# Patient Record
Sex: Female | Born: 1965
Health system: Southern US, Community
[De-identification: ages and names within clinical notes are randomized; demographics above are authoritative.]

## PROBLEM LIST (undated history)

## (undated) DIAGNOSIS — F419 Anxiety disorder, unspecified: Secondary | ICD-10-CM

## (undated) DIAGNOSIS — R51 Headache: Secondary | ICD-10-CM

## (undated) DIAGNOSIS — Z8719 Personal history of other diseases of the digestive system: Secondary | ICD-10-CM

## (undated) DIAGNOSIS — M797 Fibromyalgia: Secondary | ICD-10-CM

## (undated) DIAGNOSIS — F1190 Opioid use, unspecified, uncomplicated: Secondary | ICD-10-CM

## (undated) DIAGNOSIS — L02214 Cutaneous abscess of groin: Secondary | ICD-10-CM

## (undated) DIAGNOSIS — M1990 Unspecified osteoarthritis, unspecified site: Secondary | ICD-10-CM

## (undated) DIAGNOSIS — K429 Umbilical hernia without obstruction or gangrene: Secondary | ICD-10-CM

## (undated) DIAGNOSIS — F17200 Nicotine dependence, unspecified, uncomplicated: Secondary | ICD-10-CM

## (undated) DIAGNOSIS — E739 Lactose intolerance, unspecified: Secondary | ICD-10-CM

## (undated) DIAGNOSIS — E7800 Pure hypercholesterolemia, unspecified: Secondary | ICD-10-CM

## (undated) DIAGNOSIS — M549 Dorsalgia, unspecified: Secondary | ICD-10-CM

## (undated) DIAGNOSIS — G8929 Other chronic pain: Secondary | ICD-10-CM

## (undated) DIAGNOSIS — I10 Essential (primary) hypertension: Secondary | ICD-10-CM

## (undated) DIAGNOSIS — T4145XA Adverse effect of unspecified anesthetic, initial encounter: Secondary | ICD-10-CM

## (undated) DIAGNOSIS — K589 Irritable bowel syndrome without diarrhea: Secondary | ICD-10-CM

## (undated) DIAGNOSIS — N6310 Unspecified lump in the right breast, unspecified quadrant: Secondary | ICD-10-CM

## (undated) DIAGNOSIS — E669 Obesity, unspecified: Secondary | ICD-10-CM

## (undated) HISTORY — PX: ESOPHAGOGASTRODUODENOSCOPY: SHX1529

## (undated) HISTORY — PX: BACK SURGERY: SHX140

## (undated) HISTORY — DX: Headache: R51

## (undated) HISTORY — DX: Unspecified osteoarthritis, unspecified site: M19.90

## (undated) HISTORY — DX: Nicotine dependence, unspecified, uncomplicated: F17.200

## (undated) HISTORY — PX: LUMBAR DISC SURGERY: SHX700

## (undated) HISTORY — PX: OTHER SURGICAL HISTORY: SHX169

## (undated) HISTORY — DX: Unspecified lump in the right breast, unspecified quadrant: N63.10

## (undated) HISTORY — DX: Essential (primary) hypertension: I10

## (undated) HISTORY — PX: TUBAL LIGATION: SHX77

## (undated) HISTORY — PX: BREAST EXCISIONAL BIOPSY: SUR124

## (undated) HISTORY — PX: FINGER SURGERY: SHX640

## (undated) HISTORY — DX: Irritable bowel syndrome, unspecified: K58.9

## (undated) HISTORY — PX: ABDOMINAL HYSTERECTOMY: SHX81

## (undated) HISTORY — PX: FOOT SURGERY: SHX648

## (undated) HISTORY — DX: Pure hypercholesterolemia, unspecified: E78.00

## (undated) HISTORY — DX: Obesity, unspecified: E66.9

## (undated) HISTORY — DX: Cutaneous abscess of groin: L02.214

## (undated) HISTORY — DX: Dorsalgia, unspecified: M54.9

## (undated) HISTORY — PX: BREAST LUMPECTOMY: SHX2

## (undated) HISTORY — DX: Anxiety disorder, unspecified: F41.9

---

## 1998-06-23 ENCOUNTER — Emergency Department (HOSPITAL_COMMUNITY): Admission: EM | Admit: 1998-06-23 | Discharge: 1998-06-23 | Payer: Self-pay | Admitting: Emergency Medicine

## 1998-10-20 ENCOUNTER — Other Ambulatory Visit: Admission: RE | Admit: 1998-10-20 | Discharge: 1998-10-20 | Payer: Self-pay | Admitting: Obstetrics & Gynecology

## 2001-01-06 ENCOUNTER — Other Ambulatory Visit: Admission: RE | Admit: 2001-01-06 | Discharge: 2001-01-06 | Payer: Self-pay | Admitting: Obstetrics and Gynecology

## 2001-11-11 ENCOUNTER — Ambulatory Visit (HOSPITAL_COMMUNITY): Admission: RE | Admit: 2001-11-11 | Discharge: 2001-11-11 | Payer: Self-pay | Admitting: Pulmonary Disease

## 2001-11-11 ENCOUNTER — Encounter: Payer: Self-pay | Admitting: Pulmonary Disease

## 2002-02-08 ENCOUNTER — Observation Stay (HOSPITAL_COMMUNITY): Admission: EM | Admit: 2002-02-08 | Discharge: 2002-02-09 | Payer: Self-pay | Admitting: Emergency Medicine

## 2002-02-08 ENCOUNTER — Encounter (INDEPENDENT_AMBULATORY_CARE_PROVIDER_SITE_OTHER): Payer: Self-pay

## 2003-05-04 ENCOUNTER — Encounter: Payer: Self-pay | Admitting: Obstetrics and Gynecology

## 2003-05-04 ENCOUNTER — Encounter: Admission: RE | Admit: 2003-05-04 | Discharge: 2003-05-04 | Payer: Self-pay | Admitting: Obstetrics and Gynecology

## 2004-12-30 ENCOUNTER — Emergency Department (HOSPITAL_COMMUNITY): Admission: EM | Admit: 2004-12-30 | Discharge: 2004-12-30 | Payer: Self-pay | Admitting: Emergency Medicine

## 2005-01-04 ENCOUNTER — Encounter (INDEPENDENT_AMBULATORY_CARE_PROVIDER_SITE_OTHER): Payer: Self-pay | Admitting: *Deleted

## 2005-01-04 ENCOUNTER — Ambulatory Visit (HOSPITAL_BASED_OUTPATIENT_CLINIC_OR_DEPARTMENT_OTHER): Admission: RE | Admit: 2005-01-04 | Discharge: 2005-01-04 | Payer: Self-pay | Admitting: *Deleted

## 2005-01-04 ENCOUNTER — Ambulatory Visit (HOSPITAL_COMMUNITY): Admission: RE | Admit: 2005-01-04 | Discharge: 2005-01-04 | Payer: Self-pay | Admitting: *Deleted

## 2005-02-01 ENCOUNTER — Ambulatory Visit: Payer: Self-pay | Admitting: Infectious Diseases

## 2005-02-01 ENCOUNTER — Ambulatory Visit (HOSPITAL_COMMUNITY): Admission: RE | Admit: 2005-02-01 | Discharge: 2005-02-01 | Payer: Self-pay | Admitting: Infectious Diseases

## 2005-02-20 ENCOUNTER — Ambulatory Visit: Payer: Self-pay | Admitting: Infectious Diseases

## 2005-03-09 ENCOUNTER — Emergency Department (HOSPITAL_COMMUNITY): Admission: EM | Admit: 2005-03-09 | Discharge: 2005-03-09 | Payer: Self-pay | Admitting: Emergency Medicine

## 2005-07-03 ENCOUNTER — Ambulatory Visit: Payer: Self-pay | Admitting: Pulmonary Disease

## 2005-10-18 ENCOUNTER — Ambulatory Visit: Payer: Self-pay | Admitting: Pulmonary Disease

## 2006-07-21 ENCOUNTER — Other Ambulatory Visit: Admission: RE | Admit: 2006-07-21 | Discharge: 2006-07-21 | Payer: Self-pay | Admitting: *Deleted

## 2007-05-04 ENCOUNTER — Ambulatory Visit: Payer: Self-pay | Admitting: Pulmonary Disease

## 2007-05-04 LAB — CONVERTED CEMR LAB
ALT: 18 units/L (ref 0–35)
AST: 22 units/L (ref 0–37)
Albumin: 3.8 g/dL (ref 3.5–5.2)
Basophils Absolute: 0 10*3/uL (ref 0.0–0.1)
Basophils Relative: 0.6 % (ref 0.0–1.0)
Creatinine, Ser: 0.8 mg/dL (ref 0.4–1.2)
Crystals: NEGATIVE
Eosinophils Absolute: 0.2 10*3/uL (ref 0.0–0.6)
Eosinophils Relative: 2.1 % (ref 0.0–5.0)
GFR calc Af Amer: 102 mL/min
HDL: 65.7 mg/dL (ref >39.0)
Hemoglobin, Urine: NEGATIVE
Hemoglobin: 13.3 g/dL (ref 12.0–15.0)
Ketones, ur: NEGATIVE mg/dL
Lymphocytes Relative: 24 % (ref 12.0–46.0)
Neutro Abs: 5.1 10*3/uL (ref 1.4–7.7)
Potassium: 4.2 meq/L (ref 3.5–5.1)
RBC / HPF: NONE SEEN
Sodium: 141 meq/L (ref 135–145)
Total CHOL/HDL Ratio: 3.7
Triglycerides: 56 mg/dL (ref 0–149)
Urine Glucose: NEGATIVE mg/dL
Urobilinogen, UA: 0.2 (ref 0.0–1.0)
pH: 7 (ref 5.0–8.0)

## 2007-07-12 ENCOUNTER — Emergency Department (HOSPITAL_COMMUNITY): Admission: EM | Admit: 2007-07-12 | Discharge: 2007-07-12 | Payer: Self-pay | Admitting: Emergency Medicine

## 2007-08-03 DIAGNOSIS — E669 Obesity, unspecified: Secondary | ICD-10-CM | POA: Insufficient documentation

## 2007-08-03 DIAGNOSIS — F411 Generalized anxiety disorder: Secondary | ICD-10-CM | POA: Insufficient documentation

## 2007-12-03 ENCOUNTER — Encounter: Payer: Self-pay | Admitting: Pulmonary Disease

## 2007-12-03 ENCOUNTER — Emergency Department (HOSPITAL_COMMUNITY): Admission: EM | Admit: 2007-12-03 | Discharge: 2007-12-03 | Payer: Self-pay | Admitting: Emergency Medicine

## 2007-12-07 ENCOUNTER — Ambulatory Visit: Payer: Self-pay | Admitting: Pulmonary Disease

## 2007-12-07 DIAGNOSIS — E7800 Pure hypercholesterolemia, unspecified: Secondary | ICD-10-CM | POA: Insufficient documentation

## 2007-12-07 DIAGNOSIS — R51 Headache: Secondary | ICD-10-CM | POA: Insufficient documentation

## 2007-12-07 DIAGNOSIS — R519 Headache, unspecified: Secondary | ICD-10-CM | POA: Insufficient documentation

## 2007-12-07 DIAGNOSIS — M1990 Unspecified osteoarthritis, unspecified site: Secondary | ICD-10-CM | POA: Insufficient documentation

## 2007-12-07 DIAGNOSIS — M5440 Lumbago with sciatica, unspecified side: Secondary | ICD-10-CM | POA: Insufficient documentation

## 2007-12-07 DIAGNOSIS — F17200 Nicotine dependence, unspecified, uncomplicated: Secondary | ICD-10-CM

## 2007-12-09 ENCOUNTER — Encounter: Admission: RE | Admit: 2007-12-09 | Discharge: 2007-12-09 | Payer: Self-pay | Admitting: Orthopedic Surgery

## 2007-12-10 ENCOUNTER — Encounter: Payer: Self-pay | Admitting: Pulmonary Disease

## 2007-12-17 ENCOUNTER — Ambulatory Visit (HOSPITAL_COMMUNITY): Admission: RE | Admit: 2007-12-17 | Discharge: 2007-12-18 | Payer: Self-pay | Admitting: Specialist

## 2008-01-21 ENCOUNTER — Ambulatory Visit (HOSPITAL_BASED_OUTPATIENT_CLINIC_OR_DEPARTMENT_OTHER): Admission: RE | Admit: 2008-01-21 | Discharge: 2008-01-21 | Payer: Self-pay | Admitting: Specialist

## 2008-01-28 ENCOUNTER — Encounter: Admission: RE | Admit: 2008-01-28 | Discharge: 2008-04-27 | Payer: Self-pay | Admitting: Specialist

## 2008-02-16 ENCOUNTER — Other Ambulatory Visit: Admission: RE | Admit: 2008-02-16 | Discharge: 2008-02-16 | Payer: Self-pay | Admitting: Gynecology

## 2008-11-07 ENCOUNTER — Telehealth (INDEPENDENT_AMBULATORY_CARE_PROVIDER_SITE_OTHER): Payer: Self-pay | Admitting: *Deleted

## 2008-12-05 ENCOUNTER — Ambulatory Visit: Payer: Self-pay | Admitting: Pulmonary Disease

## 2008-12-09 ENCOUNTER — Encounter: Admission: RE | Admit: 2008-12-09 | Discharge: 2008-12-09 | Payer: Self-pay | Admitting: Pulmonary Disease

## 2008-12-13 ENCOUNTER — Encounter: Payer: Self-pay | Admitting: Pulmonary Disease

## 2008-12-16 ENCOUNTER — Encounter: Admission: RE | Admit: 2008-12-16 | Discharge: 2008-12-16 | Payer: Self-pay | Admitting: Pulmonary Disease

## 2008-12-19 ENCOUNTER — Encounter: Payer: Self-pay | Admitting: Pulmonary Disease

## 2008-12-19 ENCOUNTER — Encounter: Admission: RE | Admit: 2008-12-19 | Discharge: 2008-12-19 | Payer: Self-pay | Admitting: Pulmonary Disease

## 2008-12-19 LAB — CONVERTED CEMR LAB
AST: 29 units/L (ref 0–37)
Albumin: 3.9 g/dL (ref 3.5–5.2)
Alkaline Phosphatase: 67 units/L (ref 39–117)
Basophils Absolute: 0 10*3/uL (ref 0.0–0.1)
Bilirubin, Direct: 0.1 mg/dL (ref 0.0–0.3)
CO2: 31 meq/L (ref 19–32)
Calcium: 9.1 mg/dL (ref 8.4–10.5)
Chloride: 103 meq/L (ref 96–112)
Cholesterol: 247 mg/dL — ABNORMAL HIGH (ref 0–200)
Creatinine, Ser: 0.8 mg/dL (ref 0.4–1.2)
Direct LDL: 165.9 mg/dL
Eosinophils Absolute: 0.2 10*3/uL (ref 0.0–0.7)
Eosinophils Relative: 2.6 % (ref 0.0–5.0)
HCT: 40.7 % (ref 36.0–46.0)
Monocytes Relative: 7.7 % (ref 3.0–12.0)
Neutrophils Relative %: 58.5 % (ref 43.0–77.0)
Platelets: 264 10*3/uL (ref 150.0–400.0)
Potassium: 4.2 meq/L (ref 3.5–5.1)
RBC: 4.71 M/uL (ref 3.87–5.11)
Sodium: 142 meq/L (ref 135–145)
Total Bilirubin: 0.7 mg/dL (ref 0.3–1.2)
Total Protein: 6.9 g/dL (ref 6.0–8.3)
Triglycerides: 52 mg/dL (ref 0.0–149.0)
WBC: 7.5 10*3/uL (ref 4.5–10.5)

## 2009-12-22 ENCOUNTER — Encounter: Admission: RE | Admit: 2009-12-22 | Discharge: 2009-12-22 | Payer: Self-pay | Admitting: Pulmonary Disease

## 2009-12-29 ENCOUNTER — Encounter: Admission: RE | Admit: 2009-12-29 | Discharge: 2009-12-29 | Payer: Self-pay | Admitting: Pulmonary Disease

## 2009-12-29 ENCOUNTER — Encounter: Payer: Self-pay | Admitting: Pulmonary Disease

## 2010-01-12 ENCOUNTER — Ambulatory Visit: Payer: Self-pay | Admitting: Pulmonary Disease

## 2010-01-12 DIAGNOSIS — N63 Unspecified lump in unspecified breast: Secondary | ICD-10-CM

## 2010-01-13 LAB — CONVERTED CEMR LAB
ALT: 20 units/L (ref 0–35)
Albumin: 4 g/dL (ref 3.5–5.2)
BUN: 7 mg/dL (ref 6–23)
Basophils Relative: 0.7 % (ref 0.0–3.0)
Bilirubin Urine: NEGATIVE
CO2: 29 meq/L (ref 19–32)
Calcium: 8.6 mg/dL (ref 8.4–10.5)
Creatinine, Ser: 0.7 mg/dL (ref 0.4–1.2)
Eosinophils Relative: 3.2 % (ref 0.0–5.0)
HCT: 39.3 % (ref 36.0–46.0)
HDL: 75.7 mg/dL (ref >39.00)
Ketones, ur: NEGATIVE mg/dL
MCHC: 33.5 g/dL (ref 30.0–36.0)
Monocytes Absolute: 0.6 10*3/uL (ref 0.1–1.0)
Monocytes Relative: 7.8 % (ref 3.0–12.0)
Neutro Abs: 4.4 10*3/uL (ref 1.4–7.7)
Sodium: 145 meq/L (ref 135–145)
Total Protein, Urine: NEGATIVE mg/dL
Triglycerides: 50 mg/dL (ref 0.0–149.0)
Urine Glucose: NEGATIVE mg/dL

## 2010-01-15 ENCOUNTER — Encounter: Payer: Self-pay | Admitting: Pulmonary Disease

## 2010-01-22 ENCOUNTER — Telehealth: Payer: Self-pay | Admitting: Pulmonary Disease

## 2010-02-13 ENCOUNTER — Encounter: Admission: RE | Admit: 2010-02-13 | Discharge: 2010-02-13 | Payer: Self-pay | Admitting: General Surgery

## 2010-02-16 ENCOUNTER — Ambulatory Visit (HOSPITAL_BASED_OUTPATIENT_CLINIC_OR_DEPARTMENT_OTHER): Admission: RE | Admit: 2010-02-16 | Discharge: 2010-02-16 | Payer: Self-pay | Admitting: General Surgery

## 2010-02-16 ENCOUNTER — Encounter: Admission: RE | Admit: 2010-02-16 | Discharge: 2010-02-16 | Payer: Self-pay | Admitting: General Surgery

## 2010-02-16 ENCOUNTER — Encounter: Payer: Self-pay | Admitting: Pulmonary Disease

## 2010-03-05 ENCOUNTER — Encounter: Payer: Self-pay | Admitting: Pulmonary Disease

## 2010-09-13 ENCOUNTER — Telehealth: Payer: Self-pay | Admitting: Pulmonary Disease

## 2010-10-18 NOTE — Assessment & Plan Note (Signed)
Summary: cpx/ mbw   CC:  1 year ROV & CPX....  History of Present Illness: 45 y/o BF here for a follow up visit...   ~  ER visit 12/03/07 for acute left flank pain- seen by DrAllen... L Flank Pain x 1day, mod-severe, exam- neg;  labs- norm CBC, norm Chems, Upreg=neg, UA + wbc, bact & +nitrite;  CTAbd & Pelvis was negative... Dana [name].. therefore Dx w/ pyeloneph and Rx w/ dilaudid & zofran in ER... disch on CIPRO...  ~  seen here 12/07/07 w/ pain is no better and, in fact, is more in the left side of her back and is assoc w/ pain and numbness in her left leg... no know injury, no hx of back problems... referred to Ortho- seen by DrDean w/ ?radiculopathy... MRI done w/ L3 disc herniation- s/p lumbar L2-3 microdiscectomy 12/17/07 by Susann Givens and improved...   ~  Mar10:  yearly check up and CPX- doing well, no new complaints or concerns... she never took the BP meds perscribed in 2009 (Lisinopril/Hct), preferring to treat this w/ diet alone...   ~  January 12, 2010:  Yearly ROV & CPX> recent stress w/ f/u mammogram showing enlarging lesion right breast... 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 f/u mammogram 4/11 w/ 1.6 x 1cm lesion w/ lobulation... she is referred to [facility] & awaiting consult for excision 5/2 DrRosenbower... she has lost 20# this yr on diet & walking... BP still borderline controlled on diet alone (she refused meds) w/ BP 130/90 today... she was started on Prav40 for her Chol last yr but she stopped it on her own 2 mo ago... still smoking several cigs per day & not motivated to quit.    Current Problems:  CIGARETTE SMOKER (ICD-305.1) - still smokes several cigarettes per day... we discussed smoking cessation strategies and offered counselling, patches, Chantix, etc... she declines smoking cessation help & does not appear motivated to quit.  HYPERTENSION, MILD (ICD-401.1) - borderline HBP- ?related to her weight... we perscribed LISINOPRIL/Hct  20/12.5 Aug08 but she didn't follow up and stopped this on her own... she prefers to treat this without meds & we discussed diet + exercise needed...  BP=130/90 today, denies HA, fatigue, visual changes, CP, palipit, dizziness, syncope, dyspnea, edema, etc... pt advised to get a BP cuff and start monitoring BP at home, get on diet- 2 gm sodium, 1200 cal/d weight reducing diet, and incease exercise program...  ~  4/11:  BP still  ~130/90 despite 20# wt reduction over the last yr... she declines medRx.  HYPERCHOLESTEROLEMIA (ICD-272.0) - she refused to fill Simva Rx in past but started PRAVACHOL 40mg  ($4 list) 3/10.  ~  FLP 8/08 showed TChol 244, TG 56, HDL 66, LDL 168- Simvastatin recommended, but pt refused.  ~  FLP 3/10 showed TChol 247, TG 52, HDL 71, LDL 166... rec> try Pravastatin40 + diet & exercise.  ~  FLP 4/11 off Prav40 x67mo showed TChol 240, TG 50, HDL 76, LDL158... rec> get bck on Prav40!  OBESITY (ICD-278.00) - diet/ exercise discussed w/ pt (again)... she refuses to consider bariatric approach... in her 5\' 4"  frame her BMI has been around 40...  ~  weight 2/07 = 228#  ~  weight 8/08 = 237#  ~  weight 3/09 = 243#  ~  weight 3/10 = 240#  ~  weight 4/11 = 220#... keep up the good work!  BREAST MASS, RIGHT (ICD-611.72) *** see above ***  DEGENERATIVE JOINT DISEASE (ICD-715.90) BACK PAIN WITH RADICULOPATHY (ICD-729.2) - presented 3/09 w/ left sided back pain into left leg... eval by DrDean & DrNitka w/  L2-3 microdiscectomy 4/09 by Susann Givens...  Hx of HEADACHE (ICD-784.0)  ANXIETY (ICD-300.00) - on ALPRAZOLAM 0.5mg  Tid Prn...   Allergies: 1)  ! Penicillin  Comments:  Nurse/Medical Assistant: The patient's medications and allergies were reviewed with the patient and were updated in the Medication and Allergy Lists.  Past History:  Past Medical History: CIGARETTE SMOKER (ICD-305.1) HYPERTENSION, MILD (ICD-401.1) HYPERCHOLESTEROLEMIA (ICD-272.0) OBESITY (ICD-278.00) BREAST  MASS, RIGHT (ICD-611.72) DEGENERATIVE JOINT DISEASE (ICD-715.90) BACK PAIN WITH RADICULOPATHY (ICD-729.2) Hx of HEADACHE (ICD-784.0) ANXIETY (ICD-300.00)  Past Surgical History: S/P BTL S/P drainage of right groin abscess 5/03 by DrWeatherly S/P excision of mass from right index finger 4/06 by DrMyerdirks S/P L2-3 microdiscectomy 4/09 by Susann Givens  Family History: mother alive and well father alive and hx of DM 4 siblings all alive and well  Social History: current smoker  <1PPD exercise at least 2 times per week no caffeine use married 4 children Employ: Psychologist, educational  Review of Systems       The patient complains of anxiety.  The patient denies fever, chills, sweats, anorexia, fatigue, weakness, malaise, weight loss, sleep disorder, blurring, diplopia, eye irritation, eye discharge, vision loss, eye pain, photophobia, earache, ear discharge, tinnitus, decreased hearing, nasal congestion, nosebleeds, sore throat, hoarseness, chest pain, palpitations, syncope, dyspnea on exertion, orthopnea, PND, peripheral edema, cough, dyspnea at rest, excessive sputum, hemoptysis, wheezing, pleurisy, nausea, vomiting, diarrhea, constipation, change in bowel habits, abdominal pain, melena, hematochezia, jaundice, gas/bloating, indigestion/heartburn, dysphagia, odynophagia, dysuria, hematuria, urinary frequency, urinary hesitancy, nocturia, incontinence, back pain, joint pain, joint swelling, muscle cramps, muscle weakness, stiffness, arthritis, sciatica, restless legs, leg pain at night, leg pain with exertion, rash, itching, dryness, suspicious lesions, paralysis, paresthesias, seizures, tremors, vertigo, transient blindness, frequent falls, frequent headaches, difficulty walking, depression, memory loss, confusion, cold intolerance, heat intolerance, polydipsia, polyphagia, polyuria, unusual weight change, abnormal bruising, bleeding, enlarged lymph nodes, urticaria, allergic rash, hay fever, and  recurrent infections.    Vital Signs:  Patient profile:   45 year old female Height:      62 inches Weight:      220.13 pounds BMI:     40.41 O2 Sat:      99 % on Room air Temp:     99.7 degrees F oral Pulse rate:   70 / minute BP sitting:   130 / 90  (left arm) Cuff size:   regular  Vitals Entered By: Randell Loop CMA (January 12, 2010 11:08 AM)  O2 Sat at Rest %:  99 O2 Flow:  Room air CC: 1 year ROV & CPX... Is Patient Diabetic? No Pain Assessment Patient in pain? yes      Onset of pain  at itmes her bones ache Comments no changes in meds   Physical Exam  Additional Exam:  WD, Obese, 45 y/o BF in NAD... GENERAL:  Alert & oriented; pleasant & cooperative... HEENT:  [place]/AT, EOM-wnl, PERRLA, EACs-clear, TMs-wnl, NOSE-clear, THROAT-clear & wnl. NECK:  Supple w/ fairROM; no JVD; normal carotid impulses w/o bruits; no thyromegaly or nodules palpated; no lymphadenopathy. CHEST:  Clear to P & A; without wheezes/ rales/ or rhonchi. HEART:  Regular Rhythm; without murmurs/ rubs/ or gallops. ABDOMEN:  Obese, soft & nontender; normal bowel sounds; no organomegaly or masses detected. EXT: without deformities, mild arthritic changes; no varicose veins/ +venous insuffic/ no edema. NEURO:  CN's intact;  no focal neuro deficits... DERM:  No lesions noted; no rash etc...    CXR  Procedure date:  01/12/2010  Findings:      CHEST - 2 VIEW Comparison: Chest x-ray of 12/05/2008   Findings: The lungs are clear.  Mediastinal contours are stable. The heart is within normal limits in size.  No acute bony abnormality is seen with only mild degenerative change in the thoracic spine.   IMPRESSION: Stable chest x-ray.  No active lung disease.   Read By:  Juline Patch,  M.D.   EKG  Procedure date:  01/12/2010  Findings:      Normal sinus rhythm with rate of:  64/min... Tracing IVCD, NSSTTWA, NAD...  SN   MISC. Report  Procedure date:  01/12/2010  Findings:      BMP  (METABOL)   Sodium                    145 mEq/L                   135-145   Potassium                 4.3 mEq/L                   3.5-5.1   Chloride                  110 mEq/L                   96-112   Carbon Dioxide            29 mEq/L                    19-32   Glucose                   77 mg/dL                    11-91   BUN                       7 mg/dL                     4-78   Creatinine                0.7 mg/dL                   2.9-5.6   Calcium                   8.6 mg/dL                   2.1-30.8   GFR                       116.72 mL/min               >60  Hepatic/Liver Function Panel (HEPATIC)   Total Bilirubin           0.4 mg/dL                   6.5-7.8   Direct Bilirubin          0.1 mg/dL                   4.6-9.6   Alkaline Phosphatase  62 U/L                      39-117   AST                       22 U/L                      0-37   ALT                       20 U/L                      0-35   Total Protein             6.8 g/dL                    1.6-1.0   Albumin                   4.0 g/dL                    9.6-0.4  CBC Platelet w/Diff (CBCD)   White Cell Count          7.3 K/uL                    4.5-10.5   Red Cell Count            4.44 Mil/uL                 3.87-5.11   Hemoglobin                13.1 g/dL                   54.0-98.1   Hematocrit                39.3 %                      36.0-46.0   MCV                       88.4 fl                     78.0-100.0   Platelet Count            251.0 K/uL                  150.0-400.0   Neutrophil %              60.8 %                      43.0-77.0   Lymphocyte %              27.5 %                      12.0-46.0   Monocyte %                7.8 %                       3.0-12.0   Eosinophils%              3.2 %  0.0-5.0   Basophils %               0.7 %                       0.0-3.0  Comments:      Lipid Panel (LIPID)   Cholesterol          [H]  240 mg/dL                   8-119    Triglycerides             50.0 mg/dL                  1.4-782.9   HDL                       56.21 mg/dL                 >30.86 Cholesterol LDL - Direct                             158.2 mg/dL  TSH (TSH)   FastTSH                   0.89 uIU/mL                 0.35-5.50  UDip w/Micro (URINE)   Color                     LT. YELLOW   Clarity                   CLEAR                       Clear   Specific Gravity          >=1.030                     1.000 - 1.030   Urine Ph                  5.5                         5.0-8.0   Protein                   NEGATIVE                    Negative   Urine Glucose             NEGATIVE                    Negative   Ketones                   NEGATIVE                    Negative   Urine Bilirubin           NEGATIVE                    Negative   Blood                     NEGATIVE  Negative   Urobilinogen              0.2                         0.0 - 1.0   Leukocyte Esterace        NEGATIVE                    Negative   Nitrite                   NEGATIVE                    Negative   Urine WBC                 0-2/hpf                     0-2/hpf   Urine RBC                 0-2/hpf                     0-2/hpf   Urine Mucus               Presence of                 None   Urine Epith               Few(5-10/hpf)               Rare(0-4/hpf)  Impression & Recommendations:  Problem # 1:  BREAST MASS, RIGHT (ICD-611.72) This is the most concerning issue at present>  consult from [facility]- DrRosenbower 01/15/10. Her CXR is clear- NAD...  Problem # 2:  CIGARETTE SMOKER (ICD-305.1) We discussed smoking cessation strategies including cessation programs, counselling, nicotine replacement, and Chantix receptor blockade... the pt is not interested at this time but we left the door open should she like to reconsider at any time.   Problem # 3:  HYPERTENSION, MILD (ICD-401.1) She has borderline HBP>  she has restricted sodium, & lost some weight... she is  not inclined to take Rx for BP at this time... advised continue diet, exercise, get weight down...  Problem # 4:  HYPERCHOLESTEROLEMIA (ICD-272.0) She stopped her Prav40... this FLP is another baseline value & she is rec to restart the Prav40 & stay on this w/ f/u FLP on the medication. Her updated medication list for this problem includes:    Pravastatin Sodium 40 Mg Tabs (Pravastatin sodium) .Dana [name]... Take 1 tab by mouth at bedtime...  Problem # 5:  OBESITY (ICD-278.00) She has lost 20#>  keep up the good work...  Problem # 6:  ANXIETY (ICD-300.00) We will refill her Alpraz Rx... Her updated medication list for this problem includes:    Alprazolam 0.5 Mg Tabs (Alprazolam) .Dana [name]... 1 tab by mouth three times a day as needed for nerves...  Problem # 7:  OTHER MEDICAL PROBLEMS AS NOTED>>>  Complete Medication List: 1)  Pravastatin Sodium 40 Mg Tabs (Pravastatin sodium) .... Take 1 tab by mouth at bedtime.Dana KitchenMarland [name] 2)  Alprazolam 0.5 Mg Tabs (Alprazolam) .Dana [name].. 1 tab by mouth three times a day as needed for nerves...  Other Orders: T-2 View CXR (71020TC) 12 Lead EKG (12 Lead EKG) TLB-BMP (Basic Metabolic Panel-BMET) (80048-METABOL) TLB-Hepatic/Liver Function Pnl (80076-HEPATIC) TLB-CBC Platelet - w/Differential (85025-CBCD) TLB-Lipid Panel (80061-LIPID) TLB-TSH (Thyroid Stimulating Hormone) (84443-TSH) TLB-Udip  w/ Micro (81001-URINE)  Patient Instructions: 1)  Today we updated your med list- see below.... 2)  We refilled your meds for 2011... 3)  Today we did your follow up CXR, EKG, Fasting blood work... please call the "phone tree" in a few days for your lab results.Dana KitchenMarland [name] 4)  Good luck on the up-coming surgical eval... 5)  Keep up the good work w/ weight reduction, and try to quit the last of those nasty cigarettes.Dana KitchenMarland [name] 6)  Call for any questions.Dana KitchenMarland [name] 7)  Please schedule a follow-up appointment in 1 year, sooner as needed. Prescriptions: ALPRAZOLAM 0.5 MG  TABS (ALPRAZOLAM) 1 tab by mouth three  times a day as needed for nerves...  #90 x 6   Entered and Authorized by:   Michele Mcalpine MD   Signed by:   Michele Mcalpine MD on 01/12/2010   Method used:   Print then Give to Patient   RxID:   [id number] PRAVASTATIN SODIUM 40 MG TABS (PRAVASTATIN SODIUM) take 1 tab by mouth at bedtime...  #90 x prn   Entered and Authorized by:   Michele Mcalpine MD   Signed by:   Michele Mcalpine MD on 01/12/2010   Method used:   Print then Give to Patient   RxID:   [id number]     CardioPerfect ECG  ID: [id number] [name], Dana [name] DOB: [birth year] Age: 45 Years Old Sex: Female Race: Black Physician: Oronde Hallenbeck Technician: Randell Loop CMA Height: 62 Weight: 220.13 Status: Unconfirmed Past Medical History:  CIGARETTE SMOKER (ICD-305.1) HYPERTENSION, MILD (ICD-401.1) HYPERCHOLESTEROLEMIA (ICD-272.0) OBESITY (ICD-278.00) DEGENERATIVE JOINT DISEASE (ICD-715.90) BACK PAIN WITH RADICULOPATHY (ICD-729.2) Hx of HEADACHE (ICD-784.0) ANXIETY (ICD-300.00)   Recorded: 01/12/2010 11:23 AM P/PR: 102 ms / 170 ms - Heart rate (maximum exercise) QRS: 82 QT/QTc/QTd: 420 ms / 425 ms / 32 ms - Heart rate (maximum exercise)  P/QRS/T axis: 37 deg / 77 deg / 30 deg - Heart rate (maximum exercise)  Heartrate: 63 bpm  Interpretation:  Normal sinus rhythm with rate of:  64/min... Tracing IVCD, NSSTTWA, NAD...  SN

## 2010-10-18 NOTE — Letter (Signed)
Summary: [facility]   Imported By: Lester  04/18/2010 07:32:15  _____________________________________________________________________  External Attachment:    Type:   Image     Comment:   External Document

## 2010-10-18 NOTE — Letter (Signed)
Summary: [facility]   Imported By: Sherian Rein 02/15/2010 14:50:52  _____________________________________________________________________  External Attachment:    Type:   Image     Comment:   External Document

## 2010-10-18 NOTE — Progress Notes (Signed)
Summary: Sick   Phone Note Call from Patient Call back at [phone]   Caller: Patient Call For: Kriste Basque Summary of Call: Pt c/o Sore throat, ear ache, watery eyes with "mucus like" discharge in the mornings stuffy nose, nausea, bodyaches, sweats, productive cough with thin green mucus starting Sat while at the beach. Pt denies fever, v/d/s. Pt requesting medication called in.  [facility] [address] Initial call taken by: Zackery Barefoot CMA,  September 13, 2010 12:02 PM  Follow-up for Phone Call        per SN---can have zpak #1  take as directed  and use mucinex 600mg   2 by mouth two times a day with plenty of fluids and cough drops and nasal saline spray as needed  called and spoke with pt  and she is aware of recs per SN .Randell Loop CMA  September 13, 2010 1:45 PM     New/Updated Medications: ZITHROMAX Z-PAK 250 MG TABS (AZITHROMYCIN) take as directed Prescriptions: ZITHROMAX Z-PAK 250 MG TABS (AZITHROMYCIN) take as directed  #1 x 0   Entered by:   Randell Loop CMA   Authorized by:   Michele Mcalpine MD   Signed by:   Randell Loop CMA on 09/13/2010   Method used:   [facility] to        [facility] [phone]* (retail)       [address]       [place], [place]  [address]       Ph: [phone] or [phone]       Fax: [phone]   RxID:   [id number]

## 2010-10-18 NOTE — Progress Notes (Signed)
Summary: results  Phone Note Call from Patient Call back at [phone]   Caller: Patient Call For: Celestine Bougie Summary of Call: calling to get lab results Initial call taken by: Rickard Patience,  Jan 22, 2010 10:12 AM  Follow-up for Phone Call        [facility]. results recorded on phone tree. Carron Curie CMA  Jan 22, 2010 10:44 AM  LMOMTCBx2.  Aundra Millet Reynolds LPN  Jan 23, 2010 9:00 AM   LMTCBx3.Carron Curie CMA  Jan 24, 2010 9:47 AM  per protocol will sign off an await pt to return call. Carron Curie CMA  Jan 25, 2010 11:14 AM

## 2010-11-16 ENCOUNTER — Telehealth (INDEPENDENT_AMBULATORY_CARE_PROVIDER_SITE_OTHER): Payer: Self-pay | Admitting: *Deleted

## 2010-11-16 ENCOUNTER — Other Ambulatory Visit: Payer: Self-pay | Admitting: Pulmonary Disease

## 2010-11-25 ENCOUNTER — Emergency Department (HOSPITAL_COMMUNITY)
Admission: EM | Admit: 2010-11-25 | Discharge: 2010-11-25 | Disposition: A | Discharge status: Home / Self Care | Payer: 59 | Attending: Emergency Medicine | Admitting: Emergency Medicine

## 2010-11-25 ENCOUNTER — Emergency Department (HOSPITAL_COMMUNITY): Payer: 59

## 2010-11-25 DIAGNOSIS — R109 Unspecified abdominal pain: Secondary | ICD-10-CM | POA: Insufficient documentation

## 2010-11-25 DIAGNOSIS — I10 Essential (primary) hypertension: Secondary | ICD-10-CM | POA: Insufficient documentation

## 2010-11-25 LAB — POCT I-STAT, CHEM 8
BUN: 9 mg/dL (ref 6–23)
Glucose, Bld: 81 mg/dL (ref 70–99)
HCT: 43 % (ref 36.0–46.0)
Potassium: 4.3 mEq/L (ref 3.5–5.1)
TCO2: 24 mmol/L (ref 0–100)

## 2010-11-25 LAB — URINE MICROSCOPIC-ADD ON

## 2010-11-25 LAB — WET PREP, GENITAL
Trich, Wet Prep: NONE SEEN
Yeast Wet Prep HPF POC: NONE SEEN

## 2010-11-25 LAB — URINALYSIS, ROUTINE W REFLEX MICROSCOPIC: Nitrite: NEGATIVE

## 2010-11-25 LAB — POCT PREGNANCY, URINE: Preg Test, Ur: NEGATIVE

## 2010-11-27 LAB — GC/CHLAMYDIA PROBE AMP, GENITAL
Chlamydia, DNA Probe: NEGATIVE
GC Probe Amp, Genital: NEGATIVE

## 2010-11-27 NOTE — Progress Notes (Signed)
Summary: breast surgery / referral > OV scheduled  Phone Note Call from Patient   Caller: Patient Call For: nadel Summary of Call: pt says she was told to ask dr Kriste Basque for a referral to the breast center. pt says she has tissue  that needs to be removed in one of her breasts. pt # [id number] Initial call taken by: Tivis Ringer, CNA,  November 16, 2010 4:05 PM  Follow-up for Phone Call        [facility] Vernie Murders  November 16, 2010 4:45 PM  Pt c/o right breast tenderness since having tumor removed February 16, 2010. Pt states she called the [facility] to have routine mammogram done and was told by them that she will have to call our office first. Pt states skin is intact but is unable to tolerated anything touching it, not even water. Please advise. Thanks. Zackery Barefoot CMA  November 19, 2010 4:30 PM     Additional Follow-up for Phone Call Additional follow up Details #1::        Per SN, pt will need OV.    Called, spoke with pt.  Requesting OV on a Friday.  OV scheduled with TP for 11/30/10 at 12pm -- pt aware. Gweneth Dimitri RN  November 19, 2010 5:40 PM

## 2010-11-30 ENCOUNTER — Other Ambulatory Visit: Payer: 59

## 2010-11-30 ENCOUNTER — Other Ambulatory Visit: Payer: Self-pay | Admitting: Adult Health

## 2010-11-30 ENCOUNTER — Encounter: Payer: Self-pay | Admitting: Adult Health

## 2010-11-30 ENCOUNTER — Ambulatory Visit (INDEPENDENT_AMBULATORY_CARE_PROVIDER_SITE_OTHER): Payer: 59 | Admitting: Adult Health

## 2010-11-30 DIAGNOSIS — N644 Mastodynia: Secondary | ICD-10-CM

## 2010-11-30 DIAGNOSIS — R3129 Other microscopic hematuria: Secondary | ICD-10-CM | POA: Insufficient documentation

## 2010-11-30 DIAGNOSIS — N3000 Acute cystitis without hematuria: Secondary | ICD-10-CM

## 2010-11-30 LAB — URINALYSIS, ROUTINE W REFLEX MICROSCOPIC
Bilirubin Urine: NEGATIVE
Hgb urine dipstick: NEGATIVE
Total Protein, Urine: NEGATIVE
Urine Glucose: NEGATIVE
Urobilinogen, UA: 0.2 (ref 0.0–1.0)

## 2010-12-03 ENCOUNTER — Other Ambulatory Visit: Payer: Self-pay | Admitting: Pulmonary Disease

## 2010-12-03 DIAGNOSIS — Z1231 Encounter for screening mammogram for malignant neoplasm of breast: Secondary | ICD-10-CM

## 2010-12-03 LAB — CBC
HCT: 36.8 % (ref 36.0–46.0)
Hemoglobin: 12.4 g/dL (ref 12.0–15.0)
MCV: 88.1 fL (ref 78.0–100.0)
RBC: 4.18 MIL/uL (ref 3.87–5.11)
WBC: 9 10*3/uL (ref 4.0–10.5)

## 2010-12-03 LAB — COMPREHENSIVE METABOLIC PANEL
Albumin: 3.6 g/dL (ref 3.5–5.2)
Alkaline Phosphatase: 61 U/L (ref 39–117)
BUN: 13 mg/dL (ref 6–23)
CO2: 27 mEq/L (ref 19–32)
Chloride: 103 mEq/L (ref 96–112)
GFR calc non Af Amer: >60 mL/min (ref >60)
Glucose, Bld: 89 mg/dL (ref 70–99)
Potassium: 4.1 mEq/L (ref 3.5–5.1)
Total Bilirubin: 0.5 mg/dL (ref 0.3–1.2)

## 2010-12-03 LAB — PROTIME-INR: Prothrombin Time: 12.9 seconds (ref 11.6–15.2)

## 2010-12-03 LAB — DIFFERENTIAL
Basophils Absolute: 0.1 10*3/uL (ref 0.0–0.1)
Basophils Relative: 1 % (ref 0–1)
Monocytes Absolute: 0.6 10*3/uL (ref 0.1–1.0)
Neutro Abs: 6 10*3/uL (ref 1.7–7.7)
Neutrophils Relative %: 66 % (ref 43–77)

## 2010-12-04 NOTE — Assessment & Plan Note (Signed)
Summary: breast tenderness / cj   CC:  right breast tenderness/occ sharp pain around areola since tumer removal 02/2010: tender to touch, unable to bear water touching.  denies redness, and or warmth to touch.  History of Present Illness: 45 y/o BF with known hx of Hyperlipidemia   November 30, 2010 --Presents for work in visit. Complains of  right breast tenderness/occ sharp pain around areola since tumor removal 02/2010: tender to touch, unable to bear water touching.   Had benign tumor removed. Along scar it is very sensitive. No redness or draingae. NO masses noted. Due for mammogram now. Denies chest pain, dyspnea, orthopnea, hemoptysis, fever, n/v/d, edema, headache.  Medications Prior to Update: 1)  Pravastatin Sodium 40 Mg Tabs (Pravastatin Sodium) .... Take 1 Tab By Mouth At Bedtime.Marland KitchenMarland Kitchen 2)  Alprazolam 0.5 Mg  Tabs (Alprazolam) .[name].. 1 Tab By Mouth Three Times A Day As Needed For Nerves... 3)  Zithromax Z-Pak 250 Mg Tabs (Azithromycin) .... Take As Directed  Allergies (verified): 1)  ! Penicillin  Past History:  Past Medical History: Last updated: 01/12/2010 CIGARETTE SMOKER (ICD-305.1) HYPERTENSION, MILD (ICD-401.1) HYPERCHOLESTEROLEMIA (ICD-272.0) OBESITY (ICD-278.00) BREAST MASS, RIGHT (ICD-611.72) DEGENERATIVE JOINT DISEASE (ICD-715.90) BACK PAIN WITH RADICULOPATHY (ICD-729.2) Hx of HEADACHE (ICD-784.0) ANXIETY (ICD-300.00)  Past Surgical History: Last updated: 01/12/2010 S/P BTL S/P drainage of right groin abscess 5/03 by DrWeatherly S/P excision of mass from right index finger 4/06 by DrMyerdirks S/P L2-3 microdiscectomy 4/09 by Susann Givens  Family History: Last updated: 01/12/2010 mother alive and well father alive and hx of DM 4 siblings all alive and well  Social History: Last updated: 11/30/2010 current smoker  < 0.25 PPD exercise at least 2 times per week no caffeine use married 4 children Employ: [employer] declines flu shot 3.16.12  Risk  Factors: Smoking Status: current (08/03/2007)  Social History: current smoker  < 0.25 PPD exercise at least 2 times per week no caffeine use married 4 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  denies redness, or warmth to touch Is Patient Diabetic? No Comments Medications reviewed with patient Daytime contact number verified with patient. Boone Master CNA/MA  November 30, 2010 12:37 PM    Physical Exam  Additional Exam:  WD, Obese, 44 y/o BF in NAD... GENERAL:  Alert & oriented; pleasant & cooperative... HEENT:  [place]/AT, EOM-wnl, PERRLA, EACs-clear, TMs-wnl, NOSE-clear, THROAT-clear & wnl. NECK:  Supple w/ fairROM; no JVD; normal carotid impulses w/o bruits; no thyromegaly or nodules palpated; no lymphadenopathy. CHEST:  Clear to P & A; without wheezes/ rales/ or rhonchi. HEART:  Regular Rhythm; without murmurs/ rubs/ or gallops. BREAST : large pendulous breast, left with small healed surgical scar along bottom of breast, with no rednesss, massess/lesions noted. tender to touch, right normal  no  adenopathy noted.  ABDOMEN:  Obese, soft & nontender; normal bowel sounds; no organomegaly or masses detected. EXT: without deformities, mild arthritic changes; no varicose veins/ +venous insuffic/ no edema. N    Impression & Recommendations:  Problem # 1:  BREAST PAIN, RIGHT (ICD-611.71)  s/p benign mass removal in 2011, with residual pain along surgical  scar.  suspect area is hypersensitive , exam unremarkable.  plan  Warm heat to area as needed  Decrease caffeine.  Tylenol /advil as needed  OTC capsacin cream to area as needed  Please contact office for sooner follow up if symptoms do not improve or worsen  follow up for mammogram as planned.  follow up Dr. Kriste Basque in 3 months and as needed  follow up for mamogramm.   Orders: Est. Patient Level IV (30865)  Problem # 2:  MICROSCOPIC HEMATURIA (ICD-599.72) udip in er was positive for microscopic hematuria  repeat ua today.   Other Orders: T-Urine Culture (Spectrum Order) [id number]) TLB-Udip w/ Micro (81001-URINE)  Patient Instructions: 1)  I will call with lab results 2)  Warm heat to area as needed  3)  Decrease caffeine.  4)  Tylenol /advil as needed  5)  OTC capsacin cream to area as needed  6)  Please contact office for sooner follow up if symptoms do not improve or worsen  7)  follow up for mammogram as planned.  8)  follow up Dr. Kriste Basque in 3 months and as needed     Appended Document: breast tenderness / cj

## 2010-12-24 ENCOUNTER — Ambulatory Visit
Admission: RE | Admit: 2010-12-24 | Discharge: 2010-12-24 | Disposition: A | Discharge status: Home / Self Care | Payer: 59 | Source: Ambulatory Visit | Attending: Pulmonary Disease | Admitting: Pulmonary Disease

## 2010-12-24 DIAGNOSIS — Z1231 Encounter for screening mammogram for malignant neoplasm of breast: Secondary | ICD-10-CM

## 2011-01-09 ENCOUNTER — Encounter: Payer: Self-pay | Admitting: Pulmonary Disease

## 2011-01-11 ENCOUNTER — Encounter: Payer: Self-pay | Admitting: Pulmonary Disease

## 2011-01-11 ENCOUNTER — Other Ambulatory Visit (INDEPENDENT_AMBULATORY_CARE_PROVIDER_SITE_OTHER): Payer: 59 | Admitting: Pulmonary Disease

## 2011-01-11 ENCOUNTER — Ambulatory Visit (INDEPENDENT_AMBULATORY_CARE_PROVIDER_SITE_OTHER)
Admission: RE | Admit: 2011-01-11 | Discharge: 2011-01-11 | Disposition: A | Discharge status: Home / Self Care | Payer: 59 | Source: Ambulatory Visit | Attending: Pulmonary Disease | Admitting: Pulmonary Disease

## 2011-01-11 ENCOUNTER — Ambulatory Visit (INDEPENDENT_AMBULATORY_CARE_PROVIDER_SITE_OTHER): Payer: 59 | Admitting: Pulmonary Disease

## 2011-01-11 ENCOUNTER — Other Ambulatory Visit (INDEPENDENT_AMBULATORY_CARE_PROVIDER_SITE_OTHER): Payer: 59

## 2011-01-11 VITALS — BP 128/82 | HR 84 | Temp 98.2°F | Ht 62.0 in | Wt 232.4 lb

## 2011-01-11 DIAGNOSIS — E7800 Pure hypercholesterolemia, unspecified: Secondary | ICD-10-CM

## 2011-01-11 DIAGNOSIS — F17200 Nicotine dependence, unspecified, uncomplicated: Secondary | ICD-10-CM

## 2011-01-11 DIAGNOSIS — E669 Obesity, unspecified: Secondary | ICD-10-CM

## 2011-01-11 DIAGNOSIS — N63 Unspecified lump in unspecified breast: Secondary | ICD-10-CM

## 2011-01-11 DIAGNOSIS — F411 Generalized anxiety disorder: Secondary | ICD-10-CM

## 2011-01-11 DIAGNOSIS — Z0000 Encounter for general adult medical examination without abnormal findings: Secondary | ICD-10-CM

## 2011-01-11 DIAGNOSIS — M25569 Pain in unspecified knee: Secondary | ICD-10-CM

## 2011-01-11 DIAGNOSIS — M1990 Unspecified osteoarthritis, unspecified site: Secondary | ICD-10-CM

## 2011-01-11 DIAGNOSIS — I10 Essential (primary) hypertension: Secondary | ICD-10-CM

## 2011-01-11 LAB — CBC WITH DIFFERENTIAL/PLATELET
Basophils Absolute: 0 10*3/uL (ref 0.0–0.1)
Eosinophils Absolute: 0.2 10*3/uL (ref 0.0–0.7)
Lymphocytes Relative: 27.4 % (ref 12.0–46.0)
MCHC: 33.3 g/dL (ref 30.0–36.0)
Neutrophils Relative %: 60.1 % (ref 43.0–77.0)
RDW: 14 % (ref 11.5–14.6)

## 2011-01-11 LAB — BASIC METABOLIC PANEL
CO2: 26 mEq/L (ref 19–32)
Chloride: 108 mEq/L (ref 96–112)
Creatinine, Ser: 0.8 mg/dL (ref 0.4–1.2)

## 2011-01-11 LAB — URINALYSIS, ROUTINE W REFLEX MICROSCOPIC
Hgb urine dipstick: NEGATIVE
Nitrite: NEGATIVE
Urobilinogen, UA: 0.2 (ref 0.0–1.0)

## 2011-01-11 LAB — HEPATIC FUNCTION PANEL
ALT: 16 U/L (ref 0–35)
Alkaline Phosphatase: 64 U/L (ref 39–117)
Bilirubin, Direct: 0.1 mg/dL (ref 0.0–0.3)
Total Protein: 6.9 g/dL (ref 6.0–8.3)

## 2011-01-11 LAB — LIPID PANEL
Cholesterol: 233 mg/dL — ABNORMAL HIGH (ref 0–200)
Total CHOL/HDL Ratio: 3

## 2011-01-11 MED ORDER — ALPRAZOLAM 0.5 MG PO TABS: ORAL_TABLET | ORAL | Status: DC

## 2011-01-11 MED ORDER — PRAVASTATIN SODIUM 40 MG PO TABS: 40.0000 mg | ORAL_TABLET | Freq: Every day | ORAL | Status: DC

## 2011-01-11 NOTE — Progress Notes (Signed)
Subjective:    Patient ID: Dana [name], female    DOB: [birth year], 45 y.o.   MRN: [id number]  HPI 45 y/o BF here for a follow up visit...  ~  January 12, 2010:  Yearly ROV & CPX> recent stress w/ f/u mammogram showing enlarging lesion right breast... had sm nodule (~89mm) at 6 o'clock position right breast 4/10 w/ core biopsy = fibroadenoma... f/u mammogram 4/11 w/ 1.6 x 1cm lesion w/ lobulation... she is referred to [facility] & awaiting consult for excision 5/2 DrRosenbower... she has lost 20# this yr on diet & walking... BP still borderline controlled on diet alone (she refused meds) w/ BP 130/90 today... she was started on Prav40 for her Chol last yr but she stopped it on her own 2 mo ago... still smoking several cigs per day & not motivated to quit.  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         Problem List:  CIGARETTE SMOKER (ICD-305.1) - still smokes several cigarettes per day (~1/4ppd she says)... we discussed smoking cessation strategies and offered counselling, patches, Chantix, etc... she declines smoking cessation help & does not appear motivated to quit... She denies cough, sputum, hemoptysis, SOB, etc... ~  CXR 4/12 showed clear lungs, WNL...  HYPERTENSION, MILD (ICD-401.1) - borderline HBP- ?related to her weight... we prev prescribed LisinoprilL/Hct  Aug08 but she didn't follow up and stopped this on her  own... she prefers to treat this without meds & we discussed diet + exercise needed...   ~  4/11:  BP=130/90> denies HA, fatigue, visual changes, CP, palipit, dizziness, syncope, dyspnea, edema, etc; pt advised to get a BP cuff and start monitoring BP at home, get on diet- 2 gm sodium, 1200 cal/d weight reducing diet, and incease exercise program... ~  4/12:  BP still ~128/82 despite 12# wt gain over the last yr... she declines medRx.  HYPERCHOLESTEROLEMIA (ICD-272.0) - she refused to fill Simva Rx in past & was rec to take Prav40 but she has refused all meds for Chol. ~  FLP 8/08 showed TChol 244, TG 56, HDL 66, LDL 168- Simvastatin recommended, but pt refused. ~  FLP 3/10 showed TChol 247, TG 52, HDL 71, LDL 166... rec> try Pravastatin40 + diet & exercise. ~  FLP 4/11 off Prav40 x12mo showed TChol 240, TG 50, HDL 76, LDL158... rec> get bck on Prav40 (she never did) ~  FLP 4/12 on diet alone showed TChol 233, TG 30, HDL 72, LDL 154... She is rec to take the statin, but she prefers diet alone...  OBESITY (ICD-278.00) - diet/ exercise discussed w/ pt (again)... she refuses to consider bariatric approach... in her 5\' 4"  frame her BMI has been around 40... ~  weight 2/07 = 228# ~  weight 8/08 = 237# ~  weight 3/09 = 243# ~  weight 3/10 = 240# ~  weight 4/11 = 220# ~  weight 4/12 = 232#  BREAST MASS, RIGHT (ICD-611.72) - benign fibroadenoma removed by DrRosenbower 6/11... ~  4/12: she had a f/u mammogram at [facility]= neg...  DEGENERATIVE JOINT DISEASE (ICD-715.90) BACK PAIN WITH RADICULOPATHY (ICD-729.2) - presented 3/09 w/ left sided back pain into left leg... eval by DrDean & DrNitka w/  L2-3 microdiscectomy 4/09 by Susann Givens...  Hx of HEADACHE (ICD-784.0)  ANXIETY (ICD-300.00) - on ALPRAZOLAM 0.5mg  Tid Prn...   Past Surgical History  Procedure Date  . Tubal ligation   . Lumbar disc surgery     Outpatient Encounter Prescriptions as of 01/11/2011  Medication Sig Dispense Refill  .  pravastatin (PRAVACHOL) 40 MG tablet Take 40 mg by mouth daily.        [name] ALPRAZolam (XANAX) 0.5 MG tablet 1/2 to 1 tablet three times daily as needed for nerves         Allergies  Allergen Reactions  . Penicillins     REACTION: itching and swelling    Review of Systems        The patient complains of anxiety.  The patient denies fever, chills, sweats, anorexia, fatigue, weakness, malaise, weight loss, sleep disorder, blurring, diplopia, eye irritation, eye discharge, vision loss, eye pain, photophobia, earache, ear discharge, tinnitus, decreased hearing, nasal congestion, nosebleeds, sore throat, hoarseness, chest pain, palpitations, syncope, dyspnea on exertion, orthopnea, PND, peripheral edema, cough, dyspnea at rest, excessive sputum, hemoptysis, wheezing, pleurisy, nausea, vomiting, diarrhea, constipation, change in bowel habits, abdominal pain, melena, hematochezia, jaundice, gas/bloating, indigestion/heartburn, dysphagia, odynophagia, dysuria, hematuria, urinary frequency, urinary hesitancy, nocturia, incontinence, back pain, joint pain, joint swelling, muscle cramps, muscle weakness, stiffness, arthritis, sciatica, restless legs, leg pain at night, leg pain with exertion, rash, itching, dryness, suspicious lesions, paralysis, paresthesias, seizures, tremors, vertigo, transient blindness, frequent falls, frequent headaches, difficulty walking, depression, memory loss, confusion, cold intolerance, heat intolerance, polydipsia, polyphagia, polyuria, unusual weight change, abnormal bruising, bleeding, enlarged lymph nodes, urticaria, allergic rash, hay fever, and recurrent infections.     Objective:   Physical Exam     WD, Obese, 44 y/o BF in NAD... GENERAL:  Alert & oriented; pleasant & cooperative... HEENT:  [place]/AT, EOM-wnl, PERRLA, EACs-clear, TMs-wnl, NOSE-clear, THROAT-clear & wnl. NECK:  Supple w/ fairROM; no JVD; normal carotid impulses w/o bruits; no thyromegaly or nodules palpated; no  lymphadenopathy. CHEST:  Clear to P & A; without wheezes/ rales/ or rhonchi. HEART:  Regular Rhythm; without murmurs/ rubs/ or gallops. ABDOMEN:  Obese, soft & nontender; normal bowel sounds; no organomegaly or masses detected. EXT: without deformities, mild arthritic changes; no varicose veins/ +venous insuffic/ no edema. NEURO:  CN's intact;  no focal neuro deficits... DERM:  No lesions noted; no rash etc...   Assessment & Plan:   CPX>  CXR & labs reviewed> main finding is abn FLP w/ incr TChol & LDL as above;  rec to take Statin Rx & her best option is Prav40 on the $4 list;  She declines all meds for Chol & prefers diet alone, so we reviewed diet & exercise needed to lose wt & improve the lipid profile, she understands the risks of CAD & stroke... Must quit smoking as well!  HBP>  Borderline, again she doesn't want meds, so improvement will be depend on diet wt reduction...  CHOL>  As above, refuses meds, we reviewed diet...  Obesity>  Ditto, everything is dependent on her losing weight...  DJD>  She will f/u w/  DrDean & Susann Givens, her Orthopedists...   Anxiety>  On Alprazolam prn.Marland KitchenMarland Kitchen

## 2011-01-11 NOTE — Patient Instructions (Signed)
Today we updated your meds in our new EPIC system...    We refilled your Alprazolam & Pravastatin prescriptions...  Today we did your follow upCXR & fasting blood work...    Please call the [facility] in a few days for your results...    Dial [id number] & when prompted enter your patient number followed by the # symbol...    Your patient number is:  [id number]#  We will arrange for an appt w/ DrNitka to check your left knee for a poss Baker's cyst...  Please quit the smoking> Carla Drape is watching everything you do & will want to emulate his grandma, so quit for his sake!!! Let's get on track w/ our diet & exercise program, the goal is to lose 15-20 lbs!!!  Call for any problems... Let's plan a recheck in 1 year.Marland KitchenMarland Kitchen

## 2011-01-20 ENCOUNTER — Encounter: Payer: Self-pay | Admitting: Pulmonary Disease

## 2011-01-29 NOTE — Op Note (Signed)
NAMEJASLYN, BANSAL               ACCOUNT NO.:  [id number]   MEDICAL RECORD NO.:  [id number]          PATIENT TYPE:  OIB   LOCATION:  2550                         FACILITY:  [facility]   PHYSICIAN:  Kerrin Champagne, M.D.   DATE OF BIRTH:  September 19, 1965   DATE OF PROCEDURE:  12/17/2007  DATE OF DISCHARGE:                               OPERATIVE REPORT   PREOPERATIVE DIAGNOSIS:  Herniated nucleus pulposus, L2-3, with free  fragment extending over the posterior superior aspect of L3 vertebral,  effecting primarily the left L3 nerve root.   POSTOPERATIVE DIAGNOSIS:  Herniated nucleus pulposus, L2-3, with free  fragment extending over the posterior superior aspect of L3 vertebral,  effecting primarily the left L3 nerve root.   PROCEDURE:  Left L2-3 microdiskectomy using minimally invasive set by  [employer], the AccuVision set.   SURGEON:  Kerrin Champagne, M.D.   ASSISTANT:  None.   ANESTHESIA:  General via orotracheal intubation, supplemented with local  infiltration with Marcaine 0.5% with 1:200,000 epinephrine 10 cc.   FINDINGS:  HNP, left L2-3, with free fragment extending over the  superior posterior aspect L3 vertebral body.   SPECIMENS:  None.   ESTIMATED BLOOD LOSS:  25 cc.   COMPLICATIONS:  None.   BRIEF CLINICAL HISTORY:  A 45 year old female with a three week history  of severe back pain, radiation into the left lower extremity, worsening.  Underwent attempt at epidural steroid, having recalcitrant pain and  radiation of the left L3 distribution.  MRI scan shows HNP at the L2-3  level, degenerative disk changes, free fragment extending over the  posterior aspect of the superior posterior body of L3 on the left side,  affecting primarily the L3 nerve root.  Intraoperative findings as  above.   DESCRIPTION OF PROCEDURE:  After adequate general anesthesia, the  patient was placed in a prone position.  A Wilson frame was used,  Hydrographic surveyor.  All pressure points were well  padded.  PAS stockings.  Standard preoperative antibiotics with vancomycin with PENICILLIN  allergy.  Standard prep with DuraPrep solutions.  Areas of fat over the  lateral aspect of the hips and thighs were retracted distally and  carefully.  A standard prep in the midline.  DuraPrep solution.  Draped  in the usual manner.  An iodine Viadrape was used.  Two initial spinal  needles were placed, expected L2 and L3 level.  Intraoperative C-arm  fluoro brought in the field and observed the needles to be at the right  of the midline.  The needles were then subsequently placed to the left  side of the midline, and at the expected L2 and L3 levels, observed in  both the AP plane and lateral planes to be in correct position and  alignment.  The lowest needle at the upper aspect of the L3 pedicle,  just below the L2-3 disk.  Incision was then made based on a lower  needle, approximately 1-1/2 inch in length, through the skin and  subcutaneous layers.  Venous bleeding present, prominent.  Cauterization  used to control bleeders.  Brought dissection down  to the lumbodorsal  fascia and then incision then made on the left side of the inner spinous  process ligaments and the spinous process of L2-3.  The smaller of the  dilators was introduced through this defect in the lumbodorsal fascia  down to the interlaminar region at the L2-3 level, used to perform  subperiosteal dissection about the medial aspect of the facet at L2-3.  Dilation then carried out.  C-arm fluoro used to ascertain the correct  position alignment for the dilators overlying the medial aspect of the  L2-3 facet.  This was below the expected L2-3 disk space by about 3-4  mm.  The AccuVision equipment retractors then carefully placed over the  dilators down to the level of the posterior elements on the left side,  after first measuring for correct depth.  A 70 mm depth was chosen.  These were then placed down and attached to the upright  frame and the  arms adjusted and stabilized in the frame for the retractors at the L2-3  level.  The retractor blades were then carefully canted outwards and  then spread in order to visualize the posterior aspect of the interspace  at the L2-3 level on the left side.   Kerrisons were then used to debride soft tissue attachments over the  left L2-3 level, debriding down to the interlaminar space and exposing  interlaminar space.  Electrocautery used to control bleeders using  extension.  The high speed bur was then used to remove and thin the  inferior aspect of the lamina on the left side of L2, the medial aspect  of the facet at L2-3 by about 15-20% over the inferior articular  process.   A 2 mm Kerrison used to enter over the superior aspect of the lamina of  L3 and a Penfield 4 was used to carefully tease the fibers of the  ligamentum flavum in order to allow for entry into the spinal canal and  excision of the ligamentum flavum on the left side of the superior  aspect of the L3 lamina and off the medial aspect of the facet at the L2-  3 level continued upwards.  When this was completed, then a Penfield 4  was then placed into the spinal canal along the lateral aspect of the  thecal sac on the left side and directed towards the disk space at L2-3.  Intraoperative C-arm fluoro demonstrated the Penfield 4 directed at the  posterior aspect of the disk at the L2-3 level, identifying this as the  correct level.   With this, using the loupe magnification head lamp, then superior  portion of the L3 lamina was resected in such a way as to preserve pars  over 8 mm lateral of width, and 3 mm Kerrisons were used to perform  this.  The decompression was then carried out with foraminotomy over the  left L3 nerve root and the left side of the spinal canal over the  superior aspect of the vertebral body of L3.  The medial border of the  L3 pedicle was identified.   An operating room microscope  was then draped and brought into the field  following removal with C-arm fluoro.  Under the operating room  microscope, then careful hemostasis was maintained.  A Penfield 4 and  D'Errico was then used to carefully manipulate the thecal sac to  identify disk herniation material over the lateral aspect of the thecal  sac just at the superior margin of the  pedicle at L3.  A nerve hook was  then used to carefully tease disk material out from underneath the L3  nerve root in the lateral aspect of the thecal sac, and micropituitary  was then used to coax disk material laterally and to excise the disk  material laterally.  Following this, then the L3 nerve root and thecal  sac was markedly freed up and able to be retracted easier.  A Penfield  was then able to be used to retract the L3 nerve root, lateral aspect of  the thecal sac.  A curved D'Errico was then able to be introduced.  Further herniated loose fragments of disk material were then able to be  excised from the spinal canal.  A large fragment that was excised  initially measured about 2 cm x 1 cm x approximately 4-5 cm.  This  represented the bulk of the large herniated free fragment.  Searching  the canal beneath the L3 nerve root, at its neural foramen, using blunt  tip on probe, was unsuccessful in finding any further disk material  extending down the posterior aspect of the vertebral body of L3,  continuing upwards several fragments of cartilage material found over  the posterior of the upper body of L3.  These were excised using  micropituitaries and regular pituitaries.  The disk space at the L2-3  level was extremely narrow posteriorly, as such that entry could not be  made, even with a Penfield 4.  Disk was noted to be protruded  posteriorly but primarily represented hard disk material and spur within  the posterior aspect of the disk space.  An incision was made into the  posterior longitudinal ligament.  Additional attempts  were made to  remove any debris here, but none were found to be present.  Irrigation  was performed.  Thrombin-soaked Gelfoam placed and then removed,  obtaining total hemostasis.  Bone wax was applied to the bleeding  cancellous bone surfaces.  Additional inspection of the spinal canal  demonstrated the L3 nerve exiting up the neural foramen without further  compression.  The lateral recess and lateral canal completely well  decompressed as was the L2 neural foramen.  Irrigation was then carried  out, and the minimally invasive [employer] AccuVision set was then removed.  Retractors were released and removed.  With this, the lumbodorsal fascia  was reapproximated with interrupted 0 Vicryl sutures and an UR6 needle.  Deep subcu layers were reapproximated with interrupted UR6 0 Vicryl  sutures.  The subcu layers reapproximated with interrupted 0, then  interrupted 2-0 Vicryl.  The skin was closed with a running subcu stitch  of 4-0 Vicryl.  The patient then had Dermabond applied and a Coverlet  dressing.  Please note that the subcu layers were infiltrated with  Marcaine 0.5% with 1:200,000 epinephrine at the end of closure.  Patient  was then reactivated, extubated, and returned to the recovery room in  satisfactory condition.  All instrument and sponge counts were correct.      Kerrin Champagne, M.D.  Electronically Signed     JEN/MEDQ  D:  12/17/2007  T:  12/17/2007  Job:  [id number]

## 2011-01-29 NOTE — Op Note (Signed)
[name], [name]               ACCOUNT NO.:  [id number]   MEDICAL RECORD NO.:  [id number]          PATIENT TYPE:  AMB   LOCATION:  DSC                          FACILITY:  [facility]   PHYSICIAN:  Kerrin Champagne, M.D.   DATE OF BIRTH:  02/04/1966   DATE OF PROCEDURE:  DATE OF DISCHARGE:                               OPERATIVE REPORT   PREOPERATIVE DIAGNOSIS:  Suture abscesses x2 of lumbar surgical incision  site microdiscectomy L2-3.   POSTOPERATIVE DIAGNOSIS:  Suture abscesses x2 of lumbar surgical  incision site microdiscectomy L2-3.   PROCEDURE:  Exploration of superficial incision wound L2-3 with removal  of retained Vicryl suture material.   SURGEON:  Kerrin Champagne, M.D.   ASSISTANT:  None.   ANESTHESIA:  General via orotracheal intubation, Dr. Autumn Patty.   FINDINGS:  Two small 3-4 mm in length wounds x 4-5 mm depth with  retained subcuticular suture material, Vicryl.   ESTIMATED BLOOD LOSS:  Less than 5 mL.   COMPLICATIONS:  None.   The patient returned to the ACU in good condition.   HISTORY OF PRESENT ILLNESS:  The patient is a 45 year old female who was  treated for a lumbar disk herniation in the L2-3 level with severe lower  extremity pain and discomfort.  She underwent intervention  microdiscectomy at L2-3 performed nearly 5 weeks ago on December 17, 2007.  Surgery was uneventful.  The patient was discharged home the following  day.  She has done well postoperatively, was seen back in the office  this week, almost 4 weeks following her surgery with some small area of  drainage over the superior aspect of her incision site.  This was  evaluated, found to represent a suture abscess.  She was quite tender  and painful and unable to remove the retained suture at that time.  She  returned the following Wednesday, 2 days later yesterday, complaining of  severe increasing discomfort and pain associated with the incision site,  unable to return to work the  previous day due to muscle spasm.  The  evaluation of the incision site demonstrated again the area of suture  abscess in the superior extent of her incision site and also a smaller  area opening over the inferior one-third of the incision site, also a 2-  3 mm open wound.  A small amount of retained suture was able to be  identified, but unable to be removed as the patient despite severe pain  behavior and was quite uncomfortable with the attempts at removing this  retained suture.  She indicated that she did not feel as though she  could tolerate this type of discomfort with removal of small retained  suture material so that she was scheduled to have outpatient exploration  of the superficial wound with removal of any retained suture under a  general anesthetic.   NATURE OF OPERATIVE FINDINGS:  As above.   DESCRIPTION OF PROCEDURE:  After adequate general anesthesia and  intubation, the patient was then placed in aprone position.  Chest rolls  were used.  All pressure points well padded.  The arms at the side well  padded.  She had standard prep with DuraPrep solution over the lumbar  area extending from the mid-dorsal regions to the sacral level.  She was  draped with regular operating room sterile drapes of towels.  The  openings over the superior extent of the incision site then carefully  explored using Adson pickups, and suture material was identified.  This  was then carefully retracted from the incision and a small iris-type  sharp scissors was then used to carefully remove the suture retained  here.  The suture appeared to represent a subcutaneous stitch material  of 4-0 Vicryl.  The small opening over the inferior one-third of the  incision site was then similarly explored using the Adson's forceps and  a small retained suture extending from this site caudally was identified  and this was placed under retraction and removed also allowing for  removal of any retained suture  here.  Additional exploration of both of  these small open wound sites demonstrated they were quite superficial.  There was no extension to deeper layers and no significant purulence  able to be expressed from the wound or deep areas.  Suggesting that  these represented suture abscesses were quite superficial.  Each of  these areas were then packed with normal saline-soaked portions of 4 x 4  that had been cut in strips of about 3 mm width at each site packing  with about 3 or 4 mm of length of this material.  The 4 x 4 was then  applied to the skin and then ABD pad fixed to the skin with Hypafix  tape.  The patient then returned to supine position, reacted, and then  extubated, and returned to the recovery room in satisfactory condition.  All instruments and sponge counts were correct.   POST OPERATIVE CARE:  The patient will keep her present dressing.  She  can ambulate and weight bear as tolerated.  Hydrocodone 1 tablet every 4-  6 hours p.r.n. discomfort.  To be seen back in the office in the morning  for removal of the dressing and removal of the packing.  We expect she  will go on to heal these 2 small areas by secondary intention.  I will  keep her out of work until these have healed and also she may continue  with physical therapy even with the small wounds.      Kerrin Champagne, M.D.  Electronically Signed     JEN/MEDQ  D:  01/21/2008  T:  01/22/2008  Job:  [id number]

## 2011-02-01 NOTE — Op Note (Signed)
[facility]  Patient:    Dana [name], Dana [name] Visit Number: [id number] MRN: [id number]          Service Type: SUR Location: 4W [id number] [id number] Attending Physician:  Henrene Dodge Dictated by:   Anselm Pancoast. Zachery Dakins, M.D. Proc. Date: 02/08/02 Admit Date:  02/08/2002 Discharge Date: 02/09/2002                             Operative Report  PREOPERATIVE DIAGNOSIS:  Abscess right femoral area.  OPERATION:  Drainage of abscess, folliculitis right groin femoral area.  ANESTHESIA:  General.  SURGEON:  Anselm Pancoast. Zachery Dakins, M.D.  HISTORY:  Dana [name] is a 45 year old moderately overweight female who presented to the emergency room with approximately a week of progressive tenderness in the right femoral groin area. The patient appears to shave her pubic area and has an area of cellulitis and marked tenderness with induration of deep underlying thickening and then has a few little superficial abscesses slightly more lateral. She has not been on antibiotics and on examination, I could not be sure there was obviously an abscess but there was certainly marked inflammation. White count is about 16,000 and I recommended that we explore this and she said because of the pain, she could not do it unless we do general anesthesia. I am in agreement with this since the area is not well localized but there is an obvious fullness down. This about two fingers below the groin crease. On examination, I do not find any abscesses around the vagina or perineal body area and on straining, this does not appear to enlarge as if it could possibly be a femoral hernia. The patient was given a gram of Kefzol and taken to the operative suite.  DESCRIPTION OF PROCEDURE:  After induction of anesthesia via endotracheal tube, I first examined the patient again with the anesthesia and cold not feel any additional thickenings. She was then draped after prepping with Betadine solution  and then an incision about an inch and a half in length, right over this area of folliculitis, was made about an inch down into the adipose tissue. There was a frank abscess. There was folliculitis and inflammation in the more superficial subcutaneous tissue. I elected to kind of ellipse out a little area of skin that contained those three little more superficial abscesses. On exploration of the wound with her asleep, I did not feel any thickening. I did not actually go down to the femoral lymph node areas as there was no thickening or further deep inflammation. There were two little abscesses more laterally that I basically ellipsed out the area and all these areas were kind of loosely closed with 3-0 nylon. I did pack the larger cavity with Betadine-soaked gauze 4 x 4 that was brought out through the incision. The patient tolerated the procedure and was taken to the recovery room in stable postoperative condition. We will keep her overnight for IV antibiotics and then release her in the morning with more antibiotics. She will not be able to return to work this week and this hopefully will heal. I think the patient is nicking herself while shaving as the source is abscesses.  The specimen was sent for pathology exam and a culture was sent also. Dictated by:   Anselm Pancoast. Zachery Dakins, M.D. Attending Physician:  Henrene Dodge DD:  02/08/02 TD:  02/09/02 Job: [id number]

## 2011-02-01 NOTE — Op Note (Signed)
[name], Dana [name]               ACCOUNT NO.:  [id number]   MEDICAL RECORD NO.:  [id number]          PATIENT TYPE:  AMB   LOCATION:  DSC                          FACILITY:  [facility]   PHYSICIAN:  Tennis Must Meyerdierks, M.D.DATE OF BIRTH:  1966/06/05   DATE OF PROCEDURE:  01/04/2005  DATE OF DISCHARGE:                                 OPERATIVE REPORT   PREOPERATIVE DIAGNOSIS:  Mass, right index finger, possible foreign  body  reaction.   POSTOPERATIVE DIAGNOSIS:  Mass, right index finger, possible foreign body  reaction.   PROCEDURE:  Excision of mass right index finger.   SURGEON:  Lowell Bouton, M.D.   ANESTHESIA:  0.5% Marcaine local with sedation.   OPERATIVE FINDINGS:  The patient had purulent material beneath the skin with  a granular type of mass over the volar aspect of the PIP joint.  A specific  foreign body could not be identified, however.   PROCEDURE:  Under 0.5% Marcaine local anesthesia with a tourniquet on the  right arm, the right hand was prepped and draped in the usual fashion.  After exsanguinating the limb, the tourniquet was inflated to 250 mmHg.  A V-  shaped incision was made across the PIP joint volarly.  Blunt dissection was  carried through the subcutaneous tissues and gross purulent material was  obtained.  It was cultured and a small black area volarly in the skin was  unroofed with the knife.  Purulent material came out of there but there was  no specific foreign body identified.  Blunt dissection was carried down to  the flexor sheath and the entire granulomatous mass was excised and sent to  the lab.  Care was taken to protect the digital nerves and the tendons.  The  wound was copiously irrigated with saline and the skin was closed with 4-0  nylon sutures.  Sterile dressings were applied.  The tourniquet was released  with good circulation of the hand.  The patient went to the recovery room  awake, stable, in good  condition.      EMM/MEDQ  D:  01/04/2005  T:  01/04/2005  Job:  [id number]

## 2011-02-25 ENCOUNTER — Encounter: Payer: Self-pay | Admitting: Pulmonary Disease

## 2011-03-01 ENCOUNTER — Ambulatory Visit: Payer: 59 | Admitting: Pulmonary Disease

## 2011-04-05 ENCOUNTER — Other Ambulatory Visit (HOSPITAL_COMMUNITY)
Admission: RE | Admit: 2011-04-05 | Discharge: 2011-04-05 | Disposition: A | Discharge status: Home / Self Care | Payer: 59 | Source: Ambulatory Visit | Attending: Gynecology | Admitting: Gynecology

## 2011-04-05 ENCOUNTER — Other Ambulatory Visit: Payer: Self-pay | Admitting: Gynecology

## 2011-04-05 ENCOUNTER — Ambulatory Visit (INDEPENDENT_AMBULATORY_CARE_PROVIDER_SITE_OTHER): Payer: 59 | Admitting: Gynecology

## 2011-04-05 DIAGNOSIS — B373 Candidiasis of vulva and vagina: Secondary | ICD-10-CM

## 2011-04-05 DIAGNOSIS — Z113 Encounter for screening for infections with a predominantly sexual mode of transmission: Secondary | ICD-10-CM

## 2011-04-05 DIAGNOSIS — Z124 Encounter for screening for malignant neoplasm of cervix: Secondary | ICD-10-CM | POA: Insufficient documentation

## 2011-04-05 DIAGNOSIS — IMO0002 Reserved for concepts with insufficient information to code with codable children: Secondary | ICD-10-CM

## 2011-04-05 DIAGNOSIS — N920 Excessive and frequent menstruation with regular cycle: Secondary | ICD-10-CM

## 2011-04-05 DIAGNOSIS — N946 Dysmenorrhea, unspecified: Secondary | ICD-10-CM

## 2011-04-09 ENCOUNTER — Ambulatory Visit: Payer: 59 | Admitting: Pulmonary Disease

## 2011-05-01 ENCOUNTER — Telehealth: Payer: Self-pay | Admitting: Pulmonary Disease

## 2011-05-01 MED ORDER — MOXIFLOXACIN HCL 400 MG PO TABS: 400.0000 mg | ORAL_TABLET | Freq: Every day | ORAL | Status: AC

## 2011-05-01 NOTE — Telephone Encounter (Signed)
Pt c/o sore throat, itchy runny eyes, productive cough with yellow to brown mucus, body aches, chills, sweats, chest and nasal congestion, traces of blood when blowing nose. Pt denies fever, wheezing, SOB, diarrhea, vomiting. Has tried OTC Alka-seltzer Cold Plus, and Mucinex Chest congestion w/o relief. Please advise. Thank you.  Allergies  Allergen Reactions  . Penicillins     REACTION: itching and swelling

## 2011-05-01 NOTE — Telephone Encounter (Signed)
[id number] 

## 2011-05-01 NOTE — Telephone Encounter (Signed)
Called and spoke with pt and she is aware per SN to take the avelox 400mg   #7  1 daily until gone.  This has been sent to the pharmacy and pt is aware

## 2011-05-15 ENCOUNTER — Telehealth: Payer: Self-pay | Admitting: *Deleted

## 2011-05-15 NOTE — Telephone Encounter (Signed)
I CALLED PT AND GAVE HER URODYNAMIC INSURANCE BENFITS AND ASKED IF SHE WANTED TO PROCEED WITH TESTING AND SHE SAID YES. SHE SAID SHE WOULD CALL BACK TO SCHEDULE BC SHE WAS GETTING READY TO GO OUT OF TOWN.  I GAVE HER MY NAME AND NUMBER.

## 2011-06-10 LAB — BASIC METABOLIC PANEL
Chloride: 107
Creatinine, Ser: 0.85
GFR calc Af Amer: >60
Potassium: 4
Sodium: 138

## 2011-06-10 LAB — URINALYSIS, ROUTINE W REFLEX MICROSCOPIC
Glucose, UA: NEGATIVE
Protein, ur: NEGATIVE
Specific Gravity, Urine: 1.031 — ABNORMAL HIGH

## 2011-06-10 LAB — CBC
MCV: 84.9
RBC: 4.57
WBC: 7.8

## 2011-06-10 LAB — DIFFERENTIAL
Eosinophils Absolute: 0.2
Lymphs Abs: 1.7
Monocytes Relative: 8
Neutrophils Relative %: 68

## 2011-06-10 LAB — URINE MICROSCOPIC-ADD ON

## 2011-06-11 LAB — CBC
HCT: 41.7
Hemoglobin: 13.8
MCHC: 33
MCV: 86.1
RDW: 13.5

## 2011-06-11 LAB — BASIC METABOLIC PANEL
CO2: 28
Calcium: 9.2
Glucose, Bld: 85
Potassium: 4.6
Sodium: 137

## 2011-06-26 LAB — URINALYSIS, ROUTINE W REFLEX MICROSCOPIC
Bilirubin Urine: NEGATIVE
Glucose, UA: NEGATIVE
Hgb urine dipstick: NEGATIVE
Ketones, ur: NEGATIVE
Protein, ur: NEGATIVE

## 2011-07-22 ENCOUNTER — Other Ambulatory Visit: Payer: Self-pay | Admitting: *Deleted

## 2011-07-22 MED ORDER — ALPRAZOLAM 0.5 MG PO TABS: ORAL_TABLET | ORAL | Status: DC

## 2011-09-02 ENCOUNTER — Telehealth: Payer: Self-pay | Admitting: Pulmonary Disease

## 2011-09-02 NOTE — Telephone Encounter (Signed)
lmomtcb--pt needs OV for refills

## 2011-09-03 ENCOUNTER — Other Ambulatory Visit: Payer: Self-pay | Admitting: *Deleted

## 2011-09-03 MED ORDER — ALPRAZOLAM 0.5 MG PO TABS: ORAL_TABLET | ORAL | Status: DC

## 2011-09-03 NOTE — Telephone Encounter (Signed)
Made appointment w/ TP for 12/24 as per Lori's guidance.  Nothing further needed per pt.  Dana [name]

## 2011-09-03 NOTE — Telephone Encounter (Signed)
LMOMTCB x 1 

## 2011-09-09 ENCOUNTER — Ambulatory Visit: Payer: 59 | Admitting: Adult Health

## 2011-09-18 ENCOUNTER — Encounter: Payer: Self-pay | Admitting: Adult Health

## 2011-09-18 ENCOUNTER — Ambulatory Visit (INDEPENDENT_AMBULATORY_CARE_PROVIDER_SITE_OTHER): Payer: 59 | Admitting: Adult Health

## 2011-09-18 DIAGNOSIS — E7800 Pure hypercholesterolemia, unspecified: Secondary | ICD-10-CM

## 2011-09-18 DIAGNOSIS — M1990 Unspecified osteoarthritis, unspecified site: Secondary | ICD-10-CM

## 2011-09-18 MED ORDER — ALPRAZOLAM 0.5 MG PO TABS: ORAL_TABLET | ORAL | Status: DC

## 2011-09-18 MED ORDER — HYDROCODONE-ACETAMINOPHEN 5-500 MG PO TABS: 1.0000 | ORAL_TABLET | Freq: Four times a day (QID) | ORAL | Status: DC | PRN

## 2011-09-18 NOTE — Patient Instructions (Signed)
Advil 200mg  4 tabs with food Three times a day  For 5 days  Alternate ice and heat to low back  Vicodin for severe pain 1 tab every 6 hr as needed.  If not improving will need to see Ortho , Dr. Otelia Sergeant  follow up Dr. Kriste Basque  In 6 months for physical.  Work on weight loss, low cholestrol diet

## 2011-09-19 NOTE — Assessment & Plan Note (Signed)
Encouraged on diet and weight loss Cont on current regimen

## 2011-09-19 NOTE — Assessment & Plan Note (Signed)
Low back strain  Plan:  Advil 200mg  4 tabs with food Three times a day  For 5 days  Alternate ice and heat to low back  Vicodin for severe pain 1 tab every 6 hr as needed.  If not improving will need to see Ortho , Dr. Otelia Sergeant  follow up Dr. Kriste Basque  In 6 months for physical.

## 2011-09-19 NOTE — Progress Notes (Signed)
Subjective:    Patient ID: Dana [name], female    DOB: 28-Mar-1966, 46 y.o.   MRN: [id number]  HPI  46 y/o BF with known hx of Hyperlipidemia   ~  January 12, 2010:  Yearly ROV & CPX> recent stress w/ f/u mammogram showing enlarging lesion right breast... had sm nodule (~37mm) at 6 o'clock position right breast 4/10 w/ core biopsy = fibroadenoma... f/u mammogram 4/11 w/ 1.6 x 1cm lesion w/ lobulation... she is referred to [facility] & awaiting consult for excision 5/2 DrRosenbower... she has lost 20# this yr on diet & walking... BP still borderline controlled on diet alone (she refused meds) w/ BP 130/90 today... she was started on Prav40 for her Chol last yr but she stopped it on her own 2 mo ago... still smoking several cigs per day & not motivated to quit.  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  09/18/11 Follow up  Pt returns for month follow up. Does complain of left lower back pain x 2 weeks.  hx of back surgery in past by Dr. Otelia Sergeant-  L2-3 microdiscectomy 4/09 . Has been using Tylenol without much help.  Aching along left lower back radiates into upper buttock. No rash, known injury. Pain with bending, prolonged sitting gets stiff, pain with trying to get up from sitting or lying position. No urinary symptoms.   Overall except for back doing well. Working on weight loss without much success -wt trending up Discussed weight  loss. No chest pain or edema.            Problem List:  CIGARETTE SMOKER (ICD-305.1) - still smokes several cigarettes per day (~1/4ppd she says)... we discussed smoking cessation strategies and offered counselling, patches, Chantix, etc... she declines smoking cessation help & does not appear motivated to quit... She denies cough, sputum, hemoptysis, SOB, etc... ~  CXR 4/12 showed clear lungs, WNL...  HYPERTENSION, MILD (ICD-401.1) - borderline HBP- ?related to her weight... we prev prescribed LisinoprilL/Hct  Aug08 but she didn't follow up and stopped this on her own... she prefers to treat this without meds & we discussed diet + exercise needed...   ~  4/11:  BP=130/90> denies HA, fatigue, visual changes, CP, palipit, dizziness, syncope, dyspnea, edema, etc; pt advised to get a BP cuff and start monitoring BP at home, get on diet- 2 gm sodium, 1200 cal/d weight reducing diet, and incease exercise program... ~  4/12:  BP still ~128/82 despite 12# wt gain over the last yr... she declines medRx.  HYPERCHOLESTEROLEMIA (ICD-272.0) - she refused to fill Simva Rx in past & was rec to take Prav40 but she has refused all meds for Chol. ~  FLP 8/08 showed TChol 244, TG 56, HDL 66, LDL 168- Simvastatin recommended, but pt refused. ~  FLP 3/10 showed TChol 247, TG 52,  HDL 71, LDL 166... rec> try Pravastatin40 + diet & exercise. ~  FLP 4/11 off Prav40 x67mo showed TChol 240, TG 50, HDL 76, LDL158... rec> get bck on Prav40 (she never did) ~  FLP 4/12 on diet alone showed TChol 233, TG 30, HDL 72, LDL 154... She is rec to take the statin, but she prefers diet alone...  OBESITY (ICD-278.00) - diet/ exercise discussed w/ pt (again)... she refuses to consider bariatric approach... in her 5\' 4"  frame her BMI has been around 40... ~  weight 2/07 = 228# ~  weight 8/08 = 237# ~  weight 3/09 = 243# ~  weight 3/10 = 240# ~  weight 4/11 = 220# ~  weight 4/12 = 232#  BREAST MASS, RIGHT (ICD-611.72) - benign  fibroadenoma removed by DrRosenbower 6/11... ~  4/12: she had a f/u mammogram at [facility]= neg...  DEGENERATIVE JOINT DISEASE (ICD-715.90) BACK PAIN WITH RADICULOPATHY (ICD-729.2) - presented 3/09 w/ left sided back pain into left leg... eval by DrDean & DrNitka w/  L2-3 microdiscectomy 4/09 by Susann Givens...  Hx of HEADACHE (ICD-784.0)  ANXIETY (ICD-300.00) - on ALPRAZOLAM 0.5mg  Tid Prn...   Past Surgical History  Procedure Date  . Tubal ligation   . Lumbar disc surgery     Outpatient Encounter Prescriptions as of 09/18/2011  Medication Sig Dispense Refill  . ALPRAZolam (XANAX) 0.5 MG tablet 1/2 to 1 tablet three times daily as needed for nerves  90 tablet  5  . pravastatin (PRAVACHOL) 40 MG tablet Take 1 tablet (40 mg total) by mouth daily.  30 tablet  11  . DISCONTD: ALPRAZolam (XANAX) 0.5 MG tablet 1/2 to 1 tablet three times daily as needed for nerves  90 tablet  0  . HYDROcodone-acetaminophen (VICODIN) 5-500 MG per tablet Take 1 tablet by mouth every 6 (six) hours as needed for pain.  20 tablet  0    Allergies  Allergen Reactions  . Penicillins     REACTION: itching and swelling    Review of Systems Constitutional:   No  weight loss, night sweats,  Fevers, chills,  +fatigue, or  lassitude.  HEENT:   No headaches,  Difficulty swallowing,  Tooth/dental problems, or  Sore throat,                No sneezing, itching, ear ache, nasal congestion, post nasal drip,   CV:  No chest pain,  Orthopnea, PND, swelling in lower extremities, anasarca, dizziness, palpitations, syncope.   GI  No heartburn, indigestion, abdominal pain, nausea, vomiting, diarrhea, change in bowel habits, loss of appetite, bloody stools.   Resp: No shortness of breath with exertion or at rest.  No excess mucus, no productive cough,  No non-productive cough,  No coughing up of blood.  No change in color of mucus.  No wheezing.  No chest wall deformity  Skin: no rash or lesions.  GU: no dysuria, change in color of  urine, no urgency or frequency.  No flank pain, no hematuria   MS:  No joint swelling.  + decreased range of motion.  + back pain.  Psych:  No change in mood or affect. No depression or anxiety.  No memory loss.              Objective:   Physical Exam      WD, Obese, 45 y/o BF in NAD... GENERAL:  Alert & oriented; pleasant & cooperative... HEENT:  [place]/AT, EOM-wnl, PERRLA, EACs-clear, TMs-wnl, NOSE-clear, THROAT-clear & wnl. NECK:  Supple  w/ fair ROM; no JVD; normal carotid impulses w/o bruits; no thyromegaly or nodules palpated; no lymphadenopathy. CHEST:  Clear to P & A; without wheezes/ rales/ or rhonchi. HEART:  Regular Rhythm; without murmurs/ rubs/ or gallops. ABDOMEN:  Obese, soft & nontender; normal bowel sounds; no organomegaly or masses detected. EXT: without deformities, mild arthritic changes; no varicose veins/ +venous insuffic/ no edema. BACK ; tender along left lower back. No rash or redness/no deformity noted.  Neg SLR . nml gait. Equal strength bilaterally.  NEURO:  CN's intact;  no focal neuro deficits,  DERM:  No lesions noted; no rash etc...   Assessment & Plan:

## 2011-10-03 ENCOUNTER — Telehealth: Payer: Self-pay | Admitting: Pulmonary Disease

## 2011-10-03 NOTE — Telephone Encounter (Signed)
Offer Zpak 

## 2011-10-03 NOTE — Telephone Encounter (Signed)
Spoke with pt She is c/o runny nose and sore throat since this am, also voice has become hoarse She denies any cough, wheeze, sob, fever Would like recs from SN Will forward to doc of the day since SN has left for the day Please advise, thanks! Allergies  Allergen Reactions  . Penicillins     REACTION: itching and swelling

## 2011-10-04 NOTE — Telephone Encounter (Signed)
lmomtcb  

## 2011-10-07 MED ORDER — AZITHROMYCIN 250 MG PO TABS: ORAL_TABLET | ORAL | Status: AC

## 2011-10-07 NOTE — Telephone Encounter (Signed)
rx sent. Pt aware.Jennifer Castillo, CMA  

## 2011-10-22 ENCOUNTER — Telehealth: Payer: Self-pay | Admitting: Pulmonary Disease

## 2011-10-22 MED ORDER — FIRST-DUKES MOUTHWASH MT SUSP: OROMUCOSAL | Status: DC

## 2011-10-22 NOTE — Telephone Encounter (Signed)
Per [facility] to give MMW #4oz s&s qid with 5 refills. Pt aware that Rx sent to pharmacy.

## 2011-12-16 ENCOUNTER — Other Ambulatory Visit: Payer: Self-pay | Admitting: Pulmonary Disease

## 2011-12-16 DIAGNOSIS — Z1231 Encounter for screening mammogram for malignant neoplasm of breast: Secondary | ICD-10-CM

## 2011-12-27 ENCOUNTER — Ambulatory Visit: Payer: 59

## 2012-01-03 ENCOUNTER — Ambulatory Visit
Admission: RE | Admit: 2012-01-03 | Discharge: 2012-01-03 | Disposition: A | Discharge status: Home / Self Care | Payer: 59 | Source: Ambulatory Visit | Attending: Pulmonary Disease | Admitting: Pulmonary Disease

## 2012-01-03 DIAGNOSIS — Z1231 Encounter for screening mammogram for malignant neoplasm of breast: Secondary | ICD-10-CM

## 2012-03-17 ENCOUNTER — Ambulatory Visit (INDEPENDENT_AMBULATORY_CARE_PROVIDER_SITE_OTHER): Payer: 59 | Admitting: Pulmonary Disease

## 2012-03-17 ENCOUNTER — Other Ambulatory Visit (INDEPENDENT_AMBULATORY_CARE_PROVIDER_SITE_OTHER): Payer: 59

## 2012-03-17 ENCOUNTER — Ambulatory Visit (INDEPENDENT_AMBULATORY_CARE_PROVIDER_SITE_OTHER)
Admission: RE | Admit: 2012-03-17 | Discharge: 2012-03-17 | Disposition: A | Discharge status: Home / Self Care | Payer: 59 | Source: Ambulatory Visit | Attending: Pulmonary Disease | Admitting: Pulmonary Disease

## 2012-03-17 ENCOUNTER — Encounter: Payer: Self-pay | Admitting: Pulmonary Disease

## 2012-03-17 VITALS — BP 136/74 | HR 74 | Temp 96.9°F | Ht 62.0 in | Wt 242.4 lb

## 2012-03-17 DIAGNOSIS — F411 Generalized anxiety disorder: Secondary | ICD-10-CM

## 2012-03-17 DIAGNOSIS — I10 Essential (primary) hypertension: Secondary | ICD-10-CM

## 2012-03-17 DIAGNOSIS — Z0000 Encounter for general adult medical examination without abnormal findings: Secondary | ICD-10-CM

## 2012-03-17 DIAGNOSIS — M1990 Unspecified osteoarthritis, unspecified site: Secondary | ICD-10-CM

## 2012-03-17 DIAGNOSIS — E7800 Pure hypercholesterolemia, unspecified: Secondary | ICD-10-CM

## 2012-03-17 DIAGNOSIS — F17200 Nicotine dependence, unspecified, uncomplicated: Secondary | ICD-10-CM

## 2012-03-17 DIAGNOSIS — E669 Obesity, unspecified: Secondary | ICD-10-CM

## 2012-03-17 DIAGNOSIS — K589 Irritable bowel syndrome without diarrhea: Secondary | ICD-10-CM

## 2012-03-17 LAB — CBC WITH DIFFERENTIAL/PLATELET
Basophils Relative: 0.5 % (ref 0.0–3.0)
Eosinophils Absolute: 0.2 10*3/uL (ref 0.0–0.7)
Eosinophils Relative: 1.9 % (ref 0.0–5.0)
Hemoglobin: 13.1 g/dL (ref 12.0–15.0)
Lymphocytes Relative: 19.4 % (ref 12.0–46.0)
MCHC: 32.3 g/dL (ref 30.0–36.0)
MCV: 89.2 fl (ref 78.0–100.0)
Neutro Abs: 6 10*3/uL (ref 1.4–7.7)
RBC: 4.55 Mil/uL (ref 3.87–5.11)
WBC: 8.7 10*3/uL (ref 4.5–10.5)

## 2012-03-17 LAB — LDL CHOLESTEROL, DIRECT: Direct LDL: 154.4 mg/dL

## 2012-03-17 LAB — BASIC METABOLIC PANEL
BUN: 12 mg/dL (ref 6–23)
Calcium: 8.8 mg/dL (ref 8.4–10.5)
Creatinine, Ser: 0.8 mg/dL (ref 0.4–1.2)

## 2012-03-17 LAB — HEPATIC FUNCTION PANEL
Bilirubin, Direct: 0.1 mg/dL (ref 0.0–0.3)
Total Bilirubin: 0.7 mg/dL (ref 0.3–1.2)
Total Protein: 7.8 g/dL (ref 6.0–8.3)

## 2012-03-17 LAB — LIPID PANEL
Cholesterol: 252 mg/dL — ABNORMAL HIGH (ref 0–200)
Total CHOL/HDL Ratio: 3
Triglycerides: 80 mg/dL (ref 0.0–149.0)
VLDL: 16 mg/dL (ref 0.0–40.0)

## 2012-03-17 MED ORDER — ROSUVASTATIN CALCIUM 5 MG PO TABS: 5.0000 mg | ORAL_TABLET | Freq: Every day | ORAL | Status: DC

## 2012-03-17 MED ORDER — DICYCLOMINE HCL 20 MG PO TABS: 20.0000 mg | ORAL_TABLET | Freq: Three times a day (TID) | ORAL | Status: DC | PRN

## 2012-03-17 MED ORDER — ALPRAZOLAM 0.5 MG PO TABS: ORAL_TABLET | ORAL | Status: DC

## 2012-03-17 NOTE — Progress Notes (Signed)
Subjective:    Patient ID: Dana [name], female    DOB: 1966-06-08, 46 y.o.   MRN: [id number]  HPI 46 y/o BF here for a follow up visit...  ~  January 12, 2010:  Yearly ROV & CPX> recent stress w/ f/u mammogram showing enlarging lesion right breast... had sm nodule (~4mm) at 6 o'clock position right breast 4/10 w/ core biopsy = fibroadenoma... f/u mammogram 4/11 w/ 1.6 x 1cm lesion w/ lobulation... she is referred to [facility] & awaiting consult for excision 5/2 DrRosenbower... she has lost 20# this yr on diet & walking... BP still borderline controlled on diet alone (she refused meds) w/ BP 130/90 today... she was started on Prav40 for her Chol last yr but she stopped it on her own 2 mo ago... still smoking several cigs per day & not motivated to quit.  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  ~  March 17, 2012:  83mo ROV & CPX> Aqua stopped her Prav40 last yr due to upset stomach (sounded more like IBS) & didn't call to notify us or deal w/ the side effect, she has been off it for many months & her FLP is reranged at baseline w/ LDL=154; we discussed options & she agreed to try LOW DOSE Crestor5mg /d...  She wants refills of her Xanax & Vicodin please...    We reviewed prob list, meds, xrays and labs> see below for updates>> CXR 7/13 showed heart at upper lim norm, lungs clear, DJD in spine, NAD.Marland KitchenMarland Kitchen EKG 7/13 showed NSR, rate81,  WNL, NAD... LABS 7/13:  FLP- not at goal & Cres5 started;  Chems- wnl;  CBC- wnl;  TSH=1.30;           Problem List:  CIGARETTE SMOKER (ICD-305.1) - still smokes several cigarettes per day (~1/4ppd she says)... we discussed smoking cessation strategies and offered counselling, patches, Chantix, etc... she declines smoking cessation help & does not appear motivated to quit... She denies cough, sputum, hemoptysis, SOB, etc... ~  CXR 4/12 showed clear lungs, WNL.[name]. ~  7/13:  We reviewed smoking cessation strategies...  HYPERTENSION, MILD (ICD-401.1) - borderline HBP- ?related to her weight... we prev prescribed LisinoprilL/Hct  Aug08 but she didn't follow up and stopped this on her own... she prefers to treat this without meds & we discussed diet + exercise needed...   ~  4/11:  BP=130/90> denies HA, fatigue, visual changes, CP, palipit, dizziness, syncope, dyspnea, edema, etc; pt advised to get a BP cuff and start monitoring BP at home, get on diet- 2 gm sodium, 1200 cal/d weight reducing diet, and incease exercise program... ~  4/12:  BP still ~128/82 despite 12# wt gain over the last yr... she declines medRx. ~  7/13:  BP= 136/74 & she is reminded to get wt down, avoid salt, etc...  HYPERCHOLESTEROLEMIA (ICD-272.0) -  she refused to fill Simva Rx in past & was rec to take Prav40 but she has refused all meds for Chol. ~  FLP 8/08 showed TChol 244, TG 56, HDL 66, LDL 168- Simvastatin recommended, but pt refused. ~  FLP 3/10 showed TChol 247, TG 52, HDL 71, LDL 166... rec> try Pravastatin40 + diet & exercise. ~  FLP 4/11 off Prav40 x94mo showed TChol 240, TG 50, HDL 76, LDL158... rec> get bck on Prav40 (she never did) ~  FLP 4/12 on diet alone showed TChol 233, TG 30, HDL 72, LDL 154... She is rec to take the statin, but she prefers diet alone... ~  FLP 7/13 on "diet" showed TChol 252, TG 80, HDL 85, LDL 154.[name] She is rec to start CRESTOR 5mg /d...  OBESITY (ICD-278.00) - diet/ exercise discussed w/  pt (again)... she refuses to consider bariatric approach... in her 5\' 4"  frame her BMI has been around 40... ~  weight 2/07 = 228# ~  weight 8/08 = 237# ~  weight 3/09 = 243# ~  weight 3/10 = 240# ~  weight 4/11 = 220# ~  weight 4/12 = 232# ~  Weight 7/13 = 242#  BREAST MASS, RIGHT (ICD-611.72) - benign fibroadenoma removed by DrRosenbower 6/11... ~  4/12: she had a f/u mammogram at [facility]= neg... ~  4/13:  F/u mammogram was neg...  DEGENERATIVE JOINT DISEASE (ICD-715.90) BACK PAIN WITH RADICULOPATHY (ICD-729.2) - presented 3/09 w/ left sided back pain into left leg... eval by DrDean & DrNitka w/  L2-3 microdiscectomy 4/09 by Susann Givens... ~  Recurrent LBP treated w/ rest, ice/heat, Advil, VICODIN Tid prn... Hx of HEADACHE (ICD-784.0)  ANXIETY (ICD-300.00) - on ALPRAZOLAM 0.5mg  Tid Prn...   Past Surgical History  Procedure Date  . Tubal ligation   . Lumbar disc surgery     Outpatient Encounter Prescriptions as of 03/17/2012  Medication Sig Dispense Refill  . ALPRAZolam (XANAX) 0.5 MG tablet 1/2 to 1 tablet three times daily as needed for nerves  90 tablet  5  . Diphenhyd-Hydrocort-Nystatin (FIRST-DUKES MOUTHWASH) SUSP Swish and swallow qid  120 mL  5  . HYDROcodone-acetaminophen (VICODIN) 5-500 MG per tablet Take 1 tablet by mouth every 6 (six) hours as needed for pain.  20 tablet  0  . pravastatin (PRAVACHOL) 40 MG tablet Take 1 tablet (40 mg total) by mouth daily.  30 tablet  11    Allergies  Allergen Reactions  . Penicillins     REACTION: itching and swelling    Review of Systems        The patient complains of anxiety.  The patient denies fever, chills, sweats, anorexia, fatigue, weakness, malaise, weight loss, sleep disorder, blurring, diplopia, eye irritation, eye discharge, vision loss, eye pain, photophobia, earache, ear discharge, tinnitus, decreased hearing, nasal congestion, nosebleeds, sore throat, hoarseness, chest pain, palpitations, syncope, dyspnea on exertion,  orthopnea, PND, peripheral edema, cough, dyspnea at rest, excessive sputum, hemoptysis, wheezing, pleurisy, nausea, vomiting, diarrhea, constipation, change in bowel habits, abdominal pain, melena, hematochezia, jaundice, gas/bloating, indigestion/heartburn, dysphagia, odynophagia, dysuria, hematuria, urinary frequency, urinary hesitancy, nocturia, incontinence, back pain, joint pain, joint swelling, muscle cramps, muscle weakness, stiffness, arthritis, sciatica, restless legs, leg pain at night, leg pain with exertion, rash, itching, dryness, suspicious lesions, paralysis, paresthesias, seizures, tremors, vertigo, transient blindness, frequent falls, frequent headaches, difficulty walking, depression, memory loss, confusion, cold intolerance, heat intolerance, polydipsia, polyphagia, polyuria, unusual weight change, abnormal bruising, bleeding, enlarged lymph nodes, urticaria, allergic rash, hay fever, and recurrent infections.  Objective:   Physical Exam     WD, Obese, 45 y/o BF in NAD... GENERAL:  Alert & oriented; pleasant & cooperative... HEENT:  [place]/AT, EOM-wnl, PERRLA, EACs-clear, TMs-wnl, NOSE-clear, THROAT-clear & wnl. NECK:  Supple w/ fairROM; no JVD; normal carotid impulses w/o bruits; no thyromegaly or nodules palpated; no lymphadenopathy. CHEST:  Clear to P & A; without wheezes/ rales/ or rhonchi. HEART:  Regular Rhythm; without murmurs/ rubs/ or gallops. ABDOMEN:  Obese, soft & nontender; normal bowel sounds; no organomegaly or masses detected. EXT: without deformities, mild arthritic changes; no varicose veins/ +venous insuffic/ no edema. NEURO:  CN's intact;  no focal neuro deficits... DERM:  No lesions noted; no rash etc...  RADIOLOGY DATA:  Reviewed in the EPIC EMR & discussed w/ the patient...  LABORATORY DATA:  Reviewed in the EPIC EMR & discussed w/ the patient...   Assessment & Plan:   CPX>  CXR & labs reviewed> main finding is abn FLP w/ incr TChol & LDL as above;  We  tried PRAV40 last yr & she stopped it on her own for some minor IBS symptoms; Rec to try CRESTOR 5mg /d now...  HBP>  Borderline, again she doesn't want meds, so improvement will be depend on diet wt reduction...  CHOL>  As above, prev refused meds, we reviewed diet & will try low dose statin w/ Cres5...  Obesity>  Ditto, everything is dependent on her losing weight...  DJD>  She will f/u w/ DrDean & Susann Givens, her Orthopedists... She wants Vicodin refilled...  Anxiety>  On Alprazolam prn...   Patient's Medications  New Prescriptions   DICYCLOMINE (BENTYL) 20 MG TABLET    Take 1 tablet (20 mg total) by mouth 3 (three) times daily as needed.   ROSUVASTATIN (CRESTOR) 5 MG TABLET    Take 1 tablet (5 mg total) by mouth daily.  Previous Medications   HYDROCODONE-ACETAMINOPHEN (VICODIN) 5-500 MG PER TABLET    Take 1 tablet by mouth every 6 (six) hours as needed for pain.   PRAVASTATIN (PRAVACHOL) 40 MG TABLET    Take 1 tablet (40 mg total) by mouth daily.  Modified Medications   Modified Medication Previous Medication   ALPRAZOLAM (XANAX) 0.5 MG TABLET ALPRAZolam (XANAX) 0.5 MG tablet      1/2 to 1 tablet three times daily as needed for nerves    1/2 to 1 tablet three times daily as needed for nerves  Discontinued Medications   DIPHENHYD-HYDROCORT-NYSTATIN (FIRST-DUKES MOUTHWASH) SUSP    Swish and swallow qid

## 2012-03-17 NOTE — Patient Instructions (Addendum)
Today we updated your med list in our EPIC system...    Continue your current medications the same...    We refilled your Xanax per request...    You should get your pain pill filled by your Orthopedist who is treating that condition...  We decided to try a low dose of CRESTOR- 5mg  once daily for your Cholesterol...  We will arrange for a GI evaluation due to your abd discomfort, nausea, & change in bowel habits...    In the meanwhile, try the BENTYL 20mg - one tab up to 3 times daily as needed for the discomfort...  Today we did your CXR, EKG & FASTING blood work...    We will call you w/ the results when avail...  Call for any questions...  Let's plan a f/u Lipid profile in about 3months (on the Crestor).Marland KitchenMarland Kitchen

## 2012-03-22 ENCOUNTER — Encounter: Payer: Self-pay | Admitting: Pulmonary Disease

## 2012-04-09 ENCOUNTER — Encounter: Payer: Self-pay | Admitting: *Deleted

## 2012-04-15 ENCOUNTER — Encounter: Payer: Self-pay | Admitting: Internal Medicine

## 2012-04-15 ENCOUNTER — Ambulatory Visit (INDEPENDENT_AMBULATORY_CARE_PROVIDER_SITE_OTHER): Payer: 59 | Admitting: Internal Medicine

## 2012-04-15 VITALS — BP 134/96 | HR 76 | Ht 65.0 in | Wt 243.4 lb

## 2012-04-15 DIAGNOSIS — K529 Noninfective gastroenteritis and colitis, unspecified: Secondary | ICD-10-CM

## 2012-04-15 DIAGNOSIS — R1030 Lower abdominal pain, unspecified: Secondary | ICD-10-CM

## 2012-04-15 DIAGNOSIS — R197 Diarrhea, unspecified: Secondary | ICD-10-CM

## 2012-04-15 DIAGNOSIS — R109 Unspecified abdominal pain: Secondary | ICD-10-CM

## 2012-04-15 DIAGNOSIS — R112 Nausea with vomiting, unspecified: Secondary | ICD-10-CM

## 2012-04-15 MED ORDER — GLYCOPYRROLATE 2 MG PO TABS: 2.0000 mg | ORAL_TABLET | Freq: Two times a day (BID) | ORAL | Status: DC

## 2012-04-15 MED ORDER — ONDANSETRON HCL 4 MG PO TABS: 4.0000 mg | ORAL_TABLET | Freq: Three times a day (TID) | ORAL | Status: AC | PRN

## 2012-04-15 NOTE — Progress Notes (Signed)
Subjective:    Patient ID: Dana [name], female    DOB: 01-Jun-1966, 46 y.o.   MRN: [id number] Referred by: Michele Mcalpine, MD  HPI The patient is a very pleasant married 46 year old African American woman has had a several month history or more of nausea and vomiting, regurgitation, diarrhea and abdominal pain. Most of this is a postprandial phenomenon associated with nausea an urgent defecation that is loose, and sometimes associated with regurgitation or forceful vomiting. She has lost a few pounds in the last several months. She has not had problems like this before. There is crampy lower abdominal pain associated with it. She has not noted any medications associated with this, food associated and there's been no travel or sick contacts. She denies new significant stressors. She has not tried any over-the-counter medications for this. She was prescribed dicyclomine 20 mg every 6 hours as needed but says that has not helped. She does not notice any bleeding with her stools or vomiting. She does say her appetite is off, or that she is mainly afraid to eat because of the symptoms.  All other GI review of systems is negative.  Medications, allergies, past medical history, past surgical history, family history and social history are reviewed and updated in the EMR.  Review of Systems Positive for subjective fever, rhinorrhea, chronic left knee pain, she says she has bone on. She denies any urinary problems. The remainder of an entire review of systems is negative except as reflected in the history of present illness.    Objective:   Physical Exam General:  Well-developed, well-nourished and in no acute distress- obese Eyes:  anicteric. ENT:   Mouth and posterior pharynx free of lesions.  Neck:   supple w/o thyromegaly or mass.  Lungs: Clear to auscultation bilaterally. Heart:  S1S2, no rubs, murmurs, gallops. Abdomen:  soft, non-tender, no hepatosplenomegaly, hernia, or mass and BS+.    Rectal: deferred Lymph:  no cervical or supraclavicular adenopathy. Extremities:   no edema Skin   no rash. Neuro:  A&O x 3.  Psych:  appropriate mood and affect though transiently anxious and tearful   Data Reviewed: Lab Results  Component Value Date   WBC 8.7 03/17/2012   HGB 13.1 03/17/2012   HCT 40.6 03/17/2012   MCV 89.2 03/17/2012   PLT 267.0 03/17/2012     Chemistry      Component Value Date/Time   NA 139 03/17/2012 1016   K 4.2 03/17/2012 1016   CL 102 03/17/2012 1016   CO2 28 03/17/2012 1016   BUN 12 03/17/2012 1016   CREATININE 0.8 03/17/2012 1016      Component Value Date/Time   CALCIUM 8.8 03/17/2012 1016   ALKPHOS 64 03/17/2012 1016   AST 26 03/17/2012 1016   ALT 25 03/17/2012 1016   BILITOT 0.7 03/17/2012 1016     Lab Results  Component Value Date   TSH 1.30 03/17/2012          Assessment & Plan:   1. Nausea with vomiting   2. Lower abdominal pain   3. Chronic diarrhea    This constellation of symptoms could represent inflammatory bowel disease, severe IBS, long-term infection though doubt that. It needs further workup.  1. Stool for ova and parasite screen and fecal lactoferrin 2. Colonoscopy to investigate these symptoms less something turns up on the studies which will be collected in the next day or 2.The risks and benefits as well as alternatives of endoscopic procedure(s) have been  discussed and reviewed. All questions answered. The patient agrees to proceed. 3. Glycopyrrolate 2 mg twice a day will be tried since dicyclomine was ineffective.  I appreciate the opportunity to care for this patient.   CC: Michele Mcalpine, MD

## 2012-04-15 NOTE — Patient Instructions (Addendum)
You have been given a separate informational sheet regarding your tobacco use, the importance of quitting and local resources to help you quit. Your physician has requested that you go to the basement for the following lab work before leaving today: Stool studies  We have sent the following medications to your pharmacy for you to pick up at your convenience: Zofran, Robinul   Stop your Bentyl.  You have been scheduled for an endoscopy and colonoscopy with propofol. Please follow the written instructions given to you at your visit today. Please pick up your prep at the pharmacy within the next 1-3 days. If you use inhalers (even only as needed), please bring them with you on the day of your procedure.  Thank you for choosing me and [facility].  Iva Boop, M.D., [facility]

## 2012-04-16 ENCOUNTER — Other Ambulatory Visit: Payer: 59

## 2012-04-16 DIAGNOSIS — R1030 Lower abdominal pain, unspecified: Secondary | ICD-10-CM

## 2012-04-16 DIAGNOSIS — R112 Nausea with vomiting, unspecified: Secondary | ICD-10-CM

## 2012-04-16 DIAGNOSIS — K529 Noninfective gastroenteritis and colitis, unspecified: Secondary | ICD-10-CM

## 2012-04-17 LAB — FECAL LACTOFERRIN, QUANT: Lactoferrin: NEGATIVE

## 2012-04-20 LAB — OVA AND PARASITE SCREEN: OP: NONE SEEN

## 2012-04-21 ENCOUNTER — Telehealth: Payer: Self-pay | Admitting: Internal Medicine

## 2012-04-21 MED ORDER — MOVIPREP 100 G PO SOLR: ORAL | Status: DC

## 2012-04-21 NOTE — Telephone Encounter (Signed)
Pt informed that Moviprep has been sent to [facility] as requested.

## 2012-04-24 ENCOUNTER — Encounter: Payer: Self-pay | Admitting: Internal Medicine

## 2012-04-24 ENCOUNTER — Ambulatory Visit (AMBULATORY_SURGERY_CENTER): Payer: 59 | Admitting: Internal Medicine

## 2012-04-24 VITALS — BP 136/66 | HR 67 | Temp 98.1°F | Resp 18 | Ht 65.0 in | Wt 243.0 lb

## 2012-04-24 DIAGNOSIS — R1030 Lower abdominal pain, unspecified: Secondary | ICD-10-CM

## 2012-04-24 DIAGNOSIS — D126 Benign neoplasm of colon, unspecified: Secondary | ICD-10-CM

## 2012-04-24 DIAGNOSIS — R109 Unspecified abdominal pain: Secondary | ICD-10-CM

## 2012-04-24 DIAGNOSIS — R197 Diarrhea, unspecified: Secondary | ICD-10-CM

## 2012-04-24 DIAGNOSIS — K589 Irritable bowel syndrome without diarrhea: Secondary | ICD-10-CM

## 2012-04-24 DIAGNOSIS — R112 Nausea with vomiting, unspecified: Secondary | ICD-10-CM

## 2012-04-24 HISTORY — PX: COLONOSCOPY W/ BIOPSIES: SHX1374

## 2012-04-24 MED ORDER — SODIUM CHLORIDE 0.9 % IV SOLN: 500.0000 mL | INTRAVENOUS | Status: DC

## 2012-04-24 NOTE — Progress Notes (Signed)
PATIENT STATING SHE HAD A "SIP" OF H20 AT 1315 ON THE WAY TO THIS CLINIC. INFORMED DR. Leone Payor AND DEBRA MERRICK CRNA. PATIENT SEEN BY DEBRA MERRICK CRNA AND CLEARED FOR PROCEDURE AT SCHEDULED TIME.

## 2012-04-24 NOTE — Progress Notes (Signed)
Patient did not experience any of the following events: a burn prior to discharge; a fall within the facility; wrong site/side/patient/procedure/implant event; or a hospital transfer or hospital admission upon discharge from the facility. (G8907) Patient did not have preoperative order for IV antibiotic SSI prophylaxis. (G8918)  

## 2012-04-24 NOTE — Op Note (Signed)
[facility] [address] [place], [place]  [address]  COLONOSCOPY PROCEDURE REPORT  PATIENT:  Dana [name], Dana [name]  MR#:  [id number] BIRTHDATE:  12-10-1965, 45 yrs. old  GENDER:  female ENDOSCOPIST:  Iva Boop, MD, [facility] REF. BY:  Alroy Dust, M.D. PROCEDURE DATE:  04/24/2012 PROCEDURE:  Colonoscopy with biopsy ASA CLASS:  Class II INDICATIONS:  unexplained diarrhea, Abdominal pain MEDICATIONS:   There was residual sedation effect present from prior procedure., These medications were titrated to patient response per physician's verbal order, MAC sedation, administered by CRNA, propofol (Diprivan) 250 mg IV  DESCRIPTION OF PROCEDURE:   After the risks benefits and alternatives of the procedure were thoroughly explained, informed consent was obtained.  Digital rectal exam was performed and revealed no abnormalities.   The LB [id number] endoscope was introduced through the anus and advanced to the terminal ileum which was intubated for a short distance, without limitations. The quality of the prep was excellent, using MoviPrep.  The instrument was then slowly withdrawn as the colon was fully examined. <<PROCEDUREIMAGES>>  FINDINGS:  The terminal ileum appeared normal.  A normal appearing cecum, ileocecal valve, and appendiceal orifice were identified. The ascending, hepatic flexure, transverse, splenic flexure, descending, sigmoid colon, and rectum appeared unremarkable. Random biopsies were obtained and sent to pathology.   Retroflexed views in the rectum revealed no abnormalities.    The time to cecum = 1:16 minutes. The scope was then withdrawn in 5:05 minutes from the cecum and the procedure completed. COMPLICATIONS:  None ENDOSCOPIC IMPRESSION: 1) Normal terminal ileum 2) Normal colon RECOMMENDATIONS: 1) Await biopsy results 2) Continue glycopyrrolate REPEAT EXAM:  In for Colonoscopy, pending biopsy results.  Iva Boop, MD, [facility]  CC:  Michele Mcalpine, MD and The Patient  n. eSIGNED:   Iva Boop at 04/24/2012 03:15 PM  Geoffery Spruce, [phone]

## 2012-04-24 NOTE — Op Note (Signed)
[facility] [address] [place], [place]  [address]  ENDOSCOPY PROCEDURE REPORT  PATIENT:  Dana, [name]  MR#:  [id number] BIRTHDATE:  08-12-1966, 45 yrs. old  GENDER:  female  ENDOSCOPIST:  Iva Boop, MD, [facility] Referred by:  Alroy Dust, M.D.  PROCEDURE DATE:  04/24/2012 PROCEDURE:  EGD, diagnostic [address] ASA CLASS:  Class II INDICATIONS:  nausea and vomiting, abdominal pain  MEDICATIONS:   These medications were titrated to patient response per physician's verbal order, MAC sedation, administered by CRNA, propofol (Diprivan) 250 mg IV TOPICAL ANESTHETIC:  Cetacaine Spray  DESCRIPTION OF PROCEDURE:   After the risks benefits and alternatives of the procedure were thoroughly explained, informed consent was obtained.  The LB [id number] endoscope was introduced through the mouth and advanced to the second portion of the duodenum, without limitations.  The instrument was slowly withdrawn as the mucosa was fully examined. <<PROCEDUREIMAGES>>  The upper, middle, and distal third of the esophagus were carefully inspected and no abnormalities were noted. The z-line was well seen at the GEJ. The endoscope was pushed into the fundus which was normal including a retroflexed view. The antrum,gastric body, first and second part of the duodenum were unremarkable. Retroflexed views revealed no abnormalities.    The scope was then withdrawn from the patient and the procedure completed.  COMPLICATIONS:  None  ENDOSCOPIC IMPRESSION: 1) Normal EGD RECOMMENDATIONS: Colonoscopy next  Iva Boop, MD, [facility]  CC:  Michele Mcalpine, MD and The Patient  n. eSIGNED:   Iva Boop at 04/24/2012 03:12 PM  Geoffery Spruce, [phone]

## 2012-04-24 NOTE — Patient Instructions (Addendum)
The esophagus, stomach and duodenum were normal.v the colon, rectum and end of the small intestine were normal. The stool tests I took before were also normal.  I suspect your problem is Irritable Bowel Syndrome - spasms of the gastrointestinal muscles and sensitivity of the nerves in the walls of the gut.  Please use the glycopyrrolate to help with your symptoms.  We will call with biopsy results and plans.  Thank you for choosing me and [facility].  Iva Boop, MD, FACG  YOU HAD AN ENDOSCOPIC PROCEDURE TODAY AT THE [facility]: Refer to the procedure report that was given to you for any specific questions about what was found during the examination.  If the procedure report does not answer your questions, please call your gastroenterologist to clarify.  If you requested that your care partner not be given the details of your procedure findings, then the procedure report has been included in a sealed envelope for you to review at your convenience later.  YOU SHOULD EXPECT: Some feelings of bloating in the abdomen. Passage of more gas than usual.  Walking can help get rid of the air that was put into your GI tract during the procedure and reduce the bloating. If you had a lower endoscopy (such as a colonoscopy or flexible sigmoidoscopy) you may notice spotting of blood in your stool or on the toilet paper. If you underwent a bowel prep for your procedure, then you may not have a normal bowel movement for a few days.  DIET: Your first meal following the procedure should be a light meal and then it is ok to progress to your normal diet.  A half-sandwich or bowl of soup is an example of a good first meal.  Heavy or fried foods are harder to digest and may make you feel nauseous or bloated.  Likewise meals heavy in dairy and vegetables can cause extra gas to form and this can also increase the bloating.  Drink plenty of fluids but you should avoid alcoholic beverages for  24 hours.  ACTIVITY: Your care partner should take you home directly after the procedure.  You should plan to take it easy, moving slowly for the rest of the day.  You can resume normal activity the day after the procedure however you should NOT DRIVE or use heavy machinery for 24 hours (because of the sedation medicines used during the test).    SYMPTOMS TO REPORT IMMEDIATELY: A gastroenterologist can be reached at any hour.  During normal business hours, 8:30 AM to 5:00 PM Monday through Friday, call [phone].  After hours and on weekends, please call the GI answering service at [phone] who will take a message and have the physician on call contact you.   Following lower endoscopy (colonoscopy or flexible sigmoidoscopy):  Excessive amounts of blood in the stool  Significant tenderness or worsening of abdominal pains  Swelling of the abdomen that is new, acute  Fever of 100F or higher  Following upper endoscopy (EGD)  Vomiting of blood or coffee ground material  New chest pain or pain under the shoulder blades  Painful or persistently difficult swallowing  New shortness of breath  Fever of 100F or higher  Black, tarry-looking stools  FOLLOW UP: If any biopsies were taken you will be contacted by phone or by letter within the next 1-3 weeks.  Call your gastroenterologist if you have not heard about the biopsies in 3 weeks.  Our staff will call the  home number listed on your records the next business day following your procedure to check on you and address any questions or concerns that you may have at that time regarding the information given to you following your procedure. This is a courtesy call and so if there is no answer at the home number and we have not heard from you through the emergency physician on call, we will assume that you have returned to your regular daily activities without incident.  SIGNATURES/CONFIDENTIALITY: You and/or your care partner have signed  paperwork which will be entered into your electronic medical record.  These signatures attest to the fact that that the information above on your After Visit Summary has been reviewed and is understood.  Full responsibility of the confidentiality of this discharge information lies with you and/or your care-partner.

## 2012-04-27 ENCOUNTER — Telehealth: Payer: Self-pay | Admitting: *Deleted

## 2012-04-27 NOTE — Telephone Encounter (Signed)
  Follow up Call-  Call back number 04/24/2012  Post procedure Call Back phone  # [phone]  Permission to leave phone message Yes     Patient questions:  Message left to call us if necessary.

## 2012-04-29 NOTE — Progress Notes (Signed)
Quick Note:  OFFICE  Biopsies are normal - diagnosis is IBS at this point  1) continue glycopyrrolate and if satisfied we can do long-term refill 2) schedule REV 6-8 weeks to regroup and review to make sure she is doing ok 3) routine colonoscopy in 10 years (2023)  [facility]  1) no letter 2) colon recall 04/2022  ______

## 2012-05-27 ENCOUNTER — Other Ambulatory Visit: Payer: Self-pay | Admitting: Pulmonary Disease

## 2012-05-27 MED ORDER — ALPRAZOLAM 0.5 MG PO TABS: ORAL_TABLET | ORAL | Status: DC

## 2012-06-11 ENCOUNTER — Encounter: Payer: Self-pay | Admitting: Gynecology

## 2012-06-11 ENCOUNTER — Ambulatory Visit (INDEPENDENT_AMBULATORY_CARE_PROVIDER_SITE_OTHER): Payer: 59 | Admitting: Gynecology

## 2012-06-11 VITALS — BP 134/92 | Ht 63.5 in | Wt 245.0 lb

## 2012-06-11 DIAGNOSIS — R635 Abnormal weight gain: Secondary | ICD-10-CM

## 2012-06-11 DIAGNOSIS — N83209 Unspecified ovarian cyst, unspecified side: Secondary | ICD-10-CM

## 2012-06-11 DIAGNOSIS — Z01419 Encounter for gynecological examination (general) (routine) without abnormal findings: Secondary | ICD-10-CM

## 2012-06-11 DIAGNOSIS — N946 Dysmenorrhea, unspecified: Secondary | ICD-10-CM

## 2012-06-11 DIAGNOSIS — N920 Excessive and frequent menstruation with regular cycle: Secondary | ICD-10-CM

## 2012-06-11 DIAGNOSIS — N83202 Unspecified ovarian cyst, left side: Secondary | ICD-10-CM | POA: Insufficient documentation

## 2012-06-11 DIAGNOSIS — I10 Essential (primary) hypertension: Secondary | ICD-10-CM

## 2012-06-11 DIAGNOSIS — F17200 Nicotine dependence, unspecified, uncomplicated: Secondary | ICD-10-CM

## 2012-06-11 DIAGNOSIS — N393 Stress incontinence (female) (male): Secondary | ICD-10-CM

## 2012-06-11 MED ORDER — HYDROCHLOROTHIAZIDE 12.5 MG PO CAPS: 12.5000 mg | ORAL_CAPSULE | Freq: Every day | ORAL | Status: DC

## 2012-06-11 NOTE — Progress Notes (Signed)
Dana [name] 08/13/1966 [id number]   History:    46 y.o.  for annual gyn exam who presented in the office today as well complaining of low, no pain on and off for the past 5 months and stating that her periods are getting heavier. Review of her record indicated in July 2012 patient had an ultrasound which demonstrated she had a left ovarian cyst which measured 3.8 x 3.6 x 3.0 cm which is avascular and she was instructed to return back for followup ultrasound in 3 months and she did not. She states her cycles are regular there heavy lasting 5-7 days with cramps and has been having left lower quadrant discomfort. Review of her record indicated in April of this year she had a normal mammogram is doing her monthly self breast examination. Her primary physician is Dr.Nadel week she is scheduled to see in a few months. She was found to have elevated blood pressure readings of 134/92 for which patient was asymptomatic. She also had complaint leaking the past and stress urinary incontinence when she coughs or sneeze but did not return back for the urodynamic evaluation it was recommended. She does have nocturia whereby she gets up twice at night and her today has urinary frequency. Review of her record indicated she was weighing 238 instructed to 245 with a BMI of 42.72. Patient stated one month ago she had a colonoscopy in an EGD and was diagnosed with irritable bowel syndrome and a colonoscopy demonstrated 2 benign polyps were removed by Dr. Leone Payor.   Past medical history,surgical history, family history and social history were all reviewed and documented in the EPIC chart.  Gynecologic History Patient's last menstrual period was 05/17/2012. Contraception: tubal ligation Last Pap: 2012. Results were: normal Last mammogram: April 2013. Results were: normal  Obstetric History OB History    Grav Para Term Preterm Abortions TAB SAB Ect Mult Living   3 3 3      1 4      # Outc Date GA Lbr Len/2nd Wgt Sex  Del Anes PTL Lv   1 TRM     M SVD  No Yes   2 TRM     F SVD  No Yes   3A TRM     M SVD  No Yes   3B      M SVD  No Yes       ROS: A ROS was performed and pertinent positives and negatives are included in the history.  GENERAL: No fevers or chills. HEENT: No change in vision, no earache, sore throat or sinus congestion. NECK: No pain or stiffness. CARDIOVASCULAR: No chest pain or pressure. No palpitations. PULMONARY: No shortness of breath, cough or wheeze. GASTROINTESTINAL: No abdominal pain, nausea, vomiting or diarrhea, melena or bright red blood per rectum. GENITOURINARY: No urinary frequency, urgency, hesitancy or dysuria. MUSCULOSKELETAL: No joint or muscle pain, no back pain, no recent trauma. DERMATOLOGIC: No rash, no itching, no lesions. ENDOCRINE: No polyuria, polydipsia, no heat or cold intolerance. No recent change in weight. HEMATOLOGICAL: No anemia or easy bruising or bleeding. NEUROLOGIC: No headache, seizures, numbness, tingling or weakness. PSYCHIATRIC: No depression, no loss of interest in normal activity or change in sleep pattern.     Exam: chaperone present  BP 134/92  Ht 5' 3.5" (1.613 m)  Wt 245 lb (111.131 kg)  BMI 42.72 kg/m2  LMP 05/17/2012  Body mass index is 42.72 kg/(m^2).  General appearance : Well developed well nourished female. No acute  distress HEENT: Neck supple, trachea midline, no carotid bruits, no thyroidmegaly Lungs: Clear to auscultation, no rhonchi or wheezes, or rib retractions  Heart: Regular rate and rhythm, no murmurs or gallops Breast:Examined in sitting and supine position were symmetrical in appearance, no palpable masses or tenderness,  no skin retraction, no nipple inversion, no nipple discharge, no skin discoloration, no axillary or supraclavicular lymphadenopathy Abdomen: no palpable masses or tenderness, no rebound or guarding Extremities: no edema or skin discoloration or tenderness  Pelvic:  Bartholin, Urethra, Skene Glands:  Within normal limits             Vagina: No gross lesions or discharge  Cervix: No gross lesions or discharge  Uterus  slightly anteverted, normal size, shape and consistency, non-tender and mobile  Adnexa fullness left adnexa with tenderness Anus and perineum  normal   Rectovaginal  normal sphincter tone without palpated masses or tenderness             Hemoccult not done     Assessment/Plan:  46 y.o. female for annual exam patient was found to be hypertensive when she came to the office. Her blood pressure was repeated again and her systolic was near 140 and diastolic mid 90s. We are going to start her on HCTZ 12.5 mg daily. She scheduled to return back next week for an ultrasound to followup on the ovarian cyst will be rechecking her blood pressure again. It was recommended that she followup also with her internist Dr. Kriste Basque. He has been doing her lab work and she was recently put on Crestor for hyperlipidemia. We discussed the new Pap smear screening guidelines and she will not need 1 this year. She will schedule for ultrasound for next week. She will schedule a urodynamic test 2-3 weeks from now as well. Patient was counseled on the detrimental effects of smoking. Literature information was provided on subject as well as on ovarian cyst, diet, and exercise.   Ok Edwards MD, 5:45 PM 06/11/2012

## 2012-06-11 NOTE — Patient Instructions (Addendum)
Diphtheria, Tetanus, and Pertussis (DTaP) Vaccine What You Need to Know WHY GET VACCINATED? Diphtheria, tetanus, and pertussis are serious diseases caused by bacteria. Diphtheria and pertussis are spread from person to person. Tetanus enters the body through cuts or wounds. Diphtheria causes a thick covering in the back of the throat.  It can lead to breathing problems, paralysis, heart failure, and even death.  Tetanus (Lockjaw) causes painful tightening of the muscles, usually all over the body.  It can lead to "locking" of the jaw so the victim cannot open his or her mouth or swallow. Tetanus leads to death in about 2 out of 10 cases.  Pertussis (Whooping Cough) causes coughing spells so bad that it is hard for infants to eat, drink, or breathe. These spells can last for weeks.  It can lead to pneumonia, seizures (jerking and staring spells), brain damage, and death.  Diphtheria, tetanus, and pertussis vaccine (DTaP) can help prevent these diseases. Most children who are vaccinated with DTaP will be protected throughout childhood. Many more children would get these diseases if we stopped vaccinating. DTaP is a safer version of an older vaccine called DTP. DTP is no longer used in the [place]. WHO SHOULD GET DTAP VACCINE AND WHEN? Children should get 5 doses of DTaP vaccine, 1 dose at each of the following ages:  2 months.   4 months.   6 months.   15 to 18 months.   4 to 6 years.  DTaP may be given at the same time as other vaccines. SOME CHILDREN SHOULD NOT GET DTAP VACCINE OR SHOULD WAIT  Children with minor illnesses, such as a cold, may be vaccinated. But children who are moderately or severely ill should usually wait until they recover before getting DTaP vaccine.   Any child who had a life-threatening allergic reaction after a dose of DTaP should not get another dose.   Any child who suffered a brain or nervous system disease within 7 days after a dose of DTaP should  not get another dose.   Talk with your caregiver if your child:   Had a seizure or collapsed after a dose of DTaP.   Cried non-stop for 3 hours or more after a dose of DTaP.   Had a fever over 105 F (40.6 C) after a dose of DTaP.   Ask your caregiver for more information. Some of these children should not get another dose of pertussis vaccine, but may get a vaccine without pertussis, called DT.  OLDER CHILDREN AND ADULTS  DTaP is not licensed for adolescents, adults, or children 1 years of age and older.   But older people still need protection. A vaccine called Tdap is similar to DTaP. A single dose of Tdap is recommended for people 11 through 47 years of age. Another vaccine, called Td, protects against tetanus and diphtheria, but not pertussis. It is recommended every 10 years.  WHAT ARE THE RISKS FROM DTAP VACCINE?  Getting diphtheria, tetanus, or pertussis disease is much riskier than getting DTaP vaccine.   However, a vaccine, like any medicine, is capable of causing serious problems, such as severe allergic reactions. The risk of DTaP vaccine causing serious harm, or death, is extremely small.  Mild Problems (Common)  Fever (up to about 1 child in 4).   Redness or swelling where the shot was given (up to about 1 child in 4).   Soreness or tenderness where the shot was given (up to about 1 child in 4).  These problems occur more often after the 4th and 5th doses of the DTaP series than after earlier doses. Sometimes the 4th or 5th dose of DTaP vaccine is followed by swelling of the entire arm or leg in which the shot was given, lasting 1 to 7 days (up to about 1 child in 30). Other mild problems include:  Fussiness (up to about 1 child in 3).   Tiredness or poor appetite (up to about 1 child in 10).   Vomiting (up to about 1 child in 50).  These problems generally occur 1 to 3 days after the shot. Moderate Problems (Uncommon)  Seizure (jerking or staring) (about 1 child  out of 14,000).   Non-stop crying, for 3 hours or more (up to about 1 child out of 1,000).   High fever, over 105 F (40.6 C) (about 1 child out of 16,000).  Severe Problems (Very Rare)  Serious allergic reaction (less than 1 out of a million doses).   Several other severe problems have been reported after DTaP vaccine. These include:   Long-term seizures, coma, or lowered consciousness.   Permanent brain damage.  These are so rare it is hard to tell if they are caused by the vaccine. Controlling fever is especially important for children who have had seizures, for any reason. It is also important if another family member has had seizures. You can reduce fever and pain by giving your child an aspirin-free pain reliever when the shot is given, and for the next 24 hours, following the package instructions. WHAT IF THERE IS A MODERATE OR SEVERE REACTION? What should I look for? Any unusual conditions, such as a serious allergic reaction, high fever, or unusual behavior. Serious allergic reactions are extremely rare with any vaccine. If one were to occur, it would most likely be within a few minutes to a few hours after the shot. Signs can include difficulty breathing, hoarseness or wheezing, hives, paleness, weakness, a fast heartbeat, or dizziness. If a high fever or seizure were to occur, it would usually be within a week after the shot. What should I do?  Call your caregiver or get the person to a caregiver right away.   Tell the caregiver what happened, the date and time it happened, and when the vaccination was given.   Ask the caregiver, nurse, or health department to file a Vaccine Adverse Event Reporting System (VAERS) form. Or, you can file this report through the VAERS website at www.vaers.LAgents.no or by calling 1-434-519-3013.  VAERS does not provide medical advice. THE NATIONAL VACCINE INJURY COMPENSATION PROGRAM  In the rare event that you or your child has a serious reaction  to a vaccine, a federal program has been created to help you pay for the care of those who have been harmed.   For details about the National Vaccine Injury Compensation Program, call 312 412 9355 or visit the program's website at SpiritualWord.at  HOW CAN I LEARN MORE?  Ask your caregiver. They can give you the vaccine package insert or suggest other sources of information.   Call your local or state health department's immunization program.   Contact the Centers for Disease Control and Prevention (CDC):   Call (863)718-8933 (1-800-CDC-INFO).   Visit the [employer] at PicCapture.uy  CDC Diphtheria, Tetanus, and Pertussis (DTaP) Vaccine VIS (01/30/06) Document Released: 06/30/2006 Document Revised: 08/22/2011 Document Reviewed: 06/30/2006 [facility] Patient Information 2012 [place], [place].  Ovarian Cyst The ovaries are small organs that are on each side of  the uterus. The ovaries are the organs that produce the female hormones, estrogen and progesterone. An ovarian cyst is a sac filled with fluid that can vary in its size. It is normal for a small cyst to form in women who are in the childbearing age and who have menstrual periods. This type of cyst is called a follicle cyst that becomes an ovulation cyst (corpus luteum cyst) after it produces the women's egg. It later goes away on its own if the woman does not become pregnant. There are other kinds of ovarian cysts that may cause problems and may need to be treated. The most serious problem is a cyst with cancer. It should be noted that menopausal women who have an ovarian cyst are at a higher risk of it being a cancer cyst. They should be evaluated very quickly, thoroughly and followed closely. This is especially true in menopausal women because of the high rate of ovarian cancer in women in menopause. CAUSES AND TYPES OF OVARIAN CYSTS:  FUNCTIONAL CYST: The follicle/corpus luteum cyst is a  functional cyst that occurs every month during ovulation with the menstrual cycle. They go away with the next menstrual cycle if the woman does not get pregnant. Usually, there are no symptoms with a functional cyst.   ENDOMETRIOMA CYST: This cyst develops from the lining of the uterus tissue. This cyst gets in or on the ovary. It grows every month from the bleeding during the menstrual period. It is also called a "chocolate cyst" because it becomes filled with blood that turns brown. This cyst can cause pain in the lower abdomen during intercourse and with your menstrual period.   CYSTADENOMA CYST: This cyst develops from the cells on the outside of the ovary. They usually are not cancerous. They can get very big and cause lower abdomen pain and pain with intercourse. This type of cyst can twist on itself, cut off its blood supply and cause severe pain. It also can easily rupture and cause a lot of pain.   DERMOID CYST: This type of cyst is sometimes found in both ovaries. They are found to have different kinds of body tissue in the cyst. The tissue includes skin, teeth, hair, and/or cartilage. They usually do not have symptoms unless they get very big. Dermoid cysts are rarely cancerous.   POLYCYSTIC OVARY: This is a rare condition with hormone problems that produces many small cysts on both ovaries. The cysts are follicle-like cysts that never produce an egg and become a corpus luteum. It can cause an increase in body weight, infertility, acne, increase in body and facial hair and lack of menstrual periods or rare menstrual periods. Many women with this problem develop type 2 diabetes. The exact cause of this problem is unknown. A polycystic ovary is rarely cancerous.   THECA LUTEIN CYST: Occurs when too much hormone (human chorionic gonadotropin) is produced and over-stimulates the ovaries to produce an egg. They are frequently seen when doctors stimulate the ovaries for invitro-fertilization (test  tube babies).   LUTEOMA CYST: This cyst is seen during pregnancy. Rarely it can cause an obstruction to the birth canal during labor and delivery. They usually go away after delivery.  SYMPTOMS   Pelvic pain or pressure.   Pain during sexual intercourse.   Increasing girth (swelling) of the abdomen.   Abnormal menstrual periods.   Increasing pain with menstrual periods.   You stop having menstrual periods and you are not pregnant.  DIAGNOSIS  The diagnosis  can be made during:  Routine or annual pelvic examination (common).   Ultrasound.   X-ray of the pelvis.   CT Scan.   MRI.   Blood tests.  TREATMENT   Treatment may only be to follow the cyst monthly for 2 to 3 months with your caregiver. Many go away on their own, especially functional cysts.   May be aspirated (drained) with a long needle with ultrasound, or by laparoscopy (inserting a tube into the pelvis through a small incision).   The whole cyst can be removed by laparoscopy.   Sometimes the cyst may need to be removed through an incision in the lower abdomen.   Hormone treatment is sometimes used to help dissolve certain cysts.   Birth control pills are sometimes used to help dissolve certain cysts.  HOME CARE INSTRUCTIONS  Follow your caregiver's advice regarding:  Medicine.   Follow up visits to evaluate and treat the cyst.   You may need to come back or make an appointment with another caregiver, to find the exact cause of your cyst, if your caregiver is not a gynecologist.   Get your yearly and recommended pelvic examinations and Pap tests.   Let your caregiver know if you have had an ovarian cyst in the past.  SEEK MEDICAL CARE IF:   Your periods are late, irregular, they stop, or are painful.   Your stomach (abdomen) or pelvic pain does not go away.   Your stomach becomes larger or swollen.   You have pressure on your bladder or trouble emptying your bladder completely.   You have  painful sexual intercourse.   You have feelings of fullness, pressure, or discomfort in your stomach.   You lose weight for no apparent reason.   You feel generally ill.   You become constipated.   You lose your appetite.   You develop acne.   You have an increase in body and facial hair.   You are gaining weight, without changing your exercise and eating habits.   You think you are pregnant.  SEEK IMMEDIATE MEDICAL CARE IF:   You have increasing abdominal pain.   You feel sick to your stomach (nausea) and/or vomit.   You develop a fever that comes on suddenly.   You develop abdominal pain during a bowel movement.   Your menstrual periods become heavier than usual.  Document Released: 09/02/2005 Document Revised: 08/22/2011 Document Reviewed: 07/06/2009 [facility] Patient Information 2012 [place], [place].  Smoking Cessation This document explains the best ways for you to quit smoking and new treatments to help. It lists new medicines that can double or triple your chances of quitting and quitting for good. It also considers ways to avoid relapses and concerns you may have about quitting, including weight gain. NICOTINE: A POWERFUL ADDICTION If you have tried to quit smoking, you know how hard it can be. It is hard because nicotine is a very addictive drug. For some people, it can be as addictive as heroin or cocaine. Usually, people make 2 or 3 tries, or more, before finally being able to quit. Each time you try to quit, you can learn about what helps and what hurts. Quitting takes hard work and a lot of effort, but you can quit smoking. QUITTING SMOKING IS ONE OF THE MOST IMPORTANT THINGS YOU WILL EVER DO.  You will live longer, feel better, and live better.   The impact on your body of quitting smoking is felt almost immediately:   Within 20  minutes, blood pressure decreases. Pulse returns to its normal level.   After 8 hours, carbon monoxide levels in the blood return to  normal. Oxygen level increases.   After 24 hours, chance of heart attack starts to decrease. Breath, hair, and body stop smelling like smoke.   After 48 hours, damaged nerve endings begin to recover. Sense of taste and smell improve.   After 72 hours, the body is virtually free of nicotine. Bronchial tubes relax and breathing becomes easier.   After 2 to 12 weeks, lungs can hold more air. Exercise becomes easier and circulation improves.   Quitting will reduce your risk of having a heart attack, stroke, cancer, or lung disease:   After 1 year, the risk of coronary heart disease is cut in half.   After 5 years, the risk of stroke falls to the same as a nonsmoker.   After 10 years, the risk of lung cancer is cut in half and the risk of other cancers decreases significantly.   After 15 years, the risk of coronary heart disease drops, usually to the level of a nonsmoker.   If you are pregnant, quitting smoking will improve your chances of having a healthy baby.   The people you live with, especially your children, will be healthier.   You will have extra money to spend on things other than cigarettes.  FIVE KEYS TO QUITTING Studies have shown that these 5 steps will help you quit smoking and quit for good. You have the best chances of quitting if you use them together: 1. Get ready.  2. Get support and encouragement.  3. Learn new skills and behaviors.  4. Get medicine to reduce your nicotine addiction and use it correctly.  5. Be prepared for relapse or difficult situations. Be determined to continue trying to quit, even if you do not succeed at first.  1. GET READY  Set a quit date.   Change your environment.   Get rid of ALL cigarettes, ashtrays, matches, and lighters in your home, car, and place of work.   Do not let people smoke in your home.   Review your past attempts to quit. Think about what worked and what did not.   Once you quit, do not smoke. NOT EVEN A PUFF!  2.  GET SUPPORT AND ENCOURAGEMENT Studies have shown that you have a better chance of being successful if you have help. You can get support in many ways.  Tell your family, friends, and coworkers that you are going to quit and need their support. Ask them not to smoke around you.   Talk to your caregivers (doctor, dentist, nurse, pharmacist, psychologist, and/or smoking counselor).   Get individual, group, or telephone counseling and support. The more counseling you have, the better your chances are of quitting. Programs are available at [employer] and health centers. Call your local health department for information about programs in your area.   Spiritual beliefs and practices may help some smokers quit.   Quit meters are Photographer that keep track of quit statistics, such as amount of "quit-time," cigarettes not smoked, and money saved.   Many smokers find one or more of the many self-help books available useful in helping them quit and stay off tobacco.  3. LEARN NEW SKILLS AND BEHAVIORS  Try to distract yourself from urges to smoke. Talk to someone, go for a walk, or occupy your time with a task.   When you first  try to quit, change your routine. Take a different route to work. Drink tea instead of coffee. Eat breakfast in a different place.   Do something to reduce your stress. Take a hot bath, exercise, or read a book.   Plan something enjoyable to do every day. Reward yourself for not smoking.   Explore interactive web-based programs that specialize in helping you quit.  4. GET MEDICINE AND USE IT CORRECTLY Medicines can help you stop smoking and decrease the urge to smoke. Combining medicine with the above behavioral methods and support can quadruple your chances of successfully quitting smoking. The [place] Food and Drug Administration (FDA) has approved 7 medicines to help you quit smoking. These medicines fall into 3 categories.  Nicotine  replacement therapy (delivers nicotine to your body without the negative effects and risks of smoking):   Nicotine gum: Available over-the-counter.   Nicotine lozenges: Available over-the-counter.   Nicotine inhaler: Available by prescription.   Nicotine nasal spray: Available by prescription.   Nicotine skin patches (transdermal): Available by prescription and over-the-counter.   Antidepressant medicine (helps people abstain from smoking, but how this works is unknown):   Bupropion sustained-release (SR) tablets: Available by prescription.   Nicotinic receptor partial agonist (simulates the effect of nicotine in your brain):   Varenicline tartrate tablets: Available by prescription.   Ask your caregiver for advice about which medicines to use and how to use them. Carefully read the information on the package.   Everyone who is trying to quit may benefit from using a medicine. If you are pregnant or trying to become pregnant, nursing an infant, you are under age 10, or you smoke fewer than 10 cigarettes per day, talk to your caregiver before taking any nicotine replacement medicines.   You should stop using a nicotine replacement product and call your caregiver if you experience nausea, dizziness, weakness, vomiting, fast or irregular heartbeat, mouth problems with the lozenge or gum, or redness or swelling of the skin around the patch that does not go away.   Do not use any other product containing nicotine while using a nicotine replacement product.   Talk to your caregiver before using these products if you have diabetes, heart disease, asthma, stomach ulcers, you had a recent heart attack, you have high blood pressure that is not controlled with medicine, a history of irregular heartbeat, or you have been prescribed medicine to help you quit smoking.  5. BE PREPARED FOR RELAPSE OR DIFFICULT SITUATIONS  Most relapses occur within the first 3 months after quitting. Do not be  discouraged if you start smoking again. Remember, most people try several times before they finally quit.   You may have symptoms of withdrawal because your body is used to nicotine. You may crave cigarettes, be irritable, feel very hungry, cough often, get headaches, or have difficulty concentrating.   The withdrawal symptoms are only temporary. They are strongest when you first quit, but they will go away within 10 to 14 days.  Here are some difficult situations to watch for:  Alcohol. Avoid drinking alcohol. Drinking lowers your chances of successfully quitting.   Caffeine. Try to reduce the amount of caffeine you consume. It also lowers your chances of successfully quitting.   Other smokers. Being around smoking can make you want to smoke. Avoid smokers.   Weight gain. Many smokers will gain weight when they quit, usually less than 10 pounds. Eat a healthy diet and stay active. Do not let weight gain distract you  from your main goal, quitting smoking. Some medicines that help you quit smoking may also help delay weight gain. You can always lose the weight gained after you quit.   Bad mood or depression. There are a lot of ways to improve your mood other than smoking.  If you are having problems with any of these situations, talk to your caregiver. SPECIAL SITUATIONS AND CONDITIONS Studies suggest that everyone can quit smoking. Your situation or condition can give you a special reason to quit.  Pregnant women/new mothers: By quitting, you protect your baby's health and your own.   Hospitalized patients: By quitting, you reduce health problems and help healing.   Heart attack patients: By quitting, you reduce your risk of a second heart attack.   Lung, head, and neck cancer patients: By quitting, you reduce your chance of a second cancer.   Parents of children and adolescents: By quitting, you protect your children from illnesses caused by secondhand smoke.  QUESTIONS TO THINK  ABOUT Think about the following questions before you try to stop smoking. You may want to talk about your answers with your caregiver.  Why do you want to quit?   If you tried to quit in the past, what helped and what did not?   What will be the most difficult situations for you after you quit? How will you plan to handle them?   Who can help you through the tough times? Your family? Friends? Caregiver?   What pleasures do you get from smoking? What ways can you still get pleasure if you quit?  Here are some questions to ask your caregiver:  How can you help me to be successful at quitting?   What medicine do you think would be best for me and how should I take it?   What should I do if I need more help?   What is smoking withdrawal like? How can I get information on withdrawal?  Quitting takes hard work and a lot of effort, but you can quit smoking. FOR MORE INFORMATION  Smokefree.gov (http://www.davis-sullivan.com/) provides free, accurate, evidence-based information and professional assistance to help support the immediate and long-term needs of people trying to quit smoking. Document Released: 08/27/2001 Document Revised: 08/22/2011 Document Reviewed: 06/19/2009 [facility] Patient Information 2012 [place], [place].                                                   Cholesterol Control Diet  Cholesterol levels in your body are determined significantly by your diet. Cholesterol levels may also be related to heart disease. The following material helps to explain this relationship and discusses what you can do to help keep your heart healthy. Not all cholesterol is bad. Low-density lipoprotein (LDL) cholesterol is the "bad" cholesterol. It may cause fatty deposits to build up inside your arteries. High-density lipoprotein (HDL) cholesterol is "good." It helps to remove the "bad" LDL cholesterol from your blood. Cholesterol is a very important risk factor for heart disease. Other risk factors are  high blood pressure, smoking, stress, heredity, and weight. The heart muscle gets its supply of blood through the coronary arteries. If your LDL cholesterol is high and your HDL cholesterol is low, you are at risk for having fatty deposits build up in your coronary arteries. This leaves less room through which blood can flow. Without sufficient blood  and oxygen, the heart muscle cannot function properly and you may feel chest pains (angina pectoris). When a coronary artery closes up entirely, a part of the heart muscle may die, causing a heart attack (myocardial infarction). CHECKING CHOLESTEROL When your caregiver sends your blood to a lab to be analyzed for cholesterol, a complete lipid (fat) profile may be done. With this test, the total amount of cholesterol and levels of LDL and HDL are determined. Triglycerides are a type of fat that circulates in the blood and can also be used to determine heart disease risk. The list below describes what the numbers should be: Test: Total Cholesterol.  Less than 200 mg/dl.  Test: LDL "bad cholesterol."  Less than 100 mg/dl.   Less than 70 mg/dl if you are at very high risk of a heart attack or sudden cardiac death.  Test: HDL "good cholesterol."  Greater than 50 mg/dl for women.   Greater than 40 mg/dl for men.  Test: Triglycerides.  Less than 150 mg/dl.  CONTROLLING CHOLESTEROL WITH DIET Although exercise and lifestyle factors are important, your diet is key. That is because certain foods are known to raise cholesterol and others to lower it. The goal is to balance foods for their effect on cholesterol and more importantly, to replace saturated and trans fat with other types of fat, such as monounsaturated fat, polyunsaturated fat, and omega-3 fatty acids. On average, a person should consume no more than 15 to 17 g of saturated fat daily. Saturated and trans fats are considered "bad" fats, and they will raise LDL cholesterol. Saturated fats are  primarily found in animal products such as meats, butter, and cream. However, that does not mean you need to sacrifice all your favorite foods. Today, there are good tasting, low-fat, low-cholesterol substitutes for most of the things you like to eat. Choose low-fat or nonfat alternatives. Choose round or loin cuts of red meat, since these types of cuts are lowest in fat and cholesterol. Chicken (without the skin), fish, veal, and ground [place] breast are excellent choices. Eliminate fatty meats, such as hot dogs and salami. Even shellfish have little or no saturated fat. Have a 3 oz (85 g) portion when you eat lean meat, poultry, or fish. Trans fats are also called "partially hydrogenated oils." They are oils that have been scientifically manipulated so that they are solid at room temperature resulting in a longer shelf life and improved taste and texture of foods in which they are added. Trans fats are found in stick margarine, some tub margarines, cookies, crackers, and baked goods.  When baking and cooking, oils are an excellent substitute for butter. The monounsaturated oils are especially beneficial since it is believed they lower LDL and raise HDL. The oils you should avoid entirely are saturated tropical oils, such as coconut and palm.  Remember to eat liberally from food groups that are naturally free of saturated and trans fat, including fish, fruit, vegetables, beans, grains (barley, rice, couscous, bulgur wheat), and pasta (without cream sauces).  IDENTIFYING FOODS THAT LOWER CHOLESTEROL  Soluble fiber may lower your cholesterol. This type of fiber is found in fruits such as apples, vegetables such as broccoli, potatoes, and carrots, legumes such as beans, peas, and lentils, and grains such as barley. Foods fortified with plant sterols (phytosterol) may also lower cholesterol. You should eat at least 2 g per day of these foods for a cholesterol lowering effect.  Read package labels to identify  low-saturated fats, trans fats free,  and low-fat foods at the supermarket. Select cheeses that have only 2 to 3 g saturated fat per ounce. Use a heart-healthy tub margarine that is free of trans fats or partially hydrogenated oil. When buying baked goods (cookies, crackers), avoid partially hydrogenated oils. Breads and muffins should be made from whole grains (whole-wheat or whole oat flour, instead of "flour" or "enriched flour"). Buy non-creamy canned soups with reduced salt and no added fats.  FOOD PREPARATION TECHNIQUES  Never deep-fry. If you must fry, either stir-fry, which uses very little fat, or use non-stick cooking sprays. When possible, broil, bake, or roast meats, and steam vegetables. Instead of dressing vegetables with butter or margarine, use lemon and herbs, applesauce and cinnamon (for squash and sweet potatoes), nonfat yogurt, salsa, and low-fat dressings for salads.  LOW-SATURATED FAT / LOW-FAT FOOD SUBSTITUTES Meats / Saturated Fat (g)  Avoid: Steak, marbled (3 oz/85 g) / 11 g   Choose: Steak, lean (3 oz/85 g) / 4 g   Avoid: Hamburger (3 oz/85 g) / 7 g   Choose: Hamburger, lean (3 oz/85 g) / 5 g   Avoid: Ham (3 oz/85 g) / 6 g   Choose: Ham, lean cut (3 oz/85 g) / 2.4 g   Avoid: Chicken, with skin, dark meat (3 oz/85 g) / 4 g   Choose: Chicken, skin removed, dark meat (3 oz/85 g) / 2 g   Avoid: Chicken, with skin, light meat (3 oz/85 g) / 2.5 g   Choose: Chicken, skin removed, light meat (3 oz/85 g) / 1 g  Dairy / Saturated Fat (g)  Avoid: Whole milk (1 cup) / 5 g   Choose: Low-fat milk, 2% (1 cup) / 3 g   Choose: Low-fat milk, 1% (1 cup) / 1.5 g   Choose: Skim milk (1 cup) / 0.3 g   Avoid: Hard cheese (1 oz/28 g) / 6 g   Choose: Skim milk cheese (1 oz/28 g) / 2 to 3 g   Avoid: Cottage cheese, 4% fat (1 cup) / 6.5 g   Choose: Low-fat cottage cheese, 1% fat (1 cup) / 1.5 g   Avoid: Ice cream (1 cup) / 9 g   Choose: Sherbet (1 cup) / 2.5 g   Choose:  Nonfat frozen yogurt (1 cup) / 0.3 g   Choose: Frozen fruit bar / trace   Avoid: Whipped cream (1 tbs) / 3.5 g   Choose: Nondairy whipped topping (1 tbs) / 1 g  Condiments / Saturated Fat (g)  Avoid: Mayonnaise (1 tbs) / 2 g   Choose: Low-fat mayonnaise (1 tbs) / 1 g   Avoid: Butter (1 tbs) / 7 g   Choose: Extra light margarine (1 tbs) / 1 g   Avoid: Coconut oil (1 tbs) / 11.8 g   Choose: Olive oil (1 tbs) / 1.8 g   Choose: Corn oil (1 tbs) / 1.7 g   Choose: Safflower oil (1 tbs) / 1.2 g   Choose: Sunflower oil (1 tbs) / 1.4 g   Choose: Soybean oil (1 tbs) / 2.4 g   Choose: Canola oil (1 tbs) / 1 g  Document Released: 09/02/2005 Document Revised: 05/15/2011 Document Reviewed: 02/21/2011 [facility] Patient Information 2012 [place], [place].  Hypertension As your heart beats, it forces blood through your arteries. This force is your blood pressure. If the pressure is too high, it is called hypertension (HTN) or high blood pressure. HTN is dangerous because you may have it and not know it.  High blood pressure may mean that your heart has to work harder to pump blood. Your arteries may be narrow or stiff. The extra work puts you at risk for heart disease, stroke, and other problems.  Blood pressure consists of two numbers, a higher number over a lower, 110/72, for example. It is stated as "110 over 72." The ideal is below 120 for the top number (systolic) and under 80 for the bottom (diastolic). Write down your blood pressure today. You should pay close attention to your blood pressure if you have certain conditions such as:  Heart failure.   Prior heart attack.   Diabetes   Chronic kidney disease.   Prior stroke.   Multiple risk factors for heart disease.  To see if you have HTN, your blood pressure should be measured while you are seated with your arm held at the level of the heart. It should be measured at least twice. A one-time elevated blood pressure reading  (especially in the Emergency Department) does not mean that you need treatment. There may be conditions in which the blood pressure is different between your right and left arms. It is important to see your caregiver soon for a recheck. Most people have essential hypertension which means that there is not a specific cause. This type of high blood pressure may be lowered by changing lifestyle factors such as:  Stress.   Smoking.   Lack of exercise.   Excessive weight.   Drug/tobacco/alcohol use.   Eating less salt.  Most people do not have symptoms from high blood pressure until it has caused damage to the body. Effective treatment can often prevent, delay or reduce that damage. TREATMENT  When a cause has been identified, treatment for high blood pressure is directed at the cause. There are a large number of medications to treat HTN. These fall into several categories, and your caregiver will help you select the medicines that are best for you. Medications may have side effects. You should review side effects with your caregiver. If your blood pressure stays high after you have made lifestyle changes or started on medicines,   Your medication(s) may need to be changed.   Other problems may need to be addressed.   Be certain you understand your prescriptions, and know how and when to take your medicine.   Be sure to follow up with your caregiver within the time frame advised (usually within two weeks) to have your blood pressure rechecked and to review your medications.   If you are taking more than one medicine to lower your blood pressure, make sure you know how and at what times they should be taken. Taking two medicines at the same time can result in blood pressure that is too low.  SEEK IMMEDIATE MEDICAL CARE IF:  You develop a severe headache, blurred or changing vision, or confusion.   You have unusual weakness or numbness, or a faint feeling.   You have severe chest or  abdominal pain, vomiting, or breathing problems.  MAKE SURE YOU:   Understand these instructions.   Will watch your condition.   Will get help right away if you are not doing well or get worse.  Document Released: 09/02/2005 Document Revised: 08/22/2011 Document Reviewed: 04/22/2008 [facility] Patient Information 2012 [place], [place].

## 2012-06-12 ENCOUNTER — Encounter: Payer: Self-pay | Admitting: Gynecology

## 2012-06-15 ENCOUNTER — Telehealth: Payer: Self-pay | Admitting: Pulmonary Disease

## 2012-06-15 NOTE — Telephone Encounter (Signed)
Called and spoke with patient. Patient stating she recently had OB/GYN OV and MD there requesting advise/reccomendation from Dr. Kriste Basque on patient receiving tdap vaccine from his (OB/GYN) office.  Dr. Kriste Basque please advise. Thank You

## 2012-06-16 NOTE — Telephone Encounter (Signed)
Called and lmomtcb for the pt----per SN---ok to get the tdap.

## 2012-06-16 NOTE — Telephone Encounter (Signed)
Was sent directly to SN.  Will resend to [facility].

## 2012-06-17 ENCOUNTER — Telehealth: Payer: Self-pay | Admitting: *Deleted

## 2012-06-17 ENCOUNTER — Ambulatory Visit: Payer: 59 | Admitting: Pulmonary Disease

## 2012-06-17 NOTE — Telephone Encounter (Signed)
Pt was called to inform her of the urodynamic procedure benefits and schedule the appt. Her insurance covers it with a $30 copay. I will await a call  Back from the patient. KW

## 2012-06-18 NOTE — Telephone Encounter (Signed)
lmtcb

## 2012-06-19 ENCOUNTER — Ambulatory Visit (INDEPENDENT_AMBULATORY_CARE_PROVIDER_SITE_OTHER): Payer: 59 | Admitting: Gynecology

## 2012-06-19 ENCOUNTER — Encounter: Payer: Self-pay | Admitting: Gynecology

## 2012-06-19 ENCOUNTER — Ambulatory Visit (INDEPENDENT_AMBULATORY_CARE_PROVIDER_SITE_OTHER): Payer: 59

## 2012-06-19 VITALS — BP 128/82

## 2012-06-19 DIAGNOSIS — D259 Leiomyoma of uterus, unspecified: Secondary | ICD-10-CM

## 2012-06-19 DIAGNOSIS — R102 Pelvic and perineal pain unspecified side: Secondary | ICD-10-CM | POA: Insufficient documentation

## 2012-06-19 DIAGNOSIS — N83202 Unspecified ovarian cyst, left side: Secondary | ICD-10-CM

## 2012-06-19 DIAGNOSIS — N83209 Unspecified ovarian cyst, unspecified side: Secondary | ICD-10-CM

## 2012-06-19 DIAGNOSIS — N946 Dysmenorrhea, unspecified: Secondary | ICD-10-CM

## 2012-06-19 DIAGNOSIS — N949 Unspecified condition associated with female genital organs and menstrual cycle: Secondary | ICD-10-CM

## 2012-06-19 DIAGNOSIS — N920 Excessive and frequent menstruation with regular cycle: Secondary | ICD-10-CM

## 2012-06-19 MED ORDER — TRANEXAMIC ACID 650 MG PO TABS: ORAL_TABLET | ORAL | Status: DC

## 2012-06-19 NOTE — Progress Notes (Signed)
46 y.o who was seen on September 26. for annual gyn exam who presented as well complaining of low pelvic pain and left lower quadrant discomfort on and off for the past 5 months and stating that her periods are getting heavier. Review of her record indicated in July 2012 patient had an ultrasound which demonstrated she had a left ovarian cyst which measured 3.8 x 3.6 x 3.0 cm which is avascular and she was instructed to return back for followup ultrasound in 3 months and she did not. She states her cycles are regular there heavy lasting 5-7 days with cramps and has been having left lower quadrant discomfort. Patient also states that she suffers from dyspareunia and feels bloated right before her menses. She presented today for followup ultrasound.  Ultrasound report: Uterus measures 9.3 x 5.7 x 4.8 cm with endometrial stripe of 8.7 mm. A posterior fibroid measuring 12 x 8 mm was noted. A possible fibroid was seen in the endometrial cavity measured 9 x 6 x 8 mm ovaries otherwise appeared to be normal. No free fluid in the cul-de-sac.  Assessment/plan: Patient with history of dysmenorrhea, menorrhagia, and dyspareunia, bloating before her menses who wants to proceed with definitive surgery such as a hysterectomy. We had offered her for consideration of a Mirena IUD or hormonal manipulation and she rejected both. She would like to wait for some time in January to schedule her hysterectomy. I've given her literature information on total laparoscopic hysterectomy as well as on fibroids. She will be prescribed for Lysteda 650 mg 2 tablets 3 times a day for 5 days during her menses. Patient does have a smoking history and has had a previous tubal sterilization procedure. She did not follow up with her internist. Last office visit we had started her on HCTZ 12.5 mg daily for hypertension. Her blood pressure today was 128/82. She will need medical clearance by her internist before her surgery.

## 2012-06-19 NOTE — Patient Instructions (Addendum)
Total Laparoscopic Hysterectomy A total laparoscopic hysterectomy is a minimally invasive surgery to remove your uterus and cervix. This surgery is performed by making several small cuts (incisions) in your abdomen. It can also be done with a thin, lighted tube (laparoscope) inserted into 2 small incisions in the lower abdomen. Your fallopian tubes and ovaries can be removed (bilateral salpingo-oopherectomy) during this surgery as well.If a total laparoscopic hysterectomy is started and it is not safe to continue, the laparoscopic surgery will be converted to an open abdominal surgery. You will not have menstrual periods or be able to get pregnant after having this surgery. If a bilateral salpingo-oopherectomy was performed before menopause, you will go through a sudden (abrupt) menopause. This can be helped with hormone medicines. Benefits of minimally invasive surgery include:  Less pain.  Less risk of blood loss.  Less risk of infection.  Quicker return to normal activities.  Usually a 1 night stay in the hospital.  Overall patient satisfaction. LET YOUR CAREGIVER KNOW ABOUT:  Any history of abnormal Pap tests.  Allergies to food or medicine.  Medicines taken, including vitamins, herbs, eyedrops, over-the-counter medicines, and creams.  Use of steroids (by mouth or creams).  Previous problems with anesthetics or numbing medicines.  History of bleeding problems or blood clots.  Previous surgery.  Other health problems, including diabetes and kidney problems.  Desire for future fertility.  Any infections or colds you may have developed.  Symptoms of irregular or heavy periods, weight loss, or urinary or bowel changes. RISKS AND COMPLICATIONS   Bleeding.  Blood clots in the legs or lung.  Infection.  Injury to surrounding organs.  Problems with anesthesia.  Early menopause symptoms (hot flashes, night sweats, insomnia).  Risk of conversion to an open abdominal  incision. BEFORE THE PROCEDURE  Ask your caregiver about changing or stopping your regular medicines.  Do not take aspirin or blood thinners (anticoagulants) for 1 week before the surgery, or as told by your caregiver.  Do not eat or drink anything for 8 hours before the surgery, or as told by your caregiver.  Quit smoking if you smoke.  Arrange for a ride home after surgery and for someone to help you at home during recovery. PROCEDURE   You will be given antibiotic medicine.  An intravenous (IV) line will be placed in your arm. You will be given medicine to make you sleep (general anesthetic).  A gas (carbon dioxide) will be used to inflate your abdomen. This will allow your surgeon to look inside your abdomen, perform your surgery, and treat any other problems found if necessary.  Three or four small incisions (often less than  inch) will be made in your abdomen. One of these incisions will be made in the area of your belly button (navel). The laparoscope will be inserted into the incision. Your surgeon will look through the laparoscope while doing your procedure.  Other surgical instruments will be inserted through the other incisions.  The uterus may be removed through the vagina or cut into small pieces and removed through the small incisions.  Your incisions will be closed. AFTER THE PROCEDURE  The gas will be released from inside your abdomen.  You will be taken to the recovery area where a nurse will watch and check your progress. Once you are awake, stable, and taking fluids well, without other problems, you will return to your room or be allowed to go home.  There is usually minimal discomfort following the surgery because  the incisions are so small.  You will be given pain medicine while you are in the hospital and for when you go home.  Try to have someone with you the first 3 to 5 days after you go home.  Follow up with your surgeon in 2 to 4 weeks after surgery  to evaluate your progress. Document Released: 06/30/2007 Document Revised: 11/25/2011 Document Reviewed: 04/19/2011 [facility] Patient Information 2013 [place], [place].  Endometriosis Endometriosis is a disease that occurs when the endometrium (lining of the uterus) is misplaced outside of its normal location. It may occur in many locations close to the uterus (womb), but commonly on the ovaries, fallopian tubes, vagina (birth canal) and bowel located close to the uterus. Because the uterus sloughs (expels) its lining every month (menses), there is bleeding whereever the endometrial tissue is located. SYMPTOMS  Often there are no symptoms. However, because blood is irritating to tissues not normally exposed to it, when symptoms occur they vary with the location of the misplaced endometrium. Symptoms often include back and abdominal pain. Periods may be heavier and intercourse may be painful. Infertility may be present. You may have all of these symptoms at one time or another or you may have months with no symptoms at all. Although the symptoms occur mainly during menses, they can occur mid-cycle as well, and usually terminate with menopause. DIAGNOSIS  Your caregiver may recommend a blood test and urine test (urinalysis) to help rule out other conditions. Another common test is ultrasound, a painless procedure that uses sound waves to make a sonogram "picture" of abnormal tissue that could be endometriosis. If your bowel movements are painful around your periods, your caregiver may advise a barium enema (an X-ray of the lower bowel), to try to find the source of your pain. This is sometimes confirmed by laparoscopy. Laparoscopy is a procedure where your caregiver looks into your abdomen with a laparoscope (a small pencil sized telescope). Your caregiver may take a tiny piece of tissue (biopsy) from any abnormal tissue to confirm or document your problem. These tissues are sent to the lab and a pathologist  looks at them under the microscope to give a microscopic diagnosis. TREATMENT  Once the diagnosis is made, it can be treated by destruction of the misplaced endometrial tissue using heat (diathermy), laser, cutting (excision), or chemical means. It may also be treated with hormonal therapy. When using hormonal therapy menses are eliminated, therefore eliminating the monthly exposure to blood by the misplaced endometrial tissue. Only in severe cases is it necessary to perform a hysterectomy with removal of the tubes, uterus and ovaries. HOME CARE INSTRUCTIONS   Only take over-the-counter or prescription medicines for pain, discomfort, or fever as directed by your caregiver.  Avoid activities that produce pain, including a physical sexual relationship.  Do not take aspirin as this may increase bleeding when not on hormonal therapy.  See your caregiver for pain or problems not controlled with treatment. SEEK IMMEDIATE MEDICAL CARE IF:   Your pain is severe and is not responding to pain medicine.  You develop severe nausea and vomiting, or you cannot keep foods down.  Your pain localizes to the right lower part of your abdomen (possible appendicitis).  You have swelling or increasing pain in the abdomen.  You have a fever.  You see blood in your stool. MAKE SURE YOU:   Understand these instructions.  Will watch your condition.  Will get help right away if you are not doing well or  get worse. Document Released: 08/30/2000 Document Revised: 11/25/2011 Document Reviewed: 04/20/2008 [facility] Patient Information 2013 [place], [place].  Uterine Fibroid A uterine fibroid is a growth (tumor) that occurs in a woman's uterus. This type of tumor is not cancerous and does not spread out of the uterus. A woman can have one or many fibroids, and the fiboid(s) can become quite large. A fibroid can vary in size, weight, and where it grows in the uterus. Most fibroids do not require medical treatment,  but some can cause pain or heavy bleeding during and between periods. CAUSES  A fibroid is the result of a single uterine cell that keeps growing (unregulated), which is different than most cells in the human body. Most cells have a control mechanism that keeps them from reproducing without control.  SYMPTOMS   Bleeding.  Pelvic pain and pressure.  Bladder problems due to the size of the fibroid.  Infertility and miscarriages depending on the size and location of the fibroid. DIAGNOSIS  A diagnosis is made by physical exam. Your caregiver may feel the lumpy tumors during a pelvic exam. Important information regarding size, location, and number of tumors can be gained by having an ultrasound. It is rare that other tests, such as a CT scan or MRI, are needed. TREATMENT   Your caregiver may recommend watchful waiting. This involves getting the fibroid checked by your caregiver to see if the fibroids grow or shrink.   Hormonal treatment or an intrauterine device (IUD) may be prescribed.   Surgery may be needed to remove the fibroids (myomectomy) or the uterus (hysterectomy). This depends on your situation. When fibroids interfere with fertility and a woman wants to become pregnant, a caregiver may recommend having the fibroids removed.  HOME CARE INSTRUCTIONS  Home care depends on how you were treated. In general:   Keep all follow-up appointments with your caregiver.   Only take medicine as told by your caregiver. Do not take aspirin. It can cause bleeding.   If you have excessive periods and soak tampons or pads in a half hour or less, contact your caregiver immediately. If your periods are troublesome but not so heavy, lie down with your feet raised slightly above your heart. Place cold packs on your lower abdomen.   If your periods are heavy, write down the number of pads or tampons you use per month. Bring this information to your caregiver.   Talk to your caregiver about  taking iron pills.   Include green vegetables in your diet.   If you were prescribed a hormonal treatment, take the hormonal medicines as directed.   If you need surgery, ask your caregiver for information on your specific surgery.  SEEK IMMEDIATE MEDICAL CARE IF:  You have pelvic pain or cramps not controlled with medicines.   You have a sudden increase in pelvic pain.   You have an increase of bleeding between and during periods.   You feel lightheaded or have fainting episodes.  MAKE SURE YOU:  Understand these instructions.  Will watch your condition.  Will get help right away if you are not doing well or get worse. Document Released: 08/30/2000 Document Revised: 11/25/2011 Document Reviewed: 09/23/2011 [facility] Patient Information 2013 [place], [place].

## 2012-06-22 ENCOUNTER — Telehealth: Payer: Self-pay | Admitting: Gynecology

## 2012-06-22 NOTE — Telephone Encounter (Signed)
Patient called and left voice mail saying she wanted to make appointment to talk with me about scheduling surgery.  I called her back and told her I reviewed her last office note and it mentioned she would like to schedule hysterectomy in January.  I told her if that was still the case that I did nto have the January book at this time but should get it in the next several weeks and that I will call her as soon as I do.  I told her if she had decided she wanted it sooner than Jan just to call back and let me know.  Otherwise, I will call her as soon as I get January schedule.

## 2012-06-22 NOTE — Telephone Encounter (Signed)
lmomtcb x3 on cell and home # is the same #

## 2012-06-23 NOTE — Telephone Encounter (Signed)
Pt advised. Terrin Imparato, CMA  

## 2012-07-01 ENCOUNTER — Telehealth: Payer: Self-pay | Admitting: Gynecology

## 2012-07-01 NOTE — Telephone Encounter (Signed)
Okay if this will suffice thank you

## 2012-07-01 NOTE — Telephone Encounter (Signed)
I called patient to inform her that Dr. Glenetta Hew wanted her to see primary care MD for medical clearance for surgery.  Patient said Dr. Glenetta Hew had mentioned this to her at office visit and she has appt on Jul 16, 2012 with primary care MD.  She will have him write note or put a note in her chart in the [facility] system so we can know he has cleared her.

## 2012-07-15 ENCOUNTER — Encounter: Payer: Self-pay | Admitting: *Deleted

## 2012-07-16 ENCOUNTER — Other Ambulatory Visit (INDEPENDENT_AMBULATORY_CARE_PROVIDER_SITE_OTHER): Payer: 59

## 2012-07-16 ENCOUNTER — Encounter: Payer: Self-pay | Admitting: Pulmonary Disease

## 2012-07-16 ENCOUNTER — Other Ambulatory Visit: Payer: Self-pay | Admitting: Gynecology

## 2012-07-16 ENCOUNTER — Ambulatory Visit (INDEPENDENT_AMBULATORY_CARE_PROVIDER_SITE_OTHER): Payer: 59 | Admitting: Pulmonary Disease

## 2012-07-16 VITALS — BP 126/82 | HR 80 | Temp 98.3°F | Ht 62.0 in | Wt 244.2 lb

## 2012-07-16 DIAGNOSIS — E669 Obesity, unspecified: Secondary | ICD-10-CM

## 2012-07-16 DIAGNOSIS — E7800 Pure hypercholesterolemia, unspecified: Secondary | ICD-10-CM

## 2012-07-16 DIAGNOSIS — R300 Dysuria: Secondary | ICD-10-CM

## 2012-07-16 DIAGNOSIS — N949 Unspecified condition associated with female genital organs and menstrual cycle: Secondary | ICD-10-CM

## 2012-07-16 DIAGNOSIS — Z23 Encounter for immunization: Secondary | ICD-10-CM

## 2012-07-16 DIAGNOSIS — F411 Generalized anxiety disorder: Secondary | ICD-10-CM

## 2012-07-16 DIAGNOSIS — D259 Leiomyoma of uterus, unspecified: Secondary | ICD-10-CM

## 2012-07-16 DIAGNOSIS — I10 Essential (primary) hypertension: Secondary | ICD-10-CM

## 2012-07-16 DIAGNOSIS — N920 Excessive and frequent menstruation with regular cycle: Secondary | ICD-10-CM

## 2012-07-16 DIAGNOSIS — M1990 Unspecified osteoarthritis, unspecified site: Secondary | ICD-10-CM

## 2012-07-16 LAB — LIPID PANEL
Cholesterol: 221 mg/dL — ABNORMAL HIGH (ref 0–200)
VLDL: 12.8 mg/dL (ref 0.0–40.0)

## 2012-07-16 LAB — URINALYSIS, ROUTINE W REFLEX MICROSCOPIC
Ketones, ur: 15
Specific Gravity, Urine: 1.02 (ref 1.000–1.030)
Total Protein, Urine: NEGATIVE
Urine Glucose: NEGATIVE
pH: 6 (ref 5.0–8.0)

## 2012-07-16 LAB — LDL CHOLESTEROL, DIRECT: Direct LDL: 162.1 mg/dL

## 2012-07-16 MED ORDER — CIPROFLOXACIN HCL 250 MG PO TABS: 250.0000 mg | ORAL_TABLET | Freq: Two times a day (BID) | ORAL | Status: DC

## 2012-07-16 NOTE — Patient Instructions (Addendum)
Today we updated your med list in our EPIC system...    Continue your current medications the same...  Today we rechecked your fasting lipid profile & we will call you w/ the results...  We also checked a urinalysis & culture- and decided to treat you w/ CIPRO 250mg  twice daily til gone...  Call for any questions...  Good luck w/ the up-coming surgery in Dec..[name]

## 2012-07-16 NOTE — Progress Notes (Addendum)
Subjective:    Patient ID: Dana [name], female    DOB: August 01, 1966, 46 y.o.   MRN: [id number]  HPI 46 y/o BF here for a follow up visit...  ~  January 12, 2010:  Yearly ROV & CPX> recent stress w/ f/u mammogram showing enlarging lesion right breast... had sm nodule (~51mm) at 6 o'clock position right breast 4/10 w/ core biopsy = fibroadenoma... f/u mammogram 4/11 w/ 1.6 x 1cm lesion w/ lobulation... she is referred to [facility] & awaiting consult for excision 5/2 DrRosenbower... she has lost 20# this yr on diet & walking... BP still borderline controlled on diet alone (she refused meds) w/ BP 130/90 today... she was started on Prav40 for her Chol last yr but she stopped it on her own 2 mo ago... still smoking several cigs per day & not motivated to quit.  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  ~  March 17, 2012:  72mo ROV & CPX> Miray stopped her Prav40 last yr due to upset stomach (sounded more like IBS) & didn't call to notify us or deal w/ the side effect, she has been off it for many months & her FLP is reranged at baseline w/ LDL=154; we discussed options & she agreed to try LOW DOSE Crestor5mg /d...  She wants refills of her Xanax & Vicodin please...    We reviewed prob list, meds, xrays and labs> see below for updates>> CXR 7/13 showed heart at upper lim norm, lungs clear, DJD in spine, NAD.Marland KitchenMarland Kitchen EKG 7/13 showed NSR, rate81,  WNL, NAD... LABS 7/13:  FLP- not at goal & Cres5 started;  Chems- wnl;  CBC- wnl;  TSH=1.30  ~  July 16, 2012:  28mo ROV & Dana [name] has a hysterectomy sched for 09/03/12 by DrFernandez> Epic records reveals dysmenorrhea & menorrhagia, fibroids & left ovarian mass seen on prev ultrasound;  Her BP was noted to be in the 140/90 range at DrFernandez office & he started her on HCTZ 12.5mg  Sep2013;  Today she is c/o dysuria- pain, pressure x2-3d & we checked UA, C&S, started empiric Rx w/ Cipro250Bid...    Smoker> she has decreased smoking to 0-1cig/d "when I'm nervous" but doesn't buy them anymore; last CXR 7/13 was clear; she is encouraged to quit completely...    HBP> on HCT 12.5mg /d now + low sodium diet, weight control, etc; BP= 126/82 today & we reviewed the assoc betw her weight & BP- encouraged taking med everyday, + diet & exercise...    Chol> on Cres5 started 7/13; f/u FLP showed TChol 221, TG 64, HDL 55, LDL 162; rec to incr Cres10...    Obesity> weight=244# (up 2#) & BMI=44; we reviewed diet, exercise, & the option for Bariatic surg...    GI eval 8/13 by DrGessner w/ EGD was normal, Colonoscopy was also WNL- no lesion ident &  random Bxs= benign colonic mucosa;  She had been c/o n/v, abd pain & diarrhea- frequently post prandial; tried Bentyl20 prn w/o help, changed to Zofran & Glycopyrrolate 2mg Bid...    GU> r/o UTI w/ her acute symtoms today; UA showed few wbc & bact- We decided to treat w/ Cipro250Bid pending culture data...    GYN> per DrFernandez & notes reviewed...    DJD/ LBP> on Mobic7.5 & Vicodin prn; she has seen DrDean & DrNitka in the past w/ L2-3 microdiskectomy 4/09...    Anxiety> on Alprazolam 0.5mg  prn We reviewed prob list, meds, xrays and labs> see below for updates >>   Addendum>> Urine cult ret neg, no growth...         Problem List:  CIGARETTE SMOKER (ICD-305.1) - still smokes several cigarettes per day (~1/4ppd she says)... we discussed smoking cessation strategies and  offered counselling, patches, Chantix, etc... she declines smoking cessation help & does not appear motivated to quit... She denies cough, sputum, hemoptysis, SOB, etc... ~  CXR 4/12 showed clear lungs, WNL.[name]. ~  7/13:  We reviewed smoking cessation strategies; CXR showed heart at upper lim norm, clear lungs, DJD spine... ~  10/13:  P[t indicates that she has decr to 0-1 cig/d & doesn't buy them anymore...  HYPERTENSION, MILD (ICD-401.1) - borderline HBP- ?related to her weight... we prev prescribed LisinoprilL/Hct Aug08 but she didn't follow up and stopped this on her own... she prefers to treat this without meds & we discussed diet + exercise needed...   ~  4/11:  BP=130/90> denies HA, fatigue, visual changes, CP, palipit, dizziness, syncope, dyspnea, edema, etc; pt advised to get a BP cuff and start monitoring BP at home, get on diet- 2 gm sodium, 1200 cal/d weight reducing diet, and incease exercise program... ~  4/12:  BP still ~128/82 despite 12# wt gain over the last yr... she declines medRx. ~  7/13:  BP= 136/74 & she is reminded to get wt down, avoid salt, etc... ~  9/13:  BP was ~140/90 at DrFernandez office & he started HCT 12.5mg /d... ~  10/13:  BP= 126/82 & she remains asymptomatic w/o HA, visual sx, CP, palpit, SOB, edema...  HYPERCHOLESTEROLEMIA (ICD-272.0) - she refused to fill Simva Rx in past & was rec to take Prav40 but she has refused all meds for Chol until 7/13=> started CRESTOR5. ~  FLP 8/08 showed TChol 244, TG 56, HDL 66, LDL 168- Simvastatin recommended, but pt refused. ~  FLP 3/10 showed TChol 247, TG 52, HDL 71, LDL 166... rec> try Pravastatin40 + diet & exercise. ~  FLP 4/11 off Prav40 x62mo showed TChol 240, TG 50, HDL 76, LDL158... rec> get bck on Prav40 (she never did) ~  FLP 4/12 on diet alone showed TChol 233, TG 30, HDL 72, LDL 154... She is rec to take the statin, but she prefers diet alone... ~  FLP 7/13 on "diet" showed TChol 252, TG 80, HDL 85, LDL 154.[name] She is  rec to start CRESTOR 5mg /d... ~  FLP 7/13 on Cres5 showed TChol 221, TG 64, HDL 55, LDL 162... Rec to incr CRESTOR 10mg /d & take it daily.  OBESITY (ICD-278.00) - diet/ exercise discussed w/ pt (again)... she refuses to consider bariatric approach... in her 5\' 4"  frame her BMI has been >40.[name]. ~  weight 2/07 = 228# ~  weight 8/08 = 237# ~  weight 3/09 = 243# ~  weight 3/10 = 240# ~  weight 4/11 = 220# ~  weight 4/12 =  232# ~  Weight 7/13 = 242# ~  Weight 10/13 = 244#  BREAST MASS, RIGHT (ICD-611.72) - benign fibroadenoma removed by DrRosenbower 6/11... ~  4/12: she had a f/u mammogram at [facility]= neg... ~  4/13:  F/u mammogram was neg...  DEGENERATIVE JOINT DISEASE (ICD-715.90) BACK PAIN WITH RADICULOPATHY (ICD-729.2) - presented 3/09 w/ left sided back pain into left leg... eval by DrDean & DrNitka w/  L2-3 microdiscectomy 4/09 by Dana [name]... ~  Recurrent LBP treated w/ rest, ice/heat, Advil, VICODIN Tid prn...  Hx of HEADACHE (ICD-784.0)  ANXIETY (ICD-300.00) - on ALPRAZOLAM 0.5mg  Tid Prn...   Past Surgical History  Procedure Date  . Tubal ligation   . Lumbar disc surgery   . Finger surgery     Right index-excision of mass   . Breast lumpectomy   . Foot surgery     Outpatient Encounter Prescriptions as of 07/16/2012  Medication Sig Dispense Refill  . ALPRAZolam (XANAX) 0.5 MG tablet 1/2 to 1 tablet three times daily as needed for nerves  90 tablet  5  . glycopyrrolate (ROBINUL) 2 MG tablet Take 1 tablet (2 mg total) by mouth 2 (two) times daily.  60 tablet  0  . hydrochlorothiazide (MICROZIDE) 12.5 MG capsule Take 1 capsule (12.5 mg total) by mouth daily.  30 capsule  11  . HYDROcodone-acetaminophen (VICODIN) 5-500 MG per tablet Take 1 tablet by mouth every 6 (six) hours as needed for pain.  20 tablet  0  . meloxicam (MOBIC) 7.5 MG tablet       . ondansetron (ZOFRAN) 4 MG tablet       . rosuvastatin (CRESTOR) 5 MG tablet Take 1 tablet (5 mg total) by mouth daily.  30 tablet   11  . tranexamic acid (LYSTEDA) 650 MG TABS Take two tablets three times a day with the start of menses for 5 days  30 tablet  12  . ciprofloxacin (CIPRO) 250 MG tablet Take 1 tablet (250 mg total) by mouth 2 (two) times daily.  14 tablet  0    Allergies  Allergen Reactions  . Penicillins     REACTION: itching and swelling    Review of Systems        The patient complains of anxiety.  The patient denies fever, chills, sweats, anorexia, fatigue, weakness, malaise, weight loss, sleep disorder, blurring, diplopia, eye irritation, eye discharge, vision loss, eye pain, photophobia, earache, ear discharge, tinnitus, decreased hearing, nasal congestion, nosebleeds, sore throat, hoarseness, chest pain, palpitations, syncope, dyspnea on exertion, orthopnea, PND, peripheral edema, cough, dyspnea at rest, excessive sputum, hemoptysis, wheezing, pleurisy, nausea, vomiting, diarrhea, constipation, change in bowel habits, abdominal pain, melena, hematochezia, jaundice, gas/bloating, indigestion/heartburn, dysphagia, odynophagia, dysuria, hematuria, urinary frequency, urinary hesitancy, nocturia, incontinence, back pain, joint pain, joint swelling, muscle cramps, muscle weakness, stiffness, arthritis, sciatica, restless legs, leg pain at night, leg pain with exertion, rash, itching, dryness, suspicious lesions, paralysis, paresthesias, seizures, tremors, vertigo, transient blindness, frequent falls, frequent headaches, difficulty walking, depression, memory loss, confusion, cold intolerance, heat intolerance, polydipsia, polyphagia, polyuria, unusual weight change, abnormal bruising, bleeding, enlarged lymph nodes, urticaria, allergic rash, hay fever, and recurrent infections.     Objective:   Physical Exam     WD, Obese, 45 y/o BF in NAD... GENERAL:  Alert & oriented; pleasant & cooperative... HEENT:  Box/AT, EOM-wnl, PERRLA, EACs-clear, TMs-wnl, NOSE-clear, THROAT-clear & wnl. NECK:  Supple w/ fairROM; no  JVD; normal carotid impulses w/o bruits; no thyromegaly or nodules palpated; no lymphadenopathy. CHEST:  Clear to P & A; without wheezes/ rales/ or rhonchi. HEART:  Regular Rhythm; without murmurs/ rubs/ or gallops. ABDOMEN:  Obese, soft & nontender x min tender over bladder; normal bowel sounds; no organomegaly or masses detected. EXT: without deformities, mild arthritic changes; no varicose veins/ +venous insuffic/ no edema. NEURO:  CN's intact;  no focal neuro deficits... DERM:  No lesions noted; no rash etc...  RADIOLOGY DATA:  Reviewed in the EPIC EMR & discussed w/ the patient...  LABORATORY DATA:  Reviewed in the EPIC EMR & discussed w/ the patient...   Assessment & Plan:    UTI>  She presents w/ dysuria & pressure over bladder; UA and C&S obtained; we decided to treat w/ Cipro250Bid pending culture==> cult ret neg, no growth.  HBP>  Her BP was elev at DrFernandez office 9/13 ~140/90 & he started HCT 12.5mg /d;  BP improved now & tol Rx well- continue same; we again reviewed the critically important wt loss/ low sodium/ etc...  CHOL>  Now on Cres5 daily & tol well she says; FLP showed parameters not at goals- decided to incr Cres10 & take it every day!  Obesity>  Ditto, everything is dependent on her losing weight but she has been ineffective w/ diet, e3xercise, etc; she has so far refused to consider bariatric surg...  DJD>  She will f/u w/ DrDean & Dana [name], her Orthopedists... She wants Vicodin refilled...  Anxiety>  On Alprazolam prn...   Patient's Medications  New Prescriptions   CIPROFLOXACIN (CIPRO) 250 MG TABLET    Take 1 tablet (250 mg total) by mouth 2 (two) times daily.  Previous Medications   ALPRAZOLAM (XANAX) 0.5 MG TABLET    1/2 to 1 tablet three times daily as needed for nerves   GLYCOPYRROLATE (ROBINUL) 2 MG TABLET    Take 1 tablet (2 mg total) by mouth 2 (two) times daily.   HYDROCHLOROTHIAZIDE (MICROZIDE) 12.5 MG CAPSULE    Take 1 capsule (12.5 mg total) by  mouth daily.   HYDROCODONE-ACETAMINOPHEN (VICODIN) 5-500 MG PER TABLET    Take 1 tablet by mouth every 6 (six) hours as needed for pain.   MELOXICAM (MOBIC) 7.5 MG TABLET       ONDANSETRON (ZOFRAN) 4 MG TABLET       ROSUVASTATIN (CRESTOR) 5 MG TABLET    Take 1 tablet (5 mg total) by mouth daily.   TRANEXAMIC ACID (LYSTEDA) 650 MG TABS    Take two tablets three times a day with the start of menses for 5 days  Modified Medications   No medications on file  Discontinued Medications   No medications on file

## 2012-07-17 ENCOUNTER — Ambulatory Visit (INDEPENDENT_AMBULATORY_CARE_PROVIDER_SITE_OTHER): Payer: 59 | Admitting: Gynecology

## 2012-07-17 ENCOUNTER — Other Ambulatory Visit: Payer: Self-pay | Admitting: Pulmonary Disease

## 2012-07-17 ENCOUNTER — Ambulatory Visit (INDEPENDENT_AMBULATORY_CARE_PROVIDER_SITE_OTHER): Payer: 59

## 2012-07-17 DIAGNOSIS — IMO0002 Reserved for concepts with insufficient information to code with codable children: Secondary | ICD-10-CM | POA: Insufficient documentation

## 2012-07-17 DIAGNOSIS — R102 Pelvic and perineal pain: Secondary | ICD-10-CM

## 2012-07-17 DIAGNOSIS — N949 Unspecified condition associated with female genital organs and menstrual cycle: Secondary | ICD-10-CM

## 2012-07-17 DIAGNOSIS — N920 Excessive and frequent menstruation with regular cycle: Secondary | ICD-10-CM

## 2012-07-17 DIAGNOSIS — N946 Dysmenorrhea, unspecified: Secondary | ICD-10-CM

## 2012-07-17 DIAGNOSIS — N840 Polyp of corpus uteri: Secondary | ICD-10-CM

## 2012-07-17 MED ORDER — ROSUVASTATIN CALCIUM 10 MG PO TABS: 10.0000 mg | ORAL_TABLET | Freq: Every day | ORAL | Status: DC

## 2012-07-17 NOTE — Progress Notes (Signed)
Patient is a 46 year old was seen in the office on October 4 and prior to that September 26 see previous notes.. Patient with history of severe dysmenorrhea menorrhagia along with dyspareunia and bloating. She had an ultrasound October 4 which demonstrated the following:  Ultrasound report: Uterus measures 9.3 x 5.7 x 4.8 cm with endometrial stripe of 8.7 mm. A posterior fibroid measuring 12 x 8 mm was noted. A possible fibroid was seen in the endometrial cavity measured 9 x 6 x 8 mm ovaries otherwise appeared to be normal. No free fluid in the cul-de-sac.   She presented to the office today for sonohysterogram and endometrial biopsy. Sonohysterogram demonstrated an anterior polyp measuring 12 x 9 x 12 mm. Endometrial biopsy was performed and sterile fashion after the cervix was cleansed with Betadine solution. The uterus sounded to 7-1/2 cm. Moderate amount of tissue was obtained and submitted for histological evaluation.  Assessment/plan: Patient with worsening dysmenorrhea menorrhagia and dyspareunia with findings of sonohysterogram of endometrial polyp. Patient would like to proceed with definitive surgery and is scheduled for total laparoscopic hysterectomy in December and we will see her the week before for preoperative consultation. We're waiting for her primary physician Dr. Bobbe Medico to give her medical clearance.

## 2012-07-17 NOTE — Telephone Encounter (Signed)
Pt informed and apt to be done on Nov 26 at [facility]

## 2012-07-17 NOTE — Telephone Encounter (Signed)
Yes patient needs her urodynamic testing since she told me she was having stress urinary incontinence and I will like to have this information prior to her surgery.  I would like for her to have her urodynamic testing in the month of November. Please schedule. Thank you.

## 2012-07-17 NOTE — Telephone Encounter (Signed)
Pt finally called me back today about my message on the urodynamics. Does she still need this? She is set up for surgery for a HYST in Dec. She had her [facility] today. Pls advise. KW

## 2012-07-18 LAB — URINE CULTURE
Colony Count: NO GROWTH
Organism ID, Bacteria: NO GROWTH

## 2012-07-22 ENCOUNTER — Telehealth: Payer: Self-pay | Admitting: Gynecology

## 2012-07-22 MED ORDER — MEGESTROL ACETATE 40 MG PO TABS: 40.0000 mg | ORAL_TABLET | Freq: Two times a day (BID) | ORAL | Status: DC

## 2012-07-22 NOTE — Telephone Encounter (Signed)
Patient is scheduled for [facility] on Dec 19th. Last Friday Jul 17, 2012 she had [facility]. Patient informed pathology benign.  Patient said that when she came in Friday she was having some light spotting.  Friday night she went to bathroom and there were "clots everywhere" and she had begun to bleed. SHe said she has been bleeding heavy ever since and bad cramps.  Wearing her down. She c/o "bleeding like crazy" and I asked her how often she is soaking a pad.  She said she had changed 3 x today.  What to rec?

## 2012-07-22 NOTE — Telephone Encounter (Signed)
Patient advised and instructed. Rx e-scribed to her.

## 2012-07-22 NOTE — Telephone Encounter (Signed)
Please call in a prescription for Megace 40 mg to take 1 by mouth twice a day #30. After a couple a days if her bleeding is not slow down she can increase it to 3 times a day.

## 2012-08-11 ENCOUNTER — Ambulatory Visit: Payer: 59

## 2012-08-20 ENCOUNTER — Ambulatory Visit (INDEPENDENT_AMBULATORY_CARE_PROVIDER_SITE_OTHER): Payer: 59 | Admitting: Gynecology

## 2012-08-20 ENCOUNTER — Encounter: Payer: Self-pay | Admitting: Gynecology

## 2012-08-20 VITALS — BP 154/100

## 2012-08-20 DIAGNOSIS — N920 Excessive and frequent menstruation with regular cycle: Secondary | ICD-10-CM

## 2012-08-20 DIAGNOSIS — R140 Abdominal distension (gaseous): Secondary | ICD-10-CM

## 2012-08-20 DIAGNOSIS — R142 Eructation: Secondary | ICD-10-CM

## 2012-08-20 DIAGNOSIS — Z01818 Encounter for other preprocedural examination: Secondary | ICD-10-CM

## 2012-08-20 DIAGNOSIS — N946 Dysmenorrhea, unspecified: Secondary | ICD-10-CM

## 2012-08-20 DIAGNOSIS — R102 Pelvic and perineal pain: Secondary | ICD-10-CM

## 2012-08-20 NOTE — Patient Instructions (Addendum)
Hypertension As your heart beats, it forces blood through your arteries. This force is your blood pressure. If the pressure is too high, it is called hypertension (HTN) or high blood pressure. HTN is dangerous because you may have it and not know it. High blood pressure may mean that your heart has to work harder to pump blood. Your arteries may be narrow or stiff. The extra work puts you at risk for heart disease, stroke, and other problems.  Blood pressure consists of two numbers, a higher number over a lower, 110/72, for example. It is stated as "110 over 72." The ideal is below 120 for the top number (systolic) and under 80 for the bottom (diastolic). Write down your blood pressure today. You should pay close attention to your blood pressure if you have certain conditions such as:  Heart failure.  Prior heart attack.  Diabetes  Chronic kidney disease.  Prior stroke.  Multiple risk factors for heart disease. To see if you have HTN, your blood pressure should be measured while you are seated with your arm held at the level of the heart. It should be measured at least twice. A one-time elevated blood pressure reading (especially in the Emergency Department) does not mean that you need treatment. There may be conditions in which the blood pressure is different between your right and left arms. It is important to see your caregiver soon for a recheck. Most people have essential hypertension which means that there is not a specific cause. This type of high blood pressure may be lowered by changing lifestyle factors such as:  Stress.  Smoking.  Lack of exercise.  Excessive weight.  Drug/tobacco/alcohol use.  Eating less salt. Most people do not have symptoms from high blood pressure until it has caused damage to the body. Effective treatment can often prevent, delay or reduce that damage. TREATMENT  When a cause has been identified, treatment for high blood pressure is directed at the  cause. There are a large number of medications to treat HTN. These fall into several categories, and your caregiver will help you select the medicines that are best for you. Medications may have side effects. You should review side effects with your caregiver. If your blood pressure stays high after you have made lifestyle changes or started on medicines,   Your medication(s) may need to be changed.  Other problems may need to be addressed.  Be certain you understand your prescriptions, and know how and when to take your medicine.  Be sure to follow up with your caregiver within the time frame advised (usually within two weeks) to have your blood pressure rechecked and to review your medications.  If you are taking more than one medicine to lower your blood pressure, make sure you know how and at what times they should be taken. Taking two medicines at the same time can result in blood pressure that is too low. SEEK IMMEDIATE MEDICAL CARE IF:  You develop a severe headache, blurred or changing vision, or confusion.  You have unusual weakness or numbness, or a faint feeling.  You have severe chest or abdominal pain, vomiting, or breathing problems. MAKE SURE YOU:   Understand these instructions.  Will watch your condition.  Will get help right away if you are not doing well or get worse. Document Released: 09/02/2005 Document Revised: 11/25/2011 Document Reviewed: 04/22/2008 [facility] Patient Information 2013 [place], [place].  Total Laparoscopic Hysterectomy A total laparoscopic hysterectomy is a minimally invasive surgery to remove your  uterus and cervix. This surgery is performed by making several small cuts (incisions) in your abdomen. It can also be done with a thin, lighted tube (laparoscope) inserted into 2 small incisions in the lower abdomen. Your fallopian tubes and ovaries can be removed (bilateral salpingo-oopherectomy) during this surgery as well.If a total laparoscopic  hysterectomy is started and it is not safe to continue, the laparoscopic surgery will be converted to an open abdominal surgery. You will not have menstrual periods or be able to get pregnant after having this surgery. If a bilateral salpingo-oopherectomy was performed before menopause, you will go through a sudden (abrupt) menopause. This can be helped with hormone medicines. Benefits of minimally invasive surgery include:  Less pain.  Less risk of blood loss.  Less risk of infection.  Quicker return to normal activities.  Usually a 1 night stay in the hospital.  Overall patient satisfaction. LET YOUR CAREGIVER KNOW ABOUT:  Any history of abnormal Pap tests.  Allergies to food or medicine.  Medicines taken, including vitamins, herbs, eyedrops, over-the-counter medicines, and creams.  Use of steroids (by mouth or creams).  Previous problems with anesthetics or numbing medicines.  History of bleeding problems or blood clots.  Previous surgery.  Other health problems, including diabetes and kidney problems.  Desire for future fertility.  Any infections or colds you may have developed.  Symptoms of irregular or heavy periods, weight loss, or urinary or bowel changes. RISKS AND COMPLICATIONS   Bleeding.  Blood clots in the legs or lung.  Infection.  Injury to surrounding organs.  Problems with anesthesia.  Early menopause symptoms (hot flashes, night sweats, insomnia).  Risk of conversion to an open abdominal incision. BEFORE THE PROCEDURE  Ask your caregiver about changing or stopping your regular medicines.  Do not take aspirin or blood thinners (anticoagulants) for 1 week before the surgery, or as told by your caregiver.  Do not eat or drink anything for 8 hours before the surgery, or as told by your caregiver.  Quit smoking if you smoke.  Arrange for a ride home after surgery and for someone to help you at home during recovery. PROCEDURE   You will be  given antibiotic medicine.  An intravenous (IV) line will be placed in your arm. You will be given medicine to make you sleep (general anesthetic).  A gas (carbon dioxide) will be used to inflate your abdomen. This will allow your surgeon to look inside your abdomen, perform your surgery, and treat any other problems found if necessary.  Three or four small incisions (often less than  inch) will be made in your abdomen. One of these incisions will be made in the area of your belly button (navel). The laparoscope will be inserted into the incision. Your surgeon will look through the laparoscope while doing your procedure.  Other surgical instruments will be inserted through the other incisions.  The uterus may be removed through the vagina or cut into small pieces and removed through the small incisions.  Your incisions will be closed. AFTER THE PROCEDURE  The gas will be released from inside your abdomen.  You will be taken to the recovery area where a nurse will watch and check your progress. Once you are awake, stable, and taking fluids well, without other problems, you will return to your room or be allowed to go home.  There is usually minimal discomfort following the surgery because the incisions are so small.  You will be given pain medicine while you  are in the hospital and for when you go home.  Try to have someone with you the first 3 to 5 days after you go home.  Follow up with your surgeon in 2 to 4 weeks after surgery to evaluate your progress. Document Released: 06/30/2007 Document Revised: 11/25/2011 Document Reviewed: 04/19/2011 [facility] Patient Information 2013 [place], [place].

## 2012-08-20 NOTE — Progress Notes (Signed)
Dana [name] Patient is a 46 year old was seen in the office on October 4 and prior to that September 26 see previous notes.. Patient with history of severe dysmenorrhea menorrhagia along with dyspareunia and bloating. She had an ultrasound October 4 which demonstrated the following:   Ultrasound report: Uterus measures 9.3 x 5.7 x 4.8 cm with endometrial stripe of 8.7 mm. A posterior fibroid measuring 12 x 8 mm was noted. A possible fibroid was seen in the endometrial cavity measured 9 x 6 x 8 mm ovaries otherwise appeared to be normal. No free fluid in the cul-de-sac.  On 07/17/2012 she had an endometrial biopsy and sonohysterogram.Sonohysterogram demonstrated an anterior polyp measuring 12 x 9 x 12 mm. endometrial biopsy results:Endometrium, biopsy, uterus - BENIGN POLYPOID SECRETORY PHASE ENDOMETRIUM. - BENIGN ENDOCERVICAL MUCOSA.   Patient presented to the office today for preoperative examination. She is being followed by Dr. Bobbe Medico her primary physician. Patient does have history of hypertension for which she's on HCTZ 12.5 mg daily and hypercholesterolemia for which she is on Crestor 10 mg daily. When she arrived to the office today her blood pressure was 154/100 and on repeat 156/102 and she stated she did not taken her blood pressure medication today and was upset from work. She denied headaches or blurry vision.   Pertinent Gynecological History: Menses: 7-10 days Bleeding: 7-10 days Contraception: tubal ligation DES exposure: denies Blood transfusions: none Sexually transmitted diseases: no past history Previous GYN Procedures: Tubal ligation  Last mammogram: normal Date: 2013 Last pap: normal Date: 2012 OB History: G 3, P 4   Menstrual History: Menarche age: 57 Patient's last menstrual period was 08/17/2012.    Past Medical History  Diagnosis Date  . Tobacco use disorder   . Mild hypertension   . Hypercholesterolemia   . Obesity   . Breast mass, right   . DJD  (degenerative joint disease)   . Back pain with radiation   . Headache   . Anxiety   . Groin abscess     Past Surgical History  Procedure Date  . Tubal ligation   . Lumbar disc surgery   . Finger surgery     Right index-excision of mass   . Breast lumpectomy   . Foot surgery     Family History  Problem Relation Age of Onset  . Diabetes Father   . Diabetes Mother   . Prostate cancer Paternal Grandfather   . Cancer Paternal Grandfather     prostate  . Breast cancer Maternal Aunt 66    Social History:  reports that she has been smoking Cigarettes.  She has been smoking about .25 packs per day. She has never used smokeless tobacco. She reports that she drinks about 7 ounces of alcohol per week. She reports that she does not use illicit drugs.  Allergies:  Allergies  Allergen Reactions  . Penicillins     REACTION: itching and swelling     (Not in a hospital admission)  REVIEW OF SYSTEMS: A ROS was performed and pertinent positives and negatives are included in the history.  GENERAL: No fevers or chills. HEENT: No change in vision, no earache, sore throat or sinus congestion. NECK: No pain or stiffness. CARDIOVASCULAR: No chest pain or pressure. No palpitations. PULMONARY: No shortness of breath, cough or wheeze. GASTROINTESTINAL: No abdominal pain, nausea, vomiting or diarrhea, melena or bright red blood per rectum. GENITOURINARY: No urinary frequency, urgency, hesitancy or dysuria. MUSCULOSKELETAL: No joint or muscle pain, no back  pain, no recent trauma. DERMATOLOGIC: No rash, no itching, no lesions. ENDOCRINE: No polyuria, polydipsia, no heat or cold intolerance. No recent change in weight. HEMATOLOGICAL: No anemia or easy bruising or bleeding. NEUROLOGIC: No headache, seizures, numbness, tingling or weakness. PSYCHIATRIC: No depression, no loss of interest in normal activity or change in sleep pattern.     Blood pressure 154/100, last menstrual period 08/17/2012.  Physical  Exam:  HEENT:unremarkable Neck:Supple, midline, no thyroid megaly, no carotid bruits Lungs:  Clear to auscultation no rhonchi's or wheezes Heart:Regular rate and rhythm, no murmurs or gallops Breast Exam: Not examined today Abdomen: Soft nontender no rebound or guarding Pelvic:BUS within normal limits Vagina: No lesions or discharge Cervix: No lesions or discharge Uterus: Slightly anteverted normal size shape and consistency nontender and mobile Adnexa: No palpable masses or tenderness Extremities: No cords, no edema Rectal: Unremarkable   Assessment/Plan: Patient with worsening dysmenorrhea and menorrhagia along with dyspareunia scheduled to undergo total laparoscopic hysterectomy with ovarian conservation. When she presented to the office today her blood pressures found to be elevated but she is not taking her diuretic today. We're going to make arrangements for her to see her primary care physician within 24 hours for further evaluation and assessment and perhaps additional antihypertensive medication before her surgery which is scheduled for December 19. The following risk for the surgery were discussed with the patient:                        Patient was counseled as to the risk of surgery to include the following:  1. Infection (prohylactic antibiotics will be administered)  2. DVT/Pulmonary Embolism (prophylactic pneumo compression stockings will be used)  3.Trauma to internal organs requiring additional surgical procedure to repair any injury to     Internal organs requiring perhaps additional hospitalization days.  4.Hemmorhage requiring transfusion and blood products which carry risks such as  anaphylactic reaction, hepatitis and AIDS  Patient had received literature information on the procedure scheduled and all her questions were answered and accepts all risk.  [facility] HMD5:56 PMTD@   Corby Villasenor H 08/20/2012, 5:45 PM

## 2012-08-24 ENCOUNTER — Ambulatory Visit (INDEPENDENT_AMBULATORY_CARE_PROVIDER_SITE_OTHER): Payer: 59 | Admitting: Adult Health

## 2012-08-24 ENCOUNTER — Encounter: Payer: Self-pay | Admitting: Adult Health

## 2012-08-24 VITALS — BP 160/90 | HR 91 | Temp 97.5°F | Ht 64.0 in | Wt 246.2 lb

## 2012-08-24 DIAGNOSIS — I10 Essential (primary) hypertension: Secondary | ICD-10-CM

## 2012-08-24 MED ORDER — LISINOPRIL 20 MG PO TABS: 20.0000 mg | ORAL_TABLET | Freq: Every day | ORAL | Status: DC

## 2012-08-24 NOTE — Assessment & Plan Note (Signed)
B/p not at goal  Advised on healthy lifestyle modifications .  Pre op Labs are pending.   Plan Begin Lisinopril 20mg  daily  Low salt diet  Weight loss and exercise  Avoid NSAIDS-advil, motrin, aleve, etc and sudafed/decongestants.  follow up Dr. Kriste Basque  In 6 weeks and As needed   Please contact office for sooner follow up if symptoms do not improve or worsen or seek emergency care

## 2012-08-24 NOTE — Progress Notes (Signed)
Subjective:    Patient ID: Dana [name], female    DOB: 07-Jul-1966, 46 y.o.   MRN: [id number]  HPI 46 y/o BF  ~  January 12, 2010:  Yearly ROV & CPX> recent stress w/ f/u mammogram showing enlarging lesion right breast... had sm nodule (~22mm) at 6 o'clock position right breast 4/10 w/ core biopsy = fibroadenoma... f/u mammogram 4/11 w/ 1.6 x 1cm lesion w/ lobulation... she is referred to [facility] & awaiting consult for excision 5/2 DrRosenbower... she has lost 20# this yr on diet & walking... BP still borderline controlled on diet alone (she refused meds) w/ BP 130/90 today... she was started on Prav40 for her Chol last yr but she stopped it on her own 2 mo ago... still smoking several cigs per day & not motivated to quit.  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  ~  March 17, 2012:  311mo ROV & CPX> Vincentina stopped her Prav40 last yr due to upset stomach (sounded more like IBS) & didn't call to notify us or deal w/ the side effect, she has been off it for many months & her FLP is reranged at baseline w/ LDL=154; we discussed options & she agreed to try LOW DOSE Crestor5mg /d...  She wants refills of her Xanax & Vicodin please...    We reviewed prob list, meds, xrays and labs> see below for updates>> CXR 7/13 showed heart at upper lim norm, lungs clear, DJD in spine, NAD.Marland KitchenMarland Kitchen EKG 7/13 showed NSR, rate81, WNL, NAD... LABS 7/13:  FLP-  not at goal & Cres5 started;  Chems- wnl;  CBC- wnl;  TSH=1.30  ~  July 16, 2012:  11mo ROV & [name] has a hysterectomy sched for 09/03/12 by DrFernandez> Epic records reveals dysmenorrhea & menorrhagia, fibroids & left ovarian mass seen on prev ultrasound;  Her BP was noted to be in the 140/90 range at DrFernandez office & he started her on HCTZ 12.5mg  Sep2013;  Today she is c/o dysuria- pain, pressure x2-3d & we checked UA, C&S, started empiric Rx w/ Cipro250Bid...    Smoker> she has decreased smoking to 0-1cig/d "when I'm nervous" but doesn't buy them anymore; last CXR 7/13 was clear; she is encouraged to quit completely...    HBP> on HCT 12.5mg /d now + low sodium diet, weight control, etc; BP= 126/82 today & we reviewed the assoc betw her weight & BP- encouraged taking med everyday, + diet & exercise...    Chol> on Cres5 started 7/13; f/u FLP showed TChol 221, TG 64, HDL 55, LDL 162; rec to incr Cres10...    Obesity> weight=244# (up 2#) & BMI=44; we reviewed diet, exercise, & the option for Bariatic surg...    GI eval 8/13 by DrGessner w/ EGD was normal, Colonoscopy was also WNL- no lesion ident & random Bxs= benign colonic mucosa;  She had been c/o n/v, abd pain & diarrhea- frequently post prandial; tried Bentyl20 prn w/o help, changed to Zofran & Glycopyrrolate 2mg Bid...    GU> r/o UTI w/ her acute symtoms today; UA showed few wbc & bact- We decided to treat w/ Cipro250Bid pending culture data...    GYN> per DrFernandez & notes reviewed...    DJD/ LBP> on Mobic7.5 & Vicodin prn; she has seen DrDean & DrNitka in the past w/ L2-3 microdiskectomy 4/09...    Anxiety> on Alprazolam 0.5mg  prn We reviewed prob list, meds, xrays and labs> see below for updates >>   Addendum>> Urine cult ret neg, no growth...  08/24/2012 Acute OV  Pt had pre-op clearance w/ GYN on 12.5.13 for Hysterectomy next week.  She was started on HCTZ for elevated b/p . B/p was elevated at GYN office last week ~ 160  Remains  elevated today at 160/90 .  No headache, chest pain , edema.  No new meds. Denies decongestants or NSAID use .  Has FH of HTN and previously on ACE but did not take (took herself off this in past)  We discussed dangers of uncontrolled HTN.           Problem List:  CIGARETTE SMOKER (ICD-305.1) - still smokes several cigarettes per day (~1/4ppd she says)... we discussed smoking cessation strategies and offered counselling, patches, Chantix, etc... she declines smoking cessation help & does not appear motivated to quit... She denies cough, sputum, hemoptysis, SOB, etc... ~  CXR 4/12 showed clear lungs, WNL.[name]. ~  7/13:  We reviewed smoking cessation strategies; CXR showed heart at upper lim norm, clear lungs, DJD spine... ~  10/13:  P[t indicates that she has decr to 0-1 cig/d & doesn't buy them anymore...  HYPERTENSION, MILD (ICD-401.1) - borderline HBP- ?related to her weight... we prev prescribed LisinoprilL/Hct Aug08 but she didn't follow up and stopped this on her own... she prefers to treat this without meds & we discussed diet + exercise needed...   ~  4/11:  BP=130/90> denies HA, fatigue, visual changes, CP, palipit, dizziness, syncope, dyspnea, edema, etc; pt advised to get a BP cuff and start monitoring BP at home, get on diet- 2 gm sodium, 1200 cal/d weight reducing diet, and incease exercise program... ~  4/12:  BP still ~128/82 despite 12# wt gain over the last yr... she declines medRx. ~  7/13:  BP= 136/74 & she is reminded to get wt down, avoid salt, etc... ~  9/13:  BP was ~140/90 at DrFernandez office & he started HCT 12.5mg /d... ~  10/13:  BP= 126/82 & she remains asymptomatic w/o HA, visual sx, CP, palpit, SOB, edema...  HYPERCHOLESTEROLEMIA (ICD-272.0) - she refused to fill Simva Rx in past & was rec to take Prav40 but she has refused all meds for Chol until 7/13=> started CRESTOR5. ~  FLP 8/08 showed TChol 244, TG 56, HDL 66, LDL 168- Simvastatin recommended, but pt  refused. ~  FLP 3/10 showed TChol 247, TG 52, HDL 71, LDL 166... rec> try Pravastatin40 + diet & exercise. ~  FLP 4/11 off Prav40 x58mo showed TChol 240, TG 50, HDL 76, LDL158... rec> get bck on Prav40 (she never did) ~  FLP 4/12 on diet alone showed TChol 233, TG 30, HDL 72, LDL 154... She is rec to take the statin, but she prefers diet alone... ~  FLP 7/13 on "diet" showed TChol 252, TG 80, HDL 85, LDL 154.[name] She is rec to start CRESTOR 5mg /d... ~  FLP 7/13 on Cres5 showed TChol 221, TG 64, HDL 55, LDL 162... Rec to incr CRESTOR 10mg /d & take it daily.  OBESITY (ICD-278.00) - diet/ exercise discussed w/ pt (again)... she refuses to consider bariatric approach... in her 5\' 4"  frame her BMI has been >40.[name]. ~  weight 2/07 = 228# ~  weight 8/08 = 237# ~  weight 3/09 = 243# ~  weight 3/10 = 240# ~  weight 4/11 = 220# ~  weight 4/12 = 232# ~  Weight 7/13 = 242# ~  Weight 10/13 = 244#  BREAST MASS, RIGHT (ICD-611.72) - benign fibroadenoma removed by DrRosenbower 6/11... ~  4/12: she had a f/u mammogram at [facility]= neg... ~  4/13:  F/u mammogram was neg...  DEGENERATIVE JOINT DISEASE (ICD-715.90) BACK PAIN WITH RADICULOPATHY (ICD-729.2) - presented 3/09 w/ left sided back pain into left leg... eval by DrDean & DrNitka w/  L2-3 microdiscectomy 4/09 by Susann Givens... ~  Recurrent LBP treated w/ rest, ice/heat, Advil, VICODIN Tid prn...  Hx of HEADACHE (ICD-784.0)  ANXIETY (ICD-300.00) - on ALPRAZOLAM 0.5mg  Tid Prn...   Past Surgical History  Procedure Date  . Tubal ligation   . Lumbar disc surgery   . Finger surgery     Right index-excision of mass   . Breast lumpectomy   . Foot surgery     Outpatient Encounter Prescriptions as of 08/24/2012  Medication Sig Dispense Refill  . ALPRAZolam (XANAX) 0.5 MG tablet 1/2 to 1 tablet three times daily as needed for nerves  90 tablet  5  . hydrochlorothiazide (MICROZIDE) 12.5 MG capsule Take 1 capsule (12.5 mg total) by mouth daily.  30 capsule  11  .  HYDROcodone-acetaminophen (NORCO/VICODIN) 5-325 MG per tablet Take 1 tablet by mouth every 6 (six) hours as needed.      . rosuvastatin (CRESTOR) 10 MG tablet Take 1 tablet (10 mg total) by mouth daily.  90 tablet  3  . glycopyrrolate (ROBINUL) 2 MG tablet Take 1 tablet (2 mg total) by mouth 2 (two) times daily.  60 tablet  0  . megestrol (MEGACE) 40 MG tablet Take 1 tablet (40 mg total) by mouth 2 (two) times daily.  30 tablet  0  . [DISCONTINUED] ciprofloxacin (CIPRO) 250 MG tablet Take 1 tablet (250 mg total) by mouth 2 (two) times daily.  14 tablet  0  . [DISCONTINUED] HYDROcodone-acetaminophen (VICODIN) 5-500 MG per tablet Take 1 tablet by mouth every 6 (six) hours as needed for pain.  20 tablet  0  . [DISCONTINUED] meloxicam (MOBIC) 7.5 MG tablet       . [DISCONTINUED] ondansetron (ZOFRAN) 4 MG tablet       . [DISCONTINUED] tranexamic acid (LYSTEDA) 650 MG TABS Take two tablets three times a day with the start of menses for 5 days  30 tablet  12    Allergies  Allergen Reactions  . Penicillins     REACTION: itching and swelling    Review of Systems Constitutional:   No  weight loss, night sweats,  Fevers, chills, fatigue, or  lassitude.  HEENT:   No headaches,  Difficulty swallowing,  Tooth/dental problems, or  Sore throat,                No sneezing, itching, ear ache, nasal congestion, post nasal drip,   CV:  No chest pain,  Orthopnea, PND, swelling in lower extremities, anasarca, dizziness, palpitations, syncope.   GI  No heartburn, indigestion, abdominal pain, nausea, vomiting, diarrhea, change in bowel  habits, loss of appetite, bloody stools.   Resp: No shortness of breath with exertion or at rest.  No excess mucus, no productive cough,  No non-productive cough,  No coughing up of blood.  No change in color of mucus.  No wheezing.  No chest wall deformity  Skin: no rash or lesions.  GU: no dysuria, change in color of urine, no urgency or frequency.  No flank pain, no  hematuria   MS:  No joint pain or swelling.  No decreased range of motion.  No back pain.  Psych:  No change in mood or affect. No depression or anxiety.  No memory loss.             Objective:   Physical Exam     WD, Obese, 45 y/o BF in NAD... GENERAL:  Alert & oriented; pleasant & cooperative... HEENT:  [place]/AT, EOM-wnl, PERRLA, EACs-clear, TMs-wnl, NOSE-clear, THROAT-clear & wnl. NECK:  Supple w/ fairROM; no JVD; normal carotid impulses w/o bruits; no thyromegaly or nodules palpated; no lymphadenopathy. CHEST:  Clear to P & A; without wheezes/ rales/ or rhonchi. HEART:  Regular Rhythm; without murmurs/ rubs/ or gallops. ABDOMEN:  Obese, soft & nontender normal bowel sounds; no organomegaly or masses detected. EXT: without deformities, mild arthritic changes; no varicose veins/ +venous insuffic/ no edema. NEURO:  CN's intact;  no focal neuro deficits... DERM:  No lesions noted; no rash etc...    Assessment & Plan:

## 2012-08-24 NOTE — Patient Instructions (Addendum)
Begin Lisinopril 20mg  daily  Low salt diet  Weight loss and exercise  Avoid NSAIDS-advil, motrin, aleve, etc and sudafed/decongestants.  follow up Dr. Kriste Basque  In 6 weeks and As needed   Please contact office for sooner follow up if symptoms do not improve or worsen or seek emergency care

## 2012-08-28 ENCOUNTER — Encounter (HOSPITAL_COMMUNITY): Payer: Self-pay | Admitting: Pharmacy Technician

## 2012-08-28 ENCOUNTER — Telehealth: Payer: Self-pay | Admitting: Adult Health

## 2012-08-28 ENCOUNTER — Encounter (HOSPITAL_COMMUNITY)
Admission: RE | Admit: 2012-08-28 | Discharge: 2012-08-28 | Disposition: A | Discharge status: Home / Self Care | Payer: 59 | Source: Ambulatory Visit | Attending: Gynecology | Admitting: Gynecology

## 2012-08-28 ENCOUNTER — Encounter (HOSPITAL_COMMUNITY): Payer: Self-pay

## 2012-08-28 LAB — COMPREHENSIVE METABOLIC PANEL
AST: 19 U/L (ref 0–37)
Albumin: 3.9 g/dL (ref 3.5–5.2)
Alkaline Phosphatase: 81 U/L (ref 39–117)
BUN: 17 mg/dL (ref 6–23)
Chloride: 97 mEq/L (ref 96–112)
Potassium: 3.9 mEq/L (ref 3.5–5.1)
Sodium: 135 mEq/L (ref 135–145)
Total Protein: 7.4 g/dL (ref 6.0–8.3)

## 2012-08-28 LAB — CBC
HCT: 41.6 % (ref 36.0–46.0)
MCHC: 31.7 g/dL (ref 30.0–36.0)
Platelets: 313 10*3/uL (ref 150–400)
RDW: 13.5 % (ref 11.5–15.5)
WBC: 8 10*3/uL (ref 4.0–10.5)

## 2012-08-28 LAB — URINALYSIS, ROUTINE W REFLEX MICROSCOPIC
Glucose, UA: NEGATIVE mg/dL
Hgb urine dipstick: NEGATIVE
Ketones, ur: NEGATIVE mg/dL
Leukocytes, UA: NEGATIVE
pH: 5.5 (ref 5.0–8.0)

## 2012-08-28 LAB — SURGICAL PCR SCREEN: MRSA, PCR: NEGATIVE

## 2012-08-28 NOTE — Patient Instructions (Addendum)
20 JEANNELLE WIENS  08/28/2012   Your procedure is scheduled on:  09/03/12  Enter through the Main Entrance of [facility] at 6 AM.  Pick up the phone at the desk and dial [phone].   Call this number if you have problems the morning of surgery: [phone]   Remember:   Do not eat food:After Midnight.  Do not drink clear liquids: After Midnight.  Take these medicines the morning of surgery with A SIP OF WATER: take blood pressure medications and may take Xanax if needed   Do not wear jewelry, make-up or nail polish.  Do not wear lotions, powders, or perfumes. You may wear deodorant.  Do not shave 48 hours prior to surgery.  Do not bring valuables to the hospital.  Contacts, dentures or bridgework may not be worn into surgery.  Leave suitcase in the car. After surgery it may be brought to your room.  For patients admitted to the hospital, checkout time is 11:00 AM the day of discharge.   Patients discharged the day of surgery will not be allowed to drive home.  Name and phone number of your driver: NA  Special Instructions: Shower using CHG 2 nights before surgery and the night before surgery.  If you shower the day of surgery use CHG.  Use special wash - you have one bottle of CHG for all showers.  You should use approximately 1/3 of the bottle for each shower.   Please read over the following fact sheets that you were given: MRSA Information

## 2012-08-28 NOTE — Telephone Encounter (Signed)
Dr. Fontaine No office is calling and stating that pt is scheduled for surgery next Thursday and the pt had her pre-op labs and her GFR is low and Dr. Lily Peer wanted to make sure the pt is still ok to have the surgery for next week.  SN please advise. Thanks  Allergies  Allergen Reactions  . Penicillins Itching and Swelling

## 2012-08-28 NOTE — Telephone Encounter (Signed)
Per SN---kidney function/creat is normal at 0.92.  Pt is ok for surgery.  Her GFR number is ok.  i called and spoke with cathy and she is aware of SN recs and will forward this message to Dr. Lily Peer.  Nothing further is needed.

## 2012-09-02 MED ORDER — CIPROFLOXACIN IN D5W 400 MG/200ML IV SOLN
400.0000 mg | INTRAVENOUS | Status: AC
  Administered 2012-09-03: 400 mg via INTRAVENOUS
  Filled 2012-09-02: qty 200

## 2012-09-02 MED ORDER — CLINDAMYCIN PHOSPHATE 900 MG/50ML IV SOLN
900.0000 mg | INTRAVENOUS | Status: AC
  Administered 2012-09-03: 900 mg via INTRAVENOUS
  Filled 2012-09-02: qty 50

## 2012-09-03 ENCOUNTER — Ambulatory Visit (HOSPITAL_COMMUNITY): Payer: 59 | Admitting: Anesthesiology

## 2012-09-03 ENCOUNTER — Observation Stay (HOSPITAL_COMMUNITY)
Admission: RE | Admit: 2012-09-03 | Discharge: 2012-09-04 | Disposition: A | Discharge status: Home / Self Care | Payer: 59 | Source: Ambulatory Visit | Attending: Gynecology | Admitting: Gynecology

## 2012-09-03 ENCOUNTER — Encounter (HOSPITAL_COMMUNITY)
Admission: RE | Disposition: A | Discharge status: Home / Self Care | Payer: Self-pay | Source: Ambulatory Visit | Attending: Gynecology

## 2012-09-03 ENCOUNTER — Encounter (HOSPITAL_COMMUNITY): Payer: Self-pay | Admitting: Anesthesiology

## 2012-09-03 DIAGNOSIS — N946 Dysmenorrhea, unspecified: Secondary | ICD-10-CM | POA: Insufficient documentation

## 2012-09-03 DIAGNOSIS — D259 Leiomyoma of uterus, unspecified: Secondary | ICD-10-CM | POA: Insufficient documentation

## 2012-09-03 DIAGNOSIS — N8000 Endometriosis of the uterus, unspecified: Secondary | ICD-10-CM | POA: Insufficient documentation

## 2012-09-03 DIAGNOSIS — N949 Unspecified condition associated with female genital organs and menstrual cycle: Secondary | ICD-10-CM

## 2012-09-03 DIAGNOSIS — N920 Excessive and frequent menstruation with regular cycle: Secondary | ICD-10-CM

## 2012-09-03 DIAGNOSIS — Z23 Encounter for immunization: Secondary | ICD-10-CM | POA: Insufficient documentation

## 2012-09-03 DIAGNOSIS — Z9889 Other specified postprocedural states: Secondary | ICD-10-CM

## 2012-09-03 DIAGNOSIS — N841 Polyp of cervix uteri: Secondary | ICD-10-CM | POA: Insufficient documentation

## 2012-09-03 DIAGNOSIS — IMO0002 Reserved for concepts with insufficient information to code with codable children: Secondary | ICD-10-CM | POA: Insufficient documentation

## 2012-09-03 HISTORY — PX: BILATERAL SALPINGECTOMY: SHX5743

## 2012-09-03 HISTORY — PX: LAPAROSCOPIC HYSTERECTOMY: SHX1926

## 2012-09-03 LAB — PREGNANCY, URINE: Preg Test, Ur: NEGATIVE

## 2012-09-03 SURGERY — HYSTERECTOMY, TOTAL, LAPAROSCOPIC
Anesthesia: General | Site: Abdomen | Wound class: Clean Contaminated

## 2012-09-03 MED ORDER — KETOROLAC TROMETHAMINE 30 MG/ML IJ SOLN
INTRAMUSCULAR | Status: AC
  Filled 2012-09-03: qty 1

## 2012-09-03 MED ORDER — DEXAMETHASONE SODIUM PHOSPHATE 10 MG/ML IJ SOLN
INTRAMUSCULAR | Status: AC
  Filled 2012-09-03: qty 1

## 2012-09-03 MED ORDER — HYDROMORPHONE 0.3 MG/ML IV SOLN
INTRAVENOUS | Status: DC
  Administered 2012-09-03: 0.599 mg via INTRAVENOUS
  Administered 2012-09-03: 1.19 mg via INTRAVENOUS
  Administered 2012-09-03: 1.59 mg via INTRAVENOUS
  Administered 2012-09-03: 11:00:00 via INTRAVENOUS
  Administered 2012-09-04: 1.79 mg via INTRAVENOUS
  Administered 2012-09-04: 0.799 mg via INTRAVENOUS
  Filled 2012-09-03: qty 25

## 2012-09-03 MED ORDER — ROCURONIUM BROMIDE 100 MG/10ML IV SOLN
INTRAVENOUS | Status: DC | PRN
  Administered 2012-09-03: 10 mg via INTRAVENOUS
  Administered 2012-09-03: 45 mg via INTRAVENOUS
  Administered 2012-09-03: 5 mg via INTRAVENOUS
  Administered 2012-09-03: 10 mg via INTRAVENOUS

## 2012-09-03 MED ORDER — FENTANYL CITRATE 0.05 MG/ML IJ SOLN
INTRAMUSCULAR | Status: AC
  Filled 2012-09-03: qty 2

## 2012-09-03 MED ORDER — LIDOCAINE HCL (CARDIAC) 20 MG/ML IV SOLN
INTRAVENOUS | Status: DC | PRN
  Administered 2012-09-03: 80 mg via INTRAVENOUS

## 2012-09-03 MED ORDER — LACTATED RINGERS IR SOLN
Status: DC | PRN
  Administered 2012-09-03: 3000 mL

## 2012-09-03 MED ORDER — NALOXONE HCL 0.4 MG/ML IJ SOLN: 0.4000 mg | INTRAMUSCULAR | Status: DC | PRN

## 2012-09-03 MED ORDER — HYDROMORPHONE HCL PF 1 MG/ML IJ SOLN
INTRAMUSCULAR | Status: AC
  Filled 2012-09-03: qty 1

## 2012-09-03 MED ORDER — LIDOCAINE HCL (CARDIAC) 20 MG/ML IV SOLN
INTRAVENOUS | Status: AC
  Filled 2012-09-03: qty 5

## 2012-09-03 MED ORDER — PNEUMOCOCCAL VAC POLYVALENT 25 MCG/0.5ML IJ INJ
0.5000 mL | INJECTION | INTRAMUSCULAR | Status: AC
  Administered 2012-09-04: 0.5 mL via INTRAMUSCULAR
  Filled 2012-09-03: qty 0.5

## 2012-09-03 MED ORDER — ONDANSETRON HCL 4 MG/2ML IJ SOLN
INTRAMUSCULAR | Status: AC
  Filled 2012-09-03: qty 2

## 2012-09-03 MED ORDER — BUPIVACAINE HCL (PF) 0.25 % IJ SOLN
INTRAMUSCULAR | Status: AC
  Filled 2012-09-03: qty 30

## 2012-09-03 MED ORDER — ONDANSETRON HCL 4 MG/2ML IJ SOLN
INTRAMUSCULAR | Status: DC | PRN
  Administered 2012-09-03: 4 mg via INTRAVENOUS

## 2012-09-03 MED ORDER — ALPRAZOLAM 0.5 MG PO TABS
0.5000 mg | ORAL_TABLET | Freq: Three times a day (TID) | ORAL | Status: DC | PRN
  Administered 2012-09-03: 0.5 mg via ORAL
  Filled 2012-09-03: qty 1

## 2012-09-03 MED ORDER — NEOSTIGMINE METHYLSULFATE 1 MG/ML IJ SOLN
INTRAMUSCULAR | Status: AC
  Filled 2012-09-03: qty 1

## 2012-09-03 MED ORDER — FENTANYL CITRATE 0.05 MG/ML IJ SOLN
INTRAMUSCULAR | Status: AC
  Filled 2012-09-03: qty 5

## 2012-09-03 MED ORDER — MIDAZOLAM HCL 2 MG/2ML IJ SOLN
INTRAMUSCULAR | Status: AC
  Filled 2012-09-03: qty 2

## 2012-09-03 MED ORDER — HYDROMORPHONE HCL PF 1 MG/ML IJ SOLN
INTRAMUSCULAR | Status: AC
  Administered 2012-09-03: 0.5 mg via INTRAVENOUS
  Filled 2012-09-03: qty 1

## 2012-09-03 MED ORDER — GLYCOPYRROLATE 0.2 MG/ML IJ SOLN
INTRAMUSCULAR | Status: DC | PRN
  Administered 2012-09-03: 0.6 mg via INTRAVENOUS

## 2012-09-03 MED ORDER — NEOSTIGMINE METHYLSULFATE 1 MG/ML IJ SOLN
INTRAMUSCULAR | Status: DC | PRN
  Administered 2012-09-03: 4 mg via INTRAVENOUS

## 2012-09-03 MED ORDER — LACTATED RINGERS IV SOLN
INTRAVENOUS | Status: DC
  Administered 2012-09-03 (×2): via INTRAVENOUS

## 2012-09-03 MED ORDER — PROPOFOL 10 MG/ML IV EMUL
INTRAVENOUS | Status: DC | PRN
  Administered 2012-09-03: 200 mg via INTRAVENOUS

## 2012-09-03 MED ORDER — LISINOPRIL 20 MG PO TABS
20.0000 mg | ORAL_TABLET | Freq: Every day | ORAL | Status: DC
  Filled 2012-09-03: qty 1

## 2012-09-03 MED ORDER — LACTATED RINGERS IV SOLN
INTRAVENOUS | Status: DC
  Administered 2012-09-03 (×3): via INTRAVENOUS

## 2012-09-03 MED ORDER — ONDANSETRON HCL 4 MG/2ML IJ SOLN: 4.0000 mg | Freq: Four times a day (QID) | INTRAMUSCULAR | Status: DC | PRN

## 2012-09-03 MED ORDER — GLYCOPYRROLATE 0.2 MG/ML IJ SOLN
INTRAMUSCULAR | Status: AC
  Filled 2012-09-03: qty 3

## 2012-09-03 MED ORDER — DEXAMETHASONE SODIUM PHOSPHATE 4 MG/ML IJ SOLN
INTRAMUSCULAR | Status: DC | PRN
  Administered 2012-09-03: 10 mg via INTRAVENOUS

## 2012-09-03 MED ORDER — BUPIVACAINE HCL (PF) 0.25 % IJ SOLN
INTRAMUSCULAR | Status: DC | PRN
  Administered 2012-09-03: 10 mL

## 2012-09-03 MED ORDER — PHENYLEPHRINE HCL 10 MG/ML IJ SOLN
INTRAMUSCULAR | Status: DC | PRN
  Administered 2012-09-03: 80 ug via INTRAVENOUS

## 2012-09-03 MED ORDER — DIPHENHYDRAMINE HCL 50 MG/ML IJ SOLN: 12.5000 mg | Freq: Four times a day (QID) | INTRAMUSCULAR | Status: DC | PRN

## 2012-09-03 MED ORDER — FENTANYL CITRATE 0.05 MG/ML IJ SOLN
INTRAMUSCULAR | Status: DC | PRN
  Administered 2012-09-03 (×4): 50 ug via INTRAVENOUS
  Administered 2012-09-03: 100 ug via INTRAVENOUS
  Administered 2012-09-03: 50 ug via INTRAVENOUS

## 2012-09-03 MED ORDER — DIPHENHYDRAMINE HCL 12.5 MG/5ML PO ELIX: 12.5000 mg | ORAL_SOLUTION | Freq: Four times a day (QID) | ORAL | Status: DC | PRN

## 2012-09-03 MED ORDER — HYDROMORPHONE HCL PF 1 MG/ML IJ SOLN
INTRAMUSCULAR | Status: DC | PRN
  Administered 2012-09-03: 1 mg via INTRAVENOUS

## 2012-09-03 MED ORDER — KETOROLAC TROMETHAMINE 30 MG/ML IJ SOLN
INTRAMUSCULAR | Status: DC | PRN
  Administered 2012-09-03: 60 mg via INTRAVENOUS

## 2012-09-03 MED ORDER — MIDAZOLAM HCL 5 MG/5ML IJ SOLN
INTRAMUSCULAR | Status: DC | PRN
  Administered 2012-09-03: 2 mg via INTRAVENOUS

## 2012-09-03 MED ORDER — ROCURONIUM BROMIDE 50 MG/5ML IV SOLN
INTRAVENOUS | Status: AC
  Filled 2012-09-03: qty 1

## 2012-09-03 MED ORDER — SODIUM CHLORIDE 0.9 % IJ SOLN: 9.0000 mL | INTRAMUSCULAR | Status: DC | PRN

## 2012-09-03 MED ORDER — HYDROMORPHONE HCL PF 1 MG/ML IJ SOLN
0.2500 mg | INTRAMUSCULAR | Status: DC | PRN
  Administered 2012-09-03 (×4): 0.5 mg via INTRAVENOUS

## 2012-09-03 MED ORDER — PROPOFOL 10 MG/ML IV EMUL
INTRAVENOUS | Status: AC
  Filled 2012-09-03: qty 20

## 2012-09-03 SURGICAL SUPPLY — 34 items
ADH SKN CLS APL DERMABOND .7 (GAUZE/BANDAGES/DRESSINGS) ×2
BLADE SURG 15 STRL LF C SS BP (BLADE) ×2 IMPLANT
BLADE SURG 15 STRL SS (BLADE) ×3
CLOTH BEACON ORANGE TIMEOUT ST (SAFETY) ×3 IMPLANT
CONT PATH 16OZ SNAP LID 3702 (MISCELLANEOUS) ×1 IMPLANT
COVER MAYO STAND STRL (DRAPES) ×3 IMPLANT
COVER TABLE BACK 60X90 (DRAPES) ×3 IMPLANT
DERMABOND ADVANCED (GAUZE/BANDAGES/DRESSINGS) ×1
DERMABOND ADVANCED .7 DNX12 (GAUZE/BANDAGES/DRESSINGS) ×2 IMPLANT
DEVICE SUTURE ENDOST 10MM (ENDOMECHANICALS) ×1 IMPLANT
ENDOSTITCH 0 SINGLE 48 (SUTURE) ×5 IMPLANT
EVACUATOR SMOKE 8.L (FILTER) ×3 IMPLANT
GLOVE BIO SURGEON STRL SZ7.5 (GLOVE) ×3 IMPLANT
GLOVE BIOGEL PI IND STRL 8 (GLOVE) ×2 IMPLANT
GLOVE BIOGEL PI INDICATOR 8 (GLOVE) ×1
GLOVE ECLIPSE 7.5 STRL STRAW (GLOVE) ×6 IMPLANT
GOWN PREVENTION PLUS LG XLONG (DISPOSABLE) ×6 IMPLANT
NS IRRIG 1000ML POUR BTL (IV SOLUTION) ×3 IMPLANT
OCCLUDER COLPOPNEUMO (BALLOONS) ×1 IMPLANT
PACK LAVH (CUSTOM PROCEDURE TRAY) ×3 IMPLANT
SCALPEL HARMONIC ACE (MISCELLANEOUS) ×3 IMPLANT
SCISSORS LAP 5X35 DISP (ENDOMECHANICALS) ×1 IMPLANT
SET IRRIG TUBING LAPAROSCOPIC (IRRIGATION / IRRIGATOR) ×3 IMPLANT
SUT VICRYL 0 UR6 27IN ABS (SUTURE) ×3 IMPLANT
SUT VICRYL RAPIDE 3 0 (SUTURE) ×6 IMPLANT
SYR 50ML LL SCALE MARK (SYRINGE) ×3 IMPLANT
SYR BULB IRRIGATION 50ML (SYRINGE) ×3 IMPLANT
TIP UTERINE 6.7X8CM BLUE DISP (MISCELLANEOUS) ×1 IMPLANT
TOWEL OR 17X24 6PK STRL BLUE (TOWEL DISPOSABLE) ×6 IMPLANT
TRAY FOLEY CATH 14FR (SET/KITS/TRAYS/PACK) ×3 IMPLANT
TROCAR XCEL NON-BLD 11X100MML (ENDOMECHANICALS) ×6 IMPLANT
TROCAR XCEL NON-BLD 5MMX100MML (ENDOMECHANICALS) ×4 IMPLANT
WARMER LAPAROSCOPE (MISCELLANEOUS) ×3 IMPLANT
WATER STERILE IRR 1000ML POUR (IV SOLUTION) ×3 IMPLANT

## 2012-09-03 NOTE — Op Note (Signed)
09/03/2012  9:22 AM  PATIENT:  Dana [name]  46 y.o. female  PRE-OPERATIVE DIAGNOSIS:  Dysfunctional Uterine Bleeding, Fibroids  POST-OPERATIVE DIAGNOSIS:  Dysfunctional Uterine Bleeding, Fibroids  PROCEDURE:  Procedure(s): HYSTERECTOMY TOTAL LAPAROSCOPIC BILATERAL SALPINGECTOMY  SURGEON:  Surgeon(s): Ok Edwards, MD Dara Lords, MD  ANESTHESIA:   general  FINDINGS: Leiomyomatous uteri with evidence of previous tubal transection (sterilization). Normal-appearing ovaries bilateral.  DESCRIPTION OF OPERATION: The patient was taken to the operating room where she underwent successful general endotracheal anesthesia. A time out procedure was performed prior to start time and agreed upon by all OR Staff. Confirmation of the correct patient, procedure and site were established. Patient had pneumatic compression stockings for DVT prophylaxis. Patient also had received Cleocin and Cipro IV for prophylaxis due to her penicillin allergy. The patient was then positioned in the laparoscopic lithotomy position, with aseptic prepping and draping.  After dilating the cervix to Hegar 8, the uterus was sounded to measure its length, and then a RUMI disposable tip less than or equal to the length that was sounded was used (8). The KOH Cup was selected based on the size of the cervix and ascertaining that it covered it (3.5). After connecting the disposable tip to the RUMI uterine manipulator,the pneumo-occluder was placed over the tip and shaft of the RUMI, and then fit the KOH cup over the tip. Using lateral and upper and lower vaginal retractors,  a tenaculum was placed on the anterior cervix lip and pulled it down. Next, surgical gel was applied to the RUMI tip and inserted it in the cervical os. The posterior retractor was removed, as well as the anterior one. With sidewall retractors in place, the tip was inserted into the uterus and the cup placed in the vagina. . At this point, while  releasing the first tenaculum, I introduced a second tenaculum through the fenestration of the cup to catch the cervix. I then pushed the RUMI tip further into the uterus to engage the cup around the cervix and against the fornix by pushing the RUMI cephalad, with countertraction provided by the second tenaculum.I then inflated the uterine balloon with 5 cc of water, and then inserted a three-way Foley catheter for bladder drainage. Later during the case the pneumo-occluder was instilled with 60 cc of saline .   The abdomen was previously prepped and draped in the usual sterile fashion. A small subumbilical incision was made and a 10/11 mm Optiview trocar was introduced into the abdominal cavity. A pneumoperitoneum was established with 2 to 3 L of CO2. The patient was then placed in Trendelenburg position. Two additional ports were introduced under laparoscopic guidance with 5 mm trochars at the patient's right and left lower abdomen respectively.  To ensure that I had placed the colpotomy system correctly, I anteflexed and retroflexed the RUMI tip and palpated the rim of the cup with a grasper, visually identifying the bulge.With the use of the harmonic scalpel I first coaptated and transected the tubo-ovarian pedicle. I then coaptated and transected round ligament, and opened the parametrium bilaterally. To highlight the anterior fornix, I pushed the cup up firmly. At the level of the fornix, I incised the uterovesical peritoneum and dissected the bladder down to expose 1 cm to 2 cm of the anterior vagina. I then incised the anterior fornix, using the backside blade of the Harmonic Scalpel. Entry into the vagina was confirmed by visualizing the cup. Next, I extended the incision laterally, stopping short of the  uterine vessels. Bipolar dissection was used to control cuff bleeders. The RUMI uterine manipulator was pushed cephalad stretching the vagina and thus distancing the incision from the ureter.                Next, after rotating the handle of the RUMI clockwise, I anteflexed the uterus acutely. I made the posterior colpotomy incision at the rim of the cup I then moved the RUMI handle to expose the right vaginal fornix and uterine vessels, all the while maintaining good pressure on the RUMI manipulator. With the bipolar Kleppinger forceps the right uterine vessels above the cup rim were cauterized. I then applied the Harmonic Scalpel and coaptated and transecedt the uterine vein and artery. I then rotated the uterus to the right pelvis and proceeded to cauterize with the Kleppinger forcep the left uterine vessels. Following this the Harmonic Scalpel was utilized to coaptate and transect the uterine artery and vein.  To create   distance from the ureters, I pushed the cup up against the fornix, thereby lengthening the vagina and pushing the vessels upward.  I also divided  the lateral vaginal fornix on the left and right sides to complete the colpotomy incision with the backside of the Harmonic Scalpel blade. During this critical stage of the operation, pneumoperitoneum is maintained by the pneumo-occluder. once the complete colpotomy had been completed  the pneumo-occluder is deflated and a gentle pull on the RUMI handle was undertaken to deliver the uterus from the vagina and submitted for histological evaluation. Of note both fallopian tubes were excised with the harmonic scalpel and submitted also for pathological evaluation. Both ovaries were left behind in its place. The vaginal cuff was then closed using the endoscopic stitching device with 0 Vicryl suture in a figure-of-eight interrupted fashion. The pelvic cavity was then copiously irrigated with normal saline solution. Systematic inspection demonstrated good hemostasis. The appendix was visualized and appeared normal as was a smooth liver surface and a small portion of the gallbladder was seen. The pneumoperitoneum was released and the trochars were  removed.   The subumbilical fascia was closed with an interrupted suture 0 Vicryl suture. The subcutaneous tissue was reapproximated with 3-0 Vicryl suture and the skin edges were reapproximated with Dermabond glue. The right port that had to be enlarged to allow a a 10 mm endoscopic suturing device to be introduced was closed in the following fashion. The fascia was closed with a single interrupted suture 0 Vicryl suture and the skin edges were approximated with interrupted sutures of 3-0 Vicryl. The contralateral 5 mm trocar port skin edges were reapproximated in similar fashion.. A quarter percent Marcaine was infiltrated in all 3 port sites for postoperative analgesia for a total of 10 cc. The patient was reversed from Trendelenburg position was extubated and transferred to recovery room stable vital signs. Patient was to receive Toradol 30 mg IV in route to the recovery room.        ESTIMATED BLOOD LOSS: 225 cc  Intake/Output Summary (Last 24 hours) at 09/03/12 1610 Last data filed at 09/03/12 0845  Gross per 24 hour  Intake      0 ml  Output    225 ml  Net   -225 ml     BLOOD ADMINISTERED:none   LOCAL MEDICATIONS USED:  MARCAINE   0.25% subcutaneous incision ports total 10 cc  SPECIMEN:  Source of Specimen:  Uterus cervix and bilateral fallopian tubes  DISPOSITION OF SPECIMEN:  PATHOLOGY  COUNTS:  YES  PLAN OF CARE: Transfer to PACU  [facility] EAV4:09 AMTD@

## 2012-09-03 NOTE — Anesthesia Postprocedure Evaluation (Signed)
Anesthesia Post Note  [name]  Procedure(s) Performed: Procedure(s) (LRB): HYSTERECTOMY TOTAL LAPAROSCOPIC (N/A) BILATERAL SALPINGECTOMY (Bilateral)  Anesthesia type: GA  Patient location: PACU  Post pain: Pain level controlled  Post assessment: Post-op Vital signs reviewed  Last Vitals:  Filed Vitals:   09/03/12 1015  BP: 109/50  Pulse: 79  Temp:   Resp: 16    Post vital signs: Reviewed  Level of consciousness: sedated  Complications: No apparent anesthesia complications

## 2012-09-03 NOTE — Interval H&P Note (Signed)
History and Physical Interval Note:  09/03/2012 6:56 AM  Dana [name]  has presented today for surgery, with the diagnosis of dysfunctional uterine bleeding, fibroids  The various methods of treatment have been discussed with the patient and family. After consideration of risks, benefits and other options for treatment, the patient has consented to  Procedure(s) (LRB) with comments: HYSTERECTOMY TOTAL LAPAROSCOPIC (N/A) - Dr. Audie Box to assist. as a surgical intervention .  The patient's history has been reviewed, patient examined, no change in status, stable for surgery.  I have reviewed the patient's chart and labs.  Questions were answered to the patient's satisfaction.     Ok Edwards

## 2012-09-03 NOTE — Anesthesia Preprocedure Evaluation (Addendum)
Anesthesia Evaluation  Patient identified by MRN, date of birth, ID band Patient awake    Reviewed: Allergy & Precautions, H&P , Patient's Chart, lab work & pertinent test results, reviewed documented beta blocker date and time   Airway Mallampati: II TM Distance: >3 FB Neck ROM: full   Comment: Protuberant front teeth- slightly Dental No notable dental hx.    Pulmonary  breath sounds clear to auscultation  Pulmonary exam normal       Cardiovascular hypertension (Nl EKG; No CAD Sx), On Medications Rhythm:regular Rate:Normal     Neuro/Psych    GI/Hepatic   Endo/Other  Morbid obesity  Renal/GU      Musculoskeletal   Abdominal   Peds  Hematology   Anesthesia Other Findings   Reproductive/Obstetrics                          Anesthesia Physical Anesthesia Plan  ASA: III  Anesthesia Plan: General   Post-op Pain Management:    Induction: Intravenous  Airway Management Planned: Oral ETT  Additional Equipment:   Intra-op Plan:   Post-operative Plan:   Informed Consent: I have reviewed the patients History and Physical, chart, labs and discussed the procedure including the risks, benefits and alternatives for the proposed anesthesia with the patient or authorized representative who has indicated his/her understanding and acceptance.   Dental Advisory Given and Dental advisory given  Plan Discussed with: CRNA and Surgeon  Anesthesia Plan Comments: (  Discussed  general anesthesia, including possible nausea, instrumentation of airway, sore throat,pulmonary aspiration, etc. I asked if the were any outstanding questions, or  concerns before we proceeded. )        Anesthesia Quick Evaluation

## 2012-09-03 NOTE — Preoperative (Signed)
Beta Blockers   Reason not to administer Beta Blockers:Not Applicable 

## 2012-09-03 NOTE — Transfer of Care (Signed)
Immediate Anesthesia Transfer of Care Note  [name]  Procedure(s) Performed: Procedure(s) (LRB) with comments: HYSTERECTOMY TOTAL LAPAROSCOPIC (N/A)  Patient Location: PACU  Anesthesia Type:General  Level of Consciousness: awake, oriented and patient cooperative  Airway & Oxygen Therapy: Patient Spontanous Breathing and Patient connected to nasal cannula oxygen  Post-op Assessment: Report given to PACU RN and Post -op Vital signs reviewed and stable  Post vital signs: Reviewed and stable  Complications: No apparent anesthesia complications

## 2012-09-03 NOTE — H&P (View-Only) (Signed)
Dana [name] Patient is a 45-year-old was seen in the office on October 4 and prior to that September 26 see previous notes.. Patient with history of severe dysmenorrhea menorrhagia along with dyspareunia and bloating. She had an ultrasound October 4 which demonstrated the following:   Ultrasound report: Uterus measures 9.3 x 5.7 x 4.8 cm with endometrial stripe of 8.7 mm. A posterior fibroid measuring 12 x 8 mm was noted. A possible fibroid was seen in the endometrial cavity measured 9 x 6 x 8 mm ovaries otherwise appeared to be normal. No free fluid in the cul-de-sac.  On 07/17/2012 she had an endometrial biopsy and sonohysterogram.Sonohysterogram demonstrated an anterior polyp measuring 12 x 9 x 12 mm. endometrial biopsy results:Endometrium, biopsy, uterus - BENIGN POLYPOID SECRETORY PHASE ENDOMETRIUM. - BENIGN ENDOCERVICAL MUCOSA.   Patient presented to the office today for preoperative examination. She is being followed by Dr. Scott Nadell her primary physician. Patient does have history of hypertension for which she's on HCTZ 12.5 mg daily and hypercholesterolemia for which she is on Crestor 10 mg daily. When she arrived to the office today her blood pressure was 154/100 and on repeat 156/102 and she stated she did not taken her blood pressure medication today and was upset from work. She denied headaches or blurry vision.   Pertinent Gynecological History: Menses: 7-10 days Bleeding: 7-10 days Contraception: tubal ligation DES exposure: denies Blood transfusions: none Sexually transmitted diseases: no past history Previous GYN Procedures: Tubal ligation  Last mammogram: normal Date: 2013 Last pap: normal Date: 2012 OB History: G 3, P 4   Menstrual History: Menarche age: 11 Patient's last menstrual period was 08/17/2012.    Past Medical History  Diagnosis Date  . Tobacco use disorder   . Mild hypertension   . Hypercholesterolemia   . Obesity   . Breast mass, right   . DJD  (degenerative joint disease)   . Back pain with radiation   . Headache   . Anxiety   . Groin abscess     Past Surgical History  Procedure Date  . Tubal ligation   . Lumbar disc surgery   . Finger surgery     Right index-excision of mass   . Breast lumpectomy   . Foot surgery     Family History  Problem Relation Age of Onset  . Diabetes Father   . Diabetes Mother   . Prostate cancer Paternal Grandfather   . Cancer Paternal Grandfather     prostate  . Breast cancer Maternal Aunt 46    Social History:  reports that she has been smoking Cigarettes.  She has been smoking about .25 packs per day. She has never used smokeless tobacco. She reports that she drinks about 7 ounces of alcohol per week. She reports that she does not use illicit drugs.  Allergies:  Allergies  Allergen Reactions  . Penicillins     REACTION: itching and swelling     (Not in a hospital admission)  REVIEW OF SYSTEMS: A ROS was performed and pertinent positives and negatives are included in the history.  GENERAL: No fevers or chills. HEENT: No change in vision, no earache, sore throat or sinus congestion. NECK: No pain or stiffness. CARDIOVASCULAR: No chest pain or pressure. No palpitations. PULMONARY: No shortness of breath, cough or wheeze. GASTROINTESTINAL: No abdominal pain, nausea, vomiting or diarrhea, melena or bright red blood per rectum. GENITOURINARY: No urinary frequency, urgency, hesitancy or dysuria. MUSCULOSKELETAL: No joint or muscle pain, no back   pain, no recent trauma. DERMATOLOGIC: No rash, no itching, no lesions. ENDOCRINE: No polyuria, polydipsia, no heat or cold intolerance. No recent change in weight. HEMATOLOGICAL: No anemia or easy bruising or bleeding. NEUROLOGIC: No headache, seizures, numbness, tingling or weakness. PSYCHIATRIC: No depression, no loss of interest in normal activity or change in sleep pattern.     Blood pressure 154/100, last menstrual period 08/17/2012.  Physical  Exam:  HEENT:unremarkable Neck:Supple, midline, no thyroid megaly, no carotid bruits Lungs:  Clear to auscultation no rhonchi's or wheezes Heart:Regular rate and rhythm, no murmurs or gallops Breast Exam: Not examined today Abdomen: Soft nontender no rebound or guarding Pelvic:BUS within normal limits Vagina: No lesions or discharge Cervix: No lesions or discharge Uterus: Slightly anteverted normal size shape and consistency nontender and mobile Adnexa: No palpable masses or tenderness Extremities: No cords, no edema Rectal: Unremarkable   Assessment/Plan: Patient with worsening dysmenorrhea and menorrhagia along with dyspareunia scheduled to undergo total laparoscopic hysterectomy with ovarian conservation. When she presented to the office today her blood pressures found to be elevated but she is not taking her diuretic today. We're going to make arrangements for her to see her primary care physician within 24 hours for further evaluation and assessment and perhaps additional antihypertensive medication before her surgery which is scheduled for December 19. The following risk for the surgery were discussed with the patient:                        Patient was counseled as to the risk of surgery to include the following:  1. Infection (prohylactic antibiotics will be administered)  2. DVT/Pulmonary Embolism (prophylactic pneumo compression stockings will be used)  3.Trauma to internal organs requiring additional surgical procedure to repair any injury to     Internal organs requiring perhaps additional hospitalization days.  4.Hemmorhage requiring transfusion and blood products which carry risks such as  anaphylactic reaction, hepatitis and AIDS  Patient had received literature information on the procedure scheduled and all her questions were answered and accepts all risk.  FERNANDEZ,JUAN HMD5:56 PMTD@   FERNANDEZ,JUAN H 08/20/2012, 5:45 PM   

## 2012-09-04 ENCOUNTER — Encounter (HOSPITAL_COMMUNITY): Payer: Self-pay | Admitting: *Deleted

## 2012-09-04 LAB — CBC
Platelets: 259 10*3/uL (ref 150–400)
RDW: 13.3 % (ref 11.5–15.5)
WBC: 16.6 10*3/uL — ABNORMAL HIGH (ref 4.0–10.5)

## 2012-09-04 MED ORDER — METOCLOPRAMIDE HCL 10 MG PO TABS: 10.0000 mg | ORAL_TABLET | Freq: Three times a day (TID) | ORAL | Status: DC

## 2012-09-04 MED ORDER — OXYCODONE-ACETAMINOPHEN 5-325 MG PO TABS
1.0000 | ORAL_TABLET | ORAL | Status: DC | PRN
  Administered 2012-09-04: 2 via ORAL
  Filled 2012-09-04: qty 2

## 2012-09-04 MED ORDER — OXYCODONE-ACETAMINOPHEN 5-325 MG PO TABS: 1.0000 | ORAL_TABLET | ORAL | Status: DC | PRN

## 2012-09-04 MED ORDER — PNEUMOCOCCAL VAC POLYVALENT 25 MCG/0.5ML IJ INJ: 0.5000 mL | INJECTION | INTRAMUSCULAR | Status: DC

## 2012-09-04 NOTE — Discharge Summary (Signed)
Physician Discharge Summary  Patient ID: Dana [name] MRN: [id number] DOB/AGE: [birth year] 47 y.o.  Admit date: 09/03/2012 Discharge date: 09/04/2012  Admission Diagnoses: Symptomatic leiomyomatous uteri, dysmenorrhea, menorrhagia, pelvic pain  Discharge Diagnoses: Same Active Problems:  * No active hospital problems. *    Discharged Condition: good  Hospital Course: Patient was admitted to the hospital on December 19 where she underwent a total laparoscopic hysterectomy with bilateral salpingectomy. Patient did well intraoperatively with 225 cc blood loss. She had a PCA pump for the first 12 hours and it was then discontinued as was her Foley catheter. She had remained afebrile and normotensive during her hospitalization. She was tolerating regular diet this morning ambulating and had voided spontaneously and was ready to be discharged home today. Her postop hemoglobin and hematocrit: 11.4 and 35.3 respectively.   Consults: None  Significant Diagnostic Studies: None  Treatments: Total laparoscopic hysterectomy with bilateral salpingectomies  Discharge Exam: Blood pressure 109/70, pulse 71, temperature 97.9 F (36.6 C), temperature source Oral, resp. rate 18, height 5\' 4"  (1.626 m), weight 235 lb (106.595 kg), SpO2 98.00%. General appearance: alert Resp: clear to auscultation bilaterally Cardio: regular rate and rhythm, S1, S2 normal, no murmur, click, rub or gallop GI: soft, non-tender; bowel sounds normal; no masses,  no organomegaly Incision/Wound: port sites intact.  Disposition: 01-Home or Self Care  Discharge Orders    Future Appointments: Provider: Department: Dept Phone: Center:   10/07/2012 9:00 AM Michele Mcalpine, MD [facility] [phone] None   06/14/2013 2:00 PM Ok Edwards, MD [facility] [phone] [facility]     Future Orders Please Complete By Expires   Resume previous diet      Driving Restrictions      Comments:   No  driving for 1 weeks   Lifting restrictions      Comments:   No lifting for 6 weeks   Call MD for:  temperature >100.5      Call MD for:  redness, tenderness, or signs of infection (pain, swelling, bleeding, redness, odor or green/yellow discharge around incision site)      Call MD for:  severe or increased pain, loss or decreased feeling  in affected limb(s)      Discharge instructions      Comments:   General Postsurgical Instructions, Adult  You may expect to feel dizzy, weak, and drowsy for as long as 24 hours after receiving the medicine that made you sleep (anesthetic). The following information pertains to your recovery period for the first 24 hours following surgery. Do not drive a car, ride a bicycle, participate in physical activities, or take public transportation until you are done taking narcotic pain medicines or as directed by your caregiver.  Do not drink alcohol or take tranquilizers.  Do not take medicine that has not been prescribed by your caregiver.  Do not sign important papers or make important decisions while on narcotic pain medicines.  Have a responsible person with you.  CARE OF INCISION Change bandages (dressings) as directed.  Take showers instead of baths until your caregiver gives you permission to take baths. Check with your caregiver if you have tubes coming from the wound site.  Avoid heavy lifting (more than 10 pounds/4.5 kilograms), pushing, or pulling.  Avoid activities that may risk injury to your surgical site.  Only take over-the-counter or prescription medicines for pain, discomfort, or fever as directed by your caregiver. Do not take aspirin. It can make you bleed. Take medicines (  antibiotics) that kill germs as directed.  SEEK MEDICAL CARE IF: You feel sick to your stomach (nauseous).  You start to throw up (vomit).  You have trouble eating or drinking.  You have an oral temperature above 100.  You have constipation that is not helped by adjusting  diet or increasing fluid intake. Pain medicines are a common cause of constipation.  SEEK IMMEDIATE MEDICAL CARE IF: You have persistent dizziness.  You have difficulty breathing or a congested sounding (croupy) cough.  You have an oral temperature above 100, not controlled by medicine.  There is increasing pain or tenderness near or in the surgical site.  Document Released: 12/15/2000 Document Re-Released: 11/27/2009 [facility] Patient Information 2011 [place], [place].   [facility] HMD8:01 AMTD@ Postop appointment with Dr. Lily Peer 09/21/2012 at 11:40 AM.       Medication List     As of 09/04/2012  8:03 AM    TAKE these medications         ALPRAZolam 0.5 MG tablet   Commonly known as: XANAX   1/2 to 1 tablet three times daily as needed for nerves      glycopyrrolate 2 MG tablet   Commonly known as: ROBINUL   Take 1 tablet (2 mg total) by mouth 2 (two) times daily.      hydrochlorothiazide 12.5 MG capsule   Commonly known as: MICROZIDE   Take 1 capsule (12.5 mg total) by mouth daily.      lisinopril 20 MG tablet   Commonly known as: PRINIVIL,ZESTRIL   Take 1 tablet (20 mg total) by mouth daily.      metoCLOPramide 10 MG tablet   Commonly known as: REGLAN   Take 1 tablet (10 mg total) by mouth 3 (three) times daily with meals.      oxyCODONE-acetaminophen 5-325 MG per tablet   Commonly known as: PERCOCET/ROXICET   Take 1-2 tablets by mouth every 4 (four) hours as needed.      pneumococcal 23 valent vaccine 25 MCG/0.5ML injection   Commonly known as: PNU-IMMUNE   Inject 0.5 mLs into the muscle tomorrow at 10 am.      rosuvastatin 10 MG tablet   Commonly known as: CRESTOR   Take 1 tablet (10 mg total) by mouth daily.         SignedOk Edwards 09/04/2012, 8:03 AM

## 2012-09-04 NOTE — Progress Notes (Signed)
Pt s teaching complete    Out in wheelchair

## 2012-09-17 ENCOUNTER — Other Ambulatory Visit: Payer: Self-pay | Admitting: Gynecology

## 2012-09-17 ENCOUNTER — Telehealth: Payer: Self-pay | Admitting: *Deleted

## 2012-09-17 ENCOUNTER — Ambulatory Visit (INDEPENDENT_AMBULATORY_CARE_PROVIDER_SITE_OTHER): Payer: 59 | Admitting: Gynecology

## 2012-09-17 ENCOUNTER — Encounter: Payer: Self-pay | Admitting: Gynecology

## 2012-09-17 VITALS — BP 146/92

## 2012-09-17 DIAGNOSIS — Z9889 Other specified postprocedural states: Secondary | ICD-10-CM

## 2012-09-17 DIAGNOSIS — R3915 Urgency of urination: Secondary | ICD-10-CM

## 2012-09-17 LAB — URINALYSIS W MICROSCOPIC + REFLEX CULTURE
Nitrite: NEGATIVE
Specific Gravity, Urine: 1.02 (ref 1.005–1.030)
Urobilinogen, UA: 0.2 mg/dL (ref 0.0–1.0)

## 2012-09-17 MED ORDER — HYDROCODONE-ACETAMINOPHEN 5-500 MG PO TABS: 1.0000 | ORAL_TABLET | Freq: Four times a day (QID) | ORAL | Status: DC | PRN

## 2012-09-17 NOTE — Telephone Encounter (Signed)
Rx called in to pharmacy and patient informed.  I did ask her how she was doing and she said she was okay except having pain when she urinates.  I recommended she come for office visit today and let us check on that.  Appt scheduled for 3:00pm with Dr. Glenetta Hew.

## 2012-09-17 NOTE — Telephone Encounter (Signed)
Pt had  TLH on 09/03/12 she ran out of her pain medication this am and requesting another Rx for medication. Please advise

## 2012-09-17 NOTE — Telephone Encounter (Signed)
Please call in prescription for Lortab 5/500 to take one by mouth every 4-6 hours. #[phone]

## 2012-09-17 NOTE — Progress Notes (Signed)
Patient presented to the office today for her two-week postop visit. Patient status post total laparoscopic hysterectomy with bilateral salpingectomy as a result of dysfunction uterine bleeding and leiomyomatous uteri. She is doing well with the exception she feels pressure at times when voiding is having normal bowel movements. Her urinalysis today was negative and we'll run a urine culture today as well. Pathology report was as follows:  Diagnosis Uterus and bilateral fallopian tubes UTERINE CERVIX: - BENIGN TRANSFORMATION ZONE MUCOSA WITH CHRONIC CERVICITIS AND BENIGN ENDOCERVICAL POLYP FORMATION. - NO ATYPIA OR MALIGNANCY IDENTIFIED. UTERINE CORPUS: - BENIGN PROLIFERATIVE PHASE ENDOMETRIUM. - SUPERFICIAL LEIOMYOMA PRESENT. - ADENOMYOSIS IDENTIFIED. - NO ATYPIA, HYPERPLASIA OR MALIGNANCY PRESENT. RIGHT FALLOPIAN TUBE: - BENIGN FALLOPIAN TUBE TISSUE. - NO TUMOR SEEN. LEFT FALLOPIAN TUBE: - BENIGN FALLOPIAN TUBE TISSUE (PROXIMAL STUMP). - NO TUMOR SEEN.  Exam: Abdominal port sites intact sutures removed. Abdomen soft nontender no rebound or guarding Pelvic: Bartholin urethra Skene was within normal limits Vagina: No lesions or discharge Bimanual exam no palpable masses or tenderness Rectal exam not done  Assessment/plan: Patient 2 weeks status post total laparoscopic hysterectomy bilateral salpingectomy secondary to dysmenorrhea menorrhagia and leiomyomatous uteri doing well. We will check her urine culture. I've given her  a sample of Uribell to take 1 by mouth 4 times a day for the next 2 days. She was reassured will continue to monitor symptoms otherwise we will see her back in 4 weeks for final postop visit. Her blood pressure was slightly elevated. She's going to continue to monitor that she continues elevated she will followup with her internist. She did state that she took her lisinopril and hydrochlorothiazide today.

## 2012-09-19 LAB — URINE CULTURE: Colony Count: >=100000

## 2012-09-21 ENCOUNTER — Ambulatory Visit: Payer: 59 | Admitting: Gynecology

## 2012-09-23 ENCOUNTER — Telehealth: Payer: Self-pay | Admitting: Gynecology

## 2012-09-23 ENCOUNTER — Other Ambulatory Visit: Payer: Self-pay | Admitting: Gynecology

## 2012-09-23 MED ORDER — PHENAZOPYRIDINE HCL 200 MG PO TABS: 200.0000 mg | ORAL_TABLET | Freq: Three times a day (TID) | ORAL | Status: DC | PRN

## 2012-09-23 MED ORDER — SULFAMETHOXAZOLE-TMP DS 800-160 MG PO TABS: 1.0000 | ORAL_TABLET | Freq: Two times a day (BID) | ORAL | Status: DC

## 2012-09-23 NOTE — Telephone Encounter (Signed)
Patient called the office today that she was having some suprapubic discomfort like in itching sensation. She was seen the office 3 days ago for a two-week postop visit after total laparoscopic hysterectomy and had a benign exam. I've given her sample Uribell to take 1 tablet 4 times a day for 2 days as we wait for the urine culture. Patient denied any fever chills nausea or vomiting some urinary frequency no unusual vaginal bleeding. Her urine culture demonstrated the following:  >=100,000 COLONIES/ML Organism ID, Bacteria Multiple bacterial morphotypes present, none Organism ID, Bacteria predominant. Suggest appropriate recollection if Organism ID, Bacteria clinically indicated.  I'm going to call in a prescription for Bactrim DS to take 1 by mouth twice a day for 7 days and a prescription for Pyridium 200 mg one by mouth 3 times a day for the next 2 days. We'll have patient return to the office in one week for followup urinalysis. If her symptoms change she was instructed to report to the office.

## 2012-10-07 ENCOUNTER — Ambulatory Visit: Payer: 59 | Admitting: Pulmonary Disease

## 2012-10-15 ENCOUNTER — Ambulatory Visit (INDEPENDENT_AMBULATORY_CARE_PROVIDER_SITE_OTHER): Payer: 59

## 2012-10-15 ENCOUNTER — Telehealth: Payer: Self-pay | Admitting: *Deleted

## 2012-10-15 ENCOUNTER — Encounter: Payer: Self-pay | Admitting: Gynecology

## 2012-10-15 ENCOUNTER — Ambulatory Visit (INDEPENDENT_AMBULATORY_CARE_PROVIDER_SITE_OTHER): Payer: 59 | Admitting: Gynecology

## 2012-10-15 VITALS — BP 136/88

## 2012-10-15 DIAGNOSIS — G8918 Other acute postprocedural pain: Secondary | ICD-10-CM

## 2012-10-15 DIAGNOSIS — N949 Unspecified condition associated with female genital organs and menstrual cycle: Secondary | ICD-10-CM

## 2012-10-15 DIAGNOSIS — N390 Urinary tract infection, site not specified: Secondary | ICD-10-CM

## 2012-10-15 DIAGNOSIS — R102 Pelvic and perineal pain: Secondary | ICD-10-CM

## 2012-10-15 LAB — URINALYSIS W MICROSCOPIC + REFLEX CULTURE
Bilirubin Urine: NEGATIVE
Glucose, UA: NEGATIVE mg/dL
Hgb urine dipstick: NEGATIVE
Protein, ur: NEGATIVE mg/dL
RBC / HPF: NONE SEEN RBC/hpf (ref <3)

## 2012-10-15 MED ORDER — NITROFURANTOIN MONOHYD MACRO 100 MG PO CAPS: 100.0000 mg | ORAL_CAPSULE | Freq: Two times a day (BID) | ORAL | Status: DC

## 2012-10-15 MED ORDER — HYDROCODONE-ACETAMINOPHEN 5-500 MG PO TABS: 1.0000 | ORAL_TABLET | Freq: Four times a day (QID) | ORAL | Status: DC | PRN

## 2012-10-15 NOTE — Progress Notes (Signed)
Patient presented to the office today for 6 weeks postop visit.Patient status post total laparoscopic hysterectomy with bilateral salpingectomy as a result of dysfunction uterine bleeding and leiomyomatous uteri. Pathology report was benign. Patient has been complaining of lower, we'll discomfort. Also some urinary frequency but no douche or rib per se. Patient denies fever chills nausea or vomiting. She's having normal bowel movements.  Exam: Abdomen slightly tender but no rebound or guarding Pelvic: Bartholin urethra Skene was within normal limits Vagina: No lesion discharge Vaginal cuff intact A manual examination no palpable masses although some tenderness described suprapubically. Port sites intact completely healed.  An ultrasound was ordered today which demonstrated the following: Right left ovary were normal. No fluid in the cul-de-sac. No masses were noted. Otherwise normal ultrasound.  Urinalysis demonstrated 7-10 WBC a few bacteria urine culture submitted.  Assessment/plan: Patient requesting a couple extra weeks to recover from her surgery as well as pain medication. She will be prescribed Lortab 5-501 by mouth every 4-6 hours when necessary #30 with no refills. She will be treated for an early suspected urinary tract infection with Macrobid one by mouth twice a day for 7 days. She was provided with a note for work to extend her rest for 2 more weeks before she returns to full duty.

## 2012-10-15 NOTE — Patient Instructions (Addendum)
Urinary Tract Infection Urinary tract infections (UTIs) can develop anywhere along your urinary tract. Your urinary tract is your body's drainage system for removing wastes and extra water. Your urinary tract includes two kidneys, two ureters, a bladder, and a urethra. Your kidneys are a pair of bean-shaped organs. Each kidney is about the size of your fist. They are located below your ribs, one on each side of your spine. CAUSES Infections are caused by microbes, which are microscopic organisms, including fungi, viruses, and bacteria. These organisms are so small that they can only be seen through a microscope. Bacteria are the microbes that most commonly cause UTIs. SYMPTOMS  Symptoms of UTIs may vary by age and gender of the patient and by the location of the infection. Symptoms in young women typically include a frequent and intense urge to urinate and a painful, burning feeling in the bladder or urethra during urination. Older women and men are more likely to be tired, shaky, and weak and have muscle aches and abdominal pain. A fever may mean the infection is in your kidneys. Other symptoms of a kidney infection include pain in your back or sides below the ribs, nausea, and vomiting. DIAGNOSIS To diagnose a UTI, your caregiver will ask you about your symptoms. Your caregiver also will ask to provide a urine sample. The urine sample will be tested for bacteria and white blood cells. White blood cells are made by your body to help fight infection. TREATMENT  Typically, UTIs can be treated with medication. Because most UTIs are caused by a bacterial infection, they usually can be treated with the use of antibiotics. The choice of antibiotic and length of treatment depend on your symptoms and the type of bacteria causing your infection. HOME CARE INSTRUCTIONS  If you were prescribed antibiotics, take them exactly as your caregiver instructs you. Finish the medication even if you feel better after you  have only taken some of the medication.  Drink enough water and fluids to keep your urine clear or pale yellow.  Avoid caffeine, tea, and carbonated beverages. They tend to irritate your bladder.  Empty your bladder often. Avoid holding urine for long periods of time.  Empty your bladder before and after sexual intercourse.  After a bowel movement, women should cleanse from front to back. Use each tissue only once. SEEK MEDICAL CARE IF:   You have back pain.  You develop a fever.  Your symptoms do not begin to resolve within 3 days. SEEK IMMEDIATE MEDICAL CARE IF:   You have severe back pain or lower abdominal pain.  You develop chills.  You have nausea or vomiting.  You have continued burning or discomfort with urination. MAKE SURE YOU:   Understand these instructions.  Will watch your condition.  Will get help right away if you are not doing well or get worse. Document Released: 06/12/2005 Document Revised: 03/03/2012 Document Reviewed: 10/11/2011 [employer] Patient Information 2013 [employer].  

## 2012-10-15 NOTE — Telephone Encounter (Signed)
Rx called in with same direction of 5/500. Pt informed.

## 2012-10-15 NOTE — Telephone Encounter (Signed)
OK to switch 

## 2012-10-15 NOTE — Telephone Encounter (Signed)
Pt was given Lortab 5/500 Rx today, but this medication has been D/C'd they have 5/325 okay to switch? Same directions? Please advise

## 2012-10-16 LAB — URINE CULTURE: Colony Count: 30000

## 2012-10-19 ENCOUNTER — Telehealth: Payer: Self-pay

## 2012-10-19 NOTE — Telephone Encounter (Signed)
Patient was in Thursday, 10/15/12 for post op visit and you gave her a note to take two more weeks out of work for recovery. Patient states she does a lot of lifting at her job.   Patient is requesting to return on 11/02/12 which would be 2 weeks 4 days from office visit and would put her returning on a Monday.  I will write a letter to employer it okayed by you.

## 2012-10-19 NOTE — Telephone Encounter (Signed)
Sure. Thank you!

## 2012-10-19 NOTE — Telephone Encounter (Signed)
Patient informed. Letter written in Epic system and to be faxed to employer when she calls me with fax number tomorrow.

## 2012-10-20 ENCOUNTER — Telehealth: Payer: Self-pay | Admitting: Pulmonary Disease

## 2012-10-20 MED ORDER — LEVOFLOXACIN 500 MG PO TABS: 500.0000 mg | ORAL_TABLET | Freq: Every day | ORAL | Status: DC

## 2012-10-20 NOTE — Telephone Encounter (Signed)
Per SN---ok to send in levaquin 500 mg  #7  1 daily.  Take the align once daily while on the abx.   Called and spoke with pt and she is aware of SN recs.  Nothing further is needed.

## 2012-10-20 NOTE — Telephone Encounter (Signed)
Spoke to pt, she states she has had congestion in her chest, sore throat, hoarseness and cough with production of light gray mucus for 3 days.   She is requesting to have an antibiotic called in.  Please advise.

## 2012-11-02 ENCOUNTER — Telehealth: Payer: Self-pay

## 2012-11-02 MED ORDER — HYDROCODONE-ACETAMINOPHEN 5-325 MG PO TABS: 1.0000 | ORAL_TABLET | Freq: Four times a day (QID) | ORAL | Status: DC | PRN

## 2012-11-02 NOTE — Telephone Encounter (Signed)
I spoke with Dr. Glenetta Hew in the hall as he was leaving and relayed all of the info below to him.  He said he would also like patient to have a pelvic u/s tomorrow at office visit.  Ultrasound is scheduled for 11:00am and she will see Dr. Glenetta Hew after at 12:20pm.  Patient was informed that we are fitting her in and she may wait and she is fine with that.  Per Dr. Manuela Schwartz okay Lortab 5/325 was called in to her [facility] for #10 to be taken one q6h.

## 2012-11-02 NOTE — Telephone Encounter (Signed)
Patient called.  She went back to work today and said it "was the worst day of her life".  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  She said she thinks it is just not time for her to go back to work.  I recommended that I cannot extend her disability and if she is hurting to where she thinks she cannot work she really needs to come in in the morning and let Dr. Glenetta Hew check her and let him decide if extending disability is appropriate. Appt scheduled for morning.

## 2012-11-02 NOTE — Telephone Encounter (Signed)
See note below. I called patient back to make sure she felt like she could wait for morning. She said since off her feet and home the pain and pressure has settled down. She said she took her last pain pill when she got home. I asked her if she might need something for pain tonight and she would like that.  I explained Percocet written only and she is fine with something I could call in. Please advise.

## 2012-11-03 ENCOUNTER — Ambulatory Visit (INDEPENDENT_AMBULATORY_CARE_PROVIDER_SITE_OTHER): Payer: 59 | Admitting: Gynecology

## 2012-11-03 ENCOUNTER — Encounter: Payer: Self-pay | Admitting: Gynecology

## 2012-11-03 ENCOUNTER — Other Ambulatory Visit: Payer: Self-pay | Admitting: Gynecology

## 2012-11-03 ENCOUNTER — Ambulatory Visit: Payer: 59 | Admitting: Gynecology

## 2012-11-03 ENCOUNTER — Ambulatory Visit: Payer: 59

## 2012-11-03 VITALS — BP 130/88

## 2012-11-03 DIAGNOSIS — R102 Pelvic and perineal pain: Secondary | ICD-10-CM

## 2012-11-03 DIAGNOSIS — N949 Unspecified condition associated with female genital organs and menstrual cycle: Secondary | ICD-10-CM

## 2012-11-03 LAB — CBC WITH DIFFERENTIAL/PLATELET
Eosinophils Absolute: 0.2 10*3/uL (ref 0.0–0.7)
Eosinophils Relative: 2 % (ref 0–5)
HCT: 37.4 % (ref 36.0–46.0)
Lymphs Abs: 2.3 10*3/uL (ref 0.7–4.0)
MCH: 28.6 pg (ref 26.0–34.0)
MCV: 87 fL (ref 78.0–100.0)
Monocytes Absolute: 0.9 10*3/uL (ref 0.1–1.0)
Monocytes Relative: 10 % (ref 3–12)
Platelets: 324 10*3/uL (ref 150–400)
RBC: 4.3 MIL/uL (ref 3.87–5.11)

## 2012-11-03 MED ORDER — CLINDAMYCIN PHOSPHATE 2 % VA CREA: 1.0000 | TOPICAL_CREAM | Freq: Every day | VAGINAL | Status: DC

## 2012-11-03 NOTE — Progress Notes (Signed)
Patient presented to the office today stating that she went back to work yesterday and after 4 hours of working after lunch break she began to have low abdominal discomfort and stated that she could not do her work. She went to work this morning for a few hours and had discomfort as well. Patient is now 6 weeks status post total laparoscopic hysterectomy with bilateral salpingectomy as a result of dysfunctional uterine bleeding and leiomyomatous uteri. Her pathology report was benign. She was seen in the office on January 30 and was given 2 weeks extension she stated she was not ready to go back to work. She was also given an additional prescription of Lortab 5-501 by mouth every 4-6 hours when necessary. For suspected early urinary tract infection she had been given a prescription of Macrobid one by mouth twice a day for 7 days. A note was provided to extend her out of work for 2 more weeks.  She denied any fever chills nausea vomiting no GU or GI complaints. She was fine at home until she returned to work. She stated that she did have intercourse for one day and there was no major issues.  Exam: Back: No CVA tenderness Abdomen: Soft nontender no rebound or guarding port sites completely healed, positive bowel sounds all 4 quadrants Pelvic: Bartholin urethra Skene was within normal limits Vagina: Vaginal cuff intact Bimanual exam no palpable masses or tenderness Rectal exam: Not done  Urinalysis: Negative  Assessment/plan:patient status post total laparoscopic hysterectomy on December 19 with no complications. Patient was seen at 6 weeks post op doing well but did not feel she was ready to go back to work and was given an extension for 2 weeks. 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 Her examination was totally negative. We will check her CBC today. I have given her a note to modify her work whereby she would  only work half a day for the next 2 weeks and no lifting or straining at work during this time.patient otherwise scheduled to return to the office for her annual exam next year or when necessary.

## 2012-11-04 LAB — URINALYSIS W MICROSCOPIC + REFLEX CULTURE
Bacteria, UA: NONE SEEN
Bilirubin Urine: NEGATIVE
Casts: NONE SEEN
Crystals: NONE SEEN
Glucose, UA: NEGATIVE mg/dL
Hgb urine dipstick: NEGATIVE
Ketones, ur: NEGATIVE mg/dL
pH: 6 (ref 5.0–8.0)

## 2012-11-17 ENCOUNTER — Ambulatory Visit: Payer: 59 | Admitting: Gynecology

## 2012-11-26 ENCOUNTER — Telehealth: Payer: Self-pay | Admitting: Gynecology

## 2012-11-26 ENCOUNTER — Ambulatory Visit: Payer: 59 | Admitting: Gynecology

## 2012-11-26 ENCOUNTER — Encounter: Payer: Self-pay | Admitting: Gynecology

## 2012-11-26 VITALS — BP 148/90

## 2012-11-26 DIAGNOSIS — N949 Unspecified condition associated with female genital organs and menstrual cycle: Secondary | ICD-10-CM

## 2012-11-26 LAB — URINALYSIS W MICROSCOPIC + REFLEX CULTURE
Hgb urine dipstick: NEGATIVE
Ketones, ur: NEGATIVE mg/dL
Leukocytes, UA: NEGATIVE
Nitrite: NEGATIVE
Protein, ur: NEGATIVE mg/dL

## 2012-11-26 MED ORDER — IBUPROFEN 800 MG PO TABS: 800.0000 mg | ORAL_TABLET | Freq: Three times a day (TID) | ORAL | Status: DC | PRN

## 2012-11-26 NOTE — Telephone Encounter (Signed)
On Call note:  Requesting pain medication.  Saw Dr Lily Peer today.  Had hysterectomy mid December and still with some pain and forgot to ask Dr Lily Peer for pain medication prescription today .  Had been taking narcotics.  Recommended Ibuprofen 800 mg Q 8 hours #30 with one refill.  Follow up in office in not helping.

## 2012-11-27 NOTE — Progress Notes (Addendum)
Patient presented to the office today complaining  Once again of low abdominal discomfort especially after working 9 hours a day and standing and lifting.Patient is  2 and a half months  status post total laparoscopic hysterectomy with bilateral salpingectomy as a result of dysfunctional uterine bleeding and leiomyomatous uteri. Her pathology report was benign. She was seen in the office on January 30 and was given 2 weeks extension she stated she was not ready to go back to work. She was also given an additional prescription of Lortab 5-500  by mouth every 4-6 hours when necessary. For suspected early urinary tract infection she had been given a prescription of Macrobid one by mouth twice a day for 7 days. A note was provided to extend her out of work for 2 more weeks. She was seen in the office on Feb. 18th with similar complaint and had a benign exam. Her CBC was normal and an ultrasound done January 31 was normal with the exception of absent uterus as result of hysterectomy. She was given a note for work so that she may work half day and no lifting. She has noticed her symptoms when she is doing strenuous work and on her feet. Patient does suffer from IBS and had seen Dr. Leone Payor in the past. No GU complaints only occasional constipation.  Exam: Abdomen: soft non tender no rebound or guarding. Laparoscopic ports completely healed. Back: No CVA tenderness Pelvic:BUS WNL Vagina: No lesions or discharge. Bimanual exam: No masses or tenderness Rectal: No masses or tenderness Leg raises did not elicit discomfort Neg Rovsing and Obturator signs  A/P: Non specific low abdominal pains on this patient who is s/p Laparoscopic  Hysterectomy almost three months ago. We discussed stretching exercises in the event of adhesions. I have recommend she follow up with Dr. Leone Payor her GI due to her history of IBS. She was given a not for her work for light duty for the next month.She may need to changer her job di  scription to another area where she would not have to be doing lifting. If after another month symptoms continue we will order a CT of the abdomen and pelvis.U/A was negative today.

## 2012-12-16 ENCOUNTER — Other Ambulatory Visit: Payer: Self-pay

## 2012-12-16 DIAGNOSIS — Z1231 Encounter for screening mammogram for malignant neoplasm of breast: Secondary | ICD-10-CM

## 2013-01-08 ENCOUNTER — Ambulatory Visit
Admission: RE | Admit: 2013-01-08 | Discharge: 2013-01-08 | Disposition: A | Discharge status: Home / Self Care | Payer: 59 | Source: Ambulatory Visit

## 2013-01-08 DIAGNOSIS — Z1231 Encounter for screening mammogram for malignant neoplasm of breast: Secondary | ICD-10-CM

## 2013-02-03 ENCOUNTER — Telehealth: Payer: Self-pay | Admitting: Pulmonary Disease

## 2013-02-03 MED ORDER — LEVOFLOXACIN 500 MG PO TABS: 500.0000 mg | ORAL_TABLET | Freq: Every day | ORAL | Status: DC

## 2013-02-03 NOTE — Telephone Encounter (Signed)
Pt aware of recs. rx has been sent 

## 2013-02-03 NOTE — Telephone Encounter (Signed)
Per SN---  levaquin 500 mg  #7  1 daily mucinex otc 600 mg  2 po bid Increase fluids

## 2013-02-03 NOTE — Telephone Encounter (Signed)
I spoke with pt. She c/o cough w/ [place] phlem (today it had streak of blood in the phlem), HA, ear ache, congestion, sore throat, feels hot but not sure if she is runing fever x Monday. No wheezing, no chest tx. She did have some nausea and vomiting yesterday. She is taking OTC delsym and alka seltzer plus. Pt went to work today but they sent her back home. Please advise SN thanks Last OV 09/03/12 No pending appt Allergies  Allergen Reactions  . Penicillins Itching and Swelling

## 2013-02-03 NOTE — Telephone Encounter (Signed)
Pt called back again re: same. Wants a call back asap "feels lousy". Dana [name]

## 2013-06-14 ENCOUNTER — Encounter: Payer: Self-pay | Admitting: Gynecology

## 2013-06-25 ENCOUNTER — Other Ambulatory Visit: Payer: Self-pay | Admitting: Gynecology

## 2013-09-13 ENCOUNTER — Encounter: Payer: Self-pay | Admitting: Pulmonary Disease

## 2013-09-13 ENCOUNTER — Other Ambulatory Visit (INDEPENDENT_AMBULATORY_CARE_PROVIDER_SITE_OTHER): Payer: 59

## 2013-09-13 ENCOUNTER — Ambulatory Visit (INDEPENDENT_AMBULATORY_CARE_PROVIDER_SITE_OTHER): Payer: 59 | Admitting: Pulmonary Disease

## 2013-09-13 VITALS — BP 130/98 | HR 71 | Temp 97.8°F | Ht 62.0 in | Wt 248.8 lb

## 2013-09-13 DIAGNOSIS — K589 Irritable bowel syndrome without diarrhea: Secondary | ICD-10-CM

## 2013-09-13 DIAGNOSIS — I10 Essential (primary) hypertension: Secondary | ICD-10-CM

## 2013-09-13 DIAGNOSIS — Z0000 Encounter for general adult medical examination without abnormal findings: Secondary | ICD-10-CM

## 2013-09-13 DIAGNOSIS — E7800 Pure hypercholesterolemia, unspecified: Secondary | ICD-10-CM

## 2013-09-13 DIAGNOSIS — E669 Obesity, unspecified: Secondary | ICD-10-CM

## 2013-09-13 DIAGNOSIS — F411 Generalized anxiety disorder: Secondary | ICD-10-CM

## 2013-09-13 LAB — LIPID PANEL
Cholesterol: 261 mg/dL — ABNORMAL HIGH (ref 0–200)
HDL: 67.2 mg/dL (ref >39.00)
Total CHOL/HDL Ratio: 4
VLDL: 12 mg/dL (ref 0.0–40.0)

## 2013-09-13 LAB — CBC WITH DIFFERENTIAL/PLATELET
Basophils Relative: 0.6 % (ref 0.0–3.0)
Eosinophils Absolute: 0.1 10*3/uL (ref 0.0–0.7)
Eosinophils Relative: 1.1 % (ref 0.0–5.0)
HCT: 39.9 % (ref 36.0–46.0)
Hemoglobin: 13.2 g/dL (ref 12.0–15.0)
Lymphs Abs: 2.1 10*3/uL (ref 0.7–4.0)
Monocytes Relative: 8.2 % (ref 3.0–12.0)
Neutro Abs: 4.9 10*3/uL (ref 1.4–7.7)
RBC: 4.59 Mil/uL (ref 3.87–5.11)
WBC: 7.7 10*3/uL (ref 4.5–10.5)

## 2013-09-13 LAB — HEPATIC FUNCTION PANEL
AST: 21 U/L (ref 0–37)
Alkaline Phosphatase: 69 U/L (ref 39–117)
Bilirubin, Direct: 0.1 mg/dL (ref 0.0–0.3)
Total Bilirubin: 0.6 mg/dL (ref 0.3–1.2)
Total Protein: 7.1 g/dL (ref 6.0–8.3)

## 2013-09-13 LAB — BASIC METABOLIC PANEL
BUN: 13 mg/dL (ref 6–23)
Chloride: 106 mEq/L (ref 96–112)
Creatinine, Ser: 0.8 mg/dL (ref 0.4–1.2)
GFR: 104.45 mL/min (ref >60.00)
Potassium: 4 mEq/L (ref 3.5–5.1)

## 2013-09-13 LAB — TSH: TSH: 1.57 u[IU]/mL (ref 0.35–5.50)

## 2013-09-13 LAB — LDL CHOLESTEROL, DIRECT: Direct LDL: 197.7 mg/dL

## 2013-09-13 MED ORDER — LISINOPRIL 20 MG PO TABS: 20.0000 mg | ORAL_TABLET | Freq: Every day | ORAL | Status: DC

## 2013-09-13 MED ORDER — ROSUVASTATIN CALCIUM 10 MG PO TABS: 10.0000 mg | ORAL_TABLET | Freq: Every day | ORAL | Status: DC

## 2013-09-13 MED ORDER — PANTOPRAZOLE SODIUM 40 MG PO TBEC: 40.0000 mg | DELAYED_RELEASE_TABLET | Freq: Every day | ORAL | Status: DC

## 2013-09-13 MED ORDER — ALPRAZOLAM 0.5 MG PO TABS: ORAL_TABLET | ORAL | Status: DC

## 2013-09-13 MED ORDER — HYDROCHLOROTHIAZIDE 12.5 MG PO CAPS: ORAL_CAPSULE | ORAL | Status: DC

## 2013-09-13 NOTE — Progress Notes (Signed)
Subjective:    Patient ID: Dana [name], female    DOB: Apr 20, 1966, 47 y.o.   MRN: [id number]  HPI 47 y/o BF here for a follow up visit...  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  ~  March 17, 2012:  41mo ROV & CPX> Adisyn stopped her Prav40 last yr due to upset stomach (sounded more like IBS) & didn't call to notify us or deal w/ the side effect, she has been off it for many months & her FLP is reranged at baseline w/ LDL=154; we discussed options & she agreed to try LOW DOSE Crestor5mg /d...  She wants refills of her Xanax & Vicodin please...    We reviewed prob list, meds, xrays and labs> see below for updates>> CXR 7/13 showed heart at upper lim norm, lungs clear, DJD in spine, NAD.Marland KitchenMarland Kitchen EKG 7/13 showed NSR, rate81, WNL, NAD... LABS 7/13:  FLP- not at goal & Cres5 started;  Chems- wnl;  CBC- wnl;  TSH=1.30  ~  July 16, 2012:  74mo ROV & Dana [name] has a hysterectomy sched for 09/03/12 by DrFernandez> Epic records reveals dysmenorrhea & menorrhagia, fibroids & left ovarian mass seen on prev ultrasound;  Her BP was noted to be in the 140/90 range at DrFernandez office & he started her on HCTZ 12.5mg  Sep2013;  Today she is c/o dysuria- pain, pressure x2-3d & we checked UA, C&S, started empiric Rx w/ Cipro250Bid...    Smoker> she has decreased smoking to 0-1cig/d "when I'm nervous" but doesn't buy them anymore; last CXR 7/13 was  clear; she is encouraged to quit completely...    HBP> on HCT 12.5mg /d now + low sodium diet, weight control, etc; BP= 126/82 today & we reviewed the assoc betw her weight & BP- encouraged taking med everyday, + diet & exercise...    Chol> on Cres5 started 7/13; f/u FLP showed TChol 221, TG 64, HDL 55, LDL 162; rec to incr Cres10...    Obesity> weight=244# (up 2#) & BMI=44; we reviewed diet, exercise, & the option for Bariatic surg...    GI eval 8/13 by DrGessner w/ EGD was normal, Colonoscopy was also WNL- no lesion ident & random Bxs= benign colonic mucosa;  She had been c/o n/v, abd pain & diarrhea- frequently post prandial; tried Bentyl20 prn w/o help, changed to Zofran & Glycopyrrolate 2mg Bid...    GU> r/o UTI w/ her acute symtoms today; UA showed few wbc & bact- We decided to treat w/ Cipro250Bid pending culture data...    GYN> per DrFernandez & notes reviewed...    DJD/ LBP> on Mobic7.5 & Vicodin prn; she has seen DrDean & DrNitka in the past w/ L2-3 microdiskectomy 4/09...    Anxiety> on Alprazolam 0.5mg  prn We reviewed prob list, meds, xrays and labs> see below for updates >>  Addendum>> Urine cult ret  neg, no growth...   ~  September 13, 2013:  24mo ROV & CPX> Dana [name] returns for CPX but has run out of all her meds;  States she is still having GI issues & rec to f/u w/ drGessner at her convenience;  We reviewed the following medical problems during today's office visit >>     Smoker> still smoking a few cigs daily; advised to quit completely; last CXR 7/13 showed heart at upper lim of norm, clear lungs, NAD...     HBP> on Lisin20 & HCT 12.5mg /d + low sodium diet, weight control, etc; ran out of all her meds- BP= 130/98 today & we reviewed the assoc betw her weight/ BP/ & med compliance- encouraged taking med everyday, + diet & exercise...    Chol> on Cres10 but ran out; FLP 12/14 on diet alone showed TChol 261, TG 60, HDL 67, LDL 198; Rec to switch to [id number] & take it every day!    Obesity>  weight=249# (up 5#) & BMI=44; we reviewed diet, exercise, & the option for Bariatic surg...    GI eval 8/13 by DrGessner, IBS> EGD was normal, Colonoscopy was also WNL- no lesion ident & random Bxs= benign colonic mucosa;  She had been c/o n/v, abd pain & diarrhea- frequently post prandial; tried Bentyl20 prn w/o help, changed to Zofran & Glycopyrrolate 2mg Bid; she has persist symptoms and requests f/u GI appt... rec to add Protonix40 for reflux symptoms.    GU> hx UTIs- no recent symptoms...    GYN> per DrFernandez & notes reviewed; s/p Hyst 12/13 (for DUB & fibroids) & didn't ret to work until 3/14...    DJD/ LBP> on Vicodin prn; she has seen DrDean & DrNitka in the past w/ L2-3 microdiskectomy 4/09; states she hurt her back at work after her ret 3/14- [employer] & worker's comp- "it was musc spasm, I'm getting therapy"...    Anxiety> on Alprazolam 0.5mg  prn We reviewed prob list, meds, xrays and labs> see below for updates >>  LABS 12/14:  FLP- not at goals on diet alone, LDL=198;  Chems- wnl;  CBC- wnl...          Problem List:  CIGARETTE SMOKER (ICD-305.1) - still smokes several cigarettes per day (~1/4ppd she says)... we discussed smoking cessation strategies and offered counselling, patches, Chantix, etc... she declines smoking cessation help & does not appear motivated to quit... She denies cough, sputum, hemoptysis, SOB, etc... ~  CXR 4/12 showed clear lungs, WNL.[name]. ~  7/13:  We reviewed smoking cessation strategies; CXR 7/13 showed heart at upper lim norm, clear lungs, DJD spine... ~  10/13:  P[t indicates that she has decr to 0-1 cig/d & doesn't buy them anymore... ~  12/14: still smoking a few cigs daily; advised to quit completely; last CXR 7/13 showed heart at upper lim of norm, clear lungs, NAD  HYPERTENSION, MILD (ICD-401.1) - borderline HBP- ?related to her weight... we prev prescribed LisinoprilL/Hct Aug08 but she didn't follow up and stopped this on her own... she prefers to  treat this without meds & we discussed diet + exercise needed...   ~  4/11:  BP=130/90> denies HA, fatigue, visual changes, CP, palipit, dizziness, syncope, dyspnea, edema, etc; pt advised to get a BP cuff and start monitoring BP at home, get on diet- 2 gm sodium, 1200 cal/d weight reducing diet, and incease exercise program... ~  4/12:  BP still ~128/82 despite 12# wt gain over the last yr... she declines medRx. ~  7/13:  BP= 136/74 & she is reminded to get wt down, avoid salt, etc... EKG showed NSR, rate81, wnl, NAD... ~  9/13:  BP was ~140/90 at DrFernandez office & he started HCT 12.5mg /d... ~  10/13:  BP= 126/82 & she remains asymptomatic w/o HA, visual sx, CP, palpit, SOB, edema... ~  12/14: on Lisin20 & HCT 12.5mg /d + low sodium diet, weight control, etc; ran out of all her meds- BP= 130/98 today & we reviewed the assoc betw her weight/ BP/ & med compliance- encouraged taking med everyday, + diet & exercise.   HYPERCHOLESTEROLEMIA (ICD-272.0) - she refused to fill Simva Rx in past & was rec to take Prav40 but she has refused all meds for Chol until 7/13=> started CRESTOR5. ~  FLP 8/08 showed TChol 244, TG 56, HDL 66, LDL 168- Simvastatin recommended, but pt refused. ~  FLP 3/10 showed TChol 247, TG 52, HDL 71, LDL 166... rec> try Pravastatin40 + diet & exercise. ~  FLP 4/11 off Prav40 x79mo showed TChol 240, TG 50, HDL 76, LDL158... rec> get bck on Prav40 (she never did) ~  FLP 4/12 on diet alone showed TChol 233, TG 30, HDL 72, LDL 154... She is rec to take the statin, but she prefers diet alone... ~  FLP 7/13 on "diet" showed TChol 252, TG 80, HDL 85, LDL 154.[name] She is rec to start CRESTOR 5mg /d... ~  FLP 7/13 on Cres5 showed TChol 221, TG 64, HDL 55, LDL 162... Rec to incr CRESTOR 10mg /d & take it daily. ~  FLP 12/14 off all meds showed TChol 261, TG 60, HDL 67, LDL 198; Rec to switch to [id number] & take it every day!  OBESITY (ICD-278.00) - diet/ exercise discussed w/ pt (again)... she  refuses to consider bariatric approach... in her 5\' 4"  frame her BMI has been >40.[name]. ~  weight 2/07 = 228# ~  weight 8/08 = 237# ~  weight 3/09 = 243# ~  weight 3/10 = 240# ~  weight 4/11 = 220# ~  weight 4/12 = 232# ~  Weight 7/13 = 242# ~  Weight 10/13 = 244# ~  Weight 12/14 = 249#  GYN >> DrFernandez did a Hysterectomy 12/13 (for DUB & fibroids) & didn't ret to work until 3/14...  BREAST MASS, RIGHT (ICD-611.72) - benign fibroadenoma removed by DrRosenbower 6/11... ~  4/12: she had a f/u mammogram at [facility]= neg... ~  4/13:  F/u mammogram was neg...  DEGENERATIVE JOINT DISEASE (ICD-715.90) BACK PAIN WITH RADICULOPATHY (ICD-729.2) - presented 3/09 w/ left sided back pain into left leg... eval by DrDean & DrNitka w/  L2-3 microdiscectomy 4/09 by Susann Givens... ~  Recurrent LBP treated w/ rest, ice/heat, Advil, VICODIN Tid prn... ~  12/14: on Vicodin prn; she has seen DrDean & DrNitka in the past w/ L2-3 microdiskectomy 4/09; states she hurt her back at work after her ret 3/14- [employer] rx & worker's comp- "it was musc spasm, I'm getting therapy"  Hx of HEADACHE (ICD-784.0)  ANXIETY (ICD-300.00) - on ALPRAZOLAM 0.5mg  Tid Prn...   Past Surgical History  Procedure Laterality Date  . Tubal ligation    . Lumbar disc surgery    . Finger surgery      Right index-excision of mass   . Breast lumpectomy    . Foot surgery    . Laparoscopic hysterectomy  09/03/2012    Procedure: HYSTERECTOMY TOTAL LAPAROSCOPIC;  Surgeon: Ok Edwards, MD;  Location: [facility];  Service: Gynecology;  Laterality: N/A;  .  Bilateral salpingectomy  09/03/2012    Procedure: BILATERAL SALPINGECTOMY;  Surgeon: Ok Edwards, MD;  Location: [facility];  Service: Gynecology;  Laterality: Bilateral;    Outpatient Encounter Prescriptions as of 09/13/2013  Medication Sig  . ALPRAZolam (XANAX) 0.5 MG tablet 1/2 to 1 tablet three times daily as needed for nerves  . hydrochlorothiazide (MICROZIDE) 12.5 MG capsule TAKE  ONE CAPSULE BY MOUTH EVERY DAY  . HYDROcodone-acetaminophen (NORCO/VICODIN) 5-325 MG per tablet Take 1 tablet by mouth every 6 (six) hours as needed for pain.  [name] lisinopril (PRINIVIL,ZESTRIL) 20 MG tablet Take 1 tablet (20 mg total) by mouth daily.  . rosuvastatin (CRESTOR) 10 MG tablet Take 1 tablet (10 mg total) by mouth daily.  . metoCLOPramide (REGLAN) 10 MG tablet Take 1 tablet (10 mg total) by mouth 3 (three) times daily with meals.  . [DISCONTINUED] clindamycin (CLEOCIN) 2 % vaginal cream Place 1 Applicatorful vaginally at bedtime.  . [DISCONTINUED] glycopyrrolate (ROBINUL) 2 MG tablet Take 1 tablet (2 mg total) by mouth 2 (two) times daily.  . [DISCONTINUED] ibuprofen (ADVIL,MOTRIN) 800 MG tablet Take 1 tablet (800 mg total) by mouth every 8 (eight) hours as needed for pain.  . [DISCONTINUED] levofloxacin (LEVAQUIN) 500 MG tablet Take 1 tablet (500 mg total) by mouth daily.  . [DISCONTINUED] nitrofurantoin, macrocrystal-monohydrate, (MACROBID) 100 MG capsule Take 1 capsule (100 mg total) by mouth 2 (two) times daily.  . [DISCONTINUED] oxyCODONE-acetaminophen (PERCOCET/ROXICET) 5-325 MG per tablet Take 1-2 tablets by mouth every 4 (four) hours as needed.  . [DISCONTINUED] phenazopyridine (PYRIDIUM) 200 MG tablet Take 1 tablet (200 mg total) by mouth 3 (three) times daily as needed for pain.  . [DISCONTINUED] pneumococcal 23 valent vaccine (PNU-IMMUNE) 25 MCG/0.5ML injection Inject 0.5 mLs into the muscle tomorrow at 10 am.  . [DISCONTINUED] sulfamethoxazole-trimethoprim (BACTRIM DS) 800-160 MG per tablet Take 1 tablet by mouth 2 (two) times daily.    Allergies  Allergen Reactions  . Penicillins Itching and Swelling    Review of Systems        The patient complains of anxiety.  The patient denies fever, chills, sweats, anorexia, fatigue, weakness, malaise, weight loss, sleep disorder, blurring, diplopia, eye irritation, eye discharge, vision loss, eye pain, photophobia, earache, ear  discharge, tinnitus, decreased hearing, nasal congestion, nosebleeds, sore throat, hoarseness, chest pain, palpitations, syncope, dyspnea on exertion, orthopnea, PND, peripheral edema, cough, dyspnea at rest, excessive sputum, hemoptysis, wheezing, pleurisy, nausea, vomiting, diarrhea, constipation, change in bowel habits, abdominal pain, melena, hematochezia, jaundice, gas/bloating, indigestion/heartburn, dysphagia, odynophagia, dysuria, hematuria, urinary frequency, urinary hesitancy, nocturia, incontinence, back pain, joint pain, joint swelling, muscle cramps, muscle weakness, stiffness, arthritis, sciatica, restless legs, leg pain at night, leg pain with exertion, rash, itching, dryness, suspicious lesions, paralysis, paresthesias, seizures, tremors, vertigo, transient blindness, frequent falls, frequent headaches, difficulty walking, depression, memory loss, confusion, cold intolerance, heat intolerance, polydipsia, polyphagia, polyuria, unusual weight change, abnormal bruising, bleeding, enlarged lymph nodes, urticaria, allergic rash, hay fever, and recurrent infections.     Objective:   Physical Exam     WD, Obese, 47 y/o BF in NAD... GENERAL:  Alert & oriented; pleasant & cooperative... HEENT:  [place]/AT, EOM-wnl, PERRLA, EACs-clear, TMs-wnl, NOSE-clear, THROAT-clear & wnl. NECK:  Supple w/ fairROM; no JVD; normal carotid impulses w/o bruits; no thyromegaly or nodules palpated; no lymphadenopathy. CHEST:  Clear to P & A; without wheezes/ rales/ or rhonchi. HEART:  Regular Rhythm; without murmurs/ rubs/ or gallops. ABDOMEN:  Obese, soft & nontender; normal bowel sounds; no organomegaly  or masses detected. EXT: without deformities, mild arthritic changes; no varicose veins/ +venous insuffic/ no edema. NEURO:  CN's intact;  no focal neuro deficits... DERM:  No lesions noted; no rash etc...  RADIOLOGY DATA:  Reviewed in the EPIC EMR & discussed w/ the patient...  LABORATORY DATA:  Reviewed in the  EPIC EMR & discussed w/ the patient...   Assessment & Plan:    HBP>  She ran out vs stopped all of her meds; BP is sl elev & we refilled Lisinopril & HCT, plus discussed the need for wt reduction...  CHOL>  She ran out of her Crestor; FLP on diet alone is way off 7 rec to take Simva40 every day, f/u FLP on this med to assess...  Obesity>  Everything is dependent on her losing weight but she has been ineffective w/ diet, exercise, etc; she has so far refused to consider bariatric surg...  DJD>  She will f/u w/ DrDean & Susann Givens, her Orthopedists... She wants Vicodin refilled...  Anxiety>  On Alprazolam prn...   Patient's Medications  New Prescriptions   PANTOPRAZOLE (PROTONIX) 40 MG TABLET    Take 1 tablet (40 mg total) by mouth daily. 30 minutes before the first meal of the day   SIMVASTATIN (ZOCOR) 40 MG TABLET    Take 1 tablet (40 mg total) by mouth at bedtime.  Previous Medications   HYDROCODONE-ACETAMINOPHEN (NORCO/VICODIN) 5-325 MG PER TABLET    Take 1 tablet by mouth every 6 (six) hours as needed for pain.  Modified Medications   Modified Medication Previous Medication   ALPRAZOLAM (XANAX) 0.5 MG TABLET ALPRAZolam (XANAX) 0.5 MG tablet      1/2 to 1 tablet three times daily as needed for nerves    1/2 to 1 tablet three times daily as needed for nerves   HYDROCHLOROTHIAZIDE (MICROZIDE) 12.5 MG CAPSULE hydrochlorothiazide (MICROZIDE) 12.5 MG capsule      TAKE ONE CAPSULE BY MOUTH EVERY DAY    TAKE ONE CAPSULE BY MOUTH EVERY DAY   LISINOPRIL (PRINIVIL,ZESTRIL) 20 MG TABLET lisinopril (PRINIVIL,ZESTRIL) 20 MG tablet      Take 1 tablet (20 mg total) by mouth daily.    Take 1 tablet (20 mg total) by mouth daily.   ROSUVASTATIN (CRESTOR) 10 MG TABLET rosuvastatin (CRESTOR) 10 MG tablet      Take 1 tablet (10 mg total) by mouth daily.    Take 1 tablet (10 mg total) by mouth daily.  Discontinued Medications   CLINDAMYCIN (CLEOCIN) 2 % VAGINAL CREAM    Place 1 Applicatorful vaginally at  bedtime.   GLYCOPYRROLATE (ROBINUL) 2 MG TABLET    Take 1 tablet (2 mg total) by mouth 2 (two) times daily.   IBUPROFEN (ADVIL,MOTRIN) 800 MG TABLET    Take 1 tablet (800 mg total) by mouth every 8 (eight) hours as needed for pain.   LEVOFLOXACIN (LEVAQUIN) 500 MG TABLET    Take 1 tablet (500 mg total) by mouth daily.   METOCLOPRAMIDE (REGLAN) 10 MG TABLET    Take 1 tablet (10 mg total) by mouth 3 (three) times daily with meals.   NITROFURANTOIN, MACROCRYSTAL-MONOHYDRATE, (MACROBID) 100 MG CAPSULE    Take 1 capsule (100 mg total) by mouth 2 (two) times daily.   OXYCODONE-ACETAMINOPHEN (PERCOCET/ROXICET) 5-325 MG PER TABLET    Take 1-2 tablets by mouth every 4 (four) hours as needed.   PHENAZOPYRIDINE (PYRIDIUM) 200 MG TABLET    Take 1 tablet (200 mg total) by mouth 3 (three) times daily  as needed for pain.   PNEUMOCOCCAL 23 VALENT VACCINE (PNU-IMMUNE) 25 MCG/0.5ML INJECTION    Inject 0.5 mLs into the muscle tomorrow at 10 am.   SULFAMETHOXAZOLE-TRIMETHOPRIM (BACTRIM DS) 800-160 MG PER TABLET    Take 1 tablet by mouth 2 (two) times daily.

## 2013-09-13 NOTE — Patient Instructions (Addendum)
Today we updated your med list in our EPIC system...    Continue your current medications the same...    We refilled your meds and added PROTONIX 40mg  - one tab each AM about 30 min before the first meal of the day...  Today we did your follow up FASTING blood work...    We will contact you w/ the results when available...   Let's get on track w/ our diet & exercise program...    The goal is to lose that 1st 20 lbs...  We will arrange for a follow up appt w/ GI- DrGessner...  Call for any questions.Marland KitchenMarland Kitchen

## 2013-09-15 ENCOUNTER — Other Ambulatory Visit: Payer: Self-pay | Admitting: Pulmonary Disease

## 2013-09-15 MED ORDER — SIMVASTATIN 40 MG PO TABS: 40.0000 mg | ORAL_TABLET | Freq: Every day | ORAL | Status: DC

## 2013-09-28 ENCOUNTER — Telehealth: Payer: Self-pay | Admitting: Pulmonary Disease

## 2013-09-28 NOTE — Telephone Encounter (Signed)
[id number].Wally Shevchenko, CMA  

## 2013-09-29 ENCOUNTER — Telehealth: Payer: Self-pay | Admitting: Pulmonary Disease

## 2013-09-29 MED ORDER — OSELTAMIVIR PHOSPHATE 75 MG PO CAPS: 75.0000 mg | ORAL_CAPSULE | Freq: Two times a day (BID) | ORAL | Status: DC

## 2013-09-29 NOTE — Telephone Encounter (Signed)
Last OV 09-13-13 with SN, HTN, obesity, IBS. Pt is c/o having sore throat, productive cough with green phlegm, sinus congestion with green mucus, headache, body aches, temp 102 x 3 days. Pt is not aware of any flu exposure. She has not been to work since Monday due to illness. Please advise. [place] Bing, CMA Allergies  Allergen Reactions  . Penicillins Itching and Swelling

## 2013-09-29 NOTE — Telephone Encounter (Signed)
Spoke with pt. She can't afford the tamiflu. Per TD this is the only thing that we can call in since TP thinks it is the flu so we just have to treat the symptoms.  Take tylenol, mucinex, increase fluids, rest, can do nasal saline spray.  I called pt and made her aware. She will do so. Nothing further needed

## 2013-09-29 NOTE — Telephone Encounter (Signed)
Sounds like the flu  Tamiflu 75mg  Twice daily  #10  tx sx w/ mucinex , fluids, tylenol , etc Please contact office for sooner follow up if symptoms do not improve or worsen or seek emergency care

## 2013-09-29 NOTE — Telephone Encounter (Signed)
Pt aware of recs. RX has been sent. Nothing further needed

## 2013-10-12 ENCOUNTER — Encounter: Payer: Self-pay | Admitting: Internal Medicine

## 2013-10-12 ENCOUNTER — Ambulatory Visit (INDEPENDENT_AMBULATORY_CARE_PROVIDER_SITE_OTHER): Payer: BC Managed Care – PPO | Admitting: Internal Medicine

## 2013-10-12 VITALS — BP 134/86 | HR 100 | Ht 62.0 in | Wt 247.0 lb

## 2013-10-12 DIAGNOSIS — K589 Irritable bowel syndrome without diarrhea: Secondary | ICD-10-CM

## 2013-10-12 MED ORDER — ALOSETRON HCL 0.5 MG PO TABS: 0.5000 mg | ORAL_TABLET | Freq: Two times a day (BID) | ORAL | Status: DC

## 2013-10-12 NOTE — Assessment & Plan Note (Addendum)
I have recommended she take Lotronex 0.5 mg bid - full side effect discussion undertaken, she has read the informed consent she signed. I've explained to her that if she develops constipation or abdominal pain she should stop the medication and call me. Note that this was not on her formulary but we have a reduced payment card which may make this affordable for her and she will try that. If that is not possible then may have to try something like colestipol. Plan to Rx and F/U 6 weeks

## 2013-10-12 NOTE — Progress Notes (Signed)
         Subjective:    Patient ID: Dana [name], female    DOB: 1965/12/01, 48 y.o.   MRN: [id number]  HPI The patient is here for followup, she has a diagnosis of diarrhea predominant irritable bowel syndrome. I last saw her in 2013. At that time point she had a colonoscopy was negative random biopsies and terminal ileum inspection. It was my impression that she was improved on glycopyrrolate. Followup was recommended several weeks after her colonoscopy but this is the first time I have seen her since then. She tells me now that the glycopyrrolate helps just a little bit. She continues to have urgent postprandial defecation that is loose and diarrheal and creates significant quality of life issues. It is associated with significant abdominal cramping. In the past she had used dicyclomine but it did not help.  Medications, allergies, past medical history, past surgical history, family history and social history are reviewed and updated in the EMR.  Review of Systems As above. Wt Readings from Last 3 Encounters:  10/12/13 247 lb (112.038 kg)  09/13/13 248 lb 12.8 oz (112.855 kg)  09/03/12 235 lb (106.595 kg)      Objective:   Physical Exam Obese pleasant black woman in no acute distress Back is non-tender Abdomen soft, bowel sounds present, nontender and no hepatosplenomegaly or mass    Assessment & Plan:   1. IBS (irritable bowel syndrome) - diarrhea predominant    15 minutes time spent with patient over at which is in counseling and coordination of care

## 2013-10-12 NOTE — Patient Instructions (Addendum)
Today you have been given a written rx for Lotronex along with a savings program card to use.   If you get constipated or increased abdominal pain stop the Lotronex and call us.   Follow up with [place] in 6 weeks.    I appreciate the opportunity to care for you.   Signed consent sent down to be scanned in.

## 2013-11-25 ENCOUNTER — Ambulatory Visit: Payer: BC Managed Care – PPO | Admitting: Internal Medicine

## 2013-12-16 ENCOUNTER — Encounter: Payer: 59 | Admitting: Gynecology

## 2013-12-27 ENCOUNTER — Other Ambulatory Visit: Payer: Self-pay

## 2013-12-27 DIAGNOSIS — Z1231 Encounter for screening mammogram for malignant neoplasm of breast: Secondary | ICD-10-CM

## 2014-01-14 ENCOUNTER — Telehealth: Payer: Self-pay | Admitting: Pulmonary Disease

## 2014-01-14 ENCOUNTER — Other Ambulatory Visit: Payer: Self-pay | Admitting: Gynecology

## 2014-01-14 ENCOUNTER — Encounter (INDEPENDENT_AMBULATORY_CARE_PROVIDER_SITE_OTHER): Payer: Self-pay

## 2014-01-14 ENCOUNTER — Ambulatory Visit
Admission: RE | Admit: 2014-01-14 | Discharge: 2014-01-14 | Disposition: A | Discharge status: Home / Self Care | Payer: BC Managed Care – PPO | Source: Ambulatory Visit

## 2014-01-14 DIAGNOSIS — Z1231 Encounter for screening mammogram for malignant neoplasm of breast: Secondary | ICD-10-CM

## 2014-01-14 DIAGNOSIS — N6452 Nipple discharge: Secondary | ICD-10-CM

## 2014-01-14 NOTE — Telephone Encounter (Signed)
Yes I will see her thanks  

## 2014-01-14 NOTE — Telephone Encounter (Signed)
Pt states husband (wil Musick) had appt yesterday, and her mother in law. Pt would like to know if you will accept her? Husband states you said ok. pls advise

## 2014-01-17 NOTE — Telephone Encounter (Signed)
appt sche °

## 2014-01-28 ENCOUNTER — Ambulatory Visit
Admission: RE | Admit: 2014-01-28 | Discharge: 2014-01-28 | Disposition: A | Discharge status: Home / Self Care | Payer: BC Managed Care – PPO | Source: Ambulatory Visit | Attending: Gynecology | Admitting: Gynecology

## 2014-01-28 ENCOUNTER — Other Ambulatory Visit: Payer: Self-pay | Admitting: Gynecology

## 2014-01-28 DIAGNOSIS — N6452 Nipple discharge: Secondary | ICD-10-CM

## 2014-02-04 ENCOUNTER — Ambulatory Visit
Admission: RE | Admit: 2014-02-04 | Discharge: 2014-02-04 | Disposition: A | Discharge status: Home / Self Care | Payer: BC Managed Care – PPO | Source: Ambulatory Visit | Attending: Gynecology | Admitting: Gynecology

## 2014-02-04 ENCOUNTER — Other Ambulatory Visit: Payer: Self-pay | Admitting: Gynecology

## 2014-02-04 DIAGNOSIS — N6452 Nipple discharge: Secondary | ICD-10-CM

## 2014-03-02 ENCOUNTER — Ambulatory Visit (INDEPENDENT_AMBULATORY_CARE_PROVIDER_SITE_OTHER): Payer: BC Managed Care – PPO | Admitting: Family Medicine

## 2014-03-02 VITALS — BP 155/104 | HR 96 | Temp 98.6°F | Wt 244.0 lb

## 2014-03-02 DIAGNOSIS — I10 Essential (primary) hypertension: Secondary | ICD-10-CM

## 2014-03-02 DIAGNOSIS — M1990 Unspecified osteoarthritis, unspecified site: Secondary | ICD-10-CM

## 2014-03-03 ENCOUNTER — Encounter: Payer: Self-pay | Admitting: Family Medicine

## 2014-03-03 NOTE — Progress Notes (Signed)
   Subjective:    Patient ID: Dana [name], female    DOB: 1966-05-27, 48 y.o.   MRN: [id number]  HPI 48 yr old female here to establish with [place], but we were not able to complete this visit. The power was out in our clinic today and with no availability of computers, we decided to reschedule this visit. She is out of some medications, however.    Review of Systems  Constitutional: Negative.        Objective:   Physical Exam  Constitutional: She appears well-developed and well-nourished.          Assessment & Plan:  I refilled her Lisinopril and her Norco. She will reschedule the visit.

## 2014-03-04 ENCOUNTER — Telehealth: Payer: Self-pay | Admitting: Family Medicine

## 2014-03-04 NOTE — Telephone Encounter (Signed)
Relevant patient education mailed to patient.  

## 2014-03-07 ENCOUNTER — Ambulatory Visit (INDEPENDENT_AMBULATORY_CARE_PROVIDER_SITE_OTHER): Payer: BC Managed Care – PPO | Admitting: General Surgery

## 2014-03-07 ENCOUNTER — Encounter (INDEPENDENT_AMBULATORY_CARE_PROVIDER_SITE_OTHER): Payer: Self-pay | Admitting: General Surgery

## 2014-03-07 VITALS — BP 134/82 | HR 80 | Temp 98.0°F | Resp 18 | Ht 63.0 in | Wt 243.0 lb

## 2014-03-07 DIAGNOSIS — D241 Benign neoplasm of right breast: Secondary | ICD-10-CM

## 2014-03-07 DIAGNOSIS — D249 Benign neoplasm of unspecified breast: Secondary | ICD-10-CM

## 2014-03-07 NOTE — Patient Instructions (Signed)
We will call you to schedule the surgery.   [place] Office Phone Number [phone]  BREAST BIOPSY/ PARTIAL MASTECTOMY: POST OP INSTRUCTIONS  Always review your discharge instruction sheet given to you by the facility where your surgery was performed.  IF YOU HAVE DISABILITY OR FAMILY LEAVE FORMS, YOU MUST BRING THEM TO THE OFFICE FOR PROCESSING.  DO NOT GIVE THEM TO YOUR DOCTOR.  1. A prescription for pain medication may be given to you upon discharge.  Take your pain medication as prescribed, if needed.  If narcotic pain medicine is not needed, then you may take acetaminophen (Tylenol) or ibuprofen (Advil) as needed. 2. Take your usually prescribed medications unless otherwise directed 3. If you need a refill on your pain medication, please contact your pharmacy.  They will contact our office to request authorization.  Prescriptions will not be filled after 5pm or on week-ends. 4. You should eat very light the first 24 hours after surgery, such as soup, crackers, pudding, etc.  Resume your normal diet the day after surgery. 5. Most patients will experience some swelling and bruising in the breast.  Ice packs and a good support bra will help.  Swelling and bruising can take several days to resolve.  6. It is common to experience some constipation if taking pain medication after surgery.  Increasing fluid intake and taking a stool softener will usually help or prevent this problem from occurring.  A mild laxative (Milk of Magnesia or Miralax) should be taken according to package directions if there are no bowel movements after 48 hours. 7. Unless discharge instructions indicate otherwise, you may remove your bandages 24-48 hours after surgery, and you may shower at that time.  You may have steri-strips (small skin tapes) in place directly over the incision.  These strips should be left on the skin for 7-10 days.  If your surgeon used skin glue on the incision, you may shower in  24 hours.  The glue will flake off over the next 2-3 weeks.  Any sutures or staples will be removed at the office during your follow-up visit. 8. ACTIVITIES:  You may resume regular daily activities (gradually increasing) beginning the next day.  Wearing a good support bra or sports bra minimizes pain and swelling.  You may have sexual intercourse when it is comfortable. a. You may drive when you no longer are taking prescription pain medication, you can comfortably wear a seatbelt, and you can safely maneuver your car and apply brakes. b. RETURN TO WORK:  ______________________________________________________________________________________ 9. You should see your doctor in the office for a follow-up appointment approximately two weeks after your surgery.  Your doctor's nurse will typically make your follow-up appointment when she calls you with your pathology report.  Expect your pathology report 2-3 business days after your surgery.  You may call to check if you do not hear from [place] after three days. 10. OTHER INSTRUCTIONS: _______________________________________________________________________________________________ _____________________________________________________________________________________________________________________________________ _____________________________________________________________________________________________________________________________________ _____________________________________________________________________________________________________________________________________  WHEN TO CALL YOUR DOCTOR: 1. Fever over 101.0 2. Nausea and/or vomiting. 3. Extreme swelling or bruising. 4. Continued bleeding from incision. 5. Increased pain, redness, or drainage from the incision.  The clinic staff is available to answer your questions during regular business hours.  Please don't hesitate to call and ask to speak to one of the nurses for clinical concerns.  If you have  a medical emergency, go to the nearest emergency room or call 911.  A surgeon from [facility] is always on call at  the hospital.  For further questions, please visit [email or web address]

## 2014-03-07 NOTE — Progress Notes (Signed)
Patient ID: Dana [name], female   DOB: 1965-11-19, 48 y.o.   MRN: [id number]  Chief Complaint  Patient presents with  . Establish Care    rt breast     HPI Dana [name] is a 48 y.o. female.   HPI She is referred by Dr. Shelly Bombard to evaluate for removal of a right breast fibroadenoma.  She had bilateral clear nipple discharge. She was noted to have a 5 mm mass in the subareolar 6:00 position of the right breast. Image guided biopsy was performed. Pathology was consistent with a fibroadenoma. It was incompletely removed. She was not comfortable leaving the mass in her breast and requested referral to a surgeon to discuss removal. There is a history of breast cancer in a maternal aunt.  She has had a previous right breast biopsy that was benign.   Past Medical History  Diagnosis Date  . Tobacco use disorder   . Mild hypertension   . Hypercholesterolemia   . Obesity   . Breast mass, right   . DJD (degenerative joint disease)   . Back pain with radiation   . Headache(784.0)   . Anxiety   . Groin abscess   . IBS (irritable bowel syndrome)     Past Surgical History  Procedure Laterality Date  . Tubal ligation    . Lumbar disc surgery    . Finger surgery      Right index-excision of mass   . Breast lumpectomy    . Foot surgery    . Laparoscopic hysterectomy  09/03/2012    Procedure: HYSTERECTOMY TOTAL LAPAROSCOPIC;  Surgeon: [name], MD;  Location: [facility];  Service: Gynecology;  Laterality: N/A;  . Bilateral salpingectomy  09/03/2012    Procedure: BILATERAL SALPINGECTOMY;  Surgeon: [name], MD;  Location: [facility];  Service: Gynecology;  Laterality: Bilateral;  . Esophagogastroduodenoscopy    . Colonoscopy w/ biopsies      Family History  Problem Relation Age of Onset  . Diabetes Father   . Diabetes Mother   . Prostate cancer Paternal Grandfather   . Prostate cancer Paternal Grandfather   . Breast cancer Maternal Aunt 90    Social History History   Substance Use Topics  . Smoking status: Current Every Day Smoker    Types: Cigarettes  . Smokeless tobacco: Never Used     Comment: smokes 1 cig daily  . Alcohol Use: 7.0 oz/week    14 drink(s) per week     Comment: social use    Allergies  Allergen Reactions  . Penicillins Itching and Swelling    Current Outpatient Prescriptions  Medication Sig Dispense Refill  . alosetron (LOTRONEX) 0.5 MG tablet Take 1 tablet (0.5 mg total) by mouth 2 (two) times daily.  60 tablet  1  . ALPRAZolam (XANAX) 0.5 MG tablet 1/2 to 1 tablet three times daily as needed for nerves  90 tablet  5  . hydrochlorothiazide (MICROZIDE) 12.5 MG capsule TAKE ONE CAPSULE BY MOUTH EVERY DAY  30 capsule  11  . HYDROcodone-acetaminophen (NORCO/VICODIN) 5-325 MG per tablet Take 1 tablet by mouth every 6 (six) hours as needed for pain.  10 tablet  0  . lisinopril (PRINIVIL,ZESTRIL) 20 MG tablet Take 1 tablet (20 mg total) by mouth daily.  30 tablet  11  . pantoprazole (PROTONIX) 40 MG tablet Take 1 tablet (40 mg total) by mouth daily. 30 minutes before the first meal of the day  30 tablet  11  .  rosuvastatin (CRESTOR) 10 MG tablet Take 1 tablet (10 mg total) by mouth daily.  30 tablet  11  . simvastatin (ZOCOR) 40 MG tablet Take 1 tablet (40 mg total) by mouth at bedtime.  90 tablet  3   No current facility-administered medications for this visit.    Review of Systems Review of Systems  All other systems reviewed and are negative.   Blood pressure 134/82, pulse 80, temperature 98 F (36.7 C), resp. rate 18, height 5\' 3"  (1.6 m), weight 243 lb (110.224 kg), last menstrual period 08/17/2012.  Physical Exam Physical Exam  Constitutional: No distress.  Overweight  HENT:  Head: Normocephalic and atraumatic.  Eyes: No scleral icterus.  Neck: Neck supple.  Pulmonary/Chest:  Right breast-inferior scar present. No obvious palpable mass in the subareolar position 6:00 area.  Left breast-no palpable mass or  suspicious skin change.    Musculoskeletal:  No palpable axillary or supraclavicular adenopathy.  Lymphadenopathy:    She has no cervical adenopathy.    Data Reviewed Mammogram and mammogram report. Ultrasound report. Pathology results.  Assessment    Right breast fibroadenoma-she is not comfortable leaving this in her breast and would like to have it removed. It is not currently palpable on today's examination.     Plan    Removal of right breast fibroadenoma after wire localization. The procedure and risks were discussed with her. Risks include but are not limited to bleeding, infection, wound healing problems, anesthesia. She seems to understand all this and would like to proceed.        ROSENBOWER,TODD J 03/07/2014, 4:50 PM

## 2014-03-14 ENCOUNTER — Ambulatory Visit: Payer: BC Managed Care – PPO | Admitting: Family Medicine

## 2014-03-16 ENCOUNTER — Other Ambulatory Visit (INDEPENDENT_AMBULATORY_CARE_PROVIDER_SITE_OTHER): Payer: Self-pay | Admitting: General Surgery

## 2014-03-16 DIAGNOSIS — D241 Benign neoplasm of right breast: Secondary | ICD-10-CM

## 2014-03-30 ENCOUNTER — Encounter (HOSPITAL_COMMUNITY)
Admission: RE | Admit: 2014-03-30 | Discharge: 2014-03-30 | Disposition: A | Discharge status: Home / Self Care | Payer: BC Managed Care – PPO | Source: Ambulatory Visit | Attending: Anesthesiology | Admitting: Anesthesiology

## 2014-03-30 ENCOUNTER — Other Ambulatory Visit: Payer: Self-pay | Admitting: Family Medicine

## 2014-03-30 ENCOUNTER — Encounter (HOSPITAL_COMMUNITY): Payer: Self-pay

## 2014-03-30 ENCOUNTER — Encounter (HOSPITAL_COMMUNITY)
Admission: RE | Admit: 2014-03-30 | Discharge: 2014-03-30 | Disposition: A | Discharge status: Home / Self Care | Payer: BC Managed Care – PPO | Source: Ambulatory Visit | Attending: General Surgery | Admitting: General Surgery

## 2014-03-30 ENCOUNTER — Telehealth: Payer: Self-pay | Admitting: Family Medicine

## 2014-03-30 DIAGNOSIS — Z01812 Encounter for preprocedural laboratory examination: Secondary | ICD-10-CM | POA: Insufficient documentation

## 2014-03-30 DIAGNOSIS — Z01810 Encounter for preprocedural cardiovascular examination: Secondary | ICD-10-CM | POA: Insufficient documentation

## 2014-03-30 DIAGNOSIS — Z01818 Encounter for other preprocedural examination: Secondary | ICD-10-CM | POA: Insufficient documentation

## 2014-03-30 HISTORY — DX: Personal history of other diseases of the digestive system: Z87.19

## 2014-03-30 HISTORY — DX: Lactose intolerance, unspecified: E73.9

## 2014-03-30 LAB — COMPREHENSIVE METABOLIC PANEL
ALT: 27 U/L (ref 0–35)
AST: 25 U/L (ref 0–37)
Albumin: 3.8 g/dL (ref 3.5–5.2)
Alkaline Phosphatase: 81 U/L (ref 39–117)
Anion gap: 12 (ref 5–15)
BILIRUBIN TOTAL: 0.3 mg/dL (ref 0.3–1.2)
BUN: 11 mg/dL (ref 6–23)
CALCIUM: 8.9 mg/dL (ref 8.4–10.5)
CO2: 29 meq/L (ref 19–32)
CREATININE: 0.75 mg/dL (ref 0.50–1.10)
Chloride: 101 mEq/L (ref 96–112)
GFR calc Af Amer: >90 mL/min (ref >90)
Glucose, Bld: 92 mg/dL (ref 70–99)
Potassium: 3.9 mEq/L (ref 3.7–5.3)
Sodium: 142 mEq/L (ref 137–147)
Total Protein: 7.2 g/dL (ref 6.0–8.3)

## 2014-03-30 LAB — CBC WITH DIFFERENTIAL/PLATELET
Basophils Absolute: 0 10*3/uL (ref 0.0–0.1)
Basophils Relative: 0 % (ref 0–1)
EOS PCT: 1 % (ref 0–5)
Eosinophils Absolute: 0.1 10*3/uL (ref 0.0–0.7)
HCT: 40.2 % (ref 36.0–46.0)
HEMOGLOBIN: 13 g/dL (ref 12.0–15.0)
LYMPHS ABS: 2.1 10*3/uL (ref 0.7–4.0)
Lymphocytes Relative: 21 % (ref 12–46)
MCH: 29.4 pg (ref 26.0–34.0)
MCHC: 32.3 g/dL (ref 30.0–36.0)
MCV: 91 fL (ref 78.0–100.0)
Monocytes Absolute: 0.9 10*3/uL (ref 0.1–1.0)
Monocytes Relative: 9 % (ref 3–12)
NEUTROS ABS: 6.8 10*3/uL (ref 1.7–7.7)
Neutrophils Relative %: 69 % (ref 43–77)
Platelets: 258 10*3/uL (ref 150–400)
RBC: 4.42 MIL/uL (ref 3.87–5.11)
RDW: 14.2 % (ref 11.5–15.5)
WBC: 9.9 10*3/uL (ref 4.0–10.5)

## 2014-03-30 LAB — PROTIME-INR
INR: 0.96 (ref 0.00–1.49)
PROTHROMBIN TIME: 12.8 s (ref 11.6–15.2)

## 2014-03-30 NOTE — Pre-Procedure Instructions (Signed)
Dana [name]  03/30/2014   Your procedure is scheduled on: Thursday, April 07, 2014  Sat. 04/02/14)Report to [facility] Admitting at 7:30 AM.  Call this number if you have problems the morning of surgery: [phone]   Remember:   Do not eat food or drink liquids after midnight Wednesday, April 06, 2014   Take these medicines the morning of surgery with A SIP OF WATER: alosetron (LOTRONEX), pantoprazole (PROTONIX), if needed:ALPRAZolam [name]) for nerves, HYDROcodone (NORCO/VICODIN) for pain Stop taking Aspirin, vitamins,  and herbal medications. Do not take any NSAIDs ie: Ibuprofen, Advil, Naproxen or any medication containing Aspirin; stop 5 days prior to procedure ( Sat. 04/02/14)   Do not wear jewelry, make-up or nail polish.  Do not wear lotions, powders, or perfumes. You may NOT wear deodorant.  Do not shave 48 hours prior to surgery.   Do not bring valuables to the hospital.  [facility] is not responsible for any belongings or valuables.               Contacts, dentures or bridgework may not be worn into surgery.  Leave suitcase in the car. After surgery it may be brought to your room.  For patients admitted to the hospital, discharge time is determined by your treatment team.               Patients discharged the day of surgery will not be allowed to drive home.  Name and phone number of your driver:     Special Instructions: Shower using CHG the night before surgery and the the morning of surgery.   Please read over the following fact sheets that you were given: Pain Booklet, Coughing and Deep Breathing and Surgical Site Infection Prevention

## 2014-03-30 NOTE — Telephone Encounter (Signed)
Call in #[phone] with 5 rf 

## 2014-03-30 NOTE — Telephone Encounter (Signed)
Pt is needing new rx for ALPRAZolam (XANAX) 0.5 MG tablet, send to [facility]

## 2014-04-01 NOTE — Telephone Encounter (Signed)
I spoke with pharmacy and script is ready for pick up with refills.

## 2014-04-06 MED ORDER — SODIUM CHLORIDE 0.9 % IV SOLN
1500.0000 mg | INTRAVENOUS | Status: AC
  Administered 2014-04-07: 1500 mg via INTRAVENOUS
  Filled 2014-04-06: qty 1500

## 2014-04-07 ENCOUNTER — Ambulatory Visit
Admission: RE | Admit: 2014-04-07 | Discharge: 2014-04-07 | Disposition: A | Discharge status: Home / Self Care | Payer: BC Managed Care – PPO | Source: Ambulatory Visit | Attending: General Surgery | Admitting: General Surgery

## 2014-04-07 ENCOUNTER — Encounter (HOSPITAL_BASED_OUTPATIENT_CLINIC_OR_DEPARTMENT_OTHER): Admission: RE | Payer: Self-pay | Source: Ambulatory Visit

## 2014-04-07 ENCOUNTER — Encounter (HOSPITAL_COMMUNITY)
Admission: RE | Disposition: A | Discharge status: Home / Self Care | Payer: Self-pay | Source: Ambulatory Visit | Attending: General Surgery

## 2014-04-07 ENCOUNTER — Observation Stay (HOSPITAL_COMMUNITY)
Admission: EM | Admit: 2014-04-07 | Discharge: 2014-04-09 | Disposition: A | Discharge status: Home / Self Care | Payer: BC Managed Care – PPO | Attending: Internal Medicine | Admitting: Internal Medicine

## 2014-04-07 ENCOUNTER — Ambulatory Visit (HOSPITAL_COMMUNITY)
Admission: RE | Admit: 2014-04-07 | Discharge: 2014-04-07 | Disposition: A | Discharge status: Home / Self Care | Payer: BC Managed Care – PPO | Source: Ambulatory Visit | Attending: General Surgery | Admitting: General Surgery

## 2014-04-07 ENCOUNTER — Encounter (HOSPITAL_COMMUNITY): Payer: Self-pay | Admitting: Emergency Medicine

## 2014-04-07 ENCOUNTER — Ambulatory Visit (HOSPITAL_BASED_OUTPATIENT_CLINIC_OR_DEPARTMENT_OTHER): Admission: RE | Admit: 2014-04-07 | Payer: BC Managed Care – PPO | Source: Ambulatory Visit | Admitting: General Surgery

## 2014-04-07 ENCOUNTER — Encounter (HOSPITAL_COMMUNITY): Payer: Self-pay | Admitting: *Deleted

## 2014-04-07 ENCOUNTER — Telehealth (INDEPENDENT_AMBULATORY_CARE_PROVIDER_SITE_OTHER): Payer: Self-pay

## 2014-04-07 ENCOUNTER — Encounter (HOSPITAL_COMMUNITY): Payer: BC Managed Care – PPO | Admitting: Anesthesiology

## 2014-04-07 ENCOUNTER — Ambulatory Visit (HOSPITAL_COMMUNITY): Payer: BC Managed Care – PPO | Admitting: Anesthesiology

## 2014-04-07 ENCOUNTER — Ambulatory Visit
Admit: 2014-04-07 | Discharge: 2014-04-07 | Disposition: A | Discharge status: Home / Self Care | Payer: BC Managed Care – PPO | Attending: General Surgery | Admitting: General Surgery

## 2014-04-07 DIAGNOSIS — E669 Obesity, unspecified: Secondary | ICD-10-CM | POA: Diagnosis not present

## 2014-04-07 DIAGNOSIS — Z880 Allergy status to penicillin: Secondary | ICD-10-CM | POA: Insufficient documentation

## 2014-04-07 DIAGNOSIS — F411 Generalized anxiety disorder: Secondary | ICD-10-CM | POA: Insufficient documentation

## 2014-04-07 DIAGNOSIS — D241 Benign neoplasm of right breast: Secondary | ICD-10-CM

## 2014-04-07 DIAGNOSIS — T783XXA Angioneurotic edema, initial encounter: Secondary | ICD-10-CM | POA: Diagnosis present

## 2014-04-07 DIAGNOSIS — Z9010 Acquired absence of unspecified breast and nipple: Secondary | ICD-10-CM | POA: Insufficient documentation

## 2014-04-07 DIAGNOSIS — F17200 Nicotine dependence, unspecified, uncomplicated: Secondary | ICD-10-CM

## 2014-04-07 DIAGNOSIS — E7800 Pure hypercholesterolemia, unspecified: Secondary | ICD-10-CM | POA: Insufficient documentation

## 2014-04-07 DIAGNOSIS — D249 Benign neoplasm of unspecified breast: Secondary | ICD-10-CM | POA: Diagnosis present

## 2014-04-07 DIAGNOSIS — Z79899 Other long term (current) drug therapy: Secondary | ICD-10-CM | POA: Insufficient documentation

## 2014-04-07 DIAGNOSIS — K589 Irritable bowel syndrome without diarrhea: Secondary | ICD-10-CM | POA: Insufficient documentation

## 2014-04-07 DIAGNOSIS — M1990 Unspecified osteoarthritis, unspecified site: Secondary | ICD-10-CM | POA: Insufficient documentation

## 2014-04-07 DIAGNOSIS — I10 Essential (primary) hypertension: Secondary | ICD-10-CM | POA: Diagnosis present

## 2014-04-07 DIAGNOSIS — T8859XA Other complications of anesthesia, initial encounter: Secondary | ICD-10-CM

## 2014-04-07 DIAGNOSIS — T46905A Adverse effect of unspecified agents primarily affecting the cardiovascular system, initial encounter: Secondary | ICD-10-CM | POA: Insufficient documentation

## 2014-04-07 HISTORY — DX: Other complications of anesthesia, initial encounter: T88.59XA

## 2014-04-07 HISTORY — PX: BREAST BIOPSY: SHX20

## 2014-04-07 SURGERY — BREAST LUMPECTOMY WITH NEEDLE LOCALIZATION
Anesthesia: General | Site: Breast | Laterality: Right

## 2014-04-07 SURGERY — BREAST BIOPSY WITH NEEDLE LOCALIZATION
Anesthesia: General | Site: Breast | Laterality: Right

## 2014-04-07 MED ORDER — OXYCODONE HCL 5 MG PO TABS: 5.0000 mg | ORAL_TABLET | ORAL | Status: DC | PRN

## 2014-04-07 MED ORDER — SODIUM CHLORIDE 0.9 % IJ SOLN
INTRAMUSCULAR | Status: AC
  Filled 2014-04-07: qty 10

## 2014-04-07 MED ORDER — FENTANYL CITRATE 0.05 MG/ML IJ SOLN
50.0000 ug | Freq: Once | INTRAMUSCULAR | Status: AC
  Administered 2014-04-07: 50 ug via INTRAVENOUS
  Filled 2014-04-07: qty 2

## 2014-04-07 MED ORDER — ONDANSETRON HCL 4 MG/2ML IJ SOLN: 4.0000 mg | Freq: Once | INTRAMUSCULAR | Status: DC | PRN

## 2014-04-07 MED ORDER — FAMOTIDINE IN NACL 20-0.9 MG/50ML-% IV SOLN
20.0000 mg | Freq: Two times a day (BID) | INTRAVENOUS | Status: DC
  Administered 2014-04-08 (×2): 20 mg via INTRAVENOUS
  Filled 2014-04-07 (×3): qty 50

## 2014-04-07 MED ORDER — HYDROCHLOROTHIAZIDE 12.5 MG PO CAPS
12.5000 mg | ORAL_CAPSULE | Freq: Every day | ORAL | Status: DC
  Administered 2014-04-08 – 2014-04-09 (×3): 12.5 mg via ORAL
  Filled 2014-04-07 (×3): qty 1

## 2014-04-07 MED ORDER — OXYCODONE HCL 5 MG/5ML PO SOLN: 5.0000 mg | Freq: Once | ORAL | Status: AC | PRN

## 2014-04-07 MED ORDER — EPHEDRINE SULFATE 50 MG/ML IJ SOLN
INTRAMUSCULAR | Status: AC
  Filled 2014-04-07: qty 1

## 2014-04-07 MED ORDER — LIDOCAINE HCL (CARDIAC) 20 MG/ML IV SOLN
INTRAVENOUS | Status: DC | PRN
  Administered 2014-04-07: 40 mg via INTRAVENOUS

## 2014-04-07 MED ORDER — ROCURONIUM BROMIDE 100 MG/10ML IV SOLN: INTRAVENOUS | Status: DC | PRN

## 2014-04-07 MED ORDER — EPHEDRINE SULFATE 50 MG/ML IJ SOLN
INTRAMUSCULAR | Status: DC | PRN
  Administered 2014-04-07: 20 mg via INTRAVENOUS
  Administered 2014-04-07 (×3): 10 mg via INTRAVENOUS

## 2014-04-07 MED ORDER — HYDROMORPHONE HCL PF 1 MG/ML IJ SOLN
INTRAMUSCULAR | Status: AC
  Filled 2014-04-07: qty 1

## 2014-04-07 MED ORDER — HYDROCODONE-ACETAMINOPHEN 5-325 MG PO TABS
1.0000 | ORAL_TABLET | ORAL | Status: DC | PRN
  Administered 2014-04-08 – 2014-04-09 (×3): 2 via ORAL
  Filled 2014-04-07 (×2): qty 2
  Filled 2014-04-07: qty 1
  Filled 2014-04-07: qty 2

## 2014-04-07 MED ORDER — OXYCODONE HCL 5 MG PO TABS
5.0000 mg | ORAL_TABLET | Freq: Once | ORAL | Status: AC | PRN
  Administered 2014-04-07: 5 mg via ORAL

## 2014-04-07 MED ORDER — ONDANSETRON HCL 4 MG/2ML IJ SOLN
INTRAMUSCULAR | Status: DC | PRN
  Administered 2014-04-07: 4 mg via INTRAVENOUS

## 2014-04-07 MED ORDER — LACTATED RINGERS IV SOLN
INTRAVENOUS | Status: DC | PRN
  Administered 2014-04-07: 09:00:00 via INTRAVENOUS

## 2014-04-07 MED ORDER — PROPOFOL 10 MG/ML IV BOLUS
INTRAVENOUS | Status: DC | PRN
  Administered 2014-04-07: 160 mg via INTRAVENOUS

## 2014-04-07 MED ORDER — MORPHINE SULFATE 2 MG/ML IJ SOLN
2.0000 mg | INTRAMUSCULAR | Status: DC | PRN
  Administered 2014-04-07 – 2014-04-08 (×2): 2 mg via INTRAVENOUS
  Filled 2014-04-07 (×2): qty 1

## 2014-04-07 MED ORDER — OXYCODONE HCL 5 MG PO TABS
ORAL_TABLET | ORAL | Status: AC
  Filled 2014-04-07: qty 1

## 2014-04-07 MED ORDER — HYDROMORPHONE HCL PF 1 MG/ML IJ SOLN
1.0000 mg | INTRAMUSCULAR | Status: DC | PRN
  Administered 2014-04-08 (×2): 1 mg via INTRAVENOUS
  Filled 2014-04-07 (×2): qty 1

## 2014-04-07 MED ORDER — DEXAMETHASONE SODIUM PHOSPHATE 4 MG/ML IJ SOLN
INTRAMUSCULAR | Status: DC | PRN
  Administered 2014-04-07: 4 mg via INTRAVENOUS

## 2014-04-07 MED ORDER — DEXAMETHASONE SODIUM PHOSPHATE 4 MG/ML IJ SOLN
INTRAMUSCULAR | Status: AC
  Filled 2014-04-07: qty 1

## 2014-04-07 MED ORDER — FENTANYL CITRATE 0.05 MG/ML IJ SOLN
INTRAMUSCULAR | Status: AC
  Filled 2014-04-07: qty 5

## 2014-04-07 MED ORDER — SODIUM CHLORIDE 0.9 % IV SOLN
INTRAVENOUS | Status: DC | PRN
  Administered 2014-04-07: 10:00:00 via INTRAVENOUS

## 2014-04-07 MED ORDER — FENTANYL CITRATE 0.05 MG/ML IJ SOLN
INTRAMUSCULAR | Status: DC | PRN
  Administered 2014-04-07 (×5): 50 ug via INTRAVENOUS

## 2014-04-07 MED ORDER — HYDROMORPHONE HCL PF 1 MG/ML IJ SOLN
0.2500 mg | INTRAMUSCULAR | Status: DC | PRN
  Administered 2014-04-07 (×4): 0.5 mg via INTRAVENOUS

## 2014-04-07 MED ORDER — METHYLPREDNISOLONE SODIUM SUCC 125 MG IJ SOLR
125.0000 mg | Freq: Once | INTRAMUSCULAR | Status: AC
  Administered 2014-04-07: 125 mg via INTRAVENOUS
  Filled 2014-04-07: qty 2

## 2014-04-07 MED ORDER — BUPIVACAINE HCL (PF) 0.25 % IJ SOLN
INTRAMUSCULAR | Status: DC | PRN
  Administered 2014-04-07: 30 mL

## 2014-04-07 MED ORDER — ARTIFICIAL TEARS OP OINT
TOPICAL_OINTMENT | OPHTHALMIC | Status: AC
  Filled 2014-04-07: qty 3.5

## 2014-04-07 MED ORDER — ARTIFICIAL TEARS OP OINT
TOPICAL_OINTMENT | OPHTHALMIC | Status: DC | PRN
  Administered 2014-04-07: 1 via OPHTHALMIC

## 2014-04-07 MED ORDER — LABETALOL HCL 5 MG/ML IV SOLN
5.0000 mg | INTRAVENOUS | Status: DC | PRN
  Filled 2014-04-07: qty 4

## 2014-04-07 MED ORDER — ONDANSETRON HCL 4 MG/2ML IJ SOLN
INTRAMUSCULAR | Status: AC
  Filled 2014-04-07: qty 2

## 2014-04-07 MED ORDER — MIDAZOLAM HCL 5 MG/5ML IJ SOLN
INTRAMUSCULAR | Status: DC | PRN
Start: 2014-04-07 — End: 2014-04-07
  Administered 2014-04-07: 2 mg via INTRAVENOUS

## 2014-04-07 MED ORDER — HYDROMORPHONE HCL PF 1 MG/ML IJ SOLN
1.0000 mg | Freq: Once | INTRAMUSCULAR | Status: AC
Start: 2014-04-07 — End: 2014-04-07
  Administered 2014-04-07: 1 mg via INTRAVENOUS
  Filled 2014-04-07: qty 1

## 2014-04-07 MED ORDER — MIDAZOLAM HCL 2 MG/2ML IJ SOLN
INTRAMUSCULAR | Status: AC
  Filled 2014-04-07: qty 2

## 2014-04-07 MED ORDER — LIDOCAINE HCL (CARDIAC) 20 MG/ML IV SOLN
INTRAVENOUS | Status: AC
  Filled 2014-04-07: qty 5

## 2014-04-07 MED ORDER — HYDROCODONE-ACETAMINOPHEN 5-325 MG PO TABS: 1.0000 | ORAL_TABLET | ORAL | Status: DC | PRN

## 2014-04-07 MED ORDER — 0.9 % SODIUM CHLORIDE (POUR BTL) OPTIME
TOPICAL | Status: DC | PRN
  Administered 2014-04-07: 1000 mL

## 2014-04-07 MED ORDER — PROPOFOL 10 MG/ML IV BOLUS
INTRAVENOUS | Status: AC
  Filled 2014-04-07: qty 20

## 2014-04-07 MED ORDER — DIPHENHYDRAMINE HCL 50 MG/ML IJ SOLN
25.0000 mg | Freq: Four times a day (QID) | INTRAMUSCULAR | Status: DC
  Administered 2014-04-07 – 2014-04-09 (×6): 25 mg via INTRAVENOUS
  Filled 2014-04-07 (×2): qty 0.5
  Filled 2014-04-07: qty 1
  Filled 2014-04-07 (×2): qty 0.5
  Filled 2014-04-07: qty 1
  Filled 2014-04-07 (×2): qty 0.5
  Filled 2014-04-07: qty 1
  Filled 2014-04-07: qty 0.5
  Filled 2014-04-07 (×2): qty 1
  Filled 2014-04-07: qty 0.5

## 2014-04-07 MED ORDER — ALPRAZOLAM 0.25 MG PO TABS: 0.2500 mg | ORAL_TABLET | Freq: Every evening | ORAL | Status: DC | PRN

## 2014-04-07 MED ORDER — LACTATED RINGERS IV SOLN
INTRAVENOUS | Status: DC
  Administered 2014-04-07: 09:00:00 via INTRAVENOUS

## 2014-04-07 SURGICAL SUPPLY — 51 items
APL SKNCLS STERI-STRIP NONHPOA (GAUZE/BANDAGES/DRESSINGS) ×1
BENZOIN TINCTURE PRP APPL 2/3 (GAUZE/BANDAGES/DRESSINGS) ×3 IMPLANT
BINDER BREAST LRG (GAUZE/BANDAGES/DRESSINGS) IMPLANT
BINDER BREAST XLRG (GAUZE/BANDAGES/DRESSINGS) ×2 IMPLANT
BINDER BREAST XXLRG (GAUZE/BANDAGES/DRESSINGS) ×2 IMPLANT
BLADE SURG 10 STRL SS (BLADE) ×2 IMPLANT
BLADE SURG 15 STRL LF DISP TIS (BLADE) IMPLANT
BLADE SURG 15 STRL SS (BLADE) ×3
CANISTER SUCTION 2500CC (MISCELLANEOUS) ×3 IMPLANT
CHLORAPREP W/TINT 26ML (MISCELLANEOUS) ×3 IMPLANT
CLOSURE WOUND 1/2 X4 (GAUZE/BANDAGES/DRESSINGS) ×1
CONT SPEC 4OZ CLIKSEAL STRL BL (MISCELLANEOUS) ×4 IMPLANT
COVER SURGICAL LIGHT HANDLE (MISCELLANEOUS) ×3 IMPLANT
DECANTER SPIKE VIAL GLASS SM (MISCELLANEOUS) ×3 IMPLANT
DEVICE DUBIN SPECIMEN MAMMOGRA (MISCELLANEOUS) ×3 IMPLANT
DRAPE CHEST BREAST 15X10 FENES (DRAPES) ×3 IMPLANT
DRAPE UTILITY 15X26 W/TAPE STR (DRAPE) ×6 IMPLANT
ELECT CAUTERY BLADE 6.4 (BLADE) ×3 IMPLANT
ELECT REM PT RETURN 9FT ADLT (ELECTROSURGICAL) ×3
ELECTRODE REM PT RTRN 9FT ADLT (ELECTROSURGICAL) ×1 IMPLANT
GLOVE BIOGEL PI IND STRL 7.0 (GLOVE) IMPLANT
GLOVE BIOGEL PI IND STRL 8 (GLOVE) ×1 IMPLANT
GLOVE BIOGEL PI INDICATOR 7.0 (GLOVE) ×4
GLOVE BIOGEL PI INDICATOR 8 (GLOVE) ×2
GLOVE ECLIPSE 7.0 STRL STRAW (GLOVE) ×2 IMPLANT
GLOVE ECLIPSE 8.0 STRL XLNG CF (GLOVE) ×3 IMPLANT
GLOVE SS BIOGEL STRL SZ 6.5 (GLOVE) IMPLANT
GLOVE SUPERSENSE BIOGEL SZ 6.5 (GLOVE) ×2
GOWN STRL REUS W/ TWL LRG LVL3 (GOWN DISPOSABLE) ×2 IMPLANT
GOWN STRL REUS W/TWL LRG LVL3 (GOWN DISPOSABLE) ×6
KIT BASIN OR (CUSTOM PROCEDURE TRAY) ×3 IMPLANT
KIT ROOM TURNOVER OR (KITS) ×3 IMPLANT
NDL HYPO 25GX1X1/2 BEV (NEEDLE) ×1 IMPLANT
NEEDLE HYPO 25GX1X1/2 BEV (NEEDLE) ×3 IMPLANT
NS IRRIG 1000ML POUR BTL (IV SOLUTION) ×3 IMPLANT
PACK SURGICAL SETUP 50X90 (CUSTOM PROCEDURE TRAY) ×3 IMPLANT
PAD ARMBOARD 7.5X6 YLW CONV (MISCELLANEOUS) ×3 IMPLANT
PENCIL BUTTON HOLSTER BLD 10FT (ELECTRODE) ×3 IMPLANT
SPONGE GAUZE 4X4 12PLY (GAUZE/BANDAGES/DRESSINGS) ×3 IMPLANT
SPONGE LAP 4X18 X RAY DECT (DISPOSABLE) ×5 IMPLANT
STRIP CLOSURE SKIN 1/2X4 (GAUZE/BANDAGES/DRESSINGS) ×2 IMPLANT
SUT MNCRL AB 4-0 PS2 18 (SUTURE) ×3 IMPLANT
SUT VIC AB 3-0 SH 18 (SUTURE) ×3 IMPLANT
SYR BULB IRRIGATION 50ML (SYRINGE) ×2 IMPLANT
SYR CONTROL 10ML LL (SYRINGE) ×3 IMPLANT
TAPE CLOTH SURG 4X10 WHT LF (GAUZE/BANDAGES/DRESSINGS) ×2 IMPLANT
TOWEL OR 17X24 6PK STRL BLUE (TOWEL DISPOSABLE) ×3 IMPLANT
TOWEL OR 17X26 10 PK STRL BLUE (TOWEL DISPOSABLE) ×3 IMPLANT
TUBE CONNECTING 12'X1/4 (SUCTIONS) ×1
TUBE CONNECTING 12X1/4 (SUCTIONS) ×2 IMPLANT
YANKAUER SUCT BULB TIP NO VENT (SUCTIONS) ×3 IMPLANT

## 2014-04-07 NOTE — Consults (Signed)
  Anesthesia Pain Consult Note  [name], 48 y.o., female  Consult Requested by: No att. providers found  Reason for Consult: Swelling of upper lip s/p surgery and anesthesia earlier today.  Level of Consciousness: alert  Pain: mild   Pt comes to ER due to increasing swelling of the upper lip s/p right breast surgery earlier today.  The swelling was present at discharge from surgical post-op, but has increased to the point that she is quite worried.  There is no evidence of trauma to the upper lip.  However, it is full and the skin is tight.  The swelling seems isolated to the upper lip and maxillary regions of bilateral cheeks.  She is able to open her mouth and the tongue and oropharynx appears normal.  She states that she can breath easily and appears unlabored.    A/P: Possible angioedema of the upper lip.  No airway compromise at this time.  I recommend keeping the patient in the ER for monitoring while treating with an angioedema protocol.       Allergies  Allergen Reactions  . Penicillins Itching and Swelling    Physical exam: PULM clear to auscultation  CARDIO Heart sounds are normal.  Regular rate and rhythm without murmur, gallop or rub.  OTHER    I have reviewed the patient's medications listed below.       Past Medical History  Diagnosis Date  . Tobacco use disorder   . Mild hypertension   . Hypercholesterolemia   . Obesity   . Breast mass, right   . DJD (degenerative joint disease)   . Back pain with radiation   . Headache(784.0)   . Anxiety   . Groin abscess   . IBS (irritable bowel syndrome)   . History of IBS   . Lactose intolerance    Past Surgical History  Procedure Laterality Date  . Tubal ligation    . Lumbar disc surgery    . Finger surgery      Right index-excision of mass   . Breast lumpectomy    . Foot surgery    . Laparoscopic hysterectomy  09/03/2012    Procedure: HYSTERECTOMY TOTAL LAPAROSCOPIC;  Surgeon: [name], MD;  Location: [facility];  Service: Gynecology;  Laterality: N/A;  . Bilateral salpingectomy  09/03/2012    Procedure: BILATERAL SALPINGECTOMY;  Surgeon: [name], MD;  Location: [facility];  Service: Gynecology;  Laterality: Bilateral;  . Esophagogastroduodenoscopy    . Colonoscopy w/ biopsies    . Back surgery    . Abdominal hysterectomy      reports that she has been smoking Cigarettes.  She has been smoking about 0.00 packs per day. She has never used smokeless tobacco. She reports that she does not use illicit drugs. Her alcohol history is not on file.    Dana [name] S 04/07/2014

## 2014-04-07 NOTE — H&P (Signed)
H & P  She had bilateral clear nipple discharge. She was noted to have a 5 mm mass in the subareolar 6:00 position of the right breast. Image guided biopsy was performed. Pathology was consistent with a fibroadenoma. It was incompletely removed. She was not comfortable leaving the mass in her breast and requested referral to a surgeon to discuss removal. There is a history of breast cancer in a maternal aunt. She has had a previous right breast biopsy that was benign.  Past Medical History   Diagnosis  Date   .  Tobacco use disorder    .  Mild hypertension    .  Hypercholesterolemia    .  Obesity    .  Breast mass, right    .  DJD (degenerative joint disease)    .  Back pain with radiation    .  Headache(784.0)    .  Anxiety    .  Groin abscess    .  IBS (irritable bowel syndrome)     Past Surgical History   Procedure  Laterality  Date   .  Tubal ligation     .  Lumbar disc surgery     .  Finger surgery       Right index-excision of mass   .  Breast lumpectomy     .  Foot surgery     .  Laparoscopic hysterectomy   09/03/2012     Procedure: HYSTERECTOMY TOTAL LAPAROSCOPIC; Surgeon: [name], MD; Location: [facility]; Service: Gynecology; Laterality: N/A;   .  Bilateral salpingectomy   09/03/2012     Procedure: BILATERAL SALPINGECTOMY; Surgeon: [name], MD; Location: [name]; Service: Gynecology; Laterality: Bilateral;   .  Esophagogastroduodenoscopy     .  Colonoscopy w/ biopsies      Family History   Problem  Relation  Age of Onset   .  Diabetes  Father    .  Diabetes  Mother    .  Prostate cancer  Paternal Grandfather    .  Prostate cancer  Paternal Grandfather    .  Breast cancer  Maternal Aunt  62   Social History  History   Substance Use Topics   .  Smoking status:  Current Every Day Smoker     Types:  Cigarettes   .  Smokeless tobacco:  Never Used      Comment: smokes 1 cig daily   .  Alcohol Use:  7.0 oz/week     14 drink(s) per week      Comment:  social use    Allergies   Allergen  Reactions   .  Penicillins  Itching and Swelling    Current Outpatient Prescriptions   Medication  Sig  Dispense  Refill   .  alosetron (LOTRONEX) 0.5 MG tablet  Take 1 tablet (0.5 mg total) by mouth 2 (two) times daily.  60 tablet  1   .  ALPRAZolam (XANAX) 0.5 MG tablet  1/2 to 1 tablet three times daily as needed for nerves  90 tablet  5   .  hydrochlorothiazide (MICROZIDE) 12.5 MG capsule  TAKE ONE CAPSULE BY MOUTH EVERY DAY  30 capsule  11   .  HYDROcodone-acetaminophen (NORCO/VICODIN) 5-325 MG per tablet  Take 1 tablet by mouth every 6 (six) hours as needed for pain.  10 tablet  0   .  lisinopril (PRINIVIL,ZESTRIL) 20 MG tablet  Take 1 tablet (20 mg total) by mouth daily.  30 tablet  11   .  pantoprazole (PROTONIX) 40 MG tablet  Take 1 tablet (40 mg total) by mouth daily. 30 minutes before the first meal of the day  30 tablet  11   .  rosuvastatin (CRESTOR) 10 MG tablet  Take 1 tablet (10 mg total) by mouth daily.  30 tablet  11   .  simvastatin (ZOCOR) 40 MG tablet  Take 1 tablet (40 mg total) by mouth at bedtime.  90 tablet  3    No current facility-administered medications for this visit.   Review of Systems  Review of Systems  All other systems reviewed and are negative.  Blood pressure 134/82, pulse 80, temperature 98 F (36.7 C), resp. rate 18, height 5\' 3"  (1.6 m), weight 243 lb (110.224 kg), last menstrual period 08/17/2012.  Physical Exam  Physical Exam  Constitutional: No distress.  Overweight  HENT:  Head: Normocephalic and atraumatic.  Eyes: No scleral icterus.  Neck: Neck supple.  Pulmonary/Chest:  Right breast-inferior scar present. No obvious palpable mass in the subareolar position 6:00 area.  Left breast-no palpable mass or suspicious skin change.   Musculoskeletal:  No palpable axillary or supraclavicular adenopathy.  Lymphadenopathy:  She has no cervical adenopathy.  Data Reviewed  Mammogram and mammogram report.  Ultrasound report. Pathology results.  Assessment  Right breast fibroadenoma-she is not comfortable leaving this in her breast and would like to have it removed.   Plan  Removal of right breast fibroadenoma after wire localization. The procedure and risks were discussed with her. Risks include but are not limited to bleeding, infection, wound healing problems, anesthesia. She seems to understand all this and would like to proceed.  Elisha Mcgruder Lenna Sciara

## 2014-04-07 NOTE — Telephone Encounter (Signed)
Patient's daughter called in explaining that her mother's lip has swollen 2x more than it already was and it is now traveling to her nose.  It is compromising her breathing and her speech.  Informed her that they need to go to the [facility] ER.  I paged Dr. Zella Richer and he agreed with this POc and he explained that he would consult over the anesthesiologist.

## 2014-04-07 NOTE — ED Notes (Signed)
Pt tolerating oral fluids well 

## 2014-04-07 NOTE — ED Notes (Signed)
Attempted report X1

## 2014-04-07 NOTE — Progress Notes (Signed)
Pt arrived to floor via stretcher. Pt was oriented to room & call bell system. VS have been taken. Pt in no apparent distress at this time. Admitting provider has been notified, & Safety video has been viewed

## 2014-04-07 NOTE — Progress Notes (Addendum)
Events noted.  I have seen and examined Mrs. Dana [name] and spoken with her and Mr. Peets.  Appreciate Internal Medicine's assistance with the treatment of her angioedema of the lip.  Will add Benadryl and Pepcid.

## 2014-04-07 NOTE — Anesthesia Postprocedure Evaluation (Signed)
  Anesthesia Post-op Note  [name]  Procedure(s) Performed: Procedure(s): REMOVAL RIGHT BREAST MASS WITH WIRE LOCALIZATION (Right)  Patient Location: PACU  Anesthesia Type:General  Level of Consciousness: awake, alert  and oriented  Airway and Oxygen Therapy: Patient Spontanous Breathing and Patient connected to nasal cannula oxygen  Post-op Pain: mild  Post-op Assessment: Post-op Vital signs reviewed, Patient's Cardiovascular Status Stable, Respiratory Function Stable, Patent Airway and Pain level controlled  Post-op Vital Signs: stable  Last Vitals:  Filed Vitals:   04/07/14 1245  BP: 166/87  Pulse: 76  Temp:   Resp: 18    Complications: No apparent anesthesia complications

## 2014-04-07 NOTE — ED Notes (Addendum)
Pt to ED for evaluation of upper lip swelling.  Pt had a right partial mastectomy earlier today and was intubated for procedure and discharged at 130pm.  Pt noted to have significant swelling to upper lip and hoarse voice- pt speaking in full sentences and no respiratory distress noted at present.  Anaesthesiologist present upon pt arrival to exam room.  Dr. Regenia Skeeter at bedside upon arrival to exam room.

## 2014-04-07 NOTE — Telephone Encounter (Signed)
Pt s/p right breast lumpectomy. Pt states that during her sx she was told that they cut her lip while they were intubating her. Pts husband states that her throat is not swollen it is just her lip. Advised pt to hold pressure and continue to ice the area. Husband states they have been doing this since they arrived home with no relief, he feels that the area is getting larger. Informed pt that I would send Dr Zella Richer a message for further recommendations and we would contact them as soon as we receive a response. Pt verbalized understanding

## 2014-04-07 NOTE — Transfer of Care (Signed)
Immediate Anesthesia Transfer of Care Note  [name]  Procedure(s) Performed: Procedure(s): REMOVAL RIGHT BREAST MASS WITH WIRE LOCALIZATION (Right)  Patient Location: PACU  Anesthesia Type:General  Level of Consciousness: awake, alert , oriented and sedated  Airway & Oxygen Therapy: Patient Spontanous Breathing and Patient connected to nasal cannula oxygen  Post-op Assessment: Report given to PACU RN, Post -op Vital signs reviewed and stable and Patient moving all extremities  Post vital signs: Reviewed and stable  Complications: No apparent anesthesia complications

## 2014-04-07 NOTE — Interval H&P Note (Signed)
History and Physical Interval Note:  04/07/2014 9:14 AM  Dana [name]  has presented today for surgery, with the diagnosis of Right breast fibroadenoma  The various methods of treatment have been discussed with the patient and family. After consideration of risks, benefits and other options for treatment, the patient has consented to  Procedure(s): REMOVAL RIGHT BREAST MASS WITH WIRE LOCALIZATION (Right) as a surgical intervention .  The patient's history has been reviewed, patient examined, no change in status, stable for surgery.  I have reviewed the patient's chart and labs.  Questions were answered to the patient's satisfaction.     Euclide Granito Lenna Sciara

## 2014-04-07 NOTE — Op Note (Signed)
Operative Note  Dana [name] female 48 y.o. 04/07/2014  PREOPERATIVE DX:  Right breast fibroadenoma  POSTOPERATIVE DX:  Same  PROCEDURE:  Right breast lumpectomy after wire localization         Surgeon: [name]   Assistants: none  Anesthesia: General LMA anesthesia  Indications:  This is a 48 year old female who is noted to have a 5 mm mass in the subareolar 6:00 position the right breast during imaging. Image guided biopsy demonstrated a fibroadenoma. It was incompletely removed. She was not comfortable leaving the mass in her breast and now presents for the above procedure.    Procedure Detail:  She had successful wire localization at the breast center. She was seen in the holding area in the right breast mark my initials. She is brought to the operating room placed upon the operating table and a general anesthetic was given. The bandage on the right breast was removed and the wire was cut closer to the skin. The wire and right breast were sterilely prepped and draped.  In the circumareolar incision from the 8:30 o'clock and 9 clock position Marcaine was infiltrated into the subcutaneous tissues. A circumareolar incision from 3:00 to 9:00 was made through the skin and subcutaneous tissue. The mass was approximately 6 cm deep to the skin. I divided the subcutaneous tissue. I then removed a lump of breast tissue around the tip of the wire after the wire had been brought into the wound. Once this was removed the specimen mammogram was performed. The clip area appeared to be in the specimen but were close to one of the margins. I felt that this was either the medial or lateral margin. I shaved off more tissue from the lateral and medial margin using electrocautery. This was sent separately. Specimen mammogram was also read by the radiologist who felt the area of concern was present in the specimen.  Wound was inspected and bleeding was controlled with electrocautery. Marcaine  solution was infiltrated into the tissues of the wound for local anesthetic effect. Once hemostasis was adequate, the subcutaneous tissues were closed in layers with interrupted 3-0 Vicryl suture. The skin was closed with a running 4-0 Monocryl subcuticular stitch. Steri-Strips and sterile dressings were applied.  She tolerated the procedure well without apparent complications and was taken to the recovery room in satisfactory condition.  Estimated Blood Loss:  less than 100 mL         Drains: none  Blood Given: none          Specimens: right breast tissue        Complications:  * No complications entered in OR log *         Disposition: PACU - hemodynamically stable.         Condition: stable

## 2014-04-07 NOTE — Anesthesia Procedure Notes (Signed)
Procedure Name: LMA Insertion Date/Time: 04/07/2014 9:42 AM Performed by: Scheryl Darter Pre-anesthesia Checklist: Patient identified, Emergency Drugs available, Suction available, Timeout performed and Patient being monitored Patient Re-evaluated:Patient Re-evaluated prior to inductionOxygen Delivery Method: Circle system utilized Preoxygenation: Pre-oxygenation with 100% oxygen Intubation Type: IV induction LMA: LMA inserted LMA Size: 4.0 Number of attempts: 1 Placement Confirmation: positive ETCO2 and breath sounds checked- equal and bilateral Tube secured with: Tape

## 2014-04-07 NOTE — ED Provider Notes (Signed)
CSN: [id number]     Arrival date & time 04/07/14  1748 History   First MD Initiated Contact with Patient 04/07/14 1758     Chief Complaint  Patient presents with  . Oral Swelling     (Consider location/radiation/quality/duration/timing/severity/associated sxs/prior Treatment) HPI 48 year old female presents with gradually worsening upper lip swelling over past few hours. Started after she woke up from anesthesia after having right breast mass biopsied and partial lumpectomy. Has a raspy voice but had an LMA during surgery. No throat closing symptoms, dyspnea or rash. Is on lisinopril as one of her BP meds. Received at least fentanyl, versed, propofol and unk antibiotic during procedure today.   Past Medical History  Diagnosis Date  . Tobacco use disorder   . Mild hypertension   . Hypercholesterolemia   . Obesity   . Breast mass, right   . DJD (degenerative joint disease)   . Back pain with radiation   . Headache(784.0)   . Anxiety   . Groin abscess   . IBS (irritable bowel syndrome)   . History of IBS   . Lactose intolerance    Past Surgical History  Procedure Laterality Date  . Tubal ligation    . Lumbar disc surgery    . Finger surgery      Right index-excision of mass   . Breast lumpectomy    . Foot surgery    . Laparoscopic hysterectomy  09/03/2012    Procedure: HYSTERECTOMY TOTAL LAPAROSCOPIC;  Surgeon: [name], MD;  Location: [facility];  Service: Gynecology;  Laterality: N/A;  . Bilateral salpingectomy  09/03/2012    Procedure: BILATERAL SALPINGECTOMY;  Surgeon: [name], MD;  Location: [facility];  Service: Gynecology;  Laterality: Bilateral;  . Esophagogastroduodenoscopy    . Colonoscopy w/ biopsies    . Back surgery    . Abdominal hysterectomy     Family History  Problem Relation Age of Onset  . Diabetes Father   . Diabetes Mother   . Prostate cancer Paternal Grandfather   . Prostate cancer Paternal Grandfather   . Breast cancer Maternal Aunt 46    History  Substance Use Topics  . Smoking status: Current Every Day Smoker    Types: Cigarettes  . Smokeless tobacco: Never Used     Comment: smokes 1 cig daily  . Alcohol Use: Not on file     Comment: social use   OB History   Grav Para Term Preterm Abortions TAB SAB Ect Mult Living   _0 Review of Systems  HENT: Positive for facial swelling and voice change. Negative for trouble swallowing.   Respiratory: Negative for shortness of breath and wheezing.   All other systems reviewed and are negative.     Allergies  Penicillins  Home Medications   Prior to Admission medications   Medication Sig Start Date End Date Taking? Authorizing Provider  ALPRAZolam [name]) 0.5 MG tablet TAKE A HALF TO ONE TABLET BY MOUTH 3 TIMES A DAY AS NEEDED FOR NERVES 03/30/14   [name], MD  hydrochlorothiazide (MICROZIDE) 12.5 MG capsule TAKE ONE CAPSULE BY MOUTH EVERY DAY 09/13/13   Noralee Space, MD  HYDROcodone-acetaminophen (NORCO/VICODIN) 5-325 MG per tablet Take 1 tablet by mouth every 6 (six) hours as needed for pain. 11/02/12   [name], MD  HYDROcodone-acetaminophen (NORCO/VICODIN) 5-325 MG per tablet Take 1-2 tablets by mouth every 4 (four) hours  as needed for moderate pain or severe pain. 04/07/14   [name], MD  lisinopril (PRINIVIL,ZESTRIL) 20 MG tablet Take 1 tablet (20 mg total) by mouth daily. 09/13/13   Noralee Space, MD   BP 153/66  Pulse 68  Temp(Src) 97.7 F (36.5 C) (Oral)  Resp 18  SpO2 100%  LMP 08/17/2012 Physical Exam  Nursing note and vitals reviewed. Constitutional: She is oriented to person, place, and time. She appears well-developed and well-nourished.  HENT:  Head: Normocephalic and atraumatic.  Right Ear: External ear normal.  Left Ear: External ear normal.  Nose: Nose normal.  Mouth/Throat: Uvula is midline. No uvula swelling.  Diffuse upper lip swelling. No tongue swelling  Eyes: Right eye exhibits no discharge. Left  eye exhibits no discharge.  Cardiovascular: Normal rate, regular rhythm and normal heart sounds.   Pulmonary/Chest: Effort normal and breath sounds normal. No stridor. She has no wheezes.  Abdominal: Soft. There is no tenderness.  Neurological: She is alert and oriented to person, place, and time.  Skin: Skin is warm and dry. No rash noted.    ED Course  Procedures (including critical care time) Labs Review Labs Reviewed - No data to display  Imaging Review Mm Breast Surgical Specimen  04/07/2014   CLINICAL DATA:  Needle localization performed for excisional biopsy of a fibroadenoma of the right breast.  EXAM: SPECIMEN RADIOGRAPH OF THE RIGHT BREAST  COMPARISON:  Previous exam(s)  FINDINGS: Status post excision of the right breast. The wire tip and biopsy marker clip and a small nodular density are present and are marked for pathology.  IMPRESSION: Specimen radiograph of the right breast.   Electronically Signed   By: Curlene Dolphin M.D.   On: 04/07/2014 13:44   Mm Rt Plc Breast Loc Dev   1st Lesion  Inc Mammo Guide  04/07/2014   CLINICAL DATA:  Needle localization of the right breast for surgical excision of a biopsy-proven fibroadenoma in the right breast. Patient requested excision of the mass.  EXAM: NEEDLE LOCALIZATION OF THE RIGHT BREAST WITH MAMMO GUIDANCE  COMPARISON:  Previous exams.  FINDINGS: Patient presents for needle localization prior to excisional biopsy. I met with the patient and we discussed the procedure of needle localization including benefits and alternatives. We discussed the high likelihood of a successful procedure. We discussed the risks of the procedure, including infection, bleeding, tissue injury, and further surgery. Informed, written consent was given. The usual time-out protocol was performed immediately prior to the procedure.  Using mammographic guidance, sterile technique, 2% lidocaine and a 7 cm modified Kopans needle, a coil shaped biopsy clip in the  retroareolar right breast was localized using lateral to medial approach. The films were marked for Dr. Zella Richer.  IMPRESSION: Needle localization right breast. No apparent complications.   Electronically Signed   By: Curlene Dolphin M.D.   On: 04/07/2014 09:14     EKG Interpretation None      MDM   Final diagnoses:  Angioedema, initial encounter    Patient with upper lip angioedema s/p surgery. Uncertain cause, but is on lisinopril. Took 50 mg benadryl prior to arrival. Given solumedrol here. No obvious airway involvement, but she feels her upper lip and face are still continuing to swell. Will admit for observation to hospitalist.     Ephraim Hamburger, MD 04/08/14 [phone]

## 2014-04-07 NOTE — Anesthesia Preprocedure Evaluation (Signed)
Anesthesia Evaluation  Patient identified by MRN, date of birth, ID band Patient awake    Reviewed: Allergy & Precautions, H&P , NPO status , Patient's Chart, lab work & pertinent test results  Airway Mallampati: II TM Distance: >3 FB Neck ROM: Full    Dental   Pulmonary Current Smoker,  breath sounds clear to auscultation        Cardiovascular hypertension, Rhythm:Regular Rate:Normal     Neuro/Psych    GI/Hepatic   Endo/Other    Renal/GU      Musculoskeletal   Abdominal   Peds  Hematology   Anesthesia Other Findings   Reproductive/Obstetrics                           Anesthesia Physical Anesthesia Plan  ASA: II  Anesthesia Plan: General   Post-op Pain Management:    Induction: Intravenous  Airway Management Planned: LMA  Additional Equipment:   Intra-op Plan:   Post-operative Plan:   Informed Consent: I have reviewed the patients History and Physical, chart, labs and discussed the procedure including the risks, benefits and alternatives for the proposed anesthesia with the patient or authorized representative who has indicated his/her understanding and acceptance.   Dental advisory given  Plan Discussed with: CRNA and Anesthesiologist  Anesthesia Plan Comments:         Anesthesia Quick Evaluation

## 2014-04-07 NOTE — H&P (Signed)
[facility] History and Physical  Dana [name]:[id number] DOB: May 06, 1966 DOA: 04/07/2014  Referring physician: EDP PCP: [name], MD   Chief Complaint: Lip swelling   HPI: Dana [name] is a 48 y.o. female who underwent lumpectomy for a fibroadenoma earlier today here at the hospital.  After the surgery she had acute onset of lip swelling when she woke up.  Per her and family they were told that her lip must have been "nicked" during intubation, they put ice on it and patient was discharged home.  After getting home, lip swelling continued to worsen and so patient was advised to come to ED.  Review of Systems: Lisinopril last taken last evening, Systems reviewed.  As above, otherwise negative  Past Medical History  Diagnosis Date  . Tobacco use disorder   . Mild hypertension   . Hypercholesterolemia   . Obesity   . Breast mass, right   . DJD (degenerative joint disease)   . Back pain with radiation   . Headache(784.0)   . Anxiety   . Groin abscess   . IBS (irritable bowel syndrome)   . History of IBS   . Lactose intolerance    Past Surgical History  Procedure Laterality Date  . Tubal ligation    . Lumbar disc surgery    . Finger surgery      Right index-excision of mass   . Breast lumpectomy    . Foot surgery    . Laparoscopic hysterectomy  09/03/2012    Procedure: HYSTERECTOMY TOTAL LAPAROSCOPIC;  Surgeon: [name], MD;  Location: [facility];  Service: Gynecology;  Laterality: N/A;  . Bilateral salpingectomy  09/03/2012    Procedure: BILATERAL SALPINGECTOMY;  Surgeon: [name], MD;  Location: [facility];  Service: Gynecology;  Laterality: Bilateral;  . Esophagogastroduodenoscopy    . Colonoscopy w/ biopsies    . Back surgery    . Abdominal hysterectomy     Social History:  reports that she has been smoking Cigarettes.  She has been smoking about 0.00 packs per day. She has never used smokeless tobacco. She reports that she does not use  illicit drugs. Her alcohol history is not on file.  Allergies  Allergen Reactions  . Penicillins Itching and Swelling    Family History  Problem Relation Age of Onset  . Diabetes Father   . Diabetes Mother   . Prostate cancer Paternal Grandfather   . Prostate cancer Paternal Grandfather   . Breast cancer Maternal Aunt 46     Prior to Admission medications   Medication Sig Start Date End Date Taking? Authorizing Provider  ALPRAZolam [name]) 0.5 MG tablet Take 0.25-0.5 mg by mouth at bedtime as needed for anxiety.   Yes Historical Provider, MD  hydrochlorothiazide (MICROZIDE) 12.5 MG capsule Take 12.5 mg by mouth daily.   Yes Historical Provider, MD  HYDROcodone-acetaminophen (NORCO/VICODIN) 5-325 MG per tablet Take 1-2 tablets by mouth every 4 (four) hours as needed for moderate pain or severe pain. 04/07/14  Yes [name], MD  lisinopril (PRINIVIL,ZESTRIL) 20 MG tablet Take 1 tablet (20 mg total) by mouth daily. 09/13/13  Yes Noralee Space, MD   Physical Exam: Filed Vitals:   04/07/14 2030  BP: 165/76  Pulse: 66  Temp:   Resp: 15    BP 165/76  Pulse 66  Temp(Src) 98.3 F (36.8 C) (Axillary)  Resp 15  SpO2 100%  LMP 08/17/2012  General Appearance:    Alert, oriented, no distress,  appears stated age  Head:    Normocephalic, atraumatic  Eyes:    PERRL, EOMI, sclera non-icteric        Nose:   Nares without drainage or epistaxis. Mucosa, turbinates normal  Throat:   Moist mucous membranes. Uniform upper lip swelling, there are no visible lesions, lacerations or otherwise on the lip or inside of mouth.  Neck:   Supple. No carotid bruits.  No thyromegaly.  No lymphadenopathy.   Back:     No CVA tenderness, no spinal tenderness  Lungs:     Clear to auscultation bilaterally, without wheezes, rhonchi or rales  Chest wall:    No tenderness to palpitation  Heart:    Regular rate and rhythm without murmurs, gallops, rubs  Abdomen:     Soft, non-tender, nondistended,  normal bowel sounds, no organomegaly  Genitalia:    deferred  Rectal:    deferred  Extremities:   No clubbing, cyanosis or edema.  Pulses:   2+ and symmetric all extremities  Skin:   Skin color, texture, turgor normal, no rashes or lesions  Lymph nodes:   Cervical, supraclavicular, and axillary nodes normal  Neurologic:   CNII-XII intact. Normal strength, sensation and reflexes      throughout    Labs on Admission:  Basic Metabolic Panel: No results found for this basename: NA, K, CL, CO2, GLUCOSE, BUN, CREATININE, CALCIUM, MG, PHOS,  in the last 168 hours Liver Function Tests: No results found for this basename: AST, ALT, ALKPHOS, BILITOT, PROT, ALBUMIN,  in the last 168 hours No results found for this basename: LIPASE, AMYLASE,  in the last 168 hours No results found for this basename: AMMONIA,  in the last 168 hours CBC: No results found for this basename: WBC, NEUTROABS, HGB, HCT, MCV, PLT,  in the last 168 hours Cardiac Enzymes: No results found for this basename: CKTOTAL, CKMB, CKMBINDEX, TROPONINI,  in the last 168 hours  BNP (last 3 results) No results found for this basename: PROBNP,  in the last 8760 hours CBG: No results found for this basename: GLUCAP,  in the last 168 hours  Radiological Exams on Admission: Mm Breast Surgical Specimen  04/07/2014   CLINICAL DATA:  Needle localization performed for excisional biopsy of a fibroadenoma of the right breast.  EXAM: SPECIMEN RADIOGRAPH OF THE RIGHT BREAST  COMPARISON:  Previous exam(s)  FINDINGS: Status post excision of the right breast. The wire tip and biopsy marker clip and a small nodular density are present and are marked for pathology.  IMPRESSION: Specimen radiograph of the right breast.   Electronically Signed   By: Curlene Dolphin M.D.   On: 04/07/2014 13:44   Mm Rt Plc Breast Loc Dev   1st Lesion  Inc Mammo Guide  04/07/2014   CLINICAL DATA:  Needle localization of the right breast for surgical excision of a  biopsy-proven fibroadenoma in the right breast. Patient requested excision of the mass.  EXAM: NEEDLE LOCALIZATION OF THE RIGHT BREAST WITH MAMMO GUIDANCE  COMPARISON:  Previous exams.  FINDINGS: Patient presents for needle localization prior to excisional biopsy. I met with the patient and we discussed the procedure of needle localization including benefits and alternatives. We discussed the high likelihood of a successful procedure. We discussed the risks of the procedure, including infection, bleeding, tissue injury, and further surgery. Informed, written consent was given. The usual time-out protocol was performed immediately prior to the procedure.  Using mammographic guidance, sterile technique, 2% lidocaine and a 7 cm  modified Kopans needle, a coil shaped biopsy clip in the retroareolar right breast was localized using lateral to medial approach. The films were marked for Dr. Zella Richer.  IMPRESSION: Needle localization right breast. No apparent complications.   Electronically Signed   By: Curlene Dolphin M.D.   On: 04/07/2014 09:14    EKG: Independently reviewed.  Assessment/Plan Principal Problem:   Angioedema of lips Active Problems:   HTN (hypertension)   1. Angioedema of upper lip - no evidence of airway compromise at this time, admitting patient for obs on continuous pulse ox.  Most likely secondary to one of the medications she was given during the surgical procedure which include vancomycin, fentanyl, bupivacaine, versed, propofol, rocuronium, lidocaine, and zofran (ephedrine and decadron also given but are highly unlikely candidates to cause this).  Less likely but also a possibility is her ACEi lisinopril, although the timing of onset immediately after surgery today seems to make this less likely (has been on lisinopril for years).  Never the less will hold lisinopril and use PRN labetalol for HTN control while in hospital.    Code Status: Full Code  Family Communication: Family at  bedside, they are upset with the fact that she was discharged with lip swelling earlier today and have asked to speak to risk management about this!  Have informed charge RN in ED who is going to call the AD on call. Disposition Plan: Admit to obs   Time spent: 70 min  Jerman Tinnon M. [facility] Pager [phone]  If 7AM-7PM, please contact the day team taking care of the patient [email or web address] Password [facility] 04/07/2014, 9:08 PM

## 2014-04-07 NOTE — ED Notes (Signed)
Dr. Goldston at bedside at this time.  

## 2014-04-07 NOTE — ED Notes (Signed)
Hospitalist at bedside 

## 2014-04-07 NOTE — Discharge Instructions (Addendum)
[place] Office Phone Number [phone]  BREAST BIOPSY/ PARTIAL MASTECTOMY: POST OP INSTRUCTIONS  Always review your discharge instruction sheet given to you by the facility where your surgery was performed.  IF YOU HAVE DISABILITY OR FAMILY LEAVE FORMS, YOU MUST BRING THEM TO THE OFFICE FOR PROCESSING.  DO NOT GIVE THEM TO YOUR DOCTOR.  1. A prescription for pain medication may be given to you upon discharge.  Take your pain medication as prescribed, if needed.  If narcotic pain medicine is not needed, then you may take acetaminophen (Tylenol) or ibuprofen (Advil) as needed. 2. Take your usually prescribed medications unless otherwise directed 3. If you need a refill on your pain medication, please contact your pharmacy.  They will contact our office to request authorization.  Prescriptions will not be filled after 5pm or on week-ends. 4. You should eat very light the first 24 hours after surgery, such as soup, crackers, pudding, etc.  Resume your normal diet the day after surgery. 5. Most patients will experience some swelling and bruising in the breast.  Ice packs and a good support bra will help.  Swelling and bruising can take several days to resolve.  6. It is common to experience some constipation if taking pain medication after surgery.  Increasing fluid intake and taking a stool softener will usually help or prevent this problem from occurring.  A mild laxative (Milk of Magnesia or Miralax) should be taken according to package directions if there are no bowel movements after 48 hours. 7. Unless discharge instructions indicate otherwise, you may remove your bandages 48 hours after surgery, and you may shower at that time.  You may have steri-strips (small skin tapes) in place directly over the incision.  These strips should be left on the skin.  If your surgeon used skin glue on the incision, you may shower in 24 hours.  The glue will flake off over the next 2-3 weeks.   Any sutures or staples will be removed at the office during your follow-up visit. 8. ACTIVITIES:  You may resume regular daily activities (gradually increasing) beginning in two to three days when you are comfortable..  Wearing a good support bra or sports bra minimizes pain and swelling.  You may have sexual intercourse when it is comfortable. a. You may drive when you no longer are taking prescription pain medication, you can comfortably wear a seatbelt, and you can safely maneuver your car and apply brakes. b. RETURN TO WORK:  In 3-7 days, light work at first (20 pound weight limit) for 5-7 days.______________________________________________________________________________________ 9. You should see your doctor in the office for a follow-up appointment approximately two weeks after your surgery.  Your doctors nurse will typically make your follow-up appointment when she calls you with your pathology report.  Expect your pathology report 2-3 business days after your surgery.  You may call to check if you do not hear from [place] after three days. 10. OTHER INSTRUCTIONS: _______________________________________________________________________________________________ _____________________________________________________________________________________________________________________________________ _____________________________________________________________________________________________________________________________________ _____________________________________________________________________________________________________________________________________  WHEN TO CALL YOUR DOCTOR: 1. Fever over 101.0 2. Nausea and/or vomiting. 3. Extreme swelling or bruising. 4. Continued bleeding from incision. 5. Increased pain, redness, or drainage from the incision.  The clinic staff is available to answer your questions during regular business hours.  Please dont hesitate to call and ask to speak to one of the  nurses for clinical concerns.  If you have a medical emergency, go to the nearest emergency room or call 911.  A Psychologist, sport and exercise from [facility] is always on call at the hospital.  For further questions, please visit [email or web address]   General Anesthesia, Adult, Care After  Refer to this sheet in the next few weeks. These instructions provide you with information on caring for yourself after your procedure. Your health care provider may also give you more specific instructions. Your treatment has been planned according to current medical practices, but problems sometimes occur. Call your health care provider if you have any problems or questions after your procedure.  WHAT TO EXPECT AFTER THE PROCEDURE  After the procedure, it is typical to experience:  Sleepiness.  Nausea and vomiting. HOME CARE INSTRUCTIONS  For the first 24 hours after general anesthesia:  Have a responsible person with you.  Do not drive a car. If you are alone, do not take public transportation.  Do not drink alcohol.  Do not take medicine that has not been prescribed by your health care provider.  Do not sign important papers or make important decisions.  You may resume a normal diet and activities as directed by your health care provider.  Change bandages (dressings) as directed.  If you have questions or problems that seem related to general anesthesia, call the hospital and ask for the anesthetist or anesthesiologist on call. SEEK MEDICAL CARE IF:  You have nausea and vomiting that continue the day after anesthesia.  You develop a rash. SEEK IMMEDIATE MEDICAL CARE IF:  You have difficulty breathing.  You have chest pain.  You have any allergic problems. Document Released: 12/09/2000 Document Revised: 05/05/2013 Document Reviewed: 03/18/2013  [facility] Patient Information 2014 [place], [place].

## 2014-04-07 NOTE — Telephone Encounter (Signed)
Spoke with Anesthesiologist at [facility] and requested they evaluate her in ED.

## 2014-04-08 ENCOUNTER — Telehealth: Payer: Self-pay | Admitting: Family Medicine

## 2014-04-08 ENCOUNTER — Encounter (HOSPITAL_COMMUNITY): Payer: Self-pay | Admitting: General Surgery

## 2014-04-08 DIAGNOSIS — F17200 Nicotine dependence, unspecified, uncomplicated: Secondary | ICD-10-CM

## 2014-04-08 MED ORDER — AMLODIPINE BESYLATE 5 MG PO TABS: 5.0000 mg | ORAL_TABLET | Freq: Every day | ORAL | Status: DC

## 2014-04-08 MED ORDER — METHYLPREDNISOLONE SODIUM SUCC 125 MG IJ SOLR
80.0000 mg | Freq: Two times a day (BID) | INTRAMUSCULAR | Status: DC
  Administered 2014-04-08 – 2014-04-09 (×2): 80 mg via INTRAVENOUS
  Filled 2014-04-08 (×4): qty 1.28

## 2014-04-08 MED ORDER — FAMOTIDINE 20 MG PO TABS
20.0000 mg | ORAL_TABLET | Freq: Two times a day (BID) | ORAL | Status: DC
  Administered 2014-04-08 – 2014-04-09 (×2): 20 mg via ORAL
  Filled 2014-04-08 (×3): qty 1

## 2014-04-08 NOTE — Telephone Encounter (Signed)
This is a duplicate note, see previous one with same date. 

## 2014-04-08 NOTE — Telephone Encounter (Signed)
I discontinued the Lisinopril in chart, sent new script e-scribe, spoke with pt's spouse, checked with pharmacy and script is ready for pick up.

## 2014-04-08 NOTE — Telephone Encounter (Signed)
Stop the Lisinopril and start her on Amlodipine 5 mg daily. Call in 90 day supply and have her see [place] in 3 weeks

## 2014-04-08 NOTE — Telephone Encounter (Signed)
Pt had a reaction to Lisinopril, she will be coming home from hospital today. Can you change medication?

## 2014-04-08 NOTE — Progress Notes (Signed)
Anesthesiology Follow-up:  Events noted, patient readmitted last night with upper lip and facial swelling  After R. Breast lumpectomy done under general anesthesia with LMA. Pt received 125 mg IV solumedrol in ER on benadryl and pepcid.  Exam: anxious AA female with upper lip and mid facial edema. No tongue swelling appreciated.  VS: T- 36.8 BP- 154/91 RR- 18 HR- 69 O2 Sat 98%on RA  Lungs- clear no wheezing Heart RRR no murmurs  Impression: Post-op upper lip and facial swelling in 49 year old female on lisinopril for hypertension. Suspect angioedema to ACE inhibitor.   Plan  1. 125 mg IV solumedrol now 2. Discuss changing anti-hypertensive med with Dr. Sarajane Jews (primary MD)

## 2014-04-08 NOTE — Progress Notes (Signed)
Dr Alcario Drought paged regarding Pt's blood pressure (168/104 mm of hg). Pt is Just given a dose of 12.5 mg HCTZ. No new order given at this time.will cont to monitor.

## 2014-04-08 NOTE — Addendum Note (Signed)
Addendum created 04/08/14 0817 by Roberts Gaudy, MD   Modules edited: Clinical Notes, Orders   Clinical Notes:  File: [id number]; Pend: [id number]; Pend: [id number]

## 2014-04-08 NOTE — Progress Notes (Addendum)
PATIENT DETAILS Name: Dana [name] Age: 48 y.o. Sex: female Date of Birth: Nov 16, 1965 Admit Date: 04/07/2014 Admitting Physician Etta Quill, DO PCP:FRY,Kerstyn Coryell A, MD  Subjective: Patient still with significant swelling of face and lips. No airway compromise; no difficulty in breathing; able to swallow.   Assessment/Plan:  Angioedema: - significant swelling of face and lips s/p lumpectomy on 7/23 - believe to be secondary to lisinopril; lisinopril was discontinued  - Patient able to swallow without difficulty.  No complaints of difficulty breathing. - angioedema tx with Solu-Med, benadryl, and pepcid  Hypertension: - started on labetalol and HCTZ for HTN in hospital after d/c of lisinopril  - BP stable.  Principal Problem:   Angioedema of lips Active Problems:   HTN (hypertension)   Disposition: Remain inpatient  DVT Prophylaxis: Prophylactic Lovenox or Heparin or SCD's  Code Status: Full code  Family Communication None  Procedures:  None  CONSULTS:  None  Time spent 40 minutes-which includes 50% of the time with face-to-face with patient/ family and coordinating care related to the above assessment and plan.    MEDICATIONS: Scheduled Meds: . diphenhydrAMINE  25 mg Intravenous Q6H  . famotidine  20 mg Oral BID  . hydrochlorothiazide  12.5 mg Oral Daily  . methylPREDNISolone (SOLU-MEDROL) injection  80 mg Intravenous Q12H   Continuous Infusions:  PRN Meds:.ALPRAZolam, HYDROcodone-acetaminophen, HYDROmorphone (DILAUDID) injection, labetalol, morphine injection  Antibiotics: Anti-infectives   None       PHYSICAL EXAM: Vital signs in last 24 hours: Filed Vitals:   04/07/14 2207 04/08/14 0032 04/08/14 0137 04/08/14 0433  BP: 174/99 168/104 153/91 154/91  Pulse: 81 65 66 69  Temp: 97.6 F (36.4 C)   98.2 F (36.8 C)  TempSrc:    Oral  Resp: 18   18  Height: '5\' 5"'  (1.651 m)     Weight: 112.03 kg (246 lb 15.7 oz)     SpO2:  100%   99%    Weight change:  Filed Weights   04/07/14 2207  Weight: 112.03 kg (246 lb 15.7 oz)   Body mass index is 41.1 kg/(m^2).   Gen Exam: Awake and alert with clear speech.   HEENT:  Lips still with significant swelling, periorbital puffiness.  No evident airway compromise. Neck: Supple, No JVD.   Chest: B/L Clear.   CVS: S1 S2 Regular, no murmurs.  Abdomen: soft, BS +, non tender, non distended.  Extremities: no edema, lower extremities warm to touch. Neurologic: Non Focal.         LAB RESULTS: Am labs pending for 7/25  RADIOLOGY STUDIES/RESULTS: Dg Chest 2 View  03/30/2014   CLINICAL DATA:  Preoperative films.  EXAM: CHEST  2 VIEW  COMPARISON:  PA and lateral chest 03/17/2012 and 01/11/2011.  FINDINGS: Heart size and mediastinal contours are within normal limits. Both lungs are clear. Visualized skeletal structures are unremarkable.  IMPRESSION: Negative exam.   Electronically Signed   By: Inge Rise M.D.   On: 03/30/2014 15:14   Mm Breast Surgical Specimen  04/07/2014   CLINICAL DATA:  Needle localization performed for excisional biopsy of a fibroadenoma of the right breast.  EXAM: SPECIMEN RADIOGRAPH OF THE RIGHT BREAST  COMPARISON:  Previous exam(s)  FINDINGS: Status post excision of the right breast. The wire tip and biopsy marker clip and a small nodular density are present and are marked for pathology.  IMPRESSION: Specimen radiograph of the right breast.   Electronically Signed  By: Curlene Dolphin M.D.   On: 04/07/2014 13:44   Mm Rt Plc Breast Loc Dev   1st Lesion  Inc Mammo Guide  04/07/2014   CLINICAL DATA:  Needle localization of the right breast for surgical excision of a biopsy-proven fibroadenoma in the right breast. Patient requested excision of the mass.  EXAM: NEEDLE LOCALIZATION OF THE RIGHT BREAST WITH MAMMO GUIDANCE  COMPARISON:  Previous exams.  FINDINGS: Patient presents for needle localization prior to excisional biopsy. I met with the patient and  we discussed the procedure of needle localization including benefits and alternatives. We discussed the high likelihood of a successful procedure. We discussed the risks of the procedure, including infection, bleeding, tissue injury, and further surgery. Informed, written consent was given. The usual time-out protocol was performed immediately prior to the procedure.  Using mammographic guidance, sterile technique, 2% lidocaine and a 7 cm modified Kopans needle, a coil shaped biopsy clip in the retroareolar right breast was localized using lateral to medial approach. The films were marked for Dr. Zella Richer.  IMPRESSION: Needle localization right breast. No apparent complications.   Electronically Signed   By: Curlene Dolphin M.D.   On: 04/07/2014 09:14    Cecille Aver, PA-C [facility] Pager:[phone]  If 7PM-7AM, please contact night-coverage [email or web address] Password [email or web address] 04/08/2014, 2:52 PM   LOS: 1 day   **Disclaimer: This note may have been dictated with voice recognition software. Similar sounding words can inadvertently be transcribed and this note may contain transcription errors which may not have been corrected upon publication of note.**  Pt seen and examined. Agree with assessment and plan as outlined above. Pt presents with angioedema, likely secondary to ACEI. ACEI now stopped and pt cont on benadryl and steroids. Stable thus far. Marylu Lund, MD P [phone]

## 2014-04-09 DIAGNOSIS — I10 Essential (primary) hypertension: Secondary | ICD-10-CM

## 2014-04-09 DIAGNOSIS — D249 Benign neoplasm of unspecified breast: Secondary | ICD-10-CM

## 2014-04-09 LAB — COMPREHENSIVE METABOLIC PANEL
ALBUMIN: 3.6 g/dL (ref 3.5–5.2)
ALT: 13 U/L (ref 0–35)
ANION GAP: 13 (ref 5–15)
AST: 10 U/L (ref 0–37)
Alkaline Phosphatase: 79 U/L (ref 39–117)
BUN: 14 mg/dL (ref 6–23)
CHLORIDE: 101 meq/L (ref 96–112)
CO2: 25 mEq/L (ref 19–32)
CREATININE: 0.76 mg/dL (ref 0.50–1.10)
Calcium: 8.8 mg/dL (ref 8.4–10.5)
GFR calc Af Amer: >90 mL/min (ref >90)
GFR calc non Af Amer: >90 mL/min (ref >90)
Glucose, Bld: 160 mg/dL — ABNORMAL HIGH (ref 70–99)
Potassium: 4.5 mEq/L (ref 3.7–5.3)
Sodium: 139 mEq/L (ref 137–147)
TOTAL PROTEIN: 7.1 g/dL (ref 6.0–8.3)
Total Bilirubin: <0.2 mg/dL — ABNORMAL LOW (ref 0.3–1.2)

## 2014-04-09 LAB — CBC
HEMATOCRIT: 40.6 % (ref 36.0–46.0)
HEMOGLOBIN: 12.8 g/dL (ref 12.0–15.0)
MCH: 28.4 pg (ref 26.0–34.0)
MCHC: 31.5 g/dL (ref 30.0–36.0)
MCV: 90 fL (ref 78.0–100.0)
Platelets: 309 10*3/uL (ref 150–400)
RBC: 4.51 MIL/uL (ref 3.87–5.11)
RDW: 14.7 % (ref 11.5–15.5)
WBC: 15.6 10*3/uL — AB (ref 4.0–10.5)

## 2014-04-09 MED ORDER — DIPHENHYDRAMINE HCL 25 MG PO CAPS
25.0000 mg | ORAL_CAPSULE | Freq: Four times a day (QID) | ORAL | Status: DC
  Administered 2014-04-09: 25 mg via ORAL
  Filled 2014-04-09: qty 1

## 2014-04-09 MED ORDER — PREDNISONE 5 MG PO TABS: 5.0000 mg | ORAL_TABLET | Freq: Every day | ORAL | Status: DC

## 2014-04-09 MED ORDER — FAMOTIDINE 20 MG PO TABS: 20.0000 mg | ORAL_TABLET | Freq: Two times a day (BID) | ORAL | Status: DC

## 2014-04-09 MED ORDER — AMLODIPINE BESYLATE 10 MG PO TABS: 10.0000 mg | ORAL_TABLET | Freq: Every day | ORAL | Status: DC

## 2014-04-09 NOTE — Discharge Instructions (Signed)
Angioedema °Angioedema is a sudden swelling of tissues, often of the skin. It can occur on the face or genitals or in the abdomen or other body parts. The swelling usually develops over a short period and gets better in 24 to 48 hours. It often begins during the night and is found when the person wakes up. The person may also get red, itchy patches of skin (hives). Angioedema can be dangerous if it involves swelling of the air passages.  °Depending on the cause, episodes of angioedema may only happen once, come back in unpredictable patterns, or repeat for several years and then gradually fade away.  °CAUSES  °Angioedema can be caused by an allergic reaction to various triggers. It can also result from nonallergic causes, including reactions to drugs, immune system disorders, viral infections, or an abnormal gene that is passed to you from your parents (hereditary). For some people with angioedema, the cause is unknown.  °Some things that can trigger angioedema include:  °· Foods.   °· Medicines, such as ACE inhibitors, ARBs, nonsteroidal anti-inflammatory agents, or estrogen.   °· Latex.   °· Animal saliva.   °· Insect stings.   °· Dyes used in X-rays.   °· Mild injury.   °· Dental work. °· Surgery. °· Stress.   °· Sudden changes in temperature.   °· Exercise. °SIGNS AND SYMPTOMS  °· Swelling of the skin. °· Hives. If these are present, there is also intense itching. °· Redness in the affected area.   °· Pain in the affected area. °· Swollen lips or tongue. °· Breathing problems. This may happen if the air passages swell. °· Wheezing. °If internal organs are involved, there may be:  °· Nausea.   °· Abdominal pain.   °· Vomiting.   °· Difficulty swallowing.   °· Difficulty passing urine. °DIAGNOSIS  °· Your health care provider will examine the affected area and take a medical and family history. °· Various tests may be done to help determine the cause. Tests may include: °¨ Allergy skin tests to see if the problem  is an allergic reaction.   °¨ Blood tests to check for hereditary angioedema.   °¨ Tests to check for underlying diseases that could cause the condition.   °· A review of your medicines, including over-the-counter medicines, may be done. °TREATMENT  °Treatment will depend on the cause of the angioedema. Possible treatments include:  °· Removal of anything that triggered the condition (such as stopping certain medicines).   °· Medicines to treat symptoms or prevent attacks. Medicines given may include:   °¨ Antihistamines.   °¨ Epinephrine injection.   °¨ Steroids.   °· Hospitalization may be required for severe attacks. If the air passages are affected, it can be an emergency. Tubes may need to be placed to keep the airway open. °HOME CARE INSTRUCTIONS  °· Take all medicines as directed by your health care provider. °· If you were given medicines for emergency allergy treatment, always carry them with you. °· Wear a medical bracelet as directed by your health care provider.   °· Avoid known triggers. °SEEK MEDICAL CARE IF:  °· You have repeat attacks of angioedema.   °· Your attacks are more frequent or more severe despite preventive measures.   °· You have hereditary angioedema and are considering having children. It is important to discuss with your health care provider the risks of passing the condition on to your children. °SEEK IMMEDIATE MEDICAL CARE IF:  °· You have severe swelling of the mouth, tongue, or lips. °· You have difficulty breathing.   °· You have difficulty swallowing.   °· You faint. °MAKE   SURE YOU: °· Understand these instructions. °· Will watch your condition. °· Will get help right away if you are not doing well or get worse. °Document Released: 11/11/2001 Document Revised: 01/17/2014 Document Reviewed: 04/26/2013 °[employer]® Patient Information ©2015 [employer]. This information is not intended to replace advice given to you by your health care provider. Make sure you discuss any questions  you have with your health care provider. ° °

## 2014-04-09 NOTE — Discharge Summary (Addendum)
Physician Discharge Summary  Dana [name] RFX:[id number] DOB: 21-Nov-1965 DOA: 04/07/2014  PCP: [name], MD  Admit date: 04/07/2014 Discharge date: 04/09/2014  Time spent: 35 minutes  Recommendations for Outpatient Follow-up:  1. Follow up with PCP in 1-2 weeks 2. ACEI now listed as an allergy causing angioedema 3. Would recommend referral to allergist as an outpatient  Discharge Diagnoses:  Principal Problem:   Angioedema of lips Active Problems:   HTN (hypertension)   Discharge Condition: improved  Diet recommendation: regular  Filed Weights   04/07/14 2207  Weight: 112.03 kg (246 lb 15.7 oz)    History of present illness:  Please see admit h and p from 7/23 for details. Briefly, pt presents with angioedema thought related to home meds.  Hospital Course:  Angioedema:  - significant swelling of face and lips s/p lumpectomy on 7/23  - believe to be secondary to lisinopril; lisinopril was discontinued  - Patient able to swallow without difficulty. No complaints of difficulty breathing.  - angioedema improved with Solu-Med, benadryl, and pepcid  - pt will complete a course of prednisone taper on discharge and follow up closely with PCP Hypertension:  - started on labetalol and HCTZ for HTN in hospital after d/c of lisinopril  - BP became elevated - Amlodipine prescribed to replace lisinopril per PCP  Consultations:  none  Discharge Exam: Filed Vitals:   04/08/14 0433 04/08/14 1459 04/08/14 2100 04/09/14 0527  BP: 154/91 135/78 160/82 185/97  Pulse: 69 85 77 66  Temp: 98.2 F (36.8 C) 98.2 F (36.8 C) 98.3 F (36.8 C) 98.7 F (37.1 C)  TempSrc: Oral Oral Oral Oral  Resp: _0 Height:      Weight:      SpO2: 99% 99% 96% 95%    General: awake, in nad Cardiovascular: regular, s1, s2 Respiratory: normal resp effort, no wheezing  Discharge Instructions    Medication List         ALPRAZolam 0.5 MG tablet  Commonly known as:  XANAX   Take 0.25-0.5 mg by mouth at bedtime as needed for anxiety.     amLODipine 5 MG tablet  Commonly known as:  NORVASC  Take 1 tablet (5 mg total) by mouth daily.     famotidine 20 MG tablet  Commonly known as:  PEPCID  Take 1 tablet (20 mg total) by mouth 2 (two) times daily.     hydrochlorothiazide 12.5 MG capsule  Commonly known as:  MICROZIDE  Take 12.5 mg by mouth daily.     HYDROcodone-acetaminophen 5-325 MG per tablet  Commonly known as:  NORCO/VICODIN  Take 1-2 tablets by mouth every 4 (four) hours as needed for moderate pain or severe pain.     predniSONE 5 MG tablet  Commonly known as:  DELTASONE  Take 1 tablet (5 mg total) by mouth daily with breakfast.       Allergies  Allergen Reactions  . Lisinopril   . Penicillins Itching and Swelling   Follow-up Information   Follow up with FRY,STEPHEN A, MD. Schedule an appointment as soon as possible for a visit in 1 week.   Specialty:  Family Medicine   Contact information:   [place] [address] [phone]        The results of significant diagnostics from this hospitalization (including imaging, microbiology, ancillary and laboratory) are listed below for reference.    Significant Diagnostic Studies: Dg Chest 2 View  03/30/2014   CLINICAL DATA:  Preoperative films.  EXAM: CHEST  2 VIEW  COMPARISON:  PA and lateral chest 03/17/2012 and 01/11/2011.  FINDINGS: Heart size and mediastinal contours are within normal limits. Both lungs are clear. Visualized skeletal structures are unremarkable.  IMPRESSION: Negative exam.   Electronically Signed   By: Inge Rise M.D.   On: 03/30/2014 15:14   Mm Breast Surgical Specimen  04/07/2014   CLINICAL DATA:  Needle localization performed for excisional biopsy of a fibroadenoma of the right breast.  EXAM: SPECIMEN RADIOGRAPH OF THE RIGHT BREAST  COMPARISON:  Previous exam(s)  FINDINGS: Status post excision of the right breast. The wire tip and biopsy  marker clip and a small nodular density are present and are marked for pathology.  IMPRESSION: Specimen radiograph of the right breast.   Electronically Signed   By: Curlene Dolphin M.D.   On: 04/07/2014 13:44   Mm Rt Plc Breast Loc Dev   1st Lesion  Inc Mammo Guide  04/07/2014   CLINICAL DATA:  Needle localization of the right breast for surgical excision of a biopsy-proven fibroadenoma in the right breast. Patient requested excision of the mass.  EXAM: NEEDLE LOCALIZATION OF THE RIGHT BREAST WITH MAMMO GUIDANCE  COMPARISON:  Previous exams.  FINDINGS: Patient presents for needle localization prior to excisional biopsy. I met with the patient and we discussed the procedure of needle localization including benefits and alternatives. We discussed the high likelihood of a successful procedure. We discussed the risks of the procedure, including infection, bleeding, tissue injury, and further surgery. Informed, written consent was given. The usual time-out protocol was performed immediately prior to the procedure.  Using mammographic guidance, sterile technique, 2% lidocaine and a 7 cm modified Kopans needle, a coil shaped biopsy clip in the retroareolar right breast was localized using lateral to medial approach. The films were marked for Dr. Zella Richer.  IMPRESSION: Needle localization right breast. No apparent complications.   Electronically Signed   By: Curlene Dolphin M.D.   On: 04/07/2014 09:14    Microbiology: No results found for this or any previous visit (from the past 240 hour(s)).   Labs: Basic Metabolic Panel:  Recent Labs Lab 04/09/14 0433  NA 139  K 4.5  CL 101  CO2 25  GLUCOSE 160*  BUN 14  CREATININE 0.76  CALCIUM 8.8   Liver Function Tests:  Recent Labs Lab 04/09/14 0433  AST 10  ALT 13  ALKPHOS 79  BILITOT <0.2*  PROT 7.1  ALBUMIN 3.6   No results found for this basename: LIPASE, AMYLASE,  in the last 168 hours No results found for this basename: AMMONIA,  in the last  168 hours CBC:  Recent Labs Lab 04/09/14 0433  WBC 15.6*  HGB 12.8  HCT 40.6  MCV 90.0  PLT 309   Cardiac Enzymes: No results found for this basename: CKTOTAL, CKMB, CKMBINDEX, TROPONINI,  in the last 168 hours BNP: BNP (last 3 results) No results found for this basename: PROBNP,  in the last 8760 hours CBG: No results found for this basename: GLUCAP,  in the last 168 hours   Signed:  CHIU, STEPHEN K  [facility] 04/09/2014, 10:56 AM

## 2014-04-09 NOTE — Progress Notes (Signed)
Nsg Discharge Note  Admit Date:  04/07/2014 Discharge date: 04/09/2014   Dana [name] to be D/C'd Home per MD order.  AVS completed.  Copy for chart, and copy for patient signed, and dated. Patient/caregiver able to verbalize understanding.  Discharge Medication:   Medication List         ALPRAZolam 0.5 MG tablet  Commonly known as:  XANAX  Take 0.25-0.5 mg by mouth at bedtime as needed for anxiety.     amLODipine 5 MG tablet  Commonly known as:  NORVASC  Take 1 tablet (5 mg total) by mouth daily.     famotidine 20 MG tablet  Commonly known as:  PEPCID  Take 1 tablet (20 mg total) by mouth 2 (two) times daily.     hydrochlorothiazide 12.5 MG capsule  Commonly known as:  MICROZIDE  Take 12.5 mg by mouth daily.     HYDROcodone-acetaminophen 5-325 MG per tablet  Commonly known as:  NORCO/VICODIN  Take 1-2 tablets by mouth every 4 (four) hours as needed for moderate pain or severe pain.     predniSONE 5 MG tablet  Commonly known as:  DELTASONE  Take 1 tablet (5 mg total) by mouth daily with breakfast.        Discharge Assessment: Filed Vitals:   04/09/14 0527  BP: 185/97  Pulse: 66  Temp: 98.7 F (37.1 C)  Resp: 16   Skin clean, dry and intact without evidence of skin break down, no evidence of skin tears noted. Steri strips to Right breast intact, patient has been educated about the care of them. Also, pt. Educated about angioedema in lips. IV catheter discontinued intact. Site without signs and symptoms of complications - no redness or edema noted at insertion site, patient denies c/o pain - only slight tenderness at site.  Dressing with slight pressure applied.  D/c Instructions-Education: Discharge instructions given to patient/family with verbalized understanding. D/c education completed with patient/family including follow up instructions, medication list, d/c activities limitations if indicated, with other d/c instructions as indicated by MD - patient able to  verbalize understanding, all questions fully answered. Patient instructed to return to ED, call 911, or call MD for any changes in condition.  Patient escorted via [place], and D/C home via private auto.  Dayle Points, RN 04/09/2014 11:30 AM

## 2014-04-11 ENCOUNTER — Telehealth (INDEPENDENT_AMBULATORY_CARE_PROVIDER_SITE_OTHER): Payer: Self-pay | Admitting: *Deleted

## 2014-04-11 NOTE — Telephone Encounter (Signed)
Pt called in wanting to know her restrictions regarding her working.  I advised the pt, that normally when a pt has a lumpectomy, we normally take them out of work for 2 weeks to give them time to heal up.  I advised her that she is scheduled to f/u with Dr. Zella Richer 04-18-14, for her to wait until she sees him at that time and he will elaborate further what his decision is regarding when she is to return to work and her restrictions.  Pt verbalized understanding.  Anderson Malta

## 2014-04-15 ENCOUNTER — Telehealth (INDEPENDENT_AMBULATORY_CARE_PROVIDER_SITE_OTHER): Payer: Self-pay | Admitting: General Surgery

## 2014-04-15 DIAGNOSIS — G8918 Other acute postprocedural pain: Secondary | ICD-10-CM

## 2014-04-15 MED ORDER — HYDROCODONE-ACETAMINOPHEN 5-325 MG PO TABS: 1.0000 | ORAL_TABLET | ORAL | Status: DC | PRN

## 2014-04-15 NOTE — Addendum Note (Signed)
Addended by: Ivor Costa on: 04/15/2014 05:14 PM   Modules accepted: Orders

## 2014-04-15 NOTE — Telephone Encounter (Signed)
Patient calling in s/p breast lumpectomy w/ Dr. Zella Richer on 7/23.  Pain constant and level 8.5 on 0-10 scale.  Pain located at incision line. Denies fever, chills, N/V.  continuously wearing sports bra to have compression on breast.  Alternating between ice packs and heating pads and using ibuprofen. Would like a refill on [place]. Last filled at d/c from surgery. Informed her we would have our urgent office physician look at refill request since Dr. Zella Richer is out of the office.  Let her know we would give her a call back once we get an answer on the request.

## 2014-04-15 NOTE — Telephone Encounter (Signed)
Called and spoke to patient to make aware Norco refill has been approved and at the front desk for pick up.  Patient to send spouse to pick up.

## 2014-04-18 ENCOUNTER — Ambulatory Visit (INDEPENDENT_AMBULATORY_CARE_PROVIDER_SITE_OTHER): Payer: BC Managed Care – PPO | Admitting: General Surgery

## 2014-04-18 VITALS — BP 160/110 | HR 84 | Temp 97.9°F | Resp 24

## 2014-04-18 DIAGNOSIS — Z4889 Encounter for other specified surgical aftercare: Secondary | ICD-10-CM

## 2014-04-18 NOTE — Patient Instructions (Signed)
Activities as tolerated.  Light duty work for one week.  Wear a good supportive bra.

## 2014-04-18 NOTE — Progress Notes (Signed)
Procedure:  Right breast lumpectomy after wire localization  Date:  04/07/2014  Pathology:  Benign fibroadenoma and fibrocystic changes. No evidence of malignancy.  History:  She is here for her first postoperative visit. She had some postoperative angioedema of affecting her upper lip and was hospitalized briefly for this. The swelling has gone down. She is still sore at the lumpectomy site. She is back at work doing light duty.  Exam: General- Is in NAD. HEENT-no lip swelling.  Right breast-incision is clean and intact with no significant swelling, ecchymosis, or erythema.  Assessment:  Pathology is benign and right breast wound is healing well. Angioedema is resolved. I told her that if she had to have anesthesia for anything else, she should mention this to the anesthesiologist.  Plan:  Where a supportive bra. Light-duty work for one week then resume full duty. Return visit as needed.

## 2014-05-17 ENCOUNTER — Telehealth: Payer: Self-pay | Admitting: Family Medicine

## 2014-05-17 NOTE — Telephone Encounter (Signed)
Pt request refill of the following:  hydrochlorothiazide (HYDRODIURIL) 25 MG tablet    Phamacy:   Pick up

## 2014-05-18 NOTE — Telephone Encounter (Signed)
Pt following on rx request. Pt states she is out of meds [facility] [address]

## 2014-05-19 ENCOUNTER — Telehealth: Payer: Self-pay | Admitting: Family Medicine

## 2014-05-19 DIAGNOSIS — G8918 Other acute postprocedural pain: Secondary | ICD-10-CM

## 2014-05-19 MED ORDER — HYDROCHLOROTHIAZIDE 12.5 MG PO CAPS: 12.5000 mg | ORAL_CAPSULE | Freq: Every day | ORAL | Status: DC

## 2014-05-19 NOTE — Telephone Encounter (Signed)
I sent script e-scribe, tried to reach pt and no answer.

## 2014-05-19 NOTE — Telephone Encounter (Signed)
Pt needs new rx hydrocodone. Pt is aware md out of office today

## 2014-05-20 MED ORDER — HYDROCODONE-ACETAMINOPHEN 5-325 MG PO TABS: 1.0000 | ORAL_TABLET | Freq: Four times a day (QID) | ORAL | Status: DC | PRN

## 2014-05-20 NOTE — Telephone Encounter (Signed)
Script is ready for pick up and I spoke with pt.  

## 2014-05-20 NOTE — Telephone Encounter (Signed)
done

## 2014-06-09 ENCOUNTER — Telehealth: Payer: Self-pay | Admitting: Family Medicine

## 2014-06-09 NOTE — Telephone Encounter (Signed)
Pt states she has sore throat,stopped up head, low grad fever and cough. Would like abx called in to  [facility] [address]

## 2014-06-10 MED ORDER — AZITHROMYCIN 250 MG PO TABS: ORAL_TABLET | ORAL | Status: DC

## 2014-06-10 NOTE — Telephone Encounter (Signed)
Rx sent to pharmacy.  Patient is aware.

## 2014-06-10 NOTE — Telephone Encounter (Signed)
Call in a Zpack  ?

## 2014-07-01 ENCOUNTER — Encounter: Payer: 59 | Admitting: Gynecology

## 2014-07-05 ENCOUNTER — Ambulatory Visit (INDEPENDENT_AMBULATORY_CARE_PROVIDER_SITE_OTHER): Payer: BC Managed Care – PPO | Admitting: Family Medicine

## 2014-07-05 ENCOUNTER — Encounter: Payer: Self-pay | Admitting: Family Medicine

## 2014-07-05 VITALS — BP 163/108 | HR 101 | Temp 99.1°F | Ht 65.0 in | Wt 248.0 lb

## 2014-07-05 DIAGNOSIS — G8918 Other acute postprocedural pain: Secondary | ICD-10-CM

## 2014-07-05 DIAGNOSIS — J209 Acute bronchitis, unspecified: Secondary | ICD-10-CM

## 2014-07-05 MED ORDER — HYDROCODONE-HOMATROPINE 5-1.5 MG/5ML PO SYRP: 5.0000 mL | ORAL_SOLUTION | ORAL | Status: DC | PRN

## 2014-07-05 MED ORDER — HYDROCODONE-ACETAMINOPHEN 5-325 MG PO TABS: 1.0000 | ORAL_TABLET | Freq: Four times a day (QID) | ORAL | Status: DC | PRN

## 2014-07-05 MED ORDER — LEVOFLOXACIN 500 MG PO TABS: 500.0000 mg | ORAL_TABLET | Freq: Every day | ORAL | Status: AC

## 2014-07-05 NOTE — Progress Notes (Signed)
Pre visit review using our clinic review tool, if applicable. No additional management support is needed unless otherwise documented below in the visit note. 

## 2014-07-05 NOTE — Progress Notes (Signed)
   Subjective:    Patient ID: Dana [name], female    DOB: 09-04-1966, 48 y.o.   MRN: [id number]  HPI Here for 3 days of fevers, ST, chest tightness and coughing up green sputum.    Review of Systems  Constitutional: Positive for fever.  HENT: Positive for congestion and postnasal drip. Negative for sinus pressure.   Eyes: Negative.   Respiratory: Positive for cough and chest tightness.        Objective:   Physical Exam  Constitutional: She appears well-developed and well-nourished.  HENT:  Right Ear: External ear normal.  Left Ear: External ear normal.  Nose: Nose normal.  Mouth/Throat: Oropharynx is clear and moist.  Eyes: Conjunctivae are normal.  Pulmonary/Chest: Effort normal. No respiratory distress. She has no rales.  Scattered wheezes and rhonchi   Lymphadenopathy:    She has no cervical adenopathy.          Assessment & Plan:  She can take a total of 10 mg of prednisone a day for 3-4 days. Drink fluids and take Mucinex. Written out of work today

## 2014-07-06 ENCOUNTER — Telehealth: Payer: Self-pay | Admitting: Family Medicine

## 2014-07-06 NOTE — Telephone Encounter (Signed)
Note is ready and pt is aware.

## 2014-07-06 NOTE — Telephone Encounter (Signed)
Pt seen tues 10/20 and states she does not feel like going to work again today and needs a note for work to stay home. pls call [phone] when ready and she will have someone PU. Pt had thought she might feel like working, but does not.

## 2014-07-06 NOTE — Telephone Encounter (Signed)
Per Dr. Sarajane Jews, okay to give another note.

## 2014-07-06 NOTE — Telephone Encounter (Signed)
Pt has new phone # [phone]

## 2014-07-18 ENCOUNTER — Encounter: Payer: Self-pay | Admitting: Family Medicine

## 2014-07-25 ENCOUNTER — Other Ambulatory Visit: Payer: Self-pay | Admitting: Family Medicine

## 2014-09-08 ENCOUNTER — Ambulatory Visit: Payer: BC Managed Care – PPO | Admitting: Family Medicine

## 2014-09-12 ENCOUNTER — Telehealth: Payer: Self-pay | Admitting: Family Medicine

## 2014-09-12 DIAGNOSIS — G8918 Other acute postprocedural pain: Secondary | ICD-10-CM

## 2014-09-12 NOTE — Telephone Encounter (Signed)
Patient is requesting a re-fill on HYDROcodone-acetaminophen (NORCO/VICODIN) 5-325 MG per tablet. She is aware Dr. Sarajane Jews is out of the office until 09/13/14.

## 2014-09-13 MED ORDER — HYDROCODONE-ACETAMINOPHEN 5-325 MG PO TABS: 1.0000 | ORAL_TABLET | Freq: Four times a day (QID) | ORAL | Status: DC | PRN

## 2014-09-13 NOTE — Telephone Encounter (Signed)
done

## 2014-09-13 NOTE — Telephone Encounter (Signed)
Script is ready for pick up and I spoke with pt.  

## 2014-11-11 ENCOUNTER — Other Ambulatory Visit: Payer: Self-pay | Admitting: Family Medicine

## 2014-11-11 DIAGNOSIS — G8918 Other acute postprocedural pain: Secondary | ICD-10-CM

## 2014-11-11 NOTE — Telephone Encounter (Signed)
Pt needs new rx hydrocodone °

## 2014-11-14 MED ORDER — HYDROCODONE-ACETAMINOPHEN 5-325 MG PO TABS: 1.0000 | ORAL_TABLET | Freq: Four times a day (QID) | ORAL | Status: DC | PRN

## 2014-11-14 NOTE — Telephone Encounter (Signed)
Left message to advice pt Rx ready for pick up

## 2014-11-14 NOTE — Telephone Encounter (Signed)
done

## 2014-11-28 ENCOUNTER — Telehealth: Payer: Self-pay | Admitting: Internal Medicine

## 2014-11-28 NOTE — Telephone Encounter (Signed)
Left message for patient to call back  

## 2014-11-29 NOTE — Telephone Encounter (Signed)
Patient c/o 2 weeks of worsening abdominal pain and now has vomiting occasionally. She will come in and see Arta Bruce, PA on 12/02/14

## 2014-11-29 NOTE — Telephone Encounter (Signed)
Left message for patient to call back  

## 2014-11-30 ENCOUNTER — Ambulatory Visit (INDEPENDENT_AMBULATORY_CARE_PROVIDER_SITE_OTHER): Payer: BLUE CROSS/BLUE SHIELD | Admitting: Gynecology

## 2014-11-30 ENCOUNTER — Encounter: Payer: Self-pay | Admitting: Gynecology

## 2014-11-30 ENCOUNTER — Telehealth: Payer: Self-pay | Admitting: *Deleted

## 2014-11-30 VITALS — BP 170/80 | Ht 65.0 in | Wt 248.0 lb

## 2014-11-30 DIAGNOSIS — Z9889 Other specified postprocedural states: Secondary | ICD-10-CM

## 2014-11-30 DIAGNOSIS — I10 Essential (primary) hypertension: Secondary | ICD-10-CM | POA: Diagnosis not present

## 2014-11-30 DIAGNOSIS — Z86018 Personal history of other benign neoplasm: Secondary | ICD-10-CM | POA: Insufficient documentation

## 2014-11-30 DIAGNOSIS — N644 Mastodynia: Secondary | ICD-10-CM | POA: Diagnosis not present

## 2014-11-30 MED ORDER — IBUPROFEN 800 MG PO TABS: 800.0000 mg | ORAL_TABLET | Freq: Three times a day (TID) | ORAL | Status: DC | PRN

## 2014-11-30 MED ORDER — DANAZOL 100 MG PO CAPS: 100.0000 mg | ORAL_CAPSULE | Freq: Two times a day (BID) | ORAL | Status: DC

## 2014-11-30 NOTE — Telephone Encounter (Signed)
-----   Message from [name], MD sent at 11/30/2014 12:44 PM EDT -----  Please schedule diagnostic mammogram of left breast for this patient with severe mastodynia retroareolar region. No palpable masses on exam. No supraclavicular axillary lymphadenopathy. Patient last year with history of right lumpectomy for fibroadenoma.

## 2014-11-30 NOTE — Patient Instructions (Addendum)
Breast Tenderness Breast tenderness is a common problem for women of all ages. Breast tenderness may cause mild discomfort to severe pain. It has a variety of causes. Your health care provider will find out the likely cause of your breast tenderness by examining your breasts, asking you about symptoms, and ordering some tests. Breast tenderness usually does not mean you have breast cancer. HOME CARE INSTRUCTIONS  Breast tenderness often can be handled at home. You can try:  Getting fitted for a new bra that provides more support, especially during exercise.  Wearing a more supportive bra or sports bra while sleeping when your breasts are very tender.  If you have a breast injury, apply ice to the area:  Put ice in a plastic bag.  Place a towel between your skin and the bag.  Leave the ice on for 20 minutes, 2-3 times a day.  If your breasts are too full of milk as a result of breastfeeding, try:  Expressing milk either by hand or with a breast pump.  Applying a warm compress to the breasts for relief.  Taking over-the-counter pain relievers, if approved by your health care provider.  Taking other medicines that your health care provider prescribes. These may include antibiotic medicines or birth control pills. Over the long term, your breast tenderness might be eased if you:  Cut down on caffeine.  Reduce the amount of fat in your diet. Keep a log of the days and times when your breasts are most tender. This will help you and your health care provider find the cause of the tenderness and how to relieve it. Also, learn how to do breast exams at home. This will help you notice if you have an unusual growth or lump that could cause tenderness. SEEK MEDICAL CARE IF:   Any part of your breast is hard, red, and hot to the touch. This could be a sign of infection.  Fluid is coming out of your nipples (and you are not breastfeeding). Especially watch for blood or pus.  You have a fever  as well as breast tenderness.  You have a new or painful lump in your breast that remains after your menstrual period ends.  You have tried to take care of the pain at home, but it has not gone away.  Your breast pain is getting worse, or the pain is making it hard to do the things you usually do during your day. Document Released: 08/15/2008 Document Revised: 05/05/2013 Document Reviewed: 04/01/2013 [facility] Patient Information 2015 [place], [place]. This information is not intended to replace advice given to you by your health care provider. Make sure you discuss any questions you have with your health care provider. Danazol capsules What is this medicine? DANAZOL (DA na zole) is used in women to treat endometriosis and the symptoms of fibrocystic breast disease. This medicine may also be used in men and women to prevent serious allergic reactions known as angioedema. This medicine may be used for other purposes; ask your health care provider or pharmacist if you have questions. COMMON BRAND NAME(S): Danocrine What should I tell my health care provider before I take this medicine? They need to know if you have any of these conditions: -breast cancer -heart disease -kidney disease -liver disease -porphyria -unusual vaginal bleeding -an unusual or allergic reaction to danazol, other medicines, foods, dyes, or preservatives -pregnant or trying to get pregnant -breast-feeding How should I use this medicine? Take this medicine by mouth with a glass of  water. Follow the directions on the prescription label. Take this medicine with food to decrease stomach upset. Take your doses at regular intervals. Do not take your medicine more often than directed. Talk to your pediatrician regarding the use of this medicine in children. Special care may be needed. Overdosage: If you think you have taken too much of this medicine contact a poison control center or emergency room at once. NOTE: This medicine  is only for you. Do not share this medicine with others. What if I miss a dose? If you miss a dose, take it as soon as you can. If it is almost time for your next dose, take only that dose. Do not take double or extra doses. What may interact with this medicine? Do not take this medicine with any of the following medications: -cisapride -pimozide -ranolazine This medicine may also interact with the following medications: -carbamazepine -medicines that treat or prevent blood clots like warfarin This list may not describe all possible interactions. Give your health care provider a list of all the medicines, herbs, non-prescription drugs, or dietary supplements you use. Also tell them if you smoke, drink alcohol, or use illegal drugs. Some items may interact with your medicine. What should I watch for while using this medicine? Check with your doctor or health care professional if you are a female patient and notice any changes in your voice, decrease in breast size, or if hair starts growing on your face. This medicine should not be used in pregnancy. You should use a non-hormonal form of birth control while on this medicine. If you become pregnant or think you may be pregnant while you are taking this medicine, you should stop taking this medicine and contact your doctor or health care professional. This medicine may cause risk to a female fetus. This medicine can affect your menstrual cycle and you may stop having menstrual periods. These will return to normal within 2 to 3 months after you stop taking this medicine. What side effects may I notice from receiving this medicine? Side effects that you should report to your doctor or health care professional as soon as possible: -allergic reactions like skin rash, itching or hives, swelling of the face, lips, or tongue -changes in vision -dark urine -decrease in breast size -hair loss or unusual hair growth -headache -irregular vaginal bleeding,  spotting -nausea, vomiting, stomach pain -redness, blistering, peeling or loosening of the skin, including inside the mouth -unusual bleeding or bruising -unusual swelling of feet or ankles -unusually weak or tired -voice changes -weight gain -yellowing of the skin or eyes Side effects that usually do not require medical attention (report to your doctor or health care professional if they continue or are bothersome): -acne, oily skin -hot flashes, sweating -mood changes -vaginal dryness or irritation This list may not describe all possible side effects. Call your doctor for medical advice about side effects. You may report side effects to FDA at 1-800-FDA-1088. Where should I keep my medicine? Keep out of the reach of children. Store at room temperature between 15 and 30 degrees C (59 and 86 degrees F). Throw away any unused medicine after the expiration date. NOTE: This sheet is a summary. It may not cover all possible information. If you have questions about this medicine, talk to your doctor, pharmacist, or health care provider.  2015, [employer]vier/[employer] Standard. (2007-12-24 16:38:33)

## 2014-11-30 NOTE — Telephone Encounter (Signed)
Order placed at breast center, they will contact pt to schedule.

## 2014-11-30 NOTE — Progress Notes (Signed)
   Patient's a 49 year old who presented to the office today stating that she will cup with severe pain in her left breast very sensitive and tender to touch. Patient denied any recent trauma or injury. She denied any nipple discharge. Review of her record indicated that back in May 2015 she had bilateral nipple discharge and was found to have a retro-areolar mass and subsequently underwent a lumpectomy and it was a fibroadenoma as the pathological description. Patient with past history of abdominal hysterectomy as a result of leiomyomatous uteri. She denies any increasing caffeine intake. She has one aunt with breast cancer. Patient stated at the time of discharge from the hospital when she returned back she had been diagnosed with angioedema as a result of the ACE inhibitor that she had been started on her hypertension. She is currently on Norvasc 5 mg daily. She is also taking Microzide 12.5 mg daily. She denies any shortness of breasts or any palpitations or any lightheadedness.  Exam: Blood pressure 170/80  Patient underwent a breast examination and both the supine and sitting position. Both breasts were pendulous and symmetrical in appearance. There was no skin discoloration or nipple inversion. The right breast had no palpable mass or tenderness no supra clavicular or axillary lymphadenopathy. Left breast exquisitely tender on touch in the areolar and retroareolar region but no discernible mass was noted. There was no supraclavicular axillary lymphadenopathy on that breast.  Assessment/plan: #1 patient with past history of right lumpectomy in 2015 fibroadenoma #2 exquisite left mastodynia left retroareolar region no discernible mass will be sent for diagnostic mammogram. She will be started on danazol 100 mg twice a day for the next 2-3 months. The risks benefits and pros and cons of medication were discussed include hirsutism. We discussed that the side effects to see more often at high doses and  with patient's had taken for long periods of time and she is on liquids to be placed on it for 3 months. Patient also was instructed to take vitamin E 600 units daily. We'll also instructed the patient discontinue all caffeine-containing products. She was also prescribed Motrin 800 mg to take 1 by mouth 3 times a day when necessary for discomfort and to apply warm moist compresses on that breast. #3 hypertension patient was reminded to take her medications. It appears it may be slightly elevated because she was very emotional when she came in because of her breast tenderness.

## 2014-12-02 ENCOUNTER — Ambulatory Visit: Payer: Self-pay | Admitting: Physician Assistant

## 2014-12-05 ENCOUNTER — Ambulatory Visit
Admission: RE | Admit: 2014-12-05 | Discharge: 2014-12-05 | Disposition: A | Discharge status: Home / Self Care | Payer: BLUE CROSS/BLUE SHIELD | Source: Ambulatory Visit | Attending: Gynecology | Admitting: Gynecology

## 2014-12-05 ENCOUNTER — Encounter (INDEPENDENT_AMBULATORY_CARE_PROVIDER_SITE_OTHER): Payer: Self-pay

## 2014-12-05 DIAGNOSIS — N644 Mastodynia: Secondary | ICD-10-CM

## 2014-12-05 NOTE — Telephone Encounter (Signed)
appointment 12/05/14 @ 2:00pm

## 2014-12-14 ENCOUNTER — Other Ambulatory Visit (INDEPENDENT_AMBULATORY_CARE_PROVIDER_SITE_OTHER): Payer: BLUE CROSS/BLUE SHIELD

## 2014-12-14 DIAGNOSIS — Z0000 Encounter for general adult medical examination without abnormal findings: Secondary | ICD-10-CM

## 2014-12-14 LAB — COMPREHENSIVE METABOLIC PANEL
ALK PHOS: 63 U/L (ref 39–117)
ALT: 16 U/L (ref 0–35)
AST: 16 U/L (ref 0–37)
Albumin: 3.9 g/dL (ref 3.5–5.2)
BUN: 14 mg/dL (ref 6–23)
CO2: 29 mEq/L (ref 19–32)
Calcium: 9.1 mg/dL (ref 8.4–10.5)
Chloride: 104 mEq/L (ref 96–112)
Creatinine, Ser: 0.79 mg/dL (ref 0.40–1.20)
GFR: 99.37 mL/min (ref >60.00)
Glucose, Bld: 103 mg/dL — ABNORMAL HIGH (ref 70–99)
Potassium: 4.2 mEq/L (ref 3.5–5.1)
Sodium: 137 mEq/L (ref 135–145)
Total Bilirubin: 0.3 mg/dL (ref 0.2–1.2)
Total Protein: 6.8 g/dL (ref 6.0–8.3)

## 2014-12-14 LAB — CBC WITH DIFFERENTIAL/PLATELET
Basophils Absolute: 0 10*3/uL (ref 0.0–0.1)
Basophils Relative: 0.5 % (ref 0.0–3.0)
EOS ABS: 0.2 10*3/uL (ref 0.0–0.7)
EOS PCT: 2.2 % (ref 0.0–5.0)
HCT: 40.4 % (ref 36.0–46.0)
HEMOGLOBIN: 13.1 g/dL (ref 12.0–15.0)
Lymphocytes Relative: 25 % (ref 12.0–46.0)
Lymphs Abs: 1.9 10*3/uL (ref 0.7–4.0)
MCHC: 32.6 g/dL (ref 30.0–36.0)
MCV: 88.3 fl (ref 78.0–100.0)
MONO ABS: 0.6 10*3/uL (ref 0.1–1.0)
Monocytes Relative: 8.4 % (ref 3.0–12.0)
NEUTROS ABS: 4.7 10*3/uL (ref 1.4–7.7)
Neutrophils Relative %: 63.9 % (ref 43.0–77.0)
Platelets: 281 10*3/uL (ref 150.0–400.0)
RBC: 4.57 Mil/uL (ref 3.87–5.11)
RDW: 14 % (ref 11.5–15.5)
WBC: 7.4 10*3/uL (ref 4.0–10.5)

## 2014-12-14 LAB — LIPID PANEL
CHOL/HDL RATIO: 3
Cholesterol: 230 mg/dL — ABNORMAL HIGH (ref 0–200)
HDL: 70.4 mg/dL (ref >39.00)
LDL Cholesterol: 148 mg/dL — ABNORMAL HIGH (ref 0–99)
NONHDL: 159.6
Triglycerides: 57 mg/dL (ref 0.0–149.0)
VLDL: 11.4 mg/dL (ref 0.0–40.0)

## 2014-12-14 LAB — POCT URINALYSIS DIPSTICK
BILIRUBIN UA: NEGATIVE
Blood, UA: NEGATIVE
GLUCOSE UA: NEGATIVE
KETONES UA: NEGATIVE
LEUKOCYTES UA: NEGATIVE
NITRITE UA: NEGATIVE
PROTEIN UA: NEGATIVE
Spec Grav, UA: 1.015
Urobilinogen, UA: 0.2
pH, UA: 5.5

## 2014-12-14 LAB — TSH: TSH: 1.53 u[IU]/mL (ref 0.35–4.50)

## 2014-12-21 ENCOUNTER — Ambulatory Visit (INDEPENDENT_AMBULATORY_CARE_PROVIDER_SITE_OTHER): Payer: BLUE CROSS/BLUE SHIELD | Admitting: Family Medicine

## 2014-12-21 ENCOUNTER — Encounter: Payer: Self-pay | Admitting: Family Medicine

## 2014-12-21 VITALS — BP 158/93 | HR 91 | Temp 98.2°F | Ht 65.0 in | Wt 249.0 lb

## 2014-12-21 DIAGNOSIS — L03115 Cellulitis of right lower limb: Secondary | ICD-10-CM

## 2014-12-21 DIAGNOSIS — R739 Hyperglycemia, unspecified: Secondary | ICD-10-CM | POA: Diagnosis not present

## 2014-12-21 DIAGNOSIS — G8918 Other acute postprocedural pain: Secondary | ICD-10-CM | POA: Diagnosis not present

## 2014-12-21 DIAGNOSIS — E7800 Pure hypercholesterolemia, unspecified: Secondary | ICD-10-CM

## 2014-12-21 DIAGNOSIS — Z0000 Encounter for general adult medical examination without abnormal findings: Secondary | ICD-10-CM | POA: Diagnosis not present

## 2014-12-21 DIAGNOSIS — E669 Obesity, unspecified: Secondary | ICD-10-CM

## 2014-12-21 MED ORDER — HYDROCODONE-ACETAMINOPHEN 5-325 MG PO TABS: 1.0000 | ORAL_TABLET | Freq: Four times a day (QID) | ORAL | Status: DC | PRN

## 2014-12-21 MED ORDER — AMLODIPINE BESYLATE 5 MG PO TABS: 5.0000 mg | ORAL_TABLET | Freq: Every day | ORAL | Status: DC

## 2014-12-21 MED ORDER — DOXYCYCLINE HYCLATE 100 MG PO CAPS: 100.0000 mg | ORAL_CAPSULE | Freq: Two times a day (BID) | ORAL | Status: AC

## 2014-12-21 NOTE — Progress Notes (Signed)
   Subjective:    Patient ID: Dana [name], female    DOB: [birth year], 49 y.o.   MRN: [id number]  HPI 49 yr old female for a cpx and also to check her right foot. 4 days ago she noticed 2 red marks on the foot one morning with no idea of where they came from . No fever and she feels okay. However the foot has become painful and mildly swollen.    Review of Systems  Constitutional: Negative.   HENT: Negative.   Eyes: Negative.   Respiratory: Negative.   Cardiovascular: Negative.   Gastrointestinal: Negative.   Genitourinary: Negative for dysuria, urgency, frequency, hematuria, flank pain, decreased urine volume, enuresis, difficulty urinating, pelvic pain and dyspareunia.  Musculoskeletal: Negative.   Skin: Negative.   Neurological: Negative.   Psychiatric/Behavioral: Negative.        Objective:   Physical Exam  Constitutional: She is oriented to person, place, and time. She appears well-developed and well-nourished. No distress.  HENT:  Head: Normocephalic and atraumatic.  Right Ear: External ear normal.  Left Ear: External ear normal.  Nose: Nose normal.  Mouth/Throat: Oropharynx is clear and moist. No oropharyngeal exudate.  Eyes: Conjunctivae and EOM are normal. Pupils are equal, round, and reactive to light. No scleral icterus.  Neck: Normal range of motion. Neck supple. No JVD present. No thyromegaly present.  Cardiovascular: Normal rate, regular rhythm, normal heart sounds and intact distal pulses.  Exam reveals no gallop and no friction rub.   No murmur heard. Pulmonary/Chest: Effort normal and breath sounds normal. No respiratory distress. She has no wheezes. She has no rales. She exhibits no tenderness.  Abdominal: Soft. Bowel sounds are normal. She exhibits no distension and no mass. There is no tenderness. There is no rebound and no guarding.  Musculoskeletal: Normal range of motion. She exhibits no edema or tenderness.  Lymphadenopathy:    She has no cervical  adenopathy.  Neurological: She is alert and oriented to person, place, and time. She has normal reflexes. No cranial nerve deficit. She exhibits normal muscle tone. Coordination normal.  Skin: Skin is warm and dry. No rash noted. No erythema.  The dorsal right foot has 2 small puncture wounds which are tender. No swelling or erythema seen   Psychiatric: She has a normal mood and affect. Her behavior is normal. Judgment and thought content normal.          Assessment & Plan:  Well exam. She has an elevated glucose and lipids and there is a strong family hx of diabetes. We will refer her to Nutrition to help with diet. She needs to exercise and lose some weight. She probably has some spider bites on the foot that have become infected. Treat with Doxycycline.

## 2014-12-21 NOTE — Progress Notes (Signed)
Pre visit review using our clinic review tool, if applicable. No additional management support is needed unless otherwise documented below in the visit note. 

## 2014-12-23 ENCOUNTER — Encounter: Payer: BLUE CROSS/BLUE SHIELD | Admitting: Gynecology

## 2014-12-26 ENCOUNTER — Telehealth: Payer: Self-pay | Admitting: Family Medicine

## 2014-12-26 NOTE — Telephone Encounter (Signed)
Refill request for Alprazolam 0.5 mg take 1/2-1 tablet tid prn and [facility]

## 2014-12-26 NOTE — Telephone Encounter (Signed)
This is a duplicate, see previous note.  

## 2014-12-26 NOTE — Telephone Encounter (Signed)
Change Xanax to 1 mg to take tid prn anxiety, #90 with 5 rf

## 2014-12-27 MED ORDER — ALPRAZOLAM 1 MG PO TABS: 1.0000 mg | ORAL_TABLET | Freq: Three times a day (TID) | ORAL | Status: DC | PRN

## 2014-12-27 NOTE — Telephone Encounter (Signed)
[facility] 

## 2014-12-29 ENCOUNTER — Other Ambulatory Visit: Payer: Self-pay

## 2014-12-29 DIAGNOSIS — Z1231 Encounter for screening mammogram for malignant neoplasm of breast: Secondary | ICD-10-CM

## 2015-01-13 ENCOUNTER — Ambulatory Visit: Payer: BLUE CROSS/BLUE SHIELD | Admitting: Dietician

## 2015-01-23 ENCOUNTER — Encounter: Payer: Self-pay | Admitting: Family Medicine

## 2015-01-23 ENCOUNTER — Ambulatory Visit (INDEPENDENT_AMBULATORY_CARE_PROVIDER_SITE_OTHER): Payer: BLUE CROSS/BLUE SHIELD | Admitting: Family Medicine

## 2015-01-23 VITALS — BP 169/98 | HR 90 | Temp 99.0°F | Ht 65.0 in | Wt 247.0 lb

## 2015-01-23 DIAGNOSIS — L509 Urticaria, unspecified: Secondary | ICD-10-CM

## 2015-01-23 MED ORDER — PREDNISONE 10 MG PO TABS: ORAL_TABLET | ORAL | Status: DC

## 2015-01-23 NOTE — Progress Notes (Signed)
   Subjective:    Patient ID: Dana [name], female    DOB: 07/16/1966, 49 y.o.   MRN: [id number]  HPI Here for widespread red itchy tender spots on the skin. She was here on 12-21-14 for some red spots on the foot presumed to be spider bites and she was given Doxycycline. These did not respond much so she went to Urgent Care on 01-11-15 and was given Keflex. The original spots on the foot cleared up, but then she developed these itchy spots all over the body. She is not on any new medications otherwise. Taking Benadryl.    Review of Systems  Constitutional: Negative.   Respiratory: Negative.   Cardiovascular: Negative.   Skin: Positive for rash.       Objective:   Physical Exam  Constitutional: She appears well-developed and well-nourished. No distress.  Skin:  Widespread red macules or papules over the arms, legs, trunk, and even the scalp          Assessment & Plan:  Hives. Treat with a prednisone taper, also Zyrtec 10 mg bid and Zantac 75 mg bid.

## 2015-01-23 NOTE — Progress Notes (Signed)
Pre visit review using our clinic review tool, if applicable. No additional management support is needed unless otherwise documented below in the visit note. 

## 2015-01-30 ENCOUNTER — Ambulatory Visit
Admission: RE | Admit: 2015-01-30 | Discharge: 2015-01-30 | Disposition: A | Discharge status: Home / Self Care | Payer: BLUE CROSS/BLUE SHIELD | Source: Ambulatory Visit

## 2015-01-30 DIAGNOSIS — Z1231 Encounter for screening mammogram for malignant neoplasm of breast: Secondary | ICD-10-CM

## 2015-01-31 ENCOUNTER — Ambulatory Visit: Payer: Self-pay | Admitting: Internal Medicine

## 2015-02-06 ENCOUNTER — Telehealth: Payer: Self-pay | Admitting: Family Medicine

## 2015-02-06 DIAGNOSIS — G8918 Other acute postprocedural pain: Secondary | ICD-10-CM

## 2015-02-06 MED ORDER — HYDROCODONE-ACETAMINOPHEN 5-325 MG PO TABS: 1.0000 | ORAL_TABLET | Freq: Four times a day (QID) | ORAL | Status: DC | PRN

## 2015-02-06 NOTE — Telephone Encounter (Signed)
done

## 2015-02-06 NOTE — Telephone Encounter (Signed)
Patient would like a re-fill on HYDROcodone-acetaminophen (NORCO/VICODIN) 5-325 MG per tablet. °

## 2015-02-07 NOTE — Telephone Encounter (Signed)
Script is ready for pick up and I left a voice message.  

## 2015-02-16 ENCOUNTER — Other Ambulatory Visit: Payer: Self-pay | Admitting: Family Medicine

## 2015-03-16 ENCOUNTER — Telehealth: Payer: Self-pay | Admitting: Family Medicine

## 2015-03-16 MED ORDER — TRAMADOL HCL 50 MG PO TABS: ORAL_TABLET | ORAL | Status: DC

## 2015-03-16 NOTE — Telephone Encounter (Signed)
Pt went to dermatologist and had test done and she is allergic to hydrocodone. Pt would like to discuss with md. Can I use sda slot tomorrow?

## 2015-03-16 NOTE — Telephone Encounter (Signed)
Call in Tramadol 50 mg, 1 or 2 every 6 hours prn pain, #60 with 2 rf

## 2015-03-16 NOTE — Telephone Encounter (Signed)
Rx called in to pharmacy. Called and spoke with pt and pt is aware.

## 2015-03-16 NOTE — Telephone Encounter (Signed)
I spoke with pt and she had allergic reaction to Hydrocodone, caused hives. She stopped the medication however she would like something else for back pain. I did update chart and added this medication to her drug allergy list.

## 2015-04-06 ENCOUNTER — Telehealth: Payer: Self-pay | Admitting: Family Medicine

## 2015-04-06 NOTE — Telephone Encounter (Signed)
Pt is calling to let md know that tramadol is not helping the pain. Please advise

## 2015-04-07 NOTE — Telephone Encounter (Signed)
lmom for pt to sch appt °

## 2015-04-07 NOTE — Telephone Encounter (Signed)
Have her make an OV to decide what we need to do

## 2015-04-07 NOTE — Telephone Encounter (Signed)
Pt has been sch ok to use sda slot ok per sylvia

## 2015-04-14 ENCOUNTER — Encounter: Payer: Self-pay | Admitting: Family Medicine

## 2015-04-14 ENCOUNTER — Ambulatory Visit (INDEPENDENT_AMBULATORY_CARE_PROVIDER_SITE_OTHER): Payer: BLUE CROSS/BLUE SHIELD | Admitting: Family Medicine

## 2015-04-14 VITALS — HR 88 | Temp 98.4°F | Ht 65.0 in | Wt 243.0 lb

## 2015-04-14 DIAGNOSIS — I10 Essential (primary) hypertension: Secondary | ICD-10-CM

## 2015-04-14 DIAGNOSIS — M5440 Lumbago with sciatica, unspecified side: Secondary | ICD-10-CM

## 2015-04-14 MED ORDER — HYDROMORPHONE HCL 4 MG PO TABS: 4.0000 mg | ORAL_TABLET | Freq: Four times a day (QID) | ORAL | Status: DC | PRN

## 2015-04-14 MED ORDER — AMLODIPINE BESYLATE 5 MG PO TABS: 5.0000 mg | ORAL_TABLET | Freq: Every day | ORAL | Status: DC

## 2015-04-14 NOTE — Progress Notes (Signed)
   Subjective:    Patient ID: Dana [name], female    DOB: 11/14/1965, 49 y.o.   MRN: [id number]  HPI Here for refills of BP meds and also to discuss pain management. She has daily low back pain and had been taking Tramadol 100 mg at a time. However this has not been effective for her. Tylenol and Ibuprofen do not help. Hydrocodone causes severe hives so we plan to avoid the codeine family of drugs completely. She has tried PT and rehab of her back with no relief.    Review of Systems  Constitutional: Negative.   Respiratory: Negative.   Cardiovascular: Negative.   Musculoskeletal: Positive for back pain.       Objective:   Physical Exam  Constitutional: She is oriented to person, place, and time. She appears well-developed and well-nourished.  Cardiovascular: Normal rate, regular rhythm, normal heart sounds and intact distal pulses.   Pulmonary/Chest: Effort normal and breath sounds normal.  Musculoskeletal: She exhibits no edema.  Neurological: She is alert and oriented to person, place, and time.          Assessment & Plan:  Her HTNM is stable. For the back pain we will try Hydromorphone. Recheck prn

## 2015-04-14 NOTE — Progress Notes (Signed)
Pre visit review using our clinic review tool, if applicable. No additional management support is needed unless otherwise documented below in the visit note. 

## 2015-06-01 ENCOUNTER — Ambulatory Visit: Payer: Worker's Compensation

## 2015-06-01 ENCOUNTER — Ambulatory Visit (INDEPENDENT_AMBULATORY_CARE_PROVIDER_SITE_OTHER): Payer: Worker's Compensation | Admitting: Family Medicine

## 2015-06-01 VITALS — BP 120/90 | HR 94 | Temp 97.7°F | Resp 16 | Ht 63.0 in | Wt 247.0 lb

## 2015-06-01 DIAGNOSIS — M542 Cervicalgia: Secondary | ICD-10-CM

## 2015-06-01 DIAGNOSIS — M25551 Pain in right hip: Secondary | ICD-10-CM

## 2015-06-01 DIAGNOSIS — T148 Other injury of unspecified body region: Secondary | ICD-10-CM | POA: Diagnosis not present

## 2015-06-01 DIAGNOSIS — R0781 Pleurodynia: Secondary | ICD-10-CM

## 2015-06-01 DIAGNOSIS — M79641 Pain in right hand: Secondary | ICD-10-CM

## 2015-06-01 DIAGNOSIS — T148XXA Other injury of unspecified body region, initial encounter: Secondary | ICD-10-CM

## 2015-06-01 DIAGNOSIS — M25511 Pain in right shoulder: Secondary | ICD-10-CM

## 2015-06-01 MED ORDER — MELOXICAM 15 MG PO TABS: 15.0000 mg | ORAL_TABLET | Freq: Every day | ORAL | Status: DC

## 2015-06-01 MED ORDER — TRAMADOL HCL 50 MG PO TABS: 50.0000 mg | ORAL_TABLET | Freq: Three times a day (TID) | ORAL | Status: DC | PRN

## 2015-06-01 MED ORDER — METHOCARBAMOL 500 MG PO TABS: 500.0000 mg | ORAL_TABLET | Freq: Three times a day (TID) | ORAL | Status: DC | PRN

## 2015-06-01 NOTE — Patient Instructions (Signed)
Meloxicam tablets What is this medicine? MELOXICAM (mel OX i cam) is a non-steroidal anti-inflammatory drug (NSAID). It is used to reduce swelling and to treat pain. It may be used for osteoarthritis, rheumatoid arthritis, or juvenile rheumatoid arthritis. This medicine may be used for other purposes; ask your health care provider or pharmacist if you have questions. COMMON BRAND NAME(S): Mobic What should I tell my health care provider before I take this medicine? They need to know if you have any of these conditions: -asthma -cigarette smoker -coronary artery bypass graft (CABG) surgery within the past 2 weeks -drink more than 3 alcohol-containing drinks a day -heart disease or circulation problems such as heart failure or leg edema (fluid retention) -hemophilia or bleeding problems -high blood pressure -kidney disease -liver disease -stomach bleeding or ulcers -an unusual or allergic reaction to meloxicam, aspirin, other NSAIDs, other medicines, foods, dyes, or preservatives -pregnant or trying to get pregnant -breast-feeding How should I use this medicine? Take this medicine by mouth with a full glass of water. Follow the directions on the prescription label. Take this medicine in an upright or sitting position. If possible take bedtime doses at least 10 minutes before lying down. You can take it with or without food. If it upsets your stomach, take it with food. Take your medicine at regular intervals. Do not take it more often than directed. A special MedGuide will be given to you by the pharmacist with each prescription and refill. Be sure to read this information carefully each time. Talk to your pediatrician regarding the use of this medicine in children. Special care may be needed. Elderly patients over 65 years old may have a stronger reaction to this medicine and need smaller doses. Overdosage: If you think you have taken too much of this medicine contact a poison control center  or emergency room at once. NOTE: This medicine is only for you. Do not share this medicine with others. What if I miss a dose? If you miss a dose, take it as soon as you can. If it is almost time for your next dose, take only that dose. Do not take double or extra doses. What may interact with this medicine? -alcohol -aspirin -cidofovir -diuretics -lithium -medicines for high blood pressure -methotrexate -other drugs for inflammation like ketorolac, ibuprofen, and prednisone -pemetrexed -warfarin This list may not describe all possible interactions. Give your health care provider a list of all the medicines, herbs, non-prescription drugs, or dietary supplements you use. Also tell them if you smoke, drink alcohol, or use illegal drugs. Some items may interact with your medicine. What should I watch for while using this medicine? Tell your doctor or healthcare professional if your pain does not get better. Talk to your doctor before taking another medicine for pain. Do not treat yourself. This medicine does not prevent heart attack or stroke. If you take aspirin to prevent heart attack or stroke, talk with your doctor or health care professional. Do not take medicines such as ibuprofen and naproxen with this medicine. Side effects such as stomach upset, nausea, or ulcers may be more likely to occur. Many medicines available without a prescription should not be taken with this medicine. What side effects may I notice from receiving this medicine? Side effects that you should report to your doctor or health care professional as soon as possible: -black or bloody stools, blood in the urine or vomit -blurred vision -chest pain -difficulty breathing or wheezing -nausea or vomiting -skin rash, skin redness,   blistering or peeling skin, hives, or itching -slurred speech or weakness on one side of the body -swelling of eyelids, throat, lips -unexplained weight gain or swelling -unusually weak or  tired -yellowing of eyes or skin Side effects that usually do not require medical attention (report to your doctor or health care professional if they continue or are bothersome): -constipation or diarrhea -dizziness -gas or heartburn -stomach pain This list may not describe all possible side effects. Call your doctor for medical advice about side effects. You may report side effects to FDA at 1-800-FDA-1088. Where should I keep my medicine? Keep out of the reach of children. Store at room temperature between 15 and 30 degrees C (59 and 86 degrees F). Protect from moisture. Keep container tightly closed. Throw away any unused medicine after the expiration date. NOTE: This sheet is a summary. It may not cover all possible information. If you have questions about this medicine, talk to your doctor, pharmacist, or health care provider.  2015, Elsevier/Gold Standard. (2009-12-25 21:15:42) Methocarbamol tablets What is this medicine? METHOCARBAMOL (meth oh KAR ba mole) helps to relieve pain and stiffness in muscles caused by strains, sprains, or other injury to your muscles. This medicine may be used for other purposes; ask your health care provider or pharmacist if you have questions. COMMON BRAND NAME(S): Robaxin What should I tell my health care provider before I take this medicine? They need to know if you have any of these conditions: -kidney disease -seizures -an unusual or allergic reaction to methocarbamol, other medicines, foods, dyes, or preservatives -pregnant or trying to get pregnant -breast-feeding How should I use this medicine? Take this medicine by mouth with a full glass of water. Follow the directions on the prescription label. Take your medicine at regular intervals. Do not take your medicine more often than directed. Talk to your pediatrician regarding the use of this medicine in children. Special care may be needed. Overdosage: If you think you have taken too much of  this medicine contact a poison control center or emergency room at once. NOTE: This medicine is only for you. Do not share this medicine with others. What if I miss a dose? If you miss a dose, take it as soon as you can. If it is almost time for your next dose, take only the next dose. Do not take double or extra doses. What may interact with this medicine? -alcohol or medicines that contain alcohol -cholinesterase inhibitors like neostigmine, ambenonium, and pyridostigmine bromide -other medicines that cause drowsiness This list may not describe all possible interactions. Give your health care provider a list of all the medicines, herbs, non-prescription drugs, or dietary supplements you use. Also tell them if you smoke, drink alcohol, or use illegal drugs. Some items may interact with your medicine. What should I watch for while using this medicine? You may get drowsy or dizzy. Do not drive, use machinery, or do anything that needs mental alertness until you know how this medicine affects you. Do not stand or sit up quickly, especially if you are an older patient. This reduces the risk of dizzy or fainting spells. Alcohol may interfere with the effect of this medicine. Avoid alcoholic drinks. What side effects may I notice from receiving this medicine? Side effects that you should report to your doctor or health care professional as soon as possible: -allergic reactions like skin rash, itching or hives, swelling of the face, lips, or tongue -blurred vision or changes in vision -confusion -fainting spells -fever -  nausea or vomiting -seizures Side effects that usually do not require medical attention (report to your doctor or health care professional if they continue or are bothersome): -dizziness -drowsiness -headache -metallic taste This list may not describe all possible side effects. Call your doctor for medical advice about side effects. You may report side effects to FDA at  1-800-FDA-1088. Where should I keep my medicine? Keep out of the reach of children. Store at room temperature between 20 and 25 degrees C (68 and 77 degrees F). Keep container tightly closed. Throw away any unused medicine after the expiration date. NOTE: This sheet is a summary. It may not cover all possible information. If you have questions about this medicine, talk to your doctor, pharmacist, or health care provider.  2015, Elsevier/Gold Standard. (2008-03-21 16:02:03) Tramadol tablets What is this medicine? TRAMADOL (TRA ma dole) is a pain reliever. It is used to treat moderate to severe pain in adults. This medicine may be used for other purposes; ask your health care provider or pharmacist if you have questions. COMMON BRAND NAME(S): Ultram What should I tell my health care provider before I take this medicine? They need to know if you have any of these conditions: -brain tumor -depression -drug abuse or addiction -head injury -if you frequently drink alcohol containing drinks -kidney disease or trouble passing urine -liver disease -lung disease, asthma, or breathing problems -seizures or epilepsy -suicidal thoughts, plans, or attempt; a previous suicide attempt by you or a family member -an unusual or allergic reaction to tramadol, codeine, other medicines, foods, dyes, or preservatives -pregnant or trying to get pregnant -breast-feeding How should I use this medicine? Take this medicine by mouth with a full glass of water. Follow the directions on the prescription label. If the medicine upsets your stomach, take it with food or milk. Do not take more medicine than you are told to take. Talk to your pediatrician regarding the use of this medicine in children. Special care may be needed. Overdosage: If you think you have taken too much of this medicine contact a poison control center or emergency room at once. NOTE: This medicine is only for you. Do not share this medicine with  others. What if I miss a dose? If you miss a dose, take it as soon as you can. If it is almost time for your next dose, take only that dose. Do not take double or extra doses. What may interact with this medicine? Do not take this medicine with any of the following medications: -MAOIs like Carbex, Eldepryl, Marplan, Nardil, and Parnate This medicine may also interact with the following medications: -alcohol or medicines that contain alcohol -antihistamines -benzodiazepines -bupropion -carbamazepine or oxcarbazepine -clozapine -cyclobenzaprine -digoxin -furazolidone -linezolid -medicines for depression, anxiety, or psychotic disturbances -medicines for migraine headache like almotriptan, eletriptan, frovatriptan, naratriptan, rizatriptan, sumatriptan, zolmitriptan -medicines for pain like pentazocine, buprenorphine, butorphanol, meperidine, nalbuphine, and propoxyphene -medicines for sleep -muscle relaxants -naltrexone -phenobarbital -phenothiazines like perphenazine, thioridazine, chlorpromazine, mesoridazine, fluphenazine, prochlorperazine, promazine, and trifluoperazine -procarbazine -warfarin This list may not describe all possible interactions. Give your health care provider a list of all the medicines, herbs, non-prescription drugs, or dietary supplements you use. Also tell them if you smoke, drink alcohol, or use illegal drugs. Some items may interact with your medicine. What should I watch for while using this medicine? Tell your doctor or health care professional if your pain does not go away, if it gets worse, or if you have new or a different type of  pain. You may develop tolerance to the medicine. Tolerance means that you will need a higher dose of the medicine for pain relief. Tolerance is normal and is expected if you take this medicine for a long time. Do not suddenly stop taking your medicine because you may develop a severe reaction. Your body becomes used to the  medicine. This does NOT mean you are addicted. Addiction is a behavior related to getting and using a drug for a non-medical reason. If you have pain, you have a medical reason to take pain medicine. Your doctor will tell you how much medicine to take. If your doctor wants you to stop the medicine, the dose will be slowly lowered over time to avoid any side effects. You may get drowsy or dizzy. Do not drive, use machinery, or do anything that needs mental alertness until you know how this medicine affects you. Do not stand or sit up quickly, especially if you are an older patient. This reduces the risk of dizzy or fainting spells. Alcohol can increase or decrease the effects of this medicine. Avoid alcoholic drinks. You may have constipation. Try to have a bowel movement at least every 2 to 3 days. If you do not have a bowel movement for 3 days, call your doctor or health care professional. Your mouth may get dry. Chewing sugarless gum or sucking hard candy, and drinking plenty of water may help. Contact your doctor if the problem does not go away or is severe. What side effects may I notice from receiving this medicine? Side effects that you should report to your doctor or health care professional as soon as possible: -allergic reactions like skin rash, itching or hives, swelling of the face, lips, or tongue -breathing difficulties, wheezing -confusion -itching -light headedness or fainting spells -redness, blistering, peeling or loosening of the skin, including inside the mouth -seizures Side effects that usually do not require medical attention (report to your doctor or health care professional if they continue or are bothersome): -constipation -dizziness -drowsiness -headache -nausea, vomiting This list may not describe all possible side effects. Call your doctor for medical advice about side effects. You may report side effects to FDA at 1-800-FDA-1088. Where should I keep my medicine? Keep  out of the reach of children. Store at room temperature between 15 and 30 degrees C (59 and 86 degrees F). Keep container tightly closed. Throw away any unused medicine after the expiration date. NOTE: This sheet is a summary. It may not cover all possible information. If you have questions about this medicine, talk to your doctor, pharmacist, or health care provider.  2015, Elsevier/Gold Standard. (2010-05-16 11:55:44)

## 2015-06-01 NOTE — Progress Notes (Signed)
Chief Complaint:  Chief Complaint  Patient presents with  . workers comp. 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 Flank Pain    right neck extending down her body    HPI: Dana [name] is a 49 y.o. 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 She denies any numbness , weakness or tingling. No incontinence. No hematuria. No prior injuries to her right side of body, she had back surgery , discetomy by Dr Dana [name] in 2006. She currently has no back pain. She had no LOC, no vision changes or headaches, nausea, vomiting. Sharp pains, constant 7/10 pain   ROS: The patient denies fevers, chills, night sweats, unintentional weight loss, chest pain, palpitations, wheezing, dyspnea on exertion, nausea, vomiting, abdominal pain, dysuria, hematuria, melena, numbness, weakness, or tingling.   All other systems have been reviewed and were otherwise negative with the exception of those mentioned in the HPI and as above.    PHYSICAL EXAM: Filed Vitals:   06/01/15 1545  BP: 120/90  Pulse: 94  Temp: 97.7 F (36.5 C)  Resp: 16   Body mass index is 43.76 kg/(m^2).   General: Alert, mild acute distress , obese AA female, uncomfortable sitting HEENT:  Normocephalic, atraumatic, oropharynx patent. EOMI, PERRLA Cardiovascular:  Regular rate and rhythm, no rubs murmurs or gallops.  No Carotid bruits, radial pulse intact. No pedal edema.  Respiratory: Clear to auscultation bilaterally.  No wheezes, rales, or rhonchi.  No cyanosis, no use of accessory musculature Abdominal: No organomegaly, abdomen is soft and non-tender, positive bowel sounds. No masses. Skin: No rashes. Neurologic: Facial musculature symmetric. Psychiatric: Patient acts appropriately throughout our interaction. Musculoskeletal: Gait antalgic.  No edema, tenderness Neck exam -neg spurling, no defromities, tender bilaterally with ROM in the Para msk , ore right than left Shoulder No deformity, no hypertrophy/atrophy, no erythema, no fluid, no wounds ROM and exam limited due to pain Nontender at [facility] jt 5/5 strength, 2/2 triceps and biceps DTRs Good grip strength Right hip -tender, ER and IR normal Right knee- No deformity, Neg ballotment Diffuse tenderness Decrease AROM, 5/5 strength, 2/2 DTRs ankle ,  Neg Lachman, +/-medial jt line tenderness, neg McMurray L spine nontender,  Straight leg negative    EKG/XRAY:   Primary read interpreted by Dr. [name] at [name]. c spine-+ djd Shoulder-? OA vs acute fracture of lateral humeral head Hand-negative Chest and rib-normal Hip-negative for fracture or dislocation  ASSESSMENT/PLAN: Encounter Diagnoses  Name Primary?  . Neck pain Yes  . Right shoulder pain   . Right hip pain   . Rib pain on right side   . Right hand pain    The [place] controlled substance DB checked, no illegal activities She has hydrocodone and oxycodone allergies. I do not feel we should start off with Dilaudid , she states that has worked for her in the past and herPCP DR Sarajane Jews has given that to her She has taken tramadol but only 1 tab every 6 hours, we will try 2 tabs q 8 hrs She will also be given robaxin and mobic Precautions given Fu on Sunday for recheck, refer to PT at that time prn   Gross sideeffects, risk and benefits, and alternatives of medications d/w patient. Patient is aware that all medications  have potential sideeffects and we are unable to predict every sideeffect or drug-drug interaction that may occur.  Hinda Lindor DO  06/01/2015 6:05 PM

## 2015-06-04 ENCOUNTER — Ambulatory Visit (INDEPENDENT_AMBULATORY_CARE_PROVIDER_SITE_OTHER): Payer: Worker's Compensation | Admitting: Family Medicine

## 2015-06-04 VITALS — BP 118/84 | HR 81 | Temp 98.3°F | Resp 18 | Ht 63.0 in | Wt 245.4 lb

## 2015-06-04 DIAGNOSIS — S46911S Strain of unspecified muscle, fascia and tendon at shoulder and upper arm level, right arm, sequela: Secondary | ICD-10-CM | POA: Diagnosis not present

## 2015-06-04 DIAGNOSIS — S39012S Strain of muscle, fascia and tendon of lower back, sequela: Secondary | ICD-10-CM

## 2015-06-04 DIAGNOSIS — S161XXS Strain of muscle, fascia and tendon at neck level, sequela: Secondary | ICD-10-CM

## 2015-06-04 NOTE — Patient Instructions (Signed)
Since you're showing improvement at this time, we will give you a couple more days off. I would like you to return on Wednesday, September 21 for follow-up visit and possible return to work on Thursday. You may continue the tramadol. I am ordering physical therapy referral today.

## 2015-06-04 NOTE — Progress Notes (Signed)
@[facility]FCLOGO @  This chart was scribed for Robyn Haber, MD by Thea Alken, ED Scribe. This Dana was seen in room 3 and the Dana's care was started at 2:02 PM.  Dana [name] MRN: [id number], DOB: 05/26/1966, 49 y.o. Date of Encounter: 06/04/2015, 2:01 PM  Primary Physician: [name], MD  Chief Complaint:  Chief Complaint  Dana presents with  . Follow-up    for neck and shoulder pain.    HPI: 49 y.o. year old female with history below presents for a follow up regarding fall that occurred at work. Pt was seen here 3 days ago by Dr. 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 Since last visit she still has irritation and discomfort to right neck that radiates to right shoulder. She has pain with certain movement of the right shoulder and is unable to lift shoulder entirely. She has been taking tramadol with relief to pain. She she no longer has hip pain. She is supposed to return to work tomorrow, a 12 hour shift consisting a lot of standing.    Review of Systems: Constitutional: negative for chills, fever, night sweats, weight changes, or fatigue  HEENT: negative for vision changes, hearing loss, congestion, rhinorrhea, ST, epistaxis, or sinus pressure Cardiovascular: negative for chest pain or palpitations Respiratory: negative for hemoptysis, wheezing, shortness of breath, or cough Abdominal: negative for abdominal pain, nausea, vomiting, diarrhea, or constipation Dermatological: negative for rash Neurologic: negative for headache, dizziness, or syncope All other systems reviewed and are otherwise negative with the exception to those above and in the HPI.   Physical Exam: Blood pressure 118/84, pulse 81, temperature 98.3 F (36.8 C), temperature source Oral, resp. rate 18, height 5\' 3"  (1.6 m), weight 245 lb 6.4 oz (111.313 kg), last menstrual period 08/17/2012, SpO2 99 %., Body mass index is  43.48 kg/(m^2). General: Well developed, well nourished, in no acute distress. Head: Normocephalic, atraumatic, eyes without discharge, sclera non-icteric, nares are without discharge. Bilateral auditory canals clear, TM's are without perforation, pearly grey and translucent with reflective cone of light bilaterally. Oral cavity moist, posterior pharynx without exudate, erythema, peritonsillar abscess, or post nasal drip.  Neck: Supple. No thyromegaly. Full ROM. No lymphadenopathy. Lungs: Clear bilaterally to auscultation without wheezes, rales, or rhonchi. Breathing is unlabored. Heart: RRR with S1 S2. No murmurs, rubs, or gallops appreciated. Abdomen: Soft, non-tender, non-distended with normoactive bowel sounds. No hepatomegaly. No rebound/guarding. No obvious abdominal masses. Msk:  Strength and tone normal for age.  She has diminished abduction of right shoulder. Good ROM of the neck and torso. With good strength of upper extremities . No rashes swelling or bony abnormality. Extremities/Skin: Warm and dry. No clubbing or cyanosis. No edema. No rashes or suspicious lesions. Neuro: Alert and oriented X 3. Moves all extremities spontaneously. Gait is normal. CNII-XII grossly in tact. Dana has normal reflexes in her biceps and triceps areas. Psych:  Responds to questions appropriately with a normal affect.     ASSESSMENT AND PLAN:  49 y.o. year old female with some mild-to-moderate muscle strain. Because she's taking pain medicine, she needs to stay out of work for the next couple days. She does seem to be improving so hopefully by Wednesday we can get her back to work. I have written a work note clearing her for the next 2 days, and I told her I want her to come back on Wednesday for final recheck.  She'll  continue the Ultram and I will order physical therapy evaluation and treatment that may be necessary if she is not better completely by Wednesday.  By signing my name below, I, Raven Small,  attest that this documentation has been prepared under the direction and in the presence of Robyn Haber, MD.  Electronically Signed: Thea Alken, ED Scribe. 06/04/2015. 2:16 PM.  Signed, Robyn Haber, MD 06/04/2015 2:01 PM

## 2015-06-06 ENCOUNTER — Telehealth: Payer: Self-pay | Admitting: Family Medicine

## 2015-06-06 NOTE — Telephone Encounter (Signed)
Pt needs new rx hydromorphone °

## 2015-06-07 ENCOUNTER — Ambulatory Visit (INDEPENDENT_AMBULATORY_CARE_PROVIDER_SITE_OTHER): Payer: Worker's Compensation | Admitting: Emergency Medicine

## 2015-06-07 VITALS — BP 132/80 | HR 82 | Temp 98.3°F | Resp 17 | Ht 63.0 in | Wt 246.0 lb

## 2015-06-07 DIAGNOSIS — S161XXD Strain of muscle, fascia and tendon at neck level, subsequent encounter: Secondary | ICD-10-CM | POA: Diagnosis not present

## 2015-06-07 DIAGNOSIS — S46911D Strain of unspecified muscle, fascia and tendon at shoulder and upper arm level, right arm, subsequent encounter: Secondary | ICD-10-CM | POA: Diagnosis not present

## 2015-06-07 MED ORDER — HYDROMORPHONE HCL 4 MG PO TABS: 4.0000 mg | ORAL_TABLET | Freq: Four times a day (QID) | ORAL | Status: DC | PRN

## 2015-06-07 NOTE — Telephone Encounter (Signed)
Pt aware to pick up rx on Hydromorphine

## 2015-06-07 NOTE — Progress Notes (Addendum)
Subjective:  This chart was scribed for Dana Jordan MD,  by [name], at [facility].  This patient was seen in room 8  and the patient's care was started at 3:43 PM.   Chief Complaint  Patient presents with  . Follow-up    neck pain, shoulder pain WC     Patient ID: Dana [name], female    DOB: June 29, 1966, 49 y.o.   MRN: [id number]  HPI  HPI Comments: Dana [name] is a 49 y.o. female who presents to the [facility] for a follow up regarding her neck pain that started after a fall that occurred at work 6 days ago.  Patient was seen by Dr. [name] and had her x rays done here on the 15th by Dr. [name].  There were no concerns at that time but was told that it was "strained".  She has been taking her muscle relaxors Robaxin and Mobic as well as Tramadol. She will be starting her therapy on Monday.  She has been at her current job for a month.  She has no other complaints or concerns today.  She has been under pain management.    --- Patients x-rays of her shoulder were normal c-spine- saw degenerative disc disease, multiple levels  Rib films and hand films were normal.    Patient Active Problem List   Diagnosis Date Noted  . Hyperglycemia 12/21/2014  . Mastodynia, female 11/30/2014  . S/P excision of fibroadenoma of breast 11/30/2014  . Angioedema of lips 04/07/2014  . Fibroadenoma of right breast 03/07/2014  . Pelvic pain in female 06/19/2012  . Menorrhagia 06/11/2012  . SUI (stress urinary incontinence, female) 06/11/2012  . HTN (hypertension) 06/11/2012  . IBS (irritable bowel syndrome) - diarrhea predominant 03/17/2012  . MICROSCOPIC HEMATURIA 11/30/2010  . BREAST PAIN, RIGHT 11/30/2010  . BREAST MASS, RIGHT 01/12/2010  . HYPERCHOLESTEROLEMIA 12/07/2007  . CIGARETTE SMOKER 12/07/2007  . DEGENERATIVE JOINT DISEASE 12/07/2007  . Low back pain with sciatica 12/07/2007  . HEADACHE 12/07/2007  . Obesity 08/03/2007  .  ANXIETY 08/03/2007   Past Medical History  Diagnosis Date  . Tobacco use disorder   . Mild hypertension   . Hypercholesterolemia   . Obesity   . Breast mass, right   . DJD (degenerative joint disease)   . Back pain with radiation   . Headache(784.0)   . Anxiety   . Groin abscess   . IBS (irritable bowel syndrome)   . History of IBS   . Lactose intolerance    Past Surgical History  Procedure Laterality Date  . Tubal ligation    . Lumbar disc surgery    . Finger surgery      Right index-excision of mass   . Breast lumpectomy    . Foot surgery    . Laparoscopic hysterectomy  09/03/2012    Procedure: HYSTERECTOMY TOTAL LAPAROSCOPIC;  Surgeon: [name], MD;  Location: [facility];  Service: Gynecology;  Laterality: N/A;  . Bilateral salpingectomy  09/03/2012    Procedure: BILATERAL SALPINGECTOMY;  Surgeon: [name], MD;  Location: [facility];  Service: Gynecology;  Laterality: Bilateral;  . Esophagogastroduodenoscopy    . Colonoscopy w/ biopsies    . Back surgery    . Abdominal hysterectomy    . Breast biopsy Right 04/07/2014    Procedure: REMOVAL RIGHT BREAST MASS WITH WIRE LOCALIZATION;  Surgeon: [name], MD;  Location: [facility];  Service: General;  Laterality: Right;   Allergies  Allergen Reactions  . Hydrocodone     hives  . Lisinopril   . Penicillins Itching and Swelling   Prior to Admission medications   Medication Sig Start Date End Date Taking? Authorizing Provider  ALPRAZolam [name]) 1 MG tablet Take 1 tablet (1 mg total) by mouth 3 (three) times daily as needed for anxiety. 12/27/14  Yes [name], MD  amLODipine (NORVASC) 5 MG tablet Take 1 tablet (5 mg total) by mouth daily. 04/14/15  Yes [name], MD  meloxicam (MOBIC) 15 MG tablet Take 1 tablet (15 mg total) by mouth daily. Take with food, no other NSAIDs 06/01/15  Yes [name], DO  methocarbamol (ROBAXIN) 500 MG tablet Take 1 tablet (500 mg total) by mouth every 8 (eight) hours as needed  for muscle spasms. 06/01/15  Yes [name], DO  traMADol (ULTRAM) 50 MG tablet Take 1 tablet (50 mg total) by mouth every 8 (eight) hours as needed. 06/01/15  Yes [name], DO  triamcinolone cream (KENALOG) 0.1 % Apply 1 application topically 2 (two) times daily. 03/10/15  Yes Historical Provider, MD  HYDROmorphone (DILAUDID) 4 MG tablet Take 1 tablet (4 mg total) by mouth every 6 (six) hours as needed for severe pain. Patient not taking: Reported on 06/07/2015 06/07/15   [name], MD   Social History   Social History  . Marital Status: Married    Spouse Name: Lurleen Soltero  . Number of Children: 4  . Years of Education: N/A   Occupational History  . [name]    Social History Main Topics  . Smoking status: Former Smoker    Types: Cigarettes  . Smokeless tobacco: Never Used     Comment: smokes 1 cig daily  . Alcohol Use: 0.0 oz/week    0 Standard drinks or equivalent per week     Comment: occ  . Drug Use: No  . Sexual Activity: Yes    Birth Control/ Protection: Surgical     Comment: tubal ligation   Other Topics Concern  . Not on file   Social History Narrative   Married - lives with husband 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-law   Works in [place], [place], [place] (twins)   Grandchildren - 4   Updated 10/12/2013              Review of Systems  Constitutional: Negative for fever and chills.  Eyes: Negative for pain, discharge, redness and itching.  Respiratory: Negative for cough and choking.   Cardiovascular: Negative for chest pain.  Gastrointestinal: Negative for nausea and vomiting.  Musculoskeletal: Positive for myalgias and neck pain.       Objective:   Physical Exam Filed Vitals:   06/07/15 1518  BP: 132/80  Pulse: 82  Temp: 98.3 F (36.8 C)  TempSrc: Oral  Resp: 17  Height: 5\' 3"  (1.6 m)  Weight: 246 lb (111.585 kg)  SpO2: 95%     CONSTITUTIONAL: Well developed/well nourished HEAD: Normocephalic/atraumatic EYES:  EOMI/PERRL ENMT: Mucous membranes moist NECK: supple no meningeal signs SPINE/BACK:she has exquisite tenderness to very light touch over the mid cervical spine and bilateral trapezius muscles, reflexes are trace to 1+ and she has no focal weakness.  LUNGS: Lungs are clear to auscultation bilaterally, no apparent distress EXTREMITIES: pulses normal/equal, full ROM     Assessment & Plan:  I have made a referral to an orthopedist. I feel the greatest  chance of success to improve her neck and shoulder discomfort will be through physical therapy. She is all rescheduled for physical therapy to start Monday. We'll also make orthopedic referral so we can transition physical therapy through their office.I personally performed the services described in this documentation, which was scribed in my presence. The recorded information has been reviewed and is accurate.

## 2015-06-07 NOTE — Telephone Encounter (Signed)
done

## 2015-06-08 NOTE — Telephone Encounter (Signed)
Sunday Spillers please close this encouter

## 2015-06-14 ENCOUNTER — Ambulatory Visit (INDEPENDENT_AMBULATORY_CARE_PROVIDER_SITE_OTHER): Payer: Worker's Compensation | Admitting: Family Medicine

## 2015-06-14 VITALS — BP 160/98 | HR 85 | Temp 98.4°F | Resp 16 | Ht 63.0 in | Wt 249.0 lb

## 2015-06-14 DIAGNOSIS — S46911D Strain of unspecified muscle, fascia and tendon at shoulder and upper arm level, right arm, subsequent encounter: Secondary | ICD-10-CM

## 2015-06-14 DIAGNOSIS — S161XXD Strain of muscle, fascia and tendon at neck level, subsequent encounter: Secondary | ICD-10-CM | POA: Diagnosis not present

## 2015-06-14 NOTE — Progress Notes (Addendum)
Subjective:  This chart was scribed for Delman Cheadle, MD by [facility], medical scribe at [facility].The patient was seen in exam room 01 and the patient's care was started at 5:06 PM.    Patient ID: Dana [name], female    DOB: 1966-08-14, 49 y.o.   MRN: [id number] Chief Complaint  Patient presents with  . Follow-up    neck and rt. shoulder pain, Workers Comp    HPI  HPI Comments: Dana [name] is a 49 y.o. female who presents to [facility] for a workers comp follow up. At work, she slipped on pills that were on the floor injuring her neck, right shoulder, and hip. Hx of back surgery by Dr. Louanne Skye in 2006 X-ray of right shoulder, hand, ribs, hip and c-spine at her initial visit. C-spine showed multi-level DDD. Thought possible for body in right chest wall. 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 Robaxin, mobic and tramadol for relief. Recheck in 3 days and consider PT referral.  Kept out of work due to use of pain medications. PCP has her on dilaudid for chronic pain management. Pain continued, so she was referred to PT at [facility] PT started two days ago. She was referred to ortho. Has not been called for orthopedist appointment. Today, she is doing ok, unable to stay out of work. Neck is still bothering her. Working on increasing her ROM of the right shoulder.  Past Medical History  Diagnosis Date  . Tobacco use disorder   . Mild hypertension   . Hypercholesterolemia   . Obesity   . Breast mass, right   . DJD (degenerative joint disease)   . Back pain with radiation   . Headache(784.0)   . Anxiety   . Groin abscess   . IBS (irritable bowel syndrome)   . History of IBS   . Lactose intolerance    Current Outpatient Prescriptions on File Prior to Visit  Medication Sig Dispense Refill  . ALPRAZolam (XANAX) 1 MG tablet Take 1 tablet (1 mg total) by mouth 3 (three) times daily as needed for anxiety. 90 tablet 5  . amLODipine (NORVASC) 5  MG tablet Take 1 tablet (5 mg total) by mouth daily. 90 tablet 3  . meloxicam (MOBIC) 15 MG tablet Take 1 tablet (15 mg total) by mouth daily. Take with food, no other NSAIDs 30 tablet 0  . methocarbamol (ROBAXIN) 500 MG tablet Take 1 tablet (500 mg total) by mouth every 8 (eight) hours as needed for muscle spasms. 30 tablet 0  . traMADol (ULTRAM) 50 MG tablet Take 1 tablet (50 mg total) by mouth every 8 (eight) hours as needed. 30 tablet 0  . triamcinolone cream (KENALOG) 0.1 % Apply 1 application topically 2 (two) times daily.  3   No current facility-administered medications on file prior to visit.   Allergies  Allergen Reactions  . Hydrocodone     hives  . Lisinopril   . Penicillins Itching and Swelling   Review of Systems  Constitutional: Positive for activity change. Negative for fever, chills, appetite change and unexpected weight change.  Gastrointestinal: Negative for abdominal pain.  Genitourinary: Negative for difficulty urinating.  Musculoskeletal: Positive for myalgias, back pain, arthralgias and neck stiffness. Negative for joint swelling and gait problem.  Skin: Negative for color change and rash.  Neurological: Positive for weakness. Negative for numbness.      Objective:  BP 160/98 mmHg  Pulse 85  Temp(Src) 98.4  F (36.9 C) (Oral)  Resp 16  Ht 5\' 3"  (1.6 m)  Wt 249 lb (112.946 kg)  BMI 44.12 kg/m2  SpO2 97%  LMP 08/17/2012 Physical Exam  Constitutional: She is oriented to person, place, and time. She appears well-developed and well-nourished. No distress.  HENT:  Head: Normocephalic and atraumatic.  Eyes: Pupils are equal, round, and reactive to light.  Neck: Normal range of motion.  Mildly restricted Cervical ROM.  Cardiovascular: Normal rate and regular rhythm.   Pulmonary/Chest: Effort normal. No respiratory distress.  Musculoskeletal: Normal range of motion.  ROM in right shoulder mildly restricted due to pain at extremities.Strength 5/5 bilateral but  restricted due to pain. Grasp in opposition is 5/5   Neurological: She is alert and oriented to person, place, and time.  Reflex Scores:      Tricep reflexes are 2+ on the right side and 2+ on the left side.      Bicep reflexes are 2+ on the right side and 2+ on the left side.      Brachioradialis reflexes are 2+ on the right side and 2+ on the left side. Skin: Skin is warm and dry.  Psychiatric: She has a normal mood and affect. Her behavior is normal.  Nursing note and vitals reviewed.     Assessment & Plan:  Pt has PT tomorrow and will need Friday to apply RICE after PT. So will be ok to return to work at the next scheduled shift. Pt has not heard from orthopedics re: referral appt, this may have been closed in error when PT referral was made.   1. Cervical strain, acute, subsequent encounter   2. Right shoulder strain, subsequent encounter      I personally performed the services described in this documentation, which was scribed in my presence. The recorded information has been reviewed and considered, and addended by me as needed.  Delman Cheadle, MD MPH    By signing my name below, I, Nadim Abuhashem, attest that this documentation has been prepared under the direction and in the presence of Delman Cheadle, MD.  Electronically Signed: Lora Havens, medical scribe. 06/14/2015, 5:14 PM.

## 2015-06-21 ENCOUNTER — Ambulatory Visit (INDEPENDENT_AMBULATORY_CARE_PROVIDER_SITE_OTHER): Payer: Worker's Compensation | Admitting: Physician Assistant

## 2015-06-21 VITALS — BP 118/82 | HR 78 | Temp 98.4°F | Resp 18 | Ht 63.0 in | Wt 249.6 lb

## 2015-06-21 DIAGNOSIS — S46911D Strain of unspecified muscle, fascia and tendon at shoulder and upper arm level, right arm, subsequent encounter: Secondary | ICD-10-CM | POA: Diagnosis not present

## 2015-06-21 DIAGNOSIS — S161XXD Strain of muscle, fascia and tendon at neck level, subsequent encounter: Secondary | ICD-10-CM | POA: Diagnosis not present

## 2015-06-21 NOTE — Progress Notes (Signed)
Dana [name] [birth year] 49 y.o.   Chief Complaint  Patient presents with  . Follow-up    for neck and shoulder injury, workers comp.      Date of Injury: 06/01/15  History of Present Illness: Patient here for follow up of a workers comp injury that occurred on 9/15.  She has been going to physical therapy and reports she is 20-30 percent better and would like to complete those sessions.  She feels that she is ready to go back to work and also feels that she no longer needs follow up at [facility] as long as she can complete her therapy. Denies bowel and bladder incontinence.  Denies UE paresthesia.  No weakness.     Review of Systems  Constitutional: Negative for fever and chills.  Respiratory: Negative for cough.   Cardiovascular: Negative for chest pain.  Gastrointestinal: Negative for nausea.  Musculoskeletal: Positive for neck pain.  Skin: Negative for rash.  Neurological: Negative for dizziness and headaches.      Allergies  Allergen Reactions  . Hydrocodone     hives  . Lisinopril   . Penicillins Itching and Swelling     Current medications reviewed and updated. Past medical history, family history, social history have been reviewed and updated.  Filed Vitals:   06/21/15 1702  BP: 118/82  Pulse: 78  Temp: 98.4 F (36.9 C)  Resp: 18     Physical Exam  Constitutional: She is oriented to person, place, and time.  Neck: Normal range of motion. No spinous process tenderness and no muscular tenderness present. No rigidity. No edema, no erythema and normal range of motion present.  Cardiovascular: Normal rate.   Pulmonary/Chest: Effort normal.  Musculoskeletal: Normal range of motion.       Right shoulder: Normal. She exhibits normal range of motion, no tenderness and no bony tenderness.       Cervical back: She exhibits normal range of motion, no tenderness, no bony tenderness and no pain.  Neurological: She is alert and oriented to person, place, and time. She  displays no weakness and normal speech. No cranial nerve deficit. Gait normal. Gait normal.  Reflex Scores:      Tricep reflexes are 2+ on the right side and 2+ on the left side.      Bicep reflexes are 2+ on the right side and 2+ on the left side.      Brachioradialis reflexes are 2+ on the right side and 2+ on the left side. Skin: Skin is warm and dry.  Psychiatric: Mood, memory, affect and judgment normal.     Manna was seen today for follow-up.  Diagnoses and all orders for this visit:  Cervical strain, acute, subsequent encounter: Patient improving significantly with PT.  Letter composed clearing her of follow up at [facility].  She is to complete 12 sessions of PT and her physical restrictions remain in place until her therapy is over.  (See letter)  Right shoulder strain, subsequent encounter

## 2015-07-02 ENCOUNTER — Other Ambulatory Visit: Payer: Self-pay | Admitting: Family Medicine

## 2015-07-18 ENCOUNTER — Telehealth: Payer: Self-pay | Admitting: Family Medicine

## 2015-07-18 MED ORDER — HYDROMORPHONE HCL 4 MG PO TABS: 4.0000 mg | ORAL_TABLET | Freq: Four times a day (QID) | ORAL | Status: DC | PRN

## 2015-07-18 NOTE — Telephone Encounter (Signed)
done

## 2015-07-18 NOTE — Telephone Encounter (Signed)
Pt needs new rx hydromorphone °

## 2015-07-18 NOTE — Telephone Encounter (Signed)
Script is ready for pick up, tried to reach pt and now answer.

## 2015-09-04 ENCOUNTER — Telehealth: Payer: Self-pay | Admitting: Family Medicine

## 2015-09-04 MED ORDER — HYDROMORPHONE HCL 4 MG PO TABS: 4.0000 mg | ORAL_TABLET | Freq: Four times a day (QID) | ORAL | Status: DC | PRN

## 2015-09-04 NOTE — Telephone Encounter (Signed)
Pt called saying she needs a refill of Hydromorphone. Please give her a call if you have questions.  Pt's ph# [phone] Thank you.

## 2015-09-04 NOTE — Telephone Encounter (Signed)
Rx is ready for pick up. Voicemail was left

## 2015-09-04 NOTE — Telephone Encounter (Signed)
done

## 2015-09-19 ENCOUNTER — Telehealth: Payer: Self-pay | Admitting: Family Medicine

## 2015-09-19 NOTE — Telephone Encounter (Signed)
Patient Name: Dana [name]  DOB: September 05, 1966    Initial Comment Caller states she is vomiting, sore throat, body aches.    Nurse Assessment  Nurse: Raphael Gibney, RN, Vanita Ingles Date/Time (Eastern Time): 09/19/2015 10:07:27 AM  Confirm and document reason for call. If symptomatic, describe symptoms. ---Caller states she has been vomiting. she was nauseated on Sunday and vomited x 3 on Sunday. No vomiting since Sunday. She is nauseated. Has sore throat since Thursday. She is coughing also. Started coughing on Thursday. Has sinus congestion and headache. Had fever of 101 on Saturday. has had fever off and on and she has to take Advil to bring fever. She is drinking fluids.  Has the patient traveled out of the country within the last 30 days? ---No  Does the patient have any new or worsening symptoms? ---Yes  Will a triage be completed? ---Yes  Related visit to physician within the last 2 weeks? ---No  Does the PT have any chronic conditions? (i.e. diabetes, asthma, etc.) ---Yes  List chronic conditions. ---HTN  Did the patient indicate they were pregnant? ---No  Is this a behavioral health or substance abuse call? ---No     Guidelines    Guideline Title Affirmed Question Affirmed Notes  Sore Throat Fever present > 3 days (72 hours)    Final Disposition User   See Physician within 24 Hours Stringer, RN, Vanita Ingles    Comments  Pt does not want to make appt but agreed to the telehealth option. Sent information to her email address  Advised caller that the email has been sent with the telehealth options and I recommend that she be seen within the next 24 hrs, if no telehealth option is available that works within the next 24 hrs, I still recommend that she be seen within the next 24 hrs. Verbalized understanding.  pt states she sees Dr. [name]   Referrals  REFERRED TO PCP OFFICE   Disagree/Comply: Disagree  Disagree/Comply Reason: Disagree with instructions

## 2015-09-19 NOTE — Telephone Encounter (Signed)
FYI

## 2015-10-27 ENCOUNTER — Telehealth: Payer: Self-pay | Admitting: Family Medicine

## 2015-10-27 MED ORDER — HYDROMORPHONE HCL 4 MG PO TABS: 4.0000 mg | ORAL_TABLET | Freq: Four times a day (QID) | ORAL | Status: DC | PRN

## 2015-10-27 NOTE — Telephone Encounter (Signed)
Pt request refill of the following: HYDROmorphone (DILAUDID) 4 MG tablet   Phamacy: [facility]

## 2015-10-27 NOTE — Telephone Encounter (Signed)
done

## 2015-10-30 NOTE — Telephone Encounter (Signed)
Script is ready for pick up, tried to reach pt and no answer.  

## 2015-11-23 ENCOUNTER — Other Ambulatory Visit: Payer: Self-pay | Admitting: Orthopedic Surgery

## 2015-12-12 ENCOUNTER — Ambulatory Visit (HOSPITAL_COMMUNITY)
Admission: RE | Admit: 2015-12-12 | Discharge: 2015-12-12 | Disposition: A | Discharge status: Home / Self Care | Payer: 59 | Source: Ambulatory Visit | Attending: Orthopedic Surgery | Admitting: Orthopedic Surgery

## 2015-12-12 ENCOUNTER — Encounter (HOSPITAL_COMMUNITY): Payer: Self-pay

## 2015-12-12 ENCOUNTER — Encounter (HOSPITAL_COMMUNITY)
Admission: RE | Admit: 2015-12-12 | Discharge: 2015-12-12 | Disposition: A | Discharge status: Home / Self Care | Payer: 59 | Source: Ambulatory Visit | Attending: Orthopedic Surgery | Admitting: Orthopedic Surgery

## 2015-12-12 DIAGNOSIS — Z01812 Encounter for preprocedural laboratory examination: Secondary | ICD-10-CM | POA: Insufficient documentation

## 2015-12-12 DIAGNOSIS — Z01810 Encounter for preprocedural cardiovascular examination: Secondary | ICD-10-CM | POA: Insufficient documentation

## 2015-12-12 DIAGNOSIS — Z01818 Encounter for other preprocedural examination: Secondary | ICD-10-CM

## 2015-12-12 HISTORY — DX: Adverse effect of unspecified anesthetic, initial encounter: T41.45XA

## 2015-12-12 LAB — CBC WITH DIFFERENTIAL/PLATELET
BASOS ABS: 0 10*3/uL (ref 0.0–0.1)
BASOS PCT: 0 %
EOS ABS: 0.1 10*3/uL (ref 0.0–0.7)
Eosinophils Relative: 1 %
HEMATOCRIT: 41.2 % (ref 36.0–46.0)
HEMOGLOBIN: 13.6 g/dL (ref 12.0–15.0)
Lymphocytes Relative: 23 %
Lymphs Abs: 2.2 10*3/uL (ref 0.7–4.0)
MCH: 30 pg (ref 26.0–34.0)
MCHC: 33 g/dL (ref 30.0–36.0)
MCV: 90.7 fL (ref 78.0–100.0)
Monocytes Absolute: 0.9 10*3/uL (ref 0.1–1.0)
Monocytes Relative: 9 %
NEUTROS ABS: 6.4 10*3/uL (ref 1.7–7.7)
NEUTROS PCT: 67 %
Platelets: 276 10*3/uL (ref 150–400)
RBC: 4.54 MIL/uL (ref 3.87–5.11)
RDW: 14.4 % (ref 11.5–15.5)
WBC: 9.6 10*3/uL (ref 4.0–10.5)

## 2015-12-12 LAB — URINE MICROSCOPIC-ADD ON: RBC / HPF: NONE SEEN RBC/hpf (ref 0–5)

## 2015-12-12 LAB — URINALYSIS, ROUTINE W REFLEX MICROSCOPIC
BILIRUBIN URINE: NEGATIVE
Glucose, UA: NEGATIVE mg/dL
HGB URINE DIPSTICK: NEGATIVE
Ketones, ur: 15 mg/dL — AB
NITRITE: POSITIVE — AB
PH: 7 (ref 5.0–8.0)
Protein, ur: NEGATIVE mg/dL
SPECIFIC GRAVITY, URINE: 1.021 (ref 1.005–1.030)

## 2015-12-12 LAB — COMPREHENSIVE METABOLIC PANEL
ALBUMIN: 3.8 g/dL (ref 3.5–5.0)
ALK PHOS: 80 U/L (ref 38–126)
ALT: 25 U/L (ref 14–54)
AST: 26 U/L (ref 15–41)
Anion gap: 10 (ref 5–15)
BILIRUBIN TOTAL: 0.9 mg/dL (ref 0.3–1.2)
BUN: 9 mg/dL (ref 6–20)
CALCIUM: 9.2 mg/dL (ref 8.9–10.3)
CO2: 25 mmol/L (ref 22–32)
Chloride: 104 mmol/L (ref 101–111)
Creatinine, Ser: 0.94 mg/dL (ref 0.44–1.00)
GFR calc Af Amer: >60 mL/min (ref >60)
GFR calc non Af Amer: >60 mL/min (ref >60)
GLUCOSE: 99 mg/dL (ref 65–99)
Potassium: 4.6 mmol/L (ref 3.5–5.1)
SODIUM: 139 mmol/L (ref 135–145)
TOTAL PROTEIN: 7.3 g/dL (ref 6.5–8.1)

## 2015-12-12 LAB — SURGICAL PCR SCREEN
MRSA, PCR: NEGATIVE
Staphylococcus aureus: NEGATIVE

## 2015-12-12 LAB — PROTIME-INR
INR: 0.96 (ref 0.00–1.49)
Prothrombin Time: 13 seconds (ref 11.6–15.2)

## 2015-12-12 LAB — APTT: APTT: 24 s (ref 24–37)

## 2015-12-12 NOTE — Pre-Procedure Instructions (Signed)
    Dana [name]  12/12/2015    Your procedure is scheduled on Wednesday, April 5.  Report to [facility] Admitting at 6:30 A.M.                 Your surgery or procedure is scheduled for 8:30 AM   Call this number if you have problems the morning of [phone]                For any other questions, please call [phone], Monday - Friday 8 AM - 4 PM.  Remember:  Do not eat food or drink liquids after midnight Tuesday,April 4.   Take these medicines the morning of surgery with A SIP OF WATER :amLODipine (NORVASC).               May take HYDROmorphone (DILAUDID),  ALPRAZolam [name]) if  Needed and you tolerate on an empty stomach.   Do not wear jewelry, make-up or nail polish.  Do not wear lotions, powders, or perfumes.  Do not shave 48 hours prior to surgery.    Do not bring valuables to the hospital.  [facility] is not responsible for any belongings or valuables.  Contacts, dentures or bridgework may not be worn into surgery.  Leave your suitcase in the car.  After surgery it may be brought to your room  Special instructions:  Review  [place] - Preparing For Surgery.  Please read over the following fact sheets that you were given: Pain Booklet, Coughing and Deep Breathing, MRSA Information and Surgical Site Infection Prevention

## 2015-12-19 MED ORDER — VANCOMYCIN HCL 10 G IV SOLR
1500.0000 mg | INTRAVENOUS | Status: AC
  Administered 2015-12-20: 1500 mg via INTRAVENOUS
  Filled 2015-12-19: qty 1500

## 2015-12-20 ENCOUNTER — Encounter (HOSPITAL_COMMUNITY)
Admission: RE | Disposition: A | Discharge status: Home / Self Care | Payer: Self-pay | Source: Ambulatory Visit | Attending: Orthopedic Surgery

## 2015-12-20 ENCOUNTER — Ambulatory Visit (HOSPITAL_COMMUNITY): Payer: Worker's Compensation

## 2015-12-20 ENCOUNTER — Observation Stay (HOSPITAL_COMMUNITY)
Admission: RE | Admit: 2015-12-20 | Discharge: 2015-12-21 | Disposition: A | Discharge status: Home / Self Care | Payer: Worker's Compensation | Source: Ambulatory Visit | Attending: Orthopedic Surgery | Admitting: Orthopedic Surgery

## 2015-12-20 ENCOUNTER — Encounter (HOSPITAL_COMMUNITY): Payer: Self-pay | Admitting: *Deleted

## 2015-12-20 ENCOUNTER — Ambulatory Visit (HOSPITAL_COMMUNITY): Payer: Worker's Compensation | Admitting: Anesthesiology

## 2015-12-20 DIAGNOSIS — I10 Essential (primary) hypertension: Secondary | ICD-10-CM | POA: Diagnosis not present

## 2015-12-20 DIAGNOSIS — M4802 Spinal stenosis, cervical region: Secondary | ICD-10-CM | POA: Diagnosis not present

## 2015-12-20 DIAGNOSIS — F419 Anxiety disorder, unspecified: Secondary | ICD-10-CM | POA: Insufficient documentation

## 2015-12-20 DIAGNOSIS — E7800 Pure hypercholesterolemia, unspecified: Secondary | ICD-10-CM | POA: Diagnosis not present

## 2015-12-20 DIAGNOSIS — Z87891 Personal history of nicotine dependence: Secondary | ICD-10-CM | POA: Diagnosis not present

## 2015-12-20 DIAGNOSIS — M5410 Radiculopathy, site unspecified: Secondary | ICD-10-CM | POA: Diagnosis present

## 2015-12-20 DIAGNOSIS — Z880 Allergy status to penicillin: Secondary | ICD-10-CM | POA: Diagnosis not present

## 2015-12-20 DIAGNOSIS — Z419 Encounter for procedure for purposes other than remedying health state, unspecified: Secondary | ICD-10-CM

## 2015-12-20 DIAGNOSIS — M50123 Cervical disc disorder at C6-C7 level with radiculopathy: Principal | ICD-10-CM | POA: Insufficient documentation

## 2015-12-20 HISTORY — PX: ANTERIOR CERVICAL DECOMP/DISCECTOMY FUSION: SHX1161

## 2015-12-20 SURGERY — ANTERIOR CERVICAL DECOMPRESSION/DISCECTOMY FUSION 3 LEVELS
Anesthesia: General

## 2015-12-20 MED ORDER — SODIUM CHLORIDE 0.9 % IV SOLN
10.0000 mg | INTRAVENOUS | Status: DC | PRN
  Administered 2015-12-20: 10 ug/min via INTRAVENOUS

## 2015-12-20 MED ORDER — DEXAMETHASONE SODIUM PHOSPHATE 10 MG/ML IJ SOLN
INTRAMUSCULAR | Status: DC | PRN
  Administered 2015-12-20: 10 mg via INTRAVENOUS

## 2015-12-20 MED ORDER — SODIUM CHLORIDE 0.9 % IV SOLN: 250.0000 mL | INTRAVENOUS | Status: DC

## 2015-12-20 MED ORDER — HYDROMORPHONE HCL 1 MG/ML IJ SOLN
INTRAMUSCULAR | Status: AC
Start: 2015-12-20 — End: 2015-12-20
  Administered 2015-12-20: 0.5 mg via INTRAVENOUS
  Filled 2015-12-20: qty 1

## 2015-12-20 MED ORDER — DOCUSATE SODIUM 100 MG PO CAPS
100.0000 mg | ORAL_CAPSULE | Freq: Two times a day (BID) | ORAL | Status: DC
  Administered 2015-12-20 (×2): 100 mg via ORAL
  Filled 2015-12-20 (×2): qty 1

## 2015-12-20 MED ORDER — VANCOMYCIN HCL IN DEXTROSE 1-5 GM/200ML-% IV SOLN
1000.0000 mg | Freq: Once | INTRAVENOUS | Status: AC
  Administered 2015-12-20: 1000 mg via INTRAVENOUS
  Filled 2015-12-20: qty 200

## 2015-12-20 MED ORDER — HYDROMORPHONE HCL 1 MG/ML IJ SOLN
0.2500 mg | INTRAMUSCULAR | Status: DC | PRN
  Administered 2015-12-20 (×2): 0.5 mg via INTRAVENOUS

## 2015-12-20 MED ORDER — ACETAMINOPHEN 325 MG PO TABS
650.0000 mg | ORAL_TABLET | ORAL | Status: DC | PRN
  Administered 2015-12-20: 650 mg via ORAL
  Filled 2015-12-20: qty 2

## 2015-12-20 MED ORDER — ARTIFICIAL TEARS OP OINT
TOPICAL_OINTMENT | OPHTHALMIC | Status: DC | PRN
  Administered 2015-12-20: 1 via OPHTHALMIC

## 2015-12-20 MED ORDER — THROMBIN 20000 UNITS EX SOLR
CUTANEOUS | Status: AC
  Filled 2015-12-20: qty 20000

## 2015-12-20 MED ORDER — MIDAZOLAM HCL 2 MG/2ML IJ SOLN
INTRAMUSCULAR | Status: AC
  Filled 2015-12-20: qty 2

## 2015-12-20 MED ORDER — ACETAMINOPHEN 650 MG RE SUPP: 650.0000 mg | RECTAL | Status: DC | PRN

## 2015-12-20 MED ORDER — ARTIFICIAL TEARS OP OINT
TOPICAL_OINTMENT | OPHTHALMIC | Status: AC
  Filled 2015-12-20: qty 7

## 2015-12-20 MED ORDER — ALBUTEROL SULFATE HFA 108 (90 BASE) MCG/ACT IN AERS
INHALATION_SPRAY | RESPIRATORY_TRACT | Status: AC
  Filled 2015-12-20: qty 6.7

## 2015-12-20 MED ORDER — BUPIVACAINE-EPINEPHRINE (PF) 0.25% -1:200000 IJ SOLN
INTRAMUSCULAR | Status: AC
  Filled 2015-12-20: qty 30

## 2015-12-20 MED ORDER — ESMOLOL HCL 100 MG/10ML IV SOLN
INTRAVENOUS | Status: AC
  Filled 2015-12-20: qty 10

## 2015-12-20 MED ORDER — ONDANSETRON HCL 4 MG/2ML IJ SOLN
INTRAMUSCULAR | Status: AC
  Filled 2015-12-20: qty 2

## 2015-12-20 MED ORDER — THROMBIN 20000 UNITS EX KIT
PACK | CUTANEOUS | Status: DC | PRN
  Administered 2015-12-20: 20000 [IU] via TOPICAL

## 2015-12-20 MED ORDER — ONDANSETRON HCL 4 MG/2ML IJ SOLN: 4.0000 mg | INTRAMUSCULAR | Status: DC | PRN

## 2015-12-20 MED ORDER — DIAZEPAM 5 MG PO TABS
ORAL_TABLET | ORAL | Status: AC
  Administered 2015-12-20: 5 mg via ORAL
  Filled 2015-12-20: qty 1

## 2015-12-20 MED ORDER — PROPOFOL 10 MG/ML IV BOLUS
INTRAVENOUS | Status: AC
  Filled 2015-12-20: qty 20

## 2015-12-20 MED ORDER — ESMOLOL HCL 100 MG/10ML IV SOLN
INTRAVENOUS | Status: DC | PRN
  Administered 2015-12-20: 50 mg via INTRAVENOUS

## 2015-12-20 MED ORDER — MENTHOL 3 MG MT LOZG
1.0000 | LOZENGE | OROMUCOSAL | Status: DC | PRN
  Filled 2015-12-20 (×2): qty 9

## 2015-12-20 MED ORDER — AMLODIPINE BESYLATE 5 MG PO TABS
5.0000 mg | ORAL_TABLET | Freq: Every day | ORAL | Status: DC
  Administered 2015-12-20: 5 mg via ORAL
  Filled 2015-12-20 (×2): qty 1

## 2015-12-20 MED ORDER — ZOLPIDEM TARTRATE 5 MG PO TABS: 5.0000 mg | ORAL_TABLET | Freq: Every evening | ORAL | Status: DC | PRN

## 2015-12-20 MED ORDER — LIDOCAINE HCL (CARDIAC) 20 MG/ML IV SOLN
INTRAVENOUS | Status: DC | PRN
  Administered 2015-12-20: 100 mg via INTRAVENOUS
  Administered 2015-12-20: 50 mg via INTRAVENOUS

## 2015-12-20 MED ORDER — ALPRAZOLAM 1 MG PO TABS: 1.0000 mg | ORAL_TABLET | Freq: Three times a day (TID) | ORAL | Status: DC | PRN

## 2015-12-20 MED ORDER — ONDANSETRON HCL 4 MG/2ML IJ SOLN
INTRAMUSCULAR | Status: DC | PRN
  Administered 2015-12-20: 4 mg via INTRAVENOUS

## 2015-12-20 MED ORDER — LACTATED RINGERS IV SOLN
INTRAVENOUS | Status: DC | PRN
  Administered 2015-12-20 (×3): via INTRAVENOUS

## 2015-12-20 MED ORDER — DEXAMETHASONE SODIUM PHOSPHATE 10 MG/ML IJ SOLN
INTRAMUSCULAR | Status: AC
  Filled 2015-12-20: qty 2

## 2015-12-20 MED ORDER — DIAZEPAM 5 MG PO TABS
5.0000 mg | ORAL_TABLET | Freq: Four times a day (QID) | ORAL | Status: DC | PRN
  Administered 2015-12-20 – 2015-12-21 (×3): 5 mg via ORAL
  Filled 2015-12-20 (×2): qty 1

## 2015-12-20 MED ORDER — PHENOL 1.4 % MT LIQD
1.0000 | OROMUCOSAL | Status: DC | PRN
  Administered 2015-12-20: 1 via OROMUCOSAL
  Filled 2015-12-20: qty 177

## 2015-12-20 MED ORDER — VECURONIUM BROMIDE 10 MG IV SOLR
INTRAVENOUS | Status: DC | PRN
  Administered 2015-12-20: 1 mg via INTRAVENOUS
  Administered 2015-12-20: 3 mg via INTRAVENOUS
  Administered 2015-12-20: 4 mg via INTRAVENOUS

## 2015-12-20 MED ORDER — BISACODYL 5 MG PO TBEC: 5.0000 mg | DELAYED_RELEASE_TABLET | Freq: Every day | ORAL | Status: DC | PRN

## 2015-12-20 MED ORDER — FENTANYL CITRATE (PF) 250 MCG/5ML IJ SOLN
INTRAMUSCULAR | Status: AC
  Filled 2015-12-20: qty 5

## 2015-12-20 MED ORDER — SENNOSIDES-DOCUSATE SODIUM 8.6-50 MG PO TABS: 1.0000 | ORAL_TABLET | Freq: Every evening | ORAL | Status: DC | PRN

## 2015-12-20 MED ORDER — SODIUM CHLORIDE 0.9% FLUSH
3.0000 mL | Freq: Two times a day (BID) | INTRAVENOUS | Status: DC
  Administered 2015-12-20 (×2): 3 mL via INTRAVENOUS

## 2015-12-20 MED ORDER — ROCURONIUM BROMIDE 100 MG/10ML IV SOLN
INTRAVENOUS | Status: DC | PRN
  Administered 2015-12-20: 50 mg via INTRAVENOUS

## 2015-12-20 MED ORDER — LABETALOL HCL 5 MG/ML IV SOLN
INTRAVENOUS | Status: AC
  Filled 2015-12-20: qty 4

## 2015-12-20 MED ORDER — BUPIVACAINE-EPINEPHRINE 0.25% -1:200000 IJ SOLN
INTRAMUSCULAR | Status: DC | PRN
  Administered 2015-12-20: 3 mL

## 2015-12-20 MED ORDER — SODIUM CHLORIDE 0.9% FLUSH: 3.0000 mL | INTRAVENOUS | Status: DC | PRN

## 2015-12-20 MED ORDER — MIDAZOLAM HCL 5 MG/5ML IJ SOLN
INTRAMUSCULAR | Status: DC | PRN
  Administered 2015-12-20: 2 mg via INTRAVENOUS

## 2015-12-20 MED ORDER — LIDOCAINE HCL (CARDIAC) 20 MG/ML IV SOLN
INTRAVENOUS | Status: AC
  Filled 2015-12-20: qty 5

## 2015-12-20 MED ORDER — STERILE WATER FOR INJECTION IJ SOLN
INTRAMUSCULAR | Status: AC
  Filled 2015-12-20: qty 10

## 2015-12-20 MED ORDER — HYDROMORPHONE HCL 1 MG/ML IJ SOLN
0.5000 mg | INTRAMUSCULAR | Status: DC | PRN
  Administered 2015-12-20: 1 mg via INTRAVENOUS
  Filled 2015-12-20: qty 1

## 2015-12-20 MED ORDER — ALUM & MAG HYDROXIDE-SIMETH 200-200-20 MG/5ML PO SUSP: 30.0000 mL | Freq: Four times a day (QID) | ORAL | Status: DC | PRN

## 2015-12-20 MED ORDER — SUGAMMADEX SODIUM 200 MG/2ML IV SOLN
INTRAVENOUS | Status: AC
  Filled 2015-12-20: qty 2

## 2015-12-20 MED ORDER — LIDOCAINE HCL 4 % MT SOLN
OROMUCOSAL | Status: DC | PRN
  Administered 2015-12-20: 4 mL via TOPICAL

## 2015-12-20 MED ORDER — SODIUM CHLORIDE 0.9 % IV SOLN
INTRAVENOUS | Status: DC | PRN
  Administered 2015-12-20: 08:00:00 via INTRAVENOUS

## 2015-12-20 MED ORDER — FENTANYL CITRATE (PF) 250 MCG/5ML IJ SOLN
INTRAMUSCULAR | Status: AC
  Filled 2015-12-20: qty 10

## 2015-12-20 MED ORDER — HYDROMORPHONE HCL 2 MG PO TABS
2.0000 mg | ORAL_TABLET | ORAL | Status: DC | PRN
  Administered 2015-12-20 – 2015-12-21 (×5): 4 mg via ORAL
  Filled 2015-12-20 (×4): qty 2

## 2015-12-20 MED ORDER — EPHEDRINE SULFATE 50 MG/ML IJ SOLN
INTRAMUSCULAR | Status: DC | PRN
  Administered 2015-12-20: 10 mg via INTRAVENOUS

## 2015-12-20 MED ORDER — FENTANYL CITRATE (PF) 100 MCG/2ML IJ SOLN
INTRAMUSCULAR | Status: DC | PRN
  Administered 2015-12-20 (×3): 50 ug via INTRAVENOUS
  Administered 2015-12-20: 150 ug via INTRAVENOUS
  Administered 2015-12-20: 100 ug via INTRAVENOUS
  Administered 2015-12-20 (×3): 50 ug via INTRAVENOUS
  Administered 2015-12-20 (×2): 100 ug via INTRAVENOUS

## 2015-12-20 MED ORDER — POVIDONE-IODINE 7.5 % EX SOLN: Freq: Once | CUTANEOUS | Status: DC

## 2015-12-20 MED ORDER — VECURONIUM BROMIDE 10 MG IV SOLR
INTRAVENOUS | Status: AC
  Filled 2015-12-20: qty 10

## 2015-12-20 MED ORDER — FLEET ENEMA 7-19 GM/118ML RE ENEM: 1.0000 | ENEMA | Freq: Once | RECTAL | Status: DC | PRN

## 2015-12-20 MED ORDER — SUGAMMADEX SODIUM 200 MG/2ML IV SOLN
INTRAVENOUS | Status: DC | PRN
  Administered 2015-12-20: 200 mg via INTRAVENOUS

## 2015-12-20 MED ORDER — HYDROMORPHONE HCL 2 MG PO TABS
ORAL_TABLET | ORAL | Status: AC
Start: 2015-12-20 — End: 2015-12-20
  Administered 2015-12-20: 4 mg via ORAL
  Filled 2015-12-20: qty 2

## 2015-12-20 MED ORDER — PROPOFOL 10 MG/ML IV BOLUS
INTRAVENOUS | Status: DC | PRN
  Administered 2015-12-20: 120 mg via INTRAVENOUS
  Administered 2015-12-20: 30 mg via INTRAVENOUS
  Administered 2015-12-20: 10 mg via INTRAVENOUS

## 2015-12-20 SURGICAL SUPPLY — 80 items
APL SKNCLS STERI-STRIP NONHPOA (GAUZE/BANDAGES/DRESSINGS) ×1
BENZOIN TINCTURE PRP APPL 2/3 (GAUZE/BANDAGES/DRESSINGS) ×3 IMPLANT
BIT DRILL NEURO 2X3.1 SFT TUCH (MISCELLANEOUS) ×1 IMPLANT
BIT DRILL SRG 14X2.2XFLT CHK (BIT) IMPLANT
BIT DRL SRG 14X2.2XFLT CHK (BIT) ×1
BLADE SURG 15 STRL LF DISP TIS (BLADE) ×1 IMPLANT
BLADE SURG 15 STRL SS (BLADE) ×3
BLADE SURG ROTATE 9660 (MISCELLANEOUS) ×3 IMPLANT
CARTRIDGE OIL MAESTRO DRILL (MISCELLANEOUS) ×1 IMPLANT
CLOSURE WOUND 1/2 X4 (GAUZE/BANDAGES/DRESSINGS) ×1
CORDS BIPOLAR (ELECTRODE) ×3 IMPLANT
COVER SURGICAL LIGHT HANDLE (MISCELLANEOUS) ×3 IMPLANT
CRADLE DONUT ADULT HEAD (MISCELLANEOUS) ×3 IMPLANT
DIFFUSER DRILL AIR PNEUMATIC (MISCELLANEOUS) ×3 IMPLANT
DRAIN JACKSON RD 7FR 3/32 (WOUND CARE) IMPLANT
DRAPE C-ARM 42X72 X-RAY (DRAPES) ×3 IMPLANT
DRAPE POUCH INSTRU U-SHP 10X18 (DRAPES) ×3 IMPLANT
DRAPE SURG 17X23 STRL (DRAPES) ×9 IMPLANT
DRILL BIT SKYLINE 14MM (BIT) ×3
DRILL NEURO 2X3.1 SOFT TOUCH (MISCELLANEOUS) ×3
DURAPREP 26ML APPLICATOR (WOUND CARE) ×3 IMPLANT
ELECT COATED BLADE 2.86 ST (ELECTRODE) ×3 IMPLANT
ELECT REM PT RETURN 9FT ADLT (ELECTROSURGICAL) ×3
ELECTRODE REM PT RTRN 9FT ADLT (ELECTROSURGICAL) ×1 IMPLANT
EVACUATOR SILICONE 100CC (DRAIN) IMPLANT
GAUZE SPONGE 4X4 12PLY STRL (GAUZE/BANDAGES/DRESSINGS) ×3 IMPLANT
GAUZE SPONGE 4X4 16PLY XRAY LF (GAUZE/BANDAGES/DRESSINGS) ×3 IMPLANT
GLOVE BIO SURGEON STRL SZ7 (GLOVE) ×5 IMPLANT
GLOVE BIO SURGEON STRL SZ7.5 (GLOVE) ×2 IMPLANT
GLOVE BIO SURGEON STRL SZ8 (GLOVE) ×3 IMPLANT
GLOVE BIOGEL PI IND STRL 6.5 (GLOVE) IMPLANT
GLOVE BIOGEL PI IND STRL 7.0 (GLOVE) ×2 IMPLANT
GLOVE BIOGEL PI IND STRL 8 (GLOVE) ×1 IMPLANT
GLOVE BIOGEL PI INDICATOR 6.5 (GLOVE) ×2
GLOVE BIOGEL PI INDICATOR 7.0 (GLOVE) ×6
GLOVE BIOGEL PI INDICATOR 8 (GLOVE) ×2
GOWN EXTRA PROTECTION XL (GOWNS) ×2 IMPLANT
GOWN STRL REUS W/ TWL LRG LVL3 (GOWN DISPOSABLE) ×1 IMPLANT
GOWN STRL REUS W/ TWL XL LVL3 (GOWN DISPOSABLE) ×1 IMPLANT
GOWN STRL REUS W/TWL LRG LVL3 (GOWN DISPOSABLE) ×6
GOWN STRL REUS W/TWL XL LVL3 (GOWN DISPOSABLE) ×3
INTERLOCK LRDTC CRVCL VBR 6MM (Bone Implant) IMPLANT
INTERLOCK LRDTC CRVCL VBR SM (Bone Implant) IMPLANT
IV CATH 14GX2 1/4 (CATHETERS) ×3 IMPLANT
KIT BASIN OR (CUSTOM PROCEDURE TRAY) ×3 IMPLANT
KIT ROOM TURNOVER OR (KITS) ×3 IMPLANT
LORDOTIC CERVICAL VBR 6MM SM (Bone Implant) ×6 IMPLANT
LORDOTIC CERVICAL VBR SM 5MM (Bone Implant) ×3 IMPLANT
MANIFOLD NEPTUNE II (INSTRUMENTS) ×3 IMPLANT
NDL SPNL 20GX3.5 QUINCKE YW (NEEDLE) ×1 IMPLANT
NEEDLE 27GAX1X1/2 (NEEDLE) ×3 IMPLANT
NEEDLE SPNL 20GX3.5 QUINCKE YW (NEEDLE) ×3 IMPLANT
NS IRRIG 1000ML POUR BTL (IV SOLUTION) ×3 IMPLANT
OIL CARTRIDGE MAESTRO DRILL (MISCELLANEOUS) ×3
PACK ORTHO CERVICAL (CUSTOM PROCEDURE TRAY) ×3 IMPLANT
PAD ARMBOARD 7.5X6 YLW CONV (MISCELLANEOUS) ×6 IMPLANT
PATTIES SURGICAL .5 X.5 (GAUZE/BANDAGES/DRESSINGS) IMPLANT
PATTIES SURGICAL .5 X1 (DISPOSABLE) IMPLANT
PIN DISTRACTION 14 (PIN) ×4 IMPLANT
PLATE SKYLINE 3LVL 45MM CERV (Plate) ×2 IMPLANT
PUTTY BONE DBX 5CC MIX (Putty) ×2 IMPLANT
SCREW SKYLINE VAR OS 14MM (Screw) ×16 IMPLANT
SPONGE INTESTINAL PEANUT (DISPOSABLE) ×3 IMPLANT
SPONGE SURGIFOAM ABS GEL 100 (HEMOSTASIS) IMPLANT
STRIP CLOSURE SKIN 1/2X4 (GAUZE/BANDAGES/DRESSINGS) ×2 IMPLANT
SURGIFLO W/THROMBIN 8M KIT (HEMOSTASIS) IMPLANT
SUT MNCRL AB 4-0 PS2 18 (SUTURE) IMPLANT
SUT SILK 4 0 (SUTURE)
SUT SILK 4-0 18XBRD TIE 12 (SUTURE) IMPLANT
SUT VIC AB 2-0 CT2 18 VCP726D (SUTURE) ×3 IMPLANT
SYR BULB IRRIGATION 50ML (SYRINGE) ×3 IMPLANT
SYR CONTROL 10ML LL (SYRINGE) ×3 IMPLANT
TAPE CLOTH 4X10 WHT NS (GAUZE/BANDAGES/DRESSINGS) ×3 IMPLANT
TAPE CLOTH SURG 4X10 WHT LF (GAUZE/BANDAGES/DRESSINGS) ×2 IMPLANT
TAPE UMBILICAL COTTON 1/8X30 (MISCELLANEOUS) ×6 IMPLANT
TOWEL OR 17X24 6PK STRL BLUE (TOWEL DISPOSABLE) ×3 IMPLANT
TOWEL OR 17X26 10 PK STRL BLUE (TOWEL DISPOSABLE) ×3 IMPLANT
TRAY FOLEY CATH 16FRSI W/METER (SET/KITS/TRAYS/PACK) ×3 IMPLANT
WATER STERILE IRR 1000ML POUR (IV SOLUTION) ×3 IMPLANT
YANKAUER SUCT BULB TIP NO VENT (SUCTIONS) ×3 IMPLANT

## 2015-12-20 NOTE — Anesthesia Procedure Notes (Addendum)
Procedure Name: Intubation Date/Time: 12/20/2015 8:40 AM Performed by: Jacquiline Doe A Pre-anesthesia Checklist: Patient identified, Emergency Drugs available, Suction available, Patient being monitored and Timeout performed Patient Re-evaluated:Patient Re-evaluated prior to inductionOxygen Delivery Method: Circle system utilized Preoxygenation: Pre-oxygenation with 100% oxygen Intubation Type: IV induction and Cricoid Pressure applied Ventilation: Mask ventilation without difficulty Laryngoscope Size: Mac and 4 Grade View: Grade I Tube type: Oral Tube size: 7.0 mm Number of attempts: 1 Airway Equipment and Method: Rigid stylet and LTA kit utilized Placement Confirmation: ETT inserted through vocal cords under direct vision,  positive ETCO2 and breath sounds checked- equal and bilateral Secured at: 22 cm Tube secured with: Tape Dental Injury: Teeth and Oropharynx as per pre-operative assessment

## 2015-12-20 NOTE — Progress Notes (Signed)
Orthopedic Tech Progress Note Patient Details:  Dana [name] 07-12-1966 [id number]  Patient ID: [name], female   DOB: [birth year], 50 y.o.   MRN: [id number] Viewed order from doctor's order list  Hildred Priest 12/20/2015, 2:59 PM

## 2015-12-20 NOTE — Transfer of Care (Signed)
Immediate Anesthesia Transfer of Care Note  [name]  Procedure(s) Performed: Procedure(s) with comments: ANTERIOR CERVICAL DECOMPRESSION FUSION CERVICAL 4-5, CERVICAL 5-6, CERVICAL 6-7 WITH INSTRUMENTATION AND ALLOGRAFT (N/A) - Anterior cervical decompression fusion, cervical 4-5, cervical 5-6, cervical 6-7 with instrumentation and allograft  Patient Location: PACU  Anesthesia Type:General  Level of Consciousness: awake, oriented, sedated, patient cooperative and responds to stimulation  Airway & Oxygen Therapy: Patient Spontanous Breathing and Patient connected to nasal cannula oxygen  Post-op Assessment: Report given to RN, Post -op Vital signs reviewed and stable, Patient moving all extremities and Patient moving all extremities X 4  Post vital signs: Reviewed and stable  Last Vitals:  Filed Vitals:   12/20/15 0704  BP: 148/88  Pulse: 88  Temp: 36.4 C  Resp: 20    Complications: No apparent anesthesia complications

## 2015-12-20 NOTE — Progress Notes (Signed)
Orthopedic Tech Progress Note Patient Details:  Dana [name] Mar 27, 1966 [id number]  Ortho Devices Type of Ortho Device: [place] cervical collar Ortho Device/Splint Location: neck Ortho Device/Splint Interventions: Loanne Drilling, Mikaeel Petrow 12/20/2015, 2:59 PM

## 2015-12-20 NOTE — Op Note (Signed)
Dana [name], Dana [name]               ACCOUNT NO.:  [id number]  MEDICAL RECORD NO.:  [id number]  LOCATION:  [facility]                         FACILITY:  [facility]  PHYSICIAN:  Phylliss Bob, MD      DATE OF BIRTH:  12/10/1965  DATE OF PROCEDURE:  12/20/2015                              OPERATIVE REPORT   PREOPERATIVE DIAGNOSES: 1. Cervical radiculopathy. 2. Cervical degenerative disk disease C4-5, C5-6, C6-7. 3. Varying degrees of neural foraminal stenosis C4-5, C5-6, C6-7.  POSTOPERATIVE DIAGNOSES: 1. Cervical radiculopathy. 2. Cervical degenerative disk disease C4-5, C5-6, C6-7. 3. Varying degrees of neural foraminal stenosis C4-5, C5-6, C6-7.  PROCEDURE: 1. Anterior cervical decompression and fusion C4-5, C5-6, C6-7. 2. Placement of anterior instrumentation, C4-C7. 3. Insertion of interbody device x3 (Titan intervertebral spacers). 4. Use of morselized allograft-DBX mix. 5. Intraoperative use of fluoroscopy.  SURGEON:  Phylliss Bob, MD  ASSISTANT:  Pricilla Holm, PA-C  ANESTHESIA:  General endotracheal anesthesia.  COMPLICATIONS:  None.  DISPOSITION:  Stable.  ESTIMATED BLOOD LOSS:  Minimal.  INDICATIONS FOR SURGERY:  Briefly, Ms. Zittel is a very pleasant 50- year-old female, who did initially present to me on October 02, 2015, with neck pain as well as right arm numbness and weakness.  The patient's pain began on June 01, 2015, when she slipped on a floor and fell onto her back.  She then had pain in her neck as well as tingling and numbness and pain down her arm into her right hand.  We did obtain an MRI, which was notable for varying degrees of neural foraminal stenosis spanning C4-C7.  I did proceed with conservative treatment, but the patient did continue to have ongoing pain.  We therefore did discuss proceeding with the procedure noted above.  The patient did elect to proceed after full understanding of the risks and limitations of surgery.  OPERATIVE  DETAILS:  On December 20, 2015, patient was brought to surgery and general endotracheal anesthesia was administered.  The patient was placed supine on the hospital bed.  The patient's neck was gently extended.  The patient's arms were secured to her sides.  All bony prominences were padded.  The patient's shoulders were taped to the inferior aspect of the bed.  The neck was then prepped and draped in the usual fashion and a left-sided transverse incision was made.  The platysma was incised.  A Smith-Robinson approach was used and the anterior spine was noted.  I then subperiosteally exposed the vertebral bodies from C4-C7.  A self-retaining retractor was placed.  Caspar pins were then placed into the C6 and C7 vertebral bodies and distraction was applied.  I then proceeded with a thorough and complete C6-7 intervertebral diskectomy, decompressing the right and left neural foramen.  I was very pleased with the decompression.  After preparing the endplates, the appropriate size interbody spacer was packed with DBX mix and tamped into position in the usual fashion.  The lower Caspar pin was removed and placed into the C5 vertebral body and distraction was applied across the C5-6 intervertebral space.  Once again, a thorough intervertebral diskectomy was performed.  Posterior osteophytes and a posterior disk protrusion was noted in the spinal  canal and was noted to be extending into the right and left neural foramen.  The neural foramen were then decompressed.  I was very pleased with the decompression. After preparing the endplates, the appropriate size interbody spacer was packed with DBX mix and tamped into position.  The lower Caspar pin was removed and placed into the C4 vertebral body.  Once again, distraction was applied and once again, a thorough central and bilateral neural foraminal decompression was performed.  The endplates were prepared and the appropriate size interbody spacer was  packed with DBX mix and tamped into position in the usual fashion.  I was very pleased with the press- fit of each of the spacers.  The Caspar pins were then removed and bone wax was placed in that place.  The appropriate size anterior cervical plate was placed over the anterior spine.  A 14 mm variable angle screws were placed, 2 in each vertebral body from C4-C7 for a total of 8 vertebral body screws.  I was very pleased with the press-fit of each of the screws.  The screws were then locked to the plate using the CAM locking mechanism.  The wound was then copiously irrigated.  There was no bleeding noted.  The platysma was then closed using 2-0 Vicryl and the skin was closed using 3-0 Monocryl.  Benzoin and Steri-Strips were applied followed by sterile dressing.  All instrument counts were correct at the termination of the procedure.  Of note, Pricilla Holm was my assistant throughout surgery, and did aid in retraction, suctioning, and closure from start to finish.     Phylliss Bob, MD     MD/MEDQ  D:  12/20/2015  T:  12/20/2015  Job:  [id number]  cc:   Ishmael Holter. Sarajane Jews, MD

## 2015-12-20 NOTE — Anesthesia Postprocedure Evaluation (Signed)
Anesthesia Post Note  [name]  Procedure(s) Performed: Procedure(s) (LRB): ANTERIOR CERVICAL DECOMPRESSION FUSION CERVICAL 4-5, CERVICAL 5-6, CERVICAL 6-7 WITH INSTRUMENTATION AND ALLOGRAFT (N/A)  Patient location during evaluation: PACU Anesthesia Type: General Level of consciousness: awake and alert Pain management: pain level controlled Vital Signs Assessment: post-procedure vital signs reviewed and stable Respiratory status: spontaneous breathing, nonlabored ventilation, respiratory function stable and patient connected to nasal cannula oxygen Cardiovascular status: blood pressure returned to baseline and stable Postop Assessment: no signs of nausea or vomiting Anesthetic complications: no    Last Vitals:  Filed Vitals:   12/20/15 1313 12/20/15 1331  BP:  159/73  Pulse:  110  Temp: 37 C 36.4 C  Resp:  16    Last Pain:  Filed Vitals:   12/20/15 1356  PainSc: 6                  Serra Younan,[name] TERRILL

## 2015-12-20 NOTE — Anesthesia Preprocedure Evaluation (Addendum)
Anesthesia Evaluation  Patient identified by MRN, date of birth, ID band Patient awake    Airway Mallampati: II  TM Distance: >3 FB Neck ROM: Full    Dental  (+) Teeth Intact   Pulmonary neg pulmonary ROS, former smoker,    breath sounds clear to auscultation       Cardiovascular hypertension,  Rhythm:Regular Rate:Normal     Neuro/Psych  Headaches,    GI/Hepatic negative GI ROS, Neg liver ROS,   Endo/Other  negative endocrine ROS  Renal/GU negative Renal ROS     Musculoskeletal negative musculoskeletal ROS (+) Arthritis ,   Abdominal   Peds  Hematology negative hematology ROS (+)   Anesthesia Other Findings   Reproductive/Obstetrics                            Anesthesia Physical Anesthesia Plan  ASA: II  Anesthesia Plan: General   Post-op Pain Management:    Induction:   Airway Management Planned: Oral ETT  Additional Equipment:   Intra-op Plan:   Post-operative Plan: Extubation in OR  Informed Consent: I have reviewed the patients History and Physical, chart, labs and discussed the procedure including the risks, benefits and alternatives for the proposed anesthesia with the patient or authorized representative who has indicated his/her understanding and acceptance.   Dental advisory given  Plan Discussed with: CRNA and Surgeon  Anesthesia Plan Comments:         Anesthesia Quick Evaluation

## 2015-12-20 NOTE — H&P (Signed)
PREOPERATIVE H&P  Chief Complaint: R arm pain and weakness  HPI: Dana [name] is a 50 y.o. female who presents with ongoing pain in the right arm and weakness x 7 months  MRI reveals stenosis C4-7  Patient has failed multiple forms of conservative care and continues to have pain (see office notes for additional details regarding the patient's full course of treatment)  Past Medical History  Diagnosis Date  . Tobacco use disorder   . Mild hypertension   . Hypercholesterolemia   . Obesity   . Breast mass, right   . DJD (degenerative joint disease)   . Back pain with radiation   . Headache(784.0)   . Anxiety   . Groin abscess   . IBS (irritable bowel syndrome)   . History of IBS   . Lactose intolerance   . Complication of anesthesia 04/07/14   Past Surgical History  Procedure Laterality Date  . Tubal ligation    . Lumbar disc surgery    . Finger surgery      Right index-excision of mass   . Breast lumpectomy      x2  . Foot surgery Right     Bone Spurs  . Laparoscopic hysterectomy  09/03/2012    Procedure: HYSTERECTOMY TOTAL LAPAROSCOPIC;  Surgeon: [name], MD;  Location: [facility];  Service: Gynecology;  Laterality: N/A;  . Bilateral salpingectomy  09/03/2012    Procedure: BILATERAL SALPINGECTOMY;  Surgeon: [name], MD;  Location: [facility];  Service: Gynecology;  Laterality: Bilateral;  . Esophagogastroduodenoscopy    . Colonoscopy w/ biopsies    . Back surgery    . Disectomy    . Breast biopsy Right 04/07/2014    Procedure: REMOVAL RIGHT BREAST MASS WITH WIRE LOCALIZATION;  Surgeon: [name], MD;  Location: [facility];  Service: General;  Laterality: Right;  . Abdominal hysterectomy     Social History   Social History  . Marital Status: Married    Spouse Name: Ahmoni Durazo  . Number of Children: 4  . Years of Education: N/A   Occupational History  . [name]    Social History Main Topics  . Smoking status: Former  Smoker -- 33 years    Types: Cigarettes  . Smokeless tobacco: Never Used     Comment: quit March 2017  . Alcohol Use: No     Comment: occ  . Drug Use: No  . Sexual Activity: Yes    Birth Control/ Protection: Surgical     Comment: tubal ligation   Other Topics Concern  . None   Social History Narrative   Married - lives with husband 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-law   Works in [place], [place] (twins)   Grandchildren - 4   Updated 10/12/2013         Family History  Problem Relation Age of Onset  . Diabetes Father   . Diabetes Mother   . Prostate cancer Paternal Grandfather   . Prostate cancer Paternal Grandfather   . Breast cancer Maternal Aunt 46   Allergies  Allergen Reactions  . Lisinopril Anaphylaxis    was hospitalized for 3 days  . Hydrocodone Hives    hives  . Penicillins Itching and Swelling    Has patient had a PCN reaction causing immediate rash, facial/tongue/throat swelling, SOB or lightheadedness with hypotension: Yes Has patient had a PCN reaction causing severe rash involving mucus membranes or skin necrosis: No  Has patient had a PCN reaction that required hospitalization No Has patient had a PCN reaction occurring within the last 10 years: No If all of the above answers are "NO", then may proceed with Cephalosporin use.   . Codeine Hives   Prior to Admission medications   Medication Sig Start Date End Date Taking? Authorizing Provider  amLODipine (NORVASC) 5 MG tablet Take 1 tablet (5 mg total) by mouth daily. 04/14/15  Yes [name], MD  cyclobenzaprine (FLEXERIL) 5 MG tablet Take 5 mg by mouth at bedtime. 11/03/15  Yes Historical Provider, MD  HYDROmorphone (DILAUDID) 4 MG tablet Take 1 tablet (4 mg total) by mouth every 6 (six) hours as needed for severe pain. 10/27/15  Yes [name], MD  meloxicam (MOBIC) 15 MG tablet TAKE 1 TABLET BY MOUTH DAILY WITH FOOD.  DO NOT TAKE WITH OTHER NSDAIDS 07/03/15  Yes [name], DO    methocarbamol (ROBAXIN) 500 MG tablet Take 1 tablet (500 mg total) by mouth every 8 (eight) hours as needed for muscle spasms. 06/01/15  Yes [name], DO  triamcinolone cream (KENALOG) 0.1 % Apply 1 application topically daily as needed (for itching).  03/10/15  Yes Historical Provider, MD  ALPRAZolam [name]) 1 MG tablet Take 1 tablet (1 mg total) by mouth 3 (three) times daily as needed for anxiety. 12/27/14   [name], MD     All other systems have been reviewed and were otherwise negative with the exception of those mentioned in the HPI and as above.  Physical Exam: Filed Vitals:   12/20/15 0704  BP: 148/88  Pulse: 88  Temp: 97.5 F (36.4 C)  Resp: 20    General: Alert, no acute distress Cardiovascular: No pedal edema Respiratory: No cyanosis, no use of accessory musculature Skin: No lesions in the area of chief complaint Neurologic: Sensation intact distally Psychiatric: Patient is competent for consent with normal mood and affect Lymphatic: No axillary or cervical lymphadenopathy  MUSCULOSKELETAL: + spurling on the R  Assessment/Plan: Right cervical radiculopathy Plan for Procedure(s): ANTERIOR CERVICAL DECOMPRESSION FUSION CERVICAL 4-5, CERVICAL 5-6, CERVICAL 6-7 WITH INSTRUMENTATION AND ALLOGRAFT   Sinclair Ship, MD 12/20/2015 8:01 AM

## 2015-12-21 ENCOUNTER — Encounter (HOSPITAL_COMMUNITY): Payer: Self-pay | Admitting: Orthopedic Surgery

## 2015-12-21 DIAGNOSIS — M50123 Cervical disc disorder at C6-C7 level with radiculopathy: Secondary | ICD-10-CM | POA: Diagnosis not present

## 2015-12-21 MED ORDER — HYDROMORPHONE HCL 4 MG PO TABS: 4.0000 mg | ORAL_TABLET | Freq: Four times a day (QID) | ORAL | Status: DC | PRN

## 2015-12-21 NOTE — Progress Notes (Signed)
Pt doing well. Pt and husband given D/C instructions with Rx's, verbal understanding was provided. Pt's incision is clean and dry with no sign of infection. Pt's IV was removed prior to D/C. Pt D/C'd home via wheelchair @ [phone] per MD order. Pt is stable @ D/C and has no other needs at this time. Holli Humbles, RN

## 2015-12-21 NOTE — Progress Notes (Signed)
    Patient doing well Patient denies R arm pain Posterior neck discomfort noted  Physical Exam: Filed Vitals:   12/21/15 0000 12/21/15 0445  BP: 161/80 133/71  Pulse: 96 92  Temp: 98 F (36.7 C) 98.2 F (36.8 C)  Resp: 20 18   Patient appears comfortable Neck soft Dressing in place NVI  POD #1 s/p C4-7 ACDF, doing well  - encourage ambulation - Dilaudid for pain, Valium for muscle spasms - likely d/c home later today

## 2015-12-22 MED FILL — Thrombin For Soln 20000 Unit: CUTANEOUS | Qty: 1 | Status: AC

## 2015-12-27 NOTE — Discharge Summary (Signed)
Patient ID: LAKRESHA DIERKING MRN: [id number] DOB/AGE: April 09, 1966 50 y.o.  Admit date: 12/20/2015 Discharge date: 12/21/2015  Admission Diagnoses:  Active Problems:   Radiculopathy   Discharge Diagnoses:  Same  Past Medical History  Diagnosis Date  . Tobacco use disorder   . Mild hypertension   . Hypercholesterolemia   . Obesity   . Breast mass, right   . DJD (degenerative joint disease)   . Back pain with radiation   . Headache(784.0)   . Anxiety   . Groin abscess   . IBS (irritable bowel syndrome)   . History of IBS   . Lactose intolerance   . Complication of anesthesia 04/07/14    Surgeries: Procedure(s): ANTERIOR CERVICAL DECOMPRESSION FUSION CERVICAL 4-5, CERVICAL 5-6, CERVICAL 6-7 WITH INSTRUMENTATION AND ALLOGRAFT on 12/20/2015   Consultants:  None  Discharged Condition: Improved  Hospital Course: Dana [name] is an 50 y.o. female who was admitted 12/20/2015 for operative treatment of radiculopathy. Patient has severe unremitting pain that affects sleep, daily activities, and work/hobbies. After pre-op clearance the patient was taken to the operating room on 12/20/2015 and underwent  Procedure(s): ANTERIOR CERVICAL DECOMPRESSION FUSION CERVICAL 4-5, CERVICAL 5-6, CERVICAL 6-7 WITH INSTRUMENTATION AND ALLOGRAFT.    Patient was given perioperative antibiotics:  Anti-infectives    Start     Dose/Rate Route Frequency Ordered Stop   12/20/15 2000  vancomycin (VANCOCIN) IVPB 1000 mg/200 mL premix     1,000 mg 200 mL/hr over 60 Minutes Intravenous  Once 12/20/15 1340 12/20/15 2045   12/20/15 0730  vancomycin (VANCOCIN) 1,500 mg in sodium chloride 0.9 % 500 mL IVPB     1,500 mg 250 mL/hr over 120 Minutes Intravenous To [facility] 12/19/15 1244 12/20/15 0915       Patient was given sequential compression devices, early ambulation to prevent DVT.  Patient benefited maximally from hospital stay and there were no complications.    Recent vital signs: BP  119/63 mmHg  Pulse 99  Temp(Src) 98.1 F (36.7 C) (Oral)  Resp 18  Wt 119.75 kg (264 lb)  SpO2 99%  LMP 08/17/2012  Discharge Medications:     Medication List    TAKE these medications        ALPRAZolam 1 MG tablet  Commonly known as:  XANAX  Take 1 tablet (1 mg total) by mouth 3 (three) times daily as needed for anxiety.     amLODipine 5 MG tablet  Commonly known as:  NORVASC  Take 1 tablet (5 mg total) by mouth daily.     HYDROmorphone 4 MG tablet  Commonly known as:  DILAUDID  Take 1 tablet (4 mg total) by mouth every 6 (six) hours as needed for severe pain.     triamcinolone cream 0.1 %  Commonly known as:  KENALOG  Apply 1 application topically daily as needed (for itching).        Diagnostic Studies: Dg Chest 2 View  12/12/2015  CLINICAL DATA:  Patient is submitted for cervical fusion. No chest complaints. History of hypertension. EXAM: CHEST  2 VIEW COMPARISON:  June 01, 2015 FINDINGS: The heart size and mediastinal contours are within normal limits. There is no focal infiltrate, pulmonary edema, or pleural effusion. Degenerative joint changes of the spine are noted. IMPRESSION: No active cardiopulmonary disease. Electronically Signed   By: Abelardo Diesel M.D.   On: 12/12/2015 12:58   Dg Cervical Spine 1 View  12/20/2015  CLINICAL DATA:  C4-7 ACDF EXAM: CERVICAL SPINE  1 VIEW FLUOROSCOPY TIME:  12 seconds COMPARISON:  None. FINDINGS: Two intraoperative lateral fluoroscopic spot images during C4-7 ACDF. IMPRESSION: Intraoperative fluoroscopic spot images during C4-7 ACDF. Electronically Signed   By: Julian Hy M.D.   On: 12/20/2015 12:14   Dg C-arm 1-60 Min  12/20/2015  CLINICAL DATA:  C4-7 ACDF EXAM: CERVICAL SPINE 1 VIEW FLUOROSCOPY TIME:  12 seconds COMPARISON:  None. FINDINGS: Two intraoperative lateral fluoroscopic spot images during C4-7 ACDF. IMPRESSION: Intraoperative fluoroscopic spot images during C4-7 ACDF. Electronically Signed   By: Julian Hy M.D.   On: 12/20/2015 12:14    Disposition: 01-Home or Self Care   POD #1 s/p C4-7 ACDF, doing well  -Written scripts for pain signed and in chart -D/C instructions sheet printed and in chart -D/C today  -F/U in office 2 weeks   Signed: Justice Britain 12/27/2015, 9:54 AM

## 2016-01-05 ENCOUNTER — Encounter: Payer: Self-pay | Admitting: Family Medicine

## 2016-01-05 ENCOUNTER — Ambulatory Visit (INDEPENDENT_AMBULATORY_CARE_PROVIDER_SITE_OTHER): Payer: 59 | Admitting: Family Medicine

## 2016-01-05 VITALS — BP 136/84 | HR 90 | Temp 98.2°F | Ht 63.0 in | Wt 283.2 lb

## 2016-01-05 DIAGNOSIS — R600 Localized edema: Secondary | ICD-10-CM | POA: Diagnosis not present

## 2016-01-05 LAB — CBC
HEMATOCRIT: 37.6 % (ref 36.0–46.0)
Hemoglobin: 12.4 g/dL (ref 12.0–15.0)
MCHC: 33 g/dL (ref 30.0–36.0)
MCV: 88.5 fl (ref 78.0–100.0)
Platelets: 277 10*3/uL (ref 150.0–400.0)
RBC: 4.25 Mil/uL (ref 3.87–5.11)
RDW: 14.6 % (ref 11.5–15.5)
WBC: 8.1 10*3/uL (ref 4.0–10.5)

## 2016-01-05 LAB — BASIC METABOLIC PANEL
BUN: 10 mg/dL (ref 6–23)
CALCIUM: 9.4 mg/dL (ref 8.4–10.5)
CHLORIDE: 102 meq/L (ref 96–112)
CO2: 27 meq/L (ref 19–32)
CREATININE: 0.82 mg/dL (ref 0.40–1.20)
GFR: 94.78 mL/min (ref >60.00)
Glucose, Bld: 104 mg/dL — ABNORMAL HIGH (ref 70–99)
Potassium: 4.5 mEq/L (ref 3.5–5.1)
Sodium: 140 mEq/L (ref 135–145)

## 2016-01-05 MED ORDER — FUROSEMIDE 20 MG PO TABS: 20.0000 mg | ORAL_TABLET | Freq: Every day | ORAL | Status: DC

## 2016-01-05 NOTE — Progress Notes (Addendum)
HPI:  Dana [name] is a 50 year old patient of Dr. Barbie Banner, here for an acute visit for bilateral lower extremity swelling. She reports she had neck surgery about 3 weeks ago and that since then she has developed bilateral lower extremity edema. The swelling is causing some pain in both of her ankles and anterior lower legs. She denies chest pain, shortness of breath, dyspnea on exertion, calf pain, wheezing or cough. She has been on pain medications and muscle relaxers for her neck and back pain. She takes Norvasc for blood pressure.   ROS: See pertinent positives and negatives per HPI.  Past Medical History  Diagnosis Date  . Tobacco use disorder   . Mild hypertension   . Hypercholesterolemia   . Obesity   . Breast mass, right   . DJD (degenerative joint disease)   . Back pain with radiation   . Headache(784.0)   . Anxiety   . Groin abscess   . IBS (irritable bowel syndrome)   . History of IBS   . Lactose intolerance   . Complication of anesthesia 04/07/14    Past Surgical History  Procedure Laterality Date  . Tubal ligation    . Lumbar disc surgery    . Finger surgery      Right index-excision of mass   . Breast lumpectomy      x2  . Foot surgery Right     Bone Spurs  . Laparoscopic hysterectomy  09/03/2012    Procedure: HYSTERECTOMY TOTAL LAPAROSCOPIC;  Surgeon: [name], MD;  Location: [facility];  Service: Gynecology;  Laterality: N/A;  . Bilateral salpingectomy  09/03/2012    Procedure: BILATERAL SALPINGECTOMY;  Surgeon: [name], MD;  Location: [facility];  Service: Gynecology;  Laterality: Bilateral;  . Esophagogastroduodenoscopy    . Colonoscopy w/ biopsies    . Back surgery    . Disectomy    . Breast biopsy Right 04/07/2014    Procedure: REMOVAL RIGHT BREAST MASS WITH WIRE LOCALIZATION;  Surgeon: [name], MD;  Location: [facility];  Service: General;  Laterality: Right;  . Abdominal hysterectomy    . Anterior cervical decomp/discectomy fusion N/A  12/20/2015    Procedure: ANTERIOR CERVICAL DECOMPRESSION FUSION CERVICAL 4-5, CERVICAL 5-6, CERVICAL 6-7 WITH INSTRUMENTATION AND ALLOGRAFT;  Surgeon: Phylliss Bob, MD;  Location: [facility];  Service: Orthopedics;  Laterality: N/A;  Anterior cervical decompression fusion, cervical 4-5, cervical 5-6, cervical 6-7 with instrumentation and allograft    Family History  Problem Relation Age of Onset  . Diabetes Father   . Diabetes Mother   . Prostate cancer Paternal Grandfather   . Prostate cancer Paternal Grandfather   . Breast cancer Maternal Aunt 24    Social History   Social History  . Marital Status: Married    Spouse Name: Aneisa Blanche  . Number of Children: 4  . Years of Education: N/A   Occupational History  . [name]    Social History Main Topics  . Smoking status: Former Smoker -- 33 years    Types: Cigarettes  . Smokeless tobacco: Never Used     Comment: quit March 2017  . Alcohol Use: No     Comment: occ  . Drug Use: No  . Sexual Activity: Yes    Birth Control/ Protection: Surgical     Comment: tubal ligation   Other Topics Concern  . None   Social History Narrative   Married - lives with husband 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-law   Works in  textiles   4 grown children - [place] (twins)   Grandchildren - 4   Updated 10/12/2013           Current outpatient prescriptions:  .  ALPRAZolam (XANAX) 1 MG tablet, Take 1 tablet (1 mg total) by mouth 3 (three) times daily as needed for anxiety., Disp: 90 tablet, Rfl: 5 .  amLODipine (NORVASC) 5 MG tablet, Take 1 tablet (5 mg total) by mouth daily., Disp: 90 tablet, Rfl: 3 .  HYDROmorphone (DILAUDID) 4 MG tablet, Take 1 tablet (4 mg total) by mouth every 6 (six) hours as needed for severe pain., Disp: 60 tablet, Rfl: 0 .  triamcinolone cream (KENALOG) 0.1 %, Apply 1 application topically daily as needed (for itching). , Disp: , Rfl: 3 .  furosemide (LASIX) 20 MG tablet, Take 1 tablet (20 mg total) by mouth daily.,  Disp: 10 tablet, Rfl: 0  EXAM:  Filed Vitals:   01/05/16 1401  BP: 136/84  Pulse: 90  Temp: 98.2 F (36.8 C)    Body mass index is 50.18 kg/(m^2).  GENERAL: vitals reviewed and listed above, alert, oriented, appears well hydrated and in no acute distress  HEENT: atraumatic, conjunttiva clear, no obvious abnormalities on inspection of external nose and ears  NECK: no obvious masses on inspection, wearing a neck collar  LUNGS: clear to auscultation bilaterally, no wheezes, rales or rhonchi, good air movement   CV: HRRR, bilateral and equal lower extremity edema to the knees, normal pedal pulses, no discoloration, warmth or pain with palpation over the deep veins  MS: moves all extremities without noticeable abnormality  PSYCH: pleasant and cooperative, no obvious depression or anxiety  ASSESSMENT AND PLAN:  Discussed the following assessment and plan:  Bilateral edema of lower extremity - Plan: Basic Metabolic Panel, CBC (no diff)  -we discussed possible serious and likely etiologies, workup and treatment, treatment risks and return precautions -Likely related to recent surgery and change in activity -Opted to check some basic lab work, hold Norvasc and use Lasix instead, at least in the short-term, elevation and compression -Close follow-up  -Patient advised to return or notify a doctor immediately if symptoms worsen or persist or new concerns arise.  Patient Instructions  Before you leave: -Labs -Schedule follow up with Dr. Sarajane Jews next week  Stop the Norvasc for now.  Start the Lasix 20 mg once daily until you see Dr. Sarajane Jews.  Elevate the legs for 30 minutes twice daily.  Compression socks daily. Remove at night.  Please eat a healthy diet and avoid processed foods and foods high in sodium.  The care sooner if symptoms are worsening or you have new concerns.       Colin Benton R.

## 2016-01-05 NOTE — Patient Instructions (Signed)
Before you leave: -Labs -Schedule follow up with Dr. Sarajane Jews next week  Stop the Norvasc for now.  Start the Lasix 20 mg once daily until you see Dr. Sarajane Jews.  Elevate the legs for 30 minutes twice daily.  Compression socks daily. Remove at night.  Please eat a healthy diet and avoid processed foods and foods high in sodium.  The care sooner if symptoms are worsening or you have new concerns.

## 2016-01-05 NOTE — Progress Notes (Signed)
Pre visit review using our clinic review tool, if applicable. No additional management support is needed unless otherwise documented below in the visit note. 

## 2016-01-08 ENCOUNTER — Encounter: Payer: Self-pay | Admitting: Family Medicine

## 2016-01-08 ENCOUNTER — Ambulatory Visit (INDEPENDENT_AMBULATORY_CARE_PROVIDER_SITE_OTHER): Payer: 59 | Admitting: Family Medicine

## 2016-01-08 VITALS — BP 160/98 | HR 94 | Temp 98.0°F | Wt 261.0 lb

## 2016-01-08 DIAGNOSIS — R600 Localized edema: Secondary | ICD-10-CM

## 2016-01-08 DIAGNOSIS — I10 Essential (primary) hypertension: Secondary | ICD-10-CM

## 2016-01-08 MED ORDER — METOPROLOL SUCCINATE ER 50 MG PO TB24: 50.0000 mg | ORAL_TABLET | Freq: Every day | ORAL | Status: DC

## 2016-01-08 MED ORDER — FUROSEMIDE 40 MG PO TABS: 40.0000 mg | ORAL_TABLET | Freq: Two times a day (BID) | ORAL | Status: DC

## 2016-01-08 MED ORDER — HYDROMORPHONE HCL 4 MG PO TABS: 4.0000 mg | ORAL_TABLET | Freq: Four times a day (QID) | ORAL | Status: DC | PRN

## 2016-01-08 NOTE — Progress Notes (Signed)
Pre visit review using our clinic review tool, if applicable. No additional management support is needed unless otherwise documented below in the visit note. 

## 2016-01-08 NOTE — Progress Notes (Signed)
   Subjective:    Patient ID: Dana [name], female    DOB: 1966/07/14, 50 y.o.   MRN: [id number]  HPI  here to follow up edema and HTN. She had cervical spine surgery 3 and 1/2 weeks ago and this went well. Ever since however she has had swelling in both lower legs and feet. No SOB. She was seen here last week and was started on Lasix 20 mg daily. Her usual Amlodipine was held. Her BP that day was normal, as were a BMET and CBC. Today she feels no better. The swelling is still present and is uncomfortable for her. Now her BP is creeping up as well.    Review of Systems  Constitutional: Negative.   Respiratory: Negative.   Cardiovascular: Positive for leg swelling. Negative for chest pain and palpitations.       Objective:   Physical Exam  Constitutional: She appears well-developed and well-nourished.  Neck: No thyromegaly present.  Cardiovascular: Normal rate, regular rhythm, normal heart sounds and intact distal pulses.   Pulmonary/Chest: Breath sounds normal. No respiratory distress. She has no wheezes. She has no rales.  Musculoskeletal:  2+ edema in both ankles and feet  Lymphadenopathy:    She has no cervical adenopathy.          Assessment & Plan:  For the edema we will increase Lasix to 40 mg bid. We will also stop the Amlodipine permanently and start her on Metoprolol succinate 50 mg daily. Recheck in one week.  [name], MD

## 2016-01-15 ENCOUNTER — Encounter: Payer: Self-pay | Admitting: Family Medicine

## 2016-01-15 ENCOUNTER — Ambulatory Visit (INDEPENDENT_AMBULATORY_CARE_PROVIDER_SITE_OTHER): Payer: 59 | Admitting: Family Medicine

## 2016-01-15 VITALS — BP 148/90 | Temp 98.3°F | Ht 63.0 in | Wt 265.0 lb

## 2016-01-15 DIAGNOSIS — R600 Localized edema: Secondary | ICD-10-CM

## 2016-01-15 DIAGNOSIS — I10 Essential (primary) hypertension: Secondary | ICD-10-CM

## 2016-01-15 DIAGNOSIS — R252 Cramp and spasm: Secondary | ICD-10-CM

## 2016-01-15 MED ORDER — POTASSIUM CHLORIDE ER 10 MEQ PO TBCR: 10.0000 meq | EXTENDED_RELEASE_TABLET | Freq: Two times a day (BID) | ORAL | Status: DC

## 2016-01-15 MED ORDER — FUROSEMIDE 40 MG PO TABS: 40.0000 mg | ORAL_TABLET | Freq: Two times a day (BID) | ORAL | Status: DC

## 2016-01-15 NOTE — Progress Notes (Signed)
   Subjective:    Patient ID: Dana [name], female    DOB: 10/21/1965, 50 y.o.   MRN: [id number]  HPI Here to recheck leg edema and HTN. She has been taking Metoprolol and Lasix 40 mg bid for a week, and she feels much better. Her BP is coming down and the fluid in her legs has been reduced. No SOB. She does feel some mild intermittent muscle cramps however.    Review of Systems  Constitutional: Negative.   Respiratory: Negative.   Cardiovascular: Positive for leg swelling. Negative for chest pain and palpitations.  Neurological: Negative.        Objective:   Physical Exam  Constitutional: She is oriented to person, place, and time. She appears well-developed and well-nourished.  Neck: No thyromegaly present.  Cardiovascular: Normal rate, regular rhythm, normal heart sounds and intact distal pulses.   Pulmonary/Chest: Effort normal and breath sounds normal. No respiratory distress. She has no wheezes. She has no rales.  Musculoskeletal:  1+ edema in both feet  Lymphadenopathy:    She has no cervical adenopathy.  Neurological: She is alert and oriented to person, place, and time.          Assessment & Plan:  The edema has improved and her HTN is controlled. She seems to be having muscle cramps from the Lasix, so we will add a potassium supplement bid.  [name], MD

## 2016-01-15 NOTE — Progress Notes (Signed)
Pre visit review using our clinic review tool, if applicable. No additional management support is needed unless otherwise documented below in the visit note. 

## 2016-01-30 ENCOUNTER — Other Ambulatory Visit: Payer: Self-pay

## 2016-01-30 DIAGNOSIS — Z1231 Encounter for screening mammogram for malignant neoplasm of breast: Secondary | ICD-10-CM

## 2016-02-08 ENCOUNTER — Other Ambulatory Visit: Payer: 59

## 2016-02-08 ENCOUNTER — Telehealth: Payer: Self-pay | Admitting: Family Medicine

## 2016-02-08 NOTE — Telephone Encounter (Signed)
Pt needs new rx hydromorpone

## 2016-02-08 NOTE — Telephone Encounter (Signed)
Dr Sarajane Jews received a workers comp drug screen test. Will call pt to come in for a urine sample

## 2016-02-09 ENCOUNTER — Ambulatory Visit: Payer: Self-pay

## 2016-02-09 MED ORDER — HYDROMORPHONE HCL 4 MG PO TABS: 4.0000 mg | ORAL_TABLET | Freq: Four times a day (QID) | ORAL | Status: DC | PRN

## 2016-02-09 NOTE — Telephone Encounter (Signed)
Script is ready for pick up and I spoke with pt.  

## 2016-02-09 NOTE — Telephone Encounter (Signed)
done

## 2016-03-06 ENCOUNTER — Telehealth: Payer: Self-pay | Admitting: Internal Medicine

## 2016-03-06 NOTE — Telephone Encounter (Signed)
Pt states her stomach is hurting and she cannot keep anything down. Pt having nausea and diarrhea. Discussed with pt that Dr. Carlean Purl is out of town for 2 weeks and we cannot get her in until the end of next week with a PA. Pt instructed to call her PCP or be seen at an urgent care. Pt verbalized understanding.

## 2016-03-08 ENCOUNTER — Telehealth: Payer: Self-pay | Admitting: Family Medicine

## 2016-03-08 ENCOUNTER — Ambulatory Visit (INDEPENDENT_AMBULATORY_CARE_PROVIDER_SITE_OTHER): Payer: 59 | Admitting: Family Medicine

## 2016-03-08 ENCOUNTER — Encounter: Payer: Self-pay | Admitting: Family Medicine

## 2016-03-08 VITALS — BP 158/105 | Temp 98.2°F | Wt 276.0 lb

## 2016-03-08 DIAGNOSIS — K529 Noninfective gastroenteritis and colitis, unspecified: Secondary | ICD-10-CM

## 2016-03-08 MED ORDER — ONDANSETRON HCL 8 MG PO TABS: 8.0000 mg | ORAL_TABLET | Freq: Three times a day (TID) | ORAL | Status: DC | PRN

## 2016-03-08 MED ORDER — HYDROMORPHONE HCL 4 MG PO TABS: 4.0000 mg | ORAL_TABLET | Freq: Four times a day (QID) | ORAL | Status: DC | PRN

## 2016-03-08 NOTE — Progress Notes (Signed)
Pre visit review using our clinic review tool, if applicable. No additional management support is needed unless otherwise documented below in the visit note. 

## 2016-03-08 NOTE — Telephone Encounter (Signed)
Per Dr. Sarajane Jews, okay to call and give information, we received a workers compensation drug testing kit her at our office today. We do not perform that type of testing here in the office. I called pt and told her this and that the kit if up front for pick up, also left a voice message with this information for attorney.

## 2016-03-08 NOTE — Progress Notes (Signed)
   Subjective:    Patient ID: Dana [name], female    DOB: 08/30/1966, 50 y.o.   MRN: [id number]  HPI Here for 4 days of upper abdominal cramps, diarrhea, and nausea without vomiting. No fever. She has had little food intake but is drinking fluids.   Review of Systems  Constitutional: Negative.   Respiratory: Negative.   Cardiovascular: Negative.   Gastrointestinal: Positive for nausea, abdominal pain and diarrhea. Negative for vomiting, constipation, blood in stool, abdominal distention, anal bleeding and rectal pain.  Genitourinary: Negative.        Objective:   Physical Exam  Constitutional: She appears well-developed and well-nourished.  Cardiovascular: Normal rate, regular rhythm, normal heart sounds and intact distal pulses.   Pulmonary/Chest: Effort normal and breath sounds normal.  Abdominal: Soft. Bowel sounds are normal. She exhibits no distension and no mass. There is no rebound and no guarding.  Mildly tender in the epigastrium          Assessment & Plan:  Enteritis, this should resolve soon. Drink fluids, eat a BRAT diet. Use Imodium AD and Zofran prn. Recheck the BP at her cpx soon.  [name], MD

## 2016-03-08 NOTE — Telephone Encounter (Signed)
Pt would like for you to call her attorney to let her know what is going on about her needing a drug test.    [place] 336 [phone].

## 2016-03-25 ENCOUNTER — Encounter: Payer: Self-pay | Admitting: Family Medicine

## 2016-03-25 ENCOUNTER — Ambulatory Visit (INDEPENDENT_AMBULATORY_CARE_PROVIDER_SITE_OTHER): Payer: 59 | Admitting: Family Medicine

## 2016-03-25 VITALS — BP 130/90 | HR 92 | Temp 98.1°F | Ht 63.0 in | Wt 270.0 lb

## 2016-03-25 DIAGNOSIS — J069 Acute upper respiratory infection, unspecified: Secondary | ICD-10-CM

## 2016-03-25 DIAGNOSIS — J029 Acute pharyngitis, unspecified: Secondary | ICD-10-CM

## 2016-03-25 LAB — POCT RAPID STREP A (OFFICE): RAPID STREP A SCREEN: NEGATIVE

## 2016-03-25 NOTE — Addendum Note (Signed)
Addended by: Jerl Santos R on: 03/25/2016 02:21 PM   Modules accepted: Orders

## 2016-03-25 NOTE — Patient Instructions (Signed)
Supportive measures of increased fluid, rest, and warm salt water gargles for sore throat discomfort. Follow directions from your surgeon regarding instructions provided to you prior to surgery.  Upper Respiratory Infection, Adult Most upper respiratory infections (URIs) are a viral infection of the air passages leading to the lungs. A URI affects the nose, throat, and upper air passages. The most common type of URI is nasopharyngitis and is typically referred to as "the common cold." URIs run their course and usually go away on their own. Most of the time, a URI does not require medical attention, but sometimes a bacterial infection in the upper airways can follow a viral infection. This is called a secondary infection. Sinus and middle ear infections are common types of secondary upper respiratory infections. Bacterial pneumonia can also complicate a URI. A URI can worsen asthma and chronic obstructive pulmonary disease (COPD). Sometimes, these complications can require emergency medical care and may be life threatening.  CAUSES Almost all URIs are caused by viruses. A virus is a type of germ and can spread from one person to another.  RISKS FACTORS You may be at risk for a URI if:   You smoke.   You have chronic heart or lung disease.  You have a weakened defense (immune) system.   You are very young or very old.   You have nasal allergies or asthma.  You work in crowded or poorly ventilated areas.  You work in health care facilities or schools. SIGNS AND SYMPTOMS  Symptoms typically develop 2-3 days after you come in contact with a cold virus. Most viral URIs last 7-10 days. However, viral URIs from the influenza virus (flu virus) can last 14-18 days and are typically more severe. Symptoms may include:   Runny or stuffy (congested) nose.   Sneezing.   Cough.   Sore throat.   Headache.   Fatigue.   Fever.   Loss of appetite.   Pain in your forehead, behind  your eyes, and over your cheekbones (sinus pain).  Muscle aches.  DIAGNOSIS  Your health care provider may diagnose a URI by:  Physical exam.  Tests to check that your symptoms are not due to another condition such as:  Strep throat.  Sinusitis.  Pneumonia.  Asthma. TREATMENT  A URI goes away on its own with time. It cannot be cured with medicines, but medicines may be prescribed or recommended to relieve symptoms. Medicines may help:  Reduce your fever.  Reduce your cough.  Relieve nasal congestion. HOME CARE INSTRUCTIONS   Take medicines only as directed by your health care provider.   Gargle warm saltwater or take cough drops to comfort your throat as directed by your health care provider.  Use a warm mist humidifier or inhale steam from a shower to increase air moisture. This may make it easier to breathe.  Drink enough fluid to keep your urine clear or pale yellow.   Eat soups and other clear broths and maintain good nutrition.   Rest as needed.   Return to work when your temperature has returned to normal or as your health care provider advises. You may need to stay home longer to avoid infecting others. You can also use a face mask and careful hand washing to prevent spread of the virus.  Increase the usage of your inhaler if you have asthma.   Do not use any tobacco products, including cigarettes, chewing tobacco, or electronic cigarettes. If you need help quitting, ask your health care  provider. PREVENTION  The best way to protect yourself from getting a cold is to practice good hygiene.   Avoid oral or hand contact with people with cold symptoms.   Wash your hands often if contact occurs.  There is no clear evidence that vitamin C, vitamin E, echinacea, or exercise reduces the chance of developing a cold. However, it is always recommended to get plenty of rest, exercise, and practice good nutrition.  SEEK MEDICAL CARE IF:   You are getting worse  rather than better.   Your symptoms are not controlled by medicine.   You have chills.  You have worsening shortness of breath.  You have brown or red mucus.  You have yellow or brown nasal discharge.  You have pain in your face, especially when you bend forward.  You have a fever.  You have swollen neck glands.  You have pain while swallowing.  You have white areas in the back of your throat. SEEK IMMEDIATE MEDICAL CARE IF:   You have severe or persistent:  Headache.  Ear pain.  Sinus pain.  Chest pain.  You have chronic lung disease and any of the following:  Wheezing.  Prolonged cough.  Coughing up blood.  A change in your usual mucus.  You have a stiff neck.  You have changes in your:  Vision.  Hearing.  Thinking.  Mood. MAKE SURE YOU:   Understand these instructions.  Will watch your condition.  Will get help right away if you are not doing well or get worse.   This information is not intended to replace advice given to you by your health care provider. Make sure you discuss any questions you have with your health care provider.   Document Released: 02/26/2001 Document Revised: 01/17/2015 Document Reviewed: 12/08/2013 Elsevier Interactive Patient Education [employer].

## 2016-03-25 NOTE — Progress Notes (Signed)
Subjective:    Patient ID: Dana [name], female    DOB: January 08, 1966, 50 y.o.   MRN: [id number]  HPI  [name] is a 50 year old female who presents today with sore throat for one day.  Associated symptoms of rhinitis, congestion, chills, and myalgias occurred with sore throat. Today, congestion, sore throat, right ear discomfort and rhinitis are present today. Sore throat symptom remains the same as yesterday. Occasional cough from post nasal drip is also reported.  Denies N/V, GERD, chest pain, palpitations, and SOB.  Treatment at home includes chloraseptic spray which has provided benefit.  Recent sick contact exposure of a 12 year old grandchild who also had a sore throat and an 66 month old grandchild who was experiencing a cough. No recent antibiotic therapy. No history of asthma/bronchitis She is scheduled for rotator cuff surgery tomorrow.   Review of Systems  Constitutional: Positive for chills. Negative for fever.  HENT: Positive for congestion, ear pain, postnasal drip, rhinorrhea and sore throat. Negative for sinus pressure.   Respiratory: Positive for cough. Negative for shortness of breath.   Cardiovascular: Negative for chest pain and palpitations.  Gastrointestinal: Negative for nausea, vomiting, abdominal pain, constipation and blood in stool.       Some loose stools and patient has a history of IBS  Skin: Negative for rash.  Neurological: Negative for dizziness, light-headedness and headaches.   Past Medical History  Diagnosis Date  . Tobacco use disorder   . Mild hypertension   . Hypercholesterolemia   . Obesity   . Breast mass, right   . DJD (degenerative joint disease)   . Back pain with radiation   . Headache(784.0)   . Anxiety   . Groin abscess   . IBS (irritable bowel syndrome)   . History of IBS   . Lactose intolerance   . Complication of anesthesia 04/07/14     Social History   Social History  . Marital Status: Married    Spouse Name: Marielena Marconi  . Number of Children: 4  . Years of Education: N/A   Occupational History  . [name]    Social History Main Topics  . Smoking status: Former Smoker -- 33 years    Types: Cigarettes  . Smokeless tobacco: Never Used     Comment: quit March 2017  . Alcohol Use: No     Comment: occ  . Drug Use: No  . Sexual Activity: Yes    Birth Control/ Protection: Surgical     Comment: tubal ligation   Other Topics Concern  . Not on file   Social History Narrative   Married - lives with husband 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-law   Works in [place], [place], [place] (twins)   Grandchildren - 4   Updated 10/12/2013          Past Surgical History  Procedure Laterality Date  . Tubal ligation    . Lumbar disc surgery    . Finger surgery      Right index-excision of mass   . Breast lumpectomy      x2  . Foot surgery Right     Bone Spurs  . Laparoscopic hysterectomy  09/03/2012    Procedure: HYSTERECTOMY TOTAL LAPAROSCOPIC;  Surgeon: [name], MD;  Location: [facility];  Service: Gynecology;  Laterality: N/A;  . Bilateral salpingectomy  09/03/2012    Procedure: BILATERAL SALPINGECTOMY;  Surgeon: [name], MD;  Location: [facility];  Service: Gynecology;  Laterality: Bilateral;  . Esophagogastroduodenoscopy    . Colonoscopy w/ biopsies    . Back surgery    . Disectomy    . Breast biopsy Right 04/07/2014    Procedure: REMOVAL RIGHT BREAST MASS WITH WIRE LOCALIZATION;  Surgeon: [name], MD;  Location: [facility];  Service: General;  Laterality: Right;  . Abdominal hysterectomy    . Anterior cervical decomp/discectomy fusion N/A 12/20/2015    Procedure: ANTERIOR CERVICAL DECOMPRESSION FUSION CERVICAL 4-5, CERVICAL 5-6, CERVICAL 6-7 WITH INSTRUMENTATION AND ALLOGRAFT;  Surgeon: Phylliss Bob, MD;  Location: [facility];  Service: Orthopedics;  Laterality: N/A;  Anterior cervical decompression fusion, cervical 4-5, cervical 5-6, cervical 6-7 with instrumentation and  allograft    Family History  Problem Relation Age of Onset  . Diabetes Father   . Diabetes Mother   . Prostate cancer Paternal Grandfather   . Prostate cancer Paternal Grandfather   . Breast cancer Maternal Aunt 46    Allergies  Allergen Reactions  . Lisinopril Anaphylaxis    was hospitalized for 3 days  . Hydrocodone Hives    hives  . Penicillins Itching and Swelling    Has patient had a PCN reaction causing immediate rash, facial/tongue/throat swelling, SOB or lightheadedness with hypotension: Yes Has patient had a PCN reaction causing severe rash involving mucus membranes or skin necrosis: No Has patient had a PCN reaction that required hospitalization No Has patient had a PCN reaction occurring within the last 10 years: No If all of the above answers are "NO", then may proceed with Cephalosporin use.   . Codeine Hives    Current Outpatient Prescriptions on File Prior to Visit  Medication Sig Dispense Refill  . ALPRAZolam (XANAX) 1 MG tablet Take 1 tablet (1 mg total) by mouth 3 (three) times daily as needed for anxiety. 90 tablet 5  . furosemide (LASIX) 40 MG tablet Take 1 tablet (40 mg total) by mouth 2 (two) times daily. 60 tablet 11  . HYDROmorphone (DILAUDID) 4 MG tablet Take 1 tablet (4 mg total) by mouth every 6 (six) hours as needed for severe pain. 120 tablet 0  . metoprolol succinate (TOPROL-XL) 50 MG 24 hr tablet Take 1 tablet (50 mg total) by mouth daily. Take with or immediately following a meal. 30 tablet 3  . ondansetron (ZOFRAN) 8 MG tablet Take 1 tablet (8 mg total) by mouth every 8 (eight) hours as needed for nausea or vomiting. 30 tablet 0  . potassium chloride (KLOR-CON 10) 10 MEQ tablet Take 1 tablet (10 mEq total) by mouth 2 (two) times daily. 60 tablet 11  . triamcinolone cream (KENALOG) 0.1 % Apply 1 application topically daily as needed (for itching).   3   No current facility-administered medications on file prior to visit.    BP 130/90 mmHg   Pulse 112  Temp(Src) 98.1 F (36.7 C) (Oral)  Ht 5\' 3"  (1.6 m)  Wt 270 lb (122.471 kg)  BMI 47.84 kg/m2  SpO2 97%  LMP 08/17/2012     Objective:   Physical Exam  Constitutional: She is oriented to person, place, and time. She appears well-developed and well-nourished.  HENT:  Left Ear: Tympanic membrane normal.  Nose: Rhinorrhea present. Right sinus exhibits no maxillary sinus tenderness and no frontal sinus tenderness. Left sinus exhibits no maxillary sinus tenderness and no frontal sinus tenderness.  Mouth/Throat: Mucous membranes are normal. Posterior oropharyngeal erythema present. No oropharyngeal exudate.  Erythematous nasal membranes noted. Watery eyes bilaterally. Right  TM dull  Eyes: Pupils are equal, round, and reactive to light. No scleral icterus.  Neck: Neck supple.  Cardiovascular: Normal rate and regular rhythm.   Pulmonary/Chest: Effort normal and breath sounds normal. She has no wheezes. She has no rales.  Lymphadenopathy:    She has no cervical adenopathy.  Neurological: She is alert and oriented to person, place, and time.  Skin: Skin is warm and dry. No rash noted.          Assessment & Plan:  1. Acute upper respiratory infection Check for strep due to upcoming surgery tomorrow. With exam and history, it is unlikely that she has a strep infection as it is likely viral in origin due to no fever, no cervical lymphadenopathy, and no associated respiratory symptoms that would be concerning. Advised patient on supportive measures:  Get rest, drink plenty of fluids, and use warm salt water gargles for sore throat discomfort.  Further advised patient to follow instructions provided to her from her surgeon and report findings of suspected viral upper respiratory infection  to surgeon's office.  In office Strep is negative  Delano Metz, FNP-C

## 2016-04-03 ENCOUNTER — Telehealth: Payer: Self-pay | Admitting: Family Medicine

## 2016-04-03 NOTE — Telephone Encounter (Signed)
Patient requesting antibiotics. Please read details and advise.

## 2016-04-03 NOTE — Telephone Encounter (Signed)
[facility] - Client [place] [facility]  Patient Name: Dana [name]  DOB: 1965-11-15    Initial Comment Pt is still having sore throat   Nurse Assessment  Nurse: Wayne Sever, RN, Tillie Rung Date/Time (Eastern Time): 04/03/2016 3:06:58 PM  Confirm and document reason for call. If symptomatic, describe symptoms. You must click the next button to save text entered. ---Caller states she was seen last Monday for a sore throat, runny nose and sore throat. She states she had shoulder surgery last Tuesday. Caller does not have thermometer to take temperature, but states she feels warm.  Has the patient traveled out of the country within the last 30 days? ---Not Applicable  Does the patient have any new or worsening symptoms? ---Yes  Will a triage be completed? ---Yes  Related visit to physician within the last 2 weeks? ---Yes  Does the PT have any chronic conditions? (i.e. diabetes, asthma, etc.) ---Yes  List chronic conditions. ---HTN,  Is the patient pregnant or possibly pregnant? (Ask all females between the ages of 31-55) ---No  Is this a behavioral health or substance abuse call? ---No     Guidelines    Guideline Title Affirmed Question Affirmed Notes  Sinus Pain or Congestion Earache    Final Disposition User   See Physician within 24 Hours [place], RN, Tillie Rung    Comments  Attempted to leave message, but mail box was full  Caller was upset with this RN and refused appointment. She states she should have gotten antibiotic last week but the doctor would not give her any. She wants the office to call her in something. She states she is bed ridden due to recent surgery and needs something called in. Told her I would fax this to office for her. She then hung up.   Referrals  GO TO FACILITY REFUSED   Disagree/Comply: Disagree  Disagree/Comply Reason: Disagree with instructions

## 2016-04-04 ENCOUNTER — Telehealth: Payer: Self-pay | Admitting: Family Medicine

## 2016-04-04 MED ORDER — AZITHROMYCIN 250 MG PO TABS: ORAL_TABLET | ORAL | Status: DC

## 2016-04-04 NOTE — Telephone Encounter (Signed)
[facility] - Client [place] Call Center Patient Name: Dana [name] DOB: August 06, 1966 Initial Comment caller states she has sore throat, runny nose and earache Nurse Assessment Nurse: Dimas Chyle, RN, Dellis Filbert Date/Time Eilene Ghazi Time): 04/04/2016 8:25:49 AM Confirm and document reason for call. If symptomatic, describe symptoms. You must click the next button to save text entered. ---Caller states she has sore throat, runny nose and earache. Running a low grade fever since yesterday. Symptoms for a week. Has the patient traveled out of the country within the last 30 days? ---No Does the patient have any new or worsening symptoms? ---Yes Will a triage be completed? ---Yes Related visit to physician within the last 2 weeks? ---Yes Does the PT have any chronic conditions? (i.e. diabetes, asthma, etc.) ---Yes List chronic conditions. ---HTN Is the patient pregnant or possibly pregnant? (Ask all females between the ages of 66-55) ---No Is this a behavioral health or substance abuse call? ---No Guidelines Guideline Title Affirmed Question Affirmed Notes Sore Throat Earache also present Final Disposition User See Physician within 24 Hours [place], RN, Dellis Filbert Comments Patient called in yesterday and was advised to be seen in the next 24 hours and refused due to shoulder surgery that she is recovering from. Patient previously seen in office on 03/25/16 and diagnosed with viral URI. Patient called back today and wants PCP to call in Rx for symptoms and again refused appointment. Referrals REFERRED TO PCP OFFICE Disagree/Comply: Comply

## 2016-04-04 NOTE — Telephone Encounter (Signed)
This sounds like a sinus infection. Call in Augmentin 875 bid for 10 days

## 2016-04-04 NOTE — Telephone Encounter (Signed)
See my reply on 04-04-16

## 2016-04-04 NOTE — Telephone Encounter (Signed)
Cancel the Augmentin. Instead call in a Zpack

## 2016-04-04 NOTE — Telephone Encounter (Signed)
I sent script e-scribe to [facility] and spoke with pt.  

## 2016-04-12 ENCOUNTER — Telehealth: Payer: Self-pay | Admitting: Family Medicine

## 2016-04-12 MED ORDER — HYDROMORPHONE HCL 4 MG PO TABS: 4.0000 mg | ORAL_TABLET | Freq: Four times a day (QID) | ORAL | 0 refills | Status: DC | PRN

## 2016-04-12 NOTE — Telephone Encounter (Signed)
Script is ready for pick up and I spoke with pt.  

## 2016-04-12 NOTE — Telephone Encounter (Signed)
Pt needs new rx hydromorphone °

## 2016-04-12 NOTE — Telephone Encounter (Signed)
done

## 2016-04-23 ENCOUNTER — Other Ambulatory Visit: Payer: Self-pay

## 2016-04-25 ENCOUNTER — Other Ambulatory Visit (INDEPENDENT_AMBULATORY_CARE_PROVIDER_SITE_OTHER): Payer: 59

## 2016-04-25 DIAGNOSIS — Z0000 Encounter for general adult medical examination without abnormal findings: Secondary | ICD-10-CM

## 2016-04-25 LAB — BASIC METABOLIC PANEL
BUN: 11 mg/dL (ref 6–23)
CO2: 25 mEq/L (ref 19–32)
Calcium: 8.9 mg/dL (ref 8.4–10.5)
Chloride: 103 mEq/L (ref 96–112)
Creatinine, Ser: 0.89 mg/dL (ref 0.40–1.20)
GFR: 86.12 mL/min (ref >60.00)
GLUCOSE: 95 mg/dL (ref 70–99)
POTASSIUM: 4.4 meq/L (ref 3.5–5.1)
Sodium: 138 mEq/L (ref 135–145)

## 2016-04-25 LAB — CBC WITH DIFFERENTIAL/PLATELET
BASOS PCT: 0.5 % (ref 0.0–3.0)
Basophils Absolute: 0 10*3/uL (ref 0.0–0.1)
EOS PCT: 4.3 % (ref 0.0–5.0)
Eosinophils Absolute: 0.3 10*3/uL (ref 0.0–0.7)
HCT: 37 % (ref 36.0–46.0)
Hemoglobin: 12.2 g/dL (ref 12.0–15.0)
LYMPHS ABS: 2.5 10*3/uL (ref 0.7–4.0)
Lymphocytes Relative: 31 % (ref 12.0–46.0)
MCHC: 33 g/dL (ref 30.0–36.0)
MCV: 87.8 fl (ref 78.0–100.0)
MONO ABS: 0.7 10*3/uL (ref 0.1–1.0)
Monocytes Relative: 9.2 % (ref 3.0–12.0)
NEUTROS PCT: 55 % (ref 43.0–77.0)
Neutro Abs: 4.4 10*3/uL (ref 1.4–7.7)
Platelets: 237 10*3/uL (ref 150.0–400.0)
RBC: 4.21 Mil/uL (ref 3.87–5.11)
RDW: 14.9 % (ref 11.5–15.5)
WBC: 7.9 10*3/uL (ref 4.0–10.5)

## 2016-04-25 LAB — HEPATIC FUNCTION PANEL
ALT: 14 U/L (ref 0–35)
AST: 17 U/L (ref 0–37)
Albumin: 3.8 g/dL (ref 3.5–5.2)
Alkaline Phosphatase: 76 U/L (ref 39–117)
BILIRUBIN TOTAL: 0.6 mg/dL (ref 0.2–1.2)
Bilirubin, Direct: 0.1 mg/dL (ref 0.0–0.3)
Total Protein: 6.7 g/dL (ref 6.0–8.3)

## 2016-04-25 LAB — POC URINALSYSI DIPSTICK (AUTOMATED)
BILIRUBIN UA: NEGATIVE
Blood, UA: NEGATIVE
Glucose, UA: NEGATIVE
KETONES UA: NEGATIVE
Nitrite, UA: NEGATIVE
PH UA: 6
Protein, UA: NEGATIVE
SPEC GRAV UA: 1.015
Urobilinogen, UA: 0.2

## 2016-04-25 LAB — LIPID PANEL
CHOLESTEROL: 255 mg/dL — AB (ref 0–200)
HDL: 58.2 mg/dL (ref >39.00)
LDL CALC: 164 mg/dL — AB (ref 0–99)
NonHDL: 196.76
TRIGLYCERIDES: 165 mg/dL — AB (ref 0.0–149.0)
Total CHOL/HDL Ratio: 4
VLDL: 33 mg/dL (ref 0.0–40.0)

## 2016-04-25 LAB — TSH: TSH: 3.2 u[IU]/mL (ref 0.35–4.50)

## 2016-04-29 ENCOUNTER — Encounter: Payer: Self-pay | Admitting: Family Medicine

## 2016-04-29 ENCOUNTER — Ambulatory Visit (INDEPENDENT_AMBULATORY_CARE_PROVIDER_SITE_OTHER): Payer: 59 | Admitting: Family Medicine

## 2016-04-29 VITALS — BP 160/90 | Temp 98.3°F | Ht 63.0 in | Wt 274.0 lb

## 2016-04-29 DIAGNOSIS — Z0000 Encounter for general adult medical examination without abnormal findings: Secondary | ICD-10-CM

## 2016-04-29 MED ORDER — ATORVASTATIN CALCIUM 20 MG PO TABS: 20.0000 mg | ORAL_TABLET | Freq: Every day | ORAL | 3 refills | Status: DC

## 2016-04-29 MED ORDER — METOPROLOL SUCCINATE ER 100 MG PO TB24: 100.0000 mg | ORAL_TABLET | Freq: Every day | ORAL | 3 refills | Status: DC

## 2016-04-29 NOTE — Progress Notes (Signed)
   Subjective:    Patient ID: Dana [name], female    DOB: 04-Oct-1965, 50 y.o.   MRN: [id number]  HPI 50 yr old female for a well exam. She feels fine except for right shoulder pain. She is recovering from a recent surgery per Dr. Tamera Punt. Her mammogram is scheduled for tomorrow.    Review of Systems  Constitutional: Negative.   HENT: Negative.   Eyes: Negative.   Respiratory: Negative.   Cardiovascular: Negative.   Gastrointestinal: Negative.   Genitourinary: Negative for decreased urine volume, difficulty urinating, dyspareunia, dysuria, enuresis, flank pain, frequency, hematuria, pelvic pain and urgency.  Musculoskeletal: Negative.   Skin: Negative.   Neurological: Negative.   Psychiatric/Behavioral: Negative.        Objective:   Physical Exam  Constitutional: She is oriented to person, place, and time. No distress.  Obese   HENT:  Head: Normocephalic and atraumatic.  Right Ear: External ear normal.  Left Ear: External ear normal.  Nose: Nose normal.  Mouth/Throat: Oropharynx is clear and moist. No oropharyngeal exudate.  Eyes: Conjunctivae and EOM are normal. Pupils are equal, round, and reactive to light. No scleral icterus.  Neck: Normal range of motion. Neck supple. No JVD present. No thyromegaly present.  Cardiovascular: Normal rate, regular rhythm, normal heart sounds and intact distal pulses.  Exam reveals no gallop and no friction rub.   No murmur heard. Pulmonary/Chest: Effort normal and breath sounds normal. No respiratory distress. She has no wheezes. She has no rales. She exhibits no tenderness.  Abdominal: Soft. Bowel sounds are normal. She exhibits no distension and no mass. There is no tenderness. There is no rebound and no guarding.  Musculoskeletal: Normal range of motion. She exhibits no edema or tenderness.  Lymphadenopathy:    She has no cervical adenopathy.  Neurological: She is alert and oriented to person, place, and time. She has normal  reflexes. No cranial nerve deficit. She exhibits normal muscle tone. Coordination normal.  Skin: Skin is warm and dry. No rash noted. No erythema.  Psychiatric: She has a normal mood and affect. Her behavior is normal. Judgment and thought content normal.          Assessment & Plan:  Well exam. We discussed diet and exercise. We will increase the Metoprolol succinate to 100 mg daily.  Start on Lipitor 20 mg daily. Recheck lipids and BP in 3 months.  [name], MD

## 2016-04-29 NOTE — Progress Notes (Signed)
Pre visit review using our clinic review tool, if applicable. No additional management support is needed unless otherwise documented below in the visit note. 

## 2016-04-30 ENCOUNTER — Ambulatory Visit
Admission: RE | Admit: 2016-04-30 | Discharge: 2016-04-30 | Disposition: A | Discharge status: Home / Self Care | Payer: 59 | Source: Ambulatory Visit

## 2016-04-30 DIAGNOSIS — Z1231 Encounter for screening mammogram for malignant neoplasm of breast: Secondary | ICD-10-CM

## 2016-05-13 ENCOUNTER — Encounter: Payer: Self-pay | Admitting: Gynecology

## 2016-05-13 DIAGNOSIS — Z0289 Encounter for other administrative examinations: Secondary | ICD-10-CM

## 2016-07-19 ENCOUNTER — Telehealth: Payer: Self-pay | Admitting: Family Medicine

## 2016-07-19 MED ORDER — HYDROMORPHONE HCL 4 MG PO TABS: 4.0000 mg | ORAL_TABLET | Freq: Four times a day (QID) | ORAL | 0 refills | Status: DC | PRN

## 2016-07-19 NOTE — Telephone Encounter (Signed)
Script is ready for pick up at front office and I spoke with pt.

## 2016-07-19 NOTE — Telephone Encounter (Signed)
done

## 2016-07-19 NOTE — Telephone Encounter (Signed)
Pt request refill  HYDROmorphone (DILAUDID) 4 MG tablet  Pt states she out and hopes she can get today.

## 2016-09-10 ENCOUNTER — Ambulatory Visit: Payer: Self-pay | Admitting: Internal Medicine

## 2016-09-24 ENCOUNTER — Ambulatory Visit (INDEPENDENT_AMBULATORY_CARE_PROVIDER_SITE_OTHER): Payer: Self-pay | Admitting: Orthopaedic Surgery

## 2016-10-10 ENCOUNTER — Telehealth: Payer: Self-pay | Admitting: Family Medicine

## 2016-10-10 NOTE — Telephone Encounter (Signed)
° ° ° °  Pt request refill of the following: ° °HYDROmorphone (DILAUDID) 4 MG tablet ° ° °Phamacy: °

## 2016-10-11 MED ORDER — HYDROMORPHONE HCL 4 MG PO TABS: 4.0000 mg | ORAL_TABLET | Freq: Four times a day (QID) | ORAL | 0 refills | Status: DC | PRN

## 2016-10-11 NOTE — Telephone Encounter (Signed)
NO, she is not due until Feb. 3

## 2016-10-11 NOTE — Telephone Encounter (Signed)
Done for one month only  

## 2016-10-11 NOTE — Telephone Encounter (Signed)
Script is ready for pick up here at front office and I spoke with pt.  

## 2016-10-11 NOTE — Addendum Note (Signed)
Addended by: [name] A on: 10/11/2016 01:09 PM   Modules accepted: Orders

## 2016-10-11 NOTE — Telephone Encounter (Signed)
Pt is aware of date, would still like to pick up, she is fine with the fill date.

## 2016-10-15 ENCOUNTER — Ambulatory Visit (INDEPENDENT_AMBULATORY_CARE_PROVIDER_SITE_OTHER): Payer: Self-pay | Admitting: Orthopaedic Surgery

## 2016-10-28 ENCOUNTER — Other Ambulatory Visit (INDEPENDENT_AMBULATORY_CARE_PROVIDER_SITE_OTHER): Payer: 59

## 2016-10-28 ENCOUNTER — Encounter (INDEPENDENT_AMBULATORY_CARE_PROVIDER_SITE_OTHER): Payer: Self-pay

## 2016-10-28 ENCOUNTER — Encounter: Payer: Self-pay | Admitting: Internal Medicine

## 2016-10-28 ENCOUNTER — Ambulatory Visit (INDEPENDENT_AMBULATORY_CARE_PROVIDER_SITE_OTHER): Payer: 59 | Admitting: Internal Medicine

## 2016-10-28 VITALS — BP 140/90 | HR 104 | Ht 63.0 in | Wt 279.0 lb

## 2016-10-28 DIAGNOSIS — G43A1 Cyclical vomiting, intractable: Secondary | ICD-10-CM | POA: Diagnosis not present

## 2016-10-28 DIAGNOSIS — R1115 Cyclical vomiting syndrome unrelated to migraine: Secondary | ICD-10-CM

## 2016-10-28 DIAGNOSIS — K580 Irritable bowel syndrome with diarrhea: Secondary | ICD-10-CM

## 2016-10-28 LAB — CBC WITH DIFFERENTIAL/PLATELET
BASOS PCT: 1.2 % (ref 0.0–3.0)
Basophils Absolute: 0.1 10*3/uL (ref 0.0–0.1)
EOS PCT: 1.5 % (ref 0.0–5.0)
Eosinophils Absolute: 0.1 10*3/uL (ref 0.0–0.7)
HCT: 39.9 % (ref 36.0–46.0)
Hemoglobin: 13 g/dL (ref 12.0–15.0)
LYMPHS ABS: 2.6 10*3/uL (ref 0.7–4.0)
Lymphocytes Relative: 27.5 % (ref 12.0–46.0)
MCHC: 32.7 g/dL (ref 30.0–36.0)
MCV: 92.3 fl (ref 78.0–100.0)
MONOS PCT: 8.5 % (ref 3.0–12.0)
Monocytes Absolute: 0.8 10*3/uL (ref 0.1–1.0)
NEUTROS ABS: 5.8 10*3/uL (ref 1.4–7.7)
NEUTROS PCT: 61.3 % (ref 43.0–77.0)
Platelets: 269 10*3/uL (ref 150.0–400.0)
RBC: 4.32 Mil/uL (ref 3.87–5.11)
RDW: 14.8 % (ref 11.5–15.5)
WBC: 9.5 10*3/uL (ref 4.0–10.5)

## 2016-10-28 LAB — BASIC METABOLIC PANEL
BUN: 10 mg/dL (ref 6–23)
CALCIUM: 8.7 mg/dL (ref 8.4–10.5)
CO2: 27 mEq/L (ref 19–32)
CREATININE: 0.9 mg/dL (ref 0.40–1.20)
Chloride: 106 mEq/L (ref 96–112)
GFR: 84.85 mL/min (ref >60.00)
GLUCOSE: 106 mg/dL — AB (ref 70–99)
Potassium: 4 mEq/L (ref 3.5–5.1)
SODIUM: 141 meq/L (ref 135–145)

## 2016-10-28 LAB — SEDIMENTATION RATE: Sed Rate: 29 mm/hr (ref 0–30)

## 2016-10-28 LAB — C-REACTIVE PROTEIN: CRP: 1.1 mg/dL (ref 0.5–20.0)

## 2016-10-28 NOTE — Progress Notes (Signed)
   Dana [name] 50 y.o. [birth year] [id number]  Assessment & Plan:   1. Irritable bowel syndrome with diarrhea   2. Intractable cyclical vomiting with nausea    The patient returns, I have seen her at least twice in the past with basically the same symptoms. EGD and colonoscopy including random biopsies are unrevealing. The working diagnosis is IBS. Her chronic nausea with sometimes vomiting is of unclear etiology. I explained that anxiety and stress are the most common cause of chronic nausea that I see. I tried to reassure as best I can. Nothing concerning so far and previous labs in the past or EGD and colonoscopy.  Evaluate with: CBC, BMET, fecal lactoferrin CRP, ESR  ? Other imaging such as a CT enterography depending on lab results  Try Zofran before meals to see if that helps.  ? Viberzi vs higher dose alosetron  ? Mirtazapine  I appreciate the opportunity to care for this patient. CC: [name], MD    Subjective:   Chief Complaint: Irritable bowel syndrome  HPI  The patient is here for follow-up, she was last seen 3 years ago. She's had chronic nausea and intermittent vomiting as well as postprandial diarrhea. She has been tried on dicyclomine, glycopyrrolate and Lotronex 0.5 mg twice a day. None of these has provided benefit according to the patient. Not had rectal bleeding, she's gained weight. The symptoms do disturb sleep she says. She cannot tell any dietary intolerance she avoids dairy but that doesn't make a difference. Does have mild periorbital pain, may have glaucoma. Workup underway. Additional GI review of systems appetite is off. Wt Readings from Last 3 Encounters:  10/28/16 279 lb (126.6 kg)  04/29/16 274 lb (124.3 kg)  03/25/16 270 lb (122.5 kg)   Medications, allergies, past medical history, past surgical history, family history and social history are reviewed and updated in the EMR.   Review of Systems As above. No significant headaches  just a periorbital pain. No chest pain or significant new dyspnea reported.  Objective:   Physical Exam BP 140/90   Pulse (!) 104   Ht '5\' 3"'$  (1.6 m)   Wt 279 lb (126.6 kg)   LMP 08/17/2012   BMI 49.42 kg/m  Pleasant obese black woman in no acute distress  Eyes anicteric Lungs are clear S1-S2 no rubs murmurs or gallops Abdomen is morbidly obese soft nontender without organomegaly or mass She is alert and oriented 3 and has an appropriate and normal mood and affect

## 2016-10-28 NOTE — Patient Instructions (Signed)
   Your physician has requested that you go to the basement for the lab work before leaving today.    Try the zofran before meals that you have at home for your nausea, vomiting and diarrhea.   I appreciate the opportunity to care for you. Silvano Rusk, MD, [facility]

## 2016-10-29 NOTE — Progress Notes (Signed)
Labs are all ok Still seems like IBS Ask her what result she had with trial of ondansetron qAC and I will decide next recommendation

## 2016-10-30 ENCOUNTER — Other Ambulatory Visit: Payer: 59

## 2016-10-30 DIAGNOSIS — K580 Irritable bowel syndrome with diarrhea: Secondary | ICD-10-CM

## 2016-10-31 LAB — FECAL LACTOFERRIN, QUANT: LACTOFERRIN: NEGATIVE

## 2016-11-01 ENCOUNTER — Other Ambulatory Visit: Payer: Self-pay

## 2016-11-01 MED ORDER — RIFAXIMIN 550 MG PO TABS: 550.0000 mg | ORAL_TABLET | Freq: Three times a day (TID) | ORAL | 0 refills | Status: DC

## 2016-11-01 NOTE — Progress Notes (Signed)
No inflammation in stools So far all info points to IBS-D Have her try Xifaxan 550 mg tid x 14 days - she has failed multiple agents you can see in my last note  Go ahead and get a f/u for April  We will continue to work through things as we go along so have her update me after Xifaxan to see if that helps

## 2016-11-04 ENCOUNTER — Telehealth: Payer: Self-pay

## 2016-11-04 NOTE — Telephone Encounter (Signed)
noted 

## 2016-11-05 ENCOUNTER — Telehealth: Payer: Self-pay | Admitting: Internal Medicine

## 2016-11-05 NOTE — Telephone Encounter (Signed)
Please advise alternative to xifaxan.  Patient can't afford

## 2016-11-05 NOTE — Telephone Encounter (Signed)
Patient provided the phone number to [facility].  She will call them to inquire about arranging shipment

## 2016-11-07 MED ORDER — METRONIDAZOLE 500 MG PO TABS: 500.0000 mg | ORAL_TABLET | Freq: Three times a day (TID) | ORAL | 0 refills | Status: DC

## 2016-11-07 NOTE — Telephone Encounter (Signed)
Metronidazole 500 mg tid x 10 d

## 2016-11-07 NOTE — Telephone Encounter (Signed)
Patient notified.  She will call with an update after she has completed the rx

## 2016-11-11 ENCOUNTER — Ambulatory Visit (INDEPENDENT_AMBULATORY_CARE_PROVIDER_SITE_OTHER): Payer: 59 | Admitting: Family Medicine

## 2016-11-11 ENCOUNTER — Encounter: Payer: Self-pay | Admitting: Family Medicine

## 2016-11-11 VITALS — BP 150/88 | Temp 98.1°F | Ht 63.0 in | Wt 283.0 lb

## 2016-11-11 DIAGNOSIS — M7711 Lateral epicondylitis, right elbow: Secondary | ICD-10-CM

## 2016-11-11 MED ORDER — DICLOFENAC SODIUM 75 MG PO TBEC: 75.0000 mg | DELAYED_RELEASE_TABLET | Freq: Two times a day (BID) | ORAL | 2 refills | Status: DC

## 2016-11-11 NOTE — Progress Notes (Signed)
   Subjective:    Patient ID: Dana [name], female    DOB: 04/18/1966, 51 y.o.   MRN: [id number]  HPI Here for one month of pain in the right elbow.  No hx of trauma. No swelling.    Review of Systems  Constitutional: Negative.   Musculoskeletal: Positive for arthralgias.       Objective:   Physical Exam  Constitutional: She appears well-developed and well-nourished.  Cardiovascular: Normal rate, regular rhythm, normal heart sounds and intact distal pulses.   Pulmonary/Chest: Effort normal and breath sounds normal.  Musculoskeletal:  Tender over the right lateral epicondyle. ROM of the right elbow is limited by pain          Assessment & Plan:  Lateral epicondylitis. Rest, ice packs, Diclofenac bid. We also referred her to Orthopedics  at her request.  [name], MD

## 2016-11-11 NOTE — Progress Notes (Signed)
Pre visit review using our clinic review tool, if applicable. No additional management support is needed unless otherwise documented below in the visit note. 

## 2016-11-18 ENCOUNTER — Encounter (INDEPENDENT_AMBULATORY_CARE_PROVIDER_SITE_OTHER): Payer: Self-pay | Admitting: Orthopaedic Surgery

## 2016-11-18 ENCOUNTER — Ambulatory Visit (INDEPENDENT_AMBULATORY_CARE_PROVIDER_SITE_OTHER): Payer: 59

## 2016-11-18 ENCOUNTER — Ambulatory Visit (INDEPENDENT_AMBULATORY_CARE_PROVIDER_SITE_OTHER): Payer: 59 | Admitting: Orthopaedic Surgery

## 2016-11-18 DIAGNOSIS — M25521 Pain in right elbow: Secondary | ICD-10-CM

## 2016-11-18 MED ORDER — BUPIVACAINE HCL 0.5 % IJ SOLN
1.0000 mL | INTRAMUSCULAR | Status: AC | PRN
  Administered 2016-11-18: 1 mL

## 2016-11-18 MED ORDER — LIDOCAINE HCL 1 % IJ SOLN
1.0000 mL | INTRAMUSCULAR | Status: AC | PRN
  Administered 2016-11-18: 1 mL

## 2016-11-18 MED ORDER — OXYCODONE-ACETAMINOPHEN 5-325 MG PO TABS: 1.0000 | ORAL_TABLET | Freq: Every day | ORAL | 0 refills | Status: DC | PRN

## 2016-11-18 MED ORDER — METHYLPREDNISOLONE ACETATE 40 MG/ML IJ SUSP
40.0000 mg | INTRAMUSCULAR | Status: AC | PRN
  Administered 2016-11-18: 40 mg

## 2016-11-18 NOTE — Progress Notes (Signed)
Office Visit Note   [name]           Date of Birth: May 31, 1966           MRN: [id number] Visit Date: 11/18/2016              Requested by: [name], MD [place], [place] [id number] PCP: [name], MD   Assessment & Plan: Visit Diagnoses:  1. Pain in right elbow     Plan: We will go ahead and put her in physical therapy. Injection was performed also. She requested Percocet which I gave her 10 tablets. I'll see her back as needed.  Follow-Up Instructions: Return if symptoms worsen or fail to improve.   Orders:  Orders Placed This Encounter  Procedures  . XR Elbow 2 Views Right   Meds ordered this encounter  Medications  . oxyCODONE-acetaminophen (PERCOCET) 5-325 MG tablet    Sig: Take 1 tablet by mouth daily as needed for severe pain.    Dispense:  10 tablet    Refill:  0      Procedures: Hand/UE Inj Date/Time: 11/18/2016 2:37 PM Performed by: Leandrew Koyanagi Authorized by: Leandrew Koyanagi   Consent Given by:  Patient Timeout: prior to procedure the correct patient, procedure, and site was verified   Indications:  Pain Condition: lateral epicondylitis   Site:  R elbow Prep: patient was prepped and draped in usual sterile fashion   Needle Size:  25 G Medications:  1 mL lidocaine 1 %; 1 mL bupivacaine 0.5 %; 40 mg methylPREDNISolone acetate 40 MG/ML     Clinical Data: No additional findings.   Subjective: Chief Complaint  Patient presents with  . Right Elbow - Pain    Patient comes in today with right elbow pain for about a month. She is right-hand-dominant. She is very tearful and seems somewhat hysterical. She has had a redness own tapers before for other conditions and she says they do not work. Her pain is 10 out of 10. She is currently not working. She has severe pain throughout the elbow she endorses tingling burning and numbness. Denies a constitutional symptoms    Review of Systems  Constitutional:  Negative.   HENT: Negative.   Eyes: Negative.   Respiratory: Negative.   Cardiovascular: Negative.   Endocrine: Negative.   Musculoskeletal: Negative.   Neurological: Negative.   Hematological: Negative.   Psychiatric/Behavioral: Negative.   All other systems reviewed and are negative.    Objective: Vital Signs: LMP 08/17/2012   Physical Exam  Constitutional: She is oriented to person, place, and time. She appears well-developed and well-nourished.  HENT:  Head: Normocephalic and atraumatic.  Eyes: EOM are normal.  Neck: Neck supple.  Pulmonary/Chest: Effort normal.  Abdominal: Soft.  Neurological: She is alert and oriented to person, place, and time.  Skin: Skin is warm. Capillary refill takes less than 2 seconds.  Psychiatric: She has a normal mood and affect. Her behavior is normal. Judgment and thought content normal.  Nursing note and vitals reviewed.   Ortho Exam Exam of the right elbow shows no apparent swelling although patient endorses subjective swelling. She has pain with essentially tenderness throughout her elbow most over the lateral aspect. She states that she cannot move her elbow or her wrist. Specialty Comments:  No specialty comments available.  Imaging: No results found.   PMFS History: Patient Active Problem List   Diagnosis Date Noted  . Lateral epicondylitis of  right elbow 11/11/2016  . Muscle cramps 01/15/2016  . Bilateral leg edema 01/08/2016  . Radiculopathy 12/20/2015  . Hyperglycemia 12/21/2014  . Mastodynia, female 11/30/2014  . S/P excision of fibroadenoma of breast 11/30/2014  . Angioedema of lips 04/07/2014  . Fibroadenoma of right breast 03/07/2014  . Pelvic pain in female 06/19/2012  . Menorrhagia 06/11/2012  . SUI (stress urinary incontinence, female) 06/11/2012  . HTN (hypertension) 06/11/2012  . IBS (irritable bowel syndrome) - diarrhea predominant 03/17/2012  . MICROSCOPIC HEMATURIA 11/30/2010  . BREAST PAIN, RIGHT  11/30/2010  . BREAST MASS, RIGHT 01/12/2010  . HYPERCHOLESTEROLEMIA 12/07/2007  . CIGARETTE SMOKER 12/07/2007  . DEGENERATIVE JOINT DISEASE 12/07/2007  . Low back pain with sciatica 12/07/2007  . HEADACHE 12/07/2007  . Obesity 08/03/2007  . ANXIETY 08/03/2007   Past Medical History:  Diagnosis Date  . Anxiety   . Back pain with radiation   . Breast mass, right   . Complication of anesthesia 04/07/14  . DJD (degenerative joint disease)   . Groin abscess   . Headache(784.0)   . History of IBS   . Hypercholesterolemia   . IBS (irritable bowel syndrome)   . Lactose intolerance   . Mild hypertension   . Obesity   . Tobacco use disorder     Family History  Problem Relation Age of Onset  . Diabetes Father   . Diabetes Mother   . Prostate cancer Paternal Grandfather   . Breast cancer Maternal Aunt 46    Past Surgical History:  Procedure Laterality Date  . ABDOMINAL HYSTERECTOMY    . ANTERIOR CERVICAL DECOMP/DISCECTOMY FUSION N/A 12/20/2015   Procedure: ANTERIOR CERVICAL DECOMPRESSION FUSION CERVICAL 4-5, CERVICAL 5-6, CERVICAL 6-7 WITH INSTRUMENTATION AND ALLOGRAFT;  Surgeon: Phylliss Bob, MD;  Location: [facility];  Service: Orthopedics;  Laterality: N/A;  Anterior cervical decompression fusion, cervical 4-5, cervical 5-6, cervical 6-7 with instrumentation and allograft  . BACK SURGERY    . BILATERAL SALPINGECTOMY  09/03/2012   Procedure: BILATERAL SALPINGECTOMY;  Surgeon: [name], MD;  Location: [facility];  Service: Gynecology;  Laterality: Bilateral;  . BREAST BIOPSY Right 04/07/2014   Procedure: REMOVAL RIGHT BREAST MASS WITH WIRE LOCALIZATION;  Surgeon: [name], MD;  Location: [facility];  Service: General;  Laterality: Right;  . BREAST LUMPECTOMY     x2  . COLONOSCOPY W/ BIOPSIES  04/24/2012   per Dr. Carlean Purl, clear, repeat in 10 yrs   . disectomy    . ESOPHAGOGASTRODUODENOSCOPY    . FINGER SURGERY     Right index-excision of mass   . FOOT SURGERY Right    Bone  Spurs  . LAPAROSCOPIC HYSTERECTOMY  09/03/2012   Procedure: HYSTERECTOMY TOTAL LAPAROSCOPIC;  Surgeon: [name], MD;  Location: [facility];  Service: Gynecology;  Laterality: N/A;  . LUMBAR [place]    . TUBAL LIGATION     Social History   Occupational History  . [name]    Social History Main Topics  . Smoking status: Former Smoker    Years: 33.00    Types: Cigarettes  . Smokeless tobacco: Never Used     Comment: quit March 2017  . Alcohol use No     Comment: occ  . Drug use: No  . Sexual activity: Yes    Birth control/ protection: Surgical     Comment: tubal ligation

## 2016-11-19 DIAGNOSIS — Z0271 Encounter for disability determination: Secondary | ICD-10-CM

## 2016-11-21 ENCOUNTER — Telehealth: Payer: Self-pay | Admitting: Family Medicine

## 2016-11-21 MED ORDER — HYDROMORPHONE HCL 4 MG PO TABS: 4.0000 mg | ORAL_TABLET | Freq: Four times a day (QID) | ORAL | 0 refills | Status: DC | PRN

## 2016-11-21 NOTE — Telephone Encounter (Signed)
done

## 2016-11-21 NOTE — Telephone Encounter (Signed)
Script is ready for pick up here at front office, tried to reach pt and no answer.  

## 2016-11-21 NOTE — Telephone Encounter (Addendum)
Pt needs new rx hydromorphone

## 2016-11-28 ENCOUNTER — Encounter (INDEPENDENT_AMBULATORY_CARE_PROVIDER_SITE_OTHER): Payer: Self-pay | Admitting: Family

## 2016-11-28 ENCOUNTER — Ambulatory Visit (INDEPENDENT_AMBULATORY_CARE_PROVIDER_SITE_OTHER): Payer: 59 | Admitting: Family

## 2016-11-28 ENCOUNTER — Telehealth (INDEPENDENT_AMBULATORY_CARE_PROVIDER_SITE_OTHER): Payer: Self-pay | Admitting: Radiology

## 2016-11-28 ENCOUNTER — Ambulatory Visit (INDEPENDENT_AMBULATORY_CARE_PROVIDER_SITE_OTHER): Payer: 59

## 2016-11-28 VITALS — Ht 63.0 in | Wt 283.0 lb

## 2016-11-28 DIAGNOSIS — M5441 Lumbago with sciatica, right side: Secondary | ICD-10-CM | POA: Diagnosis not present

## 2016-11-28 DIAGNOSIS — M545 Low back pain, unspecified: Secondary | ICD-10-CM

## 2016-11-28 MED ORDER — OXYCODONE-ACETAMINOPHEN 5-325 MG PO TABS: 1.0000 | ORAL_TABLET | Freq: Two times a day (BID) | ORAL | 0 refills | Status: DC | PRN

## 2016-11-28 NOTE — Telephone Encounter (Signed)
Pharmacist calling today wanting to inform us patient is getting 120 diluadid from Dr. [name]. Advised that Junie Panning was not aware. Advised that to void percocet prescription. Advised patient over the phone that pharmacy will be unable to fill the percocet prescription for her. She said Junie Panning was rude and not wanting to help her. Advised this was not the case. She offered her prednisone taper, and patient declined this. She offered MRI to further investigate pathology. That Erin or any provider cannot injection her with a needle blindly without MRI imaging. I apologized for her feeling that way, but prescribing her additional medication can cause her to go into respiratory distress, and is further more again the law enforced by our government. Advised her if her pain is unmanageable with dilaudid 4mg , than she should go to the ER to find out what else is going on. Patient very argumentative. I advised her MRI would need to be approved by insurance before scheduling and this often takes several days for approval. Advised her to take medication she is currently on diluadid.

## 2016-11-28 NOTE — Progress Notes (Signed)
Office Visit Note   [name]           Date of Birth: Dec 13, 1965           MRN: [id number] Visit Date: 11/28/2016              Requested by: [name], MD [place],  [id number] PCP: [name], MD  Chief Complaint  Patient presents with  . Lower Back - Pain    Pt has a history of left L2-3 microdiscectomy 2009    HPI: Patient states that she fell OOB 11/27/16 and since has had horrible back pain. She states that it is right sided pain and that it radiates down her posterior thigh and that her foot is numb. She states that she has been using Pennsaid and that this has not helped and that the pain is constant. She is very tearful while discussing symptoms. Autumn L Forrest, RMA  Is taking an oral antiinflammatory as well. Requesting "shot in my back" today.   Patient tearful complaining of right low back, buttock and lateral thigh pain. This radiates down to her foot. Some numbness in foot as well. Constant. No relieving factors. Denies weakness. No loss of bowel or bladder control.   Assessment & Plan: Visit Diagnoses:  1. Lumbar spine pain   2. Acute right-sided low back pain with right-sided sciatica     Plan: Patient declined Prednisone taper today. States these never work for her. Last MRI of L spine in 2009. Have discussed that this would need to be updated prior to [facility]. Will order this today. Can try to get her over to Dr. Ernestina Patches pending results.   Follow up in office with Dr. Louanne Skye per patient preference.   Follow-Up Instructions: Return for p mri with nitka.   Physical Exam  Constitutional: Appears well-developed.  Head: Normocephalic.  Eyes: EOM are normal.  Neck: Normal range of motion.  Cardiovascular: Normal rate.   Pulmonary/Chest: Effort normal.  Neurological: Is alert.  Skin: Skin is warm.  Psychiatric: Has a normal mood and affect. Steady gait.  Back Exam   Tenderness  The patient is experiencing  tenderness in the lumbar (diffuse).  Muscle Strength  The patient has normal back strength.  Tests  Straight leg raise right: negative Straight leg raise left: negative  Other  Gait: normal       Imaging: Xr Lumbar Spine 2-3 Views  Result Date: 11/28/2016 Radiographs of the lumbar spine are negative for acute finding. Is status post discectomy L2-L3. No spondylosis or spondylolisthesis.    Labs: Lab Results  Component Value Date   ESRSEDRATE 29 10/28/2016   CRP 1.1 10/28/2016   LABORGA Multiple bacterial morphotypes present, none 10/15/2012   LABORGA predominant. Suggest appropriate recollection if  10/15/2012   LABORGA clinically indicated. 10/15/2012    Orders:  Orders Placed This Encounter  Procedures  . XR Lumbar Spine 2-3 Views  . MR Lumbar Spine w/o contrast   Meds ordered this encounter  Medications  . oxyCODONE-acetaminophen (PERCOCET) 5-325 MG tablet    Sig: Take 1 tablet by mouth 2 (two) times daily as needed for severe pain.    Dispense:  14 tablet    Refill:  0     Procedures: No procedures performed  Clinical Data: No additional findings.  Subjective: Review of Systems  Constitutional: Negative for chills and fever.    Objective: Vital Signs: Ht 5\' 3"  (1.6 m)   Wt 283  lb (128.4 kg)   LMP 08/17/2012   BMI 50.13 kg/m   Specialty Comments:  No specialty comments available.  PMFS History: Patient Active Problem List   Diagnosis Date Noted  . Lateral epicondylitis of right elbow 11/11/2016  . Muscle cramps 01/15/2016  . Bilateral leg edema 01/08/2016  . Radiculopathy 12/20/2015  . Hyperglycemia 12/21/2014  . Mastodynia, female 11/30/2014  . S/P excision of fibroadenoma of breast 11/30/2014  . Angioedema of lips 04/07/2014  . Fibroadenoma of right breast 03/07/2014  . Pelvic pain in female 06/19/2012  . Menorrhagia 06/11/2012  . SUI (stress urinary incontinence, female) 06/11/2012  . HTN (hypertension) 06/11/2012  . IBS  (irritable bowel syndrome) - diarrhea predominant 03/17/2012  . MICROSCOPIC HEMATURIA 11/30/2010  . BREAST PAIN, RIGHT 11/30/2010  . BREAST MASS, RIGHT 01/12/2010  . HYPERCHOLESTEROLEMIA 12/07/2007  . CIGARETTE SMOKER 12/07/2007  . DEGENERATIVE JOINT DISEASE 12/07/2007  . Low back pain with sciatica 12/07/2007  . HEADACHE 12/07/2007  . Obesity 08/03/2007  . ANXIETY 08/03/2007   Past Medical History:  Diagnosis Date  . Anxiety   . Back pain with radiation   . Breast mass, right   . Complication of anesthesia 04/07/14  . DJD (degenerative joint disease)   . Groin abscess   . Headache(784.0)   . History of IBS   . Hypercholesterolemia   . IBS (irritable bowel syndrome)   . Lactose intolerance   . Mild hypertension   . Obesity   . Tobacco use disorder     Family History  Problem Relation Age of Onset  . Diabetes Father   . Diabetes Mother   . Prostate cancer Paternal Grandfather   . Breast cancer Maternal Aunt 46    Past Surgical History:  Procedure Laterality Date  . ABDOMINAL HYSTERECTOMY    . ANTERIOR CERVICAL DECOMP/DISCECTOMY FUSION N/A 12/20/2015   Procedure: ANTERIOR CERVICAL DECOMPRESSION FUSION CERVICAL 4-5, CERVICAL 5-6, CERVICAL 6-7 WITH INSTRUMENTATION AND ALLOGRAFT;  Surgeon: Phylliss Bob, MD;  Location: [facility];  Service: Orthopedics;  Laterality: N/A;  Anterior cervical decompression fusion, cervical 4-5, cervical 5-6, cervical 6-7 with instrumentation and allograft  . BACK SURGERY    . BILATERAL SALPINGECTOMY  09/03/2012   Procedure: BILATERAL SALPINGECTOMY;  Surgeon: [name], MD;  Location: [facility];  Service: Gynecology;  Laterality: Bilateral;  . BREAST BIOPSY Right 04/07/2014   Procedure: REMOVAL RIGHT BREAST MASS WITH WIRE LOCALIZATION;  Surgeon: [name], MD;  Location: [facility];  Service: General;  Laterality: Right;  . BREAST LUMPECTOMY     x2  . COLONOSCOPY W/ BIOPSIES  04/24/2012   per Dr. Carlean Purl, clear, repeat in 10 yrs   . disectomy     . ESOPHAGOGASTRODUODENOSCOPY    . FINGER SURGERY     Right index-excision of mass   . FOOT SURGERY Right    Bone Spurs  . LAPAROSCOPIC HYSTERECTOMY  09/03/2012   Procedure: HYSTERECTOMY TOTAL LAPAROSCOPIC;  Surgeon: [name], MD;  Location: [facility];  Service: Gynecology;  Laterality: N/A;  . LUMBAR [place]    . TUBAL LIGATION     Social History   Occupational History  . [name]    Social History Main Topics  . Smoking status: Former Smoker    Years: 33.00    Types: Cigarettes  . Smokeless tobacco: Never Used     Comment: quit March 2017  . Alcohol use No     Comment: occ  . Drug use: No  . Sexual activity:  Yes    Birth control/ protection: Surgical     Comment: tubal ligation

## 2016-11-29 ENCOUNTER — Telehealth (INDEPENDENT_AMBULATORY_CARE_PROVIDER_SITE_OTHER): Payer: Self-pay | Admitting: Orthopaedic Surgery

## 2016-11-29 NOTE — Telephone Encounter (Signed)
RECORDS MAILED [facility]

## 2016-12-11 ENCOUNTER — Other Ambulatory Visit (INDEPENDENT_AMBULATORY_CARE_PROVIDER_SITE_OTHER): Payer: Self-pay | Admitting: Family

## 2016-12-11 DIAGNOSIS — M5416 Radiculopathy, lumbar region: Secondary | ICD-10-CM

## 2016-12-16 ENCOUNTER — Telehealth: Payer: Self-pay | Admitting: Family Medicine

## 2016-12-16 NOTE — Telephone Encounter (Signed)
Pt need new Rx for hydromorphone   Pt is aware of 3 business days for refills and someone will call when ready for pick up.[place] paper chart abstracted.[place] paper chart abstracted.

## 2016-12-17 MED ORDER — HYDROMORPHONE HCL 4 MG PO TABS: 4.0000 mg | ORAL_TABLET | Freq: Four times a day (QID) | ORAL | 0 refills | Status: DC | PRN

## 2016-12-17 NOTE — Telephone Encounter (Signed)
done

## 2016-12-18 ENCOUNTER — Ambulatory Visit (INDEPENDENT_AMBULATORY_CARE_PROVIDER_SITE_OTHER): Payer: Self-pay | Admitting: Family

## 2016-12-18 NOTE — Telephone Encounter (Signed)
Script is ready for pick up here at front office and I left a voice message with this information.  

## 2016-12-20 ENCOUNTER — Telehealth (INDEPENDENT_AMBULATORY_CARE_PROVIDER_SITE_OTHER): Payer: Self-pay | Admitting: *Deleted

## 2016-12-20 NOTE — Telephone Encounter (Signed)
Pt is scheduled at [facility] MRI on Sat April 14 at 11am, pt is to arrive 15 mins early to register thru the ED dept and to let them know that she is outpt and is scheduled for MRI. LMTRC to pt for appt information.

## 2016-12-23 NOTE — Telephone Encounter (Signed)
Called pt again, left message to return my call, pending call back

## 2016-12-24 ENCOUNTER — Encounter (INDEPENDENT_AMBULATORY_CARE_PROVIDER_SITE_OTHER): Payer: Self-pay | Admitting: *Deleted

## 2016-12-24 NOTE — Telephone Encounter (Signed)
Tried calling pt numerous times to advise of appt, no call back, sent pt letter with appt information.

## 2016-12-28 ENCOUNTER — Ambulatory Visit (HOSPITAL_COMMUNITY): Payer: 59

## 2016-12-31 ENCOUNTER — Other Ambulatory Visit (INDEPENDENT_AMBULATORY_CARE_PROVIDER_SITE_OTHER): Payer: Self-pay | Admitting: Family

## 2016-12-31 ENCOUNTER — Ambulatory Visit: Payer: Self-pay | Admitting: Internal Medicine

## 2016-12-31 DIAGNOSIS — M5441 Lumbago with sciatica, right side: Secondary | ICD-10-CM

## 2017-01-06 DIAGNOSIS — H25813 Combined forms of age-related cataract, bilateral: Secondary | ICD-10-CM | POA: Diagnosis not present

## 2017-01-06 DIAGNOSIS — H401132 Primary open-angle glaucoma, bilateral, moderate stage: Secondary | ICD-10-CM | POA: Diagnosis not present

## 2017-01-07 ENCOUNTER — Telehealth (INDEPENDENT_AMBULATORY_CARE_PROVIDER_SITE_OTHER): Payer: Self-pay | Admitting: Radiology

## 2017-01-07 ENCOUNTER — Ambulatory Visit (HOSPITAL_COMMUNITY): Admission: RE | Admit: 2017-01-07 | Payer: 59 | Source: Ambulatory Visit

## 2017-01-07 NOTE — Telephone Encounter (Signed)
[facility] Radiology calling requesting an order be placed in [facility] for patients MRI Lumbar without contrast. Advised I would place one. There is actually an order that was placed again on 16th of April. Did not want to duplicate order in patients chart. Cancelled my order and left original MRI without contrast for Lspine.

## 2017-01-22 ENCOUNTER — Telehealth: Payer: Self-pay | Admitting: Family Medicine

## 2017-01-22 MED ORDER — HYDROMORPHONE HCL 4 MG PO TABS: 4.0000 mg | ORAL_TABLET | Freq: Four times a day (QID) | ORAL | 0 refills | Status: DC | PRN

## 2017-01-22 NOTE — Telephone Encounter (Signed)
° ° °  Pt request refill of the following: ° °HYDROmorphone (DILAUDID) 4 MG tablet ° ° °Phamacy: °

## 2017-01-22 NOTE — Telephone Encounter (Signed)
done

## 2017-01-22 NOTE — Telephone Encounter (Signed)
Script is ready for pick up here at front office, tried to reach pt and no answer.  

## 2017-01-29 ENCOUNTER — Encounter: Payer: Self-pay | Admitting: Gynecology

## 2017-02-04 DIAGNOSIS — H401132 Primary open-angle glaucoma, bilateral, moderate stage: Secondary | ICD-10-CM | POA: Diagnosis not present

## 2017-02-19 ENCOUNTER — Ambulatory Visit (INDEPENDENT_AMBULATORY_CARE_PROVIDER_SITE_OTHER): Payer: 59 | Admitting: Family Medicine

## 2017-02-19 ENCOUNTER — Encounter: Payer: Self-pay | Admitting: Family Medicine

## 2017-02-19 VITALS — BP 130/82 | Temp 98.7°F | Ht 63.0 in | Wt 278.0 lb

## 2017-02-19 DIAGNOSIS — J019 Acute sinusitis, unspecified: Secondary | ICD-10-CM | POA: Diagnosis not present

## 2017-02-19 MED ORDER — HYDROMORPHONE HCL 4 MG PO TABS: 4.0000 mg | ORAL_TABLET | Freq: Four times a day (QID) | ORAL | 0 refills | Status: DC | PRN

## 2017-02-19 MED ORDER — LEVOFLOXACIN 500 MG PO TABS: 500.0000 mg | ORAL_TABLET | Freq: Every day | ORAL | 0 refills | Status: DC

## 2017-02-19 MED ORDER — LEVOFLOXACIN 500 MG PO TABS: 500.0000 mg | ORAL_TABLET | Freq: Every day | ORAL | 0 refills | Status: AC

## 2017-02-19 NOTE — Progress Notes (Signed)
   Subjective:    Patient ID: CLARISSA LAIRD, female    DOB: 30-Sep-1965, 51 y.o.   MRN: [id number]  HPI Here for 3 days of sinus pressure, PND, ST, and a dry cough.    Review of Systems  Constitutional: Negative.   HENT: Positive for congestion, postnasal drip, sinus pain, sinus pressure and sore throat.   Eyes: Negative.   Respiratory: Positive for cough.        Objective:   Physical Exam  Constitutional: She appears well-developed and well-nourished.  HENT:  Right Ear: External ear normal.  Left Ear: External ear normal.  Nose: Nose normal.  Mouth/Throat: Oropharynx is clear and moist.  Eyes: Conjunctivae are normal.  Neck: No thyromegaly present.  Pulmonary/Chest: Effort normal and breath sounds normal. No respiratory distress. She has no wheezes. She has no rales.  Lymphadenopathy:    She has no cervical adenopathy.          Assessment & Plan:  Sinusitis, treat with Levaquin. Add Delsym prn.  [name], MD

## 2017-02-19 NOTE — Patient Instructions (Signed)
WE NOW OFFER   [facility] FAST TRACK!!!  SAME DAY Appointments for ACUTE CARE  Such as: Sprains, Injuries, cuts, abrasions, rashes, muscle pain, joint pain, back pain Colds, flu, sore throats, headache, allergies, cough, fever  Ear pain, sinus and eye infections Abdominal pain, nausea, vomiting, diarrhea, upset stomach Animal/insect bites  3 Easy Ways to Schedule: Walk-In Scheduling Call in scheduling Mychart Sign-up: [email or web address]         

## 2017-02-27 ENCOUNTER — Ambulatory Visit (INDEPENDENT_AMBULATORY_CARE_PROVIDER_SITE_OTHER): Payer: 59 | Admitting: Orthopaedic Surgery

## 2017-02-27 ENCOUNTER — Ambulatory Visit (INDEPENDENT_AMBULATORY_CARE_PROVIDER_SITE_OTHER): Payer: 59

## 2017-02-27 DIAGNOSIS — M7711 Lateral epicondylitis, right elbow: Secondary | ICD-10-CM | POA: Diagnosis not present

## 2017-02-27 DIAGNOSIS — M1711 Unilateral primary osteoarthritis, right knee: Secondary | ICD-10-CM | POA: Diagnosis not present

## 2017-02-27 MED ORDER — LIDOCAINE HCL 1 % IJ SOLN
1.0000 mL | INTRAMUSCULAR | Status: AC | PRN
  Administered 2017-02-27: 1 mL

## 2017-02-27 MED ORDER — BUPIVACAINE HCL 0.5 % IJ SOLN
1.0000 mL | INTRAMUSCULAR | Status: AC | PRN
  Administered 2017-02-27: 1 mL

## 2017-02-27 MED ORDER — METHYLPREDNISOLONE ACETATE 40 MG/ML IJ SUSP
40.0000 mg | INTRAMUSCULAR | Status: AC | PRN
  Administered 2017-02-27: 40 mg

## 2017-02-27 NOTE — Progress Notes (Signed)
Office Visit Note   [name]           Date of Birth: 04-08-1966           MRN: [id number] Visit Date: 02/27/2017              Requested by: [name], MD [place],  [id number] PCP: [name], MD   Assessment & Plan: Visit Diagnoses:  1. Primary osteoarthritis of right knee   2. Lateral epicondylitis of right elbow     Plan: Right knee and right was injected today. We discussed the importance of weight loss. Follow-up with me as needed.  Follow-Up Instructions: Return if symptoms worsen or fail to improve.   Orders:  Orders Placed This Encounter  Procedures  . XR Knee 1-2 Views Right   No orders of the defined types were placed in this encounter.     Procedures: Large Joint Inj Date/Time: 02/27/2017 9:47 AM Performed by: Leandrew Koyanagi Authorized by: Leandrew Koyanagi   Consent Given by:  Patient Timeout: prior to procedure the correct patient, procedure, and site was verified   Indications:  Pain Location:  Knee Site:  R knee Prep: patient was prepped and draped in usual sterile fashion   Needle Size:  22 G Ultrasound Guidance: No   Fluoroscopic Guidance: No   Arthrogram: No   Patient tolerance:  Patient tolerated the procedure well with no immediate complications Hand/UE Inj Date/Time: 02/27/2017 9:48 AM Performed by: Leandrew Koyanagi Authorized by: Leandrew Koyanagi   Consent Given by:  Patient Timeout: prior to procedure the correct patient, procedure, and site was verified   Indications:  Pain Condition: lateral epicondylitis   Site:  R elbow Prep: patient was prepped and draped in usual sterile fashion   Needle Size:  25 G Medications:  1 mL lidocaine 1 %; 1 mL bupivacaine 0.5 %; 40 mg methylPREDNISolone acetate 40 MG/ML     Clinical Data: No additional findings.   Subjective: No chief complaint on file.   Patient is a 51 year old female comes back with right tennis elbow and right knee pain that's  been getting worse over the last 4 weeks. She did get a good response to the previous tennis elbow injection.    Review of Systems  Constitutional: Negative.   HENT: Negative.   Eyes: Negative.   Respiratory: Negative.   Cardiovascular: Negative.   Endocrine: Negative.   Musculoskeletal: Negative.   Neurological: Negative.   Hematological: Negative.   Psychiatric/Behavioral: Negative.   All other systems reviewed and are negative.    Objective: Vital Signs: LMP 08/17/2012   Physical Exam  Constitutional: She is oriented to person, place, and time. She appears well-developed and well-nourished.  HENT:  Head: Normocephalic and atraumatic.  Eyes: EOM are normal.  Neck: Neck supple.  Pulmonary/Chest: Effort normal.  Abdominal: Soft.  Neurological: She is alert and oriented to person, place, and time.  Skin: Skin is warm. Capillary refill takes less than 2 seconds.  Psychiatric: She has a normal mood and affect. Her behavior is normal. Judgment and thought content normal.  Nursing note and vitals reviewed.   Ortho Exam Right elbow exam is consistent with tennis elbow. She has lateral epicondylar tenderness. Right knee exam shows no joint effusion. She has a good range of motion. Medial joint line tenderness.  Specialty Comments:  No specialty comments available.  Imaging: Xr Knee 1-2 Views Right  Result Date: 02/27/2017 Advanced  degenerative joint disease    PMFS History: Patient Active Problem List   Diagnosis Date Noted  . Primary osteoarthritis of right knee 02/27/2017  . Lateral epicondylitis of right elbow 11/11/2016  . Muscle cramps 01/15/2016  . Bilateral leg edema 01/08/2016  . Radiculopathy 12/20/2015  . Hyperglycemia 12/21/2014  . Mastodynia, female 11/30/2014  . S/P excision of fibroadenoma of breast 11/30/2014  . Angioedema of lips 04/07/2014  . Fibroadenoma of right breast 03/07/2014  . Pelvic pain in female 06/19/2012  . Menorrhagia 06/11/2012    . SUI (stress urinary incontinence, female) 06/11/2012  . HTN (hypertension) 06/11/2012  . IBS (irritable bowel syndrome) - diarrhea predominant 03/17/2012  . MICROSCOPIC HEMATURIA 11/30/2010  . BREAST PAIN, RIGHT 11/30/2010  . BREAST MASS, RIGHT 01/12/2010  . HYPERCHOLESTEROLEMIA 12/07/2007  . CIGARETTE SMOKER 12/07/2007  . DEGENERATIVE JOINT DISEASE 12/07/2007  . Low back pain with sciatica 12/07/2007  . HEADACHE 12/07/2007  . Obesity 08/03/2007  . ANXIETY 08/03/2007   Past Medical History:  Diagnosis Date  . Anxiety   . Back pain with radiation   . Breast mass, right   . Complication of anesthesia 04/07/14  . DJD (degenerative joint disease)   . Groin abscess   . Headache(784.0)   . History of IBS   . Hypercholesterolemia   . IBS (irritable bowel syndrome)   . Lactose intolerance   . Mild hypertension   . Obesity   . Tobacco use disorder     Family History  Problem Relation Age of Onset  . Diabetes Father   . Diabetes Mother   . Prostate cancer Paternal Grandfather   . Breast cancer Maternal Aunt 46    Past Surgical History:  Procedure Laterality Date  . ABDOMINAL HYSTERECTOMY    . ANTERIOR CERVICAL DECOMP/DISCECTOMY FUSION N/A 12/20/2015   Procedure: ANTERIOR CERVICAL DECOMPRESSION FUSION CERVICAL 4-5, CERVICAL 5-6, CERVICAL 6-7 WITH INSTRUMENTATION AND ALLOGRAFT;  Surgeon: Phylliss Bob, MD;  Location: [facility];  Service: Orthopedics;  Laterality: N/A;  Anterior cervical decompression fusion, cervical 4-5, cervical 5-6, cervical 6-7 with instrumentation and allograft  . BACK SURGERY    . BILATERAL SALPINGECTOMY  09/03/2012   Procedure: BILATERAL SALPINGECTOMY;  Surgeon: [name], MD;  Location: [facility];  Service: Gynecology;  Laterality: Bilateral;  . BREAST BIOPSY Right 04/07/2014   Procedure: REMOVAL RIGHT BREAST MASS WITH WIRE LOCALIZATION;  Surgeon: [name], MD;  Location: [facility];  Service: General;  Laterality: Right;  . BREAST LUMPECTOMY     x2   . COLONOSCOPY W/ BIOPSIES  04/24/2012   per Dr. Carlean Purl, clear, repeat in 10 yrs   . disectomy    . ESOPHAGOGASTRODUODENOSCOPY    . FINGER SURGERY     Right index-excision of mass   . FOOT SURGERY Right    Bone Spurs  . LAPAROSCOPIC HYSTERECTOMY  09/03/2012   Procedure: HYSTERECTOMY TOTAL LAPAROSCOPIC;  Surgeon: [name], MD;  Location: [facility];  Service: Gynecology;  Laterality: N/A;  . LUMBAR [place]    . TUBAL LIGATION     Social History   Occupational History  . [name]    Social History Main Topics  . Smoking status: Former Smoker    Years: 33.00    Types: Cigarettes  . Smokeless tobacco: Never Used     Comment: quit March 2017  . Alcohol use No     Comment: occ  . Drug use: No  . Sexual activity: Yes    Birth control/ protection:  Surgical     Comment: tubal ligation

## 2017-03-17 ENCOUNTER — Ambulatory Visit: Payer: 59 | Admitting: Family Medicine

## 2017-03-18 ENCOUNTER — Ambulatory Visit (INDEPENDENT_AMBULATORY_CARE_PROVIDER_SITE_OTHER): Payer: Self-pay | Admitting: Orthopaedic Surgery

## 2017-03-20 DIAGNOSIS — H401132 Primary open-angle glaucoma, bilateral, moderate stage: Secondary | ICD-10-CM | POA: Diagnosis not present

## 2017-03-21 ENCOUNTER — Other Ambulatory Visit: Payer: Self-pay | Admitting: Gynecology

## 2017-03-21 DIAGNOSIS — Z1231 Encounter for screening mammogram for malignant neoplasm of breast: Secondary | ICD-10-CM

## 2017-03-26 ENCOUNTER — Telehealth (INDEPENDENT_AMBULATORY_CARE_PROVIDER_SITE_OTHER): Payer: Self-pay | Admitting: Orthopaedic Surgery

## 2017-03-26 NOTE — Telephone Encounter (Signed)
Faxed records 3/16/218 to present to [facility]

## 2017-04-10 ENCOUNTER — Ambulatory Visit (INDEPENDENT_AMBULATORY_CARE_PROVIDER_SITE_OTHER): Payer: Self-pay | Admitting: Specialist

## 2017-05-01 ENCOUNTER — Other Ambulatory Visit: Payer: Self-pay | Admitting: Family Medicine

## 2017-05-01 ENCOUNTER — Ambulatory Visit
Admission: RE | Admit: 2017-05-01 | Discharge: 2017-05-01 | Disposition: A | Discharge status: Home / Self Care | Payer: 59 | Source: Ambulatory Visit | Attending: Gynecology | Admitting: Gynecology

## 2017-05-01 DIAGNOSIS — Z1231 Encounter for screening mammogram for malignant neoplasm of breast: Secondary | ICD-10-CM | POA: Diagnosis not present

## 2017-05-16 ENCOUNTER — Other Ambulatory Visit: Payer: Self-pay

## 2017-05-16 MED ORDER — HYDROMORPHONE HCL 4 MG PO TABS: 4.0000 mg | ORAL_TABLET | Freq: Four times a day (QID) | ORAL | 0 refills | Status: DC | PRN

## 2017-05-16 NOTE — Telephone Encounter (Signed)
done

## 2017-05-16 NOTE — Telephone Encounter (Signed)
Pt requesting refill on Hydromorphone.

## 2017-05-16 NOTE — Telephone Encounter (Signed)
Script is ready for pick up here at front office and I spoke with pt.  

## 2017-05-20 NOTE — Telephone Encounter (Signed)
PT provided ID signed log and picked up script. cb

## 2017-05-26 ENCOUNTER — Ambulatory Visit: Payer: Self-pay | Admitting: Family Medicine

## 2017-06-06 ENCOUNTER — Ambulatory Visit (INDEPENDENT_AMBULATORY_CARE_PROVIDER_SITE_OTHER): Payer: 59 | Admitting: Specialist

## 2017-06-06 ENCOUNTER — Ambulatory Visit (INDEPENDENT_AMBULATORY_CARE_PROVIDER_SITE_OTHER): Payer: 59

## 2017-06-06 ENCOUNTER — Encounter (INDEPENDENT_AMBULATORY_CARE_PROVIDER_SITE_OTHER): Payer: Self-pay | Admitting: Specialist

## 2017-06-06 VITALS — BP 176/82 | HR 75 | Ht 64.0 in | Wt 260.0 lb

## 2017-06-06 DIAGNOSIS — M48062 Spinal stenosis, lumbar region with neurogenic claudication: Secondary | ICD-10-CM

## 2017-06-06 DIAGNOSIS — M545 Low back pain: Secondary | ICD-10-CM | POA: Diagnosis not present

## 2017-06-06 MED ORDER — IBUPROFEN 800 MG PO TABS: 800.0000 mg | ORAL_TABLET | Freq: Three times a day (TID) | ORAL | 3 refills | Status: DC | PRN

## 2017-06-06 NOTE — Patient Instructions (Addendum)
Avoid bending, stooping and avoid lifting weights greater than 10 lbs. Avoid prolong standing and walking. Avoid frequent bending and stooping  No lifting greater than 10 lbs. May use ice or moist heat for pain. Weight loss is of benefit. Handicap license is approved. MRI was previously recommended and apparently approved for obtaining, please call the MRI facility and reschedule the MRI.

## 2017-06-06 NOTE — Progress Notes (Signed)
Office Visit Note   [name]           Date of Birth: 04/22/1966           MRN: [id number] Visit Date: 06/06/2017              Requested by: [name], MD [place], [place] [address] PCP: [name], MD   Assessment & Plan: Visit Diagnoses:  1. Low back pain, unspecified back pain laterality, unspecified chronicity, with sciatica presence unspecified   2. Lumbar stenosis with neurogenic claudication     Plan: Avoid bending, stooping and avoid lifting weights greater than 10 lbs. Avoid prolong standing and walking. Avoid frequent bending and stooping  No lifting greater than 10 lbs. May use ice or moist heat for pain. Weight loss is of benefit. Handicap license is approved. MRI was previously recommended and apparently approved for obtaining, please call the MRI facility and reschedule the MRI.  Follow-Up Instructions: Return in about 6 weeks (around 07/18/2017).   Orders:  Orders Placed This Encounter  Procedures  . XR Lumb Spine Flex&Ext Only   Meds ordered this encounter  Medications  . ibuprofen (ADVIL,MOTRIN) 800 MG tablet    Sig: Take 1 tablet (800 mg total) by mouth every 8 (eight) hours as needed.    Dispense:  90 tablet    Refill:  3      Procedures: No procedures performed   Clinical Data: No additional findings.   Subjective: Chief Complaint  Patient presents with  . Lower Back - Pain    51 year old female with history of low back pain that has been present for years. She reports a work related injury that occurred last year and she underwent both right shoulder surgery by Dr. Tamera Punt and also underwent cervical fusion surgey on 3 discs with a 3 level fusion. She reports that she has recovered from her cervical spine surgery and was discharged. She has a history of lumbar surgery in 12/2007 by myself a left L1-2 microdiscectomy. She does not have a handicapped sticker.    Review of Systems    Constitutional: Negative.   HENT: Negative.   Eyes: Negative.   Respiratory: Negative.   Cardiovascular: Negative.   Gastrointestinal: Negative.   Endocrine: Negative.   Genitourinary: Negative.   Musculoskeletal: Negative.   Skin: Negative.   Allergic/Immunologic: Negative.   Neurological: Negative.   Hematological: Negative.   Psychiatric/Behavioral: Negative.      Objective: Vital Signs: BP (!) 176/82 (BP Location: Left Arm, Patient Position: Sitting)   Pulse 75   Ht 5\' 4"  (1.626 m)   Wt 260 lb (117.9 kg)   LMP 08/17/2012   BMI 44.63 kg/m   Physical Exam  Constitutional: She is oriented to person, place, and time. She appears well-developed and well-nourished.  HENT:  Head: Normocephalic and atraumatic.  Eyes: Pupils are equal, round, and reactive to light. EOM are normal.  Neck: Normal range of motion. Neck supple.  Pulmonary/Chest: Effort normal and breath sounds normal.  Abdominal: Soft. Bowel sounds are normal.  Neurological: She is alert and oriented to person, place, and time.  Skin: Skin is warm and dry.  Psychiatric: She has a normal mood and affect. Her behavior is normal. Judgment and thought content normal.    Back Exam   Tenderness  The patient is experiencing tenderness in the lumbar.  Range of Motion  Extension: abnormal  Flexion: normal  Lateral Bend Right: abnormal  Lateral Bend Left: abnormal  Rotation Right: abnormal  Rotation Left: abnormal   Muscle Strength  Right Quadriceps:  5/5  Left Quadriceps:  5/5  Right Hamstrings:  5/5  Left Hamstrings:  5/5   Tests  Straight leg raise right: negative Straight leg raise left: negative  Reflexes  Patellar: 0/4 Achilles: 0/4 Babinski's sign: normal   Other  Toe Walk: normal Heel Walk: normal Sensation: normal Gait: abnormal  Erythema: no back redness Scars: present      Specialty Comments:  No specialty comments available.  Imaging: No results found.   PMFS  History: Patient Active Problem List   Diagnosis Date Noted  . Primary osteoarthritis of right knee 02/27/2017  . Lateral epicondylitis of right elbow 11/11/2016  . Muscle cramps 01/15/2016  . Bilateral leg edema 01/08/2016  . Radiculopathy 12/20/2015  . Hyperglycemia 12/21/2014  . Mastodynia, female 11/30/2014  . S/P excision of fibroadenoma of breast 11/30/2014  . Angioedema of lips 04/07/2014  . Fibroadenoma of right breast 03/07/2014  . Pelvic pain in female 06/19/2012  . Menorrhagia 06/11/2012  . SUI (stress urinary incontinence, female) 06/11/2012  . HTN (hypertension) 06/11/2012  . IBS (irritable bowel syndrome) - diarrhea predominant 03/17/2012  . MICROSCOPIC HEMATURIA 11/30/2010  . BREAST PAIN, RIGHT 11/30/2010  . BREAST MASS, RIGHT 01/12/2010  . HYPERCHOLESTEROLEMIA 12/07/2007  . CIGARETTE SMOKER 12/07/2007  . DEGENERATIVE JOINT DISEASE 12/07/2007  . Low back pain with sciatica 12/07/2007  . HEADACHE 12/07/2007  . Obesity 08/03/2007  . ANXIETY 08/03/2007   Past Medical History:  Diagnosis Date  . Anxiety   . Back pain with radiation   . Breast mass, right   . Complication of anesthesia 04/07/14  . DJD (degenerative joint disease)   . Groin abscess   . Headache(784.0)   . History of IBS   . Hypercholesterolemia   . IBS (irritable bowel syndrome)   . Lactose intolerance   . Mild hypertension   . Obesity   . Tobacco use disorder     Family History  Problem Relation Age of Onset  . Diabetes Father   . Diabetes Mother   . Prostate cancer Paternal Grandfather   . Breast cancer Maternal Aunt 46    Past Surgical History:  Procedure Laterality Date  . ABDOMINAL HYSTERECTOMY    . ANTERIOR CERVICAL DECOMP/DISCECTOMY FUSION N/A 12/20/2015   Procedure: ANTERIOR CERVICAL DECOMPRESSION FUSION CERVICAL 4-5, CERVICAL 5-6, CERVICAL 6-7 WITH INSTRUMENTATION AND ALLOGRAFT;  Surgeon: Phylliss Bob, MD;  Location: [facility];  Service: Orthopedics;  Laterality: N/A;  Anterior  cervical decompression fusion, cervical 4-5, cervical 5-6, cervical 6-7 with instrumentation and allograft  . BACK SURGERY    . BILATERAL SALPINGECTOMY  09/03/2012   Procedure: BILATERAL SALPINGECTOMY;  Surgeon: [name], MD;  Location: [facility];  Service: Gynecology;  Laterality: Bilateral;  . BREAST BIOPSY Right 04/07/2014   Procedure: REMOVAL RIGHT BREAST MASS WITH WIRE LOCALIZATION;  Surgeon: [name], MD;  Location: [facility];  Service: General;  Laterality: Right;  . BREAST LUMPECTOMY     x2  . COLONOSCOPY W/ BIOPSIES  04/24/2012   per Dr. Carlean Purl, clear, repeat in 10 yrs   . disectomy    . ESOPHAGOGASTRODUODENOSCOPY    . FINGER SURGERY     Right index-excision of mass   . FOOT SURGERY Right    Bone Spurs  . LAPAROSCOPIC HYSTERECTOMY  09/03/2012   Procedure: HYSTERECTOMY TOTAL LAPAROSCOPIC;  Surgeon: [name], MD;  Location: [facility];  Service:  Gynecology;  Laterality: N/A;  . LUMBAR [place]    . TUBAL LIGATION     Social History   Occupational History  . [name]    Social History Main Topics  . Smoking status: Former Smoker    Years: 33.00    Types: Cigarettes  . Smokeless tobacco: Never Used     Comment: quit March 2017  . Alcohol use No     Comment: occ  . Drug use: No  . Sexual activity: Yes    Birth control/ protection: Surgical     Comment: tubal ligation

## 2017-06-10 ENCOUNTER — Telehealth (INDEPENDENT_AMBULATORY_CARE_PROVIDER_SITE_OTHER): Payer: Self-pay | Admitting: Radiology

## 2017-06-10 ENCOUNTER — Other Ambulatory Visit (INDEPENDENT_AMBULATORY_CARE_PROVIDER_SITE_OTHER): Payer: Self-pay | Admitting: Family

## 2017-06-10 DIAGNOSIS — M5441 Lumbago with sciatica, right side: Secondary | ICD-10-CM

## 2017-06-10 NOTE — Telephone Encounter (Signed)
Needs to fax order for [facility] [phone] OR [phone] attn Elmo Putt. For MRI Lumbar. Previously done in April, but was rescheduled. Ok to reschedule per Dr. Louanne Skye.

## 2017-06-11 DIAGNOSIS — H25813 Combined forms of age-related cataract, bilateral: Secondary | ICD-10-CM | POA: Diagnosis not present

## 2017-06-11 DIAGNOSIS — H401132 Primary open-angle glaucoma, bilateral, moderate stage: Secondary | ICD-10-CM | POA: Diagnosis not present

## 2017-06-11 NOTE — Telephone Encounter (Signed)
Order faxed this morning.

## 2017-06-13 ENCOUNTER — Ambulatory Visit (HOSPITAL_COMMUNITY)
Admission: RE | Admit: 2017-06-13 | Discharge: 2017-06-13 | Disposition: A | Discharge status: Home / Self Care | Payer: 59 | Source: Ambulatory Visit | Attending: Family | Admitting: Family

## 2017-06-13 ENCOUNTER — Other Ambulatory Visit (INDEPENDENT_AMBULATORY_CARE_PROVIDER_SITE_OTHER): Payer: Self-pay | Admitting: Family

## 2017-06-13 DIAGNOSIS — M48061 Spinal stenosis, lumbar region without neurogenic claudication: Secondary | ICD-10-CM | POA: Insufficient documentation

## 2017-06-13 DIAGNOSIS — M5441 Lumbago with sciatica, right side: Secondary | ICD-10-CM | POA: Diagnosis present

## 2017-06-13 DIAGNOSIS — M47816 Spondylosis without myelopathy or radiculopathy, lumbar region: Secondary | ICD-10-CM | POA: Insufficient documentation

## 2017-06-13 DIAGNOSIS — E882 Lipomatosis, not elsewhere classified: Secondary | ICD-10-CM | POA: Diagnosis not present

## 2017-06-13 DIAGNOSIS — M545 Low back pain: Secondary | ICD-10-CM | POA: Diagnosis not present

## 2017-06-30 ENCOUNTER — Telehealth: Payer: Self-pay | Admitting: Family Medicine

## 2017-06-30 MED ORDER — HYDROMORPHONE HCL 4 MG PO TABS: 4.0000 mg | ORAL_TABLET | Freq: Four times a day (QID) | ORAL | 0 refills | Status: DC | PRN

## 2017-06-30 NOTE — Telephone Encounter (Signed)
Done

## 2017-06-30 NOTE — Telephone Encounter (Signed)
° ° ° °  Pt request refill of the following: ° ° °Phamacy: °

## 2017-06-30 NOTE — Telephone Encounter (Signed)
° ° ° ° °  Pt request refill of the following:  HYDROmorphone (DILAUDID) 4 MG tablet   Phamacy:

## 2017-07-01 ENCOUNTER — Ambulatory Visit (INDEPENDENT_AMBULATORY_CARE_PROVIDER_SITE_OTHER): Payer: 59 | Admitting: Orthopaedic Surgery

## 2017-07-01 ENCOUNTER — Encounter (INDEPENDENT_AMBULATORY_CARE_PROVIDER_SITE_OTHER): Payer: Self-pay | Admitting: Orthopaedic Surgery

## 2017-07-01 DIAGNOSIS — M1711 Unilateral primary osteoarthritis, right knee: Secondary | ICD-10-CM

## 2017-07-01 MED ORDER — METHYLPREDNISOLONE ACETATE 40 MG/ML IJ SUSP
40.0000 mg | INTRAMUSCULAR | Status: AC | PRN
  Administered 2017-07-01: 40 mg via INTRA_ARTICULAR

## 2017-07-01 MED ORDER — BUPIVACAINE HCL 0.5 % IJ SOLN
2.0000 mL | INTRAMUSCULAR | Status: AC | PRN
  Administered 2017-07-01: 2 mL via INTRA_ARTICULAR

## 2017-07-01 MED ORDER — LIDOCAINE HCL 1 % IJ SOLN
2.0000 mL | INTRAMUSCULAR | Status: AC | PRN
  Administered 2017-07-01: 2 mL

## 2017-07-01 NOTE — Progress Notes (Signed)
Office Visit Note   [name]           Date of Birth: April 28, 1966           MRN: [id number] Visit Date: 07/01/2017              Requested by: [name], MD [place], [place] [address] PCP: [name], MD   Assessment & Plan: Visit Diagnoses:  1. Primary osteoarthritis of right knee     Plan: Patient has end-stage degenerative joint disease right knee. We repeated a cortisone injection today. Hopefully this will give her more relief than a month. She understands that she does need a knee replacement but she has currently not able to have one. Questions encouraged and answered. Follow-up as needed.  Follow-Up Instructions: Return if symptoms worsen or fail to improve.   Orders:  No orders of the defined types were placed in this encounter.  No orders of the defined types were placed in this encounter.     Procedures: Large Joint Inj Date/Time: 07/01/2017 1:35 PM Performed by: Leandrew Koyanagi Authorized by: Leandrew Koyanagi   Consent Given by:  Patient Timeout: prior to procedure the correct patient, procedure, and site was verified   Indications:  Pain Location:  Knee Site:  R knee Prep: patient was prepped and draped in usual sterile fashion   Needle Size:  22 G Approach:  Lateral Ultrasound Guidance: No   Fluoroscopic Guidance: No   Arthrogram: No   Medications:  2 mL bupivacaine 0.5 %; 40 mg methylPREDNISolone acetate 40 MG/ML; 2 mL lidocaine 1 % Patient tolerance:  Patient tolerated the procedure well with no immediate complications     Clinical Data: No additional findings.   Subjective: Chief Complaint  Patient presents with  . Right Knee - Pain    Patient is a 51 year old female comes in for follow-up of her right knee degenerative joint disease. Previous cortisone injection gave her one month of relief and she is requesting another one. Denies any numbness or tingling or any changes in medical  history.    Review of Systems   Objective: Vital Signs: LMP 08/17/2012   Physical Exam  Ortho Exam Right knee exam is stable. Specialty Comments:  No specialty comments available.  Imaging: No results found.   PMFS History: Patient Active Problem List   Diagnosis Date Noted  . Primary osteoarthritis of right knee 02/27/2017  . Lateral epicondylitis of right elbow 11/11/2016  . Muscle cramps 01/15/2016  . Bilateral leg edema 01/08/2016  . Radiculopathy 12/20/2015  . Hyperglycemia 12/21/2014  . Mastodynia, female 11/30/2014  . S/P excision of fibroadenoma of breast 11/30/2014  . Angioedema of lips 04/07/2014  . Fibroadenoma of right breast 03/07/2014  . Pelvic pain in female 06/19/2012  . Menorrhagia 06/11/2012  . SUI (stress urinary incontinence, female) 06/11/2012  . HTN (hypertension) 06/11/2012  . IBS (irritable bowel syndrome) - diarrhea predominant 03/17/2012  . MICROSCOPIC HEMATURIA 11/30/2010  . BREAST PAIN, RIGHT 11/30/2010  . BREAST MASS, RIGHT 01/12/2010  . HYPERCHOLESTEROLEMIA 12/07/2007  . CIGARETTE SMOKER 12/07/2007  . DEGENERATIVE JOINT DISEASE 12/07/2007  . Low back pain with sciatica 12/07/2007  . HEADACHE 12/07/2007  . Obesity 08/03/2007  . ANXIETY 08/03/2007   Past Medical History:  Diagnosis Date  . Anxiety   . Back pain with radiation   . Breast mass, right   . Complication of anesthesia 04/07/14  . DJD (degenerative joint disease)   .  Groin abscess   . Headache(784.0)   . History of IBS   . Hypercholesterolemia   . IBS (irritable bowel syndrome)   . Lactose intolerance   . Mild hypertension   . Obesity   . Tobacco use disorder     Family History  Problem Relation Age of Onset  . Diabetes Father   . Diabetes Mother   . Prostate cancer Paternal Grandfather   . Breast cancer Maternal Aunt 46    Past Surgical History:  Procedure Laterality Date  . ABDOMINAL HYSTERECTOMY    . ANTERIOR CERVICAL DECOMP/DISCECTOMY FUSION N/A  12/20/2015   Procedure: ANTERIOR CERVICAL DECOMPRESSION FUSION CERVICAL 4-5, CERVICAL 5-6, CERVICAL 6-7 WITH INSTRUMENTATION AND ALLOGRAFT;  Surgeon: Phylliss Bob, MD;  Location: [facility];  Service: Orthopedics;  Laterality: N/A;  Anterior cervical decompression fusion, cervical 4-5, cervical 5-6, cervical 6-7 with instrumentation and allograft  . BACK SURGERY    . BILATERAL SALPINGECTOMY  09/03/2012   Procedure: BILATERAL SALPINGECTOMY;  Surgeon: [name], MD;  Location: [facility];  Service: Gynecology;  Laterality: Bilateral;  . BREAST BIOPSY Right 04/07/2014   Procedure: REMOVAL RIGHT BREAST MASS WITH WIRE LOCALIZATION;  Surgeon: [name], MD;  Location: [facility];  Service: General;  Laterality: Right;  . BREAST LUMPECTOMY     x2  . COLONOSCOPY W/ BIOPSIES  04/24/2012   per Dr. Carlean Purl, clear, repeat in 10 yrs   . disectomy    . ESOPHAGOGASTRODUODENOSCOPY    . FINGER SURGERY     Right index-excision of mass   . FOOT SURGERY Right    Bone Spurs  . LAPAROSCOPIC HYSTERECTOMY  09/03/2012   Procedure: HYSTERECTOMY TOTAL LAPAROSCOPIC;  Surgeon: [name], MD;  Location: [facility];  Service: Gynecology;  Laterality: N/A;  . LUMBAR [place]    . TUBAL LIGATION     Social History   Occupational History  . [name]    Social History Main Topics  . Smoking status: Former Smoker    Years: 33.00    Types: Cigarettes  . Smokeless tobacco: Never Used     Comment: quit March 2017  . Alcohol use No     Comment: occ  . Drug use: No  . Sexual activity: Yes    Birth control/ protection: Surgical     Comment: tubal ligation

## 2017-07-01 NOTE — Telephone Encounter (Signed)
Script is ready for pick up here at front office, tried to reach pt and no answer, also letter was given.  

## 2017-07-03 NOTE — Telephone Encounter (Signed)
Pt is aware that Rx is ready for pickup  

## 2017-07-07 DIAGNOSIS — H401132 Primary open-angle glaucoma, bilateral, moderate stage: Secondary | ICD-10-CM | POA: Diagnosis not present

## 2017-07-07 DIAGNOSIS — H25813 Combined forms of age-related cataract, bilateral: Secondary | ICD-10-CM | POA: Diagnosis not present

## 2017-07-16 ENCOUNTER — Ambulatory Visit: Payer: Self-pay | Admitting: Family Medicine

## 2017-07-17 ENCOUNTER — Ambulatory Visit (INDEPENDENT_AMBULATORY_CARE_PROVIDER_SITE_OTHER): Payer: 59 | Admitting: Family Medicine

## 2017-07-17 ENCOUNTER — Encounter: Payer: Self-pay | Admitting: Family Medicine

## 2017-07-17 VITALS — HR 101 | Temp 98.2°F | Ht 64.0 in | Wt 279.0 lb

## 2017-07-17 DIAGNOSIS — R252 Cramp and spasm: Secondary | ICD-10-CM

## 2017-07-17 LAB — CBC WITH DIFFERENTIAL/PLATELET
BASOS ABS: 0.1 10*3/uL (ref 0.0–0.1)
Basophils Relative: 0.6 % (ref 0.0–3.0)
Eosinophils Absolute: 0.1 10*3/uL (ref 0.0–0.7)
Eosinophils Relative: 0.6 % (ref 0.0–5.0)
HCT: 42.5 % (ref 36.0–46.0)
Hemoglobin: 13.5 g/dL (ref 12.0–15.0)
LYMPHS ABS: 1.9 10*3/uL (ref 0.7–4.0)
Lymphocytes Relative: 16.5 % (ref 12.0–46.0)
MCHC: 31.7 g/dL (ref 30.0–36.0)
MCV: 98 fl (ref 78.0–100.0)
MONOS PCT: 7 % (ref 3.0–12.0)
Monocytes Absolute: 0.8 10*3/uL (ref 0.1–1.0)
NEUTROS PCT: 75.3 % (ref 43.0–77.0)
Neutro Abs: 8.7 10*3/uL — ABNORMAL HIGH (ref 1.4–7.7)
Platelets: 319 10*3/uL (ref 150.0–400.0)
RBC: 4.33 Mil/uL (ref 3.87–5.11)
RDW: 15.6 % — ABNORMAL HIGH (ref 11.5–15.5)
WBC: 11.5 10*3/uL — ABNORMAL HIGH (ref 4.0–10.5)

## 2017-07-17 LAB — BASIC METABOLIC PANEL
BUN: 14 mg/dL (ref 6–23)
CALCIUM: 9.3 mg/dL (ref 8.4–10.5)
CO2: 29 meq/L (ref 19–32)
CREATININE: 0.74 mg/dL (ref 0.40–1.20)
Chloride: 99 mEq/L (ref 96–112)
GFR: 106.05 mL/min (ref >60.00)
Glucose, Bld: 82 mg/dL (ref 70–99)
Potassium: 4.8 mEq/L (ref 3.5–5.1)
Sodium: 138 mEq/L (ref 135–145)

## 2017-07-17 LAB — HEPATIC FUNCTION PANEL
ALT: 28 U/L (ref 0–35)
AST: 38 U/L — ABNORMAL HIGH (ref 0–37)
Albumin: 4.1 g/dL (ref 3.5–5.2)
Alkaline Phosphatase: 97 U/L (ref 39–117)
BILIRUBIN TOTAL: 0.7 mg/dL (ref 0.2–1.2)
Bilirubin, Direct: 0.2 mg/dL (ref 0.0–0.3)
Total Protein: 7.1 g/dL (ref 6.0–8.3)

## 2017-07-17 LAB — MAGNESIUM: MAGNESIUM: 1.8 mg/dL (ref 1.5–2.5)

## 2017-07-17 LAB — TSH: TSH: 2.58 u[IU]/mL (ref 0.35–4.50)

## 2017-07-17 LAB — CK: Total CK: 88 U/L (ref 7–177)

## 2017-07-17 MED ORDER — CYCLOBENZAPRINE HCL 10 MG PO TABS: 10.0000 mg | ORAL_TABLET | Freq: Three times a day (TID) | ORAL | 2 refills | Status: DC | PRN

## 2017-07-17 NOTE — Progress Notes (Signed)
   Subjective:    Patient ID: AHMIYAH COIL, female    DOB: [birth year], 51 y.o.   MRN: [id number]  HPI Here for muscle cramps. These started about 3 months ago but they are getting worse. They can occur anywhere in the body, including the neck and face. They are painful and they last from 5 to 30 minutes at a time.    Review of Systems  Constitutional: Negative.   Respiratory: Negative.   Cardiovascular: Negative.   Musculoskeletal: Positive for myalgias.  Neurological: Negative.        Objective:   Physical Exam  Constitutional: She is oriented to person, place, and time. She appears well-developed and well-nourished.  Neck: No thyromegaly present.  Cardiovascular: Normal rate, regular rhythm, normal heart sounds and intact distal pulses.   Pulmonary/Chest: Effort normal and breath sounds normal. No respiratory distress. She has no wheezes. She has no rales.  Musculoskeletal: She exhibits no tenderness.  Lymphadenopathy:    She has no cervical adenopathy.  Neurological: She is alert and oriented to person, place, and time.          Assessment & Plan:  Muscle cramps. This could be a side effect of the Lipitor, so I advised her to stop taking this for the time being. She needs to drink plenty of fluids, especially water and Gatorade. Check labs today.  [name], MD

## 2017-07-21 ENCOUNTER — Encounter (INDEPENDENT_AMBULATORY_CARE_PROVIDER_SITE_OTHER): Payer: Self-pay | Admitting: Specialist

## 2017-07-21 ENCOUNTER — Ambulatory Visit (INDEPENDENT_AMBULATORY_CARE_PROVIDER_SITE_OTHER): Payer: 59 | Admitting: Specialist

## 2017-07-21 VITALS — BP 150/86 | HR 92 | Ht 64.0 in | Wt 260.0 lb

## 2017-07-21 DIAGNOSIS — M48062 Spinal stenosis, lumbar region with neurogenic claudication: Secondary | ICD-10-CM | POA: Diagnosis not present

## 2017-07-21 DIAGNOSIS — Z981 Arthrodesis status: Secondary | ICD-10-CM | POA: Diagnosis not present

## 2017-07-21 DIAGNOSIS — M5136 Other intervertebral disc degeneration, lumbar region: Secondary | ICD-10-CM

## 2017-07-21 MED ORDER — TRAMADOL HCL 50 MG PO TABS: 50.0000 mg | ORAL_TABLET | Freq: Four times a day (QID) | ORAL | 0 refills | Status: DC | PRN

## 2017-07-21 NOTE — Patient Instructions (Signed)
Plan: Avoid overhead lifting and overhead use of the arms. Do not lift greater than 5 lbs. Adjust head rest in vehicle to prevent hyperextension if rear ended. Take extra precautions to avoid falling, including use of a cane if you feel weak. Avoid frequent bending and stooping  No lifting greater than 10 lbs. May use ice or moist heat for pain. Weight loss is of benefit. Handicap license is approved.

## 2017-07-21 NOTE — Progress Notes (Signed)
Office Visit Note   [name]           Date of Birth: [birth year]           MRN: [id number] Visit Date: 07/21/2017              Requested by: [name], MD [place], [place] [id number] PCP: [name], MD   Assessment & Plan: Visit Diagnoses:  1. Degenerative disc disease, lumbar   2. Spinal stenosis of lumbar region with neurogenic claudication   3. Status post cervical spinal fusion     Plan: Avoid overhead lifting and overhead use of the arms. Do not lift greater than 5 lbs. Adjust head rest in vehicle to prevent hyperextension if rear ended. Take extra precautions to avoid falling, including use of a cane if you feel weak. Avoid frequent bending and stooping  No lifting greater than 10 lbs. May use ice or moist heat for pain. Weight loss is of benefit. Handicap license is approved.    Follow-Up Instructions: No Follow-up on file.   Orders:  No orders of the defined types were placed in this encounter.  No orders of the defined types were placed in this encounter.     Procedures: No procedures performed   Clinical Data: No additional findings.   Subjective: Chief Complaint  Patient presents with  . Lower Back - Follow-up    MRI Review--Lspine    51 year old female post lumbar laminectomy with discectomy L2-3, currently she is applying for ssdi due to persistent LBp with some radiation into the right leg. She underwent MRI with shows a DDD at L1-2 right sided foramenal narrowing. Much of her pain is right posterior thigh and consistent with sciatica. The MRI show epidural lipomatosis at L4-5 and L5-S1. No recurrent HNP.     Review of Systems  Constitutional: Negative.   HENT: Negative.  Negative for congestion, dental problem, drooling, ear discharge, ear pain, facial swelling, hearing loss, mouth sores, nosebleeds, postnasal drip, rhinorrhea, sinus pressure, sneezing, sore throat, tinnitus, trouble swallowing  and voice change.   Eyes: Positive for visual disturbance.  Respiratory: Positive for shortness of breath. Negative for chest tightness.   Cardiovascular: Negative.  Negative for chest pain, palpitations and leg swelling.  Gastrointestinal: Negative.  Negative for abdominal distention, abdominal pain and constipation.  Endocrine: Negative.  Negative for cold intolerance, heat intolerance and polydipsia.  Genitourinary: Negative.  Negative for difficulty urinating, dyspareunia, dysuria, enuresis, flank pain, frequency, hematuria, pelvic pain and urgency.  Musculoskeletal: Positive for arthralgias, back pain and gait problem. Negative for myalgias.  Skin: Positive for wound. Negative for color change, pallor and rash.  Allergic/Immunologic: Negative.   Neurological: Negative for dizziness, tremors, seizures, facial asymmetry, speech difficulty, weakness, light-headedness, numbness and headaches.  Hematological: Negative.  Does not bruise/bleed easily.  Psychiatric/Behavioral: Negative.  Negative for agitation, behavioral problems, confusion, decreased concentration, dysphoric mood, hallucinations, self-injury, sleep disturbance and suicidal ideas. The patient is not nervous/anxious and is not hyperactive.      Objective: Vital Signs: BP (!) 150/86 (BP Location: Left Arm, Patient Position: Sitting)   Pulse 92   Ht 5\' 4"  (1.626 m)   Wt 260 lb (117.9 kg)   LMP 08/17/2012   BMI 44.63 kg/m   Physical Exam  Constitutional: She is oriented to person, place, and time. She appears well-developed and well-nourished.  HENT:  Head: Normocephalic and atraumatic.  Eyes: EOM are normal. Pupils are equal, round,  and reactive to light.  Neck: Normal range of motion. Neck supple.  Pulmonary/Chest: Effort normal and breath sounds normal.  Abdominal: Soft. Bowel sounds are normal.  Neurological: She is alert and oriented to person, place, and time.  Skin: Skin is warm and dry.  Psychiatric: She has a  normal mood and affect. Her behavior is normal. Judgment and thought content normal.    Back Exam   Tenderness  The patient is experiencing tenderness in the lumbar.  Range of Motion  Extension: abnormal  Flexion: normal  Lateral bend right: abnormal  Lateral bend left: abnormal  Rotation right: abnormal  Rotation left: abnormal   Muscle Strength  Right Quadriceps:  5/5  Left Quadriceps:  5/5  Right Hamstrings:  5/5  Left Hamstrings:  5/5   Tests  Straight leg raise right: negative Straight leg raise left: negative  Reflexes  Babinski's sign: normal   Other  Toe walk: normal Heel walk: normal Sensation: normal  Comments:  No motor deficit.      Specialty Comments:  No specialty comments available.  Imaging: No results found.   PMFS History: Patient Active Problem List   Diagnosis Date Noted  . Primary osteoarthritis of right knee 02/27/2017  . Lateral epicondylitis of right elbow 11/11/2016  . Muscle cramps 01/15/2016  . Bilateral leg edema 01/08/2016  . Radiculopathy 12/20/2015  . Hyperglycemia 12/21/2014  . Mastodynia, female 11/30/2014  . S/P excision of fibroadenoma of breast 11/30/2014  . Angioedema of lips 04/07/2014  . Fibroadenoma of right breast 03/07/2014  . Pelvic pain in female 06/19/2012  . Menorrhagia 06/11/2012  . SUI (stress urinary incontinence, female) 06/11/2012  . HTN (hypertension) 06/11/2012  . IBS (irritable bowel syndrome) - diarrhea predominant 03/17/2012  . MICROSCOPIC HEMATURIA 11/30/2010  . BREAST PAIN, RIGHT 11/30/2010  . BREAST MASS, RIGHT 01/12/2010  . HYPERCHOLESTEROLEMIA 12/07/2007  . CIGARETTE SMOKER 12/07/2007  . DEGENERATIVE JOINT DISEASE 12/07/2007  . Low back pain with sciatica 12/07/2007  . HEADACHE 12/07/2007  . Obesity 08/03/2007  . ANXIETY 08/03/2007   Past Medical History:  Diagnosis Date  . Anxiety   . Back pain with radiation   . Breast mass, right   . Complication of anesthesia 04/07/14  .  DJD (degenerative joint disease)   . Groin abscess   . Headache(784.0)   . History of IBS   . Hypercholesterolemia   . IBS (irritable bowel syndrome)   . Lactose intolerance   . Mild hypertension   . Obesity   . Tobacco use disorder     Family History  Problem Relation Age of Onset  . Diabetes Father   . Diabetes Mother   . Prostate cancer Paternal Grandfather   . Breast cancer Maternal Aunt 46    Past Surgical History:  Procedure Laterality Date  . ABDOMINAL HYSTERECTOMY    . BACK SURGERY    . BREAST LUMPECTOMY     x2  . COLONOSCOPY W/ BIOPSIES  04/24/2012   per Dr. Carlean Purl, clear, repeat in 10 yrs   . disectomy    . ESOPHAGOGASTRODUODENOSCOPY    . FINGER SURGERY     Right index-excision of mass   . FOOT SURGERY Right    Bone Spurs  . LUMBAR DISC SURGERY    . TUBAL LIGATION     Social History   Occupational History  . Occupation: Optometrist  Tobacco Use  . Smoking status: Former Smoker    Years: 33.00    Types: Cigarettes  . Smokeless  tobacco: Never Used  . Tobacco comment: quit March 2017  Substance and Sexual Activity  . Alcohol use: No    Alcohol/week: 0.0 oz    Comment: occ  . Drug use: No  . Sexual activity: Yes    Birth control/protection: Surgical    Comment: tubal ligation

## 2017-07-28 ENCOUNTER — Ambulatory Visit (INDEPENDENT_AMBULATORY_CARE_PROVIDER_SITE_OTHER): Payer: 59 | Admitting: Family Medicine

## 2017-07-28 ENCOUNTER — Encounter: Payer: Self-pay | Admitting: Family Medicine

## 2017-07-28 VITALS — Temp 98.6°F | Ht 64.0 in | Wt 284.0 lb

## 2017-07-28 DIAGNOSIS — M3320 Polymyositis, organ involvement unspecified: Secondary | ICD-10-CM | POA: Diagnosis not present

## 2017-07-28 DIAGNOSIS — R252 Cramp and spasm: Secondary | ICD-10-CM | POA: Diagnosis not present

## 2017-07-28 NOTE — Patient Instructions (Signed)
WE NOW OFFER   [facility] FAST TRACK!!!  SAME DAY Appointments for ACUTE CARE  Such as: Sprains, Injuries, cuts, abrasions, rashes, muscle pain, joint pain, back pain Colds, flu, sore throats, headache, allergies, cough, fever  Ear pain, sinus and eye infections Abdominal pain, nausea, vomiting, diarrhea, upset stomach Animal/insect bites  3 Easy Ways to Schedule: Walk-In Scheduling Call in scheduling Mychart Sign-up: [email or web address]         

## 2017-07-28 NOTE — Progress Notes (Signed)
   Subjective:    Patient ID: Dana [name], female    DOB: 13-May-1966, 51 y.o.   MRN: [id number]  HPI Here to follow up on muscle cramps. These started about 6 weeks ago and they are very uncomfortable. I suggested she temporarily hold her Lipitor and she has done so, yet they persist. We drew labs last time which were all normal, including electrolytes TSH and CK. She had labs in February with Dr. Carlean Purl including a sed rate and a CRP, which were both normal.    Review of Systems  Constitutional: Negative.   Respiratory: Negative.   Cardiovascular: Negative.   Musculoskeletal: Positive for back pain and myalgias.  Neurological: Negative.        Objective:   Physical Exam  Constitutional: She is oriented to person, place, and time. She appears well-developed and well-nourished.  Cardiovascular: Normal rate, regular rhythm, normal heart sounds and intact distal pulses.  Pulmonary/Chest: Effort normal and breath sounds normal. No respiratory distress. She has no wheezes. She has no rales.  Neurological: She is alert and oriented to person, place, and time.          Assessment & Plan:  Diffuse cramps of uncertain etiology. She will stay off Lipitor for another week or two to see if this makes a difference. She will start taking OTC magnesium 2 tabs daily. We will refer her to Rheumatology to investigate further.  [name], MD

## 2017-07-30 ENCOUNTER — Encounter (INDEPENDENT_AMBULATORY_CARE_PROVIDER_SITE_OTHER): Payer: Self-pay | Admitting: Physical Medicine and Rehabilitation

## 2017-07-30 ENCOUNTER — Ambulatory Visit (INDEPENDENT_AMBULATORY_CARE_PROVIDER_SITE_OTHER): Payer: 59 | Admitting: Physical Medicine and Rehabilitation

## 2017-07-30 ENCOUNTER — Ambulatory Visit (INDEPENDENT_AMBULATORY_CARE_PROVIDER_SITE_OTHER): Payer: 59

## 2017-07-30 VITALS — BP 149/85 | HR 90

## 2017-07-30 DIAGNOSIS — M5416 Radiculopathy, lumbar region: Secondary | ICD-10-CM

## 2017-07-30 MED ORDER — BETAMETHASONE SOD PHOS & ACET 6 (3-3) MG/ML IJ SUSP
12.0000 mg | Freq: Once | INTRAMUSCULAR | Status: AC
  Administered 2017-07-30: 12 mg

## 2017-07-30 MED ORDER — LIDOCAINE HCL (PF) 1 % IJ SOLN
2.0000 mL | Freq: Once | INTRAMUSCULAR | Status: AC
  Administered 2017-07-30: 2 mL

## 2017-07-30 NOTE — Progress Notes (Deleted)
Patient comes in today for injection. Dr Louanne Skye made referral. Pain level (6/10) -constant pain. Has Tingling, numbness, burning. When sitting pain decreases. Worse when moving and WB. Takes Tylenol ES for pain. Doesn't really help. Takes Flexeril for muscle spasms.

## 2017-07-30 NOTE — Patient Instructions (Signed)

## 2017-07-30 NOTE — Procedures (Signed)
Mrs. Dana [name] is a 51 year old female prior laminectomy discectomy at L2-3.  Dr. Louanne Skye who follows her spine request diagnostic and hopefully therapeutic L2 transforaminal injection on the right.  Patient's last MRI does show facet arthropathy proptosis at L4-5 with significant stenosis.  Lumbosacral Transforaminal Epidural Steroid Injection - Sub-Pedicular Approach with Fluoroscopic Guidance  [name]      Date of Birth: March 20, 1966 MRN: [id number] PCP: [name], MD      Visit Date: 07/30/2017   Universal Protocol:    Date/Time: 07/30/2017  Consent Given By: the patient  Position: PRONE  Additional Comments: Vital signs were monitored before and after the procedure. Patient was prepped and draped in the usual sterile fashion. The correct patient, procedure, and site was verified.   Injection Procedure Details:  Procedure Site One Meds Administered:  Meds ordered this encounter  Medications  . lidocaine (PF) (XYLOCAINE) 1 % injection 2 mL  . betamethasone acetate-betamethasone sodium phosphate (CELESTONE) injection 12 mg    Laterality: Right  Location/Site:  L2-L3  Needle size: 22 G  Needle type: Spinal  Needle Placement: Transforaminal  Findings:  -Contrast Used: 0.5 mL iohexol 180 mg iodine/mL   -Comments: Excellent flow of contrast along the nerve and into the epidural space.  Procedure Details: After squaring off the end-plates to get a true AP view, the C-arm was positioned so that an oblique view of the foramen as noted above was visualized. The target area is just inferior to the "nose of the scotty dog" or sub pedicular. The soft tissues overlying this structure were infiltrated with 2-3 ml. of 1% Lidocaine without Epinephrine.  The spinal needle was inserted toward the target using a "trajectory" view along the fluoroscope beam.  Under AP and lateral visualization, the needle was advanced so it did not puncture dura and was located close  the 6 O'Clock position of the pedical in AP tracterory. Biplanar projections were used to confirm position. Aspiration was confirmed to be negative for CSF and/or blood. A 1-2 ml. volume of Isovue-250 was injected and flow of contrast was noted at each level. Radiographs were obtained for documentation purposes.   After attaining the desired flow of contrast documented above, a 0.5 to 1.0 ml test dose of 0.25% Marcaine was injected into each respective transforaminal space.  The patient was observed for 90 seconds post injection.  After no sensory deficits were reported, and normal lower extremity motor function was noted,   the above injectate was administered so that equal amounts of the injectate were placed at each foramen (level) into the transforaminal epidural space.   Additional Comments:  The patient tolerated the procedure well Dressing: Band-Aid    Post-procedure details: Patient was observed during the procedure. Post-procedure instructions were reviewed.  Patient left the clinic in stable condition.

## 2017-08-05 ENCOUNTER — Telehealth: Payer: Self-pay | Admitting: Family Medicine

## 2017-08-05 DIAGNOSIS — M791 Myalgia, unspecified site: Secondary | ICD-10-CM

## 2017-08-05 NOTE — Telephone Encounter (Signed)
Copied from [place] [phone]. Topic: Referral - Request >> Aug 05, 2017  9:38 AM Scherrie Gerlach wrote: Reason for CRM: pt states [facility] declined to see her due to "muscle cramps"  .  Pt states they were rued. pt would like referral to someone else

## 2017-08-05 NOTE — Telephone Encounter (Signed)
The second referral was done

## 2017-08-05 NOTE — Telephone Encounter (Signed)
Pt requesting new referral to another Rheum provider.

## 2017-08-05 NOTE — Telephone Encounter (Signed)
Copied from [place] [phone]. Topic: Referral - Request >> Aug 05, 2017  9:38 AM Scherrie Gerlach wrote: Reason for CRM: pt states [facility] declined to see her due to "muscle cramps"  .  Pt states they were rued. pt would like referral to someone else

## 2017-08-05 NOTE — Telephone Encounter (Signed)
Pt advised and voiced understanding.   

## 2017-08-05 NOTE — Telephone Encounter (Signed)
Sent to PCP as an FYI pt will need another referral placed.

## 2017-08-11 ENCOUNTER — Telehealth: Payer: Self-pay | Admitting: Family Medicine

## 2017-08-11 MED ORDER — HYDROMORPHONE HCL 4 MG PO TABS: 4.0000 mg | ORAL_TABLET | Freq: Four times a day (QID) | ORAL | 0 refills | Status: DC | PRN

## 2017-08-11 NOTE — Telephone Encounter (Signed)
Pt advised and voiced understanding. Rx placed up front for pick up. Pt stated that she has NOT heard back about her 2nd referral that was placed. Spoke to Hilda Blades the lady in charge of our referrals. She stated that her insurance will not cover the referral due to the reason for being seen. Spoke to Dr. Sarajane Jews he suggested for the pt to reach out to her insurance and ask why they will not cover the referral because of muscle cramps. Find out the reason and contact us or even ask her insurance to fax over the reason for Dr.Fry to review. Spoke to pt she was advised and plans to reach out to her pharmacy.

## 2017-08-11 NOTE — Telephone Encounter (Signed)
Done

## 2017-08-11 NOTE — Addendum Note (Signed)
Addended by: [name] A on: 08/11/2017 05:33 PM   Modules accepted: Orders

## 2017-08-11 NOTE — Telephone Encounter (Signed)
Copied from [place]. Topic: Quick Communication - See Telephone Encounter >> Aug 11, 2017 11:20 AM Carolyn Stare wrote: CRM for notification. See Telephone encounter for:  refill    HYDROmorphone (DILAUDID) 4 MG tablet  08/11/17.

## 2017-08-11 NOTE — Telephone Encounter (Signed)
This was taken care of

## 2017-08-11 NOTE — Telephone Encounter (Signed)
Sent to PCP for approval. Rx was last refilled 06/30/2017 disp 120, with no refills. Last OV 07/28/2017.

## 2017-08-21 ENCOUNTER — Encounter (INDEPENDENT_AMBULATORY_CARE_PROVIDER_SITE_OTHER): Payer: Self-pay | Admitting: Surgery

## 2017-08-21 ENCOUNTER — Ambulatory Visit (INDEPENDENT_AMBULATORY_CARE_PROVIDER_SITE_OTHER): Payer: 59 | Admitting: Surgery

## 2017-08-21 VITALS — BP 169/97 | HR 104 | Ht 64.0 in | Wt 260.0 lb

## 2017-08-21 DIAGNOSIS — M533 Sacrococcygeal disorders, not elsewhere classified: Secondary | ICD-10-CM | POA: Diagnosis not present

## 2017-08-21 DIAGNOSIS — M7061 Trochanteric bursitis, right hip: Secondary | ICD-10-CM

## 2017-08-21 DIAGNOSIS — G8929 Other chronic pain: Secondary | ICD-10-CM | POA: Diagnosis not present

## 2017-08-21 DIAGNOSIS — M5416 Radiculopathy, lumbar region: Secondary | ICD-10-CM

## 2017-08-21 NOTE — Progress Notes (Signed)
Office Visit Note   [name]           Date of Birth: 1966-06-19           MRN: [id number] Visit Date: 08/21/2017              Requested by: [name], MD [place], [place] [id number] PCP: [name], MD   Assessment & Plan: Visit Diagnoses:  1. Radiculopathy, lumbar region     Plan: Since patient did not have good response with previous right L2-3 injection I will schedule bilateral L4-5 facet injections. Advised patient to pay close attention to how she feels immediately after the injection. Patient also complaining of bilateral SI joint pain and also right hip greater trochanteric bursitis. We may consider trying injections there depending upon her response with the L4-5 facets.  Follow-Up Instructions: Return in about 2 weeks (around 09/04/2017) for recheck after facet injections.   Orders:  Orders Placed This Encounter  Procedures  . Ambulatory referral to Physical Medicine Rehab   No orders of the defined types were placed in this encounter.     Procedures: No procedures performed   Clinical Data: No additional findings.   Subjective: Chief Complaint  Patient presents with  . Lower Back - Follow-up    HPI Patient returns after having ESI. Patient states that she had minimal improvement with the injection. Continues to have Central low back pain and right buttock pain. Previous lumbar spine MRI showed L2-3 small broad-based disc bulge asymmetric to the right, mild facet arthropathy with ligamentum flavum redundancy. L4-5 small broad-based central disc protrusion. Moderate facet arthropathy and ligamentum flavum redundancy with trace facet effusions which are likely reactive. No canal stenosis though, coupled dural lipomatosis neurosurgical thecal sac to 4 mm in AP dimension. Review of Systems No current cardiac pulmonary GI GU issues  Objective: Vital Signs: BP (!) 169/97 (BP Location: Left Arm, Patient Position:  Sitting)   Pulse (!) 104   Ht 5\' 4"  (1.626 m)   Wt 260 lb (117.9 kg)   LMP 08/17/2012   BMI 44.63 kg/m   Physical Exam  Constitutional: She is oriented to person, place, and time. She appears well-developed.  HENT:  Head: Normocephalic and atraumatic.  Eyes: Pupils are equal, round, and reactive to light.  Neck: Normal range of motion.  Pulmonary/Chest: No respiratory distress.  Musculoskeletal:  Lumbar paraspinal tenderness. Positive sciatic notch tenderness. Negative logroll. Negative straight leg raise. Pain with lumbar flexion and extension. Marked tenderness over the right greater trochanter bursa.  Neurological: She is alert and oriented to person, place, and time.  Skin: Skin is warm.  Psychiatric: She has a normal mood and affect.    Ortho Exam  Specialty Comments:  No specialty comments available.  Imaging: No results found.   PMFS History: Patient Active Problem List   Diagnosis Date Noted  . Primary osteoarthritis of right knee 02/27/2017  . Lateral epicondylitis of right elbow 11/11/2016  . Muscle cramps 01/15/2016  . Bilateral leg edema 01/08/2016  . Radiculopathy 12/20/2015  . Hyperglycemia 12/21/2014  . Mastodynia, female 11/30/2014  . S/P excision of fibroadenoma of breast 11/30/2014  . Angioedema of lips 04/07/2014  . Fibroadenoma of right breast 03/07/2014  . Pelvic pain in female 06/19/2012  . Menorrhagia 06/11/2012  . SUI (stress urinary incontinence, female) 06/11/2012  . HTN (hypertension) 06/11/2012  . IBS (irritable bowel syndrome) - diarrhea predominant 03/17/2012  . MICROSCOPIC HEMATURIA 11/30/2010  .  BREAST PAIN, RIGHT 11/30/2010  . BREAST MASS, RIGHT 01/12/2010  . HYPERCHOLESTEROLEMIA 12/07/2007  . CIGARETTE SMOKER 12/07/2007  . DEGENERATIVE JOINT DISEASE 12/07/2007  . Low back pain with sciatica 12/07/2007  . HEADACHE 12/07/2007  . Obesity 08/03/2007  . ANXIETY 08/03/2007   Past Medical History:  Diagnosis Date  . Anxiety   .  Back pain with radiation   . Breast mass, right   . Complication of anesthesia 04/07/14  . DJD (degenerative joint disease)   . Groin abscess   . Headache(784.0)   . History of IBS   . Hypercholesterolemia   . IBS (irritable bowel syndrome)   . Lactose intolerance   . Mild hypertension   . Obesity   . Tobacco use disorder     Family History  Problem Relation Age of Onset  . Diabetes Father   . Diabetes Mother   . Prostate cancer Paternal Grandfather   . Breast cancer Maternal Aunt 46    Past Surgical History:  Procedure Laterality Date  . ABDOMINAL HYSTERECTOMY    . ANTERIOR CERVICAL DECOMP/DISCECTOMY FUSION N/A 12/20/2015   Procedure: ANTERIOR CERVICAL DECOMPRESSION FUSION CERVICAL 4-5, CERVICAL 5-6, CERVICAL 6-7 WITH INSTRUMENTATION AND ALLOGRAFT;  Surgeon: Phylliss Bob, MD;  Location: [facility];  Service: Orthopedics;  Laterality: N/A;  Anterior cervical decompression fusion, cervical 4-5, cervical 5-6, cervical 6-7 with instrumentation and allograft  . BACK SURGERY    . BILATERAL SALPINGECTOMY  09/03/2012   Procedure: BILATERAL SALPINGECTOMY;  Surgeon: [name], MD;  Location: [facility];  Service: Gynecology;  Laterality: Bilateral;  . BREAST BIOPSY Right 04/07/2014   Procedure: REMOVAL RIGHT BREAST MASS WITH WIRE LOCALIZATION;  Surgeon: [name], MD;  Location: [facility];  Service: General;  Laterality: Right;  . BREAST LUMPECTOMY     x2  . COLONOSCOPY W/ BIOPSIES  04/24/2012   per Dr. Carlean Purl, clear, repeat in 10 yrs   . disectomy    . ESOPHAGOGASTRODUODENOSCOPY    . FINGER SURGERY     Right index-excision of mass   . FOOT SURGERY Right    Bone Spurs  . LAPAROSCOPIC HYSTERECTOMY  09/03/2012   Procedure: HYSTERECTOMY TOTAL LAPAROSCOPIC;  Surgeon: [name], MD;  Location: [facility];  Service: Gynecology;  Laterality: N/A;  . LUMBAR [place]    . TUBAL LIGATION     Social History   Occupational History  . Occupation: Optometrist  Tobacco Use  .  Smoking status: Former Smoker    Years: 33.00    Types: Cigarettes  . Smokeless tobacco: Never Used  . Tobacco comment: quit March 2017  Substance and Sexual Activity  . Alcohol use: No    Alcohol/week: 0.0 oz    Comment: occ  . Drug use: No  . Sexual activity: Yes    Birth control/protection: Surgical    Comment: tubal ligation

## 2017-08-28 ENCOUNTER — Encounter (INDEPENDENT_AMBULATORY_CARE_PROVIDER_SITE_OTHER): Payer: Self-pay | Admitting: Surgery

## 2017-08-28 ENCOUNTER — Telehealth: Payer: Self-pay | Admitting: Family Medicine

## 2017-08-28 NOTE — Telephone Encounter (Signed)
Copied from [place]. Topic: Quick Communication - Rx Refill/Question >> Aug 28, 2017  8:36 AM Scherrie Gerlach wrote: Pt states she has itchy sore throat, head stopped up, feels bad, right ear hurts, and wants to know if Dr Sarajane Jews will call her in an abx.  Pt declined to make an appt, stating she doesn't feel lik getting out of bed. Pt states she feels like she has the "flu".  [id number] [address], [place] [phone] (Phone) [phone] (Fax)

## 2017-08-28 NOTE — Telephone Encounter (Signed)
Pt declined to make appt. Pt wants to know if abx could be called in, stating she feels like she has the flu.

## 2017-08-29 ENCOUNTER — Ambulatory Visit (INDEPENDENT_AMBULATORY_CARE_PROVIDER_SITE_OTHER): Payer: 59 | Admitting: Family Medicine

## 2017-08-29 ENCOUNTER — Encounter: Payer: Self-pay | Admitting: Family Medicine

## 2017-08-29 VITALS — BP 138/100 | HR 114 | Temp 98.6°F | Wt 287.4 lb

## 2017-08-29 DIAGNOSIS — M791 Myalgia, unspecified site: Secondary | ICD-10-CM | POA: Diagnosis not present

## 2017-08-29 DIAGNOSIS — J209 Acute bronchitis, unspecified: Secondary | ICD-10-CM | POA: Diagnosis not present

## 2017-08-29 MED ORDER — LEVOFLOXACIN 500 MG PO TABS: 500.0000 mg | ORAL_TABLET | Freq: Every day | ORAL | 0 refills | Status: AC

## 2017-08-29 MED ORDER — BENZONATATE 200 MG PO CAPS: 200.0000 mg | ORAL_CAPSULE | Freq: Two times a day (BID) | ORAL | 0 refills | Status: DC | PRN

## 2017-08-29 NOTE — Telephone Encounter (Signed)
Ok to close patient is coming in today for an OV.

## 2017-08-29 NOTE — Progress Notes (Signed)
   Subjective:    Patient ID: Dana [name], female    DOB: 06/19/1966, 51 y.o.   MRN: [id number]  HPI Here for 4 days of stuffy head, PND, chest congestion and a dry cough.    Review of Systems  Constitutional: Negative.   HENT: Positive for congestion, sinus pressure, sinus pain and sore throat. Negative for postnasal drip.   Eyes: Negative.   Respiratory: Positive for cough and chest tightness.        Objective:   Physical Exam  Constitutional: She appears well-developed and well-nourished.  HENT:  Right Ear: External ear normal.  Left Ear: External ear normal.  Nose: Nose normal.  Mouth/Throat: Oropharynx is clear and moist.  Eyes: Conjunctivae are normal.  Neck: No thyromegaly present.  Pulmonary/Chest: Effort normal. She has no wheezes. She has no rales.  Scattered rhonchi   Lymphadenopathy:    She has no cervical adenopathy.          Assessment & Plan:  Bronchitis, treat with Levaquin.  [name], MD

## 2017-09-01 DIAGNOSIS — H401132 Primary open-angle glaucoma, bilateral, moderate stage: Secondary | ICD-10-CM | POA: Diagnosis not present

## 2017-09-04 ENCOUNTER — Encounter (INDEPENDENT_AMBULATORY_CARE_PROVIDER_SITE_OTHER): Payer: Self-pay | Admitting: Physical Medicine and Rehabilitation

## 2017-09-04 ENCOUNTER — Ambulatory Visit (INDEPENDENT_AMBULATORY_CARE_PROVIDER_SITE_OTHER): Payer: 59 | Admitting: Physical Medicine and Rehabilitation

## 2017-09-04 ENCOUNTER — Ambulatory Visit (INDEPENDENT_AMBULATORY_CARE_PROVIDER_SITE_OTHER): Payer: 59

## 2017-09-04 VITALS — BP 156/105 | HR 99 | Temp 98.0°F

## 2017-09-04 DIAGNOSIS — M47816 Spondylosis without myelopathy or radiculopathy, lumbar region: Secondary | ICD-10-CM

## 2017-09-04 MED ORDER — METHYLPREDNISOLONE ACETATE 80 MG/ML IJ SUSP
80.0000 mg | Freq: Once | INTRAMUSCULAR | Status: AC
  Administered 2017-09-04: 80 mg

## 2017-09-04 NOTE — Progress Notes (Deleted)
Pt states pain, numbness, and weakness in lower back. Pain increases when pt is walking and standing. Pt states pain is a burning sensation. +Driver (son), -Dye Allergy.

## 2017-09-04 NOTE — Procedures (Signed)
Dana [name] is a 51 year old female followed by Benjiman Core and Dr. Louanne Skye.  She has had prior laminectomy 2-3 region.  Prior L2 injections have not been beneficial.  It does show L4-5 facet arthropathy.  There is epidural lipomatosis with some crowding of the 4 mm diameter AP dimension would likely indicate there is stenosis based on epidural lipomatosis.  The patient is having mostly low back pain however some referral in the buttock.  No radicular pain.  Lumbar Facet Joint Intra-Articular Injection(s) with Fluoroscopic Guidance  [name]      Date of Birth: 1966/01/15 MRN: [id number] PCP: [name], MD      Visit Date: 09/04/2017   Universal Protocol:    Date/Time: 09/04/2017  Consent Given By: the patient  Position: PRONE   Additional Comments: Vital signs were monitored before and after the procedure. Patient was prepped and draped in the usual sterile fashion. The correct patient, procedure, and site was verified.   Injection Procedure Details:  Procedure Site One Meds Administered:  Meds ordered this encounter  Medications  . methylPREDNISolone acetate (DEPO-MEDROL) injection 80 mg     Laterality: Bilateral  Location/Site:  L4-L5  Needle size: 22 guage  Needle type: Spinal  Needle Placement: Articular  Findings:  -Comments: Excellent flow of contrast producing a partial arthrogram.  Procedure Details: The fluoroscope beam is vertically oriented in AP, and the inferior recess is visualized beneath the lower pole of the inferior apophyseal process, which represents the target point for needle insertion. When direct visualization is difficult the target point is located at the medial projection of the vertebral pedicle. The region overlying each aforementioned target is locally anesthetized with a 1 to 2 ml. volume of 1% Lidocaine without Epinephrine.   The spinal needle was inserted into each of the above mentioned facet joints using biplanar  fluoroscopic guidance. A 0.25 to 0.5 ml. volume of Isovue-250 was injected and a partial facet joint arthrogram was obtained. A single spot film was obtained of the resulting arthrogram.    One to 1.25 ml of the steroid/anesthetic solution was then injected into each of the facet joints noted above.   Additional Comments:  The patient tolerated the procedure well Dressing: Band-Aid    Post-procedure details: Patient was observed during the procedure. Post-procedure instructions were reviewed.  Patient left the clinic in stable condition.  Pertinent Imaging: MRI LUMBAR SPINE WITHOUT CONTRAST  TECHNIQUE: Multiplanar, multisequence MR imaging of the lumbar spine was performed. No intravenous contrast was administered.  COMPARISON:  Lumbar spine radiographs April 05, 2017 MRI of lumbar spine December 09, 2007  FINDINGS: Large body habitus decreases overall signal to noise ratio.  SEGMENTATION: For the purposes of this report, the last well-formed intervertebral disc will be reported as L5-S1.  ALIGNMENT: Maintained lumbar lordosis. No malalignment.  VERTEBRAE:Vertebral bodies are intact. Severe L1 scratches severe L2-3 disc height loss progressed from prior examination with mild subacute on chronic discogenic endplate changes. Remaining discs demonstrate normal morphology with mild desiccation. No suspicious or acute bone marrow signal in the low lumbar spine. Mild subacute on chronic discogenic endplate changes included thoracic spine. Congenital canal narrowing on the basis of foreshortened pedicles. Epidural lipomatosis.  CONUS MEDULLARIS: Conus medullaris terminates at L1-2 and demonstrates normal morphology and signal characteristics. T2 artifact lower thoracic spinal cord. Cauda equina is normal.  PARASPINAL AND SOFT TISSUES: Included prevertebral and paraspinal soft tissues are nonacute. Mild symmetric paraspinal muscle atrophy.  DISC LEVELS:  T12-L1, L1-2:  No disc bulge, canal stenosis nor neural foraminal narrowing.  L2-3: Small broad-based disc bulge asymmetric to the RIGHT, mild facet arthropathy and ligamentum flavum redundancy. No canal stenosis. Mild RIGHT neural foraminal narrowing.  L3-4: No disc bulge. Moderate facet arthropathy and ligamentum flavum redundancy. No canal stenosis. Epidural lipomatosis. No neural foraminal narrowing.  L4-5: Small broad-based central disc protrusion. Moderate facet arthropathy and ligamentum flavum redundancy with trace facet effusions which are likely reactive. No canal stenosis though, coupled dural lipomatosis narrows the thecal sac to 4 mm in AP dimension. No neural foraminal narrowing.  L5-S1: Small broad-based central disc protrusion. Moderate facet arthropathy. No canal stenosis though, epidural lipomatosis mildly narrows the thecal sac. No neural foraminal narrowing.  IMPRESSION: 1. Degenerative change of the lumbar spine superimposed on congenital canal narrowing and epidural lipomatosis. 2. No osseous canal stenosis though, epidural fat narrows the thecal sac at L4-5 and L5-S1. 3. Mild RIGHT L2-3 neural foraminal narrowing.   Electronically Signed   By: Elon Alas M.D.   On: 06/13/2017 19:06

## 2017-09-04 NOTE — Patient Instructions (Signed)

## 2017-09-11 ENCOUNTER — Ambulatory Visit: Payer: 59 | Admitting: Sports Medicine

## 2017-09-12 ENCOUNTER — Encounter: Payer: Self-pay | Admitting: Sports Medicine

## 2017-09-12 ENCOUNTER — Ambulatory Visit (INDEPENDENT_AMBULATORY_CARE_PROVIDER_SITE_OTHER): Payer: 59 | Admitting: Sports Medicine

## 2017-09-12 VITALS — BP 140/92 | HR 94 | Ht 63.0 in | Wt 280.4 lb

## 2017-09-12 DIAGNOSIS — R600 Localized edema: Secondary | ICD-10-CM | POA: Diagnosis not present

## 2017-09-12 DIAGNOSIS — M5416 Radiculopathy, lumbar region: Secondary | ICD-10-CM | POA: Diagnosis not present

## 2017-09-12 DIAGNOSIS — M5440 Lumbago with sciatica, unspecified side: Secondary | ICD-10-CM | POA: Diagnosis not present

## 2017-09-12 DIAGNOSIS — Q681 Congenital deformity of finger(s) and hand: Secondary | ICD-10-CM | POA: Diagnosis not present

## 2017-09-12 DIAGNOSIS — G8929 Other chronic pain: Secondary | ICD-10-CM | POA: Diagnosis not present

## 2017-09-12 DIAGNOSIS — R252 Cramp and spasm: Secondary | ICD-10-CM | POA: Diagnosis not present

## 2017-09-12 DIAGNOSIS — F17200 Nicotine dependence, unspecified, uncomplicated: Secondary | ICD-10-CM | POA: Diagnosis not present

## 2017-09-12 MED ORDER — GABAPENTIN 300 MG PO CAPS: ORAL_CAPSULE | ORAL | 1 refills | Status: DC

## 2017-09-12 NOTE — Patient Instructions (Signed)
We are checking more blood work today.    I would like for you to begin on the gabapentin just at night for the next week and increase it to twice per day over the next week, if you need to increase further to 3 times per day that is fine

## 2017-09-12 NOTE — Progress Notes (Signed)
Dana [name]. Dana [name], Dana [name] at [facility]  Dana [name] - 51 y.o. female MRN [id number]  Date of birth: 07-May-1966  Visit Date: 09/12/2017  PCP: [name], MD   Referred by: [name], MD   Scribe for today's visit: Thalia Bloodgood PT, LAT, ATC  SUBJECTIVE:  Dana [name] is here for New Patient (Initial Visit) (myalgia) .  Referred by: [name] Her muscle pain and spasms symptoms INITIALLY: Began about a year ago w/ no MOI Described as severe sharp pain. Worsened with nothing in particular Improved with rest Additional associated symptoms include: no N/T in B extremities    At this time symptoms are worsening compared to onset due to increased frequency of spasms. She has been taking potassium.  Neck surgery in April 2017 (fusion); R RC repair July 2017 Currently not working due to her surgeries and muscle pain and spasms - trying to get disability   ROS Denies night time disturbances. Reports fevers, chills, or night sweats.  Yes to night sweats. Denies unexplained weight loss. Denies personal history of cancer. Denies changes in bowel or bladder habits. Denies recent unreported falls. Reports new or worsening dyspnea or wheezing.  Yes to SOB if she walks too far or for too long. Denies headaches or dizziness.  Denies numbness, tingling or weakness  In the extremities.  Denies dizziness or presyncopal episodes Reports lower extremity edema.  B feet.   HISTORY & PERTINENT PRIOR DATA:  Prior History reviewed and updated per electronic medical record.  Significant history, findings, studies and interim changes include:  reports that she has quit smoking. Her smoking use included cigarettes. She quit after 33.00 years of use. she has never used smokeless tobacco. No results for input(s): HGBA1C, LABURIC, CREATINE in the last 8760 hours. No specialty comments available. Problem  Congenital  Deformity of Finger   Fifth fingers ulnar deviated proximally 35-40 degrees at the DIP consistent with Kirner's deformity   Muscle Cramps  Bilateral Leg Edema  Low Back Pain With Sciatica   MRI lumbar spine 06/05/2017: Multilevel significant degenerative changes in the lumbar spine. Epidural fat narrows the thecal sac at L4-5 and L5-S1.  There is a small amount of right neuroforaminal narrowing at L2-3.  Status post epidural steroid injections on 07/30/2017 and 09/04/2017 with only minimal improvements reported     OBJECTIVE:  VS:  HT:5\' 3"  (160 cm)   WT:280 lb 6.4 oz (127.2 kg)  BMI:49.68    BP:(!) 140/92  HR:94bpm  TEMP: ( )  RESP:98 %   HISTORY & PERTINENT PRIOR DATA:  Prior History reviewed and updated per electronic medical record.  Significant history, findings, studies and interim changes include:  reports that she has quit smoking. Her smoking use included cigarettes. She quit after 33.00 years of use. she has never used smokeless tobacco. No results for input(s): HGBA1C, LABURIC, CREATINE in the last 8760 hours. No specialty comments available. Problem  Congenital Deformity of Finger   Fifth fingers ulnar deviated proximally 35-40 degrees at the DIP consistent with Kirner's deformity   Muscle Cramps  Bilateral Leg Edema  Low Back Pain With Sciatica   MRI lumbar spine 06/05/2017: Multilevel significant degenerative changes in the lumbar spine. Epidural fat narrows the thecal sac at L4-5 and L5-S1.  There is a small amount of right neuroforaminal narrowing at L2-3.  Status post epidural steroid injections on 07/30/2017 and 09/04/2017 with only minimal improvements reported  OBJECTIVE:  VS:  HT:5\' 3"  (160 cm)   WT:280 lb 6.4 oz (127.2 kg)  BMI:49.68    BP:(!) 140/92  HR:94bpm  TEMP: ( )  RESP:98 %  PHYSICAL EXAM: Constitutional: WDWN, Non-toxic appearing. Psychiatric: Alert & appropriately interactive. Not depressed or anxious appearing. Respiratory: No  increased work of breathing. Trachea Midline Eyes: Pupils are equal. EOM intact without nystagmus. No scleral icterus Cardiovascular:  Peripheral Pulses: peripheral pulses symmetrical No clubbing or cyanosis appreciated Capillary Refill is normal, less than 2 seconds No signficant generalized edema/anasarca Sensory Exam: intact to light touch  Fifth fingers with slight volar and radial deviation of the distal phalanx.  Equivocal findings with jaw clinching as well as with blood pressure cuff compression of the arm. (Normal Chvostek sign and Trousseau sign) No additional findings.   ASSESSMENT & PLAN:   1. CIGARETTE SMOKER   2. Lumbar radiculopathy   3. Muscle cramps   4. Congenital deformity of finger   5. Bilateral leg edema   6. Chronic low back pain with sciatica, sciatica laterality unspecified, unspecified back pain laterality    PLAN:    Congenital deformity of finger Could consider further genetic evaluation if persistent cramping and unknown etiology  Bilateral leg edema Only trace edema today.  Cautious with gabapentin  Muscle cramps Unclear etiology, she does describe some symptoms consistent with trismus and overall physical exam had equivocal findings for this.  We will go ahead and check further lab work including ionized calcium, RBC magnesium and vitamin D.  If overall reassuring hopefully gabapentin will be beneficial.     ++++++++++++++++++++++++++++++++++++++++++++ Orders & Meds: Orders Placed This Encounter  Procedures  . Magnesium, RBC  . Calcium, ionized  . VITAMIN D 25 Hydroxy (Vit-D Deficiency, Fractures)  . EXTRA LAV TOP TUBE    Meds ordered this encounter  Medications  . gabapentin (NEURONTIN) 300 MG capsule    Sig: Start with 1 tab po qhs X 1 week, then increase to 1 tab po bid X 1 week then 1 tab po tid prn    Dispense:  90 capsule    Refill:  1  . Cholecalciferol 50000 units TABS    Sig: Take 1 tablet by mouth 2 (two) times a week.     Dispense:  16 tablet    Refill:  0    Do not substitute ergocalciferol. Dispense on 09/17/17    ++++++++++++++++++++++++++++++++++++++++++++ Follow-up: Return if symptoms worsen or fail to improve.   Pertinent documentation may be included in additional procedure notes, imaging studies, problem based documentation and patient instructions. Please see these sections of the encounter for additional information regarding this visit. CMA/ATC served as Education administrator during this visit. History, Physical, and Plan performed by medical provider. Documentation and orders reviewed and attested to.      Gerda Diss, [facility] Physician

## 2017-09-12 NOTE — Assessment & Plan Note (Signed)
Could consider further genetic evaluation if persistent cramping and unknown etiology

## 2017-09-12 NOTE — Assessment & Plan Note (Signed)
Unclear etiology, she does describe some symptoms consistent with trismus and overall physical exam had equivocal findings for this.  We will go ahead and check further lab work including ionized calcium, RBC magnesium and vitamin D.  If overall reassuring hopefully gabapentin will be beneficial.

## 2017-09-12 NOTE — Assessment & Plan Note (Signed)
Only trace edema today.  Cautious with gabapentin

## 2017-09-14 LAB — EXTRA LAV TOP TUBE

## 2017-09-14 LAB — MAGNESIUM, RBC: Magnesium RBC: 5.6 mg/dL (ref 4.0–6.4)

## 2017-09-14 LAB — VITAMIN D 25 HYDROXY (VIT D DEFICIENCY, FRACTURES): VIT D 25 HYDROXY: 7 ng/mL — AB (ref 30–100)

## 2017-09-14 LAB — CALCIUM, IONIZED: Calcium, Ion: 4.8 mg/dL (ref 4.8–5.6)

## 2017-09-15 MED ORDER — CHOLECALCIFEROL 1.25 MG (50000 UT) PO TABS: 1.0000 | ORAL_TABLET | ORAL | 0 refills | Status: DC

## 2017-09-15 NOTE — Progress Notes (Signed)
Markedly low vitamin D is very well could be contributing to some muscle weakness and spasms given that her ionized calcium is at the very bottom of normal limits.  Supplementation should be beneficial and I have sent a prescription into her pharmacy to fill on 09/17/17.    Needs recheck Vit D in 8 weeks

## 2017-09-17 ENCOUNTER — Telehealth: Payer: Self-pay | Admitting: Family Medicine

## 2017-09-17 NOTE — Telephone Encounter (Signed)
Copied from [place]. Topic: Quick Communication - See Telephone Encounter >> Sep 17, 2017 10:26 AM Ahmed Prima L wrote: CRM for notification. See Telephone encounter for:   09/17/17.  Pt is calling for her results on her labs [phone]

## 2017-09-17 NOTE — Telephone Encounter (Signed)
This has been dealt with in the result notes.

## 2017-09-17 NOTE — Telephone Encounter (Signed)
See note

## 2017-09-17 NOTE — Progress Notes (Signed)
Called pt and discussed her Vit D lab results.  Told her that an rx has been sent to her pharmacy for a Vit D supplement and will be available beginning today.  Also scheduled her for a f/u visit on 11/10/17.

## 2017-09-22 NOTE — Telephone Encounter (Signed)
Last OV 09/12/2017. Toprol-XL was last refilled :04/29/2016 for 90 days with 3 refills   Hydromorphone last refilled: 08/11/2017 disp 120 with no refills  Furosemide last refilled:01/15/2016 disp 60 with 11 refills  Sent to PCP for approval for Hydromorphone.

## 2017-09-22 NOTE — Telephone Encounter (Signed)
Copied from [place] [id number]. Topic: Quick Communication - Rx Refill/Question >> Sep 22, 2017 11:45 AM Carolyn Stare wrote: Has the patient contacted their pharmacy      said no more refills  RX metoprolol succinate (TOPROL-XL) 100 MG 24 hr tablet,        hydromorphone  ,      furosemide  Pharmacy  [facility]: Please be advised that RX refills may take up to 3 business days. We ask that you follow-up with your pharmacy.

## 2017-09-23 MED ORDER — HYDROMORPHONE HCL 4 MG PO TABS: 4.0000 mg | ORAL_TABLET | Freq: Four times a day (QID) | ORAL | 0 refills | Status: DC | PRN

## 2017-09-23 NOTE — Telephone Encounter (Signed)
This was sent.

## 2017-09-23 NOTE — Telephone Encounter (Signed)
[facility] [address] [address] [facility]  Cb# [phone]  Pt also would like to know if pain medication is ready for pickup, please call

## 2017-09-23 NOTE — Telephone Encounter (Signed)
[facility] [address] [facility] [address]. Sent to PCP

## 2017-09-23 NOTE — Telephone Encounter (Signed)
Done for one month only. She will need a PMV for any more

## 2017-09-24 ENCOUNTER — Telehealth: Payer: Self-pay | Admitting: Family Medicine

## 2017-09-24 NOTE — Telephone Encounter (Signed)
Please advise on the note below 

## 2017-09-24 NOTE — Telephone Encounter (Signed)
Copied from [place] [phone]. Topic: Quick Communication - Rx Refill/Question >> Sep 24, 2017  3:48 PM Corie Chiquito, [place] wrote: Medication: Hydromorphone 4mg    Has the patient contacted their pharmacy? No, Gets this medication from the office. Patient stated that she has already received her other medications     Preferred Pharmacy (with phone number or street name): [facility] [phone]  Agent: Please be advised that RX refills may take up to 3 business days. We ask that you follow-up with your pharmacy.

## 2017-09-24 NOTE — Telephone Encounter (Signed)
Requesting refill of Hydromorphone  LOV 09/12/17 with Dr. Paulla Fore

## 2017-09-24 NOTE — Telephone Encounter (Signed)
Please advise on the note below regarding medication refill.

## 2017-09-24 NOTE — Telephone Encounter (Signed)
Called pt and left a VM that this issue was addressed Rx was resent to correct pharmacy.

## 2017-09-24 NOTE — Telephone Encounter (Signed)
This medication is not refilled my Dr. Paulla Fore, looks like it is managed by Dr Sarajane Jews. Please forward back to appropriate pool/staff. Thanks!

## 2017-09-25 NOTE — Telephone Encounter (Signed)
Called and spoke with pt. Pt advised that this Rx has been sent into pharmacy already.

## 2017-10-02 ENCOUNTER — Ambulatory Visit (INDEPENDENT_AMBULATORY_CARE_PROVIDER_SITE_OTHER): Payer: 59 | Admitting: Surgery

## 2017-10-02 ENCOUNTER — Encounter (INDEPENDENT_AMBULATORY_CARE_PROVIDER_SITE_OTHER): Payer: Self-pay | Admitting: Surgery

## 2017-10-02 VITALS — BP 137/87 | HR 104 | Ht 64.0 in | Wt 260.0 lb

## 2017-10-02 DIAGNOSIS — M5136 Other intervertebral disc degeneration, lumbar region: Secondary | ICD-10-CM | POA: Diagnosis not present

## 2017-10-02 NOTE — Progress Notes (Signed)
Office Visit Note   [name]           Date of Birth: 05/21/1966           MRN: [id number] Visit Date: 10/02/2017              Requested by: [name], MD [place], [place] [address] PCP: [name], MD   Assessment & Plan: Visit Diagnoses:  1. Degenerative disc disease, lumbar     Plan: Since patient had such a good response with L4-5 bilateral facet injections I will schedule her for bilateral L4-5 medial branch blocks with Dr. Ernestina Patches. We'll also schedule her for formal PT for 2 visits and they will instruct good home exercise program. She has done PT in the past along with home exercises and she states that this aggravated her back pain. Follow with me 1 week after medial branch blocks. We did discuss doing bilateral L4-5 radiofrequency ablation. All questions answered.  Follow-Up Instructions: Return in about 3 weeks (around 10/23/2017) for [facility] recheck after medial branch blocks.   Orders:  Orders Placed This Encounter  Procedures  . Ambulatory referral to Physical Medicine Rehab   No orders of the defined types were placed in this encounter.     Procedures: No procedures performed   Clinical Data: No additional findings.   Subjective: Chief Complaint  Patient presents with  . Lower Back - Follow-up    Patient had Bilateral L4-5 Facet joint injection with Dr. Ernestina Patches on 09/04/2017 and she did get relief after 4 days, lasting until 3 days ago.    HPI Patient returns after having bilateral L4-5 facet injections with Dr. Ernestina Patches. States that for a couple of weeks she was "80% better". All of her low back pain and burning around her sacrum was relieved. Pain has returned. She is complaining of right lateral hip pain around the greater trochanter but no specific lumbar radiculopathy.  Previous lumbar spine MRI September 2018 showed L4-5 moderate facet arthropathy and ligamentum flavum redundancy with trace facet effusions, no  canal stenosis.   Review of Systems no current cardiopulmonary GI GU issues  Objective: Vital Signs: BP 137/87 (BP Location: Left Arm, Patient Position: Sitting)   Pulse (!) 104   Ht 5\' 4"  (1.626 m)   Wt 260 lb (117.9 kg)   LMP 08/17/2012   BMI 44.63 kg/m   Physical Exam  Constitutional: She is oriented to person, place, and time. No distress.  HENT:  Head: Normocephalic and atraumatic.  Eyes: EOM are normal. Pupils are equal, round, and reactive to light.  Musculoskeletal:  Patient tender over the central lower lumbar spine. Also tender around the bilateral SI joints. Low back pain with lumbar extension and flexion. Negative logroll bilateral hips. Negative straight leg raise. She is somewhat tender over the right hip greater trochanter bursa.  Neurological: She is alert and oriented to person, place, and time.  Skin: Skin is warm and dry.  Psychiatric: She has a normal mood and affect.    Ortho Exam  Specialty Comments:  No specialty comments available.  Imaging: No results found.   PMFS History: Patient Active Problem List   Diagnosis Date Noted  . 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 Primary osteoarthritis of right knee 02/27/2017  . Lateral epicondylitis of right elbow 11/11/2016  . Muscle cramps 01/15/2016  . Bilateral leg edema 01/08/2016  . Radiculopathy 12/20/2015  . Hyperglycemia 12/21/2014  . Mastodynia, female 11/30/2014  .  S/P excision of fibroadenoma of breast 11/30/2014  . Angioedema of lips 04/07/2014  . Fibroadenoma of right breast 03/07/2014  . Pelvic pain in female 06/19/2012  . Menorrhagia 06/11/2012  . SUI (stress urinary incontinence, female) 06/11/2012  . HTN (hypertension) 06/11/2012  . IBS (irritable bowel syndrome) - diarrhea predominant 03/17/2012  . MICROSCOPIC HEMATURIA 11/30/2010  . BREAST PAIN, RIGHT 11/30/2010  . BREAST MASS, RIGHT 01/12/2010  . HYPERCHOLESTEROLEMIA 12/07/2007  . CIGARETTE SMOKER 12/07/2007  .  DEGENERATIVE JOINT DISEASE 12/07/2007  . Low back pain with sciatica 12/07/2007  . HEADACHE 12/07/2007  . Obesity 08/03/2007  . ANXIETY 08/03/2007   Past Medical History:  Diagnosis Date  . Anxiety   . Back pain with radiation   . Breast mass, right   . Complication of anesthesia 04/07/14  . DJD (degenerative joint disease)   . Groin abscess   . Headache(784.0)   . History of IBS   . Hypercholesterolemia   . IBS (irritable bowel syndrome)   . Lactose intolerance   . Mild hypertension   . Obesity   . Tobacco use disorder     Family History  Problem Relation Age of Onset  . Diabetes Father   . Diabetes Mother   . Prostate cancer Paternal Grandfather   . Breast cancer Maternal Aunt 46    Past Surgical History:  Procedure Laterality Date  . ABDOMINAL HYSTERECTOMY    . ANTERIOR CERVICAL DECOMP/DISCECTOMY FUSION N/A 12/20/2015   Procedure: ANTERIOR CERVICAL DECOMPRESSION FUSION CERVICAL 4-5, CERVICAL 5-6, CERVICAL 6-7 WITH INSTRUMENTATION AND ALLOGRAFT;  Surgeon: Phylliss Bob, MD;  Location: [facility];  Service: Orthopedics;  Laterality: N/A;  Anterior cervical decompression fusion, cervical 4-5, cervical 5-6, cervical 6-7 with instrumentation and allograft  . BACK SURGERY    . BILATERAL SALPINGECTOMY  09/03/2012   Procedure: BILATERAL SALPINGECTOMY;  Surgeon: [name], MD;  Location: [facility];  Service: Gynecology;  Laterality: Bilateral;  . BREAST BIOPSY Right 04/07/2014   Procedure: REMOVAL RIGHT BREAST MASS WITH WIRE LOCALIZATION;  Surgeon: [name], MD;  Location: [facility];  Service: General;  Laterality: Right;  . BREAST LUMPECTOMY     x2  . COLONOSCOPY W/ BIOPSIES  04/24/2012   per Dr. Carlean Purl, clear, repeat in 10 yrs   . disectomy    . ESOPHAGOGASTRODUODENOSCOPY    . FINGER SURGERY     Right index-excision of mass   . FOOT SURGERY Right    Bone Spurs  . LAPAROSCOPIC HYSTERECTOMY  09/03/2012   Procedure: HYSTERECTOMY TOTAL LAPAROSCOPIC;  Surgeon: [name], MD;  Location: [facility];  Service: Gynecology;  Laterality: N/A;  . LUMBAR [place] Torres    . TUBAL LIGATION     Social History   Occupational History  . Occupation: Optometrist  Tobacco Use  . Smoking status: Former Smoker    Years: 33.00    Types: Cigarettes  . Smokeless tobacco: Never Used  . Tobacco comment: quit March 2017  Substance and Sexual Activity  . Alcohol use: No    Alcohol/week: 0.0 oz    Comment: occ  . Drug use: No  . Sexual activity: Yes    Birth control/protection: Surgical    Comment: tubal ligation

## 2017-10-07 ENCOUNTER — Telehealth (INDEPENDENT_AMBULATORY_CARE_PROVIDER_SITE_OTHER): Payer: Self-pay

## 2017-10-07 NOTE — Telephone Encounter (Signed)
Patient called stated she saw Jeneen Rinks last week. She was very tearful stating she cannot continue to be in pain like she is. Jeneen Rinks had given her rx for PT but she is not able to do this because she is in so much pain. Stated that all she could do was lay in bed. She was supposed to have [facility] but has not heard from them about getting appt scheduled with Dr Ernestina Patches. Please call patient back to further advise what she could do for the pain. Contact for patient is [phone]

## 2017-10-07 NOTE — Telephone Encounter (Signed)
Patient called stated she saw Jeneen Rinks last week. She was very tearful stating she cannot continue to be in pain like she is. Jeneen Rinks had given her rx for PT but she is not able to do this because she is in so much pain. Stated that all she could do was lay in bed. She was supposed to have [facility] but has not heard from them about getting appt scheduled with Dr Ernestina Patches. Please call patient back to further advise what she could do for the pain. Contact for patient is [phone]

## 2017-10-08 ENCOUNTER — Telehealth (INDEPENDENT_AMBULATORY_CARE_PROVIDER_SITE_OTHER): Payer: Self-pay | Admitting: Specialist

## 2017-10-08 ENCOUNTER — Telehealth (INDEPENDENT_AMBULATORY_CARE_PROVIDER_SITE_OTHER): Payer: Self-pay | Admitting: Radiology

## 2017-10-08 NOTE — Telephone Encounter (Signed)
Patient called stating she is in bad pain and Dr Romona Curls office has not called her for the injection. Advised patient that Dr Ernestina Patches office does their own scheduling and they would call he. Then she stated that she was suppose to do therapy but hurting too bad. Trans patient to Triage

## 2017-10-08 NOTE — Telephone Encounter (Signed)
Injection has been scheduled for 10/20/2017 with Dr. Ernestina Patches--- I will put her on the cancellation list for Dr. Louanne Skye

## 2017-10-08 NOTE — Telephone Encounter (Signed)
Patient is calling triage this morning. Very difficult to understand, she is very tearful and anxious, states she is in severe pain. She cannot pursue physical therapy due to pain. She called yesterday as well wanting to know what else she can do for pain. Advised Dr. Louanne Skye has not had time to reply he is seeing patients this morning. She is pending ESI appointment with Dr. Ernestina Patches. She is taking tylenol, she also takes diluadid which she gets from Dr. Sarajane Jews. She states she was up all day yesterday due to her pain, waiting for [place] to call her back. Apologized for this. Can we check on status of ESI appointment for patient today and give her a call back today?

## 2017-10-08 NOTE — Telephone Encounter (Signed)
Called pt and is scheduled 10/20/2017 at 8:15

## 2017-10-20 ENCOUNTER — Ambulatory Visit (INDEPENDENT_AMBULATORY_CARE_PROVIDER_SITE_OTHER): Payer: 59

## 2017-10-20 ENCOUNTER — Encounter (INDEPENDENT_AMBULATORY_CARE_PROVIDER_SITE_OTHER): Payer: Self-pay | Admitting: Physical Medicine and Rehabilitation

## 2017-10-20 ENCOUNTER — Ambulatory Visit (INDEPENDENT_AMBULATORY_CARE_PROVIDER_SITE_OTHER): Payer: 59 | Admitting: Physical Medicine and Rehabilitation

## 2017-10-20 VITALS — BP 165/94 | HR 94 | Temp 97.8°F

## 2017-10-20 DIAGNOSIS — M47816 Spondylosis without myelopathy or radiculopathy, lumbar region: Secondary | ICD-10-CM | POA: Diagnosis not present

## 2017-10-20 MED ORDER — BUPIVACAINE HCL 0.5 % IJ SOLN
3.0000 mL | Freq: Once | INTRAMUSCULAR | Status: AC
  Administered 2017-10-20: 3 mL

## 2017-10-20 MED ORDER — DEXAMETHASONE SODIUM PHOSPHATE 10 MG/ML IJ SOLN
15.0000 mg | Freq: Once | INTRAMUSCULAR | Status: AC
  Administered 2017-10-20: 15 mg

## 2017-10-20 NOTE — Progress Notes (Deleted)
Pt states pain in the lower back that radiates in right leg. Pt states pain has been worse since the second week in January 2019. Pt states last injection help, but did not last long. Pt states normal activities makes pain worse, nothing makes pain better. +Driver, -BT, -Dye Allergies.

## 2017-10-20 NOTE — Patient Instructions (Signed)

## 2017-10-21 NOTE — Procedures (Signed)
Dana [name] is a 52 year old female who is followed by Dr. Louanne Skye and Benjiman Core.  She has imaging that is recent showing stenosis at L4-5 with bilateral facet arthropathy.  In December we completed facet joint blocks at L4-5 with good relief of her back pain but it was temporary.  We are going to complete a second set and a double block paradigm of facet joint/medial branch blocks with a pain diary.  If she does well with this and has failed conservative care including physical therapy would consider radiofrequency ablation.  If she does not get much relief Dr. Louanne Skye and Jeneen Rinks probably look at the stenosis at the L4-5 level.  Lumbar Diagnostic Facet Joint Nerve Block with Fluoroscopic Guidance   [name]      Date of Birth: 07-18-1966 MRN: [id number] PCP: [name], MD      Visit Date: 10/20/2017   Universal Protocol:    Date/Time: 02/05/195:32 AM  Consent Given By: the patient  Position: PRONE  Additional Comments: Vital signs were monitored before and after the procedure. Patient was prepped and draped in the usual sterile fashion. The correct patient, procedure, and site was verified.   Injection Procedure Details:  Procedure Site One Meds Administered:  Meds ordered this encounter  Medications  . dexamethasone (DECADRON) injection 15 mg  . bupivacaine (MARCAINE) 0.5 % (with pres) injection 3 mL     Laterality: Bilateral  Location/Site:  L4-L5  Needle size: 22 ga.  Needle type:spinal  Needle Placement: Oblique pedical  Findings:   -Comments: There was excellent flow of contrast along the articular pillars without intravascular flow.  Procedure Details: The fluoroscope beam is vertically oriented in AP and then obliqued 15 to 20 degrees to the ipsilateral side of the desired nerve to achieve the "Scotty dog" appearance.  The skin over the target area of the junction of the superior articulating process and the transverse process (sacral ala if  blocking the L5 dorsal rami) was locally anesthetized with a 1 ml volume of 1% Lidocaine without Epinephrine.  The spinal needle was inserted and advanced in a trajectory view down to the target.   After contact with periosteum and negative aspirate for blood and CSF, correct placement without intravascular or epidural spread was confirmed by injecting 0.5 ml. of Isovue-250.  A spot radiograph was obtained of this image.    Next, a 0.5 ml. volume of the injectate described above was injected. The needle was then redirected to the other facet joint nerves mentioned above if needed.  Prior to the procedure, the patient was given a Pain Diary which was completed for baseline measurements.  After the procedure, the patient rated their pain every 30 minutes and will continue rating at this frequency for a total of 5 hours.  The patient has been asked to complete the Diary and return to [place] by mail, fax or hand delivered as soon as possible.   Additional Comments:  The patient tolerated the procedure well Dressing: Band-Aid    Post-procedure details: Patient was observed during the procedure. Post-procedure instructions were reviewed.  Patient left the clinic in stable condition.  Pertinent Imaging: MRI LUMBAR SPINE WITHOUT CONTRAST  TECHNIQUE: Multiplanar, multisequence MR imaging of the lumbar spine was performed. No intravenous contrast was administered.  COMPARISON:  Lumbar spine radiographs April 05, 2017 MRI of lumbar spine December 09, 2007  FINDINGS: Large body habitus decreases overall signal to noise ratio.  SEGMENTATION: For the purposes of this report, the  last well-formed intervertebral disc will be reported as L5-S1.  ALIGNMENT: Maintained lumbar lordosis. No malalignment.  VERTEBRAE:Vertebral bodies are intact. Severe L1 scratches severe L2-3 disc height loss progressed from prior examination with mild subacute on chronic discogenic endplate changes. Remaining  discs demonstrate normal morphology with mild desiccation. No suspicious or acute bone marrow signal in the low lumbar spine. Mild subacute on chronic discogenic endplate changes included thoracic spine. Congenital canal narrowing on the basis of foreshortened pedicles. Epidural lipomatosis.  CONUS MEDULLARIS: Conus medullaris terminates at L1-2 and demonstrates normal morphology and signal characteristics. T2 artifact lower thoracic spinal cord. Cauda equina is normal.  PARASPINAL AND SOFT TISSUES: Included prevertebral and paraspinal soft tissues are nonacute. Mild symmetric paraspinal muscle atrophy.  DISC LEVELS:  T12-L1, L1-2: No disc bulge, canal stenosis nor neural foraminal narrowing.  L2-3: Small broad-based disc bulge asymmetric to the RIGHT, mild facet arthropathy and ligamentum flavum redundancy. No canal stenosis. Mild RIGHT neural foraminal narrowing.  L3-4: No disc bulge. Moderate facet arthropathy and ligamentum flavum redundancy. No canal stenosis. Epidural lipomatosis. No neural foraminal narrowing.  L4-5: Small broad-based central disc protrusion. Moderate facet arthropathy and ligamentum flavum redundancy with trace facet effusions which are likely reactive. No canal stenosis though, coupled dural lipomatosis narrows the thecal sac to 4 mm in AP dimension. No neural foraminal narrowing.  L5-S1: Small broad-based central disc protrusion. Moderate facet arthropathy. No canal stenosis though, epidural lipomatosis mildly narrows the thecal sac. No neural foraminal narrowing.  IMPRESSION: 1. Degenerative change of the lumbar spine superimposed on congenital canal narrowing and epidural lipomatosis. 2. No osseous canal stenosis though, epidural fat narrows the thecal sac at L4-5 and L5-S1. 3. Mild RIGHT L2-3 neural foraminal narrowing.   Electronically Signed   By: Elon Alas M.D.   On: 06/13/2017 19:06

## 2017-10-29 ENCOUNTER — Telehealth: Payer: Self-pay | Admitting: Family Medicine

## 2017-10-29 NOTE — Telephone Encounter (Signed)
Pt  Requesting a  Refill  Of  Dilaudid  LOV 08-29-2017    Last Filled 09-23-2017   Pharmacy     Register

## 2017-10-29 NOTE — Telephone Encounter (Signed)
Copied from [place]. Topic: Quick Communication - Rx Refill/Question >> Oct 29, 2017  4:45 PM Bea Graff, NT wrote: Medication: HYDROmorphone (DILAUDID)   Has the patient contacted their pharmacy? Yes.     (Agent: If no, request that the patient contact the pharmacy for the refill.)   Preferred Pharmacy (with phone number or street name): [facility] [address]   Agent: Please be advised that RX refills may take up to 3 business days. We ask that you follow-up with your pharmacy.

## 2017-10-30 ENCOUNTER — Encounter (INDEPENDENT_AMBULATORY_CARE_PROVIDER_SITE_OTHER): Payer: Self-pay | Admitting: Surgery

## 2017-10-30 ENCOUNTER — Ambulatory Visit (INDEPENDENT_AMBULATORY_CARE_PROVIDER_SITE_OTHER): Payer: 59 | Admitting: Surgery

## 2017-10-30 DIAGNOSIS — M5136 Other intervertebral disc degeneration, lumbar region: Secondary | ICD-10-CM | POA: Diagnosis not present

## 2017-10-30 DIAGNOSIS — M5441 Lumbago with sciatica, right side: Secondary | ICD-10-CM | POA: Diagnosis not present

## 2017-10-30 NOTE — Progress Notes (Signed)
Office Visit Note   [name]           Date of Birth: Mar 03, 1966           MRN: [id number] Visit Date: 10/30/2017              Requested by: [name], MD [place], [place] [address] PCP: [name], MD   Assessment & Plan: Visit Diagnoses:  1. Degenerative disc disease, lumbar   2. Acute right-sided low back pain with right-sided sciatica     Plan: Since patient did not have any relief with most recent bilateral L4-5 facet injections will schedule her an appointment with Dr. Louanne Skye to discuss possible surgical intervention.  Patient requested a permanent handicap license plate and I advised her that without having any surgical procedure bias that this is not routinely done.  She can discuss that with Dr. Louanne Skye when she sees him.  Patient also states that she is currently seeking disability.  Follow-Up Instructions: Return in about 2 weeks (around 11/13/2017) for with Dr Louanne Skye to discuss surgery.   Orders:  No orders of the defined types were placed in this encounter.  No orders of the defined types were placed in this encounter.     Procedures: No procedures performed   Clinical Data: No additional findings.   Subjective: Chief Complaint  Patient presents with  . Lower Back - Follow-up    HPI Patient comes in for recheck of her back pain and lower extremity radiculopathy.  States that she did not have any relief with most recent bilateral L4-5 facet injections.  States that she is willing to proceed with surgery.   Objective: Vital Signs: LMP 08/17/2012   Physical Exam  Constitutional: She is oriented to person, place, and time. No distress.  HENT:  Head: Normocephalic and atraumatic.  Pulmonary/Chest: No respiratory distress.  Musculoskeletal:  Patient has lumbar paraspinal tenderness.  Negative logroll.  Negative straight leg raise.  Tender over the bilateral hip greater trochanter bursa.    Neurological: She is  alert and oriented to person, place, and time.  Skin: Skin is warm and dry.    Ortho Exam  Specialty Comments:  No specialty comments available.  Imaging: No results found.   PMFS History: Patient Active Problem List   Diagnosis Date Noted  . 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 Primary osteoarthritis of right knee 02/27/2017  . Lateral epicondylitis of right elbow 11/11/2016  . Muscle cramps 01/15/2016  . Bilateral leg edema 01/08/2016  . Radiculopathy 12/20/2015  . Hyperglycemia 12/21/2014  . Mastodynia, female 11/30/2014  . S/P excision of fibroadenoma of breast 11/30/2014  . Angioedema of lips 04/07/2014  . Fibroadenoma of right breast 03/07/2014  . Pelvic pain in female 06/19/2012  . Menorrhagia 06/11/2012  . SUI (stress urinary incontinence, female) 06/11/2012  . HTN (hypertension) 06/11/2012  . IBS (irritable bowel syndrome) - diarrhea predominant 03/17/2012  . MICROSCOPIC HEMATURIA 11/30/2010  . BREAST PAIN, RIGHT 11/30/2010  . BREAST MASS, RIGHT 01/12/2010  . HYPERCHOLESTEROLEMIA 12/07/2007  . CIGARETTE SMOKER 12/07/2007  . DEGENERATIVE JOINT DISEASE 12/07/2007  . Low back pain with sciatica 12/07/2007  . HEADACHE 12/07/2007  . Obesity 08/03/2007  . ANXIETY 08/03/2007   Past Medical History:  Diagnosis Date  . Anxiety   . Back pain with radiation   . Breast mass, right   . Complication of anesthesia 04/07/14  . DJD (degenerative joint disease)   . Groin abscess   .  Headache(784.0)   . History of IBS   . Hypercholesterolemia   . IBS (irritable bowel syndrome)   . Lactose intolerance   . Mild hypertension   . Obesity   . Tobacco use disorder     Family History  Problem Relation Age of Onset  . Diabetes Father   . Diabetes Mother   . Prostate cancer Paternal Grandfather   . Breast cancer Maternal Aunt 46    Past Surgical History:  Procedure Laterality Date  . ABDOMINAL HYSTERECTOMY    . ANTERIOR CERVICAL DECOMP/DISCECTOMY FUSION N/A  12/20/2015   Procedure: ANTERIOR CERVICAL DECOMPRESSION FUSION CERVICAL 4-5, CERVICAL 5-6, CERVICAL 6-7 WITH INSTRUMENTATION AND ALLOGRAFT;  Surgeon: Phylliss Bob, MD;  Location: [facility];  Service: Orthopedics;  Laterality: N/A;  Anterior cervical decompression fusion, cervical 4-5, cervical 5-6, cervical 6-7 with instrumentation and allograft  . BACK SURGERY    . BILATERAL SALPINGECTOMY  09/03/2012   Procedure: BILATERAL SALPINGECTOMY;  Surgeon: [name], MD;  Location: [facility];  Service: Gynecology;  Laterality: Bilateral;  . BREAST BIOPSY Right 04/07/2014   Procedure: REMOVAL RIGHT BREAST MASS WITH WIRE LOCALIZATION;  Surgeon: [name], MD;  Location: [facility];  Service: General;  Laterality: Right;  . BREAST LUMPECTOMY     x2  . COLONOSCOPY W/ BIOPSIES  04/24/2012   per Dr. Carlean Purl, clear, repeat in 10 yrs   . disectomy    . ESOPHAGOGASTRODUODENOSCOPY    . FINGER SURGERY     Right index-excision of mass   . FOOT SURGERY Right    Bone Spurs  . LAPAROSCOPIC HYSTERECTOMY  09/03/2012   Procedure: HYSTERECTOMY TOTAL LAPAROSCOPIC;  Surgeon: [name], MD;  Location: [facility];  Service: Gynecology;  Laterality: N/A;  . LUMBAR [place]    . TUBAL LIGATION     Social History   Occupational History  . Occupation: Optometrist  Tobacco Use  . Smoking status: Former Smoker    Years: 33.00    Types: Cigarettes  . Smokeless tobacco: Never Used  . Tobacco comment: quit March 2017  Substance and Sexual Activity  . Alcohol use: No    Alcohol/week: 0.0 oz    Comment: occ  . Drug use: No  . Sexual activity: Yes    Birth control/protection: Surgical    Comment: tubal ligation

## 2017-10-31 MED ORDER — HYDROMORPHONE HCL 4 MG PO TABS: 4.0000 mg | ORAL_TABLET | Freq: Four times a day (QID) | ORAL | 0 refills | Status: DC | PRN

## 2017-10-31 NOTE — Telephone Encounter (Signed)
OK to refill

## 2017-10-31 NOTE — Telephone Encounter (Signed)
Left a detailed voice message on pt's voicemail.

## 2017-10-31 NOTE — Telephone Encounter (Signed)
I sent in a one time refill only. She needs to schedule a PMV for any more

## 2017-11-10 ENCOUNTER — Ambulatory Visit: Payer: 59 | Admitting: Sports Medicine

## 2017-11-13 ENCOUNTER — Ambulatory Visit (INDEPENDENT_AMBULATORY_CARE_PROVIDER_SITE_OTHER): Payer: 59 | Admitting: Specialist

## 2017-11-13 ENCOUNTER — Encounter (INDEPENDENT_AMBULATORY_CARE_PROVIDER_SITE_OTHER): Payer: Self-pay | Admitting: Specialist

## 2017-11-13 VITALS — BP 179/101 | HR 95 | Ht 63.0 in | Wt 260.0 lb

## 2017-11-13 DIAGNOSIS — M47816 Spondylosis without myelopathy or radiculopathy, lumbar region: Secondary | ICD-10-CM | POA: Diagnosis not present

## 2017-11-13 DIAGNOSIS — D1779 Benign lipomatous neoplasm of other sites: Secondary | ICD-10-CM | POA: Diagnosis not present

## 2017-11-13 DIAGNOSIS — M5136 Other intervertebral disc degeneration, lumbar region: Secondary | ICD-10-CM

## 2017-11-13 MED ORDER — NABUMETONE 500 MG PO TABS: 500.0000 mg | ORAL_TABLET | Freq: Two times a day (BID) | ORAL | 6 refills | Status: DC

## 2017-11-13 NOTE — Patient Instructions (Addendum)
Avoid bending, stooping and avoid lifting weights greater than 10 lbs. Avoid prolong standing and walking. Avoid frequent bending and stooping  No lifting greater than 10 lbs. May use ice or moist heat for pain. Weight loss is of benefit. Handicap license is approved. Dr. Romona Curls secretary/Assistant will call to arrange for bilateral L4-5 facet steroid injections, may consider for radiofrequency ablation if injections seem to help.   Relafen 500 mg tablet take twice a day with a meal or snack.

## 2017-11-13 NOTE — Progress Notes (Signed)
Office Visit Note   [name]           Date of Birth: [birth year]           MRN: [id number] Visit Date: 11/13/2017              Requested by: [name], MD [place], [place] [address] PCP: [name], MD   Assessment & Plan: Visit Diagnoses:  1. Degenerative disc disease, lumbar   2. Epidural lipomatosis   3. Spondylosis without myelopathy or radiculopathy, lumbar region     Plan: Avoid bending, stooping and avoid lifting weights greater than 10 lbs. Avoid prolong standing and walking. Avoid frequent bending and stooping  No lifting greater than 10 lbs. May use ice or moist heat for pain. Weight loss is of benefit. Handicap license is approved. Dr. Romona Curls secretary/Assistant will call to arrange for bilateral L4-5 facet steroid injections, may consider for radiofrequency ablation if injections seem to help Relafen 500 mg tablet take twice a day with a meal or snack.   Follow-Up Instructions: Return in about 4 weeks (around 12/11/2017).   Orders:  No orders of the defined types were placed in this encounter.  No orders of the defined types were placed in this encounter.     Procedures: No procedures performed   Clinical Data: No additional findings.   Subjective: Chief Complaint  Patient presents with  . Lower Back - Follow-up, Pain    52 year old female with history of 3 level cervical fusion for foramenal stenosis. She is experiencing pain with  Prolong standing and walking. Pain is in her back relieved with facet injections In Dec but not with ESI at L2-3 in 07/2017. Then facet blocks bilaterally at L3-4 and L4-5 bilateral did not help.     Review of Systems  Constitutional: Negative.   HENT: Negative.   Eyes: Negative.   Respiratory: Negative.   Cardiovascular: Negative.   Gastrointestinal: Negative.   Endocrine: Negative.   Genitourinary: Negative.   Musculoskeletal: Negative.   Skin: Negative.     Allergic/Immunologic: Negative.   Neurological: Negative.   Hematological: Negative.   Psychiatric/Behavioral: Negative.      Objective: Vital Signs: BP (!) 179/101   Pulse 95   Ht 5\' 3"  (1.6 m)   Wt 260 lb (117.9 kg)   LMP 08/17/2012   BMI 46.06 kg/m   Physical Exam  Back Exam   Tenderness  The patient is experiencing tenderness in the lumbar.  Range of Motion  Extension: abnormal  Flexion: abnormal  Lateral bend right: abnormal  Lateral bend left: abnormal  Rotation right: abnormal  Rotation left: abnormal   Muscle Strength  Right Quadriceps:  5/5  Left Quadriceps:  5/5  Right Hamstrings:  5/5  Left Hamstrings:  5/5   Tests  Straight leg raise right: negative Straight leg raise left: negative  Reflexes  Patellar: normal Achilles: normal Babinski's sign: normal   Other  Toe walk: normal Heel walk: normal Sensation: normal Gait: abnormal  Erythema: no back redness      Specialty Comments:  No specialty comments available.  Imaging: No results found.   PMFS History: Patient Active Problem List   Diagnosis Date Noted  . 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 Primary osteoarthritis of right knee 02/27/2017  . Lateral epicondylitis of right elbow 11/11/2016  . Muscle cramps 01/15/2016  . Bilateral leg edema 01/08/2016  . Radiculopathy 12/20/2015  . Hyperglycemia 12/21/2014  .  Mastodynia, female 11/30/2014  . S/P excision of fibroadenoma of breast 11/30/2014  . Angioedema of lips 04/07/2014  . Fibroadenoma of right breast 03/07/2014  . Pelvic pain in female 06/19/2012  . Menorrhagia 06/11/2012  . SUI (stress urinary incontinence, female) 06/11/2012  . HTN (hypertension) 06/11/2012  . IBS (irritable bowel syndrome) - diarrhea predominant 03/17/2012  . MICROSCOPIC HEMATURIA 11/30/2010  . BREAST PAIN, RIGHT 11/30/2010  . BREAST MASS, RIGHT 01/12/2010  . HYPERCHOLESTEROLEMIA 12/07/2007  . CIGARETTE SMOKER 12/07/2007  .  DEGENERATIVE JOINT DISEASE 12/07/2007  . Low back pain with sciatica 12/07/2007  . HEADACHE 12/07/2007  . Obesity 08/03/2007  . ANXIETY 08/03/2007   Past Medical History:  Diagnosis Date  . Anxiety   . Back pain with radiation   . Breast mass, right   . Complication of anesthesia 04/07/14  . DJD (degenerative joint disease)   . Groin abscess   . Headache(784.0)   . History of IBS   . Hypercholesterolemia   . IBS (irritable bowel syndrome)   . Lactose intolerance   . Mild hypertension   . Obesity   . Tobacco use disorder     Family History  Problem Relation Age of Onset  . Diabetes Father   . Diabetes Mother   . Prostate cancer Paternal Grandfather   . Breast cancer Maternal Aunt 46    Past Surgical History:  Procedure Laterality Date  . ABDOMINAL HYSTERECTOMY    . ANTERIOR CERVICAL DECOMP/DISCECTOMY FUSION N/A 12/20/2015   Procedure: ANTERIOR CERVICAL DECOMPRESSION FUSION CERVICAL 4-5, CERVICAL 5-6, CERVICAL 6-7 WITH INSTRUMENTATION AND ALLOGRAFT;  Surgeon: Phylliss Bob, MD;  Location: [facility];  Service: Orthopedics;  Laterality: N/A;  Anterior cervical decompression fusion, cervical 4-5, cervical 5-6, cervical 6-7 with instrumentation and allograft  . BACK SURGERY    . BILATERAL SALPINGECTOMY  09/03/2012   Procedure: BILATERAL SALPINGECTOMY;  Surgeon: [name], MD;  Location: [facility];  Service: Gynecology;  Laterality: Bilateral;  . BREAST BIOPSY Right 04/07/2014   Procedure: REMOVAL RIGHT BREAST MASS WITH WIRE LOCALIZATION;  Surgeon: [name], MD;  Location: [facility];  Service: General;  Laterality: Right;  . BREAST LUMPECTOMY     x2  . COLONOSCOPY W/ BIOPSIES  04/24/2012   per Dr. Carlean Purl, clear, repeat in 10 yrs   . disectomy    . ESOPHAGOGASTRODUODENOSCOPY    . FINGER SURGERY     Right index-excision of mass   . FOOT SURGERY Right    Bone Spurs  . LAPAROSCOPIC HYSTERECTOMY  09/03/2012   Procedure: HYSTERECTOMY TOTAL LAPAROSCOPIC;  Surgeon: [name], MD;  Location: [facility];  Service: Gynecology;  Laterality: N/A;  . LUMBAR [place]    . TUBAL LIGATION     Social History   Occupational History  . Occupation: Optometrist  Tobacco Use  . Smoking status: Former Smoker    Years: 33.00    Types: Cigarettes  . Smokeless tobacco: Never Used  . Tobacco comment: quit March 2017  Substance and Sexual Activity  . Alcohol use: No    Alcohol/week: 0.0 oz    Comment: occ  . Drug use: No  . Sexual activity: Yes    Birth control/protection: Surgical    Comment: tubal ligation

## 2017-11-19 ENCOUNTER — Ambulatory Visit: Payer: 59 | Admitting: Sports Medicine

## 2017-11-25 ENCOUNTER — Ambulatory Visit: Payer: 59 | Admitting: Sports Medicine

## 2017-11-25 DIAGNOSIS — Z0289 Encounter for other administrative examinations: Secondary | ICD-10-CM

## 2017-11-27 ENCOUNTER — Encounter: Payer: Self-pay | Admitting: Sports Medicine

## 2017-12-02 ENCOUNTER — Telehealth (INDEPENDENT_AMBULATORY_CARE_PROVIDER_SITE_OTHER): Payer: Self-pay | Admitting: Specialist

## 2017-12-02 NOTE — Telephone Encounter (Signed)
Patient called wanting to speak with you, she states her attorney is mailing a letter to Dr. Louanne Skye for him to fill out for her to receive disability. If you could give her a call back at [phone]

## 2017-12-03 NOTE — Telephone Encounter (Signed)
I called and spoke with patient.  She wants to if she is totally disabled.  If so she needs a letter for her attorney that states she is totally disabled.  Her attorney is going to be contacting Dr. Louanne Skye about this also.

## 2017-12-08 ENCOUNTER — Ambulatory Visit (INDEPENDENT_AMBULATORY_CARE_PROVIDER_SITE_OTHER): Payer: 59 | Admitting: Physical Medicine and Rehabilitation

## 2017-12-08 ENCOUNTER — Encounter (INDEPENDENT_AMBULATORY_CARE_PROVIDER_SITE_OTHER): Payer: Self-pay | Admitting: Physical Medicine and Rehabilitation

## 2017-12-08 ENCOUNTER — Ambulatory Visit (INDEPENDENT_AMBULATORY_CARE_PROVIDER_SITE_OTHER): Payer: Self-pay

## 2017-12-08 VITALS — BP 197/98 | HR 100 | Temp 98.0°F

## 2017-12-08 DIAGNOSIS — M47816 Spondylosis without myelopathy or radiculopathy, lumbar region: Secondary | ICD-10-CM | POA: Diagnosis not present

## 2017-12-08 MED ORDER — METHYLPREDNISOLONE ACETATE 80 MG/ML IJ SUSP
80.0000 mg | Freq: Once | INTRAMUSCULAR | Status: AC
  Administered 2017-12-08: 80 mg

## 2017-12-08 NOTE — Patient Instructions (Signed)

## 2017-12-08 NOTE — Progress Notes (Signed)
Dana [name] - 52 y.o. female MRN [id number]  Date of birth: 31-Dec-1965  Office Visit Note: Visit Date: 12/08/2017 PCP: [name], MD Referred by: [name], MD  Subjective: Chief Complaint  Patient presents with  . Lower Back - Pain  . Right Leg - Pain, Numbness   HPI: Dana [name] is a 52 year old female with chronic history of back pain and knee pain.  She takes Dilaudid without much relief.  She is distraught today about her back and right buttock pain.  She reports really not knowing what to do at this point.  She reports that Dr. Louanne Skye says surgery is not done to help her.  She has had prior laminectomy decompression at L2-3.  She is morbidly obese.  She takes 300 mg of gabapentin at night.  She is been followed by Dr. Teresa Coombs as well for orthopedic complaints.  Last injection performed was medial branch blocks of L4-5 which were really ineffectual.  Prior injection of that was intra-articular facet joint block at L4-5 which she says gave her great relief for over a month and it was the best relief she has had.  At Dr. Otho Ket request and Lou Miner assistance request we are going to repeat this injection.  She reports recent incident at the [facility] where she felt a pop and more pain in the right buttock.  Last MRI did not show any disc herniations but she does have facet arthropathy and epidural lipomatosis at L4-5 with a central canal narrowing down to 4 mm.  Depending on the relief with the facet joint block we could intermittently repeat those if they lasted to a point where he only doing 3 or 4 a year.  Unfortunately she has not seen that, relief.  She has an appointment coming up with Dr. Louanne Skye and I would suggest if surgery is not an answer injections really not helping she may be better served in a comprehensive pain management center.  I have also asked her to slowly ramp up the use of the gabapentin.  This was originally prescribed by Dr. Paulla Fore and she was supposed  to taper this up over the course of the few weeks.  She reports that she felt like it started to give her a headache so she quit taking it.  She continues to take the Dilaudid however.  I have asked her again to try to take the gabapentin as I do not think that really gave her the headache it was probably some other issue but if she continues to have a headache that she can discontinue the gabapentin.   ROS Otherwise per HPI.  Assessment & Plan: Visit Diagnoses:  1. Spondylosis without myelopathy or radiculopathy, lumbar region     Plan: No additional findings.   Meds & Orders:  Meds ordered this encounter  Medications  . methylPREDNISolone acetate (DEPO-MEDROL) injection 80 mg    Orders Placed This Encounter  Procedures  . Facet Injection  . XR C-ARM NO REPORT    Follow-up: Return Dr. Louanne Skye.   Procedures: No procedures performed  No notes on file   Clinical History: MRI LUMBAR SPINE WITHOUT CONTRAST  TECHNIQUE: Multiplanar, multisequence MR imaging of the lumbar spine was performed. No intravenous contrast was administered.  COMPARISON:  Lumbar spine radiographs April 05, 2017 MRI of lumbar spine December 09, 2007  FINDINGS: Large body habitus decreases overall signal to noise ratio.  SEGMENTATION: For the purposes of this report, the last well-formed intervertebral disc  will be reported as L5-S1.  ALIGNMENT: Maintained lumbar lordosis. No malalignment.  VERTEBRAE:Vertebral bodies are intact. Severe L1 scratches severe L2-3 disc height loss progressed from prior examination with mild subacute on chronic discogenic endplate changes. Remaining discs demonstrate normal morphology with mild desiccation. No suspicious or acute bone marrow signal in the low lumbar spine. Mild subacute on chronic discogenic endplate changes included thoracic spine. Congenital canal narrowing on the basis of foreshortened pedicles. Epidural lipomatosis.  CONUS MEDULLARIS: Conus  medullaris terminates at L1-2 and demonstrates normal morphology and signal characteristics. T2 artifact lower thoracic spinal cord. Cauda equina is normal.  PARASPINAL AND SOFT TISSUES: Included prevertebral and paraspinal soft tissues are nonacute. Mild symmetric paraspinal muscle atrophy.  DISC LEVELS:  T12-L1, L1-2: No disc bulge, canal stenosis nor neural foraminal narrowing.  L2-3: Small broad-based disc bulge asymmetric to the RIGHT, mild facet arthropathy and ligamentum flavum redundancy. No canal stenosis. Mild RIGHT neural foraminal narrowing.  L3-4: No disc bulge. Moderate facet arthropathy and ligamentum flavum redundancy. No canal stenosis. Epidural lipomatosis. No neural foraminal narrowing.  L4-5: Small broad-based central disc protrusion. Moderate facet arthropathy and ligamentum flavum redundancy with trace facet effusions which are likely reactive. No canal stenosis though, coupled dural lipomatosis narrows the thecal sac to 4 mm in AP dimension. No neural foraminal narrowing.  L5-S1: Small broad-based central disc protrusion. Moderate facet arthropathy. No canal stenosis though, epidural lipomatosis mildly narrows the thecal sac. No neural foraminal narrowing.  IMPRESSION: 1. Degenerative change of the lumbar spine superimposed on congenital canal narrowing and epidural lipomatosis. 2. No osseous canal stenosis though, epidural fat narrows the thecal sac at L4-5 and L5-S1. 3. Mild RIGHT L2-3 neural foraminal narrowing.   Electronically Signed   By: Elon Alas M.D.   On: 06/13/2017 19:06   She reports that she has quit smoking. Her smoking use included cigarettes. She quit after 33.00 years of use. She has never used smokeless tobacco. No results for input(s): HGBA1C, LABURIC in the last 8760 hours.  Objective:  VS:  HT:    WT:   BMI:     BP:(!) 197/98  HR:100bpm  TEMP:98 F (36.7 C)(Oral)  RESP:96 % Physical Exam   Constitutional: She is oriented to person, place, and time.  Musculoskeletal:  Morbidly obese female who ambulates without aid but with antalgic gait more to the right.  She has no pain with hip rotation.  She is really painful with any kind of motion however or even touching around the spine.  Somewhat exaggerated.  She does have pain with extension of the lumbar spine.  Neurological: She is alert and oriented to person, place, and time. She exhibits normal muscle tone.  Psychiatric:  Patient is very anxious and distraught about her pain level and her level of functioning.    Ortho Exam Imaging: Xr C-arm No Report  Result Date: 12/08/2017 Please see Notes or Procedures tab for imaging impression.   Past Medical/Family/Surgical/Social History: Medications & Allergies reviewed per EMR, new medications updated. Patient Active Problem List   Diagnosis Date Noted  . 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 Primary osteoarthritis of right knee 02/27/2017  . Lateral epicondylitis of right elbow 11/11/2016  . Muscle cramps 01/15/2016  . Bilateral leg edema 01/08/2016  . Radiculopathy 12/20/2015  . Hyperglycemia 12/21/2014  . Mastodynia, female 11/30/2014  . S/P excision of fibroadenoma of breast 11/30/2014  . Angioedema of lips 04/07/2014  . Fibroadenoma of right breast 03/07/2014  . Pelvic pain in female  06/19/2012  . Menorrhagia 06/11/2012  . SUI (stress urinary incontinence, female) 06/11/2012  . HTN (hypertension) 06/11/2012  . IBS (irritable bowel syndrome) - diarrhea predominant 03/17/2012  . MICROSCOPIC HEMATURIA 11/30/2010  . BREAST PAIN, RIGHT 11/30/2010  . BREAST MASS, RIGHT 01/12/2010  . HYPERCHOLESTEROLEMIA 12/07/2007  . CIGARETTE SMOKER 12/07/2007  . DEGENERATIVE JOINT DISEASE 12/07/2007  . Low back pain with sciatica 12/07/2007  . HEADACHE 12/07/2007  . Obesity 08/03/2007  . ANXIETY 08/03/2007   Past Medical History:  Diagnosis Date  . Anxiety   . Back  pain with radiation   . Breast mass, right   . Complication of anesthesia 04/07/14  . DJD (degenerative joint disease)   . Groin abscess   . Headache(784.0)   . History of IBS   . Hypercholesterolemia   . IBS (irritable bowel syndrome)   . Lactose intolerance   . Mild hypertension   . Obesity   . Tobacco use disorder    Family History  Problem Relation Age of Onset  . Diabetes Father   . Diabetes Mother   . Prostate cancer Paternal Grandfather   . Breast cancer Maternal Aunt 46   Past Surgical History:  Procedure Laterality Date  . ABDOMINAL HYSTERECTOMY    . ANTERIOR CERVICAL DECOMP/DISCECTOMY FUSION N/A 12/20/2015   Procedure: ANTERIOR CERVICAL DECOMPRESSION FUSION CERVICAL 4-5, CERVICAL 5-6, CERVICAL 6-7 WITH INSTRUMENTATION AND ALLOGRAFT;  Surgeon: Phylliss Bob, MD;  Location: [place];  Service: Orthopedics;  Laterality: N/A;  Anterior cervical decompression fusion, cervical 4-5, cervical 5-6, cervical 6-7 with instrumentation and allograft  . BACK SURGERY    . BILATERAL SALPINGECTOMY  09/03/2012   Procedure: BILATERAL SALPINGECTOMY;  Surgeon: [name], MD;  Location: [facility];  Service: Gynecology;  Laterality: Bilateral;  . BREAST BIOPSY Right 04/07/2014   Procedure: REMOVAL RIGHT BREAST MASS WITH WIRE LOCALIZATION;  Surgeon: [name], MD;  Location: [place];  Service: General;  Laterality: Right;  . BREAST LUMPECTOMY     x2  . COLONOSCOPY W/ BIOPSIES  04/24/2012   per Dr. Carlean Purl, clear, repeat in 10 yrs   . disectomy    . ESOPHAGOGASTRODUODENOSCOPY    . FINGER SURGERY     Right index-excision of mass   . FOOT SURGERY Right    Bone Spurs  . LAPAROSCOPIC HYSTERECTOMY  09/03/2012   Procedure: HYSTERECTOMY TOTAL LAPAROSCOPIC;  Surgeon: [name], MD;  Location: [facility];  Service: Gynecology;  Laterality: N/A;  . LUMBAR [place]    . TUBAL LIGATION     Social History   Occupational History  . Occupation: Optometrist  Tobacco Use  . Smoking  status: Former Smoker    Years: 33.00    Types: Cigarettes  . Smokeless tobacco: Never Used  . Tobacco comment: quit March 2017  Substance and Sexual Activity  . Alcohol use: No    Alcohol/week: 0.0 oz    Comment: occ  . Drug use: No    Types: Amphetamines  . Sexual activity: Yes    Birth control/protection: Surgical    Comment: tubal ligation

## 2017-12-09 NOTE — Procedures (Signed)
Lumbar Facet Joint Intra-Articular Injection(s) with Fluoroscopic Guidance  [name]      Date of Birth: [birth year] MRN: [id number] PCP: [name], MD      Visit Date: 12/08/2017   Universal Protocol:    Date/Time: 12/08/2017  Consent Given By: the patient  Position: PRONE   Additional Comments: Vital signs were monitored before and after the procedure. Patient was prepped and draped in the usual sterile fashion. The correct patient, procedure, and site was verified.   Injection Procedure Details:  Procedure Site One Meds Administered:  Meds ordered this encounter  Medications  . methylPREDNISolone acetate (DEPO-MEDROL) injection 80 mg     Laterality: Bilateral  Location/Site:  L4-L5  Needle size: 22 guage  Needle type: Spinal  Needle Placement: Articular  Findings:  -Comments: Excellent flow of contrast producing a partial arthrogram.  Procedure Details: The fluoroscope beam is vertically oriented in AP, and the inferior recess is visualized beneath the lower pole of the inferior apophyseal process, which represents the target point for needle insertion. When direct visualization is difficult the target point is located at the medial projection of the vertebral pedicle. The region overlying each aforementioned target is locally anesthetized with a 1 to 2 ml. volume of 1% Lidocaine without Epinephrine.   The spinal needle was inserted into each of the above mentioned facet joints using biplanar fluoroscopic guidance. A 0.25 to 0.5 ml. volume of Isovue-250 was injected and a partial facet joint arthrogram was obtained. A single spot film was obtained of the resulting arthrogram.    One to 1.25 ml of the steroid/anesthetic solution was then injected into each of the facet joints noted above.   Additional Comments:  The patient tolerated the procedure well Dressing: Band-Aid    Post-procedure details: Patient was observed during the  procedure. Post-procedure instructions were reviewed.  Patient left the clinic in stable condition.

## 2017-12-09 NOTE — Progress Notes (Signed)
.  Numeric Pain Rating Scale and Functional Assessment Average Pain 10   In the last MONTH (on 0-10 scale) has pain interfered with the following?  1. General activity like being  able to carry out your everyday physical activities such as walking, climbing stairs, carrying groceries, or moving a chair?  Rating(9)   +Driver, -BT, -Dye Allergies.  

## 2017-12-11 ENCOUNTER — Telehealth (INDEPENDENT_AMBULATORY_CARE_PROVIDER_SITE_OTHER): Payer: Self-pay | Admitting: Radiology

## 2017-12-11 ENCOUNTER — Telehealth: Payer: Self-pay | Admitting: Family Medicine

## 2017-12-11 ENCOUNTER — Other Ambulatory Visit: Payer: Self-pay | Admitting: Family Medicine

## 2017-12-11 NOTE — Telephone Encounter (Signed)
Last OV 11/13/2017   Last refilled 10/31/2017 disp 120 with no refills   Sent to PCP for approval

## 2017-12-11 NOTE — Telephone Encounter (Signed)
Copied from [place] [id number]. Topic: Inquiry >> Dec 11, 2017 10:04 AM Pricilla Handler wrote: Reason for CRM: Patient called requesting a refill of HYDROmorphone (DILAUDID) 4 MG tablet. Patient called her pharmacy, and they suggested that the patient call our office. Patient's preferred pharmacy is  [id number] [address], [place] [phone] (Phone)   [phone] (Fax).       Thank You!!!

## 2017-12-11 NOTE — Telephone Encounter (Signed)
-----   Message from Sherre Scarlet, [place] sent at 12/08/2017  3:28 PM EDT ----- Regarding: Patient requests call Patient had a facet injection today, and she wanted to speak with you. She asked if you could give her a call at [phone]- she said you know what it is about.

## 2017-12-11 NOTE — Telephone Encounter (Signed)
Copied from [place] [id number]. Topic: Inquiry >> Dec 11, 2017 10:04 AM Pricilla Handler wrote: Reason for CRM: Patient called requesting a refill of HYDROmorphone (DILAUDID) 4 MG tablet. Patient called her pharmacy, and they suggested that the patient call our office. Patient's preferred pharmacy is  [id number] [address], [place] [phone] (Phone)   [phone] (Fax).       Thank You!!!  This is duplicate note, see previous one.

## 2017-12-11 NOTE — Telephone Encounter (Signed)
Hydromorphone (Dilaudid) refill Last OV: 04/14/15 Last Refill:10/31/17 120 tablets No RF Pharmacy:[facility] PCP: Dr. [name]

## 2017-12-11 NOTE — Telephone Encounter (Signed)
I called and advised patient that he has the message in his box

## 2017-12-12 NOTE — Telephone Encounter (Signed)
Called pt and left a VM that she will need to schedule for a PMV in order for Dr. Sarajane Jews to refill Rx.

## 2017-12-12 NOTE — Telephone Encounter (Signed)
She needs a PMV for this  

## 2017-12-12 NOTE — Telephone Encounter (Signed)
She will need a PMV for this

## 2017-12-15 ENCOUNTER — Encounter: Payer: Self-pay | Admitting: Family Medicine

## 2017-12-15 ENCOUNTER — Ambulatory Visit (INDEPENDENT_AMBULATORY_CARE_PROVIDER_SITE_OTHER): Payer: 59 | Admitting: Family Medicine

## 2017-12-15 ENCOUNTER — Other Ambulatory Visit: Payer: Self-pay | Admitting: Family Medicine

## 2017-12-15 VITALS — BP 182/110 | HR 88 | Temp 98.6°F | Ht 63.0 in | Wt 290.2 lb

## 2017-12-15 DIAGNOSIS — I10 Essential (primary) hypertension: Secondary | ICD-10-CM | POA: Diagnosis not present

## 2017-12-15 DIAGNOSIS — R600 Localized edema: Secondary | ICD-10-CM

## 2017-12-15 DIAGNOSIS — G8929 Other chronic pain: Secondary | ICD-10-CM

## 2017-12-15 DIAGNOSIS — M5440 Lumbago with sciatica, unspecified side: Secondary | ICD-10-CM | POA: Diagnosis not present

## 2017-12-15 DIAGNOSIS — F1190 Opioid use, unspecified, uncomplicated: Secondary | ICD-10-CM | POA: Diagnosis not present

## 2017-12-15 MED ORDER — HYDROMORPHONE HCL 4 MG PO TABS: 4.0000 mg | ORAL_TABLET | Freq: Four times a day (QID) | ORAL | 0 refills | Status: DC | PRN

## 2017-12-15 MED ORDER — HYDROMORPHONE HCL 4 MG PO TABS: 4.0000 mg | ORAL_TABLET | Freq: Four times a day (QID) | ORAL | 0 refills | Status: AC | PRN

## 2017-12-15 MED ORDER — HYDRALAZINE HCL 25 MG PO TABS: 25.0000 mg | ORAL_TABLET | Freq: Three times a day (TID) | ORAL | 5 refills | Status: DC

## 2017-12-15 MED ORDER — FUROSEMIDE 40 MG PO TABS: ORAL_TABLET | ORAL | 5 refills | Status: DC

## 2017-12-15 NOTE — Progress Notes (Signed)
   Subjective:    Patient ID: Dana [name], female    DOB: 23-Nov-1965, 52 y.o.   MRN: [id number]  HPI Here for pain management and also to check her BP. Her back pain has still been a problem. She sees Dr. Louanne Skye for orthopedic are and she recently had 3 steroid injections to the spine with Dr. Lucia Gaskins, his partner. Her BP remains quite high, and she has daily swelling in the feet and legs. No SOB.  Indication for chronic opioid: low back pain  Medication and dose: Dilaudid 4 mg # pills per month: 120 Last UDS date: 12-15-17 Opioid Treatment Agreement signed (Y/N): 12-15-17 Opioid Treatment Agreement last reviewed with patient:  12-15-17 [place] reviewed this encounter (include red flags):  12-15-17   Review of Systems  Constitutional: Negative.   Respiratory: Negative.   Cardiovascular: Positive for leg swelling. Negative for chest pain and palpitations.  Musculoskeletal: Positive for back pain.  Neurological: Negative.        Objective:   Physical Exam  Constitutional: She is oriented to person, place, and time. She appears well-developed and well-nourished.  Cardiovascular: Normal rate, regular rhythm, normal heart sounds and intact distal pulses.  Pulmonary/Chest: Effort normal and breath sounds normal. No respiratory distress. She has no wheezes. She has no rales.  Musculoskeletal:  2+ edema in both lower legs.   Neurological: She is alert and oriented to person, place, and time.          Assessment & Plan:  For pain management, her meds are refilled. Get a UDS today. For the swelling and HTN, we will increase the Lasix to 2 tabs in the am and 1 tab in the pm. Also add Hydralazine 25 mg tid.  [name], MD

## 2017-12-16 NOTE — Telephone Encounter (Signed)
Last OV 12/15/2017   Last refilled 07/17/2017 disp 90 with 2 refills   Sent to PCP for approval

## 2017-12-18 LAB — PAIN MGMT, PROFILE 8 W/CONF, U
6 Acetylmorphine: NEGATIVE ng/mL (ref <10)
ALCOHOL METABOLITES: POSITIVE ng/mL — AB (ref <500)
ALPHAHYDROXYALPRAZOLAM: 68 ng/mL — AB (ref <25)
Alphahydroxymidazolam: NEGATIVE ng/mL (ref <50)
Alphahydroxytriazolam: NEGATIVE ng/mL (ref <50)
Aminoclonazepam: NEGATIVE ng/mL (ref <25)
Amphetamines: NEGATIVE ng/mL (ref <500)
BENZODIAZEPINES: POSITIVE ng/mL — AB (ref <100)
Buprenorphine, Urine: NEGATIVE ng/mL (ref <5)
COCAINE METABOLITE: NEGATIVE ng/mL (ref <150)
CODEINE: NEGATIVE ng/mL (ref <50)
CREATININE: 126.9 mg/dL
ETHYL GLUCURONIDE (ETG): 112943 ng/mL — AB (ref <500)
Ethyl Sulfate (ETS): 22433 ng/mL — ABNORMAL HIGH (ref <100)
HYDROMORPHONE: 1437 ng/mL — AB (ref <50)
Hydrocodone: 159 ng/mL — ABNORMAL HIGH (ref <50)
Hydroxyethylflurazepam: NEGATIVE ng/mL (ref <50)
LORAZEPAM: NEGATIVE ng/mL (ref <50)
MDMA: NEGATIVE ng/mL (ref <500)
MORPHINE: NEGATIVE ng/mL (ref <50)
Marijuana Metabolite: NEGATIVE ng/mL (ref <20)
NORHYDROCODONE: 84 ng/mL — AB (ref <50)
Nordiazepam: NEGATIVE ng/mL (ref <50)
OXYCODONE: NEGATIVE ng/mL (ref <100)
Opiates: POSITIVE ng/mL — AB (ref <100)
Oxazepam: NEGATIVE ng/mL (ref <50)
Oxidant: NEGATIVE ug/mL (ref <200)
TEMAZEPAM: NEGATIVE ng/mL (ref <50)
pH: 6.94 (ref 4.5–9.0)

## 2017-12-24 ENCOUNTER — Ambulatory Visit (INDEPENDENT_AMBULATORY_CARE_PROVIDER_SITE_OTHER): Payer: 59 | Admitting: Specialist

## 2017-12-24 ENCOUNTER — Encounter (INDEPENDENT_AMBULATORY_CARE_PROVIDER_SITE_OTHER): Payer: Self-pay | Admitting: Specialist

## 2017-12-24 VITALS — BP 160/77 | HR 99 | Ht 63.0 in | Wt 290.0 lb

## 2017-12-24 DIAGNOSIS — G8929 Other chronic pain: Secondary | ICD-10-CM

## 2017-12-24 DIAGNOSIS — Z981 Arthrodesis status: Secondary | ICD-10-CM

## 2017-12-24 DIAGNOSIS — M5136 Other intervertebral disc degeneration, lumbar region: Secondary | ICD-10-CM

## 2017-12-24 NOTE — Patient Instructions (Signed)
Avoid frequent bending and stooping  No lifting greater than 5-10 lbs. May use ice or moist heat for pain. Weight loss is of benefit. Handicap license is approved.Avoid overhead lifting and overhead use of the arms. Do not lift greater than 5-10 lbs. Adjust head rest in vehicle to prevent hyperextension if rear ended. Take extra precautions to avoid falling, including use of a cane if you feel weak.

## 2017-12-24 NOTE — Progress Notes (Signed)
Office Visit Note   [name]           Date of Birth: 23-Apr-1966           MRN: [id number] Visit Date: 12/24/2017              Requested by: [name], MD [place], [place] [address] PCP: [name], MD   Assessment & Plan: Visit Diagnoses:  1. Other intervertebral disc degeneration, lumbar region   2. Status post cervical spinal fusion   3. Other chronic pain     Plan: Avoid frequent bending and stooping  No lifting greater than 5-10 lbs. May use ice or moist heat for pain. Weight loss is of benefit. Handicap license is approved.Avoid overhead lifting and overhead use of the arms. Do not lift greater than 5-10 lbs. Adjust head rest in vehicle to prevent hyperextension if rear ended. Take extra precautions to avoid falling, including use of a cane if you feel weak.  Follow-Up Instructions: No follow-ups on file.   Orders:  No orders of the defined types were placed in this encounter.  No orders of the defined types were placed in this encounter.     Procedures: No procedures performed   Clinical Data: No additional findings.   Subjective: Chief Complaint  Patient presents with  . Lower Back - Follow-up    She had Bilateral L4-5 Facet injection with Dr. Ernestina Patches on 12/08/17.  She states that she has got about 50% Relief with it.    52 year old female with severe episode of pain into the right buttock with radiation into the right leg laterally into the right foot. She went to church any way, but the next day she was having trouble even walking to the rest room. By Thursday the pain was getting better. She had an appointment to be seen. No bowel or bladder difficulty. She had difficulty with bending and stooping and sitting. Husband was frustrated back or leg pain and at the time the pain was in the right leg. Stiffness now. She hasn't been able to work in 3 years. She has difficulty with her chores at home, washing dishes  and laundry. She was able to cook yesterday but not when her back was out. She had injection by Dr. Ernestina Patches and this one is helping. The injection was bilateral TF ESI L4-5 and she is receiving medications for pain control with Dr. [name], dilaudid 4mg  120 per month. She is undergoing disability appeal with a lawyer  Assisting.      Review of Systems  Constitutional: Negative.   HENT: Negative.   Eyes: Negative.   Respiratory: Negative.   Cardiovascular: Negative.   Gastrointestinal: Negative.   Endocrine: Negative.   Genitourinary: Negative.   Musculoskeletal: Negative.   Skin: Negative.   Allergic/Immunologic: Negative.   Neurological: Negative.   Hematological: Negative.   Psychiatric/Behavioral: Negative.      Objective: Vital Signs: BP (!) 160/77 (BP Location: Left Arm, Patient Position: Sitting)   Pulse 99   Ht 5\' 3"  (1.6 m)   Wt 290 lb (131.5 kg)   LMP 08/17/2012   BMI 51.37 kg/m   Physical Exam  Constitutional: She is oriented to person, place, and time. She appears well-developed and well-nourished.  HENT:  Head: Normocephalic and atraumatic.  Eyes: Pupils are equal, round, and reactive to light. EOM are normal.  Neck: Normal range of motion. Neck supple.  Pulmonary/Chest: Effort normal and breath  sounds normal.  Abdominal: Soft. Bowel sounds are normal.  Neurological: She is alert and oriented to person, place, and time.  Skin: Skin is warm and dry.  Psychiatric: She has a normal mood and affect. Her behavior is normal. Judgment and thought content normal.    Back Exam   Tenderness  The patient is experiencing tenderness in the lumbar and cervical.  Range of Motion  Extension: abnormal  Flexion: abnormal  Lateral bend right: abnormal  Lateral bend left: abnormal  Rotation right: abnormal  Rotation left: abnormal   Muscle Strength  The patient has normal back strength. Right Quadriceps:  5/5  Left Quadriceps:  5/5  Right Hamstrings:  5/5    Left Hamstrings:  5/5   Tests  Straight leg raise right: negative Straight leg raise left: negative  Reflexes  Patellar: normal Achilles: normal Biceps: normal Babinski's sign: normal   Other  Toe walk: normal Heel walk: normal Sensation: normal Gait: normal  Erythema: no back redness Scars: absent      Specialty Comments:  No specialty comments available.  Imaging: No results found.   PMFS History: Patient Active Problem List   Diagnosis Date Noted  . 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 Primary osteoarthritis of right knee 02/27/2017  . Lateral epicondylitis of right elbow 11/11/2016  . Muscle cramps 01/15/2016  . Bilateral leg edema 01/08/2016  . Radiculopathy 12/20/2015  . Hyperglycemia 12/21/2014  . Mastodynia, female 11/30/2014  . S/P excision of fibroadenoma of breast 11/30/2014  . Angioedema of lips 04/07/2014  . Fibroadenoma of right breast 03/07/2014  . Pelvic pain in female 06/19/2012  . Menorrhagia 06/11/2012  . SUI (stress urinary incontinence, female) 06/11/2012  . HTN (hypertension) 06/11/2012  . IBS (irritable bowel syndrome) - diarrhea predominant 03/17/2012  . MICROSCOPIC HEMATURIA 11/30/2010  . BREAST PAIN, RIGHT 11/30/2010  . BREAST MASS, RIGHT 01/12/2010  . HYPERCHOLESTEROLEMIA 12/07/2007  . CIGARETTE SMOKER 12/07/2007  . DEGENERATIVE JOINT DISEASE 12/07/2007  . Low back pain with sciatica 12/07/2007  . HEADACHE 12/07/2007  . Obesity 08/03/2007  . ANXIETY 08/03/2007   Past Medical History:  Diagnosis Date  . Anxiety   . Back pain with radiation   . Breast mass, right   . Complication of anesthesia 04/07/14  . DJD (degenerative joint disease)   . Groin abscess   . Headache(784.0)   . History of IBS   . Hypercholesterolemia   . IBS (irritable bowel syndrome)   . Lactose intolerance   . Mild hypertension   . Obesity   . Tobacco use disorder     Family History  Problem Relation Age of Onset  . Diabetes Father    . Diabetes Mother   . Prostate cancer Paternal Grandfather   . Breast cancer Maternal Aunt 46    Past Surgical History:  Procedure Laterality Date  . ABDOMINAL HYSTERECTOMY    . ANTERIOR CERVICAL DECOMP/DISCECTOMY FUSION N/A 12/20/2015   Procedure: ANTERIOR CERVICAL DECOMPRESSION FUSION CERVICAL 4-5, CERVICAL 5-6, CERVICAL 6-7 WITH INSTRUMENTATION AND ALLOGRAFT;  Surgeon: Phylliss Bob, MD;  Location: [facility];  Service: Orthopedics;  Laterality: N/A;  Anterior cervical decompression fusion, cervical 4-5, cervical 5-6, cervical 6-7 with instrumentation and allograft  . BACK SURGERY    . BILATERAL SALPINGECTOMY  09/03/2012   Procedure: BILATERAL SALPINGECTOMY;  Surgeon: [name], MD;  Location: [facility];  Service: Gynecology;  Laterality: Bilateral;  . BREAST BIOPSY Right 04/07/2014   Procedure: REMOVAL RIGHT BREAST MASS WITH WIRE LOCALIZATION;  Surgeon: Sherren Mocha  Adalberto Cole, MD;  Location: [facility];  Service: General;  Laterality: Right;  . BREAST LUMPECTOMY     x2  . COLONOSCOPY W/ BIOPSIES  04/24/2012   per Dr. Carlean Purl, clear, repeat in 10 yrs   . disectomy    . ESOPHAGOGASTRODUODENOSCOPY    . FINGER SURGERY     Right index-excision of mass   . FOOT SURGERY Right    Bone Spurs  . LAPAROSCOPIC HYSTERECTOMY  09/03/2012   Procedure: HYSTERECTOMY TOTAL LAPAROSCOPIC;  Surgeon: [name], MD;  Location: [facility];  Service: Gynecology;  Laterality: N/A;  . LUMBAR [place]    . TUBAL LIGATION     Social History   Occupational History  . Occupation: Optometrist  Tobacco Use  . Smoking status: Former Smoker    Years: 33.00    Types: Cigarettes  . Smokeless tobacco: Never Used  . Tobacco comment: quit March 2017  Substance and Sexual Activity  . Alcohol use: No    Alcohol/week: 0.0 oz    Comment: occ  . Drug use: No    Types: Amphetamines  . Sexual activity: Yes    Birth control/protection: Surgical    Comment: tubal ligation

## 2018-01-01 DIAGNOSIS — H401132 Primary open-angle glaucoma, bilateral, moderate stage: Secondary | ICD-10-CM | POA: Diagnosis not present

## 2018-01-29 ENCOUNTER — Telehealth (INDEPENDENT_AMBULATORY_CARE_PROVIDER_SITE_OTHER): Payer: Self-pay

## 2018-01-29 NOTE — Telephone Encounter (Signed)
The number for this patient, per [employer] has been changed or disconnected.  I can not reasch patient at this time.  I will continue to try to reach patient

## 2018-01-29 NOTE — Telephone Encounter (Signed)
Pt calling states that she needs an appt "I can hardly walk" the first thing dr. Louanne Skye has is not until 6/13 she states she can not wait that long. I see that Jeneen Rinks saw her in February and he said to follow up with Dr. Louanne Skye to discuss surgery. Did not know if I should try and set up appt with him. Advised pt I would send message to you so that you can advise.

## 2018-02-03 ENCOUNTER — Ambulatory Visit (INDEPENDENT_AMBULATORY_CARE_PROVIDER_SITE_OTHER): Payer: 59 | Admitting: Family Medicine

## 2018-02-03 ENCOUNTER — Ambulatory Visit: Payer: Self-pay | Admitting: *Deleted

## 2018-02-03 ENCOUNTER — Encounter: Payer: Self-pay | Admitting: Family Medicine

## 2018-02-03 VITALS — BP 128/90 | HR 91 | Temp 98.2°F | Ht 63.0 in | Wt 283.8 lb

## 2018-02-03 DIAGNOSIS — J029 Acute pharyngitis, unspecified: Secondary | ICD-10-CM

## 2018-02-03 DIAGNOSIS — K429 Umbilical hernia without obstruction or gangrene: Secondary | ICD-10-CM

## 2018-02-03 DIAGNOSIS — J019 Acute sinusitis, unspecified: Secondary | ICD-10-CM

## 2018-02-03 LAB — POCT RAPID STREP A (OFFICE): Rapid Strep A Screen: NEGATIVE

## 2018-02-03 MED ORDER — LEVOFLOXACIN 500 MG PO TABS: 500.0000 mg | ORAL_TABLET | Freq: Every day | ORAL | 0 refills | Status: AC

## 2018-02-03 MED ORDER — ONDANSETRON HCL 8 MG PO TABS: 8.0000 mg | ORAL_TABLET | Freq: Three times a day (TID) | ORAL | 0 refills | Status: DC | PRN

## 2018-02-03 NOTE — Telephone Encounter (Signed)
Patient  Calls with severe sore throat since Friday night. She believes she is febrile but has not measured temperature. Reports having a runny nose, thick brownish phlegm (she has been on mucinex cold and sinus medication) H/A when she bends over. Reported she isn't drinking or eating much at this time. She has a son who is on hemodialysis and is concerned she is exposing him to something. She has a 2:00 appointment. Reason for Disposition . SEVERE (e.g., excruciating) throat pain  Answer Assessment - Initial Assessment Questions 1. ONSET: "When did the throat start hurting?" (Hours or days ago)      Today is day 4  2. SEVERITY: "How bad is the sore throat?" (Scale 1-10; mild, moderate or severe)   - MILD (1-3):  doesn't interfere with eating or normal activities   - MODERATE (4-7): interferes with eating some solids and normal activities   - SEVERE (8-10):  excruciating pain, interferes with most normal activities   - SEVERE DYSPHAGIA: can't swallow liquids, drooling     9, unable to swallow. 3. STREP EXPOSURE: "Has there been any exposure to strep within the past week?" If so, ask: "What type of contact occurred?"      Been around her grandson, unsure if he had strep 4.  VIRAL SYMPTOMS: "Are there any symptoms of a cold, such as a runny nose, cough, hoarse voice or red eyes?"      Runny nose, hoarse voice. Phlegm is thick dark brownish color. Rt eye with matter and closed up this morning.  5. FEVER: "Do you have a fever?" If so, ask: "What is your temperature, how was it measured, and when did it start?"     Thinks she has fever today. She is warm to touch she stated.  6. PUS ON THE TONSILS: "Is there pus on the tonsils in the back of your throat?"     Unable to open mouth wide enough to see tonsils.  7. OTHER SYMPTOMS: "Do you have any other symptoms?" (e.g., difficulty breathing, headache, rash)     Headache with bending down for the last 3 days.  8. PREGNANCY: "Is there any chance you  are pregnant?" "When was your last menstrual period?"     no  Protocols used: SORE THROAT-A-AH

## 2018-02-03 NOTE — Progress Notes (Signed)
   Subjective:    Patient ID: Dana [name], female    DOB: 17-Feb-1966, 52 y.o.   MRN: [id number]  HPI Here for 2 issues. First 2 weeks ago she noticed a painful lump above the belly button. BMs are normal. Also for 5 days she has had sinus pressure, headache, PND, ST, nausea and a dry cough. Drinking fluids.   Review of Systems  Constitutional: Negative.   HENT: Positive for congestion, postnasal drip, sinus pressure and sore throat. Negative for sinus pain.   Eyes: Negative.   Respiratory: Positive for cough.   Gastrointestinal: Positive for abdominal pain and nausea. Negative for abdominal distention, anal bleeding, blood in stool, constipation, diarrhea, rectal pain and vomiting.       Objective:   Physical Exam  Constitutional: She appears well-developed and well-nourished.  HENT:  Right Ear: External ear normal.  Left Ear: External ear normal.  Nose: Nose normal.  Mouth/Throat: Oropharynx is clear and moist.  Eyes: Conjunctivae are normal.  Neck: No thyromegaly present.  Pulmonary/Chest: Effort normal and breath sounds normal. No stridor. No respiratory distress. She has no wheezes. She has no rales.  Abdominal: Soft. Bowel sounds are normal. She exhibits no distension. There is no rebound and no guarding.  Small firm tender but reducible hernia at the superior edge of the navel   Lymphadenopathy:    She has no cervical adenopathy.          Assessment & Plan:  She has a sinusitis which we will treat with Levaquin. Use Zofran for nausea. She has an early umbilical hernia so we wil refer her to Surgery.  [name], MD

## 2018-02-04 ENCOUNTER — Telehealth: Payer: Self-pay | Admitting: Family Medicine

## 2018-02-04 NOTE — Telephone Encounter (Signed)
I spoke with pt, referral was faxed over today to [facility].

## 2018-02-04 NOTE — Telephone Encounter (Signed)
Copied from [place] [id number]. Topic: Referral - Status >> Feb 04, 2018  4:23 PM Cleaster Corin, NT wrote: Reason for CRM: pt. Calling to check on the status of referral request to [facility] pt. Can be reached at [phone]

## 2018-02-10 NOTE — Telephone Encounter (Signed)
I worked her in on 02/12/18

## 2018-02-12 ENCOUNTER — Encounter (INDEPENDENT_AMBULATORY_CARE_PROVIDER_SITE_OTHER): Payer: Self-pay

## 2018-02-12 ENCOUNTER — Ambulatory Visit (INDEPENDENT_AMBULATORY_CARE_PROVIDER_SITE_OTHER): Payer: 59 | Admitting: Surgery

## 2018-02-12 ENCOUNTER — Ambulatory Visit (INDEPENDENT_AMBULATORY_CARE_PROVIDER_SITE_OTHER): Payer: 59 | Admitting: Specialist

## 2018-02-12 ENCOUNTER — Encounter (INDEPENDENT_AMBULATORY_CARE_PROVIDER_SITE_OTHER): Payer: Self-pay | Admitting: Specialist

## 2018-02-12 ENCOUNTER — Telehealth (INDEPENDENT_AMBULATORY_CARE_PROVIDER_SITE_OTHER): Payer: Self-pay

## 2018-02-12 VITALS — BP 128/90 | HR 81 | Ht 63.0 in | Wt 284.0 lb

## 2018-02-12 DIAGNOSIS — Z5321 Procedure and treatment not carried out due to patient leaving prior to being seen by health care provider: Secondary | ICD-10-CM

## 2018-02-12 NOTE — Telephone Encounter (Signed)
Talked with patient and advised her that she would need to have another MRI done for her Lumbar Spine per Benjiman Core being that she had received 3 injections in her back.  Patient stated that she did not want another MRI done and decided to wait and see Dr. Louanne Skye.   Would like to be put on the cancellation list.  No appt.scheduled?  Cb# is [phone].  Thank You.

## 2018-02-12 NOTE — Telephone Encounter (Signed)
I put pt on cancellation list

## 2018-02-17 DIAGNOSIS — K439 Ventral hernia without obstruction or gangrene: Secondary | ICD-10-CM | POA: Diagnosis not present

## 2018-02-23 ENCOUNTER — Other Ambulatory Visit: Payer: Self-pay | Admitting: General Surgery

## 2018-02-23 DIAGNOSIS — K439 Ventral hernia without obstruction or gangrene: Secondary | ICD-10-CM

## 2018-02-26 ENCOUNTER — Ambulatory Visit
Admission: RE | Admit: 2018-02-26 | Discharge: 2018-02-26 | Disposition: A | Discharge status: Home / Self Care | Payer: 59 | Source: Ambulatory Visit | Attending: General Surgery | Admitting: General Surgery

## 2018-02-26 DIAGNOSIS — K439 Ventral hernia without obstruction or gangrene: Secondary | ICD-10-CM | POA: Diagnosis not present

## 2018-02-26 MED ORDER — IOPAMIDOL (ISOVUE-300) INJECTION 61%
125.0000 mL | Freq: Once | INTRAVENOUS | Status: AC | PRN
  Administered 2018-02-26: 125 mL via INTRAVENOUS

## 2018-03-09 ENCOUNTER — Ambulatory Visit: Payer: Self-pay | Admitting: General Surgery

## 2018-03-09 DIAGNOSIS — K429 Umbilical hernia without obstruction or gangrene: Secondary | ICD-10-CM | POA: Diagnosis not present

## 2018-03-10 ENCOUNTER — Encounter (INDEPENDENT_AMBULATORY_CARE_PROVIDER_SITE_OTHER): Payer: Self-pay | Admitting: Specialist

## 2018-03-10 ENCOUNTER — Ambulatory Visit (INDEPENDENT_AMBULATORY_CARE_PROVIDER_SITE_OTHER): Payer: Self-pay

## 2018-03-10 ENCOUNTER — Ambulatory Visit (INDEPENDENT_AMBULATORY_CARE_PROVIDER_SITE_OTHER): Payer: 59 | Admitting: Specialist

## 2018-03-10 VITALS — BP 163/97 | HR 99 | Ht 63.0 in | Wt 290.0 lb

## 2018-03-10 DIAGNOSIS — M5136 Other intervertebral disc degeneration, lumbar region: Secondary | ICD-10-CM

## 2018-03-10 DIAGNOSIS — M1711 Unilateral primary osteoarthritis, right knee: Secondary | ICD-10-CM | POA: Diagnosis not present

## 2018-03-10 DIAGNOSIS — M7061 Trochanteric bursitis, right hip: Secondary | ICD-10-CM | POA: Diagnosis not present

## 2018-03-10 DIAGNOSIS — M51369 Other intervertebral disc degeneration, lumbar region without mention of lumbar back pain or lower extremity pain: Secondary | ICD-10-CM

## 2018-03-10 MED ORDER — METHYLPREDNISOLONE ACETATE 40 MG/ML IJ SUSP
80.0000 mg | INTRAMUSCULAR | Status: AC | PRN
  Administered 2018-03-10: 80 mg

## 2018-03-10 MED ORDER — LIDOCAINE HCL 1 % IJ SOLN
3.0000 mL | INTRAMUSCULAR | Status: AC | PRN
  Administered 2018-03-10: 3 mL

## 2018-03-10 MED ORDER — BUPIVACAINE HCL 0.25 % IJ SOLN
6.0000 mL | INTRAMUSCULAR | Status: AC | PRN
  Administered 2018-03-10: 6 mL via INTRA_ARTICULAR

## 2018-03-10 NOTE — Progress Notes (Signed)
Office Visit Note   [name]           Date of Birth: [birth year]           MRN: [id number] Visit Date: 03/10/2018              Requested by: [name], MD [place], [place] [address] PCP: [name], MD   Assessment & Plan: Visit Diagnoses:  1. Degenerative disc disease, lumbar   2. Arthritis of right knee   3. Greater trochanteric bursitis, right     Plan: Since patient's main complaint today is with right knee pain I offered injection there.  At patient sent right knee was prepped with Betadine and intra-articular Marcaine/Depo-Medrol injection was performed.  After sitting for a few minutes patient did report very good relief with Marcaine in place.  Patient will follow me in 4 weeks for recheck.  If she continues to have ongoing right lateral hip pain from bursitis she will come in sooner and I will plan to do a greater trochanter bursa injection.  I do not think further imaging studies of her back are indicated at this point but if she still continues to have increased pain there we may need to consider repeating lumbar MRI and compared to study that was done September 2018.  Follow-Up Instructions: Return in about 1 month (around 04/07/2018) for with james.   Orders:  Orders Placed This Encounter  Procedures  . XR Lumbar Spine 2-3 Views   No orders of the defined types were placed in this encounter.     Procedures: Large Joint Inj: R knee on 03/10/2018 2:12 PM Indications: pain and joint swelling Details: 25 G 1.5 in needle, anteromedial approach Medications: 3 mL lidocaine 1 %; 6 mL bupivacaine 0.25 %; 80 mg methylPREDNISolone acetate 40 MG/ML Consent was given by the patient. Patient was prepped and draped in the usual sterile fashion.       Clinical Data: No additional findings.   Subjective: Chief Complaint  Patient presents with  . Lower Back - Pain    HPI Patient comes in today with complaints of back pain,  right lateral hip and thigh pain and right knee pain.  Patient has known history of lumbar spondylosis.  She has had lumbar facet and transforaminal injections by Dr. Ernestina Patches.  She comes in today complaining of back pain but mostly right posterior knee pain and right lateral hip pain.  She has known history of end-stage DJD right knee and is followed by Dr. Erlinda Hong for this.  Has had injections in the past.  Has not had Visco injections.  Pain in the knee and lateral hip with ambulation.  Most recent lumbar MRI September 2018. Review of Systems No current cardiac pulmonary GI GU issues  Objective: Vital Signs: BP (!) 163/97 (BP Location: Left Arm, Patient Position: Sitting)   Pulse 99   Ht 5\' 3"  (1.6 m)   Wt 290 lb (131.5 kg)   LMP 08/17/2012   BMI 51.37 kg/m   Physical Exam  Constitutional: She is oriented to person, place, and time. No distress.  HENT:  Head: Normocephalic and atraumatic.  Eyes: Pupils are equal, round, and reactive to light. EOM are normal.  Pulmonary/Chest: No respiratory distress.  Musculoskeletal:  Gait is somewhat antalgic.  Lumbar paraspinal tenderness.  Negative logroll bilateral hips.  Negative straight leg raise.  She has marked tenderness over the right hip greater trochanter bursa.  Less tender  on the left side.  Right knee positive crepitus.  Marked joint line tenderness.  Posterior knee swelling and tenderness with Baker's cyst.  Calf nontender.  Neurovascular intact.  Neurological: She is alert and oriented to person, place, and time.  Skin: Skin is warm and dry.    Ortho Exam  Specialty Comments:  No specialty comments available.  Imaging: No results found.   PMFS History: Patient Active Problem List   Diagnosis Date Noted  . 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 Primary osteoarthritis of right knee 02/27/2017  . Lateral epicondylitis of right elbow 11/11/2016  . Muscle cramps 01/15/2016  . Bilateral leg edema 01/08/2016  . Radiculopathy  12/20/2015  . Hyperglycemia 12/21/2014  . Mastodynia, female 11/30/2014  . S/P excision of fibroadenoma of breast 11/30/2014  . Angioedema of lips 04/07/2014  . Fibroadenoma of right breast 03/07/2014  . Pelvic pain in female 06/19/2012  . Menorrhagia 06/11/2012  . SUI (stress urinary incontinence, female) 06/11/2012  . HTN (hypertension) 06/11/2012  . IBS (irritable bowel syndrome) - diarrhea predominant 03/17/2012  . MICROSCOPIC HEMATURIA 11/30/2010  . BREAST PAIN, RIGHT 11/30/2010  . BREAST MASS, RIGHT 01/12/2010  . HYPERCHOLESTEROLEMIA 12/07/2007  . CIGARETTE SMOKER 12/07/2007  . DEGENERATIVE JOINT DISEASE 12/07/2007  . Low back pain with sciatica 12/07/2007  . HEADACHE 12/07/2007  . Obesity 08/03/2007  . ANXIETY 08/03/2007   Past Medical History:  Diagnosis Date  . Anxiety   . Back pain with radiation   . Breast mass, right   . Complication of anesthesia 04/07/14  . DJD (degenerative joint disease)   . Groin abscess   . Headache(784.0)   . History of IBS   . Hypercholesterolemia   . IBS (irritable bowel syndrome)   . Lactose intolerance   . Mild hypertension   . Obesity   . Tobacco use disorder     Family History  Problem Relation Age of Onset  . Diabetes Father   . Diabetes Mother   . Prostate cancer Paternal Grandfather   . Breast cancer Maternal Aunt 46    Past Surgical History:  Procedure Laterality Date  . ABDOMINAL HYSTERECTOMY    . ANTERIOR CERVICAL DECOMP/DISCECTOMY FUSION N/A 12/20/2015   Procedure: ANTERIOR CERVICAL DECOMPRESSION FUSION CERVICAL 4-5, CERVICAL 5-6, CERVICAL 6-7 WITH INSTRUMENTATION AND ALLOGRAFT;  Surgeon: Phylliss Bob, MD;  Location: [facility];  Service: Orthopedics;  Laterality: N/A;  Anterior cervical decompression fusion, cervical 4-5, cervical 5-6, cervical 6-7 with instrumentation and allograft  . BACK SURGERY    . BILATERAL SALPINGECTOMY  09/03/2012   Procedure: BILATERAL SALPINGECTOMY;  Surgeon: [name], MD;  Location: [facility];  Service: Gynecology;  Laterality: Bilateral;  . BREAST BIOPSY Right 04/07/2014   Procedure: REMOVAL RIGHT BREAST MASS WITH WIRE LOCALIZATION;  Surgeon: [name], MD;  Location: [facility];  Service: General;  Laterality: Right;  . BREAST LUMPECTOMY     x2  . COLONOSCOPY W/ BIOPSIES  04/24/2012   per Dr. Carlean Purl, clear, repeat in 10 yrs   . disectomy    . ESOPHAGOGASTRODUODENOSCOPY    . FINGER SURGERY     Right index-excision of mass   . FOOT SURGERY Right    Bone Spurs  . LAPAROSCOPIC HYSTERECTOMY  09/03/2012   Procedure: HYSTERECTOMY TOTAL LAPAROSCOPIC;  Surgeon: [name], MD;  Location: [facility];  Service: Gynecology;  Laterality: N/A;  . LUMBAR [place]    . TUBAL LIGATION     Social History   Occupational History  .  Occupation: Optometrist  Tobacco Use  . Smoking status: Former Smoker    Years: 33.00    Types: Cigarettes  . Smokeless tobacco: Never Used  . Tobacco comment: quit March 2017  Substance and Sexual Activity  . Alcohol use: No    Alcohol/week: 0.0 oz    Comment: occ  . Drug use: No    Types: Amphetamines  . Sexual activity: Yes    Birth control/protection: Surgical    Comment: tubal ligation

## 2018-03-18 ENCOUNTER — Ambulatory Visit (INDEPENDENT_AMBULATORY_CARE_PROVIDER_SITE_OTHER): Payer: 59 | Admitting: Surgery

## 2018-03-20 NOTE — Progress Notes (Addendum)
Visit was cancelled Rescheduled for 03/10/2018.

## 2018-03-24 ENCOUNTER — Telehealth: Payer: Self-pay | Admitting: Family Medicine

## 2018-03-24 ENCOUNTER — Ambulatory Visit: Payer: 59 | Admitting: Family Medicine

## 2018-03-24 MED ORDER — HYDROMORPHONE HCL 4 MG PO TABS: 4.0000 mg | ORAL_TABLET | Freq: Four times a day (QID) | ORAL | 0 refills | Status: AC | PRN

## 2018-03-24 NOTE — Telephone Encounter (Signed)
I did send in a 30 day supply but she will need a PMV for any more

## 2018-03-24 NOTE — Telephone Encounter (Signed)
Sent to PCP to advise 

## 2018-03-24 NOTE — Telephone Encounter (Signed)
Requesting refill of Dilaudid, not on current med profile. States had to cancel today's appt as husband is having emergency surgery.  LRF 02/14/18  #120  0 refills  LOV 02/03/18 Dr. Sarajane Jews  [facility] [address]

## 2018-03-24 NOTE — Telephone Encounter (Signed)
Copied from [place] [id number]. Topic: Quick Communication - Rx Refill/Question >> Mar 24, 2018  9:34 AM Synthia Innocent wrote: Medication: HYDROmorphone (DILAUDID) 4 MG tablet, had to cancel appt today, husband is having emergency surgery.  Has the patient contacted their pharmacy? Yes.   (Agent: If no, request that the patient contact the pharmacy for the refill.) (Agent: If yes, when and what did the pharmacy advise?)  Preferred Pharmacy (with phone number [facility] street name[facility] [address]  Agent: Please be advised that RX refills may take up to 3 business days. We ask that you follow-up with your pharmacy.

## 2018-03-25 ENCOUNTER — Encounter (INDEPENDENT_AMBULATORY_CARE_PROVIDER_SITE_OTHER): Payer: Self-pay | Admitting: Specialist

## 2018-03-25 NOTE — Telephone Encounter (Signed)
Called pt and left a detailed VM advised pt to call back to get set up for their pain management office visit for more refills.

## 2018-03-25 NOTE — Patient Instructions (Signed)
Visit was cancelled Rescheduled for 03/10/2018.

## 2018-04-01 ENCOUNTER — Ambulatory Visit (INDEPENDENT_AMBULATORY_CARE_PROVIDER_SITE_OTHER): Payer: 59 | Admitting: Surgery

## 2018-04-01 ENCOUNTER — Encounter (INDEPENDENT_AMBULATORY_CARE_PROVIDER_SITE_OTHER): Payer: Self-pay | Admitting: Surgery

## 2018-04-01 VITALS — BP 160/98 | HR 97 | Ht 63.0 in | Wt 290.0 lb

## 2018-04-01 DIAGNOSIS — M5416 Radiculopathy, lumbar region: Secondary | ICD-10-CM

## 2018-04-01 NOTE — Progress Notes (Signed)
Office Visit Note   [name]           Date of Birth: 1966-02-10           MRN: [id number] Visit Date: 04/01/2018              Requested by: [name], MD [place], [place] [address] PCP: [name], MD   Assessment & Plan: Visit Diagnoses:  1. Radiculopathy, lumbar region     Plan: With patient's ongoing and worsening complaint I will schedule lumbar MRI and have this compared to previous study that was done September 2018.  She will follow-up with Dr. Louanne Skye after completion to discuss results and further treatment options.  Follow-Up Instructions: Return in about 3 weeks (around 04/22/2018) for With Dr. Louanne Skye to review lumbar MRI.   Orders:  Orders Placed This Encounter  Procedures  . MR Lumbar Spine w/o contrast   No orders of the defined types were placed in this encounter.     Procedures: No procedures performed   Clinical Data: No additional findings.   Subjective: Chief Complaint  Patient presents with  . Lower Back - Follow-up    HPI 52 year old female with history of lumbar spondylosis and chronic low back pain returns.  States that her symptoms are getting worse.  Says that she can hardly walk and worsening since last office visit.  She was seen by me March 10, 2018 and I discussed repeating lumbar MRI if symptoms were progressing.  No complaints of bowel or bladder incontinence.  Most of her pain radiates into the right leg with numbness and tingling. Review of Systems No current cardiac pulmonary GI GU issues  Objective: Vital Signs: BP (!) 160/98   Pulse 97   Ht 5\' 3"  (1.6 m)   Wt 290 lb (131.5 kg)   LMP 08/17/2012   BMI 51.37 kg/m   Physical Exam  Constitutional: She is oriented to person, place, and time. No distress.  HENT:  Head: Normocephalic and atraumatic.  Eyes: Pupils are equal, round, and reactive to light. EOM are normal.  Pulmonary/Chest: No respiratory distress.  Musculoskeletal:    Gait antalgic.  Lumbar paraspinal tenderness.  Positive right straight leg raise.  Negative logroll bilateral hips.  Bilateral calves nontender.  Neurological: She is alert and oriented to person, place, and time.  Skin: Skin is warm.    Ortho Exam  Specialty Comments:  No specialty comments available.  Imaging: No results found.   PMFS History: Patient Active Problem List   Diagnosis Date Noted  . 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 Primary osteoarthritis of right knee 02/27/2017  . Lateral epicondylitis of right elbow 11/11/2016  . Muscle cramps 01/15/2016  . Bilateral leg edema 01/08/2016  . Radiculopathy 12/20/2015  . Hyperglycemia 12/21/2014  . Mastodynia, female 11/30/2014  . S/P excision of fibroadenoma of breast 11/30/2014  . Angioedema of lips 04/07/2014  . Fibroadenoma of right breast 03/07/2014  . Pelvic pain in female 06/19/2012  . Menorrhagia 06/11/2012  . SUI (stress urinary incontinence, female) 06/11/2012  . HTN (hypertension) 06/11/2012  . IBS (irritable bowel syndrome) - diarrhea predominant 03/17/2012  . MICROSCOPIC HEMATURIA 11/30/2010  . BREAST PAIN, RIGHT 11/30/2010  . BREAST MASS, RIGHT 01/12/2010  . HYPERCHOLESTEROLEMIA 12/07/2007  . CIGARETTE SMOKER 12/07/2007  . DEGENERATIVE JOINT DISEASE 12/07/2007  . Low back pain with sciatica 12/07/2007  . HEADACHE 12/07/2007  . Obesity 08/03/2007  . ANXIETY 08/03/2007   Past  Medical History:  Diagnosis Date  . Anxiety   . Back pain with radiation   . Breast mass, right   . Complication of anesthesia 04/07/14  . DJD (degenerative joint disease)   . Groin abscess   . Headache(784.0)   . History of IBS   . Hypercholesterolemia   . IBS (irritable bowel syndrome)   . Lactose intolerance   . Mild hypertension   . Obesity   . Tobacco use disorder     Family History  Problem Relation Age of Onset  . Diabetes Father   . Diabetes Mother   . Prostate cancer Paternal Grandfather   . Breast  cancer Maternal Aunt 46    Past Surgical History:  Procedure Laterality Date  . ABDOMINAL HYSTERECTOMY    . ANTERIOR CERVICAL DECOMP/DISCECTOMY FUSION N/A 12/20/2015   Procedure: ANTERIOR CERVICAL DECOMPRESSION FUSION CERVICAL 4-5, CERVICAL 5-6, CERVICAL 6-7 WITH INSTRUMENTATION AND ALLOGRAFT;  Surgeon: Phylliss Bob, MD;  Location: [facility];  Service: Orthopedics;  Laterality: N/A;  Anterior cervical decompression fusion, cervical 4-5, cervical 5-6, cervical 6-7 with instrumentation and allograft  . BACK SURGERY    . BILATERAL SALPINGECTOMY  09/03/2012   Procedure: BILATERAL SALPINGECTOMY;  Surgeon: [name], MD;  Location: [facility];  Service: Gynecology;  Laterality: Bilateral;  . BREAST BIOPSY Right 04/07/2014   Procedure: REMOVAL RIGHT BREAST MASS WITH WIRE LOCALIZATION;  Surgeon: [name], MD;  Location: [facility];  Service: General;  Laterality: Right;  . BREAST LUMPECTOMY     x2  . COLONOSCOPY W/ BIOPSIES  04/24/2012   per Dr. Carlean Purl, clear, repeat in 10 yrs   . disectomy    . ESOPHAGOGASTRODUODENOSCOPY    . FINGER SURGERY     Right index-excision of mass   . FOOT SURGERY Right    Bone Spurs  . LAPAROSCOPIC HYSTERECTOMY  09/03/2012   Procedure: HYSTERECTOMY TOTAL LAPAROSCOPIC;  Surgeon: [name], MD;  Location: [facility];  Service: Gynecology;  Laterality: N/A;  . LUMBAR [place]    . TUBAL LIGATION     Social History   Occupational History  . Occupation: Optometrist  Tobacco Use  . Smoking status: Former Smoker    Years: 33.00    Types: Cigarettes  . Smokeless tobacco: Never Used  . Tobacco comment: quit March 2017  Substance and Sexual Activity  . Alcohol use: No    Alcohol/week: 0.0 oz    Comment: occ  . Drug use: No    Types: Amphetamines  . Sexual activity: Yes    Birth control/protection: Surgical    Comment: tubal ligation

## 2018-04-06 ENCOUNTER — Other Ambulatory Visit: Payer: Self-pay | Admitting: Family Medicine

## 2018-04-06 DIAGNOSIS — Z1231 Encounter for screening mammogram for malignant neoplasm of breast: Secondary | ICD-10-CM

## 2018-04-07 ENCOUNTER — Telehealth: Payer: Self-pay | Admitting: Family Medicine

## 2018-04-07 NOTE — Telephone Encounter (Signed)
Called and spoke with pt. Pt stated that she is fine with waiting until Dr. Sarajane Jews is back in the office to place the referral for the [place]. Pt stated that she has already scheduled for her MM because that was due but they stated that a orders will be needed to do the [place].   Will keep until PCP returns to the office on Thursday.

## 2018-04-07 NOTE — Telephone Encounter (Signed)
Copied from [place] [id number]. Topic: Referral - Request >> Apr 06, 2018  4:39 PM Ivar Drape wrote: Reason for CRM:   Patient stated she needs a referral for a diagnostic mammo because she is having pain in her left breast.  She would like this done as soon as possible.

## 2018-04-09 ENCOUNTER — Telehealth (INDEPENDENT_AMBULATORY_CARE_PROVIDER_SITE_OTHER): Payer: Self-pay | Admitting: Specialist

## 2018-04-09 ENCOUNTER — Ambulatory Visit (INDEPENDENT_AMBULATORY_CARE_PROVIDER_SITE_OTHER): Payer: 59 | Admitting: Surgery

## 2018-04-09 NOTE — Telephone Encounter (Signed)
Called patient left message for a call back

## 2018-04-09 NOTE — Telephone Encounter (Signed)
She needs an OV for a breast exam. I cannot order this sight unseen

## 2018-04-10 ENCOUNTER — Telehealth (INDEPENDENT_AMBULATORY_CARE_PROVIDER_SITE_OTHER): Payer: Self-pay | Admitting: Specialist

## 2018-04-10 NOTE — Telephone Encounter (Signed)
I called her and schedule her for appt to review MRI on 05/07/18

## 2018-04-10 NOTE — Telephone Encounter (Signed)
Called pt and left a detailed VM to call our office back to schedule her office visit.

## 2018-04-10 NOTE — Telephone Encounter (Signed)
Called patient left message to return call concerning schedule appointment after MRI scheduled 04/16/18

## 2018-04-10 NOTE — Telephone Encounter (Signed)
MRI Review concern  Please call pt to discuss pt care

## 2018-04-16 ENCOUNTER — Ambulatory Visit
Admission: RE | Admit: 2018-04-16 | Discharge: 2018-04-16 | Disposition: A | Discharge status: Home / Self Care | Payer: 59 | Source: Ambulatory Visit | Attending: Surgery | Admitting: Surgery

## 2018-04-16 DIAGNOSIS — M48061 Spinal stenosis, lumbar region without neurogenic claudication: Secondary | ICD-10-CM | POA: Diagnosis not present

## 2018-04-16 DIAGNOSIS — M5416 Radiculopathy, lumbar region: Secondary | ICD-10-CM

## 2018-04-20 ENCOUNTER — Encounter: Payer: Self-pay | Admitting: Family Medicine

## 2018-04-20 ENCOUNTER — Ambulatory Visit (INDEPENDENT_AMBULATORY_CARE_PROVIDER_SITE_OTHER): Payer: 59 | Admitting: Family Medicine

## 2018-04-20 VITALS — BP 140/94 | HR 129 | Temp 98.0°F | Ht 63.0 in | Wt 288.4 lb

## 2018-04-20 DIAGNOSIS — M791 Myalgia, unspecified site: Secondary | ICD-10-CM | POA: Diagnosis not present

## 2018-04-20 DIAGNOSIS — M940 Chondrocostal junction syndrome [Tietze]: Secondary | ICD-10-CM

## 2018-04-20 NOTE — Progress Notes (Signed)
   Subjective:    Patient ID: Dana [name], female    DOB: [birth year], 52 y.o.   MRN: [id number]  HPI Here for 4 weeks of intermittent sharp pains in the center of the chest and behind the left breast.  No breast lumps are felt. No nipple DC. No SOB. She is in the process of scheduling for a 3D mammogram. She is to have surgery to repair an umbilical hernia on 9-76-73.    Review of Systems  Constitutional: Negative.   Respiratory: Negative.   Cardiovascular: Positive for chest pain. Negative for palpitations and leg swelling.  Gastrointestinal: Negative.        Objective:   Physical Exam  Constitutional: She appears well-developed and well-nourished.  Neck: No thyromegaly present.  Cardiovascular: Normal rate, regular rhythm, normal heart sounds and intact distal pulses.  Pulmonary/Chest: Effort normal and breath sounds normal. No stridor. No respiratory distress. She has no wheezes. She has no rales.  She is very tender along both sternal margins, left worse than right.   Genitourinary:  Genitourinary Comments: Both breasts reveal no lumps and no tenderness   Lymphadenopathy:    She has no cervical adenopathy.          Assessment & Plan:  Costochondritis, we will get back on Gabapentin 300mg  but will increase it to TID. I explained this does not involve the breast so no special imaging other than her standard mammogram is needed.  [name], MD

## 2018-04-21 ENCOUNTER — Telehealth: Payer: Self-pay | Admitting: Family Medicine

## 2018-04-21 NOTE — Telephone Encounter (Signed)
Left pt. A message that she needs to schedule a pain management visit for refills of pain medication.

## 2018-04-21 NOTE — Telephone Encounter (Signed)
Copied from [place] [id number]. Topic: Quick Communication - See Telephone Encounter >> Apr 21, 2018 11:02 AM Marja Kays F wrote: Pt is needing a refill on hydromorphone   [facility]  [address]  Bet number [phone]

## 2018-04-22 NOTE — Pre-Procedure Instructions (Signed)
Dana [name]  04/22/2018      [id number] [address], [place] - [place] [address] Phone: [phone] Fax: [phone]    Your procedure is scheduled on Fri., Aug. 16, 2019 from 9:00AM-10:00AM  Report to [facility] Admitting Entrance "A" at 7:00AM  Call this number if you have problems the morning of surgery:  [phone]   Remember:  Do not eat or drink after midnight on Aug. 15th    Take these medicines the morning of surgery with A SIP OF WATER: HydrALAZINE (APRESOLINE), Dorzolamide-timolol (COSOPT), and Ketorolac (ACULAR)   If needed HYDROmorphone (DILAUDID) and Ondansetron (ZOFRAN)   7 days before surgery (8/9), stop taking all Other Aspirin Products, Vitamins, Fish oils, and Herbal medications. Also stop all NSAIDS i.e. Advil, Ibuprofen, Motrin, Aleve, Anaprox, Naproxen, BC, Goody Powders, and all Supplements.   Do not wear jewelry, make-up or nail polish.  Do not wear lotions, powders, or perfumes, or deodorant.  Do not shave 48 hours prior to surgery.    Do not bring valuables to the hospital.  [facility] is not responsible for any belongings or valuables.  Contacts, dentures or bridgework may not be worn into surgery.  Leave your suitcase in the car.  After surgery it may be brought to your room.  For patients admitted to the hospital, discharge time will be determined by your treatment team.  Patients discharged the day of surgery will not be allowed to drive home.   Special instructions:   [facility]- Preparing For Surgery  Before surgery, you can play an important role. Because skin is not sterile, your skin needs to be as free of germs as possible. You can reduce the number of germs on your skin by washing with CHG (chlorahexidine gluconate) Soap before surgery.  CHG is an antiseptic cleaner which kills germs and bonds with the skin to continue killing germs even after washing.    Oral  Hygiene is also important to reduce your risk of infection.  Remember - BRUSH YOUR TEETH THE MORNING OF SURGERY WITH YOUR REGULAR TOOTHPASTE  Please do not use if you have an allergy to CHG or antibacterial soaps. If your skin becomes reddened/irritated stop using the CHG.  Do not shave (including legs and underarms) for at least 48 hours prior to first CHG shower. It is OK to shave your face.  Please follow these instructions carefully.   1. Shower the NIGHT BEFORE SURGERY and the MORNING OF SURGERY with CHG.   2. If you chose to wash your hair, wash your hair first as usual with your normal shampoo.  3. After you shampoo, rinse your hair and body thoroughly to remove the shampoo.  4. Use CHG as you would any other liquid soap. You can apply CHG directly to the skin and wash gently with a scrungie or a clean washcloth.   5. Apply the CHG Soap to your body ONLY FROM THE NECK DOWN.  Do not use on open wounds or open sores. Avoid contact with your eyes, ears, mouth and genitals (private parts). Wash Face and genitals (private parts)  with your normal soap.  6. Wash thoroughly, paying special attention to the area where your surgery will be performed.  7. Thoroughly rinse your body with warm water from the neck down.  8. DO NOT shower/wash with your normal soap after using and rinsing off the CHG Soap.  9. Pat yourself dry with  a CLEAN TOWEL.  10. Wear CLEAN PAJAMAS to bed the night before surgery, wear comfortable clothes the morning of surgery  11. Place CLEAN SHEETS on your bed the night of your first shower and DO NOT SLEEP WITH PETS.  Day of Surgery:  Do not apply any deodorants/lotions.  Please wear clean clothes to the hospital/surgery center.   Remember to brush your teeth WITH YOUR REGULAR TOOTHPASTE.  Please read over the following fact sheets that you were given. Pain Booklet, Coughing and Deep Breathing and Surgical Site Infection Prevention

## 2018-04-23 ENCOUNTER — Other Ambulatory Visit: Payer: Self-pay

## 2018-04-23 ENCOUNTER — Encounter (HOSPITAL_COMMUNITY): Payer: Self-pay

## 2018-04-23 ENCOUNTER — Ambulatory Visit (HOSPITAL_COMMUNITY)
Admission: RE | Admit: 2018-04-23 | Discharge: 2018-04-23 | Disposition: A | Discharge status: Home / Self Care | Payer: 59 | Source: Ambulatory Visit | Attending: General Surgery | Admitting: General Surgery

## 2018-04-23 ENCOUNTER — Encounter (HOSPITAL_COMMUNITY)
Admission: RE | Admit: 2018-04-23 | Discharge: 2018-04-23 | Disposition: A | Discharge status: Home / Self Care | Payer: 59 | Source: Ambulatory Visit | Attending: General Surgery | Admitting: General Surgery

## 2018-04-23 DIAGNOSIS — Z01818 Encounter for other preprocedural examination: Secondary | ICD-10-CM

## 2018-04-23 HISTORY — DX: Fibromyalgia: M79.7

## 2018-04-23 HISTORY — DX: Umbilical hernia without obstruction or gangrene: K42.9

## 2018-04-23 LAB — BASIC METABOLIC PANEL
Anion gap: 12 (ref 5–15)
BUN: 7 mg/dL (ref 6–20)
CALCIUM: 9.2 mg/dL (ref 8.9–10.3)
CO2: 25 mmol/L (ref 22–32)
CREATININE: 1 mg/dL (ref 0.44–1.00)
Chloride: 103 mmol/L (ref 98–111)
GFR calc non Af Amer: >60 mL/min (ref >60)
Glucose, Bld: 108 mg/dL — ABNORMAL HIGH (ref 70–99)
Potassium: 3.7 mmol/L (ref 3.5–5.1)
SODIUM: 140 mmol/L (ref 135–145)

## 2018-04-23 LAB — CBC WITH DIFFERENTIAL/PLATELET
Abs Immature Granulocytes: 0 10*3/uL (ref 0.0–0.1)
BASOS PCT: 1 %
Basophils Absolute: 0.1 10*3/uL (ref 0.0–0.1)
EOS ABS: 0.1 10*3/uL (ref 0.0–0.7)
EOS PCT: 1 %
HEMATOCRIT: 46 % (ref 36.0–46.0)
Hemoglobin: 14 g/dL (ref 12.0–15.0)
IMMATURE GRANULOCYTES: 0 %
Lymphocytes Relative: 20 %
Lymphs Abs: 2.4 10*3/uL (ref 0.7–4.0)
MCH: 29.4 pg (ref 26.0–34.0)
MCHC: 30.4 g/dL (ref 30.0–36.0)
MCV: 96.4 fL (ref 78.0–100.0)
MONOS PCT: 7 %
Monocytes Absolute: 0.8 10*3/uL (ref 0.1–1.0)
NEUTROS PCT: 71 %
Neutro Abs: 8.3 10*3/uL — ABNORMAL HIGH (ref 1.7–7.7)
Platelets: 301 10*3/uL (ref 150–400)
RBC: 4.77 MIL/uL (ref 3.87–5.11)
RDW: 14.5 % (ref 11.5–15.5)
WBC: 11.6 10*3/uL — ABNORMAL HIGH (ref 4.0–10.5)

## 2018-04-23 NOTE — Progress Notes (Signed)
PCP - Dr. [name]  Cardiologist - Denies  Chest x-ray - 04/23/18  EKG - 04/23/18  Stress Test - Denies  ECHO - Denies  Cardiac Cath - Denies  Sleep Study - Denies CPAP - None  LABS- 04/23/18: CBC w/D, BMP  ASA- Denies  Anesthesia- No  Pt denies having chest pain, sob, or fever at this time. All instructions explained to the pt, with a verbal understanding of the material. Pt agrees to go over the instructions while at home for a better understanding. The opportunity to ask questions was provided.

## 2018-04-27 ENCOUNTER — Ambulatory Visit (INDEPENDENT_AMBULATORY_CARE_PROVIDER_SITE_OTHER): Payer: 59 | Admitting: Family Medicine

## 2018-04-27 ENCOUNTER — Telehealth: Payer: Self-pay

## 2018-04-27 ENCOUNTER — Encounter: Payer: Self-pay | Admitting: Family Medicine

## 2018-04-27 VITALS — BP 160/100 | HR 104 | Temp 98.5°F | Wt 288.8 lb

## 2018-04-27 DIAGNOSIS — M5440 Lumbago with sciatica, unspecified side: Secondary | ICD-10-CM

## 2018-04-27 DIAGNOSIS — F1190 Opioid use, unspecified, uncomplicated: Secondary | ICD-10-CM

## 2018-04-27 DIAGNOSIS — G8929 Other chronic pain: Secondary | ICD-10-CM | POA: Diagnosis not present

## 2018-04-27 MED ORDER — HYDROMORPHONE HCL 4 MG PO TABS: 4.0000 mg | ORAL_TABLET | Freq: Four times a day (QID) | ORAL | 0 refills | Status: DC | PRN

## 2018-04-27 MED ORDER — CYCLOBENZAPRINE HCL 10 MG PO TABS: ORAL_TABLET | ORAL | 3 refills | Status: DC

## 2018-04-27 NOTE — Telephone Encounter (Signed)
Pt is very upset that her current rx of Dilaudid only has #30. She would like to know why this has changed. She would like a call back.

## 2018-04-27 NOTE — Progress Notes (Signed)
   Subjective:    Patient ID: Dana [name], female    DOB: 01/21/1966, 52 y.o.   MRN: [id number]  HPI Here for pain management. She has been doing well. We saw her recently for chest wall pain and we increased the dose of her Gabapentin.  Indication for chronic opioid: low back pain  Medication and dose: Dilaudid 4 mg  # pills per month: 120 Last UDS date: 12-15-17 Opioid Treatment Agreement signed (Y/N): 12-15-17 Opioid Treatment Agreement last reviewed with patient:  04-27-18 [facility] reviewed this encounter (include red flags):  04-27-18    Review of Systems  Constitutional: Negative.   Respiratory: Negative.   Cardiovascular: Negative.   Musculoskeletal: Positive for back pain.  Neurological: Negative.        Objective:   Physical Exam  Constitutional: She appears well-developed and well-nourished.  Cardiovascular: Normal rate, regular rhythm, normal heart sounds and intact distal pulses.  Pulmonary/Chest: Effort normal and breath sounds normal.          Assessment & Plan:  Pain management. The Dilaudid was refilled. She will increase the Gabapentin to 600 mg tid.  [name], MD

## 2018-04-28 NOTE — Telephone Encounter (Signed)
Pt called in again asking why she was only given #30 doses of Dilaudid. She states she normally gets #120. She is requesting a call back to discuss.

## 2018-04-28 NOTE — Telephone Encounter (Signed)
Called pt's pharmacy. The last prescription was sent in for a disp. amount of 30 was this an error Dr. Sarajane Jews or did you mean to send in for 120?

## 2018-04-29 MED ORDER — HYDROMORPHONE HCL 4 MG PO TABS: 4.0000 mg | ORAL_TABLET | Freq: Four times a day (QID) | ORAL | 0 refills | Status: DC | PRN

## 2018-04-29 MED ORDER — HYDROMORPHONE HCL 4 MG PO TABS: 4.0000 mg | ORAL_TABLET | ORAL | 0 refills | Status: DC | PRN

## 2018-04-29 NOTE — Addendum Note (Signed)
Addended by: Dorrene German on: 04/29/2018 01:09 PM   Modules accepted: Orders

## 2018-04-29 NOTE — Telephone Encounter (Signed)
These were sent in. Please cancel the ones we sent that were for #30

## 2018-04-29 NOTE — Telephone Encounter (Signed)
One rx printed with incorrect sig. All 3 rxs shredded and reprinted and filed at front desk.

## 2018-04-29 NOTE — Telephone Encounter (Signed)
These were sent in  

## 2018-04-29 NOTE — Telephone Encounter (Signed)
Dr. Sarajane Jews, confirmed with the pharmacy the rx for 120 was not received.  Please resend.

## 2018-04-29 NOTE — Telephone Encounter (Signed)
Dr. Sarajane Jews, I have cancelled the 30 day prescriptions.  90 day prescriptions failed to get to the pharmacy. Please resend electronically.

## 2018-04-29 NOTE — Telephone Encounter (Signed)
Hard copy of rxs printed per Dr. Sarajane Jews and filed at front desk for pick up. Patient notified and verbalized understanding.

## 2018-04-30 MED ORDER — BUPIVACAINE LIPOSOME 1.3 % IJ SUSP
20.0000 mL | INTRAMUSCULAR | Status: AC
  Administered 2018-05-01: 20 mL
  Filled 2018-04-30: qty 20

## 2018-04-30 NOTE — H&P (Signed)
[name] Documented: 02/17/2018 12:00 PM Location: [facility] Patient #: [id number] DOB: 12-10-1965 Married / Language: English / Race: Black or African American Female   History of Present Illness Dana [name]. Yarieliz Wasser MD; 02/17/2018 12:14 PM) Patient words: Felt apin and discomfort in the periumbilical area starting two weeks before Mother's Day. Told that she possibly had a hernia by her PCP. Is taking pain medications for this now.  The patient is a 52 year old female.   Past Surgical History (Tanisha A. Owens Shark, [place]; 02/17/2018 12:00 PM) Breast Biopsy  Right. Foot Surgery  Right. Hysterectomy (not due to cancer) - Partial  Shoulder Surgery  Right. Spinal Surgery - Lower Back  Spinal Surgery - Neck  Spinal Surgery Midback  Diagnostic Studies History (Tanisha A. Owens Shark, [place] ; 02/17/2018 12:00 PM) Colonoscopy 5-10 years ago Mammogram  1-3 years ago  Allergies (Tanisha A. Owens Shark, [place]; 02/17/2018 12:02 PM) Penicillins  Lisinopril *ANTIHYPERTENSIVES*  Codeine Phosphate *ANALGESICS - OPIOID* Allergies Reconciled  Medication History (Tanisha A. Owens Shark, [place]; 02/17/2018 12:06 PM) Atorvastatin Calcium (20MG  Tablet, Oral) Active. Tessalon (200MG  Capsule, Oral) Active. Cholecalciferol (50000UNIT Tablet, Oral) Active. Flexeril (10MG  Tablet, Oral) Active. Lasix (40MG  Tablet, Oral) Active. Gabapentin (300MG  Tablet, Oral) Active. HydrALAZINE HCl (10MG  Tablet, Oral) Active. HYDROmorphone HCl (4MG  Tablet, Oral) Active. Metoprolol Succinate ER (100MG  Tablet ER 24HR, Oral) Active. Relafen (500MG  Tablet, Oral) Active. Zofran (8MG  Tablet, Oral) Active. Potassium (99MG  Tablet, Oral) Active. TraMADol HCl (50MG  Tablet, Oral) Active. Kenalog (0.1% Lotion, External) Active. Medications Reconciled  Social History (Tanisha A. Owens Shark, [place]; 02/17/2018 12:00 PM) Alcohol use  Occasional alcohol use. Tobacco use Former smoker.  Family History (Tanisha A. Owens Shark, [place];  02/17/2018 12:00 PM) Diabetes Mellitus  Father. Hypertension  Mother. Kidney Disease  Son.  Pregnancy / Birth History (Tanisha A. Owens Shark, RMA; 02/17/2018 12:00 PM) Age at menarche  74 years. Age of menopause  24-50 Gravida  4 Irregular periods  Maternal age  57-20 Para  4  Other Problems (Tanisha A. Owens Shark, RMA; 02/17/2018 12:00 PM) Back Pain  Gastroesophageal Reflux Disease  Umbilical Hernia Repair     Review of Systems (Tanisha A. Brown RMA; 02/17/2018 12:00 PM) General Not Present- Appetite Loss, Chills, Fatigue, Fever, Night Sweats, Weight Gain and Weight Loss. HEENT Not Present- Earache, Hearing Loss, Hoarseness, Nose Bleed, Oral Ulcers, Ringing in the Ears, Seasonal Allergies, Sinus Pain, Sore Throat, Visual Disturbances, Wears glasses/contact lenses and Yellow Eyes. Respiratory Not Present- Bloody sputum, Chronic Cough, Difficulty Breathing, Snoring and Wheezing. Breast Not Present- Breast Mass, Breast Pain, Nipple Discharge and Skin Changes. Gastrointestinal Present- Nausea. Not Present- Abdominal Pain, Bloating, Bloody Stool, Change in Bowel Habits, Chronic diarrhea, Constipation, Difficulty Swallowing, Excessive gas, Gets full quickly at meals, Hemorrhoids, Indigestion, Rectal Pain and Vomiting. Musculoskeletal Not Present- Back Pain, Joint Pain, Joint Stiffness, Muscle Pain, Muscle Weakness and Swelling of Extremities. Psychiatric Not Present- Anxiety, Bipolar, Change in Sleep Pattern, Depression, Fearful and Frequent crying. Endocrine Not Present- Cold Intolerance, Excessive Hunger, Hair Changes, Heat Intolerance, Hot flashes and New Diabetes. Hematology Not Present- Blood Thinners, Easy Bruising, Excessive bleeding, Gland problems, HIV and Persistent Infections.  Vitals (Tanisha A. Brown RMA; 02/17/2018 12:01 PM) 02/17/2018 12:01 PM Weight: 291.8 lb Height: 63in Body Surface Area: 2.27 m Body Mass Index: 51.69 kg/m Temp.: 98.48F Pulse: 103 (Regular)  BP: 148/88  (Sitting, Left Arm, Standard) BP today 156/76, P 93   Physical Exam (Kimber Fritts O. Hulen Skains MD; 02/17/2018 12:15 PM) Abdomen Note: No actual discreet hernia palpated. Nothing reducible. Very tender  to the patient. Much more pain than sould be expected. Seem more like a diastasis recti than a ventral hernia. Able to detect supraumbilical fullness.  Confirmed by CT abdomen Lungs:  Clear to auscultation.  Assessment & Plan Jeneen Rinks O. Kendle Turbin MD; 02/17/2018 12:17 PM) VENTRAL HERNIA (K43.9) Impression: Either small ventral hernia associated with diastasis recti, or just a diastasis. Will get CT scan of the abdomen and pelvis to delineate. Will have the patient RTC two weeks. Current Plans: Open Repair with or without mesh  Dana [name]. Dahlia Bailiff, MD, [place] [phone] [facility]

## 2018-05-01 ENCOUNTER — Ambulatory Visit (HOSPITAL_COMMUNITY): Payer: 59

## 2018-05-01 ENCOUNTER — Encounter (HOSPITAL_COMMUNITY): Payer: Self-pay | Admitting: Surgery

## 2018-05-01 ENCOUNTER — Other Ambulatory Visit: Payer: Self-pay

## 2018-05-01 ENCOUNTER — Ambulatory Visit (HOSPITAL_COMMUNITY)
Admission: RE | Admit: 2018-05-01 | Discharge: 2018-05-01 | Disposition: A | Discharge status: Home / Self Care | Payer: 59 | Source: Ambulatory Visit | Attending: General Surgery | Admitting: General Surgery

## 2018-05-01 ENCOUNTER — Encounter (HOSPITAL_COMMUNITY)
Admission: RE | Disposition: A | Discharge status: Home / Self Care | Payer: Self-pay | Source: Ambulatory Visit | Attending: General Surgery

## 2018-05-01 DIAGNOSIS — M797 Fibromyalgia: Secondary | ICD-10-CM | POA: Insufficient documentation

## 2018-05-01 DIAGNOSIS — I10 Essential (primary) hypertension: Secondary | ICD-10-CM | POA: Insufficient documentation

## 2018-05-01 DIAGNOSIS — Z79899 Other long term (current) drug therapy: Secondary | ICD-10-CM | POA: Insufficient documentation

## 2018-05-01 DIAGNOSIS — K429 Umbilical hernia without obstruction or gangrene: Secondary | ICD-10-CM | POA: Diagnosis not present

## 2018-05-01 DIAGNOSIS — E7800 Pure hypercholesterolemia, unspecified: Secondary | ICD-10-CM | POA: Diagnosis not present

## 2018-05-01 DIAGNOSIS — Z6841 Body Mass Index (BMI) 40.0 and over, adult: Secondary | ICD-10-CM | POA: Diagnosis not present

## 2018-05-01 DIAGNOSIS — Z87891 Personal history of nicotine dependence: Secondary | ICD-10-CM | POA: Insufficient documentation

## 2018-05-01 HISTORY — PX: UMBILICAL HERNIA REPAIR: SHX196

## 2018-05-01 SURGERY — REPAIR, HERNIA, UMBILICAL, ADULT
Anesthesia: General | Site: Abdomen

## 2018-05-01 MED ORDER — ROCURONIUM BROMIDE 100 MG/10ML IV SOLN
INTRAVENOUS | Status: DC | PRN
Start: 2018-05-01 — End: 2018-05-01
  Administered 2018-05-01: 10 mg via INTRAVENOUS
  Administered 2018-05-01: 50 mg via INTRAVENOUS

## 2018-05-01 MED ORDER — PROPOFOL 10 MG/ML IV BOLUS
INTRAVENOUS | Status: DC | PRN
  Administered 2018-05-01: 40 mg via INTRAVENOUS
  Administered 2018-05-01: 200 mg via INTRAVENOUS
  Administered 2018-05-01: 50 mg via INTRAVENOUS

## 2018-05-01 MED ORDER — HYDROMORPHONE HCL 2 MG PO TABS
2.0000 mg | ORAL_TABLET | Freq: Once | ORAL | Status: AC
  Administered 2018-05-01: 4 mg via ORAL

## 2018-05-01 MED ORDER — LACTATED RINGERS IV SOLN
INTRAVENOUS | Status: DC
  Administered 2018-05-01 (×2): via INTRAVENOUS

## 2018-05-01 MED ORDER — CHLORHEXIDINE GLUCONATE CLOTH 2 % EX PADS: 6.0000 | MEDICATED_PAD | Freq: Once | CUTANEOUS | Status: DC

## 2018-05-01 MED ORDER — LIDOCAINE HCL (CARDIAC) PF 100 MG/5ML IV SOSY
PREFILLED_SYRINGE | INTRAVENOUS | Status: DC | PRN
  Administered 2018-05-01: 80 mg via INTRAVENOUS

## 2018-05-01 MED ORDER — HYDROMORPHONE HCL 2 MG PO TABS: 4.0000 mg | ORAL_TABLET | Freq: Once | ORAL | Status: DC

## 2018-05-01 MED ORDER — ACETAMINOPHEN 500 MG PO TABS
1000.0000 mg | ORAL_TABLET | ORAL | Status: AC
  Administered 2018-05-01: 1000 mg via ORAL
  Filled 2018-05-01: qty 2

## 2018-05-01 MED ORDER — 0.9 % SODIUM CHLORIDE (POUR BTL) OPTIME
TOPICAL | Status: DC | PRN
  Administered 2018-05-01: 1000 mL

## 2018-05-01 MED ORDER — FENTANYL CITRATE (PF) 100 MCG/2ML IJ SOLN
INTRAMUSCULAR | Status: AC
  Filled 2018-05-01: qty 2

## 2018-05-01 MED ORDER — CELECOXIB 200 MG PO CAPS
200.0000 mg | ORAL_CAPSULE | ORAL | Status: AC
  Administered 2018-05-01: 200 mg via ORAL
  Filled 2018-05-01: qty 1

## 2018-05-01 MED ORDER — ONDANSETRON HCL 4 MG/2ML IJ SOLN
INTRAMUSCULAR | Status: DC | PRN
  Administered 2018-05-01: 4 mg via INTRAVENOUS

## 2018-05-01 MED ORDER — ONDANSETRON HCL 4 MG/2ML IJ SOLN: 4.0000 mg | Freq: Once | INTRAMUSCULAR | Status: DC | PRN

## 2018-05-01 MED ORDER — DEXAMETHASONE SODIUM PHOSPHATE 4 MG/ML IJ SOLN
INTRAMUSCULAR | Status: DC | PRN
  Administered 2018-05-01: 8 mg via INTRAVENOUS

## 2018-05-01 MED ORDER — PROPOFOL 10 MG/ML IV BOLUS
INTRAVENOUS | Status: AC
  Filled 2018-05-01: qty 20

## 2018-05-01 MED ORDER — MIDAZOLAM HCL 2 MG/2ML IJ SOLN
INTRAMUSCULAR | Status: DC | PRN
  Administered 2018-05-01: 2 mg via INTRAVENOUS

## 2018-05-01 MED ORDER — FENTANYL CITRATE (PF) 250 MCG/5ML IJ SOLN
INTRAMUSCULAR | Status: AC
  Filled 2018-05-01: qty 5

## 2018-05-01 MED ORDER — POVIDONE-IODINE 10 % EX OINT
TOPICAL_OINTMENT | CUTANEOUS | Status: AC
  Filled 2018-05-01: qty 28.35

## 2018-05-01 MED ORDER — SODIUM CHLORIDE 0.9 % IV SOLN
INTRAVENOUS | Status: AC
  Filled 2018-05-01: qty 500000

## 2018-05-01 MED ORDER — HYDROMORPHONE HCL 4 MG PO TABS: 4.0000 mg | ORAL_TABLET | Freq: Four times a day (QID) | ORAL | 0 refills | Status: DC | PRN

## 2018-05-01 MED ORDER — DEXMEDETOMIDINE HCL 200 MCG/2ML IV SOLN
INTRAVENOUS | Status: DC | PRN
  Administered 2018-05-01: 8 ug via INTRAVENOUS
  Administered 2018-05-01 (×3): 4 ug via INTRAVENOUS
  Administered 2018-05-01: 8 ug via INTRAVENOUS

## 2018-05-01 MED ORDER — MIDAZOLAM HCL 2 MG/2ML IJ SOLN
INTRAMUSCULAR | Status: AC
  Filled 2018-05-01: qty 2

## 2018-05-01 MED ORDER — SUGAMMADEX SODIUM 200 MG/2ML IV SOLN
INTRAVENOUS | Status: DC | PRN
  Administered 2018-05-01: 500 mg via INTRAVENOUS

## 2018-05-01 MED ORDER — FENTANYL CITRATE (PF) 100 MCG/2ML IJ SOLN
25.0000 ug | INTRAMUSCULAR | Status: DC | PRN
  Administered 2018-05-01 (×3): 50 ug via INTRAVENOUS

## 2018-05-01 MED ORDER — CIPROFLOXACIN IN D5W 400 MG/200ML IV SOLN
400.0000 mg | INTRAVENOUS | Status: AC
  Administered 2018-05-01: 400 mg via INTRAVENOUS
  Filled 2018-05-01: qty 200

## 2018-05-01 MED ORDER — HYDROMORPHONE HCL 2 MG PO TABS
ORAL_TABLET | ORAL | Status: AC
  Administered 2018-05-01: 4 mg via ORAL
  Filled 2018-05-01: qty 2

## 2018-05-01 MED ORDER — GABAPENTIN 300 MG PO CAPS
300.0000 mg | ORAL_CAPSULE | ORAL | Status: AC
  Administered 2018-05-01: 300 mg via ORAL
  Filled 2018-05-01: qty 1

## 2018-05-01 MED ORDER — FENTANYL CITRATE (PF) 250 MCG/5ML IJ SOLN
INTRAMUSCULAR | Status: DC | PRN
  Administered 2018-05-01 (×4): 50 ug via INTRAVENOUS
  Administered 2018-05-01: 100 ug via INTRAVENOUS

## 2018-05-01 SURGICAL SUPPLY — 43 items
ADH SKN CLS APL DERMABOND .7 (GAUZE/BANDAGES/DRESSINGS) ×2
BINDER ABD UNIV 10 28-50 (GAUZE/BANDAGES/DRESSINGS) ×1 IMPLANT
BINDER ABDOM UNIV 10 (GAUZE/BANDAGES/DRESSINGS) ×4
CANISTER SUCT 3000ML PPV (MISCELLANEOUS) ×3 IMPLANT
CHLORAPREP W/TINT 26ML (MISCELLANEOUS) ×4 IMPLANT
CLOSURE WOUND 1/2 X4 (GAUZE/BANDAGES/DRESSINGS) ×1
COVER SURGICAL LIGHT HANDLE (MISCELLANEOUS) ×4 IMPLANT
DERMABOND ADVANCED (GAUZE/BANDAGES/DRESSINGS) ×2
DERMABOND ADVANCED .7 DNX12 (GAUZE/BANDAGES/DRESSINGS) ×2 IMPLANT
DRAPE LAPAROTOMY TRNSV 102X78 (DRAPE) ×4 IMPLANT
DRAPE UTILITY XL STRL (DRAPES) ×5 IMPLANT
ELECT REM PT RETURN 9FT ADLT (ELECTROSURGICAL) ×4
ELECTRODE REM PT RTRN 9FT ADLT (ELECTROSURGICAL) ×2 IMPLANT
GAUZE SPONGE 4X4 12PLY STRL LF (GAUZE/BANDAGES/DRESSINGS) ×3 IMPLANT
GLOVE BIOGEL PI IND STRL 8 (GLOVE) ×2 IMPLANT
GLOVE BIOGEL PI INDICATOR 8 (GLOVE) ×2
GLOVE ECLIPSE 7.5 STRL STRAW (GLOVE) ×4 IMPLANT
GOWN STRL REUS W/ TWL LRG LVL3 (GOWN DISPOSABLE) ×4 IMPLANT
GOWN STRL REUS W/TWL LRG LVL3 (GOWN DISPOSABLE) ×8
KIT BASIN OR (CUSTOM PROCEDURE TRAY) ×4 IMPLANT
KIT TURNOVER KIT B (KITS) ×4 IMPLANT
NDL HYPO 25GX1X1/2 BEV (NEEDLE) ×1 IMPLANT
NEEDLE HYPO 25GX1X1/2 BEV (NEEDLE) ×4 IMPLANT
NS IRRIG 1000ML POUR BTL (IV SOLUTION) ×4 IMPLANT
PACK SURGICAL SETUP 50X90 (CUSTOM PROCEDURE TRAY) ×4 IMPLANT
PAD ARMBOARD 7.5X6 YLW CONV (MISCELLANEOUS) ×4 IMPLANT
PENCIL BUTTON HOLSTER BLD 10FT (ELECTRODE) ×4 IMPLANT
SPONGE INTESTINAL PEANUT (DISPOSABLE) ×1 IMPLANT
SPONGE LAP 18X18 X RAY DECT (DISPOSABLE) ×7 IMPLANT
STRIP CLOSURE SKIN 1/2X4 (GAUZE/BANDAGES/DRESSINGS) ×3 IMPLANT
SUT MNCRL AB 4-0 PS2 18 (SUTURE) ×4 IMPLANT
SUT NOVA 1 T20/GS 25DT (SUTURE) ×6 IMPLANT
SUT VIC AB 3-0 SH 27 (SUTURE) ×4
SUT VIC AB 3-0 SH 27X BRD (SUTURE) ×2 IMPLANT
SUT VIC AB 3-0 SH 8-18 (SUTURE) ×3 IMPLANT
SUT VICRYL AB 3 0 TIES (SUTURE) ×4 IMPLANT
SYR BULB 3OZ (MISCELLANEOUS) ×4 IMPLANT
SYR CONTROL 10ML LL (SYRINGE) ×4 IMPLANT
TOWEL OR 17X24 6PK STRL BLUE (TOWEL DISPOSABLE) ×4 IMPLANT
TOWEL OR 17X26 10 PK STRL BLUE (TOWEL DISPOSABLE) ×4 IMPLANT
TUBE CONNECTING 12'X1/4 (SUCTIONS) ×1
TUBE CONNECTING 12X1/4 (SUCTIONS) ×2 IMPLANT
YANKAUER SUCT BULB TIP NO VENT (SUCTIONS) ×3 IMPLANT

## 2018-05-01 NOTE — Anesthesia Postprocedure Evaluation (Signed)
Anesthesia Post Note  [name]  Procedure(s) Performed: UMBILICAL HERNIA REPAIR (N/A Abdomen)     Patient location during evaluation: PACU Anesthesia Type: General Level of consciousness: awake and alert Pain management: satisfactory to patient Vital Signs Assessment: post-procedure vital signs reviewed and stable Respiratory status: spontaneous breathing, nonlabored ventilation, respiratory function stable and patient connected to nasal cannula oxygen Cardiovascular status: blood pressure returned to baseline and stable Postop Assessment: no apparent nausea or vomiting Anesthetic complications: no    Last Vitals:  Vitals:   05/01/18 1226 05/01/18 1300  BP: (!) 158/95 118/73  Pulse: 85 87  Resp: (!) 21 13  Temp:    SpO2: 100% 97%                  Audry Pili

## 2018-05-01 NOTE — Anesthesia Preprocedure Evaluation (Signed)
Anesthesia Evaluation  Patient identified by MRN, date of birth, ID band Patient awake    Reviewed: Allergy & Precautions, NPO status , Patient's Chart, lab work & pertinent test results  Airway Mallampati: II  TM Distance: >3 FB Neck ROM: Full    Dental  (+) Dental Advisory Given   Pulmonary former smoker,    breath sounds clear to auscultation       Cardiovascular hypertension, Pt. on medications  Rhythm:Regular Rate:Normal     Neuro/Psych  Neuromuscular disease    GI/Hepatic negative GI ROS, Neg liver ROS,   Endo/Other  Morbid obesity  Renal/GU negative Renal ROS     Musculoskeletal  (+) Arthritis , Fibromyalgia -  Abdominal   Peds  Hematology negative hematology ROS (+)   Anesthesia Other Findings   Reproductive/Obstetrics                             Lab Results  Component Value Date   WBC 11.6 (H) 04/23/2018   HGB 14.0 04/23/2018   HCT 46.0 04/23/2018   MCV 96.4 04/23/2018   PLT 301 04/23/2018   Lab Results  Component Value Date   CREATININE 1.00 04/23/2018   BUN 7 04/23/2018   NA 140 04/23/2018   K 3.7 04/23/2018   CL 103 04/23/2018   CO2 25 04/23/2018    Anesthesia Physical Anesthesia Plan  ASA: III  Anesthesia Plan: General   Post-op Pain Management:    Induction: Intravenous  PONV Risk Score and Plan: 3 and Ondansetron, Dexamethasone and Treatment may vary due to age or medical condition  Airway Management Planned: Oral ETT  Additional Equipment:   Intra-op Plan:   Post-operative Plan: Extubation in OR  Informed Consent: I have reviewed the patients History and Physical, chart, labs and discussed the procedure including the risks, benefits and alternatives for the proposed anesthesia with the patient or authorized representative who has indicated his/her understanding and acceptance.   Dental advisory given  Plan Discussed with: CRNA  Anesthesia  Plan Comments:         Anesthesia Quick Evaluation

## 2018-05-01 NOTE — Transfer of Care (Signed)
Immediate Anesthesia Transfer of Care Note  [name]  Procedure(s) Performed: UMBILICAL HERNIA REPAIR (N/A Abdomen)  Patient Location: PACU  Anesthesia Type:General  Level of Consciousness: drowsy and patient cooperative  Airway & Oxygen Therapy: Patient Spontanous Breathing and Patient connected to face mask oxygen  Post-op Assessment: Report given to RN and Post -op Vital signs reviewed and stable  Post vital signs: Reviewed and stable  Last Vitals:  Vitals Value Taken Time  BP    Temp    Pulse 80 05/01/2018 11:40 AM  Resp 26 05/01/2018 11:40 AM  SpO2 100 % 05/01/2018 11:40 AM  Vitals shown include unvalidated device data.  Last Pain:  Vitals:   05/01/18 0724  TempSrc:   PainSc: 0-No pain      Patients Stated Pain Goal: 3 (46/21/94 7125)  Complications: No apparent anesthesia complications

## 2018-05-01 NOTE — Discharge Instructions (Signed)
Open Ventral Hernia Repair, Care After This sheet gives you information about how to care for yourself after your procedure. Your health care provider may also give you more specific instructions. If you have problems or questions, contact your health care provider. What can I expect after the procedure? After the procedure, it is common to have:  Pain, discomfort, or soreness.  Follow these instructions at home: Incision care  Follow instructions from your health care provider about how to take care of your incision. Make sure you: ? Wash your hands with soap and water before you change your bandage (dressing) or before you touch your abdomen. If soap and water are not available, use hand sanitizer. ? Change your dressing as told by your health care provider. ? Leave stitches (sutures), skin glue, or adhesive strips in place. These skin closures may need to stay in place for 2 weeks or longer. If adhesive strip edges start to loosen and curl up, you may trim the loose edges. Do not remove adhesive strips completely unless your health care provider tells you to do that.  Check your incision area every day for signs of infection. Check for: ? Redness, swelling, or pain. ? Fluid or blood. ? Warmth. ? Pus or a bad smell. Bathing  Do not take baths, swim, or use a hot tub until your health care provider approves. Ask your health care provider if you can take showers. You may only be allowed to take sponge baths for bathing.  Keep your bandage (dressing) dry until your health care provider says it can be removed. Activity  Do not lift anything that is heavier than 10 lb (4.5 kg) until your health care provider approves.  Do not drive or use heavy machinery while taking prescription pain medicine. Ask your health care provider when it is safe for you to drive or use heavy machinery.  Do not drive for 24 hours if you were given a medicine to help you relax (sedative) during your  procedure.  Rest as told by your health care provider. You may return to your normal activities when your health care provider approves. General instructions  Take over-the-counter and prescription medicines only as told by your health care provider.  To prevent or treat constipation while you are taking prescription pain medicine, your health care provider may recommend that you: ? Take over-the-counter or prescription medicines. ? Eat foods that are high in fiber, such as fresh fruits and vegetables, whole grains, and beans. ? Limit foods that are high in fat and processed sugars, such as fried and sweet foods.  Drink enough fluid to keep your urine clear or pale yellow.  Hold a pillow over your abdomen when you cough or sneeze. This helps with pain.  Keep all follow-up visits as told by your health care provider. This is important. Contact a health care provider if:  You have: ? A fever or chills. ? Redness, swelling, or pain around your incision. ? Fluid or blood coming from your incision. ? Pus or a bad smell coming from your incision. ? Pain that gets worse or does not get better with medicine. ? Nausea or vomiting. ? A cough. ? Shortness of breath.  Your incision feels warm to the touch.  You have not had a bowel movement in three days.  You are not able to urinate. Get help right away if:  You have severe pain in your abdomen.  You have persistent nausea and vomiting.  You have redness,  warmth, or pain in your leg.  You have chest pain.  You have trouble breathing. Summary  After this procedure, it is common to have pain, discomfort, or soreness.  Follow instructions from your health care provider about how to take care of your incision.  Check your incision area every day for signs of infection. Report any signs of infection to your health care provider.  Keep all follow-up visits as told by your health care provider. This is important. This information  is not intended to replace advice given to you by your health care provider. Make sure you discuss any questions you have with your health care provider.  Kathryne Eriksson. Dahlia Bailiff, MD, [place] [phone] [facility]

## 2018-05-01 NOTE — Anesthesia Procedure Notes (Signed)
Procedure Name: Intubation Date/Time: 05/01/2018 10:17 AM Performed by: Raenette Rover, CRNA Pre-anesthesia Checklist: Patient identified, Emergency Drugs available, Suction available, Patient being monitored and Timeout performed Patient Re-evaluated:Patient Re-evaluated prior to induction Oxygen Delivery Method: Circle system utilized Preoxygenation: Pre-oxygenation with 100% oxygen Induction Type: IV induction Ventilation: Oral airway inserted - appropriate to patient size Laryngoscope Size: Mac and 3 Grade View: Grade I Tube type: Oral Tube size: 7.0 mm Number of attempts: 1 Airway Equipment and Method: Patient positioned with wedge pillow and Oral airway Placement Confirmation: ETT inserted through vocal cords under direct vision,  positive ETCO2,  CO2 detector and breath sounds checked- equal and bilateral Secured at: 22 cm Dental Injury: Teeth and Oropharynx as per pre-operative assessment  Comments: ETT placed by Violet Baldy.

## 2018-05-01 NOTE — Op Note (Signed)
OPERATIVE REPORT  DATE OF OPERATION: 05/01/2018  PATIENT:  Dana [name]  52 y.o. female  PRE-OPERATIVE DIAGNOSIS:  SYMPTOMATIC UMBILICAL HERNIA  POST-OPERATIVE DIAGNOSIS:  SYMPTOMATIC UMBILICAL HERNIA  INDICATION(S) FOR OPERATION:  Symptomatic periumbilical hernia  FINDINGS:  Three hernias--two umbilical and one supraumbilical  PROCEDURE:  Procedure(s): UMBILICAL HERNIA REPAIR  SURGEON:  Surgeon(s): Judeth Horn, MD  ASSISTANT: None  ANESTHESIA:   general  COMPLICATIONS: None  EBL: 20 ml  BLOOD ADMINISTERED: none  DRAINS: none   SPECIMEN:  No Specimen  COUNTS CORRECT:  YES  PROCEDURE DETAILS: The patient was taken to the operating room and placed on the table in supine position.  After an adequate general endotracheal anesthetic was administered, she was prepped and draped in usual sterile manner exposing her abdomen.  A proper timeout was performed identifying the patient and the procedure to be performed.  We started with a supraumbilical incision and took it down to the midline deep periumbilical fascia.  Was at that site that we noted the protruding ventral hernia defect which was subsequently isolated.  Upon entering the peritoneal cavity through the hernia defect taken down the preperitoneal fat we able to uncover 2 subsequent hernias one in the direct posterior umbilical area and one just below that probably at the site of the previous laparoscopic incision.  We consolidated all the hernia defect through using electrocautery to connect these areas.  Excessive preperitoneal fat was excised.  Once we had one ventral hernia defect which probably is about 7 cm in length we closed it and repaired without mesh using interrupted #1 figure-of-eight stitches of Novafil.  We irrigated with saline after the repair.  We reapproximated the subcutaneous tissue using 3-0 Vicryl nondiet and sutures.  We then closed the skin using stainless steel staples.  All needle counts, sponge  counts, and instrument counts were correct.  A sterile dressing was applied.  PATIENT DISPOSITION:  PACU - hemodynamically stable.   Judeth Horn 8/16/201911:41 AM

## 2018-05-02 ENCOUNTER — Encounter (HOSPITAL_COMMUNITY): Payer: Self-pay | Admitting: General Surgery

## 2018-05-07 ENCOUNTER — Ambulatory Visit (INDEPENDENT_AMBULATORY_CARE_PROVIDER_SITE_OTHER): Payer: 59 | Admitting: Specialist

## 2018-05-07 ENCOUNTER — Encounter (INDEPENDENT_AMBULATORY_CARE_PROVIDER_SITE_OTHER): Payer: Self-pay | Admitting: Specialist

## 2018-05-07 VITALS — BP 128/88 | HR 68 | Ht 64.0 in | Wt 288.0 lb

## 2018-05-07 DIAGNOSIS — M48062 Spinal stenosis, lumbar region with neurogenic claudication: Secondary | ICD-10-CM | POA: Diagnosis not present

## 2018-05-07 NOTE — Progress Notes (Signed)
Office Visit Note   [name]           Date of Birth: [birth year]           MRN: [id number] Visit Date: 05/07/2018              Requested by: [name], MD [place], [place] [address] PCP: [name], MD   Assessment & Plan: Visit Diagnoses:  1. Spinal stenosis of lumbar region with neurogenic claudication     Plan: Avoid bending, stooping and avoid lifting weights greater than 10 lbs. Avoid prolong standing and walking. Avoid frequent bending and stooping  No lifting greater than 10 lbs. May use ice or moist heat for pain. Weight loss is of benefit. Handicap license is approved. Avoid bending, stooping and avoid lifting weights greater than 10 lbs. Avoid prolong standing and walking. Avoid frequent bending and stooping  No lifting greater than 10 lbs. May use ice or moist heat for pain. Weight loss is of benefit. Handicap license is approved. I recommend that you consider a decompressive laminectomy centrally at L4-5 and bilateral partial hemilaminectomies at L5-S1 nd L2-3 for decompression of lumbar spinal stenosis.Avoid bending, stooping and avoid lifting weights greater than 10 lbs. Avoid prolong standing and walking.  Take hydrocodone for for pain. Risk of surgery includes risk of infection 1 in 300 patients, bleeding 1/2% chance you would need a transfusion.   Risk to the nerves is one in 10,000.  Expect improved walking and standing tolerance. Expect relief of leg pain but numbness may persist depending on the length and degree of pressure that has been present.  I recommend that you consider a decompressive laminectomy centrally at L4-5 and bilateral partial hemilaminectomies at L5-S1 nd L2-3 for decompression of lumbar spinal stenosis.Avoid bending, stooping and avoid lifting weights greater than 10 lbs. Avoid prolong standing and walking.  Take hydrocodone for for pain. Risk of surgery includes risk of infection 1 in  300 patients, bleeding 1/2% chance you would need a transfusion.   Risk to the nerves is one in 10,000.  Expect improved walking and standing tolerance. Expect relief of leg pain but numbness may persist depending on the length and degree of pressure that has been present. Return in 8 weeks to discuss considering surgery, it takes at least 6 weaks for the abdomenal wall to heal following your recent abdomenal hernia surger  Follow-Up Instructions: No follow-ups on file.   Orders:  No orders of the defined types were placed in this encounter.  No orders of the defined types were placed in this encounter.     Procedures: No procedures performed   Clinical Data: No additional findings.   Subjective: Chief Complaint  Patient presents with  . Lower Back - Pain, Follow-up    MRI review    52 year old female with history of progressive increasing leg symptoms of numbness and burning pain into the buttocks and posterior legs and feet. She has undergone previous ESIs x 3 over 6 months and has not been to PT. She is having difficulty grocery shopping over the last one year. She stopped grocery shopping and stopped walking for exercise. Pain in the leg and back stiffness worse in the AM and with prolong standing and Walking. She does lean on the grocery cart with shopping. No bowel or bladder difficulties. She does experience pain with coughing and sneezing. It just hurts, I would like to just have a little relief.  She has tried gabapentin with some relief. Her primary doctor told her she has fibromyalgia and should see a rheumatologist. She has taken ibuprofen and also hydrocodone.   Review of Systems  Constitutional: Negative.   HENT: Negative.   Eyes: Negative.   Respiratory: Negative.   Cardiovascular: Negative.   Gastrointestinal: Negative.   Endocrine: Negative.   Genitourinary: Negative.   Musculoskeletal: Negative.   Skin: Negative.   Allergic/Immunologic: Negative.     Neurological: Negative.   Hematological: Negative.   Psychiatric/Behavioral: Negative.      Objective: Vital Signs: BP 128/88 (BP Location: Right Arm, Patient Position: Sitting, Cuff Size: Large) Comment (BP Location): forearm  Pulse 68   Ht 5\' 4"  (1.626 m)   Wt 288 lb (130.6 kg)   LMP 08/17/2012   BMI 49.44 kg/m   Physical Exam  Constitutional: She is oriented to person, place, and time. She appears well-developed and well-nourished.  HENT:  Head: Normocephalic and atraumatic.  Eyes: Pupils are equal, round, and reactive to light. EOM are normal.  Neck: Normal range of motion. Neck supple.  Pulmonary/Chest: Effort normal and breath sounds normal.  Abdominal: Soft. Bowel sounds are normal.  Neurological: She is alert and oriented to person, place, and time.  Skin: Skin is warm and dry.  Psychiatric: She has a normal mood and affect. Her behavior is normal. Judgment and thought content normal.    Back Exam   Tenderness  The patient is experiencing tenderness in the lumbar.  Range of Motion  Extension: abnormal  Flexion: abnormal  Lateral bend right: normal  Lateral bend left: normal  Rotation right: normal  Rotation left: normal   Muscle Strength  Right Quadriceps:  5/5  Left Quadriceps:  5/5  Right Hamstrings:  5/5  Left Hamstrings:  5/5   Tests  Straight leg raise right: negative Straight leg raise left: negative  Reflexes  Patellar: 0/4 Achilles: 0/4 Biceps: 0/4 Babinski's sign: normal   Other  Toe walk: normal Heel walk: normal Sensation: normal Gait: normal  Erythema: no back redness Scars: absent      Specialty Comments:  No specialty comments available.  Imaging: No results found.   PMFS History: Patient Active Problem List   Diagnosis Date Noted  . 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 Primary osteoarthritis of right knee 02/27/2017  . Lateral epicondylitis of right elbow 11/11/2016  . Muscle cramps 01/15/2016  .  Bilateral leg edema 01/08/2016  . Radiculopathy 12/20/2015  . Hyperglycemia 12/21/2014  . Mastodynia, female 11/30/2014  . S/P excision of fibroadenoma of breast 11/30/2014  . Angioedema of lips 04/07/2014  . Fibroadenoma of right breast 03/07/2014  . Pelvic pain in female 06/19/2012  . Menorrhagia 06/11/2012  . SUI (stress urinary incontinence, female) 06/11/2012  . HTN (hypertension) 06/11/2012  . IBS (irritable bowel syndrome) - diarrhea predominant 03/17/2012  . MICROSCOPIC HEMATURIA 11/30/2010  . BREAST PAIN, RIGHT 11/30/2010  . BREAST MASS, RIGHT 01/12/2010  . HYPERCHOLESTEROLEMIA 12/07/2007  . CIGARETTE SMOKER 12/07/2007  . DEGENERATIVE JOINT DISEASE 12/07/2007  . Low back pain with sciatica 12/07/2007  . HEADACHE 12/07/2007  . Obesity 08/03/2007  . ANXIETY 08/03/2007   Past Medical History:  Diagnosis Date  . Anxiety   . Back pain with radiation   . Breast mass, right   . Complication of anesthesia 04/07/14   Allergic reaction to Lisinopril immediately following surgery  . DJD (degenerative joint disease)   . Fibromyalgia   . Groin abscess   . Headache(784.0)   .  History of IBS   . Hypercholesterolemia   . IBS (irritable bowel syndrome)   . Lactose intolerance   . Mild hypertension   . Obesity   . Tobacco use disorder   . Umbilical hernia    Symptomatic    Family History  Problem Relation Age of Onset  . Diabetes Father   . Diabetes Mother   . Prostate cancer Paternal Grandfather   . Breast cancer Maternal Aunt 46    Past Surgical History:  Procedure Laterality Date  . ABDOMINAL HYSTERECTOMY    . ANTERIOR CERVICAL DECOMP/DISCECTOMY FUSION N/A 12/20/2015   Procedure: ANTERIOR CERVICAL DECOMPRESSION FUSION CERVICAL 4-5, CERVICAL 5-6, CERVICAL 6-7 WITH INSTRUMENTATION AND ALLOGRAFT;  Surgeon: Phylliss Bob, MD;  Location: [facility];  Service: Orthopedics;  Laterality: N/A;  Anterior cervical decompression fusion, cervical 4-5, cervical 5-6, cervical 6-7 with  instrumentation and allograft  . BACK SURGERY    . BILATERAL SALPINGECTOMY  09/03/2012   Procedure: BILATERAL SALPINGECTOMY;  Surgeon: [name], MD;  Location: [facility];  Service: Gynecology;  Laterality: Bilateral;  . BREAST BIOPSY Right 04/07/2014   Procedure: REMOVAL RIGHT BREAST MASS WITH WIRE LOCALIZATION;  Surgeon: [name], MD;  Location: [facility];  Service: General;  Laterality: Right;  . BREAST LUMPECTOMY     x2  . COLONOSCOPY W/ BIOPSIES  04/24/2012   per Dr. Carlean Purl, clear, repeat in 10 yrs   . disectomy    . ESOPHAGOGASTRODUODENOSCOPY    . FINGER SURGERY     Right index-excision of mass   . FOOT SURGERY Right    Bone Spurs  . LAPAROSCOPIC HYSTERECTOMY  09/03/2012   Procedure: HYSTERECTOMY TOTAL LAPAROSCOPIC;  Surgeon: [name], MD;  Location: [facility];  Service: Gynecology;  Laterality: N/A;  . LUMBAR [place]    . TUBAL LIGATION    . UMBILICAL HERNIA REPAIR N/A 05/01/2018   Procedure: UMBILICAL HERNIA REPAIR;  Surgeon: Judeth Horn, MD;  Location: [facility];  Service: General;  Laterality: N/A;   Social History   Occupational History  . Occupation: Optometrist  Tobacco Use  . Smoking status: Former Smoker    Years: 33.00    Types: Cigarettes  . Smokeless tobacco: Never Used  . Tobacco comment: quit March 2017  Substance and Sexual Activity  . Alcohol use: Yes    Alcohol/week: 0.0 standard drinks    Comment: occ  . Drug use: No    Types: Amphetamines  . Sexual activity: Yes    Birth control/protection: Surgical    Comment: tubal ligation

## 2018-05-07 NOTE — Patient Instructions (Addendum)
Avoid bending, stooping and avoid lifting weights greater than 10 lbs. Avoid prolong standing and walking. Avoid frequent bending and stooping  No lifting greater than 10 lbs. May use ice or moist heat for pain. Weight loss is of benefit. Handicap license is approved. I recommend that you consider a decompressive laminectomy centrally at L4-5 and bilateral partial hemilaminectomies at L5-S1 nd L2-3 for decompression of lumbar spinal stenosis.Avoid bending, stooping and avoid lifting weights greater than 10 lbs. Avoid prolong standing and walking.  Take hydrocodone for for pain. Risk of surgery includes risk of infection 1 in 300 patients, bleeding 1/2% chance you would need a transfusion.   Risk to the nerves is one in 10,000.  Expect improved walking and standing tolerance. Expect relief of leg pain but numbness may persist depending on the length and degree of pressure that has been present. Return in 8 weeks to discuss considering surgery, it takes at least 6 weaks for the abdomenal wall to heal following your recent abdomenal hernia surgery.

## 2018-06-04 ENCOUNTER — Telehealth (INDEPENDENT_AMBULATORY_CARE_PROVIDER_SITE_OTHER): Payer: Self-pay

## 2018-06-04 ENCOUNTER — Encounter (INDEPENDENT_AMBULATORY_CARE_PROVIDER_SITE_OTHER): Payer: Self-pay | Admitting: Surgery

## 2018-06-04 ENCOUNTER — Ambulatory Visit (INDEPENDENT_AMBULATORY_CARE_PROVIDER_SITE_OTHER): Payer: 59 | Admitting: Surgery

## 2018-06-04 DIAGNOSIS — M25561 Pain in right knee: Secondary | ICD-10-CM | POA: Diagnosis not present

## 2018-06-04 DIAGNOSIS — M1711 Unilateral primary osteoarthritis, right knee: Secondary | ICD-10-CM

## 2018-06-04 DIAGNOSIS — G8929 Other chronic pain: Secondary | ICD-10-CM | POA: Diagnosis not present

## 2018-06-04 MED ORDER — METHYLPREDNISOLONE ACETATE 40 MG/ML IJ SUSP
40.0000 mg | INTRAMUSCULAR | Status: AC | PRN
  Administered 2018-06-04: 40 mg via INTRA_ARTICULAR

## 2018-06-04 MED ORDER — LIDOCAINE HCL 1 % IJ SOLN
3.0000 mL | INTRAMUSCULAR | Status: AC | PRN
  Administered 2018-06-04: 3 mL

## 2018-06-04 MED ORDER — BUPIVACAINE HCL 0.25 % IJ SOLN
6.0000 mL | INTRAMUSCULAR | Status: AC | PRN
  Administered 2018-06-04: 6 mL via INTRA_ARTICULAR

## 2018-06-04 NOTE — Telephone Encounter (Signed)
Noted  

## 2018-06-04 NOTE — Telephone Encounter (Signed)
Can you please see if you can get patient approved for gel series for her right knee? Jeneen Rinks saw her today.

## 2018-06-04 NOTE — Progress Notes (Signed)
Office Visit Note   [name]           Date of Birth: October 19, 1965           MRN: [id number] Visit Date: 06/04/2018              Requested by: [name], MD [place], [place] [address] PCP: [name], MD   Assessment & Plan: Visit Diagnoses:  1. Arthritis of right knee   2. Chronic pain of right knee     Plan: Today I did elect to repeat right knee Marcaine/Depo-Medrol injection.  She will follow-up me in 3 weeks for recheck and hopefully we can get approval for Orthovisc series.  She understands that definitive treatment would be total knee replacement but hopefully we can at least get things more tolerable since she is also needing lumbar surgery with Dr. Louanne Skye.    Follow-Up Instructions: Return in about 3 weeks (around 06/25/2018) for With Jeneen Rinks to start Orthovisc series.   Orders:  No orders of the defined types were placed in this encounter.  No orders of the defined types were placed in this encounter.     Procedures: Large Joint Inj on 06/04/2018 2:51 PM Indications: pain and joint swelling Details: 25 G 1.5 in needle, anterolateral approach  Arthrogram: No  Medications: 3 mL lidocaine 1 %; 6 mL bupivacaine 0.25 %; 40 mg methylPREDNISolone acetate 40 MG/ML Consent was given by the patient. Patient was prepped and draped in the usual sterile fashion.       Clinical Data: No additional findings.   Subjective: Chief Complaint  Patient presents with  . Right Knee - Pain    HPI 52 year old black female history of right knee DJD comes in with complaints of knee pain and swelling.  March 10, 2018 she had intra-articular right knee injection by me and states that this gave temporary relief.  She has been stressing the knee more having to walk around [place] a lot recently since her son had surgery.  Needs kidney transplant.  She is also needing lumbar surgery with Dr. Louanne Skye and states that she is planning on having  this done possibly sometime early next year. Review of Systems No current cardiac pulmonary GI Gu issues.   Objective: Vital Signs: LMP 08/17/2012   Physical Exam  Constitutional: She is oriented to person, place, and time. No distress.  HENT:  Head: Normocephalic and atraumatic.  Eyes: Pupils are equal, round, and reactive to light. EOM are normal.  Musculoskeletal:  Gait antalgic.  Right knee decreased range of motion.  Positive effusion.  Positive crepitus.  Joint line  tender.  Neurological: She is alert and oriented to person, place, and time.  Skin: Skin is dry.  Psychiatric: She has a normal mood and affect.    Ortho Exam  Specialty Comments:  No specialty comments available.  Imaging: No results found.   PMFS History: Patient Active Problem List   Diagnosis Date Noted  . 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 Primary osteoarthritis of right knee 02/27/2017  . Lateral epicondylitis of right elbow 11/11/2016  . Muscle cramps 01/15/2016  . Bilateral leg edema 01/08/2016  . Radiculopathy 12/20/2015  . Hyperglycemia 12/21/2014  . Mastodynia, female 11/30/2014  . S/P excision of fibroadenoma of breast 11/30/2014  . Angioedema of lips 04/07/2014  . Fibroadenoma of right breast 03/07/2014  . Pelvic pain in female 06/19/2012  . Menorrhagia 06/11/2012  . SUI (stress urinary  incontinence, female) 06/11/2012  . HTN (hypertension) 06/11/2012  . IBS (irritable bowel syndrome) - diarrhea predominant 03/17/2012  . MICROSCOPIC HEMATURIA 11/30/2010  . BREAST PAIN, RIGHT 11/30/2010  . BREAST MASS, RIGHT 01/12/2010  . HYPERCHOLESTEROLEMIA 12/07/2007  . CIGARETTE SMOKER 12/07/2007  . DEGENERATIVE JOINT DISEASE 12/07/2007  . Low back pain with sciatica 12/07/2007  . HEADACHE 12/07/2007  . Obesity 08/03/2007  . ANXIETY 08/03/2007   Past Medical History:  Diagnosis Date  . Anxiety   . Back pain with radiation   . Breast mass, right   . Complication of anesthesia  04/07/14   Allergic reaction to Lisinopril immediately following surgery  . DJD (degenerative joint disease)   . Fibromyalgia   . Groin abscess   . Headache(784.0)   . History of IBS   . Hypercholesterolemia   . IBS (irritable bowel syndrome)   . Lactose intolerance   . Mild hypertension   . Obesity   . Tobacco use disorder   . Umbilical hernia    Symptomatic    Family History  Problem Relation Age of Onset  . Diabetes Father   . Diabetes Mother   . Prostate cancer Paternal Grandfather   . Breast cancer Maternal Aunt 46    Past Surgical History:  Procedure Laterality Date  . ABDOMINAL HYSTERECTOMY    . ANTERIOR CERVICAL DECOMP/DISCECTOMY FUSION N/A 12/20/2015   Procedure: ANTERIOR CERVICAL DECOMPRESSION FUSION CERVICAL 4-5, CERVICAL 5-6, CERVICAL 6-7 WITH INSTRUMENTATION AND ALLOGRAFT;  Surgeon: Phylliss Bob, MD;  Location: [facility];  Service: Orthopedics;  Laterality: N/A;  Anterior cervical decompression fusion, cervical 4-5, cervical 5-6, cervical 6-7 with instrumentation and allograft  . BACK SURGERY    . BILATERAL SALPINGECTOMY  09/03/2012   Procedure: BILATERAL SALPINGECTOMY;  Surgeon: [name], MD;  Location: [facility];  Service: Gynecology;  Laterality: Bilateral;  . BREAST BIOPSY Right 04/07/2014   Procedure: REMOVAL RIGHT BREAST MASS WITH WIRE LOCALIZATION;  Surgeon: [name], MD;  Location: [facility];  Service: General;  Laterality: Right;  . BREAST LUMPECTOMY     x2  . COLONOSCOPY W/ BIOPSIES  04/24/2012   per Dr. Carlean Purl, clear, repeat in 10 yrs   . disectomy    . ESOPHAGOGASTRODUODENOSCOPY    . FINGER SURGERY     Right index-excision of mass   . FOOT SURGERY Right    Bone Spurs  . LAPAROSCOPIC HYSTERECTOMY  09/03/2012   Procedure: HYSTERECTOMY TOTAL LAPAROSCOPIC;  Surgeon: [name], MD;  Location: [facility];  Service: Gynecology;  Laterality: N/A;  . LUMBAR [place]    . TUBAL LIGATION    . UMBILICAL HERNIA REPAIR N/A 05/01/2018   Procedure:  UMBILICAL HERNIA REPAIR;  Surgeon: Judeth Horn, MD;  Location: [facility];  Service: General;  Laterality: N/A;   Social History   Occupational History  . Occupation: Optometrist  Tobacco Use  . Smoking status: Former Smoker    Years: 33.00    Types: Cigarettes  . Smokeless tobacco: Never Used  . Tobacco comment: quit March 2017  Substance and Sexual Activity  . Alcohol use: Yes    Alcohol/week: 0.0 standard drinks    Comment: occ  . Drug use: No    Types: Amphetamines  . Sexual activity: Yes    Birth control/protection: Surgical    Comment: tubal ligation

## 2018-06-05 ENCOUNTER — Telehealth (INDEPENDENT_AMBULATORY_CARE_PROVIDER_SITE_OTHER): Payer: Self-pay

## 2018-06-05 NOTE — Telephone Encounter (Signed)
Submitted VOB for Synvisc series, right knee. 

## 2018-06-09 ENCOUNTER — Telehealth: Payer: Self-pay | Admitting: Family Medicine

## 2018-06-09 ENCOUNTER — Other Ambulatory Visit: Payer: Self-pay | Admitting: Family Medicine

## 2018-06-09 DIAGNOSIS — N644 Mastodynia: Secondary | ICD-10-CM

## 2018-06-09 NOTE — Telephone Encounter (Signed)
We examined her in August for the left breast pain. I just ordered a bilateral diagnostic mammogram and a left breast US. Somehow there is also an erroneous order in the computer for a mammogram from a  Kermit Balo DO. Please cancel this order

## 2018-06-09 NOTE — Telephone Encounter (Signed)
Copied from [place] [phone]. Topic: Referral - Request >> Jun 09, 2018 10:13 AM Janace Aris A wrote: Reason for CRM: Patient called in requesting a referral to the breast center on [facility]. Patient says she needs an ultrasound  because she is still having sharp pains. [phone] is the number to the Facility.  Please Advise

## 2018-06-09 NOTE — Addendum Note (Signed)
Addended by: [name] A on: 06/09/2018 12:53 PM   Modules accepted: Orders

## 2018-06-09 NOTE — Telephone Encounter (Signed)
Dr. Sarajane Jews please advise on the referral to the breast center for [place] of breast due to recurrent pain.  Thanks

## 2018-06-10 ENCOUNTER — Other Ambulatory Visit: Payer: Self-pay | Admitting: Family Medicine

## 2018-06-10 DIAGNOSIS — N644 Mastodynia: Secondary | ICD-10-CM

## 2018-06-11 NOTE — Telephone Encounter (Signed)
Pt is scheduled for the diagnostic mammogram and [place] for 9/27

## 2018-06-12 ENCOUNTER — Other Ambulatory Visit: Payer: Self-pay

## 2018-06-12 ENCOUNTER — Inpatient Hospital Stay: Admission: RE | Admit: 2018-06-12 | Payer: Self-pay | Source: Ambulatory Visit

## 2018-06-18 ENCOUNTER — Telehealth (INDEPENDENT_AMBULATORY_CARE_PROVIDER_SITE_OTHER): Payer: Self-pay | Admitting: Surgery

## 2018-06-18 NOTE — Telephone Encounter (Signed)
Patient called checking on the status of the approval for the gel injections. Please call with update if available. Patient's call back # [phone]

## 2018-06-19 ENCOUNTER — Other Ambulatory Visit: Payer: Self-pay | Admitting: Student

## 2018-06-19 DIAGNOSIS — K429 Umbilical hernia without obstruction or gangrene: Secondary | ICD-10-CM

## 2018-06-22 ENCOUNTER — Ambulatory Visit
Admission: RE | Admit: 2018-06-22 | Discharge: 2018-06-22 | Disposition: A | Discharge status: Home / Self Care | Payer: 59 | Source: Ambulatory Visit | Attending: Student | Admitting: Student

## 2018-06-22 DIAGNOSIS — R110 Nausea: Secondary | ICD-10-CM | POA: Diagnosis not present

## 2018-06-22 DIAGNOSIS — K429 Umbilical hernia without obstruction or gangrene: Secondary | ICD-10-CM

## 2018-06-22 MED ORDER — IOPAMIDOL (ISOVUE-300) INJECTION 61%
125.0000 mL | Freq: Once | INTRAVENOUS | Status: AC | PRN
  Administered 2018-06-22: 125 mL via INTRAVENOUS

## 2018-06-23 ENCOUNTER — Ambulatory Visit: Payer: Self-pay

## 2018-06-23 ENCOUNTER — Ambulatory Visit
Admission: RE | Admit: 2018-06-23 | Discharge: 2018-06-23 | Disposition: A | Discharge status: Home / Self Care | Payer: 59 | Source: Ambulatory Visit | Attending: Family Medicine | Admitting: Family Medicine

## 2018-06-23 DIAGNOSIS — N644 Mastodynia: Secondary | ICD-10-CM

## 2018-06-23 DIAGNOSIS — R928 Other abnormal and inconclusive findings on diagnostic imaging of breast: Secondary | ICD-10-CM | POA: Diagnosis not present

## 2018-06-24 ENCOUNTER — Telehealth (INDEPENDENT_AMBULATORY_CARE_PROVIDER_SITE_OTHER): Payer: Self-pay

## 2018-06-24 NOTE — Telephone Encounter (Signed)
Called and left a VM advising patient that a PA is needed for SynviscOne injection and once approved, I will give her a call back.

## 2018-06-24 NOTE — Telephone Encounter (Signed)
Error

## 2018-06-24 NOTE — Telephone Encounter (Deleted)
Submitted VOB for Monovisc, right knee. 

## 2018-06-26 ENCOUNTER — Telehealth (INDEPENDENT_AMBULATORY_CARE_PROVIDER_SITE_OTHER): Payer: Self-pay

## 2018-06-26 NOTE — Telephone Encounter (Signed)
Patient is approved for SynviscOne injection, right knee. Buy & Bill Covered at 100% through her insurance  No Co-pay PA Required Approval# [id number] Valid 06/26/2018- 12/27/2018 per [place].  Appt.07/09/2018

## 2018-07-09 ENCOUNTER — Encounter (INDEPENDENT_AMBULATORY_CARE_PROVIDER_SITE_OTHER): Payer: Self-pay | Admitting: Specialist

## 2018-07-09 ENCOUNTER — Ambulatory Visit (INDEPENDENT_AMBULATORY_CARE_PROVIDER_SITE_OTHER): Payer: 59 | Admitting: Specialist

## 2018-07-09 VITALS — BP 151/75 | HR 96 | Ht 64.0 in | Wt 288.0 lb

## 2018-07-09 DIAGNOSIS — M48062 Spinal stenosis, lumbar region with neurogenic claudication: Secondary | ICD-10-CM

## 2018-07-09 DIAGNOSIS — M1712 Unilateral primary osteoarthritis, left knee: Secondary | ICD-10-CM

## 2018-07-09 DIAGNOSIS — M1711 Unilateral primary osteoarthritis, right knee: Secondary | ICD-10-CM | POA: Diagnosis not present

## 2018-07-09 DIAGNOSIS — M170 Bilateral primary osteoarthritis of knee: Secondary | ICD-10-CM

## 2018-07-09 MED ORDER — BUPIVACAINE HCL 0.25 % IJ SOLN
4.0000 mL | INTRAMUSCULAR | Status: AC | PRN
  Administered 2018-07-09: 4 mL via INTRA_ARTICULAR

## 2018-07-09 MED ORDER — METHYLPREDNISOLONE ACETATE 40 MG/ML IJ SUSP
40.0000 mg | INTRAMUSCULAR | Status: AC | PRN
  Administered 2018-07-09: 40 mg via INTRA_ARTICULAR

## 2018-07-09 MED ORDER — HYLAN G-F 20 48 MG/6ML IX SOSY
48.0000 mg | PREFILLED_SYRINGE | INTRA_ARTICULAR | Status: AC | PRN
  Administered 2018-07-09: 48 mg via INTRA_ARTICULAR

## 2018-07-09 NOTE — Progress Notes (Signed)
Office Visit Note   [name]           Date of Birth: 1965/10/03           MRN: [id number] Visit Date: 07/09/2018              Requested by: [name], MD [place], [place] [id number] PCP: [name], MD   Assessment & Plan: Visit Diagnoses:  1. Bilateral primary osteoarthritis of knee   2. Spinal stenosis of lumbar region with neurogenic claudication     Plan:Knee is suffering from osteoarthritis, only real proven treatments are Weight loss, NSIADs like diclofenac and exercise. Well padded shoes help. Ice the knee 2-3 times a day 15-20 mins at a time. Avoid bending, stooping and avoid lifting weights greater than 10 lbs. Avoid prolong standing and walking. Avoid frequent bending and stooping  No lifting greater than 10 lbs. May use ice or moist heat for pain. Weight loss is of benefit. Handicap license is approved.   Follow-Up Instructions: Return in about 6 weeks (around 08/20/2018).   Orders:  Orders Placed This Encounter  Procedures  . Large Joint Inj: R knee  . Large Joint Inj: L knee   No orders of the defined types were placed in this encounter.     Procedures: Large Joint Inj: R knee on 07/09/2018 3:36 PM Indications: pain Details: 25 G 1.5 in needle, anterolateral approach  Arthrogram: No  Medications: 4 mL bupivacaine 0.25 %; 48 mg Hylan 48 MG/6ML Outcome: tolerated well, no immediate complications  Bandaid applied Procedure, treatment alternatives, risks and benefits explained, specific risks discussed. Consent was given by the patient. Immediately prior to procedure a time out was called to verify the correct patient, procedure, equipment, support staff and site/side marked as required. Patient was prepped and draped in the usual sterile fashion.   Large Joint Inj: L knee on 07/09/2018 3:37 PM Indications: pain Details: 25 G 1.5 in needle, anterolateral approach  Arthrogram: No  Medications: 40 mg  methylPREDNISolone acetate 40 MG/ML; 4 mL bupivacaine 0.25 % Outcome: tolerated well, no immediate complications  bandaid applied Procedure, treatment alternatives, risks and benefits explained, specific risks discussed. Consent was given by the patient. Immediately prior to procedure a time out was called to verify the correct patient, procedure, equipment, support staff and site/side marked as required. Patient was prepped and draped in the usual sterile fashion.       Clinical Data: No additional findings.   Subjective: Chief Complaint  Patient presents with  . Right Knee - Follow-up    Here for Synvisc one injection    52 year old female with Body mass index is 49.44 kg/m. She is experiencing night pain in both knees and has had right knee intraarticular steriod without much benefit. She has done McConnell exercises, radiographs with  Severe lateral less than medial joint osteoarthritis. She is returning for a right knee synvisc injection but also desires a left knee Intraarticular steroid injection.    Review of Systems  Constitutional: Negative.   HENT: Negative.   Eyes: Negative.   Respiratory: Negative.   Cardiovascular: Negative.   Gastrointestinal: Negative.   Endocrine: Negative.   Genitourinary: Negative.   Musculoskeletal: Negative.   Skin: Negative.   Allergic/Immunologic: Negative.   Neurological: Negative.   Hematological: Negative.   Psychiatric/Behavioral: Negative.      Objective: Vital Signs: BP (!) 151/75 (BP Location: Left Arm, Patient Position: Sitting)   Pulse 96  Ht 5\' 4"  (1.626 m)   Wt 288 lb (130.6 kg)   LMP 08/17/2012   BMI 49.44 kg/m   Physical Exam  Constitutional: She is oriented to person, place, and time. She appears well-developed and well-nourished.  HENT:  Head: Normocephalic and atraumatic.  Eyes: Pupils are equal, round, and reactive to light. EOM are normal.  Neck: Normal range of motion. Neck supple.  Pulmonary/Chest:  Effort normal and breath sounds normal.  Abdominal: Soft. Bowel sounds are normal.  Musculoskeletal: Normal range of motion.  Neurological: She is alert and oriented to person, place, and time.  Skin: Skin is warm and dry.  Psychiatric: She has a normal mood and affect. Her behavior is normal. Judgment and thought content normal.    Right Knee Exam   Tenderness  The patient is experiencing tenderness in the medial retinaculum, patella and medial joint line.  Range of Motion  Extension: 0  Flexion: 130    Left Knee Exam   Tenderness  The patient is experiencing tenderness in the medial retinaculum, patella and medial joint line.  Range of Motion  Extension: -5  Flexion: 130       Specialty Comments:  No specialty comments available.  Imaging: No results found.   PMFS History: Patient Active Problem List   Diagnosis Date Noted  . 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 Primary osteoarthritis of right knee 02/27/2017  . Lateral epicondylitis of right elbow 11/11/2016  . Muscle cramps 01/15/2016  . Bilateral leg edema 01/08/2016  . Radiculopathy 12/20/2015  . Hyperglycemia 12/21/2014  . Mastodynia, female 11/30/2014  . S/P excision of fibroadenoma of breast 11/30/2014  . Angioedema of lips 04/07/2014  . Fibroadenoma of right breast 03/07/2014  . Pelvic pain in female 06/19/2012  . Menorrhagia 06/11/2012  . SUI (stress urinary incontinence, female) 06/11/2012  . HTN (hypertension) 06/11/2012  . IBS (irritable bowel syndrome) - diarrhea predominant 03/17/2012  . MICROSCOPIC HEMATURIA 11/30/2010  . BREAST PAIN, RIGHT 11/30/2010  . BREAST MASS, RIGHT 01/12/2010  . HYPERCHOLESTEROLEMIA 12/07/2007  . CIGARETTE SMOKER 12/07/2007  . DEGENERATIVE JOINT DISEASE 12/07/2007  . Low back pain with sciatica 12/07/2007  . HEADACHE 12/07/2007  . Obesity 08/03/2007  . ANXIETY 08/03/2007   Past Medical History:  Diagnosis Date  . Anxiety   . Back pain with  radiation   . Breast mass, right   . Complication of anesthesia 04/07/14   Allergic reaction to Lisinopril immediately following surgery  . DJD (degenerative joint disease)   . Fibromyalgia   . Groin abscess   . Headache(784.0)   . History of IBS   . Hypercholesterolemia   . IBS (irritable bowel syndrome)   . Lactose intolerance   . Mild hypertension   . Obesity   . Tobacco use disorder   . Umbilical hernia    Symptomatic    Family History  Problem Relation Age of Onset  . Diabetes Father   . Diabetes Mother   . Prostate cancer Paternal Grandfather   . Breast cancer Maternal Aunt 46    Past Surgical History:  Procedure Laterality Date  . ABDOMINAL HYSTERECTOMY    . ANTERIOR CERVICAL DECOMP/DISCECTOMY FUSION N/A 12/20/2015   Procedure: ANTERIOR CERVICAL DECOMPRESSION FUSION CERVICAL 4-5, CERVICAL 5-6, CERVICAL 6-7 WITH INSTRUMENTATION AND ALLOGRAFT;  Surgeon: Phylliss Bob, MD;  Location: [facility];  Service: Orthopedics;  Laterality: N/A;  Anterior cervical decompression fusion, cervical 4-5, cervical 5-6, cervical 6-7 with instrumentation and allograft  . BACK SURGERY    .  BILATERAL SALPINGECTOMY  09/03/2012   Procedure: BILATERAL SALPINGECTOMY;  Surgeon: [name], MD;  Location: [facility];  Service: Gynecology;  Laterality: Bilateral;  . BREAST BIOPSY Right 04/07/2014   Procedure: REMOVAL RIGHT BREAST MASS WITH WIRE LOCALIZATION;  Surgeon: [name], MD;  Location: [facility];  Service: General;  Laterality: Right;  . BREAST EXCISIONAL BIOPSY Left    x2  . BREAST LUMPECTOMY     x2  . COLONOSCOPY W/ BIOPSIES  04/24/2012   per Dr. Carlean Purl, clear, repeat in 10 yrs   . disectomy    . ESOPHAGOGASTRODUODENOSCOPY    . FINGER SURGERY     Right index-excision of mass   . FOOT SURGERY Right    Bone Spurs  . LAPAROSCOPIC HYSTERECTOMY  09/03/2012   Procedure: HYSTERECTOMY TOTAL LAPAROSCOPIC;  Surgeon: [name], MD;  Location: [facility];  Service: Gynecology;  Laterality:  N/A;  . LUMBAR [place]    . TUBAL LIGATION    . UMBILICAL HERNIA REPAIR N/A 05/01/2018   Procedure: UMBILICAL HERNIA REPAIR;  Surgeon: Judeth Horn, MD;  Location: [facility];  Service: General;  Laterality: N/A;   Social History   Occupational History  . Occupation: Optometrist  Tobacco Use  . Smoking status: Former Smoker    Years: 33.00    Types: Cigarettes  . Smokeless tobacco: Never Used  . Tobacco comment: quit March 2017  Substance and Sexual Activity  . Alcohol use: Yes    Alcohol/week: 0.0 standard drinks    Comment: occ  . Drug use: No    Types: Amphetamines  . Sexual activity: Yes    Birth control/protection: Surgical    Comment: tubal ligation

## 2018-07-09 NOTE — Patient Instructions (Signed)
  Knee is suffering from osteoarthritis, only real proven treatments are Weight loss, NSIADs like diclofenac and exercise. Well padded shoes help. Ice the knee 2-3 times a day 15-20 mins at a time. Avoid bending, stooping and avoid lifting weights greater than 10 lbs. Avoid prolong standing and walking. Avoid frequent bending and stooping  No lifting greater than 10 lbs. May use ice or moist heat for pain. Weight loss is of benefit. Handicap license is approved.   

## 2018-07-25 ENCOUNTER — Ambulatory Visit (INDEPENDENT_AMBULATORY_CARE_PROVIDER_SITE_OTHER): Payer: 59 | Admitting: Family Medicine

## 2018-07-25 ENCOUNTER — Encounter: Payer: Self-pay | Admitting: Family Medicine

## 2018-07-25 VITALS — BP 155/90 | HR 105 | Temp 98.2°F | Resp 16 | Ht 64.0 in | Wt 298.0 lb

## 2018-07-25 DIAGNOSIS — J029 Acute pharyngitis, unspecified: Secondary | ICD-10-CM | POA: Diagnosis not present

## 2018-07-25 DIAGNOSIS — I10 Essential (primary) hypertension: Secondary | ICD-10-CM

## 2018-07-25 DIAGNOSIS — J020 Streptococcal pharyngitis: Secondary | ICD-10-CM | POA: Diagnosis not present

## 2018-07-25 LAB — POCT RAPID STREP A (OFFICE): RAPID STREP A SCREEN: POSITIVE — AB

## 2018-07-25 MED ORDER — AZITHROMYCIN 250 MG PO TABS: ORAL_TABLET | ORAL | 0 refills | Status: DC

## 2018-07-25 NOTE — Progress Notes (Addendum)
PCP: [name], MD  Subjective:  Dana [name] is a 52 y.o. year old very pleasant female patient who presents with  symptoms including sore throat, fatigue, subjective elevated temperature. Sore throat is severe aching sensation. Not able to eat well. She also has rather significant sinus pressure/congestion in frontal sinuses with yellow discharge.   She also has sinus pressure, right ear aching. Nasal discharge has had blood mixed in.   -started: 6 days ago, symptoms are worsening -previous treatments: mucinex -sick contacts/travel/risks: denies flu exposure. Denies Known strep exposure  ROS-denies chest pain. Some mild SOB- at her baseline (obesity, sedentary activity), tooth pain. Vomited once last night- no blood or bile  Pertinent Past Medical History-  Patient Active Problem List   Diagnosis Date Noted  . 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 Primary osteoarthritis of right knee 02/27/2017  . Lateral epicondylitis of right elbow 11/11/2016  . Muscle cramps 01/15/2016  . Bilateral leg edema 01/08/2016  . Radiculopathy 12/20/2015  . Hyperglycemia 12/21/2014  . Mastodynia, female 11/30/2014  . S/P excision of fibroadenoma of breast 11/30/2014  . Angioedema of lips 04/07/2014  . Fibroadenoma of right breast 03/07/2014  . Pelvic pain in female 06/19/2012  . Menorrhagia 06/11/2012  . SUI (stress urinary incontinence, female) 06/11/2012  . HTN (hypertension) 06/11/2012  . IBS (irritable bowel syndrome) - diarrhea predominant 03/17/2012  . MICROSCOPIC HEMATURIA 11/30/2010  . BREAST PAIN, RIGHT 11/30/2010  . BREAST MASS, RIGHT 01/12/2010  . HYPERCHOLESTEROLEMIA 12/07/2007  . CIGARETTE SMOKER 12/07/2007  . DEGENERATIVE JOINT DISEASE 12/07/2007  . Low back pain with sciatica 12/07/2007  . HEADACHE 12/07/2007  . Obesity 08/03/2007  . ANXIETY 08/03/2007    Medications- reviewed  Current Outpatient Medications  Medication Sig Dispense Refill  . ALPRAZolam  (XANAX) 0.5 MG tablet alprazolam 0.5 mg tablet  TAKE A HALF TO ONE TABLET BY MOUTH 3 TIMES A DAY AS NEEDED    . atorvastatin (LIPITOR) 20 MG tablet Take 1 tablet (20 mg total) by mouth daily. 90 tablet 3  . Cholecalciferol 50000 units TABS Take 1 tablet by mouth 2 (two) times a week. 16 tablet 0  . cyclobenzaprine (FLEXERIL) 10 MG tablet Take 10 mg by mouth once daily at bedtime 90 tablet 3  . dorzolamide-timolol (COSOPT) 22.3-6.8 MG/ML ophthalmic solution Place 1 drop into both eyes 2 (two) times daily.    . furosemide (LASIX) 40 MG tablet Take 2 tablets in the morning and 1 tablet in the evening 90 tablet 5  . gabapentin (NEURONTIN) 300 MG capsule Start with 1 tab po qhs X 1 week, then increase to 1 tab po bid X 1 week then 1 tab po tid prn (Patient taking differently: Take 300 mg by mouth at bedtime. ) 90 capsule 1  . hydrALAZINE (APRESOLINE) 25 MG tablet Take 1 tablet (25 mg total) by mouth 3 (three) times daily. 90 tablet 5  . hydrochlorothiazide (MICROZIDE) 12.5 MG capsule hydrochlorothiazide 12.5 mg capsule    . HYDROmorphone (DILAUDID) 4 MG tablet Take 1 tablet (4 mg total) by mouth every 6 (six) hours as needed for moderate pain or severe pain. 10 tablet 0  . ibuprofen (ADVIL,MOTRIN) 200 MG tablet Take 400 mg by mouth daily as needed for headache or moderate pain.    [name] ketorolac (ACULAR) 0.5 % ophthalmic solution Place 1 drop into the left eye 4 (four) times daily.    [name] latanoprost (XALATAN) 0.005 % ophthalmic solution Place 1 drop into both eyes at bedtime.    [name]  nabumetone (RELAFEN) 500 MG tablet Take 1 tablet (500 mg total) by mouth 2 (two) times daily. 60 tablet 6  . ondansetron (ZOFRAN) 8 MG tablet Take 1 tablet (8 mg total) by mouth every 8 (eight) hours as needed for nausea or vomiting. 30 tablet 0  . potassium chloride (KLOR-CON 10) 10 MEQ tablet Take 1 tablet (10 mEq total) by mouth 2 (two) times daily. 60 tablet 11  . triamcinolone cream (KENALOG) 0.1 % Apply 1 application  topically daily as needed (for itching).   3   No current facility-administered medications for this visit.     Objective: BP (!) 155/90 (BP Location: Left Arm, Patient Position: Sitting, Cuff Size: Large)   Pulse (!) 105   Temp 98.2 F (36.8 C) (Oral)   Resp 16   Ht 5\' 4"  (1.626 m)   Wt 298 lb (135.2 kg)   LMP 08/17/2012   SpO2 97%   BMI 51.15 kg/m  Gen: NAD, appears fatigued HEENT: Turbinates erythematous with yellow discharge, TM normal, pharynx markedly erythematous. Tonsils not present. Noted frontal  sinus tenderness CV: RRR no murmurs rubs or gallops Lungs: CTAB no crackles, wheeze, rhonchi Ext: no edema Skin: warm, dry, no rash  Results for orders placed or performed in visit on 07/25/18 (from the past 24 hour(s))  POCT rapid strep A     Status: Abnormal   Collection Time: 07/25/18 11:32 AM  Result Value Ref Range   Rapid Strep A Screen Positive (A) Negative   Assessment/Plan:  Streptococcal pharyngitis History and exam today are suggestive of bacterial infection due to group A streptococcus.  Antibiotic: Azithromycin due to penicillin allergy Also with fair amount of sinus pressure- if this is early bacterial sinusitis hopeful will resolve with azithromycin- if not should follow up with Dr. Sarajane Jews next week. Would have preferred augmentin but has facial swelling with PCN per her report.   Symptomatic treatment with: salt water gargles, can schedule tylenol 650 mg every 6 hours (as long as no rash with it), sore throat lozenges  Advised contagious until at least 24 hours fever free and that treatment usually effective within 24-48 hours  Finally, we reviewed reasons to return to care including if symptoms worsen or persist or new concerns arise-particularly worsening pain or fever curve.   HTN (hypertension) S: controlled poorly on last 2 checks. She hasnt been monitoring at home either. Does admit to not taking medicine yet today BP Readings from Last 3 Encounters:   07/25/18 (!) 155/90  07/09/18 (!) 151/75  05/07/18 128/88  A/P: We discussed blood pressure goal of <140/90. I advised her to follow up with Dr. Sarajane Jews in next few weeks after doing some home monitoring. Asked her to take meds as soon as she gets home (looks like hydralazine and hctz) but she mentions maxzide possibly. She may have to see Dr. Sarajane Jews this week if sinuses not improved as symptoms concerning to me for sinusitis as well     Meds ordered this encounter  Medications  . azithromycin (ZITHROMAX) 250 MG tablet    Sig: Take 2 tabs on day 1, then 1 tab daily until finished    Dispense:  6 tablet    Refill:  0   Garret Reddish, MD

## 2018-07-25 NOTE — Patient Instructions (Addendum)
You have strep throat.  We strongly consider using Augmentin for this as you also have a possible sinus infection.  Unfortunately you have a penicillin allergy with facial swelling so this is not the best idea.  The next best option is azithromycin which she will take for 5 days.  This should adequately treat the strep throat.  There is 20% resistance in our community or so to the azithromycin for sinus infections so if you are not feeling better by early next week I want you to follow-up with Dr. Sarajane Jews  Symptomatic treatment with: salt water gargles, can schedule tylenol 650 mg every 6 hours (as long as no rash with it), sore throat lozenges  Finally, we reviewed reasons to return to care including if symptoms worsen or persist or new concerns arise-particularly worsening pain or fever curve.

## 2018-07-25 NOTE — Assessment & Plan Note (Signed)
S: controlled poorly on last 2 checks. She hasnt been monitoring at home either. Does admit to not taking medicine yet today BP Readings from Last 3 Encounters:  07/25/18 (!) 155/90  07/09/18 (!) 151/75  05/07/18 128/88  A/P: We discussed blood pressure goal of <140/90. I advised her to follow up with Dr. Sarajane Jews in next few weeks after doing some home monitoring. Asked her to take meds as soon as she gets home (looks like hydralazine and hctz) but she mentions maxzide possibly. She may have to see Dr. Sarajane Jews this week if sinuses not improved as symptoms concerning to me for sinusitis as well

## 2018-07-31 ENCOUNTER — Ambulatory Visit: Payer: 59 | Admitting: Family Medicine

## 2018-08-03 ENCOUNTER — Ambulatory Visit: Payer: 59 | Admitting: Family Medicine

## 2018-08-10 ENCOUNTER — Encounter: Payer: Self-pay | Admitting: Family Medicine

## 2018-08-10 ENCOUNTER — Ambulatory Visit (INDEPENDENT_AMBULATORY_CARE_PROVIDER_SITE_OTHER): Payer: 59 | Admitting: Family Medicine

## 2018-08-10 VITALS — BP 148/90 | HR 92 | Temp 98.1°F | Wt 290.0 lb

## 2018-08-10 DIAGNOSIS — G8929 Other chronic pain: Secondary | ICD-10-CM

## 2018-08-10 DIAGNOSIS — M5440 Lumbago with sciatica, unspecified side: Secondary | ICD-10-CM

## 2018-08-10 DIAGNOSIS — F1190 Opioid use, unspecified, uncomplicated: Secondary | ICD-10-CM | POA: Diagnosis not present

## 2018-08-10 MED ORDER — FUROSEMIDE 40 MG PO TABS: ORAL_TABLET | ORAL | 3 refills | Status: DC

## 2018-08-10 MED ORDER — HYDROCHLOROTHIAZIDE 12.5 MG PO CAPS: ORAL_CAPSULE | ORAL | 3 refills | Status: DC

## 2018-08-10 MED ORDER — HYDROMORPHONE HCL 4 MG PO TABS: 4.0000 mg | ORAL_TABLET | Freq: Four times a day (QID) | ORAL | 0 refills | Status: DC | PRN

## 2018-08-10 MED ORDER — POTASSIUM CHLORIDE ER 10 MEQ PO TBCR: 10.0000 meq | EXTENDED_RELEASE_TABLET | Freq: Two times a day (BID) | ORAL | 3 refills | Status: DC

## 2018-08-10 MED ORDER — HYDROMORPHONE HCL 4 MG PO TABS: 4.0000 mg | ORAL_TABLET | Freq: Four times a day (QID) | ORAL | 0 refills | Status: AC | PRN

## 2018-08-10 NOTE — Progress Notes (Signed)
   Subjective:    Patient ID: Dana [name], female    DOB: August 21, 1966, 52 y.o.   MRN: [id number]  HPI Here for pain management. She is doing well.  Indication for chronic opioid: low back pain Medication and dose: Dilaudid 4 mg  # pills per month: 120 Last UDS date: 12-15-17 Opioid Treatment Agreement signed (Y/N): 12-15-17 Opioid Treatment Agreement last reviewed with patient:  08-10-18 [place] reviewed this encounter (include red flags):  08-10-18     Review of Systems  Constitutional: Negative.   Respiratory: Negative.   Cardiovascular: Negative.   Musculoskeletal: Positive for back pain.  Neurological: Negative.        Objective:   Physical Exam  Constitutional: She is oriented to person, place, and time. She appears well-developed and well-nourished.  Cardiovascular: Normal rate, regular rhythm, normal heart sounds and intact distal pulses.  Pulmonary/Chest: Effort normal and breath sounds normal.  Neurological: She is alert and oriented to person, place, and time.          Assessment & Plan:  Pain management, meds were refilled.  [name], MD

## 2018-08-20 ENCOUNTER — Encounter (INDEPENDENT_AMBULATORY_CARE_PROVIDER_SITE_OTHER): Payer: Self-pay | Admitting: Specialist

## 2018-08-20 ENCOUNTER — Ambulatory Visit (INDEPENDENT_AMBULATORY_CARE_PROVIDER_SITE_OTHER): Payer: 59 | Admitting: Specialist

## 2018-08-20 VITALS — BP 142/81 | HR 93 | Ht 64.0 in | Wt 292.0 lb

## 2018-08-20 DIAGNOSIS — R2241 Localized swelling, mass and lump, right lower limb: Secondary | ICD-10-CM | POA: Diagnosis not present

## 2018-08-20 DIAGNOSIS — M170 Bilateral primary osteoarthritis of knee: Secondary | ICD-10-CM | POA: Diagnosis not present

## 2018-08-20 DIAGNOSIS — M48062 Spinal stenosis, lumbar region with neurogenic claudication: Secondary | ICD-10-CM | POA: Insufficient documentation

## 2018-08-20 DIAGNOSIS — M25561 Pain in right knee: Secondary | ICD-10-CM | POA: Diagnosis not present

## 2018-08-20 DIAGNOSIS — M25562 Pain in left knee: Secondary | ICD-10-CM

## 2018-08-20 DIAGNOSIS — G8929 Other chronic pain: Secondary | ICD-10-CM

## 2018-08-20 NOTE — Progress Notes (Signed)
Office Visit Note   [name]           Date of Birth: 1966/07/29           MRN: [id number] Visit Date: 08/20/2018              Requested by: [name], MD [place], [place] [address] PCP: [name], MD   Assessment & Plan: Visit Diagnoses:  1. Chronic pain of both knees   2. Bilateral primary osteoarthritis of knee   3. Spinal stenosis of lumbar region with neurogenic claudication   4. Acute pain of right knee     Plan: Avoid bending, stooping and avoid lifting weights greater than 10 lbs. Avoid prolong standing and walking. Avoid frequent bending and stooping  No lifting greater than 10 lbs. May use ice or moist heat for pain. Weight loss is of benefit. Handicap license is approved. Dr. Romona Curls secretary/Assistant will call to arrange for lumbar facet steroid injection  Follow-Up Instructions: Return in about 3 weeks (around 09/10/2018).   Orders:  No orders of the defined types were placed in this encounter.  No orders of the defined types were placed in this encounter.     Procedures: No procedures performed   Clinical Data: No additional findings.   Subjective: Chief Complaint  Patient presents with  . Left Knee - Follow-up    S/p cortisone injection 07/09/18  . Right Knee - Follow-up    S/p Hyalgan injection 07/09/18    52 year old female with history of knee pain bilateral right knee greater than left with history of bilateral knee osteoarthritis and lumbar spinal stenosis. In the AM standing and walking is an issue. She is holding onto items to allow her to walk. She is wanting to have knee surgery but her husband is needing a lumbar. She has pain and is frustrated by the whole situation   Review of Systems  Constitutional: Negative.   HENT: Negative.   Eyes: Negative.   Respiratory: Negative.   Cardiovascular: Negative.   Gastrointestinal: Negative.   Endocrine: Negative.   Genitourinary:  Negative.   Musculoskeletal: Negative.   Skin: Negative.   Allergic/Immunologic: Negative.   Neurological: Negative.   Hematological: Negative.   Psychiatric/Behavioral: Negative.      Objective: Vital Signs: BP (!) 142/81 (BP Location: Other (Comment), Patient Position: Sitting, Cuff Size: Large) Comment (BP Location): left forearm  Pulse 93   Ht 5\' 4"  (1.626 m)   Wt 292 lb (132.5 kg)   LMP 08/17/2012   BMI 50.12 kg/m   Physical Exam  Constitutional: She is oriented to person, place, and time. She appears well-developed and well-nourished.  HENT:  Head: Normocephalic and atraumatic.  Eyes: Pupils are equal, round, and reactive to light. EOM are normal.  Neck: Normal range of motion. Neck supple.  Pulmonary/Chest: Effort normal and breath sounds normal.  Abdominal: Soft. Bowel sounds are normal.  Musculoskeletal: Normal range of motion.  Neurological: She is alert and oriented to person, place, and time.  Skin: Skin is warm and dry.  Psychiatric: She has a normal mood and affect. Her behavior is normal. Judgment and thought content normal.    Back Exam   Muscle Strength  Right Quadriceps:  5/5  Left Quadriceps:  5/5  Right Hamstrings:  5/5  Left Hamstrings:  5/5   Tests  Straight leg raise right: negative Straight leg raise left: negative  Reflexes  Patellar: 1/4 Achilles: 1/4  Other  Toe walk: abnormal Heel walk: abnormal      Specialty Comments:  No specialty comments available.  Imaging: No results found.   PMFS History: Patient Active Problem List   Diagnosis Date Noted  . Spinal stenosis of lumbar region with neurogenic claudication 08/20/2018  . 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 Primary osteoarthritis of right knee 02/27/2017  . Lateral epicondylitis of right elbow 11/11/2016  . Muscle cramps 01/15/2016  . Bilateral leg edema 01/08/2016  . Radiculopathy 12/20/2015  . Hyperglycemia 12/21/2014  . Mastodynia, female 11/30/2014    . S/P excision of fibroadenoma of breast 11/30/2014  . Angioedema of lips 04/07/2014  . Fibroadenoma of right breast 03/07/2014  . Pelvic pain in female 06/19/2012  . Menorrhagia 06/11/2012  . SUI (stress urinary incontinence, female) 06/11/2012  . HTN (hypertension) 06/11/2012  . IBS (irritable bowel syndrome) - diarrhea predominant 03/17/2012  . MICROSCOPIC HEMATURIA 11/30/2010  . BREAST PAIN, RIGHT 11/30/2010  . BREAST MASS, RIGHT 01/12/2010  . HYPERCHOLESTEROLEMIA 12/07/2007  . CIGARETTE SMOKER 12/07/2007  . DEGENERATIVE JOINT DISEASE 12/07/2007  . Low back pain with sciatica 12/07/2007  . HEADACHE 12/07/2007  . Obesity 08/03/2007  . ANXIETY 08/03/2007   Past Medical History:  Diagnosis Date  . Anxiety   . Back pain with radiation   . Breast mass, right   . Complication of anesthesia 04/07/14   Allergic reaction to Lisinopril immediately following surgery  . DJD (degenerative joint disease)   . Fibromyalgia   . Groin abscess   . Headache(784.0)   . History of IBS   . Hypercholesterolemia   . IBS (irritable bowel syndrome)   . Lactose intolerance   . Mild hypertension   . Obesity   . Tobacco use disorder   . Umbilical hernia    Symptomatic    Family History  Problem Relation Age of Onset  . Diabetes Father   . Diabetes Mother   . Prostate cancer Paternal Grandfather   . Breast cancer Maternal Aunt 46    Past Surgical History:  Procedure Laterality Date  . ABDOMINAL HYSTERECTOMY    . ANTERIOR CERVICAL DECOMP/DISCECTOMY FUSION N/A 12/20/2015   Procedure: ANTERIOR CERVICAL DECOMPRESSION FUSION CERVICAL 4-5, CERVICAL 5-6, CERVICAL 6-7 WITH INSTRUMENTATION AND ALLOGRAFT;  Surgeon: Phylliss Bob, MD;  Location: [facility];  Service: Orthopedics;  Laterality: N/A;  Anterior cervical decompression fusion, cervical 4-5, cervical 5-6, cervical 6-7 with instrumentation and allograft  . BACK SURGERY    . BILATERAL SALPINGECTOMY  09/03/2012   Procedure: BILATERAL SALPINGECTOMY;   Surgeon: [name], MD;  Location: [facility];  Service: Gynecology;  Laterality: Bilateral;  . BREAST BIOPSY Right 04/07/2014   Procedure: REMOVAL RIGHT BREAST MASS WITH WIRE LOCALIZATION;  Surgeon: [name], MD;  Location: [facility];  Service: General;  Laterality: Right;  . BREAST EXCISIONAL BIOPSY Left    x2  . BREAST LUMPECTOMY     x2  . COLONOSCOPY W/ BIOPSIES  04/24/2012   per Dr. Carlean Purl, clear, repeat in 10 yrs   . disectomy    . ESOPHAGOGASTRODUODENOSCOPY    . FINGER SURGERY     Right index-excision of mass   . FOOT SURGERY Right    Bone Spurs  . LAPAROSCOPIC HYSTERECTOMY  09/03/2012   Procedure: HYSTERECTOMY TOTAL LAPAROSCOPIC;  Surgeon: [name], MD;  Location: [facility];  Service: Gynecology;  Laterality: N/A;  . LUMBAR [place]    . TUBAL LIGATION    . UMBILICAL HERNIA REPAIR N/A  05/01/2018   Procedure: UMBILICAL HERNIA REPAIR;  Surgeon: Judeth Horn, MD;  Location: [facility];  Service: General;  Laterality: N/A;   Social History   Occupational History  . Occupation: Optometrist  Tobacco Use  . Smoking status: Former Smoker    Years: 33.00    Types: Cigarettes  . Smokeless tobacco: Never Used  . Tobacco comment: quit March 2017  Substance and Sexual Activity  . Alcohol use: Yes    Alcohol/week: 0.0 standard drinks    Comment: occ  . Drug use: No    Types: Amphetamines  . Sexual activity: Yes    Birth control/protection: Surgical    Comment: tubal ligation

## 2018-08-20 NOTE — Patient Instructions (Addendum)
Avoid bending, stooping and avoid lifting weights greater than 10 lbs. Avoid prolong standing and walking. Avoid frequent bending and stooping  No lifting greater than 10 lbs. May use ice or moist heat for pain. Weight loss is of benefit. Handicap license is approved. Dr. Romona Curls secretary/Assistant will call to arrange for lumbar facet steroid injection

## 2018-09-03 ENCOUNTER — Ambulatory Visit
Admission: RE | Admit: 2018-09-03 | Discharge: 2018-09-03 | Disposition: A | Discharge status: Home / Self Care | Payer: 59 | Source: Ambulatory Visit | Attending: Specialist | Admitting: Specialist

## 2018-09-03 DIAGNOSIS — M25561 Pain in right knee: Secondary | ICD-10-CM | POA: Diagnosis not present

## 2018-09-21 ENCOUNTER — Encounter

## 2018-09-21 ENCOUNTER — Ambulatory Visit (INDEPENDENT_AMBULATORY_CARE_PROVIDER_SITE_OTHER): Payer: Self-pay

## 2018-09-21 ENCOUNTER — Ambulatory Visit (INDEPENDENT_AMBULATORY_CARE_PROVIDER_SITE_OTHER): Payer: 59 | Admitting: Physical Medicine and Rehabilitation

## 2018-09-21 ENCOUNTER — Telehealth (INDEPENDENT_AMBULATORY_CARE_PROVIDER_SITE_OTHER): Payer: Self-pay | Admitting: *Deleted

## 2018-09-21 ENCOUNTER — Encounter (INDEPENDENT_AMBULATORY_CARE_PROVIDER_SITE_OTHER): Payer: Self-pay | Admitting: Physical Medicine and Rehabilitation

## 2018-09-21 VITALS — BP 153/91 | HR 86 | Temp 98.3°F

## 2018-09-21 DIAGNOSIS — G8929 Other chronic pain: Secondary | ICD-10-CM

## 2018-09-21 DIAGNOSIS — M47816 Spondylosis without myelopathy or radiculopathy, lumbar region: Secondary | ICD-10-CM

## 2018-09-21 DIAGNOSIS — M545 Low back pain, unspecified: Secondary | ICD-10-CM

## 2018-09-21 MED ORDER — METHYLPREDNISOLONE ACETATE 80 MG/ML IJ SUSP: 80.0000 mg | Freq: Once | INTRAMUSCULAR | Status: DC

## 2018-09-21 NOTE — Telephone Encounter (Signed)
Notification/Prior Authorization not required if procedure code [id number] performed in Office.

## 2018-09-21 NOTE — Progress Notes (Signed)
.  Numeric Pain Rating Scale and Functional Assessment Average Pain 10   In the last MONTH (on 0-10 scale) has pain interfered with the following?  1. General activity like being  able to carry out your everyday physical activities such as walking, climbing stairs, carrying groceries, or moving a chair?  Rating(5)   +Driver, -BT, -Dye Allergies.  

## 2018-09-21 NOTE — Patient Instructions (Signed)

## 2018-09-22 NOTE — Progress Notes (Addendum)
Dana [name] - 53 y.o. female MRN [id number]  Date of birth: 1966/08/11  Office Visit Note: Visit Date: 09/21/2018 PCP: [name], MD Referred by: [name], MD  Subjective: Chief Complaint  Patient presents with  . Lower Back - Pain  . Right Leg - Pain   HPI:  Dana [name] is a 53 y.o. female who comes in today At the request of Dr. Basil Dess for repeat facet joint block at L4-5 bilaterally.  She has multilevel facet arthropathy with some level of epidural lipomatosis and pretty severe central stenosis at L4-5.  Prior epidural injection in the upper lumbar region was not very successful year ago.  Facet joint block is provided some relief.  Dr. Louanne Skye did want to repeat the injection and she did say it did help at that time.  This was done in March 2019.  She rates her pain is 10 out of 10.  She does have some level of underlying anxiety issue and I question if there is some underlying fibromyalgia.  Patient is morbidly obese and is considering surgical intervention for the stenosis and arthritic problems of her back.  If she does not get much relief with facet joint block would asked Dr. Louanne Skye to look at potentially L4 transforaminal injection.  This would be diagnostic as well.  She is getting some radicular pain on the right.  ROS Otherwise per HPI.  Assessment & Plan: Visit Diagnoses:  1. Spondylosis without myelopathy or radiculopathy, lumbar region   2. Chronic bilateral low back pain without sciatica     Plan: No additional findings.   Meds & Orders:  Meds ordered this encounter  Medications  . methylPREDNISolone acetate (DEPO-MEDROL) injection 80 mg    Orders Placed This Encounter  Procedures  . Facet Injection  . XR C-ARM NO REPORT    Follow-up: Return if symptoms worsen or fail to improve, for Basil Dess, MD.   Procedures: No procedures performed  Lumbar Facet Joint Intra-Articular Injection(s) with Fluoroscopic Guidance  [name]      Date of Birth: December 04, 1965 MRN: [id number] PCP: [name], MD      Visit Date: 09/21/2018   Universal Protocol:    Date/Time: 09/21/2018  Consent Given By: the patient  Position: PRONE   Additional Comments: Vital signs were monitored before and after the procedure. Patient was prepped and draped in the usual sterile fashion. The correct patient, procedure, and site was verified.   Injection Procedure Details:  Procedure Site One Meds Administered:  Meds ordered this encounter  Medications  . methylPREDNISolone acetate (DEPO-MEDROL) injection 80 mg     Laterality: Bilateral  Location/Site:  L4-L5  Needle size: 22 guage  Needle type: Spinal  Needle Placement: Articular  Findings:  -Comments: Excellent flow of contrast producing a partial arthrogram.  Procedure Details: The fluoroscope beam is vertically oriented in AP, and the inferior recess is visualized beneath the lower pole of the inferior apophyseal process, which represents the target point for needle insertion. When direct visualization is difficult the target point is located at the medial projection of the vertebral pedicle. The region overlying each aforementioned target is locally anesthetized with a 1 to 2 ml. volume of 1% Lidocaine without Epinephrine.   The spinal needle was inserted into each of the above mentioned facet joints using biplanar fluoroscopic guidance. A 0.25 to 0.5 ml. volume of Isovue-250 was injected and a partial facet joint arthrogram was obtained. A single  spot film was obtained of the resulting arthrogram.    One to 1.25 ml of the steroid/anesthetic solution was then injected into each of the facet joints noted above.   Additional Comments:  The patient tolerated the procedure well Dressing: Band-Aid    Post-procedure details: Patient was observed during the procedure. Post-procedure instructions were reviewed.  Patient left the clinic in stable condition.     Clinical History: MRI LUMBAR SPINE WITHOUT CONTRAST  TECHNIQUE: Multiplanar, multisequence MR imaging of the lumbar spine was performed. No intravenous contrast was administered.  COMPARISON:  CT Abdomen and Pelvis 02/26/2018. Lumbar MRI 06/13/2017.  FINDINGS: Segmentation: Normal as seen on the comparison CT, which is the same numbering system used on the 2018 MRI.  Alignment: Stable vertebral height and alignment with mild straightening of lumbar lordosis. No spondylolisthesis.  Vertebrae: Chronic degenerative endplate marrow signal changes at L2-L3, sclerotic on the recent CT. Background bone marrow signal is within normal limits. No marrow edema or evidence of acute osseous abnormality. Intact visible sacrum and SI joints.  Conus medullaris and cauda equina: Conus extends to the L1 level. No lower spinal cord or conus signal abnormality.  Paraspinal and other soft tissues: Large body habitus. Stable visible abdominal viscera. Negative visualized posterior paraspinal soft tissues.  Disc levels:  T10-T11: Disc space loss with circumferential disc bulge. Mild posterior element hypertrophy. Mild spinal stenosis and left T10 foraminal stenosis. Possible mild associated spinal cord mass effect, but no definite cord signal abnormality.  T11-T12: Broad-based posterior disc bulge or protrusion. Borderline to mild spinal stenosis.  T12-L1: Progressed broad-based posterior disc bulging since 2018. Mild facet hypertrophy and epidural lipomatosis. New mild spinal stenosis. No mass effect on the conus.  L1-L2: Progressed epidural lipomatosis. Chronic circumferential disc bulging which may also be mildly increased. Stable mild facet hypertrophy. Increased spinal stenosis, now mild to moderate. Mild left L1 foraminal stenosis is stable.  L2-L3: Chronic severe disc space loss. Chronic circumferential disc osteophyte complex. Interval stable epidural lipomatosis  and mild to moderate facet hypertrophy. Stable mild to moderate spinal stenosis. Stable mild right L2 foraminal stenosis.  L3-L4: Chronic circumferential but mostly far lateral disc bulging and moderate facet and ligament flavum hypertrophy. Stable epidural lipomatosis and mild to moderate spinal stenosis. Stable mild right L3 foraminal stenosis.  L4-L5: Chronic broad-based posterior disc bulge or protrusion and moderate to severe facet hypertrophy is stable. Chronic degenerative facet joint fluid. Chronic epidural lipomatosis. Stable severe spinal stenosis (series 5, image 23). No significant foraminal stenosis.  L5-S1: Chronic broad-based posterior disc bulge or protrusion appears stable along with moderate bilateral facet hypertrophy. Stable epidural lipomatosis and mild spinal stenosis.  IMPRESSION: 1. Progressed spinal stenosis since 2018 at T12-L1 and L1-L2, mild to moderate, and related to increased disc bulging and epidural lipomatosis. 2. Stable severe multifactorial spinal stenosis at L4-L5, and moderate spinal stenosis at L2-L3 and L3-L4. 3. Mild lower thoracic spinal stenosis with up to mild spinal cord mass effect at T10-T11, but no lower spinal cord or conus signal abnormality. 4.  No acute osseous abnormality.   Electronically Signed   By: Genevie Ann M.D.   On: 04/16/2018 15:43     Objective:  VS:  HT:    WT:   BMI:     BP:(!) 153/91  HR:86bpm  TEMP:98.3 F (36.8 C)(Oral)  RESP:  Physical Exam  Ortho Exam Imaging: No results found.

## 2018-09-22 NOTE — Procedures (Signed)
Lumbar Facet Joint Intra-Articular Injection(s) with Fluoroscopic Guidance  [name]      Date of Birth: [birth year] MRN: [id number] PCP: [name], MD      Visit Date: 09/21/2018   Universal Protocol:    Date/Time: 09/21/2018  Consent Given By: the patient  Position: PRONE   Additional Comments: Vital signs were monitored before and after the procedure. Patient was prepped and draped in the usual sterile fashion. The correct patient, procedure, and site was verified.   Injection Procedure Details:  Procedure Site One Meds Administered:  Meds ordered this encounter  Medications  . methylPREDNISolone acetate (DEPO-MEDROL) injection 80 mg     Laterality: Bilateral  Location/Site:  L4-L5  Needle size: 22 guage  Needle type: Spinal  Needle Placement: Articular  Findings:  -Comments: Excellent flow of contrast producing a partial arthrogram.  Procedure Details: The fluoroscope beam is vertically oriented in AP, and the inferior recess is visualized beneath the lower pole of the inferior apophyseal process, which represents the target point for needle insertion. When direct visualization is difficult the target point is located at the medial projection of the vertebral pedicle. The region overlying each aforementioned target is locally anesthetized with a 1 to 2 ml. volume of 1% Lidocaine without Epinephrine.   The spinal needle was inserted into each of the above mentioned facet joints using biplanar fluoroscopic guidance. A 0.25 to 0.5 ml. volume of Isovue-250 was injected and a partial facet joint arthrogram was obtained. A single spot film was obtained of the resulting arthrogram.    One to 1.25 ml of the steroid/anesthetic solution was then injected into each of the facet joints noted above.   Additional Comments:  The patient tolerated the procedure well Dressing: Band-Aid    Post-procedure details: Patient was observed during the  procedure. Post-procedure instructions were reviewed.  Patient left the clinic in stable condition.

## 2018-09-23 ENCOUNTER — Ambulatory Visit (INDEPENDENT_AMBULATORY_CARE_PROVIDER_SITE_OTHER): Payer: 59 | Admitting: Specialist

## 2018-09-30 IMAGING — MR MR LUMBAR SPINE W/O CM
4 of 5 series · 24 of 48 positions shown · non-contrast
Comparison: CT Abdomen and Pelvis 02/26/2018. Lumbar MRI
06/13/2017.

CLINICAL DATA: 51-year-old female with right side lumbar back pain
radiating to the right leg with numbness. Prior surgery in 0887.

EXAM:
MRI LUMBAR SPINE WITHOUT CONTRAST
TECHNIQUE: Multiplanar, multisequence MR imaging of the lumbar spine was
performed. No intravenous contrast was administered.

[Series 2: T2 · sagittal · 4.0mm · 0.55mm/px · 6 of 13 slices shown (1 of 2)]
[im 1/13]
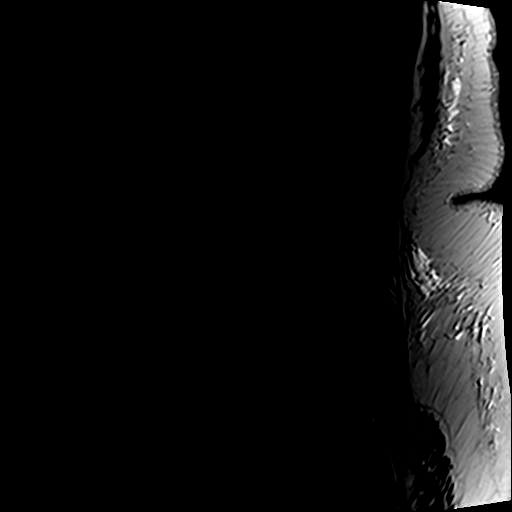
[im 3/13]
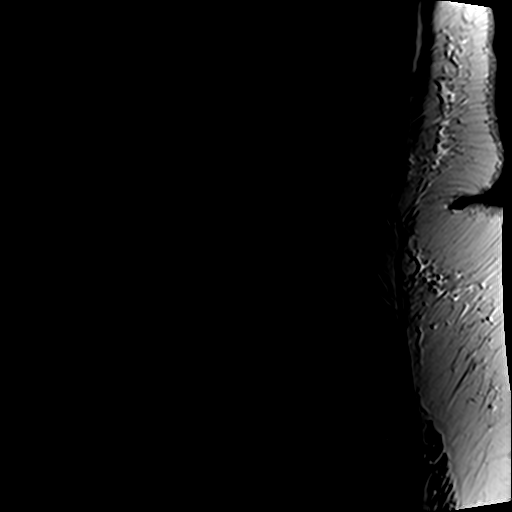
[im 5/13]
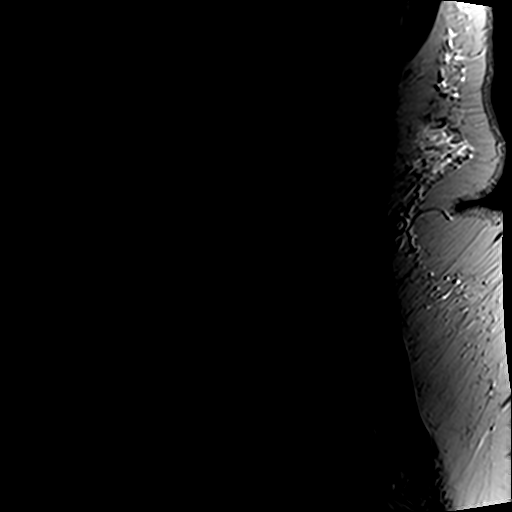
[im 8/13]
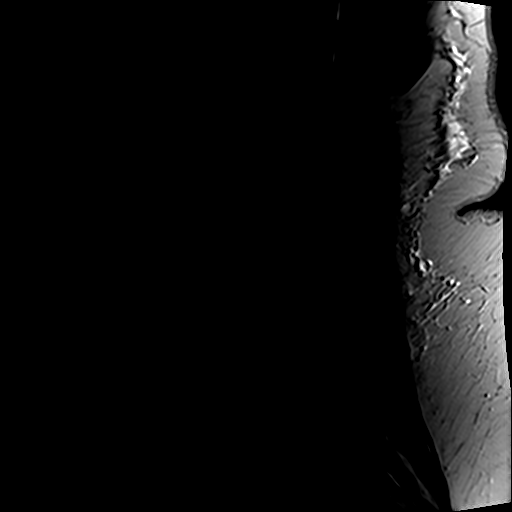
[im 10/13]
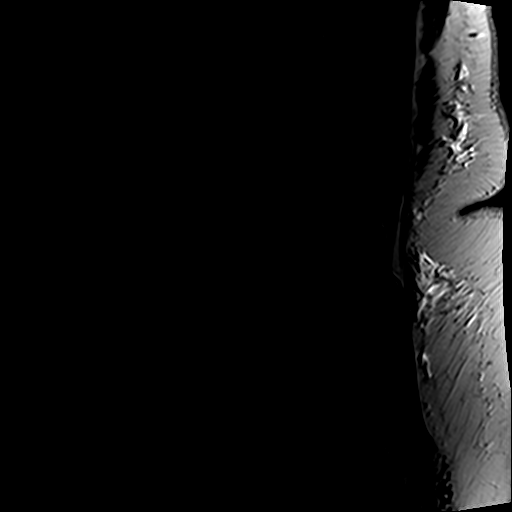
[im 13/13]
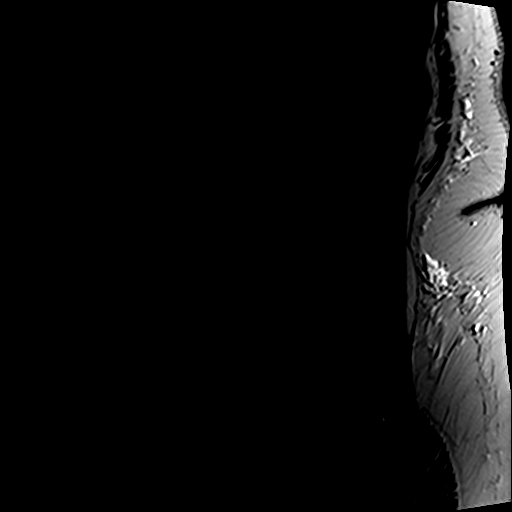

[Series 4: T1 · sagittal · 4.0mm · 0.55mm/px · 6 of 13 slices shown (1 of 2)]
[im 1/13]
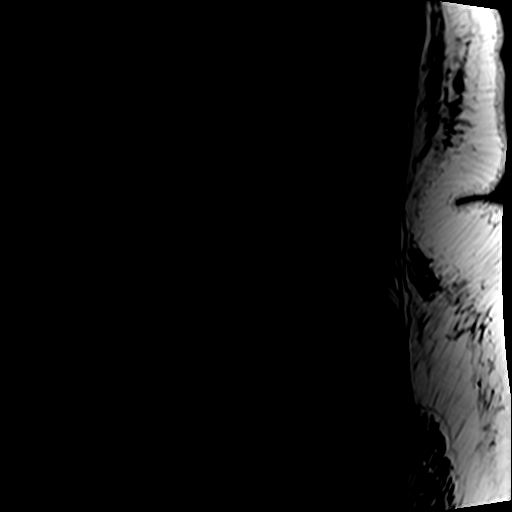
[im 3/13]
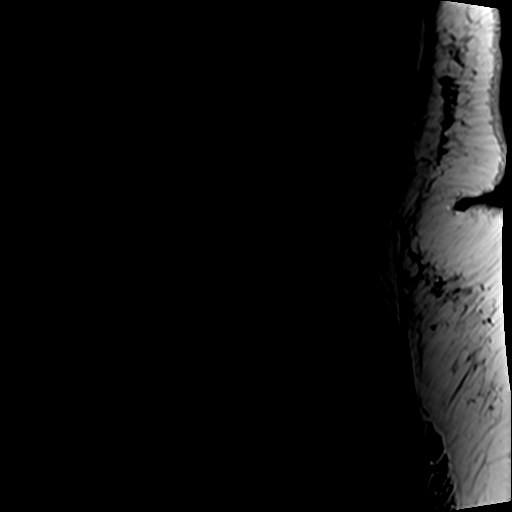
[im 5/13]
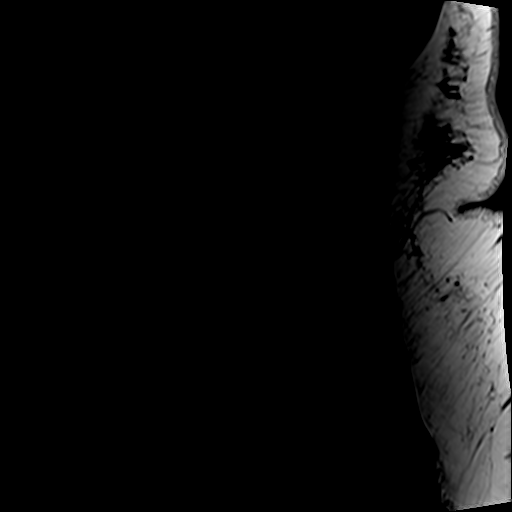
[im 8/13]
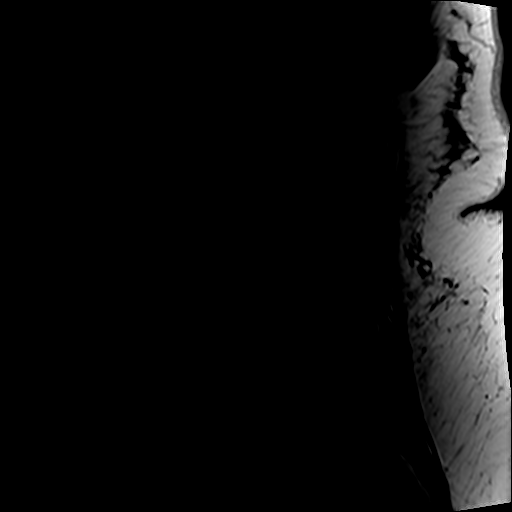
[im 10/13]
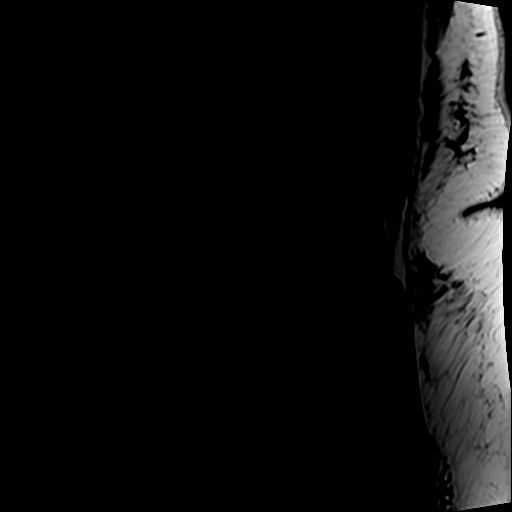
[im 13/13]
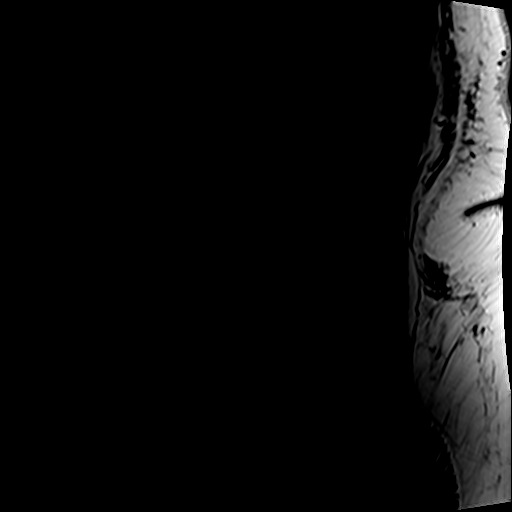

[Series 5: T2 · axial · 4.0mm · 0.70mm/px · z∈[-49,+128]mm · 9 of 32 slices shown (2 of 2)]
[im 1/32]
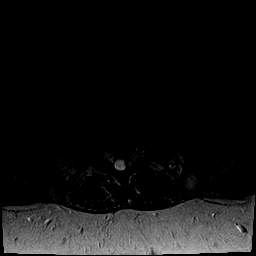
[im 5/32]
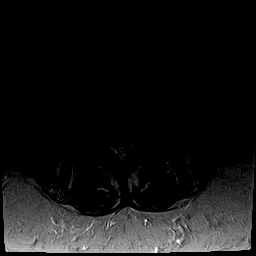
[im 9/32]
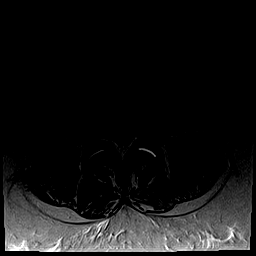
[im 14/32]
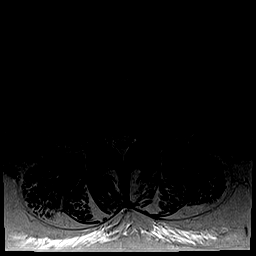
[im 16/32]
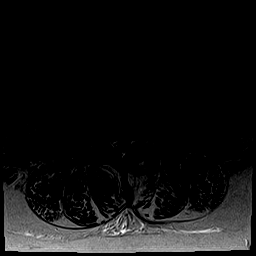
[im 18/32]
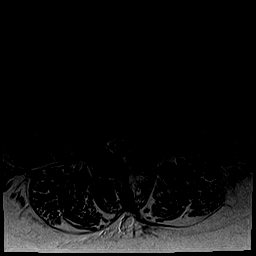
[im 23/32]
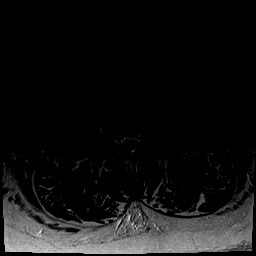
[im 27/32]
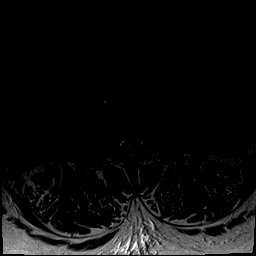
[im 32/32]
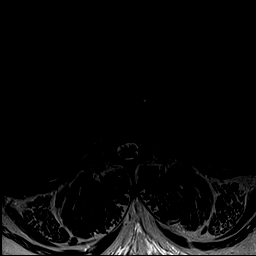

[Series 6: T1 · axial · 4.0mm · 0.35mm/px · z∈[-29,+102]mm · 3 of 32 slices shown (2 of 2)]
[im 5/32]
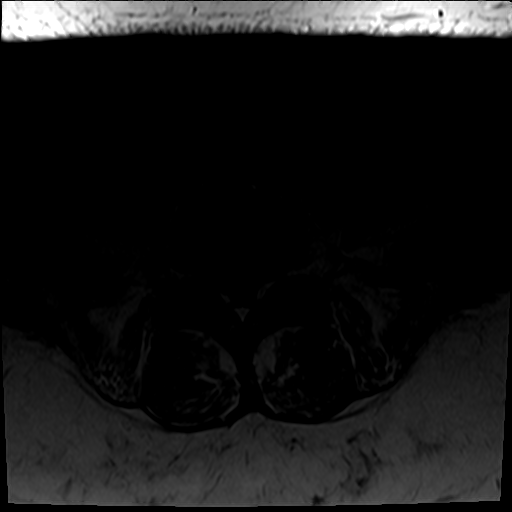
[im 16/32]
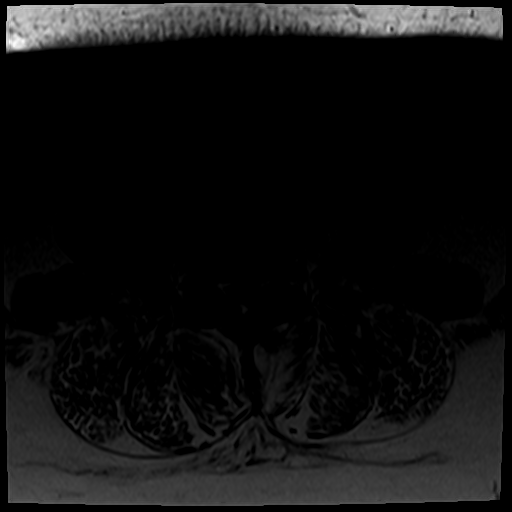
[im 27/32]
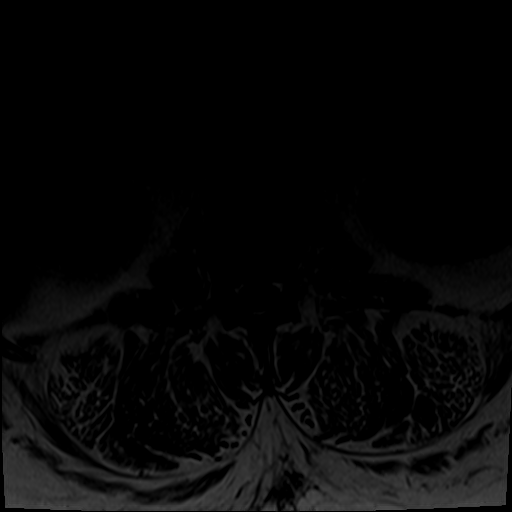

[24 of 48 positions shown; findings below may reference images not displayed]

FINDINGS: Segmentation: Normal as seen on the comparison CT, which is the same
numbering system used on the 4604 MRI.

Alignment: Stable vertebral height and alignment with mild
straightening of lumbar lordosis. No spondylolisthesis.

Vertebrae: Chronic degenerative endplate marrow signal changes at
L2-L3, sclerotic on the recent CT. Background bone marrow signal is
within normal limits. No marrow edema or evidence of acute osseous
abnormality. Intact visible sacrum and SI joints.

Conus medullaris and cauda equina: Conus extends to the L1 level. No
lower spinal cord or conus signal abnormality.

Paraspinal and other soft tissues: Large body habitus. Stable
visible abdominal viscera. Negative visualized posterior paraspinal
soft tissues.

Disc levels:

T10-T11: Disc space loss with circumferential disc bulge. Mild
posterior element hypertrophy. Mild spinal stenosis and left T10
foraminal stenosis. Possible mild associated spinal cord mass
effect, but no definite cord signal abnormality.

T11-T12: Broad-based posterior disc bulge or protrusion. Borderline
to mild spinal stenosis.

T12-L1: Progressed broad-based posterior disc bulging since 4604.
Mild facet hypertrophy and epidural lipomatosis. New mild spinal
stenosis. No mass effect on the conus.

L1-L2: Progressed epidural lipomatosis. Chronic circumferential disc
bulging which may also be mildly increased. Stable mild facet
hypertrophy. Increased spinal stenosis, now mild to moderate. Mild
left L1 foraminal stenosis is stable.

L2-L3: Chronic severe disc space loss. Chronic circumferential disc
osteophyte complex. Interval stable epidural lipomatosis and mild to
moderate facet hypertrophy. Stable mild to moderate spinal stenosis.
Stable mild right L2 foraminal stenosis.

L3-L4: Chronic circumferential but mostly far lateral disc bulging
and moderate facet and ligament flavum hypertrophy. Stable epidural
lipomatosis and mild to moderate spinal stenosis. Stable mild right
L3 foraminal stenosis.

L4-L5: Chronic broad-based posterior disc bulge or protrusion and
moderate to severe facet hypertrophy is stable. Chronic degenerative
facet joint fluid. Chronic epidural lipomatosis. Stable severe
spinal stenosis (series 5, image 23). No significant foraminal
stenosis.

L5-S1: Chronic broad-based posterior disc bulge or protrusion
appears stable along with moderate bilateral facet hypertrophy.
Stable epidural lipomatosis and mild spinal stenosis.
IMPRESSION: 1. Progressed spinal stenosis since 4604 at T12-L1 and L1-L2, mild
to moderate, and related to increased disc bulging and epidural
lipomatosis.
2. Stable severe multifactorial spinal stenosis at L4-L5, and
moderate spinal stenosis at L2-L3 and L3-L4.
3. Mild lower thoracic spinal stenosis with up to mild spinal cord
mass effect at T10-T11, but no lower spinal cord or conus signal
abnormality.
4.  No acute osseous abnormality.

## 2018-10-08 ENCOUNTER — Ambulatory Visit (INDEPENDENT_AMBULATORY_CARE_PROVIDER_SITE_OTHER): Payer: 59 | Admitting: Family Medicine

## 2018-10-08 ENCOUNTER — Encounter: Payer: Self-pay | Admitting: Family Medicine

## 2018-10-08 VITALS — BP 140/84 | HR 116 | Temp 98.1°F | Wt 287.4 lb

## 2018-10-08 DIAGNOSIS — J019 Acute sinusitis, unspecified: Secondary | ICD-10-CM | POA: Diagnosis not present

## 2018-10-08 MED ORDER — LEVOFLOXACIN 500 MG PO TABS: 500.0000 mg | ORAL_TABLET | Freq: Every day | ORAL | 0 refills | Status: AC

## 2018-10-08 NOTE — Progress Notes (Signed)
   Subjective:    Patient ID: Dana [name], female    DOB: [birth year], 53 y.o.   MRN: [id number]  HPI Here for 6 days of sinus pressure, right ear pain, PND, and a dry cough. No fever. Taking Mucinex.    Review of Systems  Constitutional: Negative.   HENT: Positive for congestion, ear pain, postnasal drip, sinus pressure and sinus pain. Negative for drooling and sore throat.   Eyes: Negative.   Respiratory: Positive for cough.        Objective:   Physical Exam Constitutional:      Appearance: Normal appearance.  HENT:     Right Ear: Tympanic membrane and ear canal normal.     Left Ear: Tympanic membrane and ear canal normal.     Nose: Nose normal.     Mouth/Throat:     Pharynx: Oropharynx is clear.  Eyes:     Conjunctiva/sclera: Conjunctivae normal.  Pulmonary:     Effort: Pulmonary effort is normal.     Breath sounds: Normal breath sounds.  Lymphadenopathy:     Cervical: No cervical adenopathy.  Neurological:     Mental Status: She is alert.           Assessment & Plan:  Sinusitis, treat with Levaquin. [name], MD

## 2018-10-15 ENCOUNTER — Ambulatory Visit (INDEPENDENT_AMBULATORY_CARE_PROVIDER_SITE_OTHER): Payer: 59 | Admitting: Specialist

## 2018-10-15 ENCOUNTER — Encounter (INDEPENDENT_AMBULATORY_CARE_PROVIDER_SITE_OTHER): Payer: Self-pay | Admitting: Specialist

## 2018-10-15 VITALS — BP 145/93 | HR 89 | Temp 97.2°F | Ht 64.0 in | Wt 288.0 lb

## 2018-10-15 DIAGNOSIS — M542 Cervicalgia: Secondary | ICD-10-CM

## 2018-10-15 DIAGNOSIS — Z6841 Body Mass Index (BMI) 40.0 and over, adult: Secondary | ICD-10-CM | POA: Diagnosis not present

## 2018-10-15 DIAGNOSIS — M1711 Unilateral primary osteoarthritis, right knee: Secondary | ICD-10-CM | POA: Diagnosis not present

## 2018-10-15 DIAGNOSIS — M25512 Pain in left shoulder: Secondary | ICD-10-CM | POA: Diagnosis not present

## 2018-10-15 MED ORDER — NAPROXEN 500 MG PO TABS: 500.0000 mg | ORAL_TABLET | Freq: Two times a day (BID) | ORAL | 4 refills | Status: DC

## 2018-10-15 NOTE — Progress Notes (Signed)
Office Visit Note   [name]           Date of Birth: [birth year]           MRN: [id number] Visit Date: 10/15/2018              Requested by: [name], MD [place], [place] [id number] PCP: [name], MD   Assessment & Plan: Visit Diagnoses:  1. Acute pain of left shoulder   2. Unilateral primary osteoarthritis, right knee    Right knee post synvisc without relief. She has done exercise on her own. The right knee 10-90  Obese.  Avoid overhead lifting and overhead use of the arms. Do not lift greater than 5 lbs. Adjust head rest in vehicle to prevent hyperextension if rear ended. Take extra precautions to avoid falling.  Knee is suffering from osteoarthritis, only real proven treatments are Weight loss, NSIADs like naprosyn and exercise. Well padded shoes help. Ice the knee 2-3 times a day 15-20 mins at a time. I recommend a knee replacement but prior to replacement BMI will need to be decreased to improve your overall chances of less complications.  Naprosyn for pain. Use of a cane in the right hand for the right knee   Follow-Up Instructions: No follow-ups on file.   Orders:  No orders of the defined types were placed in this encounter.  No orders of the defined types were placed in this encounter.     Procedures: No procedures performed   Clinical Data: No additional findings.   Subjective: Chief Complaint  Patient presents with  . Right Knee - Follow-up    MRI Review    53 year old female with history of right knee pain and also is complaining of interscapular pain along the left medial scapula. She has difficulty weight bearing on the right knee, AM has to sit for a moment then limp to the bathroom due to right pain. The right knee is getting puffy and she has intermittant swelling. The right posterior knee is painful with increased pain with bending and squatting and bending too fast increases the pain. There is  stiffness with sitting and she is unable to walk a distance due to right knee pain. Uncomfortable sitting with the right knee in one position.    Review of Systems  Constitutional: Positive for chills, diaphoresis, fatigue and fever.  HENT: Positive for congestion, sore throat and trouble swallowing.   Respiratory: Positive for cough, shortness of breath and wheezing.   Cardiovascular: Positive for leg swelling. Negative for chest pain and palpitations.  Gastrointestinal: Positive for abdominal distention and abdominal pain. Negative for anal bleeding, blood in stool, constipation, diarrhea, nausea, rectal pain and vomiting.  Genitourinary: Positive for frequency. Negative for difficulty urinating, dyspareunia, dysuria, enuresis, flank pain and hematuria.  Musculoskeletal: Negative for arthralgias, neck pain and neck stiffness.     Objective: Vital Signs: BP (!) 145/93 (BP Location: Left Arm, Patient Position: Sitting, Cuff Size: Large)   Pulse 89   Temp (!) 97.2 F (36.2 C) (Oral)   Ht 5\' 4"  (1.626 m)   Wt 288 lb (130.6 kg)   LMP 08/17/2012   BMI 49.44 kg/m   Physical Exam Constitutional:      Appearance: She is well-developed.  HENT:     Head: Normocephalic and atraumatic.  Eyes:     Pupils: Pupils are equal, round, and reactive to light.  Neck:     Musculoskeletal:  Normal range of motion and neck supple.  Pulmonary:     Effort: Pulmonary effort is normal.     Breath sounds: Normal breath sounds.  Abdominal:     General: Bowel sounds are normal.     Palpations: Abdomen is soft.  Skin:    General: Skin is warm and dry.  Neurological:     Mental Status: She is alert and oriented to person, place, and time.  Psychiatric:        Behavior: Behavior normal.        Thought Content: Thought content normal.        Judgment: Judgment normal.     Back Exam   Tenderness  The patient is experiencing tenderness in the cervical.  Range of Motion  Extension:  10 abnormal    Flexion:  50 abnormal  Lateral bend right:  50 abnormal  Lateral bend left:  50 abnormal  Rotation right:  40 abnormal  Rotation left:  50 abnormal   Muscle Strength  Right Quadriceps:  5/5  Left Quadriceps:  5/5  Right Hamstrings:  5/5  Left Hamstrings:  5/5   Tests  Straight leg raise right: negative Straight leg raise left: negative  Reflexes  Patellar: normal Achilles: normal Biceps: normal  Other  Toe walk: normal Heel walk: normal Sensation: normal Gait: normal  Erythema: no back redness Scars: absent  Comments:  Tender left medial scapula and left SSA Left shoulder with      Specialty Comments:  No specialty comments available.  Imaging: No results found.   PMFS History: Patient Active Problem List   Diagnosis Date Noted  . Spinal stenosis of lumbar region with neurogenic claudication 08/20/2018  . 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 Primary osteoarthritis of right knee 02/27/2017  . Lateral epicondylitis of right elbow 11/11/2016  . Muscle cramps 01/15/2016  . Bilateral leg edema 01/08/2016  . Radiculopathy 12/20/2015  . Hyperglycemia 12/21/2014  . Mastodynia, female 11/30/2014  . S/P excision of fibroadenoma of breast 11/30/2014  . Angioedema of lips 04/07/2014  . Fibroadenoma of right breast 03/07/2014  . Pelvic pain in female 06/19/2012  . Menorrhagia 06/11/2012  . SUI (stress urinary incontinence, female) 06/11/2012  . HTN (hypertension) 06/11/2012  . IBS (irritable bowel syndrome) - diarrhea predominant 03/17/2012  . MICROSCOPIC HEMATURIA 11/30/2010  . BREAST PAIN, RIGHT 11/30/2010  . BREAST MASS, RIGHT 01/12/2010  . HYPERCHOLESTEROLEMIA 12/07/2007  . CIGARETTE SMOKER 12/07/2007  . DEGENERATIVE JOINT DISEASE 12/07/2007  . Low back pain with sciatica 12/07/2007  . HEADACHE 12/07/2007  . Obesity 08/03/2007  . ANXIETY 08/03/2007   Past Medical History:  Diagnosis Date  . Anxiety   . Back pain with radiation   . Breast  mass, right   . Complication of anesthesia 04/07/14   Allergic reaction to Lisinopril immediately following surgery  . DJD (degenerative joint disease)   . Fibromyalgia   . Groin abscess   . Headache(784.0)   . History of IBS   . Hypercholesterolemia   . IBS (irritable bowel syndrome)   . Lactose intolerance   . Mild hypertension   . Obesity   . Tobacco use disorder   . Umbilical hernia    Symptomatic    Family History  Problem Relation Age of Onset  . Diabetes Father   . Diabetes Mother   . Prostate cancer Paternal Grandfather   . Breast cancer Maternal Aunt 46    Past Surgical History:  Procedure Laterality Date  . ABDOMINAL HYSTERECTOMY    .  ANTERIOR CERVICAL DECOMP/DISCECTOMY FUSION N/A 12/20/2015   Procedure: ANTERIOR CERVICAL DECOMPRESSION FUSION CERVICAL 4-5, CERVICAL 5-6, CERVICAL 6-7 WITH INSTRUMENTATION AND ALLOGRAFT;  Surgeon: Phylliss Bob, MD;  Location: [facility];  Service: Orthopedics;  Laterality: N/A;  Anterior cervical decompression fusion, cervical 4-5, cervical 5-6, cervical 6-7 with instrumentation and allograft  . BACK SURGERY    . BILATERAL SALPINGECTOMY  09/03/2012   Procedure: BILATERAL SALPINGECTOMY;  Surgeon: [name], MD;  Location: [facility];  Service: Gynecology;  Laterality: Bilateral;  . BREAST BIOPSY Right 04/07/2014   Procedure: REMOVAL RIGHT BREAST MASS WITH WIRE LOCALIZATION;  Surgeon: [name], MD;  Location: [facility];  Service: General;  Laterality: Right;  . BREAST EXCISIONAL BIOPSY Left    x2  . BREAST LUMPECTOMY     x2  . COLONOSCOPY W/ BIOPSIES  04/24/2012   per Dr. Carlean Purl, clear, repeat in 10 yrs   . disectomy    . ESOPHAGOGASTRODUODENOSCOPY    . FINGER SURGERY     Right index-excision of mass   . FOOT SURGERY Right    Bone Spurs  . LAPAROSCOPIC HYSTERECTOMY  09/03/2012   Procedure: HYSTERECTOMY TOTAL LAPAROSCOPIC;  Surgeon: [name], MD;  Location: [facility];  Service: Gynecology;  Laterality: N/A;  . LUMBAR [place]    . TUBAL LIGATION    . UMBILICAL HERNIA REPAIR N/A 05/01/2018   Procedure: UMBILICAL HERNIA REPAIR;  Surgeon: Judeth Horn, MD;  Location: [facility];  Service: General;  Laterality: N/A;   Social History   Occupational History  . Occupation: Optometrist  Tobacco Use  . Smoking status: Former Smoker    Years: 33.00    Types: Cigarettes  . Smokeless tobacco: Never Used  . Tobacco comment: quit March 2017  Substance and Sexual Activity  . Alcohol use: Yes    Alcohol/week: 0.0 standard drinks    Comment: occ  . Drug use: No    Types: Amphetamines  . Sexual activity: Yes    Birth control/protection: Surgical    Comment: tubal ligation

## 2018-10-15 NOTE — Patient Instructions (Signed)
Avoid overhead lifting and overhead use of the arms. Do not lift greater than 5 lbs. Adjust head rest in vehicle to prevent hyperextension if rear ended. Take extra precautions to avoid falling.  Knee is suffering from osteoarthritis, only real proven treatments are Weight loss, NSIADs like naprosyn and exercise. Well padded shoes help. Ice the knee 2-3 times a day 15-20 mins at a time. I recommend a knee replacement but prior to replacement BMI will need to be decreased to improve your overall chances of less complications.  Naprosyn for pain. Use of a cane in the right hand for the right knee

## 2018-10-28 ENCOUNTER — Ambulatory Visit (INDEPENDENT_AMBULATORY_CARE_PROVIDER_SITE_OTHER): Payer: 59 | Admitting: Physical Medicine and Rehabilitation

## 2018-10-28 ENCOUNTER — Encounter (INDEPENDENT_AMBULATORY_CARE_PROVIDER_SITE_OTHER): Payer: Self-pay | Admitting: Physical Medicine and Rehabilitation

## 2018-10-28 DIAGNOSIS — R202 Paresthesia of skin: Secondary | ICD-10-CM | POA: Diagnosis not present

## 2018-10-28 NOTE — Progress Notes (Signed)
.  Numeric Pain Rating Scale and Functional Assessment Average Pain 9   In the last MONTH (on 0-10 scale) has pain interfered with the following?  1. General activity like being  able to carry out your everyday physical activities such as walking, climbing stairs, carrying groceries, or moving a chair?  Rating(8)

## 2018-10-29 NOTE — Progress Notes (Signed)
Dana [name] - 53 y.o. female MRN [id number]  Date of birth: 10-13-1965  Office Visit Note: Visit Date: 10/28/2018 PCP: [name], MD Referred by: [name], MD  Subjective: Chief Complaint  Patient presents with  . Left Shoulder - Pain  . Neck - Pain  . Left Arm - Pain  . Left Hand - Pain   HPI: Dana [name] is a 53 y.o. female who comes in today At the request of Dr. Basil Dess for left upper extremity electrodiagnostic study.  Patient has a prior history of C4-C7 ACDF in 2017.  She is complaining of 3 months of severe ongoing posterior neck pain with referral into the left shoulder and laterally into the forearm and all of the fingers.  Pain is somewhat nondermatomal.  She reports worsening with driving and sitting up straight.  She is reported no relief with any think at this point in terms of pain medications or activity modification.  She rates her pain as a 9 out of 10.  She denies right-sided complaints.  She has not had prior electrodiagnostic study.  Her history is also complicated by history of fibromyalgia.  She has not had recent MRI of her cervical spine.  ROS Otherwise per HPI.  Assessment & Plan: Visit Diagnoses:  1. Paresthesia of skin     Plan: Impression: The above electrodiagnostic study is ABNORMAL and reveals evidence of a moderate left median nerve entrapment at the wrist (carpal tunnel syndrome) affecting sensory and motor components.   There is no significant electrodiagnostic evidence of any other focal nerve entrapment, brachial plexopathy or cervical radiculopathy.   Recommendations: 1.  Follow-up with referring physician. 2.  Continue current management of symptoms. 3.  Continue use of resting splint at night-time and as needed during the day. 4.  Suggest surgical evaluation.   Meds & Orders: No orders of the defined types were placed in this encounter.   Orders Placed This Encounter  Procedures  . NCV with EMG (electromyography)     Follow-up: Return for Basil Dess, M.D..   Procedures: No procedures performed  EMG & NCV Findings: Evaluation of the left median motor nerve showed prolonged distal onset latency (5.3 ms) and decreased conduction velocity (Elbow-Wrist, 41 m/s).  The left median (across palm) sensory nerve showed prolonged distal peak latency (Wrist, 5.0 ms), reduced amplitude (5.8 V), and prolonged distal peak latency (Palm, 3.3 ms).  All remaining nerves (as indicated in the following tables) were within normal limits.    Needle evaluation of the left first dorsal interosseous muscle showed increased insertional activity.  The left extensor digitorum communis muscle showed increased spontaneous activity.  The left triceps muscle showed increased motor unit amplitude and diminished recruitment.  All remaining muscles (as indicated in the following table) showed no evidence of electrical instability.    Impression: The above electrodiagnostic study is ABNORMAL and reveals evidence of a moderate left median nerve entrapment at the wrist (carpal tunnel syndrome) affecting sensory and motor components.   There is no significant electrodiagnostic evidence of any other focal nerve entrapment, brachial plexopathy or cervical radiculopathy.   Recommendations: 1.  Follow-up with referring physician. 2.  Continue current management of symptoms. 3.  Continue use of resting splint at night-time and as needed during the day. 4.  Suggest surgical evaluation.  ___________________________ Wonda Olds Board Certified, American Board of Physical Medicine and Rehabilitation    Nerve Conduction Studies Anti Sensory Summary Table   Stim Site  NR Peak (ms) Norm Peak (ms) P-T Amp (V) Norm P-T Amp Site1 Site2 Delta-P (ms) Dist (cm) Vel (m/s) Norm Vel (m/s)  Left Median Acr Palm Anti Sensory (2nd Digit)  32.3C  Wrist    *5.0 <3.6 *5.8 >10 Wrist Palm 1.7 0.0    Palm    *3.3 <2.0 7.2         Left Radial Anti  Sensory (Base 1st Digit)  32.8C  Wrist    2.0 <3.1 24.4  Wrist Base 1st Digit 2.0 0.0    Left Ulnar Anti Sensory (5th Digit)  33.2C  Wrist    3.4 <3.7 23.1 >15.0 Wrist 5th Digit 3.4 14.0 41 >38   Motor Summary Table   Stim Site NR Onset (ms) Norm Onset (ms) O-P Amp (mV) Norm O-P Amp Site1 Site2 Delta-0 (ms) Dist (cm) Vel (m/s) Norm Vel (m/s)  Left Median Motor (Abd Poll Brev)  32.7C  Wrist    *5.3 <4.2 7.4 >5 Elbow Wrist 5.2 21.5 *41 >50  Elbow    10.5  7.0         Left Ulnar Motor (Abd Dig Min)  32.6C  Wrist    3.4 <4.2 6.9 >3 B Elbow Wrist 3.6 21.0 58 >53  B Elbow    7.0  4.5  A Elbow B Elbow 1.4 10.0 71 >53  A Elbow    8.4  5.7          EMG   Side Muscle Nerve Root Ins Act Fibs Psw Amp Dur Poly Recrt Int Fraser Din Comment  Left 1stDorInt Ulnar C8-T1 *Incr Nml Nml Nml Nml 0 Nml Nml   Left Abd Poll Brev Median C8-T1 Nml Nml Nml Nml Nml 0 Nml Nml   Left ExtDigCom   Nml *3+ *3+ Nml Nml 0 Nml Nml   Left Triceps Radial C6-7-8 Nml Nml Nml *Incr Nml 0 *Reduced Nml   Left Deltoid Axillary C5-6 Nml Nml Nml Nml Nml 0 Nml Nml     Nerve Conduction Studies Anti Sensory Left/Right Comparison   Stim Site L Lat (ms) R Lat (ms) L-R Lat (ms) L Amp (V) R Amp (V) L-R Amp (%) Site1 Site2 L Vel (m/s) R Vel (m/s) L-R Vel (m/s)  Median Acr Palm Anti Sensory (2nd Digit)  32.3C  Wrist *5.0   *5.8   Wrist Palm     Palm *3.3   7.2         Radial Anti Sensory (Base 1st Digit)  32.8C  Wrist 2.0   24.4   Wrist Base 1st Digit     Ulnar Anti Sensory (5th Digit)  33.2C  Wrist 3.4   23.1   Wrist 5th Digit 41     Motor Left/Right Comparison   Stim Site L Lat (ms) R Lat (ms) L-R Lat (ms) L Amp (mV) R Amp (mV) L-R Amp (%) Site1 Site2 L Vel (m/s) R Vel (m/s) L-R Vel (m/s)  Median Motor (Abd Poll Brev)  32.7C  Wrist *5.3   7.4   Elbow Wrist *41    Elbow 10.5   7.0         Ulnar Motor (Abd Dig Min)  32.6C  Wrist 3.4   6.9   B Elbow Wrist 58    B Elbow 7.0   4.5   A Elbow B Elbow 71    A Elbow 8.4   5.7             Waveforms:  Clinical History: MRI LUMBAR SPINE WITHOUT CONTRAST  TECHNIQUE: Multiplanar, multisequence MR imaging of the lumbar spine was performed. No intravenous contrast was administered.  COMPARISON:  CT Abdomen and Pelvis 02/26/2018. Lumbar MRI 06/13/2017.  FINDINGS: Segmentation: Normal as seen on the comparison CT, which is the same numbering system used on the 2018 MRI.  Alignment: Stable vertebral height and alignment with mild straightening of lumbar lordosis. No spondylolisthesis.  Vertebrae: Chronic degenerative endplate marrow signal changes at L2-L3, sclerotic on the recent CT. Background bone marrow signal is within normal limits. No marrow edema or evidence of acute osseous abnormality. Intact visible sacrum and SI joints.  Conus medullaris and cauda equina: Conus extends to the L1 level. No lower spinal cord or conus signal abnormality.  Paraspinal and other soft tissues: Large body habitus. Stable visible abdominal viscera. Negative visualized posterior paraspinal soft tissues.  Disc levels:  T10-T11: Disc space loss with circumferential disc bulge. Mild posterior element hypertrophy. Mild spinal stenosis and left T10 foraminal stenosis. Possible mild associated spinal cord mass effect, but no definite cord signal abnormality.  T11-T12: Broad-based posterior disc bulge or protrusion. Borderline to mild spinal stenosis.  T12-L1: Progressed broad-based posterior disc bulging since 2018. Mild facet hypertrophy and epidural lipomatosis. New mild spinal stenosis. No mass effect on the conus.  L1-L2: Progressed epidural lipomatosis. Chronic circumferential disc bulging which may also be mildly increased. Stable mild facet hypertrophy. Increased spinal stenosis, now mild to moderate. Mild left L1 foraminal stenosis is stable.  L2-L3: Chronic severe disc space loss. Chronic circumferential disc osteophyte complex.  Interval stable epidural lipomatosis and mild to moderate facet hypertrophy. Stable mild to moderate spinal stenosis. Stable mild right L2 foraminal stenosis.  L3-L4: Chronic circumferential but mostly far lateral disc bulging and moderate facet and ligament flavum hypertrophy. Stable epidural lipomatosis and mild to moderate spinal stenosis. Stable mild right L3 foraminal stenosis.  L4-L5: Chronic broad-based posterior disc bulge or protrusion and moderate to severe facet hypertrophy is stable. Chronic degenerative facet joint fluid. Chronic epidural lipomatosis. Stable severe spinal stenosis (series 5, image 23). No significant foraminal stenosis.  L5-S1: Chronic broad-based posterior disc bulge or protrusion appears stable along with moderate bilateral facet hypertrophy. Stable epidural lipomatosis and mild spinal stenosis.  IMPRESSION: 1. Progressed spinal stenosis since 2018 at T12-L1 and L1-L2, mild to moderate, and related to increased disc bulging and epidural lipomatosis. 2. Stable severe multifactorial spinal stenosis at L4-L5, and moderate spinal stenosis at L2-L3 and L3-L4. 3. Mild lower thoracic spinal stenosis with up to mild spinal cord mass effect at T10-T11, but no lower spinal cord or conus signal abnormality. 4.  No acute osseous abnormality.   Electronically Signed   By: Genevie Ann M.D.   On: 04/16/2018 15:43   She reports that she has quit smoking. Her smoking use included cigarettes. She quit after 33.00 years of use. She has never used smokeless tobacco. No results for input(s): HGBA1C, LABURIC in the last 8760 hours.  Objective:  VS:  HT:    WT:   BMI:     BP:   HR: bpm  TEMP: ( )  RESP:  Physical Exam Musculoskeletal:        General: No swelling, tenderness or deformity.     Comments: Inspection reveals no atrophy of the bilateral APB or FDI or hand intrinsics.  There is increased pain with any sort of movement of the arm and shoulder  mechanically.  There is no swelling, color changes, allodynia or dystrophic changes.  There is 5 out of 5 strength in the bilateral wrist extension, finger abduction and long finger flexion.  There is dysesthesia to light touch in a somewhat nondermatomal fashion but could be more of a C7 distribution versus median nerve distribution..  There is a negative Phalen's test bilaterally. There is a negative Hoffmann's test bilaterally.  Skin:    General: Skin is warm and dry.     Findings: No erythema or rash.  Neurological:     General: No focal deficit present.     Mental Status: She is alert and oriented to person, place, and time.     Motor: No weakness or abnormal muscle tone.     Coordination: Coordination normal.     Gait: Gait normal.  Psychiatric:        Mood and Affect: Mood normal.        Behavior: Behavior normal.     Ortho Exam Imaging: No results found.  Past Medical/Family/Surgical/Social History: Medications & Allergies reviewed per EMR, new medications updated. Patient Active Problem List   Diagnosis Date Noted  . Spinal stenosis of lumbar region with neurogenic claudication 08/20/2018  . 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 Primary osteoarthritis of right knee 02/27/2017  . Lateral epicondylitis of right elbow 11/11/2016  . Muscle cramps 01/15/2016  . Bilateral leg edema 01/08/2016  . Radiculopathy 12/20/2015  . Hyperglycemia 12/21/2014  . Mastodynia, female 11/30/2014  . S/P excision of fibroadenoma of breast 11/30/2014  . Angioedema of lips 04/07/2014  . Fibroadenoma of right breast 03/07/2014  . Pelvic pain in female 06/19/2012  . Menorrhagia 06/11/2012  . SUI (stress urinary incontinence, female) 06/11/2012  . HTN (hypertension) 06/11/2012  . IBS (irritable bowel syndrome) - diarrhea predominant 03/17/2012  . MICROSCOPIC HEMATURIA 11/30/2010  . BREAST PAIN, RIGHT 11/30/2010  . BREAST MASS, RIGHT 01/12/2010  . HYPERCHOLESTEROLEMIA 12/07/2007  .  CIGARETTE SMOKER 12/07/2007  . DEGENERATIVE JOINT DISEASE 12/07/2007  . Low back pain with sciatica 12/07/2007  . HEADACHE 12/07/2007  . Obesity 08/03/2007  . ANXIETY 08/03/2007   Past Medical History:  Diagnosis Date  . Anxiety   . Back pain with radiation   . Breast mass, right   . Complication of anesthesia 04/07/14   Allergic reaction to Lisinopril immediately following surgery  . DJD (degenerative joint disease)   . Fibromyalgia   . Groin abscess   . Headache(784.0)   . History of IBS   . Hypercholesterolemia   . IBS (irritable bowel syndrome)   . Lactose intolerance   . Mild hypertension   . Obesity   . Tobacco use disorder   . Umbilical hernia    Symptomatic   Family History  Problem Relation Age of Onset  . Diabetes Father   . Diabetes Mother   . Prostate cancer Paternal Grandfather   . Breast cancer Maternal Aunt 46   Past Surgical History:  Procedure Laterality Date  . ABDOMINAL HYSTERECTOMY    . ANTERIOR CERVICAL DECOMP/DISCECTOMY FUSION N/A 12/20/2015   Procedure: ANTERIOR CERVICAL DECOMPRESSION FUSION CERVICAL 4-5, CERVICAL 5-6, CERVICAL 6-7 WITH INSTRUMENTATION AND ALLOGRAFT;  Surgeon: Phylliss Bob, MD;  Location: [facility];  Service: Orthopedics;  Laterality: N/A;  Anterior cervical decompression fusion, cervical 4-5, cervical 5-6, cervical 6-7 with instrumentation and allograft  . BACK SURGERY    . BILATERAL SALPINGECTOMY  09/03/2012   Procedure: BILATERAL SALPINGECTOMY;  Surgeon: [name], MD;  Location: [facility];  Service: Gynecology;  Laterality: Bilateral;  . BREAST BIOPSY Right 04/07/2014  Procedure: REMOVAL RIGHT BREAST MASS WITH WIRE LOCALIZATION;  Surgeon: [name], MD;  Location: [facility];  Service: General;  Laterality: Right;  . BREAST EXCISIONAL BIOPSY Left    x2  . BREAST LUMPECTOMY     x2  . COLONOSCOPY W/ BIOPSIES  04/24/2012   per Dr. Carlean Purl, clear, repeat in 10 yrs   . disectomy    . ESOPHAGOGASTRODUODENOSCOPY    . FINGER  SURGERY     Right index-excision of mass   . FOOT SURGERY Right    Bone Spurs  . LAPAROSCOPIC HYSTERECTOMY  09/03/2012   Procedure: HYSTERECTOMY TOTAL LAPAROSCOPIC;  Surgeon: [name], MD;  Location: [facility];  Service: Gynecology;  Laterality: N/A;  . LUMBAR [place]    . TUBAL LIGATION    . UMBILICAL HERNIA REPAIR N/A 05/01/2018   Procedure: UMBILICAL HERNIA REPAIR;  Surgeon: Judeth Horn, MD;  Location: [facility];  Service: General;  Laterality: N/A;   Social History   Occupational History  . Occupation: Optometrist  Tobacco Use  . Smoking status: Former Smoker    Years: 33.00    Types: Cigarettes  . Smokeless tobacco: Never Used  . Tobacco comment: quit March 2017  Substance and Sexual Activity  . Alcohol use: Yes    Alcohol/week: 0.0 standard drinks    Comment: occ  . Drug use: No    Types: Amphetamines  . Sexual activity: Yes    Birth control/protection: Surgical    Comment: tubal ligation

## 2018-11-09 NOTE — Procedures (Signed)
EMG & NCV Findings: Evaluation of the left median motor nerve showed prolonged distal onset latency (5.3 ms) and decreased conduction velocity (Elbow-Wrist, 41 m/s).  The left median (across palm) sensory nerve showed prolonged distal peak latency (Wrist, 5.0 ms), reduced amplitude (5.8 V), and prolonged distal peak latency (Palm, 3.3 ms).  All remaining nerves (as indicated in the following tables) were within normal limits.    Needle evaluation of the left first dorsal interosseous muscle showed increased insertional activity.  The left extensor digitorum communis muscle showed increased spontaneous activity.  The left triceps muscle showed increased motor unit amplitude and diminished recruitment.  All remaining muscles (as indicated in the following table) showed no evidence of electrical instability.    Impression: The above electrodiagnostic study is ABNORMAL and reveals evidence of a moderate left median nerve entrapment at the wrist (carpal tunnel syndrome) affecting sensory and motor components.   There is no significant electrodiagnostic evidence of any other focal nerve entrapment, brachial plexopathy or cervical radiculopathy.   Recommendations: 1.  Follow-up with referring physician. 2.  Continue current management of symptoms. 3.  Continue use of resting splint at night-time and as needed during the day. 4.  Suggest surgical evaluation.  ___________________________ Laurence Spates FAAPMR Board Certified, American Board of Physical Medicine and Rehabilitation    Nerve Conduction Studies Anti Sensory Summary Table   Stim Site NR Peak (ms) Norm Peak (ms) P-T Amp (V) Norm P-T Amp Site1 Site2 Delta-P (ms) Dist (cm) Vel (m/s) Norm Vel (m/s)  Left Median Acr Palm Anti Sensory (2nd Digit)  32.3C  Wrist    *5.0 <3.6 *5.8 >10 Wrist Palm 1.7 0.0    Palm    *3.3 <2.0 7.2         Left Radial Anti Sensory (Base 1st Digit)  32.8C  Wrist    2.0 <3.1 24.4  Wrist Base 1st Digit 2.0 0.0      Left Ulnar Anti Sensory (5th Digit)  33.2C  Wrist    3.4 <3.7 23.1 >15.0 Wrist 5th Digit 3.4 14.0 41 >38   Motor Summary Table   Stim Site NR Onset (ms) Norm Onset (ms) O-P Amp (mV) Norm O-P Amp Site1 Site2 Delta-0 (ms) Dist (cm) Vel (m/s) Norm Vel (m/s)  Left Median Motor (Abd Poll Brev)  32.7C  Wrist    *5.3 <4.2 7.4 >5 Elbow Wrist 5.2 21.5 *41 >50  Elbow    10.5  7.0         Left Ulnar Motor (Abd Dig Min)  32.6C  Wrist    3.4 <4.2 6.9 >3 B Elbow Wrist 3.6 21.0 58 >53  B Elbow    7.0  4.5  A Elbow B Elbow 1.4 10.0 71 >53  A Elbow    8.4  5.7          EMG   Side Muscle Nerve Root Ins Act Fibs Psw Amp Dur Poly Recrt Int Fraser Din Comment  Left 1stDorInt Ulnar C8-T1 *Incr Nml Nml Nml Nml 0 Nml Nml   Left Abd Poll Brev Median C8-T1 Nml Nml Nml Nml Nml 0 Nml Nml   Left ExtDigCom   Nml *3+ *3+ Nml Nml 0 Nml Nml   Left Triceps Radial C6-7-8 Nml Nml Nml *Incr Nml 0 *Reduced Nml   Left Deltoid Axillary C5-6 Nml Nml Nml Nml Nml 0 Nml Nml     Nerve Conduction Studies Anti Sensory Left/Right Comparison   Stim Site L Lat (ms) R Lat (ms) L-R Lat (ms)  L Amp (V) R Amp (V) L-R Amp (%) Site1 Site2 L Vel (m/s) R Vel (m/s) L-R Vel (m/s)  Median Acr Palm Anti Sensory (2nd Digit)  32.3C  Wrist *5.0   *5.8   Wrist Palm     Palm *3.3   7.2         Radial Anti Sensory (Base 1st Digit)  32.8C  Wrist 2.0   24.4   Wrist Base 1st Digit     Ulnar Anti Sensory (5th Digit)  33.2C  Wrist 3.4   23.1   Wrist 5th Digit 41     Motor Left/Right Comparison   Stim Site L Lat (ms) R Lat (ms) L-R Lat (ms) L Amp (mV) R Amp (mV) L-R Amp (%) Site1 Site2 L Vel (m/s) R Vel (m/s) L-R Vel (m/s)  Median Motor (Abd Poll Brev)  32.7C  Wrist *5.3   7.4   Elbow Wrist *41    Elbow 10.5   7.0         Ulnar Motor (Abd Dig Min)  32.6C  Wrist 3.4   6.9   B Elbow Wrist 58    B Elbow 7.0   4.5   A Elbow B Elbow 71    A Elbow 8.4   5.7            Waveforms:

## 2018-11-16 ENCOUNTER — Ambulatory Visit (INDEPENDENT_AMBULATORY_CARE_PROVIDER_SITE_OTHER): Payer: 59 | Admitting: Specialist

## 2018-11-16 ENCOUNTER — Encounter (INDEPENDENT_AMBULATORY_CARE_PROVIDER_SITE_OTHER): Payer: Self-pay | Admitting: Specialist

## 2018-11-16 VITALS — BP 130/74 | HR 103 | Ht 64.0 in | Wt 288.0 lb

## 2018-11-16 DIAGNOSIS — M1711 Unilateral primary osteoarthritis, right knee: Secondary | ICD-10-CM | POA: Diagnosis not present

## 2018-11-16 DIAGNOSIS — G5602 Carpal tunnel syndrome, left upper limb: Secondary | ICD-10-CM

## 2018-11-16 MED ORDER — CYCLOBENZAPRINE HCL 10 MG PO TABS: ORAL_TABLET | ORAL | 3 refills | Status: DC

## 2018-11-16 MED ORDER — BUPIVACAINE HCL 0.25 % IJ SOLN
4.0000 mL | INTRAMUSCULAR | Status: AC | PRN
  Administered 2018-11-16: 4 mL via INTRA_ARTICULAR

## 2018-11-16 MED ORDER — METHYLPREDNISOLONE ACETATE 40 MG/ML IJ SUSP
40.0000 mg | INTRAMUSCULAR | Status: AC | PRN
  Administered 2018-11-16: 40 mg via INTRA_ARTICULAR

## 2018-11-16 NOTE — Progress Notes (Signed)
Office Visit Note   [name]           Date of Birth: [birth year]           MRN: [id number] Visit Date: 11/16/2018              Requested by: [name], MD [place], [place] [id number] PCP: [name], MD   Assessment & Plan: Visit Diagnoses:  1. Unilateral primary osteoarthritis, right knee   2. Carpal tunnel syndrome, left upper limb     Plan:  Knee is suffering from osteoarthritis, only real proven treatments are Weight loss, NSIADs like diclofenac and exercise. Well padded shoes help. Ice the knee 2-3 times a day 15-20 mins at a time. Carpal Tunnel Syndrome  Carpal tunnel syndrome is a condition that causes pain in your hand and arm. The carpal tunnel is a narrow area located on the palm side of your wrist. Repeated wrist motion or certain diseases may cause swelling within the tunnel. This swelling pinches the main nerve in the wrist (median nerve). What are the causes? This condition may be caused by:  Repeated wrist motions.  Wrist injuries.  Arthritis.  A cyst or tumor in the carpal tunnel.  Fluid buildup during pregnancy. Sometimes the cause of this condition is not known. What increases the risk? This condition is more likely to develop in:  People who have jobs that cause them to repeatedly move their wrists in the same motion, such as Art gallery manager.  Women.  People with certain conditions, such as: ? Diabetes. ? Obesity. ? An underactive thyroid (hypothyroidism). ? Kidney failure. What are the signs or symptoms? Symptoms of this condition include:  A tingling feeling in your fingers, especially in your thumb, index, and middle fingers.  Tingling or numbness in your hand.  An aching feeling in your entire arm, especially when your wrist and elbow are bent for long periods of time.  Wrist pain that goes up your arm to your shoulder.  Pain that goes down into your palm or fingers.  A weak  feeling in your hands. You may have trouble grabbing and holding items. Your symptoms may feel worse during the night. How is this diagnosed? This condition is diagnosed with a medical history and physical exam. You may also have tests, including:  An electromyogram (EMG). This test measures electrical signals sent by your nerves into the muscles.  X-rays. How is this treated? Treatment for this condition includes:  Lifestyle changes. It is important to stop doing or modify the activity that caused your condition.  Physical or occupational therapy.  Medicines for pain and inflammation. This may include medicine that is injected into your wrist.  A wrist splint.  Surgery. Follow these instructions at home: If you have a splint:   Wear it as told by your health care provider. Remove it only as told by your health care provider.  Loosen the splint if your fingers become numb and tingle, or if they turn cold and blue.  Keep the splint clean and dry. General instructions   Take over-the-counter and prescription medicines only as told by your health care provider.  Rest your wrist from any activity that may be causing your pain. If your condition is work related, talk to your employer about changes that can be made, such as getting a wrist pad to use while typing.  If directed, apply ice to the painful area: ? Put ice  in a plastic bag. ? Place a towel between your skin and the bag. ? Leave the ice on for 20 minutes, 2-3 times per day.  Keep all follow-up visits as told by your health care provider. This is important.  Do any exercises as told by your health care provider, physical therapist, or occupational therapist. Contact a health care provider if:  You have new symptoms.  Your pain is not controlled with medicines.  Your symptoms get worse. This information is not intended to replace advice given to you by your health care provider. Make sure you discuss any questions  you have with your health care provider. Document Released: 08/30/2000 Document Revised: 01/11/2016 Document Reviewed: 05/14/2017 Elsevier Interactive Patient Education  2017 [place] Instructions: No follow-ups on file.   Orders:  Orders Placed This Encounter  Procedures  . Large Joint Inj   No orders of the defined types were placed in this encounter.     Procedures: Large Joint Inj: R knee on 11/16/2018 3:28 PM Indications: pain Details: 25 G 1.5 in needle, anteromedial approach  Arthrogram: No  Medications: 40 mg methylPREDNISolone acetate 40 MG/ML; 4 mL bupivacaine 0.25 % Outcome: tolerated well, no immediate complications  bandaid applied. Procedure, treatment alternatives, risks and benefits explained, specific risks discussed. Consent was given by the patient. Immediately prior to procedure a time out was called to verify the correct patient, procedure, equipment, support staff and site/side marked as required. Patient was prepped and draped in the usual sterile fashion.       Clinical Data: Findings:  Procedure Orders: 1. NCV with EMG (electromyography) ([id number]) ordered by Magnus Sinning, MD at 10/28/18 0901  Pre-procedure Diagnoses 1. Paresthesia of skin (R20.2)    EMG & NCV Findings: Evaluation of the left median motor nerve showed prolonged distal onset latency (5.3 ms) and decreased conduction velocity (Elbow-Wrist, 41 m/s).  The left median (across palm) sensory nerve showed prolonged distal peak latency (Wrist, 5.0 ms), reduced amplitude (5.8 V), and prolonged distal peak latency (Palm, 3.3 ms).  All remaining nerves (as indicated in the following tables) were within normal limits.    Needle evaluation of the left first dorsal interosseous muscle showed increased insertional activity.  The left extensor digitorum communis muscle showed increased spontaneous activity.  The left triceps muscle showed increased motor unit amplitude and  diminished recruitment.  All remaining muscles (as indicated in the following table) showed no evidence of electrical instability.    Impression: The above electrodiagnostic study is ABNORMAL and reveals evidence of a moderate left median nerve entrapment at the wrist (carpal tunnel syndrome) affecting sensory and motor components.   There is no significant electrodiagnostic evidence of any other focal nerve entrapment, brachial plexopathy or cervical radiculopathy.   Recommendations: 1.  Follow-up with referring physician. 2.  Continue current management of symptoms. 3.  Continue use of resting splint at night-time and as needed during the day. 4.  Suggest surgical evaluation.  ___________________________ Wonda Olds Board Certified, American Board of Physical Medicine and Rehabilitation   CLINICAL DATA:  Right knee pain.  No known injury.  EXAM: MRI OF THE RIGHT KNEE WITHOUT CONTRAST  TECHNIQUE: Multiplanar, multisequence MR imaging of the knee was performed. No intravenous contrast was administered.  COMPARISON:  11/07/2012  FINDINGS: MENISCI  Medial meniscus: Complex tear of body-posterior horn of the medial meniscus with a radial component and peripheral meniscal extrusion.  Lateral meniscus:  Intact.  LIGAMENTS  Cruciates:  Intact ACL and PCL.  Collaterals: Medial collateral ligament is intact. Lateral collateral ligament complex is intact.  CARTILAGE  Patellofemoral: Partial-thickness cartilage loss of the patellofemoral compartment with areas of full-thickness cartilage loss along the periphery of the medial patellar facet. Small marginal osteophytes.  Medial: High-grade partial-thickness cartilage loss with areas of full-thickness cartilage loss of the medial femorotibial compartment with subchondral reactive marrow edema at the periphery of the medial tibial plateau. Small marginal osteophytes.  Lateral: Partial-thickness  cartilage loss of the lateral femorotibial compartment with small marginal osteophytes.  Joint: Small joint effusion. Normal Hoffa's fat. No plical thickening.  Popliteal Fossa: Moderate-sized Baker's cyst. Intact popliteus tendon.  Extensor Mechanism: Intact quadriceps tendon. Intact patellar tendon. Intact medial patellar retinaculum. Intact lateral patellar retinaculum. Intact MPFL.  Bones:  No acute osseous abnormality.  No aggressive osseous lesion.  Other: No fluid collection or hematoma.  Muscles are normal.  IMPRESSION: 1. Complex tear of body-posterior horn of the medial meniscus with a radial component and peripheral meniscal extrusion. 2. Tricompartmental cartilage abnormalities as described above most severe in the medial femorotibial compartment and patellofemoral compartment consistent with tricompartmental osteoarthritis. 3. Moderate-sized Baker's cyst.   Electronically Signed   By: Kathreen Devoid     Subjective: Chief Complaint  Patient presents with  . Left Arm - Follow-up    Follow up nerve conduction study    53 year old female with right knee severe osteoarthritis of the knee post right knee Synvisc One injection 2 months ago without relief of pain and swelling she requests a cortisone injection today.  Left arm with numbness and paresthesias left hand and also so paresthesias into the left medial and inferior scapular angle. She has undergone the recent left UE EMG/NCV.   Review of Systems   Objective: Vital Signs: BP 130/74 (BP Location: Left Arm, Patient Position: Sitting)   Pulse (!) 103   Ht 5\' 4"  (1.626 m)   Wt 288 lb (130.6 kg)   LMP 08/17/2012   BMI 49.44 kg/m   Physical Exam Musculoskeletal:     Right knee: She exhibits effusion.     Right Knee Exam   Muscle Strength  The patient has normal right knee strength.  Tenderness  The patient is experiencing tenderness in the medial joint line, patella and patellar  tendon.  Range of Motion  Extension: 5  Flexion: 100   Tests  McMurray:  Medial - positive Lateral - negative Varus: positive Valgus: negative Lachman:  Anterior - negative    Posterior - negative Drawer:  Anterior - negative    Posterior - negative Pivot shift: negative Patellar apprehension: negative  Other  Erythema: absent Scars: absent Sensation: normal Swelling: mild Effusion: effusion present   Left Hand Exam   Tests  Phalen's sign: positive Tinel's sign (median nerve): positive  Other  Erythema: absent Scars: absent Sensation: decreased Pulse: present      Specialty Comments:  No specialty comments available.  Imaging: No results found.   PMFS History: Patient Active Problem List   Diagnosis Date Noted  . Spinal stenosis of lumbar region with neurogenic claudication 08/20/2018  . 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 Primary osteoarthritis of right knee 02/27/2017  . Lateral epicondylitis of right elbow 11/11/2016  . Muscle cramps 01/15/2016  . Bilateral leg edema 01/08/2016  . Radiculopathy 12/20/2015  . Hyperglycemia 12/21/2014  . Mastodynia, female 11/30/2014  . S/P excision of fibroadenoma of breast 11/30/2014  . Angioedema of lips 04/07/2014  . Fibroadenoma of right breast 03/07/2014  .  Pelvic pain in female 06/19/2012  . Menorrhagia 06/11/2012  . SUI (stress urinary incontinence, female) 06/11/2012  . HTN (hypertension) 06/11/2012  . IBS (irritable bowel syndrome) - diarrhea predominant 03/17/2012  . MICROSCOPIC HEMATURIA 11/30/2010  . BREAST PAIN, RIGHT 11/30/2010  . BREAST MASS, RIGHT 01/12/2010  . HYPERCHOLESTEROLEMIA 12/07/2007  . CIGARETTE SMOKER 12/07/2007  . DEGENERATIVE JOINT DISEASE 12/07/2007  . Low back pain with sciatica 12/07/2007  . HEADACHE 12/07/2007  . Obesity 08/03/2007  . ANXIETY 08/03/2007   Past Medical History:  Diagnosis Date  . Anxiety   . Back pain with radiation   . Breast mass, right   .  Complication of anesthesia 04/07/14   Allergic reaction to Lisinopril immediately following surgery  . DJD (degenerative joint disease)   . Fibromyalgia   . Groin abscess   . Headache(784.0)   . History of IBS   . Hypercholesterolemia   . IBS (irritable bowel syndrome)   . Lactose intolerance   . Mild hypertension   . Obesity   . Tobacco use disorder   . Umbilical hernia    Symptomatic    Family History  Problem Relation Age of Onset  . Diabetes Father   . Diabetes Mother   . Prostate cancer Paternal Grandfather   . Breast cancer Maternal Aunt 46    Past Surgical History:  Procedure Laterality Date  . ABDOMINAL HYSTERECTOMY    . ANTERIOR CERVICAL DECOMP/DISCECTOMY FUSION N/A 12/20/2015   Procedure: ANTERIOR CERVICAL DECOMPRESSION FUSION CERVICAL 4-5, CERVICAL 5-6, CERVICAL 6-7 WITH INSTRUMENTATION AND ALLOGRAFT;  Surgeon: Phylliss Bob, MD;  Location: [facility];  Service: Orthopedics;  Laterality: N/A;  Anterior cervical decompression fusion, cervical 4-5, cervical 5-6, cervical 6-7 with instrumentation and allograft  . BACK SURGERY    . BILATERAL SALPINGECTOMY  09/03/2012   Procedure: BILATERAL SALPINGECTOMY;  Surgeon: [name], MD;  Location: [facility];  Service: Gynecology;  Laterality: Bilateral;  . BREAST BIOPSY Right 04/07/2014   Procedure: REMOVAL RIGHT BREAST MASS WITH WIRE LOCALIZATION;  Surgeon: [name], MD;  Location: [facility];  Service: General;  Laterality: Right;  . BREAST EXCISIONAL BIOPSY Left    x2  . BREAST LUMPECTOMY     x2  . COLONOSCOPY W/ BIOPSIES  04/24/2012   per Dr. Carlean Purl, clear, repeat in 10 yrs   . disectomy    . ESOPHAGOGASTRODUODENOSCOPY    . FINGER SURGERY     Right index-excision of mass   . FOOT SURGERY Right    Bone Spurs  . LAPAROSCOPIC HYSTERECTOMY  09/03/2012   Procedure: HYSTERECTOMY TOTAL LAPAROSCOPIC;  Surgeon: [name], MD;  Location: [facility];  Service: Gynecology;  Laterality: N/A;  . LUMBAR [place]    . TUBAL  LIGATION    . UMBILICAL HERNIA REPAIR N/A 05/01/2018   Procedure: UMBILICAL HERNIA REPAIR;  Surgeon: Judeth Horn, MD;  Location: [facility];  Service: General;  Laterality: N/A;   Social History   Occupational History  . Occupation: Optometrist  Tobacco Use  . Smoking status: Former Smoker    Years: 33.00    Types: Cigarettes  . Smokeless tobacco: Never Used  . Tobacco comment: quit March 2017  Substance and Sexual Activity  . Alcohol use: Yes    Alcohol/week: 0.0 standard drinks    Comment: occ  . Drug use: No    Types: Amphetamines  . Sexual activity: Yes    Birth control/protection: Surgical    Comment: tubal ligation

## 2018-11-16 NOTE — Patient Instructions (Signed)
Knee is suffering from osteoarthritis, only real proven treatments are Weight loss, NSIADs like diclofenac and exercise. Well padded shoes help. Ice the knee 2-3 times a day 15-20 mins at a time. Carpal Tunnel Syndrome  Carpal tunnel syndrome is a condition that causes pain in your hand and arm. The carpal tunnel is a narrow area located on the palm side of your wrist. Repeated wrist motion or certain diseases may cause swelling within the tunnel. This swelling pinches the main nerve in the wrist (median nerve). What are the causes? This condition may be caused by:  Repeated wrist motions.  Wrist injuries.  Arthritis.  A cyst or tumor in the carpal tunnel.  Fluid buildup during pregnancy. Sometimes the cause of this condition is not known. What increases the risk? This condition is more likely to develop in:  People who have jobs that cause them to repeatedly move their wrists in the same motion, such as Art gallery manager.  Women.  People with certain conditions, such as: ? Diabetes. ? Obesity. ? An underactive thyroid (hypothyroidism). ? Kidney failure. What are the signs or symptoms? Symptoms of this condition include:  A tingling feeling in your fingers, especially in your thumb, index, and middle fingers.  Tingling or numbness in your hand.  An aching feeling in your entire arm, especially when your wrist and elbow are bent for long periods of time.  Wrist pain that goes up your arm to your shoulder.  Pain that goes down into your palm or fingers.  A weak feeling in your hands. You may have trouble grabbing and holding items. Your symptoms may feel worse during the night. How is this diagnosed? This condition is diagnosed with a medical history and physical exam. You may also have tests, including:  An electromyogram (EMG). This test measures electrical signals sent by your nerves into the muscles.  X-rays. How is this treated? Treatment for this  condition includes:  Lifestyle changes. It is important to stop doing or modify the activity that caused your condition.  Physical or occupational therapy.  Medicines for pain and inflammation. This may include medicine that is injected into your wrist.  A wrist splint.  Surgery. Follow these instructions at home: If you have a splint:   Wear it as told by your health care provider. Remove it only as told by your health care provider.  Loosen the splint if your fingers become numb and tingle, or if they turn cold and blue.  Keep the splint clean and dry. General instructions   Take over-the-counter and prescription medicines only as told by your health care provider.  Rest your wrist from any activity that may be causing your pain. If your condition is work related, talk to your employer about changes that can be made, such as getting a wrist pad to use while typing.  If directed, apply ice to the painful area: ? Put ice in a plastic bag. ? Place a towel between your skin and the bag. ? Leave the ice on for 20 minutes, 2-3 times per day.  Keep all follow-up visits as told by your health care provider. This is important.  Do any exercises as told by your health care provider, physical therapist, or occupational therapist. Contact a health care provider if:  You have new symptoms.  Your pain is not controlled with medicines.  Your symptoms get worse. This information is not intended to replace advice given to you by your health care provider. Make sure  you discuss any questions you have with your health care provider. Document Released: 08/30/2000 Document Revised: 01/11/2016 Document Reviewed: 05/14/2017 Elsevier Interactive Patient Education  2017 [employer].

## 2018-11-19 ENCOUNTER — Ambulatory Visit: Payer: 59 | Admitting: Family Medicine

## 2018-11-20 ENCOUNTER — Ambulatory Visit (INDEPENDENT_AMBULATORY_CARE_PROVIDER_SITE_OTHER): Payer: 59 | Admitting: Family Medicine

## 2018-11-20 ENCOUNTER — Encounter: Payer: Self-pay | Admitting: Family Medicine

## 2018-11-20 VITALS — BP 138/82 | HR 94 | Temp 98.1°F | Wt 287.1 lb

## 2018-11-20 DIAGNOSIS — G8929 Other chronic pain: Secondary | ICD-10-CM | POA: Diagnosis not present

## 2018-11-20 DIAGNOSIS — F1190 Opioid use, unspecified, uncomplicated: Secondary | ICD-10-CM | POA: Diagnosis not present

## 2018-11-20 DIAGNOSIS — M5440 Lumbago with sciatica, unspecified side: Secondary | ICD-10-CM

## 2018-11-20 MED ORDER — HYDROMORPHONE HCL 4 MG PO TABS: 4.0000 mg | ORAL_TABLET | Freq: Four times a day (QID) | ORAL | 0 refills | Status: DC | PRN

## 2018-11-20 MED ORDER — HYDROMORPHONE HCL 4 MG PO TABS: 4.0000 mg | ORAL_TABLET | Freq: Four times a day (QID) | ORAL | 0 refills | Status: AC | PRN

## 2018-11-20 NOTE — Progress Notes (Signed)
   Subjective:    Patient ID: Dana [name], female    DOB: [birth year], 53 y.o.   MRN: [id number]  HPI Here for pain management. Her low back pain is stable. She was recently diagnosed by Dr. Louanne Skye with left carpal tunnel syndrome. This is the source of the pains she is having in the left arm and shoulder. She is wearing a wrist splint now.  Indication for chronic opioid: low back pain Medication and dose: Dilaudid 4 mg  # pills per month: 120 Last UDS date: 12-15-17 Opioid Treatment Agreement signed (Y/N): 12-15-17 Opioid Treatment Agreement last reviewed with patient:  11-20-18 [facility] reviewed this encounter (include red flags):  11-20-18   Review of Systems  Constitutional: Negative.   Respiratory: Negative.   Cardiovascular: Negative.   Musculoskeletal: Positive for arthralgias and back pain.       Objective:   Physical Exam Constitutional:      Appearance: Normal appearance.  Cardiovascular:     Rate and Rhythm: Normal rate and regular rhythm.     Pulses: Normal pulses.     Heart sounds: Normal heart sounds.  Pulmonary:     Effort: Pulmonary effort is normal.     Breath sounds: Normal breath sounds.  Neurological:     Mental Status: She is alert.           Assessment & Plan:  Pain management, meds are refilled.  [name], MD

## 2018-12-16 ENCOUNTER — Telehealth (INDEPENDENT_AMBULATORY_CARE_PROVIDER_SITE_OTHER): Payer: Self-pay

## 2018-12-16 NOTE — Telephone Encounter (Signed)
Called and left a VM for patient to CB to confirm appointment for 12/17/2018 and answer COVID-19 screening questions before appointment.

## 2018-12-17 ENCOUNTER — Ambulatory Visit (INDEPENDENT_AMBULATORY_CARE_PROVIDER_SITE_OTHER): Payer: 59 | Admitting: Specialist

## 2018-12-17 ENCOUNTER — Other Ambulatory Visit: Payer: Self-pay

## 2018-12-17 ENCOUNTER — Encounter (INDEPENDENT_AMBULATORY_CARE_PROVIDER_SITE_OTHER): Payer: Self-pay | Admitting: Specialist

## 2018-12-17 VITALS — BP 175/91 | HR 109 | Ht 64.0 in | Wt 282.0 lb

## 2018-12-17 DIAGNOSIS — G5602 Carpal tunnel syndrome, left upper limb: Secondary | ICD-10-CM | POA: Diagnosis not present

## 2018-12-17 DIAGNOSIS — M79642 Pain in left hand: Secondary | ICD-10-CM

## 2018-12-17 MED ORDER — METHYLPREDNISOLONE ACETATE 40 MG/ML IJ SUSP
20.0000 mg | INTRAMUSCULAR | Status: AC | PRN
  Administered 2018-12-17: 20 mg

## 2018-12-17 MED ORDER — LIDOCAINE HCL 1 % IJ SOLN
3.0000 mL | INTRAMUSCULAR | Status: AC | PRN
  Administered 2018-12-17: 11:00:00 3 mL

## 2018-12-17 NOTE — Progress Notes (Signed)
Office Visit Note   Patient: SOLARA GOODCHILD           Date of Birth: 1966-01-15           MRN: [id number] Visit Date: 12/17/2018              Requested by: [name], MD [place], [name] [address] PCP: [name], MD   Assessment & Plan: Visit Diagnoses:  1. Pain of left hand   2. Carpal tunnel syndrome, left upper limb     Plan: Carpal Tunnel Syndrome  Carpal tunnel syndrome is a condition that causes pain in your hand and arm. The carpal tunnel is a narrow area located on the palm side of your wrist. Repeated wrist motion or certain diseases may cause swelling within the tunnel. This swelling pinches the main nerve in the wrist (median nerve). What are the causes? This condition may be caused by:  Repeated wrist motions.  Wrist injuries.  Arthritis.  A cyst or tumor in the carpal tunnel.  Fluid buildup during pregnancy. Sometimes the cause of this condition is not known. What increases the risk? This condition is more likely to develop in:  People who have jobs that cause them to repeatedly move their wrists in the same motion, such as Art gallery manager.  Women.  People with certain conditions, such as: ? Diabetes. ? Obesity. ? An underactive thyroid (hypothyroidism). ? Kidney failure. What are the signs or symptoms? Symptoms of this condition include:  A tingling feeling in your fingers, especially in your thumb, index, and middle fingers.  Tingling or numbness in your hand.  An aching feeling in your entire arm, especially when your wrist and elbow are bent for long periods of time.  Wrist pain that goes up your arm to your shoulder.  Pain that goes down into your palm or fingers.  A weak feeling in your hands. You may have trouble grabbing and holding items. Your symptoms may feel worse during the night. How is this diagnosed? This condition is diagnosed with a medical history and physical exam. You may also have  tests, including:  An electromyogram (EMG). This test measures electrical signals sent by your nerves into the muscles.  X-rays. How is this treated? Treatment for this condition includes:  Lifestyle changes. It is important to stop doing or modify the activity that caused your condition.  Physical or occupational therapy.  Medicines for pain and inflammation. This may include medicine that is injected into your wrist.  A wrist splint.  Surgery. Follow these instructions at home: If you have a splint:   Wear it as told by your health care provider. Remove it only as told by your health care provider.  Loosen the splint if your fingers become numb and tingle, or if they turn cold and blue.  Keep the splint clean and dry. General instructions   Take over-the-counter and prescription medicines only as told by your health care provider.  Rest your wrist from any activity that may be causing your pain. If your condition is work related, talk to your employer about changes that can be made, such as getting a wrist pad to use while typing.  If directed, apply ice to the painful area: ? Put ice in a plastic bag. ? Place a towel between your skin and the bag. ? Leave the ice on for 20 minutes, 2-3 times per day.  Keep all follow-up visits as told by your  health care provider. This is important.  Do any exercises as told by your health care provider, physical therapist, or occupational therapist. Contact a health care provider if:  You have new symptoms.  Your pain is not controlled with medicines.  Your symptoms get worse. This information is not intended to replace advice given to you by your health care provider. Make sure you discuss any questions you have with your health care provider. Document Released: 08/30/2000 Document Revised: 01/11/2016 Document Reviewed: 05/14/2017 Elsevier Interactive Patient Education  2017 [place] to the area of the injection for  10-15 minutes at a time if needed. May bath the area of the injection this evening.  Call if there is any concern. Expect numbness for 1 1/2 hours and then it will wear off. Some patients have felt some numbness for a couple of days. Vitamin B Complex tablet once a day to provide VItamin G66, B6, B1 and folic acid for nerve healing and creel oil or fish oil daily helps with regeneration of nerves and nerve healing. Follow-Up Instructions: Return in about 6 weeks (around 01/28/2019).   Orders:  Orders Placed This Encounter  Procedures  . Hand/UE Inj   No orders of the defined types were placed in this encounter.     Procedures: Hand/UE Inj for carpal tunnel syndrome on 12/17/2018 10:47 AM Details: 25 G needle, volar approach Medications: 3 mL lidocaine 1 %; 20 mg methylPREDNISolone acetate 40 MG/ML  Bandaid applied. Patient was prepped and draped in the usual sterile fashion.       Clinical Data: No additional findings.   Subjective: Chief Complaint  Patient presents with  . Left Hand - Pain    53 year old right handed female with history of left hand numbness and tingling. No weakness. Night pain and numbness and worsening with use of the left hand driving or repetitive. She has been using a left wrist splint for one month. The splint does help some but not much maybe 20%. She has trouble with driving. Underwent recent EMG/NCV by Dr. Ernestina Patches and he did diagnose a left moderate CTS with motor and sensory findings.   Review of Systems  Constitutional: Negative.  Negative for activity change, appetite change, chills, diaphoresis, fatigue, fever and unexpected weight change.  HENT: Negative for congestion, dental problem, drooling, ear discharge, ear pain, facial swelling, hearing loss, mouth sores, nosebleeds, postnasal drip, rhinorrhea, sinus pressure, sinus pain, sneezing, sore throat, tinnitus, trouble swallowing and voice change.   Eyes: Negative for photophobia, pain,  discharge, redness, itching and visual disturbance.  Respiratory: Negative for apnea, cough, choking, chest tightness, shortness of breath, wheezing and stridor.   Cardiovascular: Negative for chest pain, palpitations and leg swelling.  Gastrointestinal: Negative for abdominal distention, abdominal pain, anal bleeding, blood in stool, constipation, diarrhea, nausea and rectal pain.  Endocrine: Negative for cold intolerance, heat intolerance, polydipsia, polyphagia and polyuria.  Genitourinary: Negative for decreased urine volume, difficulty urinating, dyspareunia, dysuria, enuresis, flank pain, frequency, genital sores, hematuria, menstrual problem, pelvic pain, urgency and vaginal bleeding.  Musculoskeletal: Negative.   Skin: Negative.   Allergic/Immunologic: Negative for environmental allergies, food allergies and immunocompromised state.  Neurological: Positive for weakness and numbness. Negative for dizziness, tremors, seizures, syncope, facial asymmetry, speech difficulty, light-headedness and headaches.  Hematological: Negative for adenopathy. Does not bruise/bleed easily.  Psychiatric/Behavioral: Negative for agitation, behavioral problems, confusion, decreased concentration, dysphoric mood, hallucinations, self-injury and sleep disturbance. The patient is not nervous/anxious and is not hyperactive.  Objective: Vital Signs: BP (!) 175/91 (BP Location: Right Arm, Patient Position: Sitting)   Pulse (!) 109   Ht 5\' 4"  (1.626 m)   Wt 282 lb (127.9 kg)   LMP 08/17/2012   BMI 48.41 kg/m   Physical Exam Constitutional:      Appearance: She is well-developed.  HENT:     Head: Normocephalic and atraumatic.  Eyes:     Pupils: Pupils are equal, round, and reactive to light.  Neck:     Musculoskeletal: Normal range of motion and neck supple.  Pulmonary:     Effort: Pulmonary effort is normal.     Breath sounds: Normal breath sounds.  Abdominal:     General: Bowel sounds are  normal.     Palpations: Abdomen is soft.  Skin:    General: Skin is warm and dry.  Neurological:     Mental Status: She is alert and oriented to person, place, and time.  Psychiatric:        Behavior: Behavior normal.        Thought Content: Thought content normal.        Judgment: Judgment normal.     Right Hand Exam   Tenderness  The patient is experiencing no tenderness.   Range of Motion  Wrist  Extension: normal  Flexion: normal  Pronation: normal  Supination: normal   Muscle Strength  Wrist extension: 5/5  Wrist flexion: 5/5  Grip: 5/5   Tests  Phalen's Sign: negative Tinel's sign (median nerve): negative  Other  Erythema: absent Scars: absent Sensation: normal Pulse: present   Left Hand Exam   Tenderness  The patient is experiencing tenderness in the palmar area.   Range of Motion  Wrist  Extension: normal  Flexion: normal  Pronation: normal  Supination: normal   Muscle Strength  Wrist extension: 5/5  Wrist flexion: 5/5  Grip:  5/5   Tests  Phalen's sign: positive Tinel's sign (median nerve): positive  Other  Erythema: absent Scars: absent Sensation: normal Pulse: present      Specialty Comments:  No specialty comments available.  Imaging: No results found.   PMFS History: Patient Active Problem List   Diagnosis Date Noted  . Spinal stenosis of lumbar region with neurogenic claudication 08/20/2018  . 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 Primary osteoarthritis of right knee 02/27/2017  . Lateral epicondylitis of right elbow 11/11/2016  . Muscle cramps 01/15/2016  . Bilateral leg edema 01/08/2016  . Radiculopathy 12/20/2015  . Hyperglycemia 12/21/2014  . Mastodynia, female 11/30/2014  . S/P excision of fibroadenoma of breast 11/30/2014  . Angioedema of lips 04/07/2014  . Fibroadenoma of right breast 03/07/2014  . Pelvic pain in female 06/19/2012  . Menorrhagia 06/11/2012  . SUI (stress urinary incontinence,  female) 06/11/2012  . HTN (hypertension) 06/11/2012  . IBS (irritable bowel syndrome) - diarrhea predominant 03/17/2012  . MICROSCOPIC HEMATURIA 11/30/2010  . BREAST PAIN, RIGHT 11/30/2010  . BREAST MASS, RIGHT 01/12/2010  . HYPERCHOLESTEROLEMIA 12/07/2007  . CIGARETTE SMOKER 12/07/2007  . DEGENERATIVE JOINT DISEASE 12/07/2007  . Low back pain with sciatica 12/07/2007  . HEADACHE 12/07/2007  . Obesity 08/03/2007  . ANXIETY 08/03/2007   Past Medical History:  Diagnosis Date  . Anxiety   . Back pain with radiation   . Breast mass, right   . Complication of anesthesia 04/07/14   Allergic reaction to Lisinopril immediately following surgery  . DJD (degenerative joint disease)   . Fibromyalgia   . Groin abscess   .  Headache(784.0)   . History of IBS   . Hypercholesterolemia   . IBS (irritable bowel syndrome)   . Lactose intolerance   . Mild hypertension   . Obesity   . Tobacco use disorder   . Umbilical hernia    Symptomatic    Family History  Problem Relation Age of Onset  . Diabetes Father   . Diabetes Mother   . Prostate cancer Paternal Grandfather   . Breast cancer Maternal Aunt 46    Past Surgical History:  Procedure Laterality Date  . ABDOMINAL HYSTERECTOMY    . ANTERIOR CERVICAL DECOMP/DISCECTOMY FUSION N/A 12/20/2015   Procedure: ANTERIOR CERVICAL DECOMPRESSION FUSION CERVICAL 4-5, CERVICAL 5-6, CERVICAL 6-7 WITH INSTRUMENTATION AND ALLOGRAFT;  Surgeon: Phylliss Bob, MD;  Location: [facility];  Service: Orthopedics;  Laterality: N/A;  Anterior cervical decompression fusion, cervical 4-5, cervical 5-6, cervical 6-7 with instrumentation and allograft  . BACK SURGERY    . BILATERAL SALPINGECTOMY  09/03/2012   Procedure: BILATERAL SALPINGECTOMY;  Surgeon: [name], MD;  Location: [facility];  Service: Gynecology;  Laterality: Bilateral;  . BREAST BIOPSY Right 04/07/2014   Procedure: REMOVAL RIGHT BREAST MASS WITH WIRE LOCALIZATION;  Surgeon: [name], MD;   Location: [facility];  Service: General;  Laterality: Right;  . BREAST EXCISIONAL BIOPSY Left    x2  . BREAST LUMPECTOMY     x2  . COLONOSCOPY W/ BIOPSIES  04/24/2012   per Dr. Carlean Purl, clear, repeat in 10 yrs   . disectomy    . ESOPHAGOGASTRODUODENOSCOPY    . FINGER SURGERY     Right index-excision of mass   . FOOT SURGERY Right    Bone Spurs  . LAPAROSCOPIC HYSTERECTOMY  09/03/2012   Procedure: HYSTERECTOMY TOTAL LAPAROSCOPIC;  Surgeon: [name], MD;  Location: [facility];  Service: Gynecology;  Laterality: N/A;  . LUMBAR [place]    . TUBAL LIGATION    . UMBILICAL HERNIA REPAIR N/A 05/01/2018   Procedure: UMBILICAL HERNIA REPAIR;  Surgeon: Judeth Horn, MD;  Location: [facility];  Service: General;  Laterality: N/A;   Social History   Occupational History  . Occupation: Optometrist  Tobacco Use  . Smoking status: Former Smoker    Years: 33.00    Types: Cigarettes  . Smokeless tobacco: Never Used  . Tobacco comment: quit March 2017  Substance and Sexual Activity  . Alcohol use: Yes    Alcohol/week: 0.0 standard drinks    Comment: occ  . Drug use: No    Types: Amphetamines  . Sexual activity: Yes    Birth control/protection: Surgical    Comment: tubal ligation

## 2018-12-17 NOTE — Patient Instructions (Addendum)
Plan: Carpal Tunnel Syndrome  Carpal tunnel syndrome is a condition that causes pain in your hand and arm. The carpal tunnel is a narrow area located on the palm side of your wrist. Repeated wrist motion or certain diseases may cause swelling within the tunnel. This swelling pinches the main nerve in the wrist (median nerve). What are the causes? This condition may be caused by:  Repeated wrist motions.  Wrist injuries.  Arthritis.  A cyst or tumor in the carpal tunnel.  Fluid buildup during pregnancy. Sometimes the cause of this condition is not known. What increases the risk? This condition is more likely to develop in:  People who have jobs that cause them to repeatedly move their wrists in the same motion, such as Art gallery manager.  Women.  People with certain conditions, such as: ? Diabetes. ? Obesity. ? An underactive thyroid (hypothyroidism). ? Kidney failure. What are the signs or symptoms? Symptoms of this condition include:  A tingling feeling in your fingers, especially in your thumb, index, and middle fingers.  Tingling or numbness in your hand.  An aching feeling in your entire arm, especially when your wrist and elbow are bent for long periods of time.  Wrist pain that goes up your arm to your shoulder.  Pain that goes down into your palm or fingers.  A weak feeling in your hands. You may have trouble grabbing and holding items. Your symptoms may feel worse during the night. How is this diagnosed? This condition is diagnosed with a medical history and physical exam. You may also have tests, including:  An electromyogram (EMG). This test measures electrical signals sent by your nerves into the muscles.  X-rays. How is this treated? Treatment for this condition includes:  Lifestyle changes. It is important to stop doing or modify the activity that caused your condition.  Physical or occupational therapy.  Medicines for pain and inflammation.  This may include medicine that is injected into your wrist.  A wrist splint.  Surgery. Follow these instructions at home: If you have a splint:   Wear it as told by your health care provider. Remove it only as told by your health care provider.  Loosen the splint if your fingers become numb and tingle, or if they turn cold and blue.  Keep the splint clean and dry. General instructions   Take over-the-counter and prescription medicines only as told by your health care provider.  Rest your wrist from any activity that may be causing your pain. If your condition is work related, talk to your employer about changes that can be made, such as getting a wrist pad to use while typing.  If directed, apply ice to the painful area: ? Put ice in a plastic bag. ? Place a towel between your skin and the bag. ? Leave the ice on for 20 minutes, 2-3 times per day.  Keep all follow-up visits as told by your health care provider. This is important.  Do any exercises as told by your health care provider, physical therapist, or occupational therapist. Contact a health care provider if:  You have new symptoms.  Your pain is not controlled with medicines.  Your symptoms get worse. This information is not intended to replace advice given to you by your health care provider. Make sure you discuss any questions you have with your health care provider. Document Released: 08/30/2000 Document Revised: 01/11/2016 Document Reviewed: 05/14/2017 Elsevier Interactive Patient Education  2017 [place] to the  area of the injection for 10-15 minutes at a time if needed. May bath the area of the injection this evening.  Call if there is any concern. Expect numbness for 1 1/2 hours and then it will wear off. Some patients have felt some numbness for a couple of days. Vitamin B Complex tablet once a day to provide VItamin H74, B6, B1 and folic acid for nerve healing and creel oil or fish oil daily helps  with regeneration of nerves and nerve healing.

## 2019-01-25 ENCOUNTER — Other Ambulatory Visit: Payer: Self-pay

## 2019-01-25 ENCOUNTER — Encounter: Payer: Self-pay | Admitting: Family Medicine

## 2019-01-25 ENCOUNTER — Other Ambulatory Visit: Payer: Self-pay | Admitting: Family Medicine

## 2019-01-25 ENCOUNTER — Ambulatory Visit (INDEPENDENT_AMBULATORY_CARE_PROVIDER_SITE_OTHER): Payer: 59 | Admitting: Family Medicine

## 2019-01-25 ENCOUNTER — Telehealth: Payer: Self-pay | Admitting: *Deleted

## 2019-01-25 DIAGNOSIS — K140 Glossitis: Secondary | ICD-10-CM | POA: Diagnosis not present

## 2019-01-25 MED ORDER — MAGIC MOUTHWASH W/LIDOCAINE: 5.0000 mL | Freq: Four times a day (QID) | ORAL | 1 refills | Status: DC | PRN

## 2019-01-25 NOTE — Progress Notes (Signed)
Subjective:    Patient ID: Dana [name], female    DOB: 10/27/1965, 53 y.o.   MRN: [id number]  HPI Virtual Visit via Video Note  I connected with the patient on 01/25/19 at  8:45 AM EDT by a video enabled telemedicine application and verified that I am speaking with the correct person using two identifiers.  Location patient: home Location provider:work or home office Persons participating in the virtual visit: patient, provider  I discussed the limitations of evaluation and management by telemedicine and the availability of in person appointments. The patient expressed understanding and agreed to proceed.   HPI: Here complaining of soreness on the bottom of her tongue on the left side that started one week ago. No hx of trauma to the tongue. She feels fine in general. No ST.    ROS: See pertinent positives and negatives per HPI.  Past Medical History:  Diagnosis Date  . Anxiety   . Back pain with radiation   . Breast mass, right   . Complication of anesthesia 04/07/14   Allergic reaction to Lisinopril immediately following surgery  . DJD (degenerative joint disease)   . Fibromyalgia   . Groin abscess   . Headache(784.0)   . History of IBS   . Hypercholesterolemia   . IBS (irritable bowel syndrome)   . Lactose intolerance   . Mild hypertension   . Obesity   . Tobacco use disorder   . Umbilical hernia    Symptomatic    Past Surgical History:  Procedure Laterality Date  . ABDOMINAL HYSTERECTOMY    . ANTERIOR CERVICAL DECOMP/DISCECTOMY FUSION N/A 12/20/2015   Procedure: ANTERIOR CERVICAL DECOMPRESSION FUSION CERVICAL 4-5, CERVICAL 5-6, CERVICAL 6-7 WITH INSTRUMENTATION AND ALLOGRAFT;  Surgeon: Phylliss Bob, MD;  Location: [facility];  Service: Orthopedics;  Laterality: N/A;  Anterior cervical decompression fusion, cervical 4-5, cervical 5-6, cervical 6-7 with instrumentation and allograft  . BACK SURGERY    . BILATERAL SALPINGECTOMY  09/03/2012   Procedure: BILATERAL  SALPINGECTOMY;  Surgeon: [name], MD;  Location: [facility];  Service: Gynecology;  Laterality: Bilateral;  . BREAST BIOPSY Right 04/07/2014   Procedure: REMOVAL RIGHT BREAST MASS WITH WIRE LOCALIZATION;  Surgeon: [name], MD;  Location: [facility];  Service: General;  Laterality: Right;  . BREAST EXCISIONAL BIOPSY Left    x2  . BREAST LUMPECTOMY     x2  . COLONOSCOPY W/ BIOPSIES  04/24/2012   per Dr. Carlean Purl, clear, repeat in 10 yrs   . disectomy    . ESOPHAGOGASTRODUODENOSCOPY    . FINGER SURGERY     Right index-excision of mass   . FOOT SURGERY Right    Bone Spurs  . LAPAROSCOPIC HYSTERECTOMY  09/03/2012   Procedure: HYSTERECTOMY TOTAL LAPAROSCOPIC;  Surgeon: [name], MD;  Location: [facility];  Service: Gynecology;  Laterality: N/A;  . LUMBAR [place]    . TUBAL LIGATION    . UMBILICAL HERNIA REPAIR N/A 05/01/2018   Procedure: UMBILICAL HERNIA REPAIR;  Surgeon: Judeth Horn, MD;  Location: [facility];  Service: General;  Laterality: N/A;    Family History  Problem Relation Age of Onset  . Diabetes Father   . Diabetes Mother   . Prostate cancer Paternal Grandfather   . Breast cancer Maternal Aunt 46     Current Outpatient Medications:  .  ALPRAZolam (XANAX) 0.5 MG tablet, alprazolam 0.5 mg tablet  TAKE A HALF TO ONE TABLET BY MOUTH 3 TIMES A DAY AS NEEDED,  Disp: , Rfl:  .  atorvastatin (LIPITOR) 20 MG tablet, Take 1 tablet (20 mg total) by mouth daily., Disp: 90 tablet, Rfl: 3 .  Cholecalciferol 50000 units TABS, Take 1 tablet by mouth 2 (two) times a week., Disp: 16 tablet, Rfl: 0 .  cyclobenzaprine (FLEXERIL) 10 MG tablet, Take 10 mg by mouth once daily at bedtime, Disp: 90 tablet, Rfl: 3 .  dorzolamide-timolol (COSOPT) 22.3-6.8 MG/ML ophthalmic solution, Place 1 drop into both eyes 2 (two) times daily., Disp: , Rfl:  .  furosemide (LASIX) 40 MG tablet, Take 2 tablets in the morning and 1 tablet in the evening, Disp: 270 tablet, Rfl: 3 .  gabapentin (NEURONTIN)  300 MG capsule, Start with 1 tab po qhs X 1 week, then increase to 1 tab po bid X 1 week then 1 tab po tid prn (Patient taking differently: Take 300 mg by mouth at bedtime. ), Disp: 90 capsule, Rfl: 1 .  hydrALAZINE (APRESOLINE) 25 MG tablet, Take 1 tablet (25 mg total) by mouth 3 (three) times daily., Disp: 90 tablet, Rfl: 5 .  hydrochlorothiazide (MICROZIDE) 12.5 MG capsule, hydrochlorothiazide 12.5 mg capsule, Disp: 90 capsule, Rfl: 3 .  HYDROmorphone (DILAUDID) 4 MG tablet, Take 1 tablet (4 mg total) by mouth every 6 (six) hours as needed for up to 30 days for severe pain., Disp: 120 tablet, Rfl: 0 .  ibuprofen (ADVIL,MOTRIN) 200 MG tablet, Take 400 mg by mouth daily as needed for headache or moderate pain., Disp: , Rfl:  .  ketorolac (ACULAR) 0.5 % ophthalmic solution, Place 1 drop into the left eye 4 (four) times daily., Disp: , Rfl:  .  latanoprost (XALATAN) 0.005 % ophthalmic solution, Place 1 drop into both eyes at bedtime., Disp: , Rfl:  .  nabumetone (RELAFEN) 500 MG tablet, Take 1 tablet (500 mg total) by mouth 2 (two) times daily., Disp: 60 tablet, Rfl: 6 .  naproxen (NAPROSYN) 500 MG tablet, Take 1 tablet (500 mg total) by mouth 2 (two) times daily with a meal., Disp: 60 tablet, Rfl: 4 .  ondansetron (ZOFRAN) 8 MG tablet, Take 1 tablet (8 mg total) by mouth every 8 (eight) hours as needed for nausea or vomiting., Disp: 30 tablet, Rfl: 0 .  potassium chloride (KLOR-CON 10) 10 MEQ tablet, Take 1 tablet (10 mEq total) by mouth 2 (two) times daily., Disp: 180 tablet, Rfl: 3 .  triamcinolone cream (KENALOG) 0.1 %, Apply 1 application topically daily as needed (for itching). , Disp: , Rfl: 3  Current Facility-Administered Medications:  .  methylPREDNISolone acetate (DEPO-MEDROL) injection 80 mg, 80 mg, Other, Once, Magnus Sinning, MD  EXAM:  VITALS per patient if applicable:  GENERAL: alert, oriented, appears well and in no acute distress  HEENT: atraumatic, conjunttiva clear, no  obvious abnormalities on inspection of external nose and ears  NECK: normal movements of the head and neck  LUNGS: on inspection no signs of respiratory distress, breathing rate appears normal, no obvious gross SOB, gasping or wheezing  CV: no obvious cyanosis  MS: moves all visible extremities without noticeable abnormality  PSYCH/NEURO: pleasant and cooperative, no obvious depression or anxiety, speech and thought processing grossly intact  ASSESSMENT AND PLAN: Glossitis, try Magic Mouthwash. Recheck prn.  [name], MD  Discussed the following assessment and plan:  No diagnosis found.     I discussed the assessment and treatment plan with the patient. The patient was provided an opportunity to ask questions and all were answered. The patient agreed  with the plan and demonstrated an understanding of the instructions.   The patient was advised to call back or seek an in-person evaluation if the symptoms worsen or if the condition fails to improve as anticipated.     Review of Systems     Objective:   Physical Exam        Assessment & Plan:

## 2019-01-25 NOTE — Telephone Encounter (Signed)
Pt has been scheduled to see Dr. Sarajane Jews at 845 today.

## 2019-01-25 NOTE — Telephone Encounter (Signed)
Copied from [place] [phone]. Topic: Appointment Scheduling - Scheduling Inquiry for Clinic >> Jan 25, 2019  7:47 AM Rayann Heman wrote: Reason for CRM: pt called and stated that she would like an appointment for tongue irritation. Please advise

## 2019-01-25 NOTE — Telephone Encounter (Signed)
Copied from [place] [id number]. Topic: Quick Communication - Rx Refill/Question >> Jan 25, 2019  6:07 PM Blase Mess A wrote: Medication:mgic mouthwash w/lidocaine SOLN [[id number]] The pharmacy did not receive this can this be resent please?  Has the patient contacted their pharmacy? Yes  (Agent: If yes, when and what did the pharmacy advise?)  Preferred Pharmacy (with phone number or street name):  [id number] [place], [place] [phone] (Phone) [phone] (Fax)     Agent: Please be advised that RX refills may take up to 3 business days. We ask that you follow-up with your pharmacy.

## 2019-01-26 NOTE — Telephone Encounter (Signed)
Patient calling and states that the pharmacy is being very rude to her and that they are telling her they have not received an order for this medication that was sent/phoned in on 01/25/2019. Patient would like to know if this could be sent to the pharmacy again? Please advise.

## 2019-01-26 NOTE — Telephone Encounter (Signed)
Refill was sent to the pharmacy on 5/11

## 2019-01-26 NOTE — Telephone Encounter (Signed)
The magic mouthwash has been called to the pharmacy and left on the VM for them to fill this for the pt.  I have called the pt and she is aware.

## 2019-01-28 ENCOUNTER — Ambulatory Visit: Payer: Self-pay | Admitting: Specialist

## 2019-02-04 ENCOUNTER — Encounter: Payer: Self-pay | Admitting: Specialist

## 2019-02-04 ENCOUNTER — Ambulatory Visit: Payer: Self-pay

## 2019-02-04 ENCOUNTER — Ambulatory Visit (INDEPENDENT_AMBULATORY_CARE_PROVIDER_SITE_OTHER): Payer: 59 | Admitting: Specialist

## 2019-02-04 ENCOUNTER — Other Ambulatory Visit: Payer: Self-pay

## 2019-02-04 VITALS — BP 151/86 | HR 98 | Ht 64.0 in | Wt 282.0 lb

## 2019-02-04 DIAGNOSIS — G5602 Carpal tunnel syndrome, left upper limb: Secondary | ICD-10-CM

## 2019-02-04 DIAGNOSIS — M7711 Lateral epicondylitis, right elbow: Secondary | ICD-10-CM

## 2019-02-04 DIAGNOSIS — M25521 Pain in right elbow: Secondary | ICD-10-CM

## 2019-02-04 NOTE — Progress Notes (Signed)
Office Visit Note   [name]           Date of Birth: 1966-02-03           MRN: [id number] Visit Date: 02/04/2019              Requested by: [name], MD [place], [place] [address] PCP: [name], MD   Assessment & Plan: Visit Diagnoses:  1. Pain in right elbow   2. Right tennis elbow   3. Carpal tunnel syndrome, left upper limb     Plan: Since patient did good good temporary diagnostic relief with previous left carpal tunnel injection patient advised that best course of treatment at this point would be outpatient surgery with left carpal tunnel release.  Surgery procedure discussed in detail.  All questions answered.  With her right elbow pain we will also perform tennis elbow Marcaine/Depo-Medrol injection while she is under anesthesia.  I did give her some stretching exercises to do for the right forearm.  Preop paperwork filled out and she will let us know when she is ready to schedule.  Follow-Up Instructions: Return for 1 week postop.   Orders:  Orders Placed This Encounter  Procedures  . XR Elbow 2 Views Right   No orders of the defined types were placed in this encounter.     Procedures: No procedures performed   Clinical Data: No additional findings.   Subjective: Chief Complaint  Patient presents with  . Left Hand - Follow-up    Had carpal tunnel injection on the left wrist on 4/2/20said she got relief for little while but not long    HPI 53 year old white female with history of left carpal tunnel syndrome returns.  Dr. Louanne Skye performed left carpal tunnel Marcaine/Depo-Medrol injection December 17, 2018 and patient states that this gave good relief for a couple of weeks.  Symptoms have since returned.  States that she is ready to proceed with scheduling left carpal tunnel release.  She is also complaining of worsening right lateral elbow pain.  Pain aggravated with gripping and lifting objects.  Some soreness down  to the forearm and wrist.  Review of systems No current cardiac pulmonary GI GU issues    Objective: Vital Signs: BP (!) 151/86 (BP Location: Left Arm, Patient Position: Sitting)   Pulse 98   Ht 5\' 4"  (1.626 m)   Wt 282 lb (127.9 kg)   LMP 08/17/2012   BMI 48.41 kg/m   Physical Exam Constitutional:      Appearance: She is obese.  HENT:     Head: Normocephalic.  Eyes:     Extraocular Movements: Extraocular movements intact.     Pupils: Pupils are equal, round, and reactive to light.  Pulmonary:     Effort: No respiratory distress.  Musculoskeletal:     Comments: Exam left wrist she has positive Tinel's and Phalen's.  Right elbow good range of motion.  She is moderate to market tender over the lateral condyle.  Pain lateral elbow aggravated with resisted wrist extension and forearm supination.  Patient also does have some tenderness over the right radial tunnel.  Skin:    General: Skin is warm and dry.  Neurological:     Mental Status: She is alert and oriented to person, place, and time.  Psychiatric:        Mood and Affect: Mood normal.     Ortho Exam  Specialty Comments:  No specialty comments available.  Imaging: No results found.   PMFS History: Patient Active Problem List   Diagnosis Date Noted  . Spinal stenosis of lumbar region with neurogenic claudication 08/20/2018  . 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 Primary osteoarthritis of right knee 02/27/2017  . Lateral epicondylitis of right elbow 11/11/2016  . Muscle cramps 01/15/2016  . Bilateral leg edema 01/08/2016  . Radiculopathy 12/20/2015  . Hyperglycemia 12/21/2014  . Mastodynia, female 11/30/2014  . S/P excision of fibroadenoma of breast 11/30/2014  . Angioedema of lips 04/07/2014  . Fibroadenoma of right breast 03/07/2014  . Pelvic pain in female 06/19/2012  . Menorrhagia 06/11/2012  . SUI (stress urinary incontinence, female) 06/11/2012  . HTN (hypertension) 06/11/2012  . IBS  (irritable bowel syndrome) - diarrhea predominant 03/17/2012  . MICROSCOPIC HEMATURIA 11/30/2010  . BREAST PAIN, RIGHT 11/30/2010  . BREAST MASS, RIGHT 01/12/2010  . HYPERCHOLESTEROLEMIA 12/07/2007  . CIGARETTE SMOKER 12/07/2007  . DEGENERATIVE JOINT DISEASE 12/07/2007  . Low back pain with sciatica 12/07/2007  . HEADACHE 12/07/2007  . Obesity 08/03/2007  . ANXIETY 08/03/2007   Past Medical History:  Diagnosis Date  . Anxiety   . Back pain with radiation   . Breast mass, right   . Complication of anesthesia 04/07/14   Allergic reaction to Lisinopril immediately following surgery  . DJD (degenerative joint disease)   . Fibromyalgia   . Groin abscess   . Headache(784.0)   . History of IBS   . Hypercholesterolemia   . IBS (irritable bowel syndrome)   . Lactose intolerance   . Mild hypertension   . Obesity   . Tobacco use disorder   . Umbilical hernia    Symptomatic    Family History  Problem Relation Age of Onset  . Diabetes Father   . Diabetes Mother   . Prostate cancer Paternal Grandfather   . Breast cancer Maternal Aunt 46    Past Surgical History:  Procedure Laterality Date  . ABDOMINAL HYSTERECTOMY    . ANTERIOR CERVICAL DECOMP/DISCECTOMY FUSION N/A 12/20/2015   Procedure: ANTERIOR CERVICAL DECOMPRESSION FUSION CERVICAL 4-5, CERVICAL 5-6, CERVICAL 6-7 WITH INSTRUMENTATION AND ALLOGRAFT;  Surgeon: Phylliss Bob, MD;  Location: [facility];  Service: Orthopedics;  Laterality: N/A;  Anterior cervical decompression fusion, cervical 4-5, cervical 5-6, cervical 6-7 with instrumentation and allograft  . BACK SURGERY    . BILATERAL SALPINGECTOMY  09/03/2012   Procedure: BILATERAL SALPINGECTOMY;  Surgeon: [name], MD;  Location: [facility];  Service: Gynecology;  Laterality: Bilateral;  . BREAST BIOPSY Right 04/07/2014   Procedure: REMOVAL RIGHT BREAST MASS WITH WIRE LOCALIZATION;  Surgeon: [name], MD;  Location: [facility];  Service: General;  Laterality: Right;  . BREAST  EXCISIONAL BIOPSY Left    x2  . BREAST LUMPECTOMY     x2  . COLONOSCOPY W/ BIOPSIES  04/24/2012   per Dr. Carlean Purl, clear, repeat in 10 yrs   . disectomy    . ESOPHAGOGASTRODUODENOSCOPY    . FINGER SURGERY     Right index-excision of mass   . FOOT SURGERY Right    Bone Spurs  . LAPAROSCOPIC HYSTERECTOMY  09/03/2012   Procedure: HYSTERECTOMY TOTAL LAPAROSCOPIC;  Surgeon: [name], MD;  Location: [facility];  Service: Gynecology;  Laterality: N/A;  . LUMBAR [place]    . TUBAL LIGATION    . UMBILICAL HERNIA REPAIR N/A 05/01/2018   Procedure: UMBILICAL HERNIA REPAIR;  Surgeon: Judeth Horn, MD;  Location: [facility];  Service: General;  Laterality: N/A;  Social History   Occupational History  . Occupation: Optometrist  Tobacco Use  . Smoking status: Former Smoker    Years: 33.00    Types: Cigarettes  . Smokeless tobacco: Never Used  . Tobacco comment: quit March 2017  Substance and Sexual Activity  . Alcohol use: Yes    Alcohol/week: 0.0 standard drinks    Comment: occ  . Drug use: No    Types: Amphetamines  . Sexual activity: Yes    Birth control/protection: Surgical    Comment: tubal ligation

## 2019-02-13 ENCOUNTER — Telehealth: Payer: Self-pay

## 2019-02-13 DIAGNOSIS — Z20822 Contact with and (suspected) exposure to covid-19: Secondary | ICD-10-CM

## 2019-02-13 NOTE — Telephone Encounter (Signed)
Patient called and advised of the potential exposure to COVID-19 at her last OV on 02/04/19 at [facility], advised of the free testing if agreeable per scripting. She agrees to the test, appointment scheduled for tomorrow, 02/14/19 at 1000 at [facility], advised location and for everyone to wear a mask who will be coming with her. She asked about her family members being tested, I advised once her results come back and if positive, then her immediate family may be tested, she verbalized understanding.

## 2019-02-14 ENCOUNTER — Other Ambulatory Visit: Payer: Self-pay

## 2019-02-14 ENCOUNTER — Emergency Department (HOSPITAL_COMMUNITY)
Admission: EM | Admit: 2019-02-14 | Discharge: 2019-02-14 | Disposition: A | Discharge status: Home / Self Care | Payer: 59 | Attending: Emergency Medicine | Admitting: Emergency Medicine

## 2019-02-14 ENCOUNTER — Other Ambulatory Visit: Payer: 59

## 2019-02-14 DIAGNOSIS — I10 Essential (primary) hypertension: Secondary | ICD-10-CM | POA: Diagnosis not present

## 2019-02-14 DIAGNOSIS — N3090 Cystitis, unspecified without hematuria: Secondary | ICD-10-CM | POA: Diagnosis not present

## 2019-02-14 DIAGNOSIS — Z20822 Contact with and (suspected) exposure to covid-19: Secondary | ICD-10-CM

## 2019-02-14 DIAGNOSIS — Z79899 Other long term (current) drug therapy: Secondary | ICD-10-CM | POA: Diagnosis not present

## 2019-02-14 DIAGNOSIS — Z87891 Personal history of nicotine dependence: Secondary | ICD-10-CM | POA: Diagnosis not present

## 2019-02-14 DIAGNOSIS — R300 Dysuria: Secondary | ICD-10-CM | POA: Diagnosis present

## 2019-02-14 LAB — WET PREP, GENITAL
Clue Cells Wet Prep HPF POC: NONE SEEN
Sperm: NONE SEEN
Trich, Wet Prep: NONE SEEN
Yeast Wet Prep HPF POC: NONE SEEN

## 2019-02-14 LAB — LIPASE, BLOOD: Lipase: 32 U/L (ref 11–51)

## 2019-02-14 LAB — CBC WITH DIFFERENTIAL/PLATELET
Abs Immature Granulocytes: 0.05 10*3/uL (ref 0.00–0.07)
Basophils Absolute: 0.1 10*3/uL (ref 0.0–0.1)
Basophils Relative: 0 %
Eosinophils Absolute: 0.1 10*3/uL (ref 0.0–0.5)
Eosinophils Relative: 1 %
HCT: 41.3 % (ref 36.0–46.0)
Hemoglobin: 13.2 g/dL (ref 12.0–15.0)
Immature Granulocytes: 0 %
Lymphocytes Relative: 14 %
Lymphs Abs: 1.9 10*3/uL (ref 0.7–4.0)
MCH: 30.6 pg (ref 26.0–34.0)
MCHC: 32 g/dL (ref 30.0–36.0)
MCV: 95.6 fL (ref 80.0–100.0)
Monocytes Absolute: 0.8 10*3/uL (ref 0.1–1.0)
Monocytes Relative: 6 %
Neutro Abs: 10.2 10*3/uL — ABNORMAL HIGH (ref 1.7–7.7)
Neutrophils Relative %: 79 %
Platelets: 250 10*3/uL (ref 150–400)
RBC: 4.32 MIL/uL (ref 3.87–5.11)
RDW: 14.9 % (ref 11.5–15.5)
WBC: 13.1 10*3/uL — ABNORMAL HIGH (ref 4.0–10.5)
nRBC: 0.2 % (ref 0.0–0.2)

## 2019-02-14 LAB — URINALYSIS, ROUTINE W REFLEX MICROSCOPIC
Bilirubin Urine: NEGATIVE
Glucose, UA: NEGATIVE mg/dL
Ketones, ur: NEGATIVE mg/dL
Nitrite: NEGATIVE
Protein, ur: 30 mg/dL — AB
Specific Gravity, Urine: 1.004 — ABNORMAL LOW (ref 1.005–1.030)
WBC, UA: >50 WBC/hpf — ABNORMAL HIGH (ref 0–5)
pH: 6 (ref 5.0–8.0)

## 2019-02-14 LAB — COMPREHENSIVE METABOLIC PANEL
ALT: 45 U/L — ABNORMAL HIGH (ref 0–44)
AST: 62 U/L — ABNORMAL HIGH (ref 15–41)
Albumin: 3.8 g/dL (ref 3.5–5.0)
Alkaline Phosphatase: 110 U/L (ref 38–126)
Anion gap: 14 (ref 5–15)
BUN: 11 mg/dL (ref 6–20)
CO2: 22 mmol/L (ref 22–32)
Calcium: 8.9 mg/dL (ref 8.9–10.3)
Chloride: 101 mmol/L (ref 98–111)
Creatinine, Ser: 0.91 mg/dL (ref 0.44–1.00)
GFR calc Af Amer: >60 mL/min (ref >60)
GFR calc non Af Amer: >60 mL/min (ref >60)
Glucose, Bld: 120 mg/dL — ABNORMAL HIGH (ref 70–99)
Potassium: 4.2 mmol/L (ref 3.5–5.1)
Sodium: 137 mmol/L (ref 135–145)
Total Bilirubin: 0.8 mg/dL (ref 0.3–1.2)
Total Protein: 7.2 g/dL (ref 6.5–8.1)

## 2019-02-14 MED ORDER — SODIUM CHLORIDE 0.9 % IV BOLUS
1000.0000 mL | Freq: Once | INTRAVENOUS | Status: AC
  Administered 2019-02-14: 1000 mL via INTRAVENOUS

## 2019-02-14 MED ORDER — ONDANSETRON HCL 4 MG/2ML IJ SOLN
4.0000 mg | Freq: Once | INTRAMUSCULAR | Status: AC
  Administered 2019-02-14: 14:00:00 4 mg via INTRAVENOUS
  Filled 2019-02-14: qty 2

## 2019-02-14 MED ORDER — HYDROMORPHONE HCL 1 MG/ML IJ SOLN
1.0000 mg | Freq: Once | INTRAMUSCULAR | Status: AC
  Administered 2019-02-14: 14:00:00 1 mg via INTRAVENOUS
  Filled 2019-02-14: qty 1

## 2019-02-14 MED ORDER — ONDANSETRON HCL 4 MG/2ML IJ SOLN
4.0000 mg | Freq: Once | INTRAMUSCULAR | Status: AC
  Administered 2019-02-14: 16:00:00 4 mg via INTRAVENOUS
  Filled 2019-02-14: qty 2

## 2019-02-14 MED ORDER — HYDROMORPHONE HCL 1 MG/ML IJ SOLN
2.0000 mg | Freq: Once | INTRAMUSCULAR | Status: AC
  Administered 2019-02-14: 17:00:00 2 mg via INTRAVENOUS
  Filled 2019-02-14: qty 2

## 2019-02-14 MED ORDER — MORPHINE SULFATE (PF) 4 MG/ML IV SOLN
4.0000 mg | Freq: Once | INTRAVENOUS | Status: AC
  Administered 2019-02-14: 16:00:00 4 mg via INTRAVENOUS
  Filled 2019-02-14: qty 1

## 2019-02-14 MED ORDER — SULFAMETHOXAZOLE-TRIMETHOPRIM 800-160 MG PO TABS: 1.0000 | ORAL_TABLET | Freq: Two times a day (BID) | ORAL | 0 refills | Status: DC

## 2019-02-14 MED ORDER — FOSFOMYCIN TROMETHAMINE 3 G PO PACK
3.0000 g | PACK | Freq: Once | ORAL | Status: AC
  Administered 2019-02-14: 19:00:00 3 g via ORAL
  Filled 2019-02-14: qty 3

## 2019-02-14 MED ORDER — SODIUM CHLORIDE 0.9 % IV SOLN
1.0000 g | Freq: Once | INTRAVENOUS | Status: AC
  Administered 2019-02-14: 17:00:00 1 g via INTRAVENOUS
  Filled 2019-02-14: qty 10

## 2019-02-14 NOTE — Discharge Instructions (Addendum)
You have been seen in the Emergency Department (ED) today for pain when urinating.  Your workup today suggests that you have a urinary tract infection (UTI).  Please take your antibiotic as prescribed and over-the-counter pain medication (Tylenol or Motrin) as needed, but no more than recommended on the label instructions.  Drink PLENTY of fluids.  You were treated for your UTI in the emergency department with a one time dose of fosfomycin. If your culture shows you need a different antibiotic you will be called by the hospital to be made aware.  Call your regular doctor to schedule the next available appointment to follow up on todays ED visit, or return immediately to the ED if your pain worsens, you have decreased urine production, develop fever, persistent vomiting, or other symptoms that concern you.

## 2019-02-14 NOTE — ED Notes (Signed)
All appropriate discharge materials reviewed with patient at length. Time for questions provided. Pt denies any further questions at this time. Verbalizes understanding of all provided materials.  

## 2019-02-14 NOTE — ED Notes (Signed)
This RN acting as Art therapist and asked pt if she would like for me to contact any family/friends. Pt declined and states she is able to keep them informed.

## 2019-02-14 NOTE — ED Notes (Signed)
Pelvic complete  Specimens to lab

## 2019-02-14 NOTE — ED Triage Notes (Addendum)
3 day hx of dysuria and foul smelling urine, progressing today to N/V and lower diffuse abd pain.  Pt states feels she needs to go and cannot empty bladder. Teary eyed and able to ambulate to BR for a sample.

## 2019-02-14 NOTE — ED Provider Notes (Addendum)
[facility] EMERGENCY DEPARTMENT Provider Note   CSN: [id number] Arrival date & time: 02/14/19  1256    History   Chief Complaint Abdominal pain, dysuria  HPI Dana [name] is a 53 y.o. female with history of anxiety, fibromyalgia presents to emergency department today with chief complaint of dysuria x3 days.  She admits to foul-smelling urine as well as dark color.  This morning while attempting to eat breakfast she started to have abdominal pain located in her lower abdomen that she describes as sharp and cramping.  The pain has been constant since onset.  Also reports associated nausea and vomiting, estimating 2 episodes of nonbloody nonbilious emesis.  Patient did not take any medications for her pain prior to arrival.  She noticed this morning she had thick white vaginal discharge.  She is sexually active with one female partner, she is not concerned for STDs.  She denies fever, chills, back pain, flank pain, gross hematuria. Surgical history includes hysterectomy and umbilical hernia repair.  History provided by patient with additional history obtained from chart review.     Past Medical History:  Diagnosis Date   Anxiety    Back pain with radiation    Breast mass, right    Complication of anesthesia 04/07/14   Allergic reaction to Lisinopril immediately following surgery   DJD (degenerative joint disease)    Fibromyalgia    Groin abscess    Headache(784.0)    History of IBS    Hypercholesterolemia    IBS (irritable bowel syndrome)    Lactose intolerance    Mild hypertension    Obesity    Tobacco use disorder    Umbilical hernia    Symptomatic    Patient Active Problem List   Diagnosis Date Noted   Spinal stenosis of lumbar region with neurogenic claudication 08/20/2018   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   Primary osteoarthritis of right knee 02/27/2017   Lateral epicondylitis of right elbow 11/11/2016   Muscle cramps  01/15/2016   Bilateral leg edema 01/08/2016   Radiculopathy 12/20/2015   Hyperglycemia 12/21/2014   Mastodynia, female 11/30/2014   S/P excision of fibroadenoma of breast 11/30/2014   Angioedema of lips 04/07/2014   Fibroadenoma of right breast 03/07/2014   Pelvic pain in female 06/19/2012   Menorrhagia 06/11/2012   SUI (stress urinary incontinence, female) 06/11/2012   HTN (hypertension) 06/11/2012   IBS (irritable bowel syndrome) - diarrhea predominant 03/17/2012   MICROSCOPIC HEMATURIA 11/30/2010   BREAST PAIN, RIGHT 11/30/2010   BREAST MASS, RIGHT 01/12/2010   HYPERCHOLESTEROLEMIA 12/07/2007   CIGARETTE SMOKER 12/07/2007   DEGENERATIVE JOINT DISEASE 12/07/2007   Low back pain with sciatica 12/07/2007   HEADACHE 12/07/2007   Obesity 08/03/2007   ANXIETY 08/03/2007    Past Surgical History:  Procedure Laterality Date   ABDOMINAL HYSTERECTOMY     ANTERIOR CERVICAL DECOMP/DISCECTOMY FUSION N/A 12/20/2015   Procedure: ANTERIOR CERVICAL DECOMPRESSION FUSION CERVICAL 4-5, CERVICAL 5-6, CERVICAL 6-7 WITH INSTRUMENTATION AND ALLOGRAFT;  Surgeon: Phylliss Bob, MD;  Location: [place];  Service: Orthopedics;  Laterality: N/A;  Anterior cervical decompression fusion, cervical 4-5, cervical 5-6, cervical 6-7 with instrumentation and allograft   BACK SURGERY     BILATERAL SALPINGECTOMY  09/03/2012   Procedure: BILATERAL SALPINGECTOMY;  Surgeon: [name], MD;  Location: [facility];  Service: Gynecology;  Laterality: Bilateral;   BREAST BIOPSY Right 04/07/2014   Procedure: REMOVAL RIGHT BREAST MASS WITH WIRE LOCALIZATION;  Surgeon: [name], MD;  Location:  [facility];  Service: General;  Laterality: Right;   BREAST EXCISIONAL BIOPSY Left    x2   BREAST LUMPECTOMY     x2   COLONOSCOPY W/ BIOPSIES  04/24/2012   per Dr. Carlean Purl, clear, repeat in 10 yrs    disectomy     ESOPHAGOGASTRODUODENOSCOPY     FINGER SURGERY     Right index-excision of mass     FOOT SURGERY Right    Bone Spurs   LAPAROSCOPIC HYSTERECTOMY  09/03/2012   Procedure: HYSTERECTOMY TOTAL LAPAROSCOPIC;  Surgeon: [name], MD;  Location: [facility];  Service: Gynecology;  Laterality: N/A;   LUMBAR DISC SURGERY     TUBAL LIGATION     UMBILICAL HERNIA REPAIR N/A 05/01/2018   Procedure: UMBILICAL HERNIA REPAIR;  Surgeon: Judeth Horn, MD;  Location: [place];  Service: General;  Laterality: N/A;     OB History    Gravida  3   Para  3   Term  3   Preterm      AB      Living  4     SAB      TAB      Ectopic      Multiple  1   Live Births  4            Home Medications    Prior to Admission medications   Medication Sig Start Date End Date Taking? Authorizing Provider  cyclobenzaprine (FLEXERIL) 10 MG tablet Take 10 mg by mouth once daily at bedtime 11/16/18  Yes Jessy Oto, MD  dorzolamide-timolol (COSOPT) 22.3-6.8 MG/ML ophthalmic solution Place 1 drop into both eyes as needed.    Yes [provider]  furosemide (LASIX) 40 MG tablet Take 2 tablets in the morning and 1 tablet in the evening Patient taking differently: Take 40 mg by mouth daily.  08/10/18  Yes [name], MD  hydrALAZINE (APRESOLINE) 25 MG tablet Take 1 tablet (25 mg total) by mouth 3 (three) times daily. 12/15/17  Yes [name], MD  HYDROmorphone (DILAUDID) 4 MG tablet Take 1 tablet (4 mg total) by mouth every 6 (six) hours as needed for up to 30 days for severe pain. Patient taking differently: Take 4 mg by mouth daily. Back problems 01/20/19 02/19/19 Yes [name], MD  potassium chloride (KLOR-CON 10) 10 MEQ tablet Take 1 tablet (10 mEq total) by mouth 2 (two) times daily. Patient taking differently: Take 10 mEq by mouth daily.  08/10/18  Yes [name], MD  atorvastatin (LIPITOR) 20 MG tablet Take 1 tablet (20 mg total) by mouth daily. Patient not taking: Reported on 02/14/2019 04/29/16   [name], MD  Cholecalciferol 50000 units TABS Take 1 tablet  by mouth 2 (two) times a week. Patient not taking: Reported on 02/14/2019 09/17/17   Gerda Diss, DO  gabapentin (NEURONTIN) 300 MG capsule Start with 1 tab po qhs X 1 week, then increase to 1 tab po bid X 1 week then 1 tab po tid prn Patient not taking: Reported on 02/14/2019 09/12/17   Gerda Diss, DO  hydrochlorothiazide (MICROZIDE) 12.5 MG capsule hydrochlorothiazide 12.5 mg capsule Patient not taking: Reported on 02/14/2019 08/10/18   [name], MD  magic mouthwash w/lidocaine SOLN Take 5 mLs by mouth 4 (four) times daily as needed for mouth pain. Patient not taking: Reported on 02/14/2019 01/25/19   [name], MD  nabumetone (RELAFEN) 500 MG tablet Take 1  tablet (500 mg total) by mouth 2 (two) times daily. Patient not taking: Reported on 02/14/2019 11/13/17   Jessy Oto, MD  naproxen (NAPROSYN) 500 MG tablet Take 1 tablet (500 mg total) by mouth 2 (two) times daily with a meal. Patient not taking: Reported on 02/14/2019 10/15/18   Jessy Oto, MD  ondansetron (ZOFRAN) 8 MG tablet Take 1 tablet (8 mg total) by mouth every 8 (eight) hours as needed for nausea or vomiting. Patient not taking: Reported on 02/14/2019 02/03/18   [name], MD  sulfamethoxazole-trimethoprim (BACTRIM DS) 800-160 MG tablet Take 1 tablet by mouth 2 (two) times daily for 3 days. 02/14/19 02/17/19  Xiong Haidar, Harley Hallmark, PA-C    Family History Family History  Problem Relation Age of Onset   Diabetes Father    Diabetes Mother    Prostate cancer Paternal Grandfather    Breast cancer Maternal Aunt 46    Social History Social History   Tobacco Use   Smoking status: Former Smoker    Years: 33.00    Types: Cigarettes   Smokeless tobacco: Never Used   Tobacco comment: quit March 2017  Substance Use Topics   Alcohol use: Yes    Alcohol/week: 0.0 standard drinks    Comment: occ   Drug use: No    Types: Amphetamines     Allergies   Lisinopril; Codeine; Hydrocodone; and  Penicillins   Review of Systems Review of Systems  Constitutional: Negative for chills and fever.  HENT: Negative for congestion, ear discharge, ear pain, sinus pressure, sinus pain and sore throat.   Eyes: Negative for pain and redness.  Respiratory: Negative for cough and shortness of breath.   Cardiovascular: Negative for chest pain.  Gastrointestinal: Positive for abdominal pain, nausea and vomiting. Negative for constipation and diarrhea.  Genitourinary: Positive for dysuria and vaginal discharge. Negative for hematuria, vaginal bleeding and vaginal pain.  Musculoskeletal: Negative for back pain and neck pain.  Skin: Negative for wound.  Neurological: Negative for weakness, numbness and headaches.     Physical Exam Updated Vital Signs BP (!) 155/88 (BP Location: Left Arm)    Pulse (!) 122    Temp 98.3 F (36.8 C) (Oral)    Resp (!) 22    Ht 5\' 4"  (1.626 m)    Wt 128.8 kg    LMP 08/17/2012    SpO2 100%    BMI 48.75 kg/m   Physical Exam Vitals signs and nursing note reviewed.  Constitutional:      General: She is not in acute distress.    Appearance: She is not ill-appearing.     Comments: Patient is tearful during exam, in no acute distress  HENT:     Head: Normocephalic and atraumatic.     Right Ear: Tympanic membrane and external ear normal.     Left Ear: Tympanic membrane and external ear normal.     Nose: Nose normal.     Mouth/Throat:     Mouth: Mucous membranes are moist.     Pharynx: Oropharynx is clear.  Eyes:     General: No scleral icterus.       Right eye: No discharge.        Left eye: No discharge.     Extraocular Movements: Extraocular movements intact.     Conjunctiva/sclera: Conjunctivae normal.     Pupils: Pupils are equal, round, and reactive to light.  Neck:     Musculoskeletal: Normal range of motion.     Vascular:  No JVD.  Cardiovascular:     Rate and Rhythm: Regular rhythm. Tachycardia present.     Pulses: Normal pulses.          Radial  pulses are 2+ on the right side and 2+ on the left side.     Heart sounds: Normal heart sounds.  Pulmonary:     Comments: Lungs clear to auscultation in all fields. Symmetric chest rise. No wheezing, rales, or rhonchi. Abdominal:     Comments: Tender to palpation in right and left lower quadrants with guarding.  No rigidity. No peritoneal signs.  Genitourinary:    Comments: Normal external genitalia. No pain with speculum insertion. Closed cervical os with normal appearance - no rash or lesions. No significant discharge or bleeding noted from cervix or in vaginal vault. On bimanual examination no adnexal tenderness or cervical motion tenderness. Chaperone Chartered certified accountant present during exam.  Musculoskeletal: Normal range of motion.  Skin:    General: Skin is warm and dry.     Capillary Refill: Capillary refill takes less than 2 seconds.  Neurological:     Mental Status: She is oriented to person, place, and time.     GCS: GCS eye subscore is 4. GCS verbal subscore is 5. GCS motor subscore is 6.     Comments: Fluent speech, no facial droop.  Psychiatric:        Behavior: Behavior normal.      ED Treatments / Results  Labs (all labs ordered are listed, but only abnormal results are displayed) Labs Reviewed  WET PREP, GENITAL - Abnormal; Notable for the following components:      Result Value   WBC, Wet Prep HPF POC FEW (*)    All other components within normal limits  URINALYSIS, ROUTINE W REFLEX MICROSCOPIC - Abnormal; Notable for the following components:   APPearance CLOUDY (*)    Specific Gravity, Urine 1.004 (*)    Hgb urine dipstick MODERATE (*)    Protein, ur 30 (*)    Leukocytes,Ua LARGE (*)    WBC, UA >50 (*)    Bacteria, UA RARE (*)    All other components within normal limits  CBC WITH DIFFERENTIAL/PLATELET - Abnormal; Notable for the following components:   WBC 13.1 (*)    Neutro Abs 10.2 (*)    All other components within normal limits  COMPREHENSIVE METABOLIC  PANEL - Abnormal; Notable for the following components:   Glucose, Bld 120 (*)    AST 62 (*)    ALT 45 (*)    All other components within normal limits  URINE CULTURE  LIPASE, BLOOD  GC/CHLAMYDIA PROBE AMP ([place]) NOT AT [facility]    EKG None  Radiology No results found.  Procedures Procedures (including critical care time)  Medications Ordered in ED Medications  sodium chloride 0.9 % bolus 1,000 mL (0 mLs Intravenous Stopped 02/14/19 1554)  ondansetron (ZOFRAN) injection 4 mg (4 mg Intravenous Given 02/14/19 1406)  HYDROmorphone (DILAUDID) injection 1 mg (1 mg Intravenous Given 02/14/19 1406)  morphine 4 MG/ML injection 4 mg (4 mg Intravenous Given 02/14/19 1553)  ondansetron (ZOFRAN) injection 4 mg (4 mg Intravenous Given 02/14/19 1551)  HYDROmorphone (DILAUDID) injection 2 mg (2 mg Intravenous Given 02/14/19 1720)  cefTRIAXone (ROCEPHIN) 1 g in sodium chloride 0.9 % 100 mL IVPB (0 g Intravenous Stopped 02/14/19 1843)  fosfomycin (MONUROL) packet 3 g (3 g Oral Given 02/14/19 1929)     Initial Impression / Assessment and Plan / ED Course  I have reviewed the triage vital signs and the nursing notes.  Pertinent labs & imaging results that were available during my care of the patient were reviewed by me and considered in my medical decision making (see chart for details).  Patient is tearful on arrival, in no acute distress.  She is afebrile.  She is tachycardic to 122 in the setting of anxiety.  On exam she has tenderness bilateral lower quadrants, no CVA tenderness. DDx includes UTI, pyelonephritis, unlikely kidney stone given bilateral pain. Takes p.o. Dilaudid at home, she did not take her morning dose.  Will give IV Dilaudid for pain.  UA shows infection with large leukocytes over 50 WBC, culture sent.  Does have a leukocytosis of 13.1, CMP unremarkable, lipase within normal limits.  Patient tolerated pelvic exam without pain, no cervical motion tenderness, no discharge noted.  Will  give IV Rocephin for UTI and second dose of pain medication. Pt is not concerned for STI, will not treat at this time. She declines HIV/syphilis testing. On reassessment pt reports pain has improved and nausea has resolved after zofran. She is tolerating PO intake. Tachycardia improved. Treated with fosfomycin today for UTI. Patient is hemodynamically stable, in NAD. Evaluation does not show pathology that would require ongoing emergent intervention or inpatient treatment. I explained the diagnosis to the patient. Pain has been managed and has no complaints prior to discharge. Patient is comfortable with above plan and is stable for discharge at this time. All questions were answered prior to disposition. Strict return precautions for returning to the ED were discussed. Encouraged follow up with PCP. Findings and plan of care discussed with supervising physician Dr. Venora Maples.  This note was prepared using Dragon voice recognition software and may include unintentional dictation errors due to the inherent limitations of voice recognition software.     Final Clinical Impressions(s) / ED Diagnoses   Final diagnoses:  Cystitis    ED Discharge Orders         Ordered    sulfamethoxazole-trimethoprim (BACTRIM DS) 800-160 MG tablet  2 times daily     02/14/19 1906           Cherre Robins, PA-C 02/14/19 1947    Cherre Robins, PA-C 02/14/19 Samella Parr, MD 02/18/19 [phone]

## 2019-02-15 LAB — URINE CULTURE

## 2019-02-15 LAB — GC/CHLAMYDIA PROBE AMP (~~LOC~~) NOT AT ARMC
Chlamydia: NEGATIVE
Neisseria Gonorrhea: NEGATIVE

## 2019-02-15 LAB — NOVEL CORONAVIRUS, NAA: SARS-CoV-2, NAA: NOT DETECTED

## 2019-02-26 ENCOUNTER — Other Ambulatory Visit: Payer: Self-pay

## 2019-02-26 ENCOUNTER — Encounter: Payer: Self-pay | Admitting: Family Medicine

## 2019-02-26 ENCOUNTER — Telehealth: Payer: Self-pay | Admitting: Physical Medicine and Rehabilitation

## 2019-02-26 ENCOUNTER — Ambulatory Visit (INDEPENDENT_AMBULATORY_CARE_PROVIDER_SITE_OTHER): Payer: 59 | Admitting: Family Medicine

## 2019-02-26 DIAGNOSIS — M5440 Lumbago with sciatica, unspecified side: Secondary | ICD-10-CM | POA: Diagnosis not present

## 2019-02-26 DIAGNOSIS — F1190 Opioid use, unspecified, uncomplicated: Secondary | ICD-10-CM

## 2019-02-26 DIAGNOSIS — G8929 Other chronic pain: Secondary | ICD-10-CM

## 2019-02-26 NOTE — Telephone Encounter (Signed)
ok 

## 2019-02-26 NOTE — Telephone Encounter (Signed)
Scheduled for 6/24 with driver.

## 2019-02-26 NOTE — Progress Notes (Signed)
Subjective:    Patient ID: Dana [name], female    DOB: 1966/02/19, 53 y.o.   MRN: [id number]  HPI Here for pain management. Her back pain is bothering her a bit more lately. She is scheduled for another epidural steroid injection per Dr. Ernestina [name] on 03-11-19. Indication for chronic opioid: low back pain Medication and dose: Dilaudid 4 mg # pills per month: 120 Last UDS date: 12-15-17 Opioid Treatment Agreement signed (Y/N): 12-19-17 Opioid Treatment Agreement last reviewed with patient:  02-26-19 [place] reviewed this encounter (include red flags):  02-26-19 Virtual Visit via Video Note  I connected with the patient on 02/26/19 at  3:00 PM EDT by a video enabled telemedicine application and verified that I am speaking with the correct person using two identifiers.  Location patient: home Location provider:work or home office Persons participating in the virtual visit: patient, provider  I discussed the limitations of evaluation and management by telemedicine and the availability of in person appointments. The patient expressed understanding and agreed to proceed.   HPI:    ROS: See pertinent positives and negatives per HPI.  Past Medical History:  Diagnosis Date  . Anxiety   . Back pain with radiation   . Breast [name], right   . Complication of anesthesia 04/07/14   Allergic reaction to Lisinopril immediately following surgery  . DJD (degenerative joint disease)   . Fibromyalgia   . Groin abscess   . Headache(784.0)   . History of IBS   . Hypercholesterolemia   . IBS (irritable bowel syndrome)   . Lactose intolerance   . Mild hypertension   . Obesity   . Tobacco use disorder   . Umbilical hernia    Symptomatic    Past Surgical History:  Procedure Laterality Date  . ABDOMINAL HYSTERECTOMY    . ANTERIOR CERVICAL DECOMP/DISCECTOMY FUSION N/A 12/20/2015   Procedure: ANTERIOR CERVICAL DECOMPRESSION FUSION CERVICAL 4-5, CERVICAL 5-6, CERVICAL 6-7 WITH INSTRUMENTATION AND  ALLOGRAFT;  Surgeon: Dana Bob, MD;  Location: [facility];  Service: Orthopedics;  Laterality: N/A;  Anterior cervical decompression fusion, cervical 4-5, cervical 5-6, cervical 6-7 with instrumentation and allograft  . BACK SURGERY    . BILATERAL SALPINGECTOMY  09/03/2012   Procedure: BILATERAL SALPINGECTOMY;  Surgeon: Dana Mass, MD;  Location: [facility];  Service: Gynecology;  Laterality: Bilateral;  . BREAST BIOPSY Right 04/07/2014   Procedure: REMOVAL RIGHT BREAST [name] WITH WIRE LOCALIZATION;  Surgeon: Dana Hollingshead, MD;  Location: [facility];  Service: General;  Laterality: Right;  . BREAST EXCISIONAL BIOPSY Left    x2  . BREAST LUMPECTOMY     x2  . COLONOSCOPY W/ BIOPSIES  04/24/2012   per Dr. Carlean [name], clear, repeat in 10 yrs   . disectomy    . ESOPHAGOGASTRODUODENOSCOPY    . FINGER SURGERY     Right index-excision of [name]   . FOOT SURGERY Right    Bone Spurs  . LAPAROSCOPIC HYSTERECTOMY  09/03/2012   Procedure: HYSTERECTOMY TOTAL LAPAROSCOPIC;  Surgeon: Dana Mass, MD;  Location: [facility];  Service: Gynecology;  Laterality: N/A;  . LUMBAR [place]    . TUBAL LIGATION    . UMBILICAL HERNIA REPAIR N/A 05/01/2018   Procedure: UMBILICAL HERNIA REPAIR;  Surgeon: Judeth Horn, MD;  Location: [facility];  Service: General;  Laterality: N/A;    Family History  Problem Relation Age of Onset  . Diabetes Father   . Diabetes Mother   . Prostate cancer Paternal Grandfather   .  Breast cancer Maternal Aunt 46     Current Outpatient Medications:  .  atorvastatin (LIPITOR) 20 MG tablet, Take 1 tablet (20 mg total) by mouth daily., Disp: 90 tablet, Rfl: 3 .  Cholecalciferol 50000 units TABS, Take 1 tablet by mouth 2 (two) times a week., Disp: 16 tablet, Rfl: 0 .  cyclobenzaprine (FLEXERIL) 10 MG tablet, Take 10 mg by mouth once daily at bedtime, Disp: 90 tablet, Rfl: 3 .  dorzolamide-timolol (COSOPT) 22.3-6.8 MG/ML ophthalmic solution, Place 1 drop into both eyes as needed. , Disp: ,  Rfl:  .  furosemide (LASIX) 40 MG tablet, Take 2 tablets in the morning and 1 tablet in the evening (Patient taking differently: Take 40 mg by mouth daily. ), Disp: 270 tablet, Rfl: 3 .  gabapentin (NEURONTIN) 300 MG capsule, Start with 1 tab po qhs X 1 week, then increase to 1 tab po bid X 1 week then 1 tab po tid prn, Disp: 90 capsule, Rfl: 1 .  hydrALAZINE (APRESOLINE) 25 MG tablet, Take 1 tablet (25 mg total) by mouth 3 (three) times daily., Disp: 90 tablet, Rfl: 5 .  hydrochlorothiazide (MICROZIDE) 12.5 MG capsule, hydrochlorothiazide 12.5 mg capsule, Disp: 90 capsule, Rfl: 3 .  magic mouthwash w/lidocaine SOLN, Take 5 mLs by mouth 4 (four) times daily as needed for mouth pain., Disp: 100 mL, Rfl: 1 .  nabumetone (RELAFEN) 500 MG tablet, Take 1 tablet (500 mg total) by mouth 2 (two) times daily., Disp: 60 tablet, Rfl: 6 .  naproxen (NAPROSYN) 500 MG tablet, Take 1 tablet (500 mg total) by mouth 2 (two) times daily with a meal., Disp: 60 tablet, Rfl: 4 .  ondansetron (ZOFRAN) 8 MG tablet, Take 1 tablet (8 mg total) by mouth every 8 (eight) hours as needed for nausea or vomiting., Disp: 30 tablet, Rfl: 0 .  potassium chloride (KLOR-CON 10) 10 MEQ tablet, Take 1 tablet (10 mEq total) by mouth 2 (two) times daily. (Patient taking differently: Take 10 mEq by mouth daily. ), Disp: 180 tablet, Rfl: 3  Current Facility-Administered Medications:  .  methylPREDNISolone acetate (DEPO-MEDROL) injection 80 mg, 80 mg, Other, Once, Dana Sinning, MD  EXAM:  VITALS per patient if applicable:  GENERAL: alert, oriented, appears well and in no acute distress  HEENT: atraumatic, conjunttiva clear, no obvious abnormalities on inspection of external nose and ears  NECK: normal movements of the head and neck  LUNGS: on inspection no signs of respiratory distress, breathing rate appears normal, no obvious gross SOB, gasping or wheezing  CV: no obvious cyanosis  MS: moves all visible extremities without  noticeable abnormality  PSYCH/NEURO: pleasant and cooperative, no obvious depression or anxiety, speech and thought processing grossly intact  ASSESSMENT AND PLAN: Pain management, meds were refilled.  Dana Penna, MD  Discussed the following assessment and plan:  No diagnosis found.     I discussed the assessment and treatment plan with the patient. The patient was provided an opportunity to ask questions and all were answered. The patient agreed with the plan and demonstrated an understanding of the instructions.   The patient was advised to call back or seek an in-person evaluation if the symptoms worsen or if the condition fails to improve as anticipated.    Review of Systems     Objective:   Physical Exam        Assessment & Plan:

## 2019-03-01 ENCOUNTER — Telehealth: Payer: Self-pay | Admitting: Family Medicine

## 2019-03-01 NOTE — Telephone Encounter (Signed)
Copied from [place] [id number]. Topic: Quick Communication - Rx Refill/Question >> Mar 01, 2019 12:54 PM Dana [name] wrote: Medication: Pt needs her pain medication called into pharmacy/ please advise   hydrALAZINE (APRESOLINE) 25 MG tablet  Has the patient contacted their pharmacy? Yes- no refill   Preferred Pharmacy (with phone number or street name): [id number] [address], [place] [address] [phone] (Phone) [phone] (Fax)    Agent: Please be advised that RX refills may take up to 3 business days. We ask that you follow-up with your pharmacy.

## 2019-03-01 NOTE — Telephone Encounter (Signed)
Dr. Fry please advise on refill. Thanks  

## 2019-03-02 MED ORDER — HYDROMORPHONE HCL 4 MG PO TABS: 4.0000 mg | ORAL_TABLET | Freq: Four times a day (QID) | ORAL | 0 refills | Status: DC | PRN

## 2019-03-02 MED ORDER — HYDROMORPHONE HCL 4 MG PO TABS: 4.0000 mg | ORAL_TABLET | Freq: Four times a day (QID) | ORAL | 0 refills | Status: AC | PRN

## 2019-03-02 NOTE — Telephone Encounter (Signed)
Pt needs the dilaudid filled not the hydralzine.  Please advise. Thanks

## 2019-03-02 NOTE — Telephone Encounter (Signed)
Done

## 2019-03-02 NOTE — Telephone Encounter (Signed)
HYDROmorphone (DILAUDID) 4 MG tablet [[id number]]  Pt called and stated that she does not need the HYdralazine filled.   Pt states that she is in a lot of pain. Please advise   Cb# [phone]

## 2019-03-11 ENCOUNTER — Ambulatory Visit (INDEPENDENT_AMBULATORY_CARE_PROVIDER_SITE_OTHER): Payer: 59 | Admitting: Physical Medicine and Rehabilitation

## 2019-03-11 ENCOUNTER — Other Ambulatory Visit: Payer: Self-pay

## 2019-03-11 ENCOUNTER — Ambulatory Visit: Payer: Self-pay

## 2019-03-11 ENCOUNTER — Encounter: Payer: Self-pay | Admitting: Physical Medicine and Rehabilitation

## 2019-03-11 VITALS — BP 135/80 | HR 101

## 2019-03-11 DIAGNOSIS — M5416 Radiculopathy, lumbar region: Secondary | ICD-10-CM

## 2019-03-11 DIAGNOSIS — M47816 Spondylosis without myelopathy or radiculopathy, lumbar region: Secondary | ICD-10-CM

## 2019-03-11 MED ORDER — METHYLPREDNISOLONE ACETATE 80 MG/ML IJ SUSP: 80.0000 mg | Freq: Once | INTRAMUSCULAR | Status: DC

## 2019-03-11 NOTE — Progress Notes (Signed)
Numeric Pain Rating Scale and Functional Assessment Average Pain 9   In the last MONTH (on 0-10 scale) has pain interfered with the following?  1. General activity like being  able to carry out your everyday physical activities such as walking, climbing stairs, carrying groceries, or moving a chair?  Rating(10+)   +Driver, -BT, -Dye Allergies.  

## 2019-03-12 ENCOUNTER — Ambulatory Visit: Payer: 59 | Admitting: Family Medicine

## 2019-03-18 NOTE — Procedures (Signed)
Lumbosacral Transforaminal Epidural Steroid Injection - Sub-Pedicular Approach with Fluoroscopic Guidance  [name]      Date of Birth: 02-May-1966 MRN: [id number] PCP: [name], MD      Visit Date: 03/11/2019   Universal Protocol:    Date/Time: 03/11/2019  Consent Given By: the patient  Position: PRONE  Additional Comments: Vital signs were monitored before and after the procedure. Patient was prepped and draped in the usual sterile fashion. The correct patient, procedure, and site was verified.   Injection Procedure Details:  Procedure Site One Meds Administered:  Meds ordered this encounter  Medications  . methylPREDNISolone acetate (DEPO-MEDROL) injection 80 mg    Laterality: Right  Location/Site:  L4-L5  Needle size: 22 G  Needle type: Spinal  Needle Placement: Transforaminal  Findings:    -Comments: Excellent flow of contrast along the nerve and into the epidural space.  Procedure Details: After squaring off the end-plates to get a true AP view, the C-arm was positioned so that an oblique view of the foramen as noted above was visualized. The target area is just inferior to the "nose of the scotty dog" or sub pedicular. The soft tissues overlying this structure were infiltrated with 2-3 ml. of 1% Lidocaine without Epinephrine.  The spinal needle was inserted toward the target using a "trajectory" view along the fluoroscope beam.  Under AP and lateral visualization, the needle was advanced so it did not puncture dura and was located close the 6 O'Clock position of the pedical in AP tracterory. Biplanar projections were used to confirm position. Aspiration was confirmed to be negative for CSF and/or blood. A 1-2 ml. volume of Isovue-250 was injected and flow of contrast was noted at each level. Radiographs were obtained for documentation purposes.   After attaining the desired flow of contrast documented above, a 0.5 to 1.0 ml test dose of  0.25% Marcaine was injected into each respective transforaminal space.  The patient was observed for 90 seconds post injection.  After no sensory deficits were reported, and normal lower extremity motor function was noted,   the above injectate was administered so that equal amounts of the injectate were placed at each foramen (level) into the transforaminal epidural space.   Additional Comments:  The patient tolerated the procedure well Dressing: 2 x 2 sterile gauze and Band-Aid    Post-procedure details: Patient was observed during the procedure. Post-procedure instructions were reviewed.  Patient left the clinic in stable condition.

## 2019-03-18 NOTE — Progress Notes (Signed)
Dana [name] - 53 y.o. female MRN [id number]  Date of birth: 1965/12/18  Office Visit Note: Visit Date: 03/11/2019 PCP: [name], MD Referred by: [name], MD  Subjective: Chief Complaint  Patient presents with  . Lower Back - Pain   HPI:  Dana [name] is a 53 y.o. female who comes in today For planned right L4 transforaminal epidural steroid injection for right hip and leg pain consistent with radicular type pain from pretty severe stenosis at L4-5.  The stenosis is multifactorial with facet arthropathy and epidural lipomatosis.  Patient's case is complicated by morbid obesity.  She is followed in the office by Dr. Basil Dess.  Prior injection we performed was an L4-5 facet joint block which had helped her in the past but really did not help that much this time.  She is having more radicular complaints.  Unfortunately with severe stenosis there is not really a good avenue of treatment for her other than medication management versus surgery.  Surgery is likely problematic given her body habitus.  We will have her follow-up with Dr. Louanne Skye.  Obviously if injections were beneficial for any good length of time they could be repeated.  They tend not to work as well with epidural lipomatosis.  She is maintained on some chronic narcotic medication through her primary care physician Dr. [name].  ROS Otherwise per HPI.  Assessment & Plan: Visit Diagnoses:  1. Lumbar radiculopathy   2. Spondylosis without myelopathy or radiculopathy, lumbar region     Plan: No additional findings.   Meds & Orders:  Meds ordered this encounter  Medications  . methylPREDNISolone acetate (DEPO-MEDROL) injection 80 mg    Orders Placed This Encounter  Procedures  . XR C-ARM NO REPORT  . Epidural Steroid injection    Follow-up: Return if symptoms worsen or fail to improve.   Procedures: No procedures performed  Lumbosacral Transforaminal Epidural Steroid Injection - Sub-Pedicular  Approach with Fluoroscopic Guidance  [name]      Date of Birth: 02-Oct-1965 MRN: [id number] PCP: [name], MD      Visit Date: 03/11/2019   Universal Protocol:    Date/Time: 03/11/2019  Consent Given By: the patient  Position: PRONE  Additional Comments: Vital signs were monitored before and after the procedure. Patient was prepped and draped in the usual sterile fashion. The correct patient, procedure, and site was verified.   Injection Procedure Details:  Procedure Site One Meds Administered:  Meds ordered this encounter  Medications  . methylPREDNISolone acetate (DEPO-MEDROL) injection 80 mg    Laterality: Right  Location/Site:  L4-L5  Needle size: 22 G  Needle type: Spinal  Needle Placement: Transforaminal  Findings:    -Comments: Excellent flow of contrast along the nerve and into the epidural space.  Procedure Details: After squaring off the end-plates to get a true AP view, the C-arm was positioned so that an oblique view of the foramen as noted above was visualized. The target area is just inferior to the "nose of the scotty dog" or sub pedicular. The soft tissues overlying this structure were infiltrated with 2-3 ml. of 1% Lidocaine without Epinephrine.  The spinal needle was inserted toward the target using a "trajectory" view along the fluoroscope beam.  Under AP and lateral visualization, the needle was advanced so it did not puncture dura and was located close the 6 O'Clock position of the pedical in AP tracterory. Biplanar projections were used to confirm position.  Aspiration was confirmed to be negative for CSF and/or blood. A 1-2 ml. volume of Isovue-250 was injected and flow of contrast was noted at each level. Radiographs were obtained for documentation purposes.   After attaining the desired flow of contrast documented above, a 0.5 to 1.0 ml test dose of 0.25% Marcaine was injected into each respective transforaminal space.   The patient was observed for 90 seconds post injection.  After no sensory deficits were reported, and normal lower extremity motor function was noted,   the above injectate was administered so that equal amounts of the injectate were placed at each foramen (level) into the transforaminal epidural space.   Additional Comments:  The patient tolerated the procedure well Dressing: 2 x 2 sterile gauze and Band-Aid    Post-procedure details: Patient was observed during the procedure. Post-procedure instructions were reviewed.  Patient left the clinic in stable condition.      Clinical History: MRI LUMBAR SPINE WITHOUT CONTRAST  TECHNIQUE: Multiplanar, multisequence MR imaging of the lumbar spine was performed. No intravenous contrast was administered.  COMPARISON:  CT Abdomen and Pelvis 02/26/2018. Lumbar MRI 06/13/2017.  FINDINGS: Segmentation: Normal as seen on the comparison CT, which is the same numbering system used on the 2018 MRI.  Alignment: Stable vertebral height and alignment with mild straightening of lumbar lordosis. No spondylolisthesis.  Vertebrae: Chronic degenerative endplate marrow signal changes at L2-L3, sclerotic on the recent CT. Background bone marrow signal is within normal limits. No marrow edema or evidence of acute osseous abnormality. Intact visible sacrum and SI joints.  Conus medullaris and cauda equina: Conus extends to the L1 level. No lower spinal cord or conus signal abnormality.  Paraspinal and other soft tissues: Large body habitus. Stable visible abdominal viscera. Negative visualized posterior paraspinal soft tissues.  Disc levels:  T10-T11: Disc space loss with circumferential disc bulge. Mild posterior element hypertrophy. Mild spinal stenosis and left T10 foraminal stenosis. Possible mild associated spinal cord mass effect, but no definite cord signal abnormality.  T11-T12: Broad-based posterior disc bulge or  protrusion. Borderline to mild spinal stenosis.  T12-L1: Progressed broad-based posterior disc bulging since 2018. Mild facet hypertrophy and epidural lipomatosis. New mild spinal stenosis. No mass effect on the conus.  L1-L2: Progressed epidural lipomatosis. Chronic circumferential disc bulging which may also be mildly increased. Stable mild facet hypertrophy. Increased spinal stenosis, now mild to moderate. Mild left L1 foraminal stenosis is stable.  L2-L3: Chronic severe disc space loss. Chronic circumferential disc osteophyte complex. Interval stable epidural lipomatosis and mild to moderate facet hypertrophy. Stable mild to moderate spinal stenosis. Stable mild right L2 foraminal stenosis.  L3-L4: Chronic circumferential but mostly far lateral disc bulging and moderate facet and ligament flavum hypertrophy. Stable epidural lipomatosis and mild to moderate spinal stenosis. Stable mild right L3 foraminal stenosis.  L4-L5: Chronic broad-based posterior disc bulge or protrusion and moderate to severe facet hypertrophy is stable. Chronic degenerative facet joint fluid. Chronic epidural lipomatosis. Stable severe spinal stenosis (series 5, image 23). No significant foraminal stenosis.  L5-S1: Chronic broad-based posterior disc bulge or protrusion appears stable along with moderate bilateral facet hypertrophy. Stable epidural lipomatosis and mild spinal stenosis.  IMPRESSION: 1. Progressed spinal stenosis since 2018 at T12-L1 and L1-L2, mild to moderate, and related to increased disc bulging and epidural lipomatosis. 2. Stable severe multifactorial spinal stenosis at L4-L5, and moderate spinal stenosis at L2-L3 and L3-L4. 3. Mild lower thoracic spinal stenosis with up to mild spinal cord mass effect at T10-T11, but  no lower spinal cord or conus signal abnormality. 4.  No acute osseous abnormality.   Electronically Signed   By: Genevie Ann M.D.   On: 04/16/2018 15:43      Objective:  VS:  HT:    WT:   BMI:     BP:135/80  HR:(!) 101bpm  TEMP: ( )  RESP:97 % Physical Exam  Ortho Exam Imaging: No results found.

## 2019-03-24 ENCOUNTER — Ambulatory Visit: Payer: 59 | Admitting: Surgery

## 2019-03-25 ENCOUNTER — Ambulatory Visit: Payer: Self-pay

## 2019-03-25 ENCOUNTER — Other Ambulatory Visit: Payer: Self-pay

## 2019-03-25 ENCOUNTER — Encounter: Payer: Self-pay | Admitting: Surgery

## 2019-03-25 ENCOUNTER — Ambulatory Visit (INDEPENDENT_AMBULATORY_CARE_PROVIDER_SITE_OTHER): Payer: 59 | Admitting: Surgery

## 2019-03-25 DIAGNOSIS — M1711 Unilateral primary osteoarthritis, right knee: Secondary | ICD-10-CM | POA: Diagnosis not present

## 2019-03-25 DIAGNOSIS — M25561 Pain in right knee: Secondary | ICD-10-CM

## 2019-03-25 MED ORDER — BUPIVACAINE HCL 0.25 % IJ SOLN
6.0000 mL | INTRAMUSCULAR | Status: AC | PRN
  Administered 2019-03-25: 15:00:00 6 mL via INTRA_ARTICULAR

## 2019-03-25 MED ORDER — METHYLPREDNISOLONE ACETATE 40 MG/ML IJ SUSP
80.0000 mg | INTRAMUSCULAR | Status: AC | PRN
  Administered 2019-03-25: 80 mg via INTRA_ARTICULAR

## 2019-03-25 MED ORDER — LIDOCAINE HCL 1 % IJ SOLN
3.0000 mL | INTRAMUSCULAR | Status: AC | PRN
  Administered 2019-03-25: 3 mL

## 2019-03-25 NOTE — Progress Notes (Signed)
Office Visit Note   [name]           Date of Birth: 10-05-1965           MRN: [id number] Visit Date: 03/25/2019              Requested by: [name], MD [place],  [place] [id number] PCP: [name], MD   Assessment & Plan: Visit Diagnoses:  1. Unilateral primary osteoarthritis, right knee   2. Arthritis of right knee   3. Acute pain of right knee     Plan: In hopes of giving patient some relief of her knee pain offered injection.  After patient sent right knee was prepped with Betadine and intra-articular Marcaine/Depo-Medrol injection was performed.  After sitting for a few minutes patient did report some improvement of her medial compartment pain with Marcaine in place.  She continues have some soreness around the pes bursa.  Follow-up in 2 weeks for recheck.  If pes bursa continues to be a problem I may consider injection there.  Patient will also let us know when she is wanting to proceed with scheduling carpal tunnel release and tennis elbow injection.  Follow-Up Instructions: Return in about 2 weeks (around 04/08/2019) for with .   Orders:  Orders Placed This Encounter  Procedures  . XR KNEE 3 VIEW RIGHT   No orders of the defined types were placed in this encounter.     Procedures: Large Joint Inj: R knee on 03/25/2019 3:02 PM Indications: pain Details: 25 G 1.5 in needle, anteromedial approach Medications: 3 mL lidocaine 1 %; 6 mL bupivacaine 0.25 %; 80 mg methylPREDNISolone acetate 40 MG/ML Consent was given by the patient. Patient was prepped and draped in the usual sterile fashion.       Clinical Data: No additional findings.   Subjective: Chief Complaint  Patient presents with  . Right Knee - Pain    HPI 53 year old black female comes in with new complaint today of right knee pain.  States that the pain started about 2 weeks ago.  Denies injury or previous problems before onset.  Localizes pain to the  medial joint line.  No mechanical symptoms or feeling of instability.  She takes Dilaudid prescribed by primary care physician.  I last seen patient for left carpal tunnel syndrome and right tennis elbow and we had talked about scheduling left carpal tunnel release with Marcaine/Depo-Medrol right tennis elbow injection but she is wanting to hold off on that for now. Review of Systems No current cardiac pulmonary GI GU issues  Objective: Vital Signs: LMP 08/17/2012   Physical Exam Pulmonary:     Effort: No respiratory distress.  Musculoskeletal:     Comments: Exam right knee she has good range of motion.  Positive patellofemoral crepitus.  Moderate to markedly tender at the medial joint line.  Also tender over the pes bursa.  Calf nontender.  Neurovascular intact.  Neurological:     General: No focal deficit present.     Mental Status: She is alert.     Ortho Exam  Specialty Comments:  No specialty comments available.  Imaging: No results found.   PMFS History: Patient Active Problem List   Diagnosis Date Noted  . Spinal stenosis of lumbar region with neurogenic claudication 08/20/2018  . 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 Primary osteoarthritis of right knee 02/27/2017  . Lateral epicondylitis of right elbow 11/11/2016  . Muscle cramps 01/15/2016  .  Bilateral leg edema 01/08/2016  . Radiculopathy 12/20/2015  . Hyperglycemia 12/21/2014  . Mastodynia, female 11/30/2014  . S/P excision of fibroadenoma of breast 11/30/2014  . Angioedema of lips 04/07/2014  . Fibroadenoma of right breast 03/07/2014  . Pelvic pain in female 06/19/2012  . Menorrhagia 06/11/2012  . SUI (stress urinary incontinence, female) 06/11/2012  . HTN (hypertension) 06/11/2012  . IBS (irritable bowel syndrome) - diarrhea predominant 03/17/2012  . MICROSCOPIC HEMATURIA 11/30/2010  . BREAST PAIN, RIGHT 11/30/2010  . BREAST MASS, RIGHT 01/12/2010  . HYPERCHOLESTEROLEMIA 12/07/2007  . CIGARETTE  SMOKER 12/07/2007  . DEGENERATIVE JOINT DISEASE 12/07/2007  . Low back pain with sciatica 12/07/2007  . HEADACHE 12/07/2007  . Obesity 08/03/2007  . ANXIETY 08/03/2007   Past Medical History:  Diagnosis Date  . Anxiety   . Back pain with radiation   . Breast mass, right   . Complication of anesthesia 04/07/14   Allergic reaction to Lisinopril immediately following surgery  . DJD (degenerative joint disease)   . Fibromyalgia   . Groin abscess   . Headache(784.0)   . History of IBS   . Hypercholesterolemia   . IBS (irritable bowel syndrome)   . Lactose intolerance   . Mild hypertension   . Obesity   . Tobacco use disorder   . Umbilical hernia    Symptomatic    Family History  Problem Relation Age of Onset  . Diabetes Father   . Diabetes Mother   . Prostate cancer Paternal Grandfather   . Breast cancer Maternal Aunt 46    Past Surgical History:  Procedure Laterality Date  . ABDOMINAL HYSTERECTOMY    . ANTERIOR CERVICAL DECOMP/DISCECTOMY FUSION N/A 12/20/2015   Procedure: ANTERIOR CERVICAL DECOMPRESSION FUSION CERVICAL 4-5, CERVICAL 5-6, CERVICAL 6-7 WITH INSTRUMENTATION AND ALLOGRAFT;  Surgeon: Phylliss Bob, MD;  Location: [facility];  Service: Orthopedics;  Laterality: N/A;  Anterior cervical decompression fusion, cervical 4-5, cervical 5-6, cervical 6-7 with instrumentation and allograft  . BACK SURGERY    . BILATERAL SALPINGECTOMY  09/03/2012   Procedure: BILATERAL SALPINGECTOMY;  Surgeon: [name], MD;  Location: [facility];  Service: Gynecology;  Laterality: Bilateral;  . BREAST BIOPSY Right 04/07/2014   Procedure: REMOVAL RIGHT BREAST MASS WITH WIRE LOCALIZATION;  Surgeon: [name], MD;  Location: [facility];  Service: General;  Laterality: Right;  . BREAST EXCISIONAL BIOPSY Left    x2  . BREAST LUMPECTOMY     x2  . COLONOSCOPY W/ BIOPSIES  04/24/2012   per Dr. Carlean Purl, clear, repeat in 10 yrs   . disectomy    . ESOPHAGOGASTRODUODENOSCOPY    . FINGER SURGERY      Right index-excision of mass   . FOOT SURGERY Right    Bone Spurs  . LAPAROSCOPIC HYSTERECTOMY  09/03/2012   Procedure: HYSTERECTOMY TOTAL LAPAROSCOPIC;  Surgeon: [name], MD;  Location: [facility];  Service: Gynecology;  Laterality: N/A;  . LUMBAR [place]    . TUBAL LIGATION    . UMBILICAL HERNIA REPAIR N/A 05/01/2018   Procedure: UMBILICAL HERNIA REPAIR;  Surgeon: Judeth Horn, MD;  Location: [facility];  Service: General;  Laterality: N/A;   Social History   Occupational History  . Occupation: Optometrist  Tobacco Use  . Smoking status: Former Smoker    Years: 33.00    Types: Cigarettes  . Smokeless tobacco: Never Used  . Tobacco comment: quit March 2017  Substance and Sexual Activity  . Alcohol use: Yes  Alcohol/week: 0.0 standard drinks    Comment: occ  . Drug use: No    Types: Amphetamines  . Sexual activity: Yes    Birth control/protection: Surgical    Comment: tubal ligation

## 2019-04-29 ENCOUNTER — Encounter: Payer: Self-pay | Admitting: Surgery

## 2019-04-29 ENCOUNTER — Ambulatory Visit (INDEPENDENT_AMBULATORY_CARE_PROVIDER_SITE_OTHER): Payer: 59 | Admitting: Surgery

## 2019-04-29 VITALS — BP 177/94 | HR 97 | Ht 64.0 in | Wt 270.0 lb

## 2019-04-29 DIAGNOSIS — M7711 Lateral epicondylitis, right elbow: Secondary | ICD-10-CM

## 2019-04-29 DIAGNOSIS — G5602 Carpal tunnel syndrome, left upper limb: Secondary | ICD-10-CM

## 2019-04-29 NOTE — Progress Notes (Signed)
53 year old by female history of left carpal tunnel syndrome and right elbow lateral condyle-itis comes in for preop evaluation.  States that symptoms unchanged from previous visit.  She is wanting to proceed with left carpal tunnel release and right tennis elbow Marcaine/Depo-Medrol injection under anesthesia as scheduled.  Today history and physical performed.  All questions answered and patient wishes to proceed.

## 2019-05-03 ENCOUNTER — Encounter (HOSPITAL_BASED_OUTPATIENT_CLINIC_OR_DEPARTMENT_OTHER): Payer: Self-pay | Admitting: *Deleted

## 2019-05-03 ENCOUNTER — Other Ambulatory Visit: Payer: Self-pay

## 2019-05-04 ENCOUNTER — Other Ambulatory Visit (HOSPITAL_COMMUNITY)
Admission: RE | Admit: 2019-05-04 | Discharge: 2019-05-04 | Disposition: A | Discharge status: Home / Self Care | Payer: 59 | Source: Ambulatory Visit | Attending: Specialist | Admitting: Specialist

## 2019-05-04 ENCOUNTER — Encounter (HOSPITAL_BASED_OUTPATIENT_CLINIC_OR_DEPARTMENT_OTHER)
Admission: RE | Admit: 2019-05-04 | Discharge: 2019-05-04 | Disposition: A | Discharge status: Home / Self Care | Payer: 59 | Source: Ambulatory Visit | Attending: Specialist | Admitting: Specialist

## 2019-05-04 DIAGNOSIS — G5602 Carpal tunnel syndrome, left upper limb: Secondary | ICD-10-CM | POA: Diagnosis not present

## 2019-05-04 DIAGNOSIS — Z20828 Contact with and (suspected) exposure to other viral communicable diseases: Secondary | ICD-10-CM | POA: Insufficient documentation

## 2019-05-04 DIAGNOSIS — R9431 Abnormal electrocardiogram [ECG] [EKG]: Secondary | ICD-10-CM | POA: Insufficient documentation

## 2019-05-04 DIAGNOSIS — Z01818 Encounter for other preprocedural examination: Secondary | ICD-10-CM | POA: Insufficient documentation

## 2019-05-04 LAB — BASIC METABOLIC PANEL
Anion gap: 12 (ref 5–15)
BUN: 8 mg/dL (ref 6–20)
CO2: 23 mmol/L (ref 22–32)
Calcium: 9 mg/dL (ref 8.9–10.3)
Chloride: 105 mmol/L (ref 98–111)
Creatinine, Ser: 0.95 mg/dL (ref 0.44–1.00)
GFR calc Af Amer: >60 mL/min (ref >60)
GFR calc non Af Amer: >60 mL/min (ref >60)
Glucose, Bld: 110 mg/dL — ABNORMAL HIGH (ref 70–99)
Potassium: 4.4 mmol/L (ref 3.5–5.1)
Sodium: 140 mmol/L (ref 135–145)

## 2019-05-04 LAB — SARS CORONAVIRUS 2 (TAT 6-24 HRS): SARS Coronavirus 2: NEGATIVE

## 2019-05-04 NOTE — Progress Notes (Addendum)
EKG reviewed and Anesthesia consult per Dr. Royce Macadamia, will proceed with surgery as scheduled.

## 2019-05-06 NOTE — Progress Notes (Signed)
Message sent to PA, Roxy Manns with Louanne Skye office for clarification on pre op orders. EKG and BMP already completed per anesthesia, questioning CBC, CMP and PCR screen are needed.

## 2019-05-07 ENCOUNTER — Encounter (HOSPITAL_BASED_OUTPATIENT_CLINIC_OR_DEPARTMENT_OTHER)
Admission: RE | Disposition: A | Discharge status: Home / Self Care | Payer: Self-pay | Source: Home / Self Care | Attending: Specialist

## 2019-05-07 ENCOUNTER — Other Ambulatory Visit: Payer: Self-pay

## 2019-05-07 ENCOUNTER — Encounter (HOSPITAL_BASED_OUTPATIENT_CLINIC_OR_DEPARTMENT_OTHER): Payer: Self-pay | Admitting: Anesthesiology

## 2019-05-07 ENCOUNTER — Ambulatory Visit (HOSPITAL_BASED_OUTPATIENT_CLINIC_OR_DEPARTMENT_OTHER)
Admission: RE | Admit: 2019-05-07 | Discharge: 2019-05-07 | Disposition: A | Discharge status: Home / Self Care | Payer: 59 | Attending: Specialist | Admitting: Specialist

## 2019-05-07 DIAGNOSIS — Z87891 Personal history of nicotine dependence: Secondary | ICD-10-CM | POA: Diagnosis not present

## 2019-05-07 DIAGNOSIS — M7711 Lateral epicondylitis, right elbow: Secondary | ICD-10-CM | POA: Diagnosis not present

## 2019-05-07 DIAGNOSIS — G5602 Carpal tunnel syndrome, left upper limb: Secondary | ICD-10-CM | POA: Diagnosis not present

## 2019-05-07 DIAGNOSIS — K219 Gastro-esophageal reflux disease without esophagitis: Secondary | ICD-10-CM | POA: Insufficient documentation

## 2019-05-07 DIAGNOSIS — Z5309 Procedure and treatment not carried out because of other contraindication: Secondary | ICD-10-CM | POA: Diagnosis not present

## 2019-05-07 DIAGNOSIS — Z6841 Body Mass Index (BMI) 40.0 and over, adult: Secondary | ICD-10-CM | POA: Diagnosis not present

## 2019-05-07 DIAGNOSIS — I10 Essential (primary) hypertension: Secondary | ICD-10-CM | POA: Diagnosis not present

## 2019-05-07 HISTORY — DX: Other chronic pain: G89.29

## 2019-05-07 HISTORY — DX: Opioid use, unspecified, uncomplicated: F11.90

## 2019-05-07 SURGERY — CANCELLED PROCEDURE
Anesthesia: Monitor Anesthesia Care

## 2019-05-07 MED ORDER — SCOPOLAMINE 1 MG/3DAYS TD PT72: 1.0000 | MEDICATED_PATCH | Freq: Once | TRANSDERMAL | Status: DC

## 2019-05-07 MED ORDER — CHLORHEXIDINE GLUCONATE 4 % EX LIQD: 60.0000 mL | Freq: Once | CUTANEOUS | Status: DC

## 2019-05-07 MED ORDER — ONDANSETRON HCL 4 MG/2ML IJ SOLN
INTRAMUSCULAR | Status: AC
  Filled 2019-05-07: qty 2

## 2019-05-07 MED ORDER — LIDOCAINE 2% (20 MG/ML) 5 ML SYRINGE
INTRAMUSCULAR | Status: AC
  Filled 2019-05-07: qty 5

## 2019-05-07 MED ORDER — FENTANYL CITRATE (PF) 100 MCG/2ML IJ SOLN: 50.0000 ug | INTRAMUSCULAR | Status: DC | PRN

## 2019-05-07 MED ORDER — VANCOMYCIN HCL IN DEXTROSE 500-5 MG/100ML-% IV SOLN
INTRAVENOUS | Status: AC
  Filled 2019-05-07: qty 100

## 2019-05-07 MED ORDER — LACTATED RINGERS IV SOLN
INTRAVENOUS | Status: DC
  Administered 2019-05-07 – 2019-05-11 (×2): via INTRAVENOUS

## 2019-05-07 MED ORDER — MIDAZOLAM HCL 2 MG/2ML IJ SOLN
INTRAMUSCULAR | Status: AC
  Filled 2019-05-07: qty 2

## 2019-05-07 MED ORDER — FENTANYL CITRATE (PF) 100 MCG/2ML IJ SOLN
INTRAMUSCULAR | Status: AC
  Filled 2019-05-07: qty 2

## 2019-05-07 MED ORDER — VANCOMYCIN HCL IN DEXTROSE 1-5 GM/200ML-% IV SOLN
1000.0000 mg | INTRAVENOUS | Status: DC
  Administered 2019-05-11: 1000 mg via INTRAVENOUS

## 2019-05-07 MED ORDER — MIDAZOLAM HCL 2 MG/2ML IJ SOLN: 1.0000 mg | INTRAMUSCULAR | Status: DC | PRN

## 2019-05-07 MED ORDER — PROPOFOL 10 MG/ML IV BOLUS
INTRAVENOUS | Status: AC
  Filled 2019-05-07: qty 20

## 2019-05-07 MED ORDER — VANCOMYCIN HCL IN DEXTROSE 1-5 GM/200ML-% IV SOLN
INTRAVENOUS | Status: AC
  Filled 2019-05-07: qty 200

## 2019-05-07 NOTE — Interval H&P Note (Signed)
History and Physical Interval Note:  05/07/2019 1:51 PM  Dana [name]  has presented today for surgery, with the diagnosis of left carpal tunnel syndrome, right tennis elbow.  The various methods of treatment have been discussed with the patient and family. After consideration of risks, benefits and other options for treatment, the patient has consented to  Procedure(s): LEFT CARPAL TUNNEL RELEASE (Left) RIGHT TENNIS ELBOW INJECTION UNDER ANESTHESIA (Right) as a surgical intervention.  The patient's history has been reviewed, patient examined, no change in status, stable for surgery.  I have reviewed the patient's chart and labs.  Questions were answered to the patient's satisfaction.     Basil Dess

## 2019-05-07 NOTE — Anesthesia Preprocedure Evaluation (Addendum)
Anesthesia Evaluation  Patient identified by MRN, date of birth, ID band Patient awake    Reviewed: Allergy & Precautions, NPO status , Patient's Chart, lab work & pertinent test results  Airway Mallampati: II  TM Distance: >3 FB Neck ROM: Full    Dental  (+) Dental Advisory Given, Teeth Intact   Pulmonary former smoker,    breath sounds clear to auscultation       Cardiovascular hypertension, Pt. on medications  Rhythm:Regular Rate:Normal     Neuro/Psych  Headaches, PSYCHIATRIC DISORDERS Anxiety  Neuromuscular disease    GI/Hepatic Neg liver ROS, GERD  Medicated and Controlled,  Endo/Other  Morbid obesity  Renal/GU negative Renal ROS     Musculoskeletal  (+) Arthritis , Fibromyalgia -  Abdominal (+) + obese,   Peds  Hematology negative hematology ROS (+)   Anesthesia Other Findings   Reproductive/Obstetrics                           Lab Results  Component Value Date   WBC 13.1 (H) 02/14/2019   HGB 13.2 02/14/2019   HCT 41.3 02/14/2019   MCV 95.6 02/14/2019   PLT 250 02/14/2019   Lab Results  Component Value Date   CREATININE 0.95 05/04/2019   BUN 8 05/04/2019   NA 140 05/04/2019   K 4.4 05/04/2019   CL 105 05/04/2019   CO2 23 05/04/2019    Anesthesia Physical  Anesthesia Plan  ASA: IV  Anesthesia Plan: General   Post-op Pain Management:    Induction: Intravenous  PONV Risk Score and Plan: 3 and Ondansetron and Dexamethasone  Airway Management Planned: LMA  Additional Equipment:   Intra-op Plan:   Post-operative Plan: Extubation in OR  Informed Consent: I have reviewed the patients History and Physical, chart, labs and discussed the procedure including the risks, benefits and alternatives for the proposed anesthesia with the patient or authorized representative who has indicated his/her understanding and acceptance.     Dental advisory given and Dental  Advisory Given  Plan Discussed with: CRNA  Anesthesia Plan Comments:     Anesthesia Quick Evaluation

## 2019-05-07 NOTE — Progress Notes (Signed)
Patient ID: Dana [name], female   DOB: 02-15-1966, 53 y.o.   MRN: [id number] This patient seen in the preop holding area she is obese and no vein is accessible for consideration of regional anesthesia. A local anesthesia is a consideration but due to the size of her Arm she is likely to have considerable discomfort with a tourniquet and local wrist anesthesia with the possiblility of a  Venous tourniquet affect at lower more normal tourniquet pressures. Her size dictates that a general anesthesia carries  Increased risk of complication with post anesthesia respiratory Depression. If a local anesthesia is not successful and there is tourniquet pain then a general anesthesia would be necessary and this procedure would require an increased risk of respiratory depression.  I discussed the risks with Dana [name] and I have recommended that her case be rescheduled to be done at the main OR at [facility] as a first case next week. The plan would be for a planned or scheduled general anesthesia so that the Risks of surgery are understood and she would not risk a  General anesthesia that is unplanned. With a general anesthesia the carpal tunnel release may be done with a more reliable chance of decreased intraop pain and more predictable pain control. Surgery is cancelled and I spoke with Kandice Hams at my office to rescedule for next week.

## 2019-05-07 NOTE — H&P (View-Only) (Signed)
Patient ID: Dana [name], female   DOB: Jan 25, 1966, 53 y.o.   MRN: [id number] This patient seen in the preop holding area she is obese and no vein is accessible for consideration of regional anesthesia. A local anesthesia is a consideration but due to the size of her Arm she is likely to have considerable discomfort with a tourniquet and local wrist anesthesia with the possiblility of a  Venous tourniquet affect at lower more normal tourniquet pressures. Her size dictates that a general anesthesia carries  Increased risk of complication with post anesthesia respiratory Depression. If a local anesthesia is not successful and there is tourniquet pain then a general anesthesia would be necessary and this procedure would require an increased risk of respiratory depression.  I discussed the risks with Mrs. Elrod and I have recommended that her case be rescheduled to be done at the main OR at [facility] as a first case next week. The plan would be for a planned or scheduled general anesthesia so that the Risks of surgery are understood and she would not risk a  General anesthesia that is unplanned. With a general anesthesia the carpal tunnel release may be done with a more reliable chance of decreased intraop pain and more predictable pain control. Surgery is cancelled and I spoke with Kandice Hams at my office to rescedule for next week.

## 2019-05-07 NOTE — H&P (Signed)
Dana [name] is an 53 y.o. female.   Chief Complaint: left carpal tunnel syndrome, right tennis elbow.   HPI: 53 year old by female history of left carpal tunnel syndrome and right elbow lateral condylitis comes in for preop evaluation.  States that symptoms unchanged from previous visit.  She is wanting to proceed with left carpal tunnel release and right tennis elbow Marcaine/Depo-Medrol injection under anesthesia as scheduled.   Past Medical History:  Diagnosis Date  . Anxiety   . Back pain with radiation   . Breast mass, right   . Chronic pain   . Chronic, continuous use of opioids   . Complication of anesthesia 04/07/14   Allergic reaction to Lisinopril immediately following surgery  . DJD (degenerative joint disease)   . Fibromyalgia   . Groin abscess   . Headache(784.0)   . History of IBS   . Hypercholesterolemia   . IBS (irritable bowel syndrome)   . Lactose intolerance   . Mild hypertension   . Obesity   . Tobacco use disorder   . Umbilical hernia    Symptomatic    Past Surgical History:  Procedure Laterality Date  . ABDOMINAL HYSTERECTOMY    . ANTERIOR CERVICAL DECOMP/DISCECTOMY FUSION N/A 12/20/2015   Procedure: ANTERIOR CERVICAL DECOMPRESSION FUSION CERVICAL 4-5, CERVICAL 5-6, CERVICAL 6-7 WITH INSTRUMENTATION AND ALLOGRAFT;  Surgeon: Phylliss Bob, MD;  Location: [facility];  Service: Orthopedics;  Laterality: N/A;  Anterior cervical decompression fusion, cervical 4-5, cervical 5-6, cervical 6-7 with instrumentation and allograft  . BACK SURGERY    . BILATERAL SALPINGECTOMY  09/03/2012   Procedure: BILATERAL SALPINGECTOMY;  Surgeon: [name], MD;  Location: [facility];  Service: Gynecology;  Laterality: Bilateral;  . BREAST BIOPSY Right 04/07/2014   Procedure: REMOVAL RIGHT BREAST MASS WITH WIRE LOCALIZATION;  Surgeon: [name], MD;  Location: [facility];  Service: General;  Laterality: Right;  . BREAST EXCISIONAL BIOPSY Left    x2  . BREAST LUMPECTOMY     x2   . COLONOSCOPY W/ BIOPSIES  04/24/2012   per Dr. Carlean Purl, clear, repeat in 10 yrs   . disectomy    . ESOPHAGOGASTRODUODENOSCOPY    . FINGER SURGERY     Right index-excision of mass   . FOOT SURGERY Right    Bone Spurs  . LAPAROSCOPIC HYSTERECTOMY  09/03/2012   Procedure: HYSTERECTOMY TOTAL LAPAROSCOPIC;  Surgeon: [name], MD;  Location: [facility];  Service: Gynecology;  Laterality: N/A;  . LUMBAR [place]    . TUBAL LIGATION    . UMBILICAL HERNIA REPAIR N/A 05/01/2018   Procedure: UMBILICAL HERNIA REPAIR;  Surgeon: Judeth Horn, MD;  Location: [facility];  Service: General;  Laterality: N/A;    Family History  Problem Relation Age of Onset  . Diabetes Father   . Diabetes Mother   . Prostate cancer Paternal Grandfather   . Breast cancer Maternal Aunt 46   Social History:  reports that she has quit smoking. Her smoking use included cigarettes. She quit after 33.00 years of use. She has never used smokeless tobacco. She reports previous alcohol use. She reports current drug use.  Allergies:  Allergies  Allergen Reactions  . Lisinopril Anaphylaxis    was hospitalized for 3 days  . Codeine Hives  . Hydrocodone Hives  . Penicillins Itching and Swelling    PATIENT HAS HAD A PCN REACTION WITH IMMEDIATE RASH, FACIAL/TONGUE/THROAT SWELLING, SOB, OR LIGHTHEADEDNESS WITH HYPOTENSION:  #  #  YES  #  #  Has patient had a PCN reaction causing severe rash involving mucus membranes or skin necrosis: No Has patient had a PCN reaction that required hospitalization No Has patient had a PCN reaction occurring within the last 10 years: No If all of the above answers are "NO", then may proceed with Cephalosporin use.     No medications prior to admission.    No results found for this or any previous visit (from the past 48 hour(s)). No results found.  Review of Systems  Constitutional: Negative.   HENT: Negative.   Respiratory: Negative.   Cardiovascular: Negative.   Genitourinary:  Negative.   Neurological: Positive for tingling.  Psychiatric/Behavioral: Negative.     Height 5\' 4"  (1.626 m), weight 133.2 kg, last menstrual period 08/17/2012. Physical Exam  Constitutional: She is oriented to person, place, and time. No distress.  HENT:  Head: Normocephalic and atraumatic.  Eyes: Pupils are equal, round, and reactive to light. EOM are normal.  Neck: Normal range of motion.  Cardiovascular: Normal rate.  Respiratory: Effort normal. No respiratory distress.  GI: She exhibits no distension. There is no abdominal tenderness.  Musculoskeletal:        General: Tenderness present.  Neurological: She is alert and oriented to person, place, and time.  Skin: Skin is warm and dry.  Psychiatric: She has a normal mood and affect.     Assessment/Plan Left carpal tunnel syndrome and right tennis elbow.  Will proceed with left carpal tunnel release and right tennis elbow Marcaine/Depo-Medrol injection under anesthesia as scheduled.    Benjiman Core, PA-C 05/07/2019, 10:40 AM

## 2019-05-10 ENCOUNTER — Inpatient Hospital Stay (HOSPITAL_COMMUNITY): Admission: RE | Admit: 2019-05-10 | Payer: Medicare Other | Source: Ambulatory Visit

## 2019-05-10 ENCOUNTER — Encounter (HOSPITAL_COMMUNITY): Payer: Self-pay | Admitting: *Deleted

## 2019-05-10 DIAGNOSIS — M797 Fibromyalgia: Secondary | ICD-10-CM | POA: Diagnosis not present

## 2019-05-10 DIAGNOSIS — I10 Essential (primary) hypertension: Secondary | ICD-10-CM | POA: Diagnosis not present

## 2019-05-10 DIAGNOSIS — M7711 Lateral epicondylitis, right elbow: Secondary | ICD-10-CM | POA: Diagnosis present

## 2019-05-10 DIAGNOSIS — G5602 Carpal tunnel syndrome, left upper limb: Secondary | ICD-10-CM | POA: Diagnosis not present

## 2019-05-10 DIAGNOSIS — Z880 Allergy status to penicillin: Secondary | ICD-10-CM | POA: Diagnosis not present

## 2019-05-10 DIAGNOSIS — Z885 Allergy status to narcotic agent status: Secondary | ICD-10-CM | POA: Diagnosis not present

## 2019-05-10 DIAGNOSIS — E669 Obesity, unspecified: Secondary | ICD-10-CM | POA: Diagnosis not present

## 2019-05-10 DIAGNOSIS — Z888 Allergy status to other drugs, medicaments and biological substances status: Secondary | ICD-10-CM | POA: Diagnosis not present

## 2019-05-10 DIAGNOSIS — Z20828 Contact with and (suspected) exposure to other viral communicable diseases: Secondary | ICD-10-CM | POA: Diagnosis not present

## 2019-05-10 DIAGNOSIS — Z87891 Personal history of nicotine dependence: Secondary | ICD-10-CM | POA: Diagnosis not present

## 2019-05-10 NOTE — Progress Notes (Signed)
Denies chest pain or shob. Denies cardiology visit.

## 2019-05-11 ENCOUNTER — Ambulatory Visit (HOSPITAL_COMMUNITY): Payer: 59 | Admitting: Anesthesiology

## 2019-05-11 ENCOUNTER — Encounter (HOSPITAL_COMMUNITY): Payer: Self-pay | Admitting: Anesthesiology

## 2019-05-11 ENCOUNTER — Other Ambulatory Visit: Payer: Self-pay

## 2019-05-11 ENCOUNTER — Ambulatory Visit (HOSPITAL_COMMUNITY)
Admission: RE | Admit: 2019-05-11 | Discharge: 2019-05-11 | Disposition: A | Discharge status: Home / Self Care | Payer: 59 | Attending: Specialist | Admitting: Specialist

## 2019-05-11 ENCOUNTER — Encounter (HOSPITAL_COMMUNITY)
Admission: RE | Disposition: A | Discharge status: Home / Self Care | Payer: Self-pay | Source: Home / Self Care | Attending: Specialist

## 2019-05-11 DIAGNOSIS — G5602 Carpal tunnel syndrome, left upper limb: Secondary | ICD-10-CM | POA: Diagnosis present

## 2019-05-11 DIAGNOSIS — M7711 Lateral epicondylitis, right elbow: Secondary | ICD-10-CM

## 2019-05-11 DIAGNOSIS — Z885 Allergy status to narcotic agent status: Secondary | ICD-10-CM | POA: Insufficient documentation

## 2019-05-11 DIAGNOSIS — Z20828 Contact with and (suspected) exposure to other viral communicable diseases: Secondary | ICD-10-CM | POA: Insufficient documentation

## 2019-05-11 DIAGNOSIS — M797 Fibromyalgia: Secondary | ICD-10-CM | POA: Insufficient documentation

## 2019-05-11 DIAGNOSIS — Z87891 Personal history of nicotine dependence: Secondary | ICD-10-CM | POA: Insufficient documentation

## 2019-05-11 DIAGNOSIS — Z888 Allergy status to other drugs, medicaments and biological substances status: Secondary | ICD-10-CM | POA: Insufficient documentation

## 2019-05-11 DIAGNOSIS — I10 Essential (primary) hypertension: Secondary | ICD-10-CM | POA: Insufficient documentation

## 2019-05-11 DIAGNOSIS — Z880 Allergy status to penicillin: Secondary | ICD-10-CM | POA: Insufficient documentation

## 2019-05-11 DIAGNOSIS — E669 Obesity, unspecified: Secondary | ICD-10-CM | POA: Insufficient documentation

## 2019-05-11 HISTORY — PX: CARPAL TUNNEL RELEASE: SHX101

## 2019-05-11 LAB — SARS CORONAVIRUS 2 (TAT 6-24 HRS): SARS Coronavirus 2: NEGATIVE

## 2019-05-11 LAB — CBC
HCT: 40.4 % (ref 36.0–46.0)
Hemoglobin: 13 g/dL (ref 12.0–15.0)
MCH: 31.2 pg (ref 26.0–34.0)
MCHC: 32.2 g/dL (ref 30.0–36.0)
MCV: 96.9 fL (ref 80.0–100.0)
Platelets: 277 10*3/uL (ref 150–400)
RBC: 4.17 MIL/uL (ref 3.87–5.11)
RDW: 15.7 % — ABNORMAL HIGH (ref 11.5–15.5)
WBC: 8.5 10*3/uL (ref 4.0–10.5)
nRBC: 0.2 % (ref 0.0–0.2)

## 2019-05-11 SURGERY — CARPAL TUNNEL RELEASE
Anesthesia: Monitor Anesthesia Care | Laterality: Left

## 2019-05-11 MED ORDER — PROPOFOL 10 MG/ML IV BOLUS
INTRAVENOUS | Status: AC
  Filled 2019-05-11: qty 20

## 2019-05-11 MED ORDER — LIDOCAINE HCL (CARDIAC) PF 100 MG/5ML IV SOSY
PREFILLED_SYRINGE | INTRAVENOUS | Status: DC | PRN
  Administered 2019-05-11: 40 mg via INTRAVENOUS

## 2019-05-11 MED ORDER — FENTANYL CITRATE (PF) 100 MCG/2ML IJ SOLN
INTRAMUSCULAR | Status: AC
  Filled 2019-05-11: qty 2

## 2019-05-11 MED ORDER — ONDANSETRON HCL 4 MG/2ML IJ SOLN
INTRAMUSCULAR | Status: DC | PRN
  Administered 2019-05-11: 4 mg via INTRAVENOUS

## 2019-05-11 MED ORDER — DEXAMETHASONE SODIUM PHOSPHATE 10 MG/ML IJ SOLN
INTRAMUSCULAR | Status: DC | PRN
  Administered 2019-05-11: 10 mg via INTRAVENOUS

## 2019-05-11 MED ORDER — MIDAZOLAM HCL 5 MG/5ML IJ SOLN
INTRAMUSCULAR | Status: DC | PRN
  Administered 2019-05-11: 2 mg via INTRAVENOUS

## 2019-05-11 MED ORDER — PROPOFOL 10 MG/ML IV BOLUS
INTRAVENOUS | Status: DC | PRN
  Administered 2019-05-11: 50 mg via INTRAVENOUS
  Administered 2019-05-11: 100 mg via INTRAVENOUS
  Administered 2019-05-11: 200 mg via INTRAVENOUS
  Administered 2019-05-11 (×2): 50 mg via INTRAVENOUS

## 2019-05-11 MED ORDER — LIDOCAINE HCL 0.5 % IJ SOLN
INTRAMUSCULAR | Status: DC | PRN
  Administered 2019-05-11: 5 mL
  Administered 2019-05-11: 3 mL

## 2019-05-11 MED ORDER — FENTANYL CITRATE (PF) 100 MCG/2ML IJ SOLN
INTRAMUSCULAR | Status: DC | PRN
  Administered 2019-05-11 (×5): 50 ug via INTRAVENOUS

## 2019-05-11 MED ORDER — 0.9 % SODIUM CHLORIDE (POUR BTL) OPTIME
TOPICAL | Status: DC | PRN
  Administered 2019-05-11: 1000 mL

## 2019-05-11 MED ORDER — METHYLPREDNISOLONE ACETATE 80 MG/ML IJ SUSP
INTRAMUSCULAR | Status: AC
  Filled 2019-05-11: qty 1

## 2019-05-11 MED ORDER — FENTANYL CITRATE (PF) 100 MCG/2ML IJ SOLN
25.0000 ug | INTRAMUSCULAR | Status: DC | PRN
  Administered 2019-05-11: 10:00:00 50 ug via INTRAVENOUS

## 2019-05-11 MED ORDER — PROMETHAZINE HCL 25 MG/ML IJ SOLN: 6.2500 mg | INTRAMUSCULAR | Status: DC | PRN

## 2019-05-11 MED ORDER — MIDAZOLAM HCL 2 MG/2ML IJ SOLN
INTRAMUSCULAR | Status: AC
  Filled 2019-05-11: qty 2

## 2019-05-11 MED ORDER — FENTANYL CITRATE (PF) 250 MCG/5ML IJ SOLN
INTRAMUSCULAR | Status: AC
  Filled 2019-05-11: qty 5

## 2019-05-11 MED ORDER — METHYLPREDNISOLONE ACETATE 80 MG/ML IJ SUSP
INTRAMUSCULAR | Status: DC | PRN
  Administered 2019-05-11: 80 mg

## 2019-05-11 MED ORDER — VANCOMYCIN HCL IN DEXTROSE 1-5 GM/200ML-% IV SOLN
INTRAVENOUS | Status: AC
  Filled 2019-05-11: qty 200

## 2019-05-11 MED ORDER — BUPIVACAINE HCL (PF) 0.25 % IJ SOLN
INTRAMUSCULAR | Status: AC
  Filled 2019-05-11: qty 30

## 2019-05-11 MED ORDER — BUPIVACAINE HCL (PF) 0.25 % IJ SOLN
INTRAMUSCULAR | Status: DC | PRN
  Administered 2019-05-11: 4 mL

## 2019-05-11 MED ORDER — MEPERIDINE HCL 25 MG/ML IJ SOLN: 6.2500 mg | INTRAMUSCULAR | Status: DC | PRN

## 2019-05-11 SURGICAL SUPPLY — 53 items
BNDG CMPR 9X4 STRL LF SNTH (GAUZE/BANDAGES/DRESSINGS) ×1
BNDG ELASTIC 3X5.8 VLCR STR LF (GAUZE/BANDAGES/DRESSINGS) ×3 IMPLANT
BNDG ELASTIC 4X5.8 VLCR STR LF (GAUZE/BANDAGES/DRESSINGS) ×2 IMPLANT
BNDG ESMARK 4X9 LF (GAUZE/BANDAGES/DRESSINGS) ×3 IMPLANT
BNDG GAUZE ELAST 4 BULKY (GAUZE/BANDAGES/DRESSINGS) ×1 IMPLANT
CLOSURE WOUND 1/2 X4 (GAUZE/BANDAGES/DRESSINGS) ×1
COVER SURGICAL LIGHT HANDLE (MISCELLANEOUS) ×1 IMPLANT
COVER WAND RF STERILE (DRAPES) ×3 IMPLANT
CUFF TOURN SGL QUICK 18X4 (TOURNIQUET CUFF) ×3 IMPLANT
CUFF TOURN SGL QUICK 24 (TOURNIQUET CUFF) ×3
CUFF TRNQT CYL 24X4X16.5-23 (TOURNIQUET CUFF) IMPLANT
DRAPE U-SHAPE 47X51 STRL (DRAPES) ×3 IMPLANT
DRSG XEROFORM 1X8 (GAUZE/BANDAGES/DRESSINGS) ×2 IMPLANT
DURAPREP 26ML APPLICATOR (WOUND CARE) ×3 IMPLANT
ELECT REM PT RETURN 9FT ADLT (ELECTROSURGICAL) ×3
ELECTRODE REM PT RTRN 9FT ADLT (ELECTROSURGICAL) ×1 IMPLANT
GAUZE SPONGE 4X4 12PLY STRL (GAUZE/BANDAGES/DRESSINGS) ×3 IMPLANT
GAUZE XEROFORM 1X8 LF (GAUZE/BANDAGES/DRESSINGS) ×3 IMPLANT
GLOVE BIOGEL PI IND STRL 8 (GLOVE) ×1 IMPLANT
GLOVE BIOGEL PI INDICATOR 8 (GLOVE) ×2
GLOVE ECLIPSE 9.0 STRL (GLOVE) ×3 IMPLANT
GLOVE ORTHO TXT STRL SZ7.5 (GLOVE) ×3 IMPLANT
GLOVE SURG 8.5 LATEX PF (GLOVE) ×3 IMPLANT
GOWN STRL REUS W/ TWL LRG LVL3 (GOWN DISPOSABLE) ×1 IMPLANT
GOWN STRL REUS W/TWL 2XL LVL3 (GOWN DISPOSABLE) ×6 IMPLANT
GOWN STRL REUS W/TWL LRG LVL3 (GOWN DISPOSABLE) ×3
KIT BASIN OR (CUSTOM PROCEDURE TRAY) ×3 IMPLANT
KIT TURNOVER KIT B (KITS) ×3 IMPLANT
NDL HYPO 25GX1X1/2 BEV (NEEDLE) ×1 IMPLANT
NDL HYPO 25X1 1.5 SAFETY (NEEDLE) IMPLANT
NEEDLE HYPO 25GX1X1/2 BEV (NEEDLE) ×3 IMPLANT
NEEDLE HYPO 25X1 1.5 SAFETY (NEEDLE) ×3 IMPLANT
NS IRRIG 1000ML POUR BTL (IV SOLUTION) ×3 IMPLANT
PACK ORTHO EXTREMITY (CUSTOM PROCEDURE TRAY) ×3 IMPLANT
PAD ABD 8X10 STRL (GAUZE/BANDAGES/DRESSINGS) ×2 IMPLANT
PAD ARMBOARD 7.5X6 YLW CONV (MISCELLANEOUS) ×6 IMPLANT
PAD CAST 3X4 CTTN HI CHSV (CAST SUPPLIES) IMPLANT
PAD CAST 4YDX4 CTTN HI CHSV (CAST SUPPLIES) ×1 IMPLANT
PADDING CAST COTTON 3X4 STRL (CAST SUPPLIES) ×3
PADDING CAST COTTON 4X4 STRL (CAST SUPPLIES) ×6
SPLINT FIBERGLASS 3X12 (CAST SUPPLIES) ×2 IMPLANT
SPONGE LAP 4X18 RFD (DISPOSABLE) IMPLANT
STRIP CLOSURE SKIN 1/2X4 (GAUZE/BANDAGES/DRESSINGS) ×2 IMPLANT
SUT ETHILON 4 0 PS 2 18 (SUTURE) ×2 IMPLANT
SUT VIC AB 3-0 PS2 18 (SUTURE) ×3
SUT VIC AB 3-0 PS2 18XBRD (SUTURE) IMPLANT
SYR CONTROL 10ML LL (SYRINGE) ×3 IMPLANT
TOWEL GREEN STERILE (TOWEL DISPOSABLE) ×3 IMPLANT
TOWEL GREEN STERILE FF (TOWEL DISPOSABLE) ×3 IMPLANT
TUBE CONNECTING 12'X1/4 (SUCTIONS) ×1
TUBE CONNECTING 12X1/4 (SUCTIONS) ×2 IMPLANT
UNDERPAD 30X30 (UNDERPADS AND DIAPERS) ×3 IMPLANT
WATER STERILE IRR 1000ML POUR (IV SOLUTION) ×3 IMPLANT

## 2019-05-11 NOTE — Interval H&P Note (Signed)
History and Physical Interval Note:  05/11/2019 7:35 AM  Dana [name]  has presented today for surgery, with the diagnosis of left carpal tunnel syndrome, right tennis elbow.  The various methods of treatment have been discussed with the patient and family. After consideration of risks, benefits and other options for treatment, the patient has consented to  Procedure(s): LEFT CARPAL TUNNEL RELEASE, RIGHT TENNIS ELBOW MARCAINE/DEPO MEDROL INJECTION UNDER ANESTHESIA (Left) as a surgical intervention.  The patient's history has been reviewed, patient examined, no change in status, stable for surgery.  I have reviewed the patient's chart and labs.  Questions were answered to the patient's satisfaction.     Basil Dess

## 2019-05-11 NOTE — Op Note (Signed)
05/11/2019  8:38 AM  PATIENT:  Dana [name]  53 y.o. female  MRN: [id number]  PRE-OPERATIVE DIAGNOSIS:  left carpal tunnel syndrome, right tennis elbow  POST-OPERATIVE DIAGNOSIS:  left carpal tunnel syndrome, right tennis elbow  PROCEDURE:  Procedure(s): LEFT CARPAL TUNNEL RELEASE, RIGHT TENNIS ELBOW MARCAINE/DEPO MEDROL INJECTION UNDER ANESTHESIA   OPERATIVE REPORT     SURGEON:  Jessy Oto, MD      ASSISTANT:  Benjiman Core, PA-C  (Present throughout the entire procedure and necessary for completion of procedure in a timely manner)      ANESTHESIA:  General with ELMA, Supplemented with local Marcaine 0.5 % 34VQ     COMPLICATIONS:  None.      TOURNIQUET TIME: 11 minutes at  290 mmHg   PROCEDURE: The patient was met in the holding area, and the appropriate left wrist and right elbow identified and marked.  The patient was then transported to OR and was placed on the operative table in a supine position. The patient was then placed under general anesthesia without difficulty. The patient received appropriate preoperative antibiotic prophylaxis vancomycin for a PCN allergy.     The right  upper extremity elbow was then prepped using sterile conditions and draped using sterile technique. A 1.5 inch needle then used to inject 4cc of marcaine 0.25% and 1 cc of depomedrol 70mg per cc. Bandaid applied to the right elbow. Attention then turned to the left upper extremity.The left  upper extremity was then prepped using sterile conditions and draped using sterile technique. Time-out procedure was called and correct. The skin overlying the left volar distal wrist creases infiltrated with marcaine 0.25% total 8 cc. Using loope magnification and head lamp a 1.5 inch incision curved at the wrist crease with 15 blade scalpel.  Incision through skin and subcutaneous tissue to the volar forearm fascia and  transverse carpal ligament. Fascia then carefully lifed and incised with Stevens  scissors inline with the fourth digit. The skin and subcutaneous tissue retracted and the volar fascia divided under direct vision from distal to proximal. A freer elevator then carefully placed between the median nerve and the transverse carpal ligament protecting the  median nerve as the transverse carpal ligament was divided with a 15 blade scalpel in line with the fourth digit. Retracting the distal skin and subcutaneous tissues distally under direct visualization the remaining portions of the transverse carpal ligament were divided with tenotomy scissors again in line with the fourth digit. The palmar fascia was then divided until the traversing superficial palmar arch was identified and preserved intact.  The motor branch of the median nerve was carefully examined and identified intact. Tourniquet was then released. Bleeding controlled with bipolar electrocautery. The incision was then irrigated with copious amounts of irrigant solution, No active bleeding was present. The incision closed with a single layer skin closure of 4-0 nylon horizontal mattress sutures. Dry dressing of adaptic, 4x4s held in place with sterile webril.  A well padded volar splint applied with ace wrap.  The patient reactivated and returned to the PACU in good condition.  All instruments and sponge counts were correct.   JBenjiman Core PA-C perform the duties of assistant surgeon he performed careful retraction of soft tissues assisted in addition the patient had removal on the OR table is present beginning the case the case and performed closure of the incision from the deep fascia layer to the skin and application of dressing.       JBasil Dess  05/11/2019, 8:38 AM

## 2019-05-11 NOTE — Transfer of Care (Signed)
Immediate Anesthesia Transfer of Care Note  [name]  Procedure(s) Performed: LEFT CARPAL TUNNEL RELEASE, RIGHT TENNIS ELBOW MARCAINE/DEPO MEDROL INJECTION UNDER ANESTHESIA (Left )  Patient Location: PACU  Anesthesia Type:General  Level of Consciousness: awake, alert  and oriented  Airway & Oxygen Therapy: Patient Spontanous Breathing and Patient connected to nasal cannula oxygen  Post-op Assessment: Report given to RN, Post -op Vital signs reviewed and stable and Patient moving all extremities X 4  Post vital signs: Reviewed and stable  Last Vitals:  Vitals Value Taken Time  BP    Temp    Pulse 91 05/11/19 0900  Resp 15 05/11/19 0900  SpO2 97 % 05/11/19 0900  Vitals shown include unvalidated device data.  Last Pain:  Vitals:   05/11/19 0632  PainSc: 0-No pain      Patients Stated Pain Goal: 3 ([id number])  Complications: No apparent anesthesia complications

## 2019-05-11 NOTE — Anesthesia Procedure Notes (Signed)
Procedure Name: LMA Insertion Date/Time: 05/11/2019 7:44 AM Performed by: Neldon Newport, CRNA Pre-anesthesia Checklist: Timeout performed, Patient being monitored, Suction available, Emergency Drugs available and Patient identified Patient Re-evaluated:Patient Re-evaluated prior to induction Oxygen Delivery Method: Circle system utilized Preoxygenation: Pre-oxygenation with 100% oxygen Induction Type: IV induction Ventilation: Mask ventilation without difficulty LMA: LMA inserted LMA Size: 4.0 Tube type: Oral Number of attempts: 1 Placement Confirmation: breath sounds checked- equal and bilateral,  positive ETCO2 and ETT inserted through vocal cords under direct vision Tube secured with: Tape Dental Injury: Teeth and Oropharynx as per pre-operative assessment

## 2019-05-11 NOTE — Discharge Instructions (Addendum)
Keep dressing dry. Elevated wrist above heart. Apply ice to palm side of wrist two hours on and one half hour off for 48 hours. May Apply ice at night and go to sleep with out changing. Be sure to keep ice off fingers to prevent frost bite.  Return to office in two weeks for removal of sutures   Open Carpal Tunnel Release, Care After This sheet gives you information about how to care for yourself after your procedure. Your health care provider may also give you more specific instructions. If you have problems or questions, contact your health care provider. What can I expect after the procedure? After the procedure, it is common to have:  Wrist stiffness.  Bruising. Follow these instructions at home: Bathing  Do not take baths, swim, or use a hot tub until your health care provider approves. Ask your health care provider if you may take showers.  Keep your bandage (dressing) dry until your health care provider says it can be removed. If you have a splint or brace:  Wear the splint or brace as told by your health care provider. You may need to wear it for 2-3 weeks. Remove it only as told by your health care provider.  Loosen the splint or brace if your fingers tingle, become numb, or turn cold and blue.  Keep the splint or brace clean.  If the splint or brace is not waterproof: ? Do not let it get wet. ? Cover it with a watertight covering when you take a bath or a shower. Incision care   Follow instructions from your health care provider about how to take care of your incision. Make sure you: ? Wash your hands with soap and water before you change your dressing. If soap and water are not available, use hand sanitizer. ? Change your dressing as told by your health care provider. ? Leave stitches (sutures), skin glue, or adhesive strips in place. These skin closures may need to stay in place for 2 weeks or longer. If adhesive strip edges start to loosen and curl up, you may  trim the loose edges. Do not remove adhesive strips completely unless your health care provider tells you to do that.  Check your incision area every day for signs of infection. Check for: ? Redness, swelling, or pain. ? Fluid or blood. ? Warmth. ? Pus or a bad smell. Managing pain, stiffness, and swelling   If directed, put ice on the affected area. ? If you have a removable splint or brace, remove it as told by your health care provider. ? Put ice in a plastic bag. ? Place a towel between your skin and the bag. ? Leave the ice on for 20 minutes, 2-3 times a day.  Move your fingers often to avoid stiffness and to lessen swelling.  Raise (elevate) your wrist above the level of your heart while you are sitting or lying down. Activity  Do not drive until your health care provider approves.  Do not drive or use heavy machinery while taking prescription pain medicine.  Return to your normal activities as told by your health care provider. Avoid activities that cause pain.  If physical therapy was prescribed, do exercises as told by your therapist. Physical therapy can help you heal faster and regain movement. General instructions  Take over-the-counter and prescription medicines only as told by your health care provider.  If you are taking prescription pain medicine, take actions to prevent or treat constipation.  Your health care provider may recommend that you: ? Drink enough fluid to keep your urine pale yellow. ? Eat foods that are high in fiber, such as fresh fruits and vegetables, whole grains, and beans. ? Limit foods that are high in fat and processed sugars, such as fried or sweet foods. ? Take an over-the-counter or prescription medicine for constipation.  Do not use any products that contain nicotine or tobacco, such as cigarettes and e-cigarettes. If you need help quitting, ask your health care provider.  Keep all follow-up visits as told by your health care provider  and physical therapist. This is important. Contact a health care provider if:  You have redness or swelling around your incision.  You have fluid or blood coming from your incision.  Your incision feels warm to the touch.  You have pus or a bad smell coming from your incision.  You have a fever.  You have chills.  You have pain that does not get better with medicine.  Your carpal tunnel symptoms do not go away after 2 months.  Your carpal tunnel symptoms go away and then come back. Get help right away if:  You have pain or numbness that is getting worse.  Your fingers or fingertips become very pale or bluish in color.  You are not able to move your fingers. Summary  It is common to have wrist stiffness and bruising after a carpal tunnel release.  Icing and raising (elevating) your wrist may help to lessen swelling and pain.  Call your health care provider if you have a fever or notice any signs of infection in your incision area. This information is not intended to replace advice given to you by your health care provider. Make sure you discuss any questions you have with your health care provider. Document Released: 03/22/2005 Document Revised: 08/15/2017 Document Reviewed: 05/12/2017 Elsevier Patient Education  [place]. left wrist.

## 2019-05-11 NOTE — Brief Op Note (Signed)
05/11/2019  8:35 AM  PATIENT:  Dana [name]  53 y.o. female  PRE-OPERATIVE DIAGNOSIS:  left carpal tunnel syndrome, right tennis elbow  POST-OPERATIVE DIAGNOSIS:  left carpal tunnel syndrome, right tennis elbow  PROCEDURE:  Procedure(s): LEFT CARPAL TUNNEL RELEASE, RIGHT TENNIS ELBOW MARCAINE/DEPO MEDROL INJECTION UNDER ANESTHESIA (Left)  SURGEON:  Surgeon(s) and Role:    * Jessy Oto, MD - Primary  PHYSICIAN ASSISTANT: Esaw Grandchild  ANESTHESIA:   local and general, Dr. Glennon Mac.  EBL: 5cc  BLOOD ADMINISTERED:none  DRAINS: none   LOCAL MEDICATIONS USED:  MARCAINE 0.25% 8cc left wrist, 4 cc right elbow Amount: 12 ml  SPECIMEN:  No Specimen  DISPOSITION OF SPECIMEN:  N/A  COUNTS:  YES  TOURNIQUET:   Total Tourniquet Time Documented: Upper Arm (Left) - 12 minutes Total: Upper Arm (Left) - 12 minutes 290 mmHg  DICTATION: .Dragon Dictation  PLAN OF CARE: Discharge to home after PACU  PATIENT DISPOSITION:  PACU - hemodynamically stable.   Delay start of Pharmacological VTE agent (>24hrs) due to surgical blood loss or risk of bleeding: yes

## 2019-05-11 NOTE — Anesthesia Postprocedure Evaluation (Signed)
Anesthesia Post Note  [name]  Procedure(s) Performed: LEFT CARPAL TUNNEL RELEASE, RIGHT TENNIS ELBOW MARCAINE/DEPO MEDROL INJECTION UNDER ANESTHESIA (Left )     Patient location during evaluation: PACU Anesthesia Type: General Level of consciousness: awake and alert, patient cooperative and oriented Pain control: pain improving. Vital Signs Assessment: post-procedure vital signs reviewed and stable Respiratory status: spontaneous breathing, nonlabored ventilation, respiratory function stable and patient connected to nasal cannula oxygen Cardiovascular status: blood pressure returned to baseline and stable Postop Assessment: no apparent nausea or vomiting Anesthetic complications: no    Last Vitals:  Vitals:   05/11/19 0925 05/11/19 0933  BP:  (!) 175/87  Pulse: 85 87  Resp: 16 20  Temp:    SpO2: 100% 100%    Last Pain:  Vitals:   05/11/19 0933  PainSc: 9                  Sephora Boyar,E. Hjalmer Iovino

## 2019-05-12 ENCOUNTER — Encounter (HOSPITAL_COMMUNITY): Payer: Self-pay | Admitting: Specialist

## 2019-05-18 ENCOUNTER — Other Ambulatory Visit: Payer: Self-pay

## 2019-05-18 ENCOUNTER — Ambulatory Visit (INDEPENDENT_AMBULATORY_CARE_PROVIDER_SITE_OTHER): Payer: 59 | Admitting: Family Medicine

## 2019-05-18 ENCOUNTER — Ambulatory Visit: Payer: Self-pay | Admitting: *Deleted

## 2019-05-18 ENCOUNTER — Encounter: Payer: Self-pay | Admitting: Family Medicine

## 2019-05-18 VITALS — BP 140/80 | HR 109 | Temp 98.7°F | Wt 298.7 lb

## 2019-05-18 DIAGNOSIS — R600 Localized edema: Secondary | ICD-10-CM

## 2019-05-18 DIAGNOSIS — G4700 Insomnia, unspecified: Secondary | ICD-10-CM | POA: Diagnosis not present

## 2019-05-18 MED ORDER — FUROSEMIDE 40 MG PO TABS: ORAL_TABLET | ORAL | 5 refills | Status: DC

## 2019-05-18 MED ORDER — TEMAZEPAM 30 MG PO CAPS: 30.0000 mg | ORAL_CAPSULE | Freq: Every evening | ORAL | 2 refills | Status: DC | PRN

## 2019-05-18 NOTE — Telephone Encounter (Signed)
   Reason for Disposition . [1] MODERATE leg swelling (e.g., swelling extends up to knees) AND [2] new onset or worsening  Answer Assessment - Initial Assessment Questions 1. ONSET: "When did the swelling start?" (e.g., minutes, hours, days)     Bilateral foot and leg swelling for 2 weeks  2. LOCATION: "What part of the leg is swollen?"  "Are both legs swollen or just one leg?"     Both feet and legs up to knee  3. SEVERITY: "How bad is the swelling?" (e.g., localized; mild, moderate, severe)  - Localized - small area of swelling localized to one leg  - MILD pedal edema - swelling limited to foot and ankle, pitting edema < 1/4 inch (6 mm) deep, rest and elevation eliminate most or all swelling  - MODERATE edema - swelling of lower leg to knee, pitting edema > 1/4 inch (6 mm) deep, rest and elevation only partially reduce swelling  - SEVERE edema - swelling extends above knee, facial or hand swelling present      Moderate 4. REDNESS: "Does the swelling look red or infected?"    No redness  5. PAIN: "Is the swelling painful to touch?" If so, ask: "How painful is it?"   (Scale 1-10; mild, moderate or severe)     10/10 per patient 6. FEVER: "Do you have a fever?" If so, ask: "What is it, how was it measured, and when did it start?"      No fever  7. CAUSE: "What do you think is causing the leg swelling?"    Unsure  8. MEDICAL HISTORY: "Do you have a history of heart failure, kidney disease, liver failure, or cancer?"     No history of the above  9. RECURRENT SYMPTOM: "Have you had leg swelling before?" If so, ask: "When was the last time?" "What happened that time?"     Has had feet and leg swelling in the past, but not to this extent  10. OTHER SYMPTOMS: "Do you have any other symptoms?" (e.g., chest pain, difficulty breathing)       No chest pain or difficulty breathing  11. PREGNANCY: "Is there any chance you are pregnant?" "When was your last menstrual period?"       N/A  Protocols  used: LEG SWELLING AND EDEMA-A-AH  Patient states she has been having bilateral lower leg and foot swelling for the past 2 weeks that is worsening.  States her feet are so swollen that it is painful to walk since Sunday 05/16/2019, and it has been waking her up at night.  States she recently had left carpal tunnel surgery on 05/11/2019.  She started experiencing the swelling about 1 week prior to her surgery.  Patient takes lasix 40 mg twice daily and states she has not missed any doses.  She denies any fever, redness or red streaks on either leg or foot.  Also denies chest pain and shortness of breath.  Advised patient that she needs an appointment with her PCP today.  Transferred her call to PCP office and she is scheduled to see Dr. Sarajane Jews today at 3:45 pm.  Advised patient to proceed to ER for treatment if she develops any chest pain or shortness of breath prior to her appointment this afternoon.  Patient expressed understanding and is agreeable with plan.

## 2019-05-18 NOTE — Telephone Encounter (Signed)
Patient has an appointment with Dr. Sarajane Jews. Nothing further needed at this time

## 2019-05-18 NOTE — Progress Notes (Signed)
   Subjective:    Patient ID: Dana [name], female    DOB: March 31, 1966, 53 y.o.   MRN: [id number]  HPI Here for several issues. First she has had some swelling in the feet and legs for a long time, but over the past month or so this has gotten worse. Now it is actually painful. She denies any SOB. No recent changes in diet or medication. She tries to avoid too much salt in her diet. She is taking Lasix 40 mg bid. On 05-04-19 her creatinine was normal at 0.95. The other issue is her problems sleeping. She has trouble both falling asleep and staying asleep. OTC melatonin did not help. Of note she had a carpal tunnel release on the left wrist per Dr. Louanne Skye on 05-11-19.  Review of Systems  Constitutional: Negative.   Respiratory: Negative.   Cardiovascular: Positive for leg swelling. Negative for chest pain and palpitations.  Gastrointestinal: Negative.   Genitourinary: Negative.   Psychiatric/Behavioral: Positive for sleep disturbance. Negative for dysphoric mood. The patient is not nervous/anxious.        Objective:   Physical Exam Constitutional:      Appearance: Normal appearance.  Cardiovascular:     Rate and Rhythm: Normal rate and regular rhythm.     Pulses: Normal pulses.     Heart sounds: Normal heart sounds.  Pulmonary:     Effort: Pulmonary effort is normal. No respiratory distress.     Breath sounds: Normal breath sounds. No stridor. No wheezing, rhonchi or rales.  Musculoskeletal:     Comments: 3+ edema in both feet and lower legs   Lymphadenopathy:     Cervical: No cervical adenopathy.  Neurological:     General: No focal deficit present.     Mental Status: She is alert and oriented to person, place, and time.           Assessment & Plan:  For the leg edema, she will increase the Lasix to 2 tabs (80 mg total) in the mornings and stay on 40 mg in the evenings. Keep the legs elevated and wear compression stockings. We will check a BMET, BNP, and thyroid panel today.  Set up an ECHO soon. Also for sleep she will try Temazepam 30 mg qhs.  [name], MD

## 2019-05-19 LAB — BASIC METABOLIC PANEL
BUN: 16 mg/dL (ref 6–23)
CO2: 31 mEq/L (ref 19–32)
Calcium: 9 mg/dL (ref 8.4–10.5)
Chloride: 97 mEq/L (ref 96–112)
Creatinine, Ser: 0.91 mg/dL (ref 0.40–1.20)
GFR: 78.04 mL/min (ref >60.00)
Glucose, Bld: 90 mg/dL (ref 70–99)
Potassium: 3.9 mEq/L (ref 3.5–5.1)
Sodium: 138 mEq/L (ref 135–145)

## 2019-05-19 LAB — BRAIN NATRIURETIC PEPTIDE: Pro B Natriuretic peptide (BNP): 71 pg/mL (ref 0.0–100.0)

## 2019-05-19 LAB — T4, FREE: Free T4: 0.8 ng/dL (ref 0.60–1.60)

## 2019-05-19 LAB — TSH: TSH: 2.8 u[IU]/mL (ref 0.35–4.50)

## 2019-05-19 LAB — T3, FREE: T3, Free: 2.7 pg/mL (ref 2.3–4.2)

## 2019-05-26 ENCOUNTER — Encounter: Payer: Self-pay | Admitting: Surgery

## 2019-05-26 ENCOUNTER — Ambulatory Visit (INDEPENDENT_AMBULATORY_CARE_PROVIDER_SITE_OTHER): Payer: 59 | Admitting: Surgery

## 2019-05-26 DIAGNOSIS — Z9889 Other specified postprocedural states: Secondary | ICD-10-CM

## 2019-05-27 ENCOUNTER — Ambulatory Visit (HOSPITAL_COMMUNITY): Payer: 59 | Attending: Internal Medicine

## 2019-05-27 ENCOUNTER — Other Ambulatory Visit: Payer: Self-pay

## 2019-05-27 DIAGNOSIS — R600 Localized edema: Secondary | ICD-10-CM | POA: Diagnosis present

## 2019-05-28 NOTE — Progress Notes (Signed)
53 year old black female who is two-week status post left carpal tunnel release and right tennis elbow Marcaine/Depo-Medrol injection under anesthesia returns.  States that she is doing well.  She had some itching around her incision so she took her dressing off.  She has been taking showers with wrapping her hand.  Right elbow is pain-free.  Exam Pleasant black female alert and oriented in no acute distress.  Left hand wound looks good.  Sutures removed and Steri-Strips applied.  No drainage or signs of infection.  Plan Advised patient to do gentle finger and wrist range of motion exercises.  Avoid pushing pulling lifting with left hand.  Advised that this can take several weeks to get better.  She wanted me to speak with her husband in regards to the do's and don'ts of her left hand and she put him on face time with me today.  Follow-up in 2 weeks for recheck.  Return sooner if needed.

## 2019-06-03 ENCOUNTER — Encounter: Payer: Self-pay | Admitting: Family Medicine

## 2019-06-03 ENCOUNTER — Telehealth (INDEPENDENT_AMBULATORY_CARE_PROVIDER_SITE_OTHER): Payer: 59 | Admitting: Family Medicine

## 2019-06-03 ENCOUNTER — Other Ambulatory Visit: Payer: Self-pay

## 2019-06-03 DIAGNOSIS — F1190 Opioid use, unspecified, uncomplicated: Secondary | ICD-10-CM

## 2019-06-03 DIAGNOSIS — M48062 Spinal stenosis, lumbar region with neurogenic claudication: Secondary | ICD-10-CM | POA: Diagnosis not present

## 2019-06-03 MED ORDER — HYDROMORPHONE HCL 4 MG PO TABS: 4.0000 mg | ORAL_TABLET | Freq: Four times a day (QID) | ORAL | 0 refills | Status: DC | PRN

## 2019-06-03 MED ORDER — HYDROMORPHONE HCL 4 MG PO TABS: 4.0000 mg | ORAL_TABLET | Freq: Four times a day (QID) | ORAL | 0 refills | Status: AC | PRN

## 2019-06-03 NOTE — Progress Notes (Signed)
Virtual Visit via Video Note  I connected with the patient on 06/03/19 at  9:00 AM EDT by a video enabled telemedicine application and verified that I am speaking with the correct person using two identifiers.  Location patient: home Location provider:work or home office Persons participating in the virtual visit: patient, provider  I discussed the limitations of evaluation and management by telemedicine and the availability of in person appointments. The patient expressed understanding and agreed to proceed.   HPI: Here for pain management, she is doing well.  Indication for chronic opioid: low back pain  Medication and dose: Dilaudid 4 mg  # pills per month: 120 Last UDS date: 12-15-17 Opioid Treatment Agreement signed (Y/N): 12-19-17 Opioid Treatment Agreement last reviewed with patient:  06-03-19 [facility] reviewed this encounter (include red flags): Yes    ROS: See pertinent positives and negatives per HPI.  Past Medical History:  Diagnosis Date  . Anxiety   . Back pain with radiation   . Breast mass, right   . Chronic pain   . Chronic, continuous use of opioids   . Complication of anesthesia 04/07/14   Allergic reaction to Lisinopril immediately following surgery  . DJD (degenerative joint disease)   . Fibromyalgia   . Groin abscess   . Headache(784.0)   . History of IBS   . Hypercholesterolemia   . IBS (irritable bowel syndrome)   . Lactose intolerance   . Mild hypertension   . Obesity   . Tobacco use disorder   . Umbilical hernia    Symptomatic    Past Surgical History:  Procedure Laterality Date  . ABDOMINAL HYSTERECTOMY    . ANTERIOR CERVICAL DECOMP/DISCECTOMY FUSION N/A 12/20/2015   Procedure: ANTERIOR CERVICAL DECOMPRESSION FUSION CERVICAL 4-5, CERVICAL 5-6, CERVICAL 6-7 WITH INSTRUMENTATION AND ALLOGRAFT;  Surgeon: Phylliss Bob, MD;  Location: [place];  Service: Orthopedics;  Laterality: N/A;  Anterior cervical decompression fusion, cervical 4-5, cervical 5-6,  cervical 6-7 with instrumentation and allograft  . BACK SURGERY    . BILATERAL SALPINGECTOMY  09/03/2012   Procedure: BILATERAL SALPINGECTOMY;  Surgeon: [name], MD;  Location: [facility];  Service: Gynecology;  Laterality: Bilateral;  . BREAST BIOPSY Right 04/07/2014   Procedure: REMOVAL RIGHT BREAST MASS WITH WIRE LOCALIZATION;  Surgeon: [name], MD;  Location: [facility];  Service: General;  Laterality: Right;  . BREAST EXCISIONAL BIOPSY Left    x2  . BREAST LUMPECTOMY     x2  . CARPAL TUNNEL RELEASE Left 05/11/2019   Procedure: LEFT CARPAL TUNNEL RELEASE, RIGHT TENNIS ELBOW MARCAINE/DEPO MEDROL INJECTION UNDER ANESTHESIA;  Surgeon: Jessy Oto, MD;  Location: [place];  Service: Orthopedics;  Laterality: Left;  . COLONOSCOPY W/ BIOPSIES  04/24/2012   per Dr. Carlean Purl, clear, repeat in 10 yrs   . disectomy    . ESOPHAGOGASTRODUODENOSCOPY    . FINGER SURGERY     Right index-excision of mass   . FOOT SURGERY Right    Bone Spurs  . LAPAROSCOPIC HYSTERECTOMY  09/03/2012   Procedure: HYSTERECTOMY TOTAL LAPAROSCOPIC;  Surgeon: [name], MD;  Location: [facility];  Service: Gynecology;  Laterality: N/A;  . LUMBAR [place]    . TUBAL LIGATION    . UMBILICAL HERNIA REPAIR N/A 05/01/2018   Procedure: UMBILICAL HERNIA REPAIR;  Surgeon: Judeth Horn, MD;  Location: [place] [facility];  Service: General;  Laterality: N/A;    Family History  Problem Relation Age of Onset  . Diabetes Father   . Diabetes  Mother   . Prostate cancer Paternal Grandfather   . Breast cancer Maternal Aunt 46     Current Outpatient Medications:  .  Cholecalciferol 50000 units TABS, Take 1 tablet by mouth 2 (two) times a week., Disp: 16 tablet, Rfl: 0 .  cyclobenzaprine (FLEXERIL) 10 MG tablet, Take 10 mg by mouth once daily at bedtime, Disp: 90 tablet, Rfl: 3 .  dorzolamide-timolol (COSOPT) 22.3-6.8 MG/ML ophthalmic solution, Place 1 drop into both eyes as needed. , Disp: , Rfl:  .  furosemide (LASIX) 40 MG  tablet, Take 2 tablets in the morning and one in the evening, Disp: 90 tablet, Rfl: 5 .  hydrALAZINE (APRESOLINE) 25 MG tablet, Take 1 tablet (25 mg total) by mouth 3 (three) times daily., Disp: 90 tablet, Rfl: 5 .  [START ON 08/03/2019] HYDROmorphone (DILAUDID) 4 MG tablet, Take 1 tablet (4 mg total) by mouth every 6 (six) hours as needed for severe pain., Disp: 120 tablet, Rfl: 0 .  metoprolol tartrate (LOPRESSOR) 50 MG tablet, Take 50 mg by mouth 2 (two) times daily., Disp: , Rfl:  .  ondansetron (ZOFRAN) 8 MG tablet, Take 1 tablet (8 mg total) by mouth every 8 (eight) hours as needed for nausea or vomiting., Disp: 30 tablet, Rfl: 0 .  potassium chloride (KLOR-CON 10) 10 MEQ tablet, Take 1 tablet (10 mEq total) by mouth 2 (two) times daily., Disp: 180 tablet, Rfl: 3 .  temazepam (RESTORIL) 30 MG capsule, Take 1 capsule (30 mg total) by mouth at bedtime as needed for sleep., Disp: 30 capsule, Rfl: 2  EXAM:  VITALS per patient if applicable:  GENERAL: alert, oriented, appears well and in no acute distress  HEENT: atraumatic, conjunttiva clear, no obvious abnormalities on inspection of external nose and ears  NECK: normal movements of the head and neck  LUNGS: on inspection no signs of respiratory distress, breathing rate appears normal, no obvious gross SOB, gasping or wheezing  CV: no obvious cyanosis  MS: moves all visible extremities without noticeable abnormality  PSYCH/NEURO: pleasant and cooperative, no obvious depression or anxiety, speech and thought processing grossly intact  ASSESSMENT AND PLAN: Pain management, meds were refilled.  [name], MD  Discussed the following assessment and plan:  No diagnosis found.     I discussed the assessment and treatment plan with the patient. The patient was provided an opportunity to ask questions and all were answered. The patient agreed with the plan and demonstrated an understanding of the instructions.   The patient was  advised to call back or seek an in-person evaluation if the symptoms worsen or if the condition fails to improve as anticipated.

## 2019-06-09 ENCOUNTER — Other Ambulatory Visit: Payer: Self-pay

## 2019-06-09 ENCOUNTER — Ambulatory Visit (INDEPENDENT_AMBULATORY_CARE_PROVIDER_SITE_OTHER): Payer: 59 | Admitting: Surgery

## 2019-06-09 ENCOUNTER — Encounter: Payer: Self-pay | Admitting: Surgery

## 2019-06-09 VITALS — Ht 64.0 in | Wt 298.0 lb

## 2019-06-09 DIAGNOSIS — Z9889 Other specified postprocedural states: Secondary | ICD-10-CM

## 2019-06-09 NOTE — Progress Notes (Signed)
53 year old black female who is about 4-week status post left carpal tunnel release returns.  She continues to have some soreness and stiffness in her wrist.  She has been working more on finger range of motion exercises and not so much her wrist.  Exam Pleasant black female alert and oriented in no acute distress.  Flexing and extending her fingers well.  She does have some limitation with wrist flexion due to discomfort and stiffness.  Neurovascular intact.  Plan I encouraged patient to do range of motion exercises of her wrist.  Still no aggressive activity with gripping and lifting pushing pulling etc.  Follow-up me in 2 weeks for recheck.  If she seems like she is moving slow with her exercises may consider sending her to hand rehab at that time.  Continue splint but can gradually wean herself out of it.

## 2019-06-16 ENCOUNTER — Telehealth: Payer: Self-pay | Admitting: Physical Medicine and Rehabilitation

## 2019-06-16 NOTE — Telephone Encounter (Signed)
ok 

## 2019-06-17 NOTE — Telephone Encounter (Signed)
Scheduled for 10/22 at 1430 with driver.

## 2019-06-23 ENCOUNTER — Ambulatory Visit: Payer: Medicare Other | Admitting: Surgery

## 2019-07-07 ENCOUNTER — Encounter: Payer: Self-pay | Admitting: Surgery

## 2019-07-07 ENCOUNTER — Ambulatory Visit (INDEPENDENT_AMBULATORY_CARE_PROVIDER_SITE_OTHER): Payer: 59 | Admitting: Surgery

## 2019-07-07 ENCOUNTER — Other Ambulatory Visit: Payer: Self-pay

## 2019-07-07 DIAGNOSIS — Z9889 Other specified postprocedural states: Secondary | ICD-10-CM

## 2019-07-07 NOTE — Progress Notes (Signed)
53 year old female who is about 2 months status post left carpal tunnel release returns.  States that she is doing well and pleased with her surgery result of this point.  She does have some residual hand pain but preop symptoms are much better.  Exam Surgical incision is well-healed.  Moves fingers and wrist well.  Plan Advised patient I think that she is doing great at this point.  Follow-up in 4 weeks for recheck.  If she is doing well at that time she may cancel appointment.  All questions answered.

## 2019-07-08 ENCOUNTER — Encounter: Payer: Self-pay | Admitting: Physical Medicine and Rehabilitation

## 2019-07-08 ENCOUNTER — Ambulatory Visit: Payer: 59 | Admitting: Physical Medicine and Rehabilitation

## 2019-07-08 ENCOUNTER — Ambulatory Visit: Payer: Self-pay

## 2019-07-08 VITALS — BP 140/89 | HR 95

## 2019-07-08 DIAGNOSIS — M48062 Spinal stenosis, lumbar region with neurogenic claudication: Secondary | ICD-10-CM

## 2019-07-08 DIAGNOSIS — M5416 Radiculopathy, lumbar region: Secondary | ICD-10-CM

## 2019-07-08 MED ORDER — BETAMETHASONE SOD PHOS & ACET 6 (3-3) MG/ML IJ SUSP: 12.0000 mg | Freq: Once | INTRAMUSCULAR | Status: DC

## 2019-07-08 NOTE — Progress Notes (Signed)
 .  Numeric Pain Rating Scale and Functional Assessment Average Pain 8   In the last MONTH (on 0-10 scale) has pain interfered with the following?  1. General activity like being  able to carry out your everyday physical activities such as walking, climbing stairs, carrying groceries, or moving a chair?  Rating(7)   +Driver, -BT, -Dye Allergies.  

## 2019-08-04 ENCOUNTER — Ambulatory Visit: Payer: Medicare Other | Admitting: Surgery

## 2019-08-06 NOTE — Progress Notes (Signed)
Dana [name] - 53 y.o. female MRN [id number]  Date of birth: 07/22/1966  Office Visit Note: Visit Date: 07/08/2019 PCP: [name], MD Referred by: [name], MD  Subjective: Chief Complaint  Patient presents with  . Lower Back - Pain  . Right Leg - Pain   HPI:  Dana [name] is a 53 y.o. female who comes in today At the request of Basil Dess, MD for repeat right L4 transforaminal epidural steroid injection.  Patient has known severe stable L4-5 stenosis.  She has some stenosis in the upper lumbar and lower lumbar region as well.  She is having pain down the leg.  Last injection in June seem to help to some degree and started to come back a few weeks ago without any new injury.  She takes 120 tablets of 4 mg of hydromorphone monthly.  This is prescribed by her primary care physician Dr. Sarajane Jews.  Case complicated by morbid obesity.  Continue to follow with Dr. Basil Dess for possible spine surgery.  ROS Otherwise per HPI.  Assessment & Plan: Visit Diagnoses:  1. Lumbar radiculopathy   2. Spinal stenosis of lumbar region with neurogenic claudication     Plan: No additional findings.   Meds & Orders:  Meds ordered this encounter  Medications  . betamethasone acetate-betamethasone sodium phosphate (CELESTONE) injection 12 mg    Orders Placed This Encounter  Procedures  . XR C-ARM NO REPORT  . Epidural Steroid injection    Follow-up: No follow-ups on file.   Procedures: No procedures performed  Lumbosacral Transforaminal Epidural Steroid Injection - Sub-Pedicular Approach with Fluoroscopic Guidance  [name]      Date of Birth: 05/16/1966 MRN: [id number] PCP: [name], MD      Visit Date: 07/08/2019   Universal Protocol:    Date/Time: 07/08/2019  Consent Given By: the patient  Position: PRONE  Additional Comments: Vital signs were monitored before and after the procedure. Patient was prepped and draped in the usual sterile  fashion. The correct patient, procedure, and site was verified.   Injection Procedure Details:  Procedure Site One Meds Administered:  Meds ordered this encounter  Medications  . betamethasone acetate-betamethasone sodium phosphate (CELESTONE) injection 12 mg    Laterality: Right  Location/Site:  L4-L5  Needle size: 22 G  Needle type: Spinal  Needle Placement: Transforaminal  Findings:    -Comments: Excellent flow of contrast along the nerve and into the epidural space.  Procedure Details: After squaring off the end-plates to get a true AP view, the C-arm was positioned so that an oblique view of the foramen as noted above was visualized. The target area is just inferior to the "nose of the scotty dog" or sub pedicular. The soft tissues overlying this structure were infiltrated with 2-3 ml. of 1% Lidocaine without Epinephrine.  The spinal needle was inserted toward the target using a "trajectory" view along the fluoroscope beam.  Under AP and lateral visualization, the needle was advanced so it did not puncture dura and was located close the 6 O'Clock position of the pedical in AP tracterory. Biplanar projections were used to confirm position. Aspiration was confirmed to be negative for CSF and/or blood. A 1-2 ml. volume of Isovue-250 was injected and flow of contrast was noted at each level. Radiographs were obtained for documentation purposes.   After attaining the desired flow of contrast documented above, a 0.5 to 1.0 ml test dose of 0.25% Marcaine was injected  into each respective transforaminal space.  The patient was observed for 90 seconds post injection.  After no sensory deficits were reported, and normal lower extremity motor function was noted,   the above injectate was administered so that equal amounts of the injectate were placed at each foramen (level) into the transforaminal epidural space.   Additional Comments:  The patient tolerated the procedure well  Dressing: 2 x 2 sterile gauze and Band-Aid    Post-procedure details: Patient was observed during the procedure. Post-procedure instructions were reviewed.  Patient left the clinic in stable condition.    Clinical History: MRI LUMBAR SPINE WITHOUT CONTRAST  TECHNIQUE: Multiplanar, multisequence MR imaging of the lumbar spine was performed. No intravenous contrast was administered.  COMPARISON:  CT Abdomen and Pelvis 02/26/2018. Lumbar MRI 06/13/2017.  FINDINGS: Segmentation: Normal as seen on the comparison CT, which is the same numbering system used on the 2018 MRI.  Alignment: Stable vertebral height and alignment with mild straightening of lumbar lordosis. No spondylolisthesis.  Vertebrae: Chronic degenerative endplate marrow signal changes at L2-L3, sclerotic on the recent CT. Background bone marrow signal is within normal limits. No marrow edema or evidence of acute osseous abnormality. Intact visible sacrum and SI joints.  Conus medullaris and cauda equina: Conus extends to the L1 level. No lower spinal cord or conus signal abnormality.  Paraspinal and other soft tissues: Large body habitus. Stable visible abdominal viscera. Negative visualized posterior paraspinal soft tissues.  Disc levels:  T10-T11: Disc space loss with circumferential disc bulge. Mild posterior element hypertrophy. Mild spinal stenosis and left T10 foraminal stenosis. Possible mild associated spinal cord mass effect, but no definite cord signal abnormality.  T11-T12: Broad-based posterior disc bulge or protrusion. Borderline to mild spinal stenosis.  T12-L1: Progressed broad-based posterior disc bulging since 2018. Mild facet hypertrophy and epidural lipomatosis. New mild spinal stenosis. No mass effect on the conus.  L1-L2: Progressed epidural lipomatosis. Chronic circumferential disc bulging which may also be mildly increased. Stable mild facet hypertrophy. Increased  spinal stenosis, now mild to moderate. Mild left L1 foraminal stenosis is stable.  L2-L3: Chronic severe disc space loss. Chronic circumferential disc osteophyte complex. Interval stable epidural lipomatosis and mild to moderate facet hypertrophy. Stable mild to moderate spinal stenosis. Stable mild right L2 foraminal stenosis.  L3-L4: Chronic circumferential but mostly far lateral disc bulging and moderate facet and ligament flavum hypertrophy. Stable epidural lipomatosis and mild to moderate spinal stenosis. Stable mild right L3 foraminal stenosis.  L4-L5: Chronic broad-based posterior disc bulge or protrusion and moderate to severe facet hypertrophy is stable. Chronic degenerative facet joint fluid. Chronic epidural lipomatosis. Stable severe spinal stenosis (series 5, image 23). No significant foraminal stenosis.  L5-S1: Chronic broad-based posterior disc bulge or protrusion appears stable along with moderate bilateral facet hypertrophy. Stable epidural lipomatosis and mild spinal stenosis.  IMPRESSION: 1. Progressed spinal stenosis since 2018 at T12-L1 and L1-L2, mild to moderate, and related to increased disc bulging and epidural lipomatosis. 2. Stable severe multifactorial spinal stenosis at L4-L5, and moderate spinal stenosis at L2-L3 and L3-L4. 3. Mild lower thoracic spinal stenosis with up to mild spinal cord mass effect at T10-T11, but no lower spinal cord or conus signal abnormality. 4.  No acute osseous abnormality.   Electronically Signed   By: Genevie Ann M.D.   On: 04/16/2018 15:43     Objective:  VS:  HT:    WT:   BMI:     BP:140/89  HR:95bpm  TEMP: ( )  RESP:  Physical Exam  Ortho Exam Imaging: No results found.

## 2019-08-06 NOTE — Procedures (Signed)
Lumbosacral Transforaminal Epidural Steroid Injection - Sub-Pedicular Approach with Fluoroscopic Guidance  [name]      Date of Birth: 06/28/1966 MRN: [id number] PCP: [name], MD      Visit Date: 07/08/2019   Universal Protocol:    Date/Time: 07/08/2019  Consent Given By: the patient  Position: PRONE  Additional Comments: Vital signs were monitored before and after the procedure. Patient was prepped and draped in the usual sterile fashion. The correct patient, procedure, and site was verified.   Injection Procedure Details:  Procedure Site One Meds Administered:  Meds ordered this encounter  Medications  . betamethasone acetate-betamethasone sodium phosphate (CELESTONE) injection 12 mg    Laterality: Right  Location/Site:  L4-L5  Needle size: 22 G  Needle type: Spinal  Needle Placement: Transforaminal  Findings:    -Comments: Excellent flow of contrast along the nerve and into the epidural space.  Procedure Details: After squaring off the end-plates to get a true AP view, the C-arm was positioned so that an oblique view of the foramen as noted above was visualized. The target area is just inferior to the "nose of the scotty dog" or sub pedicular. The soft tissues overlying this structure were infiltrated with 2-3 ml. of 1% Lidocaine without Epinephrine.  The spinal needle was inserted toward the target using a "trajectory" view along the fluoroscope beam.  Under AP and lateral visualization, the needle was advanced so it did not puncture dura and was located close the 6 O'Clock position of the pedical in AP tracterory. Biplanar projections were used to confirm position. Aspiration was confirmed to be negative for CSF and/or blood. A 1-2 ml. volume of Isovue-250 was injected and flow of contrast was noted at each level. Radiographs were obtained for documentation purposes.   After attaining the desired flow of contrast documented above, a 0.5 to  1.0 ml test dose of 0.25% Marcaine was injected into each respective transforaminal space.  The patient was observed for 90 seconds post injection.  After no sensory deficits were reported, and normal lower extremity motor function was noted,   the above injectate was administered so that equal amounts of the injectate were placed at each foramen (level) into the transforaminal epidural space.   Additional Comments:  The patient tolerated the procedure well Dressing: 2 x 2 sterile gauze and Band-Aid    Post-procedure details: Patient was observed during the procedure. Post-procedure instructions were reviewed.  Patient left the clinic in stable condition.

## 2019-08-25 ENCOUNTER — Ambulatory Visit (INDEPENDENT_AMBULATORY_CARE_PROVIDER_SITE_OTHER): Payer: 59 | Admitting: Surgery

## 2019-08-25 ENCOUNTER — Other Ambulatory Visit: Payer: Self-pay

## 2019-08-25 ENCOUNTER — Telehealth: Payer: Self-pay | Admitting: Surgery

## 2019-08-25 ENCOUNTER — Encounter: Payer: Self-pay | Admitting: Surgery

## 2019-08-25 ENCOUNTER — Other Ambulatory Visit: Payer: Self-pay | Admitting: Radiology

## 2019-08-25 VITALS — Ht 64.0 in | Wt 298.0 lb

## 2019-08-25 DIAGNOSIS — G8929 Other chronic pain: Secondary | ICD-10-CM

## 2019-08-25 DIAGNOSIS — M1711 Unilateral primary osteoarthritis, right knee: Secondary | ICD-10-CM | POA: Diagnosis not present

## 2019-08-25 DIAGNOSIS — M25561 Pain in right knee: Secondary | ICD-10-CM | POA: Diagnosis not present

## 2019-08-25 MED ORDER — LIDOCAINE HCL 1 % IJ SOLN
3.0000 mL | INTRAMUSCULAR | Status: AC | PRN
  Administered 2019-08-25: 12:00:00 3 mL

## 2019-08-25 MED ORDER — BUPIVACAINE HCL 0.25 % IJ SOLN
6.0000 mL | INTRAMUSCULAR | Status: AC | PRN
  Administered 2019-08-25: 6 mL via INTRA_ARTICULAR

## 2019-08-25 MED ORDER — METHYLPREDNISOLONE ACETATE 40 MG/ML IJ SUSP
80.0000 mg | INTRAMUSCULAR | Status: AC | PRN
  Administered 2019-08-25: 12:00:00 80 mg via INTRA_ARTICULAR

## 2019-08-25 NOTE — Telephone Encounter (Signed)
Pt called in said she was here earlier to see Jeneen Rinks and was given a prescription for a walker but pt is looking to recieve a Rolator walker with a seat. [facility] said they need the prescription to specially say a walker with a seat if not she'll be denied and have to request a new prescription either way. Please fax that new prescription to [phone]  Any questions give the pt a call at  [phone]

## 2019-08-25 NOTE — Telephone Encounter (Signed)
Ok to send in new script for that walker.  If your not able will need to wait until tomorrow.

## 2019-08-25 NOTE — Telephone Encounter (Signed)
[name] please see message below and advise--Thanks

## 2019-08-25 NOTE — Progress Notes (Signed)
Office Visit Note   [name]           Date of Birth: 04/12/1966           MRN: [id number] Visit Date: 08/25/2019              Requested by: [name], MD [name],  [place] [address] PCP: [name], MD   Assessment & Plan: Visit Diagnoses:  1. Arthritis of right knee   2. Chronic pain of right knee     Plan: In hopes of giving patient improvement of her acute on chronic knee pain I did offer to repeat intra-articular Marcaine/Depo-Medrol injection.  After patient sent this was done.  I will have patient follow-up with Dr. Louanne Skye in 5 weeks for recheck and he will discuss possible scheduling of right total knee replacement at that time if he feels that this is indicated.  Patient requested a prescription for a walker and this was given.  Follow-Up Instructions: Return in about 5 weeks (around 09/29/2019) for With Dr. Louanne Skye to discuss possible scheduling of right total knee replacement.   Orders:  Orders Placed This Encounter  Procedures   Large Joint Inj   No orders of the defined types were placed in this encounter.     Procedures: Large Joint Inj: R knee on 08/25/2019 11:41 AM Indications: pain Details: 25 G 1.5 in needle, anteromedial approach Medications: 3 mL lidocaine 1 %; 6 mL bupivacaine 0.25 %; 80 mg methylPREDNISolone acetate 40 MG/ML Outcome: tolerated well, no immediate complications Consent was given by the patient. Patient was prepped and draped in the usual sterile fashion.       Clinical Data: No additional findings.   Subjective: Chief Complaint  Patient presents with   Right Knee - Pain    HPI 53 year old black female history of end-stage DJD right knee and pain returns.  Knee has been more bothersome over the last 3 weeks.  Patient was seen by me March 25, 2019 and I performed intra-articular Marcaine/Depo-Medrol injection at that time.  This did give her fairly good temporary relief.  She understands  at some point will come down her needing definitive treatment with total knee replacement.  She states that she needs to lose weight first.  Pain when she is ambulating and sleeping with her knee flexed. Review of Systems No current cardiac pulmonary GI GU issues  Objective: Vital Signs: Ht 5\' 4"  (1.626 m)    Wt 298 lb (135.2 kg)    LMP 08/17/2012    BMI 51.15 kg/m   Physical Exam HENT:     Head: Normocephalic and atraumatic.  Eyes:     Extraocular Movements: Extraocular movements intact.     Pupils: Pupils are equal, round, and reactive to light.  Pulmonary:     Effort: Pulmonary effort is normal. No respiratory distress.  Musculoskeletal:     Comments: Gait is antalgic.  Right knee positive patellofemoral crepitus.  Medial greater than lateral joint line tenderness.  Does have some swelling without large effusion.  Calf nontender.  Neurovascular intact.  Neurological:     General: No focal deficit present.     Mental Status: She is alert and oriented to person, place, and time.  Psychiatric:        Mood and Affect: Mood normal.     Ortho Exam  Specialty Comments:  No specialty comments available.  Imaging: No results found.   PMFS History: Patient Active Problem  List   Diagnosis Date Noted   Carpal tunnel syndrome, left upper limb 05/11/2019    Class: Chronic   Spinal stenosis of lumbar region with neurogenic claudication 08/20/2018   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   Primary osteoarthritis of right knee 02/27/2017   Right tennis elbow 11/11/2016   Muscle cramps 01/15/2016   Bilateral leg edema 01/08/2016   Radiculopathy 12/20/2015   Hyperglycemia 12/21/2014   Mastodynia, female 11/30/2014   S/P excision of fibroadenoma of breast 11/30/2014   Angioedema of lips 04/07/2014   Fibroadenoma of right breast 03/07/2014   Pelvic pain in female 06/19/2012   Menorrhagia 06/11/2012   SUI (stress urinary incontinence, female) 06/11/2012   HTN  (hypertension) 06/11/2012   IBS (irritable bowel syndrome) - diarrhea predominant 03/17/2012   MICROSCOPIC HEMATURIA 11/30/2010   BREAST PAIN, RIGHT 11/30/2010   BREAST MASS, RIGHT 01/12/2010   HYPERCHOLESTEROLEMIA 12/07/2007   CIGARETTE SMOKER 12/07/2007   DEGENERATIVE JOINT DISEASE 12/07/2007   Low back pain with sciatica 12/07/2007   HEADACHE 12/07/2007   Obesity 08/03/2007   ANXIETY 08/03/2007   Past Medical History:  Diagnosis Date   Anxiety    Back pain with radiation    Breast mass, right    Chronic pain    Chronic, continuous use of opioids    Complication of anesthesia 04/07/14   Allergic reaction to Lisinopril immediately following surgery   DJD (degenerative joint disease)    Fibromyalgia    Groin abscess    Headache(784.0)    History of IBS    Hypercholesterolemia    IBS (irritable bowel syndrome)    Lactose intolerance    Mild hypertension    Obesity    Tobacco use disorder    Umbilical hernia    Symptomatic    Family History  Problem Relation Age of Onset   Diabetes Father    Diabetes Mother    Prostate cancer Paternal Grandfather    Breast cancer Maternal Aunt 46    Past Surgical History:  Procedure Laterality Date   ABDOMINAL HYSTERECTOMY     ANTERIOR CERVICAL DECOMP/DISCECTOMY FUSION N/A 12/20/2015   Procedure: ANTERIOR CERVICAL DECOMPRESSION FUSION CERVICAL 4-5, CERVICAL 5-6, CERVICAL 6-7 WITH INSTRUMENTATION AND ALLOGRAFT;  Surgeon: Phylliss Bob, MD;  Location: [facility];  Service: Orthopedics;  Laterality: N/A;  Anterior cervical decompression fusion, cervical 4-5, cervical 5-6, cervical 6-7 with instrumentation and allograft   BACK SURGERY     BILATERAL SALPINGECTOMY  09/03/2012   Procedure: BILATERAL SALPINGECTOMY;  Surgeon: [name], MD;  Location: [facility];  Service: Gynecology;  Laterality: Bilateral;   BREAST BIOPSY Right 04/07/2014   Procedure: REMOVAL RIGHT BREAST MASS WITH WIRE LOCALIZATION;   Surgeon: [name], MD;  Location: [facility];  Service: General;  Laterality: Right;   BREAST EXCISIONAL BIOPSY Left    x2   BREAST LUMPECTOMY     x2   CARPAL TUNNEL RELEASE Left 05/11/2019   Procedure: LEFT CARPAL TUNNEL RELEASE, RIGHT TENNIS ELBOW MARCAINE/DEPO MEDROL INJECTION UNDER ANESTHESIA;  Surgeon: Jessy Oto, MD;  Location: [facility];  Service: Orthopedics;  Laterality: Left;   COLONOSCOPY W/ BIOPSIES  04/24/2012   per Dr. Carlean Purl, clear, repeat in 10 yrs    disectomy     ESOPHAGOGASTRODUODENOSCOPY     FINGER SURGERY     Right index-excision of mass    FOOT SURGERY Right    Bone Spurs   LAPAROSCOPIC HYSTERECTOMY  09/03/2012   Procedure: HYSTERECTOMY TOTAL LAPAROSCOPIC;  Surgeon: [name],  MD;  Location: [facility];  Service: Gynecology;  Laterality: N/A;   LUMBAR DISC SURGERY     TUBAL LIGATION     UMBILICAL HERNIA REPAIR N/A 05/01/2018   Procedure: UMBILICAL HERNIA REPAIR;  Surgeon: Judeth Horn, MD;  Location: [facility];  Service: General;  Laterality: N/A;   Social History   Occupational History   Occupation: Optometrist  Tobacco Use   Smoking status: Former Smoker    Years: 33.00    Types: Cigarettes   Smokeless tobacco: Never Used   Tobacco comment: quit March 2017  Substance and Sexual Activity   Alcohol use: Not Currently    Alcohol/week: 0.0 standard drinks    Comment: occ   Drug use: Yes    Comment: chronic use of dilaudid   Sexual activity: Yes    Birth control/protection: Surgical

## 2019-08-25 NOTE — Telephone Encounter (Signed)
So I was able to do an order in Epic but it needs your signature on it.  It printed so I placed it with you laptop, if you can sign it in the morning and give it back to me, I will fax it to [facility].  Thanks

## 2019-08-26 NOTE — Telephone Encounter (Signed)
Patient called this morning wanting to know the status of the RX for the Rolator walker.  Thank you.

## 2019-08-26 NOTE — Telephone Encounter (Signed)
I called and advised patient I had to get Jeneen Rinks to sign a new order this morning and that I had faxed it in to [facility]. She said ok.

## 2019-09-07 ENCOUNTER — Telehealth (INDEPENDENT_AMBULATORY_CARE_PROVIDER_SITE_OTHER): Payer: 59 | Admitting: Family Medicine

## 2019-09-07 ENCOUNTER — Encounter: Payer: Self-pay | Admitting: Family Medicine

## 2019-09-07 ENCOUNTER — Other Ambulatory Visit: Payer: Self-pay

## 2019-09-07 DIAGNOSIS — F1190 Opioid use, unspecified, uncomplicated: Secondary | ICD-10-CM | POA: Diagnosis not present

## 2019-09-07 DIAGNOSIS — G8929 Other chronic pain: Secondary | ICD-10-CM

## 2019-09-07 DIAGNOSIS — M5440 Lumbago with sciatica, unspecified side: Secondary | ICD-10-CM | POA: Diagnosis not present

## 2019-09-07 MED ORDER — HYDROMORPHONE HCL 4 MG PO TABS: 4.0000 mg | ORAL_TABLET | Freq: Four times a day (QID) | ORAL | 0 refills | Status: DC | PRN

## 2019-09-07 NOTE — Progress Notes (Signed)
Virtual Visit via Video Note  I connected with the patient on 09/07/19 at 11:30 AM EST by a video enabled telemedicine application and verified that I am speaking with the correct person using two identifiers.  Location patient: home Location provider:work or home office Persons participating in the virtual visit: patient, provider  I discussed the limitations of evaluation and management by telemedicine and the availability of in person appointments. The patient expressed understanding and agreed to proceed.   HPI: Here for pain management. She is doing well with her back but her knee pain is still an issue. She is scheduled for a second gel injection soon, and Dr. Louanne Skye is considering a TKR if this does not help.  Indication for chronic opioid: low back pain Medication and dose: Dilaudid 4 mg # pills per month: 120 Last UDS date: 12-15-17 Opioid Treatment Agreement signed (Y/N): 12-19-17 Opioid Treatment Agreement last reviewed with patient:  09-07-19 N[facility]SRS reviewed this encounter (include red flags): Yes    ROS: See pertinent positives and negatives per HPI.  Past Medical History:  Diagnosis Date  . Anxiety   . Back pain with radiation   . Breast mass, right   . Chronic pain   . Chronic, continuous use of opioids   . Complication of anesthesia 04/07/14   Allergic reaction to Lisinopril immediately following surgery  . DJD (degenerative joint disease)   . Fibromyalgia   . Groin abscess   . Headache(784.0)   . History of IBS   . Hypercholesterolemia   . IBS (irritable bowel syndrome)   . Lactose intolerance   . Mild hypertension   . Obesity   . Tobacco use disorder   . Umbilical hernia    Symptomatic    Past Surgical History:  Procedure Laterality Date  . ABDOMINAL HYSTERECTOMY    . ANTERIOR CERVICAL DECOMP/DISCECTOMY FUSION N/A 12/20/2015   Procedure: ANTERIOR CERVICAL DECOMPRESSION FUSION CERVICAL 4-5, CERVICAL 5-6, CERVICAL 6-7 WITH INSTRUMENTATION AND ALLOGRAFT;   Surgeon: Phylliss Bob, MD;  Location: [facility];  Service: Orthopedics;  Laterality: N/A;  Anterior cervical decompression fusion, cervical 4-5, cervical 5-6, cervical 6-7 with instrumentation and allograft  . BACK SURGERY    . BILATERAL SALPINGECTOMY  09/03/2012   Procedure: BILATERAL SALPINGECTOMY;  Surgeon: [name], MD;  Location: [facility];  Service: Gynecology;  Laterality: Bilateral;  . BREAST BIOPSY Right 04/07/2014   Procedure: REMOVAL RIGHT BREAST MASS WITH WIRE LOCALIZATION;  Surgeon: [name], MD;  Location: [facility];  Service: General;  Laterality: Right;  . BREAST EXCISIONAL BIOPSY Left    x2  . BREAST LUMPECTOMY     x2  . CARPAL TUNNEL RELEASE Left 05/11/2019   Procedure: LEFT CARPAL TUNNEL RELEASE, RIGHT TENNIS ELBOW MARCAINE/DEPO MEDROL INJECTION UNDER ANESTHESIA;  Surgeon: Jessy Oto, MD;  Location: [facility];  Service: Orthopedics;  Laterality: Left;  . COLONOSCOPY W/ BIOPSIES  04/24/2012   per Dr. Carlean Purl, clear, repeat in 10 yrs   . disectomy    . ESOPHAGOGASTRODUODENOSCOPY    . FINGER SURGERY     Right index-excision of mass   . FOOT SURGERY Right    Bone Spurs  . LAPAROSCOPIC HYSTERECTOMY  09/03/2012   Procedure: HYSTERECTOMY TOTAL LAPAROSCOPIC;  Surgeon: [name], MD;  Location: [facility];  Service: Gynecology;  Laterality: N/A;  . LUMBAR [place]    . TUBAL LIGATION    . UMBILICAL HERNIA REPAIR N/A 05/01/2018   Procedure: UMBILICAL HERNIA REPAIR;  Surgeon: Judeth Horn, MD;  Location: [facility];  Service: General;  Laterality: N/A;    Family History  Problem Relation Age of Onset  . Diabetes Father   . Diabetes Mother   . Prostate cancer Paternal Grandfather   . Breast cancer Maternal Aunt 46     Current Outpatient Medications:  .  Cholecalciferol 50000 units TABS, Take 1 tablet by mouth 2 (two) times a week., Disp: 16 tablet, Rfl: 0 .  cyclobenzaprine (FLEXERIL) 10 MG tablet, Take 10 mg by mouth once daily at bedtime, Disp: 90 tablet, Rfl:  3 .  dorzolamide-timolol (COSOPT) 22.3-6.8 MG/ML ophthalmic solution, Place 1 drop into both eyes as needed. , Disp: , Rfl:  .  furosemide (LASIX) 40 MG tablet, Take 2 tablets in the morning and one in the evening, Disp: 90 tablet, Rfl: 5 .  hydrALAZINE (APRESOLINE) 25 MG tablet, Take 1 tablet (25 mg total) by mouth 3 (three) times daily., Disp: 90 tablet, Rfl: 5 .  [START ON 11/08/2019] HYDROmorphone (DILAUDID) 4 MG tablet, Take 1 tablet (4 mg total) by mouth every 6 (six) hours as needed for severe pain., Disp: 120 tablet, Rfl: 0 .  metoprolol tartrate (LOPRESSOR) 50 MG tablet, Take 50 mg by mouth 2 (two) times daily., Disp: , Rfl:  .  ondansetron (ZOFRAN) 8 MG tablet, Take 1 tablet (8 mg total) by mouth every 8 (eight) hours as needed for nausea or vomiting., Disp: 30 tablet, Rfl: 0 .  potassium chloride (KLOR-CON 10) 10 MEQ tablet, Take 1 tablet (10 mEq total) by mouth 2 (two) times daily., Disp: 180 tablet, Rfl: 3 .  temazepam (RESTORIL) 30 MG capsule, Take 1 capsule (30 mg total) by mouth at bedtime as needed for sleep., Disp: 30 capsule, Rfl: 2  Current Facility-Administered Medications:  .  betamethasone acetate-betamethasone sodium phosphate (CELESTONE) injection 12 mg, 12 mg, Other, Once, Magnus Sinning, MD  EXAMTonette Bihari per patient if applicable:  GENERAL: alert, oriented, appears well and in no acute distress  HEENT: atraumatic, conjunttiva clear, no obvious abnormalities on inspection of external nose and ears  NECK: normal movements of the head and neck  LUNGS: on inspection no signs of respiratory distress, breathing rate appears normal, no obvious gross SOB, gasping or wheezing  CV: no obvious cyanosis  MS: moves all visible extremities without noticeable abnormality  PSYCH/NEURO: pleasant and cooperative, no obvious depression or anxiety, speech and thought processing grossly intact  ASSESSMENT AND PLAN: Pain management, meds were refilled.  [name],  MD  Discussed the following assessment and plan:  No diagnosis found.     I discussed the assessment and treatment plan with the patient. The patient was provided an opportunity to ask questions and all were answered. The patient agreed with the plan and demonstrated an understanding of the instructions.   The patient was advised to call back or seek an in-person evaluation if the symptoms worsen or if the condition fails to improve as anticipated.

## 2019-10-28 ENCOUNTER — Encounter: Payer: Self-pay | Admitting: Surgery

## 2019-10-28 ENCOUNTER — Ambulatory Visit: Payer: Self-pay

## 2019-10-28 ENCOUNTER — Ambulatory Visit (INDEPENDENT_AMBULATORY_CARE_PROVIDER_SITE_OTHER): Payer: 59 | Admitting: Surgery

## 2019-10-28 ENCOUNTER — Other Ambulatory Visit: Payer: Self-pay

## 2019-10-28 DIAGNOSIS — G8929 Other chronic pain: Secondary | ICD-10-CM

## 2019-10-28 DIAGNOSIS — M25561 Pain in right knee: Secondary | ICD-10-CM

## 2019-11-02 ENCOUNTER — Other Ambulatory Visit: Payer: Self-pay | Admitting: Family Medicine

## 2019-11-02 ENCOUNTER — Encounter: Payer: Self-pay | Admitting: Surgery

## 2019-11-02 NOTE — Telephone Encounter (Signed)
Last filled 05/18/2019 Last Ov 09/07/2019  Ok to fill?

## 2019-11-02 NOTE — Progress Notes (Signed)
54 year old black female returns with complaints of right knee pain.  She has known history of end-stage DJD right knee.  Last seen by me August 25, 2019 and I had performed an intra-articular right knee Marcaine/Depo-Medrol injection.  Patient has had multiple knee injections.  I have discussed with her previously that ultimately come down her needing definitive treatment with right total knee replacement.  She continues have ongoing pain in her knee.  Did not have much relief with most recent injection.  Exam Gait is antalgic.  Right knee positive crepitus.  Knee is diffusely tender.  Calf nontender.  Neuro vas intact.   Plan Since patient has not been getting any improvement with Marcaine/Depo-Medrol injections I will have her follow-up with Dr. Louanne Skye in 2 weeks and he can discuss whether or not she is a good operative candidate for total knee replacement.  Due to the significant diffuse knee tenderness and bone-on-bone changes that she has I do not think trying viscosupplementation at this time would be of any great benefit to her.  All questions answered.

## 2019-11-12 ENCOUNTER — Other Ambulatory Visit: Payer: Self-pay

## 2019-11-12 ENCOUNTER — Ambulatory Visit (INDEPENDENT_AMBULATORY_CARE_PROVIDER_SITE_OTHER): Payer: 59 | Admitting: Specialist

## 2019-11-12 ENCOUNTER — Encounter: Payer: Self-pay | Admitting: Specialist

## 2019-11-12 VITALS — BP 155/97 | HR 105 | Ht 64.0 in | Wt 297.2 lb

## 2019-11-12 DIAGNOSIS — M1711 Unilateral primary osteoarthritis, right knee: Secondary | ICD-10-CM | POA: Diagnosis not present

## 2019-11-12 MED ORDER — BUPIVACAINE HCL 0.25 % IJ SOLN
4.0000 mL | INTRAMUSCULAR | Status: AC | PRN
  Administered 2019-11-12: 11:00:00 4 mL via INTRA_ARTICULAR

## 2019-11-12 MED ORDER — TRAMADOL HCL 50 MG PO TABS: 100.0000 mg | ORAL_TABLET | Freq: Four times a day (QID) | ORAL | 0 refills | Status: AC | PRN

## 2019-11-12 MED ORDER — METHYLPREDNISOLONE ACETATE 40 MG/ML IJ SUSP
40.0000 mg | INTRAMUSCULAR | Status: AC | PRN
  Administered 2019-11-12: 40 mg via INTRA_ARTICULAR

## 2019-11-12 NOTE — Progress Notes (Signed)
Office Visit Note   [name]           Date of Birth: 06/12/1966           MRN: [id number] Visit Date: 11/12/2019              Requested by: [name], MD [place],  [place] [address] PCP: [name], MD   Assessment & Plan: Visit Diagnoses: No diagnosis found.  Plan:  Knee is suffering from osteoarthritis, only real proven treatments are Weight loss, NSIADs like diclofenac and exercise. Well padded shoes help. Ice the knee 2-3 times a day 15-20 mins at a time.   Follow-Up Instructions: No follow-ups on file.   Orders:  No orders of the defined types were placed in this encounter.  No orders of the defined types were placed in this encounter.     Procedures: Large Joint Inj: R knee on 11/12/2019 10:38 AM Indications: pain Details: 25 G 3.5 in needle, superolateral approach  Arthrogram: No  Medications: 40 mg methylPREDNISolone acetate 40 MG/ML; 4 mL bupivacaine 0.25 % Outcome: tolerated well, no immediate complications Procedure, treatment alternatives, risks and benefits explained, specific risks discussed. Consent was given by the patient. Immediately prior to procedure a time out was called to verify the correct patient, procedure, equipment, support staff and site/side marked as required. Patient was prepped and draped in the usual sterile fashion.       Clinical Data: No additional findings.   Subjective: Chief Complaint  Patient presents with  . Right Knee - Pain    63 yer old female with history of lumbar spinal stenosis and more recently she has worsening bilateral knee pain, the right knee is really bad with difficulty both bending and straightening the right knee. She has seen Dr. Sarajane Jews and he has discussed the situation with her about  Loosing weight but she indicates that the way she normally looses weight is with walking but her knee prevents her from walking. She has not considered weight loss with  bariatric surgery. She relates she does not want to consider weight loss because she is not that big. Her  BMI is Body mass index is 51.01 kg/m.    Review of Systems  Constitutional: Negative.   HENT: Negative.   Eyes: Negative.   Respiratory: Negative.   Cardiovascular: Negative.   Gastrointestinal: Negative.   Endocrine: Negative.   Genitourinary: Negative.   Musculoskeletal: Negative.   Skin: Negative.   Allergic/Immunologic: Negative.   Neurological: Negative.   Hematological: Negative.   Psychiatric/Behavioral: Negative.      Objective: Vital Signs: BP (!) 155/97 (BP Location: Left Arm, Patient Position: Sitting)   Pulse (!) 105   Ht 5\' 4"  (1.626 m)   Wt 297 lb 3.2 oz (134.8 kg)   LMP 08/17/2012   BMI 51.01 kg/m   Physical Exam Constitutional:      Appearance: She is well-developed.  HENT:     Head: Normocephalic and atraumatic.  Eyes:     Pupils: Pupils are equal, round, and reactive to light.  Pulmonary:     Effort: Pulmonary effort is normal.     Breath sounds: Normal breath sounds.  Abdominal:     General: Bowel sounds are normal.     Palpations: Abdomen is soft.  Musculoskeletal:     Cervical back: Normal range of motion and neck supple.     Right knee:     Instability Tests: Positive medial McMurray  test and positive lateral McMurray test.  Skin:    General: Skin is warm and dry.  Neurological:     Mental Status: She is alert and oriented to person, place, and time.  Psychiatric:        Behavior: Behavior normal.        Thought Content: Thought content normal.        Judgment: Judgment normal.     Right Knee Exam   Muscle Strength  The patient has normal right knee strength.  Tenderness  The patient is experiencing tenderness in the medial retinaculum, lateral retinaculum, medial joint line and lateral joint line.  Range of Motion  Extension:  -10 abnormal  Flexion:  80 abnormal   Tests  McMurray:  Medial - positive Lateral -  positive  Other  Erythema: absent Scars: absent Pulse: present Swelling: mild      Specialty Comments:  No specialty comments available.  Imaging: No results found.   PMFS History: Patient Active Problem List   Diagnosis Date Noted  . Carpal tunnel syndrome, left upper limb 05/11/2019    Priority: High    Class: Chronic  . Spinal stenosis of lumbar region with neurogenic claudication 08/20/2018  . 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 Primary osteoarthritis of right knee 02/27/2017  . Right tennis elbow 11/11/2016  . Muscle cramps 01/15/2016  . Bilateral leg edema 01/08/2016  . Radiculopathy 12/20/2015  . Hyperglycemia 12/21/2014  . Mastodynia, female 11/30/2014  . S/P excision of fibroadenoma of breast 11/30/2014  . Angioedema of lips 04/07/2014  . Fibroadenoma of right breast 03/07/2014  . Pelvic pain in female 06/19/2012  . Menorrhagia 06/11/2012  . SUI (stress urinary incontinence, female) 06/11/2012  . HTN (hypertension) 06/11/2012  . IBS (irritable bowel syndrome) - diarrhea predominant 03/17/2012  . MICROSCOPIC HEMATURIA 11/30/2010  . BREAST PAIN, RIGHT 11/30/2010  . BREAST MASS, RIGHT 01/12/2010  . HYPERCHOLESTEROLEMIA 12/07/2007  . CIGARETTE SMOKER 12/07/2007  . DEGENERATIVE JOINT DISEASE 12/07/2007  . Low back pain with sciatica 12/07/2007  . HEADACHE 12/07/2007  . Obesity 08/03/2007  . ANXIETY 08/03/2007   Past Medical History:  Diagnosis Date  . Anxiety   . Back pain with radiation   . Breast mass, right   . Chronic pain   . Chronic, continuous use of opioids   . Complication of anesthesia 04/07/14   Allergic reaction to Lisinopril immediately following surgery  . DJD (degenerative joint disease)   . Fibromyalgia   . Groin abscess   . Headache(784.0)   . History of IBS   . Hypercholesterolemia   . IBS (irritable bowel syndrome)   . Lactose intolerance   . Mild hypertension   . Obesity   . Tobacco use disorder   . Umbilical  hernia    Symptomatic    Family History  Problem Relation Age of Onset  . Diabetes Father   . Diabetes Mother   . Prostate cancer Paternal Grandfather   . Breast cancer Maternal Aunt 46    Past Surgical History:  Procedure Laterality Date  . ABDOMINAL HYSTERECTOMY    . ANTERIOR CERVICAL DECOMP/DISCECTOMY FUSION N/A 12/20/2015   Procedure: ANTERIOR CERVICAL DECOMPRESSION FUSION CERVICAL 4-5, CERVICAL 5-6, CERVICAL 6-7 WITH INSTRUMENTATION AND ALLOGRAFT;  Surgeon: Phylliss Bob, MD;  Location: [facility];  Service: Orthopedics;  Laterality: N/A;  Anterior cervical decompression fusion, cervical 4-5, cervical 5-6, cervical 6-7 with instrumentation and allograft  . BACK SURGERY    . BILATERAL SALPINGECTOMY  09/03/2012   Procedure:  BILATERAL SALPINGECTOMY;  Surgeon: [name], MD;  Location: [facility];  Service: Gynecology;  Laterality: Bilateral;  . BREAST BIOPSY Right 04/07/2014   Procedure: REMOVAL RIGHT BREAST MASS WITH WIRE LOCALIZATION;  Surgeon: [name], MD;  Location: [facility];  Service: General;  Laterality: Right;  . BREAST EXCISIONAL BIOPSY Left    x2  . BREAST LUMPECTOMY     x2  . CARPAL TUNNEL RELEASE Left 05/11/2019   Procedure: LEFT CARPAL TUNNEL RELEASE, RIGHT TENNIS ELBOW MARCAINE/DEPO MEDROL INJECTION UNDER ANESTHESIA;  Surgeon: Jessy Oto, MD;  Location: [facility];  Service: Orthopedics;  Laterality: Left;  . COLONOSCOPY W/ BIOPSIES  04/24/2012   per Dr. Carlean Purl, clear, repeat in 10 yrs   . disectomy    . ESOPHAGOGASTRODUODENOSCOPY    . FINGER SURGERY     Right index-excision of mass   . FOOT SURGERY Right    Bone Spurs  . LAPAROSCOPIC HYSTERECTOMY  09/03/2012   Procedure: HYSTERECTOMY TOTAL LAPAROSCOPIC;  Surgeon: [name], MD;  Location: [facility];  Service: Gynecology;  Laterality: N/A;  . LUMBAR [place]    . TUBAL LIGATION    . UMBILICAL HERNIA REPAIR N/A 05/01/2018   Procedure: UMBILICAL HERNIA REPAIR;  Surgeon: Judeth Horn, MD;  Location: [facility];   Service: General;  Laterality: N/A;   Social History   Occupational History  . Occupation: Optometrist  Tobacco Use  . Smoking status: Former Smoker    Years: 33.00    Types: Cigarettes  . Smokeless tobacco: Never Used  . Tobacco comment: quit March 2017  Substance and Sexual Activity  . Alcohol use: Not Currently    Alcohol/week: 0.0 standard drinks    Comment: occ  . Drug use: Yes    Comment: chronic use of dilaudid  . Sexual activity: Yes    Birth control/protection: Surgical

## 2019-11-12 NOTE — Addendum Note (Signed)
Addended by: Basil Dess on: 11/12/2019 02:28 PM   Modules accepted: Orders

## 2019-11-25 ENCOUNTER — Ambulatory Visit: Payer: 59 | Admitting: Surgery

## 2019-12-01 ENCOUNTER — Ambulatory Visit: Payer: 59 | Admitting: Surgery

## 2019-12-03 ENCOUNTER — Telehealth (INDEPENDENT_AMBULATORY_CARE_PROVIDER_SITE_OTHER): Payer: 59 | Admitting: Family Medicine

## 2019-12-03 ENCOUNTER — Encounter: Payer: Self-pay | Admitting: Family Medicine

## 2019-12-03 VITALS — Ht 64.0 in | Wt 265.0 lb

## 2019-12-03 DIAGNOSIS — M48062 Spinal stenosis, lumbar region with neurogenic claudication: Secondary | ICD-10-CM | POA: Diagnosis not present

## 2019-12-03 DIAGNOSIS — F1190 Opioid use, unspecified, uncomplicated: Secondary | ICD-10-CM

## 2019-12-03 MED ORDER — HYDROMORPHONE HCL 4 MG PO TABS: 4.0000 mg | ORAL_TABLET | Freq: Four times a day (QID) | ORAL | 0 refills | Status: AC | PRN

## 2019-12-03 MED ORDER — HYDROMORPHONE HCL 4 MG PO TABS: 4.0000 mg | ORAL_TABLET | Freq: Four times a day (QID) | ORAL | 0 refills | Status: DC | PRN

## 2019-12-03 NOTE — Progress Notes (Signed)
Virtual Visit via Telephone Note  I connected with the patient on 12/03/19 at  1:15 PM EDT by telephone and verified that I am speaking with the correct person using two identifiers.   I discussed the limitations, risks, security and privacy concerns of performing an evaluation and management service by telephone and the availability of in person appointments. I also discussed with the patient that there may be a patient responsible charge related to this service. The patient expressed understanding and agreed to proceed.  Location patient: home Location provider: work or home office Participants present for the call: patient, provider Patient did not have a visit in the prior 7 days to address this/these issue(s).   History of Present Illness: Here for pain management. She is still dealing with knee and back pain. Her Orthopedist said he will do a knee replacement on her if she loses 25 lbs.  Indication for chronic opioid: low back pain Medication and dose: Dilaudid 4 mg # pills per month: 120 Last UDS date: 12-15-17 Opioid Treatment Agreement signed (Y/N): 12-19-17 Opioid Treatment Agreement last reviewed with patient:  12-03-19 [place] reviewed this encounter (include red flags):  12-03-19    Observations/Objective: Patient sounds cheerful and well on the phone. I do not appreciate any SOB. Speech and thought processing are grossly intact. Patient reported vitals:  Assessment and Plan: Pain management, meds were refilled.  [name], MD   Follow Up Instructions:     [phone] 11-20 [phone] 21-30 I did not refer this patient for an OV in the next 24 hours for this/these issue(s).  I discussed the assessment and treatment plan with the patient. The patient was provided an opportunity to ask questions and all were answered. The patient agreed with the plan and demonstrated an understanding of the instructions.   The patient was advised to call back or seek an in-person  evaluation if the symptoms worsen or if the condition fails to improve as anticipated.  I provided  12 minutes of non-face-to-face time during this encounter.   [name], MD

## 2019-12-13 ENCOUNTER — Other Ambulatory Visit: Payer: Self-pay

## 2019-12-13 ENCOUNTER — Other Ambulatory Visit (INDEPENDENT_AMBULATORY_CARE_PROVIDER_SITE_OTHER): Payer: Self-pay | Admitting: Specialist

## 2019-12-13 ENCOUNTER — Ambulatory Visit (INDEPENDENT_AMBULATORY_CARE_PROVIDER_SITE_OTHER): Payer: 59 | Admitting: Family Medicine

## 2019-12-13 ENCOUNTER — Encounter: Payer: Self-pay | Admitting: Family Medicine

## 2019-12-13 ENCOUNTER — Ambulatory Visit (INDEPENDENT_AMBULATORY_CARE_PROVIDER_SITE_OTHER): Payer: 59

## 2019-12-13 VITALS — BP 140/80 | HR 125 | Temp 97.4°F | Wt 298.2 lb

## 2019-12-13 DIAGNOSIS — R0781 Pleurodynia: Secondary | ICD-10-CM

## 2019-12-13 NOTE — Progress Notes (Signed)
   Subjective:    Patient ID: Dana [name], female    DOB: 1966-02-17, 54 y.o.   MRN: [id number]  HPI Here for the sudden onset of sharp pain in the right ribs that started 5 days ago. No recent trauma or falls. Interestingly this started several hours after she received her first Covid vaccine on 12-08-19. She says it hurts to take a deep breath, but she is not SOB. No coughing or fever. She has been nauseated but has not vomited. No change in urinations or BMs. She is taking her usual Hydromorphone.   Review of Systems  Constitutional: Negative.   Respiratory: Negative.   Cardiovascular: Positive for chest pain. Negative for palpitations and leg swelling.  Gastrointestinal: Positive for nausea. Negative for abdominal distention, abdominal pain, anal bleeding, blood in stool, constipation, diarrhea, rectal pain and vomiting.       Objective:   Physical Exam Constitutional:      Comments: In pain   Cardiovascular:     Rate and Rhythm: Normal rate and regular rhythm.     Pulses: Normal pulses.     Heart sounds: Normal heart sounds.  Pulmonary:     Effort: Pulmonary effort is normal. No respiratory distress.     Breath sounds: Normal breath sounds. No stridor. No wheezing, rhonchi or rales.     Comments: She is very tender over the right lateral inferior ribs. No crepitus  Abdominal:     General: Abdomen is flat. Bowel sounds are normal. There is no distension.     Palpations: Abdomen is soft. There is no mass.     Tenderness: There is no abdominal tenderness. There is no guarding or rebound.     Hernia: No hernia is present.  Neurological:     Mental Status: She is alert.           Assessment & Plan:  Right rib pain. We will get Xrays of the right ribs today. She will add Aleve to her current regimen by taking 2 tabs BID as needed. She will apply ice packs.  [name], MD

## 2019-12-14 ENCOUNTER — Telehealth: Payer: Self-pay | Admitting: Family Medicine

## 2019-12-14 NOTE — Telephone Encounter (Signed)
Spoke with the patient. She is aware of Dr. Barbie Banner message below. Patient refused to go to the ER at this time. Dr. Sarajane Jews is aware. Nothing further needed.

## 2019-12-14 NOTE — Telephone Encounter (Signed)
ATC, unable to leave a voice mail.  

## 2019-12-14 NOTE — Telephone Encounter (Signed)
I do not know why she is vomiting. Her rib Xrays yesterday were normal, so no fractures were seen. I suggest she go to the ER for more testing (labs, etc)

## 2019-12-14 NOTE — Telephone Encounter (Signed)
Pt had a visit yesterday about pain in her side, she called to explain that she is not feeling any better the pain is moving and she is now vomiting. Pt would like a call back to go over what may me wrong with her.

## 2020-02-16 ENCOUNTER — Other Ambulatory Visit: Payer: Self-pay | Admitting: Family Medicine

## 2020-02-29 ENCOUNTER — Telehealth: Payer: Self-pay | Admitting: Physical Medicine and Rehabilitation

## 2020-02-29 NOTE — Telephone Encounter (Signed)
ok 

## 2020-02-29 NOTE — Telephone Encounter (Signed)
Pt is scheduled for 04/05/20 with driver

## 2020-02-29 NOTE — Telephone Encounter (Signed)
Patient called requesting to set an appointment for an back epidural injection in lower back. Patient stated to need a call back from Dr. Ernestina Patches office to set appointment. Patient phone number is [phone].

## 2020-02-29 NOTE — Telephone Encounter (Signed)
Right L4 TF 07/08/2019. Ok to repeat if helped, same problem/side, and no new injury?

## 2020-02-29 NOTE — Telephone Encounter (Signed)
[id number], Injection(s), anesthetic agent and/or more  Notification/Prior Authorization not required if procedure performed in Office; otherwise may be required for this service.

## 2020-03-14 ENCOUNTER — Ambulatory Visit (INDEPENDENT_AMBULATORY_CARE_PROVIDER_SITE_OTHER): Payer: 59 | Admitting: Family Medicine

## 2020-03-14 ENCOUNTER — Encounter: Payer: Self-pay | Admitting: Family Medicine

## 2020-03-14 ENCOUNTER — Other Ambulatory Visit: Payer: Self-pay

## 2020-03-14 VITALS — BP 140/80 | HR 97 | Temp 97.6°F | Wt 290.4 lb

## 2020-03-14 DIAGNOSIS — F1190 Opioid use, unspecified, uncomplicated: Secondary | ICD-10-CM

## 2020-03-14 DIAGNOSIS — L02421 Furuncle of right axilla: Secondary | ICD-10-CM

## 2020-03-14 MED ORDER — METOPROLOL TARTRATE 50 MG PO TABS: 50.0000 mg | ORAL_TABLET | Freq: Two times a day (BID) | ORAL | 3 refills | Status: DC

## 2020-03-14 MED ORDER — DOXYCYCLINE HYCLATE 100 MG PO CAPS: 100.0000 mg | ORAL_CAPSULE | Freq: Two times a day (BID) | ORAL | 0 refills | Status: AC

## 2020-03-14 NOTE — Progress Notes (Signed)
   Subjective:    Patient ID: Dana [name], female    DOB: 19-Aug-1966, 54 y.o.   MRN: [id number]  HPI Here for a painful lump in the right armpit that began bothering her 2 years ago. This will occasionally swell up and become tender. It will sometimes drain purulent fluid, then it goes back down again. Now it has been bothering her for the past 2 weeks. No fever.    Review of Systems  Constitutional: Negative.   Respiratory: Negative.   Cardiovascular: Negative.        Objective:   Physical Exam Constitutional:      Appearance: Normal appearance.  Cardiovascular:     Rate and Rhythm: Normal rate and regular rhythm.     Pulses: Normal pulses.     Heart sounds: Normal heart sounds.  Pulmonary:     Effort: Pulmonary effort is normal.     Breath sounds: Normal breath sounds.  Skin:    Comments: There is a small tender boil in the right axilla which produces fluid with gentle squeezing   Neurological:     Mental Status: She is alert.           Assessment & Plan:  Boil, treat with Doxycycline.  [name], MD

## 2020-03-30 ENCOUNTER — Encounter: Payer: Self-pay | Admitting: Specialist

## 2020-03-30 ENCOUNTER — Other Ambulatory Visit: Payer: Self-pay

## 2020-03-30 ENCOUNTER — Ambulatory Visit (INDEPENDENT_AMBULATORY_CARE_PROVIDER_SITE_OTHER): Payer: 59 | Admitting: Specialist

## 2020-03-30 VITALS — BP 145/98 | HR 107 | Ht 64.0 in | Wt 290.0 lb

## 2020-03-30 DIAGNOSIS — M1711 Unilateral primary osteoarthritis, right knee: Secondary | ICD-10-CM | POA: Diagnosis not present

## 2020-03-30 DIAGNOSIS — M1712 Unilateral primary osteoarthritis, left knee: Secondary | ICD-10-CM

## 2020-03-30 MED ORDER — BUPIVACAINE HCL 0.25 % IJ SOLN
4.0000 mL | INTRAMUSCULAR | Status: AC | PRN
  Administered 2020-03-30: 4 mL via INTRA_ARTICULAR

## 2020-03-30 MED ORDER — METHYLPREDNISOLONE ACETATE 40 MG/ML IJ SUSP
40.0000 mg | INTRAMUSCULAR | Status: AC | PRN
  Administered 2020-03-30: 40 mg via INTRA_ARTICULAR

## 2020-03-30 MED ORDER — MELOXICAM 15 MG PO TABS: 15.0000 mg | ORAL_TABLET | Freq: Every day | ORAL | 3 refills | Status: DC

## 2020-03-30 NOTE — Progress Notes (Signed)
Office Visit Note   [name]           Date of Birth: 04/02/1966           MRN: [id number] Visit Date: 03/30/2020              Requested by: [name], MD [place],  [place] [address] PCP: [name], MD   Assessment & Plan: Visit Diagnoses:  1. Unilateral primary osteoarthritis, right knee   2. Unilateral primary osteoarthritis, left knee     Plan: Plan: Knee is suffering from osteoarthritis, only real proven treatments are Weight loss, NSIADs like diclofenac and exercise. Well padded shoes help. Ice the knee that is suffering from osteoarthritis, only real proven treatments are Weight loss, NSIADs like diclofenac and exercise. Well padded shoes help. Ice the knee 2-3 times a day 15-20 mins at a time.-3 times a day 15-20 mins at a time. Hot showers in the AM.  Injection with steroid may be of benefit. Hemp CBD capsules, amazon.com 5,000-7,000 mg per bottle, 60 capsules per bottle, take one capsule twice a day. Cane in the left hand to use with left leg weight bearing or if the right knee is more painful use the cane in the right hand. Follow-Up Instructions: No follow-ups on file.  Follow-Up Instructions: Return in about 6 weeks (around 05/11/2020).   Orders:  Orders Placed This Encounter  Procedures  . Large Joint Inj   No orders of the defined types were placed in this encounter.     Procedures: Large Joint Inj: bilateral knee on 03/30/2020 2:25 PM Indications: pain Details: 25 G 1.5 in needle, anterolateral approach  Arthrogram: No  Medications (Right): 4 mL bupivacaine 0.25 %; 40 mg methylPREDNISolone acetate 40 MG/ML Medications (Left): 4 mL bupivacaine 0.25 %; 40 mg methylPREDNISolone acetate 40 MG/ML Outcome: tolerated well, no immediate complications  Bandaid applied to both knees.  Procedure, treatment alternatives, risks and benefits explained, specific risks discussed. Consent was given by the patient.  Immediately prior to procedure a time out was called to verify the correct patient, procedure, equipment, support staff and site/side marked as required. Patient was prepped and draped in the usual sterile fashion.       Clinical Data: Findings:  Narrative & Impression CLINICAL DATA:  Right knee pain.  No known injury.  EXAM: MRI OF THE RIGHT KNEE WITHOUT CONTRAST  TECHNIQUE: Multiplanar, multisequence MR imaging of the knee was performed. No intravenous contrast was administered.  COMPARISON:  11/07/2012  FINDINGS: MENISCI  Medial meniscus: Complex tear of body-posterior horn of the medial meniscus with a radial component and peripheral meniscal extrusion.  Lateral meniscus:  Intact.  LIGAMENTS  Cruciates:  Intact ACL and PCL.  Collaterals: Medial collateral ligament is intact. Lateral collateral ligament complex is intact.  CARTILAGE  Patellofemoral: Partial-thickness cartilage loss of the patellofemoral compartment with areas of full-thickness cartilage loss along the periphery of the medial patellar facet. Small marginal osteophytes.  Medial: High-grade partial-thickness cartilage loss with areas of full-thickness cartilage loss of the medial femorotibial compartment with subchondral reactive marrow edema at the periphery of the medial tibial plateau. Small marginal osteophytes.  Lateral: Partial-thickness cartilage loss of the lateral femorotibial compartment with small marginal osteophytes.  Joint: Small joint effusion. Normal Hoffa's fat. No plical thickening.  Popliteal Fossa: Moderate-sized Baker's cyst. Intact popliteus tendon.  Extensor Mechanism: Intact quadriceps tendon. Intact patellar tendon. Intact medial patellar retinaculum. Intact lateral patellar retinaculum. Intact MPFL.  Bones:  No acute osseous abnormality.  No aggressive osseous lesion.  Other: No fluid collection or hematoma.  Muscles are  normal.  IMPRESSION: 1. Complex tear of body-posterior horn of the medial meniscus with a radial component and peripheral meniscal extrusion. 2. Tricompartmental cartilage abnormalities as described above most severe in the medial femorotibial compartment and patellofemoral compartment consistent with tricompartmental osteoarthritis. 3. Moderate-sized Baker's cyst.   Electronically Signed   By: Kathreen Devoid   On: 09/04/2018 08:38      Subjective: Chief Complaint  Patient presents with  . Right Knee - Pain  . Left Knee - Pain    54 year old female with history of right knee osteoarthritis, she has elevated BMI. She has lost about 9 lbs and is in a program to lose more weight. She has joined [employer] and plans to lose about 60 lbs. Her goal is to reach 200 lbs. At last visit we discussed the situation and the need to decrease the stresses on thelegs and Back and she understands and has very serious goals that I believe she will reach. No bowel or bladder difficulties. AM stiffness, difficulty standing in the AM and with initiating  Standing and walking after sitting. She has used pennsaid to the hands and where ever she hurts.    Review of Systems  Constitutional: Negative.   HENT: Negative.   Eyes: Negative.   Respiratory: Negative.   Cardiovascular: Negative.   Gastrointestinal: Negative.   Endocrine: Negative.   Genitourinary: Negative.   Musculoskeletal: Negative.   Skin: Negative.   Allergic/Immunologic: Negative.   Neurological: Negative.   Hematological: Negative.   Psychiatric/Behavioral: Negative.      Objective: Vital Signs: BP (!) 145/98 (BP Location: Left Arm, Patient Position: Sitting)   Pulse (!) 107   Ht 5\' 4"  (1.626 m)   Wt 290 lb (131.5 kg)   LMP 08/17/2012   BMI 49.78 kg/m   Physical Exam Constitutional:      Appearance: She is well-developed.  HENT:     Head: Normocephalic and atraumatic.  Eyes:     Pupils: Pupils are equal, round,  and reactive to light.  Pulmonary:     Effort: Pulmonary effort is normal.     Breath sounds: Normal breath sounds.  Abdominal:     General: Bowel sounds are normal.     Palpations: Abdomen is soft.  Musculoskeletal:     Cervical back: Normal range of motion and neck supple.     Right knee: No effusion.     Instability Tests: Medial McMurray test positive. Lateral McMurray test negative.     Left knee: No effusion.     Instability Tests: Medial McMurray test positive. Lateral McMurray test negative.  Skin:    General: Skin is warm and dry.  Neurological:     Mental Status: She is alert and oriented to person, place, and time.  Psychiatric:        Behavior: Behavior normal.        Thought Content: Thought content normal.        Judgment: Judgment normal.     Right Knee Exam   Tenderness  The patient is experiencing tenderness in the patella and medial joint line.  Range of Motion  Extension: normal  Flexion: 120   Tests  McMurray:  Medial - positive Lateral - negative Varus: positive Valgus: negative Lachman:  Anterior - negative    Posterior - negative Drawer:  Anterior - negative    Posterior -  negative Pivot shift: negative Patellar apprehension: negative  Other  Erythema: absent Scars: absent Sensation: normal Pulse: present Swelling: mild Effusion: no effusion present   Left Knee Exam   Tenderness  The patient is experiencing tenderness in the patella and medial joint line.  Range of Motion  Extension: normal  Flexion:  120 abnormal   Tests  McMurray:  Medial - positive Lateral - negative Varus: positive  Lachman:  Anterior - negative    Posterior - negative Drawer:  Anterior - negative     Posterior - negative Pivot shift: negative Patellar apprehension: negative  Other  Erythema: absent Scars: absent Sensation: normal Pulse: present Swelling: mild Effusion: no effusion present      Specialty Comments:  No specialty comments  available.  Imaging: No results found.   PMFS History: Patient Active Problem List   Diagnosis Date Noted  . Carpal tunnel syndrome, left upper limb 05/11/2019    Priority: High    Class: Chronic  . Spinal stenosis of lumbar region with neurogenic claudication 08/20/2018  . 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 Primary osteoarthritis of right knee 02/27/2017  . Right tennis elbow 11/11/2016  . Muscle cramps 01/15/2016  . Bilateral leg edema 01/08/2016  . Radiculopathy 12/20/2015  . Hyperglycemia 12/21/2014  . Mastodynia, female 11/30/2014  . S/P excision of fibroadenoma of breast 11/30/2014  . Angioedema of lips 04/07/2014  . Fibroadenoma of right breast 03/07/2014  . Pelvic pain in female 06/19/2012  . Menorrhagia 06/11/2012  . SUI (stress urinary incontinence, female) 06/11/2012  . HTN (hypertension) 06/11/2012  . IBS (irritable bowel syndrome) - diarrhea predominant 03/17/2012  . MICROSCOPIC HEMATURIA 11/30/2010  . BREAST PAIN, RIGHT 11/30/2010  . BREAST MASS, RIGHT 01/12/2010  . HYPERCHOLESTEROLEMIA 12/07/2007  . CIGARETTE SMOKER 12/07/2007  . DEGENERATIVE JOINT DISEASE 12/07/2007  . Low back pain with sciatica 12/07/2007  . HEADACHE 12/07/2007  . Obesity 08/03/2007  . ANXIETY 08/03/2007   Past Medical History:  Diagnosis Date  . Anxiety   . Back pain with radiation   . Breast mass, right   . Chronic pain   . Chronic, continuous use of opioids   . Complication of anesthesia 04/07/14   Allergic reaction to Lisinopril immediately following surgery  . DJD (degenerative joint disease)   . Fibromyalgia   . Groin abscess   . Headache(784.0)   . History of IBS   . Hypercholesterolemia   . IBS (irritable bowel syndrome)   . Lactose intolerance   . Mild hypertension   . Obesity   . Tobacco use disorder   . Umbilical hernia    Symptomatic    Family History  Problem Relation Age of Onset  . Diabetes Father   . Diabetes Mother   . Prostate cancer  Paternal Grandfather   . Breast cancer Maternal Aunt 46    Past Surgical History:  Procedure Laterality Date  . ABDOMINAL HYSTERECTOMY    . ANTERIOR CERVICAL DECOMP/DISCECTOMY FUSION N/A 12/20/2015   Procedure: ANTERIOR CERVICAL DECOMPRESSION FUSION CERVICAL 4-5, CERVICAL 5-6, CERVICAL 6-7 WITH INSTRUMENTATION AND ALLOGRAFT;  Surgeon: Phylliss Bob, MD;  Location: [facility];  Service: Orthopedics;  Laterality: N/A;  Anterior cervical decompression fusion, cervical 4-5, cervical 5-6, cervical 6-7 with instrumentation and allograft  . BACK SURGERY    . BILATERAL SALPINGECTOMY  09/03/2012   Procedure: BILATERAL SALPINGECTOMY;  Surgeon: [name], MD;  Location: [facility];  Service: Gynecology;  Laterality: Bilateral;  . BREAST BIOPSY Right 04/07/2014   Procedure: REMOVAL  RIGHT BREAST MASS WITH WIRE LOCALIZATION;  Surgeon: [name], MD;  Location: [facility];  Service: General;  Laterality: Right;  . BREAST EXCISIONAL BIOPSY Left    x2  . BREAST LUMPECTOMY     x2  . CARPAL TUNNEL RELEASE Left 05/11/2019   Procedure: LEFT CARPAL TUNNEL RELEASE, RIGHT TENNIS ELBOW MARCAINE/DEPO MEDROL INJECTION UNDER ANESTHESIA;  Surgeon: Jessy Oto, MD;  Location: [facility];  Service: Orthopedics;  Laterality: Left;  . COLONOSCOPY W/ BIOPSIES  04/24/2012   per Dr. Carlean Purl, clear, repeat in 10 yrs   . disectomy    . ESOPHAGOGASTRODUODENOSCOPY    . FINGER SURGERY     Right index-excision of mass   . FOOT SURGERY Right    Bone Spurs  . LAPAROSCOPIC HYSTERECTOMY  09/03/2012   Procedure: HYSTERECTOMY TOTAL LAPAROSCOPIC;  Surgeon: [name], MD;  Location: [facility];  Service: Gynecology;  Laterality: N/A;  . LUMBAR [place]    . TUBAL LIGATION    . UMBILICAL HERNIA REPAIR N/A 05/01/2018   Procedure: UMBILICAL HERNIA REPAIR;  Surgeon: Judeth Horn, MD;  Location: [facility];  Service: General;  Laterality: N/A;   Social History   Occupational History  . Occupation: Optometrist  Tobacco Use  .  Smoking status: Former Smoker    Years: 33.00    Types: Cigarettes  . Smokeless tobacco: Never Used  . Tobacco comment: quit March 2017  Vaping Use  . Vaping Use: Never used  Substance and Sexual Activity  . Alcohol use: Not Currently    Alcohol/week: 0.0 standard drinks    Comment: occ  . Drug use: Yes    Comment: chronic use of dilaudid  . Sexual activity: Yes    Birth control/protection: Surgical

## 2020-03-30 NOTE — Patient Instructions (Addendum)
Plan: Knee is suffering from osteoarthritis, only real proven treatments are Weight loss, NSIADs like diclofenac and exercise. Well padded shoes help. Ice the knee that is suffering from osteoarthritis, only real proven treatments are Weight loss, NSIADs like diclofenac and exercise. Well padded shoes help. Ice the knee 2-3 times a day 15-20 mins at a time.-3 times a day 15-20 mins at a time. Hot showers in the AM.  Injection with steroid may be of benefit. Hemp CBD capsules, amazon.com 5,000-7,000 mg per bottle, 60 capsules per bottle, take one capsule twice a day. Cane in the left hand to use with left leg weight bearing or if the right knee is more painful use the cane in the right hand. Follow-Up Instructions: No follow-ups on file.

## 2020-04-05 ENCOUNTER — Ambulatory Visit: Payer: Self-pay

## 2020-04-05 ENCOUNTER — Other Ambulatory Visit: Payer: Self-pay

## 2020-04-05 ENCOUNTER — Encounter: Payer: Self-pay | Admitting: Physical Medicine and Rehabilitation

## 2020-04-05 ENCOUNTER — Ambulatory Visit (INDEPENDENT_AMBULATORY_CARE_PROVIDER_SITE_OTHER): Payer: 59 | Admitting: Physical Medicine and Rehabilitation

## 2020-04-05 VITALS — BP 141/86 | HR 96

## 2020-04-05 DIAGNOSIS — M5416 Radiculopathy, lumbar region: Secondary | ICD-10-CM

## 2020-04-05 DIAGNOSIS — M48062 Spinal stenosis, lumbar region with neurogenic claudication: Secondary | ICD-10-CM

## 2020-04-05 MED ORDER — METHYLPREDNISOLONE ACETATE 80 MG/ML IJ SUSP
80.0000 mg | Freq: Once | INTRAMUSCULAR | Status: AC
  Administered 2020-04-05: 80 mg

## 2020-04-05 NOTE — Progress Notes (Signed)
Dana [name] - 54 y.o. female MRN [id number]  Date of birth: Nov 23, 1965  Office Visit Note: Visit Date: 04/05/2020 PCP: [name], MD Referred by: [name], MD  Subjective: Chief Complaint  Patient presents with  . Lower Back - Pain  . Right Leg - Pain   HPI:  Dana [name] is a 54 y.o. female who comes in today for planned repeat Right L4-L5 Lumbar epidural steroid injection with fluoroscopic guidance.  The patient has failed conservative care including home exercise, medications, time and activity modification.  This injection will be diagnostic and hopefully therapeutic.  Please see requesting physician notes for further details and justification. Patient received more than 50% pain relief from prior injection on 07/08/2019.   Referring: Dr. Basil Dess   ROS Otherwise per HPI.  Assessment & Plan: Visit Diagnoses:  1. Lumbar radiculopathy   2. Spinal stenosis of lumbar region with neurogenic claudication     Plan: No additional findings.   Meds & Orders:  Meds ordered this encounter  Medications  . methylPREDNISolone acetate (DEPO-MEDROL) injection 80 mg    Orders Placed This Encounter  Procedures  . XR C-ARM NO REPORT  . Epidural Steroid injection    Follow-up: Return if symptoms worsen or fail to improve.   Procedures: No procedures performed  Lumbosacral Transforaminal Epidural Steroid Injection - Sub-Pedicular Approach with Fluoroscopic Guidance  [name]      Date of Birth: June 23, 1966 MRN: [id number] PCP: [name], MD      Visit Date: 04/05/2020   Universal Protocol:    Date/Time: 04/05/2020  Consent Given By: the patient  Position: PRONE  Additional Comments: Vital signs were monitored before and after the procedure. Patient was prepped and draped in the usual sterile fashion. The correct patient, procedure, and site was verified.   Injection Procedure Details:  Procedure Site One Meds Administered:  Meds  ordered this encounter  Medications  . methylPREDNISolone acetate (DEPO-MEDROL) injection 80 mg    Laterality: Right  Location/Site:  L4-L5  Needle size: 22 G  Needle type: Spinal  Needle Placement: Transforaminal  Findings:    -Comments: Excellent flow of contrast along the nerve, nerve root and into the epidural space.  Procedure Details: After squaring off the end-plates to get a true AP view, the C-arm was positioned so that an oblique view of the foramen as noted above was visualized. The target area is just inferior to the "nose of the scotty dog" or sub pedicular. The soft tissues overlying this structure were infiltrated with 2-3 ml. of 1% Lidocaine without Epinephrine.  The spinal needle was inserted toward the target using a "trajectory" view along the fluoroscope beam.  Under AP and lateral visualization, the needle was advanced so it did not puncture dura and was located close the 6 O'Clock position of the pedical in AP tracterory. Biplanar projections were used to confirm position. Aspiration was confirmed to be negative for CSF and/or blood. A 1-2 ml. volume of Isovue-250 was injected and flow of contrast was noted at each level. Radiographs were obtained for documentation purposes.   After attaining the desired flow of contrast documented above, a 0.5 to 1.0 ml test dose of 0.25% Marcaine was injected into each respective transforaminal space.  The patient was observed for 90 seconds post injection.  After no sensory deficits were reported, and normal lower extremity motor function was noted,   the above injectate was administered so that equal amounts of  the injectate were placed at each foramen (level) into the transforaminal epidural space.   Additional Comments:  The patient tolerated the procedure well Dressing: 2 x 2 sterile gauze and Band-Aid    Post-procedure details: Patient was observed during the procedure. Post-procedure instructions were  reviewed.  Patient left the clinic in stable condition.      Clinical History: MRI LUMBAR SPINE WITHOUT CONTRAST  TECHNIQUE: Multiplanar, multisequence MR imaging of the lumbar spine was performed. No intravenous contrast was administered.  COMPARISON:  CT Abdomen and Pelvis 02/26/2018. Lumbar MRI 06/13/2017.  FINDINGS: Segmentation: Normal as seen on the comparison CT, which is the same numbering system used on the 2018 MRI.  Alignment: Stable vertebral height and alignment with mild straightening of lumbar lordosis. No spondylolisthesis.  Vertebrae: Chronic degenerative endplate marrow signal changes at L2-L3, sclerotic on the recent CT. Background bone marrow signal is within normal limits. No marrow edema or evidence of acute osseous abnormality. Intact visible sacrum and SI joints.  Conus medullaris and cauda equina: Conus extends to the L1 level. No lower spinal cord or conus signal abnormality.  Paraspinal and other soft tissues: Large body habitus. Stable visible abdominal viscera. Negative visualized posterior paraspinal soft tissues.  Disc levels:  T10-T11: Disc space loss with circumferential disc bulge. Mild posterior element hypertrophy. Mild spinal stenosis and left T10 foraminal stenosis. Possible mild associated spinal cord mass effect, but no definite cord signal abnormality.  T11-T12: Broad-based posterior disc bulge or protrusion. Borderline to mild spinal stenosis.  T12-L1: Progressed broad-based posterior disc bulging since 2018. Mild facet hypertrophy and epidural lipomatosis. New mild spinal stenosis. No mass effect on the conus.  L1-L2: Progressed epidural lipomatosis. Chronic circumferential disc bulging which may also be mildly increased. Stable mild facet hypertrophy. Increased spinal stenosis, now mild to moderate. Mild left L1 foraminal stenosis is stable.  L2-L3: Chronic severe disc space loss. Chronic circumferential  disc osteophyte complex. Interval stable epidural lipomatosis and mild to moderate facet hypertrophy. Stable mild to moderate spinal stenosis. Stable mild right L2 foraminal stenosis.  L3-L4: Chronic circumferential but mostly far lateral disc bulging and moderate facet and ligament flavum hypertrophy. Stable epidural lipomatosis and mild to moderate spinal stenosis. Stable mild right L3 foraminal stenosis.  L4-L5: Chronic broad-based posterior disc bulge or protrusion and moderate to severe facet hypertrophy is stable. Chronic degenerative facet joint fluid. Chronic epidural lipomatosis. Stable severe spinal stenosis (series 5, image 23). No significant foraminal stenosis.  L5-S1: Chronic broad-based posterior disc bulge or protrusion appears stable along with moderate bilateral facet hypertrophy. Stable epidural lipomatosis and mild spinal stenosis.  IMPRESSION: 1. Progressed spinal stenosis since 2018 at T12-L1 and L1-L2, mild to moderate, and related to increased disc bulging and epidural lipomatosis. 2. Stable severe multifactorial spinal stenosis at L4-L5, and moderate spinal stenosis at L2-L3 and L3-L4. 3. Mild lower thoracic spinal stenosis with up to mild spinal cord mass effect at T10-T11, but no lower spinal cord or conus signal abnormality. 4.  No acute osseous abnormality.   Electronically Signed   By: Genevie Ann M.D.   On: 04/16/2018 15:43     Objective:  VS:  HT:    WT:   BMI:     BP:(!) 141/86  HR:96bpm  TEMP: ( )  RESP:  Physical Exam Constitutional:      General: She is not in acute distress.    Appearance: Normal appearance. She is not ill-appearing.  HENT:     Head: Normocephalic and atraumatic.  Right Ear: External ear normal.     Left Ear: External ear normal.  Eyes:     Extraocular Movements: Extraocular movements intact.  Cardiovascular:     Rate and Rhythm: Normal rate.     Pulses: Normal pulses.  Musculoskeletal:     Right  lower leg: No edema.     Left lower leg: No edema.     Comments: Patient has good distal strength with no pain over the greater trochanters.  No clonus or focal weakness.  Skin:    Findings: No erythema, lesion or rash.  Neurological:     General: No focal deficit present.     Mental Status: She is alert and oriented to person, place, and time.     Sensory: No sensory deficit.     Motor: No weakness or abnormal muscle tone.     Coordination: Coordination normal.  Psychiatric:        Mood and Affect: Mood normal.        Behavior: Behavior normal.      Imaging: No results found.

## 2020-04-05 NOTE — Procedures (Signed)
Lumbosacral Transforaminal Epidural Steroid Injection - Sub-Pedicular Approach with Fluoroscopic Guidance  [name]      Date of Birth: 27-Oct-1965 MRN: [id number] PCP: [name], MD      Visit Date: 04/05/2020   Universal Protocol:    Date/Time: 04/05/2020  Consent Given By: the patient  Position: PRONE  Additional Comments: Vital signs were monitored before and after the procedure. Patient was prepped and draped in the usual sterile fashion. The correct patient, procedure, and site was verified.   Injection Procedure Details:  Procedure Site One Meds Administered:  Meds ordered this encounter  Medications  . methylPREDNISolone acetate (DEPO-MEDROL) injection 80 mg    Laterality: Right  Location/Site:  L4-L5  Needle size: 22 G  Needle type: Spinal  Needle Placement: Transforaminal  Findings:    -Comments: Excellent flow of contrast along the nerve, nerve root and into the epidural space.  Procedure Details: After squaring off the end-plates to get a true AP view, the C-arm was positioned so that an oblique view of the foramen as noted above was visualized. The target area is just inferior to the "nose of the scotty dog" or sub pedicular. The soft tissues overlying this structure were infiltrated with 2-3 ml. of 1% Lidocaine without Epinephrine.  The spinal needle was inserted toward the target using a "trajectory" view along the fluoroscope beam.  Under AP and lateral visualization, the needle was advanced so it did not puncture dura and was located close the 6 O'Clock position of the pedical in AP tracterory. Biplanar projections were used to confirm position. Aspiration was confirmed to be negative for CSF and/or blood. A 1-2 ml. volume of Isovue-250 was injected and flow of contrast was noted at each level. Radiographs were obtained for documentation purposes.   After attaining the desired flow of contrast documented above, a 0.5 to 1.0 ml test  dose of 0.25% Marcaine was injected into each respective transforaminal space.  The patient was observed for 90 seconds post injection.  After no sensory deficits were reported, and normal lower extremity motor function was noted,   the above injectate was administered so that equal amounts of the injectate were placed at each foramen (level) into the transforaminal epidural space.   Additional Comments:  The patient tolerated the procedure well Dressing: 2 x 2 sterile gauze and Band-Aid    Post-procedure details: Patient was observed during the procedure. Post-procedure instructions were reviewed.  Patient left the clinic in stable condition.

## 2020-04-05 NOTE — Progress Notes (Signed)
Pt state right lower back pain. Pt state everything makes the pain worse. Pt state ice helps the pain. Pt hx of inj 07/08/2019  Numeric Pain Rating Scale and Functional Assessment Average Pain 10   In the last MONTH (on 0-10 scale) has pain interfered with the following?  1. General activity like being  able to carry out your everyday physical activities such as walking, climbing stairs, carrying groceries, or moving a chair?  Rating(10)   +Driver, -BT, -Dye Allergies.

## 2020-05-11 ENCOUNTER — Telehealth: Payer: Self-pay | Admitting: Family Medicine

## 2020-05-11 NOTE — Telephone Encounter (Signed)
Pt is calling to say she needs to do a urine test for pain management.   Please advise

## 2020-05-12 ENCOUNTER — Other Ambulatory Visit: Payer: Self-pay

## 2020-05-12 NOTE — Telephone Encounter (Signed)
Patient needs a pain management appointment. We can get the urine sample at her appointment.   ATC, unable to leave a voice mail.

## 2020-05-12 NOTE — Telephone Encounter (Signed)
Pt called back and an OV was set for 08/30 @ 1:15 for pt .

## 2020-05-12 NOTE — Telephone Encounter (Signed)
Noted. Nothing further needed. 

## 2020-05-15 ENCOUNTER — Encounter: Payer: Self-pay | Admitting: Family Medicine

## 2020-05-15 ENCOUNTER — Ambulatory Visit (INDEPENDENT_AMBULATORY_CARE_PROVIDER_SITE_OTHER): Payer: 59 | Admitting: Family Medicine

## 2020-05-15 ENCOUNTER — Other Ambulatory Visit: Payer: Self-pay

## 2020-05-15 VITALS — BP 162/100 | HR 115 | Temp 98.9°F | Wt 283.4 lb

## 2020-05-15 DIAGNOSIS — M5440 Lumbago with sciatica, unspecified side: Secondary | ICD-10-CM | POA: Diagnosis not present

## 2020-05-15 DIAGNOSIS — F1190 Opioid use, unspecified, uncomplicated: Secondary | ICD-10-CM

## 2020-05-15 DIAGNOSIS — L02421 Furuncle of right axilla: Secondary | ICD-10-CM

## 2020-05-15 DIAGNOSIS — G8929 Other chronic pain: Secondary | ICD-10-CM | POA: Diagnosis not present

## 2020-05-15 MED ORDER — HYDROMORPHONE HCL 4 MG PO TABS: 4.0000 mg | ORAL_TABLET | Freq: Four times a day (QID) | ORAL | 0 refills | Status: DC | PRN

## 2020-05-15 NOTE — Addendum Note (Signed)
Addended by: Marrion Coy on: 05/15/2020 02:04 PM   Modules accepted: Orders

## 2020-05-15 NOTE — Addendum Note (Signed)
Addended by: Marrion Coy on: 05/15/2020 02:05 PM   Modules accepted: Orders

## 2020-05-15 NOTE — Addendum Note (Signed)
Addended by: Marrion Coy on: 05/15/2020 02:07 PM   Modules accepted: Orders

## 2020-05-15 NOTE — Progress Notes (Signed)
   Subjective:    Patient ID: Dana [name], female    DOB: October 09, 1965, 54 y.o.   MRN: [id number]  HPI Here for pain management, she is doing well.  Indication for chronic opioid: knee and back pain  Medication and dose: Dilaudid 4 mg  # pills per month: 120 Last UDS date: 05-15-20 Opioid Treatment Agreement signed (Y/N): 12-19-17 Opioid Treatment Agreement last reviewed with patient:  05-15-20 [place] reviewed this encounter (include red flags): Yes    Review of Systems     Objective:   Physical Exam        Assessment & Plan:  Pain management , meds were refilled.  [name], MD

## 2020-05-17 ENCOUNTER — Telehealth: Payer: Self-pay | Admitting: Family Medicine

## 2020-05-17 LAB — DRUG MONITOR, PANEL 1, W/CONF, URINE
Amphetamines: NEGATIVE ng/mL (ref <500)
Barbiturates: NEGATIVE ng/mL (ref <300)
Benzodiazepines: NEGATIVE ng/mL (ref <100)
Cocaine Metabolite: NEGATIVE ng/mL (ref <150)
Codeine: NEGATIVE ng/mL (ref <50)
Creatinine: 217.1 mg/dL
Hydrocodone: NEGATIVE ng/mL (ref <50)
Hydromorphone: 3480 ng/mL — ABNORMAL HIGH (ref <50)
Marijuana Metabolite: NEGATIVE ng/mL (ref <20)
Methadone Metabolite: NEGATIVE ng/mL (ref <100)
Morphine: NEGATIVE ng/mL (ref <50)
Norhydrocodone: NEGATIVE ng/mL (ref <50)
Opiates: POSITIVE ng/mL — AB (ref <100)
Oxidant: NEGATIVE ug/mL
Oxycodone: NEGATIVE ng/mL (ref <100)
Phencyclidine: NEGATIVE ng/mL (ref <25)
pH: 8.1 (ref 4.5–9.0)

## 2020-05-17 LAB — DM TEMPLATE

## 2020-05-17 NOTE — Telephone Encounter (Signed)
Pt called to get her results of her lab she did on 8/30. Informed pt that we have not received the results yet but as soon as we do and the PCP reviews them we will reach out to her via telephone [phone]

## 2020-05-17 NOTE — Telephone Encounter (Signed)
Noted. I will contact the patient as soon as I receive her results.

## 2020-06-20 ENCOUNTER — Telehealth (INDEPENDENT_AMBULATORY_CARE_PROVIDER_SITE_OTHER): Payer: 59 | Admitting: Family Medicine

## 2020-06-20 ENCOUNTER — Encounter: Payer: Self-pay | Admitting: Family Medicine

## 2020-06-20 DIAGNOSIS — F4321 Adjustment disorder with depressed mood: Secondary | ICD-10-CM

## 2020-06-20 DIAGNOSIS — F411 Generalized anxiety disorder: Secondary | ICD-10-CM

## 2020-06-20 MED ORDER — ALPRAZOLAM 0.5 MG PO TABS: 0.5000 mg | ORAL_TABLET | Freq: Three times a day (TID) | ORAL | 2 refills | Status: DC | PRN

## 2020-06-20 NOTE — Progress Notes (Signed)
   Subjective:    Patient ID: Dana [name], female    DOB: [birth year], 54 y.o.   MRN: [id number]  HPI Virtual Visit via Telephone Note  I connected with the patient on 06/20/20 at  3:00 PM EDT by telephone and verified that I am speaking with the correct person using two identifiers.   I discussed the limitations, risks, security and privacy concerns of performing an evaluation and management service by telephone and the availability of in person appointments. I also discussed with the patient that there may be a patient responsible charge related to this service. The patient expressed understanding and agreed to proceed.  Location patient: home Location provider: work or home office Participants present for the call: patient, provider Patient did not have a visit in the prior 7 days to address this/these issue(s).   History of Present Illness: Here asking for help with anxiety. Her father-in-law has been battling cancer and he passed away this past weekend. Since then she has been very upset and anxious, she is tearful and has had a few "anxiety attacks". She has used Xanax successfully in the past.   Observations/Objective: Patient sounds cheerful and well on the phone. I do not appreciate any SOB. Speech and thought processing are grossly intact. Patient reported vitals:  Assessment and Plan: Grief reaction with anxiety. She can use Xanax as needed.  [name], MD   Follow Up Instructions:     [phone] 5-10 [phone] 11-20 [phone] 21-30 I did not refer this patient for an OV in the next 24 hours for this/these issue(s).  I discussed the assessment and treatment plan with the patient. The patient was provided an opportunity to ask questions and all were answered. The patient agreed with the plan and demonstrated an understanding of the instructions.   The patient was advised to call back or seek an in-person evaluation if the symptoms worsen or if the condition fails to  improve as anticipated.  I provided 11 minutes of non-face-to-face time during this encounter.   [name], MD    Review of Systems     Objective:   Physical Exam        Assessment & Plan:

## 2020-06-30 ENCOUNTER — Telehealth: Payer: Self-pay

## 2020-06-30 NOTE — Telephone Encounter (Signed)
Patient called she wants to schedule a appointment with Dr.Newton call back:[phone]

## 2020-06-30 NOTE — Telephone Encounter (Signed)
Patient had right L4 TF on 7/21. I called to ask if pain is the same and in the same area, and the patient stated that this was different. She states that the pain is in the middle and is burning. I asked if this was a new pain for her, and she stated that it was. I tried to schedule her for an OV since she states that problem is new. She asked if she would need a driver, and I advised that she would not since it was for an evaluation of different pain. She then stated that it is the same pain she has always had before. Please advise.

## 2020-07-03 NOTE — Telephone Encounter (Signed)
Ok to rpeat

## 2020-07-04 NOTE — Telephone Encounter (Signed)
Is auth needed for [phone]? Scheduled for 11/9 with driver.

## 2020-07-04 NOTE — Telephone Encounter (Signed)
Pt not req Auth#. 

## 2020-07-13 ENCOUNTER — Telehealth: Payer: Self-pay | Admitting: Family Medicine

## 2020-07-13 NOTE — Telephone Encounter (Signed)
Patient called crying says she is out of her Dilaudid and doesn't know what she will do tonight, because she can't take anything else. I advised I will call her back after [facility] [address] [facility] and spoke to [place] about them being out of stock. He says they have only 71 pills available and more will come in tomorrow. I asked if he could do a partial fill of her prescription until tomorrow. He says that he can refill 71 for $9.32, then tomorrow Dr. Sarajane Jews will need to send a new prescription with a quantity of 49 to make the 120 for October. He says to let the patient know that in 17 days she will be able to pick up 49, then there is the full prescription on file to fill next month. I called the patient and advised, she verbalized understanding.

## 2020-07-13 NOTE — Telephone Encounter (Signed)
HYDROmorphone (DILAUDID) 4 MG tablet    [id number] [address] [place] [address] Phone:  [phone]  Fax:  [phone]     The patient is completely out of her medication.  The [facility] and [facility] [address].

## 2020-07-14 MED ORDER — HYDROMORPHONE HCL 4 MG PO TABS: 4.0000 mg | ORAL_TABLET | Freq: Four times a day (QID) | ORAL | 0 refills | Status: AC | PRN

## 2020-07-14 NOTE — Telephone Encounter (Signed)
I sent in the remaining 49 pills

## 2020-07-14 NOTE — Addendum Note (Signed)
Addended by: [name] A on: 07/14/2020 03:30 PM   Modules accepted: Orders

## 2020-07-19 ENCOUNTER — Telehealth: Payer: Self-pay | Admitting: Specialist

## 2020-07-19 NOTE — Telephone Encounter (Signed)
Patient called advised her knee is so swollen she can not put any weight on it. Patient said the knee started swelling last Tuesday. Patient said her whole leg including her foot is swollen now.  The number to contact patient is [phone]

## 2020-07-20 ENCOUNTER — Telehealth: Payer: Self-pay | Admitting: Specialist

## 2020-07-20 NOTE — Telephone Encounter (Signed)
I called her, I tried to work her in for tomorrow morning at 9am, for her knee I advised that I would be working her in and she might have to wait a little and she states that she cant wait in the office, she said she would keep her appt for 07/27/20 w/ Jeneen Rinks, I did advise that if I got a cancellation I would call her back.

## 2020-07-20 NOTE — Telephone Encounter (Signed)
Pt called stating she can't wait until 11//11/21 to be seen for her for her swollen leg, I offered to let her speak with triage and she preferred to have a message sent in. Pt would like her appt moved up and a CB to update her on when she can come in  [phone]

## 2020-07-25 ENCOUNTER — Ambulatory Visit: Payer: 59 | Admitting: Physical Medicine and Rehabilitation

## 2020-07-27 ENCOUNTER — Ambulatory Visit: Payer: Self-pay

## 2020-07-27 ENCOUNTER — Other Ambulatory Visit: Payer: Self-pay

## 2020-07-27 ENCOUNTER — Encounter: Payer: Self-pay | Admitting: Surgery

## 2020-07-27 ENCOUNTER — Ambulatory Visit (INDEPENDENT_AMBULATORY_CARE_PROVIDER_SITE_OTHER): Payer: 59 | Admitting: Surgery

## 2020-07-27 DIAGNOSIS — M1712 Unilateral primary osteoarthritis, left knee: Secondary | ICD-10-CM

## 2020-07-27 MED ORDER — LIDOCAINE HCL 1 % IJ SOLN
3.0000 mL | INTRAMUSCULAR | Status: AC | PRN
  Administered 2020-07-27: 3 mL

## 2020-07-27 MED ORDER — BUPIVACAINE HCL 0.25 % IJ SOLN
6.0000 mL | INTRAMUSCULAR | Status: AC | PRN
  Administered 2020-07-27: 6 mL via INTRA_ARTICULAR

## 2020-07-27 NOTE — Progress Notes (Signed)
Office Visit Note   [name]           Date of Birth: 09-Apr-1966           MRN: [id number] Visit Date: 07/27/2020              Requested by: [name], MD [place],  [place] [address] PCP: [name], MD   Assessment & Plan: Visit Diagnoses:  1. Unilateral primary osteoarthritis, left knee     Plan: Labs give patient some improvement of her pain offered injection.  After patient consent left knee was prepped with Betadine and intra-articular injection was performed.  Follow-up in 6 weeks for recheck.  We may consider trying viscosupplementation.  Follow-Up Instructions: Return in about 6 weeks (around 09/07/2020).   Orders:  Orders Placed This Encounter  Procedures  . Large Joint Inj  . XR Knee 1-2 Views Left   No orders of the defined types were placed in this encounter.     Procedures: Large Joint Inj: L knee on 07/27/2020 4:26 PM Indications: pain Details: 22 G 1.5 in needle, anteromedial approach Medications: 3 mL lidocaine 1 %; 6 mL bupivacaine 0.25 % Outcome: tolerated well, no immediate complications  1 cc of betamethasone was used Consent was given by the patient. Patient was prepped and draped in the usual sterile fashion.       Clinical Data: No additional findings.   Subjective: Chief Complaint  Patient presents with  . Left Knee - Pain    HPI 54 year old black female comes in today with complaints of worsening left knee pain.  Patient has known history of left knee DJD.  Knee has been worse over the last couple weeks.  No injury.  Pain when she is weightbearing.  Has had some swelling.  She denies any history of gout.  Objective: Vital Signs: LMP 08/17/2012   Physical Exam Constitutional:      Appearance: She is obese.  Eyes:     Extraocular Movements: Extraocular movements intact.     Pupils: Pupils are equal, round, and reactive to light.  Pulmonary:     Effort: Pulmonary effort is normal.  No respiratory distress.  Musculoskeletal:     Comments: Knee range of motion about 0-100 agrees with discomfort.  Does have some swelling without large effusion.  Knee is diffusely tender more so at the medial joint line.  No signs of infection.  Calf nontender.  Neurovascular intact.  Neurological:     General: No focal deficit present.     Mental Status: She is oriented to person, place, and time.     Ortho Exam  Specialty Comments:  No specialty comments available.  Imaging: No results found.   PMFS History: Patient Active Problem List   Diagnosis Date Noted  . Carpal tunnel syndrome, left upper limb 05/11/2019    Class: Chronic  . Spinal stenosis of lumbar region with neurogenic claudication 08/20/2018  . 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 Primary osteoarthritis of right knee 02/27/2017  . Right tennis elbow 11/11/2016  . Muscle cramps 01/15/2016  . Bilateral leg edema 01/08/2016  . Radiculopathy 12/20/2015  . Hyperglycemia 12/21/2014  . Mastodynia, female 11/30/2014  . S/P excision of fibroadenoma of breast 11/30/2014  . Angioedema of lips 04/07/2014  . Fibroadenoma of right breast 03/07/2014  . Pelvic pain in female 06/19/2012  . Menorrhagia 06/11/2012  . SUI (stress urinary incontinence, female) 06/11/2012  . HTN (hypertension) 06/11/2012  .  IBS (irritable bowel syndrome) - diarrhea predominant 03/17/2012  . MICROSCOPIC HEMATURIA 11/30/2010  . BREAST PAIN, RIGHT 11/30/2010  . BREAST MASS, RIGHT 01/12/2010  . HYPERCHOLESTEROLEMIA 12/07/2007  . CIGARETTE SMOKER 12/07/2007  . DEGENERATIVE JOINT DISEASE 12/07/2007  . Low back pain with sciatica 12/07/2007  . HEADACHE 12/07/2007  . Obesity 08/03/2007  . Anxiety state 08/03/2007   Past Medical History:  Diagnosis Date  . Anxiety   . Back pain with radiation   . Breast mass, right   . Chronic pain   . Chronic, continuous use of opioids   . Complication of anesthesia 04/07/14   Allergic reaction to  Lisinopril immediately following surgery  . DJD (degenerative joint disease)   . Fibromyalgia   . Groin abscess   . Headache(784.0)   . History of IBS   . Hypercholesterolemia   . IBS (irritable bowel syndrome)   . Lactose intolerance   . Mild hypertension   . Obesity   . Tobacco use disorder   . Umbilical hernia    Symptomatic    Family History  Problem Relation Age of Onset  . Diabetes Father   . Diabetes Mother   . Prostate cancer Paternal Grandfather   . Breast cancer Maternal Aunt 46    Past Surgical History:  Procedure Laterality Date  . ABDOMINAL HYSTERECTOMY    . ANTERIOR CERVICAL DECOMP/DISCECTOMY FUSION N/A 12/20/2015   Procedure: ANTERIOR CERVICAL DECOMPRESSION FUSION CERVICAL 4-5, CERVICAL 5-6, CERVICAL 6-7 WITH INSTRUMENTATION AND ALLOGRAFT;  Surgeon: Phylliss Bob, MD;  Location: [facility];  Service: Orthopedics;  Laterality: N/A;  Anterior cervical decompression fusion, cervical 4-5, cervical 5-6, cervical 6-7 with instrumentation and allograft  . BACK SURGERY    . BILATERAL SALPINGECTOMY  09/03/2012   Procedure: BILATERAL SALPINGECTOMY;  Surgeon: [name], MD;  Location: [facility];  Service: Gynecology;  Laterality: Bilateral;  . BREAST BIOPSY Right 04/07/2014   Procedure: REMOVAL RIGHT BREAST MASS WITH WIRE LOCALIZATION;  Surgeon: [name], MD;  Location: [facility];  Service: General;  Laterality: Right;  . BREAST EXCISIONAL BIOPSY Left    x2  . BREAST LUMPECTOMY     x2  . CARPAL TUNNEL RELEASE Left 05/11/2019   Procedure: LEFT CARPAL TUNNEL RELEASE, RIGHT TENNIS ELBOW MARCAINE/DEPO MEDROL INJECTION UNDER ANESTHESIA;  Surgeon: Jessy Oto, MD;  Location: [facility];  Service: Orthopedics;  Laterality: Left;  . COLONOSCOPY W/ BIOPSIES  04/24/2012   per Dr. Carlean Purl, clear, repeat in 10 yrs   . disectomy    . ESOPHAGOGASTRODUODENOSCOPY    . FINGER SURGERY     Right index-excision of mass   . FOOT SURGERY Right    Bone Spurs  . LAPAROSCOPIC HYSTERECTOMY   09/03/2012   Procedure: HYSTERECTOMY TOTAL LAPAROSCOPIC;  Surgeon: [name], MD;  Location: [facility];  Service: Gynecology;  Laterality: N/A;  . LUMBAR [place]    . TUBAL LIGATION    . UMBILICAL HERNIA REPAIR N/A 05/01/2018   Procedure: UMBILICAL HERNIA REPAIR;  Surgeon: Judeth Horn, MD;  Location: [facility];  Service: General;  Laterality: N/A;   Social History   Occupational History  . Occupation: Optometrist  Tobacco Use  . Smoking status: Former Smoker    Years: 33.00    Types: Cigarettes  . Smokeless tobacco: Never Used  . Tobacco comment: quit March 2017  Vaping Use  . Vaping Use: Never used  Substance and Sexual Activity  . Alcohol use: Not Currently    Alcohol/week: 0.0 standard  drinks    Comment: occ  . Drug use: Yes    Comment: chronic use of dilaudid  . Sexual activity: Yes    Birth control/protection: Surgical

## 2020-08-24 ENCOUNTER — Ambulatory Visit: Admit: 2020-08-24 | Payer: 59 | Admitting: General Surgery

## 2020-08-25 ENCOUNTER — Other Ambulatory Visit: Payer: Self-pay | Admitting: General Surgery

## 2020-08-28 ENCOUNTER — Other Ambulatory Visit: Payer: Self-pay

## 2020-08-28 ENCOUNTER — Ambulatory Visit: Payer: Self-pay

## 2020-08-28 ENCOUNTER — Encounter: Payer: Self-pay | Admitting: Physical Medicine and Rehabilitation

## 2020-08-28 ENCOUNTER — Ambulatory Visit (INDEPENDENT_AMBULATORY_CARE_PROVIDER_SITE_OTHER): Payer: 59 | Admitting: Physical Medicine and Rehabilitation

## 2020-08-28 VITALS — BP 149/89 | HR 97

## 2020-08-28 DIAGNOSIS — M25552 Pain in left hip: Secondary | ICD-10-CM | POA: Diagnosis not present

## 2020-08-28 DIAGNOSIS — G894 Chronic pain syndrome: Secondary | ICD-10-CM | POA: Diagnosis not present

## 2020-08-28 DIAGNOSIS — M5416 Radiculopathy, lumbar region: Secondary | ICD-10-CM

## 2020-08-28 DIAGNOSIS — M48062 Spinal stenosis, lumbar region with neurogenic claudication: Secondary | ICD-10-CM

## 2020-08-28 MED ORDER — DEXAMETHASONE SODIUM PHOSPHATE 10 MG/ML IJ SOLN
15.0000 mg | Freq: Once | INTRAMUSCULAR | Status: AC
  Administered 2020-08-28: 15 mg

## 2020-08-28 NOTE — Progress Notes (Signed)
  Pt state lower back pain mostly the left side. Pt state that the pain traveled to her left groin area. Pt state walking and standing makes the pain worse. Pt laying down and pain meds helps ease the pain. Pt has hx of inj on 04/05/20 pt state it was okay and last for five weeks.  Numeric Pain Rating Scale and Functional Assessment Average Pain 8   In the last MONTH (on 0-10 scale) has pain interfered with the following?  1. General activity like being  able to carry out your everyday physical activities such as walking, climbing stairs, carrying groceries, or moving a chair?  Rating(10)   +Driver, -BT, -Dye Allergies.

## 2020-08-29 ENCOUNTER — Other Ambulatory Visit (HOSPITAL_COMMUNITY): Payer: 59

## 2020-08-31 SURGERY — CYST REMOVAL
Anesthesia: Monitor Anesthesia Care | Site: Axilla | Laterality: Right

## 2020-09-07 ENCOUNTER — Ambulatory Visit (INDEPENDENT_AMBULATORY_CARE_PROVIDER_SITE_OTHER): Payer: 59 | Admitting: Surgery

## 2020-09-07 ENCOUNTER — Telehealth: Payer: Self-pay

## 2020-09-07 ENCOUNTER — Encounter: Payer: Self-pay | Admitting: Surgery

## 2020-09-07 VITALS — BP 126/77 | HR 88 | Ht 64.0 in | Wt 298.0 lb

## 2020-09-07 DIAGNOSIS — M1711 Unilateral primary osteoarthritis, right knee: Secondary | ICD-10-CM | POA: Diagnosis not present

## 2020-09-07 DIAGNOSIS — M1712 Unilateral primary osteoarthritis, left knee: Secondary | ICD-10-CM | POA: Diagnosis not present

## 2020-09-07 MED ORDER — SODIUM HYALURONATE 60 MG/3ML IX PRSY
60.0000 mg | PREFILLED_SYRINGE | INTRA_ARTICULAR | Status: AC | PRN
  Administered 2020-09-07: 14:00:00 60 mg via INTRA_ARTICULAR

## 2020-09-07 MED ORDER — LIDOCAINE HCL 1 % IJ SOLN
3.0000 mL | INTRAMUSCULAR | Status: AC | PRN
  Administered 2020-09-07: 14:00:00 3 mL

## 2020-09-07 NOTE — Telephone Encounter (Signed)
Approved for Durolane-Left knee  Buy and Bill No copay 20% OOP No prior auth required

## 2020-09-07 NOTE — Progress Notes (Signed)
Office Visit Note   [name]           Date of Birth: 09-Mar-1966           MRN: [id number] Visit Date: 09/07/2020              Requested by: [name], MD [place],  [place] [address] PCP: [name], MD   Assessment & Plan: Visit Diagnoses:  1. Unilateral primary osteoarthritis, right knee   2. Unilateral primary osteoarthritis, left knee     Plan: In hopes of giving improvement of her left knee pain elected to proceed with viscosupplementation.  Patient sent left knee was prepped with Betadine and intra-articular Durolane injection was performed.  Tolerated without complication.  Patient understands again that ultimately may come down her needing definitive treatment with total knee replacement.  She will follow with me in 2 weeks for recheck and I will inject the right knee with Marcaine/Depo-Medrol.  Follow-Up Instructions: Return in about 2 weeks (around 09/21/2020) for With Jeneen Rinks for right knee Marcaine/Depo-Medrol injection.   Orders:  Orders Placed This Encounter  Procedures   Large Joint Inj   No orders of the defined types were placed in this encounter.     Procedures: Large Joint Inj: L knee on 09/07/2020 2:21 PM Indications: pain Details: 22 G 1.5 in needle, anteromedial approach Medications: 3 mL lidocaine 1 %; 60 mg Sodium Hyaluronate 60 MG/3ML Outcome: tolerated well, no immediate complications Consent was given by the patient. Patient was prepped and draped in the usual sterile fashion.       Clinical Data: No additional findings.   Subjective: Chief Complaint  Patient presents with   Left Knee - Pain   Right Knee - Pain    HPI 54 year old black female with known history of bilateral knee DJD returns for recheck of her left knee.  Last office visit left knee intra-articular Marcaine/Depo-Medrol injection was performed.  States that she did not have any improvement.  She would like to proceed with  viscosupplementation as previously discussed.  States right knee with she has known DJD is also been more symptomatic and is wanting a cortisone injection there. Review of Systems No current cardiac pulmonary GI GU issues  Objective: Vital Signs: BP 126/77    Pulse 88    Ht 5\' 4"  (1.626 m)    Wt 298 lb (135.2 kg)    LMP 08/17/2012    BMI 51.15 kg/m   Physical Exam Constitutional:      Appearance: She is obese.  HENT:     Head: Normocephalic.  Pulmonary:     Effort: No respiratory distress.  Musculoskeletal:     Comments: Gait is somewhat antalgic.  Left knee positive crepitus.  Joint line tender.  Some swelling without large effusion.  Neurovascular intact.  Neurological:     Mental Status: She is alert and oriented to person, place, and time.     Ortho Exam  Specialty Comments:  No specialty comments available.  Imaging: No results found.   PMFS History: Patient Active Problem List   Diagnosis Date Noted   Carpal tunnel syndrome, left upper limb 05/11/2019    Class: Chronic   Spinal stenosis of lumbar region with neurogenic claudication 08/20/2018   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   Primary osteoarthritis of right knee 02/27/2017   Right tennis elbow 11/11/2016   Muscle cramps 01/15/2016   Bilateral leg edema 01/08/2016   Radiculopathy 12/20/2015  Hyperglycemia 12/21/2014   Mastodynia, female 11/30/2014   S/P excision of fibroadenoma of breast 11/30/2014   Angioedema of lips 04/07/2014   Fibroadenoma of right breast 03/07/2014   Pelvic pain in female 06/19/2012   Menorrhagia 06/11/2012   SUI (stress urinary incontinence, female) 06/11/2012   HTN (hypertension) 06/11/2012   IBS (irritable bowel syndrome) - diarrhea predominant 03/17/2012   MICROSCOPIC HEMATURIA 11/30/2010   BREAST PAIN, RIGHT 11/30/2010   BREAST MASS, RIGHT 01/12/2010   HYPERCHOLESTEROLEMIA 12/07/2007   CIGARETTE SMOKER 12/07/2007   DEGENERATIVE JOINT  DISEASE 12/07/2007   Low back pain with sciatica 12/07/2007   HEADACHE 12/07/2007   Obesity 08/03/2007   Anxiety state 08/03/2007   Past Medical History:  Diagnosis Date   Anxiety    Back pain with radiation    Breast mass, right    Chronic pain    Chronic, continuous use of opioids    Complication of anesthesia 04/07/14   Allergic reaction to Lisinopril immediately following surgery   DJD (degenerative joint disease)    Fibromyalgia    Groin abscess    Headache(784.0)    History of IBS    Hypercholesterolemia    IBS (irritable bowel syndrome)    Lactose intolerance    Mild hypertension    Obesity    Tobacco use disorder    Umbilical hernia    Symptomatic    Family History  Problem Relation Age of Onset   Diabetes Father    Diabetes Mother    Prostate cancer Paternal Grandfather    Breast cancer Maternal Aunt 46    Past Surgical History:  Procedure Laterality Date   ABDOMINAL HYSTERECTOMY     ANTERIOR CERVICAL DECOMP/DISCECTOMY FUSION N/A 12/20/2015   Procedure: ANTERIOR CERVICAL DECOMPRESSION FUSION CERVICAL 4-5, CERVICAL 5-6, CERVICAL 6-7 WITH INSTRUMENTATION AND ALLOGRAFT;  Surgeon: Phylliss Bob, MD;  Location: [facility];  Service: Orthopedics;  Laterality: N/A;  Anterior cervical decompression fusion, cervical 4-5, cervical 5-6, cervical 6-7 with instrumentation and allograft   BACK SURGERY     BILATERAL SALPINGECTOMY  09/03/2012   Procedure: BILATERAL SALPINGECTOMY;  Surgeon: [name], MD;  Location: [facility];  Service: Gynecology;  Laterality: Bilateral;   BREAST BIOPSY Right 04/07/2014   Procedure: REMOVAL RIGHT BREAST MASS WITH WIRE LOCALIZATION;  Surgeon: [name], MD;  Location: [facility];  Service: General;  Laterality: Right;   BREAST EXCISIONAL BIOPSY Left    x2   BREAST LUMPECTOMY     x2   CARPAL TUNNEL RELEASE Left 05/11/2019   Procedure: LEFT CARPAL TUNNEL RELEASE, RIGHT TENNIS ELBOW MARCAINE/DEPO MEDROL INJECTION  UNDER ANESTHESIA;  Surgeon: Jessy Oto, MD;  Location: [facility];  Service: Orthopedics;  Laterality: Left;   COLONOSCOPY W/ BIOPSIES  04/24/2012   per Dr. Carlean Purl, clear, repeat in 10 yrs    disectomy     ESOPHAGOGASTRODUODENOSCOPY     FINGER SURGERY     Right index-excision of mass    FOOT SURGERY Right    Bone Spurs   LAPAROSCOPIC HYSTERECTOMY  09/03/2012   Procedure: HYSTERECTOMY TOTAL LAPAROSCOPIC;  Surgeon: [name], MD;  Location: [facility];  Service: Gynecology;  Laterality: N/A;   LUMBAR [place] SURGERY     TUBAL LIGATION     UMBILICAL HERNIA REPAIR N/A 05/01/2018   Procedure: UMBILICAL HERNIA REPAIR;  Surgeon: Judeth Horn, MD;  Location: [facility];  Service: General;  Laterality: N/A;   Social History   Occupational History   Occupation: Optometrist  Tobacco Use  Smoking status: Former Smoker    Years: 33.00    Types: Cigarettes   Smokeless tobacco: Never Used   Tobacco comment: quit March 2017  Vaping Use   Vaping Use: Never used  Substance and Sexual Activity   Alcohol use: Not Currently    Alcohol/week: 0.0 standard drinks    Comment: occ   Drug use: Yes    Comment: chronic use of dilaudid   Sexual activity: Yes    Birth control/protection: Surgical

## 2020-09-21 ENCOUNTER — Ambulatory Visit (INDEPENDENT_AMBULATORY_CARE_PROVIDER_SITE_OTHER): Payer: 59 | Admitting: Surgery

## 2020-09-21 ENCOUNTER — Encounter: Payer: Self-pay | Admitting: Surgery

## 2020-09-21 VITALS — BP 185/115 | HR 107 | Ht 64.0 in | Wt 298.0 lb

## 2020-09-21 DIAGNOSIS — M7542 Impingement syndrome of left shoulder: Secondary | ICD-10-CM | POA: Diagnosis not present

## 2020-09-21 DIAGNOSIS — M1711 Unilateral primary osteoarthritis, right knee: Secondary | ICD-10-CM | POA: Diagnosis not present

## 2020-09-21 MED ORDER — LIDOCAINE HCL 1 % IJ SOLN
3.0000 mL | INTRAMUSCULAR | Status: AC | PRN
Start: 2020-09-21 — End: 2020-09-21
  Administered 2020-09-21: 3 mL

## 2020-09-21 MED ORDER — BUPIVACAINE HCL 0.25 % IJ SOLN
6.0000 mL | INTRAMUSCULAR | Status: AC | PRN
  Administered 2020-09-21: 6 mL via INTRA_ARTICULAR

## 2020-09-21 NOTE — Progress Notes (Signed)
Office Visit Note   [name]           Date of Birth: 1966/06/17           MRN: [id number] Visit Date: 09/21/2020              Requested by: Nelwyn Salisbury, MD [address] [place],  [place] [address] PCP: Nelwyn Salisbury, MD   Assessment & Plan: Visit Diagnoses:  1. Arthritis of right knee   2. Impingement syndrome of left shoulder     Plan: Today I proceeded with doing right knee intra-articular Marcaine/betamethasone injection.  Again patient understands that ultimately may come down her needing surgical intervention with total knee replacements.  Have her follow-up with me in 3 weeks for recheck of both knees and her left shoulder.  If left shoulder continues to be symptomatic I will get x-ray at that time and possibly do subacromial Marcaine/Depo-Medrol injection.  Advised patient that since her pain to started I will give this some time to see if he gets better on its own.  Continue icing off and on as needed.  Follow-Up Instructions: Return in about 3 weeks (around 10/12/2020) for with Wayburn Shaler recheck knees and left shoulder.   Orders:  No orders of the defined types were placed in this encounter.  No orders of the defined types were placed in this encounter.     Procedures: Large Joint Inj: R knee on 09/21/2020 2:10 PM Indications: pain Details: 25 G 1.5 in needle, posterior approach Medications: 3 mL lidocaine 1 %; 6 mL bupivacaine 0.25 % Outcome: tolerated well, no immediate complications  6cc of Marcaine and 1 cc of betamethasone intra-articular injection Consent was given by the patient. Patient was prepped and draped in the usual sterile fashion.       Clinical Data: No additional findings.   Subjective: Chief Complaint  Patient presents with  . Right Knee - Pain  . Left Knee - Follow-up    HPI 55 year old black female history of right knee DJD comes in for Marcaine/steroid injection.  Left knee some improvement after previous  injection there.  Patient also states that she is been having left shoulder pain over the last few days.  Pain with overhead activity and when she lays on her left side at night.  No previous problems before onset.  Denies injury.  No cervical spine radicular component.    Objective: Vital Signs: BP (!) 185/115   Pulse (!) 107   Ht 5\' 4"  (1.626 m)   Wt 298 lb (135.2 kg)   LMP 08/17/2012   BMI 51.15 kg/m   Physical Exam Constitutional:      Appearance: She is obese.  HENT:     Head: Normocephalic and atraumatic.  Musculoskeletal:     Comments: Left shoulder flexion/abduction active to about 100 agrees.  Passively I can get her to about 100-120 degrees with discomfort.  Positive impingement test.  Negative drop arm test.  Neurological:     General: No focal deficit present.     Mental Status: She is oriented to person, place, and time.     Ortho Exam  Specialty Comments:  No specialty comments available.  Imaging: No results found.   PMFS History: Patient Active Problem List   Diagnosis Date Noted  . Carpal tunnel syndrome, left upper limb 05/11/2019    Class: Chronic  . Spinal stenosis of lumbar region with neurogenic claudication 08/20/2018  . 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  Primary osteoarthritis of right knee 02/27/2017  . Right tennis elbow 11/11/2016  . Muscle cramps 01/15/2016  . Bilateral leg edema 01/08/2016  . Radiculopathy 12/20/2015  . Hyperglycemia 12/21/2014  . Mastodynia, female 11/30/2014  . S/P excision of fibroadenoma of breast 11/30/2014  . Angioedema of lips 04/07/2014  . Fibroadenoma of right breast 03/07/2014  . Pelvic pain in female 06/19/2012  . Menorrhagia 06/11/2012  . SUI (stress urinary incontinence, female) 06/11/2012  . HTN (hypertension) 06/11/2012  . IBS (irritable bowel syndrome) - diarrhea predominant 03/17/2012  . MICROSCOPIC HEMATURIA 11/30/2010  . BREAST PAIN, RIGHT 11/30/2010  . BREAST MASS, RIGHT 01/12/2010   . HYPERCHOLESTEROLEMIA 12/07/2007  . CIGARETTE SMOKER 12/07/2007  . DEGENERATIVE JOINT DISEASE 12/07/2007  . Low back pain with sciatica 12/07/2007  . HEADACHE 12/07/2007  . Obesity 08/03/2007  . Anxiety state 08/03/2007   Past Medical History:  Diagnosis Date  . Anxiety   . Back pain with radiation   . Breast mass, right   . Chronic pain   . Chronic, continuous use of opioids   . Complication of anesthesia 04/07/14   Allergic reaction to Lisinopril immediately following surgery  . DJD (degenerative joint disease)   . Fibromyalgia   . Groin abscess   . Headache(784.0)   . History of IBS   . Hypercholesterolemia   . IBS (irritable bowel syndrome)   . Lactose intolerance   . Mild hypertension   . Obesity   . Tobacco use disorder   . Umbilical hernia    Symptomatic    Family History  Problem Relation Age of Onset  . Diabetes Father   . Diabetes Mother   . Prostate cancer Paternal Grandfather   . Breast cancer Maternal Aunt 46    Past Surgical History:  Procedure Laterality Date  . ABDOMINAL HYSTERECTOMY    . ANTERIOR CERVICAL DECOMP/DISCECTOMY FUSION N/A 12/20/2015   Procedure: ANTERIOR CERVICAL DECOMPRESSION FUSION CERVICAL 4-5, CERVICAL 5-6, CERVICAL 6-7 WITH INSTRUMENTATION AND ALLOGRAFT;  Surgeon: Phylliss Bob, MD;  Location: [facility];  Service: Orthopedics;  Laterality: N/A;  Anterior cervical decompression fusion, cervical 4-5, cervical 5-6, cervical 6-7 with instrumentation and allograft  . BACK SURGERY    . BILATERAL SALPINGECTOMY  09/03/2012   Procedure: BILATERAL SALPINGECTOMY;  Surgeon: [name], MD;  Location: [facility];  Service: Gynecology;  Laterality: Bilateral;  . BREAST BIOPSY Right 04/07/2014   Procedure: REMOVAL RIGHT BREAST MASS WITH WIRE LOCALIZATION;  Surgeon: [name], MD;  Location: [facility];  Service: General;  Laterality: Right;  . BREAST EXCISIONAL BIOPSY Left    x2  . BREAST LUMPECTOMY     x2  . CARPAL TUNNEL RELEASE Left 05/11/2019    Procedure: LEFT CARPAL TUNNEL RELEASE, RIGHT TENNIS ELBOW MARCAINE/DEPO MEDROL INJECTION UNDER ANESTHESIA;  Surgeon: Jessy Oto, MD;  Location: [facility];  Service: Orthopedics;  Laterality: Left;  . COLONOSCOPY W/ BIOPSIES  04/24/2012   per Dr. Carlean Purl, clear, repeat in 10 yrs   . disectomy    . ESOPHAGOGASTRODUODENOSCOPY    . FINGER SURGERY     Right index-excision of mass   . FOOT SURGERY Right    Bone Spurs  . LAPAROSCOPIC HYSTERECTOMY  09/03/2012   Procedure: HYSTERECTOMY TOTAL LAPAROSCOPIC;  Surgeon: [name], MD;  Location: [facility];  Service: Gynecology;  Laterality: N/A;  . LUMBAR [place]    . TUBAL LIGATION    . UMBILICAL HERNIA REPAIR N/A 05/01/2018   Procedure: UMBILICAL HERNIA REPAIR;  Surgeon:  Judeth Horn, MD;  Location: [facility];  Service: General;  Laterality: N/A;   Social History   Occupational History  . Occupation: Optometrist  Tobacco Use  . Smoking status: Former Smoker    Years: 33.00    Types: Cigarettes  . Smokeless tobacco: Never Used  . Tobacco comment: quit March 2017  Vaping Use  . Vaping Use: Never used  Substance and Sexual Activity  . Alcohol use: Not Currently    Alcohol/week: 0.0 standard drinks    Comment: occ  . Drug use: Yes    Comment: chronic use of dilaudid  . Sexual activity: Yes    Birth control/protection: Surgical

## 2020-09-22 ENCOUNTER — Other Ambulatory Visit: Payer: Self-pay | Admitting: Family Medicine

## 2020-09-22 MED ORDER — ALPRAZOLAM 0.5 MG PO TABS: 0.5000 mg | ORAL_TABLET | Freq: Three times a day (TID) | ORAL | 5 refills | Status: DC | PRN

## 2020-09-22 NOTE — Telephone Encounter (Signed)
Patient is calling and requesting a refill for ALPRAZolam (XANAX) 0.5 MG tablet sent to [facility] on [address], please advise. CB is [phone]

## 2020-10-04 ENCOUNTER — Telehealth: Payer: 59 | Admitting: Family Medicine

## 2020-10-05 ENCOUNTER — Encounter: Payer: Self-pay | Admitting: Family Medicine

## 2020-10-05 ENCOUNTER — Telehealth (INDEPENDENT_AMBULATORY_CARE_PROVIDER_SITE_OTHER): Payer: 59 | Admitting: Family Medicine

## 2020-10-05 DIAGNOSIS — M1711 Unilateral primary osteoarthritis, right knee: Secondary | ICD-10-CM

## 2020-10-05 DIAGNOSIS — M5440 Lumbago with sciatica, unspecified side: Secondary | ICD-10-CM | POA: Diagnosis not present

## 2020-10-05 DIAGNOSIS — G8929 Other chronic pain: Secondary | ICD-10-CM

## 2020-10-05 DIAGNOSIS — F1190 Opioid use, unspecified, uncomplicated: Secondary | ICD-10-CM

## 2020-10-05 MED ORDER — HYDROMORPHONE HCL 4 MG PO TABS: 4.0000 mg | ORAL_TABLET | Freq: Four times a day (QID) | ORAL | 0 refills | Status: DC | PRN

## 2020-10-05 MED ORDER — HYDROMORPHONE HCL 4 MG PO TABS: 4.0000 mg | ORAL_TABLET | Freq: Four times a day (QID) | ORAL | 0 refills | Status: AC | PRN

## 2020-10-05 NOTE — Progress Notes (Signed)
   Subjective:    Patient ID: Dana [name], female    DOB: Mar 17, 1966, 55 y.o.   MRN: [id number]  HPI Virtual Visit via Telephone Note  I connected with the patient on 10/05/20 at 10:15 AM EST by telephone and verified that I am speaking with the correct person using two identifiers.   I discussed the limitations, risks, security and privacy concerns of performing an evaluation and management service by telephone and the availability of in person appointments. I also discussed with the patient that there may be a patient responsible charge related to this service. The patient expressed understanding and agreed to proceed.  Location patient: home Location provider: work or home office Participants present for the call: patient, provider Patient did not have a visit in the prior 7 days to address this/these issue(s).   History of Present Illness: Here for pain management, she is doing well. She has received an ESI to the spine and 2 gel injections to the knees since we last spoke.  Indication for chronic opioid: knee and back pain Medication and dose: Dilaudid 4 mg  # pills per month: 120 Last UDS date: 05-15-20 Opioid Treatment Agreement signed (Y/N): 12-19-17 Opioid Treatment Agreement last reviewed with patient:  10-05-20 [facility] reviewed this encounter (include red flags): Yes    Observations/Objective: Patient sounds cheerful and well on the phone. I do not appreciate any SOB. Speech and thought processing are grossly intact. Patient reported vitals:  Assessment and Plan: Pain management, meds were refilled.  [name], MD   Follow Up Instructions:     [phone] 5-10 [phone] 11-20 [id number] 21-30 I did not refer this patient for an OV in the next 24 hours for this/these issue(s).  I discussed the assessment and treatment plan with the patient. The patient was provided an opportunity to ask questions and all were answered. The patient agreed with the plan and demonstrated an  understanding of the instructions.   The patient was advised to call back or seek an in-person evaluation if the symptoms worsen or if the condition fails to improve as anticipated.  I provided 11 minutes of non-face-to-face time during this encounter.   [name], MD    Review of Systems     Objective:   Physical Exam        Assessment & Plan:

## 2020-10-12 ENCOUNTER — Encounter: Payer: Self-pay | Admitting: Surgery

## 2020-10-12 ENCOUNTER — Ambulatory Visit (INDEPENDENT_AMBULATORY_CARE_PROVIDER_SITE_OTHER): Payer: 59 | Admitting: Surgery

## 2020-10-12 ENCOUNTER — Ambulatory Visit: Payer: Self-pay

## 2020-10-12 ENCOUNTER — Other Ambulatory Visit: Payer: Self-pay

## 2020-10-12 VITALS — BP 149/73 | HR 116 | Ht 64.0 in | Wt 298.0 lb

## 2020-10-12 DIAGNOSIS — M7542 Impingement syndrome of left shoulder: Secondary | ICD-10-CM

## 2020-10-12 MED ORDER — LIDOCAINE HCL 1 % IJ SOLN
3.0000 mL | INTRAMUSCULAR | Status: AC | PRN
  Administered 2020-10-12: 3 mL

## 2020-10-12 MED ORDER — BUPIVACAINE HCL 0.25 % IJ SOLN
4.0000 mL | INTRAMUSCULAR | Status: AC | PRN
  Administered 2020-10-12: 4 mL via INTRA_ARTICULAR

## 2020-10-12 NOTE — Progress Notes (Signed)
Office Visit Note   [name]           Date of Birth: 1965-12-07           MRN: [id number] Visit Date: 10/12/2020              Requested by: [name], MD [place],  [place] [id number] PCP: [name], MD   Assessment & Plan: Visit Diagnoses:  1. Impingement syndrome of left shoulder     Plan: In hopes to give patient some improvement of her shoulder pain offered injection.  After patient consent left shoulder was prepped with Betadine and subacromial Marcaine/betamethasone 4:1 injection performed.  After sitting for a few minutes patient did report good improvement and she had better range of motion with anesthetic in place.  Patient will follow up with me in 4 weeks for recheck.  I did ask her to call me in 3 weeks to let me know how she is feeling.  If she continues have ongoing pain in her shoulder I will plan to get MRI at that time.  In regards to bilateral knee pain she understands again at some point he would ultimately come down her needing definitive treatment with total knee replacements.  We could consider ongoing conservative management with injections.  Follow-Up Instructions: Return in about 4 weeks (around 11/09/2020) for with Jhanae Jaskowiak recheck left shoulder.   Orders:  Orders Placed This Encounter  Procedures  . XR Shoulder Left   No orders of the defined types were placed in this encounter.     Procedures: Large Joint Inj: L subacromial bursa on 10/12/2020 2:23 PM Indications: pain Details: 25 G 1.5 in needle, posterior approach Medications: 3 mL lidocaine 1 %; 4 mL bupivacaine 0.25 % Outcome: tolerated well, no immediate complications  1 cc of betamethasone was also used Consent was given by the patient. Patient was prepped and draped in the usual sterile fashion.       Clinical Data: No additional findings.   Subjective: Chief Complaint  Patient presents with  . Right Knee - Follow-up  . Left Knee -  Follow-up  . Left Shoulder - Follow-up    HPI 55 year old black female returns for recheck of her left shoulder pain and bilateral knee pain.  States that knee pain is about the same.  Again she understands that at some point will come down her needing definitive treatment with total knee replacements.  Left shoulder has been progressively getting worse over the last few weeks.  She has had chronic left shoulder pain but states that this was aggravated when she went to pick up her 72-year-old grandson and she felt increased pain in the left shoulder.  Pain with overhead activity and reaching behind her back.  Also bothers her at night when she lays on her left side. Review of Systems No current cardiac pulmonary GI GU  Objective: Vital Signs: BP (!) 149/73   Pulse (!) 116   Ht 5\' 4"  (1.626 m)   Wt 298 lb (135.2 kg)   LMP 08/17/2012   BMI 51.15 kg/m   Physical Exam Constitutional:      Appearance: She is obese.  HENT:     Head: Normocephalic.  Pulmonary:     Effort: No respiratory distress.  Musculoskeletal:     Comments: Exam left shoulder patient has marked positive impingement test.  Negative drop arm.  Active flexion/abduction to about 80 degrees with discomfort.   Neurological:  General: No focal deficit present.     Mental Status: She is alert and oriented to person, place, and time.     Ortho Exam  Specialty Comments:  No specialty comments available.  Imaging: XR Shoulder Left  Result Date: 10/12/2020 X-ray left shoulder shows subacromial spur.  Spurring of the greater tuberosity.  Moderate AC joint degenerative changes and spurring of the inferior distal clavicle.  Looks like patient has an area of calcific tendinopathy as well.    PMFS History: Patient Active Problem List   Diagnosis Date Noted  . Carpal tunnel syndrome, left upper limb 05/11/2019    Class: Chronic  . Spinal stenosis of lumbar region with neurogenic claudication 08/20/2018  . Congenital  deformity of finger 09/12/2017  . Primary osteoarthritis of right knee 02/27/2017  . Right tennis elbow 11/11/2016  . Muscle cramps 01/15/2016  . Bilateral leg edema 01/08/2016  . Radiculopathy 12/20/2015  . Hyperglycemia 12/21/2014  . Mastodynia, female 11/30/2014  . S/P excision of fibroadenoma of breast 11/30/2014  . Angioedema of lips 04/07/2014  . Fibroadenoma of right breast 03/07/2014  . Pelvic pain in female 06/19/2012  . Menorrhagia 06/11/2012  . SUI (stress urinary incontinence, female) 06/11/2012  . HTN (hypertension) 06/11/2012  . IBS (irritable bowel syndrome) - diarrhea predominant 03/17/2012  . MICROSCOPIC HEMATURIA 11/30/2010  . BREAST PAIN, RIGHT 11/30/2010  . BREAST MASS, RIGHT 01/12/2010  . HYPERCHOLESTEROLEMIA 12/07/2007  . CIGARETTE SMOKER 12/07/2007  . DEGENERATIVE JOINT DISEASE 12/07/2007  . Low back pain with sciatica 12/07/2007  . HEADACHE 12/07/2007  . Obesity 08/03/2007  . Anxiety state 08/03/2007   Past Medical History:  Diagnosis Date  . Anxiety   . Back pain with radiation   . Breast mass, right   . Chronic pain   . Chronic, continuous use of opioids   . Complication of anesthesia 04/07/14   Allergic reaction to Lisinopril immediately following surgery  . DJD (degenerative joint disease)   . Fibromyalgia   . Groin abscess   . Headache(784.0)   . History of IBS   . Hypercholesterolemia   . IBS (irritable bowel syndrome)   . Lactose intolerance   . Mild hypertension   . Obesity   . Tobacco use disorder   . Umbilical hernia    Symptomatic    Family History  Problem Relation Age of Onset  . Diabetes Father   . Diabetes Mother   . Prostate cancer Paternal Grandfather   . Breast cancer Maternal Aunt 46    Past Surgical History:  Procedure Laterality Date  . ABDOMINAL HYSTERECTOMY    . ANTERIOR CERVICAL DECOMP/DISCECTOMY FUSION N/A 12/20/2015   Procedure: ANTERIOR CERVICAL DECOMPRESSION FUSION CERVICAL 4-5, CERVICAL 5-6, CERVICAL 6-7  WITH INSTRUMENTATION AND ALLOGRAFT;  Surgeon: Phylliss Bob, MD;  Location: [facility];  Service: Orthopedics;  Laterality: N/A;  Anterior cervical decompression fusion, cervical 4-5, cervical 5-6, cervical 6-7 with instrumentation and allograft  . BACK SURGERY    . BILATERAL SALPINGECTOMY  09/03/2012   Procedure: BILATERAL SALPINGECTOMY;  Surgeon: [name], MD;  Location: [facility];  Service: Gynecology;  Laterality: Bilateral;  . BREAST BIOPSY Right 04/07/2014   Procedure: REMOVAL RIGHT BREAST MASS WITH WIRE LOCALIZATION;  Surgeon: [name], MD;  Location: [facility];  Service: General;  Laterality: Right;  . BREAST EXCISIONAL BIOPSY Left    x2  . BREAST LUMPECTOMY     x2  . CARPAL TUNNEL RELEASE Left 05/11/2019   Procedure: LEFT CARPAL TUNNEL RELEASE, RIGHT TENNIS  ELBOW MARCAINE/DEPO MEDROL INJECTION UNDER ANESTHESIA;  Surgeon: Jessy Oto, MD;  Location: [facility];  Service: Orthopedics;  Laterality: Left;  . COLONOSCOPY W/ BIOPSIES  04/24/2012   per Dr. Carlean Purl, clear, repeat in 10 yrs   . disectomy    . ESOPHAGOGASTRODUODENOSCOPY    . FINGER SURGERY     Right index-excision of mass   . FOOT SURGERY Right    Bone Spurs  . LAPAROSCOPIC HYSTERECTOMY  09/03/2012   Procedure: HYSTERECTOMY TOTAL LAPAROSCOPIC;  Surgeon: [name], MD;  Location: [facility];  Service: Gynecology;  Laterality: N/A;  . LUMBAR [place]    . TUBAL LIGATION    . UMBILICAL HERNIA REPAIR N/A 05/01/2018   Procedure: UMBILICAL HERNIA REPAIR;  Surgeon: Judeth Horn, MD;  Location: [facility];  Service: General;  Laterality: N/A;   Social History   Occupational History  . Occupation: Optometrist  Tobacco Use  . Smoking status: Former Smoker    Years: 33.00    Types: Cigarettes  . Smokeless tobacco: Never Used  . Tobacco comment: quit March 2017  Vaping Use  . Vaping Use: Never used  Substance and Sexual Activity  . Alcohol use: Not Currently    Alcohol/week: 0.0 standard drinks    Comment: occ   . Drug use: Yes    Comment: chronic use of dilaudid  . Sexual activity: Yes    Birth control/protection: Surgical

## 2020-10-26 ENCOUNTER — Telehealth: Payer: Self-pay | Admitting: Specialist

## 2020-10-26 ENCOUNTER — Encounter: Payer: Self-pay | Admitting: Physical Medicine and Rehabilitation

## 2020-10-26 DIAGNOSIS — M7542 Impingement syndrome of left shoulder: Secondary | ICD-10-CM

## 2020-10-26 NOTE — Procedures (Signed)
Lumbosacral Transforaminal Epidural Steroid Injection - Sub-Pedicular Approach with Fluoroscopic Guidance  [name]      Date of Birth: Nov 24, 1965 MRN: [id number] PCP: [name], MD      Visit Date: 08/28/2020   Universal Protocol:    Date/Time: 08/28/2020  Consent Given By: the patient  Position: PRONE  Additional Comments: Vital signs were monitored before and after the procedure. Patient was prepped and draped in the usual sterile fashion. The correct patient, procedure, and site was verified.   Injection Procedure Details:   Procedure diagnoses: Lumbar radiculopathy [M54.16]    Meds Administered:  Meds ordered this encounter  Medications  . dexamethasone (DECADRON) injection 15 mg    Laterality: Left  Location/Site:  L1-L2  Needle:5.0 in., 22 ga.  Short bevel or Quincke spinal needle  Needle Placement: Transforaminal  Findings:    -Comments: Excellent flow of contrast along the nerve, nerve root and into the epidural space.  Procedure Details: After squaring off the end-plates to get a true AP view, the C-arm was positioned so that an oblique view of the foramen as noted above was visualized. The target area is just inferior to the "nose of the scotty dog" or sub pedicular. The soft tissues overlying this structure were infiltrated with 2-3 ml. of 1% Lidocaine without Epinephrine.  The spinal needle was inserted toward the target using a "trajectory" view along the fluoroscope beam.  Under AP and lateral visualization, the needle was advanced so it did not puncture dura and was located close the 6 O'Clock position of the pedical in AP tracterory. Biplanar projections were used to confirm position. Aspiration was confirmed to be negative for CSF and/or blood. A 1-2 ml. volume of Isovue-250 was injected and flow of contrast was noted at each level. Radiographs were obtained for documentation purposes.   After attaining the desired flow of  contrast documented above, a 0.5 to 1.0 ml test dose of 0.25% Marcaine was injected into each respective transforaminal space.  The patient was observed for 90 seconds post injection.  After no sensory deficits were reported, and normal lower extremity motor function was noted,   the above injectate was administered so that equal amounts of the injectate were placed at each foramen (level) into the transforaminal epidural space.   Additional Comments:  The patient tolerated the procedure well Dressing: 2 x 2 sterile gauze and Band-Aid    Post-procedure details: Patient was observed during the procedure. Post-procedure instructions were reviewed.  Patient left the clinic in stable condition.

## 2020-10-26 NOTE — Telephone Encounter (Signed)
Please order left shoulder MRI rule out rotator cuff tear.  Schedule return office visit with Dr. Lorin Mercy to review study and discuss treatment options.  She is a [place] patient but he does not do shoulders.

## 2020-10-26 NOTE — Progress Notes (Signed)
Dana [name] - 55 y.o. female MRN [id number]  Date of birth: Mar 19, 1966  Office Visit Note: Visit Date: 08/28/2020 PCP: [name], MD Referred by: [name], MD  Subjective: Chief Complaint  Patient presents with  . Lower Back - Pain  . Left Leg - Pain   HPI:  Dana [name] is a 55 y.o. female who comes in today For evaluation management of chronic worsening left low back and left hip and groin pain.  Patient is typically followed by Dr. Basil Dess and has a history of cervical and lumbar surgery.  She is also seen Benjiman Core, PA-C recently for her knee and did not really complain about her hip and groin to him at least according to the notes.  The patient reports no new trauma or any other issues just continued chronic back pain chronic knee pain and now this low back and groin pain.  She has no history of prior hip replacement or prior hip injections.  She has had multiple joint injections of the knees and multiple injections of the spine.  Typically we completed a right L for transforaminal epidural steroid injection for severe stenosis at that level.  She has not had that level operated on.  The last time we saw her was in July.  She is had no focal weakness no bowel or bladder difficulties.  She is maintained by her primary care physician on opioid management chronically.  Her case is complicated by chronic pain syndrome, anxiety and morbid obesity.  She also has a history of swelling in the legs.  Review of Systems  Musculoskeletal: Positive for back pain and joint pain.  All other systems reviewed and are negative.  Otherwise per HPI.  Assessment & Plan: Visit Diagnoses:    ICD-10-CM   1. Lumbar radiculopathy  M54.16 XR C-ARM NO REPORT    Epidural Steroid injection    dexamethasone (DECADRON) injection 15 mg  2. Spinal stenosis of lumbar region with neurogenic claudication  M48.062 XR C-ARM NO REPORT    Epidural Steroid injection    dexamethasone (DECADRON)  injection 15 mg  3. Pain in left hip  M25.552   4. Chronic pain syndrome  G89.4     Plan: Findings:  Chronic worsening left low back pain with referral into the groin area without exam findings of pain on hip rotation although somewhat of a limited exam.  She does have MRI from 2019 showing worsening stenosis of the upper lumbar region particularly at T12-L1 and L1-2.  This would fit with L1 radicular pattern into the groin area.  The best approach is diagnostic L1 transforaminal epidural steroid injection.  Depending on relief she will follow up with Dr. Louanne Skye.  She continues to have severe stenosis at L4-5 but is not having any right-sided or bilateral symptoms consistent with that.  If she just does not get relief I would probably try 1 injection at the level of greater stenosis just to see if that helps we usually do not get that referral pattern.  Dr. Louanne Skye likely look at her hip depending on if there was no relief at all.    Meds & Orders:  Meds ordered this encounter  Medications  . dexamethasone (DECADRON) injection 15 mg    Orders Placed This Encounter  Procedures  . XR C-ARM NO REPORT  . Epidural Steroid injection    Follow-up: Return for visit to requesting physician as needed.   Procedures: No procedures performed  Lumbosacral  Transforaminal Epidural Steroid Injection - Sub-Pedicular Approach with Fluoroscopic Guidance  [name]      Date of Birth: 1966-04-06 MRN: [id number] PCP: [name], MD      Visit Date: 08/28/2020   Universal Protocol:    Date/Time: 08/28/2020  Consent Given By: the patient  Position: PRONE  Additional Comments: Vital signs were monitored before and after the procedure. Patient was prepped and draped in the usual sterile fashion. The correct patient, procedure, and site was verified.   Injection Procedure Details:   Procedure diagnoses: Lumbar radiculopathy [M54.16]    Meds Administered:  Meds ordered this  encounter  Medications  . dexamethasone (DECADRON) injection 15 mg    Laterality: Left  Location/Site:  L1-L2  Needle:5.0 in., 22 ga.  Short bevel or Quincke spinal needle  Needle Placement: Transforaminal  Findings:    -Comments: Excellent flow of contrast along the nerve, nerve root and into the epidural space.  Procedure Details: After squaring off the end-plates to get a true AP view, the C-arm was positioned so that an oblique view of the foramen as noted above was visualized. The target area is just inferior to the "nose of the scotty dog" or sub pedicular. The soft tissues overlying this structure were infiltrated with 2-3 ml. of 1% Lidocaine without Epinephrine.  The spinal needle was inserted toward the target using a "trajectory" view along the fluoroscope beam.  Under AP and lateral visualization, the needle was advanced so it did not puncture dura and was located close the 6 O'Clock position of the pedical in AP tracterory. Biplanar projections were used to confirm position. Aspiration was confirmed to be negative for CSF and/or blood. A 1-2 ml. volume of Isovue-250 was injected and flow of contrast was noted at each level. Radiographs were obtained for documentation purposes.   After attaining the desired flow of contrast documented above, a 0.5 to 1.0 ml test dose of 0.25% Marcaine was injected into each respective transforaminal space.  The patient was observed for 90 seconds post injection.  After no sensory deficits were reported, and normal lower extremity motor function was noted,   the above injectate was administered so that equal amounts of the injectate were placed at each foramen (level) into the transforaminal epidural space.   Additional Comments:  The patient tolerated the procedure well Dressing: 2 x 2 sterile gauze and Band-Aid    Post-procedure details: Patient was observed during the procedure. Post-procedure instructions were reviewed.  Patient left  the clinic in stable condition.      Clinical History: MRI LUMBAR SPINE WITHOUT CONTRAST  TECHNIQUE: Multiplanar, multisequence MR imaging of the lumbar spine was performed. No intravenous contrast was administered.  COMPARISON:  CT Abdomen and Pelvis 02/26/2018. Lumbar MRI 06/13/2017.  FINDINGS: Segmentation: Normal as seen on the comparison CT, which is the same numbering system used on the 2018 MRI.  Alignment: Stable vertebral height and alignment with mild straightening of lumbar lordosis. No spondylolisthesis.  Vertebrae: Chronic degenerative endplate marrow signal changes at L2-L3, sclerotic on the recent CT. Background bone marrow signal is within normal limits. No marrow edema or evidence of acute osseous abnormality. Intact visible sacrum and SI joints.  Conus medullaris and cauda equina: Conus extends to the L1 level. No lower spinal cord or conus signal abnormality.  Paraspinal and other soft tissues: Large body habitus. Stable visible abdominal viscera. Negative visualized posterior paraspinal soft tissues.  Disc levels:  T10-T11: Disc space loss with circumferential disc bulge.  Mild posterior element hypertrophy. Mild spinal stenosis and left T10 foraminal stenosis. Possible mild associated spinal cord mass effect, but no definite cord signal abnormality.  T11-T12: Broad-based posterior disc bulge or protrusion. Borderline to mild spinal stenosis.  T12-L1: Progressed broad-based posterior disc bulging since 2018. Mild facet hypertrophy and epidural lipomatosis. New mild spinal stenosis. No mass effect on the conus.  L1-L2: Progressed epidural lipomatosis. Chronic circumferential disc bulging which may also be mildly increased. Stable mild facet hypertrophy. Increased spinal stenosis, now mild to moderate. Mild left L1 foraminal stenosis is stable.  L2-L3: Chronic severe disc space loss. Chronic circumferential disc osteophyte complex.  Interval stable epidural lipomatosis and mild to moderate facet hypertrophy. Stable mild to moderate spinal stenosis. Stable mild right L2 foraminal stenosis.  L3-L4: Chronic circumferential but mostly far lateral disc bulging and moderate facet and ligament flavum hypertrophy. Stable epidural lipomatosis and mild to moderate spinal stenosis. Stable mild right L3 foraminal stenosis.  L4-L5: Chronic broad-based posterior disc bulge or protrusion and moderate to severe facet hypertrophy is stable. Chronic degenerative facet joint fluid. Chronic epidural lipomatosis. Stable severe spinal stenosis (series 5, image 23). No significant foraminal stenosis.  L5-S1: Chronic broad-based posterior disc bulge or protrusion appears stable along with moderate bilateral facet hypertrophy. Stable epidural lipomatosis and mild spinal stenosis.  IMPRESSION: 1. Progressed spinal stenosis since 2018 at T12-L1 and L1-L2, mild to moderate, and related to increased disc bulging and epidural lipomatosis. 2. Stable severe multifactorial spinal stenosis at L4-L5, and moderate spinal stenosis at L2-L3 and L3-L4. 3. Mild lower thoracic spinal stenosis with up to mild spinal cord mass effect at T10-T11, but no lower spinal cord or conus signal abnormality. 4.  No acute osseous abnormality.   Electronically Signed   By: Genevie Ann M.D.   On: 04/16/2018 15:43     Objective:  VS:  HT:    WT:   BMI:     BP:(!) 149/89  HR:97bpm  TEMP: ( )  RESP:  Physical Exam Vitals and nursing note reviewed.  Constitutional:      General: She is not in acute distress.    Appearance: Normal appearance. She is obese. She is not ill-appearing.  HENT:     Head: Normocephalic and atraumatic.     Right Ear: External ear normal.     Left Ear: External ear normal.  Eyes:     Extraocular Movements: Extraocular movements intact.  Cardiovascular:     Rate and Rhythm: Normal rate.     Pulses: Normal pulses.   Pulmonary:     Effort: Pulmonary effort is normal. No respiratory distress.  Abdominal:     General: There is no distension.     Palpations: Abdomen is soft.  Musculoskeletal:        General: Tenderness present.     Cervical back: Neck supple.     Right lower leg: No edema.     Left lower leg: No edema.     Comments: Patient has good distal strength with no pain over the greater trochanters.  No clonus or focal weakness.  She has some pain going from sit to stand with lumbar extension.  She has no pain with hip rotation and hips do seem to move fairly well.  Skin:    Findings: No erythema, lesion or rash.  Neurological:     General: No focal deficit present.     Mental Status: She is alert and oriented to person, place, and time.     Sensory:  No sensory deficit.     Motor: No weakness or abnormal muscle tone.     Coordination: Coordination normal.  Psychiatric:        Mood and Affect: Mood normal.        Behavior: Behavior normal.      Imaging: No results found.

## 2020-10-26 NOTE — Telephone Encounter (Signed)
MRI order entered. I called patient and advised. She will call once MRI is scheduled and schedule follow up for MRI review with Dr. Lorin Mercy.

## 2020-10-26 NOTE — Telephone Encounter (Signed)
Can you please ask Jeneen Rinks about ordering the MRI?

## 2020-10-26 NOTE — Telephone Encounter (Signed)
Patient called requesting PA Ricard Dillon or Dr. Louanne Skye send referral for MRI for left shoulder. Patient states it is no better after injection. Patient states PA Ricard Dillon stated for her to call with updates about shoulder pains. Please call patient about this matter at [phone].

## 2020-10-26 NOTE — Telephone Encounter (Signed)
Please advise 

## 2020-11-09 ENCOUNTER — Other Ambulatory Visit: Payer: Self-pay

## 2020-11-09 ENCOUNTER — Ambulatory Visit (INDEPENDENT_AMBULATORY_CARE_PROVIDER_SITE_OTHER): Payer: 59

## 2020-11-09 ENCOUNTER — Ambulatory Visit: Payer: 59 | Admitting: Surgery

## 2020-11-09 ENCOUNTER — Ambulatory Visit (INDEPENDENT_AMBULATORY_CARE_PROVIDER_SITE_OTHER): Payer: 59 | Admitting: Specialist

## 2020-11-09 ENCOUNTER — Encounter: Payer: Self-pay | Admitting: Specialist

## 2020-11-09 VITALS — BP 150/92 | HR 99 | Ht 64.0 in | Wt 298.0 lb

## 2020-11-09 DIAGNOSIS — M1711 Unilateral primary osteoarthritis, right knee: Secondary | ICD-10-CM | POA: Diagnosis not present

## 2020-11-09 DIAGNOSIS — Z6841 Body Mass Index (BMI) 40.0 and over, adult: Secondary | ICD-10-CM | POA: Diagnosis not present

## 2020-11-09 MED ORDER — TRAMADOL HCL ER 200 MG PO TB24: 200.0000 mg | ORAL_TABLET | Freq: Every day | ORAL | 1 refills | Status: DC

## 2020-11-09 NOTE — Patient Instructions (Addendum)
Plan: Knee is suffering from osteoarthritis, only real proven treatments are Weight loss, NSIADs like diclofenac and exercise. Well padded shoes help. Ice the knee out  is suffering from osteoarthritis, only real proven treatments are Weight loss, NSIADs like diclofenac and exercise. Well padded shoes help. Ice the knee 2-3 times a day 15-20 mins at a time.-3 times a day 15-20 mins at a time. Hot showers in the AM.  Injection with steroid may be of benefit. Hemp CBD capsules, [employer]on.[employer] 5,000-7,000 mg per bottle, 60 capsules per bottle, take one capsule twice a day. Cane in the left hand to use with left leg weight bearing. Follow-Up Instructions: No follow-ups on file.  Schedule an appointment with Dr. Colleen Can for consideration or right total knee replacement, I don't think she is going to be able to wait until BMI is perfect. MRI right knee assess for cause of right posterolateral severe pain r/o lateral mensical tear or AVN.

## 2020-11-09 NOTE — Progress Notes (Signed)
Office Visit Note   [name]           Date of Birth: 01/07/1966           MRN: [id number] Visit Date: 11/09/2020              Requested by: [name], MD [place],  [place] [address] PCP: [name], MD   Assessment & Plan: Visit Diagnoses:  1. Arthritis of right knee   2. Unilateral primary osteoarthritis, right knee     Plan: Plan: Knee is suffering from osteoarthritis, only real proven treatments are Weight loss, NSIADs like diclofenac and exercise. Well padded shoes help. Ice the knee out  is suffering from osteoarthritis, only real proven treatments are Weight loss, NSIADs like diclofenac and exercise. Well padded shoes help. Ice the knee 2-3 times a day 15-20 mins at a time.-3 times a day 15-20 mins at a time. Hot showers in the AM.  Injection with steroid may be of benefit. Hemp CBD capsules, amazon.com 5,000-7,000 mg per bottle, 60 capsules per bottle, take one capsule twice a day. Cane in the left hand to use with left leg weight bearing. Follow-Up Instructions: No follow-ups on file.  Schedule an appointment with Dr. Colleen Can for consideration or right total knee replacement, I don't think she is going to be able to wait until BMI is perfect. MRI right knee assess for cause of right posterolateral severe pain r/o lateral mensical tear or AVN.   Follow-Up Instructions: Return in about 3 weeks (around 11/30/2020).   Orders:  Orders Placed This Encounter  Procedures  . XR Knee 1-2 Views Right   No orders of the defined types were placed in this encounter.     Procedures: No procedures performed   Clinical Data: No additional findings.   Subjective: Chief Complaint  Patient presents with  . Right Knee - Pain    55 year old female with chronic bilateral knee pain has undergone repetitive cortisone injection and is at a point where she does not feel able to cope with the pain. She is having pain with flexion  and extension of the right knee. Pain with standing and with walking. Pain over the lateral posterior right knee. She is seen today with severe pain requesting total knee surgery and relates that her husband is planning to have major back surgery and she would like to be able to help him with rehabilitation.    Review of Systems  Constitutional: Negative.   HENT: Negative.   Eyes: Negative.   Respiratory: Negative.   Cardiovascular: Negative.   Gastrointestinal: Negative.   Endocrine: Negative.   Genitourinary: Negative.   Musculoskeletal: Negative.   Skin: Negative.   Allergic/Immunologic: Negative.   Neurological: Negative.   Hematological: Negative.   Psychiatric/Behavioral: Negative.      Objective: Vital Signs: BP (!) 150/92 (BP Location: Left Arm, Patient Position: Sitting)   Pulse 99   Ht 5\' 4"  (1.626 m)   Wt 298 lb (135.2 kg)   LMP 08/17/2012   BMI 51.15 kg/m   Physical Exam Constitutional:      Appearance: She is well-developed and well-nourished.  HENT:     Head: Normocephalic and atraumatic.  Eyes:     Extraocular Movements: EOM normal.     Pupils: Pupils are equal, round, and reactive to light.  Pulmonary:     Effort: Pulmonary effort is normal.     Breath sounds: Normal breath sounds.  Abdominal:     General: Bowel sounds are normal.     Palpations: Abdomen is soft.  Musculoskeletal:     Cervical back: Normal range of motion and neck supple.     Right knee: No effusion.     Left knee: No effusion.     Instability Tests: Medial McMurray test negative and lateral McMurray test negative.  Skin:    General: Skin is warm and dry.  Neurological:     Mental Status: She is alert and oriented to person, place, and time.  Psychiatric:        Mood and Affect: Mood and affect normal.        Behavior: Behavior normal.        Thought Content: Thought content normal.        Judgment: Judgment normal.     Right Knee Exam   Tests  Lachman:  Anterior -  negative    Posterior - negative Drawer:  Anterior - negative    Posterior - negative  Other  Erythema: absent Scars: absent Effusion: no effusion present   Left Knee Exam   Tenderness  The patient is experiencing tenderness in the lateral joint line and lateral retinaculum.  Range of Motion  Extension: abnormal  Flexion: abnormal   Tests  McMurray:  Medial - negative Lateral - negative Varus: negative Valgus: negative Lachman:  Anterior - negative    Posterior - negative Drawer:  Anterior - negative     Posterior - negative  Other  Erythema: absent Scars: absent Effusion: no effusion present      Specialty Comments:  No specialty comments available.  Imaging: No results found.   PMFS History: Patient Active Problem List   Diagnosis Date Noted  . Carpal tunnel syndrome, left upper limb 05/11/2019    Priority: High    Class: Chronic  . Spinal stenosis of lumbar region with neurogenic claudication 08/20/2018  . 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 Primary osteoarthritis of right knee 02/27/2017  . Right tennis elbow 11/11/2016  . Muscle cramps 01/15/2016  . Bilateral leg edema 01/08/2016  . Radiculopathy 12/20/2015  . Hyperglycemia 12/21/2014  . Mastodynia, female 11/30/2014  . S/P excision of fibroadenoma of breast 11/30/2014  . Angioedema of lips 04/07/2014  . Fibroadenoma of right breast 03/07/2014  . Pelvic pain in female 06/19/2012  . Menorrhagia 06/11/2012  . SUI (stress urinary incontinence, female) 06/11/2012  . HTN (hypertension) 06/11/2012  . IBS (irritable bowel syndrome) - diarrhea predominant 03/17/2012  . MICROSCOPIC HEMATURIA 11/30/2010  . BREAST PAIN, RIGHT 11/30/2010  . BREAST MASS, RIGHT 01/12/2010  . HYPERCHOLESTEROLEMIA 12/07/2007  . CIGARETTE SMOKER 12/07/2007  . DEGENERATIVE JOINT DISEASE 12/07/2007  . Low back pain with sciatica 12/07/2007  . HEADACHE 12/07/2007  . Obesity 08/03/2007  . Anxiety state 08/03/2007    Past Medical History:  Diagnosis Date  . Anxiety   . Back pain with radiation   . Breast mass, right   . Chronic pain   . Chronic, continuous use of opioids   . Complication of anesthesia 04/07/14   Allergic reaction to Lisinopril immediately following surgery  . DJD (degenerative joint disease)   . Fibromyalgia   . Groin abscess   . Headache(784.0)   . History of IBS   . Hypercholesterolemia   . IBS (irritable bowel syndrome)   . Lactose intolerance   . Mild hypertension   . Obesity   . Tobacco use disorder   . Umbilical hernia  Symptomatic    Family History  Problem Relation Age of Onset  . Diabetes Father   . Diabetes Mother   . Prostate cancer Paternal Grandfather   . Breast cancer Maternal Aunt 46    Past Surgical History:  Procedure Laterality Date  . ABDOMINAL HYSTERECTOMY    . ANTERIOR CERVICAL DECOMP/DISCECTOMY FUSION N/A 12/20/2015   Procedure: ANTERIOR CERVICAL DECOMPRESSION FUSION CERVICAL 4-5, CERVICAL 5-6, CERVICAL 6-7 WITH INSTRUMENTATION AND ALLOGRAFT;  Surgeon: Phylliss Bob, MD;  Location: [facility];  Service: Orthopedics;  Laterality: N/A;  Anterior cervical decompression fusion, cervical 4-5, cervical 5-6, cervical 6-7 with instrumentation and allograft  . BACK SURGERY    . BILATERAL SALPINGECTOMY  09/03/2012   Procedure: BILATERAL SALPINGECTOMY;  Surgeon: [name], MD;  Location: [facility];  Service: Gynecology;  Laterality: Bilateral;  . BREAST BIOPSY Right 04/07/2014   Procedure: REMOVAL RIGHT BREAST MASS WITH WIRE LOCALIZATION;  Surgeon: [name], MD;  Location: [facility];  Service: General;  Laterality: Right;  . BREAST EXCISIONAL BIOPSY Left    x2  . BREAST LUMPECTOMY     x2  . CARPAL TUNNEL RELEASE Left 05/11/2019   Procedure: LEFT CARPAL TUNNEL RELEASE, RIGHT TENNIS ELBOW MARCAINE/DEPO MEDROL INJECTION UNDER ANESTHESIA;  Surgeon: Jessy Oto, MD;  Location: [facility];  Service: Orthopedics;  Laterality: Left;  . COLONOSCOPY W/ BIOPSIES   04/24/2012   per Dr. Carlean Purl, clear, repeat in 10 yrs   . disectomy    . ESOPHAGOGASTRODUODENOSCOPY    . FINGER SURGERY     Right index-excision of mass   . FOOT SURGERY Right    Bone Spurs  . LAPAROSCOPIC HYSTERECTOMY  09/03/2012   Procedure: HYSTERECTOMY TOTAL LAPAROSCOPIC;  Surgeon: [name], MD;  Location: [facility];  Service: Gynecology;  Laterality: N/A;  . LUMBAR [place]    . TUBAL LIGATION    . UMBILICAL HERNIA REPAIR N/A 05/01/2018   Procedure: UMBILICAL HERNIA REPAIR;  Surgeon: Judeth Horn, MD;  Location: [facility];  Service: General;  Laterality: N/A;   Social History   Occupational History  . Occupation: Optometrist  Tobacco Use  . Smoking status: Former Smoker    Years: 33.00    Types: Cigarettes  . Smokeless tobacco: Never Used  . Tobacco comment: quit March 2017  Vaping Use  . Vaping Use: Never used  Substance and Sexual Activity  . Alcohol use: Not Currently    Alcohol/week: 0.0 standard drinks    Comment: occ  . Drug use: Yes    Comment: chronic use of dilaudid  . Sexual activity: Yes    Birth control/protection: Surgical

## 2020-11-13 ENCOUNTER — Other Ambulatory Visit: Payer: Self-pay

## 2020-11-13 ENCOUNTER — Ambulatory Visit
Admission: RE | Admit: 2020-11-13 | Discharge: 2020-11-13 | Disposition: A | Discharge status: Home / Self Care | Payer: 59 | Source: Ambulatory Visit | Attending: Surgery | Admitting: Surgery

## 2020-11-13 DIAGNOSIS — M7542 Impingement syndrome of left shoulder: Secondary | ICD-10-CM

## 2020-11-17 NOTE — Progress Notes (Signed)
I called and sched her for Monday 11/20/20 @ 1030 to see Dr. Marlou Sa

## 2020-11-20 ENCOUNTER — Ambulatory Visit: Payer: 59 | Admitting: Orthopedic Surgery

## 2020-11-23 ENCOUNTER — Ambulatory Visit
Admission: RE | Admit: 2020-11-23 | Discharge: 2020-11-23 | Disposition: A | Discharge status: Home / Self Care | Payer: 59 | Source: Ambulatory Visit | Attending: Specialist | Admitting: Specialist

## 2020-11-23 ENCOUNTER — Other Ambulatory Visit: Payer: Self-pay

## 2020-11-23 DIAGNOSIS — M1711 Unilateral primary osteoarthritis, right knee: Secondary | ICD-10-CM

## 2020-11-28 ENCOUNTER — Other Ambulatory Visit: Payer: 59

## 2020-11-30 ENCOUNTER — Ambulatory Visit (INDEPENDENT_AMBULATORY_CARE_PROVIDER_SITE_OTHER): Payer: 59 | Admitting: Orthopedic Surgery

## 2020-11-30 ENCOUNTER — Other Ambulatory Visit: Payer: Self-pay

## 2020-11-30 DIAGNOSIS — M12812 Other specific arthropathies, not elsewhere classified, left shoulder: Secondary | ICD-10-CM | POA: Diagnosis not present

## 2020-11-30 DIAGNOSIS — M75102 Unspecified rotator cuff tear or rupture of left shoulder, not specified as traumatic: Secondary | ICD-10-CM | POA: Diagnosis not present

## 2020-12-01 ENCOUNTER — Encounter: Payer: Self-pay | Admitting: Orthopedic Surgery

## 2020-12-01 NOTE — Progress Notes (Signed)
Office Visit Note   [name]           Date of Birth: Jan 23, 1966           MRN: [id number] Visit Date: 11/30/2020 Requested by: [name], MD [place],  [place] [address] PCP: [name], MD  Subjective: Chief Complaint  Patient presents with  . Left Shoulder - Follow-up    HPI: Dana [name] is a 55 year old patient with left shoulder pain.  Since she was last seen she has had an MRI scan which shows retracted supraspinatus tear to the glenoid rim as well as severe infraspinatus atrophy.  No spinoglenoid notch cyst and biceps tendon is out of the joint.  Injections have been helpful.  Not really due for another injection until May.              ROS: All systems reviewed are negative as they relate to the chief complaint within the history of present illness.  Patient denies  fevers or chills.   Assessment & Plan: Visit Diagnoses:  1. Rotator cuff tear arthropathy of left shoulder     Plan: Impression is fairly functional shoulder on the left-hand side but with significant posterior superior rotator cuff deficiency.  Not too much arthritis in the shoulder at this time.  I think should her symptoms become severe in terms of weakness more than pain she would be a reasonable candidate for lower trapezius tendon transfer.  In the meantime she wants to continue with injections.  We will do that in May and reassess her clinically.  Follow-Up Instructions: No follow-ups on file.   Orders:  No orders of the defined types were placed in this encounter.  No orders of the defined types were placed in this encounter.     Procedures: No procedures performed   Clinical Data: No additional findings.  Objective: Vital Signs: LMP 08/17/2012   Physical Exam:   Constitutional: Patient appears well-developed HEENT:  Head: Normocephalic Eyes:EOM are normal Neck: Normal range of motion Cardiovascular: Normal rate Pulmonary/chest: Effort  normal Neurologic: Patient is alert Skin: Skin is warm Psychiatric: Patient has normal mood and affect    Ortho Exam: Ortho exam demonstrates good cervical spine range of motion.  5 out of 5 grip EPL FPL interosseous wrist flexion extension bicep triceps and deltoid strength.  Cervical spine range of motion full with flexion extension and rotation.  She does have some weakness to infraspinatus and supraspinatus testing but not as much as you would expect based on the MRI scan reviewed.  Subscap strength is intact.  No definite Popeye deformity present due to contour of the arm.  Radial pulse intact bilaterally.  She does have some crepitus with internal/external rotation of that left arm.  Specialty Comments:  No specialty comments available.  Imaging: No results found.   PMFS History: Patient Active Problem List   Diagnosis Date Noted  . Carpal tunnel syndrome, left upper limb 05/11/2019    Class: Chronic  . Spinal stenosis of lumbar region with neurogenic claudication 08/20/2018  . 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 Primary osteoarthritis of right knee 02/27/2017  . Right tennis elbow 11/11/2016  . Muscle cramps 01/15/2016  . Bilateral leg edema 01/08/2016  . Radiculopathy 12/20/2015  . Hyperglycemia 12/21/2014  . Mastodynia, female 11/30/2014  . S/P excision of fibroadenoma of breast 11/30/2014  . Angioedema of lips 04/07/2014  . Fibroadenoma of right breast 03/07/2014  . Pelvic pain in female  06/19/2012  . Menorrhagia 06/11/2012  . SUI (stress urinary incontinence, female) 06/11/2012  . HTN (hypertension) 06/11/2012  . IBS (irritable bowel syndrome) - diarrhea predominant 03/17/2012  . MICROSCOPIC HEMATURIA 11/30/2010  . BREAST PAIN, RIGHT 11/30/2010  . BREAST MASS, RIGHT 01/12/2010  . HYPERCHOLESTEROLEMIA 12/07/2007  . CIGARETTE SMOKER 12/07/2007  . DEGENERATIVE JOINT DISEASE 12/07/2007  . Low back pain with sciatica 12/07/2007  . HEADACHE 12/07/2007  .  Obesity 08/03/2007  . Anxiety state 08/03/2007   Past Medical History:  Diagnosis Date  . Anxiety   . Back pain with radiation   . Breast mass, right   . Chronic pain   . Chronic, continuous use of opioids   . Complication of anesthesia 04/07/14   Allergic reaction to Lisinopril immediately following surgery  . DJD (degenerative joint disease)   . Fibromyalgia   . Groin abscess   . Headache(784.0)   . History of IBS   . Hypercholesterolemia   . IBS (irritable bowel syndrome)   . Lactose intolerance   . Mild hypertension   . Obesity   . Tobacco use disorder   . Umbilical hernia    Symptomatic    Family History  Problem Relation Age of Onset  . Diabetes Father   . Diabetes Mother   . Prostate cancer Paternal Grandfather   . Breast cancer Maternal Aunt 46    Past Surgical History:  Procedure Laterality Date  . ABDOMINAL HYSTERECTOMY    . ANTERIOR CERVICAL DECOMP/DISCECTOMY FUSION N/A 12/20/2015   Procedure: ANTERIOR CERVICAL DECOMPRESSION FUSION CERVICAL 4-5, CERVICAL 5-6, CERVICAL 6-7 WITH INSTRUMENTATION AND ALLOGRAFT;  Surgeon: Phylliss Bob, MD;  Location: [facility];  Service: Orthopedics;  Laterality: N/A;  Anterior cervical decompression fusion, cervical 4-5, cervical 5-6, cervical 6-7 with instrumentation and allograft  . BACK SURGERY    . BILATERAL SALPINGECTOMY  09/03/2012   Procedure: BILATERAL SALPINGECTOMY;  Surgeon: [name], MD;  Location: [facility];  Service: Gynecology;  Laterality: Bilateral;  . BREAST BIOPSY Right 04/07/2014   Procedure: REMOVAL RIGHT BREAST MASS WITH WIRE LOCALIZATION;  Surgeon: [name], MD;  Location: [facility];  Service: General;  Laterality: Right;  . BREAST EXCISIONAL BIOPSY Left    x2  . BREAST LUMPECTOMY     x2  . CARPAL TUNNEL RELEASE Left 05/11/2019   Procedure: LEFT CARPAL TUNNEL RELEASE, RIGHT TENNIS ELBOW MARCAINE/DEPO MEDROL INJECTION UNDER ANESTHESIA;  Surgeon: Jessy Oto, MD;  Location: [facility];  Service: Orthopedics;   Laterality: Left;  . COLONOSCOPY W/ BIOPSIES  04/24/2012   per Dr. Carlean Purl, clear, repeat in 10 yrs   . disectomy    . ESOPHAGOGASTRODUODENOSCOPY    . FINGER SURGERY     Right index-excision of mass   . FOOT SURGERY Right    Bone Spurs  . LAPAROSCOPIC HYSTERECTOMY  09/03/2012   Procedure: HYSTERECTOMY TOTAL LAPAROSCOPIC;  Surgeon: [name], MD;  Location: [facility];  Service: Gynecology;  Laterality: N/A;  . LUMBAR [place]    . TUBAL LIGATION    . UMBILICAL HERNIA REPAIR N/A 05/01/2018   Procedure: UMBILICAL HERNIA REPAIR;  Surgeon: Judeth Horn, MD;  Location: [facility];  Service: General;  Laterality: N/A;   Social History   Occupational History  . Occupation: Optometrist  Tobacco Use  . Smoking status: Former Smoker    Years: 33.00    Types: Cigarettes  . Smokeless tobacco: Never Used  . Tobacco comment: quit March 2017  Vaping Use  . Vaping  Use: Never used  Substance and Sexual Activity  . Alcohol use: Not Currently    Alcohol/week: 0.0 standard drinks    Comment: occ  . Drug use: Yes    Comment: chronic use of dilaudid  . Sexual activity: Yes    Birth control/protection: Surgical

## 2020-12-04 ENCOUNTER — Telehealth: Payer: Self-pay | Admitting: Physical Medicine and Rehabilitation

## 2020-12-04 NOTE — Telephone Encounter (Signed)
Led and would like to get an injection in her lower back. Can you call her and get her scheduled [phone]

## 2020-12-04 NOTE — Telephone Encounter (Signed)
ok 

## 2020-12-04 NOTE — Telephone Encounter (Signed)
Left L1 TF 08/28/20. Ok to repeat if helped, same problem/side, and no new injury?

## 2020-12-05 NOTE — Telephone Encounter (Signed)
Scheduled for 4/4 at 1345 with driver.

## 2020-12-08 ENCOUNTER — Ambulatory Visit: Payer: 59 | Admitting: Specialist

## 2020-12-18 ENCOUNTER — Ambulatory Visit: Payer: 59 | Admitting: Physical Medicine and Rehabilitation

## 2020-12-20 ENCOUNTER — Other Ambulatory Visit: Payer: Self-pay

## 2020-12-20 ENCOUNTER — Encounter: Payer: Self-pay | Admitting: Specialist

## 2020-12-20 ENCOUNTER — Ambulatory Visit (INDEPENDENT_AMBULATORY_CARE_PROVIDER_SITE_OTHER): Payer: 59 | Admitting: Specialist

## 2020-12-20 ENCOUNTER — Telehealth: Payer: Self-pay | Admitting: Specialist

## 2020-12-20 VITALS — BP 95/65 | HR 76 | Ht 64.0 in | Wt 298.0 lb

## 2020-12-20 DIAGNOSIS — M75102 Unspecified rotator cuff tear or rupture of left shoulder, not specified as traumatic: Secondary | ICD-10-CM

## 2020-12-20 DIAGNOSIS — M1711 Unilateral primary osteoarthritis, right knee: Secondary | ICD-10-CM

## 2020-12-20 DIAGNOSIS — M48062 Spinal stenosis, lumbar region with neurogenic claudication: Secondary | ICD-10-CM | POA: Diagnosis not present

## 2020-12-20 DIAGNOSIS — Z6841 Body Mass Index (BMI) 40.0 and over, adult: Secondary | ICD-10-CM | POA: Diagnosis not present

## 2020-12-20 DIAGNOSIS — M12812 Other specific arthropathies, not elsewhere classified, left shoulder: Secondary | ICD-10-CM

## 2020-12-20 MED ORDER — DICLOFENAC SODIUM 75 MG PO TBEC: 75.0000 mg | DELAYED_RELEASE_TABLET | Freq: Two times a day (BID) | ORAL | 2 refills | Status: DC

## 2020-12-20 MED ORDER — BUPIVACAINE HCL 0.25 % IJ SOLN
4.0000 mL | INTRAMUSCULAR | Status: AC | PRN
  Administered 2020-12-20: 4 mL via INTRA_ARTICULAR

## 2020-12-20 MED ORDER — METHYLPREDNISOLONE ACETATE 40 MG/ML IJ SUSP
40.0000 mg | INTRAMUSCULAR | Status: AC | PRN
  Administered 2020-12-20: 40 mg via INTRA_ARTICULAR

## 2020-12-20 NOTE — Telephone Encounter (Signed)
Patient called asked if the Rx was sent to the pharmacy yet. Patient asked for a call back when the Rx has been sent to the pharmacy. The number to contact patient is [phone]

## 2020-12-20 NOTE — Patient Instructions (Signed)
Plan: Knee is suffering from osteoarthritis, only real proven treatments are Weight loss, NSIADs like diclofenac and exercise. Well padded shoes help. Ice the knee that is suffering from osteoarthritis, only real proven treatments are Weight loss, NSIADs like diclofenac and exercise. Well padded shoes help. Ice the knee 2-3 times a day 15-20 mins at a time.-3 times a day 15-20 mins at a time. Hot showers in the AM.  Injection with steroid may be of benefit. Hemp CBD capsules, amazon.com 5,000-7,000 mg per bottle, 60 capsules per bottle, take one capsule twice a day. Cane in the left hand to use with left leg weight bearing. Follow-Up Instructions: No follow-ups on file.   Follow-Up Instructions: Return in about 4 weeks (around 01/17/2021) for Schedule an appointment to see Dr. Marlou Sa for consideration of eval for eventual  TKR.

## 2020-12-20 NOTE — Telephone Encounter (Signed)
I called and advised that Her rx was sent to [facility] @ 11:07

## 2020-12-20 NOTE — Progress Notes (Signed)
Office Visit Note   [name]           Date of Birth: 12/23/1965           MRN: [id number] Visit Date: 12/20/2020              Requested by: [name], MD [place],  [place] [address] PCP: [name], MD   Assessment & Plan: Visit Diagnoses:  1. Rotator cuff tear arthropathy of left shoulder   2. Body mass index 50.0-59.9, adult ([place])   3. Arthritis of right knee   4. Unilateral primary osteoarthritis, right knee   5. Spinal stenosis of lumbar region with neurogenic claudication     Plan: Knee is suffering from osteoarthritis, only real proven treatments are Weight loss, NSIADs like diclofenac and exercise. Well padded shoes help. Ice the knee that is suffering from osteoarthritis, only real proven treatments are Weight loss, NSIADs like diclofenac and exercise. Well padded shoes help. Ice the knee 2-3 times a day 15-20 mins at a time.-3 times a day 15-20 mins at a time. Hot showers in the AM.  Injection with steroid may be of benefit. Hemp CBD capsules, amazon.com 5,000-7,000 mg per bottle, 60 capsules per bottle, take one capsule twice a day. Cane in the left hand to use with left leg weight bearing. Follow-Up Instructions: No follow-ups on file.   Follow-Up Instructions: Return in about 4 weeks (around 01/17/2021) for Schedule an appointment to see Dr. Marlou Sa for consideration of eval for eventual  TKR.   Orders:  Orders Placed This Encounter  Procedures  . Large Joint Inj: R knee   Meds ordered this encounter  Medications  . DISCONTD: diclofenac (VOLTAREN) 75 MG EC tablet    Sig: Take 1 tablet (75 mg total) by mouth 2 (two) times daily.    Dispense:  60 tablet    Refill:  2  . diclofenac (VOLTAREN) 75 MG EC tablet    Sig: Take 1 tablet (75 mg total) by mouth 2 (two) times daily.    Dispense:  60 tablet    Refill:  2      Procedures: Large Joint Inj: R knee on 12/20/2020 11:08 AM Indications: pain Details: 25 G 1.5 in  needle, anterolateral approach  Arthrogram: No  Medications: 40 mg methylPREDNISolone acetate 40 MG/ML; 4 mL bupivacaine 0.25 % Outcome: tolerated well, no immediate complications Procedure, treatment alternatives, risks and benefits explained, specific risks discussed. Consent was given by the patient. Immediately prior to procedure a time out was called to verify the correct patient, procedure, equipment, support staff and site/side marked as required. Patient was prepped and draped in the usual sterile fashion.       Clinical Data: No additional findings.   Subjective: Chief Complaint  Patient presents with  . Right Knee - Follow-up    MRI Review    55 year old female with increasing right knee pain over the right medial knee and posteriorly and into the lateral right knee. Has lumbar epidural lipomatosis and elevated BMI 51. She is trying to lose wait.   Review of Systems   Objective: Vital Signs: BP 95/65 (BP Location: Left Arm, Patient Position: Sitting)   Pulse 76   Ht 5\' 4"  (1.626 m)   Wt 298 lb (135.2 kg)   LMP 08/17/2012   BMI 51.15 kg/m   Physical Exam Constitutional:      Appearance: She is well-developed.  HENT:  Head: Normocephalic and atraumatic.  Eyes:     Pupils: Pupils are equal, round, and reactive to light.  Pulmonary:     Effort: Pulmonary effort is normal.     Breath sounds: Normal breath sounds.  Abdominal:     General: Bowel sounds are normal.     Palpations: Abdomen is soft.  Musculoskeletal:     Cervical back: Normal range of motion and neck supple.     Right knee:     Instability Tests: Medial McMurray test positive and lateral McMurray test positive.  Skin:    General: Skin is warm and dry.  Neurological:     Mental Status: She is alert and oriented to person, place, and time.  Psychiatric:        Behavior: Behavior normal.        Thought Content: Thought content normal.        Judgment: Judgment normal.     Right Knee  Exam   Muscle Strength  The patient has normal right knee strength.  Tenderness  The patient is experiencing tenderness in the medial joint line and lateral joint line.  Range of Motion  Extension:  10 abnormal  Flexion:  110 abnormal   Tests  McMurray:  Medial - positive Lateral - positive Lachman:  Anterior - negative    Posterior - negative Drawer:  Anterior - negative    Posterior - negative      Specialty Comments:  No specialty comments available.  Imaging: No results found.   PMFS History: Patient Active Problem List   Diagnosis Date Noted  . Carpal tunnel syndrome, left upper limb 05/11/2019    Priority: High    Class: Chronic  . Spinal stenosis of lumbar region with neurogenic claudication 08/20/2018  . 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 Primary osteoarthritis of right knee 02/27/2017  . Right tennis elbow 11/11/2016  . Muscle cramps 01/15/2016  . Bilateral leg edema 01/08/2016  . Radiculopathy 12/20/2015  . Hyperglycemia 12/21/2014  . Mastodynia, female 11/30/2014  . S/P excision of fibroadenoma of breast 11/30/2014  . Angioedema of lips 04/07/2014  . Fibroadenoma of right breast 03/07/2014  . Pelvic pain in female 06/19/2012  . Menorrhagia 06/11/2012  . SUI (stress urinary incontinence, female) 06/11/2012  . HTN (hypertension) 06/11/2012  . IBS (irritable bowel syndrome) - diarrhea predominant 03/17/2012  . MICROSCOPIC HEMATURIA 11/30/2010  . BREAST PAIN, RIGHT 11/30/2010  . BREAST MASS, RIGHT 01/12/2010  . HYPERCHOLESTEROLEMIA 12/07/2007  . CIGARETTE SMOKER 12/07/2007  . DEGENERATIVE JOINT DISEASE 12/07/2007  . Low back pain with sciatica 12/07/2007  . HEADACHE 12/07/2007  . Obesity 08/03/2007  . Anxiety state 08/03/2007   Past Medical History:  Diagnosis Date  . Anxiety   . Back pain with radiation   . Breast mass, right   . Chronic pain   . Chronic, continuous use of opioids   . Complication of anesthesia 04/07/14   Allergic  reaction to Lisinopril immediately following surgery  . DJD (degenerative joint disease)   . Fibromyalgia   . Groin abscess   . Headache(784.0)   . History of IBS   . Hypercholesterolemia   . IBS (irritable bowel syndrome)   . Lactose intolerance   . Mild hypertension   . Obesity   . Tobacco use disorder   . Umbilical hernia    Symptomatic    Family History  Problem Relation Age of Onset  . Diabetes Father   . Diabetes Mother   . Prostate cancer  Paternal Grandfather   . Breast cancer Maternal Aunt 46    Past Surgical History:  Procedure Laterality Date  . ABDOMINAL HYSTERECTOMY    . ANTERIOR CERVICAL DECOMP/DISCECTOMY FUSION N/A 12/20/2015   Procedure: ANTERIOR CERVICAL DECOMPRESSION FUSION CERVICAL 4-5, CERVICAL 5-6, CERVICAL 6-7 WITH INSTRUMENTATION AND ALLOGRAFT;  Surgeon: Phylliss Bob, MD;  Location: [facility];  Service: Orthopedics;  Laterality: N/A;  Anterior cervical decompression fusion, cervical 4-5, cervical 5-6, cervical 6-7 with instrumentation and allograft  . BACK SURGERY    . BILATERAL SALPINGECTOMY  09/03/2012   Procedure: BILATERAL SALPINGECTOMY;  Surgeon: [name], MD;  Location: [facility];  Service: Gynecology;  Laterality: Bilateral;  . BREAST BIOPSY Right 04/07/2014   Procedure: REMOVAL RIGHT BREAST MASS WITH WIRE LOCALIZATION;  Surgeon: [name], MD;  Location: [facility];  Service: General;  Laterality: Right;  . BREAST EXCISIONAL BIOPSY Left    x2  . BREAST LUMPECTOMY     x2  . CARPAL TUNNEL RELEASE Left 05/11/2019   Procedure: LEFT CARPAL TUNNEL RELEASE, RIGHT TENNIS ELBOW MARCAINE/DEPO MEDROL INJECTION UNDER ANESTHESIA;  Surgeon: Jessy Oto, MD;  Location: [facility];  Service: Orthopedics;  Laterality: Left;  . COLONOSCOPY W/ BIOPSIES  04/24/2012   per Dr. Carlean Purl, clear, repeat in 10 yrs   . disectomy    . ESOPHAGOGASTRODUODENOSCOPY    . FINGER SURGERY     Right index-excision of mass   . FOOT SURGERY Right    Bone Spurs  . LAPAROSCOPIC  HYSTERECTOMY  09/03/2012   Procedure: HYSTERECTOMY TOTAL LAPAROSCOPIC;  Surgeon: [name], MD;  Location: [facility];  Service: Gynecology;  Laterality: N/A;  . LUMBAR [place]    . TUBAL LIGATION    . UMBILICAL HERNIA REPAIR N/A 05/01/2018   Procedure: UMBILICAL HERNIA REPAIR;  Surgeon: Judeth Horn, MD;  Location: [facility];  Service: General;  Laterality: N/A;   Social History   Occupational History  . Occupation: Optometrist  Tobacco Use  . Smoking status: Former Smoker    Years: 33.00    Types: Cigarettes  . Smokeless tobacco: Never Used  . Tobacco comment: quit March 2017  Vaping Use  . Vaping Use: Never used  Substance and Sexual Activity  . Alcohol use: Not Currently    Alcohol/week: 0.0 standard drinks    Comment: occ  . Drug use: Yes    Comment: chronic use of dilaudid  . Sexual activity: Yes    Birth control/protection: Surgical

## 2021-01-24 ENCOUNTER — Telehealth (INDEPENDENT_AMBULATORY_CARE_PROVIDER_SITE_OTHER): Payer: 59 | Admitting: Family Medicine

## 2021-01-24 ENCOUNTER — Encounter: Payer: Self-pay | Admitting: Family Medicine

## 2021-01-24 DIAGNOSIS — G8929 Other chronic pain: Secondary | ICD-10-CM | POA: Diagnosis not present

## 2021-01-24 DIAGNOSIS — M5440 Lumbago with sciatica, unspecified side: Secondary | ICD-10-CM

## 2021-01-24 DIAGNOSIS — F1190 Opioid use, unspecified, uncomplicated: Secondary | ICD-10-CM | POA: Diagnosis not present

## 2021-01-24 MED ORDER — HYDROMORPHONE HCL 4 MG PO TABS: 4.0000 mg | ORAL_TABLET | Freq: Four times a day (QID) | ORAL | 0 refills | Status: AC | PRN

## 2021-01-24 MED ORDER — HYDROMORPHONE HCL 4 MG PO TABS: 4.0000 mg | ORAL_TABLET | Freq: Four times a day (QID) | ORAL | 0 refills | Status: DC | PRN

## 2021-01-24 NOTE — Progress Notes (Signed)
Subjective:    Patient ID: Dana [name], female    DOB: 1966/09/11, 55 y.o.   MRN: [id number]  HPI Virtual Visit via Video Note  I connected with the patient on 01/24/21 at 10:45 AM EDT by a video enabled telemedicine application and verified that I am speaking with the correct person using two identifiers.  Location patient: home Location provider:work or home office Persons participating in the virtual visit: patient, provider  I discussed the limitations of evaluation and management by telemedicine and the availability of in person appointments. The patient expressed understanding and agreed to proceed.   HPI: Here for pain management, she is doing well.    ROS: See pertinent positives and negatives per HPI.  Past Medical History:  Diagnosis Date  . Anxiety   . Back pain with radiation   . Breast mass, right   . Chronic pain   . Chronic, continuous use of opioids   . Complication of anesthesia 04/07/14   Allergic reaction to Lisinopril immediately following surgery  . DJD (degenerative joint disease)   . Fibromyalgia   . Groin abscess   . Headache(784.0)   . History of IBS   . Hypercholesterolemia   . IBS (irritable bowel syndrome)   . Lactose intolerance   . Mild hypertension   . Obesity   . Tobacco use disorder   . Umbilical hernia    Symptomatic    Past Surgical History:  Procedure Laterality Date  . ABDOMINAL HYSTERECTOMY    . ANTERIOR CERVICAL DECOMP/DISCECTOMY FUSION N/A 12/20/2015   Procedure: ANTERIOR CERVICAL DECOMPRESSION FUSION CERVICAL 4-5, CERVICAL 5-6, CERVICAL 6-7 WITH INSTRUMENTATION AND ALLOGRAFT;  Surgeon: Phylliss Bob, MD;  Location: [facility];  Service: Orthopedics;  Laterality: N/A;  Anterior cervical decompression fusion, cervical 4-5, cervical 5-6, cervical 6-7 with instrumentation and allograft  . BACK SURGERY    . BILATERAL SALPINGECTOMY  09/03/2012   Procedure: BILATERAL SALPINGECTOMY;  Surgeon: [name], MD;  Location: [facility];   Service: Gynecology;  Laterality: Bilateral;  . BREAST BIOPSY Right 04/07/2014   Procedure: REMOVAL RIGHT BREAST MASS WITH WIRE LOCALIZATION;  Surgeon: [name], MD;  Location: [facility];  Service: General;  Laterality: Right;  . BREAST EXCISIONAL BIOPSY Left    x2  . BREAST LUMPECTOMY     x2  . CARPAL TUNNEL RELEASE Left 05/11/2019   Procedure: LEFT CARPAL TUNNEL RELEASE, RIGHT TENNIS ELBOW MARCAINE/DEPO MEDROL INJECTION UNDER ANESTHESIA;  Surgeon: Jessy Oto, MD;  Location: [facility];  Service: Orthopedics;  Laterality: Left;  . COLONOSCOPY W/ BIOPSIES  04/24/2012   per Dr. Carlean Purl, clear, repeat in 10 yrs   . disectomy    . ESOPHAGOGASTRODUODENOSCOPY    . FINGER SURGERY     Right index-excision of mass   . FOOT SURGERY Right    Bone Spurs  . LAPAROSCOPIC HYSTERECTOMY  09/03/2012   Procedure: HYSTERECTOMY TOTAL LAPAROSCOPIC;  Surgeon: [name], MD;  Location: [facility];  Service: Gynecology;  Laterality: N/A;  . LUMBAR [place]    . TUBAL LIGATION    . UMBILICAL HERNIA REPAIR N/A 05/01/2018   Procedure: UMBILICAL HERNIA REPAIR;  Surgeon: Judeth Horn, MD;  Location: [facility];  Service: General;  Laterality: N/A;    Family History  Problem Relation Age of Onset  . Diabetes Father   . Diabetes Mother   . Prostate cancer Paternal Grandfather   . Breast cancer Maternal Aunt 46     Current Outpatient Medications:  .  ALPRAZolam (XANAX) 0.5 MG tablet, Take 1 tablet (0.5 mg total) by mouth 3 (three) times daily as needed for anxiety., Disp: 90 tablet, Rfl: 5 .  cyclobenzaprine (FLEXERIL) 10 MG tablet, TAKE 10 MG BY MOUTH ONCE DAILY AT BEDTIME (Patient taking differently: Take 10 mg by mouth at bedtime. Take 10 mg by mouth once daily at bedtime), Disp: 90 tablet, Rfl: 3 .  diclofenac (VOLTAREN) 75 MG EC tablet, Take 1 tablet (75 mg total) by mouth 2 (two) times daily., Disp: 60 tablet, Rfl: 2 .  furosemide (LASIX) 40 MG tablet, TAKE 2 TABLETS IN THE MORNING AND ONE IN THE  EVENING (Patient taking differently: Take 40 mg by mouth in the morning and at bedtime.), Disp: 270 tablet, Rfl: 1 .  HYDROmorphone (DILAUDID) 4 MG tablet, Take 1 tablet (4 mg total) by mouth every 6 (six) hours as needed for severe pain., Disp: 120 tablet, Rfl: 0 .  metoprolol tartrate (LOPRESSOR) 50 MG tablet, Take 1 tablet (50 mg total) by mouth 2 (two) times daily., Disp: 180 tablet, Rfl: 3 .  Multiple Vitamin (MULTIVITAMIN WITH MINERALS) TABS tablet, Take 1 tablet by mouth daily., Disp: , Rfl:  .  ondansetron (ZOFRAN) 8 MG tablet, Take 1 tablet (8 mg total) by mouth every 8 (eight) hours as needed for nausea or vomiting., Disp: 30 tablet, Rfl: 0 .  potassium chloride (KLOR-CON 10) 10 MEQ tablet, Take 1 tablet (10 mEq total) by mouth 2 (two) times daily. (Patient taking differently: Take 10 mEq by mouth daily.), Disp: 180 tablet, Rfl: 3 .  temazepam (RESTORIL) 30 MG capsule, TAKE 1 CAPSULE (30 MG TOTAL) BY MOUTH AT BEDTIME AS NEEDED FOR SLEEP., Disp: 30 capsule, Rfl: 5 .  traMADol (ULTRAM) 50 MG tablet, Take 50 mg by mouth every 6 (six) hours as needed., Disp: , Rfl:  .  traMADol (ULTRAM-ER) 200 MG 24 hr tablet, Take 1 tablet (200 mg total) by mouth daily., Disp: 30 tablet, Rfl: 1  EXAM:  VITALS per patient if applicable:  GENERAL: alert, oriented, appears well and in no acute distress  HEENT: atraumatic, conjunttiva clear, no obvious abnormalities on inspection of external nose and ears  NECK: normal movements of the head and neck  LUNGS: on inspection no signs of respiratory distress, breathing rate appears normal, no obvious gross SOB, gasping or wheezing  CV: no obvious cyanosis  MS: moves all visible extremities without noticeable abnormality  PSYCH/NEURO: pleasant and cooperative, no obvious depression or anxiety, speech and thought processing grossly intact  ASSESSMENT AND PLAN: Pain management. Indication for chronic opioid: low back pain Medication and dose: Dilaudid 4  mg # pills per month: 120 Last UDS date: 05-15-20 Opioid Treatment Agreement signed (Y/N): 12-19-17 Opioid Treatment Agreement last reviewed with patient:  01-24-21 Ryan reviewed this encounter (include red flags): Yes Meds were refilled.  [name], MD  Discussed the following assessment and plan:  No diagnosis found.     I discussed the assessment and treatment plan with the patient. The patient was provided an opportunity to ask questions and all were answered. The patient agreed with the plan and demonstrated an understanding of the instructions.   The patient was advised to call back or seek an in-person evaluation if the symptoms worsen or if the condition fails to improve as anticipated.     Review of Systems     Objective:   Physical Exam        Assessment & Plan:

## 2021-01-29 ENCOUNTER — Ambulatory Visit: Payer: Self-pay

## 2021-01-29 ENCOUNTER — Other Ambulatory Visit: Payer: Self-pay

## 2021-01-29 ENCOUNTER — Ambulatory Visit (INDEPENDENT_AMBULATORY_CARE_PROVIDER_SITE_OTHER): Payer: 59 | Admitting: Orthopedic Surgery

## 2021-01-29 DIAGNOSIS — M25562 Pain in left knee: Secondary | ICD-10-CM | POA: Diagnosis not present

## 2021-01-29 DIAGNOSIS — M1712 Unilateral primary osteoarthritis, left knee: Secondary | ICD-10-CM | POA: Diagnosis not present

## 2021-01-31 ENCOUNTER — Encounter: Payer: Self-pay | Admitting: Orthopedic Surgery

## 2021-01-31 MED ORDER — LIDOCAINE HCL 1 % IJ SOLN
5.0000 mL | INTRAMUSCULAR | Status: AC | PRN
  Administered 2021-01-29: 5 mL

## 2021-01-31 MED ORDER — BUPIVACAINE HCL 0.25 % IJ SOLN
4.0000 mL | INTRAMUSCULAR | Status: AC | PRN
  Administered 2021-01-29: 4 mL via INTRA_ARTICULAR

## 2021-01-31 MED ORDER — BETAMETHASONE SOD PHOS & ACET 6 (3-3) MG/ML IJ SUSP
6.0000 mg | INTRAMUSCULAR | Status: AC | PRN
Start: 2021-01-29 — End: 2021-01-29
  Administered 2021-01-29: 6 mg via INTRA_ARTICULAR

## 2021-01-31 NOTE — Progress Notes (Signed)
Office Visit Note   [name]           Date of Birth: [birth year]           MRN: [id number] Visit Date: 01/29/2021 Requested by: [name], MD [place],  [place] [address] PCP: [name], MD  Subjective: Chief Complaint  Patient presents with  . Left Shoulder - Follow-up  . Left Leg - Pain    HPI: Dana [name] is a 55 year old patient with left proximal lateral leg pain.  Has acute injury for about the last 3 weeks.  Has not been able to walk for 3 weeks.  She is using a rolling walker and a cane.  Denies any groin pain.  Does not have any new back pain.  Denies any numbness and tingling in the left leg.  No real radicular symptoms but more less focal pain at the lateral aspect of the proximal tibia.  She really only has pain with weightbearing.  She has a known history of shoulder problems.  She is doing about the same with her shoulder as she has known rotator cuff arthropathy.  Left leg is hurting her much more than the left shoulder.  She has more back surgery pending.  She has had back surgery in the past.              ROS: All systems reviewed are negative as they relate to the chief complaint within the history of present illness.  Patient denies  fevers or chills.   Assessment & Plan: Visit Diagnoses:  1. Left knee pain, unspecified chronicity     Plan: Impression is left leg pain which is atypical.  Causing her to not be able to ambulate.  Patient does have increased body mass index so stress reaction stress fracture is a consideration primarily of the proximal fibula and tibial region.  She has end-stage arthritis radiographically but likely had that well before her symptoms started in the left leg.  It is possible this could be radiating pain from her back as well.  Plan at this time is MRI of the left tib-fib region to evaluate for proximal stress fracture.  Follow-up after that study.  Nonweightbearing until then.  Does not look like any  type of exertional compartment syndrome.  We will inject the knee today also for diagnostic and therapeutic purposes.  Follow-Up Instructions: Return for after MRI.   Orders:  Orders Placed This Encounter  Procedures  . XR Knee 1-2 Views Left  . MR TIBIA FIBULA LEFT WO CONTRAST   No orders of the defined types were placed in this encounter.     Procedures: Large Joint Inj: L knee on 01/29/2021 3:00 PM Indications: diagnostic evaluation, joint swelling and pain Details: 18 G 1.5 in needle, superolateral approach  Arthrogram: No  Medications: 5 mL lidocaine 1 %; 4 mL bupivacaine 0.25 %; 6 mg betamethasone acetate-betamethasone sodium phosphate 6 (3-3) MG/ML Outcome: tolerated well, no immediate complications Procedure, treatment alternatives, risks and benefits explained, specific risks discussed. Consent was given by the patient. Immediately prior to procedure a time out was called to verify the correct patient, procedure, equipment, support staff and site/side marked as required. Patient was prepped and draped in the usual sterile fashion.       Clinical Data: No additional findings.  Objective: Vital Signs: LMP 08/17/2012   Physical Exam:   Constitutional: Patient appears well-developed HEENT:  Head: Normocephalic Eyes:EOM are normal Neck: Normal range  of motion Cardiovascular: Normal rate Pulmonary/chest: Effort normal Neurologic: Patient is alert Skin: Skin is warm Psychiatric: Patient has normal mood and affect    Ortho Exam: Ortho exam demonstrates palpable pedal pulses.  No real calf girth circumferential difference with negative Homans bilaterally and no calf tenderness bilaterally.  Most of this patient's pain is around the fibular head and about a handbreadth distal.  No warmth erythema or fluctuance in this area.  Knee does not have an effusion and extensor mechanism is intact.  Collaterals are stable.  No groin pain with internal or external Tatian of the  leg.  No nerve root tension signs.  No paresthesias in that left leg on the dorsal plantar aspect of the foot.  Left shoulder has range of motion of 30/80/140.  Infraspinatus has weakness to external rotation supraspinatus strength fairly reasonable.  Specialty Comments:  No specialty comments available.  Imaging: No results found.   PMFS History: Patient Active Problem List   Diagnosis Date Noted  . Carpal tunnel syndrome, left upper limb 05/11/2019    Class: Chronic  . Spinal stenosis of lumbar region with neurogenic claudication 08/20/2018  . 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 Primary osteoarthritis of right knee 02/27/2017  . Right tennis elbow 11/11/2016  . Muscle cramps 01/15/2016  . Bilateral leg edema 01/08/2016  . Radiculopathy 12/20/2015  . Hyperglycemia 12/21/2014  . Mastodynia, female 11/30/2014  . S/P excision of fibroadenoma of breast 11/30/2014  . Angioedema of lips 04/07/2014  . Fibroadenoma of right breast 03/07/2014  . Pelvic pain in female 06/19/2012  . Menorrhagia 06/11/2012  . SUI (stress urinary incontinence, female) 06/11/2012  . HTN (hypertension) 06/11/2012  . IBS (irritable bowel syndrome) - diarrhea predominant 03/17/2012  . MICROSCOPIC HEMATURIA 11/30/2010  . BREAST PAIN, RIGHT 11/30/2010  . BREAST MASS, RIGHT 01/12/2010  . HYPERCHOLESTEROLEMIA 12/07/2007  . CIGARETTE SMOKER 12/07/2007  . DEGENERATIVE JOINT DISEASE 12/07/2007  . Low back pain with sciatica 12/07/2007  . HEADACHE 12/07/2007  . Obesity 08/03/2007  . Anxiety state 08/03/2007   Past Medical History:  Diagnosis Date  . Anxiety   . Back pain with radiation   . Breast mass, right   . Chronic pain   . Chronic, continuous use of opioids   . Complication of anesthesia 04/07/14   Allergic reaction to Lisinopril immediately following surgery  . DJD (degenerative joint disease)   . Fibromyalgia   . Groin abscess   . Headache(784.0)   . History of IBS   .  Hypercholesterolemia   . IBS (irritable bowel syndrome)   . Lactose intolerance   . Mild hypertension   . Obesity   . Tobacco use disorder   . Umbilical hernia    Symptomatic    Family History  Problem Relation Age of Onset  . Diabetes Father   . Diabetes Mother   . Prostate cancer Paternal Grandfather   . Breast cancer Maternal Aunt 46    Past Surgical History:  Procedure Laterality Date  . ABDOMINAL HYSTERECTOMY    . ANTERIOR CERVICAL DECOMP/DISCECTOMY FUSION N/A 12/20/2015   Procedure: ANTERIOR CERVICAL DECOMPRESSION FUSION CERVICAL 4-5, CERVICAL 5-6, CERVICAL 6-7 WITH INSTRUMENTATION AND ALLOGRAFT;  Surgeon: Phylliss Bob, MD;  Location: [facility];  Service: Orthopedics;  Laterality: N/A;  Anterior cervical decompression fusion, cervical 4-5, cervical 5-6, cervical 6-7 with instrumentation and allograft  . BACK SURGERY    . BILATERAL SALPINGECTOMY  09/03/2012   Procedure: BILATERAL SALPINGECTOMY;  Surgeon: [name], MD;  Location:  [facility];  Service: Gynecology;  Laterality: Bilateral;  . BREAST BIOPSY Right 04/07/2014   Procedure: REMOVAL RIGHT BREAST MASS WITH WIRE LOCALIZATION;  Surgeon: [name], MD;  Location: [facility];  Service: General;  Laterality: Right;  . BREAST EXCISIONAL BIOPSY Left    x2  . BREAST LUMPECTOMY     x2  . CARPAL TUNNEL RELEASE Left 05/11/2019   Procedure: LEFT CARPAL TUNNEL RELEASE, RIGHT TENNIS ELBOW MARCAINE/DEPO MEDROL INJECTION UNDER ANESTHESIA;  Surgeon: Jessy Oto, MD;  Location: [facility];  Service: Orthopedics;  Laterality: Left;  . COLONOSCOPY W/ BIOPSIES  04/24/2012   per Dr. Carlean Purl, clear, repeat in 10 yrs   . disectomy    . ESOPHAGOGASTRODUODENOSCOPY    . FINGER SURGERY     Right index-excision of mass   . FOOT SURGERY Right    Bone Spurs  . LAPAROSCOPIC HYSTERECTOMY  09/03/2012   Procedure: HYSTERECTOMY TOTAL LAPAROSCOPIC;  Surgeon: [name], MD;  Location: [facility];  Service: Gynecology;  Laterality: N/A;  . LUMBAR [place]    . TUBAL LIGATION    . UMBILICAL HERNIA REPAIR N/A 05/01/2018   Procedure: UMBILICAL HERNIA REPAIR;  Surgeon: Judeth Horn, MD;  Location: [facility];  Service: General;  Laterality: N/A;   Social History   Occupational History  . Occupation: Optometrist  Tobacco Use  . Smoking status: Former Smoker    Years: 33.00    Types: Cigarettes  . Smokeless tobacco: Never Used  . Tobacco comment: quit March 2017  Vaping Use  . Vaping Use: Never used  Substance and Sexual Activity  . Alcohol use: Not Currently    Alcohol/week: 0.0 standard drinks    Comment: occ  . Drug use: Yes    Comment: chronic use of dilaudid  . Sexual activity: Yes    Birth control/protection: Surgical

## 2021-02-05 ENCOUNTER — Telehealth: Payer: Self-pay | Admitting: Orthopedic Surgery

## 2021-02-05 NOTE — Telephone Encounter (Signed)
Pt called wanting to verify her MRI referral was sent to [facility]; she would like a CB to confirm please.   [phone]

## 2021-02-05 NOTE — Telephone Encounter (Signed)
Please advise 

## 2021-02-05 NOTE — Telephone Encounter (Signed)
Yes, this went to [facility]

## 2021-02-09 ENCOUNTER — Ambulatory Visit: Payer: 59 | Admitting: Family Medicine

## 2021-02-11 ENCOUNTER — Inpatient Hospital Stay: Admission: RE | Admit: 2021-02-11 | Payer: 59 | Source: Ambulatory Visit

## 2021-02-19 ENCOUNTER — Other Ambulatory Visit: Payer: Self-pay | Admitting: Radiology

## 2021-02-19 MED ORDER — CYCLOBENZAPRINE HCL 10 MG PO TABS: 10.0000 mg | ORAL_TABLET | Freq: Every day | ORAL | 3 refills | Status: DC

## 2021-02-22 ENCOUNTER — Telehealth: Payer: Self-pay | Admitting: Specialist

## 2021-02-22 NOTE — Telephone Encounter (Signed)
I called and advised that Dr. Louanne Skye sent the rx in on Monday to [facility] [address]. She states that [facility] pharmacy is now [facility] [address]

## 2021-02-22 NOTE — Telephone Encounter (Signed)
Pt would like refill on muscle relaxers.   Cb  [phone]

## 2021-02-23 ENCOUNTER — Ambulatory Visit
Admission: RE | Admit: 2021-02-23 | Discharge: 2021-02-23 | Disposition: A | Discharge status: Home / Self Care | Payer: 59 | Source: Ambulatory Visit | Attending: Orthopedic Surgery | Admitting: Orthopedic Surgery

## 2021-02-23 ENCOUNTER — Other Ambulatory Visit: Payer: Self-pay

## 2021-02-23 DIAGNOSIS — M25562 Pain in left knee: Secondary | ICD-10-CM

## 2021-02-28 ENCOUNTER — Ambulatory Visit (INDEPENDENT_AMBULATORY_CARE_PROVIDER_SITE_OTHER): Payer: 59 | Admitting: Orthopedic Surgery

## 2021-02-28 DIAGNOSIS — M1712 Unilateral primary osteoarthritis, left knee: Secondary | ICD-10-CM | POA: Diagnosis not present

## 2021-02-28 DIAGNOSIS — M1711 Unilateral primary osteoarthritis, right knee: Secondary | ICD-10-CM

## 2021-03-03 ENCOUNTER — Encounter: Payer: Self-pay | Admitting: Orthopedic Surgery

## 2021-03-03 MED ORDER — BUPIVACAINE HCL 0.25 % IJ SOLN
4.0000 mL | INTRAMUSCULAR | Status: AC | PRN
  Administered 2021-02-28: 4 mL via INTRA_ARTICULAR

## 2021-03-03 MED ORDER — LIDOCAINE HCL 1 % IJ SOLN
5.0000 mL | INTRAMUSCULAR | Status: AC | PRN
  Administered 2021-02-28: 5 mL

## 2021-03-03 MED ORDER — METHYLPREDNISOLONE ACETATE 40 MG/ML IJ SUSP
40.0000 mg | INTRAMUSCULAR | Status: AC | PRN
  Administered 2021-02-28: 40 mg via INTRA_ARTICULAR

## 2021-03-03 NOTE — Progress Notes (Signed)
Office Visit Note   [name]           Date of Birth: [birth year]           MRN: [id number] Visit Date: 02/28/2021 Requested by: [name], MD [place],  [place] [id number] PCP: [name], MD  Subjective: Chief Complaint  Patient presents with  . Other     Scan review    HPI: Dana [name] is a 55 year old patient with pretty significant proximal lateral left leg pain.  Since she was last seen she has had an MRI of the tib-fib region.  That is reviewed with the patient.  Essentially she has no real pathology in that lateral left leg lateral calf region.  She does have some medial compartment arthritis.  Denies any low back pain or numbness and tingling in the leg.  She does get epidural steroid injections from Dr. Ernestina Patches.  This is likely cause of her symptoms.  She also reports right knee pain with known history of arthritis and would like to get an injection in the right knee today.  Denies any mechanical symptoms in the knee.              ROS: All systems reviewed are negative as they relate to the chief complaint within the history of present illness.  Patient denies  fevers or chills.   Assessment & Plan: Visit Diagnoses:  1. Arthritis of right knee   2. Arthritis of left knee     Plan: Impression is left knee pain and lateral leg pain with no clear-cut anatomic structural damage on MRI scan.  Could be referred pain from the back.  No intervention required at this time.  Continue with brace treatment and activity modification as tolerated.  Regarding the right knee patient is requesting intra-articular cortisone injection which is performed today.  We will see her back as needed.  Nonweightbearing quad strengthening exercises encouraged.  Follow-Up Instructions: Return if symptoms worsen or fail to improve.   Orders:  No orders of the defined types were placed in this encounter.  No orders of the defined types were placed in this  encounter.     Procedures: Large Joint Inj: R knee on 02/28/2021 10:00 AM Indications: diagnostic evaluation, joint swelling and pain Details: 18 G 1.5 in needle, superolateral approach  Arthrogram: No  Medications: 5 mL lidocaine 1 %; 40 mg methylPREDNISolone acetate 40 MG/ML; 4 mL bupivacaine 0.25 % Outcome: tolerated well, no immediate complications Procedure, treatment alternatives, risks and benefits explained, specific risks discussed. Consent was given by the patient. Immediately prior to procedure a time out was called to verify the correct patient, procedure, equipment, support staff and site/side marked as required. Patient was prepped and draped in the usual sterile fashion.      Clinical Data: No additional findings.  Objective: Vital Signs: LMP 08/17/2012   Physical Exam:   Constitutional: Patient appears well-developed HEENT:  Head: Normocephalic Eyes:EOM are normal Neck: Normal range of motion Cardiovascular: Normal rate Pulmonary/chest: Effort normal Neurologic: Patient is alert Skin: Skin is warm Psychiatric: Patient has normal mood and affect   Ortho Exam: Ortho exam demonstrates soft compartments to palpation.  Pedal pulses palpable.  Ankle dorsiflexion plantarflexion intact.  Mild tenderness to palpation over around the left mid fibular region.  No effusion in either knee.  Extensor mechanism intact.  No other masses lymphadenopathy or skin changes noted in the knee region.  Specialty Comments:  No specialty  comments available.  Imaging: No results found.   PMFS History: Patient Active Problem List   Diagnosis Date Noted  . Carpal tunnel syndrome, left upper limb 05/11/2019    Class: Chronic  . Spinal stenosis of lumbar region with neurogenic claudication 08/20/2018  . 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 Primary osteoarthritis of right knee 02/27/2017  . Right tennis elbow 11/11/2016  . Muscle cramps 01/15/2016  . Bilateral leg  edema 01/08/2016  . Radiculopathy 12/20/2015  . Hyperglycemia 12/21/2014  . Mastodynia, female 11/30/2014  . S/P excision of fibroadenoma of breast 11/30/2014  . Angioedema of lips 04/07/2014  . Fibroadenoma of right breast 03/07/2014  . Pelvic pain in female 06/19/2012  . Menorrhagia 06/11/2012  . SUI (stress urinary incontinence, female) 06/11/2012  . HTN (hypertension) 06/11/2012  . IBS (irritable bowel syndrome) - diarrhea predominant 03/17/2012  . MICROSCOPIC HEMATURIA 11/30/2010  . BREAST PAIN, RIGHT 11/30/2010  . BREAST MASS, RIGHT 01/12/2010  . HYPERCHOLESTEROLEMIA 12/07/2007  . CIGARETTE SMOKER 12/07/2007  . DEGENERATIVE JOINT DISEASE 12/07/2007  . Low back pain with sciatica 12/07/2007  . HEADACHE 12/07/2007  . Obesity 08/03/2007  . Anxiety state 08/03/2007   Past Medical History:  Diagnosis Date  . Anxiety   . Back pain with radiation   . Breast mass, right   . Chronic pain   . Chronic, continuous use of opioids   . Complication of anesthesia 04/07/14   Allergic reaction to Lisinopril immediately following surgery  . DJD (degenerative joint disease)   . Fibromyalgia   . Groin abscess   . Headache(784.0)   . History of IBS   . Hypercholesterolemia   . IBS (irritable bowel syndrome)   . Lactose intolerance   . Mild hypertension   . Obesity   . Tobacco use disorder   . Umbilical hernia    Symptomatic    Family History  Problem Relation Age of Onset  . Diabetes Father   . Diabetes Mother   . Prostate cancer Paternal Grandfather   . Breast cancer Maternal Aunt 46    Past Surgical History:  Procedure Laterality Date  . ABDOMINAL HYSTERECTOMY    . ANTERIOR CERVICAL DECOMP/DISCECTOMY FUSION N/A 12/20/2015   Procedure: ANTERIOR CERVICAL DECOMPRESSION FUSION CERVICAL 4-5, CERVICAL 5-6, CERVICAL 6-7 WITH INSTRUMENTATION AND ALLOGRAFT;  Surgeon: Phylliss Bob, MD;  Location: [facility];  Service: Orthopedics;  Laterality: N/A;  Anterior cervical decompression fusion,  cervical 4-5, cervical 5-6, cervical 6-7 with instrumentation and allograft  . BACK SURGERY    . BILATERAL SALPINGECTOMY  09/03/2012   Procedure: BILATERAL SALPINGECTOMY;  Surgeon: [name], MD;  Location: [facility];  Service: Gynecology;  Laterality: Bilateral;  . BREAST BIOPSY Right 04/07/2014   Procedure: REMOVAL RIGHT BREAST MASS WITH WIRE LOCALIZATION;  Surgeon: [name], MD;  Location: [facility];  Service: General;  Laterality: Right;  . BREAST EXCISIONAL BIOPSY Left    x2  . BREAST LUMPECTOMY     x2  . CARPAL TUNNEL RELEASE Left 05/11/2019   Procedure: LEFT CARPAL TUNNEL RELEASE, RIGHT TENNIS ELBOW MARCAINE/DEPO MEDROL INJECTION UNDER ANESTHESIA;  Surgeon: Jessy Oto, MD;  Location: [facility];  Service: Orthopedics;  Laterality: Left;  . COLONOSCOPY W/ BIOPSIES  04/24/2012   per Dr. Carlean Purl, clear, repeat in 10 yrs   . disectomy    . ESOPHAGOGASTRODUODENOSCOPY    . FINGER SURGERY     Right index-excision of mass   . FOOT SURGERY Right    Bone Spurs  .  LAPAROSCOPIC HYSTERECTOMY  09/03/2012   Procedure: HYSTERECTOMY TOTAL LAPAROSCOPIC;  Surgeon: [name], MD;  Location: [facility];  Service: Gynecology;  Laterality: N/A;  . LUMBAR [place]    . TUBAL LIGATION    . UMBILICAL HERNIA REPAIR N/A 05/01/2018   Procedure: UMBILICAL HERNIA REPAIR;  Surgeon: Judeth Horn, MD;  Location: [facility];  Service: General;  Laterality: N/A;   Social History   Occupational History  . Occupation: Optometrist  Tobacco Use  . Smoking status: Former    Years: 33.00    Pack years: 0.00    Types: Cigarettes  . Smokeless tobacco: Never  . Tobacco comments:    quit March 2017  Vaping Use  . Vaping Use: Never used  Substance and Sexual Activity  . Alcohol use: Not Currently    Alcohol/week: 0.0 standard drinks    Comment: occ  . Drug use: Yes    Comment: chronic use of dilaudid  . Sexual activity: Yes    Birth control/protection: Surgical

## 2021-04-26 ENCOUNTER — Ambulatory Visit: Payer: 59 | Admitting: Orthopedic Surgery

## 2021-04-29 ENCOUNTER — Other Ambulatory Visit: Payer: Self-pay | Admitting: Family Medicine

## 2021-05-01 NOTE — Telephone Encounter (Signed)
Pt LOV was on 02/28/2021 Last refill was done on 09/22/2020 for 90 tabs with 5 refills Pt is not available. Please advise

## 2021-05-24 ENCOUNTER — Ambulatory Visit (INDEPENDENT_AMBULATORY_CARE_PROVIDER_SITE_OTHER): Payer: 59 | Admitting: Surgical

## 2021-05-24 ENCOUNTER — Encounter: Payer: Self-pay | Admitting: Family Medicine

## 2021-05-24 ENCOUNTER — Telehealth (INDEPENDENT_AMBULATORY_CARE_PROVIDER_SITE_OTHER): Payer: 59 | Admitting: Family Medicine

## 2021-05-24 ENCOUNTER — Encounter: Payer: Self-pay | Admitting: Surgical

## 2021-05-24 ENCOUNTER — Other Ambulatory Visit: Payer: Self-pay

## 2021-05-24 VITALS — Ht 64.0 in | Wt 298.0 lb

## 2021-05-24 DIAGNOSIS — F1190 Opioid use, unspecified, uncomplicated: Secondary | ICD-10-CM | POA: Diagnosis not present

## 2021-05-24 DIAGNOSIS — M170 Bilateral primary osteoarthritis of knee: Secondary | ICD-10-CM

## 2021-05-24 DIAGNOSIS — M48062 Spinal stenosis, lumbar region with neurogenic claudication: Secondary | ICD-10-CM

## 2021-05-24 DIAGNOSIS — M1711 Unilateral primary osteoarthritis, right knee: Secondary | ICD-10-CM | POA: Diagnosis not present

## 2021-05-24 DIAGNOSIS — M1712 Unilateral primary osteoarthritis, left knee: Secondary | ICD-10-CM | POA: Diagnosis not present

## 2021-05-24 MED ORDER — METHYLPREDNISOLONE ACETATE 40 MG/ML IJ SUSP
40.0000 mg | INTRAMUSCULAR | Status: AC | PRN
  Administered 2021-05-24: 40 mg via INTRA_ARTICULAR

## 2021-05-24 MED ORDER — HYDROMORPHONE HCL 4 MG PO TABS
4.0000 mg | ORAL_TABLET | Freq: Four times a day (QID) | ORAL | 0 refills | Status: AC | PRN
Start: 2021-07-24 — End: 2021-08-23

## 2021-05-24 MED ORDER — LIDOCAINE HCL 1 % IJ SOLN
5.0000 mL | INTRAMUSCULAR | Status: AC | PRN
  Administered 2021-05-24: 5 mL

## 2021-05-24 MED ORDER — HYDROMORPHONE HCL 4 MG PO TABS: 4.0000 mg | ORAL_TABLET | Freq: Four times a day (QID) | ORAL | 0 refills | Status: DC | PRN

## 2021-05-24 MED ORDER — BUPIVACAINE HCL 0.25 % IJ SOLN
4.0000 mL | INTRAMUSCULAR | Status: AC | PRN
  Administered 2021-05-24: 4 mL via INTRA_ARTICULAR

## 2021-05-24 MED ORDER — BUPIVACAINE HCL 0.25 % IJ SOLN
4.0000 mL | INTRAMUSCULAR | Status: AC | PRN
Start: 2021-05-24 — End: 2021-05-24
  Administered 2021-05-24: 4 mL via INTRA_ARTICULAR

## 2021-05-24 MED ORDER — ALPRAZOLAM 0.5 MG PO TABS: 0.5000 mg | ORAL_TABLET | Freq: Three times a day (TID) | ORAL | 5 refills | Status: DC | PRN

## 2021-05-24 NOTE — Progress Notes (Signed)
Subjective:    Patient ID: Dana [name], female    DOB: 10-Nov-1965, 55 y.o.   MRN: [id number]  HPI Virtual Visit via Video Note  I connected with the patient on 05/24/21 at 11:00 AM EDT by a video enabled telemedicine application and verified that I am speaking with the correct person using two identifiers.  Location patient: home Location provider:work or home office Persons participating in the virtual visit: patient, provider  I discussed the limitations of evaluation and management by telemedicine and the availability of in person appointments. The patient expressed understanding and agreed to proceed.   HPI: Here for pain management, she is doing well.    ROS: See pertinent positives and negatives per HPI.  Past Medical History:  Diagnosis Date   Anxiety    Back pain with radiation    Breast mass, right    Chronic pain    Chronic, continuous use of opioids    Complication of anesthesia 04/07/14   Allergic reaction to Lisinopril immediately following surgery   DJD (degenerative joint disease)    Fibromyalgia    Groin abscess    Headache(784.0)    History of IBS    Hypercholesterolemia    IBS (irritable bowel syndrome)    Lactose intolerance    Mild hypertension    Obesity    Tobacco use disorder    Umbilical hernia    Symptomatic    Past Surgical History:  Procedure Laterality Date   ABDOMINAL HYSTERECTOMY     ANTERIOR CERVICAL DECOMP/DISCECTOMY FUSION N/A 12/20/2015   Procedure: ANTERIOR CERVICAL DECOMPRESSION FUSION CERVICAL 4-5, CERVICAL 5-6, CERVICAL 6-7 WITH INSTRUMENTATION AND ALLOGRAFT;  Surgeon: Phylliss Bob, MD;  Location: [facility];  Service: Orthopedics;  Laterality: N/A;  Anterior cervical decompression fusion, cervical 4-5, cervical 5-6, cervical 6-7 with instrumentation and allograft   BACK SURGERY     BILATERAL SALPINGECTOMY  09/03/2012   Procedure: BILATERAL SALPINGECTOMY;  Surgeon: [name], MD;  Location: [facility];  Service: Gynecology;   Laterality: Bilateral;   BREAST BIOPSY Right 04/07/2014   Procedure: REMOVAL RIGHT BREAST MASS WITH WIRE LOCALIZATION;  Surgeon: [name], MD;  Location: [facility];  Service: General;  Laterality: Right;   BREAST EXCISIONAL BIOPSY Left    x2   BREAST LUMPECTOMY     x2   CARPAL TUNNEL RELEASE Left 05/11/2019   Procedure: LEFT CARPAL TUNNEL RELEASE, RIGHT TENNIS ELBOW MARCAINE/DEPO MEDROL INJECTION UNDER ANESTHESIA;  Surgeon: Jessy Oto, MD;  Location: [facility];  Service: Orthopedics;  Laterality: Left;   COLONOSCOPY W/ BIOPSIES  04/24/2012   per Dr. Carlean Purl, clear, repeat in 10 yrs    disectomy     ESOPHAGOGASTRODUODENOSCOPY     FINGER SURGERY     Right index-excision of mass    FOOT SURGERY Right    Bone Spurs   LAPAROSCOPIC HYSTERECTOMY  09/03/2012   Procedure: HYSTERECTOMY TOTAL LAPAROSCOPIC;  Surgeon: [name], MD;  Location: [facility];  Service: Gynecology;  Laterality: N/A;   LUMBAR DISC SURGERY     TUBAL LIGATION     UMBILICAL HERNIA REPAIR N/A 05/01/2018   Procedure: UMBILICAL HERNIA REPAIR;  Surgeon: Judeth Horn, MD;  Location: [employer] [facility];  Service: General;  Laterality: N/A;    Family History  Problem Relation Age of Onset   Diabetes Father    Diabetes Mother    Prostate cancer Paternal Grandfather    Breast cancer Maternal Aunt 46     Current Outpatient Medications:  cyclobenzaprine (FLEXERIL) 10 MG tablet, Take 1 tablet (10 mg total) by mouth at bedtime. Take 10 mg by mouth once daily at bedtime, Disp: 30 tablet, Rfl: 3   diclofenac (VOLTAREN) 75 MG EC tablet, Take 1 tablet (75 mg total) by mouth 2 (two) times daily., Disp: 60 tablet, Rfl: 2   furosemide (LASIX) 40 MG tablet, TAKE 2 TABLETS IN THE MORNING AND ONE IN THE EVENING (Patient taking differently: Take 40 mg by mouth in the morning and at bedtime.), Disp: 270 tablet, Rfl: 1   metoprolol tartrate (LOPRESSOR) 50 MG tablet, Take 1 tablet (50 mg total) by mouth 2 (two) times daily., Disp: 180 tablet, Rfl:  3   Multiple Vitamin (MULTIVITAMIN WITH MINERALS) TABS tablet, Take 1 tablet by mouth daily., Disp: , Rfl:    ondansetron (ZOFRAN) 8 MG tablet, Take 1 tablet (8 mg total) by mouth every 8 (eight) hours as needed for nausea or vomiting., Disp: 30 tablet, Rfl: 0   potassium chloride (KLOR-CON 10) 10 MEQ tablet, Take 1 tablet (10 mEq total) by mouth 2 (two) times daily. (Patient taking differently: Take 10 mEq by mouth daily.), Disp: 180 tablet, Rfl: 3   temazepam (RESTORIL) 30 MG capsule, TAKE 1 CAPSULE (30 MG TOTAL) BY MOUTH AT BEDTIME AS NEEDED FOR SLEEP., Disp: 30 capsule, Rfl: 5   traMADol (ULTRAM) 50 MG tablet, Take 50 mg by mouth every 6 (six) hours as needed., Disp: , Rfl:    traMADol (ULTRAM-ER) 200 MG 24 hr tablet, Take 1 tablet (200 mg total) by mouth daily., Disp: 30 tablet, Rfl: 1   ALPRAZolam (XANAX) 0.5 MG tablet, Take 1 tablet (0.5 mg total) by mouth 3 (three) times daily as needed for anxiety., Disp: 90 tablet, Rfl: 5   [START ON 07/24/2021] HYDROmorphone (DILAUDID) 4 MG tablet, Take 1 tablet (4 mg total) by mouth every 6 (six) hours as needed for severe pain., Disp: 120 tablet, Rfl: 0  EXAM:  VITALS per patient if applicable:  GENERAL: alert, oriented, appears well and in no acute distress  HEENT: atraumatic, conjunttiva clear, no obvious abnormalities on inspection of external nose and ears  NECK: normal movements of the head and neck  LUNGS: on inspection no signs of respiratory distress, breathing rate appears normal, no obvious gross SOB, gasping or wheezing  CV: no obvious cyanosis  MS: moves all visible extremities without noticeable abnormality  PSYCH/NEURO: pleasant and cooperative, no obvious depression or anxiety, speech and thought processing grossly intact  ASSESSMENT AND PLAN: Pain managenment. Indication for chronic opioid: low back pain Medication and dose: Dilaudid 4 mg # pills per month: 120 Last UDS date: 05-15-20 Opioid Treatment Agreement signed  (Y/N): 12-19-17 Opioid Treatment Agreement last reviewed with patient:  05-24-21 [facility] reviewed this encounter (include red flags): Yes Meds were refilled.  [name], MD  Discussed the following assessment and plan:  Chronic narcotic use - Plan: DRUG MONITOR, PANEL 1, W/CONF, URINE     I discussed the assessment and treatment plan with the patient. The patient was provided an opportunity to ask questions and all were answered. The patient agreed with the plan and demonstrated an understanding of the instructions.   The patient was advised to call back or seek an in-person evaluation if the symptoms worsen or if the condition fails to improve as anticipated.      Review of Systems     Objective:   Physical Exam        Assessment & Plan:

## 2021-05-24 NOTE — Progress Notes (Signed)
Office Visit Note   [name]           Date of Birth: 1966-05-09           MRN: [id number] Visit Date: 05/24/2021 Requested by: [name], MD [place],  [place] [id number] PCP: [name], MD  Subjective: Chief Complaint  Patient presents with  . Right Knee - Pain  . Left Knee - Pain    HPI: Dana [name] is a 55 y.o. female who presents to the office complaining of bilateral knee pain.  Patient has a history of severe tricompartmental knee arthritis, worse in the medial compartment.  She complains of bilateral knee pain that she localizes to the medial aspect of the knees.  Denies any groin pain.  No new injuries.  She has no history of diabetes.  Right knee bothers her more than her left knee.  Last right knee injection was on 02/28/2021.  She is under a lot of stress and has a lot to arrange as her mom is just passed away and she has to be involved in the funeral process this coming weekend.  She does not want to be dealing with her knee pain at the same time.  She request injections today..                ROS: All systems reviewed are negative as they relate to the chief complaint within the history of present illness.  Patient denies fevers or chills.  Assessment & Plan: Visit Diagnoses:  1. Primary osteoarthritis of both knees     Plan: Patient is a 55 year old female who presents complaint of bilateral knee pain.  Has history of bilateral knee severe tricompartmental arthritis.  Presents today for knee injections to ease the pain in her knees that will likely flareup as she has to run around a lot this weekend while arranging the funeral for her mother who recently passed away..  Knee injection successfully administered and patient tolerated the procedure well.  Plan to follow-up as needed.  Follow-Up Instructions: No follow-ups on file.   Orders:  No orders of the defined types were placed in this encounter.  No orders of the  defined types were placed in this encounter.     Procedures: Large Joint Inj: bilateral knee on 05/24/2021 3:23 PM Indications: diagnostic evaluation, joint swelling and pain Details: 18 G 1.5 in needle, superolateral approach  Arthrogram: No  Medications (Right): 5 mL lidocaine 1 %; 4 mL bupivacaine 0.25 %; 40 mg methylPREDNISolone acetate 40 MG/ML Medications (Left): 5 mL lidocaine 1 %; 4 mL bupivacaine 0.25 %; 40 mg methylPREDNISolone acetate 40 MG/ML Outcome: tolerated well, no immediate complications Procedure, treatment alternatives, risks and benefits explained, specific risks discussed. Consent was given by the patient. Immediately prior to procedure a time out was called to verify the correct patient, procedure, equipment, support staff and site/side marked as required. Patient was prepped and draped in the usual sterile fashion.      Clinical Data: No additional findings.  Objective: Vital Signs: LMP 08/17/2012   Physical Exam:  Constitutional: Patient appears well-developed HEENT:  Head: Normocephalic Eyes:EOM are normal Neck: Normal range of motion Cardiovascular: Normal rate Pulmonary/chest: Effort normal Neurologic: Patient is alert Skin: Skin is warm Psychiatric: Patient has normal mood and affect  Ortho Exam: Ortho exam demonstrates bilateral knees with 0 degrees extension and 90 degrees of knee flexion.  No calf tenderness.  Negative Homans' sign.  Able  to form straight leg raise.  Tenderness over the medial joint line.  Mild tenderness over the lateral joint line as well.  No pain with hip range of motion bilaterally.  Specialty Comments:  No specialty comments available.  Imaging: No results found.   PMFS History: Patient Active Problem List   Diagnosis Date Noted  . Carpal tunnel syndrome, left upper limb 05/11/2019    Class: Chronic  . Spinal stenosis of lumbar region with neurogenic claudication 08/20/2018  . Congenital deformity of finger  09/12/2017  . Primary osteoarthritis of right knee 02/27/2017  . Right tennis elbow 11/11/2016  . Muscle cramps 01/15/2016  . Bilateral leg edema 01/08/2016  . Radiculopathy 12/20/2015  . Hyperglycemia 12/21/2014  . Mastodynia, female 11/30/2014  . S/P excision of fibroadenoma of breast 11/30/2014  . Angioedema of lips 04/07/2014  . Fibroadenoma of right breast 03/07/2014  . Pelvic pain in female 06/19/2012  . Menorrhagia 06/11/2012  . SUI (stress urinary incontinence, female) 06/11/2012  . HTN (hypertension) 06/11/2012  . IBS (irritable bowel syndrome) - diarrhea predominant 03/17/2012  . MICROSCOPIC HEMATURIA 11/30/2010  . BREAST PAIN, RIGHT 11/30/2010  . BREAST MASS, RIGHT 01/12/2010  . HYPERCHOLESTEROLEMIA 12/07/2007  . CIGARETTE SMOKER 12/07/2007  . DEGENERATIVE JOINT DISEASE 12/07/2007  . Low back pain with sciatica 12/07/2007  . HEADACHE 12/07/2007  . Obesity 08/03/2007  . Anxiety state 08/03/2007   Past Medical History:  Diagnosis Date  . Anxiety   . Back pain with radiation   . Breast mass, right   . Chronic pain   . Chronic, continuous use of opioids   . Complication of anesthesia 04/07/14   Allergic reaction to Lisinopril immediately following surgery  . DJD (degenerative joint disease)   . Fibromyalgia   . Groin abscess   . Headache(784.0)   . History of IBS   . Hypercholesterolemia   . IBS (irritable bowel syndrome)   . Lactose intolerance   . Mild hypertension   . Obesity   . Tobacco use disorder   . Umbilical hernia    Symptomatic    Family History  Problem Relation Age of Onset  . Diabetes Father   . Diabetes Mother   . Prostate cancer Paternal Grandfather   . Breast cancer Maternal Aunt 46    Past Surgical History:  Procedure Laterality Date  . ABDOMINAL HYSTERECTOMY    . ANTERIOR CERVICAL DECOMP/DISCECTOMY FUSION N/A 12/20/2015   Procedure: ANTERIOR CERVICAL DECOMPRESSION FUSION CERVICAL 4-5, CERVICAL 5-6, CERVICAL 6-7 WITH INSTRUMENTATION  AND ALLOGRAFT;  Surgeon: Phylliss Bob, MD;  Location: [facility];  Service: Orthopedics;  Laterality: N/A;  Anterior cervical decompression fusion, cervical 4-5, cervical 5-6, cervical 6-7 with instrumentation and allograft  . BACK SURGERY    . BILATERAL SALPINGECTOMY  09/03/2012   Procedure: BILATERAL SALPINGECTOMY;  Surgeon: [name], MD;  Location: [facility];  Service: Gynecology;  Laterality: Bilateral;  . BREAST BIOPSY Right 04/07/2014   Procedure: REMOVAL RIGHT BREAST MASS WITH WIRE LOCALIZATION;  Surgeon: [name], MD;  Location: [facility];  Service: General;  Laterality: Right;  . BREAST EXCISIONAL BIOPSY Left    x2  . BREAST LUMPECTOMY     x2  . CARPAL TUNNEL RELEASE Left 05/11/2019   Procedure: LEFT CARPAL TUNNEL RELEASE, RIGHT TENNIS ELBOW MARCAINE/DEPO MEDROL INJECTION UNDER ANESTHESIA;  Surgeon: Jessy Oto, MD;  Location: [facility];  Service: Orthopedics;  Laterality: Left;  . COLONOSCOPY W/ BIOPSIES  04/24/2012   per Dr. Carlean Purl, clear, repeat in 10  yrs   . disectomy    . ESOPHAGOGASTRODUODENOSCOPY    . FINGER SURGERY     Right index-excision of mass   . FOOT SURGERY Right    Bone Spurs  . LAPAROSCOPIC HYSTERECTOMY  09/03/2012   Procedure: HYSTERECTOMY TOTAL LAPAROSCOPIC;  Surgeon: [name], MD;  Location: [facility];  Service: Gynecology;  Laterality: N/A;  . LUMBAR [place]    . TUBAL LIGATION    . UMBILICAL HERNIA REPAIR N/A 05/01/2018   Procedure: UMBILICAL HERNIA REPAIR;  Surgeon: Judeth Horn, MD;  Location: [facility];  Service: General;  Laterality: N/A;   Social History   Occupational History  . Occupation: Optometrist  Tobacco Use  . Smoking status: Former    Years: 33.00    Types: Cigarettes  . Smokeless tobacco: Never  . Tobacco comments:    quit March 2017  Vaping Use  . Vaping Use: Never used  Substance and Sexual Activity  . Alcohol use: Not Currently    Alcohol/week: 0.0 standard drinks    Comment: occ  . Drug use: Yes    Comment:  chronic use of dilaudid  . Sexual activity: Yes    Birth control/protection: Surgical

## 2021-05-29 ENCOUNTER — Telehealth: Payer: Self-pay | Admitting: Physical Medicine and Rehabilitation

## 2021-05-29 DIAGNOSIS — M5416 Radiculopathy, lumbar region: Secondary | ICD-10-CM

## 2021-05-29 NOTE — Telephone Encounter (Signed)
Left L1 TF on 08/28/20. Ok to repeat if helped, same problem/side, and no new injury?

## 2021-05-29 NOTE — Telephone Encounter (Signed)
Pt called asking for a CB around 1:30 to set her appt.

## 2021-05-29 NOTE — Telephone Encounter (Signed)
Left message #2

## 2021-05-29 NOTE — Telephone Encounter (Signed)
Pt called stating she would like to get an appt for an epidural and would like a CB.   [phone]

## 2021-05-29 NOTE — Telephone Encounter (Signed)
Left message #1

## 2021-05-30 NOTE — Telephone Encounter (Signed)
Pt called back this morning

## 2021-05-30 NOTE — Telephone Encounter (Signed)
Referral placed.

## 2021-06-07 ENCOUNTER — Telehealth: Payer: Self-pay | Admitting: Physical Medicine and Rehabilitation

## 2021-06-07 NOTE — Telephone Encounter (Signed)
Patient called needing to schedule an appointment for her back with Dr. Ernestina Patches. The number to contact patient is [phone]

## 2021-06-07 NOTE — Telephone Encounter (Signed)
Called patient back. She already has an appointment.

## 2021-06-12 ENCOUNTER — Ambulatory Visit (INDEPENDENT_AMBULATORY_CARE_PROVIDER_SITE_OTHER): Payer: 59 | Admitting: Physical Medicine and Rehabilitation

## 2021-06-12 ENCOUNTER — Other Ambulatory Visit: Payer: Self-pay

## 2021-06-12 ENCOUNTER — Ambulatory Visit: Payer: Self-pay

## 2021-06-12 ENCOUNTER — Encounter: Payer: Self-pay | Admitting: Physical Medicine and Rehabilitation

## 2021-06-12 VITALS — BP 158/68 | HR 90

## 2021-06-12 DIAGNOSIS — M5441 Lumbago with sciatica, right side: Secondary | ICD-10-CM

## 2021-06-12 DIAGNOSIS — G8929 Other chronic pain: Secondary | ICD-10-CM

## 2021-06-12 DIAGNOSIS — G894 Chronic pain syndrome: Secondary | ICD-10-CM

## 2021-06-12 DIAGNOSIS — M48062 Spinal stenosis, lumbar region with neurogenic claudication: Secondary | ICD-10-CM

## 2021-06-12 DIAGNOSIS — M5442 Lumbago with sciatica, left side: Secondary | ICD-10-CM | POA: Diagnosis not present

## 2021-06-12 DIAGNOSIS — M5416 Radiculopathy, lumbar region: Secondary | ICD-10-CM | POA: Diagnosis not present

## 2021-06-12 DIAGNOSIS — M47816 Spondylosis without myelopathy or radiculopathy, lumbar region: Secondary | ICD-10-CM

## 2021-06-12 MED ORDER — DEXAMETHASONE SODIUM PHOSPHATE 10 MG/ML IJ SOLN
15.0000 mg | Freq: Once | INTRAMUSCULAR | Status: AC
  Administered 2021-06-12: 15 mg

## 2021-06-12 NOTE — Patient Instructions (Signed)

## 2021-06-12 NOTE — Progress Notes (Signed)
Pt state lower back pain. Pt state walking, standing and bending makes the pain worse. Pt state she uses ice to help ease her pain.  Numeric Pain Rating Scale and Functional Assessment Average Pain 7   In the last MONTH (on 0-10 scale) has pain interfered with the following?  1. General activity like being  able to carry out your everyday physical activities such as walking, climbing stairs, carrying groceries, or moving a chair?  Rating(10)   +Driver, -BT, -Dye Allergies.

## 2021-06-17 ENCOUNTER — Encounter: Payer: Self-pay | Admitting: Physical Medicine and Rehabilitation

## 2021-06-17 NOTE — Progress Notes (Signed)
Dana [name] - 55 y.o. female MRN [id number]  Date of birth: [birth year]  Office Visit Note: Visit Date: 06/12/2021 PCP: [name], MD Referred by: [name], MD  Subjective: Chief Complaint  Patient presents with   Lower Back - Pain   HPI:  Dana [name] is a 55 y.o. female who comes in today For evaluation and management of chronic worsening severe low back pain and bilateral hip and leg pain consistent with lumbar stenosis and facet arthropathy.  Patient is fairly well-known to me through Dr. Basil Dess.  We have completed epidural injections and other injections in the past for her spine pain.  She has not seen Dr. Louanne Skye recently but we did complete an L1 transforaminal injection in December.  She had called in recently to have that injection repeated.  My staff contacted her and she did report good relief of that injection for the type of pain that she was having and she endorsed on the phone that it was a very similar type of pain in the low back and legs.  Nonetheless however she comes in today stating is this injection that might be performed a similar epidural injection.  She is on chronic narcotic medication with chronic opioid treatment.  She takes up to 4 tablets of 4 mg hydromorphone monthly along with Xanax for anxiety and that combination can be quite problematic.  This is followed by her primary care physician Dr. [name].  Despite these medications she still in severe pain.  She has been seeing Dr. Anderson Malta for her shoulder.  She rates her current pain as a 7 out of 10 but a 10 out of 10 is in terms of functional disability and being able to carry out daily activity.  She has not noted focal weakness but is felt weak.  She had no trauma or new injury.  MRIs from 2019 and there was severe multifactorial stenosis at L4-5 but multilevel stenosis Duda lipomatosis.  Patient is about higher BMI.  Review of Systems  Musculoskeletal:  Positive for back pain and  joint pain.  All other systems reviewed and are negative. Otherwise per HPI.  Assessment & Plan: Visit Diagnoses:    ICD-10-CM   1. Lumbar radiculopathy  M54.16 XR C-ARM NO REPORT    Epidural Steroid injection    dexamethasone (DECADRON) injection 15 mg    2. Spinal stenosis of lumbar region with neurogenic claudication  M48.062     3. Chronic pain syndrome  G89.4     4. Chronic bilateral low back pain with bilateral sciatica  M54.42    M54.41    G89.29     5. Spondylosis without myelopathy or radiculopathy, lumbar region  M47.816       Plan: Findings:  Chronic severe and recalcitrant low back and bilateral hip and leg pain consistent with neurogenic claudication from lumbar stenosis do to facet arthropathy and epidural lipomatosis.  Patient continues to have pain despite significant chronic opioid treatment.  She has gotten relief in the past with intermittent epidural injection.  The last injection in December did give her relief and so organ to repeat the L1 transforaminal injection today.  We will consider an L4 injection in the future.  She probably should follow-up with Dr. Basil Dess to consider decompression surgery of this level I think this would help her quite a bit.  She needs to continue to look at weight loss as well.  Typically the epidural lipomatosis  will diminish with weight loss even though that is not an easy solution.   Meds & Orders:  Meds ordered this encounter  Medications   dexamethasone (DECADRON) injection 15 mg    Orders Placed This Encounter  Procedures   XR C-ARM NO REPORT   Epidural Steroid injection    Follow-up: Return if symptoms worsen or fail to improve.   Procedures: No procedures performed  Lumbosacral Transforaminal Epidural Steroid Injection - Sub-Pedicular Approach with Fluoroscopic Guidance  [name]      Date of Birth: [birth year] MRN: [id number] PCP: [name], MD      Visit Date: 06/12/2021   Universal  Protocol:    Date/Time: 06/12/2021  Consent Given By: the patient  Position: PRONE  Additional Comments: Vital signs were monitored before and after the procedure. Patient was prepped and draped in the usual sterile fashion. The correct patient, procedure, and site was verified.   Injection Procedure Details:   Procedure diagnoses: Lumbar radiculopathy [M54.16]    Meds Administered:  Meds ordered this encounter  Medications   dexamethasone (DECADRON) injection 15 mg    Laterality: Left  Location/Site: L1  Needle:5.0 in., 22 ga.  Short bevel or Quincke spinal needle  Needle Placement: Transforaminal  Findings:    -Comments: Excellent flow of contrast along the nerve, nerve root and into the epidural space.  Procedure Details: After squaring off the end-plates to get a true AP view, the C-arm was positioned so that an oblique view of the foramen as noted above was visualized. The target area is just inferior to the "nose of the scotty dog" or sub pedicular. The soft tissues overlying this structure were infiltrated with 2-3 ml. of 1% Lidocaine without Epinephrine.  The spinal needle was inserted toward the target using a "trajectory" view along the fluoroscope beam.  Under AP and lateral visualization, the needle was advanced so it did not puncture dura and was located close the 6 O'Clock position of the pedical in AP tracterory. Biplanar projections were used to confirm position. Aspiration was confirmed to be negative for CSF and/or blood. A 1-2 ml. volume of Isovue-250 was injected and flow of contrast was noted at each level. Radiographs were obtained for documentation purposes.   After attaining the desired flow of contrast documented above, a 0.5 to 1.0 ml test dose of 0.25% Marcaine was injected into each respective transforaminal space.  The patient was observed for 90 seconds post injection.  After no sensory deficits were reported, and normal lower extremity motor  function was noted,   the above injectate was administered so that equal amounts of the injectate were placed at each foramen (level) into the transforaminal epidural space.   Additional Comments:  No complications occurred Dressing: 2 x 2 sterile gauze and Band-Aid    Post-procedure details: Patient was observed during the procedure. Post-procedure instructions were reviewed.  Patient left the clinic in stable condition.    Clinical History: MRI LUMBAR SPINE WITHOUT CONTRAST   TECHNIQUE: Multiplanar, multisequence MR imaging of the lumbar spine was performed. No intravenous contrast was administered.   COMPARISON:  CT Abdomen and Pelvis 02/26/2018. Lumbar MRI 06/13/2017.   FINDINGS: Segmentation: Normal as seen on the comparison CT, which is the same numbering system used on the 2018 MRI.   Alignment: Stable vertebral height and alignment with mild straightening of lumbar lordosis. No spondylolisthesis.   Vertebrae: Chronic degenerative endplate marrow signal changes at L2-L3, sclerotic on the recent CT. Background bone marrow  signal is within normal limits. No marrow edema or evidence of acute osseous abnormality. Intact visible sacrum and SI joints.   Conus medullaris and cauda equina: Conus extends to the L1 level. No lower spinal cord or conus signal abnormality.   Paraspinal and other soft tissues: Large body habitus. Stable visible abdominal viscera. Negative visualized posterior paraspinal soft tissues.   Disc levels:   T10-T11: Disc space loss with circumferential disc bulge. Mild posterior element hypertrophy. Mild spinal stenosis and left T10 foraminal stenosis. Possible mild associated spinal cord mass effect, but no definite cord signal abnormality.   T11-T12: Broad-based posterior disc bulge or protrusion. Borderline to mild spinal stenosis.   T12-L1: Progressed broad-based posterior disc bulging since 2018. Mild facet hypertrophy and epidural  lipomatosis. New mild spinal stenosis. No mass effect on the conus.   L1-L2: Progressed epidural lipomatosis. Chronic circumferential disc bulging which may also be mildly increased. Stable mild facet hypertrophy. Increased spinal stenosis, now mild to moderate. Mild left L1 foraminal stenosis is stable.   L2-L3: Chronic severe disc space loss. Chronic circumferential disc osteophyte complex. Interval stable epidural lipomatosis and mild to moderate facet hypertrophy. Stable mild to moderate spinal stenosis. Stable mild right L2 foraminal stenosis.   L3-L4: Chronic circumferential but mostly far lateral disc bulging and moderate facet and ligament flavum hypertrophy. Stable epidural lipomatosis and mild to moderate spinal stenosis. Stable mild right L3 foraminal stenosis.   L4-L5: Chronic broad-based posterior disc bulge or protrusion and moderate to severe facet hypertrophy is stable. Chronic degenerative facet joint fluid. Chronic epidural lipomatosis. Stable severe spinal stenosis (series 5, image 23). No significant foraminal stenosis.   L5-S1: Chronic broad-based posterior disc bulge or protrusion appears stable along with moderate bilateral facet hypertrophy. Stable epidural lipomatosis and mild spinal stenosis.   IMPRESSION: 1. Progressed spinal stenosis since 2018 at T12-L1 and L1-L2, mild to moderate, and related to increased disc bulging and epidural lipomatosis. 2. Stable severe multifactorial spinal stenosis at L4-L5, and moderate spinal stenosis at L2-L3 and L3-L4. 3. Mild lower thoracic spinal stenosis with up to mild spinal cord mass effect at T10-T11, but no lower spinal cord or conus signal abnormality. 4.  No acute osseous abnormality.     Electronically Signed   By: Genevie Ann M.D.   On: 04/16/2018 15:43     Objective:  VS:  HT:    WT:   BMI:     BP:(!) 158/68  HR:90bpm  TEMP: ( )  RESP:  Physical Exam Vitals and nursing note reviewed.   Constitutional:      General: She is not in acute distress.    Appearance: Normal appearance. She is obese. She is not ill-appearing.  HENT:     Head: Normocephalic and atraumatic.     Right Ear: External ear normal.     Left Ear: External ear normal.  Eyes:     Extraocular Movements: Extraocular movements intact.  Cardiovascular:     Rate and Rhythm: Normal rate.     Pulses: Normal pulses.  Pulmonary:     Effort: Pulmonary effort is normal. No respiratory distress.  Abdominal:     General: There is no distension.     Palpations: Abdomen is soft.  Musculoskeletal:        General: Tenderness present.     Cervical back: Neck supple.     Right lower leg: No edema.     Left lower leg: No edema.     Comments: Patient has good distal strength  with no pain over the greater trochanters.  No clonus or focal weakness.  Skin:    Findings: No erythema, lesion or rash.  Neurological:     General: No focal deficit present.     Mental Status: She is alert and oriented to person, place, and time.     Sensory: No sensory deficit.     Motor: No weakness or abnormal muscle tone.     Coordination: Coordination normal.  Psychiatric:        Mood and Affect: Mood normal.        Behavior: Behavior normal.     Imaging: No results found.

## 2021-06-17 NOTE — Procedures (Signed)
Lumbosacral Transforaminal Epidural Steroid Injection - Sub-Pedicular Approach with Fluoroscopic Guidance  [name]      Date of Birth: 09-Jun-1966 MRN: [id number] PCP: [name], MD      Visit Date: 06/12/2021   Universal Protocol:    Date/Time: 06/12/2021  Consent Given By: the patient  Position: PRONE  Additional Comments: Vital signs were monitored before and after the procedure. Patient was prepped and draped in the usual sterile fashion. The correct patient, procedure, and site was verified.   Injection Procedure Details:   Procedure diagnoses: Lumbar radiculopathy [M54.16]    Meds Administered:  Meds ordered this encounter  Medications   dexamethasone (DECADRON) injection 15 mg    Laterality: Left  Location/Site: L1  Needle:5.0 in., 22 ga.  Short bevel or Quincke spinal needle  Needle Placement: Transforaminal  Findings:    -Comments: Excellent flow of contrast along the nerve, nerve root and into the epidural space.  Procedure Details: After squaring off the end-plates to get a true AP view, the C-arm was positioned so that an oblique view of the foramen as noted above was visualized. The target area is just inferior to the "nose of the scotty dog" or sub pedicular. The soft tissues overlying this structure were infiltrated with 2-3 ml. of 1% Lidocaine without Epinephrine.  The spinal needle was inserted toward the target using a "trajectory" view along the fluoroscope beam.  Under AP and lateral visualization, the needle was advanced so it did not puncture dura and was located close the 6 O'Clock position of the pedical in AP tracterory. Biplanar projections were used to confirm position. Aspiration was confirmed to be negative for CSF and/or blood. A 1-2 ml. volume of Isovue-250 was injected and flow of contrast was noted at each level. Radiographs were obtained for documentation purposes.   After attaining the desired flow of contrast  documented above, a 0.5 to 1.0 ml test dose of 0.25% Marcaine was injected into each respective transforaminal space.  The patient was observed for 90 seconds post injection.  After no sensory deficits were reported, and normal lower extremity motor function was noted,   the above injectate was administered so that equal amounts of the injectate were placed at each foramen (level) into the transforaminal epidural space.   Additional Comments:  No complications occurred Dressing: 2 x 2 sterile gauze and Band-Aid    Post-procedure details: Patient was observed during the procedure. Post-procedure instructions were reviewed.  Patient left the clinic in stable condition.

## 2021-06-19 ENCOUNTER — Ambulatory Visit: Payer: 59 | Admitting: Obstetrics and Gynecology

## 2021-06-19 NOTE — Progress Notes (Deleted)
55 y.o. [id number] Married Black or [place] American Not Hispanic or Latino female here for lower abdominal pain.       Patient's last menstrual period was 08/17/2012.          Sexually active: {yes no:314532}  The current method of family planning is status post hysterectomy.   Hysterectomy done 09/03/2012 Dr. Toney Rakes  Exercising: {yes OE:423536}  {types:19826} Smoker:  {YES NO:22349}  Health Maintenance: Pap:  04/05/2011 WNL  History of abnormal Pap:  {YES NO:22349} MMG:  06/23/18 Density D Bi-rads 1 Neg  BMD:   *** Colonoscopy: 04/24/12 normal f/u 10 years  TDaP:  *** Gardasil: N/A   reports that she has quit smoking. Her smoking use included cigarettes. She has never used smokeless tobacco. She reports that she does not currently use alcohol. She reports current drug use.  Past Medical History:  Diagnosis Date   Anxiety    Back pain with radiation    Breast mass, right    Chronic pain    Chronic, continuous use of opioids    Complication of anesthesia 04/07/14   Allergic reaction to Lisinopril immediately following surgery   DJD (degenerative joint disease)    Fibromyalgia    Groin abscess    Headache(784.0)    History of IBS    Hypercholesterolemia    IBS (irritable bowel syndrome)    Lactose intolerance    Mild hypertension    Obesity    Tobacco use disorder    Umbilical hernia    Symptomatic    Past Surgical History:  Procedure Laterality Date   ABDOMINAL HYSTERECTOMY     ANTERIOR CERVICAL DECOMP/DISCECTOMY FUSION N/A 12/20/2015   Procedure: ANTERIOR CERVICAL DECOMPRESSION FUSION CERVICAL 4-5, CERVICAL 5-6, CERVICAL 6-7 WITH INSTRUMENTATION AND ALLOGRAFT;  Surgeon: Phylliss Bob, MD;  Location: [facility];  Service: Orthopedics;  Laterality: N/A;  Anterior cervical decompression fusion, cervical 4-5, cervical 5-6, cervical 6-7 with instrumentation and allograft   BACK SURGERY     BILATERAL SALPINGECTOMY  09/03/2012   Procedure: BILATERAL SALPINGECTOMY;  Surgeon: [name], MD;  Location: [facility];  Service: Gynecology;  Laterality: Bilateral;   BREAST BIOPSY Right 04/07/2014   Procedure: REMOVAL RIGHT BREAST MASS WITH WIRE LOCALIZATION;  Surgeon: [name], MD;  Location: [facility];  Service: General;  Laterality: Right;   BREAST EXCISIONAL BIOPSY Left    x2   BREAST LUMPECTOMY     x2   CARPAL TUNNEL RELEASE Left 05/11/2019   Procedure: LEFT CARPAL TUNNEL RELEASE, RIGHT TENNIS ELBOW MARCAINE/DEPO MEDROL INJECTION UNDER ANESTHESIA;  Surgeon: Jessy Oto, MD;  Location: [facility];  Service: Orthopedics;  Laterality: Left;   COLONOSCOPY W/ BIOPSIES  04/24/2012   per Dr. Carlean Purl, clear, repeat in 10 yrs    disectomy     ESOPHAGOGASTRODUODENOSCOPY     FINGER SURGERY     Right index-excision of mass    FOOT SURGERY Right    Bone Spurs   LAPAROSCOPIC HYSTERECTOMY  09/03/2012   Procedure: HYSTERECTOMY TOTAL LAPAROSCOPIC;  Surgeon: [name], MD;  Location: [facility];  Service: Gynecology;  Laterality: N/A;   LUMBAR DISC SURGERY     TUBAL LIGATION     UMBILICAL HERNIA REPAIR N/A 05/01/2018   Procedure: UMBILICAL HERNIA REPAIR;  Surgeon: Judeth Horn, MD;  Location: [facility];  Service: General;  Laterality: N/A;    Current Outpatient Medications  Medication Sig Dispense Refill   ALPRAZolam (XANAX) 0.5 MG tablet Take 1 tablet (0.5 mg total) by  mouth 3 (three) times daily as needed for anxiety. 90 tablet 5   cyclobenzaprine (FLEXERIL) 10 MG tablet Take 1 tablet (10 mg total) by mouth at bedtime. Take 10 mg by mouth once daily at bedtime 30 tablet 3   diclofenac (VOLTAREN) 75 MG EC tablet Take 1 tablet (75 mg total) by mouth 2 (two) times daily. 60 tablet 2   furosemide (LASIX) 40 MG tablet TAKE 2 TABLETS IN THE MORNING AND ONE IN THE EVENING (Patient taking differently: Take 40 mg by mouth in the morning and at bedtime.) 270 tablet 1   [START ON 07/24/2021] HYDROmorphone (DILAUDID) 4 MG tablet Take 1 tablet (4 mg total) by mouth every 6 (six) hours as  needed for severe pain. 120 tablet 0   metoprolol tartrate (LOPRESSOR) 50 MG tablet Take 1 tablet (50 mg total) by mouth 2 (two) times daily. 180 tablet 3   Multiple Vitamin (MULTIVITAMIN WITH MINERALS) TABS tablet Take 1 tablet by mouth daily.     ondansetron (ZOFRAN) 8 MG tablet Take 1 tablet (8 mg total) by mouth every 8 (eight) hours as needed for nausea or vomiting. 30 tablet 0   potassium chloride (KLOR-CON 10) 10 MEQ tablet Take 1 tablet (10 mEq total) by mouth 2 (two) times daily. (Patient taking differently: Take 10 mEq by mouth daily.) 180 tablet 3   temazepam (RESTORIL) 30 MG capsule TAKE 1 CAPSULE (30 MG TOTAL) BY MOUTH AT BEDTIME AS NEEDED FOR SLEEP. 30 capsule 5   No current facility-administered medications for this visit.    Family History  Problem Relation Age of Onset   Diabetes Father    Diabetes Mother    Prostate cancer Paternal Grandfather    Breast cancer Maternal Aunt 44    Review of Systems  Exam:   LMP 08/17/2012   Weight change: @WEIGHTCHANGE @ Height:      Ht Readings from Last 3 Encounters:  05/24/21 5\' 4"  (1.626 m)  12/20/20 5\' 4"  (1.626 m)  11/09/20 5\' 4"  (1.626 m)    General appearance: alert, cooperative and appears stated age Head: Normocephalic, without obvious abnormality, atraumatic Neck: no adenopathy, supple, symmetrical, trachea midline and thyroid {CHL AMB PHY EX THYROID NORM DEFAULT:[phone]::"normal to inspection and palpation"} Lungs: clear to auscultation bilaterally Cardiovascular: regular rate and rhythm Breasts: {Exam; breast:13139::"normal appearance, no masses or tenderness"} Abdomen: soft, non-tender; non distended,  no masses,  no organomegaly Extremities: extremities normal, atraumatic, no cyanosis or edema Skin: Skin color, texture, turgor normal. No rashes or lesions Lymph nodes: Cervical, supraclavicular, and axillary nodes normal. No abnormal inguinal nodes palpated Neurologic: Grossly normal   Pelvic: External  genitalia:  no lesions              Urethra:  normal appearing urethra with no masses, tenderness or lesions              Bartholins and Skenes: normal                 Vagina: normal appearing vagina with normal color and discharge, no lesions              Cervix: {CHL AMB PHY EX CERVIX NORM DEFAULT:[phone]::"no lesions"}               Bimanual Exam:  Uterus:  {CHL AMB PHY EX UTERUS NORM DEFAULT:[phone]::"normal size, contour, position, consistency, mobility, non-tender"}              Adnexa: {CHL AMB PHY EX ADNEXA NO MASS DEFAULT:[phone]::"no mass,  fullness, tenderness"}               Rectovaginal: Confirms               Anus:  normal sphincter tone, no lesions  *** chaperoned for the exam.  A:  Well Woman with normal exam  P:

## 2021-06-25 ENCOUNTER — Other Ambulatory Visit: Payer: Self-pay

## 2021-06-25 ENCOUNTER — Ambulatory Visit (INDEPENDENT_AMBULATORY_CARE_PROVIDER_SITE_OTHER): Payer: 59 | Admitting: Orthopedic Surgery

## 2021-06-25 ENCOUNTER — Ambulatory Visit: Payer: Self-pay

## 2021-06-25 DIAGNOSIS — M79641 Pain in right hand: Secondary | ICD-10-CM | POA: Diagnosis not present

## 2021-06-27 ENCOUNTER — Telehealth: Payer: Self-pay | Admitting: Physical Medicine and Rehabilitation

## 2021-06-27 DIAGNOSIS — M5416 Radiculopathy, lumbar region: Secondary | ICD-10-CM

## 2021-06-27 NOTE — Telephone Encounter (Signed)
Called to schedule appt.   CB [phone]

## 2021-06-28 NOTE — Telephone Encounter (Signed)
Called patient and scheduled NCV. She also states that she is having RIGHT sided low back pain with pain down posterior leg to foot. Left L1 TF on 9/27. Please advise.

## 2021-06-29 ENCOUNTER — Encounter: Payer: Self-pay | Admitting: Orthopedic Surgery

## 2021-06-29 NOTE — Addendum Note (Signed)
Addended by: Sherre Scarlet B on: 06/29/2021 11:39 AM   Modules accepted: Orders

## 2021-06-29 NOTE — Progress Notes (Signed)
Office Visit Note   [name]           Date of Birth: 1966/02/15           MRN: [id number] Visit Date: 06/25/2021 Requested by: Dana Morale, MD [place],  [place] [id number] PCP: Dana Morale, MD  Subjective: Chief Complaint  Patient presents with   Right Hand - Pain    HPI: Dana [name] is a patient with right hand pain.  Started 4 to 6 weeks ago.  She is helping to care for her husband who has had back surgery.  She is right-hand dominant.  Describes sharp pain and numbness involving all fingers.  Uses a cane with her right hand.  Denies any injury.  Improves with rest.  Denies any loss of dexterity.              ROS: All systems reviewed are negative as they relate to the chief complaint within the history of present illness.  Patient denies  fevers or chills.   Assessment & Plan: Visit Diagnoses:  1. Pain in right hand     Plan: Impression is right hand pain with some CMC pain as well as first dorsal compartment tenderness.  Also having some paresthesias consistent with carpal tunnel syndrome.  She has tried conservative measures without relief.  Plan at this time is nerve conduction study right upper extremity evaluate carpal tunnel syndrome and MRI right wrist to evaluate both first dorsal compartment as well as possible CMC ligament tear.  She does feel like she has some subluxation at the [facility] joint on the right but not the left.  This is on physical exam.  Need to look at the palmar oblique ligament if possible on MRI scan.  Follow-Up Instructions: No follow-ups on file.   Orders:  Orders Placed This Encounter  Procedures   XR Hand Complete Right   MR Wrist Right w/o contrast   Ambulatory referral to Physical Medicine Rehab   No orders of the defined types were placed in this encounter.     Procedures: No procedures performed   Clinical Data: No additional findings.  Objective: Vital Signs: LMP 08/17/2012   Physical Exam:    Constitutional: Patient appears well-developed HEENT:  Head: Normocephalic Eyes:EOM are normal Neck: Normal range of motion Cardiovascular: Normal rate Pulmonary/chest: Effort normal Neurologic: Patient is alert Skin: Skin is warm Psychiatric: Patient has normal mood and affect   Ortho Exam: Ortho exam demonstrates full active and passive range of motion of the right wrist.  Patient does have some subluxation manually at the [facility] joint.  Negative grind test.  Positive Finkelstein's test.  No swelling over the first dorsal compartment.  Radial pulse intact bilaterally.  Symmetric grip strength.  Intact EPL FPL interosseous function with no wasting.  Positive carpal tunnel compression testing with no subluxation or tenderness of the ulnar nerve at the elbow.  Specialty Comments:  No specialty comments available.  Imaging: No results found.   PMFS History: Patient Active Problem List   Diagnosis Date Noted   Carpal tunnel syndrome, left upper limb 05/11/2019    Class: Chronic   Spinal stenosis of lumbar region with neurogenic claudication 08/20/2018   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   Primary osteoarthritis of right knee 02/27/2017   Right tennis elbow 11/11/2016   Muscle cramps 01/15/2016   Bilateral leg edema 01/08/2016   Radiculopathy 12/20/2015   Hyperglycemia 12/21/2014   Mastodynia, female 11/30/2014  S/P excision of fibroadenoma of breast 11/30/2014   Angioedema of lips 04/07/2014   Fibroadenoma of right breast 03/07/2014   Pelvic pain in female 06/19/2012   Menorrhagia 06/11/2012   SUI (stress urinary incontinence, female) 06/11/2012   HTN (hypertension) 06/11/2012   IBS (irritable bowel syndrome) - diarrhea predominant 03/17/2012   MICROSCOPIC HEMATURIA 11/30/2010   BREAST PAIN, RIGHT 11/30/2010   BREAST [name], RIGHT 01/12/2010   HYPERCHOLESTEROLEMIA 12/07/2007   CIGARETTE SMOKER 12/07/2007   DEGENERATIVE JOINT DISEASE 12/07/2007   Low back pain  with sciatica 12/07/2007   HEADACHE 12/07/2007   Obesity 08/03/2007   Anxiety state 08/03/2007   Past Medical History:  Diagnosis Date   Anxiety    Back pain with radiation    Breast [name], right    Chronic pain    Chronic, continuous use of opioids    Complication of anesthesia 04/07/14   Allergic reaction to Lisinopril immediately following surgery   DJD (degenerative joint disease)    Fibromyalgia    Groin abscess    Headache(784.0)    History of IBS    Hypercholesterolemia    IBS (irritable bowel syndrome)    Lactose intolerance    Mild hypertension    Obesity    Tobacco use disorder    Umbilical hernia    Symptomatic    Family History  Problem Relation Age of Onset   Diabetes Father    Diabetes Mother    Prostate cancer Paternal Grandfather    Breast cancer Maternal Aunt 46    Past Surgical History:  Procedure Laterality Date   ABDOMINAL HYSTERECTOMY     ANTERIOR CERVICAL DECOMP/DISCECTOMY FUSION N/A 12/20/2015   Procedure: ANTERIOR CERVICAL DECOMPRESSION FUSION CERVICAL 4-5, CERVICAL 5-6, CERVICAL 6-7 WITH INSTRUMENTATION AND ALLOGRAFT;  Surgeon: Dana Bob, MD;  Location: [place];  Service: Orthopedics;  Laterality: N/A;  Anterior cervical decompression fusion, cervical 4-5, cervical 5-6, cervical 6-7 with instrumentation and allograft   BACK SURGERY     BILATERAL SALPINGECTOMY  09/03/2012   Procedure: BILATERAL SALPINGECTOMY;  Surgeon: Dana Mass, MD;  Location: [facility];  Service: Gynecology;  Laterality: Bilateral;   BREAST BIOPSY Right 04/07/2014   Procedure: REMOVAL RIGHT BREAST [name] WITH WIRE LOCALIZATION;  Surgeon: Dana Hollingshead, MD;  Location: [place];  Service: General;  Laterality: Right;   BREAST EXCISIONAL BIOPSY Left    x2   BREAST LUMPECTOMY     x2   CARPAL TUNNEL RELEASE Left 05/11/2019   Procedure: LEFT CARPAL TUNNEL RELEASE, RIGHT TENNIS ELBOW MARCAINE/DEPO MEDROL INJECTION UNDER ANESTHESIA;  Surgeon: Dana Oto, MD;  Location: [place];   Service: Orthopedics;  Laterality: Left;   COLONOSCOPY W/ BIOPSIES  04/24/2012   per Dr. Carlean Purl, clear, repeat in 10 yrs    disectomy     ESOPHAGOGASTRODUODENOSCOPY     FINGER SURGERY     Right index-excision of [name]    FOOT SURGERY Right    Bone Spurs   LAPAROSCOPIC HYSTERECTOMY  09/03/2012   Procedure: HYSTERECTOMY TOTAL LAPAROSCOPIC;  Surgeon: Dana Mass, MD;  Location: [facility];  Service: Gynecology;  Laterality: N/A;   LUMBAR DISC SURGERY     TUBAL LIGATION     UMBILICAL HERNIA REPAIR N/A 05/01/2018   Procedure: UMBILICAL HERNIA REPAIR;  Surgeon: Judeth Horn, MD;  Location: [place];  Service: General;  Laterality: N/A;   Social History   Occupational History   Occupation: Optometrist  Tobacco Use   Smoking status: Former    Years:  33.00    Types: Cigarettes   Smokeless tobacco: Never   Tobacco comments:    quit March 2017  Vaping Use   Vaping Use: Never used  Substance and Sexual Activity   Alcohol use: Not Currently    Alcohol/week: 0.0 standard drinks    Comment: occ   Drug use: Yes    Comment: chronic use of dilaudid   Sexual activity: Yes    Birth control/protection: Surgical

## 2021-07-06 ENCOUNTER — Encounter: Payer: 59 | Admitting: Physical Medicine and Rehabilitation

## 2021-07-06 ENCOUNTER — Telehealth: Payer: Self-pay | Admitting: Orthopedic Surgery

## 2021-07-06 ENCOUNTER — Telehealth: Payer: Self-pay | Admitting: Physical Medicine and Rehabilitation

## 2021-07-06 NOTE — Telephone Encounter (Signed)
Called patient to schedule MRI review with Dr Marlou Sa .     Patient said she had to cancel the NCS due to being sick.. Patient asked for a back to reschedule the NCS. Patient said she will schedule her appointment with Dr Marlou Sa after she have the NCS

## 2021-07-06 NOTE — Telephone Encounter (Signed)
Patient called needing to cancel her appointment due to be sick. The number to contact patient is  [phone]

## 2021-07-10 ENCOUNTER — Telehealth: Payer: Self-pay | Admitting: Physical Medicine and Rehabilitation

## 2021-07-10 NOTE — Telephone Encounter (Signed)
Patient called. Returning a call to [facility] with Dr. Ernestina Patches.

## 2021-07-11 NOTE — Telephone Encounter (Signed)
Called patient and scheduled appointment.

## 2021-07-17 ENCOUNTER — Other Ambulatory Visit: Payer: 59

## 2021-07-18 ENCOUNTER — Other Ambulatory Visit: Payer: Self-pay

## 2021-07-18 ENCOUNTER — Ambulatory Visit: Payer: Self-pay

## 2021-07-18 ENCOUNTER — Ambulatory Visit (INDEPENDENT_AMBULATORY_CARE_PROVIDER_SITE_OTHER): Payer: 59 | Admitting: Physical Medicine and Rehabilitation

## 2021-07-18 VITALS — BP 155/85 | HR 109

## 2021-07-18 DIAGNOSIS — M5416 Radiculopathy, lumbar region: Secondary | ICD-10-CM | POA: Diagnosis not present

## 2021-07-18 MED ORDER — DEXAMETHASONE SODIUM PHOSPHATE 10 MG/ML IJ SOLN
15.0000 mg | Freq: Once | INTRAMUSCULAR | Status: AC
  Administered 2021-07-18: 15 mg

## 2021-07-18 NOTE — Patient Instructions (Signed)

## 2021-07-18 NOTE — Progress Notes (Signed)
Pt state lower back pain, mostly on her right side. Pt has hx of inj on 06/12/21 Left L1 TF. Pt state walking, standing and bending makes the pain worse. Pt state she takes pain meds and uses ice to help ease her pain  Numeric Pain Rating Scale and Functional Assessment Average Pain 9   In the last MONTH (on 0-10 scale) has pain interfered with the following?  1. General activity like being  able to carry out your everyday physical activities such as walking, climbing stairs, carrying groceries, or moving a chair?  Rating(10)   +Driver, -BT, -Dye Allergies.

## 2021-07-21 NOTE — Progress Notes (Signed)
Dana [name] - 55 y.o. female MRN [id number]  Date of birth: 29-Jun-1966  Office Visit Note: Visit Date: 07/18/2021 PCP: [name], MD Referred by: [name], MD  Subjective: Chief Complaint  Patient presents with   Lower Back - Pain   HPI:  Dana [name] is a 55 y.o. female who comes in today at the request of Dr. Anderson Malta for planned Right L1-2 Lumbar Interlaminar epidural steroid injection with fluoroscopic guidance.  The patient has failed conservative care including home exercise, medications, time and activity modification.  This injection will be diagnostic and hopefully therapeutic.  Please see requesting physician notes for further details and justification. MRI reviewed with images and spine model.  MRI reviewed in the note below. Consider L4 level.  She continues to have pain management through her primary care physician Dr. Sarajane Jews.  She does take Dilaudid.  Would encourage weight loss and potential decompression surgery.   ROS Otherwise per HPI.  Assessment & Plan: Visit Diagnoses:    ICD-10-CM   1. Lumbar radiculopathy  M54.16 XR C-ARM NO REPORT    Epidural Steroid injection    dexamethasone (DECADRON) injection 15 mg      Plan: No additional findings.   Meds & Orders:  Meds ordered this encounter  Medications   dexamethasone (DECADRON) injection 15 mg    Orders Placed This Encounter  Procedures   XR C-ARM NO REPORT   Epidural Steroid injection    Follow-up: Return if symptoms worsen or fail to improve.   Procedures: No procedures performed  Lumbosacral Transforaminal Epidural Steroid Injection - Sub-Pedicular Approach with Fluoroscopic Guidance  [name]      Date of Birth: Jul 27, 1966 MRN: [id number] PCP: [name], MD      Visit Date: 07/18/2021   Universal Protocol:    Date/Time: 07/18/2021  Consent Given By: the patient  Position: PRONE  Additional Comments: Vital signs were monitored before and after the  procedure. Patient was prepped and draped in the usual sterile fashion. The correct patient, procedure, and site was verified.   Injection Procedure Details:   Procedure diagnoses: Lumbar radiculopathy [M54.16]    Meds Administered:  Meds ordered this encounter  Medications   dexamethasone (DECADRON) injection 15 mg    Laterality: Right  Location/Site: L1  Needle:5.0 in., 22 ga.  Short bevel or Quincke spinal needle  Needle Placement: Transforaminal  Findings:    -Comments: Excellent flow of contrast along the nerve, nerve root and into the epidural space.  Procedure Details: After squaring off the end-plates to get a true AP view, the C-arm was positioned so that an oblique view of the foramen as noted above was visualized. The target area is just inferior to the "nose of the scotty dog" or sub pedicular. The soft tissues overlying this structure were infiltrated with 2-3 ml. of 1% Lidocaine without Epinephrine.  The spinal needle was inserted toward the target using a "trajectory" view along the fluoroscope beam.  Under AP and lateral visualization, the needle was advanced so it did not puncture dura and was located close the 6 O'Clock position of the pedical in AP tracterory. Biplanar projections were used to confirm position. Aspiration was confirmed to be negative for CSF and/or blood. A 1-2 ml. volume of Isovue-250 was injected and flow of contrast was noted at each level. Radiographs were obtained for documentation purposes.   After attaining the desired flow of contrast documented above, a 0.5 to 1.0  ml test dose of 0.25% Marcaine was injected into each respective transforaminal space.  The patient was observed for 90 seconds post injection.  After no sensory deficits were reported, and normal lower extremity motor function was noted,   the above injectate was administered so that equal amounts of the injectate were placed at each foramen (level) into the transforaminal  epidural space.   Additional Comments:  The patient tolerated the procedure well Dressing: 2 x 2 sterile gauze and Band-Aid    Post-procedure details: Patient was observed during the procedure. Post-procedure instructions were reviewed.  Patient left the clinic in stable condition.     Clinical History: MRI LUMBAR SPINE WITHOUT CONTRAST   TECHNIQUE: Multiplanar, multisequence MR imaging of the lumbar spine was performed. No intravenous contrast was administered.   COMPARISON:  CT Abdomen and Pelvis 02/26/2018. Lumbar MRI 06/13/2017.   FINDINGS: Segmentation: Normal as seen on the comparison CT, which is the same numbering system used on the 2018 MRI.   Alignment: Stable vertebral height and alignment with mild straightening of lumbar lordosis. No spondylolisthesis.   Vertebrae: Chronic degenerative endplate marrow signal changes at L2-L3, sclerotic on the recent CT. Background bone marrow signal is within normal limits. No marrow edema or evidence of acute osseous abnormality. Intact visible sacrum and SI joints.   Conus medullaris and cauda equina: Conus extends to the L1 level. No lower spinal cord or conus signal abnormality.   Paraspinal and other soft tissues: Large body habitus. Stable visible abdominal viscera. Negative visualized posterior paraspinal soft tissues.   Disc levels:   T10-T11: Disc space loss with circumferential disc bulge. Mild posterior element hypertrophy. Mild spinal stenosis and left T10 foraminal stenosis. Possible mild associated spinal cord mass effect, but no definite cord signal abnormality.   T11-T12: Broad-based posterior disc bulge or protrusion. Borderline to mild spinal stenosis.   T12-L1: Progressed broad-based posterior disc bulging since 2018. Mild facet hypertrophy and epidural lipomatosis. New mild spinal stenosis. No mass effect on the conus.   L1-L2: Progressed epidural lipomatosis. Chronic circumferential  disc bulging which may also be mildly increased. Stable mild facet hypertrophy. Increased spinal stenosis, now mild to moderate. Mild left L1 foraminal stenosis is stable.   L2-L3: Chronic severe disc space loss. Chronic circumferential disc osteophyte complex. Interval stable epidural lipomatosis and mild to moderate facet hypertrophy. Stable mild to moderate spinal stenosis. Stable mild right L2 foraminal stenosis.   L3-L4: Chronic circumferential but mostly far lateral disc bulging and moderate facet and ligament flavum hypertrophy. Stable epidural lipomatosis and mild to moderate spinal stenosis. Stable mild right L3 foraminal stenosis.   L4-L5: Chronic broad-based posterior disc bulge or protrusion and moderate to severe facet hypertrophy is stable. Chronic degenerative facet joint fluid. Chronic epidural lipomatosis. Stable severe spinal stenosis (series 5, image 23). No significant foraminal stenosis.   L5-S1: Chronic broad-based posterior disc bulge or protrusion appears stable along with moderate bilateral facet hypertrophy. Stable epidural lipomatosis and mild spinal stenosis.   IMPRESSION: 1. Progressed spinal stenosis since 2018 at T12-L1 and L1-L2, mild to moderate, and related to increased disc bulging and epidural lipomatosis. 2. Stable severe multifactorial spinal stenosis at L4-L5, and moderate spinal stenosis at L2-L3 and L3-L4. 3. Mild lower thoracic spinal stenosis with up to mild spinal cord mass effect at T10-T11, but no lower spinal cord or conus signal abnormality. 4.  No acute osseous abnormality.     Electronically Signed   By: Genevie Ann M.D.   On: 04/16/2018 15:43  Objective:  VS:  HT:    WT:   BMI:     BP:(!) 155/85  HR:(!) 109bpm  TEMP: ( )  RESP:  Physical Exam Vitals and nursing note reviewed.  Constitutional:      General: She is not in acute distress.    Appearance: Normal appearance. She is obese. She is not ill-appearing.   HENT:     Head: Normocephalic and atraumatic.     Right Ear: External ear normal.     Left Ear: External ear normal.  Eyes:     Extraocular Movements: Extraocular movements intact.  Cardiovascular:     Rate and Rhythm: Normal rate.     Pulses: Normal pulses.  Pulmonary:     Effort: Pulmonary effort is normal. No respiratory distress.  Abdominal:     General: There is no distension.     Palpations: Abdomen is soft.  Musculoskeletal:        General: Tenderness present.     Cervical back: Neck supple.     Right lower leg: No edema.     Left lower leg: No edema.     Comments: Patient has good distal strength with no pain over the greater trochanters.  No clonus or focal weakness.  Skin:    Findings: No erythema, lesion or rash.  Neurological:     General: No focal deficit present.     Mental Status: She is alert and oriented to person, place, and time.     Sensory: No sensory deficit.     Motor: No weakness or abnormal muscle tone.     Coordination: Coordination normal.  Psychiatric:        Mood and Affect: Mood normal.        Behavior: Behavior normal.     Imaging: No results found.

## 2021-07-21 NOTE — Procedures (Signed)
Lumbosacral Transforaminal Epidural Steroid Injection - Sub-Pedicular Approach with Fluoroscopic Guidance  [name]      Date of Birth: [birth year] MRN: [id number] PCP: [name], MD      Visit Date: 07/18/2021   Universal Protocol:    Date/Time: 07/18/2021  Consent Given By: the patient  Position: PRONE  Additional Comments: Vital signs were monitored before and after the procedure. Patient was prepped and draped in the usual sterile fashion. The correct patient, procedure, and site was verified.   Injection Procedure Details:   Procedure diagnoses: Lumbar radiculopathy [M54.16]    Meds Administered:  Meds ordered this encounter  Medications   dexamethasone (DECADRON) injection 15 mg    Laterality: Right  Location/Site: L1  Needle:5.0 in., 22 ga.  Short bevel or Quincke spinal needle  Needle Placement: Transforaminal  Findings:    -Comments: Excellent flow of contrast along the nerve, nerve root and into the epidural space.  Procedure Details: After squaring off the end-plates to get a true AP view, the C-arm was positioned so that an oblique view of the foramen as noted above was visualized. The target area is just inferior to the "nose of the scotty dog" or sub pedicular. The soft tissues overlying this structure were infiltrated with 2-3 ml. of 1% Lidocaine without Epinephrine.  The spinal needle was inserted toward the target using a "trajectory" view along the fluoroscope beam.  Under AP and lateral visualization, the needle was advanced so it did not puncture dura and was located close the 6 O'Clock position of the pedical in AP tracterory. Biplanar projections were used to confirm position. Aspiration was confirmed to be negative for CSF and/or blood. A 1-2 ml. volume of Isovue-250 was injected and flow of contrast was noted at each level. Radiographs were obtained for documentation purposes.   After attaining the desired flow of contrast  documented above, a 0.5 to 1.0 ml test dose of 0.25% Marcaine was injected into each respective transforaminal space.  The patient was observed for 90 seconds post injection.  After no sensory deficits were reported, and normal lower extremity motor function was noted,   the above injectate was administered so that equal amounts of the injectate were placed at each foramen (level) into the transforaminal epidural space.   Additional Comments:  The patient tolerated the procedure well Dressing: 2 x 2 sterile gauze and Band-Aid    Post-procedure details: Patient was observed during the procedure. Post-procedure instructions were reviewed.  Patient left the clinic in stable condition.

## 2021-07-24 ENCOUNTER — Other Ambulatory Visit: Payer: Self-pay | Admitting: Specialist

## 2021-07-25 ENCOUNTER — Telehealth: Payer: Self-pay

## 2021-07-25 NOTE — Telephone Encounter (Signed)
Caller suspects gall bladder sxs. She has had severe abd pain, nausea, insomnia for 2 days/2pms. Asks if she needs to go to ER. Has not been able to eat. (mentions recent appts for pain management.)  ---Caller states she's having severe right-sided abdominal pain as well as right-sided back pain, nausea and insomnia 2 days. Says when she was making breakfast this morning she felt like she was going to fall, became weak. She says she's never felt like this before.  07/25/2021 12:02:43 PM Send to Urgent Vassie Loll  07/25/2021 12:11:22 PM 911 Outcome Documentation Sherrell Puller, RN, Amy Reason: THIS NURSE ADVISED PATIENT TO CALL 911 AND EXPLAINED I WOULD CALL HER BACK WITHIN IN A FEW MINUTES, PATIENT VERBALIZED UNDERSTANDING. NURSE CALLED BACK WITHIN 5 MINUTES AND PATIENT'S PHONE WENT DIRECTLY TO VOICEMAIL.  07/25/2021 12:11:02 PM Call EMS 911 Now Sherrell Puller, RN, Amy Caller Complies Caller Understands Yes  07/25/21 1552: Pt states she did not call paramedics b/c "I don't want to wait in the ED forever." Pt advised that her symptoms could indicate something serious & that it's a risk she's taking by not going. Pt states she has been unable to sleep x 2days b/c of right sided upper abdm pain that she describes as "irritating". Pt states she has been able to keep liquids down, however she is unable to keep solid food down. Vomited twice in last 24hrs; no blood noted.  Pt states she took hydromorphone that made her stomach hurt worse. Rates pain as 7 on 0-10 pain scale. Denies fever. Pt states she really prefers to see PCP for symptoms. Same day appt scheduled with [facility] for 07/26/21 at 11. Pt advised that if pain worsens, she begins vomiting & having more GI symptoms, passes out she should go to ED. Pt verb understanding.

## 2021-07-26 ENCOUNTER — Ambulatory Visit
Admission: RE | Admit: 2021-07-26 | Discharge: 2021-07-26 | Disposition: A | Discharge status: Home / Self Care | Payer: 59 | Source: Ambulatory Visit | Attending: Adult Health | Admitting: Adult Health

## 2021-07-26 ENCOUNTER — Ambulatory Visit (INDEPENDENT_AMBULATORY_CARE_PROVIDER_SITE_OTHER): Payer: 59 | Admitting: Adult Health

## 2021-07-26 ENCOUNTER — Encounter: Payer: Self-pay | Admitting: Adult Health

## 2021-07-26 ENCOUNTER — Telehealth: Payer: Self-pay | Admitting: Adult Health

## 2021-07-26 VITALS — BP 160/90 | HR 96 | Temp 98.4°F | Ht 64.0 in | Wt 298.0 lb

## 2021-07-26 DIAGNOSIS — R1011 Right upper quadrant pain: Secondary | ICD-10-CM

## 2021-07-26 DIAGNOSIS — R112 Nausea with vomiting, unspecified: Secondary | ICD-10-CM

## 2021-07-26 LAB — CBC WITH DIFFERENTIAL/PLATELET
Basophils Absolute: 0 10*3/uL (ref 0.0–0.1)
Basophils Relative: 0.5 % (ref 0.0–3.0)
Eosinophils Absolute: 0.1 10*3/uL (ref 0.0–0.7)
Eosinophils Relative: 1.1 % (ref 0.0–5.0)
HCT: 42.2 % (ref 36.0–46.0)
Hemoglobin: 13.5 g/dL (ref 12.0–15.0)
Lymphocytes Relative: 17.2 % (ref 12.0–46.0)
Lymphs Abs: 1.8 10*3/uL (ref 0.7–4.0)
MCHC: 32 g/dL (ref 30.0–36.0)
MCV: 99.5 fl (ref 78.0–100.0)
Monocytes Absolute: 0.6 10*3/uL (ref 0.1–1.0)
Monocytes Relative: 6 % (ref 3.0–12.0)
Neutro Abs: 8.1 10*3/uL — ABNORMAL HIGH (ref 1.4–7.7)
Neutrophils Relative %: 75.2 % (ref 43.0–77.0)
Platelets: 266 10*3/uL (ref 150.0–400.0)
RBC: 4.24 Mil/uL (ref 3.87–5.11)
RDW: 15.2 % (ref 11.5–15.5)
WBC: 10.7 10*3/uL — ABNORMAL HIGH (ref 4.0–10.5)

## 2021-07-26 LAB — URINALYSIS
Hgb urine dipstick: NEGATIVE
Leukocytes,Ua: NEGATIVE
Nitrite: NEGATIVE
Specific Gravity, Urine: 1.015 (ref 1.000–1.030)
Total Protein, Urine: NEGATIVE
Urine Glucose: NEGATIVE
Urobilinogen, UA: 1 (ref 0.0–1.0)
pH: 6 (ref 5.0–8.0)

## 2021-07-26 LAB — AMYLASE: Amylase: 30 U/L (ref 27–131)

## 2021-07-26 LAB — LIPASE: Lipase: 36 U/L (ref 11.0–59.0)

## 2021-07-26 MED ORDER — ONDANSETRON HCL 4 MG PO TABS: 4.0000 mg | ORAL_TABLET | Freq: Three times a day (TID) | ORAL | 0 refills | Status: DC | PRN

## 2021-07-26 MED ORDER — PANTOPRAZOLE SODIUM 40 MG PO TBEC: 40.0000 mg | DELAYED_RELEASE_TABLET | Freq: Every day | ORAL | 0 refills | Status: DC

## 2021-07-26 MED ORDER — SUCRALFATE 1 GM/10ML PO SUSP: 1.0000 g | Freq: Three times a day (TID) | ORAL | 0 refills | Status: DC

## 2021-07-26 NOTE — Addendum Note (Signed)
Addended by: Rosalyn Gess D on: 07/26/2021 11:43 AM   Modules accepted: Orders

## 2021-07-26 NOTE — Telephone Encounter (Signed)
Updated patient on her ultrasound as well as her labs.  Ultrasound came back showing no signs of acute gallbladder disease.  Labs are all stable.  She does use Voltaren 75 mg twice daily, cannot rule out marginal ulcer.  We will start her on a PPI for 30 days and send in Carafate.  She was advised against using her anti-inflammatory medications

## 2021-07-26 NOTE — Patient Instructions (Signed)
--  [name] PT SCHEDULED AT12:45, please have her be there at 12:00 pm [facility] Address: [address], [place], [place] [address] Phone: [phone]

## 2021-07-26 NOTE — Progress Notes (Signed)
Subjective:    Patient ID: Dana [name], female    DOB: [birth year], 55 y.o.   MRN: [id number]  HPI 55 year old female who  has a past medical history of Anxiety, Back pain with radiation, Breast mass, right, Chronic pain, Chronic, continuous use of opioids, Complication of anesthesia (04/07/14), DJD (degenerative joint disease), Fibromyalgia, Groin abscess, Headache(784.0), History of IBS, Hypercholesterolemia, IBS (irritable bowel syndrome), Lactose intolerance, Mild hypertension, Obesity, Tobacco use disorder, and Umbilical hernia.  She is a patient of Dr. Sarajane Jews who I am seeing today for an acute issue of abdominal pain x 3 days.   Pain is located in right upper quadrant and has been constant. Pain is described as stabbing pain that radiates to her lower back. She has been experiencing nausea and vomiting. Unable to keep any food or drink on her stomach.   She is on chronic dilaudid therapy for chronic pain syndrome. She does not feel like this is helping her.  She had diarrhea on the first day of her symptoms but no BM since    Review of Systems See HPI   Past Medical History:  Diagnosis Date   Anxiety    Back pain with radiation    Breast mass, right    Chronic pain    Chronic, continuous use of opioids    Complication of anesthesia 04/07/14   Allergic reaction to Lisinopril immediately following surgery   DJD (degenerative joint disease)    Fibromyalgia    Groin abscess    Headache(784.0)    History of IBS    Hypercholesterolemia    IBS (irritable bowel syndrome)    Lactose intolerance    Mild hypertension    Obesity    Tobacco use disorder    Umbilical hernia    Symptomatic    Social History   Socioeconomic History   Marital status: Married    Spouse name: Dhrithi Riche   Number of children: 4   Years of education: Not on file   Highest education level: Not on file  Occupational History   Occupation: Optometrist  Tobacco Use   Smoking status:  Former    Years: 33.00    Types: Cigarettes   Smokeless tobacco: Never   Tobacco comments:    quit March 2017  Vaping Use   Vaping Use: Never used  Substance and Sexual Activity   Alcohol use: Not Currently    Alcohol/week: 0.0 standard drinks    Comment: occ   Drug use: Yes    Comment: chronic use of dilaudid   Sexual activity: Yes    Birth control/protection: Surgical  Other Topics Concern   Not on file  Social History Narrative   Married - lives with husband 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-law   Works in [employer]   4 grown children - 1985, [place], [place] (twins)   Grandchildren - 4   Updated 10/12/2013         Social Determinants of Health   Financial Resource Strain: Not on file  Food Insecurity: Not on file  Transportation Needs: Not on file  Physical Activity: Not on file  Stress: Not on file  Social Connections: Not on file  Intimate Partner Violence: Not on file    Past Surgical History:  Procedure Laterality Date   ABDOMINAL HYSTERECTOMY     ANTERIOR CERVICAL DECOMP/DISCECTOMY FUSION N/A 12/20/2015   Procedure: ANTERIOR CERVICAL DECOMPRESSION FUSION CERVICAL 4-5, CERVICAL 5-6, CERVICAL 6-7 WITH INSTRUMENTATION AND ALLOGRAFT;  Surgeon: Phylliss Bob, MD;  Location:  [facility];  Service: Orthopedics;  Laterality: N/A;  Anterior cervical decompression fusion, cervical 4-5, cervical 5-6, cervical 6-7 with instrumentation and allograft   BACK SURGERY     BILATERAL SALPINGECTOMY  09/03/2012   Procedure: BILATERAL SALPINGECTOMY;  Surgeon: [name], MD;  Location: [facility];  Service: Gynecology;  Laterality: Bilateral;   BREAST BIOPSY Right 04/07/2014   Procedure: REMOVAL RIGHT BREAST MASS WITH WIRE LOCALIZATION;  Surgeon: [name], MD;  Location: [facility];  Service: General;  Laterality: Right;   BREAST EXCISIONAL BIOPSY Left    x2   BREAST LUMPECTOMY     x2   CARPAL TUNNEL RELEASE Left 05/11/2019   Procedure: LEFT CARPAL TUNNEL RELEASE, RIGHT TENNIS ELBOW MARCAINE/DEPO MEDROL  INJECTION UNDER ANESTHESIA;  Surgeon: Jessy Oto, MD;  Location: [facility];  Service: Orthopedics;  Laterality: Left;   COLONOSCOPY W/ BIOPSIES  04/24/2012   per Dr. Carlean Purl, clear, repeat in 10 yrs    disectomy     ESOPHAGOGASTRODUODENOSCOPY     FINGER SURGERY     Right index-excision of mass    FOOT SURGERY Right    Bone Spurs   LAPAROSCOPIC HYSTERECTOMY  09/03/2012   Procedure: HYSTERECTOMY TOTAL LAPAROSCOPIC;  Surgeon: [name], MD;  Location: [facility];  Service: Gynecology;  Laterality: N/A;   LUMBAR DISC SURGERY     TUBAL LIGATION     UMBILICAL HERNIA REPAIR N/A 05/01/2018   Procedure: UMBILICAL HERNIA REPAIR;  Surgeon: Judeth Horn, MD;  Location: [facility];  Service: General;  Laterality: N/A;    Family History  Problem Relation Age of Onset   Diabetes Father    Diabetes Mother    Prostate cancer Paternal Grandfather    Breast cancer Maternal Aunt 46    Allergies  Allergen Reactions   Lisinopril Anaphylaxis    was hospitalized for 3 days   Codeine Hives   Hydrocodone Hives   Penicillins Itching and Swelling    PATIENT HAS HAD A PCN REACTION WITH IMMEDIATE RASH, FACIAL/TONGUE/THROAT SWELLING, SOB, OR LIGHTHEADEDNESS WITH HYPOTENSION:  #  #  YES  #  #  Has patient had a PCN reaction causing severe rash involving mucus membranes or skin necrosis: No Has patient had a PCN reaction that required hospitalization No Has patient had a PCN reaction occurring within the last 10 years: No If all of the above answers are "NO", then may proceed with Cephalosporin use.    Amlodipine Swelling   Acetaminophen Rash    Current Outpatient Medications on File Prior to Visit  Medication Sig Dispense Refill   ALPRAZolam (XANAX) 0.5 MG tablet Take 1 tablet (0.5 mg total) by mouth 3 (three) times daily as needed for anxiety. 90 tablet 5   cyclobenzaprine (FLEXERIL) 10 MG tablet TAKE 1 TABLET BY MOUTH EVERYDAY AT BEDTIME 30 tablet 2   diclofenac (VOLTAREN) 75 MG EC tablet Take 1 tablet  (75 mg total) by mouth 2 (two) times daily. 60 tablet 2   furosemide (LASIX) 40 MG tablet TAKE 2 TABLETS IN THE MORNING AND ONE IN THE EVENING (Patient taking differently: Take 40 mg by mouth in the morning and at bedtime.) 270 tablet 1   HYDROmorphone (DILAUDID) 4 MG tablet Take 1 tablet (4 mg total) by mouth every 6 (six) hours as needed for severe pain. 120 tablet 0   metoprolol tartrate (LOPRESSOR) 50 MG tablet Take 1 tablet (50 mg total) by mouth 2 (two) times daily. 180 tablet 3   Multiple Vitamin (MULTIVITAMIN  WITH MINERALS) TABS tablet Take 1 tablet by mouth daily.     ondansetron (ZOFRAN) 8 MG tablet Take 1 tablet (8 mg total) by mouth every 8 (eight) hours as needed for nausea or vomiting. 30 tablet 0   potassium chloride (KLOR-CON 10) 10 MEQ tablet Take 1 tablet (10 mEq total) by mouth 2 (two) times daily. (Patient taking differently: Take 10 mEq by mouth daily.) 180 tablet 3   temazepam (RESTORIL) 30 MG capsule TAKE 1 CAPSULE (30 MG TOTAL) BY MOUTH AT BEDTIME AS NEEDED FOR SLEEP. 30 capsule 5   No current facility-administered medications on file prior to visit.    BP (!) 160/90   Pulse 96   Temp 98.4 F (36.9 C) (Oral)   Ht 5\' 4"  (1.626 m)   Wt 298 lb (135.2 kg)   LMP 08/17/2012   SpO2 100%   BMI 51.15 kg/m       Objective:   Physical Exam Vitals and nursing note reviewed.  Constitutional:      Appearance: Normal appearance. She is well-developed. She is obese.  Cardiovascular:     Rate and Rhythm: Normal rate and regular rhythm.     Pulses: Normal pulses.     Heart sounds: Normal heart sounds.  Pulmonary:     Effort: Pulmonary effort is normal.     Breath sounds: Normal breath sounds.  Abdominal:     Palpations: Abdomen is rigid.     Tenderness: There is abdominal tenderness in the right upper quadrant. There is no right CVA tenderness or left CVA tenderness. Positive signs include Murphy's sign.  Skin:    General: Skin is warm and dry.  Neurological:      General: No focal deficit present.     Mental Status: She is alert and oriented to person, place, and time.  Psychiatric:        Mood and Affect: Mood normal.        Behavior: Behavior normal.        Thought Content: Thought content normal.        Judgment: Judgment normal.      Assessment & Plan:  1. RUQ pain - Very tender on exam.  Likely cholecystitis. Will send for stat US  and do lab work  - US Abdomen Limited RUQ (LIVER/GB); Future - CBC with Differential/Platelet; Future - Lipase; Future - Amylase; Future - Urinalysis; Future  2. Nausea and vomiting, unspecified vomiting type  - ondansetron (ZOFRAN) 4 MG tablet; Take 1 tablet (4 mg total) by mouth every 8 (eight) hours as needed for nausea or vomiting.  Dispense: 20 tablet; Refill: 0  Dorothyann Peng, NP

## 2021-07-27 ENCOUNTER — Telehealth: Payer: Self-pay | Admitting: Physical Medicine and Rehabilitation

## 2021-07-27 ENCOUNTER — Encounter: Payer: 59 | Admitting: Physical Medicine and Rehabilitation

## 2021-07-27 NOTE — Telephone Encounter (Signed)
Patient states that she had to cancel her MRI. They advised that the auth for this ends on 11/27 and they do not have any appointments available before then. Can authorization for the MRI be extended?  Patient wants to wait to reschedule nerve study.

## 2021-07-27 NOTE — Telephone Encounter (Signed)
Patient called needing to cancel and reschedule her appointment. The number to contact patient is [phone]

## 2021-07-31 NOTE — Telephone Encounter (Signed)
Pt is to call to make appt and then call me back so I can extend the authorization.

## 2021-08-01 ENCOUNTER — Telehealth: Payer: Self-pay | Admitting: Physical Medicine and Rehabilitation

## 2021-08-01 NOTE — Telephone Encounter (Signed)
Pt called requesting a call back to set another appt for nerve study. Please call pt about this matter [phone].

## 2021-08-03 ENCOUNTER — Ambulatory Visit (INDEPENDENT_AMBULATORY_CARE_PROVIDER_SITE_OTHER): Payer: 59 | Admitting: Physical Medicine and Rehabilitation

## 2021-08-03 ENCOUNTER — Encounter: Payer: Self-pay | Admitting: Physical Medicine and Rehabilitation

## 2021-08-03 ENCOUNTER — Other Ambulatory Visit: Payer: Self-pay

## 2021-08-03 DIAGNOSIS — R202 Paresthesia of skin: Secondary | ICD-10-CM | POA: Diagnosis not present

## 2021-08-03 NOTE — Progress Notes (Signed)
Right thumb pain with certain movements. Swelling and aching right thumb.  Right hand dominant No lotion per patient

## 2021-08-04 ENCOUNTER — Other Ambulatory Visit: Payer: 59

## 2021-08-07 ENCOUNTER — Telehealth (INDEPENDENT_AMBULATORY_CARE_PROVIDER_SITE_OTHER): Payer: 59 | Admitting: Family Medicine

## 2021-08-07 ENCOUNTER — Other Ambulatory Visit: Payer: Self-pay

## 2021-08-07 ENCOUNTER — Encounter: Payer: Self-pay | Admitting: Family Medicine

## 2021-08-07 DIAGNOSIS — J019 Acute sinusitis, unspecified: Secondary | ICD-10-CM

## 2021-08-07 MED ORDER — DOXYCYCLINE HYCLATE 100 MG PO CAPS: 100.0000 mg | ORAL_CAPSULE | Freq: Two times a day (BID) | ORAL | 0 refills | Status: DC

## 2021-08-07 MED ORDER — BENZONATATE 100 MG PO CAPS: 100.0000 mg | ORAL_CAPSULE | Freq: Three times a day (TID) | ORAL | 0 refills | Status: DC | PRN

## 2021-08-07 NOTE — Progress Notes (Signed)
Dana [name] - 55 y.o. female MRN [id number]  Date of birth: 09-08-1966  Office Visit Note: Visit Date: 08/03/2021 PCP: [name], MD Referred by: [name], MD  Subjective: Chief Complaint  Patient presents with   Right Hand - Pain   HPI:  Dana [name] is a 55 y.o. female who comes in today at the request of Dr. Anderson Malta for electrodiagnostic study of the Right upper extremities.  Patient is Right hand dominant.  She reports chronic worsening severe right hand pain particularly pain into the right thumb with swelling aching and numbness at times.  She denies any frank radicular symptoms.  She has had a history of prior cervical fusion in 2017.  She has had no recent advanced cervical imaging.  She has a prior history of left carpal tunnel release in 2020.  Electrodiagnostic study in 2020 showed left median nerve neuropathy at the wrist which was moderate in nature.  The right hand was not done at that time.  She does get nocturnal complaints.  She does report this limits her daily activities.  Her case is complicated by history of fibromyalgia.  She is not diabetic.  ROS Otherwise per HPI.  Assessment & Plan: Visit Diagnoses:    ICD-10-CM   1. Paresthesia of skin  R20.2 NCV with EMG (electromyography)      Plan: Impression: The above electrodiagnostic study is ABNORMAL and reveals evidence of a moderate right median nerve entrapment at the wrist (carpal tunnel syndrome) affecting sensory and motor components.  There is also incidental finding of a Martin-Gruber anastomosis which is a normal variant.  There is no significant electrodiagnostic evidence of any other focal nerve entrapment, brachial plexopathy or cervical radiculopathy.   Recommendations: 1.  Follow-up with referring physician. 2.  Continue current management of symptoms. 3.  Continue use of resting splint at night-time and as needed during the day. 4.  Suggest surgical evaluation.  Meds &  Orders: No orders of the defined types were placed in this encounter.   Orders Placed This Encounter  Procedures   NCV with EMG (electromyography)    Follow-up: Return in about 2 weeks (around 08/17/2021) for  Anderson Malta, MD.   Procedures: No procedures performed  EMG & NCV Findings: Evaluation of the right median motor nerve showed prolonged distal onset latency (4.9 ms) and decreased conduction velocity (Elbow-Wrist, 42 m/s).  The right median (across palm) sensory nerve showed prolonged distal peak latency (Wrist, 4.9 ms) and prolonged distal peak latency (Palm, 2.8 ms).  The right ulnar sensory nerve showed prolonged distal peak latency (3.8 ms) and decreased conduction velocity (Wrist-5th Digit, 37 m/s).  All remaining nerves (as indicated in the following tables) were within normal limits.    All examined muscles (as indicated in the following table) showed no evidence of electrical instability.    Impression: The above electrodiagnostic study is ABNORMAL and reveals evidence of a moderate right median nerve entrapment at the wrist (carpal tunnel syndrome) affecting sensory and motor components.  There is also incidental finding of a Martin-Gruber anastomosis which is a normal variant.  There is no significant electrodiagnostic evidence of any other focal nerve entrapment, brachial plexopathy or cervical radiculopathy.   Recommendations: 1.  Follow-up with referring physician. 2.  Continue current management of symptoms. 3.  Continue use of resting splint at night-time and as needed during the day. 4.  Suggest surgical evaluation.  ___________________________ Wonda Olds Board Certified, American Board  of Physical Medicine and Rehabilitation    Nerve Conduction Studies Anti Sensory Summary Table   Stim Site NR Peak (ms) Norm Peak (ms) P-T Amp (V) Norm P-T Amp Site1 Site2 Delta-P (ms) Dist (cm) Vel (m/s) Norm Vel (m/s)  Right Median Acr Palm Anti Sensory (2nd Digit)   32.3C  Wrist    *4.9 <3.6 23.7 >10 Wrist Palm 2.1 0.0    Palm    *2.8 <2.0 5.3         Right Radial Anti Sensory (Base 1st Digit)  32.6C  Wrist    1.7 <3.1 5.6  Wrist Base 1st Digit 1.7 0.0    Right Ulnar Anti Sensory (5th Digit)  32.6C  Wrist    *3.8 <3.7 19.4 >15.0 Wrist 5th Digit 3.8 14.0 *37 >38   Motor Summary Table   Stim Site NR Onset (ms) Norm Onset (ms) O-P Amp (mV) Norm O-P Amp Site1 Site2 Delta-0 (ms) Dist (cm) Vel (m/s) Norm Vel (m/s)  Right Median Motor (Abd Poll Brev)  32.7C    martin-gruber  Wrist    *4.9 <4.2 8.3 >5 Elbow Wrist 5.3 22.5 *42 >50  Elbow    10.2  1.9  Axilla Elbow 3.6 0.0    Axilla    13.8  0.0         Right Ulnar Motor (Abd Dig Min)  32.9C  Wrist    3.6 <4.2 9.6 >3 B Elbow Wrist 3.4 22.5 66 >53  B Elbow    7.0  8.3  A Elbow B Elbow 1.7 12.0 71 >53  A Elbow    8.7  8.8          EMG   Side Muscle Nerve Root Ins Act Fibs Psw Amp Dur Poly Recrt Int Fraser Din Comment  Right Abd Poll Brev Median C8-T1 Nml Nml Nml Nml Nml 0 Nml Nml   Right 1stDorInt Ulnar C8-T1 Nml Nml Nml Nml Nml 0 Nml Nml   Right PronatorTeres Median C6-7 Nml Nml Nml Nml Nml 0 Nml Nml   Right Biceps Musculocut C5-6 Nml Nml Nml Nml Nml 0 Nml Nml   Right Deltoid Axillary C5-6 Nml Nml Nml Nml Nml 0 Nml Nml     Nerve Conduction Studies Anti Sensory Left/Right Comparison   Stim Site L Lat (ms) R Lat (ms) L-R Lat (ms) L Amp (V) R Amp (V) L-R Amp (%) Site1 Site2 L Vel (m/s) R Vel (m/s) L-R Vel (m/s)  Median Acr Palm Anti Sensory (2nd Digit)  32.3C  Wrist  *4.9   23.7  Wrist Palm     Palm  *2.8   5.3        Radial Anti Sensory (Base 1st Digit)  32.6C  Wrist  1.7   5.6  Wrist Base 1st Digit     Ulnar Anti Sensory (5th Digit)  32.6C  Wrist  *3.8   19.4  Wrist 5th Digit  *37    Motor Left/Right Comparison   Stim Site L Lat (ms) R Lat (ms) L-R Lat (ms) L Amp (mV) R Amp (mV) L-R Amp (%) Site1 Site2 L Vel (m/s) R Vel (m/s) L-R Vel (m/s)  Median Motor (Abd Poll Brev)  32.7C     martin-gruber  Wrist  *4.9   8.3  Elbow Wrist  *42   Elbow  10.2   1.9  Axilla Elbow     Axilla  13.8   0.0        Ulnar Motor (Abd Dig Min)  32.[place]  Wrist  3.6   9.6  B Elbow Wrist  66   B Elbow  7.0   8.3  A Elbow B Elbow  71   A Elbow  8.7   8.8           Waveforms:            Clinical History:      Objective:  VS:  HT:    WT:   BMI:     BP:   HR: bpm  TEMP: ( )  RESP:  Physical Exam Constitutional:      Appearance: She is obese.  Musculoskeletal:        General: Tenderness present. No swelling or deformity.     Comments: Inspection reveals no atrophy of the bilateral APB or FDI or hand intrinsics.  There is well-healed carpal tunnel release scar on the left.  There is no swelling, color changes, allodynia or dystrophic changes. There is 5 out of 5 strength in the bilateral wrist extension, finger abduction and long finger flexion. There is intact sensation to light touch in all dermatomal and peripheral nerve distributions. There is [name] equivocally positive Phalen's test on the right. There is a negative Hoffmann's test bilaterally.  Skin:    General: Skin is warm and dry.     Findings: No erythema or rash.  Neurological:     General: No focal deficit present.     Mental Status: She is alert and oriented to person, place, and time.     Motor: No weakness or abnormal muscle tone.     Coordination: Coordination normal.  Psychiatric:        Mood and Affect: Mood normal.        Behavior: Behavior normal.     Imaging: No results found.

## 2021-08-07 NOTE — Progress Notes (Signed)
Patient ID: Dana [name], female   DOB: 1966-09-15, 55 y.o.   MRN: [id number]  This visit type was conducted due to national recommendations for restrictions regarding the COVID-19 pandemic in an effort to limit this patient's exposure and mitigate transmission in our community.   Virtual Visit via Telephone Note  I connected with Dana [name] on 08/07/21 at 10:00 AM EST by telephone and verified that I am speaking with the correct person using two identifiers.   I discussed the limitations, risks, security and privacy concerns of performing an evaluation and management service by telephone and the availability of in person appointments. I also discussed with the patient that there may be a patient responsible charge related to this service. The patient expressed understanding and agreed to proceed.  Location patient: home Location provider: work or home office Participants present for the call: patient, provider Patient did not have a visit in the prior 7 days to address this/these issue(s).   History of Present Illness:  Dana [name] had onset of upper respiratory symptoms last Thursday.  She developed fairly severe cough, nasal congestion, sore throat.  She has some chills last weekend.  Her top temperature was 100.6.  No fever since then.  No known sick exposures.  Did not do any COVID testing.  She has noticed some bloody and thick colored nasal discharge past couple days with increasing facial pain.  She is a non-smoker.  Denies any dyspnea.  Past Medical History:  Diagnosis Date   Anxiety    Back pain with radiation    Breast mass, right    Chronic pain    Chronic, continuous use of opioids    Complication of anesthesia 04/07/14   Allergic reaction to Lisinopril immediately following surgery   DJD (degenerative joint disease)    Fibromyalgia    Groin abscess    Headache(784.0)    History of IBS    Hypercholesterolemia    IBS (irritable bowel syndrome)    Lactose intolerance     Mild hypertension    Obesity    Tobacco use disorder    Umbilical hernia    Symptomatic   Past Surgical History:  Procedure Laterality Date   ABDOMINAL HYSTERECTOMY     ANTERIOR CERVICAL DECOMP/DISCECTOMY FUSION N/A 12/20/2015   Procedure: ANTERIOR CERVICAL DECOMPRESSION FUSION CERVICAL 4-5, CERVICAL 5-6, CERVICAL 6-7 WITH INSTRUMENTATION AND ALLOGRAFT;  Surgeon: Phylliss Bob, MD;  Location: [facility];  Service: Orthopedics;  Laterality: N/A;  Anterior cervical decompression fusion, cervical 4-5, cervical 5-6, cervical 6-7 with instrumentation and allograft   BACK SURGERY     BILATERAL SALPINGECTOMY  09/03/2012   Procedure: BILATERAL SALPINGECTOMY;  Surgeon: [name], MD;  Location: [facility];  Service: Gynecology;  Laterality: Bilateral;   BREAST BIOPSY Right 04/07/2014   Procedure: REMOVAL RIGHT BREAST MASS WITH WIRE LOCALIZATION;  Surgeon: [name], MD;  Location: [facility];  Service: General;  Laterality: Right;   BREAST EXCISIONAL BIOPSY Left    x2   BREAST LUMPECTOMY     x2   CARPAL TUNNEL RELEASE Left 05/11/2019   Procedure: LEFT CARPAL TUNNEL RELEASE, RIGHT TENNIS ELBOW MARCAINE/DEPO MEDROL INJECTION UNDER ANESTHESIA;  Surgeon: Jessy Oto, MD;  Location: [facility];  Service: Orthopedics;  Laterality: Left;   COLONOSCOPY W/ BIOPSIES  04/24/2012   per Dr. Carlean Purl, clear, repeat in 10 yrs    disectomy     ESOPHAGOGASTRODUODENOSCOPY     FINGER SURGERY     Right index-excision of mass  FOOT SURGERY Right    Bone Spurs   LAPAROSCOPIC HYSTERECTOMY  09/03/2012   Procedure: HYSTERECTOMY TOTAL LAPAROSCOPIC;  Surgeon: [name], MD;  Location: [facility];  Service: Gynecology;  Laterality: N/A;   LUMBAR DISC SURGERY     TUBAL LIGATION     UMBILICAL HERNIA REPAIR N/A 05/01/2018   Procedure: UMBILICAL HERNIA REPAIR;  Surgeon: Judeth Horn, MD;  Location: [facility];  Service: General;  Laterality: N/A;    reports that she has quit smoking. Her smoking use included cigarettes. She  has never used smokeless tobacco. She reports that she does not currently use alcohol. She reports current drug use. family history includes Breast cancer (age of onset: 61) in her maternal aunt; Diabetes in her father and mother; Prostate cancer in her paternal grandfather. Allergies  Allergen Reactions   Lisinopril Anaphylaxis    was hospitalized for 3 days   Codeine Hives   Hydrocodone Hives   Penicillins Itching and Swelling    PATIENT HAS HAD A PCN REACTION WITH IMMEDIATE RASH, FACIAL/TONGUE/THROAT SWELLING, SOB, OR LIGHTHEADEDNESS WITH HYPOTENSION:  #  #  YES  #  #  Has patient had a PCN reaction causing severe rash involving mucus membranes or skin necrosis: No Has patient had a PCN reaction that required hospitalization No Has patient had a PCN reaction occurring within the last 10 years: No If all of the above answers are "NO", then may proceed with Cephalosporin use.    Amlodipine Swelling   Acetaminophen Rash      Observations/Objective: Patient sounds cheerful and well on the phone. I do not appreciate any SOB. Speech and thought processing are grossly intact. Patient reported vitals:  Assessment and Plan:  Upper respiratory symptoms.  Probably started viral.  She does have some bloody purulent nasal secretions at this time.  -We decided to go and cover for possible bacterial sinusitis with doxycycline 100 mg twice daily for 10 days.  She is penicillin allergic.  Tessalon Perles 100 mg every 8 hours as needed for cough -Follow-up for any recurrent fever or any worsening or persistent symptoms  Follow Up Instructions:    [id number] 5-10 [id number] 11-20 [id number] 21-30 I did not refer this patient for an OV in the next 24 hours for this/these issue(s).  I discussed the assessment and treatment plan with the patient. The patient was provided an opportunity to ask questions and all were answered. The patient agreed with the plan and demonstrated an understanding of the  instructions.   The patient was advised to call back or seek an in-person evaluation if the symptoms worsen or if the condition fails to improve as anticipated.  I provided 12 minutes of non-face-to-face time during this encounter.   Carolann Littler, MD

## 2021-08-07 NOTE — Procedures (Signed)
EMG & NCV Findings: Evaluation of the right median motor nerve showed prolonged distal onset latency (4.9 ms) and decreased conduction velocity (Elbow-Wrist, 42 m/s).  The right median (across palm) sensory nerve showed prolonged distal peak latency (Wrist, 4.9 ms) and prolonged distal peak latency (Palm, 2.8 ms).  The right ulnar sensory nerve showed prolonged distal peak latency (3.8 ms) and decreased conduction velocity (Wrist-5th Digit, 37 m/s).  All remaining nerves (as indicated in the following tables) were within normal limits.    All examined muscles (as indicated in the following table) showed no evidence of electrical instability.    Impression: The above electrodiagnostic study is ABNORMAL and reveals evidence of a moderate right median nerve entrapment at the wrist (carpal tunnel syndrome) affecting sensory and motor components.  There is also incidental finding of a Martin-Gruber anastomosis which is a normal variant.  There is no significant electrodiagnostic evidence of any other focal nerve entrapment, brachial plexopathy or cervical radiculopathy.   Recommendations: 1.  Follow-up with referring physician. 2.  Continue current management of symptoms. 3.  Continue use of resting splint at night-time and as needed during the day. 4.  Suggest surgical evaluation.  ___________________________ Laurence Spates FAAPMR Board Certified, American Board of Physical Medicine and Rehabilitation    Nerve Conduction Studies Anti Sensory Summary Table   Stim Site NR Peak (ms) Norm Peak (ms) P-T Amp (V) Norm P-T Amp Site1 Site2 Delta-P (ms) Dist (cm) Vel (m/s) Norm Vel (m/s)  Right Median Acr Palm Anti Sensory (2nd Digit)  32.3C  Wrist    *4.9 <3.6 23.7 >10 Wrist Palm 2.1 0.0    Palm    *2.8 <2.0 5.3         Right Radial Anti Sensory (Base 1st Digit)  32.6C  Wrist    1.7 <3.1 5.6  Wrist Base 1st Digit 1.7 0.0    Right Ulnar Anti Sensory (5th Digit)  32.6C  Wrist    *3.8 <3.7 19.4  >15.0 Wrist 5th Digit 3.8 14.0 *37 >38   Motor Summary Table   Stim Site NR Onset (ms) Norm Onset (ms) O-P Amp (mV) Norm O-P Amp Site1 Site2 Delta-0 (ms) Dist (cm) Vel (m/s) Norm Vel (m/s)  Right Median Motor (Abd Poll Brev)  32.7C    martin-gruber  Wrist    *4.9 <4.2 8.3 >5 Elbow Wrist 5.3 22.5 *42 >50  Elbow    10.2  1.9  Axilla Elbow 3.6 0.0    Axilla    13.8  0.0         Right Ulnar Motor (Abd Dig Min)  32.9C  Wrist    3.6 <4.2 9.6 >3 B Elbow Wrist 3.4 22.5 66 >53  B Elbow    7.0  8.3  A Elbow B Elbow 1.7 12.0 71 >53  A Elbow    8.7  8.8          EMG   Side Muscle Nerve Root Ins Act Fibs Psw Amp Dur Poly Recrt Int Fraser Din Comment  Right Abd Poll Brev Median C8-T1 Nml Nml Nml Nml Nml 0 Nml Nml   Right 1stDorInt Ulnar C8-T1 Nml Nml Nml Nml Nml 0 Nml Nml   Right PronatorTeres Median C6-7 Nml Nml Nml Nml Nml 0 Nml Nml   Right Biceps Musculocut C5-6 Nml Nml Nml Nml Nml 0 Nml Nml   Right Deltoid Axillary C5-6 Nml Nml Nml Nml Nml 0 Nml Nml     Nerve Conduction Studies Anti Sensory Left/Right Comparison  Stim Site L Lat (ms) R Lat (ms) L-R Lat (ms) L Amp (V) R Amp (V) L-R Amp (%) Site1 Site2 L Vel (m/s) R Vel (m/s) L-R Vel (m/s)  Median Acr Palm Anti Sensory (2nd Digit)  32.3C  Wrist  *4.9   23.7  Wrist Palm     Palm  *2.8   5.3        Radial Anti Sensory (Base 1st Digit)  32.6C  Wrist  1.7   5.6  Wrist Base 1st Digit     Ulnar Anti Sensory (5th Digit)  32.6C  Wrist  *3.8   19.4  Wrist 5th Digit  *37    Motor Left/Right Comparison   Stim Site L Lat (ms) R Lat (ms) L-R Lat (ms) L Amp (mV) R Amp (mV) L-R Amp (%) Site1 Site2 L Vel (m/s) R Vel (m/s) L-R Vel (m/s)  Median Motor (Abd Poll Brev)  32.7C    martin-gruber  Wrist  *4.9   8.3  Elbow Wrist  *42   Elbow  10.2   1.9  Axilla Elbow     Axilla  13.8   0.0        Ulnar Motor (Abd Dig Min)  32.9C  Wrist  3.6   9.6  B Elbow Wrist  66   B Elbow  7.0   8.3  A Elbow B Elbow  71   A Elbow  8.7   8.8           Waveforms:

## 2021-08-13 ENCOUNTER — Ambulatory Visit: Payer: 59 | Admitting: Orthopedic Surgery

## 2021-08-18 ENCOUNTER — Ambulatory Visit
Admission: RE | Admit: 2021-08-18 | Discharge: 2021-08-18 | Disposition: A | Discharge status: Home / Self Care | Payer: 59 | Source: Ambulatory Visit | Attending: Orthopedic Surgery | Admitting: Orthopedic Surgery

## 2021-08-18 ENCOUNTER — Other Ambulatory Visit: Payer: Self-pay

## 2021-08-18 DIAGNOSIS — M79641 Pain in right hand: Secondary | ICD-10-CM

## 2021-08-21 ENCOUNTER — Other Ambulatory Visit: Payer: Self-pay | Admitting: Adult Health

## 2021-08-22 ENCOUNTER — Ambulatory Visit: Payer: 59 | Admitting: Orthopedic Surgery

## 2021-09-04 ENCOUNTER — Other Ambulatory Visit: Payer: 59

## 2021-09-05 ENCOUNTER — Other Ambulatory Visit: Payer: Self-pay | Admitting: Family Medicine

## 2021-09-14 ENCOUNTER — Encounter: Payer: Self-pay | Admitting: Family Medicine

## 2021-09-14 ENCOUNTER — Telehealth: Payer: Self-pay

## 2021-09-14 ENCOUNTER — Telehealth (INDEPENDENT_AMBULATORY_CARE_PROVIDER_SITE_OTHER): Payer: 59 | Admitting: Family Medicine

## 2021-09-14 DIAGNOSIS — F1190 Opioid use, unspecified, uncomplicated: Secondary | ICD-10-CM | POA: Diagnosis not present

## 2021-09-14 DIAGNOSIS — M5440 Lumbago with sciatica, unspecified side: Secondary | ICD-10-CM | POA: Diagnosis not present

## 2021-09-14 DIAGNOSIS — G8929 Other chronic pain: Secondary | ICD-10-CM

## 2021-09-14 MED ORDER — HYDROMORPHONE HCL 4 MG PO TABS: 4.0000 mg | ORAL_TABLET | Freq: Four times a day (QID) | ORAL | 0 refills | Status: DC | PRN

## 2021-09-14 MED ORDER — HYDROMORPHONE HCL 4 MG PO TABS: 4.0000 mg | ORAL_TABLET | Freq: Four times a day (QID) | ORAL | 0 refills | Status: AC | PRN

## 2021-09-14 NOTE — Telephone Encounter (Signed)
Spoke with pt advised to pick up Rx from her pharmacy per Dr Sarajane Jews, verbalized understanding

## 2021-09-14 NOTE — Progress Notes (Signed)
° °  Subjective:    Patient ID: Dana [name], female    DOB: 1966-01-21, 55 y.o.   MRN: [id number]  HPI Virtual Visit via Telephone Note  I connected with the patient on 09/14/21 at  9:00 AM EST by telephone and verified that I am speaking with the correct person using two identifiers.   I discussed the limitations, risks, security and privacy concerns of performing an evaluation and management service by telephone and the availability of in person appointments. I also discussed with the patient that there may be a patient responsible charge related to this service. The patient expressed understanding and agreed to proceed.  Location patient: home Location provider: work or home office Participants present for the call: patient, provider Patient did not have a visit in the prior 7 days to address this/these issue(s).   History of Present Illness: Here for pain management. She is still struggling with back pain. She had an epidural steroid injection on 07-18-21 per Dr. Ernestina Patches, and this was helpful for awhile. She plans have another ESI in a few weeks.    Observations/Objective: Patient sounds cheerful and well on the phone. I do not appreciate any SOB. Speech and thought processing are grossly intact. Patient reported vitals:  Assessment and Plan: Pain management. Indication for chronic opioid: low back pain Medication and dose: Dilaudid 4 mg # pills per month: 120 Last UDS date: 05-15-20 Opioid Treatment Agreement signed (Y/N): 12-19-17 Opioid Treatment Agreement last reviewed with patient:  09-14-21 [facility] reviewed this encounter (include red flags): Yes We will only refill this for one month because she is past due for a urine drug screen. I told her we cannot provide any further refills until she gets this screen done, and she understands.  [name], MD   Follow Up Instructions:     [phone] 5-10 [phone] 11-20 [id number] 21-30 I did not refer this patient for an OV in the next  24 hours for this/these issue(s).  I discussed the assessment and treatment plan with the patient. The patient was provided an opportunity to ask questions and all were answered. The patient agreed with the plan and demonstrated an understanding of the instructions.   The patient was advised to call back or seek an in-person evaluation if the symptoms worsen or if the condition fails to improve as anticipated.  I provided 15 minutes of non-face-to-face time during this encounter.   [name], MD     Review of Systems     Objective:   Physical Exam        Assessment & Plan:

## 2021-09-14 NOTE — Telephone Encounter (Signed)
I am sorry I forgot to send these in this morning. I just sent them in

## 2021-09-14 NOTE — Addendum Note (Signed)
Addended by: [name] A on: 09/14/2021 03:14 PM   Modules accepted: Orders

## 2021-09-14 NOTE — Telephone Encounter (Signed)
Pt message sent to Dr Fry for advise 

## 2021-09-19 ENCOUNTER — Ambulatory Visit (INDEPENDENT_AMBULATORY_CARE_PROVIDER_SITE_OTHER): Payer: 59 | Admitting: Surgical

## 2021-09-19 ENCOUNTER — Other Ambulatory Visit: Payer: Self-pay

## 2021-09-19 ENCOUNTER — Other Ambulatory Visit: Payer: 59

## 2021-09-19 ENCOUNTER — Encounter: Payer: Self-pay | Admitting: Orthopedic Surgery

## 2021-09-19 DIAGNOSIS — M25531 Pain in right wrist: Secondary | ICD-10-CM | POA: Diagnosis not present

## 2021-09-19 DIAGNOSIS — M1711 Unilateral primary osteoarthritis, right knee: Secondary | ICD-10-CM | POA: Diagnosis not present

## 2021-09-19 DIAGNOSIS — M1712 Unilateral primary osteoarthritis, left knee: Secondary | ICD-10-CM

## 2021-09-19 DIAGNOSIS — F1190 Opioid use, unspecified, uncomplicated: Secondary | ICD-10-CM

## 2021-09-20 ENCOUNTER — Encounter: Payer: Self-pay | Admitting: Orthopedic Surgery

## 2021-09-20 MED ORDER — METHYLPREDNISOLONE ACETATE 40 MG/ML IJ SUSP
40.0000 mg | INTRAMUSCULAR | Status: AC | PRN
  Administered 2021-09-19: 40 mg via INTRA_ARTICULAR

## 2021-09-20 MED ORDER — LIDOCAINE HCL 1 % IJ SOLN
5.0000 mL | INTRAMUSCULAR | Status: AC | PRN
  Administered 2021-09-19: 5 mL

## 2021-09-20 MED ORDER — BUPIVACAINE HCL 0.25 % IJ SOLN
4.0000 mL | INTRAMUSCULAR | Status: AC | PRN
  Administered 2021-09-19: 4 mL via INTRA_ARTICULAR

## 2021-09-20 NOTE — Progress Notes (Signed)
Office Visit Note   [name]           Date of Birth: [birth year]           MRN: [id number] Visit Date: 09/19/2021 Requested by: [name], MD [place],  [place] [id number] PCP: [name], MD  Subjective: Chief Complaint  Patient presents with   Right Wrist - Follow-up    HPI: Dana [name] is a 56 y.o. female who presents to the office complaining of continued right wrist pain and bilateral knee pain.  She was scheduled to have MRI of the right wrist but was unable to have the MRI due to her severe claustrophobia and she had to request cancellation during the imaging procedure.  She notes no change in her right wrist pain that she was previously evaluated for.  She was evaluated with EMG/nerve conduction study by Dr. Ernestina Patches on 08/03/2021 with nerve study demonstrating moderate right carpal tunnel syndrome  Also complains of bilateral knee pain with history of bilateral knee osteoarthritis.  Last injections were back in September 2022.  She is having to use crutches or cane at times due to the severe pain.  Using Biofreeze and Pennsaid and hot showers without much relief.  She has no history of diabetes.  She is working on losing weight for 2023..                ROS: All systems reviewed are negative as they relate to the chief complaint within the history of present illness.  Patient denies fevers or chills.  Assessment & Plan: Visit Diagnoses:  1. Pain in right wrist     Plan: Patient is a 56 year old female who presents for repeat evaluation of right wrist and bilateral knee pain.  She was scheduled to have right wrist MRI but was not able to do this due to the claustrophobia she experienced during the imaging procedure.  Plan to reorder right wrist MRI with open MRI this time.  She will also try and take her prescribed Xanax prior to the procedure which she did not do last time.  She also had nerve conduction study demonstrating right  carpal tunnel syndrome moderately.  She will follow-up after MRI to review imaging and decide on best course of action.  Also has report of bilateral knee pain today that is pretty severe.  Good relief from injections in the past.  She has history of severe knee osteoarthritis.  She requests injections today.  No history of diabetes.  Tolerated the injections well.  Follow-Up Instructions: No follow-ups on file.   Orders:  Orders Placed This Encounter  Procedures   MR Wrist Right w/o contrast   No orders of the defined types were placed in this encounter.     Procedures: Large Joint Inj: bilateral knee on 09/19/2021 4:54 PM Indications: diagnostic evaluation, joint swelling and pain Details: 18 G 3.5 in needle, superolateral approach  Arthrogram: No  Medications (Right): 5 mL lidocaine 1 %; 4 mL bupivacaine 0.25 %; 40 mg methylPREDNISolone acetate 40 MG/ML Medications (Left): 5 mL lidocaine 1 %; 4 mL bupivacaine 0.25 %; 40 mg methylPREDNISolone acetate 40 MG/ML Outcome: tolerated well, no immediate complications Procedure, treatment alternatives, risks and benefits explained, specific risks discussed. Consent was given by the patient. Immediately prior to procedure a time out was called to verify the correct patient, procedure, equipment, support staff and site/side marked as required. Patient was prepped and draped in the usual  sterile fashion.      Clinical Data: No additional findings.  Objective: Vital Signs: LMP 08/17/2012   Physical Exam:  Constitutional: Patient appears well-developed HEENT:  Head: Normocephalic Eyes:EOM are normal Neck: Normal range of motion Cardiovascular: Normal rate Pulmonary/chest: Effort normal Neurologic: Patient is alert Skin: Skin is warm Psychiatric: Patient has normal mood and affect  Ortho Exam: Ortho exam demonstrates bilateral knees without effusion.  Tenderness over the medial and lateral joint lines of both knees.  0 degrees  extension of the left knee with 5 degree flexion contracture of the right knee.  No calf tenderness.  Negative Homans' sign bilaterally.  Specialty Comments:  No specialty comments available.  Imaging: No results found.   PMFS History: Patient Active Problem List   Diagnosis Date Noted   Carpal tunnel syndrome, left upper limb 05/11/2019    Class: Chronic   Spinal stenosis of lumbar region with neurogenic claudication 08/20/2018   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   Primary osteoarthritis of right knee 02/27/2017   Right tennis elbow 11/11/2016   Muscle cramps 01/15/2016   Bilateral leg edema 01/08/2016   Radiculopathy 12/20/2015   Hyperglycemia 12/21/2014   Mastodynia, female 11/30/2014   S/P excision of fibroadenoma of breast 11/30/2014   Angioedema of lips 04/07/2014   Fibroadenoma of right breast 03/07/2014   Pelvic pain in female 06/19/2012   Menorrhagia 06/11/2012   SUI (stress urinary incontinence, female) 06/11/2012   HTN (hypertension) 06/11/2012   IBS (irritable bowel syndrome) - diarrhea predominant 03/17/2012   MICROSCOPIC HEMATURIA 11/30/2010   BREAST PAIN, RIGHT 11/30/2010   BREAST MASS, RIGHT 01/12/2010   HYPERCHOLESTEROLEMIA 12/07/2007   CIGARETTE SMOKER 12/07/2007   DEGENERATIVE JOINT DISEASE 12/07/2007   Low back pain with sciatica 12/07/2007   HEADACHE 12/07/2007   Obesity 08/03/2007   Anxiety state 08/03/2007   Past Medical History:  Diagnosis Date   Anxiety    Back pain with radiation    Breast mass, right    Chronic pain    Chronic, continuous use of opioids    Complication of anesthesia 04/07/14   Allergic reaction to Lisinopril immediately following surgery   DJD (degenerative joint disease)    Fibromyalgia    Groin abscess    Headache(784.0)    History of IBS    Hypercholesterolemia    IBS (irritable bowel syndrome)    Lactose intolerance    Mild hypertension    Obesity    Tobacco use disorder    Umbilical hernia     Symptomatic    Family History  Problem Relation Age of Onset   Diabetes Father    Diabetes Mother    Prostate cancer Paternal Grandfather    Breast cancer Maternal Aunt 46    Past Surgical History:  Procedure Laterality Date   ABDOMINAL HYSTERECTOMY     ANTERIOR CERVICAL DECOMP/DISCECTOMY FUSION N/A 12/20/2015   Procedure: ANTERIOR CERVICAL DECOMPRESSION FUSION CERVICAL 4-5, CERVICAL 5-6, CERVICAL 6-7 WITH INSTRUMENTATION AND ALLOGRAFT;  Surgeon: Phylliss Bob, MD;  Location: [facility];  Service: Orthopedics;  Laterality: N/A;  Anterior cervical decompression fusion, cervical 4-5, cervical 5-6, cervical 6-7 with instrumentation and allograft   BACK SURGERY     BILATERAL SALPINGECTOMY  09/03/2012   Procedure: BILATERAL SALPINGECTOMY;  Surgeon: [name], MD;  Location: [facility];  Service: Gynecology;  Laterality: Bilateral;   BREAST BIOPSY Right 04/07/2014   Procedure: REMOVAL RIGHT BREAST MASS WITH WIRE LOCALIZATION;  Surgeon: [name], MD;  Location: [facility];  Service:  General;  Laterality: Right;   BREAST EXCISIONAL BIOPSY Left    x2   BREAST LUMPECTOMY     x2   CARPAL TUNNEL RELEASE Left 05/11/2019   Procedure: LEFT CARPAL TUNNEL RELEASE, RIGHT TENNIS ELBOW MARCAINE/DEPO MEDROL INJECTION UNDER ANESTHESIA;  Surgeon: Jessy Oto, MD;  Location: [facility];  Service: Orthopedics;  Laterality: Left;   COLONOSCOPY W/ BIOPSIES  04/24/2012   per Dr. Carlean Purl, clear, repeat in 10 yrs    disectomy     ESOPHAGOGASTRODUODENOSCOPY     FINGER SURGERY     Right index-excision of mass    FOOT SURGERY Right    Bone Spurs   LAPAROSCOPIC HYSTERECTOMY  09/03/2012   Procedure: HYSTERECTOMY TOTAL LAPAROSCOPIC;  Surgeon: [name], MD;  Location: [facility];  Service: Gynecology;  Laterality: N/A;   LUMBAR DISC SURGERY     TUBAL LIGATION     UMBILICAL HERNIA REPAIR N/A 05/01/2018   Procedure: UMBILICAL HERNIA REPAIR;  Surgeon: Judeth Horn, MD;  Location: [facility];  Service: General;  Laterality:  N/A;   Social History   Occupational History   Occupation: Optometrist  Tobacco Use   Smoking status: Former    Years: 33.00    Types: Cigarettes   Smokeless tobacco: Never   Tobacco comments:    quit March 2017  Vaping Use   Vaping Use: Never used  Substance and Sexual Activity   Alcohol use: Not Currently    Alcohol/week: 0.0 standard drinks    Comment: occ   Drug use: Yes    Comment: chronic use of dilaudid   Sexual activity: Yes    Birth control/protection: Surgical

## 2021-09-22 LAB — DRUG MONITOR, PANEL 1, W/CONF, URINE
Amphetamines: NEGATIVE ng/mL (ref <500)
Barbiturates: NEGATIVE ng/mL (ref <300)
Benzodiazepines: NEGATIVE ng/mL (ref <100)
Cocaine Metabolite: NEGATIVE ng/mL (ref <150)
Codeine: NEGATIVE ng/mL (ref <50)
Creatinine: 225.9 mg/dL (ref >=20.0)
Hydrocodone: NEGATIVE ng/mL (ref <50)
Hydromorphone: >10000 ng/mL — ABNORMAL HIGH (ref <50)
Marijuana Metabolite: 58 ng/mL — ABNORMAL HIGH (ref <5)
Marijuana Metabolite: POSITIVE ng/mL — AB (ref <20)
Methadone Metabolite: NEGATIVE ng/mL (ref <100)
Morphine: NEGATIVE ng/mL (ref <50)
Norhydrocodone: NEGATIVE ng/mL (ref <50)
Opiates: POSITIVE ng/mL — AB (ref <100)
Oxidant: NEGATIVE ug/mL (ref <200)
Oxycodone: NEGATIVE ng/mL (ref <100)
Phencyclidine: NEGATIVE ng/mL (ref <25)
pH: 5.4 (ref 4.5–9.0)

## 2021-09-22 LAB — DM TEMPLATE

## 2021-09-24 ENCOUNTER — Other Ambulatory Visit: Payer: Self-pay

## 2021-09-24 ENCOUNTER — Telehealth: Payer: Self-pay | Admitting: Family Medicine

## 2021-09-24 MED ORDER — CIPROFLOXACIN HCL 500 MG PO TABS: 500.0000 mg | ORAL_TABLET | Freq: Two times a day (BID) | ORAL | 0 refills | Status: AC

## 2021-09-24 NOTE — Telephone Encounter (Signed)
Pt called the office upset, states that she is having urgency/ frequency with urination pressure and burning  and Odor in her urine, Pt  needs antibiotics called into her pharmacy. Please advise

## 2021-09-24 NOTE — Telephone Encounter (Signed)
Call in Cipro 500 mg BID for 7 days  

## 2021-09-24 NOTE — Telephone Encounter (Signed)
Pt notified, apologized for yelling out this morning regarding message

## 2021-09-24 NOTE — Telephone Encounter (Signed)
Rx for Cipro sent to pt pharmacy per Dr Sarajane Jews orders

## 2021-09-24 NOTE — Telephone Encounter (Signed)
Pt is calling and had urine test on 09-19-2021  and thinks she has bladder infection and would like abx call  [id number] [address], [place] Phone:  [phone]  Fax:  [phone]

## 2021-10-15 ENCOUNTER — Ambulatory Visit: Payer: 59 | Admitting: Orthopedic Surgery

## 2021-10-26 ENCOUNTER — Ambulatory Visit: Payer: 59 | Admitting: Orthopedic Surgery

## 2021-10-31 ENCOUNTER — Telehealth: Payer: Self-pay | Admitting: Orthopedic Surgery

## 2021-10-31 NOTE — Telephone Encounter (Signed)
Received call from pt wanting to know dates of service. She stated then she would like copy of records and she will come in to sign release form. I explained to her once we have release form I will call when records are ready.

## 2021-11-12 ENCOUNTER — Ambulatory Visit: Payer: 59 | Admitting: Orthopedic Surgery

## 2021-11-13 ENCOUNTER — Telehealth: Payer: Self-pay | Admitting: Physical Medicine and Rehabilitation

## 2021-11-13 NOTE — Telephone Encounter (Signed)
Patient called. She would like an appointment with Dr. Ernestina Patches [phone]

## 2021-11-14 ENCOUNTER — Other Ambulatory Visit: Payer: Self-pay

## 2021-11-14 ENCOUNTER — Other Ambulatory Visit: Payer: Self-pay | Admitting: Family Medicine

## 2021-11-14 ENCOUNTER — Telehealth: Payer: Self-pay | Admitting: Family Medicine

## 2021-11-14 MED ORDER — NYSTATIN 100000 UNIT/ML MT SUSP: 5.0000 mL | OROMUCOSAL | 0 refills | Status: DC

## 2021-11-14 NOTE — Telephone Encounter (Signed)
Please take care of this RX ?

## 2021-11-14 NOTE — Telephone Encounter (Signed)
Pt Rx sent to her pharmacy per Dr Sarajane Jews ?

## 2021-11-14 NOTE — Telephone Encounter (Signed)
Pt call and stated she need a refill on Magic mouth wash sent to  ?[id number] [address], [place] Phone:  [phone]  ?Fax:  [phone]  ?  ? ?

## 2021-11-14 NOTE — Telephone Encounter (Signed)
Not on med list.  Pharmacy updated ?

## 2021-11-15 ENCOUNTER — Telehealth: Payer: Self-pay | Admitting: Physical Medicine and Rehabilitation

## 2021-11-15 ENCOUNTER — Other Ambulatory Visit: Payer: Self-pay

## 2021-11-15 MED ORDER — NYSTATIN 100000 UNIT/ML MT SUSP: 5.0000 mL | OROMUCOSAL | 0 refills | Status: DC

## 2021-11-15 NOTE — Telephone Encounter (Signed)
Pt called requesting a call for a referral to receive injection. Please call pt at [phone]. ?

## 2021-11-15 NOTE — Telephone Encounter (Signed)
[facility] mouth [facility] and advised her to send it to [facility] [address]. ? ?Patient is also requesting a phone call back. ? ?Please advise. ?

## 2021-11-15 NOTE — Telephone Encounter (Signed)
Received electronic notification this morning that [facility],and to send to [facility].    Call patient to make aware of changes.   Resent magic mouthwash to [facility], apparently never went through.   Resent again.  ?

## 2021-11-15 NOTE — Telephone Encounter (Signed)
Pt called the office this morning and the front office send me a message stating to call pt back regarding her Magic mouth wash Rx. Called pt back at around 11 am regarding message. Pt started to argue with me on the phone and not telling me  where she wanted her magic mouth wash Rx sent. Pt was rude in the conversations and hung up the phone before I could get further information regarding what  pharmacy she wanted Rx sent, Notified the office clinical supervisor Garnette Czech about pt attitude. ?

## 2021-11-19 ENCOUNTER — Other Ambulatory Visit: Payer: Self-pay | Admitting: Specialist

## 2021-11-22 ENCOUNTER — Telehealth: Payer: Self-pay | Admitting: Family Medicine

## 2021-11-22 NOTE — Telephone Encounter (Signed)
Last refill- 05/24/2021- 90 tabs, 5 refills ?Last VV-09/14/21 ? ?No future OV scheduled ?

## 2021-11-22 NOTE — Telephone Encounter (Signed)
Pt call and stated she need a refill on ALPRAZolam (XANAX) 0.5 MG tablet [facility]  [id number]  [phone] ?Fax:  [phone] ?  ? ?

## 2021-11-23 MED ORDER — ALPRAZOLAM 0.5 MG PO TABS: 0.5000 mg | ORAL_TABLET | Freq: Three times a day (TID) | ORAL | 5 refills | Status: DC | PRN

## 2021-11-23 NOTE — Telephone Encounter (Signed)
Done

## 2021-11-23 NOTE — Telephone Encounter (Signed)
Called patient to inform of prescription sent to pharmacy ?

## 2021-11-23 NOTE — Telephone Encounter (Signed)
Talked with patient and advised her of message per Benjiman Core.  Patient stated that she will give [place] a call back. ?

## 2021-11-27 ENCOUNTER — Encounter: Payer: Self-pay | Admitting: Family Medicine

## 2021-11-27 ENCOUNTER — Telehealth (INDEPENDENT_AMBULATORY_CARE_PROVIDER_SITE_OTHER): Payer: 59 | Admitting: Family Medicine

## 2021-11-27 ENCOUNTER — Telehealth: Payer: Self-pay | Admitting: Specialist

## 2021-11-27 VITALS — Temp 99.5°F

## 2021-11-27 DIAGNOSIS — R0981 Nasal congestion: Secondary | ICD-10-CM | POA: Diagnosis not present

## 2021-11-27 DIAGNOSIS — R059 Cough, unspecified: Secondary | ICD-10-CM | POA: Diagnosis not present

## 2021-11-27 MED ORDER — DOXYCYCLINE HYCLATE 100 MG PO TABS: 100.0000 mg | ORAL_TABLET | Freq: Two times a day (BID) | ORAL | 0 refills | Status: DC

## 2021-11-27 MED ORDER — ALBUTEROL SULFATE HFA 108 (90 BASE) MCG/ACT IN AERS: 2.0000 | INHALATION_SPRAY | Freq: Four times a day (QID) | RESPIRATORY_TRACT | 3 refills | Status: DC | PRN

## 2021-11-27 MED ORDER — FLUTICASONE PROPIONATE 50 MCG/ACT NA SUSP: 2.0000 | Freq: Every day | NASAL | 0 refills | Status: DC

## 2021-11-27 MED ORDER — BENZONATATE 100 MG PO CAPS: ORAL_CAPSULE | ORAL | 0 refills | Status: DC

## 2021-11-27 NOTE — Patient Instructions (Addendum)
?  HOME CARE TIPS: ? ?-COVID19 testing information: ?ForwardDrop.tn ? ?Most pharmacies also offer testing and home test kits. If the Covid19 test is positive and you desire antiviral treatment, please contact a [facility] or schedule a follow up virtual visit through your primary care office or through the [facility].  ?Other test to treat options: ?ConnectRV.is?click_source=alert ? ?-I sent the medication(s) we discussed to your pharmacy: ?Meds ordered this encounter  ?Medications  ? albuterol (VENTOLIN HFA) 108 (90 Base) MCG/ACT inhaler  ?  Sig: Inhale 2 puffs into the lungs every 6 (six) hours as needed for wheezing or shortness of breath.  ?  Dispense:  1 each  ?  Refill:  3  ? doxycycline (VIBRA-TABS) 100 MG tablet  ?  Sig: Take 1 tablet (100 mg total) by mouth 2 (two) times daily.  ?  Dispense:  14 tablet  ?  Refill:  0  ? benzonatate (TESSALON PERLES) 100 MG capsule  ?  Sig: 1-2 capsules up to twice daily as needed for cough  ?  Dispense:  30 capsule  ?  Refill:  0  ? fluticasone (FLONASE) 50 MCG/ACT nasal spray  ?  Sig: Place 2 sprays into both nostrils daily.  ?  Dispense:  16 g  ?  Refill:  0  ?   ? ?-can use if needed for fevers, aches and pains per instructions ? ?-can use nasal saline a few times per day if you have nasal congestion ? ?-stay hydrated, drink plenty of fluids and eat small healthy meals - avoid dairy ? ?-follow up with your doctor in 2-3 days unless improving and feeling better ? ?-stay home while sick, except to seek medical care. If you have COVID19, you will likely be contagious for 7-10 days. Flu or Influenza is likely contagious for about 7 days. Other respiratory viral infections remain contagious for 5-10+ days depending on the virus and many other factors. Wear a good mask that fits snugly (such as N95 or KN95) if around others to reduce the risk of transmission. ? ?It was nice to meet you today, and I really hope  you are feeling better soon. I help [facility] out with telemedicine visits on Tuesdays and Thursdays and am happy to help if you need a follow up virtual visit on those days. Otherwise, if you have any concerns or questions following this visit please schedule a follow up visit with your Primary Care doctor or seek care at a local urgent care clinic to avoid delays in care.  ? ? ?Seek in person care or schedule a follow up video visit promptly if your symptoms worsen, new concerns arise or you are not improving with treatment. Call 911 and/or seek emergency care if your symptoms are severe or life threatening. ? ? ?

## 2021-11-27 NOTE — Progress Notes (Signed)
Virtual Visit via Telephone Note ? ?I connected with Dana [name] on 11/27/21 at  3:20 PM EDT by telephone and verified that I am speaking with the correct person using two identifiers. ?  ?I discussed the limitations of performing an evaluation and management service by telephone and requested permission for a phone visit. The patient expressed understanding and agreed to proceed. ? ?Location patient:  [place] ?Location provider: work or home office ?Participants present for the call: patient, provider ?Patient did not have a visit with me in the prior 7 days to address this/these issue(s). ? ? ?History of Present Illness: ? ?Acute telemedicine visit for : ?-Onset: 5 days ago ?-Symptoms include:sore throat, slight headache, cough, lots of nasal congestion, low grade temp (99 today), diarrhea, nausea, feel like has a little wheezing ?-Denies: CP, SOB, vomiting ?-has not done covid testing ?-patient is requesting levaquin ?-Has tried: musinex ?-Pertinent past medical history: see below ?-Pertinent medication allergies:  ?Allergies  ?Allergen Reactions  ? Lisinopril Anaphylaxis  ?  was hospitalized for 3 days  ? Codeine Hives  ? Hydrocodone Hives  ? Penicillins Itching and Swelling  ?  PATIENT HAS HAD A PCN REACTION WITH IMMEDIATE RASH, FACIAL/TONGUE/THROAT SWELLING, SOB, OR LIGHTHEADEDNESS WITH HYPOTENSION:  #  #  YES  #  #  ?Has patient had a PCN reaction causing severe rash involving mucus membranes or skin necrosis: No ?Has patient had a PCN reaction that required hospitalization No ?Has patient had a PCN reaction occurring within the last 10 years: No ?If all of the above answers are "NO", then may proceed with Cephalosporin use. ?  ? Amlodipine Swelling  ? Acetaminophen Rash  ?-COVID-19 vaccine status: ?Immunization History  ?Administered Date(s) Administered  ? Influenza Split 07/16/2012, 06/12/2013  ? Pneumococcal Polysaccharide-23 09/04/2012  ? ? ?  ?Past Medical History:  ?Diagnosis Date  ? Anxiety   ? Back  pain with radiation   ? Breast mass, right   ? Chronic pain   ? Chronic, continuous use of opioids   ? Complication of anesthesia 04/07/14  ? Allergic reaction to Lisinopril immediately following surgery  ? DJD (degenerative joint disease)   ? Fibromyalgia   ? Groin abscess   ? Headache(784.0)   ? History of IBS   ? Hypercholesterolemia   ? IBS (irritable bowel syndrome)   ? Lactose intolerance   ? Mild hypertension   ? Obesity   ? Tobacco use disorder   ? Umbilical hernia   ? Symptomatic  ? ? ?Current Outpatient Medications on File Prior to Visit  ?Medication Sig Dispense Refill  ? ALPRAZolam (XANAX) 0.5 MG tablet Take 1 tablet (0.5 mg total) by mouth 3 (three) times daily as needed for anxiety. 90 tablet 5  ? cyclobenzaprine (FLEXERIL) 10 MG tablet TAKE 1 TABLET BY MOUTH EVERYDAY AT BEDTIME 30 tablet 2  ? furosemide (LASIX) 40 MG tablet TAKE 2 TABLETS IN THE MORNING AND ONE IN THE EVENING (Patient taking differently: Take 40 mg by mouth in the morning and at bedtime.) 270 tablet 1  ? HYDROmorphone (DILAUDID) 4 MG tablet Take 1 tablet (4 mg total) by mouth every 6 (six) hours as needed for severe pain. 120 tablet 0  ? magic mouthwash (nystatin, hydrocortisone, diphenhydrAMINE) suspension Swish and swallow 5 mLs as directed. 120 mL 0  ? metoprolol tartrate (LOPRESSOR) 50 MG tablet Take 1 tablet (50 mg total) by mouth 2 (two) times daily. 180 tablet 3  ? ondansetron (ZOFRAN) 4 MG tablet Take  1 tablet (4 mg total) by mouth every 8 (eight) hours as needed for nausea or vomiting. 20 tablet 0  ? potassium chloride (KLOR-CON 10) 10 MEQ tablet Take 1 tablet (10 mEq total) by mouth 2 (two) times daily. (Patient taking differently: Take 10 mEq by mouth daily.) 180 tablet 3  ? sucralfate (CARAFATE) 1 GM/10ML suspension Take 10 mLs (1 g total) by mouth 4 (four) times daily -  with meals and at bedtime. 420 mL 0  ? temazepam (RESTORIL) 30 MG capsule TAKE 1 CAPSULE (30 MG TOTAL) BY MOUTH AT BEDTIME AS NEEDED FOR SLEEP. 30 capsule  5  ? ?No current facility-administered medications on file prior to visit.  ? ? ?Observations/Objective: ?Patient sounds cheerful and well on the phone. ?I do not appreciate any SOB. ?Speech and thought processing are grossly intact. ?Patient reported vitals: ? ?Assessment and Plan: ? ?Cough, unspecified type ? ?Nasal congestion ? ?-we discussed possible serious and likely etiologies, options for evaluation and workup, limitations of telemedicine visit vs in person visit, treatment, treatment risks and precautions. Pt prefers to treat via telemedicine empirically rather than in person at this moment. Query viral resp illness most likely, possible covid19 vs other. Patient has not done covid testing - advised to test, testing info per patient instructions. Patient is requesting Levaquin - reports she feels she needs abx and that is the only thing that worked in the past. Had a lengthy discussion about appropriate abx use and risks with abx, particularly levaquin. She has opted to try alb, cough rx and initiation of doxy '100mg'$  bid x 7 days if worsening or not improving as expected. She also requests a nasal spray, since allergy season may be contributing opted for flonase 2 sprays each nostril daily x 2 weeks.  ?Work/School slipped offered: declined ?Advised to seek prompt in person care if worsening, new symptoms arise, or if is not improving with treatment as expected per our conversation of expected course. Discussed options for follow up care. Did let this patient know that I do telemedicine on Tuesdays and Thursdays for [facility] and those are the days I am logged into the system. Advised to schedule follow up visit with PCP, [facility] virtual visits or [facility] if any further questions or concerns to avoid delays in care. ?  ?I discussed the assessment and treatment plan with the patient. The patient was provided an opportunity to ask questions and all were answered. The patient agreed with the plan and demonstrated  an understanding of the instructions. ?  ? ?Follow Up Instructions: ? ?I did not refer this patient for an OV with me in the next 24 hours for this/these issue(s). ? ?I discussed the assessment and treatment plan with the patient. The patient was provided an opportunity to ask questions and all were answered. The patient agreed with the plan and demonstrated an understanding of the instructions. ? ? ?I spent 24 minutes on the date of this visit in the care of this patient. See summary of tasks completed to properly care for this patient in the detailed notes above which also included counseling of above, review of PMH, medications, allergies, evaluation of the patient and ordering and/or  instructing patient on testing and care options.   ? ? ?Lucretia Kern, DO  ? ?

## 2021-12-03 ENCOUNTER — Ambulatory Visit: Payer: 59 | Admitting: Physical Medicine and Rehabilitation

## 2021-12-07 ENCOUNTER — Other Ambulatory Visit: Payer: Self-pay | Admitting: Adult Health

## 2021-12-07 DIAGNOSIS — R112 Nausea with vomiting, unspecified: Secondary | ICD-10-CM

## 2021-12-19 ENCOUNTER — Telehealth: Payer: Self-pay | Admitting: Specialist

## 2021-12-19 ENCOUNTER — Other Ambulatory Visit: Payer: Self-pay | Admitting: Family Medicine

## 2021-12-19 ENCOUNTER — Encounter: Payer: Self-pay | Admitting: Specialist

## 2021-12-19 ENCOUNTER — Ambulatory Visit (INDEPENDENT_AMBULATORY_CARE_PROVIDER_SITE_OTHER): Payer: 59 | Admitting: Specialist

## 2021-12-19 ENCOUNTER — Ambulatory Visit (INDEPENDENT_AMBULATORY_CARE_PROVIDER_SITE_OTHER): Payer: 59

## 2021-12-19 VITALS — BP 169/71 | HR 89 | Ht 64.0 in | Wt 303.6 lb

## 2021-12-19 DIAGNOSIS — M5416 Radiculopathy, lumbar region: Secondary | ICD-10-CM | POA: Diagnosis not present

## 2021-12-19 DIAGNOSIS — M5136 Other intervertebral disc degeneration, lumbar region: Secondary | ICD-10-CM | POA: Diagnosis not present

## 2021-12-19 DIAGNOSIS — M1712 Unilateral primary osteoarthritis, left knee: Secondary | ICD-10-CM

## 2021-12-19 DIAGNOSIS — G894 Chronic pain syndrome: Secondary | ICD-10-CM | POA: Diagnosis not present

## 2021-12-19 DIAGNOSIS — M1711 Unilateral primary osteoarthritis, right knee: Secondary | ICD-10-CM | POA: Diagnosis not present

## 2021-12-19 MED ORDER — BUPIVACAINE HCL 0.5 % IJ SOLN
3.0000 mL | INTRAMUSCULAR | Status: AC | PRN
  Administered 2021-12-19: 3 mL via INTRA_ARTICULAR

## 2021-12-19 MED ORDER — METHYLPREDNISOLONE ACETATE 40 MG/ML IJ SUSP
40.0000 mg | INTRAMUSCULAR | Status: AC | PRN
  Administered 2021-12-19: 40 mg via INTRA_ARTICULAR

## 2021-12-19 MED ORDER — CYCLOBENZAPRINE HCL 10 MG PO TABS: ORAL_TABLET | ORAL | 2 refills | Status: DC

## 2021-12-19 NOTE — Telephone Encounter (Signed)
Patient called advised the pharmacy needed prior auth before her Rx for Flexeril can be filled.  Patient asked for a call back when she can pick up Rx. ?The number to contact patient is [phone] ?

## 2021-12-19 NOTE — Patient Instructions (Signed)
Plan: Knee is suffering from osteoarthritis, only real proven treatments are ?Weight loss, NSIADs like diclofenac and exercise. ?Well padded shoes help. ?Ice the knee that is suffering from osteoarthritis, only real proven treatments are ? ?Ice the knee 2-3 times a day 15-20 mins at a time.-3 times a day 15-20 mins at a time. Hot showers in the AM.  ?Injection with steroid may be of benefit. ?Hemp CBD capsules, amazon.com 5,000-7,000 mg per bottle, 60 capsules per bottle, take one capsule twice a day. ?Cane in theright hand to use with right leg weight bearing. ?See Dr. Marlou Sa for surgical consideration. ?Injection of the right knee done today. ?Avoid bending, stooping and avoid lifting weights greater than 10 lbs. ?Avoid prolong standing and walking. ?Avoid frequent bending and stooping  ?No lifting greater than 10 lbs. ?May use ice or moist heat for pain. ?Weight loss is of benefit. ?Handicap license is approved. ?Dr. Romona Curls secretary/Assistant will call to arrange for epidural steroid injection    ?Follow-Up Instructions: No follow-ups on file.   ? ?

## 2021-12-19 NOTE — Telephone Encounter (Signed)
I tried to do prior auth but per her plan it is not needed at this time.-- I called and advised patient of this. I adivsed that if they are seeing something else then they would need to fax our office what they are needing. ? ?This medication or product is on your plan's list of covered drugs. Prior authorization is not required at this time. If your pharmacy has questions regarding the processing of your prescription, please have them call the [facility] pharmacy help desk at [phone]. **Please note: This request was submitted electronically. Formulary lowering, tiering exception, cost reduction and/or pre-benefit determination review (including prospective Medicare hospice reviews) requests cannot be requested using this method of submission. Providers contact us at [phone] for further assistance ?

## 2021-12-19 NOTE — Progress Notes (Signed)
? ?Office Visit Note ?  ?[name]           ?Date of Birth: [birth year]           ?MRN: [id number] ?Visit Date: 12/19/2021 ?             ?Requested by: [name], MD ?[address] ?[place],  [name] [address] ?PCP: [name], MD ? ? ?Assessment & Plan: ?Visit Diagnoses:  ?1. Lumbar radiculopathy   ?2. Unilateral primary osteoarthritis, left knee   ?3. Unilateral primary osteoarthritis, right knee   ?4. Chronic pain syndrome   ? ? ?Plan: Knee is suffering from osteoarthritis, only real proven treatments are ?Weight loss, NSIADs like diclofenac and exercise. ?Well padded shoes help. ?Ice the knee that is suffering from osteoarthritis, only real proven treatments are ? ?Ice the knee 2-3 times a day 15-20 mins at a time.-3 times a day 15-20 mins at a time. Hot showers in the AM.  ?Injection with steroid may be of benefit. ?Hemp CBD capsules, amazon.com 5,000-7,000 mg per bottle, 60 capsules per bottle, take one capsule twice a day. ?Cane in theright hand to use with right leg weight bearing. ?See Dr. Marlou Sa for surgical consideration. ?Injection of the right knee done today. ?Avoid bending, stooping and avoid lifting weights greater than 10 lbs. ?Avoid prolong standing and walking. ?Avoid frequent bending and stooping  ?No lifting greater than 10 lbs. ?May use ice or moist heat for pain. ?Weight loss is of benefit. ?Handicap license is approved. ?Dr. Romona Curls secretary/Assistant will call to arrange for epidural steroid injection    ?Follow-Up Instructions: No follow-ups on file.   ? ?Follow-Up Instructions: No follow-ups on file.  ? ?Orders:  ?Orders Placed This Encounter  ?Procedures  ? XR Lumbar Spine 2-3 Views  ? ?No orders of the defined types were placed in this encounter. ? ? ? ? Procedures: ?Large Joint Inj: R knee on 12/19/2021 10:16 AM ?Indications: pain ?Details: 25 G 1.5 in needle, anterolateral approach ? ?Arthrogram: No ? ?Medications: 40 mg methylPREDNISolone acetate 40 MG/ML; 3  mL bupivacaine 0.5 % ?Outcome: tolerated well, no immediate complications ?Procedure, treatment alternatives, risks and benefits explained, specific risks discussed. Consent was given by the patient. Immediately prior to procedure a time out was called to verify the correct patient, procedure, equipment, support staff and site/side marked as required. Patient was prepped and draped in the usual sterile fashion.  ? ? ? ?Clinical Data: ?No additional findings. ? ? ?Subjective: ?Chief Complaint  ?Patient presents with  ? Lower Back - Pain  ? ? ?56 year old female with history of lumbar degenerative disc disease of the lumbar spine and bilateral knee OA. She has seen Dr. Marlou Sa and she is losing weight. Recent upper respiratory tract infection and pneumonia. She was on antibiotics and has discontinued at this point. She is receiving ESIs for her multiple level ? ? ?Review of Systems  ?Constitutional: Negative.   ?HENT: Negative.    ?Eyes: Negative.   ?Respiratory:  Positive for shortness of breath and wheezing.   ?Cardiovascular: Negative.   ?Gastrointestinal: Negative.   ?Endocrine: Negative.   ?Genitourinary: Negative.   ?Musculoskeletal: Negative.   ?Skin: Negative.   ?Allergic/Immunologic: Negative.   ?Neurological: Negative.   ?Hematological: Negative.   ?Psychiatric/Behavioral: Negative.    ?All other systems reviewed and are negative. ? ? ?Objective: ?Vital Signs: BP (!) 169/71 (BP Location: Left Arm, Patient Position: Sitting)   Pulse 89   Ht  $'5\' 4"'p$  (1.626 m)   Wt 298 lb (135.2 kg)   LMP 08/17/2012   BMI 51.15 kg/m?  ? ?Physical Exam ?Constitutional:   ?   Appearance: She is well-developed.  ?HENT:  ?   Head: Normocephalic and atraumatic.  ?Eyes:  ?   Pupils: Pupils are equal, round, and reactive to light.  ?Pulmonary:  ?   Effort: Pulmonary effort is normal.  ?   Breath sounds: Normal breath sounds.  ?Abdominal:  ?   General: Bowel sounds are normal.  ?   Palpations: Abdomen is soft.  ?Musculoskeletal:  ?    Cervical back: Normal range of motion and neck supple.  ?   Lumbar back: Negative right straight leg raise test and negative left straight leg raise test.  ?Skin: ?   General: Skin is warm and dry.  ?Neurological:  ?   Mental Status: She is alert and oriented to person, place, and time.  ?Psychiatric:     ?   Behavior: Behavior normal.     ?   Thought Content: Thought content normal.     ?   Judgment: Judgment normal.  ? ?Right Knee Exam  ? ?Tenderness  ?The patient is experiencing tenderness in the medial retinaculum, medial joint line and patella. ? ?Range of Motion  ?Extension:  -10 abnormal  ?Flexion:  90  ? ? ?Left Knee Exam  ?Left knee exam is normal. ? ? ?Back Exam  ? ?Tenderness  ?The patient is experiencing tenderness in the lumbar. ? ?Range of Motion  ?Extension:  abnormal  ?Flexion:  abnormal  ?Lateral bend right:  abnormal  ?Lateral bend left:  abnormal  ?Rotation right:  abnormal  ?Rotation left:  abnormal  ? ?Tests  ?Straight leg raise right: negative ?Straight leg raise left: negative ? ?Other  ?Toe walk: normal ?Heel walk: normal ? ? ? ?Specialty Comments:  ?No specialty comments available. ? ?Imaging: ?XR Lumbar Spine 2-3 Views ? ?Result Date: 12/19/2021 ?AP and lateral flexion ans extension radiographs of the lumbar spine demonstrate minimal 2-3 mm of anterolisthesis L3-4 and L4-5 with degenerative disc narrowing L2-3 through L5-S1. There is spondylosis changes throughout.  ? ? ?PMFS History: ?Patient Active Problem List  ? Diagnosis Date Noted  ? Carpal tunnel syndrome, left upper limb 05/11/2019  ?  Priority: High  ?  Class: Chronic  ? Spinal stenosis of lumbar region with neurogenic claudication 08/20/2018  ? 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  ? Primary osteoarthritis of right knee 02/27/2017  ? Right tennis elbow 11/11/2016  ? Muscle cramps 01/15/2016  ? Bilateral leg edema 01/08/2016  ? Radiculopathy 12/20/2015  ? Hyperglycemia 12/21/2014  ? Mastodynia, female 11/30/2014  ? S/P excision  of fibroadenoma of breast 11/30/2014  ? Angioedema of lips 04/07/2014  ? Fibroadenoma of right breast 03/07/2014  ? Pelvic pain in female 06/19/2012  ? Menorrhagia 06/11/2012  ? SUI (stress urinary incontinence, female) 06/11/2012  ? HTN (hypertension) 06/11/2012  ? IBS (irritable bowel syndrome) - diarrhea predominant 03/17/2012  ? MICROSCOPIC HEMATURIA 11/30/2010  ? BREAST PAIN, RIGHT 11/30/2010  ? BREAST MASS, RIGHT 01/12/2010  ? HYPERCHOLESTEROLEMIA 12/07/2007  ? CIGARETTE SMOKER 12/07/2007  ? DEGENERATIVE JOINT DISEASE 12/07/2007  ? Low back pain with sciatica 12/07/2007  ? HEADACHE 12/07/2007  ? Obesity 08/03/2007  ? Anxiety state 08/03/2007  ? ?Past Medical History:  ?Diagnosis Date  ? Anxiety   ? Back pain with radiation   ? Breast mass, right   ? Chronic pain   ?  Chronic, continuous use of opioids   ? Complication of anesthesia 04/07/14  ? Allergic reaction to Lisinopril immediately following surgery  ? DJD (degenerative joint disease)   ? Fibromyalgia   ? Groin abscess   ? Headache(784.0)   ? History of IBS   ? Hypercholesterolemia   ? IBS (irritable bowel syndrome)   ? Lactose intolerance   ? Mild hypertension   ? Obesity   ? Tobacco use disorder   ? Umbilical hernia   ? Symptomatic  ?  ?Family History  ?Problem Relation Age of Onset  ? Diabetes Father   ? Diabetes Mother   ? Prostate cancer Paternal Grandfather   ? Breast cancer Maternal Aunt 32  ?  ?Past Surgical History:  ?Procedure Laterality Date  ? ABDOMINAL HYSTERECTOMY    ? ANTERIOR CERVICAL DECOMP/DISCECTOMY FUSION N/A 12/20/2015  ? Procedure: ANTERIOR CERVICAL DECOMPRESSION FUSION CERVICAL 4-5, CERVICAL 5-6, CERVICAL 6-7 WITH INSTRUMENTATION AND ALLOGRAFT;  Surgeon: Phylliss Bob, MD;  Location: [facility];  Service: Orthopedics;  Laterality: N/A;  Anterior cervical decompression fusion, cervical 4-5, cervical 5-6, cervical 6-7 with instrumentation and allograft  ? BACK SURGERY    ? BILATERAL SALPINGECTOMY  09/03/2012  ? Procedure: BILATERAL  SALPINGECTOMY;  Surgeon: [name], MD;  Location: [facility];  Service: Gynecology;  Laterality: Bilateral;  ? BREAST BIOPSY Right 04/07/2014  ? Procedure: REMOVAL RIGHT BREAST MASS WITH WIRE LOCALIZATION;  Surgeon

## 2021-12-25 ENCOUNTER — Encounter: Payer: Self-pay | Admitting: Family Medicine

## 2021-12-25 ENCOUNTER — Telehealth (INDEPENDENT_AMBULATORY_CARE_PROVIDER_SITE_OTHER): Payer: 59 | Admitting: Family Medicine

## 2021-12-25 DIAGNOSIS — R112 Nausea with vomiting, unspecified: Secondary | ICD-10-CM | POA: Diagnosis not present

## 2021-12-25 DIAGNOSIS — M5440 Lumbago with sciatica, unspecified side: Secondary | ICD-10-CM

## 2021-12-25 DIAGNOSIS — F1190 Opioid use, unspecified, uncomplicated: Secondary | ICD-10-CM

## 2021-12-25 DIAGNOSIS — G8929 Other chronic pain: Secondary | ICD-10-CM

## 2021-12-25 MED ORDER — ONDANSETRON HCL 4 MG PO TABS: 4.0000 mg | ORAL_TABLET | Freq: Three times a day (TID) | ORAL | 2 refills | Status: DC | PRN

## 2021-12-25 MED ORDER — HYDROMORPHONE HCL 4 MG PO TABS: 4.0000 mg | ORAL_TABLET | Freq: Four times a day (QID) | ORAL | 0 refills | Status: AC | PRN

## 2021-12-25 MED ORDER — HYDROMORPHONE HCL 4 MG PO TABS: 4.0000 mg | ORAL_TABLET | Freq: Four times a day (QID) | ORAL | 0 refills | Status: DC | PRN

## 2021-12-25 MED ORDER — HYDROMORPHONE HCL 4 MG PO TABS
4.0000 mg | ORAL_TABLET | Freq: Four times a day (QID) | ORAL | 0 refills | Status: DC | PRN
Start: 2021-12-25 — End: 2021-12-25

## 2021-12-25 NOTE — Progress Notes (Signed)
? ?  Subjective:  ? ? Patient ID: Dana [name], female    DOB: 02-10-1966, 56 y.o.   MRN: [id number] ? ?HPI ?Virtual Visit via Telephone Note ? ?I connected with the patient on 12/25/21 at  3:30 PM EDT by telephone and verified that I am speaking with the correct person using two identifiers. ?  ?I discussed the limitations, risks, security and privacy concerns of performing an evaluation and management service by telephone and the availability of in person appointments. I also discussed with the patient that there may be a patient responsible charge related to this service. The patient expressed understanding and agreed to proceed. ? ?Location patient: home ?Location provider: work or home office ?Participants present for the call: patient, provider ?Patient did not have a visit in the prior 7 days to address this/these issue(s). ? ? ?History of Present Illness: ?Here for pain management. She is doing about the same she is scheduled for more ESI injections to her spine next week.  ?  ?Observations/Objective: ?Patient sounds cheerful and well on the phone. ?I do not appreciate any SOB. ?Speech and thought processing are grossly intact. ?Patient reported vitals: ? ?Assessment and Plan: ?Pain management. ?Indication for chronic opioid: low back pain ?Medication and dose: Dilaudid 4 mg ?# pills per month: 120 ?Last UDS date: 09-19-21 ?Opioid Treatment Agreement signed (Y/N): 12-19-17 ?Opioid Treatment Agreement last reviewed with patient:  12-25-21 ?[facility] reviewed this encounter (include red flags): Yes ?Meds were refilled.  ?[name], MD ? ? ?Follow Up Instructions: ? ? ? ? ?60630 5-10 ?99442 11-20 ?9443 21-30 ?I did not refer this patient for an OV in the next 24 hours for this/these issue(s). ? ?I discussed the assessment and treatment plan with the patient. The patient was provided an opportunity to ask questions and all were answered. The patient agreed with the plan and demonstrated an understanding of the  instructions. ?  ?The patient was advised to call back or seek an in-person evaluation if the symptoms worsen or if the condition fails to improve as anticipated. ? ?I provided 17 minutes of non-face-to-face time during this encounter. ? ? ?[name], MD   ? ? ?Review of Systems ? ?   ?Objective:  ? Physical Exam ? ? ? ? ?   ?Assessment & Plan:  ? ? ?

## 2021-12-31 ENCOUNTER — Ambulatory Visit (INDEPENDENT_AMBULATORY_CARE_PROVIDER_SITE_OTHER): Payer: 59 | Admitting: Family Medicine

## 2021-12-31 ENCOUNTER — Encounter: Payer: Self-pay | Admitting: Family Medicine

## 2021-12-31 VITALS — BP 148/96 | HR 98 | Temp 98.9°F | Wt 296.0 lb

## 2021-12-31 DIAGNOSIS — R109 Unspecified abdominal pain: Secondary | ICD-10-CM

## 2021-12-31 DIAGNOSIS — K56609 Unspecified intestinal obstruction, unspecified as to partial versus complete obstruction: Secondary | ICD-10-CM | POA: Diagnosis not present

## 2021-12-31 LAB — BASIC METABOLIC PANEL
BUN: 13 mg/dL (ref 6–23)
CO2: 25 mEq/L (ref 19–32)
Calcium: 9.4 mg/dL (ref 8.4–10.5)
Chloride: 100 mEq/L (ref 96–112)
Creatinine, Ser: 0.77 mg/dL (ref 0.40–1.20)
GFR: 86.3 mL/min (ref >60.00)
Glucose, Bld: 92 mg/dL (ref 70–99)
Potassium: 4.1 mEq/L (ref 3.5–5.1)
Sodium: 138 mEq/L (ref 135–145)

## 2021-12-31 LAB — CBC WITH DIFFERENTIAL/PLATELET
Basophils Absolute: 0.1 10*3/uL (ref 0.0–0.1)
Basophils Relative: 0.7 % (ref 0.0–3.0)
Eosinophils Absolute: 0.1 10*3/uL (ref 0.0–0.7)
Eosinophils Relative: 0.9 % (ref 0.0–5.0)
HCT: 39.5 % (ref 36.0–46.0)
Hemoglobin: 12.7 g/dL (ref 12.0–15.0)
Lymphocytes Relative: 17.7 % (ref 12.0–46.0)
Lymphs Abs: 1.6 10*3/uL (ref 0.7–4.0)
MCHC: 32.3 g/dL (ref 30.0–36.0)
MCV: 101.4 fl — ABNORMAL HIGH (ref 78.0–100.0)
Monocytes Absolute: 0.7 10*3/uL (ref 0.1–1.0)
Monocytes Relative: 7.6 % (ref 3.0–12.0)
Neutro Abs: 6.7 10*3/uL (ref 1.4–7.7)
Neutrophils Relative %: 73.1 % (ref 43.0–77.0)
Platelets: 255 10*3/uL (ref 150.0–400.0)
RBC: 3.9 Mil/uL (ref 3.87–5.11)
RDW: 17 % — ABNORMAL HIGH (ref 11.5–15.5)
WBC: 9.2 10*3/uL (ref 4.0–10.5)

## 2021-12-31 LAB — POC URINALSYSI DIPSTICK (AUTOMATED)
Bilirubin, UA: NEGATIVE
Blood, UA: NEGATIVE
Glucose, UA: NEGATIVE
Leukocytes, UA: NEGATIVE
Nitrite, UA: NEGATIVE
Protein, UA: POSITIVE — AB
Spec Grav, UA: >=1.03 — AB (ref 1.010–1.025)
Urobilinogen, UA: 1 E.U./dL
pH, UA: 5.5 (ref 5.0–8.0)

## 2021-12-31 NOTE — Progress Notes (Signed)
? ?  Subjective:  ? ? Patient ID: Dana [name], female    DOB: 1965-09-22, 56 y.o.   MRN: [id number] ? ?HPI ?Here for 4 weeks of constant sharp pains in the right flank and the right middle back area. These wax and wane but never stop. No fever. She has had constant nausea but has vomited only once. Her appetite is poor. She is urinating normally, but she has not had a BM for 3 days.  ? ? ?Review of Systems  ?Constitutional: Negative.   ?Respiratory: Negative.    ?Cardiovascular: Negative.   ?Gastrointestinal:  Positive for abdominal pain, nausea and vomiting. Negative for abdominal distention, blood in stool and diarrhea.  ?Genitourinary:  Positive for flank pain. Negative for dysuria, frequency and urgency.  ? ?   ?Objective:  ? Physical Exam ?Constitutional:   ?   Comments: In pain  ?Cardiovascular:  ?   Rate and Rhythm: Normal rate and regular rhythm.  ?   Pulses: Normal pulses.  ?   Heart sounds: Normal heart sounds.  ?Pulmonary:  ?   Effort: Pulmonary effort is normal.  ?   Breath sounds: Normal breath sounds.  ?Abdominal:  ?   General: Abdomen is flat. Bowel sounds are normal. There is no distension.  ?   Palpations: Abdomen is soft. There is no mass.  ?   Tenderness: There is no guarding or rebound.  ?   Hernia: No hernia is present.  ?   Comments: Very tender in the right middle back, right flank, and RUQ of the abdomen   ?Neurological:  ?   Mental Status: She is alert.  ? ? ? ? ? ?   ?Assessment & Plan:  ?Right flank pain, possibly due to gall bladder disease. Get a CBC and BMET today. Set up a contrasted abdominal CT asap. She is to go to the ED if she develops a fever.  ?[name], MD ? ? ?

## 2022-01-01 ENCOUNTER — Telehealth: Payer: Self-pay | Admitting: Physical Medicine and Rehabilitation

## 2022-01-01 NOTE — Telephone Encounter (Signed)
Patient called needing to reschedule her appointment. The number to contact patient is [phone]  ?

## 2022-01-02 ENCOUNTER — Ambulatory Visit: Payer: 59 | Admitting: Physical Medicine and Rehabilitation

## 2022-01-03 ENCOUNTER — Ambulatory Visit
Admission: RE | Admit: 2022-01-03 | Discharge: 2022-01-03 | Disposition: A | Discharge status: Home / Self Care | Payer: 59 | Source: Ambulatory Visit | Attending: Family Medicine | Admitting: Family Medicine

## 2022-01-03 DIAGNOSIS — R109 Unspecified abdominal pain: Secondary | ICD-10-CM

## 2022-01-03 MED ORDER — IOPAMIDOL (ISOVUE-300) INJECTION 61%
100.0000 mL | Freq: Once | INTRAVENOUS | Status: AC | PRN
  Administered 2022-01-03: 100 mL via INTRAVENOUS

## 2022-01-04 ENCOUNTER — Telehealth: Payer: Self-pay | Admitting: Family Medicine

## 2022-01-04 NOTE — Telephone Encounter (Addendum)
Pt is calling and would like ct scan abd results.  Pt is still in pain. Please advise ?

## 2022-01-07 NOTE — Addendum Note (Signed)
Addended by: [name] A on: 01/07/2022 07:59 AM ? ? Modules accepted: Orders ? ?

## 2022-01-07 NOTE — Telephone Encounter (Signed)
Spoke with pt this morning reviewed CT results with pt verbalized understanding ?

## 2022-01-08 ENCOUNTER — Telehealth: Payer: Self-pay | Admitting: Family Medicine

## 2022-01-08 NOTE — Telephone Encounter (Signed)
Patient already scheduled

## 2022-01-08 NOTE — Telephone Encounter (Signed)
Patient called because she would like to speak to someone regarding her referral. Patient states that she is in pain and she would like to speak with either Izora Gala or Referral coordinator. I let patient know that she could attempt to contact the other office and see if she could be schedule and give [place] a call back.  ? ? ? ? ?Please advise  ? ? ?

## 2022-01-09 ENCOUNTER — Other Ambulatory Visit: Payer: Self-pay | Admitting: Family Medicine

## 2022-01-10 ENCOUNTER — Other Ambulatory Visit (HOSPITAL_BASED_OUTPATIENT_CLINIC_OR_DEPARTMENT_OTHER): Payer: Self-pay | Admitting: General Surgery

## 2022-01-10 ENCOUNTER — Other Ambulatory Visit: Payer: Self-pay | Admitting: General Surgery

## 2022-01-10 DIAGNOSIS — K769 Liver disease, unspecified: Secondary | ICD-10-CM

## 2022-01-21 ENCOUNTER — Encounter (HOSPITAL_BASED_OUTPATIENT_CLINIC_OR_DEPARTMENT_OTHER): Payer: Self-pay

## 2022-01-21 ENCOUNTER — Ambulatory Visit (HOSPITAL_BASED_OUTPATIENT_CLINIC_OR_DEPARTMENT_OTHER)
Admission: RE | Admit: 2022-01-21 | Discharge: 2022-01-21 | Disposition: A | Discharge status: Home / Self Care | Payer: 59 | Source: Ambulatory Visit | Attending: General Surgery | Admitting: General Surgery

## 2022-01-21 DIAGNOSIS — K769 Liver disease, unspecified: Secondary | ICD-10-CM | POA: Insufficient documentation

## 2022-01-21 MED ORDER — IOHEXOL 300 MG/ML  SOLN
100.0000 mL | Freq: Once | INTRAMUSCULAR | Status: AC | PRN
  Administered 2022-01-21: 100 mL via INTRAVENOUS

## 2022-01-24 ENCOUNTER — Other Ambulatory Visit: Payer: 59

## 2022-01-25 ENCOUNTER — Telehealth: Payer: Self-pay | Admitting: Physical Medicine and Rehabilitation

## 2022-01-25 NOTE — Telephone Encounter (Signed)
Patient called needing to reschedule her appointment. The number to contact patient is [phone] ?

## 2022-01-29 ENCOUNTER — Ambulatory Visit: Payer: 59 | Admitting: Physical Medicine and Rehabilitation

## 2022-02-06 ENCOUNTER — Ambulatory Visit: Payer: 59 | Admitting: Podiatry

## 2022-02-26 ENCOUNTER — Ambulatory Visit: Payer: 59 | Admitting: Surgical

## 2022-02-27 ENCOUNTER — Telehealth: Payer: Self-pay | Admitting: Physical Medicine and Rehabilitation

## 2022-02-27 ENCOUNTER — Encounter: Payer: Self-pay | Admitting: Surgical

## 2022-02-27 ENCOUNTER — Ambulatory Visit (INDEPENDENT_AMBULATORY_CARE_PROVIDER_SITE_OTHER): Payer: 59

## 2022-02-27 ENCOUNTER — Ambulatory Visit (INDEPENDENT_AMBULATORY_CARE_PROVIDER_SITE_OTHER): Payer: 59 | Admitting: Surgical

## 2022-02-27 DIAGNOSIS — M75102 Unspecified rotator cuff tear or rupture of left shoulder, not specified as traumatic: Secondary | ICD-10-CM | POA: Diagnosis not present

## 2022-02-27 DIAGNOSIS — M12812 Other specific arthropathies, not elsewhere classified, left shoulder: Secondary | ICD-10-CM

## 2022-02-27 DIAGNOSIS — M1712 Unilateral primary osteoarthritis, left knee: Secondary | ICD-10-CM | POA: Diagnosis not present

## 2022-02-27 MED ORDER — METHYLPREDNISOLONE ACETATE 40 MG/ML IJ SUSP
40.0000 mg | INTRAMUSCULAR | Status: AC | PRN
  Administered 2022-02-27: 40 mg via INTRA_ARTICULAR

## 2022-02-27 MED ORDER — BUPIVACAINE HCL 0.5 % IJ SOLN
9.0000 mL | INTRAMUSCULAR | Status: AC | PRN
  Administered 2022-02-27: 9 mL via INTRA_ARTICULAR

## 2022-02-27 MED ORDER — LIDOCAINE HCL 1 % IJ SOLN
5.0000 mL | INTRAMUSCULAR | Status: AC | PRN
  Administered 2022-02-27: 5 mL

## 2022-02-27 MED ORDER — BUPIVACAINE HCL 0.25 % IJ SOLN
4.0000 mL | INTRAMUSCULAR | Status: AC | PRN
  Administered 2022-02-27: 4 mL via INTRA_ARTICULAR

## 2022-02-27 NOTE — Telephone Encounter (Signed)
Patient would like to reschedule her appt for 02/28/22, please call ASAP

## 2022-02-27 NOTE — Progress Notes (Addendum)
Office Visit Note   [name]           Date of Birth: June 02, 1966           MRN: [id number] Visit Date: 02/27/2022 Requested by: [name], MD [place],  [place] [address] PCP: [name], MD  Subjective: Chief Complaint  Patient presents with   Left Shoulder - Pain    HPI: Dana [name] is a 56 y.o. female who presents to the office complaining of left shoulder pain over the last 3 months.  Patient states that it hurts to raise her arm.  Taking pain medication but this does not help.  No recent injury.  She has history of rotator cuff arthropathy of the left shoulder with irreparable rotator cuff tears.  She cannot lay on her left side.  She has radiation of pain down her left arm to about the mid forearm.  She denies any numbness/tingling, burning pain, neck pain, scapular pain.  She also complains of continued left knee pain.  She has history of left knee and right knee osteoarthritis for which she is trying to lose weight so that she can have knee replacement eventually.  Recent injection by Dr. Louanne Skye several months ago into the right knee has given her good relief and she would like to have the left knee injected today.  She denies any history of diabetes.  She also denies any fevers, chills, recent illnesses or hospitalizations.              ROS: All systems reviewed are negative as they relate to the chief complaint within the history of present illness.  Patient denies fevers or chills.  Assessment & Plan: Visit Diagnoses:  1. Rotator cuff tear arthropathy of left shoulder     Plan: Patient is a 56 year old female who presents for evaluation of left shoulder pain.  She has had increased pain over the last 3 months.  She has history of rotator cuff irreparable tears and still has pretty well-preserved strength on exam today.  Mostly dealing with a lot of pain.  She would like to try injection in the left shoulder and the left knee today.   Injection successfully delivered and patient tolerated both procedures well.  Follow-up with the office in 6 weeks for clinical recheck mostly regarding if her left shoulder pain has recurred and further options for the left shoulder.  She wants to avoid surgery if at all possible.  Follow-Up Instructions: No follow-ups on file.   Orders:  Orders Placed This Encounter  Procedures   XR Shoulder Left   No orders of the defined types were placed in this encounter.     Procedures: Large Joint Inj: L glenohumeral on 02/27/2022 9:38 PM Indications: diagnostic evaluation and pain Details: 18 G 1.5 in needle, posterior approach  Arthrogram: No  Medications: 9 mL bupivacaine 0.5 %; 40 mg methylPREDNISolone acetate 40 MG/ML; 5 mL lidocaine 1 % Outcome: tolerated well, no immediate complications Procedure, treatment alternatives, risks and benefits explained, specific risks discussed. Consent was given by the patient. Immediately prior to procedure a time out was called to verify the correct patient, procedure, equipment, support staff and site/side marked as required. Patient was prepped and draped in the usual sterile fashion.    Large Joint Inj: L knee on 02/27/2022 9:38 PM Indications: diagnostic evaluation, joint swelling and pain Details: 18 G 1.5 in needle, superolateral approach  Arthrogram: No  Medications: 5 mL  lidocaine 1 %; 40 mg methylPREDNISolone acetate 40 MG/ML; 4 mL bupivacaine 0.25 % Outcome: tolerated well, no immediate complications Procedure, treatment alternatives, risks and benefits explained, specific risks discussed. Consent was given by the patient. Immediately prior to procedure a time out was called to verify the correct patient, procedure, equipment, support staff and site/side marked as required. Patient was prepped and draped in the usual sterile fashion.       Clinical Data: No additional findings.  Objective: Vital Signs: LMP 08/17/2012   Physical  Exam:  Constitutional: Patient appears well-developed HEENT:  Head: Normocephalic Eyes:EOM are normal Neck: Normal range of motion Cardiovascular: Normal rate Pulmonary/chest: Effort normal Neurologic: Patient is alert Skin: Skin is warm Psychiatric: Patient has normal mood and affect  Ortho Exam: Ortho exam demonstrates left shoulder with passive and active range of motion severely limited by pain.  She has decent 5 -/5 strength of external rotation, supraspinatus, subscapularis of the left shoulder.  Tenderness over the bicipital groove moderately and over the [facility] joint moderately.  No tenderness throughout the axial cervical spine.  5/5 motor strength of bilateral grip strength, finger abduction, pronation/supination, bicep, tricep, deltoid.  Left knee with no cellulitis or skin changes noted.  No significant effusion noted.  No calf tenderness.  Negative Homans' sign.  Patient is able to perform straight leg raise.  Specialty Comments:  No specialty comments available.  Imaging: No results found.   PMFS History: Patient Active Problem List   Diagnosis Date Noted   Carpal tunnel syndrome, left upper limb 05/11/2019    Class: Chronic   Spinal stenosis of lumbar region with neurogenic claudication 08/20/2018   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   Primary osteoarthritis of right knee 02/27/2017   Right tennis elbow 11/11/2016   Muscle cramps 01/15/2016   Bilateral leg edema 01/08/2016   Radiculopathy 12/20/2015   Hyperglycemia 12/21/2014   Mastodynia, female 11/30/2014   S/P excision of fibroadenoma of breast 11/30/2014   Angioedema of lips 04/07/2014   Fibroadenoma of right breast 03/07/2014   Pelvic pain in female 06/19/2012   Menorrhagia 06/11/2012   SUI (stress urinary incontinence, female) 06/11/2012   HTN (hypertension) 06/11/2012   IBS (irritable bowel syndrome) - diarrhea predominant 03/17/2012   MICROSCOPIC HEMATURIA 11/30/2010   BREAST PAIN, RIGHT  11/30/2010   BREAST MASS, RIGHT 01/12/2010   HYPERCHOLESTEROLEMIA 12/07/2007   CIGARETTE SMOKER 12/07/2007   DEGENERATIVE JOINT DISEASE 12/07/2007   Low back pain with sciatica 12/07/2007   HEADACHE 12/07/2007   Obesity 08/03/2007   Anxiety state 08/03/2007   Past Medical History:  Diagnosis Date   Anxiety    Back pain with radiation    Breast mass, right    Chronic pain    Chronic, continuous use of opioids    Complication of anesthesia 04/07/14   Allergic reaction to Lisinopril immediately following surgery   DJD (degenerative joint disease)    Fibromyalgia    Groin abscess    Headache(784.0)    History of IBS    Hypercholesterolemia    IBS (irritable bowel syndrome)    Lactose intolerance    Mild hypertension    Obesity    Tobacco use disorder    Umbilical hernia    Symptomatic    Family History  Problem Relation Age of Onset   Diabetes Father    Diabetes Mother    Prostate cancer Paternal Grandfather    Breast cancer Maternal Aunt 80    Past Surgical History:  Procedure Laterality Date  ABDOMINAL HYSTERECTOMY     ANTERIOR CERVICAL DECOMP/DISCECTOMY FUSION N/A 12/20/2015   Procedure: ANTERIOR CERVICAL DECOMPRESSION FUSION CERVICAL 4-5, CERVICAL 5-6, CERVICAL 6-7 WITH INSTRUMENTATION AND ALLOGRAFT;  Surgeon: Phylliss Bob, MD;  Location: [facility];  Service: Orthopedics;  Laterality: N/A;  Anterior cervical decompression fusion, cervical 4-5, cervical 5-6, cervical 6-7 with instrumentation and allograft   BACK SURGERY     BILATERAL SALPINGECTOMY  09/03/2012   Procedure: BILATERAL SALPINGECTOMY;  Surgeon: [name], MD;  Location: [facility];  Service: Gynecology;  Laterality: Bilateral;   BREAST BIOPSY Right 04/07/2014   Procedure: REMOVAL RIGHT BREAST MASS WITH WIRE LOCALIZATION;  Surgeon: [name], MD;  Location: [facility];  Service: General;  Laterality: Right;   BREAST EXCISIONAL BIOPSY Left    x2   BREAST LUMPECTOMY     x2   CARPAL TUNNEL RELEASE Left  05/11/2019   Procedure: LEFT CARPAL TUNNEL RELEASE, RIGHT TENNIS ELBOW MARCAINE/DEPO MEDROL INJECTION UNDER ANESTHESIA;  Surgeon: Jessy Oto, MD;  Location: [facility];  Service: Orthopedics;  Laterality: Left;   COLONOSCOPY W/ BIOPSIES  04/24/2012   per Dr. Carlean Purl, clear, repeat in 10 yrs    disectomy     ESOPHAGOGASTRODUODENOSCOPY     FINGER SURGERY     Right index-excision of mass    FOOT SURGERY Right    Bone Spurs   LAPAROSCOPIC HYSTERECTOMY  09/03/2012   Procedure: HYSTERECTOMY TOTAL LAPAROSCOPIC;  Surgeon: [name], MD;  Location: [facility];  Service: Gynecology;  Laterality: N/A;   LUMBAR DISC SURGERY     TUBAL LIGATION     UMBILICAL HERNIA REPAIR N/A 05/01/2018   Procedure: UMBILICAL HERNIA REPAIR;  Surgeon: Judeth Horn, MD;  Location: [facility];  Service: General;  Laterality: N/A;   Social History   Occupational History   Occupation: Optometrist  Tobacco Use   Smoking status: Former    Years: 33.00    Types: Cigarettes   Smokeless tobacco: Never   Tobacco comments:    quit March 2017  Vaping Use   Vaping Use: Never used  Substance and Sexual Activity   Alcohol use: Not Currently    Alcohol/week: 0.0 standard drinks of alcohol    Comment: occ   Drug use: Yes    Comment: chronic use of dilaudid   Sexual activity: Yes    Birth control/protection: Surgical

## 2022-02-28 ENCOUNTER — Ambulatory Visit: Payer: 59 | Admitting: Physical Medicine and Rehabilitation

## 2022-03-05 ENCOUNTER — Other Ambulatory Visit: Payer: Self-pay

## 2022-03-11 ENCOUNTER — Telehealth: Payer: Self-pay | Admitting: Family Medicine

## 2022-03-15 ENCOUNTER — Ambulatory Visit: Payer: 59

## 2022-03-15 ENCOUNTER — Telehealth: Payer: Self-pay

## 2022-03-15 NOTE — Telephone Encounter (Signed)
Unsuccessful attempt to reach patient on preferred number listed in notes for scheduled AWV. Left message on voicemail okay to reschedule. 

## 2022-04-01 ENCOUNTER — Ambulatory Visit: Payer: 59 | Admitting: Physical Medicine and Rehabilitation

## 2022-04-10 ENCOUNTER — Ambulatory Visit: Payer: 59 | Admitting: Orthopedic Surgery

## 2022-04-29 ENCOUNTER — Telehealth: Payer: Self-pay | Admitting: Family Medicine

## 2022-04-29 NOTE — Telephone Encounter (Signed)
Caller states she is calling from the office with a pt stating she is gaining weight on the fluid pills and she is sick and wanted to make an appt and so wanted her to get triage first.  04/29/2022 12:15:16 PM FINAL ATTEMPT MADE - no message left Susy Manor, RN, Megan  04/29/22 at 1701 - LVM instructions for pt to call office to schedule appt.

## 2022-04-29 NOTE — Telephone Encounter (Signed)
I spoke with patient to r/s her AWV.   She stated she needed to make an appointment with Dr Sarajane Jews.  She stated she needed to be in hospital that she was sick and gaining weight and she has been taking her fluid pill.  I transferred her to [phone]  but before I was able to give all information to triage patient either hung up or call dropped.  I spoke with Chole @ triage and she said she would give all the information to triage nurse and have them call patient.

## 2022-04-30 ENCOUNTER — Ambulatory Visit: Payer: 59

## 2022-05-01 ENCOUNTER — Ambulatory Visit (INDEPENDENT_AMBULATORY_CARE_PROVIDER_SITE_OTHER): Payer: 59 | Admitting: Family Medicine

## 2022-05-01 ENCOUNTER — Encounter: Payer: Self-pay | Admitting: Family Medicine

## 2022-05-01 VITALS — BP 138/92 | HR 98 | Temp 99.1°F | Wt 293.0 lb

## 2022-05-01 DIAGNOSIS — D649 Anemia, unspecified: Secondary | ICD-10-CM

## 2022-05-01 DIAGNOSIS — R233 Spontaneous ecchymoses: Secondary | ICD-10-CM | POA: Diagnosis not present

## 2022-05-01 DIAGNOSIS — M7989 Other specified soft tissue disorders: Secondary | ICD-10-CM | POA: Diagnosis not present

## 2022-05-01 LAB — HEPATIC FUNCTION PANEL
ALT: 29 U/L (ref 0–35)
AST: 46 U/L — ABNORMAL HIGH (ref 0–37)
Albumin: 4.2 g/dL (ref 3.5–5.2)
Alkaline Phosphatase: 96 U/L (ref 39–117)
Bilirubin, Direct: 0.2 mg/dL (ref 0.0–0.3)
Total Bilirubin: 0.7 mg/dL (ref 0.2–1.2)
Total Protein: 7.2 g/dL (ref 6.0–8.3)

## 2022-05-01 LAB — CBC WITH DIFFERENTIAL/PLATELET
Basophils Absolute: 0.1 10*3/uL (ref 0.0–0.1)
Basophils Relative: 0.6 % (ref 0.0–3.0)
Eosinophils Absolute: 0.1 10*3/uL (ref 0.0–0.7)
Eosinophils Relative: 1 % (ref 0.0–5.0)
HCT: 36.7 % (ref 36.0–46.0)
Hemoglobin: 11.8 g/dL — ABNORMAL LOW (ref 12.0–15.0)
Lymphocytes Relative: 15.1 % (ref 12.0–46.0)
Lymphs Abs: 1.6 10*3/uL (ref 0.7–4.0)
MCHC: 32.1 g/dL (ref 30.0–36.0)
MCV: 101.9 fl — ABNORMAL HIGH (ref 78.0–100.0)
Monocytes Absolute: 0.5 10*3/uL (ref 0.1–1.0)
Monocytes Relative: 5 % (ref 3.0–12.0)
Neutro Abs: 8.1 10*3/uL — ABNORMAL HIGH (ref 1.4–7.7)
Neutrophils Relative %: 78.3 % — ABNORMAL HIGH (ref 43.0–77.0)
Platelets: 334 10*3/uL (ref 150.0–400.0)
RBC: 3.6 Mil/uL — ABNORMAL LOW (ref 3.87–5.11)
RDW: 19.3 % — ABNORMAL HIGH (ref 11.5–15.5)
WBC: 10.3 10*3/uL (ref 4.0–10.5)

## 2022-05-01 LAB — BASIC METABOLIC PANEL
BUN: 7 mg/dL (ref 6–23)
CO2: 26 mEq/L (ref 19–32)
Calcium: 9.1 mg/dL (ref 8.4–10.5)
Chloride: 100 mEq/L (ref 96–112)
Creatinine, Ser: 0.72 mg/dL (ref 0.40–1.20)
GFR: 93.32 mL/min (ref >60.00)
Glucose, Bld: 102 mg/dL — ABNORMAL HIGH (ref 70–99)
Potassium: 3.9 mEq/L (ref 3.5–5.1)
Sodium: 140 mEq/L (ref 135–145)

## 2022-05-01 LAB — PROTIME-INR
INR: 1 ratio (ref 0.8–1.0)
Prothrombin Time: 11 s (ref 9.6–13.1)

## 2022-05-01 LAB — APTT: aPTT: 30.8 s (ref 25.4–36.8)

## 2022-05-01 LAB — TSH: TSH: 3.12 u[IU]/mL (ref 0.35–5.50)

## 2022-05-01 NOTE — Progress Notes (Signed)
   Subjective:    Patient ID: Dana [name], female    DOB: 31-Dec-1965, 56 y.o.   MRN: [id number]  HPI Here for a tender knot that came up yesterday on her left wrist. She has never had this before. No recent trauma that she can remember. She does admit to some easy bruising all over her body from very minor trauma. No nose bleeds or blood in the stools. She does not take any blood thinning medications including aspirin.    Review of Systems  Constitutional: Negative.   Respiratory: Negative.    Cardiovascular: Negative.   Musculoskeletal:  Positive for arthralgias.  Hematological:  Bruises/bleeds easily.       Objective:   Physical Exam Constitutional:      General: She is not in acute distress.    Appearance: She is obese.  Cardiovascular:     Rate and Rhythm: Normal rate and regular rhythm.     Pulses: Normal pulses.     Heart sounds: Normal heart sounds.  Pulmonary:     Effort: Pulmonary effort is normal.     Breath sounds: Normal breath sounds.  Skin:    Comments: There is widespread faint ecchymosis on the left arm from the mid upper arm down to the hand. There is also a firm mobile very tender knot on the dorsal left wrist. She  is tender along the entire left arm   Neurological:     Mental Status: She is alert.           Assessment & Plan:  She has what appears to be a hematoma on the left wrist, so we will send her for a venous doppler. She also has some easy bruising, and we will investigate with labs including CBC, PT, and PTT.  [name], MD

## 2022-05-02 ENCOUNTER — Telehealth: Payer: Self-pay | Admitting: Family Medicine

## 2022-05-02 NOTE — Telephone Encounter (Addendum)
Message complete see lab results from 05/01/22.

## 2022-05-02 NOTE — Addendum Note (Signed)
Addended by: [name] A on: 05/02/2022 12:44 PM   Modules accepted: Orders

## 2022-05-02 NOTE — Telephone Encounter (Signed)
Pt is calling and would like blood work results from yesterday

## 2022-05-03 ENCOUNTER — Other Ambulatory Visit (INDEPENDENT_AMBULATORY_CARE_PROVIDER_SITE_OTHER): Payer: 59

## 2022-05-03 ENCOUNTER — Ambulatory Visit (HOSPITAL_COMMUNITY)
Admission: RE | Admit: 2022-05-03 | Discharge: 2022-05-03 | Disposition: A | Discharge status: Home / Self Care | Payer: 59 | Source: Ambulatory Visit | Attending: Cardiovascular Disease | Admitting: Cardiovascular Disease

## 2022-05-03 ENCOUNTER — Other Ambulatory Visit: Payer: 59

## 2022-05-03 DIAGNOSIS — R233 Spontaneous ecchymoses: Secondary | ICD-10-CM | POA: Diagnosis not present

## 2022-05-03 DIAGNOSIS — M7989 Other specified soft tissue disorders: Secondary | ICD-10-CM | POA: Diagnosis present

## 2022-05-03 DIAGNOSIS — D649 Anemia, unspecified: Secondary | ICD-10-CM | POA: Diagnosis not present

## 2022-05-03 LAB — URINALYSIS, ROUTINE W REFLEX MICROSCOPIC
Hgb urine dipstick: NEGATIVE
Ketones, ur: 40 — AB
Nitrite: NEGATIVE
Specific Gravity, Urine: 1.025 (ref 1.000–1.030)
Total Protein, Urine: 30 — AB
Urine Glucose: NEGATIVE
Urobilinogen, UA: 1 (ref 0.0–1.0)
pH: 6 (ref 5.0–8.0)

## 2022-05-03 LAB — IBC + FERRITIN
Ferritin: 597.1 ng/mL — ABNORMAL HIGH (ref 10.0–291.0)
Iron: 105 ug/dL (ref 42–145)
Saturation Ratios: 28.8 % (ref 20.0–50.0)
TIBC: 364 ug/dL (ref 250.0–450.0)
Transferrin: 260 mg/dL (ref 212.0–360.0)

## 2022-05-03 LAB — FOLATE: Folate: 2.1 ng/mL — ABNORMAL LOW (ref >5.9)

## 2022-05-03 LAB — VITAMIN B12: Vitamin B-12: 150 pg/mL — ABNORMAL LOW (ref 211–911)

## 2022-05-06 ENCOUNTER — Ambulatory Visit (INDEPENDENT_AMBULATORY_CARE_PROVIDER_SITE_OTHER): Payer: 59

## 2022-05-06 VITALS — Ht 64.0 in | Wt 293.0 lb

## 2022-05-06 DIAGNOSIS — Z0000 Encounter for general adult medical examination without abnormal findings: Secondary | ICD-10-CM | POA: Diagnosis not present

## 2022-05-06 NOTE — Progress Notes (Signed)
I connected with Dana [name] today by telephone and verified that I am speaking with the correct person using two identifiers. Location patient: home Location provider: work Persons participating in the virtual visit: Dana [name], [name].   I discussed the limitations, risks, security and privacy concerns of performing an evaluation and management service by telephone and the availability of in person appointments. I also discussed with the patient that there may be a patient responsible charge related to this service. The patient expressed understanding and verbally consented to this telephonic visit.    Interactive audio and video telecommunications were attempted between this provider and patient, however failed, due to patient having technical difficulties OR patient did not have access to video capability.  We continued and completed visit with audio only.     Vital signs may be patient reported or missing.  Subjective:   Dana [name] is a 56 y.o. female who presents for an Initial Medicare Annual Wellness Visit.  Review of Systems     Cardiac Risk Factors include: hypertension;obesity (BMI >30kg/m2)     Objective:    Today's Vitals   05/06/22 1441  Weight: 293 lb (132.9 kg)  Height: '5\' 4"'$  (1.626 m)   Body mass index is 50.29 kg/m.     05/06/2022    2:51 PM 05/11/2019    6:29 AM 05/07/2019   12:06 PM 05/03/2019    9:55 AM 02/14/2019    1:17 PM 04/23/2018    1:35 PM 12/12/2015   11:27 AM  Advanced Directives  Does Patient Have a Medical Advance Directive? No No No No No No No  Would patient like information on creating a medical advance directive?  No - Patient declined No - Patient declined No - Patient declined No - Patient declined No - Patient declined No - patient declined information    Current Medications (verified) Outpatient Encounter Medications as of 05/06/2022  Medication Sig   albuterol (VENTOLIN HFA) 108 (90 Base) MCG/ACT inhaler Inhale 2  puffs into the lungs every 6 (six) hours as needed for wheezing or shortness of breath.   ALPRAZolam (XANAX) 0.5 MG tablet Take 1 tablet (0.5 mg total) by mouth 3 (three) times daily as needed for anxiety.   cyclobenzaprine (FLEXERIL) 10 MG tablet TAKE 1 TABLET BY MOUTH EVERYDAY AT BEDTIME   fluticasone (FLONASE) 50 MCG/ACT nasal spray SPRAY 2 SPRAYS INTO EACH NOSTRIL EVERY DAY   furosemide (LASIX) 40 MG tablet TAKE 2 TABLETS IN THE MORNING AND ONE IN THE EVENING (Patient taking differently: Take 40 mg by mouth in the morning and at bedtime.)   HYDROmorphone (DILAUDID) 4 MG tablet Take 4 mg by mouth 2 (two) times daily. In the morning and at bedtime   latanoprost (XALATAN) 0.005 % ophthalmic solution 1 drop at bedtime.   magic mouthwash (nystatin, hydrocortisone, diphenhydrAMINE) suspension Swish and swallow 5 mLs as directed.   meloxicam (MOBIC) 15 MG tablet as needed.   metoprolol tartrate (LOPRESSOR) 50 MG tablet Take 1 tablet (50 mg total) by mouth 2 (two) times daily.   ondansetron (ZOFRAN) 4 MG tablet Take 1 tablet (4 mg total) by mouth every 8 (eight) hours as needed for nausea or vomiting.   pantoprazole (PROTONIX) 40 MG tablet Take 40 mg by mouth daily.   potassium chloride (KLOR-CON 10) 10 MEQ tablet Take 1 tablet (10 mEq total) by mouth 2 (two) times daily. (Patient taking differently: Take 10 mEq by mouth daily.)   sucralfate (CARAFATE) 1 GM/10ML suspension Take  10 mLs (1 g total) by mouth 4 (four) times daily -  with meals and at bedtime.   temazepam (RESTORIL) 30 MG capsule TAKE 1 CAPSULE (30 MG TOTAL) BY MOUTH AT BEDTIME AS NEEDED FOR SLEEP.   benzonatate (TESSALON PERLES) 100 MG capsule 1-2 capsules up to twice daily as needed for cough (Patient not taking: Reported on 05/06/2022)   No facility-administered encounter medications on file as of 05/06/2022.    Allergies (verified) Lisinopril, Codeine, Hydrocodone, Penicillins, Amlodipine, and Acetaminophen   History: Past Medical  History:  Diagnosis Date   Anxiety    Back pain with radiation    Breast mass, right    Chronic pain    Chronic, continuous use of opioids    Complication of anesthesia 04/07/14   Allergic reaction to Lisinopril immediately following surgery   DJD (degenerative joint disease)    Fibromyalgia    Groin abscess    Headache(784.0)    History of IBS    Hypercholesterolemia    IBS (irritable bowel syndrome)    Lactose intolerance    Mild hypertension    Obesity    Tobacco use disorder    Umbilical hernia    Symptomatic   Past Surgical History:  Procedure Laterality Date   ABDOMINAL HYSTERECTOMY     ANTERIOR CERVICAL DECOMP/DISCECTOMY FUSION N/A 12/20/2015   Procedure: ANTERIOR CERVICAL DECOMPRESSION FUSION CERVICAL 4-5, CERVICAL 5-6, CERVICAL 6-7 WITH INSTRUMENTATION AND ALLOGRAFT;  Surgeon: Phylliss Bob, MD;  Location: [facility];  Service: Orthopedics;  Laterality: N/A;  Anterior cervical decompression fusion, cervical 4-5, cervical 5-6, cervical 6-7 with instrumentation and allograft   BACK SURGERY     BILATERAL SALPINGECTOMY  09/03/2012   Procedure: BILATERAL SALPINGECTOMY;  Surgeon: [name], MD;  Location: [facility];  Service: Gynecology;  Laterality: Bilateral;   BREAST BIOPSY Right 04/07/2014   Procedure: REMOVAL RIGHT BREAST MASS WITH WIRE LOCALIZATION;  Surgeon: [name], MD;  Location: [facility];  Service: General;  Laterality: Right;   BREAST EXCISIONAL BIOPSY Left    x2   BREAST LUMPECTOMY     x2   CARPAL TUNNEL RELEASE Left 05/11/2019   Procedure: LEFT CARPAL TUNNEL RELEASE, RIGHT TENNIS ELBOW MARCAINE/DEPO MEDROL INJECTION UNDER ANESTHESIA;  Surgeon: Jessy Oto, MD;  Location: [facility];  Service: Orthopedics;  Laterality: Left;   COLONOSCOPY W/ BIOPSIES  04/24/2012   per Dr. Carlean Purl, clear, repeat in 10 yrs    disectomy     ESOPHAGOGASTRODUODENOSCOPY     FINGER SURGERY     Right index-excision of mass    FOOT SURGERY Right    Bone Spurs   LAPAROSCOPIC  HYSTERECTOMY  09/03/2012   Procedure: HYSTERECTOMY TOTAL LAPAROSCOPIC;  Surgeon: [name], MD;  Location: [facility];  Service: Gynecology;  Laterality: N/A;   LUMBAR DISC SURGERY     TUBAL LIGATION     UMBILICAL HERNIA REPAIR N/A 05/01/2018   Procedure: UMBILICAL HERNIA REPAIR;  Surgeon: Judeth Horn, MD;  Location: [facility];  Service: General;  Laterality: N/A;   Family History  Problem Relation Age of Onset   Diabetes Father    Diabetes Mother    Prostate cancer Paternal Grandfather    Breast cancer Maternal Aunt 77   Social History   Socioeconomic History   Marital status: Married    Spouse name: Veronda Gabor   Number of children: 4   Years of education: Not on file   Highest education level: Not on file  Occupational History  Occupation: Optometrist  Tobacco Use   Smoking status: Former    Years: 33.00    Types: Cigarettes   Smokeless tobacco: Never   Tobacco comments:    quit March 2017  Vaping Use   Vaping Use: Never used  Substance and Sexual Activity   Alcohol use: Not Currently    Alcohol/week: 0.0 standard drinks of alcohol    Comment: occ   Drug use: Yes    Comment: chronic use of dilaudid   Sexual activity: Yes    Birth control/protection: Surgical  Other Topics Concern   Not on file  Social History Narrative   Married - lives with husband 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-law   Works in [employer]   4 grown children - 1985, [place] (twins)   Grandchildren - 4   Updated 10/12/2013         Social Determinants of Health   Financial Resource Strain: Low Risk  (05/06/2022)   Overall Financial Resource Strain (CARDIA)    Difficulty of Paying Living Expenses: Not hard at all  Food Insecurity: No Food Insecurity (05/06/2022)   Hunger Vital Sign    Worried About Running Out of Food in the Last Year: Never true    [place] in the Last Year: Never true  Transportation Needs: No Transportation Needs (05/06/2022)   PRAPARE - Radiographer, therapeutic (Medical): No    Lack of Transportation (Non-Medical): No  Physical Activity: Inactive (05/06/2022)   Exercise Vital Sign    Days of Exercise per Week: 0 days    Minutes of Exercise per Session: 0 min  Stress: No Stress Concern Present (05/06/2022)   [place]    Feeling of Stress : Not at all  Social Connections: Not on file    Tobacco Counseling Counseling given: Not Answered Tobacco comments: quit March 2017   Clinical Intake:  Pre-visit preparation completed: Yes  Pain : No/denies pain     Nutritional Status: BMI > 30  Obese Nutritional Risks: Nausea/ vomitting/ diarrhea (nauseous) Diabetes: No  How often do you need to have someone help you when you read instructions, pamphlets, or other written materials from your doctor or pharmacy?: 1 - Never What is the last grade level you completed in school?: 12th grade  Diabetic? no  Interpreter Needed?: No  Information entered by :: NAllen LPN   Activities of Daily Living    05/06/2022    2:52 PM  In your present state of health, do you have any difficulty performing the following activities:  Hearing? 0  Vision? 1  Comment glaucoma  Difficulty concentrating or making decisions? 0  Walking or climbing stairs? 1  Dressing or bathing? 0  Doing errands, shopping? 0  Preparing Food and eating ? N  Using the Toilet? N  In the past six months, have you accidently leaked urine? Y  Do you have problems with loss of bowel control? N  Managing your Medications? N  Managing your Finances? N  Housekeeping or managing your Housekeeping? N    Patient Care Team: [name], MD as PCP - General (Family Medicine)  Indicate any recent Medical Services you may have received from other than [facility] providers in the past year (date may be approximate).     Assessment:   This is a routine wellness examination for Dana [name].  Hearing/Vision  screen Vision Screening - Comments:: Regular eye exams, [facility]   Dietary issues  and exercise activities discussed: Current Exercise Habits: The patient does not participate in regular exercise at present   Goals Addressed             This Visit's Progress    Patient Stated       05/06/2022, no goals       Depression Screen    05/06/2022    2:52 PM 05/01/2022    3:51 PM 12/31/2021    1:00 PM 09/14/2021    8:59 AM 12/03/2019    1:04 PM 06/14/2015    3:50 PM 06/07/2015    3:19 PM  PHQ 2/9 Scores  PHQ - 2 Score 0 3 0 0 0 0 0  PHQ- 9 Score  8 0 0       Fall Risk    05/06/2022    2:52 PM 05/01/2022    3:51 PM 12/31/2021    1:00 PM 09/14/2021    9:00 AM  [place] in the past year? 0 0 0 0  Number falls in past yr: 0 0 0 0  Injury with Fall? 0 0 0 0  Risk for fall due to : Medication side effect No Fall Risks No Fall Risks No Fall Risks  Follow up Falls evaluation completed;Education provided;Falls prevention discussed Falls evaluation completed Falls evaluation completed     FALL RISK PREVENTION PERTAINING TO THE HOME:  Any stairs in or around the home? No  If so, are there any without handrails? N/a Home free of loose throw rugs in walkways, pet beds, electrical cords, etc? Yes  Adequate lighting in your home to reduce risk of falls? Yes   ASSISTIVE DEVICES UTILIZED TO PREVENT FALLS:  Life alert? No  Use of a cane, walker or w/c? Yes  Grab bars in the bathroom? Yes  Shower chair or bench in shower? No  Elevated toilet seat or a handicapped toilet? No   TIMED UP AND GO:  Was the test performed? No .      Cognitive Function:        05/06/2022    2:53 PM  6CIT Screen  What Year? 0 points  What month? 0 points  What time? 0 points  Count back from 20 2 points  Months in reverse 0 points  Repeat phrase 2 points  Total Score 4 points    Immunizations Immunization History  Administered Date(s) Administered   Influenza Split 07/16/2012,  06/12/2013   PFIZER(Purple Top)SARS-COV-2 Vaccination 12/08/2019, 12/29/2019, 09/26/2020   Pneumococcal Polysaccharide-23 09/04/2012    TDAP status: Due, Education has been provided regarding the importance of this vaccine. Advised may receive this vaccine at local pharmacy or Health Dept. Aware to provide a copy of the vaccination record if obtained from local pharmacy or Health Dept. Verbalized acceptance and understanding.  Flu Vaccine status: Declined, Education has been provided regarding the importance of this vaccine but patient still declined. Advised may receive this vaccine at local pharmacy or Health Dept. Aware to provide a copy of the vaccination record if obtained from local pharmacy or Health Dept. Verbalized acceptance and understanding.  Pneumococcal vaccine status: Up to date  Covid-19 vaccine status: Completed vaccines  Qualifies for Shingles Vaccine? Yes   Zostavax completed No   Shingrix Completed?: No.    Education has been provided regarding the importance of this vaccine. Patient has been advised to call insurance company to determine out of pocket expense if they have not yet received this vaccine. Advised may also receive vaccine  at local pharmacy or Health Dept. Verbalized acceptance and understanding.  Screening Tests Health Maintenance  Topic Date Due   HIV Screening  Never done   Hepatitis C Screening  Never done   TETANUS/TDAP  Never done   Zoster Vaccines- Shingrix (1 of 2) Never done   MAMMOGRAM  06/23/2020   COVID-19 Vaccine (4 - Pfizer risk series) 11/21/2020   COLONOSCOPY (Pts 45-76yr Insurance coverage will need to be confirmed)  04/24/2022   INFLUENZA VACCINE  04/16/2022   PAP SMEAR-Modifier  11/28/2023   HPV VACCINES  Aged Out    Health Maintenance  Health Maintenance Due  Topic Date Due   HIV Screening  Never done   Hepatitis C Screening  Never done   TETANUS/TDAP  Never done   Zoster Vaccines- Shingrix (1 of 2) Never done   MAMMOGRAM   06/23/2020   COVID-19 Vaccine (4 - Pfizer risk series) 11/21/2020   COLONOSCOPY (Pts 45-481yrInsurance coverage will need to be confirmed)  04/24/2022   INFLUENZA VACCINE  04/16/2022    Colorectal cancer screening: Type of screening: Colonoscopy. Completed 04/24/2012. Repeat every 10 years  Mammogram status: patient to schedule  Bone Density status: n/a  Lung Cancer Screening: (Low Dose CT Chest recommended if Age 57-80ears, 30 pack-year currently smoking OR have quit w/in 15years.) does not qualify.   Lung Cancer Screening Referral: no  Additional Screening:  Hepatitis C Screening: does qualify;   Vision Screening: Recommended annual ophthalmology exams for early detection of glaucoma and other disorders of the eye. Is the patient up to date with their annual eye exam?  Yes  Who is the provider or what is the name of the office in which the patient attends annual eye exams? [facility], would they like to be referred to a provider to establish care? No .   Dental Screening: Recommended annual dental exams for proper oral hygiene  Community Resource Referral / Chronic Care Management: CRR required this visit?  No   CCM required this visit?  No      Plan:     I have personally reviewed and noted the following in the patient's chart:   Medical and social history Use of alcohol, tobacco or illicit drugs  Current medications and supplements including opioid prescriptions. Patient is currently taking opioid prescriptions. Information provided to patient regarding non-opioid alternatives. Patient advised to discuss non-opioid treatment plan with their provider. Functional ability and status Nutritional status Physical activity Advanced directives List of other physicians Hospitalizations, surgeries, and ER visits in previous 12 months Vitals Screenings to include cognitive, depression, and falls Referrals and  appointments  In addition, I have reviewed and discussed with patient certain preventive protocols, quality metrics, and best practice recommendations. A written personalized care plan for preventive services as well as general preventive health recommendations were provided to patient.     NiKellie SimmeringLPN   04/17/08/4709 Nurse Notes: none  Due to this being a virtual visit, the after visit summary with patients personalized plan was offered to patient via mail or my-chart. Patient would like to access on my-chart

## 2022-05-06 NOTE — Patient Instructions (Signed)
Dana [name] , Thank you for taking time to come for your Medicare Wellness Visit. I appreciate your ongoing commitment to your health goals. Please review the following plan we discussed and let me know if I can assist you in the future.   Screening recommendations/referrals: Colonoscopy: completed 04/24/2012, due now Mammogram: patient to schedule Bone Density: n/a Recommended yearly ophthalmology/optometry visit for glaucoma screening and checkup Recommended yearly dental visit for hygiene and checkup  Vaccinations: Influenza vaccine: decline Pneumococcal vaccine: n/a Tdap vaccine: due Shingles vaccine: discussed  Covid-19:  12/08/2019, 12/29/2019, 09/26/2020  Advanced directives: Advance directive discussed with you today.   Conditions/risks identified: none  Next appointment: Follow up in one year for your annual wellness visit.   Preventive Care 40-64 Years, Female Preventive care refers to lifestyle choices and visits with your health care provider that can promote health and wellness. What does preventive care include? A yearly physical exam. This is also called an annual well check. Dental exams once or twice a year. Routine eye exams. Ask your health care provider how often you should have your eyes checked. Personal lifestyle choices, including: Daily care of your teeth and gums. Regular physical activity. Eating a healthy diet. Avoiding tobacco and drug use. Limiting alcohol use. Practicing safe sex. Taking low-dose aspirin daily starting at age 75. Taking vitamin and mineral supplements as recommended by your health care provider. What happens during an annual well check? The services and screenings done by your health care provider during your annual well check will depend on your age, overall health, lifestyle risk factors, and family history of disease. Counseling  Your health care provider may ask you questions about your: Alcohol use. Tobacco use. Drug  use. Emotional well-being. Home and relationship well-being. Sexual activity. Eating habits. Work and work Statistician. Method of birth control. Menstrual cycle. Pregnancy history. Screening  You may have the following tests or measurements: Height, weight, and BMI. Blood pressure. Lipid and cholesterol levels. These may be checked every 5 years, or more frequently if you are over 42 years old. Skin check. Lung cancer screening. You may have this screening every year starting at age 69 if you have a 30-pack-year history of smoking and currently smoke or have quit within the past 15 years. Fecal occult blood test (FOBT) of the stool. You may have this test every year starting at age 24. Flexible sigmoidoscopy or colonoscopy. You may have a sigmoidoscopy every 5 years or a colonoscopy every 10 years starting at age 64. Hepatitis C blood test. Hepatitis B blood test. Sexually transmitted disease (STD) testing. Diabetes screening. This is done by checking your blood sugar (glucose) after you have not eaten for a while (fasting). You may have this done every 1-3 years. Mammogram. This may be done every 1-2 years. Talk to your health care provider about when you should start having regular mammograms. This may depend on whether you have a family history of breast cancer. BRCA-related cancer screening. This may be done if you have a family history of breast, ovarian, tubal, or peritoneal cancers. Pelvic exam and Pap test. This may be done every 3 years starting at age 84. Starting at age 43, this may be done every 5 years if you have a Pap test in combination with an HPV test. Bone density scan. This is done to screen for osteoporosis. You may have this scan if you are at high risk for osteoporosis. Discuss your test results, treatment options, and if necessary, the need for more  tests with your health care provider. Vaccines  Your health care provider may recommend certain vaccines, such  as: Influenza vaccine. This is recommended every year. Tetanus, diphtheria, and acellular pertussis (Tdap, Td) vaccine. You may need a Td booster every 10 years. Zoster vaccine. You may need this after age 52. Pneumococcal 13-valent conjugate (PCV13) vaccine. You may need this if you have certain conditions and were not previously vaccinated. Pneumococcal polysaccharide (PPSV23) vaccine. You may need one or two doses if you smoke cigarettes or if you have certain conditions. Talk to your health care provider about which screenings and vaccines you need and how often you need them. This information is not intended to replace advice given to you by your health care provider. Make sure you discuss any questions you have with your health care provider. Document Released: 09/29/2015 Document Revised: 05/22/2016 Document Reviewed: 07/04/2015 Elsevier Interactive Patient Education  2017 [employer] Prevention in the Home Falls can cause injuries. They can happen to people of all ages. There are many things you can do to make your home safe and to help prevent falls. What can I do on the outside of my home? Regularly fix the edges of walkways and driveways and fix any cracks. Remove anything that might make you trip as you walk through a door, such as a raised step or threshold. Trim any bushes or trees on the path to your home. Use bright outdoor lighting. Clear any walking paths of anything that might make someone trip, such as rocks or tools. Regularly check to see if handrails are loose or broken. Make sure that both sides of any steps have handrails. Any raised decks and porches should have guardrails on the edges. Have any leaves, snow, or ice cleared regularly. Use sand or salt on walking paths during winter. Clean up any spills in your garage right away. This includes oil or grease spills. What can I do in the bathroom? Use night lights. Install grab bars by the toilet and in  the tub and shower. Do not use towel bars as grab bars. Use non-skid mats or decals in the tub or shower. If you need to sit down in the shower, use a plastic, non-slip stool. Keep the floor dry. Clean up any water that spills on the floor as soon as it happens. Remove soap buildup in the tub or shower regularly. Attach bath mats securely with double-sided non-slip rug tape. Do not have throw rugs and other things on the floor that can make you trip. What can I do in the bedroom? Use night lights. Make sure that you have a light by your bed that is easy to reach. Do not use any sheets or blankets that are too big for your bed. They should not hang down onto the floor. Have a firm chair that has side arms. You can use this for support while you get dressed. Do not have throw rugs and other things on the floor that can make you trip. What can I do in the kitchen? Clean up any spills right away. Avoid walking on wet floors. Keep items that you use a lot in easy-to-reach places. If you need to reach something above you, use a strong step stool that has a grab bar. Keep electrical cords out of the way. Do not use floor polish or wax that makes floors slippery. If you must use wax, use non-skid floor wax. Do not have throw rugs and other things on  the floor that can make you trip. What can I do with my stairs? Do not leave any items on the stairs. Make sure that there are handrails on both sides of the stairs and use them. Fix handrails that are broken or loose. Make sure that handrails are as long as the stairways. Check any carpeting to make sure that it is firmly attached to the stairs. Fix any carpet that is loose or worn. Avoid having throw rugs at the top or bottom of the stairs. If you do have throw rugs, attach them to the floor with carpet tape. Make sure that you have a light switch at the top of the stairs and the bottom of the stairs. If you do not have them, ask someone to add them  for you. What else can I do to help prevent falls? Wear shoes that: Do not have high heels. Have rubber bottoms. Are comfortable and fit you well. Are closed at the toe. Do not wear sandals. If you use a stepladder: Make sure that it is fully opened. Do not climb a closed stepladder. Make sure that both sides of the stepladder are locked into place. Ask someone to hold it for you, if possible. Clearly mark and make sure that you can see: Any grab bars or handrails. First and last steps. Where the edge of each step is. Use tools that help you move around (mobility aids) if they are needed. These include: Canes. Walkers. Scooters. Crutches. Turn on the lights when you go into a dark area. Replace any light bulbs as soon as they burn out. Set up your furniture so you have a clear path. Avoid moving your furniture around. If any of your floors are uneven, fix them. If there are any pets around you, be aware of where they are. Review your medicines with your doctor. Some medicines can make you feel dizzy. This can increase your chance of falling. Ask your doctor what other things that you can do to help prevent falls. This information is not intended to replace advice given to you by your health care provider. Make sure you discuss any questions you have with your health care provider. Document Released: 06/29/2009 Document Revised: 02/08/2016 Document Reviewed: 10/07/2014 Elsevier Interactive Patient Education  2017 [employer].

## 2022-05-07 ENCOUNTER — Other Ambulatory Visit: Payer: Self-pay

## 2022-05-07 ENCOUNTER — Telehealth: Payer: Self-pay | Admitting: Family Medicine

## 2022-05-07 DIAGNOSIS — R2232 Localized swelling, mass and lump, left upper limb: Secondary | ICD-10-CM

## 2022-05-07 DIAGNOSIS — E538 Deficiency of other specified B group vitamins: Secondary | ICD-10-CM

## 2022-05-07 MED ORDER — FOLIC ACID 1 MG PO TABS: 1.0000 mg | ORAL_TABLET | Freq: Every day | ORAL | 3 refills | Status: DC

## 2022-05-07 NOTE — Telephone Encounter (Signed)
Pt call and stated she want a call back about her result of her [place] and want a call back.

## 2022-05-07 NOTE — Telephone Encounter (Signed)
Pt called in again requesting the results from her imaging. Pt is very concerned about the area on her arm

## 2022-05-07 NOTE — Telephone Encounter (Signed)
We discussed this at the [facility]. I think this is a hematoma, a collection of clotted blood under the skin. This should slowly dissolve and disappear over the next month or two. Just to make sure, I will set up an MRI of the left wrist sometime soon. I see Dr. Marlou Sa has already ordered an MRI of the other wrist, so she could possibly get them done together

## 2022-05-07 NOTE — Telephone Encounter (Signed)
Previously spoke with patient earlier about [place] results and lab results.     Message sent to Dr. Sarajane Jews from doppler study concerning nodule on right arm.

## 2022-05-08 ENCOUNTER — Telehealth: Payer: Self-pay | Admitting: Family Medicine

## 2022-05-08 NOTE — Telephone Encounter (Signed)
Last refill sent by historical provider

## 2022-05-08 NOTE — Telephone Encounter (Signed)
Pt called to say the pharmacy told her this prescription has expired:  HYDROmorphone (DILAUDID) 4 MG tablet  Pt is requesting that MD renew this prescription.  Also, Pt stated she did not receive an after visit summary to tell her how to treat the knot on her arm after the office visit on 05/01/22.  Please advise.   [id number] [place] [place]  [phone] Fax:  [phone]

## 2022-05-08 NOTE — Telephone Encounter (Signed)
She needs another PMV before I can renew the Dilaudid. For the knot on her arm she should apply warm compresses

## 2022-05-09 NOTE — Telephone Encounter (Signed)
Spoke with pt advised of Dr Sarajane Jews recommendations, verbalized understanding

## 2022-05-09 NOTE — Telephone Encounter (Signed)
Pt scheduled for 05/10/22 (VV // PMV)

## 2022-05-10 ENCOUNTER — Telehealth: Payer: Self-pay | Admitting: Family Medicine

## 2022-05-10 ENCOUNTER — Telehealth (INDEPENDENT_AMBULATORY_CARE_PROVIDER_SITE_OTHER): Payer: 59 | Admitting: Family Medicine

## 2022-05-10 ENCOUNTER — Encounter: Payer: Self-pay | Admitting: Family Medicine

## 2022-05-10 DIAGNOSIS — M5440 Lumbago with sciatica, unspecified side: Secondary | ICD-10-CM

## 2022-05-10 DIAGNOSIS — F1190 Opioid use, unspecified, uncomplicated: Secondary | ICD-10-CM | POA: Diagnosis not present

## 2022-05-10 DIAGNOSIS — G8929 Other chronic pain: Secondary | ICD-10-CM

## 2022-05-10 MED ORDER — HYDROMORPHONE HCL 4 MG PO TABS: 4.0000 mg | ORAL_TABLET | Freq: Four times a day (QID) | ORAL | 0 refills | Status: DC | PRN

## 2022-05-10 MED ORDER — HYDROMORPHONE HCL 4 MG PO TABS: 4.0000 mg | ORAL_TABLET | Freq: Four times a day (QID) | ORAL | 0 refills | Status: AC | PRN

## 2022-05-10 NOTE — Telephone Encounter (Signed)
Pt requests a different referral to an open MRI center because she is extremely clausrophobic  (referral [id number])

## 2022-05-10 NOTE — Progress Notes (Signed)
   Subjective:    Patient ID: Dana [name], female    DOB: [birth year], 56 y.o.   MRN: [id number]  HPI Virtual Visit via Telephone Note  I connected with the patient on 05/10/22 at  9:45 AM EDT by telephone and verified that I am speaking with the correct person using two identifiers.   I discussed the limitations, risks, security and privacy concerns of performing an evaluation and management service by telephone and the availability of in person appointments. I also discussed with the patient that there may be a patient responsible charge related to this service. The patient expressed understanding and agreed to proceed.  Location patient: home Location provider: work or home office Participants present for the call: patient, provider Patient did not have a visit in the prior 7 days to address this/these issue(s).   History of Present Illness: Here for pain management. She is doing well.    Observations/Objective: Patient sounds cheerful and well on the phone. I do not appreciate any SOB. Speech and thought processing are grossly intact. Patient reported vitals:  Assessment and Plan: Pain management. Indication for chronic opioid: low back pain Medication and dose: Dilaudid 4 mg # pills per month: 120 Last UDS date: 09-19-21 Opioid Treatment Agreement signed (Y/N): 12-19-17 Opioid Treatment Agreement last reviewed with patient:  05-10-22 Abanda reviewed this encounter (include red flags): Yes Meds were refilled.  [name], MD  Past Medical History Pertinent Negatives:  Diagnosis Date Noted   Family history of anesthesia complication 78/58/8502   Sleep apnea 04/23/2018     Follow Up Instructions:     99441 5-10 99442 11-20 9443 21-30 I did not refer this patient for an OV in the next 24 hours for this/these issue(s).  I discussed the assessment and treatment plan with the patient. The patient was provided an opportunity to ask questions and all were answered.  The patient agreed with the plan and demonstrated an understanding of the instructions.   The patient was advised to call back or seek an in-person evaluation if the symptoms worsen or if the condition fails to improve as anticipated.  I provided 14 minutes of non-face-to-face time during this encounter.   [name], MD     Review of Systems     Objective:   Physical Exam        Assessment & Plan:

## 2022-05-15 ENCOUNTER — Ambulatory Visit (INDEPENDENT_AMBULATORY_CARE_PROVIDER_SITE_OTHER): Payer: 59

## 2022-05-15 DIAGNOSIS — E538 Deficiency of other specified B group vitamins: Secondary | ICD-10-CM

## 2022-05-15 MED ORDER — CYANOCOBALAMIN 1000 MCG/ML IJ SOLN
1000.0000 ug | Freq: Once | INTRAMUSCULAR | Status: AC
  Administered 2022-05-15: 1000 ug via INTRAMUSCULAR

## 2022-05-15 NOTE — Addendum Note (Signed)
Addended by: Octavio Manns E on: 05/15/2022 01:00 PM   Modules accepted: Orders

## 2022-05-15 NOTE — Progress Notes (Signed)
Per orders of Dr. Burchette, injection of Cyanocobalamin 1000 mcg given by Elma Shands L Ashtan Laton. Patient tolerated injection well.  

## 2022-05-17 ENCOUNTER — Telehealth: Payer: Self-pay

## 2022-05-17 NOTE — Telephone Encounter (Signed)
caller states she got a B12 shot and started itching yesterday evening. itching everywhere this morning  05/16/2022 12:11:19 PM Call PCP within 24 Hours Bringas, RN, Danica  Comments User: Harriette Bouillon, RN Date/Time Eilene Ghazi Time): 05/16/2022 12:11:54 PM caller states she just wants to take beandryl and if it doesnt help will call back

## 2022-05-22 ENCOUNTER — Ambulatory Visit: Payer: 59

## 2022-05-23 ENCOUNTER — Ambulatory Visit: Payer: 59

## 2022-05-24 ENCOUNTER — Telehealth: Payer: Self-pay

## 2022-05-24 NOTE — Telephone Encounter (Signed)
-----   Message from [name], MD sent at 05/07/2022  7:53 AM EDT ----- Regarding: FW: labs and ultra sound Please contact [facility] about the lab results and the venous doppler results  ----- Message ----- From: Kellie Simmering, LPN Sent: 9/93/7169   3:08 PM EDT To: [name], MD Subject: labs and ultra sound                           Good afternoon Dr. Sarajane Jews, Just completed my AWV with Mrs. Greaves. She states that she is upset that she has not heard anything about her labs or ultra sound. She said that she was told that she was anemic and says that she thinks she should be taking iron, but no one has said anything about it. She would like for you to reach out to her with the results. Thank you

## 2022-05-24 NOTE — Telephone Encounter (Signed)
Spoke with pt reviewed results verbalized understanding

## 2022-05-24 NOTE — Telephone Encounter (Signed)
-----   Message from [name], MD sent at 05/07/2022  7:53 AM EDT ----- Regarding: FW: labs and ultra sound Please contact [facility] about the lab results and the venous doppler results  ----- Message ----- From: Kellie Simmering, LPN Sent: 0/35/[id number]   3:08 PM EDT To: [name], MD Subject: labs and ultra sound                           Good afternoon Dr. Sarajane Jews, Just completed my AWV with Dana [name]. She states that she is upset that she has not heard anything about her labs or ultra sound. She said that she was told that she was anemic and says that she thinks she should be taking iron, but no one has said anything about it. She would like for you to reach out to her with the results. Thank you

## 2022-05-28 ENCOUNTER — Other Ambulatory Visit: Payer: 59

## 2022-05-30 ENCOUNTER — Telehealth: Payer: Self-pay | Admitting: Physical Medicine and Rehabilitation

## 2022-05-30 ENCOUNTER — Ambulatory Visit (INDEPENDENT_AMBULATORY_CARE_PROVIDER_SITE_OTHER): Payer: 59

## 2022-05-30 ENCOUNTER — Telehealth: Payer: Self-pay

## 2022-05-30 DIAGNOSIS — E538 Deficiency of other specified B group vitamins: Secondary | ICD-10-CM

## 2022-05-30 MED ORDER — CYANOCOBALAMIN 1000 MCG/ML IJ SOLN
1000.0000 ug | Freq: Once | INTRAMUSCULAR | Status: AC
  Administered 2022-05-30: 1000 ug via INTRAMUSCULAR

## 2022-05-30 NOTE — Telephone Encounter (Signed)
Fyi  Patient was in today for a nurse visit. She reports a reaction of itchyness from last Vitamin B12 injection. After taken Benadryl, patient reports she was fine. Patient states she wanted to continue with the injection.

## 2022-05-30 NOTE — Telephone Encounter (Signed)
Patient called wanting to schedule an epidural injection for her back. CB # [phone]

## 2022-05-30 NOTE — Progress Notes (Signed)
Per orders of [employer], injection of VitB 12 1000 mcg given by Encarnacion Slates. Patient tolerated injection well. Patient reports reaction from last injection of itchyness. After taken Benadryl, patient reports she was fine. Patient states she wanted to continue with the injection.  Her next injection is due next week.

## 2022-05-31 ENCOUNTER — Telehealth: Payer: Self-pay

## 2022-05-31 NOTE — Telephone Encounter (Signed)
Patient has a nurse visit yesterday. She would like a call back in regards to a denied MRI for her wrist. Patient's concern was brought to Dr. Barbie Banner nurse attention.   Inform patient Dr. Sarajane Jews nurse would give her a call back.

## 2022-05-31 NOTE — Telephone Encounter (Signed)
Left detailed message for pt advised to call [facility] for scheduling per referral coordinator Paula Libra

## 2022-05-31 NOTE — Telephone Encounter (Signed)
IC advised would call to schedule once auth obtained.

## 2022-06-05 NOTE — Telephone Encounter (Signed)
IC patient and advised. Did let patient know this was being worked on.

## 2022-06-06 ENCOUNTER — Ambulatory Visit (INDEPENDENT_AMBULATORY_CARE_PROVIDER_SITE_OTHER): Payer: 59

## 2022-06-06 DIAGNOSIS — E538 Deficiency of other specified B group vitamins: Secondary | ICD-10-CM

## 2022-06-06 DIAGNOSIS — R2232 Localized swelling, mass and lump, left upper limb: Secondary | ICD-10-CM

## 2022-06-06 MED ORDER — CYANOCOBALAMIN 1000 MCG/ML IJ SOLN
1000.0000 ug | Freq: Once | INTRAMUSCULAR | Status: AC
  Administered 2022-06-06: 1000 ug via INTRAMUSCULAR

## 2022-06-06 NOTE — Progress Notes (Signed)
Per orders of Dorothyann Peng, NP,  injection of Cyanocobalamin Inj. 1000 mcg,  given by Encarnacion Slates on R deltoid.  Patient tolerated injection well.  Patient's 4th injection of Vit B12 is due next week.

## 2022-06-07 NOTE — Telephone Encounter (Signed)
Patient has been scheduled

## 2022-06-11 ENCOUNTER — Ambulatory Visit (INDEPENDENT_AMBULATORY_CARE_PROVIDER_SITE_OTHER): Payer: 59 | Admitting: Surgical

## 2022-06-11 DIAGNOSIS — M1711 Unilateral primary osteoarthritis, right knee: Secondary | ICD-10-CM | POA: Diagnosis not present

## 2022-06-11 DIAGNOSIS — M25532 Pain in left wrist: Secondary | ICD-10-CM | POA: Diagnosis not present

## 2022-06-11 DIAGNOSIS — M1712 Unilateral primary osteoarthritis, left knee: Secondary | ICD-10-CM | POA: Diagnosis not present

## 2022-06-11 DIAGNOSIS — R2232 Localized swelling, mass and lump, left upper limb: Secondary | ICD-10-CM

## 2022-06-11 NOTE — Telephone Encounter (Signed)
Pt is scheduled for Imaging appointment on 06/25/2022  8:10 AM

## 2022-06-11 NOTE — Progress Notes (Signed)
Office Visit Note   [name]           Date of Birth: 02-13-1966           MRN: [id number] Visit Date: 06/11/2022 Requested by: [name], MD [place],  [place] [address] PCP: [name], MD  Subjective: Chief Complaint  Patient presents with   Right Knee - Pain   Left Knee - Pain    HPI: Dana [name] is a 56 y.o. female who presents to the office complaining of bilateral knee pain.  Has history of bilateral knee osteoarthritis.  No recent injury.  Most of her pain is in the anterior aspect of the medial knee but also has occasional posterior pain.  Occasionally want to give out on her.  No groin pain.  Right knee bothers her more than the left knee.  She would like to try repeat injections which have given her good relief in the past..                ROS: All systems reviewed are negative as they relate to the chief complaint within the history of present illness.  Patient denies fevers or chills.  Assessment & Plan: Visit Diagnoses:  1. Unilateral primary osteoarthritis, left knee   2. Unilateral primary osteoarthritis, right knee   3. Mass of left wrist     Plan: Patient is a 56 year old female who returns for evaluation of bilateral knee pain.  Patient has history of bilateral knee osteoarthritis.  She has had previous injections with good relief.  She would like to repeat these today.  Both injections were administered into each knee and she tolerated the procedure well.  She has an upcoming event on Saturday that she would like to be as pain-free as possible for this..  She also notes a small soft tissue mass that she has noticed over the last several months.  No history of injury to this area.  She localizes this mass to the dorsal distal aspect of the forearm to the wrist on the left side.  She has seen her PCP who ordered ultrasound of the upper extremity to rule out DVT.  This was found to be negative but did identify a  hyperechoic mass measured about 1.5 x 1 cm.  She is having some nighttime pain that wakes her up at times in this location and is very tender to the touch.  Fairly mobile on exam today.  Ultrasound was applied and the mass was identified with similar echogenicity compared with the surrounding adipose tissue.  There is no atypical findings on color Doppler.  With some of the symptoms she is having that are little bit more concerning such as nighttime pain and the tenderness over this area, plan to reach out to her PCP Dr. Sarajane Jews to see his thoughts on changing from MRI without contrast to MRI with contrast for further evaluation of soft tissue mass..  Follow-Up Instructions: No follow-ups on file.   Orders:  No orders of the defined types were placed in this encounter.  No orders of the defined types were placed in this encounter.     Procedures: Large Joint Inj: bilateral knee on 06/11/2022 12:47 PM Indications: diagnostic evaluation, joint swelling and pain Details: 18 G 1.5 in needle, superolateral approach  Arthrogram: No  Medications (Right): 5 mL lidocaine 1 %; 4 mL bupivacaine 0.25 %; 40 mg methylPREDNISolone acetate 40 MG/ML Medications (Left): 5 mL lidocaine 1 %;  4 mL bupivacaine 0.25 %; 40 mg methylPREDNISolone acetate 40 MG/ML Outcome: tolerated well, no immediate complications Procedure, treatment alternatives, risks and benefits explained, specific risks discussed. Consent was given by the patient. Immediately prior to procedure a time out was called to verify the correct patient, procedure, equipment, support staff and site/side marked as required. Patient was prepped and draped in the usual sterile fashion.       Clinical Data: No additional findings.  Objective: Vital Signs: LMP 08/17/2012   Physical Exam:  Constitutional: Patient appears well-developed HEENT:  Head: Normocephalic Eyes:EOM are normal Neck: Normal range of motion Cardiovascular: Normal  rate Pulmonary/chest: Effort normal Neurologic: Patient is alert Skin: Skin is warm Psychiatric: Patient has normal mood and affect  Ortho Exam: Ortho exam demonstrates bilateral knees with no effusion.  Well-preserved active and passive range of motion of both knees.  Able to perform straight leg raise bilaterally.  No pain with hip range of motion.  Soft tissue mass identified in the left wrist on the distal aspect of the dorsal forearm.  It is fairly mobile and well-circumscribed.  It is fairly tender and causes patient's significant distress with palpation.  No fluctuance or increased warmth over this area.  Specialty Comments:  No specialty comments available.  Imaging: No results found.   PMFS History: Patient Active Problem List   Diagnosis Date Noted   Carpal tunnel syndrome, left upper limb 05/11/2019    Class: Chronic   Spinal stenosis of lumbar region with neurogenic claudication 08/20/2018   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   Primary osteoarthritis of right knee 02/27/2017   Right tennis elbow 11/11/2016   Muscle cramps 01/15/2016   Bilateral leg edema 01/08/2016   Radiculopathy 12/20/2015   Hyperglycemia 12/21/2014   Mastodynia, female 11/30/2014   S/P excision of fibroadenoma of breast 11/30/2014   Angioedema of lips 04/07/2014   Fibroadenoma of right breast 03/07/2014   Pelvic pain in female 06/19/2012   Menorrhagia 06/11/2012   SUI (stress urinary incontinence, female) 06/11/2012   HTN (hypertension) 06/11/2012   IBS (irritable bowel syndrome) - diarrhea predominant 03/17/2012   MICROSCOPIC HEMATURIA 11/30/2010   BREAST PAIN, RIGHT 11/30/2010   BREAST MASS, RIGHT 01/12/2010   HYPERCHOLESTEROLEMIA 12/07/2007   CIGARETTE SMOKER 12/07/2007   DEGENERATIVE JOINT DISEASE 12/07/2007   Low back pain with sciatica 12/07/2007   HEADACHE 12/07/2007   Obesity 08/03/2007   Anxiety state 08/03/2007   Past Medical History:  Diagnosis Date   Anxiety    Back  pain with radiation    Breast mass, right    Chronic pain    Chronic, continuous use of opioids    Complication of anesthesia 04/07/14   Allergic reaction to Lisinopril immediately following surgery   DJD (degenerative joint disease)    Fibromyalgia    Groin abscess    Headache(784.0)    History of IBS    Hypercholesterolemia    IBS (irritable bowel syndrome)    Lactose intolerance    Mild hypertension    Obesity    Tobacco use disorder    Umbilical hernia    Symptomatic    Family History  Problem Relation Age of Onset   Diabetes Father    Diabetes Mother    Prostate cancer Paternal Grandfather    Breast cancer Maternal Aunt 46    Past Surgical History:  Procedure Laterality Date   ABDOMINAL HYSTERECTOMY     ANTERIOR CERVICAL DECOMP/DISCECTOMY FUSION N/A 12/20/2015   Procedure: ANTERIOR CERVICAL DECOMPRESSION FUSION CERVICAL  4-5, CERVICAL 5-6, CERVICAL 6-7 WITH INSTRUMENTATION AND ALLOGRAFT;  Surgeon: Phylliss Bob, MD;  Location: [facility];  Service: Orthopedics;  Laterality: N/A;  Anterior cervical decompression fusion, cervical 4-5, cervical 5-6, cervical 6-7 with instrumentation and allograft   BACK SURGERY     BILATERAL SALPINGECTOMY  09/03/2012   Procedure: BILATERAL SALPINGECTOMY;  Surgeon: [name], MD;  Location: [facility];  Service: Gynecology;  Laterality: Bilateral;   BREAST BIOPSY Right 04/07/2014   Procedure: REMOVAL RIGHT BREAST MASS WITH WIRE LOCALIZATION;  Surgeon: [name], MD;  Location: [facility];  Service: General;  Laterality: Right;   BREAST EXCISIONAL BIOPSY Left    x2   BREAST LUMPECTOMY     x2   CARPAL TUNNEL RELEASE Left 05/11/2019   Procedure: LEFT CARPAL TUNNEL RELEASE, RIGHT TENNIS ELBOW MARCAINE/DEPO MEDROL INJECTION UNDER ANESTHESIA;  Surgeon: Jessy Oto, MD;  Location: [facility];  Service: Orthopedics;  Laterality: Left;   COLONOSCOPY W/ BIOPSIES  04/24/2012   per Dr. Carlean Purl, clear, repeat in 10 yrs    disectomy      ESOPHAGOGASTRODUODENOSCOPY     FINGER SURGERY     Right index-excision of mass    FOOT SURGERY Right    Bone Spurs   LAPAROSCOPIC HYSTERECTOMY  09/03/2012   Procedure: HYSTERECTOMY TOTAL LAPAROSCOPIC;  Surgeon: [name], MD;  Location: [facility];  Service: Gynecology;  Laterality: N/A;   LUMBAR DISC SURGERY     TUBAL LIGATION     UMBILICAL HERNIA REPAIR N/A 05/01/2018   Procedure: UMBILICAL HERNIA REPAIR;  Surgeon: Judeth Horn, MD;  Location: [facility];  Service: General;  Laterality: N/A;   Social History   Occupational History   Occupation: Optometrist  Tobacco Use   Smoking status: Former    Years: 33.00    Types: Cigarettes   Smokeless tobacco: Never   Tobacco comments:    quit March 2017  Vaping Use   Vaping Use: Never used  Substance and Sexual Activity   Alcohol use: Not Currently    Alcohol/week: 0.0 standard drinks of alcohol    Comment: occ   Drug use: Yes    Comment: chronic use of dilaudid   Sexual activity: Yes    Birth control/protection: Surgical

## 2022-06-13 ENCOUNTER — Ambulatory Visit (INDEPENDENT_AMBULATORY_CARE_PROVIDER_SITE_OTHER): Payer: 59

## 2022-06-13 ENCOUNTER — Encounter: Payer: Self-pay | Admitting: Surgical

## 2022-06-13 DIAGNOSIS — E538 Deficiency of other specified B group vitamins: Secondary | ICD-10-CM | POA: Diagnosis not present

## 2022-06-13 MED ORDER — METHYLPREDNISOLONE ACETATE 40 MG/ML IJ SUSP
40.0000 mg | INTRAMUSCULAR | Status: AC | PRN
  Administered 2022-06-11: 40 mg via INTRA_ARTICULAR

## 2022-06-13 MED ORDER — BUPIVACAINE HCL 0.25 % IJ SOLN
4.0000 mL | INTRAMUSCULAR | Status: AC | PRN
  Administered 2022-06-11: 4 mL via INTRA_ARTICULAR

## 2022-06-13 MED ORDER — LIDOCAINE HCL 1 % IJ SOLN
5.0000 mL | INTRAMUSCULAR | Status: AC | PRN
  Administered 2022-06-11: 5 mL

## 2022-06-13 MED ORDER — CYANOCOBALAMIN 1000 MCG/ML IJ SOLN
1000.0000 ug | Freq: Once | INTRAMUSCULAR | Status: AC
  Administered 2022-06-13: 1000 ug via INTRAMUSCULAR

## 2022-06-13 NOTE — Progress Notes (Signed)
Pt here for monthly B12 injection per Dr Sarajane Jews.  B12 1060mg given IM, and pt tolerated injection well.  Next B12 injection scheduled for next week.

## 2022-06-13 NOTE — Telephone Encounter (Signed)
Done

## 2022-06-19 ENCOUNTER — Ambulatory Visit: Payer: Self-pay

## 2022-06-19 ENCOUNTER — Ambulatory Visit (INDEPENDENT_AMBULATORY_CARE_PROVIDER_SITE_OTHER): Payer: 59 | Admitting: Physical Medicine and Rehabilitation

## 2022-06-19 ENCOUNTER — Other Ambulatory Visit: Payer: Self-pay | Admitting: Family Medicine

## 2022-06-19 DIAGNOSIS — M5416 Radiculopathy, lumbar region: Secondary | ICD-10-CM

## 2022-06-19 MED ORDER — DEXAMETHASONE SODIUM PHOSPHATE 10 MG/ML IJ SOLN
10.0000 mg | Freq: Once | INTRAMUSCULAR | Status: AC
  Administered 2022-06-19: 10 mg

## 2022-06-19 NOTE — Progress Notes (Signed)
Numeric Pain Rating Scale and Functional Assessment Average Pain 9   In the last MONTH (on 0-10 scale) has pain interfered with the following?  1. General activity like being  able to carry out your everyday physical activities such as walking, climbing stairs, carrying groceries, or moving a chair?  Rating(10)   +Driver, -BT, -Dye Allergies. Yes, No, No  Pain goes down into left leg

## 2022-06-19 NOTE — Patient Instructions (Signed)

## 2022-06-20 ENCOUNTER — Ambulatory Visit (INDEPENDENT_AMBULATORY_CARE_PROVIDER_SITE_OTHER): Payer: 59 | Admitting: *Deleted

## 2022-06-20 DIAGNOSIS — E538 Deficiency of other specified B group vitamins: Secondary | ICD-10-CM | POA: Diagnosis not present

## 2022-06-20 MED ORDER — CYANOCOBALAMIN 1000 MCG/ML IJ SOLN
1000.0000 ug | Freq: Once | INTRAMUSCULAR | Status: AC
  Administered 2022-06-20: 1000 ug via INTRAMUSCULAR

## 2022-06-20 NOTE — Telephone Encounter (Signed)
Last refill-11/23/21--90 tabs, 5 refills Last VV-05/10/22  No future OV scheduled.

## 2022-06-20 NOTE — Progress Notes (Signed)
Per orders of Cory Nafziger NP, injection of B12 given by Shiya Fogelman. Patient tolerated injection well. 

## 2022-06-25 ENCOUNTER — Other Ambulatory Visit: Payer: 59

## 2022-06-27 ENCOUNTER — Ambulatory Visit (INDEPENDENT_AMBULATORY_CARE_PROVIDER_SITE_OTHER): Payer: 59

## 2022-06-27 DIAGNOSIS — E538 Deficiency of other specified B group vitamins: Secondary | ICD-10-CM

## 2022-06-27 MED ORDER — CYANOCOBALAMIN 1000 MCG/ML IJ SOLN
1000.0000 ug | Freq: Once | INTRAMUSCULAR | Status: AC
  Administered 2022-06-27: 1000 ug via INTRAMUSCULAR

## 2022-06-27 NOTE — Progress Notes (Signed)
Per orders of Dr. Sarajane Jews, injection of Vit B12  given by Encarnacion Slates on Left Deltoid.   Patient tolerated injection well.

## 2022-07-01 ENCOUNTER — Ambulatory Visit: Payer: 59 | Admitting: Family Medicine

## 2022-07-01 NOTE — Procedures (Signed)
Lumbosacral Transforaminal Epidural Steroid Injection - Sub-Pedicular Approach with Fluoroscopic Guidance  [name]      Date of Birth: 1966-03-20 MRN: [id number] PCP: [name], MD      Visit Date: 06/19/2022   Universal Protocol:    Date/Time: 06/19/2022  Consent Given By: the patient  Position: PRONE  Additional Comments: Vital signs were monitored before and after the procedure. Patient was prepped and draped in the usual sterile fashion. The correct patient, procedure, and site was verified.   Injection Procedure Details:   Procedure diagnoses: Lumbar radiculopathy [M54.16]    Meds Administered:  Meds ordered this encounter  Medications   dexamethasone (DECADRON) injection 10 mg    Laterality: Bilateral  Location/Site: L1  Needle:5.0 in., 22 ga.  Short bevel or Quincke spinal needle  Needle Placement: Transforaminal  Findings:    -Comments: Excellent flow of contrast along the nerve, nerve root and into the epidural space.  Procedure Details: After squaring off the end-plates to get a true AP view, the C-arm was positioned so that an oblique view of the foramen as noted above was visualized. The target area is just inferior to the "nose of the scotty dog" or sub pedicular. The soft tissues overlying this structure were infiltrated with 2-3 ml. of 1% Lidocaine without Epinephrine.  The spinal needle was inserted toward the target using a "trajectory" view along the fluoroscope beam.  Under AP and lateral visualization, the needle was advanced so it did not puncture dura and was located close the 6 O'Clock position of the pedical in AP tracterory. Biplanar projections were used to confirm position. Aspiration was confirmed to be negative for CSF and/or blood. A 1-2 ml. volume of Isovue-250 was injected and flow of contrast was noted at each level. Radiographs were obtained for documentation purposes.   After attaining the desired flow of contrast  documented above, a 0.5 to 1.0 ml test dose of 0.25% Marcaine was injected into each respective transforaminal space.  The patient was observed for 90 seconds post injection.  After no sensory deficits were reported, and normal lower extremity motor function was noted,   the above injectate was administered so that equal amounts of the injectate were placed at each foramen (level) into the transforaminal epidural space.   Additional Comments:  The patient tolerated the procedure well Dressing: 2 x 2 sterile gauze and Band-Aid    Post-procedure details: Patient was observed during the procedure. Post-procedure instructions were reviewed.  Patient left the clinic in stable condition.

## 2022-07-01 NOTE — Progress Notes (Signed)
Dana [name] - 56 y.o. female MRN [id number]  Date of birth: [birth year]  Office Visit Note: Visit Date: 06/19/2022 PCP: [name], MD Referred by: Jessy Oto, MD  Subjective: No chief complaint on file.  HPI:  Dana [name] is a 56 y.o. female who comes in today for planned repeat Bilateral L1-2  Lumbar Transforaminal epidural steroid injection with fluoroscopic guidance.  The patient has failed conservative care including home exercise, medications, time and activity modification.  This injection will be diagnostic and hopefully therapeutic.  Please see requesting physician notes for further details and justification. Patient received more than 50% pain relief from prior injection.   Referring: Dr. Basil Dess   ROS Otherwise per HPI.  Assessment & Plan: Visit Diagnoses:    ICD-10-CM   1. Lumbar radiculopathy  M54.16 XR C-ARM NO REPORT    Epidural Steroid injection    dexamethasone (DECADRON) injection 10 mg      Plan: No additional findings.   Meds & Orders:  Meds ordered this encounter  Medications   dexamethasone (DECADRON) injection 10 mg    Orders Placed This Encounter  Procedures   XR C-ARM NO REPORT   Epidural Steroid injection    Follow-up: Return for visit to requesting provider as needed.   Procedures: No procedures performed  Lumbosacral Transforaminal Epidural Steroid Injection - Sub-Pedicular Approach with Fluoroscopic Guidance  [name]      Date of Birth: May 20, 1966 MRN: [id number] PCP: [name], MD      Visit Date: 06/19/2022   Universal Protocol:    Date/Time: 06/19/2022  Consent Given By: the patient  Position: PRONE  Additional Comments: Vital signs were monitored before and after the procedure. Patient was prepped and draped in the usual sterile fashion. The correct patient, procedure, and site was verified.   Injection Procedure Details:   Procedure diagnoses: Lumbar radiculopathy [M54.16]     Meds Administered:  Meds ordered this encounter  Medications   dexamethasone (DECADRON) injection 10 mg    Laterality: Bilateral  Location/Site: L1  Needle:5.0 in., 22 ga.  Short bevel or Quincke spinal needle  Needle Placement: Transforaminal  Findings:    -Comments: Excellent flow of contrast along the nerve, nerve root and into the epidural space.  Procedure Details: After squaring off the end-plates to get a true AP view, the C-arm was positioned so that an oblique view of the foramen as noted above was visualized. The target area is just inferior to the "nose of the scotty dog" or sub pedicular. The soft tissues overlying this structure were infiltrated with 2-3 ml. of 1% Lidocaine without Epinephrine.  The spinal needle was inserted toward the target using a "trajectory" view along the fluoroscope beam.  Under AP and lateral visualization, the needle was advanced so it did not puncture dura and was located close the 6 O'Clock position of the pedical in AP tracterory. Biplanar projections were used to confirm position. Aspiration was confirmed to be negative for CSF and/or blood. A 1-2 ml. volume of Isovue-250 was injected and flow of contrast was noted at each level. Radiographs were obtained for documentation purposes.   After attaining the desired flow of contrast documented above, a 0.5 to 1.0 ml test dose of 0.25% Marcaine was injected into each respective transforaminal space.  The patient was observed for 90 seconds post injection.  After no sensory deficits were reported, and normal lower extremity motor function was noted,   the above injectate  was administered so that equal amounts of the injectate were placed at each foramen (level) into the transforaminal epidural space.   Additional Comments:  The patient tolerated the procedure well Dressing: 2 x 2 sterile gauze and Band-Aid    Post-procedure details: Patient was observed during the procedure. Post-procedure  instructions were reviewed.  Patient left the clinic in stable condition.    Clinical History: No specialty comments available.     Objective:  VS:  HT:    WT:   BMI:     BP:   HR: bpm  TEMP: ( )  RESP:  Physical Exam Vitals and nursing note reviewed.  Constitutional:      General: She is not in acute distress.    Appearance: Normal appearance. She is obese. She is not ill-appearing.  HENT:     Head: Normocephalic and atraumatic.     Right Ear: External ear normal.     Left Ear: External ear normal.  Eyes:     Extraocular Movements: Extraocular movements intact.  Cardiovascular:     Rate and Rhythm: Normal rate.     Pulses: Normal pulses.  Pulmonary:     Effort: Pulmonary effort is normal. No respiratory distress.  Abdominal:     General: There is no distension.     Palpations: Abdomen is soft.  Musculoskeletal:        General: Tenderness present.     Cervical back: Neck supple.     Right lower leg: No edema.     Left lower leg: No edema.     Comments: Patient has good distal strength with no pain over the greater trochanters.  No clonus or focal weakness.  Skin:    Findings: No erythema, lesion or rash.  Neurological:     General: No focal deficit present.     Mental Status: She is alert and oriented to person, place, and time.     Sensory: No sensory deficit.     Motor: No weakness or abnormal muscle tone.     Coordination: Coordination normal.  Psychiatric:        Mood and Affect: Mood normal.        Behavior: Behavior normal.      Imaging: No results found.

## 2022-07-04 ENCOUNTER — Telehealth: Payer: Self-pay

## 2022-07-04 ENCOUNTER — Ambulatory Visit: Payer: 59

## 2022-07-04 ENCOUNTER — Telehealth (INDEPENDENT_AMBULATORY_CARE_PROVIDER_SITE_OTHER): Payer: 59 | Admitting: Family Medicine

## 2022-07-04 DIAGNOSIS — J029 Acute pharyngitis, unspecified: Secondary | ICD-10-CM

## 2022-07-04 DIAGNOSIS — R059 Cough, unspecified: Secondary | ICD-10-CM

## 2022-07-04 DIAGNOSIS — R0981 Nasal congestion: Secondary | ICD-10-CM | POA: Diagnosis not present

## 2022-07-04 MED ORDER — DOXYCYCLINE HYCLATE 100 MG PO TABS: 100.0000 mg | ORAL_TABLET | Freq: Two times a day (BID) | ORAL | 0 refills | Status: DC

## 2022-07-04 MED ORDER — BENZONATATE 100 MG PO CAPS: ORAL_CAPSULE | ORAL | 0 refills | Status: DC

## 2022-07-04 NOTE — Patient Instructions (Signed)
  HOME CARE TIPS:  -COVID19 testing information: ForwardDrop.tn  Most pharmacies also offer testing and home test kits. If the Covid19 test is positive and you desire antiviral treatment, please contact a [facility] or schedule a follow up virtual visit through your primary care office or through the [facility].  Other test to treat options: ConnectRV.is?click_[email or web address]=alert  -I sent the medication(s) we discussed to your pharmacy: Meds ordered this encounter  Medications   doxycycline (VIBRA-TABS) 100 MG tablet    Sig: Take 1 tablet (100 mg total) by mouth 2 (two) times daily.    Dispense:  20 tablet    Refill:  0   benzonatate (TESSALON PERLES) 100 MG capsule    Sig: 1-2 capsules up to twice daily as needed for cough    Dispense:  30 capsule    Refill:  0     -can use ibuprofen needed for fevers, aches and pains per instructions  -nasal saline sinus rinses twice daily, warm salt water gargles twice daily for 3-4 days  -stay hydrated, drink plenty of fluids and eat small healthy meals - avoid dairy  -can take 1000 IU (75mg) Vit D3 and 100-500 mg of Vit C daily per instructions  -follow up with your doctor in 2-3 days unless improving and feeling better  -stay home while sick, except to seek medical care. If you have COVID19, you will likely be contagious for 7-10 days. Flu or Influenza is likely contagious for about 7 days. Other respiratory viral infections remain contagious for 5-10+ days depending on the virus and many other factors. Wear a good mask that fits snugly (such as N95 or KN95) if around others to reduce the risk of transmission.  It was nice to meet you today, and I really hope you are feeling better soon. I help [facility] out with telemedicine visits on Tuesdays and Thursdays and am happy to help if you need a follow up virtual visit on those days. Otherwise, if you have any concerns or questions  following this visit please schedule a follow up visit with your Primary Care doctor or seek care at a local urgent care clinic to avoid delays in care.    Seek in person care or schedule a follow up video visit promptly if your symptoms worsen, new concerns arise or you are not improving with treatment. Call 911 and/or seek emergency care if your symptoms are severe or life threatening.

## 2022-07-04 NOTE — Telephone Encounter (Signed)
---  Caller states has sore throat and nose stopped up and coughing. She is unable to drive because of some eye work yesterday. She would like to get something called in for her. Robitussin  07/03/2022 3:45:25 PM See HCP within 4 Hours (or PCP triage) Lenon Curt, RN, [place]  Comments User: Erik Obey, RN Date/Time Eilene Ghazi Time): 07/03/2022 3:41:42 PM Green mucus, maybe some blood yesterday. User: Erik Obey, RN Date/Time Eilene Ghazi Time): 07/03/2022 3:44:24 PM [facility] [phone] User: Erik Obey, RN Date/Time Eilene Ghazi Time): 07/03/2022 3:49:18 PM Caller just wants some meds (antibiotics) called in, does not want to deal with VV or UC. User: Erik Obey, RN Date/Time Eilene Ghazi Time): 07/03/2022 3:49:37 PM Pt requesting a call back from the office.  Referrals GO TO FACILITY REFUSED  07/04/22 0942 - Pt states sore throat, nasal congestion, & cough; symptoms started x 4 days ago. Pt has taken OTC Robutussin that has been helpful. Neg covid home test on yesterday. Advised that pt needs video visit. Pt verb understanding; VV appt made for today at 11a with Dr Maudie Mercury.

## 2022-07-04 NOTE — Progress Notes (Signed)
Virtual Visit via Video Note  I connected with Demitria  on 07/04/22 at 11:00 AM EDT by a video enabled telemedicine application and verified that I am speaking with the correct person using two identifiers.  Location patient: [place] Location provider:work or home office Persons participating in the virtual visit: patient, provider  I discussed the limitations and requested verbal permission for telemedicine visit. The patient expressed understanding and agreed to proceed.   HPI:  Acute telemedicine visit for congestion and sore throat: -Onset:4 days ago -did a covid test which was negative -Symptoms include: thick nasal congestion, cough, drainage in throat, fever, sore throat -Denies:CP, SOB, NVD -she is requesting levaquin as reports "this works well for me" -Has tried: robitussin with honey -Pertinent past medical history: see below -Pertinent medication allergies: Allergies  Allergen Reactions   Lisinopril Anaphylaxis    was hospitalized for 3 days   Codeine Hives   Hydrocodone Hives   Penicillins Itching and Swelling    PATIENT HAS HAD A PCN REACTION WITH IMMEDIATE RASH, FACIAL/TONGUE/THROAT SWELLING, SOB, OR LIGHTHEADEDNESS WITH HYPOTENSION:  #  #  YES  #  #  Has patient had a PCN reaction causing severe rash involving mucus membranes or skin necrosis: No Has patient had a PCN reaction that required hospitalization No Has patient had a PCN reaction occurring within the last 10 years: No If all of the above answers are "NO", then may proceed with Cephalosporin use.    Amlodipine Swelling   Acetaminophen Rash  -COVID-19 vaccine status:  Immunization History  Administered Date(s) Administered   Influenza Split 07/16/2012, 06/12/2013   PFIZER(Purple Top)SARS-COV-2 Vaccination 12/08/2019, 12/29/2019, 09/26/2020   Pneumococcal Polysaccharide-23 09/04/2012     ROS: See pertinent positives and negatives per HPI.  Past Medical History:  Diagnosis Date   Anxiety    Back pain  with radiation    Breast mass, right    Chronic pain    Chronic, continuous use of opioids    Complication of anesthesia 04/07/14   Allergic reaction to Lisinopril immediately following surgery   DJD (degenerative joint disease)    Fibromyalgia    Groin abscess    Headache(784.0)    History of IBS    Hypercholesterolemia    IBS (irritable bowel syndrome)    Lactose intolerance    Mild hypertension    Obesity    Tobacco use disorder    Umbilical hernia    Symptomatic    Past Surgical History:  Procedure Laterality Date   ABDOMINAL HYSTERECTOMY     ANTERIOR CERVICAL DECOMP/DISCECTOMY FUSION N/A 12/20/2015   Procedure: ANTERIOR CERVICAL DECOMPRESSION FUSION CERVICAL 4-5, CERVICAL 5-6, CERVICAL 6-7 WITH INSTRUMENTATION AND ALLOGRAFT;  Surgeon: Phylliss Bob, MD;  Location: [facility];  Service: Orthopedics;  Laterality: N/A;  Anterior cervical decompression fusion, cervical 4-5, cervical 5-6, cervical 6-7 with instrumentation and allograft   BACK SURGERY     BILATERAL SALPINGECTOMY  09/03/2012   Procedure: BILATERAL SALPINGECTOMY;  Surgeon: [name], MD;  Location: [facility];  Service: Gynecology;  Laterality: Bilateral;   BREAST BIOPSY Right 04/07/2014   Procedure: REMOVAL RIGHT BREAST MASS WITH WIRE LOCALIZATION;  Surgeon: [name], MD;  Location: [facility];  Service: General;  Laterality: Right;   BREAST EXCISIONAL BIOPSY Left    x2   BREAST LUMPECTOMY     x2   CARPAL TUNNEL RELEASE Left 05/11/2019   Procedure: LEFT CARPAL TUNNEL RELEASE, RIGHT TENNIS ELBOW MARCAINE/DEPO MEDROL INJECTION UNDER ANESTHESIA;  Surgeon: Jessy Oto, MD;  Location: [facility];  Service: Orthopedics;  Laterality: Left;   COLONOSCOPY W/ BIOPSIES  04/24/2012   per Dr. Carlean Purl, clear, repeat in 10 yrs    disectomy     ESOPHAGOGASTRODUODENOSCOPY     FINGER SURGERY     Right index-excision of mass    FOOT SURGERY Right    Bone Spurs   LAPAROSCOPIC HYSTERECTOMY  09/03/2012   Procedure: HYSTERECTOMY  TOTAL LAPAROSCOPIC;  Surgeon: [name], MD;  Location: [facility];  Service: Gynecology;  Laterality: N/A;   LUMBAR DISC SURGERY     TUBAL LIGATION     UMBILICAL HERNIA REPAIR N/A 05/01/2018   Procedure: UMBILICAL HERNIA REPAIR;  Surgeon: Judeth Horn, MD;  Location: [facility];  Service: General;  Laterality: N/A;     Current Outpatient Medications:    albuterol (VENTOLIN HFA) 108 (90 Base) MCG/ACT inhaler, Inhale 2 puffs into the lungs every 6 (six) hours as needed for wheezing or shortness of breath., Disp: 1 each, Rfl: 3   ALPRAZolam (XANAX) 0.5 MG tablet, TAKE 1 TABLET (0.5 MG TOTAL) BY MOUTH 3 (THREE) TIMES DAILY AS NEEDED FOR ANXIETY., Disp: 90 tablet, Rfl: 5   benzonatate (TESSALON PERLES) 100 MG capsule, 1-2 capsules up to twice daily as needed for cough, Disp: 30 capsule, Rfl: 0   cyclobenzaprine (FLEXERIL) 10 MG tablet, TAKE 1 TABLET BY MOUTH EVERYDAY AT BEDTIME, Disp: 30 tablet, Rfl: 2   dorzolamide-timolol (COSOPT) 2-0.5 % ophthalmic solution, SMARTSIG:In Eye(s), Disp: , Rfl:    doxycycline (VIBRA-TABS) 100 MG tablet, Take 1 tablet (100 mg total) by mouth 2 (two) times daily., Disp: 20 tablet, Rfl: 0   fluticasone (FLONASE) 50 MCG/ACT nasal spray, SPRAY 2 SPRAYS INTO EACH NOSTRIL EVERY DAY, Disp: 16 mL, Rfl: 11   folic acid (FOLVITE) 1 MG tablet, Take 1 tablet (1 mg total) by mouth daily., Disp: 90 tablet, Rfl: 3   furosemide (LASIX) 40 MG tablet, TAKE 2 TABLETS IN THE MORNING AND ONE IN THE EVENING (Patient taking differently: Take 40 mg by mouth in the morning and at bedtime.), Disp: 270 tablet, Rfl: 1   [START ON 07/10/2022] HYDROmorphone (DILAUDID) 4 MG tablet, Take 1 tablet (4 mg total) by mouth every 6 (six) hours as needed for severe pain. In the morning and at bedtime, Disp: 120 tablet, Rfl: 0   latanoprost (XALATAN) 0.005 % ophthalmic solution, 1 drop at bedtime., Disp: , Rfl:    metoprolol tartrate (LOPRESSOR) 50 MG tablet, Take 1 tablet (50 mg total) by mouth 2 (two) times  daily., Disp: 180 tablet, Rfl: 3   ondansetron (ZOFRAN) 4 MG tablet, Take 1 tablet (4 mg total) by mouth every 8 (eight) hours as needed for nausea or vomiting., Disp: 60 tablet, Rfl: 2   pantoprazole (PROTONIX) 40 MG tablet, Take 40 mg by mouth daily., Disp: , Rfl:    potassium chloride (KLOR-CON 10) 10 MEQ tablet, Take 1 tablet (10 mEq total) by mouth 2 (two) times daily. (Patient taking differently: Take 10 mEq by mouth daily.), Disp: 180 tablet, Rfl: 3   sucralfate (CARAFATE) 1 GM/10ML suspension, Take 10 mLs (1 g total) by mouth 4 (four) times daily -  with meals and at bedtime., Disp: 420 mL, Rfl: 0   temazepam (RESTORIL) 30 MG capsule, TAKE 1 CAPSULE (30 MG TOTAL) BY MOUTH AT BEDTIME AS NEEDED FOR SLEEP., Disp: 30 capsule, Rfl: 5   magic mouthwash (nystatin, hydrocortisone, diphenhydrAMINE) suspension, Swish and swallow 5 mLs as directed. (Patient not taking: Reported on 07/04/2022),  Disp: 120 mL, Rfl: 0   meloxicam (MOBIC) 15 MG tablet, as needed. (Patient not taking: Reported on 07/04/2022), Disp: , Rfl:   EXAM:  VITALS per patient if applicable:  GENERAL: alert, oriented, appears well and in no acute distress  HEENT: atraumatic, conjunttiva clear, no obvious abnormalities on inspection of external nose and ears, moist mucus membranes, some erythema of post oropharynx without sig tonsillar edema or exudate  NECK: normal movements of the head and neck  LUNGS: on inspection no signs of respiratory distress, breathing rate appears normal, no obvious gross SOB, gasping or wheezing  CV: no obvious cyanosis  MS: moves all visible extremities without noticeable abnormality  PSYCH/NEURO: pleasant and cooperative, no obvious depression or anxiety, speech and thought processing grossly intact  ASSESSMENT AND PLAN:  Discussed the following assessment and plan:  Nasal congestion  Cough, unspecified type  Sore throat  -we discussed possible serious and likely etiologies, options for  evaluation and workup, limitations of telemedicine visit vs in person visit, treatment, treatment risks and precautions. Pt is agreeable to treatment via telemedicine at this moment. Query sinusitis, vuru, influenza, covid vs other. Advised repeat covid testing. Out of window for likely benefit from tamiflu. She is requesting levaquin. Discussed appropriate use of abx and risks for various options if uses delayed abx rx. Opted for nasal saline rinse, salt water gargle, analgesic (she reports she only can take ibuprofen), tessalon rx for cough and doxy if worsening or not improving as expected.  \ Advised to seek prompt virtual visit or in person care if worsening, new symptoms arise, or if is not improving with treatment as expected per our conversation of expected course. Discussed options for follow up care. Did let this patient know that I do telemedicine on Tuesdays and Thursdays for [facility] and those are the days I am logged into the system. Advised to schedule follow up visit with PCP, [facility] virtual visits or [facility] if any further questions or concerns to avoid delays in care.   I discussed the assessment and treatment plan with the patient. The patient was provided an opportunity to ask questions and all were answered. The patient agreed with the plan and demonstrated an understanding of the instructions.     Lucretia Kern, DO

## 2022-07-05 ENCOUNTER — Encounter: Payer: Self-pay | Admitting: Internal Medicine

## 2022-07-10 ENCOUNTER — Ambulatory Visit: Payer: 59 | Admitting: Family Medicine

## 2022-07-12 ENCOUNTER — Ambulatory Visit (INDEPENDENT_AMBULATORY_CARE_PROVIDER_SITE_OTHER): Payer: 59 | Admitting: Surgical

## 2022-07-12 DIAGNOSIS — M5416 Radiculopathy, lumbar region: Secondary | ICD-10-CM

## 2022-07-13 MED ORDER — PREDNISONE 10 MG (21) PO TBPK: ORAL_TABLET | ORAL | 0 refills | Status: DC

## 2022-07-13 MED ORDER — GABAPENTIN 300 MG PO CAPS: 300.0000 mg | ORAL_CAPSULE | Freq: Three times a day (TID) | ORAL | 0 refills | Status: DC

## 2022-07-14 ENCOUNTER — Encounter: Payer: Self-pay | Admitting: Surgical

## 2022-07-14 NOTE — Progress Notes (Signed)
Office Visit Note   [name]           Date of Birth: 23-Mar-1966           MRN: [id number] Visit Date: 07/12/2022 Requested by: [name], MD [place],  [place] [id number] PCP: [name], MD  Subjective: Chief Complaint  Patient presents with   Left Leg - Pain    HPI: Dana [name] is a 56 y.o. female who presents to the office reporting left leg numbness.  Patient states that last Saturday she was walking to her bathroom when she felt a pull in her left low back with radiation down into her groin and down to the mid shin.  She feels that her leg has been numb since this incident and has been progressively worsening in terms of her numbness and weakness.  No right-sided symptoms aside from some bilateral feet numbness that is typical for her.  Most of the numbness is in the thigh anteriorly of the left leg.  She has difficulty with hip flexion since this event.  She is not having any saddle anesthesia or any symptoms of incontinence.  She has history of 1 spine surgery by Dr. Louanne Skye around 2005.  She takes hydromorphone chronically from pain management but this is not really cutting it in regards to controlling her pain..  She had recent ESI by Dr. Ernestina Patches about 3 to 4 weeks ago that gave her some relief but now since his recent event about a week ago, everything is much worse.              ROS: All systems reviewed are negative as they relate to the chief complaint within the history of present illness.  Patient denies fevers or chills.  Assessment & Plan: Visit Diagnoses:  1. Lumbar radiculopathy     Plan: Patient is a 56 year old female who presents for evaluation of left low back pain with left leg numbness and weakness.  She has numbness throughout the anterior thigh of the left leg since an event last Saturday.  She also has progressively worsening hip flexion weakness and she states that she cannot really lift her leg up.  On exam today,  she cannot even lift her left leg off of the bed while sitting.  No red flag symptoms concerning for cauda equina.  Plan to order new MRI of the lumbar spine for further evaluation of new weakness and numbness; this was ordered urgently to be done hopefully over the weekend or early next week..  Last MRI was from 2019.  Follow-up after MRI to review results and may need evaluation by Dr. Laurance Flatten.  She will need open MRI.  Prescribed gabapentin and Medrol Dosepak for symptomatic relief in the meantime.  Follow-Up Instructions: No follow-ups on file.   Orders:  Orders Placed This Encounter  Procedures   MR Lumbar Spine w/o contrast   Meds ordered this encounter  Medications   predniSONE (STERAPRED UNI-PAK 21 TAB) 10 MG (21) TBPK tablet    Sig: Take as directed on package    Dispense:  21 tablet    Refill:  0   gabapentin (NEURONTIN) 300 MG capsule    Sig: Take 1 capsule (300 mg total) by mouth 3 (three) times daily.    Dispense:  30 capsule    Refill:  0      Procedures: No procedures performed   Clinical Data: No additional findings.  Objective: Vital Signs:  LMP 08/17/2012   Physical Exam:  Constitutional: Patient appears well-developed HEENT:  Head: Normocephalic Eyes:EOM are normal Neck: Normal range of motion Cardiovascular: Normal rate Pulmonary/chest: Effort normal Neurologic: Patient is alert Skin: Skin is warm Psychiatric: Patient has normal mood and affect  Ortho Exam: Ortho exam demonstrates bilateral lower extremities with 5/5 quadricep, hamstring, dorsiflexion, plantarflexion strength.  She has 1+ patellar tendon reflexes bilaterally.  No clonus bilaterally.  She does have 2/5 hip flexion weakness of the left leg compared with 5/5 hip flexion strength on the right side.  Excellent hip abduction strength bilaterally.  No pain with hip range of motion whatsoever with negative FADIR sign.  She is not having any groin pain throughout exam or by her  history.  Specialty Comments:  No specialty comments available.  Imaging: No results found.   PMFS History: Patient Active Problem List   Diagnosis Date Noted   Carpal tunnel syndrome, left upper limb 05/11/2019    Class: Chronic   Spinal stenosis of lumbar region with neurogenic claudication 08/20/2018   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   Primary osteoarthritis of right knee 02/27/2017   Right tennis elbow 11/11/2016   Muscle cramps 01/15/2016   Bilateral leg edema 01/08/2016   Radiculopathy 12/20/2015   Hyperglycemia 12/21/2014   Mastodynia, female 11/30/2014   S/P excision of fibroadenoma of breast 11/30/2014   Angioedema of lips 04/07/2014   Fibroadenoma of right breast 03/07/2014   Pelvic pain in female 06/19/2012   Menorrhagia 06/11/2012   SUI (stress urinary incontinence, female) 06/11/2012   HTN (hypertension) 06/11/2012   IBS (irritable bowel syndrome) - diarrhea predominant 03/17/2012   MICROSCOPIC HEMATURIA 11/30/2010   BREAST PAIN, RIGHT 11/30/2010   BREAST MASS, RIGHT 01/12/2010   HYPERCHOLESTEROLEMIA 12/07/2007   CIGARETTE SMOKER 12/07/2007   DEGENERATIVE JOINT DISEASE 12/07/2007   Low back pain with sciatica 12/07/2007   HEADACHE 12/07/2007   Obesity 08/03/2007   Anxiety state 08/03/2007   Past Medical History:  Diagnosis Date   Anxiety    Back pain with radiation    Breast mass, right    Chronic pain    Chronic, continuous use of opioids    Complication of anesthesia 04/07/14   Allergic reaction to Lisinopril immediately following surgery   DJD (degenerative joint disease)    Fibromyalgia    Groin abscess    Headache(784.0)    History of IBS    Hypercholesterolemia    IBS (irritable bowel syndrome)    Lactose intolerance    Mild hypertension    Obesity    Tobacco use disorder    Umbilical hernia    Symptomatic    Family History  Problem Relation Age of Onset   Diabetes Father    Diabetes Mother    Prostate cancer Paternal  Grandfather    Breast cancer Maternal Aunt 46    Past Surgical History:  Procedure Laterality Date   ABDOMINAL HYSTERECTOMY     ANTERIOR CERVICAL DECOMP/DISCECTOMY FUSION N/A 12/20/2015   Procedure: ANTERIOR CERVICAL DECOMPRESSION FUSION CERVICAL 4-5, CERVICAL 5-6, CERVICAL 6-7 WITH INSTRUMENTATION AND ALLOGRAFT;  Surgeon: Phylliss Bob, MD;  Location: [facility];  Service: Orthopedics;  Laterality: N/A;  Anterior cervical decompression fusion, cervical 4-5, cervical 5-6, cervical 6-7 with instrumentation and allograft   BACK SURGERY     BILATERAL SALPINGECTOMY  09/03/2012   Procedure: BILATERAL SALPINGECTOMY;  Surgeon: [name], MD;  Location: [facility];  Service: Gynecology;  Laterality: Bilateral;   BREAST BIOPSY Right 04/07/2014   Procedure:  REMOVAL RIGHT BREAST MASS WITH WIRE LOCALIZATION;  Surgeon: [name], MD;  Location: [facility];  Service: General;  Laterality: Right;   BREAST EXCISIONAL BIOPSY Left    x2   BREAST LUMPECTOMY     x2   CARPAL TUNNEL RELEASE Left 05/11/2019   Procedure: LEFT CARPAL TUNNEL RELEASE, RIGHT TENNIS ELBOW MARCAINE/DEPO MEDROL INJECTION UNDER ANESTHESIA;  Surgeon: Jessy Oto, MD;  Location: [facility];  Service: Orthopedics;  Laterality: Left;   COLONOSCOPY W/ BIOPSIES  04/24/2012   per Dr. Carlean Purl, clear, repeat in 10 yrs    disectomy     ESOPHAGOGASTRODUODENOSCOPY     FINGER SURGERY     Right index-excision of mass    FOOT SURGERY Right    Bone Spurs   LAPAROSCOPIC HYSTERECTOMY  09/03/2012   Procedure: HYSTERECTOMY TOTAL LAPAROSCOPIC;  Surgeon: [name], MD;  Location: [facility];  Service: Gynecology;  Laterality: N/A;   LUMBAR DISC SURGERY     TUBAL LIGATION     UMBILICAL HERNIA REPAIR N/A 05/01/2018   Procedure: UMBILICAL HERNIA REPAIR;  Surgeon: Judeth Horn, MD;  Location: [facility];  Service: General;  Laterality: N/A;   Social History   Occupational History   Occupation: Optometrist  Tobacco Use   Smoking status: Former    Years:  33.00    Types: Cigarettes   Smokeless tobacco: Never   Tobacco comments:    quit March 2017  Vaping Use   Vaping Use: Never used  Substance and Sexual Activity   Alcohol use: Not Currently    Alcohol/week: 0.0 standard drinks of alcohol    Comment: occ   Drug use: Yes    Comment: chronic use of dilaudid   Sexual activity: Yes    Birth control/protection: Surgical

## 2022-07-17 ENCOUNTER — Other Ambulatory Visit: Payer: 59

## 2022-07-26 ENCOUNTER — Ambulatory Visit: Payer: 59

## 2022-07-28 ENCOUNTER — Other Ambulatory Visit: Payer: 59

## 2022-08-02 ENCOUNTER — Ambulatory Visit: Payer: 59 | Admitting: Surgical

## 2022-08-11 ENCOUNTER — Inpatient Hospital Stay: Admission: RE | Admit: 2022-08-11 | Payer: 59 | Source: Ambulatory Visit

## 2022-08-16 ENCOUNTER — Ambulatory Visit: Payer: 59 | Admitting: Surgical

## 2022-08-28 ENCOUNTER — Other Ambulatory Visit: Payer: 59

## 2022-08-30 ENCOUNTER — Ambulatory Visit: Payer: 59 | Admitting: Surgical

## 2022-09-02 ENCOUNTER — Telehealth (INDEPENDENT_AMBULATORY_CARE_PROVIDER_SITE_OTHER): Payer: 59 | Admitting: Family Medicine

## 2022-09-02 ENCOUNTER — Encounter: Payer: Self-pay | Admitting: Family Medicine

## 2022-09-02 DIAGNOSIS — M48062 Spinal stenosis, lumbar region with neurogenic claudication: Secondary | ICD-10-CM

## 2022-09-02 DIAGNOSIS — F1190 Opioid use, unspecified, uncomplicated: Secondary | ICD-10-CM

## 2022-09-02 MED ORDER — CYCLOBENZAPRINE HCL 10 MG PO TABS: ORAL_TABLET | ORAL | 3 refills | Status: DC

## 2022-09-02 MED ORDER — HYDROMORPHONE HCL 4 MG PO TABS: 4.0000 mg | ORAL_TABLET | Freq: Four times a day (QID) | ORAL | 0 refills | Status: DC | PRN

## 2022-09-02 MED ORDER — FUROSEMIDE 40 MG PO TABS: 40.0000 mg | ORAL_TABLET | Freq: Two times a day (BID) | ORAL | 3 refills | Status: DC

## 2022-09-02 MED ORDER — FOLIC ACID 1 MG PO TABS: 1.0000 mg | ORAL_TABLET | Freq: Every day | ORAL | 3 refills | Status: DC

## 2022-09-02 MED ORDER — POTASSIUM CHLORIDE ER 10 MEQ PO TBCR: 10.0000 meq | EXTENDED_RELEASE_TABLET | Freq: Two times a day (BID) | ORAL | 3 refills | Status: DC

## 2022-09-02 MED ORDER — METOPROLOL TARTRATE 50 MG PO TABS: 50.0000 mg | ORAL_TABLET | Freq: Two times a day (BID) | ORAL | 3 refills | Status: DC

## 2022-09-02 MED ORDER — TEMAZEPAM 30 MG PO CAPS: 30.0000 mg | ORAL_CAPSULE | Freq: Every evening | ORAL | 5 refills | Status: DC | PRN

## 2022-09-02 NOTE — Progress Notes (Signed)
Subjective:    Patient ID: Dana [name], female    DOB: 14-Mar-1966, 56 y.o.   MRN: [id number]  HPI Virtual Visit via Video Note  I connected with the patient on 09/02/22 at  1:15 PM EST by a video enabled telemedicine application and verified that I am speaking with the correct person using two identifiers.  Location patient: home Location provider:work or home office Persons participating in the virtual visit: patient, provider  I discussed the limitations of evaluation and management by telemedicine and the availability of in person appointments. The patient expressed understanding and agreed to proceed.   HPI: Here for pain management. Her back pain is about the same.    ROS: See pertinent positives and negatives per HPI.  Past Medical History:  Diagnosis Date   Anxiety    Back pain with radiation    Breast mass, right    Chronic pain    Chronic, continuous use of opioids    Complication of anesthesia 04/07/14   Allergic reaction to Lisinopril immediately following surgery   DJD (degenerative joint disease)    Fibromyalgia    Groin abscess    Headache(784.0)    History of IBS    Hypercholesterolemia    IBS (irritable bowel syndrome)    Lactose intolerance    Mild hypertension    Obesity    Tobacco use disorder    Umbilical hernia    Symptomatic    Past Surgical History:  Procedure Laterality Date   ABDOMINAL HYSTERECTOMY     ANTERIOR CERVICAL DECOMP/DISCECTOMY FUSION N/A 12/20/2015   Procedure: ANTERIOR CERVICAL DECOMPRESSION FUSION CERVICAL 4-5, CERVICAL 5-6, CERVICAL 6-7 WITH INSTRUMENTATION AND ALLOGRAFT;  Surgeon: Phylliss Bob, MD;  Location: [place];  Service: Orthopedics;  Laterality: N/A;  Anterior cervical decompression fusion, cervical 4-5, cervical 5-6, cervical 6-7 with instrumentation and allograft   BACK SURGERY     BILATERAL SALPINGECTOMY  09/03/2012   Procedure: BILATERAL SALPINGECTOMY;  Surgeon: [name], MD;  Location: [facility];   Service: Gynecology;  Laterality: Bilateral;   BREAST BIOPSY Right 04/07/2014   Procedure: REMOVAL RIGHT BREAST MASS WITH WIRE LOCALIZATION;  Surgeon: [name], MD;  Location: [place];  Service: General;  Laterality: Right;   BREAST EXCISIONAL BIOPSY Left    x2   BREAST LUMPECTOMY     x2   CARPAL TUNNEL RELEASE Left 05/11/2019   Procedure: LEFT CARPAL TUNNEL RELEASE, RIGHT TENNIS ELBOW MARCAINE/DEPO MEDROL INJECTION UNDER ANESTHESIA;  Surgeon: Jessy Oto, MD;  Location: [place];  Service: Orthopedics;  Laterality: Left;   COLONOSCOPY W/ BIOPSIES  04/24/2012   per Dr. Carlean Purl, clear, repeat in 10 yrs    disectomy     ESOPHAGOGASTRODUODENOSCOPY     FINGER SURGERY     Right index-excision of mass    FOOT SURGERY Right    Bone Spurs   LAPAROSCOPIC HYSTERECTOMY  09/03/2012   Procedure: HYSTERECTOMY TOTAL LAPAROSCOPIC;  Surgeon: [name], MD;  Location: [facility];  Service: Gynecology;  Laterality: N/A;   LUMBAR DISC SURGERY     TUBAL LIGATION     UMBILICAL HERNIA REPAIR N/A 05/01/2018   Procedure: UMBILICAL HERNIA REPAIR;  Surgeon: Judeth Horn, MD;  Location: [place];  Service: General;  Laterality: N/A;    Family History  Problem Relation Age of Onset   Diabetes Father    Diabetes Mother    Prostate cancer Paternal Grandfather    Breast cancer Maternal Aunt 36     Current  Outpatient Medications:    albuterol (VENTOLIN HFA) 108 (90 Base) MCG/ACT inhaler, Inhale 2 puffs into the lungs every 6 (six) hours as needed for wheezing or shortness of breath., Disp: 1 each, Rfl: 3   ALPRAZolam (XANAX) 0.5 MG tablet, TAKE 1 TABLET (0.5 MG TOTAL) BY MOUTH 3 (THREE) TIMES DAILY AS NEEDED FOR ANXIETY., Disp: 90 tablet, Rfl: 5   benzonatate (TESSALON PERLES) 100 MG capsule, 1-2 capsules up to twice daily as needed for cough, Disp: 30 capsule, Rfl: 0   dorzolamide-timolol (COSOPT) 2-0.5 % ophthalmic solution, SMARTSIG:In Eye(s), Disp: , Rfl:    fluticasone (FLONASE) 50 MCG/ACT nasal  spray, SPRAY 2 SPRAYS INTO EACH NOSTRIL EVERY DAY, Disp: 16 mL, Rfl: 11   gabapentin (NEURONTIN) 300 MG capsule, Take 1 capsule (300 mg total) by mouth 3 (three) times daily., Disp: 30 capsule, Rfl: 0   latanoprost (XALATAN) 0.005 % ophthalmic solution, 1 drop at bedtime., Disp: , Rfl:    magic mouthwash (nystatin, hydrocortisone, diphenhydrAMINE) suspension, Swish and swallow 5 mLs as directed., Disp: 120 mL, Rfl: 0   meloxicam (MOBIC) 15 MG tablet, as needed., Disp: , Rfl:    ondansetron (ZOFRAN) 4 MG tablet, Take 1 tablet (4 mg total) by mouth every 8 (eight) hours as needed for nausea or vomiting., Disp: 60 tablet, Rfl: 2   pantoprazole (PROTONIX) 40 MG tablet, Take 40 mg by mouth daily., Disp: , Rfl:    predniSONE (STERAPRED UNI-PAK 21 TAB) 10 MG (21) TBPK tablet, Take as directed on package, Disp: 21 tablet, Rfl: 0   sucralfate (CARAFATE) 1 GM/10ML suspension, Take 10 mLs (1 g total) by mouth 4 (four) times daily -  with meals and at bedtime., Disp: 420 mL, Rfl: 0   cyclobenzaprine (FLEXERIL) 10 MG tablet, TAKE 1 TABLET BY MOUTH EVERYDAY AT BEDTIME, Disp: 90 tablet, Rfl: 3   folic acid (FOLVITE) 1 MG tablet, Take 1 tablet (1 mg total) by mouth daily., Disp: 90 tablet, Rfl: 3   furosemide (LASIX) 40 MG tablet, Take 1 tablet (40 mg total) by mouth in the morning and at bedtime., Disp: 180 tablet, Rfl: 3   [START ON 11/03/2022] HYDROmorphone (DILAUDID) 4 MG tablet, Take 1 tablet (4 mg total) by mouth every 6 (six) hours as needed for severe pain., Disp: 120 tablet, Rfl: 0   metoprolol tartrate (LOPRESSOR) 50 MG tablet, Take 1 tablet (50 mg total) by mouth 2 (two) times daily., Disp: 180 tablet, Rfl: 3   potassium chloride (KLOR-CON 10) 10 MEQ tablet, Take 1 tablet (10 mEq total) by mouth 2 (two) times daily., Disp: 180 tablet, Rfl: 3   temazepam (RESTORIL) 30 MG capsule, Take 1 capsule (30 mg total) by mouth at bedtime as needed for sleep., Disp: 30 capsule, Rfl: 5  EXAM:  VITALS per patient if  applicable:  GENERAL: alert, oriented, appears well and in no acute distress  HEENT: atraumatic, conjunttiva clear, no obvious abnormalities on inspection of external nose and ears  NECK: normal movements of the head and neck  LUNGS: on inspection no signs of respiratory distress, breathing rate appears normal, no obvious gross SOB, gasping or wheezing  CV: no obvious cyanosis  MS: moves all visible extremities without noticeable abnormality  PSYCH/NEURO: pleasant and cooperative, no obvious depression or anxiety, speech and thought processing grossly intact  ASSESSMENT AND PLAN: Pain management. Indication for chronic opioid: low back pain Medication and dose: Dilaudid 4 mg # pills per month: 120 Last UDS date: 09-19-21 Opioid Treatment Agreement signed (Y/N):  12-19-17 Opioid Treatment Agreement last reviewed with patient:  09-02-22 [facility] reviewed this encounter (include red flags): Yes\ Discussed the following assessment and plan: Meds were refilled.  [name], MD  No diagnosis found.     I discussed the assessment and treatment plan with the patient. The patient was provided an opportunity to ask questions and all were answered. The patient agreed with the plan and demonstrated an understanding of the instructions.   The patient was advised to call back or seek an in-person evaluation if the symptoms worsen or if the condition fails to improve as anticipated.      Review of Systems     Objective:   Physical Exam        Assessment & Plan:

## 2022-09-03 ENCOUNTER — Telehealth: Payer: Self-pay | Admitting: Family Medicine

## 2022-09-03 MED ORDER — HYDROMORPHONE HCL 4 MG PO TABS: 4.0000 mg | ORAL_TABLET | Freq: Four times a day (QID) | ORAL | 0 refills | Status: DC | PRN

## 2022-09-03 NOTE — Telephone Encounter (Signed)
I sent in a one month supply  

## 2022-09-03 NOTE — Telephone Encounter (Signed)
Pt found a pharmacy that has her HYDROmorphone (DILAUDID) 4 MG tablet  [id number] Phone: [phone] Fax: [phone]

## 2022-09-03 NOTE — Telephone Encounter (Signed)
Spoke with pt verbalized understanding 

## 2022-09-05 ENCOUNTER — Encounter: Payer: Self-pay | Admitting: Family Medicine

## 2022-09-05 ENCOUNTER — Ambulatory Visit (INDEPENDENT_AMBULATORY_CARE_PROVIDER_SITE_OTHER): Payer: 59 | Admitting: Family Medicine

## 2022-09-05 ENCOUNTER — Encounter: Payer: Self-pay | Admitting: Physician Assistant

## 2022-09-05 VITALS — BP 132/80 | HR 84 | Temp 98.1°F | Ht 64.0 in | Wt 276.0 lb

## 2022-09-05 DIAGNOSIS — R7401 Elevation of levels of liver transaminase levels: Secondary | ICD-10-CM

## 2022-09-05 DIAGNOSIS — E538 Deficiency of other specified B group vitamins: Secondary | ICD-10-CM | POA: Insufficient documentation

## 2022-09-05 DIAGNOSIS — Z0000 Encounter for general adult medical examination without abnormal findings: Secondary | ICD-10-CM | POA: Diagnosis not present

## 2022-09-05 LAB — LIPID PANEL
Cholesterol: 206 mg/dL — ABNORMAL HIGH (ref 0–200)
HDL: 47 mg/dL (ref >39.00)
LDL Cholesterol: 135 mg/dL — ABNORMAL HIGH (ref 0–99)
NonHDL: 158.88
Total CHOL/HDL Ratio: 4
Triglycerides: 119 mg/dL (ref 0.0–149.0)
VLDL: 23.8 mg/dL (ref 0.0–40.0)

## 2022-09-05 LAB — CBC WITH DIFFERENTIAL/PLATELET
Basophils Absolute: 0 10*3/uL (ref 0.0–0.1)
Basophils Relative: 0.5 % (ref 0.0–3.0)
Eosinophils Absolute: 0.2 10*3/uL (ref 0.0–0.7)
Eosinophils Relative: 2 % (ref 0.0–5.0)
HCT: 40.6 % (ref 36.0–46.0)
Hemoglobin: 13 g/dL (ref 12.0–15.0)
Lymphocytes Relative: 14.1 % (ref 12.0–46.0)
Lymphs Abs: 1.2 10*3/uL (ref 0.7–4.0)
MCHC: 32.1 g/dL (ref 30.0–36.0)
MCV: 97.7 fl (ref 78.0–100.0)
Monocytes Absolute: 0.8 10*3/uL (ref 0.1–1.0)
Monocytes Relative: 9 % (ref 3.0–12.0)
Neutro Abs: 6.4 10*3/uL (ref 1.4–7.7)
Neutrophils Relative %: 74.4 % (ref 43.0–77.0)
Platelets: 238 10*3/uL (ref 150.0–400.0)
RBC: 4.15 Mil/uL (ref 3.87–5.11)
RDW: 15.6 % — ABNORMAL HIGH (ref 11.5–15.5)
WBC: 8.7 10*3/uL (ref 4.0–10.5)

## 2022-09-05 LAB — HEPATIC FUNCTION PANEL
ALT: 109 U/L — ABNORMAL HIGH (ref 0–35)
AST: 213 U/L — ABNORMAL HIGH (ref 0–37)
Albumin: 4.4 g/dL (ref 3.5–5.2)
Alkaline Phosphatase: 77 U/L (ref 39–117)
Bilirubin, Direct: 0.2 mg/dL (ref 0.0–0.3)
Total Bilirubin: 0.6 mg/dL (ref 0.2–1.2)
Total Protein: 7.5 g/dL (ref 6.0–8.3)

## 2022-09-05 LAB — BASIC METABOLIC PANEL
BUN: 11 mg/dL (ref 6–23)
CO2: 27 mEq/L (ref 19–32)
Calcium: 9.2 mg/dL (ref 8.4–10.5)
Chloride: 96 mEq/L (ref 96–112)
Creatinine, Ser: 0.89 mg/dL (ref 0.40–1.20)
GFR: 72.18 mL/min (ref >60.00)
Glucose, Bld: 107 mg/dL — ABNORMAL HIGH (ref 70–99)
Potassium: 3.1 mEq/L — ABNORMAL LOW (ref 3.5–5.1)
Sodium: 140 mEq/L (ref 135–145)

## 2022-09-05 LAB — HEMOGLOBIN A1C: Hgb A1c MFr Bld: 6.1 % (ref 4.6–6.5)

## 2022-09-05 LAB — TSH: TSH: 2.16 u[IU]/mL (ref 0.35–5.50)

## 2022-09-05 LAB — FOLATE: Folate: 5.9 ng/mL — ABNORMAL LOW (ref >5.9)

## 2022-09-05 LAB — VITAMIN B12: Vitamin B-12: 1059 pg/mL — ABNORMAL HIGH (ref 211–911)

## 2022-09-05 MED ORDER — "LUER LOCK SAFETY SYRINGES 25G X 1"" 3 ML MISC": 1.0000 | 5 refills | Status: DC

## 2022-09-05 MED ORDER — GABAPENTIN 300 MG PO CAPS: 300.0000 mg | ORAL_CAPSULE | Freq: Three times a day (TID) | ORAL | 5 refills | Status: DC

## 2022-09-05 MED ORDER — FAMOTIDINE 20 MG PO TABS: 20.0000 mg | ORAL_TABLET | Freq: Two times a day (BID) | ORAL | 5 refills | Status: DC

## 2022-09-05 MED ORDER — PROMETHAZINE HCL 25 MG PO TABS: 25.0000 mg | ORAL_TABLET | ORAL | 5 refills | Status: DC | PRN

## 2022-09-05 MED ORDER — CYANOCOBALAMIN 1000 MCG/ML IJ SOLN: 1000.0000 ug | INTRAMUSCULAR | 11 refills | Status: DC

## 2022-09-05 NOTE — Progress Notes (Signed)
Subjective:    Patient ID: Dana [name], female    DOB: 1966-06-20, 56 y.o.   MRN: [id number]  HPI Here for a well exam. She has a number of issues to discuss. She has dealt with lumbar radiculopathy for years now, and she struggles with daily back pain. She is in a pain management program with [place] for this. She had another ESI to the lumbar spine in August, but this did not help much. She also has neuropathy in both legs and feet, worse on the right side. We found her to be low on folate and B12 a few months ago, and we prescribed B12 shots weekly. However she only came in for 5 shots and then stopped because it is difficult for her to get in here for them. She had tried Gabapentin briefly this past summer, but she did not follow up with this. Also for the past 2 weeks she has had intermittent nausea with vomiting, loss of appetite, and some diarrhea. She is taking Zofran at home but this is not helping. No fever or abdominal pain. She is past due for a colonoscopy.    Review of Systems  Constitutional:  Positive for appetite change. Negative for fever.  HENT: Negative.    Eyes: Negative.   Respiratory: Negative.    Cardiovascular: Negative.   Gastrointestinal:  Positive for diarrhea, nausea and vomiting.  Genitourinary:  Negative for decreased urine volume, difficulty urinating, dyspareunia, dysuria, enuresis, flank pain, frequency, hematuria, pelvic pain and urgency.  Musculoskeletal:  Positive for back pain.  Skin: Negative.   Neurological:  Positive for numbness. Negative for headaches.  Psychiatric/Behavioral: Negative.         Objective:   Physical Exam Constitutional:      General: She is not in acute distress.    Appearance: She is well-developed. She is obese.     Comments: Walks with a cane   HENT:     Head: Normocephalic and atraumatic.     Right Ear: External ear normal.     Left Ear: External ear normal.     Nose: Nose normal.     Mouth/Throat:     Pharynx: No  oropharyngeal exudate.  Eyes:     General: No scleral icterus.    Conjunctiva/sclera: Conjunctivae normal.     Pupils: Pupils are equal, round, and reactive to light.  Neck:     Thyroid: No thyromegaly.     Vascular: No JVD.  Cardiovascular:     Rate and Rhythm: Normal rate and regular rhythm.     Heart sounds: Normal heart sounds. No murmur heard.    No friction rub. No gallop.  Pulmonary:     Effort: Pulmonary effort is normal. No respiratory distress.     Breath sounds: Normal breath sounds. No wheezing or rales.  Chest:     Chest wall: No tenderness.  Abdominal:     General: Bowel sounds are normal. There is no distension.     Palpations: Abdomen is soft. There is no mass.     Tenderness: There is no abdominal tenderness. There is no guarding or rebound.  Musculoskeletal:        General: No tenderness. Normal range of motion.     Cervical back: Normal range of motion and neck supple.  Lymphadenopathy:     Cervical: No cervical adenopathy.  Skin:    General: Skin is warm and dry.     Findings: No erythema or rash.  Neurological:  Mental Status: She is alert and oriented to person, place, and time.     Cranial Nerves: No cranial nerve deficit.     Motor: No abnormal muscle tone.     Coordination: Coordination normal.     Gait: Gait abnormal.     Deep Tendon Reflexes: Reflexes are normal and symmetric. Reflexes normal.  Psychiatric:        Behavior: Behavior normal.        Thought Content: Thought content normal.        Judgment: Judgment normal.           Assessment & Plan:  Well exam. We discussed diet and exercise. Get fasting labs. We will teach her husband to give her B12 shots at home, and he gave her a shot today in our clinic. We will check levels of folate and B12 with the labs today. She seems to have a GI virus of some sort, and she can use Phenergan as needed. She will also start on Pepcid 20 mg BID. For the neuropathy, she will get back on Gabapentin  300 mg TID. We will have the GI office contact her about a colonoscopy. [name], MD

## 2022-09-06 NOTE — Addendum Note (Signed)
Addended by: [name] A on: 09/06/2022 12:41 PM   Modules accepted: Orders

## 2022-09-11 ENCOUNTER — Other Ambulatory Visit: Payer: 59

## 2022-09-12 ENCOUNTER — Other Ambulatory Visit: Payer: 59

## 2022-09-12 DIAGNOSIS — R7401 Elevation of levels of liver transaminase levels: Secondary | ICD-10-CM

## 2022-09-13 LAB — HEPATITIS A ANTIBODY, IGM: Hep A IgM: NONREACTIVE

## 2022-09-13 LAB — HEPATITIS B SURFACE ANTIGEN: Hepatitis B Surface Ag: NONREACTIVE

## 2022-09-13 LAB — EPSTEIN-BARR VIRUS VCA, IGM: EBV VCA IgM: <36 U/mL

## 2022-09-13 LAB — HEPATITIS C ANTIBODY: Hepatitis C Ab: NONREACTIVE

## 2022-09-13 LAB — HIV ANTIBODY (ROUTINE TESTING W REFLEX): HIV 1&2 Ab, 4th Generation: NONREACTIVE

## 2022-09-13 LAB — CMV IGM: CMV IgM: <30 AU/mL

## 2022-09-18 ENCOUNTER — Telehealth: Payer: Self-pay | Admitting: Family Medicine

## 2022-09-18 NOTE — Telephone Encounter (Signed)
Pt called demanding a call back from the MD. Pt refused to tell me the reason.  Pt was informed that MD was with patients and I could have the [place] call her back.  Pt stated if MD can't talk to her, she will call EMS.  Pt was informed that if this was an emergency, I could transfer her to the Triage Nurse.  Pt screamed into the phone:  NO, I DO NOT WANT TO SPEAK TO ANYONE EXCEPT MY DOCTOR!!  TELL HIM TO CALL ME BACK NOW!!

## 2022-09-19 NOTE — Telephone Encounter (Signed)
Pt is returning nancy call

## 2022-09-19 NOTE — Telephone Encounter (Signed)
Called pt left detailed message advising to call the office back

## 2022-09-19 NOTE — Telephone Encounter (Signed)
Please contact her and see what she needs

## 2022-09-20 NOTE — Telephone Encounter (Signed)
  FYI Called pt advised to schedule f/u appointment with Dr Sarajane Jews for the legs and hands pain, pt husband stated that pt is refusing to eat because she is afraid to vomit and pt husband thinks that is contributing to pt problems. Pt was advised to seek emergency help if she gets worse over the weekend. Pt is scheduled for office visit Monday 09/23/21, pt verbalized understanding

## 2022-09-23 ENCOUNTER — Inpatient Hospital Stay (HOSPITAL_COMMUNITY)
Admission: EM | Admit: 2022-09-23 | Discharge: 2022-10-02 | DRG: 095 | Disposition: A | Discharge status: Rehabilitation Facility | Payer: 59 | Attending: Internal Medicine | Admitting: Internal Medicine

## 2022-09-23 ENCOUNTER — Emergency Department (HOSPITAL_COMMUNITY): Payer: 59

## 2022-09-23 ENCOUNTER — Other Ambulatory Visit: Payer: Self-pay

## 2022-09-23 ENCOUNTER — Encounter (HOSPITAL_COMMUNITY): Payer: Self-pay

## 2022-09-23 ENCOUNTER — Ambulatory Visit: Payer: 59 | Admitting: Family Medicine

## 2022-09-23 DIAGNOSIS — Z79891 Long term (current) use of opiate analgesic: Secondary | ICD-10-CM

## 2022-09-23 DIAGNOSIS — Z981 Arthrodesis status: Secondary | ICD-10-CM

## 2022-09-23 DIAGNOSIS — E876 Hypokalemia: Secondary | ICD-10-CM | POA: Diagnosis present

## 2022-09-23 DIAGNOSIS — Z833 Family history of diabetes mellitus: Secondary | ICD-10-CM

## 2022-09-23 DIAGNOSIS — B9620 Unspecified Escherichia coli [E. coli] as the cause of diseases classified elsewhere: Secondary | ICD-10-CM | POA: Diagnosis present

## 2022-09-23 DIAGNOSIS — Z87891 Personal history of nicotine dependence: Secondary | ICD-10-CM

## 2022-09-23 DIAGNOSIS — M5440 Lumbago with sciatica, unspecified side: Secondary | ICD-10-CM | POA: Diagnosis present

## 2022-09-23 DIAGNOSIS — N3000 Acute cystitis without hematuria: Secondary | ICD-10-CM

## 2022-09-23 DIAGNOSIS — Z79899 Other long term (current) drug therapy: Secondary | ICD-10-CM

## 2022-09-23 DIAGNOSIS — Z1152 Encounter for screening for COVID-19: Secondary | ICD-10-CM

## 2022-09-23 DIAGNOSIS — E6609 Other obesity due to excess calories: Secondary | ICD-10-CM

## 2022-09-23 DIAGNOSIS — G8929 Other chronic pain: Secondary | ICD-10-CM | POA: Diagnosis not present

## 2022-09-23 DIAGNOSIS — K219 Gastro-esophageal reflux disease without esophagitis: Secondary | ICD-10-CM | POA: Diagnosis not present

## 2022-09-23 DIAGNOSIS — Z888 Allergy status to other drugs, medicaments and biological substances status: Secondary | ICD-10-CM

## 2022-09-23 DIAGNOSIS — I10 Essential (primary) hypertension: Secondary | ICD-10-CM | POA: Diagnosis present

## 2022-09-23 DIAGNOSIS — R262 Difficulty in walking, not elsewhere classified: Secondary | ICD-10-CM | POA: Diagnosis not present

## 2022-09-23 DIAGNOSIS — G610 Guillain-Barre syndrome: Principal | ICD-10-CM

## 2022-09-23 DIAGNOSIS — M5416 Radiculopathy, lumbar region: Secondary | ICD-10-CM | POA: Diagnosis not present

## 2022-09-23 DIAGNOSIS — M4804 Spinal stenosis, thoracic region: Secondary | ICD-10-CM | POA: Diagnosis present

## 2022-09-23 DIAGNOSIS — M4802 Spinal stenosis, cervical region: Secondary | ICD-10-CM | POA: Diagnosis not present

## 2022-09-23 DIAGNOSIS — K589 Irritable bowel syndrome without diarrhea: Secondary | ICD-10-CM | POA: Diagnosis present

## 2022-09-23 DIAGNOSIS — R112 Nausea with vomiting, unspecified: Secondary | ICD-10-CM | POA: Diagnosis present

## 2022-09-23 DIAGNOSIS — E7800 Pure hypercholesterolemia, unspecified: Secondary | ICD-10-CM | POA: Diagnosis not present

## 2022-09-23 DIAGNOSIS — F419 Anxiety disorder, unspecified: Secondary | ICD-10-CM | POA: Diagnosis present

## 2022-09-23 DIAGNOSIS — Z880 Allergy status to penicillin: Secondary | ICD-10-CM

## 2022-09-23 DIAGNOSIS — M47814 Spondylosis without myelopathy or radiculopathy, thoracic region: Secondary | ICD-10-CM | POA: Diagnosis not present

## 2022-09-23 DIAGNOSIS — Z993 Dependence on wheelchair: Secondary | ICD-10-CM

## 2022-09-23 DIAGNOSIS — E66813 Obesity, class 3: Secondary | ICD-10-CM | POA: Diagnosis present

## 2022-09-23 DIAGNOSIS — Z885 Allergy status to narcotic agent status: Secondary | ICD-10-CM

## 2022-09-23 DIAGNOSIS — E538 Deficiency of other specified B group vitamins: Secondary | ICD-10-CM | POA: Diagnosis present

## 2022-09-23 DIAGNOSIS — R519 Headache, unspecified: Secondary | ICD-10-CM | POA: Diagnosis present

## 2022-09-23 DIAGNOSIS — E882 Lipomatosis, not elsewhere classified: Secondary | ICD-10-CM | POA: Diagnosis present

## 2022-09-23 DIAGNOSIS — M797 Fibromyalgia: Secondary | ICD-10-CM | POA: Diagnosis present

## 2022-09-23 DIAGNOSIS — Z803 Family history of malignant neoplasm of breast: Secondary | ICD-10-CM

## 2022-09-23 DIAGNOSIS — R531 Weakness: Secondary | ICD-10-CM | POA: Diagnosis not present

## 2022-09-23 DIAGNOSIS — F1120 Opioid dependence, uncomplicated: Secondary | ICD-10-CM | POA: Diagnosis present

## 2022-09-23 DIAGNOSIS — Z6841 Body Mass Index (BMI) 40.0 and over, adult: Secondary | ICD-10-CM | POA: Diagnosis not present

## 2022-09-23 DIAGNOSIS — N390 Urinary tract infection, site not specified: Secondary | ICD-10-CM | POA: Diagnosis not present

## 2022-09-23 DIAGNOSIS — Z886 Allergy status to analgesic agent status: Secondary | ICD-10-CM

## 2022-09-23 DIAGNOSIS — R5381 Other malaise: Secondary | ICD-10-CM | POA: Diagnosis not present

## 2022-09-23 DIAGNOSIS — M48061 Spinal stenosis, lumbar region without neurogenic claudication: Secondary | ICD-10-CM | POA: Diagnosis present

## 2022-09-23 DIAGNOSIS — R338 Other retention of urine: Secondary | ICD-10-CM | POA: Diagnosis present

## 2022-09-23 DIAGNOSIS — E739 Lactose intolerance, unspecified: Secondary | ICD-10-CM | POA: Diagnosis present

## 2022-09-23 DIAGNOSIS — E669 Obesity, unspecified: Secondary | ICD-10-CM | POA: Diagnosis present

## 2022-09-23 DIAGNOSIS — B961 Klebsiella pneumoniae [K. pneumoniae] as the cause of diseases classified elsewhere: Secondary | ICD-10-CM | POA: Diagnosis present

## 2022-09-23 DIAGNOSIS — Z90710 Acquired absence of both cervix and uterus: Secondary | ICD-10-CM

## 2022-09-23 LAB — COMPREHENSIVE METABOLIC PANEL
ALT: 19 U/L (ref 0–44)
AST: 33 U/L (ref 15–41)
Albumin: 3.1 g/dL — ABNORMAL LOW (ref 3.5–5.0)
Alkaline Phosphatase: 79 U/L (ref 38–126)
Anion gap: 16 — ABNORMAL HIGH (ref 5–15)
BUN: 10 mg/dL (ref 6–20)
CO2: 25 mmol/L (ref 22–32)
Calcium: 8.7 mg/dL — ABNORMAL LOW (ref 8.9–10.3)
Chloride: 98 mmol/L (ref 98–111)
Creatinine, Ser: 0.86 mg/dL (ref 0.44–1.00)
GFR, Estimated: >60 mL/min (ref >60)
Glucose, Bld: 115 mg/dL — ABNORMAL HIGH (ref 70–99)
Potassium: 3.1 mmol/L — ABNORMAL LOW (ref 3.5–5.1)
Sodium: 139 mmol/L (ref 135–145)
Total Bilirubin: 0.9 mg/dL (ref 0.3–1.2)
Total Protein: 6.8 g/dL (ref 6.5–8.1)

## 2022-09-23 LAB — CBC WITH DIFFERENTIAL/PLATELET
Abs Immature Granulocytes: 0.04 10*3/uL (ref 0.00–0.07)
Basophils Absolute: 0.1 10*3/uL (ref 0.0–0.1)
Basophils Relative: 1 %
Eosinophils Absolute: 0.4 10*3/uL (ref 0.0–0.5)
Eosinophils Relative: 3 %
HCT: 41.5 % (ref 36.0–46.0)
Hemoglobin: 13.1 g/dL (ref 12.0–15.0)
Immature Granulocytes: 0 %
Lymphocytes Relative: 18 %
Lymphs Abs: 2 10*3/uL (ref 0.7–4.0)
MCH: 29.6 pg (ref 26.0–34.0)
MCHC: 31.6 g/dL (ref 30.0–36.0)
MCV: 93.7 fL (ref 80.0–100.0)
Monocytes Absolute: 0.6 10*3/uL (ref 0.1–1.0)
Monocytes Relative: 5 %
Neutro Abs: 7.9 10*3/uL — ABNORMAL HIGH (ref 1.7–7.7)
Neutrophils Relative %: 73 %
Platelets: 395 10*3/uL (ref 150–400)
RBC: 4.43 MIL/uL (ref 3.87–5.11)
RDW: 15.1 % (ref 11.5–15.5)
WBC: 10.9 10*3/uL — ABNORMAL HIGH (ref 4.0–10.5)
nRBC: 0 % (ref 0.0–0.2)

## 2022-09-23 LAB — URINALYSIS, ROUTINE W REFLEX MICROSCOPIC
Bilirubin Urine: NEGATIVE
Glucose, UA: NEGATIVE mg/dL
Hgb urine dipstick: NEGATIVE
Ketones, ur: 5 mg/dL — AB
Nitrite: POSITIVE — AB
Protein, ur: 30 mg/dL — AB
Specific Gravity, Urine: 1.024 (ref 1.005–1.030)
Squamous Epithelial / HPF: >50 /HPF — ABNORMAL HIGH (ref 0–5)
pH: 5 (ref 5.0–8.0)

## 2022-09-23 LAB — RESP PANEL BY RT-PCR (RSV, FLU A&B, COVID)  RVPGX2
Influenza A by PCR: NEGATIVE
Influenza B by PCR: NEGATIVE
Resp Syncytial Virus by PCR: NEGATIVE
SARS Coronavirus 2 by RT PCR: NEGATIVE

## 2022-09-23 LAB — PROTIME-INR
INR: 1 (ref 0.8–1.2)
Prothrombin Time: 12.7 seconds (ref 11.4–15.2)

## 2022-09-23 MED ORDER — GABAPENTIN 300 MG PO CAPS
300.0000 mg | ORAL_CAPSULE | Freq: Three times a day (TID) | ORAL | Status: DC
  Administered 2022-09-24 – 2022-09-28 (×14): 300 mg via ORAL
  Filled 2022-09-23 (×14): qty 1

## 2022-09-23 MED ORDER — SODIUM CHLORIDE 0.9 % IV SOLN: INTRAVENOUS | Status: AC

## 2022-09-23 MED ORDER — ALBUTEROL SULFATE HFA 108 (90 BASE) MCG/ACT IN AERS: 2.0000 | INHALATION_SPRAY | Freq: Four times a day (QID) | RESPIRATORY_TRACT | Status: DC | PRN

## 2022-09-23 MED ORDER — SODIUM CHLORIDE 0.9 % IV BOLUS
1000.0000 mL | Freq: Once | INTRAVENOUS | Status: AC
  Administered 2022-09-23: 1000 mL via INTRAVENOUS

## 2022-09-23 MED ORDER — LORAZEPAM 2 MG/ML IJ SOLN
2.0000 mg | Freq: Once | INTRAMUSCULAR | Status: AC
  Administered 2022-09-23: 2 mg via INTRAVENOUS
  Filled 2022-09-23: qty 1

## 2022-09-23 MED ORDER — FOLIC ACID 1 MG PO TABS
1.0000 mg | ORAL_TABLET | Freq: Every day | ORAL | Status: DC
  Administered 2022-09-24 – 2022-10-02 (×9): 1 mg via ORAL
  Filled 2022-09-23 (×9): qty 1

## 2022-09-23 MED ORDER — ALBUTEROL SULFATE (2.5 MG/3ML) 0.083% IN NEBU: 3.0000 mL | INHALATION_SOLUTION | Freq: Four times a day (QID) | RESPIRATORY_TRACT | Status: DC | PRN

## 2022-09-23 MED ORDER — GADOBUTROL 1 MMOL/ML IV SOLN
10.0000 mL | Freq: Once | INTRAVENOUS | Status: AC | PRN
  Administered 2022-09-23: 10 mL via INTRAVENOUS

## 2022-09-23 MED ORDER — POTASSIUM CHLORIDE CRYS ER 20 MEQ PO TBCR
40.0000 meq | EXTENDED_RELEASE_TABLET | Freq: Once | ORAL | Status: AC
  Administered 2022-09-23: 40 meq via ORAL
  Filled 2022-09-23: qty 2

## 2022-09-23 MED ORDER — CYCLOBENZAPRINE HCL 10 MG PO TABS
10.0000 mg | ORAL_TABLET | Freq: Three times a day (TID) | ORAL | Status: DC | PRN
  Administered 2022-09-24 – 2022-10-01 (×6): 10 mg via ORAL
  Filled 2022-09-23 (×6): qty 1

## 2022-09-23 MED ORDER — PANTOPRAZOLE SODIUM 40 MG PO TBEC
40.0000 mg | DELAYED_RELEASE_TABLET | Freq: Every day | ORAL | Status: DC
  Administered 2022-09-24 – 2022-10-02 (×9): 40 mg via ORAL
  Filled 2022-09-23 (×9): qty 1

## 2022-09-23 MED ORDER — LATANOPROST 0.005 % OP SOLN
1.0000 [drp] | Freq: Every day | OPHTHALMIC | Status: DC
  Administered 2022-09-26 – 2022-10-01 (×7): 1 [drp] via OPHTHALMIC
  Filled 2022-09-23 (×2): qty 2.5

## 2022-09-23 MED ORDER — HYDROMORPHONE HCL 2 MG PO TABS
4.0000 mg | ORAL_TABLET | Freq: Four times a day (QID) | ORAL | Status: DC | PRN
  Administered 2022-09-24 – 2022-10-01 (×16): 4 mg via ORAL
  Filled 2022-09-23 (×17): qty 2

## 2022-09-23 MED ORDER — TEMAZEPAM 30 MG PO CAPS: 30.0000 mg | ORAL_CAPSULE | Freq: Every evening | ORAL | Status: DC | PRN

## 2022-09-23 NOTE — Assessment & Plan Note (Signed)
Allow permissive hypertension 

## 2022-09-23 NOTE — ED Triage Notes (Addendum)
Per EMS- Patient is from home. Patient c/o weakness and numbness "all over" since 08/23/22 and states it is worsening.   Patient reports when she picks objects up she drops them, numbness of the entire neck and lips. Patient states she has decreased mobility due to the numbness.  Patient also reports that she is unable to bear weight on her lower extremities and fell last night. Patient states she fell on her buttocks and did not hit her head.

## 2022-09-23 NOTE — ED Provider Notes (Signed)
[facility] DEPT Provider Note   CSN: [id number] Arrival date & time: 09/23/22  1213     History  Chief Complaint  Patient presents with   Weakness   Numbness   Fall    Dana [name] is a 57 y.o. female.  Patient is a 73 old patient with a history of IBS, fibromyalgia, hypertension, hyperlipidemia, chronic back pain presents with general weakness and falls.  She states that December age she started having nausea and vomiting.  She also had some diarrhea.  The diarrhea resolved but the vomiting had continued.  Her doctor prescribed Zofran which did not work very well.  He then prescribed a second medication which did work better.  She is able to tolerate some liquid type stuff currently.  She denies any abdominal pain.  She states over the last 2 to 3 days she has had some increased mobility issues.  She started having some weakness in her feet and then progressed up to her legs and now her arms.  She says she even feels like her neck is weak.  She started stumbling and has had 2 falls.  Today she was not able to ambulate at all.  She does feel numbness and tingling in all of her extremities.  No shortness of breath.  No fevers.  No cough or cold symptoms.  No headache.  No current neck or back pain.  She denies any injuries from the fall.  No head injury.       Home Medications Prior to Admission medications   Medication Sig Start Date End Date Taking? Authorizing Provider  albuterol (VENTOLIN HFA) 108 (90 Base) MCG/ACT inhaler Inhale 2 puffs into the lungs every 6 (six) hours as needed for wheezing or shortness of breath. 11/27/21   Lucretia Kern, DO  ALPRAZolam (XANAX) 0.5 MG tablet TAKE 1 TABLET (0.5 MG TOTAL) BY MOUTH 3 (THREE) TIMES DAILY AS NEEDED FOR ANXIETY. 06/20/22   [name], MD  benzonatate (TESSALON PERLES) 100 MG capsule 1-2 capsules up to twice daily as needed for cough 07/04/22   Lucretia Kern, DO  cyanocobalamin (VITAMIN B12) 1000  MCG/ML injection Inject 1 mL (1,000 mcg total) into the muscle once a week. 09/05/22   [name], MD  cyclobenzaprine (FLEXERIL) 10 MG tablet TAKE 1 TABLET BY MOUTH EVERYDAY AT BEDTIME 09/02/22   [name], MD  dorzolamide-timolol (COSOPT) 2-0.5 % ophthalmic solution SMARTSIG:In Eye(s) 05/29/22   [provider]  famotidine (PEPCID) 20 MG tablet Take 1 tablet (20 mg total) by mouth 2 (two) times daily. 09/05/22   [name], MD  fluticasone [facility]) 50 MCG/ACT nasal spray SPRAY 2 SPRAYS INTO EACH NOSTRIL EVERY DAY 01/10/22   [name], MD  folic acid (FOLVITE) 1 MG tablet Take 1 tablet (1 mg total) by mouth daily. 09/02/22   [name], MD  furosemide (LASIX) 40 MG tablet Take 1 tablet (40 mg total) by mouth in the morning and at bedtime. 09/02/22   [name], MD  gabapentin (NEURONTIN) 300 MG capsule Take 1 capsule (300 mg total) by mouth 3 (three) times daily. 09/05/22   [name], MD  HYDROmorphone (DILAUDID) 4 MG tablet Take 1 tablet (4 mg total) by mouth every 6 (six) hours as needed for severe pain. 09/03/22 10/03/22  [name], MD  latanoprost (XALATAN) 0.005 % ophthalmic solution 1 drop at bedtime. 12/13/21   [provider]  magic mouthwash (  nystatin, hydrocortisone, diphenhydrAMINE) suspension Swish and swallow 5 mLs as directed. 11/15/21   [name], MD  meloxicam (MOBIC) 15 MG tablet as needed.    [provider]  metoprolol tartrate (LOPRESSOR) 50 MG tablet Take 1 tablet (50 mg total) by mouth 2 (two) times daily. 09/02/22   [name], MD  ondansetron (ZOFRAN) 4 MG tablet Take 1 tablet (4 mg total) by mouth every 8 (eight) hours as needed for nausea or vomiting. 12/25/21   [name], MD  pantoprazole (PROTONIX) 40 MG tablet Take 40 mg by mouth daily. 12/10/21   [provider]  potassium chloride (KLOR-CON 10) 10 MEQ tablet Take 1 tablet (10 mEq total) by mouth 2 (two) times daily. 09/02/22   [name],  MD  predniSONE (STERAPRED UNI-PAK 21 TAB) 10 MG (21) TBPK tablet Take as directed on package 07/13/22   Magnant, Gerrianne Scale, PA-C  promethazine (PHENERGAN) 25 MG tablet Take 1 tablet (25 mg total) by mouth every 4 (four) hours as needed for nausea or vomiting. 09/05/22   [name], MD  sucralfate (CARAFATE) 1 GM/10ML suspension Take 10 mLs (1 g total) by mouth 4 (four) times daily -  with meals and at bedtime. 07/26/21   Nafziger, Tommi Rumps, NP  SYRINGE-NEEDLE, DISP, 3 ML (LUER LOCK SAFETY SYRINGES) 25G X 1" 3 ML MISC 1 Application by Does not apply route once a week. 09/05/22   [name], MD  temazepam (RESTORIL) 30 MG capsule Take 1 capsule (30 mg total) by mouth at bedtime as needed for sleep. 09/02/22   [name], MD      Allergies    Lisinopril, Codeine, Hydrocodone, Penicillins, Amlodipine, and Acetaminophen    Review of Systems   Review of Systems  Constitutional:  Positive for fatigue. Negative for chills, diaphoresis and fever.  HENT:  Negative for congestion, rhinorrhea and sneezing.   Eyes: Negative.   Respiratory:  Negative for cough, chest tightness and shortness of breath.   Cardiovascular:  Negative for chest pain and leg swelling.  Gastrointestinal:  Positive for nausea and vomiting. Negative for abdominal pain, blood in stool and diarrhea.  Genitourinary:  Negative for difficulty urinating, flank pain, frequency and hematuria.  Musculoskeletal:  Negative for arthralgias and back pain.  Skin:  Negative for rash.  Neurological:  Positive for weakness and numbness. Negative for dizziness, speech difficulty and headaches.    Physical Exam Updated Vital Signs BP (!) 166/104   Pulse 93   Temp 98.6 F (37 C) (Oral)   Resp 19   Ht '5\' 4"'$  (1.626 m)   Wt 125.2 kg   LMP 08/17/2012   SpO2 100%   BMI 47.38 kg/m  Physical Exam Constitutional:      Appearance: She is well-developed.  HENT:     Head: Normocephalic and atraumatic.  Eyes:     Pupils: Pupils are  equal, round, and reactive to light.  Cardiovascular:     Rate and Rhythm: Normal rate and regular rhythm.     Heart sounds: Normal heart sounds.  Pulmonary:     Effort: Pulmonary effort is normal. No respiratory distress.     Breath sounds: Normal breath sounds. No wheezing or rales.  Chest:     Chest wall: No tenderness.  Abdominal:     General: Bowel sounds are normal.     Palpations: Abdomen is soft.     Tenderness: There is no abdominal tenderness. There is no guarding or rebound.  Musculoskeletal:        General: Normal range of motion.     Cervical back: Normal range of motion and neck supple.  Lymphadenopathy:     Cervical: No cervical adenopathy.  Skin:    General: Skin is warm and dry.     Findings: No rash.  Neurological:     Mental Status: She is alert and oriented to person, place, and time.     Comments: Patient has 5 out of 5 strength in the upper extremities bilaterally.  No pronator drift.  4 out of 5 strength in the lower extremities.  She is able to mildly raise her legs off the bed.  She has intact sensation to light touch in all extremities although she reports that her left leg is more numb than her right leg although she feels numbness in all extremities.  Radial nerves II through XII grossly intact.  Patellar reflexes seem diminished but hard to completely evaluate due to her size.     ED Results / Procedures / Treatments   Labs (all labs ordered are listed, but only abnormal results are displayed) Labs Reviewed  COMPREHENSIVE METABOLIC PANEL - Abnormal; Notable for the following components:      Result Value   Potassium 3.1 (*)    Glucose, Bld 115 (*)    Calcium 8.7 (*)    Albumin 3.1 (*)    Anion gap 16 (*)    All other components within normal limits  CBC WITH DIFFERENTIAL/PLATELET - Abnormal; Notable for the following components:   WBC 10.9 (*)    Neutro Abs 7.9 (*)    All other components within normal limits  RESP PANEL BY RT-PCR (RSV, FLU A&B,  COVID)  [id number]  URINALYSIS, ROUTINE W REFLEX MICROSCOPIC    EKG EKG Interpretation  Date/Time:  Monday September 23 2022 13:24:07 EST Ventricular Rate:  96 PR Interval:  138 QRS Duration: 78 QT Interval:  435 QTC Calculation: 550 R Axis:   67 Text Interpretation: Sinus rhythm Borderline repolarization abnormality Prolonged QT interval Confirmed by Malvin Johns [phone]) on 09/23/2022 3:02:28 PM  Radiology CT Head Wo Contrast  Result Date: 09/23/2022 CLINICAL DATA:  Headache EXAM: CT HEAD WITHOUT CONTRAST TECHNIQUE: Contiguous axial images were obtained from the base of the skull through the vertex without intravenous contrast. RADIATION DOSE REDUCTION: This exam was performed according to the departmental dose-optimization program which includes automated exposure control, adjustment of the mA and/or kV according to patient size and/or use of iterative reconstruction technique. COMPARISON:  None Available. FINDINGS: Brain: No evidence of acute infarction, hemorrhage, hydrocephalus, extra-axial collection or mass lesion/mass effect. Partially empty sella. Vascular: No hyperdense vessel or unexpected calcification. Skull: Normal. Negative for fracture or focal lesion. Sinuses/Orbits: No acute finding. Other: None. IMPRESSION: No specific etiology for headaches identified. Electronically Signed   By: Marin Roberts M.D.   On: 09/23/2022 17:01   DG Chest Portable 1 View  Result Date: 09/23/2022 CLINICAL DATA:  Numbness all over.  Weakness. EXAM: PORTABLE CHEST 1 VIEW COMPARISON:  04/23/2018. FINDINGS: Normal heart, mediastinum and hila. Clear lungs.  No pleural effusion or pneumothorax. Skeletal structures are grossly intact. Stable changes from a previous anterior cervical spine fusion. IMPRESSION: No active disease. Electronically Signed   By: Lajean Manes M.D.   On: 09/23/2022 13:22    Procedures Procedures    Medications Ordered in ED Medications  potassium chloride SA (KLOR-CON M) CR tablet  40 mEq (40 mEq Oral Given 09/23/22 1626)  sodium  chloride 0.9 % bolus 1,000 mL (1,000 mLs Intravenous New Bag/Given 09/23/22 1626)    ED Course/ Medical Decision Making/ A&P                           Medical Decision Making Amount and/or Complexity of Data Reviewed Radiology: ordered.  Risk Prescription drug management.   Patient presents with diffuse weakness.  She initially had a viral type illness presenting with nausea and vomiting that started December 8.  Those symptoms have been improving and she is able to start drinking a little bit although not eating a lot.  However over the last 2 to 3 days she had progressive weakness with stumbling.  She feels like it started in her legs and now is in her arms where she is dropping things.  Labs show a mild hypokalemia.  Her potassium was replaced here in the ED.  She was given IV fluids without significant improvement in symptoms.  I spoke with Dr. Leonel Ramsay with neurology.  He recommends starting by getting patient MRIs of the spine to rule out any cord compression.  Unfortunately our MRI machine is out of order here at [facility].  Will plan on doing an ED to ED transfer.  I spoke with Dr. Posey Pronto with the hospitalist service.  He advises to let the [facility] hospitalist team know about the patient once the patient is in the [facility], ED.  Dr. Maylon Peppers to accept pt to transfer to [facility] ED.  Final Clinical Impression(s) / ED Diagnoses Final diagnoses:  Weakness  Hypokalemia    Rx / DC Orders ED Discharge Orders     None         Malvin Johns, MD 09/23/22 0315

## 2022-09-23 NOTE — ED Triage Notes (Signed)
Pt via [facility] from [facility] for MRI. She has had trouble walking recently with global weakness. A/O x 4  BP >432 systolic

## 2022-09-23 NOTE — ED Notes (Signed)
To exam room from triage by w/c. Pt anxious about and "unable to" transfer from w/c to stretcher "d/t general profound weakness". 4 staff assist from w/c to stretcher w/o incident.

## 2022-09-23 NOTE — ED Provider Triage Note (Signed)
Emergency Medicine Provider Triage Evaluation Note  Dana [name] , a 57 y.o. female  was evaluated in triage.  Pt complains of weakness and numbness.  Patient reports that because of her severe weakness she had a fall but did not report any significant pain to any part of her body or any head strike.  Patient not currently on blood thinners.  Patient was being worked up by primary care provider for possible cause of this weakness this has not been present for approximately 1 month without any significant improvement or resolution but also no considerable deterioration in symptoms.  No recent medication changes, infections, immunizations.  Review of Systems  Positive: As above Negative: As above  Physical Exam  BP (!) 164/111 (BP Location: Left Arm)   Pulse (!) 102   Temp 98.5 F (36.9 C) (Oral)   Resp 18   Ht '5\' 4"'$  (1.626 m)   Wt 125.2 kg   LMP 08/17/2012   SpO2 96%   BMI 47.38 kg/m  Gen:   Awake, no distress Resp:  Normal effort clear to auscultation bilaterally MSK:   Moves extremities with difficulty, more noticeable weakness in lower extremities when compared to upper extremities Other:    Medical Decision Making  Medically screening exam initiated at 1:10 PM.  Appropriate orders placed.  Dana [name] was informed that the remainder of the evaluation will be completed by another provider, this initial triage assessment does not replace that evaluation, and the importance of remaining in the ED until their evaluation is complete.     Luvenia Heller, PA-C 09/23/22 1312

## 2022-09-23 NOTE — ED Notes (Signed)
Patient transported to MRI 

## 2022-09-23 NOTE — ED Notes (Addendum)
EDP at [facility]. D/w pt transfer to [facility] for MRI and admission. Pt agreeable. H/o HTN. Did not have meds today. IV remains patent, and positional, angiocath will not advance to hub.

## 2022-09-23 NOTE — Subjective & Objective (Signed)
Has been having weakness and numbness all over since December but is getting worse Reports numbness in neck and lips Decreased mobility secondary to then Unable to bear weight on lower extremities and had a fall although did not hit her head Reports this is started in December when she had some nausea vomiting and diarrhea diarrhea resolved but vomiting has continued her doctor has given some prescription for Zofran and more nausea medicine but she continued to have some diarrhea and nausea.  Now for the past 2 to 3 days she has been having difficulty ambulating.  She is not short of breath no fevers no chills no cough no headache

## 2022-09-23 NOTE — Assessment & Plan Note (Signed)
Will replace and recheck check other electrolytes

## 2022-09-23 NOTE — Assessment & Plan Note (Addendum)
Will have PT OT assess and evaluate.  This is progressive and ongoing associated with numbness has been followed as an outpatient and had her B12 replaced.  MRI of cervical thoracic and lumbar spine ordered.  To evaluate for any neurological abnormalities.  Treat as needed.  Neurology is aware.  Will need possible placement as patient now unable to ambulate. Check B12 folate CRP sed rate aldolase TSH CK

## 2022-09-23 NOTE — Assessment & Plan Note (Signed)
Chronic will need outpatient nutritional consult and follow-up

## 2022-09-23 NOTE — ED Notes (Signed)
Pending transfer to MRI at [facility]. PT upset. EDP into speak with pt. [facility] at room, on standby.

## 2022-09-23 NOTE — ED Notes (Signed)
ED PA at [facility] 

## 2022-09-23 NOTE — H&P (Signed)
Dana [name] YQI:[id number] DOB: 10-27-1965 DOA: 09/23/2022     PCP: [name], MD   Outpatient Specialists: * NONE CARDS: * Dr. NEphrology: *  Dr. NEurology *   Dr. Pulmonary *  Dr.  Oncology * Dr. Fabienne Bruns* Dr.  Sadie Haber, [facility]) No care team member to display Urology Dr. *  Patient arrived to ER on 09/23/22 at 75 Referred by Attending Malvin Johns, MD   Patient coming from:    home Lives alone,   *** With family    Chief Complaint:   Chief Complaint  Patient presents with   Weakness   Numbness   Fall    HPI: Dana [name] is a 57 y.o. female with medical history significant of IBS, fibromyalgia hypertension hyperlipidemia chronic back pain  Presented with   generalized weakness Has been having weakness and numbness all over since December but is getting worse Reports numbness in neck and lips Decreased mobility secondary to then Unable to bear weight on lower extremities and had a fall although did not hit her head Reports this is started in December when she had some nausea vomiting and diarrhea diarrhea resolved but vomiting has continued her doctor has given some prescription for Zofran and more nausea medicine but she continued to have some diarrhea and nausea.  Now for the past 2 to 3 days she has been having difficulty ambulating.  She is not short of breath no fevers no chills no cough no headache   Pt now states she has been having numbness of her lower extremities for the past year and has been followed by Dr. Maxie Better, getting shots, taking NEurontin and B12 shots   Initial COVID TEST  NEGATIVE   Lab Results  Component Value Date   [place] 09/23/2022   [place] NEGATIVE 05/10/2019   [place] NEGATIVE 05/04/2019   [place] Not Detected 02/14/2019     Regarding pertinent Chronic problems:    ****Hyperlipidemia - *on statins {statin:315258}  Lipid Panel     Component Value Date/Time   CHOL 206 (H) 09/05/2022 1132   TRIG 119.0  09/05/2022 1132   HDL 47.00 09/05/2022 1132   CHOLHDL 4 09/05/2022 1132   VLDL 23.8 09/05/2022 1132   LDLCALC 135 (H) 09/05/2022 1132   LDLDIRECT 197.7 09/13/2013 1201     HTN on metoprolol On lasix ***chronic CHF diastolic/systolic/ combined - last echo***  *** CAD  - On Aspirin, statin, betablocker, Plavix                 - *followed by cardiology                - last cardiac cath  The 10-year ASCVD risk score (Arnett DK, et al., 2019) is: 4.2%   Values used to calculate the score:     Age: 37 years     Sex: Female     Is Non-Hispanic African American: Yes     Diabetic: No     Tobacco smoker: No     Systolic Blood Pressure: 387 mmHg     Is BP treated: Yes     HDL Cholesterol: 47 mg/dL     Total Cholesterol: 206 mg/dL      Morbid obesity-   BMI Readings from Last 1 Encounters:  09/23/22 47.38 kg/m     *** Asthma -well *** controlled on home inhalers/ nebs f                        ***  last no prior***admission  ***                       No ***history of intubation  *** COPD - not **followed by pulmonology *** not  on baseline oxygen  *L,    *** OSA -on nocturnal oxygen, *CPAP, *noncompliant with CPAP    While in ER:  [facility] presented to [facility] Noted potassium 3.1  She was given IV fluids without significant improvement in symptoms.  ER provider spoke with Dr. Leonel Ramsay with neurology. He recommends starting by getting patient MRIs of the spine to rule out any cord compression.  At [facility] long hospital MRI was out of order patient was transferred to [facility]  CT HEAD No specific etiology for headaches identified   CXR -  NON acute    Following Medications were ordered in ER: Medications  potassium chloride SA (KLOR-CON M) CR tablet 40 mEq (40 mEq Oral Given 09/23/22 1626)  sodium chloride 0.9 % bolus 1,000 mL (0 mLs Intravenous Stopped 09/23/22 1942)  LORazepam (ATIVAN) injection 2 mg (2 mg Intravenous Given 09/23/22 1945)     _______________________________________________________ ER Provider Called:   Neurology   Dr. Leonel Ramsay They Recommend admit to medicine  MRI of C spine and down    ED Triage Vitals  Enc Vitals Group     BP 09/23/22 1223 (!) 164/111     Pulse Rate 09/23/22 1223 (!) 102     Resp 09/23/22 1223 18     Temp 09/23/22 1223 98.5 F (36.9 C)     Temp Source 09/23/22 1223 Oral     SpO2 09/23/22 1219 100 %     Weight 09/23/22 1244 276 lb (125.2 kg)     Height 09/23/22 1244 '5\' 4"'$  (1.626 m)     Head Circumference --      Peak Flow --      Pain Score 09/23/22 1242 10     Pain Loc --      Pain Edu? --      Excl. in [place]? --   TMAX(24)@     _________________________________________ Significant initial  Findings: Abnormal Labs Reviewed  COMPREHENSIVE METABOLIC PANEL - Abnormal; Notable for the following components:      Result Value   Potassium 3.1 (*)    Glucose, Bld 115 (*)    Calcium 8.7 (*)    Albumin 3.1 (*)    Anion gap 16 (*)    All other components within normal limits  CBC WITH DIFFERENTIAL/PLATELET - Abnormal; Notable for the following components:   WBC 10.9 (*)    Neutro Abs 7.9 (*)    All other components within normal limits     ECG: Ordered Personally reviewed and interpreted by me showing: HR : 96 Rhythm Sinus rhythm Borderline repolarization abnormality Prolonged QT interval  QTC 550   The recent clinical data is shown below. Vitals:   09/23/22 1839 09/23/22 1915 09/23/22 1918 09/23/22 1921  BP:  111/88    Pulse: 97 97    Resp: 13 14    Temp:   99.1 F (37.3 C)   TempSrc:   Oral   SpO2: 100% 100%    Weight:    125.2 kg  Height:    '5\' 4"'$  (1.626 m)    WBC     Component Value Date/Time   WBC 10.9 (H) 09/23/2022 1331   LYMPHSABS 2.0 09/23/2022 1331   MONOABS 0.6 09/23/2022 1331   EOSABS  0.4 09/23/2022 1331   BASOSABS 0.1 09/23/2022 1331      Lactic Acid, Venous No results found for: "LATICACIDVEN"   Procalcitonin *** Ordered Lactic Acid,  Venous No results found for: "LATICACIDVEN"   Procalcitonin *** Ordered   UA  ordered   Urine analysis:    Component Value Date/Time   COLORURINE YELLOW 05/03/2022 [place] 05/03/2022 1345   LABSPEC 1.025 05/03/2022 1345   PHURINE 6.0 05/03/2022 1345   GLUCOSEU NEGATIVE 05/03/2022 1345   HGBUR NEGATIVE 05/03/2022 1345   BILIRUBINUR MODERATE (A) 05/03/2022 1345   BILIRUBINUR neg 12/31/2021 1311   KETONESUR 40 (A) 05/03/2022 1345   PROTEINUR Positive (A) 12/31/2021 1311   PROTEINUR 30 (A) 02/14/2019 1410   UROBILINOGEN 1.0 05/03/2022 1345   NITRITE NEGATIVE 05/03/2022 1345   LEUKOCYTESUR TRACE (A) 05/03/2022 1345    Results for orders placed or performed during the hospital encounter of 09/23/22  Resp panel by RT-PCR (RSV, Flu A&B, Covid) Anterior Nasal Swab     Status: None   Collection Time: 09/23/22  1:31 PM   Specimen: Anterior Nasal Swab  Result Value Ref Range Status   SARS Coronavirus 2 by RT PCR NEGATIVE NEGATIVE Final         Influenza A by PCR NEGATIVE NEGATIVE Final   Influenza B by PCR NEGATIVE NEGATIVE Final         Resp Syncytial Virus by PCR NEGATIVE NEGATIVE Final           _______________________________________________ Hospitalist was called for admission for   falls, debility and Hypokalemia   The following Work up has been ordered so far:  Orders Placed This Encounter  Procedures   Resp panel by RT-PCR (RSV, Flu A&B, Covid) Anterior Nasal Swab   DG Chest Portable 1 View   CT Head Wo Contrast   MR Cervical Spine W or Wo Contrast   MR THORACIC SPINE W WO CONTRAST   MR Lumbar Spine W Wo Contrast   Urinalysis, Routine w reflex microscopic Urine, Clean Catch   Comprehensive metabolic panel   CBC with Differential   Cardiac monitoring   Consult to neurology   Consult to hospitalist   Consult to hospitalist   Pulse oximetry, continuous   ED EKG   EKG 12-Lead     OTHER Significant initial  Findings:  labs showing:     Recent Labs  Lab 09/23/22 1331  NA 139  K 3.1*  CO2 25  GLUCOSE 115*  BUN 10  CREATININE 0.86  CALCIUM 8.7*    Cr   stable,    Lab Results  Component Value Date   CREATININE 0.86 09/23/2022   CREATININE 0.89 09/05/2022   CREATININE 0.72 05/01/2022    Recent Labs  Lab 09/23/22 1331  AST 33  ALT 19  ALKPHOS 79  BILITOT 0.9  PROT 6.8  ALBUMIN 3.1*   Lab Results  Component Value Date   CALCIUM 8.7 (L) 09/23/2022    Plt: Lab Results  Component Value Date   PLT 395 09/23/2022        Arterial ***Venous  Blood Gas result:  pH *** pCO2 ***; pO2 ***;     %O2 Sat ***.  ABG    Component Value Date/Time   TCO2 24 11/25/2010 1355         Recent Labs  Lab 09/23/22 1331  WBC 10.9*  NEUTROABS 7.9*  HGB 13.1  HCT 41.5  MCV 93.7  PLT 395    HG/HCT  stable     Component Value Date/Time   HGB 13.1 09/23/2022 1331   HCT 41.5 09/23/2022 1331   MCV 93.7 09/23/2022 1331    Cardiac Panel (last 3 results) No results for input(s): "CKTOTAL", "CKMB", "TROPONINI", "RELINDX" in the last 72 hours.  .car BNP (last 3 results) No results for input(s): "BNP" in the last 8760 hours.    DM  labs:  HbA1C: Recent Labs    09/05/22 1132  HGBA1C 6.1    CBG (last 3)  No results for input(s): "GLUCAP" in the last 72 hours.    Cultures:    Component Value Date/Time   SDES URINE, RANDOM 02/14/2019 1410   SPECREQUEST  02/14/2019 1410    NONE Performed at [facility], [place] [address] [place], [place] [address]    CULT MULTIPLE SPECIES PRESENT, SUGGEST RECOLLECTION (A) 02/14/2019 1410   REPTSTATUS 02/15/2019 FINAL 02/14/2019 1410     Radiological Exams on Admission: CT Head Wo Contrast  Result Date: 09/23/2022 CLINICAL DATA:  Headache EXAM: CT HEAD WITHOUT CONTRAST TECHNIQUE: Contiguous axial images were obtained from the base of the skull through the vertex without intravenous contrast. RADIATION DOSE REDUCTION: This exam was performed according to the  departmental dose-optimization program which includes automated exposure control, adjustment of the mA and/or kV according to patient size and/or use of iterative reconstruction technique. COMPARISON:  None Available. FINDINGS: Brain: No evidence of acute infarction, hemorrhage, hydrocephalus, extra-axial collection or mass lesion/mass effect. Partially empty sella. Vascular: No hyperdense vessel or unexpected calcification. Skull: Normal. Negative for fracture or focal lesion. Sinuses/Orbits: No acute finding. Other: None. IMPRESSION: No specific etiology for headaches identified. Electronically Signed   By: Marin Roberts M.D.   On: 09/23/2022 17:01   DG Chest Portable 1 View  Result Date: 09/23/2022 CLINICAL DATA:  Numbness all over.  Weakness. EXAM: PORTABLE CHEST 1 VIEW COMPARISON:  04/23/2018. FINDINGS: Normal heart, mediastinum and hila. Clear lungs.  No pleural effusion or pneumothorax. Skeletal structures are grossly intact. Stable changes from a previous anterior cervical spine fusion. IMPRESSION: No active disease. Electronically Signed   By: Lajean Manes M.D.   On: 09/23/2022 13:22   _______________________________________________________________________________________________________ Latest  Blood pressure 111/88, pulse 97, temperature 99.1 F (37.3 C), temperature source Oral, resp. rate 14, height '5\' 4"'$  (1.626 m), weight 125.2 kg, last menstrual period 08/17/2012, SpO2 100 %.   Vitals  labs and radiology finding personally reviewed  Review of Systems:    Pertinent positives include:  gait abnormality,   Constitutional:  No weight loss, night sweats, Fevers, chills, fatigue, weight loss  HEENT:  No headaches, Difficulty swallowing,Tooth/dental problems,Sore throat,  No sneezing, itching, ear ache, nasal congestion, post nasal drip,  Cardio-vascular:  No chest pain, Orthopnea, PND, anasarca, dizziness, palpitations.no Bilateral lower extremity swelling  GI:  No heartburn,  indigestion, abdominal pain, nausea, vomiting, diarrhea, change in bowel habits, loss of appetite, melena, blood in stool, hematemesis Resp:  no shortness of breath at rest. No dyspnea on exertion, No excess mucus, no productive cough, No non-productive cough, No coughing up of blood.No change in color of mucus.No wheezing. Skin:  no rash or lesions. No jaundice GU:  no dysuria, change in color of urine, no urgency or frequency. No straining to urinate.  No flank pain.  Musculoskeletal:  No joint pain or no joint swelling. No decreased range of motion. No back pain.  Psych:  No change in mood or affect. No depression or anxiety. No memory loss.  Neuro: no localizing neurological complaints, no tingling, no weakness, no double vision, no no slurred speech, no confusion  All systems reviewed and apart from [place] all are negative _______________________________________________________________________________________________ Past Medical History:   Past Medical History:  Diagnosis Date   Anxiety    Back pain with radiation    Breast mass, right    Chronic pain    Chronic, continuous use of opioids    Complication of anesthesia 04/07/14   Allergic reaction to Lisinopril immediately following surgery   DJD (degenerative joint disease)    Fibromyalgia    Groin abscess    Headache(784.0)    History of IBS    Hypercholesterolemia    IBS (irritable bowel syndrome)    Lactose intolerance    Mild hypertension    Obesity    Tobacco use disorder    Umbilical hernia    Symptomatic      Past Surgical History:  Procedure Laterality Date   ABDOMINAL HYSTERECTOMY     ANTERIOR CERVICAL DECOMP/DISCECTOMY FUSION N/A 12/20/2015   Procedure: ANTERIOR CERVICAL DECOMPRESSION FUSION CERVICAL 4-5, CERVICAL 5-6, CERVICAL 6-7 WITH INSTRUMENTATION AND ALLOGRAFT;  Surgeon: Phylliss Bob, MD;  Location: [facility];  Service: Orthopedics;  Laterality: N/A;  Anterior cervical decompression fusion, cervical 4-5,  cervical 5-6, cervical 6-7 with instrumentation and allograft   BACK SURGERY     BILATERAL SALPINGECTOMY  09/03/2012   Procedure: BILATERAL SALPINGECTOMY;  Surgeon: [name], MD;  Location: [facility];  Service: Gynecology;  Laterality: Bilateral;   BREAST BIOPSY Right 04/07/2014   Procedure: REMOVAL RIGHT BREAST MASS WITH WIRE LOCALIZATION;  Surgeon: [name], MD;  Location: [facility];  Service: General;  Laterality: Right;   BREAST EXCISIONAL BIOPSY Left    x2   BREAST LUMPECTOMY     x2   CARPAL TUNNEL RELEASE Left 05/11/2019   Procedure: LEFT CARPAL TUNNEL RELEASE, RIGHT TENNIS ELBOW MARCAINE/DEPO MEDROL INJECTION UNDER ANESTHESIA;  Surgeon: Jessy Oto, MD;  Location: [facility];  Service: Orthopedics;  Laterality: Left;   COLONOSCOPY W/ BIOPSIES  04/24/2012   per Dr. Carlean Purl, clear, repeat in 10 yrs    disectomy     ESOPHAGOGASTRODUODENOSCOPY     FINGER SURGERY     Right index-excision of mass    FOOT SURGERY Right    Bone Spurs   LAPAROSCOPIC HYSTERECTOMY  09/03/2012   Procedure: HYSTERECTOMY TOTAL LAPAROSCOPIC;  Surgeon: [name], MD;  Location: [facility];  Service: Gynecology;  Laterality: N/A;   LUMBAR DISC SURGERY     TUBAL LIGATION     UMBILICAL HERNIA REPAIR N/A 05/01/2018   Procedure: UMBILICAL HERNIA REPAIR;  Surgeon: Judeth Horn, MD;  Location: [facility];  Service: General;  Laterality: N/A;    Social History:  Ambulatory  walker  wheelchair bound     reports that she has quit smoking. Her smoking use included cigarettes. She has never used smokeless tobacco. She reports that she does not currently use alcohol. She reports current drug use.     Family History:   Family History  Problem Relation Age of Onset   Diabetes Father    Diabetes Mother    Prostate cancer Paternal Grandfather    Breast cancer Maternal Aunt 60   ______________________________________________________________________________________________ Allergies: Allergies  Allergen  Reactions   Lisinopril Anaphylaxis    was hospitalized for 3 days   Codeine Hives   Hydrocodone Hives   Penicillins Itching and Swelling    PATIENT HAS HAD A PCN REACTION WITH IMMEDIATE  RASH, FACIAL/TONGUE/THROAT SWELLING, SOB, OR LIGHTHEADEDNESS WITH HYPOTENSION:  #  #  YES  #  #  Has patient had a PCN reaction causing severe rash involving mucus membranes or skin necrosis: No Has patient had a PCN reaction that required hospitalization No Has patient had a PCN reaction occurring within the last 10 years: No If all of the above answers are "NO", then may proceed with Cephalosporin use.    Amlodipine Swelling   Acetaminophen Rash     Prior to Admission medications   Medication Sig Start Date End Date Taking? Authorizing Provider  albuterol (VENTOLIN HFA) 108 (90 Base) MCG/ACT inhaler Inhale 2 puffs into the lungs every 6 (six) hours as needed for wheezing or shortness of breath. 11/27/21   Lucretia Kern, DO  ALPRAZolam (XANAX) 0.5 MG tablet TAKE 1 TABLET (0.5 MG TOTAL) BY MOUTH 3 (THREE) TIMES DAILY AS NEEDED FOR ANXIETY. 06/20/22   [name], MD  benzonatate (TESSALON PERLES) 100 MG capsule 1-2 capsules up to twice daily as needed for cough 07/04/22   Lucretia Kern, DO  cyanocobalamin (VITAMIN B12) 1000 MCG/ML injection Inject 1 mL (1,000 mcg total) into the muscle once a week. 09/05/22   [name], MD  cyclobenzaprine (FLEXERIL) 10 MG tablet TAKE 1 TABLET BY MOUTH EVERYDAY AT BEDTIME 09/02/22   [name], MD  dorzolamide-timolol (COSOPT) 2-0.5 % ophthalmic solution SMARTSIG:In Eye(s) 05/29/22   [provider]  famotidine (PEPCID) 20 MG tablet Take 1 tablet (20 mg total) by mouth 2 (two) times daily. 09/05/22   [name], MD  fluticasone [facility]) 50 MCG/ACT nasal spray SPRAY 2 SPRAYS INTO EACH NOSTRIL EVERY DAY 01/10/22   [name], MD  folic acid (FOLVITE) 1 MG tablet Take 1 tablet (1 mg total) by mouth daily. 09/02/22   [name], MD  furosemide  (LASIX) 40 MG tablet Take 1 tablet (40 mg total) by mouth in the morning and at bedtime. 09/02/22   [name], MD  gabapentin (NEURONTIN) 300 MG capsule Take 1 capsule (300 mg total) by mouth 3 (three) times daily. 09/05/22   [name], MD  HYDROmorphone (DILAUDID) 4 MG tablet Take 1 tablet (4 mg total) by mouth every 6 (six) hours as needed for severe pain. 09/03/22 10/03/22  [name], MD  latanoprost (XALATAN) 0.005 % ophthalmic solution 1 drop at bedtime. 12/13/21   [provider]  magic mouthwash (nystatin, hydrocortisone, diphenhydrAMINE) suspension Swish and swallow 5 mLs as directed. 11/15/21   [name], MD  meloxicam (MOBIC) 15 MG tablet as needed.    [provider]  metoprolol tartrate (LOPRESSOR) 50 MG tablet Take 1 tablet (50 mg total) by mouth 2 (two) times daily. 09/02/22   [name], MD  ondansetron (ZOFRAN) 4 MG tablet Take 1 tablet (4 mg total) by mouth every 8 (eight) hours as needed for nausea or vomiting. 12/25/21   [name], MD  pantoprazole (PROTONIX) 40 MG tablet Take 40 mg by mouth daily. 12/10/21   [provider]  potassium chloride (KLOR-CON 10) 10 MEQ tablet Take 1 tablet (10 mEq total) by mouth 2 (two) times daily. 09/02/22   [name], MD  predniSONE (STERAPRED UNI-PAK 21 TAB) 10 MG (21) TBPK tablet Take as directed on package 07/13/22   Magnant, Gerrianne Scale, PA-C  promethazine (PHENERGAN) 25 MG tablet Take 1 tablet (25 mg total) by mouth every 4 (four) hours as needed for  nausea or vomiting. 09/05/22   [name], MD  sucralfate (CARAFATE) 1 GM/10ML suspension Take 10 mLs (1 g total) by mouth 4 (four) times daily -  with meals and at bedtime. 07/26/21   Nafziger, Tommi Rumps, NP  SYRINGE-NEEDLE, DISP, 3 ML (LUER LOCK SAFETY SYRINGES) 25G X 1" 3 ML MISC 1 Application by Does not apply route once a week. 09/05/22   [name], MD  temazepam (RESTORIL) 30 MG capsule Take 1 capsule (30 mg total) by mouth at bedtime as  needed for sleep. 09/02/22   [name], MD    ___________________________________________________________________________________________________ Physical Exam:    09/23/2022    7:21 PM 09/23/2022    7:15 PM 09/23/2022    6:39 PM  Vitals with BMI  Height '5\' 4"'$     Weight 276 lbs    BMI 61.95    Systolic  093   Diastolic  88   Pulse  97 97    1. General:  in No  Acute distress    Chronically ill   -appearing 2. Psychological: Alert and   Oriented 3. Head/ENT:  Dry Mucous Membranes                          Head Non traumatic, neck supple                         Poor Dentition 4. SKIN: decreased Skin turgor,  Skin clean Dry and intact no rash 5. Heart: Regular rate and rhythm no*** Murmur, no Rub or gallop 6. Lungsdsitant  no wheezes or crackles   7. Abdomen: Soft, ***non-tender, Non distended   obese  bowel sounds present 8. Lower extremities: no clubbing, cyanosis, no ***edema 9. Neurologically Grossly intact, moving all 4 extremities equally *** strength 5 out of 5 in all 4 extremities cranial nerves II through XII intact 10. MSK: Normal range of motion    Chart has been reviewed  ______________________________________________________________________________________________  Assessment/Plan  57 y.o. female with medical history significant of IBS, fibromyalgia hypertension hyperlipidemia chronic back pain   Admitted for debility Hypokalemia     Present on Admission:  Debility  HTN (hypertension)  Obesity  Hypokalemia     Debility Will have PT OT assess and evaluate.  This is progressive and ongoing associated with numbness has been followed as an outpatient and had her B12 replaced.  MRI of cervical thoracic and lumbar spine ordered.  To evaluate for any neurological abnormalities.  Treat as needed.  Neurology is aware.  Will need possible placement as patient now unable to ambulate. Check B12 folate CRP sed rate aldolase TSH CK  HTN (hypertension) Allow  permissive hypertension  Obesity Chronic will need outpatient nutritional consult and follow-up  Hypokalemia Will replace and recheck check other electrolytes   Other plan as per orders.  DVT prophylaxis:  SCD       Code Status:    Code Status: Prior FULL CODE   as per patient ***family  I had personally discussed CODE STATUS with patient and family* I had spent *min discussing goals of care and CODE STATUS    Family Communication:   Family not at  Bedside  plan of care was discussed on the phone with *** Son, Daughter, Wife, Husband, Sister, Brother , father, mother  Disposition Plan:     likely will need placement for rehabilitation                         ***  Following barriers for discharge:                            Electrolytes corrected                               Anemia corrected                             Pain controlled with PO medications                               Afebrile, white count improving able to transition to PO antibiotics                             Will need to be able to tolerate PO                            Will likely need home health, home O2, set up                           Will need consultants to evaluate patient prior to discharge  ****EXPECT DC tomorrow                     Would benefit from PT/OT eval prior to DC  Ordered                                   Consults called:  Neurology is aware  Admission status:  ED Disposition     ED Disposition  [place]: [facility] [[id number]]  Level of Care: Telemetry Medical [[id number]]  May place patient in observation at [facility] or [facility] if equivalent level of care is available:: No  Covid Evaluation: Asymptomatic - no recent exposure (last 10 days) testing not required  Diagnosis: Debility [[id number]]  Admitting Physician: Toy Baker [[id number]]  Attending Physician: Toy Baker [[id number]]           Obs     Level  of care     tele  For 12H    Lab Results  Component Value Date   [place] 09/23/2022     Precautions: admitted as   Covid Negative     Jesusa Stenerson 09/23/2022, 8:25 PM ***  [facility]     after 2 AM please page floor coverage PA If 7AM-7PM, please contact the day team taking care of the patient using [email or web address]   Patient was evaluated in the context of the global COVID-19 pandemic, which necessitated consideration that the patient might be at risk for infection with the SARS-CoV-2 virus that causes COVID-19. Institutional protocols and algorithms that pertain to the evaluation of patients at risk for COVID-19 are in a state of rapid change based on information released by regulatory bodies including the CDC and federal and state organizations. These policies and algorithms were followed during the patient's care.

## 2022-09-24 ENCOUNTER — Observation Stay (HOSPITAL_COMMUNITY): Payer: 59

## 2022-09-24 DIAGNOSIS — N319 Neuromuscular dysfunction of bladder, unspecified: Secondary | ICD-10-CM | POA: Diagnosis present

## 2022-09-24 DIAGNOSIS — M47814 Spondylosis without myelopathy or radiculopathy, thoracic region: Secondary | ICD-10-CM | POA: Diagnosis present

## 2022-09-24 DIAGNOSIS — H409 Unspecified glaucoma: Secondary | ICD-10-CM | POA: Diagnosis present

## 2022-09-24 DIAGNOSIS — N390 Urinary tract infection, site not specified: Secondary | ICD-10-CM | POA: Diagnosis present

## 2022-09-24 DIAGNOSIS — K5901 Slow transit constipation: Secondary | ICD-10-CM | POA: Diagnosis not present

## 2022-09-24 DIAGNOSIS — M797 Fibromyalgia: Secondary | ICD-10-CM | POA: Diagnosis present

## 2022-09-24 DIAGNOSIS — G8929 Other chronic pain: Secondary | ICD-10-CM | POA: Diagnosis present

## 2022-09-24 DIAGNOSIS — Z6841 Body Mass Index (BMI) 40.0 and over, adult: Secondary | ICD-10-CM | POA: Diagnosis not present

## 2022-09-24 DIAGNOSIS — E876 Hypokalemia: Secondary | ICD-10-CM | POA: Diagnosis present

## 2022-09-24 DIAGNOSIS — N179 Acute kidney failure, unspecified: Secondary | ICD-10-CM | POA: Diagnosis present

## 2022-09-24 DIAGNOSIS — E8809 Other disorders of plasma-protein metabolism, not elsewhere classified: Secondary | ICD-10-CM | POA: Diagnosis present

## 2022-09-24 DIAGNOSIS — R600 Localized edema: Secondary | ICD-10-CM | POA: Diagnosis not present

## 2022-09-24 DIAGNOSIS — F411 Generalized anxiety disorder: Secondary | ICD-10-CM | POA: Diagnosis present

## 2022-09-24 DIAGNOSIS — Z803 Family history of malignant neoplasm of breast: Secondary | ICD-10-CM | POA: Diagnosis not present

## 2022-09-24 DIAGNOSIS — M4804 Spinal stenosis, thoracic region: Secondary | ICD-10-CM | POA: Diagnosis present

## 2022-09-24 DIAGNOSIS — R262 Difficulty in walking, not elsewhere classified: Secondary | ICD-10-CM | POA: Diagnosis present

## 2022-09-24 DIAGNOSIS — E538 Deficiency of other specified B group vitamins: Secondary | ICD-10-CM | POA: Diagnosis present

## 2022-09-24 DIAGNOSIS — F419 Anxiety disorder, unspecified: Secondary | ICD-10-CM | POA: Diagnosis present

## 2022-09-24 DIAGNOSIS — E7800 Pure hypercholesterolemia, unspecified: Secondary | ICD-10-CM | POA: Diagnosis present

## 2022-09-24 DIAGNOSIS — K5900 Constipation, unspecified: Secondary | ICD-10-CM | POA: Diagnosis not present

## 2022-09-24 DIAGNOSIS — R531 Weakness: Secondary | ICD-10-CM

## 2022-09-24 DIAGNOSIS — G4700 Insomnia, unspecified: Secondary | ICD-10-CM | POA: Diagnosis present

## 2022-09-24 DIAGNOSIS — M545 Low back pain, unspecified: Secondary | ICD-10-CM | POA: Diagnosis not present

## 2022-09-24 DIAGNOSIS — R5381 Other malaise: Secondary | ICD-10-CM | POA: Diagnosis present

## 2022-09-24 DIAGNOSIS — R519 Headache, unspecified: Secondary | ICD-10-CM | POA: Diagnosis present

## 2022-09-24 DIAGNOSIS — G610 Guillain-Barre syndrome: Secondary | ICD-10-CM | POA: Diagnosis present

## 2022-09-24 DIAGNOSIS — K589 Irritable bowel syndrome without diarrhea: Secondary | ICD-10-CM | POA: Diagnosis present

## 2022-09-24 DIAGNOSIS — Z90710 Acquired absence of both cervix and uterus: Secondary | ICD-10-CM | POA: Diagnosis not present

## 2022-09-24 DIAGNOSIS — M5416 Radiculopathy, lumbar region: Secondary | ICD-10-CM | POA: Diagnosis present

## 2022-09-24 DIAGNOSIS — M4802 Spinal stenosis, cervical region: Secondary | ICD-10-CM | POA: Diagnosis present

## 2022-09-24 DIAGNOSIS — K219 Gastro-esophageal reflux disease without esophagitis: Secondary | ICD-10-CM | POA: Diagnosis present

## 2022-09-24 DIAGNOSIS — F1120 Opioid dependence, uncomplicated: Secondary | ICD-10-CM | POA: Diagnosis present

## 2022-09-24 DIAGNOSIS — I10 Essential (primary) hypertension: Secondary | ICD-10-CM | POA: Diagnosis present

## 2022-09-24 DIAGNOSIS — Z79899 Other long term (current) drug therapy: Secondary | ICD-10-CM | POA: Diagnosis not present

## 2022-09-24 DIAGNOSIS — R1084 Generalized abdominal pain: Secondary | ICD-10-CM | POA: Diagnosis not present

## 2022-09-24 DIAGNOSIS — Z87891 Personal history of nicotine dependence: Secondary | ICD-10-CM | POA: Diagnosis not present

## 2022-09-24 DIAGNOSIS — Z1152 Encounter for screening for COVID-19: Secondary | ICD-10-CM | POA: Diagnosis not present

## 2022-09-24 DIAGNOSIS — E882 Lipomatosis, not elsewhere classified: Secondary | ICD-10-CM | POA: Diagnosis present

## 2022-09-24 DIAGNOSIS — E871 Hypo-osmolality and hyponatremia: Secondary | ICD-10-CM | POA: Diagnosis not present

## 2022-09-24 DIAGNOSIS — E875 Hyperkalemia: Secondary | ICD-10-CM | POA: Diagnosis not present

## 2022-09-24 DIAGNOSIS — B9620 Unspecified Escherichia coli [E. coli] as the cause of diseases classified elsewhere: Secondary | ICD-10-CM | POA: Diagnosis present

## 2022-09-24 DIAGNOSIS — Z833 Family history of diabetes mellitus: Secondary | ICD-10-CM | POA: Diagnosis not present

## 2022-09-24 LAB — CBC WITH DIFFERENTIAL/PLATELET
Abs Immature Granulocytes: 0.03 10*3/uL (ref 0.00–0.07)
Basophils Absolute: 0 10*3/uL (ref 0.0–0.1)
Basophils Relative: 0 %
Eosinophils Absolute: 0.3 10*3/uL (ref 0.0–0.5)
Eosinophils Relative: 4 %
HCT: 37.7 % (ref 36.0–46.0)
Hemoglobin: 11.9 g/dL — ABNORMAL LOW (ref 12.0–15.0)
Immature Granulocytes: 0 %
Lymphocytes Relative: 16 %
Lymphs Abs: 1.3 10*3/uL (ref 0.7–4.0)
MCH: 30.1 pg (ref 26.0–34.0)
MCHC: 31.6 g/dL (ref 30.0–36.0)
MCV: 95.2 fL (ref 80.0–100.0)
Monocytes Absolute: 0.7 10*3/uL (ref 0.1–1.0)
Monocytes Relative: 8 %
Neutro Abs: 5.8 10*3/uL (ref 1.7–7.7)
Neutrophils Relative %: 72 %
Platelets: 324 10*3/uL (ref 150–400)
RBC: 3.96 MIL/uL (ref 3.87–5.11)
RDW: 15.1 % (ref 11.5–15.5)
WBC: 8.1 10*3/uL (ref 4.0–10.5)
nRBC: 0 % (ref 0.0–0.2)

## 2022-09-24 LAB — CBC
HCT: 35.8 % — ABNORMAL LOW (ref 36.0–46.0)
Hemoglobin: 11.5 g/dL — ABNORMAL LOW (ref 12.0–15.0)
MCH: 29.9 pg (ref 26.0–34.0)
MCHC: 32.1 g/dL (ref 30.0–36.0)
MCV: 93.2 fL (ref 80.0–100.0)
Platelets: 342 10*3/uL (ref 150–400)
RBC: 3.84 MIL/uL — ABNORMAL LOW (ref 3.87–5.11)
RDW: 14.9 % (ref 11.5–15.5)
WBC: 10.1 10*3/uL (ref 4.0–10.5)
nRBC: 0 % (ref 0.0–0.2)

## 2022-09-24 LAB — BASIC METABOLIC PANEL
Anion gap: 12 (ref 5–15)
Anion gap: 9 (ref 5–15)
BUN: 7 mg/dL (ref 6–20)
BUN: 6 mg/dL (ref 6–20)
CO2: 26 mmol/L (ref 22–32)
CO2: 25 mmol/L (ref 22–32)
Calcium: 8.4 mg/dL — ABNORMAL LOW (ref 8.9–10.3)
Calcium: 8.1 mg/dL — ABNORMAL LOW (ref 8.9–10.3)
Chloride: 101 mmol/L (ref 98–111)
Chloride: 104 mmol/L (ref 98–111)
Creatinine, Ser: 0.87 mg/dL (ref 0.44–1.00)
Creatinine, Ser: 0.73 mg/dL (ref 0.44–1.00)
GFR, Estimated: >60 mL/min (ref >60)
GFR, Estimated: >60 mL/min (ref >60)
Glucose, Bld: 99 mg/dL (ref 70–99)
Glucose, Bld: 125 mg/dL — ABNORMAL HIGH (ref 70–99)
Potassium: 3.1 mmol/L — ABNORMAL LOW (ref 3.5–5.1)
Potassium: 3.5 mmol/L (ref 3.5–5.1)
Sodium: 139 mmol/L (ref 135–145)
Sodium: 138 mmol/L (ref 135–145)

## 2022-09-24 LAB — MAGNESIUM
Magnesium: 1.5 mg/dL — ABNORMAL LOW (ref 1.7–2.4)
Magnesium: 1.9 mg/dL (ref 1.7–2.4)

## 2022-09-24 LAB — LACTIC ACID, PLASMA: Lactic Acid, Venous: 1.2 mmol/L (ref 0.5–1.9)

## 2022-09-24 LAB — FOLATE: Folate: 6.6 ng/mL (ref >5.9)

## 2022-09-24 LAB — VITAMIN B12: Vitamin B-12: 1809 pg/mL — ABNORMAL HIGH (ref 180–914)

## 2022-09-24 LAB — C-REACTIVE PROTEIN: CRP: 5.9 mg/dL — ABNORMAL HIGH (ref <1.0)

## 2022-09-24 LAB — PREALBUMIN: Prealbumin: 12 mg/dL — ABNORMAL LOW (ref 18–38)

## 2022-09-24 LAB — SEDIMENTATION RATE: Sed Rate: 38 mm/hr — ABNORMAL HIGH (ref 0–22)

## 2022-09-24 LAB — PHOSPHORUS: Phosphorus: 3.6 mg/dL (ref 2.5–4.6)

## 2022-09-24 LAB — TSH: TSH: 3.882 u[IU]/mL (ref 0.350–4.500)

## 2022-09-24 MED ORDER — POTASSIUM CHLORIDE CRYS ER 20 MEQ PO TBCR
40.0000 meq | EXTENDED_RELEASE_TABLET | Freq: Once | ORAL | Status: AC
  Administered 2022-09-24: 40 meq via ORAL
  Filled 2022-09-24: qty 2

## 2022-09-24 MED ORDER — HYDRALAZINE HCL 20 MG/ML IJ SOLN
10.0000 mg | Freq: Four times a day (QID) | INTRAMUSCULAR | Status: DC | PRN
  Administered 2022-09-27 – 2022-09-29 (×2): 10 mg via INTRAVENOUS
  Filled 2022-09-24 (×3): qty 1

## 2022-09-24 MED ORDER — LIDOCAINE HCL (PF) 1 % IJ SOLN
5.0000 mL | Freq: Once | INTRAMUSCULAR | Status: AC
  Administered 2022-09-24: 3 mL

## 2022-09-24 MED ORDER — ALPRAZOLAM 0.25 MG PO TABS
0.2500 mg | ORAL_TABLET | Freq: Once | ORAL | Status: AC
  Administered 2022-09-24: 0.25 mg via ORAL
  Filled 2022-09-24: qty 1

## 2022-09-24 MED ORDER — MAGNESIUM SULFATE 2 GM/50ML IV SOLN
2.0000 g | Freq: Once | INTRAVENOUS | Status: AC
  Administered 2022-09-24: 2 g via INTRAVENOUS
  Filled 2022-09-24: qty 50

## 2022-09-24 MED ORDER — POTASSIUM CHLORIDE 10 MEQ/100ML IV SOLN
10.0000 meq | INTRAVENOUS | Status: AC
  Administered 2022-09-24 (×2): 10 meq via INTRAVENOUS
  Filled 2022-09-24 (×2): qty 100

## 2022-09-24 MED ORDER — LIDOCAINE HCL (PF) 1 % IJ SOLN
5.0000 mL | Freq: Once | INTRAMUSCULAR | Status: AC
  Administered 2022-09-24: 5 mL

## 2022-09-24 MED ORDER — SODIUM CHLORIDE 0.9 % IV SOLN
1.0000 g | Freq: Every day | INTRAVENOUS | Status: AC
  Administered 2022-09-24 – 2022-09-30 (×8): 1 g via INTRAVENOUS
  Filled 2022-09-24 (×8): qty 10

## 2022-09-24 MED ORDER — MAGNESIUM SULFATE IN D5W 1-5 GM/100ML-% IV SOLN: 1.0000 g | Freq: Once | INTRAVENOUS | Status: DC

## 2022-09-24 NOTE — Progress Notes (Signed)
NIF -40, VC 2.4L great patient effort.

## 2022-09-24 NOTE — Progress Notes (Signed)
? ?  Inpatient Rehab Admissions Coordinator : ? ?Per therapy recommendations, patient was screened for CIR candidacy by Alisia Vanengen RN MSN.  At this time patient appears to be a potential candidate for CIR. I will place a rehab consult per protocol for full assessment. Please call me with any questions. ? ?Nakiesha Rumsey RN MSN ?Admissions Coordinator ?[phone] ?  ?

## 2022-09-24 NOTE — Assessment & Plan Note (Signed)
-   treat with Rocephin         await results of urine culture and adjust antibiotic coverage as needed  

## 2022-09-24 NOTE — Assessment & Plan Note (Signed)
In a.m. can discuss father for with Dr. Maxie Better and his team who has been following him Supportive measures

## 2022-09-24 NOTE — Evaluation (Signed)
Occupational Therapy Evaluation Patient Details Name: Dana [name] MRN: [id number] DOB: [birth year] Today's Date: 09/24/2022   History of Present Illness Pt is a 57 y/o female who presented with progressive weakness and sensation changes. MRI negative, pending LP for possible GBS. PMH: anxiety, DJD, fibromyalgia, HTN, tobacco use.   Clinical Impression   PTA, pt lives with family, typically Modified Independent with ADLs, IADLs and mobility using Rollator. Pt presents now with deficits in strength, sensation (numb in trunk/peri region, diminished sensation in extremities), balance and endurance. Pt requires Min-Mod A for bed mobility, noted fear of falling and impaired proprioception due to numbness so deferred standing attempts at this time. Pt requires Min A for UB ADL and up to Max A for LB ADLs w/ lateral leans/bed level. Will further progress transfers in next session and transferred out of ED. Based on high fall risk and below typical baseline, rec AIR level therapies at this time.      Recommendations for follow up therapy are one component of a multi-disciplinary discharge planning process, led by the attending physician.  Recommendations may be updated based on patient status, additional functional criteria and insurance authorization.   Follow Up Recommendations  Acute inpatient rehab (3hours/day)     Assistance Recommended at Discharge Intermittent Supervision/Assistance  Patient can return home with the following A lot of help with walking and/or transfers;A lot of help with bathing/dressing/bathroom    Functional Status Assessment  Patient has had a recent decline in their functional status and demonstrates the ability to make significant improvements in function in a reasonable and predictable amount of time.  Equipment Recommendations  Other (comment) (TBD pending progress)    Recommendations for Other Services Rehab consult     Precautions / Restrictions  Precautions Precautions: Fall Restrictions Weight Bearing Restrictions: No      Mobility Bed Mobility Overal bed mobility: Needs Assistance Bed Mobility: Supine to Sit, Sit to Supine     Supine to sit: Mod assist Sit to supine: Min assist   General bed mobility comments: Assist with LLE to EOB and to scoot hips. Min A for LLE back onto stretcher    Transfers                   General transfer comment: deferred with stretcher setup and pt fear of falling      Balance Overall balance assessment: Needs assistance Sitting-balance support: Feet supported, No upper extremity supported Sitting balance-Leahy Scale: Fair                                     ADL either performed or assessed with clinical judgement   ADL Overall ADL's : Needs assistance/impaired Eating/Feeding: Modified independent   Grooming: Set up;Sitting Grooming Details (indicate cue type and reason): reports she was able to brush teeth sitting on edge of stretcher earlier Upper Body Bathing: Minimal assistance;Sitting   Lower Body Bathing: Moderate assistance;Sitting/lateral leans;Bed level   Upper Body Dressing : Minimal assistance;Sitting   Lower Body Dressing: Maximal assistance;Sitting/lateral leans;Bed level       Toileting- Clothing Manipulation and Hygiene: Maximal assistance;Sitting/lateral lean;Bed level         General ADL Comments: Pt fearful of falling, focus on EOB assessment initially with noted impaired sensation diffusely around trunk/BLE and variably with UEs. Educated on transfer progression, likely benefits of Stedy trial for confidence/security     Vision Baseline Vision/History: 1  Wears glasses Ability to See in Adequate Light: 0 Adequate Patient Visual Report: No change from baseline Vision Assessment?: No apparent visual deficits     Perception     Praxis      Pertinent Vitals/Pain Pain Assessment Pain Assessment: Faces Faces Pain Scale: Hurts  a little bit Pain Location: constant generalized pain that is chronic Pain Descriptors / Indicators: Aching Pain Intervention(s): Monitored during session     Hand Dominance Right   Extremity/Trunk Assessment Upper Extremity Assessment Upper Extremity Assessment: RUE deficits/detail;LUE deficits/detail RUE Deficits / Details: impaired sensation grossly, still able to functionally hold objects and demo fair strength   Lower Extremity Assessment Lower Extremity Assessment: Defer to PT evaluation   Cervical / Trunk Assessment Cervical / Trunk Assessment: Other exceptions Cervical / Trunk Exceptions: reports stomach region numb   Communication Communication Communication: No difficulties   Cognition Arousal/Alertness: Awake/alert Behavior During Therapy: WFL for tasks assessed/performed Overall Cognitive Status: Within Functional Limits for tasks assessed                                 General Comments: anxious with prospect of mobility but responded well to encouragement/calming     General Comments       Exercises     Shoulder Instructions      Home Living Family/patient expects to be discharged to:: Private residence Living Arrangements: Spouse/significant other;Other (Comment) (mother-in-law) Available Help at Discharge: Family;Available PRN/intermittently Type of Home: House Home Access: Ramped entrance     Home Layout: One level     Bathroom Shower/Tub: Teacher, early years/pre: Standard     Home Equipment: Rollator (4 wheels)          Prior Functioning/Environment Prior Level of Function : Independent/Modified Independent;Driving             Mobility Comments: Progressive weakness since early December but prior to that was completely independent with AD.  Did not work prior due to on disability.  At baseline uses cane outside and rollator in home. ADLs Comments: Reports independent adls and iadls, likes to go shopping with  sister        OT Problem List: Decreased strength;Impaired balance (sitting and/or standing);Decreased activity tolerance;Decreased knowledge of use of DME or AE;Impaired sensation;Pain      OT Treatment/Interventions: Self-care/ADL training;Energy conservation;DME and/or AE instruction;Therapeutic activities;Patient/family education    OT Goals(Current goals can be found in the care plan section) Acute Rehab OT Goals Patient Stated Goal: avoid falls, be able to return to normal OT Goal Formulation: With patient Time For Goal Achievement: 10/08/22 Potential to Achieve Goals: Good  OT Frequency: Min 2X/week    Co-evaluation PT/OT/SLP Co-Evaluation/Treatment: Yes Reason for Co-Treatment: For patient/therapist safety;To address functional/ADL transfers   OT goals addressed during session: ADL's and self-care;Strengthening/ROM      AM-PAC OT "6 Clicks" Daily Activity     Outcome Measure Help from another person eating meals?: None Help from another person taking care of personal grooming?: A Little Help from another person toileting, which includes using toliet, bedpan, or urinal?: A Lot Help from another person bathing (including washing, rinsing, drying)?: A Lot Help from another person to put on and taking off regular upper body clothing?: A Little Help from another person to put on and taking off regular lower body clothing?: A Lot 6 Click Score: 16   End of Session    Activity Tolerance: Patient tolerated treatment  well Patient left: in bed;with call bell/phone within reach  OT Visit Diagnosis: Other abnormalities of gait and mobility (R26.89);Muscle weakness (generalized) (M62.81)                Time: 5087-1994 OT Time Calculation (min): 38 min Charges:  OT General Charges $OT Visit: 1 Visit OT Evaluation $OT Eval Moderate Complexity: 1 Mod  Malachy Chamber, OTR/L Acute Rehab Services Office: [phone]   Layla Maw 09/24/2022, 11:44 AM

## 2022-09-24 NOTE — ED Notes (Signed)
ED TO INPATIENT HANDOFF REPORT  ED Nurse Name and Phone #: Suella Grove [phone]  S Name/Age/Gender Dana [name] 57 y.o. female Room/Bed: 045C/045C  Code Status   Code Status: Full Code  Home/SNF/Other Home Patient oriented to: self, place, time, and situation Is this baseline? Yes   Triage Complete: Triage complete  Chief Complaint Debility [R53.81]  Triage Note Per EMS- Patient is from home. Patient c/o weakness and numbness "all over" since 08/23/22 and states it is worsening.   Patient reports when she picks objects up she drops them, numbness of the entire neck and lips. Patient states she has decreased mobility due to the numbness.  Patient also reports that she is unable to bear weight on her lower extremities and fell last night. Patient states she fell on her buttocks and did not hit her head.  Pt via [facility] for [facility]. She has had trouble walking recently with global weakness. A/O x 4  BP >902 systolic   Allergies Allergies  Allergen Reactions   Lisinopril Anaphylaxis    was hospitalized for 3 days   Codeine Hives   Hydrocodone Hives   Penicillins Itching and Swelling    PATIENT HAS HAD A PCN REACTION WITH IMMEDIATE RASH, FACIAL/TONGUE/THROAT SWELLING, SOB, OR LIGHTHEADEDNESS WITH HYPOTENSION:  #  #  YES  #  #  Has patient had a PCN reaction causing severe rash involving mucus membranes or skin necrosis: No Has patient had a PCN reaction that required hospitalization No Has patient had a PCN reaction occurring within the last 10 years: No If all of the above answers are "NO", then may proceed with Cephalosporin use.    Amlodipine Swelling   Acetaminophen Rash    Level of Care/Admitting Diagnosis ED Disposition     ED Disposition  Admit   Condition  --   Comment  Hospital Area: [facility] [[id number]]  Level of Care: Telemetry Medical [104]  May place patient in observation at [facility] or [place] if equivalent level of  care is available:: No  Covid Evaluation: Asymptomatic - no recent exposure (last 10 days) testing not required  Diagnosis: Debility [409735]  Admitting Physician: [place], [place]  Attending Physician: Toy Baker [[id number]]          B Medical/Surgery History Past Medical History:  Diagnosis Date   Anxiety    Back pain with radiation    Breast mass, right    Chronic pain    Chronic, continuous use of opioids    Complication of anesthesia 04/07/14   Allergic reaction to Lisinopril immediately following surgery   DJD (degenerative joint disease)    Fibromyalgia    Groin abscess    Headache(784.0)    History of IBS    Hypercholesterolemia    IBS (irritable bowel syndrome)    Lactose intolerance    Mild hypertension    Obesity    Tobacco use disorder    Umbilical hernia    Symptomatic   Past Surgical History:  Procedure Laterality Date   ABDOMINAL HYSTERECTOMY     ANTERIOR CERVICAL DECOMP/DISCECTOMY FUSION N/A 12/20/2015   Procedure: ANTERIOR CERVICAL DECOMPRESSION FUSION CERVICAL 4-5, CERVICAL 5-6, CERVICAL 6-7 WITH INSTRUMENTATION AND ALLOGRAFT;  Surgeon: Phylliss Bob, MD;  Location: [place];  Service: Orthopedics;  Laterality: N/A;  Anterior cervical decompression fusion, cervical 4-5, cervical 5-6, cervical 6-7 with instrumentation and allograft   BACK SURGERY     BILATERAL SALPINGECTOMY  09/03/2012   Procedure: BILATERAL SALPINGECTOMY;  Surgeon: [name], MD;  Location: [facility];  Service: Gynecology;  Laterality: Bilateral;   BREAST BIOPSY Right 04/07/2014   Procedure: REMOVAL RIGHT BREAST MASS WITH WIRE LOCALIZATION;  Surgeon: [name], MD;  Location: [facility];  Service: General;  Laterality: Right;   BREAST EXCISIONAL BIOPSY Left    x2   BREAST LUMPECTOMY     x2   CARPAL TUNNEL RELEASE Left 05/11/2019   Procedure: LEFT CARPAL TUNNEL RELEASE, RIGHT TENNIS ELBOW MARCAINE/DEPO MEDROL INJECTION UNDER ANESTHESIA;  Surgeon: Jessy Oto, MD;   Location: [facility];  Service: Orthopedics;  Laterality: Left;   COLONOSCOPY W/ BIOPSIES  04/24/2012   per Dr. Carlean Purl, clear, repeat in 10 yrs    disectomy     ESOPHAGOGASTRODUODENOSCOPY     FINGER SURGERY     Right index-excision of mass    FOOT SURGERY Right    Bone Spurs   LAPAROSCOPIC HYSTERECTOMY  09/03/2012   Procedure: HYSTERECTOMY TOTAL LAPAROSCOPIC;  Surgeon: [name], MD;  Location: [facility];  Service: Gynecology;  Laterality: N/A;   LUMBAR DISC SURGERY     TUBAL LIGATION     UMBILICAL HERNIA REPAIR N/A 05/01/2018   Procedure: UMBILICAL HERNIA REPAIR;  Surgeon: Judeth Horn, MD;  Location: [facility];  Service: General;  Laterality: N/A;     A IV Location/Drains/Wounds Patient Lines/Drains/Airways Status     Active Line/Drains/Airways     Name Placement date Placement time Site Days   Peripheral IV 09/23/22 18 G 1" Right Antecubital 09/23/22  1625  Antecubital  1   Incision 09/03/12 Perineum Other (Comment) 09/03/12  0807  -- 3673   Incision (Closed) 04/07/14 Breast Right 04/07/14  0927  -- 3092   Incision (Closed) 12/20/15 Neck Other (Comment) 12/20/15  0935  -- 2470   Incision (Closed) 05/01/18 Abdomen 05/01/18  1137  -- 1607   Incision (Closed) 05/11/19 Hand Left 05/11/19  0900  -- 1232   Incision - 3 Ports Abdomen 1: Umbilicus 2: Left;Lower 3: Right;Lower 09/03/12  0909  -- 3673            Intake/Output Last 24 hours  Intake/Output Summary (Last 24 hours) at 09/24/2022 1152 Last data filed at 09/24/2022 0425 Gross per 24 hour  Intake 1300.6 ml  Output --  Net 1300.6 ml    Labs/Imaging Results for orders placed or performed during the hospital encounter of 09/23/22 (from the past 48 hour(s))  Resp panel by RT-PCR (RSV, Flu A&B, Covid) Anterior Nasal Swab     Status: None   Collection Time: 09/23/22  1:31 PM   Specimen: Anterior Nasal Swab  Result Value Ref Range   SARS Coronavirus 2 by RT PCR NEGATIVE NEGATIVE    Comment: (NOTE) SARS-CoV-2 target nucleic  acids are NOT DETECTED.  The SARS-CoV-2 RNA is generally detectable in upper respiratory specimens during the acute phase of infection. The lowest concentration of SARS-CoV-2 viral copies this assay can detect is 138 copies/mL. A negative result does not preclude SARS-Cov-2 infection and should not be used as the sole basis for treatment or other patient management decisions. A negative result may occur with  improper specimen collection/handling, submission of specimen other than nasopharyngeal swab, presence of viral mutation(s) within the areas targeted by this assay, and inadequate number of viral copies(<138 copies/mL). A negative result must be combined with clinical observations, patient history, and epidemiological information. The expected result is Negative.  Fact Sheet for Patients:  EntrepreneurPulse.com.au  Fact Sheet for Healthcare  Providers:  IncredibleEmployment.be  This test is no t yet approved or cleared by the [place] and  has been authorized for detection and/or diagnosis of SARS-CoV-2 by FDA under an Emergency Use Authorization (EUA). This EUA will remain  in effect (meaning this test can be used) for the duration of the COVID-19 declaration under Section 564(b)(1) of the Act, 21 U.S.C.section 360bbb-3(b)(1), unless the authorization is terminated  or revoked sooner.       Influenza A by PCR NEGATIVE NEGATIVE   Influenza B by PCR NEGATIVE NEGATIVE    Comment: (NOTE) The Xpert Xpress SARS-CoV-2/FLU/RSV plus assay is intended as an aid in the diagnosis of influenza from Nasopharyngeal swab specimens and should not be used as a sole basis for treatment. Nasal washings and aspirates are unacceptable for Xpert Xpress SARS-CoV-2/FLU/RSV testing.  Fact Sheet for Patients: EntrepreneurPulse.com.au  Fact Sheet for Healthcare Providers: IncredibleEmployment.be  This test is not  yet approved or cleared by the [place] FDA and has been authorized for detection and/or diagnosis of SARS-CoV-2 by FDA under an Emergency Use Authorization (EUA). This EUA will remain in effect (meaning this test can be used) for the duration of the COVID-19 declaration under Section 564(b)(1) of the Act, 21 U.S.C. section 360bbb-3(b)(1), unless the authorization is terminated or revoked.     Resp Syncytial Virus by PCR NEGATIVE NEGATIVE    Comment: (NOTE) Fact Sheet for Patients: EntrepreneurPulse.com.au  Fact Sheet for Healthcare Providers: IncredibleEmployment.be  This test is not yet approved or cleared by the [place] FDA and has been authorized for detection and/or diagnosis of SARS-CoV-2 by FDA under an Emergency Use Authorization (EUA). This EUA will remain in effect (meaning this test can be used) for the duration of the COVID-19 declaration under Section 564(b)(1) of the Act, 21 U.S.C. section 360bbb-3(b)(1), unless the authorization is terminated or revoked.  Performed at [facility], [place] [address], [place], [place] [address]   Comprehensive metabolic panel     Status: Abnormal   Collection Time: 09/23/22  1:31 PM  Result Value Ref Range   Sodium 139 135 - 145 mmol/L   Potassium 3.1 (L) 3.5 - 5.1 mmol/L   Chloride 98 98 - 111 mmol/L   CO2 25 22 - 32 mmol/L   Glucose, Bld 115 (H) 70 - 99 mg/dL    Comment: Glucose reference range applies only to samples taken after fasting for at least 8 hours.   BUN 10 6 - 20 mg/dL   Creatinine, Ser 0.86 0.44 - 1.00 mg/dL   Calcium 8.7 (L) 8.9 - 10.3 mg/dL   Total Protein 6.8 6.5 - 8.1 g/dL   Albumin 3.1 (L) 3.5 - 5.0 g/dL   AST 33 15 - 41 U/L   ALT 19 0 - 44 U/L   Alkaline Phosphatase 79 38 - 126 U/L   Total Bilirubin 0.9 0.3 - 1.2 mg/dL   GFR, Estimated >60 >60 mL/min    Comment: (NOTE) Calculated using the CKD-EPI Creatinine Equation (2021)    Anion gap  16 (H) 5 - 15    Comment: Performed at [facility], [place] [address] [place], [place] [address]  CBC with Differential     Status: Abnormal   Collection Time: 09/23/22  1:31 PM  Result Value Ref Range   WBC 10.9 (H) 4.0 - 10.5 K/uL   RBC 4.43 3.87 - 5.11 MIL/uL   Hemoglobin 13.1 12.0 - 15.0 g/dL   HCT 41.5 36.0 - 46.0 %  MCV 93.7 80.0 - 100.0 fL   MCH 29.6 26.0 - 34.0 pg   MCHC 31.6 30.0 - 36.0 g/dL   RDW 15.1 11.5 - 15.5 %   Platelets 395 150 - 400 K/uL   nRBC 0.0 0.0 - 0.2 %   Neutrophils Relative % 73 %   Neutro Abs 7.9 (H) 1.7 - 7.7 K/uL   Lymphocytes Relative 18 %   Lymphs Abs 2.0 0.7 - 4.0 K/uL   Monocytes Relative 5 %   Monocytes Absolute 0.6 0.1 - 1.0 K/uL   Eosinophils Relative 3 %   Eosinophils Absolute 0.4 0.0 - 0.5 K/uL   Basophils Relative 1 %   Basophils Absolute 0.1 0.0 - 0.1 K/uL   Immature Granulocytes 0 %   Abs Immature Granulocytes 0.04 0.00 - 0.07 K/uL    Comment: Performed at [facility], [place] [address], [place], [place] [address]  Urinalysis, Routine w reflex microscopic Urine, Clean Catch     Status: Abnormal   Collection Time: 09/23/22 10:55 PM  Result Value Ref Range   Color, Urine AMBER (A) YELLOW    Comment: BIOCHEMICALS MAY BE AFFECTED BY COLOR   APPearance CLOUDY (A) CLEAR   Specific Gravity, Urine 1.024 1.005 - 1.030   pH 5.0 5.0 - 8.0   Glucose, UA NEGATIVE NEGATIVE mg/dL   Hgb urine dipstick NEGATIVE NEGATIVE   Bilirubin Urine NEGATIVE NEGATIVE   Ketones, ur 5 (A) NEGATIVE mg/dL   Protein, ur 30 (A) NEGATIVE mg/dL   Nitrite POSITIVE (A) NEGATIVE   Leukocytes,Ua SMALL (A) NEGATIVE   RBC / HPF 6-10 0 - 5 RBC/hpf   WBC, UA 11-20 0 - 5 WBC/hpf   Bacteria, UA MANY (A) NONE SEEN   Squamous Epithelial / HPF >50 (H) 0 - 5 /HPF   Hyaline Casts, UA PRESENT     Comment: Performed at [facility], [address] [place], [place] [address]  Vitamin B12     Status: Abnormal   Collection Time: 09/23/22  11:06 PM  Result Value Ref Range   Vitamin B-12 1,809 (H) 180 - 914 pg/mL    Comment: (NOTE) This assay is not validated for testing neonatal or myeloproliferative syndrome specimens for Vitamin B12 levels. Performed at [facility], [place] [address] [place], [place] [address]   TSH     Status: None   Collection Time: 09/23/22 11:06 PM  Result Value Ref Range   TSH 3.882 0.350 - 4.500 uIU/mL    Comment: Performed by a 3rd Generation assay with a functional sensitivity of <=0.01 uIU/mL. Performed at [facility], [place] [address], [place], [place] [address]   Folate     Status: None   Collection Time: 09/23/22 11:18 PM  Result Value Ref Range   Folate 6.6 >5.9 ng/mL    Comment: Performed at [facility] [address], [place], [place] [address]  Sedimentation rate     Status: Abnormal   Collection Time: 09/23/22 11:18 PM  Result Value Ref Range   Sed Rate 38 (H) 0 - 22 mm/hr    Comment: Performed at [facility] [address], [place], [place] [address]  C-reactive protein     Status: Abnormal   Collection Time: 09/23/22 11:18 PM  Result Value Ref Range   CRP 5.9 (H) <1.0 mg/dL    Comment: Performed at [facility] [address], [place], [place] [address]  Basic metabolic panel  Status: Abnormal   Collection Time: 09/23/22 11:18 PM  Result Value Ref Range   Sodium 139 135 - 145 mmol/L   Potassium 3.1 (L) 3.5 - 5.1 mmol/L   Chloride 101 98 - 111 mmol/L   CO2 26 22 - 32 mmol/L   Glucose, Bld 99 70 - 99 mg/dL    Comment: Glucose reference range applies only to samples taken after fasting for at least 8 hours.   BUN 7 6 - 20 mg/dL   Creatinine, Ser 0.87 0.44 - 1.00 mg/dL   Calcium 8.4 (L) 8.9 - 10.3 mg/dL   GFR, Estimated >60 >60 mL/min    Comment: (NOTE) Calculated using the CKD-EPI Creatinine Equation (2021)    Anion gap 12 5 - 15    Comment: Performed at [facility] [address], [place], [place] [address]  Lactic acid,  plasma     Status: None   Collection Time: 09/23/22 11:18 PM  Result Value Ref Range   Lactic Acid, Venous 1.2 0.5 - 1.9 mmol/L    Comment: Performed at [facility] [address] [place], [place] [address]  Magnesium     Status: Abnormal   Collection Time: 09/23/22 11:18 PM  Result Value Ref Range   Magnesium 1.5 (L) 1.7 - 2.4 mg/dL    Comment: Performed at [facility] [address], [place], [place] [address]  Phosphorus     Status: None   Collection Time: 09/23/22 11:18 PM  Result Value Ref Range   Phosphorus 3.6 2.5 - 4.6 mg/dL    Comment: Performed at [facility] [address], [place], [place] [address]  Protime-INR     Status: None   Collection Time: 09/23/22 11:18 PM  Result Value Ref Range   Prothrombin Time 12.7 11.4 - 15.2 seconds   INR 1.0 0.8 - 1.2    Comment: (NOTE) INR goal varies based on device and disease states. Performed at [facility], [place] [address] [place], [place] [address]   Prealbumin     Status: Abnormal   Collection Time: 09/24/22  4:10 AM  Result Value Ref Range   Prealbumin 12 (L) 18 - 38 mg/dL    Comment: Performed at [facility] [address], [place], [place] [address]  CBC     Status: Abnormal   Collection Time: 09/24/22  4:10 AM  Result Value Ref Range   WBC 10.1 4.0 - 10.5 K/uL   RBC 3.84 (L) 3.87 - 5.11 MIL/uL   Hemoglobin 11.5 (L) 12.0 - 15.0 g/dL   HCT 35.8 (L) 36.0 - 46.0 %   MCV 93.2 80.0 - 100.0 fL   MCH 29.9 26.0 - 34.0 pg   MCHC 32.1 30.0 - 36.0 g/dL   RDW 14.9 11.5 - 15.5 %   Platelets 342 150 - 400 K/uL   nRBC 0.0 0.0 - 0.2 %    Comment: Performed at [facility], [place] [address] [place], [place] [address]  CBC with Differential/Platelet     Status: Abnormal   Collection Time: 09/24/22 10:01 AM  Result Value Ref Range   WBC 8.1 4.0 - 10.5 K/uL   RBC 3.96 3.87 - 5.11 MIL/uL   Hemoglobin 11.9 (L) 12.0 - 15.0 g/dL   HCT 37.7 36.0 - 46.0 %   MCV 95.2 80.0 - 100.0 fL   MCH  30.1 26.0 - 34.0 pg   MCHC 31.6 30.0 - 36.0 g/dL  RDW 15.1 11.5 - 15.5 %   Platelets 324 150 - 400 K/uL   nRBC 0.0 0.0 - 0.2 %   Neutrophils Relative % 72 %   Neutro Abs 5.8 1.7 - 7.7 K/uL   Lymphocytes Relative 16 %   Lymphs Abs 1.3 0.7 - 4.0 K/uL   Monocytes Relative 8 %   Monocytes Absolute 0.7 0.1 - 1.0 K/uL   Eosinophils Relative 4 %   Eosinophils Absolute 0.3 0.0 - 0.5 K/uL   Basophils Relative 0 %   Basophils Absolute 0.0 0.0 - 0.1 K/uL   Immature Granulocytes 0 %   Abs Immature Granulocytes 0.03 0.00 - 0.07 K/uL    Comment: Performed at [facility] [address] [place], [place] [address]  Basic metabolic panel     Status: Abnormal   Collection Time: 09/24/22 10:01 AM  Result Value Ref Range   Sodium 138 135 - 145 mmol/L   Potassium 3.5 3.5 - 5.1 mmol/L   Chloride 104 98 - 111 mmol/L   CO2 25 22 - 32 mmol/L   Glucose, Bld 125 (H) 70 - 99 mg/dL    Comment: Glucose reference range applies only to samples taken after fasting for at least 8 hours.   BUN 6 6 - 20 mg/dL   Creatinine, Ser 0.73 0.44 - 1.00 mg/dL   Calcium 8.1 (L) 8.9 - 10.3 mg/dL   GFR, Estimated >60 >60 mL/min    Comment: (NOTE) Calculated using the CKD-EPI Creatinine Equation (2021)    Anion gap 9 5 - 15    Comment: Performed at [facility] [address], [place], [place] [address]  Magnesium     Status: None   Collection Time: 09/24/22 10:01 AM  Result Value Ref Range   Magnesium 1.9 1.7 - 2.4 mg/dL    Comment: Performed at [facility] [address] [place], [place] [address]   MR Lumbar Spine W Wo Contrast  Result Date: 09/23/2022 CLINICAL DATA:  Initial evaluation for ataxia, weakness. EXAM: MRI LUMBAR SPINE WITHOUT AND WITH CONTRAST TECHNIQUE: Multiplanar and multiecho pulse sequences of the lumbar spine were obtained without and with intravenous contrast. CONTRAST:  66m GADAVIST GADOBUTROL 1 MMOL/ML IV SOLN COMPARISON:  Prior study from 04/16/2018. FINDINGS:  Segmentation:  Examination moderately degraded by motion artifact. Standard segmentation. Lowest well-formed disc space labeled the L5-S1 level. Alignment: Physiologic with preservation of the normal lumbar lordosis. No significant listhesis. Vertebrae: Vertebral body height maintained without acute or chronic fracture. Bone marrow signal intensity diffusely heterogeneous without worrisome osseous lesion. Discogenic reactive endplate changes present about the L1-2 and L2-3 interspaces. No other abnormal marrow edema or enhancement. Conus medullaris and cauda equina: Conus extends to the L1 level. Conus and cauda equina appear normal. Paraspinal and other soft tissues: Paraspinous soft tissues demonstrate no acute finding. Asymmetric atrophy involving the left psoas muscle noted. Disc levels: L1-2: Disc bulge with disc desiccation. Superimposed left subarticular disc extrusion with inferior migration (series 22, image 11). Mild facet hypertrophy. Mild epidural lipomatosis. Resultant moderate canal with severe left lateral recess stenosis. Moderate left L1 foraminal narrowing. Right neural foramen remains patent. L2-3: Advanced degenerative intervertebral disc space narrowing with disc desiccation and diffuse disc bulge. Associated reactive endplate spurring. Prior left hemi laminectomy. Moderate facet hypertrophy. Mild epidural lipomatosis. Residual mild canal with bilateral subarticular stenosis. Mild-to-moderate bilateral foraminal narrowing. L3-4: Mild diffuse disc bulge with disc desiccation. Central annular fissure. Moderate bilateral facet and ligament flavum hypertrophy with  associated small joint effusions. Epidural lipomatosis. Resultant moderate spinal stenosis. Mild to moderate bilateral L3 foraminal narrowing. L4-5: Mild disc bulge with disc desiccation. Moderate bilateral facet arthrosis with small joint effusions. Epidural lipomatosis. Secondary compression of the distal thecal sac with moderate to  severe spinal stenosis. Foramina remain patent. L5-S1: Disc desiccation with mild disc bulge. Mild bilateral facet hypertrophy. Epidural lipomatosis. No significant spinal stenosis. Foramina remain patent. IMPRESSION: 1. Left subarticular disc extrusion with inferior migration at L1-2 with resultant moderate canal and severe left lateral recess stenosis, with moderate left L1 foraminal narrowing. 2. Multifactorial degenerative changes at L3-4 with resultant moderate spinal stenosis, with mild to moderate bilateral L3 foraminal narrowing. 3. Epidural lipomatosis with resultant moderate to severe spinal stenosis at L4-5. Electronically Signed   By: Jeannine Boga M.D.   On: 09/23/2022 22:59   MR THORACIC SPINE W WO CONTRAST  Result Date: 09/23/2022 CLINICAL DATA:  Initial evaluation for ataxia, weakness. EXAM: MRI THORACIC WITHOUT AND WITH CONTRAST TECHNIQUE: Multiplanar and multiecho pulse sequences of the thoracic spine were obtained without and with intravenous contrast. CONTRAST:  62m GADAVIST GADOBUTROL 1 MMOL/ML IV SOLN COMPARISON:  None Available. FINDINGS: Alignment:  Examination moderately degraded by motion artifact. Vertebral bodies normally aligned with preservation of the normal thoracic kyphosis. No listhesis. Vertebrae: Vertebral body height maintained without acute or chronic fracture. Bone marrow signal intensity diffusely heterogeneous without worrisome osseous lesion. Scattered discogenic reactive endplate changes present throughout the thoracic spine. No other abnormal marrow edema or enhancement. Cord:  Normal signal and morphology.  No abnormal enhancement. Paraspinal and other soft tissues: Unremarkable. Disc levels: Moderate degenerative spondylosis with multilevel degenerative disc bulging and reactive endplate spurring present throughout the thoracic spine. Changes are most pronounced within the midthoracic spine at T5-6 through T10-11. No high-grade spinal stenosis by CT. Moderate  bilateral foraminal narrowing present at T10-11. Foramina remain otherwise grossly patent. IMPRESSION: 1. Normal MRI appearance of the thoracic spinal cord. No cord compression or evidence for myelopathy. 2. Moderate degenerative spondylosis throughout the thoracic spine without high-grade spinal stenosis. Moderate bilateral foraminal narrowing at T10-11. Electronically Signed   By: BJeannine BogaM.D.   On: 09/23/2022 22:43   MR Cervical Spine W or Wo Contrast  Result Date: 09/23/2022 CLINICAL DATA:  Initial evaluation for ataxia, weakness. EXAM: MRI CERVICAL SPINE WITHOUT AND WITH CONTRAST TECHNIQUE: Multiplanar and multiecho pulse sequences of the cervical spine, to include the craniocervical junction and cervicothoracic junction, were obtained without and with intravenous contrast. CONTRAST:  16mGADAVIST GADOBUTROL 1 MMOL/ML IV SOLN COMPARISON:  Prior MRI report from 07/15/2016. FINDINGS: Alignment: Examination moderately degraded by motion artifact, limiting assessment. Straightening of the normal cervical lordosis.  No listhesis. Vertebrae: Prior ACDF at C4-C7. Vertebral body height maintained without acute or chronic fracture. Bone marrow signal intensity overall within normal limits. No worrisome osseous lesions. Discogenic reactive endplate changes noted about the T1-2 interspace. No other abnormal marrow edema. Cord: Normal signal and morphology.  No abnormal enhancement. Posterior Fossa, vertebral arteries, paraspinal tissues: Unremarkable. Disc levels: C2-C3: Minimal disc bulge with bilateral uncovertebral spurring. No significant spinal stenosis. Foramina remain patent. C3-C4: Mild disc bulge with bilateral uncovertebral spurring. No significant spinal stenosis. Foramina remain patent. C4-C5: Prior fusion. No residual spinal stenosis. Foramina remain patent. C5-C6: Prior fusion. No residual spinal stenosis. Bilateral uncovertebral spurring with residual moderate left with mild-to-moderate  right C6 foraminal stenosis. C6-C7: Prior fusion. No residual spinal stenosis. Bilateral uncovertebral spurring with residual mild to moderate bilateral C7 foraminal  stenosis. C7-T1: Degenerative intervertebral disc space narrowing with mild disc bulge. Right greater than left uncovertebral spurring. No significant spinal stenosis. Mild right C8 foraminal stenosis. Left neural foramina remains patent. IMPRESSION: 1. Normal MRI appearance of the cervical spinal cord. No cord compression or evidence for myelopathy. 2. Prior ACDF at C4-C7 without residual spinal stenosis. 3. Mild to moderate bilateral C6 and C7 and right C8 foraminal stenosis related to uncovertebral disease. Electronically Signed   By: Jeannine Boga M.D.   On: 09/23/2022 22:32   CT Head Wo Contrast  Result Date: 09/23/2022 CLINICAL DATA:  Headache EXAM: CT HEAD WITHOUT CONTRAST TECHNIQUE: Contiguous axial images were obtained from the base of the skull through the vertex without intravenous contrast. RADIATION DOSE REDUCTION: This exam was performed according to the departmental dose-optimization program which includes automated exposure control, adjustment of the mA and/or kV according to patient size and/or use of iterative reconstruction technique. COMPARISON:  None Available. FINDINGS: Brain: No evidence of acute infarction, hemorrhage, hydrocephalus, extra-axial collection or mass lesion/mass effect. Partially empty sella. Vascular: No hyperdense vessel or unexpected calcification. Skull: Normal. Negative for fracture or focal lesion. Sinuses/Orbits: No acute finding. Other: None. IMPRESSION: No specific etiology for headaches identified. Electronically Signed   By: Marin Roberts M.D.   On: 09/23/2022 17:01   DG Chest Portable 1 View  Result Date: 09/23/2022 CLINICAL DATA:  Numbness all over.  Weakness. EXAM: PORTABLE CHEST 1 VIEW COMPARISON:  04/23/2018. FINDINGS: Normal heart, mediastinum and hila. Clear lungs.  No pleural effusion  or pneumothorax. Skeletal structures are grossly intact. Stable changes from a previous anterior cervical spine fusion. IMPRESSION: No active disease. Electronically Signed   By: Lajean Manes M.D.   On: 09/23/2022 13:22    Pending Labs Unresulted Labs (From admission, onward)     Start     Ordered   09/24/22 0517  IgG CSF index  (IgG CSF Index, CSF + Serum panel)  Once,   R        09/24/22 0516   09/24/22 0517  Draw extra clot tube  (IgG CSF Index, CSF + Serum panel)  Once,   R        09/24/22 0516   09/24/22 0514  Vitamin E  Once,   R        09/24/22 0513   09/24/22 0514  Copper, serum  Once,   R        09/24/22 0513   09/24/22 0513  Miscellaneous [facility] test (send-out)  Once,   R       Question:  Test name / description:  Answer:  Anti-MOG antibody titer. Anti GM1 titer. Anti GQ1b titer.   09/24/22 0513   09/24/22 0100  Urine Culture  (Urine Culture)  Once,   R       Question:  Indication  Answer:  Altered mental status (if no other cause identified)   09/24/22 0100   09/23/22 2038  Lactic acid, plasma  STAT Now then every 3 hours,   R     Question:  Release to patient  Answer:  Immediate   09/23/22 2037   09/23/22 2038  Aldolase  Once,   URGENT        09/23/22 2037   Signed and Held  Comprehensive metabolic panel  Tomorrow morning,   R       Question:  Release to patient  Answer:  Immediate   Signed and Held   Signed and Held  Magnesium  Tomorrow  morning,   R        Signed and Held   Signed and Held  Phosphorus  Tomorrow morning,   R        Signed and Held            Vitals/Pain Today's Vitals   09/24/22 0430 09/24/22 0615 09/24/22 0958 09/24/22 1024  BP: 138/78 (!) 152/91 (!) 153/92   Pulse: 90 86    Resp:   16   Temp:    97.7 F (36.5 C)  TempSrc:   Oral Oral  SpO2: 95% 98% 95%   Weight:      Height:      PainSc:   0-No pain     Isolation Precautions No active isolations  Medications Medications  cyclobenzaprine (FLEXERIL) tablet 10 mg (10 mg Oral  Given 05/20/69 9628)  folic acid (FOLVITE) tablet 1 mg (1 mg Oral Given 09/24/22 0839)  gabapentin (NEURONTIN) capsule 300 mg (300 mg Oral Given 09/24/22 0840)  HYDROmorphone (DILAUDID) tablet 4 mg (4 mg Oral Given 09/24/22 0001)  latanoprost (XALATAN) 0.005 % ophthalmic solution 1 drop (has no administration in time range)  pantoprazole (PROTONIX) EC tablet 40 mg (40 mg Oral Given 09/24/22 0839)  0.9 %  sodium chloride infusion ( Intravenous New Bag/Given 09/24/22 0022)  albuterol (PROVENTIL) (2.5 MG/3ML) 0.083% nebulizer solution 3 mL (has no administration in time range)  cefTRIAXone (ROCEPHIN) 1 g in sodium chloride 0.9 % 100 mL IVPB (0 g Intravenous Stopped 09/24/22 0158)  hydrALAZINE (APRESOLINE) injection 10 mg (has no administration in time range)  potassium chloride SA (KLOR-CON M) CR tablet 40 mEq (40 mEq Oral Given 09/23/22 1626)  sodium chloride 0.9 % bolus 1,000 mL (0 mLs Intravenous Stopped 09/23/22 1942)  LORazepam (ATIVAN) injection 2 mg (2 mg Intravenous Given 09/23/22 1945)  gadobutrol (GADAVIST) 1 MMOL/ML injection 10 mL (10 mLs Intravenous Contrast Given 09/23/22 2145)  potassium chloride 10 mEq in 100 mL IVPB (0 mEq Intravenous Stopped 09/24/22 0425)  potassium chloride SA (KLOR-CON M) CR tablet 40 mEq (40 mEq Oral Given 09/24/22 0119)  magnesium sulfate IVPB 2 g 50 mL (0 g Intravenous Stopped 09/24/22 0315)    Mobility non-ambulatory Low fall risk   Focused Assessments Neuro Assessment Handoff:  Swallow screen pass? Yes Patient is scheduled for LP @ 1300   NIH Stroke Scale  Interval: Shift assessment Level of Consciousness (1a.)   : Alert, keenly responsive LOC Questions (1b. )   : Answers both questions correctly LOC Commands (1c. )   : Performs both tasks correctly Best Gaze (2. )  : Normal Visual (3. )  : No visual loss Facial Palsy (4. )    : Normal symmetrical movements Motor Arm, Left (5a. )   : No drift Motor Arm, Right (5b. ) : No drift Motor Leg, Left (6a. )  : No  drift Motor Leg, Right (6b. ) : No drift Limb Ataxia (7. ): Absent Sensory (8. )  : Normal, no sensory loss Best Language (9. )  : No aphasia Dysarthria (10. ): Normal Extinction/Inattention (11.)   : No Abnormality DO NOT USE: Modified SS Total : 0 Complete NIHSS TOTAL: 0     Neuro Assessment: Within Defined Limits Neuro Checks:   Initial (09/23/22 1440)  Has TPA been given? No If patient is a Neuro Trauma and patient is going to OR before floor call report to [place] nurse: [phone] or [phone]   R Recommendations: See Admitting Provider Note  Report given to:   Additional Notes: Pt is suppose to go to [facility] @ 1300 for the LP procedure. Can call Otila Kluver [phone]. And also, needs a gold top tube blood drawn after LP procedure complete.

## 2022-09-24 NOTE — Assessment & Plan Note (Signed)
Will replace ?

## 2022-09-24 NOTE — Progress Notes (Signed)
Noted unsuccessful LP. I will discuss with radiology tomorrow regarding possible re-attempt, there may be a target at L5-S1 when looking at MRI, but will discuss again with radiology tomorrow. If unable to obtain CSF, then would favor empiric treatment starting tomorrow with IVIG given characteristic clinical presentation. I have discussed this with patient and husband.   Roland Rack, MD [facility] [phone]  If 7pm- 7am, please page neurology on call as listed in [facility].

## 2022-09-24 NOTE — Evaluation (Signed)
Physical Therapy Evaluation Patient Details Name: Dana [name] MRN: [id number] DOB: 02-16-1966 Today's Date: 09/24/2022  History of Present Illness  Pt is a 57 y/o female who presented on 09/23/22 with progressive weakness and sensation changes. Symptoms worsening since 08/23/22. MRI negative, pending LP for possible GBS. PMH: anxiety, DJD, fibromyalgia, HTN, tobacco use.  Clinical Impression  Pt admitted with above diagnosis. At baseline pt is independent.  She does report having chronic pain and does not work, but is able to mobilize with rollator or cane in community.  Pt has had progressive weakness with falls since early December - began in legs and now affecting arms.  Today, pt presents with weakness throughout extremities (worse L LE compared to R LE), significantly decreased sensation and proprioception in extremities, and decreased mobility.  She was fearful of fall and very anxious/nervous regarding her current mobility.  She required mod A to transfer to EOB and could maintain balance.  Did not attempt standing due to stretcher set up in ED (feet not touching floor), decreased proprioception and sensations, and pt's fear of falling.  Could benefit from [facility] for first attempt to stand.  Pt is well below her baseline and would benefit from therapy to progress. Pt currently with functional limitations due to the deficits listed below (see PT Problem List). Pt will benefit from skilled PT to increase their independence and safety with mobility to allow discharge to the venue listed below.          Recommendations for follow up therapy are one component of a multi-disciplinary discharge planning process, led by the attending physician.  Recommendations may be updated based on patient status, additional functional criteria and insurance authorization.  Follow Up Recommendations Acute inpatient rehab (3hours/day)      Assistance Recommended at Discharge Frequent or constant  Supervision/Assistance  Patient can return home with the following  Two people to help with walking and/or transfers;Two people to help with bathing/dressing/bathroom    Equipment Recommendations Other (comment) (TBD)  Recommendations for Other Services  Rehab consult    Functional Status Assessment Patient has had a recent decline in their functional status and demonstrates the ability to make significant improvements in function in a reasonable and predictable amount of time.     Precautions / Restrictions Precautions Precautions: Fall Restrictions Weight Bearing Restrictions: No      Mobility  Bed Mobility Overal bed mobility: Needs Assistance Bed Mobility: Supine to Sit, Sit to Supine     Supine to sit: Mod assist Sit to supine: Min assist   General bed mobility comments: Assist with LLE to EOB and to scoot hips. Min A for LLE back onto stretcher    Transfers Overall transfer level: Needs assistance                 General transfer comment: deferred with stretcher setup and pt fear of falling and numbness/lack of proprioception    Ambulation/Gait                  Stairs            Wheelchair Mobility    Modified Rankin (Stroke Patients Only)       Balance Overall balance assessment: Needs assistance Sitting-balance support: No upper extremity supported, Feet unsupported Sitting balance-Leahy Scale: Fair         Standing balance comment: deferred  Pertinent Vitals/Pain Pain Assessment Pain Assessment: Faces Faces Pain Scale: Hurts a little bit Pain Location: constant generalized pain that is chronic Pain Descriptors / Indicators: Aching Pain Intervention(s): Limited activity within patient's tolerance, Monitored during session    Home Living Family/patient expects to be discharged to:: Private residence Living Arrangements: Spouse/significant other;Other (Comment) (mother in  law) Available Help at Discharge: Family;Available PRN/intermittently Type of Home: House Home Access: Ramped entrance       Home Layout: One level Home Equipment: Rollator (4 wheels)      Prior Function Prior Level of Function : Independent/Modified Independent;Driving             Mobility Comments: Progressive weakness since early December but prior to that was completely independent with AD.  Did not work prior due to on disability.  At baseline uses cane outside and rollator in home. ADLs Comments: Reports independent adls and iadls, likes to go shopping with sister     Hand Dominance   Dominant Hand: Right    Extremity/Trunk Assessment   Upper Extremity Assessment Upper Extremity Assessment: Defer to OT evaluation RUE Deficits / Details: impaired sensation grossly, still able to functionally hold objects and demo fair strength    Lower Extremity Assessment Lower Extremity Assessment: LLE deficits/detail;RLE deficits/detail RLE Deficits / Details: ROM WFL; MMT: hip 3/5, ankle and knee 4-/5; Sensation: Pt initially reports diminished and then reports numb.  When tested could feel some on thigh but diminished and no other sensation.  Reports buttock and genitals also numb.  No prioprioception RLE Sensation: decreased light touch LLE Deficits / Details: ROM WFL; MMT: hip 1/5, ankle and knee 3/5; Sensation: Pt initially reports diminished and then reports numb. When tested could feel some on thigh but diminished and no other sensation. Reports buttock and genitals also numb. No prioprioception LLE Sensation: decreased light touch    Cervical / Trunk Assessment Cervical / Trunk Assessment: Other exceptions Cervical / Trunk Exceptions: reports trunk region numb  Communication   Communication: No difficulties  Cognition Arousal/Alertness: Awake/alert Behavior During Therapy: Anxious Overall Cognitive Status: Within Functional Limits for tasks assessed                                  General Comments: anxious with prospect of mobility but responded well to encouragement/calming        General Comments General comments (skin integrity, edema, etc.): VSS    Exercises     Assessment/Plan    PT Assessment Patient needs continued PT services  PT Problem List Decreased strength;Decreased mobility;Decreased knowledge of precautions;Decreased activity tolerance;Decreased balance;Decreased knowledge of use of DME;Other (comment);Impaired sensation (decreased proprioception)       PT Treatment Interventions DME instruction;Therapeutic exercise;Gait training;Balance training;Neuromuscular re-education;Modalities;Functional mobility training;Therapeutic activities;Patient/family education    PT Goals (Current goals can be found in the Care Plan section)  Acute Rehab PT Goals Patient Stated Goal: regain her strength PT Goal Formulation: With patient Time For Goal Achievement: 10/08/22 Potential to Achieve Goals: Good    Frequency Min 4X/week     Co-evaluation PT/OT/SLP Co-Evaluation/Treatment: Yes Reason for Co-Treatment: Complexity of the patient's impairments (multi-system involvement);For patient/therapist safety PT goals addressed during session: Mobility/safety with mobility;Strengthening/ROM OT goals addressed during session: ADL's and self-care;Strengthening/ROM       AM-PAC PT "6 Clicks" Mobility  Outcome Measure Help needed turning from your back to your side while in a flat bed without using bedrails?: A Little  Help needed moving from lying on your back to sitting on the side of a flat bed without using bedrails?: A Lot Help needed moving to and from a bed to a chair (including a wheelchair)?: Total Help needed standing up from a chair using your arms (e.g., wheelchair or bedside chair)?: Total Help needed to walk in hospital room?: Total Help needed climbing 3-5 steps with a railing? : Total 6 Click Score: 9    End of  Session   Activity Tolerance: Patient tolerated treatment well Patient left: in bed;with call bell/phone within reach Nurse Communication: Mobility status PT Visit Diagnosis: Other abnormalities of gait and mobility (R26.89);Muscle weakness (generalized) (M62.81)    Time: 1101-1130 PT Time Calculation (min) (ACUTE ONLY): 29 min   Charges:   PT Evaluation $PT Eval Moderate Complexity: 1 Mod          Keshona Kartes, PT Acute Rehab [facility] [phone]   Karlton Lemon 09/24/2022, 1:47 PM

## 2022-09-24 NOTE — Progress Notes (Signed)
Unsuccessful LP attempt by Dr. Candise Che and Rushie Nyhan NP at L4/5 and L5/S1. Attempt. Team made aware.

## 2022-09-24 NOTE — Progress Notes (Signed)
With good effort patient has a NIF of -30 and VC of 2.2.

## 2022-09-24 NOTE — Consult Note (Addendum)
NEURO HOSPITALIST CONSULT NOTE   Requestig physician: Dr. Roel Cluck  Reason for Consult: 4 week history of progressive ascending limb weakness  History obtained from:   Patient and Chart     HPI:                                                                                                                                          Dana [name] is an 57 y.o. female with a PMHx of B12 deficiency (on B12 shots), anxiety, back pain, right breas mass, chronic opioid use, DJD, fibromyalgia, headache, hypercholesterolemia, mild hypertension, obesity and tobacco use who presented to the ED on Monday afternoon for evaluation of progressive limb weakness since Dec 8, followed by acute worsening beginning on Thursday, with the weakness ascending to involve her arms. She says she even feels like her neck is now weak.   Of note, she did start to have nausea and vomiting as well as diarrhea back in December. The diarrhea resolved but the vomiting had continued. Her doctor prescribed Zofran which did not work very well. She does not endorse any symptoms of infection at that time.   On initial interview by Triage RN, she complained of weakness and numbness "all over" since 08/23/22 and stated that it was worsening. She reported initial onset of lower extremity weakness that has progressed to involve her upper extremities. She does not endorse any difficulty breathing or incontinence. She reported that when she picks objects up she drops them, in addition to numbness of the entire neck and lips. She has had decreased mobility due to the numbness and weakness. She is unable to bear weight on her lower extremities and fell onto her buttocks Sunday night without striking her head. She now has numbness and tingling involving all 4 of her extremities.   Past Medical History:  Diagnosis Date   Anxiety    Back pain with radiation    Breast mass, right    Chronic pain    Chronic, continuous use of  opioids    Complication of anesthesia 04/07/14   Allergic reaction to Lisinopril immediately following surgery   DJD (degenerative joint disease)    Fibromyalgia    Groin abscess    Headache(784.0)    History of IBS    Hypercholesterolemia    IBS (irritable bowel syndrome)    Lactose intolerance    Mild hypertension    Obesity    Tobacco use disorder    Umbilical hernia    Symptomatic    Past Surgical History:  Procedure Laterality Date   ABDOMINAL HYSTERECTOMY     ANTERIOR CERVICAL DECOMP/DISCECTOMY FUSION N/A 12/20/2015   Procedure: ANTERIOR CERVICAL DECOMPRESSION FUSION CERVICAL 4-5, CERVICAL 5-6, CERVICAL 6-7 WITH INSTRUMENTATION AND ALLOGRAFT;  Surgeon: Phylliss Bob, MD;  Location: [place] [facility];  Service: Orthopedics;  Laterality: N/A;  Anterior cervical decompression fusion, cervical 4-5, cervical 5-6, cervical 6-7 with instrumentation and allograft   BACK SURGERY     BILATERAL SALPINGECTOMY  09/03/2012   Procedure: BILATERAL SALPINGECTOMY;  Surgeon: [name], MD;  Location: [facility];  Service: Gynecology;  Laterality: Bilateral;   BREAST BIOPSY Right 04/07/2014   Procedure: REMOVAL RIGHT BREAST MASS WITH WIRE LOCALIZATION;  Surgeon: [name], MD;  Location: [place];  Service: General;  Laterality: Right;   BREAST EXCISIONAL BIOPSY Left    x2   BREAST LUMPECTOMY     x2   CARPAL TUNNEL RELEASE Left 05/11/2019   Procedure: LEFT CARPAL TUNNEL RELEASE, RIGHT TENNIS ELBOW MARCAINE/DEPO MEDROL INJECTION UNDER ANESTHESIA;  Surgeon: Jessy Oto, MD;  Location: [place];  Service: Orthopedics;  Laterality: Left;   COLONOSCOPY W/ BIOPSIES  04/24/2012   per Dr. Carlean Purl, clear, repeat in 10 yrs    disectomy     ESOPHAGOGASTRODUODENOSCOPY     FINGER SURGERY     Right index-excision of mass    FOOT SURGERY Right    Bone Spurs   LAPAROSCOPIC HYSTERECTOMY  09/03/2012   Procedure: HYSTERECTOMY TOTAL LAPAROSCOPIC;  Surgeon: [name], MD;  Location: [facility];  Service:  Gynecology;  Laterality: N/A;   LUMBAR DISC SURGERY     TUBAL LIGATION     UMBILICAL HERNIA REPAIR N/A 05/01/2018   Procedure: UMBILICAL HERNIA REPAIR;  Surgeon: Judeth Horn, MD;  Location: [facility];  Service: General;  Laterality: N/A;    Family History  Problem Relation Age of Onset   Diabetes Father    Diabetes Mother    Prostate cancer Paternal Grandfather    Breast cancer Maternal Aunt 46             Social History:  reports that she has quit smoking. Her smoking use included cigarettes. She has never used smokeless tobacco. She reports that she does not currently use alcohol. She reports current drug use.  Allergies  Allergen Reactions   Lisinopril Anaphylaxis    was hospitalized for 3 days   Codeine Hives   Hydrocodone Hives   Penicillins Itching and Swelling    PATIENT HAS HAD A PCN REACTION WITH IMMEDIATE RASH, FACIAL/TONGUE/THROAT SWELLING, SOB, OR LIGHTHEADEDNESS WITH HYPOTENSION:  #  #  YES  #  #  Has patient had a PCN reaction causing severe rash involving mucus membranes or skin necrosis: No Has patient had a PCN reaction that required hospitalization No Has patient had a PCN reaction occurring within the last 10 years: No If all of the above answers are "NO", then may proceed with Cephalosporin use.    Amlodipine Swelling   Acetaminophen Rash    MEDICATIONS:                                                                                                                     No current facility-administered medications on file prior to encounter.  Current Outpatient Medications on File Prior to Encounter  Medication Sig Dispense Refill   albuterol (VENTOLIN HFA) 108 (90 Base) MCG/ACT inhaler Inhale 2 puffs into the lungs every 6 (six) hours as needed for wheezing or shortness of breath. 1 each 3   ALPRAZolam (XANAX) 0.5 MG tablet TAKE 1 TABLET (0.5 MG TOTAL) BY MOUTH 3 (THREE) TIMES DAILY AS NEEDED FOR ANXIETY. 90 tablet 5   benzonatate (TESSALON PERLES) 100 MG  capsule 1-2 capsules up to twice daily as needed for cough 30 capsule 0   cyanocobalamin (VITAMIN B12) 1000 MCG/ML injection Inject 1 mL (1,000 mcg total) into the muscle once a week. (Patient taking differently: Inject 1,000 mcg into the muscle once a week. Thursday) 10 mL 11   cyclobenzaprine (FLEXERIL) 10 MG tablet TAKE 1 TABLET BY MOUTH EVERYDAY AT BEDTIME (Patient taking differently: Take 10 mg by mouth at bedtime.) 90 tablet 3   dorzolamide-timolol (COSOPT) 2-0.5 % ophthalmic solution 1 drop 2 (two) times daily.     famotidine (PEPCID) 20 MG tablet Take 1 tablet (20 mg total) by mouth 2 (two) times daily. 60 tablet 5   fluticasone (FLONASE) 50 MCG/ACT nasal spray SPRAY 2 SPRAYS INTO EACH NOSTRIL EVERY DAY (Patient taking differently: Place 2 sprays into both nostrils daily as needed for allergies.) 16 mL 11   folic acid (FOLVITE) 1 MG tablet Take 1 tablet (1 mg total) by mouth daily. 90 tablet 3   furosemide (LASIX) 40 MG tablet Take 1 tablet (40 mg total) by mouth in the morning and at bedtime. 180 tablet 3   gabapentin (NEURONTIN) 300 MG capsule Take 1 capsule (300 mg total) by mouth 3 (three) times daily. 90 capsule 5   HYDROmorphone (DILAUDID) 4 MG tablet Take 1 tablet (4 mg total) by mouth every 6 (six) hours as needed for severe pain. 120 tablet 0   latanoprost (XALATAN) 0.005 % ophthalmic solution 1 drop at bedtime.     metoprolol tartrate (LOPRESSOR) 50 MG tablet Take 1 tablet (50 mg total) by mouth 2 (two) times daily. 180 tablet 3   pantoprazole (PROTONIX) 40 MG tablet Take 40 mg by mouth daily as needed (for acid reflux).     potassium chloride (KLOR-CON 10) 10 MEQ tablet Take 1 tablet (10 mEq total) by mouth 2 (two) times daily. 180 tablet 3   promethazine (PHENERGAN) 25 MG tablet Take 1 tablet (25 mg total) by mouth every 4 (four) hours as needed for nausea or vomiting. 60 tablet 5   SYRINGE-NEEDLE, DISP, 3 ML (LUER LOCK SAFETY SYRINGES) 25G X 1" 3 ML MISC 1 Application by Does not  apply route once a week. 50 each 5   temazepam (RESTORIL) 30 MG capsule Take 1 capsule (30 mg total) by mouth at bedtime as needed for sleep. (Patient not taking: Reported on 09/23/2022) 30 capsule 5   Scheduled:  folic acid  1 mg Oral Daily   gabapentin  300 mg Oral TID   latanoprost  1 drop Both Eyes QHS   pantoprazole  40 mg Oral Daily   Continuous:  sodium chloride 75 mL/hr at 09/24/22 0022   cefTRIAXone (ROCEPHIN)  IV Stopped (09/24/22 0158)     ROS:  As per HPI.    Blood pressure (!) 157/93, pulse 93, temperature 98.6 F (37 C), temperature source Oral, resp. rate 16, height '5\' 4"'$  (1.626 m), weight 125.1 kg, last menstrual period 08/17/2012, SpO2 96 %.   General Examination:                                                                                                       Physical Exam  General: Morbid obesity HEENT-  [place]/AT    Lungs- Respirations unlabored Extremities- No edema  Neurological Examination Mental Status:  Alert, oriented, thought content appropriate.  Speech fluent without evidence of aphasia.  Able to follow all commands without difficulty. Cranial Nerves: II: Temporal visual fields intact with no extinction to DSS. PERRL.   III,IV, VI: No ptosis. EOMI without nystagmus.  V: Temp sensation equal bilaterally VII: Smile symmetric, facial light touch sensation normal bilaterally VIII: Hearing intact to voice IX,X: No hypophonia or hoarseness XI: Symmetric shoulder shrug XII: Midline tongue extension Motor: RUE 4/5 proximally and distally with equivocal giveway quality on some trials LUE 4/5 proximally and distally with equivocal giveway RLE 3-4/5 hip flexion, 4-/5 knee flexion, 4+/5 knee extension, ADF/APF compromised by dysesthesia/pain with foot stimulation.  LLE 3/5 hip flexion, 4-/5 knee flexion, 4+/5 knee extension,  ADF/APF 4/5 with equivocal giveway quality Sensory:  Decreased temp and FT to BUE from hands to shoulders Decreased temp and FT to BLE, worse on the left.  Absent proprioception to toes and ankles bilaterally.  Deep Tendon Reflexes: 0 bilateral brachioradialis, biceps, triceps, patellae and achilles. Of note, her prominent adiposity does compromise elicitation of reflexes somewhat, but is not expected to result in complete areflexia.  Plantars: Right: downgoing   Left: downgoing Cerebellar: No ataxia with FNF bilaterally. Unable to assess H-S due to weakness.  Gait: Unable to assess   Lab Results: Basic Metabolic Panel: Recent Labs  Lab 09/23/22 1331 09/23/22 2318  NA 139 139  K 3.1* 3.1*  CL 98 101  CO2 25 26  GLUCOSE 115* 99  BUN 10 7  CREATININE 0.86 0.87  CALCIUM 8.7* 8.4*  MG  --  1.5*  PHOS  --  3.6    CBC: Recent Labs  Lab 09/23/22 1331 09/24/22 0410  WBC 10.9* 10.1  NEUTROABS 7.9*  --   HGB 13.1 11.5*  HCT 41.5 35.8*  MCV 93.7 93.2  PLT 395 342    Cardiac Enzymes: No results for input(s): "CKTOTAL", "CKMB", "CKMBINDEX", "TROPONINI" in the last 168 hours.  Lipid Panel: No results for input(s): "CHOL", "TRIG", "HDL", "CHOLHDL", "VLDL", "LDLCALC" in the last 168 hours.  Imaging: MR Lumbar Spine W Wo Contrast  Result Date: 09/23/2022 CLINICAL DATA:  Initial evaluation for ataxia, weakness. EXAM: MRI LUMBAR SPINE WITHOUT AND WITH CONTRAST TECHNIQUE: Multiplanar and multiecho pulse sequences of the lumbar spine were obtained without and with intravenous contrast. CONTRAST:  39m GADAVIST GADOBUTROL 1 MMOL/ML IV SOLN COMPARISON:  Prior study from 04/16/2018. FINDINGS: Segmentation:  Examination moderately degraded by motion artifact. Standard segmentation. Lowest well-formed disc space labeled the L5-S1 level. Alignment:  Physiologic with preservation of the normal lumbar lordosis. No significant listhesis. Vertebrae: Vertebral body height maintained without acute or  chronic fracture. Bone marrow signal intensity diffusely heterogeneous without worrisome osseous lesion. Discogenic reactive endplate changes present about the L1-2 and L2-3 interspaces. No other abnormal marrow edema or enhancement. Conus medullaris and cauda equina: Conus extends to the L1 level. Conus and cauda equina appear normal. Paraspinal and other soft tissues: Paraspinous soft tissues demonstrate no acute finding. Asymmetric atrophy involving the left psoas muscle noted. Disc levels: L1-2: Disc bulge with disc desiccation. Superimposed left subarticular disc extrusion with inferior migration (series 22, image 11). Mild facet hypertrophy. Mild epidural lipomatosis. Resultant moderate canal with severe left lateral recess stenosis. Moderate left L1 foraminal narrowing. Right neural foramen remains patent. L2-3: Advanced degenerative intervertebral disc space narrowing with disc desiccation and diffuse disc bulge. Associated reactive endplate spurring. Prior left hemi laminectomy. Moderate facet hypertrophy. Mild epidural lipomatosis. Residual mild canal with bilateral subarticular stenosis. Mild-to-moderate bilateral foraminal narrowing. L3-4: Mild diffuse disc bulge with disc desiccation. Central annular fissure. Moderate bilateral facet and ligament flavum hypertrophy with associated small joint effusions. Epidural lipomatosis. Resultant moderate spinal stenosis. Mild to moderate bilateral L3 foraminal narrowing. L4-5: Mild disc bulge with disc desiccation. Moderate bilateral facet arthrosis with small joint effusions. Epidural lipomatosis. Secondary compression of the distal thecal sac with moderate to severe spinal stenosis. Foramina remain patent. L5-S1: Disc desiccation with mild disc bulge. Mild bilateral facet hypertrophy. Epidural lipomatosis. No significant spinal stenosis. Foramina remain patent. IMPRESSION: 1. Left subarticular disc extrusion with inferior migration at L1-2 with resultant  moderate canal and severe left lateral recess stenosis, with moderate left L1 foraminal narrowing. 2. Multifactorial degenerative changes at L3-4 with resultant moderate spinal stenosis, with mild to moderate bilateral L3 foraminal narrowing. 3. Epidural lipomatosis with resultant moderate to severe spinal stenosis at L4-5. Electronically Signed   By: Jeannine Boga M.D.   On: 09/23/2022 22:59   MR THORACIC SPINE W WO CONTRAST  Result Date: 09/23/2022 CLINICAL DATA:  Initial evaluation for ataxia, weakness. EXAM: MRI THORACIC WITHOUT AND WITH CONTRAST TECHNIQUE: Multiplanar and multiecho pulse sequences of the thoracic spine were obtained without and with intravenous contrast. CONTRAST:  88m GADAVIST GADOBUTROL 1 MMOL/ML IV SOLN COMPARISON:  None Available. FINDINGS: Alignment:  Examination moderately degraded by motion artifact. Vertebral bodies normally aligned with preservation of the normal thoracic kyphosis. No listhesis. Vertebrae: Vertebral body height maintained without acute or chronic fracture. Bone marrow signal intensity diffusely heterogeneous without worrisome osseous lesion. Scattered discogenic reactive endplate changes present throughout the thoracic spine. No other abnormal marrow edema or enhancement. Cord:  Normal signal and morphology.  No abnormal enhancement. Paraspinal and other soft tissues: Unremarkable. Disc levels: Moderate degenerative spondylosis with multilevel degenerative disc bulging and reactive endplate spurring present throughout the thoracic spine. Changes are most pronounced within the midthoracic spine at T5-6 through T10-11. No high-grade spinal stenosis by CT. Moderate bilateral foraminal narrowing present at T10-11. Foramina remain otherwise grossly patent. IMPRESSION: 1. Normal MRI appearance of the thoracic spinal cord. No cord compression or evidence for myelopathy. 2. Moderate degenerative spondylosis throughout the thoracic spine without high-grade spinal  stenosis. Moderate bilateral foraminal narrowing at T10-11. Electronically Signed   By: BJeannine BogaM.D.   On: 09/23/2022 22:43   MR Cervical Spine W or Wo Contrast  Result Date: 09/23/2022 CLINICAL DATA:  Initial evaluation for ataxia, weakness. EXAM: MRI CERVICAL SPINE WITHOUT AND WITH CONTRAST TECHNIQUE: Multiplanar and multiecho pulse sequences of  the cervical spine, to include the craniocervical junction and cervicothoracic junction, were obtained without and with intravenous contrast. CONTRAST:  38m GADAVIST GADOBUTROL 1 MMOL/ML IV SOLN COMPARISON:  Prior MRI report from 07/15/2016. FINDINGS: Alignment: Examination moderately degraded by motion artifact, limiting assessment. Straightening of the normal cervical lordosis.  No listhesis. Vertebrae: Prior ACDF at C4-C7. Vertebral body height maintained without acute or chronic fracture. Bone marrow signal intensity overall within normal limits. No worrisome osseous lesions. Discogenic reactive endplate changes noted about the T1-2 interspace. No other abnormal marrow edema. Cord: Normal signal and morphology.  No abnormal enhancement. Posterior Fossa, vertebral arteries, paraspinal tissues: Unremarkable. Disc levels: C2-C3: Minimal disc bulge with bilateral uncovertebral spurring. No significant spinal stenosis. Foramina remain patent. C3-C4: Mild disc bulge with bilateral uncovertebral spurring. No significant spinal stenosis. Foramina remain patent. C4-C5: Prior fusion. No residual spinal stenosis. Foramina remain patent. C5-C6: Prior fusion. No residual spinal stenosis. Bilateral uncovertebral spurring with residual moderate left with mild-to-moderate right C6 foraminal stenosis. C6-C7: Prior fusion. No residual spinal stenosis. Bilateral uncovertebral spurring with residual mild to moderate bilateral C7 foraminal stenosis. C7-T1: Degenerative intervertebral disc space narrowing with mild disc bulge. Right greater than left uncovertebral spurring.  No significant spinal stenosis. Mild right C8 foraminal stenosis. Left neural foramina remains patent. IMPRESSION: 1. Normal MRI appearance of the cervical spinal cord. No cord compression or evidence for myelopathy. 2. Prior ACDF at C4-C7 without residual spinal stenosis. 3. Mild to moderate bilateral C6 and C7 and right C8 foraminal stenosis related to uncovertebral disease. Electronically Signed   By: BJeannine BogaM.D.   On: 09/23/2022 22:32   CT Head Wo Contrast  Result Date: 09/23/2022 CLINICAL DATA:  Headache EXAM: CT HEAD WITHOUT CONTRAST TECHNIQUE: Contiguous axial images were obtained from the base of the skull through the vertex without intravenous contrast. RADIATION DOSE REDUCTION: This exam was performed according to the departmental dose-optimization program which includes automated exposure control, adjustment of the mA and/or kV according to patient size and/or use of iterative reconstruction technique. COMPARISON:  None Available. FINDINGS: Brain: No evidence of acute infarction, hemorrhage, hydrocephalus, extra-axial collection or mass lesion/mass effect. Partially empty sella. Vascular: No hyperdense vessel or unexpected calcification. Skull: Normal. Negative for fracture or focal lesion. Sinuses/Orbits: No acute finding. Other: None. IMPRESSION: No specific etiology for headaches identified. Electronically Signed   By: HMarin RobertsM.D.   On: 09/23/2022 17:01   DG Chest Portable 1 View  Result Date: 09/23/2022 CLINICAL DATA:  Numbness all over.  Weakness. EXAM: PORTABLE CHEST 1 VIEW COMPARISON:  04/23/2018. FINDINGS: Normal heart, mediastinum and hila. Clear lungs.  No pleural effusion or pneumothorax. Skeletal structures are grossly intact. Stable changes from a previous anterior cervical spine fusion. IMPRESSION: No active disease. Electronically Signed   By: DLajean ManesM.D.   On: 09/23/2022 13:22     Assessment: 57year old female presenting with rapidly ascending motor  weakness in conjunction with sensory numbness to FT, temp and proprioception.  - Exam reveals areflexia and diffuse limb weakness proximally and distally, worse in her lower extremities. Also with sensory changes involving all 4 limbs including absent proprioception to toes and ankles bilaterally.  - Imaging of the spine reveals no evidence for transverse myelitis or cord compression. Findings of degenerative changes and epidural lipomatosis are noted: - MRI lumbar spine: Left subarticular disc extrusion with inferior migration at L1-2 with resultant moderate canal and severe left lateral recess stenosis, with moderate left L1 foraminal narrowing. Multifactorial degenerative  changes at L3-4 with resultant moderate spinal stenosis, with mild to moderate bilateral L3 foraminal narrowing. Epidural lipomatosis with resultant moderate to severe spinal stenosis at L4-5. - MRI thoracic spine: Normal MRI appearance of the thoracic spinal cord. No cord compression or evidence for myelopathy. Moderate degenerative spondylosis throughout the thoracic spine without high-grade spinal stenosis. Moderate bilateral foraminal narrowing at T10-11. - MRI cervical spine: Normal MRI appearance of the cervical spinal cord. No cord compression or evidence for myelopathy. Prior ACDF at C4-C7 without residual spinal stenosis. Mild to moderate bilateral C6 and C7 and right C8 foraminal stenosis related to uncovertebral disease. - CT head: Unremarkable - Vitamin B12 level 1809 - Overall presentation is suggestive of Guillain-Barre syndrome. Transverse myelitis and cord compression have been ruled out by MRI. Paraneoplastic syndrome is possible, but would not be expected to be rapidly progressive and also would be significantly less likely than GBS epidemiologically (much lower incidence).   Recommendations: - Respiratory therapy consult for q8h NIF and FVC  - Fluoro guided LP for the following labs: Cell count with differential,  protein, glucose, IgG index.  - Fluoro guided LP is indicated based on morbid obesity, low pain tolerance on exam, history of low back pain and degenerative lumbar spondylosis on MRI.  - If LP is positive for albuminocytologic dissociation, consider IVIG or PLEX - Serum anti-MOG antibody titer. Anti-GM1 and anti-GQ1b antibody titers.  - Will order serum copper level and vitamin E level. These tests are likely to be low yield given MRI negative for spinal cord signa changes.   Electronically signed: Dr. Kerney Elbe 09/24/2022, 4:43 AM

## 2022-09-24 NOTE — Progress Notes (Signed)
PROGRESS NOTE    Dana [name]  DEY:[id number] DOB: [birth year] DOA: 09/23/2022 PCP: [name], MD   Brief Narrative:  Dana [name] is an 57 y.o. female with a PMHx of B12 deficiency (on B12 shots), anxiety, back pain, right breas mass, chronic opioid use, DJD, fibromyalgia, headache, hypercholesterolemia, mild hypertension, obesity and tobacco use who presented to the ED on Monday afternoon for evaluation of progressive limb weakness since Dec 8, followed by acute worsening beginning on Thursday, with the weakness ascending to involve her arms. She says she even feels like her neck is now weak.  She was admitted under hospitalist, seen by neurology, concern for GB syndrome.  Assessment & Plan:   Principal Problem:   Debility Active Problems:   Obesity   Low back pain with sciatica   HTN (hypertension)   B12 deficiency   Hypokalemia   UTI (urinary tract infection)   Hypomagnesemia  Ascending progressive weakness: On my examination, patient has 4 out of 5 power in all 4 extremities.  Neurology on board, plan for lumbar puncture and fluid analysis.  She has no inspiratory complaints.  Saturating more than 95% on room air.  Concern for GB syndrome.  Appreciate neurology and defer to them for management.  Essential hypertension: Blood pressure slightly elevated, she is not here for stroke, no need for allowing permissive hypertension, will resume Lasix and metoprolol and monitor.  Add as needed hydralazine.  Obesity: Dietary modification and weight loss counseling provided.  Hypokalemia: Replaced yesterday, waiting for labs today.  UTI: Continue Rocephin and follow culture.  B12 deficiency: Gets B12 shots as outpatient.  Hypomagnesemia: Replaced yesterday, waiting for labs today.  DVT prophylaxis: SCDs Start: 09/23/22 2355   Code Status: Full Code  Family Communication:  None present at bedside.  Plan of care discussed with patient in length and he/she verbalized  understanding and agreed with it.  Status is: Observation The patient will require care spanning > 2 midnights and should be moved to inpatient because: Patient with progressive weakness, needing LP, neurology on board.   Estimated body mass index is 47.34 kg/m as calculated from the following:   Height as of this encounter: '5\' 4"'$  (1.626 m).   Weight as of this encounter: 125.1 kg.    Nutritional Assessment: Body mass index is 47.34 kg/m.[name] Seen by dietician.  I agree with the assessment and plan as outlined below: Nutrition Status:        . Skin Assessment: I have examined the patient's skin and I agree with the wound assessment as performed by the wound care RN as outlined below:    Consultants:  Neurology  Procedures:  None  Antimicrobials:  Anti-infectives (From admission, onward)    Start     Dose/Rate Route Frequency Ordered Stop   09/24/22 0115  cefTRIAXone (ROCEPHIN) 1 g in sodium chloride 0.9 % 100 mL IVPB       Note to Pharmacy: Had in 2020   1 g 200 mL/hr over 30 Minutes Intravenous Daily at bedtime 09/24/22 0101           Subjective: Patient seen and examined, no new complaint.  Continues to have same weakness.  Objective: Vitals:   09/24/22 0430 09/24/22 0615 09/24/22 0958 09/24/22 1024  BP: 138/78 (!) 152/91 (!) 153/92   Pulse: 90 86    Resp:   16   Temp:    97.7 F (36.5 C)  TempSrc:   Oral Oral  SpO2: 95% 98% 95%  Weight:      Height:        Intake/Output Summary (Last 24 hours) at 09/24/2022 1047 Last data filed at 09/24/2022 0425 Gross per 24 hour  Intake 1300.6 ml  Output --  Net 1300.6 ml   Filed Weights   09/23/22 1244 09/23/22 1921 09/24/22 0241  Weight: 125.2 kg 125.2 kg 125.1 kg    Examination:  General exam: Appears calm and comfortable, obese Respiratory system: Clear to auscultation. Respiratory effort normal. Cardiovascular system: S1 & S2 heard, RRR. No JVD, murmurs, rubs, gallops or clicks. No pedal  edema. Gastrointestinal system: Abdomen is nondistended, soft and nontender. No organomegaly or masses felt. Normal bowel sounds heard. Central nervous system: Alert and oriented.  4/5 power in bilateral upper and lower extremities.  Slightly diminished deep tendon reflexes. Extremities: Symmetric 5 x 5 power. Skin: No rashes, lesions or ulcers Psychiatry: Judgement and insight appear normal. Mood & affect appropriate.    Data Reviewed: I have personally reviewed following labs and imaging studies  CBC: Recent Labs  Lab 09/23/22 1331 09/24/22 0410 09/24/22 1001  WBC 10.9* 10.1 8.1  NEUTROABS 7.9*  --  5.8  HGB 13.1 11.5* 11.9*  HCT 41.5 35.8* 37.7  MCV 93.7 93.2 95.2  PLT 395 342 761   Basic Metabolic Panel: Recent Labs  Lab 09/23/22 1331 09/23/22 2318  NA 139 139  K 3.1* 3.1*  CL 98 101  CO2 25 26  GLUCOSE 115* 99  BUN 10 7  CREATININE 0.86 0.87  CALCIUM 8.7* 8.4*  MG  --  1.5*  PHOS  --  3.6   GFR: Estimated Creatinine Clearance: 93.4 mL/min (by C-G formula based on SCr of 0.87 mg/dL). Liver Function Tests: Recent Labs  Lab 09/23/22 1331  AST 33  ALT 19  ALKPHOS 79  BILITOT 0.9  PROT 6.8  ALBUMIN 3.1*   No results for input(s): "LIPASE", "AMYLASE" in the last 168 hours. No results for input(s): "AMMONIA" in the last 168 hours. Coagulation Profile: Recent Labs  Lab 09/23/22 2318  INR 1.0   Cardiac Enzymes: No results for input(s): "CKTOTAL", "CKMB", "CKMBINDEX", "TROPONINI" in the last 168 hours. BNP (last 3 results) No results for input(s): "PROBNP" in the last 8760 hours. HbA1C: No results for input(s): "HGBA1C" in the last 72 hours. CBG: No results for input(s): "GLUCAP" in the last 168 hours. Lipid Profile: No results for input(s): "CHOL", "HDL", "LDLCALC", "TRIG", "CHOLHDL", "LDLDIRECT" in the last 72 hours. Thyroid Function Tests: Recent Labs    09/23/22 2306  TSH 3.882   Anemia Panel: Recent Labs    09/23/22 2306 09/23/22 2318   VITAMINB12 1,809*  --   FOLATE  --  6.6   Sepsis Labs: Recent Labs  Lab 09/23/22 2318  LATICACIDVEN 1.2    Recent Results (from the past 240 hour(s))  Resp panel by RT-PCR (RSV, Flu A&B, Covid) Anterior Nasal Swab     Status: None   Collection Time: 09/23/22  1:31 PM   Specimen: Anterior Nasal Swab  Result Value Ref Range Status   SARS Coronavirus 2 by RT PCR NEGATIVE NEGATIVE Final    Comment: (NOTE) SARS-CoV-2 target nucleic acids are NOT DETECTED.  The SARS-CoV-2 RNA is generally detectable in upper respiratory specimens during the acute phase of infection. The lowest concentration of SARS-CoV-2 viral copies this assay can detect is 138 copies/mL. A negative result does not preclude SARS-Cov-2 infection and should not be used as the sole basis for treatment or other patient  management decisions. A negative result may occur with  improper specimen collection/handling, submission of specimen other than nasopharyngeal swab, presence of viral mutation(s) within the areas targeted by this assay, and inadequate number of viral copies(<138 copies/mL). A negative result must be combined with clinical observations, patient history, and epidemiological information. The expected result is Negative.  Fact Sheet for Patients:  EntrepreneurPulse.com.au  Fact Sheet for Healthcare Providers:  IncredibleEmployment.be  This test is no t yet approved or cleared by the [place] FDA and  has been authorized for detection and/or diagnosis of SARS-CoV-2 by FDA under an Emergency Use Authorization (EUA). This EUA will remain  in effect (meaning this test can be used) for the duration of the COVID-19 declaration under Section 564(b)(1) of the Act, 21 U.S.C.section 360bbb-3(b)(1), unless the authorization is terminated  or revoked sooner.       Influenza A by PCR NEGATIVE NEGATIVE Final   Influenza B by PCR NEGATIVE NEGATIVE Final    Comment:  (NOTE) The Xpert Xpress SARS-CoV-2/FLU/RSV plus assay is intended as an aid in the diagnosis of influenza from Nasopharyngeal swab specimens and should not be used as a sole basis for treatment. Nasal washings and aspirates are unacceptable for Xpert Xpress SARS-CoV-2/FLU/RSV testing.  Fact Sheet for Patients: EntrepreneurPulse.com.au  Fact Sheet for Healthcare Providers: IncredibleEmployment.be  This test is not yet approved or cleared by the [place] FDA and has been authorized for detection and/or diagnosis of SARS-CoV-2 by FDA under an Emergency Use Authorization (EUA). This EUA will remain in effect (meaning this test can be used) for the duration of the COVID-19 declaration under Section 564(b)(1) of the Act, 21 U.S.C. section 360bbb-3(b)(1), unless the authorization is terminated or revoked.     Resp Syncytial Virus by PCR NEGATIVE NEGATIVE Final    Comment: (NOTE) Fact Sheet for Patients: EntrepreneurPulse.com.au  Fact Sheet for Healthcare Providers: IncredibleEmployment.be  This test is not yet approved or cleared by the [place] FDA and has been authorized for detection and/or diagnosis of SARS-CoV-2 by FDA under an Emergency Use Authorization (EUA). This EUA will remain in effect (meaning this test can be used) for the duration of the COVID-19 declaration under Section 564(b)(1) of the Act, 21 U.S.C. section 360bbb-3(b)(1), unless the authorization is terminated or revoked.  Performed at [facility], [place] [address], [place], [place] [address]      Radiology Studies: MR Lumbar Spine W Wo Contrast  Result Date: 09/23/2022 CLINICAL DATA:  Initial evaluation for ataxia, weakness. EXAM: MRI LUMBAR SPINE WITHOUT AND WITH CONTRAST TECHNIQUE: Multiplanar and multiecho pulse sequences of the lumbar spine were obtained without and with intravenous contrast. CONTRAST:  15m  GADAVIST GADOBUTROL 1 MMOL/ML IV SOLN COMPARISON:  Prior study from 04/16/2018. FINDINGS: Segmentation:  Examination moderately degraded by motion artifact. Standard segmentation. Lowest well-formed disc space labeled the L5-S1 level. Alignment: Physiologic with preservation of the normal lumbar lordosis. No significant listhesis. Vertebrae: Vertebral body height maintained without acute or chronic fracture. Bone marrow signal intensity diffusely heterogeneous without worrisome osseous lesion. Discogenic reactive endplate changes present about the L1-2 and L2-3 interspaces. No other abnormal marrow edema or enhancement. Conus medullaris and cauda equina: Conus extends to the L1 level. Conus and cauda equina appear normal. Paraspinal and other soft tissues: Paraspinous soft tissues demonstrate no acute finding. Asymmetric atrophy involving the left psoas muscle noted. Disc levels: L1-2: Disc bulge with disc desiccation. Superimposed left subarticular disc extrusion with inferior migration (series 22, image 11). Mild facet hypertrophy.  Mild epidural lipomatosis. Resultant moderate canal with severe left lateral recess stenosis. Moderate left L1 foraminal narrowing. Right neural foramen remains patent. L2-3: Advanced degenerative intervertebral disc space narrowing with disc desiccation and diffuse disc bulge. Associated reactive endplate spurring. Prior left hemi laminectomy. Moderate facet hypertrophy. Mild epidural lipomatosis. Residual mild canal with bilateral subarticular stenosis. Mild-to-moderate bilateral foraminal narrowing. L3-4: Mild diffuse disc bulge with disc desiccation. Central annular fissure. Moderate bilateral facet and ligament flavum hypertrophy with associated small joint effusions. Epidural lipomatosis. Resultant moderate spinal stenosis. Mild to moderate bilateral L3 foraminal narrowing. L4-5: Mild disc bulge with disc desiccation. Moderate bilateral facet arthrosis with small joint effusions.  Epidural lipomatosis. Secondary compression of the distal thecal sac with moderate to severe spinal stenosis. Foramina remain patent. L5-S1: Disc desiccation with mild disc bulge. Mild bilateral facet hypertrophy. Epidural lipomatosis. No significant spinal stenosis. Foramina remain patent. IMPRESSION: 1. Left subarticular disc extrusion with inferior migration at L1-2 with resultant moderate canal and severe left lateral recess stenosis, with moderate left L1 foraminal narrowing. 2. Multifactorial degenerative changes at L3-4 with resultant moderate spinal stenosis, with mild to moderate bilateral L3 foraminal narrowing. 3. Epidural lipomatosis with resultant moderate to severe spinal stenosis at L4-5. Electronically Signed   By: Jeannine Boga M.D.   On: 09/23/2022 22:59   MR THORACIC SPINE W WO CONTRAST  Result Date: 09/23/2022 CLINICAL DATA:  Initial evaluation for ataxia, weakness. EXAM: MRI THORACIC WITHOUT AND WITH CONTRAST TECHNIQUE: Multiplanar and multiecho pulse sequences of the thoracic spine were obtained without and with intravenous contrast. CONTRAST:  46m GADAVIST GADOBUTROL 1 MMOL/ML IV SOLN COMPARISON:  None Available. FINDINGS: Alignment:  Examination moderately degraded by motion artifact. Vertebral bodies normally aligned with preservation of the normal thoracic kyphosis. No listhesis. Vertebrae: Vertebral body height maintained without acute or chronic fracture. Bone marrow signal intensity diffusely heterogeneous without worrisome osseous lesion. Scattered discogenic reactive endplate changes present throughout the thoracic spine. No other abnormal marrow edema or enhancement. Cord:  Normal signal and morphology.  No abnormal enhancement. Paraspinal and other soft tissues: Unremarkable. Disc levels: Moderate degenerative spondylosis with multilevel degenerative disc bulging and reactive endplate spurring present throughout the thoracic spine. Changes are most pronounced within the  midthoracic spine at T5-6 through T10-11. No high-grade spinal stenosis by CT. Moderate bilateral foraminal narrowing present at T10-11. Foramina remain otherwise grossly patent. IMPRESSION: 1. Normal MRI appearance of the thoracic spinal cord. No cord compression or evidence for myelopathy. 2. Moderate degenerative spondylosis throughout the thoracic spine without high-grade spinal stenosis. Moderate bilateral foraminal narrowing at T10-11. Electronically Signed   By: BJeannine BogaM.D.   On: 09/23/2022 22:43   MR Cervical Spine W or Wo Contrast  Result Date: 09/23/2022 CLINICAL DATA:  Initial evaluation for ataxia, weakness. EXAM: MRI CERVICAL SPINE WITHOUT AND WITH CONTRAST TECHNIQUE: Multiplanar and multiecho pulse sequences of the cervical spine, to include the craniocervical junction and cervicothoracic junction, were obtained without and with intravenous contrast. CONTRAST:  172mGADAVIST GADOBUTROL 1 MMOL/ML IV SOLN COMPARISON:  Prior MRI report from 07/15/2016. FINDINGS: Alignment: Examination moderately degraded by motion artifact, limiting assessment. Straightening of the normal cervical lordosis.  No listhesis. Vertebrae: Prior ACDF at C4-C7. Vertebral body height maintained without acute or chronic fracture. Bone marrow signal intensity overall within normal limits. No worrisome osseous lesions. Discogenic reactive endplate changes noted about the T1-2 interspace. No other abnormal marrow edema. Cord: Normal signal and morphology.  No abnormal enhancement. Posterior Fossa, vertebral arteries, paraspinal tissues: Unremarkable. Disc  levels: C2-C3: Minimal disc bulge with bilateral uncovertebral spurring. No significant spinal stenosis. Foramina remain patent. C3-C4: Mild disc bulge with bilateral uncovertebral spurring. No significant spinal stenosis. Foramina remain patent. C4-C5: Prior fusion. No residual spinal stenosis. Foramina remain patent. C5-C6: Prior fusion. No residual spinal  stenosis. Bilateral uncovertebral spurring with residual moderate left with mild-to-moderate right C6 foraminal stenosis. C6-C7: Prior fusion. No residual spinal stenosis. Bilateral uncovertebral spurring with residual mild to moderate bilateral C7 foraminal stenosis. C7-T1: Degenerative intervertebral disc space narrowing with mild disc bulge. Right greater than left uncovertebral spurring. No significant spinal stenosis. Mild right C8 foraminal stenosis. Left neural foramina remains patent. IMPRESSION: 1. Normal MRI appearance of the cervical spinal cord. No cord compression or evidence for myelopathy. 2. Prior ACDF at C4-C7 without residual spinal stenosis. 3. Mild to moderate bilateral C6 and C7 and right C8 foraminal stenosis related to uncovertebral disease. Electronically Signed   By: Jeannine Boga M.D.   On: 09/23/2022 22:32   CT Head Wo Contrast  Result Date: 09/23/2022 CLINICAL DATA:  Headache EXAM: CT HEAD WITHOUT CONTRAST TECHNIQUE: Contiguous axial images were obtained from the base of the skull through the vertex without intravenous contrast. RADIATION DOSE REDUCTION: This exam was performed according to the departmental dose-optimization program which includes automated exposure control, adjustment of the mA and/or kV according to patient size and/or use of iterative reconstruction technique. COMPARISON:  None Available. FINDINGS: Brain: No evidence of acute infarction, hemorrhage, hydrocephalus, extra-axial collection or mass lesion/mass effect. Partially empty sella. Vascular: No hyperdense vessel or unexpected calcification. Skull: Normal. Negative for fracture or focal lesion. Sinuses/Orbits: No acute finding. Other: None. IMPRESSION: No specific etiology for headaches identified. Electronically Signed   By: Marin Roberts M.D.   On: 09/23/2022 17:01   DG Chest Portable 1 View  Result Date: 09/23/2022 CLINICAL DATA:  Numbness all over.  Weakness. EXAM: PORTABLE CHEST 1 VIEW COMPARISON:   04/23/2018. FINDINGS: Normal heart, mediastinum and hila. Clear lungs.  No pleural effusion or pneumothorax. Skeletal structures are grossly intact. Stable changes from a previous anterior cervical spine fusion. IMPRESSION: No active disease. Electronically Signed   By: Lajean Manes M.D.   On: 09/23/2022 13:22    Scheduled Meds:  folic acid  1 mg Oral Daily   gabapentin  300 mg Oral TID   latanoprost  1 drop Both Eyes QHS   pantoprazole  40 mg Oral Daily   Continuous Infusions:  cefTRIAXone (ROCEPHIN)  IV Stopped (09/24/22 0158)     LOS: 0 days   Darliss Cheney, MD [facility]  09/24/2022, 10:47 AM   *Please note that this is a verbal dictation therefore any spelling or grammatical errors are due to the "[place] [employer]" system interpretation.  Please page via [place] and do not message via secure chat for urgent patient care matters. Secure chat can be used for non urgent patient care matters.  How to contact the [facility] Attending or Consulting provider [place] or covering provider during after hours [place], for this patient?  Check the care team in [facility] and look for a) attending/consulting [facility] provider listed and b) the [facility] team listed. Page or secure chat [phone]. Log into [email or web address] and use [place]'s universal password to access. If you do not have the password, please contact the hospital operator. Locate the [facility] provider you are looking for under [facility] and page to a number that you can be directly reached. If you still have difficulty reaching the  provider, please page the [facility] (Director on Call) for the Hospitalists listed on amion for assistance.

## 2022-09-24 NOTE — Assessment & Plan Note (Signed)
Check B12 level. 

## 2022-09-24 NOTE — Plan of Care (Signed)

## 2022-09-25 ENCOUNTER — Inpatient Hospital Stay (HOSPITAL_COMMUNITY): Payer: 59

## 2022-09-25 DIAGNOSIS — G610 Guillain-Barre syndrome: Secondary | ICD-10-CM | POA: Diagnosis not present

## 2022-09-25 DIAGNOSIS — R531 Weakness: Secondary | ICD-10-CM

## 2022-09-25 DIAGNOSIS — E876 Hypokalemia: Secondary | ICD-10-CM

## 2022-09-25 DIAGNOSIS — R5381 Other malaise: Secondary | ICD-10-CM | POA: Diagnosis not present

## 2022-09-25 LAB — MISC LABCORP TEST (SEND OUT): Labcorp test code: 505310

## 2022-09-25 LAB — PROTEIN AND GLUCOSE, CSF
Glucose, CSF: 70 mg/dL (ref 40–70)
Total  Protein, CSF: 155 mg/dL — ABNORMAL HIGH (ref 15–45)

## 2022-09-25 LAB — CSF CELL COUNT WITH DIFFERENTIAL
RBC Count, CSF: 17750 /mm3 — ABNORMAL HIGH
Tube #: 1
WBC, CSF: 3 /mm3 (ref 0–5)

## 2022-09-25 LAB — ALDOLASE: Aldolase: 6.6 U/L (ref 3.3–10.3)

## 2022-09-25 MED ORDER — POTASSIUM CHLORIDE CRYS ER 20 MEQ PO TBCR
40.0000 meq | EXTENDED_RELEASE_TABLET | Freq: Once | ORAL | Status: AC
  Administered 2022-09-25: 40 meq via ORAL
  Filled 2022-09-25: qty 2

## 2022-09-25 MED ORDER — LIDOCAINE HCL (PF) 1 % IJ SOLN
5.0000 mL | Freq: Once | INTRAMUSCULAR | Status: AC
  Administered 2022-09-25: 5 mL via INTRADERMAL

## 2022-09-25 MED ORDER — ADULT MULTIVITAMIN W/MINERALS CH
1.0000 | ORAL_TABLET | Freq: Every day | ORAL | Status: DC
  Administered 2022-09-25 – 2022-10-02 (×8): 1 via ORAL
  Filled 2022-09-25 (×8): qty 1

## 2022-09-25 MED ORDER — IMMUNE GLOBULIN (HUMAN) 10 GM/100ML IV SOLN
400.0000 mg/kg | INTRAVENOUS | Status: DC
  Filled 2022-09-25: qty 350

## 2022-09-25 MED ORDER — IMMUNE GLOBULIN (HUMAN) 10 GM/100ML IV SOLN: 400.0000 mg/kg | INTRAVENOUS | Status: DC

## 2022-09-25 MED ORDER — IMMUNE GLOBULIN (HUMAN) 10 GM/100ML IV SOLN
400.0000 mg/kg | INTRAVENOUS | Status: AC
  Administered 2022-09-25: 30 g via INTRAVENOUS
  Administered 2022-09-26 – 2022-09-29 (×4): 35 g via INTRAVENOUS
  Filled 2022-09-25 (×6): qty 350

## 2022-09-25 NOTE — Progress Notes (Signed)
Pt performed NIF and VC per physician order. NIF (-40) and VC (2.02L) Great effort given by pt. RT will continue to be available as needed.

## 2022-09-25 NOTE — Progress Notes (Signed)
NIF -40. VC 2.0L. Great patient effort.  

## 2022-09-25 NOTE — Progress Notes (Signed)
Subjective: LP was unsuccessful yesterday, I discussed this with radiology, there is a chance of success based on her MRI, and therefore I do think repeating attempt would be reasonable.  Exam: Vitals:   09/25/22 0006 09/25/22 0556  BP: (!) 150/76 (!) 155/97  Pulse: (!) 102 98  Resp: 18 18  Temp: 98.5 F (36.9 C) 98.3 F (36.8 C)  SpO2: 95% 100%   Gen: In bed, NAD Resp: non-labored breathing, no acute distress Abd: soft, nt  Neuro: MS: Awake, alert, interactive and appropriate, oriented CN: EOMI, no ptosis, face symmetric Motor: 5/5 neck flexion and neck extension, she has 5/5 shoulder abduction, 4/5 elbow extension and flexion bilaterally, 4 -/5 grip, 4/5 throughout lower extremities Sensory: Reduced sensation to light touch to just above the knee DTR: Absent  Impression: 57 year old female with ascending weakness and numbness most consistent with Guillain-Barr syndrome.  We will re-attempt lumbar puncture today as I do think it could be helpful in guiding further care, but if unsuccessful again then I would favor going ahead with IVIG either way.  Recommendations: 1) repeat LP for cells, protein, glucose, 2) following LP unless there is an unexpected finding, would start IVIG for 2 g/kg divided over 5 days 3) neurology will continue to follow  Roland Rack, MD [facility] [phone]  If 7pm- 7am, please page neurology on call as listed in [place].

## 2022-09-25 NOTE — Progress Notes (Signed)
Initial Nutrition Assessment  DOCUMENTATION CODES:   Morbid obesity  INTERVENTION:  - Add MVI q day.   NUTRITION DIAGNOSIS:   Other (Comment) (Overweight/Obesity) related to limited prior education as evidenced by other (comment) (BMI >40).  GOAL:   Patient will meet greater than or equal to 90% of their needs  MONITOR:   PO intake  REASON FOR ASSESSMENT:   Consult Assessment of nutrition requirement/status  ASSESSMENT:   57 y.o. female admits related to weakness, numbness, and fall. OMH includes: IBS, fibromyalgia, HTN, HLD, lumbar radiculopathy. Pt is currently receiving medical management related to debility.  Meds reviewed: folic acid. Labs reviewed.   Pt was out of room at time of assessment. Per record, pt ate 100% of her breakfast this am. Per record, pt has experienced some wt loss over the past couple of months. RD will attempt to gather more nutrition hx details at f/u. Will add a MVI in the meantime. Will hold off on adding supplements for now since pt is eating 75% of meals. Will continue to closely monitor.   NUTRITION - FOCUSED PHYSICAL EXAM:  Unable to assess. Will attempt at f/u.   Diet Order:   Diet Order             Diet Heart Room service appropriate? Yes; Fluid consistency: Thin  Diet effective now                   EDUCATION NEEDS:   No education needs have been identified at this time  Skin:  Skin Assessment: Reviewed RN Assessment  Last BM:     Height:   Ht Readings from Last 1 Encounters:  09/24/22 '5\' 4"'$  (1.626 m)    Weight:   Wt Readings from Last 1 Encounters:  09/24/22 125.1 kg    Ideal Body Weight:     BMI:  Body mass index is 47.34 kg/m.  Estimated Nutritional Needs:   Kcal:  1345-1750 kcals  Protein:  80-110 gm  Fluid:  >/= 1.3 L  Thalia Bloodgood, RD, LDN, CNSC.

## 2022-09-25 NOTE — Progress Notes (Signed)
Occupational Therapy Treatment Patient Details Name: Dana [name] MRN: [id number] DOB: [birth year] Today's Date: 09/25/2022   History of present illness Pt is a 57 y/o female who presented on 09/23/22 with progressive weakness and sensation changes. Symptoms worsening since 08/23/22. MRI negative, pending LP for possible GBS. PMH: anxiety, DJD, fibromyalgia, HTN, tobacco use.   OT comments  Pt making excellent progress towards goals with focus on initial OOB attempts. To maximize confidence and safety, trialed Stedy with pt progressing to Min A x 1 during session for transfer to chair. Due to impaired BLE sensation/proprioception, pt remains hesitant on trying RW but will attempt in next session. Educated on use of Stedy with nursing staff to/from recliner and toilet. Continue to feel pt would progress well with AIR level therapies.    Recommendations for follow up therapy are one component of a multi-disciplinary discharge planning process, led by the attending physician.  Recommendations may be updated based on patient status, additional functional criteria and insurance authorization.    Follow Up Recommendations  Acute inpatient rehab (3hours/day)     Assistance Recommended at Discharge Intermittent Supervision/Assistance  Patient can return home with the following  A lot of help with walking and/or transfers;A lot of help with bathing/dressing/bathroom   Equipment Recommendations  Other (comment) (TBD pending progress)    Recommendations for Other Services Rehab consult    Precautions / Restrictions Precautions Precautions: Fall Restrictions Weight Bearing Restrictions: No       Mobility Bed Mobility Overal bed mobility: Needs Assistance Bed Mobility: Supine to Sit     Supine to sit: Mod assist     General bed mobility comments: light Mod A to sit EOB, cues for bedrail use, assist for LLE and light assist to lift trunk    Transfers Overall transfer level: Needs  assistance Equipment used: Ambulation equipment used Transfers: Sit to/from Stand, Bed to chair/wheelchair/BSC Sit to Stand: Min assist, +2 physical assistance, +2 safety/equipment           General transfer comment: Initial stand from bedside Min A x 2 into Stedy, progressing to Min A with trial from recliner with cues for hand placement and sequencing. Deferred RW attempts to ensure confidence and rapport building with pt in light of anxiety and impaired sensation Transfer via Lift Equipment: Stedy   Balance Overall balance assessment: Needs assistance Sitting-balance support: No upper extremity supported, Feet unsupported Sitting balance-Leahy Scale: Fair     Standing balance support: Bilateral upper extremity supported, During functional activity Standing balance-Leahy Scale: Poor                             ADL either performed or assessed with clinical judgement   ADL Overall ADL's : Needs assistance/impaired             Lower Body Bathing: Moderate assistance;Sitting/lateral leans;Sit to/from stand Lower Body Bathing Details (indicate cue type and reason): assist for peri care in standing with Stedy. pt likely able to assist with upper LE and partial lower LE seated                       General ADL Comments: Focus on progression of standing, WB through BLE and initial OOB attempts    Extremity/Trunk Assessment Upper Extremity Assessment Upper Extremity Assessment: RUE deficits/detail RUE Deficits / Details: impaired sensation grossly BUE, still able to functionally hold objects and demo fair strength   Lower Extremity Assessment  Lower Extremity Assessment: Defer to PT evaluation        Vision   Vision Assessment?: No apparent visual deficits   Perception     Praxis      Cognition Arousal/Alertness: Awake/alert Behavior During Therapy: WFL for tasks assessed/performed, Anxious Overall Cognitive Status: Within Functional Limits for  tasks assessed                                 General Comments: continued anxiety with mobility attempts but responds well to encouragement        Exercises      Shoulder Instructions       General Comments Seen after foley cath placement with noted output in bag during session. pt reporting relief    Pertinent Vitals/ Pain       Pain Assessment Pain Assessment: Faces Faces Pain Scale: Hurts little more Pain Location: LLE, back Pain Descriptors / Indicators: Grimacing, Guarding Pain Intervention(s): Monitored during session, Limited activity within patient's tolerance  Home Living                                          Prior Functioning/Environment              Frequency  Min 2X/week        Progress Toward Goals  OT Goals(current goals can now be found in the care plan section)  Progress towards OT goals: Progressing toward goals  Acute Rehab OT Goals Patient Stated Goal: be able to use legs normally again OT Goal Formulation: With patient Time For Goal Achievement: 10/08/22 Potential to Achieve Goals: Good  Plan Discharge plan remains appropriate    Co-evaluation    PT/OT/SLP Co-Evaluation/Treatment: Yes Reason for Co-Treatment: Complexity of the patient's impairments (multi-system involvement);For patient/therapist safety;To address functional/ADL transfers   OT goals addressed during session: ADL's and self-care;Strengthening/ROM      AM-PAC OT "6 Clicks" Daily Activity     Outcome Measure   Help from another person eating meals?: None Help from another person taking care of personal grooming?: A Little Help from another person toileting, which includes using toliet, bedpan, or urinal?: A Lot Help from another person bathing (including washing, rinsing, drying)?: A Lot Help from another person to put on and taking off regular upper body clothing?: A Little Help from another person to put on and taking off  regular lower body clothing?: A Lot 6 Click Score: 16    End of Session Equipment Utilized During Treatment: Gait belt  OT Visit Diagnosis: Other abnormalities of gait and mobility (R26.89);Muscle weakness (generalized) (M62.81)   Activity Tolerance Patient tolerated treatment well   Patient Left in chair;with call bell/phone within reach   Nurse Communication Mobility status (discussed with NT)        Time: 7858-8502 OT Time Calculation (min): 24 min  Charges: OT General Charges $OT Visit: 1 Visit OT Treatments $Therapeutic Activity: 8-22 mins  Malachy Chamber, OTR/L Acute Rehab Services Office: [phone]   Layla Maw 09/25/2022, 11:41 AM

## 2022-09-25 NOTE — TOC Initial Note (Signed)
Transition of Care [facility]) - Initial/Assessment Note    Patient Details  Name: Dana [name] MRN: [id number] Date of Birth: Jan 26, 1966  Transition of Care [facility]) CM/SW Contact:    Tom-Johnson, Renea Ee, RN Phone Number: 09/25/2022, 4:15 PM  Clinical Narrative:                  CM spoke with patient at bedside about needs for post hospital transition. Admitted for [place]. Patient C/O generalized weakness affecting her mobility and grips. Very tearful during assessment, Spiritual counseling offered and patient accepted. Order placed.  Patient is from home with husband, mother in-law stays with them. Has four supportive children. Currently on disability, does not drive, husband transports to and from appointments and does errands. Has a cane, rollator and a shower seat in the guest bathroom.  PCP is [name], MD and [facility] [address].  CIR recommended and following.  CM will continue to follow as patient progresses with care towards discharge.         Expected Discharge Plan: IP Rehab Facility Barriers to Discharge: Continued Medical Work up   Patient Goals and CMS Choice Patient states their goals for this hospitalization and ongoing recovery are:: To go to rehab and return home CMS Medicare.gov Compare Post Acute Care list provided to:: Patient Choice offered to / list presented to : Patient      Expected Discharge Plan and Services In-house Referral: Chaplain Discharge Planning Services: CM Consult Post Acute Care Choice: IP Rehab Living arrangements for the past 2 months: Single Family Home                           HH Arranged: NA [place] Agency: NA        Prior Living Arrangements/Services Living arrangements for the past 2 months: Single Family Home Lives with:: Spouse, Relatives Patient language and need for interpreter reviewed:: Yes Do you feel safe going back to the place where you live?: Yes      Need for Family Participation  in Patient Care: Yes (Comment) Care giver support system in place?: Yes (comment) Current home services: DME (Cane, rollator, shower seat.) Criminal Activity/Legal Involvement Pertinent to Current Situation/Hospitalization: No - Comment as needed  Activities of Daily Living Home Assistive Devices/Equipment: Walker (specify type) ADL Screening (condition at time of admission) Patient's cognitive ability adequate to safely complete daily activities?: Yes Is the patient deaf or have difficulty hearing?: No Does the patient have difficulty seeing, even when wearing glasses/contacts?: No Does the patient have difficulty concentrating, remembering, or making decisions?: No Patient able to express need for assistance with ADLs?: Yes Does the patient have difficulty dressing or bathing?: Yes Independently performs ADLs?: No Communication: Independent Dressing (OT): Needs assistance Is this a change from baseline?: Pre-admission baseline Grooming: Needs assistance Is this a change from baseline?: Pre-admission baseline Feeding: Independent Bathing: Needs assistance Is this a change from baseline?: Pre-admission baseline Toileting: Needs assistance Is this a change from baseline?: Pre-admission baseline In/Out Bed: Dependent Is this a change from baseline?: Pre-admission baseline Walks in Home: Dependent Is this a change from baseline?: Pre-admission baseline Does the patient have difficulty walking or climbing stairs?: Yes Weakness of Legs: Both Weakness of Arms/Hands: Both  Permission Sought/Granted Permission sought to share information with : Case Manager, Customer service manager, Family Supports Permission granted to share information with : Yes, Verbal Permission Granted  Emotional Assessment Appearance:: Appears stated age Attitude/Demeanor/Rapport: Engaged, Gracious Affect (typically observed): Accepting, Appropriate, Hopeful, Pleasant,  Tearful/Crying Orientation: : Oriented to Self, Oriented to Place, Oriented to  Time, Oriented to Situation Alcohol / Substance Use: Not Applicable Psych Involvement: No (comment)  Admission diagnosis:  Hypokalemia [E87.6] Debility [R53.81] Weakness [R53.1] Patient Active Problem List   Diagnosis Date Noted   UTI (urinary tract infection) 09/24/2022   Hypomagnesemia 09/24/2022   Debility 09/23/2022   Hypokalemia 09/23/2022   B12 deficiency 09/05/2022   Folate deficiency 09/05/2022   Carpal tunnel syndrome, left upper limb 05/11/2019    Class: Chronic   Spinal stenosis of lumbar region with neurogenic claudication 08/20/2018   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   Primary osteoarthritis of right knee 02/27/2017   Right tennis elbow 11/11/2016   Muscle cramps 01/15/2016   Bilateral leg edema 01/08/2016   Radiculopathy 12/20/2015   Hyperglycemia 12/21/2014   Mastodynia, female 11/30/2014   S/P excision of fibroadenoma of breast 11/30/2014   Angioedema of lips 04/07/2014   Fibroadenoma of right breast 03/07/2014   Pelvic pain in female 06/19/2012   Menorrhagia 06/11/2012   SUI (stress urinary incontinence, female) 06/11/2012   HTN (hypertension) 06/11/2012   IBS (irritable bowel syndrome) - diarrhea predominant 03/17/2012   MICROSCOPIC HEMATURIA 11/30/2010   BREAST PAIN, RIGHT 11/30/2010   BREAST MASS, RIGHT 01/12/2010   HYPERCHOLESTEROLEMIA 12/07/2007   CIGARETTE SMOKER 12/07/2007   DEGENERATIVE JOINT DISEASE 12/07/2007   Low back pain with sciatica 12/07/2007   HEADACHE 12/07/2007   Obesity 08/03/2007   Anxiety state 08/03/2007   PCP:  [name], MD [id number] [place] [address] [place] [address]Phone: [phone]: [phone]    Social Determinants of Health (SDOH) Social History: SDOH Screenings   Food Insecurity: No Food Insecurity (05/06/2022)  Transportation Needs: No  Transportation Needs (05/06/2022)  Depression (PHQ2-9): Low Risk  (09/05/2022)  Financial Resource Strain: Low Risk  (05/06/2022)  Physical Activity: Inactive (05/06/2022)  Stress: No Stress Concern Present (05/06/2022)  Tobacco Use: Medium Risk (09/23/2022)   SDOH Interventions: Transportation Interventions: Intervention Not Indicated, Inpatient TOC, Patient Resources (Friends/Family)   Readmission Risk Interventions     No data to display

## 2022-09-25 NOTE — Progress Notes (Signed)
Physical Therapy Treatment Patient Details Name: Dana [name] MRN: [id number] DOB: 01/04/1966 Today's Date: 09/25/2022   History of Present Illness Pt is a 57 y/o female who presented with progressive weakness and sensation changes, consistent with Guillain-Barr syndrome. MRI negative. Unsuccessful LP 1/9; plan for reattempt 1/10. PMH includes anxiety, DJD, fibromyalgia, HTN, tobacco use.   PT Comments    Pt progressing with mobility. Today's session focused on transfer training with [facility] as pt remains anxious regarding standing attempts; pt able to perform multiple, prolonged bouts of standing with stedy and minA; pt encouraged by ability to get OOB. Pt remains limited by generalized weakness, decreased activity tolerance, and impaired balance strategies/postural reactions. Continue to recommend intensive AIR-level therapies to maximize functional mobility and independence prior to return home.    Recommendations for follow up therapy are one component of a multi-disciplinary discharge planning process, led by the attending physician.  Recommendations may be updated based on patient status, additional functional criteria and insurance authorization.  Follow Up Recommendations  Acute inpatient rehab (3hours/day)     Assistance Recommended at Discharge Frequent or constant Supervision/Assistance  Patient can return home with the following A lot of help with walking and/or transfers;A lot of help with bathing/dressing/bathroom   Equipment Recommendations  Other (TBD)    Recommendations for Other Services Rehab consult     Precautions / Restrictions Precautions Precautions: Fall Restrictions Weight Bearing Restrictions: No     Mobility  Bed Mobility Overal bed mobility: Needs Assistance Bed Mobility: Supine to Sit     Supine to sit: Mod assist          Transfers Overall transfer level: Needs assistance Equipment used: Ambulation equipment used Transfers: Sit to/from  Stand, Bed to chair/wheelchair/BSC Sit to Stand: Min assist, +2 physical assistance, +2 safety/equipment           General transfer comment: Initial stand from bedside Min A x 2 into Stedy, progressing to Min A with trial from recliner with cues for hand placement and sequencing. Deferred RW attempts to ensure confidence and rapport building with pt in light of anxiety and impaired sensation Transfer via Lift Equipment: Stedy  Ambulation/Gait             Pre-gait activities: pt declined attempts at pre-gait activity in stedy, reports "not ready for that yet" since first time standing since prior to admission     Stairs             Wheelchair Mobility    Modified Rankin (Stroke Patients Only)       Balance Overall balance assessment: Needs assistance Sitting-balance support: No upper extremity supported, Feet unsupported Sitting balance-Leahy Scale: Fair     Standing balance support: Bilateral upper extremity supported, During functional activity Standing balance-Leahy Scale: Poor Standing balance comment: reliant on BUE support in stedy frame                            Cognition Arousal/Alertness: Awake/alert Behavior During Therapy: WFL for tasks assessed/performed, Anxious Overall Cognitive Status: Within Functional Limits for tasks assessed                                 General Comments: continued anxiety with mobility attempts but responds well to encouragement        Exercises      General Comments General comments (skin integrity, edema, etc.): Seen after  foley cath placement with noted output in bag during session. pt reporting relief      Pertinent Vitals/Pain Pain Assessment Pain Assessment: Faces Faces Pain Scale: Hurts little more Pain Location: LLE, back Pain Descriptors / Indicators: Grimacing, Guarding Pain Intervention(s): Monitored during session, Limited activity within patient's tolerance    Home  Living                          Prior Function            PT Goals (current goals can now be found in the care plan section) Progress towards PT goals: Progressing toward goals    Frequency    Min 4X/week      PT Plan Current plan remains appropriate    Co-evaluation PT/OT/SLP Co-Evaluation/Treatment: Yes Reason for Co-Treatment: Complexity of the patient's impairments (multi-system involvement);For patient/therapist safety;To address functional/ADL transfers PT goals addressed during session: Mobility/safety with mobility;Balance;Proper use of DME OT goals addressed during session: ADL's and self-care;Strengthening/ROM      AM-PAC PT "6 Clicks" Mobility   Outcome Measure  Help needed turning from your back to your side while in a flat bed without using bedrails?: A Little Help needed moving from lying on your back to sitting on the side of a flat bed without using bedrails?: A Lot Help needed moving to and from a bed to a chair (including a wheelchair)?: Total Help needed standing up from a chair using your arms (e.g., wheelchair or bedside chair)?: A Lot Help needed to walk in hospital room?: Total Help needed climbing 3-5 steps with a railing? : Total 6 Click Score: 10    End of Session Equipment Utilized During Treatment: Gait belt Activity Tolerance: Patient tolerated treatment well Patient left: in chair;with call bell/phone within reach Nurse Communication: Mobility status;Need for lift equipment PT Visit Diagnosis: Other abnormalities of gait and mobility (R26.89);Muscle weakness (generalized) (M62.81)     Time: 9169-4503 PT Time Calculation (min) (ACUTE ONLY): 24 min  Charges:  $Therapeutic Activity: 8-22 mins                     Mabeline Caras, PT, DPT Acute Rehabilitation Services  Personal: [facility] Office: [place] 09/25/2022, 12:53 PM

## 2022-09-25 NOTE — Plan of Care (Signed)

## 2022-09-25 NOTE — Progress Notes (Signed)
  Inpatient Rehabilitation Admissions Coordinator   Met with patient at bedside for rehab assessment. We discussed goals and expectations of a possible CIR admit. I await medical workup , treatment and progress to assist with planning rehab venue needs. I will follow. Please call me with any questions.   Danne Baxter, RN, MSN Rehab Admissions Coordinator [phone]

## 2022-09-25 NOTE — Progress Notes (Signed)
PROGRESS NOTE    Dana [name]  WUJ:[id number] DOB: Dec 27, 1965 DOA: 09/23/2022 PCP: [name], MD   Brief Narrative: 57 year old with past medical history significant for B12 deficiency on B12 shots, anxiety, back pain, right breast mass, chronic opioid use, DJD, fibromyalgia, headache, hypercholesterolemia, mild hypertension, obesity and tobacco use who presented to the ED on Monday afternoon for evaluation of progressive limb weakness since December 8, followed by acute worsening on Thursday, weakness is ascending and involve legs and arms.  Neurology has been consulted and concern for GB syndrome.  Failed attempt for LP by IR 1/9, plan to reattempt today.    Assessment & Plan:   Principal Problem:   Debility Active Problems:   Obesity   Low back pain with sciatica   HTN (hypertension)   B12 deficiency   Hypokalemia   UTI (urinary tract infection)   Hypomagnesemia   1-Ascending progressive weakness Neurology following concern for Ethelene Hal syndrome NIF -40 Plan to attempt   LP again.   2-Essential hypertension: On as needed hydralazine.   Obesity: Needs Lifestyle modification   Hypokalemia: Replaced.    UTI: Urine retention, could be related to neurology process. Foley catheter placed 1/10 Continue Rocephin  follow culture.  Growing E. coli and Klebsiella pneumoniae   B12 deficiency: Gets B12 shots as outpatient.   Hypomagnesemia: Replaced.    Estimated body mass index is 47.34 kg/m as calculated from the following:   Height as of this encounter: '5\' 4"'$  (1.626 m).   Weight as of this encounter: 125.1 kg.   DVT prophylaxis: SCD holding blood thinner for LP Code Status: Full code Family Communication: care discussed with patient.  Disposition Plan:  Status is: Inpatient Remains inpatient appropriate because: management of weakness    Consultants:  Neurology   Procedures:    Antimicrobials:    Subjective: She feels better been out of bed.  She was sitting recliner. She report weakness LE and upper extremities.   Objective: Vitals:   09/24/22 1700 09/24/22 2021 09/25/22 0006 09/25/22 0556  BP: (!) 160/96 (!) 140/82 (!) 150/76 (!) 155/97  Pulse: (!) 109 96 (!) 102 98  Resp: '19 18 18 18  '$ Temp: 98.6 F (37 C) 98.9 F (37.2 C) 98.5 F (36.9 C) 98.3 F (36.8 C)  TempSrc: Oral Oral Oral Oral  SpO2: 100% 97% 95% 100%  Weight:      Height:        Intake/Output Summary (Last 24 hours) at 09/25/2022 0816 Last data filed at 09/24/2022 2200 Gross per 24 hour  Intake 572.47 ml  Output --  Net 572.47 ml   Filed Weights   09/23/22 1244 09/23/22 1921 09/24/22 0241  Weight: 125.2 kg 125.2 kg 125.1 kg    Examination:  General exam: Appears calm and comfortable  Respiratory system: Clear to auscultation. Respiratory effort normal. Cardiovascular system: S1 & S2 heard, RRR. No JVD, murmurs, rubs, gallops or clicks. No pedal edema. Gastrointestinal system: Abdomen is nondistended, soft and nontender. No organomegaly or masses felt. Normal bowel sounds heard. Central nervous system: Alert and oriented. BL LE and upper extremities weakness, weakness worse LE>  Extremities: no edema  Data Reviewed: I have personally reviewed following labs and imaging studies  CBC: Recent Labs  Lab 09/23/22 1331 09/24/22 0410 09/24/22 1001  WBC 10.9* 10.1 8.1  NEUTROABS 7.9*  --  5.8  HGB 13.1 11.5* 11.9*  HCT 41.5 35.8* 37.7  MCV 93.7 93.2 95.2  PLT 395 342 324  Basic Metabolic Panel: Recent Labs  Lab 09/23/22 1331 09/23/22 2318 09/24/22 1001  NA 139 139 138  K 3.1* 3.1* 3.5  CL 98 101 104  CO2 '25 26 25  '$ GLUCOSE 115* 99 125*  BUN '10 7 6  '$ CREATININE 0.86 0.87 0.73  CALCIUM 8.7* 8.4* 8.1*  MG  --  1.5* 1.9  PHOS  --  3.6  --    GFR: Estimated Creatinine Clearance: 101.5 mL/min (by C-G formula based on SCr of 0.73 mg/dL). Liver Function Tests: Recent Labs  Lab 09/23/22 1331  AST 33  ALT 19  ALKPHOS 79  BILITOT 0.9   PROT 6.8  ALBUMIN 3.1*   No results for input(s): "LIPASE", "AMYLASE" in the last 168 hours. No results for input(s): "AMMONIA" in the last 168 hours. Coagulation Profile: Recent Labs  Lab 09/23/22 2318  INR 1.0   Cardiac Enzymes: No results for input(s): "CKTOTAL", "CKMB", "CKMBINDEX", "TROPONINI" in the last 168 hours. BNP (last 3 results) No results for input(s): "PROBNP" in the last 8760 hours. HbA1C: No results for input(s): "HGBA1C" in the last 72 hours. CBG: No results for input(s): "GLUCAP" in the last 168 hours. Lipid Profile: No results for input(s): "CHOL", "HDL", "LDLCALC", "TRIG", "CHOLHDL", "LDLDIRECT" in the last 72 hours. Thyroid Function Tests: Recent Labs    09/23/22 2306  TSH 3.882   Anemia Panel: Recent Labs    09/23/22 2306 09/23/22 2318  VITAMINB12 1,809*  --   FOLATE  --  6.6   Sepsis Labs: Recent Labs  Lab 09/23/22 2318  LATICACIDVEN 1.2    Recent Results (from the past 240 hour(s))  Resp panel by RT-PCR (RSV, Flu A&B, Covid) Anterior Nasal Swab     Status: None   Collection Time: 09/23/22  1:31 PM   Specimen: Anterior Nasal Swab  Result Value Ref Range Status   SARS Coronavirus 2 by RT PCR NEGATIVE NEGATIVE Final    Comment: (NOTE) SARS-CoV-2 target nucleic acids are NOT DETECTED.  The SARS-CoV-2 RNA is generally detectable in upper respiratory specimens during the acute phase of infection. The lowest concentration of SARS-CoV-2 viral copies this assay can detect is 138 copies/mL. A negative result does not preclude SARS-Cov-2 infection and should not be used as the sole basis for treatment or other patient management decisions. A negative result may occur with  improper specimen collection/handling, submission of specimen other than nasopharyngeal swab, presence of viral mutation(s) within the areas targeted by this assay, and inadequate number of viral copies(<138 copies/mL). A negative result must be combined with clinical  observations, patient history, and epidemiological information. The expected result is Negative.  Fact Sheet for Patients:  EntrepreneurPulse.com.au  Fact Sheet for Healthcare Providers:  IncredibleEmployment.be  This test is no t yet approved or cleared by the [place] FDA and  has been authorized for detection and/or diagnosis of SARS-CoV-2 by FDA under an Emergency Use Authorization (EUA). This EUA will remain  in effect (meaning this test can be used) for the duration of the COVID-19 declaration under Section 564(b)(1) of the Act, 21 U.S.C.section 360bbb-3(b)(1), unless the authorization is terminated  or revoked sooner.       Influenza A by PCR NEGATIVE NEGATIVE Final   Influenza B by PCR NEGATIVE NEGATIVE Final    Comment: (NOTE) The Xpert Xpress SARS-CoV-2/FLU/RSV plus assay is intended as an aid in the diagnosis of influenza from Nasopharyngeal swab specimens and should not be used as a sole basis for treatment. Nasal washings and aspirates are  unacceptable for Xpert Xpress SARS-CoV-2/FLU/RSV testing.  Fact Sheet for Patients: EntrepreneurPulse.com.au  Fact Sheet for Healthcare Providers: IncredibleEmployment.be  This test is not yet approved or cleared by the [place] FDA and has been authorized for detection and/or diagnosis of SARS-CoV-2 by FDA under an Emergency Use Authorization (EUA). This EUA will remain in effect (meaning this test can be used) for the duration of the COVID-19 declaration under Section 564(b)(1) of the Act, 21 U.S.C. section 360bbb-3(b)(1), unless the authorization is terminated or revoked.     Resp Syncytial Virus by PCR NEGATIVE NEGATIVE Final    Comment: (NOTE) Fact Sheet for Patients: EntrepreneurPulse.com.au  Fact Sheet for Healthcare Providers: IncredibleEmployment.be  This test is not yet approved or cleared by  the [place] FDA and has been authorized for detection and/or diagnosis of SARS-CoV-2 by FDA under an Emergency Use Authorization (EUA). This EUA will remain in effect (meaning this test can be used) for the duration of the COVID-19 declaration under Section 564(b)(1) of the Act, 21 U.S.C. section 360bbb-3(b)(1), unless the authorization is terminated or revoked.  Performed at [facility], [place] [address] [place], [place] [address]          Radiology Studies: DG [place] GUIDED LUMBAR PUNCTURE  Result Date: 09/24/2022 CLINICAL DATA:  57 year old female. Presented to the ED with progressive ascending weakness. Request is for lumbar puncture for further evaluation EXAM: DIAGNOSTIC LUMBAR PUNCTURE UNDER FLUOROSCOPIC GUIDANCE COMPARISON:  None Available. FLUOROSCOPY: Radiation Exposure Index (as provided by the fluoroscopic device): 514.20 mGy Kerma PROCEDURE: Informed consent was obtained from the patient prior to the procedure, including potential complications of headache, allergy, and pain. With the patient prone, the lower back was prepped with Betadine. 1% Lidocaine was used for local anesthesia. Lumbar puncture was several times by Rushie Nyhan NP and Dr. Kalman Jewels performed at the L4-L5 and L5-S1 level using a 20 gauge needle 6 in/. The multiple attempts were unsuccessful and no fluid was obtained. The patient tolerated the procedure well and there were no apparent complications. IMPRESSION: Unsuccessful lumbar puncture Electronically Signed   By: Marijo Sanes M.D.   On: 09/24/2022 15:48   MR Lumbar Spine W Wo Contrast  Result Date: 09/23/2022 CLINICAL DATA:  Initial evaluation for ataxia, weakness. EXAM: MRI LUMBAR SPINE WITHOUT AND WITH CONTRAST TECHNIQUE: Multiplanar and multiecho pulse sequences of the lumbar spine were obtained without and with intravenous contrast. CONTRAST:  31m GADAVIST GADOBUTROL 1 MMOL/ML IV SOLN COMPARISON:  Prior study from  04/16/2018. FINDINGS: Segmentation:  Examination moderately degraded by motion artifact. Standard segmentation. Lowest well-formed disc space labeled the L5-S1 level. Alignment: Physiologic with preservation of the normal lumbar lordosis. No significant listhesis. Vertebrae: Vertebral body height maintained without acute or chronic fracture. Bone marrow signal intensity diffusely heterogeneous without worrisome osseous lesion. Discogenic reactive endplate changes present about the L1-2 and L2-3 interspaces. No other abnormal marrow edema or enhancement. Conus medullaris and cauda equina: Conus extends to the L1 level. Conus and cauda equina appear normal. Paraspinal and other soft tissues: Paraspinous soft tissues demonstrate no acute finding. Asymmetric atrophy involving the left psoas muscle noted. Disc levels: L1-2: Disc bulge with disc desiccation. Superimposed left subarticular disc extrusion with inferior migration (series 22, image 11). Mild facet hypertrophy. Mild epidural lipomatosis. Resultant moderate canal with severe left lateral recess stenosis. Moderate left L1 foraminal narrowing. Right neural foramen remains patent. L2-3: Advanced degenerative intervertebral disc space narrowing with disc desiccation and diffuse disc bulge. Associated reactive endplate spurring. Prior  left hemi laminectomy. Moderate facet hypertrophy. Mild epidural lipomatosis. Residual mild canal with bilateral subarticular stenosis. Mild-to-moderate bilateral foraminal narrowing. L3-4: Mild diffuse disc bulge with disc desiccation. Central annular fissure. Moderate bilateral facet and ligament flavum hypertrophy with associated small joint effusions. Epidural lipomatosis. Resultant moderate spinal stenosis. Mild to moderate bilateral L3 foraminal narrowing. L4-5: Mild disc bulge with disc desiccation. Moderate bilateral facet arthrosis with small joint effusions. Epidural lipomatosis. Secondary compression of the distal thecal sac  with moderate to severe spinal stenosis. Foramina remain patent. L5-S1: Disc desiccation with mild disc bulge. Mild bilateral facet hypertrophy. Epidural lipomatosis. No significant spinal stenosis. Foramina remain patent. IMPRESSION: 1. Left subarticular disc extrusion with inferior migration at L1-2 with resultant moderate canal and severe left lateral recess stenosis, with moderate left L1 foraminal narrowing. 2. Multifactorial degenerative changes at L3-4 with resultant moderate spinal stenosis, with mild to moderate bilateral L3 foraminal narrowing. 3. Epidural lipomatosis with resultant moderate to severe spinal stenosis at L4-5. Electronically Signed   By: Jeannine Boga M.D.   On: 09/23/2022 22:59   MR THORACIC SPINE W WO CONTRAST  Result Date: 09/23/2022 CLINICAL DATA:  Initial evaluation for ataxia, weakness. EXAM: MRI THORACIC WITHOUT AND WITH CONTRAST TECHNIQUE: Multiplanar and multiecho pulse sequences of the thoracic spine were obtained without and with intravenous contrast. CONTRAST:  5m GADAVIST GADOBUTROL 1 MMOL/ML IV SOLN COMPARISON:  None Available. FINDINGS: Alignment:  Examination moderately degraded by motion artifact. Vertebral bodies normally aligned with preservation of the normal thoracic kyphosis. No listhesis. Vertebrae: Vertebral body height maintained without acute or chronic fracture. Bone marrow signal intensity diffusely heterogeneous without worrisome osseous lesion. Scattered discogenic reactive endplate changes present throughout the thoracic spine. No other abnormal marrow edema or enhancement. Cord:  Normal signal and morphology.  No abnormal enhancement. Paraspinal and other soft tissues: Unremarkable. Disc levels: Moderate degenerative spondylosis with multilevel degenerative disc bulging and reactive endplate spurring present throughout the thoracic spine. Changes are most pronounced within the midthoracic spine at T5-6 through T10-11. No high-grade spinal stenosis  by CT. Moderate bilateral foraminal narrowing present at T10-11. Foramina remain otherwise grossly patent. IMPRESSION: 1. Normal MRI appearance of the thoracic spinal cord. No cord compression or evidence for myelopathy. 2. Moderate degenerative spondylosis throughout the thoracic spine without high-grade spinal stenosis. Moderate bilateral foraminal narrowing at T10-11. Electronically Signed   By: BJeannine BogaM.D.   On: 09/23/2022 22:43   MR Cervical Spine W or Wo Contrast  Result Date: 09/23/2022 CLINICAL DATA:  Initial evaluation for ataxia, weakness. EXAM: MRI CERVICAL SPINE WITHOUT AND WITH CONTRAST TECHNIQUE: Multiplanar and multiecho pulse sequences of the cervical spine, to include the craniocervical junction and cervicothoracic junction, were obtained without and with intravenous contrast. CONTRAST:  137mGADAVIST GADOBUTROL 1 MMOL/ML IV SOLN COMPARISON:  Prior MRI report from 07/15/2016. FINDINGS: Alignment: Examination moderately degraded by motion artifact, limiting assessment. Straightening of the normal cervical lordosis.  No listhesis. Vertebrae: Prior ACDF at C4-C7. Vertebral body height maintained without acute or chronic fracture. Bone marrow signal intensity overall within normal limits. No worrisome osseous lesions. Discogenic reactive endplate changes noted about the T1-2 interspace. No other abnormal marrow edema. Cord: Normal signal and morphology.  No abnormal enhancement. Posterior Fossa, vertebral arteries, paraspinal tissues: Unremarkable. Disc levels: C2-C3: Minimal disc bulge with bilateral uncovertebral spurring. No significant spinal stenosis. Foramina remain patent. C3-C4: Mild disc bulge with bilateral uncovertebral spurring. No significant spinal stenosis. Foramina remain patent. C4-C5: Prior fusion. No residual spinal stenosis. Foramina remain patent.  C5-C6: Prior fusion. No residual spinal stenosis. Bilateral uncovertebral spurring with residual moderate left with  mild-to-moderate right C6 foraminal stenosis. C6-C7: Prior fusion. No residual spinal stenosis. Bilateral uncovertebral spurring with residual mild to moderate bilateral C7 foraminal stenosis. C7-T1: Degenerative intervertebral disc space narrowing with mild disc bulge. Right greater than left uncovertebral spurring. No significant spinal stenosis. Mild right C8 foraminal stenosis. Left neural foramina remains patent. IMPRESSION: 1. Normal MRI appearance of the cervical spinal cord. No cord compression or evidence for myelopathy. 2. Prior ACDF at C4-C7 without residual spinal stenosis. 3. Mild to moderate bilateral C6 and C7 and right C8 foraminal stenosis related to uncovertebral disease. Electronically Signed   By: Jeannine Boga M.D.   On: 09/23/2022 22:32   CT Head Wo Contrast  Result Date: 09/23/2022 CLINICAL DATA:  Headache EXAM: CT HEAD WITHOUT CONTRAST TECHNIQUE: Contiguous axial images were obtained from the base of the skull through the vertex without intravenous contrast. RADIATION DOSE REDUCTION: This exam was performed according to the departmental dose-optimization program which includes automated exposure control, adjustment of the mA and/or kV according to patient size and/or use of iterative reconstruction technique. COMPARISON:  None Available. FINDINGS: Brain: No evidence of acute infarction, hemorrhage, hydrocephalus, extra-axial collection or mass lesion/mass effect. Partially empty sella. Vascular: No hyperdense vessel or unexpected calcification. Skull: Normal. Negative for fracture or focal lesion. Sinuses/Orbits: No acute finding. Other: None. IMPRESSION: No specific etiology for headaches identified. Electronically Signed   By: Marin Roberts M.D.   On: 09/23/2022 17:01   DG Chest Portable 1 View  Result Date: 09/23/2022 CLINICAL DATA:  Numbness all over.  Weakness. EXAM: PORTABLE CHEST 1 VIEW COMPARISON:  04/23/2018. FINDINGS: Normal heart, mediastinum and hila. Clear lungs.  No  pleural effusion or pneumothorax. Skeletal structures are grossly intact. Stable changes from a previous anterior cervical spine fusion. IMPRESSION: No active disease. Electronically Signed   By: Lajean Manes M.D.   On: 09/23/2022 13:22        Scheduled Meds:  folic acid  1 mg Oral Daily   gabapentin  300 mg Oral TID   latanoprost  1 drop Both Eyes QHS   pantoprazole  40 mg Oral Daily   Continuous Infusions:  cefTRIAXone (ROCEPHIN)  IV 1 g (09/24/22 2128)     LOS: 1 day    Time spent: 35 minutes    Hanan Mcwilliams A Ji Feldner, MD [facility]   If 7PM-7AM, please contact night-coverage [email or web address]  09/25/2022, 8:16 AM

## 2022-09-26 DIAGNOSIS — R5381 Other malaise: Secondary | ICD-10-CM | POA: Diagnosis not present

## 2022-09-26 LAB — URINE CULTURE: Culture: >=100000 — AB

## 2022-09-26 LAB — COPPER, SERUM: Copper: 136 ug/dL (ref 80–158)

## 2022-09-26 LAB — VITAMIN E
Vitamin E (Alpha Tocopherol): 6.8 mg/L — ABNORMAL LOW (ref 7.0–25.1)
Vitamin E(Gamma Tocopherol): 2.3 mg/L (ref 0.5–5.5)

## 2022-09-26 MED ORDER — SENNOSIDES-DOCUSATE SODIUM 8.6-50 MG PO TABS
1.0000 | ORAL_TABLET | Freq: Two times a day (BID) | ORAL | Status: DC
  Administered 2022-09-26 – 2022-10-02 (×12): 1 via ORAL
  Filled 2022-09-26 (×12): qty 1

## 2022-09-26 MED ORDER — LIDOCAINE 5 % EX PTCH
1.0000 | MEDICATED_PATCH | CUTANEOUS | Status: DC
  Administered 2022-09-26 – 2022-10-02 (×7): 1 via TRANSDERMAL
  Filled 2022-09-26 (×7): qty 1

## 2022-09-26 NOTE — TOC Progression Note (Addendum)
Transition of Care [facility]) - Progression Note    Patient Details  Name: Dana [name] MRN: [id number] Date of Birth: 12-24-1965  [facility] CM/SW Contact  Tom-Johnson, Renea Ee, RN Phone Number: 09/26/2022, 3:38 PM  Clinical Narrative:     LP done yesterday 09/25/22 and indicative of proposed Guillain-Barr syndrome. Patient to start IVIG therapy for 5 days. CIR following for possible admit.  CM will continue to follow as patient progresses towards discharge.       Expected Discharge Plan: IP Rehab Facility Barriers to Discharge: Continued Medical Work up  Expected Discharge Plan and Services In-house Referral: Chaplain Discharge Planning Services: CM Consult Post Acute Care Choice: IP Rehab Living arrangements for the past 2 months: Single Family Home                           HH Arranged: NA [place] Agency: NA         Social Determinants of Health (SDOH) Interventions SDOH Screenings   Food Insecurity: No Food Insecurity (05/06/2022)  Transportation Needs: No Transportation Needs (05/06/2022)  Depression (PHQ2-9): Low Risk  (09/05/2022)  Financial Resource Strain: Low Risk  (05/06/2022)  Physical Activity: Inactive (05/06/2022)  Stress: No Stress Concern Present (05/06/2022)  Tobacco Use: Medium Risk (09/23/2022)    Readmission Risk Interventions     No data to display

## 2022-09-26 NOTE — Progress Notes (Signed)
PROGRESS NOTE    Dana [name]  JKD:[id number] DOB: Jan 31, 1966 DOA: 09/23/2022 PCP: Dana Morale, MD   Brief Narrative: 57 year old with past medical history significant for B12 deficiency on B12 shots, anxiety, back pain, right breast mass, chronic opioid use, DJD, fibromyalgia, headache, hypercholesterolemia, mild hypertension, obesity and tobacco use who presented to the ED on Monday afternoon for evaluation of progressive limb weakness since December 8, followed by acute worsening on Thursday, weakness is ascending and involve legs and arms.  Neurology has been consulted and concern for GB syndrome.  Failed attempt for LP by IR 1/9, underwent LP 1/10.    Assessment & Plan:   Principal Problem:   Debility Active Problems:   Obesity   Low back pain with sciatica   HTN (hypertension)   B12 deficiency   Hypokalemia   UTI (urinary tract infection)   Hypomagnesemia   1-Ascending progressive weakness Neurology following concern for Dana [name] syndrome NIF -40 Underwent LP 1/10: CSF fluid: WBC 3, RBC 17,750, total protein 155, glucose 70. Started on IV Immune- globuline. 1/10 for 5 days.  Some improvement on LE weakness.   2-Essential hypertension: On as needed hydralazine.   Obesity: Needs Lifestyle modification   Hypokalemia: Replaced.    UTI: Urine retention, could be related to neurology process. Foley catheter placed 1/10 Continue Rocephin  Urine culture:  Growing Dana [name] and Dana [name]   B12 deficiency: Gets B12 shots as outpatient.   Hypomagnesemia: Replaced.    Estimated body mass index is 48.02 kg/m as calculated from the following:   Height as of this encounter: '5\' 4"'$  (1.626 m).   Weight as of this encounter: 126.9 kg.   DVT prophylaxis: SCD holding blood thinner for LP Code Status: Full code Family Communication: care discussed with patient.  Disposition Plan:  Status is: Inpatient Remains inpatient appropriate because: management of  weakness    Consultants:  Neurology   Procedures:    Antimicrobials:    Subjective: She report back pain from LP.  Report LE pain   Objective: Vitals:   09/25/22 2250 09/25/22 2335 09/26/22 0443 09/26/22 0902  BP: (!) 167/66 (!) 154/93 (!) 144/75 (!) 152/83  Pulse: (!) 101 94 100 95  Resp: '18 16 18 16  '$ Temp: 99.5 F (37.5 C) 98.6 F (37 C) 98.5 F (36.9 C) 98.2 F (36.8 C)  TempSrc: Oral Oral  Oral  SpO2: 98% 100% 97% 98%  Weight:      Height:        Intake/Output Summary (Last 24 hours) at 09/26/2022 1103 Last data filed at 09/26/2022 0900 Gross per 24 hour  Intake 327.53 ml  Output 800 ml  Net -472.47 ml    Filed Weights   09/24/22 0241 09/25/22 1800 09/25/22 1952  Weight: 125.1 kg 126.3 kg 126.9 kg    Examination:  General exam: NAD Respiratory system: CTA Cardiovascular system: S 1, S 2 RRR Gastrointestinal system: BS Present, soft nt Central nervous system: Alert and oriented. BL LE and upper extremities weakness, weakness worse LE>  Extremities: no edema  Data Reviewed: I have personally reviewed following labs and imaging studies  CBC: Recent Labs  Lab 09/23/22 1331 09/24/22 0410 09/24/22 1001  WBC 10.9* 10.1 8.1  NEUTROABS 7.9*  --  5.8  HGB 13.1 11.5* 11.9*  HCT 41.5 35.8* 37.7  MCV 93.7 93.2 95.2  PLT 395 342 099    Basic Metabolic Panel: Recent Labs  Lab 09/23/22 1331 09/23/22 2318 09/24/22 1001  NA  139 139 138  K 3.1* 3.1* 3.5  CL 98 101 104  CO2 '25 26 25  '$ GLUCOSE 115* 99 125*  BUN '10 7 6  '$ CREATININE 0.86 0.87 0.73  CALCIUM 8.7* 8.4* 8.1*  MG  --  1.5* 1.9  PHOS  --  3.6  --     GFR: Estimated Creatinine Clearance: 102.4 mL/min (by C-G formula based on SCr of 0.73 mg/dL). Liver Function Tests: Recent Labs  Lab 09/23/22 1331  AST 33  ALT 19  ALKPHOS 79  BILITOT 0.9  PROT 6.8  ALBUMIN 3.1*    No results for input(s): "LIPASE", "AMYLASE" in the last 168 hours. No results for input(s): "AMMONIA" in the last  168 hours. Coagulation Profile: Recent Labs  Lab 09/23/22 2318  INR 1.0    Cardiac Enzymes: No results for input(s): "CKTOTAL", "CKMB", "CKMBINDEX", "TROPONINI" in the last 168 hours. BNP (last 3 results) No results for input(s): "PROBNP" in the last 8760 hours. HbA1C: No results for input(s): "HGBA1C" in the last 72 hours. CBG: No results for input(s): "GLUCAP" in the last 168 hours. Lipid Profile: No results for input(s): "CHOL", "HDL", "LDLCALC", "TRIG", "CHOLHDL", "LDLDIRECT" in the last 72 hours. Thyroid Function Tests: Recent Labs    09/23/22 2306  TSH 3.882    Anemia Panel: Recent Labs    09/23/22 2306 09/23/22 2318  VITAMINB12 1,809*  --   FOLATE  --  6.6    Sepsis Labs: Recent Labs  Lab 09/23/22 2318  LATICACIDVEN 1.2     Recent Results (from the past 240 hour(s))  Resp panel by RT-PCR (RSV, Flu A&B, Covid) Anterior Nasal Swab     Status: None   Collection Time: 09/23/22  1:31 PM   Specimen: Anterior Nasal Swab  Result Value Ref Range Status   SARS Coronavirus 2 by RT PCR NEGATIVE NEGATIVE Final    Comment: (NOTE) SARS-CoV-2 target nucleic acids are NOT DETECTED.  The SARS-CoV-2 RNA is generally detectable in upper respiratory specimens during the acute phase of infection. The lowest concentration of SARS-CoV-2 viral copies this assay can detect is 138 copies/mL. A negative result does not preclude SARS-Cov-2 infection and should not be used as the sole basis for treatment or other patient management decisions. A negative result may occur with  improper specimen collection/handling, submission of specimen other than nasopharyngeal swab, presence of viral mutation(s) within the areas targeted by this assay, and inadequate number of viral copies(<138 copies/mL). A negative result must be combined with clinical observations, patient history, and epidemiological information. The expected result is Negative.  Fact Sheet for Patients:   EntrepreneurPulse.com.au  Fact Sheet for Healthcare Providers:  IncredibleEmployment.be  This test is no t yet approved or cleared by the [place] FDA and  has been authorized for detection and/or diagnosis of SARS-CoV-2 by FDA under an Emergency Use Authorization (EUA). This EUA will remain  in effect (meaning this test can be used) for the duration of the COVID-19 declaration under Section 564(b)(1) of the Act, 21 U.S.C.section 360bbb-3(b)(1), unless the authorization is terminated  or revoked sooner.       Influenza A by PCR NEGATIVE NEGATIVE Final   Influenza B by PCR NEGATIVE NEGATIVE Final    Comment: (NOTE) The Xpert Xpress SARS-CoV-2/FLU/RSV plus assay is intended as an aid in the diagnosis of influenza from Nasopharyngeal swab specimens and should not be used as a sole basis for treatment. Nasal washings and aspirates are unacceptable for Xpert Xpress SARS-CoV-2/FLU/RSV testing.  Fact Sheet  for Patients: EntrepreneurPulse.com.au  Fact Sheet for Healthcare Providers: IncredibleEmployment.be  This test is not yet approved or cleared by the [place] FDA and has been authorized for detection and/or diagnosis of SARS-CoV-2 by FDA under an Emergency Use Authorization (EUA). This EUA will remain in effect (meaning this test can be used) for the duration of the COVID-19 declaration under Section 564(b)(1) of the Act, 21 U.S.C. section 360bbb-3(b)(1), unless the authorization is terminated or revoked.     Resp Syncytial Virus by PCR NEGATIVE NEGATIVE Final    Comment: (NOTE) Fact Sheet for Patients: EntrepreneurPulse.com.au  Fact Sheet for Healthcare Providers: IncredibleEmployment.be  This test is not yet approved or cleared by the [place] FDA and has been authorized for detection and/or diagnosis of SARS-CoV-2 by FDA under an Emergency Use  Authorization (EUA). This EUA will remain in effect (meaning this test can be used) for the duration of the COVID-19 declaration under Section 564(b)(1) of the Act, 21 U.S.C. section 360bbb-3(b)(1), unless the authorization is terminated or revoked.  Performed at [facility], [place] [address], [place], [place] [address]   Urine Culture     Status: Abnormal   Collection Time: 09/24/22  1:39 AM   Specimen: Urine, Clean Catch  Result Value Ref Range Status   Specimen Description URINE, CLEAN CATCH  Final   Special Requests   Final    NONE Performed at [facility], [place] [address], [name], [place] [address]    Culture (A)  Final    >=100,000 COLONIES/mL ESCHERICHIA [name] 80,000 COLONIES/mL Dana [name]    Report Status 09/26/2022 FINAL  Final   Organism ID, Bacteria ESCHERICHIA [name] (A)  Final   Organism ID, Bacteria Dana [name] (A)  Final      Susceptibility   Escherichia [name] - MIC*    AMPICILLIN <=2 SENSITIVE Sensitive     CEFAZOLIN <=4 SENSITIVE Sensitive     CEFEPIME <=0.12 SENSITIVE Sensitive     CEFTRIAXONE <=0.25 SENSITIVE Sensitive     CIPROFLOXACIN <=0.25 SENSITIVE Sensitive     GENTAMICIN <=1 SENSITIVE Sensitive     IMIPENEM <=0.25 SENSITIVE Sensitive     NITROFURANTOIN <=16 SENSITIVE Sensitive     TRIMETH/SULFA <=20 SENSITIVE Sensitive     AMPICILLIN/SULBACTAM <=2 SENSITIVE Sensitive     PIP/TAZO <=4 SENSITIVE Sensitive     * >=100,000 COLONIES/mL ESCHERICHIA [name]   Dana [name] - MIC*    AMPICILLIN >=32 RESISTANT Resistant     CEFAZOLIN <=4 SENSITIVE Sensitive     CEFEPIME <=0.12 SENSITIVE Sensitive     CEFTRIAXONE <=0.25 SENSITIVE Sensitive     CIPROFLOXACIN <=0.25 SENSITIVE Sensitive     GENTAMICIN <=1 SENSITIVE Sensitive     IMIPENEM <=0.25 SENSITIVE Sensitive     NITROFURANTOIN 32 SENSITIVE Sensitive     TRIMETH/SULFA <=20 SENSITIVE Sensitive     AMPICILLIN/SULBACTAM 4 SENSITIVE Sensitive     PIP/TAZO  <=4 SENSITIVE Sensitive     * 80,000 COLONIES/mL Dana [name]         Radiology Studies: DG FL GUIDED LUMBAR PUNCTURE  Result Date: 09/25/2022 CLINICAL DATA:  Guillain-Barre. Unsuccessful lumbar puncture yesterday. EXAM: DIAGNOSTIC LUMBAR PUNCTURE UNDER FLUOROSCOPIC GUIDANCE COMPARISON:  Lumbar MRI 09/23/2022 FLUOROSCOPY: Radiation Exposure Index (as provided by the fluoroscopic device): 19.2 mGy Kerma PROCEDURE: Informed consent was obtained from the patient prior to the procedure, including potential complications of headache, allergy, and pain. With the patient prone, the lower back was prepped with Betadine. 1% Lidocaine was used for local  anesthesia. Lumbar puncture initially attempted through a laminectomy defect on the left L2-3 but this was unsuccessful. Lumbar puncture was performed at the L5-S1 level using a 6 inch, a 20 gauge needle with return of blood tinged CSF CSF with very low pressure. The CSF began to clear but then flow CSF stopped. 1/2 ml of CSF were obtained for laboratory studies. The patient tolerated the procedure well and there were no apparent complications. IMPRESSION: Very difficult lumbar puncture with severe spinal stenosis multiple levels. Lumbar puncture successful at L5-S1 but only 1/2 mL of blood tinged CSF obtained for laboratory studies. Electronically Signed   By: Franchot Gallo M.D.   On: 09/25/2022 14:59   DG FL GUIDED LUMBAR PUNCTURE  Result Date: 09/24/2022 CLINICAL DATA:  57 year old female. Presented to the ED with progressive ascending weakness. Request is for lumbar puncture for further evaluation EXAM: DIAGNOSTIC LUMBAR PUNCTURE UNDER FLUOROSCOPIC GUIDANCE COMPARISON:  None Available. FLUOROSCOPY: Radiation Exposure Index (as provided by the fluoroscopic device): 514.20 mGy Kerma PROCEDURE: Informed consent was obtained from the patient prior to the procedure, including potential complications of headache, allergy, and pain. With the patient prone,  the lower back was prepped with Betadine. 1% Lidocaine was used for local anesthesia. Lumbar puncture was several times by Rushie Nyhan NP and Dr. Kalman Jewels performed at the L4-L5 and L5-S1 level using a 20 gauge needle 6 in/. The multiple attempts were unsuccessful and no fluid was obtained. The patient tolerated the procedure well and there were no apparent complications. IMPRESSION: Unsuccessful lumbar puncture Electronically Signed   By: Marijo Sanes M.D.   On: 09/24/2022 15:48        Scheduled Meds:  folic acid  1 mg Oral Daily   gabapentin  300 mg Oral TID   latanoprost  1 drop Both Eyes QHS   multivitamin with minerals  1 tablet Oral Daily   pantoprazole  40 mg Oral Daily   Continuous Infusions:  cefTRIAXone (ROCEPHIN)  IV 1 g (09/25/22 2316)   Immune Globulin 10%       LOS: 2 days    Time spent: 35 minutes    Aleshka Corney A Tjuana Vickrey, MD [facility]   If 7PM-7AM, please contact night-coverage [email or web address]  09/26/2022, 11:03 AM

## 2022-09-26 NOTE — Progress Notes (Signed)
NIF: > -40 x3 with good erffort.

## 2022-09-26 NOTE — Progress Notes (Signed)
Subjective/Interval: LP was repeated and successful yesterday. High protein and normal WBC seen in CSF, indicative of proposed Guillain-Barr syndrome.   Exam: Vitals:   09/26/22 0443 09/26/22 0902  BP: (!) 144/75 (!) 152/83  Pulse: 100 95  Resp: 18 16  Temp: 98.5 F (36.9 C) 98.2 F (36.8 C)  SpO2: 97% 98%   Gen: In bed, NAD Resp: non-labored breathing, no acute distress.  Abd: soft, nt  Neuro:  Mental Status: Awake, alert, oriented to person, place, time, situation. Good attention.  CN: EOMI, no ptosis, face symmetric, hearing intact to voice, shoulder shrug bilateral, midline tongue protrusion  Motor:  Reduced grip BUE. No pronator drift seen.  Able to lift BLE against gravity and hold against resistance, just off the bed. This is a slight improvement over her assessment on Tuesday.    DTR: Present in BL brachioradialis, rest absent. Also confounded by body habitus.  Impression: 57 year old female with ascending weakness and numbness. LP results and assessment most consistent with Guillain-Barr syndrome.   Recommendations: Continue IVIG for 2 g/kg, daily divided over 5 days PT/OT Neurology will continue to follow   Pt seen by Neuro NP/APP and later by MD. Note/plan to be edited by MD as needed.    Otelia Santee, DNP, AGACNP-BC [facility] Please use AMION for pager and EPIC for messaging  NEUROHOSPITALIST ADDENDUM Performed a face to face diagnostic evaluation.   I have reviewed the contents of history and physical exam as documented by PA/ARNP/Resident and agree with above documentation.  I have discussed and formulated the above plan as documented. Edits to the note have been made as needed.  Impression/Key exam findings/Plan: 57F with ascending weakness and numbness that is symmetric with auppressed reflexes with BL brachioradialis 1+, rest absent. Albuminocytologic dissociation on LP under fluoro. However, LP is bloody and thus a bit hard to  interpret. Overall, high suspicion for GBS.  Tolerating IVIG fine with no issues. Recommend PT/OT. NIFs/VC stable every 12 hours.  Donnetta Simpers, MD [facility] [phone]   If 7pm to 7am, please call on call as listed on AMION.

## 2022-09-26 NOTE — Progress Notes (Signed)
  Inpatient Rehabilitation Admissions Coordinator   I met with patient at bedside and spoke with her spouse by phone. I await further progress with therapy before pursuing insurance Auth with [facility] for possible CIR admit once IVIG completed.  Danne Baxter, RN, MSN Rehab Admissions Coordinator [phone] 09/26/2022 12:19 PM

## 2022-09-26 NOTE — Progress Notes (Signed)
Physical Therapy Treatment Patient Details Name: Dana [name] MRN: [id number] DOB: 05-23-1966 Today's Date: 09/26/2022   History of Present Illness Pt is a 57 y/o female who presented with progressive weakness and sensation changes, consistent with Guillain-Barr syndrome. MRI negative. Unsuccessful LP 1/9; plan for reattempt 1/10. PMH includes anxiety, DJD, fibromyalgia, HTN, tobacco use.    PT Comments    Eager to work with physical therapy today. Attempted sit<>stand from bed with RW however not quite able to succeed with this task yet. Required min assist for LLE support to get to EOB with HOB elevated. Sit<>stand mod assist using Stedy from elevated bed surface. Multiple sit<>stand from paddles on Stedy without physical support. Progressed with pre-gait training, static balance while using Stedy today. Patient will continue to benefit from skilled physical therapy services to further improve independence with functional mobility.    Recommendations for follow up therapy are one component of a multi-disciplinary discharge planning process, led by the attending physician.  Recommendations may be updated based on patient status, additional functional criteria and insurance authorization.  Follow Up Recommendations  Acute inpatient rehab (3hours/day)     Assistance Recommended at Discharge Frequent or constant Supervision/Assistance  Patient can return home with the following A lot of help with walking and/or transfers;A lot of help with bathing/dressing/bathroom;Assist for transportation;Help with stairs or ramp for entrance;Assistance with cooking/housework   Equipment Recommendations  Other (comment) (TBD)    Recommendations for Other Services Rehab consult     Precautions / Restrictions Precautions Precautions: Fall Restrictions Weight Bearing Restrictions: No     Mobility  Bed Mobility Overal bed mobility: Needs Assistance Bed Mobility: Supine to Sit     Supine to  sit: Min assist, HOB elevated     General bed mobility comments: Min asist for LLE support to bring OOB intermittently. Requires extra time. Cues for sequencing and weight shift to scoot.    Transfers Overall transfer level: Needs assistance Equipment used: Ambulation equipment used, Rolling walker (2 wheels) Transfers: Sit to/from Stand, Bed to chair/wheelchair/BSC Sit to Stand: Mod assist, From elevated surface           General transfer comment: Practiced sit<>stand with RW, bed elevated, but unable to stand succesfully today. Utilized Stedy and pt required mod assist for boost, cues for technique, able to grasp horizontal bar to pull up, bed elevated. Some reduced control with descent into recliner however maintains trunk control and adequately uses UEs to lower self into chair effortfully. Stood up several times unassisted from [place] paddles. Transfer via Lift Equipment: Stedy  Ambulation/Gait             Pre-gait activities: Practiced weight shifting, upright stance, and eventually progressed to static march on [place] today. Intermittent cues to push abdomen away from horizontal rail for more upright symmetrical stance. LEs did not buckle until instructed to attempt mini-squat.     Stairs             Wheelchair Mobility    Modified Rankin (Stroke Patients Only)       Balance Overall balance assessment: Needs assistance Sitting-balance support: No upper extremity supported, Feet unsupported Sitting balance-Leahy Scale: Fair     Standing balance support: Bilateral upper extremity supported, During functional activity Standing balance-Leahy Scale: Poor Standing balance comment: reliant on BUE support in stedy frame                            Cognition Arousal/Alertness:  Awake/alert Behavior During Therapy: WFL for tasks assessed/performed, Anxious Overall Cognitive Status: Within Functional Limits for tasks assessed                                           Exercises General Exercises - Lower Extremity Ankle Circles/Pumps: AROM, Both, 20 reps, Seated Quad Sets: Strengthening, Both, 10 reps, Seated Gluteal Sets: Strengthening, Both, 10 reps, Seated    General Comments        Pertinent Vitals/Pain Pain Assessment Pain Assessment: Faces Faces Pain Scale: Hurts even more Pain Location: lower back at LP site Pain Descriptors / Indicators: Grimacing, Guarding, Sore Pain Intervention(s): Limited activity within patient's tolerance, Monitored during session, Repositioned    Home Living                          Prior Function            PT Goals (current goals can now be found in the care plan section) Acute Rehab PT Goals Patient Stated Goal: regain her strength PT Goal Formulation: With patient Time For Goal Achievement: 10/08/22 Potential to Achieve Goals: Good Progress towards PT goals: Progressing toward goals    Frequency    Min 4X/week      PT Plan Current plan remains appropriate    Co-evaluation              AM-PAC PT "6 Clicks" Mobility   Outcome Measure  Help needed turning from your back to your side while in a flat bed without using bedrails?: A Little Help needed moving from lying on your back to sitting on the side of a flat bed without using bedrails?: A Little Help needed moving to and from a bed to a chair (including a wheelchair)?: Total Help needed standing up from a chair using your arms (e.g., wheelchair or bedside chair)?: A Lot Help needed to walk in hospital room?: Total Help needed climbing 3-5 steps with a railing? : Total 6 Click Score: 11    End of Session Equipment Utilized During Treatment: Gait belt Activity Tolerance: Patient tolerated treatment well Patient left: in chair;with call bell/phone within reach Nurse Communication: Mobility status;Need for lift equipment PT Visit Diagnosis: Other abnormalities of gait and mobility  (R26.89);Muscle weakness (generalized) (M62.81);Other symptoms and signs involving the nervous system (R29.898)     Time: 2841-3244 PT Time Calculation (min) (ACUTE ONLY): 32 min  Charges:  $Therapeutic Activity: 8-22 mins $Neuromuscular Re-education: 8-22 mins                     Candie Mile, PT, DPT Physical Therapist Acute Rehabilitation Services [place] 09/26/2022, 2:34 PM

## 2022-09-27 DIAGNOSIS — G610 Guillain-Barre syndrome: Secondary | ICD-10-CM | POA: Diagnosis not present

## 2022-09-27 DIAGNOSIS — R5381 Other malaise: Secondary | ICD-10-CM | POA: Diagnosis not present

## 2022-09-27 LAB — BASIC METABOLIC PANEL
Anion gap: 10 (ref 5–15)
BUN: 7 mg/dL (ref 6–20)
CO2: 24 mmol/L (ref 22–32)
Calcium: 8.3 mg/dL — ABNORMAL LOW (ref 8.9–10.3)
Chloride: 102 mmol/L (ref 98–111)
Creatinine, Ser: 0.76 mg/dL (ref 0.44–1.00)
GFR, Estimated: >60 mL/min (ref >60)
Glucose, Bld: 103 mg/dL — ABNORMAL HIGH (ref 70–99)
Potassium: 4.1 mmol/L (ref 3.5–5.1)
Sodium: 136 mmol/L (ref 135–145)

## 2022-09-27 LAB — CBC
HCT: 34.2 % — ABNORMAL LOW (ref 36.0–46.0)
Hemoglobin: 11.1 g/dL — ABNORMAL LOW (ref 12.0–15.0)
MCH: 30 pg (ref 26.0–34.0)
MCHC: 32.5 g/dL (ref 30.0–36.0)
MCV: 92.4 fL (ref 80.0–100.0)
Platelets: 297 10*3/uL (ref 150–400)
RBC: 3.7 MIL/uL — ABNORMAL LOW (ref 3.87–5.11)
RDW: 15 % (ref 11.5–15.5)
WBC: 5.3 10*3/uL (ref 4.0–10.5)
nRBC: 0 % (ref 0.0–0.2)

## 2022-09-27 LAB — MAGNESIUM: Magnesium: 1.7 mg/dL (ref 1.7–2.4)

## 2022-09-27 MED ORDER — MAGNESIUM SULFATE 2 GM/50ML IV SOLN
2.0000 g | Freq: Once | INTRAVENOUS | Status: AC
  Administered 2022-09-27: 2 g via INTRAVENOUS
  Filled 2022-09-27: qty 50

## 2022-09-27 MED ORDER — CHLORHEXIDINE GLUCONATE CLOTH 2 % EX PADS
6.0000 | MEDICATED_PAD | Freq: Every day | CUTANEOUS | Status: DC
  Administered 2022-09-29 – 2022-09-30 (×2): 6 via TOPICAL

## 2022-09-27 NOTE — PMR Pre-admission (Signed)
PMR Admission Coordinator Pre-Admission Assessment  [name] is an 57 y.o., female MRN: [id number] DOB: Jan 24, 1966 Height: '5\' 4"'$  (162.6 cm) Weight: 126.9 kg  Insurance Information HMO:     PPO:      PCP:      IPA:      80/20:      OTHER:  PRIMARY: [place] [employer]      Policy#: [id number]      Subscriber: spouse CM Name: Aron Baba      Phone#: [phone]     Fax#: [phone] Pre-Cert#: [id number]  approved for 6 days with updates due to [place] phone [phone] ext [phone] fax [phone]    Employer:  Benefits:  Phone #: [phone]     Name: 1/12 Eff. Date: 09/16/22     Deduct: none      Out of Pocket Max: NONE CIR: 80%      SNF: 80% 60 days Outpatient: 80%     Co-Pay: 20 combined visits Home Health: 80%      Co-Pay: 60 combined visits DME: 80%     Co-Pay: 20% Providers: in network  SECONDARY: Medicare a and b      Policy#: [id number] Passport one source 1/12 a 11/14/2017 and b 03/17/2019  Financial Counselor:       Phone#:   The "Data Collection Information Summary" for patients in Inpatient Rehabilitation Facilities with attached "Privacy Act [place] Records" was provided and verbally reviewed with: Patient and Family  Emergency Contact Information Contact Information     Name Relation Home Work Mobile   [place] Jr.,William [place] Spouse [place] Daughter [phone]        Current Medical History  Patient Admitting Diagnosis: GBS  History of Present Illness: 57 year old female with history of IBS, fibromyalgia, HTN, HLD, chronic back pain, B12 deficiency, lumbar radiculopathy presented on 09/23/21 with progressive weakness and numbness since December 8.  Neurology consulted. Numbness of lips, neck and dropping objects. Underwent LP for concerns for Gilliam Barre syndrome. Started on IVIG for 5 days. Foley catheter placed. Urine few e coli and klebsiella pneumoniae and began Rocephin.   Complete NIHSS TOTAL:  0  Patient's medical record from [facility] has been reviewed by the rehabilitation admission coordinator and physician.  Past Medical History  Past Medical History:  Diagnosis Date   Anxiety    Back pain with radiation    Breast mass, right    Chronic pain    Chronic, continuous use of opioids    Complication of anesthesia 04/07/14   Allergic reaction to Lisinopril immediately following surgery   DJD (degenerative joint disease)    Fibromyalgia    Groin abscess    Headache(784.0)    History of IBS    Hypercholesterolemia    IBS (irritable bowel syndrome)    Lactose intolerance    Mild hypertension    Obesity    Tobacco use disorder    Umbilical hernia    Symptomatic   Has the patient had major surgery during 100 days prior to admission? No  Family History   family history includes Breast cancer (age of onset: 30) in her maternal aunt; Diabetes in her father and mother; Prostate cancer in her paternal grandfather.  Current Medications  Current Facility-Administered Medications:    albuterol (PROVENTIL) (2.5 MG/3ML) 0.083% nebulizer solution 3 mL, 3 mL, Inhalation, Q6H PRN, Doutova, Anastassia, MD   Chlorhexidine Gluconate Cloth 2 % PADS 6 each, 6 each, Topical,  Daily, Regalado, Belkys A, MD, 6 each at 09/30/22 1523   cyclobenzaprine (FLEXERIL) tablet 10 mg, 10 mg, Oral, TID PRN, Roel Cluck, Anastassia, MD, 10 mg at 81/44/81 8563   folic acid (FOLVITE) tablet 1 mg, 1 mg, Oral, Daily, Doutova, Anastassia, MD, 1 mg at 10/02/22 0850   furosemide (LASIX) tablet 40 mg, 40 mg, Oral, BID, Regalado, Belkys A, MD, 40 mg at 10/02/22 0850   gabapentin (NEURONTIN) capsule 600 mg, 600 mg, Oral, TID, Lovey Newcomer C, NP, 600 mg at 10/02/22 0850   HYDROmorphone (DILAUDID) tablet 4 mg, 4 mg, Oral, Q6H PRN, Doutova, Anastassia, MD, 4 mg at 10/01/22 2120   latanoprost (XALATAN) 0.005 % ophthalmic solution 1 drop, 1 drop, Both Eyes, QHS, Doutova, Anastassia, MD, 1 drop at 10/01/22 2121    lidocaine (LIDODERM) 5 % 1 patch, 1 patch, Transdermal, Q24H, Regalado, Belkys A, MD, 1 patch at 10/01/22 1321   metoprolol tartrate (LOPRESSOR) tablet 50 mg, 50 mg, Oral, BID, Regalado, Belkys A, MD, 50 mg at 10/02/22 0850   multivitamin with minerals tablet 1 tablet, 1 tablet, Oral, Daily, Regalado, Belkys A, MD, 1 tablet at 10/02/22 0850   pantoprazole (PROTONIX) EC tablet 40 mg, 40 mg, Oral, Daily, Doutova, Anastassia, MD, 40 mg at 10/02/22 0849   senna-docusate (Senokot-S) tablet 1 tablet, 1 tablet, Oral, BID, Regalado, Belkys A, MD, 1 tablet at 10/02/22 0849   vitamin B-12 (CYANOCOBALAMIN) tablet 100 mcg, 100 mcg, Oral, Daily, Regalado, Belkys A, MD, 100 mcg at 10/01/22 1047  Patients Current Diet:  Diet Order             Diet Heart Room service appropriate? Yes; Fluid consistency: Thin  Diet effective now                  Precautions / Restrictions Precautions Precautions: Fall Restrictions Weight Bearing Restrictions: No   Has the patient had 2 or more falls or a fall with injury in the past year? No  Prior Activity Level Limited Community (1-2x/wk): Mod I with rollator in home and cane in community; drives  Prior Functional Level Self Care: Did the patient need help bathing, dressing, using the toilet or eating? Independent  Indoor Mobility: Did the patient need assistance with walking from room to room (with or without device)? Independent  Stairs: Did the patient need assistance with internal or external stairs (with or without device)? Independent  Functional Cognition: Did the patient need help planning regular tasks such as shopping or remembering to take medications? Independent  Patient Information Are you of Hispanic, Latino/a,or Spanish origin?: A. No, not of Hispanic, Latino/a, or Spanish origin What is your race?: B. Black or African American Do you need or want an interpreter to communicate with a doctor or health care staff?: 0. No  Patient's Response  To:  Health Literacy and Transportation Is the patient able to respond to health literacy and transportation needs?: Yes Health Literacy - How often do you need to have someone help you when you read instructions, pamphlets, or other written material from your doctor or pharmacy?: Never In the past 12 months, has lack of transportation kept you from medical appointments or from getting medications?: No In the past 12 months, has lack of transportation kept you from meetings, work, or from getting things needed for daily living?: No  Home Assistive Devices / [place] Devices/Equipment: Environmental consultant (specify type) Home Equipment: Rollator (4 wheels)  Prior Device Use: Indicate devices/aids used by the patient prior to current  illness, exacerbation or injury?  Rollator in home and can [employer]  Current Functional Level Cognition  Overall Cognitive Status: Within Functional Limits for tasks assessed Orientation Level: Oriented X4, Disoriented to time General Comments: Anxious in standing    Extremity Assessment (includes Sensation/Coordination)  Upper Extremity Assessment: RUE deficits/detail, LUE deficits/detail RUE Deficits / Details: sensory motor impairment affected fine motor coordination however functional; does bes when using visual feedback to improve movement patters RUE Coordination: decreased fine motor, decreased gross motor LUE Deficits / Details: similar to R LUE Coordination: decreased fine motor, decreased gross motor  Lower Extremity Assessment: Defer to PT evaluation RLE Deficits / Details: ROM WFL; MMT: hip 3/5, ankle and knee 4-/5; Sensation: Pt initially reports diminished and then reports numb.  When tested could feel some on thigh but diminished and no other sensation.  Reports buttock and genitals also numb.  No prioprioception RLE Sensation: decreased light touch LLE Deficits / Details: ROM WFL; MMT: hip 1/5, ankle and knee 3/5; Sensation: Pt initially  reports diminished and then reports numb. When tested could feel some on thigh but diminished and no other sensation. Reports buttock and genitals also numb. No prioprioception LLE Sensation: decreased light touch    ADLs  Overall ADL's : Needs assistance/impaired Eating/Feeding: Set up, Supervision/ safety Eating/Feeding Details (indicate cue type and reason): with use of red tubing, attempted use of fork without foam.  Increased spillage and dropping of untensils. Grooming: Set up, Supervision/safety, Sitting Grooming Details (indicate cue type and reason): reports she was able to brush teeth sitting on edge of stretcher earlier Upper Body Bathing: Minimal assistance Lower Body Bathing: Moderate assistance, Bed level Lower Body Bathing Details (indicate cue type and reason): assist for peri care in standing with Stedy. pt likely able to assist with upper LE and partial lower LE seated Upper Body Dressing : Sitting, Set up Lower Body Dressing: Maximal assistance, Bed level Toileting- Clothing Manipulation and Hygiene: Total assistance (foley) General ADL Comments: will benefit form use of AE for LB ADL    Mobility  Overal bed mobility: Needs Assistance Bed Mobility: Supine to Sit Rolling: Min assist Supine to sit: Min assist, HOB elevated Sit to supine: Mod assist (A to lift BLE back onto bed) General bed mobility comments: In recliner    Transfers  Overall transfer level: Needs assistance Equipment used: Rolling walker (2 wheels) Transfers: Sit to/from Stand Sit to Stand: Mod assist Bed to/from chair/wheelchair/BSC transfer type:: Via Lift equipment Transfer via Lift Equipment: [place] transfer comment: Mod assist, nearly min for boost to stand from recliner today. Cues for hand placement. Initially with erratic control for RW, seemingly more anxiety invoked than physical however shows posterior instability, bracing knees on chair. Cues for forward lean onto RW while  maintaining upright posture. Greater stabiity noted.    Ambulation / Gait / Stairs / Wheelchair Mobility  Ambulation/Gait Ambulation/Gait assistance: Mod assist Gait Distance (Feet): 3 Feet (x2) Assistive device: Rolling walker (2 wheels) Gait Pattern/deviations: Step-to pattern, Decreased stride length, Shuffle, Ataxic, Wide base of support General Gait Details: Following pre-gait activity, pt tolerated advancing foot foward and taking steps. Demonstrates erratic control of RW needing mod assist for balance and placement of AD. Performed several steps backwards towards chair with additional assist and cues for sequencing, poor control of RW, overshooting placement frequently. Patient able to complete 2 bouts of forward and backwards with chair being pulled along by therapist. Gait velocity: slow Gait velocity interpretation: <1.31 ft/sec, indicative of household  ambulator Pre-gait activities: Standing weight shift, upright posture (first day she has stood upright without excessive forward flexion.) stationary march with fair foot clearance. Very anxious.    Posture / Balance Balance Overall balance assessment: Needs assistance Sitting-balance support: Bilateral upper extremity supported, Feet supported Sitting balance-Leahy Scale: Fair Standing balance support: Bilateral upper extremity supported, During functional activity Standing balance-Leahy Scale: Poor Standing balance comment: reliant on BUE support    Special needs/care consideration Fall precautions   Previous Home Environment  Living Arrangements: Spouse/significant other  Lives With: Spouse Available Help at Discharge: Family, Available 24 hours/day (4 adult children and spouse) Type of Home: House Home Layout: One level Home Access: Ramped entrance Bathroom Shower/Tub: Chiropodist: Standard Bathroom Accessibility: Yes How Accessible: Accessible via walker [place]: No  Discharge Living  Setting Plans for Discharge Living Setting: Patient's home, Lives with (comment) (spouse) Type of Home at Discharge: House Discharge Home Layout: One level Discharge Home Access: [place] entrance Discharge Bathroom Shower/Tub: [place] unit Discharge Bathroom Toilet: Standard Discharge Bathroom Accessibility: Yes How Accessible: Accessible via walker Does the patient have any problems obtaining your medications?: No  Social/Family/Support Systems Patient Roles: Spouse, Parent Contact Information: spouse, Gwyndolyn Saxon Anticipated Caregiver: spouse and 4 adult children Anticipated Caregiver's Contact Information: see contacts Ability/Limitations of Caregiver: none Caregiver Availability: 24/7 Discharge Plan Discussed with Primary Caregiver: Yes Is Caregiver In Agreement with Plan?: Yes Does Caregiver/Family have Issues with Lodging/Transportation while Pt is in Rehab?: No  Goals Patient/Family Goal for Rehab: supervision to min asisst with PT and OT Expected length of stay: ELOS 10 to 14 days Pt/Family Agrees to Admission and willing to participate: Yes Program Orientation Provided & Reviewed with Pt/Caregiver Including Roles  & Responsibilities: Yes  Decrease burden of Care through IP rehab admission: n/a  Possible need for SNF placement upon discharge: not anticipated  Patient Condition: I have reviewed medical records from [facility], spoken with  patient and spouse. I met with patient at the bedside for inpatient rehabilitation assessment.  Patient will benefit from ongoing PT and OT, can actively participate in 3 hours of therapy a day 5 days of the week, and can make measurable gains during the admission.  Patient will also benefit from the coordinated team approach during an Inpatient Acute Rehabilitation admission.  The patient will receive intensive therapy as well as Rehabilitation physician, nursing, social worker, and care management interventions.  Due to bladder  management, bowel management, safety, skin/wound care, disease management, medication administration, pain management, and patient education the patient requires 24 hour a day rehabilitation nursing.  The patient is currently mod assist overall with mobility and basic ADLs.  Discharge setting and therapy post discharge at home with home health is anticipated.  Patient has agreed to participate in the Acute Inpatient Rehabilitation Program and will admit today.  Preadmission Screen Completed By:  Cleatrice Burke, 10/02/2022 9:59 AM ______________________________________________________________________   Discussed status with Dr. Ranell Patrick on 10/02/22 at [phone] and received approval for admission today.  Admission Coordinator: Dorita Sciara, RN, time 2353 Date 10/02/22   Assessment/Plan: Diagnosis: Does the need for close, 24 hr/day Medical supervision in concert with the patient's rehab needs make it unreasonable for this patient to be served in a less intensive setting? Yes Co-Morbidities requiring supervision/potential complications: IBS, fibromyalgia, HTN, HLD, chronic back pain, B12 deficiency Due to bladder management, bowel management, safety, skin/wound care, disease management, medication administration, pain management, and patient education, does the patient require  24 hr/day rehab nursing? Yes Does the patient require coordinated care of a physician, rehab nurse, PT, OT to address physical and functional deficits in the context of the above medical diagnosis(es)? Yes Addressing deficits in the following areas: balance, endurance, locomotion, strength, transferring, bowel/bladder control, bathing, dressing, feeding, grooming, toileting, and psychosocial support Can the patient actively participate in an intensive therapy program of at least 3 hrs of therapy 5 days a week? Yes The potential for patient to make measurable gains while on inpatient rehab is  excellent Anticipated functional outcomes upon discharge from inpatient rehab: modified independent PT, modified independent OT, independent SLP Estimated rehab length of stay to reach the above functional goals is: 10-14 days Anticipated discharge destination: Home 10. Overall Rehab/Functional Prognosis: excellent   MD Signature: Leeroy Cha, MD

## 2022-09-27 NOTE — Progress Notes (Addendum)
Physical Therapy Treatment Patient Details Name: Dana [name] MRN: [id number] DOB: Aug 31, 1966 Today's Date: 09/27/2022   History of Present Illness Pt is a 57 y/o female who presented with progressive weakness and sensation changes, consistent with Guillain-Barr syndrome. MRI negative. LP results and assessment most consistent with Guillain-Barr syndrome.  PMH includes anxiety, DJD, fibromyalgia, HTN, tobacco use.    PT Comments    Great session with physical therapy, progressed from standing with Stedy from mod to min assist. First time able to stand with mod assist using RW for support, pressing up from recliner. Pt leaning forward heavily, having difficulty fully extending at hips. Provided Lt knee block but seemed to manage keeping knees locked while standing. Gets very anxious with RW, much better boost and strength pulling self into standing position with Stedy, progressed from Mod to min assist today. Having a difficult time getting a consistent MMT for LEs. Patient will continue to benefit from skilled physical therapy services to further improve independence with functional mobility.   Recommendations for follow up therapy are one component of a multi-disciplinary discharge planning process, led by the attending physician.  Recommendations may be updated based on patient status, additional functional criteria and insurance authorization.  Follow Up Recommendations  Acute inpatient rehab (3hours/day)     Assistance Recommended at Discharge Frequent or constant Supervision/Assistance  Patient can return home with the following A lot of help with walking and/or transfers;A lot of help with bathing/dressing/bathroom;Assist for transportation;Help with stairs or ramp for entrance;Assistance with cooking/housework   Equipment Recommendations  Other (comment) (TBD)    Recommendations for Other Services Rehab consult     Precautions / Restrictions Precautions Precautions:  Fall Restrictions Weight Bearing Restrictions: No     Mobility  Bed Mobility Overal bed mobility: Needs Assistance Bed Mobility: Supine to Sit     Supine to sit: Min assist, HOB elevated     General bed mobility comments: Min assist for LLE support to bring to EOB, cues for sequencing. Obvious improvement in functional strength with turning and scooting in bed today.    Transfers Overall transfer level: Needs assistance Equipment used: Ambulation equipment used, Rolling walker (2 wheels) Transfers: Sit to/from Stand, Bed to chair/wheelchair/BSC Sit to Stand: Mod assist, Min assist           General transfer comment: Mod assist progressing to min assist with Stedy for boost to stand but notable improvement with effort today, pulling better through UEs and using momentum for trunk to rock forward, nearly min assist level with stedy. The initial transfer was performed from bed at lowest setting (also an improvement.) Practiced transfers with [facility] on/off BSC, using horiziontal rail to pull up. From recliner, practiced sit<>stand with stedy x2. Progressed to sit<>stand with RW x2 pressing up from chair with Lt knee block from therapist and mod assist for boost into standing, requires assist to remain balanced, leaning forward heavily. Transfer via Lift Equipment: Stedy  Ambulation/Gait             Pre-gait activities: Pre gait using Stedy. Able to take knees away from pad, progressed with upright stance, keeping abdomen away from horiziontal rail intermittently. Stood with RW for approx 30 sec. with knee block     Stairs             Wheelchair Mobility    Modified Rankin (Stroke Patients Only)       Balance Overall balance assessment: Needs assistance Sitting-balance support: No upper extremity supported, Feet unsupported Sitting  balance-Leahy Scale: Fair     Standing balance support: Bilateral upper extremity supported, During functional activity Standing  balance-Leahy Scale: Poor Standing balance comment: reliant on BUE support with stedy and RW                            Cognition Arousal/Alertness: Awake/alert Behavior During Therapy: WFL for tasks assessed/performed Overall Cognitive Status: Within Functional Limits for tasks assessed                                          Exercises      General Comments        Pertinent Vitals/Pain Pain Assessment Pain Assessment: No/denies pain Pain Intervention(s): Monitored during session    Home Living                          Prior Function            PT Goals (current goals can now be found in the care plan section) Acute Rehab PT Goals Patient Stated Goal: regain her strength PT Goal Formulation: With patient Time For Goal Achievement: 10/08/22 Potential to Achieve Goals: Good Progress towards PT goals: Progressing toward goals    Frequency    Min 4X/week      PT Plan Current plan remains appropriate    Co-evaluation              AM-PAC PT "6 Clicks" Mobility   Outcome Measure  Help needed turning from your back to your side while in a flat bed without using bedrails?: A Little Help needed moving from lying on your back to sitting on the side of a flat bed without using bedrails?: A Little Help needed moving to and from a bed to a chair (including a wheelchair)?: Total Help needed standing up from a chair using your arms (e.g., wheelchair or bedside chair)?: A Lot Help needed to walk in hospital room?: Total Help needed climbing 3-5 steps with a railing? : Total 6 Click Score: 11    End of Session Equipment Utilized During Treatment: Gait belt Activity Tolerance: Patient tolerated treatment well Patient left: in chair;with call bell/phone within reach Nurse Communication: Mobility status;Need for lift equipment PT Visit Diagnosis: Other abnormalities of gait and mobility (R26.89);Muscle weakness (generalized)  (M62.81);Other symptoms and signs involving the nervous system (R29.898)     Time: 0539-7673 PT Time Calculation (min) (ACUTE ONLY): 44 min  Charges:  $Therapeutic Activity: 23-37 mins $Neuromuscular Re-education: 8-22 mins                     Candie Mile, PT, DPT Physical Therapist Acute Rehabilitation Services [place] 09/27/2022, 3:02 PM

## 2022-09-27 NOTE — Progress Notes (Signed)
Occupational Therapy Treatment Patient Details Name: Dana [name] MRN: [id number] DOB: 1965-11-09 Today's Date: 09/27/2022   History of present illness Pt is a 57 y/o female who presented with progressive weakness and sensation changes, consistent with Guillain-Barr syndrome. MRI negative. LP results and assessment most consistent with Guillain-Barr syndrome.  PMH includes anxiety, DJD, fibromyalgia, HTN, tobacco use.   OT comments  Dana [name] is making steady progress and is very motivated to become more independent. Requires Min to mod A with bed mobility and mod to Max A with LB ADL tasks at bed level. Son present and educated how to work with his Mom at bed level on exercise and ADL tasks. Pt/family very appreciative. Excellent candidate for intensive rehab at [facility]. Will continue to follow.    Recommendations for follow up therapy are one component of a multi-disciplinary discharge planning process, led by the attending physician.  Recommendations may be updated based on patient status, additional functional criteria and insurance authorization.    Follow Up Recommendations  Acute inpatient rehab (3hours/day)     Assistance Recommended at Discharge Frequent or constant Supervision/Assistance  Patient can return home with the following  Two people to help with walking and/or transfers;A lot of help with bathing/dressing/bathroom;Assistance with cooking/housework;Assist for transportation;Help with stairs or ramp for entrance   Equipment Recommendations  BSC/3in1;Wheelchair (measurements OT);Wheelchair cushion (measurements OT);Tub/shower bench (drop arm)    Recommendations for Other Services Rehab consult    Precautions / Restrictions Precautions Precautions: Fall       Mobility Bed Mobility Overal bed mobility: Needs Assistance Bed Mobility: Supine to Sit, Sit to Supine, Rolling Rolling: Min assist   Supine to sit: Min assist Sit to supine: Mod assist (A to lift BLE back  onto bed); heavy use of rails        Transfers Overall transfer level: Needs assistance                 General transfer comment: unable to complete sit - stand with +1 however able to scoot laterally on bed with min A and use of bed pad; addressed importance of anterior weight shift to help with mobility     Balance     Sitting balance-Leahy Scale: Fair                                     ADL either performed or assessed with clinical judgement   ADL Overall ADL's : Needs assistance/impaired Eating/Feeding: Set up;Supervision/ safety Eating/Feeding Details (indicate cue type and reason): wiht use of red tubing Grooming: Set up;Supervision/safety;Sitting   Upper Body Bathing: Minimal assistance   Lower Body Bathing: Moderate assistance;Bed level   Upper Body Dressing : Minimal assistance;Sitting   Lower Body Dressing: Maximal assistance;Bed level       Toileting- Clothing Manipulation and Hygiene: Total assistance (foley)         General ADL Comments: will benefit form use of AE for LB ADL    Extremity/Trunk Assessment Upper Extremity Assessment Upper Extremity Assessment: RUE deficits/detail;LUE deficits/detail RUE Deficits / Details: sensory motor impairment affected fine motor coordination however functional; does bes when using visual feedback to improve movement patters RUE Coordination: decreased fine motor;decreased gross motor LUE Deficits / Details: similar to R   Lower Extremity Assessment Lower Extremity Assessment: Defer to PT evaluation (greater weakness proximally)        Vision   Vision Assessment?: No apparent visual deficits  Perception     Praxis      Cognition Arousal/Alertness: Awake/alert Behavior During Therapy: WFL for tasks assessed/performed (tearful at times) Overall Cognitive Status: Within Functional Limits for tasks assessed                                          Exercises  Exercises: Other exercises Other Exercises Other Exercises: "windshield wiper" exercises in supine - x 10 - hip and core strengthening Other Exercises: rythmic stabilization B shoulders - son present for education    Shoulder Instructions       General Comments      Pertinent Vitals/ Pain       Pain Assessment Pain Assessment: Faces Pain Location: B knees with end range flexion Pain Descriptors / Indicators: Grimacing Pain Intervention(s): Limited activity within patient's tolerance  Home Living                                          Prior Functioning/Environment              Frequency  Min 2X/week        Progress Toward Goals  OT Goals(current goals can now be found in the care plan section)  Progress towards OT goals: Progressing toward goals  Acute Rehab OT Goals Patient Stated Goal: to walk again OT Goal Formulation: With patient Time For Goal Achievement: 10/08/22 Potential to Achieve Goals: Good ADL Goals Pt Will Perform Lower Body Bathing: with supervision;sit to/from stand Pt Will Perform Lower Body Dressing: with supervision;sit to/from stand Pt Will Transfer to Toilet: with supervision;ambulating Pt Will Perform Toileting - Clothing Manipulation and hygiene: with supervision;sit to/from stand;sitting/lateral leans Pt/caregiver will Perform Home Exercise Program: Increased strength;Both right and left upper extremity;With written HEP provided;Independently  Plan Discharge plan remains appropriate    Co-evaluation                 AM-PAC OT "6 Clicks" Daily Activity     Outcome Measure   Help from another person eating meals?: A Little Help from another person taking care of personal grooming?: A Little Help from another person toileting, which includes using toliet, bedpan, or urinal?: Total Help from another person bathing (including washing, rinsing, drying)?: A Lot Help from another person to put on and taking off  regular upper body clothing?: A Little Help from another person to put on and taking off regular lower body clothing?: A Lot 6 Click Score: 14    End of Session    OT Visit Diagnosis: Other abnormalities of gait and mobility (R26.89);Muscle weakness (generalized) (M62.81)   Activity Tolerance Patient tolerated treatment well   Patient Left in bed;with call bell/phone within reach;with bed alarm set;with family/visitor present   Nurse Communication Mobility status        Time: 2025-4270 OT Time Calculation (min): 29 min  Charges: OT General Charges $OT Visit: 1 Visit OT Treatments $Self Care/Home Management : 23-37 mins  Maurie Boettcher, OT/L   Acute OT Clinical Specialist [place] Pager [phone] Office [phone]   [facility] 09/27/2022, 1:21 PM

## 2022-09-27 NOTE — Progress Notes (Signed)
NIF/VC performed.  Patient effort fair.  NIF -40 VC 0.75l

## 2022-09-27 NOTE — Progress Notes (Signed)
PROGRESS NOTE    Dana [name]  ZTI:[id number] DOB: 1966/08/06 DOA: 09/23/2022 PCP: [name], MD   Brief Narrative: 57 year old with past medical history significant for B12 deficiency on B12 shots, anxiety, back pain, right breast mass, chronic opioid use, DJD, fibromyalgia, headache, hypercholesterolemia, mild hypertension, obesity and tobacco use who presented to the ED on Monday afternoon for evaluation of progressive limb weakness since December 8, followed by acute worsening on Thursday, weakness is ascending and involve legs and arms.  Neurology has been consulted and concern for GB syndrome.  Failed attempt for LP by IR 1/9, underwent LP 1/10.    Assessment & Plan:   Principal Problem:   Debility Active Problems:   Obesity   Low back pain with sciatica   HTN (hypertension)   B12 deficiency   Hypokalemia   UTI (urinary tract infection)   Hypomagnesemia   1-Ascending progressive weakness Neurology following concern for Ethelene Hal syndrome NIF -40 Underwent LP 1/10: CSF fluid: WBC 3, RBC 17,750, total protein 155, glucose 70. Started on IV Immune- globuline. 1/10 for 5 days. Day 3/5 Some improvement on LE weakness.   2-Essential hypertension: On as needed hydralazine.   Obesity: Needs Lifestyle modification   Hypokalemia: Replaced.    UTI: Urine retention, could be related to neurology process. Foley catheter placed 1/10 Continue Rocephin  Urine culture:  Growing E. coli and Klebsiella pneumoniae   B12 deficiency: Gets B12 shots as outpatient.   Hypomagnesemia: Replaced.  Right arm heaviness; after magnesium infusion. Monitor.   Estimated body mass index is 48.59 kg/m as calculated from the following:   Height as of this encounter: '5\' 4"'$  (1.626 m).   Weight as of this encounter: 128.4 kg.   DVT prophylaxis: SCD holding blood thinner for LP Code Status: Full code Family Communication: care discussed with patient.  Disposition Plan:  Status is:  Inpatient Remains inpatient appropriate because: management of weakness, she will need CIR    Consultants:  Neurology   Procedures:    Antimicrobials:    Subjective: Report right arm heaviness, weird feeling, shoulder pain after magnesium infusion.  She has been able to move Lower extremities better.   Objective: Vitals:   09/26/22 2349 09/27/22 0549 09/27/22 0839 09/27/22 1007  BP: (!) 186/102 (!) 155/90 (!) 174/103 (!) 157/96  Pulse: 96 97 81 90  Resp: '18 18 19   '$ Temp: 98 F (36.7 C) 98.4 F (36.9 C) 98.2 F (36.8 C)   TempSrc: Oral Oral Oral   SpO2: 98% 99% 99%   Weight:      Height:        Intake/Output Summary (Last 24 hours) at 09/27/2022 1518 Last data filed at 09/27/2022 8250 Gross per 24 hour  Intake 420 ml  Output 2900 ml  Net -2480 ml    Filed Weights   09/25/22 1800 09/25/22 1952 09/26/22 2103  Weight: 126.3 kg 126.9 kg 128.4 kg    Examination:  General exam: NAD Respiratory system: CTA Cardiovascular system: S 1, S 2 RRR Gastrointestinal system: BS present, soft, nt Central nervous system: Alert and oriented. BL LE and upper extremities weakness, weakness worse LE>  Extremities: No edema  Data Reviewed: I have personally reviewed following labs and imaging studies  CBC: Recent Labs  Lab 09/23/22 1331 09/24/22 0410 09/24/22 1001 09/27/22 0628  WBC 10.9* 10.1 8.1 5.3  NEUTROABS 7.9*  --  5.8  --   HGB 13.1 11.5* 11.9* 11.1*  HCT 41.5 35.8* 37.7 34.2*  MCV 93.7 93.2 95.2 92.4  PLT 395 342 324 716    Basic Metabolic Panel: Recent Labs  Lab 09/23/22 1331 09/23/22 2318 09/24/22 1001 09/27/22 0628  NA 139 139 138 136  K 3.1* 3.1* 3.5 4.1  CL 98 101 104 102  CO2 '25 26 25 24  '$ GLUCOSE 115* 99 125* 103*  BUN '10 7 6 7  '$ CREATININE 0.86 0.87 0.73 0.76  CALCIUM 8.7* 8.4* 8.1* 8.3*  MG  --  1.5* 1.9 1.7  PHOS  --  3.6  --   --     GFR: Estimated Creatinine Clearance: 103.1 mL/min (by C-G formula based on SCr of 0.76  mg/dL). Liver Function Tests: Recent Labs  Lab 09/23/22 1331  AST 33  ALT 19  ALKPHOS 79  BILITOT 0.9  PROT 6.8  ALBUMIN 3.1*    No results for input(s): "LIPASE", "AMYLASE" in the last 168 hours. No results for input(s): "AMMONIA" in the last 168 hours. Coagulation Profile: Recent Labs  Lab 09/23/22 2318  INR 1.0    Cardiac Enzymes: No results for input(s): "CKTOTAL", "CKMB", "CKMBINDEX", "TROPONINI" in the last 168 hours. BNP (last 3 results) No results for input(s): "PROBNP" in the last 8760 hours. HbA1C: No results for input(s): "HGBA1C" in the last 72 hours. CBG: No results for input(s): "GLUCAP" in the last 168 hours. Lipid Profile: No results for input(s): "CHOL", "HDL", "LDLCALC", "TRIG", "CHOLHDL", "LDLDIRECT" in the last 72 hours. Thyroid Function Tests: No results for input(s): "TSH", "T4TOTAL", "FREET4", "T3FREE", "THYROIDAB" in the last 72 hours.  Anemia Panel: No results for input(s): "VITAMINB12", "FOLATE", "FERRITIN", "TIBC", "IRON", "RETICCTPCT" in the last 72 hours.  Sepsis Labs: Recent Labs  Lab 09/23/22 2318  LATICACIDVEN 1.2     Recent Results (from the past 240 hour(s))  Resp panel by RT-PCR (RSV, Flu A&B, Covid) Anterior Nasal Swab     Status: None   Collection Time: 09/23/22  1:31 PM   Specimen: Anterior Nasal Swab  Result Value Ref Range Status   SARS Coronavirus 2 by RT PCR NEGATIVE NEGATIVE Final    Comment: (NOTE) SARS-CoV-2 target nucleic acids are NOT DETECTED.  The SARS-CoV-2 RNA is generally detectable in upper respiratory specimens during the acute phase of infection. The lowest concentration of SARS-CoV-2 viral copies this assay can detect is 138 copies/mL. A negative result does not preclude SARS-Cov-2 infection and should not be used as the sole basis for treatment or other patient management decisions. A negative result may occur with  improper specimen collection/handling, submission of specimen other than  nasopharyngeal swab, presence of viral mutation(s) within the areas targeted by this assay, and inadequate number of viral copies(<138 copies/mL). A negative result must be combined with clinical observations, patient history, and epidemiological information. The expected result is Negative.  Fact Sheet for Patients:  EntrepreneurPulse.com.au  Fact Sheet for Healthcare Providers:  IncredibleEmployment.be  This test is no t yet approved or cleared by the [place] FDA and  has been authorized for detection and/or diagnosis of SARS-CoV-2 by FDA under an Emergency Use Authorization (EUA). This EUA will remain  in effect (meaning this test can be used) for the duration of the COVID-19 declaration under Section 564(b)(1) of the Act, 21 U.S.C.section 360bbb-3(b)(1), unless the authorization is terminated  or revoked sooner.       Influenza A by PCR NEGATIVE NEGATIVE Final   Influenza B by PCR NEGATIVE NEGATIVE Final    Comment: (NOTE) The Xpert Xpress SARS-CoV-2/FLU/RSV plus assay is intended  as an aid in the diagnosis of influenza from Nasopharyngeal swab specimens and should not be used as a sole basis for treatment. Nasal washings and aspirates are unacceptable for Xpert Xpress SARS-CoV-2/FLU/RSV testing.  Fact Sheet for Patients: EntrepreneurPulse.com.au  Fact Sheet for Healthcare Providers: IncredibleEmployment.be  This test is not yet approved or cleared by the [place] FDA and has been authorized for detection and/or diagnosis of SARS-CoV-2 by FDA under an Emergency Use Authorization (EUA). This EUA will remain in effect (meaning this test can be used) for the duration of the COVID-19 declaration under Section 564(b)(1) of the Act, 21 U.S.C. section 360bbb-3(b)(1), unless the authorization is terminated or revoked.     Resp Syncytial Virus by PCR NEGATIVE NEGATIVE Final    Comment:  (NOTE) Fact Sheet for Patients: EntrepreneurPulse.com.au  Fact Sheet for Healthcare Providers: IncredibleEmployment.be  This test is not yet approved or cleared by the [place] FDA and has been authorized for detection and/or diagnosis of SARS-CoV-2 by FDA under an Emergency Use Authorization (EUA). This EUA will remain in effect (meaning this test can be used) for the duration of the COVID-19 declaration under Section 564(b)(1) of the Act, 21 U.S.C. section 360bbb-3(b)(1), unless the authorization is terminated or revoked.  Performed at [facility], [place] [address], [place], [place] [address]   Urine Culture     Status: Abnormal   Collection Time: 09/24/22  1:39 AM   Specimen: Urine, Clean Catch  Result Value Ref Range Status   Specimen Description URINE, CLEAN CATCH  Final   Special Requests   Final    NONE Performed at [facility], [place] [address] [place], [place] [address]    Culture (A)  Final    >=100,000 COLONIES/mL ESCHERICHIA COLI 80,000 COLONIES/mL KLEBSIELLA PNEUMONIAE    Report Status 09/26/2022 FINAL  Final   Organism ID, Bacteria ESCHERICHIA COLI (A)  Final   Organism ID, Bacteria KLEBSIELLA PNEUMONIAE (A)  Final      Susceptibility   Escherichia coli - MIC*    AMPICILLIN <=2 SENSITIVE Sensitive     CEFAZOLIN <=4 SENSITIVE Sensitive     CEFEPIME <=0.12 SENSITIVE Sensitive     CEFTRIAXONE <=0.25 SENSITIVE Sensitive     CIPROFLOXACIN <=0.25 SENSITIVE Sensitive     GENTAMICIN <=1 SENSITIVE Sensitive     IMIPENEM <=0.25 SENSITIVE Sensitive     NITROFURANTOIN <=16 SENSITIVE Sensitive     TRIMETH/SULFA <=20 SENSITIVE Sensitive     AMPICILLIN/SULBACTAM <=2 SENSITIVE Sensitive     PIP/TAZO <=4 SENSITIVE Sensitive     * >=100,000 COLONIES/mL ESCHERICHIA COLI   Klebsiella pneumoniae - MIC*    AMPICILLIN >=32 RESISTANT Resistant     CEFAZOLIN <=4 SENSITIVE Sensitive     CEFEPIME <=0.12 SENSITIVE  Sensitive     CEFTRIAXONE <=0.25 SENSITIVE Sensitive     CIPROFLOXACIN <=0.25 SENSITIVE Sensitive     GENTAMICIN <=1 SENSITIVE Sensitive     IMIPENEM <=0.25 SENSITIVE Sensitive     NITROFURANTOIN 32 SENSITIVE Sensitive     TRIMETH/SULFA <=20 SENSITIVE Sensitive     AMPICILLIN/SULBACTAM 4 SENSITIVE Sensitive     PIP/TAZO <=4 SENSITIVE Sensitive     * 80,000 COLONIES/mL KLEBSIELLA PNEUMONIAE         Radiology Studies: No results found.      Scheduled Meds:  folic acid  1 mg Oral Daily   gabapentin  300 mg Oral TID   latanoprost  1 drop Both Eyes QHS   lidocaine  1 patch Transdermal  Q24H   multivitamin with minerals  1 tablet Oral Daily   pantoprazole  40 mg Oral Daily   senna-docusate  1 tablet Oral BID   Continuous Infusions:  cefTRIAXone (ROCEPHIN)  IV Stopped (09/27/22 0017)   Immune Globulin 10%       LOS: 3 days    Time spent: 35 minutes    Lorynn Moeser A Karmine Kauer, MD [facility]   If 7PM-7AM, please contact night-coverage [email or web address]  09/27/2022, 3:18 PM

## 2022-09-27 NOTE — Progress Notes (Incomplete)
NEUROLOGY CONSULTATION PROGRESS NOTE   Date of service: September 27, 2022 Patient Name: Dana [name] MRN:  [id number] DOB:  1966-07-03  Subjective/Interval: Patient received 2/5 IVIG treatment yesterday with no complications. No acute events overnight. NIF/VC decreased this am, but stable.   Patient seen this morning with increased strength BLE. Patient has continued taking her home pain medications, stating that she has been on them for "over 10 years", due to "pain all over". Educated on side effects of opoid medications.  Patient reports increased anxiety about being in hospital and states that she "just wants to go home". Educated on duration of medication and current plan of care, patient in agreement.   Exam: Vitals:   09/27/22 0839 09/27/22 1007  BP: (!) 174/103 (!) 157/96  Pulse: 81 90  Resp: 19   Temp: 98.2 F (36.8 C)   SpO2: 99%    Gen: In bed, NAD Resp: non-labored breathing, no acute distress.  Abd: soft, nt  Neuro:  Mental Status: AAOx4. Good attention. Able to provide clear history.   CN:  PERRL, EOMI, no gaze preference or ptosis.  Facial movements and sensation intact and symmetric Hearing intact to voice, shoulder shrug bilateral, midline tongue protrusion  Motor:  Reduced grip BUE. No pronator drift seen.  Able to lift BLE against gravity and hold against resistance, higher off the bed.   Impression: 57 year old female with ascending weakness and numbness. LP results and assessment most consistent with Guillain-Barr syndrome. Albuminocytologic dissociation on LP under fluoro.   Recommendations: Continue IVIG for 2 g/kg, daily divided over 5 days Continue NIFs/VIC Q12H PT/OT Out of bed to chair for all meals Day/night cycle   Neurology will continue to follow  Pt seen by Neuro NP/APP and later by MD. Note/plan to be edited by MD as needed.    Otelia Santee, DNP, AGACNP-BC [facility] Please use AMION for pager and EPIC for  messaging  I have seen the patient reviewed the above note, she has some subjective improvement, continue IVIG.  Roland Rack, MD [facility] [phone]  If 7pm- 7am, please page neurology on call as listed in [place].

## 2022-09-27 NOTE — Progress Notes (Signed)
Occupational Therapy Treatment Note HEP began for pt to work with over the weekend to continue to strengthen to increase independence with ADL and mobility.     09/27/22 1600  OT Visit Information  Last OT Received On 09/27/22  Assistance Needed +2  History of Present Illness Pt is a 57 y/o female who presented with progressive weakness and sensation changes, consistent with Guillain-Barr syndrome. MRI negative. LP results and assessment most consistent with Guillain-Barr syndrome.  PMH includes anxiety, DJD, fibromyalgia, HTN, tobacco use.  Precautions  Precautions Fall  Pain Assessment  Pain Assessment No/denies pain  Exercises  Exercises General Upper Extremity;Hand exercises  General Exercises - Upper Extremity  Shoulder ABduction AROM;AAROM;Both;15 reps;Strengthening  Elbow Extension Strengthening;Both;15 reps;Seated;Theraband  Shoulder Flexion Both;AROM;AAROM;Strengthening;10 reps;Seated  Elbow Flexion Strengthening;Both;20 reps;Seated;Theraband  Theraband Level (Elbow Flexion) Level 1 (Yellow)  Theraband Level (Elbow Extension) Level 1 (Yellow)  Other Exercises  Other Exercises squeeze ball  Other Exercises level 1 theraputty  OT - End of Session  Activity Tolerance Patient tolerated treatment well  Patient left in chair;with call bell/phone within reach  Nurse Communication Mobility status  OT Assessment/Plan  OT Plan Discharge plan remains appropriate  OT Visit Diagnosis Other abnormalities of gait and mobility (R26.89);Muscle weakness (generalized) (M62.81)  OT Frequency (ACUTE ONLY) Min 2X/week  Recommendations for Other Services Rehab consult  Follow Up Recommendations Acute inpatient rehab (3hours/day)  Assistance recommended at discharge Frequent or constant Supervision/Assistance  Patient can return home with the following Two people to help with walking and/or transfers;A lot of help with bathing/dressing/bathroom;Assistance with cooking/housework;Assist for  transportation;Help with stairs or ramp for entrance  OT Equipment BSC/3in1;Wheelchair (measurements OT);Wheelchair cushion (measurements OT);Tub/shower bench  AM-PAC OT "6 Clicks" Daily Activity Outcome Measure (Version 2)  Help from another person eating meals? 3  Help from another person taking care of personal grooming? 3  Help from another person toileting, which includes using toliet, bedpan, or urinal? 1  Help from another person bathing (including washing, rinsing, drying)? 2  Help from another person to put on and taking off regular upper body clothing? 3  Help from another person to put on and taking off regular lower body clothing? 2  6 Click Score 14  Progressive Mobility  What is the highest level of mobility based on the progressive mobility assessment? Level 3 (Stands with assist) - Balance while standing  and cannot march in place  Mobility Referral Yes  Activity Repositioned in chair  OT Goal Progression  Progress towards OT goals Progressing toward goals  Acute Rehab OT Goals  Patient Stated Goal to get better  OT Goal Formulation With patient  Time For Goal Achievement 10/08/22  Potential to Achieve Goals Good  ADL Goals  Pt Will Perform Lower Body Bathing with supervision;sit to/from stand  Pt Will Perform Lower Body Dressing with supervision;sit to/from stand  Pt Will Transfer to Toilet with supervision;ambulating  Pt Will Perform Toileting - Clothing Manipulation and hygiene with supervision;sit to/from stand;sitting/lateral leans  Pt/caregiver will Perform Home Exercise Program Increased strength;Both right and left upper extremity;With written HEP provided;Independently  OT Time Calculation  OT Start Time (ACUTE ONLY) 1510  OT Stop Time (ACUTE ONLY) 1527  OT Time Calculation (min) 17 min  OT General Charges  $OT Visit 1 Visit  OT Treatments  $Therapeutic Exercise 8-22 mins   Maurie Boettcher, OT/L   Acute OT Clinical Specialist [place] Pager [phone] Office [phone]

## 2022-09-27 NOTE — Progress Notes (Signed)
Patient performed NIF and VC with good effort  NIF -40  VC 1.5L

## 2022-09-27 NOTE — Progress Notes (Signed)
Inpatient Rehabilitation Admissions Coordinator   I will begin Auth with [facility] for possible CIR admit once IVIG treatment completed.  Danne Baxter, RN, MSN Rehab Admissions Coordinator [phone] 09/27/2022 1:31 PM

## 2022-09-28 DIAGNOSIS — R5381 Other malaise: Secondary | ICD-10-CM | POA: Diagnosis not present

## 2022-09-28 DIAGNOSIS — G610 Guillain-Barre syndrome: Secondary | ICD-10-CM | POA: Diagnosis not present

## 2022-09-28 MED ORDER — GABAPENTIN 300 MG PO CAPS
600.0000 mg | ORAL_CAPSULE | Freq: Three times a day (TID) | ORAL | Status: DC
  Administered 2022-09-28 – 2022-10-02 (×12): 600 mg via ORAL
  Filled 2022-09-28 (×12): qty 2

## 2022-09-28 MED ORDER — VITAMIN B-12 100 MCG PO TABS
100.0000 ug | ORAL_TABLET | Freq: Every day | ORAL | Status: DC
  Administered 2022-09-28 – 2022-10-02 (×5): 100 ug via ORAL
  Filled 2022-09-28 (×5): qty 1

## 2022-09-28 NOTE — Progress Notes (Signed)
PROGRESS NOTE    Dana [name]  OEV:[id number] DOB: [birth year] DOA: 09/23/2022 PCP: [name], MD   Brief Narrative: 57 year old with past medical history significant for B12 deficiency on B12 shots, anxiety, back pain, right breast mass, chronic opioid use, DJD, fibromyalgia, headache, hypercholesterolemia, mild hypertension, obesity and tobacco use who presented to the ED on Monday afternoon for evaluation of progressive limb weakness since December 8, followed by acute worsening on Thursday, weakness is ascending and involve legs and arms.  Neurology has been consulted and concern for GB syndrome.  Failed attempt for LP by IR 1/9, underwent LP 1/10.    Assessment & Plan:   Principal Problem:   Debility Active Problems:   Obesity   Low back pain with sciatica   HTN (hypertension)   B12 deficiency   Hypokalemia   UTI (urinary tract infection)   Hypomagnesemia   1-Ascending progressive weakness Neurology following concern for Ethelene Hal syndrome NIF -40 Underwent LP 1/10: CSF fluid: WBC 3, RBC 17,750, total protein 155, glucose 70. Started on IV Immune- globuline. 1/10 for 5 days. Day 4/5 Some improvement on LE weakness.  Hopefully CIR on Monday if insurance approved.   2-Essential hypertension: On as needed hydralazine.   Obesity: Needs Lifestyle modification   Hypokalemia: Replaced.    UTI: Urine retention, could be related to neurology process. Foley catheter placed 1/10 Continue Rocephin  Urine culture:  Growing E. coli and Klebsiella pneumoniae Voiding trial.   B12 deficiency: Gets B12 shots as outpatient. Start oral.    Hypomagnesemia: Replaced.  Right arm heaviness; after magnesium infusion. Monitor. resolved  Estimated body mass index is 48.59 kg/m as calculated from the following:   Height as of this encounter: '5\' 4"'$  (1.626 m).   Weight as of this encounter: 128.4 kg.   DVT prophylaxis: SCD holding blood thinner for LP Code Status: Full  code Family Communication: care discussed with patient.  Disposition Plan:  Status is: Inpatient Remains inpatient appropriate because: management of weakness, she will need CIR    Consultants:  Neurology   Procedures:    Antimicrobials:    Subjective: Report tingling hands and feet. She is able to move better lower extremities.  Objective: Vitals:   09/28/22 0004 09/28/22 0111 09/28/22 0514 09/28/22 0939  BP: (!) 149/82 118/81 (!) 156/85 (!) 159/101  Pulse: (!) 103 (!) 103 91 93  Resp: '16 16 18 17  '$ Temp: 99.1 F (37.3 C) 98.3 F (36.8 C) 98.7 F (37.1 C) 98.3 F (36.8 C)  TempSrc: Oral Oral Oral   SpO2: 97% 93% 99% 99%  Weight:      Height:        Intake/Output Summary (Last 24 hours) at 09/28/2022 1423 Last data filed at 09/28/2022 1200 Gross per 24 hour  Intake 880 ml  Output 2500 ml  Net -1620 ml    Filed Weights   09/25/22 1800 09/25/22 1952 09/26/22 2103  Weight: 126.3 kg 126.9 kg 128.4 kg    Examination:  General exam: NAD Respiratory system: CTA Cardiovascular system: S 1, S 2 RRR Gastrointestinal system: BS present, soft, nt Central nervous system: Alert and oriented. BL LE and upper extremities weakness, weakness worse LE>  Extremities: No edema  Data Reviewed: I have personally reviewed following labs and imaging studies  CBC: Recent Labs  Lab 09/23/22 1331 09/24/22 0410 09/24/22 1001 09/27/22 0628  WBC 10.9* 10.1 8.1 5.3  NEUTROABS 7.9*  --  5.8  --   HGB 13.1 11.5* 11.9*  11.1*  HCT 41.5 35.8* 37.7 34.2*  MCV 93.7 93.2 95.2 92.4  PLT 395 342 324 518    Basic Metabolic Panel: Recent Labs  Lab 09/23/22 1331 09/23/22 2318 09/24/22 1001 09/27/22 0628  NA 139 139 138 136  K 3.1* 3.1* 3.5 4.1  CL 98 101 104 102  CO2 '25 26 25 24  '$ GLUCOSE 115* 99 125* 103*  BUN '10 7 6 7  '$ CREATININE 0.86 0.87 0.73 0.76  CALCIUM 8.7* 8.4* 8.1* 8.3*  MG  --  1.5* 1.9 1.7  PHOS  --  3.6  --   --     GFR: Estimated Creatinine Clearance: 103.1  mL/min (by C-G formula based on SCr of 0.76 mg/dL). Liver Function Tests: Recent Labs  Lab 09/23/22 1331  AST 33  ALT 19  ALKPHOS 79  BILITOT 0.9  PROT 6.8  ALBUMIN 3.1*    No results for input(s): "LIPASE", "AMYLASE" in the last 168 hours. No results for input(s): "AMMONIA" in the last 168 hours. Coagulation Profile: Recent Labs  Lab 09/23/22 2318  INR 1.0    Cardiac Enzymes: No results for input(s): "CKTOTAL", "CKMB", "CKMBINDEX", "TROPONINI" in the last 168 hours. BNP (last 3 results) No results for input(s): "PROBNP" in the last 8760 hours. HbA1C: No results for input(s): "HGBA1C" in the last 72 hours. CBG: No results for input(s): "GLUCAP" in the last 168 hours. Lipid Profile: No results for input(s): "CHOL", "HDL", "LDLCALC", "TRIG", "CHOLHDL", "LDLDIRECT" in the last 72 hours. Thyroid Function Tests: No results for input(s): "TSH", "T4TOTAL", "FREET4", "T3FREE", "THYROIDAB" in the last 72 hours.  Anemia Panel: No results for input(s): "VITAMINB12", "FOLATE", "FERRITIN", "TIBC", "IRON", "RETICCTPCT" in the last 72 hours.  Sepsis Labs: Recent Labs  Lab 09/23/22 2318  LATICACIDVEN 1.2     Recent Results (from the past 240 hour(s))  Resp panel by RT-PCR (RSV, Flu A&B, Covid) Anterior Nasal Swab     Status: None   Collection Time: 09/23/22  1:31 PM   Specimen: Anterior Nasal Swab  Result Value Ref Range Status   SARS Coronavirus 2 by RT PCR NEGATIVE NEGATIVE Final    Comment: (NOTE) SARS-CoV-2 target nucleic acids are NOT DETECTED.  The SARS-CoV-2 RNA is generally detectable in upper respiratory specimens during the acute phase of infection. The lowest concentration of SARS-CoV-2 viral copies this assay can detect is 138 copies/mL. A negative result does not preclude SARS-Cov-2 infection and should not be used as the sole basis for treatment or other patient management decisions. A negative result may occur with  improper specimen collection/handling,  submission of specimen other than nasopharyngeal swab, presence of viral mutation(s) within the areas targeted by this assay, and inadequate number of viral copies(<138 copies/mL). A negative result must be combined with clinical observations, patient history, and epidemiological information. The expected result is Negative.  Fact Sheet for Patients:  EntrepreneurPulse.com.au  Fact Sheet for Healthcare Providers:  IncredibleEmployment.be  This test is no t yet approved or cleared by the [place] FDA and  has been authorized for detection and/or diagnosis of SARS-CoV-2 by FDA under an Emergency Use Authorization (EUA). This EUA will remain  in effect (meaning this test can be used) for the duration of the COVID-19 declaration under Section 564(b)(1) of the Act, 21 U.S.C.section 360bbb-3(b)(1), unless the authorization is terminated  or revoked sooner.       Influenza A by PCR NEGATIVE NEGATIVE Final   Influenza B by PCR NEGATIVE NEGATIVE Final    Comment: (NOTE)  The Xpert Xpress SARS-CoV-2/FLU/RSV plus assay is intended as an aid in the diagnosis of influenza from Nasopharyngeal swab specimens and should not be used as a sole basis for treatment. Nasal washings and aspirates are unacceptable for Xpert Xpress SARS-CoV-2/FLU/RSV testing.  Fact Sheet for Patients: EntrepreneurPulse.com.au  Fact Sheet for Healthcare Providers: IncredibleEmployment.be  This test is not yet approved or cleared by the [place] FDA and has been authorized for detection and/or diagnosis of SARS-CoV-2 by FDA under an Emergency Use Authorization (EUA). This EUA will remain in effect (meaning this test can be used) for the duration of the COVID-19 declaration under Section 564(b)(1) of the Act, 21 U.S.C. section 360bbb-3(b)(1), unless the authorization is terminated or revoked.     Resp Syncytial Virus by PCR NEGATIVE  NEGATIVE Final    Comment: (NOTE) Fact Sheet for Patients: EntrepreneurPulse.com.au  Fact Sheet for Healthcare Providers: IncredibleEmployment.be  This test is not yet approved or cleared by the [place] FDA and has been authorized for detection and/or diagnosis of SARS-CoV-2 by FDA under an Emergency Use Authorization (EUA). This EUA will remain in effect (meaning this test can be used) for the duration of the COVID-19 declaration under Section 564(b)(1) of the Act, 21 U.S.C. section 360bbb-3(b)(1), unless the authorization is terminated or revoked.  Performed at [facility], [place] [address] [place], [place] [address]   Urine Culture     Status: Abnormal   Collection Time: 09/24/22  1:39 AM   Specimen: Urine, Clean Catch  Result Value Ref Range Status   Specimen Description URINE, CLEAN CATCH  Final   Special Requests   Final    NONE Performed at [facility], [place] [address] [place], [place] [address]    Culture (A)  Final    >=100,000 COLONIES/mL ESCHERICHIA COLI 80,000 COLONIES/mL KLEBSIELLA PNEUMONIAE    Report Status 09/26/2022 FINAL  Final   Organism ID, Bacteria ESCHERICHIA COLI (A)  Final   Organism ID, Bacteria KLEBSIELLA PNEUMONIAE (A)  Final      Susceptibility   Escherichia coli - MIC*    AMPICILLIN <=2 SENSITIVE Sensitive     CEFAZOLIN <=4 SENSITIVE Sensitive     CEFEPIME <=0.12 SENSITIVE Sensitive     CEFTRIAXONE <=0.25 SENSITIVE Sensitive     CIPROFLOXACIN <=0.25 SENSITIVE Sensitive     GENTAMICIN <=1 SENSITIVE Sensitive     IMIPENEM <=0.25 SENSITIVE Sensitive     NITROFURANTOIN <=16 SENSITIVE Sensitive     TRIMETH/SULFA <=20 SENSITIVE Sensitive     AMPICILLIN/SULBACTAM <=2 SENSITIVE Sensitive     PIP/TAZO <=4 SENSITIVE Sensitive     * >=100,000 COLONIES/mL ESCHERICHIA COLI   Klebsiella pneumoniae - MIC*    AMPICILLIN >=32 RESISTANT Resistant     CEFAZOLIN <=4 SENSITIVE Sensitive      CEFEPIME <=0.12 SENSITIVE Sensitive     CEFTRIAXONE <=0.25 SENSITIVE Sensitive     CIPROFLOXACIN <=0.25 SENSITIVE Sensitive     GENTAMICIN <=1 SENSITIVE Sensitive     IMIPENEM <=0.25 SENSITIVE Sensitive     NITROFURANTOIN 32 SENSITIVE Sensitive     TRIMETH/SULFA <=20 SENSITIVE Sensitive     AMPICILLIN/SULBACTAM 4 SENSITIVE Sensitive     PIP/TAZO <=4 SENSITIVE Sensitive     * 80,000 COLONIES/mL KLEBSIELLA PNEUMONIAE         Radiology Studies: No results found.      Scheduled Meds:  Chlorhexidine Gluconate Cloth  6 each Topical Daily   folic acid  1 mg Oral Daily   gabapentin  600 mg  Oral TID   latanoprost  1 drop Both Eyes QHS   lidocaine  1 patch Transdermal Q24H   multivitamin with minerals  1 tablet Oral Daily   pantoprazole  40 mg Oral Daily   senna-docusate  1 tablet Oral BID   vitamin B-12  100 mcg Oral Daily   Continuous Infusions:  cefTRIAXone (ROCEPHIN)  IV 1 g (09/28/22 0038)   Immune Globulin 10%       LOS: 4 days    Time spent: 35 minutes    Katrena Stehlin A Kelby Lotspeich, MD [facility]   If 7PM-7AM, please contact night-coverage [email or web address]  09/28/2022, 2:23 PM

## 2022-09-28 NOTE — Progress Notes (Signed)
Occupational Therapy Treatment Patient Details Name: Dana [name] MRN: [id number] DOB: [birth year] Today's Date: 09/28/2022   History of present illness Pt is a 57 y/o female who presented with progressive weakness and sensation changes, consistent with Guillain-Barr syndrome. MRI negative. LP results and assessment most consistent with Guillain-Barr syndrome.  PMH includes anxiety, DJD, fibromyalgia, HTN, tobacco use.   OT comments  Pt. Seen for skilled OT treatment session. Pt. Able to recall and demonstrate good technique with all UB HEP that previous OT had taught her.  Good recall also of squeeze ball and theraputty use.  Reviewed need for completion several times a day.  Also encouraged bed mobility/eob with staff and family assistance over the weekend to continue with increasing strength and endurance in preparation fo AIR level therapies.  Pt. Remains eager and motivated to get better. Great candidate for AIR.     Recommendations for follow up therapy are one component of a multi-disciplinary discharge planning process, led by the attending physician.  Recommendations may be updated based on patient status, additional functional criteria and insurance authorization.    Follow Up Recommendations  Acute inpatient rehab (3hours/day)     Assistance Recommended at Discharge Frequent or constant Supervision/Assistance  Patient can return home with the following  Two people to help with walking and/or transfers;A lot of help with bathing/dressing/bathroom;Assistance with cooking/housework;Assist for transportation;Help with stairs or ramp for entrance   Equipment Recommendations  BSC/3in1;Wheelchair (measurements OT);Wheelchair cushion (measurements OT);Tub/shower bench    Recommendations for Other Services Rehab consult    Precautions / Restrictions Precautions Precautions: Fall Restrictions Weight Bearing Restrictions: No       Mobility Bed Mobility                     Transfers                         Balance                                           ADL either performed or assessed with clinical judgement   ADL                                              Extremity/Trunk Assessment              Vision       Perception     Praxis      Cognition Arousal/Alertness: Awake/alert Behavior During Therapy: WFL for tasks assessed/performed Overall Cognitive Status: Within Functional Limits for tasks assessed                                          Exercises General Exercises - Upper Extremity Shoulder Flexion: Both, AROM, AAROM, Strengthening, 10 reps, Seated Shoulder ABduction: AROM, AAROM, Both, 15 reps, Strengthening Elbow Flexion: Strengthening, Both, 20 reps, Seated, Theraband Theraband Level (Elbow Flexion): Level 1 (Yellow) Elbow Extension: Strengthening, Both, 15 reps, Seated, Theraband Theraband Level (Elbow Extension): Level 1 (Yellow) Other Exercises Other Exercises: squeeze ball Other Exercises: level 1 theraputty    Shoulder Instructions       General Comments  Pertinent Vitals/ Pain       Pain Assessment Pain Assessment: Faces Faces Pain Scale: Hurts a little bit Pain Location: B legs Pain Descriptors / Indicators: Aching Pain Intervention(s): Limited activity within patient's tolerance, Monitored during session, Premedicated before session  Home Living                                          Prior Functioning/Environment              Frequency  Min 2X/week        Progress Toward Goals  OT Goals(current goals can now be found in the care plan section)  Progress towards OT goals: Progressing toward goals     Plan Discharge plan remains appropriate    Co-evaluation                 AM-PAC OT "6 Clicks" Daily Activity     Outcome Measure   Help from another person eating meals?: A Little Help  from another person taking care of personal grooming?: A Little Help from another person toileting, which includes using toliet, bedpan, or urinal?: Total Help from another person bathing (including washing, rinsing, drying)?: A Lot Help from another person to put on and taking off regular upper body clothing?: A Little Help from another person to put on and taking off regular lower body clothing?: A Lot 6 Click Score: 14    End of Session    OT Visit Diagnosis: Other abnormalities of gait and mobility (R26.89);Muscle weakness (generalized) (M62.81)   Activity Tolerance Patient tolerated treatment well   Patient Left in bed;with call bell/phone within reach   Nurse Communication          Time: 8675-4492 OT Time Calculation (min): 20 min  Charges: OT General Charges $OT Visit: 1 Visit OT Treatments $Therapeutic Exercise: 8-22 mins  Dana [name], COTA/L Acute Rehabilitation [phone]   Dana [name] 09/28/2022, 11:48 AM

## 2022-09-28 NOTE — Progress Notes (Signed)
NEUROLOGY CONSULTATION PROGRESS NOTE   Date of service: September 28, 2022 Patient Name: Dana [name] MRN:  [id number] DOB:  09/06/1966    Interval Hx   Patient received 3/5 IVIG treatment last night with no complications. No acute events overnight.   Patient seen this morning with increased strength BLE, improved effort on NiF/VC testing. Continues to c/o burning/tingling in hands and feet. States that the gabapentin helps with this some.  Reviewed the duration of IVIG and complete plan of care with patient.   Vitals   Vitals:   09/28/22 0004 09/28/22 0111 09/28/22 0514 09/28/22 0939  BP: (!) 149/82 118/81 (!) 156/85 (!) 159/101  Pulse: (!) 103 (!) 103 91 93  Resp: '16 16 18 17  '$ Temp: 99.1 F (37.3 C) 98.3 F (36.8 C) 98.7 F (37.1 C) 98.3 F (36.8 C)  TempSrc: Oral Oral Oral   SpO2: 97% 93% 99% 99%  Weight:      Height:         Body mass index is 48.59 kg/m.  Physical Exam   General: Laying comfortably in bed; in no acute distress.  HENT: Normal oropharynx and mucosa. Normal external appearance of ears and nose.  Neck: Supple, no pain or tenderness  CV: No JVD. No peripheral edema.  Pulmonary: Symmetric Chest rise. Normal respiratory effort.  Abdomen: Soft to touch, non-tender.  Ext: No cyanosis, edema, or deformity  Skin: No rash. Normal palpation of skin.   Musculoskeletal: Normal digits and nails by inspection.  Neurologic Examination   Mental Status: AAOx4. Good attention. Able to provide clear history.    CN:  PERRL, EOMI, no gaze preference or ptosis.  Facial movements and sensation intact and symmetric Hearing intact to voice, shoulder shrug bilateral, midline tongue protrusion   Motor:  Reduced grip BUE. No pronator drift seen.  Able to lift BLE against gravity and hold against resistance, improved strength bilaterally  Sensation: Decreased proprioception in bl hands, pseudoathetosis present.  Decreased sensation left thigh area Burning and  tingling to bilateral hands and feet.   Coordination/Complex Motor:  - Finger to Nose: intact bilaterally    Labs   Basic Metabolic Panel:  Lab Results  Component Value Date   NA 136 09/27/2022   K 4.1 09/27/2022   CO2 24 09/27/2022   GLUCOSE 103 (H) 09/27/2022   BUN 7 09/27/2022   CREATININE 0.76 09/27/2022   CALCIUM 8.3 (L) 09/27/2022   GFRNONAA >60 09/27/2022   GFRAA >60 05/04/2019   HbA1c:  Lab Results  Component Value Date   HGBA1C 6.1 09/05/2022   LDL:  Lab Results  Component Value Date   LDLCALC 135 (H) 09/05/2022   Urine Drug Screen: No results found for: "LABOPIA", "COCAINSCRNUR", "LABBENZ", "AMPHETMU", "THCU", "LABBARB"  Alcohol Level No results found for: "ETH" No results found for: "PHENYTOIN", "ZONISAMIDE", "LAMOTRIGINE", "LEVETIRACETA" No results found for: "PHENYTOIN", "PHENOBARB", "VALPROATE", "CBMZ"  Imaging and Diagnostic studies  Results for orders placed during the hospital encounter of 09/23/22  CT Head Wo Contrast  Narrative CLINICAL DATA:  Headache  EXAM: CT HEAD WITHOUT CONTRAST  TECHNIQUE: Contiguous axial images were obtained from the base of the skull through the vertex without intravenous contrast.  RADIATION DOSE REDUCTION: This exam was performed according to the departmental dose-optimization program which includes automated exposure control, adjustment of the mA and/or kV according to patient size and/or use of iterative reconstruction technique.  COMPARISON:  None Available.  FINDINGS: Brain: No evidence of acute infarction, hemorrhage, hydrocephalus, extra-axial collection  or mass lesion/mass effect. Partially empty sella.  Vascular: No hyperdense vessel or unexpected calcification.  Skull: Normal. Negative for fracture or focal lesion.  Sinuses/Orbits: No acute finding.  Other: None.  IMPRESSION: No specific etiology for headaches identified.  Electronically Signed By: Marin Roberts M.D. On: 09/23/2022  17:01  Impression   57 year old female with ascending weakness and numbness. LP results and assessment most consistent with Guillain-Barr syndrome. Treatment with 5 days IVIG.    Recommendations  - Will increase gabapentin to '600mg'$  TID - Continue IVIG for 2 g/kg, daily divided over 5 days today is day four - Continue NIFs/VIC Q12H - PT/OT - Out of bed to chair for all meals - Day/night cycle   Neurology will continue to follow ______________________________________________________________________   Otelia Santee, DNP, AGACNP-BC [facility] Please use AMION for pager and EPIC for messaging  I have seen the patient reviewed the above note.  She continues to subjectively improve, though continues to have paresthesias.  Given the bothersome paresthesias, we could increase gabapentin as tolerated.  Roland Rack, MD [facility] [phone]  If 7pm- 7am, please page neurology on call as listed in [place].

## 2022-09-28 NOTE — Progress Notes (Signed)
NIF/VC as follows: NIF -40 VC 1.9L  Great effort.

## 2022-09-28 NOTE — Progress Notes (Signed)
NIF/VC Nif -40 VC 1.5L

## 2022-09-29 DIAGNOSIS — G610 Guillain-Barre syndrome: Secondary | ICD-10-CM | POA: Diagnosis not present

## 2022-09-29 DIAGNOSIS — R5381 Other malaise: Secondary | ICD-10-CM | POA: Diagnosis not present

## 2022-09-29 LAB — BASIC METABOLIC PANEL
Anion gap: 7 (ref 5–15)
BUN: 8 mg/dL (ref 6–20)
CO2: 25 mmol/L (ref 22–32)
Calcium: 8.6 mg/dL — ABNORMAL LOW (ref 8.9–10.3)
Chloride: 103 mmol/L (ref 98–111)
Creatinine, Ser: 0.87 mg/dL (ref 0.44–1.00)
GFR, Estimated: >60 mL/min (ref >60)
Glucose, Bld: 129 mg/dL — ABNORMAL HIGH (ref 70–99)
Potassium: 4.2 mmol/L (ref 3.5–5.1)
Sodium: 135 mmol/L (ref 135–145)

## 2022-09-29 MED ORDER — METOPROLOL TARTRATE 50 MG PO TABS
50.0000 mg | ORAL_TABLET | Freq: Two times a day (BID) | ORAL | Status: DC
  Filled 2022-09-29: qty 1

## 2022-09-29 MED ORDER — METOPROLOL TARTRATE 50 MG PO TABS
50.0000 mg | ORAL_TABLET | Freq: Two times a day (BID) | ORAL | Status: DC
  Administered 2022-09-29 – 2022-10-02 (×6): 50 mg via ORAL
  Filled 2022-09-29 (×5): qty 1

## 2022-09-29 MED ORDER — HYDRALAZINE HCL 10 MG PO TABS
10.0000 mg | ORAL_TABLET | Freq: Three times a day (TID) | ORAL | Status: DC
  Administered 2022-09-29: 10 mg via ORAL
  Filled 2022-09-29 (×2): qty 1

## 2022-09-29 NOTE — Progress Notes (Signed)
Nif -40  VC 1.5 L

## 2022-09-29 NOTE — Progress Notes (Addendum)
NEUROLOGY CONSULTATION PROGRESS NOTE   Date of service: September 29, 2022 Patient Name: Dana [name] MRN:  [id number] DOB:  [birth year]    Interval Hx   She is sitting in the chair in NAD.  Patient received 4/5 IVIG treatment last night with no complications. No acute events overnight.   Nif is -40 and VC is 2.2L this am  She states she feels as if she has had some improvement. Still with c/o tingling in hands and feet but better than it has been   Vitals   Vitals:   09/29/22 0149 09/29/22 0306 09/29/22 0553 09/29/22 1015  BP: (!) 145/89 (!) 168/93 (!) 149/94 (!) 193/120  Pulse: 96 98 96 95  Resp: '16 16 18 17  '$ Temp: 97.7 F (36.5 C) 97.8 F (36.6 C) 97.9 F (36.6 C)   TempSrc: Oral Oral Oral   SpO2: 93% 95% 100% 100%  Weight:      Height:         Body mass index is 48.02 kg/m.  Physical Exam   General: Laying comfortably in bed; in no acute distress.  HENT: Normal oropharynx and mucosa. Normal external appearance of ears and nose.  Neck: Supple, no pain or tenderness  CV: No JVD. No peripheral edema.  Pulmonary: Symmetric Chest rise. Normal respiratory effort.  Abdomen: Soft to touch, non-tender.  Ext: No cyanosis, edema, or deformity  Skin: No rash. Normal palpation of skin.   Musculoskeletal: Normal digits and nails by inspection.  Neurologic Examination   Mental Status: AAOx4. Good attention. Able to provide clear history.    CN:  PERRL, EOMI, no gaze preference or ptosis.  Facial movements and sensation intact and symmetric Hearing intact to voice, shoulder shrug bilateral, midline tongue protrusion   Motor:  Reduced grip BUE. No pronator drift seen.  Able to lift BLE against gravity and hold against resistance, improved strength bilaterally  Sensation: Decreased proprioception in bl hands, pseudoathetosis present.  Decreased sensation left thigh area Burning and tingling to bilateral hands and feet.   Coordination/Complex Motor:  - Finger to  Nose: intact bilaterally    Labs   Basic Metabolic Panel:  Lab Results  Component Value Date   NA 135 09/29/2022   K 4.2 09/29/2022   CO2 25 09/29/2022   GLUCOSE 129 (H) 09/29/2022   BUN 8 09/29/2022   CREATININE 0.87 09/29/2022   CALCIUM 8.6 (L) 09/29/2022   GFRNONAA >60 09/29/2022   GFRAA >60 05/04/2019   HbA1c:  Lab Results  Component Value Date   HGBA1C 6.1 09/05/2022   LDL:  Lab Results  Component Value Date   LDLCALC 135 (H) 09/05/2022   Urine Drug Screen: No results found for: "LABOPIA", "COCAINSCRNUR", "LABBENZ", "AMPHETMU", "THCU", "LABBARB"  Alcohol Level No results found for: "ETH" No results found for: "PHENYTOIN", "ZONISAMIDE", "LAMOTRIGINE", "LEVETIRACETA" No results found for: "PHENYTOIN", "PHENOBARB", "VALPROATE", "CBMZ"  Imaging and Diagnostic studies  Results for orders placed during the hospital encounter of 09/23/22  CT Head Wo Contrast  Narrative CLINICAL DATA:  Headache  EXAM: CT HEAD WITHOUT CONTRAST  TECHNIQUE: Contiguous axial images were obtained from the base of the skull through the vertex without intravenous contrast.  RADIATION DOSE REDUCTION: This exam was performed according to the departmental dose-optimization program which includes automated exposure control, adjustment of the mA and/or kV according to patient size and/or use of iterative reconstruction technique.  COMPARISON:  None Available.  FINDINGS: Brain: No evidence of acute infarction, hemorrhage, hydrocephalus, extra-axial collection or mass  lesion/mass effect. Partially empty sella.  Vascular: No hyperdense vessel or unexpected calcification.  Skull: Normal. Negative for fracture or focal lesion.  Sinuses/Orbits: No acute finding.  Other: None.  IMPRESSION: No specific etiology for headaches identified.  Electronically Signed By: Marin Roberts M.D. On: 09/23/2022 17:01  Impression   57 year old female with ascending weakness and numbness. LP  results and assessment most consistent with Guillain-Barr syndrome. Treatment with 5 days IVIG with today being the last dose.  Maximal benefit from IVIG is seen at approximately 2 weeks from initiation, but even if she is not improved there is no evidence for repeated treatment, likely she does appear to be responding.   Recommendations   - Continue IVIG for 2 g/kg, daily, last dose today  - Continue NIFs/VIC Q12H - PT/OT - Out of bed to chair for all meals - Day/night cycle  - continue Gabapentin  -Neurology will be available as needed ______________________________________________________________________  Beulah Gandy ACNPC-AG, DNP [facility]  I have seen the patient and reviewed the above note.  She is having symptomatic improvement, and her strength appears to be improving as well, so I think she is responding to treatment.  At this point, with her stable respiratory numbers and improving motor exam, I think we can discontinue checking serial NIFs/VC.  At this point, following her final dose of IVIG today, she would not be a candidate for any type of further therapy either plasma exchange or repeat IVIG.  Treatment will be slowly physical therapy/rehab.  PT and OT are recommending CIR and I think this is appropriate.  I discussed the diagnosis, prognosis, and treatment plan with the patient and her husband who expressed understanding.  Neurology will be available on an as-needed basis, please call with further questions or concerns.  Roland Rack, MD [facility] [phone]  If 7pm- 7am, please page neurology on call as listed in [place].   At this point, no further testing and treatment will finish tonight, neurology will be available on an as-needed basis, please call with further questions or concerns.

## 2022-09-29 NOTE — Progress Notes (Signed)
PROGRESS NOTE    Dana [name]  HTD:[id number] DOB: [birth year] DOA: 09/23/2022 PCP: [name], MD   Brief Narrative: 57 year old with past medical history significant for B12 deficiency on B12 shots, anxiety, back pain, right breast mass, chronic opioid use, DJD, fibromyalgia, headache, hypercholesterolemia, mild hypertension, obesity and tobacco use who presented to the ED on Monday afternoon for evaluation of progressive limb weakness since December 8, followed by acute worsening on Thursday, weakness is ascending and involve legs and arms.  Neurology has been consulted and concern for GB syndrome.  Failed attempt for LP by IR 1/9, underwent LP 1/10.    Assessment & Plan:   Principal Problem:   Debility Active Problems:   Obesity   Low back pain with sciatica   HTN (hypertension)   B12 deficiency   Hypokalemia   UTI (urinary tract infection)   Hypomagnesemia   1-Ascending progressive weakness Neurology following concern for Ethelene Hal syndrome NIF -40 Underwent LP 1/10: CSF fluid: WBC 3, RBC 17,750, total protein 155, glucose 70. Started on IV Immune- globuline. 1/10 for 5 days. Day 5/5 improvement on LE weakness.  Hopefully CIR on Monday if insurance approved.   2-Essential hypertension: On as needed hydralazine. Start schedule Hydralazine.   Obesity: Needs Lifestyle modification   Hypokalemia: Replaced.    UTI: Urine retention, could be related to neurology process. Foley catheter placed 1/10 Continue Rocephin for 7 days.  Urine culture:  Growing E. coli and Klebsiella pneumoniae Fail voiding trial 1/13. Foley catheter placed.   B12 deficiency: Gets B12 shots as outpatient. Start oral.    Hypomagnesemia: Replaced.  Right arm heaviness; after magnesium infusion. Monitor. resolved  Estimated body mass index is 48.02 kg/m as calculated from the following:   Height as of this encounter: '5\' 4"'$  (1.626 m).   Weight as of this encounter: 126.9 kg.   DVT  prophylaxis: SCD holding blood thinner for LP Code Status: Full code Family Communication: care discussed with patient.  Disposition Plan:  Status is: Inpatient Remains inpatient appropriate because: management of weakness, she will need CIR    Consultants:  Neurology   Procedures:    Antimicrobials:    Subjective: Still having tingling lower extremities.  Weakness improved.  .  Objective: Vitals:   09/29/22 0149 09/29/22 0306 09/29/22 0553 09/29/22 1015  BP: (!) 145/89 (!) 168/93 (!) 149/94 (!) 193/120  Pulse: 96 98 96 95  Resp: '16 16 18 17  '$ Temp: 97.7 F (36.5 C) 97.8 F (36.6 C) 97.9 F (36.6 C)   TempSrc: Oral Oral Oral   SpO2: 93% 95% 100% 100%  Weight:      Height:        Intake/Output Summary (Last 24 hours) at 09/29/2022 1321 Last data filed at 09/29/2022 1300 Gross per 24 hour  Intake 1340 ml  Output 1950 ml  Net -610 ml    Filed Weights   09/25/22 1952 09/26/22 2103 09/28/22 2100  Weight: 126.9 kg 128.4 kg 126.9 kg    Examination:  General exam: NAD Respiratory system: CTA Cardiovascular system: S 1.  S 2 RRR Gastrointestinal system: BS present, soft, nt Central nervous system: Alert and oriented. BL LE and upper extremities weakness, weakness worse LE>  Extremities: No edema  Data Reviewed: I have personally reviewed following labs and imaging studies  CBC: Recent Labs  Lab 09/23/22 1331 09/24/22 0410 09/24/22 1001 09/27/22 0628  WBC 10.9* 10.1 8.1 5.3  NEUTROABS 7.9*  --  5.8  --  HGB 13.1 11.5* 11.9* 11.1*  HCT 41.5 35.8* 37.7 34.2*  MCV 93.7 93.2 95.2 92.4  PLT 395 342 324 127    Basic Metabolic Panel: Recent Labs  Lab 09/23/22 1331 09/23/22 2318 09/24/22 1001 09/27/22 0628 09/29/22 0517  NA 139 139 138 136 135  K 3.1* 3.1* 3.5 4.1 4.2  CL 98 101 104 102 103  CO2 '25 26 25 24 25  '$ GLUCOSE 115* 99 125* 103* 129*  BUN '10 7 6 7 8  '$ CREATININE 0.86 0.87 0.73 0.76 0.87  CALCIUM 8.7* 8.4* 8.1* 8.3* 8.6*  MG  --  1.5* 1.9  1.7  --   PHOS  --  3.6  --   --   --     GFR: Estimated Creatinine Clearance: 94.2 mL/min (by C-G formula based on SCr of 0.87 mg/dL). Liver Function Tests: Recent Labs  Lab 09/23/22 1331  AST 33  ALT 19  ALKPHOS 79  BILITOT 0.9  PROT 6.8  ALBUMIN 3.1*    No results for input(s): "LIPASE", "AMYLASE" in the last 168 hours. No results for input(s): "AMMONIA" in the last 168 hours. Coagulation Profile: Recent Labs  Lab 09/23/22 2318  INR 1.0    Cardiac Enzymes: No results for input(s): "CKTOTAL", "CKMB", "CKMBINDEX", "TROPONINI" in the last 168 hours. BNP (last 3 results) No results for input(s): "PROBNP" in the last 8760 hours. HbA1C: No results for input(s): "HGBA1C" in the last 72 hours. CBG: No results for input(s): "GLUCAP" in the last 168 hours. Lipid Profile: No results for input(s): "CHOL", "HDL", "LDLCALC", "TRIG", "CHOLHDL", "LDLDIRECT" in the last 72 hours. Thyroid Function Tests: No results for input(s): "TSH", "T4TOTAL", "FREET4", "T3FREE", "THYROIDAB" in the last 72 hours.  Anemia Panel: No results for input(s): "VITAMINB12", "FOLATE", "FERRITIN", "TIBC", "IRON", "RETICCTPCT" in the last 72 hours.  Sepsis Labs: Recent Labs  Lab 09/23/22 2318  LATICACIDVEN 1.2     Recent Results (from the past 240 hour(s))  Resp panel by RT-PCR (RSV, Flu A&B, Covid) Anterior Nasal Swab     Status: None   Collection Time: 09/23/22  1:31 PM   Specimen: Anterior Nasal Swab  Result Value Ref Range Status   SARS Coronavirus 2 by RT PCR NEGATIVE NEGATIVE Final    Comment: (NOTE) SARS-CoV-2 target nucleic acids are NOT DETECTED.  The SARS-CoV-2 RNA is generally detectable in upper respiratory specimens during the acute phase of infection. The lowest concentration of SARS-CoV-2 viral copies this assay can detect is 138 copies/mL. A negative result does not preclude SARS-Cov-2 infection and should not be used as the sole basis for treatment or other patient  management decisions. A negative result may occur with  improper specimen collection/handling, submission of specimen other than nasopharyngeal swab, presence of viral mutation(s) within the areas targeted by this assay, and inadequate number of viral copies(<138 copies/mL). A negative result must be combined with clinical observations, patient history, and epidemiological information. The expected result is Negative.  Fact Sheet for Patients:  EntrepreneurPulse.com.au  Fact Sheet for Healthcare Providers:  IncredibleEmployment.be  This test is no t yet approved or cleared by the [place] FDA and  has been authorized for detection and/or diagnosis of SARS-CoV-2 by FDA under an Emergency Use Authorization (EUA). This EUA will remain  in effect (meaning this test can be used) for the duration of the COVID-19 declaration under Section 564(b)(1) of the Act, 21 U.S.C.section 360bbb-3(b)(1), unless the authorization is terminated  or revoked sooner.       Influenza  A by PCR NEGATIVE NEGATIVE Final   Influenza B by PCR NEGATIVE NEGATIVE Final    Comment: (NOTE) The Xpert Xpress SARS-CoV-2/FLU/RSV plus assay is intended as an aid in the diagnosis of influenza from Nasopharyngeal swab specimens and should not be used as a sole basis for treatment. Nasal washings and aspirates are unacceptable for Xpert Xpress SARS-CoV-2/FLU/RSV testing.  Fact Sheet for Patients: EntrepreneurPulse.com.au  Fact Sheet for Healthcare Providers: IncredibleEmployment.be  This test is not yet approved or cleared by the [place] FDA and has been authorized for detection and/or diagnosis of SARS-CoV-2 by FDA under an Emergency Use Authorization (EUA). This EUA will remain in effect (meaning this test can be used) for the duration of the COVID-19 declaration under Section 564(b)(1) of the Act, 21 U.S.C. section 360bbb-3(b)(1),  unless the authorization is terminated or revoked.     Resp Syncytial Virus by PCR NEGATIVE NEGATIVE Final    Comment: (NOTE) Fact Sheet for Patients: EntrepreneurPulse.com.au  Fact Sheet for Healthcare Providers: IncredibleEmployment.be  This test is not yet approved or cleared by the [place] FDA and has been authorized for detection and/or diagnosis of SARS-CoV-2 by FDA under an Emergency Use Authorization (EUA). This EUA will remain in effect (meaning this test can be used) for the duration of the COVID-19 declaration under Section 564(b)(1) of the Act, 21 U.S.C. section 360bbb-3(b)(1), unless the authorization is terminated or revoked.  Performed at [facility], [place], [place] [address] [place], [place] [address]   Urine Culture     Status: Abnormal   Collection Time: 09/24/22  1:39 AM   Specimen: Urine, Clean Catch  Result Value Ref Range Status   Specimen Description URINE, CLEAN CATCH  Final   Special Requests   Final    NONE Performed at [facility], [place] [address] [place], [place] [address]    Culture (A)  Final    >=100,000 COLONIES/mL ESCHERICHIA COLI 80,000 COLONIES/mL KLEBSIELLA PNEUMONIAE    Report Status 09/26/2022 FINAL  Final   Organism ID, Bacteria ESCHERICHIA COLI (A)  Final   Organism ID, Bacteria KLEBSIELLA PNEUMONIAE (A)  Final      Susceptibility   Escherichia coli - MIC*    AMPICILLIN <=2 SENSITIVE Sensitive     CEFAZOLIN <=4 SENSITIVE Sensitive     CEFEPIME <=0.12 SENSITIVE Sensitive     CEFTRIAXONE <=0.25 SENSITIVE Sensitive     CIPROFLOXACIN <=0.25 SENSITIVE Sensitive     GENTAMICIN <=1 SENSITIVE Sensitive     IMIPENEM <=0.25 SENSITIVE Sensitive     NITROFURANTOIN <=16 SENSITIVE Sensitive     TRIMETH/SULFA <=20 SENSITIVE Sensitive     AMPICILLIN/SULBACTAM <=2 SENSITIVE Sensitive     PIP/TAZO <=4 SENSITIVE Sensitive     * >=100,000 COLONIES/mL ESCHERICHIA COLI   Klebsiella  pneumoniae - MIC*    AMPICILLIN >=32 RESISTANT Resistant     CEFAZOLIN <=4 SENSITIVE Sensitive     CEFEPIME <=0.12 SENSITIVE Sensitive     CEFTRIAXONE <=0.25 SENSITIVE Sensitive     CIPROFLOXACIN <=0.25 SENSITIVE Sensitive     GENTAMICIN <=1 SENSITIVE Sensitive     IMIPENEM <=0.25 SENSITIVE Sensitive     NITROFURANTOIN 32 SENSITIVE Sensitive     TRIMETH/SULFA <=20 SENSITIVE Sensitive     AMPICILLIN/SULBACTAM 4 SENSITIVE Sensitive     PIP/TAZO <=4 SENSITIVE Sensitive     * 80,000 COLONIES/mL KLEBSIELLA PNEUMONIAE         Radiology Studies: No results found.      Scheduled Meds:  Chlorhexidine Gluconate Cloth  6 each Topical Daily   folic acid  1 mg Oral Daily   gabapentin  600 mg Oral TID   hydrALAZINE  10 mg Oral Q8H   latanoprost  1 drop Both Eyes QHS   lidocaine  1 patch Transdermal Q24H   multivitamin with minerals  1 tablet Oral Daily   pantoprazole  40 mg Oral Daily   senna-docusate  1 tablet Oral BID   vitamin B-12  100 mcg Oral Daily   Continuous Infusions:  cefTRIAXone (ROCEPHIN)  IV Stopped (09/29/22 0340)   Immune Globulin 10% Stopped (09/29/22 0001)     LOS: 5 days    Time spent: 35 minutes    Adger Cantera A Archer Vise, MD [facility]   If 7PM-7AM, please contact night-coverage [email or web address]  09/29/2022, 1:21 PM

## 2022-09-29 NOTE — Progress Notes (Signed)
RT Note:  NIF -40 VC 2.2L  Good patient effort.

## 2022-09-30 MED ORDER — POTASSIUM CHLORIDE CRYS ER 20 MEQ PO TBCR
20.0000 meq | EXTENDED_RELEASE_TABLET | Freq: Once | ORAL | Status: AC
  Administered 2022-09-30: 20 meq via ORAL
  Filled 2022-09-30: qty 1

## 2022-09-30 MED ORDER — FUROSEMIDE 40 MG PO TABS
40.0000 mg | ORAL_TABLET | Freq: Two times a day (BID) | ORAL | Status: DC
  Administered 2022-09-30 – 2022-10-02 (×5): 40 mg via ORAL
  Filled 2022-09-30 (×4): qty 1

## 2022-09-30 MED ORDER — FUROSEMIDE 40 MG PO TABS
40.0000 mg | ORAL_TABLET | Freq: Two times a day (BID) | ORAL | Status: DC
  Filled 2022-09-30: qty 1

## 2022-09-30 MED FILL — Immune Globulin (Human) IV Soln 10 GM/100ML: INTRAVENOUS | Qty: 300 | Status: AC

## 2022-09-30 MED FILL — Immune Globulin (Human) IV Soln 5 GM/50ML: INTRAVENOUS | Qty: 50 | Status: AC

## 2022-09-30 NOTE — Progress Notes (Signed)
Inpatient Rehab Admissions Coordinator:   I continue to await insurance auth for CIR. I reviewed ELOS and benefits letter with pt. And she states understanding and agreement.   Clemens Catholic, [place], [place] Admissions Coordinator  [phone] ([place]) [phone] (office)

## 2022-09-30 NOTE — Progress Notes (Signed)
Physical Therapy Treatment Patient Details Name: Dana [name] MRN: [id number] DOB: Dec 30, 1965 Today's Date: 09/30/2022   History of Present Illness Pt is a 57 y/o female who presented with progressive weakness and sensation changes, consistent with Guillain-Barr syndrome. MRI negative. LP results and assessment most consistent with Guillain-Barr syndrome.  PMH includes anxiety, DJD, fibromyalgia, HTN, tobacco use.    PT Comments    Continues to put forth great effort with physical therapy. Session focused on sit<>stand transfers with [facility] and RW from various surfaces. Requires mod Assist, very anxious with RW, extensive education provided. Pre-gait activity with Stedy, shifting weight and able to clear feet in alternating pattern without knees braced on pad. Patient will continue to benefit from skilled physical therapy services to further improve independence with functional mobility.    Recommendations for follow up therapy are one component of a multi-disciplinary discharge planning process, led by the attending physician.  Recommendations may be updated based on patient status, additional functional criteria and insurance authorization.  Follow Up Recommendations  Acute inpatient rehab (3hours/day)     Assistance Recommended at Discharge Frequent or constant Supervision/Assistance  Patient can return home with the following A lot of help with bathing/dressing/bathroom;Assist for transportation;Help with stairs or ramp for entrance;Assistance with cooking/housework;Two people to help with walking and/or transfers   Equipment Recommendations  Other (comment) (TBD)    Recommendations for Other Services Rehab consult     Precautions / Restrictions Precautions Precautions: Fall Restrictions Weight Bearing Restrictions: No     Mobility  Bed Mobility Overal bed mobility: Needs Assistance Bed Mobility: Supine to Sit Rolling: Min assist   Supine to sit: Min assist, HOB  elevated     General bed mobility comments: Min assist for very light support of LLE, moving it more independently today after reducing friction on bed. Wanted to teach log roll for sidelying/sit but RUE bothering her from IV site. Pt shows better trunk stability with rise to EOB, HOB elevated, able to pull lightly through therapist hand.    Transfers Overall transfer level: Needs assistance Equipment used: Ambulation equipment used, Rolling walker (2 wheels) Transfers: Sit to/from Stand, Bed to chair/wheelchair/BSC Sit to Stand: Mod assist, Min assist           General transfer comment: Mod assist for boost to stand from bed and BSC with Stedy, followed by  mod assist si<>stand transfer from recliner using RW. Cues for technique. Pt leans forward excessively until cued, but seems to becoming better aware of this and making attempts to correct. Stedy used to transfer. Min assist to rise from [facility] for balance. Very anxious standing with RW, seemingly unaware of adequate strength in LEs, perhaps proprioceptive deficits causing this amount of fear with RW use. Transfer via Lift Equipment: Stedy  Ambulation/Gait             Pre-gait activities: Stood with [employer] and progressed with RW. Focusing on weight shift, upright posture, able to clear feet from floor with weight shift on Stedy, no overt buckling with this device, but noted with RW support only.     Stairs             Wheelchair Mobility    Modified Rankin (Stroke Patients Only)       Balance Overall balance assessment: Needs assistance Sitting-balance support: No upper extremity supported, Feet unsupported Sitting balance-Leahy Scale: Fair     Standing balance support: Bilateral upper extremity supported, During functional activity Standing balance-Leahy Scale: Poor Standing balance comment: reliant  on BUE support with stedy and RW                            Cognition Arousal/Alertness:  Awake/alert Behavior During Therapy: WFL for tasks assessed/performed Overall Cognitive Status: Within Functional Limits for tasks assessed                                          Exercises      General Comments General comments (skin integrity, edema, etc.): attempted to void in bathroom on BSC - flatulence only.      Pertinent Vitals/Pain Pain Assessment Pain Assessment: Faces Faces Pain Scale: Hurts whole lot Pain Location: Back. BIL feet tingling, LLE numbness. Pain Descriptors / Indicators: Aching, Grimacing Pain Intervention(s): Monitored during session    Home Living                          Prior Function            PT Goals (current goals can now be found in the care plan section) Acute Rehab PT Goals Patient Stated Goal: regain her strength PT Goal Formulation: With patient Time For Goal Achievement: 10/08/22 Potential to Achieve Goals: Good Progress towards PT goals: Progressing toward goals    Frequency    Min 4X/week      PT Plan Current plan remains appropriate    Co-evaluation PT/OT/SLP Co-Evaluation/Treatment: Yes            AM-PAC PT "6 Clicks" Mobility   Outcome Measure  Help needed turning from your back to your side while in a flat bed without using bedrails?: A Little Help needed moving from lying on your back to sitting on the side of a flat bed without using bedrails?: A Little Help needed moving to and from a bed to a chair (including a wheelchair)?: Total Help needed standing up from a chair using your arms (e.g., wheelchair or bedside chair)?: A Lot Help needed to walk in hospital room?: Total Help needed climbing 3-5 steps with a railing? : Total 6 Click Score: 11    End of Session Equipment Utilized During Treatment: Gait belt Activity Tolerance: Patient tolerated treatment well Patient left: in chair;with call bell/phone within reach Nurse Communication: Mobility status;Need for lift  equipment PT Visit Diagnosis: Other abnormalities of gait and mobility (R26.89);Muscle weakness (generalized) (M62.81);Other symptoms and signs involving the nervous system (R29.898)     Time: 8250-0370 PT Time Calculation (min) (ACUTE ONLY): 56 min  Charges:  $Therapeutic Activity: 38-52 mins $Neuromuscular Re-education: 8-22 mins                     Candie Mile, PT, DPT Physical Therapist Acute Rehabilitation Services [place] 09/30/2022, 10:32 AM

## 2022-09-30 NOTE — Progress Notes (Addendum)
Inpatient Rehab Admissions Coordinator:  Pt not medically ready for potential CIR admission. Continues on IVIG. Also awaiting insurance authorization. Will continue to follow.  ADDENDUM 1351: Pt completed IVIG treatment.  Gayland Curry, [place], [place] Admissions Coordinator [phone]

## 2022-09-30 NOTE — Progress Notes (Signed)
PROGRESS NOTE    Dana [name]  FAO:[id number] DOB: 23-Dec-1965 DOA: 09/23/2022 PCP: [name], MD   Brief Narrative: 56 year old with past medical history significant for B12 deficiency on B12 shots, anxiety, back pain, right breast mass, chronic opioid use, DJD, fibromyalgia, headache, hypercholesterolemia, mild hypertension, obesity and tobacco use who presented to the ED on Monday afternoon for evaluation of progressive limb weakness since December 8, followed by acute worsening on Thursday, weakness is ascending and involve legs and arms.  Neurology has been consulted and concern for GB syndrome.  Failed attempt for LP by IR 1/9, underwent LP 1/10.    Assessment & Plan:   Principal Problem:   Debility Active Problems:   Obesity   Low back pain with sciatica   HTN (hypertension)   B12 deficiency   Hypokalemia   UTI (urinary tract infection)   Hypomagnesemia   1-Ascending progressive weakness Neurology following concern for Ethelene Hal syndrome NIF -40 Underwent LP 1/10: CSF fluid: WBC 3, RBC 17,750, total protein 155, glucose 70. Started on IV Immune- globuline. 1/10 for 5 days. Completed IVIG improvement on LE weakness.  Hopefully CIR on Monday if insurance approved.   2-Essential hypertension: Relates headaches with hydralazine.  Will resume lasix.  Continue with metoprolol;  Obesity: Needs Lifestyle modification   Hypokalemia: Replaced.    UTI: Urine retention, could be related to neurology process. Foley catheter placed 1/10 Plan to treat with ceftriaxone for 7 days.  Urine culture:  Growing E. coli and Klebsiella pneumoniae Fail voiding trial 1/13. Foley catheter placed.   B12 deficiency: Gets B12 shots as outpatient. Started  oral.    Hypomagnesemia: Replaced.  Right arm heaviness; after magnesium infusion. Monitor. resolved  Estimated body mass index is 48.02 kg/m as calculated from the following:   Height as of this encounter: '5\' 4"'$  (1.626  m).   Weight as of this encounter: 126.9 kg.   DVT prophylaxis: SCD holding blood thinner for LP Code Status: Full code Family Communication: care discussed with patient.  Disposition Plan:  Status is: Inpatient Remains inpatient appropriate because: management of weakness, she will need CIR    Consultants:  Neurology   Procedures:    Antimicrobials:    Subjective: Report doing ok. She is ready to go to CIR .  Objective: Vitals:   09/29/22 2042 09/29/22 2308 09/30/22 0541 09/30/22 0855  BP: (!) 154/95 (!) 149/105 (!) 168/97 (!) 173/89  Pulse: 85 87 87 89  Resp: '18 18 18 17  '$ Temp: 98.6 F (37 C) 98.7 F (37.1 C) 98.4 F (36.9 C) 98.4 F (36.9 C)  TempSrc: Oral Oral Oral   SpO2: 99% 96% 99% 100%  Weight:      Height:        Intake/Output Summary (Last 24 hours) at 09/30/2022 1423 Last data filed at 09/30/2022 1300 Gross per 24 hour  Intake 1250 ml  Output 3450 ml  Net -2200 ml    Filed Weights   09/25/22 1952 09/26/22 2103 09/28/22 2100  Weight: 126.9 kg 128.4 kg 126.9 kg    Examination:  General exam:NAD Respiratory system:  CTA Cardiovascular system: S 1, S 2 RRR Gastrointestinal system: BS present, soft, nt Central nervous system: Alert and oriented. BL LE and upper extremities weakness, weakness worse LE>  Extremities: No edema  Data Reviewed: I have personally reviewed following labs and imaging studies  CBC: Recent Labs  Lab 09/24/22 0410 09/24/22 1001 09/27/22 0628  WBC 10.1 8.1 5.3  NEUTROABS  --  5.8  --   HGB 11.5* 11.9* 11.1*  HCT 35.8* 37.7 34.2*  MCV 93.2 95.2 92.4  PLT 342 324 254    Basic Metabolic Panel: Recent Labs  Lab 09/23/22 2318 09/24/22 1001 09/27/22 0628 09/29/22 0517  NA 139 138 136 135  K 3.1* 3.5 4.1 4.2  CL 101 104 102 103  CO2 '26 25 24 25  '$ GLUCOSE 99 125* 103* 129*  BUN '7 6 7 8  '$ CREATININE 0.87 0.73 0.76 0.87  CALCIUM 8.4* 8.1* 8.3* 8.6*  MG 1.5* 1.9 1.7  --   PHOS 3.6  --   --   --      GFR: Estimated Creatinine Clearance: 94.2 mL/min (by C-G formula based on SCr of 0.87 mg/dL). Liver Function Tests: No results for input(s): "AST", "ALT", "ALKPHOS", "BILITOT", "PROT", "ALBUMIN" in the last 168 hours.  No results for input(s): "LIPASE", "AMYLASE" in the last 168 hours. No results for input(s): "AMMONIA" in the last 168 hours. Coagulation Profile: Recent Labs  Lab 09/23/22 2318  INR 1.0    Cardiac Enzymes: No results for input(s): "CKTOTAL", "CKMB", "CKMBINDEX", "TROPONINI" in the last 168 hours. BNP (last 3 results) No results for input(s): "PROBNP" in the last 8760 hours. HbA1C: No results for input(s): "HGBA1C" in the last 72 hours. CBG: No results for input(s): "GLUCAP" in the last 168 hours. Lipid Profile: No results for input(s): "CHOL", "HDL", "LDLCALC", "TRIG", "CHOLHDL", "LDLDIRECT" in the last 72 hours. Thyroid Function Tests: No results for input(s): "TSH", "T4TOTAL", "FREET4", "T3FREE", "THYROIDAB" in the last 72 hours.  Anemia Panel: No results for input(s): "VITAMINB12", "FOLATE", "FERRITIN", "TIBC", "IRON", "RETICCTPCT" in the last 72 hours.  Sepsis Labs: Recent Labs  Lab 09/23/22 2318  LATICACIDVEN 1.2     Recent Results (from the past 240 hour(s))  Resp panel by RT-PCR (RSV, Flu A&B, Covid) Anterior Nasal Swab     Status: None   Collection Time: 09/23/22  1:31 PM   Specimen: Anterior Nasal Swab  Result Value Ref Range Status   SARS Coronavirus 2 by RT PCR NEGATIVE NEGATIVE Final    Comment: (NOTE) SARS-CoV-2 target nucleic acids are NOT DETECTED.  The SARS-CoV-2 RNA is generally detectable in upper respiratory specimens during the acute phase of infection. The lowest concentration of SARS-CoV-2 viral copies this assay can detect is 138 copies/mL. A negative result does not preclude SARS-Cov-2 infection and should not be used as the sole basis for treatment or other patient management decisions. A negative result may occur with   improper specimen collection/handling, submission of specimen other than nasopharyngeal swab, presence of viral mutation(s) within the areas targeted by this assay, and inadequate number of viral copies(<138 copies/mL). A negative result must be combined with clinical observations, patient history, and epidemiological information. The expected result is Negative.  Fact Sheet for Patients:  EntrepreneurPulse.com.au  Fact Sheet for Healthcare Providers:  IncredibleEmployment.be  This test is no t yet approved or cleared by the [place] FDA and  has been authorized for detection and/or diagnosis of SARS-CoV-2 by FDA under an Emergency Use Authorization (EUA). This EUA will remain  in effect (meaning this test can be used) for the duration of the COVID-19 declaration under Section 564(b)(1) of the Act, 21 U.S.C.section 360bbb-3(b)(1), unless the authorization is terminated  or revoked sooner.       Influenza A by PCR NEGATIVE NEGATIVE Final   Influenza B by PCR NEGATIVE NEGATIVE Final    Comment: (NOTE) The Xpert Xpress SARS-CoV-2/FLU/RSV plus assay  is intended as an aid in the diagnosis of influenza from Nasopharyngeal swab specimens and should not be used as a sole basis for treatment. Nasal washings and aspirates are unacceptable for Xpert Xpress SARS-CoV-2/FLU/RSV testing.  Fact Sheet for Patients: EntrepreneurPulse.com.au  Fact Sheet for Healthcare Providers: IncredibleEmployment.be  This test is not yet approved or cleared by the [place] FDA and has been authorized for detection and/or diagnosis of SARS-CoV-2 by FDA under an Emergency Use Authorization (EUA). This EUA will remain in effect (meaning this test can be used) for the duration of the COVID-19 declaration under Section 564(b)(1) of the Act, 21 U.S.C. section 360bbb-3(b)(1), unless the authorization is terminated or revoked.      Resp Syncytial Virus by PCR NEGATIVE NEGATIVE Final    Comment: (NOTE) Fact Sheet for Patients: EntrepreneurPulse.com.au  Fact Sheet for Healthcare Providers: IncredibleEmployment.be  This test is not yet approved or cleared by the [place] FDA and has been authorized for detection and/or diagnosis of SARS-CoV-2 by FDA under an Emergency Use Authorization (EUA). This EUA will remain in effect (meaning this test can be used) for the duration of the COVID-19 declaration under Section 564(b)(1) of the Act, 21 U.S.C. section 360bbb-3(b)(1), unless the authorization is terminated or revoked.  Performed at [facility], [place] [address] [place], [place] [address]   Urine Culture     Status: Abnormal   Collection Time: 09/24/22  1:39 AM   Specimen: Urine, Clean Catch  Result Value Ref Range Status   Specimen Description URINE, CLEAN CATCH  Final   Special Requests   Final    NONE Performed at [facility], [place] [address] [place], [place] [address]    Culture (A)  Final    >=100,000 COLONIES/mL ESCHERICHIA COLI 80,000 COLONIES/mL KLEBSIELLA PNEUMONIAE    Report Status 09/26/2022 FINAL  Final   Organism ID, Bacteria ESCHERICHIA COLI (A)  Final   Organism ID, Bacteria KLEBSIELLA PNEUMONIAE (A)  Final      Susceptibility   Escherichia coli - MIC*    AMPICILLIN <=2 SENSITIVE Sensitive     CEFAZOLIN <=4 SENSITIVE Sensitive     CEFEPIME <=0.12 SENSITIVE Sensitive     CEFTRIAXONE <=0.25 SENSITIVE Sensitive     CIPROFLOXACIN <=0.25 SENSITIVE Sensitive     GENTAMICIN <=1 SENSITIVE Sensitive     IMIPENEM <=0.25 SENSITIVE Sensitive     NITROFURANTOIN <=16 SENSITIVE Sensitive     TRIMETH/SULFA <=20 SENSITIVE Sensitive     AMPICILLIN/SULBACTAM <=2 SENSITIVE Sensitive     PIP/TAZO <=4 SENSITIVE Sensitive     * >=100,000 COLONIES/mL ESCHERICHIA COLI   Klebsiella pneumoniae - MIC*    AMPICILLIN >=32 RESISTANT Resistant      CEFAZOLIN <=4 SENSITIVE Sensitive     CEFEPIME <=0.12 SENSITIVE Sensitive     CEFTRIAXONE <=0.25 SENSITIVE Sensitive     CIPROFLOXACIN <=0.25 SENSITIVE Sensitive     GENTAMICIN <=1 SENSITIVE Sensitive     IMIPENEM <=0.25 SENSITIVE Sensitive     NITROFURANTOIN 32 SENSITIVE Sensitive     TRIMETH/SULFA <=20 SENSITIVE Sensitive     AMPICILLIN/SULBACTAM 4 SENSITIVE Sensitive     PIP/TAZO <=4 SENSITIVE Sensitive     * 80,000 COLONIES/mL KLEBSIELLA PNEUMONIAE         Radiology Studies: No results found.      Scheduled Meds:  Chlorhexidine Gluconate Cloth  6 each Topical Daily   folic acid  1 mg Oral Daily   furosemide  40 mg Oral BID   gabapentin  600 mg Oral TID   latanoprost  1 drop Both Eyes QHS   lidocaine  1 patch Transdermal Q24H   metoprolol tartrate  50 mg Oral BID   multivitamin with minerals  1 tablet Oral Daily   pantoprazole  40 mg Oral Daily   potassium chloride  20 mEq Oral Once   senna-docusate  1 tablet Oral BID   vitamin B-12  100 mcg Oral Daily   Continuous Infusions:  cefTRIAXone (ROCEPHIN)  IV 1 g (09/29/22 2209)     LOS: 6 days    Time spent: 35 minutes    Kamille Toomey A Kennedy Brines, MD [facility]   If 7PM-7AM, please contact night-coverage [email or web address]  09/30/2022, 2:23 PM

## 2022-09-30 NOTE — TOC Progression Note (Signed)
Transition of Care [facility]) - Progression Note    Patient Details  Name: Dana [name] MRN: [id number] Date of Birth: [birth year]  Transition [facility]) CM/SW Contact  Tom-Johnson, Renea Ee, RN Phone Number: 09/30/2022, 1:53 PM  Clinical Narrative:     Patient completed IVIG treatment. CIR pending insurance auth for admission. CM will continue to follow.       Expected Discharge Plan: IP Rehab Facility Barriers to Discharge: Continued Medical Work up  Expected Discharge Plan and Services In-house Referral: Chaplain Discharge Planning Services: CM Consult Post Acute Care Choice: IP Rehab Living arrangements for the past 2 months: Single Family Home                           HH Arranged: NA [place] Agency: NA         Social Determinants of Health (SDOH) Interventions SDOH Screenings   Food Insecurity: No Food Insecurity (05/06/2022)  Transportation Needs: No Transportation Needs (05/06/2022)  Depression (PHQ2-9): Low Risk  (09/05/2022)  Financial Resource Strain: Low Risk  (05/06/2022)  Physical Activity: Inactive (05/06/2022)  Stress: No Stress Concern Present (05/06/2022)  Tobacco Use: Medium Risk (09/23/2022)    Readmission Risk Interventions     No data to display

## 2022-09-30 NOTE — Progress Notes (Signed)
Occupational Therapy Treatment Patient Details Name: Dana [name] MRN: [id number] DOB: 1966/09/16 Today's Date: 09/30/2022   History of present illness Pt is a 57 y/o female who presented with progressive weakness and sensation changes, consistent with Guillain-Barr syndrome. MRI negative. LP results and assessment most consistent with Guillain-Barr syndrome.  PMH includes anxiety, DJD, fibromyalgia, HTN, tobacco use.   OT comments  Patient with continued fair progress toward patient focused goals.  Focus of treatment was to strengthen core to increase independence with ADL and toileting.  Patient demonstrating increased ability to accept challenges in all directions while sitting unsupported.  Also able to stand 3/4 of way from seated position in hopes of improving toileting abilities at RW level.  OT continues to be indicated in the acute setting to address deficits listed below, and help assist with an eventual transition to AIR for post acute rehab.  Patient remains very motivated, has great potential to achieve a Mod I level, and can tolerate 3+ hours of rehab each day.     Recommendations for follow up therapy are one component of a multi-disciplinary discharge planning process, led by the attending physician.  Recommendations may be updated based on patient status, additional functional criteria and insurance authorization.    Follow Up Recommendations  Acute inpatient rehab (3hours/day)     Assistance Recommended at Discharge Frequent or constant Supervision/Assistance  Patient can return home with the following  Two people to help with walking and/or transfers;A lot of help with bathing/dressing/bathroom;Assistance with cooking/housework;Assist for transportation;Help with stairs or ramp for entrance   Equipment Recommendations  BSC/3in1;Wheelchair (measurements OT);Wheelchair cushion (measurements OT);Tub/shower bench    Recommendations for Other Services      Precautions /  Restrictions Precautions Precautions: Fall Restrictions Weight Bearing Restrictions: No       Mobility Bed Mobility               General bed mobility comments: up in the recliner    Transfers Overall transfer level: Needs assistance   Transfers: Sit to/from Stand Sit to Stand: Mod assist                 Balance Overall balance assessment: Needs assistance Sitting-balance support: Bilateral upper extremity supported, Feet supported Sitting balance-Leahy Scale: Fair                                     ADL either performed or assessed with clinical judgement   ADL   Eating/Feeding: Set up;Supervision/ safety Eating/Feeding Details (indicate cue type and reason): with use of red tubing, attempted use of fork without foam.  Increased spillage and dropping of untensils.             Upper Body Dressing : Sitting;Set up   Lower Body Dressing: Maximal assistance;Bed level                      Extremity/Trunk Assessment Upper Extremity Assessment RUE Coordination: decreased fine motor;decreased gross motor LUE Coordination: decreased fine motor;decreased gross motor   Lower Extremity Assessment Lower Extremity Assessment: Defer to PT evaluation        Vision       Perception     Praxis      Cognition Arousal/Alertness: Awake/alert Behavior During Therapy: WFL for tasks assessed/performed Overall Cognitive Status: Within Functional Limits for tasks assessed  Exercises General Exercises - Upper Extremity Elbow Flexion: Strengthening, Both, Seated, Theraband, 10 reps Theraband Level (Elbow Flexion): Level 1 (Yellow) Elbow Extension: Strengthening, Both, Seated, Theraband, 10 reps Theraband Level (Elbow Extension): Level 1 (Yellow) Chair Push Up: AROM, 10 reps, Seated Other Exercises Other Exercises: Isometric core stabilization exercises.  Done seated leaning away  from chair back.  2 sets of 3 forward/back and left/right    Shoulder Instructions       General Comments      Pertinent Vitals/ Pain       Pain Assessment Pain Assessment: Faces Faces Pain Scale: Hurts little more Pain Location: Back. BIL feet tingling, LLE numbness. Pain Descriptors / Indicators: Aching, Tightness, Tingling, Tiring Pain Intervention(s): Monitored during session                                                          Frequency  Min 2X/week        Progress Toward Goals  OT Goals(current goals can now be found in the care plan section)  Progress towards OT goals: Progressing toward goals  Acute Rehab OT Goals OT Goal Formulation: With patient Time For Goal Achievement: 10/08/22 Potential to Achieve Goals: Good  Plan Discharge plan remains appropriate    Co-evaluation                 AM-PAC OT "6 Clicks" Daily Activity     Outcome Measure   Help from another person eating meals?: A Little Help from another person taking care of personal grooming?: A Little Help from another person toileting, which includes using toliet, bedpan, or urinal?: A Lot Help from another person bathing (including washing, rinsing, drying)?: A Lot Help from another person to put on and taking off regular upper body clothing?: A Little Help from another person to put on and taking off regular lower body clothing?: A Lot 6 Click Score: 15    End of Session    OT Visit Diagnosis: Other abnormalities of gait and mobility (R26.89);Muscle weakness (generalized) (M62.81)   Activity Tolerance Patient tolerated treatment well   Patient Left in chair;with call bell/phone within reach   Nurse Communication Mobility status        Time: 0973-5329 OT Time Calculation (min): 18 min  Charges: OT General Charges $OT Visit: 1 Visit OT Treatments $Therapeutic Activity: 8-22 mins  09/30/2022  RP, OTR/L  Acute Rehabilitation  Services  Office:  [phone]   Metta Clines 09/30/2022, 2:07 PM

## 2022-10-01 DIAGNOSIS — G610 Guillain-Barre syndrome: Secondary | ICD-10-CM

## 2022-10-01 LAB — BASIC METABOLIC PANEL
Anion gap: 8 (ref 5–15)
BUN: 11 mg/dL (ref 6–20)
CO2: 26 mmol/L (ref 22–32)
Calcium: 8.8 mg/dL — ABNORMAL LOW (ref 8.9–10.3)
Chloride: 100 mmol/L (ref 98–111)
Creatinine, Ser: 0.75 mg/dL (ref 0.44–1.00)
GFR, Estimated: >60 mL/min (ref >60)
Glucose, Bld: 109 mg/dL — ABNORMAL HIGH (ref 70–99)
Potassium: 4.5 mmol/L (ref 3.5–5.1)
Sodium: 134 mmol/L — ABNORMAL LOW (ref 135–145)

## 2022-10-01 MED ORDER — ADULT MULTIVITAMIN W/MINERALS CH: 1.0000 | ORAL_TABLET | Freq: Every day | ORAL | 0 refills | Status: AC

## 2022-10-01 MED ORDER — GABAPENTIN 300 MG PO CAPS: 600.0000 mg | ORAL_CAPSULE | Freq: Three times a day (TID) | ORAL | 0 refills | Status: DC

## 2022-10-01 MED FILL — Immune Globulin (Human) IV Soln 5 GM/50ML: INTRAVENOUS | Qty: 50 | Status: AC

## 2022-10-01 NOTE — H&P (Incomplete)
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 She also reported numbness and tingling of the extremities. She was transferred to [facility] for MRI. 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  Albuminocytologic dissociation on LP under fluoro doen one 1/10.  She received IVIG for 2 g/kg daily divided over 5 days.  NIF's/VIC every 12 hours continue.  Foley catheter placed 1/10 and urine culture obtained positive for E. coli and Klebsiella pneumoniae.  She was treated with Rocephin for 7 days.  Foley catheter was discontinued, however she developed urinary retention and Foley catheter was placed on 1/15.  Home medications continued for chronic back pain/radiculopathy, hypertension, acid reflux, B12 deficiency, glaucoma.  She has had improvement in extremity weakness. Last NIF -40 and VC 2.2L performed on 1/14. 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   ROS Past Medical History:  Diagnosis Date   Anxiety    Back pain with radiation    Breast mass, right    Chronic pain    Chronic, continuous use of opioids    Complication of anesthesia 04/07/14   Allergic reaction to Lisinopril immediately following surgery   DJD (degenerative joint disease)    Fibromyalgia    Groin abscess    Headache(784.0)    History of IBS    Hypercholesterolemia    IBS (irritable bowel syndrome)    Lactose intolerance    Mild hypertension    Obesity    Tobacco use disorder    Umbilical hernia    Symptomatic   Past Surgical History:  Procedure Laterality Date   ABDOMINAL HYSTERECTOMY     ANTERIOR CERVICAL  DECOMP/DISCECTOMY FUSION N/A 12/20/2015   Procedure: ANTERIOR CERVICAL DECOMPRESSION FUSION CERVICAL 4-5, CERVICAL 5-6, CERVICAL 6-7 WITH INSTRUMENTATION AND ALLOGRAFT;  Surgeon: Phylliss Bob, MD;  Location: [place];  Service: Orthopedics;  Laterality: N/A;  Anterior cervical decompression fusion, cervical 4-5, cervical 5-6, cervical 6-7 with instrumentation and allograft   BACK SURGERY     BILATERAL SALPINGECTOMY  09/03/2012   Procedure: BILATERAL SALPINGECTOMY;  Surgeon: [name], MD;  Location: [facility];  Service: Gynecology;  Laterality: Bilateral;   BREAST BIOPSY Right 04/07/2014   Procedure: REMOVAL RIGHT BREAST MASS WITH WIRE LOCALIZATION;  Surgeon: [name], MD;  Location: [name];  Service: General;  Laterality: Right;   BREAST EXCISIONAL BIOPSY Left    x2   BREAST LUMPECTOMY     x2   CARPAL TUNNEL RELEASE Left 05/11/2019   Procedure: LEFT CARPAL TUNNEL RELEASE, RIGHT TENNIS ELBOW MARCAINE/DEPO MEDROL INJECTION UNDER ANESTHESIA;  Surgeon: Jessy Oto, MD;  Location: [place];  Service: Orthopedics;  Laterality: Left;   COLONOSCOPY W/ BIOPSIES  04/24/2012   per Dr. Carlean Purl, clear, repeat in 10 yrs    disectomy     ESOPHAGOGASTRODUODENOSCOPY     FINGER SURGERY     Right index-excision of mass    FOOT SURGERY Right    Bone Spurs   LAPAROSCOPIC HYSTERECTOMY  09/03/2012   Procedure: HYSTERECTOMY TOTAL LAPAROSCOPIC;  Surgeon: [name], MD;  Location: [facility];  Service: Gynecology;  Laterality: N/A;   LUMBAR DISC SURGERY  TUBAL LIGATION     UMBILICAL HERNIA REPAIR N/A 05/01/2018   Procedure: UMBILICAL HERNIA REPAIR;  Surgeon: Judeth Horn, MD;  Location: [place];  Service: General;  Laterality: N/A;   Family History  Problem Relation Age of Onset   Diabetes Father    Diabetes Mother    Prostate cancer Paternal Grandfather    Breast cancer Maternal Aunt 81   Social History:  reports that she has quit smoking. Her smoking use included cigarettes. She has never used  smokeless tobacco. She reports that she does not currently use alcohol. She reports current drug use. Allergies:  Allergies  Allergen Reactions   Lisinopril Anaphylaxis    was hospitalized for 3 days   Codeine Hives   Hydrocodone Hives   Penicillins Itching and Swelling    PATIENT HAS HAD A PCN REACTION WITH IMMEDIATE RASH, FACIAL/TONGUE/THROAT SWELLING, SOB, OR LIGHTHEADEDNESS WITH HYPOTENSION:  #  #  YES  #  #  Has patient had a PCN reaction causing severe rash involving mucus membranes or skin necrosis: No Has patient had a PCN reaction that required hospitalization No Has patient had a PCN reaction occurring within the last 10 years: No If all of the above answers are "NO", then may proceed with Cephalosporin use.    Amlodipine Swelling   Acetaminophen Rash   Medications Prior to Admission  Medication Sig Dispense Refill   albuterol (VENTOLIN HFA) 108 (90 Base) MCG/ACT inhaler Inhale 2 puffs into the lungs every 6 (six) hours as needed for wheezing or shortness of breath. 1 each 3   ALPRAZolam (XANAX) 0.5 MG tablet TAKE 1 TABLET (0.5 MG TOTAL) BY MOUTH 3 (THREE) TIMES DAILY AS NEEDED FOR ANXIETY. 90 tablet 5   benzonatate (TESSALON PERLES) 100 MG capsule 1-2 capsules up to twice daily as needed for cough 30 capsule 0   cyanocobalamin (VITAMIN B12) 1000 MCG/ML injection Inject 1 mL (1,000 mcg total) into the muscle once a week. (Patient taking differently: Inject 1,000 mcg into the muscle once a week. Thursday) 10 mL 11   cyclobenzaprine (FLEXERIL) 10 MG tablet TAKE 1 TABLET BY MOUTH EVERYDAY AT BEDTIME (Patient taking differently: Take 10 mg by mouth at bedtime.) 90 tablet 3   dorzolamide-timolol (COSOPT) 2-0.5 % ophthalmic solution 1 drop 2 (two) times daily.     famotidine (PEPCID) 20 MG tablet Take 1 tablet (20 mg total) by mouth 2 (two) times daily. 60 tablet 5   fluticasone (FLONASE) 50 MCG/ACT nasal spray SPRAY 2 SPRAYS INTO EACH NOSTRIL EVERY DAY (Patient taking differently:  Place 2 sprays into both nostrils daily as needed for allergies.) 16 mL 11   folic acid (FOLVITE) 1 MG tablet Take 1 tablet (1 mg total) by mouth daily. 90 tablet 3   furosemide (LASIX) 40 MG tablet Take 1 tablet (40 mg total) by mouth in the morning and at bedtime. 180 tablet 3   gabapentin (NEURONTIN) 300 MG capsule Take 1 capsule (300 mg total) by mouth 3 (three) times daily. 90 capsule 5   HYDROmorphone (DILAUDID) 4 MG tablet Take 1 tablet (4 mg total) by mouth every 6 (six) hours as needed for severe pain. 120 tablet 0   latanoprost (XALATAN) 0.005 % ophthalmic solution 1 drop at bedtime.     metoprolol tartrate (LOPRESSOR) 50 MG tablet Take 1 tablet (50 mg total) by mouth 2 (two) times daily. 180 tablet 3   pantoprazole (PROTONIX) 40 MG tablet Take 40 mg by mouth daily as needed (for acid reflux).  potassium chloride (KLOR-CON 10) 10 MEQ tablet Take 1 tablet (10 mEq total) by mouth 2 (two) times daily. 180 tablet 3   promethazine (PHENERGAN) 25 MG tablet Take 1 tablet (25 mg total) by mouth every 4 (four) hours as needed for nausea or vomiting. 60 tablet 5   SYRINGE-NEEDLE, DISP, 3 ML (LUER LOCK SAFETY SYRINGES) 25G X 1" 3 ML MISC 1 Application by Does not apply route once a week. 50 each 5   temazepam (RESTORIL) 30 MG capsule Take 1 capsule (30 mg total) by mouth at bedtime as needed for sleep. (Patient not taking: Reported on 09/23/2022) 30 capsule 5      Home: Home Living Family/patient expects to be discharged to:: Private residence Living Arrangements: Spouse/significant other Available Help at Discharge: Family, Available 24 hours/day (4 adult children and spouse) Type of Home: House Home Access: Ramped entrance [place]: One level Bathroom Shower/Tub: Chiropodist: Standard Bathroom Accessibility: Yes Home Equipment: Rollator (4 wheels)  Lives With: Spouse   Functional History: Prior Function Prior Level of Function : Independent/Modified  Independent, Driving Mobility Comments: Progressive weakness since early December but prior to that was completely independent with AD.  Did not work prior due to on disability.  At baseline uses cane outside and rollator in home. ADLs Comments: Reports independent adls and iadls, likes to go shopping with sister  Functional Status:  Mobility: Bed Mobility Overal bed mobility: Needs Assistance Bed Mobility: Supine to Sit Rolling: Min assist Supine to sit: Min assist, HOB elevated Sit to supine: Mod assist (A to lift BLE back onto bed) General bed mobility comments: up in the recliner Transfers Overall transfer level: Needs assistance Equipment used: Ambulation equipment used, Rolling walker (2 wheels) Transfers: Sit to/from Stand Sit to Stand: Mod assist Bed to/from chair/wheelchair/BSC transfer type:: Via Lift equipment Transfer via Lift Equipment: [place] transfer comment: Mod assist for boost to stand from bed and BSC with Stedy, followed by  mod assist si<>stand transfer from recliner using RW. Cues for technique. Pt leans forward excessively until cued, but seems to becoming better aware of this and making attempts to correct. Stedy used to transfer. Min assist to rise from [facility] for balance. Very anxious standing with RW, seemingly unaware of adequate strength in LEs, perhaps proprioceptive deficits causing this amount of fear with RW use. Ambulation/Gait Pre-gait activities: Stood with [place] and progressed with RW. Focusing on weight shift, upright posture, able to clear feet from floor with weight shift on Stedy, no overt buckling with this device, but noted with RW support only.    ADL: ADL Overall ADL's : Needs assistance/impaired Eating/Feeding: Set up, Supervision/ safety Eating/Feeding Details (indicate cue type and reason): with use of red tubing, attempted use of fork without foam.  Increased spillage and dropping of untensils. Grooming: Set up, Supervision/safety,  Sitting Grooming Details (indicate cue type and reason): reports she was able to brush teeth sitting on edge of stretcher earlier Upper Body Bathing: Minimal assistance Lower Body Bathing: Moderate assistance, Bed level Lower Body Bathing Details (indicate cue type and reason): assist for peri care in standing with Stedy. pt likely able to assist with upper LE and partial lower LE seated Upper Body Dressing : Sitting, Set up Lower Body Dressing: Maximal assistance, Bed level Toileting- Clothing Manipulation and Hygiene: Total assistance (foley) General ADL Comments: will benefit form use of AE for LB ADL  Cognition: Cognition Overall Cognitive Status: Within Functional Limits for tasks assessed Orientation Level: Oriented X4,  Disoriented to time Cognition Arousal/Alertness: Awake/alert Behavior During Therapy: WFL for tasks assessed/performed Overall Cognitive Status: Within Functional Limits for tasks assessed General Comments: continued anxiety with mobility attempts but responds well to encouragement  Physical Exam: Blood pressure 138/84, pulse 85, temperature 98.1 F (36.7 C), temperature source Oral, resp. rate 18, height '5\' 4"'$  (1.626 m), weight 126.9 kg, last menstrual period 08/17/2012, SpO2 97 %. Physical Exam Constitutional:      General: She is not in acute distress.    Appearance: She is obese.  HENT:     Head: Normocephalic and atraumatic.  Skin:    General: Skin is warm and dry.  Neurological:     Mental Status: She is alert and oriented to person, place, and time.  Psychiatric:        Mood and Affect: Mood normal.        Behavior: Behavior normal.     Results for orders placed or performed during the hospital encounter of 09/23/22 (from the past 48 hour(s))  Basic metabolic panel     Status: Abnormal   Collection Time: 10/01/22  3:41 AM  Result Value Ref Range   Sodium 134 (L) 135 - 145 mmol/L   Potassium 4.5 3.5 - 5.1 mmol/L   Chloride 100 98 - 111 mmol/L    CO2 26 22 - 32 mmol/L   Glucose, Bld 109 (H) 70 - 99 mg/dL    Comment: Glucose reference range applies only to samples taken after fasting for at least 8 hours.   BUN 11 6 - 20 mg/dL   Creatinine, Ser 0.75 0.44 - 1.00 mg/dL   Calcium 8.8 (L) 8.9 - 10.3 mg/dL   GFR, Estimated >60 >60 mL/min    Comment: (NOTE) Calculated using the CKD-EPI Creatinine Equation (2021)    Anion gap 8 5 - 15    Comment: Performed at [facility] [address] [place], [place] [address]   No results found.    Blood pressure 138/84, pulse 85, temperature 98.1 F (36.7 C), temperature source Oral, resp. rate 18, height '5\' 4"'$  (1.626 m), weight 126.9 kg, last menstrual period 08/17/2012, SpO2 97 %.  Medical Problem List and Plan: 1. Functional deficits secondary to ***  -patient may *** shower  -ELOS/Goals: *** 2.  Antithrombotics: -DVT/anticoagulation:  Pharmaceutical: Lovenox start 1/17  -antiplatelet therapy: none  3. Pain Management: Tylenol as needed  -see #  4. Mood/Behavior/Sleep: LCSW to evaluate and provide emotional support  -antipsychotic agents: n/a  5. Neuropsych/cognition: This patient is capable of making decisions on her own behalf.  6. Skin/Wound Care: Routine skin care checks  7. Fluids/Electrolytes/Nutrition: Routine Is and Os and follow-up chemistries  -protein supplements for hypoalbuminemia  -continue folic acid, K09  -continue MVI  8: Chronic back pain/radiculopathy:  -continue Dilaudid 4 mg q 6 hours (PTA per Dr. [name])  -continue gabapentin 600 mg TID  -continue Lidoderm patch daily  -continue Flexeril 10 mg TID prn  9: Urinary retention: Foley replaced on 1/15  -treated for UTI with Rocephin for  7 days  10: Hypertension: monitor TID and prn  -continue Lasix 40 mg daily  -continue Lopressor 50 mg BID  11: GERD?/GI prophylaxis: continue Protonix  12: B12 deficiency: continue supplementation  13: Class 3 obesity: BMI = 48; weight loss  counseling  14: Hyponatremia, mild: follow-up BMP  15: Elevated serum creatinine; 0.75 1/16 to 1.06 on 1/17  -follow-up BMP  16: GBS: continue to monitor NIFs/VIC q 12 hours  -  continue gabapentin  17: Glaucoma: continue latanoprost eye drops  18: Tobacco use: cessation counseling ***  Barbie Banner, PA-C 10/01/2022

## 2022-10-01 NOTE — Progress Notes (Signed)
Inpatient Rehabilitation Admissions Coordinator   I have received insurance approval and should have a CIR bed in the next 24 hrs. I met with patient at bedside and she is aware. Acute team and TOC made aware.  Danne Baxter, RN, MSN Rehab Admissions Coordinator [phone] 10/01/2022 3:00 PM

## 2022-10-01 NOTE — Progress Notes (Signed)
Inpatient Rehabilitation Admissions Coordinator    I contacted [employer] to expedite the determination for a possible CIR admit.  Danne Baxter, RN, MSN Rehab Admissions Coordinator [phone] 10/01/2022 10:22 AM\

## 2022-10-01 NOTE — Progress Notes (Signed)
Mobility Specialist Progress Note   10/01/22 0945  Mobility  Activity Stood at bedside; Assisted to bathroom;Transferred from bed to chair  Level of Assistance Minimal assist, patient does 75% or more  Assistive Device [employer]/Exercises Active;All extremities  Activity Response Tolerated well   Patient received in supine, irritable and agitated with nursing staff. Requested assistance to restroom for BM, stating she called a long time ago. Acceptable to writers assistance and agreeable to participate after BM. Progressing well with mobility requiring less physical assistance with standing. Required minimal HHA for bed mobility and stood with min A on Stedy device. Transferred to and from bathroom via Stedy device. Pericare was provided by NT with TA. Completed STSx3 total and performed oral/hygiene care at sink with minimal assistance. Was able to stand from [facility] independently requiring VC to push through LE's and stand with upright posture. Tolerated without complaint or incident. Was left in recliner chair with all needs met, call bell in reach.   [place] Naksh Radi, BS EXP Mobility Specialist Please contact via SecureChat or Rehab office at [phone]

## 2022-10-01 NOTE — Progress Notes (Signed)
Physical Therapy Treatment Patient Details Name: Dana [name] MRN: [id number] DOB: Jan 15, 1966 Today's Date: 10/01/2022   History of Present Illness Pt is a 57 y/o female who presented with progressive weakness and sensation changes, consistent with Guillain-Barr syndrome. MRI negative. LP results and assessment most consistent with Guillain-Barr syndrome.  PMH includes anxiety, DJD, fibromyalgia, HTN, tobacco use.    PT Comments    Patient demonstrates additional improvements with functional mobility. Mod assist approaching min assist for sit<>stand transfers from recliner with use of RW. Progressed with pre-gait activities and eventually managed to take several steps forward and backwards for 2 bouts today at a Mod A level for balance and walker control. Pt erratic with RW but feel this is induced by anxiety. Able to stabilize well with proper cues for anterior weight shift onto RW and appropriate placement for adequate support. Standing upright much better today. Eager to go to rehab. Will continue to follow and progress.    Recommendations for follow up therapy are one component of a multi-disciplinary discharge planning process, led by the attending physician.  Recommendations may be updated based on patient status, additional functional criteria and insurance authorization.  Follow Up Recommendations  Acute inpatient rehab (3hours/day)     Assistance Recommended at Discharge Frequent or constant Supervision/Assistance  Patient can return home with the following A lot of help with bathing/dressing/bathroom;Assist for transportation;Help with stairs or ramp for entrance;Assistance with cooking/housework;Two people to help with walking and/or transfers   Equipment Recommendations  Other (comment) (TBD)    Recommendations for Other Services Rehab consult     Precautions / Restrictions Precautions Precautions: Fall Restrictions Weight Bearing Restrictions: No     Mobility  Bed  Mobility               General bed mobility comments: In recliner    Transfers Overall transfer level: Needs assistance Equipment used: Rolling walker (2 wheels) Transfers: Sit to/from Stand Sit to Stand: Mod assist           General transfer comment: Mod assist, nearly min for boost to stand from recliner today. Cues for hand placement. Initially with erratic control for RW, seemingly more anxiety invoked than physical however shows posterior instability, bracing knees on chair. Cues for forward lean onto RW while maintaining upright posture. Greater stabiity noted.    Ambulation/Gait Ambulation/Gait assistance: Mod assist Gait Distance (Feet): 3 Feet (x2) Assistive device: Rolling walker (2 wheels) Gait Pattern/deviations: Step-to pattern, Decreased stride length, Shuffle, Ataxic, Wide base of support Gait velocity: slow Gait velocity interpretation: <1.31 ft/sec, indicative of household ambulator Pre-gait activities: Standing weight shift, upright posture (first day she has stood upright without excessive forward flexion.) stationary march with fair foot clearance. Very anxious. General Gait Details: Following pre-gait activity, pt tolerated advancing foot foward and taking steps. Demonstrates erratic control of RW needing mod assist for balance and placement of AD. Performed several steps backwards towards chair with additional assist and cues for sequencing, poor control of RW, overshooting placement frequently. Patient able to complete 2 bouts of forward and backwards with chair being pulled along by therapist.   Stairs             Wheelchair Mobility    Modified Rankin (Stroke Patients Only)       Balance Overall balance assessment: Needs assistance Sitting-balance support: Bilateral upper extremity supported, Feet supported Sitting balance-Leahy Scale: Fair     Standing balance support: Bilateral upper extremity supported, During functional  activity Standing balance-Leahy Scale:  Poor Standing balance comment: reliant on BUE support                            Cognition Arousal/Alertness: Awake/alert Behavior During Therapy: WFL for tasks assessed/performed Overall Cognitive Status: Within Functional Limits for tasks assessed                                 General Comments: Anxious in standing        Exercises      General Comments General comments (skin integrity, edema, etc.): MD to room for evaluation on behalf of rehab team.      Pertinent Vitals/Pain Pain Assessment Pain Assessment: Faces Faces Pain Scale: Hurts a little bit Pain Location: Back. BIL feet tingling, LLE numbness. Pain Descriptors / Indicators: Aching, Tightness, Tingling, Tiring Pain Intervention(s): Monitored during session    Home Living                          Prior Function            PT Goals (current goals can now be found in the care plan section) Acute Rehab PT Goals Patient Stated Goal: regain her strength PT Goal Formulation: With patient Time For Goal Achievement: 10/08/22 Potential to Achieve Goals: Good Progress towards PT goals: Progressing toward goals    Frequency    Min 4X/week      PT Plan Current plan remains appropriate    Co-evaluation              AM-PAC PT "6 Clicks" Mobility   Outcome Measure  Help needed turning from your back to your side while in a flat bed without using bedrails?: A Little Help needed moving from lying on your back to sitting on the side of a flat bed without using bedrails?: A Little Help needed moving to and from a bed to a chair (including a wheelchair)?: A Lot Help needed standing up from a chair using your arms (e.g., wheelchair or bedside chair)?: A Lot Help needed to walk in hospital room?: Total Help needed climbing 3-5 steps with a railing? : Total 6 Click Score: 12    End of Session Equipment Utilized During Treatment:  Gait belt Activity Tolerance: Patient tolerated treatment well Patient left: in chair;with call bell/phone within reach;Other (comment) (Physiatrist in room to perform evaluation) Nurse Communication: Mobility status;Need for lift equipment PT Visit Diagnosis: Other abnormalities of gait and mobility (R26.89);Muscle weakness (generalized) (M62.81);Other symptoms and signs involving the nervous system (R29.898)     Time: 5945-8592 PT Time Calculation (min) (ACUTE ONLY): 18 min  Charges:  $Therapeutic Activity: 8-22 mins                     Candie Mile, PT, DPT Physical Therapist Acute Rehabilitation Services [place]    Ellouise Newer 10/01/2022, 2:09 PM

## 2022-10-01 NOTE — Discharge Summary (Signed)
Physician Discharge Summary   [name] MRN: [id number] DOB: Mar 13, 1966  Admit date:     09/23/2022  Discharge date: 10/02/22  Discharge Physician: Elmarie Shiley   PCP: [name], MD   Recommendations at discharge:    Patient to be transfer to [facility] 1/17 Monitor Electrolytes.  Follow up with neurology Out patient.  Needs Voiding trial.   Discharge Diagnoses: Principal Problem:   Guillain Barr syndrome ([place]) Active Problems:   Obesity   Low back pain with sciatica   HTN (hypertension)   B12 deficiency   Debility   Hypokalemia   UTI (urinary tract infection)   Hypomagnesemia  Resolved Problems:   * No resolved hospital problems. *  Hospital Course: 57 year old with past medical history significant for B12 deficiency on B12 shots, anxiety, back pain, right breast mass, chronic opioid use, DJD, fibromyalgia, headache, hypercholesterolemia, mild hypertension, obesity and tobacco use who presented to the ED on Monday afternoon for evaluation of progressive limb weakness since December 8, followed by acute worsening on Thursday, weakness is ascending and involve legs and arms. Neurology has been consulted and concern for GB syndrome. Failed attempt for LP by IR 1/9, underwent LP 1/10.  She was treated with IVIG, she responded to treatment.  She completed treatment.  Plan to transfer to CIR 1/17.   Assessment and Plan: 1-Ascending progressive weakness:  Neurology assisted with patient care.  LP results and assessment most consistent with Ethelene Hal syndrome. NIF -40 Underwent LP 1/10: CSF fluid: WBC 3, RBC 17,750, total protein 155, glucose 70. Started on IV Immune- globuline. 1/10 for 5 days. Completed IVIG improvement on LE weakness.  Plan to transfer to CIR for further rehabilitation. On Gabapentin for lower extremity numbness and tingling    2-Essential hypertension: Relates headaches with hydralazine.  Continue with metoprolol; Lasix.     Obesity: Needs Lifestyle modification   Hypokalemia: Replaced.    UTI: Urine retention, Could be related to neurology process. Foley catheter placed 1/10. UTI was not related to Foley catheter Treated  with ceftriaxone for 7 days.  Urine culture:  Growing E. coli and Klebsiella pneumoniae Fail voiding trial 1/13. Foley catheter placed.  She will need voiding trial   B12 deficiency: Gets B12 shots as outpatient. Started  oral.    Hypomagnesemia: Replaced.  Right arm heaviness; after magnesium infusion. Monitor. resolved          Consultants: Neurology  Procedures performed: LP Disposition:  CIR Diet recommendation:  Cardiac diet DISCHARGE MEDICATION: Allergies as of 10/01/2022       Reactions   Lisinopril Anaphylaxis   was hospitalized for 3 days   Codeine Hives   Hydrocodone Hives   Penicillins Itching, Swelling   PATIENT HAS HAD A PCN REACTION WITH IMMEDIATE RASH, FACIAL/TONGUE/THROAT SWELLING, SOB, OR LIGHTHEADEDNESS WITH HYPOTENSION:  #  #  YES  #  #  Has patient had a PCN reaction causing severe rash involving mucus membranes or skin necrosis: No Has patient had a PCN reaction that required hospitalization No Has patient had a PCN reaction occurring within the last 10 years: No If all of the above answers are "NO", then may proceed with Cephalosporin use.   Amlodipine Swelling   Acetaminophen Rash        Medication List     STOP taking these medications    cyclobenzaprine 10 MG tablet Commonly known as: FLEXERIL   potassium chloride 10 MEQ tablet Commonly known as: Klor-Con 10   promethazine  25 MG tablet Commonly known as: PHENERGAN   temazepam 30 MG capsule Commonly known as: RESTORIL       TAKE these medications    albuterol 108 (90 Base) MCG/ACT inhaler Commonly known as: VENTOLIN HFA Inhale 2 puffs into the lungs every 6 (six) hours as needed for wheezing or shortness of breath.   ALPRAZolam 0.5 MG tablet Commonly known as:  XANAX TAKE 1 TABLET (0.5 MG TOTAL) BY MOUTH 3 (THREE) TIMES DAILY AS NEEDED FOR ANXIETY.   benzonatate 100 MG capsule Commonly known as: Tessalon Perles 1-2 capsules up to twice daily as needed for cough   cyanocobalamin 1000 MCG/ML injection Commonly known as: VITAMIN B12 Inject 1 mL (1,000 mcg total) into the muscle once a week. What changed: additional instructions   dorzolamide-timolol 2-0.5 % ophthalmic solution Commonly known as: COSOPT 1 drop 2 (two) times daily.   famotidine 20 MG tablet Commonly known as: Pepcid Take 1 tablet (20 mg total) by mouth 2 (two) times daily.   fluticasone 50 MCG/ACT nasal spray Commonly known as: FLONASE SPRAY 2 SPRAYS INTO EACH NOSTRIL EVERY DAY What changed: See the new instructions.   folic acid 1 MG tablet Commonly known as: FOLVITE Take 1 tablet (1 mg total) by mouth daily.   furosemide 40 MG tablet Commonly known as: LASIX Take 1 tablet (40 mg total) by mouth in the morning and at bedtime.   gabapentin 300 MG capsule Commonly known as: Neurontin Take 2 capsules (600 mg total) by mouth 3 (three) times daily. What changed: how much to take   HYDROmorphone 4 MG tablet Commonly known as: Dilaudid Take 1 tablet (4 mg total) by mouth every 6 (six) hours as needed for severe pain.   latanoprost 0.005 % ophthalmic solution Commonly known as: XALATAN 1 drop at bedtime.   Luer Lock Safety Syringes 25G X 1" 3 ML Misc Generic drug: SYRINGE-NEEDLE (DISP) 3 ML 1 Application by Does not apply route once a week.   metoprolol tartrate 50 MG tablet Commonly known as: LOPRESSOR Take 1 tablet (50 mg total) by mouth 2 (two) times daily.   multivitamin with minerals Tabs tablet Take 1 tablet by mouth daily. Start taking on: October 02, 2022   pantoprazole 40 MG tablet Commonly known as: PROTONIX Take 40 mg by mouth daily as needed (for acid reflux).        Discharge Exam: Filed Weights   09/25/22 1952 09/26/22 2103 09/28/22 2100   Weight: 126.9 kg 128.4 kg 126.9 kg   General; NAD  Condition at discharge: stable  The results of significant diagnostics from this hospitalization (including imaging, microbiology, ancillary and laboratory) are listed below for reference.   Imaging Studies: DG FL GUIDED LUMBAR PUNCTURE  Result Date: 09/25/2022 CLINICAL DATA:  Guillain-Barre. Unsuccessful lumbar puncture yesterday. EXAM: DIAGNOSTIC LUMBAR PUNCTURE UNDER FLUOROSCOPIC GUIDANCE COMPARISON:  Lumbar MRI 09/23/2022 FLUOROSCOPY: Radiation Exposure Index (as provided by the fluoroscopic device): 19.2 mGy Kerma PROCEDURE: Informed consent was obtained from the patient prior to the procedure, including potential complications of headache, allergy, and pain. With the patient prone, the lower back was prepped with Betadine. 1% Lidocaine was used for local anesthesia. Lumbar puncture initially attempted through a laminectomy defect on the left L2-3 but this was unsuccessful. Lumbar puncture was performed at the L5-S1 level using a 6 inch, a 20 gauge needle with return of blood tinged CSF CSF with very low pressure. The CSF began to clear but then flow CSF stopped. 1/2 ml of CSF  were obtained for laboratory studies. The patient tolerated the procedure well and there were no apparent complications. IMPRESSION: Very difficult lumbar puncture with severe spinal stenosis multiple levels. Lumbar puncture successful at L5-S1 but only 1/2 mL of blood tinged CSF obtained for laboratory studies. Electronically Signed   By: Franchot Gallo M.D.   On: 09/25/2022 14:59   DG FL GUIDED LUMBAR PUNCTURE  Result Date: 09/24/2022 CLINICAL DATA:  57 year old female. Presented to the ED with progressive ascending weakness. Request is for lumbar puncture for further evaluation EXAM: DIAGNOSTIC LUMBAR PUNCTURE UNDER FLUOROSCOPIC GUIDANCE COMPARISON:  None Available. FLUOROSCOPY: Radiation Exposure Index (as provided by the fluoroscopic device): 514.20 mGy Kerma  PROCEDURE: Informed consent was obtained from the patient prior to the procedure, including potential complications of headache, allergy, and pain. With the patient prone, the lower back was prepped with Betadine. 1% Lidocaine was used for local anesthesia. Lumbar puncture was several times by Rushie Nyhan NP and Dr. Kalman Jewels performed at the L4-L5 and L5-S1 level using a 20 gauge needle 6 in/. The multiple attempts were unsuccessful and no fluid was obtained. The patient tolerated the procedure well and there were no apparent complications. IMPRESSION: Unsuccessful lumbar puncture Electronically Signed   By: Marijo Sanes M.D.   On: 09/24/2022 15:48   MR Lumbar Spine W Wo Contrast  Result Date: 09/23/2022 CLINICAL DATA:  Initial evaluation for ataxia, weakness. EXAM: MRI LUMBAR SPINE WITHOUT AND WITH CONTRAST TECHNIQUE: Multiplanar and multiecho pulse sequences of the lumbar spine were obtained without and with intravenous contrast. CONTRAST:  58m GADAVIST GADOBUTROL 1 MMOL/ML IV SOLN COMPARISON:  Prior study from 04/16/2018. FINDINGS: Segmentation:  Examination moderately degraded by motion artifact. Standard segmentation. Lowest well-formed disc space labeled the L5-S1 level. Alignment: Physiologic with preservation of the normal lumbar lordosis. No significant listhesis. Vertebrae: Vertebral body height maintained without acute or chronic fracture. Bone marrow signal intensity diffusely heterogeneous without worrisome osseous lesion. Discogenic reactive endplate changes present about the L1-2 and L2-3 interspaces. No other abnormal marrow edema or enhancement. Conus medullaris and cauda equina: Conus extends to the L1 level. Conus and cauda equina appear normal. Paraspinal and other soft tissues: Paraspinous soft tissues demonstrate no acute finding. Asymmetric atrophy involving the left psoas muscle noted. Disc levels: L1-2: Disc bulge with disc desiccation. Superimposed left subarticular disc  extrusion with inferior migration (series 22, image 11). Mild facet hypertrophy. Mild epidural lipomatosis. Resultant moderate canal with severe left lateral recess stenosis. Moderate left L1 foraminal narrowing. Right neural foramen remains patent. L2-3: Advanced degenerative intervertebral disc space narrowing with disc desiccation and diffuse disc bulge. Associated reactive endplate spurring. Prior left hemi laminectomy. Moderate facet hypertrophy. Mild epidural lipomatosis. Residual mild canal with bilateral subarticular stenosis. Mild-to-moderate bilateral foraminal narrowing. L3-4: Mild diffuse disc bulge with disc desiccation. Central annular fissure. Moderate bilateral facet and ligament flavum hypertrophy with associated small joint effusions. Epidural lipomatosis. Resultant moderate spinal stenosis. Mild to moderate bilateral L3 foraminal narrowing. L4-5: Mild disc bulge with disc desiccation. Moderate bilateral facet arthrosis with small joint effusions. Epidural lipomatosis. Secondary compression of the distal thecal sac with moderate to severe spinal stenosis. Foramina remain patent. L5-S1: Disc desiccation with mild disc bulge. Mild bilateral facet hypertrophy. Epidural lipomatosis. No significant spinal stenosis. Foramina remain patent. IMPRESSION: 1. Left subarticular disc extrusion with inferior migration at L1-2 with resultant moderate canal and severe left lateral recess stenosis, with moderate left L1 foraminal narrowing. 2. Multifactorial degenerative changes at L3-4 with resultant moderate spinal stenosis, with  mild to moderate bilateral L3 foraminal narrowing. 3. Epidural lipomatosis with resultant moderate to severe spinal stenosis at L4-5. Electronically Signed   By: Jeannine Boga M.D.   On: 09/23/2022 22:59   MR THORACIC SPINE W WO CONTRAST  Result Date: 09/23/2022 CLINICAL DATA:  Initial evaluation for ataxia, weakness. EXAM: MRI THORACIC WITHOUT AND WITH CONTRAST TECHNIQUE:  Multiplanar and multiecho pulse sequences of the thoracic spine were obtained without and with intravenous contrast. CONTRAST:  16m GADAVIST GADOBUTROL 1 MMOL/ML IV SOLN COMPARISON:  None Available. FINDINGS: Alignment:  Examination moderately degraded by motion artifact. Vertebral bodies normally aligned with preservation of the normal thoracic kyphosis. No listhesis. Vertebrae: Vertebral body height maintained without acute or chronic fracture. Bone marrow signal intensity diffusely heterogeneous without worrisome osseous lesion. Scattered discogenic reactive endplate changes present throughout the thoracic spine. No other abnormal marrow edema or enhancement. Cord:  Normal signal and morphology.  No abnormal enhancement. Paraspinal and other soft tissues: Unremarkable. Disc levels: Moderate degenerative spondylosis with multilevel degenerative disc bulging and reactive endplate spurring present throughout the thoracic spine. Changes are most pronounced within the midthoracic spine at T5-6 through T10-11. No high-grade spinal stenosis by CT. Moderate bilateral foraminal narrowing present at T10-11. Foramina remain otherwise grossly patent. IMPRESSION: 1. Normal MRI appearance of the thoracic spinal cord. No cord compression or evidence for myelopathy. 2. Moderate degenerative spondylosis throughout the thoracic spine without high-grade spinal stenosis. Moderate bilateral foraminal narrowing at T10-11. Electronically Signed   By: BJeannine BogaM.D.   On: 09/23/2022 22:43   MR Cervical Spine W or Wo Contrast  Result Date: 09/23/2022 CLINICAL DATA:  Initial evaluation for ataxia, weakness. EXAM: MRI CERVICAL SPINE WITHOUT AND WITH CONTRAST TECHNIQUE: Multiplanar and multiecho pulse sequences of the cervical spine, to include the craniocervical junction and cervicothoracic junction, were obtained without and with intravenous contrast. CONTRAST:  180mGADAVIST GADOBUTROL 1 MMOL/ML IV SOLN COMPARISON:  Prior  MRI report from 07/15/2016. FINDINGS: Alignment: Examination moderately degraded by motion artifact, limiting assessment. Straightening of the normal cervical lordosis.  No listhesis. Vertebrae: Prior ACDF at C4-C7. Vertebral body height maintained without acute or chronic fracture. Bone marrow signal intensity overall within normal limits. No worrisome osseous lesions. Discogenic reactive endplate changes noted about the T1-2 interspace. No other abnormal marrow edema. Cord: Normal signal and morphology.  No abnormal enhancement. Posterior Fossa, vertebral arteries, paraspinal tissues: Unremarkable. Disc levels: C2-C3: Minimal disc bulge with bilateral uncovertebral spurring. No significant spinal stenosis. Foramina remain patent. C3-C4: Mild disc bulge with bilateral uncovertebral spurring. No significant spinal stenosis. Foramina remain patent. C4-C5: Prior fusion. No residual spinal stenosis. Foramina remain patent. C5-C6: Prior fusion. No residual spinal stenosis. Bilateral uncovertebral spurring with residual moderate left with mild-to-moderate right C6 foraminal stenosis. C6-C7: Prior fusion. No residual spinal stenosis. Bilateral uncovertebral spurring with residual mild to moderate bilateral C7 foraminal stenosis. C7-T1: Degenerative intervertebral disc space narrowing with mild disc bulge. Right greater than left uncovertebral spurring. No significant spinal stenosis. Mild right C8 foraminal stenosis. Left neural foramina remains patent. IMPRESSION: 1. Normal MRI appearance of the cervical spinal cord. No cord compression or evidence for myelopathy. 2. Prior ACDF at C4-C7 without residual spinal stenosis. 3. Mild to moderate bilateral C6 and C7 and right C8 foraminal stenosis related to uncovertebral disease. Electronically Signed   By: BeJeannine Boga.D.   On: 09/23/2022 22:32   CT Head Wo Contrast  Result Date: 09/23/2022 CLINICAL DATA:  Headache EXAM: CT HEAD WITHOUT CONTRAST TECHNIQUE:  Contiguous  axial images were obtained from the base of the skull through the vertex without intravenous contrast. RADIATION DOSE REDUCTION: This exam was performed according to the departmental dose-optimization program which includes automated exposure control, adjustment of the mA and/or kV according to patient size and/or use of iterative reconstruction technique. COMPARISON:  None Available. FINDINGS: Brain: No evidence of acute infarction, hemorrhage, hydrocephalus, extra-axial collection or mass lesion/mass effect. Partially empty sella. Vascular: No hyperdense vessel or unexpected calcification. Skull: Normal. Negative for fracture or focal lesion. Sinuses/Orbits: No acute finding. Other: None. IMPRESSION: No specific etiology for headaches identified. Electronically Signed   By: Marin Roberts M.D.   On: 09/23/2022 17:01   DG Chest Portable 1 View  Result Date: 09/23/2022 CLINICAL DATA:  Numbness all over.  Weakness. EXAM: PORTABLE CHEST 1 VIEW COMPARISON:  04/23/2018. FINDINGS: Normal heart, mediastinum and hila. Clear lungs.  No pleural effusion or pneumothorax. Skeletal structures are grossly intact. Stable changes from a previous anterior cervical spine fusion. IMPRESSION: No active disease. Electronically Signed   By: Lajean Manes M.D.   On: 09/23/2022 13:22    Microbiology: Results for orders placed or performed during the hospital encounter of 09/23/22  Resp panel by RT-PCR (RSV, Flu A&B, Covid) Anterior Nasal Swab     Status: None   Collection Time: 09/23/22  1:31 PM   Specimen: Anterior Nasal Swab  Result Value Ref Range Status   SARS Coronavirus 2 by RT PCR NEGATIVE NEGATIVE Final    Comment: (NOTE) SARS-CoV-2 target nucleic acids are NOT DETECTED.  The SARS-CoV-2 RNA is generally detectable in upper respiratory specimens during the acute phase of infection. The lowest concentration of SARS-CoV-2 viral copies this assay can detect is 138 copies/mL. A negative result does not  preclude SARS-Cov-2 infection and should not be used as the sole basis for treatment or other patient management decisions. A negative result may occur with  improper specimen collection/handling, submission of specimen other than nasopharyngeal swab, presence of viral mutation(s) within the areas targeted by this assay, and inadequate number of viral copies(<138 copies/mL). A negative result must be combined with clinical observations, patient history, and epidemiological information. The expected result is Negative.  Fact Sheet for Patients:  EntrepreneurPulse.com.au  Fact Sheet for Healthcare Providers:  IncredibleEmployment.be  This test is no t yet approved or cleared by the [place] FDA and  has been authorized for detection and/or diagnosis of SARS-CoV-2 by FDA under an Emergency Use Authorization (EUA). This EUA will remain  in effect (meaning this test can be used) for the duration of the COVID-19 declaration under Section 564(b)(1) of the Act, 21 U.S.C.section 360bbb-3(b)(1), unless the authorization is terminated  or revoked sooner.       Influenza A by PCR NEGATIVE NEGATIVE Final   Influenza B by PCR NEGATIVE NEGATIVE Final    Comment: (NOTE) The Xpert Xpress SARS-CoV-2/FLU/RSV plus assay is intended as an aid in the diagnosis of influenza from Nasopharyngeal swab specimens and should not be used as a sole basis for treatment. Nasal washings and aspirates are unacceptable for Xpert Xpress SARS-CoV-2/FLU/RSV testing.  Fact Sheet for Patients: EntrepreneurPulse.com.au  Fact Sheet for Healthcare Providers: IncredibleEmployment.be  This test is not yet approved or cleared by the [place] FDA and has been authorized for detection and/or diagnosis of SARS-CoV-2 by FDA under an Emergency Use Authorization (EUA). This EUA will remain in effect (meaning this test can be used) for the  duration of the COVID-19 declaration under Section 564(b)(1) of the  Act, 21 U.S.C. section 360bbb-3(b)(1), unless the authorization is terminated or revoked.     Resp Syncytial Virus by PCR NEGATIVE NEGATIVE Final    Comment: (NOTE) Fact Sheet for Patients: EntrepreneurPulse.com.au  Fact Sheet for Healthcare Providers: IncredibleEmployment.be  This test is not yet approved or cleared by the [place] FDA and has been authorized for detection and/or diagnosis of SARS-CoV-2 by FDA under an Emergency Use Authorization (EUA). This EUA will remain in effect (meaning this test can be used) for the duration of the COVID-19 declaration under Section 564(b)(1) of the Act, 21 U.S.C. section 360bbb-3(b)(1), unless the authorization is terminated or revoked.  Performed at [facility], [place] [address] [place], [place] [address]   Urine Culture     Status: Abnormal   Collection Time: 09/24/22  1:39 AM   Specimen: Urine, Clean Catch  Result Value Ref Range Status   Specimen Description URINE, CLEAN CATCH  Final   Special Requests   Final    NONE Performed at [facility], [place] [address], [place], [place] [address]    Culture (A)  Final    >=100,000 COLONIES/mL ESCHERICHIA COLI 80,000 COLONIES/mL KLEBSIELLA PNEUMONIAE    Report Status 09/26/2022 FINAL  Final   Organism ID, Bacteria ESCHERICHIA COLI (A)  Final   Organism ID, Bacteria KLEBSIELLA PNEUMONIAE (A)  Final      Susceptibility   Escherichia coli - MIC*    AMPICILLIN <=2 SENSITIVE Sensitive     CEFAZOLIN <=4 SENSITIVE Sensitive     CEFEPIME <=0.12 SENSITIVE Sensitive     CEFTRIAXONE <=0.25 SENSITIVE Sensitive     CIPROFLOXACIN <=0.25 SENSITIVE Sensitive     GENTAMICIN <=1 SENSITIVE Sensitive     IMIPENEM <=0.25 SENSITIVE Sensitive     NITROFURANTOIN <=16 SENSITIVE Sensitive     TRIMETH/SULFA <=20 SENSITIVE Sensitive     AMPICILLIN/SULBACTAM <=2 SENSITIVE  Sensitive     PIP/TAZO <=4 SENSITIVE Sensitive     * >=100,000 COLONIES/mL ESCHERICHIA COLI   Klebsiella pneumoniae - MIC*    AMPICILLIN >=32 RESISTANT Resistant     CEFAZOLIN <=4 SENSITIVE Sensitive     CEFEPIME <=0.12 SENSITIVE Sensitive     CEFTRIAXONE <=0.25 SENSITIVE Sensitive     CIPROFLOXACIN <=0.25 SENSITIVE Sensitive     GENTAMICIN <=1 SENSITIVE Sensitive     IMIPENEM <=0.25 SENSITIVE Sensitive     NITROFURANTOIN 32 SENSITIVE Sensitive     TRIMETH/SULFA <=20 SENSITIVE Sensitive     AMPICILLIN/SULBACTAM 4 SENSITIVE Sensitive     PIP/TAZO <=4 SENSITIVE Sensitive     * 80,000 COLONIES/mL KLEBSIELLA PNEUMONIAE    Labs: CBC: Recent Labs  Lab 09/27/22 0628  WBC 5.3  HGB 11.1*  HCT 34.2*  MCV 92.4  PLT 053   Basic Metabolic Panel: Recent Labs  Lab 09/27/22 0628 09/29/22 0517 10/01/22 0341  NA 136 135 134*  K 4.1 4.2 4.5  CL 102 103 100  CO2 '24 25 26  '$ GLUCOSE 103* 129* 109*  BUN '7 8 11  '$ CREATININE 0.76 0.87 0.75  CALCIUM 8.3* 8.6* 8.8*  MG 1.7  --   --    Liver Function Tests: No results for input(s): "AST", "ALT", "ALKPHOS", "BILITOT", "PROT", "ALBUMIN" in the last 168 hours. CBG: No results for input(s): "GLUCAP" in the last 168 hours.  Discharge time spent: greater than 30 minutes.  Signed: Elmarie Shiley, MD [facility] 10/01/2022

## 2022-10-02 ENCOUNTER — Other Ambulatory Visit: Payer: Self-pay

## 2022-10-02 ENCOUNTER — Inpatient Hospital Stay (HOSPITAL_COMMUNITY)
Admission: RE | Admit: 2022-10-02 | Discharge: 2022-11-06 | DRG: 945 | Disposition: A | Discharge status: Home Health | Payer: 59 | Source: Intra-hospital | Attending: Physical Medicine and Rehabilitation | Admitting: Physical Medicine and Rehabilitation

## 2022-10-02 ENCOUNTER — Encounter (HOSPITAL_COMMUNITY): Payer: Self-pay | Admitting: Physical Medicine and Rehabilitation

## 2022-10-02 DIAGNOSIS — M797 Fibromyalgia: Secondary | ICD-10-CM | POA: Diagnosis present

## 2022-10-02 DIAGNOSIS — Z803 Family history of malignant neoplasm of breast: Secondary | ICD-10-CM | POA: Diagnosis not present

## 2022-10-02 DIAGNOSIS — I10 Essential (primary) hypertension: Secondary | ICD-10-CM | POA: Diagnosis present

## 2022-10-02 DIAGNOSIS — R1084 Generalized abdominal pain: Secondary | ICD-10-CM | POA: Diagnosis not present

## 2022-10-02 DIAGNOSIS — F411 Generalized anxiety disorder: Secondary | ICD-10-CM | POA: Diagnosis present

## 2022-10-02 DIAGNOSIS — E7800 Pure hypercholesterolemia, unspecified: Secondary | ICD-10-CM | POA: Diagnosis present

## 2022-10-02 DIAGNOSIS — G8929 Other chronic pain: Secondary | ICD-10-CM | POA: Diagnosis present

## 2022-10-02 DIAGNOSIS — E538 Deficiency of other specified B group vitamins: Secondary | ICD-10-CM | POA: Diagnosis present

## 2022-10-02 DIAGNOSIS — M5416 Radiculopathy, lumbar region: Secondary | ICD-10-CM | POA: Diagnosis present

## 2022-10-02 DIAGNOSIS — E739 Lactose intolerance, unspecified: Secondary | ICD-10-CM | POA: Diagnosis present

## 2022-10-02 DIAGNOSIS — N319 Neuromuscular dysfunction of bladder, unspecified: Secondary | ICD-10-CM | POA: Diagnosis present

## 2022-10-02 DIAGNOSIS — R5381 Other malaise: Secondary | ICD-10-CM | POA: Diagnosis present

## 2022-10-02 DIAGNOSIS — K5900 Constipation, unspecified: Secondary | ICD-10-CM | POA: Diagnosis present

## 2022-10-02 DIAGNOSIS — Z833 Family history of diabetes mellitus: Secondary | ICD-10-CM

## 2022-10-02 DIAGNOSIS — G610 Guillain-Barre syndrome: Secondary | ICD-10-CM | POA: Diagnosis not present

## 2022-10-02 DIAGNOSIS — Z87891 Personal history of nicotine dependence: Secondary | ICD-10-CM

## 2022-10-02 DIAGNOSIS — Z6841 Body Mass Index (BMI) 40.0 and over, adult: Secondary | ICD-10-CM

## 2022-10-02 DIAGNOSIS — Z90710 Acquired absence of both cervix and uterus: Secondary | ICD-10-CM | POA: Diagnosis not present

## 2022-10-02 DIAGNOSIS — E8809 Other disorders of plasma-protein metabolism, not elsewhere classified: Secondary | ICD-10-CM | POA: Diagnosis present

## 2022-10-02 DIAGNOSIS — Z79899 Other long term (current) drug therapy: Secondary | ICD-10-CM | POA: Diagnosis not present

## 2022-10-02 DIAGNOSIS — K5901 Slow transit constipation: Secondary | ICD-10-CM | POA: Diagnosis not present

## 2022-10-02 DIAGNOSIS — N179 Acute kidney failure, unspecified: Secondary | ICD-10-CM | POA: Diagnosis present

## 2022-10-02 DIAGNOSIS — K219 Gastro-esophageal reflux disease without esophagitis: Secondary | ICD-10-CM | POA: Diagnosis present

## 2022-10-02 DIAGNOSIS — I959 Hypotension, unspecified: Secondary | ICD-10-CM | POA: Diagnosis not present

## 2022-10-02 DIAGNOSIS — H409 Unspecified glaucoma: Secondary | ICD-10-CM | POA: Diagnosis present

## 2022-10-02 DIAGNOSIS — M545 Low back pain, unspecified: Secondary | ICD-10-CM | POA: Diagnosis not present

## 2022-10-02 DIAGNOSIS — G629 Polyneuropathy, unspecified: Secondary | ICD-10-CM | POA: Diagnosis present

## 2022-10-02 DIAGNOSIS — E875 Hyperkalemia: Secondary | ICD-10-CM | POA: Diagnosis present

## 2022-10-02 DIAGNOSIS — G4700 Insomnia, unspecified: Secondary | ICD-10-CM | POA: Diagnosis present

## 2022-10-02 DIAGNOSIS — E871 Hypo-osmolality and hyponatremia: Secondary | ICD-10-CM | POA: Diagnosis present

## 2022-10-02 LAB — COMPREHENSIVE METABOLIC PANEL
ALT: 33 U/L (ref 0–44)
AST: 39 U/L (ref 15–41)
Albumin: 2.8 g/dL — ABNORMAL LOW (ref 3.5–5.0)
Alkaline Phosphatase: 73 U/L (ref 38–126)
Anion gap: 8 (ref 5–15)
BUN: 16 mg/dL (ref 6–20)
CO2: 27 mmol/L (ref 22–32)
Calcium: 8.5 mg/dL — ABNORMAL LOW (ref 8.9–10.3)
Chloride: 98 mmol/L (ref 98–111)
Creatinine, Ser: 1.06 mg/dL — ABNORMAL HIGH (ref 0.44–1.00)
GFR, Estimated: >60 mL/min (ref >60)
Glucose, Bld: 110 mg/dL — ABNORMAL HIGH (ref 70–99)
Potassium: 4.3 mmol/L (ref 3.5–5.1)
Sodium: 133 mmol/L — ABNORMAL LOW (ref 135–145)
Total Bilirubin: 0.4 mg/dL (ref 0.3–1.2)
Total Protein: 8 g/dL (ref 6.5–8.1)

## 2022-10-02 LAB — PHOSPHORUS: Phosphorus: 5.5 mg/dL — ABNORMAL HIGH (ref 2.5–4.6)

## 2022-10-02 LAB — MAGNESIUM: Magnesium: 1.7 mg/dL (ref 1.7–2.4)

## 2022-10-02 MED ORDER — HYDROMORPHONE HCL 2 MG PO TABS
4.0000 mg | ORAL_TABLET | Freq: Four times a day (QID) | ORAL | Status: DC | PRN
  Administered 2022-10-02 – 2022-11-06 (×70): 4 mg via ORAL
  Filled 2022-10-02 (×71): qty 2

## 2022-10-02 MED ORDER — CAMPHOR-MENTHOL 0.5-0.5 % EX LOTN: TOPICAL_LOTION | CUTANEOUS | Status: DC | PRN

## 2022-10-02 MED ORDER — GABAPENTIN 300 MG PO CAPS
600.0000 mg | ORAL_CAPSULE | Freq: Three times a day (TID) | ORAL | Status: DC
  Administered 2022-10-02 – 2022-10-04 (×6): 600 mg via ORAL
  Filled 2022-10-02 (×6): qty 2

## 2022-10-02 MED ORDER — PANTOPRAZOLE SODIUM 40 MG PO TBEC
40.0000 mg | DELAYED_RELEASE_TABLET | Freq: Every day | ORAL | Status: DC
  Administered 2022-10-03 – 2022-11-06 (×35): 40 mg via ORAL
  Filled 2022-10-02 (×35): qty 1

## 2022-10-02 MED ORDER — FUROSEMIDE 40 MG PO TABS
40.0000 mg | ORAL_TABLET | Freq: Two times a day (BID) | ORAL | Status: DC
  Administered 2022-10-02 – 2022-10-14 (×24): 40 mg via ORAL
  Filled 2022-10-02 (×24): qty 1

## 2022-10-02 MED ORDER — CYCLOBENZAPRINE HCL 10 MG PO TABS
10.0000 mg | ORAL_TABLET | Freq: Two times a day (BID) | ORAL | Status: DC | PRN
  Administered 2022-10-02 – 2022-10-25 (×4): 10 mg via ORAL
  Filled 2022-10-02 (×5): qty 1

## 2022-10-02 MED ORDER — METHOCARBAMOL 500 MG PO TABS: 500.0000 mg | ORAL_TABLET | Freq: Four times a day (QID) | ORAL | Status: DC | PRN

## 2022-10-02 MED ORDER — PROCHLORPERAZINE EDISYLATE 10 MG/2ML IJ SOLN: 5.0000 mg | Freq: Four times a day (QID) | INTRAMUSCULAR | Status: DC | PRN

## 2022-10-02 MED ORDER — FLEET ENEMA 7-19 GM/118ML RE ENEM: 1.0000 | ENEMA | Freq: Once | RECTAL | Status: DC | PRN

## 2022-10-02 MED ORDER — SENNOSIDES-DOCUSATE SODIUM 8.6-50 MG PO TABS
1.0000 | ORAL_TABLET | Freq: Two times a day (BID) | ORAL | Status: DC
  Administered 2022-10-02 – 2022-11-05 (×64): 1 via ORAL
  Filled 2022-10-02 (×68): qty 1

## 2022-10-02 MED ORDER — SORBITOL 70 % SOLN: 30.0000 mL | Freq: Every day | Status: DC | PRN

## 2022-10-02 MED ORDER — LATANOPROST 0.005 % OP SOLN
1.0000 [drp] | Freq: Every day | OPHTHALMIC | Status: DC
  Administered 2022-10-02 – 2022-11-05 (×34): 1 [drp] via OPHTHALMIC
  Filled 2022-10-02 (×2): qty 2.5

## 2022-10-02 MED ORDER — PROCHLORPERAZINE MALEATE 5 MG PO TABS: 5.0000 mg | ORAL_TABLET | Freq: Four times a day (QID) | ORAL | Status: DC | PRN

## 2022-10-02 MED ORDER — METOPROLOL TARTRATE 50 MG PO TABS
50.0000 mg | ORAL_TABLET | Freq: Two times a day (BID) | ORAL | Status: DC
  Administered 2022-10-02 – 2022-11-06 (×69): 50 mg via ORAL
  Filled 2022-10-02 (×70): qty 1

## 2022-10-02 MED ORDER — CYCLOBENZAPRINE HCL 10 MG PO TABS: 10.0000 mg | ORAL_TABLET | Freq: Three times a day (TID) | ORAL | Status: DC | PRN

## 2022-10-02 MED ORDER — ADULT MULTIVITAMIN W/MINERALS CH
1.0000 | ORAL_TABLET | Freq: Every day | ORAL | Status: DC
  Administered 2022-10-03 – 2022-11-06 (×35): 1 via ORAL
  Filled 2022-10-02 (×36): qty 1

## 2022-10-02 MED ORDER — GUAIFENESIN-DM 100-10 MG/5ML PO SYRP: 5.0000 mL | ORAL_SOLUTION | Freq: Four times a day (QID) | ORAL | Status: DC | PRN

## 2022-10-02 MED ORDER — MAGNESIUM HYDROXIDE 400 MG/5ML PO SUSP: 30.0000 mL | Freq: Every day | ORAL | Status: DC | PRN

## 2022-10-02 MED ORDER — ALUM & MAG HYDROXIDE-SIMETH 200-200-20 MG/5ML PO SUSP
30.0000 mL | ORAL | Status: DC | PRN
  Administered 2022-10-24: 30 mL via ORAL
  Filled 2022-10-02: qty 30

## 2022-10-02 MED ORDER — TRAZODONE HCL 50 MG PO TABS
25.0000 mg | ORAL_TABLET | Freq: Every evening | ORAL | Status: DC | PRN
  Administered 2022-10-06 – 2022-10-09 (×4): 50 mg via ORAL
  Filled 2022-10-02 (×5): qty 1

## 2022-10-02 MED ORDER — DIPHENHYDRAMINE HCL 12.5 MG/5ML PO ELIX: 12.5000 mg | ORAL_SOLUTION | Freq: Four times a day (QID) | ORAL | Status: DC | PRN

## 2022-10-02 MED ORDER — PROCHLORPERAZINE 25 MG RE SUPP
12.5000 mg | Freq: Four times a day (QID) | RECTAL | Status: DC | PRN
  Filled 2022-10-02: qty 1

## 2022-10-02 MED ORDER — MAGNESIUM GLUCONATE 500 MG PO TABS
250.0000 mg | ORAL_TABLET | Freq: Every day | ORAL | Status: DC
  Administered 2022-10-02 – 2022-11-05 (×35): 250 mg via ORAL
  Filled 2022-10-02 (×35): qty 1

## 2022-10-02 MED ORDER — DORZOLAMIDE HCL-TIMOLOL MAL 2-0.5 % OP SOLN
1.0000 [drp] | Freq: Two times a day (BID) | OPHTHALMIC | Status: DC
  Administered 2022-10-02 – 2022-11-06 (×67): 1 [drp] via OPHTHALMIC
  Filled 2022-10-02: qty 10

## 2022-10-02 MED ORDER — ENOXAPARIN SODIUM 60 MG/0.6ML IJ SOSY
60.0000 mg | PREFILLED_SYRINGE | INTRAMUSCULAR | Status: DC
  Administered 2022-10-02 – 2022-11-05 (×35): 60 mg via SUBCUTANEOUS
  Filled 2022-10-02 (×35): qty 0.6

## 2022-10-02 MED ORDER — VITAMIN B-12 100 MCG PO TABS
100.0000 ug | ORAL_TABLET | Freq: Every day | ORAL | Status: DC
  Administered 2022-10-03 – 2022-11-06 (×35): 100 ug via ORAL
  Filled 2022-10-02 (×35): qty 1

## 2022-10-02 MED ORDER — CYCLOBENZAPRINE HCL 10 MG PO TABS
10.0000 mg | ORAL_TABLET | Freq: Every day | ORAL | Status: DC
  Administered 2022-10-02 – 2022-10-24 (×23): 10 mg via ORAL
  Filled 2022-10-02 (×23): qty 1

## 2022-10-02 MED ORDER — CYANOCOBALAMIN 1000 MCG/ML IJ SOLN
1000.0000 ug | INTRAMUSCULAR | Status: DC
  Filled 2022-10-02: qty 1

## 2022-10-02 MED ORDER — FOLIC ACID 1 MG PO TABS
1.0000 mg | ORAL_TABLET | Freq: Every day | ORAL | Status: DC
  Administered 2022-10-03 – 2022-11-06 (×35): 1 mg via ORAL
  Filled 2022-10-02 (×36): qty 1

## 2022-10-02 MED ORDER — LIDOCAINE 5 % EX PTCH
1.0000 | MEDICATED_PATCH | CUTANEOUS | Status: DC
  Administered 2022-10-03: 1 via TRANSDERMAL
  Filled 2022-10-02 (×8): qty 1

## 2022-10-02 NOTE — TOC Transition Note (Signed)
Transition of Care [facility]) - CM/SW Discharge Note   Patient Details  Name: Dana [name] MRN: [id number] Date of Birth: 07/06/1966  [facility] CM/SW Contact:  Tom-Johnson, Renea Ee, RN Phone Number: 10/02/2022, 9:59 AM   Clinical Narrative:     Patient is scheduled for discharge today to [facility]. No further TOC needs noted.       Final next level of care: IP Rehab Facility Barriers to Discharge: Barriers Resolved   Patient Goals and CMS Choice CMS Medicare.gov Compare Post Acute Care list provided to:: Patient Choice offered to / list presented to : Patient  Discharge Placement                  Patient to be transferred to facility by: In-hospital transfer via bed.- CIR      Discharge Plan and Services Additional resources added to the After Visit Summary for   In-house Referral: Chaplain Discharge Planning Services: CM Consult Post Acute Care Choice: IP Rehab                    HH Arranged: NA [place] Agency: NA        Social Determinants of Health (SDOH) Interventions SDOH Screenings   Food Insecurity: No Food Insecurity (05/06/2022)  Transportation Needs: No Transportation Needs (05/06/2022)  Depression (PHQ2-9): Low Risk  (09/05/2022)  Financial Resource Strain: Low Risk  (05/06/2022)  Physical Activity: Inactive (05/06/2022)  Stress: No Stress Concern Present (05/06/2022)  Tobacco Use: Medium Risk (09/23/2022)     Readmission Risk Interventions     No data to display

## 2022-10-02 NOTE — Progress Notes (Addendum)
Patient alert and orientedx 4, son at bedside. Discussed general therapy with patient and what to expect on Rehab. Informed patient has been using stedy for transfers on previous unit. Patient was upset close to end of shift stating she has not had her pain medication. Informed her that it is PRN and she would have to let us know when she is having pain, same with Flexeril as it is PRN. She stated understanding. Gave medication per request. Skin check completed with [facility] RN. Patient's skin noted to have some moisture and skin breakdown in vaginal folds, applied antifungal powder to affected areas. Patient had bandaid to lower back from lumbar puncture, area clean dry and intact. Patient has a rash to mid back area that is red and states it is itching. It is rectangular in shape. Lidocaine patch noted below it, she states she did have a patch there once as well. Informed MD Raulker, states she will place order for Sarna cream for itching/redness.

## 2022-10-02 NOTE — H&P (Signed)
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 She also reported numbness and tingling of the extremities. She was transferred to [facility] for MRI. 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  Albuminocytologic dissociation on LP under fluoro doen one 1/10.  She received IVIG for 2 g/kg daily divided over 5 days.  NIF's/VIC every 12 hours continue.  Foley catheter placed 1/10 and urine culture obtained positive for E. coli and Klebsiella pneumoniae.  She was treated with Rocephin for 7 days.  Foley catheter was discontinued, however she developed urinary retention and Foley catheter was placed on 1/15.  Home medications continued for chronic back pain/radiculopathy, hypertension, acid reflux, B12 deficiency, glaucoma.  She has had improvement in extremity weakness. Last NIF -40 and VC 2.2L performed on 1/14. 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   Review of Systems  Constitutional: Negative.   HENT: Negative.    Eyes: Negative.   Respiratory: Negative.    Cardiovascular: Negative.   Skin: Negative.   Neurological:  Positive for weakness.   Past Medical History:  Diagnosis Date   Anxiety    Back pain with radiation    Breast mass, right    Chronic pain    Chronic, continuous use of opioids    Complication of anesthesia 04/07/14   Allergic reaction to Lisinopril immediately following surgery   DJD (degenerative joint disease)    Fibromyalgia    Groin abscess    Headache(784.0)    History of IBS    Hypercholesterolemia    IBS (irritable bowel syndrome)    Lactose intolerance     Mild hypertension    Obesity    Tobacco use disorder    Umbilical hernia    Symptomatic   Past Surgical History:  Procedure Laterality Date   ABDOMINAL HYSTERECTOMY     ANTERIOR CERVICAL DECOMP/DISCECTOMY FUSION N/A 12/20/2015   Procedure: ANTERIOR CERVICAL DECOMPRESSION FUSION CERVICAL 4-5, CERVICAL 5-6, CERVICAL 6-7 WITH INSTRUMENTATION AND ALLOGRAFT;  Surgeon: Phylliss Bob, MD;  Location: [place];  Service: Orthopedics;  Laterality: N/A;  Anterior cervical decompression fusion, cervical 4-5, cervical 5-6, cervical 6-7 with instrumentation and allograft   BACK SURGERY     BILATERAL SALPINGECTOMY  09/03/2012   Procedure: BILATERAL SALPINGECTOMY;  Surgeon: [name], MD;  Location: [facility];  Service: Gynecology;  Laterality: Bilateral;   BREAST BIOPSY Right 04/07/2014   Procedure: REMOVAL RIGHT BREAST MASS WITH WIRE LOCALIZATION;  Surgeon: [name], MD;  Location: [facility];  Service: General;  Laterality: Right;   BREAST EXCISIONAL BIOPSY Left    x2   BREAST LUMPECTOMY     x2   CARPAL TUNNEL RELEASE Left 05/11/2019   Procedure: LEFT CARPAL TUNNEL RELEASE, RIGHT TENNIS ELBOW MARCAINE/DEPO MEDROL INJECTION UNDER ANESTHESIA;  Surgeon: Jessy Oto, MD;  Location: [place];  Service: Orthopedics;  Laterality: Left;   COLONOSCOPY W/ BIOPSIES  04/24/2012   per Dr. Carlean Purl, clear, repeat in 10 yrs    disectomy     ESOPHAGOGASTRODUODENOSCOPY     FINGER SURGERY     Right index-excision of mass    FOOT SURGERY Right    Bone Spurs  LAPAROSCOPIC HYSTERECTOMY  09/03/2012   Procedure: HYSTERECTOMY TOTAL LAPAROSCOPIC;  Surgeon: [name], MD;  Location: [facility];  Service: Gynecology;  Laterality: N/A;   LUMBAR DISC SURGERY     TUBAL LIGATION     UMBILICAL HERNIA REPAIR N/A 05/01/2018   Procedure: UMBILICAL HERNIA REPAIR;  Surgeon: Judeth Horn, MD;  Location: [place];  Service: General;  Laterality: N/A;   Family History  Problem Relation Age of Onset   Diabetes Father     Diabetes Mother    Prostate cancer Paternal Grandfather    Breast cancer Maternal Aunt 46   Social History:  reports that she has quit smoking. Her smoking use included cigarettes. She has never used smokeless tobacco. She reports that she does not currently use alcohol. She reports current drug use. Allergies:  Allergies  Allergen Reactions   Lisinopril Anaphylaxis    was hospitalized for 3 days   Codeine Hives   Hydrocodone Hives   Penicillins Itching and Swelling    PATIENT HAS HAD A PCN REACTION WITH IMMEDIATE RASH, FACIAL/TONGUE/THROAT SWELLING, SOB, OR LIGHTHEADEDNESS WITH HYPOTENSION:  #  #  YES  #  #  Has patient had a PCN reaction causing severe rash involving mucus membranes or skin necrosis: No Has patient had a PCN reaction that required hospitalization No Has patient had a PCN reaction occurring within the last 10 years: No If all of the above answers are "NO", then may proceed with Cephalosporin use.    Amlodipine Swelling   Acetaminophen Rash   Medications Prior to Admission  Medication Sig Dispense Refill   albuterol (VENTOLIN HFA) 108 (90 Base) MCG/ACT inhaler Inhale 2 puffs into the lungs every 6 (six) hours as needed for wheezing or shortness of breath. 1 each 3   ALPRAZolam (XANAX) 0.5 MG tablet TAKE 1 TABLET (0.5 MG TOTAL) BY MOUTH 3 (THREE) TIMES DAILY AS NEEDED FOR ANXIETY. 90 tablet 5   benzonatate (TESSALON PERLES) 100 MG capsule 1-2 capsules up to twice daily as needed for cough 30 capsule 0   cyanocobalamin (VITAMIN B12) 1000 MCG/ML injection Inject 1 mL (1,000 mcg total) into the muscle once a week. (Patient taking differently: Inject 1,000 mcg into the muscle once a week. Thursday) 10 mL 11   dorzolamide-timolol (COSOPT) 2-0.5 % ophthalmic solution 1 drop 2 (two) times daily.     famotidine (PEPCID) 20 MG tablet Take 1 tablet (20 mg total) by mouth 2 (two) times daily. 60 tablet 5   fluticasone (FLONASE) 50 MCG/ACT nasal spray SPRAY 2 SPRAYS INTO EACH  NOSTRIL EVERY DAY (Patient taking differently: Place 2 sprays into both nostrils daily as needed for allergies.) 16 mL 11   folic acid (FOLVITE) 1 MG tablet Take 1 tablet (1 mg total) by mouth daily. 90 tablet 3   furosemide (LASIX) 40 MG tablet Take 1 tablet (40 mg total) by mouth in the morning and at bedtime. 180 tablet 3   gabapentin (NEURONTIN) 300 MG capsule Take 2 capsules (600 mg total) by mouth 3 (three) times daily. 30 capsule 0   HYDROmorphone (DILAUDID) 4 MG tablet Take 1 tablet (4 mg total) by mouth every 6 (six) hours as needed for severe pain. 120 tablet 0   latanoprost (XALATAN) 0.005 % ophthalmic solution 1 drop at bedtime.     metoprolol tartrate (LOPRESSOR) 50 MG tablet Take 1 tablet (50 mg total) by mouth 2 (two) times daily. 180 tablet 3   Multiple Vitamin (MULTIVITAMIN WITH MINERALS) TABS tablet Take 1  tablet by mouth daily. 30 tablet 0   pantoprazole (PROTONIX) 40 MG tablet Take 40 mg by mouth daily as needed (for acid reflux).     SYRINGE-NEEDLE, DISP, 3 ML (LUER LOCK SAFETY SYRINGES) 25G X 1" 3 ML MISC 1 Application by Does not apply route once a week. 16 each 5   Home: Home Living Family/patient expects to be discharged to:: Private residence Living Arrangements: Spouse/significant other Available Help at Discharge: Family, Available 24 hours/day (4 adult children and spouse) Type of Home: House Home Access: Ramped entrance Home Layout: One level Bathroom Shower/Tub: Chiropodist: Standard Bathroom Accessibility: Yes Home Equipment: Rollator (4 wheels)  Lives With: Spouse   Functional History: Prior Function Prior Level of Function : Independent/Modified Independent, Driving Mobility Comments: Progressive weakness since early December but prior to that was completely independent with AD.  Did not work prior due to on disability.  At baseline uses cane outside and rollator in home. ADLs Comments: Reports independent adls and iadls, likes to go  shopping with sister   Functional Status:  Mobility: Bed Mobility Overal bed mobility: Needs Assistance Bed Mobility: Supine to Sit Rolling: Min assist Supine to sit: Min assist, HOB elevated Sit to supine: Mod assist (A to lift BLE back onto bed) General bed mobility comments: up in the recliner Transfers Overall transfer level: Needs assistance Equipment used: Ambulation equipment used, Rolling walker (2 wheels) Transfers: Sit to/from Stand Sit to Stand: Mod assist Bed to/from chair/wheelchair/BSC transfer type:: Via Lift equipment Transfer via Lift Equipment: [place] transfer comment: Mod assist for boost to stand from bed and BSC with Stedy, followed by  mod assist si<>stand transfer from recliner using RW. Cues for technique. Pt leans forward excessively until cued, but seems to becoming better aware of this and making attempts to correct. Stedy used to transfer. Min assist to rise from [facility] for balance. Very anxious standing with RW, seemingly unaware of adequate strength in LEs, perhaps proprioceptive deficits causing this amount of fear with RW use. Ambulation/Gait Pre-gait activities: Stood with [place] and progressed with RW. Focusing on weight shift, upright posture, able to clear feet from floor with weight shift on Stedy, no overt buckling with this device, but noted with RW support only.   ADL: ADL Overall ADL's : Needs assistance/impaired Eating/Feeding: Set up, Supervision/ safety Eating/Feeding Details (indicate cue type and reason): with use of red tubing, attempted use of fork without foam.  Increased spillage and dropping of untensils. Grooming: Set up, Supervision/safety, Sitting Grooming Details (indicate cue type and reason): reports she was able to brush teeth sitting on edge of stretcher earlier Upper Body Bathing: Minimal assistance Lower Body Bathing: Moderate assistance, Bed level Lower Body Bathing Details (indicate cue type and reason): assist for  peri care in standing with Stedy. pt likely able to assist with upper LE and partial lower LE seated Upper Body Dressing : Sitting, Set up Lower Body Dressing: Maximal assistance, Bed level Toileting- Clothing Manipulation and Hygiene: Total assistance (foley) General ADL Comments: will benefit form use of AE for LB ADL   Cognition: Cognition Overall Cognitive Status: Within Functional Limits for tasks assessed Orientation Level: Oriented X4, Disoriented to time Cognition Arousal/Alertness: Awake/alert Behavior During Therapy: WFL for tasks assessed/performed Overall Cognitive Status: Within Functional Limits for tasks assessed General Comments: continued anxiety with mobility attempts but responds well to encouragement    Physical Exam: Blood pressure (!) 155/84, pulse 86, temperature 97.7 F (36.5 C), temperature source Oral, resp. rate  19, height '5\' 4"'$  (1.626 m), weight 123 kg, last menstrual period 08/17/2012, SpO2 97 %. Physical Exam Constitutional:      General: She is not in acute distress.    Appearance: She is obese.  HENT:     Head: Normocephalic and atraumatic.  Skin:    General: Skin is warm and dry.  Neurological:     Mental Status: She is alert and oriented to person, place, and time.  Psychiatric:        Mood and Affect: Mood normal.        Behavior: Behavior normal.     Results for orders placed or performed during the hospital encounter of 09/23/22 (from the past 48 hour(s))  Basic metabolic panel     Status: Abnormal   Collection Time: 10/01/22  3:41 AM  Result Value Ref Range   Sodium 134 (L) 135 - 145 mmol/L   Potassium 4.5 3.5 - 5.1 mmol/L   Chloride 100 98 - 111 mmol/L   CO2 26 22 - 32 mmol/L   Glucose, Bld 109 (H) 70 - 99 mg/dL    Comment: Glucose reference range applies only to samples taken after fasting for at least 8 hours.   BUN 11 6 - 20 mg/dL   Creatinine, Ser 0.75 0.44 - 1.00 mg/dL   Calcium 8.8 (L) 8.9 - 10.3 mg/dL   GFR, Estimated >60  >60 mL/min    Comment: (NOTE) Calculated using the CKD-EPI Creatinine Equation (2021)    Anion gap 8 5 - 15    Comment: Performed at [facility] [address] [place], [place] [address]  Comprehensive metabolic panel     Status: Abnormal   Collection Time: 10/02/22 11:01 AM  Result Value Ref Range   Sodium 133 (L) 135 - 145 mmol/L   Potassium 4.3 3.5 - 5.1 mmol/L   Chloride 98 98 - 111 mmol/L   CO2 27 22 - 32 mmol/L   Glucose, Bld 110 (H) 70 - 99 mg/dL    Comment: Glucose reference range applies only to samples taken after fasting for at least 8 hours.   BUN 16 6 - 20 mg/dL   Creatinine, Ser 1.06 (H) 0.44 - 1.00 mg/dL   Calcium 8.5 (L) 8.9 - 10.3 mg/dL   Total Protein 8.0 6.5 - 8.1 g/dL   Albumin 2.8 (L) 3.5 - 5.0 g/dL   AST 39 15 - 41 U/L   ALT 33 0 - 44 U/L   Alkaline Phosphatase 73 38 - 126 U/L   Total Bilirubin 0.4 0.3 - 1.2 mg/dL   GFR, Estimated >60 >60 mL/min    Comment: (NOTE) Calculated using the CKD-EPI Creatinine Equation (2021)    Anion gap 8 5 - 15    Comment: Performed at [facility], [place] [address] [place], [place] [address]  Magnesium     Status: None   Collection Time: 10/02/22 11:01 AM  Result Value Ref Range   Magnesium 1.7 1.7 - 2.4 mg/dL    Comment: Performed at [facility] [address] [place], [place] [address]  Phosphorus     Status: Abnormal   Collection Time: 10/02/22 11:01 AM  Result Value Ref Range   Phosphorus 5.5 (H) 2.5 - 4.6 mg/dL    Comment: Performed at [facility] [address], [place], [place] [address]   No results found.    Blood pressure (!) 155/84, pulse 86, temperature 97.7 F (36.5 C), temperature source Oral,  resp. rate 19, height '5\' 4"'$  (1.626 m), weight 123 kg, last menstrual period 08/17/2012, SpO2 97 %.  Medical Problem List and Plan: 1. 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  Antithrombotics: -DVT/anticoagulation:   Pharmaceutical: Lovenox start 1/17  -antiplatelet therapy: none  3. Pain Management: continue Tylenol as needed  -see #  4. Mood/Behavior/Sleep: LCSW to evaluate and provide emotional support  -antipsychotic agents: n/a  5. Neuropsych/cognition: This patient is capable of making decisions on her own behalf.  6. Skin/Wound Care: Routine skin care checks  7. Fluids/Electrolytes/Nutrition: Routine Is and Os and follow-up chemistries  -protein supplements for hypoalbuminemia  -continue folic acid, Y10  -continue MVI  8: Chronic back pain/radiculopathy:  -continue Dilaudid 4 mg q 6 hours (PTA per Dr. [name])  -continue gabapentin 600 mg TID  -continue Lidoderm patch daily  -continue Flexeril 10 mg TID prn  9: Urinary retention: Foley replaced on 1/15  -treated for UTI with Rocephin for  7 days  10: Hypertension: monitor TID and prn  -continue Lasix 40 mg daily  -continue Lopressor 50 mg BID  -add magnesium gluconate '250mg'$  HS  11: GERD?/GI prophylaxis: continue Protonix  12: B12 deficiency: continue supplementation  13: Class 3 obesity: BMI = 48; weight loss counseling  14: Hyponatremia, mild: follow-up BMP  15: Elevated serum creatinine; 0.75 1/16 to 1.06 on 1/17  -follow-up BMP  16: GBS: continue to monitor NIFs/VIC q 12 hours  -continue gabapentin  17: Glaucoma: continue latanoprost eye drops  18: Tobacco use: cessation counseling  I have personally performed a face to face diagnostic evaluation, including, but not limited to relevant history and physical exam findings, of this patient and developed relevant assessment and plan.  Additionally, I have reviewed and concur with the physician assistant's documentation above.  Risa Grill, PA   Izora Ribas, MD 10/02/2022

## 2022-10-02 NOTE — Progress Notes (Signed)
Pt was able to obtain a NIF-40 of  and VC of 2.1L.

## 2022-10-02 NOTE — Progress Notes (Signed)
Izora Ribas, MD  Physician Physical Medicine and Rehabilitation   PMR Pre-admission    Signed   Date of Service: 09/27/2022  4:09 PM  Related encounter: ED to Hosp-Admission (Discharged) from 09/23/2022 in [place]      Show:Clear all '[x]'$ Written'[x]'$ Templated'[]'$ Copied  Added by: '[x]'$ Cristina Gong, RN'[x]'$ Raulkar, Clide Deutscher, MD  '[]'$ Hover for details PMR Admission Coordinator Pre-Admission Assessment   [name] is an 57 y.o., female MRN: [id number] DOB: [birth year] Height: '5\' 4"'$  (162.6 cm) Weight: 126.9 kg   Insurance Information HMO:     PPO:      PCP:      IPA:      80/20:      OTHER:  PRIMARY: [employer]      Policy#: [id number]      Subscriber: spouse CM Name: Aron Baba      Phone#: [phone]     Fax#: [phone] Pre-Cert#: [id number]  approved for 6 days with updates due to [place] phone [phone] ext [phone] fax [phone]    Employer:  Benefits:  Phone #: [phone]     Name: 1/12 Eff. Date: 09/16/22     Deduct: none      Out of Pocket Max: NONE CIR: 80%      SNF: 80% 60 days Outpatient: 80%     Co-Pay: 20 combined visits Home Health: 80%      Co-Pay: 60 combined visits DME: 80%     Co-Pay: 20% Providers: in network  SECONDARY: Medicare a and b      Policy#: [id number] Passport one source 1/12 a 11/14/2017 and b 03/17/2019   Financial Counselor:       Phone#:    The "Data Collection Information Summary" for patients in Inpatient Rehabilitation Facilities with attached "Privacy Act [place] Records" was provided and verbally reviewed with: Patient and Family   Emergency Contact Information Contact Information       Name Relation Home Work Mobile    [place] Jr.,William [place] Spouse Costilla Daughter [phone]             Current Medical History  Patient Admitting Diagnosis: GBS   History of Present Illness: 57 year old female with history  of IBS, fibromyalgia, HTN, HLD, chronic back pain, B12 deficiency, lumbar radiculopathy presented on 09/23/21 with progressive weakness and numbness since December 8.   Neurology consulted. Numbness of lips, neck and dropping objects. Underwent LP for concerns for Gilliam Barre syndrome. Started on IVIG for 5 days. Foley catheter placed. Urine few e coli and klebsiella pneumoniae and began Rocephin.    Complete NIHSS TOTAL: 0   Patient's medical record from [facility] has been reviewed by the rehabilitation admission coordinator and physician.   Past Medical History      Past Medical History:  Diagnosis Date   Anxiety     Back pain with radiation     Breast mass, right     Chronic pain     Chronic, continuous use of opioids     Complication of anesthesia 04/07/14    Allergic reaction to Lisinopril immediately following surgery   DJD (degenerative joint disease)     Fibromyalgia     Groin abscess     Headache(784.0)     History of IBS     Hypercholesterolemia     IBS (irritable bowel syndrome)     Lactose intolerance  Mild hypertension     Obesity     Tobacco use disorder     Umbilical hernia      Symptomatic    Has the patient had major surgery during 100 days prior to admission? No   Family History   family history includes Breast cancer (age of onset: 49) in her maternal aunt; Diabetes in her father and mother; Prostate cancer in her paternal grandfather.   Current Medications   Current Facility-Administered Medications:    albuterol (PROVENTIL) (2.5 MG/3ML) 0.083% nebulizer solution 3 mL, 3 mL, Inhalation, Q6H PRN, Doutova, Anastassia, MD   Chlorhexidine Gluconate Cloth 2 % PADS 6 each, 6 each, Topical, Daily, Regalado, Belkys A, MD, 6 each at 09/30/22 1523   cyclobenzaprine (FLEXERIL) tablet 10 mg, 10 mg, Oral, TID PRN, Roel Cluck, Anastassia, MD, 10 mg at 34/19/37 9024   folic acid (FOLVITE) tablet 1 mg, 1 mg, Oral, Daily, Doutova, Anastassia, MD, 1 mg at  10/02/22 0850   furosemide (LASIX) tablet 40 mg, 40 mg, Oral, BID, Regalado, Belkys A, MD, 40 mg at 10/02/22 0850   gabapentin (NEURONTIN) capsule 600 mg, 600 mg, Oral, TID, Lovey Newcomer C, NP, 600 mg at 10/02/22 0850   HYDROmorphone (DILAUDID) tablet 4 mg, 4 mg, Oral, Q6H PRN, Doutova, Anastassia, MD, 4 mg at 10/01/22 2120   latanoprost (XALATAN) 0.005 % ophthalmic solution 1 drop, 1 drop, Both Eyes, QHS, Doutova, Anastassia, MD, 1 drop at 10/01/22 2121   lidocaine (LIDODERM) 5 % 1 patch, 1 patch, Transdermal, Q24H, Regalado, Belkys A, MD, 1 patch at 10/01/22 1321   metoprolol tartrate (LOPRESSOR) tablet 50 mg, 50 mg, Oral, BID, Regalado, Belkys A, MD, 50 mg at 10/02/22 0850   multivitamin with minerals tablet 1 tablet, 1 tablet, Oral, Daily, Regalado, Belkys A, MD, 1 tablet at 10/02/22 0850   pantoprazole (PROTONIX) EC tablet 40 mg, 40 mg, Oral, Daily, Doutova, Anastassia, MD, 40 mg at 10/02/22 0849   senna-docusate (Senokot-S) tablet 1 tablet, 1 tablet, Oral, BID, Regalado, Belkys A, MD, 1 tablet at 10/02/22 0849   vitamin B-12 (CYANOCOBALAMIN) tablet 100 mcg, 100 mcg, Oral, Daily, Regalado, Belkys A, MD, 100 mcg at 10/01/22 1047   Patients Current Diet:  Diet Order                  Diet Heart Room service appropriate? Yes; Fluid consistency: Thin  Diet effective now                       Precautions / Restrictions Precautions Precautions: Fall Restrictions Weight Bearing Restrictions: No    Has the patient had 2 or more falls or a fall with injury in the past year? No   Prior Activity Level Limited Community (1-2x/wk): Mod I with rollator in home and cane in community; drives   Prior Functional Level Self Care: Did the patient need help bathing, dressing, using the toilet or eating? Independent   Indoor Mobility: Did the patient need assistance with walking from room to room (with or without device)? Independent   Stairs: Did the patient need assistance with internal or  external stairs (with or without device)? Independent   Functional Cognition: Did the patient need help planning regular tasks such as shopping or remembering to take medications? Independent   Patient Information Are you of Hispanic, Latino/a,or Spanish origin?: A. No, not of Hispanic, Latino/a, or Spanish origin What is your race?: B. Black or African American Do you need or want an interpreter  to communicate with a doctor or health care staff?: 0. No   Patient's Response To:  Health Literacy and Transportation Is the patient able to respond to health literacy and transportation needs?: Yes Health Literacy - How often do you need to have someone help you when you read instructions, pamphlets, or other written material from your doctor or pharmacy?: Never In the past 12 months, has lack of transportation kept you from medical appointments or from getting medications?: No In the past 12 months, has lack of transportation kept you from meetings, work, or from getting things needed for daily living?: No   Home Assistive Devices / [place] Devices/Equipment: Environmental consultant (specify type) Home Equipment: Rollator (4 wheels)   Prior Device Use: Indicate devices/aids used by the patient prior to current illness, exacerbation or injury?  Rollator in home and can [employer]   Current Functional Level Cognition   Overall Cognitive Status: Within Functional Limits for tasks assessed Orientation Level: Oriented X4, Disoriented to time General Comments: Anxious in standing    Extremity Assessment (includes Sensation/Coordination)   Upper Extremity Assessment: RUE deficits/detail, LUE deficits/detail RUE Deficits / Details: sensory motor impairment affected fine motor coordination however functional; does bes when using visual feedback to improve movement patters RUE Coordination: decreased fine motor, decreased gross motor LUE Deficits / Details: similar to R LUE Coordination:  decreased fine motor, decreased gross motor  Lower Extremity Assessment: Defer to PT evaluation RLE Deficits / Details: ROM WFL; MMT: hip 3/5, ankle and knee 4-/5; Sensation: Pt initially reports diminished and then reports numb.  When tested could feel some on thigh but diminished and no other sensation.  Reports buttock and genitals also numb.  No prioprioception RLE Sensation: decreased light touch LLE Deficits / Details: ROM WFL; MMT: hip 1/5, ankle and knee 3/5; Sensation: Pt initially reports diminished and then reports numb. When tested could feel some on thigh but diminished and no other sensation. Reports buttock and genitals also numb. No prioprioception LLE Sensation: decreased light touch     ADLs   Overall ADL's : Needs assistance/impaired Eating/Feeding: Set up, Supervision/ safety Eating/Feeding Details (indicate cue type and reason): with use of red tubing, attempted use of fork without foam.  Increased spillage and dropping of untensils. Grooming: Set up, Supervision/safety, Sitting Grooming Details (indicate cue type and reason): reports she was able to brush teeth sitting on edge of stretcher earlier Upper Body Bathing: Minimal assistance Lower Body Bathing: Moderate assistance, Bed level Lower Body Bathing Details (indicate cue type and reason): assist for peri care in standing with Stedy. pt likely able to assist with upper LE and partial lower LE seated Upper Body Dressing : Sitting, Set up Lower Body Dressing: Maximal assistance, Bed level Toileting- Clothing Manipulation and Hygiene: Total assistance (foley) General ADL Comments: will benefit form use of AE for LB ADL     Mobility   Overal bed mobility: Needs Assistance Bed Mobility: Supine to Sit Rolling: Min assist Supine to sit: Min assist, HOB elevated Sit to supine: Mod assist (A to lift BLE back onto bed) General bed mobility comments: In recliner     Transfers   Overall transfer level: Needs  assistance Equipment used: Rolling walker (2 wheels) Transfers: Sit to/from Stand Sit to Stand: Mod assist Bed to/from chair/wheelchair/BSC transfer type:: Via Lift equipment Transfer via Lift Equipment: [place] transfer comment: Mod assist, nearly min for boost to stand from recliner today. Cues for hand placement. Initially with erratic control for RW,  seemingly more anxiety invoked than physical however shows posterior instability, bracing knees on chair. Cues for forward lean onto RW while maintaining upright posture. Greater stabiity noted.     Ambulation / Gait / Stairs / Wheelchair Mobility   Ambulation/Gait Ambulation/Gait assistance: Mod assist Gait Distance (Feet): 3 Feet (x2) Assistive device: Rolling walker (2 wheels) Gait Pattern/deviations: Step-to pattern, Decreased stride length, Shuffle, Ataxic, Wide base of support General Gait Details: Following pre-gait activity, pt tolerated advancing foot foward and taking steps. Demonstrates erratic control of RW needing mod assist for balance and placement of AD. Performed several steps backwards towards chair with additional assist and cues for sequencing, poor control of RW, overshooting placement frequently. Patient able to complete 2 bouts of forward and backwards with chair being pulled along by therapist. Gait velocity: slow Gait velocity interpretation: <1.31 ft/sec, indicative of household ambulator Pre-gait activities: Standing weight shift, upright posture (first day she has stood upright without excessive forward flexion.) stationary march with fair foot clearance. Very anxious.     Posture / Balance Balance Overall balance assessment: Needs assistance Sitting-balance support: Bilateral upper extremity supported, Feet supported Sitting balance-Leahy Scale: Fair Standing balance support: Bilateral upper extremity supported, During functional activity Standing balance-Leahy Scale: Poor Standing balance comment: reliant  on BUE support     Special needs/care consideration Fall precautions    Previous Home Environment  Living Arrangements: Spouse/significant other  Lives With: Spouse Available Help at Discharge: Family, Available 24 hours/day (4 adult children and spouse) Type of Home: House Home Layout: One level Home Access: Ramped entrance Bathroom Shower/Tub: Chiropodist: Standard Bathroom Accessibility: Yes How Accessible: Accessible via walker [place]: No   Discharge Living Setting Plans for Discharge Living Setting: Patient's home, Lives with (comment) (spouse) Type of Home at Discharge: House Discharge Home Layout: One level Discharge Home Access: [place] entrance Discharge Bathroom Shower/Tub: [place] unit Discharge Bathroom Toilet: Standard Discharge Bathroom Accessibility: Yes How Accessible: Accessible via walker Does the patient have any problems obtaining your medications?: No   Social/Family/Support Systems Patient Roles: Spouse, Parent Contact Information: spouse, Gwyndolyn Saxon Anticipated Caregiver: spouse and 4 adult children Anticipated Caregiver's Contact Information: see contacts Ability/Limitations of Caregiver: none Caregiver Availability: 24/7 Discharge Plan Discussed with Primary Caregiver: Yes Is Caregiver In Agreement with Plan?: Yes Does Caregiver/Family have Issues with Lodging/Transportation while Pt is in Rehab?: No   Goals Patient/Family Goal for Rehab: supervision to min asisst with PT and OT Expected length of stay: ELOS 10 to 14 days Pt/Family Agrees to Admission and willing to participate: Yes Program Orientation Provided & Reviewed with Pt/Caregiver Including Roles  & Responsibilities: Yes   Decrease burden of Care through IP rehab admission: n/a   Possible need for SNF placement upon discharge: not anticipated   Patient Condition: I have reviewed medical records from [facility], spoken with  patient and spouse. I  met with patient at the bedside for inpatient rehabilitation assessment.  Patient will benefit from ongoing PT and OT, can actively participate in 3 hours of therapy a day 5 days of the week, and can make measurable gains during the admission.  Patient will also benefit from the coordinated team approach during an Inpatient Acute Rehabilitation admission.  The patient will receive intensive therapy as well as Rehabilitation physician, nursing, social worker, and care management interventions.  Due to bladder management, bowel management, safety, skin/wound care, disease management, medication administration, pain management, and patient education the patient requires 24 hour a day rehabilitation nursing.  The patient is currently mod assist overall with mobility and basic ADLs.  Discharge setting and therapy post discharge at home with home health is anticipated.  Patient has agreed to participate in the Acute Inpatient Rehabilitation Program and will admit today.   Preadmission Screen Completed By:  Cleatrice Burke, 10/02/2022 9:59 AM ______________________________________________________________________   Discussed status with Dr. Ranell Patrick on 10/02/22 at [phone] and received approval for admission today.   Admission Coordinator: Dorita Sciara, RN, time 2130 Date 10/02/22    Assessment/Plan: Diagnosis: Does the need for close, 24 hr/day Medical supervision in concert with the patient's rehab needs make it unreasonable for this patient to be served in a less intensive setting? Yes Co-Morbidities requiring supervision/potential complications: IBS, fibromyalgia, HTN, HLD, chronic back pain, B12 deficiency Due to bladder management, bowel management, safety, skin/wound care, disease management, medication administration, pain management, and patient education, does the patient require 24 hr/day rehab nursing? Yes Does the patient require coordinated care of a physician, rehab nurse, PT,  OT to address physical and functional deficits in the context of the above medical diagnosis(es)? Yes Addressing deficits in the following areas: balance, endurance, locomotion, strength, transferring, bowel/bladder control, bathing, dressing, feeding, grooming, toileting, and psychosocial support Can the patient actively participate in an intensive therapy program of at least 3 hrs of therapy 5 days a week? Yes The potential for patient to make measurable gains while on inpatient rehab is excellent Anticipated functional outcomes upon discharge from inpatient rehab: modified independent PT, modified independent OT, independent SLP Estimated rehab length of stay to reach the above functional goals is: 10-14 days Anticipated discharge destination: Home 10. Overall Rehab/Functional Prognosis: excellent     MD Signature: Leeroy Cha, MD         Revision History

## 2022-10-02 NOTE — Progress Notes (Signed)
Inpatient Rehabilitation Admissions Coordinator    I have CIR bed available to admit patient today. I have alerted acute team and TOC and will make the arrangements to admit.  Danne Baxter, RN, MSN Rehab Admissions Coordinator [phone] 10/02/2022 9:55 AM

## 2022-10-02 NOTE — Progress Notes (Addendum)
Inpatient Rehabilitation Admission Medication Review by a Pharmacist  A complete drug regimen review was completed for this patient to identify any potential clinically significant medication issues.  High Risk Drug Classes Is patient taking? Indication by Medication  Antipsychotic Yes PRN prochlorperazine - n/v  Anticoagulant Yes Enoxaparin - VTE prophylaxis  Antibiotic No   Opioid Yes Hydromorphone PO - pain  Antiplatelet No   Hypoglycemics/insulin No   Vasoactive Medication Yes Metoprolol, furosemide - blood pressure  Chemotherapy No   Other Yes Latanoprost - glaucoma Lidocaine patch - pain Pantoprazole - GI prophylaxis MVI, folate, B-12 oral - supplements Senokot S - laxative  PRNs: Maalox - indigestion Cyclobenzaprine - muscle spasms Methocarbamol - muscle spasms Diphenhydramine - itching Guaifenesin DM - cough Trazodone - sleep MOM, Sorbitiol, Fleets enema - laxatives     Type of Medication Issue Identified Description of Issue Recommendation(s)  Drug Interaction(s) (clinically significant)     Duplicate Therapy  Both Cyclobenzaprine and Methocarbamol ordered for prn muscle spasms.  Reported using Cyclobenzaprine qhs PTA. Meds to be clarified, including which to use first if both to continue.  Allergy     No Medication Administration End Date     Incorrect Dose     Additional Drug Therapy Needed  PTA CoSopt eye drops had not been ordered while inpatient. Resume CoSopt eye drops.  Significant med changes from prior encounter (inform family/care partners about these prior to discharge). Gabapentin dose increased from 300 to 600 mg TID on 09/28/22.  Scheduled KCl discontinued.   Monitor K+ while on Furosemide and resume KCl if needed.  Other  Discharge summary notes several meds to be resumed, which had not been ordered while inpatient: Famotidine 20 mg BID (was using Pantoprazole daily PRN PTA; now on daily schedule. Vitamin B-12 1000 mcg IM weekly.  PRNs:   Alprazolam for anxiety,  Albuterol for wheezing or shortness of breath,  Benzonatate for cough Fluticasone nasal for allergies (was using PRN PTA)    Adjust GI prophylaxis meds if needed.   Consider resuming weekly B-12 injections.  Resume PRN meds if needed.    Clinically significant medication issues were identified that warrant physician communication and completion of prescribed/recommended actions by midnight of the next day:  Yes  Name of provider notified for urgent issues identified: Risa Grill, PA-C  Provider Method of Notification: secure chat  Pharmacist comments: Several adjustments made per discussion with S. Setzer, PA-C  CoSopt eye drops and weekly B-12 injections resumed.  Cyclobenzaprine TID prn changed to 10 mg qhs and 10 mg BID prn muscle spasms.  Methocarbamol prn muscle spasms discontinued.  Protonix daily is adequate for now.    May add Alprazolam prn later if needed.  To follow up K+ and add supplement if needed.    Time spent performing this drug regimen review (minutes):  [address]   Arty Baumgartner, [place] 10/02/2022 3:37 PM

## 2022-10-02 NOTE — Discharge Summary (Signed)
Physician Discharge Summary  Dana [name] VVO:[id number] DOB: Sep 20, 1965 DOA: 09/23/2022  PCP: [name], MD  Admit date: 09/23/2022 Discharge date: 10/02/2022  Admitted From: Home Disposition: CIR  Recommendations for Outpatient Follow-up:  Follow up with PCP in 1-2 weeks Follow-up with neurology outpatient Recommend reattempt voiding trial in 7-14 days  Home Health: N/A Equipment/Devices: Foley catheter  Discharge Condition: Stable CODE STATUS: Full code Diet recommendation: Heart healthy diet  History of present illness:  Dana [name] is a 57 year old female with past medical history significant for essential hypertension, fibromyalgia, DJD, headache, hypercholesterolemia, history of right breast mass, chronic back pain, anxiety, chronic opioid use, B12 deficiency, tobacco use disorder, morbid obesity who presented to [facility] ED on 1/8 with progressive limb weakness.  Patient reports weakness is ascending involving her legs and arms.  Concern for Guillain-Barr syndrome, neurology was consulted.  Patient underwent lumbar puncture by IR and was treated with IVIG x 5 days with good response to treatment.  Was seen by PT and OT with recommendation of acute inpatient rehab.  Patient will discharge to CIR on 1/17.  Hospital course:  Guillain-Barr syndrome Patient presented to the ED with a sending weakness involving bilateral upper and lower extremities.  Neurology was consulted given concern for Guillain-Barr syndrome.  Patient underwent lumbar puncture by interventional radiology with CSF fluid analysis with WBC 3, RBC 17,000 750, total protein 155, glucose 70.  Patient was started on IVIG and completed 5-day course with improvement of symptoms.  Patient was seen by PT and OT with recommendations of acute inpatient rehab.  Discharging to CIR for further rehabilitation.  Outpatient follow-up with neurology.  Essential hypertension Continue furosemide 40 mg p.o. twice daily,  metoprolol tartrate 50 mg p.o. twice daily.  Urinary tract infection Urine culture positive for E. coli and Klebsiella pneumonia.  Patient treated with ceftriaxone x 7 days.  Urinary retention Foley cath was placed during hospitalization.  Patient failed voiding trial on 1/13 and Foley catheter was replaced.  Patient will need repeat voiding trial in 7-14 days.  Anxiety Continue Xanax 0.5 mg 3 times daily as needed.  Chronic back pain/opioid dependence A lot of home rounds p.o. every 6 hours as needed severe pain, Flexeril 10 mg p.o. nightly, gabapentin 600 mg p.o. 3 times daily.  GERD: Continue Pepcid, Protonix  B12 deficiency: Continue supplemental B12; receives injections outpatient  Hypokalemia Repleted during hospitalization  Morbid obesity Body mass index is 48.02 kg/m.  Discussed with patient needs for aggressive lifestyle changes/weight loss as this complicates all facets of care.  Outpatient follow-up with PCP.    Discharge Diagnoses:  Principal Problem:   Guillain Barr syndrome [name]) Active Problems:   Obesity   Low back pain with sciatica   HTN (hypertension)   B12 deficiency   Debility   Hypokalemia   UTI (urinary tract infection)   Hypomagnesemia    Discharge Instructions  Discharge Instructions     Diet - low sodium heart healthy   Complete by: As directed    Increase activity slowly   Complete by: As directed       Allergies as of 10/02/2022       Reactions   Lisinopril Anaphylaxis   was hospitalized for 3 days   Codeine Hives   Hydrocodone Hives   Penicillins Itching, Swelling   PATIENT HAS HAD A PCN REACTION WITH IMMEDIATE RASH, FACIAL/TONGUE/THROAT SWELLING, SOB, OR LIGHTHEADEDNESS WITH HYPOTENSION:  #  #  YES  #  #  Has  patient had a PCN reaction causing severe rash involving mucus membranes or skin necrosis: No Has patient had a PCN reaction that required hospitalization No Has patient had a PCN reaction occurring within the last 10  years: No If all of the above answers are "NO", then may proceed with Cephalosporin use.   Amlodipine Swelling   Acetaminophen Rash        Medication List     STOP taking these medications    cyclobenzaprine 10 MG tablet Commonly known as: FLEXERIL   potassium chloride 10 MEQ tablet Commonly known as: Klor-Con 10   promethazine 25 MG tablet Commonly known as: PHENERGAN   temazepam 30 MG capsule Commonly known as: RESTORIL       TAKE these medications    albuterol 108 (90 Base) MCG/ACT inhaler Commonly known as: VENTOLIN HFA Inhale 2 puffs into the lungs every 6 (six) hours as needed for wheezing or shortness of breath.   ALPRAZolam 0.5 MG tablet Commonly known as: XANAX TAKE 1 TABLET (0.5 MG TOTAL) BY MOUTH 3 (THREE) TIMES DAILY AS NEEDED FOR ANXIETY.   benzonatate 100 MG capsule Commonly known as: Tessalon Perles 1-2 capsules up to twice daily as needed for cough   cyanocobalamin 1000 MCG/ML injection Commonly known as: VITAMIN B12 Inject 1 mL (1,000 mcg total) into the muscle once a week. What changed: additional instructions   dorzolamide-timolol 2-0.5 % ophthalmic solution Commonly known as: COSOPT 1 drop 2 (two) times daily.   famotidine 20 MG tablet Commonly known as: Pepcid Take 1 tablet (20 mg total) by mouth 2 (two) times daily.   fluticasone 50 MCG/ACT nasal spray Commonly known as: FLONASE SPRAY 2 SPRAYS INTO EACH NOSTRIL EVERY DAY What changed: See the new instructions.   folic acid 1 MG tablet Commonly known as: FOLVITE Take 1 tablet (1 mg total) by mouth daily.   furosemide 40 MG tablet Commonly known as: LASIX Take 1 tablet (40 mg total) by mouth in the morning and at bedtime.   gabapentin 300 MG capsule Commonly known as: Neurontin Take 2 capsules (600 mg total) by mouth 3 (three) times daily. What changed: how much to take   HYDROmorphone 4 MG tablet Commonly known as: Dilaudid Take 1 tablet (4 mg total) by mouth every 6  (six) hours as needed for severe pain.   latanoprost 0.005 % ophthalmic solution Commonly known as: XALATAN 1 drop at bedtime.   Luer Lock Safety Syringes 25G X 1" 3 ML Misc Generic drug: SYRINGE-NEEDLE (DISP) 3 ML 1 Application by Does not apply route once a week.   metoprolol tartrate 50 MG tablet Commonly known as: LOPRESSOR Take 1 tablet (50 mg total) by mouth 2 (two) times daily.   multivitamin with minerals Tabs tablet Take 1 tablet by mouth daily.   pantoprazole 40 MG tablet Commonly known as: PROTONIX Take 40 mg by mouth daily as needed (for acid reflux).        Follow-up Information     [name], MD. Schedule an appointment as soon as possible for a visit in 1 week(s).   Specialty: Family Medicine Contact information: [place] [address] [phone]                Allergies  Allergen Reactions   Lisinopril Anaphylaxis    was hospitalized for 3 days   Codeine Hives   Hydrocodone Hives   Penicillins Itching and Swelling    PATIENT HAS HAD A PCN REACTION WITH  IMMEDIATE RASH, FACIAL/TONGUE/THROAT SWELLING, SOB, OR LIGHTHEADEDNESS WITH HYPOTENSION:  #  #  YES  #  #  Has patient had a PCN reaction causing severe rash involving mucus membranes or skin necrosis: No Has patient had a PCN reaction that required hospitalization No Has patient had a PCN reaction occurring within the last 10 years: No If all of the above answers are "NO", then may proceed with Cephalosporin use.    Amlodipine Swelling   Acetaminophen Rash    Consultations: Neurology   Procedures/Studies: DG FL GUIDED LUMBAR PUNCTURE  Result Date: 09/25/2022 CLINICAL DATA:  Guillain-Barre. Unsuccessful lumbar puncture yesterday. EXAM: DIAGNOSTIC LUMBAR PUNCTURE UNDER FLUOROSCOPIC GUIDANCE COMPARISON:  Lumbar MRI 09/23/2022 FLUOROSCOPY: Radiation Exposure Index (as provided by the fluoroscopic device): 19.2 mGy Kerma PROCEDURE: Informed consent was obtained  from the patient prior to the procedure, including potential complications of headache, allergy, and pain. With the patient prone, the lower back was prepped with Betadine. 1% Lidocaine was used for local anesthesia. Lumbar puncture initially attempted through a laminectomy defect on the left L2-3 but this was unsuccessful. Lumbar puncture was performed at the L5-S1 level using a 6 inch, a 20 gauge needle with return of blood tinged CSF CSF with very low pressure. The CSF began to clear but then flow CSF stopped. 1/2 ml of CSF were obtained for laboratory studies. The patient tolerated the procedure well and there were no apparent complications. IMPRESSION: Very difficult lumbar puncture with severe spinal stenosis multiple levels. Lumbar puncture successful at L5-S1 but only 1/2 mL of blood tinged CSF obtained for laboratory studies. Electronically Signed   By: Franchot Gallo M.D.   On: 09/25/2022 14:59   DG FL GUIDED LUMBAR PUNCTURE  Result Date: 09/24/2022 CLINICAL DATA:  57 year old female. Presented to the ED with progressive ascending weakness. Request is for lumbar puncture for further evaluation EXAM: DIAGNOSTIC LUMBAR PUNCTURE UNDER FLUOROSCOPIC GUIDANCE COMPARISON:  None Available. FLUOROSCOPY: Radiation Exposure Index (as provided by the fluoroscopic device): 514.20 mGy Kerma PROCEDURE: Informed consent was obtained from the patient prior to the procedure, including potential complications of headache, allergy, and pain. With the patient prone, the lower back was prepped with Betadine. 1% Lidocaine was used for local anesthesia. Lumbar puncture was several times by Rushie Nyhan NP and Dr. Kalman Jewels performed at the L4-L5 and L5-S1 level using a 20 gauge needle 6 in/. The multiple attempts were unsuccessful and no fluid was obtained. The patient tolerated the procedure well and there were no apparent complications. IMPRESSION: Unsuccessful lumbar puncture Electronically Signed   By: Marijo Sanes M.D.   On: 09/24/2022 15:48   MR Lumbar Spine W Wo Contrast  Result Date: 09/23/2022 CLINICAL DATA:  Initial evaluation for ataxia, weakness. EXAM: MRI LUMBAR SPINE WITHOUT AND WITH CONTRAST TECHNIQUE: Multiplanar and multiecho pulse sequences of the lumbar spine were obtained without and with intravenous contrast. CONTRAST:  69m GADAVIST GADOBUTROL 1 MMOL/ML IV SOLN COMPARISON:  Prior study from 04/16/2018. FINDINGS: Segmentation:  Examination moderately degraded by motion artifact. Standard segmentation. Lowest well-formed disc space labeled the L5-S1 level. Alignment: Physiologic with preservation of the normal lumbar lordosis. No significant listhesis. Vertebrae: Vertebral body height maintained without acute or chronic fracture. Bone marrow signal intensity diffusely heterogeneous without worrisome osseous lesion. Discogenic reactive endplate changes present about the L1-2 and L2-3 interspaces. No other abnormal marrow edema or enhancement. Conus medullaris and cauda equina: Conus extends to the L1 level. Conus and cauda equina appear normal. Paraspinal and other soft tissues:  Paraspinous soft tissues demonstrate no acute finding. Asymmetric atrophy involving the left psoas muscle noted. Disc levels: L1-2: Disc bulge with disc desiccation. Superimposed left subarticular disc extrusion with inferior migration (series 22, image 11). Mild facet hypertrophy. Mild epidural lipomatosis. Resultant moderate canal with severe left lateral recess stenosis. Moderate left L1 foraminal narrowing. Right neural foramen remains patent. L2-3: Advanced degenerative intervertebral disc space narrowing with disc desiccation and diffuse disc bulge. Associated reactive endplate spurring. Prior left hemi laminectomy. Moderate facet hypertrophy. Mild epidural lipomatosis. Residual mild canal with bilateral subarticular stenosis. Mild-to-moderate bilateral foraminal narrowing. L3-4: Mild diffuse disc bulge with disc  desiccation. Central annular fissure. Moderate bilateral facet and ligament flavum hypertrophy with associated small joint effusions. Epidural lipomatosis. Resultant moderate spinal stenosis. Mild to moderate bilateral L3 foraminal narrowing. L4-5: Mild disc bulge with disc desiccation. Moderate bilateral facet arthrosis with small joint effusions. Epidural lipomatosis. Secondary compression of the distal thecal sac with moderate to severe spinal stenosis. Foramina remain patent. L5-S1: Disc desiccation with mild disc bulge. Mild bilateral facet hypertrophy. Epidural lipomatosis. No significant spinal stenosis. Foramina remain patent. IMPRESSION: 1. Left subarticular disc extrusion with inferior migration at L1-2 with resultant moderate canal and severe left lateral recess stenosis, with moderate left L1 foraminal narrowing. 2. Multifactorial degenerative changes at L3-4 with resultant moderate spinal stenosis, with mild to moderate bilateral L3 foraminal narrowing. 3. Epidural lipomatosis with resultant moderate to severe spinal stenosis at L4-5. Electronically Signed   By: Jeannine Boga M.D.   On: 09/23/2022 22:59   MR THORACIC SPINE W WO CONTRAST  Result Date: 09/23/2022 CLINICAL DATA:  Initial evaluation for ataxia, weakness. EXAM: MRI THORACIC WITHOUT AND WITH CONTRAST TECHNIQUE: Multiplanar and multiecho pulse sequences of the thoracic spine were obtained without and with intravenous contrast. CONTRAST:  74m GADAVIST GADOBUTROL 1 MMOL/ML IV SOLN COMPARISON:  None Available. FINDINGS: Alignment:  Examination moderately degraded by motion artifact. Vertebral bodies normally aligned with preservation of the normal thoracic kyphosis. No listhesis. Vertebrae: Vertebral body height maintained without acute or chronic fracture. Bone marrow signal intensity diffusely heterogeneous without worrisome osseous lesion. Scattered discogenic reactive endplate changes present throughout the thoracic spine. No other  abnormal marrow edema or enhancement. Cord:  Normal signal and morphology.  No abnormal enhancement. Paraspinal and other soft tissues: Unremarkable. Disc levels: Moderate degenerative spondylosis with multilevel degenerative disc bulging and reactive endplate spurring present throughout the thoracic spine. Changes are most pronounced within the midthoracic spine at T5-6 through T10-11. No high-grade spinal stenosis by CT. Moderate bilateral foraminal narrowing present at T10-11. Foramina remain otherwise grossly patent. IMPRESSION: 1. Normal MRI appearance of the thoracic spinal cord. No cord compression or evidence for myelopathy. 2. Moderate degenerative spondylosis throughout the thoracic spine without high-grade spinal stenosis. Moderate bilateral foraminal narrowing at T10-11. Electronically Signed   By: BJeannine BogaM.D.   On: 09/23/2022 22:43   MR Cervical Spine W or Wo Contrast  Result Date: 09/23/2022 CLINICAL DATA:  Initial evaluation for ataxia, weakness. EXAM: MRI CERVICAL SPINE WITHOUT AND WITH CONTRAST TECHNIQUE: Multiplanar and multiecho pulse sequences of the cervical spine, to include the craniocervical junction and cervicothoracic junction, were obtained without and with intravenous contrast. CONTRAST:  119mGADAVIST GADOBUTROL 1 MMOL/ML IV SOLN COMPARISON:  Prior MRI report from 07/15/2016. FINDINGS: Alignment: Examination moderately degraded by motion artifact, limiting assessment. Straightening of the normal cervical lordosis.  No listhesis. Vertebrae: Prior ACDF at C4-C7. Vertebral body height maintained without acute or chronic fracture. Bone marrow signal intensity overall  within normal limits. No worrisome osseous lesions. Discogenic reactive endplate changes noted about the T1-2 interspace. No other abnormal marrow edema. Cord: Normal signal and morphology.  No abnormal enhancement. Posterior Fossa, vertebral arteries, paraspinal tissues: Unremarkable. Disc levels: C2-C3:  Minimal disc bulge with bilateral uncovertebral spurring. No significant spinal stenosis. Foramina remain patent. C3-C4: Mild disc bulge with bilateral uncovertebral spurring. No significant spinal stenosis. Foramina remain patent. C4-C5: Prior fusion. No residual spinal stenosis. Foramina remain patent. C5-C6: Prior fusion. No residual spinal stenosis. Bilateral uncovertebral spurring with residual moderate left with mild-to-moderate right C6 foraminal stenosis. C6-C7: Prior fusion. No residual spinal stenosis. Bilateral uncovertebral spurring with residual mild to moderate bilateral C7 foraminal stenosis. C7-T1: Degenerative intervertebral disc space narrowing with mild disc bulge. Right greater than left uncovertebral spurring. No significant spinal stenosis. Mild right C8 foraminal stenosis. Left neural foramina remains patent. IMPRESSION: 1. Normal MRI appearance of the cervical spinal cord. No cord compression or evidence for myelopathy. 2. Prior ACDF at C4-C7 without residual spinal stenosis. 3. Mild to moderate bilateral C6 and C7 and right C8 foraminal stenosis related to uncovertebral disease. Electronically Signed   By: Jeannine Boga M.D.   On: 09/23/2022 22:32   CT Head Wo Contrast  Result Date: 09/23/2022 CLINICAL DATA:  Headache EXAM: CT HEAD WITHOUT CONTRAST TECHNIQUE: Contiguous axial images were obtained from the base of the skull through the vertex without intravenous contrast. RADIATION DOSE REDUCTION: This exam was performed according to the departmental dose-optimization program which includes automated exposure control, adjustment of the mA and/or kV according to patient size and/or use of iterative reconstruction technique. COMPARISON:  None Available. FINDINGS: Brain: No evidence of acute infarction, hemorrhage, hydrocephalus, extra-axial collection or mass lesion/mass effect. Partially empty sella. Vascular: No hyperdense vessel or unexpected calcification. Skull: Normal. Negative  for fracture or focal lesion. Sinuses/Orbits: No acute finding. Other: None. IMPRESSION: No specific etiology for headaches identified. Electronically Signed   By: Marin Roberts M.D.   On: 09/23/2022 17:01   DG Chest Portable 1 View  Result Date: 09/23/2022 CLINICAL DATA:  Numbness all over.  Weakness. EXAM: PORTABLE CHEST 1 VIEW COMPARISON:  04/23/2018. FINDINGS: Normal heart, mediastinum and hila. Clear lungs.  No pleural effusion or pneumothorax. Skeletal structures are grossly intact. Stable changes from a previous anterior cervical spine fusion. IMPRESSION: No active disease. Electronically Signed   By: Lajean Manes M.D.   On: 09/23/2022 13:22     Subjective: Patient seen examined bedside, resting comfortably.  Getting washed up at bedside.  CIR bed available today.  No other questions or concerns at this time.  Denies headache, no dizziness, no chest pain, no palpitations, no shortness of breath, no abdominal pain.  No acute events overnight per nursing staff.  Discharge Exam: Vitals:   10/02/22 0500 10/02/22 0849  BP: 118/72 (!) 147/92  Pulse: 86 81  Resp: 18 18  Temp: 98.2 F (36.8 C) 98.2 F (36.8 C)  SpO2: 97% 97%   Vitals:   10/01/22 2157 10/02/22 0300 10/02/22 0500 10/02/22 0849  BP: 138/81  118/72 (!) 147/92  Pulse: 91  86 81  Resp: '18  18 18  '$ Temp: 98 F (36.7 C)  98.2 F (36.8 C) 98.2 F (36.8 C)  TempSrc: Oral Oral Oral Oral  SpO2: 99%  97% 97%  Weight:      Height:        Physical Exam: GEN: NAD, alert and oriented x 3, obese HEENT: NCAT, PERRL, EOMI, sclera clear, MMM PULM:  CTAB w/o wheezes/crackles, normal respiratory effort, on room air CV: RRR w/o M/G/R GI: abd soft, NTND, NABS, no R/G/M MSK: no peripheral edema, moves all extremities independently, continues with residual bilateral upper/lower extremity weakness but improving NEURO: CN II-XII intact PSYCH: normal mood/affect Integumentary: dry/intact, no rashes or wounds    The results of  significant diagnostics from this hospitalization (including imaging, microbiology, ancillary and laboratory) are listed below for reference.     Microbiology: Recent Results (from the past 240 hour(s))  Resp panel by RT-PCR (RSV, Flu A&B, Covid) Anterior Nasal Swab     Status: None   Collection Time: 09/23/22  1:31 PM   Specimen: Anterior Nasal Swab  Result Value Ref Range Status   SARS Coronavirus 2 by RT PCR NEGATIVE NEGATIVE Final    Comment: (NOTE) SARS-CoV-2 target nucleic acids are NOT DETECTED.  The SARS-CoV-2 RNA is generally detectable in upper respiratory specimens during the acute phase of infection. The lowest concentration of SARS-CoV-2 viral copies this assay can detect is 138 copies/mL. A negative result does not preclude SARS-Cov-2 infection and should not be used as the sole basis for treatment or other patient management decisions. A negative result may occur with  improper specimen collection/handling, submission of specimen other than nasopharyngeal swab, presence of viral mutation(s) within the areas targeted by this assay, and inadequate number of viral copies(<138 copies/mL). A negative result must be combined with clinical observations, patient history, and epidemiological information. The expected result is Negative.  Fact Sheet for Patients:  EntrepreneurPulse.com.au  Fact Sheet for Healthcare Providers:  IncredibleEmployment.be  This test is no t yet approved or cleared by the [place] FDA and  has been authorized for detection and/or diagnosis of SARS-CoV-2 by FDA under an Emergency Use Authorization (EUA). This EUA will remain  in effect (meaning this test can be used) for the duration of the COVID-19 declaration under Section 564(b)(1) of the Act, 21 U.S.C.section 360bbb-3(b)(1), unless the authorization is terminated  or revoked sooner.       Influenza A by PCR NEGATIVE NEGATIVE Final   Influenza B by  PCR NEGATIVE NEGATIVE Final    Comment: (NOTE) The Xpert Xpress SARS-CoV-2/FLU/RSV plus assay is intended as an aid in the diagnosis of influenza from Nasopharyngeal swab specimens and should not be used as a sole basis for treatment. Nasal washings and aspirates are unacceptable for Xpert Xpress SARS-CoV-2/FLU/RSV testing.  Fact Sheet for Patients: EntrepreneurPulse.com.au  Fact Sheet for Healthcare Providers: IncredibleEmployment.be  This test is not yet approved or cleared by the [place] FDA and has been authorized for detection and/or diagnosis of SARS-CoV-2 by FDA under an Emergency Use Authorization (EUA). This EUA will remain in effect (meaning this test can be used) for the duration of the COVID-19 declaration under Section 564(b)(1) of the Act, 21 U.S.C. section 360bbb-3(b)(1), unless the authorization is terminated or revoked.     Resp Syncytial Virus by PCR NEGATIVE NEGATIVE Final    Comment: (NOTE) Fact Sheet for Patients: EntrepreneurPulse.com.au  Fact Sheet for Healthcare Providers: IncredibleEmployment.be  This test is not yet approved or cleared by the [place] FDA and has been authorized for detection and/or diagnosis of SARS-CoV-2 by FDA under an Emergency Use Authorization (EUA). This EUA will remain in effect (meaning this test can be used) for the duration of the COVID-19 declaration under Section 564(b)(1) of the Act, 21 U.S.C. section 360bbb-3(b)(1), unless the authorization is terminated or revoked.  Performed at [facility], [address]  [address], [place] [address]   Urine Culture     Status: Abnormal   Collection Time: 09/24/22  1:39 AM   Specimen: Urine, Clean Catch  Result Value Ref Range Status   Specimen Description URINE, CLEAN CATCH  Final   Special Requests   Final    NONE Performed at [facility], [place] [address]  [place],  [address]    Culture (A)  Final    >=100,000 COLONIES/mL ESCHERICHIA COLI 80,000 COLONIES/mL KLEBSIELLA PNEUMONIAE    Report Status 09/26/2022 FINAL  Final   Organism ID, Bacteria ESCHERICHIA COLI (A)  Final   Organism ID, Bacteria KLEBSIELLA PNEUMONIAE (A)  Final      Susceptibility   Escherichia coli - MIC*    AMPICILLIN <=2 SENSITIVE Sensitive     CEFAZOLIN <=4 SENSITIVE Sensitive     CEFEPIME <=0.12 SENSITIVE Sensitive     CEFTRIAXONE <=0.25 SENSITIVE Sensitive     CIPROFLOXACIN <=0.25 SENSITIVE Sensitive     GENTAMICIN <=1 SENSITIVE Sensitive     IMIPENEM <=0.25 SENSITIVE Sensitive     NITROFURANTOIN <=16 SENSITIVE Sensitive     TRIMETH/SULFA <=20 SENSITIVE Sensitive     AMPICILLIN/SULBACTAM <=2 SENSITIVE Sensitive     PIP/TAZO <=4 SENSITIVE Sensitive     * >=100,000 COLONIES/mL ESCHERICHIA COLI   Klebsiella pneumoniae - MIC*    AMPICILLIN >=32 RESISTANT Resistant     CEFAZOLIN <=4 SENSITIVE Sensitive     CEFEPIME <=0.12 SENSITIVE Sensitive     CEFTRIAXONE <=0.25 SENSITIVE Sensitive     CIPROFLOXACIN <=0.25 SENSITIVE Sensitive     GENTAMICIN <=1 SENSITIVE Sensitive     IMIPENEM <=0.25 SENSITIVE Sensitive     NITROFURANTOIN 32 SENSITIVE Sensitive     TRIMETH/SULFA <=20 SENSITIVE Sensitive     AMPICILLIN/SULBACTAM 4 SENSITIVE Sensitive     PIP/TAZO <=4 SENSITIVE Sensitive     * 80,000 COLONIES/mL KLEBSIELLA PNEUMONIAE     Labs: BNP (last 3 results) No results for input(s): "BNP" in the last 8760 hours. Basic Metabolic Panel: Recent Labs  Lab 09/27/22 0628 09/29/22 0517 10/01/22 0341  NA 136 135 134*  K 4.1 4.2 4.5  CL 102 103 100  CO2 '24 25 26  '$ GLUCOSE 103* 129* 109*  BUN '7 8 11  '$ CREATININE 0.76 0.87 0.75  CALCIUM 8.3* 8.6* 8.8*  MG 1.7  --   --    Liver Function Tests: No results for input(s): "AST", "ALT", "ALKPHOS", "BILITOT", "PROT", "ALBUMIN" in the last 168 hours. No results for input(s): "LIPASE", "AMYLASE" in the last 168 hours. No  results for input(s): "AMMONIA" in the last 168 hours. CBC: Recent Labs  Lab 09/27/22 0628  WBC 5.3  HGB 11.1*  HCT 34.2*  MCV 92.4  PLT 297   Cardiac Enzymes: No results for input(s): "CKTOTAL", "CKMB", "CKMBINDEX", "TROPONINI" in the last 168 hours. BNP: Invalid input(s): "POCBNP" CBG: No results for input(s): "GLUCAP" in the last 168 hours. D-Dimer No results for input(s): "DDIMER" in the last 72 hours. Hgb A1c No results for input(s): "HGBA1C" in the last 72 hours. Lipid Profile No results for input(s): "CHOL", "HDL", "LDLCALC", "TRIG", "CHOLHDL", "LDLDIRECT" in the last 72 hours. Thyroid function studies No results for input(s): "TSH", "T4TOTAL", "T3FREE", "THYROIDAB" in the last 72 hours.  Invalid input(s): "FREET3" Anemia work up No results for input(s): "VITAMINB12", "FOLATE", "FERRITIN", "TIBC", "IRON", "RETICCTPCT" in the last 72 hours. Urinalysis    Component Value Date/Time   COLORURINE AMBER (A) 09/23/2022 2255   APPEARANCEUR CLOUDY (A) 09/23/2022  2255   LABSPEC 1.024 09/23/2022 2255   PHURINE 5.0 09/23/2022 2255   GLUCOSEU NEGATIVE 09/23/2022 2255   GLUCOSEU NEGATIVE 05/03/2022 1345   HGBUR NEGATIVE 09/23/2022 2255   BILIRUBINUR NEGATIVE 09/23/2022 2255   BILIRUBINUR neg 12/31/2021 1311   KETONESUR 5 (A) 09/23/2022 2255   PROTEINUR 30 (A) 09/23/2022 2255   UROBILINOGEN 1.0 05/03/2022 1345   NITRITE POSITIVE (A) 09/23/2022 2255   LEUKOCYTESUR SMALL (A) 09/23/2022 2255   Sepsis Labs Recent Labs  Lab 09/27/22 0628  WBC 5.3   Microbiology Recent Results (from the past 240 hour(s))  Resp panel by RT-PCR (RSV, Flu A&B, Covid) Anterior Nasal Swab     Status: None   Collection Time: 09/23/22  1:31 PM   Specimen: Anterior Nasal Swab  Result Value Ref Range Status   SARS Coronavirus 2 by RT PCR NEGATIVE NEGATIVE Final    Comment: (NOTE) SARS-CoV-2 target nucleic acids are NOT DETECTED.  The SARS-CoV-2 RNA is generally detectable in upper  respiratory specimens during the acute phase of infection. The lowest concentration of SARS-CoV-2 viral copies this assay can detect is 138 copies/mL. A negative result does not preclude SARS-Cov-2 infection and should not be used as the sole basis for treatment or other patient management decisions. A negative result may occur with  improper specimen collection/handling, submission of specimen other than nasopharyngeal swab, presence of viral mutation(s) within the areas targeted by this assay, and inadequate number of viral copies(<138 copies/mL). A negative result must be combined with clinical observations, patient history, and epidemiological information. The expected result is Negative.  Fact Sheet for Patients:  EntrepreneurPulse.com.au  Fact Sheet for Healthcare Providers:  IncredibleEmployment.be  This test is no t yet approved or cleared by the [place] FDA and  has been authorized for detection and/or diagnosis of SARS-CoV-2 by FDA under an Emergency Use Authorization (EUA). This EUA will remain  in effect (meaning this test can be used) for the duration of the COVID-19 declaration under Section 564(b)(1) of the Act, 21 U.S.C.section 360bbb-3(b)(1), unless the authorization is terminated  or revoked sooner.       Influenza A by PCR NEGATIVE NEGATIVE Final   Influenza B by PCR NEGATIVE NEGATIVE Final    Comment: (NOTE) The Xpert Xpress SARS-CoV-2/FLU/RSV plus assay is intended as an aid in the diagnosis of influenza from Nasopharyngeal swab specimens and should not be used as a sole basis for treatment. Nasal washings and aspirates are unacceptable for Xpert Xpress SARS-CoV-2/FLU/RSV testing.  Fact Sheet for Patients: EntrepreneurPulse.com.au  Fact Sheet for Healthcare Providers: IncredibleEmployment.be  This test is not yet approved or cleared by the [place] FDA and has been  authorized for detection and/or diagnosis of SARS-CoV-2 by FDA under an Emergency Use Authorization (EUA). This EUA will remain in effect (meaning this test can be used) for the duration of the COVID-19 declaration under Section 564(b)(1) of the Act, 21 U.S.C. section 360bbb-3(b)(1), unless the authorization is terminated or revoked.     Resp Syncytial Virus by PCR NEGATIVE NEGATIVE Final    Comment: (NOTE) Fact Sheet for Patients: EntrepreneurPulse.com.au  Fact Sheet for Healthcare Providers: IncredibleEmployment.be  This test is not yet approved or cleared by the [place] FDA and has been authorized for detection and/or diagnosis of SARS-CoV-2 by FDA under an Emergency Use Authorization (EUA). This EUA will remain in effect (meaning this test can be used) for the duration of the COVID-19 declaration under Section 564(b)(1) of the Act, 21 U.S.C. section 360bbb-3(b)(1), unless  the authorization is terminated or revoked.  Performed at [facility], [place] [address], [place], [place] [address]   Urine Culture     Status: Abnormal   Collection Time: 09/24/22  1:39 AM   Specimen: Urine, Clean Catch  Result Value Ref Range Status   Specimen Description URINE, CLEAN CATCH  Final   Special Requests   Final    NONE Performed at [facility], [place] [address], [place],  [address]    Culture (A)  Final    >=100,000 COLONIES/mL ESCHERICHIA COLI 80,000 COLONIES/mL KLEBSIELLA PNEUMONIAE    Report Status 09/26/2022 FINAL  Final   Organism ID, Bacteria ESCHERICHIA COLI (A)  Final   Organism ID, Bacteria KLEBSIELLA PNEUMONIAE (A)  Final      Susceptibility   Escherichia coli - MIC*    AMPICILLIN <=2 SENSITIVE Sensitive     CEFAZOLIN <=4 SENSITIVE Sensitive     CEFEPIME <=0.12 SENSITIVE Sensitive     CEFTRIAXONE <=0.25 SENSITIVE Sensitive     CIPROFLOXACIN <=0.25 SENSITIVE Sensitive     GENTAMICIN <=1 SENSITIVE  Sensitive     IMIPENEM <=0.25 SENSITIVE Sensitive     NITROFURANTOIN <=16 SENSITIVE Sensitive     TRIMETH/SULFA <=20 SENSITIVE Sensitive     AMPICILLIN/SULBACTAM <=2 SENSITIVE Sensitive     PIP/TAZO <=4 SENSITIVE Sensitive     * >=100,000 COLONIES/mL ESCHERICHIA COLI   Klebsiella pneumoniae - MIC*    AMPICILLIN >=32 RESISTANT Resistant     CEFAZOLIN <=4 SENSITIVE Sensitive     CEFEPIME <=0.12 SENSITIVE Sensitive     CEFTRIAXONE <=0.25 SENSITIVE Sensitive     CIPROFLOXACIN <=0.25 SENSITIVE Sensitive     GENTAMICIN <=1 SENSITIVE Sensitive     IMIPENEM <=0.25 SENSITIVE Sensitive     NITROFURANTOIN 32 SENSITIVE Sensitive     TRIMETH/SULFA <=20 SENSITIVE Sensitive     AMPICILLIN/SULBACTAM 4 SENSITIVE Sensitive     PIP/TAZO <=4 SENSITIVE Sensitive     * 80,000 COLONIES/mL KLEBSIELLA PNEUMONIAE     Time coordinating discharge: Over 30 minutes  SIGNED:   Karnell Vanderloop J [place] DO  [facility] 10/02/2022, 10:39 AM

## 2022-10-03 ENCOUNTER — Ambulatory Visit: Payer: 59 | Admitting: Physician Assistant

## 2022-10-03 DIAGNOSIS — G610 Guillain-Barre syndrome: Secondary | ICD-10-CM | POA: Diagnosis not present

## 2022-10-03 LAB — COMPREHENSIVE METABOLIC PANEL
ALT: 31 U/L (ref 0–44)
AST: 39 U/L (ref 15–41)
Albumin: 2.7 g/dL — ABNORMAL LOW (ref 3.5–5.0)
Alkaline Phosphatase: 69 U/L (ref 38–126)
Anion gap: 12 (ref 5–15)
BUN: 18 mg/dL (ref 6–20)
CO2: 24 mmol/L (ref 22–32)
Calcium: 8.9 mg/dL (ref 8.9–10.3)
Chloride: 97 mmol/L — ABNORMAL LOW (ref 98–111)
Creatinine, Ser: 1.02 mg/dL — ABNORMAL HIGH (ref 0.44–1.00)
GFR, Estimated: >60 mL/min (ref >60)
Glucose, Bld: 104 mg/dL — ABNORMAL HIGH (ref 70–99)
Potassium: 4.8 mmol/L (ref 3.5–5.1)
Sodium: 133 mmol/L — ABNORMAL LOW (ref 135–145)
Total Bilirubin: 0.7 mg/dL (ref 0.3–1.2)
Total Protein: 7.5 g/dL (ref 6.5–8.1)

## 2022-10-03 LAB — CBC WITH DIFFERENTIAL/PLATELET
Abs Immature Granulocytes: 0.02 10*3/uL (ref 0.00–0.07)
Basophils Absolute: 0 10*3/uL (ref 0.0–0.1)
Basophils Relative: 0 %
Eosinophils Absolute: 0.1 10*3/uL (ref 0.0–0.5)
Eosinophils Relative: 2 %
HCT: 37.8 % (ref 36.0–46.0)
Hemoglobin: 12.1 g/dL (ref 12.0–15.0)
Immature Granulocytes: 0 %
Lymphocytes Relative: 25 %
Lymphs Abs: 1.2 10*3/uL (ref 0.7–4.0)
MCH: 29.7 pg (ref 26.0–34.0)
MCHC: 32 g/dL (ref 30.0–36.0)
MCV: 92.9 fL (ref 80.0–100.0)
Monocytes Absolute: 0.8 10*3/uL (ref 0.1–1.0)
Monocytes Relative: 16 %
Neutro Abs: 2.8 10*3/uL (ref 1.7–7.7)
Neutrophils Relative %: 57 %
Platelets: UNDETERMINED 10*3/uL (ref 150–400)
RBC: 4.07 MIL/uL (ref 3.87–5.11)
RDW: 15.9 % — ABNORMAL HIGH (ref 11.5–15.5)
WBC: 4.9 10*3/uL (ref 4.0–10.5)
nRBC: 0 % (ref 0.0–0.2)

## 2022-10-03 LAB — MAGNESIUM: Magnesium: 1.9 mg/dL (ref 1.7–2.4)

## 2022-10-03 MED ORDER — TAMSULOSIN HCL 0.4 MG PO CAPS
0.4000 mg | ORAL_CAPSULE | Freq: Every day | ORAL | Status: DC
  Administered 2022-10-03 – 2022-11-05 (×34): 0.4 mg via ORAL
  Filled 2022-10-03 (×34): qty 1

## 2022-10-03 MED ORDER — CHLORHEXIDINE GLUCONATE CLOTH 2 % EX PADS
6.0000 | MEDICATED_PAD | Freq: Two times a day (BID) | CUTANEOUS | Status: DC
  Administered 2022-10-03 (×2): 6 via TOPICAL

## 2022-10-03 NOTE — Discharge Instructions (Addendum)
Inpatient Rehab Discharge Instructions  Dana [name] Discharge date and time: 11/06/2022  Activities/Precautions/ Functional Status: Activity: no lifting, driving, or strenuous exercise until cleared by MD Diet: regular diet Wound Care: none needed Functional status:  ___ No restrictions     ___ Walk up steps independently ___ 24/7 supervision/assistance   ___ Walk up steps with assistance __x_ Intermittent supervision/assistance  ___ Bathe/dress independently ___ Walk with walker     ___ Bathe/dress with assistance ___ Walk Independently    ___ Shower independently ___ Walk with assistance    __x_ Shower with assistance _x__ No alcohol     ___ Return to work/school ________  Special Instructions: No driving, alcohol consumption or tobacco use.   COMMUNITY REFERRALS UPON DISCHARGE:    Home Health:   PT   OT   AIDE                 Agency: [facility] Phone: [phone]    Medical Equipment/Items Ordered: HOSPITAL BED, WIDE DROP-ARM BEDSIDE COMMODE &TUB [place]                                                   POWER [facility] MED[facility] [phone]  HAS ROLLING WALKER FROM FAMILY MEMBER                                                              PURCHASED A STEDY ON-LINE HAS BEEN DELIVERED TO HOME  My questions have been answered and I understand these instructions. I will adhere to these goals and the provided educational materials after my discharge from the hospital.  Patient/Caregiver Signature _______________________________ Date __________  Clinician Signature _______________________________________ Date __________  Please bring this form and your medication list with you to all your follow-up doctor's appointments.

## 2022-10-03 NOTE — Progress Notes (Signed)
Inpatient Rehabilitation  Patient information reviewed and entered into eRehab system by Quanah Majka M. Cane Dubray, M.A., CCC/SLP, PPS Coordinator.  Information including medical coding, functional ability and quality indicators will be reviewed and updated through discharge.    

## 2022-10-03 NOTE — Progress Notes (Addendum)
Met with patient. Discussed education and binder. Verified that going home with spouse to 1 level home with ramp.  Diet is heart healthy and would like to be changed to regular diet.  Wants to have salt. Informed that is on heart healthy diet. Suggested a salt substitute. Doesn't like to substitute on tray. Messaged PA.  Discussed GBS. She reports that daughter Kennon Holter the MD that diagnosed her on an appropriate diet.  I informed that not aware of diet that can help but typically would be foods that can reduce symptoms. IE. Folic acid for instance. Also has IBS of which would be the most important to keep d/t outcomes from eating things that would irritate the bowel.  She likes a lot of foods that do irritate bowels such as ice cream and chocolate.  If going to eat these things to eat very small amounts but try to avoid them. Also discussed HTN and suggested to get blood pressure cuff for home. Provided a chart to document once she is home. Discussed cholesterol levels and foods to increase HDLs and reduce LDLs. Provided handout on foods to increase magnesium and B12. B12 levels 1809.  Also reports that if get injection that has a reaction will take 2 benadryl tabs before taking injection.   Also discussed that the foley will be removed tomorrow at 0600.  Discussed the possibility that may not void right away.  Notified her that will start Flomax tonight which will help the bladder.  She reports that is able to go to bathroom and has done with therapy.  I provided education to try and void that gravity will assist but to always try.  Also discussed the possibility that may need to be cath'd and not be discouraged.  She voiced an understanding.   Will notify PA of reaction to B12.  Therapy in room. Daughter at bedside. All needs met.

## 2022-10-03 NOTE — Discharge Summary (Signed)
Physician Discharge Summary  Patient ID: Dana [name] MRN: [id number] DOB/AGE: 1966/08/30 57 y.o.  Admit date: 10/02/2022 Discharge date: 11/06/2022  Discharge Diagnoses:  Principal Problem:   Guillain Barr syndrome [facility]) Active problems: Debility secondary to Guillain-Barr syndrome Acute neuropathy Acute anxiety Hypoalbuminemia Vitamin B12 deficiency Chronic back pain/radiculopathy Urinary retention Hypertension Class III obesity Hyponatremia Elevated serum creatinine Glaucoma Tobacco use Constipation Insomnia Hyperkalemia  Discharged Condition: stable  Significant Diagnostic Studies: Narrative & Impression  CLINICAL DATA:  Indication states fall. Patient reports atraumatic right knee pain.   EXAM: PORTABLE RIGHT KNEE - 1-2 VIEW   COMPARISON:  Right knee radiographs 11/09/2020   FINDINGS: Severe patellofemoral joint space narrowing. Severe superior and mild inferior patellar degenerative osteophytosis. Moderate superior trochlear degenerative osteophytosis. No joint effusion. Severe medial compartment joint space narrowing with moderate peripheral medial and lateral compartment degenerative osteophytes.   No acute fracture or dislocation.   IMPRESSION: Severe patellofemoral and medial compartment osteoarthritis.     Electronically Signed   By: Yvonne Kendall M.D.   On: 10/17/2022 15:05      Result History  Labs:  Basic Metabolic Panel: Recent Labs  Lab 11/04/22 0554 11/05/22 0529 11/06/22 0525  NA 137 139 139  K 6.1* 5.5* 4.8  CL 102 104 105  CO2 '25 27 25  '$ GLUCOSE 93 95 104*  BUN 31* 24* 19  CREATININE 1.64* 1.30* 1.19*  CALCIUM 8.8* 8.7* 8.8*    CBC: Recent Labs  Lab 11/04/22 0554  WBC 7.6  HGB 9.9*  HCT 31.1*  MCV 91.5  PLT 308    Brief HPI:   Dana [name] is a 57 y.o. female who presented to the [facility] ED on 09/23/2022 with bilateral lower extremity weakness with decreased mobility that has progressed to  both arms. She also reported numbness and tingling of the extremities. She was transferred to [facility] for MRI. 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 Albuminocytologic dissociation on LP under fluoro doen one 1/10. She received IVIG for 2 g/kg daily divided over 5 days. NIF's/VIC every 12 hours continue. Foley catheter placed 1/10 and urine culture obtained positive for E. coli and Klebsiella pneumoniae. She was treated with Rocephin for 7 days. Foley catheter was discontinued, however she developed urinary retention and Foley catheter was placed on 1/15. Home medications continued for chronic back pain/radiculopathy, hypertension, acid reflux, B12 deficiency, glaucoma. She has had improvement in extremity weakness. Last NIF -40 and VC 2.2L performed on 1/14. She is tolerating a regular diet.    Hospital Course: Dana [name] was admitted to rehab 10/02/2022 for inpatient therapies to consist of PT, ST and OT at least three hours five days a week. Past admission physiatrist, therapy team and rehab RN have worked together to provide customized collaborative inpatient rehab. Burning and tingling sensation in hands persists. Indwelling Foley in admission and was started on Flomax 0.4 mg 1/18. Foley discontinued on 1/19. Gabapentin increased to 900 mg TID. Required in and out caths but retention resolved over the next two days. Kenalog injection of right knee performed on 1/23 by Dr. Dagoberto Ligas. Acute anxiety worsening and started on Buspar 5 mg TID. Wrist splints ordered for both hands due to fingers curling. Celexa 20 mg added 1/24 for mood/anxiety. NIF/VC remained normal. Bilateral wrist splints applied. Time course of recovery in GBS discussed. Reassured pt that she will see further improvement in sensory function.  Lasix decreased to 40 mg daily. Patient desirous of SNF placement. Right knee brace  obtained for knee weakness and buckling. Pain controlled with home regimen of oral Dilaudid along with  gabapentin and Flexeril. Buspar increased on 2/01. Increasing right knee pain treated with diclofenac 50 mg TID times 5 days. X-ray showed severe DJD. Neuropsychology consult performed on 2/03.  BUN and creatinine increased to 26/2.29 on 2/06. Started IVFs. Increased Celexa to 40 mg daily on 2/07 due to anxiety interfering with therapy sessions. Flexeril 10 mg TID scheduled for muscle spasms. VC and NIF have remained normal and monitoring was discontinued on 2/08. Oral hydration improved. Follow-up labs on 2/13: BUN/Cr was down to 16/1.12. Added diclofenac 50 mg BID as well as trigger point injections for worsening back pain 2/15. Follow-up labs 2/19 with elevated K+, BUN and creatinine. Stopped diclofenac and given 1 liter NS fluid bolus and Lokelma. Follow-up BMP improved but K+ still elevated. Additional IVFs and Lokelma given. Potassium level normal on 2/21 and serum creatinine down to 1.19.  Blood pressures were monitored on TID basis and remained controlled on Lasix 40 mg daily, Lopressor 50 mg BID. Magnesium gluconate 250 mg added q HS. Discontinued Lasix on 2/07 due to rise in BUN and creatinine.  Rehab course: During patient's stay in rehab weekly team conferences were held to monitor patient's progress, set goals and discuss barriers to discharge. At admission, patient required max assist with mobility and total assist with basic self-care skills.  She  has had improvement in activity tolerance, balance, postural control as well as ability to compensate for deficits. She has had improvement in functional use RUE/LUE  and RLE/LLE as well as improvement in awareness Walked a totoal of 90 feet with RW on 2/15.  Discharge disposition: 01-Home or Self Care      Diet: Regular  Special Instructions: No driving, alcohol consumption or tobacco use.  Close follow-up with PCP and labs to follow serum creatinine hemoglobin.  30-35 minutes were spent on discharge planning and discharge  summary.  Discharge Instructions     Ambulatory referral to Neurology   Complete by: As directed    An appointment is requested in approximately: 4 weeks   Ambulatory referral to Physical Medicine Rehab   Complete by: As directed    Hospital follow-up   Discharge patient   Complete by: As directed    Discharge disposition: 01-Home or Self Care   Discharge patient date: 11/06/2022      Allergies as of 11/06/2022       Reactions   Lisinopril Anaphylaxis   was hospitalized for 3 days   Codeine Hives   Cyanocobalamin [vitamin B12] Itching   Patient able to take B12 injections with Benadryl.  Has no issues when taking B12 tablets.    Hydrocodone Hives   Penicillins Itching, Swelling   PATIENT HAS HAD A PCN REACTION WITH IMMEDIATE RASH, FACIAL/TONGUE/THROAT SWELLING, SOB, OR LIGHTHEADEDNESS WITH HYPOTENSION:  #  #  YES  #  #  Has patient had a PCN reaction causing severe rash involving mucus membranes or skin necrosis: No Has patient had a PCN reaction that required hospitalization No Has patient had a PCN reaction occurring within the last 10 years: No If all of the above answers are "NO", then may proceed with Cephalosporin use.   Amlodipine Swelling   Acetaminophen Rash        Medication List     STOP taking these medications    albuterol 108 (90 Base) MCG/ACT inhaler Commonly known as: VENTOLIN HFA   ALPRAZolam 0.5 MG tablet Commonly  known as: XANAX   benzonatate 100 MG capsule Commonly known as: Tessalon Perles   famotidine 20 MG tablet Commonly known as: Pepcid   furosemide 40 MG tablet Commonly known as: Engineer, maintenance Syringes 25G X 1" 3 ML Misc Generic drug: SYRINGE-NEEDLE (DISP) 3 ML       TAKE these medications    busPIRone 10 MG tablet Commonly known as: BUSPAR Take 1 tablet (10 mg total) by mouth 3 (three) times daily.   citalopram 40 MG tablet Commonly known as: CELEXA Take 1 tablet (40 mg total) by mouth daily.    cyanocobalamin 1000 MCG/ML injection Commonly known as: VITAMIN B12 Inject 1 mL (1,000 mcg total) into the muscle once a week. What changed: additional instructions   cyclobenzaprine 10 MG tablet Commonly known as: FLEXERIL Take 1 tablet (10 mg total) by mouth 3 (three) times daily.   dorzolamide-timolol 2-0.5 % ophthalmic solution Commonly known as: COSOPT 1 drop 2 (two) times daily.   Eliquis 2.5 MG Tabs tablet Generic drug: apixaban Take 1 tablet (2.5 mg total) by mouth 2 (two) times daily.   fluticasone 50 MCG/ACT nasal spray Commonly known as: FLONASE SPRAY 2 SPRAYS INTO EACH NOSTRIL EVERY DAY What changed: See the new instructions.   folic acid 1 MG tablet Commonly known as: FOLVITE Take 1 tablet (1 mg total) by mouth daily.   gabapentin 300 MG capsule Commonly known as: Neurontin Take 3 capsules (900 mg total) by mouth 3 (three) times daily. What changed: how much to take   hydrocerin Crea Apply 1 Application topically 2 (two) times daily.   HYDROmorphone 4 MG tablet Commonly known as: Dilaudid Take 1 tablet (4 mg total) by mouth every 6 (six) hours as needed for severe pain.   latanoprost 0.005 % ophthalmic solution Commonly known as: XALATAN 1 drop at bedtime.   magnesium oxide 400 MG tablet Commonly known as: MAG-OX Take 0.5 tablets (200 mg total) by mouth at bedtime.   metoprolol tartrate 50 MG tablet Commonly known as: LOPRESSOR Take 1 tablet (50 mg total) by mouth 2 (two) times daily.   multivitamin with minerals Tabs tablet Take 1 tablet by mouth daily.   pantoprazole 40 MG tablet Commonly known as: PROTONIX Take 1 tablet (40 mg total) by mouth daily. What changed:  when to take this reasons to take this   senna-docusate 8.6-50 MG tablet Commonly known as: Senokot-S Take 1 tablet by mouth 2 (two) times daily.   tamsulosin 0.4 MG Caps capsule Commonly known as: FLOMAX Take 1 capsule (0.4 mg total) by mouth daily after supper.        ASK your doctor about these medications    sodium zirconium cyclosilicate 10 g Pack packet Commonly known as: LOKELMA Take 10 g by mouth once for 1 dose. Ask about: Should I take this medication?        Follow-up Information     Lovorn, Jinny Blossom, MD Follow up.   Specialty: Physical Medicine and Rehabilitation Why: office will call you to arrange your appt (sent) Contact information: [address] [place] [address] [phone]         [name], MD Follow up.   Specialty: Family Medicine Why: Call the office in 1-2 days to make arrangements for hospital follow-up appointment with blood work. Contact information: [place] [address] [phone]         [facility] Follow up.   Why: Call  the office in 1-2 days to make arrangements for hospital follow-up appointment. Contact information: [address]     [place] [phone]                Signed: Barbie Banner 11/07/2022, 2:46 PM

## 2022-10-03 NOTE — Progress Notes (Signed)
PROGRESS NOTE   Subjective/Complaints: Pt reports pain is good this AM in feet/hands- the tingling/burning a littl ebetter.  Ordered a new breakfast- since something off on hers this AM.   Says pulling up in bed makes her shoulders hurt- was wondering if could come up with another technique Still has foley-  Also LBM 2 days ago- wanted ot make sure has her 1 tab Senna daily- which she does.     ROS:  Pt denies SOB, abd pain, CP, N/V/ (+)C/D, and vision changes Except for HPI  Objective:   No results found. No results for input(s): "WBC", "HGB", "HCT", "PLT" in the last 72 hours. Recent Labs    10/02/22 1101 10/03/22 0633  NA 133* 133*  K 4.3 4.8  CL 98 97*  CO2 27 24  GLUCOSE 110* 104*  BUN 16 18  CREATININE 1.06* 1.02*  CALCIUM 8.5* 8.9    Intake/Output Summary (Last 24 hours) at 10/03/2022 0833 Last data filed at 10/03/2022 0721 Gross per 24 hour  Intake 118 ml  Output 1850 ml  Net -1732 ml        Physical Exam: Vital Signs Blood pressure (!) 146/77, pulse 84, temperature 98 F (36.7 C), resp. rate 18, height '5\' 4"'$  (1.626 m), weight 123 kg, last menstrual period 08/17/2012, SpO2 97 %.    General: awake, alert, appropriate, BMI 46.55; sitting up in bed; NAD HENT: conjugate gaze; oropharynx moist CV: regular rate and rhythm; no JVD Pulmonary: CTA B/L; no W/R/R- good air movement GI: soft, NT, ND, (+)BS; hypoactive - protuberant Psychiatric: appropriate- appears more sad/flat this AM- pt denies Neurological: Ox3 MS: UE strength 4/5 except distal is 4-/5 LE's- HF 2/5; KE 3+/5; DF 3+/5 and PF 4-/5 B/L  Skin: multiple IV's and blood draw tape areas- look OK Assessment/Plan: 1. Functional deficits which require 3+ hours per day of interdisciplinary therapy in a comprehensive inpatient rehab setting. Physiatrist is providing close team supervision and 24 hour management of active medical problems listed  below. Physiatrist and rehab team continue to assess barriers to discharge/monitor patient progress toward functional and medical goals  Care Tool:  Bathing              Bathing assist       Upper Body Dressing/Undressing Upper body dressing        Upper body assist      Lower Body Dressing/Undressing Lower body dressing            Lower body assist       Toileting Toileting    Toileting assist       Transfers Chair/bed transfer  Transfers assist           Locomotion Ambulation   Ambulation assist              Walk 10 feet activity   Assist           Walk 50 feet activity   Assist           Walk 150 feet activity   Assist           Walk 10 feet on uneven surface  activity  Assist           Wheelchair     Assist               Wheelchair 50 feet with 2 turns activity    Assist            Wheelchair 150 feet activity     Assist          Blood pressure (!) 146/77, pulse 84, temperature 98 F (36.7 C), resp. rate 18, height '5\' 4"'$  (1.626 m), weight 123 kg, last menstrual period 08/17/2012, SpO2 97 %.  Medical Problem List and Plan: 1. Functional deficits secondary to GBS             -patient may shower             -ELOS/Goals: 2-3 weeks             Admit to CIR  First day of evaluations- Con't CIR, PT and OT- pt asking if there's some other way to get her up in bed without pulling up on bed- hurts her shoulders-  2.  Antithrombotics: -DVT/anticoagulation:  Pharmaceutical: Lovenox start 1/17             -antiplatelet therapy: none   3. Pain Management: continue Tylenol as needed as well as Dilaudid 4 mg q6 hours prn and gabapentin 600 mg TID  4. Mood/Behavior/Sleep: LCSW to evaluate and provide emotional support             -antipsychotic agents: n/a   5. Neuropsych/cognition: This patient is capable of making decisions on her own behalf.   6. Skin/Wound Care: Routine skin  care checks   7. Fluids/Electrolytes/Nutrition: Routine Is and Os and follow-up chemistries             -protein supplements for hypoalbuminemia             -continue folic acid, N82             -continue MVI   1/18- will check with pharmacy if needs B12 shots AND daily pills.  8: Chronic back pain/radiculopathy:  -continue Dilaudid 4 mg q 6 hours - is her home medicine (PTA per Dr. [name])             -continue gabapentin 600 mg TID             -continue Lidoderm patch daily             -continue Flexeril 10 mg TID prn   9: Urinary retention: Foley replaced on 1/15             -treated for UTI with Rocephin for  7 days   1/18- will start Flomax 0.4 mg qsupper and will try and remove Foley in AM- explained will need in/out caths likely before bladder muscle kicks in and she can void.   10: Hypertension: monitor TID and prn             -continue Lasix 40 mg daily             -continue Lopressor 50 mg BID             -add magnesium gluconate '250mg'$  HS   11: GERD?/GI prophylaxis: continue Protonix   12: B12 deficiency: continue supplementation   13: Class 3 obesity: BMI = 48; weight loss counseling   14: Hyponatremia, mild: follow-up BMP   15: Elevated serum creatinine; 0.75 1/16 to 1.06 on 1/17             -  follow-up BMP   1/18- stable- con't to monitor weekly and prn 16: GBS: continue to monitor NIFs/VIC q 12 hours             -continue gabapentin   17: Glaucoma: continue latanoprost eye drops   18: Tobacco use: cessation counseling   19. Constipation  1/18- will con't Senna per pt request- to not increase- but if no BM by tomorrow, will try Sorbitol.    I spent a total of 52   minutes on total care today- >50% coordination of care- due to  D/w nursing about B12 shots, Flomax, Foley and OT about pulling up in bed- to see if can come up with a way to get around hurting shoulders.   Also educated pt about Urinary retention and will take time- getting foley out reduces  risk of UTI as well as allows bladder, as muscle to start to work again.     LOS: 1 days A FACE TO FACE EVALUATION WAS PERFORMED  Nga Rabon 10/03/2022, 8:33 AM

## 2022-10-03 NOTE — Evaluation (Signed)
Occupational Therapy Assessment and Plan  Patient Details  Name: Dana [name] MRN: [id number] Date of Birth: [birth year]  OT Diagnosis: abnormal posture, ataxia, muscle weakness (generalized), and chronic pain  Rehab Potential: Rehab Potential (ACUTE ONLY): Fair ELOS: 3 weeks   Today's Date: 10/03/2022 OT Individual Time: 1st Session [phone], 2nd Session  -  OT Individual Time Calculation (min): 60 min, 73 min     Hospital Problem: Principal Problem:   Guillain Barr syndrome [facility])   Past Medical History:  Past Medical History:  Diagnosis Date   Anxiety    Back pain with radiation    Breast mass, right    Chronic pain    Chronic, continuous use of opioids    Complication of anesthesia 04/07/14   Allergic reaction to Lisinopril immediately following surgery   DJD (degenerative joint disease)    Fibromyalgia    Groin abscess    Headache(784.0)    History of IBS    Hypercholesterolemia    IBS (irritable bowel syndrome)    Lactose intolerance    Mild hypertension    Obesity    Tobacco use disorder    Umbilical hernia    Symptomatic   Past Surgical History:  Past Surgical History:  Procedure Laterality Date   ABDOMINAL HYSTERECTOMY     ANTERIOR CERVICAL DECOMP/DISCECTOMY FUSION N/A 12/20/2015   Procedure: ANTERIOR CERVICAL DECOMPRESSION FUSION CERVICAL 4-5, CERVICAL 5-6, CERVICAL 6-7 WITH INSTRUMENTATION AND ALLOGRAFT;  Surgeon: Phylliss Bob, MD;  Location: [place];  Service: Orthopedics;  Laterality: N/A;  Anterior cervical decompression fusion, cervical 4-5, cervical 5-6, cervical 6-7 with instrumentation and allograft   BACK SURGERY     BILATERAL SALPINGECTOMY  09/03/2012   Procedure: BILATERAL SALPINGECTOMY;  Surgeon: [name], MD;  Location: [place] [facility];  Service: Gynecology;  Laterality: Bilateral;   BREAST BIOPSY Right 04/07/2014   Procedure: REMOVAL RIGHT BREAST MASS WITH WIRE LOCALIZATION;  Surgeon: [name], MD;  Location: [place];  Service: General;   Laterality: Right;   BREAST EXCISIONAL BIOPSY Left    x2   BREAST LUMPECTOMY     x2   CARPAL TUNNEL RELEASE Left 05/11/2019   Procedure: LEFT CARPAL TUNNEL RELEASE, RIGHT TENNIS ELBOW MARCAINE/DEPO MEDROL INJECTION UNDER ANESTHESIA;  Surgeon: Jessy Oto, MD;  Location: [place];  Service: Orthopedics;  Laterality: Left;   COLONOSCOPY W/ BIOPSIES  04/24/2012   per Dr. Carlean Purl, clear, repeat in 10 yrs    disectomy     ESOPHAGOGASTRODUODENOSCOPY     FINGER SURGERY     Right index-excision of mass    FOOT SURGERY Right    Bone Spurs   LAPAROSCOPIC HYSTERECTOMY  09/03/2012   Procedure: HYSTERECTOMY TOTAL LAPAROSCOPIC;  Surgeon: [name], MD;  Location: [place] [facility];  Service: Gynecology;  Laterality: N/A;   LUMBAR DISC SURGERY     TUBAL LIGATION     UMBILICAL HERNIA REPAIR N/A 05/01/2018   Procedure: UMBILICAL HERNIA REPAIR;  Surgeon: Judeth Horn, MD;  Location: [place];  Service: General;  Laterality: N/A;    Assessment & Plan Clinical Impression: 57 year old female who presented to the [facility] ED on 09/23/2022 with bilateral lower extremity weakness with decreased mobility that has progressed to both arms. She also reported numbness and tingling of the extremities. She was transferred to [facility] for MRI. 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 Albuminocytologic dissociation on LP under fluoro doen one 1/10. She received IVIG for 2 g/kg daily divided over 5 days. NIF's/VIC  every 12 hours continue. Foley catheter placed 1/10 and urine culture obtained positive for E. coli and Klebsiella pneumoniae. She was treated with Rocephin for 7 days. Foley catheter was discontinued, however she developed urinary retention and Foley catheter was placed on 1/15. Home medications continued for chronic back pain/radiculopathy, hypertension, acid reflux, B12 deficiency, glaucoma. She has had improvement in extremity weakness. Last NIF -40 and VC 2.2L performed on 1/14. She is tolerating a  regular diet.The patient requires inpatient physical medicine and rehabilitation evaluations and treatment secondary to dysfunction due to Gilliam-Barr syndrome.   Patient currently requires total with basic self-care skills and IADL secondary to muscle weakness, muscle joint tightness, and muscle paralysis, decreased cardiorespiratoy endurance, impaired timing and sequencing, abnormal tone, unbalanced muscle activation, ataxia, decreased coordination, and decreased motor planning, and decreased sitting balance, decreased standing balance, decreased postural control, and decreased balance strategies.  Prior to hospitalization, patient could complete A/IADL's, amb and driving with modified independent .  Patient will benefit from skilled intervention to decrease level of assist with basic self-care skills, increase independence with basic self-care skills, and increase level of independence with iADL prior to discharge home with care partner.  Anticipate patient will require minimal physical assistance and follow up home health.  OT - End of Session Activity Tolerance: Decreased this session Endurance Deficit: Yes OT Assessment Rehab Potential (ACUTE ONLY): Fair OT Barriers to Discharge: Home environment access/layout;Neurogenic Bowel & Bladder OT Barriers to Discharge Comments: bathroom accessibility for w/c and walker, new Foley due to UR OT Patient demonstrates impairments in the following area(s): Balance;Endurance;Behavior;Motor;Sensory;Safety;Other (Comment);Pain (word finding deficits) OT Basic ADL's Functional Problem(s): Grooming;Bathing;Dressing;Toileting OT Advanced ADL's Functional Problem(s): Simple Meal Preparation;Full Meal Preparation OT Transfers Functional Problem(s): Toilet;Tub/Shower OT Additional Impairment(s): Fuctional Use of Upper Extremity OT Plan OT Intensity: Minimum of 1-2 x/day, 45 to 90 minutes OT Frequency: 5 out of 7 days OT Duration/Estimated Length of Stay: 3  weeks OT Treatment/Interventions: Balance/vestibular training;Community reintegration;Discharge planning;Functional electrical stimulation;Functional mobility training;DME/adaptive equipment instruction;Neuromuscular re-education;Pain management;Patient/family education;Psychosocial support;Self Care/advanced ADL retraining;Skin care/wound managment;Therapeutic Activities;Splinting/orthotics;Therapeutic Exercise;UE/LE Strength taining/ROM;UE/LE Coordination activities;Wheelchair propulsion/positioning (expressive language with word finding deficits) OT Self Feeding Anticipated Outcome(s): indep OT Basic Self-Care Anticipated Outcome(s): mod I for w/c/seated level, min A standing OT Toileting Anticipated Outcome(s): min A OT Bathroom Transfers Anticipated Outcome(s): min A OT Recommendation Recommendations for Other Services: Speech consult;Neuropsych consult;Therapeutic Recreation consult Therapeutic Recreation Interventions: Stress management;Kitchen group;Outing/community reintergration;Pet therapy Patient destination: Home Follow Up Recommendations: Home health OT Equipment Recommended: 3 in 1 bedside comode;Rolling walker with 5" wheels;Tub/shower bench;Wheelchair (measurements);Wheelchair cushion (measurements) Equipment Details: w/c, cushion, RW, TTB, HHOT, 3 in 1 commode   OT Evaluation Precautions/Restrictions  Precautions Precautions: Fall Precaution Comments: weakness, numbness L anterior thigh. Restrictions Weight Bearing Restrictions: No General Chart Reviewed: Yes Family/Caregiver Present: Yes Vital Signs Therapy Vitals Temp: 97.7 F (36.5 C) Pulse Rate: 88 Resp: 18 BP: 135/82 Patient Position (if appropriate): Lying Oxygen Therapy SpO2: 97 % O2 Device: Room Air Pain Pain Assessment Pain Score: 3  Pain Type: Chronic pain Pain Location: Back Pain Orientation: Right;Left Pain Descriptors / Indicators: Aching Pain Onset: On-going Patients Stated Pain Goal:  1 Pain Intervention(s): Pain med given for lower pain score than stated, per patient request;Repositioned;Rest Multiple Pain Sites: No Home Living/Prior Functioning Home Living Family/patient expects to be discharged to:: Private residence Living Arrangements: Spouse/significant other Available Help at Discharge: Family, Available 24 hours/day Type of Home: House Home Access: Ramped entrance Home Layout: One level Bathroom Shower/Tub: Research officer, trade union  Toilet: Standard Bathroom Accessibility: Yes  Lives With: Spouse, Other (Comment) IADL History Homemaking Responsibilities: Yes Meal Prep Responsibility: Secondary Laundry Responsibility: Secondary Cleaning Responsibility: Secondary Shopping Responsibility: Secondary Current License: Yes Mode of Transportation: Car Occupation: On disability Type of Occupation: disability due to chronic pain Leisure and Hobbies: music, tv IADL Comments: husband and MIL assist Prior Function Level of Independence: Independent with transfers, Requires assistive device for independence, Independent with gait, Independent with basic ADLs, Independent with homemaking with ambulation  Able to Take Stairs?: No Driving: Yes Vocation: On disability Vision Baseline Vision/History: 1 Wears glasses Ability to See in Adequate Light: 0 Adequate Patient Visual Report: No change from baseline Vision Assessment?: No apparent visual deficits Perception  Perception: Within Functional Limits Praxis Praxis: Intact Cognition Cognition Overall Cognitive Status: Within Functional Limits for tasks assessed Arousal/Alertness: Awake/alert Memory: Appears intact Awareness: Appears intact Problem Solving: Appears intact Behaviors: Other (comment) (anxiety-like symptoms) Comments: anxious about standing outside of Stedy Brief Interview for Mental Status (BIMS) Repetition of Three Words (First Attempt): 3 Temporal Orientation: Year: Correct Temporal  Orientation: Month: Accurate within 5 days Temporal Orientation: Day: Correct Recall: "Sock": Yes, no cue required Recall: "Blue": Yes, no cue required Recall: "Bed": Yes, no cue required BIMS Summary Score: 15 Sensation Sensation Light Touch: Impaired Detail Peripheral sensation comments: absent anterior L thigh, inconsistent B feet, B finger tips Light Touch Impaired Details: Impaired LLE;Impaired RUE;Impaired LUE;Impaired RLE Hot/Cold: Appears Intact Proprioception: Appears Intact Stereognosis: Appears Intact Coordination Gross Motor Movements are Fluid and Coordinated: No Fine Motor Movements are Fluid and Coordinated: No Coordination and Movement Description: ataxic, dysmetric Finger Nose Finger Test: impaired with slowness and overshooting Bly Heel Shin Test: able to perform R to L shin, unable Left. 9 Hole Peg Test: TBA Motor  Motor Motor: Abnormal postural alignment and control;Other (comment) Motor - Skilled Clinical Observations: weakness B LES, trnk and B UE weakness  Trunk/Postural Assessment  Cervical Assessment Cervical Assessment: Exceptions to [facility] Postural Control Postural Control: Deficits on evaluation Righting Reactions: impainred Protective Responses: delayed Postural Limitations: core strength deficits  Balance Balance Balance Assessed: Yes Static Sitting Balance Static Sitting - Balance Support: Feet supported Static Sitting - Level of Assistance: 5: Stand by assistance Static Standing Balance Static Standing - Balance Support: Bilateral upper extremity supported Static Standing - Level of Assistance: 3: Mod assist Extremity/Trunk Assessment RUE Assessment RUE Assessment: Exceptions to [facility] General Strength Comments: 4-/5 RUE Strength Right Hand Grip (lbs): 60 lbs (norm for gender and age 21 lbs) LUE Assessment LUE Assessment: Exceptions to [facility] General Strength Comments: 4-/5 LUE Strength Left Hand Grip (lbs): 45 lbs (norm for age and gender 81  lbs)  Care Tool Care Tool Self Care   10/03/22 2021  CareTool - Eating  Eating Assist Level Set up assist  CareTool - Oral Care  Oral Care Assist Level Minimal Assistance - Patient > 75%  CareTool - Bathing  Body parts bathed by patient Right arm;Left arm;Chest;Face  Body parts bathed by helper Abdomen;Front perineal area;Buttocks;Right upper leg;Left upper leg;Right lower leg;Left lower leg  Assist Level Dependent - Patient 0%  CareTool- Upper Body Dressing (including orthotics  What is the patient wearing? Pull over shirt  Assist Level Moderate Assistance - Patient 50 - 74%  CareTool - Lower Body Dressing (excluding footwear)  What is the patient wearing? Pants  Assist for lower body dressing Dependent - Patient 0%  CareTool - Putting on/Taking off footwear  What is the patient wearing? Non-skid slipper socks  Assist for footwear Dependent - Patient 0%    Care Tool Bed Mobility Roll left and right activity   Roll left and right assist level: Minimal Assistance - Patient > 75%    Sit to lying activity   Sit to lying assist level: Moderate Assistance - Patient 50 - 74%    Lying to sitting on side of bed activity   Lying to sitting on side of bed assist level: the ability to move from lying on the back to sitting on the side of the bed with no back support.: Moderate Assistance - Patient 50 - 74%       10/03/22 1941  CareTool - Bed Mobility  Roll left and right assist level Minimal Assistance - Patient > 75%  Sit to lying assist level Moderate Assistance - Patient 50 - 74%  Lying to sitting on side of bed assist level: the ability to move from lying on the back to sitting on the side of the bed with no back support. Moderate Assistance - Patient 50 - 74%  CareTool - Sit to stand transfer  Sit to stand assist level Maximal Assistance - Patient 25 - 49%  CareTool - Chair/bed transfer  Chair/bed transfer assist level Dependent - mechanical lift  CareTool - Toilet Transfers   Assist Level 2 Helpers   Car  10/03/22 2022  CareTool - Toileting  Assist for toileting 2 Helpers  e Tool Cognition  Expression of Ideas and Wants    Understanding Verbal and Non-Verbal Content     Memory/Recall Ability     Refer to Care Plan for Long Term Goals  SHORT TERM GOAL WEEK 1 OT Short Term Goal 1 (Week 1): Pt will power up to stand at grab bar or RW with mod A during LB dressing OT Short Term Goal 2 (Week 1): Pt will complete grooming and UB self care routine seated sink side with set up OT Short Term Goal 3 (Week 1): Pt will don LB garments over feet with AE with min a  Recommendations for other services: TR, Neuropsychology, ST for word finding/expression deficits   Skilled Therapeutic Intervention ADL ADL Equipment Provided: Long-handled sponge Eating: Set up Where Assessed-Eating: Wheelchair Grooming: Minimal assistance Where Assessed-Grooming: Wheelchair;Sitting at sink Upper Body Bathing: Moderate assistance Where Assessed-Upper Body Bathing: Sitting at sink;Wheelchair Lower Body Bathing: Dependent Where Assessed-Lower Body Bathing: Sitting at sink Upper Body Dressing: Minimal assistance Where Assessed-Upper Body Dressing: Sitting at sink;Wheelchair Lower Body Dressing: Dependent Where Assessed-Lower Body Dressing: Wheelchair Toileting: Dependent Where Assessed-Toileting: Toilet;Bedside Commode Toilet Transfer: Dependent Toilet Transfer Method: Other (comment) (STEDY lift x 2) Tub/Shower Transfer: Unable to assess Social research officer, government: Dependent Social research officer, government Method: Other (comment) (Stedy lift x 2) ADL Comments: Pt with severe LB weakness >UB weakness. Use of STEDY OOB to 3 in 1 over toilet then sink side bathing in STEDY. Has Foley cath for voiding and max A for peri and buttocks hygeine as unable to stand or manage care seated due to weakness and body habitus Mobility  Bed Mobility Bed Mobility: Rolling Right;Rolling Left;Sit to  Sidelying Right;Right Sidelying to Sit Rolling Right: Minimal Assistance - Patient > 75% Rolling Left: Minimal Assistance - Patient > 75% Right Sidelying to Sit: Moderate Assistance - Patient 50-74% Sit to Sidelying Right: Moderate Assistance - Patient 50-74% Transfers Sit to Stand: Moderate Assistance - Patient 50-74% Stand to Sit: Moderate Assistance - Patient 50-74%   Discharge Criteria: Patient will be discharged from OT if patient refuses treatment  3 consecutive times without medical reason, if treatment goals not met, if there is a change in medical status, if patient makes no progress towards goals or if patient is discharged from hospital.  The above assessment, treatment plan, treatment alternatives and goals were discussed and mutually agreed upon: by patient and by family  OT Treatment and Interventions:  1st Session: Pt seen for full initial OT evaluation and training session this pm. Pt in bed calling nursing for toileting  upon OT arrival for session.  OT introduced role of therapy and purpose of session. Pt positive, eager and open to all treatment however exhibits anxiety-like signs during transfers with some fear expressed. Pt able to express with some increased time  at times. Assessment conducted bed, STEDY, and w/c level throughout session. Pt open to completing toileting, toilet and w/c level bathing, dressing and grooming assessment and training. Pt with severe LB weakness >UB weakness. Use of STEDY OOB to 3 in 1 over toilet then sink side bathing in STEDY. Has Foley cath for voiding and max A for peri and buttocks hygeine as unable to stand or manage care seated due to weakness and body habitus. Pt left at end of session with w/c alarm set, tray table and nurse call bell within reach and with PT arrival for their assessment.   2nd Session:   Pt in bed upon OT arrival with dtr present bedside. OT assisted to EOB for grip strength assessment, seated balance training and  progression of simple UE strengthening. Pt with low pain in back but able to tolerate EOB for up to 10 min with support and rest x 1 propped with pillows behind back. Required mod-max A supine to sit with bed features.  Issued and trained in light foam cube for hand strength, light yellow tband for 10 reps x 2 sets triceps press, and yellow tputty for grasp. Pt reports having some word retrieval deficits thus ST added for strategies. Pt returned to supine with D lift of B LE's up onto bed and with bed features to pull up the bed for poper upright positioning. OT left pt with bed exit engaged, needs and nurse call button in reach.   Barnabas Lister 10/03/2022, 8:22 PM

## 2022-10-03 NOTE — Progress Notes (Signed)
Inpatient Rehabilitation Care Coordinator Assessment and Plan Patient Details  Name: Dana [name] MRN: [id number] Date of Birth: 10/01/1965  Today's Date: 10/03/2022  Hospital Problems: Principal Problem:   Guillain Barr syndrome [facility])  Past Medical History:  Past Medical History:  Diagnosis Date   Anxiety    Back pain with radiation    Breast mass, right    Chronic pain    Chronic, continuous use of opioids    Complication of anesthesia 04/07/14   Allergic reaction to Lisinopril immediately following surgery   DJD (degenerative joint disease)    Fibromyalgia    Groin abscess    Headache(784.0)    History of IBS    Hypercholesterolemia    IBS (irritable bowel syndrome)    Lactose intolerance    Mild hypertension    Obesity    Tobacco use disorder    Umbilical hernia    Symptomatic   Past Surgical History:  Past Surgical History:  Procedure Laterality Date   ABDOMINAL HYSTERECTOMY     ANTERIOR CERVICAL DECOMP/DISCECTOMY FUSION N/A 12/20/2015   Procedure: ANTERIOR CERVICAL DECOMPRESSION FUSION CERVICAL 4-5, CERVICAL 5-6, CERVICAL 6-7 WITH INSTRUMENTATION AND ALLOGRAFT;  Surgeon: Phylliss Bob, MD;  Location: [place];  Service: Orthopedics;  Laterality: N/A;  Anterior cervical decompression fusion, cervical 4-5, cervical 5-6, cervical 6-7 with instrumentation and allograft   BACK SURGERY     BILATERAL SALPINGECTOMY  09/03/2012   Procedure: BILATERAL SALPINGECTOMY;  Surgeon: [name], MD;  Location: [facility];  Service: Gynecology;  Laterality: Bilateral;   BREAST BIOPSY Right 04/07/2014   Procedure: REMOVAL RIGHT BREAST MASS WITH WIRE LOCALIZATION;  Surgeon: [name], MD;  Location: [place];  Service: General;  Laterality: Right;   BREAST EXCISIONAL BIOPSY Left    x2   BREAST LUMPECTOMY     x2   CARPAL TUNNEL RELEASE Left 05/11/2019   Procedure: LEFT CARPAL TUNNEL RELEASE, RIGHT TENNIS ELBOW MARCAINE/DEPO MEDROL INJECTION UNDER ANESTHESIA;  Surgeon: Jessy Oto, MD;  Location: [facility];  Service: Orthopedics;  Laterality: Left;   COLONOSCOPY W/ BIOPSIES  04/24/2012   per Dr. Carlean Purl, clear, repeat in 10 yrs    disectomy     ESOPHAGOGASTRODUODENOSCOPY     FINGER SURGERY     Right index-excision of mass    FOOT SURGERY Right    Bone Spurs   LAPAROSCOPIC HYSTERECTOMY  09/03/2012   Procedure: HYSTERECTOMY TOTAL LAPAROSCOPIC;  Surgeon: [name], MD;  Location: [facility] [facility];  Service: Gynecology;  Laterality: N/A;   LUMBAR DISC SURGERY     TUBAL LIGATION     UMBILICAL HERNIA REPAIR N/A 05/01/2018   Procedure: UMBILICAL HERNIA REPAIR;  Surgeon: Judeth Horn, MD;  Location: [facility];  Service: General;  Laterality: N/A;   Social History:  reports that she has quit smoking. Her smoking use included cigarettes. She has never used smokeless tobacco. She reports that she does not currently use alcohol. She reports current drug use.  Family / Support Systems Marital Status: Married Patient Roles: Spouse, Parent, Other (Comment) (retiree and daughter in-law) Spouse/Significant Other: Gwyndolyn Saxon [phone] Children: Latasha-daughter [phone] three other grown children Other Supports: Mother in-law whom lives with pt and husband Anticipated Caregiver: Husband and grwon children Ability/Limitations of Caregiver: Husband works and children do also. All will work together on the care for pt Caregiver Availability: 24/7 Family Dynamics: Close knit family who pull together when one is in need, especially their [place]. Between family, extended family and friends pt will have  the care she needs.  Social History Preferred language: English Religion: Non-Denominational Cultural Background: No issues Education: HS Health Literacy - How often do you need to have someone help you when you read instructions, pamphlets, or other written material from your doctor or pharmacy?: Never Writes: Yes Employment Status: Disabled Public relations account executive Issues: No  issues Guardian/Conservator: None-according to MD pt is capable of making her own decisions while here   Abuse/Neglect Abuse/Neglect Assessment Can Be Completed: Yes Physical Abuse: Denies Verbal Abuse: Denies Sexual Abuse: Denies Exploitation of patient/patient's resources: Denies Self-Neglect: Denies  Patient response to: Social Isolation - How often do you feel lonely or isolated from those around you?: Never  Emotional Status Pt's affect, behavior and adjustment status: Pt is motivated to do well and recover from this, she still would like to know how she got GBS but is looking forward and focusing on her progress. She is very optimistic and positive. Recent Psychosocial Issues: other health issues-thought were being managed prior to this hospitalization Psychiatric History: History of anxiety takes medications for this and finds it helpful. Will ask neuro-psych to see to assist her with coping while here. Substance Abuse History: No issues  Patient / Family Perceptions, Expectations & Goals Pt/Family understanding of illness & functional limitations: Pt can explain her condition and deficits, both she and husband talk with the MD and feel they have a good understanding of her plan moving forward. Both want to know what caused this so as to prevent it from happening again. Premorbid pt/family roles/activities: Wife, mom, grandmother, daughter in-law sister, church member, etc Anticipated changes in roles/activities/participation: resume Pt/family expectations/goals: Pt states: " I want to be able to take care of myself when I leave if I can."  [employer]: None Premorbid Home Care/DME Agencies: Other (Comment) (rollator, shower seat, cane) Transportation available at discharge: Husband and family Is the patient able to respond to transportation needs?: Yes In the past 12 months, has lack of transportation kept you from medical appointments or from getting  medications?: No In the past 12 months, has lack of transportation kept you from meetings, work, or from getting things needed for daily living?: No Resource referrals recommended: Neuropsychology  Discharge Planning Living Arrangements: Spouse/significant other Support Systems: Spouse/significant other, Children, Other relatives, Friends/neighbors, Church/faith community Type of Residence: Private residence Insurance Resources: Multimedia programmer (specify) Sports administrator) Financial Resources: SSD, Family Support Financial Screen Referred: No Living Expenses: Medical laboratory scientific officer Management: Patient, Spouse Does the patient have any problems obtaining your medications?: No Home Management: self and husband Patient/Family Preliminary Plans: Return home with husband and mother in-law, husband does work outside of the home but is home two days per week. Their children will also be assisting when they can and all will need to work out a plan if pt requires care at discharge. Aware being evaluated today with therapies Care Coordinator Barriers to Discharge: Insurance for SNF coverage Care Coordinator Anticipated Follow Up Needs: HH/OP  Clinical Impression Pleasant female who is motivated to do well and very positive and glad to be here. Her husband and children are very involved and supportive. Aware being evaluated today and goals being set. Will place on neuro-psych list to be seen when returns. Work on discharge needs and continued support  Elease Hashimoto 10/03/2022, 11:46 AM

## 2022-10-03 NOTE — Evaluation (Signed)
Physical Therapy Assessment and Plan  Patient Details  Name: Dana [name] MRN: [id number] Date of Birth: November 18, 1965  PT Diagnosis: Abnormality of gait, Difficulty walking, Hypotonia, Impaired sensation, and Muscle weakness Rehab Potential: Good ELOS: 3 weeks   Today's Date: 10/03/2022 PT Individual Time: 1000-1115 PT Individual Time Calculation (min): 75 min    Hospital Problem: Principal Problem:   Guillain Barr syndrome [facility])   Past Medical History:  Past Medical History:  Diagnosis Date   Anxiety    Back pain with radiation    Breast mass, right    Chronic pain    Chronic, continuous use of opioids    Complication of anesthesia 04/07/14   Allergic reaction to Lisinopril immediately following surgery   DJD (degenerative joint disease)    Fibromyalgia    Groin abscess    Headache(784.0)    History of IBS    Hypercholesterolemia    IBS (irritable bowel syndrome)    Lactose intolerance    Mild hypertension    Obesity    Tobacco use disorder    Umbilical hernia    Symptomatic   Past Surgical History:  Past Surgical History:  Procedure Laterality Date   ABDOMINAL HYSTERECTOMY     ANTERIOR CERVICAL DECOMP/DISCECTOMY FUSION N/A 12/20/2015   Procedure: ANTERIOR CERVICAL DECOMPRESSION FUSION CERVICAL 4-5, CERVICAL 5-6, CERVICAL 6-7 WITH INSTRUMENTATION AND ALLOGRAFT;  Surgeon: Phylliss Bob, MD;  Location: [place];  Service: Orthopedics;  Laterality: N/A;  Anterior cervical decompression fusion, cervical 4-5, cervical 5-6, cervical 6-7 with instrumentation and allograft   BACK SURGERY     BILATERAL SALPINGECTOMY  09/03/2012   Procedure: BILATERAL SALPINGECTOMY;  Surgeon: [name], MD;  Location: [place] [facility];  Service: Gynecology;  Laterality: Bilateral;   BREAST BIOPSY Right 04/07/2014   Procedure: REMOVAL RIGHT BREAST MASS WITH WIRE LOCALIZATION;  Surgeon: [name], MD;  Location: [place];  Service: General;  Laterality: Right;   BREAST EXCISIONAL BIOPSY Left     x2   BREAST LUMPECTOMY     x2   CARPAL TUNNEL RELEASE Left 05/11/2019   Procedure: LEFT CARPAL TUNNEL RELEASE, RIGHT TENNIS ELBOW MARCAINE/DEPO MEDROL INJECTION UNDER ANESTHESIA;  Surgeon: Jessy Oto, MD;  Location: [place];  Service: Orthopedics;  Laterality: Left;   COLONOSCOPY W/ BIOPSIES  04/24/2012   per Dr. Carlean Purl, clear, repeat in 10 yrs    disectomy     ESOPHAGOGASTRODUODENOSCOPY     FINGER SURGERY     Right index-excision of mass    FOOT SURGERY Right    Bone Spurs   LAPAROSCOPIC HYSTERECTOMY  09/03/2012   Procedure: HYSTERECTOMY TOTAL LAPAROSCOPIC;  Surgeon: [name], MD;  Location: [place] [facility];  Service: Gynecology;  Laterality: N/A;   LUMBAR DISC SURGERY     TUBAL LIGATION     UMBILICAL HERNIA REPAIR N/A 05/01/2018   Procedure: UMBILICAL HERNIA REPAIR;  Surgeon: Judeth Horn, MD;  Location: [place];  Service: General;  Laterality: N/A;    Assessment & Plan Clinical Impression: Dana [name] is a 57 year old female who presented to the [facility] ED on 09/23/2022 with bilateral lower extremity weakness with decreased mobility that has progressed to both arms. She also reported numbness and tingling of the extremities. She was transferred to [facility] for MRI. 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 Albuminocytologic dissociation on LP under fluoro doen one 1/10. She received IVIG for 2 g/kg daily divided over 5 days. NIF's/VIC every 12 hours continue. Foley catheter placed 1/10 and  urine culture obtained positive for E. coli and Klebsiella pneumoniae. She was treated with Rocephin for 7 days. Foley catheter was discontinued, however she developed urinary retention and Foley catheter was placed on 1/15. Home medications continued for chronic back pain/radiculopathy, hypertension, acid reflux, B12 deficiency, glaucoma. She has had improvement in extremity weakness. Last NIF -40 and VC 2.2L performed on 1/14. She is tolerating a regular diet.The patient requires  inpatient physical medicine and rehabilitation evaluations and treatment secondary to dysfunction due to Gilliam-Barr syndrome.   Patient currently requires max with mobility secondary to muscle weakness and muscle paralysis, abnormal tone and decreased coordination, and decreased standing balance, decreased postural control, and decreased balance strategies.  Prior to hospitalization, patient was modified independent  with mobility and lived with Spouse, Other (Comment) (MIL who needs care) in a House home.  Home access is  Ramped entrance.  Patient will benefit from skilled PT intervention to maximize safe functional mobility, minimize fall risk, and decrease caregiver burden for planned discharge home with 24 hour supervision.  Anticipate patient will benefit from follow up [place] at discharge.  PT - End of Session Activity Tolerance: Tolerates 10 - 20 min activity with multiple rests Endurance Deficit: Yes PT Assessment Rehab Potential (ACUTE/IP ONLY): Good PT Barriers to Discharge: Decreased caregiver support PT Patient demonstrates impairments in the following area(s): Balance;Safety;Edema;Sensory;Endurance;Motor PT Transfers Functional Problem(s): Bed Mobility;Bed to Chair;Car;Furniture PT Locomotion Functional Problem(s): Ambulation;Wheelchair Mobility PT Plan PT Intensity: Minimum of 1-2 x/day ,45 to 90 minutes PT Frequency: 5 out of 7 days PT Duration Estimated Length of Stay: 3 weeks PT Treatment/Interventions: Ambulation/gait training;Community reintegration;Neuromuscular re-education;UE/LE Strength taining/ROM;Wheelchair propulsion/positioning;Discharge planning;Balance/vestibular training;Therapeutic Activities;UE/LE Coordination activities;Functional mobility training;Patient/family education;Therapeutic Exercise PT Transfers Anticipated Outcome(s): CGA PT Locomotion Anticipated Outcome(s): Min/CGA w/ LRAD PT Recommendation Recommendations for Other Services: Neuropsych consult (Pt  somewhat teary during eval re: dx and prognosis.) Follow Up Recommendations: Home health PT Patient destination: Home Equipment Recommended: To be determined Equipment Details: pt has rollator (not using), [address] [facility]   PT Evaluation Precautions/Restrictions Precautions Precautions: Fall Precaution Comments: weakness, numbness L anterior thigh. Restrictions Weight Bearing Restrictions: No General Chart Reviewed: Yes Family/Caregiver Present: No Vital Signs Pain Pain Assessment Pain Scale: 0-10 Pain Score: 3  Pain Type: Neuropathic pain;Other (Comment) Pain Location: Foot Pain Orientation: Right;Left Pain Descriptors / Indicators: Burning Pain Interference Pain Interference Pain Effect on Sleep: 2. Occasionally Pain Interference with Therapy Activities: 1. Rarely or not at all Pain Interference with Day-to-Day Activities: 1. Rarely or not at all Home Living/Prior [place]: Spouse/significant other Available Help at Discharge: Family;Available 24 hours/day Type of Home: House Home Access: Ramped entrance Home Layout: One level Bathroom Shower/Tub: Tub/shower unit (although has taken showers in MIL shower w/ seat.)  Lives With: Spouse;Other (Comment) (MIL who needs care) Prior Function Level of Independence: Independent with transfers;Requires assistive device for independence;Independent with gait  Able to Take Stairs?: No Vocation: On disability Vision/Perception     Cognition Overall Cognitive Status: Within Functional Limits for tasks assessed Orientation Level: Oriented X4 Safety/Judgment: Appears intact Comments: anxious about standing outside of Stedy Sensation Sensation Light Touch: Impaired Detail Peripheral sensation comments: absent anterior L thigh, inconsistent B feet. Light Touch Impaired Details: Impaired LLE Coordination Gross Motor Movements are Fluid and Coordinated: No Fine Motor Movements are Fluid and Coordinated:  No Heel Shin Test: able to perform R to L shin, unable Left. Motor  Motor Motor: Abnormal postural alignment and control;Other (comment) Motor - Skilled Clinical Observations: weakness  B LES   Trunk/Postural Assessment  Cervical Assessment Cervical Assessment: Exceptions to [facility] (forward head.) Thoracic Assessment Thoracic Assessment:  (rounded shoulders) Lumbar Assessment Lumbar Assessment:  (posterior pelvic tilt.)  Balance Balance Balance Assessed: Yes Static Sitting Balance Static Sitting - Balance Support: Feet supported Static Sitting - Level of Assistance: 5: Stand by assistance Static Standing Balance Static Standing - Balance Support: Bilateral upper extremity supported Static Standing - Level of Assistance: 3: Mod assist Static Standing - Comment/# of Minutes: 1-2 min Extremity Assessment      RLE Assessment RLE Assessment: Exceptions to [facility] General Strength Comments: knee flexion 3+/5, hip grossly 2-/5 LLE Assessment LLE Assessment: Exceptions to [facility] General Strength Comments: knee flexion 3+/5, hip grossly 2-/5  Care Tool Care Tool Bed Mobility Roll left and right activity   Roll left and right assist level: Minimal Assistance - Patient > 75%    Sit to lying activity   Sit to lying assist level: Moderate Assistance - Patient 50 - 74%    Lying to sitting on side of bed activity   Lying to sitting on side of bed assist level: the ability to move from lying on the back to sitting on the side of the bed with no back support.: Moderate Assistance - Patient 50 - 74%     Care Tool Transfers Sit to stand transfer   Sit to stand assist level: Maximal Assistance - Patient 25 - 49%    Chair/bed transfer   Chair/bed transfer assist level: Dependent - Librarian, academic transfer activity did not occur: Safety/medical concerns        Care Tool Locomotion Ambulation Ambulation activity did not occur: Safety/medical  concerns        Walk 10 feet activity Walk 10 feet activity did not occur: Safety/medical concerns       Walk 50 feet with 2 turns activity Walk 50 feet with 2 turns activity did not occur: Safety/medical concerns      Walk 150 feet activity Walk 150 feet activity did not occur: Safety/medical concerns      Walk 10 feet on uneven surfaces activity Walk 10 feet on uneven surfaces activity did not occur: Safety/medical concerns      Stairs Stair activity did not occur: Safety/medical concerns        Walk up/down 1 step activity Walk up/down 1 step or curb (drop down) activity did not occur: Safety/medical concerns      Walk up/down 4 steps activity Walk up/down 4 steps activity did not occur: Safety/medical concerns      Walk up/down 12 steps activity Walk up/down 12 steps activity did not occur: Safety/medical concerns      Pick up small objects from floor Pick up small object from the floor (from standing position) activity did not occur: Safety/medical concerns      Wheelchair Is the patient using a wheelchair?: Yes Type of Wheelchair: Manual   Wheelchair assist level: Minimal Assistance - Patient > 75% Max wheelchair distance: 50  Wheel 50 feet with 2 turns activity   Assist Level: Minimal Assistance - Patient > 75%  Wheel 150 feet activity   Assist Level: Maximal Assistance - Patient 25 - 49%    Refer to Care Plan for Long Term Goals  SHORT TERM GOAL WEEK 1 PT Short Term Goal 1 (Week 1): Pt will roll side to side w/ CGA. PT Short Term Goal 2 (  Week 1): Pt will transfer sup <> sit w/ min A logroll technique. PT Short Term Goal 3 (Week 1): Pt will transfer sit to stand w/ min A PT Short Term Goal 4 (Week 1): Pt will stand x 5' w/ LRAD (Stedy vs RW). PT Short Term Goal 5 (Week 1): PT to assess gait.  Recommendations for other services: Neuropsych  Skilled Therapeutic Intervention Evaluation completed (see details above and below) with education on PT POC and  goals and individual treatment initiated with focus on  strengthening, endurance, NMR, transfers, gait.  Pt presents sitting in w/c handed off from OT.  Pt somewhat teary discussing dx and deficits.  Pt w/ increased edema to B LES, especially R foot.  Pt exhibits absent sensation to light touch anterior L thigh, although inconsistent to B feet.  Pt wheeled ~ 29' w/ BUES w/ min A from PT to maintain straight line, occasional slipping of hands off wheels.  Pt wheeled through all therapy areas to familiarize w/ possible points of care.  Discussed D/C mode of transportation of SUV height but unsafe to perform.  Pt required max A at // bars to transfer sit to stand.  Pt leaning forward requiring PT to maintain upright when pt states she felt as if she was falling backward.  Pt returned to room and transferred sit to stand / Stedy and mod A.  Pt able to stand 1-2' before transferred to bed.  Pt does lean heavily on knee pad as well as upper support bar.  Pt requires mod to max A for sit<> supine using log roll technique.  Pt returned to w/c w/ Stedy and remained sitting w/ chair alarm on and all needs in reach.  Pt able to transfer sit to stand w/ mod to min A from elevated Stedy perch.    Mobility Bed Mobility Bed Mobility: Rolling Right;Rolling Left;Sit to Sidelying Right;Right Sidelying to Sit Rolling Right: Minimal Assistance - Patient > 75% Rolling Left: Minimal Assistance - Patient > 75% Right Sidelying to Sit: Moderate Assistance - Patient 50-74% Sit to Sidelying Right: Moderate Assistance - Patient 50-74% Transfers Transfers: Sit to Stand;Stand to Sit;Transfer via Geophysicist/field seismologist [employer]) Sit to Stand: Moderate Assistance - Patient 50-74% (from [employer], mod to min A) Stand to Sit: Moderate Assistance - Patient 50-74% (cues for slow controlled descent.) Transfer (Assistive device): Other (Comment) Transfer via Lift Equipment: Animal nutritionist: No Stairs / Advertising account executive: No Architect: Yes Wheelchair Assistance: Minimal assistance - Patient >75% Environmental health practitioner: Both upper extremities Wheelchair Parts Management: Needs assistance Distance: 50   Discharge Criteria: Patient will be discharged from PT if patient refuses treatment 3 consecutive times without medical reason, if treatment goals not met, if there is a change in medical status, if patient makes no progress towards goals or if patient is discharged from hospital.  The above assessment, treatment plan, treatment alternatives and goals were discussed and mutually agreed upon: by patient  [name] 10/03/2022, 12:31 PM

## 2022-10-03 NOTE — Progress Notes (Signed)
Pt performed NIF/VC per MD order with great effort.  NIF (-40) VC (2.0L)

## 2022-10-03 NOTE — Progress Notes (Signed)
Inpatient Rehabilitation Center Individual Statement of Services  Patient Name:  ROBERTA ANGELL  Date:  10/03/2022  Welcome to the [facility].  Our goal is to provide you with an individualized program based on your diagnosis and situation, designed to meet your specific needs.  With this comprehensive rehabilitation program, you will be expected to participate in at least 3 hours of rehabilitation therapies Monday-Friday, with modified therapy programming on the weekends.  Your rehabilitation program will include the following services:  Physical Therapy (PT), Occupational Therapy (OT), 24 hour per day rehabilitation nursing, Therapeutic Recreaction (TR), Neuropsychology, Care Coordinator, Rehabilitation Medicine, Nutrition Services, and Pharmacy Services  Weekly team conferences will be held on Tuesday to discuss your progress.  Your Inpatient Rehabilitation Care Coordinator will talk with you frequently to get your input and to update you on team discussions.  Team conferences with you and your family in attendance may also be held.  Expected length of stay: 3 weeks  Overall anticipated outcome: min assist level  Depending on your progress and recovery, your program may change. Your Inpatient Rehabilitation Care Coordinator will coordinate services and will keep you informed of any changes. Your Inpatient Rehabilitation Care Coordinator's name and contact numbers are listed  below.  The following services may also be recommended but are not provided by the [facility]:   [place] will be made to provide these services after discharge if needed.  Arrangements include referral to agencies that provide these services.  Your insurance has been verified to be:  [facility] Your primary doctor is:  [name]  Pertinent information will be shared with your doctor and your insurance  company.  Inpatient Rehabilitation Care Coordinator:  Ovidio Kin, P[place] or Emilia Beck  Information discussed with and copy given to patient by: Elease Hashimoto, 10/03/2022, 11:48 AM

## 2022-10-03 NOTE — Plan of Care (Signed)
Problem: RH Balance Goal: LTG: Patient will maintain dynamic sitting balance (OT) Description: LTG:  Patient will maintain dynamic sitting balance with assistance during activities of daily living (OT) Flowsheets (Taken 10/03/2022 1252) LTG: Pt will maintain dynamic sitting balance during ADLs with: Independent with assistive device Goal: LTG Patient will maintain dynamic standing with ADLs (OT) Description: LTG:  Patient will maintain dynamic standing balance with assist during activities of daily living (OT)  Flowsheets (Taken 10/03/2022 1252) LTG: Pt will maintain dynamic standing balance during ADLs with: Minimal Assistance - Patient > 75%   Problem: Sit to Stand Goal: LTG:  Patient will perform sit to stand in prep for activites of daily living with assistance level (OT) Description: LTG:  Patient will perform sit to stand in prep for activites of daily living with assistance level (OT) Flowsheets (Taken 10/03/2022 1252) LTG: PT will perform sit to stand in prep for activites of daily living with assistance level: Minimal Assistance - Patient > 75%   Problem: RH Grooming Goal: LTG Patient will perform grooming w/assist,cues/equip (OT) Description: LTG: Patient will perform grooming with assist, with/without cues using equipment (OT) Flowsheets (Taken 10/03/2022 1252) LTG: Pt will perform grooming with assistance level of: Independent with assistive device    Problem: RH Bathing Goal: LTG Patient will bathe all body parts with assist levels (OT) Description: LTG: Patient will bathe all body parts with assist levels (OT) Flowsheets (Taken 10/03/2022 1252) LTG: Pt will perform bathing with assistance level/cueing: Minimal Assistance - Patient > 75%   Problem: RH Dressing Goal: LTG Patient will perform upper body dressing (OT) Description: LTG Patient will perform upper body dressing with assist, with/without cues (OT). Flowsheets (Taken 10/03/2022 1252) LTG: Pt will perform upper body  dressing with assistance level of: Independent with assistive device Goal: LTG Patient will perform lower body dressing w/assist (OT) Description: LTG: Patient will perform lower body dressing with assist, with/without cues in positioning using equipment (OT) Flowsheets (Taken 10/03/2022 1252) LTG: Pt will perform lower body dressing with assistance level of: Contact Guard/Touching assist   Problem: RH Toileting Goal: LTG Patient will perform toileting task (3/3 steps) with assistance level (OT) Description: LTG: Patient will perform toileting task (3/3 steps) with assistance level (OT)  Flowsheets (Taken 10/03/2022 1252) LTG: Pt will perform toileting task (3/3 steps) with assistance level: Minimal Assistance - Patient > 75%   Problem: RH Simple Meal Prep Goal: LTG Patient will perform simple meal prep w/assist (OT) Description: LTG: Patient will perform simple meal prep with assistance, with/without cues (OT). Flowsheets (Taken 10/03/2022 1252) LTG: Pt will perform simple meal prep with assistance level of: Minimal Assistance - Patient > 75% LTG: Pt will perform simple meal prep w/level of: Wheelchair level   Problem: RH Laundry Goal: LTG Patient will perform laundry w/assist, cues (OT) Description: LTG: Patient will perform laundry with assistance, with/without cues (OT). Flowsheets (Taken 10/03/2022 1252) LTG: Pt will perform laundry with assistance level of: Minimal Assistance - Patient > 75% LTG: Pt will perform laundry with level of: Wheelchair level   Problem: RH Toilet Transfers Goal: LTG Patient will perform toilet transfers w/assist (OT) Description: LTG: Patient will perform toilet transfers with assist, with/without cues using equipment (OT) Flowsheets (Taken 10/03/2022 1257) LTG: Pt will perform toilet transfers with assistance level of: Minimal Assistance - Patient > 75%   Problem: RH Tub/Shower Transfers Goal: LTG Patient will perform tub/shower transfers w/assist  (OT) Description: LTG: Patient will perform tub/shower transfers with assist, with/without cues using equipment (OT) Flowsheets (  Taken 10/03/2022 1257) LTG: Pt will perform tub/shower stall transfers with assistance level of: Minimal Assistance - Patient > 75%

## 2022-10-04 DIAGNOSIS — G610 Guillain-Barre syndrome: Secondary | ICD-10-CM | POA: Diagnosis not present

## 2022-10-04 MED ORDER — GABAPENTIN 300 MG PO CAPS
900.0000 mg | ORAL_CAPSULE | Freq: Three times a day (TID) | ORAL | Status: DC
  Administered 2022-10-04 – 2022-11-06 (×99): 900 mg via ORAL
  Filled 2022-10-04 (×100): qty 3

## 2022-10-04 NOTE — Progress Notes (Signed)
PROGRESS NOTE   Subjective/Complaints:  Pt reports hands /fingers and feet feel like cement/hard feeling- not to touch as well as burning/tingling. Painful.  LBM yesterday.  B12 shot makes her itch, but takes benadryl.  Got foley out at 6am Has voided some and also needed 1 cath today.    ROS:  Pt denies SOB, abd pain, CP, N/V/C/D, and vision changes  Except for HPI  Objective:   No results found. Recent Labs    10/03/22 0633  WBC 4.9  HGB 12.1  HCT 37.8  PLT PLATELET CLUMPS NOTED ON SMEAR, UNABLE TO ESTIMATE   Recent Labs    10/02/22 1101 10/03/22 0633  NA 133* 133*  K 4.3 4.8  CL 98 97*  CO2 27 24  GLUCOSE 110* 104*  BUN 16 18  CREATININE 1.06* 1.02*  CALCIUM 8.5* 8.9    Intake/Output Summary (Last 24 hours) at 10/04/2022 0927 Last data filed at 10/04/2022 0746 Gross per 24 hour  Intake 471 ml  Output 1400 ml  Net -929 ml        Physical Exam: Vital Signs Blood pressure (!) 112/59, pulse 87, temperature 98.3 F (36.8 C), resp. rate 16, height '5\' 4"'$  (1.626 m), weight 123 kg, last menstrual period 08/17/2012, SpO2 96 %.     General: awake, alert, appropriate, sitting up in bed; nurse in room; NAD HENT: conjugate gaze; oropharynx moist CV: regular rate; no JVD Pulmonary: CTA B/L; no W/R/R- good air movement GI: soft, NT, ND, (+)BS- more normoactive Psychiatric: appropriate Neurological: Ox3 MS: UE strength 4/5 except distal is 4-/5 LE's- HF 2/5; KE 3+/5; DF 3+/5 and PF 4-/5 B/L  Skin: multiple IV's and blood draw tape areas- Assessment/Plan: 1. Functional deficits which require 3+ hours per day of interdisciplinary therapy in a comprehensive inpatient rehab setting. Physiatrist is providing close team supervision and 24 hour management of active medical problems listed below. Physiatrist and rehab team continue to assess barriers to discharge/monitor patient progress toward functional and  medical goals  Care Tool:  Bathing    Body parts bathed by patient: Right arm, Left arm, Chest, Abdomen, Front perineal area, Right upper leg, Left upper leg, Face   Body parts bathed by helper: Buttocks, Right lower leg, Left lower leg     Bathing assist Assist Level: Moderate Assistance - Patient 50 - 74%     Upper Body Dressing/Undressing Upper body dressing   What is the patient wearing?: Pull over shirt    Upper body assist Assist Level: Moderate Assistance - Patient 50 - 74%    Lower Body Dressing/Undressing Lower body dressing      What is the patient wearing?: Pants, Underwear/pull up     Lower body assist Assist for lower body dressing: Dependent - Patient 0%     Toileting Toileting    Toileting assist Assist for toileting: Total Assistance - Patient < 25%     Transfers Chair/bed transfer  Transfers assist     Chair/bed transfer assist level: Dependent - mechanical lift     Locomotion Ambulation   Ambulation assist   Ambulation activity did not occur: Safety/medical concerns  Walk 10 feet activity   Assist  Walk 10 feet activity did not occur: Safety/medical concerns        Walk 50 feet activity   Assist Walk 50 feet with 2 turns activity did not occur: Safety/medical concerns         Walk 150 feet activity   Assist Walk 150 feet activity did not occur: Safety/medical concerns         Walk 10 feet on uneven surface  activity   Assist Walk 10 feet on uneven surfaces activity did not occur: Safety/medical concerns         Wheelchair     Assist Is the patient using a wheelchair?: Yes Type of Wheelchair: Manual    Wheelchair assist level: Minimal Assistance - Patient > 75% Max wheelchair distance: 50    Wheelchair 50 feet with 2 turns activity    Assist        Assist Level: Minimal Assistance - Patient > 75%   Wheelchair 150 feet activity     Assist      Assist Level: Maximal  Assistance - Patient 25 - 49%   Blood pressure (!) 112/59, pulse 87, temperature 98.3 F (36.8 C), resp. rate 16, height '5\' 4"'$  (1.626 m), weight 123 kg, last menstrual period 08/17/2012, SpO2 96 %.  Medical Problem List and Plan: 1. 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  Antithrombotics: -DVT/anticoagulation:  Pharmaceutical: Lovenox start 1/17             -antiplatelet therapy: none   3. 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 Mood/Behavior/Sleep: LCSW to evaluate and provide emotional support             -antipsychotic agents: n/a   5. Neuropsych/cognition: This patient is capable of making decisions on her own behalf.   6. Skin/Wound Care: Routine skin care checks   7. Fluids/Electrolytes/Nutrition: Routine Is and Os and follow-up chemistries             -protein supplements for hypoalbuminemia             -continue folic acid, O27             -continue MVI   1/18- will check with pharmacy if needs B12 shots AND daily pills.  8: Chronic back pain/radiculopathy:  -continue Dilaudid 4 mg q 6 hours - is her home medicine (PTA per Dr. [name])             -continue gabapentin 600 mg TID             -continue Lidoderm patch daily             -continue Flexeril 10 mg TID prn   9: Urinary retention: Foley replaced on 1/15             -treated for UTI with Rocephin for  7 days   1/18- will start Flomax 0.4 mg qsupper and will try and remove Foley in AM- explained will need in/out caths likely before bladder muscle kicks  in and she can void.   1/19- foley out this Am at 6am- hasn't voided yet- added bladder scans q6 hours and then cath of volumes /350cc- pt made aware can take a few days to kick in/the flomax. Has voided some  and cathed x1-  10: Hypertension: monitor TID and prn             -continue Lasix 40 mg daily             -continue Lopressor 50 mg BID             -add magnesium gluconate '250mg'$  HS   1/19- BP controlled- con't regimen 11: GERD?/GI prophylaxis: continue Protonix   12: B12 deficiency: continue supplementation   1/19- says itches with B12 shots, but just takes benadryl 13: Class 3 obesity: BMI = 48; weight loss counseling   14: Hyponatremia, mild: follow-up BMP   15: Elevated serum creatinine; 0.75 1/16 to 1.06 on 1/17             -follow-up BMP   1/18- stable- con't to monitor weekly and prn 16: GBS: continue to monitor NIFs/VIC q 12 hours             -continue gabapentin   1/19- will increase gabapaentin to 900 mg TID 17: Glaucoma: continue latanoprost eye drops   18: Tobacco use: cessation counseling   19. Constipation  1/18- will con't Senna per pt request- to not increase- but if no BM by tomorrow, will try Sorbitol.   1/19- LBM yesterday   I spent a total of 37   minutes on total care today- >50% coordination of care- due to  D/w nursing about IV, neurogenic bladder and got foley out and cath schedule if cannot void.    LOS: 2 days A FACE TO FACE EVALUATION WAS PERFORMED  Dana [name] 10/04/2022, 9:27 AM

## 2022-10-04 NOTE — Progress Notes (Signed)
Patient bladder scan shows 548 mls. Patient states she feels she can void. Attempted female urinal but only emptied a small amount. In/Out cath completed, retrieved 500 mls of urine. Patient tolerated with minimal discomfort, she states she would like her foley placed back in. Explained why foley was removed and educated patient.

## 2022-10-04 NOTE — Progress Notes (Signed)
Physical Therapy Session Note  Patient Details  Name: Dana [name] MRN: [id number] Date of Birth: 10/09/1965  Today's Date: 10/04/2022 PT Individual Time: 4196-2229 PT Individual Time Calculation (min): 46 min   Short Term Goals: Week 1:  PT Short Term Goal 1 (Week 1): Pt will roll side to side w/ CGA. PT Short Term Goal 2 (Week 1): Pt will transfer sup <> sit w/ min A logroll technique. PT Short Term Goal 3 (Week 1): Pt will transfer sit to stand w/ min A PT Short Term Goal 4 (Week 1): Pt will stand x 5' w/ LRAD (Stedy vs RW). PT Short Term Goal 5 (Week 1): PT to assess gait.   Skilled Therapeutic Interventions/Progress Updates:  Patient seated upright in w/c on entrance to room. Patient alert and agreeable to PT session.   Patient with no pain complaint at start of session while seated but relates that attempts to stand in previous day's session was painful and difficult. Very fatigued. Relates that attempting stand pivot will be nearly impossible.   Therapeutic Activity: Attempted to don shoes while seated in w/c. Shoes are extremely tight and pt is now very upset as she believes that swelling is result of no longer having catheter and ability to get rid of excess fluid in body. Pt prefers catheter d/t having some level of saddle paresthesia. RN notified and relates that pt is now on diuretic to increase need to urinate to assist in voluntary voiding trial. However, pt relates that she knows when she needs to void but is unable to voluntarily void. Scheduled in/ out cath at noon. Pt prefers to wait as she does not feel urinary urge.    Bari socks donned MaxA.  Neuromuscular Re-ed: Pt taken to day room and guided in use of Kinetron while seated in w/c with setup at 50 then increased to 70. Attempts to maintain fast pace with reciprocation of BLE but is unable to sustain for more than 30 sec at a time.  During rest breaks, she is guided in BUE hold to 3# dumbell with L hand over R  for in/ out push from chest x10. Then hand positioning switched for x10.   Pt guided in slower use of Kinetron with increased control in reciprocation and focus in use of extensor push with each LE. Performed 3 x10.   NMR performed for improvements in motor control and coordination, balance, sequencing, judgement, and self confidence/ efficacy in performing all aspects of mobility at highest level of independence.   Transfers: Pt performed sit<>stand using STEDY and is able to rise to stand in STEDY with MinA. VC for controlled descent to bed with CGA.   Patient supine in bed at end of session with brakes locked, bed alarm set, and all needs within reach. RN notified as to pt's disposition and ready for bladder scan.    Therapy Documentation Precautions:  Precautions Precautions: Fall Precaution Comments: weakness, numbness L anterior thigh. Restrictions Weight Bearing Restrictions: No General:   Vital Signs:  Pain: Pain Assessment Pain Scale: 0-10 Pain Score: 0-No pain  Therapy/Group: Individual Therapy  Alger Simons PT, DPT, CSRS 10/04/2022, 12:25 PM

## 2022-10-04 NOTE — Progress Notes (Signed)
With excellent technique and effort: NIF = > -40, VC = 2.10 lpm

## 2022-10-04 NOTE — Progress Notes (Signed)
NIF: > -40 VC: 1.5 L  Great effort x 3

## 2022-10-04 NOTE — Progress Notes (Signed)
Occupational Therapy Session Note  Patient Details  Name: Dana [name] MRN: [id number] Date of Birth: 05-Dec-1965  Today's Date: 10/04/2022 OT Individual Time: 3149-7026 OT Individual Time Calculation (min): 73 min    Short Term Goals: Week 1:  OT Short Term Goal 1 (Week 1): Pt will power up to stand at grab bar or RW with mod A during LB dressing OT Short Term Goal 2 (Week 1): Pt will complete grooming and UB self care routine seated sink side with set up OT Short Term Goal 3 (Week 1): Pt will don LB garments over feet with AE with min a  Skilled Therapeutic Interventions/Progress Updates: Patient received toileting with use of the STEDY. Patient agreeable to OT treatment and motivated to get in the shower. Patient stood to [facility] with assist of one to get up from the toilet. Total assist for peri care. Used STEDY to move to shower bench. Assist of two from stand to sit for patient comfort. Once seated patient needed assist to [facility] hospital shirt. Able to bathe with assist. Had difficulty maintaining grip on hand held shower and manipulating it to point in the intended direction. Also dropped the washcloth several times. STEDY with assist of one to transfer from shower to w/c.  Patient did not feel safe reaching to feet to don underwear or pants-Total assist. Required some assist managing shirt over her head and pulling down in the back. Grooming performed from w/c at the sink. Patient reported feeling some what discouraged at her sudden lack of independence. Motivated to work hard to regain function. Continue with skilled OT POC.     Therapy Documentation Precautions:  Precautions Precautions: Fall Precaution Comments: weakness, numbness L anterior thigh. Restrictions Weight Bearing Restrictions: No General:   Vital Signs: Therapy Vitals Temp: 98.3 F (36.8 C) Pulse Rate: 87 Resp: 16 BP: (!) 112/59 Patient Position (if appropriate): Lying Oxygen Therapy SpO2: 96 % O2 Device:  Room Air Pain:   ADL: ADL Equipment Provided: Long-handled sponge Eating: Set up Where Assessed-Eating: Wheelchair Grooming: Minimal assistance Where Assessed-Grooming: Wheelchair, Sitting at sink Upper Body Bathing: Moderate assistance Where Assessed-Upper Body Bathing: Electronics engineer, Shower Lower Body Bathing: Dependent Where Assessed-Shower bench, Shower Upper Body Dressing: Minimal assistance Where Assessed-Upper Body Dressing:  Wheelchair Lower Body Dressing: Total Assist Where Assessed-Lower Body Dressing: Wheelchair Toileting: Dependent Where Assessed-Toileting: Toilet,  Toilet Transfer: Dependent Armed forces technical officer Method: Other (comment) (STEDY lift x 2) Tub/Shower Transfer: STEDY to Data processing manager Transfer: Dependent Social research officer, government Method: Other (comment) (Stedy lift x 2)    Therapy/Group: Individual Therapy  Hermina Barters 10/04/2022, 12:27 PM

## 2022-10-04 NOTE — Progress Notes (Signed)
Occupational Therapy Session Note  Patient Details  Name: Dana [name] MRN: [id number] Date of Birth: Jan 18, 1966  Today's Date: 10/04/2022 OT Individual Time:  - 1300-1415 75 min       Short Term Goals: Week 1:  OT Short Term Goal 1 (Week 1): Pt will power up to stand at grab bar or RW with mod A during LB dressing OT Short Term Goal 2 (Week 1): Pt will complete grooming and UB self care routine seated sink side with set up OT Short Term Goal 3 (Week 1): Pt will don LB garments over feet with AE with min a  Skilled Therapeutic Interventions/Progress Updates:   P seen for skilled OT session. Pt asleep in bed resting upon OT arrival. Reports working hard this am for shower and ith PT and feeling tired. Pt also reports not having her pain meds given. OT educated pt on need to request pain as OT checked flowsheets and all accounts report pt reporting "no pain". Pt did not realize she was to request pain meds when in pain. Reported 6/10 in lower back. Ice also applied for 10 min with + relief. Pt agreeable to bed level session. OT focussed on B UE HEP progression and breathing integration as pt does report some fear of not being able to progress and her future. OT demonstrated all therex including scap retraction, sh flexion and elbow ext with yellow (light) tband 2 sets of 10 reps. Adapted ends with knots as B  hands lost grip at times. OT issued 10 beads for pt's theraputty and pt demonstrated grasping, rolling, pinching and bead retrieval x 2 sets Bly with light (yellow) tputty. Pt required breif rest when nursing administered pain meds. Able to complete therex with 1 lb weight bar in supine for chest press, sh circles forward/back, horizontal abd/ add, and sh flexion 10 reps x 2 sets. OT educated on implications of GBS and need for rests between all ex and activity with pt understanding to direct her care as such to avoid overuse of mm and exhaustion especially initially. Pain reported at 1/10 at  end of session. OT left pt in care of NT for vitals with bed alarm set, needs and nurse call button in reach.   Therapy Documentation Precautions:  Precautions Precautions: Fall Precaution Comments: weakness, numbness L anterior thigh. Restrictions Weight Bearing Restrictions: No   Therapy/Group: Individual Therapy  Barnabas Lister 10/04/2022, 7:45 AM

## 2022-10-04 NOTE — Progress Notes (Signed)
Patient has not had a void at this time. Bladder scan done x3 showed 0 mls, last attempt 21 mls. 14 fr intermittent cath due to no void in 6 hours. Yellow, clear urine retrieved, 350 mls in amount. 0 ml post residual. Patient tolerated well with minimal discomfort.

## 2022-10-05 DIAGNOSIS — G610 Guillain-Barre syndrome: Secondary | ICD-10-CM | POA: Diagnosis not present

## 2022-10-05 MED ORDER — MELATONIN 5 MG PO TABS: 5.0000 mg | ORAL_TABLET | Freq: Every evening | ORAL | Status: DC | PRN

## 2022-10-05 NOTE — Progress Notes (Signed)
PROGRESS NOTE   Subjective/Complaints:  Pt reports hands /fingers and feet feel like Pt reports no caths since x1 yesterday in AM.   Slept poorly- for first time- up since 3;30am.   Tingling about the same.  Bowels much better- 2 Bms in last 24 hours.    ROS:  Pt denies SOB, abd pain, CP, N/V/C/D, and vision changes  Except for HPI  Objective:   No results found. Recent Labs    10/03/22 0633  WBC 4.9  HGB 12.1  HCT 37.8  PLT PLATELET CLUMPS NOTED ON SMEAR, UNABLE TO ESTIMATE   Recent Labs    10/03/22 0633  NA 133*  K 4.8  CL 97*  CO2 24  GLUCOSE 104*  BUN 18  CREATININE 1.02*  CALCIUM 8.9    Intake/Output Summary (Last 24 hours) at 10/05/2022 1346 Last data filed at 10/05/2022 1338 Gross per 24 hour  Intake 716 ml  Output 500 ml  Net 216 ml        Physical Exam: Vital Signs Blood pressure 136/67, pulse 85, temperature 98.2 F (36.8 C), resp. rate 18, height '5\' 4"'$  (1.626 m), weight 124 kg, last menstrual period 08/17/2012, SpO2 99 %.     General: awake, alert, appropriate, sitting up in bed; BMI down to 46.92; NAD HENT: conjugate gaze; oropharynx moist CV: regular rate; no JVD Pulmonary: CTA B/L; no W/R/R- good air movement GI: soft, NT, ND, (+)BS- normoactive Psychiatric: appropriate Neurological: Ox3  MS: UE strength 4/5 except distal is 4-/5 LE's- HF 2/5; KE 3+/5; DF 3+/5 and PF 4-/5 B/L  Skin: multiple IV's and blood draw tape areas- Assessment/Plan: 1. Functional deficits which require 3+ hours per day of interdisciplinary therapy in a comprehensive inpatient rehab setting. Physiatrist is providing close team supervision and 24 hour management of active medical problems listed below. Physiatrist and rehab team continue to assess barriers to discharge/monitor patient progress toward functional and medical goals  Care Tool:  Bathing    Body parts bathed by patient: Right arm, Left  arm, Chest, Abdomen, Front perineal area, Right upper leg, Left upper leg, Face   Body parts bathed by helper: Buttocks, Right lower leg, Left lower leg     Bathing assist Assist Level: Moderate Assistance - Patient 50 - 74%     Upper Body Dressing/Undressing Upper body dressing   What is the patient wearing?: Pull over shirt    Upper body assist Assist Level: Moderate Assistance - Patient 50 - 74%    Lower Body Dressing/Undressing Lower body dressing      What is the patient wearing?: Pants, Underwear/pull up     Lower body assist Assist for lower body dressing: Dependent - Patient 0%     Toileting Toileting    Toileting assist Assist for toileting: Total Assistance - Patient < 25%     Transfers Chair/bed transfer  Transfers assist     Chair/bed transfer assist level: Dependent - mechanical lift     Locomotion Ambulation   Ambulation assist   Ambulation activity did not occur: Safety/medical concerns          Walk 10 feet activity   Assist  Walk 10 feet  activity did not occur: Safety/medical concerns        Walk 50 feet activity   Assist Walk 50 feet with 2 turns activity did not occur: Safety/medical concerns         Walk 150 feet activity   Assist Walk 150 feet activity did not occur: Safety/medical concerns         Walk 10 feet on uneven surface  activity   Assist Walk 10 feet on uneven surfaces activity did not occur: Safety/medical concerns         Wheelchair     Assist Is the patient using a wheelchair?: Yes Type of Wheelchair: Manual    Wheelchair assist level: Minimal Assistance - Patient > 75% Max wheelchair distance: 50    Wheelchair 50 feet with 2 turns activity    Assist        Assist Level: Minimal Assistance - Patient > 75%   Wheelchair 150 feet activity     Assist      Assist Level: Maximal Assistance - Patient 25 - 49%   Blood pressure 136/67, pulse 85, temperature 98.2 F (36.8  C), resp. rate 18, height '5\' 4"'$  (1.626 m), weight 124 kg, last menstrual period 08/17/2012, SpO2 99 %.  Medical Problem List and Plan: 1. 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  Antithrombotics: -DVT/anticoagulation:  Pharmaceutical: Lovenox start 1/17             -antiplatelet therapy: none   3. 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 Mood/Behavior/Sleep: LCSW to evaluate and provide emotional support             -antipsychotic agents: n/a   5. Neuropsych/cognition: This patient is capable of making decisions on her own behalf.   6. Skin/Wound Care: Routine skin care checks   7. Fluids/Electrolytes/Nutrition: Routine Is and Os and follow-up chemistries             -protein supplements for hypoalbuminemia             -continue folic acid, D40             -continue MVI   1/18- will check with pharmacy if needs B12 shots AND daily pills.  8: Chronic back pain/radiculopathy:  -continue Dilaudid 4 mg q 6 hours - is her home medicine (PTA per Dr. [name])             -continue gabapentin 600 mg TID             -continue Lidoderm patch daily             -continue Flexeril 10 mg TID prn   9: Urinary retention: Foley replaced on 1/15             -treated for UTI with Rocephin for  7 days   1/18- will start Flomax 0.4 mg qsupper and will try and remove Foley in AM- explained will need in/out caths likely before bladder muscle kicks in and she can void.  1/19- foley out this Am at 6am- hasn't voided yet- added bladder scans q6 hours and then cath of volumes /350cc- pt made aware can take a few days to kick in/the flomax. Has voided some and cathed x1-   1/20- no more caths require-d voiding on her own-  con't flomax 10: Hypertension: monitor TID and prn             -continue Lasix 40 mg daily             -continue Lopressor 50 mg BID             -add magnesium gluconate '250mg'$  HS   1/19- BP controlled- con't regimen 11: GERD?/GI prophylaxis: continue Protonix   12: B12 deficiency: continue supplementation   1/19- says itches with B12 shots, but just takes benadryl 13: Class 3 obesity: BMI = 48; weight loss counseling   14: Hyponatremia, mild: follow-up BMP   15: Elevated serum creatinine; 0.75 1/16 to 1.06 on 1/17             -follow-up BMP   1/18- stable- con't to monitor weekly and prn 16: GBS: continue to monitor NIFs/VIC q 12 hours             -continue gabapentin   1/19- will increase gabapaentin to 900 mg TID  1/20- hasn't kicked in yet- but usually takes a few days 17: Glaucoma: continue latanoprost eye drops   18: Tobacco use: cessation counseling   19. Constipation  1/18- will con't Senna per pt request- to not increase- but if no BM by tomorrow, will try Sorbitol.   1/19- LBM yesterday  1/20- LBM x2 in last 24 hours 20.. insomnia  1/20- will start Melatonin 5 mg QHS prn- don't want trazodone since can cause urinary retention   I spent a total of 36   minutes on total care today- >50% coordination of care- due to  D/w nursing about sleep and bowels and voiding- and IPOC.    LOS: 3 days A FACE TO FACE EVALUATION WAS PERFORMED  Kenzo Ozment 10/05/2022, 1:46 PM

## 2022-10-05 NOTE — Progress Notes (Signed)
NIF > -40 VC 2.0 L  Great effort

## 2022-10-05 NOTE — IPOC Note (Signed)
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[facility])  Hospital Problems: Principal Problem:   Guillain Barr syndrome [facility])     Functional Problem List: Nursing Bladder, Bowel, Endurance, Pain, Motor, Safety, Skin Integrity  PT Balance, Safety, Edema, Sensory, Endurance, Motor  OT Balance, Endurance, Behavior, Motor, Sensory, Safety, Other (Comment), Pain (word finding deficits)  SLP    TR         Basic ADL's: OT Grooming, Bathing, Dressing, Toileting     Advanced  ADL's: OT Simple Meal Preparation, Full Meal Preparation     Transfers: PT Bed Mobility, Bed to Chair, Car, Manufacturing systems engineer, Metallurgist: PT Ambulation, Emergency planning/management officer     Additional Impairments: OT Fuctional Use of Upper Extremity  SLP        TR      Anticipated Outcomes Item Anticipated Outcome  Self Feeding indep  Swallowing      Basic self-care  mod I for w/c/seated level, min A standing  Toileting  min A   Bathroom Transfers min A  Bowel/Bladder  continent of bowel and bladder  Transfers  CGA  Locomotion  Min/CGA w/ LRAD  Communication     Cognition     Pain  pain level less than 4  Safety/Judgment  remain free from injury, prevent falls with mod I assist   Therapy Plan: PT Intensity: Minimum of 1-2 x/day ,45 to 90 minutes PT Frequency: 5 out of 7 days PT Duration Estimated Length of Stay: 3 weeks OT Intensity: Minimum of 1-2 x/day, 45 to 90 minutes OT Frequency: 5 out of 7 days OT Duration/Estimated Length of Stay: 3 weeks     Team Interventions: Nursing Interventions Patient/Family Education, Pain Management, Bladder Management, Medication Management, Bowel Management, Cognitive Remediation/Compensation, Skin Care/Wound Management, Psychosocial Support, Discharge Planning, Disease Management/Prevention  PT interventions Ambulation/gait training,  Community reintegration, Neuromuscular re-education, UE/LE Strength taining/ROM, Wheelchair propulsion/positioning, Discharge planning, Training and development officer, Therapeutic Activities, UE/LE Coordination activities, Functional mobility training, Patient/family education, Therapeutic Exercise  OT Interventions Training and development officer, Community reintegration, Discharge planning, Functional electrical stimulation, Functional mobility training, DME/adaptive equipment instruction, Neuromuscular re-education, Pain management, Patient/family education, Psychosocial support, Self Care/advanced ADL retraining, Skin care/wound managment, Therapeutic Activities, Splinting/orthotics, Therapeutic Exercise, UE/LE Strength taining/ROM, UE/LE Coordination activities, Wheelchair propulsion/positioning (expressive language with word finding deficits)  SLP Interventions    TR Interventions    SW/CM Interventions Discharge Planning, Psychosocial Support, Patient/Family Education   Barriers to Discharge MD  Medical stability, Home enviroment access/loayout, Neurogenic bowel and bladder, Weight, and Weight bearing restrictions  Nursing Decreased caregiver support, Incontinence, Neurogenic Bowel & Bladder, Weight home with spouse 1 level ramp entrance  PT Decreased caregiver support    OT Home environment access/layout, Neurogenic Bowel & Bladder bathroom accessibility for w/c and walker, new Foley due to UR  SLP      SW Insurance for SNF coverage     Team Discharge Planning: Destination: PT-Home ,OT- Home , SLP-  Projected Follow-up: PT-Home health PT, OT-  Home health OT, SLP-  Projected Equipment Needs: PT-To be determined, OT- 3 in 1 bedside comode, Rolling walker with 5" wheels, Tub/shower bench, Wheelchair (measurements), Wheelchair cushion (measurements), SLP-  Equipment Details: PT-pt has rollator (not using), SPC, OT-w/c, cushion, RW, TTB, HHOT, 3 in 1 commode Patient/family involved in discharge  planning: PT- Patient,  OT-Patient, SLP-   MD ELOS: 3 weeks Medical Rehab Prognosis:  Good Assessment:  The patient has been admitted for CIR therapies with the diagnosis of GBS. The team will be addressing functional mobility, strength, stamina, balance, safety, adaptive techniques and equipment, self-care, bowel and bladder mgt, patient and caregiver education, . Goals have been set at [facility]- min A. Anticipated discharge destination is home.        See Team Conference Notes for weekly updates to the plan of care

## 2022-10-06 NOTE — Progress Notes (Signed)
Occupational Therapy Session Note  Patient Details  Name: Dana [name] MRN: [id number] Date of Birth: Feb 26, 1966  Today's Date: 10/06/2022 OT Individual Time: 1009-1105 OT Individual Time Calculation (min): 56 min   Today's Date: 10/06/2022 OT Individual Time: 1457-1534 OT Individual Time Calculation (min): 37 min   Short Term Goals: Week 1:  OT Short Term Goal 1 (Week 1): Pt will power up to stand at grab bar or RW with mod A during LB dressing OT Short Term Goal 2 (Week 1): Pt will complete grooming and UB self care routine seated sink side with set up OT Short Term Goal 3 (Week 1): Pt will don LB garments over feet with AE with min a  Skilled Therapeutic Interventions/Progress Updates:   Session 1: Pt received sitting in [facility] for skilled OT session with focus on functional transfers, standing/activity tolerance, functional reach, and bilateral hand coordination. Pt agreeable to interventions, demonstrating overall pleasant mood. Pt with intermediate pain in R-knee. OT offering intermediate rest breaks and positioning suggestions throughout session to address pain/fatigue and maximize participation/safety in session.   Pt dependent for WC transport from room<>therapy gym for time management. In therapy gym, pt uses stedy to participate in standing/functional reach activity with use of squigz/mirror. Pt requires Min A for initial transfer onto stedy, thereafter completing STS from stedy perch with CGA. Pt uses bilateral UE to reach and pull squigz off of mirror, squigz placed at varying heights for further challenge. Pt completes a total of 3 runs of activity with intermediate seated rests needed.   Pt then transitions to seated table-top activity with focus on functional grasp and bilateral hand coordination. Pt brings 1-inch blocks across midline, placing them in a row, then stacking/unstacking them, completing activity by placing block back into original container. Pt with noticeable  decreased strength/coordination in L-hand.   Pt issued weighted/insulated blue cup for decreased spillage when drinking.   Pt remained seated in [facility] with all immediate needs met at end of session. Pt continues to be appropriate for skilled OT intervention to promote further functional independence.   Session 2: Pt received sitting in recliner with visitors present for skilled OT session with focus on bilat hand strengthening. Pt agreeable to interventions, despite demonstrating overall tearful mood. Pt with no reports of pain stating "it's just that weird feeling" in reference to peripheral sensation. OT offering intermediate rest breaks and positioning suggestions throughout session to address potential pain/fatigue and maximize participation/safety in session.   Pt performs recliner<>WC transfer with use of stedy+ CGA. Pt dependent for WC transport from room<>day room for time management. In day room, pt participates in series of foam block exercises for general hand strengthening, including: -Gross grip -Key pinch -Three finger pinch -Pad to pad pinch Pt performs 1 set/10 reps of each exercise with pink foam block. HEP issued.   Pt remained seated in recliner with all immediate needs met at end of session. Pt continues to be appropriate for skilled OT intervention to promote further functional independence.   Therapy Documentation Precautions:  Precautions Precautions: Fall Precaution Comments: weakness, numbness L anterior thigh. Restrictions Weight Bearing Restrictions: No  Therapy/Group: Individual Therapy  Maudie Mercury, OTR/L, MSOT  10/06/2022, 6:15 AM

## 2022-10-06 NOTE — Progress Notes (Signed)
NIF -40 

## 2022-10-06 NOTE — Progress Notes (Signed)
Physical Therapy Session Note  Patient Details  Name: EBONEY CLAYBROOK MRN: [id number] Date of Birth: 12/15/1965  Today's Date: 10/06/2022 PT Individual Time: 2979-8921, 1400-1430  PT Individual Time Calculation (min): 70 min , 30 min   Short Term Goals: Week 1:  PT Short Term Goal 1 (Week 1): Pt will roll side to side w/ CGA. PT Short Term Goal 2 (Week 1): Pt will transfer sup <> sit w/ min A logroll technique. PT Short Term Goal 3 (Week 1): Pt will transfer sit to stand w/ min A PT Short Term Goal 4 (Week 1): Pt will stand x 5' w/ LRAD (Stedy vs RW). PT Short Term Goal 5 (Week 1): PT to assess gait.  Skilled Therapeutic Interventions/Progress Updates:      Therapy Documentation Precautions:  Precautions Precautions: Fall Precaution Comments: weakness, numbness L anterior thigh. Restrictions Weight Bearing Restrictions: No  Treatment Session 1:   Pt agreeable to PT session with emphasis on transfer training. Pt with unrated right LE pain, pre-medicated prior to session.   Pt with multiple questions regarding GBS diagnosis and recovery. PT utilized therapeutic use of self to provide pt education of disease process and provided encouragement.   Pt requires CGA with supine to sit EOB with use of bed features. Pt participated in blocked practice of sit to stand transfers 2 x 4 from bed level with mod A. PT educated pt on appropriate set-up, sequencing and use of momentum.   Transitioned to bed to chair transfer training and PT educated pt regarding head/hips relationship. Pt requires max A with squat pivot transfer to w/c and pt with increased fear and concern of fall. PT introduced slide board and pt attempted and unsuccessful.   Pt requests need to toilet and pt left seated edge of bed with nurse present with all needs in reach.   Treatment Session 2:    Pt agreeable to PT session with emphasis on transfer training and pain management. Pt with pain and tremors to bilateral  hands. Educated patient regarding pain gate theory and performed sensory desensitization with textured cloth and pt reports decrease in pain.   PT obtained RW and pt with unsuccessful sit to stand attempt with RW despite max A. Pt frequently verbalizes her fear of falling. PT provided encouragement and discussed the importance of facing challenges in order to increase independence with mobility. PT recommended neuropsych and pt agreeable, plan to notify SW.   Pt left seated in recliner at bedside with chair alarm on and all needs within reach.   Therapy/Group: Individual Therapy  Verl Dicker Verl Dicker PT, DPT  10/06/2022, 7:51 AM

## 2022-10-06 NOTE — Progress Notes (Signed)
PROGRESS NOTE   Subjective/Complaints: No c/o today , family visiting   ROS:  Pt denies SOB, abd pain, CP, N/V/C/D, and vision changes    Objective:   No results found. No results for input(s): "WBC", "HGB", "HCT", "PLT" in the last 72 hours.  No results for input(s): "NA", "K", "CL", "CO2", "GLUCOSE", "BUN", "CREATININE", "CALCIUM" in the last 72 hours.   Intake/Output Summary (Last 24 hours) at 10/06/2022 1453 Last data filed at 10/06/2022 1318 Gross per 24 hour  Intake 460 ml  Output 0 ml  Net 460 ml         Physical Exam: Vital Signs Blood pressure 105/71, pulse 78, temperature 97.9 F (36.6 C), resp. rate 18, height '5\' 4"'$  (1.626 m), weight 124 kg, last menstrual period 08/17/2012, SpO2 96 %.   General: No acute distress Mood and affect are appropriate Heart: Regular rate and rhythm no rubs murmurs or extra sounds Lungs: Clear to auscultation, breathing unlabored, no rales or wheezes Abdomen: Positive bowel sounds, soft nontender to palpation, nondistended    MS: UE strength 4/5 except distal is 4-/5 LE's- HF 2/5; KE 3+/5; DF 3+/5 and PF 4-/5 B/L  Skin: multiple IV's and blood draw tape areas- Assessment/Plan: 1. Functional deficits which require 3+ hours per day of interdisciplinary therapy in a comprehensive inpatient rehab setting. Physiatrist is providing close team supervision and 24 hour management of active medical problems listed below. Physiatrist and rehab team continue to assess barriers to discharge/monitor patient progress toward functional and medical goals  Care Tool:  Bathing    Body parts bathed by patient: Right arm, Left arm, Chest, Abdomen, Front perineal area, Right upper leg, Left upper leg, Face   Body parts bathed by helper: Buttocks, Right lower leg, Left lower leg     Bathing assist Assist Level: Moderate Assistance - Patient 50 - 74%     Upper Body  Dressing/Undressing Upper body dressing   What is the patient wearing?: Pull over shirt    Upper body assist Assist Level: Moderate Assistance - Patient 50 - 74%    Lower Body Dressing/Undressing Lower body dressing      What is the patient wearing?: Pants, Underwear/pull up     Lower body assist Assist for lower body dressing: Dependent - Patient 0%     Toileting Toileting    Toileting assist Assist for toileting: Total Assistance - Patient < 25%     Transfers Chair/bed transfer  Transfers assist     Chair/bed transfer assist level: Dependent - mechanical lift     Locomotion Ambulation   Ambulation assist   Ambulation activity did not occur: Safety/medical concerns          Walk 10 feet activity   Assist  Walk 10 feet activity did not occur: Safety/medical concerns        Walk 50 feet activity   Assist Walk 50 feet with 2 turns activity did not occur: Safety/medical concerns         Walk 150 feet activity   Assist Walk 150 feet activity did not occur: Safety/medical concerns         Walk 10 feet on uneven surface  activity   Assist Walk 10 feet on uneven surfaces activity did not occur: Safety/medical concerns         Wheelchair     Assist Is the patient using a wheelchair?: Yes Type of Wheelchair: Manual    Wheelchair assist level: Minimal Assistance - Patient > 75% Max wheelchair distance: 50    Wheelchair 50 feet with 2 turns activity    Assist        Assist Level: Minimal Assistance - Patient > 75%   Wheelchair 150 feet activity     Assist      Assist Level: Maximal Assistance - Patient 25 - 49%   Blood pressure 105/71, pulse 78, temperature 97.9 F (36.6 C), resp. rate 18, height '5\' 4"'$  (1.626 m), weight 124 kg, last menstrual period 08/17/2012, SpO2 96 %.  Medical Problem List and Plan: 1. 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  Antithrombotics: -DVT/anticoagulation:  Pharmaceutical: Lovenox start 1/17             -antiplatelet therapy: none   3. 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 Mood/Behavior/Sleep: LCSW to evaluate and provide emotional support             -antipsychotic agents: n/a   5. Neuropsych/cognition: This patient is capable of making decisions on her own behalf.   6. Skin/Wound Care: Routine skin care checks   7. Fluids/Electrolytes/Nutrition: Routine Is and Os and follow-up chemistries             -protein supplements for hypoalbuminemia             -continue folic acid, K16             -continue MVI   1/18- will check with pharmacy if needs B12 shots AND daily pills.  8: Chronic back pain/radiculopathy:  -continue Dilaudid 4 mg q 6 hours - is her home medicine (PTA per Dr. [name])             -continue gabapentin 600 mg TID             -continue Lidoderm patch daily             -continue Flexeril 10 mg TID prn   9: Urinary retention: Foley replaced on 1/15             -treated for UTI with Rocephin for  7 days   1/18- will start Flomax 0.4 mg qsupper and will try and remove Foley in AM- explained will need in/out caths likely before bladder muscle kicks in and she can void.   1/19- foley out this Am at 6am- hasn't voided yet- added bladder scans q6 hours and then cath of volumes /350cc- pt made aware can take a few days to kick in/the flomax. Has voided some and cathed x1-   1/20- no more caths require-d voiding on her own- con't flomax 1/21 voiding well  10: Hypertension: monitor TID and prn             -continue Lasix 40 mg daily             -  continue Lopressor 50 mg BID             -add magnesium gluconate '250mg'$  HS   1/19- BP controlled- con't regimen 11: GERD?/GI prophylaxis: continue Protonix    12: B12 deficiency: continue supplementation   1/19- says itches with B12 shots, but just takes benadryl 13: Class 3 obesity: BMI = 48; weight loss counseling   14: Hyponatremia, mild: follow-up BMP   15: Elevated serum creatinine; 0.75 1/16 to 1.06 on 1/17             -follow-up BMP   1/18- stable- con't to monitor weekly and prn 16: GBS: continue to monitor NIFs/VIC q 12 hours             -continue gabapentin   1/19- will increase gabapaentin to 900 mg TID  1/20- hasn't kicked in yet- but usually takes a few days 17: Glaucoma: continue latanoprost eye drops   18: Tobacco use: cessation counseling   19. Constipation  1/18- will con't Senna per pt request- to not increase- but if no BM by tomorrow, will try Sorbitol.   1/19- LBM yesterday  1/20- LBM x2 in last 24 hours 20.. insomnia  1/20- will start Melatonin 5 mg QHS prn- don't want trazodone since can cause urinary retention      LOS: 4 days A FACE TO FACE EVALUATION WAS PERFORMED  Dana [name] 10/06/2022, 2:53 PM

## 2022-10-06 NOTE — Progress Notes (Signed)
Patient refusing scheduled VTE medication this evening of enoxaparin injection 60 mg. Patient explaining frustration with frequent injections and pain associated with injections. Requesting if another medication to replace this medication. Explained to patient I can pass this information along to the next shift as well as encouraged her to explain this to her Doctor. However, also explained the benefits of this medication for current treatment issues and the reason Provider has her on this medication. However, continued to refuse medication this evening.

## 2022-10-07 DIAGNOSIS — G610 Guillain-Barre syndrome: Secondary | ICD-10-CM | POA: Diagnosis not present

## 2022-10-07 LAB — BASIC METABOLIC PANEL
Anion gap: 11 (ref 5–15)
BUN: 21 mg/dL — ABNORMAL HIGH (ref 6–20)
CO2: 26 mmol/L (ref 22–32)
Calcium: 8.6 mg/dL — ABNORMAL LOW (ref 8.9–10.3)
Chloride: 97 mmol/L — ABNORMAL LOW (ref 98–111)
Creatinine, Ser: 1.09 mg/dL — ABNORMAL HIGH (ref 0.44–1.00)
GFR, Estimated: 59 mL/min — ABNORMAL LOW (ref >60)
Glucose, Bld: 131 mg/dL — ABNORMAL HIGH (ref 70–99)
Potassium: 4.1 mmol/L (ref 3.5–5.1)
Sodium: 134 mmol/L — ABNORMAL LOW (ref 135–145)

## 2022-10-07 LAB — CBC
HCT: 33.7 % — ABNORMAL LOW (ref 36.0–46.0)
Hemoglobin: 11 g/dL — ABNORMAL LOW (ref 12.0–15.0)
MCH: 30.4 pg (ref 26.0–34.0)
MCHC: 32.6 g/dL (ref 30.0–36.0)
MCV: 93.1 fL (ref 80.0–100.0)
Platelets: 339 10*3/uL (ref 150–400)
RBC: 3.62 MIL/uL — ABNORMAL LOW (ref 3.87–5.11)
RDW: 15.6 % — ABNORMAL HIGH (ref 11.5–15.5)
WBC: 7.5 10*3/uL (ref 4.0–10.5)
nRBC: 0 % (ref 0.0–0.2)

## 2022-10-07 MED ORDER — METHYLPREDNISOLONE ACETATE 40 MG/ML IJ SUSP
80.0000 mg | Freq: Once | INTRAMUSCULAR | Status: DC
  Filled 2022-10-07: qty 2

## 2022-10-07 MED ORDER — BUPIVACAINE HCL (PF) 0.5 % IJ SOLN
10.0000 mL | Freq: Once | INTRAMUSCULAR | Status: DC
  Filled 2022-10-07: qty 10

## 2022-10-07 NOTE — Progress Notes (Signed)
Physical Therapy Session Note  Patient Details  Name: Dana [name] MRN: [id number] Date of Birth: 1965-10-16  Today's Date: 10/07/2022 PT Individual Time: 1330-1443 PT Individual Time Calculation (min): 73 min   Short Term Goals: Week 1:  PT Short Term Goal 1 (Week 1): Pt will roll side to side w/ CGA. PT Short Term Goal 2 (Week 1): Pt will transfer sup <> sit w/ min A logroll technique. PT Short Term Goal 3 (Week 1): Pt will transfer sit to stand w/ min A PT Short Term Goal 4 (Week 1): Pt will stand x 5' w/ LRAD (Stedy vs RW). PT Short Term Goal 5 (Week 1): PT to assess gait.  Skilled Therapeutic Interventions/Progress Updates:      Therapy Documentation Precautions:  Precautions Precautions: Fall Precaution Comments: weakness, numbness L anterior thigh. Restrictions Weight Bearing Restrictions: No  Pt agreeable to additional PT and received transferring to [facility] with nursing for toileting. PT present for hand off from nurse tech.   Pt CGA with Stedy transfer and min A for peri-care with toilet transfer. Pt requested to return to bed due to increase in right knee pain. Pt reports she declined knee injections from MD due to lack of numbing agent. PT provided pt with cryotherapy to right knee for pain relief.   Pt required mod A for LE management with sit to supine. Pt performed following exercises supine to address strength and proprioceptive deficits:   -gluteal sets 3 x 10   -ankle pumps x 30   -SLR's bilaterally 3 x 10   -Supine hip abduction bilaterally 3 x 10   Pt left semi-reclined in bed with alarm on and all needs within reach.   Therapy/Group: Individual Therapy  Verl Dicker Verl Dicker PT, DPT  10/07/2022, 2:33 PM

## 2022-10-07 NOTE — Progress Notes (Signed)
PROGRESS NOTE   Subjective/Complaints: Pt reports needs shots into R>>L knee- usually gets done by [employer] at [facility] [employer] [facility]- a PA- but sees Dr Marlou Sa as well. Has been >3 months since last done per pt.   Peeing well- no caths required.  LBM overnight- early this AM.   Sore all over from good intense therapy.    ROS:  Pt denies SOB, abd pain, CP, N/V/C/D, and vision changes   Objective:   No results found. Recent Labs    10/07/22 0621  WBC 7.5  HGB 11.0*  HCT 33.7*  PLT 339   Recent Labs    10/07/22 0621  NA 134*  K 4.1  CL 97*  CO2 26  GLUCOSE 131*  BUN 21*  CREATININE 1.09*  CALCIUM 8.6*    Intake/Output Summary (Last 24 hours) at 10/07/2022 1021 Last data filed at 10/07/2022 0834 Gross per 24 hour  Intake 600 ml  Output 0 ml  Net 600 ml        Physical Exam: Vital Signs Blood pressure 124/62, pulse (!) 56, temperature 98.2 F (36.8 C), temperature source Oral, resp. rate 16, height '5\' 4"'$  (1.626 m), weight 124 kg, last menstrual period 08/17/2012, SpO2 95 %.    General: awake, alert, appropriate, sitting up in bed; c/o R>>>L knee pain; BMI 46.92; NAD HENT: conjugate gaze; oropharynx moist CV: regular rhythm, bradycardic rate; no JVD Pulmonary: CTA B/L; no W/R/R- good air movement GI: soft, NT, ND, (+)BS- protuberant Psychiatric: appropriate- quiet, slightly flat, but stable Neurological: Ox3  MS: Has R>L knee effusion noted with palpable baker's cyst in back of R knee UE strength 4/5 except distal is 4-/5 LE's- HF 2/5; KE 3+/5; DF 3+/5 and PF 4-/5 B/L  Skin: multiple IV's and blood draw tape areas- Assessment/Plan: 1. Functional deficits which require 3+ hours per day of interdisciplinary therapy in a comprehensive inpatient rehab setting. Physiatrist is providing close team supervision and 24 hour management of active medical problems listed below. Physiatrist and rehab team continue  to assess barriers to discharge/monitor patient progress toward functional and medical goals  Care Tool:  Bathing    Body parts bathed by patient: Right arm, Left arm, Chest, Abdomen, Front perineal area, Right upper leg, Left upper leg, Face   Body parts bathed by helper: Buttocks, Right lower leg, Left lower leg     Bathing assist Assist Level: Moderate Assistance - Patient 50 - 74%     Upper Body Dressing/Undressing Upper body dressing   What is the patient wearing?: Pull over shirt    Upper body assist Assist Level: Moderate Assistance - Patient 50 - 74%    Lower Body Dressing/Undressing Lower body dressing      What is the patient wearing?: Pants, Underwear/pull up     Lower body assist Assist for lower body dressing: Total Assistance - Patient < 25%     Toileting Toileting    Toileting assist Assist for toileting: 2 Helpers     Transfers Chair/bed transfer  Transfers assist     Chair/bed transfer assist level: Dependent - mechanical lift     Locomotion Ambulation   Ambulation assist   Ambulation activity  did not occur: Safety/medical concerns          Walk 10 feet activity   Assist  Walk 10 feet activity did not occur: Safety/medical concerns        Walk 50 feet activity   Assist Walk 50 feet with 2 turns activity did not occur: Safety/medical concerns         Walk 150 feet activity   Assist Walk 150 feet activity did not occur: Safety/medical concerns         Walk 10 feet on uneven surface  activity   Assist Walk 10 feet on uneven surfaces activity did not occur: Safety/medical concerns         Wheelchair     Assist Is the patient using a wheelchair?: Yes Type of Wheelchair: Manual    Wheelchair assist level: Minimal Assistance - Patient > 75% Max wheelchair distance: 50    Wheelchair 50 feet with 2 turns activity    Assist        Assist Level: Minimal Assistance - Patient > 75%   Wheelchair  150 feet activity     Assist      Assist Level: Maximal Assistance - Patient 25 - 49%   Blood pressure 124/62, pulse (!) 56, temperature 98.2 F (36.8 C), temperature source Oral, resp. rate 16, height '5\' 4"'$  (1.626 m), weight 124 kg, last menstrual period 08/17/2012, SpO2 95 %.  Medical Problem List and Plan: 1. 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  Antithrombotics: -DVT/anticoagulation:  Pharmaceutical: Lovenox start 1/17             -antiplatelet therapy: none   3. 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 Mood/Behavior/Sleep: LCSW to evaluate and provide emotional support             -antipsychotic agents: n/a   5. Neuropsych/cognition: This patient is capable of making decisions on her own behalf.   6. Skin/Wound Care: Routine skin care checks   7. Fluids/Electrolytes/Nutrition: Routine Is and Os and follow-up chemistries             -protein supplements for hypoalbuminemia             -continue folic acid, G18             -continue MVI   1/18- will check with pharmacy if needs B12 shots AND daily pills.  8: Chronic back pain/radiculopathy:  -continue Dilaudid 4 mg q 6 hours - is her home medicine (PTA per Dr. [name])             -continue gabapentin 600 mg TID             -continue Lidoderm patch daily             -continue Flexeril 10 mg TID prn   9: Urinary retention: Foley replaced on 1/15             -treated for UTI with Rocephin for  7  days   1/18- will start Flomax 0.4 mg qsupper and will try and remove Foley in AM- explained will need in/out caths likely before bladder muscle kicks in and she can void.   1/19- foley  out this Am at 6am- hasn't voided yet- added bladder scans q6 hours and then cath of volumes /350cc- pt made aware can take a few days to kick in/the flomax. Has voided some and cathed x1-   1/20- no more caths require-d voiding on her own- con't flomax  1/22- d/c bladder scans since voiding well- only required 1 cath since foley removed fyi.  1/21 voiding well  10: Hypertension: monitor TID and prn             -continue Lasix 40 mg daily             -continue Lopressor 50 mg BID             -add magnesium gluconate '250mg'$  HS   1/22- BP controlled- con't regimen 11: GERD?/GI prophylaxis: continue Protonix   12: B12 deficiency: continue supplementation   1/19- says itches with B12 shots, but just takes benadryl 13: Class 3 obesity: BMI = 48; weight loss counseling   14: Hyponatremia, mild: follow-up BMP   15: Elevated serum creatinine; 0.75 1/16 to 1.06 on 1/17             -follow-up BMP   1/18- stable- con't to monitor weekly and prn 16: GBS: continue to monitor NIFs/VIC q 12 hours             -continue gabapentin   1/19- will increase gabapaentin to 900 mg TID  1/20- hasn't kicked in yet- but usually takes a few days 17: Glaucoma: continue latanoprost eye drops   18: Tobacco use: cessation counseling   19. Constipation  1/18- will con't Senna per pt request- to not increase- but if no BM by tomorrow, will try Sorbitol.   1/19- LBM yesterday  1/20- LBM x2 in last 24 hours 20.. insomnia  1/20- will start Melatonin 5 mg QHS prn- don't want trazodone since can cause urinary retention  1/22- sleeping a littl ebetter   I spent a total of 35   minutes on total care today- >50% coordination of care- due to  Called Ortho spoke to Hilbert Odor- he said he will come to do injections for [place], since [place] doesn't usually send a surgeon up here for injections.  Sent him a secure chat after speaking with him per his request.   LOS: 5 days A FACE TO FACE EVALUATION WAS  PERFORMED  Dana [name] 10/07/2022, 10:21 AM

## 2022-10-07 NOTE — Progress Notes (Signed)
Pt refused to allow PA to administer the injection to her knee.

## 2022-10-07 NOTE — Progress Notes (Addendum)
Occupational Therapy Session Note  Patient Details  Name: Dana [name] MRN: [id number] Date of Birth: August 24, 1966  Today's Date: 10/08/2022 OT Individual Time: 9518-8416 OT Individual Time Calculation (min): 55 min   Today's Date: 10/08/2022 OT Individual Time: 1304-1400 OT Individual Time Calculation (min): 56 min   Short Term Goals: Week 1:  OT Short Term Goal 1 (Week 1): Pt will power up to stand at grab bar or RW with mod A during LB dressing OT Short Term Goal 2 (Week 1): Pt will complete grooming and UB self care routine seated sink side with set up OT Short Term Goal 3 (Week 1): Pt will don LB garments over feet with AE with min a  Skilled Therapeutic Interventions/Progress Updates:   Session 1: Pt received sitting in [facility] for skilled OT session with focus on STS transfers and standing tolerance in preparation for ADL participation. Pt agreeable to interventions, despite demonstrating overall flat mood/affect. Pt with un-rated pain, stating "I took my medication this morning so I'm fine." OT offering intermediate rest breaks and positioning suggestions throughout session to address pain/fatigue and maximize participation/safety in session.   Pt dependent for WC transport from room<>therapy gym for time management. In therapy gym, pt performs 3 STS transfers with Min-light Mod A + HDRW with +2 for knee blocking. Pt tolerates standing for ~1-2 mins before requiring seated-rest breaks.   Pt's WC arm-rests flipped and wrapped with coband for improved facilitation of transfer. Pt's HDRW's hand-grips also wrapped with coband for increased friction/input due to pt's decreased bilateral hand-strength/coordination/sensation.   Pt attempts to participate in table-top activity in standing but is unable to complete activity due strength/balance deficits, pt able to only place 3/9-pegs on pegboard before becoming unsteady, activity graded down to continued STS practice.   Pt remained seated in  [facility] with all immediate needs met at end of session. Pt continues to be appropriate for skilled OT intervention to promote further functional independence.   Session 2: Pt received resting in bed for skilled OT session with focus on UE strengthening, toileting, and discharge planning. Pt agreeable to interventions, despite demonstrating overall flat mood/affect. Pt with un-rated pain in R-knee. OT offering intermediate rest breaks and positioning suggestions throughout session to address pain/fatigue and maximize participation/safety in session.   Pt declining OOB activity due to R-knee injection/pain. Seated in bed, pt participates in series of UB strengthening exercises: -Bicep curls -Punches -Abduction/Adduction  -Shoulder flexion/extension  Pt completes 2 sets/10 reps of each exercise with 1lb DB and multimodal cuing for correct form. Pt performs flexion/extension & abduction/adduction of L-arm with no weight due increased weakness.  Pt and OT discuss BSC/TTB, pt shown images of both, with plan to perform transfers in simulated apartment. Pt and OT then discuss appropriate/realistic goals as patient learned of her discharge date prior to this session.  Pt with urgency, performs STS onto stedy/BSC with CGA. Pt requires A for clothing management, completing peri-care with forward leans.   Pt remained resting in bed with all immediate needs met at end of session. Pt continues to be appropriate for skilled OT intervention to promote further functional independence.   Therapy Documentation Precautions:  Precautions Precautions: Fall Precaution Comments: weakness, numbness L anterior thigh. Restrictions Weight Bearing Restrictions: No   Therapy/Group: Individual Therapy  Maudie Mercury, OTR/L, MSOT  10/08/2022, 12:45 PM

## 2022-10-07 NOTE — Progress Notes (Signed)
Occupational Therapy Session Note  Patient Details  Name: Dana [name] MRN: [id number] Date of Birth: 1966-08-27  Today's Date: 10/07/2022 OT Individual Time: 1120-1202 OT Individual Time Calculation (min): 42 min   Short Term Goals: Week 1:  OT Short Term Goal 1 (Week 1): Pt will power up to stand at grab bar or RW with mod A during LB dressing OT Short Term Goal 2 (Week 1): Pt will complete grooming and UB self care routine seated sink side with set up OT Short Term Goal 3 (Week 1): Pt will don LB garments over feet with AE with min a  Skilled Therapeutic Interventions/Progress Updates:  Pt received seated in [facility] for skilled OT session with focus on re-education to rehab structure/procedures and bilat UE coordination. Pt agreeable to interventions, despite overall frustrated mood. Pt with no reports of pain this session. OT offering intermediate rest breaks and positioning suggestions throughout session to address potential pain/fatigue and maximize participation/safety in session.   Pt requires increased time to voice frustrations with current level of functioning and pace of rehab treatment. Re-education provided on goal of therapeutic disciplines, purpose of treatment team meetings, and re-assurance pt is not going to be "kicked out." Pt receptive to education and active listening of thoughts/feelings.   Pt dependent for WC transport from room<>therapy gym for time management. In therapy gym, pt participates in table-top card activity, targeting bilat hand coordination needed to flip/stack playing cards. Pt with greater incoordination noted in R>L hand. Pt then issued weighted spoon, foam tubbing, and plate guard for decreased spillage of food during meals. Pt to trial with superivison provided by nursing staff.   Pt remained seated in [facility] with all immediate needs met at end of session. Pt continues to be appropriate for skilled OT intervention to promote further functional independence.    Therapy Documentation Precautions:  Precautions Precautions: Fall Precaution Comments: weakness, numbness L anterior thigh. Restrictions Weight Bearing Restrictions: No   Therapy/Group: Individual Therapy  Maudie Mercury, OTR/L, MSOT  10/07/2022, 7:57 AM

## 2022-10-07 NOTE — Progress Notes (Signed)
Initial Nutrition Assessment  DOCUMENTATION CODES:   Not applicable  INTERVENTION:  - No nutrition interventions warranted at this time.   REASON FOR ASSESSMENT:   Malnutrition Screening Tool    ASSESSMENT:   57 y.o. female admits to CIR related to functional deficits in setting of Guillain Barre syndrome. PMH includes: IBS, fibromyalgia, HTN, HLD, lumbar radiculopathy.  Meds reviewed:  folic acid, lasix, magnesium gluconate, MVI, senokot, Vit B12. Labs reviewed: Na low, BUN/creatinine elevated.   Pt reports that she has been eating well since admission. She reports that she was eating well PTA. She also denies any wt changes. No nutrition interventions warranted at this time.    NUTRITION - FOCUSED PHYSICAL EXAM:  WNL - no wasting noted.   Diet Order:   Diet Order             Diet regular Room service appropriate? Yes; Fluid consistency: Thin  Diet effective now                   EDUCATION NEEDS:      Skin:  Skin Assessment: Reviewed RN Assessment  Last BM:  10/07/22  Height:   Ht Readings from Last 1 Encounters:  10/02/22 '5\' 4"'$  (1.626 m)    Weight:   Wt Readings from Last 1 Encounters:  10/08/22 123.2 kg    Ideal Body Weight:     BMI:  Body mass index is 46.62 kg/m.  Estimated Nutritional Needs:   Kcal:  3361-2244 kcals  Protein:  90-115 gm  Fluid:  >/= 1.8 L  Thalia Bloodgood, RD, LDN, CNSC.

## 2022-10-07 NOTE — Progress Notes (Signed)
RT NOTE:  Patient performed NIF/VC with great effort with following results:  NIF: greater than -40 VC: 2.4L

## 2022-10-07 NOTE — Progress Notes (Signed)
Occupational Therapy Session Note  Patient Details  Name: Dana [name] MRN: [id number] Date of Birth: 17-Aug-1966  Today's Date: 10/07/2022 OT Individual Time: 0802-0900 OT Individual Time Calculation (min): 58 min    Short Term Goals: Week 1:  OT Short Term Goal 1 (Week 1): Pt will power up to stand at grab bar or RW with mod A during LB dressing OT Short Term Goal 2 (Week 1): Pt will complete grooming and UB self care routine seated sink side with set up OT Short Term Goal 3 (Week 1): Pt will don LB garments over feet with AE with min a  Skilled Therapeutic Interventions/Progress Updates:  Pt bed level upon OT arrival recounting all of the therapy activity completed this weekend. Was awaiting am meal to be delivered. Repositioned for bed level UB bathing and optimal self feeding success. Pt now with covered adapted cup OT assisted with set up for increased accuracy for coordination with reduced spillage noted. Pt agreeable to EOB for LB dressing. Supine sitting with bed features only and close S. Instructed in reacher use for functional reach to feet to don pull on pants over feet with CGA. Use of STEDY for sit to stand with 1 person assist and CGA. Min-mod A to pull up pants in STEDY frame. Sinkside oral care with increased time with mild dysmetria noted L>R and dropped toothpaste. Pt able to use reacher with increased time to retrieve from floor. OT provided a HDRW to attempt sit to stand after sink side grooming with walker butted up again sink for support. Worked with pt on pushing up from [facility] then driving hips back to avoid sliding out of chair. Needs 2 person for trial sit to stand at [facility] or use of parallel bars for now. Reported no pain with pain meds on board given by nursing at start of session. Left pt with w/c alarm active, nurse call button in reach and all needs on tray table.   Therapy Documentation Precautions:  Precautions Precautions: Fall Precaution Comments: weakness,  numbness L anterior thigh. Restrictions Weight Bearing Restrictions: No   Therapy/Group: Individual Therapy  Barnabas Lister 10/07/2022, 7:53 AM

## 2022-10-07 NOTE — Progress Notes (Addendum)
Physical Therapy Session Note  Patient Details  Name: Dana [name] MRN: [id number] Date of Birth: 1965-11-19  Today's Date: 10/07/2022 PT Individual Time: 0930-1030 PT Individual Time Calculation (min): 60 min   Short Term Goals: Week 1:  PT Short Term Goal 1 (Week 1): Pt will roll side to side w/ CGA. PT Short Term Goal 2 (Week 1): Pt will transfer sup <> sit w/ min A logroll technique. PT Short Term Goal 3 (Week 1): Pt will transfer sit to stand w/ min A PT Short Term Goal 4 (Week 1): Pt will stand x 5' w/ LRAD (Stedy vs RW). PT Short Term Goal 5 (Week 1): PT to assess gait.  Skilled Therapeutic Interventions/Progress Updates:      Therapy Documentation Precautions:  Precautions Precautions: Fall Precaution Comments: weakness, numbness L anterior thigh. Restrictions Weight Bearing Restrictions: No  Treatment Session 1:   Pt agreeable to PT session with emphasis on transfer training. Pt with unrated posterior knee pain, provided rest and repositioning for relief.   Pt transported total A for time management and energy conservation to ortho gym. PT demonstrated slide board transfer and pt required max A x 2 with transfer as pt with increased fear of falling and poor safety awareness with transfer.   Pt transitioned to blocked practice of sit to stand transfers x 8  with visual feedback from mirror with PT mod assist and progressed to mod A with HDRW.   Pt required +2 for squat pivot transfer to return to w/c and left seated in w/c at bedside with all needs in reach and alarm on.   Attempted to adjust pt w/c (24 x 18) to hemi-height and unsuccessful. Plan to continue planning a strategy to lower patient's w/c height.     Therapy/Group: Individual Therapy  Verl Dicker Verl Dicker PT, DPT  10/07/2022, 7:41 AM

## 2022-10-07 NOTE — Progress Notes (Signed)
NIF: -40 VC: 2.4L  Great patient effort

## 2022-10-08 ENCOUNTER — Telehealth: Payer: Self-pay | Admitting: Surgical

## 2022-10-08 MED ORDER — LIDOCAINE HCL (PF) 1 % IJ SOLN
5.0000 mL | Freq: Once | INTRAMUSCULAR | Status: AC
  Filled 2022-10-08: qty 5

## 2022-10-08 MED ORDER — LIDOCAINE-PRILOCAINE 2.5-2.5 % EX CREA
TOPICAL_CREAM | Freq: Once | CUTANEOUS | Status: AC
  Filled 2022-10-08: qty 5

## 2022-10-08 MED ORDER — LIDOCAINE HCL (PF) 1 % IJ SOLN
5.0000 mL | Freq: Once | INTRAMUSCULAR | Status: AC
  Administered 2022-10-08: 5 mL
  Filled 2022-10-08: qty 5

## 2022-10-08 MED ORDER — BUPIVACAINE HCL (PF) 0.5 % IJ SOLN
10.0000 mL | Freq: Once | INTRAMUSCULAR | Status: AC
  Administered 2022-10-08: 10 mL
  Filled 2022-10-08: qty 10

## 2022-10-08 MED ORDER — METHYLPREDNISOLONE ACETATE 40 MG/ML IJ SUSP
80.0000 mg | Freq: Once | INTRAMUSCULAR | Status: AC
  Administered 2022-10-08: 80 mg via INTRA_ARTICULAR
  Filled 2022-10-08: qty 2

## 2022-10-08 MED ORDER — BUSPIRONE HCL 5 MG PO TABS
5.0000 mg | ORAL_TABLET | Freq: Three times a day (TID) | ORAL | Status: DC
  Administered 2022-10-08 – 2022-10-17 (×27): 5 mg via ORAL
  Filled 2022-10-08 (×29): qty 1

## 2022-10-08 NOTE — Progress Notes (Signed)
Patient ID: Dana [name], female   DOB: 1966-02-15, 56 y.o.   MRN: [id number]  Met with pt to update her regarding team conference goals of CGA-min level and target discharge date of 2/9. She is really trying and trying to stay positive but it is hard and at tines she is down/depressed. Discussed this is normal and the MD has started her on buspar and will see if this helps and can have neuro-psych or psychiatry see her also while here. Discussed she will need to push herself and get through her anxiety of standing and moving with therapy. She will try she has built up some confidence and trust in the therapy team and does not feel like they will drop her. Pt stood today and discussed need to stand before can walk which is what she wants to do. She will work hard and will allow herself time to heal and progress. She expects to be doing more than she is and needs to give herself some grace. Will stop by daily to provide support and continue to work on discharge needs.

## 2022-10-08 NOTE — Progress Notes (Signed)
NIF greater than -40 VC 2.3L  Good patient effort.

## 2022-10-08 NOTE — Progress Notes (Addendum)
PROGRESS NOTE   Subjective/Complaints: Pt reports  that refused the PA for B/L  knee injections because her PA injects lidocaine first and THEN does steroid injection- I will see what I can do, but trying to work on timing- either today or Thursday, but if her PA can come do it, that's fine as well.   Also took Lovenox shot last night- but had refused prior- educated pt that lovenox is gold standard in research of preventing DVT/VTE (educated on these clots).  LBM this AM.   ROS:  Pt denies SOB, abd pain, CP, N/V/C/D, and vision changes Except for HPI Objective:   No results found. Recent Labs    10/07/22 0621  WBC 7.5  HGB 11.0*  HCT 33.7*  PLT 339   Recent Labs    10/07/22 0621  NA 134*  K 4.1  CL 97*  CO2 26  GLUCOSE 131*  BUN 21*  CREATININE 1.09*  CALCIUM 8.6*    Intake/Output Summary (Last 24 hours) at 10/08/2022 0947 Last data filed at 10/08/2022 0815 Gross per 24 hour  Intake 712 ml  Output --  Net 712 ml        Physical Exam: Vital Signs Blood pressure (!) 140/84, pulse 83, temperature 98.3 F (36.8 C), resp. rate 18, height '5\' 4"'$  (1.626 m), weight 123.2 kg, last menstrual period 08/17/2012, SpO2 98 %.     General: awake, alert, appropriate, slumped a little in bed; NAD HENT: conjugate gaze; oropharynx moist CV: regular rate and rhythm; no JVD Pulmonary: CTA B/L; no W/R/R- good air movement GI: soft, NT, ND, (+)BS- protuberant; BMI 46 Psychiatric: appropriate but flat affect; is interactive Neurological: Ox3  MS: no change in knees today- Has R>L knee effusion noted with palpable baker's cyst in back of R knee UE strength 4/5 except distal is 4-/5 LE's- HF 2/5; KE 3+/5; DF 3+/5 and PF 4-/5 B/L  Skin: multiple IV's and blood draw tape areas- Assessment/Plan: 1. Functional deficits which require 3+ hours per day of interdisciplinary therapy in a comprehensive inpatient rehab  setting. Physiatrist is providing close team supervision and 24 hour management of active medical problems listed below. Physiatrist and rehab team continue to assess barriers to discharge/monitor patient progress toward functional and medical goals  Care Tool:  Bathing    Body parts bathed by patient: Right arm, Left arm, Chest, Abdomen, Front perineal area, Right upper leg, Left upper leg, Face   Body parts bathed by helper: Buttocks, Right lower leg, Left lower leg     Bathing assist Assist Level: Moderate Assistance - Patient 50 - 74%     Upper Body Dressing/Undressing Upper body dressing   What is the patient wearing?: Pull over shirt    Upper body assist Assist Level: Moderate Assistance - Patient 50 - 74%    Lower Body Dressing/Undressing Lower body dressing      What is the patient wearing?: Pants, Underwear/pull up     Lower body assist Assist for lower body dressing: Total Assistance - Patient < 25%     Toileting Toileting    Toileting assist Assist for toileting: 2 Helpers     Transfers Chair/bed transfer  Transfers assist     Chair/bed transfer assist level: Dependent - mechanical lift     Locomotion Ambulation   Ambulation assist   Ambulation activity did not occur: Safety/medical concerns          Walk 10 feet activity   Assist  Walk 10 feet activity did not occur: Safety/medical concerns        Walk 50 feet activity   Assist Walk 50 feet with 2 turns activity did not occur: Safety/medical concerns         Walk 150 feet activity   Assist Walk 150 feet activity did not occur: Safety/medical concerns         Walk 10 feet on uneven surface  activity   Assist Walk 10 feet on uneven surfaces activity did not occur: Safety/medical concerns         Wheelchair     Assist Is the patient using a wheelchair?: Yes Type of Wheelchair: Manual    Wheelchair assist level: Minimal Assistance - Patient > 75% Max  wheelchair distance: 50    Wheelchair 50 feet with 2 turns activity    Assist        Assist Level: Minimal Assistance - Patient > 75%   Wheelchair 150 feet activity     Assist      Assist Level: Maximal Assistance - Patient 25 - 49%   Blood pressure (!) 140/84, pulse 83, temperature 98.3 F (36.8 C), resp. rate 18, height '5\' 4"'$  (1.626 m), weight 123.2 kg, last menstrual period 08/17/2012, SpO2 98 %.  Medical Problem List and Plan: 1. 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  Antithrombotics: -DVT/anticoagulation:  Pharmaceutical: Lovenox start 1/17             -antiplatelet therapy: none   3. 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 Mood/Behavior/Sleep: LCSW to evaluate and provide emotional support             -antipsychotic agents: n/a   5. Neuropsych/cognition: This patient is capable of making decisions on her own behalf.   6. Skin/Wound Care: Routine skin care checks   7. Fluids/Electrolytes/Nutrition: Routine Is and Os and follow-up chemistries             -protein supplements for hypoalbuminemia             -continue folic acid, W09             -continue MVI   1/18- will check with pharmacy if needs B12 shots AND daily pills.  8: Chronic back pain/radiculopathy:  -continue Dilaudid 4 mg q 6 hours - is her home medicine (PTA per Dr. [name])             -continue gabapentin 600 mg TID             -continue Lidoderm patch daily             -continue Flexeril 10 mg TID prn   9: Urinary  retention: Foley replaced on 1/15             -treated for UTI with Rocephin for  7 days   1/18- will start Flomax 0.4 mg qsupper and  will try and remove Foley in AM- explained will need in/out caths likely before bladder muscle kicks in and she can void.   1/19- foley out this Am at 6am- hasn't voided yet- added bladder scans q6 hours and then cath of volumes /350cc- pt made aware can take a few days to kick in/the flomax. Has voided some and cathed x1-   1/20- no more caths require-d voiding on her own- con't flomax  1/22- d/c bladder scans since voiding well- only required 1 cath since foley removed fyi.   1/23- voiding well- d/c bladder scans 10: Hypertension: monitor TID and prn             -continue Lasix 40 mg daily             -continue Lopressor 50 mg BID             -add magnesium gluconate '250mg'$  HS   1/22- BP controlled- con't regimen 11: GERD?/GI prophylaxis: continue Protonix   12: B12 deficiency: continue supplementation   1/19- says itches with B12 shots, but just takes benadryl 13: Class 3 obesity: BMI = 48; weight loss counseling   14: Hyponatremia, mild: follow-up BMP   15: Elevated serum creatinine; 0.75 1/16 to 1.06 on 1/17             -follow-up BMP   1/18- stable- con't to monitor weekly and prn 16: GBS: continue to monitor NIFs/VIC q 12 hours             -continue gabapentin   1/19- will increase gabapaentin to 900 mg TID  1/23- slightly better- con't regimen 17: Glaucoma: continue latanoprost eye drops   18: Tobacco use: cessation counseling   19. Constipation  1/18- will con't Senna per pt request- to not increase- but if no BM by tomorrow, will try Sorbitol.   1/23- LBM this AM 20.. insomnia  1/20- will start Melatonin 5 mg QHS prn- don't want trazodone since can cause urinary retention  1/22- sleeping a little better  steroid injection was performed at R knee L using 1% plain Lidocaine then   '40mg'$  /1cc of Kenalog. This was well tolerated.  Cleaned with betadine x3 and allowed to dry- then alcohol then injected using 27 gauge 1.5 inch needle- no bleeding or complications. Also did  Emla cream first for numbing.    F/U in 3 months for steroid injections of R knee.  Lidocaine will kick in 15 minutes- and wear off tonight- the steroid will kick in tomorrow within 24 hours and take up to 72 hours to fully kick in.   I spent a total of  55  minutes on total care today- >50% coordination of care- due to  Injections of R knee as well as team conference and d/w pharmacy to get meds.   Addendum- anxiety is so limiting- so will add Buspar 5 mg TID    LOS: 6 days A FACE TO FACE EVALUATION WAS PERFORMED  Omkar Stratmann 10/08/2022, 9:47 AM

## 2022-10-08 NOTE — Patient Care Conference (Signed)
Inpatient RehabilitationTeam Conference and Plan of Care Update Date: 10/08/2022   Time: 11:52 AM    Patient Name: Dana [name]      Medical Record Number: [id number]  Date of Birth: 04-04-1966 Sex: Female         Room/Bed: 1Z00F/7C94W-96 Payor Info: Payor: Theme park manager / Plan: [place] / Product Type: *No Product type* /    Admit Date/Time:  10/02/2022  1:50 PM  Primary Diagnosis:  Guillain Barr syndrome [facility])  Hospital Problems: Principal Problem:   Guillain Barr syndrome [facility])    Expected Discharge Date: Expected Discharge Date: 10/25/22  Team Members Present: Physician leading conference: Dr. Courtney Heys Social Worker Present: Ovidio Kin, LCSW Nurse Present: Tacy Learn, RN PT Present: Verl Dicker, PT OT Present: Jamey Ripa, OT PPS Coordinator present : Gunnar Fusi, SLP     Current Status/Progress Goal Weekly Team Focus  Bowel/Bladder   Pt is continent of b/b with occasional incontinent episodes. Continent of bowel. LBM: 1/22   Regain continence of b/b.   Assist w/ toileting needs q 2-4 hours & PRN.    Swallow/Nutrition/ Hydration               ADL's   Stedy for functional transfers, LB bathing/dressing with Mod A + AE, UB bathing/dressing with setup-Min A   Mod I at [facility] level, Min A for functional transfers   Continue AE training for ADLs and standing tolerance for decreased caregiver burden.    Mobility   CGA bed, mod STS RW, max squat pivot, +2 slide board transfer (fear of falling, anxiety)   min A / CGA  proprioceptive feedback, transfers (STS, bed to chair)    Communication                Safety/Cognition/ Behavioral Observations               Pain   Pt denies pain.   Remain pain free.   Assess pain q shift & PRN.    Skin   Skin intact.   Maintain skin integrity.  Assess skin q shift & PRN.      Discharge Planning:  Home with husband and grown chidlren to assist, aware will need 24/7 care at  discharge. Pt hopes she does better than current goals   Team Discussion: GBS. Continent B/B. Pain managed with PRN medications. Skin is CDI. Anxiety limiting issue with therapy d/t fear of falling. Buspar added. Stedy for transfers. Stood today with RW min/mod.  Patient on target to meet rehab goals: Therapy to focus on transfers  *See Care Plan and progress notes for long and short-term goals.   Revisions to Treatment Plan:  Medication adjustments, monitor labs  Teaching Needs: Medications, diet modifications, safety, gait/transfer training, etc.   Current Barriers to Discharge: Decreased caregiver support, Weight, and Behavior  Possible Resolutions to Barriers: Family education, nursing education, order recommended DME     Medical Summary Current Status: voiding well since foley removed- wanted R knee injections- continent B/B; chronic back pain  Barriers to Discharge: Behavior/Mood;Medical stability;Morbid Obesity;Weight bearing restrictions;Other (comments)  Barriers to Discharge Comments: needs husband/kids to help-biggest limitation is anxiety- Possible Resolutions to [employer] Focus: CGA bed; mod-max sit-stand; +2 sliding board- fear of falling- very anxious with all movement- using stedy with OT; did R knee steroid injection today- stood with OT today- min-mod A- lack of acceptance is a larger accetper- d/c 2/9   Continued Need for Acute Rehabilitation Level of Care: The patient  requires daily medical management by a physician with specialized training in physical medicine and rehabilitation for the following reasons: Direction of a multidisciplinary physical rehabilitation program to maximize functional independence : Yes Medical management of patient stability for increased activity during participation in an intensive rehabilitation regime.: Yes Analysis of laboratory values and/or radiology reports with any subsequent need for medication adjustment and/or medical  intervention. : Yes   I attest that I was present, lead the team conference, and concur with the assessment and plan of the team.   Ernest Pine 10/08/2022, 4:10 PM

## 2022-10-08 NOTE — Telephone Encounter (Signed)
Patient would like to know if Lurena Joiner could come to the hospital and give a shot. [phone]

## 2022-10-08 NOTE — Progress Notes (Signed)
Occupational Therapy Session Note  Patient Details  Name: Dana [name] MRN: [id number] Date of Birth: 1966/07/06  Today's Date: 10/08/2022 OT Individual Time: 1500-1535 OT Individual Time Calculation (min): 35 min    Short Term Goals: Week 1:  OT Short Term Goal 1 (Week 1): Pt will power up to stand at grab bar or RW with mod A during LB dressing OT Short Term Goal 2 (Week 1): Pt will complete grooming and UB self care routine seated sink side with set up OT Short Term Goal 3 (Week 1): Pt will don LB garments over feet with AE with min a  Skilled Therapeutic Interventions/Progress Updates:   Pt seen for short scheduled OT session this pm. Pt resting in bed due to knee injection by MD earlier day. OT focus on increased distal hand/finger control for functional task success. Pt reports worsening in distal control however OT noted the same presentation of dysmetria and overshooting L >R hand on eval. Will continue to monitor. OT trained pt in use of Dycem for increased grasp on functional items with + success with trials. OT fabricated tip to thumb pinch coordination and strength task using cut up foam cubes of varying sizes to increased pt's graded contol and need for varied accomodation and place in resistive lidded container. Pt needed increased time and visual assist for accuracy. OT trialed .25 lb weighted cuff for 1 lb bar there ex with no improvement noted with weight vs without. OT provided educ with pt and DIL who was bedside re: recovery and need for time and therapy to be able to improve function as no one recovers the same. Left pt bed level with all safety measures in place, bed exit engaged and needs in reach.   Therapy Documentation Precautions:  Precautions Precautions: Fall Precaution Comments: weakness, numbness L anterior thigh. Restrictions Weight Bearing Restrictions: No    Therapy/Group: Individual Therapy  Barnabas Lister 10/08/2022, 7:38 AM

## 2022-10-08 NOTE — H&P (Incomplete)
Physician Discharge Summary  Patient ID: Dana [name] MRN: [id number] DOB/AGE: Apr 13, 1966 57 y.o.  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 Constipation Hypertension Glaucoma Tobacco use B12 deficiency Urinary retention Chronic back pain/radiculopathy  Primary OA of right knee  Discharged Condition: {condition:18240}  Significant Diagnostic Studies:  Labs:  Basic Metabolic Panel: Recent Labs  Lab 10/02/22 1101 10/03/22 0633 10/07/22 0621  NA 133* 133* 134*  K 4.3 4.8 4.1  CL 98 97* 97*  CO2 '27 24 26  '$ GLUCOSE 110* 104* 131*  BUN 16 18 21*  CREATININE 1.06* 1.02* 1.09*  CALCIUM 8.5* 8.9 8.6*  MG 1.7 1.9  --   PHOS 5.5*  --   --     CBC: Recent Labs  Lab 10/03/22 0633 10/07/22 0621  WBC 4.9 7.5  NEUTROABS 2.8  --   HGB 12.1 11.0*  HCT 37.8 33.7*  MCV 92.9 93.1  PLT PLATELET CLUMPS NOTED ON SMEAR, UNABLE TO ESTIMATE 339    Brief HPI:   Dana [name] is a 57 y.o. female who presented to the [facility] ED on 09/23/2022 with bilateral lower extremity weakness with decreased mobility that has progressed to both arms. She also reported numbness and tingling of the extremities. She was transferred to [facility] for MRI. 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 Albuminocytologic dissociation on LP under fluoro performed on 1/10. She received IVIG for 2 g/kg daily divided over 5 days. NIF's/VIC every 12 hours continue. Foley catheter placed 1/10 and urine culture obtained positive for E. coli and Klebsiella pneumoniae. She was treated with Rocephin for 7 days. Foley catheter was discontinued, however she developed urinary retention and Foley catheter was placed on 1/15. Home medications continued for chronic back pain/radiculopathy, hypertension, acid reflux, B12 deficiency, glaucoma. She has had improvement in extremity weakness. Last NIF  -40 and VC 2.2L performed on 1/14. She is tolerating a regular diet.   Hospital Course: Dana [name] was admitted to rehab 10/02/2022 for inpatient therapies to consist of PT, ST and OT at least three hours five days a week. Past admission physiatrist, therapy team and rehab RN have worked together to provide customized collaborative inpatient rehab. Started on Flomax 0.4 mg daily as she still had indwelling Foley cath. 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 Foley discontinued on 1/19 and bladder scans performed. Required in and out caths. Insomnia treated with melatonin. Voiding well 1/20>>continued Flomax. NIF >-40 consistently. Steroid injection of right kne by Dr. Dagoberto Ligas on 1/23.    Blood pressures were monitored on TID basis and was controlled on continuation of her Lasix 40 mg daily, Lopressor 50 mg BID. Added magnesium gluconate 250 mg q HS.  Rehab course: During patient's stay in rehab weekly team conferences were held to monitor patient's progress, set goals and discuss barriers to discharge. At admission, patient required total assist with basic self-care skills and max assist with mobility.  She  has had improvement in activity tolerance, balance, postural control as well as ability to compensate for deficits. She has had improvement in functional use RUE/LUE  and RLE/LLE as well as improvement in awareness       Disposition:  There are no questions and answers to display.         Diet:  Special Instructions:   Allergies as of 10/08/2022       Reactions  Lisinopril Anaphylaxis   was hospitalized for 3 days   Codeine Hives   Cyanocobalamin [vitamin B12] Itching   Patient able to take B12 injections with Benadryl.  Has no issues when taking B12 tablets.    Hydrocodone Hives   Penicillins Itching, Swelling   PATIENT HAS HAD A PCN REACTION WITH IMMEDIATE RASH, FACIAL/TONGUE/THROAT SWELLING, SOB, OR LIGHTHEADEDNESS WITH HYPOTENSION:  #  #  YES  #   #  Has patient had a PCN reaction causing severe rash involving mucus membranes or skin necrosis: No Has patient had a PCN reaction that required hospitalization No Has patient had a PCN reaction occurring within the last 10 years: No If all of the above answers are "NO", then may proceed with Cephalosporin use.   Amlodipine Swelling   Acetaminophen Rash     Med Rec must be completed prior to using this [facility]***       Follow-up Information     Lovorn, Jinny Blossom, MD Follow up.   Specialty: Physical Medicine and Rehabilitation Why: office will call you to arrange your appt (sent) Contact information: [address] [place] [address] [phone]         [name], MD Follow up.   Specialty: Family Medicine Why: Call the office in 1-2 days to make arrangements for hospital follow-up appointment. Contact information: [place] [address] [phone]         [facility] Follow up.   Why: Call the office in 1-2 days to make arrangements for hospital follow-up appointment. Contact information: [address]     [place] [address] [phone]                Signed: Barbie Banner 10/08/2022, 4:50 PM

## 2022-10-08 NOTE — Progress Notes (Signed)
Physical Therapy Session Note  Patient Details  Name: Dana [name] MRN: [id number] Date of Birth: May 30, 1966  Today's Date: 10/08/2022 PT Individual Time: 0800-0900 PT Individual Time Calculation (min): 60 min   Short Term Goals: Week 1:  PT Short Term Goal 1 (Week 1): Pt will roll side to side w/ CGA. PT Short Term Goal 2 (Week 1): Pt will transfer sup <> sit w/ min A logroll technique. PT Short Term Goal 3 (Week 1): Pt will transfer sit to stand w/ min A PT Short Term Goal 4 (Week 1): Pt will stand x 5' w/ LRAD (Stedy vs RW). PT Short Term Goal 5 (Week 1): PT to assess gait.  Skilled Therapeutic Interventions/Progress Updates:      Therapy Documentation Precautions:  Precautions Precautions: Fall Precaution Comments: weakness, numbness L anterior thigh. Restrictions Weight Bearing Restrictions: No  Pt received semi-reclined in bed with MD present for rounding. Pt reports knee pain improved compared to yesterday following rest and cryotherapy.   Pt requests need to toilet 2/2 upset stomach and required supervision for supine to sit with use of bed features. Pt CGA for stedy transfer from bed level and with positive bowel movement, documented in flowsheet. Pt requires total A for posterior peri-care and supervision for anterior peri-care, oral care, and face washing at sink in w/c.   Pt requires mod A for lower body dressing and utilized Stedy for STS while donning clothing for time management.  Pt left seated in w/c at bedside with all needs in reach and alarm on.      Therapy/Group: Individual Therapy  Verl Dicker Verl Dicker PT, DPT  10/08/2022, 7:41 AM

## 2022-10-09 MED ORDER — CITALOPRAM HYDROBROMIDE 20 MG PO TABS
20.0000 mg | ORAL_TABLET | Freq: Every day | ORAL | Status: DC
  Administered 2022-10-09 – 2022-10-23 (×15): 20 mg via ORAL
  Filled 2022-10-09 (×16): qty 1

## 2022-10-09 NOTE — Progress Notes (Signed)
Recreational Therapy Session Note  Patient Details  Name: Dana [name] MRN: [id number] Date of Birth: 1966-08-12 Today's Date: 10/09/2022 No c/o pain  Pt participated in animal assisted activity seated w/c level with supervision.  Pt appreciative of this visit.  [place] 10/09/2022, 1:29 PM

## 2022-10-09 NOTE — Progress Notes (Signed)
Occupational Therapy Session Note  Patient Details  Name: Dana [name] MRN: [id number] Date of Birth: 08/12/1966  Today's Date: 10/09/2022 OT Individual Time: 9622-2979 OT Individual Time Calculation (min): 55 min   Today's Date: 10/09/2022 OT Individual Time: 1435-1530 OT Individual Time Calculation (min): 55 min   Short Term Goals: Week 1:  OT Short Term Goal 1 (Week 1): Pt will power up to stand at grab bar or RW with mod A during LB dressing OT Short Term Goal 2 (Week 1): Pt will complete grooming and UB self care routine seated sink side with set up OT Short Term Goal 3 (Week 1): Pt will don LB garments over feet with AE with min a  Skilled Therapeutic Interventions/Progress Updates:   Session 1: Pt received resting in [facility] for skilled OT session with focus on functional transfers and standing/activity tolerance. Pt agreeable to interventions, demonstrating overall pleasant mood. Pt with un-rated pain. OT offering intermediate rest breaks and positioning suggestions throughout session to address pain/fatigue and maximize participation/safety in session.   Pt dependent for WC transport from room<>ortho gym for time management. In ortho gym, pt performs WC<>EOM transfer with use of transfer board, +2 assistance for patient comfort, and overall +1 Mod A. Pt's sits EOB with supervision and BUE/BLEs supported for ~2 mins.   Pt then performs STS x2 with Min-light Mod A + HDRW, tolerating ~1 min of standing each trial with CGA + HDRW.   Pt requiring extended rest breaks for pain and anxiety management, benefiting from step-by-step instruction of expected movements.   Pt remained seated in [facility] with son present and all immediate needs met at end of session. Pt continues to be appropriate for skilled OT intervention to promote further functional independence.   Session 2: Pt received seated in recliner for skilled OT session with focus on home layout, DME education, and FM improvement. Pt  agreeable to interventions, despite demonstrating overall frustrated mood. Pt with no reports of pain, but states "I dropped my soda all over me. . . It's all so frustrating" in reference to current functioning. OT providing space to share feelings/thoughts with non-judgmental active listening.   Conversation transitions to [employer] and general accessibility. Pt shares limited knowledge about household measurements, so home measurement sheet issued. Pt and OT discuss potential places patient can place [facility] during the day as her bathroom is not wheelchair accessible, emphasize placed on drop-arm feature and reasoning behind practicing transfers in previous/future sessions.   Pt then performs series of bilateral hand-strengthening exercises with use of yellow thera-putty. Pt rolls thera-putty with focus placed on full digit extension for dysmetria. Pt also completes gross grip and individual digital pinches on thera-putty. Pt educated on the importance of using visual input (ie."Look at your hands") to compensate for decreased sensation in bilateral hands.   Time spent communicating with pt's RN/PA for ACE wrapping orders as patient currently demonstrates increased edema in bilateral feet.   Pt remained resting in recliner with all immediate needs met at end of session. Pt continues to be appropriate for skilled OT intervention to promote further functional independence.   Therapy Documentation Precautions:  Precautions Precautions: Fall Precaution Comments: weakness, numbness L anterior thigh. Restrictions Weight Bearing Restrictions: No   Therapy/Group: Individual Therapy  Maudie Mercury, OTR/L, MSOT  10/09/2022, 8:05 AM

## 2022-10-09 NOTE — Progress Notes (Signed)
Physical Therapy Session Note  Patient Details  Name: Dana [name] MRN: [id number] Date of Birth: 05/15/1966  Today's Date: 10/09/2022 PT Individual Time: 0800-0845 PT Individual Time Calculation (min): 45 min   Short Term Goals: Week 1:  PT Short Term Goal 1 (Week 1): Pt will roll side to side w/ CGA. PT Short Term Goal 2 (Week 1): Pt will transfer sup <> sit w/ min A logroll technique. PT Short Term Goal 3 (Week 1): Pt will transfer sit to stand w/ min A PT Short Term Goal 4 (Week 1): Pt will stand x 5' w/ LRAD (Stedy vs RW). PT Short Term Goal 5 (Week 1): PT to assess gait.  Skilled Therapeutic Interventions/Progress Updates:      Therapy Documentation Precautions:  Precautions Precautions: Fall Precaution Comments: weakness, numbness L anterior thigh. Restrictions Weight Bearing Restrictions: No  Pt agreeable to PT session with emphasis on sit to stand transfers. Pt with right knee pain 4/10, nursing notified and administered medication in session. Pt with increased anxiety and requires comfort and encouragement throughout session.   Pt transported total A for time management by w/c to dayroom. Pt participated in blocked practice of sit to stand transfers x 5 with HDRW with CGA/min A.Pt able to tolerate standing ~10-20  seconds and attempted lateral weight shifting and heel raises, pt with increased anxiety and deferred activity. Pt requires +2 assist to stabilize HDRW for anxiety with transfers.   Pt transported total A for time management and left seated in w/c at bedside with all needs in reach.    Therapy/Group: Individual Therapy  Verl Dicker Verl Dicker PT, DPT  10/09/2022, 7:44 AM

## 2022-10-09 NOTE — Progress Notes (Signed)
Orthopedic Tech Progress Note Patient Details:  Dana [name] 05-26-1966 [id number]  Ortho Devices Type of Ortho Device: Velcro wrist splint Ortho Device/Splint Location: BLE Ortho Device/Splint Interventions: Ordered, Application, Adjustment, Removal   Post Interventions Patient Tolerated: Well Instructions Provided: Care of device  [name] 10/09/2022, 10:09 AM

## 2022-10-09 NOTE — Progress Notes (Signed)
Physical Therapy Session Note  Patient Details  Name: Dana [name] MRN: [id number] Date of Birth: November 11, 1965  Today's Date: 10/09/2022 PT Individual Time: 1140-1210 PT Individual Time Calculation (min): 30 min   Short Term Goals: Week 1:  PT Short Term Goal 1 (Week 1): Pt will roll side to side w/ CGA. PT Short Term Goal 2 (Week 1): Pt will transfer sup <> sit w/ min A logroll technique. PT Short Term Goal 3 (Week 1): Pt will transfer sit to stand w/ min A PT Short Term Goal 4 (Week 1): Pt will stand x 5' w/ LRAD (Stedy vs RW). PT Short Term Goal 5 (Week 1): PT to assess gait.   Skilled Therapeutic Interventions/Progress Updates:  Patient seated upright in w/c on entrance to room. Patient alert and not initially agreeable to PT session as she did not think she had another session prior to lunch. New schedule printed for pt in order to increase awareness of schedule for rest of day.  Patient with no pain complaint at start of session. Injection from MD the previous day has helped with pt's pain levels in knee. However, pt continues to c/o increased swelling in hands and LE s.   Neuromuscular Re-ed/ Therex: Pt performed the following seated exercises with vc/ tc for proper technique. Yellow theraband used. BLE LAQs x15  BLE HS curls x15 BLE hip abd x15 BLE resisted ankle PF with eccentric controlled DF x20   NMR performed for improvements in motor control and coordination, balance, sequencing, judgement, and self confidence/ efficacy in performing all aspects of mobility at highest level of independence.   Patient seated upright in w/c  at end of session with brakes locked, belt alarm set, and all needs within reach. Lunch tray arriving.    Therapy Documentation Precautions:  Precautions Precautions: Fall Precaution Comments: weakness, numbness L anterior thigh. Restrictions Weight Bearing Restrictions: No General:   Vital Signs: Therapy Vitals Temp: 97.9 F (36.6  C) Pulse Rate: 84 Resp: 18 BP: (!) 152/89 Patient Position (if appropriate): Sitting Oxygen Therapy SpO2: 99 % O2 Device: Room Air Pain:  Pt related improved pain levels in knee since receiving injection in knee from Dr. Dagoberto Ligas.   Therapy/Group: Individual Therapy  Alger Simons PT, DPT, CSRS 10/09/2022, 2:43 PM

## 2022-10-09 NOTE — Telephone Encounter (Signed)
I spoke with Dr. Dagoberto Ligas and offered to come do injection as long as that was okay with her.  She has actually done the injection already.

## 2022-10-09 NOTE — Progress Notes (Signed)
PROGRESS NOTE   Subjective/Complaints:  Pt reports knee injection on R is working- feels better this AM- was painful last night.  But much better this AM.   Asking about prognosis- was thinking it would be "better in 1 month"- educated it will be 6-12 months.   Needs wrist splints at night for curling of hands/fingers.    ROS:  Pt denies SOB, abd pain, CP, N/V/C/D, and vision changes  Except for HPI Objective:   No results found. Recent Labs    10/07/22 0621  WBC 7.5  HGB 11.0*  HCT 33.7*  PLT 339   Recent Labs    10/07/22 0621  NA 134*  K 4.1  CL 97*  CO2 26  GLUCOSE 131*  BUN 21*  CREATININE 1.09*  CALCIUM 8.6*    Intake/Output Summary (Last 24 hours) at 10/09/2022 0900 Last data filed at 10/09/2022 0734 Gross per 24 hour  Intake 593 ml  Output --  Net 593 ml        Physical Exam: Vital Signs Blood pressure 133/73, pulse 81, temperature 98.5 F (36.9 C), resp. rate 15, height '5\' 4"'$  (1.626 m), weight 123.2 kg, last menstrual period 08/17/2012, SpO2 96 %.      General: awake, alert, appropriate, sitting up in bed; NAD HENT: conjugate gaze; oropharynx moist CV: regular rate; no JVD Pulmonary: CTA B/L; no W/R/R- good air movement GI: soft, NT, ND, (+)BS- protuberant Psychiatric: appropriate- but still anxious- appears depressed when discussing prognosis Neurological: Ox3  MS: less TTP in R knee- effusion slightly better UE strength 4/5 except distal is 4-/5 LE's- HF 2/5; KE 3+/5; DF 3+/5 and PF 4-/5 B/L  Skin: multiple IV's and blood draw tape areas- Assessment/Plan: 1. Functional deficits which require 3+ hours per day of interdisciplinary therapy in a comprehensive inpatient rehab setting. Physiatrist is providing close team supervision and 24 hour management of active medical problems listed below. Physiatrist and rehab team continue to assess barriers to discharge/monitor patient  progress toward functional and medical goals  Care Tool:  Bathing    Body parts bathed by patient: Right arm, Left arm, Chest, Abdomen, Front perineal area, Right upper leg, Left upper leg, Face   Body parts bathed by helper: Buttocks, Right lower leg, Left lower leg     Bathing assist Assist Level: Moderate Assistance - Patient 50 - 74%     Upper Body Dressing/Undressing Upper body dressing   What is the patient wearing?: Pull over shirt    Upper body assist Assist Level: Moderate Assistance - Patient 50 - 74%    Lower Body Dressing/Undressing Lower body dressing      What is the patient wearing?: Pants, Underwear/pull up     Lower body assist Assist for lower body dressing: Total Assistance - Patient < 25%     Toileting Toileting    Toileting assist Assist for toileting: 2 Helpers     Transfers Chair/bed transfer  Transfers assist     Chair/bed transfer assist level: Dependent - mechanical lift     Locomotion Ambulation   Ambulation assist   Ambulation activity did not occur: Safety/medical concerns  Walk 10 feet activity   Assist  Walk 10 feet activity did not occur: Safety/medical concerns        Walk 50 feet activity   Assist Walk 50 feet with 2 turns activity did not occur: Safety/medical concerns         Walk 150 feet activity   Assist Walk 150 feet activity did not occur: Safety/medical concerns         Walk 10 feet on uneven surface  activity   Assist Walk 10 feet on uneven surfaces activity did not occur: Safety/medical concerns         Wheelchair     Assist Is the patient using a wheelchair?: Yes Type of Wheelchair: Manual    Wheelchair assist level: Minimal Assistance - Patient > 75% Max wheelchair distance: 50    Wheelchair 50 feet with 2 turns activity    Assist        Assist Level: Minimal Assistance - Patient > 75%   Wheelchair 150 feet activity     Assist      Assist  Level: Maximal Assistance - Patient 25 - 49%   Blood pressure 133/73, pulse 81, temperature 98.5 F (36.9 C), resp. rate 15, height '5\' 4"'$  (1.626 m), weight 123.2 kg, last menstrual period 08/17/2012, SpO2 96 %.  Medical Problem List and Plan: 1. Functional deficits secondary to GBS             -patient may shower             -ELOS/Goals: 2-3 weeks             Con't CIR- PT and OT  D/c 10/25/21  Con't CIR- PT and OT- limited by anxiety and mood- will order cock up wrist splints for night time.  2.  Antithrombotics: -DVT/anticoagulation:  Pharmaceutical: Lovenox start 1/17             -antiplatelet therapy: none   3. 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 Mood/Behavior/Sleep: LCSW to evaluate and provide emotional support             -antipsychotic agents: n/a   1/24- anxiety is limiting therapy- will add Buspar and Celexa for anxiety 5. Neuropsych/cognition: This patient is capable of making decisions on her own behalf.   6. Skin/Wound Care: Routine skin care checks   7. Fluids/Electrolytes/Nutrition: Routine Is and Os and follow-up chemistries             -protein supplements for hypoalbuminemia             -continue folic acid, W43             -continue MVI   1/18- will check with pharmacy if needs B12 shots AND daily pills.  8: Chronic back pain/radiculopathy:  -continue Dilaudid 4 mg q 6 hours - is her home medicine (PTA per Dr. [name])             -continue gabapentin 600 mg TID             -continue Lidoderm patch daily             -continue Flexeril 10 mg TID prn   9: Urinary retention: Foley replaced on 1/15             -  treated for UTI with Rocephin for  7 days   1/18- will start Flomax 0.4 mg qsupper and will try and remove Foley in AM- explained will need in/out caths likely  before bladder muscle kicks in and she can void.   1/19- foley out this Am at 6am- hasn't voided yet- added bladder scans q6 hours and then cath of volumes /350cc- pt made aware can take a few days to kick in/the flomax. Has voided some and cathed x1-   1/20- no more caths require-d voiding on her own- con't flomax  1/22- d/c bladder scans since voiding well- only required 1 cath since foley removed fyi.   1/23- voiding well- d/c bladder scans 10: Hypertension: monitor TID and prn             -continue Lasix 40 mg daily             -continue Lopressor 50 mg BID             -add magnesium gluconate '250mg'$  HS   1/22- BP controlled- con't regimen 11: GERD?/GI prophylaxis: continue Protonix   12: B12 deficiency: continue supplementation   1/19- says itches with B12 shots, but just takes benadryl 13: Class 3 obesity: BMI = 48; weight loss counseling   1/24- BMI down to 46.62 14: Hyponatremia, mild: follow-up BMP   15: Elevated serum creatinine; 0.75 1/16 to 1.06 on 1/17             -follow-up BMP   1/18- stable- con't to monitor weekly and prn 16: GBS: continue to monitor NIFs/VIC q 12 hours             -continue gabapentin   1/19- will increase gabapaentin to 900 mg TID  1/23- slightly better- con't regimen 17: Glaucoma: continue latanoprost eye drops   18: Tobacco use: cessation counseling   19. Constipation  1/18- will con't Senna per pt request- to not increase- but if no BM by tomorrow, will try Sorbitol.   1/23- LBM this AM 20.. insomnia  1/20- will start Melatonin 5 mg QHS prn- don't want trazodone since can cause urinary retention  1/22- sleeping a little better 21. Anxiety  1/24- added Buspar 5 mg TID- will also add Celexa for mood/anxiety prevention 20 mg daily.   I spent a total of  41  minutes on total care today- >50% coordination of care- due to  D/w pt about prognosis as well as time line- also about anxiety and mood.  Discussed will take at least 6-12 months to get  back and there's a chance won't get back 100%.   LOS: 7 days A FACE TO FACE EVALUATION WAS PERFORMED  Dana [name] 10/09/2022, 9:00 AM

## 2022-10-09 NOTE — Progress Notes (Signed)
Pt not in room at this time to do NIF & VC.

## 2022-10-10 LAB — MISC LABCORP TEST (SEND OUT): Labcorp test code: 9985

## 2022-10-10 NOTE — Progress Notes (Signed)
NIF greater than -40 VC 2.2 L Great effort given by patient.

## 2022-10-10 NOTE — Progress Notes (Signed)
Physical Therapy Session Note  Patient Details  Name: Dana [name] MRN: [id number] Date of Birth: [birth year]  Today's Date: 10/10/2022 PT Individual Time: 0800-0845 PT Individual Time Calculation (min): 45 min   Short Term Goals: Week 1:  PT Short Term Goal 1 (Week 1): Pt will roll side to side w/ CGA. PT Short Term Goal 2 (Week 1): Pt will transfer sup <> sit w/ min A logroll technique. PT Short Term Goal 3 (Week 1): Pt will transfer sit to stand w/ min A PT Short Term Goal 4 (Week 1): Pt will stand x 5' w/ LRAD (Stedy vs RW). PT Short Term Goal 5 (Week 1): PT to assess gait.  Skilled Therapeutic Interventions/Progress Updates:    Pt recd as hand off from NT seated in w/c. Pt completed morning ADLs with therapist for activity tolerance and endurance. Cueing for fine motor tasks and strategies at times. Pt able to direct her care for maximum independence. Pt transported to therapy gym for time management and energy conservation. Pt participated in kinetron activity 2 x 3 min at 70 cm/sec for endurance and posterior chain strength. On return to room, pt agreeable to stay in chair d/t following PT session. Pt participated in 2 x 20 marches and LAQ superset for endurance and BLE strength. Pt noted to slide down in chair. Pt was able to correct with cues only and no therapist assist, but only when cued to do so. Pt remained in w/c and was left with all needs in reach and alarm active.   Therapy Documentation Precautions:  Precautions Precautions: Fall Precaution Comments: weakness, numbness L anterior thigh. Restrictions Weight Bearing Restrictions: No General:       Therapy/Group: Individual Therapy  Mickel Fuchs 10/10/2022, 10:44 AM

## 2022-10-10 NOTE — Progress Notes (Signed)
NIF > -40 VC  2.2L  Great effort by patient

## 2022-10-10 NOTE — Progress Notes (Signed)
Physical Therapy Session Note  Patient Details  Name: Dana [name] MRN: [id number] Date of Birth: 02/25/1966  Today's Date: 10/10/2022 PT Individual Time: 0930-1043 PT Individual Time Calculation (min): 73 min   Short Term Goals: Week 1:  PT Short Term Goal 1 (Week 1): Pt will roll side to side w/ CGA. PT Short Term Goal 2 (Week 1): Pt will transfer sup <> sit w/ min A logroll technique. PT Short Term Goal 3 (Week 1): Pt will transfer sit to stand w/ min A PT Short Term Goal 4 (Week 1): Pt will stand x 5' w/ LRAD (Stedy vs RW). PT Short Term Goal 5 (Week 1): PT to assess gait.  Skilled Therapeutic Interventions/Progress Updates:      Therapy Documentation Precautions:  Precautions Precautions: Fall Precaution Comments: weakness, numbness L anterior thigh. Restrictions Weight Bearing Restrictions: No  Pt agreeable to PT session with emphasis on strength training, neuro-muscular re-education and discharge planning. Pt without verbal complaints of pain in session.   Pt engaged in discussion with PT regarding discharge planning. PT informed pt of speciality w/c evaluation scheduled for Monday 1/29 at 8 AM with Deatra Ina from [facility]. Pt provided with home measurement sheet and pt reports she will have it filled out before w/c evaluation. Pt reports her bathroom has narrow doorway and would not fit w/c but would fit walker. PT recommended pt have 24/7 care upon discharge and pt plans to discuss hiring additional assistance with her family.   Pt transported total A for time management and energy conservation to dayroom. Pt engaged in blocked practice of sit to stand transfers x 4 with HDRW and CGA/min A. Pt able to progress to weight shifting in bilateral directions as pre-gait activity.   PT demonstrated SPT with HDRW as goal for mobility to best prepare for discharge.   PT provided vibration sensation at low intensity with wand to address sensory and proprioceptive  deficts x 10 minutes to hands, lower body and feet.   Pt left seated in w/c at bedside with nurse present and all needs within reach.     Therapy/Group: Individual Therapy  Verl Dicker Verl Dicker PT, DPT  10/10/2022, 7:48 AM

## 2022-10-10 NOTE — Progress Notes (Signed)
Orthopedic Tech Progress Note Patient Details:  Dana [name] 1966-07-02 [id number] Called in order for BL resting hand splints Patient ID: [name], female   DOB: Feb 15, 1966, 57 y.o.   MRN: [id number]  Chip Boer 10/10/2022, 10:04 AM

## 2022-10-10 NOTE — Progress Notes (Signed)
PROGRESS NOTE   Subjective/Complaints:  Pt reports doing "well" this AM.  LBM this AM R knee doing well/good- pain wise  Pt reports no issues this AM.     ROS:  Pt denies SOB, abd pain, CP, N/V/C/D, and vision changes   Except for HPI Objective:   No results found. No results for input(s): "WBC", "HGB", "HCT", "PLT" in the last 72 hours.  No results for input(s): "NA", "K", "CL", "CO2", "GLUCOSE", "BUN", "CREATININE", "CALCIUM" in the last 72 hours.   Intake/Output Summary (Last 24 hours) at 10/10/2022 0834 Last data filed at 10/09/2022 1323 Gross per 24 hour  Intake 236 ml  Output --  Net 236 ml        Physical Exam: Vital Signs Blood pressure 127/66, pulse 75, temperature 97.7 F (36.5 C), resp. rate 18, height '5\' 4"'$  (1.626 m), weight 123.5 kg, last menstrual period 08/17/2012, SpO2 99 %.       General: awake, alert, appropriate, sitting EOB; working on getting into stedy with NT; NAD HENT: conjugate gaze; oropharynx moist CV: regular rate; no JVD Pulmonary: CTA B/L; no W/R/R- good air movement GI: soft, NT, ND, (+)BS- BMI 46 Psychiatric: appropriate- brighter affect this AM Neurological: Ox3  MS: less TTP in R knee- effusion slightly better UE strength 4/5 except distal is 4-/5 LE's- HF 2/5; KE 3+/5; DF 3+/5 and PF 4-/5 B/L  Skin: multiple IV's and blood draw tape areas- Assessment/Plan: 1. Functional deficits which require 3+ hours per day of interdisciplinary therapy in a comprehensive inpatient rehab setting. Physiatrist is providing close team supervision and 24 hour management of active medical problems listed below. Physiatrist and rehab team continue to assess barriers to discharge/monitor patient progress toward functional and medical goals  Care Tool:  Bathing    Body parts bathed by patient: Right arm, Left arm, Chest, Abdomen, Front perineal area, Right upper leg, Left upper leg,  Face   Body parts bathed by helper: Buttocks, Right lower leg, Left lower leg     Bathing assist Assist Level: Moderate Assistance - Patient 50 - 74%     Upper Body Dressing/Undressing Upper body dressing   What is the patient wearing?: Pull over shirt    Upper body assist Assist Level: Moderate Assistance - Patient 50 - 74%    Lower Body Dressing/Undressing Lower body dressing      What is the patient wearing?: Pants, Underwear/pull up     Lower body assist Assist for lower body dressing: Total Assistance - Patient < 25%     Toileting Toileting    Toileting assist Assist for toileting: 2 Helpers     Transfers Chair/bed transfer  Transfers assist     Chair/bed transfer assist level: Dependent - mechanical lift     Locomotion Ambulation   Ambulation assist   Ambulation activity did not occur: Safety/medical concerns          Walk 10 feet activity   Assist  Walk 10 feet activity did not occur: Safety/medical concerns        Walk 50 feet activity   Assist Walk 50 feet with 2 turns activity did not occur: Safety/medical concerns  Walk 150 feet activity   Assist Walk 150 feet activity did not occur: Safety/medical concerns         Walk 10 feet on uneven surface  activity   Assist Walk 10 feet on uneven surfaces activity did not occur: Safety/medical concerns         Wheelchair     Assist Is the patient using a wheelchair?: Yes Type of Wheelchair: Manual    Wheelchair assist level: Minimal Assistance - Patient > 75% Max wheelchair distance: 50    Wheelchair 50 feet with 2 turns activity    Assist        Assist Level: Minimal Assistance - Patient > 75%   Wheelchair 150 feet activity     Assist      Assist Level: Maximal Assistance - Patient 25 - 49%   Blood pressure 127/66, pulse 75, temperature 97.7 F (36.5 C), resp. rate 18, height '5\' 4"'$  (1.626 m), weight 123.5 kg, last menstrual period  08/17/2012, SpO2 99 %.  Medical Problem List and Plan: 1. 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  2.  Antithrombotics: -DVT/anticoagulation:  Pharmaceutical: Lovenox start 1/17             -antiplatelet therapy: none   3. 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 Mood/Behavior/Sleep: LCSW to evaluate and provide emotional support             -antipsychotic agents: n/a   1/24- anxiety is limiting therapy- will add Buspar and Celexa for anxiety  1/25- brighter affect this AM 5. Neuropsych/cognition: This patient is capable of making decisions on her own behalf.   6. Skin/Wound Care: Routine skin care checks   7. Fluids/Electrolytes/Nutrition: Routine Is and Os and follow-up chemistries             -protein supplements for hypoalbuminemia             -continue folic acid, G95             -continue MVI   1/18- will check with pharmacy if needs B12 shots AND daily pills.  8: Chronic back pain/radiculopathy:  -continue Dilaudid 4 mg q 6 hours - is her home medicine (PTA per Dr. [name])             -continue gabapentin 600 mg TID             -continue Lidoderm patch daily             -continue Flexeril 10 mg TID prn   9: Urinary retention: Foley replaced on 1/15             -treated for UTI with Rocephin for  7 days   1/18- will start Flomax 0.4 mg qsupper and will try and remove Foley in AM- explained will need in/out caths likely before bladder muscle kicks in and  she can void.   1/19- foley out this Am at 6am- hasn't voided yet- added bladder scans q6 hours  and then cath of volumes /350cc- pt made aware can take a few days to kick in/the flomax. Has voided some and cathed x1-   1/20- no more caths require-d voiding on her own- con't flomax  1/22- d/c bladder scans since voiding well- only required 1 cath since foley removed fyi.   1/23- voiding well- d/c bladder scans 10: Hypertension: monitor TID and prn             -continue Lasix 40 mg daily             -continue Lopressor 50 mg BID             -add magnesium gluconate '250mg'$  HS   1/22- BP controlled- con't regimen 11: GERD?/GI prophylaxis: continue Protonix   12: B12 deficiency: continue supplementation   1/19- says itches with B12 shots, but just takes benadryl 13: Class 3 obesity: BMI = 48; weight loss counseling   1/24- BMI down to 46.62 14: Hyponatremia, mild: follow-up BMP   15: Elevated serum creatinine; 0.75 1/16 to 1.06 on 1/17             -follow-up BMP   1/18- stable- con't to monitor weekly and prn 16: GBS: continue to monitor NIFs/VIC q 12 hours             -continue gabapentin   1/19- will increase gabapaentin to 900 mg TID  1/23- slightly better- con't regimen 17: Glaucoma: continue latanoprost eye drops   18: Tobacco use: cessation counseling   19. Constipation  1/18- will con't Senna per pt request- to not increase- but if no BM by tomorrow, will try Sorbitol.   1/23- LBM this AM 20.. insomnia  1/20- will start Melatonin 5 mg QHS prn- don't want trazodone since can cause urinary retention  1/22- sleeping a little better 21. Anxiety  1/24- added Buspar 5 mg TID- will also add Celexa for mood/anxiety prevention 20 mg daily. 1/25- no side effects so far.       LOS: 8 days A FACE TO FACE EVALUATION WAS PERFORMED  Dana [name] 10/10/2022, 8:34 AM

## 2022-10-10 NOTE — Progress Notes (Signed)
Occupational Therapy Session Note  Patient Details  Name: Dana [name] MRN: [id number] Date of Birth: [birth year]  Today's Date: 10/10/2022 OT Individual Time: 1330-1445  OT Time: 75 min       Short Term Goals: Week 1:  OT Short Term Goal 1 (Week 1): Pt will power up to stand at grab bar or RW with mod A during LB dressing OT Short Term Goal 2 (Week 1): Pt will complete grooming and UB self care routine seated sink side with set up OT Short Term Goal 3 (Week 1): Pt will don LB garments over feet with AE with min a  Skilled Therapeutic Interventions/Progress Updates:   Pt seen for skilled OT session with PT student participation. Pt up in recliner upon OT arrival and eager for session. Skin Team nurses arrived and requested therapy assist with standing for skin inspection. Pt able to stand with cues for weight shifting and hand placement and mod A static while skin team performed inspection. No skin issues noted. Pt rested with OT educating on breathing during standing tasks as pt continues to report fear of falling. Pt has deficits for grasp impacting hand placement but now with COBAN wrapping on RW handles, pt with improved sustained grasp. OT trialed ACE wrapping below pt's B knees to feet to increased support, reduce edema and increase awareness of limbs in standing with + report/results. Educated pt to remove prior to sleep. OT implemented B resting hand splints which were delivered today. Posted signage to don and alternate nightly R/L to allow one hand to access call button. Pt teach back instructions and sign posted. OT inspected skin after 10 of wear with no issues with skin but educated on s/s of issues. Sit to stand 4 more trials with 1st stand up to 1 min 35 sec with unilateral reach for items and max A. 3 other intervals of 1 min with same level of assist due to unilateral support only. Left pt seated in recliner with chair alarm set, needs and nurse call button in reach.   Therapy  Documentation Precautions:  Precautions Precautions: Fall Precaution Comments: weakness, numbness L anterior thigh. Restrictions Weight Bearing Restrictions: No    Therapy/Group: Individual Therapy  Barnabas Lister 10/10/2022, 3:44 PM

## 2022-10-11 NOTE — Progress Notes (Signed)
Pt sleeping NIF/VC not taken

## 2022-10-11 NOTE — Progress Notes (Signed)
Occupational Therapy Session Note  Patient Details  Name: Dana [name] MRN: [id number] Date of Birth: August 13, 1966  Today's Date: 10/11/2022 OT Individual Time: 0915-1000 OT Individual Time Calculation (min): 45 min    Short Term Goals: Week 1:  OT Short Term Goal 1 (Week 1): Pt will power up to stand at grab bar or RW with mod A during LB dressing OT Short Term Goal 2 (Week 1): Pt will complete grooming and UB self care routine seated sink side with set up OT Short Term Goal 3 (Week 1): Pt will don LB garments over feet with AE with min a  Skilled Therapeutic Interventions/Progress Updates:   Pt seen for skilled OT with care coord with MD prior to session to update re: strategies used so far to increase distal coord/motor control during functional tasks. OT transported pt to main gym in w/c due to mobility needs and pt set up seated at table for towel slides x 2 sets of 10 reps forward and horizontally with no pain reported. OT explained rationale and application of SaeboStim One estim use as MD and OT felt this may assist over time with increased awareness of hands as well as increase normal firing as pt presents with significant dysmetria and overshooting with loss of sensation and involuntary mngt at times. Pt agreeable for trial to B wrist extensors for 10 min on each and tolerated without issues (see below). During on time, pt mirrored wrist and finger extension on the opposite hand for graded control work. Meanwhile, OT re-applied ACE wraps to B LE ankles to knees with pt requesting to leave off her feet to give stability for standing trials with therapy. Pt's pants were too constricting to pull up to don under slipper socks therefore donned ACE over pant legs as pt reported decreased edema from yesterday and improved LE awareness. Once stim removed, pt trialed wearing a latex glove to self feed finger food for increased grip. Pt required mod A to don but then felt improved grasp and control  which may carry over to utensil use etc. Care coord with later day OT to continue trials. Back in room, pt left next to bed with chair alarm set, needs and nurse call button in reach.   SaeboStim One Parameters and pt response:  no adverse skin reactions.  330 pulse width 35 Hz pulse rate On 8 sec/ off 8 sec Ramp up/ down 2 sec Symmetrical Biphasic wave form  Max intensity 144m at 500 Ohm load  Therapy Documentation Precautions:  Precautions Precautions: Fall Precaution Comments: weakness, numbness L anterior thigh. Restrictions Weight Bearing Restrictions: No    Therapy/Group: Individual Therapy  EBarnabas Lister1/26/2024, 7:24 AM

## 2022-10-11 NOTE — Progress Notes (Signed)
NIF > -40, VC 1.94 pt had good effort

## 2022-10-11 NOTE — Progress Notes (Signed)
PROGRESS NOTE   Subjective/Complaints:   Pt feels like doing well Anxiety doing better with meds.  Got wrist splints- B/L.  LBM x2 yesterday.   Hands shaking- was wondering if there a a med to treat.    ROS:  Pt denies SOB, abd pain, CP, N/V/C/D, and vision changes  Except for HPI Objective:   No results found. No results for input(s): "WBC", "HGB", "HCT", "PLT" in the last 72 hours.  No results for input(s): "NA", "K", "CL", "CO2", "GLUCOSE", "BUN", "CREATININE", "CALCIUM" in the last 72 hours.   Intake/Output Summary (Last 24 hours) at 10/11/2022 0911 Last data filed at 10/11/2022 1610 Gross per 24 hour  Intake 594 ml  Output --  Net 594 ml        Physical Exam: Vital Signs Blood pressure 124/70, pulse 76, temperature 97.8 F (36.6 C), resp. rate 18, height '5\' 4"'$  (1.626 m), weight 124.5 kg, last menstrual period 08/17/2012, SpO2 98 %.       General: awake, alert, appropriate, sitting up in w/c at bedside; nurse in room; NAD HENT: conjugate gaze; oropharynx moist- eyes moist due to eyedrops CV: regular rate; no JVD Pulmonary: CTA B/L; no W/R/R- good air movement GI: soft, NT, ND, (+)BS Psychiatric: appropriate- less anxious appearing.  Neurological: Ox3  MS: less TTP in R knee- effusion slightly better; B/ hand shaking- due to weakness sin hands/Ue's.  UE strength 4/5 except distal is 4-/5 LE's- HF 2/5; KE 3+/5; DF 3+/5 and PF 4-/5 B/L  Skin: multiple IV's and blood draw tape areas- Assessment/Plan: 1. Functional deficits which require 3+ hours per day of interdisciplinary therapy in a comprehensive inpatient rehab setting. Physiatrist is providing close team supervision and 24 hour management of active medical problems listed below. Physiatrist and rehab team continue to assess barriers to discharge/monitor patient progress toward functional and medical goals  Care Tool:  Bathing    Body parts  bathed by patient: Right arm, Left arm, Chest, Abdomen, Front perineal area, Right upper leg, Left upper leg, Face   Body parts bathed by helper: Buttocks, Right lower leg, Left lower leg     Bathing assist Assist Level: Moderate Assistance - Patient 50 - 74%     Upper Body Dressing/Undressing Upper body dressing   What is the patient wearing?: Pull over shirt    Upper body assist Assist Level: Moderate Assistance - Patient 50 - 74%    Lower Body Dressing/Undressing Lower body dressing      What is the patient wearing?: Pants, Underwear/pull up     Lower body assist Assist for lower body dressing: Total Assistance - Patient < 25%     Toileting Toileting    Toileting assist Assist for toileting: 2 Helpers     Transfers Chair/bed transfer  Transfers assist     Chair/bed transfer assist level: Dependent - mechanical lift     Locomotion Ambulation   Ambulation assist   Ambulation activity did not occur: Safety/medical concerns          Walk 10 feet activity   Assist  Walk 10 feet activity did not occur: Safety/medical concerns        Walk 50  feet activity   Assist Walk 50 feet with 2 turns activity did not occur: Safety/medical concerns         Walk 150 feet activity   Assist Walk 150 feet activity did not occur: Safety/medical concerns         Walk 10 feet on uneven surface  activity   Assist Walk 10 feet on uneven surfaces activity did not occur: Safety/medical concerns         Wheelchair     Assist Is the patient using a wheelchair?: Yes Type of Wheelchair: Manual    Wheelchair assist level: Minimal Assistance - Patient > 75% Max wheelchair distance: 50    Wheelchair 50 feet with 2 turns activity    Assist        Assist Level: Minimal Assistance - Patient > 75%   Wheelchair 150 feet activity     Assist      Assist Level: Maximal Assistance - Patient 25 - 49%   Blood pressure 124/70, pulse 76,  temperature 97.8 F (36.6 C), resp. rate 18, height '5\' 4"'$  (1.626 m), weight 124.5 kg, last menstrual period 08/17/2012, SpO2 98 %.  Medical Problem List and Plan: 1. 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   Con't CIR PT and OT 2.  Antithrombotics: -DVT/anticoagulation:  Pharmaceutical: Lovenox start 1/17             -antiplatelet therapy: none   3. 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 Mood/Behavior/Sleep: LCSW to evaluate and provide emotional support             -antipsychotic agents: n/a   1/24- anxiety is limiting therapy- will add Buspar and Celexa for anxiety  1/25- brighter affect this AM 5. Neuropsych/cognition: This patient is capable of making decisions on her own behalf.   6. Skin/Wound Care: Routine skin care checks   7. Fluids/Electrolytes/Nutrition: Routine Is and Os and follow-up chemistries             -protein supplements for hypoalbuminemia             -continue folic acid, V56             -continue MVI   1/18- will check with pharmacy if needs B12 shots AND daily pills.  8: Chronic back pain/radiculopathy:  -continue Dilaudid 4 mg q 6 hours - is her home medicine (PTA per Dr. [name])             -continue gabapentin 600 mg TID             -continue Lidoderm patch daily             -continue Flexeril 10 mg TID prn   1/26- will d/c Lidoderm- not using. Otherwise, pain controlled- con't regimen 9: Urinary retention:  Foley replaced on 1/15              -treated for UTI with Rocephin for  7 days   1/18- will start Flomax 0.4 mg qsupper and will try and remove Foley in AM- explained will need in/out caths likely before bladder muscle kicks in and she can void.   1/19- foley out this Am at 6am- hasn't voided yet- added bladder scans q6 hours and then cath of volumes /350cc- pt made aware can take a few days to kick in/the flomax. Has voided some and cathed x1-   1/20- no more caths require-d voiding on her own- con't flomax  1/22- d/c bladder scans since voiding well- only required 1 cath since foley removed fyi.   1/23- voiding well- d/c bladder scans 10: Hypertension: monitor TID and prn             -continue Lasix 40 mg daily             -continue Lopressor 50 mg BID             -add magnesium gluconate '250mg'$  HS   1/22- BP controlled- con't regimen 11: GERD?/GI prophylaxis: continue Protonix   12: B12 deficiency: continue supplementation   1/19- says itches with B12 shots, but just takes benadryl 13: Class 3 obesity: BMI = 48; weight loss counseling   1/24- BMI down to 46.62 14: Hyponatremia, mild: follow-up BMP   15: Elevated serum creatinine; 0.75 1/16 to 1.06 on 1/17             -follow-up BMP   1/18- stable- con't to monitor weekly and prn 16: GBS: continue to monitor NIFs/VIC q 12 hours             -continue gabapentin   1/19- will increase gabapaentin to 900 mg TID  1/23- slightly better- con't regimen 17: Glaucoma: continue latanoprost eye drops   18: Tobacco use: cessation counseling   19. Constipation  1/18- will con't Senna per pt request- to not increase- but if no BM by tomorrow, will try Sorbitol.   1/23- LBM this AM 20.. insomnia  1/20- will start Melatonin 5 mg QHS prn- don't want trazodone since can cause urinary retention  1/22- sleeping a little better 21. Anxiety  1/24- added Buspar 5 mg TID- will also add Celexa for mood/anxiety prevention 20 mg daily. 1/25- no side effects so far.    1/26- feels anxiety  is do ing MUCH better per pt.   I spent a total of  36  minutes on total care today- >50% coordination of care- due to  D/w OT about weighted utensils or any other options   LOS: 9 days A FACE TO FACE EVALUATION WAS PERFORMED  Dana [name] 10/11/2022, 9:11 AM

## 2022-10-11 NOTE — Progress Notes (Signed)
Occupational Therapy Session Note  Patient Details  Name: Dana [name] MRN: [id number] Date of Birth: [birth year]  Today's Date: 10/11/2022 OT Individual Time: 1451-1545 OT Individual Time Calculation (min): 54 min    Short Term Goals: Week 1:  OT Short Term Goal 1 (Week 1): Pt will power up to stand at grab bar or RW with mod A during LB dressing OT Short Term Goal 2 (Week 1): Pt will complete grooming and UB self care routine seated sink side with set up OT Short Term Goal 3 (Week 1): Pt will don LB garments over feet with AE with min a     Skilled Therapeutic Interventions/Progress Updates:     Pt received semi-reclined in bed presenting to be in good spirits and motivated to participate in skilled OT session. Pt requesting to use restroom at beginning of session.   Pt transitioned to EOB with mod A to bring L leg off bed and lift trunk with education provided on modified log rolling technique to increase independence in bed mobility. Pt able to pull self to standing using STEDY with knees stabilized for transport to bathroom. Pt provided increased time on toilet for BM (continent void and BM). Pt max A for posterior/anterior peri-care and LB clothing management. Pt able to initiate pulling underwear/pants to waist requiring assist to finish task. Pt transported to sink in steady washing hands with set-up assist. Pt noted to drop items x2 during handwashing.   Pt transported to EOB for dynamic sitting balance and FM activity. Pt able to maintain sitting balance ~2 minutes close supervision with bilateral feet support requiring rest break following d/t feeling of falling off EOB. Pt able to pull self to standing in steady for transport to recliner. Pt expressed she would like to try wearing gloves for fine motor activities to increase FM skills. OT receptive of trying during session. Pt educated on modified techniques for donning gloves with Pt and OT collaborating for problem solving and  identifying solutions to increase Pt independence. Pt able to donn R glove with +time requiring min A ++time for L glove. Pt then removed bead from putty while wearing gloves reporting mild improvement in deep pressure sensation while wearing gloves.   Pt requested to return to bed at end of session. Pt able to pull self to standing in STEDY for transport to bed. Pt educated on reverse log rolling technique with Pt reporting she preferred to lay back in bed vs. lower trunk onto side to return to bed. Pt returned to bed lower trunk flat towards back of bed requiring max A to lift legs into bed with Pt finishing transfer in diagonal position. Pt able to scoot shoulders toward middle of bed using bed features +time. Increased time required for returning Pt to bed with Pt pulling self up in bed with bed in trendelenburg position. Pt was left resting in bed with call bell in reach, bed alarm on, and all needs met.    Therapy Documentation Precautions:  Precautions Precautions: Fall Precaution Comments: weakness, numbness L anterior thigh. Restrictions Weight Bearing Restrictions: No General:   Vital Signs:   Pain:   ADL: ADL Equipment Provided: Long-handled sponge Eating: Set up Where Assessed-Eating: Wheelchair Grooming: Minimal assistance Where Assessed-Grooming: Wheelchair, Sitting at sink Upper Body Bathing: Moderate assistance Where Assessed-Upper Body Bathing: Sitting at sink, Wheelchair Lower Body Bathing: Dependent Where Assessed-Lower Body Bathing: Sitting at sink Upper Body Dressing: Minimal assistance Where Assessed-Upper Body Dressing: Sitting at sink, Wheelchair Lower Body  Dressing: Dependent Where Assessed-Lower Body Dressing: Wheelchair Toileting: Dependent Where Assessed-Toileting: Toilet, Bedside Commode Toilet Transfer: Dependent Toilet Transfer Method: Other (comment) (STEDY lift x 2) Tub/Shower Transfer: Unable to assess Social research officer, government: Dependent Press photographer Method: Other (comment) (Stedy lift x 2) ADL Comments: Pt with severe LB weakness >UB weakness. Use of STEDY OOB to 3 in 1 over toilet then sink side bathing in STEDY. Has Foley cath for voiding and max A for peri and buttocks hygeine as unable to stand or manage care seated due to weakness and body habitus    Therapy/Group: Individual Therapy  Janey Genta 10/11/2022, 3:10 PM

## 2022-10-11 NOTE — Progress Notes (Signed)
Physical Therapy Session Note  Patient Details  Name: Dana [name] MRN: [id number] Date of Birth: December 10, 1965  Today's Date: 10/11/2022 PT Individual Time: 0800-0900 PT Individual Time Calculation (min): 60 min   Short Term Goals: Week 1:  PT Short Term Goal 1 (Week 1): Pt will roll side to side w/ CGA. PT Short Term Goal 2 (Week 1): Pt will transfer sup <> sit w/ min A logroll technique. PT Short Term Goal 3 (Week 1): Pt will transfer sit to stand w/ min A PT Short Term Goal 4 (Week 1): Pt will stand x 5' w/ LRAD (Stedy vs RW). PT Short Term Goal 5 (Week 1): PT to assess gait.  Skilled Therapeutic Interventions/Progress Updates: Pt presents sitting in w/c and eating breakfast.  Pt agreeable to therapy, will finish eating fruit after therapy session.  Pt wheeled to dayroom for time conservation.  Pt performed Kinetron at 70 cm/sec x 2".  Next 4 trials performed w/ cues for complete ROM all way to floor, and increased bilateral involvement by maintaining flat foot contralateral foot as well as letting up on contralateral foot when pressing down w/ other.  Pt transferred sit to stand multiple trials w/ Max A and verbal cues for sequencing, especially scooting forward and foot placement.  Pt stood x 2-3' w/ weight shifting, and then heel-off alternating fett.  Pt unable to clear foot from floor forward but able to step back to w/c.  Pt able to scoot back into w/c w/ cues and supervision.  Pt returned to room and finishing breakfast w/ all needs in reach.  Pt required multiple rest breaks 2/2 fatigue.      Therapy Documentation Precautions:  Precautions Precautions: Fall Precaution Comments: weakness, numbness L anterior thigh. Restrictions Weight Bearing Restrictions: No General:   Vital Signs: Therapy Vitals Temp: 97.8 F (36.6 C) Pulse Rate: 76 Resp: 18 BP: 124/70 Patient Position (if appropriate): Lying Oxygen Therapy SpO2: 98 % O2 Device: Room Air Pain:0/10 Pain  Assessment Pain Scale: 0-10 Pain Score: 4  Pain Type: Acute pain Pain Location: Generalized Pain Orientation: Left Pain Descriptors / Indicators: Aching Pain Frequency: Intermittent Pain Onset: On-going Patients Stated Pain Goal: 3 Pain Intervention(s): Medication (See eMAR) Multiple Pain Sites: No    Therapy/Group: Individual Therapy  [name] 10/11/2022, 9:01 AM

## 2022-10-12 DIAGNOSIS — I10 Essential (primary) hypertension: Secondary | ICD-10-CM

## 2022-10-12 DIAGNOSIS — K5901 Slow transit constipation: Secondary | ICD-10-CM

## 2022-10-12 NOTE — Progress Notes (Signed)
NIF >40 VC 1.9L  Pt had great effort!

## 2022-10-12 NOTE — Progress Notes (Signed)
PROGRESS NOTE   Subjective/Complaints:  Pt slept well. No new complaints. Denies pain  ROS: Patient denies fever, rash, sore throat, blurred vision, dizziness, nausea, vomiting, diarrhea, cough, shortness of breath or chest pain, joint or back/neck pain, headache, or mood change.   Objective:   No results found. No results for input(s): "WBC", "HGB", "HCT", "PLT" in the last 72 hours.  No results for input(s): "NA", "K", "CL", "CO2", "GLUCOSE", "BUN", "CREATININE", "CALCIUM" in the last 72 hours.   Intake/Output Summary (Last 24 hours) at 10/12/2022 1015 Last data filed at 10/12/2022 0918 Gross per 24 hour  Intake 716 ml  Output 0 ml  Net 716 ml        Physical Exam: Vital Signs Blood pressure 108/74, pulse 75, temperature (!) 97.4 F (36.3 C), resp. rate 17, height '5\' 4"'$  (1.626 m), weight 124.6 kg, last menstrual period 08/17/2012, SpO2 93 %.       Constitutional: No distress . Vital signs reviewed. obese HEENT: NCAT, EOMI, oral membranes moist Neck: supple Cardiovascular: RRR without murmur. No JVD    Respiratory/Chest: CTA Bilaterally without wheezes or rales. Normal effort    GI/Abdomen: BS +, non-tender, non-distended Ext: no clubbing, cyanosis, or edema Psych: pleasant and cooperative  MS: less TTP in R knee- effusion slightly better; B/ hand shaking- due to weakness sin hands/Ue's.  Neuro: Alert and oriented x 3. Normal insight and awareness. Intact Memory. Normal language and speech. Cranial nerve exam unremarkable  UE strength 4/5 except distal is 4-/5 LE's- HF 2/5; KE 3+/5; DF 3+/5 and PF 4-/5 B/L  Skin: multiple IV's and blood draw tape areas-   Assessment/Plan: 1. Functional deficits which require 3+ hours per day of interdisciplinary therapy in a comprehensive inpatient rehab setting. Physiatrist is providing close team supervision and 24 hour management of active medical problems listed  below. Physiatrist and rehab team continue to assess barriers to discharge/monitor patient progress toward functional and medical goals  Care Tool:  Bathing    Body parts bathed by patient: Right arm, Left arm, Chest, Abdomen, Front perineal area, Right upper leg, Left upper leg, Face   Body parts bathed by helper: Buttocks, Right lower leg, Left lower leg     Bathing assist Assist Level: Moderate Assistance - Patient 50 - 74%     Upper Body Dressing/Undressing Upper body dressing   What is the patient wearing?: Pull over shirt    Upper body assist Assist Level: Moderate Assistance - Patient 50 - 74%    Lower Body Dressing/Undressing Lower body dressing      What is the patient wearing?: Pants, Underwear/pull up     Lower body assist Assist for lower body dressing: Total Assistance - Patient < 25%     Toileting Toileting    Toileting assist Assist for toileting: 2 Helpers     Transfers Chair/bed transfer  Transfers assist     Chair/bed transfer assist level: Dependent - mechanical lift     Locomotion Ambulation   Ambulation assist   Ambulation activity did not occur: Safety/medical concerns          Walk 10 feet activity   Assist  Walk 10 feet activity  did not occur: Safety/medical concerns        Walk 50 feet activity   Assist Walk 50 feet with 2 turns activity did not occur: Safety/medical concerns         Walk 150 feet activity   Assist Walk 150 feet activity did not occur: Safety/medical concerns         Walk 10 feet on uneven surface  activity   Assist Walk 10 feet on uneven surfaces activity did not occur: Safety/medical concerns         Wheelchair     Assist Is the patient using a wheelchair?: Yes Type of Wheelchair: Manual    Wheelchair assist level: Minimal Assistance - Patient > 75% Max wheelchair distance: 50    Wheelchair 50 feet with 2 turns activity    Assist        Assist Level:  Minimal Assistance - Patient > 75%   Wheelchair 150 feet activity     Assist      Assist Level: Maximal Assistance - Patient 25 - 49%   Blood pressure 108/74, pulse 75, temperature (!) 97.4 F (36.3 C), resp. rate 17, height '5\' 4"'$  (1.626 m), weight 124.6 kg, last menstrual period 08/17/2012, SpO2 93 %.  Medical Problem List and Plan: 1. 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   -Continue CIR therapies including PT, OT  2.  Antithrombotics: -DVT/anticoagulation:  Pharmaceutical: Lovenox start 1/17             -antiplatelet therapy: none   3. 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 Mood/Behavior/Sleep: LCSW to evaluate and provide emotional support             -antipsychotic agents: n/a   1/24- anxiety is limiting therapy- will add Buspar and Celexa for anxiety  1/27 bright affect 5. Neuropsych/cognition: This patient is capable of making decisions on her own behalf.   6. Skin/Wound Care: Routine skin care checks   7. Fluids/Electrolytes/Nutrition: Routine Is and Os and follow-up chemistries             -protein supplements for hypoalbuminemia             -continue folic acid, W54             -continue MVI   1/18- will check with pharmacy if needs B12 shots AND daily pills.  8: Chronic back pain/radiculopathy:  -continue Dilaudid 4 mg q 6 hours - is her home medicine (PTA per Dr. [name])             -continue gabapentin 600 mg TID             -continue Lidoderm patch  daily             -continue Flexeril 10 mg TID prn  1/26- will d/c Lidoderm- not using. Otherwise, pain controlled- con't regimen 9: Urinary retention: Foley replaced on 1/15             -treated for UTI with Rocephin for  7 days   1/18- will start Flomax 0.4 mg qsupper and will try and remove Foley in AM- explained will need in/out caths likely before bladder muscle kicks in and she can void.   1/19- foley out this Am at 6am- hasn't voided yet- added bladder scans q6 hours and then cath of volumes /350cc- pt made aware can take a few days to kick in/the flomax. Has voided some and cathed x1-   1/20- no more caths require-d voiding on her own- con't flomax  1/22- d/c bladder scans since voiding well- only required 1 cath since foley removed fyi.   1/23- voiding well- d/c bladder scans 10: Hypertension: monitor TID and prn             -continue Lasix 40 mg daily             -continue Lopressor 50 mg BID             -add magnesium gluconate '250mg'$  HS   1/22- BP controlled- con't regimen 11: GERD?/GI prophylaxis: continue Protonix   12: B12 deficiency: continue supplementation   1/19- says itches with B12 shots, but just takes benadryl 13: Class 3 obesity: BMI = 48; weight loss counseling   1/24- BMI down to 46.62 14: Hyponatremia, mild: follow-up BMP   15: Elevated serum creatinine; 0.75 1/16 to 1.06 on 1/17             -follow-up BMP   1/18- stable- con't to monitor weekly and prn 16: GBS: continue to monitor NIFs/VIC q 12 hours             -continue gabapentin   1/19- will increase gabapaentin to 900 mg TID  1/23- slightly better- con't regimen 17: Glaucoma: continue latanoprost eye drops   18: Tobacco use: cessation counseling   19. Constipation  1/18- will con't Senna per pt request- to not increase- but if no BM by tomorrow, will try Sorbitol.   1/27- LBM this 1/25 20.. insomnia  1/20- will start Melatonin 5 mg QHS prn- don't want trazodone since can cause urinary  retention  1/22- sleeping a little better 21. Anxiety  1/24- added Buspar 5 mg TID- will also add Celexa for mood/anxiety prevention 20 mg daily. 1/25- no side effects so far.    1/26-27- feels anxiety is do ing MUCH better per pt.       LOS: 10 days A FACE TO FACE EVALUATION WAS PERFORMED  Meredith Staggers 10/12/2022, 10:15 AM

## 2022-10-13 NOTE — Progress Notes (Signed)
Occupational Therapy Weekly Progress Note  Patient Details  Name: Dana [name] MRN: [id number] Date of Birth: Feb 19, 1966  Beginning of progress report period: October 03, 2022 End of progress report period: October 12, 2022  Today's Date: 10/13/2022 OT Individual Time: 1017-5102 OT Individual Time Calculation (min): 55 min    Patient has met 2 of 3 short term goals. Pt making steady progress towards LTGs this session, progressing towards trialing lateral transfers using a transfer board with overall Mod A. Pt currently requires setup A for UB ADLs and closer to Max A LB dressing. Pt using foam build-up and non-spill cup for feeding. Family education to be planned closer to discharge date.   Patient continues to demonstrate the following deficits: muscle weakness, decreased cardiorespiratoy endurance, decreased coordination, and decreased sensation in bilateral hands and therefore will continue to benefit from skilled OT intervention to enhance overall performance with BADL and Reduce care partner burden.  Patient progressing toward long term goals..  Continue plan of care.  OT Short Term Goals Week 2:  OT Short Term Goal 1 (Week 2): STGs=LTGs due to patient's length of stay.  Skilled Therapeutic Interventions/Progress Updates:  Pt received resting in bed for skilled OT session with focus on BADL retraining. Pt agreeable to interventions, demonstrating overall pleasant mood. Pt with un-rated pain, stating "my arthritis is kicking my butt today" in reference to generalized knee pain. OT offering intermediate rest breaks and positioning suggestions throughout session to address pain/fatigue and maximize participation/safety in session.  Supine in bed, pt rolls R/L to assist with underwear management, pt requiring Max A to doff/don new garment. Pt able to perform anterior pericare with setup, requiring Max A for posterior pericare.   Pt comes to EOB with HOB elevated and increased time.  Seated EOB, pt requires A for threading of sweat-pants, but donning UB garments with item retrieval. Pt comes into standing using stedy + Min A, dependent for donning of sweat-pants over bottom/hips. Pt dependent for sock change.   Seated in WC at sink-side, pt completes oral care and face washing with supervision.    Pt using stedy for WC>recliner with supervision for STS on/off of stedy.  Pt remained seated in recliner with all immediate needs met at end of session. Pt continues to be appropriate for skilled OT intervention to promote further functional independence.   Therapy Documentation Precautions:  Precautions Precautions: Fall Precaution Comments: weakness, numbness L anterior thigh. Restrictions Weight Bearing Restrictions: No   Therapy/Group: Individual Therapy  Maudie Mercury, OTR/L, MSOT  10/13/2022, 7:53 AM

## 2022-10-13 NOTE — Progress Notes (Signed)
Physical Therapy Weekly Progress Note  Patient Details  Name: Dana [name] MRN: [id number] Date of Birth: [birth year]  Beginning of progress report period: October 03, 2022 End of progress report period: October 13, 2022  Today's Date: 10/13/2022 PT Individual Time: 1100-1200 PT Individual Time Calculation (min): 60 min   Patient has met 4 of 5 short term goals.  Patient is making limited progress to long term goals due to impaired sensation, strength deficits and anxiety. Pt with fear of falling that limits pt's participation in mobility training. Pt largely requires CGA/min A for bed mobility and STS with HDRW. Pt requires Stedy for bed to chair transfers. Plan to have w/c evaluation on Monday 1/28 with Deatra Ina from [facility]. Plan to continue pt and family education and continue bed to chair transfer training. Pt lowered to the floor following left knee buckle in stand pivot transfer attempt with HDRW.   Patient continues to demonstrate the following deficits muscle weakness, ataxia, decreased coordination, and decreased motor planning, and decreased standing balance, decreased postural control, and decreased balance strategies and therefore will continue to benefit from skilled PT intervention to increase functional independence with mobility.  Patient progressing toward long term goals..  Continue plan of care.  PT Short Term Goals Week 1:  PT Short Term Goal 1 (Week 1): Pt will roll side to side w/ CGA. PT Short Term Goal 1 - Progress (Week 1): Met PT Short Term Goal 2 (Week 1): Pt will transfer sup <> sit w/ min A logroll technique. PT Short Term Goal 2 - Progress (Week 1): Met PT Short Term Goal 3 (Week 1): Pt will transfer sit to stand w/ min A PT Short Term Goal 3 - Progress (Week 1): Met PT Short Term Goal 4 (Week 1): Pt will stand x 5' w/ LRAD (Stedy vs RW). PT Short Term Goal 4 - Progress (Week 1): Met PT Short Term Goal 5 (Week 1): PT to assess gait. PT Short  Term Goal 5 - Progress (Week 1): Not progressing Week 2:  PT Short Term Goal 1 (Week 2): Pt will require CGA for STS with LRAD PT Short Term Goal 2 (Week 2): Pt will require CGA for standing balance with LRAD PT Short Term Goal 3 (Week 2): Pt will require max A for bed to chair transfer with LRAD  Skilled Therapeutic Interventions/Progress Updates:      Therapy Documentation Precautions:  Precautions Precautions: Fall Precaution Comments: weakness, numbness L anterior thigh. Restrictions Weight Bearing Restrictions: No  Daily Treatment Session:   Pt received seated in recliner at bedside agreeable to PT session with emphasis on transfer training with additional assist available throughout entirety of session.   Pt supervision with sit to stand from recliner with Stedy and transferred to manual w/c. Pt transported total A for time management and energy conservation to dayroom.   Pt requires CGA with STS transfer with HDRW. Pt able to tolerate standing ~2 minutes with CGA. Emphasis on lateral weight shifting in standing with heel raises to prepare for stand pivot transfers and gait.   Pt ambulated ~3 feet with HDRW with CGA and close w/c follow with verbal cues for step length.   PT demonstrated SPT with HDRW and pt agreeable to trial.   Pt required CGA with STS and presented with left knee buckle with initiation of stand pivot transfer. PT and additional assist lowered pt to floor for safety. Following incident pt reports increase in baseline right knee pain to  7-8/10 from 0/10. Pt dependently transferred via maxi move from floor to w/c with additional assist and vitals assessed :   BP: 120/71 (87)  O2: 98%   Pulse: 63   Pt transported total A for time management and dependently transferred via maxi move to recliner.   Pt left seated in recliner with all needs in reach, cryotherapy to right knee, and chair alarm on.   MD and nursing updated regarding session.   Therapy/Group:  Individual Therapy  Verl Dicker Verl Dicker PT, DPT  10/13/2022, 7:52 AM

## 2022-10-13 NOTE — Progress Notes (Signed)
NIF >-40, VC: 1.7L performed with good effort.

## 2022-10-13 NOTE — Progress Notes (Signed)
Pt completed NIF/VC less than 12 hours ago; will follow if pt is awake

## 2022-10-13 NOTE — Progress Notes (Signed)
PROGRESS NOTE   Subjective/Complaints:  Had a pretty good night. Pain controlled. Frustrated by ongoing sensory loss in limbs which is affecting her functional activities,mobility.   ROS: Patient denies fever, rash, sore throat, blurred vision, dizziness, nausea, vomiting, diarrhea, cough, shortness of breath or chest pain, joint or back/neck pain, headache, or mood change.   Objective:   No results found. No results for input(s): "WBC", "HGB", "HCT", "PLT" in the last 72 hours.  No results for input(s): "NA", "K", "CL", "CO2", "GLUCOSE", "BUN", "CREATININE", "CALCIUM" in the last 72 hours.   Intake/Output Summary (Last 24 hours) at 10/13/2022 1093 Last data filed at 10/13/2022 0723 Gross per 24 hour  Intake 456 ml  Output --  Net 456 ml        Physical Exam: Vital Signs Blood pressure 116/61, pulse 74, temperature 98.8 F (37.1 C), temperature source Oral, resp. rate 18, height '5\' 4"'$  (1.626 m), weight 124.6 kg, last menstrual period 08/17/2012, SpO2 97 %.       Constitutional: No distress . Vital signs reviewed. obese HEENT: NCAT, EOMI, oral membranes moist Neck: supple Cardiovascular: RRR without murmur. No JVD    Respiratory/Chest: CTA Bilaterally without wheezes or rales. Normal effort    GI/Abdomen: BS +, non-tender, non-distended Ext: no clubbing, cyanosis, or edema Psych: pleasant and cooperative  MS: less TTP in R knee- effusion slightly better; B/ hand shaking- due to weakness sin hands/Ue's.  Neuro: Alert and oriented x 3. Normal insight and awareness. Intact Memory. Normal language and speech. Cranial nerve exam unremarkable. Stocking glove sensory loss BLE L>>R. BUE both hands/wrists. UE strength 4/5 except distal is 4-/5 LE's- HF 2+/5; KE 4-/5; DF 3+ to 4-/5 and PF 4- to 4/5 B/L  Skin: multiple IV's and blood draw tape areas-   Assessment/Plan: 1. Functional deficits which require 3+ hours per day  of interdisciplinary therapy in a comprehensive inpatient rehab setting. Physiatrist is providing close team supervision and 24 hour management of active medical problems listed below. Physiatrist and rehab team continue to assess barriers to discharge/monitor patient progress toward functional and medical goals  Care Tool:  Bathing    Body parts bathed by patient: Right arm, Left arm, Chest, Abdomen, Front perineal area, Right upper leg, Left upper leg, Face   Body parts bathed by helper: Buttocks, Right lower leg, Left lower leg     Bathing assist Assist Level: Moderate Assistance - Patient 50 - 74%     Upper Body Dressing/Undressing Upper body dressing   What is the patient wearing?: Pull over shirt    Upper body assist Assist Level: Moderate Assistance - Patient 50 - 74%    Lower Body Dressing/Undressing Lower body dressing      What is the patient wearing?: Pants, Underwear/pull up     Lower body assist Assist for lower body dressing: Total Assistance - Patient < 25%     Toileting Toileting    Toileting assist Assist for toileting: 2 Helpers     Transfers Chair/bed transfer  Transfers assist     Chair/bed transfer assist level: Dependent - mechanical lift     Locomotion Ambulation   Ambulation assist   Ambulation activity  did not occur: Safety/medical concerns          Walk 10 feet activity   Assist  Walk 10 feet activity did not occur: Safety/medical concerns        Walk 50 feet activity   Assist Walk 50 feet with 2 turns activity did not occur: Safety/medical concerns         Walk 150 feet activity   Assist Walk 150 feet activity did not occur: Safety/medical concerns         Walk 10 feet on uneven surface  activity   Assist Walk 10 feet on uneven surfaces activity did not occur: Safety/medical concerns         Wheelchair     Assist Is the patient using a wheelchair?: Yes Type of Wheelchair: Manual     Wheelchair assist level: Minimal Assistance - Patient > 75% Max wheelchair distance: 50    Wheelchair 50 feet with 2 turns activity    Assist        Assist Level: Minimal Assistance - Patient > 75%   Wheelchair 150 feet activity     Assist      Assist Level: Maximal Assistance - Patient 25 - 49%   Blood pressure 116/61, pulse 74, temperature 98.8 F (37.1 C), temperature source Oral, resp. rate 18, height '5\' 4"'$  (1.626 m), weight 124.6 kg, last menstrual period 08/17/2012, SpO2 97 %.  Medical Problem List and Plan: 1. 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   -Continue CIR therapies including PT, OT  -discussed time course of recovery in GBS. Tried to reassure pt that she will see further improvement in sensory function.  2.  Antithrombotics: -DVT/anticoagulation:  Pharmaceutical: Lovenox start 1/17             -antiplatelet therapy: none   3. 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 Mood/Behavior/Sleep: LCSW to evaluate and provide emotional support             -antipsychotic agents: n/a   1/24- anxiety is limiting therapy- will add Buspar and Celexa for anxiety  1/28 a little frustrated by ongoing deficits 5. Neuropsych/cognition: This patient is capable of making decisions on her own behalf.   6. Skin/Wound Care: Routine skin care checks   7. Fluids/Electrolytes/Nutrition: Routine Is and Os and follow-up chemistries             -protein supplements for  hypoalbuminemia             -continue folic acid, H47             -continue MVI   1/18- will check with pharmacy if needs B12 shots AND daily pills.  8: Chronic back pain/radiculopathy:  -continue Dilaudid 4 mg q 6 hours - is her home medicine (PTA per Dr. Annie Main  Sarajane Jews)             -continue gabapentin 600 mg TID             -continue Lidoderm patch daily             -continue Flexeril 10 mg TID prn   1/26- will d/c Lidoderm- not using. Otherwise, pain controlled- con't regimen 9: Urinary retention: Foley replaced on 1/15             -treated for UTI with Rocephin for  7 days   1/18- will start Flomax 0.4 mg qsupper and will try and remove Foley in AM- explained will need in/out caths likely before bladder muscle kicks in and she can void.   1/19- foley out this Am at 6am- hasn't voided yet- added bladder scans q6 hours and then cath of volumes /350cc- pt made aware can take a few days to kick in/the flomax. Has voided some and cathed x1-   1/20- no more caths require-d voiding on her own- con't flomax  1/22- d/c bladder scans since voiding well- only required 1 cath since foley removed fyi.   1/23- voiding well- d/c bladder scans 10: Hypertension: monitor TID and prn             -continue Lasix 40 mg daily             -continue Lopressor 50 mg BID             -add magnesium gluconate '250mg'$  HS   1/22- BP controlled- con't regimen 11: GERD?/GI prophylaxis: continue Protonix   12: B12 deficiency: continue supplementation   1/19- says itches with B12 shots, but just takes benadryl 13: Class 3 obesity: BMI = 48; weight loss counseling   1/24- BMI down to 46.62 14: Hyponatremia, mild: follow-up BMP   15: Elevated serum creatinine; 0.75 1/16 to 1.06 on 1/17             -follow-up BMP   1/18- stable- con't to monitor weekly and prn 16: GBS: continue to monitor NIFs/VIC q 12 hours             -continue gabapentin   1/19- will increase gabapaentin to 900 mg TID  1/23- slightly better- con't  regimen 17: Glaucoma: continue latanoprost eye drops   18: Tobacco use: cessation counseling   19. Constipation  1/18- will con't Senna per pt request- to not increase- but if no BM by tomorrow, will try Sorbitol.   1/27- LBM this 1/25 20.. insomnia  1/20- will start Melatonin 5 mg QHS prn- don't want trazodone since can cause urinary retention  1/22- sleeping a little better 21. Anxiety  1/24- added Buspar 5 mg TID- will also add Celexa for mood/anxiety prevention 20 mg daily. 1/25- no side effects so far.    1/26-27- feels anxiety is do ing MUCH better per pt.       LOS: 11 days A FACE TO FACE EVALUATION WAS PERFORMED  Meredith Staggers 10/13/2022, 9:22 AM

## 2022-10-14 LAB — BASIC METABOLIC PANEL
Anion gap: 11 (ref 5–15)
BUN: 22 mg/dL — ABNORMAL HIGH (ref 6–20)
CO2: 29 mmol/L (ref 22–32)
Calcium: 8.8 mg/dL — ABNORMAL LOW (ref 8.9–10.3)
Chloride: 97 mmol/L — ABNORMAL LOW (ref 98–111)
Creatinine, Ser: 1.16 mg/dL — ABNORMAL HIGH (ref 0.44–1.00)
GFR, Estimated: 55 mL/min — ABNORMAL LOW (ref >60)
Glucose, Bld: 91 mg/dL (ref 70–99)
Potassium: 4.1 mmol/L (ref 3.5–5.1)
Sodium: 137 mmol/L (ref 135–145)

## 2022-10-14 LAB — CBC
HCT: 33.1 % — ABNORMAL LOW (ref 36.0–46.0)
Hemoglobin: 10.5 g/dL — ABNORMAL LOW (ref 12.0–15.0)
MCH: 29.8 pg (ref 26.0–34.0)
MCHC: 31.7 g/dL (ref 30.0–36.0)
MCV: 94 fL (ref 80.0–100.0)
Platelets: 316 10*3/uL (ref 150–400)
RBC: 3.52 MIL/uL — ABNORMAL LOW (ref 3.87–5.11)
RDW: 15.2 % (ref 11.5–15.5)
WBC: 8.3 10*3/uL (ref 4.0–10.5)
nRBC: 0 % (ref 0.0–0.2)

## 2022-10-14 MED ORDER — FUROSEMIDE 40 MG PO TABS
40.0000 mg | ORAL_TABLET | Freq: Every day | ORAL | Status: DC
  Administered 2022-10-15 – 2022-10-22 (×8): 40 mg via ORAL
  Filled 2022-10-14 (×8): qty 1

## 2022-10-14 NOTE — Progress Notes (Addendum)
PROGRESS NOTE   Subjective/Complaints:  Pt asking if going home on meds that she's on.   R knee/leg buckled yesterday- R knee hurting, but getting better.   ROS:  Pt denies SOB, abd pain, CP, N/V/C/D, and vision changes  Objective:   No results found. Recent Labs    10/14/22 0555  WBC 8.3  HGB 10.5*  HCT 33.1*  PLT 316    Recent Labs    10/14/22 0555  NA 137  K 4.1  CL 97*  CO2 29  GLUCOSE 91  BUN 22*  CREATININE 1.16*  CALCIUM 8.8*     Intake/Output Summary (Last 24 hours) at 10/14/2022 0852 Last data filed at 10/14/2022 0719 Gross per 24 hour  Intake 633 ml  Output --  Net 633 ml        Physical Exam: Vital Signs Blood pressure (!) 109/94, pulse 68, temperature 98.3 F (36.8 C), temperature source Oral, resp. rate 18, height '5\' 4"'$  (1.626 m), weight 124.6 kg, last menstrual period 08/17/2012, SpO2 97 %.        General: awake, alert, appropriate, sitting up in bed; BMI 47.15NAD HENT: conjugate gaze; oropharynx moist CV: regular rate; no JVD Pulmonary: CTA B/L; no W/R/R- good air movement GI: soft, NT, ND, (+)BS- protuberant Psychiatric: appropriate- less anxious Neurological: Ox3 Sensory deficits esp in Ue's  Ext: no clubbing, cyanosis, or edema Psych: pleasant and cooperative  MS: less TTP in R knee- effusion slightly better; B/ hand shaking- due to weakness sin hands/Ue's.  Neuro: Alert and oriented x 3. Normal insight and awareness. Intact Memory. Normal language and speech. Cranial nerve exam unremarkable. Stocking glove sensory loss BLE L>>R. BUE both hands/wrists. UE strength 4/5 except distal is 4-/5 LE's- HF 2+/5; KE 4-/5; DF 3+ to 4-/5 and PF 4- to 4/5 B/L  Skin: multiple IV's and blood draw tape areas-   Assessment/Plan: 1. Functional deficits which require 3+ hours per day of interdisciplinary therapy in a comprehensive inpatient rehab setting. Physiatrist is providing close  team supervision and 24 hour management of active medical problems listed below. Physiatrist and rehab team continue to assess barriers to discharge/monitor patient progress toward functional and medical goals  Care Tool:  Bathing    Body parts bathed by patient: Right arm, Left arm, Chest, Abdomen, Front perineal area, Right upper leg, Left upper leg, Face   Body parts bathed by helper: Buttocks, Right lower leg, Left lower leg     Bathing assist Assist Level: Moderate Assistance - Patient 50 - 74%     Upper Body Dressing/Undressing Upper body dressing   What is the patient wearing?: Pull over shirt    Upper body assist Assist Level: Set up assist    Lower Body Dressing/Undressing Lower body dressing      What is the patient wearing?: Underwear/pull up, Pants     Lower body assist Assist for lower body dressing: Maximal Assistance - Patient 25 - 49%     Toileting Toileting    Toileting assist Assist for toileting: 2 Helpers     Transfers Chair/bed transfer  Transfers assist     Chair/bed transfer assist level: Dependent - mechanical lift  Locomotion Ambulation   Ambulation assist   Ambulation activity did not occur: Safety/medical concerns          Walk 10 feet activity   Assist  Walk 10 feet activity did not occur: Safety/medical concerns        Walk 50 feet activity   Assist Walk 50 feet with 2 turns activity did not occur: Safety/medical concerns         Walk 150 feet activity   Assist Walk 150 feet activity did not occur: Safety/medical concerns         Walk 10 feet on uneven surface  activity   Assist Walk 10 feet on uneven surfaces activity did not occur: Safety/medical concerns         Wheelchair     Assist Is the patient using a wheelchair?: Yes Type of Wheelchair: Manual    Wheelchair assist level: Minimal Assistance - Patient > 75% Max wheelchair distance: 50    Wheelchair 50 feet with 2 turns  activity    Assist        Assist Level: Minimal Assistance - Patient > 75%   Wheelchair 150 feet activity     Assist      Assist Level: Maximal Assistance - Patient 25 - 49%   Blood pressure (!) 109/94, pulse 68, temperature 98.3 F (36.8 C), temperature source Oral, resp. rate 18, height '5\' 4"'$  (1.626 m), weight 124.6 kg, last menstrual period 08/17/2012, SpO2 97 %.  Medical Problem List and Plan: 1. 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  -discussed time course of recovery in GBS. Tried to reassure pt that she will see further improvement in sensory function.  1/29- Con't CIR- PT and OT- and braces 2.  Antithrombotics: -DVT/anticoagulation:  Pharmaceutical: Lovenox start 1/17             -antiplatelet therapy: none   3. 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 Mood/Behavior/Sleep: LCSW to evaluate and provide emotional support             -antipsychotic agents: n/a   1/24- anxiety is limiting therapy- will add Buspar and Celexa for anxiety  1/28 a little frustrated by ongoing deficits 5. Neuropsych/cognition: This patient is capable of making decisions on her own behalf.   6. Skin/Wound Care: Routine skin care checks   7. Fluids/Electrolytes/Nutrition: Routine Is and Os and follow-up chemistries             -protein supplements for hypoalbuminemia             -continue folic acid, V74             -continue MVI   1/18- will check with pharmacy if needs B12 shots  AND daily pills.  8: Chronic back pain/radiculopathy:  -continue Dilaudid 4 mg q  6 hours - is her home medicine (PTA per Dr. [name])             -continue gabapentin 600 mg TID             -continue Lidoderm patch daily             -continue Flexeril 10 mg TID prn   1/26- will d/c Lidoderm- not using.  Otherwise, pain controlled- con't regimen 9: Urinary retention: Foley replaced on 1/15             -treated for UTI with Rocephin for  7 days   1/18- will start Flomax 0.4 mg qsupper and will try and remove Foley in AM- explained will need in/out caths likely before bladder muscle kicks in and she can void.   1/19- foley out this Am at 6am- hasn't voided yet- added bladder scans q6 hours and then cath of volumes /350cc- pt made aware can take a few days to kick in/the flomax. Has voided some and cathed x1-   1/20- no more caths require-d voiding on her own- con't flomax  1/22- d/c bladder scans since voiding well- only required 1 cath since foley removed fyi.   1/23- voiding well- d/c bladder scans 10: Hypertension: monitor TID and prn             -continue Lasix 40 mg daily             -continue Lopressor 50 mg BID             -add magnesium gluconate '250mg'$  HS   1/22- BP controlled- con't regimen  1/29- Lasix 40 mg BID- but home dose 40 mg daily- since Cr 1.26 up from 0.75 last week, will reduce to daily. And recheck Thursday 11: GERD?/GI prophylaxis: continue Protonix   12: B12 deficiency: continue supplementation   1/19- says itches with B12 shots, but just takes benadryl 13: Class 3 obesity: BMI = 48; weight loss counseling   1/24- BMI down to 46.62 14: Hyponatremia, mild: follow-up BMP   15: Elevated serum creatinine; 0.75 1/16 to 1.06 on 1/17             -follow-up BMP   1/18- stable- con't to monitor weekly and prn  1/29- Cr 1.16- from 0.75- will reduce Lasix 16: GBS: continue to monitor NIFs/VIC q 12 hours             -continue gabapentin   1/19- will increase  gabapaentin to 900 mg TID  1/23- slightly better- con't regimen 17: Glaucoma: continue latanoprost eye drops   18: Tobacco use: cessation counseling   19. Constipation  1/18- will con't Senna per pt request- to not increase- but if no BM by tomorrow, will try Sorbitol.   1/27- LBM this 1/25 20.. insomnia  1/20- will start Melatonin 5 mg QHS prn- don't want trazodone since can cause urinary retention  1/22- sleeping a little better 21. Anxiety  1/24- added Buspar 5 mg TID- will also add Celexa for mood/anxiety prevention 20 mg daily. 1/25- no side effects so far.    1/26-27- feels anxiety is do ing MUCH better per pt.  22. R knee buckling/pain  1/29- is getting better- will follow closely.   I spent a total of 41   minutes on total care today- >50% coordination of care- due to  D/w pt about med box- and review Lasix- and Cr/BUN.      LOS: 12 days A FACE TO FACE  EVALUATION WAS PERFORMED  Lenia Housley 10/14/2022, 8:52 AM

## 2022-10-14 NOTE — Progress Notes (Signed)
Physical Therapy Session Note  Patient Details  Name: Dana [name] MRN: [id number] Date of Birth: 18-Apr-1966  Today's Date: 10/14/2022 PT Individual Time: 0830-0900, 1330-1445 PT Individual Time Calculation (min): 30 min, 75 min   Short Term Goals: Week 2:  PT Short Term Goal 1 (Week 2): Pt will require CGA for STS with LRAD PT Short Term Goal 2 (Week 2): Pt will require CGA for standing balance with LRAD PT Short Term Goal 3 (Week 2): Pt will require max A for bed to chair transfer with LRAD  Skilled Therapeutic Interventions/Progress Updates:      Therapy Documentation Precautions:  Precautions Precautions: Fall Precaution Comments: weakness, numbness L anterior thigh. Restrictions Weight Bearing Restrictions: No  Treatment Session 1:   Pt agreeable to PT session and without verbal reports of pain. Deatra Ina, ATP from [facility], present for speciality w/c evaluation. Pt inquired about receiving a power wheel chair due to inability to propel manual w/c 2/2 sensory and motor UE deficits.  Pt unable to perform slide board, squat pivot or stand pivot transfer due to generalized weakness and proprioceptive deficits. Pt requires CGA with STEDY transfers and PT provided pt with information regarding sit to stand machine to potentially utilize in the house following discharge. Pt reports she will discuss with her family.   PT also educated pt on option to discharge to skilled nursing facility to build strength and improve functional mobility to decrease caregiver burden as pt's spouse with spinal injury and unable to lift > 40 pounds.   PT provided pt with power w/c with joystick, tilt in space, and elevating leg rests. ATP and PT provided education on how to operate and PT oriented pt to perform pressure relief every 30 minutes for 2 minutes.   Pt left seated in PWC at bedside with chair alarm and all needs within reach.   Treatment Session 2:   Pt agreeable to PT  session with emphasis on PWC mobility training. Pt without verbal reports of pain in session and received seated in recliner at bedside.   Pt reports her and her family would like to extend rehabilitation stay to increase strength and mobility prior to discharge. PT will discuss with healthcare team at conference meeting Tuesday 1/30.   Pt SBA with sit to stand with stedy from recliner and transferred to [facility]. Pt requires min A with PWC mobility >500 ft throughout session. PT educated pt on how to appropriately operate [place] settings (tilt and speed).   Pt navigated PWC through busy hallways and narrowed elevators to [facility] atrium on level and unlevel terrain.   Pt performed stedy transfer to return to bed and left semi-reclined in bed with all needs in reach and alarm on.   Therapy/Group: Individual Therapy  Verl Dicker Verl Dicker PT, DPT  10/14/2022, 7:35 AM

## 2022-10-14 NOTE — Progress Notes (Signed)
NIF >-40, VC of 1.8L performed with good effort.

## 2022-10-14 NOTE — Progress Notes (Signed)
Occupational Therapy Session Note  Patient Details  Name: Dana [name] MRN: [id number] Date of Birth: 1965/12/06  Today's Date: 10/14/2022 OT Individual Time: 1020-1127 OT Individual Time Calculation (min): 67 min    Short Term Goals: Week 2:  OT Short Term Goal 1 (Week 2): STGs=LTGs due to patient's length of stay.  Skilled Therapeutic Interventions/Progress Updates:  Pt received seated in PWC for skilled OT session with focus on functional mobility and discharge planning. Pt agreeable to interventions, demonstrating overall pleasant mood. Pt with no reports of pain. OT offering intermediate rest breaks and positioning suggestions throughout session to address potential pain/fatigue and maximize participation/safety in session.   Pt re-educated on PWC parts/purpose as patient stated ". . .this is broken" and "I'm going to fall" in reference to adjustable arm-rests and foot rests. Pt receptive of education, driving/maneuvering PWC to all therapy areas with supervision and Mod VC for obstacle avoidance. In simulated apartment, participation in kitchen task planned, but pt's daughter with multiple questions regarding current/future functioning and discharge plan. Session then focused on extensive education surrounding current ADL functioning, OT POC, DME recommendations, and possible discharge locations. Treatment team messaged with highlights of conversation as pt and family are considering a different discharge plan from what has been agreed upon until now.   In pt's room, pt performs PWC>recliner transfer using stedy and pt's daughter trialing use.   Pt remained seated in recliner with all immediate needs met at end of session. Pt continues to be appropriate for skilled OT intervention to promote further functional independence.   Therapy Documentation Precautions:  Precautions Precautions: Fall Precaution Comments: weakness, numbness L anterior thigh. Restrictions Weight Bearing  Restrictions: No   Therapy/Group: Individual Therapy  Maudie Mercury, OTR/L, MSOT  10/14/2022, 7:55 AM

## 2022-10-14 NOTE — Progress Notes (Signed)
Occupational Therapy Session Note  Patient Details  Name: Dana [name] MRN: [id number] Date of Birth: 1965/10/13  Today's Date: 10/14/2022 OT Individual Time: 800-830 AM, 30 min       Short Term Goals: Week 2:  OT Short Term Goal 1 (Week 2): STGs=LTGs due to patient's length of stay.  Skilled Therapeutic Interventions/Progress Updates:   OT, PT and ATP gathered with pt for w/c evaluation for custom motorized seating. Pt assisted to day room gym in manual w/c for energy conservation and time management for custom power vs manual wheelchair assessment based on body habitus, current neuromotor function, functional needs, ADL's, mobility as well as home needs and accessibility including taking into consideration husband's medical condition and capability re: primary caregiver. [name]- was the rep from [facility] discussed funding, pt's goals and needs for seating and assist pt and team with appropriate custom evaluation. Pt called and Face timed her husband for a brief time for his input as well. Pt has the following limitations with balance, upper and lower body sensation, strength and coordination, postural deficits due to weakness and increased body habitus with tone fluctuations. Pt also has specialized bowel and bladder deficits, significantly limited pressure relief capabilities due to sensory motor deficits related to neurological and weakness issues. OT provided input related to deficits and functional implications of new seating system needs as well as potential joystick control options for R hand interfacing for navigation. OT left vendor and PT at end of session to complete assessment. Prior to OT leaving it was determined that manual seating would not be an option and PWC will be required.    Therapy Documentation Precautions:  Precautions Precautions: Fall Precaution Comments: weakness, numbness L anterior thigh. Restrictions Weight Bearing Restrictions: No   Therapy/Group:  Individual Therapy  Dana [name] 10/14/2022, 8:19 AM

## 2022-10-14 NOTE — Progress Notes (Signed)
NIF -40 VC 2.08 Great patient effort.

## 2022-10-15 NOTE — Progress Notes (Signed)
Orthopedic Tech Progress Note Patient Details:  Dana [name] Jan 10, 1966 [id number]  Called in order to [employer] for a KNEE BRACE  Patient ID: Dana [name], female   DOB: [birth year], 57 y.o.   MRN: [id number]  [name] 10/15/2022, 1:19 PM

## 2022-10-15 NOTE — Progress Notes (Signed)
RT obtianed NIF and VC with good patient effort.  NIF -40 VC 2.02L

## 2022-10-15 NOTE — Progress Notes (Signed)
Physical Therapy Session Note  Patient Details  Name: Dana [name] MRN: [id number] Date of Birth: May 19, 1966  Today's Date: 10/15/2022 PT Individual Time: 0800-0908, 1445-1530 PT Individual Time Calculation (min): 68 min , 45 min   Short Term Goals: Week 2:  PT Short Term Goal 1 (Week 2): Pt will require CGA for STS with LRAD PT Short Term Goal 2 (Week 2): Pt will require CGA for standing balance with LRAD PT Short Term Goal 3 (Week 2): Pt will require max A for bed to chair transfer with LRAD  Skilled Therapeutic Interventions/Progress Updates:      Therapy Documentation Precautions:  Precautions Precautions: Fall Precaution Comments: weakness, numbness L anterior thigh. Restrictions Weight Bearing Restrictions: No  Treatment Session 1:   Pt agreeable to PT session with emphasis on transfer training and received seated edge of bed. Pt requests to perform morning self-care routine and is set-up at the sink for oral care and face washing.   Pt states "it is good" when inquired about pain level.   Pt SBA with STEDY transfer from bed height and transferred to [facility] for safety.   Pt navigated PWC in busy environment with obstacle negotiation with close supervision for safety. PT tightened joystick and elevating leg rests.   Pt requires CGA with sit to stand from [place] x 4 and presents with increased confidence. Pt able to progress to lateral weight shifting and heel raises and initated step with right foot and stated knee instability, deferred further activity. Pt provided with ice pack for knee pain and navigated PWC to room.   MD arrived and rounded with patient and discussed discharge planning. Pt reports she wants additional rehab and would like to d/c to SNF.   Pt SBA with Stedy transfer and left seated in recliner at bedside with all needs in reach and alarm on.    Treatment Session 2:   Pt received seated in recliner at bedside with spouse present for family education.  Pt without verbal reports of pain in session.   PT switched w/c cushion from 24 inch to 20 inch for more appropriate fit. Pt performed STEDY transfer from recliner SBA and transported to [facility]. Pt reports new cushion is an appropriate fit.   PT educated pt's spouse regarding STEDY transfers and sit to stand transfers with RW. Pt demonstrated sit to stand CGA from [place].   PT explained and presented dependent manual hoyer lift. Pt and spouse declined future use of device.   Pt left seated in recliner at bedside with all needs in reach and spouse present.   Therapy/Group: Individual Therapy  Verl Dicker Verl Dicker PT, DPT  10/15/2022, 7:45 AM

## 2022-10-15 NOTE — Progress Notes (Signed)
RT arrived bedside to attempt NIF and VC. Patient was not in room and is currently at therapy. RT will attempt again later.

## 2022-10-15 NOTE — Progress Notes (Signed)
VC of 1.6 NIF -24 Pt seems tired at this time.

## 2022-10-15 NOTE — Patient Care Conference (Signed)
Inpatient RehabilitationTeam Conference and Plan of Care Update Date: 10/15/2022   Time: 11:32 AM    Patient Name: Dana [name]      Medical Record Number: [id number]  Date of Birth: Sep 04, 1966 Sex: Female         Room/Bed: 4W04C/4W04C-01 Payor Info: Payor: Theme park manager / Plan: [place] / Product Type: *No Product type* /    Admit Date/Time:  10/02/2022  1:50 PM  Primary Diagnosis:  Guillain Barr syndrome [facility])  Hospital Problems: Principal Problem:   Guillain Barr syndrome [facility])    Expected Discharge Date: Expected Discharge Date:  (SNF)  Team Members Present: Physician leading conference: Dr. Courtney Heys Social Worker Present: Ovidio Kin, LCSW Nurse Present: Tacy Learn, RN PT Present: Verl Dicker, PT OT Present: Jamey Ripa, OT;Other (comment) Vonita Moss, OT) PPS Coordinator present : Gunnar Fusi, SLP     Current Status/Progress Goal Weekly Team Focus  Bowel/Bladder   pt is continent of b/b, occasional incontinent episodes.   Regain full continence   Assist w/toileting q2-4 hours and prn    Swallow/Nutrition/ Hydration               ADL's   Stedy for functional transfers, LB bathing/dressing with Mod A + AE, UB bathing/dressing with setup   Mod I at [facility] level, Min A for functional transfers   Continue AE training for ADLs, family education, transfer equipment options    Mobility   CGA bed, CGA/min STS with HDRW, Stedy for bed <>chair transfer, PWC & SNF   min A / CGA  transfer training, pt/fam educaton, w/c eval, d/c planning    Communication                Safety/Cognition/ Behavioral Observations               Pain   pt complains of generalized pain 10/10 pain score, prn dilaudid given   <3 pain score   Assess pain qshift and prn    Skin   Skin intact   Maintain skin integrity  Assess skin qshift and prn      Discharge Planning:  Daughter was here yesterday and reports pt will need SNF  due to her husband can not assist her at discharge. Will begin process   Team Discussion: GBS. Continent B/B. Knee pain controlled with PRN medications. Skin is CDI. Fall with therapy 01/28. Ace wraps to BLE for swelling. Wheelchair evaluation completed if needed. Stedy for transfers. AFO for compression to right knee. OT goals decreased. Set up for UB, [place] for LB.  Patient on target to meet rehab goals: SNF placement pending.   *See Care Plan and progress notes for long and short-term goals.   Revisions to Treatment Plan:  Medication adjustments, monitor labs  Teaching Needs: Medications, safety, self care, gait/transfer training, skin care, etc  Current Barriers to Discharge: Home enviroment access/layout, Lack of/limited family support, and Weight  Possible Resolutions to Barriers: Awaiting SNF placement.      Medical Summary Current Status: less tingling in hands- anxiety still an issue- limting therapy; usually continent- CGA sit-stand now; but using Stedy to transfer; got power w/c eval  Barriers to Discharge: Behavior/Mood;Morbid Obesity;Weight bearing restrictions;Other (comments)  Barriers to Discharge Comments: weakness due to GBS with sensory deficits- maybe axonal GBS Possible Resolutions to [employer] Focus: Husband is disabled- SCI- will need to send to SNF; since cannot get home with current care at home; power w/c here, but cannot have at [facility];  will get [facility] d/c to SNF when possible   Continued Need for Acute Rehabilitation Level of Care: The patient requires daily medical management by a physician with specialized training in physical medicine and rehabilitation for the following reasons: Direction of a multidisciplinary physical rehabilitation program to maximize functional independence : Yes Medical management of patient stability for increased activity during participation in an intensive rehabilitation regime.: Yes Analysis of laboratory values and/or  radiology reports with any subsequent need for medication adjustment and/or medical intervention. : Yes   I attest that I was present, lead the team conference, and concur with the assessment and plan of the team.   Ernest Pine 10/15/2022, 4:11 PM

## 2022-10-15 NOTE — NC FL2 (Signed)
[place] LEVEL OF CARE FORM     IDENTIFICATION  Patient Name: Dana [name] Birthdate: 07/29/1966 Sex: female Admission Date (Current Location): 10/02/2022  [facility] and [place] Number:  Herbalist and Address:  [facility] [place] 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   UTI (urinary tract infection) 09/24/2022   Hypomagnesemia 09/24/2022   Debility 09/23/2022   Hypokalemia 09/23/2022   B12 deficiency 09/05/2022   Folate deficiency 09/05/2022   Carpal tunnel syndrome, left upper limb 05/11/2019   Spinal stenosis of lumbar region with neurogenic claudication 08/20/2018   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   Primary osteoarthritis of right knee 02/27/2017   Right tennis elbow 11/11/2016   Muscle cramps 01/15/2016   Bilateral leg edema 01/08/2016   Radiculopathy 12/20/2015   Hyperglycemia 12/21/2014   Mastodynia, female 11/30/2014   S/P excision of fibroadenoma of breast 11/30/2014   Angioedema of lips 04/07/2014   Fibroadenoma of right breast 03/07/2014   Pelvic pain in female 06/19/2012   Menorrhagia 06/11/2012   SUI (stress urinary incontinence, female) 06/11/2012   HTN (hypertension) 06/11/2012   IBS (irritable bowel syndrome) - diarrhea predominant 03/17/2012   MICROSCOPIC HEMATURIA 11/30/2010   BREAST PAIN, RIGHT 11/30/2010   BREAST MASS, RIGHT 01/12/2010   HYPERCHOLESTEROLEMIA 12/07/2007   CIGARETTE SMOKER 12/07/2007   DEGENERATIVE JOINT  DISEASE 12/07/2007   Low back pain with sciatica 12/07/2007   HEADACHE 12/07/2007   Obesity 08/03/2007   Anxiety state 08/03/2007    Orientation RESPIRATION BLADDER Height & Weight     Self, Time, Situation, Place  Normal Continent Weight: 274 lb 11.1 oz (124.6 kg) Height:  '5\' 4"'$  (162.6 cm)  BEHAVIORAL SYMPTOMS/MOOD NEUROLOGICAL BOWEL NUTRITION STATUS      Continent Diet (regular thin liquids)  AMBULATORY STATUS COMMUNICATION OF NEEDS Skin   Extensive Assist Verbally Normal                       Personal Care Assistance Level of Assistance  Bathing, Dressing Bathing Assistance: Limited assistance   Dressing Assistance: Limited assistance     Functional Limitations Info             SPECIAL CARE FACTORS FREQUENCY  PT (By licensed PT), OT (By licensed OT)     PT Frequency: 5x week OT Frequency: 5x week            Contractures Contractures Info: Not present    Additional Factors Info  Code Status, Allergies Code Status Info: Full Code Allergies Info: Lisinopril, Codeine,Hydrocodone, Pencillins, Amlopine, Acetaminophen Cyancobalamin           Current Medications (10/15/2022):  This is the current hospital active medication list Current Facility-Administered Medications  Medication Dose Route Frequency Provider Last Rate Last Admin   alum & mag hydroxide-simeth (MAALOX/MYLANTA) 200-200-20 MG/5ML suspension 30 mL  30 mL Oral Q4H PRN Setzer, Edman Circle, PA-C       bupivacaine(PF) (MARCAINE) 0.5 % injection 10 mL  10 mL  Infiltration Once Lisette Abu, PA-C       busPIRone (BUSPAR) tablet 5 mg  5 mg Oral TID Courtney Heys, MD   5 mg at 10/15/22 0732   camphor-menthol (SARNA) lotion   Topical PRN Raulkar, Clide Deutscher, MD       citalopram (CELEXA) tablet 20 mg  20 mg Oral Daily Lovorn, Megan, MD   20 mg at 10/15/22 0731   cyclobenzaprine (FLEXERIL) tablet 10 mg  10 mg Oral BID PRN Barbie Banner, PA-C   10 mg at 10/11/22 0753   cyclobenzaprine (FLEXERIL)  tablet 10 mg  10 mg Oral QHS Barbie Banner, PA-C   10 mg at 10/14/22 2156   diphenhydrAMINE (BENADRYL) 12.5 MG/5ML elixir 12.5-25 mg  12.5-25 mg Oral Q6H PRN Barbie Banner, PA-C       dorzolamide-timolol (COSOPT) 2-0.5 % ophthalmic solution 1 drop  1 drop Both Eyes BID Barbie Banner, PA-C   1 drop at 10/15/22 0732   enoxaparin (LOVENOX) injection 60 mg  60 mg Subcutaneous Q24H Barbie Banner, PA-C   60 mg at 69/48/54 6270   folic acid (FOLVITE) tablet 1 mg  1 mg Oral Daily Barbie Banner, PA-C   1 mg at 10/15/22 0732   furosemide (LASIX) tablet 40 mg  40 mg Oral Daily Lovorn, Jinny Blossom, MD   40 mg at 10/15/22 0732   gabapentin (NEURONTIN) capsule 900 mg  900 mg Oral TID Courtney Heys, MD   900 mg at 10/15/22 0732   guaiFENesin-dextromethorphan (ROBITUSSIN DM) 100-10 MG/5ML syrup 5-10 mL  5-10 mL Oral Q6H PRN Barbie Banner, PA-C       HYDROmorphone (DILAUDID) tablet 4 mg  4 mg Oral Q6H PRN Barbie Banner, PA-C   4 mg at 10/15/22 0732   latanoprost (XALATAN) 0.005 % ophthalmic solution 1 drop  1 drop Both Eyes QHS Barbie Banner, PA-C   1 drop at 10/14/22 2157   magnesium gluconate (MAGONATE) tablet 250 mg  250 mg Oral QHS Izora Ribas, MD   250 mg at 10/14/22 2156   magnesium hydroxide (MILK OF MAGNESIA) suspension 30 mL  30 mL Oral Daily PRN Barbie Banner, PA-C       melatonin tablet 5 mg  5 mg Oral QHS PRN Lovorn, Megan, MD       methylPREDNISolone acetate (DEPO-MEDROL) injection 80 mg  80 mg Intra-articular Once Lisette Abu, PA-C       metoprolol tartrate (LOPRESSOR) tablet 50 mg  50 mg Oral BID Barbie Banner, PA-C   50 mg at 10/15/22 3500   multivitamin with minerals tablet 1 tablet  1 tablet Oral Daily Barbie Banner, PA-C   1 tablet at 10/15/22 0731   pantoprazole (PROTONIX) EC tablet 40 mg  40 mg Oral Daily Barbie Banner, PA-C   40 mg at 10/15/22 0732   prochlorperazine (COMPAZINE) tablet 5-10 mg  5-10 mg Oral Q6H PRN Barbie Banner, PA-C       Or    prochlorperazine (COMPAZINE) injection 5-10 mg  5-10 mg Intramuscular Q6H PRN Barbie Banner, PA-C       Or   prochlorperazine (COMPAZINE) suppository 12.5 mg  12.5 mg Rectal Q6H PRN Setzer, Edman Circle, PA-C       senna-docusate (Senokot-S) tablet 1 tablet  1 tablet Oral BID Barbie Banner, PA-C   1 tablet at 10/15/22 0731   sodium phosphate (FLEET) 7-19 GM/118ML enema 1 enema  1  enema Rectal Once PRN Barbie Banner, PA-C       sorbitol 70 % solution 30 mL  30 mL Oral Daily PRN Barbie Banner, PA-C       tamsulosin [facility]) capsule 0.4 mg  0.4 mg Oral QPC supper Lovorn, Megan, MD   0.4 mg at 10/14/22 1838   traZODone (DESYREL) tablet 25-50 mg  25-50 mg Oral QHS PRN Barbie Banner, PA-C   50 mg at 10/09/22 2026   vitamin B-12 (CYANOCOBALAMIN) tablet 100 mcg  100 mcg Oral Daily Barbie Banner, PA-C   100 mcg at 10/15/22 1829     Discharge Medications: Please see discharge summary for a list of discharge medications.  Relevant Imaging Results:  Relevant Lab Results:   Additional Information SSN: [id number] Needs more rehab beofre going home with husband who has health issues of his own  Audry Riles, Gardiner Rhyme, LCSW

## 2022-10-15 NOTE — Plan of Care (Signed)
Problem: RH Furniture Transfers Goal: LTG Patient will perform furniture transfers w/assist (OT/PT) Description: LTG: Patient will perform furniture transfers  with assistance (OT/PT). Flowsheets (Taken 10/15/2022 1606) LTG: Pt will perform furniture transfers with assist:: (Downgraded) Other (Comment) Note: Downgraded this date due to patient's current level of functioning and high level of assist needed. Pt expected to reach independence with DIRECTING use of lift equipment for safe transfers.    Problem: RH Balance Goal: LTG Patient will maintain dynamic standing with ADLs (OT) Description: LTG:  Patient will maintain dynamic standing balance with assist during activities of daily living (OT)  Flowsheets (Taken 10/15/2022 1606) LTG: Pt will maintain dynamic standing balance during ADLs with: (Downgraded) Other (comment) Note: Downgraded this date due to patient's current level of functioning and high level of assist needed. Pt expected to reach independence with DIRECTING use of lift equipment for safe transfers.    Problem: Sit to Stand Goal: LTG:  Patient will perform sit to stand in prep for activites of daily living with assistance level (OT) Description: LTG:  Patient will perform sit to stand in prep for activites of daily living with assistance level (OT) Flowsheets (Taken 10/15/2022 1606) LTG: PT will perform sit to stand in prep for activites of daily living with assistance level: (Downgraded) Other (comment) Note: Downgraded this date due to patient's current level of functioning and high level of assist needed. Pt expected to reach independence with DIRECTING use of lift equipment for safe transfers.    Problem: RH Bathing Goal: LTG Patient will bathe all body parts with assist levels (OT) Description: LTG: Patient will bathe all body parts with assist levels (OT) Flowsheets (Taken 10/15/2022 1606) LTG: Pt will perform bathing with assistance level/cueing: (Downgraded) Moderate  Assistance - Patient 50 - 74% Note: Downgraded this date due to patient's current level of functioning and high level of assist needed.    Problem: RH Dressing Goal: LTG Patient will perform lower body dressing w/assist (OT) Description: LTG: Patient will perform lower body dressing with assist, with/without cues in positioning using equipment (OT) Flowsheets (Taken 10/15/2022 1606) LTG: Pt will perform lower body dressing with assistance level of: (Downgraded) Moderate Assistance - Patient 50 - 74% Note: Downgraded this date due to patient's current level of functioning and high level of assist needed.    Problem: RH Toileting Goal: LTG Patient will perform toileting task (3/3 steps) with assistance level (OT) Description: LTG: Patient will perform toileting task (3/3 steps) with assistance level (OT)  Flowsheets (Taken 10/15/2022 1606) LTG: Pt will perform toileting task (3/3 steps) with assistance level: (Downgraded) Moderate Assistance - Patient 50 - 74% Note: Downgraded this date due to patient's current level of functioning and high level of assist needed.    Problem: RH Laundry Goal: LTG Patient will perform laundry w/assist, cues (OT) Description: LTG: Patient will perform laundry with assistance, with/without cues (OT). Outcome: Not Applicable Note: Pt goal not applicable at this time due to patient's current level of functioning and high level of assist needed for BADLs.    Problem: RH Toilet Transfers Goal: LTG Patient will perform toilet transfers w/assist (OT) Description: LTG: Patient will perform toilet transfers with assist, with/without cues using equipment (OT) Flowsheets (Taken 10/15/2022 1606) LTG: Pt will perform toilet transfers with assistance level of: (Downgraded) Other (Comment) Note: Downgraded this date due to patient's current level of functioning and high level of assist needed. Pt expected to reach independence with DIRECTING use of lift equipment for safe  transfers.  Problem: RH Tub/Shower Transfers Goal: LTG Patient will perform tub/shower transfers w/assist (OT) Description: LTG: Patient will perform tub/shower transfers with assist, with/without cues using equipment (OT) Flowsheets (Taken 10/15/2022 1606) LTG: Pt will perform tub/shower stall transfers with assistance level of: (Downgraded) Other (Comment) Note: Downgraded this date due to patient's current level of functioning and high level of assist needed. Pt expected to reach independence with DIRECTING use of lift equipment for safe transfers.

## 2022-10-15 NOTE — Progress Notes (Addendum)
Patient ID: Dana [name], female   DOB: [birth year], 57 y.o.   MRN: [id number] Met with pt to discuss new plan now. She feels she needs more rehab before going home. Have given her the SNF list for [facility] and she and husband will look at it when he is here later today and get back with worker of top three choices. Pt feels she needs more rehab and does not want to burden her husband who also has health issues. Will work on SNF aware will need to see if her [facility] will cover it since it is primary insurance and if denies will try her medicare.  12:41 PM Have updated pt regarding team conference progress toward her goals and was able to stand with plus one-PT instead of plus two for safety. Will continue to work on discharge plan and get choices from her in am after she and husband pick top three.  3:11 PM Husband is here and met with to discuss choices he reports [facility] and [facility] are the closet and would be their first two choices. Will begin working on this.

## 2022-10-15 NOTE — Progress Notes (Signed)
PROGRESS NOTE   Subjective/Complaints:  Pt getting ready to get on toilet- used stedy to do so with nurse/NT.   Pt reports pain stable Wants to go to SNF  ROS:   Pt denies SOB, abd pain, CP, N/V/C/D, and vision changes Except for HPI Objective:   No results found. Recent Labs    10/14/22 0555  WBC 8.3  HGB 10.5*  HCT 33.1*  PLT 316    Recent Labs    10/14/22 0555  NA 137  K 4.1  CL 97*  CO2 29  GLUCOSE 91  BUN 22*  CREATININE 1.16*  CALCIUM 8.8*     Intake/Output Summary (Last 24 hours) at 10/15/2022 0844 Last data filed at 10/15/2022 0803 Gross per 24 hour  Intake 1009 ml  Output --  Net 1009 ml        Physical Exam: Vital Signs Blood pressure 129/64, pulse 65, temperature 98.8 F (37.1 C), resp. rate 19, height '5\' 4"'$  (1.626 m), weight 124.6 kg, last menstrual period 08/17/2012, SpO2 97 %.         General: awake, alert, appropriate, stood with steady and nurse/NT; transferred to toilet;  NAD HENT: conjugate gaze; oropharynx moist CV: regular rate; no JVD Pulmonary: CTA B/L; no W/R/R- good air movement GI: soft, NT, ND, (+)BS- BMI 47 Psychiatric: appropriate; anxious Neurological: Ox3  Sensory deficits esp in Ue's  Ext: no clubbing, cyanosis, or edema Psych: pleasant and cooperative  MS: less TTP in R knee- effusion slightly better; B/ hand shaking- due to weakness sin hands/Ue's.  Neuro: Alert and oriented x 3. Normal insight and awareness. Intact Memory. Normal language and speech. Cranial nerve exam unremarkable. Stocking glove sensory loss BLE L>>R. BUE both hands/wrists. UE strength 4/5 except distal is 4-/5 LE's- HF 2+/5; KE 4-/5; DF 3+ to 4-/5 and PF 4- to 4/5 B/L  Skin: multiple IV's and blood draw tape areas-   Assessment/Plan: 1. Functional deficits which require 3+ hours per day of interdisciplinary therapy in a comprehensive inpatient rehab setting. Physiatrist is  providing close team supervision and 24 hour management of active medical problems listed below. Physiatrist and rehab team continue to assess barriers to discharge/monitor patient progress toward functional and medical goals  Care Tool:  Bathing    Body parts bathed by patient: Right arm, Left arm, Chest, Abdomen, Front perineal area, Right upper leg, Left upper leg, Face   Body parts bathed by helper: Buttocks, Right lower leg, Left lower leg     Bathing assist Assist Level: Moderate Assistance - Patient 50 - 74%     Upper Body Dressing/Undressing Upper body dressing   What is the patient wearing?: Pull over shirt    Upper body assist Assist Level: Set up assist    Lower Body Dressing/Undressing Lower body dressing      What is the patient wearing?: Underwear/pull up, Pants     Lower body assist Assist for lower body dressing: Maximal Assistance - Patient 25 - 49%     Toileting Toileting    Toileting assist Assist for toileting: 2 Helpers     Transfers Chair/bed transfer  Transfers assist     Chair/bed transfer assist  level: Contact Guard/Touching assist (STEDY)     Locomotion Ambulation   Ambulation assist   Ambulation activity did not occur: Safety/medical concerns          Walk 10 feet activity   Assist  Walk 10 feet activity did not occur: Safety/medical concerns        Walk 50 feet activity   Assist Walk 50 feet with 2 turns activity did not occur: Safety/medical concerns         Walk 150 feet activity   Assist Walk 150 feet activity did not occur: Safety/medical concerns         Walk 10 feet on uneven surface  activity   Assist Walk 10 feet on uneven surfaces activity did not occur: Safety/medical concerns         Wheelchair     Assist Is the patient using a wheelchair?: Yes Type of Wheelchair: Power    Wheelchair assist level: Minimal Assistance - Patient > 75% Max wheelchair distance: 100 ft     Wheelchair 50 feet with 2 turns activity    Assist        Assist Level: Minimal Assistance - Patient > 75%   Wheelchair 150 feet activity     Assist      Assist Level: Maximal Assistance - Patient 25 - 49%   Blood pressure 129/64, pulse 65, temperature 98.8 F (37.1 C), resp. rate 19, height '5\' 4"'$  (1.626 m), weight 124.6 kg, last menstrual period 08/17/2012, SpO2 97 %.  Medical Problem List and Plan: 1. 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  -discussed time course of recovery in GBS. Tried to reassure pt that she will see further improvement in sensory function.  1/30- team conference to f/u on progress Con't CIR PT and OT 2.  Antithrombotics: -DVT/anticoagulation:  Pharmaceutical: Lovenox start 1/17             -antiplatelet therapy: none   3. 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 Mood/Behavior/Sleep: LCSW to evaluate and provide emotional support             -antipsychotic agents: n/a   1/24- anxiety is limiting therapy- will add Buspar and Celexa for anxiety  1/28 a little frustrated by ongoing deficits  1/30- thinks need SNF due to deficits- frustrated by this 5. Neuropsych/cognition: This patient is capable of making decisions on her own behalf.   6. Skin/Wound Care: Routine skin care checks   7. Fluids/Electrolytes/Nutrition: Routine Is and Os and follow-up chemistries             -protein supplements for hypoalbuminemia             -continue  folic acid, Z02             -continue MVI   1/18- will  check with pharmacy if needs B12 shots AND daily pills.  8: Chronic back pain/radiculopathy:  -continue Dilaudid 4 mg q 6 hours - is her home medicine (PTA per Dr. [name])             -continue gabapentin 600 mg TID             -continue Lidoderm patch daily             -continue Flexeril 10 mg TID prn   1/26- will d/c Lidoderm- not using.  Otherwise, pain controlled- con't regimen 9: Urinary retention: Foley replaced on 1/15             -treated for UTI with Rocephin for  7 days   1/18- will start Flomax 0.4 mg qsupper and will try and remove Foley in AM- explained will need in/out caths likely before bladder muscle kicks in and she can void.   1/19- foley out this Am at 6am- hasn't voided yet- added bladder scans q6 hours and then cath of volumes /350cc- pt made aware can take a few days to kick in/the flomax. Has voided some and cathed x1-   1/20- no more caths require-d voiding on her own- con't flomax  1/22- d/c bladder scans since voiding well- only required 1 cath since foley removed fyi.   1/23- voiding well- d/c bladder scans 10: Hypertension: monitor TID and prn             -continue Lasix 40 mg daily             -continue Lopressor 50 mg BID             -add magnesium gluconate '250mg'$  HS   1/22- BP controlled- con't regimen  1/29- Lasix 40 mg BID- but home dose 40 mg daily- since Cr 1.26 up from 0.75 last week, will reduce to daily. And recheck Thursday 11: GERD?/GI prophylaxis: continue Protonix   12: B12 deficiency: continue supplementation   1/19- says itches with B12 shots, but just takes benadryl 13: Class 3 obesity: BMI = 48; weight loss counseling   1/24- BMI down to 46.62 14: Hyponatremia, mild: follow-up BMP   15: Elevated serum creatinine; 0.75 1/16 to 1.06 on 1/17             -follow-up BMP   1/18- stable- con't to monitor weekly and prn  1/29- Cr 1.16- from 0.75- will reduce Lasix 16: GBS: continue to  monitor NIFs/VIC q 12 hours             -continue gabapentin   1/19- will increase gabapaentin to 900 mg TID  1/23- slightly better- con't regimen 17: Glaucoma: continue latanoprost eye drops   18: Tobacco use: cessation counseling   19. Constipation  1/18- will con't Senna per pt request- to not increase- but if no BM by tomorrow, will try Sorbitol.   1/27- LBM this 1/25 20.. insomnia  1/20- will start Melatonin 5 mg QHS prn- don't want trazodone since can cause urinary retention  1/22- sleeping a little better 21. Anxiety  1/24- added Buspar 5 mg TID- will also add Celexa for mood/anxiety prevention 20 mg daily. 1/25- no side effects so far.    1/26-27- feels anxiety is do ing MUCH better per pt.  22. R knee buckling/pain  1/29- is getting better- will follow closely.    I spent a total of  55  minutes on total care today- >50% coordination of care- due to  Seeing pt twice, to discuss SNF; team conference as well to f/u on progress  LOS: 13 days A FACE TO [place] 10/15/2022, 8:44 AM

## 2022-10-15 NOTE — Progress Notes (Signed)
Occupational Therapy Session Note  Patient Details  Name: Dana [name] MRN: [id number] Date of Birth: September 01, 1966  Today's Date: 10/15/2022 OT Individual Time: 5643-3295 OT Individual Time Calculation (min): 57 min   Today's Date: 10/15/2022 OT Individual Time: 1305-1350 OT Individual Time Calculation (min): 45 min   Short Term Goals: Week 2:  OT Short Term Goal 1 (Week 2): STGs=LTGs due to patient's length of stay.  Skilled Therapeutic Interventions/Progress Updates:   Session 1: Pt received resting in recliner for skilled OT session with focus on UE strengthening/ROM. Pt agreeable to interventions, demonstrating overall pleasant mood. Pt with un-rated pain throughout R-leg, stating "it's nothing but arthritidis". OT offering intermediate rest breaks and positioning suggestions throughout session to address pain/fatigue and maximize participation/safety in session.   Pt performs recliner<>PWC with use of stedy and Min A for initial STS.   Pt drives/maneuvers PWC to all therapy areas with supervision. In day room, pt participates in series of UE strengthening/ROM exercises:  -Bicep Curls 2x10 -Chest Press 2x10 -Overhead Press 1x5  -Overhead Holds 5x5 sec holds -Chest Press Holds 5x5 sec holds  Pt demonstrating improving levels of UE control with above movements, using 1# dowel bar for each exercise.   Pt remained resting in recliner with all immediate needs met at end of session. Pt continues to be appropriate for skilled OT intervention to promote further functional independence.   Session 2: Pt received seated in [facility] for skilled OT session with focus on toileting and bilateral hand coordination. Pt agreeable to interventions, demonstrating overall pleasant mood. Pt with un-rated pain, stating "it's doing fine" in reference to knee pain. OT offering intermediate rest breaks and positioning suggestions throughout session to address pain/fatigue and maximize participation/safety  in session.   Pt with urgency upon OT arrival, using stedy for transfer and requiring Max A to complete 3/3 toileting tasks.  Pt driving/maneuvering PWC to all therapy locations with mod verbal cuing for safety/pace. In day room, pt participates in table-top bilateral coordination peg-activity, activity graded down to thicker pegs due to decreased FM control.   Pt remained resting in recliner with all immediate needs met at end of session. Pt continues to be appropriate for skilled OT intervention to promote further functional independence.   Therapy Documentation Precautions:  Precautions Precautions: Fall Precaution Comments: weakness, numbness L anterior thigh. Restrictions Weight Bearing Restrictions: No  Therapy/Group: Individual Therapy  Maudie Mercury, OTR/L, MSOT  10/15/2022, 6:37 AM

## 2022-10-16 NOTE — Progress Notes (Signed)
Physical Therapy Session Note  Patient Details  Name: LARENE ASCENCIO MRN: [id number] Date of Birth: [birth year]  Today's Date: 10/16/2022 PT Individual Time: 1694-5038, [id number]  PT Individual Time Calculation (min): 75 min , 58 min   Short Term Goals: Week 2:  PT Short Term Goal 1 (Week 2): Pt will require CGA for STS with LRAD PT Short Term Goal 2 (Week 2): Pt will require CGA for standing balance with LRAD PT Short Term Goal 3 (Week 2): Pt will require max A for bed to chair transfer with LRAD  Skilled Therapeutic Interventions/Progress Updates:      Therapy Documentation Precautions:  Precautions Precautions: Fall Precaution Comments: weakness, numbness L anterior thigh. Restrictions Weight Bearing Restrictions: No  Treatment Session 1:   Pt received semi-reclined in bed reporting 10/10 generalized pain, nursing notifed and provided pt with pain medication.   Pt requires (S) for safety with bed mobility with use of bed features and sit to stand with STEDY from bed level. Pt transferred to [facility] and PT dependently donned 6 inch ace wrap for pain relief to right knee.   Pt navigated PWC to dayroom with (S) for safety and performed 2 sit to stand transfers with HDRW and progressed to lateral weight shifting to increase R LE weight bearing.   Pt transitioned to UE/LE exercises with emphasis on strength and coordination training with resistance to address ataxia:   -elbow extension with 1# dumb bell 3 x 10 bilaterally   -resisted elbow extension with red thera band 3 x 10 bilaterally   -LAQ 3 x 10 bilaterally   -red thera band resisted LAQ 2 x 10 R LE   Pt navigated PWC to room and performed stedy transfer with supervision to recliner. Pt left seated in recliner at bedside with all needs in reach and alarm on.   Treatment Session 2:   Pt agreeable to PT session with emphasis on transfer training. Pt with unrated right knee pain and neoprene sleeve brace for comfort and rest  breaks for relief.   Pt (S) with STEDY transfer from recliner to [facility] and navigated w/c to dayroom while negotiating obstacles.   Pt performed 3 sit to stand with HDRW and CGA and able to tolerate standing ~2 minutes each bout. Pt also performed sit to stand with tennis shoes as trial. In standing pt able to demonstrate lateral weight shifting and heel raises, deferred progression to stepping as pt with increased pain.   Pt navigated PWC to room and left in care of nursing for toilet transfer for time management.    Therapy/Group: Individual Therapy  Verl Dicker Verl Dicker PT, DPT  10/16/2022, 7:49 AM

## 2022-10-16 NOTE — Progress Notes (Signed)
NIF -40 VC 1.7L

## 2022-10-16 NOTE — Progress Notes (Signed)
PROGRESS NOTE   Subjective/Complaints:  Pt reports R knee doing OK today Was measured for brace Pins and needles and burning not occurring in feet anymore or hands- just heaviness and numbness.  Bowels "going well" ROS:   Pt denies SOB, abd pain, CP, N/V/C/D, and vision changes  Except for HPI Objective:   No results found. Recent Labs    10/14/22 0555  WBC 8.3  HGB 10.5*  HCT 33.1*  PLT 316    Recent Labs    10/14/22 0555  NA 137  K 4.1  CL 97*  CO2 29  GLUCOSE 91  BUN 22*  CREATININE 1.16*  CALCIUM 8.8*     Intake/Output Summary (Last 24 hours) at 10/16/2022 0916 Last data filed at 10/16/2022 0721 Gross per 24 hour  Intake 358 ml  Output --  Net 358 ml        Physical Exam: Vital Signs Blood pressure 128/67, pulse 70, temperature 98.6 F (37 C), resp. rate 18, height '5\' 4"'$  (1.626 m), weight 124.6 kg, last menstrual period 08/17/2012, SpO2 98 %.          General: awake, alert, appropriate, sitting up in bed;  NAD HENT: conjugate gaze; oropharynx moist CV: regular rate; no JVD Pulmonary: CTA B/L; no W/R/R- good air movement GI: soft, NT, ND, (+)BS Psychiatric: appropriate Neurological: Ox3  Sensory deficits esp in Ue's  Ext: no clubbing, cyanosis, or edema Psych: pleasant and cooperative  MS: less TTP in R knee- effusion slightly better; B/ hand shaking- due to weakness sin hands/Ue's.  Neuro: Alert and oriented x 3. Normal insight and awareness. Intact Memory. Normal language and speech. Cranial nerve exam unremarkable. Stocking glove sensory loss BLE L>>R. BUE both hands/wrists. UE strength 4/5 except distal is 4-/5 LE's- HF 2+/5; KE 4-/5; DF 3+ to 4-/5 and PF 4- to 4/5 B/L  Skin: multiple IV's and blood draw tape areas-   Assessment/Plan: 1. Functional deficits which require 3+ hours per day of interdisciplinary therapy in a comprehensive inpatient rehab setting. Physiatrist  is providing close team supervision and 24 hour management of active medical problems listed below. Physiatrist and rehab team continue to assess barriers to discharge/monitor patient progress toward functional and medical goals  Care Tool:  Bathing    Body parts bathed by patient: Right arm, Left arm, Chest, Abdomen, Front perineal area, Right upper leg, Left upper leg, Face   Body parts bathed by helper: Buttocks, Right lower leg, Left lower leg     Bathing assist Assist Level: Moderate Assistance - Patient 50 - 74%     Upper Body Dressing/Undressing Upper body dressing   What is the patient wearing?: Pull over shirt    Upper body assist Assist Level: Set up assist    Lower Body Dressing/Undressing Lower body dressing      What is the patient wearing?: Underwear/pull up, Pants     Lower body assist Assist for lower body dressing: Maximal Assistance - Patient 25 - 49%     Toileting Toileting    Toileting assist Assist for toileting: 2 Helpers     Transfers Chair/bed transfer  Transfers assist     Chair/bed transfer assist  level: Contact Guard/Touching assist (STEDY)     Locomotion Ambulation   Ambulation assist   Ambulation activity did not occur: Safety/medical concerns          Walk 10 feet activity   Assist  Walk 10 feet activity did not occur: Safety/medical concerns        Walk 50 feet activity   Assist Walk 50 feet with 2 turns activity did not occur: Safety/medical concerns         Walk 150 feet activity   Assist Walk 150 feet activity did not occur: Safety/medical concerns         Walk 10 feet on uneven surface  activity   Assist Walk 10 feet on uneven surfaces activity did not occur: Safety/medical concerns         Wheelchair     Assist Is the patient using a wheelchair?: Yes Type of Wheelchair: Power    Wheelchair assist level: Minimal Assistance - Patient > 75% Max wheelchair distance: 100 ft     Wheelchair 50 feet with 2 turns activity    Assist        Assist Level: Minimal Assistance - Patient > 75%   Wheelchair 150 feet activity     Assist      Assist Level: Maximal Assistance - Patient 25 - 49%   Blood pressure 128/67, pulse 70, temperature 98.6 F (37 C), resp. rate 18, height '5\' 4"'$  (1.626 m), weight 124.6 kg, last menstrual period 08/17/2012, SpO2 98 %.  Medical Problem List and Plan: 1. 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  -discussed time course of recovery in GBS. Tried to reassure pt that she will see further improvement in sensory function.  1/31- D/c plan to SNF since family cannot care for her  Con't CIR PT, and OT 2.  Antithrombotics: -DVT/anticoagulation:  Pharmaceutical: Lovenox start 1/17             -antiplatelet therapy: none   3. 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 Mood/Behavior/Sleep: LCSW to evaluate and provide emotional support             -antipsychotic agents: n/a   1/24- anxiety is limiting therapy- will add Buspar and Celexa for anxiety  1/28 a little frustrated by ongoing deficits  1/30- thinks need SNF due to deficits- frustrated by this 5. Neuropsych/cognition: This patient is capable of making decisions on her own behalf.   6. Skin/Wound Care: Routine skin care checks   7. Fluids/Electrolytes/Nutrition: Routine Is and Os and follow-up  chemistries             -protein supplements for hypoalbuminemia             -continue folic acid, I95             -  continue MVI   1/18- will check with pharmacy if needs B12 shots AND daily pills.  8: Chronic back pain/radiculopathy:  -continue Dilaudid 4 mg q 6 hours - is her home medicine (PTA per Dr. Alysia [name])             -continue gabapentin 600 mg TID             -continue Lidoderm patch daily             -continue Flexeril 10 mg TID prn   1/26- will d/c Lidoderm- not using.  Otherwise, pain controlled- con't regimen 9: Urinary retention: Foley replaced on 1/15             -treated for UTI with Rocephin for  7 days   1/18- will start Flomax 0.4 mg qsupper and will try and remove Foley in AM- explained will need in/out caths likely before bladder muscle kicks in and she can void.   1/19- foley out this Am at 6am- hasn't voided yet- added bladder scans q6 hours and then cath of volumes /350cc- pt made aware can take a few days to kick in/the flomax. Has voided some and cathed x1-   1/20- no more caths require-d voiding on her own- con't flomax  1/22- d/c bladder scans since voiding well- only required 1 cath since foley removed fyi.   1/23- voiding well- d/c bladder scans 10: Hypertension: monitor TID and prn             -continue Lasix 40 mg daily             -continue Lopressor 50 mg BID             -add magnesium gluconate '250mg'$  HS   1/22- BP controlled- con't regimen  1/29- Lasix 40 mg BID- but home dose 40 mg daily- since Cr 1.26 up from 0.75 last week, will reduce to daily. And recheck Thursday 11: GERD?/GI prophylaxis: continue Protonix   12: B12 deficiency: continue supplementation   1/19- says itches with B12 shots, but just takes benadryl 13: Class 3 obesity: BMI = 48; weight loss counseling   1/24- BMI down to 46.62 14: Hyponatremia, mild: follow-up BMP   15: Elevated serum creatinine; 0.75 1/16 to 1.06 on 1/17             -follow-up BMP   1/18- stable- con't to  monitor weekly and prn  1/29- Cr 1.16- from 0.75- will reduce Lasix 16: GBS: continue to monitor NIFs/VIC q 12 hours             -continue gabapentin   1/19- will increase gabapaentin to 900 mg TID  1/23- slightly better- con't regimen 17: Glaucoma: continue latanoprost eye drops   18: Tobacco use: cessation counseling   19. Constipation  1/18- will con't Senna per pt request- to not increase- but if no BM by tomorrow, will try Sorbitol.   1/31- bowels going well per pt 20.. insomnia  1/20- will start Melatonin 5 mg QHS prn- don't want trazodone since can cause urinary retention  1/22- sleeping a little better 21. Anxiety  1/24- added Buspar 5 mg TID- will also add Celexa for mood/anxiety prevention 20 mg daily. 1/25- no side effects so far.    1/26-27- feels anxiety is do ing MUCH better per pt.  22. R knee buckling/pain  1/29- is getting better- will follow closely.   1/31- pain is back to baseline- ordering a R knee neoprene brace to  help with buckling. Has been measured for it.    LOS: 14 days A FACE TO FACE EVALUATION WAS PERFORMED  Dana [name] 10/16/2022, 9:16 AM

## 2022-10-16 NOTE — Progress Notes (Signed)
Came to do nif and vc, pt not in room

## 2022-10-16 NOTE — Progress Notes (Signed)
NiF -40

## 2022-10-16 NOTE — Progress Notes (Signed)
Occupational Therapy Session Note  Patient Details  Name: Dana [name] MRN: [id number] Date of Birth: 1965/12/30  Today's Date: 10/16/2022 OT Individual Time: 1005-1100 OT Individual Time Calculation (min): 55 min   Short Term Goals: Week 2:  OT Short Term Goal 1 (Week 2): STGs=LTGs due to patient's length of stay.  Skilled Therapeutic Interventions/Progress Updates:  Pt received resting in recliner for skilled OT session with focus on functional transfers and functional reach. Pt agreeable to interventions, demonstrating overall pleasant mood. Pt reported un-rated pain, stating "the brace for my knee is here," time spent donning/adjusting R-knee brace. OT offering intermediate rest breaks and positioning suggestions throughout session to address pain/fatigue and maximize participation/safety in session.   Pt using stedy to complete recliner<>PWC transfer, needing CGA for STS. Pt requiring mod VC for safe maneuvering of PWC, continuing to turn too close around corners and backing into objects when reversing. In day room, pt participates in functional reach activity while in standing with use of stedy. Pt tasked with alternating BUEs to reach/grasp resistive clips attached to backside to simulate functional reach needed for pericare/LB clothing management. Pt continuing to be greatly impacted by decreased sensation in hands and dysmetria, could benefit from toileting aid with grip modification.   Sitting in [place] with back rest completely reclined, pt targets sitting balance/functional reach with use of Squigz/mirror. Pt reaching towards squigz placed below knee level with bilateral hands, to simulate reach for footwear management/LB garment threading. Pt asking for CGA to decrease fear of falling out of chair, successfully completing activity with strong dynamic sitting balance.   Pt remained resting in recliner with all immediate needs met at end of session. Pt continues to be appropriate for  skilled OT intervention to promote further functional independence.   Therapy Documentation Precautions:  Precautions Precautions: Fall Precaution Comments: weakness, numbness L anterior thigh. Restrictions Weight Bearing Restrictions: No   Therapy/Group: Individual Therapy  Maudie Mercury, OTR/L, MSOT  10/16/2022, 5:49 AM

## 2022-10-17 ENCOUNTER — Inpatient Hospital Stay (HOSPITAL_COMMUNITY): Payer: 59

## 2022-10-17 LAB — BASIC METABOLIC PANEL
Anion gap: 8 (ref 5–15)
BUN: 17 mg/dL (ref 6–20)
CO2: 29 mmol/L (ref 22–32)
Calcium: 8.7 mg/dL — ABNORMAL LOW (ref 8.9–10.3)
Chloride: 100 mmol/L (ref 98–111)
Creatinine, Ser: 1.1 mg/dL — ABNORMAL HIGH (ref 0.44–1.00)
GFR, Estimated: 59 mL/min — ABNORMAL LOW (ref >60)
Glucose, Bld: 91 mg/dL (ref 70–99)
Potassium: 4.2 mmol/L (ref 3.5–5.1)
Sodium: 137 mmol/L (ref 135–145)

## 2022-10-17 MED ORDER — BUSPIRONE HCL 10 MG PO TABS
10.0000 mg | ORAL_TABLET | Freq: Three times a day (TID) | ORAL | Status: DC
  Administered 2022-10-17 – 2022-11-06 (×60): 10 mg via ORAL
  Filled 2022-10-17 (×61): qty 1

## 2022-10-17 NOTE — Evaluation (Signed)
Recreational Therapy Assessment and Plan  Patient Details  Name: Dana [name] MRN: [id number] Date of Birth: 1966/06/05 Today's Date: 10/17/2022  Rehab Potential:  Good ELOS:   pt requesting SNF  Assessment Hospital Problem: Principal Problem:   Guillain Barr syndrome [facility])     Past Medical History:      Past Medical History:  Diagnosis Date   Anxiety     Back pain with radiation     Breast mass, right     Chronic pain     Chronic, continuous use of opioids     Complication of anesthesia 04/07/14    Allergic reaction to Lisinopril immediately following surgery   DJD (degenerative joint disease)     Fibromyalgia     Groin abscess     Headache(784.0)     History of IBS     Hypercholesterolemia     IBS (irritable bowel syndrome)     Lactose intolerance     Mild hypertension     Obesity     Tobacco use disorder     Umbilical hernia      Symptomatic    Past Surgical History:       Past Surgical History:  Procedure Laterality Date   ABDOMINAL HYSTERECTOMY       ANTERIOR CERVICAL DECOMP/DISCECTOMY FUSION N/A 12/20/2015    Procedure: ANTERIOR CERVICAL DECOMPRESSION FUSION CERVICAL 4-5, CERVICAL 5-6, CERVICAL 6-7 WITH INSTRUMENTATION AND ALLOGRAFT;  Surgeon: Phylliss Bob, MD;  Location: [place];  Service: Orthopedics;  Laterality: N/A;  Anterior cervical decompression fusion, cervical 4-5, cervical 5-6, cervical 6-7 with instrumentation and allograft   BACK SURGERY       BILATERAL SALPINGECTOMY   09/03/2012    Procedure: BILATERAL SALPINGECTOMY;  Surgeon: [name], MD;  Location: [facility];  Service: Gynecology;  Laterality: Bilateral;   BREAST BIOPSY Right 04/07/2014    Procedure: REMOVAL RIGHT BREAST MASS WITH WIRE LOCALIZATION;  Surgeon: [name], MD;  Location: [place];  Service: General;  Laterality: Right;   BREAST EXCISIONAL BIOPSY Left      x2   BREAST LUMPECTOMY        x2   CARPAL TUNNEL RELEASE Left 05/11/2019    Procedure: LEFT CARPAL TUNNEL RELEASE,  RIGHT TENNIS ELBOW MARCAINE/DEPO MEDROL INJECTION UNDER ANESTHESIA;  Surgeon: Jessy Oto, MD;  Location: [facility];  Service: Orthopedics;  Laterality: Left;   COLONOSCOPY W/ BIOPSIES   04/24/2012    per Dr. Carlean Purl, clear, repeat in 10 yrs    disectomy       ESOPHAGOGASTRODUODENOSCOPY       FINGER SURGERY        Right index-excision of mass    FOOT SURGERY Right      Bone Spurs   LAPAROSCOPIC HYSTERECTOMY   09/03/2012    Procedure: HYSTERECTOMY TOTAL LAPAROSCOPIC;  Surgeon: [name], MD;  Location: [facility];  Service: Gynecology;  Laterality: N/A;   LUMBAR DISC SURGERY       TUBAL LIGATION       UMBILICAL HERNIA REPAIR N/A 05/01/2018    Procedure: UMBILICAL HERNIA REPAIR;  Surgeon: Judeth Horn, MD;  Location: [place];  Service: General;  Laterality: N/A;      Assessment & Plan Clinical Impression: 57 year old female who presented [facility] the [facility] ED on 09/23/2022 with bilateral lower extremity weakness with decreased mobility that has progressed to both arms. She also reported numbness and tingling of the extremities. She was transferred to [facility] for  MRI. 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 Albuminocytologic dissociation on LP under fluoro doen one 1/10. She received IVIG for 2 g/kg daily divided over 5 days. NIF's/VIC every 12 hours continue. Foley catheter placed 1/10 and urine culture obtained positive for E. coli and Klebsiella pneumoniae. She was treated with Rocephin for 7 days. Foley catheter was discontinued, however she developed urinary retention and Foley catheter was placed on 1/15. Home medications continued for chronic back pain/radiculopathy, hypertension, acid reflux, B12 deficiency, glaucoma. She has had improvement in extremity weakness. Last NIF -40 and VC 2.2L performed on 1/14. She is tolerating a regular diet.The patient requires inpatient physical medicine and rehabilitation evaluations and treatment secondary to dysfunction due to Gilliam-Barr  syndrome.    Pt presents with decreased activity tolerance, decreased functional mobility, decreased balance, decreased coordination, feelings of stress Limiting pt's independence with leisure/community pursuits.  Met with pt today to discuss TR services including leisure education, activity analysis/modifications and stress management.  Also discussed the importance of social, emotional, spiritual health in addition to physical health and their effects on overall health and wellness.  Pt stated understanding.   Plan  Min 1 TR session >25 minutes during los  Recommendations for other services: Neuropsych  Discharge Criteria: Patient will be discharged from TR if patient refuses treatment 3 consecutive times without medical reason.  If treatment goals not met, if there is a change in medical status, if patient makes no progress towards goals or if patient is discharged from hospital.  The above assessment, treatment plan, treatment alternatives and goals were discussed and mutually agreed upon: by patient  [place] 10/17/2022, 8:43 AM

## 2022-10-17 NOTE — Progress Notes (Signed)
RT attempted NIF and VC but Pt is not in the room. RT will try again at a later time if schedule permits.

## 2022-10-17 NOTE — Progress Notes (Signed)
Patient ID: Dana [name], female   DOB: 01/19/1966, 57 y.o.   MRN: [id number]  Have faxed ut referral to [facility] and [facility] pt and husband's first choice. Will await response and work on discharge plan.

## 2022-10-17 NOTE — Progress Notes (Signed)
PROGRESS NOTE   Subjective/Complaints:  Pt reports was up all night due to pain in R knee- wasn't able to sleep.   Wondering if has to do therapy today- explained I would like her to do what's possible, but not overdo it.   Got R knee neoprene brace yesterday- thinks "too thin"- but admits she didn't remember knee buckling yesterday? Doesn't think helping.   Pain in R knee- aching/throbbing and runs down R leg to ankle.    ROS:    Pt denies SOB, abd pain, CP, N/V/C/D, and vision changes   Except for HPI Objective:   No results found. No results for input(s): "WBC", "HGB", "HCT", "PLT" in the last 72 hours.   Recent Labs    10/17/22 0600  NA 137  K 4.2  CL 100  CO2 29  GLUCOSE 91  BUN 17  CREATININE 1.10*  CALCIUM 8.7*     Intake/Output Summary (Last 24 hours) at 10/17/2022 0816 Last data filed at 10/17/2022 0740 Gross per 24 hour  Intake 716 ml  Output --  Net 716 ml        Physical Exam: Vital Signs Blood pressure (!) 144/82, pulse 69, temperature 98 F (36.7 C), resp. rate 18, height '5\' 4"'$  (1.626 m), weight 124.6 kg, last menstrual period 08/17/2012, SpO2 100 %.           General: awake, alert, appropriate, very upset about pain/lack of sleep- near tears; Sitting up in bed; NAD HENT: conjugate gaze; oropharynx moist CV: regular rate; no JVD Pulmonary: CTA B/L; no W/R/R- good air movement GI: soft, NT, ND, (+)BS- protuberant Psychiatric: appropriate but near tears over lack of sleep/pain Neurological: Ox3 Extremities; no LE edema B/L  Sensory deficits esp in Ue's  Ext: no clubbing, cyanosis, or edema Psych: pleasant and cooperative  MS: less TTP in R knee- effusion slightly better; B/ hand shaking- due to weakness sin hands/Ue's.  Neuro: Alert and oriented x 3. Normal insight and awareness. Intact Memory. Normal language and speech. Cranial nerve exam unremarkable. Stocking glove sensory  loss BLE L>>R. BUE both hands/wrists. UE strength 4/5 except distal is 4-/5 LE's- HF 2+/5; KE 4-/5; DF 3+ to 4-/5 and PF 4- to 4/5 B/L  Skin: multiple IV's and blood draw tape areas- MS: R knee- still has mild effusion- TTP diffusely.    Assessment/Plan: 1. Functional deficits which require 3+ hours per day of interdisciplinary therapy in a comprehensive inpatient rehab setting. Physiatrist is providing close team supervision and 24 hour management of active medical problems listed below. Physiatrist and rehab team continue to assess barriers to discharge/monitor patient progress toward functional and medical goals  Care Tool:  Bathing    Body parts bathed by patient: Right arm, Left arm, Chest, Abdomen, Front perineal area, Right upper leg, Left upper leg, Face   Body parts bathed by helper: Buttocks, Right lower leg, Left lower leg     Bathing assist Assist Level: Moderate Assistance - Patient 50 - 74%     Upper Body Dressing/Undressing Upper body dressing   What is the patient wearing?: Pull over shirt    Upper body assist Assist Level: Set up assist  Lower Body Dressing/Undressing Lower body dressing      What is the patient wearing?: Underwear/pull up, Pants     Lower body assist Assist for lower body dressing: Maximal Assistance - Patient 25 - 49%     Toileting Toileting    Toileting assist Assist for toileting: 2 Helpers     Transfers Chair/bed transfer  Transfers assist     Chair/bed transfer assist level: Contact Guard/Touching assist (STEDY)     Locomotion Ambulation   Ambulation assist   Ambulation activity did not occur: Safety/medical concerns          Walk 10 feet activity   Assist  Walk 10 feet activity did not occur: Safety/medical concerns        Walk 50 feet activity   Assist Walk 50 feet with 2 turns activity did not occur: Safety/medical concerns         Walk 150 feet activity   Assist Walk 150 feet activity  did not occur: Safety/medical concerns         Walk 10 feet on uneven surface  activity   Assist Walk 10 feet on uneven surfaces activity did not occur: Safety/medical concerns         Wheelchair     Assist Is the patient using a wheelchair?: Yes Type of Wheelchair: Power    Wheelchair assist level: Minimal Assistance - Patient > 75% Max wheelchair distance: 100 ft    Wheelchair 50 feet with 2 turns activity    Assist        Assist Level: Minimal Assistance - Patient > 75%   Wheelchair 150 feet activity     Assist      Assist Level: Maximal Assistance - Patient 25 - 49%   Blood pressure (!) 144/82, pulse 69, temperature 98 F (36.7 C), resp. rate 18, height '5\' 4"'$  (1.626 m), weight 124.6 kg, last menstrual period 08/17/2012, SpO2 100 %.  Medical Problem List and Plan: 1. 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  Antithrombotics: -DVT/anticoagulation:  Pharmaceutical: Lovenox start 1/17             -antiplatelet therapy: none   3. 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 Mood/Behavior/Sleep: LCSW to evaluate and provide emotional support             -antipsychotic agents: n/a   1/24- anxiety is limiting therapy- will add Buspar and Celexa for anxiety  1/28 a little frustrated by ongoing deficits  1/30- thinks need SNF due to deficits- frustrated by this  2/1- will increase Buspar 5. Neuropsych/cognition: This patient is capable of making decisions on her own behalf.   6.  Skin/Wound Care: Routine skin care checks   7. Fluids/Electrolytes/Nutrition: Routine Is and Os and follow-up chemistries             -  protein supplements for hypoalbuminemia             -continue folic acid, Q03             -continue MVI   1/18- will check with pharmacy if needs B12 shots AND daily pills.  8: Chronic back pain/radiculopathy:  -continue Dilaudid 4 mg q 6 hours - is her home medicine (PTA per Dr. [name])             -continue gabapentin 600 mg TID             -continue Lidoderm patch daily             -continue Flexeril 10 mg TID prn   1/26- will d/c Lidoderm- not using.  Otherwise, pain controlled- con't regimen 9: Urinary retention: Foley replaced on 1/15             -treated for UTI with Rocephin for  7 days   1/18- will start Flomax 0.4 mg qsupper and will try and remove Foley in AM- explained will need in/out caths likely before bladder muscle kicks in and she can void.   1/19- foley out this Am at 6am- hasn't voided yet- added bladder scans q6 hours and then cath of volumes /350cc- pt made aware can take a few days to kick in/the flomax. Has voided some and cathed x1-   1/20- no more caths require-d voiding on her own- con't flomax  1/22- d/c bladder scans since voiding well- only required 1 cath since foley removed fyi.   1/23- voiding well- d/c bladder scans 10: Hypertension: monitor TID and prn             -continue Lasix 40 mg daily             -continue Lopressor 50 mg BID             -add magnesium gluconate '250mg'$  HS   1/22- BP controlled- con't regimen  1/29- Lasix 40 mg BID- but home dose 40 mg daily- since Cr 1.26 up from 0.75 last week, will reduce to daily. And recheck Thursday  2/1- new Cr 1.10- and BUN better at 17- from 22- will recheck Monday- sinc eon lower dose of Lasix- no increase in LE edema 11: GERD?/GI prophylaxis: continue Protonix   12: B12 deficiency: continue supplementation   1/19- says itches with B12 shots, but just takes  benadryl 13: Class 3 obesity: BMI = 48; weight loss counseling   1/24- BMI down to 46.62  2/1- BMI 47.15 14: Hyponatremia, mild: follow-up BMP   2/1- Na 137- doing better 15: Elevated serum creatinine; 0.75 1/16 to 1.06 on 1/17             -follow-up BMP   1/18- stable- con't to monitor weekly and prn  1/29- Cr 1.16- from 0.75- will reduce Lasix  2/1- labs today- Cr 1.10- and no LE edema on exam 16: GBS: continue to monitor NIFs/VIC q 12 hours             -continue gabapentin   1/19- will increase gabapaentin to 900 mg TID  1/23- slightly better- con't regimen  2/1- tingling/burning is gone on Gabapentin 17: Glaucoma: continue latanoprost eye drops   18: Tobacco use: cessation counseling   19. Constipation  1/18- will con't Senna per pt request- to not increase- but if no BM by tomorrow, will try Sorbitol.   1/31- bowels going well per pt 20.. insomnia  1/20- will  start Melatonin 5 mg QHS prn- don't want trazodone since can cause urinary retention  1/22- sleeping a little better 21. Anxiety  1/24- added Buspar 5 mg TID- will also add Celexa for mood/anxiety prevention 20 mg daily. 1/25- no side effects so far.    1/26-27- feels anxiety is do ing MUCH better per pt. 2/1- anxiety still limiting therapy per PT/OT- will increase Buspar to 10 mg TID  22. R knee buckling/pain  2/1- got brace- wasn't real effective/"is thin"- I educated pt another type knee brace would be too heavy for her to lift with her weakness- will call ortho to see if any ideas since hurting MORE overnight- R knee- -ever since fall over weekend on R knee directly.    I spent a total of  45   minutes on total care today- >50% coordination of care- due to  Called Ortho regarding R knee- seeing what can do to help pain.    LOS: 15 days A FACE TO FACE EVALUATION WAS PERFORMED  Pawan Knechtel 10/17/2022, 8:16 AM

## 2022-10-17 NOTE — Progress Notes (Signed)
Physical Therapy Session Note  Patient Details  Name: Dana [name] MRN: [id number] Date of Birth: 1966/05/19  Today's Date: 10/17/2022 PT Individual Time: 1000-1100 PT Individual Time Calculation (min): 60 min   Short Term Goals: Week 2:  PT Short Term Goal 1 (Week 2): Pt will require CGA for STS with LRAD PT Short Term Goal 2 (Week 2): Pt will require CGA for standing balance with LRAD PT Short Term Goal 3 (Week 2): Pt will require max A for bed to chair transfer with LRAD  Skilled Therapeutic Interventions/Progress Updates:      Therapy Documentation Precautions:  Precautions Precautions: Fall Precaution Comments: weakness, numbness L anterior thigh. Restrictions Weight Bearing Restrictions: No  Pt received seated in PWC and agreeable to PT session with emphasis on PWC management and mobility as pt reports increased right knee pain, pre-medicated.   PT educated pt on how to operate new loaner PWC controls to adjust seat height, tilt, and speed. Pt requires close (S) for safety with PWC navigation >300 ft throughout session.   Pt navigated obstacle course with PWC and alternated grasping reaching and picking up object to improve B UE coordination and grip strength.   Pt navigated narrow doorframes and spaces including a ramp with PWC with min A for obstacle negotiation.   Pt returned to room and utilized STEDY SBA to transfer to recliner. Pt declined chair alarm and left with all needs in reach.    Therapy/Group: Individual Therapy  Verl Dicker Verl Dicker PT, DPT  10/17/2022, 7:52 AM

## 2022-10-17 NOTE — Progress Notes (Signed)
Physical Therapy Session Note  Patient Details  Name: Dana [name] MRN: [id number] Date of Birth: 06/06/1966  Today's Date: 10/17/2022 PT Individual Time: 2778-2423 PT Individual Time Calculation (min): 26 min  Today's Date: 10/17/2022 PT Missed Time: 19 Minutes Missed Time Reason: [place] Pulling  Short Term Goals: Week 1:  PT Short Term Goal 1 (Week 1): Pt will roll side to side w/ CGA. PT Short Term Goal 1 - Progress (Week 1): Met PT Short Term Goal 2 (Week 1): Pt will transfer sup <> sit w/ min A logroll technique. PT Short Term Goal 2 - Progress (Week 1): Met PT Short Term Goal 3 (Week 1): Pt will transfer sit to stand w/ min A PT Short Term Goal 3 - Progress (Week 1): Met PT Short Term Goal 4 (Week 1): Pt will stand x 5' w/ LRAD (Stedy vs RW). PT Short Term Goal 4 - Progress (Week 1): Met PT Short Term Goal 5 (Week 1): PT to assess gait. PT Short Term Goal 5 - Progress (Week 1): Not progressing Week 2:  PT Short Term Goal 1 (Week 2): Pt will require CGA for STS with LRAD PT Short Term Goal 2 (Week 2): Pt will require CGA for standing balance with LRAD PT Short Term Goal 3 (Week 2): Pt will require max A for bed to chair transfer with LRAD  Skilled Therapeutic Interventions/Progress Updates:    Received pt sitting in power WC and reporting pain "23/10" in R knee (premedicated) - repositioning and distraction done to reduce pain levels. Session with emphasis on functional mobility/transfers and generalized strengthening and endurance. Suggested working on sit<>stands, however pt politely refused due to knee pain but was agreeable to transfer into recliner and work on leg exercises. Pt backed power WC into corner with supervision and stood in [place] (per pt request) with CGA and transferred power WC<>recliner dependently and performed the following exercises with emphasis on LE strength/ROM: -ankle circles x20 clockwise and counterclockwise bilaterally -hip adduction pillow squeezes 10x5  second isometric hold -knee extension 1x4 on RLE (unable to continue due to pain) and 2x10 on LLE -hip flexion 1x5 on RLE (unable to continue to due pain) and 2x10 on LLE Radiology arrived for x-ray of R knee and pt left sitting in recliner with all needs met and RN aware of pt's current status. 19 minutes missed of skilled physical therapy due to x-ray and pain.   Therapy Documentation Precautions:  Precautions Precautions: Fall Precaution Comments: weakness, numbness L anterior thigh. Restrictions Weight Bearing Restrictions: No  Therapy/Group: Individual Therapy Alfonse Alpers PT, DPT  10/17/2022, 7:06 AM

## 2022-10-17 NOTE — Progress Notes (Signed)
Occupational Therapy Session Note  Patient Details  Name: Dana [name] MRN: [id number] Date of Birth: March 03, 1966  Today's Date: 10/17/2022 OT Individual Time: 7035-0093 session 1 OT Individual Time Calculation (min): 79 min  Session 2: 1311-1405   Short Term Goals: Week 2:  OT Short Term Goal 1 (Week 2): STGs=LTGs due to patient's length of stay.  Skilled Therapeutic Interventions/Progress Updates:  Session 1: Pt greeted supine in bed, pt agreeable to OT intervention. Session focus on BADL reeducation, functional mobility, PWC mobility, BUE [place] and decreasing overall caregiver burden.           Pt completed supine>sit with CGA. Donned R knee brace with total A. Pt completed sit>stand in stedy with CGA, total A transport to chair in stedy. Pt able to complete seated grooming tasks from [facility] with MODI. Pt completed w/c mobility down to gym with supervision. Worked on seated BUE [place] with pt instructed to duplicate shape pattern from visual aid provided with a focus on intrinsic strength and improved proprioception.   [place] consultant arrived during session to drop off loaner [place]. Pt completed stedy transfer to new chair as indicated above. Consultant provided education on all drive features with pt able to return demo. Worked on [facility] mobility with pt having increased difficulty navigating toggle switch, applied coband to provide improved grasp. Of note, foot rests are also too long and can not be adjusted per w/c consultant, therefore adapted foot rests to allow pts feet to rest on foot plate.   Remainder of session focused on w/c mobility in hallway and around obstacles with pt having most difficulty backing w/c up in tight spaces, will continue efforts to practice.         Ended session with pt seated in PWC with all needs within reach.   Session 2:  Pt greeted seated in recliner, pt agreeable to OT intervention. Session focus on functional mobility, BUE [place] and increasing overall activity  tolerance for ADL participation.   Utilized stedy to transfer pt to [facility] with pt able to stand to stedy with CGA, total A transfer into stedy. Pt with increased difficulty scooting hips back in [place] needing increased time and effort and use of tilt feature on chair. Pt completed w/c mobility to gym with supervision however pt did have mishap when she veered to far to the R nearly running into side wall. Pt reports she feels as though the toggle switch is different from previous chair however feel her RUE continues to be uncoordinated/ impaired proprioception causing decreased smoothness with w/c mobility.           Once in gym, worked on functional endurance/mobility with pt able to complete x2 sit>stands with bari Rw with MOD A +2. Pt able to stand for 45 secs each trial before needing to sit. Remainder of session focused on seated BUE [facility] tasks. Pt instructed to engage in memory card game where pt instructed to turn over 2 block at a time to determine a match with an emphasis on intrinsic strength, [place] and specifically wrist supination/pronation. Pt completed task with + time having most difficulty with impaired proprioception.             Ended session with pt seated in PWC with all needs within reach.         Therapy Documentation Precautions:  Precautions Precautions: Fall Precaution Comments: weakness, numbness L anterior thigh. Restrictions Weight Bearing Restrictions: No Pain:  [place] 1: unrated pain reported in R knee, rest  breaks provided as needed with knee brace donned.  [place]: unrated pain reported in R knee, rest breaks and repositioning provided as needed.    Therapy/Group: Individual Therapy  Corinne Ports [facility] 10/17/2022, 12:06 PM

## 2022-10-18 DIAGNOSIS — F411 Generalized anxiety disorder: Secondary | ICD-10-CM

## 2022-10-18 MED ORDER — DICLOFENAC SODIUM 25 MG PO TBEC
50.0000 mg | DELAYED_RELEASE_TABLET | Freq: Three times a day (TID) | ORAL | Status: AC
  Administered 2022-10-18 – 2022-10-22 (×15): 50 mg via ORAL
  Filled 2022-10-18 (×15): qty 2

## 2022-10-18 NOTE — Progress Notes (Signed)
PROGRESS NOTE   Subjective/Complaints:  Pt reports R knee brace "helps more than nothing" but "not much".  R knee main limiter Starting being tearful when mentioned kidney function not perfect right now- but she admits hates water and hasn't been drinking much- explained needs to drink 6-8 cups/day.   Willing to try NSAIDs for R knee pain- but knows might increase Cr- but since for 5 days, will improve after takes off it.     ROS:   Pt denies SOB, abd pain, CP, N/V/C/D, and vision changes  Except for HPI Objective:   DG Knee Right Port  Result Date: 10/17/2022 CLINICAL DATA:  Indication states fall. Patient reports atraumatic right knee pain. EXAM: PORTABLE RIGHT KNEE - 1-2 VIEW COMPARISON:  Right knee radiographs 11/09/2020 FINDINGS: Severe patellofemoral joint space narrowing. Severe superior and mild inferior patellar degenerative osteophytosis. Moderate superior trochlear degenerative osteophytosis. No joint effusion. Severe medial compartment joint space narrowing with moderate peripheral medial and lateral compartment degenerative osteophytes. No acute fracture or dislocation. IMPRESSION: Severe patellofemoral and medial compartment osteoarthritis. Electronically Signed   By: Yvonne Kendall M.D.   On: 10/17/2022 15:05   No results for input(s): "WBC", "HGB", "HCT", "PLT" in the last 72 hours.   Recent Labs    10/17/22 0600  NA 137  K 4.2  CL 100  CO2 29  GLUCOSE 91  BUN 17  CREATININE 1.10*  CALCIUM 8.7*     Intake/Output Summary (Last 24 hours) at 10/18/2022 1042 Last data filed at 10/18/2022 0742 Gross per 24 hour  Intake 834 ml  Output --  Net 834 ml        Physical Exam: Vital Signs Blood pressure 133/85, pulse 69, temperature 98 F (36.7 C), temperature source Oral, resp. rate 16, height '5\' 4"'$  (1.626 m), weight 124.6 kg, last menstrual period 08/17/2012, SpO2 100 %.            General:  awake, alert, appropriate, BMI 47.15; NAD HENT: conjugate gaze; oropharynx dry- not drinking CV: regular rate; no JVD Pulmonary: CTA B/L; no W/R/R- good air movement GI: soft, NT, ND, (+)BS Psychiatric: appropriate but tearful at times Neurological: Ox3  Extremities; no LE edema B/L  Sensory deficits esp in Ue's  Ext: no clubbing, cyanosis, or edema Psych: pleasant and cooperative  MS: less TTP in R knee- effusion slightly better; B/ hand shaking- due to weakness sin hands/Ue's.  Neuro: Alert and oriented x 3. Normal insight and awareness. Intact Memory. Normal language and speech. Cranial nerve exam unremarkable. Stocking glove sensory loss BLE L>>R. BUE both hands/wrists. UE strength 4/5 except distal is 4-/5 LE's- HF 2+/5; KE 4-/5; DF 3+ to 4-/5 and PF 4- to 4/5 B/L  Skin: multiple IV's and blood draw tape areas- MS: R knee- still has mild effusion- TTP diffusely.    Assessment/Plan: 1. Functional deficits which require 3+ hours per day of interdisciplinary therapy in a comprehensive inpatient rehab setting. Physiatrist is providing close team supervision and 24 hour management of active medical problems listed below. Physiatrist and rehab team continue to assess barriers to discharge/monitor patient progress toward functional and medical goals  Care Tool:  Bathing  Body parts bathed by patient: Right arm, Left arm, Chest, Abdomen, Front perineal area, Right upper leg, Left upper leg, Face   Body parts bathed by helper: Buttocks, Right lower leg, Left lower leg     Bathing assist Assist Level: Moderate Assistance - Patient 50 - 74%     Upper Body Dressing/Undressing Upper body dressing   What is the patient wearing?: Pull over shirt    Upper body assist Assist Level: Set up assist    Lower Body Dressing/Undressing Lower body dressing      What is the patient wearing?: Underwear/pull up, Pants     Lower body assist Assist for lower body dressing: Maximal  Assistance - Patient 25 - 49%     Toileting Toileting    Toileting assist Assist for toileting: 2 Helpers     Transfers Chair/bed transfer  Transfers assist     Chair/bed transfer assist level: Contact Guard/Touching assist     Locomotion Ambulation   Ambulation assist   Ambulation activity did not occur: Safety/medical concerns          Walk 10 feet activity   Assist  Walk 10 feet activity did not occur: Safety/medical concerns        Walk 50 feet activity   Assist Walk 50 feet with 2 turns activity did not occur: Safety/medical concerns         Walk 150 feet activity   Assist Walk 150 feet activity did not occur: Safety/medical concerns         Walk 10 feet on uneven surface  activity   Assist Walk 10 feet on uneven surfaces activity did not occur: Safety/medical concerns         Wheelchair     Assist Is the patient using a wheelchair?: Yes Type of Wheelchair: Power    Wheelchair assist level: Minimal Assistance - Patient > 75% Max wheelchair distance: 100 ft    Wheelchair 50 feet with 2 turns activity    Assist        Assist Level: Minimal Assistance - Patient > 75%   Wheelchair 150 feet activity     Assist      Assist Level: Maximal Assistance - Patient 25 - 49%   Blood pressure 133/85, pulse 69, temperature 98 F (36.7 C), temperature source Oral, resp. rate 16, height '5\' 4"'$  (1.626 m), weight 124.6 kg, last menstrual period 08/17/2012, SpO2 100 %.  Medical Problem List and Plan: 1. 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  Antithrombotics: -DVT/anticoagulation:  Pharmaceutical: Lovenox start 1/17             -antiplatelet  therapy: none   3. 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  Mood/Behavior/Sleep: LCSW to evaluate and provide emotional support             -antipsychotic agents: n/a   1/24- anxiety is limiting therapy- will add Buspar and Celexa for anxiety  1/28 a little frustrated by ongoing deficits  1/30- thinks need SNF due to deficits- frustrated by this  2/1- will increase Buspar 5. Neuropsych/cognition: This patient is capable of making decisions on her own behalf.   6. Skin/Wound Care: Routine skin care checks   7. Fluids/Electrolytes/Nutrition: Routine Is and Os and follow-up chemistries             -protein supplements for hypoalbuminemia             -continue folic acid, M35             -continue MVI   1/18- will check with pharmacy if needs B12 shots AND daily pills.  8: Chronic back pain/radiculopathy:  -continue Dilaudid 4 mg q 6 hours - is her home medicine (PTA per Dr. [name])             -continue gabapentin 600 mg TID             -continue Lidoderm patch daily             -continue Flexeril 10 mg TID prn   1/26- will d/c Lidoderm- not using.  Otherwise, pain controlled- con't regimen 9: Urinary retention: Foley replaced on 1/15             -treated for UTI with Rocephin for  7 days   1/18- will start Flomax 0.4 mg qsupper and will try and remove Foley in AM- explained will need in/out caths likely before bladder muscle kicks in and she can void.   1/19- foley out this Am at 6am- hasn't voided yet- added bladder scans q6 hours and then cath of volumes /350cc- pt made aware can take a few days to kick in/the flomax. Has voided some and cathed x1-   1/20- no more caths require-d  voiding on her own- con't flomax  1/22- d/c bladder scans since voiding well- only required 1 cath since foley removed fyi.   1/23- voiding well- d/c bladder scans 10: Hypertension: monitor TID and prn             -continue Lasix 40 mg daily             -continue Lopressor 50 mg BID             -add magnesium gluconate '250mg'$  HS   1/22- BP controlled- con't regimen  1/29- Lasix 40 mg BID- but home dose 40 mg daily- since Cr 1.26 up from 0.75 last week, will reduce to daily. And recheck Thursday  2/1- new Cr 1.10- and BUN better at 17- from 22- will recheck Monday- sinc eon lower dose of Lasix- no increase in LE edema  2/2- labs Monday 11: GERD?/GI prophylaxis: continue Protonix   12: B12 deficiency: continue supplementation   1/19- says itches with B12 shots, but just takes benadryl 13: Class 3 obesity: BMI = 48; weight loss counseling   1/24- BMI down to 46.62  2/1- BMI 47.15 14: Hyponatremia, mild: follow-up BMP   2/1- Na 137- doing better 15: Elevated serum creatinine; 0.75 1/16 to 1.06 on 1/17             -follow-up BMP   1/18- stable- con't to monitor weekly and prn  1/29- Cr 1.16- from 0.75- will  reduce Lasix  2/1- labs today- Cr 1.10- and no LE edema on exam  2/2- not drinking- per pt- hates water- encouraged Mio/to add flavor to water and to drink 6-8 cups/day.  16: GBS: continue to monitor NIFs/VIC q 12 hours             -continue gabapentin   1/19- will increase gabapaentin to 900 mg TID  1/23- slightly better- con't regimen  2/1- tingling/burning is gone on Gabapentin 17: Glaucoma: continue latanoprost eye drops   18: Tobacco use: cessation counseling   19. Constipation  1/18- will con't Senna per pt request- to not increase- but if no BM by tomorrow, will try Sorbitol.   2/2 regular BM's per pt 20.. insomnia  1/20- will start Melatonin 5 mg QHS prn- don't want trazodone since can cause urinary retention  1/22- sleeping a little better 21. Anxiety  1/24- added  Buspar 5 mg TID- will also add Celexa for mood/anxiety prevention 20 mg daily. 1/25- no side effects so far.    1/26-27- feels anxiety is do ing MUCH better per pt. 2/1- anxiety still limiting therapy per PT/OT- will increase Buspar to 10 mg TID  22. R knee buckling/pain  2/1- got brace- wasn't real effective/"is thin"- I educated pt another type knee brace would be too heavy for her to lift with her weakness- will call ortho to see if any ideas since hurting MORE overnight- R knee- -ever since fall over weekend on R knee directly.   2/2- Xray just shows severe knee DJD- need for Knee replacement- no fractures- will add Diclofenac 50 mg TID x 5 days to help pain. Since her renal function slightly impaired (not drinking water), will not do longer than 5 days.    I spent a total of  36  minutes on total care today- >50% coordination of care- due to  Independent review of R knee xray and results- also education of pt and nursing about pushing fluids.   LOS: 16 days A FACE TO FACE EVALUATION WAS PERFORMED  Nour Rodrigues 10/18/2022, 10:42 AM

## 2022-10-18 NOTE — Progress Notes (Signed)
Patient ID wristband noted on desktop in room this morning upon entry. Patient states it "breaks her out". Charge nurse notified.

## 2022-10-18 NOTE — Progress Notes (Signed)
  Nutrition Brief Note  RD follow-up from initial visit  Current diet order is Regular, patient is consuming approximately 75-100% of meals at this time. Pt reports appetite remains good. Labs and medications reviewed.   No weight since 1/27; consider weekly weights  Wt Readings from Last 15 Encounters:  10/12/22 124.6 kg  09/28/22 126.9 kg  09/05/22 125.2 kg  05/06/22 132.9 kg  05/01/22 132.9 kg  12/31/21 134.3 kg  12/19/21 (!) 137.7 kg  07/26/21 135.2 kg  05/24/21 135.2 kg  12/20/20 135.2 kg  11/09/20 135.2 kg  10/12/20 135.2 kg  09/21/20 135.2 kg  09/07/20 135.2 kg  05/15/20 128.5 kg    Body mass index is 47.15 kg/m. Patient meets criteria for morbid obesity based on current BMI.   +BM 2/02  Labs and Meds reviewed. Creatinine up, noted MD encouraging proper hydration  No wounds, pressure injuries per RN skin assessment  No nutrition interventions warranted at this time. If nutrition issues arise, please consult RD.   Kerman Passey MS, RDN, LDN, CNSC Registered Dietitian 3 Clinical Nutrition RD Pager and On-Call Pager Number Located in [place]

## 2022-10-18 NOTE — Progress Notes (Signed)
Occupational Therapy Session Note  Patient Details  Name: DESANI SPRUNG MRN: [id number] Date of Birth: 1966/05/09  Today's Date: 10/18/2022 OT Individual Time: 1000-1100 1st session, 1331-1430 2nd session  OT Individual Time Calculation (min): 60 min, 59 min    Short Term Goals: Week 1:  OT Short Term Goal 1 (Week 1): Pt will power up to stand at grab bar or RW with mod A during LB dressing OT Short Term Goal 1 - Progress (Week 1): Met OT Short Term Goal 2 (Week 1): Pt will complete grooming and UB self care routine seated sink side with set up OT Short Term Goal 2 - Progress (Week 1): Met OT Short Term Goal 3 (Week 1): Pt will don LB garments over feet with AE with min a OT Short Term Goal 3 - Progress (Week 1): Progressing toward goal  Skilled Therapeutic Interventions/Progress Updates:  Session 1:  Full toileting, shower and dressing retraining session focus this visit. Pt up in recliner but had not completed any self care this am. Pt reported need for toieting and was transported via STEDY to bathroom and 3 in 1 commode over toilet. Improved overall sit to stand inside STEDY frame and ability to use unilateral hand support on bar and the other to assist with LB pull on pants down and up with mod a. Peri and buttocks hygiene improved for front region min A posterior region max A. Once on TTB in stall shower OT supported feet with towel rolls to add to BOS. Pt able to perform UB bathing with min A and LB with max A. Dressing and grooming back on STEDY with ability to perform 3/8 bra hooks this visit, pullover shirt with set up and LE garment mngt with mod-max A except total A for socks without AD. Left pt recliner level for next therapy and lunch meal. Chair exit set and needs in reach.   Session 2:   Pt up in [place] upon OT arrival. Pt navigated to and from dayroom with min A and cues for chair features. Table top estim conducted R then L distal wrist ext 15 min each side. Meanwhile worked  on resistive clips on hands 25 easy to mid level resistance. Unable to complete mid to heavy or heavy pinch strength clips. Pt reported increased overall ease with self feeding since last week and this task from a few sessions previous. Once back in room, pt requested recliner and OT performed STEDY transfer. Pt left chair level with belt alarm, needs and nurse call button in reach. OT encouraged all UE HEP for carryover this weekend.    Therapy Documentation Precautions:  Precautions Precautions: Fall Precaution Comments: weakness, numbness L anterior thigh. Restrictions Weight Bearing Restrictions: No    Therapy/Group: Individual Therapy  Barnabas Lister 10/18/2022, 7:19 AM

## 2022-10-18 NOTE — Progress Notes (Signed)
Physical Therapy Session Note  Patient Details  Name: Dana [name] MRN: [id number] Date of Birth: 12-03-1965  Today's Date: 10/18/2022 PT Individual Time: 0800-0900 and 1130-1200 PT Individual Time Calculation (min): 60 min and 30 min.  Short Term Goals: Week 2:  PT Short Term Goal 1 (Week 2): Pt will require CGA for STS with LRAD PT Short Term Goal 2 (Week 2): Pt will require CGA for standing balance with LRAD PT Short Term Goal 3 (Week 2): Pt will require max A for bed to chair transfer with LRAD  Skilled Therapeutic Interventions/Progress Updates:   First session:  Pt presents semi-reclined in bed and apologizing for not being ready.  Pt agreeable to do what she can.  Pt performed LE there ex of AP 3 x 15, HS, abd/add (AAROM) and SAQ 3 x 10.  Pt rolled side to side w/ CGA to don shorts.  Pt transferred sup to sit w/ slightly elevated HOB and siderail w/ supervision only, cues for breathing.  Pt scooted to EOB and then transferred sit to stand w/ CGA into Stedy.  Pt remained standing for turn to recliner, x 2' w/ favoring of R knee.  Pt transferred to recliner w/ CGA and cues for slow controlled descent.  Pt preformed seated chest press and lat row 3 x 10-15 using leg of RW w/ good grip L hand.  Pt remained sitting in recliner w/ chair alarm on and all needs in reach.  Second session:  Pt presents sitting in recliner and agreeable to therapy.  Pt transfers sit to stand into Stedy w/ CGA.  Pt stands x 2' w/ supervision.  Pt transferred to [facility].  Pt required min A for scooting back into w/c.  Pt utilized w/c modalities to recline back for completion of scoot to back.  Pt negotiated w/c out of room and through hallways avoiding obstacles around confined spaces in dayroom and obstacle course in hallway avoiding "diamond " tiles.  Pt returned to room and required min A to complete positioning of w/c alongside bed.  Seat belt on and slightly reclined back, all needs in reach.     Therapy  Documentation Precautions:  Precautions Precautions: Fall Precaution Comments: weakness, numbness L anterior thigh. Restrictions Weight Bearing Restrictions: No General:   Vital Signs:   Pain:6/10 Pain Assessment Pain Scale: 0-10 Pain Score: 0-No pain Mobility:     Therapy/Group: Individual Therapy  [name] 10/18/2022, 10:58 AM

## 2022-10-18 NOTE — Progress Notes (Signed)
**  Late Entry**   10/13/22 1200  What Happened  Was fall witnessed? Yes  Who witnessed fall? [place]  Patients activity before fall ambulating-assisted  Point of contact other (comment) (NONE)  Was patient injured? No  Provider Notification  Provider Name/Title Verdis Frederickson, DON  Date Provider Notified 10/13/22  Time Provider Notified 1205  Method of Notification Call  Notification Reason Fall  Provider response Other (Comment)  Date of Provider Response 10/13/22  Time of Provider Response 1205  Adult Fall Risk Assessment  Risk Factor Category (scoring not indicated) Fall has occurred during this admission (document High fall risk)  Age 57  Fall History: Fall within 6 months prior to admission 5  Elimination; Bowel and/or Urine Incontinence 0  Elimination; Bowel and/or Urine Urgency/Frequency 0  Medications: includes PCA/Opiates, Anti-convulsants, Anti-hypertensives, Diuretics, Hypnotics, Laxatives, Sedatives, and Psychotropics 3  Patient Care Equipment 1  Mobility-Assistance 2  Mobility-Gait 2  Mobility-Sensory Deficit 0  Altered awareness of immediate physical environment 0  Impulsiveness 0  Lack of understanding of one's physical/cognitive limitations 0  Total Score 13  Patient Fall Risk Level High fall risk  Adult Fall Risk Interventions  Required Bundle Interventions *See Row Information* High fall risk - low, moderate, and high requirements implemented  Additional Interventions Use of appropriate toileting equipment (bedpan, BSC, etc.)  Fall intervention(s) refused/Patient educated regarding refusal Bed alarm;Nonskid socks  Screening for Fall Injury Risk (To be completed on HIGH fall risk patients) - Assessing Need for Floor Mats  Risk For Fall Injury- Criteria for Floor Mats None identified - No additional interventions needed  Will Implement Floor Mats Yes  Vitals  BP 120/71  BP Location Right Arm  BP Method Automatic  Patient Position (if appropriate)  Sitting  Pulse Rate 63  Pulse Rate Source Monitor  Resp 18  Oxygen Therapy  SpO2 98 %  O2 Device Room Air  Pain Assessment  Pain Scale 0-10  Pain Score 6  Pain Type Acute pain  Pain Location Knee  Pain Orientation Right

## 2022-10-18 NOTE — Progress Notes (Signed)
NIF/VC performed with excellent effort. NIF gauge only goes to -40 cmH2O & she surpassed that easily. VC 2.4L

## 2022-10-19 NOTE — Consult Note (Signed)
Neuropsychological Consultation   Patient:   Dana [name]   DOB:   03/04/1966  MR Number:  [id number]  Location:  [facility] [place] [id number] [place] [address] Dept: [place]: [phone]           Date of Service:   10/18/2022  Start Time:   9 AM End Time:   10 AM  Provider/Observer:  Ilean Skill, Psy.D.       Clinical Neuropsychologist       Billing Code/Service: [phone]  Reason for Service:    Dana [name] is a 57 year old female referred for neuropsychological consultation during her ongoing treatment in the comprehensive inpatient rehabilitation unit.  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  Patient initially presented to [facility] emergency department on 09/23/2022 with bilateral lower extremity weakness with numbness and tingling of extremities.  Patient was subsequently transferred to [facility] for MRI.  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  Patient had previously had ongoing issues with chronic back pain/radiculopathy, hypertension, acid reflux, B12 deficiency and glaucoma.  Due to ongoing motor deficits and somatosensory changes secondary to Ethelene Hal syndrome the patient was admitted to the comprehensive inpatient rehabilitation unit.   Below is the HPI for the current admission for convenience:  HPI: Dana [name] is a 57 year old female who presented to the [facility] ED on 09/23/2022 with bilateral lower extremity weakness with decreased mobility that has progressed to both arms. She also reported numbness and tingling of the extremities. She was transferred to [facility] for MRI. 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 Albuminocytologic dissociation on LP under fluoro doen one 1/10. She received IVIG for 2 g/kg daily divided over 5 days. NIF's/VIC every 12 hours continue. Foley  catheter placed 1/10 and urine culture obtained positive for E. coli and Klebsiella pneumoniae. She was treated with Rocephin for 7 days. Foley catheter was discontinued, however she developed urinary retention and Foley catheter was placed on 1/15. Home medications continued for chronic back pain/radiculopathy, hypertension, acid reflux, B12 deficiency, glaucoma. She has had improvement in extremity weakness. Last NIF -40 and VC 2.2L performed on 1/14. 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  The patient had lots of questions about how this could have developed as she had not suffered any viral illness or had any recent vaccinations as far as she was aware.  We addressed issues regarding Ethelene Hal and the potential for this type of development.  We addressed questions regarding expected change over time and limitations of being able to provide specific answers about her conditions expected course.  Issues related to anxiety and coping were addressed as a primary reason for visit and we worked on coping and adjustment issues around her current status.  Behavioral Observation: Dana [name]  presents as a 57 y.o.-year-old Right handed African American Female who appeared her stated age. her dress was Appropriate and she was Well Groomed and her manners were Appropriate to the situation.  her participation was indicative of Appropriate behaviors.  There were physical disabilities noted.  she displayed an appropriate level of cooperation and motivation.    Interactions:  Active Appropriate and Attentive  Attention:   within normal limits and attention span and concentration were age appropriate  Memory:   within normal  limits; recent and remote memory intact  Visuo-spatial:  not examined  Speech (Volume):  normal  Speech:   normal; normal  Thought Process:  Coherent and Relevant  Though Content:  WNL; not suicidal and not homicidal  Orientation:   person, place, time/date, and situation  Judgment:   Good  Planning:   Fair  Affect:    Anxious  Mood:    Anxious  Insight:   Good  Intelligence:   normal  Medical History:   Past Medical History:  Diagnosis Date   Anxiety    Back pain with radiation    Breast mass, right    Chronic pain    Chronic, continuous use of opioids    Complication of anesthesia 04/07/14   Allergic reaction to Lisinopril immediately following surgery   DJD (degenerative joint disease)    Fibromyalgia    Groin abscess    Headache(784.0)    History of IBS    Hypercholesterolemia    IBS (irritable bowel syndrome)    Lactose intolerance    Mild hypertension    Obesity    Tobacco use disorder    Umbilical hernia    Symptomatic         Patient Active Problem List   Diagnosis Date Noted   Guillain Barr syndrome ([place]) 10/01/2022   UTI (urinary tract infection) 09/24/2022   Hypomagnesemia 09/24/2022   Debility 09/23/2022   Hypokalemia 09/23/2022   B12 deficiency 09/05/2022   Folate deficiency 09/05/2022   Carpal tunnel syndrome, left upper limb 05/11/2019    Class: Chronic   Spinal stenosis of lumbar region with neurogenic claudication 08/20/2018   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   Primary osteoarthritis of right knee 02/27/2017   Right tennis elbow 11/11/2016   Muscle cramps 01/15/2016   Bilateral leg edema 01/08/2016   Radiculopathy 12/20/2015   Hyperglycemia 12/21/2014   Mastodynia, female 11/30/2014   S/P excision of fibroadenoma of breast 11/30/2014   Angioedema of lips 04/07/2014   Fibroadenoma of right breast 03/07/2014   Pelvic pain in female 06/19/2012   Menorrhagia 06/11/2012   SUI (stress urinary incontinence, female)  06/11/2012   HTN (hypertension) 06/11/2012   IBS (irritable bowel syndrome) - diarrhea predominant 03/17/2012   MICROSCOPIC HEMATURIA 11/30/2010   BREAST PAIN, RIGHT 11/30/2010   BREAST MASS, RIGHT 01/12/2010   HYPERCHOLESTEROLEMIA 12/07/2007   CIGARETTE SMOKER 12/07/2007   DEGENERATIVE JOINT DISEASE 12/07/2007   Low back pain with sciatica 12/07/2007   HEADACHE 12/07/2007   Obesity 08/03/2007   Anxiety state 08/03/2007     Psychiatric History:  Patient has a past history of anxiety and chronic pain symptoms and current significant loss of function due to 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  Family Med/Psych History:  Family History  Problem Relation Age of Onset   Diabetes Father    Diabetes Mother    Prostate cancer Paternal Grandfather    Breast cancer Maternal Aunt 15   Impression/DX:  Dana [name] is a 57 year old female referred for neuropsychological consultation during her ongoing treatment in the comprehensive inpatient rehabilitation unit.  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  Patient initially presented to [facility] emergency department on 09/23/2022 with bilateral lower extremity weakness with numbness and tingling of extremities.  Patient was subsequently transferred to [facility] for  MRI.  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  Patient had previously had ongoing issues with chronic back pain/radiculopathy, hypertension, acid reflux, B12 deficiency and glaucoma.  Due to ongoing motor deficits and somatosensory changes secondary to Ethelene Hal syndrome the patient was admitted to the comprehensive inpatient rehabilitation unit.  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  The patient had lots of questions about how this could have developed as she had not suffered any viral illness or  had any recent vaccinations as far as she was aware.  We addressed issues regarding Ethelene Hal and the potential for this type of development.  We addressed questions regarding expected change over time and limitations of being able to provide specific answers about her conditions expected course.  Issues related to anxiety and coping were addressed as a primary reason for visit and we worked on coping and adjustment issues around her current status.  Disposition/Plan:  Today we worked on coping and adjustment issues around patient's current status and ways to cope with limitations for specific expectations and predictions of status changes going forward.  Diagnosis:    Anxiety state/Gilliam Barr syndrome         Electronically Signed   _______________________ Ilean Skill, Psy.D. Clinical Neuropsychologist

## 2022-10-19 NOTE — Progress Notes (Signed)
RT Note:  NIF >= -40 VC 2.04L

## 2022-10-19 NOTE — Progress Notes (Signed)
PROGRESS NOTE   Subjective/Complaints:  No acute complaints. No events overnight.     10/19/2022    1:23 PM 10/19/2022    3:41 AM 10/18/2022    8:07 PM  Vitals with BMI  Systolic [phone]  Diastolic 89 61 60  Pulse 68 68 73     LBM: 2/3, large   ROS: + R knee pain - stable   Denies fevers, chills, N/V, abdominal pain, constipation, diarrhea, SOB, cough, chest pain, new weakness or paraesthesias.     Objective:   No results found. No results for input(s): "WBC", "HGB", "HCT", "PLT" in the last 72 hours.   Recent Labs    10/17/22 0600  NA 137  K 4.2  CL 100  CO2 29  GLUCOSE 91  BUN 17  CREATININE 1.10*  CALCIUM 8.7*      Intake/Output Summary (Last 24 hours) at 10/19/2022 1638 Last data filed at 10/19/2022 1321 Gross per 24 hour  Intake 1320 ml  Output 0 ml  Net 1320 ml         Physical Exam: Vital Signs Blood pressure (!) 157/89, pulse 68, temperature 97.6 F (36.4 C), resp. rate 18, height '5\' 4"'$  (1.626 m), weight 124.6 kg, last menstrual period 08/17/2012, SpO2 100 %.   General: awake, alert, appropriate, BMI 47.15; NAD HENT: conjugate gaze; oropharynx dry- not drinking CV: regular rate; no JVD Pulmonary: CTA B/L; no W/R/R- good air movement GI: soft, NT, ND, (+)BS Psychiatric: appropriate but tearful at times Neurological: Ox3  Extremities; no LE edema B/L. Moving all 4 extremities in bed.  Sensory deficits esp in Ue's  - unchanged Ext: no clubbing, cyanosis, or edema Psych: pleasant and cooperative   Prior exams: MS: less TTP in R knee- effusion slightly better; B/ hand shaking- due to weakness sin hands/Ue's.  Neuro: Alert and oriented x 3. Normal insight and awareness. Intact Memory. Normal language and speech. Cranial nerve exam unremarkable. Stocking glove sensory loss BLE L>>R. BUE both hands/wrists. UE strength 4/5 except distal is 4-/5 LE's- HF 2+/5; KE 4-/5; DF 3+ to 4-/5 and  PF 4- to 4/5 B/L  Skin: multiple IV's and blood draw tape areas- MS: R knee- still has mild effusion- TTP diffusely.    Assessment/Plan: 1. Functional deficits which require 3+ hours per day of interdisciplinary therapy in a comprehensive inpatient rehab setting. Physiatrist is providing close team supervision and 24 hour management of active medical problems listed below. Physiatrist and rehab team continue to assess barriers to discharge/monitor patient progress toward functional and medical goals  Care Tool:  Bathing    Body parts bathed by patient: Right arm, Left arm, Chest, Abdomen, Front perineal area, Right upper leg, Left upper leg, Face   Body parts bathed by helper: Buttocks, Right lower leg, Left lower leg     Bathing assist Assist Level: Moderate Assistance - Patient 50 - 74%     Upper Body Dressing/Undressing Upper body dressing   What is the patient wearing?: Pull over shirt    Upper body assist Assist Level: Set up assist    Lower Body Dressing/Undressing Lower body dressing      What is the  patient wearing?: Underwear/pull up, Pants     Lower body assist Assist for lower body dressing: Maximal Assistance - Patient 25 - 49%     Toileting Toileting    Toileting assist Assist for toileting: 2 Helpers     Transfers Chair/bed transfer  Transfers assist     Chair/bed transfer assist level: Contact Guard/Touching assist     Locomotion Ambulation   Ambulation assist   Ambulation activity did not occur: Safety/medical concerns          Walk 10 feet activity   Assist  Walk 10 feet activity did not occur: Safety/medical concerns        Walk 50 feet activity   Assist Walk 50 feet with 2 turns activity did not occur: Safety/medical concerns         Walk 150 feet activity   Assist Walk 150 feet activity did not occur: Safety/medical concerns         Walk 10 feet on uneven surface  activity   Assist Walk 10 feet on  uneven surfaces activity did not occur: Safety/medical concerns         Wheelchair     Assist Is the patient using a wheelchair?: Yes Type of Wheelchair: Power    Wheelchair assist level: Contact Guard/Touching assist Max wheelchair distance: 150    Wheelchair 50 feet with 2 turns activity    Assist        Assist Level: Contact Guard/Touching assist   Wheelchair 150 feet activity     Assist      Assist Level: Contact Guard/Touching assist   Blood pressure (!) 157/89, pulse 68, temperature 97.6 F (36.4 C), resp. rate 18, height '5\' 4"'$  (1.626 m), weight 124.6 kg, last menstrual period 08/17/2012, SpO2 100 %.  Medical Problem List and Plan: 1. 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  Antithrombotics: -DVT/anticoagulation:  Pharmaceutical: Lovenox start 1/17             -antiplatelet therapy: none   3. 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 Mood/Behavior/Sleep: LCSW to evaluate and provide emotional support             -antipsychotic agents: n/a   1/24- anxiety is limiting therapy- will add Buspar and Celexa for anxiety  1/28 a little frustrated by ongoing deficits  1/30- thinks need SNF due to deficits- frustrated by this  2/1- will increase Buspar  5. Neuropsych/cognition: This patient is capable of making decisions on her own behalf.   6. Skin/Wound Care: Routine skin care  checks   7. Fluids/Electrolytes/Nutrition: Routine Is and Os and follow-up chemistries             -protein supplements for  hypoalbuminemia             -continue folic acid, J03             -continue MVI   1/18- will check with pharmacy if needs B12 shots AND daily pills.   8: Chronic back pain/radiculopathy:  -continue Dilaudid 4 mg q 6 hours - is her home medicine (PTA per Dr. [name])             -continue gabapentin 600 mg TID             -continue Lidoderm patch daily             -continue Flexeril 10 mg TID prn   1/26- will d/c Lidoderm- not using.  Otherwise, pain controlled- con't regimen 9: Urinary retention: Foley replaced on 1/15             -treated for UTI with Rocephin for  7 days   1/18- will start Flomax 0.4 mg qsupper and will try and remove Foley in AM- explained will need in/out caths likely before bladder muscle kicks in and she can void.   1/19- foley out this Am at 6am- hasn't voided yet- added bladder scans q6 hours and then cath of volumes /350cc- pt made aware can take a few days to kick in/the flomax. Has voided some and cathed x1-   1/20- no more caths require-d voiding on her own- con't flomax  1/22- d/c bladder scans since voiding well- only required 1 cath since foley removed fyi.   1/23- voiding well- d/c bladder scans No issues overnight  10: Hypertension: monitor TID and prn             -continue Lasix 40 mg daily             -continue Lopressor 50 mg BID             -add magnesium gluconate '250mg'$  HS   1/22- BP controlled- con't regimen  1/29- Lasix 40 mg BID- but home dose 40 mg daily- since Cr 1.26 up from 0.75 last week, will reduce to daily. And recheck Thursday  2/1- new Cr 1.10- and BUN better at 17- from 22- will recheck Monday- sinc eon lower dose of Lasix- no increase in LE edema  2/2- labs Monday 2/3 - mildly elevated BP; monitor   11: GERD?/GI prophylaxis: continue Protonix   12: B12 deficiency: continue supplementation   1/19-  says itches with B12 shots, but just takes benadryl 13: Class 3 obesity: BMI = 48; weight loss counseling   1/24- BMI down to 46.62  2/1- BMI 47.15 14: Hyponatremia, mild: follow-up BMP   2/1- Na 137- doing better 15: Elevated serum creatinine; 0.75 1/16 to 1.06 on 1/17             -follow-up BMP   1/18- stable- con't to monitor weekly and prn  1/29- Cr 1.16- from 0.75- will reduce Lasix  2/1- labs today- Cr 1.10- and no LE edema on exam  2/2- not drinking- per pt- hates water- encouraged Mio/to add flavor to water and to drink 6-8 cups/day.  16: GBS: continue to monitor NIFs/VIC q 12 hours             -continue gabapentin   1/19- will increase gabapaentin to 900 mg TID  1/23- slightly better- con't regimen  2/1- tingling/burning is gone on Gabapentin 17: Glaucoma: continue latanoprost eye drops   18: Tobacco use: cessation counseling   19.  Constipation  1/18- will con't Senna per pt request- to not increase- but if no BM by tomorrow, will try Sorbitol.   2/2 regular BM's per pt 2/3 - large BM  20.. insomnia  1/20- will start Melatonin 5 mg QHS prn- don't want trazodone since can cause urinary retention  1/22- sleeping a little better 21. Anxiety  1/24- added Buspar 5 mg TID- will also add Celexa for mood/anxiety prevention 20 mg daily. 1/25- no side effects so far.    1/26-27- feels anxiety is do ing MUCH better per pt. 2/1- anxiety still limiting therapy per PT/OT- will increase Buspar to 10 mg TID  22. R knee buckling/pain  2/1- got brace- wasn't real effective/"is thin"- I educated pt another type knee brace would be too heavy for her to lift with her weakness- will call ortho to see if any ideas since hurting MORE overnight- R knee- -ever since fall over weekend on R knee directly.   2/2- Xray just shows severe knee DJD- need for Knee replacement- no fractures- will add Diclofenac 50 mg TID x 5 days to help pain. Since her renal function slightly impaired (not drinking  water), will not do longer than 5 days.   LOS: 17 days A FACE TO [place] 10/19/2022, 4:38 PM

## 2022-10-19 NOTE — Progress Notes (Signed)
NIF -40 VC 1.9L

## 2022-10-19 NOTE — Progress Notes (Signed)
Occupational Therapy Session Note  Patient Details  Name: Dana [name] MRN: [id number] Date of Birth: 03/13/1966  Today's Date: 10/19/2022 OT Individual Time: 1345-1445 OT Individual Time Calculation (min): 60 min    Short Term Goals: Week 1:  OT Short Term Goal 1 (Week 1): Pt will power up to stand at grab bar or RW with mod A during LB dressing OT Short Term Goal 1 - Progress (Week 1): Met OT Short Term Goal 2 (Week 1): Pt will complete grooming and UB self care routine seated sink side with set up OT Short Term Goal 2 - Progress (Week 1): Met OT Short Term Goal 3 (Week 1): Pt will don LB garments over feet with AE with min a OT Short Term Goal 3 - Progress (Week 1): Progressing toward goal Week 2:  OT Short Term Goal 1 (Week 2): STGs=LTGs due to patient's length of stay. Week 3:     Skilled Therapeutic Interventions/Progress Updates:    1:1 Pt received in the recliner. Focus of session was on transfer training and strengthening Lower Legs in prep for ADL tasks. Pt performed transfer to power chair with STEDY with supervision for sit to stands.  In power chair able to drive self down to the gym with supervison with only one close call near the wall on the right side. Also praticed how to mangement opening the room door from the power chair with extra time. In the gym pt able to line self up for transfer with extra time and encouragement. Pt performed slide board transfer with supervision once board was placed (keeping her feet on the foot pedals of the power chair). From the [employer]- focus on sit to stands with proper feet placement and being able to make small adjustments in feet holding on to walker. Then progressed to performing a stand pivot transfer with RW with contact guard with +2 for safety and security- with total cues for sequencing her feet and RW. After resting pt ambulated 13 feet!!!!!!!!!!! With RW with mirror for visual cue for upright posture with close (regular chair/ not  power) chair follow. Then pt transferred from regular chair to power chair with min A with RW again with max cues for sequencing RW and feet- assistance with controlling the walker. Pt so proud of herself. Ice applied to right knee afterwards per her request. Left in room with sister in power chair. Pt did wear right knee support brace in session .   Therapy Documentation Precautions:  Precautions Precautions: Fall Precaution Comments: weakness, numbness L anterior thigh. Restrictions Weight Bearing Restrictions: No  Pain: Pain Assessment Pain Scale: 0-10 Pain Score: 7  Pain Type: Chronic pain Pain Location: Knee Pain Orientation: Right Pain Descriptors / Indicators: Aching Pain Frequency: Constant Pain Onset: On-going Pain Intervention(s): Medication (See eMAR)  applied ice to it as well     Therapy/Group: Individual Therapy  Willeen Cass [facility] 10/19/2022, 3:04 PM

## 2022-10-19 NOTE — Progress Notes (Signed)
Physical Therapy Session Note  Patient Details  Name: Dana [name] MRN: [id number] Date of Birth: [birth year]  Today's Date: 10/19/2022 PT Individual Time: 0905-1004 PT Individual Time Calculation (min): 59 min   Short Term Goals: Week 1:  PT Short Term Goal 1 (Week 1): Pt will roll side to side w/ CGA. PT Short Term Goal 1 - Progress (Week 1): Met PT Short Term Goal 2 (Week 1): Pt will transfer sup <> sit w/ min A logroll technique. PT Short Term Goal 2 - Progress (Week 1): Met PT Short Term Goal 3 (Week 1): Pt will transfer sit to stand w/ min A PT Short Term Goal 3 - Progress (Week 1): Met PT Short Term Goal 4 (Week 1): Pt will stand x 5' w/ LRAD (Stedy vs RW). PT Short Term Goal 4 - Progress (Week 1): Met PT Short Term Goal 5 (Week 1): PT to assess gait. PT Short Term Goal 5 - Progress (Week 1): Not progressing Week 2:  PT Short Term Goal 1 (Week 2): Pt will require CGA for STS with LRAD PT Short Term Goal 2 (Week 2): Pt will require CGA for standing balance with LRAD PT Short Term Goal 3 (Week 2): Pt will require max A for bed to chair transfer with LRAD  Skilled Therapeutic Interventions/Progress Updates:    Pt received sitting in [place] engaging in hygiene at the sink. Pt agreeable to therapy. Pt transported self in [place] to therapy gym w/ supervision assist. Required mod vc to sequence how to reverse in PWC. Pt expressed disapproval of new PWC. Attempts were made at altering foot rest height, however, foot rests were unable to go any higher.   Pt transferred using [facility] Supervision assist throughout the session for sit to stand onto EOM and back to [facility].   Sitting Balance done at [facility] Therapist behind pt for CGA while throwing and catching ball w/ other therapist. 3x30 seconds. Reaching tasks in sitting to challenge the pt's sitting balance and self-confidence in safety when getting outside of midline. Cross body reaching using claw clips. Therapist behind pt for CGA. Pt performed 2x8  reaching tasks using RUE and 2x4 using LUE. Pt given mod encouragement when using LUE to achieve the task.  Standing Balance: pt transferred from sitting at EOM to standing ModA x2 using RW. Pt tolerated standing 3x for ~20 seconds each set. Therapist observed pt standing w/ wt shifted anteriorly on BLE. Pt cued to shift weight backwards to align center of mass. Last set pt instructed to shift weight laterally B, forward and backwards. Observed some fear in pt's reaction. Pt explained that she has numbness in her feet B contributing to her fear when standing.   Pt transported back to room in [facility] w/ supervision, using stedy to get back into recliner chair. Pt remained seated w/ chair alarm on, call bell in reach and all needs met.   Therapy Documentation Precautions:  Precautions Precautions: Fall Precaution Comments: weakness, numbness L anterior thigh. Restrictions Weight Bearing Restrictions: No General:    Pain: denies any pain          Therapy/Group: Individual Therapy  Lissandro Dilorenzo 10/19/2022, 11:36 AM

## 2022-10-20 NOTE — Progress Notes (Signed)
PROGRESS NOTE   Subjective/Complaints:  No events overnight. NIFs remain -40. Feeling overall stronger.  Having issues with new loaner power WC; turns slightly when attempting to back up, trapping her between the bed and dresser in her room for several minutes. She states a custom chair is being made and she just received this one this past week.      10/20/2022    1:37 PM 10/20/2022    4:21 AM 10/19/2022    7:53 PM  Vitals with BMI  Systolic [phone]  Diastolic 85 67 65  Pulse 68 82 71    ROS: Denies fevers, chills, N/V, abdominal pain, constipation, diarrhea, SOB, cough, chest pain, new weakness or paraesthesias.     Objective:   No results found. No results for input(s): "WBC", "HGB", "HCT", "PLT" in the last 72 hours.   No results for input(s): "NA", "K", "CL", "CO2", "GLUCOSE", "BUN", "CREATININE", "CALCIUM" in the last 72 hours.    Intake/Output Summary (Last 24 hours) at 10/20/2022 1929 Last data filed at 10/20/2022 1834 Gross per 24 hour  Intake 588 ml  Output --  Net 588 ml         Physical Exam: Vital Signs Blood pressure 128/85, pulse 68, temperature 97.7 F (36.5 C), temperature source Oral, resp. rate 18, height '5\' 4"'$  (1.626 m), weight 124.6 kg, last menstrual period 08/17/2012, SpO2 100 %.   General: awake, alert, appropriate, BMI 47.15; NAD HENT: conjugate gaze; oropharynx dry- not drinking CV: regular rate; no JVD Pulmonary: CTA B/L; no W/R/R- good air movement GI: soft, NT, ND, (+)BS Psychiatric: appropriate but tearful at times Neurological: Ox3, weakness in BL LEs>UEs, symmetric. Sensory deficits to light touch.   Extremities; no LE edema B/L. Moving all 4 extremities in bed.  Ext: no clubbing, cyanosis, or edema Psych: pleasant and cooperative   Prior exams: MS: less TTP in R knee- effusion slightly better; B/ hand shaking- due to weakness sin hands/Ue's.  Neuro: Alert and oriented x  3. Normal insight and awareness. Intact Memory. Normal language and speech. Cranial nerve exam unremarkable. Stocking glove sensory loss BLE L>>R. BUE both hands/wrists. UE strength 4/5 except distal is 4-/5 LE's- HF 2+/5; KE 4-/5; DF 3+ to 4-/5 and PF 4- to 4/5 B/L  Skin: multiple IV's and blood draw tape areas- MS: R knee- still has mild effusion- TTP diffusely.    Assessment/Plan: 1. Functional deficits which require 3+ hours per day of interdisciplinary therapy in a comprehensive inpatient rehab setting. Physiatrist is providing close team supervision and 24 hour management of active medical problems listed below. Physiatrist and rehab team continue to assess barriers to discharge/monitor patient progress toward functional and medical goals  Care Tool:  Bathing    Body parts bathed by patient: Right arm, Left arm, Chest, Abdomen, Front perineal area, Right upper leg, Left upper leg, Face   Body parts bathed by helper: Buttocks, Right lower leg, Left lower leg     Bathing assist Assist Level: Moderate Assistance - Patient 50 - 74%     Upper Body Dressing/Undressing Upper body dressing   What is the patient wearing?: Pull over shirt    Upper body  assist Assist Level: Set up assist    Lower Body Dressing/Undressing Lower body dressing      What is the patient wearing?: Underwear/pull up, Pants     Lower body assist Assist for lower body dressing: Maximal Assistance - Patient 25 - 49%     Toileting Toileting    Toileting assist Assist for toileting: 2 Helpers     Transfers Chair/bed transfer  Transfers assist     Chair/bed transfer assist level: Contact Guard/Touching assist     Locomotion Ambulation   Ambulation assist   Ambulation activity did not occur: Safety/medical concerns          Walk 10 feet activity   Assist  Walk 10 feet activity did not occur: Safety/medical concerns        Walk 50 feet activity   Assist Walk 50 feet with 2  turns activity did not occur: Safety/medical concerns         Walk 150 feet activity   Assist Walk 150 feet activity did not occur: Safety/medical concerns         Walk 10 feet on uneven surface  activity   Assist Walk 10 feet on uneven surfaces activity did not occur: Safety/medical concerns         Wheelchair     Assist Is the patient using a wheelchair?: Yes Type of Wheelchair: Power    Wheelchair assist level: Contact Guard/Touching assist Max wheelchair distance: 150    Wheelchair 50 feet with 2 turns activity    Assist        Assist Level: Contact Guard/Touching assist   Wheelchair 150 feet activity     Assist      Assist Level: Contact Guard/Touching assist   Blood pressure 128/85, pulse 68, temperature 97.7 F (36.5 C), temperature source Oral, resp. rate 18, height '5\' 4"'$  (1.626 m), weight 124.6 kg, last menstrual period 08/17/2012, SpO2 100 %.  Medical Problem List and Plan: 1. 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 More training with chair so she can get out of tight spaces  2.  Antithrombotics: -DVT/anticoagulation:  Pharmaceutical: Lovenox start 1/17             -antiplatelet therapy: none   3. 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 Mood/Behavior/Sleep: LCSW to evaluate and provide emotional support             -antipsychotic agents: n/a   1/24- anxiety is limiting therapy- will add Buspar and Celexa for anxiety  1/28 a little frustrated by ongoing  deficits  1/30- thinks need SNF due to deficits- frustrated by this  2/1- will increase Buspar  2/4: no issues, continue  5. Neuropsych/cognition: This patient is capable of making decisions on her own behalf.   6. Skin/Wound Care: Routine skin care checks   7. Fluids/Electrolytes/Nutrition: Routine Is and Os and follow-up chemistries             -protein supplements for hypoalbuminemia             -continue folic acid, W10             -continue MVI   1/18- will check with pharmacy if needs B12 shots AND daily pills.   8: Chronic back pain/radiculopathy:  -continue Dilaudid 4 mg q 6 hours - is her home medicine (PTA per Dr. [name])             -continue gabapentin 600 mg TID             -continue Lidoderm patch daily             -continue Flexeril 10 mg TID prn   1/26- will d/c Lidoderm- not using.  Otherwise, pain controlled- con't regimen 9: Urinary retention: Foley replaced on 1/15             -treated for UTI with Rocephin for  7 days   1/18- will start Flomax 0.4 mg qsupper and will try and remove Foley in AM- explained will need in/out caths likely before bladder muscle kicks in and she can void.   1/19- foley out this Am at 6am- hasn't voided yet- added bladder scans q6 hours and then cath of volumes /350cc- pt made aware can take a few days to kick in/the flomax. Has voided some and cathed x1-   1/20- no more caths require-d voiding on her own- con't flomax  1/22- d/c bladder scans since voiding well- only required 1 cath since foley removed fyi.   1/23- voiding well- d/c bladder scans No issues overnight 2/3-4  10: Hypertension: monitor TID and prn             -continue Lasix 40 mg daily             -continue  Lopressor 50 mg BID             -add magnesium gluconate '250mg'$  HS   1/22- BP controlled- con't regimen  1/29- Lasix 40 mg BID- but home dose 40 mg daily- since Cr 1.26 up from 0.75 last week, will reduce to daily. And recheck Thursday  2/1- new Cr 1.10- and BUN better at 17- from 22- will recheck Monday- sinc eon lower dose of Lasix- no increase in LE edema  2/2- labs Monday  normotensive 2/4    10/20/2022    7:30 PM 10/20/2022    1:37 PM 10/20/2022    4:21 AM  Vitals with BMI  Systolic [phone]  Diastolic 55 85 67  Pulse 64 68 82     11: GERD?/GI prophylaxis: continue Protonix   12: B12 deficiency: continue supplementation   1/19- says itches with B12 shots, but just takes benadryl 13: Class 3 obesity: BMI = 48; weight loss counseling   1/24- BMI down to 46.62  2/1- BMI 47.15 14: Hyponatremia, mild: follow-up BMP   2/1- Na 137- doing better 15: Elevated serum creatinine; 0.75 1/16 to 1.06 on 1/17             -follow-up BMP  1/18- stable- con't to monitor weekly and prn  1/29- Cr 1.16- from 0.75- will reduce Lasix  2/1- labs today- Cr 1.10- and no LE edema on exam  2/2- not drinking- per pt- hates water- encouraged Mio/to add flavor to water and to drink 6-8 cups/day.  16: GBS: continue to monitor NIFs/VIC q 12 hours             -continue gabapentin   1/19- will increase gabapaentin to 900 mg TID  1/23- slightly better- con't regimen  2/1- tingling/burning is gone on Gabapentin  2/4: NIFs -40  17: Glaucoma: continue latanoprost eye drops   18: Tobacco use: cessation counseling   19. Constipation  1/18- will con't Senna per pt request- to not increase- but if no BM by tomorrow, will try Sorbitol.   2/2 regular BM's per pt 2/3 - large BM  20.. insomnia  1/20- will start Melatonin 5 mg QHS prn- don't want trazodone since can cause urinary retention  1/22- sleeping a little better 21. Anxiety  1/24- added Buspar 5 mg TID- will also add Celexa for mood/anxiety prevention  20 mg daily. 1/25- no side effects so far.    1/26-27- feels anxiety is do ing MUCH better per pt. 2/1- anxiety still limiting therapy per PT/OT- will increase Buspar to 10 mg TID  22. R knee buckling/pain  2/1- got brace- wasn't real effective/"is thin"- I educated pt another type knee brace would be too heavy for her to lift with her weakness- will call ortho to see if any ideas since hurting MORE overnight- R knee- -ever since fall over weekend on R knee directly.   2/2- Xray just shows severe knee DJD- need for Knee replacement- no fractures- will add Diclofenac 50 mg TID x 5 days to help pain. Since her renal function slightly impaired (not drinking water), will not do longer than 5 days.   LOS: 18 days A FACE TO [place] 10/20/2022, 7:29 PM

## 2022-10-20 NOTE — Progress Notes (Signed)
NIF= >-40 VC= 1.40L Average of three attempts. Sp02=97%

## 2022-10-20 NOTE — Progress Notes (Signed)
Pt achieved a NIF of -40 and VC of 2.26 with good pt effort on all attempts. Pt tolerated well, MD notified, RT will monitor.

## 2022-10-20 NOTE — Progress Notes (Signed)
Occupational Therapy Session Note  Patient Details  Name: Dana [name] MRN: [id number] Date of Birth: [birth year]  Today's Date: 10/20/2022 OT Individual Time: 1120-1201 OT Individual Time Calculation (min): 41 min    Short Term Goals: Week 2:  OT Short Term Goal 1 (Week 2): STGs=LTGs due to patient's length of stay.  Skilled Therapeutic Interventions/Progress Updates:  Pt received sitting in PWC for skilled OT session with focus on functional transfer training. Pt agreeable to interventions, demonstrating overall pleasant mood. Pt with un-rated pain, stating "I walked yesterday!" in reference to functional improvements. OT offering intermediate rest breaks and positioning suggestions throughout session to address pain/fatigue and maximize participation/safety in session.   Pt dependent for footwear/knee-brace donning for time management. Pt drives/maneuvers PWC with Min VC for avoiding obstacles. In day room, pt performs multiple STS transfers requiring Min-CGA + HDRW. Pt then side-steps at [facility], requiring Min A + multimodal cuing to complete task. Pt requires multiple seated rest-breaks stepping towards L-side, but able to complete steps towards R-side in one trial. Pt demonstrating improved control of BLEs with cuing to "look at your feet." Pt and OT discuss carry-over of session skills towards functional transfers such as EOB<>BSC.   Pt performs PWC>recliner transfer with use of stedy and overall CGA.  Pt remained resting in recliner with all immediate needs met at end of session. Pt continues to be appropriate for skilled OT intervention to promote further functional independence.   Therapy Documentation Precautions:  Precautions Precautions: Fall Precaution Comments: weakness, numbness L anterior thigh. Restrictions Weight Bearing Restrictions: No  Therapy/Group: Individual Therapy  Maudie Mercury, OTR/L, MSOT  10/20/2022, 6:32 AM

## 2022-10-21 LAB — CBC
HCT: 33.5 % — ABNORMAL LOW (ref 36.0–46.0)
Hemoglobin: 10 g/dL — ABNORMAL LOW (ref 12.0–15.0)
MCH: 28.8 pg (ref 26.0–34.0)
MCHC: 29.9 g/dL — ABNORMAL LOW (ref 30.0–36.0)
MCV: 96.5 fL (ref 80.0–100.0)
Platelets: 304 10*3/uL (ref 150–400)
RBC: 3.47 MIL/uL — ABNORMAL LOW (ref 3.87–5.11)
RDW: 14.7 % (ref 11.5–15.5)
WBC: 8.6 10*3/uL (ref 4.0–10.5)
nRBC: 0 % (ref 0.0–0.2)

## 2022-10-21 LAB — BASIC METABOLIC PANEL
Anion gap: 8 (ref 5–15)
BUN: 26 mg/dL — ABNORMAL HIGH (ref 6–20)
CO2: 26 mmol/L (ref 22–32)
Calcium: 8.4 mg/dL — ABNORMAL LOW (ref 8.9–10.3)
Chloride: 101 mmol/L (ref 98–111)
Creatinine, Ser: 2.29 mg/dL — ABNORMAL HIGH (ref 0.44–1.00)
GFR, Estimated: 24 mL/min — ABNORMAL LOW (ref >60)
Glucose, Bld: 102 mg/dL — ABNORMAL HIGH (ref 70–99)
Potassium: 4.7 mmol/L (ref 3.5–5.1)
Sodium: 135 mmol/L (ref 135–145)

## 2022-10-21 NOTE — Progress Notes (Addendum)
Occupational Therapy Weekly Progress Note  Patient Details  Name: Dana [name] MRN: [id number] Date of Birth: [birth year]  Beginning of progress report period: October 12, 2022 End of progress report period: October 21, 2022  Today's Date: 10/21/2022 OT Individual Time: 0905-1000 OT Individual Time Calculation (min): 55 min   Today's Date: 10/21/2022 OT Individual Time: 1350-1445 OT Individual Time Calculation (min): 55 min   Patient continuing to make steady progress towards LTGs, progressing to taking steps and completing stand-pivot transfers this reporting period. Pt currently requires Min A for functional transfers, set-up-min A for UB dressing/bathing and Mod-Max A for LB dressing/bathing, dependent on fatigue. At this time, pt's discharge plan continues to be skilled nursing due to levels of physical support needed and decreased caregiver capabilities.   Patient continues to demonstrate the following deficits: muscle weakness, decreased cardiorespiratoy endurance, unbalanced muscle activation and decreased coordination, decreased motor planning, and decreased standing balance, decreased postural control, and decreased balance strategies and therefore will continue to benefit from skilled OT intervention to enhance overall performance with BADL and Reduce care partner burden.  Patient progressing toward long term goals..  Continue plan of care.  OT Short Term Goals Week 3:  OT Short Term Goal 2 (Week 3): STGs=LTGs due to patient's length of stay.  Skilled Therapeutic Interventions/Progress Updates:   Session 1: Pt received resting in [facility] for skilled OT session with focus on functional transfers and mobility. Pt agreeable to interventions, demonstrating overall pleasant mood. Pt with un-rated pain stating "it's okay. . ." OT offering intermediate rest breaks and positioning suggestions throughout session to address pain/fatigue and maximize participation/safety in session.   Pt  requires Max A for donning of tennis shoes, able to pull heel down into shoe to secure fit. Pt then dons sweat-shirt with Min A for material management over shoulders and encouragement for independence.   Pt performs PWC>EOM>recliner stand-pivot transfers with Min A + HDRW, requiring mod VC for sequencing of transfer. Pt then walks room-level distance, ~30 ft, with WC follow and overall Min A + HDRW and cuing for visual compensation strategies.  Pt remained seated in [place] with all immediate needs met at end of session. Pt continues to be appropriate for skilled OT intervention to promote further functional independence.   Session 2: Pt received resting in bed for skilled OT session with focus on light UE strengthening and UE ROM. Pt agreeable to interventions, despite demonstrating overall low mood/affect. Pt with un-rated pain, stating "I just want to go home." OT offering intermediate rest breaks and positioning suggestions throughout session to address pain/fatigue and maximize participation/safety in session.   Pt choosing to participate in session within room. Pt sharing feelings in regards to discharge plan changes, stating ". . . They want me to stay here longer. . . I just want to go home." Pt provided with space to share frustrations, re-educated on CIR vs SNF in terms of therapy opportunities. Pt receptive of education stating "You're right. . [name] stay here and get the help I need."   Pt then participates series of UE strengthening exercises with use of 1# dowel bar: -Chest Press -Overhead Press and Overhead Holds -Bicep Curls -Side to Side Holds -Wrist Bends  Pt performs 2 sets/10 reps of each exercise with multimodal cuing for correct form.   Pt and OT then discuss current toileting needs, positioning changes to increase functional reach, and potential use of toilet aid. Pt shown images of toilet aid options for consideration.  Pt remained resting in recliner with pad alarm  activated and all other immediate needs met at end of session. Pt continues to be appropriate for skilled OT intervention to promote further functional independence.   Therapy Documentation Precautions:  Precautions Precautions: Fall Precaution Comments: weakness, numbness L anterior thigh. Restrictions Weight Bearing Restrictions: No  Therapy/Group: Individual Therapy  Maudie Mercury, OTR/L, MSOT  10/21/2022, 6:25 AM

## 2022-10-21 NOTE — Progress Notes (Signed)
Physical Therapy Weekly Progress Note  Patient Details  Name: Dana [name] MRN: [id number] Date of Birth: Feb 18, 1966  Beginning of progress report period: October 13, 2022 End of progress report period: October 21, 2022   Patient has met 2 of 3 short term goals.  Patient is making steady progress toward long terms goals. Pt demonstrates decreased anxiety and improved confidence with mobility. Pt requires (S) for bed mobility and min A for transfers (sit to stand and stand pivot) with HDRW. Pt requires min A for gait x 30 ft with close w/c follow and frequently limited 2/2 right knee pain. Pt CGA to (S) with [place] navigation. Plan to continue to address mobility deficits to increase functional independence prior to discharge.   Patient continues to demonstrate the following deficits muscle weakness, decreased cardiorespiratoy endurance, impaired timing and sequencing, motor apraxia, ataxia, decreased coordination, and decreased motor planning, decreased motor planning, and decreased standing balance and decreased postural control and therefore will continue to benefit from skilled PT intervention to increase functional independence with mobility.  Patient progressing toward long term goals..  Continue plan of care.  PT Short Term Goals Week 1:  PT Short Term Goal 1 (Week 1): Pt will roll side to side w/ CGA. PT Short Term Goal 1 - Progress (Week 1): Met PT Short Term Goal 2 (Week 1): Pt will transfer sup <> sit w/ min A logroll technique. PT Short Term Goal 2 - Progress (Week 1): Met PT Short Term Goal 3 (Week 1): Pt will transfer sit to stand w/ min A PT Short Term Goal 3 - Progress (Week 1): Met PT Short Term Goal 4 (Week 1): Pt will stand x 5' w/ LRAD (Stedy vs RW). PT Short Term Goal 4 - Progress (Week 1): Met PT Short Term Goal 5 (Week 1): PT to assess gait. PT Short Term Goal 5 - Progress (Week 1): Not progressing Week 2:  PT Short Term Goal 1 (Week 2): Pt will require CGA for STS  with LRAD PT Short Term Goal 1 - Progress (Week 2): Partly met PT Short Term Goal 2 (Week 2): Pt will require CGA for standing balance with LRAD PT Short Term Goal 2 - Progress (Week 2): Met PT Short Term Goal 3 (Week 2): Pt will require max A for bed to chair transfer with LRAD PT Short Term Goal 3 - Progress (Week 2): Met Week 3:  PT Short Term Goal 1 (Week 3): Pt will require CGA for STS PT Short Term Goal 2 (Week 3): Pt will require CGA for stand pivot transfer PT Short Term Goal 3 (Week 3): Pt will require CGA with LRAD x 30 ft  Skilled Therapeutic Interventions/Progress Updates:      Therapy Documentation Precautions:  Precautions Precautions: Fall Precaution Comments: weakness, numbness L anterior thigh. Restrictions Weight Bearing Restrictions: No    Therapy/Group: Individual Therapy  Verl Dicker Verl Dicker PT, DPT  10/21/2022, 12:33 PM

## 2022-10-21 NOTE — Progress Notes (Signed)
Patient ID: Dana [name], female   DOB: 05-28-1966, 57 y.o.   MRN: [id number]  Met with pt and spoke with husband regarding denials from [facility] will need to expand the search. When asked reason was told she is young. [facility] will re-look at her information. Husband had asked to look at [facility] also. Have sent information to them. Husband asked what happens if doesn't get a bed. Informed him will need to expand the search and an offer may be one they do not want. Will continue to look when asked what level pt needs to be at to go home. Pt states: " I need to walk independently." Will continue to work on discharge needs.

## 2022-10-21 NOTE — Progress Notes (Signed)
Physical Therapy Session Note  Patient Details  Name: Dana [name] MRN: [id number] Date of Birth: [birth year]  Today's Date: 10/21/2022 PT Individual Time: 1100-1200 PT Individual Time Calculation (min): 60 min   Short Term Goals: Week 2:  PT Short Term Goal 1 (Week 2): Pt will require CGA for STS with LRAD PT Short Term Goal 2 (Week 2): Pt will require CGA for standing balance with LRAD PT Short Term Goal 3 (Week 2): Pt will require max A for bed to chair transfer with LRAD  Skilled Therapeutic Interventions/Progress Updates:      Therapy Documentation Precautions:  Precautions Precautions: Fall Precaution Comments: weakness, numbness L anterior thigh. Restrictions Weight Bearing Restrictions: No  Pt received seated in PWC at bedside wearing right neoprene sleeve brace. Pt reports 6/10 right knee pain, provided with rest breaks for relief.   Pt navigated PWC with (S) to dayroom to attempt gait training.  Pt performed three sit to stand and gait trials and reported increased knee pain. Pt tried without knee brace as she states it was resulting in numbness and still unable to ambulate. Pt reports fatigue from prior sessions and PT deferred ambulation.   Pt transitioned to UE and trunk control exercises. Pt performed bilateral shoulder flexion with 2.2 # weighted ball 2 x 10. Pt performed D2 pattern with 2.2 # weighted ball with emphasis on truncal rotation 1 x 6 bilaterally.   Pt navigated PWC to room and left seated in recliner at bedside with all needs in reach. At end of session pt reported 10/10 right knee pain and nursing notified and administered pain medication.     Therapy/Group: Individual Therapy  Verl Dicker Verl Dicker PT, DPT  10/21/2022, 7:57 AM

## 2022-10-21 NOTE — Progress Notes (Signed)
RT NOTE:  VC: 1.82 L NIF: -36  Best of three attempts. Good patient effort.

## 2022-10-22 LAB — BASIC METABOLIC PANEL
Anion gap: 12 (ref 5–15)
BUN: 34 mg/dL — ABNORMAL HIGH (ref 6–20)
CO2: 22 mmol/L (ref 22–32)
Calcium: 8.6 mg/dL — ABNORMAL LOW (ref 8.9–10.3)
Chloride: 98 mmol/L (ref 98–111)
Creatinine, Ser: 2.21 mg/dL — ABNORMAL HIGH (ref 0.44–1.00)
GFR, Estimated: 25 mL/min — ABNORMAL LOW (ref >60)
Glucose, Bld: 104 mg/dL — ABNORMAL HIGH (ref 70–99)
Potassium: 5.2 mmol/L — ABNORMAL HIGH (ref 3.5–5.1)
Sodium: 132 mmol/L — ABNORMAL LOW (ref 135–145)

## 2022-10-22 MED ORDER — SODIUM CHLORIDE 0.9 % IV SOLN: INTRAVENOUS | Status: DC

## 2022-10-22 MED ORDER — SODIUM CHLORIDE 0.9 % NICU IV INFUSION SIMPLE
INJECTION | INTRAVENOUS | Status: DC
  Filled 2022-10-22 (×2): qty 500

## 2022-10-22 NOTE — Progress Notes (Signed)
Occupational Therapy Session Note  Patient Details  Name: Dana [name] MRN: [id number] Date of Birth: [birth year]  Today's Date: 10/22/2022 OT Individual Time: 0900-1000 OT Individual Time Calculation (min): 60 min    Short Term Goals: Week 2:  OT Short Term Goal 1 (Week 2): STGs=LTGs due to patient's length of stay. OT Short Term Goal 1 - Progress (Week 2): Progressing toward goal  Skilled Therapeutic Interventions/Progress Updates:   Pt seen for skilled OT session this am. Pt already up and dressed with NT and deferring shower retraining until tomorrow am with OT. Pt in [place] and open to all activity. Pt reports eagerness to learn alternative strategies for peri and buttocks hygiene and saw online toilet aides. OT provided pt with tongs type toilet aide and pt able to demo with r hand enough dexterity to operate. OT will integrate training into tomorrows session. Pt was able to navigate in [facility] with occ min A for dexterity and control of joystick to rehab therapy space gym. OT applied SaeboStim One to L forearm for finger extension and general hand awareness and strength training. Educated on use and grip strength strategies meanwhile working on R UE 2 lb weight training for bicep curls 10 reps x 3 sets with rests between. Once SAEBO removed, pt performed small 1/4" width pinch control on light rings with placement on tall tube in all planes with 85% accuracy Bly. Pt then performed large theraball coordination for UE and trunk for 10 reps forward and laterally x 3 sets with rests. Once back in room, OT hand off to NT for toileting.   Response and parameters to estim:  no adverse skin reactions.  330 pulse width 35 Hz pulse rate On 8 sec/ off 8 sec Ramp up/ down 2 sec Symmetrical Biphasic wave form  Max intensity 129m at 500 Ohm load  Therapy Documentation Precautions:  Precautions Precautions: Fall Precaution Comments: weakness, numbness L anterior thigh. Restrictions Weight  Bearing Restrictions: No    Therapy/Group: Individual Therapy  EBarnabas Lister2/02/2023, 7:33 AM

## 2022-10-22 NOTE — Progress Notes (Signed)
Physical Therapy Session Note  Patient Details  Name: Dana [name] MRN: [id number] Date of Birth: [birth year]  Today's Date: 10/22/2022 PT Individual Time: 1045-1200 PT Individual Time Calculation (min): 75 min   Short Term Goals: Week 3:  PT Short Term Goal 1 (Week 3): Pt will require CGA for STS PT Short Term Goal 2 (Week 3): Pt will require CGA for stand pivot transfer PT Short Term Goal 3 (Week 3): Pt will require CGA with LRAD x 30 ft  Skilled Therapeutic Interventions/Progress Updates:    Pt presents up in power w/c and agreeable to session. Educated on goal of session to progress gait using Lite Gait for off loading to increase distance and focus on quality with decreased fall risk due to knee pain and decreased LE strength. Total assist to don shoes. Pt managing PWC in room and hallway and changing position of chair independently; supervision overall for set-up and requires assist to move foot plates. Donning sling for Lite Gait from seated and then standing position x 2 with +2 assist to help with balance while other person adjusted and tightened sling. Required 2 attempts for standing due to tolerance. Pt initially very anxious about using this device and reports being fearful of falling. Emotional support provided throughout and encouragement. Pt able to perform x 2 trials of 32' and 24' with improved overall cadence, step length, and cues for upright posture through trunk and activation through glutes. Limited mainly by R knee pain requiring standing rest breaks and then ultimately limiting total distance requiring seated break. Knee brace applied for second bout (pt initially declined it). Performed sit > stand from regular chair and transfer with RW for stand step with +2 for safety but overall min to mod assist for actual transfer. Pt able to scoot herself back in w/c with extra time. Performed transfer with Charlaine Dalton from [facility] to recliner in the room with pt performing sit <> stand with  [facility] with supervision. Education provided throughout on overall progress, goals, and continued encouragement. Left up in recliner with ice pack applied and all needs in reach.  Therapy Documentation Precautions:  Precautions Precautions: Fall Precaution Comments: weakness, numbness L anterior thigh. Restrictions Weight Bearing Restrictions: No    Pain:  Initially no complaints of R knee pain but increased with WB during session. Premedicated, rest breaks provided as needed, and ice pack applied at end of session.     Therapy/Group: Individual Therapy  Canary Brim Ivory Broad, PT, DPT, CBIS  10/22/2022, 12:13 PM

## 2022-10-22 NOTE — Progress Notes (Signed)
Physical Therapy Session Note  Patient Details  Name: Dana [name] MRN: [id number] Date of Birth: 1966-05-03  Today's Date: 10/22/2022 PT Individual Time: 1400-1500 PT Individual Time Calculation (min): 60 min   Short Term Goals: Week 3:  PT Short Term Goal 1 (Week 3): Pt will require CGA for STS PT Short Term Goal 2 (Week 3): Pt will require CGA for stand pivot transfer PT Short Term Goal 3 (Week 3): Pt will require CGA with LRAD x 30 ft  Skilled Therapeutic Interventions/Progress Updates:      Therapy Documentation Precautions:  Precautions Precautions: Fall Precaution Comments: weakness, numbness L anterior thigh. Restrictions Weight Bearing Restrictions: No  Pt received seated in recliner at bedside, agreeable to PT session, and first requests assist for toileting.   Pt SBA with sit to stand with stedy from recliner and transferred to toilet. Pt successful with voiding and bowel movement and requires min A for peri-care and lower body dressing.   Pt transferred to sink and performed handwashing with close (s). Pt navigated PWC to main gym with (S) and able to appropriately adjust PWC speed based off environment.   Pt able to throw and catch medium sized ball from rebounder and demonstrates adequate reactive coordination strategies.   Pt utilized tidal weight and performed 1 x 5 shoulder flexion with varying resistance due to shift in water.   Pt navigated PWC to room and left with nursing in transit to assist with dressing.      Therapy/Group: Individual Therapy  Dana [name] Dana [name] PT, DPT  10/22/2022, 9:13 AM

## 2022-10-22 NOTE — Progress Notes (Signed)
PROGRESS NOTE   Subjective/Complaints:  Pt reports walked 23 ft with RW- said "she's to stay longer" but wasn't clear why saying that.  Has a "big hole" dip in bed- asked for new bed? LBM Sunday 2 days ago Feels bloated- like needs to go. Peeing well and says is drinking water "a lot".     ROS:  Pt denies SOB, abd pain, CP, N/V/C/D, and vision changes Except for HPI Objective:   No results found. Recent Labs    10/21/22 0634  WBC 8.6  HGB 10.0*  HCT 33.5*  PLT 304     Recent Labs    10/21/22 0634  NA 135  K 4.7  CL 101  CO2 26  GLUCOSE 102*  BUN 26*  CREATININE 2.29*  CALCIUM 8.4*     Intake/Output Summary (Last 24 hours) at 10/22/2022 0952 Last data filed at 10/22/2022 0843 Gross per 24 hour  Intake 472 ml  Output --  Net 472 ml        Physical Exam: Vital Signs Blood pressure 131/71, pulse 77, temperature 98 F (36.7 C), resp. rate 18, height '5\' 4"'$  (1.626 m), weight 124.6 kg, last menstrual period 08/17/2012, SpO2 98 %.    General: awake, alert, appropriate, slumped in bed somewhat;  NAD HENT: conjugate gaze; oropharynx moist CV: regular rate; no JVD Pulmonary: CTA B/L; no W/R/R- good air movement GI: soft, NT, a little distended; normoactive BS Psychiatric: appropriate- still anxious  but interactive Neurological: Ox3- still weak- UE and LE's B/L  Extremities; no LE edema B/L. Moving all 4 extremities in bed.  Ext: no clubbing, cyanosis, or edema Psych: pleasant and cooperative   Prior exams: MS: less TTP in R knee- effusion slightly better; B/ hand shaking- due to weakness sin hands/Ue's.  Neuro: Alert and oriented x 3. Normal insight and awareness. Intact Memory. Normal language and speech. Cranial nerve exam unremarkable. Stocking glove sensory loss BLE L>>R. BUE both hands/wrists. UE strength 4/5 except distal is 4-/5 LE's- HF 2+/5; KE 4-/5; DF 3+ to 4-/5 and PF 4- to 4/5 B/L   Skin: multiple IV's and blood draw tape areas- MS: R knee- still has mild effusion- TTP diffusely.    Assessment/Plan: 1. Functional deficits which require 3+ hours per day of interdisciplinary therapy in a comprehensive inpatient rehab setting. Physiatrist is providing close team supervision and 24 hour management of active medical problems listed below. Physiatrist and rehab team continue to assess barriers to discharge/monitor patient progress toward functional and medical goals  Care Tool:  Bathing    Body parts bathed by patient: Right arm, Left arm, Chest, Abdomen, Front perineal area, Right upper leg, Left upper leg, Face   Body parts bathed by helper: Buttocks, Right lower leg, Left lower leg     Bathing assist Assist Level: Moderate Assistance - Patient 50 - 74%     Upper Body Dressing/Undressing Upper body dressing   What is the patient wearing?: Pull over shirt    Upper body assist Assist Level: Set up assist    Lower Body Dressing/Undressing Lower body dressing      What is the patient wearing?: Underwear/pull up, Pants  Lower body assist Assist for lower body dressing: Maximal Assistance - Patient 25 - 49%     Toileting Toileting    Toileting assist Assist for toileting: 2 Helpers     Transfers Chair/bed transfer  Transfers assist     Chair/bed transfer assist level: Contact Guard/Touching assist     Locomotion Ambulation   Ambulation assist   Ambulation activity did not occur: Safety/medical concerns          Walk 10 feet activity   Assist  Walk 10 feet activity did not occur: Safety/medical concerns        Walk 50 feet activity   Assist Walk 50 feet with 2 turns activity did not occur: Safety/medical concerns         Walk 150 feet activity   Assist Walk 150 feet activity did not occur: Safety/medical concerns         Walk 10 feet on uneven surface  activity   Assist Walk 10 feet on uneven surfaces  activity did not occur: Safety/medical concerns         Wheelchair     Assist Is the patient using a wheelchair?: Yes Type of Wheelchair: Power    Wheelchair assist level: Contact Guard/Touching assist Max wheelchair distance: 150    Wheelchair 50 feet with 2 turns activity    Assist        Assist Level: Contact Guard/Touching assist   Wheelchair 150 feet activity     Assist      Assist Level: Contact Guard/Touching assist   Blood pressure 131/71, pulse 77, temperature 98 F (36.7 C), resp. rate 18, height '5\' 4"'$  (1.626 m), weight 124.6 kg, last menstrual period 08/17/2012, SpO2 98 %.  Medical Problem List and Plan: 1. 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 More training with chair so she can get out of tight spaces  2/6- sending to SNF- will d/w SW since pt said "here longer".   Con't CIR PT and OT; team conference today to f/u on progress. - walked 23 with therapy with RW 2.  Antithrombotics: -DVT/anticoagulation:  Pharmaceutical: Lovenox start 1/17             -antiplatelet therapy: none   3. 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 Mood/Behavior/Sleep: LCSW to evaluate and provide emotional support              -antipsychotic agents: n/a   1/24- anxiety is limiting therapy- will add Buspar and Celexa for anxiety  1/28 a little frustrated by ongoing deficits  1/30- thinks need SNF due to deficits- frustrated by this  2/1- will increase Buspar   5. Neuropsych/cognition: This patient is capable of making decisions on her own behalf.   6. Skin/Wound Care: Routine skin care checks   7. Fluids/Electrolytes/Nutrition: Routine Is and Os and follow-up chemistries             -protein supplements for hypoalbuminemia             -continue folic acid, G83             -continue MVI   1/18- will check with pharmacy if needs B12 shots AND daily pills.   8: Chronic back pain/radiculopathy:  -continue Dilaudid 4 mg q 6 hours - is her home medicine (PTA per Dr. [name])             -continue gabapentin 600 mg TID             -continue Lidoderm patch daily             -continue Flexeril 10 mg TID prn   1/26- will d/c Lidoderm- not using.  Otherwise, pain controlled- con't regimen 9: Urinary retention: Foley replaced on 1/15             -treated for UTI with Rocephin for  7 days   1/18- will start Flomax 0.4 mg qsupper and will try and remove Foley in AM- explained will need in/out caths likely before bladder muscle kicks in and she can void.   1/19- foley out this Am at 6am- hasn't voided yet- added bladder scans q6 hours and then cath of volumes /350cc- pt made aware can take a few days to kick in/the flomax. Has voided some and cathed x1-   1/20- no more caths require-d voiding on her own- con't flomax  1/22- d/c bladder scans since voiding well- only required 1 cath since foley removed fyi.   1/23- voiding well- d/c bladder scans  2/6- resolved? But Cr up to 2.29- and BUN 26-  10: Hypertension: monitor TID and prn             -continue Lasix 40 mg daily             -continue Lopressor 50 mg BID             -add magnesium gluconate '250mg'$  HS   1/22- BP controlled- con't regimen  1/29- Lasix 40 mg BID-  but home dose 40 mg daily- since Cr 1.26 up from 0.75 last week, will reduce to daily. And recheck Thursday  2/1- new Cr 1.10- and BUN better at 17- from 22- will recheck Monday- sinc eon lower dose of Lasix- no increase in LE edema  2/6- Cr up to 2.29- will recheck today and in AM- says she's drinking- will recheck today and if still elevated, we need IVFs 75cc/hour. If is still elevated, will also stop Lasix for now.     10/21/2022    7:41 PM 10/21/2022    1:52 PM 10/21/2022    4:43 AM  Vitals with BMI  Systolic [phone]  Diastolic 71 90 63  Pulse 77 71 66     11: GERD?/GI prophylaxis: continue Protonix   12: B12 deficiency: continue supplementation   1/19- says itches with B12 shots, but just takes benadryl 13: Class 3 obesity: BMI = 48; weight loss counseling   1/24- BMI down to 46.62  2/1- BMI 47.15 14: Hyponatremia, mild: follow-up BMP  2/1- Na 137- doing better 15: Elevated serum creatinine; 0.75 1/16 to 1.06 on 1/17             -follow-up BMP   1/18- stable- con't to monitor weekly and prn  1/29- Cr 1.16- from 0.75- will reduce Lasix  2/1- labs today- Cr 1.10- and no LE edema on exam  2/2- not drinking- per pt- hates water- encouraged Mio/to add flavor to water and to drink 6-8 cups/day.  16: GBS: continue to monitor NIFs/VIC q 12 hours             -continue gabapentin   1/19- will increase gabapaentin to 900 mg TID  1/23- slightly better- con't regimen  2/1- tingling/burning is gone on Gabapentin  2/4: NIFs -40  17: Glaucoma: continue latanoprost eye drops   18: Tobacco use: cessation counseling   19. Constipation  1/18- will con't Senna per pt request- to not increase- but if no BM by tomorrow, will try Sorbitol.   2/2 regular BM's per pt 2/3 - large BM  2/6- LBM 2 days ago- if no result by tomorrow, will give Sorbitol 20.. insomnia  1/20- will start Melatonin 5 mg QHS prn- don't want trazodone since can cause urinary retention  1/22- sleeping a little  better 21. Anxiety  1/24- added Buspar 5 mg TID- will also add Celexa for mood/anxiety prevention 20 mg daily. 1/25- no side effects so far.    1/26-27- feels anxiety is do ing MUCH better per pt. 2/1- anxiety still limiting therapy per PT/OT- will increase Buspar to 10 mg TID 2/6- will d/w therapy  22. R knee buckling/pain  2/1- got brace- wasn't real effective/"is thin"- I educated pt another type knee brace would be too heavy for her to lift with her weakness- will call ortho to see if any ideas since hurting MORE overnight- R knee- -ever since fall over weekend on R knee directly.   2/2- Xray just shows severe knee DJD- need for Knee replacement- no fractures- will add Diclofenac 50 mg TID x 5 days to help pain. Since her renal function slightly impaired (not drinking water), will not do longer than 5 days.    I spent a total of 53   minutes on total care today- >50% coordination of care- due to  Team conference to f/u on progress and d/w SW about dispo. Also d/w PA about Cr 2.29 and rechecking, f/u and labs in Am-   LOS: 20 days A FACE TO FACE EVALUATION WAS PERFORMED  Jing Howatt 10/22/2022, 9:52 AM

## 2022-10-22 NOTE — Progress Notes (Signed)
RT NOTE:  NIF: >-40 VC: 2.02L  Good patient effort.

## 2022-10-22 NOTE — Patient Care Conference (Signed)
Inpatient RehabilitationTeam Conference and Plan of Care Update Date: 10/22/2022   Time: 11:35 AM    Patient Name: Dana [name]      Medical Record Number: [id number]  Date of Birth: 11/07/1965 Sex: Female         Room/Bed: 4W04C/4W04C-01 Payor Info: Payor: Theme park manager / Plan: [place] / Product Type: *No Product type* /    Admit Date/Time:  10/02/2022  1:50 PM  Primary Diagnosis:  Guillain Barr syndrome [facility])  Hospital Problems: Principal Problem:   Guillain Barr syndrome [facility])    Expected Discharge Date: Expected Discharge Date:  (SNF)  Team Members Present: Physician leading conference: Dr. Courtney Heys Social Worker Present: Ovidio Kin, LCSW Nurse Present: Tacy Learn, RN PT Present: Verl Dicker, PT OT Present: Jamey Ripa, OT     Current Status/Progress Goal Weekly Team Focus  Bowel/Bladder   pt is continent of b/b   Remain continent   Assist with toileting q2-4 hrs and prn    Swallow/Nutrition/ Hydration               ADL's   Progressing to stand-pivots with Min A + HDRW for functional transfers, LB dressing/bathing with Mod-Max A, UB setup   Goals downgraded to directing use of lift equipment and overall Mod A + LRAD   Functional transfers and LB ADLs    Mobility   (S) bed, min STS, stand pivot, gait x 30 ft   min A / CGA  transfer training, gait, pt/fam education, d/c planning    Communication                Safety/Cognition/ Behavioral Observations               Pain   pt c/o generalized pain 10/10 pain score, prn dilaudid given.   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 Pt and husband aware of this   Team Discussion: GBS. Continent B/B. Right knee pain managed with PRN medications. Skin  is CDI. Wears knee brace. Encourage fluids. SP minA with heavy duty RW. LB dressing/bathing mod/maxA. UB setup. May need family meeting.  Patient on target to meet rehab goals: SNF placement  *See Care Plan and progress notes for long and short-term goals.   Revisions to Treatment Plan:  Encourage fluids, monitor labs  Teaching Needs: Medications, safety, self care, skin care, gait/transfer training, etc.   Current Barriers to Discharge: Decreased caregiver support, Home enviroment access/layout, Lack of/limited family support, Insurance for SNF coverage, Weight, Medication compliance, and Behavior  Possible Resolutions to Barriers: Family education, nursing education, facility acceptance.      Medical Summary Current Status: anxiety still limiting; panic attacks in lite gait; LBM 2 days ago; Cr 2.29; BUN 26;  Barriers to Discharge: Behavior/Mood;Morbid Obesity;Renal Insufficiency/Failure;Medical stability;Uncontrolled Pain;Self-care education  Barriers to Discharge Comments: GBS- snf- pt insists to go to specfic SNF's, but will not meet their criteria;  has R knee brace- chronic pain; overeating/a lot of candy at bedside. Possible Resolutions to [employer]: walked 23 ft Min A with RW< but then needed lite gait next day-   Continued Need for Acute Rehabilitation Level of Care: The patient requires daily medical management by a physician with  specialized training in physical medicine and rehabilitation for the following reasons: Direction of a multidisciplinary physical rehabilitation program to maximize functional independence : Yes Medical management of patient stability for increased activity during participation in an intensive rehabilitation regime.: Yes Analysis of laboratory values and/or radiology reports with any subsequent need for medication adjustment and/or medical intervention. : Yes   I attest that I was present, lead the team conference, and concur with the  assessment and plan of the team.   Ernest Pine 10/22/2022, 4:19 PM

## 2022-10-23 LAB — BASIC METABOLIC PANEL
Anion gap: 9 (ref 5–15)
BUN: 37 mg/dL — ABNORMAL HIGH (ref 6–20)
CO2: 25 mmol/L (ref 22–32)
Calcium: 8.4 mg/dL — ABNORMAL LOW (ref 8.9–10.3)
Chloride: 103 mmol/L (ref 98–111)
Creatinine, Ser: 1.82 mg/dL — ABNORMAL HIGH (ref 0.44–1.00)
GFR, Estimated: 32 mL/min — ABNORMAL LOW (ref >60)
Glucose, Bld: 108 mg/dL — ABNORMAL HIGH (ref 70–99)
Potassium: 5.1 mmol/L (ref 3.5–5.1)
Sodium: 137 mmol/L (ref 135–145)

## 2022-10-23 MED ORDER — SODIUM CHLORIDE 0.9 % IV SOLN: INTRAVENOUS | Status: DC

## 2022-10-23 MED ORDER — CITALOPRAM HYDROBROMIDE 20 MG PO TABS
40.0000 mg | ORAL_TABLET | Freq: Every day | ORAL | Status: DC
  Administered 2022-10-24 – 2022-11-06 (×14): 40 mg via ORAL
  Filled 2022-10-23 (×14): qty 2

## 2022-10-23 NOTE — Progress Notes (Signed)
Occupational Therapy Session Note  Patient Details  Name: Dana [name] MRN: [id number] Date of Birth: 1966/07/24  Today's Date: 10/23/2022 OT Individual Time: 0901-1000 1st Session,  [id number]  2nd Session  OT Individual Time Calculation (min): 59 min, 71 min    Short Term Goals: Week 3:  OT Short Term Goal 2 (Week 3): STGs=LTGs due to patient's length of stay.  Skilled Therapeutic Interventions/Progress Updates:   1st Session:  Pt requesting room level therapy this am as she reports "I got this IV for fluids and I just don't feel right". Denied pain throughout session until end with mild R knee pain from use of STEDY to perform standing for ADL's. Nurse assessed temp and no issues. Pt required min A for supine to sit EOB without use of bed features. EOB with close S. STEDY transfer as pt refused RW SPT trial due to how she is feeling this am. Pt performed 5 intervals of 3--4 min up in STEDY in standing with seated rests. Grooming with item manipulation improving to 80% accuracy overall. UB pull over shirt with increased time and effort due to IV in forearm and CGA. Requested back tobed vs being transferred into PWC due to fatigue, LB pull on garments- underwear and elastic waisted pants with reacher to thread over feet at EOB with min A and mod A to pull up over hips rolling from side to side in supine. Left pt bed level as per her request with bed alarm engaged, needs and nurse call button in reach.     2nd Session:  Pt in bed still resting. No report of feeling feverish or ill just fatigued still. Pt agreeable to all in room therapy. OT offered toileting retraining and pt needed to void reporting "these fluids they are pumping me with and I have been drinking a lot of water. OT assisted using STEDY as pt refused SPT this visit reporting the lite gait task yesterday "wore me out". OT encouraged increased independence with wiping/peri hygiene with + results post voiding (see Flowsheets for  data). Stood at toilet for LB garment mngt and then sink level for hand washing up to 4 minutes. Once back bed level, pt completed breathing exercises, red (medium) tband for sh flex/ex and triceps press 2 sets of 10 reps. Pt rested and completed scap protraction/retraction and horizontal abd and add 10 reps x 2 sets. Completed therapy session with SLO-MO ball up and down overhead with focus on graded control 10 reps x 3 sets. Left pt bed level with exit engaged and all needs and nurse call button in reach.      Therapy Documentation Precautions:  Precautions Precautions: Fall Precaution Comments: weakness, numbness L anterior thigh. Restrictions Weight Bearing Restrictions: No General:   Vital Signs: Therapy Vitals Temp: 98.4 F (36.9 C) Pulse Rate: 72 Resp: 16 BP: (!) 112/55 Patient Position (if appropriate): Lying Oxygen Therapy SpO2: 100 % O2 Device: Room Air    Therapy/Group: Individual Therapy  Barnabas Lister 10/23/2022, 7:45 AM

## 2022-10-23 NOTE — Progress Notes (Signed)
With excellent effort. Pt did > -40 NIF VC 2.42 liters

## 2022-10-23 NOTE — Progress Notes (Signed)
PROGRESS NOTE   Subjective/Complaints:  Per nursing eating a lot of junk food.   No issues this AM says she's drinking more water.  LBM last night.   ROS:  Pt denies SOB, abd pain, CP, N/V/C/D, and vision changes  Except for HPI Objective:   No results found. Recent Labs    10/21/22 0634  WBC 8.6  HGB 10.0*  HCT 33.5*  PLT 304     Recent Labs    10/22/22 1048 10/23/22 0725  NA 132* 137  K 5.2* 5.1  CL 98 103  CO2 22 25  GLUCOSE 104* 108*  BUN 34* 37*  CREATININE 2.21* 1.82*  CALCIUM 8.6* 8.4*     Intake/Output Summary (Last 24 hours) at 10/23/2022 0828 Last data filed at 10/23/2022 0724 Gross per 24 hour  Intake 948 ml  Output --  Net 948 ml        Physical Exam: Vital Signs Blood pressure (!) 112/55, pulse 72, temperature 98.4 F (36.9 C), resp. rate 16, height '5\' 4"'$  (1.626 m), weight 124.6 kg, last menstrual period 08/17/2012, SpO2 100 %.     General: awake, alert, appropriate, sitting up in bed getting lab draw done; NAD HENT: conjugate gaze; oropharynx dry CV: regular rate; no JVD Pulmonary: CTA B/L; no W/R/R- good air movement GI: soft, NT, ND, (+)BS- protuberant Psychiatric: appropriate- anxious- but better today Neurological: Ox3  Extremities; no LE edema B/L. Moving all 4 extremities in bed.  Ext: no clubbing, cyanosis, or edema Psych: pleasant and cooperative   Prior exams: MS: less TTP in R knee- effusion slightly better; B/ hand shaking- due to weakness sin hands/Ue's.  Neuro: Alert and oriented x 3. Normal insight and awareness. Intact Memory. Normal language and speech. Cranial nerve exam unremarkable. Stocking glove sensory loss BLE L>>R. BUE both hands/wrists. UE strength 4/5 except distal is 4-/5 LE's- HF 2+/5; KE 4-/5; DF 3+ to 4-/5 and PF 4- to 4/5 B/L  Skin: multiple IV's and blood draw tape areas- MS: R knee- still has mild effusion- TTP diffusely.     Assessment/Plan: 1. Functional deficits which require 3+ hours per day of interdisciplinary therapy in a comprehensive inpatient rehab setting. Physiatrist is providing close team supervision and 24 hour management of active medical problems listed below. Physiatrist and rehab team continue to assess barriers to discharge/monitor patient progress toward functional and medical goals  Care Tool:  Bathing    Body parts bathed by patient: Right arm, Left arm, Chest, Abdomen, Front perineal area, Right upper leg, Left upper leg, Face   Body parts bathed by helper: Buttocks, Right lower leg, Left lower leg     Bathing assist Assist Level: Moderate Assistance - Patient 50 - 74%     Upper Body Dressing/Undressing Upper body dressing   What is the patient wearing?: Pull over shirt    Upper body assist Assist Level: Set up assist    Lower Body Dressing/Undressing Lower body dressing      What is the patient wearing?: Underwear/pull up, Pants     Lower body assist Assist for lower body dressing: Maximal Assistance - Patient 25 - 49%     Toileting Toileting  Toileting assist Assist for toileting: 2 Helpers     Transfers Chair/bed transfer  Transfers assist     Chair/bed transfer assist level: Contact Guard/Touching assist     Locomotion Ambulation   Ambulation assist   Ambulation activity did not occur: Safety/medical concerns  Assist level: 2 helpers Assistive device: Lite Gait Max distance: 32   Walk 10 feet activity   Assist  Walk 10 feet activity did not occur: Safety/medical concerns    Assistive device: Lite Gait   Walk 50 feet activity   Assist Walk 50 feet with 2 turns activity did not occur: Safety/medical concerns         Walk 150 feet activity   Assist Walk 150 feet activity did not occur: Safety/medical concerns         Walk 10 feet on uneven surface  activity   Assist Walk 10 feet on uneven surfaces activity did not  occur: Safety/medical concerns         Wheelchair     Assist Is the patient using a wheelchair?: Yes Type of Wheelchair: Power    Wheelchair assist level: Supervision/Verbal cueing Max wheelchair distance: 150    Wheelchair 50 feet with 2 turns activity    Assist        Assist Level: Supervision/Verbal cueing   Wheelchair 150 feet activity     Assist      Assist Level: Supervision/Verbal cueing   Blood pressure (!) 112/55, pulse 72, temperature 98.4 F (36.9 C), resp. rate 16, height '5\' 4"'$  (1.626 m), weight 124.6 kg, last menstrual period 08/17/2012, SpO2 100 %.  Medical Problem List and Plan: 1. 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 More training with chair so she can get out of tight spaces  2/7- SNF still plan  Con't CIR PT, and OT 2.  Antithrombotics: -DVT/anticoagulation:  Pharmaceutical: Lovenox start 1/17             -antiplatelet therapy: none   3. 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 Mood/Behavior/Sleep: LCSW to evaluate and provide emotional support             -antipsychotic agents: n/a   1/24- anxiety is limiting therapy- will add Buspar and Celexa for anxiety  1/28 a little frustrated by ongoing  deficits  1/30- thinks need SNF due to deficits- frustrated by this  2/1- will increase Buspar  2/7- increased Celexa to 40 mg daily 5. Neuropsych/cognition: This patient is capable of making decisions on her own behalf.   6. Skin/Wound Care: Routine  skin care checks   7. Fluids/Electrolytes/Nutrition: Routine Is and Os and follow-up chemistries             -protein supplements for hypoalbuminemia             -continue folic acid, G18             -continue MVI   1/18- will check with pharmacy if needs B12 shots AND daily pills.   8: Chronic back pain/radiculopathy:  -continue Dilaudid 4 mg q 6 hours - is her home medicine (PTA per Dr. [name])             -continue gabapentin 600 mg TID             -continue Lidoderm patch daily             -continue Flexeril 10 mg TID prn   1/26- will d/c Lidoderm- not using.  Otherwise, pain controlled- con't regimen 9: Urinary retention: Foley replaced on 1/15             -treated for UTI with Rocephin for  7 days   1/18- will start Flomax 0.4 mg qsupper and will try and remove Foley in AM- explained will need in/out caths likely before bladder muscle kicks in and she can void.   1/19- foley out this Am at 6am- hasn't voided yet- added bladder scans q6 hours and then cath of volumes /350cc- pt made aware can take a few days to kick in/the flomax. Has voided some and cathed x1-   1/20- no more caths require-d voiding on her own- con't flomax  1/22- d/c bladder scans since voiding well- only required 1 cath since foley removed fyi.   1/23- voiding well- d/c bladder scans  2/6- resolved? But Cr up to 2.29- and BUN 26-   2/7- off Lasix 10: Hypertension: monitor TID and prn             -continue Lasix 40 mg daily             -continue Lopressor 50 mg BID             -add magnesium gluconate '250mg'$  HS   1/22- BP controlled- con't regimen  1/29- Lasix 40 mg BID- but home dose 40 mg daily- since Cr 1.26 up from 0.75 last week, will reduce to daily.  And recheck Thursday  2/1- new Cr 1.10- and BUN better at 17- from 22- will recheck Monday- sinc eon lower dose of Lasix- no increase in LE edema  2/6- Cr up to 2.29- will recheck today and in AM- says she's drinking- will recheck today and if still elevated, we need IVFs 75cc/hour. If is still elevated, will also stop Lasix for now.   2/7- down to 1.82- will do IVF's another 24 hours and recheck in AM    10/23/2022    6:15 AM 10/22/2022    7:50 PM 10/22/2022    3:23 PM  Vitals with BMI  Systolic [phone]  Diastolic 55 64 70  Pulse 72 79 70     11: GERD?/GI prophylaxis: continue Protonix   12: B12 deficiency: continue supplementation   1/19- says itches with B12 shots, but just takes benadryl 13: Class 3 obesity: BMI = 48; weight loss counseling   1/24- BMI down to 46.62  2/1- BMI 47.15  2/7- pt eating a lot of junk food per staff- 1 lb M&M's eaten in 2 days; having a lot of fast food  brought by husband 14: Hyponatremia, mild: follow-up BMP   2/1- Na 137- doing better 15: Elevated serum creatinine/AKI; 0.75 1/16 to 1.06 on 1/17             -follow-up BMP   1/18- stable- con't to monitor weekly and prn  1/29- Cr 1.16- from 0.75- will reduce Lasix  2/1- labs today- Cr 1.10- and no LE edema on exam  2/2- not drinking- per pt- hates water- encouraged Mio/to add flavor to water and to drink 6-8 cups/day.   2/7- Cr 1.82 down from 2.29 and BUN 37- off Lasix as of this AM- will give another 24 hours of IVFs at 75cc/hour and recheck in AM 16: GBS: continue to monitor NIFs/VIC q 12 hours             -continue gabapentin   1/19- will increase gabapaentin to 900 mg TID  1/23- slightly better- con't regimen  2/1- tingling/burning is gone on Gabapentin  2/4: NIFs -40   2/7- NIF's stable at -40 17: Glaucoma: continue latanoprost eye drops   18: Tobacco use: cessation counseling   19. Constipation  1/18- will con't Senna per pt request- to not increase- but if no BM by tomorrow, will try  Sorbitol.   2/2 regular BM's per pt 2/3 - large BM  2/6- LBM 2 days ago- if no result by tomorrow, will give Sorbitol 20.. insomnia  1/20- will start Melatonin 5 mg QHS prn- don't want trazodone since can cause urinary retention  1/22- sleeping a little better 21. Anxiety  1/24- added Buspar 5 mg TID- will also add Celexa for mood/anxiety prevention 20 mg daily. 1/25- no side effects so far.    1/26-27- feels anxiety is do ing MUCH better per pt. 2/1- anxiety still limiting therapy per PT/OT- will increase Buspar to 10 mg TID 2/7- pt says anxiety still limiting- so does therapy- will increase Celexa to 40 mg daily.  22. R knee buckling/pain  2/1- got brace- wasn't real effective/"is thin"- I educated pt another type knee brace would be too heavy for her to lift with her weakness- will call ortho to see if any ideas since hurting MORE overnight- R knee- -ever since fall over weekend on R knee directly.   2/2- Xray just shows severe knee DJD- need for Knee replacement- no fractures- will add Diclofenac 50 mg TID x 5 days to help pain. Since her renal function slightly impaired (not drinking water), will not do longer than 5 days.  23. Hyperkalemia  2/7- K+ down to 5.1- if still elevated tomorrow, then will use Lokelma  I spent a total of  36  minutes on total care today- >50% coordination of care- due to  Due to d/w nursing- pt having large amount of junk food in room; also review of labs and making decisions on fluid/Cr/AKI, etc  LOS: 21 days A FACE TO FACE EVALUATION WAS PERFORMED  Saylor Murry 10/23/2022, 8:28 AM

## 2022-10-23 NOTE — Progress Notes (Signed)
Physical Therapy Session Note  Patient Details  Name: Dana [name] MRN: [id number] Date of Birth: 1966/03/25  Today's Date: 10/23/2022 PT Individual Time: 1100-1208 PT Individual Time Calculation (min): 68 min   Short Term Goals: Week 3:  PT Short Term Goal 1 (Week 3): Pt will require CGA for STS PT Short Term Goal 2 (Week 3): Pt will require CGA for stand pivot transfer PT Short Term Goal 3 (Week 3): Pt will require CGA with LRAD x 30 ft  Skilled Therapeutic Interventions/Progress Updates:      Therapy Documentation Precautions:  Precautions Precautions: Fall Precaution Comments: weakness, numbness L anterior thigh. Restrictions Weight Bearing Restrictions: No  Pt agreeable to bed level PT session as she reports fatigue and generalized soreness. Pt also prefers to stay in room due to IV.   Pt requires increased time and supervision for safety with supine to sit with use of bed features.   PT session with emphasis on fine and gross UE motor tasks. Pt requires supervision for dynamic seated exercises at EOB and sorted playing cards to address L hand and finger coordination deficits and pincer grip.   Pt transitioned to performing elbow supination/pronation with stacking and flipping cones with L UE to address coordination and motor planning. Pt performed 3 x 10 bilateral wrist extension and flexion with 1# dumb bell to address UE strength deficits.   Pt left semi-reclined in bed with all needs in reach and alarm on.      Therapy/Group: Individual Therapy  Verl Dicker Verl Dicker PT, DPT  10/23/2022, 7:40 AM

## 2022-10-23 NOTE — Progress Notes (Signed)
Patient ID: Dana [name], female   DOB: 04-08-1966, 57 y.o.   MRN: [id number]  Awaiting [facility] to get back with worker regarding ability to take pt. Having RN look over clinicals and will get back with this worker.

## 2022-10-24 LAB — BASIC METABOLIC PANEL
Anion gap: 9 (ref 5–15)
BUN: 27 mg/dL — ABNORMAL HIGH (ref 6–20)
CO2: 24 mmol/L (ref 22–32)
Calcium: 8.5 mg/dL — ABNORMAL LOW (ref 8.9–10.3)
Chloride: 105 mmol/L (ref 98–111)
Creatinine, Ser: 1.2 mg/dL — ABNORMAL HIGH (ref 0.44–1.00)
GFR, Estimated: 53 mL/min — ABNORMAL LOW (ref >60)
Glucose, Bld: 100 mg/dL — ABNORMAL HIGH (ref 70–99)
Potassium: 4.9 mmol/L (ref 3.5–5.1)
Sodium: 138 mmol/L (ref 135–145)

## 2022-10-24 NOTE — Progress Notes (Signed)
Recreational Therapy Session Note  Patient Details  Name: Dana [name] MRN: [id number] Date of Birth: 1965/12/23 Today's Date: 10/24/2022  Pain: c/o low back pain, unrated  Skilled Therapeutic Interventions/Progress Updates: met with pt briefly today to discuss emotional health.  Pt stated feeling depressed due to continued pain, limited mobility and difficulties with discharge planning.  Discussed general placement procedures sharing that facilities have to have a bed available that will meet her care needs and need for insurance approval at those facilities.  Pt states she is "not a lost cause." Emotional support provided.  Reviewed aspects of wellness and factors that contribute to stress.  Pt stated appreciation for this visit.   [place] 10/24/2022, 3:19 PM

## 2022-10-24 NOTE — Progress Notes (Signed)
PROGRESS NOTE   Subjective/Complaints:  Pt reports doesn't want lidoderm for back- makes her itch- but willing to do [place].  Low back really bothering her since last night.  Took pain meds already.   Drinking more water per pt.  Cr 1.2  down from 1.8 and BUN 27- down from 37  ROS:  Pt denies SOB, abd pain, CP, N/V/C/D, and vision changes  Except for HPI Objective:   No results found. No results for input(s): "WBC", "HGB", "HCT", "PLT" in the last 72 hours.    Recent Labs    10/23/22 0725 10/24/22 0517  NA 137 138  K 5.1 4.9  CL 103 105  CO2 25 24  GLUCOSE 108* 100*  BUN 37* 27*  CREATININE 1.82* 1.20*  CALCIUM 8.4* 8.5*     Intake/Output Summary (Last 24 hours) at 10/24/2022 0857 Last data filed at 10/23/2022 2220 Gross per 24 hour  Intake 346 ml  Output --  Net 346 ml        Physical Exam: Vital Signs Blood pressure (!) 156/73, pulse 65, temperature 98.7 F (37.1 C), resp. rate 16, height '5\' 4"'$  (1.626 m), weight 124.6 kg, last menstrual period 08/17/2012, SpO2 100 %.      General: awake, alert, appropriate, sitting up in bed; has water and nothing else/drink wise at bedside;  NAD HENT: conjugate gaze; oropharynx less dry CV: regular rate and rhythm; no JVD Pulmonary: CTA B/L; no W/R/R- good air movement GI: soft, NT, ND, (+)BS- protuberant Psychiatric: appropriate Neurological: Ox3  Extremities: Extremities; no LE edema  Prior exams: MS: less TTP in R knee- effusion slightly better; B/ hand shaking- due to weakness sin hands/Ue's.  Neuro: Alert and oriented x 3. Normal insight and awareness. Intact Memory. Normal language and speech. Cranial nerve exam unremarkable. Stocking glove sensory loss BLE L>>R. BUE both hands/wrists. UE strength 4/5 except distal is 4-/5 LE's- HF 2+/5; KE 4-/5; DF 3+ to 4-/5 and PF 4- to 4/5 B/L  Skin: multiple IV's and blood draw tape areas- MS: R knee- still has  mild effusion- TTP diffusely.    Assessment/Plan: 1. Functional deficits which require 3+ hours per day of interdisciplinary therapy in a comprehensive inpatient rehab setting. Physiatrist is providing close team supervision and 24 hour management of active medical problems listed below. Physiatrist and rehab team continue to assess barriers to discharge/monitor patient progress toward functional and medical goals  Care Tool:  Bathing    Body parts bathed by patient: Right arm, Left arm, Chest, Abdomen, Front perineal area, Right upper leg, Left upper leg, Face   Body parts bathed by helper: Buttocks, Right lower leg, Left lower leg     Bathing assist Assist Level: Moderate Assistance - Patient 50 - 74%     Upper Body Dressing/Undressing Upper body dressing   What is the patient wearing?: Pull over shirt    Upper body assist Assist Level: Set up assist    Lower Body Dressing/Undressing Lower body dressing      What is the patient wearing?: Underwear/pull up, Pants     Lower body assist Assist for lower body dressing: Maximal Assistance - Patient 25 - 49%  Toileting Toileting    Toileting assist Assist for toileting: 2 Helpers     Transfers Chair/bed transfer  Transfers assist     Chair/bed transfer assist level: Contact Guard/Touching assist     Locomotion Ambulation   Ambulation assist   Ambulation activity did not occur: Safety/medical concerns  Assist level: 2 helpers Assistive device: Lite Gait Max distance: 32   Walk 10 feet activity   Assist  Walk 10 feet activity did not occur: Safety/medical concerns    Assistive device: Lite Gait   Walk 50 feet activity   Assist Walk 50 feet with 2 turns activity did not occur: Safety/medical concerns         Walk 150 feet activity   Assist Walk 150 feet activity did not occur: Safety/medical concerns         Walk 10 feet on uneven surface  activity   Assist Walk 10 feet on  uneven surfaces activity did not occur: Safety/medical concerns         Wheelchair     Assist Is the patient using a wheelchair?: Yes Type of Wheelchair: Power    Wheelchair assist level: Supervision/Verbal cueing Max wheelchair distance: 150    Wheelchair 50 feet with 2 turns activity    Assist        Assist Level: Supervision/Verbal cueing   Wheelchair 150 feet activity     Assist      Assist Level: Supervision/Verbal cueing   Blood pressure (!) 156/73, pulse 65, temperature 98.7 F (37.1 C), resp. rate 16, height '5\' 4"'$  (1.626 m), weight 124.6 kg, last menstrual period 08/17/2012, SpO2 100 %.  Medical Problem List and Plan: 1. 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 More training with chair so she can get out of tight spaces  SNF still plan  Con't CIR PT and OT 2.  Antithrombotics: -DVT/anticoagulation:  Pharmaceutical: Lovenox start 1/17             -antiplatelet therapy: none   3. 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 Mood/Behavior/Sleep: LCSW to evaluate and provide emotional support             -antipsychotic agents: n/a   1/24- anxiety is limiting therapy- will add  Buspar and Celexa for anxiety  1/28 a little frustrated by ongoing deficits  1/30- thinks need SNF due to deficits- frustrated by this  2/1- will increase Buspar  2/7- increased Celexa to 40 mg daily 5. Neuropsych/cognition: This patient is capable of  making decisions on her own behalf.   6. Skin/Wound Care: Routine skin care checks   7. Fluids/Electrolytes/Nutrition: Routine Is and Os and follow-up chemistries             -protein supplements for hypoalbuminemia             -continue folic acid, O83             -continue MVI   1/18- will check with pharmacy if needs B12 shots AND daily pills.   8: Chronic back pain/radiculopathy:  -continue Dilaudid 4 mg q 6 hours - is her home medicine (PTA per Dr. [name])             -continue gabapentin 600 mg TID             -continue Lidoderm patch daily             -continue Flexeril 10 mg TID prn   1/26- will d/c Lidoderm- not using.  Otherwise, pain controlled- con't regimen 9: Urinary retention: Foley replaced on 1/15             -treated for UTI with Rocephin for  7 days   1/18- will start Flomax 0.4 mg qsupper and will try and remove Foley in AM- explained will need in/out caths likely before bladder muscle kicks in and she can void.   1/19- foley out this Am at 6am- hasn't voided yet- added bladder scans q6 hours and then cath of volumes /350cc- pt made aware can take a few days to kick in/the flomax. Has voided some and cathed x1-   1/20- no more caths require-d voiding on her own- con't flomax  1/22- d/c bladder scans since voiding well- only required 1 cath since foley removed fyi.   1/23- voiding well- d/c bladder scans  2/6- resolved? But Cr up to 2.29- and BUN 26-   2/7- off Lasix  2/8- Cr 1.21 and Bun 27- sotpped IVFs 10: Hypertension: monitor TID and prn             -continue Lasix 40 mg daily             -continue Lopressor 50 mg BID             -add magnesium gluconate '250mg'$  HS   1/22- BP controlled- con't  regimen  1/29- Lasix 40 mg BID- but home dose 40 mg daily- since Cr 1.26 up from 0.75 last week, will reduce to daily. And recheck Thursday  2/1- new Cr 1.10- and BUN better at 17- from 22- will recheck Monday- sinc eon lower dose of Lasix- no increase in LE edema  2/6- Cr up to 2.29- will recheck today and in AM- says she's drinking- will recheck today and if still elevated, we need IVFs 75cc/hour. If is still elevated, will also stop Lasix for now.   2/7- down to 1.82- will do IVF's another 24 hours and recheck in AM  2/8- will stop IVFs- off Lasix- no LE edema- needs weight- will order    10/24/2022    6:13 AM 10/23/2022    8:54 PM 10/23/2022    1:54 PM  Vitals with BMI  Systolic [phone]  Diastolic 73 70 60  Pulse 65 74 76     11: GERD?/GI prophylaxis: continue Protonix   12: B12 deficiency: continue supplementation   1/19- says itches with B12 shots, but just takes benadryl 13: Class 3 obesity: BMI = 48; weight loss counseling  1/24- BMI down to 46.62  2/1- BMI 47.15  2/7- pt eating a lot of junk food per staff- 1 lb M&M's eaten in 2 days; having a lot of fast food brought by husband 14: Hyponatremia, mild: follow-up BMP   2/1- Na 137- doing better 15: Elevated serum creatinine/AKI; 0.75 1/16 to 1.06 on 1/17             -follow-up BMP   1/18- stable- con't to monitor weekly and prn  1/29- Cr 1.16- from 0.75- will reduce Lasix  2/1- labs today- Cr 1.10- and no LE edema on exam  2/2- not drinking- per pt- hates water- encouraged Mio/to add flavor to water and to drink 6-8 cups/day.   2/7- Cr 1.82 down from 2.29 and BUN 37- off Lasix as of this AM- will give another 24 hours of IVFs at 75cc/hour and recheck in AM  2/8- stopped IVFs since Cr down to 1.20 and BUN down to 27- wil recheck in AM to make sure stays down.  16: GBS: continue to monitor NIFs/VIC q 12 hours             -continue gabapentin   1/19- will increase gabapaentin to 900 mg TID  1/23- slightly better- con't  regimen  2/1- tingling/burning is gone on Gabapentin  2/4: NIFs -40   2/7- NIF's stable at -40  2/8- will stop Vital capacity and NIF since been good for 1 week- will restart if Sx's get worse.  17: Glaucoma: continue latanoprost eye drops   18: Tobacco use: cessation counseling   19. Constipation  1/18- will con't Senna per pt request- to not increase- but if no BM by tomorrow, will try Sorbitol.   2/2 regular BM's per pt 2/3 - large BM  2/6- LBM 2 days ago- if no result by tomorrow, will give Sorbitol 20.. insomnia  1/20- will start Melatonin 5 mg QHS prn- don't want trazodone since can cause urinary retention  1/22- sleeping a little better 21. Anxiety  1/24- added Buspar 5 mg TID- will also add Celexa for mood/anxiety prevention 20 mg daily. 1/25- no side effects so far.    1/26-27- feels anxiety is do ing MUCH better per pt. 2/1- anxiety still limiting therapy per PT/OT- will increase Buspar to 10 mg TID 2/7- pt says anxiety still limiting- so does therapy- will increase Celexa to 40 mg daily.  22. R knee buckling/pain  2/1- got brace- wasn't real effective/"is thin"- I educated pt another type knee brace would be too heavy for her to lift with her weakness- will call ortho to see if any ideas since hurting MORE overnight- R knee- -ever since fall over weekend on R knee directly.   2/2- Xray just shows severe knee DJD- need for Knee replacement- no fractures- will add Diclofenac 50 mg TID x 5 days to help pain. Since her renal function slightly impaired (not drinking water), will not do longer than 5 days.  23. Hyperkalemia  2/7- K+ down to 5.1- if still elevated tomorrow, then will use Lokelma  2/9- K+ 4.9-    I spent a total of 36   minutes on total care today- >50% coordination of care- due to  D/w PA's about stopping NIF/Vital capacity and will reinstate if Sx's get worse   LOS: 22 days A FACE TO FACE EVALUATION WAS PERFORMED  Dana [name] 10/24/2022, 8:57 AM

## 2022-10-24 NOTE — Progress Notes (Signed)
Occupational Therapy Session Note  Patient Details  Name: Dana [name] MRN: [id number] Date of Birth: [birth year]  Today's Date: 10/24/2022 OT Individual Time: 0800-0900 OT Individual Time Calculation (min): 60 min    Short Term Goals: Week 3:  OT Short Term Goal 2 (Week 3): STGs=LTGs due to patient's length of stay.  Skilled Therapeutic   Pt reporting 8/10 back pain in lower region. OT applied moist heat while in bed while OT set up am self care routine. Ed nursing disconnected IV fluids briefly this am for self care routine. Deferred shower until IV fluids completed tomorrow am. LB sponge bathing and dressing bed level and UB sponge bathing and dressing focus at EOB s pt refused shower and oob to sink side. Same level of assist required this visit as yesterday however was able to don bra which hooks in front with min A incl 8 hooks with min A with increased time.  OT reinforced B UE HEP including breathing and theraband for elbow extension and pt able to complete with demo only. Left pt bed level as per request with bed exit, needs and bed exit engaged.     Therapy Documentation Precautions:  Precautions Precautions: Fall Precaution Comments: weakness, numbness L anterior thigh. Restrictions Weight Bearing Restrictions: No General:   Vital Signs: Therapy Vitals Temp: 98.7 F (37.1 C) Pulse Rate: 65 Resp: 16 BP: (!) 156/73 Patient Position (if appropriate): Lying Oxygen Therapy SpO2: 100 % O2 Device: Room Air    Therapy/Group: Individual Therapy  Barnabas Lister 10/24/2022, 7:38 AM

## 2022-10-24 NOTE — Progress Notes (Signed)
Physical Therapy Session Note  Patient Details  Name: Dana [name] MRN: [id number] Date of Birth: Mar 23, 1966  Today's Date: 10/24/2022 PT Individual Time: 1100-1208, 1601-0932  PT Individual Time Calculation (min): 68 min , 70 min   Short Term Goals: Week 3:  PT Short Term Goal 1 (Week 3): Pt will require CGA for STS PT Short Term Goal 2 (Week 3): Pt will require CGA for stand pivot transfer PT Short Term Goal 3 (Week 3): Pt will require CGA with LRAD x 30 ft  Skilled Therapeutic Interventions/Progress Updates:      Therapy Documentation Precautions:  Precautions Precautions: Fall Precaution Comments: weakness, numbness L anterior thigh. Restrictions Weight Bearing Restrictions: No  Treatment Session 1:   Pt received semi-reclined in bed with NT present. Pt requests need to toilet. Pt SBA with sit to stand with STEDY from bed and transferred to toilet. Pt continent of bowel and bladder, NT reports she will document in flowsheet. Pt supervision for peri-care and set-up for handwashing seated on STEDY flaps.   Pt with 10/10 central low back pain. Pt politely declines transfer and gait training and requests to perform exercises in recliner. PT provided pt with hot pack for pain relief x 15 minutes to affected area and notified nursing who administered pain medication at end of session.   PT session with emphasis on fine motor UE and hand coordination skills. Pt utilized left hand to form pincer grip with thumb and 2nd digit to retrieve connect four piece and place in connect four grid while adhering to specific pattern.   Pt left seated in recliner at bedside with all needs in reach and alarm on.   Treatment Session 2:   Pt received seated in recliner at bedside with Recreational Therapist present. Pt with unrated back pain, improved since AM session with heat and muscle relaxant.   Pt agreeable to exercises in recliner at bedside and declines transfer and gait training 2/2 back  pain.   Pt performed following exercises to address UE strength and coordination deficits with red thera band for increased resistance:   -scapular retractions 3 x 10   -shoulder horizontal abduction  -elbow extension  Following activity pt requested rest break and reports spike in low back pain and PT provided pt with heat pad x 15 minutes for relief. Nurse delivered K pad and set-up in room and pt transitioned to utilization of device for thermotherapy instead of hot pack. PT also provided pt with music for auditory distraction with pain management.   Pt left seated in recliner at bedside with nurse present for medication administration.      Therapy/Group: Individual Therapy  Verl Dicker Verl Dicker PT, DPT  10/24/2022, 7:47 AM

## 2022-10-24 NOTE — Progress Notes (Addendum)
Nif greater than -40  VC 2.5 Liters

## 2022-10-24 NOTE — Progress Notes (Addendum)
Patient ID: CEARRA PORTNOY, female   DOB: November 20, 1965, 57 y.o.   MRN: [id number]  Spoke with [facility] who reports they have denied her clinically to come to their facility. Still waiting for Dustin Flock to get back with this worker. Will update pt and husband  12:31 PM Met with pt and sister in-law in room and had pt's husband on speaker phone so could update them all regarding [facility] and there refusal. All do not understand how facilities can decline pt. Discussed looked at care needs and felt not able to manage with the pt's they already have in their facility. Will need to expand search to all of [place]. Husband states: " She is just not going anywhere she needs to be in a good place. " He asked what happens if he does not like the bed offer. Then if refuses will have to go home. Plus there is a chance her commerical insurance will not cover due to coming to rehab. All understood this and will push forward.

## 2022-10-25 LAB — BASIC METABOLIC PANEL
Anion gap: 8 (ref 5–15)
BUN: 18 mg/dL (ref 6–20)
CO2: 26 mmol/L (ref 22–32)
Calcium: 8.9 mg/dL (ref 8.9–10.3)
Chloride: 101 mmol/L (ref 98–111)
Creatinine, Ser: 0.99 mg/dL (ref 0.44–1.00)
GFR, Estimated: >60 mL/min (ref >60)
Glucose, Bld: 107 mg/dL — ABNORMAL HIGH (ref 70–99)
Potassium: 5 mmol/L (ref 3.5–5.1)
Sodium: 135 mmol/L (ref 135–145)

## 2022-10-25 MED ORDER — CYCLOBENZAPRINE HCL 10 MG PO TABS
10.0000 mg | ORAL_TABLET | Freq: Three times a day (TID) | ORAL | Status: DC
  Administered 2022-10-25 – 2022-11-06 (×36): 10 mg via ORAL
  Filled 2022-10-25 (×36): qty 1

## 2022-10-25 NOTE — Progress Notes (Signed)
Recreational Therapy Discharge Summary Patient Details  Name: Dana [name] MRN: [id number] Date of Birth: Apr 15, 1966 Today's Date: 10/25/2022  Comments on progress toward goals: Pt with anticipated SNF placement, awaiting placement. TR sessions focused on pt education in regards the domains of wellness and their impact on each other and continued recovery.  Also discussed stress management/coping.  Pt will require emotional support throughout her continued recovery and transition back home.  Pt also participated in animal assisted activities during LOS.   Reasons for discharge: discharge from hospital  Follow-up:  anticipated d/c to SNF for continued therapies  Patient/family agrees with progress made and goals achieved: Yes  Ronald Londo 10/25/2022, 12:34 PM

## 2022-10-25 NOTE — Progress Notes (Signed)
C/o mid abdominal pain, occurred after dinner. No nausea or vomiting. Patient stated IV fluids was given to her,Lasix was not given.Active bowel sounds noted. PRN Mylanta offered and agreed to take.

## 2022-10-25 NOTE — Progress Notes (Signed)
Occupational Therapy Session Note  Patient Details  Name: Dana [name] MRN: [id number] Date of Birth: 08-Jun-1966  Today's Date: 10/25/2022 OT Individual Time: 1300-1400 OT Individual Time Calculation (min): 60 min, Missed 15 min due to pain and fatigue    10/25/22 1414  OT Individual Time Calculation  OT Individual Start Time 1300  OT Individual Stop Time 1400  OT Individual Time Calculation (min) 60 min  OT Missed Time  OT Amount of Missed Time 15 Minutes  OT Missed Time Reason Patient fatigue;Pain    Short Term Goals: Week 3:  OT Short Term Goal 2 (Week 3): STGs=LTGs due to patient's length of stay.  Skilled Therapeutic Interventions/Progress Updates:   Pt agreed to some OT as pt refused most of the earlier day due to back pain. Shower, gift shop, WII, massage, game out of room all offered and refused. Pt on kpad and OT reinforced not to overheat LB but pt reports "I would get an MRI if I could" so reports 10/10 pain. MD aware and meds given. Pt was agreeable to work on [facility]. OT retrieved Valentine's craft activity and with increased time, encouragement and rests, pt was able to use larger utility/craft scissors to cut squares in tissue paper to prep for craft of stined glass window art for holiday. Pt with 80% accuracy on lines. Pt then unable to cut heart shapes without guide lines but then reported too fatigued to complete session. Left recliner level with chair alarm, needs and call button in reach. Encouraged all UE HEP on tray table this weekend with pt in agreement as able.   Therapy Documentation Precautions:  Precautions Precautions: Fall Precaution Comments: weakness, numbness L anterior thigh. Restrictions Weight Bearing Restrictions: No    Therapy/Group: Individual Therapy  Barnabas Lister 10/25/2022, 7:17 AM

## 2022-10-25 NOTE — Progress Notes (Signed)
Physical Therapy Session Note  Patient Details  Name: MCKENLEY GAUVIN MRN: [id number] Date of Birth: [birth year]  Today's Date: 10/25/2022 PT Individual Time: [id number] PT Individual Time Calculation (min): 16 min  And Today's Date: 10/25/2022 PT Missed Time: 29 Minutes Missed Time Reason: Patient fatigue;Pain;Patient unwilling to participate  Short Term Goals: Week 2:  PT Short Term Goal 1 (Week 2): Pt will require CGA for STS with LRAD PT Short Term Goal 1 - Progress (Week 2): Partly met PT Short Term Goal 2 (Week 2): Pt will require CGA for standing balance with LRAD PT Short Term Goal 2 - Progress (Week 2): Met PT Short Term Goal 3 (Week 2): Pt will require max A for bed to chair transfer with LRAD PT Short Term Goal 3 - Progress (Week 2): Met Week 3:  PT Short Term Goal 1 (Week 3): Pt will require CGA for STS PT Short Term Goal 2 (Week 3): Pt will require CGA for stand pivot transfer PT Short Term Goal 3 (Week 3): Pt will require CGA with LRAD x 30 ft  Skilled Therapeutic Interventions/Progress Updates:  Patient supine in bed on entrance to room. Patient alert and relates unrelenting back pain this day with no numerical rating. Did not get quality sleep last night with abdominal pain  and need to toilet frequently.   Pt's husband in room and packing pt's clothes d/t upcoming discharge with pt headed to SNF.   Pt declines OOB activity but does perform bed mobility with MinA to rearrange k-pad. Educated pt re: need to mobilize in order to increase blood flow to lower back musculature for pain mgmt. Pt continues to c/o pain and fatigue with no desire to get OOB or perform bed level activities.   Patient left supine in bed at end of session with brakes locked, bed alarm set, and all needs within reach.   Therapy Documentation Precautions:  Precautions Precautions: Fall Precaution Comments: weakness, numbness L anterior thigh. Restrictions Weight Bearing Restrictions:  No General:   Vital Signs: Therapy Vitals Temp: 98.4 F (36.9 C) Pulse Rate: 71 Resp: 17 BP: (!) 163/81 Patient Position (if appropriate): Sitting Oxygen Therapy SpO2: 100 % O2 Device: Room Air Pain:  Pt c/o back pain (unrated) that has not relented this day. Adjusted k-pad and settings for relief.   Therapy/Group: Individual Therapy  Alger Simons PT, DPT, CSRS 10/25/2022, 4:27 PM

## 2022-10-25 NOTE — Progress Notes (Signed)
PROGRESS NOTE   Subjective/Complaints:  Had abd pain last night- feeling better from that but had BM's "all night"- so exhausted this AM- poor sleep Doesn't feel good as a result Also having muscle spasms- whole body sometimes and Back pain still bothering her, but not quite as bad- hasn't gotten pain meds/muscle relaxants this AM yet- asked nurse to bring.   Will schedule flexeril for now  ROS:  Pt denies SOB, abd pain, CP, N/V/C/D, and vision changes  Except for HPI Objective:   No results found. No results for input(s): "WBC", "HGB", "HCT", "PLT" in the last 72 hours.    Recent Labs    10/24/22 0517 10/25/22 0535  NA 138 135  K 4.9 5.0  CL 105 101  CO2 24 26  GLUCOSE 100* 107*  BUN 27* 18  CREATININE 1.20* 0.99  CALCIUM 8.5* 8.9     Intake/Output Summary (Last 24 hours) at 10/25/2022 0848 Last data filed at 10/24/2022 2057 Gross per 24 hour  Intake 718 ml  Output --  Net 718 ml        Physical Exam: Vital Signs Blood pressure 131/64, pulse 79, temperature 98.2 F (36.8 C), resp. rate 16, height 5' 4"$  (1.626 m), weight 129 kg, last menstrual period 08/17/2012, SpO2 100 %.       General: awake, alert, appropriate, appears like doesn't feel well; sitting on toilet used Stedy to get there; NT in room;  NAD HENT: conjugate gaze; oropharynx a little dry CV: regular rate and rhythm; no JVD Pulmonary: CTA B/L; no W/R/R- good air movement GI: soft, NT, less distended; normoactive BS Psychiatric: appropriate- but c/o feeling poorly Neurological: Ox3 Saw entire body muscle spasm that was painful for pt Extremities; no LE edema  Prior exams: MS: less TTP in R knee- effusion slightly better; B/ hand shaking- due to weakness sin hands/Ue's.  Neuro: Alert and oriented x 3. Normal insight and awareness. Intact Memory. Normal language and speech. Cranial nerve exam unremarkable. Stocking glove sensory loss  BLE L>>R. BUE both hands/wrists. UE strength 4/5 except distal is 4-/5 LE's- HF 2+/5; KE 4-/5; DF 3+ to 4-/5 and PF 4- to 4/5 B/L  Skin: multiple IV's and blood draw tape areas- MS: R knee- still has mild effusion- TTP diffusely.    Assessment/Plan: 1. Functional deficits which require 3+ hours per day of interdisciplinary therapy in a comprehensive inpatient rehab setting. Physiatrist is providing close team supervision and 24 hour management of active medical problems listed below. Physiatrist and rehab team continue to assess barriers to discharge/monitor patient progress toward functional and medical goals  Care Tool:  Bathing    Body parts bathed by patient: Right arm, Left arm, Chest, Abdomen, Front perineal area, Right upper leg, Left upper leg, Face   Body parts bathed by helper: Buttocks, Right lower leg, Left lower leg     Bathing assist Assist Level: Moderate Assistance - Patient 50 - 74%     Upper Body Dressing/Undressing Upper body dressing   What is the patient wearing?: Pull over shirt    Upper body assist Assist Level: Set up assist    Lower Body Dressing/Undressing Lower body dressing  What is the patient wearing?: Underwear/pull up, Pants     Lower body assist Assist for lower body dressing: Maximal Assistance - Patient 25 - 49%     Toileting Toileting    Toileting assist Assist for toileting: 2 Helpers     Transfers Chair/bed transfer  Transfers assist     Chair/bed transfer assist level: Contact Guard/Touching assist     Locomotion Ambulation   Ambulation assist   Ambulation activity did not occur: Safety/medical concerns  Assist level: 2 helpers Assistive device: Lite Gait Max distance: 32   Walk 10 feet activity   Assist  Walk 10 feet activity did not occur: Safety/medical concerns    Assistive device: Lite Gait   Walk 50 feet activity   Assist Walk 50 feet with 2 turns activity did not occur: Safety/medical  concerns         Walk 150 feet activity   Assist Walk 150 feet activity did not occur: Safety/medical concerns         Walk 10 feet on uneven surface  activity   Assist Walk 10 feet on uneven surfaces activity did not occur: Safety/medical concerns         Wheelchair     Assist Is the patient using a wheelchair?: Yes Type of Wheelchair: Power    Wheelchair assist level: Supervision/Verbal cueing Max wheelchair distance: 150    Wheelchair 50 feet with 2 turns activity    Assist        Assist Level: Supervision/Verbal cueing   Wheelchair 150 feet activity     Assist      Assist Level: Supervision/Verbal cueing   Blood pressure 131/64, pulse 79, temperature 98.2 F (36.8 C), resp. rate 16, height 5' 4"$  (1.626 m), weight 129 kg, last menstrual period 08/17/2012, SpO2 100 %.  Medical Problem List and Plan: 1. 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 More training with chair so she can get out of tight spaces  SNF still plan  Con't CIR PT and OT 2.  Antithrombotics: -DVT/anticoagulation:  Pharmaceutical: Lovenox start 1/17             -antiplatelet therapy: none   3. 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 Changed flexeril to 10 mg TID- not prn 4. Mood/Behavior/Sleep: LCSW to evaluate and provide  emotional support             -antipsychotic agents: n/a   1/24- anxiety is limiting therapy- will add Buspar and Celexa for anxiety  1/28 a little frustrated by ongoing deficits  1/30- thinks need SNF due to deficits- frustrated by this  2/1- will increase Buspar  2/7- increased Celexa to 40 mg daily 5. Neuropsych/cognition: This patient is capable of making decisions on her own behalf.   6. Skin/Wound Care: Routine skin care checks   7. Fluids/Electrolytes/Nutrition: Routine Is and Os and follow-up chemistries             -protein supplements for hypoalbuminemia             -continue folic acid, [id number]             -continue MVI   1/18- will check with pharmacy if needs B12 shots AND daily pills.   8: Chronic back pain/radiculopathy:  -continue Dilaudid 4 mg q 6 hours - is her home medicine (PTA per Dr. [name])             -continue gabapentin 600 mg TID             -continue Lidoderm patch daily             -continue Flexeril 10 mg TID prn   1/26- will d/c Lidoderm- not using.  Otherwise, pain controlled- con't regimen 9: Urinary retention: Foley replaced on 1/15             -treated for UTI with Rocephin for  7 days   1/18- will start Flomax 0.4 mg qsupper and will try and remove Foley in AM- explained will need in/out caths likely before bladder muscle kicks in and she can void.   1/19- foley out this Am at 6am- hasn't voided yet- added bladder scans q6 hours and then cath of volumes /350cc- pt made aware can take a few days to kick in/the flomax. Has voided some and cathed x1-   1/20- no more caths require-d voiding on her own- con't flomax  1/22- d/c bladder scans since  voiding well- only required 1 cath since foley removed fyi.   1/23- voiding well- d/c bladder scans  2/6- resolved? But Cr up to 2.29- and BUN 26-   2/7- off Lasix  2/8- Cr 1.21 and Bun 27- sotpped IVFs 10: Hypertension: monitor TID and prn             -continue Lasix 40 mg daily             -continue Lopressor 50 mg BID             -add magnesium gluconate 241m HS   1/22- BP controlled- con't regimen  1/29- Lasix 40 mg BID- but home dose 40 mg daily- since Cr 1.26 up from 0.75 last week, will reduce to daily. And recheck Thursday  2/1- new Cr 1.10- and BUN better at 17- from 22- will recheck Monday- sinc eon lower dose of Lasix- no increase in LE edema  2/6- Cr up to 2.29- will recheck today and in AM- says she's drinking- will recheck today and if still elevated, we need IVFs 75cc/hour. If is still elevated, will also stop Lasix for now.   2/7- down to 1.82- will do IVF's another 24 hours and recheck in AM  2/8- will stop IVFs- off Lasix- no LE edema- needs weight- will order    10/25/2022    3:55  AM 10/24/2022    7:36 PM 10/24/2022    2:41 PM  Vitals with BMI  Systolic [id number]  Diastolic 64 85 82  Pulse 79 74 68     11: GERD?/GI prophylaxis: continue Protonix   12: B12 deficiency: continue supplementation   1/19- says itches with B12 shots, but just takes benadryl 13: Class 3 obesity: BMI = 48; weight loss counseling   1/24- BMI down to 46.62  2/1- BMI 47.15  2/7- pt eating a lot of junk food per staff- 1 lb M&M's eaten in 2 days; having a lot of fast food brought by husband 14: Hyponatremia, mild: follow-up BMP   2/1- Na 137- doing better 15: Elevated serum creatinine/AKI; 0.75 1/16 to 1.06 on 1/17             -follow-up BMP   1/18- stable- con't to monitor weekly and prn  1/29- Cr 1.16- from 0.75- will reduce Lasix  2/1- labs today- Cr 1.10- and no LE edema on exam  2/2- not drinking- per pt- hates water- encouraged Mio/to add flavor to water and to drink 6-8 cups/day.    2/7- Cr 1.82 down from 2.29 and BUN 37- off Lasix as of this AM- will give another 24 hours of IVFs at 75cc/hour and recheck in AM  2/8- stopped IVFs since Cr down to 1.20 and BUN down to 27- wil recheck in AM to make sure stays down. 2/9- Cr 0.99 and BUN 18- no IVFs in last 24 hours- so happy with results- encouraged pt to keep drinking water. Recheck Monday  16: GBS: continue to monitor NIFs/VIC q 12 hours             -continue gabapentin   1/19- will increase gabapaentin to 900 mg TID  1/23- slightly better- con't regimen  2/1- tingling/burning is gone on Gabapentin  2/4: NIFs -40   2/7- NIF's stable at -40  2/8- will stop Vital capacity and NIF since been good for 1 week- will restart if Sx's get worse.  17: Glaucoma: continue latanoprost eye drops   18: Tobacco use: cessation counseling   19. Constipation  1/18- will con't Senna per pt request- to not increase- but if no BM by tomorrow, will try Sorbitol.   2/2 regular BM's per pt 2/3 - large BM  2/6- LBM 2 days ago- if no result by tomorrow, will give Sorbitol  2/9- finally got cleaned out last night after Mylanta-  20.. insomnia  1/20- will start Melatonin 5 mg QHS prn- don't want trazodone since can cause urinary retention  1/22- sleeping a little better 21. Anxiety  1/24- added Buspar 5 mg TID- will also add Celexa for mood/anxiety prevention 20 mg daily. 1/25- no side effects so far.    1/26-27- feels anxiety is do ing MUCH better per pt. 2/1- anxiety still limiting therapy per PT/OT- will increase Buspar to 10 mg TID 2/7- pt says anxiety still limiting- so does therapy- will increase Celexa to 40 mg daily.  22. R knee buckling/pain  2/1- got brace- wasn't real effective/"is thin"- I educated pt another type knee brace would be too heavy for her to lift with her weakness- will call ortho to see if any ideas since hurting MORE overnight- R knee- -ever since fall over weekend on R knee directly.   2/2- Xray just shows  severe knee DJD- need for Knee replacement- no fractures- will add Diclofenac 50 mg TID x 5 days to help pain. Since  her renal function slightly impaired (not drinking water), will not do longer than 5 days.   2/9- pain has been doing bette rin R knee 23. Hyperkalemia  2/7- K+ down to 5.1- if still elevated tomorrow, then will use Lokelma  2/9- K+ 4.9-   2/9- K+ 5.0- pt doesn't want Loklema since just "got cleaned out".  24. Abd pain  2/9- given mylanta- but had Bms "all night"- so likely was full of stool causing abd pain- eating more than meals from hospital- a lot of snacks from outside.    I spent a total of  40  minutes on total care today- >50% coordination of care- due to  D/w nursing about muscle spasms and back pain- also about frequent BM's overnight- seems got cleaned out with Mylanta- probable reason she had abd pain last night.    LOS: 23 days A FACE TO FACE EVALUATION WAS PERFORMED  Searcy Miyoshi 10/25/2022, 8:48 AM

## 2022-10-25 NOTE — Progress Notes (Signed)
Patient stated her abdominal pain was relieved from Mylanta.

## 2022-10-25 NOTE — Progress Notes (Signed)
Physical Therapy Session Note  Patient Details  Name: Dana [name] MRN: [id number] Date of Birth: 04-Mar-1966  Today's Date: 10/25/2022    Pt missed 45 min of skilled PT due to back pain. Offered various options to perform therapies at bed level or EOB but she declined. She states she would prefer just to rest right now. Pt already had pain medication and using heating pad currently.   Therapy Documentation Precautions:  Precautions Precautions: Fall Precaution Comments: weakness, numbness L anterior thigh. Restrictions Weight Bearing Restrictions: No General: PT Amount of Missed Time (min): 45 Minutes PT Missed Treatment Reason: Patient unwilling to participate;Pain  Pain: Pain Assessment Pain Scale: 0-10 Pain Score: 10-Worst pain ever Pain Type: Chronic pain Pain Location: Back Pain Orientation: Lower Pain Descriptors / Indicators: Aching Pain Frequency: Constant Pain Onset: On-going Pain Intervention(s): Medication (See eMAR)    Therapy/Group: Individual Therapy  Canary Brim Ivory Broad, PT, DPT, CBIS  10/25/2022, 10:15 AM

## 2022-10-25 NOTE — Progress Notes (Signed)
Physical Therapy Session Note  Patient Details  Name: Dana [name] MRN: [id number] Date of Birth: March 22, 1966  Today's Date: 10/25/2022 PT Individual Time: 0900-0930 PT Individual Time Calculation (min): 30 min   Short Term Goals: Week 3:  PT Short Term Goal 1 (Week 3): Pt will require CGA for STS PT Short Term Goal 2 (Week 3): Pt will require CGA for stand pivot transfer PT Short Term Goal 3 (Week 3): Pt will require CGA with LRAD x 30 ft Week 3:  PT Short Term Goal 1 (Week 3): Pt will require CGA for STS PT Short Term Goal 2 (Week 3): Pt will require CGA for stand pivot transfer PT Short Term Goal 3 (Week 3): Pt will require CGA with LRAD x 30 ft Skilled Therapeutic Interventions/Progress Updates:    pt received in bed and agreeable to therapy. Pt reports high levels of back pain this AM and declining OOB activity.  Pt performed the following exercises to promote LE strength and endurance:  Ankle pumps x 20  Supine abduction 3 x 5 bil Attempted heel slides, but pt was visibly grimacing d/t increased pain, so discontinued  SLR, 3 x 10 SAQ 3 x 10 All activities to pt tolerance d/t high pain levels, but pt did make good effort to participate despite pain. Pt remained in bed after session and was left with all needs in reach and alarm active.   Therapy Documentation Precautions:  Precautions Precautions: Fall Precaution Comments: weakness, numbness L anterior thigh. Restrictions Weight Bearing Restrictions: No General:   Vital Signs:   Pain: Pain Assessment Pain Scale: 0-10 Pain Score: 10-Worst pain ever Pain Type: Chronic pain Pain Location: Back Pain Orientation: Lower Pain Descriptors / Indicators: Aching Pain Frequency: Constant Pain Onset: On-going Pain Intervention(s): Medication (See eMAR) Mobility:   Locomotion :    Trunk/Postural Assessment :    Balance:   Exercises:   Other Treatments:      Therapy/Group: Individual Therapy  Mickel Fuchs 10/25/2022, 9:09 AM

## 2022-10-26 DIAGNOSIS — E871 Hypo-osmolality and hyponatremia: Secondary | ICD-10-CM

## 2022-10-26 DIAGNOSIS — M545 Low back pain, unspecified: Secondary | ICD-10-CM

## 2022-10-26 DIAGNOSIS — R1084 Generalized abdominal pain: Secondary | ICD-10-CM

## 2022-10-26 NOTE — Progress Notes (Signed)
PROGRESS NOTE   Subjective/Complaints: She does continue have some left-sided back pain.  Medications/muscle relaxers are helping to keep her pain under control.  ROS:  Pt denies SOB, abd pain, CP, N/V/C/D, and vision changes Positive back pain  Except for HPI Objective:   No results found. No results for input(s): "WBC", "HGB", "HCT", "PLT" in the last 72 hours.    Recent Labs    10/24/22 0517 10/25/22 0535  NA 138 135  K 4.9 5.0  CL 105 101  CO2 24 26  GLUCOSE 100* 107*  BUN 27* 18  CREATININE 1.20* 0.99  CALCIUM 8.5* 8.9      Intake/Output Summary (Last 24 hours) at 10/26/2022 1420 Last data filed at 10/26/2022 1411 Gross per 24 hour  Intake 716 ml  Output --  Net 716 ml         Physical Exam: Vital Signs Blood pressure (!) 140/74, pulse 68, temperature (!) 97.5 F (36.4 C), resp. rate 17, height 5' 4"$  (1.626 m), weight 129 kg, last menstrual period 08/17/2012, SpO2 100 %.       General: awake, alert, appropriate, NAD, appears comfortable in bed HENT: conjugate gaze; oropharynx a little dry CV: regular rate and rhythm; no JVD Pulmonary: CTA B/L; no W/R/R- good air movement GI: soft, NT, less distended; normoactive BS Psychiatric: appropriate- but c/o feeling poorly Neurological: Ox3, follows commands cranial nerves grossly intact Extremities; no LE edema  Prior exams: MS: less TTP in R knee- effusion slightly better; B/ hand shaking- due to weakness sin hands/Ue's.  Neuro: Alert and oriented x 3. Normal insight and awareness. Intact Memory. Normal language and speech. Cranial nerve exam unremarkable. Stocking glove sensory loss BLE L>>R. BUE both hands/wrists. UE strength 4/5 except distal is 4-/5 LE's- HF 2+/5; KE 4-/5; DF 3+ to 4-/5 and PF 4- to 4/5 B/L  Skin: multiple IV's and blood draw tape areas- MS: R knee- still has mild effusion- TTP diffusely.    Assessment/Plan: 1. Functional  deficits which require 3+ hours per day of interdisciplinary therapy in a comprehensive inpatient rehab setting. Physiatrist is providing close team supervision and 24 hour management of active medical problems listed below. Physiatrist and rehab team continue to assess barriers to discharge/monitor patient progress toward functional and medical goals  Care Tool:  Bathing    Body parts bathed by patient: Right arm, Left arm, Chest, Abdomen, Front perineal area, Right upper leg, Left upper leg, Face   Body parts bathed by helper: Buttocks, Right lower leg, Left lower leg     Bathing assist Assist Level: Moderate Assistance - Patient 50 - 74%     Upper Body Dressing/Undressing Upper body dressing   What is the patient wearing?: Pull over shirt    Upper body assist Assist Level: Set up assist    Lower Body Dressing/Undressing Lower body dressing      What is the patient wearing?: Underwear/pull up, Pants     Lower body assist Assist for lower body dressing: Maximal Assistance - Patient 25 - 49%     Toileting Toileting    Toileting assist Assist for toileting: 2 Helpers     Transfers Chair/bed transfer  Transfers assist     Chair/bed transfer assist level: Contact Guard/Touching assist     Locomotion Ambulation   Ambulation assist   Ambulation activity did not occur: Safety/medical concerns  Assist level: 2 helpers Assistive device: Lite Gait Max distance: 32   Walk 10 feet activity   Assist  Walk 10 feet activity did not occur: Safety/medical concerns    Assistive device: Lite Gait   Walk 50 feet activity   Assist Walk 50 feet with 2 turns activity did not occur: Safety/medical concerns         Walk 150 feet activity   Assist Walk 150 feet activity did not occur: Safety/medical concerns         Walk 10 feet on uneven surface  activity   Assist Walk 10 feet on uneven surfaces activity did not occur: Safety/medical concerns          Wheelchair     Assist Is the patient using a wheelchair?: Yes Type of Wheelchair: Power    Wheelchair assist level: Supervision/Verbal cueing Max wheelchair distance: 150    Wheelchair 50 feet with 2 turns activity    Assist        Assist Level: Supervision/Verbal cueing   Wheelchair 150 feet activity     Assist      Assist Level: Supervision/Verbal cueing   Blood pressure (!) 140/74, pulse 68, temperature (!) 97.5 F (36.4 C), resp. rate 17, height 5' 4"$  (1.626 m), weight 129 kg, last menstrual period 08/17/2012, SpO2 100 %.  Medical Problem List and Plan: 1. 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 More training with chair so she can get out of tight spaces  SNF still plan  Con't CIR PT and OT  2.  Antithrombotics: -DVT/anticoagulation:  Pharmaceutical: Lovenox start 1/17             -antiplatelet therapy: none   3. 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 Changed flexeril to 10 mg TID- not prn  2/10  continue current frequency Dilaudid for now, continue Flexeril, sounds like pain is a little better 4. Mood/Behavior/Sleep: LCSW to evaluate and provide emotional support             -antipsychotic agents: n/a   1/24- anxiety is limiting therapy- will add Buspar and Celexa for anxiety  1/28 a little frustrated by ongoing deficits  1/30- thinks need SNF due to deficits- frustrated by this  2/1- will increase Buspar  2/7- increased Celexa to 40 mg daily 5. Neuropsych/cognition: This patient is capable of making decisions on her own behalf.   6. Skin/Wound Care: Routine skin care checks   7. Fluids/Electrolytes/Nutrition: Routine Is and Os and follow-up chemistries             -protein supplements for hypoalbuminemia             -continue folic acid, [id number]             -continue MVI   1/18- will check with pharmacy if needs B12 shots AND daily pills.   8: Chronic back pain/radiculopathy:  -continue Dilaudid 4 mg q 6 hours - is her home medicine (PTA per Dr. [name])             -continue gabapentin 600 mg TID             -continue Lidoderm patch daily             -continue Flexeril 10 mg TID prn   1/26- will d/c Lidoderm- not using.  Otherwise, pain controlled- con't regimen 9: Urinary retention: Foley replaced on 1/15             -treated for UTI with Rocephin for  7 days   1/18- will start Flomax 0.4 mg qsupper and will try and remove Foley in AM- explained will need in/out caths likely before bladder muscle kicks in and she can void.   1/19- foley out this Am at 6am- hasn't voided yet- added bladder scans q6 hours and then cath of volumes /350cc- pt made aware can take a few days to kick in/the flomax. Has voided some and cathed x1-   1/20- no more caths require-d voiding on her own- con't flomax  1/22- d/c bladder scans since voiding well- only required 1 cath since foley removed fyi.   1/23- voiding well- d/c bladder scans  2/6- resolved? But Cr up to 2.29- and BUN 26-   2/7- off  Lasix  2/8- Cr 1.21 and Bun 27- sotpped IVFs 10: Hypertension: monitor TID and prn             -continue Lasix 40 mg daily             -continue Lopressor 50 mg BID             -add magnesium gluconate 244m HS   1/22- BP controlled- con't regimen  1/29- Lasix 40 mg BID- but home dose 40 mg daily- since Cr 1.26 up from 0.75 last week, will reduce to daily. And recheck Thursday  2/1- new Cr 1.10- and BUN better at 17- from 22- will recheck Monday- sinc eon lower dose of Lasix- no increase in LE edema  2/6- Cr up to 2.29- will recheck today and in AM- says she's drinking- will recheck today and if still elevated, we need IVFs 75cc/hour. If is still elevated, will also stop Lasix for now.   2/7- down to 1.82- will do IVF's another 24 hours and recheck in AM  2/8- will stop IVFs- off Lasix- no LE edema- needs weight- will order  2/10 elevated, possibly pain related, continue to monitor for now    10/26/2022    2:01 PM 10/26/2022    5:35 AM 10/25/2022    8:32 PM  Vitals with BMI  Systolic [id number]  [id number]  Diastolic 74 69 86  Pulse 68 71 72     11: GERD?/GI prophylaxis: continue Protonix   12: B12 deficiency: continue supplementation   1/19- says itches with B12 shots, but just takes benadryl 13: Class 3 obesity: BMI = 48; weight loss counseling   1/24- BMI down to 46.62  2/1- BMI 47.15  2/7- pt eating a lot of junk food per staff- 1 lb M&M's eaten in 2 days; having a lot of fast food brought by husband 14: Hyponatremia, mild: follow-up BMP   2/1- Na 137- doing better  Recheck monday 15: Elevated serum creatinine/AKI; 0.75 1/16 to 1.06 on 1/17             -follow-up BMP   1/18- stable- con't to monitor weekly and prn  1/29- Cr 1.16- from 0.75- will reduce Lasix  2/1- labs today- Cr 1.10- and no LE edema on exam  2/2- not drinking- per pt- hates water- encouraged Mio/to add flavor to water and to drink 6-8 cups/day.   2/7- Cr 1.82 down from 2.29 and BUN 37- off Lasix as of this AM- will  give another 24 hours of IVFs at 75cc/hour and recheck in AM  2/8- stopped IVFs since Cr down to 1.20 and BUN down to 27- wil recheck in AM to make sure stays down. 2/9- Cr 0.99 and BUN 18- no IVFs in last 24 hours- so happy with results- encouraged pt to keep drinking water. Recheck Monday  16: GBS: continue to monitor NIFs/VIC q 12 hours             -continue gabapentin   1/19- will increase gabapaentin to 900 mg TID  1/23- slightly better- con't regimen  2/1- tingling/burning is gone on Gabapentin  2/4: NIFs -40   2/7- NIF's stable at -40  2/8- will stop Vital capacity and NIF since been good for 1 week- will restart if Sx's get worse.  17: Glaucoma: continue latanoprost eye drops   18: Tobacco use: cessation counseling   19. Constipation  1/18- will con't Senna per pt request- to not increase- but if no BM by tomorrow, will try Sorbitol.   2/2 regular BM's per pt 2/3 - large BM  2/6- LBM 2 days ago- if no result by tomorrow, will give Sorbitol  2/9- finally got cleaned out last night after Mylanta-  20.. insomnia  1/20- will start Melatonin 5 mg QHS prn- don't want trazodone since can cause urinary retention  1/22- sleeping a little better 21. Anxiety  1/24- added Buspar 5 mg TID- will also add Celexa for mood/anxiety prevention 20 mg daily. 1/25- no side effects so far.    1/26-27- feels anxiety is do ing MUCH better per pt. 2/1- anxiety still limiting therapy per PT/OT- will increase Buspar to 10 mg TID 2/7- pt says anxiety still limiting- so does therapy- will increase Celexa to 40 mg daily.  22. R knee buckling/pain  2/1- got brace- wasn't real effective/"is thin"- I educated pt another type knee brace would be too heavy for her to lift with her weakness- will call ortho to see if any ideas since hurting MORE overnight- R knee- -ever since fall over weekend on R knee directly.   2/2- Xray just shows severe knee DJD- need for Knee replacement- no fractures- will add Diclofenac  50 mg TID x 5 days to help pain. Since her renal function slightly impaired (not drinking water), will not do longer than 5 days.   2/9-  pain has been doing bette rin R knee 23. Hyperkalemia  2/7- K+ down to 5.1- if still elevated tomorrow, then will use Lokelma  2/9- K+ 4.9-   2/9- K+ 5.0- pt doesn't want Loklema since just "got cleaned out".  24. Abd pain  2/9- given mylanta- but had Bms "all night"- so likely was full of stool causing abd pain- eating more than meals from hospital- a lot of snacks from outside.   2/10 improving, had multiple bowel movements this morning   LOS: 24 days A FACE TO FACE EVALUATION WAS PERFORMED  Jennye Boroughs 10/26/2022, 2:20 PM

## 2022-10-27 DIAGNOSIS — E875 Hyperkalemia: Secondary | ICD-10-CM

## 2022-10-27 DIAGNOSIS — K5900 Constipation, unspecified: Secondary | ICD-10-CM

## 2022-10-27 NOTE — Progress Notes (Signed)
Physical Therapy Weekly Progress Note  Patient Details  Name: Dana [name] MRN: [id number] Date of Birth: June 30, 1966  Beginning of progress report period: October 21, 2022 End of progress report period: October 27, 2022  Today's Date: 10/27/2022 PT Individual Time: 0915-1000, [id number]  PT Individual Time Calculation (min): 45 min , 50 min   Patient has met 1 of 3 short term goals.  Patient is making steady progress to long term goals. Pt largely requires supervision for bed mobility and min A with sit to stand transfers. Pt ambulating up to 30 ft min A with HDRW until onset of low back pain. Pt performed bed or chair level exercises for past four days 2/2 back pain. Plan to continue to address and progress with mobility. Plan to discharge to SNF as pt family unable to provide appropriate level of care for discharge home.   Patient continues to demonstrate the following deficits muscle weakness, decreased cardiorespiratoy endurance, decreased coordination and decreased motor planning, and decreased standing balance, decreased postural control, and decreased balance strategies and therefore will continue to benefit from skilled PT intervention to increase functional independence with mobility.  Patient progressing toward long term goals..  Continue plan of care.  PT Short Term Goals Week 1:  PT Short Term Goal 1 (Week 1): Pt will roll side to side w/ CGA. PT Short Term Goal 1 - Progress (Week 1): Met PT Short Term Goal 2 (Week 1): Pt will transfer sup <> sit w/ min A logroll technique. PT Short Term Goal 2 - Progress (Week 1): Met PT Short Term Goal 3 (Week 1): Pt will transfer sit to stand w/ min A PT Short Term Goal 3 - Progress (Week 1): Met PT Short Term Goal 4 (Week 1): Pt will stand x 5' w/ LRAD (Stedy vs RW). PT Short Term Goal 4 - Progress (Week 1): Met PT Short Term Goal 5 (Week 1): PT to assess gait. PT Short Term Goal 5 - Progress (Week 1): Not progressing Week 2:  PT Short  Term Goal 1 (Week 2): Pt will require CGA for STS with LRAD PT Short Term Goal 1 - Progress (Week 2): Partly met PT Short Term Goal 2 (Week 2): Pt will require CGA for standing balance with LRAD PT Short Term Goal 2 - Progress (Week 2): Met PT Short Term Goal 3 (Week 2): Pt will require max A for bed to chair transfer with LRAD PT Short Term Goal 3 - Progress (Week 2): Met Week 3:  PT Short Term Goal 1 (Week 3): Pt will require CGA for STS PT Short Term Goal 1 - Progress (Week 3): Met PT Short Term Goal 2 (Week 3): Pt will require CGA for stand pivot transfer PT Short Term Goal 2 - Progress (Week 3): Not met PT Short Term Goal 3 (Week 3): Pt will require CGA with LRAD x 30 ft PT Short Term Goal 3 - Progress (Week 3): Not met Week 4:  PT Short Term Goal 1 (Week 4): Pt will consisntely require CGA with STS with LRAD PT Short Term Goal 2 (Week 4): Pt will require min A for stand pivot transfer with LRAD PT Short Term Goal 3 (Week 4): Pt will require (S) for dynamic standing with LRAD  Skilled Therapeutic Interventions/Progress Updates:      Therapy Documentation Precautions:  Precautions Precautions: Fall Precaution Comments: weakness, numbness L anterior thigh. Restrictions Weight Bearing Restrictions: No  Treatment Session 1:   Pt received seated in  PWC at bedside and agreeable to PT session with emphasis on sit to stand transfer training.   Pt reports 7/10 back pain, pre-medicated and provided rest breaks for relief. Pt reports pain not present with transfer training.   Pt supervision for safety for [place] navigation to dayroom. Pt participated in blocked practice of 5 sit to stands with HDRW requiring CGA for safety.   -Trial 1: ~40 seconds   -Trial 2: 44 seconds   -Trial 3: 50 seconds   -Trial 4: 49 seconds   -Trial 5: 56 seconds  Pt navigated PWC to room and performed stedy transfer SBA to recliner. Pt left seated in recliner at bedside with chair alarm on and all needs within  reach.    Treatment Session 2:    Pt received seated in recliner at bedside, agreeable to PT session with emphasis on gait training. Pt with unrated back pain and states, "I'll be alright".   Pt SBA with STEDY transfer from recliner to [facility] and supervision for [place] negotiation to main gym.   Pt performed STS and gait x 4 ft in parallel bars with CGA and provided rest break for knee pain. Pt requests to return to gait training with HDRW.   Pt CGA with sit to stand and gait 5 + 5 ft with HDRW with close chair follow from additional person.   Pt left seated in recliner at bedside with all needs in reach and chair alarm on. Notified nurse pt requests ice for knee pain.   Therapy/Group: Individual Therapy  Verl Dicker Verl Dicker PT, DPT  10/27/2022, 7:36 AM

## 2022-10-27 NOTE — Progress Notes (Signed)
PROGRESS NOTE   Subjective/Complaints: She is working with therapy in the gym this morning.  Reports her back pain has been a little bit better than prior days.  She feels that the pain is tolerable with medications.  ROS:  Pt denies SOB, abd pain, CP, N/V/C/D, and vision changes Positive back pain-gradually improving  Except for HPI Objective:   No results found. No results for input(s): "WBC", "HGB", "HCT", "PLT" in the last 72 hours.    Recent Labs    10/25/22 0535  NA 135  K 5.0  CL 101  CO2 26  GLUCOSE 107*  BUN 18  CREATININE 0.99  CALCIUM 8.9      Intake/Output Summary (Last 24 hours) at 10/27/2022 1611 Last data filed at 10/27/2022 0730 Gross per 24 hour  Intake 456 ml  Output --  Net 456 ml         Physical Exam: Vital Signs Blood pressure 129/89, pulse 73, temperature 97.8 F (36.6 C), resp. rate 16, height 5' 4"$  (1.626 m), weight 129 kg, last menstrual period 08/17/2012, SpO2 100 %.       General: awake, alert, appropriate, NAD, working with therapy in gym HENT: conjugate gaze; oropharynx moist CV: regular rate and rhythm; no JVD Pulmonary: CTA B/L; no W/R/R- good air movement GI: soft, NT, non distended; normoactive BS Psychiatric: appropriate- but c/o feeling poorly Neurological: Ox3, follows commands cranial nerves grossly intact Extremities; no LE edema  Prior exams: MS: less TTP in R knee- effusion slightly better; B/ hand shaking- due to weakness sin hands/Ue's.  Neuro: Alert and oriented x 3. Normal insight and awareness. Intact Memory. Normal language and speech. Cranial nerve exam unremarkable. Stocking glove sensory loss BLE L>>R. BUE both hands/wrists. UE strength 4/5 except distal is 4-/5 LE's- HF 2+/5; KE 4-/5; DF 3+ to 4-/5 and PF 4- to 4/5 B/L  Skin: multiple IV's and blood draw tape areas- MS: R knee- still has mild effusion- TTP diffusely.    Assessment/Plan: 1.  Functional deficits which require 3+ hours per day of interdisciplinary therapy in a comprehensive inpatient rehab setting. Physiatrist is providing close team supervision and 24 hour management of active medical problems listed below. Physiatrist and rehab team continue to assess barriers to discharge/monitor patient progress toward functional and medical goals  Care Tool:  Bathing    Body parts bathed by patient: Right arm, Left arm, Chest, Abdomen, Front perineal area, Right upper leg, Left upper leg, Face   Body parts bathed by helper: Buttocks, Right lower leg, Left lower leg     Bathing assist Assist Level: Moderate Assistance - Patient 50 - 74%     Upper Body Dressing/Undressing Upper body dressing   What is the patient wearing?: Pull over shirt    Upper body assist Assist Level: Set up assist    Lower Body Dressing/Undressing Lower body dressing      What is the patient wearing?: Underwear/pull up, Pants     Lower body assist Assist for lower body dressing: Maximal Assistance - Patient 25 - 49%     Toileting Toileting    Toileting assist Assist for toileting: 2 Helpers     Transfers  Chair/bed transfer  Transfers assist     Chair/bed transfer assist level: Contact Guard/Touching assist     Locomotion Ambulation   Ambulation assist   Ambulation activity did not occur: Safety/medical concerns  Assist level: 2 helpers Assistive device: Lite Gait Max distance: 32   Walk 10 feet activity   Assist  Walk 10 feet activity did not occur: Safety/medical concerns    Assistive device: Lite Gait   Walk 50 feet activity   Assist Walk 50 feet with 2 turns activity did not occur: Safety/medical concerns         Walk 150 feet activity   Assist Walk 150 feet activity did not occur: Safety/medical concerns         Walk 10 feet on uneven surface  activity   Assist Walk 10 feet on uneven surfaces activity did not occur: Safety/medical  concerns         Wheelchair     Assist Is the patient using a wheelchair?: Yes Type of Wheelchair: Power    Wheelchair assist level: Supervision/Verbal cueing Max wheelchair distance: 150    Wheelchair 50 feet with 2 turns activity    Assist        Assist Level: Supervision/Verbal cueing   Wheelchair 150 feet activity     Assist      Assist Level: Supervision/Verbal cueing   Blood pressure 129/89, pulse 73, temperature 97.8 F (36.6 C), resp. rate 16, height 5' 4"$  (1.626 m), weight 129 kg, last menstrual period 08/17/2012, SpO2 100 %.  Medical Problem List and Plan: 1. 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 More training with chair so she can get out of tight spaces  SNF still plan  Con't CIR PT and OT  2.  Antithrombotics: -DVT/anticoagulation:  Pharmaceutical: Lovenox start 1/17             -antiplatelet therapy: none   3. 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 Changed flexeril to 10 mg TID- not  prn  2/11 Pain gradually improving, continue current dose dilaudid and flexeril 4. Mood/Behavior/Sleep: LCSW to evaluate and provide emotional support             -antipsychotic agents: n/a   1/24- anxiety is limiting therapy- will add Buspar and Celexa for anxiety  1/28 a little frustrated  by ongoing deficits  1/30- thinks need SNF due to deficits- frustrated by this  2/1- will increase Buspar  2/7- increased Celexa to 40 mg daily 5. Neuropsych/cognition: This patient is capable of making decisions on her own behalf.   6. Skin/Wound Care: Routine skin care checks   7. Fluids/Electrolytes/Nutrition: Routine Is and Os and follow-up chemistries             -protein supplements for hypoalbuminemia             -continue folic acid, [id number]             -continue MVI   1/18- will check with pharmacy if needs B12 shots AND daily pills.   8: Chronic back pain/radiculopathy:  -continue Dilaudid 4 mg q 6 hours - is her home medicine (PTA per Dr. [name])             -continue gabapentin 600 mg TID             -continue Lidoderm patch daily             -continue Flexeril 10 mg TID prn   1/26- will d/c Lidoderm- not using.  Otherwise, pain controlled- con't regimen 9: Urinary retention: Foley replaced on 1/15             -treated for UTI with Rocephin for  7 days   1/18- will start Flomax 0.4 mg qsupper and will try and remove Foley in AM- explained will need in/out caths likely before bladder muscle kicks in and she can void.   1/19- foley out this Am at 6am- hasn't voided yet- added bladder scans q6 hours and then cath of volumes /350cc- pt made aware can take a few days to kick in/the flomax. Has voided some and cathed x1-   1/20- no more caths require-d voiding on her own- con't flomax  1/22- d/c bladder scans since voiding well- only required 1 cath since foley removed fyi.   1/23- voiding well- d/c bladder scans  2/6- resolved? But Cr up to 2.29- and BUN 26-   2/7- off Lasix  2/8- Cr  1.21 and Bun 27- sotpped IVFs 10: Hypertension: monitor TID and prn             -continue Lasix 40 mg daily             -continue Lopressor 50 mg BID             -add magnesium gluconate 212m HS   1/22- BP controlled- con't regimen  1/29- Lasix 40 mg BID- but home dose 40 mg daily- since Cr 1.26 up from 0.75 last week, will reduce to daily. And recheck Thursday  2/1- new Cr 1.10- and BUN better at 17- from 22- will recheck Monday- sinc eon lower dose of Lasix- no increase in LE edema  2/6- Cr up to 2.29- will recheck today and in AM- says she's drinking- will recheck today and if still elevated, we need IVFs 75cc/hour. If is still elevated, will also stop Lasix for now.   2/7- down to 1.82- will do IVF's another 24 hours and recheck in AM  2/8- will stop IVFs- off Lasix- no LE edema- needs weight- will order  2/10 elevated, possibly pain related, continue to monitor for now  2/11 BP improved, may be related to pain improving, continue to monitor    10/27/2022    3:00 PM 10/27/2022    4:05 AM 10/26/2022    7:59  PM  Vitals with BMI  Systolic [id number]  Diastolic 89 90 65  Pulse 73 66 73     11: GERD?/GI prophylaxis: continue Protonix   12: B12 deficiency: continue supplementation   1/19- says itches with B12 shots, but just takes benadryl 13: Class 3 obesity: BMI = 48; weight loss counseling   1/24- BMI down to 46.62  2/1- BMI 47.15  2/7- pt eating a lot of junk food per staff- 1 lb M&M's eaten in 2 days; having a lot of fast food brought by husband 14: Hyponatremia, mild: follow-up BMP   2/1- Na 137- doing better  Recheck monday 15: Elevated serum creatinine/AKI; 0.75 1/16 to 1.06 on 1/17             -follow-up BMP   1/18- stable- con't to monitor weekly and prn  1/29- Cr 1.16- from 0.75- will reduce Lasix  2/1- labs today- Cr 1.10- and no LE edema on exam  2/2- not drinking- per pt- hates water- encouraged Mio/to add flavor to water and to drink 6-8 cups/day.   2/7- Cr 1.82  down from 2.29 and BUN 37- off Lasix as of this AM- will give another 24 hours of IVFs at 75cc/hour and recheck in AM  2/8- stopped IVFs since Cr down to 1.20 and BUN down to 27- wil recheck in AM to make sure stays down. 2/9- Cr 0.99 and BUN 18- no IVFs in last 24 hours- so happy with results- encouraged pt to keep drinking water. Recheck Monday  16: GBS: continue to monitor NIFs/VIC q 12 hours             -continue gabapentin   1/19- will increase gabapaentin to 900 mg TID  1/23- slightly better- con't regimen  2/1- tingling/burning is gone on Gabapentin  2/4: NIFs -40   2/7- NIF's stable at -40  2/8- will stop Vital capacity and NIF since been good for 1 week- will restart if Sx's get worse.  17: Glaucoma: continue latanoprost eye drops   18: Tobacco use: cessation counseling   19. Constipation  1/18- will con't Senna per pt request- to not increase- but if no BM by tomorrow, will try Sorbitol.   2/2 regular BM's per pt 2/3 - large BM  2/6- LBM 2 days ago- if no result by tomorrow, will give Sorbitol  2/9- finally got cleaned out last night after Mylanta-  20.. insomnia  1/20- will start Melatonin 5 mg QHS prn- don't want trazodone since can cause urinary retention  1/22- sleeping a little better 21. Anxiety  1/24- added Buspar 5 mg TID- will also add Celexa for mood/anxiety prevention 20 mg daily. 1/25- no side effects so far.    1/26-27- feels anxiety is do ing MUCH better per pt. 2/1- anxiety still limiting therapy per PT/OT- will increase Buspar to 10 mg TID 2/7- pt says anxiety still limiting- so does therapy- will increase Celexa to 40 mg daily.  22. R knee buckling/pain  2/1- got brace- wasn't real effective/"is thin"- I educated pt another type knee brace would be too heavy for her to lift with her weakness- will call ortho to see if any ideas since hurting MORE overnight- R knee- -ever since fall over weekend on R knee directly.   2/2- Xray just shows severe knee DJD- need  for Knee replacement- no fractures- will add Diclofenac 50 mg TID x 5 days to help pain. Since her renal function slightly impaired (not drinking water), will  not do longer than 5 days.   2/9- pain has been doing bette rin R knee 23. Hyperkalemia  2/7- K+ down to 5.1- if still elevated tomorrow, then will use Lokelma  2/9- K+ 4.9-   2/9- K+ 5.0- pt doesn't want Loklema since just "got cleaned out".   Recheck tomorrow 24. Abd pain  2/9- given mylanta- but had Bms "all night"- so likely was full of stool causing abd pain- eating more than meals from hospital- a lot of snacks from outside.   2/10 improving, had multiple bowel movements this morning  2/11 Additional BM today, improved   LOS: 25 days A FACE TO FACE EVALUATION WAS PERFORMED  Jennye Boroughs 10/27/2022, 4:11 PM

## 2022-10-27 NOTE — Progress Notes (Addendum)
Occupational Therapy Session Note  Patient Details  Name: ESPN WHRITENOUR MRN: [id number] Date of Birth: 03-Nov-1965  Today's Date: 10/27/2022 OT Individual Time: 1120-1200 OT Individual Time Calculation (min): 40 min    Short Term Goals: Week 3:  OT Short Term Goal 2 (Week 3): STGs=LTGs due to patient's length of stay.  Skilled Therapeutic Interventions/Progress Updates:  Pt received resting in recliner for skilled OT session with focus on gentle core/LB strengthening and back ROM. Pt agreeable to interventions, despite demonstrating overall frustrated mood over lost piercing. Pt with fluctuating pain in low back, stating "I really feel it when I move a weird way in bed." OT offering intermediate rest breaks and positioning suggestions throughout session to address pain/fatigue and maximize participation/safety in session.  Pt participates in the following exercises, completing 2 sets/10 reps each with/without 2# medicine ball: -Modified Sit-ups -Chest press -Seated Marches  Pt then performs the following exercises for the purpose of light lower back ROM:  -Gentle Torso Twists (5 second hold) -Seated "Cat Cows"   Pt tolerates 1-2 sets/5-10 reps of above exercises with no increased pain. Pt educated on purpose of manual massage and thera-gun for patient to consider using in next session for pain management. Ice applied.  Pt remained resting in recliner with all immediate needs met at end of session. Pt continues to be appropriate for skilled OT intervention to promote further functional independence.   Therapy Documentation Precautions:  Precautions Precautions: Fall Precaution Comments: weakness, numbness L anterior thigh. Restrictions Weight Bearing Restrictions: No   Therapy/Group: Individual Therapy  Maudie Mercury, OTR/L, MSOT  10/27/2022, 8:02 AM

## 2022-10-28 LAB — BASIC METABOLIC PANEL
Anion gap: 7 (ref 5–15)
BUN: 16 mg/dL (ref 6–20)
CO2: 26 mmol/L (ref 22–32)
Calcium: 9.1 mg/dL (ref 8.9–10.3)
Chloride: 103 mmol/L (ref 98–111)
Creatinine, Ser: 1.12 mg/dL — ABNORMAL HIGH (ref 0.44–1.00)
GFR, Estimated: 57 mL/min — ABNORMAL LOW (ref >60)
Glucose, Bld: 110 mg/dL — ABNORMAL HIGH (ref 70–99)
Potassium: 4.6 mmol/L (ref 3.5–5.1)
Sodium: 136 mmol/L (ref 135–145)

## 2022-10-28 LAB — CBC
HCT: 36.4 % (ref 36.0–46.0)
Hemoglobin: 11.4 g/dL — ABNORMAL LOW (ref 12.0–15.0)
MCH: 29 pg (ref 26.0–34.0)
MCHC: 31.3 g/dL (ref 30.0–36.0)
MCV: 92.6 fL (ref 80.0–100.0)
Platelets: 319 10*3/uL (ref 150–400)
RBC: 3.93 MIL/uL (ref 3.87–5.11)
RDW: 13.8 % (ref 11.5–15.5)
WBC: 9.9 10*3/uL (ref 4.0–10.5)
nRBC: 0 % (ref 0.0–0.2)

## 2022-10-28 MED ORDER — HYDROCERIN EX CREA
TOPICAL_CREAM | Freq: Two times a day (BID) | CUTANEOUS | Status: DC
  Administered 2022-10-31 – 2022-11-01 (×2): 1 via TOPICAL
  Filled 2022-10-28: qty 113

## 2022-10-28 NOTE — Progress Notes (Signed)
PROGRESS NOTE   Subjective/Complaints:  Pt reports was scratching her buttocks due to itching- fingertips were slightly bloody from scratching.  Otherwise, back pain is gradually improving.    ROS:   Pt denies SOB, abd pain, CP, N/V/C/D, and vision changes   Except for HPI Objective:   No results found. No results for input(s): "WBC", "HGB", "HCT", "PLT" in the last 72 hours.    No results for input(s): "NA", "K", "CL", "CO2", "GLUCOSE", "BUN", "CREATININE", "CALCIUM" in the last 72 hours.   Intake/Output Summary (Last 24 hours) at 10/28/2022 [id number] Last data filed at 10/27/2022 1700 Gross per 24 hour  Intake 354 ml  Output --  Net 354 ml        Physical Exam: Vital Signs Blood pressure 137/67, pulse 75, temperature 98.4 F (36.9 C), resp. rate 18, height 5' 4"$  (1.626 m), weight 129 kg, last menstrual period 08/17/2012, SpO2 98 %.        General: awake, alert, appropriate, supine in bed; NAD HENT: conjugate gaze; oropharynx moist CV: regular rate; no JVD Pulmonary: CTA B/L; no W/R/R- good air movement GI: soft, NT, ND, (+)BS Psychiatric: appropriate- less interactive; a little more flat today Neurological: Ox3 Skin; dry skin on arms/legs- cannot assess buttocks today Prior exams: MS: less TTP in R knee- effusion slightly better; B/ hand shaking- due to weakness sin hands/Ue's.  Neuro: Alert and oriented x 3. Normal insight and awareness. Intact Memory. Normal language and speech. Cranial nerve exam unremarkable. Stocking glove sensory loss BLE L>>R. BUE both hands/wrists. UE strength 4/5 except distal is 4-/5 LE's- HF 2+/5; KE 4-/5; DF 3+ to 4-/5 and PF 4- to 4/5 B/L  Skin: multiple IV's and blood draw tape areas- MS: R knee- still has mild effusion- TTP diffusely.    Assessment/Plan: 1. Functional deficits which require 3+ hours per day of interdisciplinary therapy in a comprehensive inpatient rehab  setting. Physiatrist is providing close team supervision and 24 hour management of active medical problems listed below. Physiatrist and rehab team continue to assess barriers to discharge/monitor patient progress toward functional and medical goals  Care Tool:  Bathing    Body parts bathed by patient: Right arm, Left arm, Chest, Abdomen, Front perineal area, Right upper leg, Left upper leg, Face   Body parts bathed by helper: Buttocks, Right lower leg, Left lower leg     Bathing assist Assist Level: Moderate Assistance - Patient 50 - 74%     Upper Body Dressing/Undressing Upper body dressing   What is the patient wearing?: Pull over shirt    Upper body assist Assist Level: Set up assist    Lower Body Dressing/Undressing Lower body dressing      What is the patient wearing?: Underwear/pull up, Pants     Lower body assist Assist for lower body dressing: Maximal Assistance - Patient 25 - 49%     Toileting Toileting    Toileting assist Assist for toileting: 2 Helpers     Transfers Chair/bed transfer  Transfers assist     Chair/bed transfer assist level: Contact Guard/Touching assist     Locomotion Ambulation   Ambulation assist   Ambulation activity did not occur:  Safety/medical concerns  Assist level: 2 helpers Assistive device: Lite Gait Max distance: 32   Walk 10 feet activity   Assist  Walk 10 feet activity did not occur: Safety/medical concerns    Assistive device: Lite Gait   Walk 50 feet activity   Assist Walk 50 feet with 2 turns activity did not occur: Safety/medical concerns         Walk 150 feet activity   Assist Walk 150 feet activity did not occur: Safety/medical concerns         Walk 10 feet on uneven surface  activity   Assist Walk 10 feet on uneven surfaces activity did not occur: Safety/medical concerns         Wheelchair     Assist Is the patient using a wheelchair?: Yes Type of Wheelchair: Power     Wheelchair assist level: Supervision/Verbal cueing Max wheelchair distance: 150    Wheelchair 50 feet with 2 turns activity    Assist        Assist Level: Supervision/Verbal cueing   Wheelchair 150 feet activity     Assist      Assist Level: Supervision/Verbal cueing   Blood pressure 137/67, pulse 75, temperature 98.4 F (36.9 C), resp. rate 18, height 5' 4"$  (1.626 m), weight 129 kg, last menstrual period 08/17/2012, SpO2 98 %.  Medical Problem List and Plan: 1. 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 More training with chair so she can get out of tight spaces  SNF still plan  Con't CIR PT and OT- still trying to place in SNF- search has expanded 2.  Antithrombotics: -DVT/anticoagulation:  Pharmaceutical: Lovenox start 1/17             -antiplatelet therapy: none   3. 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 Changed flexeril to 10 mg TID- not prn  2/12- pain gradually improving- con't regimen 4.  Mood/Behavior/Sleep: LCSW to evaluate and provide emotional support             -antipsychotic agents: n/a   1/24- anxiety is limiting therapy- will add Buspar and Celexa for anxiety  1/28 a little frustrated by ongoing deficits  1/30- thinks need SNF due to deficits- frustrated by this  2/1- will increase Buspar  2/7- increased Celexa to 40 mg daily 5. Neuropsych/cognition:  This patient is capable of making decisions on her own behalf.   6. Skin/Wound Care: Routine skin care checks   7. Fluids/Electrolytes/Nutrition: Routine Is and Os and follow-up chemistries             -protein supplements for hypoalbuminemia             -continue folic acid, [id number]             -continue MVI   1/18- will check with pharmacy if needs B12 shots AND daily pills.   8: Chronic back pain/radiculopathy:  -continue Dilaudid 4 mg q 6 hours - is her home medicine (PTA per Dr. [name])             -continue gabapentin 600 mg TID             -continue Lidoderm patch daily             -continue Flexeril 10 mg TID prn   1/26- will d/c Lidoderm- not using.  Otherwise, pain controlled- con't regimen 9: Urinary retention: Foley replaced on 1/15             -treated for UTI with Rocephin for  7 days   1/18- will start Flomax 0.4 mg qsupper and will try and remove Foley in AM- explained will need in/out caths likely before bladder muscle kicks in and she can void.   1/19- foley out this Am at 6am- hasn't voided yet- added bladder scans q6 hours and then cath of volumes /350cc- pt made aware can take a few days to kick in/the flomax. Has voided some and cathed x1-   1/20- no more caths require-d voiding on her own- con't flomax  1/22- d/c bladder scans since voiding well- only required 1 cath since foley removed fyi.   1/23- voiding well- d/c bladder scans  2/6- resolved? But Cr up to 2.29- and BUN 26-   2/7- off Lasix  2/8- Cr 1.21 and Bun 27- sotpped IVFs  2/12- labs pending 10: Hypertension: monitor TID and  prn             -continue Lasix 40 mg daily             -continue Lopressor 50 mg BID             -add magnesium gluconate 251m HS   1/22- BP controlled- con't regimen  1/29- Lasix 40 mg BID- but home dose 40 mg daily- since Cr 1.26 up from 0.75 last week, will reduce to daily. And recheck Thursday  2/1- new Cr 1.10- and BUN better at 17- from 22- will recheck Monday- sinc eon lower dose of Lasix- no increase in LE edema  2/6- Cr up to 2.29- will recheck today and in AM- says she's drinking- will recheck today and if still elevated, we need IVFs 75cc/hour. If is still elevated, will also stop Lasix for now.   2/7- down to 1.82- will do IVF's another 24 hours and recheck in AM  2/8- will stop IVFs- off Lasix- no LE edema- needs weight- will order  2/10 elevated, possibly pain related, continue to monitor for now  2/11 BP improved, may be related to pain improving, continue to monitor  2/12- BP controlled- con't regimen    10/28/2022    4:16 AM 10/27/2022    7:39 PM 10/27/2022    3:00 PM  Vitals with BMI  Systolic [id number] Diastolic 67 70 89  Pulse 75 80  73     11: GERD?/GI prophylaxis: continue Protonix   12: B12 deficiency: continue supplementation   1/19- says itches with B12 shots, but just takes benadryl 13: Class 3 obesity: BMI = 48; weight loss counseling   1/24- BMI down to 46.62  2/1- BMI 47.15  2/7- pt eating a lot of junk food per staff- 1 lb M&M's eaten in 2 days; having a lot of fast food brought by husband 14: Hyponatremia, mild: follow-up BMP   2/1- Na 137- doing better  2/12- labs pending 15: Elevated serum creatinine/AKI; 0.75 1/16 to 1.06 on 1/17             -follow-up BMP   1/18- stable- con't to monitor weekly and prn  1/29- Cr 1.16- from 0.75- will reduce Lasix  2/1- labs today- Cr 1.10- and no LE edema on exam  2/2- not drinking- per pt- hates water- encouraged Mio/to add flavor to water and to drink 6-8 cups/day.   2/7- Cr 1.82 down from 2.29 and BUN  37- off Lasix as of this AM- will give another 24 hours of IVFs at 75cc/hour and recheck in AM  2/8- stopped IVFs since Cr down to 1.20 and BUN down to 27- wil recheck in AM to make sure stays down. 2/9- Cr 0.99 and BUN 18- no IVFs in last 24 hours- so happy with results- encouraged pt to keep drinking water. Recheck Monday   2/12- labs pending 16: GBS: continue to monitor NIFs/VIC q 12 hours             -continue gabapentin   1/19- will increase gabapaentin to 900 mg TID  1/23- slightly better- con't regimen  2/1- tingling/burning is gone on Gabapentin  2/4: NIFs -40   2/7- NIF's stable at -40  2/8- will stop Vital capacity and NIF since been good for 1 week- will restart if Sx's get worse.  17: Glaucoma: continue latanoprost eye drops   18: Tobacco use: cessation counseling   19. Constipation  1/18- will con't Senna per pt request- to not increase- but if no BM by tomorrow, will try Sorbitol.   2/2 regular BM's per pt 2/3 - large BM  2/6- LBM 2 days ago- if no result by tomorrow, will give Sorbitol  2/9- finally got cleaned out last night after Mylanta-  20.. insomnia  1/20- will start Melatonin 5 mg QHS prn- don't want trazodone since can cause urinary retention  1/22- sleeping a little better 21. Anxiety  1/24- added Buspar 5 mg TID- will also add Celexa for mood/anxiety prevention 20 mg daily. 1/25- no side effects so far.    1/26-27- feels anxiety is do ing MUCH better per pt. 2/1- anxiety still limiting therapy per PT/OT- will increase Buspar to 10 mg TID 2/7- pt says anxiety still limiting- so does therapy- will increase Celexa to 40 mg daily.  22. R knee buckling/pain  2/1- got brace- wasn't real effective/"is thin"- I educated pt another type knee brace would be too heavy for her to lift with her weakness- will call ortho to see if any ideas since hurting MORE overnight- R knee- -ever since fall over weekend on R knee directly.   2/2- Xray just shows severe knee DJD- need  for Knee replacement- no fractures- will add Diclofenac 50 mg TID x 5 days to help pain. Since her renal function slightly impaired (not drinking water), will not do longer than 5 days.   2/9- pain has been doing bette  rin R knee 23. Hyperkalemia  2/7- K+ down to 5.1- if still elevated tomorrow, then will use Lokelma  2/9- K+ 4.9-   2/9- K+ 5.0- pt doesn't want Loklema since just "got cleaned out".   Recheck tomorrow 24. Abd pain  2/9- given mylanta- but had Bms "all night"- so likely was full of stool causing abd pain- eating more than meals from hospital- a lot of snacks from outside.   2/10 improving, had multiple bowel movements this morning  2/11 Additional BM today, improved 25. Dry skin  2/12- added eucerin for dry skin BID  LOS: 26 days A FACE TO FACE EVALUATION WAS PERFORMED  Dana [name] 10/28/2022, 8:22 AM

## 2022-10-28 NOTE — Progress Notes (Signed)
Patient ID: Dana [name], female   DOB: 17-Oct-1965, 57 y.o.   MRN: [id number]  Spoke with [facility] and [facility] to see if could offer a bed. Both are having nursing review clinicals and will get back with worker.

## 2022-10-28 NOTE — Progress Notes (Addendum)
Physical Therapy Session Note  Patient Details  Name: Dana [name] MRN: [id number] Date of Birth: 05/18/1966  Today's Date: 10/28/2022 PT Individual Time: 1100-1200, 1415-1546  PT Individual Time Calculation (min): 60 min , 91 min   Short Term Goals: Week 4:  PT Short Term Goal 1 (Week 4): Pt will consisntely require CGA with STS with LRAD PT Short Term Goal 2 (Week 4): Pt will require min A for stand pivot transfer with LRAD PT Short Term Goal 3 (Week 4): Pt will require (S) for dynamic standing with LRAD  Skilled Therapeutic Interventions/Progress Updates:      Therapy Documentation Precautions:  Precautions Precautions: Fall Precaution Comments: weakness, numbness L anterior thigh. Restrictions Weight Bearing Restrictions: No  Treatment Session 1:   Pt received seated in PWC at bedside, agreeable to PT session with emphasis on UE activity tolerance/coordination and gait training.   PT educated pt on boxing punches of jab, cross, hook and uppercut. Pt performed various combinations to address activity tolerance and UE coordination deficits 3 x 30 seconds.   Pt transitioned to gait training with HDRW and close chair follow. Pt requires CGA with sit to stand and min A with gait 13 ft + 10 ft +10 ft with seated rest breaks in between each bout. Pt requires min tactile cueing for hand placement on AD due to proprioceptive deficits.   Pt navigated PWC to room and SBA with stedy transfer to recliner. Pt left seated at bedside with all needs in reach and alarm on.    Treatment Session 2:   Pt agreeable to PT session with emphasis on LE coordination and transfer training.   Pt navigated PWC to main gym supervision for safety. Pt requires min A with stand pivot transfer with RW with verbal cues for sequencing.   Pt requires mod A with supine<>sit on mat table and performed following LE exercises to address strength and coordination deficits:   -bridges 3 x 6   -modified leg  press 1 x 6 + 1 x 3   -supine hip abduction bilaterally 3 x 8   Pt reported back pain and provided with repositioning for relief and ice pack following session.   Pt mod A with sit to supine with verbal cues for sequencing. Pt navigated PWC to room and negotiated narrow spaces and performed dynamic reaching for objects throughout room.   Pt left seated in PWC at bedside with all needs in reach. Pt declines chair alarm.   Therapy/Group: Individual Therapy  Verl Dicker Verl Dicker PT, DPT  10/28/2022, 7:40 AM

## 2022-10-28 NOTE — Progress Notes (Signed)
Occupational Therapy Session Note  Patient Details  Name: Dana [name] MRN: [id number] Date of Birth: [birth year]  Today's Date: 10/28/2022 OT Individual Time: 0905-1004 OT Individual Time Calculation (min): 59 min    Short Term Goals: Week 3:  OT Short Term Goal 2 (Week 3): STGs=LTGs due to patient's length of stay.  Skilled Therapeutic Interventions/Progress Updates:  Pt received sitting in [facility] for skilled OT session with focus on BADL retaining and functional transfers. Pt agreeable to interventions, demonstrating overall pleasant mood. Pt with un-rated pain, stating "it's a lot better now." OT offering intermediate rest breaks and positioning suggestions throughout session to address pain/fatigue and maximize participation/safety in session.   Pt performs lateral transfers from PWC>EOB with Mod A + Mod multimodal cuing for technique. Sitting EOB, pt educated on use of reacher for LB garment threading, pt completing threading of underwear/pants with supervision. Pt then standing with Min A + HDRW, requiring total A for donning of LB garments over hips/bottom. Pt requires Max A to don shoes, but able to use reacher to line shoe up with foot and push heel into shoe.   Pt performs side step transfer from EOB>PWC with Min A + HDRW.   Pt self-drives and maneuvers PWC to therapy gym with Mod VC for object avoidance. In therapy gym, pt's tennis shoes adapted with elastic shoe-strings for improved independence with shoe donning.    Pt remained siting in [facility] with all immediate needs met at end of session. Pt continues to be appropriate for skilled OT intervention to promote further functional independence.   Therapy Documentation Precautions:  Precautions Precautions: Fall Precaution Comments: weakness, numbness L anterior thigh. Restrictions Weight Bearing Restrictions: No   Therapy/Group: Individual Therapy  Maudie Mercury, OTR/L, MSOT  10/28/2022, 7:55 AM

## 2022-10-29 NOTE — Progress Notes (Signed)
Physical Therapy Session Note  Patient Details  Name: Dana [name] MRN: [id number] Date of Birth: 08-27-1966  Today's Date: 10/29/2022 PT Individual Time: [id number] PT Individual Time Calculation (min): 45 min   Short Term Goals: Week 3:  PT Short Term Goal 1 (Week 3): Pt will require CGA for STS PT Short Term Goal 1 - Progress (Week 3): Met PT Short Term Goal 2 (Week 3): Pt will require CGA for stand pivot transfer PT Short Term Goal 2 - Progress (Week 3): Not met PT Short Term Goal 3 (Week 3): Pt will require CGA with LRAD x 30 ft PT Short Term Goal 3 - Progress (Week 3): Not met  Skilled Therapeutic Interventions/Progress Updates:    Pt received in [place], agreeable to therapy. Pt reports R knee pain with mobility that improved with rest, NT to provide ice pack at end of session.  Pt navigated PWC with supervision and occasional cueing for putting leg rests in appropriate position, donning/doffing seat belt. Pt required assist to plug in chair at end of session.  Pt performed Sit to stand with CGA and bari RW several times during session, limited by fatigue and knee pain. Pt also performed Stand pivot transfer with CGA and bar RW. Side stepping 3 x 3 ft with seated rest breaks for improved Stand pivot transfer independence.  On return to room, pt performed CGA ambulatory transfer to recliner, was left with all needs in reach and alarm active.   Therapy Documentation Precautions:  Precautions Precautions: Fall Precaution Comments: weakness, numbness L anterior thigh. Restrictions Weight Bearing Restrictions: No General:       Therapy/Group: Individual Therapy  Mickel Fuchs 10/29/2022, 12:18 PM

## 2022-10-29 NOTE — Patient Care Conference (Signed)
Inpatient RehabilitationTeam Conference and Plan of Care Update Date: 10/29/2022   Time: 11:28 AM    Patient Name: Dana [name]      Medical Record Number: [id number]  Date of Birth: 1966-05-19 Sex: Female         Room/Bed: 4W04C/4W04C-01 Payor Info: Payor: Theme park manager / Plan: [employer] / Product Type: *No Product type* /    Admit Date/Time:  10/02/2022  1:50 PM  Primary Diagnosis:  Guillain Barr syndrome [facility])  Hospital Problems: Principal Problem:   Guillain Barr syndrome [facility])    Expected Discharge Date: Expected Discharge Date:  (SNF)  Team Members Present: Physician leading conference: Dr. Courtney Heys Social Worker Present: Ovidio Kin, LCSW Nurse Present: Tacy Learn, RN PT Present: Verl Dicker, PT OT Present: Other (comment) Maudie Mercury, OT) PPS Coordinator present : Gunnar Fusi, SLP     Current Status/Progress Goal Weekly Team Focus  Bowel/Bladder   Pt continent of B/B. LBM 10/28/2022   Regular BMs every 3 days or less   Assess B/B every shift and PRN    Swallow/Nutrition/ Hydration               ADL's   (P) Progressing to stand-step with Min A + HDRW for functional transfers, LB dressing/bathing Mod A with AE, UB mod I   Directing use of lift equipment and overall Mod A + LRAD   Functional transfers, toileting, and LB ADLs    Mobility   min/med bed, CGA STS, gait x 13 ft (S) bed,  PWC navigation CGA-S   min A / CGA  transfers, gait, pt/fam education, coordination/balance, d/c planning    Communication                Safety/Cognition/ Behavioral Observations               Pain   Rates chronic generalized pain 7/10. Take PRN hydromorphone   Pain rating <3/10   Assess pain every shift and PRN    Skin   Complains of pruiritis to buttocks. Eucerin lotion PRN   No new skin breakdown  Assess skin every shift and PRN      Discharge Planning:  Searching for NH bed-difficulty is pt's young age,  weight and insurance. Pt and husband aware may not get approved once bed found-private duty list given to husband and encouraged to discuss care with chidlren/family due to pt will continue to require care after SNF stay   Team Discussion: GBS. Continent B/B. Pain managed with PRN medications. Eucerin cream for dry skin. Encourage fluids. Weights weekly.  Patient on target to meet rehab goals: yes, Min/Mod bed mobility. CGA STS. Gait 13'. PWC CGA/S with navigation. Stand/step MinA with HDRW for functional transfers. LB dressing/bathing ModA with AE. UB ModI.  *See Care Plan and progress notes for long and short-term goals.   Revisions to Treatment Plan:  Medication adjustments, monitor weight weekly  Teaching Needs: Medications, safety, self care, gait/transfer training, etc.   Current Barriers to Discharge: Decreased caregiver support, Lack of/limited family support, Insurance for SNF coverage, and Weight  Possible Resolutions to Barriers: Family education, nursing education, order recommended DME     Medical Summary Current Status: back pain better- c/o chronic pain- added eucerin for dry skin- conitnent B/B  Barriers to Discharge: Behavior/Mood;Morbid Obesity  Barriers to Discharge Comments: pain is chronic- min A stand pivot to walker- insists needs SNF, even though doing better- walked 13 ft with RW per PT Possible Resolutions to  Barriers/Weekly Focus: trying to get 3 offers- for SNF- and not sure if insurance will cover min-mod A for LB dressing/bathing- using AE- - d/c- to SNF or home depending on insurance   Continued Need for Acute Rehabilitation Level of Care: The patient requires daily medical management by a physician with specialized training in physical medicine and rehabilitation for the following reasons: Direction of a multidisciplinary physical rehabilitation program to maximize functional independence : Yes Medical management of patient stability for increased activity  during participation in an intensive rehabilitation regime.: Yes Analysis of laboratory values and/or radiology reports with any subsequent need for medication adjustment and/or medical intervention. : Yes   I attest that I was present, lead the team conference, and concur with the assessment and plan of the team.   Ernest Pine 10/29/2022, 4:06 PM

## 2022-10-29 NOTE — Progress Notes (Signed)
Occupational Therapy Weekly Progress Note  Patient Details  Name: CHANNY KNIBBS MRN: [id number] Date of Birth: 1965-12-22  Beginning of progress report period: October 21, 2022 End of progress report period: October 29, 2021  Today's Date: 10/29/2022 OT Individual Time: [id number] OT Individual Time Calculation (min): 58 min   Today's Date: 10/29/2022 OT Individual Time: 1450-1535 OT Individual Time Calculation (min): 45 min   Patient continues to make steady progress towards LTGs this reporting period. Pt able to walk with Min-Mod A + HDRW towards beginning of reporting period, unfortunately greatly limited by pain with continued ambulation. Pt overall set-up assist level for UB ADLs, continuing to require closer to Mod A for LB ADLs, but improving with use of AE. Pt's discharge plan continuing to be SNF.   Patient continues to demonstrate the following deficits: muscle weakness, decreased cardiorespiratoy endurance, and decreased standing balance, decreased postural control, decreased balance strategies, and increased pain  and therefore will continue to benefit from skilled OT intervention to enhance overall performance with BADLs and Reduce care partner burden.  Patient progressing toward long term goals..  Continue plan of care.  OT Short Term Goals Week 4:  OT Short Term Goal 1 (Week 4): STGs=LTGs due to patient's length of stay.  Skilled Therapeutic Interventions/Progress Updates:  Session 1: Pt received sitting EOB eating breakfast for skilled OT session with focus on BADL retraining. Pt agreeable to interventions, despite demonstrating overall low mood. Pt reported 10/10 pain, stating "it's in the same spot on my back", pt medicated during session. OT offering intermediate rest breaks and positioning suggestions throughout session to address pain/fatigue and maximize participation/safety in session.   Pt doffs nightgown with Min A for material management from under bottom due  to increased pain with lateral leans.   Pt performs full-body sponge bathing sitting EOB with setup A for UB and Min-Mod A for lower legs and feet, pt stating "I can't use that thing" in reference to long-handled sponge for LB bathing. OT to adapt long-handled sponge for improved grip. Pt requesting privacy to perform peri-area cleaning, becoming frustrated with increased difficulty, but able to perform when bridged on bed and HOB reclined.  Pt requires Max A for LB dressing and Min A for UB dressing due to time constraints. Pt performs EOB>PWC transfer with use of stedy and overall CGA.   Pt remained sitting in [facility]  with all immediate needs met at end of session. Pt continues to be appropriate for skilled OT intervention to promote further functional independence.   Session 2: Pt received sitting in PWC for skilled OT session with focus on community mobility and UE strengthening/activity tolerance. Pt agreeable to interventions, demonstrating overall pleasant mood. Pt with un-rated pain, pre-medicated. OT offering intermediate rest breaks and positioning suggestions throughout session to address pain/fatigue and maximize participation/safety in session.   Pt self drives/maneuvers PWC at community-level to hospital gift-shop. Pt continuing to require min verbal cuing for object avoidance. Pt demonstrating improved FM functioning as seen through patient effectively manipulating credit card and signing name to pay for item purchased.  In day room, pt participates in series of UE strengthening exercises: -Chest Press -Overhead Press -Bicep Curls Pt performs 2 sets/10 reps with 4# dowel bar, requiring multimodal cuing for correct form.   Pt then completes 2 sets/10 reps of beach ball toss with 4# dowel bar, resting intermediately.   Pt remained sitting in [place] with all immediate needs met at end of session. Pt continues to be appropriate  for skilled OT intervention to promote further functional  independence.    Therapy Documentation Precautions:  Precautions Precautions: Fall Precaution Comments: weakness, numbness L anterior thigh. Restrictions Weight Bearing Restrictions: No   Therapy/Group: Individual Therapy  Maudie Mercury, OTR/L, MSOT  10/29/2022, 7:56 AM

## 2022-10-29 NOTE — Progress Notes (Signed)
Patient ID: Dana [name], female   DOB: 13-Feb-1966, 57 y.o.   MRN: [id number] Spoke with husband on telephone and met with pt in her room to discuss team conference progress this week and the four bed offers she has. Gave her hand out and told husband on phone while he wrote them down. He also asked for home health agency list which was emailed to him. He reports he did call some of the private duty agencies and they told him to call home health agencies. Tried to make clear pt is eligible for home health but will also need what's called custodial care. Someone to be there to assist with her care. This worker's concern is pt will still need care once leaves the SNF depending upon how long her insurance will cover for her to stay there. Have asked both to choose a SNF offer and will then need to see if insurance will cover. Pt plans to talk with husband when here tonight.

## 2022-10-29 NOTE — Progress Notes (Signed)
PROGRESS NOTE   Subjective/Complaints:  Pt reports back pain gradually improving.  Exercise with Vernona Rieger made back hurt more- they stopped doing.  Pulled at back muscles.    ROS:  Pt denies SOB, abd pain, CP, N/V/C/D, and vision changes   Except for HPI Objective:   No results found. Recent Labs    10/28/22 0818  WBC 9.9  HGB 11.4*  HCT 36.4  PLT 319      Recent Labs    10/28/22 0818  NA 136  K 4.6  CL 103  CO2 26  GLUCOSE 110*  BUN 16  CREATININE 1.12*  CALCIUM 9.1     Intake/Output Summary (Last 24 hours) at 10/29/2022 [id number] Last data filed at 10/28/2022 1836 Gross per 24 hour  Intake 476 ml  Output --  Net 476 ml        Physical Exam: Vital Signs Blood pressure (!) 143/73, pulse 68, temperature 97.8 F (36.6 C), resp. rate 18, height 5' 4"$  (1.626 m), weight 129 kg, last menstrual period 08/17/2012, SpO2 100 %.         General: awake, alert, appropriate, supine in bed; just woke up; hasn't eaten meal yet; NAD HENT: conjugate gaze; oropharynx dry CV: regular rate; no JVD Pulmonary: CTA B/L; no W/R/R- good air movement GI: soft, NT, ND, (+)BS Psychiatric: appropriate- flat; still slightly anxious Neurological: Ox3  Skin; dry skin on arms/legs- cannot assess buttocks today Prior exams: MS: less TTP in R knee- effusion slightly better; B/ hand shaking- due to weakness sin hands/Ue's.  Neuro: Alert and oriented x 3. Normal insight and awareness. Intact Memory. Normal language and speech. Cranial nerve exam unremarkable. Stocking glove sensory loss BLE L>>R. BUE both hands/wrists. UE strength 4/5 except distal is 4-/5 LE's- HF 2+/5; KE 4-/5; DF 3+ to 4-/5 and PF 4- to 4/5 B/L  Skin: multiple IV's and blood draw tape areas- MS: R knee- still has mild effusion- TTP diffusely.    Assessment/Plan: 1. Functional deficits which require 3+ hours per day of interdisciplinary therapy in a  comprehensive inpatient rehab setting. Physiatrist is providing close team supervision and 24 hour management of active medical problems listed below. Physiatrist and rehab team continue to assess barriers to discharge/monitor patient progress toward functional and medical goals  Care Tool:  Bathing    Body parts bathed by patient: Right arm, Left arm, Chest, Abdomen, Front perineal area, Right upper leg, Left upper leg, Face   Body parts bathed by helper: Buttocks, Right lower leg, Left lower leg     Bathing assist Assist Level: Moderate Assistance - Patient 50 - 74%     Upper Body Dressing/Undressing Upper body dressing   What is the patient wearing?: Pull over shirt    Upper body assist Assist Level: Set up assist    Lower Body Dressing/Undressing Lower body dressing      What is the patient wearing?: Underwear/pull up, Pants     Lower body assist Assist for lower body dressing: Moderate Assistance - Patient 50 - 74%     Toileting Toileting    Toileting assist Assist for toileting: 2 Helpers     Transfers Chair/bed transfer  Transfers assist     Chair/bed transfer assist level: Contact Guard/Touching assist     Locomotion Ambulation   Ambulation assist   Ambulation activity did not occur: Safety/medical concerns  Assist level: 2 helpers Assistive device: Lite Gait Max distance: 32   Walk 10 feet activity   Assist  Walk 10 feet activity did not occur: Safety/medical concerns    Assistive device: Lite Gait   Walk 50 feet activity   Assist Walk 50 feet with 2 turns activity did not occur: Safety/medical concerns         Walk 150 feet activity   Assist Walk 150 feet activity did not occur: Safety/medical concerns         Walk 10 feet on uneven surface  activity   Assist Walk 10 feet on uneven surfaces activity did not occur: Safety/medical concerns         Wheelchair     Assist Is the patient using a wheelchair?:  Yes Type of Wheelchair: Power    Wheelchair assist level: Supervision/Verbal cueing Max wheelchair distance: 150    Wheelchair 50 feet with 2 turns activity    Assist        Assist Level: Supervision/Verbal cueing   Wheelchair 150 feet activity     Assist      Assist Level: Supervision/Verbal cueing   Blood pressure (!) 143/73, pulse 68, temperature 97.8 F (36.6 C), resp. rate 18, height 5' 4"$  (1.626 m), weight 129 kg, last menstrual period 08/17/2012, SpO2 100 %.  Medical Problem List and Plan: 1. 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 More training with chair so she can get out of tight spaces  Con't CIR PT and OT  Expanding SNF search  Team conference today to f/u on progress 2.  Antithrombotics: -DVT/anticoagulation:  Pharmaceutical: Lovenox start 1/17             -antiplatelet therapy: none   3. 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 Changed flexeril to 10 mg TID- not prn  2/13- Back pain gradually  improving- did an exercise with therapy that hurt back- was stopped.  4. Mood/Behavior/Sleep: LCSW to evaluate and provide emotional support             -antipsychotic agents: n/a   1/24- anxiety is limiting therapy- will  add Buspar and Celexa for anxiety  1/28 a little frustrated by ongoing deficits  1/30- thinks need SNF due to deficits- frustrated by this  2/1- will increase Buspar  2/7- increased Celexa to 40 mg daily 5. Neuropsych/cognition: This patient is capable of making decisions on her own behalf.   6. Skin/Wound Care: Routine skin care checks   7. Fluids/Electrolytes/Nutrition: Routine Is and Os and follow-up chemistries             -protein supplements for hypoalbuminemia             -continue folic acid, [id number]             -continue MVI   1/18- will check with pharmacy if needs B12 shots AND daily pills.   8: Chronic back pain/radiculopathy:  -continue Dilaudid 4 mg q 6 hours - is her home medicine (PTA per Dr. [name])             -continue gabapentin 600 mg TID             -continue Lidoderm patch daily             -continue Flexeril 10 mg TID prn   1/26- will d/c Lidoderm- not using.  Otherwise, pain controlled- con't regimen 9: Urinary retention: Foley replaced on 1/15             -treated for UTI with Rocephin for  7 days   1/18- will start Flomax 0.4 mg qsupper and will try and remove Foley in AM- explained will need in/out caths likely before bladder muscle kicks in and she can void.   1/19- foley out this Am at 6am- hasn't voided yet- added bladder scans q6 hours and then cath of volumes /350cc- pt made aware can take a few days to kick in/the flomax. Has voided some and cathed x1-   1/20- no more caths require-d voiding on her own- con't flomax  1/22- d/c bladder scans since voiding well- only required 1 cath since foley removed fyi.   1/23- voiding well- d/c bladder scans  2/6- resolved? But Cr up to 2.29- and BUN 26-   2/7- off Lasix  2/8- Cr 1.21 and Bun  27- sotpped IVFs  2/12- labs pending  2/13- Cr 1.12 and BUN 16-  10: Hypertension: monitor TID and prn             -continue Lasix 40 mg daily             -continue Lopressor 50 mg BID             -add magnesium gluconate 22m HS   1/22- BP controlled- con't regimen  1/29- Lasix 40 mg BID- but home dose 40 mg daily- since Cr 1.26 up from 0.75 last week, will reduce to daily. And recheck Thursday  2/1- new Cr 1.10- and BUN better at 17- from 22- will recheck Monday- sinc eon lower dose of Lasix- no increase in LE edema  2/6- Cr up to 2.29- will recheck today and in AM- says she's drinking- will recheck today and if still elevated, we need IVFs 75cc/hour. If is still elevated, will also stop Lasix for now.   2/7- down to 1.82- will do IVF's another 24 hours and recheck in AM  2/8- will stop IVFs- off Lasix- no LE edema- needs weight- will order  2/10 elevated, possibly pain related, continue to monitor for now  2/11 BP improved, may be related to pain improving,  continue to monitor  2/12- BP controlled- con't regimen    10/29/2022    5:29 AM 10/28/2022    7:40 PM 10/28/2022    3:47 PM  Vitals with BMI  Systolic [id number]  Diastolic 73 63 68  Pulse 68 74 69     11: GERD?/GI prophylaxis: continue Protonix   12: B12 deficiency: continue supplementation   1/19- says itches with B12 shots, but just takes benadryl 13: Class 3 obesity: BMI = 48; weight loss counseling   1/24- BMI down to 46.62  2/1- BMI 47.15  2/7- pt eating a lot of junk food per staff- 1 lb M&M's eaten in 2 days; having a lot of fast food brought by husband 14: Hyponatremia, mild: follow-up BMP   2/1- Na 137- doing better  2/12- labs pending 15: Elevated serum creatinine/AKI; 0.75 1/16 to 1.06 on 1/17             -follow-up BMP   1/18- stable- con't to monitor weekly and prn  1/29- Cr 1.16- from 0.75- will reduce Lasix  2/1- labs today- Cr 1.10- and no LE edema on exam  2/2- not drinking- per pt- hates water-  encouraged Mio/to add flavor to water and to drink 6-8 cups/day.   2/7- Cr 1.82 down from 2.29 and BUN 37- off Lasix as of this AM- will give another 24 hours of IVFs at 75cc/hour and recheck in AM  2/8- stopped IVFs since Cr down to 1.20 and BUN down to 27- wil recheck in AM to make sure stays down. 2/9- Cr 0.99 and BUN 18- no IVFs in last 24 hours- so happy with results- encouraged pt to keep drinking water. Recheck Monday  2/13- Cr 1.12- is inching back up- BUN stable- will recheck Thursday since is back up some- since 1.12, no intervention right this moment- encourage pt to drink still.  16: GBS: continue to monitor NIFs/VIC q 12 hours             -continue gabapentin   1/19- will increase gabapaentin to 900 mg TID  1/23- slightly better- con't regimen  2/1- tingling/burning is gone on Gabapentin  2/4: NIFs -40   2/7- NIF's stable at -40  2/8- will stop Vital capacity and NIF since been good for 1 week- will restart if Sx's get worse.  17: Glaucoma: continue latanoprost eye drops   18: Tobacco use: cessation counseling   19. Constipation  1/18- will con't Senna per pt request- to not increase- but if no BM by tomorrow, will try Sorbitol.   2/2 regular BM's per pt 2/3 - large BM  2/6- LBM 2 days ago- if no result by tomorrow, will give Sorbitol  2/9- finally got cleaned out last night after Mylanta-  20.. insomnia  1/20- will start Melatonin 5 mg QHS prn- don't want trazodone since can cause urinary retention  1/22- sleeping a little better 21. Anxiety  1/24- added Buspar 5 mg TID- will also add Celexa for mood/anxiety prevention 20 mg daily. 1/25- no side effects so far.    1/26-27- feels anxiety is do ing MUCH better per pt. 2/1- anxiety still limiting therapy per PT/OT- will increase Buspar to 10 mg TID 2/7- pt says anxiety still limiting- so does therapy- will increase Celexa to 40 mg daily.  22. R knee buckling/pain  2/1- got brace- wasn't real effective/"is thin"- I educated  pt another type knee brace would be too heavy for her to lift with her weakness-  will call ortho to see if any ideas since hurting MORE overnight- R knee- -ever since fall over weekend on R knee directly.   2/2- Xray just shows severe knee DJD- need for Knee replacement- no fractures- will add Diclofenac 50 mg TID x 5 days to help pain. Since her renal function slightly impaired (not drinking water), will not do longer than 5 days.   2/9- pain has been doing bette rin R knee 23. Hyperkalemia  2/7- K+ down to 5.1- if still elevated tomorrow, then will use Lokelma  2/9- K+ 4.9-   2/9- K+ 5.0- pt doesn't want Loklema since just "got cleaned out".   Recheck tomorrow  2/13- K+ 4.6 24. Abd pain  2/9- given mylanta- but had Bms "all night"- so likely was full of stool causing abd pain- eating more than meals from hospital- a lot of snacks from outside.   2/10 improving, had multiple bowel movements this morning  2/11 Additional BM today, improved 25. Dry skin  2/12- added eucerin for dry skin BID    I spent a total of  37  minutes on total care today- >50% coordination of care- due to  Team conference and d/w SW about dispo   LOS: 27 days A FACE TO FACE EVALUATION WAS PERFORMED  Chipper Koudelka 10/29/2022, 8:12 AM

## 2022-10-29 NOTE — Progress Notes (Signed)
Physical Therapy Session Note  Patient Details  Name: Dana [name] MRN: [id number] Date of Birth: 06/15/1966  Today's Date: 10/29/2022 PT Individual Time: [id number] PT Individual Time Calculation (min): 45 min   Short Term Goals: Week 4:  PT Short Term Goal 1 (Week 4): Pt will consisntely require CGA with STS with LRAD PT Short Term Goal 2 (Week 4): Pt will require min A for stand pivot transfer with LRAD PT Short Term Goal 3 (Week 4): Pt will require (S) for dynamic standing with LRAD  Skilled Therapeutic Interventions/Progress Updates:      Therapy Documentation Precautions:  Precautions Precautions: Fall Precaution Comments: weakness, numbness L anterior thigh. Restrictions Weight Bearing Restrictions: No  Pt agreeable to PT session with emphasis on PWC mobility and community integration. Pt requires CGA- (S) with min verbal cues for PWC navigation to first floor. Pt practiced performing in turns and negotiating PWC in narrow environments in hospital community gift shop. Pt requires supervision for dynamic reaching ipsilateral and contralateral for object retrieval. Pt left seated in PWC at bedside with all needs in reach.    Therapy/Group: Individual Therapy  Verl Dicker Verl Dicker PT, DPT  10/29/2022, 7:41 AM

## 2022-10-29 NOTE — Progress Notes (Signed)
Occupational Therapy Session Note  Patient Details  Name: Dana [name] MRN: [id number] Date of Birth: 02-04-1966  Today's Date: 10/29/2022 OT Individual Time: 1345-1415 OT Individual Time Calculation (min): 30 min    Short Term Goals: Week 3:  OT Short Term Goal 2 (Week 3): STGs=LTGs due to patient's length of stay. OT Short Term Goal 2 - Progress (Week 3): Progressing toward goal  Skilled Therapeutic Interventions/Progress Updates:   Pt seen for skilled OT session with focus on functional transfers and mobility and light activity tolerance with UE restorator. Pt performed sit to stand with HDRW from recliner with mod fading to min A when appropriately set up for transfer and proper hand placement. Amb ~ 9 ft with min A to [name] placed along side of sink for increased distance and maneuvering practice with walker. Pt navigated to and from middle gym space from room with close S and min cues x 2 trials with obstacle negotiation. Pt able to perform B UE restorator forward and back x 4 sets with low resistance and no report of pain. Once back in room, reset back up with needs and nurse call button in place.   Therapy Documentation Precautions:  Precautions Precautions: Fall Precaution Comments: weakness, numbness L anterior thigh. Restrictions Weight Bearing Restrictions: No    Therapy/Group: Individual Therapy  Barnabas Lister 10/29/2022, 7:40 AM

## 2022-10-30 NOTE — Plan of Care (Signed)
  Problem: RH SKIN INTEGRITY Goal: RH STG MAINTAIN SKIN INTEGRITY WITH ASSISTANCE Description: STG Maintain Skin Integrity With min Assistance. Outcome: Not Progressing; c/o itching on her buttocks; claims she needs hypoallergenic sheets; ordered.

## 2022-10-30 NOTE — Progress Notes (Addendum)
Patient ID: Dana [name], female   DOB: 1966-09-15, 57 y.o.   MRN: [id number]  Met with pt to discuss if she and husband have decided upon a bed offer. She hs not discussed with him. Husband is expected up here but not here yet, so called him and left a voice mail for him to return my call.  2:52 PM Met with husband, pt and pt's sister who is present in her room to discuss the options and see if has made a decision. He reports she doesn't like any of them and does not plan for her to go to one]. He wants to expand the search informed him have expanded to surrounding towns and this is all the offers made. He states: " She is not going to a crappy place."  He wants to know if can stay a little bit longer to be able to ambulate further so could get to the bathroom and back. Have reached out to the team and MD regarding this. He is aware if denies the options only option is to go home and discussed coming in for education, which he did not commit to. Again discussed talking with family members but told it is only husband and mother in-law. Will await team input. Husband and pt aware she may be going home sooner than she would like to go. Husband is convinced home health will offer more care than they will.

## 2022-10-30 NOTE — Progress Notes (Signed)
PROGRESS NOTE   Subjective/Complaints:  Pt reports woke up feeling OK- Back hurts a little, but tolerable.  LBM yesterday Cream working for dry skin- much better.   Per therapy, NOT drinking well again.   ROS:  Pt denies SOB, abd pain, CP, N/V/C/D, and vision changes   Except for HPI Objective:   No results found. Recent Labs    10/28/22 0818  WBC 9.9  HGB 11.4*  HCT 36.4  PLT 319      Recent Labs    10/28/22 0818  NA 136  K 4.6  CL 103  CO2 26  GLUCOSE 110*  BUN 16  CREATININE 1.12*  CALCIUM 9.1     Intake/Output Summary (Last 24 hours) at 10/30/2022 0801 Last data filed at 10/29/2022 2021 Gross per 24 hour  Intake 700 ml  Output --  Net 700 ml        Physical Exam: Vital Signs Blood pressure 115/66, pulse 69, temperature 97.8 F (36.6 C), temperature source Oral, resp. rate 18, height 5' 4"$  (1.626 m), weight 129 kg, last menstrual period 08/17/2012, SpO2 99 %.         General: awake, alert, appropriate, sitting EOB; took a lot to get there, but did on her own; NAD HENT: conjugate gaze; oropharynx a little dry CV: regular rate and rhythm; no JVD Pulmonary: CTA B/L; no W/R/R- good air movement GI: soft, NT, ND, (+)BS- protuberant Psychiatric: appropriate- flat affect Neurological: Ox3   Skin; dry skin on arms/legs- cannot assess buttocks today Prior exams: MS: less TTP in R knee- effusion slightly better; B/ hand shaking- due to weakness sin hands/Ue's.  Neuro: Alert and oriented x 3. Normal insight and awareness. Intact Memory. Normal language and speech. Cranial nerve exam unremarkable. Stocking glove sensory loss BLE L>>R. BUE both hands/wrists. UE strength 4/5 except distal is 4-/5 LE's- HF 2+/5; KE 4-/5; DF 3+ to 4-/5 and PF 4- to 4/5 B/L  Skin: multiple IV's and blood draw tape areas- MS: R knee- still has mild effusion- TTP diffusely.    Assessment/Plan: 1. Functional  deficits which require 3+ hours per day of interdisciplinary therapy in a comprehensive inpatient rehab setting. Physiatrist is providing close team supervision and 24 hour management of active medical problems listed below. Physiatrist and rehab team continue to assess barriers to discharge/monitor patient progress toward functional and medical goals  Care Tool:  Bathing    Body parts bathed by patient: Right arm, Left arm, Chest, Abdomen, Front perineal area, Right upper leg, Left upper leg, Face   Body parts bathed by helper: Buttocks, Right lower leg, Left lower leg     Bathing assist Assist Level: Moderate Assistance - Patient 50 - 74%     Upper Body Dressing/Undressing Upper body dressing   What is the patient wearing?: Pull over shirt    Upper body assist Assist Level: Set up assist    Lower Body Dressing/Undressing Lower body dressing      What is the patient wearing?: Underwear/pull up, Pants     Lower body assist Assist for lower body dressing: Moderate Assistance - Patient 50 - 74%     Toileting Toileting  Toileting assist Assist for toileting: 2 Helpers     Transfers Chair/bed transfer  Transfers assist     Chair/bed transfer assist level: Contact Guard/Touching assist     Locomotion Ambulation   Ambulation assist   Ambulation activity did not occur: Safety/medical concerns  Assist level: 2 helpers Assistive device: Lite Gait Max distance: 32   Walk 10 feet activity   Assist  Walk 10 feet activity did not occur: Safety/medical concerns    Assistive device: Lite Gait   Walk 50 feet activity   Assist Walk 50 feet with 2 turns activity did not occur: Safety/medical concerns         Walk 150 feet activity   Assist Walk 150 feet activity did not occur: Safety/medical concerns         Walk 10 feet on uneven surface  activity   Assist Walk 10 feet on uneven surfaces activity did not occur: Safety/medical concerns          Wheelchair     Assist Is the patient using a wheelchair?: Yes Type of Wheelchair: Power    Wheelchair assist level: Supervision/Verbal cueing Max wheelchair distance: 150    Wheelchair 50 feet with 2 turns activity    Assist        Assist Level: Supervision/Verbal cueing   Wheelchair 150 feet activity     Assist      Assist Level: Supervision/Verbal cueing   Blood pressure 115/66, pulse 69, temperature 97.8 F (36.6 C), temperature source Oral, resp. rate 18, height 5' 4"$  (1.626 m), weight 129 kg, last menstrual period 08/17/2012, SpO2 99 %.  Medical Problem List and Plan: 1. 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 More training with chair so she can get out of tight spaces  Con't CIR PT and OT- getting closer on SNF possible.  2.  Antithrombotics: -DVT/anticoagulation:  Pharmaceutical: Lovenox start 1/17             -antiplatelet therapy: none   3. 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 Changed  flexeril to 10 mg TID- not prn  2/13- Back pain gradually improving- did an exercise with therapy that hurt back- was stopped.   2/14- con't regimen- going well 4. Mood/Behavior/Sleep: LCSW to evaluate and provide emotional support             -  antipsychotic agents: n/a   1/24- anxiety is limiting therapy- will add Buspar and Celexa for anxiety  1/28 a little frustrated by ongoing deficits  1/30- thinks need SNF due to deficits- frustrated by this  2/1- will increase Buspar  2/7- increased Celexa to 40 mg daily 5. Neuropsych/cognition: This patient is capable of making decisions on her own behalf.   6. Skin/Wound Care: Routine skin care checks   7. Fluids/Electrolytes/Nutrition: Routine Is and Os and follow-up chemistries             -protein supplements for hypoalbuminemia             -continue folic acid, [id number]             -continue MVI   1/18- will check with pharmacy if needs B12 shots AND daily pills.   8: Chronic back pain/radiculopathy:  -continue Dilaudid 4 mg q 6 hours - is her home medicine (PTA per Dr. [name])             -continue gabapentin 600 mg TID             -continue Lidoderm patch daily             -continue Flexeril 10 mg TID prn   1/26- will d/c Lidoderm- not using.  Otherwise, pain controlled- con't regimen 9: Urinary retention: Foley replaced on 1/15             -treated for UTI with Rocephin for  7 days   1/18- will start Flomax 0.4 mg qsupper and will try and remove Foley in AM- explained will need in/out caths likely before bladder muscle kicks in and she can void.   1/19- foley out this Am at 6am- hasn't voided yet- added bladder scans q6 hours and then cath of volumes /350cc- pt made aware can take a few days to kick in/the flomax. Has voided some and cathed x1-   1/20- no more caths require-d voiding on her own- con't flomax  1/22- d/c bladder scans since voiding well- only required 1 cath since foley removed fyi.   1/23- voiding well- d/c bladder  scans  2/6- resolved? But Cr up to 2.29- and BUN 26-   2/7- off Lasix  2/8- Cr 1.21 and Bun 27- sotpped IVFs  2/12- labs pending  2/13- Cr 1.12 and BUN 16-   2/14- not drinking well- will con't to push water intake.  10: Hypertension: monitor TID and prn             -continue Lasix 40 mg daily             -continue Lopressor 50 mg BID             -add magnesium gluconate 2103m HS   1/22- BP controlled- con't regimen  1/29- Lasix 40 mg BID- but home dose 40 mg daily- since Cr 1.26 up from 0.75 last week, will reduce to daily. And recheck Thursday  2/1- new Cr 1.10- and BUN better at 17- from 22- will recheck Monday- sinc eon lower dose of Lasix- no increase in LE edema  2/6- Cr up to 2.29- will recheck today and in AM- says she's drinking- will recheck today and if still elevated, we need IVFs 75cc/hour. If is still elevated, will also stop Lasix for now.   2/7- down to 1.82- will do IVF's another 24 hours and recheck in AM  2/8- will stop IVFs- off Lasix- no LE edema- needs weight-  will order  2/10 elevated, possibly pain related, continue to monitor for now  2/11 BP improved, may be related to pain improving, continue to monitor  2/12- BP controlled- con't regimen    10/30/2022    4:04 AM 10/29/2022    7:53 PM 10/29/2022    1:32 PM  Vitals with BMI  Systolic [id number]  Diastolic 66 81 78  Pulse 69 95 66     11: GERD?/GI prophylaxis: continue Protonix   12: B12 deficiency: continue supplementation   1/19- says itches with B12 shots, but just takes benadryl 13: Class 3 obesity: BMI = 48; weight loss counseling   1/24- BMI down to 46.62  2/1- BMI 47.15  2/7- pt eating a lot of junk food per staff- 1 lb M&M's eaten in 2 days; having a lot of fast food brought by husband 14: Hyponatremia, mild: follow-up BMP   2/1- Na 137- doing better  2/12- labs pending 15: Elevated serum creatinine/AKI; 0.75 1/16 to 1.06 on 1/17             -follow-up BMP   1/18- stable- con't to monitor  weekly and prn  1/29- Cr 1.16- from 0.75- will reduce Lasix  2/1- labs today- Cr 1.10- and no LE edema on exam  2/2- not drinking- per pt- hates water- encouraged Mio/to add flavor to water and to drink 6-8 cups/day.   2/7- Cr 1.82 down from 2.29 and BUN 37- off Lasix as of this AM- will give another 24 hours of IVFs at 75cc/hour and recheck in AM  2/8- stopped IVFs since Cr down to 1.20 and BUN down to 27- wil recheck in AM to make sure stays down. 2/9- Cr 0.99 and BUN 18- no IVFs in last 24 hours- so happy with results- encouraged pt to keep drinking water. Recheck Monday  2/13- Cr 1.12- is inching back up- BUN stable- will recheck Thursday since is back up some- since 1.12, no intervention right this moment- encourage pt to drink still.  16: GBS: continue to monitor NIFs/VIC q 12 hours             -continue gabapentin   1/19- will increase gabapaentin to 900 mg TID  1/23- slightly better- con't regimen  2/1- tingling/burning is gone on Gabapentin  2/4: NIFs -40   2/7- NIF's stable at -40  2/8- will stop Vital capacity and NIF since been good for 1 week- will restart if Sx's get worse.  17: Glaucoma: continue latanoprost eye drops   18: Tobacco use: cessation counseling   19. Constipation  1/18- will con't Senna per pt request- to not increase- but if no BM by tomorrow, will try Sorbitol.   2/2 regular BM's per pt 2/3 - large BM  2/6- LBM 2 days ago- if no result by tomorrow, will give Sorbitol  2/9- finally got cleaned out last night after Mylanta-  20.. insomnia  1/20- will start Melatonin 5 mg QHS prn- don't want trazodone since can cause urinary retention  1/22- sleeping a little better 21. Anxiety  1/24- added Buspar 5 mg TID- will also add Celexa for mood/anxiety prevention 20 mg daily. 1/25- no side effects so far.    1/26-27- feels anxiety is do ing MUCH better per pt. 2/1- anxiety still limiting therapy per PT/OT- will increase Buspar to 10 mg TID 2/7- pt says anxiety  still limiting- so does therapy- will increase Celexa to 40 mg daily.  22. R knee buckling/pain  2/1- got  brace- wasn't real effective/"is thin"- I educated pt another type knee brace would be too heavy for her to lift with her weakness- will call ortho to see if any ideas since hurting MORE overnight- R knee- -ever since fall over weekend on R knee directly.   2/2- Xray just shows severe knee DJD- need for Knee replacement- no fractures- will add Diclofenac 50 mg TID x 5 days to help pain. Since her renal function slightly impaired (not drinking water), will not do longer than 5 days.   2/9- pain has been doing bette rin R knee 23. Hyperkalemia  2/7- K+ down to 5.1- if still elevated tomorrow, then will use Lokelma  2/9- K+ 4.9-   2/9- K+ 5.0- pt doesn't want Loklema since just "got cleaned out".   Recheck tomorrow  2/13- K+ 4.6 24. Abd pain  2/9- given mylanta- but had Bms "all night"- so likely was full of stool causing abd pain- eating more than meals from hospital- a lot of snacks from outside.   2/10 improving, had multiple bowel movements this morning  2/11 Additional BM today, improved 25. Dry skin  2/12- added eucerin for dry skin BID  2/14- working better   LOS: 28 days A FACE TO FACE EVALUATION WAS PERFORMED  Dvaughn Fickle 10/30/2022, 8:01 AM

## 2022-10-30 NOTE — Progress Notes (Signed)
Physical Therapy Session Note  Patient Details  Name: Dana [name] MRN: [id number] Date of Birth: 1966/07/22  Today's Date: 10/30/2022 PT Individual Time: 0945-1100, 1445-1530  PT Individual Time Calculation (min): 75 min , 45 min   Short Term Goals: Week 4:  PT Short Term Goal 1 (Week 4): Pt will consisntely require CGA with STS with LRAD PT Short Term Goal 2 (Week 4): Pt will require min A for stand pivot transfer with LRAD PT Short Term Goal 3 (Week 4): Pt will require (S) for dynamic standing with LRAD  Skilled Therapeutic Interventions/Progress Updates:      Therapy Documentation Precautions:  Precautions Precautions: Fall Precaution Comments: weakness, numbness L anterior thigh. Restrictions Weight Bearing Restrictions: No  Treatment Session 1:   Pt agreeable to PT session and with unrated low back pain. PT provided soft tissue movilization to paraspinals x 8 minutes for pain relief.   Pt (S) with [place] navigation and able to set up w/c for stand pivot transfer. PT provided total A to change shoes as pt reports her normal sneakers are twisting.   Pt min A with stand pivot to mat table. Pt performed modified reverse sit ups with 2.2 # weighted ball and pt reported increase in low back pain. PT provided STM to address and pt also states pain radiates into L LE.   Pt performed L LE nerve glides 2 x 10 with visual cues for technique. Pt min A with stand pivot to return to w/c and left seated at bedside in [place] with all needs in reach.   Treatment Session 2:   Pt agreeable to PT session and without verbal reports of pain in session.   Pt (S) with PWC mobility to dayroom and agreeable to dynamic standing activities.   Pt requires CGA for sit to stand and min A for dynamic standing balance while reaching for bean bag and aiming for target. Pt performed 3 bouts of activity and able to tolerate standing ~2 minutes each bout.   Pt transitioned to sitting and performed dynamic  reaching and aiming of bean bag for target with L UE as pt presents with increased coordination and proprioceptive deficits.   Pt navigated PWC to room and pt requesting spouse educated on STEDY transfers. PT demonstrated and spouse performed transfer with patient from [facility] to recliner safely.   Pt left in recliner at bedside with all needs in reach. Safety plan updated.   Therapy/Group: Individual Therapy  Verl Dicker Verl Dicker PT, DPT  10/30/2022, 7:41 AM

## 2022-10-30 NOTE — Progress Notes (Signed)
Occupational Therapy Session Note  Patient Details  Name: Dana [name] MRN: [id number] Date of Birth: 04/07/1966  Today's Date: 10/30/2022 OT Individual Time: [id number] OT Individual Time Calculation (min): 73 min   Today's Date: 10/30/2022 OT Individual Time: [id number] OT Individual Time Calculation (min): 26 min   Short Term Goals: Week 4:  OT Short Term Goal 1 (Week 4): STGs=LTGs due to patient's length of stay.  Skilled Therapeutic Interventions/Progress Updates:   Session 1: Pt received sitting EOB with nursing staff presesnt for skilled OT session with focus on BADL retraining . Pt agreeable to interventions, demonstrating overall pleasant mood. Pt with un-rated lower back, stating "It's this leg that giving me trouble (L leg)." OT offering intermediate rest breaks and positioning suggestions throughout session to address pain/fatigue and maximize participation/safety in session.   Pt performs all functional transfers with use of stedy + supervision for energy conservation. With use of stedy, pt transported in/out of bathroom to complete shower. Pt declining need for toileting, transferring onto TTB with Min A due to low surface. Pt requiring overall Min A to complete full-body shower for thorough cleaning of bilat feet, using long-handled sponge for BLEs and peri-cleaning with lateral leans.  Sitting EOB, pt threads underwear/sweat-pants with use of reacher, donning bra with Min A for fastening and t-shirt with item-retrieval. Pt dons socks/shoes with Max-total A. Using stedy, pt dependent for donning LB garments over bottom/hips due to time constraints.   Pt requires increased for application of lotion and anti-itch ointment for improved skin integrity.   Pt remained sitting in [facility] with all immediate needs met at end of session. Pt continues to be appropriate for skilled OT intervention to promote further functional independence.   Session 2: Pt received sitting in [facility] for  skilled OT session with focus on functional reach and gentle stretching. Pt agreeable to interventions, demonstrating overall pleasanr mood. Pt with fluctuating pain, stating "the message from earlier helped" in reference to earlier PT session. OT offering intermediate rest breaks and positioning suggestions throughout session to address pain/fatigue and maximize participation/safety in session.   Pt drives/maneuvers PWC with supervision and verbal cuing for object avoidance and adjusting speed for increased safety in smaller areas. In ortho gym, pt attempts sci-fit modality for ~2-3 mins but is unable to tolerate longer participation due to low back pain.   Session transitioned to functional reach activity with use of squigz and mirror, focusing removal of squigz from low areas to simulate reach needed for footwear management. Pt completes two rounds of activity, demonstrating improved gross grip.  Pt then performs 5-6 reps with 5 sec holds of "cat cows" and gentle torso twists for low-back stretching.   Pt remained sitting in [place] with sister present and all immediate needs met at end of session. Pt continues to be appropriate for skilled OT intervention to promote further functional independence.   Therapy Documentation Precautions:  Precautions Precautions: Fall Precaution Comments: weakness, numbness L anterior thigh. Restrictions Weight Bearing Restrictions: No   Therapy/Group: Individual Therapy  Maudie Mercury, OTR/L, MSOT  10/30/2022, 7:56 AM

## 2022-10-31 LAB — BASIC METABOLIC PANEL
Anion gap: 11 (ref 5–15)
BUN: 20 mg/dL (ref 6–20)
CO2: 22 mmol/L (ref 22–32)
Calcium: 8.8 mg/dL — ABNORMAL LOW (ref 8.9–10.3)
Chloride: 101 mmol/L (ref 98–111)
Creatinine, Ser: 1.02 mg/dL — ABNORMAL HIGH (ref 0.44–1.00)
GFR, Estimated: >60 mL/min (ref >60)
Glucose, Bld: 109 mg/dL — ABNORMAL HIGH (ref 70–99)
Potassium: 4.3 mmol/L (ref 3.5–5.1)
Sodium: 134 mmol/L — ABNORMAL LOW (ref 135–145)

## 2022-10-31 MED ORDER — DICLOFENAC SODIUM 25 MG PO TBEC
50.0000 mg | DELAYED_RELEASE_TABLET | Freq: Two times a day (BID) | ORAL | Status: DC
  Administered 2022-10-31 – 2022-11-04 (×9): 50 mg via ORAL
  Filled 2022-10-31 (×9): qty 2

## 2022-10-31 MED ORDER — LIDOCAINE HCL (PF) 1 % IJ SOLN
10.0000 mL | Freq: Once | INTRAMUSCULAR | Status: AC
  Administered 2022-10-31: 10 mL via INTRADERMAL
  Filled 2022-10-31: qty 30

## 2022-10-31 NOTE — Progress Notes (Signed)
Physical Therapy Session Note  Patient Details  Name: Dana [name] MRN: [id number] Date of Birth: 19-Apr-1966  Today's Date: 10/31/2022 PT Individual Time: [id number] PT Individual Time Calculation (min): 45 min   Short Term Goals: Week 3:  PT Short Term Goal 1 (Week 3): Pt will require CGA for STS PT Short Term Goal 1 - Progress (Week 3): Met PT Short Term Goal 2 (Week 3): Pt will require CGA for stand pivot transfer PT Short Term Goal 2 - Progress (Week 3): Not met PT Short Term Goal 3 (Week 3): Pt will require CGA with LRAD x 30 ft PT Short Term Goal 3 - Progress (Week 3): Not met  Skilled Therapeutic Interventions/Progress Updates:    pt received in bed and agreeable to therapy. Pt reports pain managed on medication at this time. Session focused on dressing EOB for activity tolerance and endurance. Pt required assist for reaching B feet as therapist did not have reacher. Pt performed ambulatory transfer with RW and CGA to [place]. Pt navigated PWC around unit with supervision before returning to room. Pt remained in [place] at end of session with all needs in reach.   Therapy Documentation Precautions:  Precautions Precautions: Fall Precaution Comments: weakness, numbness L anterior thigh. Restrictions Weight Bearing Restrictions: No General:       Therapy/Group: Individual Therapy  Mickel Fuchs 10/31/2022, 12:20 PM

## 2022-10-31 NOTE — Progress Notes (Signed)
Occupational Therapy Session Note  Patient Details  Name: Dana [name] MRN: [id number] Date of Birth: 1966/09/14  Today's Date: 10/31/2022 OT Individual Time: 0902-1000 OT Individual Time Calculation (min): 58 min    Short Term Goals: Week 3:  OT Short Term Goal 2 (Week 3): STGs=LTGs due to patient's length of stay. OT Short Term Goal 2 - Progress (Week 3): Progressing toward goal  Skilled Therapeutic Interventions/Progress Updates:   Pt seen for skilled OT session. Pt upset at start of session stating "I am so confused- am I getting a STEDY or not?" OT took a moment to obtain clarification and OT and PT called husband and provided him info re: STEDY online ordering as well and family educ scheduled for tomorrow. Went back to pt and reported info and pt felt much more releif. Pt able to [facility] in and out of room bathroom with close S. SPT with HDRW taking ~ 6 steps to commode over toilet and back with CGA. Pt able to pull pants down and up and reach peri region. Once back in w/c, pt able to [place] hallways to [place] unit on moderate then gauging to slower when negotiating tighter spaces with CGA overall with 85 % accuracy. OT used 2 resistance clips pt had from balloons for new in room program to increase [facility] and intrinsic strength trng. Left pt in [place] with all safety needs and nurse call button in reach.   Therapy Documentation Precautions:  Precautions Precautions: Fall Precaution Comments: weakness, numbness L anterior thigh. Restrictions Weight Bearing Restrictions: No   Therapy/Group: Individual Therapy  Barnabas Lister 10/31/2022, 7:48 AM

## 2022-10-31 NOTE — Progress Notes (Signed)
Physical Therapy Session Note  Patient Details  Name: Dana [name] MRN: [id number] Date of Birth: [birth year]  Today's Date: 10/31/2022 PT Individual Time: 1007-1048, 1315-1500 PT Individual Time Calculation (min): 41 min   Short Term Goals: Week 4:  PT Short Term Goal 1 (Week 4): Pt will consisntely require CGA with STS with LRAD PT Short Term Goal 2 (Week 4): Pt will require min A for stand pivot transfer with LRAD PT Short Term Goal 3 (Week 4): Pt will require (S) for dynamic standing with LRAD  Skilled Therapeutic Interventions/Progress Updates:      Therapy Documentation Precautions:  Precautions Precautions: Fall Precaution Comments: weakness, numbness L anterior thigh. Restrictions Weight Bearing Restrictions: No  Treatment Session 1:   Pt received seated in PWC at bedside and agreeable to PT session with a focus on discharge planning. PT educated pt on rationale for DME recommendation. PT recommends pt discharge home with HHPT services with heavy duty rolling walker. STEDY sit to stand lift, and hospital bed. PT also educated pt's spouse while pt present regarding PT recommendation. Pt and spouse report they are willing to purchase a STEDY sit to stand. PT informed pt and her spouse to notify team and social worker when [facility] would be delivered (Monday or Wednesday) of upcomming week. Pt spouse agreeable to participate in family education on Friday 2/16 from 1-3 and is unable to provide physical assistance due to lifting restrictions. At this time pt requires CGA/min A with sit to stand and stand pivot transfers, but presents with increased anxiety and fear of falling as well. PT recommends pt and spouse perform STEDY transfer for safety as pt with proprioceptive deficits and often unable to locate w/c or foot placement in space with mobility and pt utilizes secondary assist for safety in therapy sessions. PT also provided pt with estimate of hospital bed measurements as pt is  concerned where it will fit in her house. Pt currently unable to perform bed mobility without mod A and would benefit from hospital bed. Pt left seated in PWC at bedside and plan to continue discharge preparation.    Treatment Session 2:   Pt received seated in PWC at bedside and report's her spouse is on the way to the hospital and would be willing to particpate in family education. Pt family member unable to join, but recreational therapist present in session and provided support regarding stress management and discharge planning. Pt performed 3 x 10 of bicep curls with 3# weight bilaterally to address UE strength deficits. Pt navigated PWC with supervision to dayroom and pt describe home set-up. PT recommended pt utilize indoor slow speed while in the home and pt agreeable. Pt presents with difficulty with push off from [place] arm rest and PT applied co band tape and pt reports improved competency. Pt CGA with sit to stand and min A for dynamic standing balance as pt performed left and right UE ipsilateral and contralateral reaching of hand to target. Pt requested to walk and ambulated 22 ft + 40 ft +35 ft with HDRW and CGA/min A with close chair follow (pt's max gait distance). Pt navigated PWC in open hallways on outdoor speed and requires mod A for management as pt with fine motor deficits of R hand. Pt left seated in PWC at bedside with all needs in reach.   Therapy/Group: Individual Therapy  Verl Dicker Verl Dicker PT, DPT  10/31/2022, 7:50 AM

## 2022-10-31 NOTE — Progress Notes (Signed)
PROGRESS NOTE   Subjective/Complaints:  Pt reports L low back really hurting this AM-  Keeping heat on her back, but hurts so bad "cannot do therapy".   Crazy hurting per pt.  Got meds at 6:30 am  Michela Pitcher is drinking daily- more water.     ROS:   Pt denies SOB, abd pain, CP, N/V/C/D, and vision changes    Except for HPI Objective:   No results found. No results for input(s): "WBC", "HGB", "HCT", "PLT" in the last 72 hours.     Recent Labs    10/31/22 0637  NA 134*  K 4.3  CL 101  CO2 22  GLUCOSE 109*  BUN 20  CREATININE 1.02*  CALCIUM 8.8*     Intake/Output Summary (Last 24 hours) at 10/31/2022 1038 Last data filed at 10/31/2022 0600 Gross per 24 hour  Intake 472 ml  Output --  Net 472 ml        Physical Exam: Vital Signs Blood pressure 126/71, pulse 87, temperature 98.2 F (36.8 C), temperature source Oral, resp. rate 18, height 5' 4"$  (1.626 m), weight 129 kg, last menstrual period 08/17/2012, SpO2 100 %.          General: awake, alert, appropriate, sitting up in bed; harder to scoot in bed; NAD HENT: conjugate gaze; oropharynx moist CV: regular rate; no JVD Pulmonary: CTA B/L; no W/R/R- good air movement GI: soft, NT, ND, (+)BS Psychiatric: appropriate but near tears due to pain; anxious Neurological: Ox3 MS: extremely TTP L low back- lumbar paraspinals Skin; dry skin on arms/legs- cannot assess buttocks today Prior exams: MS: less TTP in R knee- effusion slightly better; B/ hand shaking- due to weakness sin hands/Ue's.  Neuro: Alert and oriented x 3. Normal insight and awareness. Intact Memory. Normal language and speech. Cranial nerve exam unremarkable. Stocking glove sensory loss BLE L>>R. BUE both hands/wrists. UE strength 4/5 except distal is 4-/5 LE's- HF 2+/5; KE 4-/5; DF 3+ to 4-/5 and PF 4- to 4/5 B/L  Skin: multiple IV's and blood draw tape areas- MS: R knee- still has  mild effusion- TTP diffusely.    Assessment/Plan: 1. Functional deficits which require 3+ hours per day of interdisciplinary therapy in a comprehensive inpatient rehab setting. Physiatrist is providing close team supervision and 24 hour management of active medical problems listed below. Physiatrist and rehab team continue to assess barriers to discharge/monitor patient progress toward functional and medical goals  Care Tool:  Bathing    Body parts bathed by patient: Right arm, Left arm, Chest, Abdomen, Front perineal area, Buttocks, Right upper leg, Left upper leg, Face   Body parts bathed by helper: Left lower leg, Right lower leg     Bathing assist Assist Level: Minimal Assistance - Patient > 75%     Upper Body Dressing/Undressing Upper body dressing   What is the patient wearing?: Pull over shirt    Upper body assist Assist Level: Independent with assistive device    Lower Body Dressing/Undressing Lower body dressing      What is the patient wearing?: Underwear/pull up, Pants     Lower body assist Assist for lower body dressing: Moderate Assistance - Patient  31 - 74%     Toileting Toileting    Toileting assist Assist for toileting: 2 Helpers     Transfers Chair/bed transfer  Transfers assist     Chair/bed transfer assist level: Contact Guard/Touching assist     Locomotion Ambulation   Ambulation assist   Ambulation activity did not occur: Safety/medical concerns  Assist level: 2 helpers Assistive device: Lite Gait Max distance: 32   Walk 10 feet activity   Assist  Walk 10 feet activity did not occur: Safety/medical concerns    Assistive device: Lite Gait   Walk 50 feet activity   Assist Walk 50 feet with 2 turns activity did not occur: Safety/medical concerns         Walk 150 feet activity   Assist Walk 150 feet activity did not occur: Safety/medical concerns         Walk 10 feet on uneven surface  activity   Assist  Walk 10 feet on uneven surfaces activity did not occur: Safety/medical concerns         Wheelchair     Assist Is the patient using a wheelchair?: Yes Type of Wheelchair: Power    Wheelchair assist level: Supervision/Verbal cueing Max wheelchair distance: 150    Wheelchair 50 feet with 2 turns activity    Assist        Assist Level: Supervision/Verbal cueing   Wheelchair 150 feet activity     Assist      Assist Level: Supervision/Verbal cueing   Blood pressure 126/71, pulse 87, temperature 98.2 F (36.8 C), temperature source Oral, resp. rate 18, height 5' 4"$  (1.626 m), weight 129 kg, last menstrual period 08/17/2012, SpO2 100 %.  Medical Problem List and Plan: 1. 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 More training with chair so she can get out of tight spaces  D/c 2/19 planned husband coming for family training tomorrow  Con't CIR PT and OT 2.  Antithrombotics: -DVT/anticoagulation:  Pharmaceutical: Lovenox start 1/17 2/15- will switch to Eliquis on d/c to 2.5 mg BID for 1 more month.              -antiplatelet therapy: none   3. 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 Changed flexeril to 10 mg TID- not prn  2/13- Back pain gradually improving-  did an exercise with therapy that hurt back- was stopped.   2/15- pain worse- will add Diclofenac 50 mg BID for now as well as TrP injections today 4. Mood/Behavior/Sleep: LCSW to evaluate and provide emotional support             -antipsychotic agents: n/a   1/24- anxiety is limiting therapy- will add Buspar and Celexa for anxiety  1/28 a little frustrated by ongoing deficits  1/30- thinks need SNF due to deficits- frustrated by this  2/1- will increase Buspar  2/7- increased Celexa to 40 mg daily 5. Neuropsych/cognition: This patient is capable of making decisions on her own behalf.   6. Skin/Wound Care: Routine skin care checks   7. Fluids/Electrolytes/Nutrition: Routine Is and Os and follow-up chemistries             -protein supplements for hypoalbuminemia             -continue folic acid, [id number]             -continue MVI   1/18- will check with pharmacy if needs B12 shots AND daily pills.   8: Chronic back pain/radiculopathy:  -continue Dilaudid 4 mg q 6 hours - is her home medicine (PTA per Dr. [name])             -continue gabapentin 600 mg TID             -continue Lidoderm patch daily             -continue Flexeril 10 mg TID prn   1/26- will d/c Lidoderm- not using.  Otherwise, pain controlled- con't regimen 9: Urinary retention: Foley replaced on 1/15             -treated for UTI with Rocephin for  7 days   1/18- will start Flomax 0.4 mg qsupper and will try and remove Foley in AM- explained will need in/out caths likely before bladder muscle kicks in and she can void.   1/19- foley out this Am at 6am- hasn't voided yet- added bladder scans q6 hours and then cath of volumes /350cc- pt made aware can take a few days to kick  in/the flomax. Has voided some and cathed x1-   1/20- no more caths require-d voiding on her own- con't flomax  1/22- d/c bladder scans since voiding well- only required 1 cath since foley removed fyi.   1/23- voiding well- d/c bladder scans  2/6- resolved? But Cr up to 2.29- and BUN 26-   2/7- off Lasix  2/8- Cr 1.21 and Bun 27- sotpped IVFs  2/12- labs pending  2/13- Cr 1.12 and BUN 16-   2/14- not drinking well- will con't to push water intake.   2/15- Cr down to 1.02 and BUN 20 today- is doing better- push fluids daily.  10: Hypertension: monitor TID and prn             -continue Lasix 40 mg daily             -continue Lopressor 50 mg BID             -add magnesium gluconate 230m HS   1/22- BP controlled- con't regimen  1/29- Lasix 40 mg BID- but home dose 40 mg daily- since Cr 1.26 up from 0.75 last week, will reduce to daily. And recheck Thursday  2/1- new Cr 1.10- and BUN better at 17- from 22- will recheck Monday- sinc eon lower dose of Lasix- no increase  in LE edema  2/6- Cr up to 2.29- will recheck today and in AM- says she's drinking- will recheck today and if still elevated, we need IVFs 75cc/hour. If is still elevated, will also stop Lasix for now.   2/7- down to 1.82- will do IVF's another 24 hours and recheck in AM  2/8- will stop IVFs- off Lasix- no LE edema- needs weight- will order  2/10 elevated, possibly pain related, continue to monitor for now  2/11 BP improved, may be related to pain improving, continue to monitor  2/12- BP controlled- con't regimen    10/31/2022    5:38 AM 10/30/2022    7:47 PM 10/30/2022    3:35 PM  Vitals with BMI  Systolic [id number]  Diastolic 71 68 82  Pulse 87 89 77     11: GERD?/GI prophylaxis: continue Protonix   12: B12 deficiency: continue supplementation   1/19- says itches with B12 shots, but just takes benadryl 13: Class 3 obesity: BMI = 48; weight loss counseling   1/24- BMI down to 46.62  2/1- BMI 47.15  2/7- pt eating a  lot of junk food per staff- 1 lb M&M's eaten in 2 days; having a lot of fast food brought by husband 14: Hyponatremia, mild: follow-up BMP   2/1- Na 137- doing better  2/12- labs pending 15: Elevated serum creatinine/AKI; 0.75 1/16 to 1.06 on 1/17             -follow-up BMP   1/18- stable- con't to monitor weekly and prn  1/29- Cr 1.16- from 0.75- will reduce Lasix  2/1- labs today- Cr 1.10- and no LE edema on exam  2/2- not drinking- per pt- hates water- encouraged Mio/to add flavor to water and to drink 6-8 cups/day.   2/7- Cr 1.82 down from 2.29 and BUN 37- off Lasix as of this AM- will give another 24 hours of IVFs at 75cc/hour and recheck in AM  2/8- stopped IVFs since Cr down to 1.20 and BUN down to 27- wil recheck in AM to make sure stays down. 2/9- Cr 0.99 and BUN 18- no IVFs in last 24 hours- so happy with results- encouraged pt to keep drinking water. Recheck Monday  2/13- Cr 1.12- is inching back up- BUN stable- will recheck Thursday since is back up some- since 1.12, no intervention right this moment- encourage pt to drink still.  2/15- Cr 1.02- doing better 16: GBS: continue to monitor NIFs/VIC q 12 hours             -continue gabapentin   1/19- will increase gabapaentin to 900 mg TID  1/23- slightly better- con't regimen  2/1- tingling/burning is gone on Gabapentin  2/4: NIFs -40   2/7- NIF's stable at -40  2/8- will stop Vital capacity and NIF since been good for 1 week- will restart if Sx's get worse.  17: Glaucoma: continue latanoprost eye drops   18: Tobacco use: cessation counseling   19. Constipation  1/18- will con't Senna per pt request- to not increase- but if no BM by tomorrow, will try Sorbitol.   2/2 regular BM's per pt 2/3 - large BM  2/6- LBM 2 days ago- if no result by tomorrow, will give Sorbitol  2/9- finally got cleaned out last night after Mylanta-  20.. insomnia  1/20- will start Melatonin 5 mg QHS prn- don't want trazodone since can cause urinary  retention  1/22- sleeping a little better 21. Anxiety  1/24-  added Buspar 5 mg TID- will also add Celexa for mood/anxiety prevention 20 mg daily. 1/25- no side effects so far.    1/26-27- feels anxiety is do ing MUCH better per pt. 2/1- anxiety still limiting therapy per PT/OT- will increase Buspar to 10 mg TID 2/7- pt says anxiety still limiting- so does therapy- will increase Celexa to 40 mg daily.  22. R knee buckling/pain  2/1- got brace- wasn't real effective/"is thin"- I educated pt another type knee brace would be too heavy for her to lift with her weakness- will call ortho to see if any ideas since hurting MORE overnight- R knee- -ever since fall over weekend on R knee directly.   2/2- Xray just shows severe knee DJD- need for Knee replacement- no fractures- will add Diclofenac 50 mg TID x 5 days to help pain. Since her renal function slightly impaired (not drinking water), will not do longer than 5 days.   2/9- pain has been doing bette rin R knee 23. Hyperkalemia  2/7- K+ down to 5.1- if still elevated tomorrow, then will use Lokelma  2/9- K+ 4.9-   2/9- K+ 5.0- pt doesn't want Loklema since just "got cleaned out".   Recheck tomorrow  2/13- K+ 4.6 24. Abd pain  2/9- given mylanta- but had Bms "all night"- so likely was full of stool causing abd pain- eating more than meals from hospital- a lot of snacks from outside.   2/10 improving, had multiple bowel movements this morning  2/11 Additional BM today, improved 25. Dry skin  2/12- added eucerin for dry skin BID  2/14- working better  Patient here for trigger point injections for secondary myofascial pain  Consent done and on chart.  Cleaned areas with alcohol and injected using a 27 gauge 1.5 inch needle  Injected 3cc Using 1% Lidocaine with no EPI  Upper traps Levators Posterior scalenes Middle scalenes Splenius Capitus Pectoralis Major Rhomboids Infraspinatus Teres Major/minor Thoracic paraspinals Lumbar  paraspinals- L low back x3- very TTP Other injections-     There was no bleeding or complications.  Patient was advised to drink a lot of water on day after injections to flush system Will have increased soreness for 12-48 hours after injections.  Can use Lidocaine patches the day AFTER injections Can use theracane on day of injections in places didn't inject Can use heating pad 4-6 hours AFTER injections   I spent a total of 51   minutes on total care today- >50% coordination of care- due to  Gathering materials and doing trigger point injections of L low back after second visit.    LOS: 29 days A FACE TO FACE EVALUATION WAS PERFORMED  Dana [name] 10/31/2022, 10:38 AM

## 2022-10-31 NOTE — Progress Notes (Signed)
Recreational Therapy Session Note  Patient Details  Name: Dana [name] MRN: [id number] Date of Birth: 01-18-1966 Today's Date: 10/31/2022  Pain: no c/o Skilled Therapeutic Interventions/Progress Updates: Session focused on discharge planning and stress management during co-treat with PT.  Pt sharing that discharge plan is to now return home with husband.  Pt shared frustration and challenges with extended family dynamics and concerns.  Discussion further focused on discharge planning including home accessibility and potential modifications.  Pt feels confident that she will be able to move around her home easily in power chair accessing most important areas.  Pt encouraged to use low indoor speed on power chair for safety, although pt prefers to move quicker.  Pt stated understanding. TSIMPSON,[name] 10/31/2022, 4:09 PM

## 2022-10-31 NOTE — Progress Notes (Addendum)
Patient ID: Dana [name], female   DOB: 06-23-1966, 57 y.o.   MRN: [id number] Met with pt and let know MD feels Monday 2/19 is the latest pt can stay and will need to go home since not accepting any of the bed offers. Reached out to husband to inform him of this and the equipment needs. He reports they have no room for a hospital bed and their bed is high at home. Have asked [facility]-PT to reach out to him to discuss, along with stedy information. Due to thinking about getting one of these.They have a bedside commode from mother in-law who has never used. Discussed ambulance home and will need rolling walker. [facility] to reach out and explain the assist they can provide since husband under the impression they can provide more care then just home health. Will need to try to set up more education since husband trained on stedy. Messaged MD husband would also like to speak with her.   2:09 PM Husband called this worker to express concerns regarding limited therapies at home and with OP. He wants to make sure she is getting all of the rehab she can and part of him feels she is not ready to go home. Discussed she wants to go home and their insurance may not cover for her to go to a SNF since she has been here for 30 days. He will come in tomorrow for training but does need specifications for hospital bed so can make room at home for this. Wants four facilities that offered her a bed so he can look at them again.

## 2022-11-01 NOTE — Progress Notes (Addendum)
Patient ID: Dana [name], female   DOB: 09-03-1966, 58 y.o.   MRN: [id number]  Spoke with [employer]-melissa who reports went to medical director and decision was last covered day is 2/20 with discharge date of 2/21. Will update both pt and husband when here at 1:00 for education  2:12 PM Met with pt and husband who is here for education and have decided plan is going home not to a NH. Husband will gather the children and get them to assist both of them at home. Will go ahead and order hospital bed, husband to see if has rolling walker at home from his past surgery. Encouraged him to come back to do more education and have children come in also between now and Wed. Pt is doing well today in therapies. Will go home via non-emergency ambulance. Work toward discharge. Husband reports received the stedy today-delivered

## 2022-11-01 NOTE — Progress Notes (Signed)
Physical Therapy Session Note  Patient Details  Name: DERRICA ALIOTO MRN: [id number] Date of Birth: 24-Dec-1965  Today's Date: 11/01/2022 PT Individual Time: [id number] PT Individual Time Calculation (min): 46 min   Short Term Goals: Week 3:  PT Short Term Goal 1 (Week 3): Pt will require CGA for STS PT Short Term Goal 1 - Progress (Week 3): Met PT Short Term Goal 2 (Week 3): Pt will require CGA for stand pivot transfer PT Short Term Goal 2 - Progress (Week 3): Not met PT Short Term Goal 3 (Week 3): Pt will require CGA with LRAD x 30 ft PT Short Term Goal 3 - Progress (Week 3): Not met Week 4:  PT Short Term Goal 1 (Week 4): Pt will consisntely require CGA with STS with LRAD PT Short Term Goal 2 (Week 4): Pt will require min A for stand pivot transfer with LRAD PT Short Term Goal 3 (Week 4): Pt will require (S) for dynamic standing with LRAD Week 5:     Skilled Therapeutic Interventions/Progress Updates:      Therapy Documentation Precautions:  Precautions Precautions: Fall Precaution Comments: weakness, numbness L anterior thigh. Restrictions Weight Bearing Restrictions: No General:   Vital Signs:   Pain:   Mobility:   Locomotion :    Trunk/Postural Assessment :    Balance:   Exercises:   Other Treatments:      Therapy/Group: Individual Therapy  Alger Simons PT, DPT, CSRS 11/01/2022, 6:51 PM

## 2022-11-01 NOTE — Progress Notes (Signed)
Occupational Therapy Session Note  Patient Details  Name: Dana [name] MRN: [id number] Date of Birth: Mar 07, 1966  Today's Date: 11/01/2022 OT Individual Time: 0900-0945 1st Session, 1400-1500 2nd Session  OT Individual Time Calculation (min): 45 min, 60 min    Short Term Goals: Week 3:  OT Short Term Goal 2 (Week 3): STGs=LTGs due to patient's length of stay. OT Short Term Goal 2 - Progress (Week 3): Progressing toward goal  Skilled Therapeutic Interventions/Progress Updates:  1st Session:  Pt seen for full am self care session for sponge bathing, dressing, and grooming EOB level. OT able to reassess current level and train in strategies to maximize independence for tentative discharge home with husband. Pt able to use hospital bed (pt will have hospital bed at home) features for peri and buttocks hygiene as well as LB garment mngt for improved and functional reach due to body habitus. UB dressing incl bra with front clasp with mod I, LB pull on garments with S using bed level rolling for reach and max indep strategy as pt's husband has back issues. Grooming with mod I seated. Left pt in the care of NT due to end of session.   2nd Session:   Pt seen for skilled OT session with focus on Family Education with husband. Clarification with MSW Jacqlyn Larsen and husband and pt that pt will be discharging to home on Wed 2/21 and Grad Day Tues 2/20. Care coord with Jackson Latino, PT as to what was completed in her Family Educ for OT to wrap up OT domain and other outstanding training items. Also, coordinated with [name] from [place] that [facility] would be picked up on Tues PM at [facility] and brought to home with Gwyndolyn Saxon as contact. Pt to be transported home via ambulance and no PWC needs Wed am here at [facility] as staff can use bed and STEDY for am care. All agreed with plan and primary OT and PT made aware via messages as off this day. No pain reported this session. Husband Gwyndolyn Saxon active and hands-on during full therapy  session. Pt up in [place] upon OT arrival and was able to navigate with S in and out of bathroom to access toilet. Pt able to use RW to amb to and from toilet in bathroom 5 ft x 2 with CGA with min cues for forward weight shifts and hand placement. Pt was able to demo pull off and on elastic pants and underpants with CGA, perform peri and buttocks hygiene in standing with CGA. Pt had completed TTB with PT earlier and both parties felt comfortable with that training. Once back in [place] pt able to pull up to hospital bed and demonstrated features and educ pt's husband to ensure  rails are delivered on their bed as well as if they realize bed is semi-electric so the up/down feature is a crank and would need extra co-pay for fully electric and both pt and husband aware. OT provided written UE HEP for tband, tputty, foam cubes, clips and proximal ROM B Ue's and pt and husband reported understanding with pt teaching back from written info. Left HEP in discharge bin for home use. Husband had no further questions and reported feeling educated sufficiently by OT in prep for home d/c. Left pt bed level with exit alarm set, safety measures, needs and husband at bedside.      Therapy Documentation Precautions:  Precautions Precautions: Fall Precaution Comments: weakness, numbness L anterior thigh. Restrictions Weight Bearing Restrictions: No   Therapy/Group: Individual  Therapy  Barnabas Lister 11/01/2022, 7:49 AM

## 2022-11-01 NOTE — Progress Notes (Signed)
Physical Therapy Session Note  Patient Details  Name: Dana [name] MRN: [id number] Date of Birth: May 26, 1966  Today's Date: 11/01/2022 PT Individual Time: 1302-1407 PT Individual Time Calculation (min): 65 min   Short Term Goals: Week 3:  PT Short Term Goal 1 (Week 3): Pt will require CGA for STS PT Short Term Goal 1 - Progress (Week 3): Met PT Short Term Goal 2 (Week 3): Pt will require CGA for stand pivot transfer PT Short Term Goal 2 - Progress (Week 3): Not met PT Short Term Goal 3 (Week 3): Pt will require CGA with LRAD x 30 ft PT Short Term Goal 3 - Progress (Week 3): Not met Week 4:  PT Short Term Goal 1 (Week 4): Pt will consisntely require CGA with STS with LRAD PT Short Term Goal 2 (Week 4): Pt will require min A for stand pivot transfer with LRAD PT Short Term Goal 3 (Week 4): Pt will require (S) for dynamic standing with LRAD Week 5:     Skilled Therapeutic Interventions/Progress Updates:      Therapy Documentation Precautions:  Precautions Precautions: Fall Precaution Comments: weakness, numbness L anterior thigh. Restrictions Weight Bearing Restrictions: No General:   Vital Signs:   Pain:   Mobility:   Locomotion :    Trunk/Postural Assessment :    Balance:   Exercises:   Other Treatments:      Therapy/Group: Individual Therapy  Alger Simons PT, DPT, CSRS 11/01/2022, 6:54 PM

## 2022-11-01 NOTE — Progress Notes (Signed)
PROGRESS NOTE   Subjective/Complaints:  Pt reports Trp injections were really helpful -feeling so much better  Walked a total of 90 ft with RW yesterday- so happy Is very grateful for her care.  ROS:  Pt denies SOB, abd pain, CP, N/V/C/D, and vision changes    Except for HPI Objective:   No results found. No results for input(s): "WBC", "HGB", "HCT", "PLT" in the last 72 hours.     Recent Labs    10/31/22 0637  NA 134*  K 4.3  CL 101  CO2 22  GLUCOSE 109*  BUN 20  CREATININE 1.02*  CALCIUM 8.8*     Intake/Output Summary (Last 24 hours) at 11/01/2022 1949 Last data filed at 11/01/2022 1700 Gross per 24 hour  Intake 945 ml  Output --  Net 945 ml        Physical Exam: Vital Signs Blood pressure (!) 146/61, pulse 76, temperature 98 F (36.7 C), temperature source Oral, resp. rate 18, height 5' 4"$  (1.626 m), weight 129 kg, last menstrual period 08/17/2012, SpO2 100 %.           General: awake, alert, appropriate, NAD HENT: conjugate gaze; oropharynx moist CV: regular rate; no JVD Pulmonary: CTA B/L; no W/R/R- good air movement GI: soft, NT, ND, (+)BS Psychiatric: appropriate Neurological: Ox3  MS: much less  TTP L low back- lumbar paraspinals Skin; dry skin on arms/legs- cannot assess buttocks today Prior exams: MS: less TTP in R knee- effusion slightly better; B/ hand shaking- due to weakness sin hands/Ue's.  Neuro: Alert and oriented x 3. Normal insight and awareness. Intact Memory. Normal language and speech. Cranial nerve exam unremarkable. Stocking glove sensory loss BLE L>>R. BUE both hands/wrists. UE strength 4/5 except distal is 4-/5 LE's- HF 2+/5; KE 4-/5; DF 3+ to 4-/5 and PF 4- to 4/5 B/L  Skin: multiple IV's and blood draw tape areas- MS: R knee- still has mild effusion- TTP diffusely.    Assessment/Plan: 1. Functional deficits which require 3+ hours per day of  interdisciplinary therapy in a comprehensive inpatient rehab setting. Physiatrist is providing close team supervision and 24 hour management of active medical problems listed below. Physiatrist and rehab team continue to assess barriers to discharge/monitor patient progress toward functional and medical goals  Care Tool:  Bathing    Body parts bathed by patient: Right arm, Left arm, Chest, Abdomen, Front perineal area, Buttocks, Right upper leg, Left upper leg, Face, Right lower leg, Left lower leg   Body parts bathed by helper: Left lower leg, Right lower leg     Bathing assist Assist Level: Minimal Assistance - Patient > 75%     Upper Body Dressing/Undressing Upper body dressing   What is the patient wearing?: Pull over shirt    Upper body assist Assist Level: Independent with assistive device    Lower Body Dressing/Undressing Lower body dressing      What is the patient wearing?: Underwear/pull up, Pants     Lower body assist Assist for lower body dressing: Minimal Assistance - Patient > 75%     Toileting Toileting    Toileting assist Assist for toileting: Contact Guard/Touching assist  Transfers Chair/bed transfer  Transfers assist     Chair/bed transfer assist level: Contact Guard/Touching assist     Locomotion Ambulation   Ambulation assist   Ambulation activity did not occur: Safety/medical concerns  Assist level: 2 helpers Assistive device: Lite Gait Max distance: 32   Walk 10 feet activity   Assist  Walk 10 feet activity did not occur: Safety/medical concerns    Assistive device: Lite Gait   Walk 50 feet activity   Assist Walk 50 feet with 2 turns activity did not occur: Safety/medical concerns         Walk 150 feet activity   Assist Walk 150 feet activity did not occur: Safety/medical concerns         Walk 10 feet on uneven surface  activity   Assist Walk 10 feet on uneven surfaces activity did not occur:  Safety/medical concerns         Wheelchair     Assist Is the patient using a wheelchair?: Yes Type of Wheelchair: Power    Wheelchair assist level: Supervision/Verbal cueing Max wheelchair distance: 150    Wheelchair 50 feet with 2 turns activity    Assist        Assist Level: Supervision/Verbal cueing   Wheelchair 150 feet activity     Assist      Assist Level: Supervision/Verbal cueing   Blood pressure (!) 146/61, pulse 76, temperature 98 F (36.7 C), temperature source Oral, resp. rate 18, height 5' 4"$  (1.626 m), weight 129 kg, last menstrual period 08/17/2012, SpO2 100 %.  Medical Problem List and Plan: 1. 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 More training with chair so she can get out of tight spaces  D/c 2/21  Con't CIR PT and OT 2.  Antithrombotics: -DVT/anticoagulation:  Pharmaceutical: Lovenox start 1/17 2/15- will switch to Eliquis on d/c to 2.5 mg BID for 1 more month.              -antiplatelet therapy: none   3. 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 Changed flexeril to 10 mg TID- not prn  2/13- Back pain gradually improving- did an exercise with therapy that hurt back- was stopped.   2/15- pain worse- will add Diclofenac 50 mg BID for now as well as TrP injections today  2/16- back doing much better 4. Mood/Behavior/Sleep: LCSW to evaluate and provide emotional support             -antipsychotic agents: n/a   1/24- anxiety is limiting therapy- will add Buspar and Celexa for anxiety  1/28 a little frustrated by ongoing deficits  1/30- thinks need SNF due to deficits- frustrated by this  2/1- will increase Buspar  2/7- increased Celexa to 40 mg daily 5. Neuropsych/cognition: This patient is capable of making decisions on her own behalf.   6. Skin/Wound Care: Routine skin care checks   7. Fluids/Electrolytes/Nutrition: Routine Is and Os and follow-up chemistries             -protein supplements for hypoalbuminemia             -continue folic acid, [id number]             -continue MVI   1/18- will check with pharmacy if needs B12 shots AND daily pills.   8: Chronic back pain/radiculopathy:  -continue Dilaudid 4 mg q 6 hours - is her home medicine (PTA per Dr. [name])             -continue gabapentin 600 mg TID             -continue Lidoderm patch daily             -continue Flexeril 10 mg TID prn   1/26- will d/c Lidoderm- not using.  Otherwise, pain controlled- con't regimen  2/16- TrP injections yesterday- back doing SO much better 9: Urinary retention: Foley replaced on 1/15             -treated for UTI with Rocephin for  7 days   1/18- will start Flomax 0.4 mg qsupper and will try and remove Foley in AM- explained will need in/out caths likely before bladder muscle kicks in and she can void.   1/19- foley out this Am at 6am- hasn't voided yet- added bladder scans q6 hours and then cath of volumes /350cc- pt made aware can take a few days to kick in/the  flomax. Has voided some and cathed x1-   1/20- no more caths require-d voiding on her own- con't flomax  1/22- d/c bladder scans since voiding well- only required 1 cath since foley removed fyi.   1/23- voiding well- d/c bladder scans  2/6- resolved? But Cr up to 2.29- and BUN 26-   2/7- off Lasix  2/8- Cr 1.21 and Bun 27- sotpped IVFs  2/12- labs pending  2/13- Cr 1.12 and BUN 16-   2/14- not drinking well- will con't to push water intake.   2/15- Cr down to 1.02 and BUN 20 today- is doing better- push fluids daily.  10: Hypertension: monitor TID and prn             -continue Lasix 40 mg daily             -continue Lopressor 50 mg BID             -add magnesium gluconate 216m HS   1/22- BP controlled- con't regimen  1/29- Lasix 40 mg BID- but home dose 40 mg daily- since Cr 1.26 up from 0.75 last week, will reduce to daily. And recheck Thursday  2/1- new Cr 1.10- and BUN better at 17- from 22- will recheck Monday- sinc eon lower dose of Lasix- no increase in LE edema  2/6- Cr up to 2.29- will recheck today and in  AM- says she's drinking- will recheck today and if still elevated, we need IVFs 75cc/hour. If is still elevated, will also stop Lasix for now.   2/7- down to 1.82- will do IVF's another 24 hours and recheck in AM  2/8- will stop IVFs- off Lasix- no LE edema- needs weight- will order  2/10 elevated, possibly pain related, continue to monitor for now  2/16- BP usually controlled- is 146/61- con't to monitor for trend    11/01/2022    7:41 PM 11/01/2022    1:00 PM 11/01/2022    3:00 AM  Vitals with BMI  Systolic [id number]  Diastolic 61 69 67  Pulse 76 67 78     11: GERD?/GI prophylaxis: continue Protonix   12: B12 deficiency: continue supplementation   1/19- says itches with B12 shots, but just takes benadryl 13: Class 3 obesity: BMI = 48; weight loss counseling   1/24- BMI down to 46.62  2/1- BMI 47.15  2/7- pt eating a lot of junk food per staff- 1 lb M&M's eaten in  2 days; having a lot of fast food brought by husband 14: Hyponatremia, mild: follow-up BMP   2/1- Na 137- doing better  2/12- labs pending 15: Elevated serum creatinine/AKI; 0.75 1/16 to 1.06 on 1/17             -follow-up BMP   1/18- stable- con't to monitor weekly and prn  1/29- Cr 1.16- from 0.75- will reduce Lasix  2/1- labs today- Cr 1.10- and no LE edema on exam  2/2- not drinking- per pt- hates water- encouraged Mio/to add flavor to water and to drink 6-8 cups/day.   2/7- Cr 1.82 down from 2.29 and BUN 37- off Lasix as of this AM- will give another 24 hours of IVFs at 75cc/hour and recheck in AM  2/8- stopped IVFs since Cr down to 1.20 and BUN down to 27- wil recheck in AM to make sure stays down. 2/9- Cr 0.99 and BUN 18- no IVFs in last 24 hours- so happy with results- encouraged pt to keep drinking water. Recheck Monday  2/13- Cr 1.12- is inching back up- BUN stable- will recheck Thursday since is back up some- since 1.12, no intervention right this moment- encourage pt to drink still.  2/15- Cr 1.02- doing better 16: GBS: continue to monitor NIFs/VIC q 12 hours             -continue gabapentin   1/19- will increase gabapaentin to 900 mg TID  1/23- slightly better- con't regimen  2/1- tingling/burning is gone on Gabapentin  2/4: NIFs -40   2/7- NIF's stable at -40  2/8- will stop Vital capacity and NIF since been good for 1 week- will restart if Sx's get worse.   2/16- no worsening so far 17: Glaucoma: continue latanoprost eye drops   18: Tobacco use: cessation counseling   19. Constipation  1/18- will con't Senna per pt request- to not increase- but if no BM by tomorrow, will try Sorbitol.   2/2 regular BM's per pt 2/3 - large BM  2/6- LBM 2 days ago- if no result by tomorrow, will give Sorbitol  2/9- finally got cleaned out last night after Mylanta-  20.. insomnia  1/20- will start Melatonin 5 mg QHS prn- don't want trazodone since can cause urinary retention  1/22-  sleeping a little better 21. Anxiety  1/24- added Buspar 5 mg TID- will also add Celexa for mood/anxiety prevention 20 mg daily.  1/25- no side effects so far.    1/26-27- feels anxiety is do ing MUCH better per pt. 2/1- anxiety still limiting therapy per PT/OT- will increase Buspar to 10 mg TID 2/7- pt says anxiety still limiting- so does therapy- will increase Celexa to 40 mg daily.  22. R knee buckling/pain  2/1- got brace- wasn't real effective/"is thin"- I educated pt another type knee brace would be too heavy for her to lift with her weakness- will call ortho to see if any ideas since hurting MORE overnight- R knee- -ever since fall over weekend on R knee directly.   2/2- Xray just shows severe knee DJD- need for Knee replacement- no fractures- will add Diclofenac 50 mg TID x 5 days to help pain. Since her renal function slightly impaired (not drinking water), will not do longer than 5 days.   2/9- pain has been doing bette rin R knee 23. Hyperkalemia  2/7- K+ down to 5.1- if still elevated tomorrow, then will use Lokelma  2/9- K+ 4.9-   2/9- K+ 5.0- pt doesn't want Loklema since just "got cleaned out".   Recheck tomorrow  2/13- K+ 4.6  2/16- K+ 4.3 24. Abd pain  2/9- given mylanta- but had Bms "all night"- so likely was full of stool causing abd pain- eating more than meals from hospital- a lot of snacks from outside.   2/10 improving, had multiple bowel movements this morning  2/11 Additional BM today, improved 25. Dry skin  2/12- added eucerin for dry skin BID  2/14- working better  Patient here for trigger point injections for secondary myofascial pain  Consent done and on chart.  Cleaned areas with alcohol and injected using a 27 gauge 1.5 inch needle  Injected 3cc Using 1% Lidocaine with no EPI  Upper traps Levators Posterior scalenes Middle scalenes Splenius Capitus Pectoralis Major Rhomboids Infraspinatus Teres Major/minor Thoracic paraspinals Lumbar  paraspinals- L low back x3- very TTP Other injections-     There was no bleeding or complications.  Patient was advised to drink a lot of water on day after injections to flush system Will have increased soreness for 12-48 hours after injections.  Can use Lidocaine patches the day AFTER injections Can use theracane on day of injections in places didn't inject Can use heating pad 4-6 hours AFTER injections    I spent a total of  35  minutes on total care today- >50% coordination of care- due to  D/w SW about d/c- extended to 2/21 per insurance.   LOS: 30 days A FACE TO FACE EVALUATION WAS PERFORMED  Jet Traynham 11/01/2022, 7:49 PM

## 2022-11-02 NOTE — Progress Notes (Signed)
PROGRESS NOTE   Subjective/Complaints: No new complaints this morning Patient's chart reviewed- No issues reported overnight Vitals signs stable except for hypotension   ROS:  Pt denies SOB, abd pain, CP, N/V/C/D, and vision changes    Except for HPI Objective:   No results found. No results for input(s): "WBC", "HGB", "HCT", "PLT" in the last 72 hours.     Recent Labs    10/31/22 0637  NA 134*  K 4.3  CL 101  CO2 22  GLUCOSE 109*  BUN 20  CREATININE 1.02*  CALCIUM 8.8*     Intake/Output Summary (Last 24 hours) at 11/02/2022 1701 Last data filed at 11/02/2022 1400 Gross per 24 hour  Intake 720 ml  Output --  Net 720 ml        Physical Exam: Vital Signs Blood pressure (!) 106/59, pulse 71, temperature 97.6 F (36.4 C), temperature source Oral, resp. rate 18, height 5' 4"$  (1.626 m), weight 129 kg, last menstrual period 08/17/2012, SpO2 99 %. Gen: no distress, normal appearing HEENT: oral mucosa pink and moist, NCAT Cardio: Reg rate Chest: normal effort, normal rate of breathing Abd: soft, non-distended Ext: no edema  Psychiatric: appropriate Neurological: Ox3  MS: much less  TTP L low back- lumbar paraspinals Skin; dry skin on arms/legs- cannot assess buttocks today Prior exams: MS: less TTP in R knee- effusion slightly better; B/ hand shaking- due to weakness sin hands/Ue's.  Neuro: Alert and oriented x 3. Normal insight and awareness. Intact Memory. Normal language and speech. Cranial nerve exam unremarkable. Stocking glove sensory loss BLE L>>R. BUE both hands/wrists. UE strength 4/5 except distal is 4-/5 LE's- HF 2+/5; KE 4-/5; DF 3+ to 4-/5 and PF 4- to 4/5 B/L  Skin: multiple IV's and blood draw tape areas- MS: R knee- still has mild effusion- TTP diffusely.    Assessment/Plan: 1. Functional deficits which require 3+ hours per day of interdisciplinary therapy in a comprehensive  inpatient rehab setting. Physiatrist is providing close team supervision and 24 hour management of active medical problems listed below. Physiatrist and rehab team continue to assess barriers to discharge/monitor patient progress toward functional and medical goals  Care Tool:  Bathing    Body parts bathed by patient: Right arm, Left arm, Chest, Abdomen, Front perineal area, Buttocks, Right upper leg, Left upper leg, Face, Right lower leg, Left lower leg   Body parts bathed by helper: Left lower leg, Right lower leg     Bathing assist Assist Level: Minimal Assistance - Patient > 75%     Upper Body Dressing/Undressing Upper body dressing   What is the patient wearing?: Pull over shirt    Upper body assist Assist Level: Independent with assistive device    Lower Body Dressing/Undressing Lower body dressing      What is the patient wearing?: Underwear/pull up, Pants     Lower body assist Assist for lower body dressing: Minimal Assistance - Patient > 75%     Toileting Toileting    Toileting assist Assist for toileting: Contact Guard/Touching assist     Transfers Chair/bed transfer  Transfers assist     Chair/bed transfer assist level: Contact Guard/Touching assist  Locomotion Ambulation   Ambulation assist   Ambulation activity did not occur: Safety/medical concerns  Assist level: 2 helpers Assistive device: Lite Gait Max distance: 32   Walk 10 feet activity   Assist  Walk 10 feet activity did not occur: Safety/medical concerns    Assistive device: Lite Gait   Walk 50 feet activity   Assist Walk 50 feet with 2 turns activity did not occur: Safety/medical concerns         Walk 150 feet activity   Assist Walk 150 feet activity did not occur: Safety/medical concerns         Walk 10 feet on uneven surface  activity   Assist Walk 10 feet on uneven surfaces activity did not occur: Safety/medical concerns          Wheelchair     Assist Is the patient using a wheelchair?: Yes Type of Wheelchair: Power    Wheelchair assist level: Supervision/Verbal cueing Max wheelchair distance: 150    Wheelchair 50 feet with 2 turns activity    Assist        Assist Level: Supervision/Verbal cueing   Wheelchair 150 feet activity     Assist      Assist Level: Supervision/Verbal cueing   Blood pressure (!) 106/59, pulse 71, temperature 97.6 F (36.4 C), temperature source Oral, resp. rate 18, height 5' 4"$  (1.626 m), weight 129 kg, last menstrual period 08/17/2012, SpO2 99 %.  Medical Problem List and Plan: 1. 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 More training with chair so she can get out of tight spaces  D/c 2/21  Con't CIR PT and OT 2.  Antithrombotics: -DVT/anticoagulation:  Pharmaceutical: Lovenox start 1/17 2/15- will switch to Eliquis on d/c to 2.5 mg BID for 1 more month.              -antiplatelet therapy: none   3. 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 Changed flexeril to 10 mg TID- not prn  2/13- Back pain gradually improving- did an exercise with therapy that hurt back- was stopped.   2/15- pain worse- will add Diclofenac 50 mg BID for now as well as TrP injections today  2/16- back doing much better 4. Mood/Behavior/Sleep: LCSW to evaluate and provide emotional support             -  antipsychotic agents: n/a   1/24- anxiety is limiting therapy- will add Buspar and Celexa for anxiety  1/28 a little frustrated by ongoing deficits  1/30- thinks need SNF due to deficits- frustrated by this  2/1- will increase Buspar  2/7- increased Celexa to 40 mg daily 5. Neuropsych/cognition: This patient is capable of making decisions on her own behalf.   6. Skin/Wound Care: Routine skin care checks   7. Fluids/Electrolytes/Nutrition: Routine Is and Os and follow-up chemistries             -protein supplements for hypoalbuminemia             -continue folic acid, [id number]             -continue MVI   1/18- will check with pharmacy if needs B12 shots AND daily pills.   8: Chronic back pain/radiculopathy:  -continue Dilaudid 4 mg q 6 hours - is her home medicine (PTA per Dr. [name])             -continue gabapentin 600 mg TID             -continue Lidoderm patch daily             -continue Flexeril 10 mg TID prn   1/26- will d/c Lidoderm- not using.  Otherwise, pain controlled- con't regimen  2/16- TrP injections yesterday- back doing SO much better 9: Urinary retention: Foley replaced on 1/15             -treated for UTI with Rocephin for  7 days   1/18- will start Flomax 0.4 mg qsupper and will try and remove Foley in AM- explained will need in/out caths likely before bladder muscle kicks in and she can void.   1/19- foley out this Am at 6am- hasn't voided yet- added bladder scans q6 hours and then cath of volumes /350cc- pt made aware can take a few days to kick in/the flomax. Has voided some and cathed x1-    1/20- no more caths require-d voiding on her own- con't flomax  1/22- d/c bladder scans since voiding well- only required 1 cath since foley removed fyi.   1/23- voiding well- d/c bladder scans  2/6- resolved? But Cr up to 2.29- and BUN 26-   2/7- off Lasix  2/8- Cr 1.21 and Bun 27- sotpped IVFs  2/12- labs pending  2/13- Cr 1.12 and BUN 16-   2/14- not drinking well- will con't to push water intake.   2/15- Cr down to 1.02 and BUN 20 today- is doing better- push fluids daily.  10: Hypertension: monitor TID and prn             -continue Lasix 40 mg daily             -continue Lopressor 50 mg BID             -add magnesium gluconate 255m HS   1/22- BP controlled- con't regimen  1/29- Lasix 40 mg BID- but home dose 40 mg daily- since Cr 1.26 up from 0.75 last week, will reduce to daily. And recheck Thursday  2/1- new Cr 1.10- and BUN better at 17- from 22- will recheck Monday- sinc eon lower dose of Lasix- no increase in LE edema  2/6- Cr up to 2.29- will recheck today and in AM- says she's drinking- will recheck today and if still elevated, we need IVFs 75cc/hour. If is still elevated, will also stop Lasix for now.  2/7- down to 1.82- will do IVF's another 24 hours and recheck in AM  2/8- will stop IVFs- off Lasix- no LE edema- needs weight- will order  2/10 elevated, possibly pain related, continue to monitor for now  2/16- BP usually controlled- is 146/61- con't to monitor for trend    11/02/2022    2:10 PM 11/02/2022    5:20 AM 11/01/2022    7:41 PM  Vitals with BMI  Systolic [id number]   [id number]  Diastolic 59 64 61   61  Pulse 71 72 76     11: GERD?/GI prophylaxis: continue Protonix   12: B12 deficiency: continue supplementation   1/19- says itches with B12 shots, but just takes benadryl 13: Class 3 obesity: BMI = 48; weight loss counseling   1/24- BMI down to 46.62  2/1- BMI 47.15  2/7- pt eating a lot of junk food per staff- 1 lb M&M's eaten in 2 days; having a lot of  fast food brought by husband 14: Hyponatremia, mild: follow-up BMP   2/1- Na 137- doing better  2/12- labs pending 15: Elevated serum creatinine/AKI; 0.75 1/16 to 1.06 on 1/17             -follow-up BMP   1/18- stable- con't to monitor weekly and prn  1/29- Cr 1.16- from 0.75- will reduce Lasix  2/1- labs today- Cr 1.10- and no LE edema on exam  2/2- not drinking- per pt- hates water- encouraged Mio/to add flavor to water and to drink 6-8 cups/day.   2/7- Cr 1.82 down from 2.29 and BUN 37- off Lasix as of this AM- will give another 24 hours of IVFs at 75cc/hour and recheck in AM  2/8- stopped IVFs since Cr down to 1.20 and BUN down to 27- wil recheck in AM to make sure stays down. 2/9- Cr 0.99 and BUN 18- no IVFs in last 24 hours- so happy with results- encouraged pt to keep drinking water. Recheck Monday  2/13- Cr 1.12- is inching back up- BUN stable- will recheck Thursday since is back up some- since 1.12, no intervention right this moment- encourage pt to drink still.  2/15- Cr 1.02- doing better 16: GBS: continue to monitor NIFs/VIC q 12 hours             -continue gabapentin   1/19- will increase gabapaentin to 900 mg TID  1/23- slightly better- con't regimen  2/1- tingling/burning is gone on Gabapentin  2/4: NIFs -40   2/7- NIF's stable at -40  2/8- will stop Vital capacity and NIF since been good for 1 week- will restart if Sx's get worse.   2/16- no worsening so far 17: Glaucoma: continue latanoprost eye drops   18: Tobacco use: cessation counseling   19. Constipation  1/18- will con't Senna per pt request- to not increase- but if no BM by tomorrow, will try Sorbitol.   2/2 regular BM's per pt 2/3 - large BM  2/6- LBM 2 days ago- if no result by tomorrow, will give Sorbitol  2/9- finally got cleaned out last night after Mylanta-  20.. insomnia  1/20- will start Melatonin 5 mg QHS prn- don't want trazodone since can cause urinary retention  1/22- sleeping a little  better 21. Anxiety  1/24- added Buspar 5 mg TID- will also add Celexa for mood/anxiety prevention 20 mg daily. 1/25- no side effects so far.    1/26-27- feels anxiety is do ing MUCH better per pt. 2/1- anxiety  still limiting therapy per PT/OT- will increase Buspar to 10 mg TID 2/7- pt says anxiety still limiting- so does therapy- will increase Celexa to 40 mg daily.  22. R knee buckling/pain  2/1- got brace- wasn't real effective/"is thin"- I educated pt another type knee brace would be too heavy for her to lift with her weakness- will call ortho to see if any ideas since hurting MORE overnight- R knee- -ever since fall over weekend on R knee directly.   2/2- Xray just shows severe knee DJD- need for Knee replacement- no fractures- will add Diclofenac 50 mg TID x 5 days to help pain. Since her renal function slightly impaired (not drinking water), will not do longer than 5 days.   2/9- pain has been doing bette rin R knee 23. Hyperkalemia  2/7- K+ down to 5.1- if still elevated tomorrow, then will use Lokelma  2/9- K+ 4.9-   2/9- K+ 5.0- pt doesn't want Loklema since just "got cleaned out".   Recheck tomorrow  2/13- K+ 4.6  2/16- K+ 4.3 24. Abd pain  2/9- given mylanta- but had Bms "all night"- so likely was full of stool causing abd pain- eating more than meals from hospital- a lot of snacks from outside.   2/10 improving, had multiple bowel movements this morning  2/11 Additional BM today, improved 25. Dry skin  2/12- added eucerin for dry skin BID  2/14- working better  Patient here for trigger point injections for secondary myofascial pain  Consent done and on chart.  Cleaned areas with alcohol and injected using a 27 gauge 1.5 inch needle  Injected 3cc Using 1% Lidocaine with no EPI  Upper traps Levators Posterior scalenes Middle scalenes Splenius Capitus Pectoralis Major Rhomboids Infraspinatus Teres Major/minor Thoracic paraspinals Lumbar paraspinals- L low back x3-  very TTP Other injections-     There was no bleeding or complications.  Patient was advised to drink a lot of water on day after injections to flush system Will have increased soreness for 12-48 hours after injections.  Can use Lidocaine patches the day AFTER injections Can use theracane on day of injections in places didn't inject Can use heating pad 4-6 hours AFTER injections    I spent a total of  35  minutes on total care today- >50% coordination of care- due to  D/w SW about d/c- extended to 2/21 per insurance.   LOS: 31 days A FACE TO FACE EVALUATION WAS PERFORMED  Clide Deutscher Krizia Flight 11/02/2022, 5:01 PM

## 2022-11-03 NOTE — Progress Notes (Signed)
Occupational Therapy Session Note  Patient Details  Name: Dana [name] MRN: [id number] Date of Birth: 19-Jun-1966  Today's Date: 11/03/2022 OT Individual Time: [id number] OT Individual Time Calculation (min): 43 min    Short Term Goals: Week 4:  OT Short Term Goal 1 (Week 4): STGs=LTGs due to patient's length of stay.  Skilled Therapeutic Interventions/Progress Updates:  Pt received sitting in PWC for skilled OT session with focus on BADL retraining. Pt agreeable to interventions, demonstrating overall pleasant mood. Pt with un-rated pain, stating "Do you know I'm going home Wednesday?" OT offering intermediate rest breaks and positioning suggestions throughout session to address pain/fatigue and maximize participation/safety in session.   Pt with urgency, using stedy for PWC<>BSC transfers. Pt manages LB garments with supervision, requiring A for posterior peri-cleaning due to increased dysmetria.  Pt completes anterior peri-cleaning with set-up A sitting EOB with RLE raised on bed than transitioning to supine for increased access.   Pt requires increased A to thread LB garments for time management. Doffing/donning UB garments with Min A for untangling around bonnet, pt applies deodorant/perfume with item retrieval and increased time due to hand functioning.  Pt uses stedy to don LB garments with Max A due to time constraints. Pt transfers back into PWC, positioning self with supervision.   Pt remained seated in [place] with all immediate needs met at end of session. Pt continues to be appropriate for skilled OT intervention to promote further functional independence.   Therapy Documentation Precautions:  Precautions Precautions: Fall Precaution Comments: weakness, numbness L anterior thigh. Restrictions Weight Bearing Restrictions: No  Therapy/Group: Individual Therapy  Maudie Mercury, OTR/L, MSOT  11/03/2022, 6:04 AM

## 2022-11-03 NOTE — Progress Notes (Signed)
PROGRESS NOTE   Subjective/Complaints: No new complaints this morning Patient's chart reviewed- No issues reported overnight Vitals signs stable    ROS:  Pt denies SOB, abd pain, CP, N/V/C/D, and vision changes    Except for HPI Objective:   No results found. No results for input(s): "WBC", "HGB", "HCT", "PLT" in the last 72 hours.     No results for input(s): "NA", "K", "CL", "CO2", "GLUCOSE", "BUN", "CREATININE", "CALCIUM" in the last 72 hours.    Intake/Output Summary (Last 24 hours) at 11/03/2022 1047 Last data filed at 11/03/2022 0800 Gross per 24 hour  Intake 1200 ml  Output --  Net 1200 ml        Physical Exam: Vital Signs Blood pressure 136/71, pulse 73, temperature 98.5 F (36.9 C), resp. rate 18, height 5' 4"$  (1.626 m), weight 129 kg, last menstrual period 08/17/2012, SpO2 100 %. Gen: no distress, normal appearing HEENT: oral mucosa pink and moist, NCAT Cardio: Reg rate Chest: normal effort, normal rate of breathing Abd: soft, non-distended Ext: no edema  Psychiatric: appropriate Neurological: Ox3  MS: much less  TTP L low back- lumbar paraspinals Skin; dry skin on arms/legs- cannot assess buttocks today Prior exams: MS: less TTP in R knee- effusion slightly better; B/ hand shaking- due to weakness sin hands/Ue's.  Neuro: Alert and oriented x 3. Normal insight and awareness. Intact Memory. Normal language and speech. Cranial nerve exam unremarkable. Stocking glove sensory loss BLE L>>R. BUE both hands/wrists. UE strength 4/5 except distal is 4-/5 LE's- HF 2+/5; KE 4-/5; DF 3+ to 4-/5 and PF 4- to 4/5 B/L  Skin: multiple IV's and blood draw tape areas- MS: R knee- still has mild effusion- TTP diffusely.    Assessment/Plan: 1. Functional deficits which require 3+ hours per day of interdisciplinary therapy in a comprehensive inpatient rehab setting. Physiatrist is providing close team  supervision and 24 hour management of active medical problems listed below. Physiatrist and rehab team continue to assess barriers to discharge/monitor patient progress toward functional and medical goals  Care Tool:  Bathing    Body parts bathed by patient: Right arm, Left arm, Chest, Abdomen, Front perineal area, Buttocks, Right upper leg, Left upper leg, Face, Right lower leg, Left lower leg   Body parts bathed by helper: Left lower leg, Right lower leg     Bathing assist Assist Level: Minimal Assistance - Patient > 75%     Upper Body Dressing/Undressing Upper body dressing   What is the patient wearing?: Pull over shirt    Upper body assist Assist Level: Independent with assistive device    Lower Body Dressing/Undressing Lower body dressing      What is the patient wearing?: Underwear/pull up, Pants     Lower body assist Assist for lower body dressing: Minimal Assistance - Patient > 75%     Toileting Toileting    Toileting assist Assist for toileting: Contact Guard/Touching assist     Transfers Chair/bed transfer  Transfers assist     Chair/bed transfer assist level: Contact Guard/Touching assist     Locomotion Ambulation   Ambulation assist   Ambulation activity did not occur: Safety/medical concerns  Assist level:  2 helpers Assistive device: Lite Gait Max distance: 32   Walk 10 feet activity   Assist  Walk 10 feet activity did not occur: Safety/medical concerns    Assistive device: Lite Gait   Walk 50 feet activity   Assist Walk 50 feet with 2 turns activity did not occur: Safety/medical concerns         Walk 150 feet activity   Assist Walk 150 feet activity did not occur: Safety/medical concerns         Walk 10 feet on uneven surface  activity   Assist Walk 10 feet on uneven surfaces activity did not occur: Safety/medical concerns         Wheelchair     Assist Is the patient using a wheelchair?: Yes Type of  Wheelchair: Power    Wheelchair assist level: Supervision/Verbal cueing Max wheelchair distance: 150    Wheelchair 50 feet with 2 turns activity    Assist        Assist Level: Supervision/Verbal cueing   Wheelchair 150 feet activity     Assist      Assist Level: Supervision/Verbal cueing   Blood pressure 136/71, pulse 73, temperature 98.5 F (36.9 C), resp. rate 18, height 5' 4"$  (1.626 m), weight 129 kg, last menstrual period 08/17/2012, SpO2 100 %.  Medical Problem List and Plan: 1. 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 More training with chair so she can get out of tight spaces  D/c 2/21  Con't CIR PT and OT 2.  Antithrombotics: -DVT/anticoagulation:  Pharmaceutical: Lovenox start 1/17 2/15- will switch to Eliquis on d/c to 2.5 mg BID for 1 more month.              -antiplatelet therapy: none   3. 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 Changed flexeril to 10 mg TID- not prn  2/13- Back pain gradually  improving- did an exercise with therapy that hurt back- was stopped.   2/15- pain worse- will add Diclofenac 50 mg BID for now as well as TrP injections today  2/16- back doing much better 4. Anixety: continue celexa             -antipsychotic agents: n/a   1/24- anxiety is limiting therapy- will add Buspar and Celexa for anxiety  1/28 a little  frustrated by ongoing deficits  1/30- thinks need SNF due to deficits- frustrated by this  2/1- will increase Buspar  2/7- increased Celexa to 40 mg daily 5. Neuropsych/cognition: This patient is capable of making decisions on her own behalf.   6. Skin/Wound Care: Routine skin care checks   7. Fluids/Electrolytes/Nutrition: Routine Is and Os and follow-up chemistries             -protein supplements for hypoalbuminemia             -continue folic acid, [id number]             -continue MVI   1/18- will check with pharmacy if needs B12 shots AND daily pills.   8: Chronic back pain/radiculopathy:  -continue Dilaudid 4 mg q 6 hours - is her home medicine (PTA per Dr. [name])             -continue gabapentin 600 mg TID             -continue Lidoderm patch daily             -continue Flexeril 10 mg TID prn   1/26- will d/c Lidoderm- not using.  Otherwise, pain controlled- con't regimen  2/16- TrP injections yesterday- back doing SO much better 9: Urinary retention: Foley replaced on 1/15             -treated for UTI with Rocephin for  7 days   1/18- will start Flomax 0.4 mg qsupper and will try and remove Foley in AM- explained will need in/out caths likely before bladder muscle kicks in and she can void.   1/19- foley out this Am at 6am- hasn't voided yet- added bladder scans q6 hours and then cath of volumes /350cc- pt made aware can take a few days to kick in/the flomax. Has voided some and cathed x1-   1/20- no more caths require-d voiding on her own- con't flomax  1/22- d/c bladder scans since voiding well- only required 1 cath since foley  removed fyi.   1/23- voiding well- d/c bladder scans  2/6- resolved? But Cr up to 2.29- and BUN 26-   2/7- off Lasix  2/8- Cr 1.21 and Bun 27- sotpped IVFs  2/12- labs pending  2/13- Cr 1.12 and BUN 16-   2/14- not drinking well- will con't to push water intake.   2/15- Cr down to 1.02 and BUN 20 today- is doing better- push fluids daily.  10: Hypertension: monitor TID and prn             -continue Lasix 40 mg daily             -continue Lopressor 50 mg BID             -add magnesium gluconate 243m HS   1/22- BP controlled- con't regimen  1/29- Lasix 40 mg BID- but home dose 40 mg daily- since Cr 1.26 up from 0.75 last week, will reduce to daily. And recheck Thursday  2/1- new Cr 1.10- and BUN better at 17- from 22- will recheck Monday- sinc eon lower dose of Lasix- no increase in LE edema  2/6- Cr up to 2.29- will recheck today and in AM- says she's drinking- will recheck today and if still elevated, we need IVFs 75cc/hour. If is still elevated, will also stop Lasix for now.   2/7- down to 1.82- will do IVF's another 24 hours and recheck in AM  2/8- will stop IVFs- off  Lasix- no LE edema- needs weight- will order  2/10 elevated, possibly pain related, continue to monitor for now  2/16- BP usually controlled- is 146/61- con't to monitor for trend    11/03/2022    7:03 AM 11/02/2022    8:02 PM 11/02/2022    2:10 PM  Vitals with BMI  Systolic [id number]  Diastolic 71 60 59  Pulse 73 77 71     11: GERD?/GI prophylaxis: continue Protonix   12: B12 deficiency: continue supplementation   1/19- says itches with B12 shots, but just takes benadryl 13: Class 3 obesity: BMI = 48; weight loss counseling   1/24- BMI down to 46.62  2/1- BMI 47.15  2/7- pt eating a lot of junk food per staff- 1 lb M&M's eaten in 2 days; having a lot of fast food brought by husband 14: Hyponatremia, mild: follow-up BMP   2/1- Na 137- doing better  2/12- labs pending 15: Elevated serum creatinine/AKI; 0.75  1/16 to 1.06 on 1/17             -follow-up BMP   1/18- stable- con't to monitor weekly and prn  1/29- Cr 1.16- from 0.75- will reduce Lasix  2/1- labs today- Cr 1.10- and no LE edema on exam  2/2- not drinking- per pt- hates water- encouraged Mio/to add flavor to water and to drink 6-8 cups/day.   2/7- Cr 1.82 down from 2.29 and BUN 37- off Lasix as of this AM- will give another 24 hours of IVFs at 75cc/hour and recheck in AM  2/8- stopped IVFs since Cr down to 1.20 and BUN down to 27- wil recheck in AM to make sure stays down. 2/9- Cr 0.99 and BUN 18- no IVFs in last 24 hours- so happy with results- encouraged pt to keep drinking water. Recheck Monday  2/13- Cr 1.12- is inching back up- BUN stable- will recheck Thursday since is back up some- since 1.12, no intervention right this moment- encourage pt to drink still.  2/15- Cr 1.02- doing better 16: GBS: continue to monitor NIFs/VIC q 12 hours             -continue gabapentin   1/19- will increase gabapaentin to 900 mg TID  1/23- slightly better- con't regimen  2/1- tingling/burning is gone on Gabapentin  2/4: NIFs -40   2/7- NIF's stable at -40  2/8- will stop Vital capacity and NIF since been good for 1 week- will restart if Sx's get worse.   2/16- no worsening so far 17: Glaucoma: continue latanoprost eye drops   18: Tobacco use: cessation counseling   19. Constipation  1/18- will con't Senna per pt request- to not increase- but if no BM by tomorrow, will try Sorbitol.   2/2 regular BM's per pt 2/3 - large BM  2/6- LBM 2 days ago- if no result by tomorrow, will give Sorbitol  2/9- finally got cleaned out last night after Mylanta-  20.. insomnia  1/20- will start Melatonin 5 mg QHS prn- don't want trazodone since can cause urinary retention  1/22- sleeping a little better 21. Anxiety  1/24- added Buspar 5 mg TID- will also add Celexa for mood/anxiety prevention 20 mg daily. 1/25- no side effects so far.    1/26-27- feels  anxiety is do ing MUCH better per pt. 2/1- anxiety still limiting therapy per PT/OT- will increase Buspar to 10 mg TID 2/7- pt says anxiety still limiting- so does therapy- will increase Celexa to 40  mg daily.  22. R knee buckling/pain  2/1- got brace- wasn't real effective/"is thin"- I educated pt another type knee brace would be too heavy for her to lift with her weakness- will call ortho to see if any ideas since hurting MORE overnight- R knee- -ever since fall over weekend on R knee directly.   2/2- Xray just shows severe knee DJD- need for Knee replacement- no fractures- will add Diclofenac 50 mg TID x 5 days to help pain. Since her renal function slightly impaired (not drinking water), will not do longer than 5 days.   2/9- pain has been doing bette rin R knee 23. Hyperkalemia  2/7- K+ down to 5.1- if still elevated tomorrow, then will use Lokelma  2/9- K+ 4.9-   2/9- K+ 5.0- pt doesn't want Loklema since just "got cleaned out".   Recheck tomorrow  2/13- K+ 4.6  2/16- K+ 4.3 24. Abd pain  2/9- given mylanta- but had Bms "all night"- so likely was full of stool causing abd pain- eating more than meals from hospital- a lot of snacks from outside.   2/10 improving, had multiple bowel movements this morning  2/11 Additional BM today, improved 25. Dry skin  2/12- added eucerin for dry skin BID  2/14- working better  LOS: 32 days A FACE TO [place] 11/03/2022, 10:47 AM

## 2022-11-04 LAB — BASIC METABOLIC PANEL
Anion gap: 10 (ref 5–15)
BUN: 31 mg/dL — ABNORMAL HIGH (ref 6–20)
CO2: 25 mmol/L (ref 22–32)
Calcium: 8.8 mg/dL — ABNORMAL LOW (ref 8.9–10.3)
Chloride: 102 mmol/L (ref 98–111)
Creatinine, Ser: 1.64 mg/dL — ABNORMAL HIGH (ref 0.44–1.00)
GFR, Estimated: 36 mL/min — ABNORMAL LOW (ref >60)
Glucose, Bld: 93 mg/dL (ref 70–99)
Potassium: 6.1 mmol/L — ABNORMAL HIGH (ref 3.5–5.1)
Sodium: 137 mmol/L (ref 135–145)

## 2022-11-04 LAB — CBC
HCT: 31.1 % — ABNORMAL LOW (ref 36.0–46.0)
Hemoglobin: 9.9 g/dL — ABNORMAL LOW (ref 12.0–15.0)
MCH: 29.1 pg (ref 26.0–34.0)
MCHC: 31.8 g/dL (ref 30.0–36.0)
MCV: 91.5 fL (ref 80.0–100.0)
Platelets: 308 10*3/uL (ref 150–400)
RBC: 3.4 MIL/uL — ABNORMAL LOW (ref 3.87–5.11)
RDW: 13.5 % (ref 11.5–15.5)
WBC: 7.6 10*3/uL (ref 4.0–10.5)
nRBC: 0 % (ref 0.0–0.2)

## 2022-11-04 MED ORDER — SODIUM ZIRCONIUM CYCLOSILICATE 10 G PO PACK
10.0000 g | PACK | Freq: Every day | ORAL | Status: AC
  Administered 2022-11-04: 10 g via ORAL
  Filled 2022-11-04: qty 1

## 2022-11-04 MED ORDER — SODIUM CHLORIDE 0.9 % IV BOLUS
1000.0000 mL | Freq: Once | INTRAVENOUS | Status: AC
  Administered 2022-11-04: 1000 mL via INTRAVENOUS

## 2022-11-04 NOTE — Progress Notes (Signed)
Physical Therapy Session Note  Patient Details  Name: Dana [name] MRN: [id number] Date of Birth: [birth year]  Today's Date: 11/04/2022 PT Individual Time: 0800-0900, [id number]  PT Individual Time Calculation (min): 60 min   Short Term Goals: Week 4:  PT Short Term Goal 1 (Week 4): Pt will consisntely require CGA with STS with LRAD PT Short Term Goal 2 (Week 4): Pt will require min A for stand pivot transfer with LRAD PT Short Term Goal 3 (Week 4): Pt will require (S) for dynamic standing with LRAD  Skilled Therapeutic Interventions/Progress Updates:      Therapy Documentation Precautions:  Precautions Precautions: Fall Precaution Comments: weakness, numbness L anterior thigh. Restrictions Weight Bearing Restrictions: No  Treatment Session 1:   Pt agreeable to PT session with emphasis on toileting and transfer training. Pt without verbal reports of pain in session.   Pt received seated EOB requesting to toilet with urgency. Pt supervision with sit to stand with STEDY and transferred to toilet and pt requires supervision for peri-care and min A for lower body dressing.   Pt transferred to different [place] as [facility] planning to pick up current loaner PWC today. Pt performed face and hand washing with distant supervision.   Pt navigated PWC to dayroom and engaged in sit to stand transfers x 2 with HDRW and CGA for safety. Pt able to tolerated standing ~1 min.   Pt navigated PWC to room and left seated at bedside with all needs in reach.    Treatment Session 2:   Pt received semi-reclined in bed, agreeable to PT session with emphasis on gait and LE strength training.   Pt (S) with supine to sit and with sit to stand with STEDY from bed level. Pt transferred to [facility] and self-navigated with supervision to dayroom.   Pt ambulated 10 ft + 40 ft with HDRW with close [place] follow with min A. Pt presents with posterior step and loss of balance requiring min A for recovery,  otherwise pt largely CGA.   Pt performed right LE hip flexion 2 x 8 with 2# ankle weight and self-navigated PWC to room and supervision with STEDY transfer.   Pt requires supervision for standing in STEDY without perch seat while performing B UE contralateral reaching and tolerated standing ~1 minute.   Pt required min A for sit to lying and left semi-reclined in bed with all needs in reach and alarm on.   Therapy/Group: Individual Therapy  Verl Dicker Verl Dicker PT, DPT  11/04/2022, 7:34 AM

## 2022-11-04 NOTE — Progress Notes (Signed)
Occupational Therapy Session Note  Patient Details  Name: Dana [name] MRN: [id number] Date of Birth: 06/09/1966  Today's Date: 11/04/2022 OT Individual Time: 0950-1101 OT Individual Time Calculation (min): 71 min    Short Term Goals: Week 4:  OT Short Term Goal 1 (Week 4): STGs=LTGs due to patient's length of stay.  Skilled Therapeutic Interventions/Progress Updates:  Pt received sitting in PWC for skilled OT session with focus on community mobility, functional transfers/DME recommendations, and UE strengthening. Pt agreeable to interventions, demonstrating overall sadded mood. Pt with un-rated pain. OT offering intermediate rest breaks and positioning suggestions throughout session to address pain/fatigue and maximize participation/safety in session.   Pt self-drives/maneuvers PWC from room<>6M02 room to visit grandchild. Pt requiring increased verbal cuing to manage PWC features, primarily speed in changing environment and around obstacles. Pt challenged to manage elevator and door buttons in transitions around community, completing without issue.   Back on rehab unit, pt and OT discuss home bathroom layout and step-by step education provided for tub/shower transfer in home-environment with use of stedy; TTB and toileting aids reviewed with handout provided. Bariatric sock-aid handout also provided.   In day room, pt then participates in 3x10 sec overhead holds with 2lb dowel bar for general conditioning. Pt with decreased ability to perform abducted shoulder external rotation, stating "I had a surgery in this shoulder (R)," but unable to perform with BUEs, able to reach ~20 degrees of PROM.     Pt remained resting in [name] with all immediate needs met at end of session. Pt continues to be appropriate for skilled OT intervention to promote further functional independence.   Therapy Documentation Precautions:  Precautions Precautions: Fall Precaution Comments: weakness, numbness L  anterior thigh. Restrictions Weight Bearing Restrictions: No  Therapy/Group: Individual Therapy  Maudie Mercury, OTR/L, MSOT  11/04/2022, 7:55 AM

## 2022-11-04 NOTE — Progress Notes (Signed)
Arrived to room. Pt up in chair. Notified nurse to re consult when pt back to bed.

## 2022-11-04 NOTE — Progress Notes (Signed)
PROGRESS NOTE   Subjective/Complaints:  Reviewed labs elevated K+ , BUN and Creat   ROS:  Pt denies SOB, abd pain, CP, N/V/C/D, and vision changes    Except for HPI Objective:   No results found. Recent Labs    11/04/22 0554  WBC 7.6  HGB 9.9*  HCT 31.1*  PLT 308       Recent Labs    11/04/22 0554  NA 137  K 6.1*  CL 102  CO2 25  GLUCOSE 93  BUN 31*  CREATININE 1.64*  CALCIUM 8.8*      Intake/Output Summary (Last 24 hours) at 11/04/2022 0951 Last data filed at 11/04/2022 0829 Gross per 24 hour  Intake 1380 ml  Output --  Net 1380 ml         Physical Exam: Vital Signs Blood pressure 138/70, pulse 66, temperature (!) 97.4 F (36.3 C), temperature source Oral, resp. rate 18, height 5' 4"$  (1.626 m), weight 129 kg, last menstrual period 08/17/2012, SpO2 100 %.  General: No acute distress Mood and affect are appropriate Heart: Regular rate and rhythm no rubs murmurs or extra sounds Lungs: Clear to auscultation, breathing unlabored, no rales or wheezes Abdomen: Positive bowel sounds, soft nontender to palpation, nondistended Extremities: No clubbing, cyanosis, or edema Skin: No evidence of breakdown, no evidence of rash    MS: much less  TTP L low back- lumbar paraspinals Skin; dry skin on arms/legs- cannot assess buttocks today Prior exams: MS: less TTP in R knee- effusion slightly better; B/ hand shaking- due to weakness sin hands/Ue's.  Neuro: Alert and oriented x 3. Normal insight and awareness. Intact Memory. Normal language and speech. Cranial nerve exam unremarkable. Stocking glove sensory loss BLE L>>R. BUE both hands/wrists. UE strength 4/5 except distal is 4-/5 LE's- HF 2+/5; KE 4-/5; DF 3+ to 4-/5 and PF 4- to 4/5 B/L  Skin: multiple IV's and blood draw tape areas- MS: R knee- still has mild effusion- TTP diffusely.    Assessment/Plan: 1. Functional deficits which require 3+  hours per day of interdisciplinary therapy in a comprehensive inpatient rehab setting. Physiatrist is providing close team supervision and 24 hour management of active medical problems listed below. Physiatrist and rehab team continue to assess barriers to discharge/monitor patient progress toward functional and medical goals  Care Tool:  Bathing    Body parts bathed by patient: Right arm, Left arm, Chest, Abdomen, Front perineal area, Buttocks, Right upper leg, Left upper leg, Face, Right lower leg, Left lower leg   Body parts bathed by helper: Left lower leg, Right lower leg     Bathing assist Assist Level: Minimal Assistance - Patient > 75%     Upper Body Dressing/Undressing Upper body dressing   What is the patient wearing?: Pull over shirt    Upper body assist Assist Level: Independent with assistive device    Lower Body Dressing/Undressing Lower body dressing      What is the patient wearing?: Underwear/pull up, Pants     Lower body assist Assist for lower body dressing: Minimal Assistance - Patient > 75%     Toileting Toileting    Toileting assist Assist for toileting:  Contact Guard/Touching assist     Transfers Chair/bed transfer  Transfers assist     Chair/bed transfer assist level: Contact Guard/Touching assist     Locomotion Ambulation   Ambulation assist   Ambulation activity did not occur: Safety/medical concerns  Assist level: 2 helpers Assistive device: Lite Gait Max distance: 32   Walk 10 feet activity   Assist  Walk 10 feet activity did not occur: Safety/medical concerns    Assistive device: Lite Gait   Walk 50 feet activity   Assist Walk 50 feet with 2 turns activity did not occur: Safety/medical concerns         Walk 150 feet activity   Assist Walk 150 feet activity did not occur: Safety/medical concerns         Walk 10 feet on uneven surface  activity   Assist Walk 10 feet on uneven surfaces activity did not  occur: Safety/medical concerns         Wheelchair     Assist Is the patient using a wheelchair?: Yes Type of Wheelchair: Power    Wheelchair assist level: Supervision/Verbal cueing Max wheelchair distance: 150    Wheelchair 50 feet with 2 turns activity    Assist        Assist Level: Supervision/Verbal cueing   Wheelchair 150 feet activity     Assist      Assist Level: Supervision/Verbal cueing   Blood pressure 138/70, pulse 66, temperature (!) 97.4 F (36.3 C), temperature source Oral, resp. rate 18, height 5' 4"$  (1.626 m), weight 129 kg, last menstrual period 08/17/2012, SpO2 100 %.  Medical Problem List and Plan: 1. 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 More training with chair so she can get out of tight spaces  D/c 2/21  Con't CIR PT and OT 2.  Antithrombotics: -DVT/anticoagulation:  Pharmaceutical: Lovenox start 1/17 2/15- will switch to Eliquis on d/c to 2.5 mg BID for 1 more month.              -antiplatelet therapy: none   3. 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 Changed flexeril to 10 mg TID- not prn  2/13- Back pain gradually improving- did an exercise with therapy that hurt back- was stopped.   2/15- pain worse- will add Diclofenac 50 mg BID for  now as well as TrP injections today   4. Anixety: continue celexa             -antipsychotic agents: n/a   1/24- anxiety is limiting therapy- will add Buspar and Celexa for anxiety  1/28 a little frustrated by ongoing deficits  1/30- thinks need SNF due to deficits- frustrated by this  2/1- will increase Buspar  2/7- increased Celexa to 40 mg daily 5. Neuropsych/cognition: This patient is capable of making decisions on her own behalf.   6. Skin/Wound Care: Routine skin care checks   7. Fluids/Electrolytes/Nutrition: Routine Is and Os and follow-up chemistries             -protein supplements for hypoalbuminemia             -continue folic acid, 123456             -continue MVI   1/18- will check with pharmacy if needs B12 shots AND daily pills.   8: Chronic back pain/radiculopathy:  -continue Dilaudid 4 mg q 6 hours - is her home medicine (PTA per Dr. [name])             -continue gabapentin 600 mg TID             -continue Lidoderm patch daily             -continue Flexeril 10 mg TID prn   1/26- will d/c Lidoderm- not using.  Otherwise, pain controlled- con't regimen  2/16- TrP injections yesterday- back doing SO much better 9: Urinary retention: Foley replaced on 1/15             -treated for UTI with Rocephin for  7 days   1/18- will start Flomax 0.4 mg qsupper and will try and remove Foley in AM- explained will need in/out caths likely before bladder muscle kicks in and she can void.   1/19- foley out this Am at 6am- hasn't voided yet- added bladder scans q6 hours and then cath of volumes /350cc- pt made aware can take a few days to kick in/the flomax. Has voided some and cathed x1-   1/20- no more caths  require-d voiding on her own- con't flomax  1/22- d/c bladder scans since voiding well- only required 1 cath since foley removed fyi.   1/23- voiding well- d/c bladder scans  2/6- resolved? But Cr up to 2.29- and BUN 26-   2/7- off Lasix  2/8- Cr 1.21 and Bun 27- sotpped IVFs  2/12- labs pending  2/13- Cr 1.12 and BUN 16-   2/14- not drinking well- will con't to push water intake.   2/15- Cr down to 1.02 and BUN 20 today- is doing better- push fluids daily.     Latest Ref Rng & Units 11/04/2022    5:54 AM 10/31/2022    6:37 AM 10/28/2022    8:18 AM  BMP  Glucose 70 - 99 mg/dL 93  109  110   BUN 6 - 20 mg/dL 31  20  16   $ Creatinine 0.44 - 1.00 mg/dL 1.64  1.02  1.12   Sodium 135 - 145 mmol/L 137  134  136   Potassium 3.5 - 5.1 mmol/L 6.1  4.3  4.6   Chloride 98 - 111 mmol/L 102  101  103   CO2 22 - 32 mmol/L 25  22  26   $ Calcium 8.9 - 10.3 mg/dL 8.8  8.8  9.1   11/02/22- AKI although intake  has improved and pt off , likely NSAID will stop  10: Hypertension: monitor TID and prn             -continue Lasix 40 mg daily             -continue Lopressor 50 mg BID             -add magnesium gluconate 2110m HS   1/22- BP controlled- con't regimen  1/29- Lasix 40 mg BID- but home dose 40 mg daily- since Cr 1.26 up from 0.75 last week, will reduce to daily. And recheck Thursday  2/1- new Cr 1.10- and BUN better at 17- from 22- will recheck Monday- sinc eon lower dose of Lasix- no increase in LE edema  2/6- Cr up to 2.29- will recheck today and in AM- says she's drinking- will recheck today and if still elevated, we need IVFs 75cc/hour. If is still elevated, will also stop Lasix for now.   2/7- down to 1.82- will do IVF's another 24 hours and recheck in AM  2/8- will stop IVFs- off Lasix- no LE edema- needs weight- will order  2/10 elevated, possibly pain related, continue to monitor for now  2/16- BP usually controlled- is 146/61- con't to monitor for trend    11/04/2022    6:06 AM 11/03/2022     7:36 PM 11/03/2022    1:13 PM  Vitals with BMI  Systolic [id number] Diastolic 70 87 80  Pulse 66 70 65     11: GERD?/GI prophylaxis: continue Protonix   12: B12 deficiency: continue supplementation   1/19- says itches with B12 shots, but just takes benadryl 13: Class 3 obesity: BMI = 48; weight loss counseling   1/24- BMI down to 46.62  2/1- BMI 47.15  2/7- pt eating a lot of junk food per staff- 1 lb M&M's eaten in 2 days; having a lot of fast food brought by husband 14: Hyponatremia, resolved     15: Elevated serum creatinine/AKI; 0.75 1/16 to 1.06 on 1/17             -follow-up BMP   1/18- stable- con't to monitor weekly and prn  1/29- Cr 1.16- from 0.75- will reduce Lasix  2/1- labs today- Cr 1.10- and no LE edema on exam  2/2- not drinking- per pt- hates water- encouraged Mio/to add flavor to water and to drink 6-8 cups/day.   2/7- Cr 1.82 down from 2.29 and BUN 37- off Lasix as of this AM- will give another 24 hours of IVFs at 75cc/hour and recheck in AM  2/8- stopped IVFs since Cr down to 1.20 and BUN down to 27- wil recheck in AM to make sure stays down. 2/9- Cr 0.99 and BUN 18- no IVFs in last 24 hours- so happy with results- encouraged pt to keep drinking water. Recheck Monday  2/13- Cr 1.12- is inching back up- BUN stable- will recheck Thursday since is back up some- since 1.12, no intervention right this moment- encourage pt to drink still.  2/15- Cr 1.02- doing better 16: GBS: continue to monitor NIFs/VIC q 12 hours             -continue gabapentin   1/19- will increase gabapaentin to 900 mg TID  1/23- slightly better- con't regimen  2/1- tingling/burning is gone on Gabapentin  2/4: NIFs -40   2/7- NIF's stable at -40  2/8- will stop Vital capacity and NIF since been good for 1 week- will  restart if Sx's get worse.   2/19 Slow improvement with strength , sensation still absent in UEs 17: Glaucoma: continue latanoprost eye drops   18: Tobacco use: cessation  counseling   19. Constipation  1/18- will con't Senna per pt request- to not increase- but if no BM by tomorrow, will try Sorbitol.   2/2 regular BM's per pt 2/3 - large BM  2/6- LBM 2 days ago- if no result by tomorrow, will give Sorbitol  2/9- finally got cleaned out last night after Mylanta-  20.. insomnia  1/20- will start Melatonin 5 mg QHS prn- don't want trazodone since can cause urinary retention  1/22- sleeping a little better 21. Anxiety  1/24- added Buspar 5 mg TID- will also add Celexa for mood/anxiety prevention 20 mg daily. 1/25- no side effects so far.    1/26-27- feels anxiety is do ing MUCH better per pt. 2/1- anxiety still limiting therapy per PT/OT- will increase Buspar to 10 mg TID 2/7- pt says anxiety still limiting- so does therapy- will increase Celexa to 40 mg daily.  22. R knee buckling/pain  2/1- got brace- wasn't real effective/"is thin"- I educated pt another type knee brace would be too heavy for her to lift with her weakness- will call ortho to see if any ideas since hurting MORE overnight- R knee- -ever since fall over weekend on R knee directly.   2/2- Xray just shows severe knee DJD- need for Knee replacement- no fractures- will add Diclofenac 50 mg TID x 5 days to help pain. Since her renal function slightly impaired (not drinking water), will not do longer than 5 days.   2/9- pain has been doing bette rin R knee 23. Hyperkalemia  2/7- K+ down to 5.1- if still elevated tomorrow, then will use Lokelma  2/9- K+ 4.9-   2/9- K+ 5.0- pt doesn't want Loklema since just "got cleaned out".   Recheck tomorrow     2/13- K+ 4.6  2/16- K+ 4.3 2/19 K+ 6.1 - AKI due to NSAIDs stop diclofenac , fluid bolus 1087m x 1 , lokelma Repeat BMET in am  If BP up and intake ok may ned to resume Lasix at low dose 24. Abd pain  2/9- given mylanta- but had Bms "all night"- so likely was full of stool causing abd pain- eating more than meals from hospital- a lot of snacks from  outside.   2/10 improving, had multiple bowel movements this morning  2/11 Additional BM today, improved 25. Dry skin  2/12- added eucerin for dry skin BID  2/14- working better  LOS: 33 days [place] Norina Cowper 11/04/2022, 9:51 AM

## 2022-11-05 ENCOUNTER — Other Ambulatory Visit (HOSPITAL_COMMUNITY): Payer: Self-pay

## 2022-11-05 DIAGNOSIS — N179 Acute kidney failure, unspecified: Secondary | ICD-10-CM

## 2022-11-05 LAB — BASIC METABOLIC PANEL
Anion gap: 8 (ref 5–15)
BUN: 24 mg/dL — ABNORMAL HIGH (ref 6–20)
CO2: 27 mmol/L (ref 22–32)
Calcium: 8.7 mg/dL — ABNORMAL LOW (ref 8.9–10.3)
Chloride: 104 mmol/L (ref 98–111)
Creatinine, Ser: 1.3 mg/dL — ABNORMAL HIGH (ref 0.44–1.00)
GFR, Estimated: 48 mL/min — ABNORMAL LOW (ref >60)
Glucose, Bld: 95 mg/dL (ref 70–99)
Potassium: 5.5 mmol/L — ABNORMAL HIGH (ref 3.5–5.1)
Sodium: 139 mmol/L (ref 135–145)

## 2022-11-05 MED ORDER — BUSPIRONE HCL 10 MG PO TABS
10.0000 mg | ORAL_TABLET | Freq: Three times a day (TID) | ORAL | 0 refills | Status: DC
  Filled 2022-11-05: qty 90, 30d supply, fill #0

## 2022-11-05 MED ORDER — HYDROMORPHONE HCL 4 MG PO TABS: 4.0000 mg | ORAL_TABLET | Freq: Four times a day (QID) | ORAL | 0 refills | Status: DC | PRN

## 2022-11-05 MED ORDER — GABAPENTIN 300 MG PO CAPS
900.0000 mg | ORAL_CAPSULE | Freq: Three times a day (TID) | ORAL | 0 refills | Status: DC
  Filled 2022-11-05: qty 90, 10d supply, fill #0

## 2022-11-05 MED ORDER — HYDROCERIN EX CREA: 1.0000 | TOPICAL_CREAM | Freq: Two times a day (BID) | CUTANEOUS | 0 refills | Status: AC

## 2022-11-05 MED ORDER — CYCLOBENZAPRINE HCL 10 MG PO TABS
10.0000 mg | ORAL_TABLET | Freq: Three times a day (TID) | ORAL | 0 refills | Status: DC
  Filled 2022-11-05: qty 90, 30d supply, fill #0

## 2022-11-05 MED ORDER — MAGNESIUM OXIDE 400 MG PO TABS
250.0000 mg | ORAL_TABLET | Freq: Every day | ORAL | 0 refills | Status: DC
  Filled 2022-11-05: qty 15, 30d supply, fill #0

## 2022-11-05 MED ORDER — TAMSULOSIN HCL 0.4 MG PO CAPS
0.4000 mg | ORAL_CAPSULE | Freq: Every day | ORAL | 0 refills | Status: DC
  Filled 2022-11-05: qty 30, 30d supply, fill #0

## 2022-11-05 MED ORDER — SENNOSIDES-DOCUSATE SODIUM 8.6-50 MG PO TABS: 1.0000 | ORAL_TABLET | Freq: Two times a day (BID) | ORAL | Status: AC

## 2022-11-05 MED ORDER — CITALOPRAM HYDROBROMIDE 40 MG PO TABS
40.0000 mg | ORAL_TABLET | Freq: Every day | ORAL | 0 refills | Status: DC
  Filled 2022-11-05: qty 30, 30d supply, fill #0

## 2022-11-05 MED ORDER — SODIUM ZIRCONIUM CYCLOSILICATE 10 G PO PACK
10.0000 g | PACK | Freq: Once | ORAL | Status: AC
  Administered 2022-11-05: 10 g via ORAL
  Filled 2022-11-05: qty 1

## 2022-11-05 MED ORDER — SODIUM CHLORIDE 0.9 % IV SOLN: INTRAVENOUS | Status: DC

## 2022-11-05 MED ORDER — SODIUM ZIRCONIUM CYCLOSILICATE 10 G PO PACK: 10.0000 g | PACK | Freq: Once | ORAL | 0 refills | Status: AC

## 2022-11-05 MED ORDER — PANTOPRAZOLE SODIUM 40 MG PO TBEC
40.0000 mg | DELAYED_RELEASE_TABLET | Freq: Every day | ORAL | 0 refills | Status: DC
  Filled 2022-11-05: qty 30, 30d supply, fill #0

## 2022-11-05 MED ORDER — SODIUM CHLORIDE 0.9 % IV SOLN: 50.0000 mL | INTRAVENOUS | 0 refills | Status: DC

## 2022-11-05 MED ORDER — METOPROLOL TARTRATE 50 MG PO TABS
50.0000 mg | ORAL_TABLET | Freq: Two times a day (BID) | ORAL | 0 refills | Status: DC
  Filled 2022-11-05: qty 60, 30d supply, fill #0

## 2022-11-05 MED ORDER — APIXABAN 2.5 MG PO TABS
2.5000 mg | ORAL_TABLET | Freq: Two times a day (BID) | ORAL | 0 refills | Status: DC
  Filled 2022-11-05: qty 60, 30d supply, fill #0

## 2022-11-05 NOTE — Progress Notes (Incomplete)
Inpatient Rehabilitation Discharge Medication Review by a Pharmacist  A complete drug regimen review was completed for this patient to identify any potential clinically significant medication issues.  High Risk Drug Classes Is patient taking? Indication by Medication  Antipsychotic Yes Buspar - anxiety  Anticoagulant Yes Apixaban - VTE prophylaxis  Antibiotic No   Opioid Yes Dilaudid prn severe pain  Antiplatelet No   Hypoglycemics/insulin No   Vasoactive Medication Yes Metoprolol tartrate - hypertension  Chemotherapy No   Other Yes Celexa- anxiety Flomax-urinary retention Protonix - acid reflux Senokot - bowel regimen 123456, folic acid, MVI, MagOx - vitamins/supplements Flexeril - pain/muscle spasms Gabapentin - pain Cosopt, Xalatan - glaucoma Eucerin cream - skin care     Type of Medication Issue Identified Description of Issue Recommendation(s)  Drug Interaction(s) (clinically significant)     Duplicate Therapy     Allergy     No Medication Administration End Date     Incorrect Dose     Additional Drug Therapy Needed     Significant med changes from prior encounter (inform family/care partners about these prior to discharge).  Communicate medication changes with patient/family at discharge  Other       Clinically significant medication issues were identified that warrant physician communication and completion of prescribed/recommended actions by midnight of the next day:  No  Name of provider notified for urgent issues identified:    Provider Method of Notification:     Pharmacist comments:    Time spent performing this drug regimen review (minutes): 20

## 2022-11-05 NOTE — Progress Notes (Signed)
Occupational Therapy Discharge Summary  Patient Details  Name: AVALEA MAZOR MRN: [id number] Date of Birth: [birth year]  Date of Discharge from OT service:November 05, 2022  Today's Date: 11/05/2022 OT Individual Time: 0805-0901 OT Individual Time Calculation (min): 56 min   Today's Date: 11/05/2022 OT Individual Time: 1335-1445 OT Individual Time Calculation (min): 70 min   Patient has met 8 of 8 long term goals due to improved activity tolerance, improved balance, and ability to compensate for deficits.  Patient to discharge at overall Modified Independent with use of PWC and lift equipment.  Patient's care partners  are willing and capable  to provide the necessary physical assistance at discharge.    Recommendation:  Patient will benefit from ongoing skilled OT services in home health setting to continue to advance functional skills in the area of BADL, iADL, and Reduce care partner burden.  Equipment: TTB &  Wide drop-arm BSC  Reasons for discharge: discharge from hospital  Patient/family agrees with progress made and goals achieved: Yes  OT Discharge Precautions/Restrictions  Precautions Precautions: Fall Required Braces or Orthoses: Other Brace Other Brace: R-knee compression sleeve for pain and comfort. Restrictions Weight Bearing Restrictions: No ADL ADL Equipment Provided: Long-handled sponge, Reacher, Sock aid (Trialed use in sessions, handouts provided for patient to purchase at own discretion.) Eating: Modified independent Where Assessed-Eating: Edge of bed Grooming: Modified independent Where Assessed-Grooming: Sitting at sink Upper Body Bathing: Modified independent Where Assessed-Upper Body Bathing: Shower Lower Body Bathing: Modified independent Where Assessed-Lower Body Bathing: Shower Upper Body Dressing: Modified independent (Device) Where Assessed-Upper Body Dressing: Edge of bed Lower Body Dressing: Supervision/safety Where Assessed-Lower Body  Dressing: Edge of bed, Other (Comment) (With use of stedy.) Toileting: Modified independent Where Assessed-Toileting: Glass blower/designer: Contact guard, Other (comment) (With use of stedy.) Toilet Transfer Method: Other (comment) (With use of stedy.) Science writer: Extra wide bedside commode Tub/Shower Transfer: Unable to assess Social research officer, government: Contact guard (With use of stedy.) Social research officer, government Method:  (With use of stedy.) ADL Comments: Pt with severe LB weakness >UB weakness. Use of STEDY OOB to 3 in 1 over toilet then sink side bathing in STEDY. Has Foley cath for voiding and max A for peri and buttocks hygeine as unable to stand or manage care seated due to weakness and body habitus Vision Baseline Vision/History: 1 Wears glasses Patient Visual Report: No change from baseline Vision Assessment?: No apparent visual deficits Perception  Perception: Within Functional Limits Praxis Praxis: Intact Cognition Cognition Overall Cognitive Status: Within Functional Limits for tasks assessed Arousal/Alertness: Awake/alert Orientation Level: Person;Place;Situation Person: Oriented Place: Oriented Situation: Oriented Memory: Appears intact Awareness: Appears intact Problem Solving: Appears intact Safety/Judgment: Appears intact Brief Interview for Mental Status (BIMS) Repetition of Three Words (First Attempt): 3 Temporal Orientation: Year: Correct Temporal Orientation: Month: Accurate within 5 days Temporal Orientation: Day: Correct Recall: "Sock": Yes, no cue required Recall: "Blue": Yes, no cue required Recall: "Bed": Yes, no cue required BIMS Summary Score: 15 Sensation Sensation Light Touch: Impaired Detail Peripheral sensation comments: L LE absent sensation except S2, R LE WFL except L5 absent Light Touch Impaired Details: Impaired LLE;Impaired RUE;Impaired LUE;Impaired RLE Hot/Cold: Appears Intact Proprioception: Impaired  Detail Proprioception Impaired Details: Impaired LLE Stereognosis: Impaired by gross assessment;Impaired Detail Stereognosis Impaired Details: Impaired RUE;Impaired LUE Coordination Gross Motor Movements are Fluid and Coordinated: No Fine Motor Movements are Fluid and Coordinated: No Coordination and Movement Description: Generalized weakness, combination of fluctuating ataxia/dysmetria impacting UB/LB voluntary movements. Finger Nose Finger Test:  L UE dysmetria/ataxia and bilateral intention tremor Heel Shin Test: R LE WFL, L LE unable to perform 2/2 ataxia and ROM deficits Motor  Motor Motor: Ataxia Motor - Skilled Clinical Observations: proximal weakness and ataxia 2/2 GBS Motor - Discharge Observations: Generalized weakness with decreased B-shoulder ROM. Mobility  Bed Mobility Bed Mobility: Rolling Right;Rolling Left;Sit to Supine;Supine to Sit Rolling Right: Independent with assistive device Rolling Left: Independent with assistive device Right Sidelying to Sit: Independent with assistive device Supine to Sit: Independent with assistive device Sit to Supine: Independent with assistive device Sit to Sidelying Right: Independent with assistive device Sit to Sidelying Left: Independent with assistive device Transfers Sit to Stand: Contact Guard/Touching assist Stand to Sit: Contact Guard/Touching assist  Trunk/Postural Assessment  Cervical Assessment Cervical Assessment: Within Functional Limits Thoracic Assessment Thoracic Assessment: Within Functional Limits Lumbar Assessment Lumbar Assessment: Within Functional Limits Postural Control Postural Control: Deficits on evaluation Righting Reactions: impaired Protective Responses: impaired  Balance Balance Balance Assessed: Yes Static Sitting Balance Static Sitting - Balance Support: Feet supported Static Sitting - Level of Assistance: 7: Independent Dynamic Sitting Balance Dynamic Sitting - Balance Support: During  functional activity Dynamic Sitting - Level of Assistance: 6: Modified independent (Device/Increase time) Dynamic Sitting - Balance Activities: Reaching for objects;Trunk control activities;Reaching across midline Static Standing Balance Static Standing - Balance Support: Bilateral upper extremity supported;During functional activity Static Standing - Level of Assistance: 5: Stand by assistance (CGa) Dynamic Standing Balance Dynamic Standing - Balance Support: During functional activity;Bilateral upper extremity supported Dynamic Standing - Level of Assistance: 5: Stand by assistance (CGA) Dynamic Standing - Balance Activities: Lateral lean/weight shifting;Reaching for objects Extremity/Trunk Assessment RUE Assessment RUE Assessment: Exceptions to WFL-decreased shoulder Abduction (strength=4-)  LUE Assessment LUE Assessment: Exceptions to WFL-decreased shoulder Abduction (strength=3+)  Skilled Intervention:  Session 1: Pt received sitting EOB for skilled OT session with focus on DME recommendations. Pt agreeable to interventions, demonstrating overall pleasant mood. Pt with un-rated pain, stating "I'm doing okay." OT offering intermediate rest breaks and positioning suggestions throughout session to address pain/fatigue and maximize participation/safety in session.   Pt uses beginning of session to purchase TTB. Pt then performs peri-area hygiene with set-up A and bridging in bed, requiring A to elevate BLEs. Pt dons LB garments with overall stand-by assist and use of reacher, standing in stedy to don garments over bottom/hips with CGA. Pt dons t-shirt with modified independence.    Pt performs EOB>PWC transfer with use of stedy.    Pt remained sitting in [place] with all immediate needs met at end of session. Pt continues to be appropriate for skilled OT intervention to promote further functional independence.    Session 2: Pt received sitting in [facility] for skilled OT session with focus on IADL  retraining and UE strengthening. Pt agreeable to interventions, demonstrating overall pleasant mood. Pt with un-rated pain. OT offering intermediate rest breaks and positioning suggestions throughout session to address pain/fatigue and maximize participation/safety in session.    Pt self-drives/maneuvers PWC to all therapy areas with supervision. Pt accidentally runs PWC into door and edge of bed in simulated apartment. Pt re-educated on importance of managing PWC speed with changing environments. In simulated kitchen environment, pt and OT discuss home-kitchen layout and organization. Pt completes simple meal-prep activity with supervision and cuing for most effective PWC placement. Pt and OT discuss safety awareness with regards to decreased sensation in hands.   In therapy gym, pt participates in series of UE strengthening exercises:   -Chest-level Ab/adduction -  Diagonal Ab/adduction  -Bicep curls -Punches    Pt completes 1-2 set/~10 reps of each exercise with yellow theraband, requiring multi-modal cuing for correct form.   Pt remained sitting in [place] with all immediate needs met at end of session. Pt continues to be appropriate for skilled OT intervention to promote further functional independence.   Maudie Mercury, OTR/L, MSOT  11/05/2022, 11:51 AM

## 2022-11-05 NOTE — Plan of Care (Signed)
  Problem: RH BOWEL ELIMINATION Goal: RH STG MANAGE BOWEL WITH ASSISTANCE Description: STG Manage Bowel with min Assistance. Outcome: Not Progressing; LBM 2/18;

## 2022-11-05 NOTE — Patient Care Conference (Signed)
Inpatient RehabilitationTeam Conference and Plan of Care Update Date: 11/05/2022   Time: 11:42 AM    Patient Name: Dana [name]      Medical Record Number: [id number]  Date of Birth: [birth year] Sex: Female         Room/Bed: 4W04C/4W04C-01 Payor Info: Payor: Theme park manager / Plan: [place] / Product Type: *No Product type* /    Admit Date/Time:  10/02/2022  1:50 PM  Primary Diagnosis:  Guillain Barr syndrome [facility])  Hospital Problems: Principal Problem:   Guillain Barr syndrome [facility])    Expected Discharge Date: Expected Discharge Date: 11/06/22  Team Members Present: Physician leading conference: Dr. Alger Simons Social Worker Present: Ovidio Kin, LCSW Nurse Present: Tacy Learn, RN PT Present: Verl Dicker, PT OT Present: Other (comment) (Fancy Pachon-Marin, OT) PPS Coordinator present : Gunnar Fusi, SLP     Current Status/Progress Goal Weekly Team Focus  Bowel/Bladder    Continent B/B   Remain continent   Toilet every 4 hours and PRN    Swallow/Nutrition/ Hydration               ADL's   Set-up A UB, reacher for LB with CGA-Min A.   Directing use of lift equipment and overall Mod A + LRAD   DC 2/21    Mobility   min/mod bed, CGA STS, min stand pivot with HDRW,  gait x 40 ft HDRW,  PWC navigation CGA-S on indoor speed settings   min A / CGA  pt/fam education, standing balance, coordination, d/c planning    Communication                Safety/Cognition/ Behavioral Observations               Pain     Pain much bette after steroid injections  Less than 4 with PRN medications  Assess every 4 hours and PRN    Skin    Skin is CDI   Skin to remain intact  Assess every shift and PRN      Discharge Planning:  Husband came in and plan is home tomorrow-last covered day via insurance. Pt is happy tp be going home and not to a facility. Has private duty list for hired assist and husand was to meet with their children  regarding assisting. [place] chair to be delivered to home today and pt to go home via non-emergency ambulance   Team Discussion: GBS. Continent B/B. Pain is reported much bette after steroid injections. Skin is CDI. Electrolyte repletion.   Patient on target to meet rehab goals: Meeting goals   *See Care Plan and progress notes for long and short-term goals.   Revisions to Treatment Plan:  Monitor labs, medication adjustments  Teaching Needs: Medications, safety, self care, diet, gait/transfer training, etc   Current Barriers to Discharge: Decreased caregiver support and Weight  Possible Resolutions to Barriers: Family education, nursing education, order recommended DME     Medical Summary Current Status: GBS, still with pain. HTN borderline. AKI--  Barriers to Discharge: Medical stability;Renal Insufficiency/Failure   Possible Resolutions to [employer] Focus: NSAID stopped, fluid resuscitation, serial labs, IVF as needed   Continued Need for Acute Rehabilitation Level of Care: The patient requires daily medical management by a physician with specialized training in physical medicine and rehabilitation for the following reasons: Direction of a multidisciplinary physical rehabilitation program to maximize functional independence : Yes Medical management of patient stability for increased activity during participation in an intensive rehabilitation regime.:  Yes Analysis of laboratory values and/or radiology reports with any subsequent need for medication adjustment and/or medical intervention. : Yes   I attest that I was present, lead the team conference, and concur with the assessment and plan of the team.   Ernest Pine 11/05/2022, 4:22 PM

## 2022-11-05 NOTE — Progress Notes (Addendum)
The following equipment is recommended by occupational therapy to increase pt's ability to perform ADL and decr burden of care: Wide drop-arm BSC for lateral leans and clothing management.  Maudie Mercury, OTR/L, MSOT

## 2022-11-05 NOTE — Plan of Care (Signed)
  Problem: RH Furniture Transfers Goal: LTG Patient will perform furniture transfers w/assist (OT/PT) Description: LTG: Patient will perform furniture transfers  with assistance (OT/PT). Outcome: Completed/Met   Problem: RH Balance Goal: LTG: Patient will maintain dynamic sitting balance (OT) Description: LTG:  Patient will maintain dynamic sitting balance with assistance during activities of daily living (OT) Outcome: Completed/Met Goal: LTG Patient will maintain dynamic standing with ADLs (OT) Description: LTG:  Patient will maintain dynamic standing balance with assist during activities of daily living (OT)  Outcome: Completed/Met   Problem: Sit to Stand Goal: LTG:  Patient will perform sit to stand in prep for activites of daily living with assistance level (OT) Description: LTG:  Patient will perform sit to stand in prep for activites of daily living with assistance level (OT) Outcome: Completed/Met   Problem: RH Grooming Goal: LTG Patient will perform grooming w/assist,cues/equip (OT) Description: LTG: Patient will perform grooming with assist, with/without cues using equipment (OT) Outcome: Completed/Met   Problem: RH Bathing Goal: LTG Patient will bathe all body parts with assist levels (OT) Description: LTG: Patient will bathe all body parts with assist levels (OT) Outcome: Completed/Met   Problem: RH Dressing Goal: LTG Patient will perform upper body dressing (OT) Description: LTG Patient will perform upper body dressing with assist, with/without cues (OT). Outcome: Completed/Met Goal: LTG Patient will perform lower body dressing w/assist (OT) Description: LTG: Patient will perform lower body dressing with assist, with/without cues in positioning using equipment (OT) Outcome: Completed/Met   Problem: RH Toileting Goal: LTG Patient will perform toileting task (3/3 steps) with assistance level (OT) Description: LTG: Patient will perform toileting task (3/3 steps) with  assistance level (OT)  Outcome: Completed/Met   Problem: RH Simple Meal Prep Goal: LTG Patient will perform simple meal prep w/assist (OT) Description: LTG: Patient will perform simple meal prep with assistance, with/without cues (OT). Outcome: Completed/Met   Problem: RH Toilet Transfers Goal: LTG Patient will perform toilet transfers w/assist (OT) Description: LTG: Patient will perform toilet transfers with assist, with/without cues using equipment (OT) Outcome: Completed/Met   Problem: RH Tub/Shower Transfers Goal: LTG Patient will perform tub/shower transfers w/assist (OT) Description: LTG: Patient will perform tub/shower transfers with assist, with/without cues using equipment (OT) Outcome: Completed/Met

## 2022-11-05 NOTE — Progress Notes (Signed)
Physical Therapy Discharge Summary  Patient Details  Name: Dana [name] MRN: [id number] Date of Birth: June 25, 1966  Date of Discharge from PT service:November 05, 2022  Today's Date: 11/05/2022 PT Individual Time: 1015-1125 PT Individual Time Calculation (min): 70 min    Patient has met 6 of 11 long term goals due to improved activity tolerance, improved balance, improved postural control, increased strength, decreased pain, ability to compensate for deficits, improved awareness, and improved coordination.  Patient to discharge at a wheelchair level Supervision.   Patient's spouse participated in family education and demonstrates competency with providing necessary assistance with mobility.   Reasons goals not met: Pt with anxiety and fear of falling regarding mobility along with right knee pain, ataxia and weakness. Pt continues to require min A for gait and max distance 40 ft   Recommendation:  Patient will benefit from ongoing skilled PT services in home health setting to continue to advance safe functional mobility, address ongoing impairments in strength, coordination, activity tolerance, pain management and balance, and minimize fall risk.  Equipment: PWC, hospital bed, STEDY sit to stand transfer equipment   Reasons for discharge: treatment goals met and discharge from hospital  Patient/family agrees with progress made and goals achieved: Yes  PT Discharge Pain Interference Pain Interference Pain Effect on Sleep: 3. Frequently Pain Interference with Therapy Activities: 3. Frequently Pain Interference with Day-to-Day Activities: 1. Rarely or not at all Vision/Perception  Vision - History Ability to See in Adequate Light: 0 Adequate Perception Perception: Within Functional Limits Praxis Praxis: Intact  Cognition Overall Cognitive Status: Within Functional Limits for tasks assessed Arousal/Alertness: Awake/alert Orientation Level: Oriented X4 Memory: Appears  intact Awareness: Appears intact Problem Solving: Appears intact Safety/Judgment: Appears intact Sensation Sensation Light Touch: Impaired Detail Peripheral sensation comments: L LE absent sensation except S2, R LE WFL except L5 absent Light Touch Impaired Details: Impaired LLE;Impaired RUE;Impaired LUE;Impaired RLE Hot/Cold: Appears Intact Proprioception: Impaired Detail Proprioception Impaired Details: Impaired LLE Stereognosis: Impaired by gross assessment;Impaired Detail Stereognosis Impaired Details: Impaired RUE;Impaired LUE Coordination Gross Motor Movements are Fluid and Coordinated: No Fine Motor Movements are Fluid and Coordinated: No Coordination and Movement Description: Generalized weakness, combination of fluctuating ataxia/dysmetria impacting UB/LB voluntary movements. Finger Nose Finger Test: L UE dysmetria/ataxia and bilateral intention tremor Heel Shin Test: R LE WFL, L LE unable to perform 2/2 ataxia and ROM deficits Motor  Motor Motor: Ataxia Motor - Skilled Clinical Observations: proximal weakness and ataxia 2/2 GBS Motor - Discharge Observations: Generalized weakness with decreased B-shoulder ROM.  Mobility Bed Mobility Bed Mobility: Rolling Right;Rolling Left;Sit to Supine;Supine to Sit Rolling Right: Independent with assistive device Rolling Left: Independent with assistive device Right Sidelying to Sit: Independent with assistive device Supine to Sit: Independent with assistive device Sit to Supine: Independent with assistive device Sit to Sidelying Right: Independent with assistive device Sit to Sidelying Left: Independent with assistive device Transfers Transfers: Transfer via Lift Equipment;Sit to Stand;Stand to Sit Sit to Stand: Contact Guard/Touching assist Stand to Sit: Contact Guard/Touching assist Transfer (Assistive device): Rolling walker Transfer via Lift Equipment: Probation officer Ambulation: Yes Gait Assistance: Minimal  Assistance - Patient > 75% Gait Distance (Feet): 40 Feet Assistive device: Rolling walker Gait Assistance Details: Verbal cues for safe use of DME/AE Gait Gait: Yes Gait Pattern: Impaired Gait Pattern: Shuffle;Antalgic Gait velocity: decreased Stairs / Additional Locomotion Stairs: No Architect: Yes Wheelchair Assistance: Chartered loss adjuster: Right upper extremity Wheelchair Parts Management: Supervision/cueing Distance: > 300 ft  Trunk/Postural Assessment  Cervical Assessment Cervical Assessment: Within Functional Limits Thoracic Assessment Thoracic Assessment: Within Functional Limits Lumbar Assessment Lumbar Assessment: Within Functional Limits Postural Control Postural Control: Deficits on evaluation Righting Reactions: impaired Protective Responses: impaired  Balance Balance Balance Assessed: Yes Static Sitting Balance Static Sitting - Balance Support: Feet supported Static Sitting - Level of Assistance: 7: Independent Dynamic Sitting Balance Dynamic Sitting - Balance Support: During functional activity Dynamic Sitting - Level of Assistance: 6: Modified independent (Device/Increase time) Dynamic Sitting - Balance Activities: Reaching for objects;Trunk control activities;Reaching across midline Static Standing Balance Static Standing - Balance Support: Bilateral upper extremity supported;During functional activity Static Standing - Level of Assistance: 5: Stand by assistance (CGa) Dynamic Standing Balance Dynamic Standing - Balance Support: During functional activity;Bilateral upper extremity supported Dynamic Standing - Level of Assistance: 5: Stand by assistance (CGA) Dynamic Standing - Balance Activities: Lateral lean/weight shifting;Reaching for objects Extremity Assessment  RLE Assessment RLE Assessment: Exceptions to [facility] General Strength Comments: grossly 4/5 except hip flexion/abd/add 3+/5 LLE  Assessment LLE Assessment: Exceptions to [facility] General Strength Comments: grossly 4/5 except hip flexion 2/5   Daily Treatment Session:   Pt received seated in PWC at bedside, agreeable to PT session and reports right knee pain, provided with cryotherapy for relief.   PT assessed pain interference, sensation, coordination, strength and balance in preparation for discharge. Pt navigated PWC >300 ft in session with supervision for safety with turns and obstacle navigation.   Pt ambulated 20 ft min A with HDRW, limited by right knee pain, provided pt with cryotherapy and seated rest break for relief.   Pt performed OH shoulder presses's with 5 pound weighted bar 2 x 6.   Pt navigated PWC to room with (S) and left at bedside with all needs in reach.   Verl Dicker Verl Dicker PT, DPT  11/05/2022, 10:37 AM

## 2022-11-05 NOTE — Progress Notes (Addendum)
Patient ID: Dana [name], female   DOB: [birth year], 57 y.o.   MRN: [id number]  Pt and husband feel she will need a wide drop-arm bedside commode for home. Tried to see if documentation would help with the coverage was told she does not meet the weight requirements. So would be private pay husband to pay the cost and then will be delivered to pt's room. Husband to pay so [employer] can deliver to room today. He received the hospital bed yesterday.   1:41 PM Updated both pt and husband regarding team conference progress this week in therapies and discharge tomorrow. Both feel ready and pt can;t wait to get home again. Had asked for children to come in and attend therapies with pt and they have not. Pt's grandson is in the hospital on peds unit and pt did go and see him

## 2022-11-05 NOTE — Progress Notes (Signed)
PROGRESS NOTE   Subjective/Complaints:  K+ and Cr improved.  Asks if IV can be removed. No additional concerns. DC home tomorrow.   ROS:  Pt denies SOB, abd pain, CP, N/V/C/D, and vision changes    Except for HPI Objective:   No results found. Recent Labs    11/04/22 0554  WBC 7.6  HGB 9.9*  HCT 31.1*  PLT 308        Recent Labs    11/04/22 0554 11/05/22 0529  NA 137 139  K 6.1* 5.5*  CL 102 104  CO2 25 27  GLUCOSE 93 95  BUN 31* 24*  CREATININE 1.64* 1.30*  CALCIUM 8.8* 8.7*       Intake/Output Summary (Last 24 hours) at 11/05/2022 0823 Last data filed at 11/05/2022 0804 Gross per 24 hour  Intake 1320 ml  Output --  Net 1320 ml         Physical Exam: Vital Signs Blood pressure (!) 148/76, pulse 77, temperature 98.3 F (36.8 C), temperature source Oral, resp. rate 17, height 5' 4"$  (1.626 m), weight 129 kg, last menstrual period 08/17/2012, SpO2 100 %.  General: No acute distress Mood and affect are appropriate Heart: Regular rate and rhythm no rubs murmurs or extra sounds Lungs: Clear to auscultation, breathing unlabored, no rales or wheezes Abdomen: Positive bowel sounds, soft nontender to palpation, nondistended Extremities: No clubbing, cyanosis, or edema Skin: No evidence of breakdown, no evidence of rash MS: much less  TTP L low back- lumbar paraspinals Skin; dry skin on arms/legs- cannot assess buttocks today Neuro: alert and oriented x3, CN 2-12 grossly intact  Prior exams: MS: less TTP in R knee- effusion slightly better; B/ hand shaking- due to weakness sin hands/Ue's.  Neuro: Alert and oriented x 3. Normal insight and awareness. Intact Memory. Normal language and speech. Cranial nerve exam unremarkable. Stocking glove sensory loss BLE L>>R. BUE both hands/wrists. UE strength 4/5 except distal is 4-/5 LE's- HF 2+/5; KE 4-/5; DF 3+ to 4-/5 and PF 4- to 4/5 B/L  Skin: multiple  IV's and blood draw tape areas- MS: R knee- still has mild effusion- TTP diffusely.    Assessment/Plan: 1. Functional deficits which require 3+ hours per day of interdisciplinary therapy in a comprehensive inpatient rehab setting. Physiatrist is providing close team supervision and 24 hour management of active medical problems listed below. Physiatrist and rehab team continue to assess barriers to discharge/monitor patient progress toward functional and medical goals  Care Tool:  Bathing    Body parts bathed by patient: Right arm, Left arm, Chest, Abdomen, Front perineal area, Buttocks, Right upper leg, Left upper leg, Face, Right lower leg, Left lower leg   Body parts bathed by helper: Left lower leg, Right lower leg     Bathing assist Assist Level: Minimal Assistance - Patient > 75%     Upper Body Dressing/Undressing Upper body dressing   What is the patient wearing?: Pull over shirt    Upper body assist Assist Level: Independent with assistive device    Lower Body Dressing/Undressing Lower body dressing      What is the patient wearing?: Underwear/pull up, Pants  Lower body assist Assist for lower body dressing: Minimal Assistance - Patient > 75%     Toileting Toileting    Toileting assist Assist for toileting: Contact Guard/Touching assist     Transfers Chair/bed transfer  Transfers assist     Chair/bed transfer assist level: Contact Guard/Touching assist     Locomotion Ambulation   Ambulation assist   Ambulation activity did not occur: Safety/medical concerns  Assist level: 2 helpers Assistive device: Lite Gait Max distance: 32   Walk 10 feet activity   Assist  Walk 10 feet activity did not occur: Safety/medical concerns    Assistive device: Lite Gait   Walk 50 feet activity   Assist Walk 50 feet with 2 turns activity did not occur: Safety/medical concerns         Walk 150 feet activity   Assist Walk 150 feet activity did  not occur: Safety/medical concerns         Walk 10 feet on uneven surface  activity   Assist Walk 10 feet on uneven surfaces activity did not occur: Safety/medical concerns         Wheelchair     Assist Is the patient using a wheelchair?: Yes Type of Wheelchair: Power    Wheelchair assist level: Supervision/Verbal cueing Max wheelchair distance: 150    Wheelchair 50 feet with 2 turns activity    Assist        Assist Level: Supervision/Verbal cueing   Wheelchair 150 feet activity     Assist      Assist Level: Supervision/Verbal cueing   Blood pressure (!) 148/76, pulse 77, temperature 98.3 F (36.8 C), temperature source Oral, resp. rate 17, height 5' 4"$  (1.626 m), weight 129 kg, last menstrual period 08/17/2012, SpO2 100 %.  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 More training with chair so she can get out of tight spaces  D/c 2/21  Con't CIR PT and OT  -Team conference today please see physician documentation under team conference tab.  2.  Antithrombotics: -DVT/anticoagulation:  Pharmaceutical: Lovenox start 1/17 2/15- will switch to Eliquis on d/c to 2.5 mg BID for 1 more month.              -antiplatelet therapy: none   3. 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 Changed flexeril to 10 mg TID- not prn  2/13- Back pain gradually improving- did an exercise with therapy that hurt back- was stopped.   2/15- pain worse- will add Diclofenac 50 mg BID for now as well as TrP injections today   4. Anixety: continue celexa             -antipsychotic agents: n/a   1/24- anxiety is limiting therapy- will add Buspar and Celexa for anxiety  1/28 a little frustrated by ongoing deficits  1/30- thinks need SNF due to deficits- frustrated by this  2/1- will increase Buspar  2/7- increased Celexa to 40 mg daily 5. Neuropsych/cognition: This patient is capable of making decisions on her own behalf.   6. Skin/Wound Care: Routine skin care checks   7. Fluids/Electrolytes/Nutrition: Routine Is and Os and follow-up chemistries             -protein supplements for hypoalbuminemia             -continue folic acid, 123456             -continue MVI   1/18- will check with pharmacy if needs B12 shots AND daily pills.   8: Chronic back pain/radiculopathy:  -continue Dilaudid 4 mg q 6 hours - is her home medicine (PTA per Dr. [name])             -continue gabapentin 600 mg TID             -continue Lidoderm patch daily             -continue Flexeril 10 mg TID prn   1/26- will d/c Lidoderm- not using.  Otherwise, pain controlled- con't regimen  2/16- TrP injections yesterday- back doing SO much better 9: Urinary retention: Foley replaced on 1/15             -treated for UTI with Rocephin for  7 days   1/18- will start Flomax 0.4 mg qsupper and will try and remove Foley in AM- explained will need in/out caths likely before bladder muscle kicks in and she can  void.   1/19- foley out this Am at 6am- hasn't voided yet- added bladder scans q6 hours and then cath of volumes /350cc- pt made aware can take a few days to kick in/the flomax. Has voided some and cathed x1-   1/20- no more caths require-d voiding on her own- con't flomax  1/22- d/c bladder scans since voiding well- only required 1 cath since foley removed fyi.   1/23- voiding well- d/c bladder scans  2/6- resolved? But Cr up to 2.29- and BUN 26-   2/7- off Lasix  2/8- Cr 1.21 and Bun 27- sotpped IVFs  2/12- labs pending  2/13- Cr 1.12 and BUN 16-   2/14- not drinking well- will con't to push water intake.   2/15- Cr down to 1.02 and BUN 20 today- is doing better- push fluids daily.     Latest Ref Rng & Units 11/05/2022    5:29 AM 11/04/2022    5:54 AM 10/31/2022    6:37 AM  BMP  Glucose 70 - 99 mg/dL 95  93  109   BUN 6 - 20 mg/dL 24  31  20   $ Creatinine 0.44 - 1.00 mg/dL 1.30  1.64  1.02   Sodium 135 - 145 mmol/L 139  137  134   Potassium 3.5 - 5.1 mmol/L 5.5  6.1  4.3  Chloride 98 - 111 mmol/L 104  102  101   CO2 22 - 32 mmol/L 27  25  22   $ Calcium 8.9 - 10.3 mg/dL 8.7  8.8  8.8   11/02/22- AKI although intake has improved and pt off , likely NSAID will stop  10: Hypertension: monitor TID and prn             -continue Lasix 40 mg daily             -continue Lopressor 50 mg BID             -add magnesium gluconate 236m HS   1/22- BP controlled- con't regimen  1/29- Lasix 40 mg BID- but home dose 40 mg daily- since Cr 1.26 up from 0.75 last week, will reduce to daily. And recheck Thursday  2/1- new Cr 1.10- and BUN better at 17- from 22- will recheck Monday- sinc eon lower dose of Lasix- no increase in LE edema  2/6- Cr up to 2.29- will recheck today and in AM- says she's drinking- will recheck today and if still elevated, we need IVFs 75cc/hour. If is still elevated, will also stop Lasix for now.   2/7- down to 1.82- will do IVF's another 24 hours and recheck in AM  2/8- will  stop IVFs- off Lasix- no LE edema- needs weight- will order  2/10 elevated, possibly pain related, continue to monitor for now  2/16- BP usually controlled- is 146/61- con't to monitor for trend  2/20 fair control, continue current    11/04/2022    7:46 PM 11/04/2022    1:15 PM 11/04/2022    6:06 AM  Vitals with BMI  Systolic [id number] Diastolic 76 77 70  Pulse 77 65 66     11: GERD?/GI prophylaxis: continue Protonix   12: B12 deficiency: continue supplementation   1/19- says itches with B12 shots, but just takes benadryl 13: Class 3 obesity: BMI = 48; weight loss counseling   1/24- BMI down to 46.62  2/1- BMI 47.15  2/7- pt eating a lot of junk food per staff- 1 lb M&M's eaten in 2 days; having a lot of fast food brought by husband 14: Hyponatremia, resolved     15: Elevated serum creatinine/AKI; 0.75 1/16 to 1.06 on 1/17             -follow-up BMP   1/18- stable- con't to monitor weekly and prn  1/29- Cr 1.16- from 0.75- will reduce Lasix  2/1- labs today- Cr 1.10- and no LE edema on exam  2/2- not drinking- per pt- hates water- encouraged Mio/to add flavor to water and to drink 6-8 cups/day.   2/7- Cr 1.82 down from 2.29 and BUN 37- off Lasix as of this AM- will give another 24 hours of IVFs at 75cc/hour and recheck in AM  2/8- stopped IVFs since Cr down to 1.20 and BUN down to 27- wil recheck in AM to make sure stays down. 2/9- Cr 0.99 and BUN 18- no IVFs in last 24 hours- so happy with results- encouraged pt to keep drinking water. Recheck Monday  2/13- Cr 1.12- is inching back up- BUN stable- will recheck Thursday since is back up some- since 1.12, no intervention right this moment- encourage pt to drink still.  2/15- Cr 1.02- doing better 2/20, IVF tonight NS, recheck tomorrow 16: GBS: continue to monitor NIFs/VIC q 12 hours             -  continue gabapentin   1/19- will increase gabapaentin to 900 mg TID  1/23- slightly better- con't regimen  2/1- tingling/burning is  gone on Gabapentin  2/4: NIFs -40   2/7- NIF's stable at -40  2/8- will stop Vital capacity and NIF since been good for 1 week- will restart if Sx's get worse.   2/19 Slow improvement with strength , sensation still absent in UEs 17: Glaucoma: continue latanoprost eye drops   18: Tobacco use: cessation counseling   19. Constipation  1/18- will con't Senna per pt request- to not increase- but if no BM by tomorrow, will try Sorbitol.   2/2 regular BM's per pt 2/3 - large BM  2/6- LBM 2 days ago- if no result by tomorrow, will give Sorbitol  2/9- finally got cleaned out last night after Mylanta-  20.. insomnia  1/20- will start Melatonin 5 mg QHS prn- don't want trazodone since can cause urinary retention  1/22- sleeping a little better 21. Anxiety  1/24- added Buspar 5 mg TID- will also add Celexa for mood/anxiety prevention 20 mg daily. 1/25- no side effects so far.    1/26-27- feels anxiety is do ing MUCH better per pt. 2/1- anxiety still limiting therapy per PT/OT- will increase Buspar to 10 mg TID 2/7- pt says anxiety still limiting- so does therapy- will increase Celexa to 40 mg daily.  22. R knee buckling/pain  2/1- got brace- wasn't real effective/"is thin"- I educated pt another type knee brace would be too heavy for her to lift with her weakness- will call ortho to see if any ideas since hurting MORE overnight- R knee- -ever since fall over weekend on R knee directly.   2/2- Xray just shows severe knee DJD- need for Knee replacement- no fractures- will add Diclofenac 50 mg TID x 5 days to help pain. Since her renal function slightly impaired (not drinking water), will not do longer than 5 days.   2/9- pain has been doing bette rin R knee 23. Hyperkalemia  2/7- K+ down to 5.1- if still elevated tomorrow, then will use Lokelma  2/9- K+ 4.9-   2/9- K+ 5.0- pt doesn't want Loklema since just "got cleaned out".   Recheck tomorrow     2/13- K+ 4.6  2/16- K+ 4.3 2/19 K+ 6.1 - AKI  due to NSAIDs stop diclofenac , fluid bolus 1067m x 1 , lokelma Repeat BMET in am  If BP up and intake ok may ned to resume Lasix at low dose  2/17 K+ down to 5.5 give additional dose lokelma, recheck tomorrow 24. Abd pain  2/9- given mylanta- but had Bms "all night"- so likely was full of stool causing abd pain- eating more than meals from hospital- a lot of snacks from outside.   2/10 improving, had multiple bowel movements this morning  2/11 Additional BM today, improved 25. Dry skin  2/12- added eucerin for dry skin BID  2/14- working better  [place]: [place], 8[place]23 AM

## 2022-11-06 DIAGNOSIS — R600 Localized edema: Secondary | ICD-10-CM

## 2022-11-06 LAB — BASIC METABOLIC PANEL
Anion gap: 9 (ref 5–15)
BUN: 19 mg/dL (ref 6–20)
CO2: 25 mmol/L (ref 22–32)
Calcium: 8.8 mg/dL — ABNORMAL LOW (ref 8.9–10.3)
Chloride: 105 mmol/L (ref 98–111)
Creatinine, Ser: 1.19 mg/dL — ABNORMAL HIGH (ref 0.44–1.00)
GFR, Estimated: 53 mL/min — ABNORMAL LOW (ref >60)
Glucose, Bld: 104 mg/dL — ABNORMAL HIGH (ref 70–99)
Potassium: 4.8 mmol/L (ref 3.5–5.1)
Sodium: 139 mmol/L (ref 135–145)

## 2022-11-06 NOTE — Progress Notes (Signed)
Inpatient Rehabilitation Discharge Medication Review by a Pharmacist  A complete drug regimen review was completed for this patient to identify any potential clinically significant medication issues.  High Risk Drug Classes Is patient taking? Indication by Medication  Antipsychotic Yes Buspar - anxiety  Anticoagulant Yes Apixaban - VTE prophylaxis  Antibiotic No   Opioid Yes Dilaudid prn severe pain  Antiplatelet No   Hypoglycemics/insulin No   Vasoactive Medication Yes Metoprolol tartrate - hypertension  Chemotherapy No   Other Yes Celexa- anxiety Flomax-urinary retention Protonix - acid reflux Senokot - bowel regimen 123456, folic acid, MVI, MagOx - vitamins/supplements Flexeril - pain/muscle spasms Gabapentin - pain Cosopt, Xalatan - glaucoma Eucerin cream - skin care     Type of Medication Issue Identified Description of Issue Recommendation(s)  Drug Interaction(s) (clinically significant)     Duplicate Therapy     Allergy     No Medication Administration End Date     Incorrect Dose     Additional Drug Therapy Needed     Significant med changes from prior encounter (inform family/care partners about these prior to discharge).  Communicate medication changes with patient/family at discharge  Other       Clinically significant medication issues were identified that warrant physician communication and completion of prescribed/recommended actions by midnight of the next day:  No  Name of provider notified for urgent issues identified:    Provider Method of Notification:     Pharmacist comments:    Time spent performing this drug regimen review (minutes): 20   Anette Guarneri, PharmD

## 2022-11-06 NOTE — Progress Notes (Signed)
PROGRESS NOTE   Subjective/Complaints:  K+ down to 4.8, DC home today. She reports her leg swelling is overall improved  ROS:  Pt denies SOB, abd pain, HA,  CP, N/V/C/D, and vision changes    Except for HPI Objective:   No results found. Recent Labs    11/04/22 0554  WBC 7.6  HGB 9.9*  HCT 31.1*  PLT 308        Recent Labs    11/05/22 0529 11/06/22 0525  NA 139 139  K 5.5* 4.8  CL 104 105  CO2 27 25  GLUCOSE 95 104*  BUN 24* 19  CREATININE 1.30* 1.19*  CALCIUM 8.7* 8.8*       Intake/Output Summary (Last 24 hours) at 11/06/2022 1431 Last data filed at 11/06/2022 0750 Gross per 24 hour  Intake 900.05 ml  Output --  Net 900.05 ml         Physical Exam: Vital Signs Blood pressure 132/82, pulse 79, temperature 98.3 F (36.8 C), temperature source Oral, resp. rate 17, height 5' 4"$  (1.626 m), weight 129 kg, last menstrual period 08/17/2012, SpO2 100 %.  General: No acute distress Mood and affect are appropriate, very pleasant Heart: Regular rate and rhythm no rubs murmurs or extra sounds Lungs: Clear to auscultation, breathing unlabored, no rales or wheezes Abdomen: Positive bowel sounds, soft nontender to palpation, nondistended Extremities: No clubbing, cyanosis, or edema Skin: No evidence of breakdown, no evidence of rash MS: much less  TTP L low back- lumbar paraspinals Skin; dry skin on arms/legs- cannot assess buttocks today Neuro: alert and oriented x3, CN 2-12 grossly intact  Prior exams: MS: less TTP in R knee- effusion slightly better; B/ hand shaking- due to weakness sin hands/Ue's.  Neuro: Alert and oriented x 3. Normal insight and awareness. Intact Memory. Normal language and speech. Cranial nerve exam unremarkable. Stocking glove sensory loss BLE L>>R. BUE both hands/wrists. UE strength 4/5 except distal is 4-/5 LE's- HF 2+/5; KE 4-/5; DF 3+ to 4-/5 and PF 4- to 4/5 B/L  Skin:  multiple IV's and blood draw tape areas- MS: R knee- still has mild effusion- TTP diffusely.    Assessment/Plan: 1. Functional deficits which require 3+ hours per day of interdisciplinary therapy in a comprehensive inpatient rehab setting. Physiatrist is providing close team supervision and 24 hour management of active medical problems listed below. Physiatrist and rehab team continue to assess barriers to discharge/monitor patient progress toward functional and medical goals  Care Tool:  Bathing    Body parts bathed by patient: Right arm, Left arm, Chest, Abdomen, Front perineal area, Buttocks, Right upper leg, Left upper leg, Face, Right lower leg, Left lower leg   Body parts bathed by helper: Left lower leg, Right lower leg     Bathing assist Assist Level: Independent with assistive device     Upper Body Dressing/Undressing Upper body dressing   What is the patient wearing?: Pull over shirt    Upper body assist Assist Level: Independent with assistive device    Lower Body Dressing/Undressing Lower body dressing      What is the patient wearing?: Underwear/pull up, Pants     Lower body  assist Assist for lower body dressing: Contact Guard/Touching assist     Toileting Toileting    Toileting assist Assist for toileting: Contact Guard/Touching assist     Transfers Chair/bed transfer  Transfers assist     Chair/bed transfer assist level: Minimal Assistance - Patient > 75%     Locomotion Ambulation   Ambulation assist   Ambulation activity did not occur: Safety/medical concerns  Assist level: Minimal Assistance - Patient > 75% Assistive device: Walker-rolling Max distance: 40 ft   Walk 10 feet activity   Assist  Walk 10 feet activity did not occur: Safety/medical concerns  Assist level: Minimal Assistance - Patient > 75% Assistive device: Walker-rolling   Walk 50 feet activity   Assist Walk 50 feet with 2 turns activity did not occur:  Safety/medical concerns (unable due to R LE pain and fatigue)         Walk 150 feet activity   Assist Walk 150 feet activity did not occur: Safety/medical concerns         Walk 10 feet on uneven surface  activity   Assist Walk 10 feet on uneven surfaces activity did not occur: Safety/medical concerns (unable due to proprioceptive deficits, R LE pain and fatigue)         Wheelchair     Assist Is the patient using a wheelchair?: Yes Type of Wheelchair: Power    Wheelchair assist level: Supervision/Verbal cueing Max wheelchair distance: >150 ft    Wheelchair 50 feet with 2 turns activity    Assist        Assist Level: Supervision/Verbal cueing   Wheelchair 150 feet activity     Assist      Assist Level: Supervision/Verbal cueing   Blood pressure 132/82, pulse 79, temperature 98.3 F (36.8 C), temperature source Oral, resp. rate 17, height 5' 4"$  (1.626 m), weight 129 kg, last menstrual period 08/17/2012, SpO2 100 %.  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 More training with chair so she can get out of tight spaces  D/c 2/21  Con't CIR PT and OT  -DC home  2.  Antithrombotics: -DVT/anticoagulation:  Pharmaceutical: Lovenox start 1/17 2/15- will switch to Eliquis on d/c to 2.5 mg BID for 1 more month.              -antiplatelet therapy: none   3. 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 Changed flexeril to 10 mg TID- not prn  2/13- Back pain gradually improving- did an exercise with therapy that hurt back- was stopped.   2/15- pain worse- will add Diclofenac 50 mg BID for now as well as TrP injections today   4. Anixety: continue celexa             -antipsychotic agents: n/a   1/24- anxiety is limiting therapy- will add Buspar and Celexa for anxiety  1/28 a little frustrated by ongoing deficits  1/30- thinks need SNF due to deficits- frustrated by this  2/1- will increase Buspar  2/7- increased Celexa to 40 mg daily 5. Neuropsych/cognition: This patient is capable of making decisions on her own behalf.   6. Skin/Wound Care: Routine skin care checks   7. Fluids/Electrolytes/Nutrition: Routine Is and Os and follow-up chemistries             -protein supplements for hypoalbuminemia             -continue folic acid, [id number]             -continue MVI   1/18- will check with pharmacy if needs B12 shots AND daily pills.   8: Chronic back pain/radiculopathy:  -continue Dilaudid 4 mg q 6 hours - is her home medicine (PTA per Dr. [name])             -continue gabapentin 600 mg TID             -continue Lidoderm patch daily             -continue Flexeril 10 mg TID prn   1/26- will d/c Lidoderm- not using.  Otherwise, pain controlled- con't regimen  2/16- TrP injections yesterday- back doing SO much better 9: Urinary retention: Foley replaced on 1/15             -treated for UTI with Rocephin for  7 days   1/18- will start Flomax 0.4 mg  qsupper and will try and remove Foley in AM- explained will need in/out caths likely before bladder muscle kicks in and she can void.   1/19- foley out this Am at 6am- hasn't voided yet- added bladder scans q6 hours and then cath of volumes /350cc- pt made aware can take a few days to kick in/the flomax. Has voided some and cathed x1-   1/20- no more caths require-d voiding on her own- con't flomax  1/22- d/c bladder scans since voiding well- only required 1 cath since foley removed fyi.   1/23- voiding well- d/c bladder scans  2/6- resolved? But Cr up to 2.29- and BUN 26-   2/7- off Lasix  2/8- Cr 1.21 and Bun 27- sotpped IVFs  2/12- labs pending  2/13- Cr 1.12 and BUN 16-   2/14- not drinking well- will con't to push water intake.   2/15- Cr down to 1.02 and BUN 20 today- is doing better- push fluids daily.     Latest Ref Rng & Units 11/06/2022    5:25 AM 11/05/2022    5:29 AM 11/04/2022    5:54 AM  BMP  Glucose 70 - 99 mg/dL 104  95  93   BUN 6 - 20 mg/dL 19  24  31   $ Creatinine 0.44 - 1.00 mg/dL 1.19  1.30  1.64   Sodium 135 - 145 mmol/L 139  139  137   Potassium 3.5 - 5.1 mmol/L 4.8  5.5  6.1   Chloride 98 - 111 mmol/L 105  104  102   CO2 22 - 32 mmol/L 25  27  25   $ Calcium 8.9 - 10.3 mg/dL 8.8  8.7  8.8   11/02/22- AKI although intake has improved and pt off , likely NSAID will stop  10: Hypertension: monitor TID and prn             -continue Lasix 40 mg daily             -continue Lopressor 50 mg BID             -add magnesium gluconate 235m HS   1/22- BP controlled- con't regimen  1/29- Lasix 40 mg BID- but home dose 40 mg daily- since Cr 1.26 up from 0.75 last week, will reduce to daily. And recheck Thursday  2/1- new Cr 1.10- and BUN better at 17- from 22- will recheck Monday- sinc eon lower dose of Lasix- no increase in LE edema  2/6- Cr up to 2.29- will recheck today and in AM- says she's drinking- will recheck today and if still elevated, we need IVFs 75cc/hour. If is  still elevated, will also stop Lasix for now.   2/7- down to 1.82- will do IVF's another 24 hours and recheck in AM  2/8- will stop IVFs- off Lasix- no LE edema- needs weight- will order  2/10 elevated, possibly pain related, continue to monitor for now  2/16- BP usually controlled- is 146/61- con't to monitor for trend  2/21 intermittent elevation,continue current medications f/u with PCP    11/06/2022    4:14 AM 11/05/2022    8:38 PM 11/05/2022    4:18 PM  Vitals with BMI  Systolic [id number] Diastolic 82 77 74  Pulse 79 77 77     11: GERD?/GI prophylaxis: continue Protonix   12: B12 deficiency: continue supplementation   1/19- says itches with B12 shots, but just takes benadryl 13: Class 3 obesity: BMI = 48; weight loss counseling   1/24- BMI down to 46.62  2/1- BMI 47.15  2/7- pt eating a lot of junk food per staff- 1 lb M&M's eaten in 2 days; having a lot of fast food brought by husband 14: Hyponatremia, resolved     15: Elevated serum creatinine/AKI; 0.75 1/16 to 1.06 on 1/17             -follow-up BMP   1/18- stable- con't to monitor weekly and prn  1/29- Cr 1.16- from 0.75- will reduce Lasix  2/1- labs today- Cr 1.10- and no LE edema on exam  2/2- not drinking- per pt- hates water- encouraged Mio/to add flavor to water and to drink 6-8 cups/day.   2/7- Cr 1.82 down from 2.29 and BUN 37- off Lasix as of this AM- will give another 24 hours of IVFs at 75cc/hour and recheck in AM  2/8- stopped IVFs since Cr down to 1.20 and BUN down to 27- wil recheck in AM to make sure stays down. 2/9- Cr 0.99 and BUN 18- no IVFs in last 24 hours- so happy with results- encouraged pt to keep drinking water. Recheck Monday  2/13- Cr 1.12- is inching back up- BUN stable- will recheck Thursday since is back up some- since 1.12, no intervention right this moment- encourage pt to drink still.  2/15- Cr 1.02- doing better 2/20, IVF tonight NS, recheck tomorrow  2/21 CR improved to 1.19, continue  to hold lasix for now, f/u with PCP 16: GBS: continue to monitor NIFs/VIC q 12 hours             -continue gabapentin   1/19- will increase gabapaentin to 900 mg TID  1/23- slightly better- con't regimen  2/1- tingling/burning is gone on Gabapentin  2/4: NIFs -40   2/7- NIF's stable at -40  2/8- will stop Vital capacity and NIF since been good for 1 week- will restart if Sx's get worse.   2/19 Slow improvement with strength , sensation still absent in UEs 17: Glaucoma: continue latanoprost eye drops   18: Tobacco use: cessation counseling   19. Constipation  1/18- will con't Senna per pt request- to not increase- but if no BM by tomorrow, will try Sorbitol.   2/2 regular BM's per pt 2/3 - large BM  2/6- LBM 2 days ago- if no result by tomorrow, will give Sorbitol  2/9- finally got cleaned out last night after Mylanta-  20.. insomnia  1/20- will start Melatonin 5 mg QHS prn- don't want trazodone since can cause urinary retention  1/22- sleeping a little better 21. Anxiety  1/24- added Buspar 5 mg TID- will also add Celexa for mood/anxiety prevention 20 mg daily. 1/25- no side effects so far.    1/26-27- feels anxiety is do ing MUCH better per pt. 2/1- anxiety still limiting therapy per PT/OT- will increase Buspar to 10 mg TID 2/7- pt says anxiety still limiting- so does therapy- will increase Celexa to 40 mg daily.  22. R knee buckling/pain  2/1- got brace- wasn't real effective/"is thin"- I educated pt another type knee brace would be too heavy for her to lift with her weakness- will call ortho to see if any ideas since hurting MORE overnight- R knee- -ever since fall over weekend on R knee directly.   2/2- Xray just shows severe knee DJD- need for Knee replacement- no fractures- will add Diclofenac 50 mg TID x 5 days to help pain. Since her renal function slightly impaired (not drinking water), will not do longer than 5 days.   2/9- pain has been doing bette rin R knee 23.  Hyperkalemia  2/7- K+ down to 5.1- if still elevated tomorrow, then will use Lokelma  2/9- K+ 4.9-   2/9- K+ 5.0- pt doesn't want Loklema since just "got cleaned out".   Recheck tomorrow     2/13- K+ 4.6  2/16- K+ 4.3 2/19 K+ 6.1 - AKI due to NSAIDs stop diclofenac , fluid bolus 1044m x 1 , lokelma Repeat BMET in am  If BP up and intake ok may ned to resume Lasix at low dose  2/17 K+ down to 5.5 give additional dose lokelma, recheck tomorrow  K+ down to 4.8 f/u with PCP for recheck 24. Abd pain  2/9- given mylanta- but had Bms "all night"- so likely was full of stool causing abd pain- eating more than meals from hospital- a lot of snacks from outside.   2/10 improving, had multiple bowel movements this morning  2/11 Additional BM today, improved 25. Dry skin  2/12- added eucerin for dry skin BID  2/14- working better  L[place]: [place]24, 2:31 PM

## 2022-11-06 NOTE — Progress Notes (Signed)
Closed out nursing care plan and nursing education for discharge to home.

## 2022-11-06 NOTE — Progress Notes (Signed)
Inpatient Rehabilitation Care Coordinator Discharge Note   Patient Details  Name: Dana [name] MRN: [id number] Date of Birth: 1966/07/27   Discharge location: [place] AWARE WILL NEED 24/7 CARE  Length of Stay: 35 DAYS  Discharge activity level: CGA-MIN LEVEL  Home/community participation: ACTIVE  Patient response [id number] Literacy - How often do you need to have someone help you when you read instructions, pamphlets, or other written material from your doctor or pharmacy?: Never  Patient response [id number] Isolation - How often do you feel lonely or isolated from those around you?: Never  Services provided included: MD, RD, PT, OT, RN, CM, TR, Pharmacy, Neuropsych, SW  Financial Services:  Charity fundraiser Utilized: Regulatory affairs officer  Choices offered to/list presented to: PT AND HUSBAND  Follow-up services arranged:  Home Health, DME, Patient/Family request agency HH/DME Home Health Agency: [place]    DME : [facility] BED, WIDE DROP-ARM BEDSIDE COMMODE AND TUB BENCH  STALLS-POWER CHAIR HH/DME Requested Agency: REQUESTED [facility] PRIVATE DUTY LIST GIVEN TO HUSBAND TO PURSUE IF NEEDED. BROUGHT STEDY ON OWN AND HAS AT HOME  Patient response to transportation need: Is the patient able to respond to transportation needs?: Yes In the past 12 months, has lack of transportation kept you from medical appointments or from getting medications?: No In the past 12 months, has lack of transportation kept you from meetings, work, or from getting things needed for daily living?: No    Comments (or additional information):HUSBAND WAS IN FOR HANDS ON FAMILY EDUCATION AND AWARE OF [place] 24/7 CARE. HE WAS TO TALK WITH FOUR CHILDREN REGARDING ASSISTING AND ASKED FOR THEM TO COME IN FOR EDUCATION WHICH THEY DID NOT. PURSUED NHP BUT NO PLACE WAS ACCEPTABLE TO HUSBAND THAT OFFERED A BED AND DECISION WITH HER PROGRESS  WAS TO TAKE HER HOME. HUSBAND TO TAKE OFF FIRST WEEK AND GO FROM THERE.  CONCERN DROP-ARM BEDSIDE COMMODE WILL NOT BE AT HOME WHEN PT ARRIVES OR NEEDS TO USE THE RESTROOM-ASKED PT IF SHOULD DELAY THE AMBULANCE. SHE DID NOT WANT TO DO THIS AND SAID COULD USE [facility] IF HAD TOO SINCE HAS AT HOME FROM MOTHER [place]. [facility] COULD NOT GIVE THIS WORKER ETA ON DELIVERY OF COMMODE  Patient/Family verbalized understanding of follow-up arrangements:  Yes  Individual responsible for coordination of the follow-up plan: SELF AND [facility] [id number]  Confirmed correct DME delivered: Elease Hashimoto 11/06/2022    Shawndale Kilpatrick, Gard[place] Rhyme

## 2022-11-07 ENCOUNTER — Telehealth: Payer: Self-pay

## 2022-11-07 NOTE — Transitions of Care (Post Inpatient/ED Visit) (Signed)
   11/07/2022  Name: Dana [name] MRN: [id number] DOB: 10/30/1965  Today's TOC FU Call Status: Today's TOC FU Call Status:: Unsuccessul Call (1st Attempt) Unsuccessful Call (1st Attempt) Date: 11/07/22  Attempted to reach the patient regarding the most recent Inpatient/ED visit.  Follow Up Plan: Additional outreach attempts will be made to reach the patient to complete the Transitions of Care (Post Inpatient/ED visit) call.   [place] LPN [place] Advisor Direct Dial [phone]

## 2022-11-11 ENCOUNTER — Telehealth: Payer: Self-pay | Admitting: Family Medicine

## 2022-11-11 NOTE — Telephone Encounter (Signed)
Says someone from here by the name of Beth called her and she ws returning the call

## 2022-11-11 NOTE — Transitions of Care (Post Inpatient/ED Visit) (Unsigned)
   11/11/2022  Name: Dana [name] MRN: [id number] DOB: 1966/07/15  Today's TOC FU Call Status: Today's TOC FU Call Status:: Unsuccessful Call (2nd Attempt) Unsuccessful Call (1st Attempt) Date: 11/07/22 Unsuccessful Call (2nd Attempt) Date: 11/11/22  Attempted to reach the patient regarding the most recent Inpatient/ED visit.  Follow Up Plan: Additional outreach attempts will be made to reach the patient to complete the Transitions of Care (Post Inpatient/ED visit) call.   [place] LPN [place] Advisor Direct Dial [phone]

## 2022-11-12 NOTE — Transitions of Care (Post Inpatient/ED Visit) (Signed)
   11/12/2022  Name: Dana [name] MRN: [id number] DOB: 10-05-1965  Today's TOC FU Call Status: Today's TOC FU Call Status:: Unsuccessful Call (3rd Attempt) Unsuccessful Call (1st Attempt) Date: 11/07/22 Unsuccessful Call (2nd Attempt) Date: 11/11/22 Unsuccessful Call (3rd Attempt) Date: 11/12/22  Attempted to reach the patient regarding the most recent Inpatient/ED visit.  Follow Up Plan: No further outreach attempts will be made at this time. We have been unable to contact the patient.   Pt has fu appt with PCP 11-15-22 [place] Direct Dial [phone]

## 2022-11-13 ENCOUNTER — Telehealth: Payer: Self-pay | Admitting: *Deleted

## 2022-11-13 NOTE — Telephone Encounter (Signed)
[facility] called and reports that Dana [name]'s BP was 140/98 (required to report) but that she had just had a scare before his visit when she accidentally ran her power w/c into her cupboard in the kitchen and the anxiety probably elevated her BP per patient. She follows up with [place] 12/30/22 but she is seeing her PCP 11/15/22.

## 2022-11-14 ENCOUNTER — Telehealth: Payer: Self-pay

## 2022-11-14 NOTE — Telephone Encounter (Signed)
Verbal orders for SW  given to [place] @ [facility] per discharge summary

## 2022-11-15 ENCOUNTER — Inpatient Hospital Stay: Payer: 59 | Admitting: Family Medicine

## 2022-11-22 NOTE — Telephone Encounter (Signed)
We don't have an employee by name Beth

## 2022-11-27 ENCOUNTER — Encounter: Payer: Self-pay | Admitting: Family Medicine

## 2022-11-27 ENCOUNTER — Telehealth (INDEPENDENT_AMBULATORY_CARE_PROVIDER_SITE_OTHER): Payer: 59 | Admitting: Family Medicine

## 2022-11-27 DIAGNOSIS — G8929 Other chronic pain: Secondary | ICD-10-CM | POA: Diagnosis not present

## 2022-11-27 DIAGNOSIS — F1190 Opioid use, unspecified, uncomplicated: Secondary | ICD-10-CM

## 2022-11-27 DIAGNOSIS — M5440 Lumbago with sciatica, unspecified side: Secondary | ICD-10-CM | POA: Diagnosis not present

## 2022-11-27 MED ORDER — HYDROMORPHONE HCL 4 MG PO TABS: 4.0000 mg | ORAL_TABLET | Freq: Four times a day (QID) | ORAL | 0 refills | Status: DC | PRN

## 2022-11-27 NOTE — Progress Notes (Signed)
Subjective:    Patient ID: Dana [name], female    DOB: 03/03/1966, 57 y.o.   MRN: [id number]  HPI Virtual Visit via Video Note  I connected with the patient on 11/27/22 at 10:15 AM EDT by a video enabled telemedicine application and verified that I am speaking with the correct person using two identifiers.  Location patient: home Location provider:work or home office Persons participating in the virtual visit: patient, provider  I discussed the limitations of evaluation and management by telemedicine and the availability of in person appointments. The patient expressed understanding and agreed to proceed.   HPI: Here for a pain management visit. Her back pain has been stable, but she has had a rough few months. She is recovering from Guillian-Barre syndrome, and she got home from the hospital on 11-06-22. She is getting stronger, but she can only walk a few steps, even with using a walker.    ROS: See pertinent positives and negatives per HPI.  Past Medical History:  Diagnosis Date   Anxiety    Back pain with radiation    Breast mass, right    Chronic pain    Chronic, continuous use of opioids    Complication of anesthesia 04/07/14   Allergic reaction to Lisinopril immediately following surgery   DJD (degenerative joint disease)    Fibromyalgia    Groin abscess    Headache(784.0)    History of IBS    Hypercholesterolemia    IBS (irritable bowel syndrome)    Lactose intolerance    Mild hypertension    Obesity    Tobacco use disorder    Umbilical hernia    Symptomatic    Past Surgical History:  Procedure Laterality Date   ABDOMINAL HYSTERECTOMY     ANTERIOR CERVICAL DECOMP/DISCECTOMY FUSION N/A 12/20/2015   Procedure: ANTERIOR CERVICAL DECOMPRESSION FUSION CERVICAL 4-5, CERVICAL 5-6, CERVICAL 6-7 WITH INSTRUMENTATION AND ALLOGRAFT;  Surgeon: Phylliss Bob, MD;  Location: [facility];  Service: Orthopedics;  Laterality: N/A;  Anterior cervical decompression fusion, cervical  4-5, cervical 5-6, cervical 6-7 with instrumentation and allograft   BACK SURGERY     BILATERAL SALPINGECTOMY  09/03/2012   Procedure: BILATERAL SALPINGECTOMY;  Surgeon: [name], MD;  Location: [facility];  Service: Gynecology;  Laterality: Bilateral;   BREAST BIOPSY Right 04/07/2014   Procedure: REMOVAL RIGHT BREAST MASS WITH WIRE LOCALIZATION;  Surgeon: [name], MD;  Location: [facility];  Service: General;  Laterality: Right;   BREAST EXCISIONAL BIOPSY Left    x2   BREAST LUMPECTOMY     x2   CARPAL TUNNEL RELEASE Left 05/11/2019   Procedure: LEFT CARPAL TUNNEL RELEASE, RIGHT TENNIS ELBOW MARCAINE/DEPO MEDROL INJECTION UNDER ANESTHESIA;  Surgeon: Jessy Oto, MD;  Location: [facility];  Service: Orthopedics;  Laterality: Left;   COLONOSCOPY W/ BIOPSIES  04/24/2012   per Dr. Carlean Purl, clear, repeat in 10 yrs    disectomy     ESOPHAGOGASTRODUODENOSCOPY     FINGER SURGERY     Right index-excision of mass    FOOT SURGERY Right    Bone Spurs   LAPAROSCOPIC HYSTERECTOMY  09/03/2012   Procedure: HYSTERECTOMY TOTAL LAPAROSCOPIC;  Surgeon: [name], MD;  Location: [facility];  Service: Gynecology;  Laterality: N/A;   LUMBAR DISC SURGERY     TUBAL LIGATION     UMBILICAL HERNIA REPAIR N/A 05/01/2018   Procedure: UMBILICAL HERNIA REPAIR;  Surgeon: Judeth Horn, MD;  Location: [facility];  Service: General;  Laterality: N/A;  Family History  Problem Relation Age of Onset   Diabetes Father    Diabetes Mother    Prostate cancer Paternal Grandfather    Breast cancer Maternal Aunt 17     Current Outpatient Medications:    apixaban (ELIQUIS) 2.5 MG TABS tablet, Take 1 tablet (2.5 mg total) by mouth 2 (two) times daily., Disp: 60 tablet, Rfl: 0   busPIRone (BUSPAR) 10 MG tablet, Take 1 tablet (10 mg total) by mouth 3 (three) times daily., Disp: 90 tablet, Rfl: 0   citalopram (CELEXA) 40 MG tablet, Take 1 tablet (40 mg total) by mouth daily., Disp: 30 tablet, Rfl: 0   cyanocobalamin  (VITAMIN B12) 1000 MCG/ML injection, Inject 1 mL (1,000 mcg total) into the muscle once a week. (Patient taking differently: Inject 1,000 mcg into the muscle once a week. Thursday), Disp: 10 mL, Rfl: 11   cyclobenzaprine (FLEXERIL) 10 MG tablet, Take 1 tablet (10 mg total) by mouth 3 (three) times daily., Disp: 90 tablet, Rfl: 0   dorzolamide-timolol (COSOPT) 2-0.5 % ophthalmic solution, 1 drop 2 (two) times daily., Disp: , Rfl:    fluticasone (FLONASE) 50 MCG/ACT nasal spray, SPRAY 2 SPRAYS INTO EACH NOSTRIL EVERY DAY (Patient taking differently: Place 2 sprays into both nostrils daily as needed for allergies.), Disp: 16 mL, Rfl: 11   folic acid (FOLVITE) 1 MG tablet, Take 1 tablet (1 mg total) by mouth daily., Disp: 90 tablet, Rfl: 3   gabapentin (NEURONTIN) 300 MG capsule, Take 3 capsules (900 mg total) by mouth 3 (three) times daily., Disp: 90 capsule, Rfl: 0   hydrocerin (EUCERIN) CREA, Apply 1 Application topically 2 (two) times daily., Disp: , Rfl: 0   HYDROmorphone (DILAUDID) 4 MG tablet, Take 1 tablet (4 mg total) by mouth every 6 (six) hours as needed for severe pain., Disp: 120 tablet, Rfl: 0   latanoprost (XALATAN) 0.005 % ophthalmic solution, 1 drop at bedtime., Disp: , Rfl:    magnesium oxide (MAG-OX) 400 MG tablet, Take 0.5 tablets (200 mg total) by mouth at bedtime., Disp: 15 tablet, Rfl: 0   metoprolol tartrate (LOPRESSOR) 50 MG tablet, Take 1 tablet (50 mg total) by mouth 2 (two) times daily., Disp: 60 tablet, Rfl: 0   Multiple Vitamin (MULTIVITAMIN WITH MINERALS) TABS tablet, Take 1 tablet by mouth daily., Disp: 30 tablet, Rfl: 0   pantoprazole (PROTONIX) 40 MG tablet, Take 1 tablet (40 mg total) by mouth daily., Disp: 30 tablet, Rfl: 0   senna-docusate (SENOKOT-S) 8.6-50 MG tablet, Take 1 tablet by mouth 2 (two) times daily., Disp: , Rfl:    tamsulosin (FLOMAX) 0.4 MG CAPS capsule, Take 1 capsule (0.4 mg total) by mouth daily after supper., Disp: 30 capsule, Rfl:  0  EXAM:  VITALS per patient if applicable:  GENERAL: alert, oriented, appears well and in no acute distress  HEENT: atraumatic, conjunttiva clear, no obvious abnormalities on inspection of external nose and ears  NECK: normal movements of the head and neck  LUNGS: on inspection no signs of respiratory distress, breathing rate appears normal, no obvious gross SOB, gasping or wheezing  CV: no obvious cyanosis  MS: moves all visible extremities without noticeable abnormality  PSYCH/NEURO: pleasant and cooperative, no obvious depression or anxiety, speech and thought processing grossly intact  ASSESSMENT AND PLAN: Pain management. Indication for chronic opioid: low back pain Medication and dose: Hydromorphone 5 mg # pills per month: 120 Last UDS date: 09-19-21 Opioid Treatment Agreement signed (Y/N): 12-19-17 Opioid Treatment Agreement last reviewed  with patient:  11-27-22 [facility] reviewed this encounter (include red flags): Yes We refilled the medication for one month only. She is past due for a UDS, so she will provide a urine sample sometime soon.  [name], MD  Discussed the following assessment and plan:  No diagnosis found.     I discussed the assessment and treatment plan with the patient. The patient was provided an opportunity to ask questions and all were answered. The patient agreed with the plan and demonstrated an understanding of the instructions.   The patient was advised to call back or seek an in-person evaluation if the symptoms worsen or if the condition fails to improve as anticipated.      Review of Systems     Objective:   Physical Exam        Assessment & Plan:

## 2022-12-05 ENCOUNTER — Ambulatory Visit (INDEPENDENT_AMBULATORY_CARE_PROVIDER_SITE_OTHER): Payer: 59 | Admitting: Family Medicine

## 2022-12-05 ENCOUNTER — Encounter: Payer: Self-pay | Admitting: Family Medicine

## 2022-12-05 VITALS — BP 128/80 | HR 68 | Temp 98.0°F

## 2022-12-05 DIAGNOSIS — N393 Stress incontinence (female) (male): Secondary | ICD-10-CM | POA: Diagnosis not present

## 2022-12-05 DIAGNOSIS — G610 Guillain-Barre syndrome: Secondary | ICD-10-CM

## 2022-12-05 DIAGNOSIS — N3000 Acute cystitis without hematuria: Secondary | ICD-10-CM | POA: Diagnosis not present

## 2022-12-05 MED ORDER — METOPROLOL TARTRATE 50 MG PO TABS: 50.0000 mg | ORAL_TABLET | Freq: Two times a day (BID) | ORAL | 3 refills | Status: DC

## 2022-12-05 MED ORDER — PANTOPRAZOLE SODIUM 40 MG PO TBEC: 40.0000 mg | DELAYED_RELEASE_TABLET | Freq: Every day | ORAL | 3 refills | Status: DC

## 2022-12-05 MED ORDER — ALPRAZOLAM 0.5 MG PO TABS: 0.5000 mg | ORAL_TABLET | Freq: Two times a day (BID) | ORAL | 1 refills | Status: DC | PRN

## 2022-12-05 MED ORDER — MAGNESIUM OXIDE 400 MG PO TABS: 400.0000 mg | ORAL_TABLET | Freq: Every day | ORAL | 3 refills | Status: AC

## 2022-12-05 MED ORDER — FUROSEMIDE 40 MG PO TABS: 40.0000 mg | ORAL_TABLET | Freq: Two times a day (BID) | ORAL | 3 refills | Status: DC

## 2022-12-05 NOTE — Progress Notes (Signed)
   Subjective:    Patient ID: Dana [name], female    DOB: [birth year], 57 y.o.   MRN: [id number]  HPI Here with her husband for a transitional care visit to follow up a hospital stay from 10-02-22 to 11-06-22 for Guillain-Barre syndrome. She had been experiencing weakness in the legs, and when this got to the point she could not walk she went to the ED. Once the diagnosis was made, she was given IVIG for 5 days. She had bladder atony so a Foley catheter was placed and she was started on Flomax. She was given PT, OT, and ST every day for 3 hours a day. She began improving and getting some strength back. She was found to  have a UTI with E coli and Klebsiella pneumoniae, and she was treated with 7 days of Rocephin. The Foley was then removed, and she was able to urinate normally. She was started on 30 days of Eliquis to prevent DVT's and Pe's, and this will be over in another week. She was taken off Xanax for some reason, and was started on Buspar and Celexa. However these make her very sedated and irritable. Unfortunately due to insurance lapses she has not been getting any therapy since she went home. She is able to walk short distances now but she tires very quickly.    Review of Systems  Constitutional: Negative.   Respiratory: Negative.    Cardiovascular: Negative.   Gastrointestinal: Negative.   Genitourinary: Negative.   Neurological:  Positive for weakness. Negative for numbness.  Psychiatric/Behavioral:  Positive for agitation. Negative for confusion, dysphoric mood, hallucinations and sleep disturbance. The patient is nervous/anxious.        Objective:   Physical Exam Constitutional:      Comments: In a motorized wheelchair   Cardiovascular:     Rate and Rhythm: Normal rate and regular rhythm.     Pulses: Normal pulses.     Heart sounds: Normal heart sounds.  Pulmonary:     Effort: Pulmonary effort is normal.     Breath sounds: Normal breath sounds.  Abdominal:     General:  Abdomen is flat. Bowel sounds are normal. There is no distension.     Palpations: Abdomen is soft. There is no mass.     Tenderness: There is no abdominal tenderness. There is no guarding or rebound.     Hernia: No hernia is present.  Musculoskeletal:     Comments: 2+ edema in both ankles   Neurological:     Mental Status: She is alert and oriented to person, place, and time.           Assessment & Plan:  She is recovering from Guillain-Barre syndrome, and she definitely needs more therapy. She is able to get transportation with her family to an outpatient therapy facility, so we will order PT and OT. She has recovered from a UTI. She is urinating freely so we will stop the Flomax. For her anxiety, we will stop the Buspar and the Celexa, and she will get back on Xanax 0.5 mg BID. She will take the last week of Eliquis and then stop it. She is scheduled to follow up with Dr. Adrian Saran with [facility] on 12-30-22. She is scheduled to follow up with Dr. Marcial Pacas at Neurology on 01-28-23.  [name], MD

## 2022-12-09 NOTE — Therapy (Signed)
OUTPATIENT OCCUPATIONAL THERAPY NEURO EVALUATION  Patient Name: Dana [name] MRN: [id number] DOB:25-Dec-1965, 57 y.o., female Today's Date: 12/12/2022  PCP: Dr. Sarajane Jews REFERRING PROVIDER: Dr. [name]  END OF SESSION:  OT End of Session - 12/12/22 0949     Visit Number 1    Number of Visits 25    Date for OT Re-Evaluation 03/06/23    Authorization Type UHC, Medicare    Progress Note Due on Visit 10    OT Start Time 0845    OT Stop Time 0930    OT Time Calculation (min) 45 min             Past Medical History:  Diagnosis Date   Anxiety    Back pain with radiation    Breast mass, right    Chronic pain    Chronic, continuous use of opioids    Complication of anesthesia 04/07/14   Allergic reaction to Lisinopril immediately following surgery   DJD (degenerative joint disease)    Fibromyalgia    Groin abscess    Headache(784.0)    History of IBS    Hypercholesterolemia    IBS (irritable bowel syndrome)    Lactose intolerance    Mild hypertension    Obesity    Tobacco use disorder    Umbilical hernia    Symptomatic   Past Surgical History:  Procedure Laterality Date   ABDOMINAL HYSTERECTOMY     ANTERIOR CERVICAL DECOMP/DISCECTOMY FUSION N/A 12/20/2015   Procedure: ANTERIOR CERVICAL DECOMPRESSION FUSION CERVICAL 4-5, CERVICAL 5-6, CERVICAL 6-7 WITH INSTRUMENTATION AND ALLOGRAFT;  Surgeon: Phylliss Bob, MD;  Location: [facility];  Service: Orthopedics;  Laterality: N/A;  Anterior cervical decompression fusion, cervical 4-5, cervical 5-6, cervical 6-7 with instrumentation and allograft   BACK SURGERY     BILATERAL SALPINGECTOMY  09/03/2012   Procedure: BILATERAL SALPINGECTOMY;  Surgeon: [name], MD;  Location: [facility];  Service: Gynecology;  Laterality: Bilateral;   BREAST BIOPSY Right 04/07/2014   Procedure: REMOVAL RIGHT BREAST MASS WITH WIRE LOCALIZATION;  Surgeon: [name], MD;  Location: [facility];  Service: General;  Laterality: Right;   BREAST  EXCISIONAL BIOPSY Left    x2   BREAST LUMPECTOMY     x2   CARPAL TUNNEL RELEASE Left 05/11/2019   Procedure: LEFT CARPAL TUNNEL RELEASE, RIGHT TENNIS ELBOW MARCAINE/DEPO MEDROL INJECTION UNDER ANESTHESIA;  Surgeon: Jessy Oto, MD;  Location: [facility];  Service: Orthopedics;  Laterality: Left;   COLONOSCOPY W/ BIOPSIES  04/24/2012   per Dr. Carlean Purl, clear, repeat in 10 yrs    disectomy     ESOPHAGOGASTRODUODENOSCOPY     FINGER SURGERY     Right index-excision of mass    FOOT SURGERY Right    Bone Spurs   LAPAROSCOPIC HYSTERECTOMY  09/03/2012   Procedure: HYSTERECTOMY TOTAL LAPAROSCOPIC;  Surgeon: [name], MD;  Location: [facility];  Service: Gynecology;  Laterality: N/A;   LUMBAR DISC SURGERY     TUBAL LIGATION     UMBILICAL HERNIA REPAIR N/A 05/01/2018   Procedure: UMBILICAL HERNIA REPAIR;  Surgeon: Judeth Horn, MD;  Location: [facility];  Service: General;  Laterality: N/A;   Patient Active Problem List   Diagnosis Date Noted   Guillain Barr syndrome ([place]) 10/01/2022   UTI (urinary tract infection) 09/24/2022   Hypomagnesemia 09/24/2022   Debility 09/23/2022   Hypokalemia 09/23/2022   B12 deficiency 09/05/2022   Folate deficiency 09/05/2022   Carpal tunnel syndrome, left upper limb 05/11/2019  Class: Chronic   Spinal stenosis of lumbar region with neurogenic claudication 08/20/2018   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   Primary osteoarthritis of right knee 02/27/2017   Right tennis elbow 11/11/2016   Muscle cramps 01/15/2016   Bilateral leg edema 01/08/2016   Radiculopathy 12/20/2015   Hyperglycemia 12/21/2014   Mastodynia, female 11/30/2014   S/P excision of fibroadenoma of breast 11/30/2014   Angioedema of lips 04/07/2014   Fibroadenoma of right breast 03/07/2014   Pelvic pain in female 06/19/2012   Menorrhagia 06/11/2012   SUI (stress urinary incontinence, female) 06/11/2012   HTN (hypertension) 06/11/2012   IBS (irritable bowel syndrome) - diarrhea  predominant 03/17/2012   MICROSCOPIC HEMATURIA 11/30/2010   BREAST PAIN, RIGHT 11/30/2010   BREAST MASS, RIGHT 01/12/2010   HYPERCHOLESTEROLEMIA 12/07/2007   CIGARETTE SMOKER 12/07/2007   DEGENERATIVE JOINT DISEASE 12/07/2007   Low back pain with sciatica 12/07/2007   HEADACHE 12/07/2007   Obesity 08/03/2007   Anxiety state 08/03/2007    ONSET DATE: 12/05/22- referral date  REFERRING DIAG: G61.0 (ICD-10-CM) - Guillain Barr syndrome ([place])  THERAPY DIAG:    Rationale for Evaluation and Treatment: Rehabilitation  SUBJECTIVE:   SUBJECTIVE STATEMENT: Pt reports weakness and numbness in hands Pt accompanied by: husband  Will   PERTINENT HISTORY: Pt is a 57 y/o female hospitalized 09/23/22  with progressive weakness and sensation changes, consistent with Guillain-Barr syndrome. MRI negative. LP results and assessment most consistent with Guillain-Barr syndrome.  Pt was d/c home from CIR 11/06/22 PMH includes: glaucoma anxiety, DJD, fibromyalgia, HTN, tobacco use, B-12 deficiency, OA, ACDF 2017. Home health OT/PT wrapped up last week.  PRECAUTIONS: Fall  WEIGHT BEARING RESTRICTIONS: no  PAIN: no  FALLS: Has patient fallen in last 6 months? No  LIVING ENVIRONMENT: Lives with: lives with their spouse Lives in: House/apartment Stairs: has ramp built Has following equipment at home:   BSC, steadi,  tub bench but can't access bathroom   PLOF: Independent  PATIENT GOALS: increase independence   OBJECTIVE:   HAND DOMINANCE: Right  ADLs: Overall ADLs: assistance required, unable to access BR Transfers/ambulation related to ADLs: Eating: unable to cut food Grooming: set up UB Dressing: set up for bra and shirt LB Dressing: max A Toileting: uses BSC uses steadi Bathing: min-mod A in hospital  bed  Tub Shower transfers: n/a Equipment: Radio broadcast assistant  IADLs:dependent for IADLS   MOBILITY STATUS:  uses w/c  , per PT report minguard for transfer from w/c to  mat    ACTIVITY TOLERANCE: Activity tolerance: Pt fatigues quickly    UPPER EXTREMITY ROM:    Active ROM Right eval Left eval  Shoulder flexion 120 100  Shoulder abduction 90 90  Shoulder adduction    Shoulder extension    Shoulder internal rotation    Shoulder external rotation    Elbow flexion    Elbow extension  -25  Wrist flexion    Wrist extension    Wrist ulnar deviation    Wrist radial deviation    Wrist pronation    Wrist supination    (Blank rows = not tested)  UPPER EXTREMITY MMT:     MMT Right eval Left eval  Shoulder flexion 3+/5 3-/5  Shoulder abduction    Shoulder adduction    Shoulder extension    Shoulder internal rotation    Shoulder external rotation    Middle trapezius    Lower trapezius    Elbow flexion 3+/5 3/5  Elbow extension 3/5 3/5  Wrist flexion    Wrist extension    Wrist ulnar deviation    Wrist radial deviation    Wrist pronation    Wrist supination    (Blank rows = not tested)  HAND FUNCTION: Grip strength: Right: 32 lbs; Left: 18 lbs  COORDINATION: 9 Hole Peg test: Right: 1 min 48 sec; Left: 4 pegs in 2 mins  Box and Blocks:  Right 35 blocks, Left  26 blocks  SENSATION: Light touch: WFL Hot/Cold: Not tested    COGNITION: Overall cognitive status:NT   VISION: Subjective report: reports hx of glaucoma VISION ASSESSMENT: Not tested  OBSERVATIONS: Pt is eager to improve, pt's husband is very supportive   TODAY'S TREATMENT:                                                                                                                              DATE: n/a   PATIENT EDUCATION: Education details: role of OT, potential OT goals Person educated: Patient and Spouse Education method: Explanation Education comprehension: verbalized understanding  HOME EXERCISE PROGRAM: N/A   GOALS: Goals reviewed with patient? Yes  SHORT TERM GOALS: Target date: 01/11/23  I with initial HEP Baseline: Goal status:  INITIAL  2.  Pt will demonstrate improved UE functional use as evidenced increasing bilateral box/ blocks score by 4 blocks.  Baseline: R 35, L 26 Goal status: INITIAL  3.  Pt will donn pants with mod A. Baseline: max A Goal status: INITIAL  4. Pt will verbalize understanding of adapted strategies to maximize safety and I with ADLs/ IADLs .   Goal status: INITIAL  5.  Pt will demonstrate ability to stand for functional activity with min A x 10 mins in prep for ADLs.  Goal status: INITIAL  6.  Pt will perform w/c to [facility] transfers mod I  Goal status: INITIAL  LONG TERM GOALS: Target date: 03/06/23  I with updated HEP  Goal status: INITIAL  2.  Pt will perform LB dressing mod I. Baseline:max A Goal status: INITIAL  3.  Pt will bathe with supervision/ set up. Baseline: min-mod A for sponge bath Goal status: INITIAL  4.  Pt will perform basic cooking at a w/c level mod I  Goal status: INITIAL  5.  Pt will demonstrate improved fine motor coordination as evidenced by performing LUE 9 hole peg test in 2 mins or less.   Goal status: INITIAL  6.  Pt will demonstrate improved fine motor coordination as evidenced by performing RUE 9 hole peg test in 1 mins  30 secs or less.  Baseline: RUE  1 min 48 secs, LUE 4 pegs placed in 2 mins  Goal status: INITIAL  ASSESSMENT:  CLINICAL IMPRESSION: Patient is a 57 y.o female who was seen today for occupational therapy evaluation for Guillain-Barre syndrome.. Pt was hospitalized 09/23/22 with progressive weakness and sensation changes, consistent with Guillain-Barr syndrome. MRI negative. LP results and assessment most consistent with  Guillain-Barr syndrome.  Pt was d/c home from CIR 11/06/22 PMH includes: glaucoma anxiety, DJD, fibromyalgia, HTN, tobacco use, B-12 deficiency, OA, ACDF   PERFORMANCE DEFICITS: in functional skills including ADLs, IADLs, coordination, dexterity, sensation, ROM, strength, flexibility, Fine motor control,  Gross motor control, mobility, balance, endurance, decreased knowledge of precautions, decreased knowledge of use of DME, and UE functional use, cognitive skills including  and psychosocial skills including coping strategies, environmental adaptation, habits, interpersonal interactions, and routines and behaviors.   IMPAIRMENTS: are limiting patient from ADLs, IADLs, rest and sleep, play, leisure, and social participation.   CO-MORBIDITIES: may have co-morbidities  that affects occupational performance. Patient will benefit from skilled OT to address above impairments and improve overall function.  MODIFICATION OR ASSISTANCE TO COMPLETE EVALUATION: No modification of tasks or assist necessary to complete an evaluation.  OT OCCUPATIONAL PROFILE AND HISTORY: Detailed assessment: Review of records and additional review of physical, cognitive, psychosocial history related to current functional performance.  CLINICAL DECISION MAKING: LOW - limited treatment options, no task modification necessary  REHAB POTENTIAL: Good  EVALUATION COMPLEXITY: Low    PLAN:  OT FREQUENCY: 2x/week  OT DURATION: 12 weeks  PLANNED INTERVENTIONS: self care/ADL training, therapeutic exercise, therapeutic activity, neuromuscular re-education, manual therapy, passive range of motion, gait training, balance training, stair training, functional mobility training, aquatic therapy, ultrasound, paraffin, moist heat, cryotherapy, patient/family education, energy conservation, coping strategies training, DME and/or AE instructions, and Re-evaluation  RECOMMENDED OTHER SERVICES: PT  CONSULTED AND AGREED WITH PLAN OF CARE: Patient and family member/caregiver  PLAN FOR NEXT SESSION: initial HEP for coordination, closed chain shoulder flexion- ask about hx of shoulder surgery   Eryka Dolinger, OT 12/12/2022, 10:11 AM

## 2022-12-10 NOTE — Therapy (Unsigned)
OUTPATIENT PHYSICAL THERAPY LOWER EXTREMITY EVALUATION   Patient Name: Dana [name] MRN: [id number] DOB:Aug 13, 1966, 57 y.o., female Today's Date: 12/12/2022  END OF SESSION:  PT End of Session - 12/12/22 0749     Visit Number 1    Date for PT Re-Evaluation 03/06/23    PT Start Time 0750    PT Stop Time 0834    PT Time Calculation (min) 44 min    Equipment Utilized During Treatment Gait belt             Past Medical History:  Diagnosis Date   Anxiety    Back pain with radiation    Breast mass, right    Chronic pain    Chronic, continuous use of opioids    Complication of anesthesia 04/07/14   Allergic reaction to Lisinopril immediately following surgery   DJD (degenerative joint disease)    Fibromyalgia    Groin abscess    Headache(784.0)    History of IBS    Hypercholesterolemia    IBS (irritable bowel syndrome)    Lactose intolerance    Mild hypertension    Obesity    Tobacco use disorder    Umbilical hernia    Symptomatic   Past Surgical History:  Procedure Laterality Date   ABDOMINAL HYSTERECTOMY     ANTERIOR CERVICAL DECOMP/DISCECTOMY FUSION N/A 12/20/2015   Procedure: ANTERIOR CERVICAL DECOMPRESSION FUSION CERVICAL 4-5, CERVICAL 5-6, CERVICAL 6-7 WITH INSTRUMENTATION AND ALLOGRAFT;  Surgeon: Phylliss Bob, MD;  Location: [place];  Service: Orthopedics;  Laterality: N/A;  Anterior cervical decompression fusion, cervical 4-5, cervical 5-6, cervical 6-7 with instrumentation and allograft   BACK SURGERY     BILATERAL SALPINGECTOMY  09/03/2012   Procedure: BILATERAL SALPINGECTOMY;  Surgeon: [name], MD;  Location: [facility];  Service: Gynecology;  Laterality: Bilateral;   BREAST BIOPSY Right 04/07/2014   Procedure: REMOVAL RIGHT BREAST MASS WITH WIRE LOCALIZATION;  Surgeon: [name], MD;  Location: [place];  Service: General;  Laterality: Right;   BREAST EXCISIONAL BIOPSY Left    x2   BREAST LUMPECTOMY     x2   CARPAL TUNNEL RELEASE Left  05/11/2019   Procedure: LEFT CARPAL TUNNEL RELEASE, RIGHT TENNIS ELBOW MARCAINE/DEPO MEDROL INJECTION UNDER ANESTHESIA;  Surgeon: Jessy Oto, MD;  Location: [place];  Service: Orthopedics;  Laterality: Left;   COLONOSCOPY W/ BIOPSIES  04/24/2012   per Dr. Carlean Purl, clear, repeat in 10 yrs    disectomy     ESOPHAGOGASTRODUODENOSCOPY     FINGER SURGERY     Right index-excision of mass    FOOT SURGERY Right    Bone Spurs   LAPAROSCOPIC HYSTERECTOMY  09/03/2012   Procedure: HYSTERECTOMY TOTAL LAPAROSCOPIC;  Surgeon: [name], MD;  Location: [facility];  Service: Gynecology;  Laterality: N/A;   LUMBAR DISC SURGERY     TUBAL LIGATION     UMBILICAL HERNIA REPAIR N/A 05/01/2018   Procedure: UMBILICAL HERNIA REPAIR;  Surgeon: Judeth Horn, MD;  Location: [place];  Service: General;  Laterality: N/A;   Patient Active Problem List   Diagnosis Date Noted   Guillain Barr syndrome ([place]) 10/01/2022   UTI (urinary tract infection) 09/24/2022   Hypomagnesemia 09/24/2022   Debility 09/23/2022   Hypokalemia 09/23/2022   B12 deficiency 09/05/2022   Folate deficiency 09/05/2022   Carpal tunnel syndrome, left upper limb 05/11/2019    Class: Chronic   Spinal stenosis of lumbar region with neurogenic claudication 08/20/2018   Congenital deformity of finger  09/12/2017   Primary osteoarthritis of right knee 02/27/2017   Right tennis elbow 11/11/2016   Muscle cramps 01/15/2016   Bilateral leg edema 01/08/2016   Radiculopathy 12/20/2015   Hyperglycemia 12/21/2014   Mastodynia, female 11/30/2014   S/P excision of fibroadenoma of breast 11/30/2014   Angioedema of lips 04/07/2014   Fibroadenoma of right breast 03/07/2014   Pelvic pain in female 06/19/2012   Menorrhagia 06/11/2012   SUI (stress urinary incontinence, female) 06/11/2012   HTN (hypertension) 06/11/2012   IBS (irritable bowel syndrome) - diarrhea predominant 03/17/2012   MICROSCOPIC HEMATURIA 11/30/2010   BREAST PAIN, RIGHT 11/30/2010    BREAST MASS, RIGHT 01/12/2010   HYPERCHOLESTEROLEMIA 12/07/2007   CIGARETTE SMOKER 12/07/2007   DEGENERATIVE JOINT DISEASE 12/07/2007   Low back pain with sciatica 12/07/2007   HEADACHE 12/07/2007   Obesity 08/03/2007   Anxiety state 08/03/2007    PCP: [name], MD  REFERRING PROVIDER: [name], MD  REFERRING DIAG: G61.0 (ICD-10-CM) - Guillain Barr syndrome [facility])   THERAPY DIAG:  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 She remained until Feb 21st, including time on rehab. She was D/C'd home with HHPT.   OBJECTIVE STATEMENT: Dana [name] is a 57 y.o. female who presented to the [facility] ED on 09/23/2022 with bilateral lower extremity weakness with decreased mobility that has progressed to both arms. She also reported numbness and tingling of the extremities. She was transferred to [facility] for MRI. 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  Inpatient Rehab F/B HHPT.  R knee injection 10/08/22 trigger point injections for worsening back pain 2/15  RA  PAIN:  Are you having pain? Yes: NPRS scale: up to 10/10 Pain location: all over, but her R knee pain is worst,  Pain description: Sharp Aggravating factors: Has difficulty standing up Relieving factors: Medicine helps somewhat  PRECAUTIONS: Fall  WEIGHT BEARING RESTRICTIONS: No  FALLS:  Has patient fallen in last 6 months? No  LIVING ENVIRONMENT: Lives with: lives with their family Lives in: House/apartment Stairs: [place] a ramp Has following equipment at home: Environmental consultant - 2 wheeled, Wheelchair (power), Electronics engineer, bed side commode, Ramped entry, and [employer] lift  OCCUPATION: on disability due to RA and a fall several years ago  PLOF: Independent  PATIENT GOALS:  walk  NEXT MD VISIT: She is scheduled to follow up with Dr. Adrian Saran with Physical Med/Rehab on 12-30-22. She is scheduled to follow up with Dr. Marcial Pacas at Neurology on 01-28-23.   OBJECTIVE:   DIAGNOSTIC FINDINGS:  Right knee radiographs 11/09/2020   FINDINGS: Severe patellofemoral joint space narrowing. Severe superior and mild inferior patellar degenerative osteophytosis. Moderate superior trochlear degenerative osteophytosis. No joint effusion. Severe medial compartment joint space narrowing with moderate peripheral medial and lateral compartment degenerative osteophytes.  COGNITION: Overall cognitive status: Within functional limits for tasks assessed     SENSATION: Light touch: Impaired  and B hands and feet and lower legs  EDEMA:  BLE edema noted.  MUSCLE LENGTH: Hamstrings: WFL Thomas test: B tightness, to neutral  POSTURE: rounded shoulders and flexed trunk   PALPATION: No TTP  LOWER EXTREMITY ROM: BLE ROM limited in all planes an all joints due to body habitus   LOWER EXTREMITY MMT:  MMT Right eval Left eval  Hip flexion  3 3-  Hip extension 3- 3-  Hip abduction 3 3-  Hip adduction    Hip internal rotation    Hip external rotation    Knee flexion 4 4-  Knee extension 4- 4-  Ankle dorsiflexion 4- 4-  Ankle plantarflexion    Ankle inversion 4- 4-  Ankle eversion 4- 4-   (Blank rows = not tested)  FUNCTIONAL TESTS:  5 times sit to stand: unable to complete Timed up and go (TUG): unable to complete  Bed Mobility: Occasional min A for sup <> sit, rolled B on mat with great effort, light min A  TRANSFERS: Scooting transfer with CGA, stand pivot with CGA, she reports that she sometimes has difficulty rising due to her knee pain  GAIT: Distance walked: 0' Assistive device utilized:  shopping cart-will bring her RW from home. Level of assistance: Min A Comments: Stood and weight shifted, then stood again and took steps to turn to  W/C.   TODAY'S TREATMENT:                                                                                                                              DATE:  12/12/22  Education  PATIENT EDUCATION:  Education details: POC Person educated: Patient Education method: Explanation Education comprehension: verbalized understanding  HOME EXERCISE PROGRAM: TBD  ASSESSMENT:  CLINICAL IMPRESSION: Patient is a 57 y.o. who was seen today for physical therapy evaluation and treatment for post GBS. She also has RA and severe R knee degeneration and pain. She demonstrates weakness, decreased balance, decreased safety and I with all functional mobility, R knee pain, decreased activity tolerance. She will benefit from PT to address her deficits and build her confidence and I with all mobility.  OBJECTIVE IMPAIRMENTS: Abnormal gait, decreased activity tolerance, decreased balance, decreased coordination, decreased endurance, difficulty walking, decreased ROM, decreased strength, and postural dysfunction.   ACTIVITY LIMITATIONS: carrying, lifting, bending, standing, squatting, stairs, transfers, and locomotion level  PARTICIPATION LIMITATIONS: meal prep, cleaning, laundry, driving, and shopping  PERSONAL FACTORS: Fitness, Past/current experiences, and 1 comorbidity: Recently diagnosed with GBS  are also affecting patient's functional outcome.   REHAB POTENTIAL: Good  CLINICAL DECISION MAKING: Evolving/moderate complexity  EVALUATION COMPLEXITY: Moderate   GOALS: Goals reviewed with patient? Yes  SHORT TERM GOALS: Target date: 11/28/22 I with initial HEP Baseline: Goal status: INITIAL  LONG TERM GOALS: Target date: 03/06/23  I with final HEP Baseline:  Goal status: INITIAL  2.  Decrease 5x STS to < 14 sec Baseline: unable to complete Goal status: INITIAL  3.  Decrease TUG to < 20 sec Baseline: Unable to complete. Goal status: INITIAL  4.  Patient will ambulate at least 300' with  LRAD, MI, on level and unlevel surfaces. Baseline: Stand pivot transfer with CGA Goal status: INITIAL  5.  Perform all bed mobility with I Baseline: Occ min A on mat Goal status: INITIAL  6.  Increase BLE strength to  at least 4/5 throughout. Baseline: (3-)-3/5 Goal status: INITIAL   PLAN:  PT FREQUENCY: 1-2x/week  PT DURATION: 12 weeks  PLANNED INTERVENTIONS: Therapeutic exercises, Therapeutic activity, Neuromuscular re-education, Balance training, Gait training, Patient/Family education, Self Care, Joint mobilization, Stair training, Dry Needling, Electrical stimulation, Cryotherapy, Moist heat, Ultrasound, Ionotophoresis 4mg /ml Dexamethasone, and Manual therapy  PLAN FOR NEXT SESSION: Initiate HEP, standing, gait.   Marcelina Morel, DPT 12/12/2022, 11:02 AM

## 2022-12-11 NOTE — Addendum Note (Signed)
Addended by: Rosalyn Gess D on: 12/11/2022 03:36 PM   Modules accepted: Orders

## 2022-12-11 NOTE — Addendum Note (Signed)
Addended by: Rosalyn Gess D on: 12/11/2022 03:37 PM   Modules accepted: Orders

## 2022-12-12 ENCOUNTER — Ambulatory Visit: Payer: 59 | Admitting: Occupational Therapy

## 2022-12-12 ENCOUNTER — Ambulatory Visit: Payer: 59 | Attending: Family Medicine | Admitting: Physical Therapy

## 2022-12-12 ENCOUNTER — Encounter: Payer: Self-pay | Admitting: Physical Therapy

## 2022-12-12 ENCOUNTER — Encounter (HOSPITAL_COMMUNITY): Payer: Self-pay | Admitting: General Surgery

## 2022-12-12 ENCOUNTER — Encounter: Payer: Self-pay | Admitting: Occupational Therapy

## 2022-12-12 ENCOUNTER — Other Ambulatory Visit (HOSPITAL_COMMUNITY): Payer: Self-pay | Admitting: General Surgery

## 2022-12-12 DIAGNOSIS — K769 Liver disease, unspecified: Secondary | ICD-10-CM

## 2022-12-12 DIAGNOSIS — M6281 Muscle weakness (generalized): Secondary | ICD-10-CM

## 2022-12-12 DIAGNOSIS — R278 Other lack of coordination: Secondary | ICD-10-CM | POA: Insufficient documentation

## 2022-12-12 DIAGNOSIS — R2681 Unsteadiness on feet: Secondary | ICD-10-CM

## 2022-12-12 DIAGNOSIS — R2689 Other abnormalities of gait and mobility: Secondary | ICD-10-CM | POA: Diagnosis present

## 2022-12-12 DIAGNOSIS — G610 Guillain-Barre syndrome: Secondary | ICD-10-CM

## 2022-12-12 DIAGNOSIS — R262 Difficulty in walking, not elsewhere classified: Secondary | ICD-10-CM | POA: Diagnosis present

## 2022-12-13 LAB — DRUG MONITOR, PANEL 1, W/CONF, URINE
Amphetamine: NEGATIVE ng/mL (ref <250)
Amphetamines: NEGATIVE ng/mL (ref <500)
Barbiturates: NEGATIVE ng/mL (ref <300)
Benzodiazepines: NEGATIVE ng/mL (ref <100)
Cocaine Metabolite: NEGATIVE ng/mL (ref <150)
Codeine: NEGATIVE ng/mL (ref <50)
Creatinine: 70 mg/dL (ref >=20.0)
Hydrocodone: NEGATIVE ng/mL (ref <50)
Hydromorphone: 2771 ng/mL — ABNORMAL HIGH (ref <50)
Marijuana Metabolite: 163 ng/mL — ABNORMAL HIGH (ref <5)
Marijuana Metabolite: POSITIVE ng/mL — AB (ref <20)
Methadone Metabolite: NEGATIVE ng/mL (ref <100)
Methamphetamine: NEGATIVE ng/mL (ref <250)
Morphine: NEGATIVE ng/mL (ref <50)
Norhydrocodone: NEGATIVE ng/mL (ref <50)
Opiates: POSITIVE ng/mL — AB (ref <100)
Oxidant: NEGATIVE ug/mL (ref <200)
Oxycodone: NEGATIVE ng/mL (ref <100)
Phencyclidine: NEGATIVE ng/mL (ref <25)
pH: 6.5 (ref 4.5–9.0)

## 2022-12-13 LAB — DM TEMPLATE

## 2022-12-16 NOTE — Therapy (Signed)
OUTPATIENT OCCUPATIONAL THERAPY NEURO EVALUATION  Patient Name: Dana [name] MRN: [id number] DOB:[birth year], 57 y.o., female Today's Date: 12/12/2022  PCP: Dr. Clent Ridges REFERRING PROVIDER: Dr. Gershon Crane  END OF SESSION:  OT End of Session - 12/12/22 0949     Visit Number 1    Number of Visits 25    Date for OT Re-Evaluation 03/06/23    Authorization Type UHC, Medicare    Progress Note Due on Visit 10    OT Start Time 0845    OT Stop Time 0930    OT Time Calculation (min) 45 min             Past Medical History:  Diagnosis Date   Anxiety    Back pain with radiation    Breast mass, right    Chronic pain    Chronic, continuous use of opioids    Complication of anesthesia 04/07/14   Allergic reaction to Lisinopril immediately following surgery   DJD (degenerative joint disease)    Fibromyalgia    Groin abscess    Headache(784.0)    History of IBS    Hypercholesterolemia    IBS (irritable bowel syndrome)    Lactose intolerance    Mild hypertension    Obesity    Tobacco use disorder    Umbilical hernia    Symptomatic   Past Surgical History:  Procedure Laterality Date   ABDOMINAL HYSTERECTOMY     ANTERIOR CERVICAL DECOMP/DISCECTOMY FUSION N/A 12/20/2015   Procedure: ANTERIOR CERVICAL DECOMPRESSION FUSION CERVICAL 4-5, CERVICAL 5-6, CERVICAL 6-7 WITH INSTRUMENTATION AND ALLOGRAFT;  Surgeon: Estill Bamberg, MD;  Location: [facility];  Service: Orthopedics;  Laterality: N/A;  Anterior cervical decompression fusion, cervical 4-5, cervical 5-6, cervical 6-7 with instrumentation and allograft   BACK SURGERY     BILATERAL SALPINGECTOMY  09/03/2012   Procedure: BILATERAL SALPINGECTOMY;  Surgeon: Ok Edwards, MD;  Location: [facility];  Service: Gynecology;  Laterality: Bilateral;   BREAST BIOPSY Right 04/07/2014   Procedure: REMOVAL RIGHT BREAST MASS WITH WIRE LOCALIZATION;  Surgeon: Adolph Pollack, MD;  Location: [facility];  Service: General;  Laterality: Right;   BREAST  EXCISIONAL BIOPSY Left    x2   BREAST LUMPECTOMY     x2   CARPAL TUNNEL RELEASE Left 05/11/2019   Procedure: LEFT CARPAL TUNNEL RELEASE, RIGHT TENNIS ELBOW MARCAINE/DEPO MEDROL INJECTION UNDER ANESTHESIA;  Surgeon: Kerrin Champagne, MD;  Location: [facility];  Service: Orthopedics;  Laterality: Left;   COLONOSCOPY W/ BIOPSIES  04/24/2012   per Dr. Leone Payor, clear, repeat in 10 yrs    disectomy     ESOPHAGOGASTRODUODENOSCOPY     FINGER SURGERY     Right index-excision of mass    FOOT SURGERY Right    Bone Spurs   LAPAROSCOPIC HYSTERECTOMY  09/03/2012   Procedure: HYSTERECTOMY TOTAL LAPAROSCOPIC;  Surgeon: Ok Edwards, MD;  Location: [facility];  Service: Gynecology;  Laterality: N/A;   LUMBAR DISC SURGERY     TUBAL LIGATION     UMBILICAL HERNIA REPAIR N/A 05/01/2018   Procedure: UMBILICAL HERNIA REPAIR;  Surgeon: Jimmye Norman, MD;  Location: [facility];  Service: General;  Laterality: N/A;   Patient Active Problem List   Diagnosis Date Noted   Guillain Barr syndrome ([facility]) 10/01/2022   UTI (urinary tract infection) 09/24/2022   Hypomagnesemia 09/24/2022   Debility 09/23/2022   Hypokalemia 09/23/2022   B12 deficiency 09/05/2022   Folate deficiency 09/05/2022   Carpal tunnel syndrome, left upper limb 05/11/2019  Class: Chronic   Spinal stenosis of lumbar region with neurogenic claudication 08/20/2018   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   Primary osteoarthritis of right knee 02/27/2017   Right tennis elbow 11/11/2016   Muscle cramps 01/15/2016   Bilateral leg edema 01/08/2016   Radiculopathy 12/20/2015   Hyperglycemia 12/21/2014   Mastodynia, female 11/30/2014   S/P excision of fibroadenoma of breast 11/30/2014   Angioedema of lips 04/07/2014   Fibroadenoma of right breast 03/07/2014   Pelvic pain in female 06/19/2012   Menorrhagia 06/11/2012   SUI (stress urinary incontinence, female) 06/11/2012   HTN (hypertension) 06/11/2012   IBS (irritable bowel syndrome) - diarrhea  predominant 03/17/2012   MICROSCOPIC HEMATURIA 11/30/2010   BREAST PAIN, RIGHT 11/30/2010   BREAST MASS, RIGHT 01/12/2010   HYPERCHOLESTEROLEMIA 12/07/2007   CIGARETTE SMOKER 12/07/2007   DEGENERATIVE JOINT DISEASE 12/07/2007   Low back pain with sciatica 12/07/2007   HEADACHE 12/07/2007   Obesity 08/03/2007   Anxiety state 08/03/2007    ONSET DATE: 12/05/22- referral date  REFERRING DIAG: G61.0 (ICD-10-CM) - Guillain Barr syndrome ([facility])  THERAPY DIAG:    Rationale for Evaluation and Treatment: Rehabilitation  SUBJECTIVE:   SUBJECTIVE STATEMENT: Pt denies pain Pt accompanied by: husband  Will   PERTINENT HISTORY: Pt is a 57 y/o female hospitalized 09/23/22  with progressive weakness and sensation changes, consistent with Guillain-Barr syndrome. MRI negative. LP results and assessment most consistent with Guillain-Barr syndrome.  Pt was d/c home from CIR 11/06/22 PMH includes: glaucoma anxiety, DJD, fibromyalgia, HTN, tobacco use, B-12 deficiency, OA, ACDF 2017. Home health OT/PT wrapped up last week.  PRECAUTIONS: Fall  WEIGHT BEARING RESTRICTIONS: no  PAIN: no  FALLS: Has patient fallen in last 6 months? No  LIVING ENVIRONMENT: Lives with: lives with their spouse Lives in: House/apartment Stairs: has ramp built Has following equipment at home:   BSC, steadi,  tub bench but can't access bathroom   PLOF: Independent  PATIENT GOALS: increase independence   OBJECTIVE:   HAND DOMINANCE: Right  ADLs: Overall ADLs: assistance required, unable to access BR Transfers/ambulation related to ADLs: Eating: unable to cut food Grooming: set up UB Dressing: set up for bra and shirt LB Dressing: max A Toileting: uses BSC uses steadi Bathing: min-mod A in hospital  bed  Tub Shower transfers: n/a Equipment: Emergency planning/management officer  IADLs:dependent for IADLS   MOBILITY STATUS:  uses w/c  , per PT report minguard for transfer from w/c to mat    ACTIVITY  TOLERANCE: Activity tolerance: Pt fatigues quickly    UPPER EXTREMITY ROM:    Active ROM Right eval Left eval  Shoulder flexion 120 100  Shoulder abduction 90 90  Shoulder adduction    Shoulder extension    Shoulder internal rotation    Shoulder external rotation    Elbow flexion    Elbow extension  -25  Wrist flexion    Wrist extension    Wrist ulnar deviation    Wrist radial deviation    Wrist pronation    Wrist supination    (Blank rows = not tested)  UPPER EXTREMITY MMT:     MMT Right eval Left eval  Shoulder flexion 3+/5 3-/5  Shoulder abduction    Shoulder adduction    Shoulder extension    Shoulder internal rotation    Shoulder external rotation    Middle trapezius    Lower trapezius    Elbow flexion 3+/5 3/5  Elbow extension 3/5 3/5  Wrist flexion  Wrist extension    Wrist ulnar deviation    Wrist radial deviation    Wrist pronation    Wrist supination    (Blank rows = not tested)  HAND FUNCTION: Grip strength: Right: 32 lbs; Left: 18 lbs  COORDINATION: 9 Hole Peg test: Right: 1 min 48 sec; Left: 4 pegs in 2 mins  Box and Blocks:  Right 35 blocks, Left  26 blocks  SENSATION: Light touch: WFL Hot/Cold: Not tested    COGNITION: Overall cognitive status:NT   VISION: Subjective report: reports hx of glaucoma VISION ASSESSMENT: Not tested  OBSERVATIONS: Pt is eager to improve, pt's husband is very supportive   TODAY'S TREATMENT:                                                                                                                              DATE: 12/17/22-Pt was instructed in coordination HEP, pt demonstrates min difficulty with RUE with most exercises , mod difficulty with left for most exercises, however mod difficulty dealing cards with RUE, min v.c for performance. Shelf liner used for coins for increased ease. Cane exercises for chest press x 20 and shoulder flexion x 10 reps min v.c to avoid compensation.   PATIENT  EDUCATION: Education details: coordination HEP, cane exercises seated Person educated: Patient and Spouse Education method: Explanation, Demonstration, Verbal cues, and Handouts Education comprehension: verbalized understanding, returned demonstration, and verbal cues required    HOME EXERCISE PROGRAM:  12/17/22- coordination HEP, cane exercises seated   GOALS: Goals reviewed with patient? Yes  SHORT TERM GOALS: Target date: 01/11/23  I with initial HEP Baseline: Goal status: INITIAL  2.  Pt will demonstrate improved UE functional use as evidenced increasing bilateral box/ blocks score by 4 blocks.  Baseline: R 35, L 26 Goal status: INITIAL  3.  Pt will donn pants with mod A. Baseline: max A Goal status: INITIAL  4. Pt will verbalize understanding of adapted strategies to maximize safety and I with ADLs/ IADLs .   Goal status: INITIAL  5.  Pt will demonstrate ability to stand for functional activity with min A x 10 mins in prep for ADLs.  Goal status: INITIAL  6.  Pt will perform w/c to [facility] transfers mod I  Goal status: INITIAL  LONG TERM GOALS: Target date: 03/06/23  I with updated HEP  Goal status: INITIAL  2.  Pt will perform LB dressing mod I. Baseline:max A Goal status: INITIAL  3.  Pt will bathe with supervision/ set up. Baseline: min-mod A for sponge bath Goal status: INITIAL  4.  Pt will perform basic cooking at a w/c level mod I  Goal status: INITIAL  5.  Pt will demonstrate improved fine motor coordination as evidenced by performing LUE 9 hole peg test in 2 mins or less.   Goal status: INITIAL  6.  Pt will demonstrate improved fine motor coordination as evidenced by performing RUE 9 hole peg test  in 1 mins  30 secs or less.  Baseline: RUE  1 min 48 secs, LUE 4 pegs placed in 2 mins  Goal status: INITIAL  ASSESSMENT:  CLINICAL IMPRESSION: Pt is progressing towards goal. She demonstrates understanding of initial HEPs. PERFORMANCE DEFICITS: in  functional skills including ADLs, IADLs, coordination, dexterity, sensation, ROM, strength, flexibility, Fine motor control, Gross motor control, mobility, balance, endurance, decreased knowledge of precautions, decreased knowledge of use of DME, and UE functional use, cognitive skills including  and psychosocial skills including coping strategies, environmental adaptation, habits, interpersonal interactions, and routines and behaviors.   IMPAIRMENTS: are limiting patient from ADLs, IADLs, rest and sleep, play, leisure, and social participation.   CO-MORBIDITIES: may have co-morbidities  that affects occupational performance. Patient will benefit from skilled OT to address above impairments and improve overall function.  MODIFICATION OR ASSISTANCE TO COMPLETE EVALUATION: No modification of tasks or assist necessary to complete an evaluation.  OT OCCUPATIONAL PROFILE AND HISTORY: Detailed assessment: Review of records and additional review of physical, cognitive, psychosocial history related to current functional performance.  CLINICAL DECISION MAKING: LOW - limited treatment options, no task modification necessary  REHAB POTENTIAL: Good  EVALUATION COMPLEXITY: Low    PLAN:  OT FREQUENCY: 2x/week  OT DURATION: 12 weeks  PLANNED INTERVENTIONS: self care/ADL training, therapeutic exercise, therapeutic activity, neuromuscular re-education, manual therapy, passive range of motion, gait training, balance training, stair training, functional mobility training, aquatic therapy, ultrasound, paraffin, moist heat, cryotherapy, patient/family education, energy conservation, coping strategies training, DME and/or AE instructions, and Re-evaluation  RECOMMENDED OTHER SERVICES: PT  CONSULTED AND AGREED WITH PLAN OF CARE: Patient and family member/caregiver  PLAN FOR NEXT SESSION:  review closed chain exercises and then add shoulder abduction, ADL strategies  Lakin Rhine, OT 12/12/2022, 10:11  AM

## 2022-12-17 ENCOUNTER — Ambulatory Visit: Payer: 59 | Attending: Family Medicine | Admitting: Occupational Therapy

## 2022-12-17 DIAGNOSIS — R278 Other lack of coordination: Secondary | ICD-10-CM | POA: Diagnosis present

## 2022-12-17 DIAGNOSIS — G610 Guillain-Barre syndrome: Secondary | ICD-10-CM | POA: Insufficient documentation

## 2022-12-17 DIAGNOSIS — M6281 Muscle weakness (generalized): Secondary | ICD-10-CM | POA: Diagnosis present

## 2022-12-17 DIAGNOSIS — R262 Difficulty in walking, not elsewhere classified: Secondary | ICD-10-CM | POA: Insufficient documentation

## 2022-12-17 DIAGNOSIS — R2681 Unsteadiness on feet: Secondary | ICD-10-CM | POA: Diagnosis present

## 2022-12-17 DIAGNOSIS — R2689 Other abnormalities of gait and mobility: Secondary | ICD-10-CM | POA: Diagnosis present

## 2022-12-17 NOTE — Patient Instructions (Signed)
  Coordination Activities  Perform the following activities for 10-20 minutes 1 times per day with both hand(s).  Pass putty ball between  your hands Flip cards 1 at a time as fast as you can. Deal cards with your thumb (Hold deck in hand and push card off top with thumb). Pick up coins and place in container or coin bank. Pick up coins and stack. Pick up coins until you get  5 in your hand, then move coins from palm to fingertips to place in container Practice writing and/or typing.     SCAPULA: Retraction    Hold cane with both hands. Pinch shoulder blades together. Do not shrug shoulders. Hold __5_ seconds. _10-20__ reps per set, _1-2__ sets per day, _4-5__ days per week    SHOULDER: Flexion - Sitting    Hold cane with both hands. Raise arms up. Keep elbows straight. Hold __5_ seconds. No  weight right now   _10-20__ reps per set, _1__ sets per day, _4-5__ days per week  Copyright  VHI. All rights reserved.   Copyright  VHI. All rights reserved.

## 2022-12-19 ENCOUNTER — Ambulatory Visit: Payer: 59 | Admitting: Occupational Therapy

## 2022-12-24 ENCOUNTER — Encounter: Payer: Self-pay | Admitting: Occupational Therapy

## 2022-12-24 ENCOUNTER — Ambulatory Visit: Payer: 59 | Admitting: Occupational Therapy

## 2022-12-24 DIAGNOSIS — R278 Other lack of coordination: Secondary | ICD-10-CM | POA: Diagnosis not present

## 2022-12-24 DIAGNOSIS — R2681 Unsteadiness on feet: Secondary | ICD-10-CM

## 2022-12-24 DIAGNOSIS — R2689 Other abnormalities of gait and mobility: Secondary | ICD-10-CM

## 2022-12-24 DIAGNOSIS — M6281 Muscle weakness (generalized): Secondary | ICD-10-CM

## 2022-12-24 NOTE — Therapy (Signed)
OUTPATIENT OCCUPATIONAL THERAPY NEURO Treatment  Patient Name: Dana [name] MRN: [id number] DOB:[birth year], 57 y.o., female Today's Date: 12/24/2022  PCP: Dr. Clent Ridges REFERRING PROVIDER: Dr. Gershon Crane  END OF SESSION:  OT End of Session - 12/24/22 0959     Visit Number 3    Number of Visits 25    Date for OT Re-Evaluation 03/06/23    Authorization Type UHC, Medicare    Authorization - Visit Number 3    Progress Note Due on Visit 10    OT Start Time 0840    OT Stop Time 0930    OT Time Calculation (min) 50 min              Past Medical History:  Diagnosis Date   Anxiety    Back pain with radiation    Breast mass, right    Chronic pain    Chronic, continuous use of opioids    Complication of anesthesia 04/07/14   Allergic reaction to Lisinopril immediately following surgery   DJD (degenerative joint disease)    Fibromyalgia    Groin abscess    Headache(784.0)    History of IBS    Hypercholesterolemia    IBS (irritable bowel syndrome)    Lactose intolerance    Mild hypertension    Obesity    Tobacco use disorder    Umbilical hernia    Symptomatic   Past Surgical History:  Procedure Laterality Date   ABDOMINAL HYSTERECTOMY     ANTERIOR CERVICAL DECOMP/DISCECTOMY FUSION N/A 12/20/2015   Procedure: ANTERIOR CERVICAL DECOMPRESSION FUSION CERVICAL 4-5, CERVICAL 5-6, CERVICAL 6-7 WITH INSTRUMENTATION AND ALLOGRAFT;  Surgeon: Estill Bamberg, MD;  Location: [facility] OR;  Service: Orthopedics;  Laterality: N/A;  Anterior cervical decompression fusion, cervical 4-5, cervical 5-6, cervical 6-7 with instrumentation and allograft   BACK SURGERY     BILATERAL SALPINGECTOMY  09/03/2012   Procedure: BILATERAL SALPINGECTOMY;  Surgeon: Ok Edwards, MD;  Location: [facility];  Service: Gynecology;  Laterality: Bilateral;   BREAST BIOPSY Right 04/07/2014   Procedure: REMOVAL RIGHT BREAST MASS WITH WIRE LOCALIZATION;  Surgeon: Adolph Pollack, MD;  Location: [facility] OR;  Service: General;   Laterality: Right;   BREAST EXCISIONAL BIOPSY Left    x2   BREAST LUMPECTOMY     x2   CARPAL TUNNEL RELEASE Left 05/11/2019   Procedure: LEFT CARPAL TUNNEL RELEASE, RIGHT TENNIS ELBOW MARCAINE/DEPO MEDROL INJECTION UNDER ANESTHESIA;  Surgeon: Kerrin Champagne, MD;  Location: [facility] OR;  Service: Orthopedics;  Laterality: Left;   COLONOSCOPY W/ BIOPSIES  04/24/2012   per Dr. Leone Payor, clear, repeat in 10 yrs    disectomy     ESOPHAGOGASTRODUODENOSCOPY     FINGER SURGERY     Right index-excision of mass    FOOT SURGERY Right    Bone Spurs   LAPAROSCOPIC HYSTERECTOMY  09/03/2012   Procedure: HYSTERECTOMY TOTAL LAPAROSCOPIC;  Surgeon: Ok Edwards, MD;  Location: [facility];  Service: Gynecology;  Laterality: N/A;   LUMBAR DISC SURGERY     TUBAL LIGATION     UMBILICAL HERNIA REPAIR N/A 05/01/2018   Procedure: UMBILICAL HERNIA REPAIR;  Surgeon: Jimmye Norman, MD;  Location: [facility] OR;  Service: General;  Laterality: N/A;   Patient Active Problem List   Diagnosis Date Noted   Guillain Barr syndrome 10/01/2022   UTI (urinary tract infection) 09/24/2022   Hypomagnesemia 09/24/2022   Debility 09/23/2022   Hypokalemia 09/23/2022   B12 deficiency 09/05/2022   Folate deficiency 09/05/2022  Carpal tunnel syndrome, left upper limb 05/11/2019    Class: Chronic   Spinal stenosis of lumbar region with neurogenic claudication 08/20/2018   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   Primary osteoarthritis of right knee 02/27/2017   Right tennis elbow 11/11/2016   Muscle cramps 01/15/2016   Bilateral leg edema 01/08/2016   Radiculopathy 12/20/2015   Hyperglycemia 12/21/2014   Mastodynia, female 11/30/2014   S/P excision of fibroadenoma of breast 11/30/2014   Angioedema of lips 04/07/2014   Fibroadenoma of right breast 03/07/2014   Pelvic pain in female 06/19/2012   Menorrhagia 06/11/2012   SUI (stress urinary incontinence, female) 06/11/2012   HTN (hypertension) 06/11/2012   IBS (irritable bowel  syndrome) - diarrhea predominant 03/17/2012   MICROSCOPIC HEMATURIA 11/30/2010   BREAST PAIN, RIGHT 11/30/2010   BREAST MASS, RIGHT 01/12/2010   HYPERCHOLESTEROLEMIA 12/07/2007   CIGARETTE SMOKER 12/07/2007   DEGENERATIVE JOINT DISEASE 12/07/2007   Low back pain with sciatica 12/07/2007   HEADACHE 12/07/2007   Obesity 08/03/2007   Anxiety state 08/03/2007    ONSET DATE: 12/05/22- referral date  REFERRING DIAG: G61.0 (ICD-10-CM) - Guillain Barr syndrome (HCC)  THERAPY DIAG:    Rationale for Evaluation and Treatment: Rehabilitation  SUBJECTIVE:   SUBJECTIVE STATEMENT: Pt denies pain Pt accompanied VC:[facility]  PERTINENT HISTORY: Pt is a 57 y/o female hospitalized 09/23/22  with progressive weakness and sensation changes, consistent with Guillain-Barr syndrome. MRI negative. LP results and assessment most consistent with Guillain-Barr syndrome.  Pt was d/c home from CIR 11/06/22 PMH includes: glaucoma anxiety, DJD, fibromyalgia, HTN, tobacco use, B-12 deficiency, OA, ACDF 2017. Home health OT/PT wrapped up last week.  PRECAUTIONS: Fall  WEIGHT BEARING RESTRICTIONS: no  PAIN: no  FALLS: Has patient fallen in last 6 months? No  LIVING ENVIRONMENT: Lives with: lives with their spouse Lives in: House/apartment Stairs: has ramp built Has following equipment at home:   BSC, steadi,  tub bench but can't access bathroom   PLOF: Independent  PATIENT GOALS: increase independence   OBJECTIVE:   HAND DOMINANCE: Right  ADLs: Overall ADLs: assistance required, unable to access BR Transfers/ambulation related to ADLs: Eating: unable to cut food Grooming: set up UB Dressing: set up for bra and shirt LB Dressing: max A Toileting: uses BSC uses steadi Bathing: min-mod A in hospital  bed  Tub Shower transfers: n/a Equipment: Emergency planning/management officer  IADLs:dependent for IADLS   MOBILITY STATUS:  uses w/c  , per PT report minguard for transfer from w/c to mat    ACTIVITY  TOLERANCE: Activity tolerance: Pt fatigues quickly    UPPER EXTREMITY ROM:    Active ROM Right eval Left eval  Shoulder flexion 120 100  Shoulder abduction 90 90  Shoulder adduction    Shoulder extension    Shoulder internal rotation    Shoulder external rotation    Elbow flexion    Elbow extension  -25  Wrist flexion    Wrist extension    Wrist ulnar deviation    Wrist radial deviation    Wrist pronation    Wrist supination    (Blank rows = not tested)  UPPER EXTREMITY MMT:     MMT Right eval Left eval  Shoulder flexion 3+/5 3-/5  Shoulder abduction    Shoulder adduction    Shoulder extension    Shoulder internal rotation    Shoulder external rotation    Middle trapezius    Lower trapezius    Elbow flexion 3+/5 3/5  Elbow extension 3/5  3/5  Wrist flexion    Wrist extension    Wrist ulnar deviation    Wrist radial deviation    Wrist pronation    Wrist supination    (Blank rows = not tested)  HAND FUNCTION: Grip strength: Right: 32 lbs; Left: 18 lbs  COORDINATION: 9 Hole Peg test: Right: 1 min 48 sec; Left: 4 pegs in 2 mins  Box and Blocks:  Right 35 blocks, Left  26 blocks  SENSATION: Light touch: WFL Hot/Cold: Not tested    COGNITION: Overall cognitive status:NT   VISION: Subjective report: reports hx of glaucoma VISION ASSESSMENT: Not tested  OBSERVATIONS: Pt is eager to improve, pt's husband is very supportive   TODAY'S TREATMENT:                                                                                                                              DATE: 12/24/22- Seated in w/c reviewed closed chain shoulder flexion and chest press then added shoulder abduction exercises 10 reps each, min v.c Pt transferred to mat scoot pivot with min guard assist and min v.c  Seated edge of mat shoulder flexion , biceps curls and chest press with 1 lb weight in each UE for increased strength and core control, min v.c Pt transitioned to supine with min  A. Shoulder flexion and chest press with 3 lbs bar in supine, pt's L arm was noted to fatigues more quickly than right. Supine to sit  with min A. Pt transferred scoot pivot back to w/c with min A. Arm bike x 5 mins level 1 for conditioning. 1 rest break required.    12/17/22-Pt was instructed in coordination HEP, pt demonstrates min difficulty with RUE with most exercises , mod difficulty with left for most exercises, however mod difficulty dealing cards with RUE, min v.c for performance. Shelf liner used for coins for increased ease. Cane exercises for chest press x 20 and shoulder flexion x 10 reps min v.c to avoid compensation.   PATIENT EDUCATION: See above for 12/24/22 HOME EXERCISE PROGRAM:  12/17/22- coordination HEP, cane exercises seated   GOALS: Goals reviewed with patient? Yes  SHORT TERM GOALS: Target date: 01/11/23  I with initial HEP Baseline: Goal status: INITIAL  2.  Pt will demonstrate improved UE functional use as evidenced increasing bilateral box/ blocks score by 4 blocks.  Baseline: R 35, L 26 Goal status: INITIAL  3.  Pt will donn pants with mod A. Baseline: max A Goal status: INITIAL  4. Pt will verbalize understanding of adapted strategies to maximize safety and I with ADLs/ IADLs .   Goal status: INITIAL  5.  Pt will demonstrate ability to stand for functional activity with min A x 10 mins in prep for ADLs.  Goal status: INITIAL  6.  Pt will perform w/c to [facility] transfers mod I  Goal status: INITIAL  LONG TERM GOALS: Target date: 03/06/23  I with updated HEP  Goal status: INITIAL  2.  Pt will perform LB dressing mod I. Baseline:max A Goal status: INITIAL  3.  Pt will bathe with supervision/ set up. Baseline: min-mod A for sponge bath Goal status: INITIAL  4.  Pt will perform basic cooking at a w/c level mod I  Goal status: INITIAL  5.  Pt will demonstrate improved fine motor coordination as evidenced by performing LUE 9 hole peg test in 2  mins or less.   Goal status: INITIAL  6.  Pt will demonstrate improved fine motor coordination as evidenced by performing RUE 9 hole peg test in 1 mins  30 secs or less.  Baseline: RUE  1 min 48 secs, LUE 4 pegs placed in 2 mins  Goal status: INITIAL  ASSESSMENT:  CLINICAL IMPRESSION: Pt is progressing towards goals with improving transfers and UE strength.  PERFORMANCE DEFICITS: in functional skills including ADLs, IADLs, coordination, dexterity, sensation, ROM, strength, flexibility, Fine motor control, Gross motor control, mobility, balance, endurance, decreased knowledge of precautions, decreased knowledge of use of DME, and UE functional use, cognitive skills including  and psychosocial skills including coping strategies, environmental adaptation, habits, interpersonal interactions, and routines and behaviors.   IMPAIRMENTS: are limiting patient from ADLs, IADLs, rest and sleep, play, leisure, and social participation.   CO-MORBIDITIES: may have co-morbidities  that affects occupational performance. Patient will benefit from skilled OT to address above impairments and improve overall function.  MODIFICATION OR ASSISTANCE TO COMPLETE EVALUATION: No modification of tasks or assist necessary to complete an evaluation.  OT OCCUPATIONAL PROFILE AND HISTORY: Detailed assessment: Review of records and additional review of physical, cognitive, psychosocial history related to current functional performance.  CLINICAL DECISION MAKING: LOW - limited treatment options, no task modification necessary  REHAB POTENTIAL: Good  EVALUATION COMPLEXITY: Low    PLAN:  OT FREQUENCY: 2x/week  OT DURATION: 12 weeks  PLANNED INTERVENTIONS: self care/ADL training, therapeutic exercise, therapeutic activity, neuromuscular re-education, manual therapy, passive range of motion, gait training, balance training, stair training, functional mobility training, aquatic therapy, ultrasound, paraffin, moist  heat, cryotherapy, patient/family education, energy conservation, coping strategies training, DME and/or AE instructions, and Re-evaluation  RECOMMENDED OTHER SERVICES: PT  CONSULTED AND AGREED WITH PLAN OF CARE: Patient and family member/caregiver  PLAN FOR NEXT SESSION:  fine motor coordination, UE strength and endurance  Dana [name], OT 12/24/2022, 9:59 AM

## 2022-12-26 ENCOUNTER — Ambulatory Visit: Payer: 59 | Admitting: Physical Therapy

## 2022-12-26 ENCOUNTER — Ambulatory Visit: Payer: 59 | Admitting: Occupational Therapy

## 2022-12-27 ENCOUNTER — Telehealth: Payer: Self-pay | Admitting: Family Medicine

## 2022-12-27 MED ORDER — HYDROMORPHONE HCL 4 MG PO TABS: 4.0000 mg | ORAL_TABLET | Freq: Four times a day (QID) | ORAL | 0 refills | Status: AC | PRN

## 2022-12-27 NOTE — Telephone Encounter (Signed)
Spoke with pt advised that Rx was sent as requested

## 2022-12-27 NOTE — Telephone Encounter (Signed)
I sent in for one month  

## 2022-12-27 NOTE — Telephone Encounter (Signed)
Pt called back and  [id number] [place], [place] - [address] AT [facility] OF [facility] & [address] Phone: [phone]  Fax: [phone]    Has medication in stock

## 2022-12-27 NOTE — Telephone Encounter (Signed)
Pt is calling and they are out of HYDROmorphone (DILAUDID) 4 MG tablet all mg national wide  [id number] - [place], [place] - [address] Phone: [phone]  Fax: [phone]

## 2022-12-30 ENCOUNTER — Encounter: Payer: Self-pay | Admitting: Physical Medicine and Rehabilitation

## 2022-12-30 ENCOUNTER — Encounter: Payer: 59 | Attending: Physical Medicine and Rehabilitation | Admitting: Physical Medicine and Rehabilitation

## 2022-12-30 VITALS — BP 137/86 | HR 78 | Ht 64.0 in

## 2022-12-30 DIAGNOSIS — G4701 Insomnia due to medical condition: Secondary | ICD-10-CM | POA: Diagnosis present

## 2022-12-30 DIAGNOSIS — M792 Neuralgia and neuritis, unspecified: Secondary | ICD-10-CM | POA: Diagnosis present

## 2022-12-30 DIAGNOSIS — Z993 Dependence on wheelchair: Secondary | ICD-10-CM

## 2022-12-30 DIAGNOSIS — G610 Guillain-Barre syndrome: Secondary | ICD-10-CM

## 2022-12-30 MED ORDER — CYCLOBENZAPRINE HCL 10 MG PO TABS: 10.0000 mg | ORAL_TABLET | Freq: Three times a day (TID) | ORAL | 5 refills | Status: DC

## 2022-12-30 MED ORDER — TRAZODONE HCL 50 MG PO TABS: 50.0000 mg | ORAL_TABLET | Freq: Every evening | ORAL | 5 refills | Status: DC | PRN

## 2022-12-30 MED ORDER — GABAPENTIN 300 MG PO CAPS: 900.0000 mg | ORAL_CAPSULE | Freq: Three times a day (TID) | ORAL | 5 refills | Status: DC

## 2022-12-30 NOTE — Therapy (Signed)
OUTPATIENT OCCUPATIONAL THERAPY NEURO Treatment  Patient Name: Dana [name] MRN: [id number] DOB:Oct 23, 1965, 57 y.o., female Today's Date: 12/30/2022  PCP: Dr. Clent Ridges REFERRING PROVIDER: Dr. Gershon Crane  END OF SESSION:     Past Medical History:  Diagnosis Date   Anxiety    Back pain with radiation    Breast mass, right    Chronic pain    Chronic, continuous use of opioids    Complication of anesthesia 04/07/14   Allergic reaction to Lisinopril immediately following surgery   DJD (degenerative joint disease)    Fibromyalgia    Groin abscess    Headache(784.0)    History of IBS    Hypercholesterolemia    IBS (irritable bowel syndrome)    Lactose intolerance    Mild hypertension    Obesity    Tobacco use disorder    Umbilical hernia    Symptomatic   Past Surgical History:  Procedure Laterality Date   ABDOMINAL HYSTERECTOMY     ANTERIOR CERVICAL DECOMP/DISCECTOMY FUSION N/A 12/20/2015   Procedure: ANTERIOR CERVICAL DECOMPRESSION FUSION CERVICAL 4-5, CERVICAL 5-6, CERVICAL 6-7 WITH INSTRUMENTATION AND ALLOGRAFT;  Surgeon: Estill Bamberg, MD;  Location: [facility] OR;  Service: Orthopedics;  Laterality: N/A;  Anterior cervical decompression fusion, cervical 4-5, cervical 5-6, cervical 6-7 with instrumentation and allograft   BACK SURGERY     BILATERAL SALPINGECTOMY  09/03/2012   Procedure: BILATERAL SALPINGECTOMY;  Surgeon: Ok Edwards, MD;  Location: [facility];  Service: Gynecology;  Laterality: Bilateral;   BREAST BIOPSY Right 04/07/2014   Procedure: REMOVAL RIGHT BREAST MASS WITH WIRE LOCALIZATION;  Surgeon: Adolph Pollack, MD;  Location: [facility] OR;  Service: General;  Laterality: Right;   BREAST EXCISIONAL BIOPSY Left    x2   BREAST LUMPECTOMY     x2   CARPAL TUNNEL RELEASE Left 05/11/2019   Procedure: LEFT CARPAL TUNNEL RELEASE, RIGHT TENNIS ELBOW MARCAINE/DEPO MEDROL INJECTION UNDER ANESTHESIA;  Surgeon: Kerrin Champagne, MD;  Location: [facility] OR;  Service: Orthopedics;  Laterality:  Left;   COLONOSCOPY W/ BIOPSIES  04/24/2012   per Dr. Leone Payor, clear, repeat in 10 yrs    disectomy     ESOPHAGOGASTRODUODENOSCOPY     FINGER SURGERY     Right index-excision of mass    FOOT SURGERY Right    Bone Spurs   LAPAROSCOPIC HYSTERECTOMY  09/03/2012   Procedure: HYSTERECTOMY TOTAL LAPAROSCOPIC;  Surgeon: Ok Edwards, MD;  Location: [facility];  Service: Gynecology;  Laterality: N/A;   LUMBAR DISC SURGERY     TUBAL LIGATION     UMBILICAL HERNIA REPAIR N/A 05/01/2018   Procedure: UMBILICAL HERNIA REPAIR;  Surgeon: Jimmye Norman, MD;  Location: [facility] OR;  Service: General;  Laterality: N/A;   Patient Active Problem List   Diagnosis Date Noted   Guillain Barr syndrome 10/01/2022   UTI (urinary tract infection) 09/24/2022   Hypomagnesemia 09/24/2022   Debility 09/23/2022   Hypokalemia 09/23/2022   B12 deficiency 09/05/2022   Folate deficiency 09/05/2022   Carpal tunnel syndrome, left upper limb 05/11/2019    Class: Chronic   Spinal stenosis of lumbar region with neurogenic claudication 08/20/2018   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   Primary osteoarthritis of right knee 02/27/2017   Right tennis elbow 11/11/2016   Muscle cramps 01/15/2016   Bilateral leg edema 01/08/2016   Radiculopathy 12/20/2015   Hyperglycemia 12/21/2014   Mastodynia, female 11/30/2014   S/P excision of fibroadenoma of breast 11/30/2014   Angioedema of lips 04/07/2014   Fibroadenoma  of right breast 03/07/2014   Pelvic pain in female 06/19/2012   Menorrhagia 06/11/2012   SUI (stress urinary incontinence, female) 06/11/2012   HTN (hypertension) 06/11/2012   IBS (irritable bowel syndrome) - diarrhea predominant 03/17/2012   MICROSCOPIC HEMATURIA 11/30/2010   BREAST PAIN, RIGHT 11/30/2010   BREAST MASS, RIGHT 01/12/2010   HYPERCHOLESTEROLEMIA 12/07/2007   CIGARETTE SMOKER 12/07/2007   DEGENERATIVE JOINT DISEASE 12/07/2007   Low back pain with sciatica 12/07/2007   HEADACHE 12/07/2007    Obesity 08/03/2007   Anxiety state 08/03/2007    ONSET DATE: 12/05/22- referral date  REFERRING DIAG: G61.0 (ICD-10-CM) - Guillain Barr syndrome (HCC)  THERAPY DIAG:    Rationale for Evaluation and Treatment: Rehabilitation  SUBJECTIVE:   SUBJECTIVE STATEMENT: Pt denies pain Pt accompanied ZO:[id number]  PERTINENT HISTORY: Pt is a 57 y/o female hospitalized 09/23/22  with progressive weakness and sensation changes, consistent with Guillain-Barr syndrome. MRI negative. LP results and assessment most consistent with Guillain-Barr syndrome.  Pt was d/c home from CIR 11/06/22 PMH includes: glaucoma anxiety, DJD, fibromyalgia, HTN, tobacco use, B-12 deficiency, OA, ACDF 2017. Home health OT/PT wrapped up last week.  PRECAUTIONS: Fall  WEIGHT BEARING RESTRICTIONS: no  PAIN: no  FALLS: Has patient fallen in last 6 months? No  LIVING ENVIRONMENT: Lives with: lives with their spouse Lives in: House/apartment Stairs: has ramp built Has following equipment at home:   BSC, steadi,  tub bench but can't access bathroom   PLOF: Independent  PATIENT GOALS: increase independence   OBJECTIVE:   HAND DOMINANCE: Right  ADLs: Overall ADLs: assistance required, unable to access BR Transfers/ambulation related to ADLs: Eating: unable to cut food Grooming: set up UB Dressing: set up for bra and shirt LB Dressing: max A Toileting: uses BSC uses steadi Bathing: min-mod A in hospital  bed  Tub Shower transfers: n/a Equipment: Emergency planning/management officer  IADLs:dependent for IADLS   MOBILITY STATUS:  uses w/c  , per PT report minguard for transfer from w/c to mat    ACTIVITY TOLERANCE: Activity tolerance: Pt fatigues quickly    UPPER EXTREMITY ROM:    Active ROM Right eval Left eval  Shoulder flexion 120 100  Shoulder abduction 90 90  Shoulder adduction    Shoulder extension    Shoulder internal rotation    Shoulder external rotation    Elbow flexion    Elbow extension  -25   Wrist flexion    Wrist extension    Wrist ulnar deviation    Wrist radial deviation    Wrist pronation    Wrist supination    (Blank rows = not tested)  UPPER EXTREMITY MMT:     MMT Right eval Left eval  Shoulder flexion 3+/5 3-/5  Shoulder abduction    Shoulder adduction    Shoulder extension    Shoulder internal rotation    Shoulder external rotation    Middle trapezius    Lower trapezius    Elbow flexion 3+/5 3/5  Elbow extension 3/5 3/5  Wrist flexion    Wrist extension    Wrist ulnar deviation    Wrist radial deviation    Wrist pronation    Wrist supination    (Blank rows = not tested)  HAND FUNCTION: Grip strength: Right: 32 lbs; Left: 18 lbs  COORDINATION: 9 Hole Peg test: Right: 1 min 48 sec; Left: 4 pegs in 2 mins  Box and Blocks:  Right 35 blocks, Left  26 blocks  SENSATION: Light touch: WFL Hot/Cold: Not  tested    COGNITION: Overall cognitive status:NT   VISION: Subjective report: reports hx of glaucoma VISION ASSESSMENT: Not tested  OBSERVATIONS: Pt is eager to improve, pt's husband is very supportive   TODAY'S TREATMENT:                                                                                                                              DATE: 12/24/22- Seated in w/c reviewed closed chain shoulder flexion and chest press then added shoulder abduction exercises 10 reps each, min v.c Pt transferred to mat scoot pivot with min guard assist and min v.c  Seated edge of mat shoulder flexion , biceps curls and chest press with 1 lb weight in each UE for increased strength and core control, min v.c Pt transitioned to supine with min A. Shoulder flexion and chest press with 3 lbs bar in supine, pt's L arm was noted to fatigues more quickly than right. Supine to sit  with min A. Pt transferred scoot pivot back to w/c with min A. Arm bike x 5 mins level 1 for conditioning. 1 rest break required.    12/17/22-Pt was instructed in coordination HEP, pt  demonstrates min difficulty with RUE with most exercises , mod difficulty with left for most exercises, however mod difficulty dealing cards with RUE, min v.c for performance. Shelf liner used for coins for increased ease. Cane exercises for chest press x 20 and shoulder flexion x 10 reps min v.c to avoid compensation.   PATIENT EDUCATION: See above for 12/24/22 HOME EXERCISE PROGRAM:  12/17/22- coordination HEP, cane exercises seated   GOALS: Goals reviewed with patient? Yes  SHORT TERM GOALS: Target date: 01/11/23  I with initial HEP Baseline: Goal status: INITIAL  2.  Pt will demonstrate improved UE functional use as evidenced increasing bilateral box/ blocks score by 4 blocks.  Baseline: R 35, L 26 Goal status: INITIAL  3.  Pt will donn pants with mod A. Baseline: max A Goal status: INITIAL  4. Pt will verbalize understanding of adapted strategies to maximize safety and I with ADLs/ IADLs .   Goal status: INITIAL  5.  Pt will demonstrate ability to stand for functional activity with min A x 10 mins in prep for ADLs.  Goal status: INITIAL  6.  Pt will perform w/c to [facility] transfers mod I  Goal status: INITIAL  LONG TERM GOALS: Target date: 03/06/23  I with updated HEP  Goal status: INITIAL  2.  Pt will perform LB dressing mod I. Baseline:max A Goal status: INITIAL  3.  Pt will bathe with supervision/ set up. Baseline: min-mod A for sponge bath Goal status: INITIAL  4.  Pt will perform basic cooking at a w/c level mod I  Goal status: INITIAL  5.  Pt will demonstrate improved fine motor coordination as evidenced by performing LUE 9 hole peg test in 2 mins or less.   Goal status: INITIAL  6.  Pt will demonstrate improved fine motor coordination as evidenced by performing RUE 9 hole peg test in 1 mins  30 secs or less.  Baseline: RUE  1 min 48 secs, LUE 4 pegs placed in 2 mins  Goal status: INITIAL  ASSESSMENT:  CLINICAL IMPRESSION: Pt is progressing  towards goals with improving transfers and UE strength.  PERFORMANCE DEFICITS: in functional skills including ADLs, IADLs, coordination, dexterity, sensation, ROM, strength, flexibility, Fine motor control, Gross motor control, mobility, balance, endurance, decreased knowledge of precautions, decreased knowledge of use of DME, and UE functional use, cognitive skills including  and psychosocial skills including coping strategies, environmental adaptation, habits, interpersonal interactions, and routines and behaviors.   IMPAIRMENTS: are limiting patient from ADLs, IADLs, rest and sleep, play, leisure, and social participation.   CO-MORBIDITIES: may have co-morbidities  that affects occupational performance. Patient will benefit from skilled OT to address above impairments and improve overall function.  MODIFICATION OR ASSISTANCE TO COMPLETE EVALUATION: No modification of tasks or assist necessary to complete an evaluation.  OT OCCUPATIONAL PROFILE AND HISTORY: Detailed assessment: Review of records and additional review of physical, cognitive, psychosocial history related to current functional performance.  CLINICAL DECISION MAKING: LOW - limited treatment options, no task modification necessary  REHAB POTENTIAL: Good  EVALUATION COMPLEXITY: Low    PLAN:  OT FREQUENCY: 2x/week  OT DURATION: 12 weeks  PLANNED INTERVENTIONS: self care/ADL training, therapeutic exercise, therapeutic activity, neuromuscular re-education, manual therapy, passive range of motion, gait training, balance training, stair training, functional mobility training, aquatic therapy, ultrasound, paraffin, moist heat, cryotherapy, patient/family education, energy conservation, coping strategies training, DME and/or AE instructions, and Re-evaluation  RECOMMENDED OTHER SERVICES: PT  CONSULTED AND AGREED WITH PLAN OF CARE: Patient and family member/caregiver  PLAN FOR NEXT SESSION:  fine motor coordination, UE strength  and endurance  Jerome Viglione, OT 12/30/2022, 9:47 AM

## 2022-12-30 NOTE — Therapy (Signed)
OUTPATIENT PHYSICAL THERAPY LOWER EXTREMITY TREATMENT   Patient Name: Dana [name] MRN: [id number] DOB:[birth year]CH[birth year] [name], female Today's Date: 12/31/2022  END OF SESSION:  PT End of Session - 12/31/22 1012     Visit Number 2    Date for PT Re-Evaluation 03/06/23    PT Start Time 1015    PT Stop Time 1100    PT Time Calculation (min) 45 min    Equipment Utilized During Treatment Gait belt              Past Medical History:  Diagnosis Date   Anxiety    Back pain with radiation    Breast mass, right    Chronic pain    Chronic, continuous use of opioids    Complication of anesthesia 04/07/14   Allergic reaction to Lisinopril immediately following surgery   DJD (degenerative joint disease)    Fibromyalgia    Groin abscess    Headache(784.0)    History of IBS    Hypercholesterolemia    IBS (irritable bowel syndrome)    Lactose intolerance    Mild hypertension    Obesity    Tobacco use disorder    Umbilical hernia    Symptomatic   Past Surgical History:  Procedure Laterality Date   ABDOMINAL HYSTERECTOMY     ANTERIOR CERVICAL DECOMP/DISCECTOMY FUSION N/A 12/20/2015   Procedure: ANTERIOR CERVICAL DECOMPRESSION FUSION CERVICAL 4-5, CERVICAL 5-6, CERVICAL 6-7 WITH INSTRUMENTATION AND ALLOGRAFT;  Surgeon: Estill Bamberg, MD;  Location: [facility] OR;  Service: Orthopedics;  Laterality: N/A;  Anterior cervical decompression fusion, cervical 4-5, cervical 5-6, cervical 6-7 with instrumentation and allograft   BACK SURGERY     BILATERAL SALPINGECTOMY  09/03/2012   Procedure: BILATERAL SALPINGECTOMY;  Surgeon: Ok Edwards, MD;  Location: [facility];  Service: Gynecology;  Laterality: Bilateral;   BREAST BIOPSY Right 04/07/2014   Procedure: REMOVAL RIGHT BREAST MASS WITH WIRE LOCALIZATION;  Surgeon: Adolph Pollack, MD;  Location: [facility] OR;  Service: General;  Laterality: Right;   BREAST EXCISIONAL BIOPSY Left    x2   BREAST LUMPECTOMY     x2   CARPAL TUNNEL RELEASE Left  05/11/2019   Procedure: LEFT CARPAL TUNNEL RELEASE, RIGHT TENNIS ELBOW MARCAINE/DEPO MEDROL INJECTION UNDER ANESTHESIA;  Surgeon: Kerrin Champagne, MD;  Location: [facility] OR;  Service: Orthopedics;  Laterality: Left;   COLONOSCOPY W/ BIOPSIES  04/24/2012   per Dr. Leone Payor, clear, repeat in 10 yrs    disectomy     ESOPHAGOGASTRODUODENOSCOPY     FINGER SURGERY     Right index-excision of mass    FOOT SURGERY Right    Bone Spurs   LAPAROSCOPIC HYSTERECTOMY  09/03/2012   Procedure: HYSTERECTOMY TOTAL LAPAROSCOPIC;  Surgeon: Ok Edwards, MD;  Location: [facility];  Service: Gynecology;  Laterality: N/A;   LUMBAR DISC SURGERY     TUBAL LIGATION     UMBILICAL HERNIA REPAIR N/A 05/01/2018   Procedure: UMBILICAL HERNIA REPAIR;  Surgeon: Jimmye Norman, MD;  Location: [facility] OR;  Service: General;  Laterality: N/A;   Patient Active Problem List   Diagnosis Date Noted   Wheelchair dependence 12/30/2022   Nerve pain 12/30/2022   Insomnia due to medical condition 12/30/2022   Guillain Barr syndrome 10/01/2022   UTI (urinary tract infection) 09/24/2022   Hypomagnesemia 09/24/2022   Debility 09/23/2022   Hypokalemia 09/23/2022   B12 deficiency 09/05/2022   Folate deficiency 09/05/2022   Carpal tunnel syndrome, left upper limb 05/11/2019    Class:  Chronic   Spinal stenosis of lumbar region with neurogenic claudication 08/20/2018   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   Primary osteoarthritis of right knee 02/27/2017   Right tennis elbow 11/11/2016   Muscle cramps 01/15/2016   Bilateral leg edema 01/08/2016   Radiculopathy 12/20/2015   Hyperglycemia 12/21/2014   Mastodynia, female 11/30/2014   S/P excision of fibroadenoma of breast 11/30/2014   Angioedema of lips 04/07/2014   Fibroadenoma of right breast 03/07/2014   Pelvic pain in female 06/19/2012   Menorrhagia 06/11/2012   SUI (stress urinary incontinence, female) 06/11/2012   HTN (hypertension) 06/11/2012   IBS (irritable bowel syndrome) -  diarrhea predominant 03/17/2012   MICROSCOPIC HEMATURIA 11/30/2010   BREAST PAIN, RIGHT 11/30/2010   BREAST MASS, RIGHT 01/12/2010   HYPERCHOLESTEROLEMIA 12/07/2007   CIGARETTE SMOKER 12/07/2007   DEGENERATIVE JOINT DISEASE 12/07/2007   Low back pain with sciatica 12/07/2007   HEADACHE 12/07/2007   Obesity 08/03/2007   Anxiety state 08/03/2007    PCP: Nelwyn Salisbury, MD  REFERRING PROVIDER: Nelwyn Salisbury, MD  REFERRING DIAG: G61.0 (ICD-10-CM) - Guillain Barr syndrome [facility])   THERAPY DIAG:  Reyes Ivan syndrome  Other lack of coordination  Muscle weakness (generalized)  Other abnormalities of gait and mobility  Unsteadiness on feet  Difficulty in walking, not elsewhere classified  Rationale for Evaluation and Treatment: Rehabilitation  ONSET DATE: 12/05/22  SUBJECTIVE:   SUBJECTIVE STATEMENT: I am okay, I am ready to get out this chair and start walking again.   OBJECTIVE STATEMENT: Dana [name] is a 57 y.o. female who presented to the [facility] ED on 09/23/2022 with bilateral lower extremity weakness with decreased mobility that has progressed to both arms. She also reported numbness and tingling of the extremities. She was transferred to [facility] for MRI. 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  Inpatient Rehab F/B HHPT.  R knee injection 10/08/22 trigger point injections for worsening back pain 2/15  RA  PAIN:  Are you having pain? Yes: NPRS scale: up to 10/10 Pain location: all over, but her R knee pain is worst,  Pain description: Sharp Aggravating factors: Has difficulty standing up Relieving factors: Medicine helps somewhat  PRECAUTIONS: Fall  WEIGHT BEARING RESTRICTIONS: No  FALLS:  Has patient fallen in last 6 months? No  LIVING ENVIRONMENT: Lives with: lives with their family Lives in: House/apartment Stairs: [place] a ramp Has following equipment at home: Environmental consultant - 2 wheeled, Wheelchair (power), Tour manager, bed side  commode, Ramped entry, and [employer] lift  OCCUPATION: on disability due to RA and a fall several years ago  PLOF: Independent  PATIENT GOALS: walk  NEXT MD VISIT: She is scheduled to follow up with Dr. Serita Sheller with Physical Med/Rehab on 12-30-22. She is scheduled to follow up with Dr. Levert Feinstein at Neurology on 01-28-23.   OBJECTIVE:   DIAGNOSTIC FINDINGS:  Right knee radiographs 11/09/2020   FINDINGS: Severe patellofemoral joint space narrowing. Severe superior and mild inferior patellar degenerative osteophytosis. Moderate superior trochlear degenerative osteophytosis. No joint effusion. Severe medial compartment joint space narrowing with moderate peripheral medial and lateral compartment degenerative osteophytes.  COGNITION: Overall cognitive status: Within functional limits for tasks assessed     SENSATION: Light touch: Impaired  and B hands and feet and lower legs  EDEMA:  BLE edema noted.  MUSCLE LENGTH: Hamstrings: WFL Thomas test: B tightness, to neutral  POSTURE: rounded shoulders and flexed trunk   PALPATION: No TTP  LOWER EXTREMITY ROM: BLE  ROM limited in all planes an all joints due to body habitus   LOWER EXTREMITY MMT:  MMT Right eval Left eval  Hip flexion 3 3-  Hip extension 3- 3-  Hip abduction 3 3-  Hip adduction    Hip internal rotation    Hip external rotation    Knee flexion 4 4-  Knee extension 4- 4-  Ankle dorsiflexion 4- 4-  Ankle plantarflexion    Ankle inversion 4- 4-  Ankle eversion 4- 4-   (Blank rows = not tested)  FUNCTIONAL TESTS:  5 times sit to stand: unable to complete Timed up and go (TUG): unable to complete  Bed Mobility: Occasional min A for sup <> sit, rolled B on mat with great effort, light min A  TRANSFERS: Scooting transfer with CGA, stand pivot with CGA, she reports that she sometimes has difficulty rising due to her knee pain  GAIT: Distance walked: 0' Assistive device utilized:  shopping cart-will bring  her RW from home. Level of assistance: Min A Comments: Stood and weight shifted, then stood again and took steps to turn to W/C.   TODAY'S TREATMENT:                                                                                                                              DATE:  12/31/22 LAQ 2x10 Seated marching 20 reps  Seated heel to toe raises 2x10 HS curls 2x10 red  Ball squeezes 2x10  Hip abd with band green 2x10  Mini squats in // bars x5 1 step forward and back in // bars   12/12/22  Education  PATIENT EDUCATION:  Education details: POC Person educated: Patient Education method: Explanation Education comprehension: verbalized understanding  HOME EXERCISE PROGRAM: TBD  ASSESSMENT:  CLINICAL IMPRESSION: Patient returns with some pain in knees. We focused on mostly seated exercises. Difficulty with marching on the L side. Ended session in // bars; she is able to complete some STS and was even able to take one step forward and back with UE support.   OBJECTIVE IMPAIRMENTS: Abnormal gait, decreased activity tolerance, decreased balance, decreased coordination, decreased endurance, difficulty walking, decreased ROM, decreased strength, and postural dysfunction.   ACTIVITY LIMITATIONS: carrying, lifting, bending, standing, squatting, stairs, transfers, and locomotion level  PARTICIPATION LIMITATIONS: meal prep, cleaning, laundry, driving, and shopping  PERSONAL FACTORS: Fitness, Past/current experiences, and 1 comorbidity: Recently diagnosed with GBS  are also affecting patient's functional outcome.   REHAB POTENTIAL: Good  CLINICAL DECISION MAKING: Evolving/moderate complexity  EVALUATION COMPLEXITY: Moderate   GOALS: Goals reviewed with patient? Yes  SHORT TERM GOALS: Target date: 11/28/22 I with initial HEP Baseline: Goal status: INITIAL  LONG TERM GOALS: Target date: 03/06/23  I with final HEP Baseline:  Goal status: INITIAL  2.  Decrease 5x STS to <  14 sec Baseline: unable to complete Goal status: INITIAL  3.  Decrease TUG to < 20 sec Baseline: Unable to complete. Goal  status: INITIAL  4.  Patient will ambulate at least 300' with LRAD, MI, on level and unlevel surfaces. Baseline: Stand pivot transfer with CGA Goal status: INITIAL  5.  Perform all bed mobility with I Baseline: Occ min A on mat Goal status: INITIAL  6.  Increase BLE strength to at least 4/5 throughout. Baseline: (3-)-3/5 Goal status: INITIAL   PLAN:  PT FREQUENCY: 1-2x/week  PT DURATION: 12 weeks  PLANNED INTERVENTIONS: Therapeutic exercises, Therapeutic activity, Neuromuscular re-education, Balance training, Gait training, Patient/Family education, Self Care, Joint mobilization, Stair training, Dry Needling, Electrical stimulation, Cryotherapy, Moist heat, Ultrasound, Ionotophoresis /ml Dexamethasone, and Manual therapy  PLAN FOR NEXT SESSION: Initiate HEP, standing, gait.   Iona Beard, DPT 12/31/2022, 10:59 AM

## 2022-12-30 NOTE — Progress Notes (Signed)
Subjective:    Patient ID: Dana [name], female    DOB: 29-Mar-1966, 57 y.o.   MRN: [id number]  HPI Ppt is a 57 yr old female with recent hx of GBS in 2/24- anxiety and depression, urinary retention improved; HTN, Vit B12 deficicncy- Glaucoma and R knee buckling and pain-  Here for hospital f/u- for GBS  In power w/c-  "Making it".  Doing therapy- initially did H/H- for 30 days- but moved to outpt PT and OT- 2 weeks ago- only has 1 visit PT so far-  Starting Tuesday and Thursday.   Not walking yet-  Transferring with Stedy at home Going well.  Also working on standing at therapy- but no walking yet.   Got new w/c this AM- racing red power w/c- Vision Ultra- but dont see brand.   Bowels- getting constipated- but is taking stool softener-  Eating a lot of fruit.   Gets Dilaudid from PCP  Dr Clent Ridges. 4 mg q6 hours prn     Pain Inventory Average Pain 9 Pain Right Now 4 My pain is constant, sharp, burning, dull, stabbing, tingling, and aching  LOCATION OF PAIN  All over the body in joints & all muscles  BOWEL Number of stools per week: 3 Oral laxative use Yes  Type of laxative Senokot Enema or suppository use No    BLADDER Normal   Mobility ability to climb steps?  no do you drive?  no use a wheelchair transfers alone Do you have any goals in this area?  yes  Function disabled: date disabled 2016 I need assistance with the following:  dressing, bathing, toileting, meal prep, household duties, and shopping Do you have any goals in this area?  yes  Neuro/Psych weakness numbness tingling trouble walking depression anxiety  Prior Studies Any changes since last visit?  no  Physicians involved in your care Any changes since last visit?  no   Family History  Problem Relation Age of Onset   Diabetes Father    Diabetes Mother    Prostate cancer Paternal Grandfather    Breast cancer Maternal Aunt 31   Social History   Socioeconomic History    Marital status: Married    Spouse name: Freeda Spivey   Number of children: 4   Years of education: Not on file   Highest education level: Not on file  Occupational History   Occupation: Psychologist, educational  Tobacco Use   Smoking status: Former    Years: 33    Types: Cigarettes   Smokeless tobacco: Never   Tobacco comments:    quit March 2017  Vaping Use   Vaping Use: Never used  Substance and Sexual Activity   Alcohol use: Not Currently    Alcohol/week: 0.0 standard drinks of alcohol    Comment: occ   Drug use: Yes    Comment: chronic use of dilaudid   Sexual activity: Yes    Birth control/protection: Surgical  Other Topics Concern   Not on file  Social History Narrative   Married - lives with husband 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, fashion/clothing    4 grown children does not live with her - 44, 67, 32 (twins)Grandchildren - 4Updated 10/12/2013   Social Determinants of Health   Financial Resource Strain: Low Risk  (05/06/2022)   Overall Financial Resource Strain (CARDIA)    Difficulty of Paying Living Expenses: Not hard at all  Food Insecurity: No Food Insecurity (05/06/2022)   Hunger Vital Sign    Worried About Running  Out of Food in the Last Year: Never true    Ran Out of Food in the Last Year: Never true  Transportation Needs: No Transportation Needs (05/06/2022)   PRAPARE - Administrator, Civil Service (Medical): No    Lack of Transportation (Non-Medical): No  Physical Activity: Inactive (05/06/2022)   Exercise Vital Sign    Days of Exercise per Week: 0 days    Minutes of Exercise per Session: 0 min  Stress: No Stress Concern Present (05/06/2022)   [employer] of Occupational Health - Occupational Stress Questionnaire    Feeling of Stress : Not at all  Social Connections: Not on file   Past Surgical History:  Procedure Laterality Date   ABDOMINAL HYSTERECTOMY     ANTERIOR CERVICAL DECOMP/DISCECTOMY FUSION N/A 12/20/2015   Procedure: ANTERIOR CERVICAL  DECOMPRESSION FUSION CERVICAL 4-5, CERVICAL 5-6, CERVICAL 6-7 WITH INSTRUMENTATION AND ALLOGRAFT;  Surgeon: Estill Bamberg, MD;  Location: [facility] OR;  Service: Orthopedics;  Laterality: N/A;  Anterior cervical decompression fusion, cervical 4-5, cervical 5-6, cervical 6-7 with instrumentation and allograft   BACK SURGERY     BILATERAL SALPINGECTOMY  09/03/2012   Procedure: BILATERAL SALPINGECTOMY;  Surgeon: Ok Edwards, MD;  Location: [facility];  Service: Gynecology;  Laterality: Bilateral;   BREAST BIOPSY Right 04/07/2014   Procedure: REMOVAL RIGHT BREAST MASS WITH WIRE LOCALIZATION;  Surgeon: Adolph Pollack, MD;  Location: [facility] OR;  Service: General;  Laterality: Right;   BREAST EXCISIONAL BIOPSY Left    x2   BREAST LUMPECTOMY     x2   CARPAL TUNNEL RELEASE Left 05/11/2019   Procedure: LEFT CARPAL TUNNEL RELEASE, RIGHT TENNIS ELBOW MARCAINE/DEPO MEDROL INJECTION UNDER ANESTHESIA;  Surgeon: Kerrin Champagne, MD;  Location: [facility] OR;  Service: Orthopedics;  Laterality: Left;   COLONOSCOPY W/ BIOPSIES  04/24/2012   per Dr. Leone Payor, clear, repeat in 10 yrs    disectomy     ESOPHAGOGASTRODUODENOSCOPY     FINGER SURGERY     Right index-excision of mass    FOOT SURGERY Right    Bone Spurs   LAPAROSCOPIC HYSTERECTOMY  09/03/2012   Procedure: HYSTERECTOMY TOTAL LAPAROSCOPIC;  Surgeon: Ok Edwards, MD;  Location: [facility];  Service: Gynecology;  Laterality: N/A;   LUMBAR DISC SURGERY     TUBAL LIGATION     UMBILICAL HERNIA REPAIR N/A 05/01/2018   Procedure: UMBILICAL HERNIA REPAIR;  Surgeon: Jimmye Norman, MD;  Location: [facility] OR;  Service: General;  Laterality: N/A;   Past Medical History:  Diagnosis Date   Anxiety    Back pain with radiation    Breast mass, right    Chronic pain    Chronic, continuous use of opioids    Complication of anesthesia 04/07/14   Allergic reaction to Lisinopril immediately following surgery   DJD (degenerative joint disease)    Fibromyalgia    Groin abscess     Headache(784.0)    History of IBS    Hypercholesterolemia    IBS (irritable bowel syndrome)    Lactose intolerance    Mild hypertension    Obesity    Tobacco use disorder    Umbilical hernia    Symptomatic   BP 137/86   Pulse 78   Ht 5\' 4"  (1.626 m)   LMP 08/17/2012   SpO2 98%   BMI 48.82 kg/m   Opioid Risk Score:   Fall Risk Score:  `1  Depression screen [facility] 2/9     12/30/2022   11:39 AM  12/05/2022   11:48 AM 09/05/2022   12:09 PM 05/06/2022    2:52 PM 05/01/2022    3:51 PM 12/31/2021    1:00 PM 09/14/2021    8:59 AM  Depression screen PHQ 2/9  Decreased Interest 0 0 0 0 3 0 0  Down, Depressed, Hopeless 1 0 0 0 0 0 0  PHQ - 2 Score 1 0 0 0 3 0 0  Altered sleeping 2 0 0  0 0 0  Tired, decreased energy 1 0 0  2 0 0  Change in appetite 0 0 0  Feeling bad or failure about yourself  1 3 0  0 0 0  Trouble concentrating 1 0 0  0 0 0  Moving slowly or fidgety/restless 0 0 0  0 0 0  Suicidal thoughts 0 0 0  0 0 0  PHQ-9 Score 0 0  Difficult doing work/chores  Extremely dIfficult Not difficult at all   Not difficult at all     Review of Systems  Musculoskeletal:  Positive for gait problem.  Neurological:  Positive for weakness and numbness.       Tingling  Psychiatric/Behavioral:         Depression, Anxiety       Objective:   Physical Exam  Awake, alert, appropriate, in power w/c; accompanied by husband, NAD MS: Ue's 4+/5 throughout all muscles except FA 4/5 LE's- HF 3+/5, KE/KF 4/5; and DF/PF 5-/5 B/L  Neuro: Intact to light touch in Ue's and RLE, but a little numb /decreased to light touch in LLE      Assessment & Plan:   Vicente Masson is a 57 yr old female with recent hx of GBS in 2/24- anxiety and depression, urinary retention improved; HTN, Vit B12 deficicncy- Glaucoma and R knee buckling and pain-  Here for hospital f/u- for GBS  Doesn't sleep well- doesn't like hospital bed- let's try Trazodone 50-100 mg nightly- for sleep- can even try 75 mg  if need be. Is a scored tablet.   2. Con't pain meds via PCP.   3. Con't therapy outpt.   4. Con't Gabapentin 900 mg 3x/day for nerve pain- #270- 5 refills  5. Con't Flexeril/Cyclobenzaprine 10 mg 3x/day as needed for muscle spasms. 5 refills.   6. F/U in 3 months - see how she's doing.   I spent a total of  23  minutes on total care today- >50% coordination of care- due to  discussion of options for insomnia and other meds as detailed above- will call me when starts walking.

## 2022-12-30 NOTE — Patient Instructions (Signed)
Ppt is a 57 yr old female with recent hx of GBS in 2/24- anxiety and depression, urinary retention improved; HTN, Vit B12 deficicncy- Glaucoma and R knee buckling and pain-  Here for hospital f/u- for GBS  Doesn't sleep well- doesn't like hospital bed- let's try Trazodone 50-100 mg nightly- for sleep- can even try 75 mg if need be. Is a scored tablet.   2. Con't pain meds via PCP.   3. Con't therapy outpt.   4. Con't Gabapentin 900 mg 3x/day for nerve pain- #270- 5 refills  5. Con't Flexeril/Cyclobenzaprine 10 mg 3x/day as needed for muscle spasms. 5 refills.   6. F/U in 3 months - see how she's doing.

## 2022-12-31 ENCOUNTER — Ambulatory Visit: Payer: 59 | Admitting: Occupational Therapy

## 2022-12-31 ENCOUNTER — Ambulatory Visit: Payer: 59

## 2022-12-31 DIAGNOSIS — R2681 Unsteadiness on feet: Secondary | ICD-10-CM

## 2022-12-31 DIAGNOSIS — G610 Guillain-Barre syndrome: Secondary | ICD-10-CM

## 2022-12-31 DIAGNOSIS — M6281 Muscle weakness (generalized): Secondary | ICD-10-CM

## 2022-12-31 DIAGNOSIS — R278 Other lack of coordination: Secondary | ICD-10-CM

## 2022-12-31 DIAGNOSIS — R2689 Other abnormalities of gait and mobility: Secondary | ICD-10-CM

## 2022-12-31 DIAGNOSIS — R262 Difficulty in walking, not elsewhere classified: Secondary | ICD-10-CM

## 2023-01-02 ENCOUNTER — Encounter: Payer: Self-pay | Admitting: Physical Therapy

## 2023-01-02 ENCOUNTER — Ambulatory Visit: Payer: 59 | Admitting: Physical Therapy

## 2023-01-02 ENCOUNTER — Ambulatory Visit: Payer: 59 | Admitting: Occupational Therapy

## 2023-01-02 DIAGNOSIS — R2681 Unsteadiness on feet: Secondary | ICD-10-CM

## 2023-01-02 DIAGNOSIS — M6281 Muscle weakness (generalized): Secondary | ICD-10-CM

## 2023-01-02 DIAGNOSIS — R278 Other lack of coordination: Secondary | ICD-10-CM

## 2023-01-02 DIAGNOSIS — R2689 Other abnormalities of gait and mobility: Secondary | ICD-10-CM

## 2023-01-02 DIAGNOSIS — R262 Difficulty in walking, not elsewhere classified: Secondary | ICD-10-CM

## 2023-01-02 NOTE — Patient Instructions (Addendum)
       Resisted Horizontal Abduction: Bilateral   Sit or stand, tubing in both hands, arms out in front. Keeping arms straight, pinch shoulder blades together and stretch arms out. Repeat _10___ times per set. Do _1-2___ sessions per day, every other day.   Elbow Flexion: Resisted   With tubing held in __one____ hand(s) and other end secured under foot, curl arm up as far as possible. Repeat _10___ times per set. Do _1-2___ sessions per day, every other day.    Elbow Extension: Resisted   Sit in chair with resistive band secured at armrest (or hold with other hand) and ____oneArm: Rowing    Sit facing tubing at chest level, arms outstretched. Pull arms back to row, keep arms low Copyright  VHI. All rights reserved.  ___ elbow bent. Straighten elbow. Repeat _10___ times per set.  Do _1-2___ sessions per day, every other day.   Copyright  VHI. All rights reserved.       Strengthening: Resisted Extension    Sit Hold tubing in right hand, arm forward. Pull arm back, elbow straight. Repeat _10___ times per set. Do __1__ sets per session. Do ___1_ sessions per day.  http://orth.exer.us/833   Copyright  VHI. All rights reserved.

## 2023-01-02 NOTE — Therapy (Signed)
OUTPATIENT OCCUPATIONAL THERAPY NEURO Treatment  Patient Name: Dana [name] MRN: [id number] DOB:1966-01-23, 57 y.o., female Today's Date: 01/02/2023  PCP: Dr. Clent Ridges REFERRING PROVIDER: Dr. Gershon Crane  END OF SESSION:  OT End of Session - 01/02/23 0900     Visit Number 5    Number of Visits 25    Date for OT Re-Evaluation 03/06/23    Authorization Type UHC, Medicare    Authorization - Visit Number 5    Progress Note Due on Visit 10    OT Start Time [phone]    OT Stop Time 1013    OT Time Calculation (min) 42 min    Activity Tolerance Patient tolerated treatment well    Behavior During Therapy [facility] for tasks assessed/performed               Past Medical History:  Diagnosis Date   Anxiety    Back pain with radiation    Breast mass, right    Chronic pain    Chronic, continuous use of opioids    Complication of anesthesia 04/07/14   Allergic reaction to Lisinopril immediately following surgery   DJD (degenerative joint disease)    Fibromyalgia    Groin abscess    Headache(784.0)    History of IBS    Hypercholesterolemia    IBS (irritable bowel syndrome)    Lactose intolerance    Mild hypertension    Obesity    Tobacco use disorder    Umbilical hernia    Symptomatic   Past Surgical History:  Procedure Laterality Date   ABDOMINAL HYSTERECTOMY     ANTERIOR CERVICAL DECOMP/DISCECTOMY FUSION N/A 12/20/2015   Procedure: ANTERIOR CERVICAL DECOMPRESSION FUSION CERVICAL 4-5, CERVICAL 5-6, CERVICAL 6-7 WITH INSTRUMENTATION AND ALLOGRAFT;  Surgeon: Estill Bamberg, MD;  Location: [facility] OR;  Service: Orthopedics;  Laterality: N/A;  Anterior cervical decompression fusion, cervical 4-5, cervical 5-6, cervical 6-7 with instrumentation and allograft   BACK SURGERY     BILATERAL SALPINGECTOMY  09/03/2012   Procedure: BILATERAL SALPINGECTOMY;  Surgeon: Ok Edwards, MD;  Location: [facility];  Service: Gynecology;  Laterality: Bilateral;   BREAST BIOPSY Right 04/07/2014   Procedure:  REMOVAL RIGHT BREAST MASS WITH WIRE LOCALIZATION;  Surgeon: Adolph Pollack, MD;  Location: [facility] OR;  Service: General;  Laterality: Right;   BREAST EXCISIONAL BIOPSY Left    x2   BREAST LUMPECTOMY     x2   CARPAL TUNNEL RELEASE Left 05/11/2019   Procedure: LEFT CARPAL TUNNEL RELEASE, RIGHT TENNIS ELBOW MARCAINE/DEPO MEDROL INJECTION UNDER ANESTHESIA;  Surgeon: Kerrin Champagne, MD;  Location: [facility] OR;  Service: Orthopedics;  Laterality: Left;   COLONOSCOPY W/ BIOPSIES  04/24/2012   per Dr. Leone Payor, clear, repeat in 10 yrs    disectomy     ESOPHAGOGASTRODUODENOSCOPY     FINGER SURGERY     Right index-excision of mass    FOOT SURGERY Right    Bone Spurs   LAPAROSCOPIC HYSTERECTOMY  09/03/2012   Procedure: HYSTERECTOMY TOTAL LAPAROSCOPIC;  Surgeon: Ok Edwards, MD;  Location: [facility];  Service: Gynecology;  Laterality: N/A;   LUMBAR DISC SURGERY     TUBAL LIGATION     UMBILICAL HERNIA REPAIR N/A 05/01/2018   Procedure: UMBILICAL HERNIA REPAIR;  Surgeon: Jimmye Norman, MD;  Location: [facility] OR;  Service: General;  Laterality: N/A;   Patient Active Problem List   Diagnosis Date Noted   Wheelchair dependence 12/30/2022   Nerve pain 12/30/2022   Insomnia due to medical condition  12/30/2022   Guillain Barr syndrome 10/01/2022   UTI (urinary tract infection) 09/24/2022   Hypomagnesemia 09/24/2022   Debility 09/23/2022   Hypokalemia 09/23/2022   B12 deficiency 09/05/2022   Folate deficiency 09/05/2022   Carpal tunnel syndrome, left upper limb 05/11/2019    Class: Chronic   Spinal stenosis of lumbar region with neurogenic claudication 08/20/2018   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   Primary osteoarthritis of right knee 02/27/2017   Right tennis elbow 11/11/2016   Muscle cramps 01/15/2016   Bilateral leg edema 01/08/2016   Radiculopathy 12/20/2015   Hyperglycemia 12/21/2014   Mastodynia, female 11/30/2014   S/P excision of fibroadenoma of breast 11/30/2014   Angioedema of lips  04/07/2014   Fibroadenoma of right breast 03/07/2014   Pelvic pain in female 06/19/2012   Menorrhagia 06/11/2012   SUI (stress urinary incontinence, female) 06/11/2012   HTN (hypertension) 06/11/2012   IBS (irritable bowel syndrome) - diarrhea predominant 03/17/2012   MICROSCOPIC HEMATURIA 11/30/2010   BREAST PAIN, RIGHT 11/30/2010   BREAST MASS, RIGHT 01/12/2010   HYPERCHOLESTEROLEMIA 12/07/2007   CIGARETTE SMOKER 12/07/2007   DEGENERATIVE JOINT DISEASE 12/07/2007   Low back pain with sciatica 12/07/2007   HEADACHE 12/07/2007   Obesity 08/03/2007   Anxiety state 08/03/2007    ONSET DATE: 12/05/22- referral date  REFERRING DIAG: G61.0 (ICD-10-CM) - Guillain Barr syndrome (HCC)  THERAPY DIAG:    Rationale for Evaluation and Treatment: Rehabilitation  SUBJECTIVE:   SUBJECTIVE STATEMENT: Denies pain, reports she wants to be able to go out to eat  PERTINENT HISTORY: Pt is a 57 y/o female hospitalized 09/23/22  with progressive weakness and sensation changes, consistent with Guillain-Barr syndrome. MRI negative. LP results and assessment most consistent with Guillain-Barr syndrome.  Pt was d/c home from CIR 11/06/22 PMH includes: glaucoma anxiety, DJD, fibromyalgia, HTN, tobacco use, B-12 deficiency, OA, ACDF 2017. Home health OT/PT wrapped up last week.  PRECAUTIONS: Fall  WEIGHT BEARING RESTRICTIONS: no  PAIN: no  FALLS: Has patient fallen in last 6 months? No  LIVING ENVIRONMENT: Lives with: lives with their spouse Lives in: House/apartment Stairs: has ramp built Has following equipment at home:   BSC, steadi,  tub bench but can't access bathroom   PLOF: Independent  PATIENT GOALS: increase independence   OBJECTIVE:   HAND DOMINANCE: Right  ADLs: Overall ADLs: assistance required, unable to access BR Transfers/ambulation related to ADLs: Eating: unable to cut food Grooming: set up UB Dressing: set up for bra and shirt LB Dressing: max A Toileting: uses  BSC uses steadi Bathing: min-mod A in hospital  bed  Tub Shower transfers: n/a Equipment: Emergency planning/management officer  IADLs:dependent for IADLS   MOBILITY STATUS:  uses w/c  , per PT report minguard for transfer from w/c to mat    ACTIVITY TOLERANCE: Activity tolerance: Pt fatigues quickly    UPPER EXTREMITY ROM:    Active ROM Right eval Left eval  Shoulder flexion 120 100  Shoulder abduction 90 90  Shoulder adduction    Shoulder extension    Shoulder internal rotation    Shoulder external rotation    Elbow flexion    Elbow extension  -25  Wrist flexion    Wrist extension    Wrist ulnar deviation    Wrist radial deviation    Wrist pronation    Wrist supination    (Blank rows = not tested)  UPPER EXTREMITY MMT:     MMT Right eval Left eval  Shoulder flexion 3+/5 3-/5  Shoulder abduction    Shoulder adduction    Shoulder extension    Shoulder internal rotation    Shoulder external rotation    Middle trapezius    Lower trapezius    Elbow flexion 3+/5 3/5  Elbow extension 3/5 3/5  Wrist flexion    Wrist extension    Wrist ulnar deviation    Wrist radial deviation    Wrist pronation    Wrist supination    (Blank rows = not tested)  HAND FUNCTION: Grip strength: Right: 32 lbs; Left: 18 lbs  COORDINATION: 9 Hole Peg test: Right: 1 min 48 sec; Left: 4 pegs in 2 mins  Box and Blocks:  Right 35 blocks, Left  26 blocks  SENSATION: Light touch: WFL Hot/Cold: Not tested    COGNITION: Overall cognitive status:NT   VISION: Subjective report: reports hx of glaucoma VISION ASSESSMENT: Not tested  OBSERVATIONS: Pt is eager to improve, pt's husband is very supportive   TODAY'S TREATMENT:                                                                                                                              DATE: 01/02/23- Arm bike x 6 mins level 1 for conditioning Pt was educated in red theraband HEP for UE strengthening, 15 reps each, min v.c and  demonstration Gripper set at level 1 for sustained grip to pick up 1 inch blocks, mod difficulty, min v.c several drops  Then gripper at Level 2  to pick up inch blocks min difficulty for sustained grip Placing yellow- blue graded clothespins on vertical target with LUE, mod difficulty/ v.c Then placing black clothespins with RUE and removing all clothespins from target 1-8# of pinch with RUE.    12/31/22- placing and removing wooden dowel pegs with LUE, pt was able to perform with good accuracy, no drops. Pt still has near absent sensation in bilateral UE's. Pt reports frying french fries at home and she did ok. Pt was cautioned due to absent sensation that she is at increased risk for burns. Therapist recommends pt performs cold sandwich/ snack prep initially. Functional reaching with left and right UE's to place and remove graded clothespins from vertical antennae, for sustained pinch, mod-max difficulty with LUE, min-mod difficulty with RUE 1-8# pinch. Folding towels with bilateral UE's with good control. Pt was encouraged to fold laundry at home. Pt reports she has yellow putty at home. Pt was encouraged to continue working on grip and sustained pinch exercises with her putty.   12/24/22- Seated in w/c reviewed closed chain shoulder flexion and chest press then added shoulder abduction exercises 10 reps each, min v.c Pt transferred to mat scoot pivot with min guard assist and min v.c  Seated edge of mat shoulder flexion , biceps curls and chest press with 1 lb weight in each UE for increased strength and core control, min v.c Pt transitioned to supine with min A. Shoulder flexion and chest press  with 3 lbs bar in supine, pt's L arm was noted to fatigues more quickly than right. Supine to sit  with min A. Pt transferred scoot pivot back to w/c with min A. Arm bike x 5 mins level 1 for conditioning. 1 rest break required.    12/17/22-Pt was instructed in coordination HEP, pt demonstrates min  difficulty with RUE with most exercises , mod difficulty with left for most exercises, however mod difficulty dealing cards with RUE, min v.c for performance. Shelf liner used for coins for increased ease. Cane exercises for chest press x 20 and shoulder flexion x 10 reps min v.c to avoid compensation.   PATIENT EDUCATION: Education details: red theraband HEP see pt instructions Person educated: Patient Education method: Explanation, Demonstration, Verbal cues, and Handouts Education comprehension: verbalized understanding, returned demonstration, and verbal cues required   HOME EXERCISE PROGRAM:  12/17/22- coordination HEP, cane exercises seated   GOALS: Goals reviewed with patient? Yes  SHORT TERM GOALS: Target date: 01/11/23  I with initial HEP Baseline: Goal status: ongoing 12/31/22  2.  Pt will demonstrate improved UE functional use as evidenced increasing bilateral box/ blocks score by 4 blocks.  Baseline: R 35, L 26 Goal status  met 12/31/22 RUE :43:  LUE 35    3.  Pt will donn pants with mod A. Baseline: max A Goal status: ongoing  4. Pt will verbalize understanding of adapted strategies to maximize safety and I with ADLs/ IADLs .   Goal status:  ongoing  5.  Pt will demonstrate ability to stand for functional activity with min A x 10 mins in prep for ADLs.  Goal status: ongoing  6.  Pt will perform w/c to [facility] transfers mod I  Goal status:  ongoing 01/02/23  LONG TERM GOALS: Target date: 03/06/23  I with updated HEP  Goal status: INITIAL  2.  Pt will perform LB dressing mod I. Baseline:max A Goal status: INITIAL  3.  Pt will bathe with supervision/ set up. Baseline: min-mod A for sponge bath Goal status: INITIAL  4.  Pt will perform basic cooking at a w/c level mod I  Goal status: ongoing pt reports cooking french fries mod I. Pt was cautioned at this time due to   5.  Pt will demonstrate improved fine motor coordination as evidenced by performing LUE  9 hole peg test in 2 mins or less.   Goal status: INITIAL  6.  Pt will demonstrate improved fine motor coordination as evidenced by performing RUE 9 hole peg test in 1 mins  30 secs or less.  Baseline: RUE  1 min 48 secs, LUE 4 pegs placed in 2 mins  Goal status: INITIAL  ASSESSMENT:  CLINICAL IMPRESSION: Pt is progressing towards goals. She demonstrates improving strength and  understanding of theraband HEP.  PERFORMANCE DEFICITS: in functional skills including ADLs, IADLs, coordination, dexterity, sensation, ROM, strength, flexibility, Fine motor control, Gross motor control, mobility, balance, endurance, decreased knowledge of precautions, decreased knowledge of use of DME, and UE functional use, cognitive skills including  and psychosocial skills including coping strategies, environmental adaptation, habits, interpersonal interactions, and routines and behaviors.   IMPAIRMENTS: are limiting patient from ADLs, IADLs, rest and sleep, play, leisure, and social participation.   CO-MORBIDITIES: may have co-morbidities  that affects occupational performance. Patient will benefit from skilled OT to address above impairments and improve overall function.  MODIFICATION OR ASSISTANCE TO COMPLETE EVALUATION: No modification of tasks or assist necessary to complete an evaluation.  OT OCCUPATIONAL PROFILE AND HISTORY: Detailed assessment: Review of records and additional review of physical, cognitive, psychosocial history related to current functional performance.  CLINICAL DECISION MAKING: LOW - limited treatment options, no task modification necessary  REHAB POTENTIAL: Good  EVALUATION COMPLEXITY: Low    PLAN:  OT FREQUENCY: 2x/week  OT DURATION: 12 weeks  PLANNED INTERVENTIONS: self care/ADL training, therapeutic exercise, therapeutic activity, neuromuscular re-education, manual therapy, passive range of motion, gait training, balance training, stair training, functional mobility  training, aquatic therapy, ultrasound, paraffin, moist heat, cryotherapy, patient/family education, energy conservation, coping strategies training, DME and/or AE instructions, and Re-evaluation  RECOMMENDED OTHER SERVICES: PT  CONSULTED AND AGREED WITH PLAN OF CARE: Patient and family member/caregiver  PLAN FOR NEXT SESSION:  ADL strategies, fine motor coordination, UE strength and endurance  Jacalyn Biggs, OT 01/02/2023, 9:11 AM

## 2023-01-02 NOTE — Therapy (Signed)
OUTPATIENT PHYSICAL THERAPY LOWER EXTREMITY TREATMENT   Patient Name: Dana [name] MRN: [id number] DOB:May 07, 1966, 57 y.o., female Today's Date: 01/02/2023  END OF SESSION:  PT End of Session - 01/02/23 0853     Visit Number 3    Date for PT Re-Evaluation 03/06/23    PT Start Time 0847    PT Stop Time 0926    PT Time Calculation (min) 39 min    Equipment Utilized During Treatment Gait belt    Activity Tolerance Patient tolerated treatment well    Behavior During Therapy [facility] for tasks assessed/performed              Past Medical History:  Diagnosis Date   Anxiety    Back pain with radiation    Breast mass, right    Chronic pain    Chronic, continuous use of opioids    Complication of anesthesia 04/07/14   Allergic reaction to Lisinopril immediately following surgery   DJD (degenerative joint disease)    Fibromyalgia    Groin abscess    Headache(784.0)    History of IBS    Hypercholesterolemia    IBS (irritable bowel syndrome)    Lactose intolerance    Mild hypertension    Obesity    Tobacco use disorder    Umbilical hernia    Symptomatic   Past Surgical History:  Procedure Laterality Date   ABDOMINAL HYSTERECTOMY     ANTERIOR CERVICAL DECOMP/DISCECTOMY FUSION N/A 12/20/2015   Procedure: ANTERIOR CERVICAL DECOMPRESSION FUSION CERVICAL 4-5, CERVICAL 5-6, CERVICAL 6-7 WITH INSTRUMENTATION AND ALLOGRAFT;  Surgeon: Dana Bamberg, MD;  Location: [facility] OR;  Service: Orthopedics;  Laterality: N/A;  Anterior cervical decompression fusion, cervical 4-5, cervical 5-6, cervical 6-7 with instrumentation and allograft   BACK SURGERY     BILATERAL SALPINGECTOMY  09/03/2012   Procedure: BILATERAL SALPINGECTOMY;  Surgeon: Dana Edwards, MD;  Location: [facility] ORS;  Service: Gynecology;  Laterality: Bilateral;   BREAST BIOPSY Right 04/07/2014   Procedure: REMOVAL RIGHT BREAST MASS WITH WIRE LOCALIZATION;  Surgeon: Dana Pollack, MD;  Location: [facility] OR;  Service: General;   Laterality: Right;   BREAST EXCISIONAL BIOPSY Left    x2   BREAST LUMPECTOMY     x2   CARPAL TUNNEL RELEASE Left 05/11/2019   Procedure: LEFT CARPAL TUNNEL RELEASE, RIGHT TENNIS ELBOW MARCAINE/DEPO MEDROL INJECTION UNDER ANESTHESIA;  Surgeon: Dana Champagne, MD;  Location: [facility] OR;  Service: Orthopedics;  Laterality: Left;   COLONOSCOPY W/ BIOPSIES  04/24/2012   per Dr. Leone [name], clear, repeat in 10 yrs    disectomy     ESOPHAGOGASTRODUODENOSCOPY     FINGER SURGERY     Right index-excision of mass    FOOT SURGERY Right    Bone Spurs   LAPAROSCOPIC HYSTERECTOMY  09/03/2012   Procedure: HYSTERECTOMY TOTAL LAPAROSCOPIC;  Surgeon: Dana Edwards, MD;  Location: [facility] ORS;  Service: Gynecology;  Laterality: N/A;   LUMBAR DISC SURGERY     TUBAL LIGATION     UMBILICAL HERNIA REPAIR N/A 05/01/2018   Procedure: UMBILICAL HERNIA REPAIR;  Surgeon: Dana Norman, MD;  Location: [facility] OR;  Service: General;  Laterality: N/A;   Patient Active Problem List   Diagnosis Date Noted   Wheelchair dependence 12/30/2022   Nerve pain 12/30/2022   Insomnia due to medical condition 12/30/2022   Guillain Barr syndrome 10/01/2022   UTI (urinary tract infection) 09/24/2022   Hypomagnesemia 09/24/2022   Debility 09/23/2022   Hypokalemia 09/23/2022   B12 deficiency  09/05/2022   Folate deficiency 09/05/2022   Carpal tunnel syndrome, left upper limb 05/11/2019    Class: Chronic   Spinal stenosis of lumbar region with neurogenic claudication 08/20/2018   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   Primary osteoarthritis of right knee 02/27/2017   Right tennis elbow 11/11/2016   Muscle cramps 01/15/2016   Bilateral leg edema 01/08/2016   Radiculopathy 12/20/2015   Hyperglycemia 12/21/2014   Mastodynia, female 11/30/2014   S/P excision of fibroadenoma of breast 11/30/2014   Angioedema of lips 04/07/2014   Fibroadenoma of right breast 03/07/2014   Pelvic pain in female 06/19/2012   Menorrhagia 06/11/2012   SUI  (stress urinary incontinence, female) 06/11/2012   HTN (hypertension) 06/11/2012   IBS (irritable bowel syndrome) - diarrhea predominant 03/17/2012   MICROSCOPIC HEMATURIA 11/30/2010   BREAST PAIN, RIGHT 11/30/2010   BREAST MASS, RIGHT 01/12/2010   HYPERCHOLESTEROLEMIA 12/07/2007   CIGARETTE SMOKER 12/07/2007   DEGENERATIVE JOINT DISEASE 12/07/2007   Low back pain with sciatica 12/07/2007   HEADACHE 12/07/2007   Obesity 08/03/2007   Anxiety state 08/03/2007    PCP: Dana Salisbury, MD  REFERRING PROVIDER: Nelwyn Salisbury, MD  REFERRING DIAG: G61.0 (ICD-10-CM) - Guillain Barr syndrome [facility])   THERAPY DIAG:  Other lack of coordination  Muscle weakness (generalized)  Unsteadiness on feet  Difficulty in walking, not elsewhere classified  Rationale for Evaluation and Treatment: Rehabilitation  ONSET DATE: 12/05/22  SUBJECTIVE:   SUBJECTIVE STATEMENT: Patient reports no issues. She was a little sore after her last treatment, but no lasting pain.  OBJECTIVE STATEMENT: Dana [name] is a 57 y.o. female who [facility] to the [facility] ED on 09/23/2022 with bilateral lower extremity weakness with decreased mobility that has progressed to both arms. She also reported numbness and tingling of the extremities. She was transferred to [facility] for MRI. 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  Inpatient Rehab F/B HHPT.  R knee injection 10/08/22 trigger point injections for worsening back pain 2/15  RA  PAIN:  Are you having pain? Yes: NPRS scale: up to 10/10 Pain location: all over, but her R knee pain is worst,  Pain description: Sharp Aggravating factors: Has difficulty standing up Relieving factors: Medicine helps somewhat  PRECAUTIONS: Fall  WEIGHT BEARING RESTRICTIONS: No  FALLS:  Has patient fallen in last 6 months? No  LIVING ENVIRONMENT: Lives with: lives with their family Lives in: House/apartment Stairs: Nohas a ramp Has following equipment at  home: Environmental consultant - 2 wheeled, Wheelchair (power), Tour manager, bed side commode, Ramped entry, and [employer] lift  OCCUPATION: on disability due to RA and a fall several years ago  PLOF: Independent  PATIENT GOALS: walk  NEXT MD VISIT: She is scheduled to follow up with Dr. Serita Sheller with Physical Med/Rehab on 12-30-22. She is scheduled to follow up with Dr. Levert Feinstein at Neurology on 01-28-23.   OBJECTIVE:   DIAGNOSTIC FINDINGS:  Right knee radiographs 11/09/2020   FINDINGS: Severe patellofemoral joint space narrowing. Severe superior and mild inferior patellar degenerative osteophytosis. Moderate superior trochlear degenerative osteophytosis. No joint effusion. Severe medial compartment joint space narrowing with moderate peripheral medial and lateral compartment degenerative osteophytes.  COGNITION: Overall cognitive status: Within functional limits for tasks assessed     SENSATION: Light touch: Impaired  and B hands and feet and lower legs  EDEMA:  BLE edema noted.  MUSCLE LENGTH: Hamstrings: WFL Thomas test: B tightness, to neutral  POSTURE: rounded shoulders and flexed trunk  PALPATION: No TTP  LOWER EXTREMITY ROM: BLE ROM limited in all planes an all joints due to body habitus   LOWER EXTREMITY MMT:  MMT Right eval Left eval  Hip flexion 3 3-  Hip extension 3- 3-  Hip abduction 3 3-  Hip adduction    Hip internal rotation    Hip external rotation    Knee flexion 4 4-  Knee extension 4- 4-  Ankle dorsiflexion 4- 4-  Ankle plantarflexion    Ankle inversion 4- 4-  Ankle eversion 4- 4-   (Blank rows = not tested)  FUNCTIONAL TESTS:  5 times sit to stand: unable to complete Timed up and go (TUG): unable to complete  Bed Mobility: Occasional min A for sup <> sit, rolled B on mat with great effort, light min A  TRANSFERS: Scooting transfer with CGA, stand pivot with CGA, she reports that she sometimes has difficulty rising due to her knee  pain  GAIT: Distance walked: 0' Assistive device utilized:  shopping cart-will bring her RW from home. Level of assistance: Min A Comments: Stood and weight shifted, then stood again and took steps to turn to W/C.   TODAY'S TREATMENT:                                                                                                                              DATE:  01/02/23 Seated long kicks and hip flexion 2 x 10 reps each. Seated heel raise/toe raise 2 x 10 reps Facing mat with seat of W/C elevated, sit to stand, CGA, x 3 Scoot transfer to NuStep, S NuStep L3 x 4 minutes, then 20 sec at L5 with legs only Stand pivot transfer to W/C. Min A. Initiatlly fearful and unable ot step with LLE. Improved on second attempt, VC for upright posture which helped. Seated rotation to L, slide hands out to the side and back, 2 x 5 in each direction Stand pivot to W/C with RW and min A.  12/31/22 LAQ 2x10 Seated marching 20 reps  Seated heel to toe raises 2x10 HS curls 2x10 red  Ball squeezes 2x10  Hip abd with band green 2x10  Mini squats in // bars x5 1 step forward and back in // bars   12/12/22  Education  PATIENT EDUCATION:  Education details: POC Person educated: Patient Education method: Explanation Education comprehension: verbalized understanding  HOME EXERCISE PROGRAM: TBD  ASSESSMENT:  CLINICAL IMPRESSION: Patient reports some muscle soreness after last treatment. Progressed with BLE strengthening activities and standing/stepping in safe set up.  OBJECTIVE IMPAIRMENTS: Abnormal gait, decreased activity tolerance, decreased balance, decreased coordination, decreased endurance, difficulty walking, decreased ROM, decreased strength, and postural dysfunction.   ACTIVITY LIMITATIONS: carrying, lifting, bending, standing, squatting, stairs, transfers, and locomotion level  PARTICIPATION LIMITATIONS: meal prep, cleaning, laundry, driving, and shopping  PERSONAL FACTORS:  Fitness, Past/current experiences, and 1 comorbidity: Recently diagnosed with GBS  are also affecting patient's functional outcome.  REHAB POTENTIAL: Good  CLINICAL DECISION MAKING: Evolving/moderate complexity  EVALUATION COMPLEXITY: Moderate   GOALS: Goals reviewed with patient? Yes  SHORT TERM GOALS: Target date: 11/28/22 I with initial HEP Baseline: Goal status: INITIAL  LONG TERM GOALS: Target date: 03/06/23  I with final HEP Baseline:  Goal status: INITIAL  2.  Decrease 5x STS to < 14 sec Baseline: unable to complete Goal status: 01/02/23-Initiated strengthening  3.  Decrease TUG to < 20 sec Baseline: Unable to complete. Goal status: INITIAL  4.  Patient will ambulate at least 300' with LRAD, MI, on level and unlevel surfaces. Baseline: Stand pivot transfer with CGA Goal status: INITIAL  5.  Perform all bed mobility with I Baseline: Occ min A on mat Goal status: INITIAL  6.  Increase BLE strength to at least 4/5 throughout. Baseline: (3-)-3/5 Goal status: INITIAL  PLAN:  PT FREQUENCY: 1-2x/week  PT DURATION: 12 weeks  PLANNED INTERVENTIONS: Therapeutic exercises, Therapeutic activity, Neuromuscular re-education, Balance training, Gait training, Patient/Family education, Self Care, Joint mobilization, Stair training, Dry Needling, Electrical stimulation, Cryotherapy, Moist heat, Ultrasound, Ionotophoresis 4mg /ml Dexamethasone, and Manual therapy  PLAN FOR NEXT SESSION: Initiate HEP, standing, gait.  Iona Beard, DPT 01/02/2023, 9:27 AM

## 2023-01-07 ENCOUNTER — Ambulatory Visit: Payer: 59 | Admitting: Physical Therapy

## 2023-01-07 ENCOUNTER — Ambulatory Visit: Payer: 59 | Admitting: Occupational Therapy

## 2023-01-09 ENCOUNTER — Ambulatory Visit: Payer: 59 | Admitting: Physical Therapy

## 2023-01-09 ENCOUNTER — Encounter: Payer: Self-pay | Admitting: Physical Therapy

## 2023-01-09 ENCOUNTER — Ambulatory Visit: Payer: 59 | Admitting: Occupational Therapy

## 2023-01-09 DIAGNOSIS — R262 Difficulty in walking, not elsewhere classified: Secondary | ICD-10-CM

## 2023-01-09 DIAGNOSIS — M6281 Muscle weakness (generalized): Secondary | ICD-10-CM

## 2023-01-09 DIAGNOSIS — R278 Other lack of coordination: Secondary | ICD-10-CM | POA: Diagnosis not present

## 2023-01-09 DIAGNOSIS — R2681 Unsteadiness on feet: Secondary | ICD-10-CM

## 2023-01-09 DIAGNOSIS — R2689 Other abnormalities of gait and mobility: Secondary | ICD-10-CM

## 2023-01-09 DIAGNOSIS — G610 Guillain-Barre syndrome: Secondary | ICD-10-CM

## 2023-01-09 NOTE — Therapy (Signed)
OUTPATIENT OCCUPATIONAL THERAPY NEURO Treatment  Patient Name: Dana [name] MRN: [id number] DOB:08-19-1966, 57 y.o., female Today's Date: 01/09/2023  PCP: Dr. Clent Ridges REFERRING PROVIDER: Dr. Gershon Crane  END OF SESSION:  OT End of Session - 01/09/23 0850     Visit Number 6    Number of Visits 25    Date for OT Re-Evaluation 03/06/23    Authorization Type UHC, Medicare    Authorization - Visit Number 6    Progress Note Due on Visit 10    OT Start Time [phone]    OT Stop Time 0925    OT Time Calculation (min) 44 min    Activity Tolerance Patient tolerated treatment well    Behavior During Therapy [facility] for tasks assessed/performed               Past Medical History:  Diagnosis Date   Anxiety    Back pain with radiation    Breast mass, right    Chronic pain    Chronic, continuous use of opioids    Complication of anesthesia 04/07/14   Allergic reaction to Lisinopril immediately following surgery   DJD (degenerative joint disease)    Fibromyalgia    Groin abscess    Headache(784.0)    History of IBS    Hypercholesterolemia    IBS (irritable bowel syndrome)    Lactose intolerance    Mild hypertension    Obesity    Tobacco use disorder    Umbilical hernia    Symptomatic   Past Surgical History:  Procedure Laterality Date   ABDOMINAL HYSTERECTOMY     ANTERIOR CERVICAL DECOMP/DISCECTOMY FUSION N/A 12/20/2015   Procedure: ANTERIOR CERVICAL DECOMPRESSION FUSION CERVICAL 4-5, CERVICAL 5-6, CERVICAL 6-7 WITH INSTRUMENTATION AND ALLOGRAFT;  Surgeon: Estill Bamberg, MD;  Location: [facility] OR;  Service: Orthopedics;  Laterality: N/A;  Anterior cervical decompression fusion, cervical 4-5, cervical 5-6, cervical 6-7 with instrumentation and allograft   BACK SURGERY     BILATERAL SALPINGECTOMY  09/03/2012   Procedure: BILATERAL SALPINGECTOMY;  Surgeon: Ok Edwards, MD;  Location: [facility] ORS;  Service: Gynecology;  Laterality: Bilateral;   BREAST BIOPSY Right 04/07/2014   Procedure:  REMOVAL RIGHT BREAST MASS WITH WIRE LOCALIZATION;  Surgeon: Adolph Pollack, MD;  Location: [facility] OR;  Service: General;  Laterality: Right;   BREAST EXCISIONAL BIOPSY Left    x2   BREAST LUMPECTOMY     x2   CARPAL TUNNEL RELEASE Left 05/11/2019   Procedure: LEFT CARPAL TUNNEL RELEASE, RIGHT TENNIS ELBOW MARCAINE/DEPO MEDROL INJECTION UNDER ANESTHESIA;  Surgeon: Kerrin Champagne, MD;  Location: [facility] OR;  Service: Orthopedics;  Laterality: Left;   COLONOSCOPY W/ BIOPSIES  04/24/2012   per Dr. Leone Payor, clear, repeat in 10 yrs    disectomy     ESOPHAGOGASTRODUODENOSCOPY     FINGER SURGERY     Right index-excision of mass    FOOT SURGERY Right    Bone Spurs   LAPAROSCOPIC HYSTERECTOMY  09/03/2012   Procedure: HYSTERECTOMY TOTAL LAPAROSCOPIC;  Surgeon: Ok Edwards, MD;  Location: [facility] ORS;  Service: Gynecology;  Laterality: N/A;   LUMBAR DISC SURGERY     TUBAL LIGATION     UMBILICAL HERNIA REPAIR N/A 05/01/2018   Procedure: UMBILICAL HERNIA REPAIR;  Surgeon: Jimmye Norman, MD;  Location: [facility] OR;  Service: General;  Laterality: N/A;   Patient Active Problem List   Diagnosis Date Noted   Wheelchair dependence 12/30/2022   Nerve pain 12/30/2022   Insomnia due to medical condition  12/30/2022   Guillain Barr syndrome 10/01/2022   UTI (urinary tract infection) 09/24/2022   Hypomagnesemia 09/24/2022   Debility 09/23/2022   Hypokalemia 09/23/2022   B12 deficiency 09/05/2022   Folate deficiency 09/05/2022   Carpal tunnel syndrome, left upper limb 05/11/2019    Class: Chronic   Spinal stenosis of lumbar region with neurogenic claudication 08/20/2018   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   Primary osteoarthritis of right knee 02/27/2017   Right tennis elbow 11/11/2016   Muscle cramps 01/15/2016   Bilateral leg edema 01/08/2016   Radiculopathy 12/20/2015   Hyperglycemia 12/21/2014   Mastodynia, female 11/30/2014   S/P excision of fibroadenoma of breast 11/30/2014   Angioedema of lips  04/07/2014   Fibroadenoma of right breast 03/07/2014   Pelvic pain in female 06/19/2012   Menorrhagia 06/11/2012   SUI (stress urinary incontinence, female) 06/11/2012   HTN (hypertension) 06/11/2012   IBS (irritable bowel syndrome) - diarrhea predominant 03/17/2012   MICROSCOPIC HEMATURIA 11/30/2010   BREAST PAIN, RIGHT 11/30/2010   BREAST MASS, RIGHT 01/12/2010   HYPERCHOLESTEROLEMIA 12/07/2007   CIGARETTE SMOKER 12/07/2007   DEGENERATIVE JOINT DISEASE 12/07/2007   Low back pain with sciatica 12/07/2007   HEADACHE 12/07/2007   Obesity 08/03/2007   Anxiety state 08/03/2007    ONSET DATE: 12/05/22- referral date  REFERRING DIAG: G61.0 (ICD-10-CM) - Guillain Barr syndrome (HCC)  THERAPY DIAG:    Rationale for Evaluation and Treatment: Rehabilitation  SUBJECTIVE:   SUBJECTIVE STATEMENT: Denies pain, reports she wants to be able to go out to eat fish this weekend  PERTINENT HISTORY: Pt is a 57 y/o female hospitalized 09/23/22  with progressive weakness and sensation changes, consistent with Guillain-Barr syndrome. MRI negative. LP results and assessment most consistent with Guillain-Barr syndrome.  Pt was d/c home from CIR 11/06/22 PMH includes: glaucoma anxiety, DJD, fibromyalgia, HTN, tobacco use, B-12 deficiency, OA, ACDF 2017. Home health OT/PT wrapped up last week.  PRECAUTIONS: Fall  WEIGHT BEARING RESTRICTIONS: no  PAIN: no  FALLS: Has patient fallen in last 6 months? No  LIVING ENVIRONMENT: Lives with: lives with their spouse Lives in: House/apartment Stairs: has ramp built Has following equipment at home:   BSC, steadi,  tub bench but can't access bathroom   PLOF: Independent  PATIENT GOALS: increase independence   OBJECTIVE:   HAND DOMINANCE: Right  ADLs: Overall ADLs: assistance required, unable to access BR Transfers/ambulation related to ADLs: Eating: unable to cut food Grooming: set up UB Dressing: set up for bra and shirt LB Dressing: max  A Toileting: uses BSC uses steadi Bathing: min-mod A in hospital  bed  Tub Shower transfers: n/a Equipment: Emergency planning/management officer  IADLs:dependent for IADLS   MOBILITY STATUS:  uses w/c  , per PT report minguard for transfer from w/c to mat    ACTIVITY TOLERANCE: Activity tolerance: Pt fatigues quickly    UPPER EXTREMITY ROM:    Active ROM Right eval Left eval  Shoulder flexion 120 100  Shoulder abduction 90 90  Shoulder adduction    Shoulder extension    Shoulder internal rotation    Shoulder external rotation    Elbow flexion    Elbow extension  -25  Wrist flexion    Wrist extension    Wrist ulnar deviation    Wrist radial deviation    Wrist pronation    Wrist supination    (Blank rows = not tested)  UPPER EXTREMITY MMT:     MMT Right eval Left eval  Shoulder flexion  3+/5 3-/5  Shoulder abduction    Shoulder adduction    Shoulder extension    Shoulder internal rotation    Shoulder external rotation    Middle trapezius    Lower trapezius    Elbow flexion 3+/5 3/5  Elbow extension 3/5 3/5  Wrist flexion    Wrist extension    Wrist ulnar deviation    Wrist radial deviation    Wrist pronation    Wrist supination    (Blank rows = not tested)  HAND FUNCTION: Grip strength: Right: 32 lbs; Left: 18 lbs  COORDINATION: 9 Hole Peg test: Right: 1 min 48 sec; Left: 4 pegs in 2 mins  Box and Blocks:  Right 35 blocks, Left  26 blocks  SENSATION: Light touch: WFL Hot/Cold: Not tested    COGNITION: Overall cognitive status:NT   VISION: Subjective report: reports hx of glaucoma VISION ASSESSMENT: Not tested  OBSERVATIONS: Pt is eager to improve, pt's husband is very supportive   TODAY'S TREATMENT:                                                                                                                              DATE: 01/09/23-Arm bike level 2 for conditioning x 8 mins  Placing/ removing large plastic pegs into pegboard with LUE for  increased fine motor coordination, min-mod difficulty and drops, v.c to slow down and watch her hand. Placing graded clothespins-1-8#  on vertical antennae with RUE and left UE, removing with LUE for sustained pinch. Gripper set at level 1 to pick up 1 inch blocks with LUE for sustained grip, min difficulty/ v.c, then using RUE to pick up 1 inch blocks with RUE for sustained grip, min difficulty/ v.c    12/31/22- placing and removing wooden dowel pegs with LUE, pt was able to perform with good accuracy, no drops. Pt still has near absent sensation in bilateral UE's. Pt reports frying french fries at home and she did ok. Pt was cautioned due to absent sensation that she is at increased risk for burns. Therapist recommends pt performs cold sandwich/ snack prep initially. Functional reaching with left and right UE's to place and remove graded clothespins from vertical antennae, for sustained pinch, mod-max difficulty with LUE, min-mod difficulty with RUE 1-8# pinch. Folding towels with bilateral UE's with good control. Pt was encouraged to fold laundry at home. Pt reports she has yellow putty at home. Pt was encouraged to continue working on grip and sustained pinch exercises with her putty.   12/24/22- Seated in w/c reviewed closed chain shoulder flexion and chest press then added shoulder abduction exercises 10 reps each, min v.c Pt transferred to mat scoot pivot with min guard assist and min v.c  Seated edge of mat shoulder flexion , biceps curls and chest press with 1 lb weight in each UE for increased strength and core control, min v.c Pt transitioned to supine with min A. Shoulder flexion and chest press with  3 lbs bar in supine, pt's L arm was noted to fatigues more quickly than right. Supine to sit  with min A. Pt transferred scoot pivot back to w/c with min A. Arm bike x 5 mins level 1 for conditioning. 1 rest break required.    12/17/22-Pt was instructed in coordination HEP, pt demonstrates min  difficulty with RUE with most exercises , mod difficulty with left for most exercises, however mod difficulty dealing cards with RUE, min v.c for performance. Shelf liner used for coins for increased ease. Cane exercises for chest press x 20 and shoulder flexion x 10 reps min v.c to avoid compensation.   PATIENT EDUCATION: Education details: see above Person educated: Patient Education method: explanation, demonstration Education comprehension: verbalized understanding, returned demonstration, and verbal cues required   HOME EXERCISE PROGRAM:  12/17/22- coordination HEP, cane exercises seated   GOALS: Goals reviewed with patient? Yes  SHORT TERM GOALS: Target date: 01/11/23  I with initial HEP Baseline: Goal status: ongoing 12/31/22  2.  Pt will demonstrate improved UE functional use as evidenced increasing bilateral box/ blocks score by 4 blocks.  Baseline: R 35, L 26 Goal status  met 12/31/22 RUE :43:  LUE 35    3.  Pt will donn pants with mod A. Baseline: max A Goal status: ongoing  4. Pt will verbalize understanding of adapted strategies to maximize safety and I with ADLs/ IADLs .   Goal status:  ongoing  5.  Pt will demonstrate ability to stand for functional activity with min A x 10 mins in prep for ADLs.  Goal status: ongoing  6.  Pt will perform w/c to [facility] transfers mod I  Goal status:  ongoing 01/02/23  LONG TERM GOALS: Target date: 03/06/23  I with updated HEP  Goal status: INITIAL  2.  Pt will perform LB dressing mod I. Baseline:max A Goal status: INITIAL  3.  Pt will bathe with supervision/ set up. Baseline: min-mod A for sponge bath Goal status: INITIAL  4.  Pt will perform basic cooking at a w/c level mod I  Goal status: ongoing pt reports cooking french fries mod I. Pt was cautioned at this time due to   5.  Pt will demonstrate improved fine motor coordination as evidenced by performing LUE 9 hole peg test in 2 mins or less.   Goal status:  INITIAL  6.  Pt will demonstrate improved fine motor coordination as evidenced by performing RUE 9 hole peg test in 1 mins  30 secs or less.  Baseline: RUE  1 min 48 secs, LUE 4 pegs placed in 2 mins  Goal status: INITIAL  ASSESSMENT:  CLINICAL IMPRESSION: Pt is progressing towards goals. She reports she is dressing herself except for shoes most of the ime. She still needs help with pants. PERFORMANCE DEFICITS: in functional skills including ADLs, IADLs, coordination, dexterity, sensation, ROM, strength, flexibility, Fine motor control, Gross motor control, mobility, balance, endurance, decreased knowledge of precautions, decreased knowledge of use of DME, and UE functional use, cognitive skills including  and psychosocial skills including coping strategies, environmental adaptation, habits, interpersonal interactions, and routines and behaviors.   IMPAIRMENTS: are limiting patient from ADLs, IADLs, rest and sleep, play, leisure, and social participation.   CO-MORBIDITIES: may have co-morbidities  that affects occupational performance. Patient will benefit from skilled OT to address above impairments and improve overall function.  MODIFICATION OR ASSISTANCE TO COMPLETE EVALUATION: No modification of tasks or assist necessary to complete an evaluation.  OT OCCUPATIONAL PROFILE AND HISTORY: Detailed assessment: Review of records and additional review of physical, cognitive, psychosocial history related to current functional performance.  CLINICAL DECISION MAKING: LOW - limited treatment options, no task modification necessary  REHAB POTENTIAL: Good  EVALUATION COMPLEXITY: Low    PLAN:  OT FREQUENCY: 2x/week  OT DURATION: 12 weeks  PLANNED INTERVENTIONS: self care/ADL training, therapeutic exercise, therapeutic activity, neuromuscular re-education, manual therapy, passive range of motion, gait training, balance training, stair training, functional mobility training, aquatic therapy,  ultrasound, paraffin, moist heat, cryotherapy, patient/family education, energy conservation, coping strategies training, DME and/or AE instructions, and Re-evaluation  RECOMMENDED OTHER SERVICES: PT  CONSULTED AND AGREED WITH PLAN OF CARE: Patient and family member/caregiver  PLAN FOR NEXT SESSION:  continue ADL strategies, fine motor coordination, UE strength and endurance  Keyaan Lederman, OT 01/09/2023, 8:51 AM

## 2023-01-09 NOTE — Therapy (Signed)
OUTPATIENT PHYSICAL THERAPY LOWER EXTREMITY TREATMENT   Patient Name: DEEANA ATWATER MRN: [id number] DOB:[birth year], 57 y.o., female Today's Date: 01/09/2023  END OF SESSION:  PT End of Session - 01/09/23 0750     Visit Number 4    Date for PT Re-Evaluation 03/06/23    PT Start Time 0751    PT Stop Time 0843    PT Time Calculation (min) 52 min    Activity Tolerance Patient tolerated treatment well    Behavior During Therapy [facility] for tasks assessed/performed              Past Medical History:  Diagnosis Date   Anxiety    Back pain with radiation    Breast mass, right    Chronic pain    Chronic, continuous use of opioids    Complication of anesthesia 04/07/14   Allergic reaction to Lisinopril immediately following surgery   DJD (degenerative joint disease)    Fibromyalgia    Groin abscess    Headache(784.0)    History of IBS    Hypercholesterolemia    IBS (irritable bowel syndrome)    Lactose intolerance    Mild hypertension    Obesity    Tobacco use disorder    Umbilical hernia    Symptomatic   Past Surgical History:  Procedure Laterality Date   ABDOMINAL HYSTERECTOMY     ANTERIOR CERVICAL DECOMP/DISCECTOMY FUSION N/A 12/20/2015   Procedure: ANTERIOR CERVICAL DECOMPRESSION FUSION CERVICAL 4-5, CERVICAL 5-6, CERVICAL 6-7 WITH INSTRUMENTATION AND ALLOGRAFT;  Surgeon: Estill Bamberg, MD;  Location: [facility] OR;  Service: Orthopedics;  Laterality: N/A;  Anterior cervical decompression fusion, cervical 4-5, cervical 5-6, cervical 6-7 with instrumentation and allograft   BACK SURGERY     BILATERAL SALPINGECTOMY  09/03/2012   Procedure: BILATERAL SALPINGECTOMY;  Surgeon: Ok Edwards, MD;  Location: [facility] ORS;  Service: Gynecology;  Laterality: Bilateral;   BREAST BIOPSY Right 04/07/2014   Procedure: REMOVAL RIGHT BREAST MASS WITH WIRE LOCALIZATION;  Surgeon: Adolph Pollack, MD;  Location: [facility] OR;  Service: General;  Laterality: Right;   BREAST EXCISIONAL BIOPSY Left    x2    BREAST LUMPECTOMY     x2   CARPAL TUNNEL RELEASE Left 05/11/2019   Procedure: LEFT CARPAL TUNNEL RELEASE, RIGHT TENNIS ELBOW MARCAINE/DEPO MEDROL INJECTION UNDER ANESTHESIA;  Surgeon: Kerrin Champagne, MD;  Location: [facility] OR;  Service: Orthopedics;  Laterality: Left;   COLONOSCOPY W/ BIOPSIES  04/24/2012   per Dr. Leone Payor, clear, repeat in 10 yrs    disectomy     ESOPHAGOGASTRODUODENOSCOPY     FINGER SURGERY     Right index-excision of mass    FOOT SURGERY Right    Bone Spurs   LAPAROSCOPIC HYSTERECTOMY  09/03/2012   Procedure: HYSTERECTOMY TOTAL LAPAROSCOPIC;  Surgeon: Ok Edwards, MD;  Location: [facility]S;  Service: Gynecology;  Laterality: N/A;   LUMBAR DISC SURGERY     TUBAL LIGATION     UMBILICAL HERNIA REPAIR N/A 05/01/2018   Procedure: UMBILICAL HERNIA REPAIR;  Surgeon: Jimmye Norman, MD;  Location: [facility] OR;  Service: General;  Laterality: N/A;   Patient Active Problem List   Diagnosis Date Noted   Wheelchair dependence 12/30/2022   Nerve pain 12/30/2022   Insomnia due to medical condition 12/30/2022   Guillain Barr syndrome 10/01/2022   UTI (urinary tract infection) 09/24/2022   Hypomagnesemia 09/24/2022   Debility 09/23/2022   Hypokalemia 09/23/2022   B12 deficiency 09/05/2022   Folate deficiency 09/05/2022   Carpal  tunnel syndrome, left upper limb 05/11/2019    Class: Chronic   Spinal stenosis of lumbar region with neurogenic claudication 08/20/2018   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   Primary osteoarthritis of right knee 02/27/2017   Right tennis elbow 11/11/2016   Muscle cramps 01/15/2016   Bilateral leg edema 01/08/2016   Radiculopathy 12/20/2015   Hyperglycemia 12/21/2014   Mastodynia, female 11/30/2014   S/P excision of fibroadenoma of breast 11/30/2014   Angioedema of lips 04/07/2014   Fibroadenoma of right breast 03/07/2014   Pelvic pain in female 06/19/2012   Menorrhagia 06/11/2012   SUI (stress urinary incontinence, female) 06/11/2012   HTN  (hypertension) 06/11/2012   IBS (irritable bowel syndrome) - diarrhea predominant 03/17/2012   MICROSCOPIC HEMATURIA 11/30/2010   BREAST PAIN, RIGHT 11/30/2010   BREAST MASS, RIGHT 01/12/2010   HYPERCHOLESTEROLEMIA 12/07/2007   CIGARETTE SMOKER 12/07/2007   DEGENERATIVE JOINT DISEASE 12/07/2007   Low back pain with sciatica 12/07/2007   HEADACHE 12/07/2007   Obesity 08/03/2007   Anxiety state 08/03/2007    PCP: Nelwyn Salisbury, MD  REFERRING PROVIDER: Nelwyn Salisbury, MD  REFERRING DIAG: G61.0 (ICD-10-CM) - Guillain Barr syndrome [facility])   THERAPY DIAG:  Other lack of coordination  Muscle weakness (generalized)  Unsteadiness on feet  Difficulty in walking, not elsewhere classified  Guillain Barr syndrome  Rationale for Evaluation and Treatment: Rehabilitation  ONSET DATE: 12/05/22  SUBJECTIVE:   SUBJECTIVE STATEMENT: Patient reports that she gets sore in the mms, "I think it is a good thing"  OBJECTIVE STATEMENT: BRANDACE CARGLE is a 57 y.o. female who presented to the [facility] ED on 09/23/2022 with bilateral lower extremity weakness with decreased mobility that has progressed to both arms. She also reported numbness and tingling of the extremities. She was transferred to [facility] for MRI. 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  Inpatient Rehab F/B HHPT.  R knee injection 10/08/22 trigger point injections for worsening back pain 2/15  RA  PAIN:  Are you having pain? Yes: NPRS scale: up to 10/10 Pain location: all over, but her R knee pain is worst,  Pain description: Sharp Aggravating factors: Has difficulty standing up Relieving factors: Medicine helps somewhat  PRECAUTIONS: Fall  WEIGHT BEARING RESTRICTIONS: No  FALLS:  Has patient fallen in last 6 months? No  LIVING ENVIRONMENT: Lives with: lives with their family Lives in: House/apartment Stairs: Nohas a ramp Has following equipment at home: Environmental consultant - 2 wheeled, Wheelchair (power),  Tour manager, bed side commode, Ramped entry, and [employer] lift  OCCUPATION: on disability due to RA and a fall several years ago  PLOF: Independent  PATIENT GOALS: walk  NEXT MD VISIT: She is scheduled to follow up with Dr. Serita Sheller with Physical Med/Rehab on 12-30-22. She is scheduled to follow up with Dr. Levert Feinstein at Neurology on 01-28-23.   OBJECTIVE:   DIAGNOSTIC FINDINGS:  Right knee radiographs 11/09/2020   FINDINGS: Severe patellofemoral joint space narrowing. Severe superior and mild inferior patellar degenerative osteophytosis. Moderate superior trochlear degenerative osteophytosis. No joint effusion. Severe medial compartment joint space narrowing with moderate peripheral medial and lateral compartment degenerative osteophytes.  COGNITION: Overall cognitive status: Within functional limits for tasks assessed     SENSATION: Light touch: Impaired  and B hands and feet and lower legs  EDEMA:  BLE edema noted.  MUSCLE LENGTH: Hamstrings: WFL Thomas test: B tightness, to neutral  POSTURE: rounded shoulders and flexed trunk   PALPATION: No TTP  LOWER  EXTREMITY ROM: BLE ROM limited in all planes an all joints due to body habitus   LOWER EXTREMITY MMT:  MMT Right eval Left eval  Hip flexion 3 3-  Hip extension 3- 3-  Hip abduction 3 3-  Hip adduction    Hip internal rotation    Hip external rotation    Knee flexion 4 4-  Knee extension 4- 4-  Ankle dorsiflexion 4- 4-  Ankle plantarflexion    Ankle inversion 4- 4-  Ankle eversion 4- 4-   (Blank rows = not tested)  FUNCTIONAL TESTS:  5 times sit to stand: unable to complete Timed up and go (TUG): unable to complete  Bed Mobility: Occasional min A for sup <> sit, rolled B on mat with great effort, light min A  TRANSFERS: Scooting transfer with CGA, stand pivot with CGA, she reports that she sometimes has difficulty rising due to her knee pain  GAIT: Distance walked: 0' Assistive device utilized:   shopping cart-will bring her RW from home. Level of assistance: Min A Comments: Stood and weight shifted, then stood again and took steps to turn to W/C.   TODAY'S TREATMENT:                                                                                                                              DATE:  01/09/23 Nustep level 4 x 6 minutes 5# rows on machine pulleys 2x10 5# straight arm pulls 2x10 5# chest press pulleys 2x10 Ball isometric abs 2x10 Standing in walker x 45 seconds cues for posture and knees then again for one minute with weight shifts and small marches HS stretches in sitting   LAQ 1# right, 2.5# left 3x10 Seated hip abduction Marching 1# right, no weight left 3x10 Ball b/n knees squeeze 3x10 Red tband HS curls Standing with walker march weight shift and standing x 1 minute  01/02/23 Seated long kicks and hip flexion 2 x 10 reps each. Seated heel raise/toe raise 2 x 10 reps Facing mat with seat of W/C elevated, sit to stand, CGA, x 3 Scoot transfer to NuStep, S NuStep L3 x 4 minutes, then 20 sec at L5 with legs only Stand pivot transfer to W/C. Min A. Initiatlly fearful and unable ot step with LLE. Improved on second attempt, VC for upright posture which helped. Seated rotation to L, slide hands out to the side and back, 2 x 5 in each direction Stand pivot to W/C with RW and min A.  12/31/22 LAQ 2x10 Seated marching 20 reps  Seated heel to toe raises 2x10 HS curls 2x10 red  Ball squeezes 2x10  Hip abd with band green 2x10  Mini squats in // bars x5 1 step forward and back in // bars   12/12/22  Education  PATIENT EDUCATION:  Education details: POC Person educated: Patient Education method: Explanation Education comprehension: verbalized understanding  HOME EXERCISE PROGRAM: TBD  ASSESSMENT:  CLINICAL IMPRESSION: Patient able  to try everything I threw at her today, she had weakness in the knees but also some flexion contractures and this made  standing difficult, very tight HS when I tried to stretch her.  She has difficulty bearing weight on the right, weakness on left hip flexor.  OBJECTIVE IMPAIRMENTS: Abnormal gait, decreased activity tolerance, decreased balance, decreased coordination, decreased endurance, difficulty walking, decreased ROM, decreased strength, and postural dysfunction.   ACTIVITY LIMITATIONS: carrying, lifting, bending, standing, squatting, stairs, transfers, and locomotion level  PARTICIPATION LIMITATIONS: meal prep, cleaning, laundry, driving, and shopping  PERSONAL FACTORS: Fitness, Past/current experiences, and 1 comorbidity: Recently diagnosed with GBS  are also affecting patient's functional outcome.   REHAB POTENTIAL: Good  CLINICAL DECISION MAKING: Evolving/moderate complexity  EVALUATION COMPLEXITY: Moderate   GOALS: Goals reviewed with patient? Yes  SHORT TERM GOALS: Target date: 11/28/22 I with initial HEP Baseline: Goal status: met 01/09/23  LONG TERM GOALS: Target date: 03/06/23  I with final HEP Baseline:  Goal status: INITIAL  2.  Decrease 5x STS to < 14 sec Baseline: unable to complete Goal status: 01/02/23-Initiated strengthening  3.  Decrease TUG to < 20 sec Baseline: Unable to complete. Goal status: INITIAL  4.  Patient will ambulate at least 300' with LRAD, MI, on level and unlevel surfaces. Baseline: Stand pivot transfer with CGA Goal status: INITIAL  5.  Perform all bed mobility with I Baseline: Occ min A on mat Goal status: INITIAL  6.  Increase BLE strength to at least 4/5 throughout. Baseline: (3-)-3/5 Goal status: INITIAL  PLAN:  PT FREQUENCY: 1-2x/week  PT DURATION: 12 weeks  PLANNED INTERVENTIONS: Therapeutic exercises, Therapeutic activity, Neuromuscular re-education, Balance training, Gait training, Patient/Family education, Self Care, Joint mobilization, Stair training, Dry Needling, Electrical stimulation, Cryotherapy, Moist heat, Ultrasound,  Ionotophoresis /ml Dexamethasone, and Manual therapy  PLAN FOR NEXT SESSION: continue to progress as tolerated Stacie Glaze, PT 01/09/2023, 7:54 AM

## 2023-01-14 ENCOUNTER — Encounter: Payer: Self-pay | Admitting: Occupational Therapy

## 2023-01-14 ENCOUNTER — Encounter: Payer: Self-pay | Admitting: Physical Therapy

## 2023-01-14 ENCOUNTER — Ambulatory Visit: Payer: 59 | Admitting: Physical Therapy

## 2023-01-14 ENCOUNTER — Ambulatory Visit: Payer: 59 | Admitting: Occupational Therapy

## 2023-01-14 DIAGNOSIS — R278 Other lack of coordination: Secondary | ICD-10-CM

## 2023-01-14 DIAGNOSIS — R2681 Unsteadiness on feet: Secondary | ICD-10-CM

## 2023-01-14 DIAGNOSIS — M6281 Muscle weakness (generalized): Secondary | ICD-10-CM

## 2023-01-14 DIAGNOSIS — G610 Guillain-Barre syndrome: Secondary | ICD-10-CM

## 2023-01-14 DIAGNOSIS — R2689 Other abnormalities of gait and mobility: Secondary | ICD-10-CM

## 2023-01-14 DIAGNOSIS — R262 Difficulty in walking, not elsewhere classified: Secondary | ICD-10-CM

## 2023-01-14 NOTE — Therapy (Signed)
OUTPATIENT PHYSICAL THERAPY LOWER EXTREMITY TREATMENT   Patient Name: Dana [name] MRN: [id number] DOB:1965/10/13, 57 y.o., female Today's Date: 01/14/2023  END OF SESSION:  PT End of Session - 01/14/23 0849     Visit Number 5    Date for PT Re-Evaluation 03/06/23    PT Start Time 0842    PT Stop Time 0922    PT Time Calculation (min) 40 min    Equipment Utilized During Treatment Gait belt    Activity Tolerance Patient tolerated treatment well    Behavior During Therapy [facility] for tasks assessed/performed              Past Medical History:  Diagnosis Date   Anxiety    Back pain with radiation    Breast mass, right    Chronic pain    Chronic, continuous use of opioids    Complication of anesthesia 04/07/14   Allergic reaction to Lisinopril immediately following surgery   DJD (degenerative joint disease)    Fibromyalgia    Groin abscess    Headache(784.0)    History of IBS    Hypercholesterolemia    IBS (irritable bowel syndrome)    Lactose intolerance    Mild hypertension    Obesity    Tobacco use disorder    Umbilical hernia    Symptomatic   Past Surgical History:  Procedure Laterality Date   ABDOMINAL HYSTERECTOMY     ANTERIOR CERVICAL DECOMP/DISCECTOMY FUSION N/A 12/20/2015   Procedure: ANTERIOR CERVICAL DECOMPRESSION FUSION CERVICAL 4-5, CERVICAL 5-6, CERVICAL 6-7 WITH INSTRUMENTATION AND ALLOGRAFT;  Surgeon: Estill Bamberg, MD;  Location: [facility] OR;  Service: Orthopedics;  Laterality: N/A;  Anterior cervical decompression fusion, cervical 4-5, cervical 5-6, cervical 6-7 with instrumentation and allograft   BACK SURGERY     BILATERAL SALPINGECTOMY  09/03/2012   Procedure: BILATERAL SALPINGECTOMY;  Surgeon: Ok Edwards, MD;  Location: [facility] ORS;  Service: Gynecology;  Laterality: Bilateral;   BREAST BIOPSY Right 04/07/2014   Procedure: REMOVAL RIGHT BREAST MASS WITH WIRE LOCALIZATION;  Surgeon: Adolph Pollack, MD;  Location: [facility] OR;  Service: General;   Laterality: Right;   BREAST EXCISIONAL BIOPSY Left    x2   BREAST LUMPECTOMY     x2   CARPAL TUNNEL RELEASE Left 05/11/2019   Procedure: LEFT CARPAL TUNNEL RELEASE, RIGHT TENNIS ELBOW MARCAINE/DEPO MEDROL INJECTION UNDER ANESTHESIA;  Surgeon: Kerrin Champagne, MD;  Location: [facility] OR;  Service: Orthopedics;  Laterality: Left;   COLONOSCOPY W/ BIOPSIES  04/24/2012   per Dr. Leone Payor, clear, repeat in 10 yrs    disectomy     ESOPHAGOGASTRODUODENOSCOPY     FINGER SURGERY     Right index-excision of mass    FOOT SURGERY Right    Bone Spurs   LAPAROSCOPIC HYSTERECTOMY  09/03/2012   Procedure: HYSTERECTOMY TOTAL LAPAROSCOPIC;  Surgeon: Ok Edwards, MD;  Location: [facility] ORS;  Service: Gynecology;  Laterality: N/A;   LUMBAR DISC SURGERY     TUBAL LIGATION     UMBILICAL HERNIA REPAIR N/A 05/01/2018   Procedure: UMBILICAL HERNIA REPAIR;  Surgeon: Jimmye Norman, MD;  Location: [facility] OR;  Service: General;  Laterality: N/A;   Patient Active Problem List   Diagnosis Date Noted   Wheelchair dependence 12/30/2022   Nerve pain 12/30/2022   Insomnia due to medical condition 12/30/2022   Guillain Barr syndrome (HCC) 10/01/2022   UTI (urinary tract infection) 09/24/2022   Hypomagnesemia 09/24/2022   Debility 09/23/2022   Hypokalemia 09/23/2022   B12  deficiency 09/05/2022   Folate deficiency 09/05/2022   Carpal tunnel syndrome, left upper limb 05/11/2019    Class: Chronic   Spinal stenosis of lumbar region with neurogenic claudication 08/20/2018   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   Primary osteoarthritis of right knee 02/27/2017   Right tennis elbow 11/11/2016   Muscle cramps 01/15/2016   Bilateral leg edema 01/08/2016   Radiculopathy 12/20/2015   Hyperglycemia 12/21/2014   Mastodynia, female 11/30/2014   S/P excision of fibroadenoma of breast 11/30/2014   Angioedema of lips 04/07/2014   Fibroadenoma of right breast 03/07/2014   Pelvic pain in female 06/19/2012   Menorrhagia 06/11/2012    SUI (stress urinary incontinence, female) 06/11/2012   HTN (hypertension) 06/11/2012   IBS (irritable bowel syndrome) - diarrhea predominant 03/17/2012   MICROSCOPIC HEMATURIA 11/30/2010   BREAST PAIN, RIGHT 11/30/2010   BREAST MASS, RIGHT 01/12/2010   HYPERCHOLESTEROLEMIA 12/07/2007   CIGARETTE SMOKER 12/07/2007   DEGENERATIVE JOINT DISEASE 12/07/2007   Low back pain with sciatica 12/07/2007   HEADACHE 12/07/2007   Obesity 08/03/2007   Anxiety state 08/03/2007    PCP: Nelwyn Salisbury, MD  REFERRING PROVIDER: Nelwyn Salisbury, MD  REFERRING DIAG: G61.0 (ICD-10-CM) - Guillain Barr syndrome [facility])   THERAPY DIAG:  Other lack of coordination  Muscle weakness (generalized)  Unsteadiness on feet  Guillain Barr syndrome (HCC)  Difficulty in walking, not elsewhere classified  Rationale for Evaluation and Treatment: Rehabilitation  ONSET DATE: 12/05/22  SUBJECTIVE:   SUBJECTIVE STATEMENT: Patient reports that she gets sore in the mms, "I think it is a good thing"  OBJECTIVE STATEMENT: Dana [name] is a 57 y.o. female who presented to the [facility] ED on 09/23/2022 with bilateral lower extremity weakness with decreased mobility that has progressed to both arms. She also reported numbness and tingling of the extremities. She was transferred to [facility] for MRI. 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  Inpatient Rehab F/B HHPT.  R knee injection 10/08/22 trigger point injections for worsening back pain 2/15  RA  PAIN:  Are you having pain? Yes: NPRS scale: up to 10/10 Pain location: all over, but her R knee pain is worst,  Pain description: Sharp Aggravating factors: Has difficulty standing up Relieving factors: Medicine helps somewhat  PRECAUTIONS: Fall  WEIGHT BEARING RESTRICTIONS: No  FALLS:  Has patient fallen in last 6 months? No  LIVING ENVIRONMENT: Lives with: lives with their family Lives in: House/apartment Stairs: Nohas a ramp Has  following equipment at home: Environmental consultant - 2 wheeled, Wheelchair (power), Tour manager, bed side commode, Ramped entry, and [employer] lift  OCCUPATION: on disability due to RA and a fall several years ago  PLOF: Independent  PATIENT GOALS: walk  NEXT MD VISIT: She is scheduled to follow up with Dr. Serita Sheller with Physical Med/Rehab on 12-30-22. She is scheduled to follow up with Dr. Levert Feinstein at Neurology on 01-28-23.   OBJECTIVE:   DIAGNOSTIC FINDINGS:  Right knee radiographs 11/09/2020   FINDINGS: Severe patellofemoral joint space narrowing. Severe superior and mild inferior patellar degenerative osteophytosis. Moderate superior trochlear degenerative osteophytosis. No joint effusion. Severe medial compartment joint space narrowing with moderate peripheral medial and lateral compartment degenerative osteophytes.  COGNITION: Overall cognitive status: Within functional limits for tasks assessed     SENSATION: Light touch: Impaired  and B hands and feet and lower legs  EDEMA:  BLE edema noted.  MUSCLE LENGTH: Hamstrings: WFL Thomas test: B tightness, to neutral  POSTURE: rounded  shoulders and flexed trunk   PALPATION: No TTP  LOWER EXTREMITY ROM: BLE ROM limited in all planes an all joints due to body habitus   LOWER EXTREMITY MMT:  MMT Right eval Left eval  Hip flexion 3 3-  Hip extension 3- 3-  Hip abduction 3 3-  Hip adduction    Hip internal rotation    Hip external rotation    Knee flexion 4 4-  Knee extension 4- 4-  Ankle dorsiflexion 4- 4-  Ankle plantarflexion    Ankle inversion 4- 4-  Ankle eversion 4- 4-   (Blank rows = not tested)  FUNCTIONAL TESTS:  5 times sit to stand: unable to complete Timed up and go (TUG): unable to complete  Bed Mobility: Occasional min A for sup <> sit, rolled B on mat with great effort, light min A  TRANSFERS: Scooting transfer with CGA, stand pivot with CGA, she reports that she sometimes has difficulty rising due to her  knee pain  GAIT: Distance walked: 0' Assistive device utilized:  shopping cart-will bring her RW from home. Level of assistance: Min A Comments: Stood and weight shifted, then stood again and took steps to turn to W/C.   TODAY'S TREATMENT:                                                                                                                              DATE:  01/14/23 Mat work to activate trunk and LE-Rolling to L side lie and back with R arm and leg lifted off mat. Then rolled to L side and performed R hip abd with forward and back slow swing. Repeated to R, had increased difficulty lifting LLE off mat-performed lower body ant/post shifts, then repeated with improved control noted. NuStep L3 x 6 min, then L8 x 2 minutes, legs only. Performed stand pivot transfers x 2 using RW Ambulated x 8 steps with RW and CGA, following with W/C, fearful, but stable.  01/09/23 Nustep level 4 x 6 minutes 5# rows on machine pulleys 2x10 5# straight arm pulls 2x10 5# chest press pulleys 2x10 Ball isometric abs 2x10 Standing in walker x 45 seconds cues for posture and knees then again for one minute with weight shifts and small marches HS stretches in sitting   LAQ 1# right, 2.5# left 3x10 Seated hip abduction Marching 1# right, no weight left 3x10 Ball b/n knees squeeze 3x10 Red tband HS curls Standing with walker march weight shift and standing x 1 minute  01/02/23 Seated long kicks and hip flexion 2 x 10 reps each. Seated heel raise/toe raise 2 x 10 reps Facing mat with seat of W/C elevated, sit to stand, CGA, x 3 Scoot transfer to NuStep, S NuStep L3 x 4 minutes, then 20 sec at L5 with legs only Stand pivot transfer to W/C. Min A. Initiatlly fearful and unable ot step with LLE. Improved on second attempt, VC for upright posture which helped. Seated rotation  to L, slide hands out to the side and back, 2 x 5 in each direction Stand pivot to W/C with RW and min A.  12/31/22 LAQ  2x10 Seated marching 20 reps  Seated heel to toe raises 2x10 HS curls 2x10 red  Ball squeezes 2x10  Hip abd with band green 2x10  Mini squats in // bars x5 1 step forward and back in // bars   12/12/22  Education  PATIENT EDUCATION:  Education details: POC Person educated: Patient Education method: Explanation Education comprehension: verbalized understanding  HOME EXERCISE PROGRAM: TBD  ASSESSMENT:  CLINICAL IMPRESSION: Patient participated well, fearful of knees giving way. Emphasized activation of trunk muscles for improved stability, then progressed to leg strengthening,and then gait.  OBJECTIVE IMPAIRMENTS: Abnormal gait, decreased activity tolerance, decreased balance, decreased coordination, decreased endurance, difficulty walking, decreased ROM, decreased strength, and postural dysfunction.   ACTIVITY LIMITATIONS: carrying, lifting, bending, standing, squatting, stairs, transfers, and locomotion level  PARTICIPATION LIMITATIONS: meal prep, cleaning, laundry, driving, and shopping  PERSONAL FACTORS: Fitness, Past/current experiences, and 1 comorbidity: Recently diagnosed with GBS  are also affecting patient's functional outcome.   REHAB POTENTIAL: Good  CLINICAL DECISION MAKING: Evolving/moderate complexity  EVALUATION COMPLEXITY: Moderate   GOALS: Goals reviewed with patient? Yes  SHORT TERM GOALS: Target date: 11/28/22 I with initial HEP Baseline: Goal status: met 01/09/23  LONG TERM GOALS: Target date: 03/06/23  I with final HEP Baseline:  Goal status: INITIAL  2.  Decrease 5x STS to < 14 sec Baseline: unable to complete Goal status: 01/02/23-Initiated strengthening  3.  Decrease TUG to < 20 sec Baseline: Unable to complete. Goal status: INITIAL  4.  Patient will ambulate at least 300' with LRAD, MI, on level and unlevel surfaces. Baseline: Stand pivot transfer with CGA Goal status: INITIAL  5.  Perform all bed mobility with I Baseline: Occ min  A on mat Goal status: INITIAL  6.  Increase BLE strength to at least 4/5 throughout. Baseline: (3-)-3/5 Goal status: INITIAL  PLAN:  PT FREQUENCY: 1-2x/week  PT DURATION: 12 weeks  PLANNED INTERVENTIONS: Therapeutic exercises, Therapeutic activity, Neuromuscular re-education, Balance training, Gait training, Patient/Family education, Self Care, Joint mobilization, Stair training, Dry Needling, Electrical stimulation, Cryotherapy, Moist heat, Ultrasound, Ionotophoresis 4mg /ml Dexamethasone, and Manual therapy  PLAN FOR NEXT SESSION: continue to progress as tolerated Stacie Glaze, PT 01/14/2023, 9:26 AM

## 2023-01-14 NOTE — Therapy (Signed)
OUTPATIENT OCCUPATIONAL THERAPY NEURO Treatment  Patient Name: Dana [name] MRN: [id number] DOB:[birth year], 57 y.o., female Today's Date: 01/14/2023  PCP: Dr. Clent Ridges REFERRING PROVIDER: Dr. Gershon Crane  END OF SESSION:  OT End of Session - 01/14/23 0838     Visit Number 7    Number of Visits 25    Date for OT Re-Evaluation 03/06/23    Authorization Type UHC, Medicare    Authorization - Visit Number 7    Progress Note Due on Visit 10    OT Start Time 0930    OT Stop Time 1013    OT Time Calculation (min) 43 min    Activity Tolerance Patient tolerated treatment well    Behavior During Therapy [facility] for tasks assessed/performed               Past Medical History:  Diagnosis Date   Anxiety    Back pain with radiation    Breast mass, right    Chronic pain    Chronic, continuous use of opioids    Complication of anesthesia 04/07/14   Allergic reaction to Lisinopril immediately following surgery   DJD (degenerative joint disease)    Fibromyalgia    Groin abscess    Headache(784.0)    History of IBS    Hypercholesterolemia    IBS (irritable bowel syndrome)    Lactose intolerance    Mild hypertension    Obesity    Tobacco use disorder    Umbilical hernia    Symptomatic   Past Surgical History:  Procedure Laterality Date   ABDOMINAL HYSTERECTOMY     ANTERIOR CERVICAL DECOMP/DISCECTOMY FUSION N/A 12/20/2015   Procedure: ANTERIOR CERVICAL DECOMPRESSION FUSION CERVICAL 4-5, CERVICAL 5-6, CERVICAL 6-7 WITH INSTRUMENTATION AND ALLOGRAFT;  Surgeon: Estill Bamberg, MD;  Location: [facility] OR;  Service: Orthopedics;  Laterality: N/A;  Anterior cervical decompression fusion, cervical 4-5, cervical 5-6, cervical 6-7 with instrumentation and allograft   BACK SURGERY     BILATERAL SALPINGECTOMY  09/03/2012   Procedure: BILATERAL SALPINGECTOMY;  Surgeon: Ok Edwards, MD;  Location: [facility] ORS;  Service: Gynecology;  Laterality: Bilateral;   BREAST BIOPSY Right 04/07/2014   Procedure:  REMOVAL RIGHT BREAST MASS WITH WIRE LOCALIZATION;  Surgeon: Adolph Pollack, MD;  Location: [facility] OR;  Service: General;  Laterality: Right;   BREAST EXCISIONAL BIOPSY Left    x2   BREAST LUMPECTOMY     x2   CARPAL TUNNEL RELEASE Left 05/11/2019   Procedure: LEFT CARPAL TUNNEL RELEASE, RIGHT TENNIS ELBOW MARCAINE/DEPO MEDROL INJECTION UNDER ANESTHESIA;  Surgeon: Kerrin Champagne, MD;  Location: [facility] OR;  Service: Orthopedics;  Laterality: Left;   COLONOSCOPY W/ BIOPSIES  04/24/2012   per Dr. Leone Payor, clear, repeat in 10 yrs    disectomy     ESOPHAGOGASTRODUODENOSCOPY     FINGER SURGERY     Right index-excision of mass    FOOT SURGERY Right    Bone Spurs   LAPAROSCOPIC HYSTERECTOMY  09/03/2012   Procedure: HYSTERECTOMY TOTAL LAPAROSCOPIC;  Surgeon: Ok Edwards, MD;  Location: [facility];  Service: Gynecology;  Laterality: N/A;   LUMBAR DISC SURGERY     TUBAL LIGATION     UMBILICAL HERNIA REPAIR N/A 05/01/2018   Procedure: UMBILICAL HERNIA REPAIR;  Surgeon: Jimmye Norman, MD;  Location: [facility] OR;  Service: General;  Laterality: N/A;   Patient Active Problem List   Diagnosis Date Noted   Wheelchair dependence 12/30/2022   Nerve pain 12/30/2022   Insomnia due to medical condition  12/30/2022   Guillain Barr syndrome (HCC) 10/01/2022   UTI (urinary tract infection) 09/24/2022   Hypomagnesemia 09/24/2022   Debility 09/23/2022   Hypokalemia 09/23/2022   B12 deficiency 09/05/2022   Folate deficiency 09/05/2022   Carpal tunnel syndrome, left upper limb 05/11/2019    Class: Chronic   Spinal stenosis of lumbar region with neurogenic claudication 08/20/2018   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   Primary osteoarthritis of right knee 02/27/2017   Right tennis elbow 11/11/2016   Muscle cramps 01/15/2016   Bilateral leg edema 01/08/2016   Radiculopathy 12/20/2015   Hyperglycemia 12/21/2014   Mastodynia, female 11/30/2014   S/P excision of fibroadenoma of breast 11/30/2014   Angioedema of  lips 04/07/2014   Fibroadenoma of right breast 03/07/2014   Pelvic pain in female 06/19/2012   Menorrhagia 06/11/2012   SUI (stress urinary incontinence, female) 06/11/2012   HTN (hypertension) 06/11/2012   IBS (irritable bowel syndrome) - diarrhea predominant 03/17/2012   MICROSCOPIC HEMATURIA 11/30/2010   BREAST PAIN, RIGHT 11/30/2010   BREAST MASS, RIGHT 01/12/2010   HYPERCHOLESTEROLEMIA 12/07/2007   CIGARETTE SMOKER 12/07/2007   DEGENERATIVE JOINT DISEASE 12/07/2007   Low back pain with sciatica 12/07/2007   HEADACHE 12/07/2007   Obesity 08/03/2007   Anxiety state 08/03/2007    ONSET DATE: 12/05/22- referral date  REFERRING DIAG: G61.0 (ICD-10-CM) - Guillain Barr syndrome (HCC)  THERAPY DIAG:    Rationale for Evaluation and Treatment: Rehabilitation  SUBJECTIVE:   SUBJECTIVE STATEMENT: I'm kinda hurtin PERTINENT HISTORY: Pt is a 57 y/o female hospitalized 09/23/22  with progressive weakness and sensation changes, consistent with Guillain-Barr syndrome. MRI negative. LP results and assessment most consistent with Guillain-Barr syndrome.  Pt was d/c home from CIR 11/06/22 PMH includes: glaucoma anxiety, DJD, fibromyalgia, HTN, tobacco use, B-12 deficiency, OA, ACDF 2017. Home health OT/PT wrapped up last week.  PRECAUTIONS: Fall  WEIGHT BEARING RESTRICTIONS: no  PAIN:  Are you having pain? Yes: NPRS scale: 4/10 Pain location: left leg Pain description: aching Aggravating factors: exercise Relieving factors: meds     FALLS: Has patient fallen in last 6 months? No  LIVING ENVIRONMENT: Lives with: lives with their spouse Lives in: House/apartment Stairs: has ramp built Has following equipment at home:   BSC, steadi,  tub bench but can't access bathroom   PLOF: Independent  PATIENT GOALS: increase independence   OBJECTIVE:   HAND DOMINANCE: Right  ADLs: Overall ADLs: assistance required, unable to access BR Transfers/ambulation related to ADLs: Eating:  unable to cut food Grooming: set up UB Dressing: set up for bra and shirt LB Dressing: max A Toileting: uses BSC uses steadi Bathing: min-mod A in hospital  bed  Tub Shower transfers: n/a Equipment: Emergency planning/management officer  IADLs:dependent for IADLS   MOBILITY STATUS:  uses w/c  , per PT report minguard for transfer from w/c to mat    ACTIVITY TOLERANCE: Activity tolerance: Pt fatigues quickly    UPPER EXTREMITY ROM:    Active ROM Right eval Left eval  Shoulder flexion 120 100  Shoulder abduction 90 90  Shoulder adduction    Shoulder extension    Shoulder internal rotation    Shoulder external rotation    Elbow flexion    Elbow extension  -25  Wrist flexion    Wrist extension    Wrist ulnar deviation    Wrist radial deviation    Wrist pronation    Wrist supination    (Blank rows = not tested)  UPPER EXTREMITY MMT:  MMT Right eval Left eval  Shoulder flexion 3+/5 3-/5  Shoulder abduction    Shoulder adduction    Shoulder extension    Shoulder internal rotation    Shoulder external rotation    Middle trapezius    Lower trapezius    Elbow flexion 3+/5 3/5  Elbow extension 3/5 3/5  Wrist flexion    Wrist extension    Wrist ulnar deviation    Wrist radial deviation    Wrist pronation    Wrist supination    (Blank rows = not tested)  HAND FUNCTION: Grip strength: Right: 32 lbs; Left: 18 lbs  COORDINATION: 9 Hole Peg test: Right: 1 min 48 sec; Left: 4 pegs in 2 mins  Box and Blocks:  Right 35 blocks, Left  26 blocks  SENSATION: Light touch: WFL Hot/Cold: Not tested    COGNITION: Overall cognitive status:NT   VISION: Subjective report: reports hx of glaucoma VISION ASSESSMENT: Not tested  OBSERVATIONS: Pt is eager to improve, pt's husband is very supportive   TODAY'S TREATMENT:                                                                                                                              DATE: 4/29/24Arm bike level 3 for  conditioning x 8 mins  Pt reports she is anxious to get into her bathroom at home and she wants to try.  Therapist encouraged pt to have a family member come in for training prior to attempting and therapsits recommends that pt builds her endurance a bit more prior to attempting. Standing at countertop to remove cones form overhead shelf and replace. Initally with RUE then pt progressed to LUE. Pt required rest breaks after each step of task. Pt stood for several mins at a time then required rest break, minguad assist provided for safety. Therapist reviewed red theraband HEP with patient, 15 reps each, min v.c  01/09/23-Arm bike level 2 for conditioning x 8 mins  Placing/ removing large plastic pegs into pegboard with LUE for increased fine motor coordination, min-mod difficulty and drops, v.c to slow down and watch her hand. Placing graded clothespins-1-8#  on vertical antennae with RUE and left UE, removing with LUE for sustained pinch. Gripper set at level 1 to pick up 1 inch blocks with LUE for sustained grip, min difficulty/ v.c, then using RUE to pick up 1 inch blocks with RUE for sustained grip, min difficulty/ v.c    12/31/22- placing and removing wooden dowel pegs with LUE, pt was able to perform with good accuracy, no drops. Pt still has near absent sensation in bilateral UE's. Pt reports frying french fries at home and she did ok. Pt was cautioned due to absent sensation that she is at increased risk for burns. Therapist recommends pt performs cold sandwich/ snack prep initially. Functional reaching with left and right UE's to place and remove graded clothespins from vertical antennae, for sustained pinch, mod-max difficulty with LUE,  min-mod difficulty with RUE 1-8# pinch. Folding towels with bilateral UE's with good control. Pt was encouraged to fold laundry at home. Pt reports she has yellow putty at home. Pt was encouraged to continue working on grip and sustained pinch exercises with her  putty.   12/24/22- Seated in w/c reviewed closed chain shoulder flexion and chest press then added shoulder abduction exercises 10 reps each, min v.c Pt transferred to mat scoot pivot with min guard assist and min v.c  Seated edge of mat shoulder flexion , biceps curls and chest press with 1 lb weight in each UE for increased strength and core control, min v.c Pt transitioned to supine with min A. Shoulder flexion and chest press with 3 lbs bar in supine, pt's L arm was noted to fatigues more quickly than right. Supine to sit  with min A. Pt transferred scoot pivot back to w/c with min A. Arm bike x 5 mins level 1 for conditioning. 1 rest break required.    12/17/22-Pt was instructed in coordination HEP, pt demonstrates min difficulty with RUE with most exercises , mod difficulty with left for most exercises, however mod difficulty dealing cards with RUE, min v.c for performance. Shelf liner used for coins for increased ease. Cane exercises for chest press x 20 and shoulder flexion x 10 reps min v.c to avoid compensation.   PATIENT EDUCATION: Education details: see above Person educated: Patient Education method: explanation, demonstration Education comprehension: verbalized understanding, returned demonstration, and verbal cues required   HOME EXERCISE PROGRAM:  12/17/22- coordination HEP, cane exercises seated   GOALS: Goals reviewed with patient? Yes  SHORT TERM GOALS: Target date: 01/11/23  I with initial HEP Baseline: Goal status: ongoing 12/31/22  2.  Pt will demonstrate improved UE functional use as evidenced increasing bilateral box/ blocks score by 4 blocks.  Baseline: R 35, L 26 Goal status  met 12/31/22 RUE :43:  LUE 35    3.  Pt will donn pants with mod A. Baseline: max A Goal status: ongoing  4. Pt will verbalize understanding of adapted strategies to maximize safety and I with ADLs/ IADLs .   Goal status:  ongoing  5.  Pt will demonstrate ability to stand for  functional activity with min A x 10 mins in prep for ADLs.  Goal status: ongoing  6.  Pt will perform w/c to [facility] transfers mod I  Goal status:  ongoing 01/02/23  LONG TERM GOALS: Target date: 03/06/23  I with updated HEP  Goal status: INITIAL  2.  Pt will perform LB dressing mod I. Baseline:max A Goal status: INITIAL  3.  Pt will bathe with supervision/ set up. Baseline: min-mod A for sponge bath Goal status: INITIAL  4.  Pt will perform basic cooking at a w/c level mod I  Goal status: ongoing pt reports cooking french fries mod I. Pt was cautioned at this time due to   5.  Pt will demonstrate improved fine motor coordination as evidenced by performing LUE 9 hole peg test in 2 mins or less.   Goal status: INITIAL  6.  Pt will demonstrate improved fine motor coordination as evidenced by performing RUE 9 hole peg test in 1 mins  30 secs or less.  Baseline: RUE  1 min 48 secs, LUE 4 pegs placed in 2 mins  Goal status: INITIAL  ASSESSMENT:  CLINICAL IMPRESSION: Pt is progressing towards goals. She demonstrates improving standing balance today at countertop. PERFORMANCE DEFICITS: in functional skills  including ADLs, IADLs, coordination, dexterity, sensation, ROM, strength, flexibility, Fine motor control, Gross motor control, mobility, balance, endurance, decreased knowledge of precautions, decreased knowledge of use of DME, and UE functional use, cognitive skills including  and psychosocial skills including coping strategies, environmental adaptation, habits, interpersonal interactions, and routines and behaviors.   IMPAIRMENTS: are limiting patient from ADLs, IADLs, rest and sleep, play, leisure, and social participation.   CO-MORBIDITIES: may have co-morbidities  that affects occupational performance. Patient will benefit from skilled OT to address above impairments and improve overall function.  MODIFICATION OR ASSISTANCE TO COMPLETE EVALUATION: No modification of tasks or  assist necessary to complete an evaluation.  OT OCCUPATIONAL PROFILE AND HISTORY: Detailed assessment: Review of records and additional review of physical, cognitive, psychosocial history related to current functional performance.  CLINICAL DECISION MAKING: LOW - limited treatment options, no task modification necessary  REHAB POTENTIAL: Good  EVALUATION COMPLEXITY: Low    PLAN:  OT FREQUENCY: 2x/week  OT DURATION: 12 weeks  PLANNED INTERVENTIONS: self care/ADL training, therapeutic exercise, therapeutic activity, neuromuscular re-education, manual therapy, passive range of motion, gait training, balance training, stair training, functional mobility training, aquatic therapy, ultrasound, paraffin, moist heat, cryotherapy, patient/family education, energy conservation, coping strategies training, DME and/or AE instructions, and Re-evaluation  RECOMMENDED OTHER SERVICES: PT  CONSULTED AND AGREED WITH PLAN OF CARE: Patient and family member/caregiver  PLAN FOR NEXT SESSION:  continue ADL strategies, fine motor coordination, UE strength and endurance  Imre Vecchione, OT 01/14/2023, 8:41 AM

## 2023-01-16 ENCOUNTER — Ambulatory Visit: Payer: 59 | Admitting: Occupational Therapy

## 2023-01-16 ENCOUNTER — Ambulatory Visit: Payer: 59 | Attending: Family Medicine | Admitting: Physical Therapy

## 2023-01-16 ENCOUNTER — Ambulatory Visit: Payer: Medicare Other | Admitting: Podiatry

## 2023-01-16 ENCOUNTER — Encounter: Payer: Self-pay | Admitting: Physical Therapy

## 2023-01-16 DIAGNOSIS — M6281 Muscle weakness (generalized): Secondary | ICD-10-CM | POA: Diagnosis present

## 2023-01-16 DIAGNOSIS — R2681 Unsteadiness on feet: Secondary | ICD-10-CM

## 2023-01-16 DIAGNOSIS — R2689 Other abnormalities of gait and mobility: Secondary | ICD-10-CM

## 2023-01-16 DIAGNOSIS — R278 Other lack of coordination: Secondary | ICD-10-CM

## 2023-01-16 DIAGNOSIS — R262 Difficulty in walking, not elsewhere classified: Secondary | ICD-10-CM | POA: Diagnosis present

## 2023-01-16 DIAGNOSIS — G610 Guillain-Barre syndrome: Secondary | ICD-10-CM
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
## 2023-01-16 NOTE — Therapy (Signed)
OUTPATIENT PHYSICAL THERAPY LOWER EXTREMITY TREATMENT   Patient Name: Dana [name] MRN: [id number] DOB:March 16, 1966, 57 y.o., female Today's Date: 01/16/2023  END OF SESSION:  PT End of Session - 01/16/23 1023     Visit Number 6    Date for PT Re-Evaluation 03/06/23    PT Start Time 1016    PT Stop Time 1055    PT Time Calculation (min) 39 min    Equipment Utilized During Treatment Gait belt    Activity Tolerance Patient tolerated treatment well    Behavior During Therapy WFL for tasks assessed/performed              Past Medical History:  Diagnosis Date   Anxiety    Back pain with radiation    Breast mass, right    Chronic pain    Chronic, continuous use of opioids    Complication of anesthesia 04/07/14   Allergic reaction to Lisinopril immediately following surgery   DJD (degenerative joint disease)    Fibromyalgia    Groin abscess    Headache(784.0)    History of IBS    Hypercholesterolemia    IBS (irritable bowel syndrome)    Lactose intolerance    Mild hypertension    Obesity    Tobacco use disorder    Umbilical hernia    Symptomatic   Past Surgical History:  Procedure Laterality Date   ABDOMINAL HYSTERECTOMY     ANTERIOR CERVICAL DECOMP/DISCECTOMY FUSION N/A 12/20/2015   Procedure: ANTERIOR CERVICAL DECOMPRESSION FUSION CERVICAL 4-5, CERVICAL 5-6, CERVICAL 6-7 WITH INSTRUMENTATION AND ALLOGRAFT;  Surgeon: Estill Bamberg, MD;  Location: [facility] OR;  Service: Orthopedics;  Laterality: N/A;  Anterior cervical decompression fusion, cervical 4-5, cervical 5-6, cervical 6-7 with instrumentation and allograft   BACK SURGERY     BILATERAL SALPINGECTOMY  09/03/2012   Procedure: BILATERAL SALPINGECTOMY;  Surgeon: Ok Edwards, MD;  Location: [facility];  Service: Gynecology;  Laterality: Bilateral;   BREAST BIOPSY Right 04/07/2014   Procedure: REMOVAL RIGHT BREAST MASS WITH WIRE LOCALIZATION;  Surgeon: Adolph Pollack, MD;  Location: [facility];  Service: General;   Laterality: Right;   BREAST EXCISIONAL BIOPSY Left    x2   BREAST LUMPECTOMY     x2   CARPAL TUNNEL RELEASE Left 05/11/2019   Procedure: LEFT CARPAL TUNNEL RELEASE, RIGHT TENNIS ELBOW MARCAINE/DEPO MEDROL INJECTION UNDER ANESTHESIA;  Surgeon: Kerrin Champagne, MD;  Location: [facility] OR;  Service: Orthopedics;  Laterality: Left;   COLONOSCOPY W/ BIOPSIES  04/24/2012   per Dr. Leone Payor, clear, repeat in 10 yrs    disectomy     ESOPHAGOGASTRODUODENOSCOPY     FINGER SURGERY     Right index-excision of mass    FOOT SURGERY Right    Bone Spurs   LAPAROSCOPIC HYSTERECTOMY  09/03/2012   Procedure: HYSTERECTOMY TOTAL LAPAROSCOPIC;  Surgeon: Ok Edwards, MD;  Location: [facility];  Service: Gynecology;  Laterality: N/A;   LUMBAR DISC SURGERY     TUBAL LIGATION     UMBILICAL HERNIA REPAIR N/A 05/01/2018   Procedure: UMBILICAL HERNIA REPAIR;  Surgeon: Jimmye Norman, MD;  Location: [facility] OR;  Service: General;  Laterality: N/A;   Patient Active Problem List   Diagnosis Date Noted   Wheelchair dependence 12/30/2022   Nerve pain 12/30/2022   Insomnia due to medical condition 12/30/2022   Guillain Barr syndrome (HCC) 10/01/2022   UTI (urinary tract infection) 09/24/2022   Hypomagnesemia 09/24/2022   Debility 09/23/2022   Hypokalemia 09/23/2022   B12  deficiency 09/05/2022   Folate deficiency 09/05/2022   Carpal tunnel syndrome, left upper limb 05/11/2019    Class: Chronic   Spinal stenosis of lumbar region with neurogenic claudication 08/20/2018   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   Primary osteoarthritis of right knee 02/27/2017   Right tennis elbow 11/11/2016   Muscle cramps 01/15/2016   Bilateral leg edema 01/08/2016   Radiculopathy 12/20/2015   Hyperglycemia 12/21/2014   Mastodynia, female 11/30/2014   S/P excision of fibroadenoma of breast 11/30/2014   Angioedema of lips 04/07/2014   Fibroadenoma of right breast 03/07/2014   Pelvic pain in female 06/19/2012   Menorrhagia 06/11/2012    SUI (stress urinary incontinence, female) 06/11/2012   HTN (hypertension) 06/11/2012   IBS (irritable bowel syndrome) - diarrhea predominant 03/17/2012   MICROSCOPIC HEMATURIA 11/30/2010   BREAST PAIN, RIGHT 11/30/2010   BREAST MASS, RIGHT 01/12/2010   HYPERCHOLESTEROLEMIA 12/07/2007   CIGARETTE SMOKER 12/07/2007   DEGENERATIVE JOINT DISEASE 12/07/2007   Low back pain with sciatica 12/07/2007   HEADACHE 12/07/2007   Obesity 08/03/2007   Anxiety state 08/03/2007    PCP: Nelwyn Salisbury, MD  REFERRING PROVIDER: Nelwyn Salisbury, MD  REFERRING DIAG: G61.0 (ICD-10-CM) - Guillain Barr syndrome [facility])   THERAPY DIAG:  Other lack of coordination  Muscle weakness (generalized)  Unsteadiness on feet  Difficulty in walking, not elsewhere classified  Guillain Barr syndrome (HCC)  Other abnormalities of gait and mobility  Rationale for Evaluation and Treatment: Rehabilitation  ONSET DATE: 12/05/22  SUBJECTIVE:   SUBJECTIVE STATEMENT: Patient reports that was very sore after last treatment. She had an accident in her W/C, but did not fall out.  OBJECTIVE STATEMENT: Dana [name] is a 57 y.o. female who presented to the [facility] ED on 09/23/2022 with bilateral lower extremity weakness with decreased mobility that has progressed to both arms. She also reported numbness and tingling of the extremities. She was transferred to [facility] for MRI. 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  Inpatient Rehab F/B HHPT.  R knee injection 10/08/22 trigger point injections for worsening back pain 2/15  RA  PAIN:  Are you having pain? Yes: NPRS scale: up to 10/10 Pain location: all over, but her R knee pain is worst,  Pain description: Sharp Aggravating factors: Has difficulty standing up Relieving factors: Medicine helps somewhat  PRECAUTIONS: Fall  WEIGHT BEARING RESTRICTIONS: No  FALLS:  Has patient fallen in last 6 months? No  LIVING ENVIRONMENT: Lives with:  lives with their family Lives in: House/apartment Stairs: Nohas a ramp Has following equipment at home: Environmental consultant - 2 wheeled, Wheelchair (power), Tour manager, bed side commode, Ramped entry, and [employer] lift  OCCUPATION: on disability due to RA and a fall several years ago  PLOF: Independent  PATIENT GOALS: walk  NEXT MD VISIT: She is scheduled to follow up with Dr. Serita Sheller with Physical Med/Rehab on 12-30-22. She is scheduled to follow up with Dr. Levert Feinstein at Neurology on 01-28-23.   OBJECTIVE:   DIAGNOSTIC FINDINGS:  Right knee radiographs 11/09/2020   FINDINGS: Severe patellofemoral joint space narrowing. Severe superior and mild inferior patellar degenerative osteophytosis. Moderate superior trochlear degenerative osteophytosis. No joint effusion. Severe medial compartment joint space narrowing with moderate peripheral medial and lateral compartment degenerative osteophytes.  COGNITION: Overall cognitive status: Within functional limits for tasks assessed     SENSATION: Light touch: Impaired  and B hands and feet and lower legs  EDEMA:  BLE edema noted.  MUSCLE  LENGTH: Hamstrings: WFL Thomas test: B tightness, to neutral  POSTURE: rounded shoulders and flexed trunk   PALPATION: No TTP  LOWER EXTREMITY ROM: BLE ROM limited in all planes an all joints due to body habitus   LOWER EXTREMITY MMT:  MMT Right eval Left eval  Hip flexion 3 3-  Hip extension 3- 3-  Hip abduction 3 3-  Hip adduction    Hip internal rotation    Hip external rotation    Knee flexion 4 4-  Knee extension 4- 4-  Ankle dorsiflexion 4- 4-  Ankle plantarflexion    Ankle inversion 4- 4-  Ankle eversion 4- 4-   (Blank rows = not tested)  FUNCTIONAL TESTS:  5 times sit to stand: unable to complete Timed up and go (TUG): unable to complete  Bed Mobility: Occasional min A for sup <> sit, rolled B on mat with great effort, light min A  TRANSFERS: Scooting transfer with CGA, stand  pivot with CGA, she reports that she sometimes has difficulty rising due to her knee pain  GAIT: Distance walked: 0' Assistive device utilized:  shopping cart-will bring her RW from home. Level of assistance: Min A Comments: Stood and weight shifted, then stood again and took steps to turn to W/C.  TODAY'S TREATMENT:                                                                                                                              DATE:  01/16/23 NuStep L5 x 6 minutes then L7 legs only x 2 minutes. Seated shoulder ext and rows with 10#, 2 x 10 reps each Seated chest press against 5# x 10 reps Scooting transfers to each side with S. Seated rotation to R, slide hands out, lift L leg onto the mat, then return. 3 x each direction. Ambulation x 8 steps with RW and min A. She had more difficulty stepping with LLE due to R knee pain today.  01/14/23 Mat work to activate trunk and LE-Rolling to L side lie and back with R arm and leg lifted off mat. Then rolled to L side and performed R hip abd with forward and back slow swing. Repeated to R, had increased difficulty lifting LLE off mat-performed lower body ant/post shifts, then repeated with improved control noted. NuStep L3 x 6 min, then L8 x 2 minutes, legs only. Performed stand pivot transfers x 2 using RW Ambulated x 8 steps with RW and CGA, following with W/C, fearful, but stable.  01/09/23 Nustep level 4 x 6 minutes 5# rows on machine pulleys 2x10 5# straight arm pulls 2x10 5# chest press pulleys 2x10 Ball isometric abs 2x10 Standing in walker x 45 seconds cues for posture and knees then again for one minute with weight shifts and small marches HS stretches in sitting   LAQ 1# right, 2.5# left 3x10 Seated hip abduction Marching 1# right, no weight left 3x10 Ball b/n knees squeeze 3x10 Red  tband HS curls Standing with walker march weight shift and standing x 1 minute  01/02/23 Seated long kicks and hip flexion 2 x 10 reps  each. Seated heel raise/toe raise 2 x 10 reps Facing mat with seat of W/C elevated, sit to stand, CGA, x 3 Scoot transfer to NuStep, S NuStep L3 x 4 minutes, then 20 sec at L5 with legs only Stand pivot transfer to W/C. Min A. Initiatlly fearful and unable ot step with LLE. Improved on second attempt, VC for upright posture which helped. Seated rotation to L, slide hands out to the side and back, 2 x 5 in each direction Stand pivot to W/C with RW and min A.  12/31/22 LAQ 2x10 Seated marching 20 reps  Seated heel to toe raises 2x10 HS curls 2x10 red  Ball squeezes 2x10  Hip abd with band green 2x10  Mini squats in // bars x5 1 step forward and back in // bars   12/12/22  Education  PATIENT EDUCATION:  Education details: POC Person educated: Patient Education method: Explanation Education comprehension: verbalized understanding  HOME EXERCISE PROGRAM: TBD  ASSESSMENT:  CLINICAL IMPRESSION: Patient reports soreness after last treatment, but it resolved within a day. Performed trunk stability activities followed by standing and gait. Tolerated increased resistance with no C/O.  OBJECTIVE IMPAIRMENTS: Abnormal gait, decreased activity tolerance, decreased balance, decreased coordination, decreased endurance, difficulty walking, decreased ROM, decreased strength, and postural dysfunction.   ACTIVITY LIMITATIONS: carrying, lifting, bending, standing, squatting, stairs, transfers, and locomotion level  PARTICIPATION LIMITATIONS: meal prep, cleaning, laundry, driving, and shopping  PERSONAL FACTORS: Fitness, Past/current experiences, and 1 comorbidity: Recently diagnosed with GBS  are also affecting patient's functional outcome.   REHAB POTENTIAL: Good  CLINICAL DECISION MAKING: Evolving/moderate complexity  EVALUATION COMPLEXITY: Moderate   GOALS: Goals reviewed with patient? Yes  SHORT TERM GOALS: Target date: 11/28/22 I with initial HEP Baseline: Goal status: met  01/09/23  LONG TERM GOALS: Target date: 03/06/23  I with final HEP Baseline:  Goal status: INITIAL  2.  Decrease 5x STS to < 14 sec Baseline: unable to complete Goal status: 01/02/23-Initiated strengthening  3.  Decrease TUG to < 20 sec Baseline: Unable to complete. Goal status: INITIAL  4.  Patient will ambulate at least 300' with LRAD, MI, on level and unlevel surfaces. Baseline: Stand pivot transfer with CGA Goal status: INITIAL  5.  Perform all bed mobility with I Baseline: Occ min A on mat Goal status: INITIAL  6.  Increase BLE strength to at least 4/5 throughout. Baseline: (3-)-3/5 Goal status: INITIAL  PLAN:  PT FREQUENCY: 1-2x/week  PT DURATION: 12 weeks  PLANNED INTERVENTIONS: Therapeutic exercises, Therapeutic activity, Neuromuscular re-education, Balance training, Gait training, Patient/Family education, Self Care, Joint mobilization, Stair training, Dry Needling, Electrical stimulation, Cryotherapy, Moist heat, Ultrasound, Ionotophoresis 4mg /ml Dexamethasone, and Manual therapy  PLAN FOR NEXT SESSION: continue to progress as tolerated. 01/16/23-car transfers with husband when he can make it in. Start HEP.  Oley Balm DPT 01/16/23 10:58 AM

## 2023-01-21 ENCOUNTER — Ambulatory Visit: Payer: 59 | Admitting: Physical Therapy

## 2023-01-21 ENCOUNTER — Ambulatory Visit (INDEPENDENT_AMBULATORY_CARE_PROVIDER_SITE_OTHER): Payer: 59 | Admitting: Podiatry

## 2023-01-21 ENCOUNTER — Other Ambulatory Visit: Payer: Self-pay | Admitting: Physical Medicine and Rehabilitation

## 2023-01-21 ENCOUNTER — Encounter: Payer: Self-pay | Admitting: Podiatry

## 2023-01-21 ENCOUNTER — Ambulatory Visit: Payer: 59 | Admitting: Occupational Therapy

## 2023-01-21 ENCOUNTER — Encounter: Payer: Self-pay | Admitting: Physical Therapy

## 2023-01-21 DIAGNOSIS — R262 Difficulty in walking, not elsewhere classified: Secondary | ICD-10-CM

## 2023-01-21 DIAGNOSIS — M79674 Pain in right toe(s): Secondary | ICD-10-CM

## 2023-01-21 DIAGNOSIS — R278 Other lack of coordination: Secondary | ICD-10-CM

## 2023-01-21 DIAGNOSIS — R2681 Unsteadiness on feet: Secondary | ICD-10-CM

## 2023-01-21 DIAGNOSIS — B351 Tinea unguium: Secondary | ICD-10-CM | POA: Diagnosis not present

## 2023-01-21 DIAGNOSIS — G610 Guillain-Barre syndrome: Secondary | ICD-10-CM

## 2023-01-21 DIAGNOSIS — R2689 Other abnormalities of gait and mobility: Secondary | ICD-10-CM

## 2023-01-21 DIAGNOSIS — M6281 Muscle weakness (generalized): Secondary | ICD-10-CM

## 2023-01-21 DIAGNOSIS — M79675 Pain in left toe(s): Secondary | ICD-10-CM

## 2023-01-21 NOTE — Progress Notes (Signed)
This patient presents to the office with chief complaint of long thick painful nails.  Patient says the nails are painful walking and wearing shoes.  This patient is unable to self treat.  This patient is unable to trim her nails since she is unable to reach her nails.   She says she has Guillan-Barre syndrome and was hospitalized for 2 weeks. She presents to the office in her motorized wheelchair.  She presents to the office for preventative foot care services.  General Appearance  Alert, conversant and in no acute stress.  Vascular  Dorsalis pedis and posterior tibial  pulses are  not palpable due to swelling  bilaterally.  Capillary return is within normal limits  bilaterally. Temperature is within normal limits  bilaterally.  Neurologic  Senn-Weinstein monofilament wire test within normal limits  bilaterally. Muscle power within normal limits bilaterally.  Nails Thick disfigured discolored nails with subungual debris  from hallux to fifth toes bilaterally. No evidence of bacterial infection or drainage bilaterally.  Orthopedic  No limitations of motion  feet .  No crepitus or effusions noted.  No bony pathology or digital deformities noted.  Skin  normotropic skin with no porokeratosis noted bilaterally.  No signs of infections or ulcers noted.     Onychomycosis  Nails  B/L.  Pain in right toes  Pain in left toes  Debridement of nails both feet followed trimming the nails with dremel tool.    RTC 3 months.   Helane Gunther DPM

## 2023-01-21 NOTE — Therapy (Signed)
OUTPATIENT PHYSICAL THERAPY LOWER EXTREMITY TREATMENT   Patient Name: Dana [name] MRN: [id number] DOB:March 04, 1966, 57 y.o., female Today's Date: 01/21/2023  END OF SESSION:  PT End of Session - 01/21/23 1012     Visit Number 7    Date for PT Re-Evaluation 03/06/23    PT Start Time 0930    PT Stop Time 1010    PT Time Calculation (min) 40 min    Equipment Utilized During Treatment Gait belt    Activity Tolerance Patient tolerated treatment well    Behavior During Therapy WFL for tasks assessed/performed              Past Medical History:  Diagnosis Date   Anxiety    Back pain with radiation    Breast mass, right    Chronic pain    Chronic, continuous use of opioids    Complication of anesthesia 04/07/14   Allergic reaction to Lisinopril immediately following surgery   DJD (degenerative joint disease)    Fibromyalgia    Groin abscess    Headache(784.0)    History of IBS    Hypercholesterolemia    IBS (irritable bowel syndrome)    Lactose intolerance    Mild hypertension    Obesity    Tobacco use disorder    Umbilical hernia    Symptomatic   Past Surgical History:  Procedure Laterality Date   ABDOMINAL HYSTERECTOMY     ANTERIOR CERVICAL DECOMP/DISCECTOMY FUSION N/A 12/20/2015   Procedure: ANTERIOR CERVICAL DECOMPRESSION FUSION CERVICAL 4-5, CERVICAL 5-6, CERVICAL 6-7 WITH INSTRUMENTATION AND ALLOGRAFT;  Surgeon: Estill Bamberg, MD;  Location: [facility] OR;  Service: Orthopedics;  Laterality: N/A;  Anterior cervical decompression fusion, cervical 4-5, cervical 5-6, cervical 6-7 with instrumentation and allograft   BACK SURGERY     BILATERAL SALPINGECTOMY  09/03/2012   Procedure: BILATERAL SALPINGECTOMY;  Surgeon: Ok Edwards, MD;  Location: [facility] ORS;  Service: Gynecology;  Laterality: Bilateral;   BREAST BIOPSY Right 04/07/2014   Procedure: REMOVAL RIGHT BREAST MASS WITH WIRE LOCALIZATION;  Surgeon: Adolph Pollack, MD;  Location: [facility] OR;  Service: General;   Laterality: Right;   BREAST EXCISIONAL BIOPSY Left    x2   BREAST LUMPECTOMY     x2   CARPAL TUNNEL RELEASE Left 05/11/2019   Procedure: LEFT CARPAL TUNNEL RELEASE, RIGHT TENNIS ELBOW MARCAINE/DEPO MEDROL INJECTION UNDER ANESTHESIA;  Surgeon: Kerrin Champagne, MD;  Location: [facility] OR;  Service: Orthopedics;  Laterality: Left;   COLONOSCOPY W/ BIOPSIES  04/24/2012   per Dr. Leone Payor, clear, repeat in 10 yrs    disectomy     ESOPHAGOGASTRODUODENOSCOPY     FINGER SURGERY     Right index-excision of mass    FOOT SURGERY Right    Bone Spurs   LAPAROSCOPIC HYSTERECTOMY  09/03/2012   Procedure: HYSTERECTOMY TOTAL LAPAROSCOPIC;  Surgeon: Ok Edwards, MD;  Location: [facility] OR[facility];  Service: Gynecology;  Laterality: N/A;   LUMBAR DISC SURGERY     TUBAL LIGATION     UMBILICAL HERNIA REPAIR N/A 05/01/2018   Procedure: UMBILICAL HERNIA REPAIR;  Surgeon: Jimmye Norman, MD;  Location: [facility] OR;  Service: General;  Laterality: N/A;   Patient Active Problem List   Diagnosis Date Noted   Wheelchair dependence 12/30/2022   Nerve pain 12/30/2022   Insomnia due to medical condition 12/30/2022   Guillain Barr syndrome (HCC) 10/01/2022   UTI (urinary tract infection) 09/24/2022   Hypomagnesemia 09/24/2022   Debility 09/23/2022   Hypokalemia 09/23/2022   B12  deficiency 09/05/2022   Folate deficiency 09/05/2022   Carpal tunnel syndrome, left upper limb 05/11/2019    Class: Chronic   Spinal stenosis of lumbar region with neurogenic claudication 08/20/2018   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   Primary osteoarthritis of right knee 02/27/2017   Right tennis elbow 11/11/2016   Muscle cramps 01/15/2016   Bilateral leg edema 01/08/2016   Radiculopathy 12/20/2015   Hyperglycemia 12/21/2014   Mastodynia, female 11/30/2014   S/P excision of fibroadenoma of breast 11/30/2014   Angioedema of lips 04/07/2014   Fibroadenoma of right breast 03/07/2014   Pelvic pain in female 06/19/2012   Menorrhagia 06/11/2012    SUI (stress urinary incontinence, female) 06/11/2012   HTN (hypertension) 06/11/2012   IBS (irritable bowel syndrome) - diarrhea predominant 03/17/2012   MICROSCOPIC HEMATURIA 11/30/2010   BREAST PAIN, RIGHT 11/30/2010   BREAST MASS, RIGHT 01/12/2010   HYPERCHOLESTEROLEMIA 12/07/2007   CIGARETTE SMOKER 12/07/2007   DEGENERATIVE JOINT DISEASE 12/07/2007   Low back pain with sciatica 12/07/2007   HEADACHE 12/07/2007   Obesity 08/03/2007   Anxiety state 08/03/2007    PCP: Nelwyn Salisbury, MD  REFERRING PROVIDER: Nelwyn Salisbury, MD  REFERRING DIAG: G61.0 (ICD-10-CM) - Guillain Barr syndrome [facility])   THERAPY DIAG:  Other lack of coordination  Muscle weakness (generalized)  Unsteadiness on feet  Other abnormalities of gait and mobility  Difficulty in walking, not elsewhere classified  Guillain Barr syndrome [facility])  Rationale for Evaluation and Treatment: Rehabilitation  ONSET DATE: 12/05/22  SUBJECTIVE:   SUBJECTIVE STATEMENT: Patient reports that was very sore after last treatment. She had an accident in her W/C, but did not fall out.  OBJECTIVE STATEMENT: Dana [name] is a 57 y.o. female who presented to the [facility] ED on 09/23/2022 with bilateral lower extremity weakness with decreased mobility that has progressed to both arms. She also reported numbness and tingling of the extremities. She was transferred to [facility] for MRI. 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  Inpatient Rehab F/B HHPT.  R knee injection 10/08/22 trigger point injections for worsening back pain 2/15  RA  PAIN:  Are you having pain? Yes: NPRS scale: up to 10/10 Pain location: all over, but her R knee pain is worst,  Pain description: Sharp Aggravating factors: Has difficulty standing up Relieving factors: Medicine helps somewhat  PRECAUTIONS: Fall  WEIGHT BEARING RESTRICTIONS: No  FALLS:  Has patient fallen in last 6 months? No  LIVING ENVIRONMENT: Lives with:  lives with their family Lives in: House/apartment Stairs: Nohas a ramp Has following equipment at home: Environmental consultant - 2 wheeled, Wheelchair (power), Tour manager, bed side commode, Ramped entry, and [employer] lift  OCCUPATION: on disability due to RA and a fall several years ago  PLOF: Independent  PATIENT GOALS: walk  NEXT MD VISIT: She is scheduled to follow up with Dr. Serita Sheller with Physical Med/Rehab on 12-30-22. She is scheduled to follow up with Dr. Levert Feinstein at Neurology on 01-28-23.   OBJECTIVE:   DIAGNOSTIC FINDINGS:  Right knee radiographs 11/09/2020   FINDINGS: Severe patellofemoral joint space narrowing. Severe superior and mild inferior patellar degenerative osteophytosis. Moderate superior trochlear degenerative osteophytosis. No joint effusion. Severe medial compartment joint space narrowing with moderate peripheral medial and lateral compartment degenerative osteophytes.  COGNITION: Overall cognitive status: Within functional limits for tasks assessed     SENSATION: Light touch: Impaired  and B hands and feet and lower legs  EDEMA:  BLE edema noted.  MUSCLE  LENGTH: Hamstrings: WFL Thomas test: B tightness, to neutral  POSTURE: rounded shoulders and flexed trunk   PALPATION: No TTP  LOWER EXTREMITY ROM: BLE ROM limited in all planes an all joints due to body habitus   LOWER EXTREMITY MMT:  MMT Right eval Left eval  Hip flexion 3 3-  Hip extension 3- 3-  Hip abduction 3 3-  Hip adduction    Hip internal rotation    Hip external rotation    Knee flexion 4 4-  Knee extension 4- 4-  Ankle dorsiflexion 4- 4-  Ankle plantarflexion    Ankle inversion 4- 4-  Ankle eversion 4- 4-   (Blank rows = not tested)  FUNCTIONAL TESTS:  5 times sit to stand: unable to complete Timed up and go (TUG): unable to complete  Bed Mobility: Occasional min A for sup <> sit, rolled B on mat with great effort, light min A  TRANSFERS: Scooting transfer with CGA, stand  pivot with CGA, she reports that she sometimes has difficulty rising due to her knee pain  GAIT: Distance walked: 0' Assistive device utilized:  shopping cart-will bring her RW from home. Level of assistance: Min A Comments: Stood and weight shifted, then stood again and took steps to turn to W/C.  TODAY'S TREATMENT:                                                                                                                              DATE:  01/21/23 B (15) and then single limb (2 x 10) pushes on Fitter Seated shoulder ext, rows, trunk rotation, all with G tband resistance, 10 reps each. Parallel bars, standing weight shifts, Heel raises x 10, sliding B hands on rail Single steps forward and back with alternating legs, RLE had partial buckle, required min A for stability, with a lot of pain for patient.  01/16/23 NuStep L5 x 6 minutes then L7 legs only x 2 minutes. Seated shoulder ext and rows with 10#, 2 x 10 reps each Seated chest press against 5# x 10 reps Scooting transfers to each side with S. Seated rotation to R, slide hands out, lift L leg onto the mat, then return. 3 x each direction. Ambulation x 8 steps with RW and min A. She had more difficulty stepping with LLE due to R knee pain today.  01/14/23 Mat work to activate trunk and LE-Rolling to L side lie and back with R arm and leg lifted off mat. Then rolled to L side and performed R hip abd with forward and back slow swing. Repeated to R, had increased difficulty lifting LLE off mat-performed lower body ant/post shifts, then repeated with improved control noted. NuStep L3 x 6 min, then L8 x 2 minutes, legs only. Performed stand pivot transfers x 2 using RW Ambulated x 8 steps with RW and CGA, following with W/C, fearful, but stable.  01/09/23 Nustep level 4 x 6 minutes 5# rows on machine  pulleys 2x10 5# straight arm pulls 2x10 5# chest press pulleys 2x10 Ball isometric abs 2x10 Standing in walker x 45 seconds cues for  posture and knees then again for one minute with weight shifts and small marches HS stretches in sitting   LAQ 1# right, 2.5# left 3x10 Seated hip abduction Marching 1# right, no weight left 3x10 Ball b/n knees squeeze 3x10 Red tband HS curls Standing with walker march weight shift and standing x 1 minute  01/02/23 Seated long kicks and hip flexion 2 x 10 reps each. Seated heel raise/toe raise 2 x 10 reps Facing mat with seat of W/C elevated, sit to stand, CGA, x 3 Scoot transfer to NuStep, S NuStep L3 x 4 minutes, then 20 sec at L5 with legs only Stand pivot transfer to W/C. Min A. Initiatlly fearful and unable ot step with LLE. Improved on second attempt, VC for upright posture which helped. Seated rotation to L, slide hands out to the side and back, 2 x 5 in each direction Stand pivot to W/C with RW and min A.  12/31/22 LAQ 2x10 Seated marching 20 reps  Seated heel to toe raises 2x10 HS curls 2x10 red  Ball squeezes 2x10  Hip abd with band green 2x10  Mini squats in // bars x5 1 step forward and back in // bars   12/12/22  Education  PATIENT EDUCATION:  Education details: POC Person educated: Patient Education method: Explanation Education comprehension: verbalized understanding  HOME EXERCISE PROGRAM: TBD  ASSESSMENT:  CLINICAL IMPRESSION: Patient reports no new issues. Observed being able to lift hips during transfers, with improved WB and knee control. Emphasized trunk stability today. She performed standing in parallel bars and attempted stepping, R knee did buckle at one pooint, but she tried again with success.  OBJECTIVE IMPAIRMENTS: Abnormal gait, decreased activity tolerance, decreased balance, decreased coordination, decreased endurance, difficulty walking, decreased ROM, decreased strength, and postural dysfunction.   ACTIVITY LIMITATIONS: carrying, lifting, bending, standing, squatting, stairs, transfers, and locomotion level  PARTICIPATION LIMITATIONS:  meal prep, cleaning, laundry, driving, and shopping  PERSONAL FACTORS: Fitness, Past/current experiences, and 1 comorbidity: Recently diagnosed with GBS  are also affecting patient's functional outcome.   REHAB POTENTIAL: Good  CLINICAL DECISION MAKING: Evolving/moderate complexity  EVALUATION COMPLEXITY: Moderate   GOALS: Goals reviewed with patient? Yes  SHORT TERM GOALS: Target date: 11/28/22 I with initial HEP Baseline: Goal status: met 01/09/23  LONG TERM GOALS: Target date: 03/06/23  I with final HEP Baseline:  Goal status: INITIAL  2.  Decrease 5x STS to < 14 sec Baseline: unable to complete Goal status: 01/02/23-Initiated strengthening  3.  Decrease TUG to < 20 sec Baseline: Unable to complete. Goal status: INITIAL  4.  Patient will ambulate at least 300' with LRAD, MI, on level and unlevel surfaces. Baseline: Stand pivot transfer with CGA Goal status: INITIAL  5.  Perform all bed mobility with I Baseline: Occ min A on mat Goal status: INITIAL  6.  Increase BLE strength to at least 4/5 throughout. Baseline: (3-)-3/5 Goal status: INITIAL  PLAN:  PT FREQUENCY: 1-2x/week  PT DURATION: 12 weeks  PLANNED INTERVENTIONS: Therapeutic exercises, Therapeutic activity, Neuromuscular re-education, Balance training, Gait training, Patient/Family education, Self Care, Joint mobilization, Stair training, Dry Needling, Electrical stimulation, Cryotherapy, Moist heat, Ultrasound, Ionotophoresis 4mg /ml Dexamethasone, and Manual therapy  PLAN FOR NEXT SESSION: continue to progress as tolerated. 01/16/23-car transfers with husband when he can make it in. Start HEP.  Oley Balm DPT 01/21/23 10:16 AM

## 2023-01-21 NOTE — Therapy (Signed)
OUTPATIENT OCCUPATIONAL THERAPY NEURO Treatment  Patient Name: Dana [name] MRN: [id number] DOB:Nov 16, 1965, 57 y.o., female Today's Date: 01/21/2023  PCP: Dr. Clent Ridges REFERRING PROVIDER: Dr. Gershon Crane  END OF SESSION:  OT End of Session - 01/21/23 0908     Visit Number 9    Number of Visits 25    Date for OT Re-Evaluation 03/06/23    Authorization Type UHC, Medicare    Authorization - Visit Number 9    Progress Note Due on Visit 10    OT Start Time [phone]    OT Stop Time 0930    OT Time Calculation (min) 39 min    Activity Tolerance Patient tolerated treatment well    Behavior During Therapy [facility] for tasks assessed/performed                Past Medical History:  Diagnosis Date   Anxiety    Back pain with radiation    Breast mass, right    Chronic pain    Chronic, continuous use of opioids    Complication of anesthesia 04/07/14   Allergic reaction to Lisinopril immediately following surgery   DJD (degenerative joint disease)    Fibromyalgia    Groin abscess    Headache(784.0)    History of IBS    Hypercholesterolemia    IBS (irritable bowel syndrome)    Lactose intolerance    Mild hypertension    Obesity    Tobacco use disorder    Umbilical hernia    Symptomatic   Past Surgical History:  Procedure Laterality Date   ABDOMINAL HYSTERECTOMY     ANTERIOR CERVICAL DECOMP/DISCECTOMY FUSION N/A 12/20/2015   Procedure: ANTERIOR CERVICAL DECOMPRESSION FUSION CERVICAL 4-5, CERVICAL 5-6, CERVICAL 6-7 WITH INSTRUMENTATION AND ALLOGRAFT;  Surgeon: Estill Bamberg, MD;  Location: [facility] OR;  Service: Orthopedics;  Laterality: N/A;  Anterior cervical decompression fusion, cervical 4-5, cervical 5-6, cervical 6-7 with instrumentation and allograft   BACK SURGERY     BILATERAL SALPINGECTOMY  09/03/2012   Procedure: BILATERAL SALPINGECTOMY;  Surgeon: Ok Edwards, MD;  Location: [facility] ORS;  Service: Gynecology;  Laterality: Bilateral;   BREAST BIOPSY Right 04/07/2014   Procedure:  REMOVAL RIGHT BREAST MASS WITH WIRE LOCALIZATION;  Surgeon: Adolph Pollack, MD;  Location: [facility] OR;  Service: General;  Laterality: Right;   BREAST EXCISIONAL BIOPSY Left    x2   BREAST LUMPECTOMY     x2   CARPAL TUNNEL RELEASE Left 05/11/2019   Procedure: LEFT CARPAL TUNNEL RELEASE, RIGHT TENNIS ELBOW MARCAINE/DEPO MEDROL INJECTION UNDER ANESTHESIA;  Surgeon: Kerrin Champagne, MD;  Location: [facility] OR;  Service: Orthopedics;  Laterality: Left;   COLONOSCOPY W/ BIOPSIES  04/24/2012   per Dr. Leone Payor, clear, repeat in 10 yrs    disectomy     ESOPHAGOGASTRODUODENOSCOPY     FINGER SURGERY     Right index-excision of mass    FOOT SURGERY Right    Bone Spurs   LAPAROSCOPIC HYSTERECTOMY  09/03/2012   Procedure: HYSTERECTOMY TOTAL LAPAROSCOPIC;  Surgeon: Ok Edwards, MD;  Location: [facility] ORS;  Service: Gynecology;  Laterality: N/A;   LUMBAR DISC SURGERY     TUBAL LIGATION     UMBILICAL HERNIA REPAIR N/A 05/01/2018   Procedure: UMBILICAL HERNIA REPAIR;  Surgeon: Jimmye Norman, MD;  Location: [facility] OR;  Service: General;  Laterality: N/A;   Patient Active Problem List   Diagnosis Date Noted   Wheelchair dependence 12/30/2022   Nerve pain 12/30/2022   Insomnia due to medical  condition 12/30/2022   Guillain Barr syndrome (HCC) 10/01/2022   UTI (urinary tract infection) 09/24/2022   Hypomagnesemia 09/24/2022   Debility 09/23/2022   Hypokalemia 09/23/2022   B12 deficiency 09/05/2022   Folate deficiency 09/05/2022   Carpal tunnel syndrome, left upper limb 05/11/2019    Class: Chronic   Spinal stenosis of lumbar region with neurogenic claudication 08/20/2018   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   Primary osteoarthritis of right knee 02/27/2017   Right tennis elbow 11/11/2016   Muscle cramps 01/15/2016   Bilateral leg edema 01/08/2016   Radiculopathy 12/20/2015   Hyperglycemia 12/21/2014   Mastodynia, female 11/30/2014   S/P excision of fibroadenoma of breast 11/30/2014   Angioedema of  lips 04/07/2014   Fibroadenoma of right breast 03/07/2014   Pelvic pain in female 06/19/2012   Menorrhagia 06/11/2012   SUI (stress urinary incontinence, female) 06/11/2012   HTN (hypertension) 06/11/2012   IBS (irritable bowel syndrome) - diarrhea predominant 03/17/2012   MICROSCOPIC HEMATURIA 11/30/2010   BREAST PAIN, RIGHT 11/30/2010   BREAST MASS, RIGHT 01/12/2010   HYPERCHOLESTEROLEMIA 12/07/2007   CIGARETTE SMOKER 12/07/2007   DEGENERATIVE JOINT DISEASE 12/07/2007   Low back pain with sciatica 12/07/2007   HEADACHE 12/07/2007   Obesity 08/03/2007   Anxiety state 08/03/2007    ONSET DATE: 12/05/22- referral date  REFERRING DIAG: G61.0 (ICD-10-CM) - Guillain Barr syndrome (HCC)  THERAPY DIAG:    Rationale for Evaluation and Treatment: Rehabilitation  SUBJECTIVE:   SUBJECTIVE STATEMENT: Pt reports she is trying to get better as fast as she can PERTINENT HISTORY: Pt is a 57 y/o female hospitalized 09/23/22  with progressive weakness and sensation changes, consistent with Guillain-Barr syndrome. MRI negative. LP results and assessment most consistent with Guillain-Barr syndrome.  Pt was d/c home from CIR 11/06/22 PMH includes: glaucoma anxiety, DJD, fibromyalgia, HTN, tobacco use, B-12 deficiency, OA, ACDF 2017. Home health OT/PT wrapped up last week.  PRECAUTIONS: Fall  WEIGHT BEARING RESTRICTIONS: no  PAIN:  Are you having pain? Yes:  NPRS scale: 5/10 Pain location: bilateral knees Pain description: aching Aggravating factors: standing Relieving factors: rest    FALLS: Has patient fallen in last 6 months? No  LIVING ENVIRONMENT: Lives with: lives with their spouse Lives in: House/apartment Stairs: has ramp built Has following equipment at home:   BSC, steadi,  tub bench but can't access bathroom   PLOF: Independent  PATIENT GOALS: increase independence   OBJECTIVE:   HAND DOMINANCE: Right  ADLs: Overall ADLs: assistance required, unable to access  BR Transfers/ambulation related to ADLs: Eating: unable to cut food Grooming: set up UB Dressing: set up for bra and shirt LB Dressing: max A Toileting: uses BSC uses steadi Bathing: min-mod A in hospital  bed  Tub Shower transfers: n/a Equipment: Emergency planning/management officer  IADLs:dependent for IADLS   MOBILITY STATUS:  uses w/c  , per PT report minguard for transfer from w/c to mat    ACTIVITY TOLERANCE: Activity tolerance: Pt fatigues quickly    UPPER EXTREMITY ROM:    Active ROM Right eval Left eval  Shoulder flexion 120 100  Shoulder abduction 90 90  Shoulder adduction    Shoulder extension    Shoulder internal rotation    Shoulder external rotation    Elbow flexion    Elbow extension  -25  Wrist flexion    Wrist extension    Wrist ulnar deviation    Wrist radial deviation    Wrist pronation    Wrist supination    (  Blank rows = not tested)  UPPER EXTREMITY MMT:     MMT Right eval Left eval  Shoulder flexion 3+/5 3-/5  Shoulder abduction    Shoulder adduction    Shoulder extension    Shoulder internal rotation    Shoulder external rotation    Middle trapezius    Lower trapezius    Elbow flexion 3+/5 3/5  Elbow extension 3/5 3/5  Wrist flexion    Wrist extension    Wrist ulnar deviation    Wrist radial deviation    Wrist pronation    Wrist supination    (Blank rows = not tested)  HAND FUNCTION: Grip strength: Right: 32 lbs; Left: 18 lbs  COORDINATION: 9 Hole Peg test: Right: 1 min 48 sec; Left: 4 pegs in 2 mins  Box and Blocks:  Right 35 blocks, Left  26 blocks  SENSATION: Light touch: WFL Hot/Cold: Not tested    COGNITION: Overall cognitive status:NT   VISION: Subjective report: reports hx of glaucoma VISION ASSESSMENT: Not tested  OBSERVATIONS: Pt is eager to improve, pt's husband is very supportive   TODAY'S TREATMENT:                                                                                                                               DATE: 01/21/23 Red theraband exercises for the following: shoulder flexion, shoulder extension, shoulder abduction, rowing, biceps curls, triceps extension, min v.c initally, 10-15 reps each Arm bike x level 3 for conditioning Pt stood at sink x 2x with min A to perfrom functional overhead reaching with left and right UE's for a duration of approx 30 secs to 1 min at a time before rest break required.   01/16/23- Pt arrived late due to transportation. She reports attempting to back down ramp and bumped her w/c. They are coming to fix w/c.  Arm bike x 8 mins level 3 for conditioning Pt stood at sink x 4x with min A to perfrom functional overhead reaching with left and right UE's for a duration of approx 1-2 mins at a time before rest break required.  Pt reports she is getting a trailer on car for w/c. She was encouraged to have her husband come in for car transfers.  4/29/24Arm bike level 3 for conditioning x 8 mins  Pt reports she is anxious to get into her bathroom at home and she wants to try.  Therapist encouraged pt to have a family member come in for training prior to attempting and therapsits recommends that pt builds her endurance a bit more prior to attempting. Standing at countertop to remove cones form overhead shelf and replace. Initally with RUE then pt progressed to LUE. Pt required rest breaks after each step of task. Pt stood for several mins at a time then required rest break, minguad assist provided for safety. Therapist reviewed red theraband HEP with patient, 15 reps each, min v.c    PATIENT EDUCATION:  Education details: see above Person educated: Patient Education method: explanation, demonstration Education comprehension: verbalized understanding, returned demonstration, and verbal cues required   HOME EXERCISE PROGRAM:  12/17/22- coordination HEP, cane exercises seated   GOALS: Goals reviewed with patient? Yes  SHORT TERM GOALS: Target date:  01/11/23  I with initial HEP Baseline: Goal status: met 01/21/23  2.  Pt will demonstrate improved UE functional use as evidenced increasing bilateral box/ blocks score by 4 blocks.  Baseline: R 35, L 26 Goal status  met 12/31/22 RUE :43:  LUE 35    3.  Pt will donn pants with mod A. Baseline: max A Goal status: ongoing, max A 01/21/23  4. Pt will verbalize understanding of adapted strategies to maximize safety and I with ADLs/ IADLs .   Goal status:  ongoing, 01/21/23  5.  Pt will demonstrate ability to stand for functional activity with min A x 10 mins in prep for ADLs.  Goal status: ongoing, stands for   6.  Pt will perform w/c to [facility] transfers mod I  Goal status:  ongoing minA 01/21/23  LONG TERM GOALS: Target date: 03/06/23  I with updated HEP  Goal status: INITIAL  2.  Pt will perform LB dressing mod I. Baseline:max A Goal status: INITIAL  3.  Pt will bathe with supervision/ set up. Baseline: min-mod A for sponge bath Goal status: INITIAL  4.  Pt will perform basic cooking at a w/c level mod I  Goal status: ongoing pt reports cooking french fries mod I. Pt was cautioned at this time due to   5.  Pt will demonstrate improved fine motor coordination as evidenced by performing LUE 9 hole peg test in 2 mins or less.   Goal status: INITIAL  6.  Pt will demonstrate improved fine motor coordination as evidenced by performing RUE 9 hole peg test in 1 mins  30 secs or less.  Baseline: RUE  1 min 48 secs, LUE 4 pegs placed in 2 mins  Goal status: INITIAL  ASSESSMENT:  CLINICAL IMPRESSION: Pt is progressing towards goals. She continues to improve her standing balance but is limited by knee pain in both knees. She is highly motivated and demonstrates improving strength and endurance. PERFORMANCE DEFICITS: in functional skills including ADLs, IADLs, coordination, dexterity, sensation, ROM, strength, flexibility, Fine motor control, Gross motor control, mobility, balance,  endurance, decreased knowledge of precautions, decreased knowledge of use of DME, and UE functional use, cognitive skills including  and psychosocial skills including coping strategies, environmental adaptation, habits, interpersonal interactions, and routines and behaviors.   IMPAIRMENTS: are limiting patient from ADLs, IADLs, rest and sleep, play, leisure, and social participation.   CO-MORBIDITIES: may have co-morbidities  that affects occupational performance. Patient will benefit from skilled OT to address above impairments and improve overall function.  MODIFICATION OR ASSISTANCE TO COMPLETE EVALUATION: No modification of tasks or assist necessary to complete an evaluation.  OT OCCUPATIONAL PROFILE AND HISTORY: Detailed assessment: Review of records and additional review of physical, cognitive, psychosocial history related to current functional performance.  CLINICAL DECISION MAKING: LOW - limited treatment options, no task modification necessary  REHAB POTENTIAL: Good  EVALUATION COMPLEXITY: Low    PLAN:  OT FREQUENCY: 2x/week  OT DURATION: 12 weeks  PLANNED INTERVENTIONS: self care/ADL training, therapeutic exercise, therapeutic activity, neuromuscular re-education, manual therapy, passive range of motion, gait training, balance training, stair training, functional mobility training, aquatic therapy, ultrasound, paraffin, moist heat, cryotherapy, patient/family education, energy conservation, coping strategies training,  DME and/or AE instructions, and Re-evaluation  RECOMMENDED OTHER SERVICES: PT  CONSULTED AND AGREED WITH PLAN OF CARE: Patient and family member/caregiver  PLAN FOR NEXT SESSION:  practice LB dressing, continue ADL strategies, fine motor coordination, UE strength and endurance  Arayna Illescas, OT 01/21/2023, 9:11 AM

## 2023-01-23 ENCOUNTER — Ambulatory Visit: Payer: 59

## 2023-01-23 DIAGNOSIS — R278 Other lack of coordination: Secondary | ICD-10-CM

## 2023-01-23 DIAGNOSIS — R262 Difficulty in walking, not elsewhere classified: Secondary | ICD-10-CM

## 2023-01-23 DIAGNOSIS — R2681 Unsteadiness on feet: Secondary | ICD-10-CM

## 2023-01-23 DIAGNOSIS — G610 Guillain-Barre syndrome: Secondary | ICD-10-CM

## 2023-01-23 DIAGNOSIS — R2689 Other abnormalities of gait and mobility: Secondary | ICD-10-CM

## 2023-01-23 DIAGNOSIS — M6281 Muscle weakness (generalized): Secondary | ICD-10-CM

## 2023-01-23 NOTE — Therapy (Signed)
OUTPATIENT PHYSICAL THERAPY LOWER EXTREMITY TREATMENT   Patient Name: Dana [name] MRN: [id number] DOB:July 02, 1966, 57 y.o., female Today's Date: 01/21/2023  END OF SESSION:  PT End of Session - 01/21/23 1012     Visit Number 7    Date for PT Re-Evaluation 03/06/23    PT Start Time 0930    PT Stop Time 1010    PT Time Calculation (min) 40 min    Equipment Utilized During Treatment Gait belt    Activity Tolerance Patient tolerated treatment well    Behavior During Therapy WFL for tasks assessed/performed              Past Medical History:  Diagnosis Date   Anxiety    Back pain with radiation    Breast mass, right    Chronic pain    Chronic, continuous use of opioids    Complication of anesthesia 04/07/14   Allergic reaction to Lisinopril immediately following surgery   DJD (degenerative joint disease)    Fibromyalgia    Groin abscess    Headache(784.0)    History of IBS    Hypercholesterolemia    IBS (irritable bowel syndrome)    Lactose intolerance    Mild hypertension    Obesity    Tobacco use disorder    Umbilical hernia    Symptomatic   Past Surgical History:  Procedure Laterality Date   ABDOMINAL HYSTERECTOMY     ANTERIOR CERVICAL DECOMP/DISCECTOMY FUSION N/A 12/20/2015   Procedure: ANTERIOR CERVICAL DECOMPRESSION FUSION CERVICAL 4-5, CERVICAL 5-6, CERVICAL 6-7 WITH INSTRUMENTATION AND ALLOGRAFT;  Surgeon: Estill Bamberg, MD;  Location: [facility] OR;  Service: Orthopedics;  Laterality: N/A;  Anterior cervical decompression fusion, cervical 4-5, cervical 5-6, cervical 6-7 with instrumentation and allograft   BACK SURGERY     BILATERAL SALPINGECTOMY  09/03/2012   Procedure: BILATERAL SALPINGECTOMY;  Surgeon: Ok Edwards, MD;  Location: [facility]S;  Service: Gynecology;  Laterality: Bilateral;   BREAST BIOPSY Right 04/07/2014   Procedure: REMOVAL RIGHT BREAST MASS WITH WIRE LOCALIZATION;  Surgeon: Adolph Pollack, MD;  Location: [facility] OR;  Service: General;   Laterality: Right;   BREAST EXCISIONAL BIOPSY Left    x2   BREAST LUMPECTOMY     x2   CARPAL TUNNEL RELEASE Left 05/11/2019   Procedure: LEFT CARPAL TUNNEL RELEASE, RIGHT TENNIS ELBOW MARCAINE/DEPO MEDROL INJECTION UNDER ANESTHESIA;  Surgeon: Kerrin Champagne, MD;  Location: [facility] OR;  Service: Orthopedics;  Laterality: Left;   COLONOSCOPY W/ BIOPSIES  04/24/2012   per Dr. Leone Payor, clear, repeat in 10 yrs    disectomy     ESOPHAGOGASTRODUODENOSCOPY     FINGER SURGERY     Right index-excision of mass    FOOT SURGERY Right    Bone Spurs   LAPAROSCOPIC HYSTERECTOMY  09/03/2012   Procedure: HYSTERECTOMY TOTAL LAPAROSCOPIC;  Surgeon: Ok Edwards, MD;  Location: [facility];  Service: Gynecology;  Laterality: N/A;   LUMBAR DISC SURGERY     TUBAL LIGATION     UMBILICAL HERNIA REPAIR N/A 05/01/2018   Procedure: UMBILICAL HERNIA REPAIR;  Surgeon: Jimmye Norman, MD;  Location: [facility] OR;  Service: General;  Laterality: N/A;   Patient Active Problem List   Diagnosis Date Noted   Wheelchair dependence 12/30/2022   Nerve pain 12/30/2022   Insomnia due to medical condition 12/30/2022   Guillain Barr syndrome (HCC) 10/01/2022   UTI (urinary tract infection) 09/24/2022   Hypomagnesemia 09/24/2022   Debility 09/23/2022   Hypokalemia 09/23/2022   B12  deficiency 09/05/2022   Folate deficiency 09/05/2022   Carpal tunnel syndrome, left upper limb 05/11/2019    Class: Chronic   Spinal stenosis of lumbar region with neurogenic claudication 08/20/2018   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   Primary osteoarthritis of right knee 02/27/2017   Right tennis elbow 11/11/2016   Muscle cramps 01/15/2016   Bilateral leg edema 01/08/2016   Radiculopathy 12/20/2015   Hyperglycemia 12/21/2014   Mastodynia, female 11/30/2014   S/P excision of fibroadenoma of breast 11/30/2014   Angioedema of lips 04/07/2014   Fibroadenoma of right breast 03/07/2014   Pelvic pain in female 06/19/2012   Menorrhagia 06/11/2012    SUI (stress urinary incontinence, female) 06/11/2012   HTN (hypertension) 06/11/2012   IBS (irritable bowel syndrome) - diarrhea predominant 03/17/2012   MICROSCOPIC HEMATURIA 11/30/2010   BREAST PAIN, RIGHT 11/30/2010   BREAST MASS, RIGHT 01/12/2010   HYPERCHOLESTEROLEMIA 12/07/2007   CIGARETTE SMOKER 12/07/2007   DEGENERATIVE JOINT DISEASE 12/07/2007   Low back pain with sciatica 12/07/2007   HEADACHE 12/07/2007   Obesity 08/03/2007   Anxiety state 08/03/2007    PCP: Nelwyn Salisbury, MD  REFERRING PROVIDER: Nelwyn Salisbury, MD  REFERRING DIAG: G61.0 (ICD-10-CM) - Guillain Barr syndrome [facility])   THERAPY DIAG:  Other lack of coordination  Muscle weakness (generalized)  Unsteadiness on feet  Other abnormalities of gait and mobility  Difficulty in walking, not elsewhere classified  Guillain Barr syndrome [facility])  Rationale for Evaluation and Treatment: Rehabilitation  ONSET DATE: 12/05/22  SUBJECTIVE:   SUBJECTIVE STATEMENT: Patient reports that she feels the same sensations she did when first going to the hospital with GB, a little nervous.   OBJECTIVE STATEMENT: Dana [name] is a 57 y.o. female who presented to the [facility] ED on 09/23/2022 with bilateral lower extremity weakness with decreased mobility that has progressed to both arms. She also reported numbness and tingling of the extremities. She was transferred to [facility] for MRI. 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  Inpatient Rehab F/B HHPT.  R knee injection 10/08/22 trigger point injections for worsening back pain 2/15  RA  PAIN:  Are you having pain? Yes: NPRS scale: up to 10/10 Pain location: all over, but her R knee pain is worst,  Pain description: Sharp Aggravating factors: Has difficulty standing up Relieving factors: Medicine helps somewhat  PRECAUTIONS: Fall  WEIGHT BEARING RESTRICTIONS: No  FALLS:  Has patient fallen in last 6 months? No  LIVING  ENVIRONMENT: Lives with: lives with their family Lives in: House/apartment Stairs: Nohas a ramp Has following equipment at home: Environmental consultant - 2 wheeled, Wheelchair (power), Tour manager, bed side commode, Ramped entry, and [employer] lift  OCCUPATION: on disability due to RA and a fall several years ago  PLOF: Independent  PATIENT GOALS: walk  NEXT MD VISIT: She is scheduled to follow up with Dr. Serita Sheller with Physical Med/Rehab on 12-30-22. She is scheduled to follow up with Dr. Levert Feinstein at Neurology on 01-28-23.   OBJECTIVE:   DIAGNOSTIC FINDINGS:  Right knee radiographs 11/09/2020   FINDINGS: Severe patellofemoral joint space narrowing. Severe superior and mild inferior patellar degenerative osteophytosis. Moderate superior trochlear degenerative osteophytosis. No joint effusion. Severe medial compartment joint space narrowing with moderate peripheral medial and lateral compartment degenerative osteophytes.  COGNITION: Overall cognitive status: Within functional limits for tasks assessed     SENSATION: Light touch: Impaired  and B hands and feet and lower legs  EDEMA:  BLE edema noted.  MUSCLE LENGTH: Hamstrings: WFL Thomas test: B tightness, to neutral  POSTURE: rounded shoulders and flexed trunk   PALPATION: No TTP  LOWER EXTREMITY ROM: BLE ROM limited in all planes an all joints due to body habitus   LOWER EXTREMITY MMT:  MMT Right eval Left eval  Hip flexion 3 3-  Hip extension 3- 3-  Hip abduction 3 3-  Hip adduction    Hip internal rotation    Hip external rotation    Knee flexion 4 4-  Knee extension 4- 4-  Ankle dorsiflexion 4- 4-  Ankle plantarflexion    Ankle inversion 4- 4-  Ankle eversion 4- 4-   (Blank rows = not tested)  FUNCTIONAL TESTS:  5 times sit to stand: unable to complete Timed up and go (TUG): unable to complete  Bed Mobility: Occasional min A for sup <> sit, rolled B on mat with great effort, light min A  TRANSFERS: Scooting  transfer with CGA, stand pivot with CGA, she reports that she sometimes has difficulty rising due to her knee pain  GAIT: Distance walked: 0' Assistive device utilized:  shopping cart-will bring her RW from home. Level of assistance: Min A Comments: Stood and weight shifted, then stood again and took steps to turn to W/C.  TODAY'S TREATMENT:                                                                                                                              DATE:  01/23/23 NuStep L5 x 6 minutes Transfers <> mat and NuStep with CGA for safety, no physical assistance required Supine with physioball- Rolling DKTC with gentle push for stretch and stability, Bridge, Bridge with B hip IR, quick LTR to activate abdominals. 10 reps each Heel slides x5 LLE Bridges x10  Standing with RW - 10s stands without any support x2  Small marches with RW 2x5  Transfer mat <>WC stand pivot with minA 2 people help Seated cable rows and ext 10# 2x10    01/21/23 B (15) and then single limb (2 x 10) pushes on Fitter Seated shoulder ext, rows, trunk rotation, all with G tband resistance, 10 reps each. Parallel bars, standing weight shifts, Heel raises x 10, sliding B hands on rail Single steps forward and back with alternating legs, RLE had partial buckle, required min A for stability, with a lot of pain for patient.  01/16/23 NuStep L5 x 6 minutes then L7 legs only x 2 minutes. Seated shoulder ext and rows with 10#, 2 x 10 reps each Seated chest press against 5# x 10 reps Scooting transfers to each side with S. Seated rotation to R, slide hands out, lift L leg onto the mat, then return. 3 x each direction. Ambulation x 8 steps with RW and min A. She had more difficulty stepping with LLE due to R knee pain today.  01/14/23 Mat work to activate trunk and LE-Rolling to L side lie and back  with R arm and leg lifted off mat. Then rolled to L side and performed R hip abd with forward and back slow  swing. Repeated to R, had increased difficulty lifting LLE off mat-performed lower body ant/post shifts, then repeated with improved control noted. NuStep L3 x 6 min, then L8 x 2 minutes, legs only. Performed stand pivot transfers x 2 using RW Ambulated x 8 steps with RW and CGA, following with W/C, fearful, but stable.  01/09/23 Nustep level 4 x 6 minutes 5# rows on machine pulleys 2x10 5# straight arm pulls 2x10 5# chest press pulleys 2x10 Ball isometric abs 2x10 Standing in walker x 45 seconds cues for posture and knees then again for one minute with weight shifts and small marches HS stretches in sitting   LAQ 1# right, 2.5# left 3x10 Seated hip abduction Marching 1# right, no weight left 3x10 Ball b/n knees squeeze 3x10 Red tband HS curls Standing with walker march weight shift and standing x 1 minute  01/02/23 Seated long kicks and hip flexion 2 x 10 reps each. Seated heel raise/toe raise 2 x 10 reps Facing mat with seat of W/C elevated, sit to stand, CGA, x 3 Scoot transfer to NuStep, S NuStep L3 x 4 minutes, then 20 sec at L5 with legs only Stand pivot transfer to W/C. Min A. Initiatlly fearful and unable ot step with LLE. Improved on second attempt, VC for upright posture which helped. Seated rotation to L, slide hands out to the side and back, 2 x 5 in each direction Stand pivot to W/C with RW and min A.  12/31/22 LAQ 2x10 Seated marching 20 reps  Seated heel to toe raises 2x10 HS curls 2x10 red  Ball squeezes 2x10  Hip abd with band green 2x10  Mini squats in // bars x5 1 step forward and back in // bars   12/12/22  Education  PATIENT EDUCATION:  Education details: POC Person educated: Patient Education method: Explanation Education comprehension: verbalized understanding  HOME EXERCISE PROGRAM: TBD  ASSESSMENT:  CLINICAL IMPRESSION: Patient reports increase pain in R knee today. She also feels that she is getting sick again like the first time. Reports  numbness in her legs especially her LLE. Did some activities supine on mat table to get her moving within her painful ranges. She performed standing with RW and attempted stepping, was able to take 2 steps before having pain and unable to continue. She actually does better with backwards steps.  Able to do a stand pivot with assistance to get back in chair.   OBJECTIVE IMPAIRMENTS: Abnormal gait, decreased activity tolerance, decreased balance, decreased coordination, decreased endurance, difficulty walking, decreased ROM, decreased strength, and postural dysfunction.   ACTIVITY LIMITATIONS: carrying, lifting, bending, standing, squatting, stairs, transfers, and locomotion level  PARTICIPATION LIMITATIONS: meal prep, cleaning, laundry, driving, and shopping  PERSONAL FACTORS: Fitness, Past/current experiences, and 1 comorbidity: Recently diagnosed with GBS  are also affecting patient's functional outcome.   REHAB POTENTIAL: Good  CLINICAL DECISION MAKING: Evolving/moderate complexity  EVALUATION COMPLEXITY: Moderate   GOALS: Goals reviewed with patient? Yes  SHORT TERM GOALS: Target date: 11/28/22 I with initial HEP Baseline: Goal status: met 01/09/23  LONG TERM GOALS: Target date: 03/06/23  I with final HEP Baseline:  Goal status: INITIAL  2.  Decrease 5x STS to < 14 sec Baseline: unable to complete Goal status: 01/02/23-Initiated strengthening  3.  Decrease TUG to < 20 sec Baseline: Unable to complete. Goal status: INITIAL  4.  Patient will ambulate at least 300' with LRAD, MI, on level and unlevel surfaces. Baseline: Stand pivot transfer with CGA Goal status: INITIAL  5.  Perform all bed mobility with I Baseline: Occ min A on mat Goal status: INITIAL  6.  Increase BLE strength to at least 4/5 throughout. Baseline: (3-)-3/5 Goal status: INITIAL  PLAN:  PT FREQUENCY: 1-2x/week  PT DURATION: 12 weeks  PLANNED INTERVENTIONS: Therapeutic exercises, Therapeutic  activity, Neuromuscular re-education, Balance training, Gait training, Patient/Family education, Self Care, Joint mobilization, Stair training, Dry Needling, Electrical stimulation, Cryotherapy, Moist heat, Ultrasound, Ionotophoresis 4mg /ml Dexamethasone, and Manual therapy  PLAN FOR NEXT SESSION: continue to progress as tolerated. Try quadruped, maybe the leg press   Oley Balm DPT 01/21/23 10:16 AM

## 2023-01-27 ENCOUNTER — Telehealth: Payer: Self-pay | Admitting: Family Medicine

## 2023-01-27 NOTE — Telephone Encounter (Signed)
Pt Last PMV was on 11/27/2022 Last refill was done on 11/27/22 Please advise

## 2023-01-27 NOTE — Telephone Encounter (Signed)
Pt is calling and would like to have hydromorphone 4 mg transfer to  [id number] - [place], [place] - [address] Phone: [phone]  Fax: [phone]

## 2023-01-28 ENCOUNTER — Other Ambulatory Visit: Payer: Self-pay

## 2023-01-28 ENCOUNTER — Inpatient Hospital Stay (HOSPITAL_COMMUNITY)
Admission: EM | Admit: 2023-01-28 | Discharge: 2023-02-08 | DRG: 074 | Disposition: A | Discharge status: Home / Self Care | Payer: 59 | Source: Ambulatory Visit | Attending: Internal Medicine | Admitting: Internal Medicine

## 2023-01-28 ENCOUNTER — Encounter (HOSPITAL_COMMUNITY): Payer: Self-pay

## 2023-01-28 ENCOUNTER — Ambulatory Visit (INDEPENDENT_AMBULATORY_CARE_PROVIDER_SITE_OTHER): Payer: 59 | Admitting: Neurology

## 2023-01-28 ENCOUNTER — Encounter: Payer: Self-pay | Admitting: Neurology

## 2023-01-28 VITALS — BP 136/84 | Ht 64.0 in | Wt 291.0 lb

## 2023-01-28 DIAGNOSIS — E669 Obesity, unspecified: Secondary | ICD-10-CM | POA: Diagnosis present

## 2023-01-28 DIAGNOSIS — Z79899 Other long term (current) drug therapy: Secondary | ICD-10-CM | POA: Diagnosis not present

## 2023-01-28 DIAGNOSIS — M5136 Other intervertebral disc degeneration, lumbar region: Secondary | ICD-10-CM | POA: Diagnosis present

## 2023-01-28 DIAGNOSIS — G610 Guillain-Barre syndrome: Secondary | ICD-10-CM | POA: Diagnosis present

## 2023-01-28 DIAGNOSIS — G894 Chronic pain syndrome: Secondary | ICD-10-CM | POA: Diagnosis present

## 2023-01-28 DIAGNOSIS — E66813 Obesity, class 3: Secondary | ICD-10-CM | POA: Diagnosis present

## 2023-01-28 DIAGNOSIS — K589 Irritable bowel syndrome without diarrhea: Secondary | ICD-10-CM | POA: Diagnosis present

## 2023-01-28 DIAGNOSIS — Z7901 Long term (current) use of anticoagulants: Secondary | ICD-10-CM

## 2023-01-28 DIAGNOSIS — Z885 Allergy status to narcotic agent status: Secondary | ICD-10-CM | POA: Diagnosis not present

## 2023-01-28 DIAGNOSIS — G6181 Chronic inflammatory demyelinating polyneuritis: Principal | ICD-10-CM | POA: Diagnosis present

## 2023-01-28 DIAGNOSIS — R269 Unspecified abnormalities of gait and mobility: Secondary | ICD-10-CM

## 2023-01-28 DIAGNOSIS — M170 Bilateral primary osteoarthritis of knee: Secondary | ICD-10-CM | POA: Diagnosis present

## 2023-01-28 DIAGNOSIS — Z993 Dependence on wheelchair: Secondary | ICD-10-CM

## 2023-01-28 DIAGNOSIS — Z833 Family history of diabetes mellitus: Secondary | ICD-10-CM

## 2023-01-28 DIAGNOSIS — K219 Gastro-esophageal reflux disease without esophagitis: Secondary | ICD-10-CM | POA: Diagnosis present

## 2023-01-28 DIAGNOSIS — M797 Fibromyalgia: Secondary | ICD-10-CM | POA: Diagnosis present

## 2023-01-28 DIAGNOSIS — G8929 Other chronic pain: Secondary | ICD-10-CM | POA: Diagnosis not present

## 2023-01-28 DIAGNOSIS — I10 Essential (primary) hypertension: Secondary | ICD-10-CM | POA: Diagnosis present

## 2023-01-28 DIAGNOSIS — Z981 Arthrodesis status: Secondary | ICD-10-CM

## 2023-01-28 DIAGNOSIS — R531 Weakness: Secondary | ICD-10-CM | POA: Diagnosis not present

## 2023-01-28 DIAGNOSIS — Z87891 Personal history of nicotine dependence: Secondary | ICD-10-CM | POA: Diagnosis not present

## 2023-01-28 DIAGNOSIS — E7800 Pure hypercholesterolemia, unspecified: Secondary | ICD-10-CM | POA: Diagnosis present

## 2023-01-28 DIAGNOSIS — Z6841 Body Mass Index (BMI) 40.0 and over, adult: Secondary | ICD-10-CM

## 2023-01-28 DIAGNOSIS — Z880 Allergy status to penicillin: Secondary | ICD-10-CM

## 2023-01-28 DIAGNOSIS — H409 Unspecified glaucoma: Secondary | ICD-10-CM | POA: Insufficient documentation

## 2023-01-28 DIAGNOSIS — M48062 Spinal stenosis, lumbar region with neurogenic claudication: Secondary | ICD-10-CM | POA: Diagnosis present

## 2023-01-28 DIAGNOSIS — Z888 Allergy status to other drugs, medicaments and biological substances status: Secondary | ICD-10-CM | POA: Diagnosis not present

## 2023-01-28 DIAGNOSIS — Z90710 Acquired absence of both cervix and uterus: Secondary | ICD-10-CM

## 2023-01-28 DIAGNOSIS — Z87892 Personal history of anaphylaxis: Secondary | ICD-10-CM | POA: Diagnosis not present

## 2023-01-28 DIAGNOSIS — F411 Generalized anxiety disorder: Secondary | ICD-10-CM | POA: Diagnosis present

## 2023-01-28 DIAGNOSIS — Z886 Allergy status to analgesic agent status: Secondary | ICD-10-CM | POA: Diagnosis not present

## 2023-01-28 DIAGNOSIS — Z79891 Long term (current) use of opiate analgesic: Secondary | ICD-10-CM

## 2023-01-28 DIAGNOSIS — Z803 Family history of malignant neoplasm of breast: Secondary | ICD-10-CM

## 2023-01-28 DIAGNOSIS — N390 Urinary tract infection, site not specified: Secondary | ICD-10-CM | POA: Diagnosis present

## 2023-01-28 LAB — CBC WITH DIFFERENTIAL/PLATELET
Abs Immature Granulocytes: 0.03 10*3/uL (ref 0.00–0.07)
Basophils Absolute: 0 10*3/uL (ref 0.0–0.1)
Basophils Relative: 0 %
Eosinophils Absolute: 0.1 10*3/uL (ref 0.0–0.5)
Eosinophils Relative: 1 %
HCT: 43.8 % (ref 36.0–46.0)
Hemoglobin: 13.9 g/dL (ref 12.0–15.0)
Immature Granulocytes: 0 %
Lymphocytes Relative: 21 %
Lymphs Abs: 1.9 10*3/uL (ref 0.7–4.0)
MCH: 28.4 pg (ref 26.0–34.0)
MCHC: 31.7 g/dL (ref 30.0–36.0)
MCV: 89.4 fL (ref 80.0–100.0)
Monocytes Absolute: 0.7 10*3/uL (ref 0.1–1.0)
Monocytes Relative: 8 %
Neutro Abs: 6.3 10*3/uL (ref 1.7–7.7)
Neutrophils Relative %: 70 %
Platelets: 289 10*3/uL (ref 150–400)
RBC: 4.9 MIL/uL (ref 3.87–5.11)
RDW: 14.4 % (ref 11.5–15.5)
WBC: 9.1 10*3/uL (ref 4.0–10.5)
nRBC: 0 % (ref 0.0–0.2)

## 2023-01-28 LAB — BASIC METABOLIC PANEL
Anion gap: 14 (ref 5–15)
BUN: 11 mg/dL (ref 6–20)
CO2: 24 mmol/L (ref 22–32)
Calcium: 9.7 mg/dL (ref 8.9–10.3)
Chloride: 102 mmol/L (ref 98–111)
Creatinine, Ser: 1.06 mg/dL — ABNORMAL HIGH (ref 0.44–1.00)
GFR, Estimated: >60 mL/min (ref >60)
Glucose, Bld: 106 mg/dL — ABNORMAL HIGH (ref 70–99)
Potassium: 4.2 mmol/L (ref 3.5–5.1)
Sodium: 140 mmol/L (ref 135–145)

## 2023-01-28 MED ORDER — CYCLOBENZAPRINE HCL 10 MG PO TABS
10.0000 mg | ORAL_TABLET | Freq: Three times a day (TID) | ORAL | Status: DC | PRN
  Administered 2023-01-29 (×2): 10 mg via ORAL
  Filled 2023-01-28 (×2): qty 1

## 2023-01-28 MED ORDER — HYDROMORPHONE HCL 4 MG PO TABS: 4.0000 mg | ORAL_TABLET | Freq: Four times a day (QID) | ORAL | 0 refills | Status: DC | PRN

## 2023-01-28 MED ORDER — ALPRAZOLAM 0.5 MG PO TABS
0.5000 mg | ORAL_TABLET | Freq: Two times a day (BID) | ORAL | Status: DC | PRN
  Administered 2023-02-03: 0.5 mg via ORAL
  Filled 2023-01-28: qty 1

## 2023-01-28 MED ORDER — HYDROMORPHONE HCL 2 MG PO TABS
2.0000 mg | ORAL_TABLET | Freq: Four times a day (QID) | ORAL | Status: DC | PRN
  Administered 2023-01-28 – 2023-01-30 (×5): 2 mg via ORAL
  Filled 2023-01-28 (×5): qty 1

## 2023-01-28 MED ORDER — FUROSEMIDE 20 MG PO TABS
20.0000 mg | ORAL_TABLET | Freq: Two times a day (BID) | ORAL | Status: DC
  Administered 2023-01-28 – 2023-02-08 (×21): 20 mg via ORAL
  Filled 2023-01-28 (×21): qty 1

## 2023-01-28 MED ORDER — POLYETHYLENE GLYCOL 3350 17 G PO PACK
17.0000 g | PACK | Freq: Every day | ORAL | Status: DC | PRN
  Filled 2023-01-28: qty 1

## 2023-01-28 MED ORDER — LATANOPROST 0.005 % OP SOLN
1.0000 [drp] | Freq: Every day | OPHTHALMIC | Status: DC
  Administered 2023-01-28 – 2023-02-07 (×10): 1 [drp] via OPHTHALMIC
  Filled 2023-01-28: qty 2.5

## 2023-01-28 MED ORDER — TRAZODONE HCL 50 MG PO TABS
50.0000 mg | ORAL_TABLET | Freq: Every evening | ORAL | Status: DC | PRN
  Administered 2023-01-28 – 2023-01-29 (×2): 50 mg via ORAL
  Administered 2023-01-31 – 2023-02-03 (×4): 100 mg via ORAL
  Administered 2023-02-05 – 2023-02-07 (×2): 50 mg via ORAL
  Filled 2023-01-28: qty 1
  Filled 2023-01-28: qty 2
  Filled 2023-01-28 (×2): qty 1
  Filled 2023-01-28: qty 2
  Filled 2023-01-28: qty 1
  Filled 2023-01-28 (×3): qty 2

## 2023-01-28 MED ORDER — FUROSEMIDE 20 MG PO TABS
40.0000 mg | ORAL_TABLET | Freq: Two times a day (BID) | ORAL | Status: DC
  Filled 2023-01-28: qty 2

## 2023-01-28 MED ORDER — METOPROLOL TARTRATE 50 MG PO TABS
50.0000 mg | ORAL_TABLET | Freq: Two times a day (BID) | ORAL | Status: DC
  Administered 2023-01-28 – 2023-02-08 (×22): 50 mg via ORAL
  Filled 2023-01-28 (×23): qty 1

## 2023-01-28 MED ORDER — DORZOLAMIDE HCL-TIMOLOL MAL 2-0.5 % OP SOLN
1.0000 [drp] | Freq: Two times a day (BID) | OPHTHALMIC | Status: DC
  Administered 2023-01-28 – 2023-02-08 (×21): 1 [drp] via OPHTHALMIC
  Filled 2023-01-28: qty 10

## 2023-01-28 MED ORDER — PANTOPRAZOLE SODIUM 40 MG PO TBEC
40.0000 mg | DELAYED_RELEASE_TABLET | Freq: Every day | ORAL | Status: DC
  Administered 2023-01-29 – 2023-02-08 (×11): 40 mg via ORAL
  Filled 2023-01-28 (×11): qty 1

## 2023-01-28 MED ORDER — ENOXAPARIN SODIUM 60 MG/0.6ML IJ SOSY
60.0000 mg | PREFILLED_SYRINGE | INTRAMUSCULAR | Status: DC
  Administered 2023-01-28: 60 mg via SUBCUTANEOUS
  Filled 2023-01-28: qty 0.6

## 2023-01-28 MED ORDER — SODIUM CHLORIDE 0.9% FLUSH
3.0000 mL | Freq: Two times a day (BID) | INTRAVENOUS | Status: DC
  Administered 2023-01-28 – 2023-02-08 (×21): 3 mL via INTRAVENOUS

## 2023-01-28 MED ORDER — ENOXAPARIN SODIUM 40 MG/0.4ML IJ SOSY: 40.0000 mg | PREFILLED_SYRINGE | INTRAMUSCULAR | Status: DC

## 2023-01-28 MED ORDER — GABAPENTIN 300 MG PO CAPS
900.0000 mg | ORAL_CAPSULE | Freq: Three times a day (TID) | ORAL | Status: DC
  Administered 2023-01-28 – 2023-02-08 (×33): 900 mg via ORAL
  Filled 2023-01-28 (×34): qty 3

## 2023-01-28 NOTE — ED Notes (Signed)
ED TO INPATIENT HANDOFF REPORT  ED Nurse Name and Phone #: [phone]  S Name/Age/Gender Dana [name] 57 y.o. female Room/Bed: 007C/007C  Code Status   Code Status: Full Code  Home/SNF/Other Home Patient oriented to: self, place, time, and situation Is this baseline? Yes   Triage Complete: Triage complete  Chief Complaint Acute inflammatory demyelinating polyradiculoneuropathic form of Guillain-Barre syndrome (HCC) [G61.0]  Triage Note Pt BIB [facility] from [facility]. Pt was there for a follow up appt and they sent her here. Pt has Guillian-barre syndrome, was recently admitted to the hospital for the same in January.  BP 150/70 HR 70    Allergies Allergies  Allergen Reactions   Lisinopril Anaphylaxis    was hospitalized for 3 days   Codeine Hives   Cyanocobalamin [Vitamin B12] Itching    Patient able to take B12 injections with Benadryl.  Has no issues when taking B12 tablets.    Hydrocodone Hives   Penicillins Itching and Swelling    PATIENT HAS HAD A PCN REACTION WITH IMMEDIATE RASH, FACIAL/TONGUE/THROAT SWELLING, SOB, OR LIGHTHEADEDNESS WITH HYPOTENSION:  #  #  YES  #  #  Has patient had a PCN reaction causing severe rash involving mucus membranes or skin necrosis: No Has patient had a PCN reaction that required hospitalization No Has patient had a PCN reaction occurring within the last 10 years: No If all of the above answers are "NO", then may proceed with Cephalosporin use.    Amlodipine Swelling   Acetaminophen Rash    Level of Care/Admitting Diagnosis ED Disposition     ED Disposition  Admit   Condition  --   Comment  Hospital Area: [facility] [[id number]]  Level of Care: Telemetry Medical [104]  May admit patient to [facility] or [facility] if equivalent level of care is available:: No  Covid Evaluation: Asymptomatic - no recent exposure (last 10 days) testing not required  Diagnosis: Acute inflammatory demyelinating  polyradiculoneuropathic form of Guillain-Barre syndrome [facility]) [[id number]]  Admitting Physician: Synetta Fail [[id number]]  Attending Physician: Synetta Fail [phone]  Certification:: I certify this patient will need inpatient services for at least 2 midnights  Estimated Length of Stay: 2          B Medical/Surgery History Past Medical History:  Diagnosis Date   Anxiety    Back pain with radiation    Breast mass, right    Chronic pain    Chronic, continuous use of opioids    Complication of anesthesia 04/07/14   Allergic reaction to Lisinopril immediately following surgery   DJD (degenerative joint disease)    Fibromyalgia    Groin abscess    Headache(784.0)    History of IBS    Hypercholesterolemia    IBS (irritable bowel syndrome)    Lactose intolerance    Mild hypertension    Obesity    Tobacco use disorder    Umbilical hernia    Symptomatic   Past Surgical History:  Procedure Laterality Date   ABDOMINAL HYSTERECTOMY     ANTERIOR CERVICAL DECOMP/DISCECTOMY FUSION N/A 12/20/2015   Procedure: ANTERIOR CERVICAL DECOMPRESSION FUSION CERVICAL 4-5, CERVICAL 5-6, CERVICAL 6-7 WITH INSTRUMENTATION AND ALLOGRAFT;  Surgeon: Estill Bamberg, MD;  Location: [facility] OR;  Service: Orthopedics;  Laterality: N/A;  Anterior cervical decompression fusion, cervical 4-5, cervical 5-6, cervical 6-7 with instrumentation and allograft   BACK SURGERY     BILATERAL SALPINGECTOMY  09/03/2012   Procedure: BILATERAL SALPINGECTOMY;  Surgeon: Gaetano Hawthorne  Lily Peer, MD;  Location: [facility];  Service: Gynecology;  Laterality: Bilateral;   BREAST BIOPSY Right 04/07/2014   Procedure: REMOVAL RIGHT BREAST MASS WITH WIRE LOCALIZATION;  Surgeon: Adolph Pollack, MD;  Location: [facility] OR;  Service: General;  Laterality: Right;   BREAST EXCISIONAL BIOPSY Left    x2   BREAST LUMPECTOMY     x2   CARPAL TUNNEL RELEASE Left 05/11/2019   Procedure: LEFT CARPAL TUNNEL RELEASE, RIGHT TENNIS ELBOW MARCAINE/DEPO MEDROL  INJECTION UNDER ANESTHESIA;  Surgeon: Kerrin Champagne, MD;  Location: [facility] OR;  Service: Orthopedics;  Laterality: Left;   COLONOSCOPY W/ BIOPSIES  04/24/2012   per Dr. Leone Payor, clear, repeat in 10 yrs    disectomy     ESOPHAGOGASTRODUODENOSCOPY     FINGER SURGERY     Right index-excision of mass    FOOT SURGERY Right    Bone Spurs   LAPAROSCOPIC HYSTERECTOMY  09/03/2012   Procedure: HYSTERECTOMY TOTAL LAPAROSCOPIC;  Surgeon: Ok Edwards, MD;  Location: [facility];  Service: Gynecology;  Laterality: N/A;   LUMBAR DISC SURGERY     TUBAL LIGATION     UMBILICAL HERNIA REPAIR N/A 05/01/2018   Procedure: UMBILICAL HERNIA REPAIR;  Surgeon: Jimmye Norman, MD;  Location: [facility] OR;  Service: General;  Laterality: N/A;     A IV Location/Drains/Wounds Patient Lines/Drains/Airways Status     Active Line/Drains/Airways     None            Intake/Output Last 24 hours No intake or output data in the 24 hours ending 01/28/23 1459  Labs/Imaging Results for orders placed or performed during the hospital encounter of 01/28/23 (from the past 48 hour(s))  CBC with Differential     Status: None   Collection Time: 01/28/23  1:20 PM  Result Value Ref Range   WBC 9.1 4.0 - 10.5 K/uL   RBC 4.90 3.87 - 5.11 MIL/uL   Hemoglobin 13.9 12.0 - 15.0 g/dL   HCT 40.9 81.1 - 91.4 %   MCV 89.4 80.0 - 100.0 fL   MCH 28.4 26.0 - 34.0 pg   MCHC 31.7 30.0 - 36.0 g/dL   RDW 78.2 95.6 - 21.3 %   Platelets 289 150 - 400 K/uL   nRBC 0.0 0.0 - 0.2 %   Neutrophils Relative % 70 %   Neutro Abs 6.3 1.7 - 7.7 K/uL   Lymphocytes Relative 21 %   Lymphs Abs 1.9 0.7 - 4.0 K/uL   Monocytes Relative 8 %   Monocytes Absolute 0.7 0.1 - 1.0 K/uL   Eosinophils Relative 1 %   Eosinophils Absolute 0.1 0.0 - 0.5 K/uL   Basophils Relative 0 %   Basophils Absolute 0.0 0.0 - 0.1 K/uL   Immature Granulocytes 0 %   Abs Immature Granulocytes 0.03 0.00 - 0.07 K/uL    Comment: Performed at [facility], [address],  [place], [place] [address]  Basic metabolic panel     Status: Abnormal   Collection Time: 01/28/23  1:20 PM  Result Value Ref Range   Sodium 140 135 - 145 mmol/L   Potassium 4.2 3.5 - 5.1 mmol/L   Chloride 102 98 - 111 mmol/L   CO2 24 22 - 32 mmol/L   Glucose, Bld 106 (H) 70 - 99 mg/dL    Comment: Glucose reference range applies only to samples taken after fasting for at least 8 hours.   BUN 11 6 - 20 mg/dL   Creatinine, Ser 8.46 (H)  0.44 - 1.00 mg/dL   Calcium 9.7 8.9 - 40.9 mg/dL   GFR, Estimated >81 >19 mL/min    Comment: (NOTE) Calculated using the CKD-EPI Creatinine Equation (2021)    Anion gap 14 5 - 15    Comment: Performed at [facility], [address], [place], [place] [address]   NCV with EMG(electromyography)  Result Date: 01/28/2023 Levert Feinstein, MD     01/28/2023  1:03 PM     Full Name: Carnisha Raikes Gender: Female MRN #: [id number] Date of Birth: 05/11/1966   Visit Date: 01/28/2023 09:17 Age: 54 Years Examining Physician: Dr. Levert Feinstein Referring Physician: Dr. Levert Feinstein Height: 5 feet 4 inch History: 57 year old female, presenting with ascending paresthesia, weakness, gait abnormality Summary of the tests: Nerve conduction study: Right sural, superficial peroneal, bilateral median, ulnar and radial sensory responses were absent. Left tibial motor responses were absent.  Left peroneal to EDB motor response showed normal CMAP amplitude at distal stimulation site, but was not able to elicit any response at proximal stimulation site. Right tibial motor responses showed significantly decreased CMAP amplitude, was not able to elicit any response at proximal stimulation side. Bilateral ulnar, right median motor responses showed moderately prolonged distal latency, F-wave latency. Right ulnar F-wave latency was absent.  Bilateral median, left ulnar F-wave latency was prolonged.  Right tibial F-wave latency was absent. Electromyography: Selected needle examinations of right upper, lower  extremity muscles, right cervical and lumbosacral paraspinal muscles were performed. There was no evidence of axonal loss, no spontaneous activity, normal morphology motor unit potential was mildly decreased activation. There was polyphasic motor unit potential at right lumbosacral paraspinal muscles.  There was no spontaneous activity at the right cervical paraspinal muscles.   Conclusion: This is an abnormal study.  There is electrodiagnostic evidence of demyelinating polyradiculoneuropathy, there is no evidence of axonal loss. ------------------------------- Levert Feinstein, M.D. Ph.D. [facility] [address] [place], [place] [address] Tel: [phone] Fax: [phone] Verbal informed consent was obtained from the patient, patient was informed of potential risk of procedure, including bruising, bleeding, hematoma formation, infection, muscle weakness, muscle pain, numbness, among others.     MNC   Nerve / Sites Muscle Latency Ref. Amplitude Ref. Rel Amp Segments Distance Velocity Ref. Area   ms ms mV mV %  cm m/s m/s mVms R Median - APB    Wrist APB 5.6 ?4.4 6.2 ?4.0 100 Wrist - APB 7   27.5    Upper arm APB 9.9  5.6  89.6 Upper arm - Wrist 25 59 ?49 22.3 L Median - APB    Wrist APB 5.0 ?4.4 0.4 ?4.0 100 Wrist - APB 7   1.9    Upper arm APB 12.6  0.6  162 Upper arm - Wrist 25 33 ?49 1.3 R Ulnar - ADM    Wrist ADM 4.1 ?3.3 8.2 ?6.0 100 Wrist - ADM 7   23.9    B.Elbow ADM 6.4  8.7  105 B.Elbow - Wrist 10 44 ?49 27.5    A.Elbow ADM 10.4  6.7  77.8 A.Elbow - B.Elbow 19 47 ?49 23.1 L Ulnar - ADM    Wrist ADM 3.9 ?3.3 8.9 ?6.0 100 Wrist - ADM 7   35.2    B.Elbow ADM 6.1  7.4  83.4 B.Elbow - Wrist 10 46 ?49 29.6    A.Elbow ADM 10.6  7.8  106 A.Elbow - B.Elbow 20 44 ?49 30.0 R Peroneal - EDB  Ankle EDB 6.2 ?6.5 1.4 ?2.0 100 Ankle - EDB 9   4.5    Fib head EDB 14.6  0.2  17.3 Fib head - Ankle 24 29 ?44 0.5    Pop fossa EDB 20.3  0.0  7.5 Pop fossa - Fib head 13 23 ?44 0.0        Pop fossa - Ankle      L Peroneal - EDB    Ankle EDB 6.1 ?6.5 2.5 ?2.0 100 Ankle - EDB 9   7.9    Fib head EDB      Fib head - Ankle   ?44     Pop fossa EDB      Pop fossa - Fib head   ?44         Pop fossa - Ankle     R Tibial - AH    Ankle AH 7.5 ?5.8 1.4 ?4.0 100 Ankle - AH 9   3.3    Pop fossa AH      Pop fossa - Ankle   ?41  L Tibial - AH    Ankle AH NR ?5.8 NR ?4.0 NR Ankle - AH 9   NR                   SNC   Nerve / Sites Rec. Site Peak Lat Ref.  Amp Ref. Segments Distance   ms ms V V  cm R Radial - Anatomical snuff box (Forearm)    Forearm Wrist NR ?2.9 NR ?15 Forearm - Wrist 10 L Radial - Anatomical snuff box (Forearm)    Forearm Wrist NR ?2.9 NR ?15 Forearm - Wrist 10 R Superficial peroneal - Ankle    Lat leg Ankle NR ?4.4 NR ?6 Lat leg - Ankle 14 L Superficial peroneal - Ankle    Lat leg Ankle NR ?4.4 NR ?6 Lat leg - Ankle 14 R Median - Orthodromic (Dig II, Mid palm)    Dig II Wrist NR ?3.4 NR ?10 Dig II - Wrist 13 L Median - Orthodromic (Dig II, Mid palm)    Dig II Wrist NR ?3.4 NR ?10 Dig II - Wrist 13 R Ulnar - Orthodromic, (Dig V, Mid palm)    Dig V Wrist NR ?3.1 NR ?5 Dig V - Wrist 11 L Ulnar - Orthodromic, (Dig V, Mid palm)    Dig V Wrist NR ?3.1 NR ?5 Dig V - Wrist 61                     F  Wave   Nerve F Lat Ref.  ms ms R Median - APB 33.1 ?31.0 R Ulnar - ADM NR ?32.0 R Peroneal - EDB  NR ?56.0 R Tibial - AH NR ?56.0 L Median - APB 35.5 ?31.0 L Ulnar - ADM 33.8 ?32.0 L Peroneal - EDB NR ?56.0                 EMG Summary Table   Spontaneous MUAP Recruitment Muscle IA Fib PSW Fasc Other Amp Dur. Poly Pattern R. Tibialis anterior Normal None None None _______ Normal Normal Normal Normal R. Tibialis posterior Normal None None None _______ Normal Normal Normal Normal R. Peroneus longus Normal None None None _______ Normal Normal Normal Normal R. Gastrocnemius (Medial head) Normal None None None _______ Normal Normal Normal Normal R. Vastus lateralis Normal None None None _______ Normal Normal Normal Normal R. Lumbar  paraspinals (low) Normal None None None _______ Increased  Increased 1+ Reduced R. Lumbar paraspinals (mid) Normal None None None _______ Increased Increased 1+ Reduced R. First dorsal interosseous Normal None None None _______ Normal Normal Normal Normal R. Pronator teres Normal None None None _______ Normal Normal Normal Normal R. Biceps brachii Normal None None None _______ Normal Normal Normal Normal R. Deltoid Normal None None None _______ Normal Normal Normal Normal R. Triceps brachii Normal None None None _______ Normal Normal Normal Normal R. Cervical paraspinals Normal None None None _______ Normal Normal Normal Normal     Pending Labs Unresulted Labs (From admission, onward)     Start     Ordered   02/04/23 0500  Creatinine, serum  (enoxaparin (LOVENOX)    CrCl >/= 30 ml/min)  Weekly,   R     Comments: while on enoxaparin therapy    01/28/23 1446   01/29/23 0500  Comprehensive metabolic panel  Tomorrow morning,   R        01/28/23 1446   01/29/23 0500  CBC  Tomorrow morning,   R        01/28/23 1446            Vitals/Pain Today's Vitals   01/28/23 1235 01/28/23 1330 01/28/23 1405  BP: (!) 173/79  (!) 161/105  Pulse: 82 81 85  Resp: 18 17 13   Temp: 97.7 F (36.5 C)    TempSrc: Oral    SpO2: 98% 100% 100%    Isolation Precautions No active isolations  Medications Medications  enoxaparin (LOVENOX) injection 40 mg (0 mg Subcutaneous Hold 01/28/23 1800)  sodium chloride flush (NS) 0.9 % injection 3 mL (has no administration in time range)  polyethylene glycol (MIRALAX / GLYCOLAX) packet 17 g (has no administration in time range)  cyclobenzaprine (FLEXERIL) tablet 10 mg (has no administration in time range)  gabapentin (NEURONTIN) capsule 900 mg (has no administration in time range)  pantoprazole (PROTONIX) EC tablet 40 mg (has no administration in time range)  ALPRAZolam (XANAX) tablet 0.5 mg (has no administration in time range)  metoprolol tartrate (LOPRESSOR) tablet  50 mg (has no administration in time range)  furosemide (LASIX) tablet 40 mg (has no administration in time range)    Mobility non-ambulatory     Focused Assessments Neuro Assessment Handoff:  Swallow screen pass?  Cardiac Rhythm: Normal sinus rhythm       Neuro Assessment: Exceptions to [facility] Neuro Checks:      Has TPA been given? No If patient is a Neuro Trauma and patient is going to OR before floor call report to 4N Charge nurse: [phone] or [phone]   R Recommendations: See Admitting Provider Note  Report given to:   Additional Notes:

## 2023-01-28 NOTE — H&P (Signed)
History and Physical   Dana [name] ZOX:[id number] DOB: 1966-07-10 DOA: 01/28/2023  PCP: Nelwyn Salisbury, MD   Patient coming from: Home/neurologist office  Chief Complaint: Demyelinating polyradiculomyopathy  HPI: Dana [name] is a 57 y.o. female with medical history significant of chronic pain, spinal stenosis, menorrhagia, hypertension, hyperlipidemia, wheelchair dependence, obesity, IBS, anxiety presenting from neurologist office with concern for acute demyelinating disease.  History obtained with assistance of chart review.  Patient was at outpatient neurology today for follow-up after recent admission from 1/8 until 1/17 and then subsequent inpatient rehab from 1/17 until 2/21 for Guillain-Barr syndrome.  Patient has chronic pain and has been disabled but able to ambulate with a cane up until September 2023.  In October 2023 patient began to get numbness and tingling in her feet which began 2 weeks send up with her lower extremities.  And by November 2023 patient was having difficulty walking and started to have finger numbness and tingling and some hand weakness.  In December 2023 had worsening weakness and started having some GI issues.  And then as above in January 23 patient had worsening weakness and a fall and was admitted for suspected Guillain-Barr based off of clinical picture and lumbar puncture.  Treated with IVIG for 5 days with improvement per chart review.  Follow-up neurology visit today patient reports has not had significant improvement despite treatments in rehab.  And nerve conduction studies during that visit showed findings consistent with demyelinating polyradiculoneuropathy.  Patient sent to the ED for admission and inpatient neuroevaluation.  Patient denies fevers, chills, chest pain, shortness of breath, abdominal pain, constipation, diarrhea, nausea, vomiting.   ED Course: Vital signs in the ED notable for blood pressure in the 130s to 170 systolic.  Lab  workup included BMP with creatinine stable 126, glucose 106.  CBC within normal limits.  Neurology's been consulted and per EDP current plan is for plasmapheresis which may be tomorrow.  Review of Systems: As per HPI otherwise all other systems reviewed and are negative.  Past Medical History:  Diagnosis Date   Anxiety    Back pain with radiation    Breast mass, right    Chronic pain    Chronic, continuous use of opioids    Complication of anesthesia 04/07/14   Allergic reaction to Lisinopril immediately following surgery   DJD (degenerative joint disease)    Fibromyalgia    Groin abscess    Headache(784.0)    History of IBS    Hypercholesterolemia    IBS (irritable bowel syndrome)    Lactose intolerance    Mild hypertension    Obesity    Tobacco use disorder    Umbilical hernia    Symptomatic    Past Surgical History:  Procedure Laterality Date   ABDOMINAL HYSTERECTOMY     ANTERIOR CERVICAL DECOMP/DISCECTOMY FUSION N/A 12/20/2015   Procedure: ANTERIOR CERVICAL DECOMPRESSION FUSION CERVICAL 4-5, CERVICAL 5-6, CERVICAL 6-7 WITH INSTRUMENTATION AND ALLOGRAFT;  Surgeon: Estill Bamberg, MD;  Location: [facility] OR;  Service: Orthopedics;  Laterality: N/A;  Anterior cervical decompression fusion, cervical 4-5, cervical 5-6, cervical 6-7 with instrumentation and allograft   BACK SURGERY     BILATERAL SALPINGECTOMY  09/03/2012   Procedure: BILATERAL SALPINGECTOMY;  Surgeon: Ok Edwards, MD;  Location: [facility] ORS;  Service: Gynecology;  Laterality: Bilateral;   BREAST BIOPSY Right 04/07/2014   Procedure: REMOVAL RIGHT BREAST MASS WITH WIRE LOCALIZATION;  Surgeon: Adolph Pollack, MD;  Location: [facility] OR;  Service: General;  Laterality:  Right;   BREAST EXCISIONAL BIOPSY Left    x2   BREAST LUMPECTOMY     x2   CARPAL TUNNEL RELEASE Left 05/11/2019   Procedure: LEFT CARPAL TUNNEL RELEASE, RIGHT TENNIS ELBOW MARCAINE/DEPO MEDROL INJECTION UNDER ANESTHESIA;  Surgeon: Kerrin Champagne, MD;  Location:  [facility] OR;  Service: Orthopedics;  Laterality: Left;   COLONOSCOPY W/ BIOPSIES  04/24/2012   per Dr. Leone Payor, clear, repeat in 10 yrs    disectomy     ESOPHAGOGASTRODUODENOSCOPY     FINGER SURGERY     Right index-excision of mass    FOOT SURGERY Right    Bone Spurs   LAPAROSCOPIC HYSTERECTOMY  09/03/2012   Procedure: HYSTERECTOMY TOTAL LAPAROSCOPIC;  Surgeon: Ok Edwards, MD;  Location: [facility] ORS;  Service: Gynecology;  Laterality: N/A;   LUMBAR DISC SURGERY     TUBAL LIGATION     UMBILICAL HERNIA REPAIR N/A 05/01/2018   Procedure: UMBILICAL HERNIA REPAIR;  Surgeon: Jimmye Norman, MD;  Location: [facility] OR;  Service: General;  Laterality: N/A;    Social History  reports that she has quit smoking. Her smoking use included cigarettes. She has never used smokeless tobacco. She reports that she does not currently use alcohol. She reports that she does not currently use drugs.  Allergies  Allergen Reactions   Lisinopril Anaphylaxis    was hospitalized for 3 days   Codeine Hives   Cyanocobalamin [Vitamin B12] Itching    Patient able to take B12 injections with Benadryl.  Has no issues when taking B12 tablets.    Hydrocodone Hives   Penicillins Itching and Swelling    PATIENT HAS HAD A PCN REACTION WITH IMMEDIATE RASH, FACIAL/TONGUE/THROAT SWELLING, SOB, OR LIGHTHEADEDNESS WITH HYPOTENSION:  #  #  YES  #  #  Has patient had a PCN reaction causing severe rash involving mucus membranes or skin necrosis: No Has patient had a PCN reaction that required hospitalization No Has patient had a PCN reaction occurring within the last 10 years: No If all of the above answers are "NO", then may proceed with Cephalosporin use.    Amlodipine Swelling   Acetaminophen Rash    Family History  Problem Relation Age of Onset   Diabetes Mother    Diabetes Father    Breast cancer Maternal Aunt 46   Prostate cancer Paternal Grandfather   Reviewed on admission  Prior to Admission medications   Medication Sig  Start Date End Date Taking? Authorizing Provider  ALPRAZolam Prudy Feeler) 0.5 MG tablet Take 1 tablet (0.5 mg total) by mouth 2 (two) times daily as needed for anxiety. 12/05/22   Nelwyn Salisbury, MD  apixaban (ELIQUIS) 2.5 MG TABS tablet Take 1 tablet (2.5 mg total) by mouth 2 (two) times daily. Patient not taking: Reported on 01/28/2023 11/05/22   Milinda Antis, PA-C  cyanocobalamin (VITAMIN B12) 1000 MCG/ML injection Inject into the muscle. Patient not taking: Reported on 01/28/2023 12/07/22   [provider]  cyclobenzaprine (FLEXERIL) 10 MG tablet Take 1 tablet (10 mg total) by mouth 3 (three) times daily. 12/30/22   Lovorn, Aundra Millet, MD  dorzolamide-timolol (COSOPT) 2-0.5 % ophthalmic solution 1 drop 2 (two) times daily. 05/29/22   [provider]  fluticasone (FLONASE) 50 MCG/ACT nasal spray SPRAY 2 SPRAYS INTO EACH NOSTRIL EVERY DAY Patient taking differently: Place 2 sprays into both nostrils daily as needed for allergies. 01/10/22   Nelwyn Salisbury, MD  folic acid (FOLVITE) 1 MG tablet Take 1 tablet (1 mg  total) by mouth daily. 09/02/22   Nelwyn Salisbury, MD  furosemide (LASIX) 40 MG tablet Take 1 tablet (40 mg total) by mouth 2 (two) times daily. 12/05/22   Nelwyn Salisbury, MD  gabapentin (NEURONTIN) 300 MG capsule Take 3 capsules (900 mg total) by mouth 3 (three) times daily. 12/30/22   Lovorn, Aundra Millet, MD  hydrocerin (EUCERIN) CREA Apply 1 Application topically 2 (two) times daily. 11/05/22   Setzer, Lynnell Jude, PA-C  HYDROmorphone (DILAUDID) 4 MG tablet Take 1 tablet (4 mg total) by mouth every 6 (six) hours as needed for severe pain. 01/28/23 02/27/23  Nelwyn Salisbury, MD  latanoprost (XALATAN) 0.005 % ophthalmic solution 1 drop at bedtime. 12/13/21   [provider]  magnesium oxide (MAG-OX) 400 MG tablet Take 1 tablet (400 mg total) by mouth at bedtime. 12/05/22   Nelwyn Salisbury, MD  metoprolol tartrate (LOPRESSOR) 50 MG tablet Take 1 tablet (50 mg total) by mouth 2 (two) times daily.  12/05/22   Nelwyn Salisbury, MD  Multiple Vitamin (MULTIVITAMIN WITH MINERALS) TABS tablet Take 1 tablet by mouth daily. 10/02/22   Regalado, Belkys A, MD  pantoprazole (PROTONIX) 40 MG tablet Take 1 tablet (40 mg total) by mouth daily. 12/05/22   Nelwyn Salisbury, MD  potassium chloride (KLOR-CON) 10 MEQ tablet Take 10 mEq by mouth 2 (two) times daily. 11/28/22   [provider]  senna-docusate (SENOKOT-S) 8.6-50 MG tablet Take 1 tablet by mouth 2 (two) times daily. 11/05/22   Setzer, Lynnell Jude, PA-C  traZODone (DESYREL) 50 MG tablet TAKE 1-2 TABLETS BY MOUTH AT BEDTIME AS NEEDED FOR SLEEP. 01/21/23   Genice Rouge, MD    Physical Exam: Vitals:   01/28/23 1235 01/28/23 1330 01/28/23 1405 01/28/23 1629  BP: (!) 173/79  (!) 161/105   Pulse: 82 81 85   Resp: 18 17 13    Temp: 97.7 F (36.5 C)   98.1 F (36.7 C)  TempSrc: Oral   Oral  SpO2: 98% 100% 100%     Physical Exam Constitutional:      General: She is not in acute distress.    Appearance: Normal appearance.  HENT:     Head: Normocephalic and atraumatic.     Mouth/Throat:     Mouth: Mucous membranes are moist.     Pharynx: Oropharynx is clear.  Eyes:     Extraocular Movements: Extraocular movements intact.     Pupils: Pupils are equal, round, and reactive to light.  Cardiovascular:     Rate and Rhythm: Normal rate and regular rhythm.     Pulses: Normal pulses.     Heart sounds: Normal heart sounds.  Pulmonary:     Effort: Pulmonary effort is normal. No respiratory distress.     Breath sounds: Normal breath sounds.  Abdominal:     General: Bowel sounds are normal. There is no distension.     Palpations: Abdomen is soft.     Tenderness: There is no abdominal tenderness.  Musculoskeletal:        General: No swelling or deformity.  Skin:    General: Skin is warm and dry.  Neurological:     Mental Status: Mental status is at baseline.     Comments: Reports decree sensation in fingertips and toes.  Reports difficulty with  bending legs.  Strength grossly intact but reduced.    Labs on Admission: I have personally reviewed following labs and imaging studies  CBC: Recent Labs  Lab 01/28/23 1320  WBC 9.1  NEUTROABS 6.3  HGB 13.9  HCT 43.8  MCV 89.4  PLT 289    Basic Metabolic Panel: Recent Labs  Lab 01/28/23 1320  NA 140  K 4.2  CL 102  CO2 24  GLUCOSE 106*  BUN 11  CREATININE 1.06*  CALCIUM 9.7    GFR: Estimated Creatinine Clearance: 79.1 mL/min (A) (by C-G formula based on SCr of 1.06 mg/dL (H)).  Liver Function Tests: No results for input(s): "AST", "ALT", "ALKPHOS", "BILITOT", "PROT", "ALBUMIN" in the last 168 hours.  Urine analysis:    Component Value Date/Time   COLORURINE AMBER (A) 09/23/2022 2255   APPEARANCEUR CLOUDY (A) 09/23/2022 2255   LABSPEC 1.024 09/23/2022 2255   PHURINE 5.0 09/23/2022 2255   GLUCOSEU NEGATIVE 09/23/2022 2255   GLUCOSEU NEGATIVE 05/03/2022 1345   HGBUR NEGATIVE 09/23/2022 2255   BILIRUBINUR NEGATIVE 09/23/2022 2255   BILIRUBINUR neg 12/31/2021 1311   KETONESUR 5 (A) 09/23/2022 2255   PROTEINUR 30 (A) 09/23/2022 2255   UROBILINOGEN 1.0 05/03/2022 1345   NITRITE POSITIVE (A) 09/23/2022 2255   LEUKOCYTESUR SMALL (A) 09/23/2022 2255    Radiological Exams on Admission: NCV with EMG(electromyography)  Result Date: 01/28/2023 Levert Feinstein, MD     01/28/2023  1:03 PM     Full Name: Jannett Lagle Gender: Female MRN #: [id number] Date of Birth: 13-Nov-1965   Visit Date: 01/28/2023 09:17 Age: 81 Years Examining Physician: Dr. Levert Feinstein Referring Physician: Dr. Levert Feinstein Height: 5 feet 4 inch History: 57 year old female, presenting with ascending paresthesia, weakness, gait abnormality Summary of the tests: Nerve conduction study: Right sural, superficial peroneal, bilateral median, ulnar and radial sensory responses were absent. Left tibial motor responses were absent.  Left peroneal to EDB motor response showed normal CMAP amplitude at distal stimulation site,  but was not able to elicit any response at proximal stimulation site. Right tibial motor responses showed significantly decreased CMAP amplitude, was not able to elicit any response at proximal stimulation side. Bilateral ulnar, right median motor responses showed moderately prolonged distal latency, F-wave latency. Right ulnar F-wave latency was absent.  Bilateral median, left ulnar F-wave latency was prolonged.  Right tibial F-wave latency was absent. Electromyography: Selected needle examinations of right upper, lower extremity muscles, right cervical and lumbosacral paraspinal muscles were performed. There was no evidence of axonal loss, no spontaneous activity, normal morphology motor unit potential was mildly decreased activation. There was polyphasic motor unit potential at right lumbosacral paraspinal muscles.  There was no spontaneous activity at the right cervical paraspinal muscles.   Conclusion: This is an abnormal study.  There is electrodiagnostic evidence of demyelinating polyradiculoneuropathy, there is no evidence of axonal loss. ------------------------------- Levert Feinstein, M.D. Ph.D. [facility] [address] [place], [place] [address] Tel: [phone] Fax: [phone] Verbal informed consent was obtained from the patient, patient was informed of potential risk of procedure, including bruising, bleeding, hematoma formation, infection, muscle weakness, muscle pain, numbness, among others.     MNC   Nerve / Sites Muscle Latency Ref. Amplitude Ref. Rel Amp Segments Distance Velocity Ref. Area   ms ms mV mV %  cm m/s m/s mVms R Median - APB    Wrist APB 5.6 ?4.4 6.2 ?4.0 100 Wrist - APB 7   27.5    Upper arm APB 9.9  5.6  89.6 Upper arm - Wrist 25 59 ?49 22.3 L Median - APB    Wrist APB 5.0 ?4.4 0.4 ?4.0 100 Wrist - APB 7  1.9    Upper arm APB 12.6  0.6  162 Upper arm - Wrist 25 33 ?49 1.3 R Ulnar - ADM    Wrist ADM 4.1 ?3.3 8.2 ?6.0 100 Wrist - ADM 7   23.9    B.Elbow ADM 6.4   8.7  105 B.Elbow - Wrist 10 44 ?49 27.5    A.Elbow ADM 10.4  6.7  77.8 A.Elbow - B.Elbow 19 47 ?49 23.1 L Ulnar - ADM    Wrist ADM 3.9 ?3.3 8.9 ?6.0 100 Wrist - ADM 7   35.2    B.Elbow ADM 6.1  7.4  83.4 B.Elbow - Wrist 10 46 ?49 29.6    A.Elbow ADM 10.6  7.8  106 A.Elbow - B.Elbow 20 44 ?49 30.0 R Peroneal - EDB    Ankle EDB 6.2 ?6.5 1.4 ?2.0 100 Ankle - EDB 9   4.5    Fib head EDB 14.6  0.2  17.3 Fib head - Ankle 24 29 ?44 0.5    Pop fossa EDB 20.3  0.0  7.5 Pop fossa - Fib head 13 23 ?44 0.0        Pop fossa - Ankle     L Peroneal - EDB    Ankle EDB 6.1 ?6.5 2.5 ?2.0 100 Ankle - EDB 9   7.9    Fib head EDB      Fib head - Ankle   ?44     Pop fossa EDB      Pop fossa - Fib head   ?44         Pop fossa - Ankle     R Tibial - AH    Ankle AH 7.5 ?5.8 1.4 ?4.0 100 Ankle - AH 9   3.3    Pop fossa AH      Pop fossa - Ankle   ?41  L Tibial - AH    Ankle AH NR ?5.8 NR ?4.0 NR Ankle - AH 9   NR                   SNC   Nerve / Sites Rec. Site Peak Lat Ref.  Amp Ref. Segments Distance   ms ms V V  cm R Radial - Anatomical snuff box (Forearm)    Forearm Wrist NR ?2.9 NR ?15 Forearm - Wrist 10 L Radial - Anatomical snuff box (Forearm)    Forearm Wrist NR ?2.9 NR ?15 Forearm - Wrist 10 R Superficial peroneal - Ankle    Lat leg Ankle NR ?4.4 NR ?6 Lat leg - Ankle 14 L Superficial peroneal - Ankle    Lat leg Ankle NR ?4.4 NR ?6 Lat leg - Ankle 14 R Median - Orthodromic (Dig II, Mid palm)    Dig II Wrist NR ?3.4 NR ?10 Dig II - Wrist 13 L Median - Orthodromic (Dig II, Mid palm)    Dig II Wrist NR ?3.4 NR ?10 Dig II - Wrist 13 R Ulnar - Orthodromic, (Dig V, Mid palm)    Dig V Wrist NR ?3.1 NR ?5 Dig V - Wrist 11 L Ulnar - Orthodromic, (Dig V, Mid palm)    Dig V Wrist NR ?3.1 NR ?5 Dig V - Wrist 46                     F  Wave   Nerve F Lat Ref.  ms ms R Median - APB 33.1 ?31.0 R Ulnar - ADM NR ?32.0  R Peroneal - EDB  NR ?56.0 R Tibial - AH NR ?56.0 L Median - APB 35.5 ?31.0 L Ulnar - ADM 33.8 ?32.0 L Peroneal - EDB NR ?56.0                  EMG Summary Table   Spontaneous MUAP Recruitment Muscle IA Fib PSW Fasc Other Amp Dur. Poly Pattern R. Tibialis anterior Normal None None None _______ Normal Normal Normal Normal R. Tibialis posterior Normal None None None _______ Normal Normal Normal Normal R. Peroneus longus Normal None None None _______ Normal Normal Normal Normal R. Gastrocnemius (Medial head) Normal None None None _______ Normal Normal Normal Normal R. Vastus lateralis Normal None None None _______ Normal Normal Normal Normal R. Lumbar paraspinals (low) Normal None None None _______ Increased Increased 1+ Reduced R. Lumbar paraspinals (mid) Normal None None None _______ Increased Increased 1+ Reduced R. First dorsal interosseous Normal None None None _______ Normal Normal Normal Normal R. Pronator teres Normal None None None _______ Normal Normal Normal Normal R. Biceps brachii Normal None None None _______ Normal Normal Normal Normal R. Deltoid Normal None None None _______ Normal Normal Normal Normal R. Triceps brachii Normal None None None _______ Normal Normal Normal Normal R. Cervical paraspinals Normal None None None _______ Normal Normal Normal Normal     EKG: Independently reviewed.  Sinus rhythm at 86 bpm.  Some baseline artifact.  Assessment/Plan Principal Problem:   Acute inflammatory demyelinating polyradiculoneuropathic form of Guillain-Barre syndrome (HCC) Active Problems:   HYPERCHOLESTEROLEMIA   Obesity   Anxiety state   IBS (irritable bowel syndrome) - diarrhea predominant   HTN (hypertension)   Spinal stenosis of lumbar region with neurogenic claudication   Wheelchair dependence   CIDP (chronic inflammatory demyelinating polyneuropathy) (HCC)   Glaucoma   Acute demyelinating polyradiculoneuropathy > Progressive weakness, previously treated for GBS with 5 days of IVIG at per chart review showed improvement however patient at follow-up neurology visit today states she has not had much improvement. >  EMG studies today at neurology showed evidence of acute demyelinating disease ongoing.  Sent to the ED for further workup and inpatient treatment. > Neurology consulted by EDP and plans for plasmapheresis likely tomorrow. - Appreciate neurology recommendations and assistance - Monitor on telemetry - Plan for plasmapheresis tomorrow - Final workup per neurology team, outpatient neurology requested protein electrophoresis, ANA, SSA, SSB, iron panel and are working on IVIG prior Auth for outpatient treatment  Chronic pain Spinal stenosis - Continue home Flexeril, gabapentin, as needed Dilaudid  Hypertension - Continue home Lasix and metoprolol  Anxiety - Continue home trazodone and as needed Xanax  GERD - Continue home PPI  Glaucoma - Continue home Eyedrops  DVT prophylaxis: Lovenox Code Status:   Full Family Communication:  None on admission, she has been updating her husband  Disposition Plan:   Patient is from:  Home  Anticipated DC to:  Pending clinical course  Anticipated DC date:  Pending clinical course  Anticipated DC barriers: If further rehab is needed  Consults called:  Neurology consulted in the ED, following. Admission status:  Inpatient, telemetry  Severity of Illness: The appropriate patient status for this patient is INPATIENT. Inpatient status is judged to be reasonable and necessary in order to provide the required intensity of service to ensure the patient's safety. The patient's presenting symptoms, physical exam findings, and initial radiographic and laboratory data in the context of their chronic comorbidities is felt to place them at high risk for  further clinical deterioration. Furthermore, it is not anticipated that the patient will be medically stable for discharge from the hospital within 2 midnights of admission.   * I certify that at the point of admission it is my clinical judgment that the patient will require inpatient hospital care spanning beyond 2  midnights from the point of admission due to high intensity of service, high risk for further deterioration and high frequency of surveillance required.Synetta Fail MD [facility]  How to contact the [facility] Attending or Consulting provider 7A - 7P or covering provider during after hours 7P -7A, for this patient?   Check the care team in [facility] and look for a) attending/consulting [facility] provider listed and b) the [facility] team listed Log into [email or web address] and use [facility]'s universal password to access. If you do not have the password, please contact the hospital operator. Locate the [facility] provider you are looking for under [facility] and page to a number that you can be directly reached. If you still have difficulty reaching the provider, please page the [facility] (Director on Call) for the Hospitalists listed on amion for assistance.  01/28/2023, 4:55 PM

## 2023-01-28 NOTE — Telephone Encounter (Signed)
Pt was notified.  

## 2023-01-28 NOTE — Progress Notes (Addendum)
Chief Complaint  Patient presents with   New Patient (Initial Visit)    Rm 14, with husband [name], GBS, new onset 09/23/2022, hands burning, needs assistance with all ADL's except feeding      ASSESSMENT AND PLAN  Dana [name] is a 57 y.o. female   Demyelinating polyradiculoneuropathy  Symptom onset monophasic since September 2024, failed to respond to IVIG in January 2024,  EMG nerve conduction study showed demyelinating features, no evidence of axonal loss,  Talk with neurohospitalist Dr. Iver Nestle, ED triage, will send her for hospital admission for plasma exchange, please add on following labs, immunofixative protein electrophoresis, ANA with reflex, SSA, SSB titer, iron panel  Starting outpatient IVIG prior authorization process,  DIAGNOSTIC DATA (LABS, IMAGING, TESTING) - I reviewed patient records, labs, notes, testing and imaging myself where available. CSF in Jan 2024, TP 155, Glucose 70,  RBC 17, 750  MEDICAL HISTORY:  Dana [name], is a 57 year old female, accompanied by her husband seen in request by her primary care from [facility] Dr. 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 PMHX Chronic insomnia HTN Neuropathic pain--gabapentin 900mg  tid B12 deficiency. Obesity Anxiety  Patient had long history of chronic joint pain, lumbar, cervical decompression surgery in the past, obesity, went on disability few years ago, poor functional status, but at her baseline around September 2023, she was able to ambulate with a cane, independent in daily activity, has a lot of knee pain.  Around October 2023, she began to notice numbness tingling starting at the bottom of her feet, quickly ascending to involving both feet, lower extremity, by November 2023, she began to notice increased difficulty walking, difficulty getting into her bed, began to  notice fingertip paresthesia, weakness of her hands,  In December 2023, she had such difficulty, could not put Her Christmas tree, by end of December, she began to have frequent GI symptoms, nausea vomiting, throwing up, numbness continue ascending to lower pelvic region, she cannot feel herself when wiping, difficulty initiate urine,  Presented to hospital on September 23, 2022, with worsening weakness, numbness, fell, could not get up  She was diagnosed with possible Guillain-Barr, based on abnormal spinal fluid testing, bloody tap, total protein was 155,  She was treated with IVIG 2 g/kg total divided into 5 days, January 10 to 15, 2024  She denies significant improvement, was discharged to inpatient rehabilitation, now back home, she is bound to electronic wheelchair, no longer ambulatory, needing help transfer, significant bilateral knee pain, continue have numbness tingling to lower pelvic region, also have hands numbness tingling, upper extremity weakness  Personally reviewed MRI all cervical spine, previous ACDF at C4-7 without residual spinal canal stenosis, multilevel degenerative changes, MRI of thoracic spine showed no significant abnormality,  MRI of the lumbar spine showed multilevel degenerative changes,moderate spinal stenosis at L3-4, epidural lipomatosis with resultant moderate to severe spinal stenosis at L4-5,   CT head showed no significant abnormality   Extensive laboratory evaluation since December 2023, mild elevation of A1c 6.1, B12 was 150, repeat level was 1069, negative HIV, hepatitis A, CMV, respiratory panel, TSH, folic acid, elevated ESR of 38, C-reactive protein of 5.9, normal aldolase, copper, vitamin D slightly decreased 6.8, ant-GM1, GQ antibody,  Drug screen was positive for amrijuana  EMG nerve conduction study showed absent sensory responses of upper and lower extremities, prolonged F-wave latency at upper extremity motor response,  no evidence of axonal  loss   PHYSICAL EXAM:   Vitals:   01/28/23 0816  BP: 136/84  Weight: 291 lb (132 kg)  Height: 5\' 4"  (1.626 m)    Body mass index is 49.95 kg/m.  PHYSICAL EXAMNIATION:  Gen: NAD, conversant, well nourised, well groomed                     Cardiovascular: Regular rate rhythm, no peripheral edema, warm, nontender. Eyes: Conjunctivae clear without exudates or hemorrhage Neck: Supple, no carotid bruits. Pulmonary: Clear to auscultation bilaterally   NEUROLOGICAL EXAM:  MENTAL STATUS: Speech/cognition: Obese, depressed looking middle-age female, in electronic wheelchair, awake, alert, oriented to history taking and casual conversation CRANIAL NERVES: CN II: Visual fields are full to confrontation. Pupils are round equal and briskly reactive to light. CN III, IV, VI: extraocular movement are normal. No ptosis. CN V: Facial sensation is intact to light touch CN VII: Face is symmetric with normal eye closure  CN VIII: Hearing is normal to causal conversation. CN IX, X: Phonation is normal. CN XI: Head turning and shoulder shrug are intact  MOTOR: Mild bilateral upper extremity proximal and distal weakness,  Barely antigravity movement of bilateral hip flexion, mild weakness of bilateral knee flexion extension and ankle dorsiflexion  REFLEXES: Areflexia  SENSORY: Absent vibratory sensation at lower extremity, decreased at upper extremity to elbow level, length-dependent decreased to pinprick to upper thigh level,  COORDINATION: There is no trunk or limb dysmetria noted.  GAIT/STANCE: Deferred  REVIEW OF SYSTEMS:  Full 14 system review of systems performed and notable only for as above All other review of systems were negative.   ALLERGIES: Allergies  Allergen Reactions   Lisinopril Anaphylaxis    was hospitalized for 3 days   Codeine Hives   Cyanocobalamin [Vitamin B12] Itching    Patient able to take B12 injections with Benadryl.  Has no issues when taking B12  tablets.    Hydrocodone Hives   Penicillins Itching and Swelling    PATIENT HAS HAD A PCN REACTION WITH IMMEDIATE RASH, FACIAL/TONGUE/THROAT SWELLING, SOB, OR LIGHTHEADEDNESS WITH HYPOTENSION:  #  #  YES  #  #  Has patient had a PCN reaction causing severe rash involving mucus membranes or skin necrosis: No Has patient had a PCN reaction that required hospitalization No Has patient had a PCN reaction occurring within the last 10 years: No If all of the above answers are "NO", then may proceed with Cephalosporin use.    Amlodipine Swelling   Acetaminophen Rash    HOME MEDICATIONS: Current Outpatient Medications  Medication Sig Dispense Refill   ALPRAZolam (XANAX) 0.5 MG tablet Take 1 tablet (0.5 mg total) by mouth 2 (two) times daily as needed for anxiety. 180 tablet 1   cyclobenzaprine (FLEXERIL) 10 MG tablet Take 1 tablet (10 mg total) by mouth 3 (three) times daily. 90 tablet 5   dorzolamide-timolol (COSOPT) 2-0.5 % ophthalmic solution 1 drop 2 (two) times daily.     fluticasone (FLONASE) 50 MCG/ACT nasal spray SPRAY 2 SPRAYS INTO EACH NOSTRIL EVERY DAY (Patient taking differently: Place 2 sprays into both nostrils daily as needed for allergies.) 16 mL 11   folic acid (FOLVITE) 1 MG tablet Take 1 tablet (1 mg total) by mouth daily. 90 tablet 3   furosemide (LASIX) 40 MG tablet Take 1 tablet (40 mg total) by mouth 2 (two) times daily. 180 tablet 3   gabapentin (NEURONTIN) 300 MG capsule Take 3 capsules (  900 mg total) by mouth 3 (three) times daily. 270 capsule 5   hydrocerin (EUCERIN) CREA Apply 1 Application topically 2 (two) times daily.  0   latanoprost (XALATAN) 0.005 % ophthalmic solution 1 drop at bedtime.     magnesium oxide (MAG-OX) 400 MG tablet Take 1 tablet (400 mg total) by mouth at bedtime. 90 tablet 3   metoprolol tartrate (LOPRESSOR) 50 MG tablet Take 1 tablet (50 mg total) by mouth 2 (two) times daily. 180 tablet 3   Multiple Vitamin (MULTIVITAMIN WITH MINERALS) TABS  tablet Take 1 tablet by mouth daily. 30 tablet 0   pantoprazole (PROTONIX) 40 MG tablet Take 1 tablet (40 mg total) by mouth daily. 90 tablet 3   potassium chloride (KLOR-CON) 10 MEQ tablet Take 10 mEq by mouth 2 (two) times daily.     senna-docusate (SENOKOT-S) 8.6-50 MG tablet Take 1 tablet by mouth 2 (two) times daily.     traZODone (DESYREL) 50 MG tablet TAKE 1-2 TABLETS BY MOUTH AT BEDTIME AS NEEDED FOR SLEEP. 180 tablet 1   apixaban (ELIQUIS) 2.5 MG TABS tablet Take 1 tablet (2.5 mg total) by mouth 2 (two) times daily. (Patient not taking: Reported on 01/28/2023) 60 tablet 0   cyanocobalamin (VITAMIN B12) 1000 MCG/ML injection Inject into the muscle. (Patient not taking: Reported on 01/28/2023)     No current facility-administered medications for this visit.    PAST MEDICAL HISTORY: Past Medical History:  Diagnosis Date   Anxiety    Back pain with radiation    Breast mass, right    Chronic pain    Chronic, continuous use of opioids    Complication of anesthesia 04/07/14   Allergic reaction to Lisinopril immediately following surgery   DJD (degenerative joint disease)    Fibromyalgia    Groin abscess    Headache(784.0)    History of IBS    Hypercholesterolemia    IBS (irritable bowel syndrome)    Lactose intolerance    Mild hypertension    Obesity    Tobacco use disorder    Umbilical hernia    Symptomatic    PAST SURGICAL HISTORY: Past Surgical History:  Procedure Laterality Date   ABDOMINAL HYSTERECTOMY     ANTERIOR CERVICAL DECOMP/DISCECTOMY FUSION N/A 12/20/2015   Procedure: ANTERIOR CERVICAL DECOMPRESSION FUSION CERVICAL 4-5, CERVICAL 5-6, CERVICAL 6-7 WITH INSTRUMENTATION AND ALLOGRAFT;  Surgeon: Estill Bamberg, MD;  Location: [facility] OR;  Service: Orthopedics;  Laterality: N/A;  Anterior cervical decompression fusion, cervical 4-5, cervical 5-6, cervical 6-7 with instrumentation and allograft   BACK SURGERY     BILATERAL SALPINGECTOMY  09/03/2012   Procedure: BILATERAL  SALPINGECTOMY;  Surgeon: Ok Edwards, MD;  Location: [facility] ORS;  Service: Gynecology;  Laterality: Bilateral;   BREAST BIOPSY Right 04/07/2014   Procedure: REMOVAL RIGHT BREAST MASS WITH WIRE LOCALIZATION;  Surgeon: Adolph Pollack, MD;  Location[facility] OR;  Service: General;  Laterality: Right;   BREAST EXCISIONAL BIOPSY Left    x2   BREAST LUMPECTOMY     x2   CARPAL TUNNEL RELEASE Left 05/11/2019   Procedure: LEFT CARPAL TUNNEL RELEASE, RIGHT TENNIS ELBOW MARCAINE/DEPO MEDROL INJECTION UNDER ANESTHESIA;  Surgeon: Kerrin Champagne, MD;  Location: [facility] OR;  Service: Orthopedics;  Laterality: Left;   COLONOSCOPY W/ BIOPSIES  04/24/2012   per Dr. Leone Payor, clear, repeat in 10 yrs    disectomy     ESOPHAGOGASTRODUODENOSCOPY     FINGER SURGERY     Right index-excision of mass  FOOT SURGERY Right    Bone Spurs   LAPAROSCOPIC HYSTERECTOMY  09/03/2012   Procedure: HYSTERECTOMY TOTAL LAPAROSCOPIC;  Surgeon: Ok Edwards, MD;  Location: [facility] ORS;  Service: Gynecology;  Laterality: N/A;   LUMBAR DISC SURGERY     TUBAL LIGATION     UMBILICAL HERNIA REPAIR N/A 05/01/2018   Procedure: UMBILICAL HERNIA REPAIR;  Surgeon: Jimmye Norman, MD;  Location: [facility] OR;  Service: General;  Laterality: N/A;    FAMILY HISTORY: Family History  Problem Relation Age of Onset   Diabetes Mother    Diabetes Father    Breast cancer Maternal Aunt 46   Prostate cancer Paternal Grandfather     SOCIAL HISTORY: Social History   Socioeconomic History   Marital status: Married    Spouse name: Veneta Cicconi   Number of children: 4   Years of education: Not on file   Highest education level: Not on file  Occupational History   Occupation: Psychologist, educational  Tobacco Use   Smoking status: Former    Years: 33    Types: Cigarettes   Smokeless tobacco: Never   Tobacco comments:    quit March 2017  Vaping Use   Vaping Use: Never used  Substance and Sexual Activity   Alcohol use: Not Currently   Drug use: Not  Currently    Comment: chronic use of dilaudid   Sexual activity: Not Currently    Birth control/protection: Surgical  Other Topics Concern   Not on file  Social History Narrative   Married - lives with husband 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, fashion/clothing    4 grown children does not live with her - 1985, 1988, 77 (twins)Grandchildren - 4Updated 10/12/2013   Right handed   Caffeine-none   Social Determinants of Health   Financial Resource Strain: Low Risk  (05/06/2022)   Overall Financial Resource Strain (CARDIA)    Difficulty of Paying Living Expenses: Not hard at all  Food Insecurity: No Food Insecurity (05/06/2022)   Hunger Vital Sign    Worried About Running Out of Food in the Last Year: Never true    Ran Out of Food in the Last Year: Never true  Transportation Needs: No Transportation Needs (05/06/2022)   PRAPARE - Administrator, Civil Service (Medical): No    Lack of Transportation (Non-Medical): No  Physical Activity: Inactive (05/06/2022)   Exercise Vital Sign    Days of Exercise per Week: 0 days    Minutes of Exercise per Session: 0 min  Stress: No Stress Concern Present (05/06/2022)   [employer] of Occupational Health - Occupational Stress Questionnaire    Feeling of Stress : Not at all  Social Connections: Not on file  Intimate Partner Violence: Not on file      Levert Feinstein, M.D. Ph.D.  [facility] [address] [place], [place] [address] Ph: [phone] Fax: [phone]  CC:  Milinda Antis, PA-C [address] [place],  [place] [address]  Nelwyn Salisbury, MD    Total time spent reviewing the chart, obtaining history, examined patient, ordering tests, documentation, consultations and family, care coordination was  70 minutes

## 2023-01-28 NOTE — Consult Note (Signed)
Neurology Consultation    Reason for Consult:  CC: lower extremity weakness  HISTORY OF PRESENT ILLNESS   HPI  Dana [name] is a 57 y.o. female with a past medical history of B12 deficiency, anxiety, back pain, right breast mass, chronic opioid use, DJD, fibromyalgia, headache, hyperal cholesterolemia, mild hypertension, obesity, and tobacco use who was admitted in January for what was thought to be Guillain-Barr syndrome.  She presents today from her neurology office.  She had a nerve conduction study today that showed evidence of demyelinating polyradiculoneuropathy. She was released from CIR on 2/21 and at that time she still was not able to walk independently and had left leg numbness. She initially had home health and then began doing outpatient PT at [facility]. On Monday last week she sat on the side of her bed and noticed that she was "not feeling right" and then went about her day.  Recommended by Dr Terrace Arabia- Renal fixative protein electrophoresis, ANA with reflex, SSA, SSB titer, iron panel,  anti-MOG - negative GM1 Antibody, IgG - 3 GM1 Antibody IgM- 5   History is obtained from:Patient, Husband    ROS: Negative except as documented in HPI.  PAST MEDICAL HISTORY    Past Medical History:  Past Medical History:  Diagnosis Date   Anxiety    Back pain with radiation    Breast mass, right    Chronic pain    Chronic, continuous use of opioids    Complication of anesthesia 04/07/14   Allergic reaction to Lisinopril immediately following surgery   DJD (degenerative joint disease)    Fibromyalgia    Groin abscess    Headache(784.0)    History of IBS    Hypercholesterolemia    IBS (irritable bowel syndrome)    Lactose intolerance    Mild hypertension    Obesity    Tobacco use disorder    Umbilical hernia    Symptomatic    No family history on file. Family History  Problem Relation Age of Onset   Diabetes Mother    Diabetes Father    Breast cancer Maternal Aunt  70   Prostate cancer Paternal Grandfather     Allergies:  Allergies  Allergen Reactions   Lisinopril Anaphylaxis    was hospitalized for 3 days   Codeine Hives   Cyanocobalamin [Vitamin B12] Itching    Patient able to take B12 injections with Benadryl.  Has no issues when taking B12 tablets.    Hydrocodone Hives   Penicillins Itching and Swelling    PATIENT HAS HAD A PCN REACTION WITH IMMEDIATE RASH, FACIAL/TONGUE/THROAT SWELLING, SOB, OR LIGHTHEADEDNESS WITH HYPOTENSION:  #  #  YES  #  #  Has patient had a PCN reaction causing severe rash involving mucus membranes or skin necrosis: No Has patient had a PCN reaction that required hospitalization No Has patient had a PCN reaction occurring within the last 10 years: No If all of the above answers are "NO", then may proceed with Cephalosporin use.    Amlodipine Swelling   Acetaminophen Rash    Social History:   reports that she has quit smoking. Her smoking use included cigarettes. She has never used smokeless tobacco. She reports that she does not currently use alcohol. She reports that she does not currently use drugs.    Medications (Not in a hospital admission)   EXAMINATION    Current vital signs:    01/28/2023   12:35 PM 01/28/2023    8:16 AM 12/30/2022  11:26 AM  Vitals with BMI  Height  5\' 4"  5\' 4"   Weight  291 lbs   BMI  49.93   Systolic 173 136 409  Diastolic 79 84 86  Pulse 82  78    Examination:  General: Awake alert in no distress HEENT: Normocephalic atraumatic Lungs: Clear Cardiovascular: Regular rhythm Abdomen nondistended nontender Neurological exam She is awake alert oriented x 3 No evidence of dysarthria. No evidence of aphasia Cranial nerves II to XII intact Motor examination reveals full 5/5 strength in the upper extremities bilaterally.  Lower extremities examination reveals left hip flexors barely 3/5.  Right hip flexors 4 -/5.  Distal muscle weakness bilateral lower extremities at knee and  ankle 4-4+/5 Sensation diminished in a stocking pattern in the lower extremities but also on a big patch on her left thigh in a pattern more consistent with meralgia paresthetica. DTRs-areflexic Coordination: No dysmetria in the upper extremities.  Unable to perform in the lower extremities   LABS   I have reviewed labs in epic and the results pertinent to this consultation are:  Lab Results  Component Value Date   LDLCALC 135 (H) 09/05/2022   Lab Results  Component Value Date   ALT 31 10/03/2022   AST 39 10/03/2022   ALKPHOS 69 10/03/2022   BILITOT 0.7 10/03/2022   Lab Results  Component Value Date   HGBA1C 6.1 09/05/2022   Lab Results  Component Value Date   WBC 7.6 11/04/2022   HGB 9.9 (L) 11/04/2022   HCT 31.1 (L) 11/04/2022   MCV 91.5 11/04/2022   PLT 308 11/04/2022   Lab Results  Component Value Date   VITAMINB12 1,809 (H) 09/23/2022   Lab Results  Component Value Date   FOLATE 6.6 09/23/2022   Lab Results  Component Value Date   NA 139 11/06/2022   K 4.8 11/06/2022   CL 105 11/06/2022   CO2 25 11/06/2022     DIAGNOSTIC IMAGING/PROCEDURES   I have reviewed the images obtained:, as below  MRI of the lumbar spine with DJD  ASSESSMENT/PLAN    Assessment:  57 y.o. female with a pertinent medical history of Guillain-Barr a syndrome presenting for worsening weakness and EMG nerve conduction consistent with demyelinating polyradiculoneuropathy, with symptoms going on since January-greater than 3 months, likely CIDP. Dr. Terrace Arabia, outpatient neurologist recommends Plex for which she will be admitted to the hospitalist.  Impression: Progressive acute demyelinating polyradiculoneuropathy  Recommendations: Admit to hospitalist Check UA chest x-ray Plasma exchange for 5 rounds every other day likely starting tomorrow or day after. Neurology to continue to follow   Attending Neurohospitalist Addendum Patient seen and examined with APP/Resident. Agree with  the history and physical as documented above. Agree with the plan as documented, which I helped formulate. I have independently reviewed the chart, obtained history, review of systems and examined the patient.I have personally reviewed pertinent head/neck/spine imaging (CT/MRI). Please feel free to call with any questions.  -- Milon Dikes, MD Neurologist [facility] Pager: [phone]

## 2023-01-28 NOTE — ED Provider Notes (Signed)
[place] EMERGENCY DEPARTMENT AT [facility] Provider Note   CSN: [id number] Arrival date & time: 01/28/23  1232     History Chief Complaint  Patient presents with   gullian-barre syndrome    HPI Dana [name] is a 57 y.o. female presenting for worsening fatigue and weakness.  57 year old female with a history of Guillain-Barr syndrome diagnosed and treated earlier this year.  Comes in for recurrent weakness fatigue malaise right leg weakness worsening perennial numbness. Seen in neurology clinic this morning sent in for IVIG treatment.  Patient's recorded medical, surgical, social, medication list and allergies were reviewed in the Snapshot window as part of the initial history.   Review of Systems   Review of Systems  Constitutional:  Positive for fatigue. Negative for chills and fever.  HENT:  Negative for ear pain and sore throat.   Eyes:  Negative for pain and visual disturbance.  Respiratory:  Negative for cough and shortness of breath.   Cardiovascular:  Negative for chest pain and palpitations.  Gastrointestinal:  Negative for abdominal pain and vomiting.  Genitourinary:  Negative for dysuria and hematuria.  Musculoskeletal:  Negative for arthralgias and back pain.  Skin:  Negative for color change and rash.  Neurological:  Positive for weakness. Negative for seizures and syncope.  All other systems reviewed and are negative.   Physical Exam Updated Vital Signs BP (!) 161/105   Pulse 85   Temp 97.7 F (36.5 C) (Oral)   Resp 13   LMP 08/17/2012   SpO2 100%  Physical Exam Vitals and nursing note reviewed.  Constitutional:      General: She is not in acute distress.    Appearance: She is well-developed.  HENT:     Head: Normocephalic and atraumatic.  Eyes:     Conjunctiva/sclera: Conjunctivae normal.  Cardiovascular:     Rate and Rhythm: Normal rate and regular rhythm.     Heart sounds: No murmur heard. Pulmonary:     Effort: Pulmonary  effort is normal. No respiratory distress.     Breath sounds: Normal breath sounds.  Abdominal:     General: There is no distension.     Palpations: Abdomen is soft.     Tenderness: There is no abdominal tenderness. There is no right CVA tenderness or left CVA tenderness.  Musculoskeletal:        General: No swelling or tenderness. Normal range of motion.     Cervical back: Neck supple.  Skin:    General: Skin is warm and dry.  Neurological:     Mental Status: She is alert.     Cranial Nerves: No cranial nerve deficit.     Comments: Cranial nerves are intact with no abnormalities 2 through 12 Upper extremities with reassuring sensory exam  Lower extremities with decreased sensation, inability to lift against gravity      ED Course/ Medical Decision Making/ A&P    Procedures Procedures   Medications Ordered in ED Medications  enoxaparin (LOVENOX) injection 40 mg (0 mg Subcutaneous Hold 01/28/23 1800)  sodium chloride flush (NS) 0.9 % injection 3 mL (has no administration in time range)  polyethylene glycol (MIRALAX / GLYCOLAX) packet 17 g (has no administration in time range)  cyclobenzaprine (FLEXERIL) tablet 10 mg (has no administration in time range)  gabapentin (NEURONTIN) capsule 900 mg (has no administration in time range)  pantoprazole (PROTONIX) EC tablet 40 mg (has no administration in time range)  ALPRAZolam (XANAX) tablet 0.5 mg (has no administration  in time range)  metoprolol tartrate (LOPRESSOR) tablet 50 mg (has no administration in time range)  furosemide (LASIX) tablet 40 mg (has no administration in time range)    Medical Decision Making:    FAIRY DAVIDS is a 57 y.o. female who presented to the ED today with chief complaint of weakness.  Detailed above.     Reviewed and confirmed nursing documentation for past medical history, family history, social history.    Initial Assessment:   This is a complex patient sent from neurology clinic for exacerbation  of neurologic symptoms in the recovery stage of Guillain-Barr.  I consulted neurology who evaluated at bedside.  They recommended admission, IR consult for access, and ultimately treatment by plasmapheresis which they stated they would be arranging. Patient is in no acute distress.  The neurology team recommended medical admission with their consultation.  Disposition:   Based on the above findings, I believe this patient is stable for admission.    Patient/family educated about specific findings on our evaluation and explained exact reasons for admission.  Patient/family educated about clinical situation and time was allowed to answer questions.   Admission team communicated with and agreed with need for admission. Patient admitted. Patient ready to move at this time.     Emergency Department Medication Summary:   Medications  enoxaparin (LOVENOX) injection 40 mg (0 mg Subcutaneous Hold 01/28/23 1800)  sodium chloride flush (NS) 0.9 % injection 3 mL (has no administration in time range)  polyethylene glycol (MIRALAX / GLYCOLAX) packet 17 g (has no administration in time range)  cyclobenzaprine (FLEXERIL) tablet 10 mg (has no administration in time range)  gabapentin (NEURONTIN) capsule 900 mg (has no administration in time range)  pantoprazole (PROTONIX) EC tablet 40 mg (has no administration in time range)  ALPRAZolam (XANAX) tablet 0.5 mg (has no administration in time range)  metoprolol tartrate (LOPRESSOR) tablet 50 mg (has no administration in time range)  furosemide (LASIX) tablet 40 mg (has no administration in time range)          Clinical Impression:  1. GBS (Guillain Barre syndrome) (HCC)      Admit   Final Clinical Impression(s) / ED Diagnoses Final diagnoses:  GBS (Guillain Barre syndrome) (HCC)    Rx / DC Orders ED Discharge Orders     None         Glyn Ade, MD 01/28/23 1456

## 2023-01-28 NOTE — ED Notes (Signed)
ED TO INPATIENT HANDOFF REPORT  ED Nurse Name and Phone #: Viet Kemmerer/ [phone]  S Name/Age/Gender Dana [name] 57 y.o. female Room/Bed: 007C/007C  Code Status   Code Status: Full Code  Home/SNF/Other Home Patient oriented to: self, place, time, and situation Is this baseline? Yes   Triage Complete: Triage complete  Chief Complaint Acute inflammatory demyelinating polyradiculoneuropathic form of Guillain-Barre syndrome (HCC) [G61.0]  Triage Note Pt BIB [facility] from [facility]. Pt was there for a follow up appt and they sent her here. Pt has Guillian-barre syndrome, was recently admitted to the hospital for the same in January.  BP 150/70 HR 70    Allergies Allergies  Allergen Reactions   Lisinopril Anaphylaxis    was hospitalized for 3 days   Codeine Hives   Cyanocobalamin [Vitamin B12] Itching    Patient able to take B12 injections with Benadryl.  Has no issues when taking B12 tablets.    Hydrocodone Hives   Penicillins Itching and Swelling    PATIENT HAS HAD A PCN REACTION WITH IMMEDIATE RASH, FACIAL/TONGUE/THROAT SWELLING, SOB, OR LIGHTHEADEDNESS WITH HYPOTENSION:  #  #  YES  #  #  Has patient had a PCN reaction causing severe rash involving mucus membranes or skin necrosis: No Has patient had a PCN reaction that required hospitalization No Has patient had a PCN reaction occurring within the last 10 years: No If all of the above answers are "NO", then may proceed with Cephalosporin use.    Amlodipine Swelling   Acetaminophen Rash    Level of Care/Admitting Diagnosis ED Disposition     ED Disposition  Admit   Condition  --   Comment  Hospital Area: [facility] [[id number]]  Level of Care: Telemetry Medical [104]  May admit patient to [facility] or [facility] if equivalent level of care is available:: No  Covid Evaluation: Asymptomatic - no recent exposure (last 10 days) testing not required  Diagnosis: Acute inflammatory  demyelinating polyradiculoneuropathic form of Guillain-Barre syndrome [facility]) [[id number]]  Admitting Physician: Synetta Fail [[id number]]  Attending Physician: Synetta Fail [phone]  Certification:: I certify this patient will need inpatient services for at least 2 midnights  Estimated Length of Stay: 2          B Medical/Surgery History Past Medical History:  Diagnosis Date   Anxiety    Back pain with radiation    Breast mass, right    Chronic pain    Chronic, continuous use of opioids    Complication of anesthesia 04/07/14   Allergic reaction to Lisinopril immediately following surgery   DJD (degenerative joint disease)    Fibromyalgia    Groin abscess    Headache(784.0)    History of IBS    Hypercholesterolemia    IBS (irritable bowel syndrome)    Lactose intolerance    Mild hypertension    Obesity    Tobacco use disorder    Umbilical hernia    Symptomatic   Past Surgical History:  Procedure Laterality Date   ABDOMINAL HYSTERECTOMY     ANTERIOR CERVICAL DECOMP/DISCECTOMY FUSION N/A 12/20/2015   Procedure: ANTERIOR CERVICAL DECOMPRESSION FUSION CERVICAL 4-5, CERVICAL 5-6, CERVICAL 6-7 WITH INSTRUMENTATION AND ALLOGRAFT;  Surgeon: Estill Bamberg, MD;  Location: [facility] OR;  Service: Orthopedics;  Laterality: N/A;  Anterior cervical decompression fusion, cervical 4-5, cervical 5-6, cervical 6-7 with instrumentation and allograft   BACK SURGERY     BILATERAL SALPINGECTOMY  09/03/2012   Procedure: BILATERAL SALPINGECTOMY;  Surgeon:  Ok Edwards, MD;  Location: [facility] ORS;  Service: Gynecology;  Laterality: Bilateral;   BREAST BIOPSY Right 04/07/2014   Procedure: REMOVAL RIGHT BREAST MASS WITH WIRE LOCALIZATION;  Surgeon: Adolph Pollack, MD;  Location: [facility] OR;  Service: General;  Laterality: Right;   BREAST EXCISIONAL BIOPSY Left    x2   BREAST LUMPECTOMY     x2   CARPAL TUNNEL RELEASE Left 05/11/2019   Procedure: LEFT CARPAL TUNNEL RELEASE, RIGHT TENNIS ELBOW  MARCAINE/DEPO MEDROL INJECTION UNDER ANESTHESIA;  Surgeon: Kerrin Champagne, MD;  Location: [facility] OR;  Service: Orthopedics;  Laterality: Left;   COLONOSCOPY W/ BIOPSIES  04/24/2012   per Dr. Leone Payor, clear, repeat in 10 yrs    disectomy     ESOPHAGOGASTRODUODENOSCOPY     FINGER SURGERY     Right index-excision of mass    FOOT SURGERY Right    Bone Spurs   LAPAROSCOPIC HYSTERECTOMY  09/03/2012   Procedure: HYSTERECTOMY TOTAL LAPAROSCOPIC;  Surgeon: Ok Edwards, MD;  Location: [facility] ORS;  Service: Gynecology;  Laterality: N/A;   LUMBAR DISC SURGERY     TUBAL LIGATION     UMBILICAL HERNIA REPAIR N/A 05/01/2018   Procedure: UMBILICAL HERNIA REPAIR;  Surgeon: Jimmye Norman, MD;  Location: [facility] OR;  Service: General;  Laterality: N/A;     A IV Location/Drains/Wounds Patient Lines/Drains/Airways Status     Active Line/Drains/Airways     Name Placement date Placement time Site Days   Peripheral IV 01/28/23 20 G Right Antecubital 01/28/23  1315  Antecubital  less than 1            Intake/Output Last 24 hours No intake or output data in the 24 hours ending 01/28/23 1706  Labs/Imaging Results for orders placed or performed during the hospital encounter of 01/28/23 (from the past 48 hour(s))  CBC with Differential     Status: None   Collection Time: 01/28/23  1:20 PM  Result Value Ref Range   WBC 9.1 4.0 - 10.5 K/uL   RBC 4.90 3.87 - 5.11 MIL/uL   Hemoglobin 13.9 12.0 - 15.0 g/dL   HCT 16.1 09.6 - 04.5 %   MCV 89.4 80.0 - 100.0 fL   MCH 28.4 26.0 - 34.0 pg   MCHC 31.7 30.0 - 36.0 g/dL   RDW 40.9 81.1 - 91.4 %   Platelets 289 150 - 400 K/uL   nRBC 0.0 0.0 - 0.2 %   Neutrophils Relative % 70 %   Neutro Abs 6.3 1.7 - 7.7 K/uL   Lymphocytes Relative 21 %   Lymphs Abs 1.9 0.7 - 4.0 K/uL   Monocytes Relative 8 %   Monocytes Absolute 0.7 0.1 - 1.0 K/uL   Eosinophils Relative 1 %   Eosinophils Absolute 0.1 0.0 - 0.5 K/uL   Basophils Relative 0 %   Basophils Absolute 0.0 0.0 - 0.1  K/uL   Immature Granulocytes 0 %   Abs Immature Granulocytes 0.03 0.00 - 0.07 K/uL    Comment: Performed at [facility], [address], [place], [place] [address]  Basic metabolic panel     Status: Abnormal   Collection Time: 01/28/23  1:20 PM  Result Value Ref Range   Sodium 140 135 - 145 mmol/L   Potassium 4.2 3.5 - 5.1 mmol/L   Chloride 102 98 - 111 mmol/L   CO2 24 22 - 32 mmol/L   Glucose, Bld 106 (H) 70 - 99 mg/dL    Comment: Glucose reference range  applies only to samples taken after fasting for at least 8 hours.   BUN 11 6 - 20 mg/dL   Creatinine, Ser 1.61 (H) 0.44 - 1.00 mg/dL   Calcium 9.7 8.9 - 09.6 mg/dL   GFR, Estimated >04 >54 mL/min    Comment: (NOTE) Calculated using the CKD-EPI Creatinine Equation (2021)    Anion gap 14 5 - 15    Comment: Performed at [facility], [address], [place], [place] [address]   NCV with EMG(electromyography)  Result Date: 01/28/2023 Levert Feinstein, MD     01/28/2023  1:03 PM     Full Name: Mykayla Henshall Gender: Female MRN #: [id number] Date of Birth: 1966/01/01   Visit Date: 01/28/2023 09:17 Age: 3 Years Examining Physician: Dr. Levert Feinstein Referring Physician: Dr. Levert Feinstein Height: 5 feet 4 inch History: 57 year old female, presenting with ascending paresthesia, weakness, gait abnormality Summary of the tests: Nerve conduction study: Right sural, superficial peroneal, bilateral median, ulnar and radial sensory responses were absent. Left tibial motor responses were absent.  Left peroneal to EDB motor response showed normal CMAP amplitude at distal stimulation site, but was not able to elicit any response at proximal stimulation site. Right tibial motor responses showed significantly decreased CMAP amplitude, was not able to elicit any response at proximal stimulation side. Bilateral ulnar, right median motor responses showed moderately prolonged distal latency, F-wave latency. Right ulnar F-wave latency was absent.  Bilateral median,  left ulnar F-wave latency was prolonged.  Right tibial F-wave latency was absent. Electromyography: Selected needle examinations of right upper, lower extremity muscles, right cervical and lumbosacral paraspinal muscles were performed. There was no evidence of axonal loss, no spontaneous activity, normal morphology motor unit potential was mildly decreased activation. There was polyphasic motor unit potential at right lumbosacral paraspinal muscles.  There was no spontaneous activity at the right cervical paraspinal muscles.   Conclusion: This is an abnormal study.  There is electrodiagnostic evidence of demyelinating polyradiculoneuropathy, there is no evidence of axonal loss. ------------------------------- Levert Feinstein, M.D. Ph.D. [facility] [address] [place], [place] [address] Tel: [phone] Fax: [phone] Verbal informed consent was obtained from the patient, patient was informed of potential risk of procedure, including bruising, bleeding, hematoma formation, infection, muscle weakness, muscle pain, numbness, among others.     MNC   Nerve / Sites Muscle Latency Ref. Amplitude Ref. Rel Amp Segments Distance Velocity Ref. Area   ms ms mV mV %  cm m/s m/s mVms R Median - APB    Wrist APB 5.6 ?4.4 6.2 ?4.0 100 Wrist - APB 7   27.5    Upper arm APB 9.9  5.6  89.6 Upper arm - Wrist 25 59 ?49 22.3 L Median - APB    Wrist APB 5.0 ?4.4 0.4 ?4.0 100 Wrist - APB 7   1.9    Upper arm APB 12.6  0.6  162 Upper arm - Wrist 25 33 ?49 1.3 R Ulnar - ADM    Wrist ADM 4.1 ?3.3 8.2 ?6.0 100 Wrist - ADM 7   23.9    B.Elbow ADM 6.4  8.7  105 B.Elbow - Wrist 10 44 ?49 27.5    A.Elbow ADM 10.4  6.7  77.8 A.Elbow - B.Elbow 19 47 ?49 23.1 L Ulnar - ADM    Wrist ADM 3.9 ?3.3 8.9 ?6.0 100 Wrist - ADM 7   35.2    B.Elbow ADM 6.1  7.4  83.4 B.Elbow - Wrist 10 46 ?  49 29.6    A.Elbow ADM 10.6  7.8  106 A.Elbow - B.Elbow 20 44 ?49 30.0 R Peroneal - EDB    Ankle EDB 6.2 ?6.5 1.4 ?2.0 100 Ankle - EDB 9   4.5     Fib head EDB 14.6  0.2  17.3 Fib head - Ankle 24 29 ?44 0.5    Pop fossa EDB 20.3  0.0  7.5 Pop fossa - Fib head 13 23 ?44 0.0        Pop fossa - Ankle     L Peroneal - EDB    Ankle EDB 6.1 ?6.5 2.5 ?2.0 100 Ankle - EDB 9   7.9    Fib head EDB      Fib head - Ankle   ?44     Pop fossa EDB      Pop fossa - Fib head   ?44         Pop fossa - Ankle     R Tibial - AH    Ankle AH 7.5 ?5.8 1.4 ?4.0 100 Ankle - AH 9   3.3    Pop fossa AH      Pop fossa - Ankle   ?41  L Tibial - AH    Ankle AH NR ?5.8 NR ?4.0 NR Ankle - AH 9   NR                   SNC   Nerve / Sites Rec. Site Peak Lat Ref.  Amp Ref. Segments Distance   ms ms V V  cm R Radial - Anatomical snuff box (Forearm)    Forearm Wrist NR ?2.9 NR ?15 Forearm - Wrist 10 L Radial - Anatomical snuff box (Forearm)    Forearm Wrist NR ?2.9 NR ?15 Forearm - Wrist 10 R Superficial peroneal - Ankle    Lat leg Ankle NR ?4.4 NR ?6 Lat leg - Ankle 14 L Superficial peroneal - Ankle    Lat leg Ankle NR ?4.4 NR ?6 Lat leg - Ankle 14 R Median - Orthodromic (Dig II, Mid palm)    Dig II Wrist NR ?3.4 NR ?10 Dig II - Wrist 13 L Median - Orthodromic (Dig II, Mid palm)    Dig II Wrist NR ?3.4 NR ?10 Dig II - Wrist 13 R Ulnar - Orthodromic, (Dig V, Mid palm)    Dig V Wrist NR ?3.1 NR ?5 Dig V - Wrist 11 L Ulnar - Orthodromic, (Dig V, Mid palm)    Dig V Wrist NR ?3.1 NR ?5 Dig V - Wrist 40                     F  Wave   Nerve F Lat Ref.  ms ms R Median - APB 33.1 ?31.0 R Ulnar - ADM NR ?32.0 R Peroneal - EDB  NR ?56.0 R Tibial - AH NR ?56.0 L Median - APB 35.5 ?31.0 L Ulnar - ADM 33.8 ?32.0 L Peroneal - EDB NR ?56.0                 EMG Summary Table   Spontaneous MUAP Recruitment Muscle IA Fib PSW Fasc Other Amp Dur. Poly Pattern R. Tibialis anterior Normal None None None _______ Normal Normal Normal Normal R. Tibialis posterior Normal None None None _______ Normal Normal Normal Normal R. Peroneus longus Normal None None None _______ Normal Normal Normal Normal R. Gastrocnemius (Medial  head) Normal None None None _______  Normal Normal Normal Normal R. Vastus lateralis Normal None None None _______ Normal Normal Normal Normal R. Lumbar paraspinals (low) Normal None None None _______ Increased Increased 1+ Reduced R. Lumbar paraspinals (mid) Normal None None None _______ Increased Increased 1+ Reduced R. First dorsal interosseous Normal None None None _______ Normal Normal Normal Normal R. Pronator teres Normal None None None _______ Normal Normal Normal Normal R. Biceps brachii Normal None None None _______ Normal Normal Normal Normal R. Deltoid Normal None None None _______ Normal Normal Normal Normal R. Triceps brachii Normal None None None _______ Normal Normal Normal Normal R. Cervical paraspinals Normal None None None _______ Normal Normal Normal Normal     Pending Labs Unresulted Labs (From admission, onward)     Start     Ordered   02/04/23 0500  Creatinine, serum  (enoxaparin (LOVENOX)    CrCl >/= 30 ml/min)  Weekly,   R     Comments: while on enoxaparin therapy    01/28/23 1446   01/29/23 0500  Comprehensive metabolic panel  Tomorrow morning,   R        01/28/23 1446   01/29/23 0500  CBC  Tomorrow morning,   R        01/28/23 1446            Vitals/Pain Today's Vitals   01/28/23 1235 01/28/23 1330 01/28/23 1405 01/28/23 1629  BP: (!) 173/79  (!) 161/105   Pulse: 82 81 85   Resp: 18 17 13    Temp: 97.7 F (36.5 C)   98.1 F (36.7 C)  TempSrc: Oral   Oral  SpO2: 98% 100% 100%     Isolation Precautions No active isolations  Medications Medications  sodium chloride flush (NS) 0.9 % injection 3 mL (3 mLs Intravenous Not Given 01/28/23 1627)  polyethylene glycol (MIRALAX / GLYCOLAX) packet 17 g (has no administration in time range)  cyclobenzaprine (FLEXERIL) tablet 10 mg (has no administration in time range)  gabapentin (NEURONTIN) capsule 900 mg (900 mg Oral Given 01/28/23 1626)  pantoprazole (PROTONIX) EC tablet 40 mg (has no administration in time  range)  ALPRAZolam (XANAX) tablet 0.5 mg (has no administration in time range)  metoprolol tartrate (LOPRESSOR) tablet 50 mg (has no administration in time range)  furosemide (LASIX) tablet 40 mg (has no administration in time range)  enoxaparin (LOVENOX) injection 60 mg (has no administration in time range)  dorzolamide-timolol (COSOPT) 2-0.5 % ophthalmic solution 1 drop (has no administration in time range)  HYDROmorphone (DILAUDID) tablet 2 mg (has no administration in time range)  latanoprost (XALATAN) 0.005 % ophthalmic solution 1 drop (has no administration in time range)  traZODone (DESYREL) tablet 50-100 mg (has no administration in time range)    Mobility walks with person assist     Focused Assessments     R Recommendations: See Admitting Provider Note  Report given to:   Additional Notes: Pt came in from a neuro follow up. Pt has Guillian- barre syndrome. She has weakness and swelling in the lower extremities. Pt is A&Ox4, and ambulatory with assistance.

## 2023-01-28 NOTE — Progress Notes (Signed)
Patient to 2W15 at this time 

## 2023-01-28 NOTE — Procedures (Signed)
Full Name: Dana [name] Gender: Female MRN #: [id number] Date of Birth: 01/02/1966    Visit Date: 01/28/2023 09:17 Age: 57 Years Examining Physician: Dr. Levert Feinstein Referring Physician: Dr. Levert Feinstein Height: 5 feet 4 inch History: 57 year old female, presenting with ascending paresthesia, weakness, gait abnormality  Summary of the tests:  Nerve conduction study: Right sural, superficial peroneal, bilateral median, ulnar and radial sensory responses were absent.  Left tibial motor responses were absent.  Left peroneal to EDB motor response showed normal CMAP amplitude at distal stimulation site, but was not able to elicit any response at proximal stimulation site.  Right tibial motor responses showed significantly decreased CMAP amplitude, was not able to elicit any response at proximal stimulation side.  Bilateral ulnar, right median motor responses showed moderately prolonged distal latency, F-wave latency. Right ulnar F-wave latency was absent.  Bilateral median, left ulnar F-wave latency was prolonged.  Right tibial F-wave latency was absent.  Electromyography: Selected needle examinations of right upper, lower extremity muscles, right cervical and lumbosacral paraspinal muscles were performed.  There was no evidence of axonal loss, no spontaneous activity, normal morphology motor unit potential was mildly decreased activation.  There was polyphasic motor unit potential at right lumbosacral paraspinal muscles.  There was no spontaneous activity at the right cervical paraspinal muscles.    Conclusion: This is an abnormal study.  There is electrodiagnostic evidence of demyelinating polyradiculoneuropathy, there is no evidence of axonal loss.    ------------------------------- Levert Feinstein, M.D. Ph.D.  [facility] [address] [place], [place] [address] Tel: [phone] Fax: [phone]  Verbal informed consent was obtained from the  patient, patient was informed of potential risk of procedure, including bruising, bleeding, hematoma formation, infection, muscle weakness, muscle pain, numbness, among others.        MNC    Nerve / Sites Muscle Latency Ref. Amplitude Ref. Rel Amp Segments Distance Velocity Ref. Area    ms ms mV mV %  cm m/s m/s mVms  R Median - APB     Wrist APB 5.6 ?4.4 6.2 ?4.0 100 Wrist - APB 7   27.5     Upper arm APB 9.9  5.6  89.6 Upper arm - Wrist 25 59 ?49 22.3  L Median - APB     Wrist APB 5.0 ?4.4 0.4 ?4.0 100 Wrist - APB 7   1.9     Upper arm APB 12.6  0.6  162 Upper arm - Wrist 25 33 ?49 1.3  R Ulnar - ADM     Wrist ADM 4.1 ?3.3 8.2 ?6.0 100 Wrist - ADM 7   23.9     B.Elbow ADM 6.4  8.7  105 B.Elbow - Wrist 10 44 ?49 27.5     A.Elbow ADM 10.4  6.7  77.8 A.Elbow - B.Elbow 19 47 ?49 23.1  L Ulnar - ADM     Wrist ADM 3.9 ?3.3 8.9 ?6.0 100 Wrist - ADM 7   35.2     B.Elbow ADM 6.1  7.4  83.4 B.Elbow - Wrist 10 46 ?49 29.6     A.Elbow ADM 10.6  7.8  106 A.Elbow - B.Elbow 20 44 ?49 30.0  R Peroneal - EDB     Ankle EDB 6.2 ?6.5 1.4 ?2.0 100 Ankle - EDB 9   4.5     Fib head EDB 14.6  0.2  17.3 Fib head - Ankle 24 29 ?44 0.5     Pop  fossa EDB 20.3  0.0  7.5 Pop fossa - Fib head 13 23 ?44 0.0         Pop fossa - Ankle      L Peroneal - EDB     Ankle EDB 6.1 ?6.5 2.5 ?2.0 100 Ankle - EDB 9   7.9     Fib head EDB      Fib head - Ankle   ?44      Pop fossa EDB      Pop fossa - Fib head   ?44          Pop fossa - Ankle      R Tibial - AH     Ankle AH 7.5 ?5.8 1.4 ?4.0 100 Ankle - AH 9   3.3     Pop fossa AH      Pop fossa - Ankle   ?41   L Tibial - AH     Ankle AH NR ?5.8 NR ?4.0 NR Ankle - AH 9   NR                     SNC    Nerve / Sites Rec. Site Peak Lat Ref.  Amp Ref. Segments Distance    ms ms V V  cm  R Radial - Anatomical snuff box (Forearm)     Forearm Wrist NR ?2.9 NR ?15 Forearm - Wrist 10  L Radial - Anatomical snuff box (Forearm)     Forearm Wrist NR ?2.9 NR ?15 Forearm -  Wrist 10  R Superficial peroneal - Ankle     Lat leg Ankle NR ?4.4 NR ?6 Lat leg - Ankle 14  L Superficial peroneal - Ankle     Lat leg Ankle NR ?4.4 NR ?6 Lat leg - Ankle 14  R Median - Orthodromic (Dig II, Mid palm)     Dig II Wrist NR ?3.4 NR ?10 Dig II - Wrist 13  L Median - Orthodromic (Dig II, Mid palm)     Dig II Wrist NR ?3.4 NR ?10 Dig II - Wrist 13  R Ulnar - Orthodromic, (Dig V, Mid palm)     Dig V Wrist NR ?3.1 NR ?5 Dig V - Wrist 11  L Ulnar - Orthodromic, (Dig V, Mid palm)     Dig V Wrist NR ?3.1 NR ?5 Dig V - Wrist 8                       F  Wave    Nerve F Lat Ref.   ms ms  R Median - APB 33.1 ?31.0  R Ulnar - ADM NR ?32.0  R Peroneal - EDB  NR ?56.0  R Tibial - AH NR ?56.0  L Median - APB 35.5 ?31.0  L Ulnar - ADM 33.8 ?32.0  L Peroneal - EDB NR ?56.0                   EMG Summary Table    Spontaneous MUAP Recruitment  Muscle IA Fib PSW Fasc Other Amp Dur. Poly Pattern  R. Tibialis anterior Normal None None None _______ Normal Normal Normal Normal  R. Tibialis posterior Normal None None None _______ Normal Normal Normal Normal  R. Peroneus longus Normal None None None _______ Normal Normal Normal Normal  R. Gastrocnemius (Medial head) Normal None None None _______ Normal Normal Normal Normal  R. Vastus lateralis Normal None None None _______ Normal  Normal Normal Normal  R. Lumbar paraspinals (low) Normal None None None _______ Increased Increased 1+ Reduced  R. Lumbar paraspinals (mid) Normal None None None _______ Increased Increased 1+ Reduced  R. First dorsal interosseous Normal None None None _______ Normal Normal Normal Normal  R. Pronator teres Normal None None None _______ Normal Normal Normal Normal  R. Biceps brachii Normal None None None _______ Normal Normal Normal Normal  R. Deltoid Normal None None None _______ Normal Normal Normal Normal  R. Triceps brachii Normal None None None _______ Normal Normal Normal Normal  R. Cervical paraspinals  Normal None None None _______ Normal Normal Normal Normal

## 2023-01-28 NOTE — Telephone Encounter (Signed)
Done

## 2023-01-28 NOTE — ED Triage Notes (Signed)
Pt BIB [facility] from [facility]. Pt was there for a follow up appt and they sent her here. Pt has Guillian-barre syndrome, was recently admitted to the hospital for the same in January.  BP 150/70 HR 70

## 2023-01-29 ENCOUNTER — Inpatient Hospital Stay (HOSPITAL_COMMUNITY): Payer: 59

## 2023-01-29 DIAGNOSIS — I10 Essential (primary) hypertension: Secondary | ICD-10-CM | POA: Diagnosis not present

## 2023-01-29 DIAGNOSIS — M48062 Spinal stenosis, lumbar region with neurogenic claudication: Secondary | ICD-10-CM | POA: Diagnosis not present

## 2023-01-29 DIAGNOSIS — G6181 Chronic inflammatory demyelinating polyneuritis: Secondary | ICD-10-CM

## 2023-01-29 HISTORY — PX: IR FLUORO GUIDE CV LINE RIGHT: IMG2283

## 2023-01-29 LAB — IRON AND TIBC
Iron: 47 ug/dL (ref 28–170)
Saturation Ratios: 13 % (ref 10.4–31.8)
TIBC: 365 ug/dL (ref 250–450)
UIBC: 318 ug/dL

## 2023-01-29 LAB — COMPREHENSIVE METABOLIC PANEL
ALT: 13 U/L (ref 0–44)
AST: 18 U/L (ref 15–41)
Albumin: 3.2 g/dL — ABNORMAL LOW (ref 3.5–5.0)
Alkaline Phosphatase: 80 U/L (ref 38–126)
Anion gap: 11 (ref 5–15)
BUN: 9 mg/dL (ref 6–20)
CO2: 24 mmol/L (ref 22–32)
Calcium: 8.7 mg/dL — ABNORMAL LOW (ref 8.9–10.3)
Chloride: 104 mmol/L (ref 98–111)
Creatinine, Ser: 1.07 mg/dL — ABNORMAL HIGH (ref 0.44–1.00)
GFR, Estimated: >60 mL/min (ref >60)
Glucose, Bld: 133 mg/dL — ABNORMAL HIGH (ref 70–99)
Potassium: 3.8 mmol/L (ref 3.5–5.1)
Sodium: 139 mmol/L (ref 135–145)
Total Bilirubin: 0.5 mg/dL (ref 0.3–1.2)
Total Protein: 6.1 g/dL — ABNORMAL LOW (ref 6.5–8.1)

## 2023-01-29 LAB — CBC
HCT: 40.5 % (ref 36.0–46.0)
Hemoglobin: 12.6 g/dL (ref 12.0–15.0)
MCH: 27.9 pg (ref 26.0–34.0)
MCHC: 31.1 g/dL (ref 30.0–36.0)
MCV: 89.8 fL (ref 80.0–100.0)
Platelets: 256 10*3/uL (ref 150–400)
RBC: 4.51 MIL/uL (ref 3.87–5.11)
RDW: 14.4 % (ref 11.5–15.5)
WBC: 7 10*3/uL (ref 4.0–10.5)
nRBC: 0 % (ref 0.0–0.2)

## 2023-01-29 LAB — FERRITIN: Ferritin: 184 ng/mL (ref 11–307)

## 2023-01-29 MED ORDER — ENOXAPARIN SODIUM 60 MG/0.6ML IJ SOSY
60.0000 mg | PREFILLED_SYRINGE | INTRAMUSCULAR | Status: DC
  Administered 2023-01-30 – 2023-02-07 (×9): 60 mg via SUBCUTANEOUS
  Filled 2023-01-29 (×10): qty 0.6

## 2023-01-29 MED ORDER — MIDAZOLAM HCL 2 MG/2ML IJ SOLN
INTRAMUSCULAR | Status: AC
  Filled 2023-01-29: qty 2

## 2023-01-29 MED ORDER — HYDRALAZINE HCL 25 MG PO TABS: 25.0000 mg | ORAL_TABLET | Freq: Three times a day (TID) | ORAL | Status: DC | PRN

## 2023-01-29 MED ORDER — LIDOCAINE HCL 1 % IJ SOLN
INTRAMUSCULAR | Status: AC
  Filled 2023-01-29: qty 20

## 2023-01-29 MED ORDER — MIDAZOLAM HCL 2 MG/2ML IJ SOLN
INTRAMUSCULAR | Status: AC | PRN
  Administered 2023-01-29: .5 mg via INTRAVENOUS
  Administered 2023-01-29: 1 mg via INTRAVENOUS

## 2023-01-29 MED ORDER — HEPARIN SODIUM (PORCINE) 1000 UNIT/ML IJ SOLN
INTRAMUSCULAR | Status: AC
  Filled 2023-01-29: qty 10

## 2023-01-29 MED ORDER — LIDOCAINE-EPINEPHRINE 1 %-1:100000 IJ SOLN
INTRAMUSCULAR | Status: AC
  Filled 2023-01-29: qty 1

## 2023-01-29 MED ORDER — FENTANYL CITRATE (PF) 100 MCG/2ML IJ SOLN
INTRAMUSCULAR | Status: AC
  Filled 2023-01-29: qty 2

## 2023-01-29 MED ORDER — GELATIN ABSORBABLE 12-7 MM EX MISC
CUTANEOUS | Status: AC
  Filled 2023-01-29: qty 1

## 2023-01-29 MED ORDER — FENTANYL CITRATE (PF) 100 MCG/2ML IJ SOLN
INTRAMUSCULAR | Status: AC | PRN
  Administered 2023-01-29 (×2): 25 ug via INTRAVENOUS

## 2023-01-29 MED ORDER — CEFAZOLIN SODIUM-DEXTROSE 2-4 GM/100ML-% IV SOLN
2.0000 g | Freq: Once | INTRAVENOUS | Status: AC
  Administered 2023-01-29: 2 g via INTRAVENOUS

## 2023-01-29 MED ORDER — CEFAZOLIN SODIUM-DEXTROSE 2-4 GM/100ML-% IV SOLN
INTRAVENOUS | Status: AC
  Filled 2023-01-29: qty 100

## 2023-01-29 MED ORDER — CHLORHEXIDINE GLUCONATE CLOTH 2 % EX PADS
6.0000 | MEDICATED_PAD | Freq: Every day | CUTANEOUS | Status: DC
  Administered 2023-01-29 – 2023-02-08 (×11): 6 via TOPICAL

## 2023-01-29 NOTE — Procedures (Signed)
Pre-procedure Diagnosis: Guillain-Barre syndrome  Post-procedure Diagnosis: Same  Successful placement of tunneled plasmapheresis catheter with tips terminating within the superior aspect of the right atrium.    Complications: None Immediate EBL: Trace   The catheter is ready for immediate use.   Katherina Right, MD Pager #: [phone]

## 2023-01-29 NOTE — Assessment & Plan Note (Signed)
-   Continue home metoprolol/Lasix

## 2023-01-29 NOTE — Evaluation (Signed)
Occupational Therapy Evaluation Patient Details Name: Dana [name] MRN: [id number] DOB: 10-Sep-1966 Today's Date: 01/29/2023   History of Present Illness 57 yo admitted with CIDP (chronic inflammatory demyelinating polyneuropathy). Saw Neuro on day of admission, NCS showed demyelination, referred to hospital for PLEX. Initially diagnosed with an acute demyelinating neuropathy Jan 2024, treated with IVIG. PMH: Spinal stenosis, HTN, Obesity   Clinical Impression   PTA pt lives at home with her husband/sister-in-law stays with her while her husband works. At baseline, Camryn is able to stand and complete a squat pivot transfer. She often uses a Stedy to transfer a distance if she is not using her electric w/c. She is able to complete her bathing/dressing with set up @ bed level.  Acute OT to follow to facilitate a safe DC home. Recommend follow up with OT at the outpt center @ [place] location. Discussed with CM.     Recommendations for follow up therapy are one component of a multi-disciplinary discharge planning process, led by the attending physician.  Recommendations may be updated based on patient status, additional functional criteria and insurance authorization.   Assistance Recommended at Discharge Frequent or constant Supervision/Assistance  Patient can return home with the following A little help with walking and/or transfers;A little help with bathing/dressing/bathroom;Assistance with cooking/housework;Assist for transportation    Functional Status Assessment  Patient has had a recent decline in their functional status and demonstrates the ability to make significant improvements in function in a reasonable and predictable amount of time.  Equipment Recommendations  None recommended by OT    Recommendations for Other Services       Precautions / Restrictions Precautions Precautions: Fall      Mobility Bed Mobility Overal bed mobility: Needs Assistance Bed Mobility:  Supine to Sit     Supine to sit: Min assist     General bed mobility comments: assistance dueto R upper quadrant being sore form line placement this am    Transfers Overall transfer level: Needs assistance Equipment used: 1 person hand held assist Transfers: Sit to/from Stand Sit to Stand: Min assist           General transfer comment: unable to step and pivot; uses a Stedy at home      Balance Overall balance assessment: Needs assistance   Sitting balance-Leahy Scale: Fair                                     ADL either performed or assessed with clinical judgement   ADL Overall ADL's : Needs assistance/impaired                                     Functional mobility during ADLs: Minimal assistance General ADL Comments: overall set up for UB ADL adn min A with LB form sit -stand; can complete @ bed level withset up; issued female urinal, which is what she uses at home     Vision Baseline Vision/History: 1 Wears glasses;3 Glaucoma (per pt report)       Perception     Praxis      Pertinent Vitals/Pain Pain Assessment Pain Assessment: 0-10 Pain Score: 6  Pain Location: all over; R shoulder s/p placemetn of line Pain Descriptors / Indicators: Discomfort Pain Intervention(s): Limited activity within patient's tolerance     Hand Dominance Right  Extremity/Trunk Assessment Upper Extremity Assessment Upper Extremity Assessment: Generalized weakness;RUE deficits/detail;LUE deficits/detail LUE Deficits / Details: overall weaker than RUE but functional   Lower Extremity Assessment Lower Extremity Assessment: Defer to PT evaluation   Cervical / Trunk Assessment Cervical / Trunk Assessment: Other exceptions (increased body habitus)   Communication     Cognition Arousal/Alertness: Awake/alert Behavior During Therapy: WFL for tasks assessed/performed Overall Cognitive Status: Within Functional Limits for tasks assessed                                        General Comments       Exercises Exercises: Other exercises Other Exercises Other Exercises: issued foam block to work on grip/pinch strength   Shoulder Instructions      Home Living Family/patient expects to be discharged to:: Private residence Living Arrangements: Spouse/significant other Available Help at Discharge: Family;Available 24 hours/day Type of Home: House Home Access: Ramped entrance     Home Layout: One level     Bathroom Shower/Tub: Tub/shower unit;Walk-in shower   Bathroom Toilet: Standard Bathroom Accessibility: Yes How Accessible: Accessible via walker Home Equipment: Wheelchair - power;Other (comment);BSC/3in1;Tub bench;Hospital bed Antony Salmon)          Prior Functioning/Environment Prior Level of Function : Needs assist       Physical Assist : Mobility (physical);ADLs (physical) Mobility (physical): Transfers ADLs (physical):  (set up only) Mobility Comments: Using the Stedy to go distances; can do a stand pivot; not walking; using electric wc          OT Problem List: Decreased strength;Impaired balance (sitting and/or standing);Obesity;Pain      OT Treatment/Interventions: Self-care/ADL training;Therapeutic exercise;Neuromuscular education;DME and/or AE instruction;Therapeutic activities;Patient/family education;Balance training    OT Goals(Current goals can be found in the care plan section) Acute Rehab OT Goals Patient Stated Goal: to get treated and go back home OT Goal Formulation: With patient Time For Goal Achievement: 02/12/23 Potential to Achieve Goals: Good  OT Frequency: Min 2X/week    Co-evaluation              AM-PAC OT "6 Clicks" Daily Activity     Outcome Measure Help from another person eating meals?: None Help from another person taking care of personal grooming?: A Little Help from another person toileting, which includes using toliet, bedpan, or urinal?: A  Little Help from another person bathing (including washing, rinsing, drying)?: A Little Help from another person to put on and taking off regular upper body clothing?: A Little Help from another person to put on and taking off regular lower body clothing?: A Little 6 Click Score: 19   End of Session Nurse Communication: Mobility status;Need for lift equipment  Activity Tolerance: Patient tolerated treatment well Patient left: in bed;with call bell/phone within reach  OT Visit Diagnosis: Other abnormalities of gait and mobility (R26.89);Muscle weakness (generalized) (M62.81);Pain Pain - part of body:  (all over)                Time: 8119-1478 OT Time Calculation (min): 30 min Charges:  OT General Charges $OT Visit: 1 Visit OT Evaluation $OT Eval Moderate Complexity: 1 Mod OT Treatments $Self Care/Home Management : 8-22 mins  Luisa Dago, OT/L   Acute OT Clinical Specialist Acute Rehabilitation Services Pager [phone] Office [phone]   [facility] 01/29/2023, 4:23 PM

## 2023-01-29 NOTE — TOC Initial Note (Addendum)
Transition of Care [facility]) - Initial/Assessment Note    Patient Details  Name: Dana [name] MRN: [id number] Date of Birth: Feb 07, 1966  [facility] CM/SW Contact:    Janae Bridgeman, RN Phone Number: 01/29/2023, 2:51 PM  Clinical Narrative:                 CM met with the patient at the bedside to discuss TOC needs.  The patient was admitted from neurology office for persistent demyelinating polyradiculoneuropathy and recent Karlene Lineman.    The patient has CVL placement today in Right chest and is waiting IV treatments for plasmapheresis.    The patient has DME at the home - including hospital bed, shower chair, 3:1 and electric wheelchair.  The patient is pending PT/OT eval.  The patient was recently discharged from home health services with [facility] and the patient states that she would rather continue services for Outpatient services at [facility] rehabilitation [facility].  Home health was declined at this time.  The patient uses transportation to appointments in the community through [employer] with her electric wheelchair.  CM will continue to follow the patient for TOC needs - pending PT/OT eval.  01/29/2023 1604 - CM spoke with PT and the patient is appropriate for Outpatient PT/OT services.  Patient was currently receiving services at the [facility] Outpatient therapy clinic at [facility] and referral was placed and notation placed in the AVS for patient to follow up and continue services at the center when she is discharged.  Expected Discharge Plan: OP Rehab Barriers to Discharge: Continued Medical Work up   Patient Goals and CMS Choice Patient states their goals for this hospitalization and ongoing recovery are:: To return home CMS Medicare.gov Compare Post Acute Care list provided to:: Patient Choice offered to / list presented to : Patient [place] ownership interest in [facility].provided to:: Patient    Expected Discharge  Plan and Services   Discharge Planning Services: CM Consult Post Acute Care Choice: Resumption of Svcs/PTA Provider Living arrangements for the past 2 months: Single Family Home                                      Prior Living Arrangements/Services Living arrangements for the past 2 months: Single Family Home Lives with:: Relatives, Spouse (LIves with spouse 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-law at the home)   Do you feel safe going back to the place where you live?: Yes      Need for Family Participation in Patient Care: Yes (Comment) Care giver support system in place?: Yes (comment) Current home services: DME (DME at the home includes hospital bed, shower chair, 3:1, electric wheelchair) Criminal Activity/Legal Involvement Pertinent to Current Situation/Hospitalization: No - Comment as needed  Activities of Daily Living Home Assistive Devices/Equipment: Bedside commode/3-in-1, Cane (specify quad or straight), Eyeglasses, Shower chair with back, Wheelchair, Environmental consultant (specify type) ADL Screening (condition at time of admission) Patient's cognitive ability adequate to safely complete daily activities?: Yes Is the patient deaf or have difficulty hearing?: No Does the patient have difficulty seeing, even when wearing glasses/contacts?: No Does the patient have difficulty concentrating, remembering, or making decisions?: No Patient able to express need for assistance with ADLs?: Yes Does the patient have difficulty dressing or bathing?: Yes Independently performs ADLs?: No Communication: Independent Dressing (OT): Needs assistance Is this a change from baseline?: Pre-admission baseline Grooming: Needs assistance Is  this a change from baseline?: Pre-admission baseline Feeding: Independent Bathing: Needs assistance Is this a change from baseline?: Pre-admission baseline Toileting: Needs assistance Is this a change from baseline?: Pre-admission baseline In/Out Bed: Needs assistance Is this a  change from baseline?: Pre-admission baseline Walks in Home: Needs assistance Is this a change from baseline?: Pre-admission baseline Does the patient have difficulty walking or climbing stairs?: Yes Weakness of Legs: Both Weakness of Arms/Hands: None  Permission Sought/Granted Permission sought to share information with : Case Manager, Magazine features editor, Family Supports Permission granted to share information with : Yes, Verbal Permission Granted              Emotional Assessment Appearance:: Appears stated age Attitude/Demeanor/Rapport: Gracious Affect (typically observed): Accepting Orientation: : Oriented to Self, Oriented to Place, Oriented to  Time, Oriented to Situation, Fluctuating Orientation (Suspected and/or reported Sundowners) Alcohol / Substance Use: Not Applicable Psych Involvement: No (comment)  Admission diagnosis:  GBS (Guillain Barre syndrome) (HCC) [G61.0] Acute inflammatory demyelinating polyradiculoneuropathic form of Guillain-Barre syndrome (HCC) [G61.0] Patient Active Problem List   Diagnosis Date Noted   CIDP (chronic inflammatory demyelinating polyneuropathy) (HCC) 01/28/2023   Gait abnormality 01/28/2023   Glaucoma 01/28/2023   Pain due to onychomycosis of toenails of both feet 01/21/2023   Wheelchair dependence 12/30/2022   Nerve pain 12/30/2022   Insomnia due to medical condition 12/30/2022   Acute inflammatory demyelinating polyneuropathy (HCC) 10/01/2022   UTI (urinary tract infection) 09/24/2022   Hypomagnesemia 09/24/2022   Weakness 09/23/2022   Hypokalemia 09/23/2022   B12 deficiency 09/05/2022   Folate deficiency 09/05/2022   Carpal tunnel syndrome, left upper limb 05/11/2019    Class: Chronic   Spinal stenosis of lumbar region with neurogenic claudication 08/20/2018   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   Primary osteoarthritis of right knee 02/27/2017   Right tennis elbow 11/11/2016   Muscle cramps 01/15/2016    Bilateral leg edema 01/08/2016   Radiculopathy 12/20/2015   Hyperglycemia 12/21/2014   Mastodynia, female 11/30/2014   S/P excision of fibroadenoma of breast 11/30/2014   Angioedema of lips 04/07/2014   Fibroadenoma of right breast 03/07/2014   Pelvic pain in female 06/19/2012   Menorrhagia 06/11/2012   SUI (stress urinary incontinence, female) 06/11/2012   HTN (hypertension) 06/11/2012   IBS (irritable bowel syndrome) - diarrhea predominant 03/17/2012   MICROSCOPIC HEMATURIA 11/30/2010   BREAST PAIN, RIGHT 11/30/2010   BREAST MASS, RIGHT 01/12/2010   HYPERCHOLESTEROLEMIA 12/07/2007   CIGARETTE SMOKER 12/07/2007   DEGENERATIVE JOINT DISEASE 12/07/2007   Low back pain with sciatica 12/07/2007   HEADACHE 12/07/2007   Obesity, Class III, BMI 40-49.9 (morbid obesity) (HCC) 08/03/2007   Anxiety state 08/03/2007   PCP:  Nelwyn Salisbury, MD Pharmacy:   [id number] - [place], [place] - [address] [place] [address] Phone: [phone] Fax: [phone]  [facility] [address] [place] [address] Phone: [phone] Fax: [phone]  [id number] [place], [place] - [address] AT [facility] [address] [place] [address] Phone: [phone] Fax: [phone]     Social Determinants of Health (SDOH) Social History: SDOH Screenings   Food Insecurity: No Food Insecurity (01/28/2023)  Housing: Low Risk  (01/28/2023)  Transportation Needs: No Transportation Needs (01/28/2023)  Utilities: Not At Risk (01/28/2023)  Depression (PHQ2-9): Medium Risk (12/30/2022)  Financial Resource Strain: Low Risk  (05/06/2022)  Physical Activity: Inactive (  05/06/2022)  Stress: No Stress Concern Present (05/06/2022)  Tobacco Use: Medium Risk (01/29/2023)   SDOH Interventions:     Readmission Risk Interventions    01/29/2023    2:51 PM  Readmission Risk Prevention Plan   Post Dischage Appt Complete  Medication Screening Complete  Transportation Screening Complete

## 2023-01-29 NOTE — Assessment & Plan Note (Signed)
BMI 49.95

## 2023-01-29 NOTE — Assessment & Plan Note (Signed)
Initially diagnosed with an acute demyelinating neuropathy last Jan, treated with IVIG, since then has had no clinical change.  Can stand briefly and perform transfers, but mostly dependent for all ADLs on husband.  Saw Neuro on day of admission, NCS showed demyelination, referred to hospital for PLEX. - Consult IR for vascular access - Consult Neurology, appreciate expertise - Obtain SPEP, ANA, SSA, SSB

## 2023-01-29 NOTE — Progress Notes (Signed)
MEDICATION RELATED CONSULT NOTE - INITIAL   Pharmacy Consult for  Pharmacist review PTA and Inpatient med lists for interrupted anticoagulation / antiplatelet orders. May resume prophylaxis drug orders and therapeutic drug orders with prior protocols.  Indication:  VTE prophylaxis   Patient Measurements: Total body weight:  132 kg Height: 5\' 4"  (162.6 cm) IBW/kg (Calculated) : 54.7   Vital Signs: Temp: 98.4 F (36.9 C) (05/15 1142) Temp Source: Oral (05/15 1142) BP: 170/144 (05/15 1142) Pulse Rate: 82 (05/15 1142) Intake/Output from previous day: No intake/output data recorded. Intake/Output from this shift: Total I/O In: 237 [P.O.:237] Out: -   Labs: Recent Labs    01/28/23 1320 01/29/23 1130  WBC 9.1 7.0  HGB 13.9 12.6  HCT 43.8 40.5  PLT 289 256  CREATININE 1.06*  --    Estimated Creatinine Clearance: 79.1 mL/min (A) (by C-G formula based on SCr of 1.06 mg/dL (H)).  Assessment: S/p  placement of tunneled plasmapheresis catheter on 01/29/23.  She is currently on  VTE prophylaxis Lovenox 60mg  q24h, last given last night. Dose adjusted for BMI >30\ with CrCl is > 30 ml/min.  This procedure has standard bleeding risk, thus okay to resume lovenox VTE prophylaxis on next day post procedure.     Plan:  Resume lovenox 60mg  SQ q24h for VTE ppx on 01/30/23 at 10AM.  Recommend to monitor for s/sx of bleeding, renal function changes.  Pharmacy will sign off       Thank you for allowing pharmacy to be part of this patients care team.  Noah Delaine, RPh Clinical Pharmacist 01/29/2023,11:57 AM

## 2023-01-29 NOTE — Progress Notes (Signed)
  Progress Note   [name] WJX:[id number] DOB: October 21, 1965 DOA: 01/28/2023     1 DOS: the patient was seen and examined on 01/29/2023 at 7:25 AM      Brief hospital course: Dana [name] is a 57 y.o. F with obesity, chronic pain/DJD, and HTN as well as recent diagnosis "Dana [name]" sent from Neurology clinic for persistent demyelinating polyradiculoneuropathy.   5/14: Admitted from Neuro clinic for PLEX 5/15: Central line placed     Assessment and Plan: * CIDP (chronic inflammatory demyelinating polyneuropathy) (HCC) Initially diagnosed with an acute demyelinating neuropathy last Jan, treated with IVIG, since then has had no clinical change.  Can stand briefly and perform transfers, but mostly dependent for all ADLs on husband.  Saw Neuro on day of admission, NCS showed demyelination, referred to hospital for PLEX. - Consult IR for vascular access - Consult Neurology, appreciate expertise - Obtain SPEP, ANA, SSA, SSB    Wheelchair dependence    Spinal stenosis of lumbar region with neurogenic claudication - Continue home Flexeril, gabapentin, hydromorphone p.o.  HTN (hypertension) - Continue home metoprolol/Lasix  Obesity, Class III, BMI 40-49.9 (morbid obesity) (HCC) BMI 49.95          Subjective: Patient has no complaints, she has had no improvement in her neuropathy or generalized weakness since she was discharged in February.  No fever overnight.     Physical Exam: BP (!) 170/144 (BP Location: Right Wrist)   Pulse 82   Temp 98.4 F (36.9 C) (Oral)   Resp 17   Ht 5\' 4"  (1.626 m)   LMP 08/17/2012   SpO2 100%   BMI 49.95 kg/m   adult female, lying in bed, no acute distress RRR, no murmurs, no peripheral edema Respiratory rate normal, lungs clear without rales or wheezes She has generalized weakness in the upper extremities, 4/5, with some atrophy of the thenar muscles.  Face symmetric, speech fluent, oriented to person, place, and  time    Data Reviewed: Discussed with neurology Patient metabolic panel and CBC unremarkable  Family Communication: None present    Disposition: Status is: Inpatient Patient will have vascular access placed today, then plasmapheresis started tomorrow, for intention 5 sessions        Author: Alberteen Sam, MD 01/29/2023 12:47 PM  For on call review [email or web address].

## 2023-01-29 NOTE — Hospital Course (Addendum)
Dana [name] is a 57 y.o. F with obesity, chronic pain/DJD, and HTN as well as recent diagnosis "Dana [name]" sent from Neurology clinic for persistent demyelinating polyradiculoneuropathy.   5/14: Admitted from Neuro clinic for PLEX 5/15: Central line placed 5/16: First plasmapharesis treatment 5/17: Plasmapheresis #2 5/20: Plasmapheresis #3

## 2023-01-29 NOTE — Consult Note (Signed)
Chief Complaint: Patient was seen in consultation today for  Chief Complaint  Patient presents with   gullian-barre syndrome     Referring Physician(s): Dr. Wilford Corner   Supervising Physician: Simonne Come  Patient Status: [facility] - In-pt  History of Present Illness: Dana [name] is a 57 y.o. female with hx of guillain-Barre syndrome admitted with worsening weakness and EMG nerve conduction consistent with demyelinating polyradiculoneuropathy. Neurology following pt and recommends plasma exchange. IR is asked to place tunneled catheter for PLEX  PMHx, meds, labs, imaging, allergies reviewed. Feels ok this am, no recent fevers, chills, illness. Has been NPO today   Past Medical History:  Diagnosis Date   Anxiety    Back pain with radiation    Breast mass, right    Chronic pain    Chronic, continuous use of opioids    Complication of anesthesia 04/07/14   Allergic reaction to Lisinopril immediately following surgery   DJD (degenerative joint disease)    Fibromyalgia    Groin abscess    Headache(784.0)    History of IBS    Hypercholesterolemia    IBS (irritable bowel syndrome)    Lactose intolerance    Mild hypertension    Obesity    Tobacco use disorder    Umbilical hernia    Symptomatic    Past Surgical History:  Procedure Laterality Date   ABDOMINAL HYSTERECTOMY     ANTERIOR CERVICAL DECOMP/DISCECTOMY FUSION N/A 12/20/2015   Procedure: ANTERIOR CERVICAL DECOMPRESSION FUSION CERVICAL 4-5, CERVICAL 5-6, CERVICAL 6-7 WITH INSTRUMENTATION AND ALLOGRAFT;  Surgeon: Estill Bamberg, MD;  Location: [facility] OR;  Service: Orthopedics;  Laterality: N/A;  Anterior cervical decompression fusion, cervical 4-5, cervical 5-6, cervical 6-7 with instrumentation and allograft   BACK SURGERY     BILATERAL SALPINGECTOMY  09/03/2012   Procedure: BILATERAL SALPINGECTOMY;  Surgeon: Ok Edwards, MD;  Location: [facility] ORS;  Service: Gynecology;  Laterality: Bilateral;   BREAST BIOPSY Right  04/07/2014   Procedure: REMOVAL RIGHT BREAST MASS WITH WIRE LOCALIZATION;  Surgeon: Adolph Pollack, MD;  Location: [facility] OR;  Service: General;  Laterality: Right;   BREAST EXCISIONAL BIOPSY Left    x2   BREAST LUMPECTOMY     x2   CARPAL TUNNEL RELEASE Left 05/11/2019   Procedure: LEFT CARPAL TUNNEL RELEASE, RIGHT TENNIS ELBOW MARCAINE/DEPO MEDROL INJECTION UNDER ANESTHESIA;  Surgeon: Kerrin Champagne, MD;  Location: [facility] OR;  Service: Orthopedics;  Laterality: Left;   COLONOSCOPY W/ BIOPSIES  04/24/2012   per Dr. Leone Payor, clear, repeat in 10 yrs    disectomy     ESOPHAGOGASTRODUODENOSCOPY     FINGER SURGERY     Right index-excision of mass    FOOT SURGERY Right    Bone Spurs   LAPAROSCOPIC HYSTERECTOMY  09/03/2012   Procedure: HYSTERECTOMY TOTAL LAPAROSCOPIC;  Surgeon: Ok Edwards, MD;  Location: [facility] ORS;  Service: Gynecology;  Laterality: N/A;   LUMBAR DISC SURGERY     TUBAL LIGATION     UMBILICAL HERNIA REPAIR N/A 05/01/2018   Procedure: UMBILICAL HERNIA REPAIR;  Surgeon: Jimmye Norman, MD;  Location: [facility] OR;  Service: General;  Laterality: N/A;    Allergies: Zestril [lisinopril], Codeine, Cyanocobalamin [vitamin b12], Hydrocodone, Penicillins, Norvasc [amlodipine], and Tylenol [acetaminophen]  Medications:  Current Facility-Administered Medications:    ALPRAZolam (XANAX) tablet 0.5 mg, 0.5 mg, Oral, BID PRN, Synetta Fail, MD   ceFAZolin (ANCEF) IVPB 2g/100 mL premix, 2 g, Intravenous, Once, Kerrin Markman, PA-C   cyclobenzaprine (FLEXERIL) tablet 10  mg, 10 mg, Oral, TID PRN, Synetta Fail, MD   dorzolamide-timolol (COSOPT) 2-0.5 % ophthalmic solution 1 drop, 1 drop, Both Eyes, BID, Synetta Fail, MD, 1 drop at 01/28/23 2202   enoxaparin (LOVENOX) injection 60 mg, 60 mg, Subcutaneous, Q24H, Synetta Fail, MD, 60 mg at 01/28/23 2200   furosemide (LASIX) tablet 20 mg, 20 mg, Oral, BID, Synetta Fail, MD, 20 mg at 01/28/23 1740   gabapentin (NEURONTIN)  capsule 900 mg, 900 mg, Oral, TID, Synetta Fail, MD, 900 mg at 01/28/23 2158   hydrALAZINE (APRESOLINE) tablet 25 mg, 25 mg, Oral, Q8H PRN, Danford, Earl Lites, MD   HYDROmorphone (DILAUDID) tablet 2 mg, 2 mg, Oral, Q6H PRN, Synetta Fail, MD, 2 mg at 01/28/23 2158   latanoprost (XALATAN) 0.005 % ophthalmic solution 1 drop, 1 drop, Both Eyes, QHS, Synetta Fail, MD, 1 drop at 01/28/23 2210   metoprolol tartrate (LOPRESSOR) tablet 50 mg, 50 mg, Oral, BID, Synetta Fail, MD, 50 mg at 01/28/23 2158   pantoprazole (PROTONIX) EC tablet 40 mg, 40 mg, Oral, Daily, Synetta Fail, MD   polyethylene glycol (MIRALAX / GLYCOLAX) packet 17 g, 17 g, Oral, Daily PRN, Synetta Fail, MD   sodium chloride flush (NS) 0.9 % injection 3 mL, 3 mL, Intravenous, Q12H, Synetta Fail, MD, 3 mL at 01/28/23 2201   traZODone (DESYREL) tablet 50-100 mg, 50-100 mg, Oral, QHS PRN, Synetta Fail, MD, 50 mg at 01/28/23 2158    Family History  Problem Relation Age of Onset   Diabetes Mother    Diabetes Father    Breast cancer Maternal Aunt 46   Prostate cancer Paternal Grandfather     Social History   Socioeconomic History   Marital status: Married    Spouse name: Loza Bonder   Number of children: 4   Years of education: Not on file   Highest education level: Not on file  Occupational History   Occupation: Psychologist, educational  Tobacco Use   Smoking status: Former    Years: 33    Types: Cigarettes   Smokeless tobacco: Never   Tobacco comments:    quit March 2017  Vaping Use   Vaping Use: Never used  Substance and Sexual Activity   Alcohol use: Not Currently   Drug use: Not Currently    Comment: chronic use of dilaudid   Sexual activity: Not Currently    Birth control/protection: Surgical  Other Topics Concern   Not on file  Social History Narrative   Married - lives with husband 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, fashion/clothing    4 grown children does not live with  her - 1985, 1988, 27 (twins)Grandchildren - 4Updated 10/12/2013   Right handed   Caffeine-none   Social Determinants of Health   Financial Resource Strain: Low Risk  (05/06/2022)   Overall Financial Resource Strain (CARDIA)    Difficulty of Paying Living Expenses: Not hard at all  Food Insecurity: No Food Insecurity (01/28/2023)   Hunger Vital Sign    Worried About Running Out of Food in the Last Year: Never true    Ran Out of Food in the Last Year: Never true  Transportation Needs: No Transportation Needs (01/28/2023)   PRAPARE - Administrator, Civil Service (Medical): No    Lack of Transportation (Non-Medical): No  Physical Activity: Inactive (05/06/2022)   Exercise Vital Sign    Days of Exercise per Week: 0 days    Minutes of  Exercise per Session: 0 min  Stress: No Stress Concern Present (05/06/2022)   [employer] of Occupational Health - Occupational Stress Questionnaire    Feeling of Stress : Not at all  Social Connections: Not on file    Review of Systems: A 12 point ROS discussed and pertinent positives are indicated in the HPI above.  All other systems are negative.  Review of Systems  Vital Signs: BP (!) 137/53 (BP Location: Right Wrist)   Pulse 81   Temp 98.5 F (36.9 C) (Oral)   Resp 17   Ht 5\' 4"  (1.626 m)   LMP 08/17/2012   SpO2 97%   BMI 49.95 kg/m   Physical Exam Constitutional:      Appearance: She is not ill-appearing.  HENT:     Mouth/Throat:     Mouth: Mucous membranes are moist.     Pharynx: Oropharynx is clear.  Cardiovascular:     Rate and Rhythm: Normal rate and regular rhythm.     Heart sounds: Normal heart sounds.  Pulmonary:     Effort: Pulmonary effort is normal. No respiratory distress.     Breath sounds: Normal breath sounds.  Skin:    General: Skin is warm and dry.  Neurological:     General: No focal deficit present.     Mental Status: She is alert and oriented to person, place, and time.  Psychiatric:         Mood and Affect: Mood normal.        Thought Content: Thought content normal.       Imaging: NCV with EMG(electromyography)  Result Date: 01/28/2023 Levert Feinstein, MD     01/28/2023  1:03 PM     Full Name: Dasiah Nees Gender: Female MRN #: [id number] Date of Birth: 1966/06/24   Visit Date: 01/28/2023 09:17 Age: 57 Years Examining Physician: Dr. Levert Feinstein Referring Physician: Dr. Levert Feinstein Height: 5 feet 4 inch History: 58 year old female, presenting with ascending paresthesia, weakness, gait abnormality Summary of the tests: Nerve conduction study: Right sural, superficial peroneal, bilateral median, ulnar and radial sensory responses were absent. Left tibial motor responses were absent.  Left peroneal to EDB motor response showed normal CMAP amplitude at distal stimulation site, but was not able to elicit any response at proximal stimulation site. Right tibial motor responses showed significantly decreased CMAP amplitude, was not able to elicit any response at proximal stimulation side. Bilateral ulnar, right median motor responses showed moderately prolonged distal latency, F-wave latency. Right ulnar F-wave latency was absent.  Bilateral median, left ulnar F-wave latency was prolonged.  Right tibial F-wave latency was absent. Electromyography: Selected needle examinations of right upper, lower extremity muscles, right cervical and lumbosacral paraspinal muscles were performed. There was no evidence of axonal loss, no spontaneous activity, normal morphology motor unit potential was mildly decreased activation. There was polyphasic motor unit potential at right lumbosacral paraspinal muscles.  There was no spontaneous activity at the right cervical paraspinal muscles.   Conclusion: This is an abnormal study.  There is electrodiagnostic evidence of demyelinating polyradiculoneuropathy, there is no evidence of axonal loss. ------------------------------- Levert Feinstein, M.D. Ph.D. [facility] [address], [address] [place], [place] [address] Tel: [phone] Fax: [phone] Verbal informed consent was obtained from the patient, patient was informed of potential risk of procedure, including bruising, bleeding, hematoma formation, infection, muscle weakness, muscle pain, numbness, among others.     MNC   Nerve / Sites Muscle Latency Ref. Amplitude Ref. Rel Amp Segments  Distance Velocity Ref. Area   ms ms mV mV %  cm m/s m/s mVms R Median - APB    Wrist APB 5.6 ?4.4 6.2 ?4.0 100 Wrist - APB 7   27.5    Upper arm APB 9.9  5.6  89.6 Upper arm - Wrist 25 59 ?49 22.3 L Median - APB    Wrist APB 5.0 ?4.4 0.4 ?4.0 100 Wrist - APB 7   1.9    Upper arm APB 12.6  0.6  162 Upper arm - Wrist 25 33 ?49 1.3 R Ulnar - ADM    Wrist ADM 4.1 ?3.3 8.2 ?6.0 100 Wrist - ADM 7   23.9    B.Elbow ADM 6.4  8.7  105 B.Elbow - Wrist 10 44 ?49 27.5    A.Elbow ADM 10.4  6.7  77.8 A.Elbow - B.Elbow 19 47 ?49 23.1 L Ulnar - ADM    Wrist ADM 3.9 ?3.3 8.9 ?6.0 100 Wrist - ADM 7   35.2    B.Elbow ADM 6.1  7.4  83.4 B.Elbow - Wrist 10 46 ?49 29.6    A.Elbow ADM 10.6  7.8  106 A.Elbow - B.Elbow 20 44 ?49 30.0 R Peroneal - EDB    Ankle EDB 6.2 ?6.5 1.4 ?2.0 100 Ankle - EDB 9   4.5    Fib head EDB 14.6  0.2  17.3 Fib head - Ankle 24 29 ?44 0.5    Pop fossa EDB 20.3  0.0  7.5 Pop fossa - Fib head 13 23 ?44 0.0        Pop fossa - Ankle     L Peroneal - EDB    Ankle EDB 6.1 ?6.5 2.5 ?2.0 100 Ankle - EDB 9   7.9    Fib head EDB      Fib head - Ankle   ?44     Pop fossa EDB      Pop fossa - Fib head   ?44         Pop fossa - Ankle     R Tibial - AH    Ankle AH 7.5 ?5.8 1.4 ?4.0 100 Ankle - AH 9   3.3    Pop fossa AH      Pop fossa - Ankle   ?41  L Tibial - AH    Ankle AH NR ?5.8 NR ?4.0 NR Ankle - AH 9   NR                   SNC   Nerve / Sites Rec. Site Peak Lat Ref.  Amp Ref. Segments Distance   ms ms V V  cm R Radial - Anatomical snuff box (Forearm)    Forearm Wrist NR ?2.9 NR ?15 Forearm - Wrist 10 L Radial - Anatomical snuff box (Forearm)     Forearm Wrist NR ?2.9 NR ?15 Forearm - Wrist 10 R Superficial peroneal - Ankle    Lat leg Ankle NR ?4.4 NR ?6 Lat leg - Ankle 14 L Superficial peroneal - Ankle    Lat leg Ankle NR ?4.4 NR ?6 Lat leg - Ankle 14 R Median - Orthodromic (Dig II, Mid palm)    Dig II Wrist NR ?3.4 NR ?10 Dig II - Wrist 13 L Median - Orthodromic (Dig II, Mid palm)    Dig II Wrist NR ?3.4 NR ?10 Dig II - Wrist 13 R Ulnar - Orthodromic, (Dig V, Mid palm)    Dig V Wrist  NR ?3.1 NR ?5 Dig V - Wrist 11 L Ulnar - Orthodromic, (Dig V, Mid palm)    Dig V Wrist NR ?3.1 NR ?5 Dig V - Wrist 24                     F  Wave   Nerve F Lat Ref.  ms ms R Median - APB 33.1 ?31.0 R Ulnar - ADM NR ?32.0 R Peroneal - EDB  NR ?56.0 R Tibial - AH NR ?56.0 L Median - APB 35.5 ?31.0 L Ulnar - ADM 33.8 ?32.0 L Peroneal - EDB NR ?56.0                 EMG Summary Table   Spontaneous MUAP Recruitment Muscle IA Fib PSW Fasc Other Amp Dur. Poly Pattern R. Tibialis anterior Normal None None None _______ Normal Normal Normal Normal R. Tibialis posterior Normal None None None _______ Normal Normal Normal Normal R. Peroneus longus Normal None None None _______ Normal Normal Normal Normal R. Gastrocnemius (Medial head) Normal None None None _______ Normal Normal Normal Normal R. Vastus lateralis Normal None None None _______ Normal Normal Normal Normal R. Lumbar paraspinals (low) Normal None None None _______ Increased Increased 1+ Reduced R. Lumbar paraspinals (mid) Normal None None None _______ Increased Increased 1+ Reduced R. First dorsal interosseous Normal None None None _______ Normal Normal Normal Normal R. Pronator teres Normal None None None _______ Normal Normal Normal Normal R. Biceps brachii Normal None None None _______ Normal Normal Normal Normal R. Deltoid Normal None None None _______ Normal Normal Normal Normal R. Triceps brachii Normal None None None _______ Normal Normal Normal Normal R. Cervical paraspinals Normal None None None _______ Normal Normal  Normal Normal     Labs:  CBC: Recent Labs    10/21/22 0634 10/28/22 0818 11/04/22 0554 01/28/23 1320  WBC 8.6 9.9 7.6 9.1  HGB 10.0* 11.4* 9.9* 13.9  HCT 33.5* 36.4 31.1* 43.8  PLT 304 319 308 289    COAGS: Recent Labs    05/01/22 1617 09/23/22 2318  INR 1.0 1.0  APTT 30.8  --     BMP: Recent Labs    11/04/22 0554 11/05/22 0529 11/06/22 0525 01/28/23 1320  NA 137 139 139 140  K 6.1* 5.5* 4.8 4.2  CL 102 104 105 102  CO2 25 27 25 24   GLUCOSE 93 95 104* 106*  BUN 31* 24* 19 11  CALCIUM 8.8* 8.7* 8.8* 9.7  CREATININE 1.64* 1.30* 1.19* 1.06*  GFRNONAA 36* 48* 53* >60    LIVER FUNCTION TESTS: Recent Labs    09/05/22 1132 09/23/22 1331 10/02/22 1101 10/03/22 0633  BILITOT 0.6 0.9 0.4 0.7  AST 213* 33 39 39  ALT 109* 19 33 31  ALKPHOS 77 79 73 69  PROT 7.5 6.8 8.0 7.5  ALBUMIN 4.4 3.1* 2.8* 2.7*     Assessment and Plan: Guillain-Barre with exacerbating symtoms Plan for plasmapheresis catheter. Risks and benefits of image guided tunneled plasmapheresis catheter placement was discussed with the patient including, but not limited to bleeding, infection, pneumothorax, or fibrin sheath development and need for additional procedures.  All of the patient's questions were answered, patient is agreeable to proceed. Consent signed and in chart.    Electronically Signed: Brayton El, PA-C 01/29/2023, 8:46 AM   I spent a total of 20 minutes in face to face in clinical consultation, greater than 50% of which was counseling/coordinating care for PLEX catheter

## 2023-01-29 NOTE — Assessment & Plan Note (Signed)
-   Continue home Flexeril, gabapentin, hydromorphone p.o.

## 2023-01-30 ENCOUNTER — Ambulatory Visit: Payer: 59 | Admitting: Physical Therapy

## 2023-01-30 ENCOUNTER — Telehealth: Payer: Self-pay | Admitting: Family Medicine

## 2023-01-30 ENCOUNTER — Encounter: Payer: 59 | Admitting: Occupational Therapy

## 2023-01-30 DIAGNOSIS — G6181 Chronic inflammatory demyelinating polyneuritis: Secondary | ICD-10-CM | POA: Diagnosis not present

## 2023-01-30 LAB — CBC WITH DIFFERENTIAL/PLATELET
Abs Immature Granulocytes: 0.03 10*3/uL (ref 0.00–0.07)
Basophils Absolute: 0.1 10*3/uL (ref 0.0–0.1)
Basophils Relative: 1 %
Eosinophils Absolute: 0.2 10*3/uL (ref 0.0–0.5)
Eosinophils Relative: 2 %
HCT: 40.7 % (ref 36.0–46.0)
Hemoglobin: 13 g/dL (ref 12.0–15.0)
Immature Granulocytes: 0 %
Lymphocytes Relative: 26 %
Lymphs Abs: 2.8 10*3/uL (ref 0.7–4.0)
MCH: 27.9 pg (ref 26.0–34.0)
MCHC: 31.9 g/dL (ref 30.0–36.0)
MCV: 87.3 fL (ref 80.0–100.0)
Monocytes Absolute: 0.9 10*3/uL (ref 0.1–1.0)
Monocytes Relative: 8 %
Neutro Abs: 6.9 10*3/uL (ref 1.7–7.7)
Neutrophils Relative %: 63 %
Platelets: 235 10*3/uL (ref 150–400)
RBC: 4.66 MIL/uL (ref 3.87–5.11)
RDW: 14.4 % (ref 11.5–15.5)
WBC: 10.9 10*3/uL — ABNORMAL HIGH (ref 4.0–10.5)
nRBC: 0 % (ref 0.0–0.2)

## 2023-01-30 LAB — SJOGRENS SYNDROME-A EXTRACTABLE NUCLEAR ANTIBODY: SSA (Ro) (ENA) Antibody, IgG: <0.2 AI (ref 0.0–0.9)

## 2023-01-30 LAB — SJOGRENS SYNDROME-B EXTRACTABLE NUCLEAR ANTIBODY: SSB (La) (ENA) Antibody, IgG: <0.2 AI (ref 0.0–0.9)

## 2023-01-30 LAB — GLUCOSE, CAPILLARY: Glucose-Capillary: 124 mg/dL — ABNORMAL HIGH (ref 70–99)

## 2023-01-30 LAB — ANA W/REFLEX IF POSITIVE: Anti Nuclear Antibody (ANA): NEGATIVE

## 2023-01-30 MED ORDER — DIPHENHYDRAMINE HCL 25 MG PO CAPS
25.0000 mg | ORAL_CAPSULE | Freq: Four times a day (QID) | ORAL | Status: DC | PRN
  Administered 2023-01-30: 25 mg via ORAL
  Filled 2023-01-30 (×2): qty 1

## 2023-01-30 MED ORDER — CALCIUM CARBONATE ANTACID 500 MG PO CHEW
2.0000 | CHEWABLE_TABLET | ORAL | Status: AC
  Administered 2023-01-30 (×2): 400 mg via ORAL
  Filled 2023-01-30 (×2): qty 2

## 2023-01-30 MED ORDER — HEPARIN SODIUM (PORCINE) 1000 UNIT/ML IJ SOLN
INTRAMUSCULAR | Status: AC
  Administered 2023-01-30: 1000 [IU]
  Filled 2023-01-30: qty 4

## 2023-01-30 MED ORDER — HYDROMORPHONE HCL 1 MG/ML IJ SOLN
1.0000 mg | INTRAMUSCULAR | Status: DC | PRN
  Administered 2023-02-01 – 2023-02-07 (×5): 1 mg via INTRAVENOUS
  Filled 2023-01-30 (×5): qty 1

## 2023-01-30 MED ORDER — CALCIUM GLUCONATE-NACL 2-0.675 GM/100ML-% IV SOLN
2.0000 g | Freq: Once | INTRAVENOUS | Status: AC
  Administered 2023-01-30: 2000 mg via INTRAVENOUS
  Filled 2023-01-30 (×2): qty 100

## 2023-01-30 MED ORDER — HYDROMORPHONE HCL 2 MG PO TABS
4.0000 mg | ORAL_TABLET | Freq: Four times a day (QID) | ORAL | Status: DC | PRN
  Administered 2023-01-30 – 2023-02-08 (×14): 4 mg via ORAL
  Filled 2023-01-30 (×15): qty 2

## 2023-01-30 MED ORDER — ALBUMIN HUMAN 25 % IV SOLN: Freq: Once | INTRAVENOUS | Status: DC

## 2023-01-30 MED ORDER — ACD FORMULA A 0.73-2.45-2.2 GM/100ML VI SOLN
1000.0000 mL | Status: DC
  Administered 2023-01-30 – 2023-01-31 (×2): 1000 mL
  Filled 2023-01-30 (×4): qty 1000

## 2023-01-30 MED ORDER — ACETAMINOPHEN 325 MG PO TABS: 650.0000 mg | ORAL_TABLET | ORAL | Status: DC | PRN

## 2023-01-30 MED ORDER — HEPARIN SODIUM (PORCINE) 1000 UNIT/ML IJ SOLN
1000.0000 [IU] | Freq: Once | INTRAMUSCULAR | Status: AC
  Filled 2023-01-30: qty 1

## 2023-01-30 MED ORDER — SODIUM CHLORIDE 0.9 % IV SOLN
INTRAVENOUS | Status: AC
  Filled 2023-01-30 (×4): qty 200

## 2023-01-30 NOTE — Telephone Encounter (Signed)
FYI Pt is still admitted at the [name]

## 2023-01-30 NOTE — Telephone Encounter (Signed)
Noted  

## 2023-01-30 NOTE — Progress Notes (Signed)
  Progress Note   [name] [id number]:[id number] DOB: 11/27/1965 DOA: 01/28/2023     2 DOS: the patient was seen and examined on 01/30/2023 at 7:21 AM      Brief hospital course: Mrs. Lutman is a 57 y.o. F with obesity, chronic pain/DJD, and HTN as well as recent diagnosis "[name]" sent from Neurology clinic for persistent demyelinating polyradiculoneuropathy.   5/14: Admitted from Neuro clinic for PLEX 5/15: Central line placed     Assessment and Plan: * CIDP (chronic inflammatory demyelinating polyneuropathy) (HCC) Iron studies unremarkable - Follow SPEP, ANA, SSA/B - Consult neurology for plasmapheresis, appreciate expertise     Wheelchair dependence    Spinal stenosis of lumbar region with neurogenic claudication - Continue home hydromorphone - Continue home Flexeril, gabapentin  HTN (hypertension) Blood pressure controlled - Continue metoprolol, Lasix  Obesity, Class III, BMI 40-49.9 (morbid obesity) (HCC) BMI 49.95          Subjective: No new complaints, no new concerns, no nursing concerns, no fever.     Physical Exam: BP 138/71 (BP Location: Right Arm)   Pulse 80   Temp 98.3 F (36.8 C)   Resp 18   Ht 5\' 4"  (1.626 m)   LMP 08/17/2012   SpO2 100%   BMI 49.95 kg/m   Adult female, sitting on the edge of the bed, eating breakfast, no acute distress RRR, no murmurs, no peripheral edema Respiratory normal, lungs clear without rales or wheezes Attention normal, affect appropriate, judgment and insight appear normal    Data Reviewed: Iron studies normal  Family Communication: None present    Disposition: Status is: Inpatient Vascular access was placed yesterday, plasmapheresis should be started today or tomorrow with intention of 5 sessions       Author: Alberteen Sam, MD 01/30/2023 11:07 AM  For on call review [email or web address].

## 2023-01-30 NOTE — Evaluation (Signed)
Physical Therapy Evaluation Patient Details Name: Dana [name] MRN: [id number] DOB: 1965/12/30 Today's Date: 01/30/2023  History of Present Illness  Patient is a 57 year old female female initially diagnosed with acute demyelinating neuropathy in January and treated with IVIG with no clinical change. She was sent from neurology clinic for evaluation of demyelination.  Admitted for PLEX.  Clinical Impression  Patient is agreeable to PT evaluation. She reports getting outpatient PT and OT twice per week prior to admission. She uses wheelchair as primary means of mobility and uses a steady for transfers at home, however she can stand with assistance at baseline.  The patient is modified independent with bed mobility. She has good sitting balance. Standing performed x 3 bouts with rolling walker for support, Min A for first two stands and Mod A for third stand. Activity tolerance is limited by fatigue with activity with rest breaks required between bouts of standing. The patient is hopeful to resume outpatient PT at discharge. PT will continue to follow while in the hospital to maximize independence and decrease caregiver burden.      Recommendations for follow up therapy are one component of a multi-disciplinary discharge planning process, led by the attending physician.  Recommendations may be updated based on patient status, additional functional criteria and insurance authorization.  Follow Up Recommendations       Assistance Recommended at Discharge Frequent or constant Supervision/Assistance  Patient can return home with the following  A lot of help with walking and/or transfers;A lot of help with bathing/dressing/bathroom;Assistance with cooking/housework;Help with stairs or ramp for entrance;Assist for transportation    Equipment Recommendations None recommended by PT  Recommendations for Other Services       Functional Status Assessment Patient has had a recent decline in their  functional status and demonstrates the ability to make significant improvements in function in a reasonable and predictable amount of time.     Precautions / Restrictions Precautions Precautions: Fall Restrictions Weight Bearing Restrictions: No      Mobility  Bed Mobility Overal bed mobility: Needs Assistance Bed Mobility: Supine to Sit     Supine to sit: Modified independent (Device/Increase time), HOB elevated     General bed mobility comments: patient sitting herself up on edge of bed on arrival to room. increased time required, no physical assistance needed. use of bed rails with head of bed raised slightly    Transfers Overall transfer level: Needs assistance Equipment used: Rolling walker (2 wheels) Transfers: Sit to/from Stand Sit to Stand: Min assist, Mod assist           General transfer comment: 3 standing bouts performed with Min A for the first two stands and Mod A for the second stand. verbal cues for technique. patient declined getting up to chair today    Ambulation/Gait               General Gait Details: patient has been non ambulatory at home, uses wheelchair as primary means of mobility  Stairs            Wheelchair Mobility    Modified Rankin (Stroke Patients Only)       Balance Overall balance assessment: Needs assistance Sitting-balance support: Feet supported Sitting balance-Leahy Scale: Good     Standing balance support: Bilateral upper extremity supported, Reliant on assistive device for balance Standing balance-Leahy Scale: Poor Standing balance comment: relying on rolling walker for support in standing. standing tolerance ~ 45 seconds performed x 3 bouts  Pertinent Vitals/Pain Pain Assessment Pain Assessment: Faces Faces Pain Scale: Hurts a little bit Pain Location: R shoulder Pain Descriptors / Indicators: Discomfort Pain Intervention(s): Limited activity within patient's  tolerance, Monitored during session    Home Living Family/patient expects to be discharged to:: Private residence Living Arrangements: Spouse/significant other Available Help at Discharge: Family;Available 24 hours/day Type of Home: House Home Access: Ramped entrance       Home Layout: One level Home Equipment: Wheelchair - power;Other (comment);BSC/3in1;Tub bench;Hospital bed (Steady) Additional Comments: Patient has outpatient PT and OT on Tues/Thurs at [facility] location. she takes public transportation    Prior Function Prior Level of Function : Needs assist       Physical Assist : Mobility (physical);ADLs (physical) Mobility (physical): Transfers   Mobility Comments: can stand with assistance, uses electric wheelchair for primary means of mobility and Steady for transfers ADLs Comments: set-up from spouse for sponge bathing, dressing, etc     Hand Dominance        Extremity/Trunk Assessment   Upper Extremity Assessment Upper Extremity Assessment: Generalized weakness (grip strength equal. not formally MMT due to acuity of central line placement. generalized weakness throughout)    Lower Extremity Assessment Lower Extremity Assessment: Generalized weakness;LLE deficits/detail;RLE deficits/detail RLE Deficits / Details: 4/5 dorsiflexion and plantaflexion, knee extension 3+/5 RLE Sensation: WNL LLE Deficits / Details: 4/5 dorsiflexion and plantaflexion, knee extension 3+/5 LLE Sensation: WNL       Communication   Communication: No difficulties  Cognition Arousal/Alertness: Awake/alert Behavior During Therapy: WFL for tasks assessed/performed Overall Cognitive Status: Within Functional Limits for tasks assessed                                          General Comments      Exercises     Assessment/Plan    PT Assessment Patient needs continued PT services  PT Problem List Decreased strength;Decreased range of motion;Decreased activity  tolerance;Decreased balance;Decreased mobility;Decreased safety awareness       PT Treatment Interventions DME instruction;Gait training;Stair training;Functional mobility training;Therapeutic activities;Therapeutic exercise;Balance training;Neuromuscular re-education;Cognitive remediation;Patient/family education;Wheelchair mobility training    PT Goals (Current goals can be found in the Care Plan section)  Acute Rehab PT Goals Patient Stated Goal: to return to outpatient PT at [facility] location PT Goal Formulation: With patient Time For Goal Achievement: 02/13/23 Potential to Achieve Goals: Fair    Frequency Min 2X/week     Co-evaluation               AM-PAC PT "6 Clicks" Mobility  Outcome Measure Help needed turning from your back to your side while in a flat bed without using bedrails?: None Help needed moving from lying on your back to sitting on the side of a flat bed without using bedrails?: A Little Help needed moving to and from a bed to a chair (including a wheelchair)?: A Little Help needed standing up from a chair using your arms (e.g., wheelchair or bedside chair)?: A Little Help needed to walk in hospital room?: Total Help needed climbing 3-5 steps with a railing? : Total 6 Click Score: 15    End of Session   Activity Tolerance: Patient tolerated treatment well Patient left: in bed;with call bell/phone within reach (seated on side of bed)   PT Visit Diagnosis: Muscle weakness (generalized) (M62.81);Difficulty in walking, not elsewhere classified (R26.2)    Time: 1610-9604 PT  Time Calculation (min) (ACUTE ONLY): 24 min   Charges:   PT Evaluation $PT Eval Low Complexity: 1 Low         Donis Bernard, PT, MPT   Ina Homes 01/30/2023, 10:53 AM

## 2023-01-30 NOTE — Telephone Encounter (Signed)
pt requesting prescription for HYDROmorphone (DILAUDID) 4 MG tablet be redirected to  [id number] Ginette Otto, [place] - [address] AT [facility] OF [facility] & [address] Phone: [phone]  Fax: [phone]    *see fax note from [facility] requesting no future prescriptions for pain meds be send to them due to patient behavior

## 2023-01-30 NOTE — Telephone Encounter (Signed)
We will figure this out after she gets back home

## 2023-01-30 NOTE — Telephone Encounter (Signed)
Pt is calling and will be in hospital per pt for 10 more days and would like her husband to pick up medication and have on hand when she gets out of hospital

## 2023-01-30 NOTE — Progress Notes (Signed)
   01/30/23 2115  Vitals  Temp 97.7 F (36.5 C)  Pulse Rate 87  Resp 18  BP 126/68  Oxygen Therapy  SpO2 100 %  O2 Device Room Air  Apheresis   Procedure Comments Tx,. Completed. VSS. Report called to 2W bedside RN Sharol Roussel RN  Post-apheresis  Net Removed (mL) 4326 mL  Replacement (mL) 3359 mL  ACDA infused (mL) 811 mL  Total Normal Saline Administered 171 mL  Total Calcium Administered 100 mL  Tolerated Procedure Yes  Post-Procedure Comments Pt. Voiced no Complaints  Hemodialysis Catheter Right Internal jugular Double lumen Permanent (Tunneled)  Placement Date/Time: 01/29/23 0951   Serial / Lot #: [id number], Jonny Ruiz  Expiration Date: 10/06/26  Time Out: Correct patient;Correct site;Correct procedure  Maximum sterile barrier precautions: Hand hygiene;Cap;Mask;Sterile gown;Sterile gloves;Large sterile...  Site Condition No complications  Blue Lumen Status Infusing;Flushed;Dead end cap in place;Heparin locked  Red Lumen Status Infusing;Flushed;Dead end cap in place;Heparin locked  Catheter fill solution Heparin 1000 units/ml  Catheter fill volume (Arterial) 1.9 cc  Catheter fill volume (Venous) 1.9  Dressing Type Transparent  Dressing Status Antimicrobial disc in place  Drainage Description None  Dressing Change Due 11/08/22  Post treatment catheter status Capped and Clamped

## 2023-01-30 NOTE — Consult Note (Signed)
Neurology Progress Note  Brief HPI: 57 year old patient with history of B12 deficiency, anxiety, back pain, right breast mass, chronic opioid use, DJD, fibromyalgia, hyperlipidemia, hypertension, obesity and tobacco use was originally admitted in January for GBS.  She presented 2 days ago from her neurology office with nerve conduction study that showed evidence of demyelinating polyradiculoneuropathy.  She received a dialysis catheter yesterday and will begin Plex treatments today.    Subjective: Patient states that she still has weakness of bilateral lower extremities as well as a "tight" feeling in her hands and feet but no numbness at this time.  She states that the weakness of her lower extremities is unchanged.  She complains of some discomfort on the right side of her neck, likely related to her procedure yesterday.  Exam: Vitals:   01/30/23 0435 01/30/23 0731  BP: 129/63 136/87  Pulse: 77 82  Resp: 16 18  Temp: 98.2 F (36.8 C) 97.7 F (36.5 C)  SpO2: 97% 99%   Gen: In bed, NAD Resp: non-labored breathing, no acute distress Abd: soft, nt  Neuro: Mental Status: Alert and oriented to person, place, time and situation, able to follow simple and complex commands Cranial Nerves: Pupils equal and reactive, extraocular movements intact, facial sensation symmetrical, face symmetrical, phonation normal, hearing intact to voice, shoulder shrug symmetrical, tongue midline Motor: 5/5 strength in bilateral upper extremities, proximal and distal, 4 -/5 strength in left lower extremity at hip flexor, 4/5 strength at hip flexor and right lower extremity, 4/5 strength at the bilateral knee flexion and extension, 4/5 strength with dorsiflexion and plantarflexion bilaterally Sensory: Intact to light touch and symmetrical except for a patch of numbness on the left thigh, subjective "tight" feeling in hands and feet. No clear loss of temp sensation in a length dependent manner. Does endorse colder  temperature in the face compared to the arms/legs DTR: Unable to elicit Gait: Deferred  Pertinent Labs:    Latest Ref Rng & Units 01/29/2023   11:30 AM 01/28/2023    1:20 PM 11/04/2022    5:54 AM  CBC  WBC 4.0 - 10.5 K/uL 7.0  9.1  7.6   Hemoglobin 12.0 - 15.0 g/dL 82.9  56.2  9.9   Hematocrit 36.0 - 46.0 % 40.5  43.8  31.1   Platelets 150 - 400 K/uL 256  289  308        Latest Ref Rng & Units 01/29/2023   11:30 AM 01/28/2023    1:20 PM 11/06/2022    5:25 AM  BMP  Glucose 70 - 99 mg/dL 130  865  784   BUN 6 - 20 mg/dL 9  11  19    Creatinine 0.44 - 1.00 mg/dL 6.96  2.95  2.84   Sodium 135 - 145 mmol/L 139  140  139   Potassium 3.5 - 5.1 mmol/L 3.8  4.2  4.8   Chloride 98 - 111 mmol/L 104  102  105   CO2 22 - 32 mmol/L 24  24  25    Calcium 8.9 - 10.3 mg/dL 8.7  9.7  8.8      Imaging Reviewed:  No imaging this admission  Assessment: 57 year old patient with history of Guillain-Barr and also as above presented for worsening weakness and nerve conduction study consistent with demyelinating polyradiculoneuropathy, likely CIDP.  Patient will begin Plex treatments today.  Symptoms appear stable at this time.  Impression: Progressive acute demyelinating polyradiculoneuropathy  Recommendations: 1) begin Plex treatments and continue every other day for 5  treatments, first treatment today 5/16 2) PT/OT 3) neurology will follow along for PLEX  Cortney E Ernestina Columbia , MSN, AGACNP-BC [facility] See Amion for schedule and pager information 01/30/2023 7:58 AM  Attending Neurologist's note:  I personally saw this patient, gathering history, performing a full neurologic examination, reviewing relevant labs, and formulated the assessment and plan, adding the note above for completeness and clarity to accurately reflect my thoughts   Brooke Dare MD-PhD [facility] [phone] Available 7 AM to 7 PM, outside these hours please contact Neurologist on call  listed on AMION

## 2023-01-31 ENCOUNTER — Telehealth: Payer: Self-pay | Admitting: Family Medicine

## 2023-01-31 ENCOUNTER — Encounter (HOSPITAL_COMMUNITY): Payer: Self-pay

## 2023-01-31 DIAGNOSIS — G6181 Chronic inflammatory demyelinating polyneuritis: Secondary | ICD-10-CM | POA: Diagnosis not present

## 2023-01-31 HISTORY — PX: IR US GUIDE VASC ACCESS RIGHT: IMG2390

## 2023-01-31 LAB — CBC WITH DIFFERENTIAL/PLATELET
Abs Immature Granulocytes: 0.05 10*3/uL (ref 0.00–0.07)
Basophils Absolute: 0.1 10*3/uL (ref 0.0–0.1)
Basophils Relative: 0 %
Eosinophils Absolute: 0.2 10*3/uL (ref 0.0–0.5)
Eosinophils Relative: 2 %
HCT: 42.4 % (ref 36.0–46.0)
Hemoglobin: 13.4 g/dL (ref 12.0–15.0)
Immature Granulocytes: 0 %
Lymphocytes Relative: 17 %
Lymphs Abs: 2.4 10*3/uL (ref 0.7–4.0)
MCH: 27.7 pg (ref 26.0–34.0)
MCHC: 31.6 g/dL (ref 30.0–36.0)
MCV: 87.6 fL (ref 80.0–100.0)
Monocytes Absolute: 1.1 10*3/uL — ABNORMAL HIGH (ref 0.1–1.0)
Monocytes Relative: 8 %
Neutro Abs: 9.8 10*3/uL — ABNORMAL HIGH (ref 1.7–7.7)
Neutrophils Relative %: 73 %
Platelets: 199 10*3/uL (ref 150–400)
RBC: 4.84 MIL/uL (ref 3.87–5.11)
RDW: 14.3 % (ref 11.5–15.5)
WBC: 13.7 10*3/uL — ABNORMAL HIGH (ref 4.0–10.5)
nRBC: 0 % (ref 0.0–0.2)

## 2023-01-31 LAB — PROTEIN ELECTROPHORESIS, SERUM
A/G Ratio: 1 (ref 0.7–1.7)
Albumin ELP: 2.9 g/dL (ref 2.9–4.4)
Alpha-1-Globulin: 0.2 g/dL (ref 0.0–0.4)
Alpha-2-Globulin: 0.9 g/dL (ref 0.4–1.0)
Beta Globulin: 1.1 g/dL (ref 0.7–1.3)
Gamma Globulin: 0.9 g/dL (ref 0.4–1.8)
Globulin, Total: 3 g/dL (ref 2.2–3.9)
Total Protein ELP: 5.9 g/dL — ABNORMAL LOW (ref 6.0–8.5)

## 2023-01-31 MED ORDER — CALCIUM GLUCONATE-NACL 2-0.675 GM/100ML-% IV SOLN
2.0000 g | Freq: Once | INTRAVENOUS | Status: AC
  Administered 2023-01-31: 2000 mg via INTRAVENOUS
  Filled 2023-01-31 (×2): qty 100

## 2023-01-31 MED ORDER — CALCIUM CARBONATE ANTACID 500 MG PO CHEW
2.0000 | CHEWABLE_TABLET | ORAL | Status: AC
  Administered 2023-01-31: 400 mg via ORAL
  Filled 2023-01-31: qty 2

## 2023-01-31 MED ORDER — HEPARIN SODIUM (PORCINE) 1000 UNIT/ML IJ SOLN
1000.0000 [IU] | Freq: Once | INTRAMUSCULAR | Status: AC
  Filled 2023-01-31: qty 1

## 2023-01-31 MED ORDER — HEPARIN SODIUM (PORCINE) 1000 UNIT/ML IJ SOLN
INTRAMUSCULAR | Status: AC
  Filled 2023-01-31: qty 4

## 2023-01-31 MED ORDER — ONDANSETRON HCL 4 MG/2ML IJ SOLN
4.0000 mg | Freq: Four times a day (QID) | INTRAMUSCULAR | Status: DC | PRN
  Administered 2023-01-31 (×2): 4 mg via INTRAVENOUS
  Filled 2023-01-31 (×2): qty 2

## 2023-01-31 MED ORDER — ACETAMINOPHEN 325 MG PO TABS
650.0000 mg | ORAL_TABLET | ORAL | Status: DC | PRN
  Filled 2023-01-31: qty 2

## 2023-01-31 MED ORDER — ACD FORMULA A 0.73-2.45-2.2 GM/100ML VI SOLN
1000.0000 mL | Status: DC
  Filled 2023-01-31 (×3): qty 1000

## 2023-01-31 MED ORDER — DIPHENHYDRAMINE HCL 25 MG PO CAPS
25.0000 mg | ORAL_CAPSULE | Freq: Four times a day (QID) | ORAL | Status: DC | PRN
  Administered 2023-01-31: 25 mg via ORAL
  Filled 2023-01-31: qty 1

## 2023-01-31 MED ORDER — SODIUM CHLORIDE 0.9 % IV SOLN
INTRAVENOUS | Status: AC
  Filled 2023-01-31 (×4): qty 200

## 2023-01-31 NOTE — Telephone Encounter (Signed)
error 

## 2023-01-31 NOTE — Progress Notes (Signed)
Neurology Progress Note  Brief HPI: 57 year old patient with history of B12 deficiency, anxiety, back pain, right breast mass, chronic opioid use, DJD, fibromyalgia, hyperlipidemia, hypertension, obesity and tobacco use was originally admitted in January for GBS.  She presented 2 days ago from her neurology office with nerve conduction study that showed evidence of demyelinating polyradiculoneuropathy.  She received a dialysis catheter yesterday and will begin PLEX treatments today.    Subjective: Patient states that the weakness of her lower extremities is unchanged and she was able to ambulate to the bedside commode with a walker. No issues with PLEX yesterday but she was nauseous this morning. Was able to eat breakfast and is feeling better now.  May have second round of PLEX today.  Exam: Vitals:   01/31/23 0432 01/31/23 0801  BP: 134/65 132/70  Pulse: 74 81  Resp: 16 18  Temp: 98.5 F (36.9 C) 98.1 F (36.7 C)  SpO2: 97% 100%   Gen: In bed, NAD Resp: non-labored breathing, no acute distress Abd: soft, nt  Neuro: Mental Status: Alert and oriented to person, place, time and situation, able to follow simple and complex commands Cranial Nerves: Pupils equal and reactive, extraocular movements intact, facial sensation symmetrical, face symmetrical, phonation normal, hearing intact to voice, shoulder shrug symmetrical, tongue midline Motor: 5/5 strength in bilateral upper extremities, proximal and distal, 4 -/5 strength in left lower extremity at hip flexor, 4/5 strength at hip flexor and right lower extremity, 4/5 strength at the bilateral knee flexion and extension, 4/5 strength with dorsiflexion and plantarflexion bilaterally Sensory: Intact to light touch and symmetrical except for a patch of numbness on the left thigh, subjective "tight" feeling in hands and feet. No clear loss of temp sensation in a length dependent manner. Does endorse colder temperature in the face compared to the  arms/legs DTR: Unable to elicit Gait: Deferred  Pertinent Labs:    Latest Ref Rng & Units 01/30/2023    2:24 PM 01/29/2023   11:30 AM 01/28/2023    1:20 PM  CBC  WBC 4.0 - 10.5 K/uL 10.9  7.0  9.1   Hemoglobin 12.0 - 15.0 g/dL 16.1  09.6  04.5   Hematocrit 36.0 - 46.0 % 40.7  40.5  43.8   Platelets 150 - 400 K/uL 235  256  289        Latest Ref Rng & Units 01/29/2023   11:30 AM 01/28/2023    1:20 PM 11/06/2022    5:25 AM  BMP  Glucose 70 - 99 mg/dL 409  811  914   BUN 6 - 20 mg/dL 9  11  19    Creatinine 0.44 - 1.00 mg/dL 7.82  9.56  2.13   Sodium 135 - 145 mmol/L 139  140  139   Potassium 3.5 - 5.1 mmol/L 3.8  4.2  4.8   Chloride 98 - 111 mmol/L 104  102  105   CO2 22 - 32 mmol/L 24  24  25    Calcium 8.9 - 10.3 mg/dL 8.7  9.7  8.8      Imaging Reviewed:  No imaging this admission  Assessment: 57 year old patient with history of Guillain-Barr and also as above presented for worsening weakness and nerve conduction study consistent with demyelinating polyradiculoneuropathy, likely CIDP.  Patient began PLEX 5/16.  Symptoms appear stable at this time.  Impression: Progressive acute demyelinating polyradiculoneuropathy  Recommendations: 1) PLEX started 5/16 for 5 sessions   Anticipate next session 5/17, then on Monday 2) PT/OT 3)  neurology will follow along for PLEX  Patient seen and examined by NP/APP with MD. MD to update note as needed.   Elmer Picker, DNP, FNP-BC [facility] Pager: [phone]  I have seen the patient reviewed the above note.  We will repeat her Plex today, then every other day following this.  Due to not doing it on Sunday, next treatment will likely be on Monday.  Ritta Slot, MD [facility] [phone]  If 7pm- 7am, please page neurology on call as listed in AMION.

## 2023-01-31 NOTE — Progress Notes (Signed)
   01/31/23 2003  Vitals  Temp 98.1 F (36.7 C)  Temp Source Oral  Pulse Rate 80  Resp 18  BP (!) 123/53  Oxygen Therapy  SpO2 100 %  O2 Device Room Air  Pain Assessment  Pain Scale 0-10  Pain Score 0  Height and Weight  Weight 132 kg  Apheresis   Procedure Comments Tx. Completed without difficulties. Pt. voice no complaints.  Post-apheresis  Net Removed (mL) 4204 mL  Replacement (mL) 3264 mL  ACDA infused (mL) 829 mL  Total Normal Saline Administered 121 mL  Total Calcium Administered 100 mL  Tolerated Procedure Yes  Post-Procedure Comments Pt. VSS, A xOX4. Report called to 2W bedside RN [name].  Hemodialysis Catheter Right Internal jugular Double lumen Permanent (Tunneled)  Placement Date/Time: 01/29/23 0951   Serial / Lot #: [id number], Jonny Ruiz  Expiration Date: 10/06/26  Time Out: Correct patient;Correct site;Correct procedure  Maximum sterile barrier precautions: Hand hygiene;Cap;Mask;Sterile gown;Sterile gloves;Large sterile...  Site Condition No complications  Blue Lumen Status Flushed;Heparin locked;Dead end cap in place  Red Lumen Status Flushed;Heparin locked;Dead end cap in place  Purple Lumen Status N/A  Catheter fill solution Heparin 1000 units/ml  Catheter fill volume (Arterial) 1.9 cc  Catheter fill volume (Venous) 1.9  Dressing Type Transparent  Dressing Status Antimicrobial disc in place  Post treatment catheter status Capped and Clamped   Received patient in bed to unit.  Alert and oriented.  Informed consent signed and in chart.   TX duration:80 mins  Patient tolerated well.  Transported back to the room  Alert, without acute distress.  Hand-off given to patient's nurse.   Access used: Yes Access issues: Pt. Have to run with a slow BFR, due to increase the BFR the machine begins to beep.  Total UF removed: TPE Removal Medication(s) given: See MAR Post HD VS: See Above Grid Post HD weight: No   [facility]

## 2023-01-31 NOTE — Progress Notes (Signed)
[facility] night cross cover note:   I was notified by RN that the patient is complaining of some nausea.  Most recent EKG from 01/28/2023 shows QTc of 465 ms.  I subsequently placed order for as needed iv Zofran for nausea.    Newton Pigg, DO Hospitalist

## 2023-01-31 NOTE — Telephone Encounter (Signed)
Spoke with pt spouse advised that Dr Clent Ridges will send refills for pt after Hospital discharge, verbalized understanding

## 2023-01-31 NOTE — Progress Notes (Signed)
  Progress Note   [name] GNF:[id number] DOB: 06-Dec-1965 DOA: 01/28/2023     3 DOS: the patient was seen and examined on 01/31/2023 at 7:07 AM      Brief hospital course: Mrs. Jarnagin is a 56 y.o. F with obesity, chronic pain/DJD, and HTN as well as recent diagnosis "[name]" sent from Neurology clinic for persistent demyelinating polyradiculoneuropathy.   5/14: Admitted from Neuro clinic for PLEX 5/15: Central line placed 5/16: First plasmapharesis treatment 5/17: Plan for treatment #2 today     Assessment and Plan: * CIDP (chronic inflammatory demyelinating polyneuropathy) (HCC) Iron studies unremarkable, ANA, SSA/B-. - Follow SPEP - Consult neurology for plasmapheresis, appreciate expertise  -Anticipate plasmapheresis session to today   Wheelchair dependence    Spinal stenosis of lumbar region with neurogenic claudication - Continue home hydromorphone, Flexeril, gabapentin   HTN (hypertension) Blood pressure normal - Continue metoprolol, Lasix  Obesity, Class III, BMI 40-49.9 (morbid obesity) (HCC) BMI 49.95          Subjective: No new complaints, no nursing concerns     Physical Exam: BP 132/70 (BP Location: Right Arm)   Pulse 81   Temp 98.1 F (36.7 C) (Oral)   Resp 18   Ht 5\' 4"  (1.626 m)   Wt 132 kg   LMP 08/17/2012   SpO2 100%   BMI 49.95 kg/m   Adult female, sitting up in bed, no acute distress RRR, no murmurs, no peripheral edema Respiratory rate normal, lungs clear without rales or wheezes Generalized weakness, symmetric, face symmetric, speech fluent, oriented to person, place, and time    Data Reviewed: ANA and Sjogren antibodies negative   Family Communication: None present    Disposition: Status is: Inpatient Plan for plasmapheresis for 5 sessions, third session on Monday       Author: Alberteen Sam, MD 01/31/2023 1:03 PM  For on call review [email or web address].

## 2023-02-01 DIAGNOSIS — G6181 Chronic inflammatory demyelinating polyneuritis: Secondary | ICD-10-CM | POA: Diagnosis not present

## 2023-02-01 LAB — CBC
HCT: 40.6 % (ref 36.0–46.0)
Hemoglobin: 12.9 g/dL (ref 12.0–15.0)
MCH: 28.1 pg (ref 26.0–34.0)
MCHC: 31.8 g/dL (ref 30.0–36.0)
MCV: 88.5 fL (ref 80.0–100.0)
Platelets: 177 10*3/uL (ref 150–400)
RBC: 4.59 MIL/uL (ref 3.87–5.11)
RDW: 14.5 % (ref 11.5–15.5)
WBC: 13.3 10*3/uL — ABNORMAL HIGH (ref 4.0–10.5)
nRBC: 0 % (ref 0.0–0.2)

## 2023-02-01 LAB — BASIC METABOLIC PANEL
Anion gap: 8 (ref 5–15)
BUN: 7 mg/dL (ref 6–20)
CO2: 22 mmol/L (ref 22–32)
Calcium: 8.3 mg/dL — ABNORMAL LOW (ref 8.9–10.3)
Chloride: 109 mmol/L (ref 98–111)
Creatinine, Ser: 0.99 mg/dL (ref 0.44–1.00)
GFR, Estimated: >60 mL/min (ref >60)
Glucose, Bld: 184 mg/dL — ABNORMAL HIGH (ref 70–99)
Potassium: 3.7 mmol/L (ref 3.5–5.1)
Sodium: 139 mmol/L (ref 135–145)

## 2023-02-01 NOTE — Progress Notes (Signed)
A consult was placed to IV Therapy to access portacath;  pt has a newly placed HDC from 01/29/23, not a port; scant amount of blood noted under the dressing; pt c/o "it hurts"; offered to change the dressing, however pt stated that she "doesn't want anything done to it until the Dr sees it;"  Pt's nurse also at the bedside.  Will have the RN place a new consult to IV Therapy after the pt has been seen by the MD.

## 2023-02-01 NOTE — Progress Notes (Signed)
  Progress Note   [name]:[id number] DOB: 12-Aug-1966 DOA: 01/28/2023     4 DOS: the patient was seen and examined on 02/01/2023 at 8:20 AM      Brief hospital course: Mrs. Dana [name] is a 57 y.o. F with obesity, chronic pain/DJD, and HTN as well as recent diagnosis "Dana [name]" sent from Neurology clinic for persistent demyelinating polyradiculoneuropathy.   5/14: Admitted from Neuro clinic for PLEX 5/15: Central line placed 5/16: First plasmapharesis treatment 5/17: Plasmapheresis #2     Assessment and Plan: * CIDP (chronic inflammatory demyelinating polyneuropathy) (HCC) Iron studies unremarkable, ANA, SSA/B-.  SPEP unremarkable. - Consult neurology for plasmapheresis, appreciate expertise  - 3 more sessions of plasmapheresis planned Monday Wednesday Friday next week then discharge   Wheelchair dependence    Spinal stenosis of lumbar region with neurogenic claudication - Continue home regimen of hydromorphone, Flexeril, gabapentin  HTN (hypertension) Blood pressure controlled - Continue metoprolol, Lasix  Obesity, Class III, BMI 40-49.9 (morbid obesity) (HCC) BMI 49.95          Subjective: Has pain in her tunnel catheter site.  No other complaints, no nursing concerns.     Physical Exam: BP 129/71 (BP Location: Right Wrist)   Pulse 72   Temp 98.5 F (36.9 C) (Oral)   Resp 17   Ht 5\' 4"  (1.626 m)   Wt 131.7 kg   LMP 08/17/2012   SpO2 98%   BMI 49.84 kg/m   Adult female, sitting in bed, in no acute distress RRR, no murmurs, no peripheral edema Respiratory rate normal, lungs clear without rales or wheezes The tunneled catheter site appears normal    Data Reviewed: SPEP negative   Family Communication: None present    Disposition: Status is: Inpatient Plan for plasmapheresis for 5 sessions, third session on Monday       Author: Alberteen Sam, MD 02/01/2023 11:02 AM  For on call review  [email or web address]

## 2023-02-01 NOTE — Progress Notes (Signed)
Neurology Progress Note  Brief HPI: 57 year old patient with history of B12 deficiency, anxiety, back pain, right breast mass, chronic opioid use, DJD, fibromyalgia, hyperlipidemia, hypertension, obesity and tobacco use was originally admitted in January for GBS.  She presented 2 days ago from her neurology office with nerve conduction study that showed evidence of demyelinating polyradiculoneuropathy.  She received a dialysis catheter yesterday and will begin PLEX treatments today.    Subjective: Patient states that the weakness of her lower extremities is unchanged and she was able to ambulate to the bedside commode with a walker. Reporting pain around dialysis catheter site. Primary team in contact with radiology. No more episodes of nausea today.   Exam: Vitals:   02/01/23 0419 02/01/23 0746  BP: (!) 110/51 129/71  Pulse: 76 72  Resp: 18 17  Temp: 98.1 F (36.7 C) 98.5 F (36.9 C)  SpO2: 99% 98%   Gen: In bed, NAD Resp: non-labored breathing, no acute distress Abd: soft, nt  Neuro: Mental Status: Alert and oriented to person, place, time and situation, able to follow simple and complex commands Cranial Nerves: Pupils equal and reactive, extraocular movements intact, facial sensation symmetrical, face symmetrical, phonation normal, hearing intact to voice, shoulder shrug symmetrical, tongue midline Motor: 5/5 strength in bilateral upper extremities, proximal and distal, 4 -/5 strength in left lower extremity at hip flexor, 4/5 strength at hip flexor and right lower extremity, 4/5 strength at the bilateral knee flexion and extension, 4/5 strength with dorsiflexion and plantarflexion bilaterally Sensory: Intact to light touch and symmetrical except for a patch of numbness on the left thigh, subjective "tight" feeling in hands and feet. No clear loss of temp sensation in a length dependent manner. Does endorse colder temperature in the face compared to the arms/legs DTR: Unable to  elicit Gait: Deferred  Pertinent Labs:    Latest Ref Rng & Units 02/01/2023    4:49 AM 01/31/2023   11:53 AM 01/30/2023    2:24 PM  CBC  WBC 4.0 - 10.5 K/uL 13.3  13.7  10.9   Hemoglobin 12.0 - 15.0 g/dL 19.1  47.8  29.5   Hematocrit 36.0 - 46.0 % 40.6  42.4  40.7   Platelets 150 - 400 K/uL 177  199  235        Latest Ref Rng & Units 02/01/2023    4:49 AM 01/29/2023   11:30 AM 01/28/2023    1:20 PM  BMP  Glucose 70 - 99 mg/dL 621  308  657   BUN 6 - 20 mg/dL 7  9  11    Creatinine 0.44 - 1.00 mg/dL 8.46  9.62  9.52   Sodium 135 - 145 mmol/L 139  139  140   Potassium 3.5 - 5.1 mmol/L 3.7  3.8  4.2   Chloride 98 - 111 mmol/L 109  104  102   CO2 22 - 32 mmol/L 22  24  24    Calcium 8.9 - 10.3 mg/dL 8.3  8.7  9.7      Imaging Reviewed:  No imaging this admission  Assessment: 57 year old patient with history of Guillain-Barr and also as above presented for worsening weakness and nerve conduction study consistent with demyelinating polyradiculoneuropathy, likely CIDP.  Patient began PLEX 5/16.  Symptoms appear stable at this time.  Impression: Progressive acute demyelinating polyradiculoneuropathy  Recommendations: 1) PLEX started 5/16   Session 2- 5/17  Session 3- 5/20 2) PT/OT 3) neurology will follow along for PLEX  Patient seen and examined by  NP/APP with MD. MD to update note as needed.   Elmer Picker, DNP, FNP-BC [facility] Pager: [phone]

## 2023-02-02 DIAGNOSIS — G6181 Chronic inflammatory demyelinating polyneuritis: Secondary | ICD-10-CM | POA: Diagnosis not present

## 2023-02-02 NOTE — Progress Notes (Signed)
  Progress Note   [name] GNF:[id number] DOB: 13-Jun-1966 DOA: 01/28/2023     5 DOS: the patient was seen and examined on 02/02/2023 at 9:56 AM      Brief hospital course: Dana [name] is a 57 y.o. F with obesity, chronic pain/DJD, and HTN as well as recent diagnosis "Dana [name]" sent from Neurology clinic for persistent demyelinating polyradiculoneuropathy.   5/14: Admitted from Neuro clinic for PLEX 5/15: Central line placed 5/16: First plasmapharesis treatment 5/17: Plasmapheresis #2     Assessment and Plan: * CIDP (chronic inflammatory demyelinating polyneuropathy) (HCC) Iron studies unremarkable, ANA, SSA/B-.  SPEP unremarkable. - Consult neurology for plasmapheresis, appreciate expertise  - 3 more sessions of plasmapheresis planned Monday Wednesday Friday next week then discharge   Wheelchair dependence    Spinal stenosis of lumbar region with neurogenic claudication - Continue home regimen of hydromorphone, Flexeril, gabapentin  HTN (hypertension) Blood pressure near controlled - Continue metoprolol, Lasix  Obesity, Class III, BMI 40-49.9 (morbid obesity) (HCC) BMI 49.95          Subjective: Pain at tunneled catheter site is better with removing stuures.  Patient has no other concerns today, no nursing concerns.        Physical Exam: BP (!) 141/79 (BP Location: Right Arm)   Pulse 80   Temp 98.2 F (36.8 C) (Oral)   Resp 17   Ht 5\' 4"  (1.626 m)   Wt 128.9 kg   LMP 08/17/2012   SpO2 100%   BMI 48.78 kg/m   Adult female, lying in bed, interactive and appropriate, no acute distress, respirations appear normal, abdomen without distention or ascites.  Appears globally weak.       Data Reviewed: No new labs   Family Communication: None present    Disposition: Status is: Inpatient Plan for plasmapheresis for 5 sessions, third session on Monday, last session Friday, then home after that       Author: Alberteen Sam, MD 02/02/2023 10:55 AM  For on call review [email or web address].

## 2023-02-02 NOTE — Progress Notes (Signed)
Neurology Progress Note  Brief HPI: 57 year old patient with history of B12 deficiency, anxiety, back pain, right breast mass, chronic opioid use, DJD, fibromyalgia, hyperlipidemia, hypertension, obesity and tobacco use was originally admitted in January for GBS.  She presented 2 days ago from her neurology office with nerve conduction study that showed evidence of demyelinating polyradiculoneuropathy.  She received a dialysis catheter yesterday and will begin PLEX treatments today.    Subjective: 3rd PLEX session scheduled for Monday.  She states she was able to ambulate in her room with a walker.  PT plans to see her today  Exam: Vitals:   02/01/23 2153 02/02/23 0842  BP: (!) 149/69 (!) 141/79  Pulse: 83 80  Resp: 16 17  Temp: 98.6 F (37 C) 98.2 F (36.8 C)  SpO2: 100% 100%   Gen: In bed, NAD Resp: non-labored breathing, no acute distress Abd: soft, nt  Neuro: Mental Status: Alert and oriented to person, place, time and situation, able to follow simple and complex commands Cranial Nerves: Pupils equal and reactive, extraocular movements intact, facial sensation symmetrical, face symmetrical, phonation normal, hearing intact to voice, shoulder shrug symmetrical, tongue midline Motor: 5/5 strength in bilateral upper extremities, proximal and distal, 4 -/5 strength in left lower extremity at hip flexor, 4/5 strength at hip flexor and right lower extremity, 4/5 strength at the bilateral knee flexion and extension, 4/5 strength with dorsiflexion and plantarflexion bilaterally Sensory: Intact to light touch and symmetrical except for a patch of numbness on the left thigh, subjective "tight" feeling in hands and feet. No clear loss of temp sensation in a length dependent manner. Does endorse colder temperature in the face compared to the arms/legs DTR: Unable to elicit Gait: Deferred  Pertinent Labs:    Latest Ref Rng & Units 02/01/2023    4:49 AM 01/31/2023   11:53 AM 01/30/2023    2:24  PM  CBC  WBC 4.0 - 10.5 K/uL 13.3  13.7  10.9   Hemoglobin 12.0 - 15.0 g/dL 16.1  09.6  04.5   Hematocrit 36.0 - 46.0 % 40.6  42.4  40.7   Platelets 150 - 400 K/uL 177  199  235        Latest Ref Rng & Units 02/01/2023    4:49 AM 01/29/2023   11:30 AM 01/28/2023    1:20 PM  BMP  Glucose 70 - 99 mg/dL 409  811  914   BUN 6 - 20 mg/dL 7  9  11    Creatinine 0.44 - 1.00 mg/dL 7.82  9.56  2.13   Sodium 135 - 145 mmol/L 139  139  140   Potassium 3.5 - 5.1 mmol/L 3.7  3.8  4.2   Chloride 98 - 111 mmol/L 109  104  102   CO2 22 - 32 mmol/L 22  24  24    Calcium 8.9 - 10.3 mg/dL 8.3  8.7  9.7      Imaging Reviewed:  No imaging this admission  Assessment: 57 year old patient with history of Guillain-Barr and also as above presented for worsening weakness and nerve conduction study consistent with demyelinating polyradiculoneuropathy, likely CIDP.  Patient began PLEX 5/16.  Symptoms appear stable at this time.  Impression: Progressive acute demyelinating polyradiculoneuropathy  Recommendations: 1) PLEX started 5/16   Session 2- 5/17  Session 3- 5/20 Will need total of five sessions 2) PT/OT 3) neurology will follow along for PLEX  Patient seen and examined by NP/APP with MD. MD to update note as needed.  Elmer Picker, DNP, FNP-BC [facility] Pager: [phone]  I have reviewed the above note, plan to continue plasma exchange as outlined above.  Ritta Slot, MD [facility] [phone]  If 7pm- 7am, please page neurology on call as listed in AMION.

## 2023-02-02 NOTE — Progress Notes (Signed)
Physical Therapy Treatment Patient Details Name: Dana [name] MRN: [id number] DOB: Mar 06, 1966 Today's Date: 02/02/2023   History of Present Illness Patient is a 57 year old female female initially diagnosed with acute demyelinating neuropathy in January and treated with IVIG with no clinical change. She was sent from neurology clinic for evaluation of demyelination.  Admitted for PLEX.    PT Comments    Pt progressing towards her physical therapy goals and motivated to participate. Focus of session of strengthening for proximal and core musculature, transfer training and pre gait training. Pt requiring min assist for transfers to standing and able to take ~3 steps forwards/back/laterally. Increased R knee pain with weightbearing. Ice provided. Will benefit from continued OPPT at d/c.    Recommendations for follow up therapy are one component of a multi-disciplinary discharge planning process, led by the attending physician.  Recommendations may be updated based on patient status, additional functional criteria and insurance authorization.  Follow Up Recommendations       Assistance Recommended at Discharge Frequent or constant Supervision/Assistance  Patient can return home with the following Assistance with cooking/housework;Help with stairs or ramp for entrance;Assist for transportation;A little help with walking and/or transfers;A little help with bathing/dressing/bathroom   Equipment Recommendations  None recommended by PT    Recommendations for Other Services       Precautions / Restrictions Precautions Precautions: Fall Restrictions Weight Bearing Restrictions: No     Mobility  Bed Mobility Overal bed mobility: Modified Independent             General bed mobility comments: Increased time, use of rail    Transfers Overall transfer level: Needs assistance Equipment used: Rolling walker (2 wheels) Transfers: Sit to/from Stand Sit to Stand: Min assist            General transfer comment: MinA to rise; increased time/effort    Ambulation/Gait Ambulation/Gait assistance: Min assist Gait Distance (Feet): 3 Feet Assistive device: Rolling walker (2 wheels) Gait Pattern/deviations: Step-to pattern, Decreased stance time - right, Decreased weight shift to right, Trunk flexed Gait velocity: decreased Gait velocity interpretation: <1.31 ft/sec, indicative of household ambulator   General Gait Details: Cues for glute activation, upright posture, weight shifting, and scapular depression. Chair brought up behind pt   Stairs             Wheelchair Mobility    Modified Rankin (Stroke Patients Only)       Balance Overall balance assessment: Needs assistance Sitting-balance support: Feet supported Sitting balance-Leahy Scale: Good     Standing balance support: Bilateral upper extremity supported, Reliant on assistive device for balance Standing balance-Leahy Scale: Poor Standing balance comment: relying on rolling walker for support in standing. standing tolerance ~ 45 seconds performed x 3 bouts                            Cognition Arousal/Alertness: Awake/alert Behavior During Therapy: WFL for tasks assessed/performed Overall Cognitive Status: Within Functional Limits for tasks assessed                                          Exercises General Exercises - Lower Extremity Long Arc Quad: Both, 15 reps, Seated (1st set: 10 reps, 2nd set: 2 reps) Hip ABduction/ADduction: Both, 20 reps, Supine (2 sets of 10 reps) Other Exercises Other Exercises: Sitting: Paloff press x  15, russian twists x 20 for core Other Exercises: Supine: bridging x 5 (2 sets) Other Exercises: sit to stands x 3. Other Exercises: Standing: weight shifts to R/L x 10    General Comments        Pertinent Vitals/Pain Pain Assessment Pain Assessment: Faces Faces Pain Scale: Hurts even more Pain Location: R knee Pain Descriptors /  Indicators: Discomfort, Sharp, Grimacing, Guarding Pain Intervention(s): Limited activity within patient's tolerance, Monitored during session, Ice applied    Home Living                          Prior Function            PT Goals (current goals can now be found in the care plan section) Acute Rehab PT Goals Patient Stated Goal: to return to outpatient PT at [facility] location PT Goal Formulation: With patient Time For Goal Achievement: 02/13/23 Potential to Achieve Goals: Good Progress towards PT goals: Progressing toward goals    Frequency    Min 3X/week      PT Plan Frequency needs to be updated    Co-evaluation              AM-PAC PT "6 Clicks" Mobility   Outcome Measure  Help needed turning from your back to your side while in a flat bed without using bedrails?: None Help needed moving from lying on your back to sitting on the side of a flat bed without using bedrails?: None Help needed moving to and from a bed to a chair (including a wheelchair)?: A Little Help needed standing up from a chair using your arms (e.g., wheelchair or bedside chair)?: A Little Help needed to walk in hospital room?: Total Help needed climbing 3-5 steps with a railing? : Total 6 Click Score: 16    End of Session   Activity Tolerance: Patient tolerated treatment well Patient left: in bed;with call bell/phone within reach (seated on side of bed)   PT Visit Diagnosis: Muscle weakness (generalized) (M62.81);Difficulty in walking, not elsewhere classified (R26.2)     Time: 1458-1550 PT Time Calculation (min) (ACUTE ONLY): 52 min  Charges:  $Therapeutic Exercise: 8-22 mins $Therapeutic Activity: 23-37 mins                     Lillia Pauls, PT, DPT Acute Rehabilitation Services Office [phone]    Norval Morton 02/02/2023, 4:01 PM

## 2023-02-03 DIAGNOSIS — G6181 Chronic inflammatory demyelinating polyneuritis: Secondary | ICD-10-CM | POA: Diagnosis not present

## 2023-02-03 LAB — BASIC METABOLIC PANEL
Anion gap: 10 (ref 5–15)
BUN: 19 mg/dL (ref 6–20)
CO2: 25 mmol/L (ref 22–32)
Calcium: 8.6 mg/dL — ABNORMAL LOW (ref 8.9–10.3)
Chloride: 102 mmol/L (ref 98–111)
Creatinine, Ser: 1.2 mg/dL — ABNORMAL HIGH (ref 0.44–1.00)
GFR, Estimated: 53 mL/min — ABNORMAL LOW (ref >60)
Glucose, Bld: 135 mg/dL — ABNORMAL HIGH (ref 70–99)
Potassium: 3.6 mmol/L (ref 3.5–5.1)
Sodium: 137 mmol/L (ref 135–145)

## 2023-02-03 LAB — CBC WITH DIFFERENTIAL/PLATELET
Abs Immature Granulocytes: 0.03 10*3/uL (ref 0.00–0.07)
Basophils Absolute: 0.1 10*3/uL (ref 0.0–0.1)
Basophils Relative: 1 %
Eosinophils Absolute: 0.1 10*3/uL (ref 0.0–0.5)
Eosinophils Relative: 1 %
HCT: 40.7 % (ref 36.0–46.0)
Hemoglobin: 13.2 g/dL (ref 12.0–15.0)
Immature Granulocytes: 0 %
Lymphocytes Relative: 20 %
Lymphs Abs: 2.3 10*3/uL (ref 0.7–4.0)
MCH: 27.9 pg (ref 26.0–34.0)
MCHC: 32.4 g/dL (ref 30.0–36.0)
MCV: 86 fL (ref 80.0–100.0)
Monocytes Absolute: 0.9 10*3/uL (ref 0.1–1.0)
Monocytes Relative: 7 %
Neutro Abs: 8.4 10*3/uL — ABNORMAL HIGH (ref 1.7–7.7)
Neutrophils Relative %: 71 %
Platelets: 166 10*3/uL (ref 150–400)
RBC: 4.73 MIL/uL (ref 3.87–5.11)
RDW: 14.4 % (ref 11.5–15.5)
WBC: 11.9 10*3/uL — ABNORMAL HIGH (ref 4.0–10.5)
nRBC: 0 % (ref 0.0–0.2)

## 2023-02-03 LAB — ABO/RH
ABO/RH(D): B POS
ABO/RH(D): B POS

## 2023-02-03 MED ORDER — ACETAMINOPHEN 325 MG PO TABS
650.0000 mg | ORAL_TABLET | ORAL | Status: DC | PRN
  Filled 2023-02-03: qty 2

## 2023-02-03 MED ORDER — SODIUM CHLORIDE 0.9 % IV SOLN
INTRAVENOUS | Status: AC
  Filled 2023-02-03 (×4): qty 200

## 2023-02-03 MED ORDER — HEPARIN SODIUM (PORCINE) 1000 UNIT/ML IJ SOLN
INTRAMUSCULAR | Status: AC
  Filled 2023-02-03: qty 4

## 2023-02-03 MED ORDER — DIPHENHYDRAMINE HCL 25 MG PO CAPS
25.0000 mg | ORAL_CAPSULE | Freq: Four times a day (QID) | ORAL | Status: DC | PRN
  Administered 2023-02-03: 25 mg via ORAL
  Filled 2023-02-03: qty 1

## 2023-02-03 MED ORDER — CALCIUM CARBONATE ANTACID 500 MG PO CHEW
2.0000 | CHEWABLE_TABLET | ORAL | Status: AC
  Administered 2023-02-03: 400 mg via ORAL
  Filled 2023-02-03 (×2): qty 2

## 2023-02-03 MED ORDER — CALCIUM GLUCONATE-NACL 2-0.675 GM/100ML-% IV SOLN
2.0000 g | Freq: Once | INTRAVENOUS | Status: AC
  Administered 2023-02-03: 2000 mg via INTRAVENOUS
  Filled 2023-02-03 (×2): qty 100

## 2023-02-03 MED ORDER — ACD FORMULA A 0.73-2.45-2.2 GM/100ML VI SOLN
1000.0000 mL | Status: DC
  Administered 2023-02-03: 1000 mL
  Filled 2023-02-03: qty 1000

## 2023-02-03 MED ORDER — HEPARIN SODIUM (PORCINE) 1000 UNIT/ML IJ SOLN
1000.0000 [IU] | Freq: Once | INTRAMUSCULAR | Status: AC
  Administered 2023-02-03: 1000 [IU]

## 2023-02-03 NOTE — Progress Notes (Signed)
   02/03/23 2030  Vitals  Temp 98.4 F (36.9 C)  Temp Source Oral  Pulse Rate 78  Resp 17  BP 121/63  Oxygen Therapy  SpO2 100 %  O2 Device Room Air  Pain Assessment  Pain Scale 0-10  Pain Score Asleep  Height and Weight  Weight 128 kg  Apheresis   Albumin Bag Number 4  Albumin Bag Volume 1000 mL  Procedure Comments Pt resting with eyes closed. Tx. completed. blood returned without difficulties. VSS stable . Report to 2W bedside RN  Post-apheresis  Net Removed (mL) 4183 mL  Replacement (mL) 3307 mL  ACDA infused (mL) 833 mL  Total Normal Saline Administered 165 mL  Tolerated Procedure Yes  Post-Procedure Comments Pt. remains resting with eyes closed.  Hemodialysis Catheter Right Internal jugular Double lumen Permanent (Tunneled)  Placement Date/Time: 01/29/23 0951   Serial / Lot #: [id number], Jonny Ruiz  Expiration Date: 10/06/26  Time Out: Correct patient;Correct site;Correct procedure  Maximum sterile barrier precautions: Hand hygiene;Cap;Mask;Sterile gown;Sterile gloves;Large sterile...  Blue Lumen Status Flushed;Heparin locked;Dead end cap in place;Blood return noted  Red Lumen Status Flushed;Heparin locked;Blood return noted;Dead end cap in place  Dressing Type Transparent  Dressing Status Antimicrobial disc in place  Drainage Description Serosanguineous  Dressing Change Due 02/10/23  Post treatment catheter status Capped and Clamped   Received patient in bed to unit.  Alert and oriented.  Informed consent signed and in chart.   TX duration:  Patient tolerated well.  Transported back to the room  Alert, without acute distress.  Hand-off given to patient's nurse.   Access used: Yes Access issues: No, just serosanguineous remains at site, no active bleeding noted  Total UF removed: 0 Medication(s) given:  See MAR Post HD VS: See Above Grid Post HD weight: 0   [name]

## 2023-02-03 NOTE — Care Management Important Message (Signed)
Important Message  Patient Details  Name: Dana [name] MRN: [id number] Date of Birth: 1966-05-14   Medicare Important Message Given:  Yes     Dorena Bodo 02/03/2023, 2:39 PM

## 2023-02-03 NOTE — Progress Notes (Signed)
57 y.o. female history of HTN, IBS, HLD, fibromyalgia , Guillain Barre,chronic pain, anxiety. Found to have right leg weakness/ Admitted for worsening  Guillain Barre Syndrome.  IR placed a RIJ tunneled catheter placement for plasmapheresis on 5.15.24. Patient see at bedside due to oozing at catheter exit site. Per Patient and her Team the catheter is functional.   No active bleeding at time of evaluation. Dressing noted to have a little blood on the lateral aspect. As there is no active bleeding at the time of the assessment no immediate intervention is required. As to not disturb the clot formation the dressing not changed. Should oozing return recommend Ms Crump be placed in high fowler's position and manuel pressure being held at the venotomy site for 10 minutes. Should oozing not subside recommend thrombin and /or a purse string sutures be applied to the exit site. If that is unsuccessful consideration could be given to move the catheter from the RIJ to the LIJ.  As the catheter is functional and the Patient states that she only has 3 more treatments remaining continue with existing catheter as is. Should condition change please notify IR. Patient verbalized understanding and is in agreement with the plan of care.

## 2023-02-03 NOTE — Progress Notes (Signed)
Neurology Progress Note  Brief HPI: 57 year old patient with history of B12 deficiency, anxiety, back pain, right breast mass, chronic opioid use, DJD, fibromyalgia, hyperlipidemia, hypertension, obesity and tobacco use was originally admitted in January for GBS.  She presented 2 days ago from her neurology office with nerve conduction study that showed evidence of demyelinating polyradiculoneuropathy.  She is currently undergoing PLEX per Dr. Zannie Cove recommendation which we concur with.  Subjective: 3rd PLEX session scheduled for today. She had some bleeding around her catheter today. No other new complaints.  Exam: Vitals:   02/03/23 0502 02/03/23 0731  BP: (!) 142/71 (!) 134/95  Pulse: 81 70  Resp: 17 18  Temp: 98 F (36.7 C) 97.7 F (36.5 C)  SpO2: 99% 99%   Gen: In bed, NAD Resp: non-labored breathing, no acute distress Abd: soft, nt  Neuro: Mental Status: Alert and oriented to person, place, time and situation, able to follow simple and complex commands Cranial Nerves: Pupils equal and reactive, extraocular movements intact, facial sensation symmetrical, face symmetrical, phonation normal, hearing intact to voice, shoulder shrug symmetrical, tongue midline Motor: 5/5 strength in bilateral upper extremities, proximal and distal, 4 -/5 strength in left lower extremity at hip flexor, 4/5 strength at hip flexor and right lower extremity, 4/5 strength at the bilateral knee flexion and extension, 4/5 strength with dorsiflexion and plantarflexion bilaterally Sensory: Intact to light touch and symmetrical except for a patch of numbness on the left thigh, subjective "tight" feeling in hands and feet. No clear loss of temp sensation in a length dependent manner. Does endorse colder temperature in the face compared to the arms/legs DTR: Unable to elicit Gait: Deferred  Pertinent Labs:    Latest Ref Rng & Units 02/03/2023   10:53 AM 02/01/2023    4:49 AM 01/31/2023   11:53 AM  CBC  WBC 4.0  - 10.5 K/uL 11.9  13.3  13.7   Hemoglobin 12.0 - 15.0 g/dL 21.3  08.6  57.8   Hematocrit 36.0 - 46.0 % 40.7  40.6  42.4   Platelets 150 - 400 K/uL 166  177  199        Latest Ref Rng & Units 02/01/2023    4:49 AM 01/29/2023   11:30 AM 01/28/2023    1:20 PM  BMP  Glucose 70 - 99 mg/dL 469  629  528   BUN 6 - 20 mg/dL 7  9  11    Creatinine 0.44 - 1.00 mg/dL 4.13  2.44  0.10   Sodium 135 - 145 mmol/L 139  139  140   Potassium 3.5 - 5.1 mmol/L 3.7  3.8  4.2   Chloride 98 - 111 mmol/L 109  104  102   CO2 22 - 32 mmol/L 22  24  24    Calcium 8.9 - 10.3 mg/dL 8.3  8.7  9.7      Imaging Reviewed:  No imaging this admission  Assessment: 57 year old patient with history of Guillain-Barr and also as above presented for worsening weakness and nerve conduction study consistent with demyelinating polyradiculoneuropathy, likely CIDP.  Patient began PLEX 5/16.  Symptoms appear stable at this time. Dr. Maryfrances Bunnell is going to call IR to have them take another look at the catheter.  Impression: Progressive acute demyelinating polyradiculoneuropathy  Recommendations: 1) PLEX started 5/16   Session 2- 5/17  Session 3- 5/20 Will need total of five sessions 2) PT/OT 3) neurology will follow along for PLEX  Bing Neighbors, MD [facility] [phone]  If 7pm-  7am, please page neurology on call as listed in AMION.

## 2023-02-03 NOTE — Progress Notes (Signed)
  Progress Note   [name]:[id number] DOB: June 26, 1966 DOA: 01/28/2023     6 DOS: the patient was seen and examined on 02/03/2023 at 10:59 AM      Brief hospital course: Dana [name] is a 57 y.o. F with obesity, chronic pain/DJD, and HTN as well as recent diagnosis "Dana [name]" sent from Neurology clinic for persistent demyelinating polyradiculoneuropathy.   5/14: Admitted from Neuro clinic for PLEX 5/15: Central line placed 5/16: First plasmapharesis treatment 5/17: Plasmapheresis #2 5/20: Plasmapheresis #3     Assessment and Plan: * CIDP (chronic inflammatory demyelinating polyneuropathy) (HCC) Iron studies unremarkable, ANA, SSA/B-.  SPEP unremarkable. - Consult neurology for plasmapheresis, appreciate expertise  - 3 more sessions of plasmapheresis planned Monday Wednesday Friday next week then discharge   Wheelchair dependence    Spinal stenosis of lumbar region with neurogenic claudication - Continue home regimen of hydromorphone, Flexeril, gabapentin  Hypertension Blood pressure controlled - Continue metoprolol, Lasix  Obesity, Class III, BMI 40-49.9 (morbid obesity) (HCC) BMI 49.95  Osteoarthritis of right knee I will reach out to orthopedics as a courtesy, suspect she should follow-up with them after discharge.        Subjective: No complaints other than her right knee arthritis which is chronic.  No nursing concerns.     Physical Exam: BP (!) 134/95 (BP Location: Right Wrist)   Pulse 70   Temp 97.7 F (36.5 C) (Oral)   Resp 18   Ht 5\' 4"  (1.626 m)   Wt 128 kg   LMP 08/17/2012   SpO2 99%   BMI 48.44 kg/m   Adult female, lying in bed, interactive and appropriate, no acute distress, respirations normal, cor RRR, no murmurs, no peripheral edema, attention normal, affect appropriate, judgment insight appear normal    Data Reviewed: No new labs   Family Communication: None present    Disposition: Status is:  Inpatient Plan for plasmapheresis for 5 sessions, third session on Monday, last session Friday, then home after that       Author: Alberteen Sam, MD 02/03/2023 12:05 PM  For on call review [email or web address].

## 2023-02-03 NOTE — Progress Notes (Signed)
Physical Therapy Treatment Patient Details Name: Dana [name] MRN: [id number] DOB: [birth year] Today's Date: 02/03/2023   History of Present Illness Patient is a 57 year old female female initially diagnosed with acute demyelinating neuropathy in January and treated with IVIG with no clinical change. She was sent from neurology clinic for evaluation of demyelination.  Admitted for PLEX.    PT Comments    Pt received in bed, agreeable and eager to participate in therapy. She demo mod I bed mobility. Min assist sit to stand, and amb 3' with RW bed to recliner. Pt performed LE exercises supine, sitting, and standing with RW. Pt in recliner with feet elevated at end of session. Pt primary c/o is R knee pain. Pt asking about having injection in R knee from ortho.    Recommendations for follow up therapy are one component of a multi-disciplinary discharge planning process, led by the attending physician.  Recommendations may be updated based on patient status, additional functional criteria and insurance authorization.  Follow Up Recommendations       Assistance Recommended at Discharge Frequent or constant Supervision/Assistance  Patient can return home with the following Assistance with cooking/housework;Help with stairs or ramp for entrance;Assist for transportation;A little help with walking and/or transfers;A little help with bathing/dressing/bathroom   Equipment Recommendations  None recommended by PT    Recommendations for Other Services       Precautions / Restrictions Precautions Precautions: Fall Restrictions Weight Bearing Restrictions: No     Mobility  Bed Mobility Overal bed mobility: Modified Independent             General bed mobility comments: Increased time, use of rail    Transfers Overall transfer level: Needs assistance Equipment used: Rolling walker (2 wheels) Transfers: Sit to/from Stand Sit to Stand: Min assist           General transfer  comment: increased time, assist to power up    Ambulation/Gait Ambulation/Gait assistance: Min assist Gait Distance (Feet): 3 Feet Assistive device: Rolling walker (2 wheels) Gait Pattern/deviations: Step-to pattern, Decreased stance time - right, Decreased weight shift to right, Trunk flexed, Antalgic Gait velocity: decreased     General Gait Details: slow, guarded gait with heavy reliance on RW. Pain R knee   Stairs             Wheelchair Mobility    Modified Rankin (Stroke Patients Only)       Balance Overall balance assessment: Needs assistance Sitting-balance support: Feet supported, No upper extremity supported Sitting balance-Leahy Scale: Good     Standing balance support: Bilateral upper extremity supported, Reliant on assistive device for balance, During functional activity Standing balance-Leahy Scale: Poor                              Cognition Arousal/Alertness: Awake/alert Behavior During Therapy: WFL for tasks assessed/performed Overall Cognitive Status: Within Functional Limits for tasks assessed                                          Exercises General Exercises - Lower Extremity Ankle Circles/Pumps: AROM, Both, 10 reps, Seated Gluteal Sets: AROM, Both, 10 reps Long Arc Quad: AROM, Right, Left, 10 reps, Seated Hip ABduction/ADduction: Both, AROM, 10 reps, Seated Straight Leg Raises: AAROM, Right, Left, 5 reps Hip Flexion/Marching: AROM, Right, Left, 5 reps, Standing  Heel Raises: AROM, Both, 10 reps, Standing Mini-Sqauts: AROM, Both, 5 reps, Standing    General Comments        Pertinent Vitals/Pain Pain Assessment Pain Assessment: Faces Faces Pain Scale: Hurts whole lot Pain Location: R knee with mobility/exercises Pain Descriptors / Indicators: Discomfort, Sharp, Grimacing, Guarding Pain Intervention(s): Limited activity within patient's tolerance, Monitored during session, Repositioned    Home Living                           Prior Function            PT Goals (current goals can now be found in the care plan section) Acute Rehab PT Goals Patient Stated Goal: to return to outpatient PT at [facility] location Progress towards PT goals: Progressing toward goals    Frequency    Min 3X/week      PT Plan Current plan remains appropriate    Co-evaluation              AM-PAC PT "6 Clicks" Mobility   Outcome Measure  Help needed turning from your back to your side while in a flat bed without using bedrails?: None Help needed moving from lying on your back to sitting on the side of a flat bed without using bedrails?: None Help needed moving to and from a bed to a chair (including a wheelchair)?: A Little Help needed standing up from a chair using your arms (e.g., wheelchair or bedside chair)?: A Little Help needed to walk in hospital room?: Total Help needed climbing 3-5 steps with a railing? : Total 6 Click Score: 16    End of Session Equipment Utilized During Treatment: Gait belt Activity Tolerance: Patient tolerated treatment well Patient left: in chair;with call bell/phone within reach Nurse Communication: Mobility status PT Visit Diagnosis: Muscle weakness (generalized) (M62.81);Difficulty in walking, not elsewhere classified (R26.2)     Time: 0737-1062 PT Time Calculation (min) (ACUTE ONLY): 25 min  Charges:  $Therapeutic Exercise: 8-22 mins $Therapeutic Activity: 8-22 mins                     Ferd Glassing., PT  Office # [phone]    Ilda Foil 02/03/2023, 10:01 AM

## 2023-02-04 ENCOUNTER — Ambulatory Visit: Payer: 59 | Admitting: Physical Therapy

## 2023-02-04 ENCOUNTER — Ambulatory Visit: Payer: 59 | Admitting: Occupational Therapy

## 2023-02-04 DIAGNOSIS — G6181 Chronic inflammatory demyelinating polyneuritis: Secondary | ICD-10-CM | POA: Diagnosis not present

## 2023-02-04 LAB — CREATININE, SERUM
Creatinine, Ser: 1.13 mg/dL — ABNORMAL HIGH (ref 0.44–1.00)
GFR, Estimated: 57 mL/min — ABNORMAL LOW (ref >60)

## 2023-02-04 NOTE — Progress Notes (Signed)
Occupational Therapy Treatment Patient Details Name: Dana [name] MRN: [id number] DOB: 1966-05-22 Today's Date: 02/04/2023   History of present illness Patient is a 57 year old female female initially diagnosed with acute demyelinating neuropathy in January and treated with IVIG with no clinical change. She was sent from neurology clinic for evaluation of demyelination.  Admitted for PLEX.   OT comments  Pt making steady progress. Appears stronger than on evaluation wit BL/UE. Focus of session on addressing ADL tasks as well as proximal strengthening of B shoulder girdles. CM notified of need for outpt OT as well as wide drop arm BSC and wife RW. Will continue to follow.    Recommendations for follow up therapy are one component of a multi-disciplinary discharge planning process, led by the attending physician.  Recommendations may be updated based on patient status, additional functional criteria and insurance authorization.    Assistance Recommended at Discharge Frequent or constant Supervision/Assistance  Patient can return home with the following  A little help with walking and/or transfers;A little help with bathing/dressing/bathroom;Assistance with cooking/housework;Assist for transportation   Equipment Recommendations  None recommended by OT    Recommendations for Other Services      Precautions / Restrictions Precautions Precautions: Fall Restrictions Weight Bearing Restrictions: No       Mobility Bed Mobility Overal bed mobility: Modified Independent Bed Mobility: Supine to Sit     Supine to sit: Modified independent (Device/Increase time), HOB elevated     General bed mobility comments: Increased time, use of rail    Transfers Overall transfer level: Needs assistance Equipment used: Rolling walker (2 wheels) Transfers: Sit to/from Stand Sit to Stand: Min assist           General transfer comment: increased time, assist to power up     Balance Overall  balance assessment: Needs assistance Sitting-balance support: Feet supported, No upper extremity supported Sitting balance-Leahy Scale: Good     Standing balance support: Bilateral upper extremity supported, Reliant on assistive device for balance, During functional activity Standing balance-Leahy Scale: Poor Standing balance comment: relying on rolling walker for support in standing. standing tolerance ~ 45 seconds performed x 3 bouts                           ADL either performed or assessed with clinical judgement   ADL Overall ADL's : Needs assistance/impaired                                     Functional mobility during ADLs: Minimal assistance General ADL Comments: overall set up for UB ADL an min  guard with LB from sit -stand; can complete @ bed level with set up; disucssed use of wide drop arm BSC    Extremity/Trunk Assessment Upper Extremity Assessment LUE Deficits / Details: overall weaker than RUE but functional; limited ER B shoulders to neutral; Diffiuclty reaching behind head with B hands; - capsular tightness; weak scapula B            Vision       Perception     Praxis      Cognition Arousal/Alertness: Awake/alert Behavior During Therapy: WFL for tasks assessed/performed Overall Cognitive Status: Within Functional Limits for tasks assessed  Exercises Exercises: General Upper Extremity Other Exercises Other Exercises: scapular elevation depression, retraction and protraction x 10 each direction Other Exercises: D1 diagonals in supine Other Exercises: pull back rows with level 2 T band x 10 Other Exercises: issued level 3 theraputty for grip/pinch strengthening    Shoulder Instructions       General Comments      Pertinent Vitals/ Pain       Pain Assessment Pain Assessment: Faces Faces Pain Scale: Hurts little more Pain Location: R shoulder with IR Pain  Descriptors / Indicators: Discomfort, Sharp, Grimacing, Guarding Pain Intervention(s): Limited activity within patient's tolerance  Home Living                                          Prior Functioning/Environment              Frequency  Min 2X/week        Progress Toward Goals  OT Goals(current goals can now be found in the care plan section)  Progress towards OT goals: Progressing toward goals  Acute Rehab OT Goals Patient Stated Goal: to get stronger OT Goal Formulation: With patient Time For Goal Achievement: 02/12/23 Potential to Achieve Goals: Good ADL Goals Pt Will Perform Lower Body Bathing: with set-up;bed level Pt Will Transfer to Toilet: with min guard assist;bedside commode;squat pivot transfer Pt/caregiver will Perform Home Exercise Program: Both right and left upper extremity;Independently;With theraband;With theraputty  Plan Discharge plan remains appropriate    Co-evaluation                 AM-PAC OT "6 Clicks" Daily Activity     Outcome Measure   Help from another person eating meals?: None Help from another person taking care of personal grooming?: A Little Help from another person toileting, which includes using toliet, bedpan, or urinal?: A Little Help from another person bathing (including washing, rinsing, drying)?: A Little Help from another person to put on and taking off regular upper body clothing?: A Little Help from another person to put on and taking off regular lower body clothing?: A Little 6 Click Score: 19    End of Session Equipment Utilized During Treatment: Rolling walker (2 wheels)  OT Visit Diagnosis: Other abnormalities of gait and mobility (R26.89);Muscle weakness (generalized) (M62.81);Pain Pain - Right/Left: Right Pain - part of body: Shoulder (all over)   Activity Tolerance     Patient Left with call bell/phone within reach;in bed;with family/visitor present   Nurse Communication Mobility  status (IV needs attention)        Time: 1400-1440 OT Time Calculation (min): 40 min  Charges: OT General Charges $OT Visit: 1 Visit OT Treatments $Self Care/Home Management : 8-22 mins $Neuromuscular Re-education: 23-37 mins  Luisa Dago, OT/L   Acute OT Clinical Specialist Acute Rehabilitation Services Pager [phone] Office [phone]   [facility] 02/04/2023, 2:52 PM

## 2023-02-04 NOTE — Progress Notes (Signed)
Neurology Progress Note  Brief HPI: 57 year old patient with history of B12 deficiency, anxiety, back pain, right breast mass, chronic opioid use, DJD, fibromyalgia, hyperlipidemia, hypertension, obesity and tobacco use was originally admitted in January for GBS.  She presented 2 days ago from her neurology office with nerve conduction study that showed evidence of demyelinating polyradiculoneuropathy.  She is currently undergoing PLEX per Dr. Zannie Cove recommendation which we concur with.  Subjective: Patient is feeling generally stronger than yesterday and is in good spirits. The tightness she reported in her L quad yesterday is significantly improved.  Exam: Vitals:   02/04/23 0731 02/04/23 1513  BP: 138/78 (!) 154/79  Pulse: 89 80  Resp: 18 18  Temp: 98.9 F (37.2 C) 98.3 F (36.8 C)  SpO2: 96% 100%   Gen: In bed, NAD Resp: non-labored breathing, no acute distress Abd: soft, nt  Neuro: Mental Status: Alert and oriented to person, place, time and situation, able to follow simple and complex commands Cranial Nerves: Pupils equal and reactive, extraocular movements intact, facial sensation symmetrical, face symmetrical, phonation normal, hearing intact to voice, shoulder shrug symmetrical, tongue midline Motor: 5/5 strength in bilateral upper extremities, proximal and distal, 4 -/5 strength in left lower extremity at hip flexor, 4/5 strength at hip flexor and right lower extremity, 4/5 strength at the bilateral knee flexion and extension, 4/5 strength with dorsiflexion and plantarflexion bilaterally Sensory: Intact to light touch and symmetrical except for a patch of numbness on the left thigh, subjective "tight" feeling in hands and feet. No clear loss of temp sensation in a length dependent manner. Does endorse colder temperature in the face compared to the arms/legs DTR: Unable to elicit Gait: Deferred  Pertinent Labs:    Latest Ref Rng & Units 02/03/2023   10:53 AM 02/01/2023    4:49  AM 01/31/2023   11:53 AM  CBC  WBC 4.0 - 10.5 K/uL 11.9  13.3  13.7   Hemoglobin 12.0 - 15.0 g/dL 54.0  98.1  19.1   Hematocrit 36.0 - 46.0 % 40.7  40.6  42.4   Platelets 150 - 400 K/uL 166  177  199        Latest Ref Rng & Units 02/04/2023    7:17 AM 02/03/2023    5:33 PM 02/01/2023    4:49 AM  BMP  Glucose 70 - 99 mg/dL  478  295   BUN 6 - 20 mg/dL  19  7   Creatinine 6.21 - 1.00 mg/dL 3.08  6.57  8.46   Sodium 135 - 145 mmol/L  137  139   Potassium 3.5 - 5.1 mmol/L  3.6  3.7   Chloride 98 - 111 mmol/L  102  109   CO2 22 - 32 mmol/L  25  22   Calcium 8.9 - 10.3 mg/dL  8.6  8.3      Imaging Reviewed:  No imaging this admission  Assessment: 57 year old patient with history of Guillain-Barr and also as above presented for worsening weakness and nerve conduction study consistent with demyelinating polyradiculoneuropathy, likely CIDP.  Patient began PLEX 5/16 and has had some improvement.   Impression: Progressive acute demyelinating polyradiculoneuropathy  Recommendations: 1) PLEX started 5/16   Session 2- 5/17  Session 3- 5/20  Session 4 anticipated 5/22 Will need total of five sessions 2) PT/OT 3) neurology will continue to follow  Bing Neighbors, MD [facility] [phone]  If 7pm- 7am, please page neurology on call as listed in AMION.

## 2023-02-04 NOTE — TOC Progression Note (Signed)
Transition of Care [facility]) - Progression Note    Patient Details  Name: Dana [name] MRN: [id number] Date of Birth: 17-Dec-1965  [facility]) CM/SW Contact  Janae Bridgeman, RN Phone Number: 02/04/2023, 10:32 AM  Clinical Narrative:    CM is continuing to follow the patient for [facility] needs - 3 more sessions of plasmapheresis planned Monday Wednesday Friday next week then discharge per attending MD note.  Referral placed for Outpatient PT follow up with patient's current clinic at [facility].  Patient will discharge back home with family once medically stable for discharge.   Expected Discharge Plan: OP Rehab Barriers to Discharge: Continued Medical Work up  Expected Discharge Plan and Services   Discharge Planning Services: CM Consult Post Acute Care Choice: Resumption of Svcs/PTA Provider Living arrangements for the past 2 months: Single Family Home                                       Social Determinants of Health (SDOH) Interventions SDOH Screenings   Food Insecurity: No Food Insecurity (01/28/2023)  Housing: Low Risk  (01/28/2023)  Transportation Needs: No Transportation Needs (01/28/2023)  Utilities: Not At Risk (01/28/2023)  Depression (PHQ2-9): Medium Risk (12/30/2022)  Financial Resource Strain: Low Risk  (05/06/2022)  Physical Activity: Inactive (05/06/2022)  Stress: No Stress Concern Present (05/06/2022)  Tobacco Use: Medium Risk (01/31/2023)    Readmission Risk Interventions    01/29/2023    2:51 PM  Readmission Risk Prevention Plan  Post Dischage Appt Complete  Medication Screening Complete  Transportation Screening Complete

## 2023-02-04 NOTE — Progress Notes (Signed)
  Progress Note   [name] ZOX:[id number] DOB: 01-30-1966 DOA: 01/28/2023     7 DOS: the patient was seen and examined on 02/04/2023 at 10:59 AM      Brief hospital course: Dana [name] is a 57 y.o. F with obesity, chronic pain/DJD, and HTN as well as recent diagnosis "Dana [name]" sent from Neurology clinic for persistent demyelinating polyradiculoneuropathy.   5/14: Admitted from Neuro clinic for PLEX 5/15: Central line placed 5/16: First plasmapharesis treatment 5/17: Plasmapheresis #2 5/20: Plasmapheresis #3     Assessment and Plan: * CIDP (chronic inflammatory demyelinating polyneuropathy) (HCC) Iron studies unremarkable, ANA, SSA/B-.  SPEP unremarkable.  Has had mild oozing from tunneled line site, remains functional.  If recurrent bleeding is a problem, see IR note from 5/20. - Consult neurology for plasmapheresis, appreciate expertise  - 2 more sessions of plasmapheresis planned tomorrow and Friday then discharge   Osteoarthritis of bilateral knees States her knee pain is flaring up, and she is due for a knee injection.  I messaged [employer], who has graciously agreed to see the patient later this week for possible steroid injection, if indicated. - Appreciate ortho attention   Wheelchair dependence    Spinal stenosis of lumbar region with neurogenic claudication - Continue home regimen of hydromorphone, Flexeril, gabapentin  Hypertension Blood pressure normal - Continue metoprolol, Lasix  Obesity, Class III, BMI 40-49.9 (morbid obesity) (HCC) BMI 49.95         Subjective: No complaints, no new nursing concerns.    Physical Exam: BP 138/78 (BP Location: Left Arm)   Pulse 89   Temp 98.9 F (37.2 C)   Resp 18   Ht 5\' 4"  (1.626 m)   Wt 128.1 kg   LMP 08/17/2012   SpO2 96%   BMI 48.48 kg/m   Adult female, sitting on the edge of the bed, interactive and appropriate, pleasant, no acute distress, RRR, respirations normal, no  peripheral edema, she has generalized weakness, proximal muscle weakness, but attention normal, affect pleasant, judgment insight normal.  Speech fluent, face symmetric.      Data Reviewed: CBC unremarkable Basic metabolic panel shows stable renal function   Family Communication: Husband at the bedside    Disposition: Status is: Inpatient Plan for plasmapheresis for 5 sessions, fourth session will be tomorrow, last session Friday, then home Friday afternoon         Author: Alberteen Sam, MD 02/04/2023 3:08 PM  For on call review [email or web address].

## 2023-02-05 DIAGNOSIS — G6181 Chronic inflammatory demyelinating polyneuritis: Secondary | ICD-10-CM | POA: Diagnosis not present

## 2023-02-05 LAB — CBC WITH DIFFERENTIAL/PLATELET
Abs Immature Granulocytes: 0.07 10*3/uL (ref 0.00–0.07)
Basophils Absolute: 0.1 10*3/uL (ref 0.0–0.1)
Basophils Relative: 0 %
Eosinophils Absolute: 0.1 10*3/uL (ref 0.0–0.5)
Eosinophils Relative: 1 %
HCT: 43.7 % (ref 36.0–46.0)
Hemoglobin: 13.9 g/dL (ref 12.0–15.0)
Immature Granulocytes: 0 %
Lymphocytes Relative: 8 %
Lymphs Abs: 1.3 10*3/uL (ref 0.7–4.0)
MCH: 27.5 pg (ref 26.0–34.0)
MCHC: 31.8 g/dL (ref 30.0–36.0)
MCV: 86.4 fL (ref 80.0–100.0)
Monocytes Absolute: 0.7 10*3/uL (ref 0.1–1.0)
Monocytes Relative: 4 %
Neutro Abs: 13.9 10*3/uL — ABNORMAL HIGH (ref 1.7–7.7)
Neutrophils Relative %: 87 %
Platelets: 186 10*3/uL (ref 150–400)
RBC: 5.06 MIL/uL (ref 3.87–5.11)
RDW: 14.4 % (ref 11.5–15.5)
WBC: 16 10*3/uL — ABNORMAL HIGH (ref 4.0–10.5)
nRBC: 0 % (ref 0.0–0.2)

## 2023-02-05 MED ORDER — SODIUM CHLORIDE 0.9 % IV SOLN: Freq: Once | INTRAVENOUS | Status: DC

## 2023-02-05 MED ORDER — HEPARIN SODIUM (PORCINE) 1000 UNIT/ML IJ SOLN
1000.0000 [IU] | Freq: Once | INTRAMUSCULAR | Status: AC
  Administered 2023-02-05: 1000 [IU]
  Filled 2023-02-05: qty 1

## 2023-02-05 MED ORDER — ACETAMINOPHEN 325 MG PO TABS: 650.0000 mg | ORAL_TABLET | ORAL | Status: DC | PRN

## 2023-02-05 MED ORDER — CALCIUM CARBONATE ANTACID 500 MG PO CHEW
2.0000 | CHEWABLE_TABLET | ORAL | Status: DC
  Administered 2023-02-05: 400 mg via ORAL

## 2023-02-05 MED ORDER — CALCIUM GLUCONATE-NACL 2-0.675 GM/100ML-% IV SOLN
2.0000 g | Freq: Once | INTRAVENOUS | Status: AC
  Administered 2023-02-05: 2000 mg via INTRAVENOUS
  Filled 2023-02-05: qty 100

## 2023-02-05 MED ORDER — SODIUM CHLORIDE 0.9 % IV SOLN
INTRAVENOUS | Status: AC
  Filled 2023-02-05 (×4): qty 200

## 2023-02-05 MED ORDER — DIPHENHYDRAMINE HCL 25 MG PO CAPS
25.0000 mg | ORAL_CAPSULE | Freq: Four times a day (QID) | ORAL | Status: DC | PRN
  Administered 2023-02-05: 25 mg via ORAL

## 2023-02-05 MED ORDER — ACD FORMULA A 0.73-2.45-2.2 GM/100ML VI SOLN
1000.0000 mL | Status: DC
  Administered 2023-02-05: 1000 mL
  Filled 2023-02-05 (×2): qty 1000

## 2023-02-05 MED ORDER — CALCIUM CARBONATE ANTACID 500 MG PO CHEW
2.0000 | CHEWABLE_TABLET | ORAL | Status: DC
  Filled 2023-02-05: qty 2

## 2023-02-05 NOTE — Progress Notes (Signed)
Consulted to evaluate dsg on R chest HDC. No bleeding at this time. Will monitor.

## 2023-02-05 NOTE — Progress Notes (Signed)
   02/05/23 2210  Vitals  Temp (!) 97 F (36.1 C)  Temp Source Oral  Pulse Rate 95  ECG Heart Rate 95  Resp 15  BP 121/75  Oxygen Therapy  SpO2 94 %  O2 Device Room Air  Pain Assessment  Pain Scale 0-10  Pain Score Asleep  Apheresis   Procedure Comments Tx. completed without difficulties. VSS, report call to 2W Bedside RN Chynajah Oneil.  Post-apheresis  Net Removed (mL) 4063 mL  Replacement (mL) 3095 mL  ACDA infused (mL) 920 mL  Total Normal Saline Administered 160 mL  Total Calcium Administered 100 mL  Tolerated Procedure Yes  Post-Procedure Comments Pt tx #4 is done and tx # 5 is Friday the 24th and pt. is aware.  Hemodialysis Catheter Right Internal jugular Double lumen Permanent (Tunneled)  Placement Date/Time: 01/29/23 0951   Serial / Lot #: [id number], Jonny Ruiz  Expiration Date: 10/06/26  Time Out: Correct patient;Correct site;Correct procedure  Maximum sterile barrier precautions: Hand hygiene;Cap;Mask;Sterile gown;Sterile gloves;Large sterile...  Site Condition No complications  Blue Lumen Status Flushed;Heparin locked;Dead end cap in place;Blood return noted  Red Lumen Status Flushed;Dead end cap in place;Heparin locked;Blood return noted  Purple Lumen Status N/A  Catheter fill solution Heparin 1000 units/ml  Catheter fill volume (Arterial) 1.9 cc  Catheter fill volume (Venous) 1.9  Dressing Type Transparent  Dressing Status Clean, Dry, Intact  Drainage Description None  Dressing Change Due 02/07/23   Received patient in bed to unit.  Alert and oriented.  Informed consent signed and in chart.   TX duration:  Patient tolerated well.  Transported back to the room  Alert, without acute distress.  Hand-off given to patient's nurse.   Access used: Yes Access issues: No  Total UF removed: Plasmapheresis Tx Medication(s) given: See MAR Post HD VS: See Above Grid Post HD weight: none   [facility]

## 2023-02-05 NOTE — Plan of Care (Signed)

## 2023-02-05 NOTE — Progress Notes (Signed)
Physical Therapy Treatment Patient Details Name: Dana [name] MRN: [id number] DOB: [birth year] Today's Date: 02/05/2023   History of Present Illness Patient is a 57 year old female female initially diagnosed with acute demyelinating neuropathy in January and treated with IVIG with no clinical change. She was sent from neurology clinic for evaluation of demyelination.  Admitted for PLEX.    PT Comments    PT session focused on LE exercises supine, seated, and standing. Standing tolerance limited by R knee pain. Pt demo mod I bed mobility. Min assist needed to power up to stand. Pt declining OOB due to recliner not being comfortable. Pt supine in bed at end of session. Ice applied R knee.    Recommendations for follow up therapy are one component of a multi-disciplinary discharge planning process, led by the attending physician.  Recommendations may be updated based on patient status, additional functional criteria and insurance authorization.  Follow Up Recommendations       Assistance Recommended at Discharge Frequent or constant Supervision/Assistance  Patient can return home with the following Assistance with cooking/housework;Help with stairs or ramp for entrance;Assist for transportation;A little help with walking and/or transfers;A little help with bathing/dressing/bathroom   Equipment Recommendations  Rolling walker (2 wheels)    Recommendations for Other Services       Precautions / Restrictions Precautions Precautions: Fall Restrictions Weight Bearing Restrictions: No     Mobility  Bed Mobility Overal bed mobility: Modified Independent                  Transfers Overall transfer level: Needs assistance Equipment used: Rolling walker (2 wheels) Transfers: Sit to/from Stand Sit to Stand: Min assist           General transfer comment: increased time, assist to power up    Ambulation/Gait                   Stairs              Wheelchair Mobility    Modified Rankin (Stroke Patients Only)       Balance Overall balance assessment: Needs assistance Sitting-balance support: Feet supported, No upper extremity supported Sitting balance-Leahy Scale: Good     Standing balance support: Bilateral upper extremity supported, Reliant on assistive device for balance, During functional activity Standing balance-Leahy Scale: Poor                              Cognition Arousal/Alertness: Awake/alert Behavior During Therapy: WFL for tasks assessed/performed Overall Cognitive Status: Within Functional Limits for tasks assessed                                          Exercises General Exercises - Lower Extremity Ankle Circles/Pumps: AROM, Both, 10 reps, Supine Quad Sets: AROM, Both, 10 reps, Supine Gluteal Sets: AROM, Both, 10 reps, Supine Long Arc Quad: AROM, Right, Left, 10 reps, Seated Heel Slides: AROM, Right, Left, 10 reps, Supine Hip ABduction/ADduction: AROM, Both, 10 reps, Supine Hip Flexion/Marching: AROM, Right, Left, 10 reps, Seated Heel Raises: AROM, Both, Standing, 5 reps Mini-Sqauts: AROM, Both, 5 reps, Standing    General Comments        Pertinent Vitals/Pain Pain Assessment Pain Assessment: Faces Faces Pain Scale: Hurts whole lot Pain Location: R knee Pain Descriptors / Indicators: Discomfort, Grimacing, Guarding Pain Intervention(s):  Monitored during session, Repositioned, Limited activity within patient's tolerance, Ice applied    Home Living                          Prior Function            PT Goals (current goals can now be found in the care plan section) Acute Rehab PT Goals Patient Stated Goal: to return to outpatient PT at [facility] location Progress towards PT goals: Progressing toward goals    Frequency    Min 3X/week      PT Plan Equipment recommendations need to be updated    Co-evaluation               AM-PAC PT "6 Clicks" Mobility   Outcome Measure  Help needed turning from your back to your side while in a flat bed without using bedrails?: None Help needed moving from lying on your back to sitting on the side of a flat bed without using bedrails?: None Help needed moving to and from a bed to a chair (including a wheelchair)?: A Little Help needed standing up from a chair using your arms (e.g., wheelchair or bedside chair)?: A Little Help needed to walk in hospital room?: Total Help needed climbing 3-5 steps with a railing? : Total 6 Click Score: 16    End of Session Equipment Utilized During Treatment: Gait belt Activity Tolerance: Patient tolerated treatment well Patient left: in bed;with call bell/phone within reach Nurse Communication: Mobility status PT Visit Diagnosis: Muscle weakness (generalized) (M62.81);Difficulty in walking, not elsewhere classified (R26.2);Pain Pain - Right/Left: Right Pain - part of body: Knee     Time: 0912-0938 PT Time Calculation (min) (ACUTE ONLY): 26 min  Charges:  $Therapeutic Exercise: 8-22 mins $Therapeutic Activity: 8-22 mins                     Ferd Glassing., PT  Office # [phone]    Ilda Foil 02/05/2023, 11:02 AM

## 2023-02-05 NOTE — Progress Notes (Signed)
Patient seen and evaluated.  She has history of bilateral knee osteoarthritis.  She has had serial cortisone injections in our office with good relief.  Has not been seen in several months.  She denies any new injury to her knees.  Currently hospitalized and receiving plasmapheresis sessions with next session scheduled for this evening after 6:00.  She denies any fevers or chills.  She states that the pain is diffusely throughout the knee with no significant change compared with the quality of her typical knee pain.  She would like to try cortisone injection which has historically given her good relief for several months.  5 cc of lidocaine was used for local anesthesia.  5 to 10 minutes after administration of lidocaine, a combination of 4 cc bupivacaine mixed with 40 mg of Depo-Medrol was administered and patient tolerated procedure well.  No complications.  Prior to any injection, alcohol and Betadine was utilized in order to prep the skin.  Band-Aid applied.  Only the right knee was injected currently and we will plan to inject the left knee either later today or tomorrow in between surgical procedures.

## 2023-02-05 NOTE — Progress Notes (Signed)
PROGRESS NOTE  Dana [name]  WUJ:[id number] DOB: 10/30/1965 DOA: 01/28/2023 PCP: Nelwyn Salisbury, MD   Brief Narrative: Patient is a 57 year old female with history of morbid obesity, chronic pain syndrome/degenerative disc disease, hypertension, recent diagnosis of [name] syndrome who was sent from neurology clinic for plasmapheresis for progressive demyelinating polyradiculoneuropathy.  Currently on plasmapheresis, plan for 5 sessions, the last session will be on Friday.  Neurology following  Assessment & Plan:  Principal Problem:   CIDP (chronic inflammatory demyelinating polyneuropathy) (HCC) Active Problems:   Obesity, Class III, BMI 40-49.9 (morbid obesity) (HCC)   HTN (hypertension)   Spinal stenosis of lumbar region with neurogenic claudication   Wheelchair dependence   Progressive  demyelinating polyradiculoneuropathy: Sent from neurology clinic.  Presented with worsening weakness.  Nerve conduction study consistent  with demyelinating polyradiculoneuropathy, likely CIDP.  Plex started on 5/16, now with  improvement.  Neurology closely following.  Plan for 5 sessions, last session will be on Friday. PT/OT following.  Osteoarthritis of bilateral knees: Complains of bilateral knee pain and is due for injection of steroid.  We [employer], discussed with PA Harriette Bouillon, who has agreed to see the patient for possible steroid injection. Patient is wheelchair dependent.  PT recommended outpatient follow-up  Spinal stenosis of lumbar region with neurogenic claudication: On Dilaudid, Flexeril, gabapentin  Hypertension: Currently blood pressure stable.  Continue metoprolol, Lasix  Morbid obesity: BMI of 48.8           DVT prophylaxis:     Code Status: Full Code  Family Communication: Discussed with husband on phone from bedside  Patient status:Lovenox  Patient is from :Home  Anticipated discharge NF:AOZH  Estimated DC date: After completion of  5 sessions of of plasmapheresis   Consultants: Neurology  Procedures: Plasmapheresis  Antimicrobials:  Anti-infectives (From admission, onward)    Start     Dose/Rate Route Frequency Ordered Stop   01/29/23 0945  ceFAZolin (ANCEF) IVPB 2g/100 mL premix        2 g 200 mL/hr over 30 Minutes Intravenous  Once 01/29/23 0853 01/29/23 1010       Subjective: Patient seen and examined at the bedside today.  Appears very comfortable, sitting at the edge of the bed.  Able to move her both upper extremities without any problem.  Can walk up to the bedside commode.  She thinks that her weakness has improved.  Complains of some discomfort around the  central line area  Objective: Vitals:   02/04/23 1513 02/04/23 1929 02/05/23 0512 02/05/23 0802  BP: (!) 154/79 (!) 138/93 122/68 138/88  Pulse: 80 96 75 77  Resp: 18 18 18 18   Temp: 98.3 F (36.8 C) (!) 97.5 F (36.4 C) 98.6 F (37 C) 98.3 F (36.8 C)  TempSrc: Oral     SpO2: 100% 99% 100% 95%  Weight:      Height:        Intake/Output Summary (Last 24 hours) at 02/05/2023 1005 Last data filed at 02/04/2023 1128 Gross per 24 hour  Intake 240 ml  Output --  Net 240 ml   Filed Weights   02/03/23 1917 02/03/23 2030 02/04/23 0500  Weight: 128 kg 128 kg 128.1 kg    Examination:  General exam: Overall comfortable, not in distress, morbidly obese HEENT: PERRL Respiratory system:  no wheezes or crackles  Cardiovascular system: S1 & S2 heard, RRR.  Central line Gastrointestinal system: Abdomen is nondistended, soft and nontender. Central nervous system: Alert and oriented,  generalized weakness Extremities: No edema, no clubbing ,no cyanosis Skin: No rashes, no ulcers,no icterus     Data Reviewed: I have personally reviewed following labs and imaging studies  CBC: Recent Labs  Lab 01/29/23 1130 01/30/23 1424 01/31/23 1153 02/01/23 0449 02/03/23 1053  WBC 7.0 10.9* 13.7* 13.3* 11.9*  NEUTROABS  --  6.9 9.8*  --  8.4*  HGB  12.6 13.0 13.4 12.9 13.2  HCT 40.5 40.7 42.4 40.6 40.7  MCV 89.8 87.3 87.6 88.5 86.0  PLT 256 235 199 177 166   Basic Metabolic Panel: Recent Labs  Lab 01/29/23 1130 02/01/23 0449 02/03/23 1733 02/04/23 0717  NA 139 139 137  --   K 3.8 3.7 3.6  --   CL 104 109 102  --   CO2 24 22 25   --   GLUCOSE 133* 184* 135*  --   BUN 9 7 19   --   CREATININE 1.07* 0.99 1.20* 1.13*  CALCIUM 8.7* 8.3* 8.6*  --      No results found for this or any previous visit (from the past 240 hour(s)).   Radiology Studies: No results found.  Scheduled Meds:  Chlorhexidine Gluconate Cloth  6 each Topical Daily   dorzolamide-timolol  1 drop Both Eyes BID   enoxaparin (LOVENOX) injection  60 mg Subcutaneous Q24H   furosemide  20 mg Oral BID   gabapentin  900 mg Oral TID   latanoprost  1 drop Both Eyes QHS   metoprolol tartrate  50 mg Oral BID   pantoprazole  40 mg Oral Daily   sodium chloride flush  3 mL Intravenous Q12H   Continuous Infusions:  citrate dextrose       LOS: 8 days   Burnadette Pop, MD [facility] P5/22/2024, 10:05 AM

## 2023-02-05 NOTE — TOC Progression Note (Addendum)
Transition of Care [facility]) - Progression Note    Patient Details  Name: Dana [name] MRN: [id number] Date of Birth: 04/16/1966  [facility] of [facility]) CM/SW Contact  Janae Bridgeman, RN Phone Number: 02/05/2023, 11:41 AM  Clinical Narrative:    CM met with the patient at the bedside to arranged delivery of DME to the home including drop arm BSC and Bariatric RW.  Patient states that she prefers [employer].  I called [employer] and spoke with Zackary, CM and ordered [facility] and RW to be delivered to the home before Friday of this week.  Patient will be discharged home once Medically stable for discharge.  Patient will need transport home by [facility] when medically stable.  02/05/23-  I called and spoke with [employer] and they are unable to provide the RW and bedside commode.  I spoke with the patient and [employer] was called to see if they can provide the rolling walker.  Patient states that she does not need the drop arm BSC and that they have one and she will get a new bucket for the equipment off [employer].   Patient did not want to pay out of pocket for the drop arm and supposedly patient receives one from [employer] in the past.  [employer] is checking on RW availability.   Expected Discharge Plan: OP Rehab Barriers to Discharge: Continued Medical Work up  Expected Discharge Plan and Services   Discharge Planning Services: CM Consult Post Acute Care Choice: Resumption of Svcs/PTA Provider Living arrangements for the past 2 months: Single Family Home                                       Social Determinants of Health (SDOH) Interventions SDOH Screenings   Food Insecurity: No Food Insecurity (01/28/2023)  Housing: Low Risk  (01/28/2023)  Transportation Needs: No Transportation Needs (01/28/2023)  Utilities: Not At Risk (01/28/2023)  Depression (PHQ2-9): Medium Risk (12/30/2022)  Financial Resource Strain: Low Risk  (05/06/2022)  Physical Activity: Inactive (05/06/2022)  Stress: No Stress  Concern Present (05/06/2022)  Tobacco Use: Medium Risk (01/31/2023)    Readmission Risk Interventions    01/29/2023    2:51 PM  Readmission Risk Prevention Plan  Post Dischage Appt Complete  Medication Screening Complete  Transportation Screening Complete

## 2023-02-06 ENCOUNTER — Ambulatory Visit: Payer: 59 | Admitting: Physical Therapy

## 2023-02-06 DIAGNOSIS — G6181 Chronic inflammatory demyelinating polyneuritis: Secondary | ICD-10-CM | POA: Diagnosis not present

## 2023-02-06 NOTE — Progress Notes (Signed)
Patient reevaluated.  She has continued left knee pain from left knee osteoarthritis.  Right knee is doing better after injection yesterday.  Plan left knee injection today.  Left knee was prepped with combination of alcohol and DuraPrep.  5 cc of lidocaine was administered without complaint.  Shortly thereafter, combination of 4 cc of bupivacaine mixed with 40 mg of Depo-Medrol was administered without complication.  Patient tolerated procedure well.  Band-Aid placed as dressing.  She may follow-up in our office as needed.

## 2023-02-06 NOTE — Progress Notes (Signed)
Neurology Progress Note  Brief HPI: 57 year old patient with history of B12 deficiency, anxiety, back pain, right breast mass, chronic opioid use, DJD, fibromyalgia, hyperlipidemia, hypertension, obesity and tobacco use was originally admitted in January for GBS.  She presented 2 days ago from her neurology office with nerve conduction study that showed evidence of demyelinating polyradiculoneuropathy.  She is currently undergoing PLEX per Dr. Zannie Cove recommendation which we concur with.  Subjective: Feeling well, subjectively improving each day. No new complaints today. Tightness in leg has resolved. She is in good spirits.  Exam: Vitals:   02/05/23 2210 02/06/23 0700  BP: 121/75 (!) 145/83  Pulse: 95 88  Resp: 15   Temp: (!) 97 F (36.1 C) 97.7 F (36.5 C)  SpO2: 94% 99%   Gen: In bed, NAD Resp: non-labored breathing, no acute distress Abd: soft, nt  Neuro: Mental Status: Alert and oriented to person, place, time and situation, able to follow simple and complex commands Cranial Nerves: Pupils equal and reactive, extraocular movements intact, facial sensation symmetrical, face symmetrical, phonation normal, hearing intact to voice, shoulder shrug symmetrical, tongue midline Motor: 5/5 strength in bilateral upper extremities, proximal and distal, 4/5 strength in left lower extremity at hip flexor, 4/5 strength at hip flexor and right lower extremity, 4/5 strength at the bilateral knee flexion and extension, 4/5 strength with dorsiflexion and plantarflexion bilaterally Sensory: Intact to light touch and symmetrical except for a patch of numbness on the left thigh, subjective "tight" feeling in hands and feet. No clear loss of temp sensation in a length dependent manner. Does endorse colder temperature in the face compared to the arms/legs DTR: Unable to elicit Gait: Deferred  Pertinent Labs:    Latest Ref Rng & Units 02/05/2023    3:35 PM 02/03/2023   10:53 AM 02/01/2023    4:49 AM  CBC   WBC 4.0 - 10.5 K/uL 16.0  11.9  13.3   Hemoglobin 12.0 - 15.0 g/dL 16.1  09.6  04.5   Hematocrit 36.0 - 46.0 % 43.7  40.7  40.6   Platelets 150 - 400 K/uL 186  166  177        Latest Ref Rng & Units 02/04/2023    7:17 AM 02/03/2023    5:33 PM 02/01/2023    4:49 AM  BMP  Glucose 70 - 99 mg/dL  409  811   BUN 6 - 20 mg/dL  19  7   Creatinine 9.14 - 1.00 mg/dL 7.82  9.56  2.13   Sodium 135 - 145 mmol/L  137  139   Potassium 3.5 - 5.1 mmol/L  3.6  3.7   Chloride 98 - 111 mmol/L  102  109   CO2 22 - 32 mmol/L  25  22   Calcium 8.9 - 10.3 mg/dL  8.6  8.3      Imaging Reviewed:  No imaging this admission  Assessment: 57 year old patient with history of Guillain-Barr and also as above presented for worsening weakness and nerve conduction study consistent with demyelinating polyradiculoneuropathy, likely CIDP.  Patient began PLEX 5/16 and has had some improvement.   Impression: Progressive acute demyelinating polyradiculoneuropathy  Recommendations: 1) PLEX started 5/16   Session 2- 5/17  Session 3- 5/20  Session 4- 5/22  Session 5 (last session) anticipated 5/24 Patient may be discharged the following day on 5/25 2) PT/OT 3) neurology will continue to follow  Bing Neighbors, MD [facility] [phone]  If 7pm- 7am, please page neurology on call as listed  in AMION.

## 2023-02-06 NOTE — Progress Notes (Signed)
Occupational Therapy Treatment Patient Details Name: Dana [name] MRN: [id number] DOB: [birth year] Today's Date: 02/06/2023   History of present illness Patient is a 57 year old female female initially diagnosed with acute demyelinating neuropathy in January and treated with IVIG with no clinical change. She was sent from neurology clinic for evaluation of demyelination.  Admitted for PLEX.   OT comments  Patient seated on EOB upon arrival and states she has BUE shoulder soreness but willing to participate with HEP. Patient instructed in scapular exercises while seated on EOB with depression, retraction, and protraction. Patient provided handout for PNF patterns for BUE shoulders and performed seated with no resistance. Resistance exercises performed for horizontal abduction and elbow flexion. Level 3 therapy putty used for grip and pinch strengthening exercises. Patient demonstrated good understanding of HEP and states she has been performing hand exercises. Acute OT to continue to follow with discharge recommendations continue to be appropriate.    Recommendations for follow up therapy are one component of a multi-disciplinary discharge planning process, led by the attending physician.  Recommendations may be updated based on patient status, additional functional criteria and insurance authorization.    Assistance Recommended at Discharge Frequent or constant Supervision/Assistance  Patient can return home with the following  A little help with walking and/or transfers;A little help with bathing/dressing/bathroom;Assistance with cooking/housework;Assist for transportation   Equipment Recommendations  None recommended by OT    Recommendations for Other Services      Precautions / Restrictions Precautions Precautions: Fall Restrictions Weight Bearing Restrictions: No       Mobility Bed Mobility Overal bed mobility: Modified Independent             General bed mobility comments:  seated on EOB    Transfers Overall transfer level: Needs assistance                 General transfer comment: not addressed     Balance Overall balance assessment: Needs assistance Sitting-balance support: Feet supported, No upper extremity supported Sitting balance-Leahy Scale: Good                                     ADL either performed or assessed with clinical judgement   ADL Overall ADL's : Needs assistance/impaired                                       General ADL Comments: focused on BUE HEP    Extremity/Trunk Assessment              Vision       Perception     Praxis      Cognition Arousal/Alertness: Awake/alert Behavior During Therapy: WFL for tasks assessed/performed Overall Cognitive Status: Within Functional Limits for tasks assessed                                          Exercises Exercises: General Upper Extremity, Other exercises General Exercises - Upper Extremity Shoulder Horizontal ABduction: Strengthening, Both, 10 reps, Seated, Theraband Theraband Level (Shoulder Horizontal Abduction): Level 2 (Red) Elbow Flexion: Strengthening, Both, 10 reps, Supine, Theraband Theraband Level (Elbow Flexion): Level 2 (Red) Other Exercises Other Exercises: scapular elevation depression, retraction and protraction x 10 each  direction Other Exercises: D1 and D2 diagonals seated on EOB Other Exercises: grip and pinch strengthening performed with level 3 therapy putty    Shoulder Instructions       General Comments      Pertinent Vitals/ Pain       Pain Assessment Pain Assessment: Faces Faces Pain Scale: Hurts even more Pain Location: BUE shoulders and right knee Pain Descriptors / Indicators: Discomfort, Grimacing, Guarding, Sore Pain Intervention(s): Limited activity within patient's tolerance, Monitored during session, Premedicated before session  Home Living                                           Prior Functioning/Environment              Frequency  Min 2X/week        Progress Toward Goals  OT Goals(current goals can now be found in the care plan section)  Progress towards OT goals: Progressing toward goals  Acute Rehab OT Goals Patient Stated Goal: get stronger OT Goal Formulation: With patient Time For Goal Achievement: 02/12/23 Potential to Achieve Goals: Good ADL Goals Pt Will Perform Lower Body Bathing: with set-up;bed level Pt Will Transfer to Toilet: with min guard assist;bedside commode;squat pivot transfer Pt/caregiver will Perform Home Exercise Program: Both right and left upper extremity;Independently;With theraband;With theraputty  Plan Discharge plan remains appropriate    Co-evaluation                 AM-PAC OT "6 Clicks" Daily Activity     Outcome Measure   Help from another person eating meals?: None Help from another person taking care of personal grooming?: A Little Help from another person toileting, which includes using toliet, bedpan, or urinal?: A Little Help from another person bathing (including washing, rinsing, drying)?: A Little Help from another person to put on and taking off regular upper body clothing?: A Little Help from another person to put on and taking off regular lower body clothing?: A Little 6 Click Score: 19    End of Session    OT Visit Diagnosis: Other abnormalities of gait and mobility (R26.89);Muscle weakness (generalized) (M62.81);Pain Pain - Right/Left: Right Pain - part of body: Shoulder;Knee (both)   Activity Tolerance Patient tolerated treatment well   Patient Left in bed;with call bell/phone within reach   Nurse Communication Mobility status        Time: 1610-9604 OT Time Calculation (min): 38 min  Charges: OT General Charges $OT Visit: 1 Visit OT Treatments $Therapeutic Exercise: 38-52 mins  Alfonse Flavors, OTA Acute Rehabilitation Services  Office  [phone]   Dewain Penning 02/06/2023, 2:31 PM

## 2023-02-06 NOTE — Progress Notes (Addendum)
PROGRESS NOTE  Dana [name]  WRU:[id number] DOB: 04-01-1966 DOA: 01/28/2023 PCP: Nelwyn Salisbury, MD   Brief Narrative: Patient is a 57 year old female with history of morbid obesity, chronic pain syndrome/degenerative disc disease, hypertension, recent diagnosis of [name] syndrome who was sent from neurology clinic for plasmapheresis for progressive demyelinating polyradiculoneuropathy.  Currently on plasmapheresis, plan for 5 sessions, the last session will be on Friday.  Neurology following  Assessment & Plan:  Principal Problem:   CIDP (chronic inflammatory demyelinating polyneuropathy) (HCC) Active Problems:   Obesity, Class III, BMI 40-49.9 (morbid obesity) (HCC)   HTN (hypertension)   Spinal stenosis of lumbar region with neurogenic claudication   Wheelchair dependence   Progressive  demyelinating polyradiculoneuropathy: Sent from neurology clinic.  Presented with worsening weakness.  Nerve conduction study consistent  with demyelinating polyradiculoneuropathy, likely CIDP.  Plex started on 5/16, now with  improvement.  Neurology closely following.  Plan for 5 sessions, last session will be on Friday. PT/OT following.  Recommended outpatient follow-up  Osteoarthritis of bilateral knees: Complains of bilateral knee pain and is due for injection of steroid.  Orthopedics saw her here and administer Depo-Medrol on the right knee , also planning for left knee injection .patient is wheelchair dependent.  PT recommended outpatient follow-up  Spinal stenosis of lumbar region with neurogenic claudication, chronic pain syndrome: On Dilaudid, Flexeril, gabapentin  Hypertension: Currently blood pressure stable.  Continue metoprolol, Lasix  Morbid obesity: BMI of 48.8  Leukocytosis: This is most likely reactive or from the steroid injection.  Continue to monitor.  No signs of infectious process           DVT prophylaxis:Lovenox     Code Status: Full Code  Family  Communication: Discussed with husband on phone from bedside on 5/23  Patient status:Lovenox  Patient is from :Home  Anticipated discharge BJ:YNWG  Estimated DC date: After completion of 5 sessions of plasmapheresis. Last plasmapheresis  on Friday, she wants to go home Saturday morning   Consultants: Neurology  Procedures: Plasmapheresis  Antimicrobials:  Anti-infectives (From admission, onward)    Start     Dose/Rate Route Frequency Ordered Stop   01/29/23 0945  ceFAZolin (ANCEF) IVPB 2g/100 mL premix        2 g 200 mL/hr over 30 Minutes Intravenous  Once 01/29/23 0853 01/29/23 1010       Subjective: Patient seen and examined at bedside today.  Hemodynamically stable comfortable.  Lying in bed.  She had fourth session of plasmapheresis yesterday.  Also got injection of steroid on the knee. She thinks that her weakness has improved.  Still not able to go to bathroom but is able to go near the bedside commode  Objective: Vitals:   02/05/23 2046 02/05/23 2119 02/05/23 2210 02/06/23 0700  BP: (!) 148/86 129/74 121/75 (!) 145/83  Pulse: 93 96 95 88  Resp: 16 16 15    Temp:   (!) 97 F (36.1 C) 97.7 F (36.5 C)  TempSrc:   Oral Oral  SpO2: 97% 93% 94% 99%  Weight: 128.1 kg     Height:        Intake/Output Summary (Last 24 hours) at 02/06/2023 1029 Last data filed at 02/06/2023 9562 Gross per 24 hour  Intake 480 ml  Output 750 ml  Net -270 ml   Filed Weights   02/03/23 2030 02/04/23 0500 02/05/23 2046  Weight: 128 kg 128.1 kg 128.1 kg    Examination: General exam: Overall comfortable, not in distress, morbidly obese  HEENT: PERRL Respiratory system:  no wheezes or crackles  Cardiovascular system: S1 & S2 heard, RRR.  Central line Gastrointestinal system: Abdomen is nondistended, soft and nontender. Central nervous system: Alert and oriented, generalized weakness Extremities: No edema, no clubbing ,no cyanosis Skin: No rashes, no ulcers,no icterus     Data  Reviewed: I have personally reviewed following labs and imaging studies  CBC: Recent Labs  Lab 01/30/23 1424 01/31/23 1153 02/01/23 0449 02/03/23 1053 02/05/23 1535  WBC 10.9* 13.7* 13.3* 11.9* 16.0*  NEUTROABS 6.9 9.8*  --  8.4* 13.9*  HGB 13.0 13.4 12.9 13.2 13.9  HCT 40.7 42.4 40.6 40.7 43.7  MCV 87.3 87.6 88.5 86.0 86.4  PLT 235 199 177 166 186   Basic Metabolic Panel: Recent Labs  Lab 02/01/23 0449 02/03/23 1733 02/04/23 0717  NA 139 137  --   K 3.7 3.6  --   CL 109 102  --   CO2 22 25  --   GLUCOSE 184* 135*  --   BUN 7 19  --   CREATININE 0.99 1.20* 1.13*  CALCIUM 8.3* 8.6*  --      No results found for this or any previous visit (from the past 240 hour(s)).   Radiology Studies: No results found.  Scheduled Meds:  Chlorhexidine Gluconate Cloth  6 each Topical Daily   dorzolamide-timolol  1 drop Both Eyes BID   enoxaparin (LOVENOX) injection  60 mg Subcutaneous Q24H   furosemide  20 mg Oral BID   gabapentin  900 mg Oral TID   latanoprost  1 drop Both Eyes QHS   metoprolol tartrate  50 mg Oral BID   pantoprazole  40 mg Oral Daily   sodium chloride flush  3 mL Intravenous Q12H   Continuous Infusions:     LOS: 9 days   Burnadette Pop, MD [facility] P5/23/2024, 10:29 AM

## 2023-02-06 NOTE — Progress Notes (Signed)
Neurology Progress Note  Brief HPI: 57 year old patient with history of B12 deficiency, anxiety, back pain, right breast mass, chronic opioid use, DJD, fibromyalgia, hyperlipidemia, hypertension, obesity and tobacco use was originally admitted in January for GBS.  She presented 2 days ago from her neurology office with nerve conduction study that showed evidence of demyelinating polyradiculoneuropathy.  She is currently undergoing PLEX per Dr. Zannie Cove recommendation which we concur with.  Subjective: Feeling well, subjectively improving each day. No new complaints today.  Exam: Vitals:   02/05/23 2210 02/06/23 0700  BP: 121/75 (!) 145/83  Pulse: 95 88  Resp: 15   Temp: (!) 97 F (36.1 C) 97.7 F (36.5 C)  SpO2: 94% 99%   Gen: In bed, NAD Resp: non-labored breathing, no acute distress Abd: soft, nt  Neuro: Mental Status: Alert and oriented to person, place, time and situation, able to follow simple and complex commands Cranial Nerves: Pupils equal and reactive, extraocular movements intact, facial sensation symmetrical, face symmetrical, phonation normal, hearing intact to voice, shoulder shrug symmetrical, tongue midline Motor: 5/5 strength in bilateral upper extremities, proximal and distal, 4 -/5 strength in left lower extremity at hip flexor, 4/5 strength at hip flexor and right lower extremity, 4/5 strength at the bilateral knee flexion and extension, 4/5 strength with dorsiflexion and plantarflexion bilaterally Sensory: Intact to light touch and symmetrical except for a patch of numbness on the left thigh, subjective "tight" feeling in hands and feet. No clear loss of temp sensation in a length dependent manner. Does endorse colder temperature in the face compared to the arms/legs DTR: Unable to elicit Gait: Deferred  Pertinent Labs:    Latest Ref Rng & Units 02/05/2023    3:35 PM 02/03/2023   10:53 AM 02/01/2023    4:49 AM  CBC  WBC 4.0 - 10.5 K/uL 16.0  11.9  13.3   Hemoglobin  12.0 - 15.0 g/dL 82.9  56.2  13.0   Hematocrit 36.0 - 46.0 % 43.7  40.7  40.6   Platelets 150 - 400 K/uL 186  166  177        Latest Ref Rng & Units 02/04/2023    7:17 AM 02/03/2023    5:33 PM 02/01/2023    4:49 AM  BMP  Glucose 70 - 99 mg/dL  865  784   BUN 6 - 20 mg/dL  19  7   Creatinine 6.96 - 1.00 mg/dL 2.95  2.84  1.32   Sodium 135 - 145 mmol/L  137  139   Potassium 3.5 - 5.1 mmol/L  3.6  3.7   Chloride 98 - 111 mmol/L  102  109   CO2 22 - 32 mmol/L  25  22   Calcium 8.9 - 10.3 mg/dL  8.6  8.3      Imaging Reviewed:  No imaging this admission  Assessment: 57 year old patient with history of Guillain-Barr and also as above presented for worsening weakness and nerve conduction study consistent with demyelinating polyradiculoneuropathy, likely CIDP.  Patient began PLEX 5/16 and has had some improvement.   Impression: Progressive acute demyelinating polyradiculoneuropathy  Recommendations: 1) PLEX started 5/16   Session 2- 5/17  Session 3- 5/20  Session 4- 5/22  Session 5 (last session) anticipated 5/24 Patient may be discharged the following day on 5/25 2) PT/OT 3) neurology will continue to follow  Bing Neighbors, MD [facility] [phone]  If 7pm- 7am, please page neurology on call as listed in AMION.

## 2023-02-07 DIAGNOSIS — G6181 Chronic inflammatory demyelinating polyneuritis: Secondary | ICD-10-CM | POA: Diagnosis not present

## 2023-02-07 LAB — CBC
HCT: 41 % (ref 36.0–46.0)
Hemoglobin: 13.3 g/dL (ref 12.0–15.0)
MCH: 28.1 pg (ref 26.0–34.0)
MCHC: 32.4 g/dL (ref 30.0–36.0)
MCV: 86.7 fL (ref 80.0–100.0)
Platelets: 147 10*3/uL — ABNORMAL LOW (ref 150–400)
RBC: 4.73 MIL/uL (ref 3.87–5.11)
RDW: 14.6 % (ref 11.5–15.5)
WBC: 16.7 10*3/uL — ABNORMAL HIGH (ref 4.0–10.5)
nRBC: 0 % (ref 0.0–0.2)

## 2023-02-07 LAB — URINALYSIS, ROUTINE W REFLEX MICROSCOPIC
Bilirubin Urine: NEGATIVE
Glucose, UA: NEGATIVE mg/dL
Hgb urine dipstick: NEGATIVE
Ketones, ur: NEGATIVE mg/dL
Leukocytes,Ua: NEGATIVE
Nitrite: POSITIVE — AB
Protein, ur: NEGATIVE mg/dL
Specific Gravity, Urine: 1.012 (ref 1.005–1.030)
pH: 5 (ref 5.0–8.0)

## 2023-02-07 MED ORDER — DIPHENHYDRAMINE HCL 25 MG PO CAPS
25.0000 mg | ORAL_CAPSULE | Freq: Four times a day (QID) | ORAL | Status: DC | PRN
  Administered 2023-02-07: 25 mg via ORAL
  Filled 2023-02-07: qty 1

## 2023-02-07 MED ORDER — CALCIUM GLUCONATE-NACL 2-0.675 GM/100ML-% IV SOLN
2.0000 g | Freq: Once | INTRAVENOUS | Status: AC
  Administered 2023-02-07: 2000 mg via INTRAVENOUS
  Filled 2023-02-07: qty 100

## 2023-02-07 MED ORDER — SODIUM CHLORIDE 0.9 % IV SOLN
INTRAVENOUS | Status: AC
  Filled 2023-02-07 (×4): qty 200

## 2023-02-07 MED ORDER — HEPARIN SODIUM (PORCINE) 1000 UNIT/ML IJ SOLN
1000.0000 [IU] | Freq: Once | INTRAMUSCULAR | Status: AC
  Administered 2023-02-07: 1000 [IU]
  Filled 2023-02-07: qty 1

## 2023-02-07 MED ORDER — ACD FORMULA A 0.73-2.45-2.2 GM/100ML VI SOLN
1000.0000 mL | Status: DC
  Administered 2023-02-07: 1000 mL
  Filled 2023-02-07: qty 1000

## 2023-02-07 NOTE — Progress Notes (Addendum)
Neurology Progress Note  Brief HPI: 57 year old patient with history of B12 deficiency, anxiety, back pain, right breast mass, chronic opioid use, DJD, fibromyalgia, hyperlipidemia, hypertension, obesity and tobacco use was originally admitted in January for GBS.  She presented 2 days ago from her neurology office with nerve conduction study that showed evidence of demyelinating polyradiculoneuropathy.  She is currently undergoing PLEX per Dr. Zannie Cove recommendation which we concur with.  Subjective: Feeling much improved after 5th session of PLEX. Eager to go home tomorrow. No new complaints.  Exam: Vitals:   02/07/23 1624 02/07/23 1645  BP: 125/73 122/69  Pulse: 68 70  Resp: 16 17  Temp:  97.8 F (36.6 C)  SpO2: 100% 100%   Gen: In bed, NAD Resp: non-labored breathing, no acute distress Abd: soft, nt  Neuro: Mental Status: Alert and oriented to person, place, time and situation, able to follow simple and complex commands Cranial Nerves: Pupils equal and reactive, extraocular movements intact, facial sensation symmetrical, face symmetrical, phonation normal, hearing intact to voice, shoulder shrug symmetrical, tongue midline Motor: 5/5 strength in bilateral upper extremities, proximal and distal, 4+/5 strength in left lower extremity at hip flexor, 4+/5 strength at hip flexor and right lower extremity, +/5 strength at the bilateral knee flexion and extension, 4+/5 strength with dorsiflexion and plantarflexion bilaterally Sensory: Intact to light touch and symmetrical except for a patch of numbness on the left thigh, subjective "tight" feeling in hands and feet. No clear loss of temp sensation in a length dependent manner. Does endorse colder temperature in the face compared to the arms/legs DTR: Unable to elicit Gait: Deferred  Pertinent Labs:    Latest Ref Rng & Units 02/07/2023    6:05 AM 02/05/2023    3:35 PM 02/03/2023   10:53 AM  CBC  WBC 4.0 - 10.5 K/uL 16.7  16.0  11.9    Hemoglobin 12.0 - 15.0 g/dL 16.1  09.6  04.5   Hematocrit 36.0 - 46.0 % 41.0  43.7  40.7   Platelets 150 - 400 K/uL 147  186  166        Latest Ref Rng & Units 02/04/2023    7:17 AM 02/03/2023    5:33 PM 02/01/2023    4:49 AM  BMP  Glucose 70 - 99 mg/dL  409  811   BUN 6 - 20 mg/dL  19  7   Creatinine 9.14 - 1.00 mg/dL 7.82  9.56  2.13   Sodium 135 - 145 mmol/L  137  139   Potassium 3.5 - 5.1 mmol/L  3.6  3.7   Chloride 98 - 111 mmol/L  102  109   CO2 22 - 32 mmol/L  25  22   Calcium 8.9 - 10.3 mg/dL  8.6  8.3      Imaging Reviewed:  No imaging this admission  Assessment: 57 year old patient with history of Guillain-Barr and also as above presented for worsening weakness and nerve conduction study consistent with demyelinating polyradiculoneuropathy, likely CIDP.  Patient has improved after 5 sessions of PLEX.  Impression: CIDP  Recommendations: 1) 5 sessions of PLEX completed 5/24 2) PT/OT 3) Please contact radiology to remove her PLEX catheter 4) Patient may be discharged tomorrow after catheter is removed 5) Outpatient f/u with Dr. Terrace Arabia as scheduled  Neurology to sign off, please re-engage if additional neurologic concerns arise.  Bing Neighbors, MD [facility] [phone]  If 7pm- 7am, please page neurology on call as listed in AMION.

## 2023-02-07 NOTE — Progress Notes (Signed)
PT Cancellation Note  Patient Details Name: Dana [name] MRN: [id number] DOB: [birth year]   Cancelled Treatment:    Reason Eval/Treat Not Completed: Patient at procedure or test/unavailable (Pt in HD.)   Bevelyn Buckles 02/07/2023, 3:38 PM Xayla Puzio M,PT Acute Rehab Services [phone]

## 2023-02-07 NOTE — Progress Notes (Signed)
PROGRESS NOTE  Dana [name]  ZOX:[id number] DOB: 1966/08/26 DOA: 01/28/2023 PCP: Nelwyn Salisbury, MD   Brief Narrative: Patient is a 57 year old female with history of morbid obesity, chronic pain syndrome/degenerative disc disease, hypertension, recent diagnosis of [name] syndrome who was sent from neurology clinic for plasmapheresis for progressive demyelinating polyradiculoneuropathy.  Currently on plasmapheresis, plan for 5 sessions, the last session will be on today evening.  Neurology following.  Likely discharge will be tomorrow morning.  Assessment & Plan:  Principal Problem:   CIDP (chronic inflammatory demyelinating polyneuropathy) (HCC) Active Problems:   Obesity, Class III, BMI 40-49.9 (morbid obesity) (HCC)   HTN (hypertension)   Spinal stenosis of lumbar region with neurogenic claudication   Wheelchair dependence   Progressive  demyelinating polyradiculoneuropathy: Sent from neurology clinic.  Presented with worsening weakness.  Nerve conduction study consistent  with demyelinating polyradiculoneuropathy, likely CIDP.  Plex started on 5/16, now with  improvement.  Neurology closely following.  Plan for 5 sessions, last session will be on Friday. PT/OT following.  Recommended outpatient follow-up  Osteoarthritis of bilateral knees: Complains of bilateral knee pain and is due for injection of steroid.  Orthopedics saw her here and administer Depo-Medrol on the bilateral  knees  .patient is wheelchair dependent.  PT recommended outpatient follow-up  Spinal stenosis of lumbar region with neurogenic claudication, chronic pain syndrome: On Dilaudid, Flexeril, gabapentin  Hypertension: Currently blood pressure stable.  Continue metoprolol  Chronic bilateral lower EXTR edema: On Lasix at home  Morbid obesity: BMI of 48.8  Leukocytosis: This is most likely reactive ,likely  from the steroid injection.  Continue to monitor.  No signs of infectious process.  Will check  another CBC tomorrow morning.  Denies any shortness of breath, remains on room air, denies dysuria or frequency.No fever           DVT prophylaxis:Lovenox     Code Status: Full Code  Family Communication: Discussed with husband on phone from bedside on 5/23  Patient status:Lovenox  Patient is from :Home  Anticipated discharge WJ:XBJY  Estimated DC date: After completion of 5 sessions of plasmapheresis. Last plasmapheresis  on Friday, she wants to go home Saturday morning   Consultants: Neurology  Procedures: Plasmapheresis  Antimicrobials:  Anti-infectives (From admission, onward)    Start     Dose/Rate Route Frequency Ordered Stop   01/29/23 0945  ceFAZolin (ANCEF) IVPB 2g/100 mL premix        2 g 200 mL/hr over 30 Minutes Intravenous  Once 01/29/23 0853 01/29/23 1010       Subjective: Patient seen and examined at bedside today.  Hemodynamically stable.  Comfortable.  Denies any new complaints today.  She was eating her breakfast by sitting at the edge of the bed.  She says her weakness has been progressively improving  Objective: Vitals:   02/06/23 1541 02/06/23 1911 02/07/23 0523 02/07/23 0809  BP: (!) 138/52 (!) 138/54 (!) 148/87 131/73  Pulse: 79 86 79 80  Resp:  16 18 18   Temp: 97.7 F (36.5 C) 97.8 F (36.6 C) (!) 97.5 F (36.4 C) 98.2 F (36.8 C)  TempSrc: Oral Oral Oral Oral  SpO2: 100% 100% 100% 100%  Weight:      Height:        Intake/Output Summary (Last 24 hours) at 02/07/2023 1007 Last data filed at 02/07/2023 0941 Gross per 24 hour  Intake 240 ml  Output 750 ml  Net -510 ml   [employer]   02/03/23  2030 02/04/23 0500 02/05/23 2046  Weight: 128 kg 128.1 kg 128.1 kg    Examination: General exam: Overall comfortable, not in distress, morbidly obese HEENT: PERRL Respiratory system:  no wheezes or crackles  Cardiovascular system: S1 & S2 heard, RRR. Central line Gastrointestinal system: Abdomen is nondistended, soft and  nontender. Central nervous system: Alert and oriented, no focal deficits but generalized weakness Extremities: No edema, no clubbing ,no cyanosis Skin: No rashes, no ulcers,no icterus      Data Reviewed: I have personally reviewed following labs and imaging studies  CBC: Recent Labs  Lab 01/31/23 1153 02/01/23 0449 02/03/23 1053 02/05/23 1535 02/07/23 0605  WBC 13.7* 13.3* 11.9* 16.0* 16.7*  NEUTROABS 9.8*  --  8.4* 13.9*  --   HGB 13.4 12.9 13.2 13.9 13.3  HCT 42.4 40.6 40.7 43.7 41.0  MCV 87.6 88.5 86.0 86.4 86.7  PLT 199 177 166 186 147*   Basic Metabolic Panel: Recent Labs  Lab 02/01/23 0449 02/03/23 1733 02/04/23 0717  NA 139 137  --   K 3.7 3.6  --   CL 109 102  --   CO2 22 25  --   GLUCOSE 184* 135*  --   BUN 7 19  --   CREATININE 0.99 1.20* 1.13*  CALCIUM 8.3* 8.6*  --      No results found for this or any previous visit (from the past 240 hour(s)).   Radiology Studies: No results found.  Scheduled Meds:  Chlorhexidine Gluconate Cloth  6 each Topical Daily   dorzolamide-timolol  1 drop Both Eyes BID   enoxaparin (LOVENOX) injection  60 mg Subcutaneous Q24H   furosemide  20 mg Oral BID   gabapentin  900 mg Oral TID   latanoprost  1 drop Both Eyes QHS   metoprolol tartrate  50 mg Oral BID   pantoprazole  40 mg Oral Daily   sodium chloride flush  3 mL Intravenous Q12H   Continuous Infusions:     LOS: 10 days   Burnadette Pop, MD [facility] P5/24/2024, 10:07 AM

## 2023-02-07 NOTE — TOC Progression Note (Signed)
Transition of Care [facility]) - Progression Note    Patient Details  Name: Dana [name] MRN: [id number] Date of Birth: 1965-12-17  Transition of [facility]) CM/SW Contact  Janae Bridgeman, RN Phone Number: 02/07/2023, 11:03 AM  Clinical Narrative:    CM called [employer] and a RW was ordered to deliver to the patient's bedside.  Patient will receive her last plasmapheresis infusion today and will likely discharge home tomorrow.    PTAR will need to be arranged for home tomorrow.   Expected Discharge Plan: OP Rehab Barriers to Discharge: Continued Medical Work up  Expected Discharge Plan and Services   Discharge Planning Services: CM Consult Post Acute Care Choice: Resumption of Svcs/PTA Provider Living arrangements for the past 2 months: Single Family Home                                       Social Determinants of Health (SDOH) Interventions SDOH Screenings   Food Insecurity: No Food Insecurity (01/28/2023)  Housing: Low Risk  (01/28/2023)  Transportation Needs: No Transportation Needs (01/28/2023)  Utilities: Not At Risk (01/28/2023)  Depression (PHQ2-9): Medium Risk (12/30/2022)  Financial Resource Strain: Low Risk  (05/06/2022)  Physical Activity: Inactive (05/06/2022)  Stress: No Stress Concern Present (05/06/2022)  Tobacco Use: Medium Risk (01/31/2023)    Readmission Risk Interventions    01/29/2023    2:51 PM  Readmission Risk Prevention Plan  Post Dischage Appt Complete  Medication Screening Complete  Transportation Screening Complete

## 2023-02-07 NOTE — Progress Notes (Signed)
Mobility Specialist Progress Note   02/07/23 1232  Mobility  Activity Ambulated with assistance in room  Level of Assistance Contact guard assist, steadying assist  Assistive Device Front wheel walker  Distance Ambulated (ft) 16 ft  Activity Response Tolerated well  Mobility Referral Yes  $Mobility charge 1 Mobility  Mobility Specialist Start Time (ACUTE ONLY) 1158  Mobility Specialist Stop Time (ACUTE ONLY) 1224  Mobility Specialist Time Calculation (min) (ACUTE ONLY) 26 min   Pt found in bed initially disinclined for mobility but agreeable after encouragement. Pt having no complaints in LE's or shoulders before session. Stand by assist to EOB and CGA to stand. Pt requiring increased time to ambulate d/t pressure and mild pain in L knee but as ambulation progressed pain became tolerable. Returned back to EOB w/o fault, call bell placed in reach and all needs met.   Frederico Hamman Mobility Specialist Please contact via SecureChat or  Rehab office at [phone]

## 2023-02-08 ENCOUNTER — Inpatient Hospital Stay (HOSPITAL_COMMUNITY): Payer: 59

## 2023-02-08 DIAGNOSIS — G6181 Chronic inflammatory demyelinating polyneuritis: Secondary | ICD-10-CM | POA: Diagnosis not present

## 2023-02-08 HISTORY — PX: IR REMOVAL TUN CV CATH W/O FL: IMG2289

## 2023-02-08 LAB — CBC
HCT: 41.8 % (ref 36.0–46.0)
Hemoglobin: 13.2 g/dL (ref 12.0–15.0)
MCH: 27.7 pg (ref 26.0–34.0)
MCHC: 31.6 g/dL (ref 30.0–36.0)
MCV: 87.8 fL (ref 80.0–100.0)
Platelets: 141 10*3/uL — ABNORMAL LOW (ref 150–400)
RBC: 4.76 MIL/uL (ref 3.87–5.11)
RDW: 14.6 % (ref 11.5–15.5)
WBC: 18.4 10*3/uL — ABNORMAL HIGH (ref 4.0–10.5)
nRBC: 0 % (ref 0.0–0.2)

## 2023-02-08 LAB — BASIC METABOLIC PANEL
Anion gap: 7 (ref 5–15)
BUN: 21 mg/dL — ABNORMAL HIGH (ref 6–20)
CO2: 22 mmol/L (ref 22–32)
Calcium: 8.4 mg/dL — ABNORMAL LOW (ref 8.9–10.3)
Chloride: 110 mmol/L (ref 98–111)
Creatinine, Ser: 1.11 mg/dL — ABNORMAL HIGH (ref 0.44–1.00)
GFR, Estimated: 58 mL/min — ABNORMAL LOW (ref >60)
Glucose, Bld: 119 mg/dL — ABNORMAL HIGH (ref 70–99)
Potassium: 4 mmol/L (ref 3.5–5.1)
Sodium: 139 mmol/L (ref 135–145)

## 2023-02-08 LAB — URINALYSIS, ROUTINE W REFLEX MICROSCOPIC
Bilirubin Urine: NEGATIVE
Glucose, UA: NEGATIVE mg/dL
Hgb urine dipstick: NEGATIVE
Ketones, ur: NEGATIVE mg/dL
Leukocytes,Ua: NEGATIVE
Nitrite: POSITIVE — AB
Protein, ur: NEGATIVE mg/dL
Specific Gravity, Urine: 1.012 (ref 1.005–1.030)
pH: 5 (ref 5.0–8.0)

## 2023-02-08 MED ORDER — NITROFURANTOIN MONOHYD MACRO 100 MG PO CAPS: 100.0000 mg | ORAL_CAPSULE | Freq: Two times a day (BID) | ORAL | 0 refills | Status: AC

## 2023-02-08 NOTE — Plan of Care (Signed)

## 2023-02-08 NOTE — Progress Notes (Signed)
Patient upset and crying at [facility], expressed she had been waiting for the PTAR since afternoon. This RN Community education officer . Charge RN went to patient room and explained how PTAR works. This RN called PTAR and was told patient is next on the list and will be here ASAP. The same was informed to the patient

## 2023-02-08 NOTE — Progress Notes (Signed)
Physician Discharge Summary  Dana [name]:[id number] DOB: Sep 20, 1965 DOA: 01/28/2023  PCP: Nelwyn Salisbury, MD  Admit date: 01/28/2023 Discharge date: 02/08/2023  Admitted From: Home Disposition:  Home  Recommendations for Outpatient Follow-up:  Follow up with PCP in 1 week. Repeat CBC and BMP on follow-up visit Follow-up on pending urine culture result Complete antibiotics as prescribed for possible UTI Follow-up with Dr. Debarah Crape as scheduled  Home Health: Outpatient PT Equipment/Devices: None Discharge Condition: Stable CODE STATUS: Full code Diet recommendation: Heart healthy diet  Brief/Interim Summary: Patient is a 57 year old female with history of morbid obesity, chronic pain syndrome/degenerative disc disease, hypertension, recent diagnosis of Dana [name] syndrome who was sent from neurology clinic for plasmapheresis for progressive demyelinating polyradiculoneuropathy.  Neurology consulted.  Finished plasmapheresis on 02/07/2023.   Progressive  demyelinating polyradiculoneuropathy:  -Nerve conduction study consistent  with demyelinating polyradiculoneuropathy, likely CIDP.   -Plex started on 5/16, she finished Plex on 02/07/2023.   -PT/OT -Recommended outpatient follow-up -Consulted IR-Plex catheter removed on 5/25 -Neurology signed off and recommended follow-up with Dr. Debarah Crape outpatient  Leukocytosis:  -Leukocytosis trending up.  She complaining of foul-smelling urine for 2 days.  Remained afebrile.   -UA positive for nitrites.  Urine culture is pending.  Based on previous urine culture result I will send her home on Macrobid 100 mg twice daily for 7 days.  Recommended follow-up with PCP for pending urine culture result.  osteoarthritis of bilateral knees:  -Followed by Ortho and gets steroid injections as needed.  Spinal stenosis of lumbar region with neurogenic claudication,  chronic pain syndrome:  -Continued on Dilaudid, Flexeril, gabapentin   Hypertension:  Continued metoprolol   Chronic bilateral lower EXTR edema: Continued Lasix at home   Morbid obesity: BMI of 48.8 Recommend diet modification, exercise and weight loss.  Discharge Diagnoses:  Progressive demyelinating polyradiculoneuropathy Past arthritis of bilateral knees Spinal stenosis of lumbar region with neurogenic claudication Chronic pain syndrome Hypertension Chronic bilateral lower extremity edema Morbid obesity Leukocytosis UTI   Discharge Instructions  Discharge Instructions     Ambulatory referral to Physical Therapy   Complete by: As directed    Iontophoresis - 4 mg/ml of dexamethasone: No   T.E.N.S. Unit Evaluation and Dispense as Indicated: No   Diet - low sodium heart healthy   Complete by: As directed    Increase activity slowly   Complete by: As directed    No wound care   Complete by: As directed       Allergies as of 02/08/2023       Reactions   Zestril [lisinopril] Anaphylaxis   Codeine Hives   Cyanocobalamin [vitamin B12] Itching   Patient able to take B12 injections with Benadryl.  Has no issues when taking B12 tablets.    Hydrocodone Hives   Penicillins Itching, Swelling   Norvasc [amlodipine] Swelling   Tylenol [acetaminophen] Rash        Medication List     TAKE these medications    ALPRAZolam 0.5 MG tablet Commonly known as: XANAX Take 1 tablet (0.5 mg total) by mouth 2 (two) times daily as needed for anxiety.   cyclobenzaprine 10 MG tablet Commonly known as: FLEXERIL Take 1 tablet (10 mg total) by mouth 3 (three) times daily. What changed:  when to take this reasons to take this   dorzolamide-timolol 2-0.5 % ophthalmic solution Commonly known as: COSOPT Place 1 drop into both eyes 2 (two) times daily.   famotidine 20 MG tablet Commonly known as: PEPCID  Take 20 mg by mouth 2 (two) times daily.   fluticasone 50 MCG/ACT nasal spray Commonly known as: FLONASE SPRAY 2 SPRAYS INTO EACH NOSTRIL EVERY DAY What changed:  See the new instructions.   folic acid 1 MG tablet Commonly known as: FOLVITE Take 1 tablet (1 mg total) by mouth daily.   furosemide 40 MG tablet Commonly known as: LASIX Take 1 tablet (40 mg total) by mouth 2 (two) times daily.   gabapentin 300 MG capsule Commonly known as: Neurontin Take 3 capsules (900 mg total) by mouth 3 (three) times daily.   hydrocerin Crea Apply 1 Application topically 2 (two) times daily.   HYDROmorphone 4 MG tablet Commonly known as: Dilaudid Take 1 tablet (4 mg total) by mouth every 6 (six) hours as needed for severe pain.   ibuprofen 200 MG tablet Commonly known as: ADVIL Take 400 mg by mouth 2 (two) times daily as needed for headache or moderate pain.   latanoprost 0.005 % ophthalmic solution Commonly known as: XALATAN Place 1 drop into both eyes at bedtime.   magnesium oxide 400 MG tablet Commonly known as: MAG-OX Take 1 tablet (400 mg total) by mouth at bedtime. What changed: how much to take   metoprolol tartrate 50 MG tablet Commonly known as: LOPRESSOR Take 1 tablet (50 mg total) by mouth 2 (two) times daily.   multivitamin with minerals Tabs tablet Take 1 tablet by mouth daily.   nitrofurantoin (macrocrystal-monohydrate) 100 MG capsule Commonly known as: Macrobid Take 1 capsule (100 mg total) by mouth 2 (two) times daily for 7 days.   pantoprazole 40 MG tablet Commonly known as: PROTONIX Take 1 tablet (40 mg total) by mouth daily.   potassium chloride 10 MEQ tablet Commonly known as: KLOR-CON Take 10 mEq by mouth 2 (two) times daily.   promethazine 25 MG tablet Commonly known as: PHENERGAN Take 25 mg by mouth every 6 (six) hours as needed for nausea or vomiting.   senna-docusate 8.6-50 MG tablet Commonly known as: Senokot-S Take 1 tablet by mouth 2 (two) times daily. What changed:  when to take this reasons to take this   traZODone 50 MG tablet Commonly known as: DESYREL TAKE 1-2 TABLETS BY MOUTH AT BEDTIME AS  NEEDED FOR SLEEP. What changed:  how much to take reasons to take this additional instructions   VITAMIN B-12 PO Take 1 tablet by mouth daily.               Durable Medical Equipment  (From admission, onward)           Start     Ordered   02/04/23 1449  For home use only DME Walker rolling  Once       Comments: Wide rolling walker, heavy duty needed  Question Answer Comment  Walker: With 5 Inch Wheels   Patient needs a walker to treat with the following condition Generalized weakness      02/04/23 1448   02/04/23 1412  For home use only DME Bedside commode  Once       Comments: Wide Bedside commode with drop arms needed.  Question:  Patient needs a bedside commode to treat with the following condition  Answer:  Generalized weakness   02/04/23 1412            Follow-up Information     Connect with your PCP/Specialist as discussed. Schedule an appointment as soon as possible for a visit in 1 week(s).   Contact information: [email or web address] Call  our physician referral line at [phone].        [facility]. Call.   Specialty: Rehabilitation Why: Please follow up at the clinic to [facility] care at the Outpatient rehabilitation center. Contact information: [address] [phone] mc [place] [address] [phone]        Care, [facility] Follow up.   Why: [employer] will check on availability to provide the new wide RW. Contact information: [address] [place] [address] [phone]         Levert Feinstein, MD Follow up in 1 week(s).   Specialty: Neurology Contact information: [address] [place] [address] [phone]                Allergies  Allergen Reactions   Zestril [Lisinopril] Anaphylaxis   Codeine Hives   Cyanocobalamin [Vitamin B12] Itching    Patient able to take B12 injections with Benadryl.  Has no  issues when taking B12 tablets.    Hydrocodone Hives   Penicillins Itching and Swelling   Norvasc [Amlodipine] Swelling   Tylenol [Acetaminophen] Rash    Consultations: Neurology IR   Procedures/Studies: IR Removal Tun Cv Cath W/O FL  Result Date: 02/08/2023 INDICATION: Patient is status post tunneled dialysis catheter placement 01/29/2023 by Dr. Grace Isaac for plasmapheresis. Treatment completed and request made for removal. EXAM: REMOVAL TUNNELED CENTRAL VENOUS CATHETER MEDICATIONS: None ANESTHESIA/SEDATION: None FLUOROSCOPY TIME:  None COMPLICATIONS: None immediate. PROCEDURE: Informed written consent was obtained from the patient after a thorough discussion of the procedural risks, benefits and alternatives. All questions were addressed. Maximal Sterile Barrier Technique was utilized including caps, mask, sterile gowns, sterile gloves, sterile drape, hand hygiene and skin antiseptic. A timeout was performed prior to the initiation of the procedure. The patient's right chest and catheter was prepped and draped in a normal sterile fashion. Heparin was removed from both ports of catheter. 1% lidocaine was used for local anesthesia. Using gentle blunt dissection the cuff of the catheter was exposed and the catheter was removed in it's entirety. Pressure was held till hemostasis was obtained. A sterile dressing was applied. The patient tolerated the procedure well with no immediate complications. IMPRESSION: Successful catheter removal as described above. Performed by: Loyce Dys PA-C Electronically Signed   By: Malachy Moan M.D.   On: 02/08/2023 12:17   IR Fluoro Guide CV Line Right  Result Date: 01/29/2023 INDICATION: Progressive Guillain-Barre syndrome. In need of durable intravenous access for plasmapheresis treatment. EXAM: TUNNELED CENTRAL VENOUS HEMODIALYSIS CATHETER PLACEMENT WITH ULTRASOUND AND FLUOROSCOPIC GUIDANCE MEDICATIONS: Ancef 2 g IV. The antibiotic was given in an appropriate  time interval prior to skin puncture. ANESTHESIA/SEDATION: Moderate (conscious) sedation was employed during this procedure. A total of Versed 1.5 mg and Fentanyl 50 mcg was administered intravenously. Moderate Sedation Time: 12 minutes. The patient's level of consciousness and vital signs were monitored continuously by radiology nursing throughout the procedure under my direct supervision. FLUOROSCOPY TIME:  36 seconds (7 mGy) COMPLICATIONS: None immediate. PROCEDURE: Informed written consent was obtained from the patient after a discussion of the risks, benefits, and alternatives to treatment. Questions regarding the procedure were encouraged and answered. The right neck and chest were prepped with chlorhexidine in a sterile fashion, and a sterile drape was applied covering the operative field. Maximum barrier sterile technique with sterile gowns and gloves were used for the procedure. A timeout was performed prior to the initiation of the procedure. After creating a small  venotomy incision, a micropuncture kit was utilized to access the internal jugular vein. Real-time ultrasound guidance was utilized for vascular access including the acquisition of a permanent ultrasound image documenting patency of the accessed vessel. The microwire was utilized to measure appropriate catheter length. A stiff Glidewire was advanced to the level of the IVC and the micropuncture sheath was exchanged for a peel-away sheath. A palindrome tunneled hemodialysis catheter measuring 23 cm from tip to cuff was tunneled in a retrograde fashion from the anterior chest wall to the venotomy incision. The catheter was then placed through the peel-away sheath with tips ultimately positioned within the superior aspect of the right atrium. Final catheter positioning was confirmed and documented with a spot radiographic image. The catheter aspirates and flushes normally. The catheter was flushed with appropriate volume heparin dwells. The  catheter exit site was secured with a 0-Prolene retention suture. The venotomy incision was closed with an interrupted 4-0 Vicryl, Dermabond and Steri-strips. Dressings were applied. The patient tolerated the procedure well without immediate post procedural complication. IMPRESSION: Successful placement of 23 cm tip to cuff tunneled hemodialysis catheter via the right internal jugular vein with tips terminating within the superior aspect of the right atrium. The catheter is ready for immediate use. Electronically Signed   By: Simonne Come M.D.   On: 01/29/2023 11:23   NCV with EMG(electromyography)  Result Date: 01/28/2023 Levert Feinstein, MD     01/28/2023  1:03 PM     Full Name: Dana [name] Gender: Female MRN #: [id number] Date of Birth: September 18, 1965   Visit Date: 01/28/2023 09:17 Age: 29 Years Examining Physician: Dr. Levert Feinstein Referring Physician: Dr. Levert Feinstein Height: 5 feet 4 inch History: 57 year old female, presenting with ascending paresthesia, weakness, gait abnormality Summary of the tests: Nerve conduction study: Right sural, superficial peroneal, bilateral median, ulnar and radial sensory responses were absent. Left tibial motor responses were absent.  Left peroneal to EDB motor response showed normal CMAP amplitude at distal stimulation site, but was not able to elicit any response at proximal stimulation site. Right tibial motor responses showed significantly decreased CMAP amplitude, was not able to elicit any response at proximal stimulation side. Bilateral ulnar, right median motor responses showed moderately prolonged distal latency, F-wave latency. Right ulnar F-wave latency was absent.  Bilateral median, left ulnar F-wave latency was prolonged.  Right tibial F-wave latency was absent. Electromyography: Selected needle examinations of right upper, lower extremity muscles, right cervical and lumbosacral paraspinal muscles were performed. There was no evidence of axonal loss, no spontaneous activity,  normal morphology motor unit potential was mildly decreased activation. There was polyphasic motor unit potential at right lumbosacral paraspinal muscles.  There was no spontaneous activity at the right cervical paraspinal muscles.   Conclusion: This is an abnormal study.  There is electrodiagnostic evidence of demyelinating polyradiculoneuropathy, there is no evidence of axonal loss. ------------------------------- Levert Feinstein, M.D. Ph.D. [facility] [address] [place], [place] [address] Tel: [phone] Fax: [phone] Verbal informed consent was obtained from the patient, patient was informed of potential risk of procedure, including bruising, bleeding, hematoma formation, infection, muscle weakness, muscle pain, numbness, among others.     MNC   Nerve / Sites Muscle Latency Ref. Amplitude Ref. Rel Amp Segments Distance Velocity Ref. Area   ms ms mV mV %  cm m/s m/s mVms R Median - APB    Wrist APB 5.6 ?4.4 6.2 ?4.0 100 Wrist - APB 7   27.5    Upper  arm APB 9.9  5.6  89.6 Upper arm - Wrist 25 59 ?49 22.3 L Median - APB    Wrist APB 5.0 ?4.4 0.4 ?4.0 100 Wrist - APB 7   1.9    Upper arm APB 12.6  0.6  162 Upper arm - Wrist 25 33 ?49 1.3 R Ulnar - ADM    Wrist ADM 4.1 ?3.3 8.2 ?6.0 100 Wrist - ADM 7   23.9    B.Elbow ADM 6.4  8.7  105 B.Elbow - Wrist 10 44 ?49 27.5    A.Elbow ADM 10.4  6.7  77.8 A.Elbow - B.Elbow 19 47 ?49 23.1 L Ulnar - ADM    Wrist ADM 3.9 ?3.3 8.9 ?6.0 100 Wrist - ADM 7   35.2    B.Elbow ADM 6.1  7.4  83.4 B.Elbow - Wrist 10 46 ?49 29.6    A.Elbow ADM 10.6  7.8  106 A.Elbow - B.Elbow 20 44 ?49 30.0 R Peroneal - EDB    Ankle EDB 6.2 ?6.5 1.4 ?2.0 100 Ankle - EDB 9   4.5    Fib head EDB 14.6  0.2  17.3 Fib head - Ankle 24 29 ?44 0.5    Pop fossa EDB 20.3  0.0  7.5 Pop fossa - Fib head 13 23 ?44 0.0        Pop fossa - Ankle     L Peroneal - EDB    Ankle EDB 6.1 ?6.5 2.5 ?2.0 100 Ankle - EDB 9   7.9    Fib head EDB      Fib head - Ankle   ?44     Pop fossa EDB      Pop  fossa - Fib head   ?44         Pop fossa - Ankle     R Tibial - AH    Ankle AH 7.5 ?5.8 1.4 ?4.0 100 Ankle - AH 9   3.3    Pop fossa AH      Pop fossa - Ankle   ?41  L Tibial - AH    Ankle AH NR ?5.8 NR ?4.0 NR Ankle - AH 9   NR                   SNC   Nerve / Sites Rec. Site Peak Lat Ref.  Amp Ref. Segments Distance   ms ms V V  cm R Radial - Anatomical snuff box (Forearm)    Forearm Wrist NR ?2.9 NR ?15 Forearm - Wrist 10 L Radial - Anatomical snuff box (Forearm)    Forearm Wrist NR ?2.9 NR ?15 Forearm - Wrist 10 R Superficial peroneal - Ankle    Lat leg Ankle NR ?4.4 NR ?6 Lat leg - Ankle 14 L Superficial peroneal - Ankle    Lat leg Ankle NR ?4.4 NR ?6 Lat leg - Ankle 14 R Median - Orthodromic (Dig II, Mid palm)    Dig II Wrist NR ?3.4 NR ?10 Dig II - Wrist 13 L Median - Orthodromic (Dig II, Mid palm)    Dig II Wrist NR ?3.4 NR ?10 Dig II - Wrist 13 R Ulnar - Orthodromic, (Dig V, Mid palm)    Dig V Wrist NR ?3.1 NR ?5 Dig V - Wrist 11 L Ulnar - Orthodromic, (Dig V, Mid palm)    Dig V Wrist NR ?3.1 NR ?5 Dig V - Wrist 11  F  Wave   Nerve F Lat Ref.  ms ms R Median - APB 33.1 ?31.0 R Ulnar - ADM NR ?32.0 R Peroneal - EDB  NR ?56.0 R Tibial - AH NR ?56.0 L Median - APB 35.5 ?31.0 L Ulnar - ADM 33.8 ?32.0 L Peroneal - EDB NR ?56.0                 EMG Summary Table   Spontaneous MUAP Recruitment Muscle IA Fib PSW Fasc Other Amp Dur. Poly Pattern R. Tibialis anterior Normal None None None _______ Normal Normal Normal Normal R. Tibialis posterior Normal None None None _______ Normal Normal Normal Normal R. Peroneus longus Normal None None None _______ Normal Normal Normal Normal R. Gastrocnemius (Medial head) Normal None None None _______ Normal Normal Normal Normal R. Vastus lateralis Normal None None None _______ Normal Normal Normal Normal R. Lumbar paraspinals (low) Normal None None None _______ Increased Increased 1+ Reduced R. Lumbar paraspinals (mid) Normal None None None _______ Increased  Increased 1+ Reduced R. First dorsal interosseous Normal None None None _______ Normal Normal Normal Normal R. Pronator teres Normal None None None _______ Normal Normal Normal Normal R. Biceps brachii Normal None None None _______ Normal Normal Normal Normal R. Deltoid Normal None None None _______ Normal Normal Normal Normal R. Triceps brachii Normal None None None _______ Normal Normal Normal Normal R. Cervical paraspinals Normal None None None _______ Normal Normal Normal Normal       Subjective: Patient seen and examined.  Resting comfortably on the bed.  Complaining of foul-smelling urine for the past 2 days.  Remained afebrile.  No acute events overnight.  Comfortable going home.  Discharge Exam: Vitals:   02/08/23 0453 02/08/23 0825  BP: (!) 146/65 135/67  Pulse: 69 70  Resp: 15 18  Temp: 98.2 F (36.8 C) 98.2 F (36.8 C)  SpO2: 100% 100%   Vitals:   02/07/23 2021 02/08/23 0453 02/08/23 0711 02/08/23 0825  BP: 138/75 (!) 146/65  135/67  Pulse: 80 69  70  Resp: 18 15  18   Temp: 98.6 F (37 C) 98.2 F (36.8 C)  98.2 F (36.8 C)  TempSrc:      SpO2: 99% 100%  100%  Weight:   127.4 kg   Height:        General: Pt is alert, awake, not in acute distress, on room air, communicating well, obese Cardiovascular: RRR, S1/S2 +, no rubs, no gallops Respiratory: CTA bilaterally, no wheezing, no rhonchi Abdominal: Soft, NT, ND, bowel sounds + Extremities: no edema, no cyanosis    The results of significant diagnostics from this hospitalization (including imaging, microbiology, ancillary and laboratory) are listed below for reference.     Microbiology: No results found for this or any previous visit (from the past 240 hour(s)).   Labs: BNP (last 3 results) No results for input(s): "BNP" in the last 8760 hours. Basic Metabolic Panel: Recent Labs  Lab 02/03/23 1733 02/04/23 0717 02/08/23 0430  NA 137  --  139  K 3.6  --  4.0  CL 102  --  110  CO2 25  --  22  GLUCOSE  135*  --  119*  BUN 19  --  21*  CREATININE 1.20* 1.13* 1.11*  CALCIUM 8.6*  --  8.4*   Liver Function Tests: No results for input(s): "AST", "ALT", "ALKPHOS", "BILITOT", "PROT", "ALBUMIN" in the last 168 hours. No results for input(s): "LIPASE", "AMYLASE" in the last 168 hours. No  results for input(s): "AMMONIA" in the last 168 hours. CBC: Recent Labs  Lab 02/03/23 1053 02/05/23 1535 02/07/23 0605 02/08/23 0430  WBC 11.9* 16.0* 16.7* 18.4*  NEUTROABS 8.4* 13.9*  --   --   HGB 13.2 13.9 13.3 13.2  HCT 40.7 43.7 41.0 41.8  MCV 86.0 86.4 86.7 87.8  PLT 166 186 147* 141*   Cardiac Enzymes: No results for input(s): "CKTOTAL", "CKMB", "CKMBINDEX", "TROPONINI" in the last 168 hours. BNP: Invalid input(s): "POCBNP" CBG: No results for input(s): "GLUCAP" in the last 168 hours. D-Dimer No results for input(s): "DDIMER" in the last 72 hours. Hgb A1c No results for input(s): "HGBA1C" in the last 72 hours. Lipid Profile No results for input(s): "CHOL", "HDL", "LDLCALC", "TRIG", "CHOLHDL", "LDLDIRECT" in the last 72 hours. Thyroid function studies No results for input(s): "TSH", "T4TOTAL", "T3FREE", "THYROIDAB" in the last 72 hours.  Invalid input(s): "FREET3" Anemia work up No results for input(s): "VITAMINB12", "FOLATE", "FERRITIN", "TIBC", "IRON", "RETICCTPCT" in the last 72 hours. Urinalysis    Component Value Date/Time   COLORURINE YELLOW 02/08/2023 0806   APPEARANCEUR CLEAR 02/08/2023 0806   LABSPEC 1.012 02/08/2023 0806   PHURINE 5.0 02/08/2023 0806   GLUCOSEU NEGATIVE 02/08/2023 0806   GLUCOSEU NEGATIVE 05/03/2022 1345   HGBUR NEGATIVE 02/08/2023 0806   BILIRUBINUR NEGATIVE 02/08/2023 0806   BILIRUBINUR neg 12/31/2021 1311   KETONESUR NEGATIVE 02/08/2023 0806   PROTEINUR NEGATIVE 02/08/2023 0806   UROBILINOGEN 1.0 05/03/2022 1345   NITRITE POSITIVE (A) 02/08/2023 0806   LEUKOCYTESUR NEGATIVE 02/08/2023 0806   Sepsis Labs Recent Labs  Lab 02/03/23 1053  02/05/23 1535 02/07/23 0605 02/08/23 0430  WBC 11.9* 16.0* 16.7* 18.4*   Microbiology No results found for this or any previous visit (from the past 240 hour(s)).   Time coordinating discharge: Over 30 minutes  SIGNED:   Ollen Bowl, MD  [facility] 02/08/2023, 1:57 PM Pager   If 7PM-7AM, please contact night-coverage [email or web address]

## 2023-02-08 NOTE — TOC Transition Note (Signed)
Transition of Care [facility]) - CM/SW Discharge Note   Patient Details  Name: Dana [name] MRN: [id number] Date of Birth: 1966/01/03  [facility] CM/SW Contact:  Lawerance Sabal, RN Phone Number: 02/08/2023, 3:07 PM   Clinical Narrative:     Patient states she cannot get in or out of a car.  PTAR forms to chart and PTAR called at 15:00  Final next level of care: OP Rehab Barriers to Discharge: Continued Medical Work up   Patient Goals and CMS Choice CMS Medicare.gov Compare Post Acute Care list provided to:: Patient Choice offered to / list presented to : Patient  Discharge Placement                         Discharge Plan and Services Additional resources added to the After Visit Summary for     Discharge Planning Services: CM Consult Post Acute Care Choice: Resumption of Svcs/PTA Provider                               Social Determinants of Health (SDOH) Interventions SDOH Screenings   Food Insecurity: No Food Insecurity (01/28/2023)  Housing: Low Risk  (01/28/2023)  Transportation Needs: No Transportation Needs (01/28/2023)  Utilities: Not At Risk (01/28/2023)  Depression (PHQ2-9): Medium Risk (12/30/2022)  Financial Resource Strain: Low Risk  (05/06/2022)  Physical Activity: Inactive (05/06/2022)  Stress: No Stress Concern Present (05/06/2022)  Tobacco Use: Medium Risk (02/08/2023)     Readmission Risk Interventions    01/29/2023    2:51 PM  Readmission Risk Prevention Plan  Post Dischage Appt Complete  Medication Screening Complete  Transportation Screening Complete

## 2023-02-08 NOTE — Plan of Care (Signed)

## 2023-02-09 NOTE — Discharge Summary (Signed)
Physician Discharge Summary  Dana [name]:[id number] DOB: March 14, 1966 DOA: 01/28/2023   PCP: Nelwyn Salisbury, MD   Admit date: 01/28/2023 Discharge date: 02/08/2023   Admitted From: Home Disposition:  Home   Recommendations for Outpatient Follow-up:  Follow up with PCP in 1 week. Repeat CBC and BMP on follow-up visit Follow-up on pending urine culture result Complete antibiotics as prescribed for possible UTI Follow-up with Dr. Debarah Crape as scheduled   Home Health: Outpatient PT Equipment/Devices: None Discharge Condition: Stable CODE STATUS: Full code Diet recommendation: Heart healthy diet   Brief/Interim Summary: Patient is a 57 year old female with history of morbid obesity, chronic pain syndrome/degenerative disc disease, hypertension, recent diagnosis of [name] syndrome who was sent from neurology clinic for plasmapheresis for progressive demyelinating polyradiculoneuropathy.  Neurology consulted.  Finished plasmapheresis on 02/07/2023.    Progressive  demyelinating polyradiculoneuropathy:  -Nerve conduction study consistent  with demyelinating polyradiculoneuropathy, likely CIDP.   -Plex started on 5/16, she finished Plex on 02/07/2023.   -PT/OT -Recommended outpatient follow-up -Consulted IR-Plex catheter removed on 5/25 -Neurology signed off and recommended follow-up with Dr. Debarah Crape outpatient   Leukocytosis:  -Leukocytosis trending up.  She complaining of foul-smelling urine for 2 days.  Remained afebrile.   -UA positive for nitrites.  Urine culture is pending.  Based on previous urine culture result I will send her home on Macrobid 100 mg twice daily for 7 days.  Recommended follow-up with PCP for pending urine culture result.   osteoarthritis of bilateral knees:  -Followed by Ortho and gets steroid injections as needed.   Spinal stenosis of lumbar region with neurogenic claudication,  chronic pain syndrome:  -Continued on Dilaudid, Flexeril, gabapentin    Hypertension: Continued metoprolol   Chronic bilateral lower EXTR edema: Continued Lasix at home   Morbid obesity: BMI of 48.8 Recommend diet modification, exercise and weight loss.   Discharge Diagnoses:  Progressive demyelinating polyradiculoneuropathy Past arthritis of bilateral knees Spinal stenosis of lumbar region with neurogenic claudication Chronic pain syndrome Hypertension Chronic bilateral lower extremity edema Morbid obesity Leukocytosis UTI     Discharge Instructions   Discharge Instructions       Ambulatory referral to Physical Therapy   Complete by: As directed      Iontophoresis - 4 mg/ml of dexamethasone: No    T.E.N.S. Unit Evaluation and Dispense as Indicated: No    Diet - low sodium heart healthy   Complete by: As directed      Increase activity slowly   Complete by: As directed      No wound care   Complete by: As directed           Allergies as of 02/08/2023         Reactions    Zestril [lisinopril] Anaphylaxis    Codeine Hives    Cyanocobalamin [vitamin B12] Itching    Patient able to take B12 injections with Benadryl.  Has no issues when taking B12 tablets.     Hydrocodone Hives    Penicillins Itching, Swelling    Norvasc [amlodipine] Swelling    Tylenol [acetaminophen] Rash            Medication List       TAKE these medications     ALPRAZolam 0.5 MG tablet Commonly known as: XANAX Take 1 tablet (0.5 mg total) by mouth 2 (two) times daily as needed for anxiety.    cyclobenzaprine 10 MG tablet Commonly known as: FLEXERIL Take 1 tablet (10 mg total) by mouth  3 (three) times daily. What changed:  when to take this reasons to take this    dorzolamide-timolol 2-0.5 % ophthalmic solution Commonly known as: COSOPT Place 1 drop into both eyes 2 (two) times daily.    famotidine 20 MG tablet Commonly known as: PEPCID Take 20 mg by mouth 2 (two) times daily.    fluticasone 50 MCG/ACT nasal spray Commonly known as:  FLONASE SPRAY 2 SPRAYS INTO EACH NOSTRIL EVERY DAY What changed: See the new instructions.    folic acid 1 MG tablet Commonly known as: FOLVITE Take 1 tablet (1 mg total) by mouth daily.    furosemide 40 MG tablet Commonly known as: LASIX Take 1 tablet (40 mg total) by mouth 2 (two) times daily.    gabapentin 300 MG capsule Commonly known as: Neurontin Take 3 capsules (900 mg total) by mouth 3 (three) times daily.    hydrocerin Crea Apply 1 Application topically 2 (two) times daily.    HYDROmorphone 4 MG tablet Commonly known as: Dilaudid Take 1 tablet (4 mg total) by mouth every 6 (six) hours as needed for severe pain.    ibuprofen 200 MG tablet Commonly known as: ADVIL Take 400 mg by mouth 2 (two) times daily as needed for headache or moderate pain.    latanoprost 0.005 % ophthalmic solution Commonly known as: XALATAN Place 1 drop into both eyes at bedtime.    magnesium oxide 400 MG tablet Commonly known as: MAG-OX Take 1 tablet (400 mg total) by mouth at bedtime. What changed: how much to take    metoprolol tartrate 50 MG tablet Commonly known as: LOPRESSOR Take 1 tablet (50 mg total) by mouth 2 (two) times daily.    multivitamin with minerals Tabs tablet Take 1 tablet by mouth daily.    nitrofurantoin (macrocrystal-monohydrate) 100 MG capsule Commonly known as: Macrobid Take 1 capsule (100 mg total) by mouth 2 (two) times daily for 7 days.    pantoprazole 40 MG tablet Commonly known as: PROTONIX Take 1 tablet (40 mg total) by mouth daily.    potassium chloride 10 MEQ tablet Commonly known as: KLOR-CON Take 10 mEq by mouth 2 (two) times daily.    promethazine 25 MG tablet Commonly known as: PHENERGAN Take 25 mg by mouth every 6 (six) hours as needed for nausea or vomiting.    senna-docusate 8.6-50 MG tablet Commonly known as: Senokot-S Take 1 tablet by mouth 2 (two) times daily. What changed:  when to take this reasons to take this    traZODone 50  MG tablet Commonly known as: DESYREL TAKE 1-2 TABLETS BY MOUTH AT BEDTIME AS NEEDED FOR SLEEP. What changed:  how much to take reasons to take this additional instructions    VITAMIN B-12 PO Take 1 tablet by mouth daily.                        Durable Medical Equipment  (From admission, onward)                 Start     Ordered    02/04/23 1449   For home use only DME Walker rolling  Once       Comments: Wide rolling walker, heavy duty needed  Question Answer Comment  Walker: With 5 Inch Wheels    Patient needs a walker to treat with the following condition Generalized weakness       02/04/23 1448    02/04/23 1412   For  home use only DME Bedside commode  Once       Comments: Wide Bedside commode with drop arms needed.  Question:  Patient needs a bedside commode to treat with the following condition  Answer:  Generalized weakness   02/04/23 1412                  Follow-up Information       Connect with your PCP/Specialist as discussed. Schedule an appointment as soon as possible for a visit in 1 week(s).   Contact information: [email or web address] Call our physician referral line at [phone].            [facility] at [facility] Call.   Specialty: Rehabilitation Why: Please follow up at the clinic to [facility] care at the Outpatient rehabilitation center. Contact information: [address] [phone] mc [place] [address] [phone]            Care, [facility] Follow up.   Why: [employer] will check on availability to provide the new wide RW. Contact information: [address] [place] [address] [phone]              Levert Feinstein, MD Follow up in 1 week(s).   Specialty: Neurology Contact information: [address] [place] [address] [phone]                             Allergies  Allergen Reactions   Zestril  [Lisinopril] Anaphylaxis   Codeine Hives   Cyanocobalamin [Vitamin B12] Itching      Patient able to take B12 injections with Benadryl.  Has no issues when taking B12 tablets.    Hydrocodone Hives   Penicillins Itching and Swelling   Norvasc [Amlodipine] Swelling   Tylenol [Acetaminophen] Rash      Consultations: Neurology IR     Procedures/Studies: Imaging Results  IR Removal Tun Cv Cath W/O FL   Result Date: 02/08/2023 INDICATION: Patient is status post tunneled dialysis catheter placement 01/29/2023 by Dr. Grace Isaac for plasmapheresis. Treatment completed and request made for removal. EXAM: REMOVAL TUNNELED CENTRAL VENOUS CATHETER MEDICATIONS: None ANESTHESIA/SEDATION: None FLUOROSCOPY TIME:  None COMPLICATIONS: None immediate. PROCEDURE: Informed written consent was obtained from the patient after a thorough discussion of the procedural risks, benefits and alternatives. All questions were addressed. Maximal Sterile Barrier Technique was utilized including caps, mask, sterile gowns, sterile gloves, sterile drape, hand hygiene and skin antiseptic. A timeout was performed prior to the initiation of the procedure. The patient's right chest and catheter was prepped and draped in a normal sterile fashion. Heparin was removed from both ports of catheter. 1% lidocaine was used for local anesthesia. Using gentle blunt dissection the cuff of the catheter was exposed and the catheter was removed in it's entirety. Pressure was held till hemostasis was obtained. A sterile dressing was applied. The patient tolerated the procedure well with no immediate complications. IMPRESSION: Successful catheter removal as described above. Performed by: Loyce Dys PA-C Electronically Signed   By: Malachy Moan M.D.   On: 02/08/2023 12:17    IR Fluoro Guide CV Line Right   Result Date: 01/29/2023 INDICATION: Progressive Guillain-Barre syndrome. In need of durable intravenous access for plasmapheresis treatment.  EXAM: TUNNELED CENTRAL VENOUS HEMODIALYSIS CATHETER PLACEMENT WITH ULTRASOUND AND FLUOROSCOPIC GUIDANCE MEDICATIONS: Ancef 2 g IV. The antibiotic was given in an appropriate time interval prior to skin puncture. ANESTHESIA/SEDATION: Moderate (conscious) sedation was  employed during this procedure. A total of Versed 1.5 mg and Fentanyl 50 mcg was administered intravenously. Moderate Sedation Time: 12 minutes. The patient's level of consciousness and vital signs were monitored continuously by radiology nursing throughout the procedure under my direct supervision. FLUOROSCOPY TIME:  36 seconds (7 mGy) COMPLICATIONS: None immediate. PROCEDURE: Informed written consent was obtained from the patient after a discussion of the risks, benefits, and alternatives to treatment. Questions regarding the procedure were encouraged and answered. The right neck and chest were prepped with chlorhexidine in a sterile fashion, and a sterile drape was applied covering the operative field. Maximum barrier sterile technique with sterile gowns and gloves were used for the procedure. A timeout was performed prior to the initiation of the procedure. After creating a small venotomy incision, a micropuncture kit was utilized to access the internal jugular vein. Real-time ultrasound guidance was utilized for vascular access including the acquisition of a permanent ultrasound image documenting patency of the accessed vessel. The microwire was utilized to measure appropriate catheter length. A stiff Glidewire was advanced to the level of the IVC and the micropuncture sheath was exchanged for a peel-away sheath. A palindrome tunneled hemodialysis catheter measuring 23 cm from tip to cuff was tunneled in a retrograde fashion from the anterior chest wall to the venotomy incision. The catheter was then placed through the peel-away sheath with tips ultimately positioned within the superior aspect of the right atrium. Final catheter positioning was  confirmed and documented with a spot radiographic image. The catheter aspirates and flushes normally. The catheter was flushed with appropriate volume heparin dwells. The catheter exit site was secured with a 0-Prolene retention suture. The venotomy incision was closed with an interrupted 4-0 Vicryl, Dermabond and Steri-strips. Dressings were applied. The patient tolerated the procedure well without immediate post procedural complication. IMPRESSION: Successful placement of 23 cm tip to cuff tunneled hemodialysis catheter via the right internal jugular vein with tips terminating within the superior aspect of the right atrium. The catheter is ready for immediate use. Electronically Signed   By: Simonne Come M.D.   On: 01/29/2023 11:23    NCV with EMG(electromyography)   Result Date: 01/28/2023 Levert Feinstein, MD     01/28/2023  1:03 PM     Full Name: Dana [name] Gender: Female MRN #: [id number] Date of Birth: 1965-11-20   Visit Date: 01/28/2023 09:17 Age: 57 Years Examining Physician: Dr. Levert Feinstein Referring Physician: Dr. Levert Feinstein Height: 5 feet 4 inch History: 57 year old female, presenting with ascending paresthesia, weakness, gait abnormality Summary of the tests: Nerve conduction study: Right sural, superficial peroneal, bilateral median, ulnar and radial sensory responses were absent. Left tibial motor responses were absent.  Left peroneal to EDB motor response showed normal CMAP amplitude at distal stimulation site, but was not able to elicit any response at proximal stimulation site. Right tibial motor responses showed significantly decreased CMAP amplitude, was not able to elicit any response at proximal stimulation side. Bilateral ulnar, right median motor responses showed moderately prolonged distal latency, F-wave latency. Right ulnar F-wave latency was absent.  Bilateral median, left ulnar F-wave latency was prolonged.  Right tibial F-wave latency was absent. Electromyography: Selected needle examinations  of right upper, lower extremity muscles, right cervical and lumbosacral paraspinal muscles were performed. There was no evidence of axonal loss, no spontaneous activity, normal morphology motor unit potential was mildly decreased activation. There was polyphasic motor unit potential at right lumbosacral paraspinal muscles.  There was no spontaneous activity at the  right cervical paraspinal muscles.   Conclusion: This is an abnormal study.  There is electrodiagnostic evidence of demyelinating polyradiculoneuropathy, there is no evidence of axonal loss. ------------------------------- Levert Feinstein, M.D. Ph.D. [facility] [address] [place], [place] [address] Tel: [phone] Fax: [phone] Verbal informed consent was obtained from the patient, patient was informed of potential risk of procedure, including bruising, bleeding, hematoma formation, infection, muscle weakness, muscle pain, numbness, among others.     MNC   Nerve / Sites Muscle Latency Ref. Amplitude Ref. Rel Amp Segments Distance Velocity Ref. Area   ms ms mV mV %  cm m/s m/s mVms R Median - APB    Wrist APB 5.6 ?4.4 6.2 ?4.0 100 Wrist - APB 7   27.5    Upper arm APB 9.9  5.6  89.6 Upper arm - Wrist 25 59 ?49 22.3 L Median - APB    Wrist APB 5.0 ?4.4 0.4 ?4.0 100 Wrist - APB 7   1.9    Upper arm APB 12.6  0.6  162 Upper arm - Wrist 25 33 ?49 1.3 R Ulnar - ADM    Wrist ADM 4.1 ?3.3 8.2 ?6.0 100 Wrist - ADM 7   23.9    B.Elbow ADM 6.4  8.7  105 B.Elbow - Wrist 10 44 ?49 27.5    A.Elbow ADM 10.4  6.7  77.8 A.Elbow - B.Elbow 19 47 ?49 23.1 L Ulnar - ADM    Wrist ADM 3.9 ?3.3 8.9 ?6.0 100 Wrist - ADM 7   35.2    B.Elbow ADM 6.1  7.4  83.4 B.Elbow - Wrist 10 46 ?49 29.6    A.Elbow ADM 10.6  7.8  106 A.Elbow - B.Elbow 20 44 ?49 30.0 R Peroneal - EDB    Ankle EDB 6.2 ?6.5 1.4 ?2.0 100 Ankle - EDB 9   4.5    Fib head EDB 14.6  0.2  17.3 Fib head - Ankle 24 29 ?44 0.5    Pop fossa EDB 20.3  0.0  7.5 Pop fossa - Fib head 13 23 ?44 0.0         Pop fossa - Ankle     L Peroneal - EDB    Ankle EDB 6.1 ?6.5 2.5 ?2.0 100 Ankle - EDB 9   7.9    Fib head EDB      Fib head - Ankle   ?44     Pop fossa EDB      Pop fossa - Fib head   ?44         Pop fossa - Ankle     R Tibial - AH    Ankle AH 7.5 ?5.8 1.4 ?4.0 100 Ankle - AH 9   3.3    Pop fossa AH      Pop fossa - Ankle   ?41  L Tibial - AH    Ankle AH NR ?5.8 NR ?4.0 NR Ankle - AH 9   NR                   SNC   Nerve / Sites Rec. Site Peak Lat Ref.  Amp Ref. Segments Distance   ms ms V V  cm R Radial - Anatomical snuff box (Forearm)    Forearm Wrist NR ?2.9 NR ?15 Forearm - Wrist 10 L Radial - Anatomical snuff box (Forearm)    Forearm Wrist NR ?2.9 NR ?15 Forearm - Wrist 10 R Superficial peroneal - Ankle  Lat leg Ankle NR ?4.4 NR ?6 Lat leg - Ankle 14 L Superficial peroneal - Ankle    Lat leg Ankle NR ?4.4 NR ?6 Lat leg - Ankle 14 R Median - Orthodromic (Dig II, Mid palm)    Dig II Wrist NR ?3.4 NR ?10 Dig II - Wrist 13 L Median - Orthodromic (Dig II, Mid palm)    Dig II Wrist NR ?3.4 NR ?10 Dig II - Wrist 13 R Ulnar - Orthodromic, (Dig V, Mid palm)    Dig V Wrist NR ?3.1 NR ?5 Dig V - Wrist 11 L Ulnar - Orthodromic, (Dig V, Mid palm)    Dig V Wrist NR ?3.1 NR ?5 Dig V - Wrist 77                     F  Wave   Nerve F Lat Ref.  ms ms R Median - APB 33.1 ?31.0 R Ulnar - ADM NR ?32.0 R Peroneal - EDB  NR ?56.0 R Tibial - AH NR ?56.0 L Median - APB 35.5 ?31.0 L Ulnar - ADM 33.8 ?32.0 L Peroneal - EDB NR ?56.0                 EMG Summary Table   Spontaneous MUAP Recruitment Muscle IA Fib PSW Fasc Other Amp Dur. Poly Pattern R. Tibialis anterior Normal None None None _______ Normal Normal Normal Normal R. Tibialis posterior Normal None None None _______ Normal Normal Normal Normal R. Peroneus longus Normal None None None _______ Normal Normal Normal Normal R. Gastrocnemius (Medial head) Normal None None None _______ Normal Normal Normal Normal R. Vastus lateralis Normal None None None _______ Normal Normal  Normal Normal R. Lumbar paraspinals (low) Normal None None None _______ Increased Increased 1+ Reduced R. Lumbar paraspinals (mid) Normal None None None _______ Increased Increased 1+ Reduced R. First dorsal interosseous Normal None None None _______ Normal Normal Normal Normal R. Pronator teres Normal None None None _______ Normal Normal Normal Normal R. Biceps brachii Normal None None None _______ Normal Normal Normal Normal R. Deltoid Normal None None None _______ Normal Normal Normal Normal R. Triceps brachii Normal None None None _______ Normal Normal Normal Normal R. Cervical paraspinals Normal None None None _______ Normal Normal Normal Normal            Subjective: Patient seen and examined.  Resting comfortably on the bed.  Complaining of foul-smelling urine for the past 2 days.  Remained afebrile.  No acute events overnight.  Comfortable going home.   Discharge Exam:     Vitals:    02/08/23 0453 02/08/23 0825  BP: (!) 146/65 135/67  Pulse: 69 70  Resp: 15 18  Temp: 98.2 F (36.8 C) 98.2 F (36.8 C)  SpO2: 100% 100%          Vitals:    02/07/23 2021 02/08/23 0453 02/08/23 0711 02/08/23 0825  BP: 138/75 (!) 146/65   135/67  Pulse: 80 69   70  Resp: 18 15   18   Temp: 98.6 F (37 C) 98.2 F (36.8 C)   98.2 F (36.8 C)  TempSrc:          SpO2: 99% 100%   100%  Weight:     127.4 kg    Height:              General: Pt is alert, awake, not in acute distress, on room air, communicating well, obese Cardiovascular: RRR, S1/S2 +,  no rubs, no gallops Respiratory: CTA bilaterally, no wheezing, no rhonchi Abdominal: Soft, NT, ND, bowel sounds + Extremities: no edema, no cyanosis       The results of significant diagnostics from this hospitalization (including imaging, microbiology, ancillary and laboratory) are listed below for reference.       Microbiology: No results found for this or any previous visit (from the past 240 hour(s)).    Labs: BNP (last 3 results) Recent  Labs (within last 365 days)  No results for input(s): "BNP" in the last 8760 hours.   Basic Metabolic Panel: Last Labs       Recent Labs  Lab 02/03/23 1733 02/04/23 0717 02/08/23 0430  NA 137  --  139  K 3.6  --  4.0  CL 102  --  110  CO2 25  --  22  GLUCOSE 135*  --  119*  BUN 19  --  21*  CREATININE 1.20* 1.13* 1.11*  CALCIUM 8.6*  --  8.4*      Liver Function Tests: Last Labs  No results for input(s): "AST", "ALT", "ALKPHOS", "BILITOT", "PROT", "ALBUMIN" in the last 168 hours.   Last Labs  No results for input(s): "LIPASE", "AMYLASE" in the last 168 hours.   Last Labs  No results for input(s): "AMMONIA" in the last 168 hours.   CBC: Last Labs        Recent Labs  Lab 02/03/23 1053 02/05/23 1535 02/07/23 0605 02/08/23 0430  WBC 11.9* 16.0* 16.7* 18.4*  NEUTROABS 8.4* 13.9*  --   --   HGB 13.2 13.9 13.3 13.2  HCT 40.7 43.7 41.0 41.8  MCV 86.0 86.4 86.7 87.8  PLT 166 186 147* 141*      Cardiac Enzymes: Last Labs  No results for input(s): "CKTOTAL", "CKMB", "CKMBINDEX", "TROPONINI" in the last 168 hours.   BNP: Last Labs  Invalid input(s): "POCBNP"   CBG: Last Labs  No results for input(s): "GLUCAP" in the last 168 hours.   D-Dimer Recent Labs (last 2 labs)  No results for input(s): "DDIMER" in the last 72 hours.   Hgb A1c Recent Labs (last 2 labs)  No results for input(s): "HGBA1C" in the last 72 hours.   Lipid Profile Recent Labs (last 2 labs)  No results for input(s): "CHOL", "HDL", "LDLCALC", "TRIG", "CHOLHDL", "LDLDIRECT" in the last 72 hours.   Thyroid function studies  Recent Labs (last 2 labs)  No results for input(s): "TSH", "T4TOTAL", "T3FREE", "THYROIDAB" in the last 72 hours.   Invalid input(s): "FREET3"   Anemia work up [employer] (last 2 labs)  No results for input(s): "VITAMINB12", "FOLATE", "FERRITIN", "TIBC", "IRON", "RETICCTPCT" in the last 72 hours.   Urinalysis Labs (Brief)          Component Value Date/Time     COLORURINE YELLOW 02/08/2023 0806    APPEARANCEUR CLEAR 02/08/2023 0806    LABSPEC 1.012 02/08/2023 0806    PHURINE 5.0 02/08/2023 0806    GLUCOSEU NEGATIVE 02/08/2023 0806    GLUCOSEU NEGATIVE 05/03/2022 1345    HGBUR NEGATIVE 02/08/2023 0806    BILIRUBINUR NEGATIVE 02/08/2023 0806    BILIRUBINUR neg 12/31/2021 1311    KETONESUR NEGATIVE 02/08/2023 0806    PROTEINUR NEGATIVE 02/08/2023 0806    UROBILINOGEN 1.0 05/03/2022 1345    NITRITE POSITIVE (A) 02/08/2023 0806    LEUKOCYTESUR NEGATIVE 02/08/2023 0806      Sepsis Labs Last Labs        Recent Labs  Lab 02/03/23 1053  02/05/23 1535 02/07/23 0605 02/08/23 0430  WBC 11.9* 16.0* 16.7* 18.4*      Microbiology No results found for this or any previous visit (from the past 240 hour(s)).     Time coordinating discharge: Over 30 minutes   SIGNED:     Ollen Bowl, MD  [facility] 02/08/2023, 1:57 PM Pager    If 7PM-7AM, please contact night-coverage [email or web address]

## 2023-02-10 LAB — URINE CULTURE: Culture: >=100000 — AB

## 2023-02-11 ENCOUNTER — Telehealth: Payer: Self-pay | Admitting: Family Medicine

## 2023-02-11 ENCOUNTER — Telehealth: Payer: Self-pay

## 2023-02-11 NOTE — Telephone Encounter (Signed)
Pt called very upset, stating it is now almost 3 pm and she still has not heard from the pharmacy.  Pt says she is in a lot of pain, and since her medication is not available, she needs an alternative sent ASAP!!  Pt was informed that MD is OOO, but we would try our best to get another provider to fill this request for her.   Please advise.

## 2023-02-11 NOTE — Telephone Encounter (Signed)
Spoke with pt pharmacy they have a prescription on hold for pt Hydromorphone 4 mg. Pharmacist state that the medication is on back order and that there is no pharmacy within [place] has this Rx in stock due to Physicist, medical. Pt was discharged from the hospital on 02/08/23. Called pt to update her on this states that she needs pain medication but she is not sure what she can take since she is allergic to Hydrocodone/ Oxycodone, pt has a Hospital f/u appointment with Dr Clent Ridges on 02/19/23 but PCP is unavailable. Please advise

## 2023-02-11 NOTE — Telephone Encounter (Signed)
Prescription Request  02/11/2023  LOV: 12/05/2022  What is the name of the medication or equipment? Hydromorphone. Pt states she has been trying to get this mediation refilled, but all the pharmacy's she has called state it is unavailable. Pt needs medication filled for pain. She has recently been discharged from hospital. Has Hospital f/u appt with Dr. Clent Ridges on 02/19/23.  Have you contacted your pharmacy to request a refill? Yes   Which pharmacy would you like this sent to? [id number] [place], [place] - [address] [place] [address] Phone: [phone] Fax: [phone]   Patient notified that their request is being sent to the clinical staff for review and that they should receive a response within 2 business days.   Please advise at Mobile [phone] (mobile)

## 2023-02-11 NOTE — Transitions of Care (Post Inpatient/ED Visit) (Signed)
02/11/2023  Name: Dana [name] MRN: [id number] DOB: 10/07/1965  Today's TOC FU Call Status: Today's TOC FU Call Status:: Successful TOC FU Call Competed TOC FU Call Complete Date: 02/11/23  Transition Care Management Follow-up Telephone Call Date of Discharge: 02/11/23 Discharge Facility: [facility]) Type of Discharge: Inpatient Admission How have you been since you were released from the hospital?: Better Any questions or concerns?: No  Items Reviewed: Did you receive and understand the discharge instructions provided?: Yes Medications obtained,verified, and reconciled?: Yes (Medications Reviewed) Any new allergies since your discharge?: No Dietary orders reviewed?: Yes Type of Diet Ordered:: heart diet Do you have support at home?: Yes People in Home: spouse  Medications Reviewed Today: Medications Reviewed Today     Reviewed by Annabell Sabal, CMA (Certified Medical Assistant) on 02/11/23 at 1159  Med List Status: <None>   Medication Order Taking? Sig Documenting Provider Last Dose Status Informant  ALPRAZolam (XANAX) 0.5 MG tablet [id number] Yes Take 1 tablet (0.5 mg total) by mouth 2 (two) times daily as needed for anxiety. Nelwyn Salisbury, MD Taking Active Self, Pharmacy Records  Cyanocobalamin (VITAMIN B-12 PO) [id number] Yes Take 1 tablet by mouth daily. [provider] Taking Active Self  cyclobenzaprine (FLEXERIL) 10 MG tablet [id number] Yes Take 1 tablet (10 mg total) by mouth 3 (three) times daily.  Patient taking differently: Take 10 mg by mouth 3 (three) times daily as needed for muscle spasms.   Lovorn, Aundra Millet, MD Taking Active Self, Pharmacy Records  dorzolamide-timolol (COSOPT) 2-0.5 % ophthalmic solution [id number] Yes Place 1 drop into both eyes 2 (two) times daily. [provider] Taking Active Self, Pharmacy Records  famotidine (PEPCID) 20 MG tablet [id number] Yes Take 20 mg by mouth 2 (two) times daily. [provider] Taking  Active Self, Pharmacy Records  fluticasone [facility]) 50 MCG/ACT nasal spray [id number] Yes SPRAY 2 SPRAYS INTO EACH NOSTRIL EVERY DAY  Patient taking differently: Place 2 sprays into both nostrils daily as needed for allergies.   Nelwyn Salisbury, MD Taking Active Self, Pharmacy Records  folic acid (FOLVITE) 1 MG tablet [id number] Yes Take 1 tablet (1 mg total) by mouth daily. Nelwyn Salisbury, MD Taking Active Self, Pharmacy Records  furosemide (LASIX) 40 MG tablet [id number] Yes Take 1 tablet (40 mg total) by mouth 2 (two) times daily. Nelwyn Salisbury, MD Taking Active Self, Pharmacy Records  gabapentin (NEURONTIN) 300 MG capsule [id number] Yes Take 3 capsules (900 mg total) by mouth 3 (three) times daily. Genice Rouge, MD Taking Active Self, Pharmacy Records  hydrocerin [facility]) CREA [id number] Yes Apply 1 Application topically 2 (two) times daily. Setzer, Lynnell Jude, PA-C Taking Active Self, Pharmacy Records  HYDROmorphone (DILAUDID) 4 MG tablet [id number] Yes Take 1 tablet (4 mg total) by mouth every 6 (six) hours as needed for severe pain. Nelwyn Salisbury, MD Taking Active Self, Pharmacy Records  ibuprofen (ADVIL) 200 MG tablet [id number] Yes Take 400 mg by mouth 2 (two) times daily as needed for headache or moderate pain. [provider] Taking Active Self  latanoprost (XALATAN) 0.005 % ophthalmic solution [id number] Yes Place 1 drop into both eyes at bedtime. [provider] Taking Active Self, Pharmacy Records  magnesium oxide (MAG-OX) 400 MG tablet [id number] Yes Take 1 tablet (400 mg total) by mouth at bedtime.  Patient taking differently: Take 200 mg by mouth at bedtime.   Nelwyn Salisbury, MD Taking Active Self, Pharmacy Records  metoprolol tartrate (LOPRESSOR) 50 MG tablet [id number]  Yes Take 1 tablet (50 mg total) by mouth 2 (two) times daily. Nelwyn Salisbury, MD Taking Active Self, Pharmacy Records  Multiple Vitamin (MULTIVITAMIN WITH MINERALS) TABS tablet [id number] Yes Take 1 tablet by  mouth daily. Regalado, Prentiss Bells, MD Taking Active Self  nitrofurantoin, macrocrystal-monohydrate, (MACROBID) 100 MG capsule [id number] Yes Take 1 capsule (100 mg total) by mouth 2 (two) times daily for 7 days. Pahwani, Kasandra Knudsen, MD Taking Active   pantoprazole (PROTONIX) 40 MG tablet [id number] Yes Take 1 tablet (40 mg total) by mouth daily. Nelwyn Salisbury, MD Taking Active Self, Pharmacy Records  potassium chloride (KLOR-CON) 10 MEQ tablet [id number] Yes Take 10 mEq by mouth 2 (two) times daily. [provider] Taking Active Self, Pharmacy Records  promethazine (PHENERGAN) 25 MG tablet [id number] Yes Take 25 mg by mouth every 6 (six) hours as needed for nausea or vomiting. [provider] Taking Active Self, Pharmacy Records  senna-docusate (SENOKOT-S) 8.6-50 MG tablet [id number] Yes Take 1 tablet by mouth 2 (two) times daily.  Patient taking differently: Take 1 tablet by mouth 2 (two) times daily as needed for moderate constipation.   Setzer, Lynnell Jude, PA-C Taking Active Self, Pharmacy Records  traZODone (DESYREL) 50 MG tablet [id number] Yes TAKE 1-2 TABLETS BY MOUTH AT BEDTIME AS NEEDED FOR SLEEP.  Patient taking differently: Take 50 mg by mouth at bedtime as needed for sleep.   Lovorn, Aundra Millet, MD Taking Active Self, Pharmacy Records            Home Care and Equipment/Supplies: Were Home Health Services Ordered?: NA Any new equipment or medical supplies ordered?: Yes Name of Medical supply agency?: [employer] Were you able to get the equipment/medical supplies?: Yes (walker) Do you have any questions related to the use of the equipment/supplies?: No  Functional Questionnaire: Do you need assistance with bathing/showering or dressing?: No Do you need assistance with meal preparation?: No Do you need assistance with eating?: No Do you have difficulty maintaining continence: No Do you need assistance with getting out of bed/getting out of a chair/moving?: No Do you have  difficulty managing or taking your medications?: No  Follow up appointments reviewed: PCP Follow-up appointment confirmed?: Yes Date of PCP follow-up appointment?: 02/19/23 Follow-up Provider: [facility] Follow-up appointment confirmed?: No Reason Specialist Follow-Up Not Confirmed: Patient has Specialist Provider Number and will Call for Appointment Do you need transportation to your follow-up appointment?: No Do you understand care options if your condition(s) worsen?: Yes-patient verbalized understanding    SIGNATURE  Annabell Sabal, CMA [facility] , [facility]

## 2023-02-11 NOTE — Telephone Encounter (Signed)
Pt calling back checking on progress of her refill request.

## 2023-02-12 ENCOUNTER — Ambulatory Visit: Payer: 59 | Admitting: Physical Therapy

## 2023-02-12 ENCOUNTER — Other Ambulatory Visit: Payer: Self-pay | Admitting: Adult Health

## 2023-02-12 ENCOUNTER — Ambulatory Visit: Payer: 59 | Admitting: Occupational Therapy

## 2023-02-12 MED ORDER — HYDROMORPHONE HCL 4 MG PO TABS: 4.0000 mg | ORAL_TABLET | Freq: Four times a day (QID) | ORAL | 0 refills | Status: DC | PRN

## 2023-02-12 NOTE — Telephone Encounter (Signed)
Pt has been notified to pick up Rx from [facility] on [address]

## 2023-02-13 ENCOUNTER — Ambulatory Visit (INDEPENDENT_AMBULATORY_CARE_PROVIDER_SITE_OTHER): Payer: 59 | Admitting: Diagnostic Neuroimaging

## 2023-02-13 ENCOUNTER — Ambulatory Visit: Payer: 59 | Admitting: Physical Therapy

## 2023-02-13 ENCOUNTER — Encounter: Payer: Self-pay | Admitting: Diagnostic Neuroimaging

## 2023-02-13 VITALS — BP 140/97 | HR 81 | Ht 64.0 in | Wt 275.0 lb

## 2023-02-13 DIAGNOSIS — G6181 Chronic inflammatory demyelinating polyneuritis: Secondary | ICD-10-CM

## 2023-02-13 NOTE — Progress Notes (Signed)
[facility]  [name] DOB: 16-Mar-1966  REFERRING CLINICIAN: Nelwyn Salisbury, MD HISTORY FROM: patient  REASON FOR VISIT: new consult   HISTORICAL  CHIEF COMPLAINT:  Chief Complaint  Patient presents with   Follow-up    Rm 7,  Please order labs IGG, IGA and IGM.  Follow up from ER/ left leg pain continues, having on stomach and both arms that radiate up to the neck.      HISTORY OF PRESENT ILLNESS:   UPDATE (02/13/23, VRP): 57 year old female with CIDP, here for hospital follow up, discharged on 02/08/23. S/p PLEX x 5 sessions. Now with improving strength in lower ext.  She is able to use a walker at home and walk short distances.  Still with some intermittent muscle spasms in her body.  Planning for maintenance IVIG.    REVIEW OF SYSTEMS: Full 14 system review of systems performed and negative with exception of: as per HPI.  ALLERGIES: Allergies  Allergen Reactions   Zestril [Lisinopril] Anaphylaxis   Codeine Hives   Cyanocobalamin [Vitamin B12] Itching    Patient able to take B12 injections with Benadryl.  Has no issues when taking B12 tablets.    Hydrocodone Hives   Penicillins Itching and Swelling   Norvasc [Amlodipine] Swelling   Tylenol [Acetaminophen] Rash    HOME MEDICATIONS: Outpatient Medications Prior to Visit  Medication Sig Dispense Refill   ALPRAZolam (XANAX) 0.5 MG tablet Take 1 tablet (0.5 mg total) by mouth 2 (two) times daily as needed for anxiety. 180 tablet 1   Cyanocobalamin (VITAMIN B-12 PO) Take 1 tablet by mouth daily.     cyclobenzaprine (FLEXERIL) 10 MG tablet Take 1 tablet (10 mg total) by mouth 3 (three) times daily. (Patient taking differently: Take 10 mg by mouth 3 (three) times daily as needed for muscle spasms.) 90 tablet 5   dorzolamide-timolol (COSOPT) 2-0.5 % ophthalmic solution Place 1 drop into both eyes 2 (two) times daily.     famotidine (PEPCID) 20 MG tablet Take 20 mg by mouth 2 (two) times  daily.     folic acid (FOLVITE) 1 MG tablet Take 1 tablet (1 mg total) by mouth daily. 90 tablet 3   furosemide (LASIX) 40 MG tablet Take 1 tablet (40 mg total) by mouth 2 (two) times daily. 180 tablet 3   gabapentin (NEURONTIN) 300 MG capsule Take 3 capsules (900 mg total) by mouth 3 (three) times daily. 270 capsule 5   hydrocerin (EUCERIN) CREA Apply 1 Application topically 2 (two) times daily.  0   HYDROmorphone (DILAUDID) 4 MG tablet Take 1 tablet (4 mg total) by mouth every 6 (six) hours as needed for severe pain. 120 tablet 0   ibuprofen (ADVIL) 200 MG tablet Take 400 mg by mouth 2 (two) times daily as needed for headache or moderate pain.     latanoprost (XALATAN) 0.005 % ophthalmic solution Place 1 drop into both eyes at bedtime.     magnesium oxide (MAG-OX) 400 MG tablet Take 1 tablet (400 mg total) by mouth at bedtime. (Patient taking differently: Take 200 mg by mouth at bedtime.) 90 tablet 3   metoprolol tartrate (LOPRESSOR) 50 MG tablet Take 1 tablet (50 mg total) by mouth 2 (two) times daily. 180 tablet 3   Multiple Vitamin (MULTIVITAMIN WITH MINERALS) TABS tablet Take 1 tablet by mouth daily. 30 tablet 0   nitrofurantoin, macrocrystal-monohydrate, (MACROBID) 100 MG capsule Take 1 capsule (100 mg total) by mouth 2 (two) times daily  for 7 days. 14 capsule 0   pantoprazole (PROTONIX) 40 MG tablet Take 1 tablet (40 mg total) by mouth daily. 90 tablet 3   potassium chloride (KLOR-CON) 10 MEQ tablet Take 10 mEq by mouth 2 (two) times daily.     promethazine (PHENERGAN) 25 MG tablet Take 25 mg by mouth every 6 (six) hours as needed for nausea or vomiting.     senna-docusate (SENOKOT-S) 8.6-50 MG tablet Take 1 tablet by mouth 2 (two) times daily. (Patient taking differently: Take 1 tablet by mouth 2 (two) times daily as needed for moderate constipation.)     traZODone (DESYREL) 50 MG tablet TAKE 1-2 TABLETS BY MOUTH AT BEDTIME AS NEEDED FOR SLEEP. (Patient taking differently: Take 50 mg by  mouth at bedtime as needed for sleep.) 180 tablet 1   fluticasone (FLONASE) 50 MCG/ACT nasal spray SPRAY 2 SPRAYS INTO EACH NOSTRIL EVERY DAY (Patient not taking: Reported on 02/13/2023) 16 mL 11   No facility-administered medications prior to visit.      PHYSICAL EXAM  GENERAL EXAM/CONSTITUTIONAL: Vitals:  Vitals:   02/13/23 1522  BP: (!) 140/97  Pulse: 81  Weight: 275 lb (124.7 kg)  Height: 5\' 4"  (1.626 m)   Body mass index is 47.2 kg/m. Wt Readings from Last 3 Encounters:  02/13/23 275 lb (124.7 kg)  02/08/23 280 lb 13.9 oz (127.4 kg)  01/28/23 291 lb (132 kg)   Patient is in no distress; well developed, nourished and groomed; neck is supple  CARDIOVASCULAR: Examination of carotid arteries is normal; no carotid bruits Regular rate and rhythm, no murmurs Examination of peripheral vascular system by observation and palpation is normal  EYES: Ophthalmoscopic exam of optic discs and posterior segments is normal; no papilledema or hemorrhages No results found.  MUSCULOSKELETAL: Gait, strength, tone, movements noted in Neurologic exam below  NEUROLOGIC: MENTAL STATUS:      No data to display         awake, alert, oriented to person, place and time recent and remote memory intact normal attention and concentration language fluent, comprehension intact, naming intact fund of knowledge appropriate  CRANIAL NERVE:  2nd - no papilledema on fundoscopic exam 2nd, 3rd, 4th, 6th - pupils equal and reactive to light, visual fields full to confrontation, extraocular muscles intact, no nystagmus 5th - facial sensation symmetric 7th - facial strength symmetric 8th - hearing intact 9th - palate elevates symmetrically, uvula midline 11th - shoulder shrug symmetric 12th - tongue protrusion midline  MOTOR:  normal bulk and tone, full strength in the BUE EXCEPT GRIP (3-4) AND FINGER ABDUCTION (3) BLE --> HIP FLEX 4+, KE / KF 5, DF 5-  SENSORY:  normal and symmetric to  light touch; DECR / ABSENT PP AND VIB IN HANDS AND FEET / ANKLES  COORDINATION:  finger-nose-finger, fine finger movements normal  REFLEXES:  deep tendon reflexes TRACE IN BUE; ABSENT IN BLE  GAIT/STATION:  IN POWERCHAIR     DIAGNOSTIC DATA (LABS, IMAGING, TESTING) - I reviewed patient records, labs, notes, testing and imaging myself where available.  Lab Results  Component Value Date   WBC 18.4 (H) 02/08/2023   HGB 13.2 02/08/2023   HCT 41.8 02/08/2023   MCV 87.8 02/08/2023   PLT 141 (L) 02/08/2023      Component Value Date/Time   NA 139 02/08/2023 0430   K 4.0 02/08/2023 0430   CL 110 02/08/2023 0430   CO2 22 02/08/2023 0430   GLUCOSE 119 (H) 02/08/2023 0430  BUN 21 (H) 02/08/2023 0430   CREATININE 1.11 (H) 02/08/2023 0430   CALCIUM 8.4 (L) 02/08/2023 0430   PROT 6.1 (L) 01/29/2023 1130   ALBUMIN 3.2 (L) 01/29/2023 1130   AST 18 01/29/2023 1130   ALT 13 01/29/2023 1130   ALKPHOS 80 01/29/2023 1130   BILITOT 0.5 01/29/2023 1130   GFRNONAA 58 (L) 02/08/2023 0430   GFRAA >60 05/04/2019 1030   Lab Results  Component Value Date   CHOL 206 (H) 09/05/2022   HDL 47.00 09/05/2022   LDLCALC 135 (H) 09/05/2022   LDLDIRECT 197.7 09/13/2013   TRIG 119.0 09/05/2022   CHOLHDL 4 09/05/2022   Lab Results  Component Value Date   HGBA1C 6.1 09/05/2022   Lab Results  Component Value Date   VITAMINB12 1,809 (H) 09/23/2022   Lab Results  Component Value Date   TSH 3.882 09/23/2022    01/28/23 EMG/NCS (Dr. Terrace Arabia) - This is an abnormal study. There is electrodiagnostic evidence of demyelinating polyradiculoneuropathy, there is no evidence of axonal loss.    ASSESSMENT AND PLAN  57 y.o. year old female here with CIDP, with significant muscle strength improvement following PLEX 01/30/23 - 02/07/23).   Dx:  1. CIDP (chronic inflammatory demyelinating polyneuropathy) (HCC)     PLAN:  CIDP (s/p PLEX; motor improvement noted in BLE) - start maintenance IVIG (1g/kg  IV every 3 weeks; divided over 2 days) - check labs (some leukocytosis over last 2 weeks; has UTI and on abx) - continue PT/OT exercises  Orders Placed This Encounter  Procedures   Immunoglobulins, QN, A/E/G/M   CBC with Differential/Platelet   Comprehensive metabolic panel   Return in about 3 months (around 05/16/2023) for with Dr. Terrace Arabia.    Suanne Marker, MD 02/13/2023, 4:01 PM Certified in Neurology, Neurophysiology and Neuroimaging  [facility] [address] [place], [place] [address] [phone]

## 2023-02-14 LAB — COMPREHENSIVE METABOLIC PANEL
ALT: 33 IU/L — ABNORMAL HIGH (ref 0–32)
BUN/Creatinine Ratio: 16 (ref 9–23)
CO2: 25 mmol/L (ref 20–29)
Sodium: 141 mmol/L (ref 134–144)
Total Protein: 7.2 g/dL (ref 6.0–8.5)
eGFR: 40 mL/min/{1.73_m2} — ABNORMAL LOW (ref >59)

## 2023-02-14 LAB — CBC WITH DIFFERENTIAL/PLATELET
EOS (ABSOLUTE): 0.3 10*3/uL (ref 0.0–0.4)
Eos: 2 %
Hemoglobin: 15.8 g/dL (ref 11.1–15.9)
Immature Granulocytes: 1 %
Lymphs: 26 %
RDW: 12.8 % (ref 11.7–15.4)
WBC: 16.7 10*3/uL — ABNORMAL HIGH (ref 3.4–10.8)

## 2023-02-14 LAB — IMMUNOGLOBULINS A/E/G/M, SERUM

## 2023-02-16 LAB — COMPREHENSIVE METABOLIC PANEL
AST: 21 IU/L (ref 0–40)
Albumin/Globulin Ratio: 2.3 — ABNORMAL HIGH (ref 1.2–2.2)
Albumin: 5 g/dL — ABNORMAL HIGH (ref 3.8–4.9)
Alkaline Phosphatase: 119 IU/L (ref 44–121)
BUN: 24 mg/dL (ref 6–24)
Bilirubin Total: 0.4 mg/dL (ref 0.0–1.2)
Calcium: 9.8 mg/dL (ref 8.7–10.2)
Chloride: 96 mmol/L (ref 96–106)
Creatinine, Ser: 1.52 mg/dL — ABNORMAL HIGH (ref 0.57–1.00)
Globulin, Total: 2.2 g/dL (ref 1.5–4.5)
Glucose: 120 mg/dL — ABNORMAL HIGH (ref 70–99)
Potassium: 5.1 mmol/L (ref 3.5–5.2)

## 2023-02-16 LAB — CBC WITH DIFFERENTIAL/PLATELET
Basophils Absolute: 0.1 10*3/uL (ref 0.0–0.2)
Basos: 1 %
Hematocrit: 47.2 % — ABNORMAL HIGH (ref 34.0–46.6)
Immature Grans (Abs): 0.1 10*3/uL (ref 0.0–0.1)
Lymphocytes Absolute: 4.3 10*3/uL — ABNORMAL HIGH (ref 0.7–3.1)
MCH: 27.5 pg (ref 26.6–33.0)
MCHC: 33.5 g/dL (ref 31.5–35.7)
MCV: 82 fL (ref 79–97)
Monocytes Absolute: 1.1 10*3/uL — ABNORMAL HIGH (ref 0.1–0.9)
Monocytes: 7 %
Neutrophils Absolute: 10.8 10*3/uL — ABNORMAL HIGH (ref 1.4–7.0)
Neutrophils: 63 %
Platelets: 366 10*3/uL (ref 150–450)
RBC: 5.74 x10E6/uL — ABNORMAL HIGH (ref 3.77–5.28)

## 2023-02-16 LAB — IMMUNOGLOBULINS A/E/G/M, SERUM
IgA/Immunoglobulin A, Serum: 157 mg/dL (ref 87–352)
IgG (Immunoglobin G), Serum: 513 mg/dL — ABNORMAL LOW (ref 586–1602)
IgM (Immunoglobulin M), Srm: 58 mg/dL (ref 26–217)

## 2023-02-17 ENCOUNTER — Telehealth: Payer: Self-pay | Admitting: Occupational Therapy

## 2023-02-17 DIAGNOSIS — G6181 Chronic inflammatory demyelinating polyneuritis: Secondary | ICD-10-CM

## 2023-02-17 DIAGNOSIS — R262 Difficulty in walking, not elsewhere classified: Secondary | ICD-10-CM

## 2023-02-17 DIAGNOSIS — R2681 Unsteadiness on feet: Secondary | ICD-10-CM

## 2023-02-17 DIAGNOSIS — R2689 Other abnormalities of gait and mobility: Secondary | ICD-10-CM

## 2023-02-17 DIAGNOSIS — M6281 Muscle weakness (generalized): Secondary | ICD-10-CM

## 2023-02-17 NOTE — Telephone Encounter (Signed)
Dr. Terrace Arabia and Dr. Marjory Lies,  Dana [name] was receiving occupational therapy at our site prior to her hospitalization. She is scheduled to return for  occupational therapy tomorrow, however she does not have updated orders since her hospitalization. If you agree please place new orders for OT so that we can resume care.  Thanks, [employer], OTR/L

## 2023-02-18 ENCOUNTER — Encounter: Payer: Self-pay | Admitting: Physical Therapy

## 2023-02-18 ENCOUNTER — Ambulatory Visit: Payer: 59 | Admitting: Occupational Therapy

## 2023-02-18 ENCOUNTER — Encounter: Payer: Self-pay | Admitting: Occupational Therapy

## 2023-02-18 ENCOUNTER — Ambulatory Visit: Payer: 59 | Attending: Family Medicine | Admitting: Physical Therapy

## 2023-02-18 DIAGNOSIS — R2681 Unsteadiness on feet: Secondary | ICD-10-CM | POA: Diagnosis present

## 2023-02-18 DIAGNOSIS — G610 Guillain-Barre syndrome: Secondary | ICD-10-CM | POA: Insufficient documentation

## 2023-02-18 DIAGNOSIS — G6181 Chronic inflammatory demyelinating polyneuritis: Secondary | ICD-10-CM | POA: Diagnosis present

## 2023-02-18 DIAGNOSIS — R2689 Other abnormalities of gait and mobility: Secondary | ICD-10-CM | POA: Insufficient documentation

## 2023-02-18 DIAGNOSIS — R278 Other lack of coordination: Secondary | ICD-10-CM | POA: Diagnosis not present

## 2023-02-18 DIAGNOSIS — M6281 Muscle weakness (generalized): Secondary | ICD-10-CM | POA: Insufficient documentation

## 2023-02-18 DIAGNOSIS — R262 Difficulty in walking, not elsewhere classified: Secondary | ICD-10-CM | POA: Insufficient documentation

## 2023-02-18 NOTE — Therapy (Addendum)
OUTPATIENT OCCUPATIONAL THERAPY NEURO Treatment/ Re-eval  Patient Name: Dana [name] MRN: [id number] DOB:12-05-1965, 57 y.o., female Today's Date: 02/18/2023  PCP: Dr. Clent Ridges REFERRING PROVIDER: Dr. Gershon Crane  END OF SESSION:  OT End of Session - 02/18/23 0840     Visit Number 10    Number of Visits 35    Date for OT Re-Evaluation 05/13/23    Authorization Type UHC, Medicare    Authorization - Visit Number 10    Progress Note Due on Visit 20    OT Start Time [phone]    OT Stop Time 1015    OT Time Calculation (min) 37 min    Activity Tolerance Patient tolerated treatment well    Behavior During Therapy [facility] for tasks assessed/performed                Past Medical History:  Diagnosis Date   Anxiety    Back pain with radiation    Breast mass, right    Chronic pain    Chronic, continuous use of opioids    Complication of anesthesia 04/07/14   Allergic reaction to Lisinopril immediately following surgery   DJD (degenerative joint disease)    Fibromyalgia    Groin abscess    Headache(784.0)    History of IBS    Hypercholesterolemia    IBS (irritable bowel syndrome)    Lactose intolerance    Mild hypertension    Obesity    Tobacco use disorder    Umbilical hernia    Symptomatic   Past Surgical History:  Procedure Laterality Date   ABDOMINAL HYSTERECTOMY     ANTERIOR CERVICAL DECOMP/DISCECTOMY FUSION N/A 12/20/2015   Procedure: ANTERIOR CERVICAL DECOMPRESSION FUSION CERVICAL 4-5, CERVICAL 5-6, CERVICAL 6-7 WITH INSTRUMENTATION AND ALLOGRAFT;  Surgeon: Estill Bamberg, MD;  Location: [facility] OR;  Service: Orthopedics;  Laterality: N/A;  Anterior cervical decompression fusion, cervical 4-5, cervical 5-6, cervical 6-7 with instrumentation and allograft   BACK SURGERY     BILATERAL SALPINGECTOMY  09/03/2012   Procedure: BILATERAL SALPINGECTOMY;  Surgeon: Ok Edwards, MD;  Location: [facility] ORS;  Service: Gynecology;  Laterality: Bilateral;   BREAST BIOPSY Right 04/07/2014    Procedure: REMOVAL RIGHT BREAST MASS WITH WIRE LOCALIZATION;  Surgeon: Adolph Pollack, MD;  Location: [facility] OR;  Service: General;  Laterality: Right;   BREAST EXCISIONAL BIOPSY Left    x2   BREAST LUMPECTOMY     x2   CARPAL TUNNEL RELEASE Left 05/11/2019   Procedure: LEFT CARPAL TUNNEL RELEASE, RIGHT TENNIS ELBOW MARCAINE/DEPO MEDROL INJECTION UNDER ANESTHESIA;  Surgeon: Kerrin Champagne, MD;  Location: [facility] OR;  Service: Orthopedics;  Laterality: Left;   COLONOSCOPY W/ BIOPSIES  04/24/2012   per Dr. Leone Payor, clear, repeat in 10 yrs    disectomy     ESOPHAGOGASTRODUODENOSCOPY     FINGER SURGERY     Right index-excision of mass    FOOT SURGERY Right    Bone Spurs   IR FLUORO GUIDE CV LINE RIGHT  01/29/2023   IR REMOVAL TUN CV CATH W/O FL  02/08/2023   IR US GUIDE VASC ACCESS RIGHT  01/31/2023   LAPAROSCOPIC HYSTERECTOMY  09/03/2012   Procedure: HYSTERECTOMY TOTAL LAPAROSCOPIC;  Surgeon: Ok Edwards, MD;  Location: [facility];  Service: Gynecology;  Laterality: N/A;   LUMBAR DISC SURGERY     TUBAL LIGATION     UMBILICAL HERNIA REPAIR N/A 05/01/2018   Procedure: UMBILICAL HERNIA REPAIR;  Surgeon: Jimmye Norman, MD;  Location: [facility] OR;  Service: General;  Laterality: N/A;   Patient Active Problem List   Diagnosis Date Noted   CIDP (chronic inflammatory demyelinating polyneuropathy) (HCC) 01/28/2023   Gait abnormality 01/28/2023   Glaucoma 01/28/2023   Pain due to onychomycosis of toenails of both feet 01/21/2023   Wheelchair dependence 12/30/2022   Nerve pain 12/30/2022   Insomnia due to medical condition 12/30/2022   Acute inflammatory demyelinating polyneuropathy (HCC) 10/01/2022   UTI (urinary tract infection) 09/24/2022   Hypomagnesemia 09/24/2022   Weakness 09/23/2022   Hypokalemia 09/23/2022   B12 deficiency 09/05/2022   Folate deficiency 09/05/2022   Carpal tunnel syndrome, left upper limb 05/11/2019    Class: Chronic   Spinal stenosis of lumbar region with neurogenic claudication  08/20/2018   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   Primary osteoarthritis of right knee 02/27/2017   Right tennis elbow 11/11/2016   Muscle cramps 01/15/2016   Bilateral leg edema 01/08/2016   Radiculopathy 12/20/2015   Hyperglycemia 12/21/2014   Mastodynia, female 11/30/2014   S/P excision of fibroadenoma of breast 11/30/2014   Angioedema of lips 04/07/2014   Fibroadenoma of right breast 03/07/2014   Pelvic pain in female 06/19/2012   Menorrhagia 06/11/2012   SUI (stress urinary incontinence, female) 06/11/2012   HTN (hypertension) 06/11/2012   IBS (irritable bowel syndrome) - diarrhea predominant 03/17/2012   MICROSCOPIC HEMATURIA 11/30/2010   BREAST PAIN, RIGHT 11/30/2010   BREAST MASS, RIGHT 01/12/2010   HYPERCHOLESTEROLEMIA 12/07/2007   CIGARETTE SMOKER 12/07/2007   DEGENERATIVE JOINT DISEASE 12/07/2007   Low back pain with sciatica 12/07/2007   HEADACHE 12/07/2007   Obesity, Class III, BMI 40-49.9 (morbid obesity) (HCC) 08/03/2007   Anxiety state 08/03/2007    ONSET DATE: 12/05/22- referral date  REFERRING DIAG: G61.0 (ICD-10-CM) - Guillain Barr syndrome (HCC)  THERAPY DIAG:    Rationale for Evaluation and Treatment: Rehabilitation  SUBJECTIVE:   SUBJECTIVE STATEMENT: Pt reports she is doing better since hospitalization, however she is a little overwhelmed. PERTINENT HISTORY: Pt is a 57 y/o female hospitalized 09/23/22  with progressive weakness and sensation changes, consistent with Guillain-Barr syndrome. MRI negative. LP results and assessment most consistent with Guillain-Barr syndrome.  Pt was d/c home from CIR 11/06/22 PMH includes: glaucoma anxiety, DJD, fibromyalgia, HTN, tobacco use, B-12 deficiency, OA, ACDF 2017.  Pt was re-hospitalized 01/28/2456 with CIDP (chronic inflammatory demyelinating polyneuropathy). Saw Neuro on day of admission, NCS showed demyelination, referred to hospital for PLEX. Initially diagnosed with an acute demyelinating  neuropathy Jan 2024, treated with IVIG. PMH: Spinal stenosis, HTN, Obesity   PRECAUTIONS: Fall  WEIGHT BEARING RESTRICTIONS: no  PAIN:  Are you having pain? Yes:  NPRS scale: 3/10 Pain location: L knee Pain description: aching Aggravating factors: standing Relieving factors: rest    FALLS: Has patient fallen in last 6 months? No  LIVING ENVIRONMENT: Lives with: lives with their spouse Lives in: House/apartment Stairs: has ramp built Has following equipment at home:   BSC, steadi,  tub bench but can't access bathroom   PLOF: Independent  PATIENT GOALS: increase independence   OBJECTIVE:   HAND DOMINANCE: Right  ADLs: from initial eval Overall ADLs: assistance required, unable to access BR Transfers/ambulation related to ADLs: Eating: unable to cut food Grooming: set up UB Dressing: set up for bra and shirt LB Dressing: max A Toileting: uses BSC uses steadi Bathing: min-mod A in hospital  bed  Tub Shower transfers: n/a Equipment: Emergency planning/management officer  IADLs:dependent for IADLS   MOBILITY STATUS:  uses w/c  ,  per PT report minguard for transfer from w/c to mat    ACTIVITY TOLERANCE: Activity tolerance: Pt fatigues quickly    UPPER EXTREMITY ROM:  see updates for 02/18/23 re-eval   Active ROM Right 02/18/23 Left 02/18/23  Shoulder flexion 130 120  Shoulder abduction 90 90  Shoulder adduction    Shoulder extension    Shoulder internal rotation    Shoulder external rotation    Elbow flexion    Elbow extension  -5  Wrist flexion    Wrist extension    Wrist ulnar deviation    Wrist radial deviation    Wrist pronation    Wrist supination    (Blank rows = not tested)  UPPER EXTREMITY MMT:   see below for re-eval 02/18/23  MMT Right 02/18/23 Left 02/18/23  Shoulder flexion 3+/5 3+/5  Shoulder abduction    Shoulder adduction    Shoulder extension    Shoulder internal rotation    Shoulder external rotation    Middle trapezius    Lower trapezius    Elbow  flexion 4/5 4/5  Elbow extension 4-/5 4-/5  Wrist flexion    Wrist extension    Wrist ulnar deviation    Wrist radial deviation    Wrist pronation    Wrist supination      HAND FUNCTION: Grip strength: Right: 32 lbs; Left: 18 lbs, 02/18/23 R 55 lbs, L 35 lbs  COORDINATION: 9 Hole Peg test:initial eval  Right: 1 min 48 sec; Left: 4 pegs in 2 mins  Box and Blocks:  Right 35 blocks, Left  26 blocks 02/18/23- RUE 35.46, LUE 65 secs  SENSATION: Light touch: WFL Hot/Cold: Not tested    COGNITION: Overall cognitive status:NT   VISION: Subjective report: reports hx of glaucoma VISION ASSESSMENT: Not tested  OBSERVATIONS: Pt is eager to improve, pt's husband is very supportive   TODAY'S TREATMENT:                                                                                                                              DATE: 02/18/23- re-eval performed and therapist discussed potential goals with patient.  PATIENT EDUCATION: Education details: potential updated goals, progress towards goals Person educated: Patient Education method: explanation,  Education comprehension: verbalized understanding,   HOME EXERCISE PROGRAM:  12/17/22- coordination HEP, cane exercises seated   GOALS: Goals reviewed with patient? Yes  SHORT TERM GOALS: Target date: 01/11/23  I with initial HEP Baseline: Goal status: met 01/21/23  2.  Pt will demonstrate improved UE functional use as evidenced increasing bilateral box/ blocks score by 4 blocks.  Baseline: R 35, L 26 Goal status  met 12/31/22 RUE :43:  LUE 35    3.  Pt will donn pants with mod A. Baseline: max A Goal status:met  02/18/23- pt performed mod I today  4. Pt will verbalize understanding of adapted strategies to maximize safety and I with ADLs/ IADLs .  Goal status:  ongoing, 02/18/23  5.  Pt will demonstrate ability to stand for functional activity with min A x 10 mins in prep for ADLs.  Goal status: ongoing, pt stood for 1 min  today, 02/18/23  6.  Pt will perform w/c to [facility] transfers mod I  Goal status:  met, 02/18/23 7. I with updated HEP  Goal status- new  8.Pt will improve LUE grip strength by 8 lbs for increased ease with ADLs.  Goal status-new 9. Pt will perform tub transfer and shower with min A  Goal status: new   LONG TERM GOALS: Target date: 03/06/23  I with updated HEP  Goal status: ongoing, 02/18/23  2.  Pt will perform LB dressing mod I. Baseline:max A Goal status: ongoing min A with LB dressing 02/18/23  3.  Pt will bathe with supervision/ set up. Baseline: min-mod A for sponge bath Goal status: met for sponge bathing at set up   4.  Pt will perform basic cooking at a w/c level mod I  Revised goal: Pt will perform basic cooking modified independently at a walker level.  Goal status:  met, 02/18/23  pt has performed simple tasks mod I w/c level, goal revised   5.  Pt will demonstrate improved fine motor coordination as evidenced by performing LUE 9 hole peg test in 2 mins or less.  Revised goal:Pt will demonstrate improved fine motor coordination as evidenced by performing 9 hole peg test in 55 secs or less  Goal status: initial goal met 6/4/246/4/24-  LUE 65 secs , goal revised  6.  Pt will demonstrate improved fine motor coordination as evidenced by performing RUE 9 hole peg test in 1 mins  30 secs or less.  Baseline: RUE  1 min 48 secs, LUE 4 pegs placed in 2 mins  Goal status: met 6/4/246/4/24- RUE 35.46,     7. Pt will perform shower with supervision using tub transfer bench    Goal status: new   8. Pt will perform basic home management at a walker level mod I    Goal status: new ASSESSMENT:  CLINICAL IMPRESSION: 57 yo hospitalized 01/28/23 with  CIDP (chronic inflammatory demyelinating polyneuropathy). Saw Neurologist on day of admission, NCS showed demyelination, referred to hospital for PLEX.  Pt was previously receiving occupational therapy prior to hospitalization. She returns  today for re-evaluation and update of goals. See goals above for progress. PERFORMANCE DEFICITS: in functional skills including ADLs, IADLs, coordination, dexterity, sensation, ROM, strength, flexibility, Fine motor control, Gross motor control, mobility, balance, endurance, decreased knowledge of precautions, decreased knowledge of use of DME, and UE functional use, cognitive skills including  and psychosocial skills including coping strategies, environmental adaptation, habits, interpersonal interactions, and routines and behaviors.   IMPAIRMENTS: are limiting patient from ADLs, IADLs, rest and sleep, play, leisure, and social participation.   CO-MORBIDITIES: may have co-morbidities  that affects occupational performance. Patient will benefit from skilled OT to address above impairments and improve overall function.  MODIFICATION OR ASSISTANCE TO COMPLETE EVALUATION: No modification of tasks or assist necessary to complete an evaluation.  OT OCCUPATIONAL PROFILE AND HISTORY: Detailed assessment: Review of records and additional review of physical, cognitive, psychosocial history related to current functional performance.  CLINICAL DECISION MAKING: LOW - limited treatment options, no task modification necessary  REHAB POTENTIAL: Good  EVALUATION COMPLEXITY: Low    PLAN:  OT FREQUENCY: 2x/week  OT DURATION: 12 weeks  PLANNED INTERVENTIONS: self care/ADL training, therapeutic exercise, therapeutic activity, neuromuscular re-education, manual  therapy, passive range of motion, gait training, balance training, stair training, functional mobility training, aquatic therapy, ultrasound, paraffin, moist heat, cryotherapy, patient/family education, energy conservation, coping strategies training, DME and/or AE instructions, and Re-evaluation  RECOMMENDED OTHER SERVICES: PT  CONSULTED AND AGREED WITH PLAN OF CARE: Patient and family member/caregiver  PLAN FOR NEXT SESSION:  work towards updated  goals  Gabriel Conry, OT 02/18/2023, 11:45 AM

## 2023-02-18 NOTE — Therapy (Signed)
OUTPATIENT PHYSICAL THERAPY LOWER EXTREMITY TREATMENT   Patient Name: Dana [name] MRN: [id number] DOB:May 08, 1966, 57 y.o., female Today's Date: 02/18/2023  END OF SESSION:  PT End of Session - 02/18/23 0845     Visit Number 9    Date for PT Re-Evaluation 04/15/23    PT Start Time 0840    PT Stop Time 0925    PT Time Calculation (min) 45 min    Activity Tolerance Patient tolerated treatment well    Behavior During Therapy [facility] for tasks assessed/performed              Past Medical History:  Diagnosis Date   Anxiety    Back pain with radiation    Breast mass, right    Chronic pain    Chronic, continuous use of opioids    Complication of anesthesia 04/07/14   Allergic reaction to Lisinopril immediately following surgery   DJD (degenerative joint disease)    Fibromyalgia    Groin abscess    Headache(784.0)    History of IBS    Hypercholesterolemia    IBS (irritable bowel syndrome)    Lactose intolerance    Mild hypertension    Obesity    Tobacco use disorder    Umbilical hernia    Symptomatic   Past Surgical History:  Procedure Laterality Date   ABDOMINAL HYSTERECTOMY     ANTERIOR CERVICAL DECOMP/DISCECTOMY FUSION N/A 12/20/2015   Procedure: ANTERIOR CERVICAL DECOMPRESSION FUSION CERVICAL 4-5, CERVICAL 5-6, CERVICAL 6-7 WITH INSTRUMENTATION AND ALLOGRAFT;  Surgeon: Estill Bamberg, MD;  Location: [facility] OR;  Service: Orthopedics;  Laterality: N/A;  Anterior cervical decompression fusion, cervical 4-5, cervical 5-6, cervical 6-7 with instrumentation and allograft   BACK SURGERY     BILATERAL SALPINGECTOMY  09/03/2012   Procedure: BILATERAL SALPINGECTOMY;  Surgeon: Ok Edwards, MD;  Location: [facility] ORS;  Service: Gynecology;  Laterality: Bilateral;   BREAST BIOPSY Right 04/07/2014   Procedure: REMOVAL RIGHT BREAST MASS WITH WIRE LOCALIZATION;  Surgeon: Adolph Pollack, MD;  Location: [facility] OR;  Service: General;  Laterality: Right;   BREAST EXCISIONAL BIOPSY Left    x2    BREAST LUMPECTOMY     x2   CARPAL TUNNEL RELEASE Left 05/11/2019   Procedure: LEFT CARPAL TUNNEL RELEASE, RIGHT TENNIS ELBOW MARCAINE/DEPO MEDROL INJECTION UNDER ANESTHESIA;  Surgeon: Kerrin Champagne, MD;  Location: [facility] OR;  Service: Orthopedics;  Laterality: Left;   COLONOSCOPY W/ BIOPSIES  04/24/2012   per Dr. Leone Payor, clear, repeat in 10 yrs    disectomy     ESOPHAGOGASTRODUODENOSCOPY     FINGER SURGERY     Right index-excision of mass    FOOT SURGERY Right    Bone Spurs   IR FLUORO GUIDE CV LINE RIGHT  01/29/2023   IR REMOVAL TUN CV CATH W/O FL  02/08/2023   IR US GUIDE VASC ACCESS RIGHT  01/31/2023   LAPAROSCOPIC HYSTERECTOMY  09/03/2012   Procedure: HYSTERECTOMY TOTAL LAPAROSCOPIC;  Surgeon: Ok Edwards, MD;  Location: [facility] OR[facility];  Service: Gynecology;  Laterality: N/A;   LUMBAR DISC SURGERY     TUBAL LIGATION     UMBILICAL HERNIA REPAIR N/A 05/01/2018   Procedure: UMBILICAL HERNIA REPAIR;  Surgeon: Jimmye Norman, MD;  Location: [facility] OR;  Service: General;  Laterality: N/A;   Patient Active Problem List   Diagnosis Date Noted   CIDP (chronic inflammatory demyelinating polyneuropathy) (HCC) 01/28/2023   Gait abnormality 01/28/2023   Glaucoma 01/28/2023   Pain due to onychomycosis of  toenails of both feet 01/21/2023   Wheelchair dependence 12/30/2022   Nerve pain 12/30/2022   Insomnia due to medical condition 12/30/2022   Acute inflammatory demyelinating polyneuropathy (HCC) 10/01/2022   UTI (urinary tract infection) 09/24/2022   Hypomagnesemia 09/24/2022   Weakness 09/23/2022   Hypokalemia 09/23/2022   B12 deficiency 09/05/2022   Folate deficiency 09/05/2022   Carpal tunnel syndrome, left upper limb 05/11/2019    Class: Chronic   Spinal stenosis of lumbar region with neurogenic claudication 08/20/2018   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   Primary osteoarthritis of right knee 02/27/2017   Right tennis elbow 11/11/2016   Muscle cramps 01/15/2016   Bilateral leg edema  01/08/2016   Radiculopathy 12/20/2015   Hyperglycemia 12/21/2014   Mastodynia, female 11/30/2014   S/P excision of fibroadenoma of breast 11/30/2014   Angioedema of lips 04/07/2014   Fibroadenoma of right breast 03/07/2014   Pelvic pain in female 06/19/2012   Menorrhagia 06/11/2012   SUI (stress urinary incontinence, female) 06/11/2012   HTN (hypertension) 06/11/2012   IBS (irritable bowel syndrome) - diarrhea predominant 03/17/2012   MICROSCOPIC HEMATURIA 11/30/2010   BREAST PAIN, RIGHT 11/30/2010   BREAST MASS, RIGHT 01/12/2010   HYPERCHOLESTEROLEMIA 12/07/2007   CIGARETTE SMOKER 12/07/2007   DEGENERATIVE JOINT DISEASE 12/07/2007   Low back pain with sciatica 12/07/2007   HEADACHE 12/07/2007   Obesity, Class III, BMI 40-49.9 (morbid obesity) (HCC) 08/03/2007   Anxiety state 08/03/2007    PCP: Nelwyn Salisbury, MD  REFERRING PROVIDER: Nelwyn Salisbury, MD  REFERRING DIAG: G61.0 (ICD-10-CM) - Guillain Barr syndrome [facility])   THERAPY DIAG:  Other lack of coordination  Muscle weakness (generalized)  Unsteadiness on feet  Difficulty in walking, not elsewhere classified  Other abnormalities of gait and mobility  CIDP (chronic inflammatory demyelinating polyneuropathy) (HCC)  Rationale for Evaluation and Treatment: Rehabilitation  ONSET DATE: 12/05/22  SUBJECTIVE:   SUBJECTIVE STATEMENT: Patient reports she is making it. She is tired, but is stronger. Diagnosed with CIDP.  OBJECTIVE STATEMENT: 01/28/23 ASSESSMENT AND PLAN   Dana [name] is a 57 y.o. female   Demyelinating polyradiculoneuropathy             Symptom onset monophasic since September 2024, failed to respond to IVIG in January 2024,             EMG nerve conduction study showed demyelinating features, no evidence of axonal loss,             Talk with neurohospitalist Dr. Iver Nestle, ED triage, will send her for hospital admission for plasma exchange, please add on following labs, immunofixative protein  electrophoresis, ANA with reflex, SSA, SSB titer, iron panel             Starting outpatient IVIG prior authorization process,  Dana [name] is a 57 y.o. female who presented to the [facility] ED on 09/23/2022 with bilateral lower extremity weakness with decreased mobility that has progressed to both arms. She also reported numbness and tingling of the extremities. She was transferred to [facility] for MRI. 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  Inpatient Rehab F/B HHPT.  R knee injection 10/08/22 trigger point injections for worsening back pain 2/15  RA  PAIN:  Are you having pain? Yes: NPRS scale: up to 10/10 Pain location: all over, but her R knee pain is worst,  Pain description: Sharp Aggravating factors: Has difficulty standing up Relieving factors: Medicine helps somewhat  PRECAUTIONS: Fall  WEIGHT BEARING RESTRICTIONS: No  FALLS:  Has patient fallen in last 6 months? No  LIVING ENVIRONMENT: Lives with: lives with their family Lives in: House/apartment Stairs: Nohas a ramp Has following equipment at home: Environmental consultant - 2 wheeled, Wheelchair (power), Tour manager, bed side commode, Ramped entry, and [employer] lift  OCCUPATION: on disability due to RA and a fall several years ago  PLOF: Independent  PATIENT GOALS: walk  NEXT MD VISIT: She is scheduled to follow up with Dr. Serita Sheller with Physical Med/Rehab on 12-30-22. She is scheduled to follow up with Dr. Levert Feinstein at Neurology on 01-28-23.   OBJECTIVE:   DIAGNOSTIC FINDINGS:  Right knee radiographs 11/09/2020   FINDINGS: Severe patellofemoral joint space narrowing. Severe superior and mild inferior patellar degenerative osteophytosis. Moderate superior trochlear degenerative osteophytosis. No joint effusion. Severe medial compartment joint space narrowing with moderate peripheral medial and lateral compartment degenerative osteophytes.  COGNITION: Overall cognitive status: Within functional limits for  tasks assessed     SENSATION: Light touch: Impaired  and B hands and feet and lower legs  EDEMA:  BLE edema noted.  MUSCLE LENGTH: Hamstrings: WFL Thomas test: B tightness, to neutral  POSTURE: rounded shoulders and flexed trunk   PALPATION: No TTP  LOWER EXTREMITY ROM: BLE ROM limited in all planes an all joints due to body habitus   LOWER EXTREMITY MMT:  MMT Right eval Left eval  Hip flexion 3 3-  Hip extension 3- 3-  Hip abduction 3 3-  Hip adduction    Hip internal rotation    Hip external rotation    Knee flexion 4 4-  Knee extension 4- 4-  Ankle dorsiflexion 4- 4-  Ankle plantarflexion    Ankle inversion 4- 4-  Ankle eversion 4- 4-   (Blank rows = not tested)  FUNCTIONAL TESTS:  5 times sit to stand: unable to complete Timed up and go (TUG): unable to complete  Bed Mobility: Occasional min A for sup <> sit, rolled B on mat with great effort, light min A  TRANSFERS: Scooting transfer with CGA, stand pivot with CGA, she reports that she sometimes has difficulty rising due to her knee pain  GAIT: Distance walked: 0' Assistive device utilized:  shopping cart-will bring her RW from home. Level of assistance: Min A Comments: Stood and weight shifted, then stood again and took steps to turn to W/C.  TODAY'S TREATMENT:                                                                                                                              DATE:  02/18/23 Patient's functional status re-assessed after hospitalization-See goals for current status. NuStep L4 x 6 minutes  01/23/23 NuStep L5 x 6 minutes Transfers <> mat and NuStep with CGA for safety, no physical assistance required Supine with physioball- Rolling DKTC with gentle push for stretch and stability, Bridge, Bridge with B hip IR, quick LTR to activate abdominals. 10 reps each Heel slides x5  LLE Bridges x10  Standing with RW - 10s stands without any support x2  Small marches with RW 2x5  Transfer  mat <>WC stand pivot with minA 2 people help Seated cable rows and ext 10# 2x10    01/21/23 B (15) and then single limb (2 x 10) pushes on Fitter Seated shoulder ext, rows, trunk rotation, all with G tband resistance, 10 reps each. Parallel bars, standing weight shifts, Heel raises x 10, sliding B hands on rail Single steps forward and back with alternating legs, RLE had partial buckle, required min A for stability, with a lot of pain for patient.  01/16/23 NuStep L5 x 6 minutes then L7 legs only x 2 minutes. Seated shoulder ext and rows with 10#, 2 x 10 reps each Seated chest press against 5# x 10 reps Scooting transfers to each side with S. Seated rotation to R, slide hands out, lift L leg onto the mat, then return. 3 x each direction. Ambulation x 8 steps with RW and min A. She had more difficulty stepping with LLE due to R knee pain today.  PATIENT EDUCATION:  Education details: POC Person educated: Patient Education method: Explanation Education comprehension: verbalized understanding  HOME EXERCISE PROGRAM: TBD  ASSESSMENT:  CLINICAL IMPRESSION: Patient returns for re-assessment and UPOC after recent hospitalization with new diagnosis of Chronic Inflammatory Demyelinating Polyneuropathy. She is stronger than she was pre-re admission, and she received B knee injections, so her knee pain is better controlled. Goals and ELS both updated to accurately reflect her new level of mobility.  OBJECTIVE IMPAIRMENTS: Abnormal gait, decreased activity tolerance, decreased balance, decreased coordination, decreased endurance, difficulty walking, decreased ROM, decreased strength, and postural dysfunction.   ACTIVITY LIMITATIONS: carrying, lifting, bending, standing, squatting, stairs, transfers, and locomotion level  PARTICIPATION LIMITATIONS: meal prep, cleaning, laundry, driving, and shopping  PERSONAL FACTORS: Fitness, Past/current experiences, and 1 comorbidity: Recently diagnosed with  GBS  are also affecting patient's functional outcome.   REHAB POTENTIAL: Good  CLINICAL DECISION MAKING: Evolving/moderate complexity  EVALUATION COMPLEXITY: Moderate   GOALS: Goals reviewed with patient? Yes  SHORT TERM GOALS: Target date: 11/28/22 I with initial HEP Baseline: Goal status: met 01/09/23  LONG TERM GOALS: Target date: 03/06/23  I with final HEP Baseline:  Goal status: INITIAL  2.  Decrease 5x STS to < 14 sec Baseline: unable to complete Goal status: 02/18/23-39 sec-ongoing  3.  Decrease TUG to < 20 sec Baseline: Unable to complete. Goal status: 02/18/23 1:55 Ongoing  4.  Patient will ambulate at least 300' with LRAD, MI, on level and unlevel surfaces. Baseline: Stand pivot transfer with CGA Goal status: 02/18/23-20', RW, CGA, slow, but stable throughout. Ongoing  5.  Perform all bed mobility with I Baseline: Occ min A on mat Goal status: 02/18/23-met  6.  Increase BLE strength to at least 4/5 throughout. Baseline: (3-)-3/5 Goal status: 02/18/23-RLE 4-/5, LLE 3+/5, Ongoing  7. Patient and husband will safely perform car transfers with LRAD Baseline: Currently uses van transportation Goal Status: Initial   PLAN:  PT FREQUENCY: 1-2x/week  PT DURATION: 12 weeks  PLANNED INTERVENTIONS: Therapeutic exercises, Therapeutic activity, Neuromuscular re-education, Balance training, Gait training, Patient/Family education, Self Care, Joint mobilization, Stair training, Dry Needling, Electrical stimulation, Cryotherapy, Moist heat, Ultrasound, Ionotophoresis 4mg /ml Dexamethasone, and Manual therapy  PLAN FOR NEXT SESSION: Initiate HEP  Oley Balm DPT 02/18/23 9:32 AM

## 2023-02-18 NOTE — Telephone Encounter (Signed)
Dr Marjory Lies are you willing to order or wait until Dr Terrace Arabia is back ?

## 2023-02-18 NOTE — Telephone Encounter (Signed)
Order placed for OT °

## 2023-02-19 ENCOUNTER — Ambulatory Visit (INDEPENDENT_AMBULATORY_CARE_PROVIDER_SITE_OTHER): Payer: 59 | Admitting: Family Medicine

## 2023-02-19 ENCOUNTER — Encounter: Payer: Self-pay | Admitting: Family Medicine

## 2023-02-19 VITALS — BP 130/84 | HR 68 | Temp 98.1°F | Wt 279.0 lb

## 2023-02-19 DIAGNOSIS — I10 Essential (primary) hypertension: Secondary | ICD-10-CM

## 2023-02-19 DIAGNOSIS — Z993 Dependence on wheelchair: Secondary | ICD-10-CM

## 2023-02-19 DIAGNOSIS — N3000 Acute cystitis without hematuria: Secondary | ICD-10-CM | POA: Diagnosis not present

## 2023-02-19 DIAGNOSIS — G8929 Other chronic pain: Secondary | ICD-10-CM

## 2023-02-19 DIAGNOSIS — D72829 Elevated white blood cell count, unspecified: Secondary | ICD-10-CM

## 2023-02-19 DIAGNOSIS — G6181 Chronic inflammatory demyelinating polyneuritis: Secondary | ICD-10-CM | POA: Diagnosis not present

## 2023-02-19 DIAGNOSIS — M5440 Lumbago with sciatica, unspecified side: Secondary | ICD-10-CM

## 2023-02-19 DIAGNOSIS — M48062 Spinal stenosis, lumbar region with neurogenic claudication: Secondary | ICD-10-CM

## 2023-02-19 MED ORDER — HYDROMORPHONE HCL 8 MG PO TABS: 8.0000 mg | ORAL_TABLET | Freq: Four times a day (QID) | ORAL | 0 refills | Status: DC | PRN

## 2023-02-19 NOTE — Progress Notes (Signed)
   Subjective:    Patient ID: Dana [name], female    DOB: 01/06/1966, 57 y.o.   MRN: [id number]  HPI Here with her husband for a transitional care visit to follow up a hospital stay from 01-28-23 to 5-25 for treatment of generalized weakness that was diagnosed as chronic inflammatory demyelinating polyneuropathy (CIDP). She was being treated for apparent Gullain-Barre syndrome prior to that with IVIG treatments. She was then admitted to receive plasmaphoresis treaments. Towards the end of the stay she was diagnosed with a UTI and her WBC went up to 18.4. Her urine culture grew Hafnia alvei and she was treated with 7 days of Macrobid. The bacteria was shown to be sensitive to this. She now feels like the infection is gone because her urine is clear. She had labs drawn on 02-13-23 which showed the WBC to have improved slightly to 16.7. She saw Dr. Marjory Lies in the Neurology clinic on 02-13-23, and the plan is for her to continue getting PT and OT and they will begin another round of IVIG treatments. Her husband, who is caring for Kaylanni and his mother, asks for assistance at home with dressing, bathing, etc. We have also been treating Minh with Hydromorphone 4 mg every 6 hours as needed for low back pain, and she asks if the dosage can be increased. She finds the therapy to be very painful.    Review of Systems  Constitutional: Negative.   Respiratory: Negative.    Cardiovascular: Negative.   Gastrointestinal: Negative.   Genitourinary: Negative.   Musculoskeletal:  Positive for back pain.  Neurological:  Positive for weakness.       Objective:   Physical Exam Constitutional:      Comments: In a motorized scooter   Cardiovascular:     Rate and Rhythm: Normal rate and regular rhythm.     Pulses: Normal pulses.     Heart sounds: Normal heart sounds.  Pulmonary:     Effort: Pulmonary effort is normal.     Breath sounds: Normal breath sounds.  Neurological:     Mental Status: She is alert  and oriented to person, place, and time.           Assessment & Plan:  She is in the midst of treatment for CIDP, and she will continue with PT and OT. She will receive IVIG treatments per Neurology. Her UTI is apparently resolved but we will follow the leukocytosis. We will extend the PT and OT orders. We will consult Home Health to assist in the home. For her chronic pain, we will increase the Hydromorphone to 8 mg every 6 hours as needed. She will see [place] again next month for a pain management visit. We spent a total of ( 35  ) minutes reviewing records and discussing these issues.  Gershon Crane, MD

## 2023-02-20 ENCOUNTER — Ambulatory Visit: Payer: 59 | Admitting: Physical Therapy

## 2023-02-20 ENCOUNTER — Ambulatory Visit: Payer: 59 | Admitting: Occupational Therapy

## 2023-02-21 ENCOUNTER — Other Ambulatory Visit (HOSPITAL_COMMUNITY): Payer: Self-pay

## 2023-02-21 NOTE — Addendum Note (Signed)
Addended by: Carola Rhine on: 02/21/2023 04:28 PM   Modules accepted: Orders

## 2023-02-24 ENCOUNTER — Telehealth: Payer: Self-pay | Admitting: Family Medicine

## 2023-02-24 ENCOUNTER — Other Ambulatory Visit (HOSPITAL_COMMUNITY): Payer: Self-pay

## 2023-02-24 NOTE — Telephone Encounter (Signed)
Pt was provided with information for [facility]. Pt pharmacy does not have Hydromorphone in stock

## 2023-02-24 NOTE — Telephone Encounter (Signed)
Requesting a call regarding her HYDROmorphone (DILAUDID) 8 MG tablet. Says he can't find the number to the pharmacy she was given and she needs her meds

## 2023-02-25 ENCOUNTER — Ambulatory Visit: Payer: 59 | Admitting: Occupational Therapy

## 2023-02-25 ENCOUNTER — Ambulatory Visit: Payer: 59 | Admitting: Physical Therapy

## 2023-02-25 ENCOUNTER — Encounter: Payer: Self-pay | Admitting: Physical Therapy

## 2023-02-25 DIAGNOSIS — R262 Difficulty in walking, not elsewhere classified: Secondary | ICD-10-CM

## 2023-02-25 DIAGNOSIS — R278 Other lack of coordination: Secondary | ICD-10-CM | POA: Diagnosis not present

## 2023-02-25 DIAGNOSIS — M6281 Muscle weakness (generalized): Secondary | ICD-10-CM

## 2023-02-25 DIAGNOSIS — R2681 Unsteadiness on feet: Secondary | ICD-10-CM

## 2023-02-25 DIAGNOSIS — G6181 Chronic inflammatory demyelinating polyneuritis: Secondary | ICD-10-CM

## 2023-02-25 DIAGNOSIS — G610 Guillain-Barre syndrome: Secondary | ICD-10-CM

## 2023-02-25 DIAGNOSIS — R2689 Other abnormalities of gait and mobility: Secondary | ICD-10-CM

## 2023-02-25 NOTE — Therapy (Signed)
OUTPATIENT OCCUPATIONAL THERAPY NEURO Treatment  Patient Name: Dana [name] MRN: [id number] DOB:1966-04-13, 57 y.o., female Today's Date: 02/25/2023  PCP: Dr. Clent Ridges REFERRING PROVIDER: Dr. Gershon Crane  END OF SESSION:  OT End of Session - 02/25/23 0950     Visit Number 11    Number of Visits 35    Date for OT Re-Evaluation 05/13/23    Authorization Type UHC, Medicare    Authorization - Visit Number 10    Progress Note Due on Visit 20    OT Start Time [phone]    OT Stop Time 1013    OT Time Calculation (min) 40 min    Activity Tolerance Patient tolerated treatment well    Behavior During Therapy [facility] for tasks assessed/performed                Past Medical History:  Diagnosis Date   Anxiety    Back pain with radiation    Breast mass, right    Chronic pain    Chronic, continuous use of opioids    Complication of anesthesia 04/07/14   Allergic reaction to Lisinopril immediately following surgery   DJD (degenerative joint disease)    Fibromyalgia    Groin abscess    Headache(784.0)    History of IBS    Hypercholesterolemia    IBS (irritable bowel syndrome)    Lactose intolerance    Mild hypertension    Obesity    Tobacco use disorder    Umbilical hernia    Symptomatic   Past Surgical History:  Procedure Laterality Date   ABDOMINAL HYSTERECTOMY     ANTERIOR CERVICAL DECOMP/DISCECTOMY FUSION N/A 12/20/2015   Procedure: ANTERIOR CERVICAL DECOMPRESSION FUSION CERVICAL 4-5, CERVICAL 5-6, CERVICAL 6-7 WITH INSTRUMENTATION AND ALLOGRAFT;  Surgeon: Estill Bamberg, MD;  Location: [facility] OR;  Service: Orthopedics;  Laterality: N/A;  Anterior cervical decompression fusion, cervical 4-5, cervical 5-6, cervical 6-7 with instrumentation and allograft   BACK SURGERY     BILATERAL SALPINGECTOMY  09/03/2012   Procedure: BILATERAL SALPINGECTOMY;  Surgeon: Ok Edwards, MD;  Location: [facility] ORS;  Service: Gynecology;  Laterality: Bilateral;   BREAST BIOPSY Right 04/07/2014    Procedure: REMOVAL RIGHT BREAST MASS WITH WIRE LOCALIZATION;  Surgeon: Adolph Pollack, MD;  Location: [facility] OR;  Service: General;  Laterality: Right;   BREAST EXCISIONAL BIOPSY Left    x2   BREAST LUMPECTOMY     x2   CARPAL TUNNEL RELEASE Left 05/11/2019   Procedure: LEFT CARPAL TUNNEL RELEASE, RIGHT TENNIS ELBOW MARCAINE/DEPO MEDROL INJECTION UNDER ANESTHESIA;  Surgeon: Kerrin Champagne, MD;  Location: [facility] OR;  Service: Orthopedics;  Laterality: Left;   COLONOSCOPY W/ BIOPSIES  04/24/2012   per Dr. Leone Payor, clear, repeat in 10 yrs    disectomy     ESOPHAGOGASTRODUODENOSCOPY     FINGER SURGERY     Right index-excision of mass    FOOT SURGERY Right    Bone Spurs   IR FLUORO GUIDE CV LINE RIGHT  01/29/2023   IR REMOVAL TUN CV CATH W/O FL  02/08/2023   IR US GUIDE VASC ACCESS RIGHT  01/31/2023   LAPAROSCOPIC HYSTERECTOMY  09/03/2012   Procedure: HYSTERECTOMY TOTAL LAPAROSCOPIC;  Surgeon: Ok Edwards, MD;  Location: [facility];  Service: Gynecology;  Laterality: N/A;   LUMBAR DISC SURGERY     TUBAL LIGATION     UMBILICAL HERNIA REPAIR N/A 05/01/2018   Procedure: UMBILICAL HERNIA REPAIR;  Surgeon: Jimmye Norman, MD;  Location: [facility] OR;  Service:  General;  Laterality: N/A;   Patient Active Problem List   Diagnosis Date Noted   CIDP (chronic inflammatory demyelinating polyneuropathy) (HCC) 01/28/2023   Gait abnormality 01/28/2023   Glaucoma 01/28/2023   Pain due to onychomycosis of toenails of both feet 01/21/2023   Wheelchair dependence 12/30/2022   Nerve pain 12/30/2022   Insomnia due to medical condition 12/30/2022   Acute inflammatory demyelinating polyneuropathy (HCC) 10/01/2022   UTI (urinary tract infection) 09/24/2022   Hypomagnesemia 09/24/2022   Weakness 09/23/2022   Hypokalemia 09/23/2022   B12 deficiency 09/05/2022   Folate deficiency 09/05/2022   Carpal tunnel syndrome, left upper limb 05/11/2019    Class: Chronic   Spinal stenosis of lumbar region with neurogenic claudication  08/20/2018   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   Primary osteoarthritis of right knee 02/27/2017   Right tennis elbow 11/11/2016   Muscle cramps 01/15/2016   Bilateral leg edema 01/08/2016   Radiculopathy 12/20/2015   Hyperglycemia 12/21/2014   Mastodynia, female 11/30/2014   S/P excision of fibroadenoma of breast 11/30/2014   Angioedema of lips 04/07/2014   Fibroadenoma of right breast 03/07/2014   Pelvic pain in female 06/19/2012   Menorrhagia 06/11/2012   SUI (stress urinary incontinence, female) 06/11/2012   HTN (hypertension) 06/11/2012   IBS (irritable bowel syndrome) - diarrhea predominant 03/17/2012   MICROSCOPIC HEMATURIA 11/30/2010   BREAST PAIN, RIGHT 11/30/2010   BREAST MASS, RIGHT 01/12/2010   HYPERCHOLESTEROLEMIA 12/07/2007   CIGARETTE SMOKER 12/07/2007   DEGENERATIVE JOINT DISEASE 12/07/2007   Low back pain with sciatica 12/07/2007   HEADACHE 12/07/2007   Obesity, Class III, BMI 40-49.9 (morbid obesity) (HCC) 08/03/2007   Anxiety state 08/03/2007    ONSET DATE: 12/05/22- referral date  REFERRING DIAG: G61.0 (ICD-10-CM) - Guillain Barr syndrome (HCC)  THERAPY DIAG:    Rationale for Evaluation and Treatment: Rehabilitation  SUBJECTIVE:   SUBJECTIVE STATEMENT: Pt reports she had a rough morning, her transportation did not have her on the schedule PERTINENT HISTORY: Pt is a 57 y/o female hospitalized 09/23/22  with progressive weakness and sensation changes, consistent with Guillain-Barr syndrome. MRI negative. LP results and assessment most consistent with Guillain-Barr syndrome.  Pt was d/c home from CIR 11/06/22 PMH includes: glaucoma anxiety, DJD, fibromyalgia, HTN, tobacco use, B-12 deficiency, OA, ACDF 2017.  Pt was re-hospitalized 01/28/2456 with CIDP (chronic inflammatory demyelinating polyneuropathy). Saw Neuro on day of admission, NCS showed demyelination, referred to hospital for PLEX. Initially diagnosed with an acute demyelinating neuropathy  Jan 2024, treated with IVIG. PMH: Spinal stenosis, HTN, Obesity   PRECAUTIONS: Fall  WEIGHT BEARING RESTRICTIONS: no  PAIN:  Are you having pain? Yes:  NPRS scale: 3/10 Pain location: L knee Pain description: aching Aggravating factors: standing Relieving factors: rest    FALLS: Has patient fallen in last 6 months? No  LIVING ENVIRONMENT: Lives with: lives with their spouse Lives in: House/apartment Stairs: has ramp built Has following equipment at home:   BSC, steadi,  tub bench but can't access bathroom   PLOF: Independent  PATIENT GOALS: increase independence   OBJECTIVE:   HAND DOMINANCE: Right  ADLs: from initial eval Overall ADLs: assistance required, unable to access BR Transfers/ambulation related to ADLs: Eating: unable to cut food Grooming: set up UB Dressing: set up for bra and shirt LB Dressing: max A Toileting: uses BSC uses steadi Bathing: min-mod A in hospital  bed  Tub Shower transfers: n/a Equipment: Emergency planning/management officer  IADLs:dependent for IADLS   MOBILITY STATUS:  uses w/c  ,  per PT report minguard for transfer from w/c to mat    ACTIVITY TOLERANCE: Activity tolerance: Pt fatigues quickly    UPPER EXTREMITY ROM:  see updates for 02/18/23 re-eval   Active ROM Right 02/18/23 Left 02/18/23  Shoulder flexion 130 120  Shoulder abduction 90 90  Shoulder adduction    Shoulder extension    Shoulder internal rotation    Shoulder external rotation    Elbow flexion    Elbow extension  -5  Wrist flexion    Wrist extension    Wrist ulnar deviation    Wrist radial deviation    Wrist pronation    Wrist supination    (Blank rows = not tested)  UPPER EXTREMITY MMT:   see below for re-eval 02/18/23  MMT Right 02/18/23 Left 02/18/23  Shoulder flexion 3+/5 3+/5  Shoulder abduction    Shoulder adduction    Shoulder extension    Shoulder internal rotation    Shoulder external rotation    Middle trapezius    Lower trapezius    Elbow flexion 4/5  4/5  Elbow extension 4-/5 4-/5  Wrist flexion    Wrist extension    Wrist ulnar deviation    Wrist radial deviation    Wrist pronation    Wrist supination      HAND FUNCTION: Grip strength: Right: 32 lbs; Left: 18 lbs, 02/18/23 R 55 lbs, L 35 lbs  COORDINATION: 9 Hole Peg test:initial eval  Right: 1 min 48 sec; Left: 4 pegs in 2 mins  Box and Blocks:  Right 35 blocks, Left  26 blocks 02/18/23- RUE 35.46, LUE 65 secs  SENSATION: Light touch: WFL Hot/Cold: Not tested    COGNITION: Overall cognitive status:NT   VISION: Subjective report: reports hx of glaucoma VISION ASSESSMENT: Not tested  OBSERVATIONS: Pt is eager to improve, pt's husband is very supportive   TODAY'S TREATMENT:                                                                                                                              DATE:02/25/23-Arm bike x 6 mins level 2 for conditioning, Pt transferred to the seat for the first time with supervision Reviewed previously issued red theraband exercises for UE strength 20 reps each, min v.c  Fine motor coordination exercises with left and right UE's, flipping playing cards and stacking coins with min difficulty using RUE, picking up coins and placing in container with LUE and flipping cards mod difficulty. Pt attempted placing small pegs into a pegboard with bilateral UE's max difficulty with LUE, mod difficulty with RUE, task was discontinued. Pt reports being a little off. She took her meds without eating and got into a disagreement with transportation this a.m.    02/18/23- re-eval performed and therapist discussed potential goals with patient.  PATIENT EDUCATION: Education details:  reviewed red theraband  HEP. Person educated: Patient Education method: explanation, demonstration Education comprehension: verbalized understanding, returned demonstration.  HOME EXERCISE  PROGRAM:  12/17/22- coordination HEP, cane exercises seated   GOALS: Goals reviewed  with patient? Yes  SHORT TERM GOALS: Target date: 01/11/23  I with initial HEP Baseline: Goal status: met 01/21/23  2.  Pt will demonstrate improved UE functional use as evidenced increasing bilateral box/ blocks score by 4 blocks.  Baseline: R 35, L 26 Goal status  met 12/31/22 RUE :43:  LUE 35    3.  Pt will donn pants with mod A. Baseline: max A Goal status:met  02/18/23- pt performed mod I today  4. Pt will verbalize understanding of adapted strategies to maximize safety and I with ADLs/ IADLs .  Goal status:  ongoing, 02/18/23  5.  Pt will demonstrate ability to stand for functional activity with min A x 10 mins in prep for ADLs.  Goal status: ongoing, pt stood for 1 min today, 02/18/23  6.  Pt will perform w/c to [facility] transfers mod I  Goal status:  met, 02/18/23 7. I with updated HEP  Goal status- new  8.Pt will improve LUE grip strength by 8 lbs for increased ease with ADLs.  Goal status-new 9. Pt will perform tub transfer and shower with min A  Goal status: new   LONG TERM GOALS: Target date: 03/06/23  I with updated HEP  Goal status: ongoing, 02/18/23  2.  Pt will perform LB dressing mod I. Baseline:max A Goal status: ongoing min A with LB dressing 02/18/23  3.  Pt will bathe with supervision/ set up. Baseline: min-mod A for sponge bath Goal status: met for sponge bathing at set up   4.  Pt will perform basic cooking at a w/c level mod I  Revised goal: Pt will perform basic cooking modified independently at a walker level.  Goal status:  met, 02/18/23  pt has performed simple tasks mod I w/c level, goal revised   5.  Pt will demonstrate improved fine motor coordination as evidenced by performing LUE 9 hole peg test in 2 mins or less.  Revised goal:Pt will demonstrate improved fine motor coordination as evidenced by performing 9 hole peg test in 55 secs or less  Goal status: initial goal met 6/4/246/4/24-  LUE 65 secs , goal revised  6.  Pt will demonstrate improved  fine motor coordination as evidenced by performing RUE 9 hole peg test in 1 mins  30 secs or less.  Baseline: RUE  1 min 48 secs, LUE 4 pegs placed in 2 mins  Goal status: met 6/4/246/4/24- RUE 35.46,     7. Pt will perform shower with supervision using tub transfer bench    Goal status: new   8. Pt will perform basic home management at a walker level mod I    Goal status: new ASSESSMENT:  CLINICAL IMPRESSION: Pt is progressing towards goals. She demonstrates improving strength and mobility. Pt reports her coordination was a little off today. PERFORMANCE DEFICITS: in functional skills including ADLs, IADLs, coordination, dexterity, sensation, ROM, strength, flexibility, Fine motor control, Gross motor control, mobility, balance, endurance, decreased knowledge of precautions, decreased knowledge of use of DME, and UE functional use, cognitive skills including  and psychosocial skills including coping strategies, environmental adaptation, habits, interpersonal interactions, and routines and behaviors.   IMPAIRMENTS: are limiting patient from ADLs, IADLs, rest and sleep, play, leisure, and social participation.   CO-MORBIDITIES: may have co-morbidities  that affects occupational performance. Patient will benefit from skilled OT to address above impairments and improve overall function.  MODIFICATION OR  ASSISTANCE TO COMPLETE EVALUATION: No modification of tasks or assist necessary to complete an evaluation.  OT OCCUPATIONAL PROFILE AND HISTORY: Detailed assessment: Review of records and additional review of physical, cognitive, psychosocial history related to current functional performance.  CLINICAL DECISION MAKING: LOW - limited treatment options, no task modification necessary  REHAB POTENTIAL: Good  EVALUATION COMPLEXITY: Low    PLAN:  OT FREQUENCY: 2x/week  OT DURATION: 12 weeks  PLANNED INTERVENTIONS: self care/ADL training, therapeutic exercise, therapeutic activity,  neuromuscular re-education, manual therapy, passive range of motion, gait training, balance training, stair training, functional mobility training, aquatic therapy, ultrasound, paraffin, moist heat, cryotherapy, patient/family education, energy conservation, coping strategies training, DME and/or AE instructions, and Re-evaluation  RECOMMENDED OTHER SERVICES: PT  CONSULTED AND AGREED WITH PLAN OF CARE: Patient and family member/caregiver  PLAN FOR NEXT SESSION:  work towards updated goals  Jill Ruppe, OT 02/25/2023, 10:07 AM

## 2023-02-25 NOTE — Therapy (Signed)
OUTPATIENT PHYSICAL THERAPY LOWER EXTREMITY TREATMENT   Patient Name: Dana [name] MRN: [id number] DOB:27-Feb-1966, 57 y.o., female Today's Date: 02/25/2023  END OF SESSION:  PT End of Session - 02/25/23 0850     Visit Number 10    Date for PT Re-Evaluation 04/15/23    PT Start Time 0845    PT Stop Time 0925    PT Time Calculation (min) 40 min    Equipment Utilized During Treatment Gait belt    Activity Tolerance Patient tolerated treatment well    Behavior During Therapy [facility] for tasks assessed/performed              Past Medical History:  Diagnosis Date   Anxiety    Back pain with radiation    Breast mass, right    Chronic pain    Chronic, continuous use of opioids    Complication of anesthesia 04/07/14   Allergic reaction to Lisinopril immediately following surgery   DJD (degenerative joint disease)    Fibromyalgia    Groin abscess    Headache(784.0)    History of IBS    Hypercholesterolemia    IBS (irritable bowel syndrome)    Lactose intolerance    Mild hypertension    Obesity    Tobacco use disorder    Umbilical hernia    Symptomatic   Past Surgical History:  Procedure Laterality Date   ABDOMINAL HYSTERECTOMY     ANTERIOR CERVICAL DECOMP/DISCECTOMY FUSION N/A 12/20/2015   Procedure: ANTERIOR CERVICAL DECOMPRESSION FUSION CERVICAL 4-5, CERVICAL 5-6, CERVICAL 6-7 WITH INSTRUMENTATION AND ALLOGRAFT;  Surgeon: Estill Bamberg, MD;  Location: [facility] OR;  Service: Orthopedics;  Laterality: N/A;  Anterior cervical decompression fusion, cervical 4-5, cervical 5-6, cervical 6-7 with instrumentation and allograft   BACK SURGERY     BILATERAL SALPINGECTOMY  09/03/2012   Procedure: BILATERAL SALPINGECTOMY;  Surgeon: Ok Edwards, MD;  Location: [facility];  Service: Gynecology;  Laterality: Bilateral;   BREAST BIOPSY Right 04/07/2014   Procedure: REMOVAL RIGHT BREAST MASS WITH WIRE LOCALIZATION;  Surgeon: Adolph Pollack, MD;  Location: [facility] OR;  Service: General;   Laterality: Right;   BREAST EXCISIONAL BIOPSY Left    x2   BREAST LUMPECTOMY     x2   CARPAL TUNNEL RELEASE Left 05/11/2019   Procedure: LEFT CARPAL TUNNEL RELEASE, RIGHT TENNIS ELBOW MARCAINE/DEPO MEDROL INJECTION UNDER ANESTHESIA;  Surgeon: Kerrin Champagne, MD;  Location: [facility] OR;  Service: Orthopedics;  Laterality: Left;   COLONOSCOPY W/ BIOPSIES  04/24/2012   per Dr. Leone Payor, clear, repeat in 10 yrs    disectomy     ESOPHAGOGASTRODUODENOSCOPY     FINGER SURGERY     Right index-excision of mass    FOOT SURGERY Right    Bone Spurs   IR FLUORO GUIDE CV LINE RIGHT  01/29/2023   IR REMOVAL TUN CV CATH W/O FL  02/08/2023   IR US GUIDE VASC ACCESS RIGHT  01/31/2023   LAPAROSCOPIC HYSTERECTOMY  09/03/2012   Procedure: HYSTERECTOMY TOTAL LAPAROSCOPIC;  Surgeon: Ok Edwards, MD;  Location: [facility];  Service: Gynecology;  Laterality: N/A;   LUMBAR DISC SURGERY     TUBAL LIGATION     UMBILICAL HERNIA REPAIR N/A 05/01/2018   Procedure: UMBILICAL HERNIA REPAIR;  Surgeon: Jimmye Norman, MD;  Location: [facility] OR;  Service: General;  Laterality: N/A;   Patient Active Problem List   Diagnosis Date Noted   CIDP (chronic inflammatory demyelinating polyneuropathy) (HCC) 01/28/2023   Gait abnormality 01/28/2023  Glaucoma 01/28/2023   Pain due to onychomycosis of toenails of both feet 01/21/2023   Wheelchair dependence 12/30/2022   Nerve pain 12/30/2022   Insomnia due to medical condition 12/30/2022   Acute inflammatory demyelinating polyneuropathy (HCC) 10/01/2022   UTI (urinary tract infection) 09/24/2022   Hypomagnesemia 09/24/2022   Weakness 09/23/2022   Hypokalemia 09/23/2022   B12 deficiency 09/05/2022   Folate deficiency 09/05/2022   Carpal tunnel syndrome, left upper limb 05/11/2019    Class: Chronic   Spinal stenosis of lumbar region with neurogenic claudication 08/20/2018   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   Primary osteoarthritis of right knee 02/27/2017   Right tennis elbow  11/11/2016   Muscle cramps 01/15/2016   Bilateral leg edema 01/08/2016   Radiculopathy 12/20/2015   Hyperglycemia 12/21/2014   Mastodynia, female 11/30/2014   S/P excision of fibroadenoma of breast 11/30/2014   Angioedema of lips 04/07/2014   Fibroadenoma of right breast 03/07/2014   Pelvic pain in female 06/19/2012   Menorrhagia 06/11/2012   SUI (stress urinary incontinence, female) 06/11/2012   HTN (hypertension) 06/11/2012   IBS (irritable bowel syndrome) - diarrhea predominant 03/17/2012   MICROSCOPIC HEMATURIA 11/30/2010   BREAST PAIN, RIGHT 11/30/2010   BREAST MASS, RIGHT 01/12/2010   HYPERCHOLESTEROLEMIA 12/07/2007   CIGARETTE SMOKER 12/07/2007   DEGENERATIVE JOINT DISEASE 12/07/2007   Low back pain with sciatica 12/07/2007   HEADACHE 12/07/2007   Obesity, Class III, BMI 40-49.9 (morbid obesity) (HCC) 08/03/2007   Anxiety state 08/03/2007    PCP: Nelwyn Salisbury, MD  REFERRING PROVIDER: Nelwyn Salisbury, MD  REFERRING DIAG: G61.0 (ICD-10-CM) - Guillain Barr syndrome [facility])   THERAPY DIAG:  Other lack of coordination  Muscle weakness (generalized)  Unsteadiness on feet  Difficulty in walking, not elsewhere classified  Guillain Barr syndrome (HCC)  CIDP (chronic inflammatory demyelinating polyneuropathy) (HCC)  Rationale for Evaluation and Treatment: Rehabilitation  ONSET DATE: 12/05/22  SUBJECTIVE:   SUBJECTIVE STATEMENT: Patient reports she is making it. She is tired, but is stronger. Diagnosed with CIDP. Excite to practice car transfers today.  OBJECTIVE STATEMENT: 01/28/23 ASSESSMENT AND PLAN   Dana [name] is a 57 y.o. female   Demyelinating polyradiculoneuropathy             Symptom onset monophasic since September 2024, failed to respond to IVIG in January 2024,             EMG nerve conduction study showed demyelinating features, no evidence of axonal loss,             Talk with neurohospitalist Dr. Iver Nestle, ED triage, will send her for  hospital admission for plasma exchange, please add on following labs, immunofixative protein electrophoresis, ANA with reflex, SSA, SSB titer, iron panel             Starting outpatient IVIG prior authorization process,  Dana [name] is a 57 y.o. female who [facility] to the [facility] ED on 09/23/2022 with bilateral lower extremity weakness with decreased mobility that has progressed to both arms. She also reported numbness and tingling of the extremities. She was transferred to [facility] for MRI. 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  Inpatient Rehab F/B HHPT.  R knee injection 10/08/22 trigger point injections for worsening back pain 2/15  RA  PAIN:  Are you having pain? Yes: NPRS scale: up to 10/10 Pain location: all over, but her R knee pain is worst,  Pain description: Sharp Aggravating factors: Has difficulty standing up Relieving  factors: Medicine helps somewhat  PRECAUTIONS: Fall  WEIGHT BEARING RESTRICTIONS: No  FALLS:  Has patient fallen in last 6 months? No  LIVING ENVIRONMENT: Lives with: lives with their family Lives in: House/apartment Stairs: Nohas a ramp Has following equipment at home: Environmental consultant - 2 wheeled, Wheelchair (power), Tour manager, bed side commode, Ramped entry, and [employer] lift  OCCUPATION: on disability due to RA and a fall several years ago  PLOF: Independent  PATIENT GOALS: walk  NEXT MD VISIT: She is scheduled to follow up with Dr. Serita Sheller with Physical Med/Rehab on 12-30-22. She is scheduled to follow up with Dr. Levert Feinstein at Neurology on 01-28-23.   OBJECTIVE:   DIAGNOSTIC FINDINGS:  Right knee radiographs 11/09/2020   FINDINGS: Severe patellofemoral joint space narrowing. Severe superior and mild inferior patellar degenerative osteophytosis. Moderate superior trochlear degenerative osteophytosis. No joint effusion. Severe medial compartment joint space narrowing with moderate peripheral medial and lateral compartment  degenerative osteophytes.  COGNITION: Overall cognitive status: Within functional limits for tasks assessed     SENSATION: Light touch: Impaired  and B hands and feet and lower legs  EDEMA:  BLE edema noted.  MUSCLE LENGTH: Hamstrings: WFL Thomas test: B tightness, to neutral  POSTURE: rounded shoulders and flexed trunk   PALPATION: No TTP  LOWER EXTREMITY ROM: BLE ROM limited in all planes an all joints due to body habitus   LOWER EXTREMITY MMT:  MMT Right eval Left eval  Hip flexion 3 3-  Hip extension 3- 3-  Hip abduction 3 3-  Hip adduction    Hip internal rotation    Hip external rotation    Knee flexion 4 4-  Knee extension 4- 4-  Ankle dorsiflexion 4- 4-  Ankle plantarflexion    Ankle inversion 4- 4-  Ankle eversion 4- 4-   (Blank rows = not tested)  FUNCTIONAL TESTS:  5 times sit to stand: unable to complete Timed up and go (TUG): unable to complete  Bed Mobility: Occasional min A for sup <> sit, rolled B on mat with great effort, light min A  TRANSFERS: Scooting transfer with CGA, stand pivot with CGA, she reports that she sometimes has difficulty rising due to her knee pain  GAIT: Distance walked: 0' Assistive device utilized:  shopping cart-will bring her RW from home. Level of assistance: Min A Comments: Stood and weight shifted, then stood again and took steps to turn to W/C.  TODAY'S TREATMENT:                                                                                                                              DATE:  02/25/23 Ambulation with RW, 2 x 12', CGA, slides feet, but stable Standing activities with RW-Heel raises, alternating arm raises, reaching into rotation with UUE support, then step back and rotate to reach W/C armrest, 5-10 reps each with rest breaks. Practiced stand pivot car transfers, demonstrating tech to husband, who declined  to practice. She completed with RW and CGA.  02/18/23 Patient's functional status  re-assessed after hospitalization-See goals for current status. NuStep L4 x 6 minutes  01/23/23 NuStep L5 x 6 minutes Transfers <> mat and NuStep with CGA for safety, no physical assistance required Supine with physioball- Rolling DKTC with gentle push for stretch and stability, Bridge, Bridge with B hip IR, quick LTR to activate abdominals. 10 reps each Heel slides x5 LLE Bridges x10  Standing with RW - 10s stands without any support x2  Small marches with RW 2x5  Transfer mat <>WC stand pivot with minA 2 people help Seated cable rows and ext 10# 2x10    01/21/23 B (15) and then single limb (2 x 10) pushes on Fitter Seated shoulder ext, rows, trunk rotation, all with G tband resistance, 10 reps each. Parallel bars, standing weight shifts, Heel raises x 10, sliding B hands on rail Single steps forward and back with alternating legs, RLE had partial buckle, required min A for stability, with a lot of pain for patient.  01/16/23 NuStep L5 x 6 minutes then L7 legs only x 2 minutes. Seated shoulder ext and rows with 10#, 2 x 10 reps each Seated chest press against 5# x 10 reps Scooting transfers to each side with S. Seated rotation to R, slide hands out, lift L leg onto the mat, then return. 3 x each direction. Ambulation x 8 steps with RW and min A. She had more difficulty stepping with LLE due to R knee pain today.  PATIENT EDUCATION:  Education details: POC Person educated: Patient Education method: Explanation Education comprehension: verbalized understanding  HOME EXERCISE PROGRAM: TBD  ASSESSMENT:  CLINICAL IMPRESSION: Patient reports no big changes. She did walk into bathroom with RW by herself. Continued with strengthening, balance training, and then practiced car transfer with husband present. Able to complete with CGA.  OBJECTIVE IMPAIRMENTS: Abnormal gait, decreased activity tolerance, decreased balance, decreased coordination, decreased endurance, difficulty walking,  decreased ROM, decreased strength, and postural dysfunction.   ACTIVITY LIMITATIONS: carrying, lifting, bending, standing, squatting, stairs, transfers, and locomotion level  PARTICIPATION LIMITATIONS: meal prep, cleaning, laundry, driving, and shopping  PERSONAL FACTORS: Fitness, Past/current experiences, and 1 comorbidity: Recently diagnosed with GBS  are also affecting patient's functional outcome.   REHAB POTENTIAL: Good  CLINICAL DECISION MAKING: Evolving/moderate complexity  EVALUATION COMPLEXITY: Moderate   GOALS: Goals reviewed with patient? Yes  SHORT TERM GOALS: Target date: 11/28/22 I with initial HEP Baseline: Goal status: met 01/09/23  LONG TERM GOALS: Target date: 03/06/23  I with final HEP Baseline:  Goal status: INITIAL  2.  Decrease 5x STS to < 14 sec Baseline: unable to complete Goal status: 02/18/23-39 sec-ongoing  3.  Decrease TUG to < 20 sec Baseline: Unable to complete. Goal status: 02/18/23 1:55 Ongoing  4.  Patient will ambulate at least 300' with LRAD, MI, on level and unlevel surfaces. Baseline: Stand pivot transfer with CGA Goal status: 02/18/23-20', RW, CGA, slow, but stable throughout. Ongoing  5.  Perform all bed mobility with I Baseline: Occ min A on mat Goal status: 02/18/23-met  6.  Increase BLE strength to at least 4/5 throughout. Baseline: (3-)-3/5 Goal status: 02/18/23-RLE 4-/5, LLE 3+/5, Ongoing  7. Patient and husband will safely perform car transfers with LRAD Baseline: Currently uses van transportation Goal Status: 02/25/23 met  PLAN:  PT FREQUENCY: 1-2x/week  PT DURATION: 12 weeks  PLANNED INTERVENTIONS: Therapeutic exercises, Therapeutic activity, Neuromuscular re-education, Balance training, Gait training, Patient/Family education,  Self Care, Joint mobilization, Stair training, Dry Needling, Electrical stimulation, Cryotherapy, Moist heat, Ultrasound, Ionotophoresis 4mg /ml Dexamethasone, and Manual therapy  PLAN FOR NEXT  SESSION: Initiate HEP  Oley Balm DPT 02/25/23 9:54 AM

## 2023-02-26 NOTE — Therapy (Signed)
OUTPATIENT OCCUPATIONAL THERAPY NEURO Treatment  Patient Name: Dana [name] MRN: [id number] DOB:[birth year], 57 y.o., female Today's Date: 02/27/2023  PCP: Dr. Clent Ridges REFERRING PROVIDER: Dr. Gershon Crane  END OF SESSION:  OT End of Session - 02/27/23 0939     Visit Number 12    Number of Visits 35    Date for OT Re-Evaluation 05/13/23    Authorization Type UHC, Medicare    Authorization - Visit Number 12    Progress Note Due on Visit 20    OT Start Time [phone]    OT Stop Time 1015    OT Time Calculation (min) 42 min                 Past Medical History:  Diagnosis Date   Anxiety    Back pain with radiation    Breast mass, right    Chronic pain    Chronic, continuous use of opioids    Complication of anesthesia 04/07/14   Allergic reaction to Lisinopril immediately following surgery   DJD (degenerative joint disease)    Fibromyalgia    Groin abscess    Headache(784.0)    History of IBS    Hypercholesterolemia    IBS (irritable bowel syndrome)    Lactose intolerance    Mild hypertension    Obesity    Tobacco use disorder    Umbilical hernia    Symptomatic   Past Surgical History:  Procedure Laterality Date   ABDOMINAL HYSTERECTOMY     ANTERIOR CERVICAL DECOMP/DISCECTOMY FUSION N/A 12/20/2015   Procedure: ANTERIOR CERVICAL DECOMPRESSION FUSION CERVICAL 4-5, CERVICAL 5-6, CERVICAL 6-7 WITH INSTRUMENTATION AND ALLOGRAFT;  Surgeon: Estill Bamberg, MD;  Location: [facility] OR;  Service: Orthopedics;  Laterality: N/A;  Anterior cervical decompression fusion, cervical 4-5, cervical 5-6, cervical 6-7 with instrumentation and allograft   BACK SURGERY     BILATERAL SALPINGECTOMY  09/03/2012   Procedure: BILATERAL SALPINGECTOMY;  Surgeon: Ok Edwards, MD;  Location: [facility];  Service: Gynecology;  Laterality: Bilateral;   BREAST BIOPSY Right 04/07/2014   Procedure: REMOVAL RIGHT BREAST MASS WITH WIRE LOCALIZATION;  Surgeon: Adolph Pollack, MD;  Location: [facility] OR;  Service:  General;  Laterality: Right;   BREAST EXCISIONAL BIOPSY Left    x2   BREAST LUMPECTOMY     x2   CARPAL TUNNEL RELEASE Left 05/11/2019   Procedure: LEFT CARPAL TUNNEL RELEASE, RIGHT TENNIS ELBOW MARCAINE/DEPO MEDROL INJECTION UNDER ANESTHESIA;  Surgeon: Kerrin Champagne, MD;  Location: [facility] OR;  Service: Orthopedics;  Laterality: Left;   COLONOSCOPY W/ BIOPSIES  04/24/2012   per Dr. Leone Payor, clear, repeat in 10 yrs    disectomy     ESOPHAGOGASTRODUODENOSCOPY     FINGER SURGERY     Right index-excision of mass    FOOT SURGERY Right    Bone Spurs   IR FLUORO GUIDE CV LINE RIGHT  01/29/2023   IR REMOVAL TUN CV CATH W/O FL  02/08/2023   IR US GUIDE VASC ACCESS RIGHT  01/31/2023   LAPAROSCOPIC HYSTERECTOMY  09/03/2012   Procedure: HYSTERECTOMY TOTAL LAPAROSCOPIC;  Surgeon: Ok Edwards, MD;  Location: [facility];  Service: Gynecology;  Laterality: N/A;   LUMBAR DISC SURGERY     TUBAL LIGATION     UMBILICAL HERNIA REPAIR N/A 05/01/2018   Procedure: UMBILICAL HERNIA REPAIR;  Surgeon: Jimmye Norman, MD;  Location: [facility] OR;  Service: General;  Laterality: N/A;   Patient Active Problem List   Diagnosis Date Noted   CIDP (  chronic inflammatory demyelinating polyneuropathy) (HCC) 01/28/2023   Gait abnormality 01/28/2023   Glaucoma 01/28/2023   Pain due to onychomycosis of toenails of both feet 01/21/2023   Wheelchair dependence 12/30/2022   Nerve pain 12/30/2022   Insomnia due to medical condition 12/30/2022   Acute inflammatory demyelinating polyneuropathy (HCC) 10/01/2022   UTI (urinary tract infection) 09/24/2022   Hypomagnesemia 09/24/2022   Weakness 09/23/2022   Hypokalemia 09/23/2022   B12 deficiency 09/05/2022   Folate deficiency 09/05/2022   Carpal tunnel syndrome, left upper limb 05/11/2019    Class: Chronic   Spinal stenosis of lumbar region with neurogenic claudication 08/20/2018   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   Primary osteoarthritis of right knee 02/27/2017   Right  tennis elbow 11/11/2016   Muscle cramps 01/15/2016   Bilateral leg edema 01/08/2016   Radiculopathy 12/20/2015   Hyperglycemia 12/21/2014   Mastodynia, female 11/30/2014   S/P excision of fibroadenoma of breast 11/30/2014   Angioedema of lips 04/07/2014   Fibroadenoma of right breast 03/07/2014   Pelvic pain in female 06/19/2012   Menorrhagia 06/11/2012   SUI (stress urinary incontinence, female) 06/11/2012   HTN (hypertension) 06/11/2012   IBS (irritable bowel syndrome) - diarrhea predominant 03/17/2012   MICROSCOPIC HEMATURIA 11/30/2010   BREAST PAIN, RIGHT 11/30/2010   BREAST MASS, RIGHT 01/12/2010   HYPERCHOLESTEROLEMIA 12/07/2007   CIGARETTE SMOKER 12/07/2007   DEGENERATIVE JOINT DISEASE 12/07/2007   Low back pain with sciatica 12/07/2007   HEADACHE 12/07/2007   Obesity, Class III, BMI 40-49.9 (morbid obesity) (HCC) 08/03/2007   Anxiety state 08/03/2007    ONSET DATE: 12/05/22- referral date  REFERRING DIAG: G61.0 (ICD-10-CM) - Guillain Barr syndrome (HCC)  THERAPY DIAG:    Rationale for Evaluation and Treatment: Rehabilitation  SUBJECTIVE:   SUBJECTIVE STATEMENT: Pt reports she feels better today, she took meds last visit without eating PERTINENT HISTORY: Pt is a 57 y/o female hospitalized 09/23/22  with progressive weakness and sensation changes, consistent with Guillain-Barr syndrome. MRI negative. LP results and assessment most consistent with Guillain-Barr syndrome.  Pt was d/c home from CIR 11/06/22 PMH includes: glaucoma anxiety, DJD, fibromyalgia, HTN, tobacco use, B-12 deficiency, OA, ACDF 2017.  Pt was re-hospitalized 01/28/2456 with CIDP (chronic inflammatory demyelinating polyneuropathy). Saw Neuro on day of admission, NCS showed demyelination, referred to hospital for PLEX. Initially diagnosed with an acute demyelinating neuropathy Jan 2024, treated with IVIG. PMH: Spinal stenosis, HTN, Obesity   PRECAUTIONS: Fall  WEIGHT BEARING RESTRICTIONS: no  PAIN:   Are you having pain? Yes:  NPRS scale: 3/10 Pain location: R knee Pain description: aching Aggravating factors: standing Relieving factors: rest    FALLS: Has patient fallen in last 6 months? No  LIVING ENVIRONMENT: Lives with: lives with their spouse Lives in: House/apartment Stairs: has ramp built Has following equipment at home:   BSC, steadi,  tub bench but can't access bathroom   PLOF: Independent  PATIENT GOALS: increase independence   OBJECTIVE:   HAND DOMINANCE: Right  ADLs: from initial eval Overall ADLs: assistance required, unable to access BR Transfers/ambulation related to ADLs: Eating: unable to cut food Grooming: set up UB Dressing: set up for bra and shirt LB Dressing: max A Toileting: uses BSC uses steadi Bathing: min-mod A in hospital  bed  Tub Shower transfers: n/a Equipment: Emergency planning/management officer  IADLs:dependent for IADLS   MOBILITY STATUS:  uses w/c  , per PT report minguard for transfer from w/c to mat    ACTIVITY TOLERANCE: Activity tolerance: Pt fatigues  quickly    UPPER EXTREMITY ROM:  see updates for 02/18/23 re-eval   Active ROM Right 02/18/23 Left 02/18/23  Shoulder flexion 130 120  Shoulder abduction 90 90  Shoulder adduction    Shoulder extension    Shoulder internal rotation    Shoulder external rotation    Elbow flexion    Elbow extension  -5  Wrist flexion    Wrist extension    Wrist ulnar deviation    Wrist radial deviation    Wrist pronation    Wrist supination    (Blank rows = not tested)  UPPER EXTREMITY MMT:   see below for re-eval 02/18/23  MMT Right 02/18/23 Left 02/18/23  Shoulder flexion 3+/5 3+/5  Shoulder abduction    Shoulder adduction    Shoulder extension    Shoulder internal rotation    Shoulder external rotation    Middle trapezius    Lower trapezius    Elbow flexion 4/5 4/5  Elbow extension 4-/5 4-/5  Wrist flexion    Wrist extension    Wrist ulnar deviation    Wrist radial deviation     Wrist pronation    Wrist supination      HAND FUNCTION: Grip strength: Right: 32 lbs; Left: 18 lbs, 02/18/23 R 55 lbs, L 35 lbs  COORDINATION: 9 Hole Peg test:initial eval  Right: 1 min 48 sec; Left: 4 pegs in 2 mins  Box and Blocks:  Right 35 blocks, Left  26 blocks 02/18/23- RUE 35.46, LUE 65 secs  SENSATION: Light touch: WFL Hot/Cold: Not tested    COGNITION: Overall cognitive status:NT   VISION: Subjective report: reports hx of glaucoma VISION ASSESSMENT: Not tested  OBSERVATIONS: Pt is eager to improve, pt's husband is very supportive   TODAY'S TREATMENT:                                                                                                                              DATE: 02/27/23-Arm bike x 6 mins level 2 for conditioning (Pt transfers to and from w/c with supervision) Placing large pegs into pegboard with LUE then removing for increased fine motor coordination, min difficulty/ v.c  Standing at countertop with supervision to place and remove items with right then left UE's, pt stood x 3 for 30 secs to 1 min 15 secs, rest breaks in between Gripper set at level 1 to pick up 1 inch blocks  with right and left UE's for sustained grip, min difficulty Pt reports amb to BR with walker without assist, therapist recommends supervision for safety.  02/25/23-Arm bike x 6 mins level 2 for conditioning, Pt transferred to the seat for the first time with supervision Reviewed previously issued red theraband exercises for UE strength 20 reps each, min v.c  Fine motor coordination exercises with left and right UE's, flipping playing cards and stacking coins with min difficulty using RUE, picking up coins and placing in container with LUE and flipping cards  mod difficulty. Pt attempted placing small pegs into a pegboard with bilateral UE's max difficulty with LUE, mod difficulty with RUE, task was discontinued. Pt reports being a little off. She took her meds without eating and  got into a disagreement with transportation this a.m.    02/18/23- re-eval performed and therapist discussed potential goals with patient.  PATIENT EDUCATION: Education details:  see above Person educated: Patient Education method: explanation, demonstration Education comprehension: verbalized understanding, returned demonstration.  HOME EXERCISE PROGRAM:  12/17/22- coordination HEP, cane exercises seated   GOALS: Goals reviewed with patient? Yes  SHORT TERM GOALS: Target date: 01/11/23  I with initial HEP Baseline: Goal status: met 01/21/23  2.  Pt will demonstrate improved UE functional use as evidenced increasing bilateral box/ blocks score by 4 blocks.  Baseline: R 35, L 26 Goal status  met 12/31/22 RUE :43:  LUE 35    3.  Pt will donn pants with mod A. Baseline: max A Goal status:met  02/18/23- pt performed mod I today  4. Pt will verbalize understanding of adapted strategies to maximize safety and I with ADLs/ IADLs .  Goal status:  ongoing, 02/18/23  5.  Pt will demonstrate ability to stand for functional activity with min A x 10 mins in prep for ADLs.  Goal status: ongoing, pt stood for 1 min today, 02/18/23  6.  Pt will perform w/c to [facility] transfers mod I  Goal status:  met, 02/18/23 7. I with updated HEP  Goal status- new  8.Pt will improve LUE grip strength by 8 lbs for increased ease with ADLs.  Goal status-new 9. Pt will perform tub transfer and shower with min A  Goal status: new   LONG TERM GOALS: Target date: 03/06/23  I with updated HEP  Goal status: ongoing, 02/18/23  2.  Pt will perform LB dressing mod I. Baseline:max A Goal status: ongoing min A with LB dressing 02/18/23  3.  Pt will bathe with supervision/ set up. Baseline: min-mod A for sponge bath Goal status: met for sponge bathing at set up   4.  Pt will perform basic cooking at a w/c level mod I  Revised goal: Pt will perform basic cooking modified independently at a walker level.  Goal  status:  met, 02/18/23  pt has performed simple tasks mod I w/c level, goal revised   5.  Pt will demonstrate improved fine motor coordination as evidenced by performing LUE 9 hole peg test in 2 mins or less.  Revised goal:Pt will demonstrate improved fine motor coordination as evidenced by performing 9 hole peg test in 55 secs or less  Goal status: initial goal met 6/4/246/4/24-  LUE 65 secs , goal revised  6.  Pt will demonstrate improved fine motor coordination as evidenced by performing RUE 9 hole peg test in 1 mins  30 secs or less.  Baseline: RUE  1 min 48 secs, LUE 4 pegs placed in 2 mins  Goal status: met 6/4/246/4/24- RUE 35.46,     7. Pt will perform shower with supervision using tub transfer bench    Goal status: new   8. Pt will perform basic home management at a walker level mod I    Goal status: new ASSESSMENT:  CLINICAL IMPRESSION: Pt is progressing towards goals. She demonstrates improving transfers, standing balance, and coordination.   PERFORMANCE DEFICITS: in functional skills including ADLs, IADLs, coordination, dexterity, sensation, ROM, strength, flexibility, Fine motor control, Gross motor control, mobility, balance, endurance, decreased  knowledge of precautions, decreased knowledge of use of DME, and UE functional use, cognitive skills including  and psychosocial skills including coping strategies, environmental adaptation, habits, interpersonal interactions, and routines and behaviors.   IMPAIRMENTS: are limiting patient from ADLs, IADLs, rest and sleep, play, leisure, and social participation.   CO-MORBIDITIES: may have co-morbidities  that affects occupational performance. Patient will benefit from skilled OT to address above impairments and improve overall function.  MODIFICATION OR ASSISTANCE TO COMPLETE EVALUATION: No modification of tasks or assist necessary to complete an evaluation.  OT OCCUPATIONAL PROFILE AND HISTORY: Detailed assessment: Review of  records and additional review of physical, cognitive, psychosocial history related to current functional performance.  CLINICAL DECISION MAKING: LOW - limited treatment options, no task modification necessary  REHAB POTENTIAL: Good  EVALUATION COMPLEXITY: Low    PLAN:  OT FREQUENCY: 2x/week  OT DURATION: 12 weeks  PLANNED INTERVENTIONS: self care/ADL training, therapeutic exercise, therapeutic activity, neuromuscular re-education, manual therapy, passive range of motion, gait training, balance training, stair training, functional mobility training, aquatic therapy, ultrasound, paraffin, moist heat, cryotherapy, patient/family education, energy conservation, coping strategies training, DME and/or AE instructions, and Re-evaluation  RECOMMENDED OTHER SERVICES: PT  CONSULTED AND AGREED WITH PLAN OF CARE: Patient and family member/caregiver  PLAN FOR NEXT SESSION:   continue to address UE strength and coordination  Olivine Hiers, OT 02/27/2023, 9:39 AM

## 2023-02-27 ENCOUNTER — Encounter: Payer: Self-pay | Admitting: Occupational Therapy

## 2023-02-27 ENCOUNTER — Encounter: Payer: Self-pay | Admitting: Physical Therapy

## 2023-02-27 ENCOUNTER — Ambulatory Visit: Payer: 59 | Admitting: Occupational Therapy

## 2023-02-27 ENCOUNTER — Ambulatory Visit: Payer: 59 | Admitting: Physical Therapy

## 2023-02-27 DIAGNOSIS — M6281 Muscle weakness (generalized): Secondary | ICD-10-CM

## 2023-02-27 DIAGNOSIS — R262 Difficulty in walking, not elsewhere classified: Secondary | ICD-10-CM

## 2023-02-27 DIAGNOSIS — G6181 Chronic inflammatory demyelinating polyneuritis: Secondary | ICD-10-CM

## 2023-02-27 DIAGNOSIS — R278 Other lack of coordination: Secondary | ICD-10-CM

## 2023-02-27 DIAGNOSIS — R2689 Other abnormalities of gait and mobility: Secondary | ICD-10-CM

## 2023-02-27 DIAGNOSIS — R2681 Unsteadiness on feet: Secondary | ICD-10-CM

## 2023-02-27 NOTE — Therapy (Signed)
OUTPATIENT PHYSICAL THERAPY LOWER EXTREMITY TREATMENT   Patient Name: Dana [name] MRN: [id number] DOB:Feb 27, 1966, 57 y.o., female Today's Date: 02/27/2023  END OF SESSION:  PT End of Session - 02/27/23 0852     Visit Number 11    Date for PT Re-Evaluation 04/15/23    PT Start Time 0847    PT Stop Time 0925    PT Time Calculation (min) 38 min    Equipment Utilized During Treatment Gait belt    Activity Tolerance Patient tolerated treatment well    Behavior During Therapy [facility] for tasks assessed/performed              Past Medical History:  Diagnosis Date   Anxiety    Back pain with radiation    Breast mass, right    Chronic pain    Chronic, continuous use of opioids    Complication of anesthesia 04/07/14   Allergic reaction to Lisinopril immediately following surgery   DJD (degenerative joint disease)    Fibromyalgia    Groin abscess    Headache(784.0)    History of IBS    Hypercholesterolemia    IBS (irritable bowel syndrome)    Lactose intolerance    Mild hypertension    Obesity    Tobacco use disorder    Umbilical hernia    Symptomatic   Past Surgical History:  Procedure Laterality Date   ABDOMINAL HYSTERECTOMY     ANTERIOR CERVICAL DECOMP/DISCECTOMY FUSION N/A 12/20/2015   Procedure: ANTERIOR CERVICAL DECOMPRESSION FUSION CERVICAL 4-5, CERVICAL 5-6, CERVICAL 6-7 WITH INSTRUMENTATION AND ALLOGRAFT;  Surgeon: Estill Bamberg, MD;  Location: [facility] OR;  Service: Orthopedics;  Laterality: N/A;  Anterior cervical decompression fusion, cervical 4-5, cervical 5-6, cervical 6-7 with instrumentation and allograft   BACK SURGERY     BILATERAL SALPINGECTOMY  09/03/2012   Procedure: BILATERAL SALPINGECTOMY;  Surgeon: Ok Edwards, MD;  Location: [facility] ORS;  Service: Gynecology;  Laterality: Bilateral;   BREAST BIOPSY Right 04/07/2014   Procedure: REMOVAL RIGHT BREAST MASS WITH WIRE LOCALIZATION;  Surgeon: Adolph Pollack, MD;  Location: [facility] OR;  Service: General;   Laterality: Right;   BREAST EXCISIONAL BIOPSY Left    x2   BREAST LUMPECTOMY     x2   CARPAL TUNNEL RELEASE Left 05/11/2019   Procedure: LEFT CARPAL TUNNEL RELEASE, RIGHT TENNIS ELBOW MARCAINE/DEPO MEDROL INJECTION UNDER ANESTHESIA;  Surgeon: Kerrin Champagne, MD;  Location: [facility] OR;  Service: Orthopedics;  Laterality: Left;   COLONOSCOPY W/ BIOPSIES  04/24/2012   per Dr. Leone Payor, clear, repeat in 10 yrs    disectomy     ESOPHAGOGASTRODUODENOSCOPY     FINGER SURGERY     Right index-excision of mass    FOOT SURGERY Right    Bone Spurs   IR FLUORO GUIDE CV LINE RIGHT  01/29/2023   IR REMOVAL TUN CV CATH W/O FL  02/08/2023   IR US GUIDE VASC ACCESS RIGHT  01/31/2023   LAPAROSCOPIC HYSTERECTOMY  09/03/2012   Procedure: HYSTERECTOMY TOTAL LAPAROSCOPIC;  Surgeon: Ok Edwards, MD;  Location: [facility];  Service: Gynecology;  Laterality: N/A;   LUMBAR DISC SURGERY     TUBAL LIGATION     UMBILICAL HERNIA REPAIR N/A 05/01/2018   Procedure: UMBILICAL HERNIA REPAIR;  Surgeon: Jimmye Norman, MD;  Location: [facility] OR;  Service: General;  Laterality: N/A;   Patient Active Problem List   Diagnosis Date Noted   CIDP (chronic inflammatory demyelinating polyneuropathy) (HCC) 01/28/2023   Gait abnormality 01/28/2023  Glaucoma 01/28/2023   Pain due to onychomycosis of toenails of both feet 01/21/2023   Wheelchair dependence 12/30/2022   Nerve pain 12/30/2022   Insomnia due to medical condition 12/30/2022   Acute inflammatory demyelinating polyneuropathy (HCC) 10/01/2022   UTI (urinary tract infection) 09/24/2022   Hypomagnesemia 09/24/2022   Weakness 09/23/2022   Hypokalemia 09/23/2022   B12 deficiency 09/05/2022   Folate deficiency 09/05/2022   Carpal tunnel syndrome, left upper limb 05/11/2019    Class: Chronic   Spinal stenosis of lumbar region with neurogenic claudication 08/20/2018   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   Primary osteoarthritis of right knee 02/27/2017   Right tennis elbow  11/11/2016   Muscle cramps 01/15/2016   Bilateral leg edema 01/08/2016   Radiculopathy 12/20/2015   Hyperglycemia 12/21/2014   Mastodynia, female 11/30/2014   S/P excision of fibroadenoma of breast 11/30/2014   Angioedema of lips 04/07/2014   Fibroadenoma of right breast 03/07/2014   Pelvic pain in female 06/19/2012   Menorrhagia 06/11/2012   SUI (stress urinary incontinence, female) 06/11/2012   HTN (hypertension) 06/11/2012   IBS (irritable bowel syndrome) - diarrhea predominant 03/17/2012   MICROSCOPIC HEMATURIA 11/30/2010   BREAST PAIN, RIGHT 11/30/2010   BREAST MASS, RIGHT 01/12/2010   HYPERCHOLESTEROLEMIA 12/07/2007   CIGARETTE SMOKER 12/07/2007   DEGENERATIVE JOINT DISEASE 12/07/2007   Low back pain with sciatica 12/07/2007   HEADACHE 12/07/2007   Obesity, Class III, BMI 40-49.9 (morbid obesity) (HCC) 08/03/2007   Anxiety state 08/03/2007    PCP: Dana Salisbury, MD  REFERRING PROVIDER: Nelwyn Salisbury, MD  REFERRING DIAG: G61.0 (ICD-10-CM) - Guillain Barr syndrome [facility])   THERAPY DIAG:  Other lack of coordination  Muscle weakness (generalized)  CIDP (chronic inflammatory demyelinating polyneuropathy) (HCC)  Other abnormalities of gait and mobility  Difficulty in walking, not elsewhere classified  Rationale for Evaluation and Treatment: Rehabilitation  ONSET DATE: 12/05/22  SUBJECTIVE:   SUBJECTIVE STATEMENT: Patient reports she is doing well, no new issues.  OBJECTIVE STATEMENT: 01/28/23 ASSESSMENT AND PLAN   Dana [name] is a 57 y.o. female   Demyelinating polyradiculoneuropathy             Symptom onset monophasic since September 2024, failed to respond to IVIG in January 2024,             EMG nerve conduction study showed demyelinating features, no evidence of axonal loss,             Talk with neurohospitalist Dr. Iver [name], ED triage, will send her for hospital admission for plasma exchange, please add on following labs, immunofixative protein  electrophoresis, ANA with reflex, SSA, SSB titer, iron panel             Starting outpatient IVIG prior authorization process,  Dana [name] is a 57 y.o. female who presented to the [facility] ED on 09/23/2022 with bilateral lower extremity weakness with decreased mobility that has progressed to both arms. She also reported numbness and tingling of the extremities. She was transferred to [facility] for MRI. 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  Inpatient Rehab F/B HHPT.  R knee injection 10/08/22 trigger point injections for worsening back pain 2/15  RA  PAIN:  Are you having pain? Yes: NPRS scale: up to 10/10 Pain location: all over, but her R knee pain is worst,  Pain description: Sharp Aggravating factors: Has difficulty standing up Relieving factors: Medicine helps somewhat  PRECAUTIONS: Fall  WEIGHT BEARING RESTRICTIONS: No  FALLS:  Has patient fallen in last 6 months? No  LIVING ENVIRONMENT: Lives with: lives with their family Lives in: House/apartment Stairs: [place]oh[place] a ramp Has following equipment at home: Environmental consultant - 2 wheeled, Wheelchair (power), Tour manager, bed side commode, Ramped entry, and [employer] lift  OCCUPATION: on disability due to RA and a fall several years ago  PLOF: Independent  PATIENT GOALS: walk  NEXT MD VISIT: She is scheduled to follow up with Dr. Serita Sheller with Physical Med/Rehab on 12-30-22. She is scheduled to follow up with Dr. Levert Feinstein at Neurology on 01-28-23.   OBJECTIVE:   DIAGNOSTIC FINDINGS:  Right knee radiographs 11/09/2020   FINDINGS: Severe patellofemoral joint space narrowing. Severe superior and mild inferior patellar degenerative osteophytosis. Moderate superior trochlear degenerative osteophytosis. No joint effusion. Severe medial compartment joint space narrowing with moderate peripheral medial and lateral compartment degenerative osteophytes.  COGNITION: Overall cognitive status: Within functional limits for  tasks assessed     SENSATION: Light touch: Impaired  and B hands and feet and lower legs  EDEMA:  BLE edema noted.  MUSCLE LENGTH: Hamstrings: WFL Thomas test: B tightness, to neutral  POSTURE: rounded shoulders and flexed trunk   PALPATION: No TTP  LOWER EXTREMITY ROM: BLE ROM limited in all planes an all joints due to body habitus   LOWER EXTREMITY MMT:  MMT Right eval Left eval  Hip flexion 3 3-  Hip extension 3- 3-  Hip abduction 3 3-  Hip adduction    Hip internal rotation    Hip external rotation    Knee flexion 4 4-  Knee extension 4- 4-  Ankle dorsiflexion 4- 4-  Ankle plantarflexion    Ankle inversion 4- 4-  Ankle eversion 4- 4-   (Blank rows = not tested)  FUNCTIONAL TESTS:  5 times sit to stand: unable to complete Timed up and go (TUG): unable to complete  Bed Mobility: Occasional min A for sup <> sit, rolled B on mat with great effort, light min A  TRANSFERS: Scooting transfer with CGA, stand pivot with CGA, she reports that she sometimes has difficulty rising due to her knee pain  GAIT: Distance walked: 0' Assistive device utilized:  shopping cart-will bring her RW from home. Level of assistance: Min A Comments: Stood and weight shifted, then stood again and took steps to turn to W/C.  TODAY'S TREATMENT:                                                                                                                              DATE:  02/27/23 NuStep L5 x 4 minutes, L ant/lat knee pain Fitter press blue and black 2 x 10 each direction Ambulated x 20' with RW and CGA Seated rotational slides- moving hands out to the side and back, 2 x 5 to each side. Added Leg extension to second set. Alternating step taps on 2" step with BUE support on RW B side stepping in front of  mat with RW, CGA, 2 x 5 steps to each side. Ambulated x 20' with RW and CGA  02/25/23 Ambulation with RW, 2 x 12', CGA, slides feet, but stable Standing activities with RW-Heel  raises, alternating arm raises, reaching into rotation with UUE support, then step back and rotate to reach W/C armrest, 5-10 reps each with rest breaks. Practiced stand pivot car transfers, demonstrating tech to husband, who declined to practice. She completed with RW and CGA.  02/18/23 Patient's functional status re-assessed after hospitalization-See goals for current status. NuStep L4 x 6 minutes  01/23/23 NuStep L5 x 6 minutes Transfers <> mat and NuStep with CGA for safety, no physical assistance required Supine with physioball- Rolling DKTC with gentle push for stretch and stability, Bridge, Bridge with B hip IR, quick LTR to activate abdominals. 10 reps each Heel slides x5 LLE Bridges x10  Standing with RW - 10s stands without any support x2  Small marches with RW 2x5  Transfer mat <>WC stand pivot with minA 2 people help Seated cable rows and ext 10# 2x10   01/21/23 B (15) and then single limb (2 x 10) pushes on Fitter Seated shoulder ext, rows, trunk rotation, all with G tband resistance, 10 reps each. Parallel bars, standing weight shifts, Heel raises x 10, sliding B hands on rail Single steps forward and back with alternating legs, RLE had partial buckle, required min A for stability, with a lot of pain for patient.  01/16/23 NuStep L5 x 6 minutes then L7 legs only x 2 minutes. Seated shoulder ext and rows with 10#, 2 x 10 reps each Seated chest press against 5# x 10 reps Scooting transfers to each side with S. Seated rotation to R, slide hands out, lift L leg onto the mat, then return. 3 x each direction. Ambulation x 8 steps with RW and min A. She had more difficulty stepping with LLE due to R knee pain today.  PATIENT EDUCATION:  Education details: POC Person educated: Patient Education method: Explanation Education comprehension: verbalized understanding  HOME EXERCISE PROGRAM: TBD  ASSESSMENT:  CLINICAL IMPRESSION: Patient reports no big changes. She did walk into  bathroom with RW by herself again. Progressed with strength training for BLE and trunk F/B gait with improved stability.  OBJECTIVE IMPAIRMENTS: Abnormal gait, decreased activity tolerance, decreased balance, decreased coordination, decreased endurance, difficulty walking, decreased ROM, decreased strength, and postural dysfunction.   ACTIVITY LIMITATIONS: carrying, lifting, bending, standing, squatting, stairs, transfers, and locomotion level  PARTICIPATION LIMITATIONS: meal prep, cleaning, laundry, driving, and shopping  PERSONAL FACTORS: Fitness, Past/current experiences, and 1 comorbidity: Recently diagnosed with GBS  are also affecting patient's functional outcome.   REHAB POTENTIAL: Good  CLINICAL DECISION MAKING: Evolving/moderate complexity  EVALUATION COMPLEXITY: Moderate   GOALS: Goals reviewed with patient? Yes  SHORT TERM GOALS: Target date: 11/28/22 I with initial HEP Baseline: Goal status: met 01/09/23  LONG TERM GOALS: Target date: 03/06/23  I with final HEP Baseline:  Goal status: INITIAL  2.  Decrease 5x STS to < 14 sec Baseline: unable to complete Goal status: 02/18/23-39 sec-ongoing  3.  Decrease TUG to < 20 sec Baseline: Unable to complete. Goal status: 02/18/23 1:55 Ongoing  4.  Patient will ambulate at least 300' with LRAD, MI, on level and unlevel surfaces. Baseline: Stand pivot transfer with CGA Goal status: 02/18/23-20', RW, CGA, slow, but stable throughout. Ongoing  5.  Perform all bed mobility with I Baseline: Occ min A on mat Goal status: 02/18/23-met  6.  Increase BLE strength to at least 4/5 throughout. Baseline: (3-)-3/5 Goal status: 02/18/23-RLE 4-/5, LLE 3+/5, Ongoing  7. Patient and husband will safely perform car transfers with LRAD Baseline: Currently uses van transportation Goal Status: 02/25/23 met  PLAN:  PT FREQUENCY: 1-2x/week  PT DURATION: 12 weeks  PLANNED INTERVENTIONS: Therapeutic exercises, Therapeutic activity,  Neuromuscular re-education, Balance training, Gait training, Patient/Family education, Self Care, Joint mobilization, Stair training, Dry Needling, Electrical stimulation, Cryotherapy, Moist heat, Ultrasound, Ionotophoresis 4mg /ml Dexamethasone, and Manual therapy  PLAN FOR NEXT SESSION: Initiate HEP  Oley Balm DPT 02/27/23 9:31 AM

## 2023-02-27 NOTE — Addendum Note (Signed)
Addended by: Joycelyn Schmid R on: 02/27/2023 05:34 PM   Modules accepted: Orders

## 2023-03-04 ENCOUNTER — Encounter: Payer: Self-pay | Admitting: Occupational Therapy

## 2023-03-04 ENCOUNTER — Ambulatory Visit: Payer: 59 | Admitting: Physical Therapy

## 2023-03-04 ENCOUNTER — Ambulatory Visit: Payer: 59 | Admitting: Occupational Therapy

## 2023-03-04 DIAGNOSIS — M6281 Muscle weakness (generalized): Secondary | ICD-10-CM

## 2023-03-04 DIAGNOSIS — R262 Difficulty in walking, not elsewhere classified: Secondary | ICD-10-CM

## 2023-03-04 DIAGNOSIS — R278 Other lack of coordination: Secondary | ICD-10-CM

## 2023-03-04 DIAGNOSIS — R2681 Unsteadiness on feet: Secondary | ICD-10-CM

## 2023-03-04 DIAGNOSIS — R2689 Other abnormalities of gait and mobility: Secondary | ICD-10-CM

## 2023-03-04 DIAGNOSIS — G6181 Chronic inflammatory demyelinating polyneuritis: Secondary | ICD-10-CM

## 2023-03-04 NOTE — Therapy (Signed)
OUTPATIENT PHYSICAL THERAPY LOWER EXTREMITY TREATMENT   Patient Name: Dana [name] MRN: [id number] DOB:07-30-1966, 57 y.o., female Today's Date: 03/04/2023  END OF SESSION:  PT End of Session - 03/04/23 0905     Visit Number 12    Date for PT Re-Evaluation 04/15/23    PT Start Time 0847    PT Stop Time 0925    PT Time Calculation (min) 38 min    Equipment Utilized During Treatment Gait belt    Activity Tolerance Patient tolerated treatment well    Behavior During Therapy [facility] for tasks assessed/performed              Past Medical History:  Diagnosis Date   Anxiety    Back pain with radiation    Breast mass, right    Chronic pain    Chronic, continuous use of opioids    Complication of anesthesia 04/07/14   Allergic reaction to Lisinopril immediately following surgery   DJD (degenerative joint disease)    Fibromyalgia    Groin abscess    Headache(784.0)    History of IBS    Hypercholesterolemia    IBS (irritable bowel syndrome)    Lactose intolerance    Mild hypertension    Obesity    Tobacco use disorder    Umbilical hernia    Symptomatic   Past Surgical History:  Procedure Laterality Date   ABDOMINAL HYSTERECTOMY     ANTERIOR CERVICAL DECOMP/DISCECTOMY FUSION N/A 12/20/2015   Procedure: ANTERIOR CERVICAL DECOMPRESSION FUSION CERVICAL 4-5, CERVICAL 5-6, CERVICAL 6-7 WITH INSTRUMENTATION AND ALLOGRAFT;  Surgeon: Estill Bamberg, MD;  Location: [facility] OR;  Service: Orthopedics;  Laterality: N/A;  Anterior cervical decompression fusion, cervical 4-5, cervical 5-6, cervical 6-7 with instrumentation and allograft   BACK SURGERY     BILATERAL SALPINGECTOMY  09/03/2012   Procedure: BILATERAL SALPINGECTOMY;  Surgeon: Ok Edwards, MD;  Location: [facility] ORS;  Service: Gynecology;  Laterality: Bilateral;   BREAST BIOPSY Right 04/07/2014   Procedure: REMOVAL RIGHT BREAST MASS WITH WIRE LOCALIZATION;  Surgeon: Adolph Pollack, MD;  Location: [facility] OR;  Service: General;   Laterality: Right;   BREAST EXCISIONAL BIOPSY Left    x2   BREAST LUMPECTOMY     x2   CARPAL TUNNEL RELEASE Left 05/11/2019   Procedure: LEFT CARPAL TUNNEL RELEASE, RIGHT TENNIS ELBOW MARCAINE/DEPO MEDROL INJECTION UNDER ANESTHESIA;  Surgeon: Kerrin Champagne, MD;  Location: [facility] OR;  Service: Orthopedics;  Laterality: Left;   COLONOSCOPY W/ BIOPSIES  04/24/2012   per Dr. Leone Payor, clear, repeat in 10 yrs    disectomy     ESOPHAGOGASTRODUODENOSCOPY     FINGER SURGERY     Right index-excision of mass    FOOT SURGERY Right    Bone Spurs   IR FLUORO GUIDE CV LINE RIGHT  01/29/2023   IR REMOVAL TUN CV CATH W/O FL  02/08/2023   IR US GUIDE VASC ACCESS RIGHT  01/31/2023   LAPAROSCOPIC HYSTERECTOMY  09/03/2012   Procedure: HYSTERECTOMY TOTAL LAPAROSCOPIC;  Surgeon: Ok Edwards, MD;  Location: [facility] OR[facility];  Service: Gynecology;  Laterality: N/A;   LUMBAR DISC SURGERY     TUBAL LIGATION     UMBILICAL HERNIA REPAIR N/A 05/01/2018   Procedure: UMBILICAL HERNIA REPAIR;  Surgeon: Jimmye Norman, MD;  Location: [facility] OR;  Service: General;  Laterality: N/A;   Patient Active Problem List   Diagnosis Date Noted   CIDP (chronic inflammatory demyelinating polyneuropathy) (HCC) 01/28/2023   Gait abnormality 01/28/2023  Glaucoma 01/28/2023   Pain due to onychomycosis of toenails of both feet 01/21/2023   Wheelchair dependence 12/30/2022   Nerve pain 12/30/2022   Insomnia due to medical condition 12/30/2022   Acute inflammatory demyelinating polyneuropathy (HCC) 10/01/2022   UTI (urinary tract infection) 09/24/2022   Hypomagnesemia 09/24/2022   Weakness 09/23/2022   Hypokalemia 09/23/2022   B12 deficiency 09/05/2022   Folate deficiency 09/05/2022   Carpal tunnel syndrome, left upper limb 05/11/2019    Class: Chronic   Spinal stenosis of lumbar region with neurogenic claudication 08/20/2018   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   Primary osteoarthritis of right knee 02/27/2017   Right tennis elbow  11/11/2016   Muscle cramps 01/15/2016   Bilateral leg edema 01/08/2016   Radiculopathy 12/20/2015   Hyperglycemia 12/21/2014   Mastodynia, female 11/30/2014   S/P excision of fibroadenoma of breast 11/30/2014   Angioedema of lips 04/07/2014   Fibroadenoma of right breast 03/07/2014   Pelvic pain in female 06/19/2012   Menorrhagia 06/11/2012   SUI (stress urinary incontinence, female) 06/11/2012   HTN (hypertension) 06/11/2012   IBS (irritable bowel syndrome) - diarrhea predominant 03/17/2012   MICROSCOPIC HEMATURIA 11/30/2010   BREAST PAIN, RIGHT 11/30/2010   BREAST MASS, RIGHT 01/12/2010   HYPERCHOLESTEROLEMIA 12/07/2007   CIGARETTE SMOKER 12/07/2007   DEGENERATIVE JOINT DISEASE 12/07/2007   Low back pain with sciatica 12/07/2007   HEADACHE 12/07/2007   Obesity, Class III, BMI 40-49.9 (morbid obesity) (HCC) 08/03/2007   Anxiety state 08/03/2007    PCP: Dana Salisbury, MD  REFERRING PROVIDER: Nelwyn Salisbury, MD  REFERRING DIAG: G61.0 (ICD-10-CM) - Guillain Barr syndrome [facility])   THERAPY DIAG:  Other lack of coordination  Muscle weakness (generalized)  Other abnormalities of gait and mobility  Unsteadiness on feet  Difficulty in walking, not elsewhere classified  CIDP (chronic inflammatory demyelinating polyneuropathy) (HCC)  Rationale for Evaluation and Treatment: Rehabilitation  ONSET DATE: 12/05/22  SUBJECTIVE:   SUBJECTIVE STATEMENT: Patient reports she is doing well, no new issues. She went out to a restaurant for the first time on Sunday!  OBJECTIVE STATEMENT: 01/28/23 ASSESSMENT AND PLAN   Dana [name] is a 57 y.o. female   Demyelinating polyradiculoneuropathy             Symptom onset monophasic since September 2024, failed to respond to IVIG in January 2024,             EMG nerve conduction study showed demyelinating features, no evidence of axonal loss,             Talk with neurohospitalist Dr. Iver [name], ED triage, will send her for hospital  admission for plasma exchange, please add on following labs, immunofixative protein electrophoresis, ANA with reflex, SSA, SSB titer, iron panel             Starting outpatient IVIG prior authorization process,  KANOSHA BOERST is a 57 y.o. female who [facility] ED on 09/23/2022 with bilateral lower extremity weakness with decreased mobility that has progressed to both arms. She also reported numbness and tingling of the extremities. She was transferred to [facility] for MRI. 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  Inpatient Rehab F/B HHPT.  R knee injection 10/08/22 trigger point injections for worsening back pain 2/15  RA  PAIN:  Are you having pain? Yes: NPRS scale: up to 10/10 Pain location: all over, but her R knee pain is worst,  Pain description: Sharp Aggravating factors: Has difficulty standing  up Relieving factors: Medicine helps somewhat  PRECAUTIONS: Fall  WEIGHT BEARING RESTRICTIONS: No  FALLS:  Has patient fallen in last 6 months? No  LIVING ENVIRONMENT: Lives with: lives with their family Lives in: House/apartment Stairs: Nohas a ramp Has following equipment at home: Environmental consultant - 2 wheeled, Wheelchair (power), Tour manager, bed side commode, Ramped entry, and [employer] lift  OCCUPATION: on disability due to RA and a fall several years ago  PLOF: Independent  PATIENT GOALS: walk  NEXT MD VISIT: She is scheduled to follow up with Dr. Serita Sheller with Physical Med/Rehab on 12-30-22. She is scheduled to follow up with Dr. Levert Feinstein at Neurology on 01-28-23.   OBJECTIVE:   DIAGNOSTIC FINDINGS:  Right knee radiographs 11/09/2020   FINDINGS: Severe patellofemoral joint space narrowing. Severe superior and mild inferior patellar degenerative osteophytosis. Moderate superior trochlear degenerative osteophytosis. No joint effusion. Severe medial compartment joint space narrowing with moderate peripheral medial and lateral compartment degenerative  osteophytes.  COGNITION: Overall cognitive status: Within functional limits for tasks assessed     SENSATION: Light touch: Impaired  and B hands and feet and lower legs  EDEMA:  BLE edema noted.  MUSCLE LENGTH: Hamstrings: WFL Thomas test: B tightness, to neutral  POSTURE: rounded shoulders and flexed trunk   PALPATION: No TTP  LOWER EXTREMITY ROM: BLE ROM limited in all planes an all joints due to body habitus   LOWER EXTREMITY MMT:  MMT Right eval Left eval  Hip flexion 3 3-  Hip extension 3- 3-  Hip abduction 3 3-  Hip adduction    Hip internal rotation    Hip external rotation    Knee flexion 4 4-  Knee extension 4- 4-  Ankle dorsiflexion 4- 4-  Ankle plantarflexion    Ankle inversion 4- 4-  Ankle eversion 4- 4-   (Blank rows = not tested)  FUNCTIONAL TESTS:  5 times sit to stand: unable to complete Timed up and go (TUG): unable to complete  Bed Mobility: Occasional min A for sup <> sit, rolled B on mat with great effort, light min A  TRANSFERS: Scooting transfer with CGA, stand pivot with CGA, she reports that she sometimes has difficulty rising due to her knee pain  GAIT: Distance walked: 0' Assistive device utilized:  shopping cart-will bring her RW from home. Level of assistance: Min A Comments: Stood and weight shifted, then stood again and took steps to turn to W/C.  TODAY'S TREATMENT:                                                                                                                              DATE:  03/04/23 Supine LE and trunk stabilization with BLE over physioball-bridge, bridge with B hip IR, SLR off the ball while stabilizing with opposite leg, 10 each, did have some cramping in R hip adductors Supine rolling to L side lie with R arm and leg held in the air for  trunk stability, 3 x each direction. Supine to side lie to sit from r and from L. Standing alternating taps on 4" step with BUE support on RW, CGA, 10 reps  each Ambulation with RW, 1 x 25', including 180 degree turn. CGA Standing weight shifts with straight cane and CGA, able to shift and lift heels alternately.  02/27/23 NuStep L5 x 4 minutes, L ant/lat knee pain Fitter press blue and black 2 x 10 each direction Ambulated x 20' with RW and CGA Seated rotational slides- moving hands out to the side and back, 2 x 5 to each side. Added Leg extension to second set. Alternating step taps on 2" step with BUE support on RW B side stepping in front of mat with RW, CGA, 2 x 5 steps to each side. Ambulated x 20' with RW and CGA  02/25/23 Ambulation with RW, 2 x 12', CGA, slides feet, but stable Standing activities with RW-Heel raises, alternating arm raises, reaching into rotation with UUE support, then step back and rotate to reach W/C armrest, 5-10 reps each with rest breaks. Practiced stand pivot car transfers, demonstrating tech to husband, who declined to practice. She completed with RW and CGA.  02/18/23 Patient's functional status re-assessed after hospitalization-See goals for current status. NuStep L4 x 6 minutes  01/23/23 NuStep L5 x 6 minutes Transfers <> mat and NuStep with CGA for safety, no physical assistance required Supine with physioball- Rolling DKTC with gentle push for stretch and stability, Bridge, Bridge with B hip IR, quick LTR to activate abdominals. 10 reps each Heel slides x5 LLE Bridges x10  Standing with RW - 10s stands without any support x2  Small marches with RW 2x5  Transfer mat <>WC stand pivot with minA 2 people help Seated cable rows and ext 10# 2x10   01/21/23 B (15) and then single limb (2 x 10) pushes on Fitter Seated shoulder ext, rows, trunk rotation, all with G tband resistance, 10 reps each. Parallel bars, standing weight shifts, Heel raises x 10, sliding B hands on rail Single steps forward and back with alternating legs, RLE had partial buckle, required min A for stability, with a lot of pain for  patient.  01/16/23 NuStep L5 x 6 minutes then L7 legs only x 2 minutes. Seated shoulder ext and rows with 10#, 2 x 10 reps each Seated chest press against 5# x 10 reps Scooting transfers to each side with S. Seated rotation to R, slide hands out, lift L leg onto the mat, then return. 3 x each direction. Ambulation x 8 steps with RW and min A. She had more difficulty stepping with LLE due to R knee pain today.  PATIENT EDUCATION:  Education details: POC Person educated: Patient Education method: Explanation Education comprehension: verbalized understanding  HOME EXERCISE PROGRAM: TBD  ASSESSMENT:  CLINICAL IMPRESSION: Patient reports no big changes. She started off seeming down, C/O increasing R knee pain. Treatment emphasized hip and LE strength and stability, avoiding a lot of knee flexion. She tolerated increased challenge today and was very steady with gait.  OBJECTIVE IMPAIRMENTS: Abnormal gait, decreased activity tolerance, decreased balance, decreased coordination, decreased endurance, difficulty walking, decreased ROM, decreased strength, and postural dysfunction.   ACTIVITY LIMITATIONS: carrying, lifting, bending, standing, squatting, stairs, transfers, and locomotion level  PARTICIPATION LIMITATIONS: meal prep, cleaning, laundry, driving, and shopping  PERSONAL FACTORS: Fitness, Past/current experiences, and 1 comorbidity: Recently diagnosed with GBS  are also affecting patient's functional outcome.   REHAB POTENTIAL: Good  CLINICAL DECISION MAKING: Evolving/moderate complexity  EVALUATION COMPLEXITY: Moderate   GOALS: Goals reviewed with patient? Yes  SHORT TERM GOALS: Target date: 11/28/22 I with initial HEP Baseline: Goal status: met 01/09/23  LONG TERM GOALS: Target date: 03/06/23  I with final HEP Baseline:  Goal status: INITIAL  2.  Decrease 5x STS to < 14 sec Baseline: unable to complete Goal status: 02/18/23-39 sec-ongoing  3.  Decrease TUG to < 20  sec Baseline: Unable to complete. Goal status: 02/18/23 1:55 Ongoing  4.  Patient will ambulate at least 300' with LRAD, MI, on level and unlevel surfaces. Baseline: Stand pivot transfer with CGA Goal status: 02/18/23-20', RW, CGA, slow, but stable throughout. Ongoing  5.  Perform all bed mobility with I Baseline: Occ min A on mat Goal status: 02/18/23-met  6.  Increase BLE strength to at least 4/5 throughout. Baseline: (3-)-3/5 Goal status: 02/18/23-RLE 4-/5, LLE 3+/5, Ongoing  7. Patient and husband will safely perform car transfers with LRAD Baseline: Currently uses van transportation Goal Status: 02/25/23 met  PLAN:  PT FREQUENCY: 1-2x/week  PT DURATION: 12 weeks  PLANNED INTERVENTIONS: Therapeutic exercises, Therapeutic activity, Neuromuscular re-education, Balance training, Gait training, Patient/Family education, Self Care, Joint mobilization, Stair training, Dry Needling, Electrical stimulation, Cryotherapy, Moist heat, Ultrasound, Ionotophoresis 4mg /ml Dexamethasone, and Manual therapy  PLAN FOR NEXT SESSION: Initiate HEP  Oley Balm DPT 03/04/23 9:31 AM

## 2023-03-04 NOTE — Therapy (Signed)
OUTPATIENT OCCUPATIONAL THERAPY NEURO Treatment  Patient Name: MARIEKA BELUE MRN: [id number] DOB:15-Jul-1966, 57 y.o., female Today's Date: 03/04/2023  PCP: Dr. Clent Ridges REFERRING PROVIDER: Dr. Gershon Crane  END OF SESSION:  OT End of Session - 03/04/23 0949     Visit Number 13    Number of Visits 35    Date for OT Re-Evaluation 05/13/23    Authorization Type UHC, Medicare    Authorization - Visit Number 13    Progress Note Due on Visit 20    OT Start Time [phone]    OT Stop Time 1014    OT Time Calculation (min) 41 min                  Past Medical History:  Diagnosis Date   Anxiety    Back pain with radiation    Breast mass, right    Chronic pain    Chronic, continuous use of opioids    Complication of anesthesia 04/07/14   Allergic reaction to Lisinopril immediately following surgery   DJD (degenerative joint disease)    Fibromyalgia    Groin abscess    Headache(784.0)    History of IBS    Hypercholesterolemia    IBS (irritable bowel syndrome)    Lactose intolerance    Mild hypertension    Obesity    Tobacco use disorder    Umbilical hernia    Symptomatic   Past Surgical History:  Procedure Laterality Date   ABDOMINAL HYSTERECTOMY     ANTERIOR CERVICAL DECOMP/DISCECTOMY FUSION N/A 12/20/2015   Procedure: ANTERIOR CERVICAL DECOMPRESSION FUSION CERVICAL 4-5, CERVICAL 5-6, CERVICAL 6-7 WITH INSTRUMENTATION AND ALLOGRAFT;  Surgeon: Estill Bamberg, MD;  Location: [facility] OR;  Service: Orthopedics;  Laterality: N/A;  Anterior cervical decompression fusion, cervical 4-5, cervical 5-6, cervical 6-7 with instrumentation and allograft   BACK SURGERY     BILATERAL SALPINGECTOMY  09/03/2012   Procedure: BILATERAL SALPINGECTOMY;  Surgeon: Ok Edwards, MD;  Location: [facility];  Service: Gynecology;  Laterality: Bilateral;   BREAST BIOPSY Right 04/07/2014   Procedure: REMOVAL RIGHT BREAST MASS WITH WIRE LOCALIZATION;  Surgeon: Adolph Pollack, MD;  Location: [facility] OR;  Service:  General;  Laterality: Right;   BREAST EXCISIONAL BIOPSY Left    x2   BREAST LUMPECTOMY     x2   CARPAL TUNNEL RELEASE Left 05/11/2019   Procedure: LEFT CARPAL TUNNEL RELEASE, RIGHT TENNIS ELBOW MARCAINE/DEPO MEDROL INJECTION UNDER ANESTHESIA;  Surgeon: Kerrin Champagne, MD;  Location: [facility] OR;  Service: Orthopedics;  Laterality: Left;   COLONOSCOPY W/ BIOPSIES  04/24/2012   per Dr. Leone Payor, clear, repeat in 10 yrs    disectomy     ESOPHAGOGASTRODUODENOSCOPY     FINGER SURGERY     Right index-excision of mass    FOOT SURGERY Right    Bone Spurs   IR FLUORO GUIDE CV LINE RIGHT  01/29/2023   IR REMOVAL TUN CV CATH W/O FL  02/08/2023   IR US GUIDE VASC ACCESS RIGHT  01/31/2023   LAPAROSCOPIC HYSTERECTOMY  09/03/2012   Procedure: HYSTERECTOMY TOTAL LAPAROSCOPIC;  Surgeon: Ok Edwards, MD;  Location: [facility];  Service: Gynecology;  Laterality: N/A;   LUMBAR DISC SURGERY     TUBAL LIGATION     UMBILICAL HERNIA REPAIR N/A 05/01/2018   Procedure: UMBILICAL HERNIA REPAIR;  Surgeon: Jimmye Norman, MD;  Location: [facility] OR;  Service: General;  Laterality: N/A;   Patient Active Problem List   Diagnosis Date Noted  CIDP (chronic inflammatory demyelinating polyneuropathy) (HCC) 01/28/2023   Gait abnormality 01/28/2023   Glaucoma 01/28/2023   Pain due to onychomycosis of toenails of both feet 01/21/2023   Wheelchair dependence 12/30/2022   Nerve pain 12/30/2022   Insomnia due to medical condition 12/30/2022   Acute inflammatory demyelinating polyneuropathy (HCC) 10/01/2022   UTI (urinary tract infection) 09/24/2022   Hypomagnesemia 09/24/2022   Weakness 09/23/2022   Hypokalemia 09/23/2022   B12 deficiency 09/05/2022   Folate deficiency 09/05/2022   Carpal tunnel syndrome, left upper limb 05/11/2019    Class: Chronic   Spinal stenosis of lumbar region with neurogenic claudication 08/20/2018   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   Primary osteoarthritis of right knee 02/27/2017   Right  tennis elbow 11/11/2016   Muscle cramps 01/15/2016   Bilateral leg edema 01/08/2016   Radiculopathy 12/20/2015   Hyperglycemia 12/21/2014   Mastodynia, female 11/30/2014   S/P excision of fibroadenoma of breast 11/30/2014   Angioedema of lips 04/07/2014   Fibroadenoma of right breast 03/07/2014   Pelvic pain in female 06/19/2012   Menorrhagia 06/11/2012   SUI (stress urinary incontinence, female) 06/11/2012   HTN (hypertension) 06/11/2012   IBS (irritable bowel syndrome) - diarrhea predominant 03/17/2012   MICROSCOPIC HEMATURIA 11/30/2010   BREAST PAIN, RIGHT 11/30/2010   BREAST MASS, RIGHT 01/12/2010   HYPERCHOLESTEROLEMIA 12/07/2007   CIGARETTE SMOKER 12/07/2007   DEGENERATIVE JOINT DISEASE 12/07/2007   Low back pain with sciatica 12/07/2007   HEADACHE 12/07/2007   Obesity, Class III, BMI 40-49.9 (morbid obesity) (HCC) 08/03/2007   Anxiety state 08/03/2007    ONSET DATE: 12/05/22- referral date  REFERRING DIAG: G61.0 (ICD-10-CM) - Guillain Barr syndrome (HCC)  THERAPY DIAG:    Rationale for Evaluation and Treatment: Rehabilitation  SUBJECTIVE:   SUBJECTIVE STATEMENT: Pt reports she feels better today, she took meds last visit without eating PERTINENT HISTORY: Pt is a 57 y/o female hospitalized 09/23/22  with progressive weakness and sensation changes, consistent with Guillain-Barr syndrome. MRI negative. LP results and assessment most consistent with Guillain-Barr syndrome.  Pt was d/c home from CIR 11/06/22 PMH includes: glaucoma anxiety, DJD, fibromyalgia, HTN, tobacco use, B-12 deficiency, OA, ACDF 2017.  Pt was re-hospitalized 01/28/2456 with CIDP (chronic inflammatory demyelinating polyneuropathy). Saw Neuro on day of admission, NCS showed demyelination, referred to hospital for PLEX. Initially diagnosed with an acute demyelinating neuropathy Jan 2024, treated with IVIG. PMH: Spinal stenosis, HTN, Obesity   PRECAUTIONS: Fall  WEIGHT BEARING RESTRICTIONS: no  PAIN:   Are you having pain? Yes:  NPRS scale: 4/10 Pain location: R knee Pain description: aching Aggravating factors: standing Relieving factors: rest    FALLS: Has patient fallen in last 6 months? No  LIVING ENVIRONMENT: Lives with: lives with their spouse Lives in: House/apartment Stairs: has ramp built Has following equipment at home:   BSC, steadi,  tub bench but can't access bathroom   PLOF: Independent  PATIENT GOALS: increase independence   OBJECTIVE:   HAND DOMINANCE: Right  ADLs: from initial eval Overall ADLs: assistance required, unable to access BR Transfers/ambulation related to ADLs: Eating: unable to cut food Grooming: set up UB Dressing: set up for bra and shirt LB Dressing: max A Toileting: uses BSC uses steadi Bathing: min-mod A in hospital  bed  Tub Shower transfers: n/a Equipment: Emergency planning/management officer  IADLs:dependent for IADLS   MOBILITY STATUS:  uses w/c  , per PT report minguard for transfer from w/c to mat    ACTIVITY TOLERANCE: Activity tolerance: Pt  fatigues quickly    UPPER EXTREMITY ROM:  see updates for 02/18/23 re-eval   Active ROM Right 02/18/23 Left 02/18/23  Shoulder flexion 130 120  Shoulder abduction 90 90  Shoulder adduction    Shoulder extension    Shoulder internal rotation    Shoulder external rotation    Elbow flexion    Elbow extension  -5  Wrist flexion    Wrist extension    Wrist ulnar deviation    Wrist radial deviation    Wrist pronation    Wrist supination    (Blank rows = not tested)  UPPER EXTREMITY MMT:   see below for re-eval 02/18/23  MMT Right 02/18/23 Left 02/18/23  Shoulder flexion 3+/5 3+/5  Shoulder abduction    Shoulder adduction    Shoulder extension    Shoulder internal rotation    Shoulder external rotation    Middle trapezius    Lower trapezius    Elbow flexion 4/5 4/5  Elbow extension 4-/5 4-/5  Wrist flexion    Wrist extension    Wrist ulnar deviation    Wrist radial deviation     Wrist pronation    Wrist supination      HAND FUNCTION: Grip strength: Right: 32 lbs; Left: 18 lbs, 02/18/23 R 55 lbs, L 35 lbs  COORDINATION: 9 Hole Peg test:initial eval  Right: 1 min 48 sec; Left: 4 pegs in 2 mins  Box and Blocks:  Right 35 blocks, Left  26 blocks 02/18/23- RUE 35.46, LUE 65 secs  SENSATION: Light touch: WFL Hot/Cold: Not tested    COGNITION: Overall cognitive status:NT   VISION: Subjective report: reports hx of glaucoma VISION ASSESSMENT: Not tested  OBSERVATIONS: Pt is eager to improve, pt's husband is very supportive   TODAY'S TREATMENT:                                                                                                                              DATE: 03/04/23 Red theraband exercises for shoulder abduction, rowing and extension 20 reps each, upgraded to green band for biceps curls and triceps extension 20 reps each, min v.c Arm bike x 8 mins, 4 mins each direction, level 2 for conditioning, pt transferred to and from seat with supervision. Coordination activities with LUE: flipping and dealing cards, min difficulty/ v.c  02/27/23-Arm bike x 6 mins level 2 for conditioning (Pt transfers to and from w/c with supervision) Placing large pegs into pegboard with LUE then removing for increased fine motor coordination, min difficulty/ v.c  Standing at countertop with supervision to place and remove items with right then left UE's, pt stood x 3 for 30 secs to 1 min 15 secs, rest breaks in between Gripper set at level 1 to pick up 1 inch blocks  with right and left UE's for sustained grip, min difficulty Pt reports amb to BR with walker without assist, therapist recommends supervision for safety.  02/25/23-Arm bike x 6 mins  level 2 for conditioning, Pt transferred to the seat for the first time with supervision Reviewed previously issued red theraband exercises for UE strength 20 reps each, min v.c  Fine motor coordination exercises with left and  right UE's, flipping playing cards and stacking coins with min difficulty using RUE, picking up coins and placing in container with LUE and flipping cards mod difficulty. Pt attempted placing small pegs into a pegboard with bilateral UE's max difficulty with LUE, mod difficulty with RUE, task was discontinued. Pt reports being a little off. She took her meds without eating and got into a disagreement with transportation this a.m.    02/18/23- re-eval performed and therapist discussed potential goals with patient.  PATIENT EDUCATION: Education details:  see above Person educated: Patient Education method: explanation, demonstration Education comprehension: verbalized understanding, returned demonstration.  HOME EXERCISE PROGRAM:  12/17/22- coordination HEP, cane exercises seated   GOALS: Goals reviewed with patient? Yes  SHORT TERM GOALS: Target date: 03/20/23  I with initial HEP Baseline: Goal status: met 01/21/23  2.  Pt will demonstrate improved UE functional use as evidenced increasing bilateral box/ blocks score by 4 blocks.  Baseline: R 35, L 26 Goal status  met 12/31/22 RUE :43:  LUE 35    3.  Pt will donn pants with mod A. Baseline: max A Goal status:met  02/18/23- pt performed mod I today  4. Pt will verbalize understanding of adapted strategies to maximize safety and I with ADLs/ IADLs .  Goal status:  ongoing, 02/18/23  5.  Pt will demonstrate ability to stand for functional activity with min A x 10 mins in prep for ADLs.  Goal status: ongoing, pt stood for 1 min today, 02/18/23  6.  Pt will perform w/c to [facility] transfers mod I  Goal status:  met, 02/18/23 7. I with updated HEP  Goal status- new  8.Pt will improve LUE grip strength by 8 lbs for increased ease with ADLs.  Goal status-new 9. Pt will perform tub transfer and shower with min A  Goal status: new   LONG TERM GOALS: Target date: 05/13/23 I with updated HEP  Goal status: ongoing, 02/18/23  2.  Pt will perform  LB dressing mod I. Baseline:max A Goal status: ongoing min A with LB dressing 02/18/23  3.  Pt will bathe with supervision/ set up. Baseline: min-mod A for sponge bath Goal status: met for sponge bathing at set up   4.  Pt will perform basic cooking at a w/c level mod I  Revised goal: Pt will perform basic cooking modified independently at a walker level.  Goal status:  met, 02/18/23  pt has performed simple tasks mod I w/c level, goal revised   5.  Pt will demonstrate improved fine motor coordination as evidenced by performing LUE 9 hole peg test in 2 mins or less.  Revised goal:Pt will demonstrate improved fine motor coordination as evidenced by performing 9 hole peg test in 55 secs or less  Goal status: initial goal met 6/4/246/4/24-  LUE 65 secs , goal revised  6.  Pt will demonstrate improved fine motor coordination as evidenced by performing RUE 9 hole peg test in 1 mins  30 secs or less.  Baseline: RUE  1 min 48 secs, LUE 4 pegs placed in 2 mins  Goal status: met 6/4/246/4/24- RUE 35.46,     7. Pt will perform shower with supervision using tub transfer bench    Goal status: new   8.  Pt will perform basic home management at a walker level mod I    Goal status: new ASSESSMENT:  CLINICAL IMPRESSION: Pt is progressing towards goals. She reports going out to eat for the first time over the weekend. Pt demonstrates improving strength and coordination   PERFORMANCE DEFICITS: in functional skills including ADLs, IADLs, coordination, dexterity, sensation, ROM, strength, flexibility, Fine motor control, Gross motor control, mobility, balance, endurance, decreased knowledge of precautions, decreased knowledge of use of DME, and UE functional use, cognitive skills including  and psychosocial skills including coping strategies, environmental adaptation, habits, interpersonal interactions, and routines and behaviors.   IMPAIRMENTS: are limiting patient from ADLs, IADLs, rest and sleep, play,  leisure, and social participation.   CO-MORBIDITIES: may have co-morbidities  that affects occupational performance. Patient will benefit from skilled OT to address above impairments and improve overall function.  MODIFICATION OR ASSISTANCE TO COMPLETE EVALUATION: No modification of tasks or assist necessary to complete an evaluation.  OT OCCUPATIONAL PROFILE AND HISTORY: Detailed assessment: Review of records and additional review of physical, cognitive, psychosocial history related to current functional performance.  CLINICAL DECISION MAKING: LOW - limited treatment options, no task modification necessary  REHAB POTENTIAL: Good  EVALUATION COMPLEXITY: Low    PLAN:  OT FREQUENCY: 2x/week  OT DURATION: 12 weeks  PLANNED INTERVENTIONS: self care/ADL training, therapeutic exercise, therapeutic activity, neuromuscular re-education, manual therapy, passive range of motion, gait training, balance training, stair training, functional mobility training, aquatic therapy, ultrasound, paraffin, moist heat, cryotherapy, patient/family education, energy conservation, coping strategies training, DME and/or AE instructions, and Re-evaluation  RECOMMENDED OTHER SERVICES: PT  CONSULTED AND AGREED WITH PLAN OF CARE: Patient and family member/caregiver  PLAN FOR NEXT SESSION:   continue to address UE strength and coordination, ADL strategies  Meridee Branum, OT 03/04/2023, 9:53 AM

## 2023-03-05 ENCOUNTER — Telehealth: Payer: Self-pay | Admitting: Family Medicine

## 2023-03-05 NOTE — Telephone Encounter (Signed)
Pt called to say her pharmacy does not have the HYDROmorphone (DILAUDID) 8 MG tablets  in stock and she would like an alternative medication sent to:    [id number] - [place], [place] - [address] Phone: [phone]  Fax: [phone]     Please advise.

## 2023-03-06 ENCOUNTER — Ambulatory Visit: Payer: 59 | Admitting: Occupational Therapy

## 2023-03-06 ENCOUNTER — Ambulatory Visit: Payer: 59 | Admitting: Physical Therapy

## 2023-03-07 ENCOUNTER — Other Ambulatory Visit (INDEPENDENT_AMBULATORY_CARE_PROVIDER_SITE_OTHER): Payer: Self-pay

## 2023-03-07 DIAGNOSIS — G6181 Chronic inflammatory demyelinating polyneuritis: Secondary | ICD-10-CM

## 2023-03-07 DIAGNOSIS — Z0289 Encounter for other administrative examinations: Secondary | ICD-10-CM

## 2023-03-07 MED ORDER — MORPHINE SULFATE ER 60 MG PO TBCR: 60.0000 mg | EXTENDED_RELEASE_TABLET | Freq: Two times a day (BID) | ORAL | 0 refills | Status: DC | PRN

## 2023-03-07 NOTE — Telephone Encounter (Signed)
Pt calling regarding morphine, pharmacy says they need a PA

## 2023-03-07 NOTE — Telephone Encounter (Signed)
Request sent to Prior Auth Team.

## 2023-03-07 NOTE — Telephone Encounter (Signed)
Pt called to say she is still waiting for MD to send in that PA for this medication.

## 2023-03-07 NOTE — Telephone Encounter (Signed)
I sent in Morphine for her to try instead

## 2023-03-08 LAB — COMPREHENSIVE METABOLIC PANEL
ALT: 17 IU/L (ref 0–32)
AST: 21 IU/L (ref 0–40)
Albumin: 4.1 g/dL (ref 3.8–4.9)
Alkaline Phosphatase: 133 IU/L — ABNORMAL HIGH (ref 44–121)
BUN/Creatinine Ratio: 18 (ref 9–23)
BUN: 21 mg/dL (ref 6–24)
Bilirubin Total: <0.2 mg/dL (ref 0.0–1.2)
CO2: 26 mmol/L (ref 20–29)
Calcium: 9 mg/dL (ref 8.7–10.2)
Chloride: 100 mmol/L (ref 96–106)
Creatinine, Ser: 1.17 mg/dL — ABNORMAL HIGH (ref 0.57–1.00)
Globulin, Total: 2.3 g/dL (ref 1.5–4.5)
Glucose: 107 mg/dL — ABNORMAL HIGH (ref 70–99)
Potassium: 4.8 mmol/L (ref 3.5–5.2)
Sodium: 143 mmol/L (ref 134–144)
Total Protein: 6.4 g/dL (ref 6.0–8.5)
eGFR: 54 mL/min/{1.73_m2} — ABNORMAL LOW (ref >59)

## 2023-03-08 LAB — CBC WITH DIFFERENTIAL/PLATELET
Basophils Absolute: 0.1 10*3/uL (ref 0.0–0.2)
Basos: 1 %
EOS (ABSOLUTE): 0.2 10*3/uL (ref 0.0–0.4)
Eos: 2 %
Hematocrit: 36.7 % (ref 34.0–46.6)
Hemoglobin: 11.8 g/dL (ref 11.1–15.9)
Immature Grans (Abs): 0 10*3/uL (ref 0.0–0.1)
Immature Granulocytes: 0 %
Lymphocytes Absolute: 1.8 10*3/uL (ref 0.7–3.1)
Lymphs: 24 %
MCH: 27.9 pg (ref 26.6–33.0)
MCHC: 32.2 g/dL (ref 31.5–35.7)
MCV: 87 fL (ref 79–97)
Monocytes Absolute: 0.6 10*3/uL (ref 0.1–0.9)
Monocytes: 8 %
Neutrophils Absolute: 5.1 10*3/uL (ref 1.4–7.0)
Neutrophils: 65 %
Platelets: 237 10*3/uL (ref 150–450)
RBC: 4.23 x10E6/uL (ref 3.77–5.28)
RDW: 13.7 % (ref 11.7–15.4)
WBC: 7.7 10*3/uL (ref 3.4–10.8)

## 2023-03-10 ENCOUNTER — Telehealth: Payer: Self-pay

## 2023-03-10 ENCOUNTER — Other Ambulatory Visit (HOSPITAL_COMMUNITY): Payer: Self-pay

## 2023-03-10 NOTE — Telephone Encounter (Signed)
Her 1st infusion has not yet been scheduled. We will keep watch for when that happens and do yours on the same day.

## 2023-03-10 NOTE — Telephone Encounter (Signed)
I was able to take with  patient, mild improvement after hospital PLEX, is in the process of getting intrafusion IVIG approved, please followup on schedule,   I would like to see her if possible before IVIG,

## 2023-03-10 NOTE — Telephone Encounter (Signed)
Pharmacy Patient Advocate Encounter   Received notification from [facility] that prior authorization for Morphine Sulfate ER 60MG  er tablets is required/requested.   PA submitted to [facility] via CoverMyMeds Key or (Medicaid) confirmation # [id number] Status is pending

## 2023-03-10 NOTE — Telephone Encounter (Signed)
PA submitted via CMM. Created new encounter for PA. Will route back to pool once determination has been made. 

## 2023-03-12 ENCOUNTER — Telehealth (INDEPENDENT_AMBULATORY_CARE_PROVIDER_SITE_OTHER): Payer: 59 | Admitting: Family Medicine

## 2023-03-12 ENCOUNTER — Encounter: Payer: Self-pay | Admitting: Family Medicine

## 2023-03-12 DIAGNOSIS — M5440 Lumbago with sciatica, unspecified side: Secondary | ICD-10-CM

## 2023-03-12 DIAGNOSIS — F1190 Opioid use, unspecified, uncomplicated: Secondary | ICD-10-CM

## 2023-03-12 DIAGNOSIS — G8929 Other chronic pain: Secondary | ICD-10-CM | POA: Diagnosis not present

## 2023-03-12 NOTE — Progress Notes (Signed)
Subjective:    Patient ID: Dana [name], female    DOB: 1966/08/16, 57 y.o.   MRN: [id number]  HPI Virtual Visit via Video Note  I connected with the patient on 03/12/23 at  9:30 AM EDT by a video enabled telemedicine application and verified that I am speaking with the correct person using two identifiers.  Location patient: home Location provider:work or home office Persons participating in the virtual visit: patient, provider  I discussed the limitations of evaluation and management by telemedicine and the availability of in person appointments. The patient expressed understanding and agreed to proceed.   HPI: Here for pain management. Her back pain has been stable. She is progressing with her PT. Last week we sent in a RX for MS Contin because hydromorphone was not available. She has not tried this yet because it required a PA. We heard this morning that the PA was approved.    ROS: See pertinent positives and negatives per HPI.  Past Medical History:  Diagnosis Date   Anxiety    Back pain with radiation    Breast mass, right    Chronic pain    Chronic, continuous use of opioids    Complication of anesthesia 04/07/14   Allergic reaction to Lisinopril immediately following surgery   DJD (degenerative joint disease)    Fibromyalgia    Groin abscess    Headache(784.0)    History of IBS    Hypercholesterolemia    IBS (irritable bowel syndrome)    Lactose intolerance    Mild hypertension    Obesity    Tobacco use disorder    Umbilical hernia    Symptomatic    Past Surgical History:  Procedure Laterality Date   ABDOMINAL HYSTERECTOMY     ANTERIOR CERVICAL DECOMP/DISCECTOMY FUSION N/A 12/20/2015   Procedure: ANTERIOR CERVICAL DECOMPRESSION FUSION CERVICAL 4-5, CERVICAL 5-6, CERVICAL 6-7 WITH INSTRUMENTATION AND ALLOGRAFT;  Surgeon: Estill Bamberg, MD;  Location: [facility] OR;  Service: Orthopedics;  Laterality: N/A;  Anterior cervical decompression fusion, cervical 4-5,  cervical 5-6, cervical 6-7 with instrumentation and allograft   BACK SURGERY     BILATERAL SALPINGECTOMY  09/03/2012   Procedure: BILATERAL SALPINGECTOMY;  Surgeon: Ok Edwards, MD;  Location: [facility];  Service: Gynecology;  Laterality: Bilateral;   BREAST BIOPSY Right 04/07/2014   Procedure: REMOVAL RIGHT BREAST MASS WITH WIRE LOCALIZATION;  Surgeon: Adolph Pollack, MD;  Location: [facility] OR;  Service: General;  Laterality: Right;   BREAST EXCISIONAL BIOPSY Left    x2   BREAST LUMPECTOMY     x2   CARPAL TUNNEL RELEASE Left 05/11/2019   Procedure: LEFT CARPAL TUNNEL RELEASE, RIGHT TENNIS ELBOW MARCAINE/DEPO MEDROL INJECTION UNDER ANESTHESIA;  Surgeon: Kerrin Champagne, MD;  Location: [facility] OR;  Service: Orthopedics;  Laterality: Left;   COLONOSCOPY W/ BIOPSIES  04/24/2012   per Dr. Leone Payor, clear, repeat in 10 yrs    disectomy     ESOPHAGOGASTRODUODENOSCOPY     FINGER SURGERY     Right index-excision of mass    FOOT SURGERY Right    Bone Spurs   IR FLUORO GUIDE CV LINE RIGHT  01/29/2023   IR REMOVAL TUN CV CATH W/O FL  02/08/2023   IR US GUIDE VASC ACCESS RIGHT  01/31/2023   LAPAROSCOPIC HYSTERECTOMY  09/03/2012   Procedure: HYSTERECTOMY TOTAL LAPAROSCOPIC;  Surgeon: Ok Edwards, MD;  Location: [facility];  Service: Gynecology;  Laterality: N/A;   LUMBAR DISC SURGERY     TUBAL  LIGATION     UMBILICAL HERNIA REPAIR N/A 05/01/2018   Procedure: UMBILICAL HERNIA REPAIR;  Surgeon: Jimmye Norman, MD;  Location: [facility] OR;  Service: General;  Laterality: N/A;    Family History  Problem Relation Age of Onset   Diabetes Mother    Diabetes Father    Breast cancer Maternal Aunt 46   Prostate cancer Paternal Grandfather      Current Outpatient Medications:    ALPRAZolam (XANAX) 0.5 MG tablet, Take 1 tablet (0.5 mg total) by mouth 2 (two) times daily as needed for anxiety., Disp: 180 tablet, Rfl: 1   Cyanocobalamin (VITAMIN B-12 PO), Take 1 tablet by mouth daily., Disp: , Rfl:    cyclobenzaprine  (FLEXERIL) 10 MG tablet, Take 1 tablet (10 mg total) by mouth 3 (three) times daily. (Patient taking differently: Take 10 mg by mouth 3 (three) times daily as needed for muscle spasms.), Disp: 90 tablet, Rfl: 5   dorzolamide-timolol (COSOPT) 2-0.5 % ophthalmic solution, Place 1 drop into both eyes 2 (two) times daily., Disp: , Rfl:    famotidine (PEPCID) 20 MG tablet, Take 20 mg by mouth 2 (two) times daily., Disp: , Rfl:    fluticasone (FLONASE) 50 MCG/ACT nasal spray, SPRAY 2 SPRAYS INTO EACH NOSTRIL EVERY DAY, Disp: 16 mL, Rfl: 11   folic acid (FOLVITE) 1 MG tablet, Take 1 tablet (1 mg total) by mouth daily., Disp: 90 tablet, Rfl: 3   furosemide (LASIX) 40 MG tablet, Take 1 tablet (40 mg total) by mouth 2 (two) times daily., Disp: 180 tablet, Rfl: 3   gabapentin (NEURONTIN) 300 MG capsule, Take 3 capsules (900 mg total) by mouth 3 (three) times daily., Disp: 270 capsule, Rfl: 5   hydrocerin (EUCERIN) CREA, Apply 1 Application topically 2 (two) times daily., Disp: , Rfl: 0   ibuprofen (ADVIL) 200 MG tablet, Take 400 mg by mouth 2 (two) times daily as needed for headache or moderate pain., Disp: , Rfl:    latanoprost (XALATAN) 0.005 % ophthalmic solution, Place 1 drop into both eyes at bedtime., Disp: , Rfl:    magnesium oxide (MAG-OX) 400 MG tablet, Take 1 tablet (400 mg total) by mouth at bedtime. (Patient taking differently: Take 200 mg by mouth at bedtime.), Disp: 90 tablet, Rfl: 3   metoprolol tartrate (LOPRESSOR) 50 MG tablet, Take 1 tablet (50 mg total) by mouth 2 (two) times daily., Disp: 180 tablet, Rfl: 3   morphine (MS CONTIN) 60 MG 12 hr tablet, Take 1 tablet (60 mg total) by mouth every 12 (twelve) hours as needed for pain., Disp: 60 tablet, Rfl: 0   Multiple Vitamin (MULTIVITAMIN WITH MINERALS) TABS tablet, Take 1 tablet by mouth daily., Disp: 30 tablet, Rfl: 0   pantoprazole (PROTONIX) 40 MG tablet, Take 1 tablet (40 mg total) by mouth daily., Disp: 90 tablet, Rfl: 3   potassium  chloride (KLOR-CON) 10 MEQ tablet, Take 10 mEq by mouth 2 (two) times daily., Disp: , Rfl:    promethazine (PHENERGAN) 25 MG tablet, Take 25 mg by mouth every 6 (six) hours as needed for nausea or vomiting., Disp: , Rfl:    senna-docusate (SENOKOT-S) 8.6-50 MG tablet, Take 1 tablet by mouth 2 (two) times daily. (Patient taking differently: Take 1 tablet by mouth 2 (two) times daily as needed for moderate constipation.), Disp: , Rfl:    traZODone (DESYREL) 50 MG tablet, TAKE 1-2 TABLETS BY MOUTH AT BEDTIME AS NEEDED FOR SLEEP. (Patient taking differently: Take 50 mg by mouth at bedtime  as needed for sleep.), Disp: 180 tablet, Rfl: 1  EXAM:  VITALS per patient if applicable:  GENERAL: alert, oriented, appears well and in no acute distress  HEENT: atraumatic, conjunttiva clear, no obvious abnormalities on inspection of external nose and ears  NECK: normal movements of the head and neck  LUNGS: on inspection no signs of respiratory distress, breathing rate appears normal, no obvious gross SOB, gasping or wheezing  CV: no obvious cyanosis  MS: moves all visible extremities without noticeable abnormality  PSYCH/NEURO: pleasant and cooperative, no obvious depression or anxiety, speech and thought processing grossly intact  ASSESSMENT AND PLAN: Pain management.  Indication for chronic opioid: low back pain Medication and dose: MSContin 60 mg # pills per month: 60 Last UDS date: 12-11-22 Opioid Treatment Agreement signed (Y/N): 12-19-17 Opioid Treatment Agreement last reviewed with patient:  03-12-23 [facility] reviewed this encounter (include red flags): Yes  She will talk to her pharmacy and hopefully pick up the MS Contin. She will then report back to [place] about it.  Gershon Crane, MD  Discussed the following assessment and plan:  No diagnosis found.     I discussed the assessment and treatment plan with the patient. The patient was provided an opportunity to ask questions and all were  answered. The patient agreed with the plan and demonstrated an understanding of the instructions.   The patient was advised to call back or seek an in-person evaluation if the symptoms worsen or if the condition fails to improve as anticipated.      Review of Systems     Objective:   Physical Exam        Assessment & Plan:

## 2023-03-13 ENCOUNTER — Encounter: Payer: Self-pay | Admitting: Occupational Therapy

## 2023-03-13 ENCOUNTER — Ambulatory Visit: Payer: 59 | Admitting: Physical Therapy

## 2023-03-13 ENCOUNTER — Ambulatory Visit: Payer: 59 | Admitting: Occupational Therapy

## 2023-03-13 ENCOUNTER — Encounter: Payer: Self-pay | Admitting: Physical Therapy

## 2023-03-13 DIAGNOSIS — R2689 Other abnormalities of gait and mobility: Secondary | ICD-10-CM

## 2023-03-13 DIAGNOSIS — R2681 Unsteadiness on feet: Secondary | ICD-10-CM

## 2023-03-13 DIAGNOSIS — R278 Other lack of coordination: Secondary | ICD-10-CM

## 2023-03-13 DIAGNOSIS — R262 Difficulty in walking, not elsewhere classified: Secondary | ICD-10-CM

## 2023-03-13 DIAGNOSIS — M6281 Muscle weakness (generalized): Secondary | ICD-10-CM

## 2023-03-13 DIAGNOSIS — G6181 Chronic inflammatory demyelinating polyneuritis: Secondary | ICD-10-CM

## 2023-03-13 DIAGNOSIS — G610 Guillain-Barre syndrome: Secondary | ICD-10-CM

## 2023-03-13 NOTE — Therapy (Signed)
OUTPATIENT PHYSICAL THERAPY LOWER EXTREMITY TREATMENT   Patient Name: Dana [name] MRN: [id number] DOB:1966/07/11, 57 y.o., female Today's Date: 03/13/2023  END OF SESSION:  PT End of Session - 03/13/23 0941     Visit Number 13    Date for PT Re-Evaluation 04/15/23    PT Start Time 0933    PT Stop Time 1013    PT Time Calculation (min) 40 min    Equipment Utilized During Treatment Gait belt    Activity Tolerance Patient tolerated treatment well    Behavior During Therapy [facility] for tasks assessed/performed              Past Medical History:  Diagnosis Date   Anxiety    Back pain with radiation    Breast mass, right    Chronic pain    Chronic, continuous use of opioids    Complication of anesthesia 04/07/14   Allergic reaction to Lisinopril immediately following surgery   DJD (degenerative joint disease)    Fibromyalgia    Groin abscess    Headache(784.0)    History of IBS    Hypercholesterolemia    IBS (irritable bowel syndrome)    Lactose intolerance    Mild hypertension    Obesity    Tobacco use disorder    Umbilical hernia    Symptomatic   Past Surgical History:  Procedure Laterality Date   ABDOMINAL HYSTERECTOMY     ANTERIOR CERVICAL DECOMP/DISCECTOMY FUSION N/A 12/20/2015   Procedure: ANTERIOR CERVICAL DECOMPRESSION FUSION CERVICAL 4-5, CERVICAL 5-6, CERVICAL 6-7 WITH INSTRUMENTATION AND ALLOGRAFT;  Surgeon: Estill Bamberg, MD;  Location: [facility] OR;  Service: Orthopedics;  Laterality: N/A;  Anterior cervical decompression fusion, cervical 4-5, cervical 5-6, cervical 6-7 with instrumentation and allograft   BACK SURGERY     BILATERAL SALPINGECTOMY  09/03/2012   Procedure: BILATERAL SALPINGECTOMY;  Surgeon: Ok Edwards, MD;  Location: [facility];  Service: Gynecology;  Laterality: Bilateral;   BREAST BIOPSY Right 04/07/2014   Procedure: REMOVAL RIGHT BREAST MASS WITH WIRE LOCALIZATION;  Surgeon: Adolph Pollack, MD;  Location[facility] OR;  Service: General;   Laterality: Right;   BREAST EXCISIONAL BIOPSY Left    x2   BREAST LUMPECTOMY     x2   CARPAL TUNNEL RELEASE Left 05/11/2019   Procedure: LEFT CARPAL TUNNEL RELEASE, RIGHT TENNIS ELBOW MARCAINE/DEPO MEDROL INJECTION UNDER ANESTHESIA;  Surgeon: Kerrin Champagne, MD;  Location: [facility] OR;  Service: Orthopedics;  Laterality: Left;   COLONOSCOPY W/ BIOPSIES  04/24/2012   per Dr. Leone Payor, clear, repeat in 10 yrs    disectomy     ESOPHAGOGASTRODUODENOSCOPY     FINGER SURGERY     Right index-excision of mass    FOOT SURGERY Right    Bone Spurs   IR FLUORO GUIDE CV LINE RIGHT  01/29/2023   IR REMOVAL TUN CV CATH W/O FL  02/08/2023   IR US GUIDE VASC ACCESS RIGHT  01/31/2023   LAPAROSCOPIC HYSTERECTOMY  09/03/2012   Procedure: HYSTERECTOMY TOTAL LAPAROSCOPIC;  Surgeon: Ok Edwards, MD;  Location: [facility];  Service: Gynecology;  Laterality: N/A;   LUMBAR DISC SURGERY     TUBAL LIGATION     UMBILICAL HERNIA REPAIR N/A 05/01/2018   Procedure: UMBILICAL HERNIA REPAIR;  Surgeon: Jimmye Norman, MD;  Location: [facility] OR;  Service: General;  Laterality: N/A;   Patient Active Problem List   Diagnosis Date Noted   CIDP (chronic inflammatory demyelinating polyneuropathy) (HCC) 01/28/2023   Gait abnormality 01/28/2023  Glaucoma 01/28/2023   Pain due to onychomycosis of toenails of both feet 01/21/2023   Wheelchair dependence 12/30/2022   Nerve pain 12/30/2022   Insomnia due to medical condition 12/30/2022   Acute inflammatory demyelinating polyneuropathy (HCC) 10/01/2022   UTI (urinary tract infection) 09/24/2022   Hypomagnesemia 09/24/2022   Weakness 09/23/2022   Hypokalemia 09/23/2022   B12 deficiency 09/05/2022   Folate deficiency 09/05/2022   Carpal tunnel syndrome, left upper limb 05/11/2019    Class: Chronic   Spinal stenosis of lumbar region with neurogenic claudication 08/20/2018   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   Primary osteoarthritis of right knee 02/27/2017   Right tennis elbow  11/11/2016   Muscle cramps 01/15/2016   Bilateral leg edema 01/08/2016   Radiculopathy 12/20/2015   Hyperglycemia 12/21/2014   Mastodynia, female 11/30/2014   S/P excision of fibroadenoma of breast 11/30/2014   Angioedema of lips 04/07/2014   Fibroadenoma of right breast 03/07/2014   Pelvic pain in female 06/19/2012   Menorrhagia 06/11/2012   SUI (stress urinary incontinence, female) 06/11/2012   HTN (hypertension) 06/11/2012   IBS (irritable bowel syndrome) - diarrhea predominant 03/17/2012   MICROSCOPIC HEMATURIA 11/30/2010   BREAST PAIN, RIGHT 11/30/2010   BREAST MASS, RIGHT 01/12/2010   HYPERCHOLESTEROLEMIA 12/07/2007   CIGARETTE SMOKER 12/07/2007   DEGENERATIVE JOINT DISEASE 12/07/2007   Low back pain with sciatica 12/07/2007   HEADACHE 12/07/2007   Obesity, Class III, BMI 40-49.9 (morbid obesity) (HCC) 08/03/2007   Anxiety state 08/03/2007    PCP: Nelwyn Salisbury, MD  REFERRING PROVIDER: Nelwyn Salisbury, MD  REFERRING DIAG: G61.0 (ICD-10-CM) - Guillain Barr syndrome [facility])   THERAPY DIAG:  Other lack of coordination  Muscle weakness (generalized)  Other abnormalities of gait and mobility  CIDP (chronic inflammatory demyelinating polyneuropathy) (HCC)  Difficulty in walking, not elsewhere classified  Unsteadiness on feet  Guillain Barr syndrome [facility])  Rationale for Evaluation and Treatment: Rehabilitation  ONSET DATE: 12/05/22  SUBJECTIVE:   SUBJECTIVE STATEMENT: Patient reports she has had a stomach bug, seems to feel better now.  OBJECTIVE STATEMENT: 01/28/23 ASSESSMENT AND PLAN   Dana [name] is a 57 y.o. female   Demyelinating polyradiculoneuropathy             Symptom onset monophasic since September 2024, failed to respond to IVIG in January 2024,             EMG nerve conduction study showed demyelinating features, no evidence of axonal loss,             Talk with neurohospitalist Dr. Iver Nestle, ED triage, will send her for hospital admission  for plasma exchange, please add on following labs, immunofixative protein electrophoresis, ANA with reflex, SSA, SSB titer, iron panel             Starting outpatient IVIG prior authorization process,  Dana [name] is a 57 y.o. female who [facility] ED on 09/23/2022 with bilateral lower extremity weakness with decreased mobility that has progressed to both arms. She also reported numbness and tingling of the extremities. She was transferred to [facility] for MRI. 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  Inpatient Rehab F/B HHPT.  R knee injection 10/08/22 trigger point injections for worsening back pain 2/15  RA  PAIN:  Are you having pain? Yes: NPRS scale: up to 10/10 Pain location: all over, but her R knee pain is worst,  Pain description: Sharp Aggravating factors: Has difficulty standing up Relieving factors:  Medicine helps somewhat  PRECAUTIONS: Fall  WEIGHT BEARING RESTRICTIONS: No  FALLS:  Has patient fallen in last 6 months? No  LIVING ENVIRONMENT: Lives with: lives with their family Lives in: House/apartment Stairs: [place]oh[place] a ramp Has following equipment at home: Environmental consultant - 2 wheeled, Wheelchair (power), Tour manager, bed side commode, Ramped entry, and [employer] lift  OCCUPATION: on disability due to RA and a fall several years ago  PLOF: Independent  PATIENT GOALS: walk  NEXT MD VISIT: She is scheduled to follow up with Dr. Serita Sheller with Physical Med/Rehab on 12-30-22. She is scheduled to follow up with Dr. Levert Feinstein at Neurology on 01-28-23.   OBJECTIVE:   DIAGNOSTIC FINDINGS:  Right knee radiographs 11/09/2020   FINDINGS: Severe patellofemoral joint space narrowing. Severe superior and mild inferior patellar degenerative osteophytosis. Moderate superior trochlear degenerative osteophytosis. No joint effusion. Severe medial compartment joint space narrowing with moderate peripheral medial and lateral compartment degenerative  osteophytes.  COGNITION: Overall cognitive status: Within functional limits for tasks assessed     SENSATION: Light touch: Impaired  and B hands and feet and lower legs  EDEMA:  BLE edema noted.  MUSCLE LENGTH: Hamstrings: WFL Thomas test: B tightness, to neutral  POSTURE: rounded shoulders and flexed trunk   PALPATION: No TTP  LOWER EXTREMITY ROM: BLE ROM limited in all planes an all joints due to body habitus   LOWER EXTREMITY MMT:  MMT Right eval Left eval  Hip flexion 3 3-  Hip extension 3- 3-  Hip abduction 3 3-  Hip adduction    Hip internal rotation    Hip external rotation    Knee flexion 4 4-  Knee extension 4- 4-  Ankle dorsiflexion 4- 4-  Ankle plantarflexion    Ankle inversion 4- 4-  Ankle eversion 4- 4-   (Blank rows = not tested)  FUNCTIONAL TESTS:  5 times sit to stand: unable to complete Timed up and go (TUG): unable to complete  Bed Mobility: Occasional min A for sup <> sit, rolled B on mat with great effort, light min A  TRANSFERS: Scooting transfer with CGA, stand pivot with CGA, she reports that she sometimes has difficulty rising due to her knee pain  GAIT: Distance walked: 0' Assistive device utilized:  shopping cart-will bring her RW from home. Level of assistance: Min A Comments: Stood and weight shifted, then stood again and took steps to turn to W/C.  TODAY'S TREATMENT:                                                                                                                              DATE:  03/13/23 Attempted to pedal bike, but it hurt her R knee NuStep-L5 x 4 min, then 3 x 30 sec fast pedal at L3 Ambulation with RW, CGA 1 x 20', then 1 x 40' including turn around. Standing stepping out and back in all directions, alternating legs, with BUE support on RW.  Increased speed and efficiency with repetition. Standing step forward to pick up an object, then back and turn to place it on mat behind her. 4 reps each side.  Difficulty performing in L stance without BUE support, but able to perform x 3 with UUE support in RLE SLS.  03/04/23 Supine LE and trunk stabilization with BLE over physioball-bridge, bridge with B hip IR, SLR off the ball while stabilizing with opposite leg, 10 each, did have some cramping in R hip adductors Supine rolling to L side lie with R arm and leg held in the air for trunk stability, 3 x each direction. Supine to side lie to sit from r and from L. Standing alternating taps on 4" step with BUE support on RW, CGA, 10 reps each Ambulation with RW, 1 x 25', including 180 degree turn. CGA Standing weight shifts with straight cane and CGA, able to shift and lift heels alternately.  02/27/23 NuStep L5 x 4 minutes, L ant/lat knee pain Fitter press blue and black 2 x 10 each direction Ambulated x 20' with RW and CGA Seated rotational slides- moving hands out to the side and back, 2 x 5 to each side. Added Leg extension to second set. Alternating step taps on 2" step with BUE support on RW B side stepping in front of mat with RW, CGA, 2 x 5 steps to each side. Ambulated x 20' with RW and CGA  02/25/23 Ambulation with RW, 2 x 12', CGA, slides feet, but stable Standing activities with RW-Heel raises, alternating arm raises, reaching into rotation with UUE support, then step back and rotate to reach W/C armrest, 5-10 reps each with rest breaks. Practiced stand pivot car transfers, demonstrating tech to husband, who declined to practice. She completed with RW and CGA.  02/18/23 Patient's functional status re-assessed after hospitalization-See goals for current status. NuStep L4 x 6 minutes  01/23/23 NuStep L5 x 6 minutes Transfers <> mat and NuStep with CGA for safety, no physical assistance required Supine with physioball- Rolling DKTC with gentle push for stretch and stability, Bridge, Bridge with B hip IR, quick LTR to activate abdominals. 10 reps each Heel slides x5 LLE Bridges x10   Standing with RW - 10s stands without any support x2  Small marches with RW 2x5  Transfer mat <>WC stand pivot with minA 2 people help Seated cable rows and ext 10# 2x10   01/21/23 B (15) and then single limb (2 x 10) pushes on Fitter Seated shoulder ext, rows, trunk rotation, all with G tband resistance, 10 reps each. Parallel bars, standing weight shifts, Heel raises x 10, sliding B hands on rail Single steps forward and back with alternating legs, RLE had partial buckle, required min A for stability, with a lot of pain for patient.  01/16/23 NuStep L5 x 6 minutes then L7 legs only x 2 minutes. Seated shoulder ext and rows with 10#, 2 x 10 reps each Seated chest press against 5# x 10 reps Scooting transfers to each side with S. Seated rotation to R, slide hands out, lift L leg onto the mat, then return. 3 x each direction. Ambulation x 8 steps with RW and min A. She had more difficulty stepping with LLE due to R knee pain today.  PATIENT EDUCATION:  Education details: POC Person educated: Patient Education method: Explanation Education comprehension: verbalized understanding  HOME EXERCISE PROGRAM: TBD  ASSESSMENT:  CLINICAL IMPRESSION: Patient reports no big changes. Treatment focused on BLE strengthening as well as  training automatic balance. She demonstrates improved control and speed of movement, still relies on UE support, but improved weight shifting and stepping.  OBJECTIVE IMPAIRMENTS: Abnormal gait, decreased activity tolerance, decreased balance, decreased coordination, decreased endurance, difficulty walking, decreased ROM, decreased strength, and postural dysfunction.   ACTIVITY LIMITATIONS: carrying, lifting, bending, standing, squatting, stairs, transfers, and locomotion level  PARTICIPATION LIMITATIONS: meal prep, cleaning, laundry, driving, and shopping  PERSONAL FACTORS: Fitness, Past/current experiences, and 1 comorbidity: Recently diagnosed with GBS  are also  affecting patient's functional outcome.   REHAB POTENTIAL: Good  CLINICAL DECISION MAKING: Evolving/moderate complexity  EVALUATION COMPLEXITY: Moderate   GOALS: Goals reviewed with patient? Yes  SHORT TERM GOALS: Target date: 11/28/22 I with initial HEP Baseline: Goal status: met 01/09/23  LONG TERM GOALS: Target date: 03/06/23  I with final HEP Baseline:  Goal status: INITIAL  2.  Decrease 5x STS to < 14 sec Baseline: unable to complete Goal status: 02/18/23-39 sec-ongoing  3.  Decrease TUG to < 20 sec Baseline: Unable to complete. Goal status: 02/18/23 1:55 Ongoing  4.  Patient will ambulate at least 300' with LRAD, MI, on level and unlevel surfaces. Baseline: Stand pivot transfer with CGA Goal status: 02/18/23-20', RW, CGA, slow, but stable throughout. Ongoing  5.  Perform all bed mobility with I Baseline: Occ min A on mat Goal status: 02/18/23-met  6.  Increase BLE strength to at least 4/5 throughout. Baseline: (3-)-3/5 Goal status: 02/18/23-RLE 4-/5, LLE 3+/5, Ongoing  7. Patient and husband will safely perform car transfers with LRAD Baseline: Currently uses van transportation Goal Status: 02/25/23 met  PLAN:  PT FREQUENCY: 1-2x/week  PT DURATION: 12 weeks  PLANNED INTERVENTIONS: Therapeutic exercises, Therapeutic activity, Neuromuscular re-education, Balance training, Gait training, Patient/Family education, Self Care, Joint mobilization, Stair training, Dry Needling, Electrical stimulation, Cryotherapy, Moist heat, Ultrasound, Ionotophoresis 4mg /ml Dexamethasone, and Manual therapy  PLAN FOR NEXT SESSION: Initiate HEP  Oley Balm DPT 03/13/23 10:15 AM

## 2023-03-13 NOTE — Therapy (Signed)
OUTPATIENT OCCUPATIONAL THERAPY NEURO Treatment  Patient Name: Dana [name] MRN: [id number] DOB:Jun 20, 1966, 57 y.o., female Today's Date: 03/13/2023  PCP: Dr. Clent Ridges REFERRING PROVIDER: Dr. Gershon Crane  END OF SESSION:  OT End of Session - 03/13/23 0816     Visit Number 14    Number of Visits 35    Date for OT Re-Evaluation 05/13/23    Authorization Type UHC, Medicare    OT Start Time [phone]    OT Stop Time 0928    OT Time Calculation (min) 41 min    Activity Tolerance Patient tolerated treatment well    Behavior During Therapy [facility] for tasks assessed/performed                  Past Medical History:  Diagnosis Date   Anxiety    Back pain with radiation    Breast mass, right    Chronic pain    Chronic, continuous use of opioids    Complication of anesthesia 04/07/14   Allergic reaction to Lisinopril immediately following surgery   DJD (degenerative joint disease)    Fibromyalgia    Groin abscess    Headache(784.0)    History of IBS    Hypercholesterolemia    IBS (irritable bowel syndrome)    Lactose intolerance    Mild hypertension    Obesity    Tobacco use disorder    Umbilical hernia    Symptomatic   Past Surgical History:  Procedure Laterality Date   ABDOMINAL HYSTERECTOMY     ANTERIOR CERVICAL DECOMP/DISCECTOMY FUSION N/A 12/20/2015   Procedure: ANTERIOR CERVICAL DECOMPRESSION FUSION CERVICAL 4-5, CERVICAL 5-6, CERVICAL 6-7 WITH INSTRUMENTATION AND ALLOGRAFT;  Surgeon: Estill Bamberg, MD;  Location: [facility] OR;  Service: Orthopedics;  Laterality: N/A;  Anterior cervical decompression fusion, cervical 4-5, cervical 5-6, cervical 6-7 with instrumentation and allograft   BACK SURGERY     BILATERAL SALPINGECTOMY  09/03/2012   Procedure: BILATERAL SALPINGECTOMY;  Surgeon: Ok Edwards, MD;  Location: [facility] ORS;  Service: Gynecology;  Laterality: Bilateral;   BREAST BIOPSY Right 04/07/2014   Procedure: REMOVAL RIGHT BREAST MASS WITH WIRE LOCALIZATION;  Surgeon: Adolph Pollack, MD;  Location: [facility] OR;  Service: General;  Laterality: Right;   BREAST EXCISIONAL BIOPSY Left    x2   BREAST LUMPECTOMY     x2   CARPAL TUNNEL RELEASE Left 05/11/2019   Procedure: LEFT CARPAL TUNNEL RELEASE, RIGHT TENNIS ELBOW MARCAINE/DEPO MEDROL INJECTION UNDER ANESTHESIA;  Surgeon: Kerrin Champagne, MD;  Location: [facility] OR;  Service: Orthopedics;  Laterality: Left;   COLONOSCOPY W/ BIOPSIES  04/24/2012   per Dr. Leone Payor, clear, repeat in 10 yrs    disectomy     ESOPHAGOGASTRODUODENOSCOPY     FINGER SURGERY     Right index-excision of mass    FOOT SURGERY Right    Bone Spurs   IR FLUORO GUIDE CV LINE RIGHT  01/29/2023   IR REMOVAL TUN CV CATH W/O FL  02/08/2023   IR US GUIDE VASC ACCESS RIGHT  01/31/2023   LAPAROSCOPIC HYSTERECTOMY  09/03/2012   Procedure: HYSTERECTOMY TOTAL LAPAROSCOPIC;  Surgeon: Ok Edwards, MD;  Location: [facility] OR[facility];  Service: Gynecology;  Laterality: N/A;   LUMBAR DISC SURGERY     TUBAL LIGATION     UMBILICAL HERNIA REPAIR N/A 05/01/2018   Procedure: UMBILICAL HERNIA REPAIR;  Surgeon: Jimmye Norman, MD;  Location: [facility] OR;  Service: General;  Laterality: N/A;   Patient Active Problem List   Diagnosis Date Noted  CIDP (chronic inflammatory demyelinating polyneuropathy) (HCC) 01/28/2023   Gait abnormality 01/28/2023   Glaucoma 01/28/2023   Pain due to onychomycosis of toenails of both feet 01/21/2023   Wheelchair dependence 12/30/2022   Nerve pain 12/30/2022   Insomnia due to medical condition 12/30/2022   Acute inflammatory demyelinating polyneuropathy (HCC) 10/01/2022   UTI (urinary tract infection) 09/24/2022   Hypomagnesemia 09/24/2022   Weakness 09/23/2022   Hypokalemia 09/23/2022   B12 deficiency 09/05/2022   Folate deficiency 09/05/2022   Carpal tunnel syndrome, left upper limb 05/11/2019    Class: Chronic   Spinal stenosis of lumbar region with neurogenic claudication 08/20/2018   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   Primary  osteoarthritis of right knee 02/27/2017   Right tennis elbow 11/11/2016   Muscle cramps 01/15/2016   Bilateral leg edema 01/08/2016   Radiculopathy 12/20/2015   Hyperglycemia 12/21/2014   Mastodynia, female 11/30/2014   S/P excision of fibroadenoma of breast 11/30/2014   Angioedema of lips 04/07/2014   Fibroadenoma of right breast 03/07/2014   Pelvic pain in female 06/19/2012   Menorrhagia 06/11/2012   SUI (stress urinary incontinence, female) 06/11/2012   HTN (hypertension) 06/11/2012   IBS (irritable bowel syndrome) - diarrhea predominant 03/17/2012   MICROSCOPIC HEMATURIA 11/30/2010   BREAST PAIN, RIGHT 11/30/2010   BREAST MASS, RIGHT 01/12/2010   HYPERCHOLESTEROLEMIA 12/07/2007   CIGARETTE SMOKER 12/07/2007   DEGENERATIVE JOINT DISEASE 12/07/2007   Low back pain with sciatica 12/07/2007   HEADACHE 12/07/2007   Obesity, Class III, BMI 40-49.9 (morbid obesity) (HCC) 08/03/2007   Anxiety state 08/03/2007    ONSET DATE: 12/05/22- referral date  REFERRING DIAG: G61.0 (ICD-10-CM) - Guillain Barr syndrome (HCC)  THERAPY DIAG:    Rationale for Evaluation and Treatment: Rehabilitation  SUBJECTIVE:   SUBJECTIVE STATEMENT: Pt reports she had a stomach bug last week PERTINENT HISTORY: Pt is a 57 y/o female hospitalized 09/23/22  with progressive weakness and sensation changes, consistent with Guillain-Barr syndrome. MRI negative. LP results and assessment most consistent with Guillain-Barr syndrome.  Pt was d/c home from CIR 11/06/22 PMH includes: glaucoma anxiety, DJD, fibromyalgia, HTN, tobacco use, B-12 deficiency, OA, ACDF 2017.  Pt was re-hospitalized 01/28/2456 with CIDP (chronic inflammatory demyelinating polyneuropathy). Saw Neuro on day of admission, NCS showed demyelination, referred to hospital for PLEX. Initially diagnosed with an acute demyelinating neuropathy Jan 2024, treated with IVIG. PMH: Spinal stenosis, HTN, Obesity   PRECAUTIONS: Fall  WEIGHT BEARING  RESTRICTIONS: no  PAIN:  Are you having pain? Yes:  NPRS scale: 4/10 Pain location: R knee Pain description: aching Aggravating factors: standing Relieving factors: rest    FALLS: Has patient fallen in last 6 months? No  LIVING ENVIRONMENT: Lives with: lives with their spouse Lives in: House/apartment Stairs: has ramp built Has following equipment at home:   BSC, steadi,  tub bench but can't access bathroom   PLOF: Independent  PATIENT GOALS: increase independence   OBJECTIVE:   HAND DOMINANCE: Right  ADLs: from initial eval Overall ADLs: assistance required, unable to access BR Transfers/ambulation related to ADLs: Eating: unable to cut food Grooming: set up UB Dressing: set up for bra and shirt LB Dressing: max A Toileting: uses BSC uses steadi Bathing: min-mod A in hospital  bed  Tub Shower transfers: n/a Equipment: Emergency planning/management officer  IADLs:dependent for IADLS   MOBILITY STATUS:  uses w/c  , per PT report minguard for transfer from w/c to mat    ACTIVITY TOLERANCE: Activity tolerance: Pt fatigues quickly  UPPER EXTREMITY ROM:  see updates for 02/18/23 re-eval   Active ROM Right 02/18/23 Left 02/18/23  Shoulder flexion 130 120  Shoulder abduction 90 90  Shoulder adduction    Shoulder extension    Shoulder internal rotation    Shoulder external rotation    Elbow flexion    Elbow extension  -5  Wrist flexion    Wrist extension    Wrist ulnar deviation    Wrist radial deviation    Wrist pronation    Wrist supination    (Blank rows = not tested)  UPPER EXTREMITY MMT:   see below for re-eval 02/18/23  MMT Right 02/18/23 Left 02/18/23  Shoulder flexion 3+/5 3+/5  Shoulder abduction    Shoulder adduction    Shoulder extension    Shoulder internal rotation    Shoulder external rotation    Middle trapezius    Lower trapezius    Elbow flexion 4/5 4/5  Elbow extension 4-/5 4-/5  Wrist flexion    Wrist extension    Wrist ulnar deviation    Wrist  radial deviation    Wrist pronation    Wrist supination      HAND FUNCTION: Grip strength: Right: 32 lbs; Left: 18 lbs, 02/18/23 R 55 lbs, L 35 lbs  COORDINATION: 9 Hole Peg test:initial eval  Right: 1 min 48 sec; Left: 4 pegs in 2 mins  Box and Blocks:  Right 35 blocks, Left  26 blocks 02/18/23- RUE 35.46, LUE 65 secs  SENSATION: Light touch: WFL Hot/Cold: Not tested    COGNITION: Overall cognitive status:NT   VISION: Subjective report: reports hx of glaucoma VISION ASSESSMENT: Not tested  OBSERVATIONS: Pt is eager to improve, pt's husband is very supportive   TODAY'S TREATMENT:                                                                                                                              DATE: 03/13/23-Pt reports she had a stomach bug and she wasn't feeling well. Placing large pegs into pegboard with LUE with occasional min difficulty, then removing with 3 pt pinch min v.c to avoid shoulder hike. Red theraband exercises for shoulder abduction, rowing and extension 15 reps each,  green band for biceps curls and triceps extension 15 reps each, min v.c Arm bike x 6 mins level 3 for conditioning, pt transfered to and from with min A Discussed safety for cooking and recommendation that if pt decides to fry fish, she performs with someone for safety, and she keeps her LUE away from hot surfaces due to sensory deficits. Pt verbalized understanding.   03/04/23 Red theraband exercises for shoulder abduction, rowing and extension 20 reps each, upgraded to green band for biceps curls and triceps extension 20 reps each, min v.c Arm bike x 8 mins, 4 mins each direction, level 2 for conditioning, pt transferred to and from seat with supervision. Coordination activities with LUE: flipping and dealing cards, min difficulty/  v.c  02/27/23-Arm bike x 6 mins level 2 for conditioning (Pt transfers to and from w/c with supervision) Placing large pegs into pegboard with LUE then  removing for increased fine motor coordination, min difficulty/ v.c  Standing at countertop with supervision to place and remove items with right then left UE's, pt stood x 3 for 30 secs to 1 min 15 secs, rest breaks in between Gripper set at level 1 to pick up 1 inch blocks  with right and left UE's for sustained grip, min difficulty Pt reports amb to BR with walker without assist, therapist recommends supervision for safety.  02/25/23-Arm bike x 6 mins level 2 for conditioning, Pt transferred to the seat for the first time with supervision Reviewed previously issued red theraband exercises for UE strength 20 reps each, min v.c  Fine motor coordination exercises with left and right UE's, flipping playing cards and stacking coins with min difficulty using RUE, picking up coins and placing in container with LUE and flipping cards mod difficulty. Pt attempted placing small pegs into a pegboard with bilateral UE's max difficulty with LUE, mod difficulty with RUE, task was discontinued. Pt reports being a little off. She took her meds without eating and got into a disagreement with transportation this a.m.    02/18/23- re-eval performed and therapist discussed potential goals with patient.  PATIENT EDUCATION: Education details:  safety for cooking fish, review of theraband  exercise Person educated: Patient Education method: explanation, demonstration Education comprehension: verbalized understanding, returned demonstration.  HOME EXERCISE PROGRAM:  12/17/22- coordination HEP, cane exercises seated   GOALS: Goals reviewed with patient? Yes  SHORT TERM GOALS: Target date: 03/20/23  I with initial HEP Baseline: Goal status: met 01/21/23  2.  Pt will demonstrate improved UE functional use as evidenced increasing bilateral box/ blocks score by 4 blocks.  Baseline: R 35, L 26 Goal status  met 12/31/22 RUE :43:  LUE 35    3.  Pt will donn pants with mod A. Baseline: max A Goal status:met   02/18/23- pt performed mod I today  4. Pt will verbalize understanding of adapted strategies to maximize safety and I with ADLs/ IADLs .  Goal status:  ongoing, 02/18/23  5.  Pt will demonstrate ability to stand for functional activity with min A x 10 mins in prep for ADLs.  Goal status: ongoing, pt stood for 1 min today, 02/18/23  6.  Pt will perform w/c to [facility] transfers mod I  Goal status:  met, 02/18/23 7. I with updated HEP  Goal status- new  8.Pt will improve LUE grip strength by 8 lbs for increased ease with ADLs.  Goal status-new 9. Pt will perform tub transfer and shower with min A  Goal status: new   LONG TERM GOALS: Target date: 05/13/23 I with updated HEP  Goal status: ongoing, 02/18/23  2.  Pt will perform LB dressing mod I. Baseline:max A Goal status: ongoing min A with LB dressing 02/18/23  3.  Pt will bathe with supervision/ set up. Baseline: min-mod A for sponge bath Goal status: met for sponge bathing at set up   4.  Pt will perform basic cooking at a w/c level mod I  Revised goal: Pt will perform basic cooking modified independently at a walker level.  Goal status:  met, 02/18/23  pt has performed simple tasks mod I w/c level, goal revised   5.  Pt will demonstrate improved fine motor coordination as evidenced by performing LUE 9  hole peg test in 2 mins or less.  Revised goal:Pt will demonstrate improved fine motor coordination as evidenced by performing 9 hole peg test in 55 secs or less  Goal status: initial goal met 6/4/246/4/24-  LUE 65 secs , goal revised  6.  Pt will demonstrate improved fine motor coordination as evidenced by performing RUE 9 hole peg test in 1 mins  30 secs or less.  Baseline: RUE  1 min 48 secs, LUE 4 pegs placed in 2 mins  Goal status: met 6/4/246/4/24- RUE 35.46,     7. Pt will perform shower with supervision using tub transfer bench    Goal status: new   8. Pt will perform basic home management at a walker level mod I    Goal status:  new ASSESSMENT:  CLINICAL IMPRESSION: Pt is progressing towards goals. Pt reports she had a stomach bug and hasn' t done much as a result. Pt demonstrates improving coordination in LUE.  PERFORMANCE DEFICITS: in functional skills including ADLs, IADLs, coordination, dexterity, sensation, ROM, strength, flexibility, Fine motor control, Gross motor control, mobility, balance, endurance, decreased knowledge of precautions, decreased knowledge of use of DME, and UE functional use, cognitive skills including  and psychosocial skills including coping strategies, environmental adaptation, habits, interpersonal interactions, and routines and behaviors.   IMPAIRMENTS: are limiting patient from ADLs, IADLs, rest and sleep, play, leisure, and social participation.   CO-MORBIDITIES: may have co-morbidities  that affects occupational performance. Patient will benefit from skilled OT to address above impairments and improve overall function.  MODIFICATION OR ASSISTANCE TO COMPLETE EVALUATION: No modification of tasks or assist necessary to complete an evaluation.  OT OCCUPATIONAL PROFILE AND HISTORY: Detailed assessment: Review of records and additional review of physical, cognitive, psychosocial history related to current functional performance.  CLINICAL DECISION MAKING: LOW - limited treatment options, no task modification necessary  REHAB POTENTIAL: Good  EVALUATION COMPLEXITY: Low    PLAN:  OT FREQUENCY: 2x/week  OT DURATION: 12 weeks  PLANNED INTERVENTIONS: self care/ADL training, therapeutic exercise, therapeutic activity, neuromuscular re-education, manual therapy, passive range of motion, gait training, balance training, stair training, functional mobility training, aquatic therapy, ultrasound, paraffin, moist heat, cryotherapy, patient/family education, energy conservation, coping strategies training, DME and/or AE instructions, and Re-evaluation  RECOMMENDED OTHER SERVICES:  PT  CONSULTED AND AGREED WITH PLAN OF CARE: Patient and family member/caregiver  PLAN FOR NEXT SESSION:   continue to address check on how cooking went, UE strength and coordination, ADL strategies  Ryeleigh Santore, OT 03/13/2023, 10:08 AM

## 2023-03-17 ENCOUNTER — Other Ambulatory Visit: Payer: Self-pay | Admitting: Family Medicine

## 2023-03-17 NOTE — Telephone Encounter (Signed)
Check on IVIG process   Patient report mild improvement after hospital PLEX, is in the process of getting intrafusion IVIG approved, please followup on schedule,    I would like to see her if possible before IVIG for baseline

## 2023-03-18 ENCOUNTER — Encounter: Payer: Self-pay | Admitting: Physical Therapy

## 2023-03-18 ENCOUNTER — Other Ambulatory Visit: Payer: Self-pay

## 2023-03-18 ENCOUNTER — Encounter: Payer: Self-pay | Admitting: Occupational Therapy

## 2023-03-18 ENCOUNTER — Ambulatory Visit: Payer: 59 | Admitting: Occupational Therapy

## 2023-03-18 ENCOUNTER — Ambulatory Visit: Payer: 59 | Attending: Family Medicine | Admitting: Physical Therapy

## 2023-03-18 ENCOUNTER — Other Ambulatory Visit (HOSPITAL_COMMUNITY): Payer: Self-pay

## 2023-03-18 ENCOUNTER — Encounter: Payer: Self-pay | Admitting: Neurology

## 2023-03-18 ENCOUNTER — Ambulatory Visit (INDEPENDENT_AMBULATORY_CARE_PROVIDER_SITE_OTHER): Payer: 59 | Admitting: Neurology

## 2023-03-18 VITALS — BP 109/77 | HR 81 | Resp 17 | Ht 64.0 in | Wt 279.0 lb

## 2023-03-18 DIAGNOSIS — R278 Other lack of coordination: Secondary | ICD-10-CM | POA: Diagnosis not present

## 2023-03-18 DIAGNOSIS — M6281 Muscle weakness (generalized): Secondary | ICD-10-CM | POA: Diagnosis present

## 2023-03-18 DIAGNOSIS — R262 Difficulty in walking, not elsewhere classified: Secondary | ICD-10-CM | POA: Insufficient documentation

## 2023-03-18 DIAGNOSIS — G6181 Chronic inflammatory demyelinating polyneuritis: Secondary | ICD-10-CM

## 2023-03-18 DIAGNOSIS — R531 Weakness: Secondary | ICD-10-CM

## 2023-03-18 DIAGNOSIS — R2681 Unsteadiness on feet: Secondary | ICD-10-CM

## 2023-03-18 DIAGNOSIS — G610 Guillain-Barre syndrome: Secondary | ICD-10-CM | POA: Diagnosis present

## 2023-03-18 DIAGNOSIS — R269 Unspecified abnormalities of gait and mobility: Secondary | ICD-10-CM

## 2023-03-18 DIAGNOSIS — R2689 Other abnormalities of gait and mobility: Secondary | ICD-10-CM

## 2023-03-18 MED ORDER — LATANOPROST 0.005 % OP SOLN
1.0000 [drp] | Freq: Every day | OPHTHALMIC | 1 refills | Status: AC
  Filled 2023-03-18: qty 2.5, 25d supply, fill #0

## 2023-03-18 MED ORDER — DORZOLAMIDE HCL-TIMOLOL MAL 2-0.5 % OP SOLN
1.0000 [drp] | Freq: Two times a day (BID) | OPHTHALMIC | 1 refills | Status: AC
  Filled 2023-03-18: qty 10, 90d supply, fill #0

## 2023-03-18 NOTE — Therapy (Signed)
OUTPATIENT OCCUPATIONAL THERAPY NEURO Treatment  Patient Name: Dana [name] MRN: [id number] DOB:October 01, 1965, 57 y.o., female Today's Date: 03/18/2023  PCP: Dr. Clent Ridges REFERRING PROVIDER: Dr. Gershon Crane  END OF SESSION:  OT End of Session - 03/18/23 1024     Visit Number 15    Number of Visits 35    Date for OT Re-Evaluation 05/13/23    Authorization Type UHC, Medicare    Authorization - Visit Number 14    Progress Note Due on Visit 20    OT Start Time 1015    OT Stop Time 1058    OT Time Calculation (min) 43 min    Activity Tolerance Patient tolerated treatment well    Behavior During Therapy [facility] for tasks assessed/performed                  Past Medical History:  Diagnosis Date   Anxiety    Back pain with radiation    Breast mass, right    Chronic pain    Chronic, continuous use of opioids    Complication of anesthesia 04/07/14   Allergic reaction to Lisinopril immediately following surgery   DJD (degenerative joint disease)    Fibromyalgia    Groin abscess    Headache(784.0)    History of IBS    Hypercholesterolemia    IBS (irritable bowel syndrome)    Lactose intolerance    Mild hypertension    Obesity    Tobacco use disorder    Umbilical hernia    Symptomatic   Past Surgical History:  Procedure Laterality Date   ABDOMINAL HYSTERECTOMY     ANTERIOR CERVICAL DECOMP/DISCECTOMY FUSION N/A 12/20/2015   Procedure: ANTERIOR CERVICAL DECOMPRESSION FUSION CERVICAL 4-5, CERVICAL 5-6, CERVICAL 6-7 WITH INSTRUMENTATION AND ALLOGRAFT;  Surgeon: Estill Bamberg, MD;  Location: [facility] OR;  Service: Orthopedics;  Laterality: N/A;  Anterior cervical decompression fusion, cervical 4-5, cervical 5-6, cervical 6-7 with instrumentation and allograft   BACK SURGERY     BILATERAL SALPINGECTOMY  09/03/2012   Procedure: BILATERAL SALPINGECTOMY;  Surgeon: Ok Edwards, MD;  Location: [facility];  Service: Gynecology;  Laterality: Bilateral;   BREAST BIOPSY Right 04/07/2014    Procedure: REMOVAL RIGHT BREAST MASS WITH WIRE LOCALIZATION;  Surgeon: Adolph Pollack, MD;  Location: [facility] OR;  Service: General;  Laterality: Right;   BREAST EXCISIONAL BIOPSY Left    x2   BREAST LUMPECTOMY     x2   CARPAL TUNNEL RELEASE Left 05/11/2019   Procedure: LEFT CARPAL TUNNEL RELEASE, RIGHT TENNIS ELBOW MARCAINE/DEPO MEDROL INJECTION UNDER ANESTHESIA;  Surgeon: Kerrin Champagne, MD;  Location: [facility] OR;  Service: Orthopedics;  Laterality: Left;   COLONOSCOPY W/ BIOPSIES  04/24/2012   per Dr. Leone Payor, clear, repeat in 10 yrs    disectomy     ESOPHAGOGASTRODUODENOSCOPY     FINGER SURGERY     Right index-excision of mass    FOOT SURGERY Right    Bone Spurs   IR FLUORO GUIDE CV LINE RIGHT  01/29/2023   IR REMOVAL TUN CV CATH W/O FL  02/08/2023   IR US GUIDE VASC ACCESS RIGHT  01/31/2023   LAPAROSCOPIC HYSTERECTOMY  09/03/2012   Procedure: HYSTERECTOMY TOTAL LAPAROSCOPIC;  Surgeon: Ok Edwards, MD;  Location: [facility];  Service: Gynecology;  Laterality: N/A;   LUMBAR DISC SURGERY     TUBAL LIGATION     UMBILICAL HERNIA REPAIR N/A 05/01/2018   Procedure: UMBILICAL HERNIA REPAIR;  Surgeon: Jimmye Norman, MD;  Location: [facility] OR;  Service: General;  Laterality: N/A;   Patient Active Problem List   Diagnosis Date Noted   CIDP (chronic inflammatory demyelinating polyneuropathy) (HCC) 01/28/2023   Gait abnormality 01/28/2023   Glaucoma 01/28/2023   Pain due to onychomycosis of toenails of both feet 01/21/2023   Wheelchair dependence 12/30/2022   Nerve pain 12/30/2022   Insomnia due to medical condition 12/30/2022   Acute inflammatory demyelinating polyneuropathy (HCC) 10/01/2022   UTI (urinary tract infection) 09/24/2022   Hypomagnesemia 09/24/2022   Weakness 09/23/2022   Hypokalemia 09/23/2022   B12 deficiency 09/05/2022   Folate deficiency 09/05/2022   Carpal tunnel syndrome, left upper limb 05/11/2019    Class: Chronic   Spinal stenosis of lumbar region with neurogenic claudication  08/20/2018   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   Primary osteoarthritis of right knee 02/27/2017   Right tennis elbow 11/11/2016   Muscle cramps 01/15/2016   Bilateral leg edema 01/08/2016   Radiculopathy 12/20/2015   Hyperglycemia 12/21/2014   Mastodynia, female 11/30/2014   S/P excision of fibroadenoma of breast 11/30/2014   Angioedema of lips 04/07/2014   Fibroadenoma of right breast 03/07/2014   Pelvic pain in female 06/19/2012   Menorrhagia 06/11/2012   SUI (stress urinary incontinence, female) 06/11/2012   HTN (hypertension) 06/11/2012   IBS (irritable bowel syndrome) - diarrhea predominant 03/17/2012   MICROSCOPIC HEMATURIA 11/30/2010   BREAST PAIN, RIGHT 11/30/2010   BREAST MASS, RIGHT 01/12/2010   HYPERCHOLESTEROLEMIA 12/07/2007   CIGARETTE SMOKER 12/07/2007   DEGENERATIVE JOINT DISEASE 12/07/2007   Low back pain with sciatica 12/07/2007   HEADACHE 12/07/2007   Obesity, Class III, BMI 40-49.9 (morbid obesity) (HCC) 08/03/2007   Anxiety state 08/03/2007    ONSET DATE: 12/05/22- referral date  REFERRING DIAG: G61.0 (ICD-10-CM) - Guillain Barr syndrome (HCC)  THERAPY DIAG:    Rationale for Evaluation and Treatment: Rehabilitation  SUBJECTIVE:   SUBJECTIVE STATEMENT: Pt reports frustration over her situation PERTINENT HISTORY: Pt is a 57 y/o female hospitalized 09/23/22  with progressive weakness and sensation changes, consistent with Guillain-Barr syndrome. MRI negative. LP results and assessment most consistent with Guillain-Barr syndrome.  Pt was d/c home from CIR 11/06/22 PMH includes: glaucoma anxiety, DJD, fibromyalgia, HTN, tobacco use, B-12 deficiency, OA, ACDF 2017.  Pt was re-hospitalized 01/28/2456 with CIDP (chronic inflammatory demyelinating polyneuropathy). Saw Neuro on day of admission, NCS showed demyelination, referred to hospital for PLEX. Initially diagnosed with an acute demyelinating neuropathy Jan 2024, treated with IVIG. PMH: Spinal  stenosis, HTN, Obesity   PRECAUTIONS: Fall  WEIGHT BEARING RESTRICTIONS: no  PAIN:  Are you having pain? Yes:  NPRS scale: mild pain Pain location: R knee Pain description: aching Aggravating factors: standing Relieving factors: rest    FALLS: Has patient fallen in last 6 months? No  LIVING ENVIRONMENT: Lives with: lives with their spouse Lives in: House/apartment Stairs: has ramp built Has following equipment at home:   BSC, steadi,  tub bench but can't access bathroom   PLOF: Independent  PATIENT GOALS: increase independence   OBJECTIVE:   HAND DOMINANCE: Right  ADLs: from initial eval Overall ADLs: assistance required, unable to access BR Transfers/ambulation related to ADLs: Eating: unable to cut food Grooming: set up UB Dressing: set up for bra and shirt LB Dressing: max A Toileting: uses BSC uses steadi Bathing: min-mod A in hospital  bed  Tub Shower transfers: n/a Equipment: Emergency planning/management officer  IADLs:dependent for IADLS   MOBILITY STATUS:  uses w/c  , per PT report minguard for transfer  from w/c to mat    ACTIVITY TOLERANCE: Activity tolerance: Pt fatigues quickly    UPPER EXTREMITY ROM:  see updates for 02/18/23 re-eval   Active ROM Right 02/18/23 Left 02/18/23  Shoulder flexion 130 120  Shoulder abduction 90 90  Shoulder adduction    Shoulder extension    Shoulder internal rotation    Shoulder external rotation    Elbow flexion    Elbow extension  -5  Wrist flexion    Wrist extension    Wrist ulnar deviation    Wrist radial deviation    Wrist pronation    Wrist supination    (Blank rows = not tested)  UPPER EXTREMITY MMT:   see below for re-eval 02/18/23  MMT Right 02/18/23 Left 02/18/23  Shoulder flexion 3+/5 3+/5  Shoulder abduction    Shoulder adduction    Shoulder extension    Shoulder internal rotation    Shoulder external rotation    Middle trapezius    Lower trapezius    Elbow flexion 4/5 4/5  Elbow extension 4-/5 4-/5   Wrist flexion    Wrist extension    Wrist ulnar deviation    Wrist radial deviation    Wrist pronation    Wrist supination      HAND FUNCTION: Grip strength: Right: 32 lbs; Left: 18 lbs, 02/18/23 R 55 lbs, L 35 lbs  COORDINATION: 9 Hole Peg test:initial eval  Right: 1 min 48 sec; Left: 4 pegs in 2 mins  Box and Blocks:  Right 35 blocks, Left  26 blocks 02/18/23- RUE 35.46, LUE 65 secs  SENSATION: Light touch: WFL Hot/Cold: Not tested    COGNITION: Overall cognitive status:NT   VISION: Subjective report: reports hx of glaucoma VISION ASSESSMENT: Not tested  OBSERVATIONS: Pt is eager to improve, pt's husband is very supportive   TODAY'S TREATMENT:                                                                                                                              DATE: 03/18/23- Pt amb across gym with RW and minguard to arm bike.  Arm bike x 7 mins level 1  for conditioning Placing small pegs into pegboard with LUE for increased fine motor coordination, pt with improved performance and coordination today. Pt walked 3 ft from chair to counter and stood to place and remove items from overhead shelves with 1 seated rest break. Discussion with patient regarding her home situation,. Pt reports frustration and she feels like she is a burden on husband. Therapist recommends pt seeks counseling. Shoulder abduction  with red band biceps curls with green band  20 reps for UE strengthening   03/13/23-Pt reports she had a stomach bug and she wasn't feeling well. Placing large pegs into pegboard with LUE with occasional min difficulty, then removing with 3 pt pinch min v.c to avoid shoulder hike. Red theraband exercises for shoulder abduction, rowing and extension 15 reps each,  green band for biceps curls and triceps extension 15 reps each, min v.c Arm bike x 6 mins level 3 for conditioning, pt transfered to and from with min A Discussed safety for cooking and recommendation that if  pt decides to fry fish, she performs with someone for safety, and she keeps her LUE away from hot surfaces due to sensory deficits. Pt verbalized understanding.   03/04/23 Red theraband exercises for shoulder abduction, rowing and extension 20 reps each, upgraded to green band for biceps curls and triceps extension 20 reps each, min v.c Arm bike x 8 mins, 4 mins each direction, level 2 for conditioning, pt transferred to and from seat with supervision. Coordination activities with LUE: flipping and dealing cards, min difficulty/ v.c  02/27/23-Arm bike x 6 mins level 2 for conditioning (Pt transfers to and from w/c with supervision) Placing large pegs into pegboard with LUE then removing for increased fine motor coordination, min difficulty/ v.c  Standing at countertop with supervision to place and remove items with right then left UE's, pt stood x 3 for 30 secs to 1 min 15 secs, rest breaks in between Gripper set at level 1 to pick up 1 inch blocks  with right and left UE's for sustained grip, min difficulty Pt reports amb to BR with walker without assist, therapist recommends supervision for safety.  02/25/23-Arm bike x 6 mins level 2 for conditioning, Pt transferred to the seat for the first time with supervision Reviewed previously issued red theraband exercises for UE strength 20 reps each, min v.c  Fine motor coordination exercises with left and right UE's, flipping playing cards and stacking coins with min difficulty using RUE, picking up coins and placing in container with LUE and flipping cards mod difficulty. Pt attempted placing small pegs into a pegboard with bilateral UE's max difficulty with LUE, mod difficulty with RUE, task was discontinued. Pt reports being a little off. She took her meds without eating and got into a disagreement with transportation this a.m.    02/18/23- re-eval performed and therapist discussed potential goals with patient.  PATIENT EDUCATION: Education details:   safety for cooking fish, review of theraband  exercise Person educated: Patient Education method: explanation, demonstration Education comprehension: verbalized understanding, returned demonstration.  HOME EXERCISE PROGRAM:  12/17/22- coordination HEP, cane exercises seated   GOALS: Goals reviewed with patient? Yes  SHORT TERM GOALS: Target date: 03/20/23  I with initial HEP Baseline: Goal status: met 01/21/23  2.  Pt will demonstrate improved UE functional use as evidenced increasing bilateral box/ blocks score by 4 blocks.  Baseline: R 35, L 26 Goal status  met 12/31/22 RUE :43:  LUE 35    3.  Pt will donn pants with mod A. Baseline: max A Goal status:met  02/18/23- pt performed mod I today  4. Pt will verbalize understanding of adapted strategies to maximize safety and I with ADLs/ IADLs .  Goal status:  ongoing, 02/18/23  5.  Pt will demonstrate ability to stand for functional activity with min A x 10 mins in prep for ADLs.  Goal status: ongoing, pt stood for 1 min today, 02/18/23  6.  Pt will perform w/c to [facility] transfers mod I  Goal status:  met, 02/18/23 7. I with updated HEP  Goal status- ongoing 03/18/23  8.Pt will improve LUE grip strength by 8 lbs for increased ease with ADLs.  Goal status-ongoing 03/18/23 9. Pt will perform tub transfer and shower with min A  Goal status:  ongoing, 03/18/23   LONG TERM GOALS: Target date: 05/13/23 I with updated HEP  Goal status: ongoing, 02/18/23  2.  Pt will perform LB dressing mod I. Baseline:max A Goal status: ongoing min A with LB dressing 02/18/23  3.  Pt will bathe with supervision/ set up. Baseline: min-mod A for sponge bath Goal status: met for sponge bathing at set up   4.  Pt will perform basic cooking at a w/c level mod I  Revised goal: Pt will perform basic cooking modified independently at a walker level.  Goal status:  met, 02/18/23  pt has performed simple tasks mod I w/c level, goal revised   5.  Pt will demonstrate  improved fine motor coordination as evidenced by performing LUE 9 hole peg test in 2 mins or less.  Revised goal:Pt will demonstrate improved fine motor coordination as evidenced by performing 9 hole peg test in 55 secs or less  Goal status: initial goal met 6/4/246/4/24-  LUE 65 secs , goal revised  6.  Pt will demonstrate improved fine motor coordination as evidenced by performing RUE 9 hole peg test in 1 mins  30 secs or less.  Baseline: RUE  1 min 48 secs, LUE 4 pegs placed in 2 mins  Goal status: met 6/4/246/4/24- RUE 35.46,     7. Pt will perform shower with supervision using tub transfer bench    Goal status: new   8. Pt will perform basic home management at a walker level mod I    Goal status: new ASSESSMENT:  CLINICAL IMPRESSION: Pt is progressing towards goals. Pt is progressing towards goals with improving functional mobility and standing in prep for IADLs.  PERFORMANCE DEFICITS: in functional skills including ADLs, IADLs, coordination, dexterity, sensation, ROM, strength, flexibility, Fine motor control, Gross motor control, mobility, balance, endurance, decreased knowledge of precautions, decreased knowledge of use of DME, and UE functional use, cognitive skills including  and psychosocial skills including coping strategies, environmental adaptation, habits, interpersonal interactions, and routines and behaviors.   IMPAIRMENTS: are limiting patient from ADLs, IADLs, rest and sleep, play, leisure, and social participation.   CO-MORBIDITIES: may have co-morbidities  that affects occupational performance. Patient will benefit from skilled OT to address above impairments and improve overall function.  MODIFICATION OR ASSISTANCE TO COMPLETE EVALUATION: No modification of tasks or assist necessary to complete an evaluation.  OT OCCUPATIONAL PROFILE AND HISTORY: Detailed assessment: Review of records and additional review of physical, cognitive, psychosocial history related to current  functional performance.  CLINICAL DECISION MAKING: LOW - limited treatment options, no task modification necessary  REHAB POTENTIAL: Good  EVALUATION COMPLEXITY: Low    PLAN:  OT FREQUENCY: 2x/week  OT DURATION: 12 weeks  PLANNED INTERVENTIONS: self care/ADL training, therapeutic exercise, therapeutic activity, neuromuscular re-education, manual therapy, passive range of motion, gait training, balance training, stair training, functional mobility training, aquatic therapy, ultrasound, paraffin, moist heat, cryotherapy, patient/family education, energy conservation, coping strategies training, DME and/or AE instructions, and Re-evaluation  RECOMMENDED OTHER SERVICES: PT  CONSULTED AND AGREED WITH PLAN OF CARE: Patient and family member/caregiver  PLAN FOR NEXT SESSION: check short term goals, standing tolerance, transfers, UE strength and coordination, ADL strategies  Tysha Grismore, OT 03/18/2023, 10:40 AM

## 2023-03-18 NOTE — Therapy (Signed)
OUTPATIENT PHYSICAL THERAPY LOWER EXTREMITY TREATMENT   Patient Name: Dana [name] MRN: [id number] DOB:[birth year], 57 y.o., female Today's Date: 03/18/2023  END OF SESSION:  PT End of Session - 03/18/23 0924     Visit Number 14    Date for PT Re-Evaluation 04/15/23    PT Start Time 0925    PT Stop Time 1010    PT Time Calculation (min) 45 min    Equipment Utilized During Treatment Gait belt    Activity Tolerance Patient tolerated treatment well    Behavior During Therapy [facility] for tasks assessed/performed              Past Medical History:  Diagnosis Date   Anxiety    Back pain with radiation    Breast mass, right    Chronic pain    Chronic, continuous use of opioids    Complication of anesthesia 04/07/14   Allergic reaction to Lisinopril immediately following surgery   DJD (degenerative joint disease)    Fibromyalgia    Groin abscess    Headache(784.0)    History of IBS    Hypercholesterolemia    IBS (irritable bowel syndrome)    Lactose intolerance    Mild hypertension    Obesity    Tobacco use disorder    Umbilical hernia    Symptomatic   Past Surgical History:  Procedure Laterality Date   ABDOMINAL HYSTERECTOMY     ANTERIOR CERVICAL DECOMP/DISCECTOMY FUSION N/A 12/20/2015   Procedure: ANTERIOR CERVICAL DECOMPRESSION FUSION CERVICAL 4-5, CERVICAL 5-6, CERVICAL 6-7 WITH INSTRUMENTATION AND ALLOGRAFT;  Surgeon: Estill Bamberg, MD;  Location: [facility] OR;  Service: Orthopedics;  Laterality: N/A;  Anterior cervical decompression fusion, cervical 4-5, cervical 5-6, cervical 6-7 with instrumentation and allograft   BACK SURGERY     BILATERAL SALPINGECTOMY  09/03/2012   Procedure: BILATERAL SALPINGECTOMY;  Surgeon: Ok Edwards, MD;  Location: [facility];  Service: Gynecology;  Laterality: Bilateral;   BREAST BIOPSY Right 04/07/2014   Procedure: REMOVAL RIGHT BREAST MASS WITH WIRE LOCALIZATION;  Surgeon: Adolph Pollack, MD;  Location: [facility] OR;  Service: General;   Laterality: Right;   BREAST EXCISIONAL BIOPSY Left    x2   BREAST LUMPECTOMY     x2   CARPAL TUNNEL RELEASE Left 05/11/2019   Procedure: LEFT CARPAL TUNNEL RELEASE, RIGHT TENNIS ELBOW MARCAINE/DEPO MEDROL INJECTION UNDER ANESTHESIA;  Surgeon: Kerrin Champagne, MD;  Location: [facility] OR;  Service: Orthopedics;  Laterality: Left;   COLONOSCOPY W/ BIOPSIES  04/24/2012   per Dr. Leone Payor, clear, repeat in 10 yrs    disectomy     ESOPHAGOGASTRODUODENOSCOPY     FINGER SURGERY     Right index-excision of mass    FOOT SURGERY Right    Bone Spurs   IR FLUORO GUIDE CV LINE RIGHT  01/29/2023   IR REMOVAL TUN CV CATH W/O FL  02/08/2023   IR US GUIDE VASC ACCESS RIGHT  01/31/2023   LAPAROSCOPIC HYSTERECTOMY  09/03/2012   Procedure: HYSTERECTOMY TOTAL LAPAROSCOPIC;  Surgeon: Ok Edwards, MD;  Location: [facility];  Service: Gynecology;  Laterality: N/A;   LUMBAR DISC SURGERY     TUBAL LIGATION     UMBILICAL HERNIA REPAIR N/A 05/01/2018   Procedure: UMBILICAL HERNIA REPAIR;  Surgeon: Jimmye Norman, MD;  Location: [facility] OR;  Service: General;  Laterality: N/A;   Patient Active Problem List   Diagnosis Date Noted   CIDP (chronic inflammatory demyelinating polyneuropathy) (HCC) 01/28/2023   Gait abnormality 01/28/2023  Glaucoma 01/28/2023   Pain due to onychomycosis of toenails of both feet 01/21/2023   Wheelchair dependence 12/30/2022   Nerve pain 12/30/2022   Insomnia due to medical condition 12/30/2022   Acute inflammatory demyelinating polyneuropathy (HCC) 10/01/2022   UTI (urinary tract infection) 09/24/2022   Hypomagnesemia 09/24/2022   Weakness 09/23/2022   Hypokalemia 09/23/2022   B12 deficiency 09/05/2022   Folate deficiency 09/05/2022   Carpal tunnel syndrome, left upper limb 05/11/2019    Class: Chronic   Spinal stenosis of lumbar region with neurogenic claudication 08/20/2018   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   Primary osteoarthritis of right knee 02/27/2017   Right tennis elbow  11/11/2016   Muscle cramps 01/15/2016   Bilateral leg edema 01/08/2016   Radiculopathy 12/20/2015   Hyperglycemia 12/21/2014   Mastodynia, female 11/30/2014   S/P excision of fibroadenoma of breast 11/30/2014   Angioedema of lips 04/07/2014   Fibroadenoma of right breast 03/07/2014   Pelvic pain in female 06/19/2012   Menorrhagia 06/11/2012   SUI (stress urinary incontinence, female) 06/11/2012   HTN (hypertension) 06/11/2012   IBS (irritable bowel syndrome) - diarrhea predominant 03/17/2012   MICROSCOPIC HEMATURIA 11/30/2010   BREAST PAIN, RIGHT 11/30/2010   BREAST MASS, RIGHT 01/12/2010   HYPERCHOLESTEROLEMIA 12/07/2007   CIGARETTE SMOKER 12/07/2007   DEGENERATIVE JOINT DISEASE 12/07/2007   Low back pain with sciatica 12/07/2007   HEADACHE 12/07/2007   Obesity, Class III, BMI 40-49.9 (morbid obesity) (HCC) 08/03/2007   Anxiety state 08/03/2007    PCP: Nelwyn Salisbury, MD  REFERRING PROVIDER: Nelwyn Salisbury, MD  REFERRING DIAG: G61.0 (ICD-10-CM) - Guillain Barr syndrome [facility])   THERAPY DIAG:  Other lack of coordination  Muscle weakness (generalized)  Other abnormalities of gait and mobility  Unsteadiness on feet  Difficulty in walking, not elsewhere classified  CIDP (chronic inflammatory demyelinating polyneuropathy) (HCC)  Guillain Barr syndrome (HCC)  Rationale for Evaluation and Treatment: Rehabilitation  ONSET DATE: 12/05/22  SUBJECTIVE:   SUBJECTIVE STATEMENT: Patient reports she had an episode of her R knee buckling, but caught herself on the walker and did not fall.  OBJECTIVE STATEMENT: 01/28/23 ASSESSMENT AND PLAN   Dana [name] is a 57 y.o. female   Demyelinating polyradiculoneuropathy             Symptom onset monophasic since September 2024, failed to respond to IVIG in January 2024,             EMG nerve conduction study showed demyelinating features, no evidence of axonal loss,             Talk with neurohospitalist Dr. Iver Nestle, ED  triage, will send her for hospital admission for plasma exchange, please add on following labs, immunofixative protein electrophoresis, ANA with reflex, SSA, SSB titer, iron panel             Starting outpatient IVIG prior authorization process,  LAVAEH CALLO is a 57 y.o. female who presented to the [facility] ED on 09/23/2022 with bilateral lower extremity weakness with decreased mobility that has progressed to both arms. She also reported numbness and tingling of the extremities. She was transferred to [facility] for MRI. 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  Inpatient Rehab F/B HHPT.  R knee injection 10/08/22 trigger point injections for worsening back pain 2/15  RA  PAIN:  Are you having pain? Yes: NPRS scale: up to 10/10 Pain location: all over, but her R knee pain is worst,  Pain description: Lambert Mody  Aggravating factors: Has difficulty standing up Relieving factors: Medicine helps somewhat  PRECAUTIONS: Fall  WEIGHT BEARING RESTRICTIONS: No  FALLS:  Has patient fallen in last 6 months? No  LIVING ENVIRONMENT: Lives with: lives with their family Lives in: House/apartment Stairs: Nohas a ramp Has following equipment at home: Environmental consultant - 2 wheeled, Wheelchair (power), Tour manager, bed side commode, Ramped entry, and [employer] lift  OCCUPATION: on disability due to RA and a fall several years ago  PLOF: Independent  PATIENT GOALS: walk  NEXT MD VISIT: She is scheduled to follow up with Dr. Serita Sheller with Physical Med/Rehab on 12-30-22. She is scheduled to follow up with Dr. Levert Feinstein at Neurology on 01-28-23.   OBJECTIVE:   DIAGNOSTIC FINDINGS:  Right knee radiographs 11/09/2020   FINDINGS: Severe patellofemoral joint space narrowing. Severe superior and mild inferior patellar degenerative osteophytosis. Moderate superior trochlear degenerative osteophytosis. No joint effusion. Severe medial compartment joint space narrowing with moderate peripheral medial  and lateral compartment degenerative osteophytes.  COGNITION: Overall cognitive status: Within functional limits for tasks assessed     SENSATION: Light touch: Impaired  and B hands and feet and lower legs  EDEMA:  BLE edema noted.  MUSCLE LENGTH: Hamstrings: WFL Thomas test: B tightness, to neutral  POSTURE: rounded shoulders and flexed trunk   PALPATION: No TTP  LOWER EXTREMITY ROM: BLE ROM limited in all planes an all joints due to body habitus   LOWER EXTREMITY MMT:  MMT Right eval Left eval  Hip flexion 3 3-  Hip extension 3- 3-  Hip abduction 3 3-  Hip adduction    Hip internal rotation    Hip external rotation    Knee flexion 4 4-  Knee extension 4- 4-  Ankle dorsiflexion 4- 4-  Ankle plantarflexion    Ankle inversion 4- 4-  Ankle eversion 4- 4-   (Blank rows = not tested)  FUNCTIONAL TESTS:  5 times sit to stand: unable to complete Timed up and go (TUG): unable to complete  Bed Mobility: Occasional min A for sup <> sit, rolled B on mat with great effort, light min A  TRANSFERS: Scooting transfer with CGA, stand pivot with CGA, she reports that she sometimes has difficulty rising due to her knee pain  GAIT: Distance walked: 0' Assistive device utilized:  shopping cart-will bring her RW from home. Level of assistance: Min A Comments: Stood and weight shifted, then stood again and took steps to turn to W/C.  TODAY'S TREATMENT:                                                                                                                              DATE:  03/18/23 Seated long kicks with 3# weights, 2 x 10 B. Supine with legs over physioball, bridge x 10 Attempted bridge with hip IR, but she developed a severe L HS cramp.  NuStep L5 x 2 minutes, then 2 x 30 sec fast pedal.  Tried to increase resistance, but too painful in R knee. Seated rotational weight shifts, sliding hands out to the side while lifting opposite leg to surface of mat. 3 x 5 reps to  each side. Improved stability noted.  03/13/23 Attempted to pedal bike, but it hurt her R knee NuStep-L5 x 4 min, then 3 x 30 sec fast pedal at L3 Ambulation with RW, CGA 1 x 20', then 1 x 40' including turn around. Standing stepping out and back in all directions, alternating legs, with BUE support on RW. Increased speed and efficiency with repetition. Standing step forward to pick up an object, then back and turn to place it on mat behind her. 4 reps each side. Difficulty performing in L stance without BUE support, but able to perform x 3 with UUE support in RLE SLS.  03/04/23 Supine LE and trunk stabilization with BLE over physioball-bridge, bridge with B hip IR, SLR off the ball while stabilizing with opposite leg, 10 each, did have some cramping in R hip adductors Supine rolling to L side lie with R arm and leg held in the air for trunk stability, 3 x each direction. Supine to side lie to sit from r and from L. Standing alternating taps on 4" step with BUE support on RW, CGA, 10 reps each Ambulation with RW, 1 x 25', including 180 degree turn. CGA Standing weight shifts with straight cane and CGA, able to shift and lift heels alternately.  02/27/23 NuStep L5 x 4 minutes, L ant/lat knee pain Fitter press blue and black 2 x 10 each direction Ambulated x 20' with RW and CGA Seated rotational slides- moving hands out to the side and back, 2 x 5 to each side. Added Leg extension to second set. Alternating step taps on 2" step with BUE support on RW B side stepping in front of mat with RW, CGA, 2 x 5 steps to each side. Ambulated x 20' with RW and CGA  PATIENT EDUCATION:  Education details: POC Person educated: Patient Education method: Explanation Education comprehension: verbalized understanding  HOME EXERCISE PROGRAM: [id number]  ASSESSMENT:  CLINICAL IMPRESSION: Patient reports no big changes. Continued to emphasize strength and coordination. R knee pain impedes some activities,  but she compensates well for safety.  OBJECTIVE IMPAIRMENTS: Abnormal gait, decreased activity tolerance, decreased balance, decreased coordination, decreased endurance, difficulty walking, decreased ROM, decreased strength, and postural dysfunction.   ACTIVITY LIMITATIONS: carrying, lifting, bending, standing, squatting, stairs, transfers, and locomotion level  PARTICIPATION LIMITATIONS: meal prep, cleaning, laundry, driving, and shopping  PERSONAL FACTORS: Fitness, Past/current experiences, and 1 comorbidity: Recently diagnosed with GBS  are also affecting patient's functional outcome.   REHAB POTENTIAL: Good  CLINICAL DECISION MAKING: Evolving/moderate complexity  EVALUATION COMPLEXITY: Moderate   GOALS: Goals reviewed with patient? Yes  SHORT TERM GOALS: Target date: 11/28/22 I with initial HEP Baseline: Goal status: met 01/09/23  LONG TERM GOALS: Target date: 03/06/23  I with final HEP Baseline:  Goal status: 03/18/23, ongoing  2.  Decrease 5x STS to < 14 sec Baseline: unable to complete Goal status: 02/18/23-39 sec-ongoing  3.  Decrease TUG to < 20 sec Baseline: Unable to complete. Goal status: 02/18/23 1:55 Ongoing  4.  Patient will ambulate at least 300' with LRAD, MI, on level and unlevel surfaces. Baseline: Stand pivot transfer with CGA Goal status: 02/18/23-20', RW, CGA, slow, but stable throughout. Ongoing  5.  Perform all bed mobility with I Baseline: Occ min A on mat Goal status: 02/18/23-met  6.  Increase BLE strength to at least 4/5 throughout. Baseline: (3-)-3/5 Goal status: 02/18/23-RLE 4-/5, LLE 3+/5, Ongoing  7. Patient and husband will safely perform car transfers with LRAD Baseline: Currently uses van transportation Goal Status: 02/25/23 met  PLAN:  PT FREQUENCY: 1-2x/week  PT DURATION: 12 weeks  PLANNED INTERVENTIONS: Therapeutic exercises, Therapeutic activity, Neuromuscular re-education, Balance training, Gait training, Patient/Family education,  Self Care, Joint mobilization, Stair training, Dry Needling, Electrical stimulation, Cryotherapy, Moist heat, Ultrasound, Ionotophoresis 4mg /ml Dexamethasone, and Manual therapy  PLAN FOR NEXT SESSION: Initiate HEP  Oley Balm DPT

## 2023-03-18 NOTE — Progress Notes (Addendum)
Chief Complaint  Patient presents with   Follow-up    Rm14, alone:CIDP (chronic inflammatory demyelinating polyneuropathy): discuss treatment options      ASSESSMENT AND PLAN  Dana [name] is a 57 y.o. female   Demyelinating polyradiculoneuropathy  Symptom onset monophasic since September 2024,  mild respond to IVIG in January 2024, remain unambulatory  EMG nerve conduction study showed demyelinating features, no evidence of axonal loss,  Reported mild to moderate improvement following plasma exchange May 16 to 24, 2024, but still have residual weakness, gait abnormality, areflexia, length-dependent sensory changes,  Starting outpatient IVIG prior authorization process,  Continue at home rehabilitation,  DIAGNOSTIC DATA (LABS, IMAGING, TESTING) - I reviewed patient records, labs, notes, testing and imaging myself where available. CSF in Jan 2024, TP 155, Glucose 70,  RBC 17, 750  MEDICAL HISTORY:  Dana [name], is a 57 year old female, accompanied by her husband seen in request by her primary care from [facility] Dr. 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 PMHX Chronic insomnia HTN Neuropathic pain--gabapentin 900mg  tid B12 deficiency. Obesity Anxiety  Patient had long history of chronic joint pain, lumbar, cervical decompression surgery in the past, obesity, went on disability few years ago, poor functional status, but at her baseline around September 2023, she was able to ambulate with a cane, independent in daily activity, has a lot of knee pain.  Around October 2023, she began to notice numbness tingling starting at the bottom of her feet, quickly ascending to involving both feet, lower extremity, by November 2023, she began to notice increased difficulty walking, difficulty getting into her bed, began to notice fingertip paresthesia,  weakness of her hands,  In December 2023, she had such difficulty, could not put Her Christmas tree, by end of December, she began to have frequent GI symptoms, nausea vomiting, throwing up, numbness continue ascending to lower pelvic region, she cannot feel herself when wiping, difficulty initiate urine,  Presented to hospital on September 23, 2022, with worsening weakness, numbness, fell, could not get up  She was diagnosed with possible Guillain-Barr, based on abnormal spinal fluid testing, bloody tap, total protein was 155,  She was treated with IVIG 2 g/kg total divided into 5 days, January 10 to 15, 2024  She denies significant improvement in the setting of very poor baseline, profound weakness when receiving treatment, was discharged to inpatient rehabilitation, now back home, she is bound to electronic wheelchair, no longer ambulatory, needing help transfer, significant bilateral knee pain, continue have numbness tingling to lower pelvic region, also have hands numbness tingling, upper extremity weakness  Personally reviewed MRI all cervical spine, previous ACDF at C4-7 without residual spinal canal stenosis, multilevel degenerative changes, MRI of thoracic spine showed no significant abnormality,  MRI of the lumbar spine showed multilevel degenerative changes,moderate spinal stenosis at L3-4, epidural lipomatosis with resultant moderate to severe spinal stenosis at L4-5,   CT head showed no significant abnormality   Extensive laboratory evaluation since December 2023, mild elevation of A1c 6.1, B12 was 150, repeat level was 1069, negative HIV, hepatitis A, CMV, respiratory panel, TSH, folic acid, elevated ESR of 38, C-reactive protein of 5.9, normal aldolase, copper, vitamin D slightly decreased 6.8, ant-GM1, GQ antibody,  Drug screen was positive for amrijuana  EMG nerve conduction study showed absent sensory responses of upper and lower extremities, prolonged F-wave latency at upper  extremity motor response,  no evidence of axonal loss  UPDATE March 18 2023: She was sent to hospital for PLEX May 16-24, 2024, she began to notice improvement after plasma exchange,  she continued to progress at home, at home, she can take few steps with walker, but far from baseline  Continue complains of bilateral lower extremity weakness left worse than right, numbness of her feet and hands, clumsy of hands   PHYSICAL EXAM:   Vitals:   03/18/23 1213  BP: 109/77  Pulse: 81  Resp: 17  Weight: 279 lb (126.6 kg)  Height: 5\' 4"  (1.626 m)    Body mass index is 47.89 kg/m.  PHYSICAL EXAMNIATION:  Gen: NAD, conversant, well nourised, well groomed                     Cardiovascular: Regular rate rhythm, no peripheral edema, warm, nontender. Eyes: Conjunctivae clear without exudates or hemorrhage Neck: Supple, no carotid bruits. Pulmonary: Clear to auscultation bilaterally   NEUROLOGICAL EXAM:  MENTAL STATUS: Speech/cognition: Obese,in electronic wheelchair, awake, alert, oriented to history taking and casual conversation CRANIAL NERVES: CN II: Visual fields are full to confrontation. Pupils are round equal and briskly reactive to light. CN III, IV, VI: extraocular movement are normal. No ptosis. CN V: Facial sensation is intact to light touch CN VII: Face is symmetric with normal eye closure  CN VIII: Hearing is normal to causal conversation. CN IX, X: Phonation is normal. CN XI: Head turning and shoulder shrug are intact  MOTOR: Mild bilateral upper extremity proximal and distal weakness, Bilateral hip flexion 4, knee flexion, knee extension 5, ankle dorsi plantarflexion 5,  REFLEXES: Areflexia  SENSORY: Decreased vibratory sensation to knee level, to elbow level at upper extremity, Length-dependent decreased pinprick to knee level, well-preserved right upper extremity  COORDINATION: There is no trunk or limb dysmetria noted.  GAIT/STANCE: Deferred  REVIEW OF SYSTEMS:   Full 14 system review of systems performed and notable only for as above All other review of systems were negative.   ALLERGIES: Allergies  Allergen Reactions   Zestril [Lisinopril] Anaphylaxis   Codeine Hives   Cyanocobalamin [Vitamin B12] Itching    Patient able to take B12 injections with Benadryl.  Has no issues when taking B12 tablets.    Hydrocodone Hives   Penicillins Itching and Swelling   Norvasc [Amlodipine] Swelling   Tylenol [Acetaminophen] Rash    HOME MEDICATIONS: Current Outpatient Medications  Medication Sig Dispense Refill   ALPRAZolam (XANAX) 0.5 MG tablet Take 1 tablet (0.5 mg total) by mouth 2 (two) times daily as needed for anxiety. 180 tablet 1   Cyanocobalamin (VITAMIN B-12 PO) Take 1 tablet by mouth daily.     cyclobenzaprine (FLEXERIL) 10 MG tablet Take 1 tablet (10 mg total) by mouth 3 (three) times daily. (Patient taking differently: Take 10 mg by mouth 3 (three) times daily as needed for muscle spasms.) 90 tablet 5   dorzolamide-timolol (COSOPT) 2-0.5 % ophthalmic solution Place 1 drop into both eyes 2 (two) times daily. 10 mL 1   famotidine (PEPCID) 20 MG tablet Take 20 mg by mouth 2 (two) times daily.     fluticasone (FLONASE) 50 MCG/ACT nasal spray SPRAY 2 SPRAYS INTO EACH NOSTRIL EVERY DAY 16 mL 11   folic acid (FOLVITE) 1 MG tablet Take 1 tablet (1 mg total) by mouth daily. 90 tablet 3   furosemide (LASIX) 40 MG tablet Take 1 tablet (40 mg total) by mouth 2 (two) times daily. 180 tablet  3   gabapentin (NEURONTIN) 300 MG capsule Take 3 capsules (900 mg total) by mouth 3 (three) times daily. 270 capsule 5   hydrocerin (EUCERIN) CREA Apply 1 Application topically 2 (two) times daily.  0   ibuprofen (ADVIL) 200 MG tablet Take 400 mg by mouth 2 (two) times daily as needed for headache or moderate pain.     latanoprost (XALATAN) 0.005 % ophthalmic solution Place 1 drop into both eyes at bedtime. 2.5 mL 1   magnesium oxide (MAG-OX) 400 MG tablet Take 1  tablet (400 mg total) by mouth at bedtime. (Patient taking differently: Take 200 mg by mouth at bedtime.) 90 tablet 3   metoprolol tartrate (LOPRESSOR) 50 MG tablet Take 1 tablet (50 mg total) by mouth 2 (two) times daily. 180 tablet 3   morphine (MS CONTIN) 60 MG 12 hr tablet Take 1 tablet (60 mg total) by mouth every 12 (twelve) hours as needed for pain. 60 tablet 0   Multiple Vitamin (MULTIVITAMIN WITH MINERALS) TABS tablet Take 1 tablet by mouth daily. 30 tablet 0   pantoprazole (PROTONIX) 40 MG tablet Take 1 tablet (40 mg total) by mouth daily. 90 tablet 3   potassium chloride (KLOR-CON) 10 MEQ tablet Take 10 mEq by mouth 2 (two) times daily.     promethazine (PHENERGAN) 25 MG tablet Take 25 mg by mouth every 6 (six) hours as needed for nausea or vomiting.     senna-docusate (SENOKOT-S) 8.6-50 MG tablet Take 1 tablet by mouth 2 (two) times daily. (Patient taking differently: Take 1 tablet by mouth 2 (two) times daily as needed for moderate constipation.)     traZODone (DESYREL) 50 MG tablet TAKE 1-2 TABLETS BY MOUTH AT BEDTIME AS NEEDED FOR SLEEP. (Patient taking differently: Take 50 mg by mouth at bedtime as needed for sleep.) 180 tablet 1   No current facility-administered medications for this visit.    PAST MEDICAL HISTORY: Past Medical History:  Diagnosis Date   Anxiety    Back pain with radiation    Breast mass, right    Chronic pain    Chronic, continuous use of opioids    Complication of anesthesia 04/07/14   Allergic reaction to Lisinopril immediately following surgery   DJD (degenerative joint disease)    Fibromyalgia    Groin abscess    Headache(784.0)    History of IBS    Hypercholesterolemia    IBS (irritable bowel syndrome)    Lactose intolerance    Mild hypertension    Obesity    Tobacco use disorder    Umbilical hernia    Symptomatic    PAST SURGICAL HISTORY: Past Surgical History:  Procedure Laterality Date   ABDOMINAL HYSTERECTOMY     ANTERIOR CERVICAL  DECOMP/DISCECTOMY FUSION N/A 12/20/2015   Procedure: ANTERIOR CERVICAL DECOMPRESSION FUSION CERVICAL 4-5, CERVICAL 5-6, CERVICAL 6-7 WITH INSTRUMENTATION AND ALLOGRAFT;  Surgeon: Estill Bamberg, MD;  Location: [facility] OR;  Service: Orthopedics;  Laterality: N/A;  Anterior cervical decompression fusion, cervical 4-5, cervical 5-6, cervical 6-7 with instrumentation and allograft   BACK SURGERY     BILATERAL SALPINGECTOMY  09/03/2012   Procedure: BILATERAL SALPINGECTOMY;  Surgeon: Ok Edwards, MD;  Location: [facility] ORS;  Service: Gynecology;  Laterality: Bilateral;   BREAST BIOPSY Right 04/07/2014   Procedure: REMOVAL RIGHT BREAST MASS WITH WIRE LOCALIZATION;  Surgeon: Adolph Pollack, MD;  Location[facility];  Service: General;  Laterality: Right;   BREAST EXCISIONAL BIOPSY Left    x2   BREAST  LUMPECTOMY     x2   CARPAL TUNNEL RELEASE Left 05/11/2019   Procedure: LEFT CARPAL TUNNEL RELEASE, RIGHT TENNIS ELBOW MARCAINE/DEPO MEDROL INJECTION UNDER ANESTHESIA;  Surgeon: Kerrin Champagne, MD;  Location: [facility] OR;  Service: Orthopedics;  Laterality: Left;   COLONOSCOPY W/ BIOPSIES  04/24/2012   per Dr. Leone Payor, clear, repeat in 10 yrs    disectomy     ESOPHAGOGASTRODUODENOSCOPY     FINGER SURGERY     Right index-excision of mass    FOOT SURGERY Right    Bone Spurs   IR FLUORO GUIDE CV LINE RIGHT  01/29/2023   IR REMOVAL TUN CV CATH W/O FL  02/08/2023   IR US GUIDE VASC ACCESS RIGHT  01/31/2023   LAPAROSCOPIC HYSTERECTOMY  09/03/2012   Procedure: HYSTERECTOMY TOTAL LAPAROSCOPIC;  Surgeon: Ok Edwards, MD;  Location: [facility];  Service: Gynecology;  Laterality: N/A;   LUMBAR DISC SURGERY     TUBAL LIGATION     UMBILICAL HERNIA REPAIR N/A 05/01/2018   Procedure: UMBILICAL HERNIA REPAIR;  Surgeon: Jimmye Norman, MD;  Location: [facility] OR;  Service: General;  Laterality: N/A;    FAMILY HISTORY: Family History  Problem Relation Age of Onset   Diabetes Mother    Diabetes Father    Breast cancer Maternal Aunt 46    Prostate cancer Paternal Grandfather     SOCIAL HISTORY: Social History   Socioeconomic History   Marital status: Married    Spouse name: Hollice Maffucci   Number of children: 4   Years of education: Not on file   Highest education level: Not on file  Occupational History   Occupation: Psychologist, educational  Tobacco Use   Smoking status: Former    Years: 33    Types: Cigarettes   Smokeless tobacco: Never   Tobacco comments:    quit March 2017  Vaping Use   Vaping Use: Never used  Substance and Sexual Activity   Alcohol use: Not Currently   Drug use: Not Currently    Comment: chronic use of dilaudid   Sexual activity: Not Currently    Birth control/protection: Surgical  Other Topics Concern   Not on file  Social History Narrative   Married - lives with husband 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, fashion/clothing    4 grown children does not live with her - 1985, 1988, 75 (twins)Grandchildren - 4Updated 10/12/2013   Right handed   Caffeine-none   Social Determinants of Health   Financial Resource Strain: Low Risk  (05/06/2022)   Overall Financial Resource Strain (CARDIA)    Difficulty of Paying Living Expenses: Not hard at all  Food Insecurity: No Food Insecurity (01/28/2023)   Hunger Vital Sign    Worried About Running Out of Food in the Last Year: Never true    Ran Out of Food in the Last Year: Never true  Transportation Needs: No Transportation Needs (01/28/2023)   PRAPARE - Administrator, Civil Service (Medical): No    Lack of Transportation (Non-Medical): No  Physical Activity: Inactive (05/06/2022)   Exercise Vital Sign    Days of Exercise per Week: 0 days    Minutes of Exercise per Session: 0 min  Stress: No Stress Concern Present (05/06/2022)   [employer] of Occupational Health - Occupational Stress Questionnaire    Feeling of Stress : Not at all  Social Connections: Not on file  Intimate Partner Violence: Not At Risk (01/28/2023)   Humiliation, Afraid, Rape,  and Kick questionnaire  Fear of Current or Ex-Partner: No    Emotionally Abused: No    Physically Abused: No    Sexually Abused: No      Levert Feinstein, M.D. Ph.D.  [facility] [address], [address] [place], [place] [address] Ph: [phone] Fax: [phone]  CC:  Nelwyn Salisbury, MD [address] [place],  [place] [address]  Nelwyn Salisbury, MD

## 2023-03-19 NOTE — Telephone Encounter (Signed)
Patient Advocate Encounter  Prior Authorization for Morphine Sulfate ER 60MG  er tablets has been approved through [employer].    Key: [id number]  Effective: 03-11-2023 to 09-09-2023

## 2023-03-21 NOTE — Telephone Encounter (Signed)
FYI Pt is already taking Morphine MS Contin

## 2023-03-24 ENCOUNTER — Encounter: Payer: 59 | Attending: Physical Medicine and Rehabilitation | Admitting: Physical Medicine and Rehabilitation

## 2023-03-24 DIAGNOSIS — G4701 Insomnia due to medical condition: Secondary | ICD-10-CM | POA: Insufficient documentation

## 2023-03-24 DIAGNOSIS — M792 Neuralgia and neuritis, unspecified: Secondary | ICD-10-CM | POA: Insufficient documentation

## 2023-03-24 DIAGNOSIS — G610 Guillain-Barre syndrome: Secondary | ICD-10-CM | POA: Insufficient documentation

## 2023-03-24 DIAGNOSIS — Z993 Dependence on wheelchair: Secondary | ICD-10-CM | POA: Insufficient documentation

## 2023-03-25 ENCOUNTER — Ambulatory Visit: Payer: 59 | Admitting: Physical Therapy

## 2023-03-25 ENCOUNTER — Encounter: Payer: Self-pay | Admitting: Occupational Therapy

## 2023-03-25 ENCOUNTER — Ambulatory Visit: Payer: 59 | Admitting: Occupational Therapy

## 2023-03-25 ENCOUNTER — Encounter: Payer: Self-pay | Admitting: Physical Therapy

## 2023-03-25 ENCOUNTER — Telehealth: Payer: Self-pay | Admitting: Neurology

## 2023-03-25 DIAGNOSIS — G6181 Chronic inflammatory demyelinating polyneuritis: Secondary | ICD-10-CM

## 2023-03-25 DIAGNOSIS — R278 Other lack of coordination: Secondary | ICD-10-CM | POA: Diagnosis not present

## 2023-03-25 DIAGNOSIS — R2689 Other abnormalities of gait and mobility: Secondary | ICD-10-CM

## 2023-03-25 DIAGNOSIS — M6281 Muscle weakness (generalized): Secondary | ICD-10-CM

## 2023-03-25 DIAGNOSIS — G610 Guillain-Barre syndrome: Secondary | ICD-10-CM

## 2023-03-25 DIAGNOSIS — R269 Unspecified abnormalities of gait and mobility: Secondary | ICD-10-CM

## 2023-03-25 DIAGNOSIS — R2681 Unsteadiness on feet: Secondary | ICD-10-CM

## 2023-03-25 DIAGNOSIS — R262 Difficulty in walking, not elsewhere classified: Secondary | ICD-10-CM

## 2023-03-25 NOTE — Therapy (Signed)
OUTPATIENT OCCUPATIONAL THERAPY NEURO Treatment  Patient Name: Dana [name] MRN: [id number] DOB:September 14, 1966, 57 y.o., female Today's Date: 03/25/2023  PCP: Dr. Clent Ridges REFERRING PROVIDER: Dr. Gershon Crane  END OF SESSION:  OT End of Session - 03/25/23 0819     Visit Number 16    Number of Visits 35    Date for OT Re-Evaluation 05/13/23    Authorization Type UHC, Medicare    Authorization - Visit Number 16    Progress Note Due on Visit 20    OT Start Time [phone]    OT Stop Time 0844    OT Time Calculation (min) 33 min    Activity Tolerance Patient tolerated treatment well    Behavior During Therapy [facility] for tasks assessed/performed                   Past Medical History:  Diagnosis Date   Anxiety    Back pain with radiation    Breast mass, right    Chronic pain    Chronic, continuous use of opioids    Complication of anesthesia 04/07/14   Allergic reaction to Lisinopril immediately following surgery   DJD (degenerative joint disease)    Fibromyalgia    Groin abscess    Headache(784.0)    History of IBS    Hypercholesterolemia    IBS (irritable bowel syndrome)    Lactose intolerance    Mild hypertension    Obesity    Tobacco use disorder    Umbilical hernia    Symptomatic   Past Surgical History:  Procedure Laterality Date   ABDOMINAL HYSTERECTOMY     ANTERIOR CERVICAL DECOMP/DISCECTOMY FUSION N/A 12/20/2015   Procedure: ANTERIOR CERVICAL DECOMPRESSION FUSION CERVICAL 4-5, CERVICAL 5-6, CERVICAL 6-7 WITH INSTRUMENTATION AND ALLOGRAFT;  Surgeon: Estill Bamberg, MD;  Location: [facility] OR;  Service: Orthopedics;  Laterality: N/A;  Anterior cervical decompression fusion, cervical 4-5, cervical 5-6, cervical 6-7 with instrumentation and allograft   BACK SURGERY     BILATERAL SALPINGECTOMY  09/03/2012   Procedure: BILATERAL SALPINGECTOMY;  Surgeon: Ok Edwards, MD;  Location: [facility] ORS;  Service: Gynecology;  Laterality: Bilateral;   BREAST BIOPSY Right 04/07/2014    Procedure: REMOVAL RIGHT BREAST MASS WITH WIRE LOCALIZATION;  Surgeon: Adolph Pollack, MD;  Location: [facility] OR;  Service: General;  Laterality: Right;   BREAST EXCISIONAL BIOPSY Left    x2   BREAST LUMPECTOMY     x2   CARPAL TUNNEL RELEASE Left 05/11/2019   Procedure: LEFT CARPAL TUNNEL RELEASE, RIGHT TENNIS ELBOW MARCAINE/DEPO MEDROL INJECTION UNDER ANESTHESIA;  Surgeon: Kerrin Champagne, MD;  Location: [facility] OR;  Service: Orthopedics;  Laterality: Left;   COLONOSCOPY W/ BIOPSIES  04/24/2012   per Dr. Leone Payor, clear, repeat in 10 yrs    disectomy     ESOPHAGOGASTRODUODENOSCOPY     FINGER SURGERY     Right index-excision of mass    FOOT SURGERY Right    Bone Spurs   IR FLUORO GUIDE CV LINE RIGHT  01/29/2023   IR REMOVAL TUN CV CATH W/O FL  02/08/2023   IR US GUIDE VASC ACCESS RIGHT  01/31/2023   LAPAROSCOPIC HYSTERECTOMY  09/03/2012   Procedure: HYSTERECTOMY TOTAL LAPAROSCOPIC;  Surgeon: Ok Edwards, MD;  Location: [facility] ORS;  Service: Gynecology;  Laterality: N/A;   LUMBAR DISC SURGERY     TUBAL LIGATION     UMBILICAL HERNIA REPAIR N/A 05/01/2018   Procedure: UMBILICAL HERNIA REPAIR;  Surgeon: Jimmye Norman, MD;  Location: [facility]  OR;  Service: General;  Laterality: N/A;   Patient Active Problem List   Diagnosis Date Noted   CIDP (chronic inflammatory demyelinating polyneuropathy) (HCC) 01/28/2023   Gait abnormality 01/28/2023   Glaucoma 01/28/2023   Pain due to onychomycosis of toenails of both feet 01/21/2023   Wheelchair dependence 12/30/2022   Nerve pain 12/30/2022   Insomnia due to medical condition 12/30/2022   Acute inflammatory demyelinating polyneuropathy (HCC) 10/01/2022   UTI (urinary tract infection) 09/24/2022   Hypomagnesemia 09/24/2022   Weakness 09/23/2022   Hypokalemia 09/23/2022   B12 deficiency 09/05/2022   Folate deficiency 09/05/2022   Carpal tunnel syndrome, left upper limb 05/11/2019    Class: Chronic   Spinal stenosis of lumbar region with neurogenic claudication  08/20/2018   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   Primary osteoarthritis of right knee 02/27/2017   Right tennis elbow 11/11/2016   Muscle cramps 01/15/2016   Bilateral leg edema 01/08/2016   Radiculopathy 12/20/2015   Hyperglycemia 12/21/2014   Mastodynia, female 11/30/2014   S/P excision of fibroadenoma of breast 11/30/2014   Angioedema of lips 04/07/2014   Fibroadenoma of right breast 03/07/2014   Pelvic pain in female 06/19/2012   Menorrhagia 06/11/2012   SUI (stress urinary incontinence, female) 06/11/2012   HTN (hypertension) 06/11/2012   IBS (irritable bowel syndrome) - diarrhea predominant 03/17/2012   MICROSCOPIC HEMATURIA 11/30/2010   BREAST PAIN, RIGHT 11/30/2010   BREAST MASS, RIGHT 01/12/2010   HYPERCHOLESTEROLEMIA 12/07/2007   CIGARETTE SMOKER 12/07/2007   DEGENERATIVE JOINT DISEASE 12/07/2007   Low back pain with sciatica 12/07/2007   HEADACHE 12/07/2007   Obesity, Class III, BMI 40-49.9 (morbid obesity) (HCC) 08/03/2007   Anxiety state 08/03/2007    ONSET DATE: 12/05/22- referral date  REFERRING DIAG: G61.0 (ICD-10-CM) - Guillain Barr syndrome (HCC)  THERAPY DIAG:    Rationale for Evaluation and Treatment: Rehabilitation  SUBJECTIVE:   SUBJECTIVE STATEMENT: Pt reports frustration over her situation PERTINENT HISTORY: Pt is a 57 y/o female hospitalized 09/23/22  with progressive weakness and sensation changes, consistent with Guillain-Barr syndrome. MRI negative. LP results and assessment most consistent with Guillain-Barr syndrome.  Pt was d/c home from CIR 11/06/22 PMH includes: glaucoma anxiety, DJD, fibromyalgia, HTN, tobacco use, B-12 deficiency, OA, ACDF 2017.  Pt was re-hospitalized 01/28/2456 with CIDP (chronic inflammatory demyelinating polyneuropathy). Saw Neuro on day of admission, NCS showed demyelination, referred to hospital for PLEX. Initially diagnosed with an acute demyelinating neuropathy Jan 2024, treated with IVIG. PMH: Spinal  stenosis, HTN, Obesity   PRECAUTIONS: Fall  WEIGHT BEARING RESTRICTIONS: no  PAIN:  Are you having pain? Yes:  NPRS scale: mild pain Pain location: R knee Pain description: aching Aggravating factors: standing Relieving factors: rest    FALLS: Has patient fallen in last 6 months? No  LIVING ENVIRONMENT: Lives with: lives with their spouse Lives in: House/apartment Stairs: has ramp built Has following equipment at home:   BSC, steadi,  tub bench but can't access bathroom   PLOF: Independent  PATIENT GOALS: increase independence   OBJECTIVE:   HAND DOMINANCE: Right  ADLs: from initial eval Overall ADLs: assistance required, unable to access BR Transfers/ambulation related to ADLs: Eating: unable to cut food Grooming: set up UB Dressing: set up for bra and shirt LB Dressing: max A Toileting: uses BSC uses steadi Bathing: min-mod A in hospital  bed  Tub Shower transfers: n/a Equipment: Emergency planning/management officer  IADLs:dependent for IADLS   MOBILITY STATUS:  uses w/c  , per PT report minguard  for transfer from w/c to mat    ACTIVITY TOLERANCE: Activity tolerance: Pt fatigues quickly    UPPER EXTREMITY ROM:  see updates for 02/18/23 re-eval   Active ROM Right 02/18/23 Left 02/18/23  Shoulder flexion 130 120  Shoulder abduction 90 90  Shoulder adduction    Shoulder extension    Shoulder internal rotation    Shoulder external rotation    Elbow flexion    Elbow extension  -5  Wrist flexion    Wrist extension    Wrist ulnar deviation    Wrist radial deviation    Wrist pronation    Wrist supination    (Blank rows = not tested)  UPPER EXTREMITY MMT:   see below for re-eval 02/18/23  MMT Right 02/18/23 Left 02/18/23  Shoulder flexion 3+/5 3+/5  Shoulder abduction    Shoulder adduction    Shoulder extension    Shoulder internal rotation    Shoulder external rotation    Middle trapezius    Lower trapezius    Elbow flexion 4/5 4/5  Elbow extension 4-/5 4-/5   Wrist flexion    Wrist extension    Wrist ulnar deviation    Wrist radial deviation    Wrist pronation    Wrist supination      HAND FUNCTION: Grip strength: Right: 32 lbs; Left: 18 lbs, 02/18/23 R 55 lbs, L 35 lbs  COORDINATION: 9 Hole Peg test:initial eval  Right: 1 min 48 sec; Left: 4 pegs in 2 mins  Box and Blocks:  Right 35 blocks, Left  26 blocks 02/18/23- RUE 35.46, LUE 65 secs  SENSATION: Light touch: WFL Hot/Cold: Not tested    COGNITION: Overall cognitive status:NT   VISION: Subjective report: reports hx of glaucoma VISION ASSESSMENT: Not tested  OBSERVATIONS: Pt is eager to improve, pt's husband is very supportive   TODAY'S TREATMENT:                                                                                                                              DATE: 03/25/23 Pt is hoarse today and reports a tickling in her throat as well as LE swelling. Therapist recommends pt calls MD.Pt arrived late as her transportation did not pick her up. Arm bike x 6 mins level 1 for conditioning.Pt transferred to and from arm bike to w/c with min A. Copying small peg design for increased fine motor coordination with LUE, min difficulty and increased time, removing with in hand manipulation. Pt demonstrates improved fine motor coordination.  03/18/23- Pt amb across gym with RW and minguard to arm bike.  Arm bike x 7 mins level 1  for conditioning Placing small pegs into pegboard with LUE for increased fine motor coordination, pt with improved performance and coordination today. Pt walked 3 ft from chair to counter and stood to place and remove items from overhead shelves with 1 seated rest break. Discussion with patient regarding her home situation,. Pt reports frustration  and she feels like she is a burden on husband. Therapist recommends pt seeks counseling. Shoulder abduction  with red band biceps curls with green band  20 reps for UE strengthening   03/13/23-Pt reports she had  a stomach bug and she wasn't feeling well. Placing large pegs into pegboard with LUE with occasional min difficulty, then removing with 3 pt pinch min v.c to avoid shoulder hike. Red theraband exercises for shoulder abduction, rowing and extension 15 reps each,  green band for biceps curls and triceps extension 15 reps each, min v.c Arm bike x 6 mins level 3 for conditioning, pt transfered to and from with min A Discussed safety for cooking and recommendation that if pt decides to fry fish, she performs with someone for safety, and she keeps her LUE away from hot surfaces due to sensory deficits. Pt verbalized understanding.   03/04/23 Red theraband exercises for shoulder abduction, rowing and extension 20 reps each, upgraded to green band for biceps curls and triceps extension 20 reps each, min v.c Arm bike x 8 mins, 4 mins each direction, level 2 for conditioning, pt transferred to and from seat with supervision. Coordination activities with LUE: flipping and dealing cards, min difficulty/ v.c  02/27/23-Arm bike x 6 mins level 2 for conditioning (Pt transfers to and from w/c with supervision) Placing large pegs into pegboard with LUE then removing for increased fine motor coordination, min difficulty/ v.c  Standing at countertop with supervision to place and remove items with right then left UE's, pt stood x 3 for 30 secs to 1 min 15 secs, rest breaks in between Gripper set at level 1 to pick up 1 inch blocks  with right and left UE's for sustained grip, min difficulty Pt reports amb to BR with walker without assist, therapist recommends supervision for safety.  02/25/23-Arm bike x 6 mins level 2 for conditioning, Pt transferred to the seat for the first time with supervision Reviewed previously issued red theraband exercises for UE strength 20 reps each, min v.c  Fine motor coordination exercises with left and right UE's, flipping playing cards and stacking coins with min difficulty using RUE,  picking up coins and placing in container with LUE and flipping cards mod difficulty. Pt attempted placing small pegs into a pegboard with bilateral UE's max difficulty with LUE, mod difficulty with RUE, task was discontinued. Pt reports being a little off. She took her meds without eating and got into a disagreement with transportation this a.m.    02/18/23- re-eval performed and therapist discussed potential goals with patient.  PATIENT EDUCATION: Education details:  safety for cooking fish, review of theraband  exercise Person educated: Patient Education method: explanation, demonstration Education comprehension: verbalized understanding, returned demonstration.  HOME EXERCISE PROGRAM:  12/17/22- coordination HEP, cane exercises seated   GOALS: Goals reviewed with patient? Yes  SHORT TERM GOALS: Target date: 03/20/23  I with initial HEP Baseline: Goal status: met 01/21/23  2.  Pt will demonstrate improved UE functional use as evidenced increasing bilateral box/ blocks score by 4 blocks.  Baseline: R 35, L 26 Goal status  met 12/31/22 RUE :43:  LUE 35    3.  Pt will donn pants with mod A. Baseline: max A Goal status:met  02/18/23- pt performed mod I today  4. Pt will verbalize understanding of adapted strategies to maximize safety and I with ADLs/ IADLs .  Goal status:  ongoing, 02/18/23  5.  Pt will demonstrate ability to stand for functional activity  with min A x 10 mins in prep for ADLs.  Goal status: not consistent, pt stood for 1 min on 02/18/23  6.  Pt will perform w/c to [facility] transfers mod I  Goal status:  met, 02/18/23 7. I with updated HEP  Goal status- ongoing 03/18/23     LONG TERM GOALS: Target date: 05/13/23 I with updated HEP  Goal status: ongoing, 02/18/23  2.  Pt will perform LB dressing mod I. Baseline:max A Goal status:met 03/25/23  3.  Pt will bathe with supervision/ set up. Baseline: min-mod A for sponge bath Goal status: met for sponge bathing at set up    4.  Pt will perform basic cooking at a w/c level mod I  Revised goal: Pt will perform basic cooking modified independently at a walker level.  Goal status:  met, 02/18/23  pt has performed simple tasks mod I w/c level, goal revised - Ongoing, pt is currently performing at a w/c level-03/25/23  5.  Pt will demonstrate improved fine motor coordination as evidenced by performing LUE 9 hole peg test in 2 mins or less.  Revised goal:Pt will demonstrate improved fine motor coordination as evidenced by performing 9 hole peg test in 55 secs or less  Goal status: initial goal met 6/4/246/4/24-  LUE 65 secs , goal revised  6.  Pt will demonstrate improved fine motor coordination as evidenced by performing RUE 9 hole peg test in 1 mins  30 secs or less.  Baseline: RUE  1 min 48 secs, LUE 4 pegs placed in 2 mins  Goal status: met 6/4/246/4/24- RUE 35.46,     7. Pt will perform shower with supervision using tub transfer bench    Goal status: ongoing min A 03/25/23   8. Pt will perform basic home management at a walker level mod I    Goal status: ongoing, 03/25/23 ASSESSMENT:  CLINICAL IMPRESSION: Pt is progressing towards goals. She reports that she was able to got to church this weekend. She also reports cooking spaghetti with her husband's assist ance. Pt demonstrates improved fine motor coordination today.  PERFORMANCE DEFICITS: in functional skills including ADLs, IADLs, coordination, dexterity, sensation, ROM, strength, flexibility, Fine motor control, Gross motor control, mobility, balance, endurance, decreased knowledge of precautions, decreased knowledge of use of DME, and UE functional use, cognitive skills including  and psychosocial skills including coping strategies, environmental adaptation, habits, interpersonal interactions, and routines and behaviors.   IMPAIRMENTS: are limiting patient from ADLs, IADLs, rest and sleep, play, leisure, and social participation.   CO-MORBIDITIES: may have  co-morbidities  that affects occupational performance. Patient will benefit from skilled OT to address above impairments and improve overall function.  MODIFICATION OR ASSISTANCE TO COMPLETE EVALUATION: No modification of tasks or assist necessary to complete an evaluation.  OT OCCUPATIONAL PROFILE AND HISTORY: Detailed assessment: Review of records and additional review of physical, cognitive, psychosocial history related to current functional performance.  CLINICAL DECISION MAKING: LOW - limited treatment options, no task modification necessary  REHAB POTENTIAL: Good  EVALUATION COMPLEXITY: Low    PLAN:  OT FREQUENCY: 2x/week  OT DURATION: 12 weeks  PLANNED INTERVENTIONS: self care/ADL training, therapeutic exercise, therapeutic activity, neuromuscular re-education, manual therapy, passive range of motion, gait training, balance training, stair training, functional mobility training, aquatic therapy, ultrasound, paraffin, moist heat, cryotherapy, patient/family education, energy conservation, coping strategies training, DME and/or AE instructions, and Re-evaluation  RECOMMENDED OTHER SERVICES: PT  CONSULTED AND AGREED WITH PLAN OF CARE: Patient and family member/caregiver  PLAN FOR NEXT SESSION: standing tolerance, transfers, UE strength and coordination, ADL strategies  Huntington Leverich, OT 03/25/2023, 8:25 AM

## 2023-03-25 NOTE — Telephone Encounter (Signed)
Orders Placed This Encounter  Procedures   US Venous Img Lower Bilateral (DVT)   Please let patient know, I ordered stat bilateral lower extremity ultrasound to rule out deep venous thrombosis

## 2023-03-25 NOTE — Telephone Encounter (Signed)
Pt stated her feet are really swollen and she can't hardly walk. Pt requesting a call back from nurse.

## 2023-03-25 NOTE — Addendum Note (Signed)
Addended by: Levert Feinstein on: 03/25/2023 11:21 AM   Modules accepted: Orders

## 2023-03-25 NOTE — Telephone Encounter (Signed)
Returned call to pt who stated that both legs and feet are swollen (ongoing since this past weekend)but the left side is worse. Pt stated doesn't know why she is retaining fluid. She stated furosemide 40mg  BID. She wanted to know if the neurologist would know any reason this could be happening.

## 2023-03-25 NOTE — Telephone Encounter (Signed)
Called patient and informed her of orders and she was agreeable to plan and states she will be on the lookout for the ultrasounds, I advised about ER or urgent care if it gets too bad. Pt had no questions at this time but was encouraged to call back if questions arise.

## 2023-03-25 NOTE — Therapy (Signed)
OUTPATIENT PHYSICAL THERAPY LOWER EXTREMITY TREATMENT   Patient Name: Dana [name] MRN: [id number] DOB:25-Sep-1965, 57 y.o., female Today's Date: 03/25/2023  END OF SESSION:  PT End of Session - 03/25/23 0854     Visit Number 15    Date for PT Re-Evaluation 04/15/23    PT Start Time 0845    PT Stop Time 0925    PT Time Calculation (min) 40 min    Equipment Utilized During Treatment Gait belt    Activity Tolerance Patient tolerated treatment well    Behavior During Therapy [facility] for tasks assessed/performed              Past Medical History:  Diagnosis Date   Anxiety    Back pain with radiation    Breast mass, right    Chronic pain    Chronic, continuous use of opioids    Complication of anesthesia 04/07/14   Allergic reaction to Lisinopril immediately following surgery   DJD (degenerative joint disease)    Fibromyalgia    Groin abscess    Headache(784.0)    History of IBS    Hypercholesterolemia    IBS (irritable bowel syndrome)    Lactose intolerance    Mild hypertension    Obesity    Tobacco use disorder    Umbilical hernia    Symptomatic   Past Surgical History:  Procedure Laterality Date   ABDOMINAL HYSTERECTOMY     ANTERIOR CERVICAL DECOMP/DISCECTOMY FUSION N/A 12/20/2015   Procedure: ANTERIOR CERVICAL DECOMPRESSION FUSION CERVICAL 4-5, CERVICAL 5-6, CERVICAL 6-7 WITH INSTRUMENTATION AND ALLOGRAFT;  Surgeon: Estill Bamberg, MD;  Location: [facility] OR;  Service: Orthopedics;  Laterality: N/A;  Anterior cervical decompression fusion, cervical 4-5, cervical 5-6, cervical 6-7 with instrumentation and allograft   BACK SURGERY     BILATERAL SALPINGECTOMY  09/03/2012   Procedure: BILATERAL SALPINGECTOMY;  Surgeon: Ok Edwards, MD;  Location: [facility] ORS;  Service: Gynecology;  Laterality: Bilateral;   BREAST BIOPSY Right 04/07/2014   Procedure: REMOVAL RIGHT BREAST MASS WITH WIRE LOCALIZATION;  Surgeon: Adolph Pollack, MD;  Location: [facility] OR;  Service: General;   Laterality: Right;   BREAST EXCISIONAL BIOPSY Left    x2   BREAST LUMPECTOMY     x2   CARPAL TUNNEL RELEASE Left 05/11/2019   Procedure: LEFT CARPAL TUNNEL RELEASE, RIGHT TENNIS ELBOW MARCAINE/DEPO MEDROL INJECTION UNDER ANESTHESIA;  Surgeon: Kerrin Champagne, MD;  Location: [facility] OR;  Service: Orthopedics;  Laterality: Left;   COLONOSCOPY W/ BIOPSIES  04/24/2012   per Dr. Leone Payor, clear, repeat in 10 yrs    disectomy     ESOPHAGOGASTRODUODENOSCOPY     FINGER SURGERY     Right index-excision of mass    FOOT SURGERY Right    Bone Spurs   IR FLUORO GUIDE CV LINE RIGHT  01/29/2023   IR REMOVAL TUN CV CATH W/O FL  02/08/2023   IR US GUIDE VASC ACCESS RIGHT  01/31/2023   LAPAROSCOPIC HYSTERECTOMY  09/03/2012   Procedure: HYSTERECTOMY TOTAL LAPAROSCOPIC;  Surgeon: Ok Edwards, MD;  Location: [facility];  Service: Gynecology;  Laterality: N/A;   LUMBAR DISC SURGERY     TUBAL LIGATION     UMBILICAL HERNIA REPAIR N/A 05/01/2018   Procedure: UMBILICAL HERNIA REPAIR;  Surgeon: Jimmye Norman, MD;  Location: [facility] OR;  Service: General;  Laterality: N/A;   Patient Active Problem List   Diagnosis Date Noted   CIDP (chronic inflammatory demyelinating polyneuropathy) (HCC) 01/28/2023   Gait abnormality 01/28/2023  Glaucoma 01/28/2023   Pain due to onychomycosis of toenails of both feet 01/21/2023   Wheelchair dependence 12/30/2022   Nerve pain 12/30/2022   Insomnia due to medical condition 12/30/2022   Acute inflammatory demyelinating polyneuropathy (HCC) 10/01/2022   UTI (urinary tract infection) 09/24/2022   Hypomagnesemia 09/24/2022   Weakness 09/23/2022   Hypokalemia 09/23/2022   B12 deficiency 09/05/2022   Folate deficiency 09/05/2022   Carpal tunnel syndrome, left upper limb 05/11/2019    Class: Chronic   Spinal stenosis of lumbar region with neurogenic claudication 08/20/2018   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   Primary osteoarthritis of right knee 02/27/2017   Right tennis elbow  11/11/2016   Muscle cramps 01/15/2016   Bilateral leg edema 01/08/2016   Radiculopathy 12/20/2015   Hyperglycemia 12/21/2014   Mastodynia, female 11/30/2014   S/P excision of fibroadenoma of breast 11/30/2014   Angioedema of lips 04/07/2014   Fibroadenoma of right breast 03/07/2014   Pelvic pain in female 06/19/2012   Menorrhagia 06/11/2012   SUI (stress urinary incontinence, female) 06/11/2012   HTN (hypertension) 06/11/2012   IBS (irritable bowel syndrome) - diarrhea predominant 03/17/2012   MICROSCOPIC HEMATURIA 11/30/2010   BREAST PAIN, RIGHT 11/30/2010   BREAST MASS, RIGHT 01/12/2010   HYPERCHOLESTEROLEMIA 12/07/2007   CIGARETTE SMOKER 12/07/2007   DEGENERATIVE JOINT DISEASE 12/07/2007   Low back pain with sciatica 12/07/2007   HEADACHE 12/07/2007   Obesity, Class III, BMI 40-49.9 (morbid obesity) (HCC) 08/03/2007   Anxiety state 08/03/2007    PCP: Nelwyn Salisbury, MD  REFERRING PROVIDER: Nelwyn Salisbury, MD  REFERRING DIAG: G61.0 (ICD-10-CM) - Guillain Barr syndrome [facility])   THERAPY DIAG:  Other lack of coordination  Muscle weakness (generalized)  Other abnormalities of gait and mobility  Unsteadiness on feet  Guillain Barr syndrome (HCC)  Difficulty in walking, not elsewhere classified  CIDP (chronic inflammatory demyelinating polyneuropathy) (HCC)  Rationale for Evaluation and Treatment: Rehabilitation  ONSET DATE: 12/05/22  SUBJECTIVE:   SUBJECTIVE STATEMENT: Patient arrives with moderate swelling in BLE, scratchy throat.   OBJECTIVE STATEMENT: 01/28/23 ASSESSMENT AND PLAN   Dana [name] is a 57 y.o. female   Demyelinating polyradiculoneuropathy             Symptom onset monophasic since September 2024, failed to respond to IVIG in January 2024,             EMG nerve conduction study showed demyelinating features, no evidence of axonal loss,             Talk with neurohospitalist Dr. Iver Nestle, ED triage, will send her for hospital admission for  plasma exchange, please add on following labs, immunofixative protein electrophoresis, ANA with reflex, SSA, SSB titer, iron panel             Starting outpatient IVIG prior authorization process,  Dana [name] is a 57 y.o. female who presented to the [facility] ED on 09/23/2022 with bilateral lower extremity weakness with decreased mobility that has progressed to both arms. She also reported numbness and tingling of the extremities. She was transferred to [facility] for MRI. 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  Inpatient Rehab F/B HHPT.  R knee injection 10/08/22 trigger point injections for worsening back pain 2/15  RA  PAIN:  Are you having pain? Yes: NPRS scale: up to 10/10 Pain location: all over, but her R knee pain is worst,  Pain description: Sharp Aggravating factors: Has difficulty standing up Relieving factors: Medicine helps somewhat  PRECAUTIONS: Fall  WEIGHT BEARING RESTRICTIONS: No  FALLS:  Has patient fallen in last 6 months? No  LIVING ENVIRONMENT: Lives with: lives with their family Lives in: House/apartment Stairs: Nohas a ramp Has following equipment at home: Environmental consultant - 2 wheeled, Wheelchair (power), Tour manager, bed side commode, Ramped entry, and [employer] lift  OCCUPATION: on disability due to RA and a fall several years ago  PLOF: Independent  PATIENT GOALS: walk  NEXT MD VISIT: She is scheduled to follow up with Dr. Serita Sheller with Physical Med/Rehab on 12-30-22. She is scheduled to follow up with Dr. Levert Feinstein at Neurology on 01-28-23.   OBJECTIVE:   DIAGNOSTIC FINDINGS:  Right knee radiographs 11/09/2020   FINDINGS: Severe patellofemoral joint space narrowing. Severe superior and mild inferior patellar degenerative osteophytosis. Moderate superior trochlear degenerative osteophytosis. No joint effusion. Severe medial compartment joint space narrowing with moderate peripheral medial and lateral compartment degenerative  osteophytes.  COGNITION: Overall cognitive status: Within functional limits for tasks assessed     SENSATION: Light touch: Impaired  and B hands and feet and lower legs  EDEMA:  BLE edema noted.  MUSCLE LENGTH: Hamstrings: WFL Thomas test: B tightness, to neutral  POSTURE: rounded shoulders and flexed trunk   PALPATION: No TTP  LOWER EXTREMITY ROM: BLE ROM limited in all planes an all joints due to body habitus   LOWER EXTREMITY MMT:  MMT Right eval Left eval  Hip flexion 3 3-  Hip extension 3- 3-  Hip abduction 3 3-  Hip adduction    Hip internal rotation    Hip external rotation    Knee flexion 4 4-  Knee extension 4- 4-  Ankle dorsiflexion 4- 4-  Ankle plantarflexion    Ankle inversion 4- 4-  Ankle eversion 4- 4-   (Blank rows = not tested)  FUNCTIONAL TESTS:  5 times sit to stand: unable to complete Timed up and go (TUG): unable to complete  Bed Mobility: Occasional min A for sup <> sit, rolled B on mat with great effort, light min A  TRANSFERS: Scooting transfer with CGA, stand pivot with CGA, she reports that she sometimes has difficulty rising due to her knee pain  GAIT: Distance walked: 0' Assistive device utilized:  shopping cart-will bring her RW from home. Level of assistance: Min A Comments: Stood and weight shifted, then stood again and took steps to turn to W/C.  TODAY'S TREATMENT:                                                                                                                              DATE:  03/25/23 Seated long kicks, 2#, 2 x 10 reps each leg. Supine with legs elevated over physioball-bridge x 10, roll side to side x 10, isometric hip abd and add, 2 x 10 reps Performed bed mobility and squat pivot transfers with S. Fitter press, ULE 1 x 10 each leg, 2 blue bands Ambulation 2 x  20' with RW and CGA, stable.  03/18/23 Seated long kicks with 3# weights, 2 x 10 B. Supine with legs over physioball, bridge x 10 Attempted  bridge with hip IR, but she developed a severe L HS cramp.  NuStep L5 x 2 minutes, then 2 x 30 sec fast pedal. Tried to increase resistance, but too painful in R knee. Seated rotational weight shifts, sliding hands out to the side while lifting opposite leg to surface of mat. 3 x 5 reps to each side. Improved stability noted.  03/13/23 Attempted to pedal bike, but it hurt her R knee NuStep-L5 x 4 min, then 3 x 30 sec fast pedal at L3 Ambulation with RW, CGA 1 x 20', then 1 x 40' including turn around. Standing stepping out and back in all directions, alternating legs, with BUE support on RW. Increased speed and efficiency with repetition. Standing step forward to pick up an object, then back and turn to place it on mat behind her. 4 reps each side. Difficulty performing in L stance without BUE support, but able to perform x 3 with UUE support in RLE SLS.  03/04/23 Supine LE and trunk stabilization with BLE over physioball-bridge, bridge with B hip IR, SLR off the ball while stabilizing with opposite leg, 10 each, did have some cramping in R hip adductors Supine rolling to L side lie with R arm and leg held in the air for trunk stability, 3 x each direction. Supine to side lie to sit from r and from L. Standing alternating taps on 4" step with BUE support on RW, CGA, 10 reps each Ambulation with RW, 1 x 25', including 180 degree turn. CGA Standing weight shifts with straight cane and CGA, able to shift and lift heels alternately.  02/27/23 NuStep L5 x 4 minutes, L ant/lat knee pain Fitter press blue and black 2 x 10 each direction Ambulated x 20' with RW and CGA Seated rotational slides- moving hands out to the side and back, 2 x 5 to each side. Added Leg extension to second set. Alternating step taps on 2" step with BUE support on RW B side stepping in front of mat with RW, CGA, 2 x 5 steps to each side. Ambulated x 20' with RW and CGA  PATIENT EDUCATION:  Education details: POC Person  educated: Patient Education method: Explanation Education comprehension: verbalized understanding  HOME EXERCISE PROGRAM: [id number]  ASSESSMENT:  CLINICAL IMPRESSION: Patient arrives with BLE swelling, scratchy throat. Provided progressive challenges to patient to assess her tolerance. She was able to fully participate in all activities today.  OBJECTIVE IMPAIRMENTS: Abnormal gait, decreased activity tolerance, decreased balance, decreased coordination, decreased endurance, difficulty walking, decreased ROM, decreased strength, and postural dysfunction.   ACTIVITY LIMITATIONS: carrying, lifting, bending, standing, squatting, stairs, transfers, and locomotion level  PARTICIPATION LIMITATIONS: meal prep, cleaning, laundry, driving, and shopping  PERSONAL FACTORS: Fitness, Past/current experiences, and 1 comorbidity: Recently diagnosed with GBS  are also affecting patient's functional outcome.   REHAB POTENTIAL: Good  CLINICAL DECISION MAKING: Evolving/moderate complexity  EVALUATION COMPLEXITY: Moderate   GOALS: Goals reviewed with patient? Yes  SHORT TERM GOALS: Target date: 11/28/22 I with initial HEP Baseline: Goal status: met 01/09/23  LONG TERM GOALS: Target date: 03/06/23  I with final HEP Baseline:  Goal status: 03/18/23, ongoing  2.  Decrease 5x STS to < 14 sec Baseline: unable to complete Goal status: 02/18/23-39 sec-ongoing  3.  Decrease TUG to < 20 sec Baseline: Unable to complete. Goal status:  02/18/23 1:55 Ongoing  4.  Patient will ambulate at least 300' with LRAD, MI, on level and unlevel surfaces. Baseline: Stand pivot transfer with CGA Goal status: 02/18/23-20', RW, CGA, slow, but stable throughout. Ongoing  5.  Perform all bed mobility with I Baseline: Occ min A on mat Goal status: 02/18/23-met  6.  Increase BLE strength to at least 4/5 throughout. Baseline: (3-)-3/5 Goal status: 02/18/23-RLE 4-/5, LLE 3+/5, Ongoing  7. Patient and husband will safely  perform car transfers with LRAD Baseline: Currently uses van transportation Goal Status: 02/25/23 met  PLAN:  PT FREQUENCY: 1-2x/week  PT DURATION: 12 weeks  PLANNED INTERVENTIONS: Therapeutic exercises, Therapeutic activity, Neuromuscular re-education, Balance training, Gait training, Patient/Family education, Self Care, Joint mobilization, Stair training, Dry Needling, Electrical stimulation, Cryotherapy, Moist heat, Ultrasound, Ionotophoresis 4mg /ml Dexamethasone, and Manual therapy  PLAN FOR NEXT SESSION: Initiate HEP  Oley Balm DPT

## 2023-03-26 NOTE — Therapy (Signed)
OUTPATIENT OCCUPATIONAL THERAPY NEURO Treatment  Patient Name: Dana [name] MRN: [id number] DOB:01-26-1966, 57 y.o., female Today's Date: 03/27/2023  PCP: Dr. Clent Ridges REFERRING PROVIDER: Dr. Gershon Crane  END OF SESSION:  OT End of Session - 03/27/23 1107     Visit Number 17    Number of Visits 35    Date for OT Re-Evaluation 05/13/23    Authorization Type UHC, Medicare    Authorization - Visit Number 17    Progress Note Due on Visit 20    OT Start Time 1103    OT Stop Time 1143    OT Time Calculation (min) 40 min                    Past Medical History:  Diagnosis Date   Anxiety    Back pain with radiation    Breast mass, right    Chronic pain    Chronic, continuous use of opioids    Complication of anesthesia 04/07/14   Allergic reaction to Lisinopril immediately following surgery   DJD (degenerative joint disease)    Fibromyalgia    Groin abscess    Headache(784.0)    History of IBS    Hypercholesterolemia    IBS (irritable bowel syndrome)    Lactose intolerance    Mild hypertension    Obesity    Tobacco use disorder    Umbilical hernia    Symptomatic   Past Surgical History:  Procedure Laterality Date   ABDOMINAL HYSTERECTOMY     ANTERIOR CERVICAL DECOMP/DISCECTOMY FUSION N/A 12/20/2015   Procedure: ANTERIOR CERVICAL DECOMPRESSION FUSION CERVICAL 4-5, CERVICAL 5-6, CERVICAL 6-7 WITH INSTRUMENTATION AND ALLOGRAFT;  Surgeon: Estill Bamberg, MD;  Location: [facility] OR;  Service: Orthopedics;  Laterality: N/A;  Anterior cervical decompression fusion, cervical 4-5, cervical 5-6, cervical 6-7 with instrumentation and allograft   BACK SURGERY     BILATERAL SALPINGECTOMY  09/03/2012   Procedure: BILATERAL SALPINGECTOMY;  Surgeon: Ok Edwards, MD;  Location: [facility];  Service: Gynecology;  Laterality: Bilateral;   BREAST BIOPSY Right 04/07/2014   Procedure: REMOVAL RIGHT BREAST MASS WITH WIRE LOCALIZATION;  Surgeon: Adolph Pollack, MD;  Location: [facility] OR;   Service: General;  Laterality: Right;   BREAST EXCISIONAL BIOPSY Left    x2   BREAST LUMPECTOMY     x2   CARPAL TUNNEL RELEASE Left 05/11/2019   Procedure: LEFT CARPAL TUNNEL RELEASE, RIGHT TENNIS ELBOW MARCAINE/DEPO MEDROL INJECTION UNDER ANESTHESIA;  Surgeon: Kerrin Champagne, MD;  Location: [facility] OR;  Service: Orthopedics;  Laterality: Left;   COLONOSCOPY W/ BIOPSIES  04/24/2012   per Dr. Leone Payor, clear, repeat in 10 yrs    disectomy     ESOPHAGOGASTRODUODENOSCOPY     FINGER SURGERY     Right index-excision of mass    FOOT SURGERY Right    Bone Spurs   IR FLUORO GUIDE CV LINE RIGHT  01/29/2023   IR REMOVAL TUN CV CATH W/O FL  02/08/2023   IR US GUIDE VASC ACCESS RIGHT  01/31/2023   LAPAROSCOPIC HYSTERECTOMY  09/03/2012   Procedure: HYSTERECTOMY TOTAL LAPAROSCOPIC;  Surgeon: Ok Edwards, MD;  Location: [facility];  Service: Gynecology;  Laterality: N/A;   LUMBAR DISC SURGERY     TUBAL LIGATION     UMBILICAL HERNIA REPAIR N/A 05/01/2018   Procedure: UMBILICAL HERNIA REPAIR;  Surgeon: Jimmye Norman, MD;  Location: [facility] OR;  Service: General;  Laterality: N/A;   Patient Active Problem List   Diagnosis Date Noted  CIDP (chronic inflammatory demyelinating polyneuropathy) (HCC) 01/28/2023   Gait abnormality 01/28/2023   Glaucoma 01/28/2023   Pain due to onychomycosis of toenails of both feet 01/21/2023   Wheelchair dependence 12/30/2022   Nerve pain 12/30/2022   Insomnia due to medical condition 12/30/2022   Acute inflammatory demyelinating polyneuropathy (HCC) 10/01/2022   UTI (urinary tract infection) 09/24/2022   Hypomagnesemia 09/24/2022   Weakness 09/23/2022   Hypokalemia 09/23/2022   B12 deficiency 09/05/2022   Folate deficiency 09/05/2022   Carpal tunnel syndrome, left upper limb 05/11/2019    Class: Chronic   Spinal stenosis of lumbar region with neurogenic claudication 08/20/2018   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   Primary osteoarthritis of right knee 02/27/2017    Right tennis elbow 11/11/2016   Muscle cramps 01/15/2016   Bilateral leg edema 01/08/2016   Radiculopathy 12/20/2015   Hyperglycemia 12/21/2014   Mastodynia, female 11/30/2014   S/P excision of fibroadenoma of breast 11/30/2014   Angioedema of lips 04/07/2014   Fibroadenoma of right breast 03/07/2014   Pelvic pain in female 06/19/2012   Menorrhagia 06/11/2012   SUI (stress urinary incontinence, female) 06/11/2012   HTN (hypertension) 06/11/2012   IBS (irritable bowel syndrome) - diarrhea predominant 03/17/2012   MICROSCOPIC HEMATURIA 11/30/2010   BREAST PAIN, RIGHT 11/30/2010   BREAST MASS, RIGHT 01/12/2010   HYPERCHOLESTEROLEMIA 12/07/2007   CIGARETTE SMOKER 12/07/2007   DEGENERATIVE JOINT DISEASE 12/07/2007   Low back pain with sciatica 12/07/2007   HEADACHE 12/07/2007   Obesity, Class III, BMI 40-49.9 (morbid obesity) (HCC) 08/03/2007   Anxiety state 08/03/2007    ONSET DATE: 12/05/22- referral date  REFERRING DIAG: G61.0 (ICD-10-CM) - Guillain Barr syndrome (HCC)  THERAPY DIAG:    Rationale for Evaluation and Treatment: Rehabilitation  SUBJECTIVE:   SUBJECTIVE STATEMENT: Pt reports difficulty applying lotion to her feet PERTINENT HISTORY: Pt is a 57 y/o female hospitalized 09/23/22  with progressive weakness and sensation changes, consistent with Guillain-Barr syndrome. MRI negative. LP results and assessment most consistent with Guillain-Barr syndrome.  Pt was d/c home from CIR 11/06/22 PMH includes: glaucoma anxiety, DJD, fibromyalgia, HTN, tobacco use, B-12 deficiency, OA, ACDF 2017.  Pt was re-hospitalized 01/28/2456 with CIDP (chronic inflammatory demyelinating polyneuropathy). Saw Neuro on day of admission, NCS showed demyelination, referred to hospital for PLEX. Initially diagnosed with an acute demyelinating neuropathy Jan 2024, treated with IVIG. PMH: Spinal stenosis, HTN, Obesity   PRECAUTIONS: Fall  WEIGHT BEARING RESTRICTIONS: no  PAIN:  Are you having  pain? Yes: 4/10 NPRS scale: mild pain Pain location: R knee Pain description: aching Aggravating factors: standing Relieving factors: rest    FALLS: Has patient fallen in last 6 months? No  LIVING ENVIRONMENT: Lives with: lives with their spouse Lives in: House/apartment Stairs: has ramp built Has following equipment at home:   BSC, steadi,  tub bench but can't access bathroom   PLOF: Independent  PATIENT GOALS: increase independence   OBJECTIVE:   HAND DOMINANCE: Right  ADLs: from initial eval Overall ADLs: assistance required, unable to access BR Transfers/ambulation related to ADLs: Eating: unable to cut food Grooming: set up UB Dressing: set up for bra and shirt LB Dressing: max A Toileting: uses BSC uses steadi Bathing: min-mod A in hospital  bed  Tub Shower transfers: n/a Equipment: Emergency planning/management officer  IADLs:dependent for IADLS   MOBILITY STATUS:  uses w/c  , per PT report minguard for transfer from w/c to mat    ACTIVITY TOLERANCE: Activity tolerance: Pt fatigues quickly  UPPER EXTREMITY ROM:  see updates for 02/18/23 re-eval   Active ROM Right 02/18/23 Left 02/18/23  Shoulder flexion 130 120  Shoulder abduction 90 90  Shoulder adduction    Shoulder extension    Shoulder internal rotation    Shoulder external rotation    Elbow flexion    Elbow extension  -5  Wrist flexion    Wrist extension    Wrist ulnar deviation    Wrist radial deviation    Wrist pronation    Wrist supination    (Blank rows = not tested)  UPPER EXTREMITY MMT:   see below for re-eval 02/18/23  MMT Right 02/18/23 Left 02/18/23  Shoulder flexion 3+/5 3+/5  Shoulder abduction    Shoulder adduction    Shoulder extension    Shoulder internal rotation    Shoulder external rotation    Middle trapezius    Lower trapezius    Elbow flexion 4/5 4/5  Elbow extension 4-/5 4-/5  Wrist flexion    Wrist extension    Wrist ulnar deviation    Wrist radial deviation    Wrist  pronation    Wrist supination      HAND FUNCTION: Grip strength: Right: 32 lbs; Left: 18 lbs, 02/18/23 R 55 lbs, L 35 lbs  COORDINATION: 9 Hole Peg test:initial eval  Right: 1 min 48 sec; Left: 4 pegs in 2 mins  Box and Blocks:  Right 35 blocks, Left  26 blocks 02/18/23- RUE 35.46, LUE 65 secs  SENSATION: Light touch: WFL Hot/Cold: Not tested    COGNITION: Overall cognitive status:NT   VISION: Subjective report: reports hx of glaucoma VISION ASSESSMENT: Not tested  OBSERVATIONS: Pt is eager to improve, pt's husband is very supportive   TODAY'S TREATMENT:                                                                                                                              DATE: 03/27/23-Theraband exercises for biceps curls, triceps, rowing  and shoulder extension with green band and shoulder abduction with red band 10-15 reps each, min v.c, rest break in between sets Standing at countertop to place and remove items from lowest overhead shelf, seated rest break prior to replacing, close supervision for moblity with walker  Gripper set at level 2 sustained grip to pick up 1 inch blocks, 1/2 with RUE with min difficulty and 1/2 with left with min-mod difficulty for sustained grip Graded clothespins for sustained pinch 1-8# using left and right UE's for sustained pinch, RUE only with black clothespins dur to difficulty. Therapist recommends that pt orders a long handled sponge to apply lotion onto legs. Pt with continued bilateral LE swelling.  03/25/23 Pt is hoarse today and reports a tickling in her throat as well as LE swelling. Therapist recommends pt calls MD.Pt arrived late as her transportation did not pick her up. Arm bike x 6 mins level 1 for conditioning.Pt transferred to and from arm bike  to w/c with min A. Copying small peg design for increased fine motor coordination with LUE, min difficulty and increased time, removing with in hand manipulation. Pt demonstrates  improved fine motor coordination.  03/18/23- Pt amb across gym with RW and minguard to arm bike.  Arm bike x 7 mins level 1  for conditioning Placing small pegs into pegboard with LUE for increased fine motor coordination, pt with improved performance and coordination today. Pt walked 3 ft from chair to counter and stood to place and remove items from overhead shelves with 1 seated rest break. Discussion with patient regarding her home situation,. Pt reports frustration and she feels like she is a burden on husband. Therapist recommends pt seeks counseling. Shoulder abduction  with red band biceps curls with green band  20 reps for UE strengthening   03/13/23-Pt reports she had a stomach bug and she wasn't feeling well. Placing large pegs into pegboard with LUE with occasional min difficulty, then removing with 3 pt pinch min v.c to avoid shoulder hike. Red theraband exercises for shoulder abduction, rowing and extension 15 reps each,  green band for biceps curls and triceps extension 15 reps each, min v.c Arm bike x 6 mins level 3 for conditioning, pt transfered to and from with min A Discussed safety for cooking and recommendation that if pt decides to fry fish, she performs with someone for safety, and she keeps her LUE away from hot surfaces due to sensory deficits. Pt verbalized understanding.   03/04/23 Red theraband exercises for shoulder abduction, rowing and extension 20 reps each, upgraded to green band for biceps curls and triceps extension 20 reps each, min v.c Arm bike x 8 mins, 4 mins each direction, level 2 for conditioning, pt transferred to and from seat with supervision. Coordination activities with LUE: flipping and dealing cards, min difficulty/ v.c  02/27/23-Arm bike x 6 mins level 2 for conditioning (Pt transfers to and from w/c with supervision) Placing large pegs into pegboard with LUE then removing for increased fine motor coordination, min difficulty/ v.c  Standing at  countertop with supervision to place and remove items with right then left UE's, pt stood x 3 for 30 secs to 1 min 15 secs, rest breaks in between Gripper set at level 1 to pick up 1 inch blocks  with right and left UE's for sustained grip, min difficulty Pt reports amb to BR with walker without assist, therapist recommends supervision for safety.  02/25/23-Arm bike x 6 mins level 2 for conditioning, Pt transferred to the seat for the first time with supervision Reviewed previously issued red theraband exercises for UE strength 20 reps each, min v.c  Fine motor coordination exercises with left and right UE's, flipping playing cards and stacking coins with min difficulty using RUE, picking up coins and placing in container with LUE and flipping cards mod difficulty. Pt attempted placing small pegs into a pegboard with bilateral UE's max difficulty with LUE, mod difficulty with RUE, task was discontinued. Pt reports being a little off. She took her meds without eating and got into a disagreement with transportation this a.m.    02/18/23- re-eval performed and therapist discussed potential goals with patient.  PATIENT EDUCATION: Education details:   see above Person educated: Patient Education method: explanation, demonstration Education comprehension: verbalized understanding, returned demonstration.  HOME EXERCISE PROGRAM:  12/17/22- coordination HEP, cane exercises seated   GOALS: Goals reviewed with patient? Yes  SHORT TERM GOALS: Target date: 03/20/23  I with initial  HEP Baseline: Goal status: met 01/21/23  2.  Pt will demonstrate improved UE functional use as evidenced increasing bilateral box/ blocks score by 4 blocks.  Baseline: R 35, L 26 Goal status  met 12/31/22 RUE :43:  LUE 35    3.  Pt will donn pants with mod A. Baseline: max A Goal status:met  02/18/23- pt performed mod I today  4. Pt will verbalize understanding of adapted strategies to maximize safety and I with ADLs/  IADLs .  Goal status:  ongoing, 02/18/23  5.  Pt will demonstrate ability to stand for functional activity with min A x 10 mins in prep for ADLs.  Goal status: not consistent, pt stood for 1 min on 02/18/23  6.  Pt will perform w/c to [facility] transfers mod I  Goal status:  met, 02/18/23 7. I with updated HEP  Goal status- ongoing 03/18/23     LONG TERM GOALS: Target date: 05/13/23 I with updated HEP  Goal status: ongoing, 02/18/23  2.  Pt will perform LB dressing mod I. Baseline:max A Goal status:met 03/25/23  3.  Pt will bathe with supervision/ set up. Baseline: min-mod A for sponge bath Goal status: met for sponge bathing at set up   4.  Pt will perform basic cooking at a w/c level mod I  Revised goal: Pt will perform basic cooking modified independently at a walker level.  Goal status:  met, 02/18/23  pt has performed simple tasks mod I w/c level, goal revised - Ongoing, pt is currently performing at a w/c level-03/25/23  5.  Pt will demonstrate improved fine motor coordination as evidenced by performing LUE 9 hole peg test in 2 mins or less.  Revised goal:Pt will demonstrate improved fine motor coordination as evidenced by performing 9 hole peg test in 55 secs or less  Goal status: initial goal met 6/4/246/4/24-  LUE 65 secs , goal revised  6.  Pt will demonstrate improved fine motor coordination as evidenced by performing RUE 9 hole peg test in 1 mins  30 secs or less.  Baseline: RUE  1 min 48 secs, LUE 4 pegs placed in 2 mins  Goal status: met 6/4/246/4/24- RUE 35.46,     7. Pt will perform shower with supervision using tub transfer bench    Goal status: ongoing min A 03/25/23   8. Pt will perform basic home management at a walker level mod I    Goal status: ongoing, 03/25/23 ASSESSMENT:  CLINICAL IMPRESSION: Pt is progressing towards goals. She demonstrates improving strength and functional mobility PERFORMANCE DEFICITS: in functional skills including ADLs, IADLs, coordination,  dexterity, sensation, ROM, strength, flexibility, Fine motor control, Gross motor control, mobility, balance, endurance, decreased knowledge of precautions, decreased knowledge of use of DME, and UE functional use, cognitive skills including  and psychosocial skills including coping strategies, environmental adaptation, habits, interpersonal interactions, and routines and behaviors.   IMPAIRMENTS: are limiting patient from ADLs, IADLs, rest and sleep, play, leisure, and social participation.   CO-MORBIDITIES: may have co-morbidities  that affects occupational performance. Patient will benefit from skilled OT to address above impairments and improve overall function.  MODIFICATION OR ASSISTANCE TO COMPLETE EVALUATION: No modification of tasks or assist necessary to complete an evaluation.  OT OCCUPATIONAL PROFILE AND HISTORY: Detailed assessment: Review of records and additional review of physical, cognitive, psychosocial history related to current functional performance.  CLINICAL DECISION MAKING: LOW - limited treatment options, no task modification necessary  REHAB POTENTIAL: Good  EVALUATION  COMPLEXITY: Low    PLAN:  OT FREQUENCY: 2x/week  OT DURATION: 12 weeks  PLANNED INTERVENTIONS: self care/ADL training, therapeutic exercise, therapeutic activity, neuromuscular re-education, manual therapy, passive range of motion, gait training, balance training, stair training, functional mobility training, aquatic therapy, ultrasound, paraffin, moist heat, cryotherapy, patient/family education, energy conservation, coping strategies training, DME and/or AE instructions, and Re-evaluation  RECOMMENDED OTHER SERVICES: PT  CONSULTED AND AGREED WITH PLAN OF CARE: Patient and family member/caregiver  PLAN FOR NEXT SESSION: standing tolerance, transfers, UE strength and coordination, ADL strategies  Albion Weatherholtz, OT 03/27/2023, 11:11 AM

## 2023-03-27 ENCOUNTER — Encounter: Payer: Self-pay | Admitting: Occupational Therapy

## 2023-03-27 ENCOUNTER — Encounter: Payer: Self-pay | Admitting: Physical Therapy

## 2023-03-27 ENCOUNTER — Ambulatory Visit: Payer: 59 | Admitting: Occupational Therapy

## 2023-03-27 ENCOUNTER — Telehealth: Payer: Self-pay | Admitting: Neurology

## 2023-03-27 ENCOUNTER — Ambulatory Visit: Payer: 59 | Admitting: Physical Therapy

## 2023-03-27 DIAGNOSIS — R2689 Other abnormalities of gait and mobility: Secondary | ICD-10-CM

## 2023-03-27 DIAGNOSIS — R278 Other lack of coordination: Secondary | ICD-10-CM | POA: Diagnosis not present

## 2023-03-27 DIAGNOSIS — G6181 Chronic inflammatory demyelinating polyneuritis: Secondary | ICD-10-CM

## 2023-03-27 DIAGNOSIS — R262 Difficulty in walking, not elsewhere classified: Secondary | ICD-10-CM

## 2023-03-27 DIAGNOSIS — G610 Guillain-Barre syndrome: Secondary | ICD-10-CM

## 2023-03-27 DIAGNOSIS — R2681 Unsteadiness on feet: Secondary | ICD-10-CM

## 2023-03-27 DIAGNOSIS — M6281 Muscle weakness (generalized): Secondary | ICD-10-CM

## 2023-03-27 NOTE — Telephone Encounter (Signed)
Please start home health IVIG for CIDP on this patient.  2g/kg divided into 4 days followed by 1g/kg every 3 weeks.

## 2023-03-27 NOTE — Therapy (Signed)
OUTPATIENT PHYSICAL THERAPY LOWER EXTREMITY TREATMENT   Patient Name: Dana [name] MRN: [id number] DOB:[birth year], 57 y.o., female Today's Date: 03/27/2023  END OF SESSION:  PT End of Session - 03/27/23 1021     Visit Number 16    Date for PT Re-Evaluation 04/15/23    PT Start Time 1017    PT Stop Time 1055    PT Time Calculation (min) 38 min    Activity Tolerance Patient tolerated treatment well    Behavior During Therapy [facility] for tasks assessed/performed              Past Medical History:  Diagnosis Date   Anxiety    Back pain with radiation    Breast mass, right    Chronic pain    Chronic, continuous use of opioids    Complication of anesthesia 04/07/14   Allergic reaction to Lisinopril immediately following surgery   DJD (degenerative joint disease)    Fibromyalgia    Groin abscess    Headache(784.0)    History of IBS    Hypercholesterolemia    IBS (irritable bowel syndrome)    Lactose intolerance    Mild hypertension    Obesity    Tobacco use disorder    Umbilical hernia    Symptomatic   Past Surgical History:  Procedure Laterality Date   ABDOMINAL HYSTERECTOMY     ANTERIOR CERVICAL DECOMP/DISCECTOMY FUSION N/A 12/20/2015   Procedure: ANTERIOR CERVICAL DECOMPRESSION FUSION CERVICAL 4-5, CERVICAL 5-6, CERVICAL 6-7 WITH INSTRUMENTATION AND ALLOGRAFT;  Surgeon: Estill Bamberg, MD;  Location: [facility] OR;  Service: Orthopedics;  Laterality: N/A;  Anterior cervical decompression fusion, cervical 4-5, cervical 5-6, cervical 6-7 with instrumentation and allograft   BACK SURGERY     BILATERAL SALPINGECTOMY  09/03/2012   Procedure: BILATERAL SALPINGECTOMY;  Surgeon: Ok Edwards, MD;  Location: [facility] ORS;  Service: Gynecology;  Laterality: Bilateral;   BREAST BIOPSY Right 04/07/2014   Procedure: REMOVAL RIGHT BREAST MASS WITH WIRE LOCALIZATION;  Surgeon: Adolph Pollack, MD;  Location: [facility] OR;  Service: General;  Laterality: Right;   BREAST EXCISIONAL BIOPSY Left     x2   BREAST LUMPECTOMY     x2   CARPAL TUNNEL RELEASE Left 05/11/2019   Procedure: LEFT CARPAL TUNNEL RELEASE, RIGHT TENNIS ELBOW MARCAINE/DEPO MEDROL INJECTION UNDER ANESTHESIA;  Surgeon: Kerrin Champagne, MD;  Location: [facility] OR;  Service: Orthopedics;  Laterality: Left;   COLONOSCOPY W/ BIOPSIES  04/24/2012   per Dr. Leone Payor, clear, repeat in 10 yrs    disectomy     ESOPHAGOGASTRODUODENOSCOPY     FINGER SURGERY     Right index-excision of mass    FOOT SURGERY Right    Bone Spurs   IR FLUORO GUIDE CV LINE RIGHT  01/29/2023   IR REMOVAL TUN CV CATH W/O FL  02/08/2023   IR US GUIDE VASC ACCESS RIGHT  01/31/2023   LAPAROSCOPIC HYSTERECTOMY  09/03/2012   Procedure: HYSTERECTOMY TOTAL LAPAROSCOPIC;  Surgeon: Ok Edwards, MD;  Location: [facility] ORS;  Service: Gynecology;  Laterality: N/A;   LUMBAR DISC SURGERY     TUBAL LIGATION     UMBILICAL HERNIA REPAIR N/A 05/01/2018   Procedure: UMBILICAL HERNIA REPAIR;  Surgeon: Jimmye Norman, MD;  Location: [facility] OR;  Service: General;  Laterality: N/A;   Patient Active Problem List   Diagnosis Date Noted   CIDP (chronic inflammatory demyelinating polyneuropathy) (HCC) 01/28/2023   Gait abnormality 01/28/2023   Glaucoma 01/28/2023   Pain due to onychomycosis of  toenails of both feet 01/21/2023   Wheelchair dependence 12/30/2022   Nerve pain 12/30/2022   Insomnia due to medical condition 12/30/2022   Acute inflammatory demyelinating polyneuropathy (HCC) 10/01/2022   UTI (urinary tract infection) 09/24/2022   Hypomagnesemia 09/24/2022   Weakness 09/23/2022   Hypokalemia 09/23/2022   B12 deficiency 09/05/2022   Folate deficiency 09/05/2022   Carpal tunnel syndrome, left upper limb 05/11/2019    Class: Chronic   Spinal stenosis of lumbar region with neurogenic claudication 08/20/2018   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   Primary osteoarthritis of right knee 02/27/2017   Right tennis elbow 11/11/2016   Muscle cramps 01/15/2016   Bilateral leg  edema 01/08/2016   Radiculopathy 12/20/2015   Hyperglycemia 12/21/2014   Mastodynia, female 11/30/2014   S/P excision of fibroadenoma of breast 11/30/2014   Angioedema of lips 04/07/2014   Fibroadenoma of right breast 03/07/2014   Pelvic pain in female 06/19/2012   Menorrhagia 06/11/2012   SUI (stress urinary incontinence, female) 06/11/2012   HTN (hypertension) 06/11/2012   IBS (irritable bowel syndrome) - diarrhea predominant 03/17/2012   MICROSCOPIC HEMATURIA 11/30/2010   BREAST PAIN, RIGHT 11/30/2010   BREAST MASS, RIGHT 01/12/2010   HYPERCHOLESTEROLEMIA 12/07/2007   CIGARETTE SMOKER 12/07/2007   DEGENERATIVE JOINT DISEASE 12/07/2007   Low back pain with sciatica 12/07/2007   HEADACHE 12/07/2007   Obesity, Class III, BMI 40-49.9 (morbid obesity) (HCC) 08/03/2007   Anxiety state 08/03/2007    PCP: Nelwyn Salisbury, MD  REFERRING PROVIDER: Nelwyn Salisbury, MD  REFERRING DIAG: G61.0 (ICD-10-CM) - Guillain Barr syndrome [facility])   THERAPY DIAG:  Other lack of coordination  Muscle weakness (generalized)  Other abnormalities of gait and mobility  Unsteadiness on feet  Guillain Barr syndrome (HCC)  Difficulty in walking, not elsewhere classified  CIDP (chronic inflammatory demyelinating polyneuropathy) (HCC)  Rationale for Evaluation and Treatment: Rehabilitation  ONSET DATE: 12/05/22  SUBJECTIVE:   SUBJECTIVE STATEMENT: Patient arrives with moderate swelling in BLE, scratchy throat.   OBJECTIVE STATEMENT: 01/28/23 ASSESSMENT AND PLAN   Dana [name] is a 57 y.o. female   Demyelinating polyradiculoneuropathy             Symptom onset monophasic since September 2024, failed to respond to IVIG in January 2024,             EMG nerve conduction study showed demyelinating features, no evidence of axonal loss,             Talk with neurohospitalist Dr. Iver Nestle, ED triage, will send her for hospital admission for plasma exchange, please add on following labs,  immunofixative protein electrophoresis, ANA with reflex, SSA, SSB titer, iron panel             Starting outpatient IVIG prior authorization process,  Dana [name] is a 57 y.o. female who presented to the [facility] ED on 09/23/2022 with bilateral lower extremity weakness with decreased mobility that has progressed to both arms. She also reported numbness and tingling of the extremities. She was transferred to [facility] for MRI. 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  Inpatient Rehab F/B HHPT.  R knee injection 10/08/22 trigger point injections for worsening back pain 2/15  RA  PAIN:  Are you having pain? Yes: NPRS scale: up to 10/10 Pain location: all over, but her R knee pain is worst,  Pain description: Sharp Aggravating factors: Has difficulty standing up Relieving factors: Medicine helps somewhat  PRECAUTIONS: Fall  WEIGHT BEARING RESTRICTIONS: No  FALLS:  Has patient fallen in last 6 months? No  LIVING ENVIRONMENT: Lives with: lives with their family Lives in: House/apartment Stairs: Nohas a ramp Has following equipment at home: Environmental consultant - 2 wheeled, Wheelchair (power), Tour manager, bed side commode, Ramped entry, and [employer] lift  OCCUPATION: on disability due to RA and a fall several years ago  PLOF: Independent  PATIENT GOALS: walk  NEXT MD VISIT: She is scheduled to follow up with Dr. Serita Sheller with Physical Med/Rehab on 12-30-22. She is scheduled to follow up with Dr. Levert Feinstein at Neurology on 01-28-23.   OBJECTIVE:   DIAGNOSTIC FINDINGS:  Right knee radiographs 11/09/2020   FINDINGS: Severe patellofemoral joint space narrowing. Severe superior and mild inferior patellar degenerative osteophytosis. Moderate superior trochlear degenerative osteophytosis. No joint effusion. Severe medial compartment joint space narrowing with moderate peripheral medial and lateral compartment degenerative osteophytes.  COGNITION: Overall cognitive status: Within  functional limits for tasks assessed     SENSATION: Light touch: Impaired  and B hands and feet and lower legs  EDEMA:  BLE edema noted.  MUSCLE LENGTH: Hamstrings: WFL Thomas test: B tightness, to neutral  POSTURE: rounded shoulders and flexed trunk   PALPATION: No TTP  LOWER EXTREMITY ROM: BLE ROM limited in all planes an all joints due to body habitus   LOWER EXTREMITY MMT:  MMT Right eval Left eval  Hip flexion 3 3-  Hip extension 3- 3-  Hip abduction 3 3-  Hip adduction    Hip internal rotation    Hip external rotation    Knee flexion 4 4-  Knee extension 4- 4-  Ankle dorsiflexion 4- 4-  Ankle plantarflexion    Ankle inversion 4- 4-  Ankle eversion 4- 4-   (Blank rows = not tested)  FUNCTIONAL TESTS:  5 times sit to stand: unable to complete Timed up and go (TUG): unable to complete  Bed Mobility: Occasional min A for sup <> sit, rolled B on mat with great effort, light min A  TRANSFERS: Scooting transfer with CGA, stand pivot with CGA, she reports that she sometimes has difficulty rising due to her knee pain  GAIT: Distance walked: 0' Assistive device utilized:  shopping cart-will bring her RW from home. Level of assistance: Min A Comments: Stood and weight shifted, then stood again and took steps to turn to W/C.  TODAY'S TREATMENT:                                                                                                                              DATE:  03/27/23 Scooting on mat to L, then to R 10 reps each way Seated long kicks, 4#, 2 x 10 each leg 5 x STS re-test-see goals Standing heel raises x 10 Standing alternating taps on 2" step with BUE support, 1 x 5, 1 x 10 reps Lateral step taps on 2" step, 10 reps each Ambulation 1 x 40' with RW and CGA,  including turning.   03/25/23 Seated long kicks, 2#, 2 x 10 reps each leg. Supine with legs elevated over physioball-bridge x 10, roll side to side x 10, isometric hip abd and add, 2 x 10  reps Performed bed mobility and squat pivot transfers with S. Fitter press, ULE 1 x 10 each leg, 2 blue bands Ambulation 2 x 20' with RW and CGA, stable.  03/18/23 Seated long kicks with 3# weights, 2 x 10 B. Supine with legs over physioball, bridge x 10 Attempted bridge with hip IR, but she developed a severe L HS cramp.  NuStep L5 x 2 minutes, then 2 x 30 sec fast pedal. Tried to increase resistance, but too painful in R knee. Seated rotational weight shifts, sliding hands out to the side while lifting opposite leg to surface of mat. 3 x 5 reps to each side. Improved stability noted.  03/13/23 Attempted to pedal bike, but it hurt her R knee NuStep-L5 x 4 min, then 3 x 30 sec fast pedal at L3 Ambulation with RW, CGA 1 x 20', then 1 x 40' including turn around. Standing stepping out and back in all directions, alternating legs, with BUE support on RW. Increased speed and efficiency with repetition. Standing step forward to pick up an object, then back and turn to place it on mat behind her. 4 reps each side. Difficulty performing in L stance without BUE support, but able to perform x 3 with UUE support in RLE SLS.  03/04/23 Supine LE and trunk stabilization with BLE over physioball-bridge, bridge with B hip IR, SLR off the ball while stabilizing with opposite leg, 10 each, did have some cramping in R hip adductors Supine rolling to L side lie with R arm and leg held in the air for trunk stability, 3 x each direction. Supine to side lie to sit from r and from L. Standing alternating taps on 4" step with BUE support on RW, CGA, 10 reps each Ambulation with RW, 1 x 25', including 180 degree turn. CGA Standing weight shifts with straight cane and CGA, able to shift and lift heels alternately.  02/27/23 NuStep L5 x 4 minutes, L ant/lat knee pain Fitter press blue and black 2 x 10 each direction Ambulated x 20' with RW and CGA Seated rotational slides- moving hands out to the side and back, 2 x 5  to each side. Added Leg extension to second set. Alternating step taps on 2" step with BUE support on RW B side stepping in front of mat with RW, CGA, 2 x 5 steps to each side. Ambulated x 20' with RW and CGA  PATIENT EDUCATION:  Education details: POC Person educated: Patient Education method: Explanation Education comprehension: verbalized understanding  HOME EXERCISE PROGRAM: [id number]  ASSESSMENT:  CLINICAL IMPRESSION: Patient arrives with continued BLE swelling. She has an [place] ordered, but has not been contacted to schedule. No warmth or pain in either LE, patient wanting to progress, so continued with LE strengthening in sit and in standing, able to perform each activity.  OBJECTIVE IMPAIRMENTS: Abnormal gait, decreased activity tolerance, decreased balance, decreased coordination, decreased endurance, difficulty walking, decreased ROM, decreased strength, and postural dysfunction.   ACTIVITY LIMITATIONS: carrying, lifting, bending, standing, squatting, stairs, transfers, and locomotion level  PARTICIPATION LIMITATIONS: meal prep, cleaning, laundry, driving, and shopping  PERSONAL FACTORS: Fitness, Past/current experiences, and 1 comorbidity: Recently diagnosed with GBS  are also affecting patient's functional outcome.   REHAB POTENTIAL: Good  CLINICAL DECISION MAKING: Evolving/moderate complexity  EVALUATION COMPLEXITY: Moderate   GOALS: Goals reviewed with patient? Yes  SHORT TERM GOALS: Target date: 11/28/22 I with initial HEP Baseline: Goal status: met 01/09/23  LONG TERM GOALS: Target date: 03/06/23  I with final HEP Baseline:  Goal status: 03/18/23, ongoing  2.  Decrease 5x STS to < 14 sec Baseline: unable to complete Goal status: 03/27/23-32 sec, ongoing  3.  Decrease TUG to < 20 sec Baseline: Unable to complete. Goal status: 02/18/23 1:55 Ongoing  4.  Patient will ambulate at least 300' with LRAD, MI, on level and unlevel surfaces. Baseline: Stand pivot  transfer with CGA Goal status: 02/18/23-20', RW, CGA, slow, but stable throughout. Ongoing  5.  Perform all bed mobility with I Baseline: Occ min A on mat Goal status: 02/18/23-met  6.  Increase BLE strength to at least 4/5 throughout. Baseline: (3-)-3/5 Goal status: 02/18/23-RLE 4-/5, LLE 3+/5, Ongoing  7. Patient and husband will safely perform car transfers with LRAD Baseline: Currently uses van transportation Goal Status: 02/25/23 met  PLAN:  PT FREQUENCY: 1-2x/week  PT DURATION: 12 weeks  PLANNED INTERVENTIONS: Therapeutic exercises, Therapeutic activity, Neuromuscular re-education, Balance training, Gait training, Patient/Family education, Self Care, Joint mobilization, Stair training, Dry Needling, Electrical stimulation, Cryotherapy, Moist heat, Ultrasound, Ionotophoresis 4mg /ml Dexamethasone, and Manual therapy  PLAN FOR NEXT SESSION: Initiate HEP  Oley Balm DPT

## 2023-03-27 NOTE — Telephone Encounter (Signed)
Orders faxed to [facility] for IVIG home infusions per Dr, Terrace Arabia with visit notes, recent labs, insurance info, medication list, EMG report    Please start home health IVIG for CIDP on this patient.   2g/kg divided into 4 days followed by 1g/kg every 3 weeks.

## 2023-04-01 ENCOUNTER — Ambulatory Visit: Payer: 59 | Admitting: Occupational Therapy

## 2023-04-01 ENCOUNTER — Encounter: Payer: Self-pay | Admitting: Physical Therapy

## 2023-04-01 ENCOUNTER — Ambulatory Visit: Payer: 59 | Admitting: Physical Therapy

## 2023-04-01 DIAGNOSIS — R2689 Other abnormalities of gait and mobility: Secondary | ICD-10-CM

## 2023-04-01 DIAGNOSIS — G610 Guillain-Barre syndrome: Secondary | ICD-10-CM

## 2023-04-01 DIAGNOSIS — R278 Other lack of coordination: Secondary | ICD-10-CM

## 2023-04-01 DIAGNOSIS — R2681 Unsteadiness on feet: Secondary | ICD-10-CM

## 2023-04-01 DIAGNOSIS — M6281 Muscle weakness (generalized): Secondary | ICD-10-CM

## 2023-04-01 DIAGNOSIS — R262 Difficulty in walking, not elsewhere classified: Secondary | ICD-10-CM

## 2023-04-01 NOTE — Therapy (Signed)
OUTPATIENT PHYSICAL THERAPY LOWER EXTREMITY TREATMENT   Patient Name: Dana [name] MRN: [id number] DOB:Jul 15, 1966, 57 y.o., female Today's Date: 04/01/2023  END OF SESSION:  PT End of Session - 04/01/23 1322     Visit Number 17    Date for PT Re-Evaluation 04/15/23    PT Start Time 1322    PT Stop Time 1420    PT Time Calculation (min) 58 min    Activity Tolerance Patient tolerated treatment well    Behavior During Therapy [facility] for tasks assessed/performed              Past Medical History:  Diagnosis Date   Anxiety    Back pain with radiation    Breast mass, right    Chronic pain    Chronic, continuous use of opioids    Complication of anesthesia 04/07/14   Allergic reaction to Lisinopril immediately following surgery   DJD (degenerative joint disease)    Fibromyalgia    Groin abscess    Headache(784.0)    History of IBS    Hypercholesterolemia    IBS (irritable bowel syndrome)    Lactose intolerance    Mild hypertension    Obesity    Tobacco use disorder    Umbilical hernia    Symptomatic   Past Surgical History:  Procedure Laterality Date   ABDOMINAL HYSTERECTOMY     ANTERIOR CERVICAL DECOMP/DISCECTOMY FUSION N/A 12/20/2015   Procedure: ANTERIOR CERVICAL DECOMPRESSION FUSION CERVICAL 4-5, CERVICAL 5-6, CERVICAL 6-7 WITH INSTRUMENTATION AND ALLOGRAFT;  Surgeon: Estill Bamberg, MD;  Location: [facility] OR;  Service: Orthopedics;  Laterality: N/A;  Anterior cervical decompression fusion, cervical 4-5, cervical 5-6, cervical 6-7 with instrumentation and allograft   BACK SURGERY     BILATERAL SALPINGECTOMY  09/03/2012   Procedure: BILATERAL SALPINGECTOMY;  Surgeon: Ok Edwards, MD;  Location: [facility]S;  Service: Gynecology;  Laterality: Bilateral;   BREAST BIOPSY Right 04/07/2014   Procedure: REMOVAL RIGHT BREAST MASS WITH WIRE LOCALIZATION;  Surgeon: Adolph Pollack, MD;  Location: [facility] OR;  Service: General;  Laterality: Right;   BREAST EXCISIONAL BIOPSY Left     x2   BREAST LUMPECTOMY     x2   CARPAL TUNNEL RELEASE Left 05/11/2019   Procedure: LEFT CARPAL TUNNEL RELEASE, RIGHT TENNIS ELBOW MARCAINE/DEPO MEDROL INJECTION UNDER ANESTHESIA;  Surgeon: Kerrin Champagne, MD;  Location: [facility] OR;  Service: Orthopedics;  Laterality: Left;   COLONOSCOPY W/ BIOPSIES  04/24/2012   per Dr. Leone Payor, clear, repeat in 10 yrs    disectomy     ESOPHAGOGASTRODUODENOSCOPY     FINGER SURGERY     Right index-excision of mass    FOOT SURGERY Right    Bone Spurs   IR FLUORO GUIDE CV LINE RIGHT  01/29/2023   IR REMOVAL TUN CV CATH W/O FL  02/08/2023   IR US GUIDE VASC ACCESS RIGHT  01/31/2023   LAPAROSCOPIC HYSTERECTOMY  09/03/2012   Procedure: HYSTERECTOMY TOTAL LAPAROSCOPIC;  Surgeon: Ok Edwards, MD;  Location: [facility];  Service: Gynecology;  Laterality: N/A;   LUMBAR DISC SURGERY     TUBAL LIGATION     UMBILICAL HERNIA REPAIR N/A 05/01/2018   Procedure: UMBILICAL HERNIA REPAIR;  Surgeon: Jimmye Norman, MD;  Location: [facility] OR;  Service: General;  Laterality: N/A;   Patient Active Problem List   Diagnosis Date Noted   CIDP (chronic inflammatory demyelinating polyneuropathy) (HCC) 01/28/2023   Gait abnormality 01/28/2023   Glaucoma 01/28/2023   Pain due to onychomycosis of  toenails of both feet 01/21/2023   Wheelchair dependence 12/30/2022   Nerve pain 12/30/2022   Insomnia due to medical condition 12/30/2022   Acute inflammatory demyelinating polyneuropathy (HCC) 10/01/2022   UTI (urinary tract infection) 09/24/2022   Hypomagnesemia 09/24/2022   Weakness 09/23/2022   Hypokalemia 09/23/2022   B12 deficiency 09/05/2022   Folate deficiency 09/05/2022   Carpal tunnel syndrome, left upper limb 05/11/2019    Class: Chronic   Spinal stenosis of lumbar region with neurogenic claudication 08/20/2018   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   Primary osteoarthritis of right knee 02/27/2017   Right tennis elbow 11/11/2016   Muscle cramps 01/15/2016   Bilateral leg  edema 01/08/2016   Radiculopathy 12/20/2015   Hyperglycemia 12/21/2014   Mastodynia, female 11/30/2014   S/P excision of fibroadenoma of breast 11/30/2014   Angioedema of lips 04/07/2014   Fibroadenoma of right breast 03/07/2014   Pelvic pain in female 06/19/2012   Menorrhagia 06/11/2012   SUI (stress urinary incontinence, female) 06/11/2012   HTN (hypertension) 06/11/2012   IBS (irritable bowel syndrome) - diarrhea predominant 03/17/2012   MICROSCOPIC HEMATURIA 11/30/2010   BREAST PAIN, RIGHT 11/30/2010   BREAST MASS, RIGHT 01/12/2010   HYPERCHOLESTEROLEMIA 12/07/2007   CIGARETTE SMOKER 12/07/2007   DEGENERATIVE JOINT DISEASE 12/07/2007   Low back pain with sciatica 12/07/2007   HEADACHE 12/07/2007   Obesity, Class III, BMI 40-49.9 (morbid obesity) (HCC) 08/03/2007   Anxiety state 08/03/2007    PCP: Nelwyn Salisbury, MD  REFERRING PROVIDER: Nelwyn Salisbury, MD  REFERRING DIAG: G61.0 (ICD-10-CM) - Guillain Barr syndrome [facility])   THERAPY DIAG:  Muscle weakness (generalized)  Other abnormalities of gait and mobility  Unsteadiness on feet  Guillain Barr syndrome (HCC)  Difficulty in walking, not elsewhere classified  Rationale for Evaluation and Treatment: Rehabilitation  ONSET DATE: 12/05/22  SUBJECTIVE:   SUBJECTIVE STATEMENT: Patient reports that she has been doing well, she reports stopped taking medication due to LE swelling (morphine).  Denies pain and or falls  OBJECTIVE STATEMENT: 01/28/23 ASSESSMENT AND PLAN   Dana [name] is a 57 y.o. female   Demyelinating polyradiculoneuropathy             Symptom onset monophasic since September 2024, failed to respond to IVIG in January 2024,             EMG nerve conduction study showed demyelinating features, no evidence of axonal loss,             Talk with neurohospitalist Dr. Iver Nestle, ED triage, will send her for hospital admission for plasma exchange, please add on following labs, immunofixative protein  electrophoresis, ANA with reflex, SSA, SSB titer, iron panel             Starting outpatient IVIG prior authorization process,  Dana [name] is a 57 y.o. female who [facility] ED on 09/23/2022 with bilateral lower extremity weakness with decreased mobility that has progressed to both arms. She also reported numbness and tingling of the extremities. She was transferred to [facility] for MRI. 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  Inpatient Rehab F/B HHPT.  R knee injection 10/08/22 trigger point injections for worsening back pain 2/15  RA  PAIN:  Are you having pain? Yes: NPRS scale: up to 10/10 Pain location: all over, but her R knee pain is worst,  Pain description: Sharp Aggravating factors: Has difficulty standing up Relieving factors: Medicine helps somewhat  PRECAUTIONS: Fall  WEIGHT BEARING RESTRICTIONS:  No  FALLS:  Has patient fallen in last 6 months? No  LIVING ENVIRONMENT: Lives with: lives with their family Lives in: House/apartment Stairs: Nohas a ramp Has following equipment at home: Environmental consultant - 2 wheeled, Wheelchair (power), Tour manager, bed side commode, Ramped entry, and [employer] lift  OCCUPATION: on disability due to RA and a fall several years ago  PLOF: Independent  PATIENT GOALS: walk  NEXT MD VISIT: She is scheduled to follow up with Dr. Serita Sheller with Physical Med/Rehab on 12-30-22. She is scheduled to follow up with Dr. Levert Feinstein at Neurology on 01-28-23.   OBJECTIVE:   DIAGNOSTIC FINDINGS:  Right knee radiographs 11/09/2020   FINDINGS: Severe patellofemoral joint space narrowing. Severe superior and mild inferior patellar degenerative osteophytosis. Moderate superior trochlear degenerative osteophytosis. No joint effusion. Severe medial compartment joint space narrowing with moderate peripheral medial and lateral compartment degenerative osteophytes.  COGNITION: Overall cognitive status: Within functional limits for  tasks assessed     SENSATION: Light touch: Impaired  and B hands and feet and lower legs  EDEMA:  BLE edema noted.  MUSCLE LENGTH: Hamstrings: WFL Thomas test: B tightness, to neutral  POSTURE: rounded shoulders and flexed trunk   PALPATION: No TTP  LOWER EXTREMITY ROM: BLE ROM limited in all planes an all joints due to body habitus   LOWER EXTREMITY MMT:  MMT Right eval Left eval  Hip flexion 3 3-  Hip extension 3- 3-  Hip abduction 3 3-  Hip adduction    Hip internal rotation    Hip external rotation    Knee flexion 4 4-  Knee extension 4- 4-  Ankle dorsiflexion 4- 4-  Ankle plantarflexion    Ankle inversion 4- 4-  Ankle eversion 4- 4-   (Blank rows = not tested)  FUNCTIONAL TESTS:  5 times sit to stand: unable to complete Timed up and go (TUG): unable to complete  Bed Mobility: Occasional min A for sup <> sit, rolled B on mat with great effort, light min A  TRANSFERS: Scooting transfer with CGA, stand pivot with CGA, she reports that she sometimes has difficulty rising due to her knee pain  GAIT: Distance walked: 0' Assistive device utilized:  shopping cart-will bring her RW from home. Level of assistance: Min A Comments: Stood and weight shifted, then stood again and took steps to turn to W/C.  TODAY'S TREATMENT:                                                                                                                              DATE:  04/01/23 Nustep level 5 x 8 minutes TUG 65 seconds with FWW and SBA Seated ball b/n knees squeeze Red tband left hip adduction to isolate Seated marches In [employer], hip abduction and extension, some right knee pain with right leg extension In pbars practicing big steps In pbars sides stepping Pball isometric abs Leg curls 20# 2x10 Leg extension small ROM  5# 2x10 Ambulate FWW CGA 2x 20'  03/27/23 Scooting on mat to L, then to R 10 reps each way Seated long kicks, 4#, 2 x 10 each leg 5 x STS  re-test-see goals Standing heel raises x 10 Standing alternating taps on 2" step with BUE support, 1 x 5, 1 x 10 reps Lateral step taps on 2" step, 10 reps each Ambulation 1 x 40' with RW and CGA, including turning.  03/25/23 Seated long kicks, 2#, 2 x 10 reps each leg. Supine with legs elevated over physioball-bridge x 10, roll side to side x 10, isometric hip abd and add, 2 x 10 reps Performed bed mobility and squat pivot transfers with S. Fitter press, ULE 1 x 10 each leg, 2 blue bands Ambulation 2 x 20' with RW and CGA, stable.  03/18/23 Seated long kicks with 3# weights, 2 x 10 B. Supine with legs over physioball, bridge x 10 Attempted bridge with hip IR, but she developed a severe L HS cramp.  NuStep L5 x 2 minutes, then 2 x 30 sec fast pedal. Tried to increase resistance, but too painful in R knee. Seated rotational weight shifts, sliding hands out to the side while lifting opposite leg to surface of mat. 3 x 5 reps to each side. Improved stability noted.  03/13/23 Attempted to pedal bike, but it hurt her R knee NuStep-L5 x 4 min, then 3 x 30 sec fast pedal at L3 Ambulation with RW, CGA 1 x 20', then 1 x 40' including turn around. Standing stepping out and back in all directions, alternating legs, with BUE support on RW. Increased speed and efficiency with repetition. Standing step forward to pick up an object, then back and turn to place it on mat behind her. 4 reps each side. Difficulty performing in L stance without BUE support, but able to perform x 3 with UUE support in RLE SLS.  PATIENT EDUCATION:  Education details: POC Person educated: Patient Education method: Explanation Education comprehension: verbalized understanding  HOME EXERCISE PROGRAM: [id number]  ASSESSMENT:  CLINICAL IMPRESSION: Patient stopped a medication that she was thinking caused LE swelling.  I had her do the TUG and she shaved 50 seconds off of her time from 5 weeks ago which is amazing.  She did  have some posterior right knee pain with hip extension.  I added some activities in the Pbars and the leg extension and leg curls, most difficult thing was getting on and off, requiring CGA and then min A with her legs to get on and off  OBJECTIVE IMPAIRMENTS: Abnormal gait, decreased activity tolerance, decreased balance, decreased coordination, decreased endurance, difficulty walking, decreased ROM, decreased strength, and postural dysfunction.   ACTIVITY LIMITATIONS: carrying, lifting, bending, standing, squatting, stairs, transfers, and locomotion level  PARTICIPATION LIMITATIONS: meal prep, cleaning, laundry, driving, and shopping  PERSONAL FACTORS: Fitness, Past/current experiences, and 1 comorbidity: Recently diagnosed with GBS  are also affecting patient's functional outcome.   REHAB POTENTIAL: Good  CLINICAL DECISION MAKING: Evolving/moderate complexity  EVALUATION COMPLEXITY: Moderate   GOALS: Goals reviewed with patient? Yes  SHORT TERM GOALS: Target date: 11/28/22 I with initial HEP Baseline: Goal status: met 01/09/23  LONG TERM GOALS: Target date: 03/06/23  I with final HEP Baseline:  Goal status: 03/18/23, ongoing  2.  Decrease 5x STS to < 14 sec Baseline: unable to complete Goal status: 03/27/23-32 sec, ongoing  3.  Decrease TUG to < 20 sec Baseline: Unable to complete. Goal status: 02/18/23 1:55 04/01/23 =  65 seconds Ongoing  4.  Patient will ambulate at least 300' with LRAD, MI, on level and unlevel surfaces. Baseline: Stand pivot transfer with CGA Goal status: 02/18/23-20', RW, CGA, slow, but stable throughout. Ongoing  5.  Perform all bed mobility with I Baseline: Occ min A on mat Goal status: 02/18/23-met  6.  Increase BLE strength to at least 4/5 throughout. Baseline: (3-)-3/5 Goal status: 02/18/23-RLE 4-/5, LLE 3+/5, Ongoing  7. Patient and husband will safely perform car transfers with LRAD Baseline: Currently uses van transportation Goal Status: 02/25/23  met  PLAN:  PT FREQUENCY: 1-2x/week  PT DURATION: 12 weeks  PLANNED INTERVENTIONS: Therapeutic exercises, Therapeutic activity, Neuromuscular re-education, Balance training, Gait training, Patient/Family education, Self Care, Joint mobilization, Stair training, Dry Needling, Electrical stimulation, Cryotherapy, Moist heat, Ultrasound, Ionotophoresis 4mg /ml Dexamethasone, and Manual therapy  PLAN FOR NEXT SESSION:continue to progress as she tolerates pushing her strength and function  Stacie Glaze, PT

## 2023-04-01 NOTE — Therapy (Signed)
OUTPATIENT OCCUPATIONAL THERAPY NEURO Treatment  Patient Name: Dana [name] MRN: [id number] DOB:[birth year], 57 y.o., female Today's Date: 04/01/2023  PCP: Dr. Clent Ridges REFERRING PROVIDER: Dr. Gershon Crane  END OF SESSION:  OT End of Session - 04/01/23 1515     Visit Number 18    Number of Visits 35    Date for OT Re-Evaluation 05/13/23    Authorization Type UHC, Medicare    Authorization - Visit Number 18    Progress Note Due on Visit 20    OT Start Time 1450    OT Stop Time 1530    OT Time Calculation (min) 40 min    Activity Tolerance Patient tolerated treatment well    Behavior During Therapy [facility] for tasks assessed/performed                     Past Medical History:  Diagnosis Date   Anxiety    Back pain with radiation    Breast mass, right    Chronic pain    Chronic, continuous use of opioids    Complication of anesthesia 04/07/14   Allergic reaction to Lisinopril immediately following surgery   DJD (degenerative joint disease)    Fibromyalgia    Groin abscess    Headache(784.0)    History of IBS    Hypercholesterolemia    IBS (irritable bowel syndrome)    Lactose intolerance    Mild hypertension    Obesity    Tobacco use disorder    Umbilical hernia    Symptomatic   Past Surgical History:  Procedure Laterality Date   ABDOMINAL HYSTERECTOMY     ANTERIOR CERVICAL DECOMP/DISCECTOMY FUSION N/A 12/20/2015   Procedure: ANTERIOR CERVICAL DECOMPRESSION FUSION CERVICAL 4-5, CERVICAL 5-6, CERVICAL 6-7 WITH INSTRUMENTATION AND ALLOGRAFT;  Surgeon: Estill Bamberg, MD;  Location: [facility] OR;  Service: Orthopedics;  Laterality: N/A;  Anterior cervical decompression fusion, cervical 4-5, cervical 5-6, cervical 6-7 with instrumentation and allograft   BACK SURGERY     BILATERAL SALPINGECTOMY  09/03/2012   Procedure: BILATERAL SALPINGECTOMY;  Surgeon: Ok Edwards, MD;  Location: [facility];  Service: Gynecology;  Laterality: Bilateral;   BREAST BIOPSY Right 04/07/2014    Procedure: REMOVAL RIGHT BREAST MASS WITH WIRE LOCALIZATION;  Surgeon: Adolph Pollack, MD;  Location: [facility] OR;  Service: General;  Laterality: Right;   BREAST EXCISIONAL BIOPSY Left    x2   BREAST LUMPECTOMY     x2   CARPAL TUNNEL RELEASE Left 05/11/2019   Procedure: LEFT CARPAL TUNNEL RELEASE, RIGHT TENNIS ELBOW MARCAINE/DEPO MEDROL INJECTION UNDER ANESTHESIA;  Surgeon: Kerrin Champagne, MD;  Location: [facility] OR;  Service: Orthopedics;  Laterality: Left;   COLONOSCOPY W/ BIOPSIES  04/24/2012   per Dr. Leone Payor, clear, repeat in 10 yrs    disectomy     ESOPHAGOGASTRODUODENOSCOPY     FINGER SURGERY     Right index-excision of mass    FOOT SURGERY Right    Bone Spurs   IR FLUORO GUIDE CV LINE RIGHT  01/29/2023   IR REMOVAL TUN CV CATH W/O FL  02/08/2023   IR US GUIDE VASC ACCESS RIGHT  01/31/2023   LAPAROSCOPIC HYSTERECTOMY  09/03/2012   Procedure: HYSTERECTOMY TOTAL LAPAROSCOPIC;  Surgeon: Ok Edwards, MD;  Location: [facility];  Service: Gynecology;  Laterality: N/A;   LUMBAR DISC SURGERY     TUBAL LIGATION     UMBILICAL HERNIA REPAIR N/A 05/01/2018   Procedure: UMBILICAL HERNIA REPAIR;  Surgeon: Jimmye Norman, MD;  Location: [facility] OR;  Service: General;  Laterality: N/A;   Patient Active Problem List   Diagnosis Date Noted   CIDP (chronic inflammatory demyelinating polyneuropathy) (HCC) 01/28/2023   Gait abnormality 01/28/2023   Glaucoma 01/28/2023   Pain due to onychomycosis of toenails of both feet 01/21/2023   Wheelchair dependence 12/30/2022   Nerve pain 12/30/2022   Insomnia due to medical condition 12/30/2022   Acute inflammatory demyelinating polyneuropathy (HCC) 10/01/2022   UTI (urinary tract infection) 09/24/2022   Hypomagnesemia 09/24/2022   Weakness 09/23/2022   Hypokalemia 09/23/2022   B12 deficiency 09/05/2022   Folate deficiency 09/05/2022   Carpal tunnel syndrome, left upper limb 05/11/2019    Class: Chronic   Spinal stenosis of lumbar region with neurogenic claudication  08/20/2018   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   Primary osteoarthritis of right knee 02/27/2017   Right tennis elbow 11/11/2016   Muscle cramps 01/15/2016   Bilateral leg edema 01/08/2016   Radiculopathy 12/20/2015   Hyperglycemia 12/21/2014   Mastodynia, female 11/30/2014   S/P excision of fibroadenoma of breast 11/30/2014   Angioedema of lips 04/07/2014   Fibroadenoma of right breast 03/07/2014   Pelvic pain in female 06/19/2012   Menorrhagia 06/11/2012   SUI (stress urinary incontinence, female) 06/11/2012   HTN (hypertension) 06/11/2012   IBS (irritable bowel syndrome) - diarrhea predominant 03/17/2012   MICROSCOPIC HEMATURIA 11/30/2010   BREAST PAIN, RIGHT 11/30/2010   BREAST MASS, RIGHT 01/12/2010   HYPERCHOLESTEROLEMIA 12/07/2007   CIGARETTE SMOKER 12/07/2007   DEGENERATIVE JOINT DISEASE 12/07/2007   Low back pain with sciatica 12/07/2007   HEADACHE 12/07/2007   Obesity, Class III, BMI 40-49.9 (morbid obesity) (HCC) 08/03/2007   Anxiety state 08/03/2007    ONSET DATE: 12/05/22- referral date  REFERRING DIAG: G61.0 (ICD-10-CM) - Guillain Barr syndrome (HCC)  THERAPY DIAG:    Rationale for Evaluation and Treatment: Rehabilitation  SUBJECTIVE:   SUBJECTIVE STATEMENT: Pt reports she worked hard with PT PERTINENT HISTORY: Pt is a 57 y/o female hospitalized 09/23/22  with progressive weakness and sensation changes, consistent with Guillain-Barr syndrome. MRI negative. LP results and assessment most consistent with Guillain-Barr syndrome.  Pt was d/c home from CIR 11/06/22 PMH includes: glaucoma anxiety, DJD, fibromyalgia, HTN, tobacco use, B-12 deficiency, OA, ACDF 2017.  Pt was re-hospitalized 01/28/2456 with CIDP (chronic inflammatory demyelinating polyneuropathy). Saw Neuro on day of admission, NCS showed demyelination, referred to hospital for PLEX. Initially diagnosed with an acute demyelinating neuropathy Jan 2024, treated with IVIG. PMH: Spinal stenosis,  HTN, Obesity   PRECAUTIONS: Fall  WEIGHT BEARING RESTRICTIONS: no  PAIN:  Are you having pain? Yes: 4/10 NPRS scale: mild pain Pain location: R knee Pain description: aching Aggravating factors: standing Relieving factors: rest    FALLS: Has patient fallen in last 6 months? No  LIVING ENVIRONMENT: Lives with: lives with their spouse Lives in: House/apartment Stairs: has ramp built Has following equipment at home:   BSC, steadi,  tub bench but can't access bathroom   PLOF: Independent  PATIENT GOALS: increase independence   OBJECTIVE:   HAND DOMINANCE: Right  ADLs: from initial eval Overall ADLs: assistance required, unable to access BR Transfers/ambulation related to ADLs: Eating: unable to cut food Grooming: set up UB Dressing: set up for bra and shirt LB Dressing: max A Toileting: uses BSC uses steadi Bathing: min-mod A in hospital  bed  Tub Shower transfers: n/a Equipment: Emergency planning/management officer  IADLs:dependent for IADLS   MOBILITY STATUS:  uses w/c  , per  PT report minguard for transfer from w/c to mat    ACTIVITY TOLERANCE: Activity tolerance: Pt fatigues quickly    UPPER EXTREMITY ROM:  see updates for 02/18/23 re-eval   Active ROM Right 02/18/23 Left 02/18/23  Shoulder flexion 130 120  Shoulder abduction 90 90  Shoulder adduction    Shoulder extension    Shoulder internal rotation    Shoulder external rotation    Elbow flexion    Elbow extension  -5  Wrist flexion    Wrist extension    Wrist ulnar deviation    Wrist radial deviation    Wrist pronation    Wrist supination    (Blank rows = not tested)  UPPER EXTREMITY MMT:   see below for re-eval 02/18/23  MMT Right 02/18/23 Left 02/18/23  Shoulder flexion 3+/5 3+/5  Shoulder abduction    Shoulder adduction    Shoulder extension    Shoulder internal rotation    Shoulder external rotation    Middle trapezius    Lower trapezius    Elbow flexion 4/5 4/5  Elbow extension 4-/5 4-/5  Wrist  flexion    Wrist extension    Wrist ulnar deviation    Wrist radial deviation    Wrist pronation    Wrist supination      HAND FUNCTION: Grip strength: Right: 32 lbs; Left: 18 lbs, 02/18/23 R 55 lbs, L 35 lbs  COORDINATION: 9 Hole Peg test:initial eval  Right: 1 min 48 sec; Left: 4 pegs in 2 mins  Box and Blocks:  Right 35 blocks, Left  26 blocks 02/18/23- RUE 35.46, LUE 65 secs  SENSATION: Light touch: WFL Hot/Cold: Not tested    COGNITION: Overall cognitive status:NT   VISION: Subjective report: reports hx of glaucoma VISION ASSESSMENT: Not tested  OBSERVATIONS: Pt is eager to improve, pt's husband is very supportive   TODAY'S TREATMENT:                                                                                                                              DATE:04/01/23- Seated closed chain shoulder flexion, with unweighted ball followed by shoulder flexion then trunk rotation and chest press wih 2.2 lbs weighted ball. Rest in between sets seated on mat. Pt walker to mat with walker and supervision. Standing at countertop functional reaching to place graded clothespins in targets with RUE, rest break then pt removed with left hand, close supervision. Discussed plans to continue OT after next week. Pt walked mat to w/c with walker and close supervision .   03/27/23-Theraband exercises for biceps curls, triceps, rowing  and shoulder extension with green band and shoulder abduction with red band 10-15 reps each, min v.c, rest break in between sets Standing at countertop to place and remove items from lowest overhead shelf, seated rest break prior to replacing, close supervision for moblity with walker  Gripper set at level 2 sustained grip to pick up 1 inch blocks, 1/2 with  RUE with min difficulty and 1/2 with left with min-mod difficulty for sustained grip Graded clothespins for sustained pinch 1-8# using left and right UE's for sustained pinch, RUE only with black clothespins  dur to difficulty. Therapist recommends that pt orders a long handled sponge to apply lotion onto legs. Pt with continued bilateral LE swelling.  03/25/23 Pt is hoarse today and reports a tickling in her throat as well as LE swelling. Therapist recommends pt calls MD.Pt arrived late as her transportation did not pick her up. Arm bike x 6 mins level 1 for conditioning.Pt transferred to and from arm bike to w/c with min A. Copying small peg design for increased fine motor coordination with LUE, min difficulty and increased time, removing with in hand manipulation. Pt demonstrates improved fine motor coordination.  03/18/23- Pt amb across gym with RW and minguard to arm bike.  Arm bike x 7 mins level 1  for conditioning Placing small pegs into pegboard with LUE for increased fine motor coordination, pt with improved performance and coordination today. Pt walked 3 ft from chair to counter and stood to place and remove items from overhead shelves with 1 seated rest break. Discussion with patient regarding her home situation,. Pt reports frustration and she feels like she is a burden on husband. Therapist recommends pt seeks counseling. Shoulder abduction  with red band biceps curls with green band  20 reps for UE strengthening   03/13/23-Pt reports she had a stomach bug and she wasn't feeling well. Placing large pegs into pegboard with LUE with occasional min difficulty, then removing with 3 pt pinch min v.c to avoid shoulder hike. Red theraband exercises for shoulder abduction, rowing and extension 15 reps each,  green band for biceps curls and triceps extension 15 reps each, min v.c Arm bike x 6 mins level 3 for conditioning, pt transfered to and from with min A Discussed safety for cooking and recommendation that if pt decides to fry fish, she performs with someone for safety, and she keeps her LUE away from hot surfaces due to sensory deficits. Pt verbalized understanding.   03/04/23 Red theraband  exercises for shoulder abduction, rowing and extension 20 reps each, upgraded to green band for biceps curls and triceps extension 20 reps each, min v.c Arm bike x 8 mins, 4 mins each direction, level 2 for conditioning, pt transferred to and from seat with supervision. Coordination activities with LUE: flipping and dealing cards, min difficulty/ v.c  02/27/23-Arm bike x 6 mins level 2 for conditioning (Pt transfers to and from w/c with supervision) Placing large pegs into pegboard with LUE then removing for increased fine motor coordination, min difficulty/ v.c  Standing at countertop with supervision to place and remove items with right then left UE's, pt stood x 3 for 30 secs to 1 min 15 secs, rest breaks in between Gripper set at level 1 to pick up 1 inch blocks  with right and left UE's for sustained grip, min difficulty Pt reports amb to BR with walker without assist, therapist recommends supervision for safety.  02/25/23-Arm bike x 6 mins level 2 for conditioning, Pt transferred to the seat for the first time with supervision Reviewed previously issued red theraband exercises for UE strength 20 reps each, min v.c  Fine motor coordination exercises with left and right UE's, flipping playing cards and stacking coins with min difficulty using RUE, picking up coins and placing in container with LUE and flipping cards mod difficulty. Pt attempted placing small  pegs into a pegboard with bilateral UE's max difficulty with LUE, mod difficulty with RUE, task was discontinued. Pt reports being a little off. She took her meds without eating and got into a disagreement with transportation this a.m.    02/18/23- re-eval performed and therapist discussed potential goals with patient.  PATIENT EDUCATION: Education details:   see above Person educated: Patient Education method: explanation, demonstration Education comprehension: verbalized understanding, returned demonstration.  HOME EXERCISE PROGRAM:   12/17/22- coordination HEP, cane exercises seated   GOALS: Goals reviewed with patient? Yes  SHORT TERM GOALS: Target date: 03/20/23  I with initial HEP Baseline: Goal status: met 01/21/23  2.  Pt will demonstrate improved UE functional use as evidenced increasing bilateral box/ blocks score by 4 blocks.  Baseline: R 35, L 26 Goal status  met 12/31/22 RUE :43:  LUE 35    3.  Pt will donn pants with mod A. Baseline: max A Goal status:met  02/18/23- pt performed mod I today  4. Pt will verbalize understanding of adapted strategies to maximize safety and I with ADLs/ IADLs .  Goal status:  ongoing, 02/18/23  5.  Pt will demonstrate ability to stand for functional activity with min A x 10 mins in prep for ADLs.  Goal status: not consistent, pt stood for 1 min on 02/18/23  6.  Pt will perform w/c to [facility] transfers mod I  Goal status:  met, 02/18/23 7. I with updated HEP  Goal status- ongoing 03/18/23     LONG TERM GOALS: Target date: 05/13/23 I with updated HEP  Goal status: ongoing, 02/18/23  2.  Pt will perform LB dressing mod I. Baseline:max A Goal status:met 03/25/23  3.  Pt will bathe with supervision/ set up. Baseline: min-mod A for sponge bath Goal status: met for sponge bathing at set up   4.  Pt will perform basic cooking at a w/c level mod I  Revised goal: Pt will perform basic cooking modified independently at a walker level.  Goal status:  met, 02/18/23  pt has performed simple tasks mod I w/c level, goal revised - Ongoing, pt is currently performing at a w/c level-03/25/23  5.  Pt will demonstrate improved fine motor coordination as evidenced by performing LUE 9 hole peg test in 2 mins or less.  Revised goal:Pt will demonstrate improved fine motor coordination as evidenced by performing 9 hole peg test in 55 secs or less  Goal status: initial goal met 6/4/246/4/24-  LUE 65 secs , goal revised  6.  Pt will demonstrate improved fine motor coordination as evidenced by  performing RUE 9 hole peg test in 1 mins  30 secs or less.  Baseline: RUE  1 min 48 secs, LUE 4 pegs placed in 2 mins  Goal status: met 6/4/246/4/24- RUE 35.46,     7. Pt will perform shower with supervision using tub transfer bench    Goal status: ongoing min A 03/25/23   8. Pt will perform basic home management at a walker level mod I    Goal status: ongoing, 03/25/23 ASSESSMENT:  CLINICAL IMPRESSION: Pt is progressing towards goals.Pt continues to demonstrate improving UE strength and standing tolerance. PERFORMANCE DEFICITS: in functional skills including ADLs, IADLs, coordination, dexterity, sensation, ROM, strength, flexibility, Fine motor control, Gross motor control, mobility, balance, endurance, decreased knowledge of precautions, decreased knowledge of use of DME, and UE functional use, cognitive skills including  and psychosocial skills including coping strategies, environmental adaptation, habits, interpersonal interactions, and  routines and behaviors.   IMPAIRMENTS: are limiting patient from ADLs, IADLs, rest and sleep, play, leisure, and social participation.   CO-MORBIDITIES: may have co-morbidities  that affects occupational performance. Patient will benefit from skilled OT to address above impairments and improve overall function.  MODIFICATION OR ASSISTANCE TO COMPLETE EVALUATION: No modification of tasks or assist necessary to complete an evaluation.  OT OCCUPATIONAL PROFILE AND HISTORY: Detailed assessment: Review of records and additional review of physical, cognitive, psychosocial history related to current functional performance.  CLINICAL DECISION MAKING: LOW - limited treatment options, no task modification necessary  REHAB POTENTIAL: Good  EVALUATION COMPLEXITY: Low    PLAN:  OT FREQUENCY: 2x/week  OT DURATION: 12 weeks  PLANNED INTERVENTIONS: self care/ADL training, therapeutic exercise, therapeutic activity, neuromuscular re-education, manual therapy,  passive range of motion, gait training, balance training, stair training, functional mobility training, aquatic therapy, ultrasound, paraffin, moist heat, cryotherapy, patient/family education, energy conservation, coping strategies training, DME and/or AE instructions, and Re-evaluation  RECOMMENDED OTHER SERVICES: PT  CONSULTED AND AGREED WITH PLAN OF CARE: Patient and family member/caregiver  PLAN FOR NEXT SESSION: standing tolerance, transfers, UE strength and coordination, ADL strategies  Raynald Rouillard, OT 04/01/2023, 4:28 PM

## 2023-04-02 ENCOUNTER — Telehealth: Payer: Self-pay | Admitting: Family Medicine

## 2023-04-02 NOTE — Telephone Encounter (Signed)
Please find out what she means by "allergic"

## 2023-04-02 NOTE — Telephone Encounter (Signed)
I understand. I suppose the only thing to do now is go back on Hydromorphone. She should call around to the pharmacies in the area to see if either the 4 mg or 8 mg size is available.

## 2023-04-02 NOTE — Telephone Encounter (Signed)
Spoke with pt states that Morphine is making her feet swell bad, pt states that she stopped taking medications 5 days ago and the swelling has gone down, wants Dr Clent Ridges to change this prescription to something without side effects. Please advise

## 2023-04-02 NOTE — Telephone Encounter (Signed)
Pt is calling and per pt she is allergic to morphine (MS CONTIN) 60 MG 12 hr tablet  and has  stop taking medication and would like something else  [id number] - [place], [place] - [address] Phone: [phone]  Fax: [phone]

## 2023-04-03 ENCOUNTER — Ambulatory Visit: Payer: 59 | Admitting: Physical Therapy

## 2023-04-03 MED ORDER — OXYCODONE HCL 20 MG PO TABS: 20.0000 mg | ORAL_TABLET | Freq: Four times a day (QID) | ORAL | 0 refills | Status: DC | PRN

## 2023-04-03 NOTE — Telephone Encounter (Signed)
Spoke with pt stated that she can take Oxycodone. Pt states that she called around different pharmacies and none has Hydromorphone in stock. New Rx to go to [facility] [address]

## 2023-04-03 NOTE — Telephone Encounter (Signed)
Okay. I sent in for Oxycodone 20 mg every 6 hours as needed to try for one month

## 2023-04-03 NOTE — Telephone Encounter (Signed)
Pt notified to pick up Rx from her pharmacy

## 2023-04-04 ENCOUNTER — Telehealth: Payer: Self-pay

## 2023-04-04 NOTE — Telephone Encounter (Signed)
Orders faxed from Dr. Terrace Arabia to home infusion.

## 2023-04-07 ENCOUNTER — Telehealth: Payer: Self-pay | Admitting: Family Medicine

## 2023-04-07 NOTE — Telephone Encounter (Signed)
Says the Oxycodone HCl 20 MG TABS is making her itch and she is requesting another medication for pain.

## 2023-04-08 ENCOUNTER — Ambulatory Visit: Payer: 59 | Admitting: Physical Therapy

## 2023-04-08 ENCOUNTER — Encounter: Payer: Self-pay | Admitting: Occupational Therapy

## 2023-04-08 ENCOUNTER — Encounter: Payer: Self-pay | Admitting: Physical Therapy

## 2023-04-08 ENCOUNTER — Ambulatory Visit: Payer: 59 | Admitting: Occupational Therapy

## 2023-04-08 DIAGNOSIS — G6181 Chronic inflammatory demyelinating polyneuritis: Secondary | ICD-10-CM

## 2023-04-08 DIAGNOSIS — M6281 Muscle weakness (generalized): Secondary | ICD-10-CM

## 2023-04-08 DIAGNOSIS — R278 Other lack of coordination: Secondary | ICD-10-CM

## 2023-04-08 DIAGNOSIS — R2689 Other abnormalities of gait and mobility: Secondary | ICD-10-CM

## 2023-04-08 DIAGNOSIS — R2681 Unsteadiness on feet: Secondary | ICD-10-CM

## 2023-04-08 MED ORDER — FENTANYL 25 MCG/HR TD PT72: 1.0000 | MEDICATED_PATCH | TRANSDERMAL | 0 refills | Status: DC

## 2023-04-08 NOTE — Telephone Encounter (Signed)
Spoke with the patient and informed her of the message below.  Patient stated she already tried taking half with no relief from itching, also used Benadryl with no relief.  Message sent to PCP.

## 2023-04-08 NOTE — Telephone Encounter (Signed)
Tell to cut the Oxycodone tablets in half, so she can take 10 mg at a time instead of 20

## 2023-04-08 NOTE — Therapy (Signed)
OUTPATIENT PHYSICAL THERAPY LOWER EXTREMITY TREATMENT   Patient Name: HIMANI CORONA MRN: [id number] DOB:1966/02/17, 57 y.o., female Today's Date: 04/08/2023  END OF SESSION:  PT End of Session - 04/08/23 1023     Visit Number 18    Date for PT Re-Evaluation 04/15/23    PT Start Time 1018    PT Stop Time 1058    PT Time Calculation (min) 40 min    Equipment Utilized During Treatment Gait belt    Activity Tolerance Patient tolerated treatment well    Behavior During Therapy WFL for tasks assessed/performed               Past Medical History:  Diagnosis Date   Anxiety    Back pain with radiation    Breast mass, right    Chronic pain    Chronic, continuous use of opioids    Complication of anesthesia 04/07/14   Allergic reaction to Lisinopril immediately following surgery   DJD (degenerative joint disease)    Fibromyalgia    Groin abscess    Headache(784.0)    History of IBS    Hypercholesterolemia    IBS (irritable bowel syndrome)    Lactose intolerance    Mild hypertension    Obesity    Tobacco use disorder    Umbilical hernia    Symptomatic   Past Surgical History:  Procedure Laterality Date   ABDOMINAL HYSTERECTOMY     ANTERIOR CERVICAL DECOMP/DISCECTOMY FUSION N/A 12/20/2015   Procedure: ANTERIOR CERVICAL DECOMPRESSION FUSION CERVICAL 4-5, CERVICAL 5-6, CERVICAL 6-7 WITH INSTRUMENTATION AND ALLOGRAFT;  Surgeon: Estill Bamberg, MD;  Location: [facility] OR;  Service: Orthopedics;  Laterality: N/A;  Anterior cervical decompression fusion, cervical 4-5, cervical 5-6, cervical 6-7 with instrumentation and allograft   BACK SURGERY     BILATERAL SALPINGECTOMY  09/03/2012   Procedure: BILATERAL SALPINGECTOMY;  Surgeon: Ok Edwards, MD;  Location: [facility] ORS;  Service: Gynecology;  Laterality: Bilateral;   BREAST BIOPSY Right 04/07/2014   Procedure: REMOVAL RIGHT BREAST MASS WITH WIRE LOCALIZATION;  Surgeon: Adolph Pollack, MD;  Location: [facility] OR;  Service: General;   Laterality: Right;   BREAST EXCISIONAL BIOPSY Left    x2   BREAST LUMPECTOMY     x2   CARPAL TUNNEL RELEASE Left 05/11/2019   Procedure: LEFT CARPAL TUNNEL RELEASE, RIGHT TENNIS ELBOW MARCAINE/DEPO MEDROL INJECTION UNDER ANESTHESIA;  Surgeon: Kerrin Champagne, MD;  Location: [facility] OR;  Service: Orthopedics;  Laterality: Left;   COLONOSCOPY W/ BIOPSIES  04/24/2012   per Dr. Leone Payor, clear, repeat in 10 yrs    disectomy     ESOPHAGOGASTRODUODENOSCOPY     FINGER SURGERY     Right index-excision of mass    FOOT SURGERY Right    Bone Spurs   IR FLUORO GUIDE CV LINE RIGHT  01/29/2023   IR REMOVAL TUN CV CATH W/O FL  02/08/2023   IR US GUIDE VASC ACCESS RIGHT  01/31/2023   LAPAROSCOPIC HYSTERECTOMY  09/03/2012   Procedure: HYSTERECTOMY TOTAL LAPAROSCOPIC;  Surgeon: Ok Edwards, MD;  Location: [facility];  Service: Gynecology;  Laterality: N/A;   LUMBAR DISC SURGERY     TUBAL LIGATION     UMBILICAL HERNIA REPAIR N/A 05/01/2018   Procedure: UMBILICAL HERNIA REPAIR;  Surgeon: Jimmye Norman, MD;  Location: [facility] OR;  Service: General;  Laterality: N/A;   Patient Active Problem List   Diagnosis Date Noted   CIDP (chronic inflammatory demyelinating polyneuropathy) (HCC) 01/28/2023   Gait abnormality 01/28/2023  Glaucoma 01/28/2023   Pain due to onychomycosis of toenails of both feet 01/21/2023   Wheelchair dependence 12/30/2022   Nerve pain 12/30/2022   Insomnia due to medical condition 12/30/2022   Acute inflammatory demyelinating polyneuropathy (HCC) 10/01/2022   UTI (urinary tract infection) 09/24/2022   Hypomagnesemia 09/24/2022   Weakness 09/23/2022   Hypokalemia 09/23/2022   B12 deficiency 09/05/2022   Folate deficiency 09/05/2022   Carpal tunnel syndrome, left upper limb 05/11/2019    Class: Chronic   Spinal stenosis of lumbar region with neurogenic claudication 08/20/2018   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   Primary osteoarthritis of right knee 02/27/2017   Right tennis elbow  11/11/2016   Muscle cramps 01/15/2016   Bilateral leg edema 01/08/2016   Radiculopathy 12/20/2015   Hyperglycemia 12/21/2014   Mastodynia, female 11/30/2014   S/P excision of fibroadenoma of breast 11/30/2014   Angioedema of lips 04/07/2014   Fibroadenoma of right breast 03/07/2014   Pelvic pain in female 06/19/2012   Menorrhagia 06/11/2012   SUI (stress urinary incontinence, female) 06/11/2012   HTN (hypertension) 06/11/2012   IBS (irritable bowel syndrome) - diarrhea predominant 03/17/2012   MICROSCOPIC HEMATURIA 11/30/2010   BREAST PAIN, RIGHT 11/30/2010   BREAST MASS, RIGHT 01/12/2010   HYPERCHOLESTEROLEMIA 12/07/2007   CIGARETTE SMOKER 12/07/2007   DEGENERATIVE JOINT DISEASE 12/07/2007   Low back pain with sciatica 12/07/2007   HEADACHE 12/07/2007   Obesity, Class III, BMI 40-49.9 (morbid obesity) (HCC) 08/03/2007   Anxiety state 08/03/2007    PCP: Nelwyn Salisbury, MD  REFERRING PROVIDER: Nelwyn Salisbury, MD  REFERRING DIAG: G61.0 (ICD-10-CM) - Guillain Barr syndrome [facility])   THERAPY DIAG:  Muscle weakness (generalized)  Other lack of coordination  Other abnormalities of gait and mobility  CIDP (chronic inflammatory demyelinating polyneuropathy) (HCC)  Rationale for Evaluation and Treatment: Rehabilitation  ONSET DATE: 12/05/22  SUBJECTIVE:   SUBJECTIVE STATEMENT: Patient reports that she is allergic to the pain medication they tried, so her knee pain is increased. Starts IVIG therapy on Thursday.  OBJECTIVE STATEMENT: 01/28/23 ASSESSMENT AND PLAN   VEENA STURGESS is a 57 y.o. female   Demyelinating polyradiculoneuropathy             Symptom onset monophasic since September 2024, failed to respond to IVIG in January 2024,             EMG nerve conduction study showed demyelinating features, no evidence of axonal loss,             Talk with neurohospitalist Dr. Iver Nestle, ED triage, will send her for hospital admission for plasma exchange, please add on  following labs, immunofixative protein electrophoresis, ANA with reflex, SSA, SSB titer, iron panel             Starting outpatient IVIG prior authorization process,  BROOKLEE MICHELIN is a 57 y.o. female who [facility] ED on 09/23/2022 with bilateral lower extremity weakness with decreased mobility that has progressed to both arms. She also reported numbness and tingling of the extremities. She was transferred to [facility] for MRI. 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  Inpatient Rehab F/B HHPT.  R knee injection 10/08/22 trigger point injections for worsening back pain 2/15  RA  PAIN:  Are you having pain? Yes: NPRS scale: up to 10/10 Pain location: all over, but her R knee pain is worst,  Pain description: Sharp Aggravating factors: Has difficulty standing up Relieving factors: Medicine helps somewhat  PRECAUTIONS: Fall  WEIGHT BEARING RESTRICTIONS: No  FALLS:  Has patient fallen in last 6 months? No  LIVING ENVIRONMENT: Lives with: lives with their family Lives in: House/apartment Stairs: Nohas a ramp Has following equipment at home: Environmental consultant - 2 wheeled, Wheelchair (power), Tour manager, bed side commode, Ramped entry, and [employer] lift  OCCUPATION: on disability due to RA and a fall several years ago  PLOF: Independent  PATIENT GOALS: walk  NEXT MD VISIT: She is scheduled to follow up with Dr. Serita Sheller with Physical Med/Rehab on 12-30-22. She is scheduled to follow up with Dr. Levert Feinstein at Neurology on 01-28-23.   OBJECTIVE:   DIAGNOSTIC FINDINGS:  Right knee radiographs 11/09/2020   FINDINGS: Severe patellofemoral joint space narrowing. Severe superior and mild inferior patellar degenerative osteophytosis. Moderate superior trochlear degenerative osteophytosis. No joint effusion. Severe medial compartment joint space narrowing with moderate peripheral medial and lateral compartment degenerative osteophytes.  COGNITION: Overall  cognitive status: Within functional limits for tasks assessed     SENSATION: Light touch: Impaired  and B hands and feet and lower legs  EDEMA:  BLE edema noted.  MUSCLE LENGTH: Hamstrings: WFL Thomas test: B tightness, to neutral  POSTURE: rounded shoulders and flexed trunk   PALPATION: No TTP  LOWER EXTREMITY ROM: BLE ROM limited in all planes an all joints due to body habitus   LOWER EXTREMITY MMT:  MMT Right eval Left eval  Hip flexion 3 3-  Hip extension 3- 3-  Hip abduction 3 3-  Hip adduction    Hip internal rotation    Hip external rotation    Knee flexion 4 4-  Knee extension 4- 4-  Ankle dorsiflexion 4- 4-  Ankle plantarflexion    Ankle inversion 4- 4-  Ankle eversion 4- 4-   (Blank rows = not tested)  FUNCTIONAL TESTS:  5 times sit to stand: unable to complete Timed up and go (TUG): unable to complete  Bed Mobility: Occasional min A for sup <> sit, rolled B on mat with great effort, light min A  TRANSFERS: Scooting transfer with CGA, stand pivot with CGA, she reports that she sometimes has difficulty rising due to her knee pain  GAIT: Distance walked: 0' Assistive device utilized:  shopping cart-will bring her RW from home. Level of assistance: Min A Comments: Stood and weight shifted, then stood again and took steps to turn to W/C.  TODAY'S TREATMENT:                                                                                                                              DATE:  04/08/23 NuStep L3 x 6 minutes Standing on Airex in parallel bars, performed B and ant/post weight shifts over feet, hands hovering over the bar. Stable. B side stepping in parallel bars with Red Tband positioned just above knees to add resistance to hip abd. 2 x 5 each way. Seated W/C push ups x 8 Seated reaching to each  side, outside BOS x 5 each.  04/01/23 Nustep level 5 x 8 minutes TUG 65 seconds with FWW and SBA Seated ball b/n knees squeeze Red tband left hip  adduction to isolate Seated marches In [employer], hip abduction and extension, some right knee pain with right leg extension In pbars practicing big steps In pbars sides stepping Pball isometric abs Leg curls 20# 2x10 Leg extension small ROM 5# 2x10 Ambulate FWW CGA 2x 20'  03/27/23 Scooting on mat to L, then to R 10 reps each way Seated long kicks, 4#, 2 x 10 each leg 5 x STS re-test-see goals Standing heel raises x 10 Standing alternating taps on 2" step with BUE support, 1 x 5, 1 x 10 reps Lateral step taps on 2" step, 10 reps each Ambulation 1 x 40' with RW and CGA, including turning.  03/25/23 Seated long kicks, 2#, 2 x 10 reps each leg. Supine with legs elevated over physioball-bridge x 10, roll side to side x 10, isometric hip abd and add, 2 x 10 reps Performed bed mobility and squat pivot transfers with S. Fitter press, ULE 1 x 10 each leg, 2 blue bands Ambulation 2 x 20' with RW and CGA, stable.  03/18/23 Seated long kicks with 3# weights, 2 x 10 B. Supine with legs over physioball, bridge x 10 Attempted bridge with hip IR, but she developed a severe L HS cramp.  NuStep L5 x 2 minutes, then 2 x 30 sec fast pedal. Tried to increase resistance, but too painful in R knee. Seated rotational weight shifts, sliding hands out to the side while lifting opposite leg to surface of mat. 3 x 5 reps to each side. Improved stability noted.  03/13/23 Attempted to pedal bike, but it hurt her R knee NuStep-L5 x 4 min, then 3 x 30 sec fast pedal at L3 Ambulation with RW, CGA 1 x 20', then 1 x 40' including turn around. Standing stepping out and back in all directions, alternating legs, with BUE support on RW. Increased speed and efficiency with repetition. Standing step forward to pick up an object, then back and turn to place it on mat behind her. 4 reps each side. Difficulty performing in L stance without BUE support, but able to perform x 3 with UUE support in RLE SLS.  PATIENT  EDUCATION:  Education details: POC Person educated: Patient Education method: Explanation Education comprehension: verbalized understanding  HOME EXERCISE PROGRAM: [id number]  ASSESSMENT:  CLINICAL IMPRESSION: Patient reports that she was allergic to the new pain meds and they are trying to figure out what to use. R knee pain increased today. Starts her IVIG on Thursday. Treatmetn balanced LE strength with guarding against oo much knee pain. Improved trunk strength and stability noted.  OBJECTIVE IMPAIRMENTS: Abnormal gait, decreased activity tolerance, decreased balance, decreased coordination, decreased endurance, difficulty walking, decreased ROM, decreased strength, and postural dysfunction.   ACTIVITY LIMITATIONS: carrying, lifting, bending, standing, squatting, stairs, transfers, and locomotion level  PARTICIPATION LIMITATIONS: meal prep, cleaning, laundry, driving, and shopping  PERSONAL FACTORS: Fitness, Past/current experiences, and 1 comorbidity: Recently diagnosed with GBS  are also affecting patient's functional outcome.   REHAB POTENTIAL: Good  CLINICAL DECISION MAKING: Evolving/moderate complexity  EVALUATION COMPLEXITY: Moderate   GOALS: Goals reviewed with patient? Yes  SHORT TERM GOALS: Target date: 11/28/22 I with initial HEP Baseline: Goal status: met 01/09/23  LONG TERM GOALS: Target date: 03/06/23  I with final HEP Baseline:  Goal status: 03/18/23, ongoing  2.  Decrease 5x STS to < 14 sec Baseline: unable to complete Goal status: 03/27/23-32 sec, ongoing  3.  Decrease TUG to < 20 sec Baseline: Unable to complete. Goal status: 02/18/23 1:55 04/01/23 = 65 seconds Ongoing  4.  Patient will ambulate at least 300' with LRAD, MI, on level and unlevel surfaces. Baseline: Stand pivot transfer with CGA Goal status: 02/18/23-20', RW, CGA, slow, but stable throughout. Ongoing  5.  Perform all bed mobility with I Baseline: Occ min A on mat Goal status:  02/18/23-met  6.  Increase BLE strength to at least 4/5 throughout. Baseline: (3-)-3/5 Goal status: 02/18/23-RLE 4-/5, LLE 3+/5, Ongoing  7. Patient and husband will safely perform car transfers with LRAD Baseline: Currently uses van transportation Goal Status: 02/25/23 met  PLAN:  PT FREQUENCY: 1-2x/week  PT DURATION: 12 weeks  PLANNED INTERVENTIONS: Therapeutic exercises, Therapeutic activity, Neuromuscular re-education, Balance training, Gait training, Patient/Family education, Self Care, Joint mobilization, Stair training, Dry Needling, Electrical stimulation, Cryotherapy, Moist heat, Ultrasound, Ionotophoresis 4mg /ml Dexamethasone, and Manual therapy  PLAN FOR NEXT SESSION:continue to progress as she tolerates pushing her strength and function  Oley Balm DPT 04/08/23 10:57 AM

## 2023-04-08 NOTE — Telephone Encounter (Signed)
She cannot take hydrocodone, or oxycodone, or morphine sulfate due to reactions. She says hydromorphone is not available. The only other pain medication I can think of for her to try is Duragesic patches (fentanyl). I will send in for her to try Duragesic patches for a few weeks.

## 2023-04-08 NOTE — Therapy (Signed)
OUTPATIENT OCCUPATIONAL THERAPY NEURO Treatment  Patient Name: Dana [name] MRN: [id number] DOB:20-Aug-1966, 57 y.o., female Today's Date: 04/08/2023  PCP: Dr. Clent Ridges REFERRING PROVIDER: Dr. Gershon Crane  END OF SESSION:  OT End of Session - 04/08/23 0958     Visit Number 19    Number of Visits 35    Date for OT Re-Evaluation 05/13/23    Authorization Type UHC, Medicare    Authorization - Visit Number 19    Progress Note Due on Visit 20    OT Start Time [phone]    OT Stop Time 1015    OT Time Calculation (min) 42 min    Activity Tolerance Patient tolerated treatment well    Behavior During Therapy [facility] for tasks assessed/performed                     Past Medical History:  Diagnosis Date   Anxiety    Back pain with radiation    Breast mass, right    Chronic pain    Chronic, continuous use of opioids    Complication of anesthesia 04/07/14   Allergic reaction to Lisinopril immediately following surgery   DJD (degenerative joint disease)    Fibromyalgia    Groin abscess    Headache(784.0)    History of IBS    Hypercholesterolemia    IBS (irritable bowel syndrome)    Lactose intolerance    Mild hypertension    Obesity    Tobacco use disorder    Umbilical hernia    Symptomatic   Past Surgical History:  Procedure Laterality Date   ABDOMINAL HYSTERECTOMY     ANTERIOR CERVICAL DECOMP/DISCECTOMY FUSION N/A 12/20/2015   Procedure: ANTERIOR CERVICAL DECOMPRESSION FUSION CERVICAL 4-5, CERVICAL 5-6, CERVICAL 6-7 WITH INSTRUMENTATION AND ALLOGRAFT;  Surgeon: Estill Bamberg, MD;  Location: [facility] OR;  Service: Orthopedics;  Laterality: N/A;  Anterior cervical decompression fusion, cervical 4-5, cervical 5-6, cervical 6-7 with instrumentation and allograft   BACK SURGERY     BILATERAL SALPINGECTOMY  09/03/2012   Procedure: BILATERAL SALPINGECTOMY;  Surgeon: Ok Edwards, MD;  Location: [facility];  Service: Gynecology;  Laterality: Bilateral;   BREAST BIOPSY Right 04/07/2014    Procedure: REMOVAL RIGHT BREAST MASS WITH WIRE LOCALIZATION;  Surgeon: Adolph Pollack, MD;  Location: [facility] OR;  Service: General;  Laterality: Right;   BREAST EXCISIONAL BIOPSY Left    x2   BREAST LUMPECTOMY     x2   CARPAL TUNNEL RELEASE Left 05/11/2019   Procedure: LEFT CARPAL TUNNEL RELEASE, RIGHT TENNIS ELBOW MARCAINE/DEPO MEDROL INJECTION UNDER ANESTHESIA;  Surgeon: Kerrin Champagne, MD;  Location: [facility] OR;  Service: Orthopedics;  Laterality: Left;   COLONOSCOPY W/ BIOPSIES  04/24/2012   per Dr. Leone Payor, clear, repeat in 10 yrs    disectomy     ESOPHAGOGASTRODUODENOSCOPY     FINGER SURGERY     Right index-excision of mass    FOOT SURGERY Right    Bone Spurs   IR FLUORO GUIDE CV LINE RIGHT  01/29/2023   IR REMOVAL TUN CV CATH W/O FL  02/08/2023   IR US GUIDE VASC ACCESS RIGHT  01/31/2023   LAPAROSCOPIC HYSTERECTOMY  09/03/2012   Procedure: HYSTERECTOMY TOTAL LAPAROSCOPIC;  Surgeon: Ok Edwards, MD;  Location: [facility];  Service: Gynecology;  Laterality: N/A;   LUMBAR DISC SURGERY     TUBAL LIGATION     UMBILICAL HERNIA REPAIR N/A 05/01/2018   Procedure: UMBILICAL HERNIA REPAIR;  Surgeon: Jimmye Norman, MD;  Location: [facility] OR;  Service: General;  Laterality: N/A;   Patient Active Problem List   Diagnosis Date Noted   CIDP (chronic inflammatory demyelinating polyneuropathy) (HCC) 01/28/2023   Gait abnormality 01/28/2023   Glaucoma 01/28/2023   Pain due to onychomycosis of toenails of both feet 01/21/2023   Wheelchair dependence 12/30/2022   Nerve pain 12/30/2022   Insomnia due to medical condition 12/30/2022   Acute inflammatory demyelinating polyneuropathy (HCC) 10/01/2022   UTI (urinary tract infection) 09/24/2022   Hypomagnesemia 09/24/2022   Weakness 09/23/2022   Hypokalemia 09/23/2022   B12 deficiency 09/05/2022   Folate deficiency 09/05/2022   Carpal tunnel syndrome, left upper limb 05/11/2019    Class: Chronic   Spinal stenosis of lumbar region with neurogenic claudication  08/20/2018   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   Primary osteoarthritis of right knee 02/27/2017   Right tennis elbow 11/11/2016   Muscle cramps 01/15/2016   Bilateral leg edema 01/08/2016   Radiculopathy 12/20/2015   Hyperglycemia 12/21/2014   Mastodynia, female 11/30/2014   S/P excision of fibroadenoma of breast 11/30/2014   Angioedema of lips 04/07/2014   Fibroadenoma of right breast 03/07/2014   Pelvic pain in female 06/19/2012   Menorrhagia 06/11/2012   SUI (stress urinary incontinence, female) 06/11/2012   HTN (hypertension) 06/11/2012   IBS (irritable bowel syndrome) - diarrhea predominant 03/17/2012   MICROSCOPIC HEMATURIA 11/30/2010   BREAST PAIN, RIGHT 11/30/2010   BREAST MASS, RIGHT 01/12/2010   HYPERCHOLESTEROLEMIA 12/07/2007   CIGARETTE SMOKER 12/07/2007   DEGENERATIVE JOINT DISEASE 12/07/2007   Low back pain with sciatica 12/07/2007   HEADACHE 12/07/2007   Obesity, Class III, BMI 40-49.9 (morbid obesity) (HCC) 08/03/2007   Anxiety state 08/03/2007    ONSET DATE: 12/05/22- referral date  REFERRING DIAG: G61.0 (ICD-10-CM) - Guillain Barr syndrome (HCC)  THERAPY DIAG:    Rationale for Evaluation and Treatment: Rehabilitation  SUBJECTIVE:   SUBJECTIVE STATEMENT: Pt reports  increased knee pain PERTINENT HISTORY: Pt is a 57 y/o female hospitalized 09/23/22  with progressive weakness and sensation changes, consistent with Guillain-Barr syndrome. MRI negative. LP results and assessment most consistent with Guillain-Barr syndrome.  Pt was d/c home from CIR 11/06/22 PMH includes: glaucoma anxiety, DJD, fibromyalgia, HTN, tobacco use, B-12 deficiency, OA, ACDF 2017.  Pt was re-hospitalized 01/28/2456 with CIDP (chronic inflammatory demyelinating polyneuropathy). Saw Neuro on day of admission, NCS showed demyelination, referred to hospital for PLEX. Initially diagnosed with an acute demyelinating neuropathy Jan 2024, treated with IVIG. PMH: Spinal stenosis, HTN,  Obesity   PRECAUTIONS: Fall  WEIGHT BEARING RESTRICTIONS: no  PAIN:  Are you having pain? Yes: 6/10 NPRS scale: mild pain Pain location: R knee, left knee Pain description: aching Aggravating factors: standing Relieving factors: rest    FALLS: Has patient fallen in last 6 months? No  LIVING ENVIRONMENT: Lives with: lives with their spouse Lives in: House/apartment Stairs: has ramp built Has following equipment at home:   BSC, steadi,  tub bench but can't access bathroom   PLOF: Independent  PATIENT GOALS: increase independence   OBJECTIVE:   HAND DOMINANCE: Right  ADLs: from initial eval Overall ADLs: assistance required, unable to access BR Transfers/ambulation related to ADLs: Eating: unable to cut food Grooming: set up UB Dressing: set up for bra and shirt LB Dressing: max A Toileting: uses BSC uses steadi Bathing: min-mod A in hospital  bed  Tub Shower transfers: n/a Equipment: Emergency planning/management officer  IADLs:dependent for IADLS   MOBILITY STATUS:  uses w/c  ,  per PT report minguard for transfer from w/c to mat    ACTIVITY TOLERANCE: Activity tolerance: Pt fatigues quickly    UPPER EXTREMITY ROM:  see updates for 02/18/23 re-eval   Active ROM Right 02/18/23 Left 02/18/23  Shoulder flexion 130 120  Shoulder abduction 90 90  Shoulder adduction    Shoulder extension    Shoulder internal rotation    Shoulder external rotation    Elbow flexion    Elbow extension  -5  Wrist flexion    Wrist extension    Wrist ulnar deviation    Wrist radial deviation    Wrist pronation    Wrist supination    (Blank rows = not tested)  UPPER EXTREMITY MMT:   see below for re-eval 02/18/23  MMT Right 02/18/23 Left 02/18/23  Shoulder flexion 3+/5 3+/5  Shoulder abduction    Shoulder adduction    Shoulder extension    Shoulder internal rotation    Shoulder external rotation    Middle trapezius    Lower trapezius    Elbow flexion 4/5 4/5  Elbow extension 4-/5 4-/5   Wrist flexion    Wrist extension    Wrist ulnar deviation    Wrist radial deviation    Wrist pronation    Wrist supination      HAND FUNCTION: Grip strength: Right: 32 lbs; Left: 18 lbs, 02/18/23 R 55 lbs, L 35 lbs  COORDINATION: 9 Hole Peg test:initial eval  Right: 1 min 48 sec; Left: 4 pegs in 2 mins  Box and Blocks:  Right 35 blocks, Left  26 blocks 02/18/23- RUE 35.46, LUE 65 secs  SENSATION: Light touch: WFL Hot/Cold: Not tested    COGNITION: Overall cognitive status:NT   VISION: Subjective report: reports hx of glaucoma VISION ASSESSMENT: Not tested  OBSERVATIONS: Pt is eager to improve, pt's husband is very supportive   TODAY'S TREATMENT:                                                                                                                              DATE:04/08/23-Pt ambulated approximately 5 feet to walk to arm bike with rolling walker with close supervision. Arm bike x 6 mins level 1 for conditioning Min a for sit to stand from armbike then pt ambulated back to w/c. Seated in w/c closed chain shoulder flexion, with unweighted ball followed by shoulder flexion then trunk rotation and chest press wih 2.2 lbs weighted ball for UE strength and endurance, rest between sets. Fine motor coordination task to copy small peg design with LUE, min-mod difficulty, v.c to avoid shoulder hike/ compensation.  04/01/23- Seated closed chain shoulder flexion, with unweighted ball followed by shoulder flexion then trunk rotation and chest press wih 2.2 lbs weighted ball. Rest in between sets seated on mat. Pt walker to mat with walker and supervision. Standing at countertop functional reaching to place graded clothespins in targets with RUE, rest break then pt removed with left hand,  close supervision. Discussed plans to continue OT after next week. Pt walked mat to w/c with walker and close supervision .   03/27/23-Theraband exercises for biceps curls, triceps, rowing  and  shoulder extension with green band and shoulder abduction with red band 10-15 reps each, min v.c, rest break in between sets Standing at countertop to place and remove items from lowest overhead shelf, seated rest break prior to replacing, close supervision for moblity with walker  Gripper set at level 2 sustained grip to pick up 1 inch blocks, 1/2 with RUE with min difficulty and 1/2 with left with min-mod difficulty for sustained grip Graded clothespins for sustained pinch 1-8# using left and right UE's for sustained pinch, RUE only with black clothespins dur to difficulty. Therapist recommends that pt orders a long handled sponge to apply lotion onto legs. Pt with continued bilateral LE swelling.  03/25/23 Pt is hoarse today and reports a tickling in her throat as well as LE swelling. Therapist recommends pt calls MD.Pt arrived late as her transportation did not pick her up. Arm bike x 6 mins level 1 for conditioning.Pt transferred to and from arm bike to w/c with min A. Copying small peg design for increased fine motor coordination with LUE, min difficulty and increased time, removing with in hand manipulation. Pt demonstrates improved fine motor coordination.  03/18/23- Pt amb across gym with RW and minguard to arm bike.  Arm bike x 7 mins level 1  for conditioning Placing small pegs into pegboard with LUE for increased fine motor coordination, pt with improved performance and coordination today. Pt walked 3 ft from chair to counter and stood to place and remove items from overhead shelves with 1 seated rest break. Discussion with patient regarding her home situation,. Pt reports frustration and she feels like she is a burden on husband. Therapist recommends pt seeks counseling. Shoulder abduction  with red band biceps curls with green band  20 reps for UE strengthening   03/13/23-Pt reports she had a stomach bug and she wasn't feeling well. Placing large pegs into pegboard with LUE with  occasional min difficulty, then removing with 3 pt pinch min v.c to avoid shoulder hike. Red theraband exercises for shoulder abduction, rowing and extension 15 reps each,  green band for biceps curls and triceps extension 15 reps each, min v.c Arm bike x 6 mins level 3 for conditioning, pt transfered to and from with min A Discussed safety for cooking and recommendation that if pt decides to fry fish, she performs with someone for safety, and she keeps her LUE away from hot surfaces due to sensory deficits. Pt verbalized understanding.   03/04/23 Red theraband exercises for shoulder abduction, rowing and extension 20 reps each, upgraded to green band for biceps curls and triceps extension 20 reps each, min v.c Arm bike x 8 mins, 4 mins each direction, level 2 for conditioning, pt transferred to and from seat with supervision. Coordination activities with LUE: flipping and dealing cards, min difficulty/ v.c  02/27/23-Arm bike x 6 mins level 2 for conditioning (Pt transfers to and from w/c with supervision) Placing large pegs into pegboard with LUE then removing for increased fine motor coordination, min difficulty/ v.c  Standing at countertop with supervision to place and remove items with right then left UE's, pt stood x 3 for 30 secs to 1 min 15 secs, rest breaks in between Gripper set at level 1 to pick up 1 inch blocks  with right and left UE's  for sustained grip, min difficulty Pt reports amb to BR with walker without assist, therapist recommends supervision for safety.  02/25/23-Arm bike x 6 mins level 2 for conditioning, Pt transferred to the seat for the first time with supervision Reviewed previously issued red theraband exercises for UE strength 20 reps each, min v.c  Fine motor coordination exercises with left and right UE's, flipping playing cards and stacking coins with min difficulty using RUE, picking up coins and placing in container with LUE and flipping cards mod difficulty. Pt  attempted placing small pegs into a pegboard with bilateral UE's max difficulty with LUE, mod difficulty with RUE, task was discontinued. Pt reports being a little off. She took her meds without eating and got into a disagreement with transportation this a.m.    02/18/23- re-eval performed and therapist discussed potential goals with patient.  PATIENT EDUCATION: Education details:   see above Person educated: Patient Education method: explanation, demonstration Education comprehension: verbalized understanding, returned demonstration.  HOME EXERCISE PROGRAM:  12/17/22- coordination HEP, cane exercises seated   GOALS: Goals reviewed with patient? Yes  SHORT TERM GOALS: Target date: 03/20/23  I with initial HEP Baseline: Goal status: met 01/21/23  2.  Pt will demonstrate improved UE functional use as evidenced increasing bilateral box/ blocks score by 4 blocks.  Baseline: R 35, L 26 Goal status  met 12/31/22 RUE :43:  LUE 35    3.  Pt will donn pants with mod A. Baseline: max A Goal status:met  02/18/23- pt performed mod I today  4. Pt will verbalize understanding of adapted strategies to maximize safety and I with ADLs/ IADLs .  Goal status:  ongoing, has tub bench and long handled sponge  5.  Pt will demonstrate ability to stand for functional activity with min A x 10 mins in prep for ADLs.  Goal status: not consistent, pt stood for 1 min on 02/18/23  6.  Pt will perform w/c to [facility] transfers mod I  Goal status:  met, 02/18/23 7. I with updated HEP  Goal status- ongoing 03/18/23     LONG TERM GOALS: Target date: 05/13/23 I with updated HEP  Goal status: ongoing, 02/18/23  2.  Pt will perform LB dressing mod I. Baseline:max A Goal status:met 03/25/23  3.  Pt will bathe with supervision/ set up. Baseline: min-mod A for sponge bath Goal status: met for sponge bathing at set up   4.  Pt will perform basic cooking at a w/c level mod I  Revised goal: Pt will perform basic cooking  modified independently at a walker level.  Goal status:  met, 02/18/23  pt has performed simple tasks mod I w/c level, goal revised - Ongoing, pt is currently performing at a w/c level-03/25/23  5.  Pt will demonstrate improved fine motor coordination as evidenced by performing LUE 9 hole peg test in 2 mins or less.  Revised goal:Pt will demonstrate improved fine motor coordination as evidenced by performing 9 hole peg test in 55 secs or less  Goal status: initial goal met 6/4/246/4/24-  LUE 65 secs , goal revised  6.  Pt will demonstrate improved fine motor coordination as evidenced by performing RUE 9 hole peg test in 1 mins  30 secs or less.  Baseline: RUE  1 min 48 secs, LUE 4 pegs placed in 2 mins  Goal status: met 6/4/246/4/24- RUE 35.46,     7. Pt will perform shower with supervision using tub transfer bench  Goal status: ongoing min A 03/25/23   8. Pt will perform basic home management at a walker level mod I    Goal status: ongoing, 03/25/23 ASSESSMENT:  CLINICAL IMPRESSION: Pt is progressing towards goals.Pt  reports she is getting IVIG treatments next week. Pt demonstrates improving functional mobility however she is limited by knee pain today. Pt demonstrates improving LUE coordination. PERFORMANCE DEFICITS: in functional skills including ADLs, IADLs, coordination, dexterity, sensation, ROM, strength, flexibility, Fine motor control, Gross motor control, mobility, balance, endurance, decreased knowledge of precautions, decreased knowledge of use of DME, and UE functional use, cognitive skills including  and psychosocial skills including coping strategies, environmental adaptation, habits, interpersonal interactions, and routines and behaviors.   IMPAIRMENTS: are limiting patient from ADLs, IADLs, rest and sleep, play, leisure, and social participation.   CO-MORBIDITIES: may have co-morbidities  that affects occupational performance. Patient will benefit from skilled OT to address  above impairments and improve overall function.  MODIFICATION OR ASSISTANCE TO COMPLETE EVALUATION: No modification of tasks or assist necessary to complete an evaluation.  OT OCCUPATIONAL PROFILE AND HISTORY: Detailed assessment: Review of records and additional review of physical, cognitive, psychosocial history related to current functional performance.  CLINICAL DECISION MAKING: LOW - limited treatment options, no task modification necessary  REHAB POTENTIAL: Good  EVALUATION COMPLEXITY: Low    PLAN:  OT FREQUENCY: 2x/week  OT DURATION: 12 weeks  PLANNED INTERVENTIONS: self care/ADL training, therapeutic exercise, therapeutic activity, neuromuscular re-education, manual therapy, passive range of motion, gait training, balance training, stair training, functional mobility training, aquatic therapy, ultrasound, paraffin, moist heat, cryotherapy, patient/family education, energy conservation, coping strategies training, DME and/or AE instructions, and Re-evaluation  RECOMMENDED OTHER SERVICES: PT  CONSULTED AND AGREED WITH PLAN OF CARE: Patient and family member/caregiver  PLAN FOR NEXT SESSION: standing tolerance, transfers, UE strength and coordination, ADL strategies  Mallie Linnemann, OT 04/08/2023, 10:04 AM

## 2023-04-08 NOTE — Addendum Note (Signed)
Addended by: Gershon Crane A on: 04/08/2023 04:08 PM   Modules accepted: Orders

## 2023-04-09 MED ORDER — MORPHINE SULFATE ER 60 MG PO TBCR: 60.0000 mg | EXTENDED_RELEASE_TABLET | Freq: Two times a day (BID) | ORAL | 0 refills | Status: DC

## 2023-04-09 NOTE — Telephone Encounter (Signed)
Please call to cancel the Fentanyl patches. Instead I have sent I for her to try MS Contin for a month.

## 2023-04-09 NOTE — Telephone Encounter (Signed)
Spoke with pt advised of Dr Clent Ridges recommendation, pt declines using Fentanyl Patches for pain. Pt requesting to have Rx for MS CONTIN. Please advise

## 2023-04-09 NOTE — Addendum Note (Signed)
Addended by: Gershon Crane A on: 04/09/2023 04:59 PM   Modules accepted: Orders

## 2023-04-10 ENCOUNTER — Ambulatory Visit: Payer: 59 | Admitting: Occupational Therapy

## 2023-04-10 ENCOUNTER — Ambulatory Visit: Payer: 59 | Admitting: Physical Therapy

## 2023-04-10 ENCOUNTER — Telehealth: Payer: Self-pay | Admitting: Family Medicine

## 2023-04-10 NOTE — Telephone Encounter (Signed)
Left detailed message for pt with Dr Fry advise 

## 2023-04-10 NOTE — Telephone Encounter (Signed)
Pt call and stated she is not going to take those Fentanyl patch's and want you to give her a call back.

## 2023-04-10 NOTE — Telephone Encounter (Signed)
Spoke with pt pharmacy cancelled Rx for Fentanyl Patches, left pt a voicemail

## 2023-04-15 ENCOUNTER — Ambulatory Visit: Payer: 59 | Admitting: Occupational Therapy

## 2023-04-15 ENCOUNTER — Ambulatory Visit: Payer: 59 | Admitting: Physical Therapy

## 2023-04-17 ENCOUNTER — Ambulatory Visit: Payer: 59 | Admitting: Physical Therapy

## 2023-04-17 ENCOUNTER — Ambulatory Visit: Payer: 59 | Admitting: Occupational Therapy

## 2023-04-21 ENCOUNTER — Encounter: Payer: 59 | Attending: Physical Medicine and Rehabilitation | Admitting: Physical Medicine and Rehabilitation

## 2023-04-21 DIAGNOSIS — G610 Guillain-Barre syndrome: Secondary | ICD-10-CM | POA: Insufficient documentation

## 2023-04-21 DIAGNOSIS — Z993 Dependence on wheelchair: Secondary | ICD-10-CM | POA: Insufficient documentation

## 2023-04-21 DIAGNOSIS — M792 Neuralgia and neuritis, unspecified: Secondary | ICD-10-CM | POA: Insufficient documentation

## 2023-04-21 DIAGNOSIS — G4701 Insomnia due to medical condition: Secondary | ICD-10-CM | POA: Insufficient documentation

## 2023-04-22 ENCOUNTER — Encounter: Payer: Self-pay | Admitting: Occupational Therapy

## 2023-04-22 ENCOUNTER — Ambulatory Visit: Payer: 59 | Admitting: Occupational Therapy

## 2023-04-22 ENCOUNTER — Ambulatory Visit: Payer: 59 | Attending: Family Medicine | Admitting: Physical Therapy

## 2023-04-22 ENCOUNTER — Telehealth: Payer: Self-pay

## 2023-04-22 ENCOUNTER — Encounter: Payer: Self-pay | Admitting: Physical Therapy

## 2023-04-22 DIAGNOSIS — R2689 Other abnormalities of gait and mobility: Secondary | ICD-10-CM | POA: Insufficient documentation

## 2023-04-22 DIAGNOSIS — R262 Difficulty in walking, not elsewhere classified: Secondary | ICD-10-CM | POA: Diagnosis present

## 2023-04-22 DIAGNOSIS — M6281 Muscle weakness (generalized): Secondary | ICD-10-CM | POA: Insufficient documentation

## 2023-04-22 DIAGNOSIS — G6181 Chronic inflammatory demyelinating polyneuritis: Secondary | ICD-10-CM | POA: Diagnosis present

## 2023-04-22 DIAGNOSIS — R2681 Unsteadiness on feet: Secondary | ICD-10-CM | POA: Insufficient documentation

## 2023-04-22 DIAGNOSIS — R278 Other lack of coordination: Secondary | ICD-10-CM

## 2023-04-22 DIAGNOSIS — G610 Guillain-Barre syndrome: Secondary | ICD-10-CM | POA: Insufficient documentation

## 2023-04-22 NOTE — Therapy (Addendum)
OUTPATIENT OCCUPATIONAL THERAPY NEURO TREATMENT AND PROGRESS UPDATE  Patient Name: Dana [name] MRN: [id number] DOB:May 17, 1966, 57 y.o., female Today's Date: 04/22/2023  PCP: Dr. Clent Ridges REFERRING PROVIDER: Dr. Gershon Crane  END OF SESSION:  OT End of Session - 04/22/23 0854     Visit Number 20    Number of Visits 35    Date for OT Re-Evaluation 05/13/23    Authorization Type UHC, Medicare    Authorization - Visit Number 20    Progress Note Due on Visit 20    OT Start Time [phone]                     Past Medical History:  Diagnosis Date   Anxiety    Back pain with radiation    Breast mass, right    Chronic pain    Chronic, continuous use of opioids    Complication of anesthesia 04/07/14   Allergic reaction to Lisinopril immediately following surgery   DJD (degenerative joint disease)    Fibromyalgia    Groin abscess    Headache(784.0)    History of IBS    Hypercholesterolemia    IBS (irritable bowel syndrome)    Lactose intolerance    Mild hypertension    Obesity    Tobacco use disorder    Umbilical hernia    Symptomatic   Past Surgical History:  Procedure Laterality Date   ABDOMINAL HYSTERECTOMY     ANTERIOR CERVICAL DECOMP/DISCECTOMY FUSION N/A 12/20/2015   Procedure: ANTERIOR CERVICAL DECOMPRESSION FUSION CERVICAL 4-5, CERVICAL 5-6, CERVICAL 6-7 WITH INSTRUMENTATION AND ALLOGRAFT;  Surgeon: Estill Bamberg, MD;  Location: [facility] OR;  Service: Orthopedics;  Laterality: N/A;  Anterior cervical decompression fusion, cervical 4-5, cervical 5-6, cervical 6-7 with instrumentation and allograft   BACK SURGERY     BILATERAL SALPINGECTOMY  09/03/2012   Procedure: BILATERAL SALPINGECTOMY;  Surgeon: Ok Edwards, MD;  Location: [facility] ORS;  Service: Gynecology;  Laterality: Bilateral;   BREAST BIOPSY Right 04/07/2014   Procedure: REMOVAL RIGHT BREAST MASS WITH WIRE LOCALIZATION;  Surgeon: Adolph Pollack, MD;  Location: [facility] OR;  Service: General;  Laterality: Right;    BREAST EXCISIONAL BIOPSY Left    x2   BREAST LUMPECTOMY     x2   CARPAL TUNNEL RELEASE Left 05/11/2019   Procedure: LEFT CARPAL TUNNEL RELEASE, RIGHT TENNIS ELBOW MARCAINE/DEPO MEDROL INJECTION UNDER ANESTHESIA;  Surgeon: Kerrin Champagne, MD;  Location: [facility] OR;  Service: Orthopedics;  Laterality: Left;   COLONOSCOPY W/ BIOPSIES  04/24/2012   per Dr. Leone Payor, clear, repeat in 10 yrs    disectomy     ESOPHAGOGASTRODUODENOSCOPY     FINGER SURGERY     Right index-excision of mass    FOOT SURGERY Right    Bone Spurs   IR FLUORO GUIDE CV LINE RIGHT  01/29/2023   IR REMOVAL TUN CV CATH W/O FL  02/08/2023   IR US GUIDE VASC ACCESS RIGHT  01/31/2023   LAPAROSCOPIC HYSTERECTOMY  09/03/2012   Procedure: HYSTERECTOMY TOTAL LAPAROSCOPIC;  Surgeon: Ok Edwards, MD;  Location: [facility];  Service: Gynecology;  Laterality: N/A;   LUMBAR DISC SURGERY     TUBAL LIGATION     UMBILICAL HERNIA REPAIR N/A 05/01/2018   Procedure: UMBILICAL HERNIA REPAIR;  Surgeon: Jimmye Norman, MD;  Location: [facility] OR;  Service: General;  Laterality: N/A;   Patient Active Problem List   Diagnosis Date Noted   CIDP (chronic inflammatory demyelinating polyneuropathy) (HCC) 01/28/2023   Gait  abnormality 01/28/2023   Glaucoma 01/28/2023   Pain due to onychomycosis of toenails of both feet 01/21/2023   Wheelchair dependence 12/30/2022   Nerve pain 12/30/2022   Insomnia due to medical condition 12/30/2022   Acute inflammatory demyelinating polyneuropathy (HCC) 10/01/2022   UTI (urinary tract infection) 09/24/2022   Hypomagnesemia 09/24/2022   Weakness 09/23/2022   Hypokalemia 09/23/2022   B12 deficiency 09/05/2022   Folate deficiency 09/05/2022   Carpal tunnel syndrome, left upper limb 05/11/2019    Class: Chronic   Spinal stenosis of lumbar region with neurogenic claudication 08/20/2018   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   Primary osteoarthritis of right knee 02/27/2017   Right tennis elbow 11/11/2016   Muscle  cramps 01/15/2016   Bilateral leg edema 01/08/2016   Radiculopathy 12/20/2015   Hyperglycemia 12/21/2014   Mastodynia, female 11/30/2014   S/P excision of fibroadenoma of breast 11/30/2014   Angioedema of lips 04/07/2014   Fibroadenoma of right breast 03/07/2014   Pelvic pain in female 06/19/2012   Menorrhagia 06/11/2012   SUI (stress urinary incontinence, female) 06/11/2012   HTN (hypertension) 06/11/2012   IBS (irritable bowel syndrome) - diarrhea predominant 03/17/2012   MICROSCOPIC HEMATURIA 11/30/2010   BREAST PAIN, RIGHT 11/30/2010   BREAST MASS, RIGHT 01/12/2010   HYPERCHOLESTEROLEMIA 12/07/2007   CIGARETTE SMOKER 12/07/2007   DEGENERATIVE JOINT DISEASE 12/07/2007   Low back pain with sciatica 12/07/2007   HEADACHE 12/07/2007   Obesity, Class III, BMI 40-49.9 (morbid obesity) (HCC) 08/03/2007   Anxiety state 08/03/2007    ONSET DATE: 12/05/22- referral date  REFERRING DIAG: G61.0 (ICD-10-CM) - Guillain Barr syndrome (HCC)  THERAPY DIAG:    Rationale for Evaluation and Treatment: Rehabilitation  SUBJECTIVE:   SUBJECTIVE STATEMENT: Pt reports  increased knee pain PERTINENT HISTORY: Pt is a 57 y/o female hospitalized 09/23/22  with progressive weakness and sensation changes, consistent with Guillain-Barr syndrome. MRI negative. LP results and assessment most consistent with Guillain-Barr syndrome.  Pt was d/c home from CIR 11/06/22 PMH includes: glaucoma anxiety, DJD, fibromyalgia, HTN, tobacco use, B-12 deficiency, OA, ACDF 2017.  Pt was re-hospitalized 01/28/2456 with CIDP (chronic inflammatory demyelinating polyneuropathy). Saw Neuro on day of admission, NCS showed demyelination, referred to hospital for PLEX. Initially diagnosed with an acute demyelinating neuropathy Jan 2024, treated with IVIG. PMH: Spinal stenosis, HTN, Obesity   PRECAUTIONS: Fall  WEIGHT BEARING RESTRICTIONS: no  PAIN:  Are you having pain? Yes: 6/10 NPRS scale: mild pain Pain location: R  knee, left knee Pain description: aching Aggravating factors: standing Relieving factors: rest    FALLS: Has patient fallen in last 6 months? No  LIVING ENVIRONMENT: Lives with: lives with their spouse Lives in: House/apartment Stairs: has ramp built Has following equipment at home:   BSC, steadi,  tub bench but can't access bathroom   PLOF: Independent  PATIENT GOALS: increase independence   OBJECTIVE:   HAND DOMINANCE: Right  ADLs: from initial eval Overall ADLs: assistance required, unable to access BR Transfers/ambulation related to ADLs: Eating: unable to cut food Grooming: set up UB Dressing: set up for bra and shirt LB Dressing: max A Toileting: uses BSC uses steadi Bathing: min-mod A in hospital  bed  Tub Shower transfers: n/a Equipment: Emergency planning/management officer  IADLs:dependent for IADLS   MOBILITY STATUS:  uses w/c  , per PT report minguard for transfer from w/c to mat    ACTIVITY TOLERANCE: Activity tolerance: Pt fatigues quickly    UPPER EXTREMITY ROM:  see updates for 02/18/23 re-eval  Active ROM Right 02/18/23 Left 02/18/23  Shoulder flexion 130 120  Shoulder abduction 90 90  Shoulder adduction    Shoulder extension    Shoulder internal rotation    Shoulder external rotation    Elbow flexion    Elbow extension  -5  Wrist flexion    Wrist extension    Wrist ulnar deviation    Wrist radial deviation    Wrist pronation    Wrist supination    (Blank rows = not tested)  UPPER EXTREMITY MMT:   see below for re-eval 02/18/23  MMT Right 02/18/23 Left 02/18/23  Shoulder flexion 3+/5 3+/5  Shoulder abduction    Shoulder adduction    Shoulder extension    Shoulder internal rotation    Shoulder external rotation    Middle trapezius    Lower trapezius    Elbow flexion 4/5 4/5  Elbow extension 4-/5 4-/5  Wrist flexion    Wrist extension    Wrist ulnar deviation    Wrist radial deviation    Wrist pronation    Wrist supination      HAND  FUNCTION: Grip strength: Right: 32 lbs; Left: 18 lbs, 02/18/23 R 55 lbs, L 35 lbs  COORDINATION: 9 Hole Peg test:initial eval  Right: 1 min 48 sec; Left: 4 pegs in 2 mins  Box and Blocks:  Right 35 blocks, Left  26 blocks 02/18/23- RUE 35.46, LUE 65 secs  SENSATION: Light touch: WFL Hot/Cold: Not tested    COGNITION: Overall cognitive status:NT   VISION: Subjective report: reports hx of glaucoma VISION ASSESSMENT: Not tested  OBSERVATIONS: Pt is eager to improve, pt's husband is very supportive   TODAY'S TREATMENT:                                                                                                                              DATE:  04/22/23:  This progress update covers dates of service from 6/4-04/22/23.   Started session working on LUE coordination.  Copying small peg patterns.  Patient with frequent drops, difficulty orienting small pegs in hand.  Patient with report of diminished sensation in left hand.  Attempted same task with left hand in glove with slightly increased ability initially - however - not able to sustain.   Worked on hand strengthening with gripper level 2 x 20 blocks.  Faciliatation an dcueing to reduce trunk lateral flexion and shoulder elevation.  Attempted 5 blocks at level 3 - fatigued after 3 blocks.   Standing balance/ tolerance at counter - working toward reducing UE reliance - reaching into chest high shelf, reaching to shelf at eye level.  Stand with walker and taking slow cautious steps to counter.    04/08/23-Pt ambulated approximately 5 feet to walk to arm bike with rolling walker with close supervision. Arm bike x 6 mins level 1 for conditioning Min a for sit to stand from armbike then pt ambulated back to w/c. Seated in w/c  closed chain shoulder flexion, with unweighted ball followed by shoulder flexion then trunk rotation and chest press wih 2.2 lbs weighted ball for UE strength and endurance, rest between sets. Fine motor coordination task  to copy small peg design with LUE, min-mod difficulty, v.c to avoid shoulder hike/ compensation.  04/01/23- Seated closed chain shoulder flexion, with unweighted ball followed by shoulder flexion then trunk rotation and chest press wih 2.2 lbs weighted ball. Rest in between sets seated on mat. Pt walker to mat with walker and supervision. Standing at countertop functional reaching to place graded clothespins in targets with RUE, rest break then pt removed with left hand, close supervision. Discussed plans to continue OT after next week. Pt walked mat to w/c with walker and close supervision .   03/27/23-Theraband exercises for biceps curls, triceps, rowing  and shoulder extension with green band and shoulder abduction with red band 10-15 reps each, min v.c, rest break in between sets Standing at countertop to place and remove items from lowest overhead shelf, seated rest break prior to replacing, close supervision for moblity with walker  Gripper set at level 2 sustained grip to pick up 1 inch blocks, 1/2 with RUE with min difficulty and 1/2 with left with min-mod difficulty for sustained grip Graded clothespins for sustained pinch 1-8# using left and right UE's for sustained pinch, RUE only with black clothespins dur to difficulty. Therapist recommends that pt orders a long handled sponge to apply lotion onto legs. Pt with continued bilateral LE swelling.  03/25/23 Pt is hoarse today and reports a tickling in her throat as well as LE swelling. Therapist recommends pt calls MD.Pt arrived late as her transportation did not pick her up. Arm bike x 6 mins level 1 for conditioning.Pt transferred to and from arm bike to w/c with min A. Copying small peg design for increased fine motor coordination with LUE, min difficulty and increased time, removing with in hand manipulation. Pt demonstrates improved fine motor coordination.  03/18/23- Pt amb across gym with RW and minguard to arm bike.  Arm bike x 7 mins  level 1  for conditioning Placing small pegs into pegboard with LUE for increased fine motor coordination, pt with improved performance and coordination today. Pt walked 3 ft from chair to counter and stood to place and remove items from overhead shelves with 1 seated rest break. Discussion with patient regarding her home situation,. Pt reports frustration and she feels like she is a burden on husband. Therapist recommends pt seeks counseling. Shoulder abduction  with red band biceps curls with green band  20 reps for UE strengthening   03/13/23-Pt reports she had a stomach bug and she wasn't feeling well. Placing large pegs into pegboard with LUE with occasional min difficulty, then removing with 3 pt pinch min v.c to avoid shoulder hike. Red theraband exercises for shoulder abduction, rowing and extension 15 reps each,  green band for biceps curls and triceps extension 15 reps each, min v.c Arm bike x 6 mins level 3 for conditioning, pt transfered to and from with min A Discussed safety for cooking and recommendation that if pt decides to fry fish, she performs with someone for safety, and she keeps her LUE away from hot surfaces due to sensory deficits. Pt verbalized understanding.   03/04/23 Red theraband exercises for shoulder abduction, rowing and extension 20 reps each, upgraded to green band for biceps curls and triceps extension 20 reps each, min v.c Arm bike x 8 mins, 4  mins each direction, level 2 for conditioning, pt transferred to and from seat with supervision. Coordination activities with LUE: flipping and dealing cards, min difficulty/ v.c  02/27/23-Arm bike x 6 mins level 2 for conditioning (Pt transfers to and from w/c with supervision) Placing large pegs into pegboard with LUE then removing for increased fine motor coordination, min difficulty/ v.c  Standing at countertop with supervision to place and remove items with right then left UE's, pt stood x 3 for 30 secs to 1 min 15  secs, rest breaks in between Gripper set at level 1 to pick up 1 inch blocks  with right and left UE's for sustained grip, min difficulty Pt reports amb to BR with walker without assist, therapist recommends supervision for safety.  02/25/23-Arm bike x 6 mins level 2 for conditioning, Pt transferred to the seat for the first time with supervision Reviewed previously issued red theraband exercises for UE strength 20 reps each, min v.c  Fine motor coordination exercises with left and right UE's, flipping playing cards and stacking coins with min difficulty using RUE, picking up coins and placing in container with LUE and flipping cards mod difficulty. Pt attempted placing small pegs into a pegboard with bilateral UE's max difficulty with LUE, mod difficulty with RUE, task was discontinued. Pt reports being a little off. She took her meds without eating and got into a disagreement with transportation this a.m.    02/18/23- re-eval performed and therapist discussed potential goals with patient.  PATIENT EDUCATION: Education details:   see above Person educated: Patient Education method: explanation, demonstration Education comprehension: verbalized understanding, returned demonstration.  HOME EXERCISE PROGRAM:  12/17/22- coordination HEP, cane exercises seated   GOALS: Goals reviewed with patient? Yes  SHORT TERM GOALS: Target date: 03/20/23  I with initial HEP Baseline: Goal status: met 01/21/23  2.  Pt will demonstrate improved UE functional use as evidenced increasing bilateral box/ blocks score by 4 blocks.  Baseline: R 35, L 26 Goal status  met 12/31/22 RUE :43:  LUE 35    3.  Pt will donn pants with mod A. Baseline: max A Goal status:met  02/18/23- pt performed mod I today  4. Pt will verbalize understanding of adapted strategies to maximize safety and I with ADLs/ IADLs .  Goal status:  ongoing, has tub bench and long handled sponge  8/6  ongoing - patient relies on husband to  lift legs into tub - encouraging patient to increase participation with IADL at home.    5.  Pt will demonstrate ability to stand for functional activity with min A x 10 mins in prep for ADLs.  Goal status: not consistent, pt stood for 1 min on 02/18/23 8/6:  Stood x 2 min in clinic - patient reporting increased fatigue due to IVIG treatment.    6.  Pt will perform w/c to [facility] transfers mod I- 8/6- patient reports mod I - Need to assess in clinic- ongoing  Goal status:  met, 02/18/23 7. I with updated HEP  Goal status- ongoing 03/18/23  8/6 - goal met - yellow theraband.     LONG TERM GOALS: Target date: 05/13/23 I with updated HEP  Goal status: ongoing, 02/18/23  2.  Pt will perform LB dressing mod I. Baseline:max A Goal status:met 03/25/23- 8/6- patient indicates she is dressing herself.  Needs verification  3.  Pt will bathe with supervision/ set up. Baseline: min-mod A for sponge bath Goal status: met for sponge bathing at set  up 8/6 - min assist shower  4.  Pt will perform basic cooking at a w/c level mod I  Revised goal: Pt will perform basic cooking modified independently at a walker level.  Goal status:  met, 02/18/23  pt has performed simple tasks mod I w/c level, goal revised - Ongoing, pt is currently performing at a w/c level-03/25/23 8/6 ongoing - not walking in kitchen  5.  Pt will demonstrate improved fine motor coordination as evidenced by performing LUE 9 hole peg test in 2 mins or less.  Revised goal:Pt will demonstrate improved fine motor coordination as evidenced by performing 9 hole peg test in 55 secs or less 8/6- 63 seconds- ongoing  Goal status: initial goal met 6/4/246/4/24-  LUE 65 secs , goal revised  6.  Pt will demonstrate improved fine motor coordination as evidenced by performing RUE 9 hole peg test in 1 mins  30 secs or less.  Baseline: RUE  1 min 48 secs, LUE 4 pegs placed in 2 mins  Goal status: met 6/4/246/4/24- RUE 35.46,     7. Pt will perform shower  with supervision using tub transfer bench    Goal status: ongoing min A 03/25/23  8/6-ongoing min   8. Pt will perform basic home management at a walker level mod I    Goal status: ongoing, 03/25/23  ongoing 8/6- ongoing ASSESSMENT:  CLINICAL IMPRESSION: Pt is progressing towards goals, however is reporting significant increase in fatigue with recent IVIG treatments- so several goals remain ongoing.  Pt demonstrates improving functional mobility and is walking routinely for exercise in home - encourgaing patient to walk for simple functional activity - eg simple snack prep. Pt demonstrates improving LUE coordination. PERFORMANCE DEFICITS: in functional skills including ADLs, IADLs, coordination, dexterity, sensation, ROM, strength, flexibility, Fine motor control, Gross motor control, mobility, balance, endurance, decreased knowledge of precautions, decreased knowledge of use of DME, and UE functional use, cognitive skills including  and psychosocial skills including coping strategies, environmental adaptation, habits, interpersonal interactions, and routines and behaviors.   IMPAIRMENTS: are limiting patient from ADLs, IADLs, rest and sleep, play, leisure, and social participation.   CO-MORBIDITIES: may have co-morbidities  that affects occupational performance. Patient will benefit from skilled OT to address above impairments and improve overall function.  MODIFICATION OR ASSISTANCE TO COMPLETE EVALUATION: No modification of tasks or assist necessary to complete an evaluation.  OT OCCUPATIONAL PROFILE AND HISTORY: Detailed assessment: Review of records and additional review of physical, cognitive, psychosocial history related to current functional performance.  CLINICAL DECISION MAKING: LOW - limited treatment options, no task modification necessary  REHAB POTENTIAL: Good  EVALUATION COMPLEXITY: Low    PLAN:  OT FREQUENCY: 2x/week  OT DURATION: 12 weeks  PLANNED INTERVENTIONS: self  care/ADL training, therapeutic exercise, therapeutic activity, neuromuscular re-education, manual therapy, passive range of motion, gait training, balance training, stair training, functional mobility training, aquatic therapy, ultrasound, paraffin, moist heat, cryotherapy, patient/family education, energy conservation, coping strategies training, DME and/or AE instructions, and Re-evaluation  RECOMMENDED OTHER SERVICES: PT  CONSULTED AND AGREED WITH PLAN OF CARE: Patient and family member/caregiver  PLAN FOR NEXT SESSION: standing tolerance, transfers, UE strength and coordination, ADL strategies  Collier Salina, OT 04/22/2023, 8:59 AM

## 2023-04-22 NOTE — Therapy (Signed)
OUTPATIENT PHYSICAL THERAPY LOWER EXTREMITY TREATMENT   Patient Name: Dana [name] MRN: [id number] DOB:June 15, 1966, 57 y.o., female Today's Date: 04/22/2023  END OF SESSION:  PT End of Session - 04/22/23 0743     Visit Number 19    Date for PT Re-Evaluation 05/23/23    Authorization Type [employer]    PT Start Time 0746    PT Stop Time 0840    PT Time Calculation (min) 54 min    Equipment Utilized During Treatment Gait belt    Activity Tolerance Patient tolerated treatment well    Behavior During Therapy WFL for tasks assessed/performed               Past Medical History:  Diagnosis Date   Anxiety    Back pain with radiation    Breast mass, right    Chronic pain    Chronic, continuous use of opioids    Complication of anesthesia 04/07/14   Allergic reaction to Lisinopril immediately following surgery   DJD (degenerative joint disease)    Fibromyalgia    Groin abscess    Headache(784.0)    History of IBS    Hypercholesterolemia    IBS (irritable bowel syndrome)    Lactose intolerance    Mild hypertension    Obesity    Tobacco use disorder    Umbilical hernia    Symptomatic   Past Surgical History:  Procedure Laterality Date   ABDOMINAL HYSTERECTOMY     ANTERIOR CERVICAL DECOMP/DISCECTOMY FUSION N/A 12/20/2015   Procedure: ANTERIOR CERVICAL DECOMPRESSION FUSION CERVICAL 4-5, CERVICAL 5-6, CERVICAL 6-7 WITH INSTRUMENTATION AND ALLOGRAFT;  Surgeon: Estill Bamberg, MD;  Location: [facility] OR;  Service: Orthopedics;  Laterality: N/A;  Anterior cervical decompression fusion, cervical 4-5, cervical 5-6, cervical 6-7 with instrumentation and allograft   BACK SURGERY     BILATERAL SALPINGECTOMY  09/03/2012   Procedure: BILATERAL SALPINGECTOMY;  Surgeon: Ok Edwards, MD;  Location: [facility] ORS;  Service: Gynecology;  Laterality: Bilateral;   BREAST BIOPSY Right 04/07/2014   Procedure: REMOVAL RIGHT BREAST MASS WITH WIRE LOCALIZATION;  Surgeon: Adolph Pollack, MD;  Location: [facility] OR;   Service: General;  Laterality: Right;   BREAST EXCISIONAL BIOPSY Left    x2   BREAST LUMPECTOMY     x2   CARPAL TUNNEL RELEASE Left 05/11/2019   Procedure: LEFT CARPAL TUNNEL RELEASE, RIGHT TENNIS ELBOW MARCAINE/DEPO MEDROL INJECTION UNDER ANESTHESIA;  Surgeon: Kerrin Champagne, MD;  Location: [facility] OR;  Service: Orthopedics;  Laterality: Left;   COLONOSCOPY W/ BIOPSIES  04/24/2012   per Dr. Leone Payor, clear, repeat in 10 yrs    disectomy     ESOPHAGOGASTRODUODENOSCOPY     FINGER SURGERY     Right index-excision of mass    FOOT SURGERY Right    Bone Spurs   IR FLUORO GUIDE CV LINE RIGHT  01/29/2023   IR REMOVAL TUN CV CATH W/O FL  02/08/2023   IR US GUIDE VASC ACCESS RIGHT  01/31/2023   LAPAROSCOPIC HYSTERECTOMY  09/03/2012   Procedure: HYSTERECTOMY TOTAL LAPAROSCOPIC;  Surgeon: Ok Edwards, MD;  Location: [facility];  Service: Gynecology;  Laterality: N/A;   LUMBAR DISC SURGERY     TUBAL LIGATION     UMBILICAL HERNIA REPAIR N/A 05/01/2018   Procedure: UMBILICAL HERNIA REPAIR;  Surgeon: Jimmye Norman, MD;  Location: [facility] OR;  Service: General;  Laterality: N/A;   Patient Active Problem List   Diagnosis Date Noted   CIDP (chronic inflammatory demyelinating polyneuropathy) (HCC) 01/28/2023  Gait abnormality 01/28/2023   Glaucoma 01/28/2023   Pain due to onychomycosis of toenails of both feet 01/21/2023   Wheelchair dependence 12/30/2022   Nerve pain 12/30/2022   Insomnia due to medical condition 12/30/2022   Acute inflammatory demyelinating polyneuropathy (HCC) 10/01/2022   UTI (urinary tract infection) 09/24/2022   Hypomagnesemia 09/24/2022   Weakness 09/23/2022   Hypokalemia 09/23/2022   B12 deficiency 09/05/2022   Folate deficiency 09/05/2022   Carpal tunnel syndrome, left upper limb 05/11/2019    Class: Chronic   Spinal stenosis of lumbar region with neurogenic claudication 08/20/2018   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   Primary osteoarthritis of right knee 02/27/2017    Right tennis elbow 11/11/2016   Muscle cramps 01/15/2016   Bilateral leg edema 01/08/2016   Radiculopathy 12/20/2015   Hyperglycemia 12/21/2014   Mastodynia, female 11/30/2014   S/P excision of fibroadenoma of breast 11/30/2014   Angioedema of lips 04/07/2014   Fibroadenoma of right breast 03/07/2014   Pelvic pain in female 06/19/2012   Menorrhagia 06/11/2012   SUI (stress urinary incontinence, female) 06/11/2012   HTN (hypertension) 06/11/2012   IBS (irritable bowel syndrome) - diarrhea predominant 03/17/2012   MICROSCOPIC HEMATURIA 11/30/2010   BREAST PAIN, RIGHT 11/30/2010   BREAST MASS, RIGHT 01/12/2010   HYPERCHOLESTEROLEMIA 12/07/2007   CIGARETTE SMOKER 12/07/2007   DEGENERATIVE JOINT DISEASE 12/07/2007   Low back pain with sciatica 12/07/2007   HEADACHE 12/07/2007   Obesity, Class III, BMI 40-49.9 (morbid obesity) (HCC) 08/03/2007   Anxiety state 08/03/2007    PCP: Nelwyn Salisbury, MD  REFERRING PROVIDER: Nelwyn Salisbury, MD  REFERRING DIAG: G61.0 (ICD-10-CM) - Guillain Barr syndrome [facility])   THERAPY DIAG:  Muscle weakness (generalized)  Other lack of coordination  Other abnormalities of gait and mobility  Unsteadiness on feet  Guillain Barr syndrome (HCC)  Difficulty in walking, not elsewhere classified  Rationale for Evaluation and Treatment: Rehabilitation  ONSET DATE: 12/05/22  SUBJECTIVE:   SUBJECTIVE STATEMENT: Patient started the IVIG therapy and reports that it was very draining on her, "makes me tired"  She did not attend therapy last week,  She is motivated to walk and "get back to being me"  Reports that the knee has not been bothering her lately  OBJECTIVE STATEMENT: 01/28/23 ASSESSMENT AND PLAN   Dana [name] is a 57 y.o. female   Demyelinating polyradiculoneuropathy             Symptom onset monophasic since September 2024, failed to respond to IVIG in January 2024,             EMG nerve conduction study showed demyelinating features,  no evidence of axonal loss,             Talk with neurohospitalist Dr. Iver Nestle, ED triage, will send her for hospital admission for plasma exchange, please add on following labs, immunofixative protein electrophoresis, ANA with reflex, SSA, SSB titer, iron panel             Starting outpatient IVIG prior authorization process,  Dana [name] is a 57 y.o. female who presented to the [facility] ED on 09/23/2022 with bilateral lower extremity weakness with decreased mobility that has progressed to both arms. She also reported numbness and tingling of the extremities. She was transferred to [facility] for MRI. 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  Inpatient Rehab F/B HHPT.  R knee injection 10/08/22 trigger point injections for worsening back pain 2/15  RA  PAIN:  Are you having pain? Yes: NPRS scale: up to 10/10 Pain location: all over, but her R knee pain is worst,  Pain description: Sharp Aggravating factors: Has difficulty standing up Relieving factors: Medicine helps somewhat  PRECAUTIONS: Fall  WEIGHT BEARING RESTRICTIONS: No  FALLS:  Has patient fallen in last 6 months? No  LIVING ENVIRONMENT: Lives with: lives with their family Lives in: House/apartment Stairs: Nohas a ramp Has following equipment at home: Environmental consultant - 2 wheeled, Wheelchair (power), Tour manager, bed side commode, Ramped entry, and [employer] lift  OCCUPATION: on disability due to RA and a fall several years ago  PLOF: Independent  PATIENT GOALS: walk  NEXT MD VISIT: She is scheduled to follow up with Dr. Serita Sheller with Physical Med/Rehab on 12-30-22. She is scheduled to follow up with Dr. Levert Feinstein at Neurology on 01-28-23.   OBJECTIVE:   DIAGNOSTIC FINDINGS:  Right knee radiographs 11/09/2020   FINDINGS: Severe patellofemoral joint space narrowing. Severe superior and mild inferior patellar degenerative osteophytosis. Moderate superior trochlear degenerative osteophytosis. No joint effusion.  Severe medial compartment joint space narrowing with moderate peripheral medial and lateral compartment degenerative osteophytes.  COGNITION: Overall cognitive status: Within functional limits for tasks assessed     SENSATION: Light touch: Impaired  and B hands and feet and lower legs  EDEMA:  BLE edema noted.  MUSCLE LENGTH: Hamstrings: WFL Thomas test: B tightness, to neutral  POSTURE: rounded shoulders and flexed trunk   PALPATION: No TTP  LOWER EXTREMITY ROM: BLE ROM limited in all planes an all joints due to body habitus   LOWER EXTREMITY MMT:  MMT Right eval Left eval  Hip flexion 3 3-  Hip extension 3- 3-  Hip abduction 3 3-  Hip adduction    Hip internal rotation    Hip external rotation    Knee flexion 4 4-  Knee extension 4- 4-  Ankle dorsiflexion 4- 4-  Ankle plantarflexion    Ankle inversion 4- 4-  Ankle eversion 4- 4-   (Blank rows = not tested)  FUNCTIONAL TESTS:  5 times sit to stand: unable to complete Timed up and go (TUG): unable to complete,  04/22/23  TUG 63 seconds with FWW  Bed Mobility: Occasional min A for sup <> sit, rolled B on mat with great effort, light min A  TRANSFERS: Scooting transfer with CGA, stand pivot with CGA, she reports that she sometimes has difficulty rising due to her knee pain  GAIT: Distance walked: 0' Assistive device utilized:  shopping cart-will bring her RW from home. Level of assistance: Min A Comments: Stood and weight shifted, then stood again and took steps to turn to W/C.  TODAY'S TREATMENT:                                                                                                                              DATE:  04/22/23 Nustep level 3 x 8 minutes TUG 63 seconds with FWW  Leg curls 20# Seated 5# rows and 5# extensions with pulleys Ball b/n knees squeeze Green tband clamshells Seated marches 3x10 3# LAQ 3x10 Ball in lap isometric abs Walking with CGA using FWW x 50 feet  04/08/23 NuStep L3  x 6 minutes Standing on Airex in parallel bars, performed B and ant/post weight shifts over feet, hands hovering over the bar. Stable. B side stepping in parallel bars with Red Tband positioned just above knees to add resistance to hip abd. 2 x 5 each way. Seated W/C push ups x 8 Seated reaching to each side, outside BOS x 5 each.  04/01/23 Nustep level 5 x 8 minutes TUG 65 seconds with FWW and SBA Seated ball b/n knees squeeze Red tband left hip adduction to isolate Seated marches In [employer], hip abduction and extension, some right knee pain with right leg extension In pbars practicing big steps In pbars sides stepping Pball isometric abs Leg curls 20# 2x10 Leg extension small ROM 5# 2x10 Ambulate FWW CGA 2x 20'  03/27/23 Scooting on mat to L, then to R 10 reps each way Seated long kicks, 4#, 2 x 10 each leg 5 x STS re-test-see goals Standing heel raises x 10 Standing alternating taps on 2" step with BUE support, 1 x 5, 1 x 10 reps Lateral step taps on 2" step, 10 reps each Ambulation 1 x 40' with RW and CGA, including turning.  03/25/23 Seated long kicks, 2#, 2 x 10 reps each leg. Supine with legs elevated over physioball-bridge x 10, roll side to side x 10, isometric hip abd and add, 2 x 10 reps Performed bed mobility and squat pivot transfers with S. Fitter press, ULE 1 x 10 each leg, 2 blue bands Ambulation 2 x 20' with RW and CGA, stable.  03/18/23 Seated long kicks with 3# weights, 2 x 10 B. Supine with legs over physioball, bridge x 10 Attempted bridge with hip IR, but she developed a severe L HS cramp.  NuStep L5 x 2 minutes, then 2 x 30 sec fast pedal. Tried to increase resistance, but too painful in R knee. Seated rotational weight shifts, sliding hands out to the side while lifting opposite leg to surface of mat. 3 x 5 reps to each side. Improved stability noted.  03/13/23 Attempted to pedal bike, but it hurt her R knee NuStep-L5 x 4 min, then 3 x 30 sec fast  pedal at L3 Ambulation with RW, CGA 1 x 20', then 1 x 40' including turn around. Standing stepping out and back in all directions, alternating legs, with BUE support on RW. Increased speed and efficiency with repetition. Standing step forward to pick up an object, then back and turn to place it on mat behind her. 4 reps each side. Difficulty performing in L stance without BUE support, but able to perform x 3 with UUE support in RLE SLS.  PATIENT EDUCATION:  Education details: POC Person educated: Patient Education method: Explanation Education comprehension: verbalized understanding  HOME EXERCISE PROGRAM: [id number]  ASSESSMENT:  CLINICAL IMPRESSION: Patient reports that started her new IVIG medication and it really makes her tired and not feel well, she did not attend PT last week due to this.  She does seem a little more lethargic.  She did decrease her TUG time and did well with walking today.  She reports to me that she wants to get back to walking and being like her old self  OBJECTIVE IMPAIRMENTS: Abnormal gait, decreased activity tolerance, decreased  balance, decreased coordination, decreased endurance, difficulty walking, decreased ROM, decreased strength, and postural dysfunction.   ACTIVITY LIMITATIONS: carrying, lifting, bending, standing, squatting, stairs, transfers, and locomotion level  PARTICIPATION LIMITATIONS: meal prep, cleaning, laundry, driving, and shopping  PERSONAL FACTORS: Fitness, Past/current experiences, and 1 comorbidity: Recently diagnosed with GBS  are also affecting patient's functional outcome.   REHAB POTENTIAL: Good  CLINICAL DECISION MAKING: Evolving/moderate complexity  EVALUATION COMPLEXITY: Moderate   GOALS: Goals reviewed with patient? Yes  SHORT TERM GOALS: Target date: 11/28/22 I with initial HEP Baseline: Goal status: met 01/09/23  LONG TERM GOALS: Target date: 03/06/23  I with final HEP Baseline:  Goal status: 04/22/23, ongoing  2.   Decrease 5x STS to < 14 sec Baseline: unable to complete Goal status: 04/22/23-32 sec, ongoing  3.  Decrease TUG to < 20 sec Baseline: Unable to complete. Goal status: 02/18/23 1:55 04/01/23 = 65 seconds Ongoing  04/22/23 63 seconds  4.  Patient will ambulate at least 300' with LRAD, MI, on level and unlevel surfaces. Baseline: Stand pivot transfer with CGA Goal status: 02/18/23-20', RW, CGA, slow, but stable throughout. Ongoing 04/22/23  5.  Perform all bed mobility with I Baseline: Occ min A on mat Goal status: 02/18/23-met  6.  Increase BLE strength to at least 4/5 throughout. Baseline: (3-)-3/5 Goal status: 02/18/23-RLE 4-/5, LLE 3+/5, Ongoing  7. Patient and husband will safely perform car transfers with LRAD Baseline: Currently uses van transportation Goal Status: 02/25/23 met  PLAN:  PT FREQUENCY: 1-2x/week  PT DURATION: 12 weeks  PLANNED INTERVENTIONS: Therapeutic exercises, Therapeutic activity, Neuromuscular re-education, Balance training, Gait training, Patient/Family education, Self Care, Joint mobilization, Stair training, Dry Needling, Electrical stimulation, Cryotherapy, Moist heat, Ultrasound, Ionotophoresis 4mg /ml Dexamethasone, and Manual therapy  PLAN FOR NEXT SESSION:Would like to continue to push her strength, gait and overall function.  She is on a new medication and so far it makes her feel fatigued and lethargic Stacie Glaze, PT 04/22/23 7:44 AM

## 2023-04-22 NOTE — Telephone Encounter (Signed)
Signed orders faxed to [facility]

## 2023-04-24 ENCOUNTER — Ambulatory Visit: Payer: 59 | Admitting: Physical Therapy

## 2023-04-25 ENCOUNTER — Ambulatory Visit: Payer: Medicare Other | Admitting: Podiatry

## 2023-04-29 ENCOUNTER — Ambulatory Visit: Payer: 59 | Admitting: Physical Therapy

## 2023-04-29 ENCOUNTER — Ambulatory Visit: Payer: 59 | Admitting: Occupational Therapy

## 2023-04-30 ENCOUNTER — Ambulatory Visit: Payer: Medicare Other | Admitting: Podiatry

## 2023-05-01 ENCOUNTER — Ambulatory Visit: Payer: 59 | Admitting: Occupational Therapy

## 2023-05-01 ENCOUNTER — Encounter: Payer: Self-pay | Admitting: Physical Therapy

## 2023-05-01 ENCOUNTER — Encounter: Payer: Self-pay | Admitting: Occupational Therapy

## 2023-05-01 ENCOUNTER — Ambulatory Visit: Payer: 59 | Admitting: Physical Therapy

## 2023-05-01 DIAGNOSIS — M6281 Muscle weakness (generalized): Secondary | ICD-10-CM | POA: Diagnosis not present

## 2023-05-01 DIAGNOSIS — R2689 Other abnormalities of gait and mobility: Secondary | ICD-10-CM

## 2023-05-01 DIAGNOSIS — R2681 Unsteadiness on feet: Secondary | ICD-10-CM

## 2023-05-01 DIAGNOSIS — R262 Difficulty in walking, not elsewhere classified: Secondary | ICD-10-CM

## 2023-05-01 DIAGNOSIS — R278 Other lack of coordination: Secondary | ICD-10-CM

## 2023-05-01 DIAGNOSIS — G6181 Chronic inflammatory demyelinating polyneuritis: Secondary | ICD-10-CM

## 2023-05-01 NOTE — Therapy (Signed)
OUTPATIENT PHYSICAL THERAPY LOWER EXTREMITY TREATMENT  Progress Note Reporting Period 02/24/23 to 05/01/23  See note below for Objective Data and Assessment of Progress/Goals.      Patient Name: Dana [name] MRN: [id number] DOB:August 10, 1966, 57 y.o., female Today's Date: 05/01/2023  END OF SESSION:  PT End of Session - 05/01/23 0820     Visit Number 20    Date for PT Re-Evaluation 05/23/23    PT Start Time 0821    PT Stop Time 0845    PT Time Calculation (min) 24 min    Activity Tolerance Patient tolerated treatment well    Behavior During Therapy [name] for tasks assessed/performed               Past Medical History:  Diagnosis Date   Anxiety    Back pain with radiation    Breast mass, right    Chronic pain    Chronic, continuous use of opioids    Complication of anesthesia 04/07/14   Allergic reaction to Lisinopril immediately following surgery   DJD (degenerative joint disease)    Fibromyalgia    Groin abscess    Headache(784.0)    History of IBS    Hypercholesterolemia    IBS (irritable bowel syndrome)    Lactose intolerance    Mild hypertension    Obesity    Tobacco use disorder    Umbilical hernia    Symptomatic   Past Surgical History:  Procedure Laterality Date   ABDOMINAL HYSTERECTOMY     ANTERIOR CERVICAL DECOMP/DISCECTOMY FUSION N/A 12/20/2015   Procedure: ANTERIOR CERVICAL DECOMPRESSION FUSION CERVICAL 4-5, CERVICAL 5-6, CERVICAL 6-7 WITH INSTRUMENTATION AND ALLOGRAFT;  Surgeon: Dana Bamberg, MD;  Location: [facility] OR;  Service: Orthopedics;  Laterality: N/A;  Anterior cervical decompression fusion, cervical 4-5, cervical 5-6, cervical 6-7 with instrumentation and allograft   BACK SURGERY     BILATERAL SALPINGECTOMY  09/03/2012   Procedure: BILATERAL SALPINGECTOMY;  Surgeon: Dana Edwards, MD;  Location: [facility] ORS;  Service: Gynecology;  Laterality: Bilateral;   BREAST BIOPSY Right 04/07/2014   Procedure: REMOVAL RIGHT BREAST MASS WITH WIRE  LOCALIZATION;  Surgeon: Dana Pollack, MD;  Location: [facility] OR;  Service: General;  Laterality: Right;   BREAST EXCISIONAL BIOPSY Left    x2   BREAST LUMPECTOMY     x2   CARPAL TUNNEL RELEASE Left 05/11/2019   Procedure: LEFT CARPAL TUNNEL RELEASE, RIGHT TENNIS ELBOW MARCAINE/DEPO MEDROL INJECTION UNDER ANESTHESIA;  Surgeon: Dana Champagne, MD;  Location: [facility] OR;  Service: Orthopedics;  Laterality: Left;   COLONOSCOPY W/ BIOPSIES  04/24/2012   per Dr. Leone [name], clear, repeat in 10 yrs    disectomy     ESOPHAGOGASTRODUODENOSCOPY     FINGER SURGERY     Right index-excision of mass    FOOT SURGERY Right    Bone Spurs   IR FLUORO GUIDE CV LINE RIGHT  01/29/2023   IR REMOVAL TUN CV CATH W/O FL  02/08/2023   IR US GUIDE VASC ACCESS RIGHT  01/31/2023   LAPAROSCOPIC HYSTERECTOMY  09/03/2012   Procedure: HYSTERECTOMY TOTAL LAPAROSCOPIC;  Surgeon: Dana Edwards, MD;  Location: [facility] ORS;  Service: Gynecology;  Laterality: N/A;   LUMBAR DISC SURGERY     TUBAL LIGATION     UMBILICAL HERNIA REPAIR N/A 05/01/2018   Procedure: UMBILICAL HERNIA REPAIR;  Surgeon: Dana Norman, MD;  Location: [facility] OR;  Service: General;  Laterality: N/A;   Patient Active Problem List   Diagnosis Date Noted  CIDP (chronic inflammatory demyelinating polyneuropathy) (HCC) 01/28/2023   Gait abnormality 01/28/2023   Glaucoma 01/28/2023   Pain due to onychomycosis of toenails of both feet 01/21/2023   Wheelchair dependence 12/30/2022   Nerve pain 12/30/2022   Insomnia due to medical condition 12/30/2022   Acute inflammatory demyelinating polyneuropathy (HCC) 10/01/2022   UTI (urinary tract infection) 09/24/2022   Hypomagnesemia 09/24/2022   Weakness 09/23/2022   Hypokalemia 09/23/2022   B12 deficiency 09/05/2022   Folate deficiency 09/05/2022   Carpal tunnel syndrome, left upper limb 05/11/2019    Class: Chronic   Spinal stenosis of lumbar region with neurogenic claudication 08/20/2018   Congenital deformity of finger  09/12/2017   Primary osteoarthritis of right knee 02/27/2017   Right tennis elbow 11/11/2016   Muscle cramps 01/15/2016   Bilateral leg edema 01/08/2016   Radiculopathy 12/20/2015   Hyperglycemia 12/21/2014   Mastodynia, female 11/30/2014   S/P excision of fibroadenoma of breast 11/30/2014   Angioedema of lips 04/07/2014   Fibroadenoma of right breast 03/07/2014   Pelvic pain in female 06/19/2012   Menorrhagia 06/11/2012   SUI (stress urinary incontinence, female) 06/11/2012   HTN (hypertension) 06/11/2012   IBS (irritable bowel syndrome) - diarrhea predominant 03/17/2012   MICROSCOPIC HEMATURIA 11/30/2010   BREAST PAIN, RIGHT 11/30/2010   BREAST MASS, RIGHT 01/12/2010   HYPERCHOLESTEROLEMIA 12/07/2007   CIGARETTE SMOKER 12/07/2007   DEGENERATIVE JOINT DISEASE 12/07/2007   Low back pain with sciatica 12/07/2007   HEADACHE 12/07/2007   Obesity, Class III, BMI 40-49.9 (morbid obesity) (HCC) 08/03/2007   Anxiety state 08/03/2007    PCP: Dana Salisbury, MD  REFERRING PROVIDER: Nelwyn Salisbury, MD  REFERRING DIAG: G61.0 (ICD-10-CM) - Guillain Barr syndrome [facility])   THERAPY DIAG:  Muscle weakness (generalized)  Other lack of coordination  Unsteadiness on feet  Other abnormalities of gait and mobility  Difficulty in walking, not elsewhere classified  CIDP (chronic inflammatory demyelinating polyneuropathy) (HCC)  Rationale for Evaluation and Treatment: Rehabilitation  ONSET DATE: 12/05/22  SUBJECTIVE:   SUBJECTIVE STATEMENT: Patient reports a lot of stress at this time. She feels stronger after her initial IVIG treatment.  OBJECTIVE STATEMENT: 01/28/23 ASSESSMENT AND PLAN   Dana [name] is a 57 y.o. female   Demyelinating polyradiculoneuropathy             Symptom onset monophasic since September 2024, failed to respond to IVIG in January 2024,             EMG nerve conduction study showed demyelinating features, no evidence of axonal loss,             Talk  with neurohospitalist Dr. Iver [name], ED triage, will send her for hospital admission for plasma exchange, please add on following labs, immunofixative protein electrophoresis, ANA with reflex, SSA, SSB titer, iron panel             Starting outpatient IVIG prior authorization process,  EUNIQUE DENARO is a 57 y.o. female who [facility] the [facility] ED on 09/23/2022 with bilateral lower extremity weakness with decreased mobility that has progressed to both arms. She also reported numbness and tingling of the extremities. She was transferred to [facility] for MRI. 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  Inpatient Rehab F/B HHPT.  R knee injection 10/08/22 trigger point injections for worsening back pain 2/15  RA  PAIN:  Are you having pain? Yes: NPRS scale: up to 10/10 Pain location: all over, but her R knee pain is  worst,  Pain description: Sharp Aggravating factors: Has difficulty standing up Relieving factors: Medicine helps somewhat  PRECAUTIONS: Fall  WEIGHT BEARING RESTRICTIONS: No  FALLS:  Has patient fallen in last 6 months? No  LIVING ENVIRONMENT: Lives with: lives with their family Lives in: House/apartment Stairs: [place] a ramp Has following equipment at home: Environmental consultant - 2 wheeled, Wheelchair (power), Tour manager, bed side commode, Ramped entry, and [employer] lift  OCCUPATION: on disability due to RA and a fall several years ago  PLOF: Independent  PATIENT GOALS: walk  NEXT MD VISIT: She is scheduled to follow up with Dr. Serita Sheller with Physical Med/Rehab on 12-30-22. She is scheduled to follow up with Dr. Levert Feinstein at Neurology on 01-28-23.   OBJECTIVE:   DIAGNOSTIC FINDINGS:  Right knee radiographs 11/09/2020   FINDINGS: Severe patellofemoral joint space narrowing. Severe superior and mild inferior patellar degenerative osteophytosis. Moderate superior trochlear degenerative osteophytosis. No joint effusion. Severe medial compartment joint space  narrowing with moderate peripheral medial and lateral compartment degenerative osteophytes.  COGNITION: Overall cognitive status: Within functional limits for tasks assessed     SENSATION: Light touch: Impaired  and B hands and feet and lower legs  EDEMA:  BLE edema noted.  MUSCLE LENGTH: Hamstrings: WFL Thomas test: B tightness, to neutral  POSTURE: rounded shoulders and flexed trunk   PALPATION: No TTP  LOWER EXTREMITY ROM: BLE ROM limited in all planes an all joints due to body habitus   LOWER EXTREMITY MMT:  MMT Right eval Left eval R 05/01/23 L 05/01/23  Hip flexion 3 3- 4- 3+  Hip extension 3- 3- 3 3  Hip abduction 3 3- 3+ 3+  Hip adduction      Hip internal rotation      Hip external rotation      Knee flexion 4 4- 4 4  Knee extension 4- 4- 4 4  Ankle dorsiflexion 4- 4- 4 4  Ankle plantarflexion      Ankle inversion 4- 4-    Ankle eversion 4- 4-     (Blank rows = not tested)  FUNCTIONAL TESTS:  5 times sit to stand: unable to complete Timed up and go (TUG): unable to complete,  04/22/23  TUG 63 seconds with FWW  Bed Mobility: Occasional min A for sup <> sit, rolled B on mat with great effort, light min A  TRANSFERS: Scooting transfer with CGA, stand pivot with CGA, she reports that she sometimes has difficulty rising due to her knee pain  GAIT: Distance walked: 0' Assistive device utilized:  shopping cart-will bring her RW from home. Level of assistance: Min A Comments: Stood and weight shifted, then stood again and took steps to turn to W/C.  TODAY'S TREATMENT:                                                                                                                              DATE:  05/01/23 NuStep L5 x  5 min then 2 x 30 sec at L7 for power. Strength re-assessment for PN Bed mob MI, Transfers MI Ambulated 1 x 40' with RW and S, improved stability.  04/22/23 Nustep level 3 x 8 minutes TUG 63 seconds with FWW Leg curls 20# Seated 5# rows  and 5# extensions with pulleys Ball b/n knees squeeze Green tband clamshells Seated marches 3x10 3# LAQ 3x10 Ball in lap isometric abs Walking with CGA using FWW x 50 feet  04/08/23 NuStep L3 x 6 minutes Standing on Airex in parallel bars, performed B and ant/post weight shifts over feet, hands hovering over the bar. Stable. B side stepping in parallel bars with Red Tband positioned just above knees to add resistance to hip abd. 2 x 5 each way. Seated W/C push ups x 8 Seated reaching to each side, outside BOS x 5 each.  04/01/23 Nustep level 5 x 8 minutes TUG 65 seconds with FWW and SBA Seated ball b/n knees squeeze Red tband left hip adduction to isolate Seated marches In [employer], hip abduction and extension, some right knee pain with right leg extension In pbars practicing big steps In pbars sides stepping Pball isometric abs Leg curls 20# 2x10 Leg extension small ROM 5# 2x10 Ambulate FWW CGA 2x 20'  PATIENT EDUCATION:  Education details: POC Person educated: Patient Education method: Explanation Education comprehension: verbalized understanding  HOME EXERCISE PROGRAM: [id number]  ASSESSMENT:  CLINICAL IMPRESSION: Patient missed a treatment due to family emergency. She arrived late today due to her ride cancelling last minute. Functional status re-assessed for PN with noted improvement in trunk and LE strength as well as functional mobility.  OBJECTIVE IMPAIRMENTS: Abnormal gait, decreased activity tolerance, decreased balance, decreased coordination, decreased endurance, difficulty walking, decreased ROM, decreased strength, and postural dysfunction.   ACTIVITY LIMITATIONS: carrying, lifting, bending, standing, squatting, stairs, transfers, and locomotion level  PARTICIPATION LIMITATIONS: meal prep, cleaning, laundry, driving, and shopping  PERSONAL FACTORS: Fitness, Past/current experiences, and 1 comorbidity: Recently diagnosed with GBS  are also affecting  patient's functional outcome.   REHAB POTENTIAL: Good  CLINICAL DECISION MAKING: Evolving/moderate complexity  EVALUATION COMPLEXITY: Moderate   GOALS: Goals reviewed with patient? Yes  SHORT TERM GOALS: Target date: 11/28/22 I with initial HEP Baseline: Goal status: met 01/09/23  LONG TERM GOALS: Target date: 03/06/23  I with final HEP Baseline:  Goal status: 04/22/23, ongoing  2.  Decrease 5x STS to < 14 sec Baseline: unable to complete Goal status: 04/22/23-32 sec, ongoing  3.  Decrease TUG to < 20 sec Baseline: Unable to complete. Goal status: 02/18/23 1:55 04/01/23 = 65 seconds Ongoing  04/22/23 63 seconds  4.  Patient will ambulate at least 300' with LRAD, MI, on level and unlevel surfaces. Baseline: Stand pivot transfer with CGA Goal status: 02/18/23-20', RW, CGA, slow, but stable throughout. Ongoing 04/22/23  5.  Perform all bed mobility with I Baseline: Occ min A on mat Goal status: 02/18/23-met  6.  Increase BLE strength to at least 4/5 throughout. Baseline: (3-)-3/5 Goal status: 05/01/23-See MMT, ongoing.  7. Patient and husband will safely perform car transfers with LRAD Baseline: Currently uses van transportation Goal Status: 02/25/23 met  PLAN:  PT FREQUENCY: 1-2x/week  PT DURATION: 12 weeks  PLANNED INTERVENTIONS: Therapeutic exercises, Therapeutic activity, Neuromuscular re-education, Balance training, Gait training, Patient/Family education, Self Care, Joint mobilization, Stair training, Dry Needling, Electrical stimulation, Cryotherapy, Moist heat, Ultrasound, Ionotophoresis 4mg /ml Dexamethasone, and Manual therapy  PLAN FOR NEXT SESSION:Would like to continue to push  her strength, gait and overall function.  She is on a new medication and so far it makes her feel fatigued and lethargic  Oley Balm DPT 05/01/23 9:30 AM

## 2023-05-01 NOTE — Therapy (Signed)
OUTPATIENT OCCUPATIONAL THERAPY NEURO TREATMENT   Patient Name: Dana [name] MRN: [id number] DOB:1965-10-05, 57 y.o., female Today's Date: 05/01/2023  PCP: Dr. Clent Ridges REFERRING PROVIDER: Dr. Gershon Crane  END OF SESSION:  OT End of Session - 05/01/23 0843     Visit Number 21    Number of Visits 35    Date for OT Re-Evaluation 05/13/23    Authorization Type UHC, Medicare    Authorization - Visit Number 30    Progress Note Due on Visit 30    OT Start Time 0845    OT Stop Time 0930    OT Time Calculation (min) 45 min    Activity Tolerance Patient tolerated treatment well    Behavior During Therapy [facility] for tasks assessed/performed                     Past Medical History:  Diagnosis Date   Anxiety    Back pain with radiation    Breast mass, right    Chronic pain    Chronic, continuous use of opioids    Complication of anesthesia 04/07/14   Allergic reaction to Lisinopril immediately following surgery   DJD (degenerative joint disease)    Fibromyalgia    Groin abscess    Headache(784.0)    History of IBS    Hypercholesterolemia    IBS (irritable bowel syndrome)    Lactose intolerance    Mild hypertension    Obesity    Tobacco use disorder    Umbilical hernia    Symptomatic   Past Surgical History:  Procedure Laterality Date   ABDOMINAL HYSTERECTOMY     ANTERIOR CERVICAL DECOMP/DISCECTOMY FUSION N/A 12/20/2015   Procedure: ANTERIOR CERVICAL DECOMPRESSION FUSION CERVICAL 4-5, CERVICAL 5-6, CERVICAL 6-7 WITH INSTRUMENTATION AND ALLOGRAFT;  Surgeon: Estill Bamberg, MD;  Location: [facility] OR;  Service: Orthopedics;  Laterality: N/A;  Anterior cervical decompression fusion, cervical 4-5, cervical 5-6, cervical 6-7 with instrumentation and allograft   BACK SURGERY     BILATERAL SALPINGECTOMY  09/03/2012   Procedure: BILATERAL SALPINGECTOMY;  Surgeon: Ok Edwards, MD;  Location: [facility];  Service: Gynecology;  Laterality: Bilateral;   BREAST BIOPSY Right 04/07/2014    Procedure: REMOVAL RIGHT BREAST MASS WITH WIRE LOCALIZATION;  Surgeon: Adolph Pollack, MD;  Location: [facility] OR;  Service: General;  Laterality: Right;   BREAST EXCISIONAL BIOPSY Left    x2   BREAST LUMPECTOMY     x2   CARPAL TUNNEL RELEASE Left 05/11/2019   Procedure: LEFT CARPAL TUNNEL RELEASE, RIGHT TENNIS ELBOW MARCAINE/DEPO MEDROL INJECTION UNDER ANESTHESIA;  Surgeon: Kerrin Champagne, MD;  Location: [facility] OR;  Service: Orthopedics;  Laterality: Left;   COLONOSCOPY W/ BIOPSIES  04/24/2012   per Dr. Leone Payor, clear, repeat in 10 yrs    disectomy     ESOPHAGOGASTRODUODENOSCOPY     FINGER SURGERY     Right index-excision of mass    FOOT SURGERY Right    Bone Spurs   IR FLUORO GUIDE CV LINE RIGHT  01/29/2023   IR REMOVAL TUN CV CATH W/O FL  02/08/2023   IR US GUIDE VASC ACCESS RIGHT  01/31/2023   LAPAROSCOPIC HYSTERECTOMY  09/03/2012   Procedure: HYSTERECTOMY TOTAL LAPAROSCOPIC;  Surgeon: Ok Edwards, MD;  Location: [facility];  Service: Gynecology;  Laterality: N/A;   LUMBAR DISC SURGERY     TUBAL LIGATION     UMBILICAL HERNIA REPAIR N/A 05/01/2018   Procedure: UMBILICAL HERNIA REPAIR;  Surgeon: Jimmye Norman, MD;  Location: [facility] OR;  Service: General;  Laterality: N/A;   Patient Active Problem List   Diagnosis Date Noted   CIDP (chronic inflammatory demyelinating polyneuropathy) (HCC) 01/28/2023   Gait abnormality 01/28/2023   Glaucoma 01/28/2023   Pain due to onychomycosis of toenails of both feet 01/21/2023   Wheelchair dependence 12/30/2022   Nerve pain 12/30/2022   Insomnia due to medical condition 12/30/2022   Acute inflammatory demyelinating polyneuropathy (HCC) 10/01/2022   UTI (urinary tract infection) 09/24/2022   Hypomagnesemia 09/24/2022   Weakness 09/23/2022   Hypokalemia 09/23/2022   B12 deficiency 09/05/2022   Folate deficiency 09/05/2022   Carpal tunnel syndrome, left upper limb 05/11/2019    Class: Chronic   Spinal stenosis of lumbar region with neurogenic  claudication 08/20/2018   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   Primary osteoarthritis of right knee 02/27/2017   Right tennis elbow 11/11/2016   Muscle cramps 01/15/2016   Bilateral leg edema 01/08/2016   Radiculopathy 12/20/2015   Hyperglycemia 12/21/2014   Mastodynia, female 11/30/2014   S/P excision of fibroadenoma of breast 11/30/2014   Angioedema of lips 04/07/2014   Fibroadenoma of right breast 03/07/2014   Pelvic pain in female 06/19/2012   Menorrhagia 06/11/2012   SUI (stress urinary incontinence, female) 06/11/2012   HTN (hypertension) 06/11/2012   IBS (irritable bowel syndrome) - diarrhea predominant 03/17/2012   MICROSCOPIC HEMATURIA 11/30/2010   BREAST PAIN, RIGHT 11/30/2010   BREAST MASS, RIGHT 01/12/2010   HYPERCHOLESTEROLEMIA 12/07/2007   CIGARETTE SMOKER 12/07/2007   DEGENERATIVE JOINT DISEASE 12/07/2007   Low back pain with sciatica 12/07/2007   HEADACHE 12/07/2007   Obesity, Class III, BMI 40-49.9 (morbid obesity) (HCC) 08/03/2007   Anxiety state 08/03/2007    ONSET DATE: 12/05/22- referral date  REFERRING DIAG: G61.0 (ICD-10-CM) - Guillain Barr syndrome (HCC)  THERAPY DIAG:    Rationale for Evaluation and Treatment: Rehabilitation  SUBJECTIVE:   SUBJECTIVE STATEMENT: Pt reports no pain PERTINENT HISTORY: Pt is a 57 y/o female hospitalized 09/23/22  with progressive weakness and sensation changes, consistent with Guillain-Barr syndrome. MRI negative. LP results and assessment most consistent with Guillain-Barr syndrome.  Pt was d/c home from CIR 11/06/22 PMH includes: glaucoma anxiety, DJD, fibromyalgia, HTN, tobacco use, B-12 deficiency, OA, ACDF 2017.  Pt was re-hospitalized 01/28/2456 with CIDP (chronic inflammatory demyelinating polyneuropathy). Saw Neuro on day of admission, NCS showed demyelination, referred to hospital for PLEX. Initially diagnosed with an acute demyelinating neuropathy Jan 2024, treated with IVIG. PMH: Spinal stenosis, HTN,  Obesity   PRECAUTIONS: Fall  WEIGHT BEARING RESTRICTIONS: no  PAIN: no    FALLS: Has patient fallen in last 6 months? No  LIVING ENVIRONMENT: Lives with: lives with their spouse Lives in: House/apartment Stairs: has ramp built Has following equipment at home:   BSC, steadi,  tub bench but can't access bathroom   PLOF: Independent  PATIENT GOALS: increase independence   OBJECTIVE:   HAND DOMINANCE: Right  ADLs: from initial eval Overall ADLs: assistance required, unable to access BR Transfers/ambulation related to ADLs: Eating: unable to cut food Grooming: set up UB Dressing: set up for bra and shirt LB Dressing: max A Toileting: uses BSC uses steadi Bathing: min-mod A in hospital  bed  Tub Shower transfers: n/a Equipment: Emergency planning/management officer  IADLs:dependent for IADLS   MOBILITY STATUS:  uses w/c  , per PT report minguard for transfer from w/c to mat    ACTIVITY TOLERANCE: Activity tolerance: Pt fatigues quickly    UPPER EXTREMITY ROM:  see updates for 02/18/23 re-eval   Active ROM Right 02/18/23 Left 02/18/23  Shoulder flexion 130 120  Shoulder abduction 90 90  Shoulder adduction    Shoulder extension    Shoulder internal rotation    Shoulder external rotation    Elbow flexion    Elbow extension  -5  Wrist flexion    Wrist extension    Wrist ulnar deviation    Wrist radial deviation    Wrist pronation    Wrist supination    (Blank rows = not tested)  UPPER EXTREMITY MMT:   see below for re-eval 02/18/23  MMT Right 02/18/23 Left 02/18/23  Shoulder flexion 3+/5 3+/5  Shoulder abduction    Shoulder adduction    Shoulder extension    Shoulder internal rotation    Shoulder external rotation    Middle trapezius    Lower trapezius    Elbow flexion 4/5 4/5  Elbow extension 4-/5 4-/5  Wrist flexion    Wrist extension    Wrist ulnar deviation    Wrist radial deviation    Wrist pronation    Wrist supination      HAND FUNCTION: Grip strength:  Right: 32 lbs; Left: 18 lbs, 02/18/23 R 55 lbs, L 35 lbs  COORDINATION: 9 Hole Peg test:initial eval  Right: 1 min 48 sec; Left: 4 pegs in 2 mins  Box and Blocks:  Right 35 blocks, Left  26 blocks 02/18/23- RUE 35.46, LUE 65 secs  SENSATION: Light touch: WFL Hot/Cold: Not tested    COGNITION: Overall cognitive status:NT   VISION: Subjective report: reports hx of glaucoma VISION ASSESSMENT: Not tested  OBSERVATIONS: Pt is eager to improve, pt's husband is very supportive   TODAY'S TREATMENT:                                                                                                                              DATE:05/01/23- Arm bike x 6 mins level 2 for conditioning, pt walked to and from arm bike with walker and supervision. Placing and removing grooved pegs from pegboard with LUE for increased fine motor coordination, removing with in hand manipulation, min difficulty/ drops and v.c Placing graded clothespins 1-8# on vertical antennae with LUE then removing with RUE for sustained pinch, min v.c  Gripper set at level 2, pt picked up blocks with LUE for sustained grip, min difficulty/ drops   04/22/23:  This progress update covers dates of service from 6/4-04/22/23.   Started session working on LUE coordination.  Copying small peg patterns.  Patient with frequent drops, difficulty orienting small pegs in hand.  Patient with report of diminished sensation in left hand.  Attempted same task with left hand in glove with slightly increased ability initially - however - not able to sustain.   Worked on hand strengthening with gripper level 2 x 20 blocks.  Faciliatation an dcueing to reduce trunk lateral flexion and shoulder elevation.  Attempted 5 blocks at  level 3 - fatigued after 3 blocks.   Standing balance/ tolerance at counter - working toward reducing UE reliance - reaching into chest high shelf, reaching to shelf at eye level.  Stand with walker and taking slow cautious steps to  counter.    04/08/23-Pt ambulated approximately 5 feet to walk to arm bike with rolling walker with close supervision. Arm bike x 6 mins level 1 for conditioning Min a for sit to stand from armbike then pt ambulated back to w/c. Seated in w/c closed chain shoulder flexion, with unweighted ball followed by shoulder flexion then trunk rotation and chest press wih 2.2 lbs weighted ball for UE strength and endurance, rest between sets. Fine motor coordination task to copy small peg design with LUE, min-mod difficulty, v.c to avoid shoulder hike/ compensation.  04/01/23- Seated closed chain shoulder flexion, with unweighted ball followed by shoulder flexion then trunk rotation and chest press wih 2.2 lbs weighted ball. Rest in between sets seated on mat. Pt walker to mat with walker and supervision. Standing at countertop functional reaching to place graded clothespins in targets with RUE, rest break then pt removed with left hand, close supervision. Discussed plans to continue OT after next week. Pt walked mat to w/c with walker and close supervision .   03/27/23-Theraband exercises for biceps curls, triceps, rowing  and shoulder extension with green band and shoulder abduction with red band 10-15 reps each, min v.c, rest break in between sets Standing at countertop to place and remove items from lowest overhead shelf, seated rest break prior to replacing, close supervision for moblity with walker  Gripper set at level 2 sustained grip to pick up 1 inch blocks, 1/2 with RUE with min difficulty and 1/2 with left with min-mod difficulty for sustained grip Graded clothespins for sustained pinch 1-8# using left and right UE's for sustained pinch, RUE only with black clothespins dur to difficulty. Therapist recommends that pt orders a long handled sponge to apply lotion onto legs. Pt with continued bilateral LE swelling.  03/25/23 Pt is hoarse today and reports a tickling in her throat as well as LE swelling.  Therapist recommends pt calls MD.Pt arrived late as her transportation did not pick her up. Arm bike x 6 mins level 1 for conditioning.Pt transferred to and from arm bike to w/c with min A. Copying small peg design for increased fine motor coordination with LUE, min difficulty and increased time, removing with in hand manipulation. Pt demonstrates improved fine motor coordination.  03/18/23- Pt amb across gym with RW and minguard to arm bike.  Arm bike x 7 mins level 1  for conditioning Placing small pegs into pegboard with LUE for increased fine motor coordination, pt with improved performance and coordination today. Pt walked 3 ft from chair to counter and stood to place and remove items from overhead shelves with 1 seated rest break. Discussion with patient regarding her home situation,. Pt reports frustration and she feels like she is a burden on husband. Therapist recommends pt seeks counseling. Shoulder abduction  with red band biceps curls with green band  20 reps for UE strengthening   03/13/23-Pt reports she had a stomach bug and she wasn't feeling well. Placing large pegs into pegboard with LUE with occasional min difficulty, then removing with 3 pt pinch min v.c to avoid shoulder hike. Red theraband exercises for shoulder abduction, rowing and extension 15 reps each,  green band for biceps curls and triceps extension 15 reps each, min v.c  Arm bike x 6 mins level 3 for conditioning, pt transfered to and from with min A Discussed safety for cooking and recommendation that if pt decides to fry fish, she performs with someone for safety, and she keeps her LUE away from hot surfaces due to sensory deficits. Pt verbalized understanding.   03/04/23 Red theraband exercises for shoulder abduction, rowing and extension 20 reps each, upgraded to green band for biceps curls and triceps extension 20 reps each, min v.c Arm bike x 8 mins, 4 mins each direction, level 2 for conditioning, pt transferred to  and from seat with supervision. Coordination activities with LUE: flipping and dealing cards, min difficulty/ v.c  02/27/23-Arm bike x 6 mins level 2 for conditioning (Pt transfers to and from w/c with supervision) Placing large pegs into pegboard with LUE then removing for increased fine motor coordination, min difficulty/ v.c  Standing at countertop with supervision to place and remove items with right then left UE's, pt stood x 3 for 30 secs to 1 min 15 secs, rest breaks in between Gripper set at level 1 to pick up 1 inch blocks  with right and left UE's for sustained grip, min difficulty Pt reports amb to BR with walker without assist, therapist recommends supervision for safety.  02/25/23-Arm bike x 6 mins level 2 for conditioning, Pt transferred to the seat for the first time with supervision Reviewed previously issued red theraband exercises for UE strength 20 reps each, min v.c  Fine motor coordination exercises with left and right UE's, flipping playing cards and stacking coins with min difficulty using RUE, picking up coins and placing in container with LUE and flipping cards mod difficulty. Pt attempted placing small pegs into a pegboard with bilateral UE's max difficulty with LUE, mod difficulty with RUE, task was discontinued. Pt reports being a little off. She took her meds without eating and got into a disagreement with transportation this a.m.    02/18/23- re-eval performed and therapist discussed potential goals with patient.  PATIENT EDUCATION: Education details:   see above Person educated: Patient Education method: explanation, demonstration Education comprehension: verbalized understanding, returned demonstration.  HOME EXERCISE PROGRAM:  12/17/22- coordination HEP, cane exercises seated   GOALS: Goals reviewed with patient? Yes  SHORT TERM GOALS: Target date: 03/20/23  I with initial HEP Baseline: Goal status: met 01/21/23  2.  Pt will demonstrate improved UE  functional use as evidenced increasing bilateral box/ blocks score by 4 blocks.  Baseline: R 35, L 26 Goal status  met 12/31/22 RUE :43:  LUE 35    3.  Pt will donn pants with mod A. Baseline: max A Goal status:met  02/18/23- pt performed mod I today  4. Pt will verbalize understanding of adapted strategies to maximize safety and I with ADLs/ IADLs .  Goal status:  ongoing, has tub bench and long handled sponge  8/6  ongoing - patient relies on husband to lift legs into tub - encouraging patient to increase participation with IADL at home.    5.  Pt will demonstrate ability to stand for functional activity with min A x 10 mins in prep for ADLs.  Goal status: not consistent, pt stood for 1 min on 02/18/23 8/6:  Stood x 2 min in clinic - patient reporting increased fatigue due to IVIG treatment.    6.  Pt will perform w/c to [facility] transfers mod I- 8/6- patient reports mod I - Need to assess in clinic- ongoing  Goal status:  met, 02/18/23 7. I with updated HEP  Goal status- ongoing 03/18/23  8/6 - goal met - yellow theraband.     LONG TERM GOALS: Target date: 05/13/23 I with updated HEP  Goal status: ongoing, 02/18/23  2.  Pt will perform LB dressing mod I. Baseline:max A Goal status:met 03/25/23- 8/6- patient indicates she is dressing herself.  Needs verification  3.  Pt will bathe with supervision/ set up. Baseline: min-mod A for sponge bath Goal status: met for sponge bathing at set up 8/6 - min assist shower  4.  Pt will perform basic cooking at a w/c level mod I  Revised goal: Pt will perform basic cooking modified independently at a walker level.  Goal status:  met, 02/18/23  pt has performed simple tasks mod I w/c level, goal revised - Ongoing, pt is currently performing at a w/c level-03/25/23 8/6 ongoing - not walking in kitchen  5.  Pt will demonstrate improved fine motor coordination as evidenced by performing LUE 9 hole peg test in 2 mins or less.  Revised goal:Pt will  demonstrate improved fine motor coordination as evidenced by performing 9 hole peg test in 55 secs or less 8/6- 63 seconds- ongoing  Goal status: initial goal met 6/4/246/4/24-  LUE 65 secs , goal revised  6.  Pt will demonstrate improved fine motor coordination as evidenced by performing RUE 9 hole peg test in 1 mins  30 secs or less.  Baseline: RUE  1 min 48 secs, LUE 4 pegs placed in 2 mins  Goal status: met 6/4/246/4/24- RUE 35.46,     7. Pt will perform shower with supervision using tub transfer bench    Goal status: ongoing min A 03/25/23  8/6-ongoing min   8. Pt will perform basic home management at a walker level mod I    Goal status: ongoing, 03/25/23  ongoing 8/6- ongoing ASSESSMENT:  CLINICAL IMPRESSION: Pt demonstrates improving fine motor coordination and grip strength. PERFORMANCE DEFICITS: in functional skills including ADLs, IADLs, coordination, dexterity, sensation, ROM, strength, flexibility, Fine motor control, Gross motor control, mobility, balance, endurance, decreased knowledge of precautions, decreased knowledge of use of DME, and UE functional use, cognitive skills including  and psychosocial skills including coping strategies, environmental adaptation, habits, interpersonal interactions, and routines and behaviors.   IMPAIRMENTS: are limiting patient from ADLs, IADLs, rest and sleep, play, leisure, and social participation.   CO-MORBIDITIES: may have co-morbidities  that affects occupational performance. Patient will benefit from skilled OT to address above impairments and improve overall function.  MODIFICATION OR ASSISTANCE TO COMPLETE EVALUATION: No modification of tasks or assist necessary to complete an evaluation.  OT OCCUPATIONAL PROFILE AND HISTORY: Detailed assessment: Review of records and additional review of physical, cognitive, psychosocial history related to current functional performance.  CLINICAL DECISION MAKING: LOW - limited treatment options, no task  modification necessary  REHAB POTENTIAL: Good  EVALUATION COMPLEXITY: Low    PLAN:  OT FREQUENCY: 2x/week  OT DURATION: 12 weeks  PLANNED INTERVENTIONS: self care/ADL training, therapeutic exercise, therapeutic activity, neuromuscular re-education, manual therapy, passive range of motion, gait training, balance training, stair training, functional mobility training, aquatic therapy, ultrasound, paraffin, moist heat, cryotherapy, patient/family education, energy conservation, coping strategies training, DME and/or AE instructions, and Re-evaluation  RECOMMENDED OTHER SERVICES: PT  CONSULTED AND AGREED WITH PLAN OF CARE: Patient and family member/caregiver  PLAN FOR NEXT SESSION: work towards goals,  standing tolerance, transfers, UE strength and coordination, ADL strategies  ,, OT 05/01/2023, 9:47 AM

## 2023-05-06 ENCOUNTER — Encounter: Payer: 59 | Admitting: Occupational Therapy

## 2023-05-08 ENCOUNTER — Encounter: Payer: Self-pay | Admitting: Physical Therapy

## 2023-05-08 ENCOUNTER — Encounter: Payer: Self-pay | Admitting: Family Medicine

## 2023-05-08 ENCOUNTER — Telehealth (INDEPENDENT_AMBULATORY_CARE_PROVIDER_SITE_OTHER): Payer: 59 | Admitting: Family Medicine

## 2023-05-08 ENCOUNTER — Ambulatory Visit: Payer: 59 | Admitting: Occupational Therapy

## 2023-05-08 ENCOUNTER — Ambulatory Visit: Payer: 59 | Admitting: Physical Therapy

## 2023-05-08 DIAGNOSIS — R2681 Unsteadiness on feet: Secondary | ICD-10-CM

## 2023-05-08 DIAGNOSIS — R278 Other lack of coordination: Secondary | ICD-10-CM

## 2023-05-08 DIAGNOSIS — R262 Difficulty in walking, not elsewhere classified: Secondary | ICD-10-CM

## 2023-05-08 DIAGNOSIS — Z0000 Encounter for general adult medical examination without abnormal findings: Secondary | ICD-10-CM

## 2023-05-08 DIAGNOSIS — R2689 Other abnormalities of gait and mobility: Secondary | ICD-10-CM

## 2023-05-08 DIAGNOSIS — M6281 Muscle weakness (generalized): Secondary | ICD-10-CM | POA: Diagnosis not present

## 2023-05-08 DIAGNOSIS — G6181 Chronic inflammatory demyelinating polyneuritis: Secondary | ICD-10-CM

## 2023-05-08 NOTE — Progress Notes (Signed)
PATIENT CHECK-IN and HEALTH RISK ASSESSMENT QUESTIONNAIRE:  -completed by phone/video for upcoming Medicare Preventive Visit  Pre-Visit Check-in: 1)Vitals (height, wt, BP, etc) - record in vitals section for visit on day of visit Request home vitals (wt, BP, etc.) and enter into vitals, THEN update Vital Signs SmartPhrase below at the top of the HPI. See below.  2)Review and Update Medications, Allergies PMH, Surgeries, Social history in Epic 3)Hospitalizations in the last year with date/reason? Yes   4)Review and Update Care Team (patient's specialists) in Epic 5) Complete PHQ9 in Epic  6) Complete Fall Screening in Epic 7)Review all Health Maintenance Due and order under PCP if not done.  8)Medicare Wellness Questionnaire: Answer theses question about your habits: Do you drink alcohol? No  If yes, how many drinks do you have a day?N/A  Have you ever smoked?Yes  Quit date if applicable? 2015  How many packs a day do/did you smoke?  Less than 1 pack  Do you use smokeless tobacco?No Do you use an illicit drugs? No Do you exercises?  Yes IF so, what type and how many days/minutes per week?2 days 60 mins  Are you sexually active? No Number of partners?N/A Typical breakfast: Varies  Typical lunch: Sandwich Typical dinner: Varies  Typical snacks: Gummy bears   Beverages: N/A  Answer theses question about you: Can you perform most household chores?No Do you find it hard to follow a conversation in a noisy room?No Do you often ask people to speak up or repeat themselves?No Do you feel that you have a problem with memory? No Do you balance your checkbook and or bank acounts?Yes Do you feel safe at home? Yes  Last dentist visit? 2015 Do you need assistance with any of the following: Please note if so - see below  Driving? Yes   Feeding yourself? No  Getting from bed to chair? No  Getting to the toilet? No  Bathing or showering? No  Dressing yourself? No  Managing money?  No  Climbing a flight of stairs Yes   Preparing meals? No  Do you have Advanced Directives in place (Living Will, Healthcare Power or Attorney)? No   Last eye Exam and location? Last year   Do you currently use prescribed or non-prescribed narcotic or opioid pain medications? Yes   Do you have a history or close family history of breast, ovarian, tubal or peritoneal cancer or a family member with BRCA (breast cancer susceptibility 1 and 2) gene mutations? No  Request home vitals (wt, BP, etc.) and enter into vitals, THEN update Vital Signs SmartPhrase below at the top of the HPI. See below.   Nurse/Assistant Credentials/time stamp: MG 5:01 PM   ----------------------------------------------------------------------------------------------------------------------------------------------------------------------------------------------------------------------  Vital Signs: Vital signs are patient reported.   MEDICARE ANNUAL PREVENTIVE VISIT WITH PROVIDER: (Welcome to Medicare, initial annual wellness or annual wellness exam)  Virtual Visit via Phone Note  I connected with Dana [name] on 05/08/23 by phone and verified that I am speaking with the correct person using two identifiers.  Location patient: home Location provider:work or home office Persons participating in the virtual visit: patient, provider  Concerns and/or follow up today: stable, doing PT on regular basis.    See HM section in Epic for other details of completed HM.    ROS: negative for report of fevers, unintentional weight loss, vision changes, vision loss, hearing loss or change, chest pain, sob, hemoptysis, melena, hematochezia, hematuria, falls, bleeding or bruising, thoughts of suicide or self harm, memory  loss  Patient-completed extensive health risk assessment - reviewed and discussed with the patient: See Health Risk Assessment completed with patient prior to the visit either above or in recent phone  note. This was reviewed in detailed with the patient today and appropriate recommendations, orders and referrals were placed as needed per Summary below and patient instructions.   Review of Medical History: -PMH, PSH, Family History and current specialty and care providers reviewed and updated and listed below   Patient Care Team: Nelwyn Salisbury, MD as PCP - General (Family Medicine)   Past Medical History:  Diagnosis Date   Anxiety    Back pain with radiation    Breast mass, right    Chronic pain    Chronic, continuous use of opioids    Complication of anesthesia 04/07/14   Allergic reaction to Lisinopril immediately following surgery   DJD (degenerative joint disease)    Fibromyalgia    Groin abscess    Headache(784.0)    History of IBS    Hypercholesterolemia    IBS (irritable bowel syndrome)    Lactose intolerance    Mild hypertension    Obesity    Tobacco use disorder    Umbilical hernia    Symptomatic    Past Surgical History:  Procedure Laterality Date   ABDOMINAL HYSTERECTOMY     ANTERIOR CERVICAL DECOMP/DISCECTOMY FUSION N/A 12/20/2015   Procedure: ANTERIOR CERVICAL DECOMPRESSION FUSION CERVICAL 4-5, CERVICAL 5-6, CERVICAL 6-7 WITH INSTRUMENTATION AND ALLOGRAFT;  Surgeon: Estill Bamberg, MD;  Location: [facility] OR;  Service: Orthopedics;  Laterality: N/A;  Anterior cervical decompression fusion, cervical 4-5, cervical 5-6, cervical 6-7 with instrumentation and allograft   BACK SURGERY     BILATERAL SALPINGECTOMY  09/03/2012   Procedure: BILATERAL SALPINGECTOMY;  Surgeon: Ok Edwards, MD;  Location: [facility] OR[facility];  Service: Gynecology;  Laterality: Bilateral;   BREAST BIOPSY Right 04/07/2014   Procedure: REMOVAL RIGHT BREAST MASS WITH WIRE LOCALIZATION;  Surgeon: Adolph Pollack, MD;  Location: [facility] OR;  Service: General;  Laterality: Right;   BREAST EXCISIONAL BIOPSY Left    x2   BREAST LUMPECTOMY     x2   CARPAL TUNNEL RELEASE Left 05/11/2019   Procedure: LEFT CARPAL  TUNNEL RELEASE, RIGHT TENNIS ELBOW MARCAINE/DEPO MEDROL INJECTION UNDER ANESTHESIA;  Surgeon: Kerrin Champagne, MD;  Location: [facility] OR;  Service: Orthopedics;  Laterality: Left;   COLONOSCOPY W/ BIOPSIES  04/24/2012   per Dr. Leone Payor, clear, repeat in 10 yrs    disectomy     ESOPHAGOGASTRODUODENOSCOPY     FINGER SURGERY     Right index-excision of mass    FOOT SURGERY Right    Bone Spurs   IR FLUORO GUIDE CV LINE RIGHT  01/29/2023   IR REMOVAL TUN CV CATH W/O FL  02/08/2023   IR US GUIDE VASC ACCESS RIGHT  01/31/2023   LAPAROSCOPIC HYSTERECTOMY  09/03/2012   Procedure: HYSTERECTOMY TOTAL LAPAROSCOPIC;  Surgeon: Ok Edwards, MD;  Location: [facility];  Service: Gynecology;  Laterality: N/A;   LUMBAR DISC SURGERY     TUBAL LIGATION     UMBILICAL HERNIA REPAIR N/A 05/01/2018   Procedure: UMBILICAL HERNIA REPAIR;  Surgeon: Jimmye Norman, MD;  Location: [facility] OR;  Service: General;  Laterality: N/A;    Social History   Socioeconomic History   Marital status: Married    Spouse name: Dekayla Feland   Number of children: 4   Years of education: Not on file   Highest education level: Not on  file  Occupational History   Occupation: Psychologist, educational  Tobacco Use   Smoking status: Former    Types: Cigarettes   Smokeless tobacco: Never   Tobacco comments:    quit March 2017  Vaping Use   Vaping status: Never Used  Substance and Sexual Activity   Alcohol use: Not Currently   Drug use: Not Currently    Comment: chronic use of dilaudid   Sexual activity: Not Currently    Birth control/protection: Surgical  Other Topics Concern   Not on file  Social History Narrative   Married - lives with husband 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, fashion/clothing    4 grown children does not live with her - 1985, 56, 71 (twins)Grandchildren - 4Updated 10/12/2013   Right handed   Caffeine-none   Social Determinants of Health   Financial Resource Strain: Low Risk  (05/06/2022)   Overall Financial Resource Strain  (CARDIA)    Difficulty of Paying Living Expenses: Not hard at all  Food Insecurity: No Food Insecurity (01/28/2023)   Hunger Vital Sign    Worried About Running Out of Food in the Last Year: Never true    Ran Out of Food in the Last Year: Never true  Transportation Needs: No Transportation Needs (01/28/2023)   PRAPARE - Administrator, Civil Service (Medical): No    Lack of Transportation (Non-Medical): No  Physical Activity: Inactive (05/06/2022)   Exercise Vital Sign    Days of Exercise per Week: 0 days    Minutes of Exercise per Session: 0 min  Stress: No Stress Concern Present (05/06/2022)   [employer] of Occupational Health - Occupational Stress Questionnaire    Feeling of Stress : Not at all  Social Connections: Unknown (01/25/2022)   [facility]   Social Network    Social Network: Not on file  Intimate Partner Violence: Not At Risk (01/28/2023)   Humiliation, Afraid, Rape, and Kick questionnaire    Fear of Current or Ex-Partner: No    Emotionally Abused: No    Physically Abused: No    Sexually Abused: No    Family History  Problem Relation Age of Onset   Diabetes Mother    Diabetes Father    Breast cancer Maternal Aunt 46   Prostate cancer Paternal Grandfather     Current Outpatient Medications on File Prior to Visit  Medication Sig Dispense Refill   ALPRAZolam (XANAX) 0.5 MG tablet Take 1 tablet (0.5 mg total) by mouth 2 (two) times daily as needed for anxiety. 180 tablet 1   Cyanocobalamin (VITAMIN B-12 PO) Take 1 tablet by mouth daily.     cyclobenzaprine (FLEXERIL) 10 MG tablet Take 1 tablet (10 mg total) by mouth 3 (three) times daily. (Patient taking differently: Take 10 mg by mouth 3 (three) times daily as needed for muscle spasms.) 90 tablet 5   dorzolamide-timolol (COSOPT) 2-0.5 % ophthalmic solution Place 1 drop into both eyes 2 (two) times daily. 10 mL 1   famotidine (PEPCID) 20 MG tablet Take 20 mg by mouth 2 (two) times daily.      fluticasone (FLONASE) 50 MCG/ACT nasal spray SPRAY 2 SPRAYS INTO EACH NOSTRIL EVERY DAY 16 mL 11   folic acid (FOLVITE) 1 MG tablet Take 1 tablet (1 mg total) by mouth daily. 90 tablet 3   furosemide (LASIX) 40 MG tablet Take 1 tablet (40 mg total) by mouth 2 (two) times daily. 180 tablet 3   gabapentin (NEURONTIN) 300 MG capsule Take 3  capsules (900 mg total) by mouth 3 (three) times daily. 270 capsule 5   hydrocerin (EUCERIN) CREA Apply 1 Application topically 2 (two) times daily.  0   ibuprofen (ADVIL) 200 MG tablet Take 400 mg by mouth 2 (two) times daily as needed for headache or moderate pain.     latanoprost (XALATAN) 0.005 % ophthalmic solution Place 1 drop into both eyes at bedtime. 2.5 mL 1   magnesium oxide (MAG-OX) 400 MG tablet Take 1 tablet (400 mg total) by mouth at bedtime. (Patient taking differently: Take 200 mg by mouth at bedtime.) 90 tablet 3   metoprolol tartrate (LOPRESSOR) 50 MG tablet Take 1 tablet (50 mg total) by mouth 2 (two) times daily. 180 tablet 3   morphine (MS CONTIN) 60 MG 12 hr tablet Take 1 tablet (60 mg total) by mouth every 12 (twelve) hours. 60 tablet 0   Multiple Vitamin (MULTIVITAMIN WITH MINERALS) TABS tablet Take 1 tablet by mouth daily. 30 tablet 0   pantoprazole (PROTONIX) 40 MG tablet Take 1 tablet (40 mg total) by mouth daily. 90 tablet 3   potassium chloride (KLOR-CON) 10 MEQ tablet Take 10 mEq by mouth 2 (two) times daily.     promethazine (PHENERGAN) 25 MG tablet Take 25 mg by mouth every 6 (six) hours as needed for nausea or vomiting.     senna-docusate (SENOKOT-S) 8.6-50 MG tablet Take 1 tablet by mouth 2 (two) times daily. (Patient taking differently: Take 1 tablet by mouth 2 (two) times daily as needed for moderate constipation.)     traZODone (DESYREL) 50 MG tablet TAKE 1-2 TABLETS BY MOUTH AT BEDTIME AS NEEDED FOR SLEEP. (Patient taking differently: Take 50 mg by mouth at bedtime as needed for sleep.) 180 tablet 1   No current  facility-administered medications on file prior to visit.    Allergies  Allergen Reactions   Zestril [Lisinopril] Anaphylaxis   Codeine Hives   Cyanocobalamin [Vitamin B12] Itching    Patient able to take B12 injections with Benadryl.  Has no issues when taking B12 tablets.    Hydrocodone Hives   Penicillins Itching and Swelling   Morphine Sulfate Swelling   Norvasc [Amlodipine] Swelling   Oxycodone Itching   Tylenol [Acetaminophen] Rash       Physical Exam Vitals requested from patient and listed below if patient had equipment and was able to obtain at home for this virtual visit: There were no vitals filed for this visit. Estimated body mass index is 47.89 kg/m as calculated from the following:   Height as of 03/18/23: 5\' 4"  (1.626 m).   Weight as of 03/18/23: 279 lb (126.6 kg).  EKG (optional): deferred due to virtual visit  GENERAL: alert, oriented, no acute distress detected, full vision exam deferred due to pandemic and/or virtual encounter  PSYCH/NEURO: pleasant and cooperative, no obvious depression or anxiety, speech and thought processing grossly intact, Cognitive function grossly intact  Flowsheet Row Video Visit [facility]/08/2023 [facility]  PHQ-9 Total Score 0           05/08/2023    4:53 PM 12/30/2022   11:39 AM 12/05/2022   11:48 AM 09/05/2022   12:09 PM 05/06/2022    2:52 PM  Depression screen PHQ 2/9  Decreased Interest 0 0 0 0 0  Down, Depressed, Hopeless 0 1 0 0 0  PHQ - 2 Score 0 1 0 0 0  Altered sleeping 0 2 0 0   Tired, decreased energy  0 1 0 0   Change in appetite 0 0 2 3   Feeling bad or failure about yourself  0 1 3 0   Trouble concentrating 0 1 0 0   Moving slowly or fidgety/restless 0 0 0 0   Suicidal thoughts 0 0 0 0   PHQ-9 Score 0 6 5 3    Difficult doing work/chores Not difficult at all  Extremely dIfficult Not difficult at all        09/28/2022   10:00 PM 09/29/2022   10:00 PM 12/05/2022   11:47 AM  12/30/2022   11:39 AM 05/08/2023    4:55 PM  Fall Risk  Falls in the past year?   Exclusion - non ambulatory 0 0  Was there an injury with Fall?    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 She also declines all vaccines. Advised to call PCP office if changes her mind and wishes to do any of theses measures at any point. Health Maintenance  Topic Date Due   MAMMOGRAM  09/16/2023 (Originally 06/23/2020)   Zoster Vaccines- Shingrix (1 of 2) 09/16/2023 (Originally [birth year]07/1984)   INFLUENZA VACCINE  05/07/2024 (Originally 04/17/2023)   COVID-19 Vaccine (4 - 2023-24 season) 05/07/2024 (Originally 05/17/2022)   Colonoscopy  09/15/2024 (Originally 04/24/2022)   PAP SMEAR-Modifier  11/28/2023   Medicare Annual Wellness (AWV)  05/07/2024   Hepatitis C Screening  Completed   HIV Screening  Completed   HPV VACCINES  Aged Out   DTaP/Tdap/Td  Discontinued   Education and counseling on the following was provided based on the above review of health and a plan/checklist for the patient, along with additional information discussed, was provided for the patient in the patient instructions :  -Advised on importance of completing advanced directives, discussed options for completing and provided information in patient instructions as well -Advised and counseled on a healthy lifestyle  -Reviewed patient's current diet. Advised and counseled on a whole foods based healthy diet. A summary of a  healthy diet was provided in the Patient Instructions.  -reviewed patient's current physical activity level and discussed exercise guidelines for adults. She currently is doing PT and encouraged her to have then provide her with exercises she can do between the visits as well   Follow up: see patient instructions     Patient Instructions  I really enjoyed getting to talk with you today! I am available on Tuesdays and Thursdays for virtual visits if you have any questions or concerns, or if I can be of any further assistance.   CHECKLIST FROM ANNUAL WELLNESS VISIT:  -Follow up (please call to schedule if not scheduled after visit):   -yearly for annual wellness visit with primary care office  Here is a list of your preventive care/health maintenance measures and the plan for each if any are due:  PLAN For any measures below that may be due:   -  please let us know if/when you wish to do the colon cancer screening and the mammogram and we would be happy to help  Health Maintenance  Topic Date Due   MAMMOGRAM  09/16/2023 (Originally 06/23/2020)   Zoster Vaccines- Shingrix (1 of 2) 09/16/2023 (Originally [birth year]1984)   INFLUENZA VACCINE  05/07/2024 (Originally 04/17/2023)   COVID-19 Vaccine (4 - 2023-24 season) 05/07/2024 (Originally 05/17/2022)   Colonoscopy  09/15/2024 (Originally 04/24/2022)   PAP SMEAR-Modifier  11/28/2023   Medicare Annual Wellness (AWV)  05/07/2024   Hepatitis C Screening  Completed   HIV Screening  Completed   HPV VACCINES  Aged Out   DTaP/Tdap/Td  Discontinued    -See a dentist at least yearly  -Get your eyes checked and then per your eye specialist's recommendations  -Other issues addressed today:   -I have included below further information regarding a healthy whole foods based diet, physical activity guidelines for adults, stress management and opportunities for social connections. I hope you find this information useful.    -----------------------------------------------------------------------------------------------------------------------------------------------------------------------------------------------------------------------------------------------------------  NUTRITION: -eat real food: lots of colorful vegetables (half the plate) and fruits -5-7 servings of vegetables and fruits per day (fresh or steamed is best), exp. 2 servings of vegetables with lunch and dinner and 2 servings of fruit per day. Berries and greens such as kale and collards are great choices.  -consume on a regular basis: whole grains (make sure first ingredient on label contains the word "whole"), fresh fruits, fish, nuts, seeds, healthy oils (such as olive oil, avocado oil, grape seed oil) -may eat small amounts of dairy and lean meat on occasion, but avoid processed meats such as ham, bacon, lunch meat, etc. -drink water -try to avoid fast food and pre-packaged foods, processed meat -most experts advise limiting sodium to < 2300mg  per day, should limit further is any chronic conditions such as high blood pressure, heart disease, diabetes, etc. The American Heart Association advised that < 1500mg  is is ideal -try to avoid foods that contain any ingredients with names you do not recognize  -try to avoid sugar/sweets (except for the natural sugar that occurs in fresh fruit) -try to avoid sweet drinks -try to avoid white rice, white bread, pasta (unless whole grain), white or yellow potatoes  EXERCISE GUIDELINES FOR ADULTS: -if you wish to increase your physical activity, do so gradually and with the approval of your doctor -STOP and seek medical care immediately if you have any chest pain, chest discomfort or trouble breathing when starting or increasing exercise  -move and stretch your body, legs, feet and arms when sitting for long periods -Physical activity guidelines for optimal health in adults: -least 150 minutes per week of  aerobic exercise (can talk, but not sing) once approved by your doctor, 20-30 minutes of sustained activity or two 10 minute episodes of sustained activity every day.  -resistance training at least 2 days per week if approved by your doctor -balance exercises 3+ days per week:   Stand somewhere where you have something sturdy to hold onto if you lose balance.    1) lift up on toes, start with 5x per day and work up to 20x   2) stand and lift on leg straight out to the side so that foot is a few inches of the floor, start with 5x each side and work up to 20x each side   3) stand on one foot, start with 5 seconds each side and work up to 20 seconds on each side  If  you need ideas or help with getting more active:  -Silver sneakers https://tools.silversneakers.com  -Walk with a Doc: http://www.duncan-williams.com/  -try to include resistance (weight lifting/strength building) and balance exercises twice per week: or the following link for ideas: http://castillo-powell.com/  BuyDucts.dk  STRESS MANAGEMENT: -can try meditating, or just sitting quietly with deep breathing while intentionally relaxing all parts of your body for 5 minutes daily -if you need further help with stress, anxiety or depression please follow up with your primary doctor or contact the wonderful folks at [facility]: [phone]  SOCIAL CONNECTIONS: -options in [place] if you wish to engage in more social and exercise related activities:  -Silver sneakers https://tools.silversneakers.com  -Walk with a Doc: http://www.duncan-williams.com/  -Check out the [facility] Active Adults 50+ section on the [place] of [employer] (hiking clubs, book clubs, cards and games, chess, exercise classes, aquatic classes and much more) - see the website for  details: https://www.[place].gov/departments/parks-recreation/active-adults50  -YouTube has lots of exercise videos for different ages and abilities as well  -[facility] (a variety of indoor and outdoor inperson activities for adults). 4301415491. [address]  -Virtual Online Classes (a variety of topics): see seniorplanet.org or call [phone]  -consider volunteering at a school, hospice center, church, senior center or elsewhere           Terressa Koyanagi, DO

## 2023-05-08 NOTE — Therapy (Signed)
OUTPATIENT OCCUPATIONAL THERAPY NEURO TREATMENT   Patient Name: Dana [name] MRN: [id number] DOB:04/11/1966, 57 y.o., female Today's Date: 05/08/2023  PCP: Dr. Clent Ridges REFERRING PROVIDER: Dr. Gershon Crane  END OF SESSION:  OT End of Session - 05/08/23 1040     Visit Number 22    Number of Visits 35    Date for OT Re-Evaluation 05/13/23    Authorization Type UHC, Medicare    Progress Note Due on Visit 30    OT Start Time 1017    OT Stop Time 1100    OT Time Calculation (min) 43 min    Activity Tolerance Patient tolerated treatment well    Behavior During Therapy [facility] for tasks assessed/performed                     Past Medical History:  Diagnosis Date   Anxiety    Back pain with radiation    Breast mass, right    Chronic pain    Chronic, continuous use of opioids    Complication of anesthesia 04/07/14   Allergic reaction to Lisinopril immediately following surgery   DJD (degenerative joint disease)    Fibromyalgia    Groin abscess    Headache(784.0)    History of IBS    Hypercholesterolemia    IBS (irritable bowel syndrome)    Lactose intolerance    Mild hypertension    Obesity    Tobacco use disorder    Umbilical hernia    Symptomatic   Past Surgical History:  Procedure Laterality Date   ABDOMINAL HYSTERECTOMY     ANTERIOR CERVICAL DECOMP/DISCECTOMY FUSION N/A 12/20/2015   Procedure: ANTERIOR CERVICAL DECOMPRESSION FUSION CERVICAL 4-5, CERVICAL 5-6, CERVICAL 6-7 WITH INSTRUMENTATION AND ALLOGRAFT;  Surgeon: Estill Bamberg, MD;  Location: [facility] OR;  Service: Orthopedics;  Laterality: N/A;  Anterior cervical decompression fusion, cervical 4-5, cervical 5-6, cervical 6-7 with instrumentation and allograft   BACK SURGERY     BILATERAL SALPINGECTOMY  09/03/2012   Procedure: BILATERAL SALPINGECTOMY;  Surgeon: Ok Edwards, MD;  Location: [facility] ORS;  Service: Gynecology;  Laterality: Bilateral;   BREAST BIOPSY Right 04/07/2014   Procedure: REMOVAL RIGHT BREAST  MASS WITH WIRE LOCALIZATION;  Surgeon: Adolph Pollack, MD;  Location: [facility] OR;  Service: General;  Laterality: Right;   BREAST EXCISIONAL BIOPSY Left    x2   BREAST LUMPECTOMY     x2   CARPAL TUNNEL RELEASE Left 05/11/2019   Procedure: LEFT CARPAL TUNNEL RELEASE, RIGHT TENNIS ELBOW MARCAINE/DEPO MEDROL INJECTION UNDER ANESTHESIA;  Surgeon: Kerrin Champagne, MD;  Location: [facility] OR;  Service: Orthopedics;  Laterality: Left;   COLONOSCOPY W/ BIOPSIES  04/24/2012   per Dr. Leone Payor, clear, repeat in 10 yrs    disectomy     ESOPHAGOGASTRODUODENOSCOPY     FINGER SURGERY     Right index-excision of mass    FOOT SURGERY Right    Bone Spurs   IR FLUORO GUIDE CV LINE RIGHT  01/29/2023   IR REMOVAL TUN CV CATH W/O FL  02/08/2023   IR US GUIDE VASC ACCESS RIGHT  01/31/2023   LAPAROSCOPIC HYSTERECTOMY  09/03/2012   Procedure: HYSTERECTOMY TOTAL LAPAROSCOPIC;  Surgeon: Ok Edwards, MD;  Location: [facility] ORS;  Service: Gynecology;  Laterality: N/A;   LUMBAR DISC SURGERY     TUBAL LIGATION     UMBILICAL HERNIA REPAIR N/A 05/01/2018   Procedure: UMBILICAL HERNIA REPAIR;  Surgeon: Jimmye Norman, MD;  Location: [facility] OR;  Service: General;  Laterality: N/A;   Patient Active Problem List   Diagnosis Date Noted   CIDP (chronic inflammatory demyelinating polyneuropathy) (HCC) 01/28/2023   Gait abnormality 01/28/2023   Glaucoma 01/28/2023   Pain due to onychomycosis of toenails of both feet 01/21/2023   Wheelchair dependence 12/30/2022   Nerve pain 12/30/2022   Insomnia due to medical condition 12/30/2022   Acute inflammatory demyelinating polyneuropathy (HCC) 10/01/2022   UTI (urinary tract infection) 09/24/2022   Hypomagnesemia 09/24/2022   Weakness 09/23/2022   Hypokalemia 09/23/2022   B12 deficiency 09/05/2022   Folate deficiency 09/05/2022   Carpal tunnel syndrome, left upper limb 05/11/2019    Class: Chronic   Spinal stenosis of lumbar region with neurogenic claudication 08/20/2018   Congenital  deformity of finger 09/12/2017   Primary osteoarthritis of right knee 02/27/2017   Right tennis elbow 11/11/2016   Muscle cramps 01/15/2016   Bilateral leg edema 01/08/2016   Radiculopathy 12/20/2015   Hyperglycemia 12/21/2014   Mastodynia, female 11/30/2014   S/P excision of fibroadenoma of breast 11/30/2014   Angioedema of lips 04/07/2014   Fibroadenoma of right breast 03/07/2014   Pelvic pain in female 06/19/2012   Menorrhagia 06/11/2012   SUI (stress urinary incontinence, female) 06/11/2012   HTN (hypertension) 06/11/2012   IBS (irritable bowel syndrome) - diarrhea predominant 03/17/2012   MICROSCOPIC HEMATURIA 11/30/2010   BREAST PAIN, RIGHT 11/30/2010   BREAST MASS, RIGHT 01/12/2010   HYPERCHOLESTEROLEMIA 12/07/2007   CIGARETTE SMOKER 12/07/2007   DEGENERATIVE JOINT DISEASE 12/07/2007   Low back pain with sciatica 12/07/2007   HEADACHE 12/07/2007   Obesity, Class III, BMI 40-49.9 (morbid obesity) (HCC) 08/03/2007   Anxiety state 08/03/2007    ONSET DATE: 12/05/22- referral date  REFERRING DIAG: G61.0 (ICD-10-CM) - Guillain Barr syndrome (HCC)  THERAPY DIAG:    Rationale for Evaluation and Treatment: Rehabilitation  SUBJECTIVE:   SUBJECTIVE STATEMENT: Pt reports no pain PERTINENT HISTORY: Pt is a 57 y/o female hospitalized 09/23/22  with progressive weakness and sensation changes, consistent with Guillain-Barr syndrome. MRI negative. LP results and assessment most consistent with Guillain-Barr syndrome.  Pt was d/c home from CIR 11/06/22 PMH includes: glaucoma anxiety, DJD, fibromyalgia, HTN, tobacco use, B-12 deficiency, OA, ACDF 2017.  Pt was re-hospitalized 01/28/2456 with CIDP (chronic inflammatory demyelinating polyneuropathy). Saw Neuro on day of admission, NCS showed demyelination, referred to hospital for PLEX. Initially diagnosed with an acute demyelinating neuropathy Jan 2024, treated with IVIG. PMH: Spinal stenosis, HTN, Obesity   PRECAUTIONS: Fall  WEIGHT  BEARING RESTRICTIONS: no  PAIN: no    FALLS: Has patient fallen in last 6 months? No  LIVING ENVIRONMENT: Lives with: lives with their spouse Lives in: House/apartment Stairs: has ramp built Has following equipment at home:   BSC, steadi,  tub bench but can't access bathroom   PLOF: Independent  PATIENT GOALS: increase independence   OBJECTIVE:   HAND DOMINANCE: Right  ADLs: from initial eval Overall ADLs: assistance required, unable to access BR Transfers/ambulation related to ADLs: Eating: unable to cut food Grooming: set up UB Dressing: set up for bra and shirt LB Dressing: max A Toileting: uses BSC uses steadi Bathing: min-mod A in hospital  bed  Tub Shower transfers: n/a Equipment: Emergency planning/management officer  IADLs:dependent for IADLS   MOBILITY STATUS:  uses w/c  , per PT report minguard for transfer from w/c to mat    ACTIVITY TOLERANCE: Activity tolerance: Pt fatigues quickly    UPPER EXTREMITY ROM:  see updates for 02/18/23 re-eval  Active ROM Right 02/18/23 Left 02/18/23  Shoulder flexion 130 120  Shoulder abduction 90 90  Shoulder adduction    Shoulder extension    Shoulder internal rotation    Shoulder external rotation    Elbow flexion    Elbow extension  -5  Wrist flexion    Wrist extension    Wrist ulnar deviation    Wrist radial deviation    Wrist pronation    Wrist supination    (Blank rows = not tested)  UPPER EXTREMITY MMT:   see below for re-eval 02/18/23  MMT Right 02/18/23 Left 02/18/23  Shoulder flexion 3+/5 3+/5  Shoulder abduction    Shoulder adduction    Shoulder extension    Shoulder internal rotation    Shoulder external rotation    Middle trapezius    Lower trapezius    Elbow flexion 4/5 4/5  Elbow extension 4-/5 4-/5  Wrist flexion    Wrist extension    Wrist ulnar deviation    Wrist radial deviation    Wrist pronation    Wrist supination      HAND FUNCTION: Grip strength: Right: 32 lbs; Left: 18 lbs, 02/18/23 R 55  lbs, L 35 lbs  COORDINATION: 9 Hole Peg test:initial eval  Right: 1 min 48 sec; Left: 4 pegs in 2 mins  Box and Blocks:  Right 35 blocks, Left  26 blocks 02/18/23- RUE 35.46, LUE 65 secs  SENSATION: Light touch: WFL Hot/Cold: Not tested    COGNITION: Overall cognitive status:NT   VISION: Subjective report: reports hx of glaucoma VISION ASSESSMENT: Not tested  OBSERVATIONS: Pt is eager to improve, pt's husband is very supportive   TODAY'S TREATMENT:                                                                                                                              DATE:05/08/23- Pt ambulated grossly 3 ft with cane with minguard to and from to arm bike. Arm bike x 6 mins level 1 for conditioning Pt copied small peg design with left and right UE's for increased fine motor coordination, mod difficulty for LUE and drops, occasional min difficulty with RUE. Pt's LUE continues to fatigue quickly. Standing to perform functional reaching to retrieve items from chair to place on elevated surface with close supervision for standing balance and tolerance. Rest break required then pt was able to return items to lower surface with RUE with supervision. Pt continues to fatigue quickly and she stood for less than a minute.    05/01/23- Arm bike x 6 mins level 2 for conditioning, pt walked to and from arm bike with walker and supervision. Placing and removing grooved pegs from pegboard with LUE for increased fine motor coordination, removing with in hand manipulation, min difficulty/ drops and v.c Placing graded clothespins 1-8# on vertical antennae with LUE then removing with RUE for sustained pinch, min v.c  Gripper set at level 2, pt picked up blocks  with LUE for sustained grip, min difficulty/ drops   04/22/23:  This progress update covers dates of service from 6/4-04/22/23.   Started session working on LUE coordination.  Copying small peg patterns.  Patient with frequent drops, difficulty  orienting small pegs in hand.  Patient with report of diminished sensation in left hand.  Attempted same task with left hand in glove with slightly increased ability initially - however - not able to sustain.   Worked on hand strengthening with gripper level 2 x 20 blocks.  Faciliatation an dcueing to reduce trunk lateral flexion and shoulder elevation.  Attempted 5 blocks at level 3 - fatigued after 3 blocks.   Standing balance/ tolerance at counter - working toward reducing UE reliance - reaching into chest high shelf, reaching to shelf at eye level.  Stand with walker and taking slow cautious steps to counter.    04/08/23-Pt ambulated approximately 5 feet to walk to arm bike with rolling walker with close supervision. Arm bike x 6 mins level 1 for conditioning Min a for sit to stand from armbike then pt ambulated back to w/c. Seated in w/c closed chain shoulder flexion, with unweighted ball followed by shoulder flexion then trunk rotation and chest press wih 2.2 lbs weighted ball for UE strength and endurance, rest between sets. Fine motor coordination task to copy small peg design with LUE, min-mod difficulty, v.c to avoid shoulder hike/ compensation.  04/01/23- Seated closed chain shoulder flexion, with unweighted ball followed by shoulder flexion then trunk rotation and chest press wih 2.2 lbs weighted ball. Rest in between sets seated on mat. Pt walker to mat with walker and supervision. Standing at countertop functional reaching to place graded clothespins in targets with RUE, rest break then pt removed with left hand, close supervision. Discussed plans to continue OT after next week. Pt walked mat to w/c with walker and close supervision .   03/27/23-Theraband exercises for biceps curls, triceps, rowing  and shoulder extension with green band and shoulder abduction with red band 10-15 reps each, min v.c, rest break in between sets Standing at countertop to place and remove items from lowest  overhead shelf, seated rest break prior to replacing, close supervision for moblity with walker  Gripper set at level 2 sustained grip to pick up 1 inch blocks, 1/2 with RUE with min difficulty and 1/2 with left with min-mod difficulty for sustained grip Graded clothespins for sustained pinch 1-8# using left and right UE's for sustained pinch, RUE only with black clothespins dur to difficulty. Therapist recommends that pt orders a long handled sponge to apply lotion onto legs. Pt with continued bilateral LE swelling.  03/25/23 Pt is hoarse today and reports a tickling in her throat as well as LE swelling. Therapist recommends pt calls MD.Pt arrived late as her transportation did not pick her up. Arm bike x 6 mins level 1 for conditioning.Pt transferred to and from arm bike to w/c with min A. Copying small peg design for increased fine motor coordination with LUE, min difficulty and increased time, removing with in hand manipulation. Pt demonstrates improved fine motor coordination.  03/18/23- Pt amb across gym with RW and minguard to arm bike.  Arm bike x 7 mins level 1  for conditioning Placing small pegs into pegboard with LUE for increased fine motor coordination, pt with improved performance and coordination today. Pt walked 3 ft from chair to counter and stood to place and remove items from overhead shelves with 1 seated rest  break. Discussion with patient regarding her home situation,. Pt reports frustration and she feels like she is a burden on husband. Therapist recommends pt seeks counseling. Shoulder abduction  with red band biceps curls with green band  20 reps for UE strengthening   03/13/23-Pt reports she had a stomach bug and she wasn't feeling well. Placing large pegs into pegboard with LUE with occasional min difficulty, then removing with 3 pt pinch min v.c to avoid shoulder hike. Red theraband exercises for shoulder abduction, rowing and extension 15 reps each,  green band for biceps  curls and triceps extension 15 reps each, min v.c Arm bike x 6 mins level 3 for conditioning, pt transfered to and from with min A Discussed safety for cooking and recommendation that if pt decides to fry fish, she performs with someone for safety, and she keeps her LUE away from hot surfaces due to sensory deficits. Pt verbalized understanding.   03/04/23 Red theraband exercises for shoulder abduction, rowing and extension 20 reps each, upgraded to green band for biceps curls and triceps extension 20 reps each, min v.c Arm bike x 8 mins, 4 mins each direction, level 2 for conditioning, pt transferred to and from seat with supervision. Coordination activities with LUE: flipping and dealing cards, min difficulty/ v.c  02/27/23-Arm bike x 6 mins level 2 for conditioning (Pt transfers to and from w/c with supervision) Placing large pegs into pegboard with LUE then removing for increased fine motor coordination, min difficulty/ v.c  Standing at countertop with supervision to place and remove items with right then left UE's, pt stood x 3 for 30 secs to 1 min 15 secs, rest breaks in between Gripper set at level 1 to pick up 1 inch blocks  with right and left UE's for sustained grip, min difficulty Pt reports amb to BR with walker without assist, therapist recommends supervision for safety.  02/25/23-Arm bike x 6 mins level 2 for conditioning, Pt transferred to the seat for the first time with supervision Reviewed previously issued red theraband exercises for UE strength 20 reps each, min v.c  Fine motor coordination exercises with left and right UE's, flipping playing cards and stacking coins with min difficulty using RUE, picking up coins and placing in container with LUE and flipping cards mod difficulty. Pt attempted placing small pegs into a pegboard with bilateral UE's max difficulty with LUE, mod difficulty with RUE, task was discontinued. Pt reports being a little off. She took her meds without  eating and got into a disagreement with transportation this a.m.    02/18/23- re-eval performed and therapist discussed potential goals with patient.  PATIENT EDUCATION: Education details:   see above Person educated: Patient Education method: explanation, demonstration Education comprehension: verbalized understanding, returned demonstration.  HOME EXERCISE PROGRAM:  12/17/22- coordination HEP, cane exercises seated   GOALS: Goals reviewed with patient? Yes  SHORT TERM GOALS: Target date: 03/20/23  I with initial HEP Baseline: Goal status: met 01/21/23  2.  Pt will demonstrate improved UE functional use as evidenced increasing bilateral box/ blocks score by 4 blocks.  Baseline: R 35, L 26 Goal status  met 12/31/22 RUE :43:  LUE 35    3.  Pt will donn pants with mod A. Baseline: max A Goal status:met  02/18/23- pt performed mod I today  4. Pt will verbalize understanding of adapted strategies to maximize safety and I with ADLs/ IADLs .  Goal status:  ongoing, has tub bench and long handled sponge  8/6  ongoing - patient relies on husband to lift legs into tub - encouraging patient to increase participation with IADL at home.    5.  Pt will demonstrate ability to stand for functional activity with min A x 10 mins in prep for ADLs.  Goal status: not consistent, pt stood for 1 min on 02/18/23 8/6:  Stood x 2 min in clinic - patient reporting increased fatigue due to IVIG treatment.    6.  Pt will perform w/c to [facility] transfers mod I-  mod I per pt report 05/08/23  Goal status:  met, 02/18/23 7. I with updated HEP  Goal status- ongoing 03/18/23  8/6 - goal met - yellow theraband.     LONG TERM GOALS: Target date: 05/13/23 I with updated HEP  Goal status: ongoing, 05/08/23  2.  Pt will perform LB dressing mod I. Baseline:max A Goal status:met 03/25/23- 8/6- patient indicates she is dressing herself.   3.  Pt will bathe with supervision/ set up. Baseline: min-mod A for sponge  bath Goal status: met for sponge bathing at set up,  for shower - partially met pt has assist with lifting legs into tub, then she completes bathing at a supervision level, has assist with drying  4.  Pt will perform basic cooking at a w/c level mod I  Revised goal: Pt will perform basic cooking modified independently at a walker level.  Goal status:  met, 02/18/23  pt has performed simple tasks mod I w/c level, goal revised - Ongoing, pt is currently performing at a w/c level-03/25/23 8/6 ongoing - not walking in kitchen  5.  Pt will demonstrate improved fine motor coordination as evidenced by performing LUE 9 hole peg test in 2 mins or less.  Revised goal:Pt will demonstrate improved fine motor coordination as evidenced by performing 9 hole peg test in 55 secs or less 8/6- 63 seconds- ongoing  Goal status: initial goal met 6/4/246/4/24-  LUE 65 secs , goal revised  6.  Pt will demonstrate improved fine motor coordination as evidenced by performing RUE 9 hole peg test in 1 mins  30 secs or less.  Baseline: RUE  1 min 48 secs, LUE 4 pegs placed in 2 mins  Goal status: met 6/4/246/4/24- RUE 35.46,     7. Pt will perform shower with supervision using tub transfer bench    Goal status: ongoing min A 03/25/23  8/6-ongoing min   8. Pt will perform basic home management at a walker level mod I    Goal status: ongoing, 03/25/23  ongoing 8/6- ongoing ASSESSMENT:  CLINICAL IMPRESSION: Pt is progressing towards goals. She reports she was able to walk a short distance at home without her walker. Pt reports she is bathing following set up with only assistance for transferring LE's and drying off. PERFORMANCE DEFICITS: in functional skills including ADLs, IADLs, coordination, dexterity, sensation, ROM, strength, flexibility, Fine motor control, Gross motor control, mobility, balance, endurance, decreased knowledge of precautions, decreased knowledge of use of DME, and UE functional use, cognitive skills  including  and psychosocial skills including coping strategies, environmental adaptation, habits, interpersonal interactions, and routines and behaviors.   IMPAIRMENTS: are limiting patient from ADLs, IADLs, rest and sleep, play, leisure, and social participation.   CO-MORBIDITIES: may have co-morbidities  that affects occupational performance. Patient will benefit from skilled OT to address above impairments and improve overall function.  MODIFICATION OR ASSISTANCE TO COMPLETE EVALUATION: No modification of tasks or assist necessary to complete  an evaluation.  OT OCCUPATIONAL PROFILE AND HISTORY: Detailed assessment: Review of records and additional review of physical, cognitive, psychosocial history related to current functional performance.  CLINICAL DECISION MAKING: LOW - limited treatment options, no task modification necessary  REHAB POTENTIAL: Good  EVALUATION COMPLEXITY: Low    PLAN:  OT FREQUENCY: 2x/week  OT DURATION: 12 weeks  PLANNED INTERVENTIONS: self care/ADL training, therapeutic exercise, therapeutic activity, neuromuscular re-education, manual therapy, passive range of motion, gait training, balance training, stair training, functional mobility training, aquatic therapy, ultrasound, paraffin, moist heat, cryotherapy, patient/family education, energy conservation, coping strategies training, DME and/or AE instructions, and Re-evaluation  RECOMMENDED OTHER SERVICES: PT  CONSULTED AND AGREED WITH PLAN OF CARE: Patient and family member/caregiver  PLAN FOR NEXT SESSION: check goals, plan to renew next week  Nadea Kirkland, OT 05/08/2023, 10:48 AM

## 2023-05-08 NOTE — Patient Instructions (Addendum)
I really enjoyed getting to talk with you today! I am available on Tuesdays and Thursdays for virtual visits if you have any questions or concerns, or if I can be of any further assistance.   CHECKLIST FROM ANNUAL WELLNESS VISIT:  -Follow up (please call to schedule if not scheduled after visit):   -yearly for annual wellness visit with primary care office  Here is a list of your preventive care/health maintenance measures and the plan for each if any are due:  PLAN For any measures below that may be due:   -please let us know if/when you wish to do the colon cancer screening and the mammogram and we would be happy to help  Health Maintenance  Topic Date Due   MAMMOGRAM  09/16/2023 (Originally 06/23/2020)   Zoster Vaccines- Shingrix (1 of 2) 09/16/2023 (Originally 09/07/1984)   INFLUENZA VACCINE  05/07/2024 (Originally 04/17/2023)   COVID-19 Vaccine (4 - 2023-24 season) 05/07/2024 (Originally 05/17/2022)   Colonoscopy  09/15/2024 (Originally 04/24/2022)   PAP SMEAR-Modifier  11/28/2023   Medicare Annual Wellness (AWV)  05/07/2024   Hepatitis C Screening  Completed   HIV Screening  Completed   HPV VACCINES  Aged Out   DTaP/Tdap/Td  Discontinued    -See a dentist at least yearly  -Get your eyes checked and then per your eye specialist's recommendations  -Other issues addressed today:   -I have included below further information regarding a healthy whole foods based diet, physical activity guidelines for adults, stress management and opportunities for social connections. I hope you find this information useful.   -----------------------------------------------------------------------------------------------------------------------------------------------------------------------------------------------------------------------------------------------------------  NUTRITION: -eat real food: lots of colorful vegetables (half the plate) and fruits -5-7 servings of vegetables and fruits per  day (fresh or steamed is best), exp. 2 servings of vegetables with lunch and dinner and 2 servings of fruit per day. Berries and greens such as kale and collards are great choices.  -consume on a regular basis: whole grains (make sure first ingredient on label contains the word "whole"), fresh fruits, fish, nuts, seeds, healthy oils (such as olive oil, avocado oil, grape seed oil) -may eat small amounts of dairy and lean meat on occasion, but avoid processed meats such as ham, bacon, lunch meat, etc. -drink water -try to avoid fast food and pre-packaged foods, processed meat -most experts advise limiting sodium to < 2300mg  per day, should limit further is any chronic conditions such as high blood pressure, heart disease, diabetes, etc. The American Heart Association advised that < 1500mg  is is ideal -try to avoid foods that contain any ingredients with names you do not recognize  -try to avoid sugar/sweets (except for the natural sugar that occurs in fresh fruit) -try to avoid sweet drinks -try to avoid white rice, white bread, pasta (unless whole grain), white or yellow potatoes  EXERCISE GUIDELINES FOR ADULTS: -if you wish to increase your physical activity, do so gradually and with the approval of your doctor -STOP and seek medical care immediately if you have any chest pain, chest discomfort or trouble breathing when starting or increasing exercise  -move and stretch your body, legs, feet and arms when sitting for long periods -Physical activity guidelines for optimal health in adults: -least 150 minutes per week of aerobic exercise (can talk, but not sing) once approved by your doctor, 20-30 minutes of sustained activity or two 10 minute episodes of sustained activity every day.  -resistance training at least 2 days per week if approved by your doctor -balance exercises 3+  days per week:   Stand somewhere where you have something sturdy to hold onto if you lose balance.    1) lift up on  toes, start with 5x per day and work up to 20x   2) stand and lift on leg straight out to the side so that foot is a few inches of the floor, start with 5x each side and work up to 20x each side   3) stand on one foot, start with 5 seconds each side and work up to 20 seconds on each side  If you need ideas or help with getting more active:  -Silver sneakers https://tools.silversneakers.com  -Walk with a Doc: http://www.duncan-williams.com/  -try to include resistance (weight lifting/strength building) and balance exercises twice per week: or the following link for ideas: http://castillo-powell.com/  BuyDucts.dk  STRESS MANAGEMENT: -can try meditating, or just sitting quietly with deep breathing while intentionally relaxing all parts of your body for 5 minutes daily -if you need further help with stress, anxiety or depression please follow up with your primary doctor or contact the wonderful folks at [facility]: [phone]  SOCIAL CONNECTIONS: -options in [place] if you wish to engage in more social and exercise related activities:  -Silver sneakers https://tools.silversneakers.com  -Walk with a Doc: http://www.duncan-williams.com/  -Check out the [facility] Active Adults 50+ section on the [place] [employer] (hiking clubs, book clubs, cards and games, chess, exercise classes, aquatic classes and much more) - see the website for details: https://www.[place].gov/departments/parks-recreation/active-adults50  -YouTube has lots of exercise videos for different ages and abilities as well  -[facility] (a variety of indoor and outdoor inperson activities for adults). [phone]. [address]  -Virtual Online Classes (a variety of topics): see seniorplanet.org or call (539)847-7161  -consider volunteering at a school, hospice center, church, senior  center or elsewhere

## 2023-05-08 NOTE — Therapy (Signed)
OUTPATIENT PHYSICAL THERAPY LOWER EXTREMITY TREATMENT  Progress Note Reporting Period 02/24/23 to 05/01/23  See note below for Objective Data and Assessment of Progress/Goals.      Patient Name: Dana [name] MRN: [id number] DOB:Feb 04, 1966, 57 y.o., female Today's Date: 05/08/2023  END OF SESSION:  PT End of Session - 05/08/23 0952     Visit Number 21    Date for PT Re-Evaluation 05/23/23    PT Start Time 0948    PT Stop Time 1012    PT Time Calculation (min) 24 min    Activity Tolerance Patient tolerated treatment well    Behavior During Therapy [facility] for tasks assessed/performed               Past Medical History:  Diagnosis Date   Anxiety    Back pain with radiation    Breast mass, right    Chronic pain    Chronic, continuous use of opioids    Complication of anesthesia 04/07/14   Allergic reaction to Lisinopril immediately following surgery   DJD (degenerative joint disease)    Fibromyalgia    Groin abscess    Headache(784.0)    History of IBS    Hypercholesterolemia    IBS (irritable bowel syndrome)    Lactose intolerance    Mild hypertension    Obesity    Tobacco use disorder    Umbilical hernia    Symptomatic   Past Surgical History:  Procedure Laterality Date   ABDOMINAL HYSTERECTOMY     ANTERIOR CERVICAL DECOMP/DISCECTOMY FUSION N/A 12/20/2015   Procedure: ANTERIOR CERVICAL DECOMPRESSION FUSION CERVICAL 4-5, CERVICAL 5-6, CERVICAL 6-7 WITH INSTRUMENTATION AND ALLOGRAFT;  Surgeon: Estill Bamberg, MD;  Location: [facility] OR;  Service: Orthopedics;  Laterality: N/A;  Anterior cervical decompression fusion, cervical 4-5, cervical 5-6, cervical 6-7 with instrumentation and allograft   BACK SURGERY     BILATERAL SALPINGECTOMY  09/03/2012   Procedure: BILATERAL SALPINGECTOMY;  Surgeon: Ok Edwards, MD;  Location: [facility] ORS;  Service: Gynecology;  Laterality: Bilateral;   BREAST BIOPSY Right 04/07/2014   Procedure: REMOVAL RIGHT BREAST MASS WITH WIRE  LOCALIZATION;  Surgeon: Adolph Pollack, MD;  Location: [facility] OR;  Service: General;  Laterality: Right;   BREAST EXCISIONAL BIOPSY Left    x2   BREAST LUMPECTOMY     x2   CARPAL TUNNEL RELEASE Left 05/11/2019   Procedure: LEFT CARPAL TUNNEL RELEASE, RIGHT TENNIS ELBOW MARCAINE/DEPO MEDROL INJECTION UNDER ANESTHESIA;  Surgeon: Kerrin Champagne, MD;  Location: [facility] OR;  Service: Orthopedics;  Laterality: Left;   COLONOSCOPY W/ BIOPSIES  04/24/2012   per Dr. Leone Payor, clear, repeat in 10 yrs    disectomy     ESOPHAGOGASTRODUODENOSCOPY     FINGER SURGERY     Right index-excision of mass    FOOT SURGERY Right    Bone Spurs   IR FLUORO GUIDE CV LINE RIGHT  01/29/2023   IR REMOVAL TUN CV CATH W/O FL  02/08/2023   IR US GUIDE VASC ACCESS RIGHT  01/31/2023   LAPAROSCOPIC HYSTERECTOMY  09/03/2012   Procedure: HYSTERECTOMY TOTAL LAPAROSCOPIC;  Surgeon: Ok Edwards, MD;  Location: [facility] ORS;  Service: Gynecology;  Laterality: N/A;   LUMBAR DISC SURGERY     TUBAL LIGATION     UMBILICAL HERNIA REPAIR N/A 05/01/2018   Procedure: UMBILICAL HERNIA REPAIR;  Surgeon: Jimmye Norman, MD;  Location: [facility] OR;  Service: General;  Laterality: N/A;   Patient Active Problem List   Diagnosis Date Noted  CIDP (chronic inflammatory demyelinating polyneuropathy) (HCC) 01/28/2023   Gait abnormality 01/28/2023   Glaucoma 01/28/2023   Pain due to onychomycosis of toenails of both feet 01/21/2023   Wheelchair dependence 12/30/2022   Nerve pain 12/30/2022   Insomnia due to medical condition 12/30/2022   Acute inflammatory demyelinating polyneuropathy (HCC) 10/01/2022   UTI (urinary tract infection) 09/24/2022   Hypomagnesemia 09/24/2022   Weakness 09/23/2022   Hypokalemia 09/23/2022   B12 deficiency 09/05/2022   Folate deficiency 09/05/2022   Carpal tunnel syndrome, left upper limb 05/11/2019    Class: Chronic   Spinal stenosis of lumbar region with neurogenic claudication 08/20/2018   Congenital deformity of finger  09/12/2017   Primary osteoarthritis of right knee 02/27/2017   Right tennis elbow 11/11/2016   Muscle cramps 01/15/2016   Bilateral leg edema 01/08/2016   Radiculopathy 12/20/2015   Hyperglycemia 12/21/2014   Mastodynia, female 11/30/2014   S/P excision of fibroadenoma of breast 11/30/2014   Angioedema of lips 04/07/2014   Fibroadenoma of right breast 03/07/2014   Pelvic pain in female 06/19/2012   Menorrhagia 06/11/2012   SUI (stress urinary incontinence, female) 06/11/2012   HTN (hypertension) 06/11/2012   IBS (irritable bowel syndrome) - diarrhea predominant 03/17/2012   MICROSCOPIC HEMATURIA 11/30/2010   BREAST PAIN, RIGHT 11/30/2010   BREAST MASS, RIGHT 01/12/2010   HYPERCHOLESTEROLEMIA 12/07/2007   CIGARETTE SMOKER 12/07/2007   DEGENERATIVE JOINT DISEASE 12/07/2007   Low back pain with sciatica 12/07/2007   HEADACHE 12/07/2007   Obesity, Class III, BMI 40-49.9 (morbid obesity) (HCC) 08/03/2007   Anxiety state 08/03/2007    PCP: Nelwyn Salisbury, MD  REFERRING PROVIDER: Nelwyn Salisbury, MD  REFERRING DIAG: G61.0 (ICD-10-CM) - Guillain Barr syndrome [facility])   THERAPY DIAG:  Muscle weakness (generalized)  Other lack of coordination  Unsteadiness on feet  Other abnormalities of gait and mobility  Difficulty in walking, not elsewhere classified  CIDP (chronic inflammatory demyelinating polyneuropathy) (HCC)  Rationale for Evaluation and Treatment: Rehabilitation  ONSET DATE: 12/05/22  SUBJECTIVE:   SUBJECTIVE STATEMENT: Patient reports that she walked this weekend without an AD. She was very excited. Arrived late due to transportation.  OBJECTIVE STATEMENT: 01/28/23 ASSESSMENT AND PLAN   Dana [name] is a 57 y.o. female   Demyelinating polyradiculoneuropathy             Symptom onset monophasic since September 2024, failed to respond to IVIG in January 2024,             EMG nerve conduction study showed demyelinating features, no evidence of axonal loss,              Talk with neurohospitalist Dr. Iver Nestle, ED triage, will send her for hospital admission for plasma exchange, please add on following labs, immunofixative protein electrophoresis, ANA with reflex, SSA, SSB titer, iron panel             Starting outpatient IVIG prior authorization process,  KAMYRN SNELSON is a 57 y.o. female who [facility] the [facility] ED on 09/23/2022 with bilateral lower extremity weakness with decreased mobility that has progressed to both arms. She also reported numbness and tingling of the extremities. She was transferred to [facility] for [facility]. 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  Inpatient Rehab F/B HHPT.  R knee injection 10/08/22 trigger point injections for worsening back pain 2/15  RA  PAIN:  Are you having pain? Yes: NPRS scale: up to 10/10 Pain location: all over, but her R knee  pain is worst,  Pain description: Sharp Aggravating factors: Has difficulty standing up Relieving factors: Medicine helps somewhat  PRECAUTIONS: Fall  WEIGHT BEARING RESTRICTIONS: No  FALLS:  Has patient fallen in last 6 months? No  LIVING ENVIRONMENT: Lives with: lives with their family Lives in: House/apartment Stairs: [place] a ramp Has following equipment at home: Environmental consultant - 2 wheeled, Wheelchair (power), Tour manager, bed side commode, Ramped entry, and [employer] lift  OCCUPATION: on disability due to RA and a fall several years ago  PLOF: Independent  PATIENT GOALS: walk  NEXT MD VISIT: She is scheduled to follow up with Dr. Serita Sheller with Physical Med/Rehab on 12-30-22. She is scheduled to follow up with Dr. Levert Feinstein at Neurology on 01-28-23.   OBJECTIVE:   DIAGNOSTIC FINDINGS:  Right knee radiographs 11/09/2020   FINDINGS: Severe patellofemoral joint space narrowing. Severe superior and mild inferior patellar degenerative osteophytosis. Moderate superior trochlear degenerative osteophytosis. No joint effusion. Severe medial compartment  joint space narrowing with moderate peripheral medial and lateral compartment degenerative osteophytes.  COGNITION: Overall cognitive status: Within functional limits for tasks assessed     SENSATION: Light touch: Impaired  and B hands and feet and lower legs  EDEMA:  BLE edema noted.  MUSCLE LENGTH: Hamstrings: WFL Thomas test: B tightness, to neutral  POSTURE: rounded shoulders and flexed trunk   PALPATION: No TTP  LOWER EXTREMITY ROM: BLE ROM limited in all planes an all joints due to body habitus   LOWER EXTREMITY MMT:  MMT Right eval Left eval R 05/01/23 L 05/01/23  Hip flexion 3 3- 4- 3+  Hip extension 3- 3- 3 3  Hip abduction 3 3- 3+ 3+  Hip adduction      Hip internal rotation      Hip external rotation      Knee flexion 4 4- 4 4  Knee extension 4- 4- 4 4  Ankle dorsiflexion 4- 4- 4 4  Ankle plantarflexion      Ankle inversion 4- 4-    Ankle eversion 4- 4-     (Blank rows = not tested)  FUNCTIONAL TESTS:  5 times sit to stand: unable to complete Timed up and go (TUG): unable to complete,  04/22/23  TUG 63 seconds with FWW  Bed Mobility: Occasional min A for sup <> sit, rolled B on mat with great effort, light min A  TRANSFERS: Scooting transfer with CGA, stand pivot with CGA, she reports that she sometimes has difficulty rising due to her knee pain  GAIT: Distance walked: 0' Assistive device utilized:  shopping cart-will bring her RW from home. Level of assistance: Min A Comments: Stood and weight shifted, then stood again and took steps to turn to W/C.  TODAY'S TREATMENT:                                                                                                                              DATE:  05/08/23 NuStep  L5 x 4 minutes, then 3 x 30 sec with increased resistance, L7 B side stepping in front of mat using SPC, CGA, 7 steps each way B lateral lunge/return x 8 each side, SPC Stepped to W/C with SPC x 5', CGA  05/01/23 NuStep L5 x 5 min  then 2 x 30 sec at L7 for power. Strength re-assessment for PN Bed mob MI, Transfers MI Ambulated 1 x 40' with RW and S, improved stability.  04/22/23 Nustep level 3 x 8 minutes TUG 63 seconds with FWW Leg curls 20# Seated 5# rows and 5# extensions with pulleys Ball b/n knees squeeze Green tband clamshells Seated marches 3x10 3# LAQ 3x10 Ball in lap isometric abs Walking with CGA using FWW x 50 feet  04/08/23 NuStep L3 x 6 minutes Standing on Airex in parallel bars, performed B and ant/post weight shifts over feet, hands hovering over the bar. Stable. B side stepping in parallel bars with Red Tband positioned just above knees to add resistance to hip abd. 2 x 5 each way. Seated W/C push ups x 8 Seated reaching to each side, outside BOS x 5 each.  04/01/23 Nustep level 5 x 8 minutes TUG 65 seconds with FWW and SBA Seated ball b/n knees squeeze Red tband left hip adduction to isolate Seated marches In [employer], hip abduction and extension, some right knee pain with right leg extension In pbars practicing big steps In pbars sides stepping Pball isometric abs Leg curls 20# 2x10 Leg extension small ROM 5# 2x10 Ambulate FWW CGA 2x 20'  PATIENT EDUCATION:  Education details: POC Person educated: Patient Education method: Explanation Education comprehension: verbalized understanding  HOME EXERCISE PROGRAM: [id number]  ASSESSMENT:  CLINICAL IMPRESSION: Patient had another round of IVIG. She arrived a bit late due to transportation, but reports that she took some steps without an AD. Focused on strength and some gait training today.  OBJECTIVE IMPAIRMENTS: Abnormal gait, decreased activity tolerance, decreased balance, decreased coordination, decreased endurance, difficulty walking, decreased ROM, decreased strength, and postural dysfunction.   ACTIVITY LIMITATIONS: carrying, lifting, bending, standing, squatting, stairs, transfers, and locomotion level  PARTICIPATION  LIMITATIONS: meal prep, cleaning, laundry, driving, and shopping  PERSONAL FACTORS: Fitness, Past/current experiences, and 1 comorbidity: Recently diagnosed with GBS  are also affecting patient's functional outcome.   REHAB POTENTIAL: Good  CLINICAL DECISION MAKING: Evolving/moderate complexity  EVALUATION COMPLEXITY: Moderate   GOALS: Goals reviewed with patient? Yes  SHORT TERM GOALS: Target date: 11/28/22 I with initial HEP Baseline: Goal status: met 01/09/23  LONG TERM GOALS: Target date: 03/06/23  I with final HEP Baseline:  Goal status: 04/22/23, ongoing  2.  Decrease 5x STS to < 14 sec Baseline: unable to complete Goal status: 04/22/23-32 sec, ongoing  3.  Decrease TUG to < 20 sec Baseline: Unable to complete. Goal status: 02/18/23 1:55 04/01/23 = 65 seconds Ongoing  04/22/23 63 seconds  4.  Patient will ambulate at least 300' with LRAD, MI, on level and unlevel surfaces. Baseline: Stand pivot transfer with CGA Goal status: 02/18/23-20', RW, CGA, slow, but stable throughout. Ongoing 04/22/23  5.  Perform all bed mobility with I Baseline: Occ min A on mat Goal status: 02/18/23-met  6.  Increase BLE strength to at least 4/5 throughout. Baseline: (3-)-3/5 Goal status: 05/01/23-See MMT, ongoing.  7. Patient and husband will safely perform car transfers with LRAD Baseline: Currently uses van transportation Goal Status: 02/25/23 met  PLAN:  PT FREQUENCY: 1-2x/week  PT DURATION: 12 weeks  PLANNED  INTERVENTIONS: Therapeutic exercises, Therapeutic activity, Neuromuscular re-education, Balance training, Gait training, Patient/Family education, Self Care, Joint mobilization, Stair training, Dry Needling, Electrical stimulation, Cryotherapy, Moist heat, Ultrasound, Ionotophoresis 4mg /ml Dexamethasone, and Manual therapy  PLAN FOR NEXT SESSION:Would like to continue to push her strength, gait and overall function.  She is on a new medication and so far it makes her feel fatigued and  lethargic  Oley Balm DPT 05/08/23 10:19 AM

## 2023-05-13 ENCOUNTER — Ambulatory Visit: Payer: 59 | Admitting: Occupational Therapy

## 2023-05-13 ENCOUNTER — Encounter: Payer: Self-pay | Admitting: Occupational Therapy

## 2023-05-13 ENCOUNTER — Ambulatory Visit: Payer: 59 | Admitting: Physical Therapy

## 2023-05-13 ENCOUNTER — Encounter: Payer: Self-pay | Admitting: Physical Therapy

## 2023-05-13 DIAGNOSIS — R2681 Unsteadiness on feet: Secondary | ICD-10-CM

## 2023-05-13 DIAGNOSIS — R278 Other lack of coordination: Secondary | ICD-10-CM

## 2023-05-13 DIAGNOSIS — M6281 Muscle weakness (generalized): Secondary | ICD-10-CM

## 2023-05-13 DIAGNOSIS — R2689 Other abnormalities of gait and mobility: Secondary | ICD-10-CM

## 2023-05-13 DIAGNOSIS — G6181 Chronic inflammatory demyelinating polyneuritis: Secondary | ICD-10-CM

## 2023-05-13 DIAGNOSIS — R262 Difficulty in walking, not elsewhere classified: Secondary | ICD-10-CM

## 2023-05-13 NOTE — Therapy (Signed)
OUTPATIENT PHYSICAL THERAPY LOWER EXTREMITY TREATMENT  Progress Note Reporting Period 02/24/23 to 05/01/23  See note below for Objective Data and Assessment of Progress/Goals.      Patient Name: Dana [name] MRN: [id number] DOB:10/21/1965, 57 y.o., female Today's Date: 05/13/2023  END OF SESSION:  PT End of Session - 05/13/23 0938     Visit Number 22    Date for PT Re-Evaluation 05/23/23    PT Start Time 0931    PT Stop Time 1010    PT Time Calculation (min) 39 min    Equipment Utilized During Treatment Gait belt    Activity Tolerance Patient tolerated treatment well    Behavior During Therapy [facility] for tasks assessed/performed                Past Medical History:  Diagnosis Date   Anxiety    Back pain with radiation    Breast mass, right    Chronic pain    Chronic, continuous use of opioids    Complication of anesthesia 04/07/14   Allergic reaction to Lisinopril immediately following surgery   DJD (degenerative joint disease)    Fibromyalgia    Groin abscess    Headache(784.0)    History of IBS    Hypercholesterolemia    IBS (irritable bowel syndrome)    Lactose intolerance    Mild hypertension    Obesity    Tobacco use disorder    Umbilical hernia    Symptomatic   Past Surgical History:  Procedure Laterality Date   ABDOMINAL HYSTERECTOMY     ANTERIOR CERVICAL DECOMP/DISCECTOMY FUSION N/A 12/20/2015   Procedure: ANTERIOR CERVICAL DECOMPRESSION FUSION CERVICAL 4-5, CERVICAL 5-6, CERVICAL 6-7 WITH INSTRUMENTATION AND ALLOGRAFT;  Surgeon: Estill Bamberg, MD;  Location: [facility] OR;  Service: Orthopedics;  Laterality: N/A;  Anterior cervical decompression fusion, cervical 4-5, cervical 5-6, cervical 6-7 with instrumentation and allograft   BACK SURGERY     BILATERAL SALPINGECTOMY  09/03/2012   Procedure: BILATERAL SALPINGECTOMY;  Surgeon: Ok Edwards, MD;  Location: [facility] ORS;  Service: Gynecology;  Laterality: Bilateral;   BREAST BIOPSY Right 04/07/2014    Procedure: REMOVAL RIGHT BREAST MASS WITH WIRE LOCALIZATION;  Surgeon: Adolph Pollack, MD;  Location: [facility] OR;  Service: General;  Laterality: Right;   BREAST EXCISIONAL BIOPSY Left    x2   BREAST LUMPECTOMY     x2   CARPAL TUNNEL RELEASE Left 05/11/2019   Procedure: LEFT CARPAL TUNNEL RELEASE, RIGHT TENNIS ELBOW MARCAINE/DEPO MEDROL INJECTION UNDER ANESTHESIA;  Surgeon: Kerrin Champagne, MD;  Location: [facility] OR;  Service: Orthopedics;  Laterality: Left;   COLONOSCOPY W/ BIOPSIES  04/24/2012   per Dr. Leone Payor, clear, repeat in 10 yrs    disectomy     ESOPHAGOGASTRODUODENOSCOPY     FINGER SURGERY     Right index-excision of mass    FOOT SURGERY Right    Bone Spurs   IR FLUORO GUIDE CV LINE RIGHT  01/29/2023   IR REMOVAL TUN CV CATH W/O FL  02/08/2023   IR US GUIDE VASC ACCESS RIGHT  01/31/2023   LAPAROSCOPIC HYSTERECTOMY  09/03/2012   Procedure: HYSTERECTOMY TOTAL LAPAROSCOPIC;  Surgeon: Ok Edwards, MD;  Location: [facility] ORS;  Service: Gynecology;  Laterality: N/A;   LUMBAR DISC SURGERY     TUBAL LIGATION     UMBILICAL HERNIA REPAIR N/A 05/01/2018   Procedure: UMBILICAL HERNIA REPAIR;  Surgeon: Jimmye Norman, MD;  Location[facility];  Service: General;  Laterality: N/A;   Patient  Active Problem List   Diagnosis Date Noted   CIDP (chronic inflammatory demyelinating polyneuropathy) (HCC) 01/28/2023   Gait abnormality 01/28/2023   Glaucoma 01/28/2023   Pain due to onychomycosis of toenails of both feet 01/21/2023   Wheelchair dependence 12/30/2022   Nerve pain 12/30/2022   Insomnia due to medical condition 12/30/2022   Acute inflammatory demyelinating polyneuropathy (HCC) 10/01/2022   UTI (urinary tract infection) 09/24/2022   Hypomagnesemia 09/24/2022   Weakness 09/23/2022   Hypokalemia 09/23/2022   B12 deficiency 09/05/2022   Folate deficiency 09/05/2022   Carpal tunnel syndrome, left upper limb 05/11/2019    Class: Chronic   Spinal stenosis of lumbar region with neurogenic claudication  08/20/2018   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   Primary osteoarthritis of right knee 02/27/2017   Right tennis elbow 11/11/2016   Muscle cramps 01/15/2016   Bilateral leg edema 01/08/2016   Radiculopathy 12/20/2015   Hyperglycemia 12/21/2014   Mastodynia, female 11/30/2014   S/P excision of fibroadenoma of breast 11/30/2014   Angioedema of lips 04/07/2014   Fibroadenoma of right breast 03/07/2014   Pelvic pain in female 06/19/2012   Menorrhagia 06/11/2012   SUI (stress urinary incontinence, female) 06/11/2012   HTN (hypertension) 06/11/2012   IBS (irritable bowel syndrome) - diarrhea predominant 03/17/2012   MICROSCOPIC HEMATURIA 11/30/2010   BREAST PAIN, RIGHT 11/30/2010   BREAST MASS, RIGHT 01/12/2010   HYPERCHOLESTEROLEMIA 12/07/2007   CIGARETTE SMOKER 12/07/2007   DEGENERATIVE JOINT DISEASE 12/07/2007   Low back pain with sciatica 12/07/2007   HEADACHE 12/07/2007   Obesity, Class III, BMI 40-49.9 (morbid obesity) (HCC) 08/03/2007   Anxiety state 08/03/2007    PCP: Nelwyn Salisbury, MD  REFERRING PROVIDER: Nelwyn Salisbury, MD  REFERRING DIAG: G61.0 (ICD-10-CM) - Guillain Barr syndrome [facility])   THERAPY DIAG:  Muscle weakness (generalized)  Other lack of coordination  Unsteadiness on feet  CIDP (chronic inflammatory demyelinating polyneuropathy) (HCC)  Difficulty in walking, not elsewhere classified  Other abnormalities of gait and mobility  Rationale for Evaluation and Treatment: Rehabilitation  ONSET DATE: 12/05/22  SUBJECTIVE:   SUBJECTIVE STATEMENT: Patient reports that she feels a little weak today.  OBJECTIVE STATEMENT: 01/28/23 ASSESSMENT AND PLAN   Dana [name] is a 57 y.o. female   Demyelinating polyradiculoneuropathy             Symptom onset monophasic since September 2024, failed to respond to IVIG in January 2024,             EMG nerve conduction study showed demyelinating features, no evidence of axonal loss,             Talk with  neurohospitalist Dr. Iver Nestle, ED triage, will send her for hospital admission for plasma exchange, please add on following labs, immunofixative protein electrophoresis, ANA with reflex, SSA, SSB titer, iron panel             Starting outpatient IVIG prior authorization process,  TRESA SCOGIN is a 57 y.o. female who [facility] the [facility] ED on 09/23/2022 with bilateral lower extremity weakness with decreased mobility that has progressed to both arms. She also reported numbness and tingling of the extremities. She was transferred to [facility] for MRI. 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  Inpatient Rehab F/B HHPT.  R knee injection 10/08/22 trigger point injections for worsening back pain 2/15  RA  PAIN:  Are you having pain? Yes: NPRS scale: up to 10/10 Pain location: all over, but her R knee  pain is worst,  Pain description: Sharp Aggravating factors: Has difficulty standing up Relieving factors: Medicine helps somewhat  PRECAUTIONS: Fall  WEIGHT BEARING RESTRICTIONS: No  FALLS:  Has patient fallen in last 6 months? No  LIVING ENVIRONMENT: Lives with: lives with their family Lives in: House/apartment Stairs: Nohas a ramp Has following equipment at home: Environmental consultant - 2 wheeled, Wheelchair (power), Tour manager, bed side commode, Ramped entry, and [employer] lift  OCCUPATION: on disability due to RA and a fall several years ago  PLOF: Independent  PATIENT GOALS: walk  NEXT MD VISIT: She is scheduled to follow up with Dr. Serita Sheller with Physical Med/Rehab on 12-30-22. She is scheduled to follow up with Dr. Levert Feinstein at Neurology on 01-28-23.   OBJECTIVE:   DIAGNOSTIC FINDINGS:  Right knee radiographs 11/09/2020   FINDINGS: Severe patellofemoral joint space narrowing. Severe superior and mild inferior patellar degenerative osteophytosis. Moderate superior trochlear degenerative osteophytosis. No joint effusion. Severe medial compartment joint space narrowing  with moderate peripheral medial and lateral compartment degenerative osteophytes.  COGNITION: Overall cognitive status: Within functional limits for tasks assessed     SENSATION: Light touch: Impaired  and B hands and feet and lower legs  EDEMA:  BLE edema noted.  MUSCLE LENGTH: Hamstrings: WFL Thomas test: B tightness, to neutral  POSTURE: rounded shoulders and flexed trunk   PALPATION: No TTP  LOWER EXTREMITY ROM: BLE ROM limited in all planes an all joints due to body habitus   LOWER EXTREMITY MMT:  MMT Right eval Left eval R 05/01/23 L 05/01/23  Hip flexion 3 3- 4- 3+  Hip extension 3- 3- 3 3  Hip abduction 3 3- 3+ 3+  Hip adduction      Hip internal rotation      Hip external rotation      Knee flexion 4 4- 4 4  Knee extension 4- 4- 4 4  Ankle dorsiflexion 4- 4- 4 4  Ankle plantarflexion      Ankle inversion 4- 4-    Ankle eversion 4- 4-     (Blank rows = not tested)  FUNCTIONAL TESTS:  5 times sit to stand: unable to complete Timed up and go (TUG): unable to complete,  04/22/23  TUG 63 seconds with FWW  Bed Mobility: Occasional min A for sup <> sit, rolled B on mat with great effort, light min A  TRANSFERS: Scooting transfer with CGA, stand pivot with CGA, she reports that she sometimes has difficulty rising due to her knee pain  GAIT: Distance walked: 0' Assistive device utilized:  shopping cart-will bring her RW from home. Level of assistance: Min A Comments: Stood and weight shifted, then stood again and took steps to turn to W/C.  TODAY'S TREATMENT:                                                                                                                              DATE:  05/13/23 NuStep  L5 x 6 minutes Stand pivot transfers to and from the NuStep using cane with multiple small steps each time. Standing mini squats while holding 2# ball at chest, 2 x 5 reps Seated rotational walk outs, with opposite leg ext, 5 reps to each side Side step x 5  feet, using SPC and HHA,   05/08/23 NuStep L5 x 4 minutes, then 3 x 30 sec with increased resistance, L7 B side stepping in front of mat using SPC, CGA, 7 steps each way B lateral lunge/return x 8 each side, SPC Stepped to W/C with SPC x 5', CGA  05/01/23 NuStep L5 x 5 min then 2 x 30 sec at L7 for power. Strength re-assessment for PN Bed mob MI, Transfers MI Ambulated 1 x 40' with RW and S, improved stability.  04/22/23 Nustep level 3 x 8 minutes TUG 63 seconds with FWW Leg curls 20# Seated 5# rows and 5# extensions with pulleys Ball b/n knees squeeze Green tband clamshells Seated marches 3x10 3# LAQ 3x10 Ball in lap isometric abs Walking with CGA using FWW x 50 feet  04/08/23 NuStep L3 x 6 minutes Standing on Airex in parallel bars, performed B and ant/post weight shifts over feet, hands hovering over the bar. Stable. B side stepping in parallel bars with Red Tband positioned just above knees to add resistance to hip abd. 2 x 5 each way. Seated W/C push ups x 8 Seated reaching to each side, outside BOS x 5 each.  04/01/23 Nustep level 5 x 8 minutes TUG 65 seconds with FWW and SBA Seated ball b/n knees squeeze Red tband left hip adduction to isolate Seated marches In [employer], hip abduction and extension, some right knee pain with right leg extension In pbars practicing big steps In pbars sides stepping Pball isometric abs Leg curls 20# 2x10 Leg extension small ROM 5# 2x10 Ambulate FWW CGA 2x 20'  PATIENT EDUCATION:  Education details: POC Person educated: Patient Education method: Explanation Education comprehension: verbalized understanding  HOME EXERCISE PROGRAM: [id number]  ASSESSMENT:  CLINICAL IMPRESSION: Patient reports that she does not feel as well today. She became SOB with activity. Not sure if she is still feeling the IVIG therapy, will call Pharmacist. Continued to emphasize strengthening activities modified today for safety.  OBJECTIVE  IMPAIRMENTS: Abnormal gait, decreased activity tolerance, decreased balance, decreased coordination, decreased endurance, difficulty walking, decreased ROM, decreased strength, and postural dysfunction.   ACTIVITY LIMITATIONS: carrying, lifting, bending, standing, squatting, stairs, transfers, and locomotion level  PARTICIPATION LIMITATIONS: meal prep, cleaning, laundry, driving, and shopping  PERSONAL FACTORS: Fitness, Past/current experiences, and 1 comorbidity: Recently diagnosed with GBS  are also affecting patient's functional outcome.   REHAB POTENTIAL: Good  CLINICAL DECISION MAKING: Evolving/moderate complexity  EVALUATION COMPLEXITY: Moderate   GOALS: Goals reviewed with patient? Yes  SHORT TERM GOALS: Target date: 11/28/22 I with initial HEP Baseline: Goal status: met 01/09/23  LONG TERM GOALS: Target date: 03/06/23  I with final HEP Baseline:  Goal status: 04/22/23, ongoing  2.  Decrease 5x STS to < 14 sec Baseline: unable to complete Goal status: 04/22/23-32 sec, ongoing  3.  Decrease TUG to < 20 sec Baseline: Unable to complete. Goal status: 02/18/23 1:55 04/01/23 = 65 seconds Ongoing  04/22/23 63 seconds  4.  Patient will ambulate at least 300' with LRAD, MI, on level and unlevel surfaces. Baseline: Stand pivot transfer with CGA Goal status: 02/18/23-20', RW, CGA, slow, but stable throughout. Ongoing 04/22/23  5.  Perform all bed  mobility with I Baseline: Occ min A on mat Goal status: 02/18/23-met  6.  Increase BLE strength to at least 4/5 throughout. Baseline: (3-)-3/5 Goal status: 05/13/23-See MMT, ongoing.  7. Patient and husband will safely perform car transfers with LRAD Baseline: Currently uses van transportation Goal Status: 02/25/23 met  PLAN:  PT FREQUENCY: 1-2x/week  PT DURATION: 12 weeks  PLANNED INTERVENTIONS: Therapeutic exercises, Therapeutic activity, Neuromuscular re-education, Balance training, Gait training, Patient/Family education, Self Care,  Joint mobilization, Stair training, Dry Needling, Electrical stimulation, Cryotherapy, Moist heat, Ultrasound, Ionotophoresis 4mg /ml Dexamethasone, and Manual therapy  PLAN FOR NEXT SESSION:Would like to continue to push her strength, gait and overall function.  She is on a new medication and so far it makes her feel fatigued and lethargic  Oley Balm DPT 05/13/23 10:14 AM

## 2023-05-13 NOTE — Therapy (Unsigned)
OUTPATIENT OCCUPATIONAL THERAPY NEURO TREATMENT   Patient Name: Dana [name] MRN: [id number] DOB:November 27, 1965, 57 y.o., female Today's Date: 05/14/2023  PCP: Dr. Clent Ridges REFERRING PROVIDER: Dr. Gershon Crane  END OF SESSION:  OT End of Session - 05/13/23 1431     Visit Number 23    Number of Visits 40    Date for OT Re-Evaluation 07/08/23    Authorization Type UHC, Medicare    Authorization Time Period KX    Progress Note Due on Visit 33   renewal on visit 23   OT Start Time 0847    OT Stop Time 0927    OT Time Calculation (min) 40 min    Activity Tolerance Patient tolerated treatment well    Behavior During Therapy [facility] for tasks assessed/performed                      Past Medical History:  Diagnosis Date   Anxiety    Back pain with radiation    Breast mass, right    Chronic pain    Chronic, continuous use of opioids    Complication of anesthesia 04/07/14   Allergic reaction to Lisinopril immediately following surgery   DJD (degenerative joint disease)    Fibromyalgia    Groin abscess    Headache(784.0)    History of IBS    Hypercholesterolemia    IBS (irritable bowel syndrome)    Lactose intolerance    Mild hypertension    Obesity    Tobacco use disorder    Umbilical hernia    Symptomatic   Past Surgical History:  Procedure Laterality Date   ABDOMINAL HYSTERECTOMY     ANTERIOR CERVICAL DECOMP/DISCECTOMY FUSION N/A 12/20/2015   Procedure: ANTERIOR CERVICAL DECOMPRESSION FUSION CERVICAL 4-5, CERVICAL 5-6, CERVICAL 6-7 WITH INSTRUMENTATION AND ALLOGRAFT;  Surgeon: Estill Bamberg, MD;  Location: [facility] OR;  Service: Orthopedics;  Laterality: N/A;  Anterior cervical decompression fusion, cervical 4-5, cervical 5-6, cervical 6-7 with instrumentation and allograft   BACK SURGERY     BILATERAL SALPINGECTOMY  09/03/2012   Procedure: BILATERAL SALPINGECTOMY;  Surgeon: Ok Edwards, MD;  Location: [facility] ORS;  Service: Gynecology;  Laterality: Bilateral;   BREAST  BIOPSY Right 04/07/2014   Procedure: REMOVAL RIGHT BREAST MASS WITH WIRE LOCALIZATION;  Surgeon: Adolph Pollack, MD;  Location: [facility] OR;  Service: General;  Laterality: Right;   BREAST EXCISIONAL BIOPSY Left    x2   BREAST LUMPECTOMY     x2   CARPAL TUNNEL RELEASE Left 05/11/2019   Procedure: LEFT CARPAL TUNNEL RELEASE, RIGHT TENNIS ELBOW MARCAINE/DEPO MEDROL INJECTION UNDER ANESTHESIA;  Surgeon: Kerrin Champagne, MD;  Location: [facility] OR;  Service: Orthopedics;  Laterality: Left;   COLONOSCOPY W/ BIOPSIES  04/24/2012   per Dr. Leone Payor, clear, repeat in 10 yrs    disectomy     ESOPHAGOGASTRODUODENOSCOPY     FINGER SURGERY     Right index-excision of mass    FOOT SURGERY Right    Bone Spurs   IR FLUORO GUIDE CV LINE RIGHT  01/29/2023   IR REMOVAL TUN CV CATH W/O FL  02/08/2023   IR US GUIDE VASC ACCESS RIGHT  01/31/2023   LAPAROSCOPIC HYSTERECTOMY  09/03/2012   Procedure: HYSTERECTOMY TOTAL LAPAROSCOPIC;  Surgeon: Ok Edwards, MD;  Location: [facility] ORS;  Service: Gynecology;  Laterality: N/A;   LUMBAR DISC SURGERY     TUBAL LIGATION     UMBILICAL HERNIA REPAIR N/A 05/01/2018   Procedure: UMBILICAL HERNIA REPAIR;  Surgeon: Jimmye Norman, MD;  Location: [facility] OR;  Service: General;  Laterality: N/A;   Patient Active Problem List   Diagnosis Date Noted   CIDP (chronic inflammatory demyelinating polyneuropathy) (HCC) 01/28/2023   Gait abnormality 01/28/2023   Glaucoma 01/28/2023   Pain due to onychomycosis of toenails of both feet 01/21/2023   Wheelchair dependence 12/30/2022   Nerve pain 12/30/2022   Insomnia due to medical condition 12/30/2022   Acute inflammatory demyelinating polyneuropathy (HCC) 10/01/2022   UTI (urinary tract infection) 09/24/2022   Hypomagnesemia 09/24/2022   Weakness 09/23/2022   Hypokalemia 09/23/2022   B12 deficiency 09/05/2022   Folate deficiency 09/05/2022   Carpal tunnel syndrome, left upper limb 05/11/2019    Class: Chronic   Spinal stenosis of lumbar region  with neurogenic claudication 08/20/2018   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   Primary osteoarthritis of right knee 02/27/2017   Right tennis elbow 11/11/2016   Muscle cramps 01/15/2016   Bilateral leg edema 01/08/2016   Radiculopathy 12/20/2015   Hyperglycemia 12/21/2014   Mastodynia, female 11/30/2014   S/P excision of fibroadenoma of breast 11/30/2014   Angioedema of lips 04/07/2014   Fibroadenoma of right breast 03/07/2014   Pelvic pain in female 06/19/2012   Menorrhagia 06/11/2012   SUI (stress urinary incontinence, female) 06/11/2012   HTN (hypertension) 06/11/2012   IBS (irritable bowel syndrome) - diarrhea predominant 03/17/2012   MICROSCOPIC HEMATURIA 11/30/2010   BREAST PAIN, RIGHT 11/30/2010   BREAST MASS, RIGHT 01/12/2010   HYPERCHOLESTEROLEMIA 12/07/2007   CIGARETTE SMOKER 12/07/2007   DEGENERATIVE JOINT DISEASE 12/07/2007   Low back pain with sciatica 12/07/2007   HEADACHE 12/07/2007   Obesity, Class III, BMI 40-49.9 (morbid obesity) (HCC) 08/03/2007   Anxiety state 08/03/2007    ONSET DATE: 12/05/22- referral date  REFERRING DIAG: G61.0 (ICD-10-CM) - Guillain Barr syndrome (HCC)  THERAPY DIAG:    Rationale for Evaluation and Treatment: Rehabilitation  SUBJECTIVE:   SUBJECTIVE STATEMENT: Pt reports groin pain today after walking a lot yesterday PERTINENT HISTORY: Pt is a 57 y/o female hospitalized 09/23/22  with progressive weakness and sensation changes, consistent with Guillain-Barr syndrome. MRI negative. LP results and assessment most consistent with Guillain-Barr syndrome.  Pt was d/c home from CIR 11/06/22 PMH includes: glaucoma anxiety, DJD, fibromyalgia, HTN, tobacco use, B-12 deficiency, OA, ACDF 2017.  Pt was re-hospitalized 01/28/2456 with CIDP (chronic inflammatory demyelinating polyneuropathy). Saw Neuro on day of admission, NCS showed demyelination, referred to hospital for PLEX. Initially diagnosed with an acute demyelinating neuropathy  Jan 2024, treated with IVIG. PMH: Spinal stenosis, HTN, Obesity   PRECAUTIONS: Fall  WEIGHT BEARING RESTRICTIONS: no PAIN:  Are you having pain? Yes: NPRS scale: 4/10 Pain location: R groin Pain description: aching Aggravating factors: walking Relieving factors: rest      FALLS: Has patient fallen in last 6 months? No  LIVING ENVIRONMENT: Lives with: lives with their spouse Lives in: House/apartment Stairs: has ramp built Has following equipment at home:   BSC, steadi,  tub bench but can't access bathroom   PLOF: Independent  PATIENT GOALS: increase independence   OBJECTIVE:   HAND DOMINANCE: Right  ADLs: from initial eval Overall ADLs: assistance required, unable to access BR Transfers/ambulation related to ADLs: Eating: unable to cut food Grooming: set up UB Dressing: set up for bra and shirt LB Dressing: max A Toileting: uses BSC uses steadi Bathing: min-mod A in hospital  bed  Tub Shower transfers: n/a Equipment: Emergency planning/management officer  IADLs:dependent for IADLS   MOBILITY  STATUS:  uses w/c  , per PT report minguard for transfer from w/c to mat    ACTIVITY TOLERANCE: Activity tolerance: Pt fatigues quickly    UPPER EXTREMITY ROM:  see updates for 02/18/23 re-eval   Active ROM Right 02/18/23 Left 02/18/23  Shoulder flexion 130 120  Shoulder abduction 90 90  Shoulder adduction    Shoulder extension    Shoulder internal rotation    Shoulder external rotation    Elbow flexion    Elbow extension  -5  Wrist flexion    Wrist extension    Wrist ulnar deviation    Wrist radial deviation    Wrist pronation    Wrist supination    (Blank rows = not tested)  UPPER EXTREMITY MMT:   see below for re-eval 02/18/23  MMT Right 02/18/23 Left 02/18/23  Shoulder flexion 3+/5 3+/5  Shoulder abduction    Shoulder adduction    Shoulder extension    Shoulder internal rotation    Shoulder external rotation    Middle trapezius    Lower trapezius    Elbow flexion 4/5  4/5  Elbow extension 4-/5 4-/5  Wrist flexion    Wrist extension    Wrist ulnar deviation    Wrist radial deviation    Wrist pronation    Wrist supination      HAND FUNCTION: Grip strength: Right: 32 lbs; Left: 18 lbs, 02/18/23 R 55 lbs, L 35 lbs 05/13/23- grip RUE 42, LUE 34  COORDINATION: 9 Hole Peg test:initial eval  Right: 1 min 48 sec; Left: 4 pegs in 2 mins  Box and Blocks:  Right 35 blocks, Left  26 blocks 02/18/23- RUE 35.46, LUE 65 secs  SENSATION: Light touch: WFL Hot/Cold: Not tested    COGNITION: Overall cognitive status:NT   VISION: Subjective report: reports hx of glaucoma VISION ASSESSMENT: Not tested  OBSERVATIONS: Pt is eager to improve, pt's husband is very supportive   TODAY'S TREATMENT:                                                                                                                              DATE:05/13/23 Pt ambulated to and from arm bike with cane and min a grossly 3 ft.  Arm bike x 6 mins level 3 for conditioning Therapist checked progress towards goals and discussed pt performance towards goals. Pt would like to continue therapy. Pt stood for 1 min 18 secs for functional reaching prior to rest break. Pt stood a second time for grossly 1 min prior to rest break. Frequent rest breaks are required due to pt's recent IVIG, and pt states she gets fatigued more quickly.   05/08/23- Pt ambulated grossly 3 ft with cane with minguard to and from to arm bike. Arm bike x 6 mins level 1 for conditioning Pt copied small peg design with left and right UE's for increased fine motor coordination, mod difficulty for LUE and drops, occasional min difficulty with  RUE. Pt's LUE continues to fatigue quickly. Standing to perform functional reaching to retrieve items from chair to place on elevated surface with close supervision for standing balance and tolerance. Rest break required then pt was able to return items to lower surface with RUE with  supervision. Pt continues to fatigue quickly and she stood for less than a minute.    05/01/23- Arm bike x 6 mins level 2 for conditioning, pt walked to and from arm bike with walker and supervision. Placing and removing grooved pegs from pegboard with LUE for increased fine motor coordination, removing with in hand manipulation, min difficulty/ drops and v.c Placing graded clothespins 1-8# on vertical antennae with LUE then removing with RUE for sustained pinch, min v.c  Gripper set at level 2, pt picked up blocks with LUE for sustained grip, min difficulty/ drops   04/22/23:  This progress update covers dates of service from 6/4-04/22/23.   Started session working on LUE coordination.  Copying small peg patterns.  Patient with frequent drops, difficulty orienting small pegs in hand.  Patient with report of diminished sensation in left hand.  Attempted same task with left hand in glove with slightly increased ability initially - however - not able to sustain.   Worked on hand strengthening with gripper level 2 x 20 blocks.  Faciliatation an dcueing to reduce trunk lateral flexion and shoulder elevation.  Attempted 5 blocks at level 3 - fatigued after 3 blocks.   Standing balance/ tolerance at counter - working toward reducing UE reliance - reaching into chest high shelf, reaching to shelf at eye level.  Stand with walker and taking slow cautious steps to counter.    04/08/23-Pt ambulated approximately 5 feet to walk to arm bike with rolling walker with close supervision. Arm bike x 6 mins level 1 for conditioning Min a for sit to stand from armbike then pt ambulated back to w/c. Seated in w/c closed chain shoulder flexion, with unweighted ball followed by shoulder flexion then trunk rotation and chest press wih 2.2 lbs weighted ball for UE strength and endurance, rest between sets. Fine motor coordination task to copy small peg design with LUE, min-mod difficulty, v.c to avoid shoulder hike/  compensation.  04/01/23- Seated closed chain shoulder flexion, with unweighted ball followed by shoulder flexion then trunk rotation and chest press wih 2.2 lbs weighted ball. Rest in between sets seated on mat. Pt walker to mat with walker and supervision. Standing at countertop functional reaching to place graded clothespins in targets with RUE, rest break then pt removed with left hand, close supervision. Discussed plans to continue OT after next week. Pt walked mat to w/c with walker and close supervision .   03/27/23-Theraband exercises for biceps curls, triceps, rowing  and shoulder extension with green band and shoulder abduction with red band 10-15 reps each, min v.c, rest break in between sets Standing at countertop to place and remove items from lowest overhead shelf, seated rest break prior to replacing, close supervision for moblity with walker  Gripper set at level 2 sustained grip to pick up 1 inch blocks, 1/2 with RUE with min difficulty and 1/2 with left with min-mod difficulty for sustained grip Graded clothespins for sustained pinch 1-8# using left and right UE's for sustained pinch, RUE only with black clothespins dur to difficulty. Therapist recommends that pt orders a long handled sponge to apply lotion onto legs. Pt with continued bilateral LE swelling.  03/25/23 Pt is hoarse today and reports a  tickling in her throat as well as LE swelling. Therapist recommends pt calls MD.Pt arrived late as her transportation did not pick her up. Arm bike x 6 mins level 1 for conditioning.Pt transferred to and from arm bike to w/c with min A. Copying small peg design for increased fine motor coordination with LUE, min difficulty and increased time, removing with in hand manipulation. Pt demonstrates improved fine motor coordination.  03/18/23- Pt amb across gym with RW and minguard to arm bike.  Arm bike x 7 mins level 1  for conditioning Placing small pegs into pegboard with LUE for increased  fine motor coordination, pt with improved performance and coordination today. Pt walked 3 ft from chair to counter and stood to place and remove items from overhead shelves with 1 seated rest break. Discussion with patient regarding her home situation,. Pt reports frustration and she feels like she is a burden on husband. Therapist recommends pt seeks counseling. Shoulder abduction  with red band biceps curls with green band  20 reps for UE strengthening   03/13/23-Pt reports she had a stomach bug and she wasn't feeling well. Placing large pegs into pegboard with LUE with occasional min difficulty, then removing with 3 pt pinch min v.c to avoid shoulder hike. Red theraband exercises for shoulder abduction, rowing and extension 15 reps each,  green band for biceps curls and triceps extension 15 reps each, min v.c Arm bike x 6 mins level 3 for conditioning, pt transfered to and from with min A Discussed safety for cooking and recommendation that if pt decides to fry fish, she performs with someone for safety, and she keeps her LUE away from hot surfaces due to sensory deficits. Pt verbalized understanding.   03/04/23 Red theraband exercises for shoulder abduction, rowing and extension 20 reps each, upgraded to green band for biceps curls and triceps extension 20 reps each, min v.c Arm bike x 8 mins, 4 mins each direction, level 2 for conditioning, pt transferred to and from seat with supervision. Coordination activities with LUE: flipping and dealing cards, min difficulty/ v.c  02/27/23-Arm bike x 6 mins level 2 for conditioning (Pt transfers to and from w/c with supervision) Placing large pegs into pegboard with LUE then removing for increased fine motor coordination, min difficulty/ v.c  Standing at countertop with supervision to place and remove items with right then left UE's, pt stood x 3 for 30 secs to 1 min 15 secs, rest breaks in between Gripper set at level 1 to pick up 1 inch blocks  with  right and left UE's for sustained grip, min difficulty Pt reports amb to BR with walker without assist, therapist recommends supervision for safety.  02/25/23-Arm bike x 6 mins level 2 for conditioning, Pt transferred to the seat for the first time with supervision Reviewed previously issued red theraband exercises for UE strength 20 reps each, min v.c  Fine motor coordination exercises with left and right UE's, flipping playing cards and stacking coins with min difficulty using RUE, picking up coins and placing in container with LUE and flipping cards mod difficulty. Pt attempted placing small pegs into a pegboard with bilateral UE's max difficulty with LUE, mod difficulty with RUE, task was discontinued. Pt reports being a little off. She took her meds without eating and got into a disagreement with transportation this a.m.    02/18/23- re-eval performed and therapist discussed potential goals with patient.  PATIENT EDUCATION: Education details:    progress towards goals and plans  to renew Person educated: Patient Education method: explanation, demonstration Education comprehension: verbalized understanding, returned demonstration.  HOME EXERCISE PROGRAM:  12/17/22- coordination HEP, cane exercises seated   GOALS: Goals reviewed with patient? Yes  SHORT TERM GOALS: Target date: 06/12/23  I with initial HEP Baseline: Goal status: met 01/21/23  2.  Pt will demonstrate improved UE functional use as evidenced increasing bilateral box/ blocks score by 4 blocks.  Baseline: R 35, L 26 Goal status  met 12/31/22 RUE :43:  LUE 35    3.  Pt will donn pants with mod A. Baseline: max A Goal status:met  02/18/23- pt performed mod I today  4. Pt will verbalize understanding of adapted strategies to maximize safety and I with ADLs/ IADLs .  Goal status:  ongoing, has tub bench and long handled sponge  8/6  ongoing - patient relies on husband to lift legs into tub - encouraging patient to increase  participation with IADL at home.    5.  Pt will demonstrate ability to stand for functional activity with min A x 10 mins in prep for ADLs.  Revised short term goal 05/13/23 -Pt will stand for 5 mins prior to rest break in prep for ADLS.  Goal status: ongoing pt stood for 1 min on 02/18/23 8/6:  Stood x 2 min in clinic - patient reporting increased fatigue due to IVIG treatment.  05/13/23-Stood 1 min 18 secs  6.  Pt will perform w/c to [facility] transfers mod I-  mod I per pt report 05/08/23  Goal status:  met, 02/18/23 7. Pt will increase bilateral grip strength by 5 lbs for increased functional use of UE's  Baseline 05/13/23-RUE 42, LUE 34  Goal status: new     LONG TERM GOALS: Target date: 07/08/23 I with updated HEP  Goal status: ongoing, 05/13/23  2.  Pt will perform LB dressing mod I. Baseline:max A Goal status:met 03/25/23- 8/6- patient indicates she is dressing herself.   3.  Pt will bathe with supervision/ set up. Baseline: min-mod A for sponge bath Goal status: duplicate goal, therefore deferred,  see below, pt met for sponge bathing.  4.  Pt will perform basic cooking at a w/c level mod I  Revised goal: Pt will perform basic cooking modified independently at a walker level.  Goal status:  Ongoing, pt is currently performing at a w/c level 05/13/23, not walking in kitchen  5.  Pt will demonstrate improved fine motor coordination as evidenced by performing LUE 9 hole peg test in 2 mins or less. - initial goal met Revised goal:Pt will demonstrate improved fine motor coordination as evidenced by performing 9 hole peg test in 55 secs or less  8/6- 63 seconds- ongoing  Goal status: ongoing- 1 min 17 secs on 05/13/23  6.  Pt will demonstrate improved fine motor coordination as evidenced by performing RUE 9 hole peg test in 1 mins  30 secs or less.  Baseline: RUE  1 min 48 secs, LUE 4 pegs placed in 2 mins  Goal status: met 6/4/246/4/24- RUE 35.46,     7. Pt will perform shower with  supervision using tub transfer bench Goal status: ongoing,  partially met pt has assist with lifting legs into tub, then she completes bathing at a supervision level, has assist with drying 05/13/23    8. Pt will perform basic home management at a walker level mod I    Goal status: ongoing, 05/13/23- still performing at w/c level, not consistent  CLINICAL IMPRESSION: Pt demonstrates good overall progress. She has achieved 4/6 short term goals and 3/7 long term goals(1 goal deferred due to duplication). Pt progress has been limited by breaks in therapy for IVIG treatments and pt is more deconditioned following treatments. Pt continues to demonstrate the following deficits: decreased strength, decreased coordination, decreased balance, decreased activity tolerance, decreased functional mobility which impedes performance of ADLs/IADLs. Pt can benefit from continued skilled occupational therapy to address these deficits.  PERFORMANCE DEFICITS: in functional skills including ADLs, IADLs, coordination, dexterity, sensation, ROM, strength, flexibility, Fine motor control, Gross motor control, mobility, balance, endurance, decreased knowledge of precautions, decreased knowledge of use of DME, and UE functional use, cognitive skills including  and psychosocial skills including coping strategies, environmental adaptation, habits, interpersonal interactions, and routines and behaviors.   IMPAIRMENTS: are limiting patient from ADLs, IADLs, rest and sleep, play, leisure, and social participation.   CO-MORBIDITIES: may have co-morbidities  that affects occupational performance. Patient will benefit from skilled OT to address above impairments and improve overall function.  MODIFICATION OR ASSISTANCE TO COMPLETE EVALUATION: No modification of tasks or assist necessary to complete an evaluation.  OT OCCUPATIONAL PROFILE AND HISTORY: Detailed assessment: Review of records and additional review of physical,  cognitive, psychosocial history related to current functional performance.  CLINICAL DECISION MAKING: LOW - limited treatment options, no task modification necessary  REHAB POTENTIAL: Good  EVALUATION COMPLEXITY: Low    PLAN:  OT FREQUENCY: 2x/week  OT DURATION: 8 weeks  PLANNED INTERVENTIONS: self care/ADL training, therapeutic exercise, therapeutic activity, neuromuscular re-education, manual therapy, passive range of motion, gait training, balance training, stair training, functional mobility training, aquatic therapy, ultrasound, paraffin, moist heat, cryotherapy, patient/family education, energy conservation, coping strategies training, DME and/or AE instructions, and Re-evaluation  RECOMMENDED OTHER SERVICES: PT  CONSULTED AND AGREED WITH PLAN OF CARE: Patient and family member/caregiver  PLAN FOR NEXT SESSION: continue to work towards updated goals  Hawkin Charo, OT 05/14/2023, 9:14 AM

## 2023-05-14 ENCOUNTER — Ambulatory Visit: Payer: Medicare Other | Admitting: Podiatry

## 2023-05-15 ENCOUNTER — Ambulatory Visit (INDEPENDENT_AMBULATORY_CARE_PROVIDER_SITE_OTHER): Payer: 59 | Admitting: Surgical

## 2023-05-15 ENCOUNTER — Ambulatory Visit: Payer: 59 | Admitting: Occupational Therapy

## 2023-05-15 ENCOUNTER — Ambulatory Visit: Payer: 59 | Admitting: Physical Therapy

## 2023-05-15 DIAGNOSIS — M1711 Unilateral primary osteoarthritis, right knee: Secondary | ICD-10-CM

## 2023-05-15 DIAGNOSIS — M1712 Unilateral primary osteoarthritis, left knee: Secondary | ICD-10-CM | POA: Diagnosis not present

## 2023-05-16 ENCOUNTER — Encounter: Payer: Self-pay | Admitting: Orthopedic Surgery

## 2023-05-16 MED ORDER — BUPIVACAINE HCL 0.25 % IJ SOLN
4.0000 mL | INTRAMUSCULAR | Status: AC | PRN
Start: 2023-05-15 — End: 2023-05-15
  Administered 2023-05-15: 4 mL via INTRA_ARTICULAR

## 2023-05-16 MED ORDER — LIDOCAINE HCL 1 % IJ SOLN
5.0000 mL | INTRAMUSCULAR | Status: AC | PRN
Start: 2023-05-15 — End: 2023-05-15
  Administered 2023-05-15: 5 mL

## 2023-05-16 MED ORDER — METHYLPREDNISOLONE ACETATE 40 MG/ML IJ SUSP
40.0000 mg | INTRAMUSCULAR | Status: AC | PRN
Start: 2023-05-15 — End: 2023-05-15
  Administered 2023-05-15: 40 mg via INTRA_ARTICULAR

## 2023-05-16 NOTE — Progress Notes (Signed)
   Procedure Note  [name]             Date of Birth: 1965-12-06           MRN: [id number]             Visit Date: 05/15/2023  Procedures: Visit Diagnoses:  1. Unilateral primary osteoarthritis, left knee   2. Unilateral primary osteoarthritis, right knee     Large Joint Inj: bilateral knee on 05/15/2023 7:56 AM Indications: diagnostic evaluation, joint swelling and pain Details: 18 G 1.5 in needle, superolateral approach  Arthrogram: No  Medications (Right): 5 mL lidocaine 1 %; 4 mL bupivacaine 0.25 %; 40 mg methylPREDNISolone acetate 40 MG/ML Medications (Left): 5 mL lidocaine 1 %; 4 mL bupivacaine 0.25 %; 40 mg methylPREDNISolone acetate 40 MG/ML Outcome: tolerated well, no immediate complications Procedure, treatment alternatives, risks and benefits explained, specific risks discussed. Consent was given by the patient. Immediately prior to procedure a time out was called to verify the correct patient, procedure, equipment, support staff and site/side marked as required. Patient was prepped and draped in the usual sterile fashion.

## 2023-05-19 ENCOUNTER — Other Ambulatory Visit: Payer: Self-pay | Admitting: Physical Medicine and Rehabilitation

## 2023-05-20 ENCOUNTER — Telehealth: Payer: Self-pay | Admitting: Family Medicine

## 2023-05-20 ENCOUNTER — Ambulatory Visit: Payer: 59 | Admitting: Physical Therapy

## 2023-05-20 NOTE — Telephone Encounter (Signed)
Requesting refill Oxycodone HCl 20 MG TABS also says she is sick congestion, cough, headache x 3d. Declines OV

## 2023-05-20 NOTE — Telephone Encounter (Signed)
Pt is going out of town on thursday

## 2023-05-22 ENCOUNTER — Ambulatory Visit: Payer: 59 | Admitting: Physical Therapy

## 2023-05-22 ENCOUNTER — Ambulatory Visit: Payer: 59 | Admitting: Occupational Therapy

## 2023-05-23 MED ORDER — OXYCODONE HCL 20 MG PO TABS: 20.0000 mg | ORAL_TABLET | Freq: Four times a day (QID) | ORAL | 0 refills | Status: DC | PRN

## 2023-05-23 NOTE — Telephone Encounter (Signed)
I sent in #[phone]. No more refills after that without a PMV

## 2023-05-23 NOTE — Telephone Encounter (Signed)
Pt notified via My Chart

## 2023-05-23 NOTE — Addendum Note (Signed)
Addended by: Gershon Crane A on: 05/23/2023 07:52 AM   Modules accepted: Orders

## 2023-05-27 ENCOUNTER — Ambulatory Visit: Payer: 59 | Admitting: Occupational Therapy

## 2023-05-27 NOTE — Therapy (Deleted)
OUTPATIENT OCCUPATIONAL THERAPY NEURO TREATMENT   Patient Name: Dana [name] MRN: [id number] DOB:[birth year], 57 y.o., female Today's Date: 05/27/2023  PCP: Dr. Clent Ridges REFERRING PROVIDER: Dr. Gershon Crane  END OF SESSION:             Past Medical History:  Diagnosis Date   Anxiety    Back pain with radiation    Breast mass, right    Chronic pain    Chronic, continuous use of opioids    Complication of anesthesia 04/07/14   Allergic reaction to Lisinopril immediately following surgery   DJD (degenerative joint disease)    Fibromyalgia    Groin abscess    Headache(784.0)    History of IBS    Hypercholesterolemia    IBS (irritable bowel syndrome)    Lactose intolerance    Mild hypertension    Obesity    Tobacco use disorder    Umbilical hernia    Symptomatic   Past Surgical History:  Procedure Laterality Date   ABDOMINAL HYSTERECTOMY     ANTERIOR CERVICAL DECOMP/DISCECTOMY FUSION N/A 12/20/2015   Procedure: ANTERIOR CERVICAL DECOMPRESSION FUSION CERVICAL 4-5, CERVICAL 5-6, CERVICAL 6-7 WITH INSTRUMENTATION AND ALLOGRAFT;  Surgeon: Estill Bamberg, MD;  Location: [facility] OR;  Service: Orthopedics;  Laterality: N/A;  Anterior cervical decompression fusion, cervical 4-5, cervical 5-6, cervical 6-7 with instrumentation and allograft   BACK SURGERY     BILATERAL SALPINGECTOMY  09/03/2012   Procedure: BILATERAL SALPINGECTOMY;  Surgeon: Ok Edwards, MD;  Location: [facility];  Service: Gynecology;  Laterality: Bilateral;   BREAST BIOPSY Right 04/07/2014   Procedure: REMOVAL RIGHT BREAST MASS WITH WIRE LOCALIZATION;  Surgeon: Adolph Pollack, MD;  Location: [facility] OR;  Service: General;  Laterality: Right;   BREAST EXCISIONAL BIOPSY Left    x2   BREAST LUMPECTOMY     x2   CARPAL TUNNEL RELEASE Left 05/11/2019   Procedure: LEFT CARPAL TUNNEL RELEASE, RIGHT TENNIS ELBOW MARCAINE/DEPO MEDROL INJECTION UNDER ANESTHESIA;  Surgeon: Kerrin Champagne, MD;  Location: [facility] OR;  Service:  Orthopedics;  Laterality: Left;   COLONOSCOPY W/ BIOPSIES  04/24/2012   per Dr. Leone Payor, clear, repeat in 10 yrs    disectomy     ESOPHAGOGASTRODUODENOSCOPY     FINGER SURGERY     Right index-excision of mass    FOOT SURGERY Right    Bone Spurs   IR FLUORO GUIDE CV LINE RIGHT  01/29/2023   IR REMOVAL TUN CV CATH W/O FL  02/08/2023   IR US GUIDE VASC ACCESS RIGHT  01/31/2023   LAPAROSCOPIC HYSTERECTOMY  09/03/2012   Procedure: HYSTERECTOMY TOTAL LAPAROSCOPIC;  Surgeon: Ok Edwards, MD;  Location: [facility];  Service: Gynecology;  Laterality: N/A;   LUMBAR DISC SURGERY     TUBAL LIGATION     UMBILICAL HERNIA REPAIR N/A 05/01/2018   Procedure: UMBILICAL HERNIA REPAIR;  Surgeon: Jimmye Norman, MD;  Location: [facility] OR;  Service: General;  Laterality: N/A;   Patient Active Problem List   Diagnosis Date Noted   CIDP (chronic inflammatory demyelinating polyneuropathy) (HCC) 01/28/2023   Gait abnormality 01/28/2023   Glaucoma 01/28/2023   Pain due to onychomycosis of toenails of both feet 01/21/2023   Wheelchair dependence 12/30/2022   Nerve pain 12/30/2022   Insomnia due to medical condition 12/30/2022   Acute inflammatory demyelinating polyneuropathy (HCC) 10/01/2022   UTI (urinary tract infection) 09/24/2022   Hypomagnesemia 09/24/2022   Weakness 09/23/2022   Hypokalemia 09/23/2022   B12 deficiency 09/05/2022   Folate  deficiency 09/05/2022   Carpal tunnel syndrome, left upper limb 05/11/2019    Class: Chronic   Spinal stenosis of lumbar region with neurogenic claudication 08/20/2018   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   Primary osteoarthritis of right knee 02/27/2017   Right tennis elbow 11/11/2016   Muscle cramps 01/15/2016   Bilateral leg edema 01/08/2016   Radiculopathy 12/20/2015   Hyperglycemia 12/21/2014   Mastodynia, female 11/30/2014   S/P excision of fibroadenoma of breast 11/30/2014   Angioedema of lips 04/07/2014   Fibroadenoma of right breast 03/07/2014    Pelvic pain in female 06/19/2012   Menorrhagia 06/11/2012   SUI (stress urinary incontinence, female) 06/11/2012   HTN (hypertension) 06/11/2012   IBS (irritable bowel syndrome) - diarrhea predominant 03/17/2012   MICROSCOPIC HEMATURIA 11/30/2010   BREAST PAIN, RIGHT 11/30/2010   BREAST MASS, RIGHT 01/12/2010   HYPERCHOLESTEROLEMIA 12/07/2007   CIGARETTE SMOKER 12/07/2007   DEGENERATIVE JOINT DISEASE 12/07/2007   Low back pain with sciatica 12/07/2007   HEADACHE 12/07/2007   Obesity, Class III, BMI 40-49.9 (morbid obesity) (HCC) 08/03/2007   Anxiety state 08/03/2007    ONSET DATE: 12/05/22- referral date  REFERRING DIAG: G61.0 (ICD-10-CM) - Guillain Barr syndrome (HCC)  THERAPY DIAG:    Rationale for Evaluation and Treatment: Rehabilitation  SUBJECTIVE:   SUBJECTIVE STATEMENT: Pt reports groin pain today after walking a lot yesterday PERTINENT HISTORY: Pt is a 57 y/o female hospitalized 09/23/22  with progressive weakness and sensation changes, consistent with Guillain-Barr syndrome. MRI negative. LP results and assessment most consistent with Guillain-Barr syndrome.  Pt was d/c home from CIR 11/06/22 PMH includes: glaucoma anxiety, DJD, fibromyalgia, HTN, tobacco use, B-12 deficiency, OA, ACDF 2017.  Pt was re-hospitalized 01/28/2456 with CIDP (chronic inflammatory demyelinating polyneuropathy). Saw Neuro on day of admission, NCS showed demyelination, referred to hospital for PLEX. Initially diagnosed with an acute demyelinating neuropathy Jan 2024, treated with IVIG. PMH: Spinal stenosis, HTN, Obesity   PRECAUTIONS: Fall  WEIGHT BEARING RESTRICTIONS: no PAIN:  Are you having pain? Yes: NPRS scale: 4/10 Pain location: R groin Pain description: aching Aggravating factors: walking Relieving factors: rest      FALLS: Has patient fallen in last 6 months? No  LIVING ENVIRONMENT: Lives with: lives with their spouse Lives in: House/apartment Stairs: has ramp built Has  following equipment at home:   BSC, steadi,  tub bench but can't access bathroom   PLOF: Independent  PATIENT GOALS: increase independence   OBJECTIVE:   HAND DOMINANCE: Right  ADLs: from initial eval Overall ADLs: assistance required, unable to access BR Transfers/ambulation related to ADLs: Eating: unable to cut food Grooming: set up UB Dressing: set up for bra and shirt LB Dressing: max A Toileting: uses BSC uses steadi Bathing: min-mod A in hospital  bed  Tub Shower transfers: n/a Equipment: Emergency planning/management officer  IADLs:dependent for IADLS   MOBILITY STATUS:  uses w/c  , per PT report minguard for transfer from w/c to mat    ACTIVITY TOLERANCE: Activity tolerance: Pt fatigues quickly    UPPER EXTREMITY ROM:  see updates for 02/18/23 re-eval   Active ROM Right 02/18/23 Left 02/18/23  Shoulder flexion 130 120  Shoulder abduction 90 90  Shoulder adduction    Shoulder extension    Shoulder internal rotation    Shoulder external rotation    Elbow flexion    Elbow extension  -5  Wrist flexion    Wrist extension    Wrist ulnar deviation    Wrist radial deviation  Wrist pronation    Wrist supination    (Blank rows = not tested)  UPPER EXTREMITY MMT:   see below for re-eval 02/18/23  MMT Right 02/18/23 Left 02/18/23  Shoulder flexion 3+/5 3+/5  Shoulder abduction    Shoulder adduction    Shoulder extension    Shoulder internal rotation    Shoulder external rotation    Middle trapezius    Lower trapezius    Elbow flexion 4/5 4/5  Elbow extension 4-/5 4-/5  Wrist flexion    Wrist extension    Wrist ulnar deviation    Wrist radial deviation    Wrist pronation    Wrist supination      HAND FUNCTION: Grip strength: Right: 32 lbs; Left: 18 lbs, 02/18/23 R 55 lbs, L 35 lbs 05/13/23- grip RUE 42, LUE 34  COORDINATION: 9 Hole Peg test:initial eval  Right: 1 min 48 sec; Left: 4 pegs in 2 mins  Box and Blocks:  Right 35 blocks, Left  26 blocks 02/18/23- RUE 35.46,  LUE 65 secs  SENSATION: Light touch: WFL Hot/Cold: Not tested    COGNITION: Overall cognitive status:NT   VISION: Subjective report: reports hx of glaucoma VISION ASSESSMENT: Not tested  OBSERVATIONS: Pt is eager to improve, pt's husband is very supportive   TODAY'S TREATMENT:                                                                                                                              DATE:05/13/23 Pt ambulated to and from arm bike with cane and min a grossly 3 ft.  Arm bike x 6 mins level 3 for conditioning Therapist checked progress towards goals and discussed pt performance towards goals. Pt would like to continue therapy. Pt stood for 1 min 18 secs for functional reaching prior to rest break. Pt stood a second time for grossly 1 min prior to rest break. Frequent rest breaks are required due to pt's recent IVIG, and pt states she gets fatigued more quickly.   05/08/23- Pt ambulated grossly 3 ft with cane with minguard to and from to arm bike. Arm bike x 6 mins level 1 for conditioning Pt copied small peg design with left and right UE's for increased fine motor coordination, mod difficulty for LUE and drops, occasional min difficulty with RUE. Pt's LUE continues to fatigue quickly. Standing to perform functional reaching to retrieve items from chair to place on elevated surface with close supervision for standing balance and tolerance. Rest break required then pt was able to return items to lower surface with RUE with supervision. Pt continues to fatigue quickly and she stood for less than a minute.    05/01/23- Arm bike x 6 mins level 2 for conditioning, pt walked to and from arm bike with walker and supervision. Placing and removing grooved pegs from pegboard with LUE for increased fine motor coordination, removing with in hand manipulation, min difficulty/ drops and v.c Placing graded clothespins  1-8# on vertical antennae with LUE then removing with RUE for  sustained pinch, min v.c  Gripper set at level 2, pt picked up blocks with LUE for sustained grip, min difficulty/ drops   04/22/23:  This progress update covers dates of service from 6/4-04/22/23.   Started session working on LUE coordination.  Copying small peg patterns.  Patient with frequent drops, difficulty orienting small pegs in hand.  Patient with report of diminished sensation in left hand.  Attempted same task with left hand in glove with slightly increased ability initially - however - not able to sustain.   Worked on hand strengthening with gripper level 2 x 20 blocks.  Faciliatation an dcueing to reduce trunk lateral flexion and shoulder elevation.  Attempted 5 blocks at level 3 - fatigued after 3 blocks.   Standing balance/ tolerance at counter - working toward reducing UE reliance - reaching into chest high shelf, reaching to shelf at eye level.  Stand with walker and taking slow cautious steps to counter.    04/08/23-Pt ambulated approximately 5 feet to walk to arm bike with rolling walker with close supervision. Arm bike x 6 mins level 1 for conditioning Min a for sit to stand from armbike then pt ambulated back to w/c. Seated in w/c closed chain shoulder flexion, with unweighted ball followed by shoulder flexion then trunk rotation and chest press wih 2.2 lbs weighted ball for UE strength and endurance, rest between sets. Fine motor coordination task to copy small peg design with LUE, min-mod difficulty, v.c to avoid shoulder hike/ compensation.  04/01/23- Seated closed chain shoulder flexion, with unweighted ball followed by shoulder flexion then trunk rotation and chest press wih 2.2 lbs weighted ball. Rest in between sets seated on mat. Pt walker to mat with walker and supervision. Standing at countertop functional reaching to place graded clothespins in targets with RUE, rest break then pt removed with left hand, close supervision. Discussed plans to continue OT after next week. Pt  walked mat to w/c with walker and close supervision .   03/27/23-Theraband exercises for biceps curls, triceps, rowing  and shoulder extension with green band and shoulder abduction with red band 10-15 reps each, min v.c, rest break in between sets Standing at countertop to place and remove items from lowest overhead shelf, seated rest break prior to replacing, close supervision for moblity with walker  Gripper set at level 2 sustained grip to pick up 1 inch blocks, 1/2 with RUE with min difficulty and 1/2 with left with min-mod difficulty for sustained grip Graded clothespins for sustained pinch 1-8# using left and right UE's for sustained pinch, RUE only with black clothespins dur to difficulty. Therapist recommends that pt orders a long handled sponge to apply lotion onto legs. Pt with continued bilateral LE swelling.  03/25/23 Pt is hoarse today and reports a tickling in her throat as well as LE swelling. Therapist recommends pt calls MD.Pt arrived late as her transportation did not pick her up. Arm bike x 6 mins level 1 for conditioning.Pt transferred to and from arm bike to w/c with min A. Copying small peg design for increased fine motor coordination with LUE, min difficulty and increased time, removing with in hand manipulation. Pt demonstrates improved fine motor coordination.  03/18/23- Pt amb across gym with RW and minguard to arm bike.  Arm bike x 7 mins level 1  for conditioning Placing small pegs into pegboard with LUE for increased fine motor coordination, pt with improved performance  and coordination today. Pt walked 3 ft from chair to counter and stood to place and remove items from overhead shelves with 1 seated rest break. Discussion with patient regarding her home situation,. Pt reports frustration and she feels like she is a burden on husband. Therapist recommends pt seeks counseling. Shoulder abduction  with red band biceps curls with green band  20 reps for UE  strengthening   03/13/23-Pt reports she had a stomach bug and she wasn't feeling well. Placing large pegs into pegboard with LUE with occasional min difficulty, then removing with 3 pt pinch min v.c to avoid shoulder hike. Red theraband exercises for shoulder abduction, rowing and extension 15 reps each,  green band for biceps curls and triceps extension 15 reps each, min v.c Arm bike x 6 mins level 3 for conditioning, pt transfered to and from with min A Discussed safety for cooking and recommendation that if pt decides to fry fish, she performs with someone for safety, and she keeps her LUE away from hot surfaces due to sensory deficits. Pt verbalized understanding.   03/04/23 Red theraband exercises for shoulder abduction, rowing and extension 20 reps each, upgraded to green band for biceps curls and triceps extension 20 reps each, min v.c Arm bike x 8 mins, 4 mins each direction, level 2 for conditioning, pt transferred to and from seat with supervision. Coordination activities with LUE: flipping and dealing cards, min difficulty/ v.c  02/27/23-Arm bike x 6 mins level 2 for conditioning (Pt transfers to and from w/c with supervision) Placing large pegs into pegboard with LUE then removing for increased fine motor coordination, min difficulty/ v.c  Standing at countertop with supervision to place and remove items with right then left UE's, pt stood x 3 for 30 secs to 1 min 15 secs, rest breaks in between Gripper set at level 1 to pick up 1 inch blocks  with right and left UE's for sustained grip, min difficulty Pt reports amb to BR with walker without assist, therapist recommends supervision for safety.  02/25/23-Arm bike x 6 mins level 2 for conditioning, Pt transferred to the seat for the first time with supervision Reviewed previously issued red theraband exercises for UE strength 20 reps each, min v.c  Fine motor coordination exercises with left and right UE's, flipping playing cards and  stacking coins with min difficulty using RUE, picking up coins and placing in container with LUE and flipping cards mod difficulty. Pt attempted placing small pegs into a pegboard with bilateral UE's max difficulty with LUE, mod difficulty with RUE, task was discontinued. Pt reports being a little off. She took her meds without eating and got into a disagreement with transportation this a.m.    02/18/23- re-eval performed and therapist discussed potential goals with patient.  PATIENT EDUCATION: Education details:    progress towards goals and plans to renew Person educated: Patient Education method: explanation, demonstration Education comprehension: verbalized understanding, returned demonstration.  HOME EXERCISE PROGRAM:  12/17/22- coordination HEP, cane exercises seated   GOALS: Goals reviewed with patient? Yes  SHORT TERM GOALS: Target date: 06/12/23  I with initial HEP Baseline: Goal status: met 01/21/23  2.  Pt will demonstrate improved UE functional use as evidenced increasing bilateral box/ blocks score by 4 blocks.  Baseline: R 35, L 26 Goal status  met 12/31/22 RUE :43:  LUE 35    3.  Pt will donn pants with mod A. Baseline: max A Goal status:met  02/18/23- pt performed mod I today  4. Pt will verbalize understanding of adapted strategies to maximize safety and I with ADLs/ IADLs .  Goal status:  ongoing, has tub bench and long handled sponge  8/6  ongoing - patient relies on husband to lift legs into tub - encouraging patient to increase participation with IADL at home.    5.  Pt will demonstrate ability to stand for functional activity with min A x 10 mins in prep for ADLs.  Revised short term goal 05/13/23 -Pt will stand for 5 mins prior to rest break in prep for ADLS.  Goal status: ongoing pt stood for 1 min on 02/18/23 8/6:  Stood x 2 min in clinic - patient reporting increased fatigue due to IVIG treatment.  05/13/23-Stood 1 min 18 secs  6.  Pt will perform w/c to [facility]  transfers mod I-  mod I per pt report 05/08/23  Goal status:  met, 02/18/23 7. Pt will increase bilateral grip strength by 5 lbs for increased functional use of UE's  Baseline 05/13/23-RUE 42, LUE 34  Goal status: new     LONG TERM GOALS: Target date: 07/08/23 I with updated HEP  Goal status: ongoing, 05/13/23  2.  Pt will perform LB dressing mod I. Baseline:max A Goal status:met 03/25/23- 8/6- patient indicates she is dressing herself.   3.  Pt will bathe with supervision/ set up. Baseline: min-mod A for sponge bath Goal status: duplicate goal, therefore deferred,  see below, pt met for sponge bathing.  4.  Pt will perform basic cooking at a w/c level mod I  Revised goal: Pt will perform basic cooking modified independently at a walker level.  Goal status:  Ongoing, pt is currently performing at a w/c level 05/13/23, not walking in kitchen  5.  Pt will demonstrate improved fine motor coordination as evidenced by performing LUE 9 hole peg test in 2 mins or less. - initial goal met Revised goal:Pt will demonstrate improved fine motor coordination as evidenced by performing 9 hole peg test in 55 secs or less  8/6- 63 seconds- ongoing  Goal status: ongoing- 1 min 17 secs on 05/13/23  6.  Pt will demonstrate improved fine motor coordination as evidenced by performing RUE 9 hole peg test in 1 mins  30 secs or less.  Baseline: RUE  1 min 48 secs, LUE 4 pegs placed in 2 mins  Goal status: met 6/4/246/4/24- RUE 35.46,     7. Pt will perform shower with supervision using tub transfer bench Goal status: ongoing,  partially met pt has assist with lifting legs into tub, then she completes bathing at a supervision level, has assist with drying 05/13/23    8. Pt will perform basic home management at a walker level mod I    Goal status: ongoing, 05/13/23- still performing at w/c level, not consistent    CLINICAL IMPRESSION: Pt demonstrates good overall progress. She has achieved 4/6 short term  goals and 3/7 long term goals(1 goal deferred due to duplication). Pt progress has been limited by breaks in therapy for IVIG treatments and pt is more deconditioned following treatments. Pt continues to demonstrate the following deficits: decreased strength, decreased coordination, decreased balance, decreased activity tolerance, decreased functional mobility which impedes performance of ADLs/IADLs. Pt can benefit from continued skilled occupational therapy to address these deficits.  PERFORMANCE DEFICITS: in functional skills including ADLs, IADLs, coordination, dexterity, sensation, ROM, strength, flexibility, Fine motor control, Gross motor control, mobility, balance, endurance, decreased knowledge of precautions, decreased knowledge of  use of DME, and UE functional use, cognitive skills including  and psychosocial skills including coping strategies, environmental adaptation, habits, interpersonal interactions, and routines and behaviors.   IMPAIRMENTS: are limiting patient from ADLs, IADLs, rest and sleep, play, leisure, and social participation.   CO-MORBIDITIES: may have co-morbidities  that affects occupational performance. Patient will benefit from skilled OT to address above impairments and improve overall function.  MODIFICATION OR ASSISTANCE TO COMPLETE EVALUATION: No modification of tasks or assist necessary to complete an evaluation.  OT OCCUPATIONAL PROFILE AND HISTORY: Detailed assessment: Review of records and additional review of physical, cognitive, psychosocial history related to current functional performance.  CLINICAL DECISION MAKING: LOW - limited treatment options, no task modification necessary  REHAB POTENTIAL: Good  EVALUATION COMPLEXITY: Low    PLAN:  OT FREQUENCY: 2x/week  OT DURATION: 8 weeks  PLANNED INTERVENTIONS: self care/ADL training, therapeutic exercise, therapeutic activity, neuromuscular re-education, manual therapy, passive range of motion, gait  training, balance training, stair training, functional mobility training, aquatic therapy, ultrasound, paraffin, moist heat, cryotherapy, patient/family education, energy conservation, coping strategies training, DME and/or AE instructions, and Re-evaluation  RECOMMENDED OTHER SERVICES: PT  CONSULTED AND AGREED WITH PLAN OF CARE: Patient and family member/caregiver  PLAN FOR NEXT SESSION: continue to work towards updated goals  Taygen Newsome, OT 05/27/2023, 3:30 PM

## 2023-05-28 ENCOUNTER — Telehealth (INDEPENDENT_AMBULATORY_CARE_PROVIDER_SITE_OTHER): Payer: 59 | Admitting: Family Medicine

## 2023-05-28 ENCOUNTER — Encounter: Payer: Self-pay | Admitting: Family Medicine

## 2023-05-28 DIAGNOSIS — J40 Bronchitis, not specified as acute or chronic: Secondary | ICD-10-CM | POA: Diagnosis not present

## 2023-05-28 MED ORDER — DOXYCYCLINE HYCLATE 100 MG PO TABS: 100.0000 mg | ORAL_TABLET | Freq: Two times a day (BID) | ORAL | 0 refills | Status: DC

## 2023-05-28 MED ORDER — BENZONATATE 200 MG PO CAPS: 200.0000 mg | ORAL_CAPSULE | Freq: Four times a day (QID) | ORAL | 0 refills | Status: DC | PRN

## 2023-05-28 NOTE — Progress Notes (Signed)
Subjective:    Patient ID: Dana [name], female    DOB: 1966-02-14, 57 y.o.   MRN: [id number]  HPI Virtual Visit via Video Note  I connected with the patient on 05/28/23 at  1:00 PM EDT by a video enabled telemedicine application and verified that I am speaking with the correct person using two identifiers.  Location patient: home Location provider:work or home office Persons participating in the virtual visit: patient, provider  I discussed the limitations of evaluation and management by telemedicine and the availability of in person appointments. The patient expressed understanding and agreed to proceed.   HPI: Here for 7 days of chest congestion, coughing up yellow sputum, and fever. No SOB. Using Mucinex.    ROS: See pertinent positives and negatives per HPI.  Past Medical History:  Diagnosis Date   Anxiety    Back pain with radiation    Breast mass, right    Chronic pain    Chronic, continuous use of opioids    Complication of anesthesia 04/07/14   Allergic reaction to Lisinopril immediately following surgery   DJD (degenerative joint disease)    Fibromyalgia    Groin abscess    Headache(784.0)    History of IBS    Hypercholesterolemia    IBS (irritable bowel syndrome)    Lactose intolerance    Mild hypertension    Obesity    Tobacco use disorder    Umbilical hernia    Symptomatic    Past Surgical History:  Procedure Laterality Date   ABDOMINAL HYSTERECTOMY     ANTERIOR CERVICAL DECOMP/DISCECTOMY FUSION N/A 12/20/2015   Procedure: ANTERIOR CERVICAL DECOMPRESSION FUSION CERVICAL 4-5, CERVICAL 5-6, CERVICAL 6-7 WITH INSTRUMENTATION AND ALLOGRAFT;  Surgeon: Estill Bamberg, MD;  Location: [facility] OR;  Service: Orthopedics;  Laterality: N/A;  Anterior cervical decompression fusion, cervical 4-5, cervical 5-6, cervical 6-7 with instrumentation and allograft   BACK SURGERY     BILATERAL SALPINGECTOMY  09/03/2012   Procedure: BILATERAL SALPINGECTOMY;  Surgeon: Ok Edwards, MD;  Location: [facility];  Service: Gynecology;  Laterality: Bilateral;   BREAST BIOPSY Right 04/07/2014   Procedure: REMOVAL RIGHT BREAST MASS WITH WIRE LOCALIZATION;  Surgeon: Adolph Pollack, MD;  Location: [facility];  Service: General;  Laterality: Right;   BREAST EXCISIONAL BIOPSY Left    x2   BREAST LUMPECTOMY     x2   CARPAL TUNNEL RELEASE Left 05/11/2019   Procedure: LEFT CARPAL TUNNEL RELEASE, RIGHT TENNIS ELBOW MARCAINE/DEPO MEDROL INJECTION UNDER ANESTHESIA;  Surgeon: Kerrin Champagne, MD;  Location: [facility] OR;  Service: Orthopedics;  Laterality: Left;   COLONOSCOPY W/ BIOPSIES  04/24/2012   per Dr. Leone Payor, clear, repeat in 10 yrs    disectomy     ESOPHAGOGASTRODUODENOSCOPY     FINGER SURGERY     Right index-excision of mass    FOOT SURGERY Right    Bone Spurs   IR FLUORO GUIDE CV LINE RIGHT  01/29/2023   IR REMOVAL TUN CV CATH W/O FL  02/08/2023   IR US GUIDE VASC ACCESS RIGHT  01/31/2023   LAPAROSCOPIC HYSTERECTOMY  09/03/2012   Procedure: HYSTERECTOMY TOTAL LAPAROSCOPIC;  Surgeon: Ok Edwards, MD;  Location: [facility];  Service: Gynecology;  Laterality: N/A;   LUMBAR DISC SURGERY     TUBAL LIGATION     UMBILICAL HERNIA REPAIR N/A 05/01/2018   Procedure: UMBILICAL HERNIA REPAIR;  Surgeon: Jimmye Norman, MD;  Location: [facility] OR;  Service: General;  Laterality: N/A;  Family History  Problem Relation Age of Onset   Diabetes Mother    Diabetes Father    Breast cancer Maternal Aunt 46   Prostate cancer Paternal Grandfather      Current Outpatient Medications:    ALPRAZolam (XANAX) 0.5 MG tablet, Take 1 tablet (0.5 mg total) by mouth 2 (two) times daily as needed for anxiety., Disp: 180 tablet, Rfl: 1   Cyanocobalamin (VITAMIN B-12 PO), Take 1 tablet by mouth daily., Disp: , Rfl:    cyclobenzaprine (FLEXERIL) 10 MG tablet, Take 1 tablet (10 mg total) by mouth 3 (three) times daily. (Patient taking differently: Take 10 mg by mouth 3 (three) times daily as needed for muscle  spasms.), Disp: 90 tablet, Rfl: 5   dorzolamide-timolol (COSOPT) 2-0.5 % ophthalmic solution, Place 1 drop into both eyes 2 (two) times daily., Disp: 10 mL, Rfl: 1   famotidine (PEPCID) 20 MG tablet, Take 20 mg by mouth 2 (two) times daily., Disp: , Rfl:    fluticasone (FLONASE) 50 MCG/ACT nasal spray, SPRAY 2 SPRAYS INTO EACH NOSTRIL EVERY DAY, Disp: 16 mL, Rfl: 11   folic acid (FOLVITE) 1 MG tablet, Take 1 tablet (1 mg total) by mouth daily., Disp: 90 tablet, Rfl: 3   furosemide (LASIX) 40 MG tablet, Take 1 tablet (40 mg total) by mouth 2 (two) times daily., Disp: 180 tablet, Rfl: 3   gabapentin (NEURONTIN) 300 MG capsule, Take 3 capsules (900 mg total) by mouth 3 (three) times daily., Disp: 270 capsule, Rfl: 5   hydrocerin (EUCERIN) CREA, Apply 1 Application topically 2 (two) times daily., Disp: , Rfl: 0   ibuprofen (ADVIL) 200 MG tablet, Take 400 mg by mouth 2 (two) times daily as needed for headache or moderate pain., Disp: , Rfl:    latanoprost (XALATAN) 0.005 % ophthalmic solution, Place 1 drop into both eyes at bedtime., Disp: 2.5 mL, Rfl: 1   magnesium oxide (MAG-OX) 400 MG tablet, Take 1 tablet (400 mg total) by mouth at bedtime. (Patient taking differently: Take 200 mg by mouth at bedtime.), Disp: 90 tablet, Rfl: 3   metoprolol tartrate (LOPRESSOR) 50 MG tablet, Take 1 tablet (50 mg total) by mouth 2 (two) times daily., Disp: 180 tablet, Rfl: 3   morphine (MS CONTIN) 60 MG 12 hr tablet, Take 1 tablet (60 mg total) by mouth every 12 (twelve) hours., Disp: 60 tablet, Rfl: 0   Multiple Vitamin (MULTIVITAMIN WITH MINERALS) TABS tablet, Take 1 tablet by mouth daily., Disp: 30 tablet, Rfl: 0   Oxycodone HCl 20 MG TABS, Take 1 tablet (20 mg total) by mouth every 6 (six) hours as needed (pain)., Disp: 120 tablet, Rfl: 0   pantoprazole (PROTONIX) 40 MG tablet, Take 1 tablet (40 mg total) by mouth daily., Disp: 90 tablet, Rfl: 3   potassium chloride (KLOR-CON) 10 MEQ tablet, Take 10 mEq by mouth 2  (two) times daily., Disp: , Rfl:    promethazine (PHENERGAN) 25 MG tablet, Take 25 mg by mouth every 6 (six) hours as needed for nausea or vomiting., Disp: , Rfl:    senna-docusate (SENOKOT-S) 8.6-50 MG tablet, Take 1 tablet by mouth 2 (two) times daily. (Patient taking differently: Take 1 tablet by mouth 2 (two) times daily as needed for moderate constipation.), Disp: , Rfl:    traZODone (DESYREL) 50 MG tablet, TAKE 1 TO 2 TABLETS BY MOUTH AT BEDTIME AS NEEDED FOR SLEEP, Disp: 180 tablet, Rfl: 1  EXAM:  VITALS per patient if applicable:  GENERAL: alert, oriented,  appears well and in no acute distress  HEENT: atraumatic, conjunttiva clear, no obvious abnormalities on inspection of external nose and ears  NECK: normal movements of the head and neck  LUNGS: on inspection no signs of respiratory distress, breathing rate appears normal, no obvious gross SOB, gasping or wheezing  CV: no obvious cyanosis  MS: moves all visible extremities without noticeable abnormality  PSYCH/NEURO: pleasant and cooperative, no obvious depression or anxiety, speech and thought processing grossly intact  ASSESSMENT AND PLAN: Bronchitis, treat with 10 days of Doxycycline.  Gershon Crane, MD  Discussed the following assessment and plan:  No diagnosis found.     I discussed the assessment and treatment plan with the patient. The patient was provided an opportunity to ask questions and all were answered. The patient agreed with the plan and demonstrated an understanding of the instructions.   The patient was advised to call back or seek an in-person evaluation if the symptoms worsen or if the condition fails to improve as anticipated.      Review of Systems     Objective:   Physical Exam        Assessment & Plan:

## 2023-05-29 ENCOUNTER — Ambulatory Visit: Payer: 59 | Admitting: Occupational Therapy

## 2023-05-29 ENCOUNTER — Ambulatory Visit: Payer: 59 | Admitting: Physical Therapy

## 2023-06-05 ENCOUNTER — Ambulatory Visit: Payer: 59 | Admitting: Physical Therapy

## 2023-06-05 ENCOUNTER — Ambulatory Visit: Payer: 59 | Attending: Family Medicine | Admitting: Occupational Therapy

## 2023-06-05 ENCOUNTER — Encounter: Payer: Self-pay | Admitting: Physical Therapy

## 2023-06-05 DIAGNOSIS — R278 Other lack of coordination: Secondary | ICD-10-CM | POA: Insufficient documentation

## 2023-06-05 DIAGNOSIS — M6281 Muscle weakness (generalized): Secondary | ICD-10-CM | POA: Insufficient documentation

## 2023-06-05 DIAGNOSIS — R2681 Unsteadiness on feet: Secondary | ICD-10-CM | POA: Insufficient documentation

## 2023-06-05 DIAGNOSIS — R2689 Other abnormalities of gait and mobility: Secondary | ICD-10-CM

## 2023-06-05 NOTE — Therapy (Signed)
OUTPATIENT OCCUPATIONAL THERAPY NEURO TREATMENT   Patient Name: Dana [name] MRN: [id number] DOB:September 21, 1965, 57 y.o., female Today's Date: 06/05/2023  PCP: Dr. Clent Ridges REFERRING PROVIDER: Dr. Gershon Crane  END OF SESSION:  OT End of Session - 06/05/23 1234     Visit Number 24    Date for OT Re-Evaluation 07/08/23    Authorization Type UHC, Medicare    Progress Note Due on Visit 33    OT Start Time 1233    OT Stop Time 1313    OT Time Calculation (min) 40 min    Activity Tolerance Patient tolerated treatment well    Behavior During Therapy [facility] for tasks assessed/performed                       Past Medical History:  Diagnosis Date   Anxiety    Back pain with radiation    Breast mass, right    Chronic pain    Chronic, continuous use of opioids    Complication of anesthesia 04/07/14   Allergic reaction to Lisinopril immediately following surgery   DJD (degenerative joint disease)    Fibromyalgia    Groin abscess    Headache(784.0)    History of IBS    Hypercholesterolemia    IBS (irritable bowel syndrome)    Lactose intolerance    Mild hypertension    Obesity    Tobacco use disorder    Umbilical hernia    Symptomatic   Past Surgical History:  Procedure Laterality Date   ABDOMINAL HYSTERECTOMY     ANTERIOR CERVICAL DECOMP/DISCECTOMY FUSION N/A 12/20/2015   Procedure: ANTERIOR CERVICAL DECOMPRESSION FUSION CERVICAL 4-5, CERVICAL 5-6, CERVICAL 6-7 WITH INSTRUMENTATION AND ALLOGRAFT;  Surgeon: Estill Bamberg, MD;  Location: [facility] OR;  Service: Orthopedics;  Laterality: N/A;  Anterior cervical decompression fusion, cervical 4-5, cervical 5-6, cervical 6-7 with instrumentation and allograft   BACK SURGERY     BILATERAL SALPINGECTOMY  09/03/2012   Procedure: BILATERAL SALPINGECTOMY;  Surgeon: Ok Edwards, MD;  Location: [facility] OR[facility];  Service: Gynecology;  Laterality: Bilateral;   BREAST BIOPSY Right 04/07/2014   Procedure: REMOVAL RIGHT BREAST MASS WITH WIRE  LOCALIZATION;  Surgeon: Adolph Pollack, MD;  Location: [facility] OR;  Service: General;  Laterality: Right;   BREAST EXCISIONAL BIOPSY Left    x2   BREAST LUMPECTOMY     x2   CARPAL TUNNEL RELEASE Left 05/11/2019   Procedure: LEFT CARPAL TUNNEL RELEASE, RIGHT TENNIS ELBOW MARCAINE/DEPO MEDROL INJECTION UNDER ANESTHESIA;  Surgeon: Kerrin Champagne, MD;  Location: [facility] OR;  Service: Orthopedics;  Laterality: Left;   COLONOSCOPY W/ BIOPSIES  04/24/2012   per Dr. Leone Payor, clear, repeat in 10 yrs    disectomy     ESOPHAGOGASTRODUODENOSCOPY     FINGER SURGERY     Right index-excision of mass    FOOT SURGERY Right    Bone Spurs   IR FLUORO GUIDE CV LINE RIGHT  01/29/2023   IR REMOVAL TUN CV CATH W/O FL  02/08/2023   IR US GUIDE VASC ACCESS RIGHT  01/31/2023   LAPAROSCOPIC HYSTERECTOMY  09/03/2012   Procedure: HYSTERECTOMY TOTAL LAPAROSCOPIC;  Surgeon: Ok Edwards, MD;  Location: [facility];  Service: Gynecology;  Laterality: N/A;   LUMBAR DISC SURGERY     TUBAL LIGATION     UMBILICAL HERNIA REPAIR N/A 05/01/2018   Procedure: UMBILICAL HERNIA REPAIR;  Surgeon: Jimmye Norman, MD;  Location: [facility] OR;  Service: General;  Laterality: N/A;   Patient  Active Problem List   Diagnosis Date Noted   CIDP (chronic inflammatory demyelinating polyneuropathy) (HCC) 01/28/2023   Gait abnormality 01/28/2023   Glaucoma 01/28/2023   Pain due to onychomycosis of toenails of both feet 01/21/2023   Wheelchair dependence 12/30/2022   Nerve pain 12/30/2022   Insomnia due to medical condition 12/30/2022   Acute inflammatory demyelinating polyneuropathy (HCC) 10/01/2022   UTI (urinary tract infection) 09/24/2022   Hypomagnesemia 09/24/2022   Weakness 09/23/2022   Hypokalemia 09/23/2022   B12 deficiency 09/05/2022   Folate deficiency 09/05/2022   Carpal tunnel syndrome, left upper limb 05/11/2019    Class: Chronic   Spinal stenosis of lumbar region with neurogenic claudication 08/20/2018   Congenital deformity of finger  09/12/2017   Primary osteoarthritis of right knee 02/27/2017   Right tennis elbow 11/11/2016   Muscle cramps 01/15/2016   Bilateral leg edema 01/08/2016   Radiculopathy 12/20/2015   Hyperglycemia 12/21/2014   Mastodynia, female 11/30/2014   S/P excision of fibroadenoma of breast 11/30/2014   Angioedema of lips 04/07/2014   Fibroadenoma of right breast 03/07/2014   Pelvic pain in female 06/19/2012   Menorrhagia 06/11/2012   SUI (stress urinary incontinence, female) 06/11/2012   HTN (hypertension) 06/11/2012   IBS (irritable bowel syndrome) - diarrhea predominant 03/17/2012   MICROSCOPIC HEMATURIA 11/30/2010   BREAST PAIN, RIGHT 11/30/2010   BREAST MASS, RIGHT 01/12/2010   HYPERCHOLESTEROLEMIA 12/07/2007   CIGARETTE SMOKER 12/07/2007   DEGENERATIVE JOINT DISEASE 12/07/2007   Low back pain with sciatica 12/07/2007   HEADACHE 12/07/2007   Obesity, Class III, BMI 40-49.9 (morbid obesity) (HCC) 08/03/2007   Anxiety state 08/03/2007    ONSET DATE: 12/05/22- referral date  REFERRING DIAG: G61.0 (ICD-10-CM) - Guillain Barr syndrome (HCC)  THERAPY DIAG:    Rationale for Evaluation and Treatment: Rehabilitation  SUBJECTIVE:   SUBJECTIVE STATEMENT: Pt reports she had  a reaction to her IVIG PERTINENT HISTORY: Pt is a 57 y/o female hospitalized 09/23/22  with progressive weakness and sensation changes, consistent with Guillain-Barr syndrome. MRI negative. LP results and assessment most consistent with Guillain-Barr syndrome.  Pt was d/c home from CIR 11/06/22 PMH includes: glaucoma anxiety, DJD, fibromyalgia, HTN, tobacco use, B-12 deficiency, OA, ACDF 2017.  Pt was re-hospitalized 01/28/2456 with CIDP (chronic inflammatory demyelinating polyneuropathy). Saw Neuro on day of admission, NCS showed demyelination, referred to hospital for PLEX. Initially diagnosed with an acute demyelinating neuropathy Jan 2024, treated with IVIG. PMH: Spinal stenosis, HTN, Obesity   PRECAUTIONS:  Fall  WEIGHT BEARING RESTRICTIONS: no PAIN: no  A      FALLS: Has patient fallen in last 6 months? No  LIVING ENVIRONMENT: Lives with: lives with their spouse Lives in: House/apartment Stairs: has ramp built Has following equipment at home:   BSC, steadi,  tub bench but can't access bathroom   PLOF: Independent  PATIENT GOALS: increase independence   OBJECTIVE:   HAND DOMINANCE: Right  ADLs: from initial eval Overall ADLs: assistance required, unable to access BR Transfers/ambulation related to ADLs: Eating: unable to cut food Grooming: set up UB Dressing: set up for bra and shirt LB Dressing: max A Toileting: uses BSC uses steadi Bathing: min-mod A in hospital  bed  Tub Shower transfers: n/a Equipment: Emergency planning/management officer  IADLs:dependent for IADLS   MOBILITY STATUS:  uses w/c  , per PT report minguard for transfer from w/c to mat    ACTIVITY TOLERANCE: Activity tolerance: Pt fatigues quickly    UPPER EXTREMITY ROM:  see updates for  02/18/23 re-eval   Active ROM Right 02/18/23 Left 02/18/23  Shoulder flexion 130 120  Shoulder abduction 90 90  Shoulder adduction    Shoulder extension    Shoulder internal rotation    Shoulder external rotation    Elbow flexion    Elbow extension  -5  Wrist flexion    Wrist extension    Wrist ulnar deviation    Wrist radial deviation    Wrist pronation    Wrist supination    (Blank rows = not tested)  UPPER EXTREMITY MMT:   see below for re-eval 02/18/23  MMT Right 02/18/23 Left 02/18/23  Shoulder flexion 3+/5 3+/5  Shoulder abduction    Shoulder adduction    Shoulder extension    Shoulder internal rotation    Shoulder external rotation    Middle trapezius    Lower trapezius    Elbow flexion 4/5 4/5  Elbow extension 4-/5 4-/5  Wrist flexion    Wrist extension    Wrist ulnar deviation    Wrist radial deviation    Wrist pronation    Wrist supination      HAND FUNCTION: Grip strength: Right: 32 lbs; Left:  18 lbs, 02/18/23 R 55 lbs, L 35 lbs 05/13/23- grip RUE 42, LUE 34  COORDINATION: 9 Hole Peg test:initial eval  Right: 1 min 48 sec; Left: 4 pegs in 2 mins  Box and Blocks:  Right 35 blocks, Left  26 blocks 02/18/23- RUE 35.46, LUE 65 secs  SENSATION: Light touch: WFL Hot/Cold: Not tested    COGNITION: Overall cognitive status:NT   VISION: Subjective report: reports hx of glaucoma VISION ASSESSMENT: Not tested  OBSERVATIONS: Pt is eager to improve, pt's husband is very supportive   TODAY'S TREATMENT:                                                                                                                              DATE:06/05/23-Pt reports being sick then having her IVIG treatment  Pt performed fine motor coordination activity to to copy small peg design with LUE, min difficulty and increased time required, min v.c for design. Standing to place and remove graded clothespins 1-8# from vertical antennae with left and right UE , pt required 1 rest break due to fatigue. Red theraband exercises for increased strength and endurance for the following :abduction , biceps and triceps, 15 reps each  05/13/23 Pt ambulated to and from arm bike with cane and min a grossly 3 ft.  Arm bike x 6 mins level 3 for conditioning Therapist checked progress towards goals and discussed pt performance towards goals. Pt would like to continue therapy. Pt stood for 1 min 18 secs for functional reaching prior to rest break. Pt stood a second time for grossly 1 min prior to rest break. Frequent rest breaks are required due to pt's recent IVIG, and pt states she gets fatigued more quickly.   05/08/23- Pt ambulated grossly 3 ft with  cane with minguard to and from to arm bike. Arm bike x 6 mins level 1 for conditioning Pt copied small peg design with left and right UE's for increased fine motor coordination, mod difficulty for LUE and drops, occasional min difficulty with RUE. Pt's LUE continues to fatigue  quickly. Standing to perform functional reaching to retrieve items from chair to place on elevated surface with close supervision for standing balance and tolerance. Rest break required then pt was able to return items to lower surface with RUE with supervision. Pt continues to fatigue quickly and she stood for less than a minute.    05/01/23- Arm bike x 6 mins level 2 for conditioning, pt walked to and from arm bike with walker and supervision. Placing and removing grooved pegs from pegboard with LUE for increased fine motor coordination, removing with in hand manipulation, min difficulty/ drops and v.c Placing graded clothespins 1-8# on vertical antennae with LUE then removing with RUE for sustained pinch, min v.c  Gripper set at level 2, pt picked up blocks with LUE for sustained grip, min difficulty/ drops   04/22/23:  This progress update covers dates of service from 6/4-04/22/23.   Started session working on LUE coordination.  Copying small peg patterns.  Patient with frequent drops, difficulty orienting small pegs in hand.  Patient with report of diminished sensation in left hand.  Attempted same task with left hand in glove with slightly increased ability initially - however - not able to sustain.   Worked on hand strengthening with gripper level 2 x 20 blocks.  Faciliatation an dcueing to reduce trunk lateral flexion and shoulder elevation.  Attempted 5 blocks at level 3 - fatigued after 3 blocks.   Standing balance/ tolerance at counter - working toward reducing UE reliance - reaching into chest high shelf, reaching to shelf at eye level.  Stand with walker and taking slow cautious steps to counter.    04/08/23-Pt ambulated approximately 5 feet to walk to arm bike with rolling walker with close supervision. Arm bike x 6 mins level 1 for conditioning Min a for sit to stand from armbike then pt ambulated back to w/c. Seated in w/c closed chain shoulder flexion, with unweighted ball followed by  shoulder flexion then trunk rotation and chest press wih 2.2 lbs weighted ball for UE strength and endurance, rest between sets. Fine motor coordination task to copy small peg design with LUE, min-mod difficulty, v.c to avoid shoulder hike/ compensation.  04/01/23- Seated closed chain shoulder flexion, with unweighted ball followed by shoulder flexion then trunk rotation and chest press wih 2.2 lbs weighted ball. Rest in between sets seated on mat. Pt walker to mat with walker and supervision. Standing at countertop functional reaching to place graded clothespins in targets with RUE, rest break then pt removed with left hand, close supervision. Discussed plans to continue OT after next week. Pt walked mat to w/c with walker and close supervision .   03/27/23-Theraband exercises for biceps curls, triceps, rowing  and shoulder extension with green band and shoulder abduction with red band 10-15 reps each, min v.c, rest break in between sets Standing at countertop to place and remove items from lowest overhead shelf, seated rest break prior to replacing, close supervision for moblity with walker  Gripper set at level 2 sustained grip to pick up 1 inch blocks, 1/2 with RUE with min difficulty and 1/2 with left with min-mod difficulty for sustained grip Graded clothespins for sustained pinch 1-8# using left  and right UE's for sustained pinch, RUE only with black clothespins dur to difficulty. Therapist recommends that pt orders a long handled sponge to apply lotion onto legs. Pt with continued bilateral LE swelling.  03/25/23 Pt is hoarse today and reports a tickling in her throat as well as LE swelling. Therapist recommends pt calls MD.Pt arrived late as her transportation did not pick her up. Arm bike x 6 mins level 1 for conditioning.Pt transferred to and from arm bike to w/c with min A. Copying small peg design for increased fine motor coordination with LUE, min difficulty and increased time, removing with  in hand manipulation. Pt demonstrates improved fine motor coordination.  03/18/23- Pt amb across gym with RW and minguard to arm bike.  Arm bike x 7 mins level 1  for conditioning Placing small pegs into pegboard with LUE for increased fine motor coordination, pt with improved performance and coordination today. Pt walked 3 ft from chair to counter and stood to place and remove items from overhead shelves with 1 seated rest break. Discussion with patient regarding her home situation,. Pt reports frustration and she feels like she is a burden on husband. Therapist recommends pt seeks counseling. Shoulder abduction  with red band biceps curls with green band  20 reps for UE strengthening   03/13/23-Pt reports she had a stomach bug and she wasn't feeling well. Placing large pegs into pegboard with LUE with occasional min difficulty, then removing with 3 pt pinch min v.c to avoid shoulder hike. Red theraband exercises for shoulder abduction, rowing and extension 15 reps each,  green band for biceps curls and triceps extension 15 reps each, min v.c Arm bike x 6 mins level 3 for conditioning, pt transfered to and from with min A Discussed safety for cooking and recommendation that if pt decides to fry fish, she performs with someone for safety, and she keeps her LUE away from hot surfaces due to sensory deficits. Pt verbalized understanding.   03/04/23 Red theraband exercises for shoulder abduction, rowing and extension 20 reps each, upgraded to green band for biceps curls and triceps extension 20 reps each, min v.c Arm bike x 8 mins, 4 mins each direction, level 2 for conditioning, pt transferred to and from seat with supervision. Coordination activities with LUE: flipping and dealing cards, min difficulty/ v.c  02/27/23-Arm bike x 6 mins level 2 for conditioning (Pt transfers to and from w/c with supervision) Placing large pegs into pegboard with LUE then removing for increased fine motor coordination,  min difficulty/ v.c  Standing at countertop with supervision to place and remove items with right then left UE's, pt stood x 3 for 30 secs to 1 min 15 secs, rest breaks in between Gripper set at level 1 to pick up 1 inch blocks  with right and left UE's for sustained grip, min difficulty Pt reports amb to BR with walker without assist, therapist recommends supervision for safety.  02/25/23-Arm bike x 6 mins level 2 for conditioning, Pt transferred to the seat for the first time with supervision Reviewed previously issued red theraband exercises for UE strength 20 reps each, min v.c  Fine motor coordination exercises with left and right UE's, flipping playing cards and stacking coins with min difficulty using RUE, picking up coins and placing in container with LUE and flipping cards mod difficulty. Pt attempted placing small pegs into a pegboard with bilateral UE's max difficulty with LUE, mod difficulty with RUE, task was discontinued. Pt reports being a  little off. She took her meds without eating and got into a disagreement with transportation this a.m.    02/18/23- re-eval performed and therapist discussed potential goals with patient.  PATIENT EDUCATION: Education details:    progress towards goals and plans to renew Person educated: Patient Education method: explanation, demonstration Education comprehension: verbalized understanding, returned demonstration.  HOME EXERCISE PROGRAM:  12/17/22- coordination HEP, cane exercises seated   GOALS: Goals reviewed with patient? Yes  SHORT TERM GOALS: Target date: 06/12/23  I with initial HEP Baseline: Goal status: met 01/21/23  2.  Pt will demonstrate improved UE functional use as evidenced increasing bilateral box/ blocks score by 4 blocks.  Baseline: R 35, L 26 Goal status  met 12/31/22 RUE :43:  LUE 35    3.  Pt will donn pants with mod A. Baseline: max A Goal status:met  02/18/23- pt performed mod I today  4. Pt will verbalize  understanding of adapted strategies to maximize safety and I with ADLs/ IADLs .  Goal status:  ongoing, has tub bench and long handled sponge  8/6  ongoing - patient relies on husband to lift legs into tub - encouraging patient to increase participation with IADL at home.    5.  Pt will demonstrate ability to stand for functional activity with min A x 10 mins in prep for ADLs.  Revised short term goal 05/13/23 -Pt will stand for 5 mins prior to rest break in prep for ADLS.  Goal status: ongoing pt stood for 1 min on 02/18/23 8/6:  Stood x 2 min in clinic - patient reporting increased fatigue due to IVIG treatment.  05/13/23-Stood 1 min 18 secs  6.  Pt will perform w/c to [facility] transfers mod I-  mod I per pt report 05/08/23  Goal status:  met, 02/18/23 7. Pt will increase bilateral grip strength by 5 lbs for increased functional use of UE's  Baseline 05/13/23-RUE 42, LUE 34  Goal status: new     LONG TERM GOALS: Target date: 07/08/23 I with updated HEP  Goal status: ongoing, 05/13/23  2.  Pt will perform LB dressing mod I. Baseline:max A Goal status:met 03/25/23- 8/6- patient indicates she is dressing herself.   3.  Pt will bathe with supervision/ set up. Baseline: min-mod A for sponge bath Goal status: duplicate goal, therefore deferred,  see below, pt met for sponge bathing.  4.  Pt will perform basic cooking at a w/c level mod I  Revised goal: Pt will perform basic cooking modified independently at a walker level.  Goal status:  Ongoing, pt is currently performing at a w/c level 05/13/23, not walking in kitchen  5.  Pt will demonstrate improved fine motor coordination as evidenced by performing LUE 9 hole peg test in 2 mins or less. - initial goal met Revised goal:Pt will demonstrate improved fine motor coordination as evidenced by performing 9 hole peg test in 55 secs or less  8/6- 63 seconds- ongoing  Goal status: ongoing- 1 min 17 secs on 05/13/23  6.  Pt will demonstrate improved  fine motor coordination as evidenced by performing RUE 9 hole peg test in 1 mins  30 secs or less.  Baseline: RUE  1 min 48 secs, LUE 4 pegs placed in 2 mins  Goal status: met 6/4/246/4/24- RUE 35.46,     7. Pt will perform shower with supervision using tub transfer bench Goal status: ongoing,  partially met pt has assist with lifting legs into tub, then  she completes bathing at a supervision level, has assist with drying 05/13/23    8. Pt will perform basic home management at a walker level mod I    Goal status: ongoing, 05/13/23- still performing at w/c level, not consistent    CLINICAL IMPRESSION: Pt has not been seen for several weeks due to pt ut of town and illness. Pt returns to OT to increase her strength and endurance for ADLS. PT reports being deconditioned.  PERFORMANCE DEFICITS: in functional skills including ADLs, IADLs, coordination, dexterity, sensation, ROM, strength, flexibility, Fine motor control, Gross motor control, mobility, balance, endurance, decreased knowledge of precautions, decreased knowledge of use of DME, and UE functional use, cognitive skills including  and psychosocial skills including coping strategies, environmental adaptation, habits, interpersonal interactions, and routines and behaviors.   IMPAIRMENTS: are limiting patient from ADLs, IADLs, rest and sleep, play, leisure, and social participation.   CO-MORBIDITIES: may have co-morbidities  that affects occupational performance. Patient will benefit from skilled OT to address above impairments and improve overall function.  MODIFICATION OR ASSISTANCE TO COMPLETE EVALUATION: No modification of tasks or assist necessary to complete an evaluation.  OT OCCUPATIONAL PROFILE AND HISTORY: Detailed assessment: Review of records and additional review of physical, cognitive, psychosocial history related to current functional performance.  CLINICAL DECISION MAKING: LOW - limited treatment options, no task modification  necessary  REHAB POTENTIAL: Good  EVALUATION COMPLEXITY: Low    PLAN:  OT FREQUENCY: 2x/week  OT DURATION: 8 weeks  PLANNED INTERVENTIONS: self care/ADL training, therapeutic exercise, therapeutic activity, neuromuscular re-education, manual therapy, passive range of motion, gait training, balance training, stair training, functional mobility training, aquatic therapy, ultrasound, paraffin, moist heat, cryotherapy, patient/family education, energy conservation, coping strategies training, DME and/or AE instructions, and Re-evaluation  RECOMMENDED OTHER SERVICES: PT  CONSULTED AND AGREED WITH PLAN OF CARE: Patient and family member/caregiver  PLAN FOR NEXT SESSION: continue to work towards updated goals, coordination, standing tolerance for ADLs.  Jennfer Gassen, OT 06/05/2023, 12:36 PM

## 2023-06-05 NOTE — Therapy (Signed)
OUTPATIENT PHYSICAL THERAPY LOWER EXTREMITY TREATMENT   Patient Name: Dana [name] MRN: [id number] DOB:Jul 12, 1966, 57 y.o., female Today's Date: 06/05/2023  END OF SESSION:  PT End of Session - 06/05/23 1314     Visit Number 23    Date for PT Re-Evaluation 07/03/23    PT Start Time 1315    PT Stop Time 1355    PT Time Calculation (min) 40 min    Equipment Utilized During Treatment Gait belt    Activity Tolerance Patient tolerated treatment well    Behavior During Therapy WFL for tasks assessed/performed            Past Medical History:  Diagnosis Date   Anxiety    Back pain with radiation    Breast mass, right    Chronic pain    Chronic, continuous use of opioids    Complication of anesthesia 04/07/14   Allergic reaction to Lisinopril immediately following surgery   DJD (degenerative joint disease)    Fibromyalgia    Groin abscess    Headache(784.0)    History of IBS    Hypercholesterolemia    IBS (irritable bowel syndrome)    Lactose intolerance    Mild hypertension    Obesity    Tobacco use disorder    Umbilical hernia    Symptomatic   Past Surgical History:  Procedure Laterality Date   ABDOMINAL HYSTERECTOMY     ANTERIOR CERVICAL DECOMP/DISCECTOMY FUSION N/A 12/20/2015   Procedure: ANTERIOR CERVICAL DECOMPRESSION FUSION CERVICAL 4-5, CERVICAL 5-6, CERVICAL 6-7 WITH INSTRUMENTATION AND ALLOGRAFT;  Surgeon: Estill Bamberg, MD;  Location: [facility] OR;  Service: Orthopedics;  Laterality: N/A;  Anterior cervical decompression fusion, cervical 4-5, cervical 5-6, cervical 6-7 with instrumentation and allograft   BACK SURGERY     BILATERAL SALPINGECTOMY  09/03/2012   Procedure: BILATERAL SALPINGECTOMY;  Surgeon: Ok Edwards, MD;  Location: [facility] ORS;  Service: Gynecology;  Laterality: Bilateral;   BREAST BIOPSY Right 04/07/2014   Procedure: REMOVAL RIGHT BREAST MASS WITH WIRE LOCALIZATION;  Surgeon: Adolph Pollack, MD;  Location: [facility] OR;  Service: General;  Laterality:  Right;   BREAST EXCISIONAL BIOPSY Left    x2   BREAST LUMPECTOMY     x2   CARPAL TUNNEL RELEASE Left 05/11/2019   Procedure: LEFT CARPAL TUNNEL RELEASE, RIGHT TENNIS ELBOW MARCAINE/DEPO MEDROL INJECTION UNDER ANESTHESIA;  Surgeon: Kerrin Champagne, MD;  Location: [facility] OR;  Service: Orthopedics;  Laterality: Left;   COLONOSCOPY W/ BIOPSIES  04/24/2012   per Dr. Leone Payor, clear, repeat in 10 yrs    disectomy     ESOPHAGOGASTRODUODENOSCOPY     FINGER SURGERY     Right index-excision of mass    FOOT SURGERY Right    Bone Spurs   IR FLUORO GUIDE CV LINE RIGHT  01/29/2023   IR REMOVAL TUN CV CATH W/O FL  02/08/2023   IR US GUIDE VASC ACCESS RIGHT  01/31/2023   LAPAROSCOPIC HYSTERECTOMY  09/03/2012   Procedure: HYSTERECTOMY TOTAL LAPAROSCOPIC;  Surgeon: Ok Edwards, MD;  Location: [facility];  Service: Gynecology;  Laterality: N/A;   LUMBAR DISC SURGERY     TUBAL LIGATION     UMBILICAL HERNIA REPAIR N/A 05/01/2018   Procedure: UMBILICAL HERNIA REPAIR;  Surgeon: Jimmye Norman, MD;  Location: [facility] OR;  Service: General;  Laterality: N/A;   Patient Active Problem List   Diagnosis Date Noted   CIDP (chronic inflammatory demyelinating polyneuropathy) (HCC) 01/28/2023   Gait abnormality 01/28/2023   Glaucoma 01/28/2023  Pain due to onychomycosis of toenails of both feet 01/21/2023   Wheelchair dependence 12/30/2022   Nerve pain 12/30/2022   Insomnia due to medical condition 12/30/2022   Acute inflammatory demyelinating polyneuropathy (HCC) 10/01/2022   UTI (urinary tract infection) 09/24/2022   Hypomagnesemia 09/24/2022   Weakness 09/23/2022   Hypokalemia 09/23/2022   B12 deficiency 09/05/2022   Folate deficiency 09/05/2022   Carpal tunnel syndrome, left upper limb 05/11/2019    Class: Chronic   Spinal stenosis of lumbar region with neurogenic claudication 08/20/2018   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   Primary osteoarthritis of right knee 02/27/2017   Right tennis elbow 11/11/2016    Muscle cramps 01/15/2016   Bilateral leg edema 01/08/2016   Radiculopathy 12/20/2015   Hyperglycemia 12/21/2014   Mastodynia, female 11/30/2014   S/P excision of fibroadenoma of breast 11/30/2014   Angioedema of lips 04/07/2014   Fibroadenoma of right breast 03/07/2014   Pelvic pain in female 06/19/2012   Menorrhagia 06/11/2012   SUI (stress urinary incontinence, female) 06/11/2012   HTN (hypertension) 06/11/2012   IBS (irritable bowel syndrome) - diarrhea predominant 03/17/2012   MICROSCOPIC HEMATURIA 11/30/2010   BREAST PAIN, RIGHT 11/30/2010   BREAST MASS, RIGHT 01/12/2010   HYPERCHOLESTEROLEMIA 12/07/2007   CIGARETTE SMOKER 12/07/2007   DEGENERATIVE JOINT DISEASE 12/07/2007   Low back pain with sciatica 12/07/2007   HEADACHE 12/07/2007   Obesity, Class III, BMI 40-49.9 (morbid obesity) (HCC) 08/03/2007   Anxiety state 08/03/2007    PCP: Nelwyn Salisbury, MD  REFERRING PROVIDER: Nelwyn Salisbury, MD  REFERRING DIAG: G61.0 (ICD-10-CM) - Guillain Barr syndrome [facility])   THERAPY DIAG:  Muscle weakness (generalized)  Other lack of coordination  Unsteadiness on feet  Other abnormalities of gait and mobility  Rationale for Evaluation and Treatment: Rehabilitation  ONSET DATE: 12/05/22  SUBJECTIVE:   SUBJECTIVE STATEMENT: Patient has been sick. She is recovered from that, but she then received a treatment on Mon/Tues. On Wed she had pain everywhere in her body. She feels better today.  OBJECTIVE STATEMENT: 01/28/23 ASSESSMENT AND PLAN   Dana [name] is a 57 y.o. female   Demyelinating polyradiculoneuropathy             Symptom onset monophasic since September 2024, failed to respond to IVIG in January 2024,             EMG nerve conduction study showed demyelinating features, no evidence of axonal loss,             Talk with neurohospitalist Dr. Iver Nestle, ED triage, will send her for hospital admission for plasma exchange, please add on following labs, immunofixative  protein electrophoresis, ANA with reflex, SSA, SSB titer, iron panel             Starting outpatient IVIG prior authorization process,  Dana [name] is a 57 y.o. female who presented [facility] the [facility] ED on 09/23/2022 with bilateral lower extremity weakness with decreased mobility that has progressed to both arms. She also reported numbness and tingling of the extremities. She was transferred to [facility] for MRI. 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  Inpatient Rehab F/B HHPT.  R knee injection 10/08/22 trigger point injections for worsening back pain 2/15  RA  PAIN:  Are you having pain? Yes: NPRS scale: up to 10/10 Pain location: all over, but her R knee pain is worst,  Pain description: Sharp Aggravating factors: Has difficulty standing up Relieving factors: Medicine helps somewhat  PRECAUTIONS: Fall  WEIGHT BEARING RESTRICTIONS: No  FALLS:  Has patient fallen in last 6 months? No  LIVING ENVIRONMENT: Lives with: lives with their family Lives in: House/apartment Stairs: Nohas a ramp Has following equipment at home: Environmental consultant - 2 wheeled, Wheelchair (power), Tour manager, bed side commode, Ramped entry, and [employer] lift  OCCUPATION: on disability due to RA and a fall several years ago  PLOF: Independent  PATIENT GOALS: walk  NEXT MD VISIT: She is scheduled to follow up with Dr. Serita Sheller with Physical Med/Rehab on 12-30-22. She is scheduled to follow up with Dr. Levert Feinstein at Neurology on 01-28-23.   OBJECTIVE:   DIAGNOSTIC FINDINGS:  Right knee radiographs 11/09/2020   FINDINGS: Severe patellofemoral joint space narrowing. Severe superior and mild inferior patellar degenerative osteophytosis. Moderate superior trochlear degenerative osteophytosis. No joint effusion. Severe medial compartment joint space narrowing with moderate peripheral medial and lateral compartment degenerative osteophytes.  COGNITION: Overall cognitive status: Within functional  limits for tasks assessed     SENSATION: Light touch: Impaired  and B hands and feet and lower legs  EDEMA:  BLE edema noted.  MUSCLE LENGTH: Hamstrings: WFL Thomas test: B tightness, to neutral  POSTURE: rounded shoulders and flexed trunk   PALPATION: No TTP  LOWER EXTREMITY ROM: BLE ROM limited in all planes an all joints due to body habitus   LOWER EXTREMITY MMT:  MMT Right eval Left eval R 05/01/23 L 05/01/23 R/L 06/05/23  Hip flexion 3 3- 4- 3+ 4/4  Hip extension 3- 3- 3 3 3   Hip abduction 3 3- 3+ 3+ 4-/3+  Hip adduction       Hip internal rotation       Hip external rotation       Knee flexion 4 4- 4 4 4+/4+  Knee extension 4- 4- 4 4 4+/4+  Ankle dorsiflexion 4- 4- 4 4 4/4  Ankle plantarflexion       Ankle inversion 4- 4-     Ankle eversion 4- 4-      (Blank rows = not tested)  FUNCTIONAL TESTS:  5 times sit to stand: unable to complete Timed up and go (TUG): unable to complete,  04/22/23  TUG 63 seconds with FWW  Bed Mobility: Occasional min A for sup <> sit, rolled B on mat with great effort, light min A  TRANSFERS: Scooting transfer with CGA, stand pivot with CGA, she reports that she sometimes has difficulty rising due to her knee pain  GAIT: Distance walked: 0' Assistive device utilized:  shopping cart-will bring her RW from home. Level of assistance: Min A Comments: Stood and weight shifted, then stood again and took steps to turn to W/C.  TODAY'S TREATMENT:                                                                                                                              DATE:  06/05/23 NuStep L5 x 6 minutes  05/13/23 NuStep L5  x 6 minutes Stand pivot transfers to and from the NuStep using cane with multiple small steps each time. Standing mini squats while holding 2# ball at chest, 2 x 5 reps Seated rotational walk outs, with opposite leg ext, 5 reps to each side Side step x 5 feet, using SPC and HHA,   05/08/23 NuStep L5 x 4  minutes, then 3 x 30 sec with increased resistance, L7 B side stepping in front of mat using SPC, CGA, 7 steps each way B lateral lunge/return x 8 each side, SPC Stepped to W/C with SPC x 5', CGA  05/01/23 NuStep L5 x 5 min then 2 x 30 sec at L7 for power. Strength re-assessment for PN Bed mob MI, Transfers MI Ambulated 1 x 40' with RW and S, improved stability.  04/22/23 Nustep level 3 x 8 minutes TUG 63 seconds with FWW Leg curls 20# Seated 5# rows and 5# extensions with pulleys Ball b/n knees squeeze Green tband clamshells Seated marches 3x10 3# LAQ 3x10 Ball in lap isometric abs Walking with CGA using FWW x 50 feet  04/08/23 NuStep L3 x 6 minutes Standing on Airex in parallel bars, performed B and ant/post weight shifts over feet, hands hovering over the bar. Stable. B side stepping in parallel bars with Red Tband positioned just above knees to add resistance to hip abd. 2 x 5 each way. Seated W/C push ups x 8 Seated reaching to each side, outside BOS x 5 each.  04/01/23 Nustep level 5 x 8 minutes TUG 65 seconds with FWW and SBA Seated ball b/n knees squeeze Red tband left hip adduction to isolate Seated marches In [employer], hip abduction and extension, some right knee pain with right leg extension In pbars practicing big steps In pbars sides stepping Pball isometric abs Leg curls 20# 2x10 Leg extension small ROM 5# 2x10 Ambulate FWW CGA 2x 20'  PATIENT EDUCATION:  Education details: POC Person educated: Patient Education method: Explanation Education comprehension: verbalized understanding  HOME EXERCISE PROGRAM: [id number]  ASSESSMENT:  CLINICAL IMPRESSION: Patient was sick for a period of time. She then had an IVIG treatment this week, which caused severe achiness and headache on Wed. Fortunately she feels better today. Functional status re-assessed for UPOC. She has demonstrated significant progress in all areas, and will benefit from continued PT to  reach goals.  OBJECTIVE IMPAIRMENTS: Abnormal gait, decreased activity tolerance, decreased balance, decreased coordination, decreased endurance, difficulty walking, decreased ROM, decreased strength, and postural dysfunction.   ACTIVITY LIMITATIONS: carrying, lifting, bending, standing, squatting, stairs, transfers, and locomotion level  PARTICIPATION LIMITATIONS: meal prep, cleaning, laundry, driving, and shopping  PERSONAL FACTORS: Fitness, Past/current experiences, and 1 comorbidity: Recently diagnosed with GBS  are also affecting patient's functional outcome.   REHAB POTENTIAL: Good  CLINICAL DECISION MAKING: Evolving/moderate complexity  EVALUATION COMPLEXITY: Moderate  GOALS: Goals reviewed with patient? Yes  SHORT TERM GOALS: Target date: 11/28/22 I with initial HEP Baseline: Goal status: met 01/09/23  LONG TERM GOALS: Target date: 03/06/23  I with final HEP Baseline:  Goal status: 06/05/23, ongoing  2.  Decrease 5x STS to < 14 sec Baseline: unable to complete Goal status: 04/22/23-32 sec, 06/05/23-15 sec, ongoing  3.  Decrease TUG to < 20 sec Baseline: Unable to complete. Goal status: 02/18/23 1:55 04/01/23 = 65 seconds 04/22/23 63 seconds, 06/05/23-41 sec, ongoing  4.  Patient will ambulate at least 300' with LRAD, MI, on level and unlevel surfaces. Baseline: Stand pivot transfer with CGA Goal status:  02/18/23-20', RW, CGA, slow, but stable throughout. 06/05/23-100', RW, ongoing  5.  Perform all bed mobility with I Baseline: Occ min A on mat Goal status: 02/18/23-met  6.  Increase BLE strength to at least 4/5 throughout. Baseline: (3-)-3/5 Goal status: 06/05/23-See MMT, ongoing.  7. Patient and husband will safely perform car transfers with LRAD Baseline: Currently uses van transportation Goal Status: 02/25/23 met  PLAN:  PT FREQUENCY: 1-2x/week  PT DURATION: 12 weeks  PLANNED INTERVENTIONS: Therapeutic exercises, Therapeutic activity, Neuromuscular re-education,  Balance training, Gait training, Patient/Family education, Self Care, Joint mobilization, Stair training, Dry Needling, Electrical stimulation, Cryotherapy, Moist heat, Ultrasound, Ionotophoresis 4mg /ml Dexamethasone, and Manual therapy  PLAN FOR NEXT SESSION: Would like to continue to push her strength, gait and overall function.    Oley Balm DPT 06/05/23 1:54 PM

## 2023-06-10 ENCOUNTER — Ambulatory Visit: Payer: 59 | Admitting: Occupational Therapy

## 2023-06-12 ENCOUNTER — Ambulatory Visit: Payer: 59 | Admitting: Occupational Therapy

## 2023-06-12 ENCOUNTER — Encounter: Payer: Self-pay | Admitting: Occupational Therapy

## 2023-06-12 DIAGNOSIS — R2681 Unsteadiness on feet: Secondary | ICD-10-CM

## 2023-06-12 DIAGNOSIS — R2689 Other abnormalities of gait and mobility: Secondary | ICD-10-CM

## 2023-06-12 DIAGNOSIS — M6281 Muscle weakness (generalized): Secondary | ICD-10-CM

## 2023-06-12 DIAGNOSIS — R278 Other lack of coordination: Secondary | ICD-10-CM

## 2023-06-12 NOTE — Therapy (Signed)
OUTPATIENT OCCUPATIONAL THERAPY NEURO TREATMENT   Patient Name: Dana [name] MRN: [id number] DOB:02/23/1966, 57 y.o., female Today's Date: 06/12/2023  PCP: Dr. Clent Ridges REFERRING PROVIDER: Dr. Gershon Crane  END OF SESSION:  OT End of Session - 06/12/23 0853     Visit Number 25    Number of Visits 40    Date for OT Re-Evaluation 07/08/23    Authorization Type UHC, Medicare    Authorization Time Period KX    Authorization - Visit Number 30    Progress Note Due on Visit 33    OT Start Time 0822    OT Stop Time 0917    OT Time Calculation (min) 55 min                        Past Medical History:  Diagnosis Date   Anxiety    Back pain with radiation    Breast mass, right    Chronic pain    Chronic, continuous use of opioids    Complication of anesthesia 04/07/14   Allergic reaction to Lisinopril immediately following surgery   DJD (degenerative joint disease)    Fibromyalgia    Groin abscess    Headache(784.0)    History of IBS    Hypercholesterolemia    IBS (irritable bowel syndrome)    Lactose intolerance    Mild hypertension    Obesity    Tobacco use disorder    Umbilical hernia    Symptomatic   Past Surgical History:  Procedure Laterality Date   ABDOMINAL HYSTERECTOMY     ANTERIOR CERVICAL DECOMP/DISCECTOMY FUSION N/A 12/20/2015   Procedure: ANTERIOR CERVICAL DECOMPRESSION FUSION CERVICAL 4-5, CERVICAL 5-6, CERVICAL 6-7 WITH INSTRUMENTATION AND ALLOGRAFT;  Surgeon: Estill Bamberg, MD;  Location: [facility] OR;  Service: Orthopedics;  Laterality: N/A;  Anterior cervical decompression fusion, cervical 4-5, cervical 5-6, cervical 6-7 with instrumentation and allograft   BACK SURGERY     BILATERAL SALPINGECTOMY  09/03/2012   Procedure: BILATERAL SALPINGECTOMY;  Surgeon: Ok Edwards, MD;  Location: [facility];  Service: Gynecology;  Laterality: Bilateral;   BREAST BIOPSY Right 04/07/2014   Procedure: REMOVAL RIGHT BREAST MASS WITH WIRE LOCALIZATION;  Surgeon: Adolph Pollack, MD;  Location: [facility] OR;  Service: General;  Laterality: Right;   BREAST EXCISIONAL BIOPSY Left    x2   BREAST LUMPECTOMY     x2   CARPAL TUNNEL RELEASE Left 05/11/2019   Procedure: LEFT CARPAL TUNNEL RELEASE, RIGHT TENNIS ELBOW MARCAINE/DEPO MEDROL INJECTION UNDER ANESTHESIA;  Surgeon: Kerrin Champagne, MD;  Location: [facility] OR;  Service: Orthopedics;  Laterality: Left;   COLONOSCOPY W/ BIOPSIES  04/24/2012   per Dr. Leone Payor, clear, repeat in 10 yrs    disectomy     ESOPHAGOGASTRODUODENOSCOPY     FINGER SURGERY     Right index-excision of mass    FOOT SURGERY Right    Bone Spurs   IR FLUORO GUIDE CV LINE RIGHT  01/29/2023   IR REMOVAL TUN CV CATH W/O FL  02/08/2023   IR US GUIDE VASC ACCESS RIGHT  01/31/2023   LAPAROSCOPIC HYSTERECTOMY  09/03/2012   Procedure: HYSTERECTOMY TOTAL LAPAROSCOPIC;  Surgeon: Ok Edwards, MD;  Location: [facility];  Service: Gynecology;  Laterality: N/A;   LUMBAR DISC SURGERY     TUBAL LIGATION     UMBILICAL HERNIA REPAIR N/A 05/01/2018   Procedure: UMBILICAL HERNIA REPAIR;  Surgeon: Jimmye Norman, MD;  Location: [facility] OR;  Service: General;  Laterality:  N/A;   Patient Active Problem List   Diagnosis Date Noted   CIDP (chronic inflammatory demyelinating polyneuropathy) (HCC) 01/28/2023   Gait abnormality 01/28/2023   Glaucoma 01/28/2023   Pain due to onychomycosis of toenails of both feet 01/21/2023   Wheelchair dependence 12/30/2022   Nerve pain 12/30/2022   Insomnia due to medical condition 12/30/2022   Acute inflammatory demyelinating polyneuropathy (HCC) 10/01/2022   UTI (urinary tract infection) 09/24/2022   Hypomagnesemia 09/24/2022   Weakness 09/23/2022   Hypokalemia 09/23/2022   B12 deficiency 09/05/2022   Folate deficiency 09/05/2022   Carpal tunnel syndrome, left upper limb 05/11/2019    Class: Chronic   Spinal stenosis of lumbar region with neurogenic claudication 08/20/2018   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   Primary  osteoarthritis of right knee 02/27/2017   Right tennis elbow 11/11/2016   Muscle cramps 01/15/2016   Bilateral leg edema 01/08/2016   Radiculopathy 12/20/2015   Hyperglycemia 12/21/2014   Mastodynia, female 11/30/2014   S/P excision of fibroadenoma of breast 11/30/2014   Angioedema of lips 04/07/2014   Fibroadenoma of right breast 03/07/2014   Pelvic pain in female 06/19/2012   Menorrhagia 06/11/2012   SUI (stress urinary incontinence, female) 06/11/2012   HTN (hypertension) 06/11/2012   IBS (irritable bowel syndrome) - diarrhea predominant 03/17/2012   MICROSCOPIC HEMATURIA 11/30/2010   BREAST PAIN, RIGHT 11/30/2010   BREAST MASS, RIGHT 01/12/2010   HYPERCHOLESTEROLEMIA 12/07/2007   CIGARETTE SMOKER 12/07/2007   DEGENERATIVE JOINT DISEASE 12/07/2007   Low back pain with sciatica 12/07/2007   HEADACHE 12/07/2007   Obesity, Class III, BMI 40-49.9 (morbid obesity) (HCC) 08/03/2007   Anxiety state 08/03/2007    ONSET DATE: 12/05/22- referral date  REFERRING DIAG: G61.0 (ICD-10-CM) - Guillain Barr syndrome (HCC)  THERAPY DIAG:    Rationale for Evaluation and Treatment: Rehabilitation  SUBJECTIVE:   SUBJECTIVE STATEMENT: Pt reports she had  a reaction to her IVIG PERTINENT HISTORY: Pt is a 57 y/o female hospitalized 09/23/22  with progressive weakness and sensation changes, consistent with Guillain-Barr syndrome. MRI negative. LP results and assessment most consistent with Guillain-Barr syndrome.  Pt was d/c home from CIR 11/06/22 PMH includes: glaucoma anxiety, DJD, fibromyalgia, HTN, tobacco use, B-12 deficiency, OA, ACDF 2017.  Pt was re-hospitalized 01/28/2456 with CIDP (chronic inflammatory demyelinating polyneuropathy). Saw Neuro on day of admission, NCS showed demyelination, referred to hospital for PLEX. Initially diagnosed with an acute demyelinating neuropathy Jan 2024, treated with IVIG. PMH: Spinal stenosis, HTN, Obesity   PRECAUTIONS: Fall  WEIGHT BEARING  RESTRICTIONS: no PAIN: no  A      FALLS: Has patient fallen in last 6 months? No  LIVING ENVIRONMENT: Lives with: lives with their spouse Lives in: House/apartment Stairs: has ramp built Has following equipment at home:   BSC, steadi,  tub bench but can't access bathroom   PLOF: Independent  PATIENT GOALS: increase independence   OBJECTIVE:   HAND DOMINANCE: Right  ADLs: from initial eval Overall ADLs: assistance required, unable to access BR Transfers/ambulation related to ADLs: Eating: unable to cut food Grooming: set up UB Dressing: set up for bra and shirt LB Dressing: max A Toileting: uses BSC uses steadi Bathing: min-mod A in hospital  bed  Tub Shower transfers: n/a Equipment: Emergency planning/management officer  IADLs:dependent for IADLS   MOBILITY STATUS:  uses w/c  , per PT report minguard for transfer from w/c to mat    ACTIVITY TOLERANCE: Activity tolerance: Pt fatigues quickly    UPPER EXTREMITY ROM:  see updates for 02/18/23 re-eval   Active ROM Right 02/18/23 Left 02/18/23  Shoulder flexion 130 120  Shoulder abduction 90 90  Shoulder adduction    Shoulder extension    Shoulder internal rotation    Shoulder external rotation    Elbow flexion    Elbow extension  -5  Wrist flexion    Wrist extension    Wrist ulnar deviation    Wrist radial deviation    Wrist pronation    Wrist supination    (Blank rows = not tested)  UPPER EXTREMITY MMT:   see below for re-eval 02/18/23  MMT Right 02/18/23 Left 02/18/23  Shoulder flexion 3+/5 3+/5  Shoulder abduction    Shoulder adduction    Shoulder extension    Shoulder internal rotation    Shoulder external rotation    Middle trapezius    Lower trapezius    Elbow flexion 4/5 4/5  Elbow extension 4-/5 4-/5  Wrist flexion    Wrist extension    Wrist ulnar deviation    Wrist radial deviation    Wrist pronation    Wrist supination      HAND FUNCTION: Grip strength: Right: 32 lbs; Left: 18 lbs, 02/18/23 R 55  lbs, L 35 lbs 05/13/23- grip RUE 42, LUE 34  COORDINATION: 9 Hole Peg test:initial eval  Right: 1 min 48 sec; Left: 4 pegs in 2 mins  Box and Blocks:  Right 35 blocks, Left  26 blocks 02/18/23- RUE 35.46, LUE 65 secs  SENSATION: Light touch: WFL Hot/Cold: Not tested    COGNITION: Overall cognitive status:NT   VISION: Subjective report: reports hx of glaucoma VISION ASSESSMENT: Not tested  OBSERVATIONS: Pt is eager to improve, pt's husband is very supportive   TODAY'S TREATMENT:                                                                                                                              DATE:06/11/23- Arm bike x 6 mins for conditioning Pt ambulated with walker to mat 25' with RW and supervision Seated on edge of mat: biceps curls, shoulder flexion, chest press holding 1 lbs weight in each hand 2 sets of 10 reps for increased UE strength for ADLs. ( Pt then trialed 2 lbs weight, pt performed 3-5 reps of each, pt plans to order 1 and 2 lbs weights of the internet, pt was instructed that if she uses 2 lbs weight she should reduce reps)Pt then ambulated form mat to w/c grossly 25' with walker with supevision Fine motor coordination activity placing and removing graded pegs from peg board with LUE, min difficulty/ v.c   06/05/23-Pt reports being sick then having her IVIG treatment  Pt performed fine motor coordination activity to to copy small peg design with LUE, min difficulty and increased time required, min v.c for design. Standing to place and remove graded clothespins 1-8# from vertical antennae with left and right UE , pt required 1 rest  break due to fatigue. Red theraband exercises for increased strength and endurance for the following :abduction , biceps and triceps, 15 reps each  05/13/23 Pt ambulated to and from arm bike with cane and min a grossly 3 ft.  Arm bike x 6 mins level 3 for conditioning Therapist checked progress towards goals and discussed pt  performance towards goals. Pt would like to continue therapy. Pt stood for 1 min 18 secs for functional reaching prior to rest break. Pt stood a second time for grossly 1 min prior to rest break. Frequent rest breaks are required due to pt's recent IVIG, and pt states she gets fatigued more quickly.   05/08/23- Pt ambulated grossly 3 ft with cane with minguard to and from to arm bike. Arm bike x 6 mins level 1 for conditioning Pt copied small peg design with left and right UE's for increased fine motor coordination, mod difficulty for LUE and drops, occasional min difficulty with RUE. Pt's LUE continues to fatigue quickly. Standing to perform functional reaching to retrieve items from chair to place on elevated surface with close supervision for standing balance and tolerance. Rest break required then pt was able to return items to lower surface with RUE with supervision. Pt continues to fatigue quickly and she stood for less than a minute.    05/01/23- Arm bike x 6 mins level 2 for conditioning, pt walked to and from arm bike with walker and supervision. Placing and removing grooved pegs from pegboard with LUE for increased fine motor coordination, removing with in hand manipulation, min difficulty/ drops and v.c Placing graded clothespins 1-8# on vertical antennae with LUE then removing with RUE for sustained pinch, min v.c  Gripper set at level 2, pt picked up blocks with LUE for sustained grip, min difficulty/ drops   04/22/23:  This progress update covers dates of service from 6/4-04/22/23.   Started session working on LUE coordination.  Copying small peg patterns.  Patient with frequent drops, difficulty orienting small pegs in hand.  Patient with report of diminished sensation in left hand.  Attempted same task with left hand in glove with slightly increased ability initially - however - not able to sustain.   Worked on hand strengthening with gripper level 2 x 20 blocks.  Faciliatation an  dcueing to reduce trunk lateral flexion and shoulder elevation.  Attempted 5 blocks at level 3 - fatigued after 3 blocks.   Standing balance/ tolerance at counter - working toward reducing UE reliance - reaching into chest high shelf, reaching to shelf at eye level.  Stand with walker and taking slow cautious steps to counter.    04/08/23-Pt ambulated approximately 5 feet to walk to arm bike with rolling walker with close supervision. Arm bike x 6 mins level 1 for conditioning Min a for sit to stand from armbike then pt ambulated back to w/c. Seated in w/c closed chain shoulder flexion, with unweighted ball followed by shoulder flexion then trunk rotation and chest press wih 2.2 lbs weighted ball for UE strength and endurance, rest between sets. Fine motor coordination task to copy small peg design with LUE, min-mod difficulty, v.c to avoid shoulder hike/ compensation.  04/01/23- Seated closed chain shoulder flexion, with unweighted ball followed by shoulder flexion then trunk rotation and chest press wih 2.2 lbs weighted ball. Rest in between sets seated on mat. Pt walker to mat with walker and supervision. Standing at countertop functional reaching to place graded clothespins in targets with RUE, rest break  then pt removed with left hand, close supervision. Discussed plans to continue OT after next week. Pt walked mat to w/c with walker and close supervision .   03/27/23-Theraband exercises for biceps curls, triceps, rowing  and shoulder extension with green band and shoulder abduction with red band 10-15 reps each, min v.c, rest break in between sets Standing at countertop to place and remove items from lowest overhead shelf, seated rest break prior to replacing, close supervision for moblity with walker  Gripper set at level 2 sustained grip to pick up 1 inch blocks, 1/2 with RUE with min difficulty and 1/2 with left with min-mod difficulty for sustained grip Graded clothespins for sustained pinch  1-8# using left and right UE's for sustained pinch, RUE only with black clothespins dur to difficulty. Therapist recommends that pt orders a long handled sponge to apply lotion onto legs. Pt with continued bilateral LE swelling.  03/25/23 Pt is hoarse today and reports a tickling in her throat as well as LE swelling. Therapist recommends pt calls MD.Pt arrived late as her transportation did not pick her up. Arm bike x 6 mins level 1 for conditioning.Pt transferred to and from arm bike to w/c with min A. Copying small peg design for increased fine motor coordination with LUE, min difficulty and increased time, removing with in hand manipulation. Pt demonstrates improved fine motor coordination.  03/18/23- Pt amb across gym with RW and minguard to arm bike.  Arm bike x 7 mins level 1  for conditioning Placing small pegs into pegboard with LUE for increased fine motor coordination, pt with improved performance and coordination today. Pt walked 3 ft from chair to counter and stood to place and remove items from overhead shelves with 1 seated rest break. Discussion with patient regarding her home situation,. Pt reports frustration and she feels like she is a burden on husband. Therapist recommends pt seeks counseling. Shoulder abduction  with red band biceps curls with green band  20 reps for UE strengthening   03/13/23-Pt reports she had a stomach bug and she wasn't feeling well. Placing large pegs into pegboard with LUE with occasional min difficulty, then removing with 3 pt pinch min v.c to avoid shoulder hike. Red theraband exercises for shoulder abduction, rowing and extension 15 reps each,  green band for biceps curls and triceps extension 15 reps each, min v.c Arm bike x 6 mins level 3 for conditioning, pt transfered to and from with min A Discussed safety for cooking and recommendation that if pt decides to fry fish, she performs with someone for safety, and she keeps her LUE away from hot surfaces  due to sensory deficits. Pt verbalized understanding.   03/04/23 Red theraband exercises for shoulder abduction, rowing and extension 20 reps each, upgraded to green band for biceps curls and triceps extension 20 reps each, min v.c Arm bike x 8 mins, 4 mins each direction, level 2 for conditioning, pt transferred to and from seat with supervision. Coordination activities with LUE: flipping and dealing cards, min difficulty/ v.c  02/27/23-Arm bike x 6 mins level 2 for conditioning (Pt transfers to and from w/c with supervision) Placing large pegs into pegboard with LUE then removing for increased fine motor coordination, min difficulty/ v.c  Standing at countertop with supervision to place and remove items with right then left UE's, pt stood x 3 for 30 secs to 1 min 15 secs, rest breaks in between Gripper set at level 1 to pick up 1 inch blocks  with right and left UE's for sustained grip, min difficulty Pt reports amb to BR with walker without assist, therapist recommends supervision for safety.  02/25/23-Arm bike x 6 mins level 2 for conditioning, Pt transferred to the seat for the first time with supervision Reviewed previously issued red theraband exercises for UE strength 20 reps each, min v.c  Fine motor coordination exercises with left and right UE's, flipping playing cards and stacking coins with min difficulty using RUE, picking up coins and placing in container with LUE and flipping cards mod difficulty. Pt attempted placing small pegs into a pegboard with bilateral UE's max difficulty with LUE, mod difficulty with RUE, task was discontinued. Pt reports being a little off. She took her meds without eating and got into a disagreement with transportation this a.m.    02/18/23- re-eval performed and therapist discussed potential goals with patient.  PATIENT EDUCATION: Education details:    progress towards goals and plans to renew Person educated: Patient Education method: explanation,  demonstration Education comprehension: verbalized understanding, returned demonstration.  HOME EXERCISE PROGRAM:  12/17/22- coordination HEP, cane exercises seated   GOALS: Goals reviewed with patient? Yes  SHORT TERM GOALS: Target date: 06/12/23  I with initial HEP Baseline: Goal status: met 01/21/23  2.  Pt will demonstrate improved UE functional use as evidenced increasing bilateral box/ blocks score by 4 blocks.  Baseline: R 35, L 26 Goal status  met 12/31/22 RUE :43:  LUE 35    3.  Pt will donn pants with mod A. Baseline: max A Goal status:met  02/18/23- pt performed mod I today  4. Pt will verbalize understanding of adapted strategies to maximize safety and I with ADLs/ IADLs .  Goal status:  ongoing, has tub bench and long handled sponge  8/6  ongoing - patient relies on husband to lift legs into tub - encouraging patient to increase participation with IADL at home.    5.  Pt will demonstrate ability to stand for functional activity with min A x 10 mins in prep for ADLs.  Revised short term goal 05/13/23 -Pt will stand for 5 mins prior to rest break in prep for ADLS.  Goal status: ongoing pt stood for 1 min on 02/18/23 8/6:  Stood x 2 min in clinic - patient reporting increased fatigue due to IVIG treatment.  05/13/23-Stood 1 min 18 secs  6.  Pt will perform w/c to [facility] transfers mod I-  mod I per pt report 05/08/23  Goal status:  met, 02/18/23 7. Pt will increase bilateral grip strength by 5 lbs for increased functional use of UE's  Baseline 05/13/23-RUE 42, LUE 34  Goal status: new     LONG TERM GOALS: Target date: 07/08/23 I with updated HEP  Goal status: ongoing, 05/13/23  2.  Pt will perform LB dressing mod I. Baseline:max A Goal status:met 03/25/23- 8/6- patient indicates she is dressing herself.   3.  Pt will bathe with supervision/ set up. Baseline: min-mod A for sponge bath Goal status: duplicate goal, therefore deferred,  see below, pt met for sponge  bathing.  4.  Pt will perform basic cooking at a w/c level mod I  Revised goal: Pt will perform basic cooking modified independently at a walker level.  Goal status:  Ongoing, pt is currently performing at a w/c level 05/13/23, not walking in kitchen  5.  Pt will demonstrate improved fine motor coordination as evidenced by performing LUE 9 hole peg test in 2 mins or  less. - initial goal met Revised goal:Pt will demonstrate improved fine motor coordination as evidenced by performing 9 hole peg test in 55 secs or less  8/6- 63 seconds- ongoing  Goal status: ongoing- 1 min 17 secs on 05/13/23  6.  Pt will demonstrate improved fine motor coordination as evidenced by performing RUE 9 hole peg test in 1 mins  30 secs or less.  Baseline: RUE  1 min 48 secs, LUE 4 pegs placed in 2 mins  Goal status: met 6/4/246/4/24- RUE 35.46,     7. Pt will perform shower with supervision using tub transfer bench Goal status: ongoing,  partially met pt has assist with lifting legs into tub, then she completes bathing at a supervision level, has assist with drying 05/13/23    8. Pt will perform basic home management at a walker level mod I    Goal status: ongoing, 05/13/23- still performing at w/c level, not consistent    CLINICAL IMPRESSION: Pt is progressing towards goals. She report feeling very anxious last weekend as if she was having panic attack. Pt was encouraged to contact her MD and consider counseling. Pt demonstrates improving coordination and functional mobility. PERFORMANCE DEFICITS: in functional skills including ADLs, IADLs, coordination, dexterity, sensation, ROM, strength, flexibility, Fine motor control, Gross motor control, mobility, balance, endurance, decreased knowledge of precautions, decreased knowledge of use of DME, and UE functional use, cognitive skills including  and psychosocial skills including coping strategies, environmental adaptation, habits, interpersonal interactions, and routines  and behaviors.   IMPAIRMENTS: are limiting patient from ADLs, IADLs, rest and sleep, play, leisure, and social participation.   CO-MORBIDITIES: may have co-morbidities  that affects occupational performance. Patient will benefit from skilled OT to address above impairments and improve overall function.  MODIFICATION OR ASSISTANCE TO COMPLETE EVALUATION: No modification of tasks or assist necessary to complete an evaluation.  OT OCCUPATIONAL PROFILE AND HISTORY: Detailed assessment: Review of records and additional review of physical, cognitive, psychosocial history related to current functional performance.  CLINICAL DECISION MAKING: LOW - limited treatment options, no task modification necessary  REHAB POTENTIAL: Good  EVALUATION COMPLEXITY: Low    PLAN:  OT FREQUENCY: 2x/week  OT DURATION: 8 weeks  PLANNED INTERVENTIONS: self care/ADL training, therapeutic exercise, therapeutic activity, neuromuscular re-education, manual therapy, passive range of motion, gait training, balance training, stair training, functional mobility training, aquatic therapy, ultrasound, paraffin, moist heat, cryotherapy, patient/family education, energy conservation, coping strategies training, DME and/or AE instructions, and Re-evaluation  RECOMMENDED OTHER SERVICES: PT  CONSULTED AND AGREED WITH PLAN OF CARE: Patient and family member/caregiver  PLAN FOR NEXT SESSION: continue to work towards updated goals, coordination, standing tolerance for ADLs.  Nicholl Onstott, OT 06/12/2023, 9:28 AM          3

## 2023-06-13 ENCOUNTER — Telehealth: Payer: Self-pay | Admitting: Neurology

## 2023-06-13 NOTE — Telephone Encounter (Signed)
[facility] ([facility]) Pt is having common side effect and having extreme side effect. Pt needs more hydration to counteract that. Sending orders over to see on Monday.

## 2023-06-16 ENCOUNTER — Telehealth: Payer: Self-pay

## 2023-06-16 NOTE — Telephone Encounter (Signed)
  Faxed orders to [facility]

## 2023-06-17 ENCOUNTER — Ambulatory Visit: Payer: 59 | Admitting: Occupational Therapy

## 2023-06-19 ENCOUNTER — Ambulatory Visit: Payer: 59 | Admitting: Physical Therapy

## 2023-06-26 ENCOUNTER — Ambulatory Visit: Payer: 59 | Attending: Family Medicine | Admitting: Physical Therapy

## 2023-06-26 ENCOUNTER — Encounter: Payer: Self-pay | Admitting: Physical Therapy

## 2023-06-26 ENCOUNTER — Ambulatory Visit: Payer: 59 | Admitting: Occupational Therapy

## 2023-06-26 DIAGNOSIS — G6181 Chronic inflammatory demyelinating polyneuritis: Secondary | ICD-10-CM | POA: Insufficient documentation

## 2023-06-26 DIAGNOSIS — R278 Other lack of coordination: Secondary | ICD-10-CM

## 2023-06-26 DIAGNOSIS — R2681 Unsteadiness on feet: Secondary | ICD-10-CM | POA: Diagnosis present

## 2023-06-26 DIAGNOSIS — M6281 Muscle weakness (generalized): Secondary | ICD-10-CM | POA: Diagnosis present

## 2023-06-26 DIAGNOSIS — R2689 Other abnormalities of gait and mobility: Secondary | ICD-10-CM | POA: Diagnosis present

## 2023-06-26 DIAGNOSIS — R262 Difficulty in walking, not elsewhere classified: Secondary | ICD-10-CM | POA: Diagnosis present

## 2023-06-26 NOTE — Therapy (Signed)
OUTPATIENT PHYSICAL THERAPY LOWER EXTREMITY TREATMENT   Patient Name: Dana [name] MRN: [id number] DOB:1965/12/31, 57 y.o., female Today's Date: 06/26/2023  END OF SESSION:  PT End of Session - 06/26/23 0852     Visit Number 24    Date for PT Re-Evaluation 07/03/23    PT Start Time 0845    PT Stop Time 0925    PT Time Calculation (min) 40 min    Equipment Utilized During Treatment Gait belt    Activity Tolerance Patient tolerated treatment well    Behavior During Therapy [facility] for tasks assessed/performed             Past Medical History:  Diagnosis Date   Anxiety    Back pain with radiation    Breast mass, right    Chronic pain    Chronic, continuous use of opioids    Complication of anesthesia 04/07/14   Allergic reaction to Lisinopril immediately following surgery   DJD (degenerative joint disease)    Fibromyalgia    Groin abscess    Headache(784.0)    History of IBS    Hypercholesterolemia    IBS (irritable bowel syndrome)    Lactose intolerance    Mild hypertension    Obesity    Tobacco use disorder    Umbilical hernia    Symptomatic   Past Surgical History:  Procedure Laterality Date   ABDOMINAL HYSTERECTOMY     ANTERIOR CERVICAL DECOMP/DISCECTOMY FUSION N/A 12/20/2015   Procedure: ANTERIOR CERVICAL DECOMPRESSION FUSION CERVICAL 4-5, CERVICAL 5-6, CERVICAL 6-7 WITH INSTRUMENTATION AND ALLOGRAFT;  Surgeon: Estill Bamberg, MD;  Location: [facility] OR;  Service: Orthopedics;  Laterality: N/A;  Anterior cervical decompression fusion, cervical 4-5, cervical 5-6, cervical 6-7 with instrumentation and allograft   BACK SURGERY     BILATERAL SALPINGECTOMY  09/03/2012   Procedure: BILATERAL SALPINGECTOMY;  Surgeon: Ok Edwards, MD;  Location: [facility] ORS;  Service: Gynecology;  Laterality: Bilateral;   BREAST BIOPSY Right 04/07/2014   Procedure: REMOVAL RIGHT BREAST MASS WITH WIRE LOCALIZATION;  Surgeon: Adolph Pollack, MD;  Location: [facility] OR;  Service: General;   Laterality: Right;   BREAST EXCISIONAL BIOPSY Left    x2   BREAST LUMPECTOMY     x2   CARPAL TUNNEL RELEASE Left 05/11/2019   Procedure: LEFT CARPAL TUNNEL RELEASE, RIGHT TENNIS ELBOW MARCAINE/DEPO MEDROL INJECTION UNDER ANESTHESIA;  Surgeon: Kerrin Champagne, MD;  Location: [facility] OR;  Service: Orthopedics;  Laterality: Left;   COLONOSCOPY W/ BIOPSIES  04/24/2012   per Dr. Leone Payor, clear, repeat in 10 yrs    disectomy     ESOPHAGOGASTRODUODENOSCOPY     FINGER SURGERY     Right index-excision of mass    FOOT SURGERY Right    Bone Spurs   IR FLUORO GUIDE CV LINE RIGHT  01/29/2023   IR REMOVAL TUN CV CATH W/O FL  02/08/2023   IR US GUIDE VASC ACCESS RIGHT  01/31/2023   LAPAROSCOPIC HYSTERECTOMY  09/03/2012   Procedure: HYSTERECTOMY TOTAL LAPAROSCOPIC;  Surgeon: Ok Edwards, MD;  Location: [facility] ORS;  Service: Gynecology;  Laterality: N/A;   LUMBAR DISC SURGERY     TUBAL LIGATION     UMBILICAL HERNIA REPAIR N/A 05/01/2018   Procedure: UMBILICAL HERNIA REPAIR;  Surgeon: Jimmye Norman, MD;  Location: [facility] OR;  Service: General;  Laterality: N/A;   Patient Active Problem List   Diagnosis Date Noted   CIDP (chronic inflammatory demyelinating polyneuropathy) (HCC) 01/28/2023   Gait abnormality 01/28/2023   Glaucoma  01/28/2023   Pain due to onychomycosis of toenails of both feet 01/21/2023   Wheelchair dependence 12/30/2022   Nerve pain 12/30/2022   Insomnia due to medical condition 12/30/2022   Acute inflammatory demyelinating polyneuropathy (HCC) 10/01/2022   UTI (urinary tract infection) 09/24/2022   Hypomagnesemia 09/24/2022   Weakness 09/23/2022   Hypokalemia 09/23/2022   B12 deficiency 09/05/2022   Folate deficiency 09/05/2022   Carpal tunnel syndrome, left upper limb 05/11/2019    Class: Chronic   Spinal stenosis of lumbar region with neurogenic claudication 08/20/2018   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   Primary osteoarthritis of right knee 02/27/2017   Right tennis elbow  11/11/2016   Muscle cramps 01/15/2016   Bilateral leg edema 01/08/2016   Radiculopathy 12/20/2015   Hyperglycemia 12/21/2014   Mastodynia, female 11/30/2014   S/P excision of fibroadenoma of breast 11/30/2014   Angioedema of lips 04/07/2014   Fibroadenoma of right breast 03/07/2014   Pelvic pain in female 06/19/2012   Menorrhagia 06/11/2012   SUI (stress urinary incontinence, female) 06/11/2012   HTN (hypertension) 06/11/2012   IBS (irritable bowel syndrome) - diarrhea predominant 03/17/2012   MICROSCOPIC HEMATURIA 11/30/2010   BREAST PAIN, RIGHT 11/30/2010   BREAST MASS, RIGHT 01/12/2010   HYPERCHOLESTEROLEMIA 12/07/2007   CIGARETTE SMOKER 12/07/2007   Osteoarthritis 12/07/2007   Low back pain with sciatica 12/07/2007   Headache 12/07/2007   Obesity, Class III, BMI 40-49.9 (morbid obesity) (HCC) 08/03/2007   Anxiety state 08/03/2007    PCP: Nelwyn Salisbury, MD  REFERRING PROVIDER: Nelwyn Salisbury, MD  REFERRING DIAG: G61.0 (ICD-10-CM) - Guillain Barr syndrome [facility])   THERAPY DIAG:  Muscle weakness (generalized)  Other lack of coordination  Unsteadiness on feet  CIDP (chronic inflammatory demyelinating polyneuropathy) (HCC)  Difficulty in walking, not elsewhere classified  Rationale for Evaluation and Treatment: Rehabilitation  ONSET DATE: 12/05/22  SUBJECTIVE:   SUBJECTIVE STATEMENT: Patient has been sick.   OBJECTIVE STATEMENT: 01/28/23 ASSESSMENT AND PLAN   Dana [name] is a 57 y.o. female   Demyelinating polyradiculoneuropathy             Symptom onset monophasic since September 2024, failed to respond to IVIG in January 2024,             EMG nerve conduction study showed demyelinating features, no evidence of axonal loss,             Talk with neurohospitalist Dr. Iver Nestle, ED triage, will send her for hospital admission for plasma exchange, please add on following labs, immunofixative protein electrophoresis, ANA with reflex, SSA, SSB titer, iron  panel             Starting outpatient IVIG prior authorization process,  Dana [name] is a 57 y.o. female who presented to the [facility] ED on 09/23/2022 with bilateral lower extremity weakness with decreased mobility that has progressed to both arms. She also reported numbness and tingling of the extremities. She was transferred to [facility] for [facility]. 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  Inpatient Rehab F/B HHPT.  R knee injection 10/08/22 trigger point injections for worsening back pain 2/15  RA  PAIN:  Are you having pain? Yes: NPRS scale: up to 10/10 Pain location: all over, but her R knee pain is worst,  Pain description: Sharp Aggravating factors: Has difficulty standing up Relieving factors: Medicine helps somewhat  PRECAUTIONS: Fall  WEIGHT BEARING RESTRICTIONS: No  FALLS:  Has patient fallen in last 6 months? No  LIVING ENVIRONMENT: Lives with: lives with their family Lives in: House/apartment Stairs: Nohas a ramp Has following equipment at home: Environmental consultant - 2 wheeled, Wheelchair (power), Tour manager, bed side commode, Ramped entry, and [employer] lift  OCCUPATION: on disability due to RA and a fall several years ago  PLOF: Independent  PATIENT GOALS: walk  NEXT MD VISIT: She is scheduled to follow up with Dr. Serita Sheller with Physical Med/Rehab on 12-30-22. She is scheduled to follow up with Dr. Levert Feinstein at Neurology on 01-28-23.   OBJECTIVE:   DIAGNOSTIC FINDINGS:  Right knee radiographs 11/09/2020   FINDINGS: Severe patellofemoral joint space narrowing. Severe superior and mild inferior patellar degenerative osteophytosis. Moderate superior trochlear degenerative osteophytosis. No joint effusion. Severe medial compartment joint space narrowing with moderate peripheral medial and lateral compartment degenerative osteophytes.  COGNITION: Overall cognitive status: Within functional limits for tasks assessed     SENSATION: Light touch: Impaired   and B hands and feet and lower legs  EDEMA:  BLE edema noted.  MUSCLE LENGTH: Hamstrings: WFL Thomas test: B tightness, to neutral  POSTURE: rounded shoulders and flexed trunk   PALPATION: No TTP  LOWER EXTREMITY ROM: BLE ROM limited in all planes an all joints due to body habitus   LOWER EXTREMITY MMT:  MMT Right eval Left eval R 05/01/23 L 05/01/23 R/L 06/05/23  Hip flexion 3 3- 4- 3+ 4/4  Hip extension 3- 3- 3 3 3   Hip abduction 3 3- 3+ 3+ 4-/3+  Hip adduction       Hip internal rotation       Hip external rotation       Knee flexion 4 4- 4 4 4+/4+  Knee extension 4- 4- 4 4 4+/4+  Ankle dorsiflexion 4- 4- 4 4 4/4  Ankle plantarflexion       Ankle inversion 4- 4-     Ankle eversion 4- 4-      (Blank rows = not tested)  FUNCTIONAL TESTS:  5 times sit to stand: unable to complete Timed up and go (TUG): unable to complete,  04/22/23  TUG 63 seconds with FWW  Bed Mobility: Occasional min A for sup <> sit, rolled B on mat with great effort, light min A  TRANSFERS: Scooting transfer with CGA, stand pivot with CGA, she reports that she sometimes has difficulty rising due to her knee pain  GAIT: Distance walked: 0' Assistive device utilized:  shopping cart-will bring her RW from home. Level of assistance: Min A Comments: Stood and weight shifted, then stood again and took steps to turn to W/C.  TODAY'S TREATMENT:                                                                                                                              DATE:  06/26/23 NuStep L4 x 6 minutes Alternately tapping 6" step with BUE support on RW, attempting to decrease reliance on UE, 15 each leg, more challenging  standing on R. Standing B weight shifts over BOS, then quick step taps to each side. Performed balance training- Step forward with RLE, reach to pick up cone, then step back and turn R leg out, with weight shift back onto the R leg, reaching to place on mat. Repeated x 6 each  direction, no UE support. B side stepping on airex pad in parallel bars x 1, challenging.  06/05/23 NuStep L5 x 6 minutes  05/13/23 NuStep L5 x 6 minutes Stand pivot transfers to and from the NuStep using cane with multiple small steps each time. Standing mini squats while holding 2# ball at chest, 2 x 5 reps Seated rotational walk outs, with opposite leg ext, 5 reps to each side Side step x 5 feet, using SPC and HHA,   05/08/23 NuStep L5 x 4 minutes, then 3 x 30 sec with increased resistance, L7 B side stepping in front of mat using SPC, CGA, 7 steps each way B lateral lunge/return x 8 each side, SPC Stepped to W/C with SPC x 5', CGA  05/01/23 NuStep L5 x 5 min then 2 x 30 sec at L7 for power. Strength re-assessment for PN Bed mob MI, Transfers MI Ambulated 1 x 40' with RW and S, improved stability.  04/22/23 Nustep level 3 x 8 minutes TUG 63 seconds with FWW Leg curls 20# Seated 5# rows and 5# extensions with pulleys Ball b/n knees squeeze Green tband clamshells Seated marches 3x10 3# LAQ 3x10 Ball in lap isometric abs Walking with CGA using FWW x 50 feet  04/08/23 NuStep L3 x 6 minutes Standing on Airex in parallel bars, performed B and ant/post weight shifts over feet, hands hovering over the bar. Stable. B side stepping in parallel bars with Red Tband positioned just above knees to add resistance to hip abd. 2 x 5 each way. Seated W/C push ups x 8 Seated reaching to each side, outside BOS x 5 each.  04/01/23 Nustep level 5 x 8 minutes TUG 65 seconds with FWW and SBA Seated ball b/n knees squeeze Red tband left hip adduction to isolate Seated marches In [employer], hip abduction and extension, some right knee pain with right leg extension In pbars practicing big steps In pbars sides stepping Pball isometric abs Leg curls 20# 2x10 Leg extension small ROM 5# 2x10 Ambulate FWW CGA 2x 20'  PATIENT EDUCATION:  Education details: POC Person educated:  Patient Education method: Explanation Education comprehension: verbalized understanding  HOME EXERCISE PROGRAM: [id number]  ASSESSMENT:  CLINICAL IMPRESSION: Patient was sick for a period of time. She returns stronger than ever. Progressed her challenges in therapy as tolerated, including taking a few steps without her RW.  OBJECTIVE IMPAIRMENTS: Abnormal gait, decreased activity tolerance, decreased balance, decreased coordination, decreased endurance, difficulty walking, decreased ROM, decreased strength, and postural dysfunction.   ACTIVITY LIMITATIONS: carrying, lifting, bending, standing, squatting, stairs, transfers, and locomotion level  PARTICIPATION LIMITATIONS: meal prep, cleaning, laundry, driving, and shopping  PERSONAL FACTORS: Fitness, Past/current experiences, and 1 comorbidity: Recently diagnosed with GBS  are also affecting patient's functional outcome.   REHAB POTENTIAL: Good  CLINICAL DECISION MAKING: Evolving/moderate complexity  EVALUATION COMPLEXITY: Moderate  GOALS: Goals reviewed with patient? Yes  SHORT TERM GOALS: Target date: 11/28/22 I with initial HEP Baseline: Goal status: met 01/09/23  LONG TERM GOALS: Target date: 03/06/23  I with final HEP Baseline:  Goal status: 06/05/23, ongoing  2.  Decrease 5x STS to < 14 sec Baseline: unable to complete Goal status:  04/22/23-32 sec, 06/05/23-15 sec, ongoing  3.  Decrease TUG to < 20 sec Baseline: Unable to complete. Goal status: 02/18/23 1:55 04/01/23 = 65 seconds 04/22/23 63 seconds, 06/05/23-41 sec, ongoing  4.  Patient will ambulate at least 300' with LRAD, MI, on level and unlevel surfaces. Baseline: Stand pivot transfer with CGA Goal status: 02/18/23-20', RW, CGA, slow, but stable throughout. 06/05/23-100', RW, ongoing  5.  Perform all bed mobility with I Baseline: Occ min A on mat Goal status: 02/18/23-met  6.  Increase BLE strength to at least 4/5 throughout. Baseline: (3-)-3/5 Goal status:  06/05/23-See MMT, ongoing.  7. Patient and husband will safely perform car transfers with LRAD Baseline: Currently uses van transportation Goal Status: 02/25/23 met  PLAN:  PT FREQUENCY: 1-2x/week  PT DURATION: 12 weeks  PLANNED INTERVENTIONS: Therapeutic exercises, Therapeutic activity, Neuromuscular re-education, Balance training, Gait training, Patient/Family education, Self Care, Joint mobilization, Stair training, Dry Needling, Electrical stimulation, Cryotherapy, Moist heat, Ultrasound, Ionotophoresis 4mg /ml Dexamethasone, and Manual therapy  PLAN FOR NEXT SESSION: Would like to continue to push her strength, gait and overall function.    Oley Balm DPT 06/26/23 10:25 AM

## 2023-06-26 NOTE — Therapy (Signed)
OUTPATIENT OCCUPATIONAL THERAPY NEURO TREATMENT   Patient Name: Dana [name] MRN: [id number] DOB:[birth year], 57 y.o., female Today's Date: 06/26/2023  PCP: Dr. Clent Ridges REFERRING PROVIDER: Dr. Gershon Crane  END OF SESSION:  OT End of Session - 06/26/23 0818     Visit Number 26    Number of Visits 40    Date for OT Re-Evaluation 07/08/23    Authorization Type UHC, Medicare    Authorization Time Period KX    Authorization - Visit Number 30    Progress Note Due on Visit 33    OT Start Time 0800    OT Stop Time 0840    OT Time Calculation (min) 40 min                        Past Medical History:  Diagnosis Date   Anxiety    Back pain with radiation    Breast mass, right    Chronic pain    Chronic, continuous use of opioids    Complication of anesthesia 04/07/14   Allergic reaction to Lisinopril immediately following surgery   DJD (degenerative joint disease)    Fibromyalgia    Groin abscess    Headache(784.0)    History of IBS    Hypercholesterolemia    IBS (irritable bowel syndrome)    Lactose intolerance    Mild hypertension    Obesity    Tobacco use disorder    Umbilical hernia    Symptomatic   Past Surgical History:  Procedure Laterality Date   ABDOMINAL HYSTERECTOMY     ANTERIOR CERVICAL DECOMP/DISCECTOMY FUSION N/A 12/20/2015   Procedure: ANTERIOR CERVICAL DECOMPRESSION FUSION CERVICAL 4-5, CERVICAL 5-6, CERVICAL 6-7 WITH INSTRUMENTATION AND ALLOGRAFT;  Surgeon: Estill Bamberg, MD;  Location: [facility] OR;  Service: Orthopedics;  Laterality: N/A;  Anterior cervical decompression fusion, cervical 4-5, cervical 5-6, cervical 6-7 with instrumentation and allograft   BACK SURGERY     BILATERAL SALPINGECTOMY  09/03/2012   Procedure: BILATERAL SALPINGECTOMY;  Surgeon: Ok Edwards, MD;  Location: [facility];  Service: Gynecology;  Laterality: Bilateral;   BREAST BIOPSY Right 04/07/2014   Procedure: REMOVAL RIGHT BREAST MASS WITH WIRE LOCALIZATION;  Surgeon: Adolph Pollack, MD;  Location: [facility] OR;  Service: General;  Laterality: Right;   BREAST EXCISIONAL BIOPSY Left    x2   BREAST LUMPECTOMY     x2   CARPAL TUNNEL RELEASE Left 05/11/2019   Procedure: LEFT CARPAL TUNNEL RELEASE, RIGHT TENNIS ELBOW MARCAINE/DEPO MEDROL INJECTION UNDER ANESTHESIA;  Surgeon: Kerrin Champagne, MD;  Location: [facility] OR;  Service: Orthopedics;  Laterality: Left;   COLONOSCOPY W/ BIOPSIES  04/24/2012   per Dr. Leone Payor, clear, repeat in 10 yrs    disectomy     ESOPHAGOGASTRODUODENOSCOPY     FINGER SURGERY     Right index-excision of mass    FOOT SURGERY Right    Bone Spurs   IR FLUORO GUIDE CV LINE RIGHT  01/29/2023   IR REMOVAL TUN CV CATH W/O FL  02/08/2023   IR US GUIDE VASC ACCESS RIGHT  01/31/2023   LAPAROSCOPIC HYSTERECTOMY  09/03/2012   Procedure: HYSTERECTOMY TOTAL LAPAROSCOPIC;  Surgeon: Ok Edwards, MD;  Location: [facility];  Service: Gynecology;  Laterality: N/A;   LUMBAR DISC SURGERY     TUBAL LIGATION     UMBILICAL HERNIA REPAIR N/A 05/01/2018   Procedure: UMBILICAL HERNIA REPAIR;  Surgeon: Jimmye Norman, MD;  Location: [facility] OR;  Service: General;  Laterality:  N/A;   Patient Active Problem List   Diagnosis Date Noted   CIDP (chronic inflammatory demyelinating polyneuropathy) (HCC) 01/28/2023   Gait abnormality 01/28/2023   Glaucoma 01/28/2023   Pain due to onychomycosis of toenails of both feet 01/21/2023   Wheelchair dependence 12/30/2022   Nerve pain 12/30/2022   Insomnia due to medical condition 12/30/2022   Acute inflammatory demyelinating polyneuropathy (HCC) 10/01/2022   UTI (urinary tract infection) 09/24/2022   Hypomagnesemia 09/24/2022   Weakness 09/23/2022   Hypokalemia 09/23/2022   B12 deficiency 09/05/2022   Folate deficiency 09/05/2022   Carpal tunnel syndrome, left upper limb 05/11/2019    Class: Chronic   Spinal stenosis of lumbar region with neurogenic claudication 08/20/2018   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   Primary  osteoarthritis of right knee 02/27/2017   Right tennis elbow 11/11/2016   Muscle cramps 01/15/2016   Bilateral leg edema 01/08/2016   Radiculopathy 12/20/2015   Hyperglycemia 12/21/2014   Mastodynia, female 11/30/2014   S/P excision of fibroadenoma of breast 11/30/2014   Angioedema of lips 04/07/2014   Fibroadenoma of right breast 03/07/2014   Pelvic pain in female 06/19/2012   Menorrhagia 06/11/2012   SUI (stress urinary incontinence, female) 06/11/2012   HTN (hypertension) 06/11/2012   IBS (irritable bowel syndrome) - diarrhea predominant 03/17/2012   MICROSCOPIC HEMATURIA 11/30/2010   BREAST PAIN, RIGHT 11/30/2010   BREAST MASS, RIGHT 01/12/2010   HYPERCHOLESTEROLEMIA 12/07/2007   CIGARETTE SMOKER 12/07/2007   Osteoarthritis 12/07/2007   Low back pain with sciatica 12/07/2007   Headache 12/07/2007   Obesity, Class III, BMI 40-49.9 (morbid obesity) (HCC) 08/03/2007   Anxiety state 08/03/2007    ONSET DATE: 12/05/22- referral date  REFERRING DIAG: G61.0 (ICD-10-CM) - Guillain Barr syndrome (HCC)  THERAPY DIAG:    Rationale for Evaluation and Treatment: Rehabilitation  SUBJECTIVE:   SUBJECTIVE STATEMENT: Pt reports she had a stomach bug and that's why she missed therapy PERTINENT HISTORY: Pt is a 57 y/o female hospitalized 09/23/22  with progressive weakness and sensation changes, consistent with Guillain-Barr syndrome. MRI negative. LP results and assessment most consistent with Guillain-Barr syndrome.  Pt was d/c home from CIR 11/06/22 PMH includes: glaucoma anxiety, DJD, fibromyalgia, HTN, tobacco use, B-12 deficiency, OA, ACDF 2017.  Pt was re-hospitalized 01/28/2456 with CIDP (chronic inflammatory demyelinating polyneuropathy). Saw Neuro on day of admission, NCS showed demyelination, referred to hospital for PLEX. Initially diagnosed with an acute demyelinating neuropathy Jan 2024, treated with IVIG. PMH: Spinal stenosis, HTN, Obesity   PRECAUTIONS: Fall  WEIGHT  BEARING RESTRICTIONS: no PAIN: no  A      FALLS: Has patient fallen in last 6 months? No  LIVING ENVIRONMENT: Lives with: lives with their spouse Lives in: House/apartment Stairs: has ramp built Has following equipment at home:   BSC, steadi,  tub bench but can't access bathroom   PLOF: Independent  PATIENT GOALS: increase independence   OBJECTIVE:   HAND DOMINANCE: Right  ADLs: from initial eval Overall ADLs: assistance required, unable to access BR Transfers/ambulation related to ADLs: Eating: unable to cut food Grooming: set up UB Dressing: set up for bra and shirt LB Dressing: max A Toileting: uses BSC uses steadi Bathing: min-mod A in hospital  bed  Tub Shower transfers: n/a Equipment: Emergency planning/management officer  IADLs:dependent for IADLS   MOBILITY STATUS:  uses w/c  , per PT report minguard for transfer from w/c to mat    ACTIVITY TOLERANCE: Activity tolerance: Pt fatigues quickly    UPPER EXTREMITY  ROM:  see updates for 02/18/23 re-eval   Active ROM Right 02/18/23 Left 02/18/23  Shoulder flexion 130 120  Shoulder abduction 90 90  Shoulder adduction    Shoulder extension    Shoulder internal rotation    Shoulder external rotation    Elbow flexion    Elbow extension  -5  Wrist flexion    Wrist extension    Wrist ulnar deviation    Wrist radial deviation    Wrist pronation    Wrist supination    (Blank rows = not tested)  UPPER EXTREMITY MMT:   see below for re-eval 02/18/23  MMT Right 02/18/23 Left 02/18/23  Shoulder flexion 3+/5 3+/5  Shoulder abduction    Shoulder adduction    Shoulder extension    Shoulder internal rotation    Shoulder external rotation    Middle trapezius    Lower trapezius    Elbow flexion 4/5 4/5  Elbow extension 4-/5 4-/5  Wrist flexion    Wrist extension    Wrist ulnar deviation    Wrist radial deviation    Wrist pronation    Wrist supination      HAND FUNCTION: Grip strength: Right: 32 lbs; Left: 18 lbs, 02/18/23  R 55 lbs, L 35 lbs 05/13/23- grip RUE 42, LUE 34  COORDINATION: 9 Hole Peg test:initial eval  Right: 1 min 48 sec; Left: 4 pegs in 2 mins  Box and Blocks:  Right 35 blocks, Left  26 blocks 02/18/23- RUE 35.46, LUE 65 secs  SENSATION: Light touch: WFL Hot/Cold: Not tested    COGNITION: Overall cognitive status:NT   VISION: Subjective report: reports hx of glaucoma VISION ASSESSMENT: Not tested  OBSERVATIONS: Pt is eager to improve, pt's husband is very supportive   TODAY'S TREATMENT:                                                                                                                              DATE:06/26/23-Arm bike x 6 mins level 1 for conditioning Pt ambulated w/c to arm bike 10' then pt later ambulated down the hall approximately 50 ft  Pt copied small peg design for small peg design with left and right UE's for increased fine motor coordination, mod difficulty with LUE, min difficulty with right. Grip R 40 lbs, L 30 lbs Graded clothespins  1-8# for sustained pinch for LUE mod difficulty, RUE min difficulty.  Pt was able to ambulate back to main gym with supervision approximately 40 ft  to first mat from OT office with RW and supervision   06/11/23- Arm bike x 6 mins for conditioning Pt ambulated with walker to mat 25' with RW and supervision Seated on edge of mat: biceps curls, shoulder flexion, chest press holding 1 lbs weight in each hand 2 sets of 10 reps for increased UE strength for ADLs. ( Pt then trialed 2 lbs weight, pt performed 3-5 reps of each, pt plans to order 1 and 2  lbs weights of the internet, pt was instructed that if she uses 2 lbs weight she should reduce reps)Pt then ambulated form mat to w/c grossly 25' with walker with supevision Fine motor coordination activity placing and removing graded pegs from peg board with LUE, min difficulty/ v.c   06/05/23-Pt reports being sick then having her IVIG treatment  Pt performed fine motor coordination  activity to to copy small peg design with LUE, min difficulty and increased time required, min v.c for design. Standing to place and remove graded clothespins 1-8# from vertical antennae with left and right UE , pt required 1 rest break due to fatigue. Red theraband exercises for increased strength and endurance for the following :abduction , biceps and triceps, 15 reps each  05/13/23 Pt ambulated to and from arm bike with cane and min a grossly 3 ft.  Arm bike x 6 mins level 3 for conditioning Therapist checked progress towards goals and discussed pt performance towards goals. Pt would like to continue therapy. Pt stood for 1 min 18 secs for functional reaching prior to rest break. Pt stood a second time for grossly 1 min prior to rest break. Frequent rest breaks are required due to pt's recent IVIG, and pt states she gets fatigued more quickly.   05/08/23- Pt ambulated grossly 3 ft with cane with minguard to and from to arm bike. Arm bike x 6 mins level 1 for conditioning Pt copied small peg design with left and right UE's for increased fine motor coordination, mod difficulty for LUE and drops, occasional min difficulty with RUE. Pt's LUE continues to fatigue quickly. Standing to perform functional reaching to retrieve items from chair to place on elevated surface with close supervision for standing balance and tolerance. Rest break required then pt was able to return items to lower surface with RUE with supervision. Pt continues to fatigue quickly and she stood for less than a minute.    05/01/23- Arm bike x 6 mins level 2 for conditioning, pt walked to and from arm bike with walker and supervision. Placing and removing grooved pegs from pegboard with LUE for increased fine motor coordination, removing with in hand manipulation, min difficulty/ drops and v.c Placing graded clothespins 1-8# on vertical antennae with LUE then removing with RUE for sustained pinch, min v.c  Gripper set at level 2,  pt picked up blocks with LUE for sustained grip, min difficulty/ drops   04/22/23:  This progress update covers dates of service from 6/4-04/22/23.   Started session working on LUE coordination.  Copying small peg patterns.  Patient with frequent drops, difficulty orienting small pegs in hand.  Patient with report of diminished sensation in left hand.  Attempted same task with left hand in glove with slightly increased ability initially - however - not able to sustain.   Worked on hand strengthening with gripper level 2 x 20 blocks.  Faciliatation an dcueing to reduce trunk lateral flexion and shoulder elevation.  Attempted 5 blocks at level 3 - fatigued after 3 blocks.   Standing balance/ tolerance at counter - working toward reducing UE reliance - reaching into chest high shelf, reaching to shelf at eye level.  Stand with walker and taking slow cautious steps to counter.    04/08/23-Pt ambulated approximately 5 feet to walk to arm bike with rolling walker with close supervision. Arm bike x 6 mins level 1 for conditioning Min a for sit to stand from armbike then pt ambulated back to w/c. Seated in  w/c closed chain shoulder flexion, with unweighted ball followed by shoulder flexion then trunk rotation and chest press wih 2.2 lbs weighted ball for UE strength and endurance, rest between sets. Fine motor coordination task to copy small peg design with LUE, min-mod difficulty, v.c to avoid shoulder hike/ compensation.  04/01/23- Seated closed chain shoulder flexion, with unweighted ball followed by shoulder flexion then trunk rotation and chest press wih 2.2 lbs weighted ball. Rest in between sets seated on mat. Pt walker to mat with walker and supervision. Standing at countertop functional reaching to place graded clothespins in targets with RUE, rest break then pt removed with left hand, close supervision. Discussed plans to continue OT after next week. Pt walked mat to w/c with walker and close supervision  .   03/27/23-Theraband exercises for biceps curls, triceps, rowing  and shoulder extension with green band and shoulder abduction with red band 10-15 reps each, min v.c, rest break in between sets Standing at countertop to place and remove items from lowest overhead shelf, seated rest break prior to replacing, close supervision for moblity with walker  Gripper set at level 2 sustained grip to pick up 1 inch blocks, 1/2 with RUE with min difficulty and 1/2 with left with min-mod difficulty for sustained grip Graded clothespins for sustained pinch 1-8# using left and right UE's for sustained pinch, RUE only with black clothespins dur to difficulty. Therapist recommends that pt orders a long handled sponge to apply lotion onto legs. Pt with continued bilateral LE swelling.  03/25/23 Pt is hoarse today and reports a tickling in her throat as well as LE swelling. Therapist recommends pt calls MD.Pt arrived late as her transportation did not pick her up. Arm bike x 6 mins level 1 for conditioning.Pt transferred to and from arm bike to w/c with min A. Copying small peg design for increased fine motor coordination with LUE, min difficulty and increased time, removing with in hand manipulation. Pt demonstrates improved fine motor coordination.  03/18/23- Pt amb across gym with RW and minguard to arm bike.  Arm bike x 7 mins level 1  for conditioning Placing small pegs into pegboard with LUE for increased fine motor coordination, pt with improved performance and coordination today. Pt walked 3 ft from chair to counter and stood to place and remove items from overhead shelves with 1 seated rest break. Discussion with patient regarding her home situation,. Pt reports frustration and she feels like she is a burden on husband. Therapist recommends pt seeks counseling. Shoulder abduction  with red band biceps curls with green band  20 reps for UE strengthening   03/13/23-Pt reports she had a stomach bug and she  wasn't feeling well. Placing large pegs into pegboard with LUE with occasional min difficulty, then removing with 3 pt pinch min v.c to avoid shoulder hike. Red theraband exercises for shoulder abduction, rowing and extension 15 reps each,  green band for biceps curls and triceps extension 15 reps each, min v.c Arm bike x 6 mins level 3 for conditioning, pt transfered to and from with min A Discussed safety for cooking and recommendation that if pt decides to fry fish, she performs with someone for safety, and she keeps her LUE away from hot surfaces due to sensory deficits. Pt verbalized understanding.   03/04/23 Red theraband exercises for shoulder abduction, rowing and extension 20 reps each, upgraded to green band for biceps curls and triceps extension 20 reps each, min v.c Arm bike x 8 mins,  4 mins each direction, level 2 for conditioning, pt transferred to and from seat with supervision. Coordination activities with LUE: flipping and dealing cards, min difficulty/ v.c  02/27/23-Arm bike x 6 mins level 2 for conditioning (Pt transfers to and from w/c with supervision) Placing large pegs into pegboard with LUE then removing for increased fine motor coordination, min difficulty/ v.c  Standing at countertop with supervision to place and remove items with right then left UE's, pt stood x 3 for 30 secs to 1 min 15 secs, rest breaks in between Gripper set at level 1 to pick up 1 inch blocks  with right and left UE's for sustained grip, min difficulty Pt reports amb to BR with walker without assist, therapist recommends supervision for safety.  02/25/23-Arm bike x 6 mins level 2 for conditioning, Pt transferred to the seat for the first time with supervision Reviewed previously issued red theraband exercises for UE strength 20 reps each, min v.c  Fine motor coordination exercises with left and right UE's, flipping playing cards and stacking coins with min difficulty using RUE, picking up coins and  placing in container with LUE and flipping cards mod difficulty. Pt attempted placing small pegs into a pegboard with bilateral UE's max difficulty with LUE, mod difficulty with RUE, task was discontinued. Pt reports being a little off. She took her meds without eating and got into a disagreement with transportation this a.m.    02/18/23- re-eval performed and therapist discussed potential goals with patient.  PATIENT EDUCATION: Education details: see above Person educated: Patient Education method: explanation, demonstration Education comprehension: verbalized understanding, returned demonstration.  HOME EXERCISE PROGRAM:  12/17/22- coordination HEP, cane exercises seated   GOALS: Goals reviewed with patient? Yes  SHORT TERM GOALS: Target date: 06/12/23  I with initial HEP Baseline: Goal status: met 01/21/23  2.  Pt will demonstrate improved UE functional use as evidenced increasing bilateral box/ blocks score by 4 blocks.  Baseline: R 35, L 26 Goal status  met 12/31/22 RUE :43:  LUE 35    3.  Pt will donn pants with mod A. Baseline: max A Goal status:met  02/18/23- pt performed mod I today  4. Pt will verbalize understanding of adapted strategies to maximize safety and I with ADLs/ IADLs .  Goal status:  ongoing, has tub bench and long handled sponge  8/6  ongoing - patient relies on husband to lift legs into tub - encouraging patient to increase participation with IADL at home.    5.  Pt will demonstrate ability to stand for functional activity with min A x 10 mins in prep for ADLs.  Revised short term goal 05/13/23 -Pt will stand for 5 mins prior to rest break in prep for ADLS.  Goal status: ongoing pt stood for 1 min on 02/18/23 8/6:  Stood x 2 min in clinic - patient reporting increased fatigue due to IVIG treatment.  05/13/23-Stood 1 min 18 secs  6.  Pt will perform w/c to [facility] transfers mod I-  mod I per pt report 05/08/23  Goal status:  met, 02/18/23 7. Pt will increase  bilateral grip strength by 5 lbs for increased functional use of UE's  Baseline 05/13/23-RUE 42, LUE 34  Goal status: new     LONG TERM GOALS: Target date: 07/08/23 I with updated HEP  Goal status: ongoing, 05/13/23  2.  Pt will perform LB dressing mod I. Baseline:max A Goal status:met 03/25/23- 8/6- patient indicates she is dressing herself.  3.  Pt will bathe with supervision/ set up. Baseline: min-mod A for sponge bath Goal status: duplicate goal, therefore deferred,  see below, pt met for sponge bathing.  4.  Pt will perform basic cooking at a w/c level mod I  Revised goal: Pt will perform basic cooking modified independently at a walker level.  Goal status:  Ongoing, pt is currently performing at a w/c level 05/13/23, not walking in kitchen  5.  Pt will demonstrate improved fine motor coordination as evidenced by performing LUE 9 hole peg test in 2 mins or less. - initial goal met Revised goal:Pt will demonstrate improved fine motor coordination as evidenced by performing 9 hole peg test in 55 secs or less  8/6- 63 seconds- ongoing  Goal status: ongoing- 1 min 17 secs on 05/13/23  6.  Pt will demonstrate improved fine motor coordination as evidenced by performing RUE 9 hole peg test in 1 mins  30 secs or less.  Baseline: RUE  1 min 48 secs, LUE 4 pegs placed in 2 mins  Goal status: met 6/4/246/4/24- RUE 35.46,     7. Pt will perform shower with supervision using tub transfer bench Goal status: ongoing,  partially met pt has assist with lifting legs into tub, then she completes bathing at a supervision level, has assist with drying 05/13/23    8. Pt will perform basic home management at a walker level mod I    Goal status: ongoing, 05/13/23- still performing at w/c level, not consistent    CLINICAL IMPRESSION: Pt is progressing towards goals. She has not been seen for several weeks due to IVIG treatment and stomach virus. Pt demonstrates improving functional mobility for  ADLs. PERFORMANCE DEFICITS: in functional skills including ADLs, IADLs, coordination, dexterity, sensation, ROM, strength, flexibility, Fine motor control, Gross motor control, mobility, balance, endurance, decreased knowledge of precautions, decreased knowledge of use of DME, and UE functional use, cognitive skills including  and psychosocial skills including coping strategies, environmental adaptation, habits, interpersonal interactions, and routines and behaviors.   IMPAIRMENTS: are limiting patient from ADLs, IADLs, rest and sleep, play, leisure, and social participation.   CO-MORBIDITIES: may have co-morbidities  that affects occupational performance. Patient will benefit from skilled OT to address above impairments and improve overall function.  MODIFICATION OR ASSISTANCE TO COMPLETE EVALUATION: No modification of tasks or assist necessary to complete an evaluation.  OT OCCUPATIONAL PROFILE AND HISTORY: Detailed assessment: Review of records and additional review of physical, cognitive, psychosocial history related to current functional performance.  CLINICAL DECISION MAKING: LOW - limited treatment options, no task modification necessary  REHAB POTENTIAL: Good  EVALUATION COMPLEXITY: Low    PLAN:  OT FREQUENCY: 2x/week  OT DURATION: 8 weeks  PLANNED INTERVENTIONS: self care/ADL training, therapeutic exercise, therapeutic activity, neuromuscular re-education, manual therapy, passive range of motion, gait training, balance training, stair training, functional mobility training, aquatic therapy, ultrasound, paraffin, moist heat, cryotherapy, patient/family education, energy conservation, coping strategies training, DME and/or AE instructions, and Re-evaluation  RECOMMENDED OTHER SERVICES: PT  CONSULTED AND AGREED WITH PLAN OF CARE: Patient and family member/caregiver  PLAN FOR NEXT SESSION: continue to work towards updated goals, coordination, standing tolerance for  ADLs.  Amjad Fikes, OT 06/26/2023, 8:23 AM          3

## 2023-07-01 ENCOUNTER — Encounter: Payer: Self-pay | Admitting: Physical Therapy

## 2023-07-01 ENCOUNTER — Encounter: Payer: Self-pay | Admitting: Occupational Therapy

## 2023-07-01 ENCOUNTER — Ambulatory Visit: Payer: 59 | Admitting: Physical Therapy

## 2023-07-01 ENCOUNTER — Ambulatory Visit: Payer: 59 | Admitting: Occupational Therapy

## 2023-07-01 VITALS — HR 95

## 2023-07-01 DIAGNOSIS — M6281 Muscle weakness (generalized): Secondary | ICD-10-CM

## 2023-07-01 DIAGNOSIS — R2689 Other abnormalities of gait and mobility: Secondary | ICD-10-CM

## 2023-07-01 DIAGNOSIS — R278 Other lack of coordination: Secondary | ICD-10-CM

## 2023-07-01 DIAGNOSIS — R2681 Unsteadiness on feet: Secondary | ICD-10-CM

## 2023-07-01 DIAGNOSIS — R262 Difficulty in walking, not elsewhere classified: Secondary | ICD-10-CM

## 2023-07-01 DIAGNOSIS — G6181 Chronic inflammatory demyelinating polyneuritis: Secondary | ICD-10-CM

## 2023-07-01 NOTE — Therapy (Signed)
OUTPATIENT PHYSICAL THERAPY LOWER EXTREMITY TREATMENT   Patient Name: Dana [name] MRN: [id number] DOB:[birth year], 57 y.o., female Today's Date: 07/01/2023  END OF SESSION:  PT End of Session - 07/01/23 1016     Visit Number 25    Date for PT Re-Evaluation 07/03/23    PT Start Time 1016    PT Stop Time 1055    PT Time Calculation (min) 39 min    Equipment Utilized During Treatment Gait belt    Activity Tolerance Patient tolerated treatment well    Behavior During Therapy WFL for tasks assessed/performed            Past Medical History:  Diagnosis Date   Anxiety    Back pain with radiation    Breast mass, right    Chronic pain    Chronic, continuous use of opioids    Complication of anesthesia 04/07/14   Allergic reaction to Lisinopril immediately following surgery   DJD (degenerative joint disease)    Fibromyalgia    Groin abscess    Headache(784.0)    History of IBS    Hypercholesterolemia    IBS (irritable bowel syndrome)    Lactose intolerance    Mild hypertension    Obesity    Tobacco use disorder    Umbilical hernia    Symptomatic   Past Surgical History:  Procedure Laterality Date   ABDOMINAL HYSTERECTOMY     ANTERIOR CERVICAL DECOMP/DISCECTOMY FUSION N/A 12/20/2015   Procedure: ANTERIOR CERVICAL DECOMPRESSION FUSION CERVICAL 4-5, CERVICAL 5-6, CERVICAL 6-7 WITH INSTRUMENTATION AND ALLOGRAFT;  Surgeon: Estill Bamberg, MD;  Location: [facility] OR;  Service: Orthopedics;  Laterality: N/A;  Anterior cervical decompression fusion, cervical 4-5, cervical 5-6, cervical 6-7 with instrumentation and allograft   BACK SURGERY     BILATERAL SALPINGECTOMY  09/03/2012   Procedure: BILATERAL SALPINGECTOMY;  Surgeon: Ok Edwards, MD;  Location: [facility];  Service: Gynecology;  Laterality: Bilateral;   BREAST BIOPSY Right 04/07/2014   Procedure: REMOVAL RIGHT BREAST MASS WITH WIRE LOCALIZATION;  Surgeon: Adolph Pollack, MD;  Location[facility] OR;  Service: General;   Laterality: Right;   BREAST EXCISIONAL BIOPSY Left    x2   BREAST LUMPECTOMY     x2   CARPAL TUNNEL RELEASE Left 05/11/2019   Procedure: LEFT CARPAL TUNNEL RELEASE, RIGHT TENNIS ELBOW MARCAINE/DEPO MEDROL INJECTION UNDER ANESTHESIA;  Surgeon: Kerrin Champagne, MD;  Location: [facility] OR;  Service: Orthopedics;  Laterality: Left;   COLONOSCOPY W/ BIOPSIES  04/24/2012   per Dr. Leone Payor, clear, repeat in 10 yrs    disectomy     ESOPHAGOGASTRODUODENOSCOPY     FINGER SURGERY     Right index-excision of mass    FOOT SURGERY Right    Bone Spurs   IR FLUORO GUIDE CV LINE RIGHT  01/29/2023   IR REMOVAL TUN CV CATH W/O FL  02/08/2023   IR US GUIDE VASC ACCESS RIGHT  01/31/2023   LAPAROSCOPIC HYSTERECTOMY  09/03/2012   Procedure: HYSTERECTOMY TOTAL LAPAROSCOPIC;  Surgeon: Ok Edwards, MD;  Location: [facility];  Service: Gynecology;  Laterality: N/A;   LUMBAR DISC SURGERY     TUBAL LIGATION     UMBILICAL HERNIA REPAIR N/A 05/01/2018   Procedure: UMBILICAL HERNIA REPAIR;  Surgeon: Jimmye Norman, MD;  Location: [facility] OR;  Service: General;  Laterality: N/A;   Patient Active Problem List   Diagnosis Date Noted   CIDP (chronic inflammatory demyelinating polyneuropathy) (HCC) 01/28/2023   Gait abnormality 01/28/2023   Glaucoma 01/28/2023  Pain due to onychomycosis of toenails of both feet 01/21/2023   Wheelchair dependence 12/30/2022   Nerve pain 12/30/2022   Insomnia due to medical condition 12/30/2022   Acute inflammatory demyelinating polyneuropathy (HCC) 10/01/2022   UTI (urinary tract infection) 09/24/2022   Hypomagnesemia 09/24/2022   Weakness 09/23/2022   Hypokalemia 09/23/2022   B12 deficiency 09/05/2022   Folate deficiency 09/05/2022   Carpal tunnel syndrome, left upper limb 05/11/2019    Class: Chronic   Spinal stenosis of lumbar region with neurogenic claudication 08/20/2018   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   Primary osteoarthritis of right knee 02/27/2017   Right tennis elbow  11/11/2016   Muscle cramps 01/15/2016   Bilateral leg edema 01/08/2016   Radiculopathy 12/20/2015   Hyperglycemia 12/21/2014   Mastodynia, female 11/30/2014   S/P excision of fibroadenoma of breast 11/30/2014   Angioedema of lips 04/07/2014   Fibroadenoma of right breast 03/07/2014   Pelvic pain in female 06/19/2012   Menorrhagia 06/11/2012   SUI (stress urinary incontinence, female) 06/11/2012   HTN (hypertension) 06/11/2012   IBS (irritable bowel syndrome) - diarrhea predominant 03/17/2012   MICROSCOPIC HEMATURIA 11/30/2010   BREAST PAIN, RIGHT 11/30/2010   BREAST MASS, RIGHT 01/12/2010   HYPERCHOLESTEROLEMIA 12/07/2007   CIGARETTE SMOKER 12/07/2007   Osteoarthritis 12/07/2007   Low back pain with sciatica 12/07/2007   Headache 12/07/2007   Obesity, Class III, BMI 40-49.9 (morbid obesity) (HCC) 08/03/2007   Anxiety state 08/03/2007    PCP: Nelwyn Salisbury, MD  REFERRING PROVIDER: Nelwyn Salisbury, MD  REFERRING DIAG: G61.0 (ICD-10-CM) - Guillain Barr syndrome [facility])   THERAPY DIAG:  Muscle weakness (generalized)  Other lack of coordination  Unsteadiness on feet  Other abnormalities of gait and mobility  CIDP (chronic inflammatory demyelinating polyneuropathy) (HCC)  Difficulty in walking, not elsewhere classified  Rationale for Evaluation and Treatment: Rehabilitation  ONSET DATE: 12/05/22  SUBJECTIVE:   SUBJECTIVE STATEMENT: Patient reports no new issues. She reports rapid HR with activity and feels hot. She feels Like when she reaches her destination she is out of breath. The distance she walked doesn't appear to make much difference. It has been happening ever since she got home from the hospital. It has been about the same the whole time.  OBJECTIVE STATEMENT: 01/28/23 ASSESSMENT AND PLAN   Dana [name] is a 57 y.o. female   Demyelinating polyradiculoneuropathy             Symptom onset monophasic since September 2024, failed to respond to IVIG in  January 2024,             EMG nerve conduction study showed demyelinating features, no evidence of axonal loss,             Talk with neurohospitalist Dr. Iver Nestle, ED triage, will send her for hospital admission for plasma exchange, please add on following labs, immunofixative protein electrophoresis, ANA with reflex, SSA, SSB titer, iron panel             Starting outpatient IVIG prior authorization process,  MARLESHA SNIFFEN is a 57 y.o. female who [facility] the [facility] ED on 09/23/2022 with bilateral lower extremity weakness with decreased mobility that has progressed to both arms. She also reported numbness and tingling of the extremities. She was transferred to [facility] for MRI. 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  Inpatient Rehab F/B HHPT.  R knee injection 10/08/22 trigger point injections for worsening back pain 2/15  RA  PAIN:  Are you having pain? Yes: NPRS scale: up to 10/10 Pain location: all over, but her R knee pain is worst,  Pain description: Sharp Aggravating factors: Has difficulty standing up Relieving factors: Medicine helps somewhat  PRECAUTIONS: Fall  WEIGHT BEARING RESTRICTIONS: No  FALLS:  Has patient fallen in last 6 months? No  LIVING ENVIRONMENT: Lives with: lives with their family Lives in: House/apartment Stairs: Nohas a ramp Has following equipment at home: Environmental consultant - 2 wheeled, Wheelchair (power), Tour manager, bed side commode, Ramped entry, and [employer] lift  OCCUPATION: on disability due to RA and a fall several years ago  PLOF: Independent  PATIENT GOALS: walk  NEXT MD VISIT: She is scheduled to follow up with Dr. Serita Sheller with Physical Med/Rehab on 12-30-22. She is scheduled to follow up with Dr. Levert Feinstein at Neurology on 01-28-23.   OBJECTIVE:   DIAGNOSTIC FINDINGS:  Right knee radiographs 11/09/2020   FINDINGS: Severe patellofemoral joint space narrowing. Severe superior and mild inferior patellar degenerative  osteophytosis. Moderate superior trochlear degenerative osteophytosis. No joint effusion. Severe medial compartment joint space narrowing with moderate peripheral medial and lateral compartment degenerative osteophytes.  COGNITION: Overall cognitive status: Within functional limits for tasks assessed     SENSATION: Light touch: Impaired  and B hands and feet and lower legs  EDEMA:  BLE edema noted.  MUSCLE LENGTH: Hamstrings: WFL Thomas test: B tightness, to neutral  POSTURE: rounded shoulders and flexed trunk   PALPATION: No TTP  LOWER EXTREMITY ROM: BLE ROM limited in all planes an all joints due to body habitus   LOWER EXTREMITY MMT:  MMT Right eval Left eval R 05/01/23 L 05/01/23 R/L 06/05/23  Hip flexion 3 3- 4- 3+ 4/4  Hip extension 3- 3- 3 3 3   Hip abduction 3 3- 3+ 3+ 4-/3+  Hip adduction       Hip internal rotation       Hip external rotation       Knee flexion 4 4- 4 4 4+/4+  Knee extension 4- 4- 4 4 4+/4+  Ankle dorsiflexion 4- 4- 4 4 4/4  Ankle plantarflexion       Ankle inversion 4- 4-     Ankle eversion 4- 4-      (Blank rows = not tested)  FUNCTIONAL TESTS:  5 times sit to stand: unable to complete Timed up and go (TUG): unable to complete,  04/22/23  TUG 63 seconds with FWW  Bed Mobility: Occasional min A for sup <> sit, rolled B on mat with great effort, light min A  TRANSFERS: Scooting transfer with CGA, stand pivot with CGA, she reports that she sometimes has difficulty rising due to her knee pain  GAIT: Distance walked: 0' Assistive device utilized:  shopping cart-will bring her RW from home. Level of assistance: Min A Comments: Stood and weight shifted, then stood again and took steps to turn to W/C.  TODAY'S TREATMENT:  DATE:  07/01/23 NuStep L5 x 6 minutes- attempted bike, but she reported severe calf  pain. Activity stress test performed due to patient's reports of severe SOB and racing heart and overheating with gait activities.  NuStep, progressive WATTS. HR, SATS, RPE assessed each 2 minutes. Rest- SATS/HR/RPE 100/91 2 min at 25-30 W 98/10/3 2 min at 35-40 W 98/111/5 2 min at 35-40 W 97/114/10 stopped Ambulation x 25' with RW SATS/HR 100/117  06/26/23 NuStep L4 x 6 minutes Alternately tapping 6" step with BUE support on RW, attempting to decrease reliance on UE, 15 each leg, more challenging standing on R. Standing B weight shifts over BOS, then quick step taps to each side. Performed balance training- Step forward with RLE, reach to pick up cone, then step back and turn R leg out, with weight shift back onto the R leg, reaching to place on mat. Repeated x 6 each direction, no UE support. B side stepping on airex pad in parallel bars x 1, challenging.  06/05/23 NuStep L5 x 6 minutes  05/13/23 NuStep L5 x 6 minutes Stand pivot transfers to and from the NuStep using cane with multiple small steps each time. Standing mini squats while holding 2# ball at chest, 2 x 5 reps Seated rotational walk outs, with opposite leg ext, 5 reps to each side Side step x 5 feet, using SPC and HHA,   05/08/23 NuStep L5 x 4 minutes, then 3 x 30 sec with increased resistance, L7 B side stepping in front of mat using SPC, CGA, 7 steps each way B lateral lunge/return x 8 each side, SPC Stepped to W/C with SPC x 5', CGA  05/01/23 NuStep L5 x 5 min then 2 x 30 sec at L7 for power. Strength re-assessment for PN Bed mob MI, Transfers MI Ambulated 1 x 40' with RW and S, improved stability.  04/22/23 Nustep level 3 x 8 minutes TUG 63 seconds with FWW Leg curls 20# Seated 5# rows and 5# extensions with pulleys Ball b/n knees squeeze Green tband clamshells Seated marches 3x10 3# LAQ 3x10 Ball in lap isometric abs Walking with CGA using FWW x 50 feet  04/08/23 NuStep L3 x 6 minutes Standing on Airex  in parallel bars, performed B and ant/post weight shifts over feet, hands hovering over the bar. Stable. B side stepping in parallel bars with Red Tband positioned just above knees to add resistance to hip abd. 2 x 5 each way. Seated W/C push ups x 8 Seated reaching to each side, outside BOS x 5 each.  04/01/23 Nustep level 5 x 8 minutes TUG 65 seconds with FWW and SBA Seated ball b/n knees squeeze Red tband left hip adduction to isolate Seated marches In [employer], hip abduction and extension, some right knee pain with right leg extension In pbars practicing big steps In pbars sides stepping Pball isometric abs Leg curls 20# 2x10 Leg extension small ROM 5# 2x10 Ambulate FWW CGA 2x 20'  PATIENT EDUCATION:  Education details: POC Person educated: Patient Education method: Explanation Education comprehension: verbalized understanding  HOME EXERCISE PROGRAM: [id number]  ASSESSMENT:  CLINICAL IMPRESSION: Patient reports racing heart, overheating, SOB after ambulation. She says it does not matter if the distance is short or long. Assessed activity tolerance as noted in Objective. Her HR remained regular throughout, rose appropriately with increased stress, but did take a long time to return to < 97. She does appear to rely heavily on arms and tense her upper body with gait, sot  practiced and encouraged less reliance on UE. She can actually take a few steps without any UE support. Patient educated to findings, still concerned, so encouraged to share her concern with her Dr.  Dione Housekeeper IMPAIRMENTS: Abnormal gait, decreased activity tolerance, decreased balance, decreased coordination, decreased endurance, difficulty walking, decreased ROM, decreased strength, and postural dysfunction.   ACTIVITY LIMITATIONS: carrying, lifting, bending, standing, squatting, stairs, transfers, and locomotion level  PARTICIPATION LIMITATIONS: meal prep, cleaning, laundry, driving, and shopping  PERSONAL  FACTORS: Fitness, Past/current experiences, and 1 comorbidity: Recently diagnosed with GBS  are also affecting patient's functional outcome.   REHAB POTENTIAL: Good  CLINICAL DECISION MAKING: Evolving/moderate complexity  EVALUATION COMPLEXITY: Moderate  GOALS: Goals reviewed with patient? Yes  SHORT TERM GOALS: Target date: 11/28/22 I with initial HEP Baseline: Goal status: met 01/09/23  LONG TERM GOALS: Target date: 03/06/23  I with final HEP Baseline:  Goal status: 06/05/23, ongoing  2.  Decrease 5x STS to < 14 sec Baseline: unable to complete Goal status: 04/22/23-32 sec, 06/05/23-15 sec, ongoing  3.  Decrease TUG to < 20 sec Baseline: Unable to complete. Goal status: 02/18/23 1:55 04/01/23 = 65 seconds 04/22/23 63 seconds, 06/05/23-41 sec, ongoing  4.  Patient will ambulate at least 300' with LRAD, MI, on level and unlevel surfaces. Baseline: Stand pivot transfer with CGA Goal status: 02/18/23-20', RW, CGA, slow, but stable throughout. 06/05/23-100', RW, ongoing  5.  Perform all bed mobility with I Baseline: Occ min A on mat Goal status: 02/18/23-met  6.  Increase BLE strength to at least 4/5 throughout. Baseline: (3-)-3/5 Goal status: 06/05/23-See MMT, ongoing.  7. Patient and husband will safely perform car transfers with LRAD Baseline: Currently uses van transportation Goal Status: 02/25/23 met  PLAN:  PT FREQUENCY: 1-2x/week  PT DURATION: 12 weeks  PLANNED INTERVENTIONS: Therapeutic exercises, Therapeutic activity, Neuromuscular re-education, Balance training, Gait training, Patient/Family education, Self Care, Joint mobilization, Stair training, Dry Needling, Electrical stimulation, Cryotherapy, Moist heat, Ultrasound, Ionotophoresis 4mg /ml Dexamethasone, and Manual therapy  PLAN FOR NEXT SESSION: Would like to continue to push her strength, gait and overall function.    Oley Balm DPT 07/01/23 11:52 AM

## 2023-07-01 NOTE — Therapy (Signed)
OUTPATIENT OCCUPATIONAL THERAPY NEURO TREATMENT   Patient Name: Dana [name] MRN: [id number] DOB:1966-03-16, 57 y.o., female Today's Date: 07/01/2023  PCP: Dr. Clent Ridges REFERRING PROVIDER: Dr. Gershon Crane  END OF SESSION:  OT End of Session - 07/01/23 0942     Visit Number 27    Number of Visits 40    Date for OT Re-Evaluation 07/08/23    Authorization Type UHC, Medicare    Authorization Time Period KX                        Past Medical History:  Diagnosis Date   Anxiety    Back pain with radiation    Breast mass, right    Chronic pain    Chronic, continuous use of opioids    Complication of anesthesia 04/07/14   Allergic reaction to Lisinopril immediately following surgery   DJD (degenerative joint disease)    Fibromyalgia    Groin abscess    Headache(784.0)    History of IBS    Hypercholesterolemia    IBS (irritable bowel syndrome)    Lactose intolerance    Mild hypertension    Obesity    Tobacco use disorder    Umbilical hernia    Symptomatic   Past Surgical History:  Procedure Laterality Date   ABDOMINAL HYSTERECTOMY     ANTERIOR CERVICAL DECOMP/DISCECTOMY FUSION N/A 12/20/2015   Procedure: ANTERIOR CERVICAL DECOMPRESSION FUSION CERVICAL 4-5, CERVICAL 5-6, CERVICAL 6-7 WITH INSTRUMENTATION AND ALLOGRAFT;  Surgeon: Estill Bamberg, MD;  Location: [facility] OR;  Service: Orthopedics;  Laterality: N/A;  Anterior cervical decompression fusion, cervical 4-5, cervical 5-6, cervical 6-7 with instrumentation and allograft   BACK SURGERY     BILATERAL SALPINGECTOMY  09/03/2012   Procedure: BILATERAL SALPINGECTOMY;  Surgeon: Ok Edwards, MD;  Location: [facility];  Service: Gynecology;  Laterality: Bilateral;   BREAST BIOPSY Right 04/07/2014   Procedure: REMOVAL RIGHT BREAST MASS WITH WIRE LOCALIZATION;  Surgeon: Adolph Pollack, MD;  Location: [facility];  Service: General;  Laterality: Right;   BREAST EXCISIONAL BIOPSY Left    x2   BREAST LUMPECTOMY     x2    CARPAL TUNNEL RELEASE Left 05/11/2019   Procedure: LEFT CARPAL TUNNEL RELEASE, RIGHT TENNIS ELBOW MARCAINE/DEPO MEDROL INJECTION UNDER ANESTHESIA;  Surgeon: Kerrin Champagne, MD;  Location: [facility];  Service: Orthopedics;  Laterality: Left;   COLONOSCOPY W/ BIOPSIES  04/24/2012   per Dr. Leone Payor, clear, repeat in 10 yrs    disectomy     ESOPHAGOGASTRODUODENOSCOPY     FINGER SURGERY     Right index-excision of mass    FOOT SURGERY Right    Bone Spurs   IR FLUORO GUIDE CV LINE RIGHT  01/29/2023   IR REMOVAL TUN CV CATH W/O FL  02/08/2023   IR US GUIDE VASC ACCESS RIGHT  01/31/2023   LAPAROSCOPIC HYSTERECTOMY  09/03/2012   Procedure: HYSTERECTOMY TOTAL LAPAROSCOPIC;  Surgeon: Ok Edwards, MD;  Location: [facility];  Service: Gynecology;  Laterality: N/A;   LUMBAR DISC SURGERY     TUBAL LIGATION     UMBILICAL HERNIA REPAIR N/A 05/01/2018   Procedure: UMBILICAL HERNIA REPAIR;  Surgeon: Jimmye Norman, MD;  Location: [facility] OR;  Service: General;  Laterality: N/A;   Patient Active Problem List   Diagnosis Date Noted   CIDP (chronic inflammatory demyelinating polyneuropathy) (HCC) 01/28/2023   Gait abnormality 01/28/2023   Glaucoma 01/28/2023   Pain due to onychomycosis of toenails of both  feet 01/21/2023   Wheelchair dependence 12/30/2022   Nerve pain 12/30/2022   Insomnia due to medical condition 12/30/2022   Acute inflammatory demyelinating polyneuropathy (HCC) 10/01/2022   UTI (urinary tract infection) 09/24/2022   Hypomagnesemia 09/24/2022   Weakness 09/23/2022   Hypokalemia 09/23/2022   B12 deficiency 09/05/2022   Folate deficiency 09/05/2022   Carpal tunnel syndrome, left upper limb 05/11/2019    Class: Chronic   Spinal stenosis of lumbar region with neurogenic claudication 08/20/2018   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   Primary osteoarthritis of right knee 02/27/2017   Right tennis elbow 11/11/2016   Muscle cramps 01/15/2016   Bilateral leg edema 01/08/2016   Radiculopathy  12/20/2015   Hyperglycemia 12/21/2014   Mastodynia, female 11/30/2014   S/P excision of fibroadenoma of breast 11/30/2014   Angioedema of lips 04/07/2014   Fibroadenoma of right breast 03/07/2014   Pelvic pain in female 06/19/2012   Menorrhagia 06/11/2012   SUI (stress urinary incontinence, female) 06/11/2012   HTN (hypertension) 06/11/2012   IBS (irritable bowel syndrome) - diarrhea predominant 03/17/2012   MICROSCOPIC HEMATURIA 11/30/2010   BREAST PAIN, RIGHT 11/30/2010   BREAST MASS, RIGHT 01/12/2010   HYPERCHOLESTEROLEMIA 12/07/2007   CIGARETTE SMOKER 12/07/2007   Osteoarthritis 12/07/2007   Low back pain with sciatica 12/07/2007   Headache 12/07/2007   Obesity, Class III, BMI 40-49.9 (morbid obesity) (HCC) 08/03/2007   Anxiety state 08/03/2007    ONSET DATE: 12/05/22- referral date  REFERRING DIAG: G61.0 (ICD-10-CM) - Guillain Barr syndrome (HCC)  THERAPY DIAG:    Rationale for Evaluation and Treatment: Rehabilitation  SUBJECTIVE:   SUBJECTIVE STATEMENT: Pt reports she had a stomach bug and that's why she missed therapy PERTINENT HISTORY: Pt is a 57 y/o female hospitalized 09/23/22  with progressive weakness and sensation changes, consistent with Guillain-Barr syndrome. MRI negative. LP results and assessment most consistent with Guillain-Barr syndrome.  Pt was d/c home from CIR 11/06/22 PMH includes: glaucoma anxiety, DJD, fibromyalgia, HTN, tobacco use, B-12 deficiency, OA, ACDF 2017.  Pt was re-hospitalized 01/28/2456 with CIDP (chronic inflammatory demyelinating polyneuropathy). Saw Neuro on day of admission, NCS showed demyelination, referred to hospital for PLEX. Initially diagnosed with an acute demyelinating neuropathy Jan 2024, treated with IVIG. PMH: Spinal stenosis, HTN, Obesity   PRECAUTIONS: Fall  WEIGHT BEARING RESTRICTIONS: no PAIN: no  A      FALLS: Has patient fallen in last 6 months? No  LIVING ENVIRONMENT: Lives with: lives with their  spouse Lives in: House/apartment Stairs: has ramp built Has following equipment at home:   BSC, steadi,  tub bench but can't access bathroom   PLOF: Independent  PATIENT GOALS: increase independence   OBJECTIVE:   HAND DOMINANCE: Right  ADLs: from initial eval Overall ADLs: assistance required, unable to access BR Transfers/ambulation related to ADLs: Eating: unable to cut food Grooming: set up UB Dressing: set up for bra and shirt LB Dressing: max A Toileting: uses BSC uses steadi Bathing: min-mod A in hospital  bed  Tub Shower transfers: n/a Equipment: Emergency planning/management officer  IADLs:dependent for IADLS   MOBILITY STATUS:  uses w/c  , per PT report minguard for transfer from w/c to mat    ACTIVITY TOLERANCE: Activity tolerance: Pt fatigues quickly    UPPER EXTREMITY ROM:  see updates for 02/18/23 re-eval   Active ROM Right 02/18/23 Left 02/18/23  Shoulder flexion 130 120  Shoulder abduction 90 90  Shoulder adduction    Shoulder extension    Shoulder internal rotation  Shoulder external rotation    Elbow flexion    Elbow extension  -5  Wrist flexion    Wrist extension    Wrist ulnar deviation    Wrist radial deviation    Wrist pronation    Wrist supination    (Blank rows = not tested)  UPPER EXTREMITY MMT:   see below for re-eval 02/18/23  MMT Right 02/18/23 Left 02/18/23  Shoulder flexion 3+/5 3+/5  Shoulder abduction    Shoulder adduction    Shoulder extension    Shoulder internal rotation    Shoulder external rotation    Middle trapezius    Lower trapezius    Elbow flexion 4/5 4/5  Elbow extension 4-/5 4-/5  Wrist flexion    Wrist extension    Wrist ulnar deviation    Wrist radial deviation    Wrist pronation    Wrist supination      HAND FUNCTION: Grip strength: Right: 32 lbs; Left: 18 lbs, 02/18/23 R 55 lbs, L 35 lbs 05/13/23- grip RUE 42, LUE 34  COORDINATION: 9 Hole Peg test:initial eval  Right: 1 min 48 sec; Left: 4 pegs in 2 mins  Box and  Blocks:  Right 35 blocks, Left  26 blocks 02/18/23- RUE 35.46, LUE 65 secs  SENSATION: Light touch: WFL Hot/Cold: Not tested    COGNITION: Overall cognitive status:NT   VISION: Subjective report: reports hx of glaucoma VISION ASSESSMENT: Not tested  OBSERVATIONS: Pt is eager to improve, pt's husband is very supportive   TODAY'S TREATMENT:                                                                                                                              DATE:07/01/23- Arm bike x 6 mins level 1  Pt ambulated with supervision from arm bike to OT therapy room approximately 68ft with RW for endurance for ADLs Pt reported SOB after amb. HR 107, O2 sats 100% Pt rested for 5 mins and HR=95% Placing grooved pegs into pegboard with LUE for increased fine motor coordination, increased time and min-mod difficulty Gripper set at level 2 for sustained grip to pick up 1 inch blocks, with right and left UE's min difficulty/ drops.  06/26/23-Arm bike x 6 mins level 1 for conditioning Pt ambulated w/c to arm bike 10' then pt later ambulated down the hall approximately 50 ft  Pt copied small peg design for small peg design with left and right UE's for increased fine motor coordination, mod difficulty with LUE, min difficulty with right. Grip R 40 lbs, L 30 lbs Graded clothespins  1-8# for sustained pinch for LUE mod difficulty, RUE min difficulty.  Pt was able to ambulate back to main gym with supervision approximately 40 ft  to first mat from OT office with RW and supervision   06/11/23- Arm bike x 6 mins for conditioning Pt ambulated with walker to mat 25' with RW and supervision Seated on edge of mat: biceps  curls, shoulder flexion, chest press holding 1 lbs weight in each hand 2 sets of 10 reps for increased UE strength for ADLs. ( Pt then trialed 2 lbs weight, pt performed 3-5 reps of each, pt plans to order 1 and 2 lbs weights of the internet, pt was instructed that if she uses 2 lbs  weight she should reduce reps)Pt then ambulated form mat to w/c grossly 25' with walker with supevision Fine motor coordination activity placing and removing graded pegs from peg board with LUE, min difficulty/ v.c   06/05/23-Pt reports being sick then having her IVIG treatment  Pt performed fine motor coordination activity to to copy small peg design with LUE, min difficulty and increased time required, min v.c for design. Standing to place and remove graded clothespins 1-8# from vertical antennae with left and right UE , pt required 1 rest break due to fatigue. Red theraband exercises for increased strength and endurance for the following :abduction , biceps and triceps, 15 reps each  05/13/23 Pt ambulated to and from arm bike with cane and min a grossly 3 ft.  Arm bike x 6 mins level 3 for conditioning Therapist checked progress towards goals and discussed pt performance towards goals. Pt would like to continue therapy. Pt stood for 1 min 18 secs for functional reaching prior to rest break. Pt stood a second time for grossly 1 min prior to rest break. Frequent rest breaks are required due to pt's recent IVIG, and pt states she gets fatigued more quickly.   05/08/23- Pt ambulated grossly 3 ft with cane with minguard to and from to arm bike. Arm bike x 6 mins level 1 for conditioning Pt copied small peg design with left and right UE's for increased fine motor coordination, mod difficulty for LUE and drops, occasional min difficulty with RUE. Pt's LUE continues to fatigue quickly. Standing to perform functional reaching to retrieve items from chair to place on elevated surface with close supervision for standing balance and tolerance. Rest break required then pt was able to return items to lower surface with RUE with supervision. Pt continues to fatigue quickly and she stood for less than a minute.    05/01/23- Arm bike x 6 mins level 2 for conditioning, pt walked to and from arm bike with walker  and supervision. Placing and removing grooved pegs from pegboard with LUE for increased fine motor coordination, removing with in hand manipulation, min difficulty/ drops and v.c Placing graded clothespins 1-8# on vertical antennae with LUE then removing with RUE for sustained pinch, min v.c  Gripper set at level 2, pt picked up blocks with LUE for sustained grip, min difficulty/ drops   04/22/23:  This progress update covers dates of service from 6/4-04/22/23.   Started session working on LUE coordination.  Copying small peg patterns.  Patient with frequent drops, difficulty orienting small pegs in hand.  Patient with report of diminished sensation in left hand.  Attempted same task with left hand in glove with slightly increased ability initially - however - not able to sustain.   Worked on hand strengthening with gripper level 2 x 20 blocks.  Faciliatation an dcueing to reduce trunk lateral flexion and shoulder elevation.  Attempted 5 blocks at level 3 - fatigued after 3 blocks.   Standing balance/ tolerance at counter - working toward reducing UE reliance - reaching into chest high shelf, reaching to shelf at eye level.  Stand with walker and taking slow cautious steps to counter.  04/08/23-Pt ambulated approximately 5 feet to walk to arm bike with rolling walker with close supervision. Arm bike x 6 mins level 1 for conditioning Min a for sit to stand from armbike then pt ambulated back to w/c. Seated in w/c closed chain shoulder flexion, with unweighted ball followed by shoulder flexion then trunk rotation and chest press wih 2.2 lbs weighted ball for UE strength and endurance, rest between sets. Fine motor coordination task to copy small peg design with LUE, min-mod difficulty, v.c to avoid shoulder hike/ compensation.  04/01/23- Seated closed chain shoulder flexion, with unweighted ball followed by shoulder flexion then trunk rotation and chest press wih 2.2 lbs weighted ball. Rest in between sets  seated on mat. Pt walker to mat with walker and supervision. Standing at countertop functional reaching to place graded clothespins in targets with RUE, rest break then pt removed with left hand, close supervision. Discussed plans to continue OT after next week. Pt walked mat to w/c with walker and close supervision .   03/27/23-Theraband exercises for biceps curls, triceps, rowing  and shoulder extension with green band and shoulder abduction with red band 10-15 reps each, min v.c, rest break in between sets Standing at countertop to place and remove items from lowest overhead shelf, seated rest break prior to replacing, close supervision for moblity with walker  Gripper set at level 2 sustained grip to pick up 1 inch blocks, 1/2 with RUE with min difficulty and 1/2 with left with min-mod difficulty for sustained grip Graded clothespins for sustained pinch 1-8# using left and right UE's for sustained pinch, RUE only with black clothespins dur to difficulty. Therapist recommends that pt orders a long handled sponge to apply lotion onto legs. Pt with continued bilateral LE swelling.  03/25/23 Pt is hoarse today and reports a tickling in her throat as well as LE swelling. Therapist recommends pt calls MD.Pt arrived late as her transportation did not pick her up. Arm bike x 6 mins level 1 for conditioning.Pt transferred to and from arm bike to w/c with min A. Copying small peg design for increased fine motor coordination with LUE, min difficulty and increased time, removing with in hand manipulation. Pt demonstrates improved fine motor coordination.  03/18/23- Pt amb across gym with RW and minguard to arm bike.  Arm bike x 7 mins level 1  for conditioning Placing small pegs into pegboard with LUE for increased fine motor coordination, pt with improved performance and coordination today. Pt walked 3 ft from chair to counter and stood to place and remove items from overhead shelves with 1 seated rest  break. Discussion with patient regarding her home situation,. Pt reports frustration and she feels like she is a burden on husband. Therapist recommends pt seeks counseling. Shoulder abduction  with red band biceps curls with green band  20 reps for UE strengthening   03/13/23-Pt reports she had a stomach bug and she wasn't feeling well. Placing large pegs into pegboard with LUE with occasional min difficulty, then removing with 3 pt pinch min v.c to avoid shoulder hike. Red theraband exercises for shoulder abduction, rowing and extension 15 reps each,  green band for biceps curls and triceps extension 15 reps each, min v.c Arm bike x 6 mins level 3 for conditioning, pt transfered to and from with min A Discussed safety for cooking and recommendation that if pt decides to fry fish, she performs with someone for safety, and she keeps her LUE away from hot surfaces due  to sensory deficits. Pt verbalized understanding.   03/04/23 Red theraband exercises for shoulder abduction, rowing and extension 20 reps each, upgraded to green band for biceps curls and triceps extension 20 reps each, min v.c Arm bike x 8 mins, 4 mins each direction, level 2 for conditioning, pt transferred to and from seat with supervision. Coordination activities with LUE: flipping and dealing cards, min difficulty/ v.c  02/27/23-Arm bike x 6 mins level 2 for conditioning (Pt transfers to and from w/c with supervision) Placing large pegs into pegboard with LUE then removing for increased fine motor coordination, min difficulty/ v.c  Standing at countertop with supervision to place and remove items with right then left UE's, pt stood x 3 for 30 secs to 1 min 15 secs, rest breaks in between Gripper set at level 1 to pick up 1 inch blocks  with right and left UE's for sustained grip, min difficulty Pt reports amb to BR with walker without assist, therapist recommends supervision for safety.  02/25/23-Arm bike x 6 mins level 2 for  conditioning, Pt transferred to the seat for the first time with supervision Reviewed previously issued red theraband exercises for UE strength 20 reps each, min v.c  Fine motor coordination exercises with left and right UE's, flipping playing cards and stacking coins with min difficulty using RUE, picking up coins and placing in container with LUE and flipping cards mod difficulty. Pt attempted placing small pegs into a pegboard with bilateral UE's max difficulty with LUE, mod difficulty with RUE, task was discontinued. Pt reports being a little off. She took her meds without eating and got into a disagreement with transportation this a.m.    02/18/23- re-eval performed and therapist discussed potential goals with patient.  PATIENT EDUCATION: Education details: see above Person educated: Patient Education method: explanation, demonstration Education comprehension: verbalized understanding, returned demonstration.  HOME EXERCISE PROGRAM:  12/17/22- coordination HEP, cane exercises seated   GOALS: Goals reviewed with patient? Yes  SHORT TERM GOALS: Target date: 06/12/23  I with initial HEP Baseline: Goal status: met 01/21/23  2.  Pt will demonstrate improved UE functional use as evidenced increasing bilateral box/ blocks score by 4 blocks.  Baseline: R 35, L 26 Goal status  met 12/31/22 RUE :43:  LUE 35    3.  Pt will donn pants with mod A. Baseline: max A Goal status:met  02/18/23- pt performed mod I today  4. Pt will verbalize understanding of adapted strategies to maximize safety and I with ADLs/ IADLs .  Goal status:  ongoing, has tub bench and long handled sponge  8/6  ongoing - patient relies on husband to lift legs into tub - encouraging patient to increase participation with IADL at home.    5.  Pt will demonstrate ability to stand for functional activity with min A x 10 mins in prep for ADLs.  Revised short term goal 05/13/23 -Pt will stand for 5 mins prior to rest break in  prep for ADLS.  Goal status: ongoing pt stood for 1 min on 02/18/23 8/6:  Stood x 2 min in clinic - patient reporting increased fatigue due to IVIG treatment.  05/13/23-Stood 1 min 18 secs  6.  Pt will perform w/c to [facility] transfers mod I-  mod I per pt report 05/08/23  Goal status:  met, 02/18/23 7. Pt will increase bilateral grip strength by 5 lbs for increased functional use of UE's  Baseline 05/13/23-RUE 42, LUE 34  Goal status: new  LONG TERM GOALS: Target date: 07/08/23 I with updated HEP  Goal status: ongoing, 05/13/23  2.  Pt will perform LB dressing mod I. Baseline:max A Goal status:met 03/25/23- 8/6- patient indicates she is dressing herself.   3.  Pt will bathe with supervision/ set up. Baseline: min-mod A for sponge bath Goal status: duplicate goal, therefore deferred,  see below, pt met for sponge bathing.  4.  Pt will perform basic cooking at a w/c level mod I  Revised goal: Pt will perform basic cooking modified independently at a walker level.  Goal status:  Ongoing, pt is currently performing at a w/c level 05/13/23, not walking in kitchen  5.  Pt will demonstrate improved fine motor coordination as evidenced by performing LUE 9 hole peg test in 2 mins or less. - initial goal met Revised goal:Pt will demonstrate improved fine motor coordination as evidenced by performing 9 hole peg test in 55 secs or less  8/6- 63 seconds- ongoing  Goal status: ongoing- 1 min 17 secs on 05/13/23  6.  Pt will demonstrate improved fine motor coordination as evidenced by performing RUE 9 hole peg test in 1 mins  30 secs or less.  Baseline: RUE  1 min 48 secs, LUE 4 pegs placed in 2 mins  Goal status: met 6/4/246/4/24- RUE 35.46,     7. Pt will perform shower with supervision using tub transfer bench Goal status: ongoing,  partially met pt has assist with lifting legs into tub, then she completes bathing at a supervision level, has assist with drying 05/13/23    8. Pt will perform basic  home management at a walker level mod I    Goal status: ongoing, 05/13/23- still performing at w/c level, not consistent    CLINICAL IMPRESSION: Pt is progressing towards goals. She continues to be motivated ti improve coordination and grip strength. Pt demonstrates improving funcitonal mobility however she reports shortness of breath with ambulation. PERFORMANCE DEFICITS: in functional skills including ADLs, IADLs, coordination, dexterity, sensation, ROM, strength, flexibility, Fine motor control, Gross motor control, mobility, balance, endurance, decreased knowledge of precautions, decreased knowledge of use of DME, and UE functional use, cognitive skills including  and psychosocial skills including coping strategies, environmental adaptation, habits, interpersonal interactions, and routines and behaviors.   IMPAIRMENTS: are limiting patient from ADLs, IADLs, rest and sleep, play, leisure, and social participation.   CO-MORBIDITIES: may have co-morbidities  that affects occupational performance. Patient will benefit from skilled OT to address above impairments and improve overall function.  MODIFICATION OR ASSISTANCE TO COMPLETE EVALUATION: No modification of tasks or assist necessary to complete an evaluation.  OT OCCUPATIONAL PROFILE AND HISTORY: Detailed assessment: Review of records and additional review of physical, cognitive, psychosocial history related to current functional performance.  CLINICAL DECISION MAKING: LOW - limited treatment options, no task modification necessary  REHAB POTENTIAL: Good  EVALUATION COMPLEXITY: Low    PLAN:  OT FREQUENCY: 2x/week  OT DURATION: 8 weeks  PLANNED INTERVENTIONS: self care/ADL training, therapeutic exercise, therapeutic activity, neuromuscular re-education, manual therapy, passive range of motion, gait training, balance training, stair training, functional mobility training, aquatic therapy, ultrasound, paraffin, moist heat, cryotherapy,  patient/family education, energy conservation, coping strategies training, DME and/or AE instructions, and Re-evaluation  RECOMMENDED OTHER SERVICES: PT  CONSULTED AND AGREED WITH PLAN OF CARE: Patient and family member/caregiver  PLAN FOR NEXT SESSION: continue to work towards updated goals, coordination, standing tolerance for ADLs, check goals next week and plan to renew.  Nevaeha Finerty, OT  07/01/2023, 9:54 AM          3

## 2023-07-03 ENCOUNTER — Ambulatory Visit: Payer: 59 | Admitting: Occupational Therapy

## 2023-07-03 ENCOUNTER — Ambulatory Visit: Payer: 59 | Admitting: Physical Therapy

## 2023-07-07 ENCOUNTER — Ambulatory Visit: Payer: 59 | Admitting: Occupational Therapy

## 2023-07-08 ENCOUNTER — Ambulatory Visit: Payer: 59 | Admitting: Family Medicine

## 2023-07-09 ENCOUNTER — Ambulatory Visit: Payer: 59 | Admitting: Surgical

## 2023-07-09 ENCOUNTER — Ambulatory Visit: Payer: 59 | Admitting: Occupational Therapy

## 2023-07-09 ENCOUNTER — Ambulatory Visit: Payer: 59 | Admitting: Physical Therapy

## 2023-07-09 NOTE — Therapy (Deleted)
OUTPATIENT OCCUPATIONAL THERAPY NEURO TREATMENT   Patient Name: Dana [name] MRN: [id number] DOB:11-23-1965, 57 y.o., female Today's Date: 07/09/2023  PCP: Dr. Clent Ridges REFERRING PROVIDER: Dr. Gershon Crane  END OF SESSION:               Past Medical History:  Diagnosis Date   Anxiety    Back pain with radiation    Breast mass, right    Chronic pain    Chronic, continuous use of opioids    Complication of anesthesia 04/07/14   Allergic reaction to Lisinopril immediately following surgery   DJD (degenerative joint disease)    Fibromyalgia    Groin abscess    Headache(784.0)    History of IBS    Hypercholesterolemia    IBS (irritable bowel syndrome)    Lactose intolerance    Mild hypertension    Obesity    Tobacco use disorder    Umbilical hernia    Symptomatic   Past Surgical History:  Procedure Laterality Date   ABDOMINAL HYSTERECTOMY     ANTERIOR CERVICAL DECOMP/DISCECTOMY FUSION N/A 12/20/2015   Procedure: ANTERIOR CERVICAL DECOMPRESSION FUSION CERVICAL 4-5, CERVICAL 5-6, CERVICAL 6-7 WITH INSTRUMENTATION AND ALLOGRAFT;  Surgeon: Estill Bamberg, MD;  Location: [facility] OR;  Service: Orthopedics;  Laterality: N/A;  Anterior cervical decompression fusion, cervical 4-5, cervical 5-6, cervical 6-7 with instrumentation and allograft   BACK SURGERY     BILATERAL SALPINGECTOMY  09/03/2012   Procedure: BILATERAL SALPINGECTOMY;  Surgeon: Ok Edwards, MD;  Location: [facility];  Service: Gynecology;  Laterality: Bilateral;   BREAST BIOPSY Right 04/07/2014   Procedure: REMOVAL RIGHT BREAST MASS WITH WIRE LOCALIZATION;  Surgeon: Adolph Pollack, MD;  Location: [facility] OR;  Service: General;  Laterality: Right;   BREAST EXCISIONAL BIOPSY Left    x2   BREAST LUMPECTOMY     x2   CARPAL TUNNEL RELEASE Left 05/11/2019   Procedure: LEFT CARPAL TUNNEL RELEASE, RIGHT TENNIS ELBOW MARCAINE/DEPO MEDROL INJECTION UNDER ANESTHESIA;  Surgeon: Kerrin Champagne, MD;  Location: [facility] OR;  Service:  Orthopedics;  Laterality: Left;   COLONOSCOPY W/ BIOPSIES  04/24/2012   per Dr. Leone Payor, clear, repeat in 10 yrs    disectomy     ESOPHAGOGASTRODUODENOSCOPY     FINGER SURGERY     Right index-excision of mass    FOOT SURGERY Right    Bone Spurs   IR FLUORO GUIDE CV LINE RIGHT  01/29/2023   IR REMOVAL TUN CV CATH W/O FL  02/08/2023   IR US GUIDE VASC ACCESS RIGHT  01/31/2023   LAPAROSCOPIC HYSTERECTOMY  09/03/2012   Procedure: HYSTERECTOMY TOTAL LAPAROSCOPIC;  Surgeon: Ok Edwards, MD;  Location: [facility];  Service: Gynecology;  Laterality: N/A;   LUMBAR DISC SURGERY     TUBAL LIGATION     UMBILICAL HERNIA REPAIR N/A 05/01/2018   Procedure: UMBILICAL HERNIA REPAIR;  Surgeon: Jimmye Norman, MD;  Location: [facility] OR;  Service: General;  Laterality: N/A;   Patient Active Problem List   Diagnosis Date Noted   CIDP (chronic inflammatory demyelinating polyneuropathy) (HCC) 01/28/2023   Gait abnormality 01/28/2023   Glaucoma 01/28/2023   Pain due to onychomycosis of toenails of both feet 01/21/2023   Wheelchair dependence 12/30/2022   Nerve pain 12/30/2022   Insomnia due to medical condition 12/30/2022   Acute inflammatory demyelinating polyneuropathy (HCC) 10/01/2022   UTI (urinary tract infection) 09/24/2022   Hypomagnesemia 09/24/2022   Weakness 09/23/2022   Hypokalemia 09/23/2022   B12 deficiency 09/05/2022  Folate deficiency 09/05/2022   Carpal tunnel syndrome, left upper limb 05/11/2019    Class: Chronic   Spinal stenosis of lumbar region with neurogenic claudication 08/20/2018   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   Primary osteoarthritis of right knee 02/27/2017   Right tennis elbow 11/11/2016   Muscle cramps 01/15/2016   Bilateral leg edema 01/08/2016   Radiculopathy 12/20/2015   Hyperglycemia 12/21/2014   Mastodynia, female 11/30/2014   S/P excision of fibroadenoma of breast 11/30/2014   Angioedema of lips 04/07/2014   Fibroadenoma of right breast 03/07/2014    Pelvic pain in female 06/19/2012   Menorrhagia 06/11/2012   SUI (stress urinary incontinence, female) 06/11/2012   HTN (hypertension) 06/11/2012   IBS (irritable bowel syndrome) - diarrhea predominant 03/17/2012   MICROSCOPIC HEMATURIA 11/30/2010   BREAST PAIN, RIGHT 11/30/2010   BREAST MASS, RIGHT 01/12/2010   HYPERCHOLESTEROLEMIA 12/07/2007   CIGARETTE SMOKER 12/07/2007   Osteoarthritis 12/07/2007   Low back pain with sciatica 12/07/2007   Headache 12/07/2007   Obesity, Class III, BMI 40-49.9 (morbid obesity) (HCC) 08/03/2007   Anxiety state 08/03/2007    ONSET DATE: 12/05/22- referral date  REFERRING DIAG: G61.0 (ICD-10-CM) - Guillain Barr syndrome (HCC)  THERAPY DIAG:    Rationale for Evaluation and Treatment: Rehabilitation  SUBJECTIVE:   SUBJECTIVE STATEMENT: Pt reports she had a stomach bug and that's why she missed therapy PERTINENT HISTORY: Pt is a 57 y/o female hospitalized 09/23/22  with progressive weakness and sensation changes, consistent with Guillain-Barr syndrome. MRI negative. LP results and assessment most consistent with Guillain-Barr syndrome.  Pt was d/c home from CIR 11/06/22 PMH includes: glaucoma anxiety, DJD, fibromyalgia, HTN, tobacco use, B-12 deficiency, OA, ACDF 2017.  Pt was re-hospitalized 01/28/2456 with CIDP (chronic inflammatory demyelinating polyneuropathy). Saw Neuro on day of admission, NCS showed demyelination, referred to hospital for PLEX. Initially diagnosed with an acute demyelinating neuropathy Jan 2024, treated with IVIG. PMH: Spinal stenosis, HTN, Obesity   PRECAUTIONS: Fall  WEIGHT BEARING RESTRICTIONS: no PAIN: no  A      FALLS: Has patient fallen in last 6 months? No  LIVING ENVIRONMENT: Lives with: lives with their spouse Lives in: House/apartment Stairs: has ramp built Has following equipment at home:   BSC, steadi,  tub bench but can't access bathroom   PLOF: Independent  PATIENT GOALS: increase independence    OBJECTIVE:   HAND DOMINANCE: Right  ADLs: from initial eval Overall ADLs: assistance required, unable to access BR Transfers/ambulation related to ADLs: Eating: unable to cut food Grooming: set up UB Dressing: set up for bra and shirt LB Dressing: max A Toileting: uses BSC uses steadi Bathing: min-mod A in hospital  bed  Tub Shower transfers: n/a Equipment: Emergency planning/management officer  IADLs:dependent for IADLS   MOBILITY STATUS:  uses w/c  , per PT report minguard for transfer from w/c to mat    ACTIVITY TOLERANCE: Activity tolerance: Pt fatigues quickly    UPPER EXTREMITY ROM:  see updates for 02/18/23 re-eval   Active ROM Right 02/18/23 Left 02/18/23  Shoulder flexion 130 120  Shoulder abduction 90 90  Shoulder adduction    Shoulder extension    Shoulder internal rotation    Shoulder external rotation    Elbow flexion    Elbow extension  -5  Wrist flexion    Wrist extension    Wrist ulnar deviation    Wrist radial deviation    Wrist pronation    Wrist supination    (Blank rows = not  tested)  UPPER EXTREMITY MMT:   see below for re-eval 02/18/23  MMT Right 02/18/23 Left 02/18/23  Shoulder flexion 3+/5 3+/5  Shoulder abduction    Shoulder adduction    Shoulder extension    Shoulder internal rotation    Shoulder external rotation    Middle trapezius    Lower trapezius    Elbow flexion 4/5 4/5  Elbow extension 4-/5 4-/5  Wrist flexion    Wrist extension    Wrist ulnar deviation    Wrist radial deviation    Wrist pronation    Wrist supination      HAND FUNCTION: Grip strength: Right: 32 lbs; Left: 18 lbs, 02/18/23 R 55 lbs, L 35 lbs 05/13/23- grip RUE 42, LUE 34  COORDINATION: 9 Hole Peg test:initial eval  Right: 1 min 48 sec; Left: 4 pegs in 2 mins  Box and Blocks:  Right 35 blocks, Left  26 blocks 02/18/23- RUE 35.46, LUE 65 secs  SENSATION: Light touch: WFL Hot/Cold: Not tested    COGNITION: Overall cognitive status:NT   VISION: Subjective  report: reports hx of glaucoma VISION ASSESSMENT: Not tested  OBSERVATIONS: Pt is eager to improve, pt's husband is very supportive   TODAY'S TREATMENT:                                                                                                                              DATE:07/01/23- Arm bike x 6 mins level 1  Pt ambulated with supervision from arm bike to OT therapy room approximately 14ft with RW for endurance for ADLs Pt reported SOB after amb. HR 107, O2 sats 100% Pt rested for 5 mins and HR=95% Placing grooved pegs into pegboard with LUE for increased fine motor coordination, increased time and min-mod difficulty Gripper set at level 2 for sustained grip to pick up 1 inch blocks, with right and left UE's min difficulty/ drops.  06/26/23-Arm bike x 6 mins level 1 for conditioning Pt ambulated w/c to arm bike 10' then pt later ambulated down the hall approximately 50 ft  Pt copied small peg design for small peg design with left and right UE's for increased fine motor coordination, mod difficulty with LUE, min difficulty with right. Grip R 40 lbs, L 30 lbs Graded clothespins  1-8# for sustained pinch for LUE mod difficulty, RUE min difficulty.  Pt was able to ambulate back to main gym with supervision approximately 40 ft  to first mat from OT office with RW and supervision   06/11/23- Arm bike x 6 mins for conditioning Pt ambulated with walker to mat 25' with RW and supervision Seated on edge of mat: biceps curls, shoulder flexion, chest press holding 1 lbs weight in each hand 2 sets of 10 reps for increased UE strength for ADLs. ( Pt then trialed 2 lbs weight, pt performed 3-5 reps of each, pt plans to order 1 and 2 lbs weights of the internet, pt was instructed that  if she uses 2 lbs weight she should reduce reps)Pt then ambulated form mat to w/c grossly 25' with walker with supevision Fine motor coordination activity placing and removing graded pegs from peg board with LUE, min  difficulty/ v.c   06/05/23-Pt reports being sick then having her IVIG treatment  Pt performed fine motor coordination activity to to copy small peg design with LUE, min difficulty and increased time required, min v.c for design. Standing to place and remove graded clothespins 1-8# from vertical antennae with left and right UE , pt required 1 rest break due to fatigue. Red theraband exercises for increased strength and endurance for the following :abduction , biceps and triceps, 15 reps each  05/13/23 Pt ambulated to and from arm bike with cane and min a grossly 3 ft.  Arm bike x 6 mins level 3 for conditioning Therapist checked progress towards goals and discussed pt performance towards goals. Pt would like to continue therapy. Pt stood for 1 min 18 secs for functional reaching prior to rest break. Pt stood a second time for grossly 1 min prior to rest break. Frequent rest breaks are required due to pt's recent IVIG, and pt states she gets fatigued more quickly.   05/08/23- Pt ambulated grossly 3 ft with cane with minguard to and from to arm bike. Arm bike x 6 mins level 1 for conditioning Pt copied small peg design with left and right UE's for increased fine motor coordination, mod difficulty for LUE and drops, occasional min difficulty with RUE. Pt's LUE continues to fatigue quickly. Standing to perform functional reaching to retrieve items from chair to place on elevated surface with close supervision for standing balance and tolerance. Rest break required then pt was able to return items to lower surface with RUE with supervision. Pt continues to fatigue quickly and she stood for less than a minute.    05/01/23- Arm bike x 6 mins level 2 for conditioning, pt walked to and from arm bike with walker and supervision. Placing and removing grooved pegs from pegboard with LUE for increased fine motor coordination, removing with in hand manipulation, min difficulty/ drops and v.c Placing graded  clothespins 1-8# on vertical antennae with LUE then removing with RUE for sustained pinch, min v.c  Gripper set at level 2, pt picked up blocks with LUE for sustained grip, min difficulty/ drops   04/22/23:  This progress update covers dates of service from 6/4-04/22/23.   Started session working on LUE coordination.  Copying small peg patterns.  Patient with frequent drops, difficulty orienting small pegs in hand.  Patient with report of diminished sensation in left hand.  Attempted same task with left hand in glove with slightly increased ability initially - however - not able to sustain.   Worked on hand strengthening with gripper level 2 x 20 blocks.  Faciliatation an dcueing to reduce trunk lateral flexion and shoulder elevation.  Attempted 5 blocks at level 3 - fatigued after 3 blocks.   Standing balance/ tolerance at counter - working toward reducing UE reliance - reaching into chest high shelf, reaching to shelf at eye level.  Stand with walker and taking slow cautious steps to counter.    04/08/23-Pt ambulated approximately 5 feet to walk to arm bike with rolling walker with close supervision. Arm bike x 6 mins level 1 for conditioning Min a for sit to stand from armbike then pt ambulated back to w/c. Seated in w/c closed chain shoulder flexion, with unweighted ball followed  by shoulder flexion then trunk rotation and chest press wih 2.2 lbs weighted ball for UE strength and endurance, rest between sets. Fine motor coordination task to copy small peg design with LUE, min-mod difficulty, v.c to avoid shoulder hike/ compensation.  04/01/23- Seated closed chain shoulder flexion, with unweighted ball followed by shoulder flexion then trunk rotation and chest press wih 2.2 lbs weighted ball. Rest in between sets seated on mat. Pt walker to mat with walker and supervision. Standing at countertop functional reaching to place graded clothespins in targets with RUE, rest break then pt removed with left  hand, close supervision. Discussed plans to continue OT after next week. Pt walked mat to w/c with walker and close supervision .   03/27/23-Theraband exercises for biceps curls, triceps, rowing  and shoulder extension with green band and shoulder abduction with red band 10-15 reps each, min v.c, rest break in between sets Standing at countertop to place and remove items from lowest overhead shelf, seated rest break prior to replacing, close supervision for moblity with walker  Gripper set at level 2 sustained grip to pick up 1 inch blocks, 1/2 with RUE with min difficulty and 1/2 with left with min-mod difficulty for sustained grip Graded clothespins for sustained pinch 1-8# using left and right UE's for sustained pinch, RUE only with black clothespins dur to difficulty. Therapist recommends that pt orders a long handled sponge to apply lotion onto legs. Pt with continued bilateral LE swelling.  03/25/23 Pt is hoarse today and reports a tickling in her throat as well as LE swelling. Therapist recommends pt calls MD.Pt arrived late as her transportation did not pick her up. Arm bike x 6 mins level 1 for conditioning.Pt transferred to and from arm bike to w/c with min A. Copying small peg design for increased fine motor coordination with LUE, min difficulty and increased time, removing with in hand manipulation. Pt demonstrates improved fine motor coordination.  03/18/23- Pt amb across gym with RW and minguard to arm bike.  Arm bike x 7 mins level 1  for conditioning Placing small pegs into pegboard with LUE for increased fine motor coordination, pt with improved performance and coordination today. Pt walked 3 ft from chair to counter and stood to place and remove items from overhead shelves with 1 seated rest break. Discussion with patient regarding her home situation,. Pt reports frustration and she feels like she is a burden on husband. Therapist recommends pt seeks counseling. Shoulder abduction  with  red band biceps curls with green band  20 reps for UE strengthening   03/13/23-Pt reports she had a stomach bug and she wasn't feeling well. Placing large pegs into pegboard with LUE with occasional min difficulty, then removing with 3 pt pinch min v.c to avoid shoulder hike. Red theraband exercises for shoulder abduction, rowing and extension 15 reps each,  green band for biceps curls and triceps extension 15 reps each, min v.c Arm bike x 6 mins level 3 for conditioning, pt transfered to and from with min A Discussed safety for cooking and recommendation that if pt decides to fry fish, she performs with someone for safety, and she keeps her LUE away from hot surfaces due to sensory deficits. Pt verbalized understanding.   03/04/23 Red theraband exercises for shoulder abduction, rowing and extension 20 reps each, upgraded to green band for biceps curls and triceps extension 20 reps each, min v.c Arm bike x 8 mins, 4 mins each direction, level 2 for conditioning, pt  transferred to and from seat with supervision. Coordination activities with LUE: flipping and dealing cards, min difficulty/ v.c  02/27/23-Arm bike x 6 mins level 2 for conditioning (Pt transfers to and from w/c with supervision) Placing large pegs into pegboard with LUE then removing for increased fine motor coordination, min difficulty/ v.c  Standing at countertop with supervision to place and remove items with right then left UE's, pt stood x 3 for 30 secs to 1 min 15 secs, rest breaks in between Gripper set at level 1 to pick up 1 inch blocks  with right and left UE's for sustained grip, min difficulty Pt reports amb to BR with walker without assist, therapist recommends supervision for safety.  02/25/23-Arm bike x 6 mins level 2 for conditioning, Pt transferred to the seat for the first time with supervision Reviewed previously issued red theraband exercises for UE strength 20 reps each, min v.c  Fine motor coordination exercises  with left and right UE's, flipping playing cards and stacking coins with min difficulty using RUE, picking up coins and placing in container with LUE and flipping cards mod difficulty. Pt attempted placing small pegs into a pegboard with bilateral UE's max difficulty with LUE, mod difficulty with RUE, task was discontinued. Pt reports being a little off. She took her meds without eating and got into a disagreement with transportation this a.m.    02/18/23- re-eval performed and therapist discussed potential goals with patient.  PATIENT EDUCATION: Education details: see above Person educated: Patient Education method: explanation, demonstration Education comprehension: verbalized understanding, returned demonstration.  HOME EXERCISE PROGRAM:  12/17/22- coordination HEP, cane exercises seated   GOALS: Goals reviewed with patient? Yes  SHORT TERM GOALS: Target date: 06/12/23  I with initial HEP Baseline: Goal status: met 01/21/23  2.  Pt will demonstrate improved UE functional use as evidenced increasing bilateral box/ blocks score by 4 blocks.  Baseline: R 35, L 26 Goal status  met 12/31/22 RUE :43:  LUE 35    3.  Pt will donn pants with mod A. Baseline: max A Goal status:met  02/18/23- pt performed mod I today  4. Pt will verbalize understanding of adapted strategies to maximize safety and I with ADLs/ IADLs .  Goal status:  ongoing, has tub bench and long handled sponge  8/6  ongoing - patient relies on husband to lift legs into tub - encouraging patient to increase participation with IADL at home.    5.  Pt will demonstrate ability to stand for functional activity with min A x 10 mins in prep for ADLs.  Revised short term goal 05/13/23 -Pt will stand for 5 mins prior to rest break in prep for ADLS.  Goal status: ongoing pt stood for 1 min on 02/18/23 8/6:  Stood x 2 min in clinic - patient reporting increased fatigue due to IVIG treatment.  05/13/23-Stood 1 min 18 secs  6.  Pt will  perform w/c to [facility] transfers mod I-  mod I per pt report 05/08/23  Goal status:  met, 02/18/23 7. Pt will increase bilateral grip strength by 5 lbs for increased functional use of UE's  Baseline 05/13/23-RUE 42, LUE 34  Goal status: new     LONG TERM GOALS: Target date: 07/08/23 I with updated HEP  Goal status: ongoing, 05/13/23  2.  Pt will perform LB dressing mod I. Baseline:max A Goal status:met 03/25/23- 8/6- patient indicates she is dressing herself.   3.  Pt will bathe with supervision/ set up.  Baseline: min-mod A for sponge bath Goal status: duplicate goal, therefore deferred,  see below, pt met for sponge bathing.  4.  Pt will perform basic cooking at a w/c level mod I  Revised goal: Pt will perform basic cooking modified independently at a walker level.  Goal status:  Ongoing, pt is currently performing at a w/c level 05/13/23, not walking in kitchen  5.  Pt will demonstrate improved fine motor coordination as evidenced by performing LUE 9 hole peg test in 2 mins or less. - initial goal met Revised goal:Pt will demonstrate improved fine motor coordination as evidenced by performing 9 hole peg test in 55 secs or less  8/6- 63 seconds- ongoing  Goal status: ongoing- 1 min 17 secs on 05/13/23  6.  Pt will demonstrate improved fine motor coordination as evidenced by performing RUE 9 hole peg test in 1 mins  30 secs or less.  Baseline: RUE  1 min 48 secs, LUE 4 pegs placed in 2 mins  Goal status: met 6/4/246/4/24- RUE 35.46,     7. Pt will perform shower with supervision using tub transfer bench Goal status: ongoing,  partially met pt has assist with lifting legs into tub, then she completes bathing at a supervision level, has assist with drying 05/13/23    8. Pt will perform basic home management at a walker level mod I    Goal status: ongoing, 05/13/23- still performing at w/c level, not consistent    CLINICAL IMPRESSION: Pt is progressing towards goals. She continues to be  motivated ti improve coordination and grip strength. Pt demonstrates improving funcitonal mobility however she reports shortness of breath with ambulation. PERFORMANCE DEFICITS: in functional skills including ADLs, IADLs, coordination, dexterity, sensation, ROM, strength, flexibility, Fine motor control, Gross motor control, mobility, balance, endurance, decreased knowledge of precautions, decreased knowledge of use of DME, and UE functional use, cognitive skills including  and psychosocial skills including coping strategies, environmental adaptation, habits, interpersonal interactions, and routines and behaviors.   IMPAIRMENTS: are limiting patient from ADLs, IADLs, rest and sleep, play, leisure, and social participation.   CO-MORBIDITIES: may have co-morbidities  that affects occupational performance. Patient will benefit from skilled OT to address above impairments and improve overall function.  MODIFICATION OR ASSISTANCE TO COMPLETE EVALUATION: No modification of tasks or assist necessary to complete an evaluation.  OT OCCUPATIONAL PROFILE AND HISTORY: Detailed assessment: Review of records and additional review of physical, cognitive, psychosocial history related to current functional performance.  CLINICAL DECISION MAKING: LOW - limited treatment options, no task modification necessary  REHAB POTENTIAL: Good  EVALUATION COMPLEXITY: Low    PLAN:  OT FREQUENCY: 2x/week  OT DURATION: 8 weeks  PLANNED INTERVENTIONS: self care/ADL training, therapeutic exercise, therapeutic activity, neuromuscular re-education, manual therapy, passive range of motion, gait training, balance training, stair training, functional mobility training, aquatic therapy, ultrasound, paraffin, moist heat, cryotherapy, patient/family education, energy conservation, coping strategies training, DME and/or AE instructions, and Re-evaluation  RECOMMENDED OTHER SERVICES: PT  CONSULTED AND AGREED WITH PLAN OF CARE:  Patient and family member/caregiver  PLAN FOR NEXT SESSION: continue to work towards updated goals, coordination, standing tolerance for ADLs, check goals next week and plan to renew.  Hernando Reali, OT 07/09/2023, 8:12 AM          3

## 2023-07-12 ENCOUNTER — Other Ambulatory Visit: Payer: Self-pay | Admitting: Physical Medicine and Rehabilitation

## 2023-07-14 ENCOUNTER — Ambulatory Visit: Payer: 59 | Admitting: Physical Therapy

## 2023-07-16 ENCOUNTER — Telehealth: Payer: Self-pay | Admitting: Neurology

## 2023-07-16 NOTE — Telephone Encounter (Signed)
Please call  She should be getting ivig 1gram/kg, with total dose divided into two days, weight 126kg  About 65g each day x2 days.  If today is her 1st day at this infusion cycle, please add 10g more for 2nd day.   If today is her 2nd day, no need to do extra

## 2023-07-16 NOTE — Telephone Encounter (Signed)
Called Dana [name] and relayed the information to [name] and she states she will be back this week and will add on the 10g the next time she goes in a day or two, she reports that she had problems getting an IV and that's why it was wasted. She will call pharmacy and new orders will be faxed to be signed. She was grateful for the call back.

## 2023-07-16 NOTE — Telephone Encounter (Signed)
Patient is aware one month supply is being issued and will need to be seen before next refill. Patient not seen since 12/2022

## 2023-07-16 NOTE — Telephone Encounter (Signed)
[facility] Luisa Hart) Want to know if physician want to do a one time order or if ok with what she has had today and do rest with next infusion.Can contact at [phone]

## 2023-07-16 NOTE — Telephone Encounter (Signed)
Joann from [facility] called stating that yesterday the pt's infusion went well but today the IV started leaking and 10 grams of the medication was wasted and they would like to know if the provider would ok to replace it. Please advise.

## 2023-07-22 ENCOUNTER — Ambulatory Visit: Payer: 59 | Admitting: Physical Therapy

## 2023-07-22 ENCOUNTER — Ambulatory Visit: Payer: 59 | Attending: Family Medicine | Admitting: Occupational Therapy

## 2023-07-22 ENCOUNTER — Encounter: Payer: Self-pay | Admitting: Occupational Therapy

## 2023-07-22 ENCOUNTER — Encounter: Payer: Self-pay | Admitting: Physical Therapy

## 2023-07-22 DIAGNOSIS — R262 Difficulty in walking, not elsewhere classified: Secondary | ICD-10-CM | POA: Diagnosis present

## 2023-07-22 DIAGNOSIS — M6281 Muscle weakness (generalized): Secondary | ICD-10-CM | POA: Insufficient documentation

## 2023-07-22 DIAGNOSIS — G6181 Chronic inflammatory demyelinating polyneuritis: Secondary | ICD-10-CM | POA: Insufficient documentation

## 2023-07-22 DIAGNOSIS — R2681 Unsteadiness on feet: Secondary | ICD-10-CM | POA: Insufficient documentation

## 2023-07-22 DIAGNOSIS — R2689 Other abnormalities of gait and mobility: Secondary | ICD-10-CM

## 2023-07-22 DIAGNOSIS — R278 Other lack of coordination: Secondary | ICD-10-CM | POA: Insufficient documentation

## 2023-07-22 NOTE — Therapy (Signed)
OUTPATIENT OCCUPATIONAL THERAPY NEURO TREATMENT   Patient Name: Dana [name] MRN: [id number] DOB:1966-07-19, 57 y.o., female Today's Date: 07/22/2023  PCP: Dr. Clent Ridges REFERRING PROVIDER: Dr. Gershon Crane  END OF SESSION:  OT End of Session - 07/22/23 0947     Visit Number 28    Number of Visits 40    Date for OT Re-Evaluation 09/23/23    Authorization Type UHC, Medicare    Authorization Time Period KX    Authorization - Visit Number 31    Progress Note Due on Visit 33    OT Start Time 0930    OT Stop Time 1015    OT Time Calculation (min) 45 min    Activity Tolerance Patient tolerated treatment well    Behavior During Therapy [facility] for tasks assessed/performed                         Past Medical History:  Diagnosis Date   Anxiety    Back pain with radiation    Breast mass, right    Chronic pain    Chronic, continuous use of opioids    Complication of anesthesia 04/07/14   Allergic reaction to Lisinopril immediately following surgery   DJD (degenerative joint disease)    Fibromyalgia    Groin abscess    Headache(784.0)    History of IBS    Hypercholesterolemia    IBS (irritable bowel syndrome)    Lactose intolerance    Mild hypertension    Obesity    Tobacco use disorder    Umbilical hernia    Symptomatic   Past Surgical History:  Procedure Laterality Date   ABDOMINAL HYSTERECTOMY     ANTERIOR CERVICAL DECOMP/DISCECTOMY FUSION N/A 12/20/2015   Procedure: ANTERIOR CERVICAL DECOMPRESSION FUSION CERVICAL 4-5, CERVICAL 5-6, CERVICAL 6-7 WITH INSTRUMENTATION AND ALLOGRAFT;  Surgeon: Estill Bamberg, MD;  Location: [facility] OR;  Service: Orthopedics;  Laterality: N/A;  Anterior cervical decompression fusion, cervical 4-5, cervical 5-6, cervical 6-7 with instrumentation and allograft   BACK SURGERY     BILATERAL SALPINGECTOMY  09/03/2012   Procedure: BILATERAL SALPINGECTOMY;  Surgeon: Ok Edwards, MD;  Location: [facility];  Service: Gynecology;  Laterality:  Bilateral;   BREAST BIOPSY Right 04/07/2014   Procedure: REMOVAL RIGHT BREAST MASS WITH WIRE LOCALIZATION;  Surgeon: Adolph Pollack, MD;  Location: [facility] OR;  Service: General;  Laterality: Right;   BREAST EXCISIONAL BIOPSY Left    x2   BREAST LUMPECTOMY     x2   CARPAL TUNNEL RELEASE Left 05/11/2019   Procedure: LEFT CARPAL TUNNEL RELEASE, RIGHT TENNIS ELBOW MARCAINE/DEPO MEDROL INJECTION UNDER ANESTHESIA;  Surgeon: Kerrin Champagne, MD;  Location: [facility] OR;  Service: Orthopedics;  Laterality: Left;   COLONOSCOPY W/ BIOPSIES  04/24/2012   per Dr. Leone Payor, clear, repeat in 10 yrs    disectomy     ESOPHAGOGASTRODUODENOSCOPY     FINGER SURGERY     Right index-excision of mass    FOOT SURGERY Right    Bone Spurs   IR FLUORO GUIDE CV LINE RIGHT  01/29/2023   IR REMOVAL TUN CV CATH W/O FL  02/08/2023   IR US GUIDE VASC ACCESS RIGHT  01/31/2023   LAPAROSCOPIC HYSTERECTOMY  09/03/2012   Procedure: HYSTERECTOMY TOTAL LAPAROSCOPIC;  Surgeon: Ok Edwards, MD;  Location: [facility];  Service: Gynecology;  Laterality: N/A;   LUMBAR DISC SURGERY     TUBAL LIGATION     UMBILICAL HERNIA REPAIR N/A 05/01/2018  Procedure: UMBILICAL HERNIA REPAIR;  Surgeon: Jimmye Norman, MD;  Location: [facility] OR;  Service: General;  Laterality: N/A;   Patient Active Problem List   Diagnosis Date Noted   CIDP (chronic inflammatory demyelinating polyneuropathy) (HCC) 01/28/2023   Gait abnormality 01/28/2023   Glaucoma 01/28/2023   Pain due to onychomycosis of toenails of both feet 01/21/2023   Wheelchair dependence 12/30/2022   Nerve pain 12/30/2022   Insomnia due to medical condition 12/30/2022   Acute inflammatory demyelinating polyneuropathy (HCC) 10/01/2022   UTI (urinary tract infection) 09/24/2022   Hypomagnesemia 09/24/2022   Weakness 09/23/2022   Hypokalemia 09/23/2022   B12 deficiency 09/05/2022   Folate deficiency 09/05/2022   Carpal tunnel syndrome, left upper limb 05/11/2019    Class: Chronic   Spinal  stenosis of lumbar region with neurogenic claudication 08/20/2018   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   Primary osteoarthritis of right knee 02/27/2017   Right tennis elbow 11/11/2016   Muscle cramps 01/15/2016   Bilateral leg edema 01/08/2016   Radiculopathy 12/20/2015   Hyperglycemia 12/21/2014   Mastodynia, female 11/30/2014   S/P excision of fibroadenoma of breast 11/30/2014   Angioedema of lips 04/07/2014   Fibroadenoma of right breast 03/07/2014   Pelvic pain in female 06/19/2012   Menorrhagia 06/11/2012   SUI (stress urinary incontinence, female) 06/11/2012   HTN (hypertension) 06/11/2012   IBS (irritable bowel syndrome) - diarrhea predominant 03/17/2012   MICROSCOPIC HEMATURIA 11/30/2010   BREAST PAIN, RIGHT 11/30/2010   BREAST MASS, RIGHT 01/12/2010   HYPERCHOLESTEROLEMIA 12/07/2007   CIGARETTE SMOKER 12/07/2007   Osteoarthritis 12/07/2007   Low back pain with sciatica 12/07/2007   Headache 12/07/2007   Obesity, Class III, BMI 40-49.9 (morbid obesity) (HCC) 08/03/2007   Anxiety state 08/03/2007    ONSET DATE: 12/05/22- referral date  REFERRING DIAG: G61.0 (ICD-10-CM) - Guillain Barr syndrome (HCC)  THERAPY DIAG:  Muscle weakness (generalized) - Plan: Ot plan of care cert/re-cert  Other lack of coordination - Plan: Ot plan of care cert/re-cert  Unsteadiness on feet - Plan: Ot plan of care cert/re-cert  Other abnormalities of gait and mobility - Plan: Ot plan of care cert/re-cert    Rationale for Evaluation and Treatment: Rehabilitation  SUBJECTIVE:   SUBJECTIVE STATEMENT: Pt reports she difficulty with her IVIG treatment PERTINENT HISTORY: Pt is a 57 y/o female hospitalized 09/23/22  with progressive weakness and sensation changes, consistent with Guillain-Barr syndrome. MRI negative. LP results and assessment most consistent with Guillain-Barr syndrome.  Pt was d/c home from CIR 11/06/22 PMH includes: glaucoma anxiety, DJD, fibromyalgia, HTN, tobacco  use, B-12 deficiency, OA, ACDF 2017.  Pt was re-hospitalized 01/28/2456 with CIDP (chronic inflammatory demyelinating polyneuropathy). Saw Neuro on day of admission, NCS showed demyelination, referred to hospital for PLEX. Initially diagnosed with an acute demyelinating neuropathy Jan 2024, treated with IVIG. PMH: Spinal stenosis, HTN, Obesity   PRECAUTIONS: Fall  WEIGHT BEARING RESTRICTIONS: no  PAIN:  Are you having pain? Yes: NPRS scale: 6-8/10 Pain location: RUE Pain description: aching, sharp  Aggravating factors: weightbearing Relieving factors: massage       FALLS: Has patient fallen in last 6 months? No  LIVING ENVIRONMENT: Lives with: lives with their spouse Lives in: House/apartment Stairs: has ramp built Has following equipment at home:   BSC, steadi,  tub bench but can't access bathroom   PLOF: Independent  PATIENT GOALS: increase independence   OBJECTIVE:   HAND DOMINANCE: Right  ADLs: from initial eval Overall ADLs: assistance required, unable to access BR  Transfers/ambulation related to ADLs: Eating: unable to cut food Grooming: set up UB Dressing: set up for bra and shirt LB Dressing: max A Toileting: uses BSC uses steadi Bathing: min-mod A in hospital  bed  Tub Shower transfers: n/a Equipment: Emergency planning/management officer  IADLs:dependent for IADLS   MOBILITY STATUS:  uses w/c  , per PT report minguard for transfer from w/c to mat    ACTIVITY TOLERANCE: Activity tolerance: Pt fatigues quickly    UPPER EXTREMITY ROM:  see updates for 02/18/23 re-eval   Active ROM Right 02/18/23 Left 02/18/23  Shoulder flexion 130 120  Shoulder abduction 90 90  Shoulder adduction    Shoulder extension    Shoulder internal rotation    Shoulder external rotation    Elbow flexion    Elbow extension  -5  Wrist flexion    Wrist extension    Wrist ulnar deviation    Wrist radial deviation    Wrist pronation    Wrist supination    (Blank rows = not tested)  UPPER  EXTREMITY MMT:   see below for re-eval 02/18/23  MMT Right 02/18/23 Left 02/18/23  Shoulder flexion 3+/5 3+/5  Shoulder abduction    Shoulder adduction    Shoulder extension    Shoulder internal rotation    Shoulder external rotation    Middle trapezius    Lower trapezius    Elbow flexion 4/5 4/5  Elbow extension 4-/5 4-/5  Wrist flexion    Wrist extension    Wrist ulnar deviation    Wrist radial deviation    Wrist pronation    Wrist supination      HAND FUNCTION: Grip strength: Right: 32 lbs; Left: 18 lbs, 02/18/23 R 55 lbs, L 35 lbs 05/13/23- grip RUE 42, LUE 34  COORDINATION: 9 Hole Peg test:initial eval  Right: 1 min 48 sec; Left: 4 pegs in 2 mins  Box and Blocks:  Right 35 blocks, Left  26 blocks 02/18/23- RUE 35.46, LUE 65 secs  SENSATION: Light touch: WFL Hot/Cold: Not tested    COGNITION: Overall cognitive status:NT   VISION: Subjective report: reports hx of glaucoma VISION ASSESSMENT: Not tested  OBSERVATIONS: Pt is eager to improve, pt's husband is very supportive   TODAY'S TREATMENT:                                                                                                                              DATE:07/22/23-Pt returns to therapy after several weeks, she has been having difficulty with receiving her IVIG treatment, has had illness and has had increased pain in RUE and difficulty weightbearing through her right hand. Therapist checked progress towards goals. Discussion with pt regarding plans to renew however pt will need to be able to demonstrate progress to continue beyond new renewal period. Paraffin wax to RUE x 10 mins while pt performed fine motor coordination task LUE. No adverse reactions. Grooved pegs for LUE fine motor coordination, min  difficulty, then removing with in hand manipulation, min difficulty. Standing to place graded clothespins on target with LUE 1-8 # for sustained pinch close supervision. Pt was instructed to stop using putty  for RUE and to reduce her weightbearing though RUE to minimize pain. Therapist recommends pt sees MD regarding pain.   07/01/23- Arm bike x 6 mins level 1  Pt ambulated with supervision from arm bike to OT therapy room approximately 58ft with RW for endurance for ADLs Pt reported SOB after amb. HR 107, O2 sats 100% Pt rested for 5 mins and HR=95% Placing grooved pegs into pegboard with LUE for increased fine motor coordination, increased time and min-mod difficulty Gripper set at level 2 for sustained grip to pick up 1 inch blocks, with right and left UE's min difficulty/ drops.  06/26/23-Arm bike x 6 mins level 1 for conditioning Pt ambulated w/c to arm bike 10' then pt later ambulated down the hall approximately 50 ft  Pt copied small peg design for small peg design with left and right UE's for increased fine motor coordination, mod difficulty with LUE, min difficulty with right. Grip R 40 lbs, L 30 lbs Graded clothespins  1-8# for sustained pinch for LUE mod difficulty, RUE min difficulty.  Pt was able to ambulate back to main gym with supervision approximately 40 ft  to first mat from OT office with RW and supervision   PATIENT EDUCATION: Education details: see above Person educated: Patient Education method: explanation, demonstration Education comprehension: verbalized understanding, returned demonstration.  HOME EXERCISE PROGRAM:  12/17/22- coordination HEP, cane exercises seated   GOALS: Goals reviewed with patient? Yes  SHORT TERM GOALS: Target date: 08/20/23  I with initial HEP Baseline: Goal status: met 01/21/23  2.  Pt will demonstrate improved UE functional use as evidenced increasing bilateral box/ blocks score by 4 blocks.  Baseline: R 35, L 26 Goal status  met 12/31/22 RUE :43:  LUE 35    3.  Pt will donn pants with mod A. Baseline: max A Goal status:met  02/18/23- pt performed mod I today  4. Pt will verbalize understanding of adapted strategies to maximize  safety and I with ADLs/ IADLs .  Goal status:  met, pt has been using her tub bench  5.  Pt will stand for 5 mins prior to rest break in prep for ADLS. Revised goal: Pt will stand x 4 mins prior to rest break in prep for ADLS.  Goal status: ongoing, pt. stood for 2 mins 53 secs 07/22/23   6.  Pt will perform w/c to [facility] transfers mod I-  mod I per pt report 05/08/23  Goal status:  met, 02/18/23 7. Pt will increase bilateral grip strength by 5 lbs for increased functional use of UE's  Revised goal: Pt will demonstrate 33 lbs grip strength for RUE for increased functional use.    Goal status:  ongoing RUE 30 lbs,  LUE 28 lbs 07/22/23  8. Pt will demonstrate 31 lbs grip strength for LUE for increased functional use.  Baseline: 07/22/23 LUE 28 lbs  Goal status: initial      LONG TERM GOALS: Target date: 09/23/23 I with updated HEP  Goal status: met, 07/22/23  2.  Pt will perform LB dressing mod I. Baseline:max A Goal status:met 03/25/23- 8/6- patient indicates she is dressing herself.   3.  Pt will bathe with supervision/ set up. Baseline: min-mod A for sponge bath Goal status: duplicate goal, therefore deferred,  see below, pt  met for sponge bathing.  4. : Pt will perform basic cooking modified independently at a walker level. Revised goal: Pt will perform microwave cooking and sandwich prep at a walker level mod I Goal status:  Ongoing, pt is currently performing at a w/c level 07/22/23 not walking in kitchen consistently   5.  Pt will demonstrate improved fine motor coordination as evidenced by performing 9 hole peg test in 55 secs or less ( for LUE)  Revised goal: Pt will demonstrate improved fine motor coordination as evidenced by performing LUE 9 hole peg test in 68 secs or less Goal status: ongoing- 1 min 15 secs 07/22/23  6.  Pt will demonstrate improved fine motor coordination as evidenced by performing RUE 9 hole peg test in 1 mins  30 secs or less.  Baseline: RUE  1 min 48  secs, LUE 4 pegs placed in 2 mins  Goal status: met 02/18/23 RUE 35.46,     7. Pt will perform shower with supervision using tub transfer bench Goal status: met,pt performs mod I    8. Pt will perform basic home management at a walker level mod I    Revised goal: Pt will consistently wipe countertops off in standing and she will stand to fold laundry mod I.    Goal status: ongoing, 07/22/23- still performing at w/c level, not consistent, wipes countertops off in standing occasionally   9. Pt will report RUE pain no greater than 4/10 for functional activity.                             Baseline: 6-8/10    Goal status: initial  CLINICAL IMPRESSION: Pt made progress towards some of her goals however she was limited by medical issues which impeded her progress and ability to attend therapy. Pt will benefit from continued skilled occupational therapy to address the following deficits: decreased strength, pain decreased coordination, sensory deficits, decreased balance, decreased activity tolerance  in order to maximize pt's safety and I with daily activities.PERFORMANCE DEFICITS: in functional skills including ADLs, IADLs, coordination, dexterity, sensation, ROM, strength, flexibility, Fine motor control, Gross motor control, mobility, balance, endurance, decreased knowledge of precautions, decreased knowledge of use of DME, and UE functional use, cognitive skills including  and psychosocial skills including coping strategies, environmental adaptation, habits, interpersonal interactions, and routines and behaviors.   IMPAIRMENTS: are limiting patient from ADLs, IADLs, rest and sleep, play, leisure, and social participation.   CO-MORBIDITIES: may have co-morbidities  that affects occupational performance. Patient will benefit from skilled OT to address above impairments and improve overall function.  MODIFICATION OR ASSISTANCE TO COMPLETE EVALUATION: No modification of tasks or assist necessary to  complete an evaluation.  OT OCCUPATIONAL PROFILE AND HISTORY: Detailed assessment: Review of records and additional review of physical, cognitive, psychosocial history related to current functional performance.  CLINICAL DECISION MAKING: LOW - limited treatment options, no task modification necessary  REHAB POTENTIAL: Good  EVALUATION COMPLEXITY: Low    PLAN:  OT FREQUENCY:1-2x week  OT DURATION: 9 weeks  PLANNED INTERVENTIONS: self care/ADL training, therapeutic exercise, therapeutic activity, neuromuscular re-education, manual therapy, passive range of motion, gait training, balance training, stair training, functional mobility training, aquatic therapy, ultrasound, paraffin, moist heat, cryotherapy, patient/family education, energy conservation, coping strategies training, DME and/or AE instructions, and Re-evaluation  RECOMMENDED OTHER SERVICES: PT  CONSULTED AND AGREED WITH PLAN OF CARE: Patient and family member/caregiver  PLAN FOR NEXT SESSION: continue to  work towards updated goals, coordination, standing tolerance for ADLs,   Gonsalo Cuthbertson, OT 07/22/2023, 11:32 AM          3

## 2023-07-22 NOTE — Therapy (Signed)
OUTPATIENT PHYSICAL THERAPY LOWER EXTREMITY TREATMENT   Patient Name: Dana [name] MRN: [id number] DOB:1966-09-12, 57 y.o., female Today's Date: 07/22/2023  END OF SESSION:  PT End of Session - 07/22/23 0855     Visit Number 26    Date for PT Re-Evaluation 08/19/23    PT Start Time 0846    PT Stop Time 0925    PT Time Calculation (min) 39 min    Equipment Utilized During Treatment Gait belt    Activity Tolerance Patient tolerated treatment well    Behavior During Therapy [facility] for tasks assessed/performed             Past Medical History:  Diagnosis Date   Anxiety    Back pain with radiation    Breast mass, right    Chronic pain    Chronic, continuous use of opioids    Complication of anesthesia 04/07/14   Allergic reaction to Lisinopril immediately following surgery   DJD (degenerative joint disease)    Fibromyalgia    Groin abscess    Headache(784.0)    History of IBS    Hypercholesterolemia    IBS (irritable bowel syndrome)    Lactose intolerance    Mild hypertension    Obesity    Tobacco use disorder    Umbilical hernia    Symptomatic   Past Surgical History:  Procedure Laterality Date   ABDOMINAL HYSTERECTOMY     ANTERIOR CERVICAL DECOMP/DISCECTOMY FUSION N/A 12/20/2015   Procedure: ANTERIOR CERVICAL DECOMPRESSION FUSION CERVICAL 4-5, CERVICAL 5-6, CERVICAL 6-7 WITH INSTRUMENTATION AND ALLOGRAFT;  Surgeon: Estill Bamberg, MD;  Location: [facility] OR;  Service: Orthopedics;  Laterality: N/A;  Anterior cervical decompression fusion, cervical 4-5, cervical 5-6, cervical 6-7 with instrumentation and allograft   BACK SURGERY     BILATERAL SALPINGECTOMY  09/03/2012   Procedure: BILATERAL SALPINGECTOMY;  Surgeon: Ok Edwards, MD;  Location: [facility] ORS;  Service: Gynecology;  Laterality: Bilateral;   BREAST BIOPSY Right 04/07/2014   Procedure: REMOVAL RIGHT BREAST MASS WITH WIRE LOCALIZATION;  Surgeon: Adolph Pollack, MD;  Location: [facility] OR;  Service: General;   Laterality: Right;   BREAST EXCISIONAL BIOPSY Left    x2   BREAST LUMPECTOMY     x2   CARPAL TUNNEL RELEASE Left 05/11/2019   Procedure: LEFT CARPAL TUNNEL RELEASE, RIGHT TENNIS ELBOW MARCAINE/DEPO MEDROL INJECTION UNDER ANESTHESIA;  Surgeon: Kerrin Champagne, MD;  Location: [facility] OR;  Service: Orthopedics;  Laterality: Left;   COLONOSCOPY W/ BIOPSIES  04/24/2012   per Dr. Leone Payor, clear, repeat in 10 yrs    disectomy     ESOPHAGOGASTRODUODENOSCOPY     FINGER SURGERY     Right index-excision of mass    FOOT SURGERY Right    Bone Spurs   IR FLUORO GUIDE CV LINE RIGHT  01/29/2023   IR REMOVAL TUN CV CATH W/O FL  02/08/2023   IR US GUIDE VASC ACCESS RIGHT  01/31/2023   LAPAROSCOPIC HYSTERECTOMY  09/03/2012   Procedure: HYSTERECTOMY TOTAL LAPAROSCOPIC;  Surgeon: Ok Edwards, MD;  Location: [facility] ORS;  Service: Gynecology;  Laterality: N/A;   LUMBAR DISC SURGERY     TUBAL LIGATION     UMBILICAL HERNIA REPAIR N/A 05/01/2018   Procedure: UMBILICAL HERNIA REPAIR;  Surgeon: Jimmye Norman, MD;  Location: [facility] OR;  Service: General;  Laterality: N/A;   Patient Active Problem List   Diagnosis Date Noted   CIDP (chronic inflammatory demyelinating polyneuropathy) (HCC) 01/28/2023   Gait abnormality 01/28/2023   Glaucoma  01/28/2023   Pain due to onychomycosis of toenails of both feet 01/21/2023   Wheelchair dependence 12/30/2022   Nerve pain 12/30/2022   Insomnia due to medical condition 12/30/2022   Acute inflammatory demyelinating polyneuropathy (HCC) 10/01/2022   UTI (urinary tract infection) 09/24/2022   Hypomagnesemia 09/24/2022   Weakness 09/23/2022   Hypokalemia 09/23/2022   B12 deficiency 09/05/2022   Folate deficiency 09/05/2022   Carpal tunnel syndrome, left upper limb 05/11/2019    Class: Chronic   Spinal stenosis of lumbar region with neurogenic claudication 08/20/2018   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   Primary osteoarthritis of right knee 02/27/2017   Right tennis elbow  11/11/2016   Muscle cramps 01/15/2016   Bilateral leg edema 01/08/2016   Radiculopathy 12/20/2015   Hyperglycemia 12/21/2014   Mastodynia, female 11/30/2014   S/P excision of fibroadenoma of breast 11/30/2014   Angioedema of lips 04/07/2014   Fibroadenoma of right breast 03/07/2014   Pelvic pain in female 06/19/2012   Menorrhagia 06/11/2012   SUI (stress urinary incontinence, female) 06/11/2012   HTN (hypertension) 06/11/2012   IBS (irritable bowel syndrome) - diarrhea predominant 03/17/2012   MICROSCOPIC HEMATURIA 11/30/2010   BREAST PAIN, RIGHT 11/30/2010   BREAST MASS, RIGHT 01/12/2010   HYPERCHOLESTEROLEMIA 12/07/2007   CIGARETTE SMOKER 12/07/2007   Osteoarthritis 12/07/2007   Low back pain with sciatica 12/07/2007   Headache 12/07/2007   Obesity, Class III, BMI 40-49.9 (morbid obesity) (HCC) 08/03/2007   Anxiety state 08/03/2007    PCP: Nelwyn Salisbury, MD  REFERRING PROVIDER: Nelwyn Salisbury, MD  REFERRING DIAG: G61.0 (ICD-10-CM) - Guillain Barr syndrome [facility])   THERAPY DIAG:  Muscle weakness (generalized)  Other lack of coordination  Unsteadiness on feet  Other abnormalities of gait and mobility  CIDP (chronic inflammatory demyelinating polyneuropathy) (HCC)  Difficulty in walking, not elsewhere classified  Rationale for Evaluation and Treatment: Rehabilitation  ONSET DATE: 12/05/22  SUBJECTIVE:   SUBJECTIVE STATEMENT: Patient reports several issues last week. Her R hand is severely sore to pressure at the base of the fifth metacarpal. She is still having the SOB with exertion. She also had difficulties with her last treatment. Her veins collapsed and she could not complete the full treatment. She will try again tomorrow.  OBJECTIVE STATEMENT: 01/28/23 ASSESSMENT AND PLAN   Dana [name] is a 57 y.o. female   Demyelinating polyradiculoneuropathy             Symptom onset monophasic since September 2024, failed to respond to IVIG in January 2024,              EMG nerve conduction study showed demyelinating features, no evidence of axonal loss,             Talk with neurohospitalist Dr. Iver Nestle, ED triage, will send her for hospital admission for plasma exchange, please add on following labs, immunofixative protein electrophoresis, ANA with reflex, SSA, SSB titer, iron panel             Starting outpatient IVIG prior authorization process,  MARYNELL BIES is a 57 y.o. female who presented to the [facility] ED on 09/23/2022 with bilateral lower extremity weakness with decreased mobility that has progressed to both arms. She also reported numbness and tingling of the extremities. She was transferred to [facility] for MRI. 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  Inpatient Rehab F/B HHPT.  R knee injection 10/08/22 trigger point injections for worsening back pain 2/15  RA  PAIN:  Are  you having pain? Yes: NPRS scale: up to 10/10 Pain location: all over, but her R knee pain is worst,  Pain description: Sharp Aggravating factors: Has difficulty standing up Relieving factors: Medicine helps somewhat  PRECAUTIONS: Fall  WEIGHT BEARING RESTRICTIONS: No  FALLS:  Has patient fallen in last 6 months? No  LIVING ENVIRONMENT: Lives with: lives with their family Lives in: House/apartment Stairs: Nohas a ramp Has following equipment at home: Environmental consultant - 2 wheeled, Wheelchair (power), Tour manager, bed side commode, Ramped entry, and [employer] lift  OCCUPATION: on disability due to RA and a fall several years ago  PLOF: Independent  PATIENT GOALS: walk  NEXT MD VISIT: She is scheduled to follow up with Dr. Serita Sheller with Physical Med/Rehab on 12-30-22. She is scheduled to follow up with Dr. Levert Feinstein at Neurology on 01-28-23.   OBJECTIVE:   DIAGNOSTIC FINDINGS:  Right knee radiographs 11/09/2020   FINDINGS: Severe patellofemoral joint space narrowing. Severe superior and mild inferior patellar degenerative osteophytosis. Moderate  superior trochlear degenerative osteophytosis. No joint effusion. Severe medial compartment joint space narrowing with moderate peripheral medial and lateral compartment degenerative osteophytes.  COGNITION: Overall cognitive status: Within functional limits for tasks assessed     SENSATION: Light touch: Impaired  and B hands and feet and lower legs  EDEMA:  BLE edema noted.  MUSCLE LENGTH: Hamstrings: WFL Thomas test: B tightness, to neutral  POSTURE: rounded shoulders and flexed trunk   PALPATION: No TTP  LOWER EXTREMITY ROM: BLE ROM limited in all planes an all joints due to body habitus   LOWER EXTREMITY MMT:  MMT Right eval Left eval R 05/01/23 L 05/01/23 R/L 06/05/23  Hip flexion 3 3- 4- 3+ 4/4  Hip extension 3- 3- 3 3 3   Hip abduction 3 3- 3+ 3+ 4-/3+  Hip adduction       Hip internal rotation       Hip external rotation       Knee flexion 4 4- 4 4 4+/4+  Knee extension 4- 4- 4 4 4+/4+  Ankle dorsiflexion 4- 4- 4 4 4/4  Ankle plantarflexion       Ankle inversion 4- 4-     Ankle eversion 4- 4-      (Blank rows = not tested)  FUNCTIONAL TESTS:  5 times sit to stand: unable to complete Timed up and go (TUG): unable to complete,  04/22/23  TUG 63 seconds with FWW  Bed Mobility: Occasional min A for sup <> sit, rolled B on mat with great effort, light min A  TRANSFERS: Scooting transfer with CGA, stand pivot with CGA, she reports that she sometimes has difficulty rising due to her knee pain  GAIT: Distance walked: 0' Assistive device utilized:  shopping cart-will bring her RW from home. Level of assistance: Min A Comments: Stood and weight shifted, then stood again and took steps to turn to W/C.  TODAY'S TREATMENT:  DATE:  07/22/23 NuStep L5 x 6 minutes Goals re-assessed for [employer], 2 x 10 with 2 blue bands, each  leg. Alternating step taps on 4" step with BUE support on RW, but trying to minimize UE support, CGA Standing heel raises x 10, B. Standing step back and around, reach back, then return to center. X 8 each direction Walk x 5' to W/C using RW and CGA.  07/01/23 NuStep L5 x 6 minutes- attempted bike, but she reported severe calf pain. Activity stress test performed due to patient's reports of severe SOB and racing heart and overheating with gait activities.  NuStep, progressive WATTS. HR, SATS, RPE assessed each 2 minutes. Rest- SATS/HR/RPE 100/91 2 min at 25-30 W 98/10/3 2 min at 35-40 W 98/111/5 2 min at 35-40 W 97/114/10 stopped Ambulation x 25' with RW SATS/HR 100/117  06/26/23 NuStep L4 x 6 minutes Alternately tapping 6" step with BUE support on RW, attempting to decrease reliance on UE, 15 each leg, more challenging standing on R. Standing B weight shifts over BOS, then quick step taps to each side. Performed balance training- Step forward with RLE, reach to pick up cone, then step back and turn R leg out, with weight shift back onto the R leg, reaching to place on mat. Repeated x 6 each direction, no UE support. B side stepping on airex pad in parallel bars x 1, challenging.  06/05/23 NuStep L5 x 6 minutes  05/13/23 NuStep L5 x 6 minutes Stand pivot transfers to and from the NuStep using cane with multiple small steps each time. Standing mini squats while holding 2# ball at chest, 2 x 5 reps Seated rotational walk outs, with opposite leg ext, 5 reps to each side Side step x 5 feet, using SPC and HHA,   05/08/23 NuStep L5 x 4 minutes, then 3 x 30 sec with increased resistance, L7 B side stepping in front of mat using SPC, CGA, 7 steps each way B lateral lunge/return x 8 each side, SPC Stepped to W/C with SPC x 5', CGA  05/01/23 NuStep L5 x 5 min then 2 x 30 sec at L7 for power. Strength re-assessment for PN Bed mob MI, Transfers MI Ambulated 1 x 40' with RW and S, improved  stability.  04/22/23 Nustep level 3 x 8 minutes TUG 63 seconds with FWW Leg curls 20# Seated 5# rows and 5# extensions with pulleys Ball b/n knees squeeze Green tband clamshells Seated marches 3x10 3# LAQ 3x10 Ball in lap isometric abs Walking with CGA using FWW x 50 feet  04/08/23 NuStep L3 x 6 minutes Standing on Airex in parallel bars, performed B and ant/post weight shifts over feet, hands hovering over the bar. Stable. B side stepping in parallel bars with Red Tband positioned just above knees to add resistance to hip abd. 2 x 5 each way. Seated W/C push ups x 8 Seated reaching to each side, outside BOS x 5 each.  04/01/23 Nustep level 5 x 8 minutes TUG 65 seconds with FWW and SBA Seated ball b/n knees squeeze Red tband left hip adduction to isolate Seated marches In [employer], hip abduction and extension, some right knee pain with right leg extension In pbars practicing big steps In pbars sides stepping Pball isometric abs Leg curls 20# 2x10 Leg extension small ROM 5# 2x10 Ambulate FWW CGA 2x 20'  PATIENT EDUCATION:  Education details: POC Person educated: Patient Education method: Explanation Education comprehension: verbalized understanding  HOME EXERCISE PROGRAM: Geophysical data processor  ASSESSMENT:  CLINICAL IMPRESSION: Patient reports improved SOB, but she also reports new onset of R hand pain at the base of the fifth metacarpal. Painful with pressure. Functional status re-assessed for UPOC. She has progressed in spite of the new issues, but continues to demonstrate some deficits. She will benefit from PT to address her strength, balance, and functional mobility.  OBJECTIVE IMPAIRMENTS: Abnormal gait, decreased activity tolerance, decreased balance, decreased coordination, decreased endurance, difficulty walking, decreased ROM, decreased strength, and postural dysfunction.   ACTIVITY LIMITATIONS: carrying, lifting, bending, standing, squatting, stairs, transfers, and  locomotion level  PARTICIPATION LIMITATIONS: meal prep, cleaning, laundry, driving, and shopping  PERSONAL FACTORS: Fitness, Past/current experiences, and 1 comorbidity: Recently diagnosed with GBS  are also affecting patient's functional outcome.   REHAB POTENTIAL: Good  CLINICAL DECISION MAKING: Evolving/moderate complexity  EVALUATION COMPLEXITY: Moderate  GOALS: Goals reviewed with patient? Yes  SHORT TERM GOALS: Target date: 11/28/22 I with initial HEP Baseline: Goal status: met 01/09/23  LONG TERM GOALS: Target date: 03/06/23  I with final HEP Baseline:  Goal status: 06/05/23, ongoing  2.  Decrease 5x STS to < 14 sec Baseline: unable to complete Goal status: 07/21/23-22, ongoing  3.  Decrease TUG to < 20 sec Baseline: Unable to complete. Goal status: 02/18/23 1:55 04/01/23 = 65 seconds, 07/21/23-40.3 sec, ongoing   4.  Patient will ambulate at least 300' with LRAD, MI, on level and unlevel surfaces. Baseline: Stand pivot transfer with CGA Goal status: 02/18/23-20', RW, CGA, slow, but stable throughout. 06/05/23-100', RW, ongoing, 07/22/23- N/T for distance due to R hand pain when using RW.  5.  Perform all bed mobility with I Baseline: Occ min A on mat Goal status: 02/18/23-met  6.  Increase BLE strength to at least 4/5 throughout. Baseline: (3-)-3/5 Goal status: 06/05/23-See MMT, ongoing. 07/22/23-4-/5, ongoing  7. Patient and husband will safely perform car transfers with LRAD Baseline: Currently uses van transportation Goal Status: 02/25/23 met  PLAN:  PT FREQUENCY: 1-2x/week  PT DURATION: 12 weeks  PLANNED INTERVENTIONS: Therapeutic exercises, Therapeutic activity, Neuromuscular re-education, Balance training, Gait training, Patient/Family education, Self Care, Joint mobilization, Stair training, Dry Needling, Electrical stimulation, Cryotherapy, Moist heat, Ultrasound, Ionotophoresis 4mg /ml Dexamethasone, and Manual therapy  PLAN FOR NEXT SESSION: Would like to  continue to push her strength, gait and overall function.    Oley Balm DPT 07/22/23 9:37 AM

## 2023-07-24 ENCOUNTER — Ambulatory Visit: Payer: 59 | Admitting: Occupational Therapy

## 2023-07-24 ENCOUNTER — Encounter: Payer: Self-pay | Admitting: Physical Therapy

## 2023-07-24 ENCOUNTER — Ambulatory Visit: Payer: 59 | Admitting: Physical Therapy

## 2023-07-24 DIAGNOSIS — M6281 Muscle weakness (generalized): Secondary | ICD-10-CM | POA: Diagnosis not present

## 2023-07-24 DIAGNOSIS — R2681 Unsteadiness on feet: Secondary | ICD-10-CM

## 2023-07-24 DIAGNOSIS — R262 Difficulty in walking, not elsewhere classified: Secondary | ICD-10-CM

## 2023-07-24 DIAGNOSIS — R2689 Other abnormalities of gait and mobility: Secondary | ICD-10-CM

## 2023-07-24 DIAGNOSIS — G6181 Chronic inflammatory demyelinating polyneuritis: Secondary | ICD-10-CM

## 2023-07-24 DIAGNOSIS — R278 Other lack of coordination: Secondary | ICD-10-CM

## 2023-07-24 NOTE — Therapy (Signed)
OUTPATIENT PHYSICAL THERAPY LOWER EXTREMITY TREATMENT   Patient Name: Dana [name] MRN: [id number] DOB:[birth year], 57 y.o., female Today's Date: 07/24/2023  END OF SESSION:  PT End of Session - 07/24/23 0848     Visit Number 27    Date for PT Re-Evaluation 08/19/23    PT Start Time 0840    PT Stop Time 0920    PT Time Calculation (min) 40 min    Equipment Utilized During Treatment Gait belt    Activity Tolerance Patient tolerated treatment well    Behavior During Therapy [facility] for tasks assessed/performed              Past Medical History:  Diagnosis Date   Anxiety    Back pain with radiation    Breast mass, right    Chronic pain    Chronic, continuous use of opioids    Complication of anesthesia 04/07/14   Allergic reaction to Lisinopril immediately following surgery   DJD (degenerative joint disease)    Fibromyalgia    Groin abscess    Headache(784.0)    History of IBS    Hypercholesterolemia    IBS (irritable bowel syndrome)    Lactose intolerance    Mild hypertension    Obesity    Tobacco use disorder    Umbilical hernia    Symptomatic   Past Surgical History:  Procedure Laterality Date   ABDOMINAL HYSTERECTOMY     ANTERIOR CERVICAL DECOMP/DISCECTOMY FUSION N/A 12/20/2015   Procedure: ANTERIOR CERVICAL DECOMPRESSION FUSION CERVICAL 4-5, CERVICAL 5-6, CERVICAL 6-7 WITH INSTRUMENTATION AND ALLOGRAFT;  Surgeon: Estill Bamberg, MD;  Location: [facility] OR;  Service: Orthopedics;  Laterality: N/A;  Anterior cervical decompression fusion, cervical 4-5, cervical 5-6, cervical 6-7 with instrumentation and allograft   BACK SURGERY     BILATERAL SALPINGECTOMY  09/03/2012   Procedure: BILATERAL SALPINGECTOMY;  Surgeon: Ok Edwards, MD;  Location: [facility];  Service: Gynecology;  Laterality: Bilateral;   BREAST BIOPSY Right 04/07/2014   Procedure: REMOVAL RIGHT BREAST MASS WITH WIRE LOCALIZATION;  Surgeon: Adolph Pollack, MD;  Location: [facility] OR;  Service: General;   Laterality: Right;   BREAST EXCISIONAL BIOPSY Left    x2   BREAST LUMPECTOMY     x2   CARPAL TUNNEL RELEASE Left 05/11/2019   Procedure: LEFT CARPAL TUNNEL RELEASE, RIGHT TENNIS ELBOW MARCAINE/DEPO MEDROL INJECTION UNDER ANESTHESIA;  Surgeon: Kerrin Champagne, MD;  Location: [facility] OR;  Service: Orthopedics;  Laterality: Left;   COLONOSCOPY W/ BIOPSIES  04/24/2012   per Dr. Leone Payor, clear, repeat in 10 yrs    disectomy     ESOPHAGOGASTRODUODENOSCOPY     FINGER SURGERY     Right index-excision of mass    FOOT SURGERY Right    Bone Spurs   IR FLUORO GUIDE CV LINE RIGHT  01/29/2023   IR REMOVAL TUN CV CATH W/O FL  02/08/2023   IR US GUIDE VASC ACCESS RIGHT  01/31/2023   LAPAROSCOPIC HYSTERECTOMY  09/03/2012   Procedure: HYSTERECTOMY TOTAL LAPAROSCOPIC;  Surgeon: Ok Edwards, MD;  Location: [facility];  Service: Gynecology;  Laterality: N/A;   LUMBAR DISC SURGERY     TUBAL LIGATION     UMBILICAL HERNIA REPAIR N/A 05/01/2018   Procedure: UMBILICAL HERNIA REPAIR;  Surgeon: Jimmye Norman, MD;  Location: [facility];  Service: General;  Laterality: N/A;   Patient Active Problem List   Diagnosis Date Noted   CIDP (chronic inflammatory demyelinating polyneuropathy) (HCC) 01/28/2023   Gait abnormality 01/28/2023  Glaucoma 01/28/2023   Pain due to onychomycosis of toenails of both feet 01/21/2023   Wheelchair dependence 12/30/2022   Nerve pain 12/30/2022   Insomnia due to medical condition 12/30/2022   Acute inflammatory demyelinating polyneuropathy (HCC) 10/01/2022   UTI (urinary tract infection) 09/24/2022   Hypomagnesemia 09/24/2022   Weakness 09/23/2022   Hypokalemia 09/23/2022   B12 deficiency 09/05/2022   Folate deficiency 09/05/2022   Carpal tunnel syndrome, left upper limb 05/11/2019    Class: Chronic   Spinal stenosis of lumbar region with neurogenic claudication 08/20/2018   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   Primary osteoarthritis of right knee 02/27/2017   Right tennis elbow  11/11/2016   Muscle cramps 01/15/2016   Bilateral leg edema 01/08/2016   Radiculopathy 12/20/2015   Hyperglycemia 12/21/2014   Mastodynia, female 11/30/2014   S/P excision of fibroadenoma of breast 11/30/2014   Angioedema of lips 04/07/2014   Fibroadenoma of right breast 03/07/2014   Pelvic pain in female 06/19/2012   Menorrhagia 06/11/2012   SUI (stress urinary incontinence, female) 06/11/2012   HTN (hypertension) 06/11/2012   IBS (irritable bowel syndrome) - diarrhea predominant 03/17/2012   MICROSCOPIC HEMATURIA 11/30/2010   BREAST PAIN, RIGHT 11/30/2010   BREAST MASS, RIGHT 01/12/2010   HYPERCHOLESTEROLEMIA 12/07/2007   CIGARETTE SMOKER 12/07/2007   Osteoarthritis 12/07/2007   Low back pain with sciatica 12/07/2007   Headache 12/07/2007   Obesity, Class III, BMI 40-49.9 (morbid obesity) (HCC) 08/03/2007   Anxiety state 08/03/2007    PCP: Nelwyn Salisbury, MD  REFERRING PROVIDER: Nelwyn Salisbury, MD  REFERRING DIAG: G61.0 (ICD-10-CM) - Guillain Barr syndrome [facility])   THERAPY DIAG:  Muscle weakness (generalized)  Other lack of coordination  Unsteadiness on feet  CIDP (chronic inflammatory demyelinating polyneuropathy) (HCC)  Difficulty in walking, not elsewhere classified  Rationale for Evaluation and Treatment: Rehabilitation  ONSET DATE: 12/05/22  SUBJECTIVE:   SUBJECTIVE STATEMENT: Patient reports continued pain in her hand. The pain keeps her awake at night. She has an appointment scheduled for 11/20, but is on the cancellation list as well. She was able to complete her infusion treatment.   OBJECTIVE STATEMENT: 01/28/23 ASSESSMENT AND PLAN   Dana [name] is a 57 y.o. female   Demyelinating polyradiculoneuropathy             Symptom onset monophasic since September 2024, failed to respond to IVIG in January 2024,             EMG nerve conduction study showed demyelinating features, no evidence of axonal loss,             Talk with neurohospitalist Dr.  Iver Nestle, ED triage, will send her for hospital admission for plasma exchange, please add on following labs, immunofixative protein electrophoresis, ANA with reflex, SSA, SSB titer, iron panel             Starting outpatient IVIG prior authorization process,  Dana [name] is a 57 y.o. female who presented to the [facility] ED on 09/23/2022 with bilateral lower extremity weakness with decreased mobility that has progressed to both arms. She also reported numbness and tingling of the extremities. She was transferred to [facility] for MRI. 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  Inpatient Rehab F/B HHPT.  R knee injection 10/08/22 trigger point injections for worsening back pain 2/15  RA  PAIN:  Are you having pain? Yes: NPRS scale: up to 10/10 Pain location: all over, but her R knee pain is worst,  Pain description: Sharp Aggravating factors: Has difficulty standing up Relieving factors: Medicine helps somewhat  PRECAUTIONS: Fall  WEIGHT BEARING RESTRICTIONS: No  FALLS:  Has patient fallen in last 6 months? No  LIVING ENVIRONMENT: Lives with: lives with their family Lives in: House/apartment Stairs: [place]oh[place] a ramp Has following equipment at home: Environmental consultant - 2 wheeled, Wheelchair (power), Tour manager, bed side commode, Ramped entry, and [employer] lift  OCCUPATION: on disability due to RA and a fall several years ago  PLOF: Independent  PATIENT GOALS: walk  NEXT MD VISIT: She is scheduled to follow up with Dr. Serita Sheller with Physical Med/Rehab on 12-30-22. She is scheduled to follow up with Dr. Levert Feinstein at Neurology on 01-28-23.   OBJECTIVE:   DIAGNOSTIC FINDINGS:  Right knee radiographs 11/09/2020   FINDINGS: Severe patellofemoral joint space narrowing. Severe superior and mild inferior patellar degenerative osteophytosis. Moderate superior trochlear degenerative osteophytosis. No joint effusion. Severe medial compartment joint space narrowing with moderate  peripheral medial and lateral compartment degenerative osteophytes.  COGNITION: Overall cognitive status: Within functional limits for tasks assessed     SENSATION: Light touch: Impaired  and B hands and feet and lower legs  EDEMA:  BLE edema noted.  MUSCLE LENGTH: Hamstrings: WFL Thomas test: B tightness, to neutral  POSTURE: rounded shoulders and flexed trunk   PALPATION: No TTP  LOWER EXTREMITY ROM: BLE ROM limited in all planes an all joints due to body habitus   LOWER EXTREMITY MMT:  MMT Right eval Left eval R 05/01/23 L 05/01/23 R/L 06/05/23  Hip flexion 3 3- 4- 3+ 4/4  Hip extension 3- 3- 3 3 3   Hip abduction 3 3- 3+ 3+ 4-/3+  Hip adduction       Hip internal rotation       Hip external rotation       Knee flexion 4 4- 4 4 4+/4+  Knee extension 4- 4- 4 4 4+/4+  Ankle dorsiflexion 4- 4- 4 4 4/4  Ankle plantarflexion       Ankle inversion 4- 4-     Ankle eversion 4- 4-      (Blank rows = not tested)  FUNCTIONAL TESTS:  5 times sit to stand: unable to complete Timed up and go (TUG): unable to complete,  04/22/23  TUG 63 seconds with FWW  Bed Mobility: Occasional min A for sup <> sit, rolled B on mat with great effort, light min A  TRANSFERS: Scooting transfer with CGA, stand pivot with CGA, she reports that she sometimes has difficulty rising due to her knee pain  GAIT: Distance walked: 0' Assistive device utilized:  shopping cart-will bring her RW from home. Level of assistance: Min A Comments: Stood and weight shifted, then stood again and took steps to turn to W/C.  TODAY'S TREATMENT:  DATE:  07/23/24 NuStep L5 x 6 minutes Seated rotational reach to the side, 2 x 5 each way. Mini squats while holding 4# ball, 2 x 5 reps B side step with weight shift and return to center, 2 x 6 reps with RW and CGA. Seated long kicks with 3# on  each leg 2 x 10 reps each Stand pivot transfers x 4 with RW and S.  07/22/23 NuStep L5 x 6 minutes Goals re-assessed for [employer], 2 x 10 with 2 blue bands, each leg. Alternating step taps on 4" step with BUE support on RW, but trying to minimize UE support, CGA Standing heel raises x 10, B. Standing step back and around, reach back, then return to center. X 8 each direction Walk x 5' to W/C using RW and CGA.  07/01/23 NuStep L5 x 6 minutes- attempted bike, but she reported severe calf pain. Activity stress test performed due to patient's reports of severe SOB and racing heart and overheating with gait activities.  NuStep, progressive WATTS. HR, SATS, RPE assessed each 2 minutes. Rest- SATS/HR/RPE 100/91 2 min at 25-30 W 98/10/3 2 min at 35-40 W 98/111/5 2 min at 35-40 W 97/114/10 stopped Ambulation x 25' with RW SATS/HR 100/117  06/26/23 NuStep L4 x 6 minutes Alternately tapping 6" step with BUE support on RW, attempting to decrease reliance on UE, 15 each leg, more challenging standing on R. Standing B weight shifts over BOS, then quick step taps to each side. Performed balance training- Step forward with RLE, reach to pick up cone, then step back and turn R leg out, with weight shift back onto the R leg, reaching to place on mat. Repeated x 6 each direction, no UE support. B side stepping on airex pad in parallel bars x 1, challenging.  PATIENT EDUCATION:  Education details: POC Person educated: Patient Education method: Explanation Education comprehension: verbalized understanding  HOME EXERCISE PROGRAM: [id number]  ASSESSMENT:  CLINICAL IMPRESSION: Patient reports continued pain in her hand, keeping her awake at night. Treatment focused on trunk and LE strengthening to reduce pressure on her hand. She also reported L lower back strain, which impeded her participation a bit, but she pushed through activities as much as possible.  OBJECTIVE IMPAIRMENTS: Abnormal  gait, decreased activity tolerance, decreased balance, decreased coordination, decreased endurance, difficulty walking, decreased ROM, decreased strength, and postural dysfunction.   ACTIVITY LIMITATIONS: carrying, lifting, bending, standing, squatting, stairs, transfers, and locomotion level  PARTICIPATION LIMITATIONS: meal prep, cleaning, laundry, driving, and shopping  PERSONAL FACTORS: Fitness, Past/current experiences, and 1 comorbidity: Recently diagnosed with GBS  are also affecting patient's functional outcome.   REHAB POTENTIAL: Good  CLINICAL DECISION MAKING: Evolving/moderate complexity  EVALUATION COMPLEXITY: Moderate  GOALS: Goals reviewed with patient? Yes  SHORT TERM GOALS: Target date: 11/28/22 I with initial HEP Baseline: Goal status: met 01/09/23  LONG TERM GOALS: Target date: 03/06/23  I with final HEP Baseline:  Goal status: 06/05/23, ongoing  2.  Decrease 5x STS to < 14 sec Baseline: unable to complete Goal status: 07/21/23-22, ongoing  3.  Decrease TUG to < 20 sec Baseline: Unable to complete. Goal status: 02/18/23 1:55 04/01/23 = 65 seconds, 07/21/23-40.3 sec, ongoing   4.  Patient will ambulate at least 300' with LRAD, MI, on level and unlevel surfaces. Baseline: Stand pivot transfer with CGA Goal status: 02/18/23-20', RW, CGA, slow, but stable throughout. 06/05/23-100', RW, ongoing, 07/22/23- N/T for distance due to R hand pain when using RW.  5.  Perform all bed mobility with I Baseline: Occ min A on mat Goal status: 02/18/23-met  6.  Increase BLE strength to at least 4/5 throughout. Baseline: (3-)-3/5 Goal status: 06/05/23-See MMT, ongoing. 07/22/23-4-/5, ongoing  7. Patient and husband will safely perform car transfers with LRAD Baseline: Currently uses van transportation Goal Status: 02/25/23 met  PLAN:  PT FREQUENCY: 1-2x/week  PT DURATION: 12 weeks  PLANNED INTERVENTIONS: Therapeutic exercises, Therapeutic activity, Neuromuscular re-education,  Balance training, Gait training, Patient/Family education, Self Care, Joint mobilization, Stair training, Dry Needling, Electrical stimulation, Cryotherapy, Moist heat, Ultrasound, Ionotophoresis 4mg /ml Dexamethasone, and Manual therapy  PLAN FOR NEXT SESSION: Would like to continue to push her strength, gait and overall function.    Oley Balm DPT 07/24/23 9:26 AM

## 2023-07-24 NOTE — Therapy (Signed)
OUTPATIENT OCCUPATIONAL THERAPY NEURO TREATMENT   Patient Name: Dana [name] MRN: [id number] DOB:1965-10-06, 57 y.o., female Today's Date: 07/24/2023  PCP: Dr. Clent Ridges REFERRING PROVIDER: Dr. Gershon Crane  END OF SESSION:  OT End of Session - 07/24/23 1208     Visit Number 29    Number of Visits 40    Date for OT Re-Evaluation 09/23/23    Authorization Type UHC, Medicare    Authorization Time Period KX    Authorization - Visit Number 32    Progress Note Due on Visit 33    OT Start Time 0930                          Past Medical History:  Diagnosis Date   Anxiety    Back pain with radiation    Breast mass, right    Chronic pain    Chronic, continuous use of opioids    Complication of anesthesia 04/07/14   Allergic reaction to Lisinopril immediately following surgery   DJD (degenerative joint disease)    Fibromyalgia    Groin abscess    Headache(784.0)    History of IBS    Hypercholesterolemia    IBS (irritable bowel syndrome)    Lactose intolerance    Mild hypertension    Obesity    Tobacco use disorder    Umbilical hernia    Symptomatic   Past Surgical History:  Procedure Laterality Date   ABDOMINAL HYSTERECTOMY     ANTERIOR CERVICAL DECOMP/DISCECTOMY FUSION N/A 12/20/2015   Procedure: ANTERIOR CERVICAL DECOMPRESSION FUSION CERVICAL 4-5, CERVICAL 5-6, CERVICAL 6-7 WITH INSTRUMENTATION AND ALLOGRAFT;  Surgeon: Estill Bamberg, MD;  Location: [facility] OR;  Service: Orthopedics;  Laterality: N/A;  Anterior cervical decompression fusion, cervical 4-5, cervical 5-6, cervical 6-7 with instrumentation and allograft   BACK SURGERY     BILATERAL SALPINGECTOMY  09/03/2012   Procedure: BILATERAL SALPINGECTOMY;  Surgeon: Ok Edwards, MD;  Location: [facility];  Service: Gynecology;  Laterality: Bilateral;   BREAST BIOPSY Right 04/07/2014   Procedure: REMOVAL RIGHT BREAST MASS WITH WIRE LOCALIZATION;  Surgeon: Adolph Pollack, MD;  Location: [facility] OR;  Service: General;   Laterality: Right;   BREAST EXCISIONAL BIOPSY Left    x2   BREAST LUMPECTOMY     x2   CARPAL TUNNEL RELEASE Left 05/11/2019   Procedure: LEFT CARPAL TUNNEL RELEASE, RIGHT TENNIS ELBOW MARCAINE/DEPO MEDROL INJECTION UNDER ANESTHESIA;  Surgeon: Kerrin Champagne, MD;  Location: [facility] OR;  Service: Orthopedics;  Laterality: Left;   COLONOSCOPY W/ BIOPSIES  04/24/2012   per Dr. Leone Payor, clear, repeat in 10 yrs    disectomy     ESOPHAGOGASTRODUODENOSCOPY     FINGER SURGERY     Right index-excision of mass    FOOT SURGERY Right    Bone Spurs   IR FLUORO GUIDE CV LINE RIGHT  01/29/2023   IR REMOVAL TUN CV CATH W/O FL  02/08/2023   IR US GUIDE VASC ACCESS RIGHT  01/31/2023   LAPAROSCOPIC HYSTERECTOMY  09/03/2012   Procedure: HYSTERECTOMY TOTAL LAPAROSCOPIC;  Surgeon: Ok Edwards, MD;  Location: [facility];  Service: Gynecology;  Laterality: N/A;   LUMBAR DISC SURGERY     TUBAL LIGATION     UMBILICAL HERNIA REPAIR N/A 05/01/2018   Procedure: UMBILICAL HERNIA REPAIR;  Surgeon: Jimmye Norman, MD;  Location: [facility];  Service: General;  Laterality: N/A;   Patient Active Problem List   Diagnosis Date Noted  CIDP (chronic inflammatory demyelinating polyneuropathy) (HCC) 01/28/2023   Gait abnormality 01/28/2023   Glaucoma 01/28/2023   Pain due to onychomycosis of toenails of both feet 01/21/2023   Wheelchair dependence 12/30/2022   Nerve pain 12/30/2022   Insomnia due to medical condition 12/30/2022   Acute inflammatory demyelinating polyneuropathy (HCC) 10/01/2022   UTI (urinary tract infection) 09/24/2022   Hypomagnesemia 09/24/2022   Weakness 09/23/2022   Hypokalemia 09/23/2022   B12 deficiency 09/05/2022   Folate deficiency 09/05/2022   Carpal tunnel syndrome, left upper limb 05/11/2019    Class: Chronic   Spinal stenosis of lumbar region with neurogenic claudication 08/20/2018   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   Primary osteoarthritis of right knee 02/27/2017   Right tennis elbow  11/11/2016   Muscle cramps 01/15/2016   Bilateral leg edema 01/08/2016   Radiculopathy 12/20/2015   Hyperglycemia 12/21/2014   Mastodynia, female 11/30/2014   S/P excision of fibroadenoma of breast 11/30/2014   Angioedema of lips 04/07/2014   Fibroadenoma of right breast 03/07/2014   Pelvic pain in female 06/19/2012   Menorrhagia 06/11/2012   SUI (stress urinary incontinence, female) 06/11/2012   HTN (hypertension) 06/11/2012   IBS (irritable bowel syndrome) - diarrhea predominant 03/17/2012   MICROSCOPIC HEMATURIA 11/30/2010   BREAST PAIN, RIGHT 11/30/2010   BREAST MASS, RIGHT 01/12/2010   HYPERCHOLESTEROLEMIA 12/07/2007   CIGARETTE SMOKER 12/07/2007   Osteoarthritis 12/07/2007   Low back pain with sciatica 12/07/2007   Headache 12/07/2007   Obesity, Class III, BMI 40-49.9 (morbid obesity) (HCC) 08/03/2007   Anxiety state 08/03/2007    ONSET DATE: 12/05/22- referral date  REFERRING DIAG: G61.0 (ICD-10-CM) - Guillain Barr syndrome (HCC)  THERAPY DIAG:  Muscle weakness (generalized)  Other lack of coordination  Unsteadiness on feet  Other abnormalities of gait and mobility    Rationale for Evaluation and Treatment: Rehabilitation  SUBJECTIVE:   SUBJECTIVE STATEMENT: Pt reports her treatment went well yesterday PERTINENT HISTORY: Pt is a 57 y/o female hospitalized 09/23/22  with progressive weakness and sensation changes, consistent with Guillain-Barr syndrome. MRI negative. LP results and assessment most consistent with Guillain-Barr syndrome.  Pt was d/c home from CIR 11/06/22 PMH includes: glaucoma anxiety, DJD, fibromyalgia, HTN, tobacco use, B-12 deficiency, OA, ACDF 2017.  Pt was re-hospitalized 01/28/2456 with CIDP (chronic inflammatory demyelinating polyneuropathy). Saw Neuro on day of admission, NCS showed demyelination, referred to hospital for PLEX. Initially diagnosed with an acute demyelinating neuropathy Jan 2024, treated with IVIG. PMH: Spinal stenosis, HTN,  Obesity   PRECAUTIONS: Fall  WEIGHT BEARING RESTRICTIONS: no  PAIN:  Are you having pain? Yes: NPRS scale: 3-4/10 Pain location: RUE Pain description: aching, sharp  Aggravating factors: weightbearing Relieving factors: massage       FALLS: Has patient fallen in last 6 months? No  LIVING ENVIRONMENT: Lives with: lives with their spouse Lives in: House/apartment Stairs: has ramp built Has following equipment at home:   BSC, steadi,  tub bench but can't access bathroom   PLOF: Independent  PATIENT GOALS: increase independence   OBJECTIVE:   HAND DOMINANCE: Right  ADLs: from initial eval Overall ADLs: assistance required, unable to access BR Transfers/ambulation related to ADLs: Eating: unable to cut food Grooming: set up UB Dressing: set up for bra and shirt LB Dressing: max A Toileting: uses BSC uses steadi Bathing: min-mod A in hospital  bed  Tub Shower transfers: n/a Equipment: Emergency planning/management officer  IADLs:dependent for IADLS   MOBILITY STATUS:  uses w/c  , per PT report  minguard for transfer from w/c to mat    ACTIVITY TOLERANCE: Activity tolerance: Pt fatigues quickly    UPPER EXTREMITY ROM:  see updates for 02/18/23 re-eval   Active ROM Right 02/18/23 Left 02/18/23  Shoulder flexion 130 120  Shoulder abduction 90 90  Shoulder adduction    Shoulder extension    Shoulder internal rotation    Shoulder external rotation    Elbow flexion    Elbow extension  -5  Wrist flexion    Wrist extension    Wrist ulnar deviation    Wrist radial deviation    Wrist pronation    Wrist supination    (Blank rows = not tested)  UPPER EXTREMITY MMT:   see below for re-eval 02/18/23  MMT Right 02/18/23 Left 02/18/23  Shoulder flexion 3+/5 3+/5  Shoulder abduction    Shoulder adduction    Shoulder extension    Shoulder internal rotation    Shoulder external rotation    Middle trapezius    Lower trapezius    Elbow flexion 4/5 4/5  Elbow extension 4-/5 4-/5   Wrist flexion    Wrist extension    Wrist ulnar deviation    Wrist radial deviation    Wrist pronation    Wrist supination      HAND FUNCTION: Grip strength: Right: 32 lbs; Left: 18 lbs, 02/18/23 R 55 lbs, L 35 lbs 05/13/23- grip RUE 42, LUE 34  COORDINATION: 9 Hole Peg test:initial eval  Right: 1 min 48 sec; Left: 4 pegs in 2 mins  Box and Blocks:  Right 35 blocks, Left  26 blocks 02/18/23- RUE 35.46, LUE 65 secs  SENSATION: Light touch: WFL Hot/Cold: Not tested    COGNITION: Overall cognitive status:NT   VISION: Subjective report: reports hx of glaucoma VISION ASSESSMENT: Not tested  OBSERVATIONS: Pt is eager to improve, pt's husband is very supportive   TODAY'S TREATMENT:                                                                                                                              DATE:07/24/23- arm bike x 6 mins level 1 for conditioning, with stockinette over right hand [place] , 0.8 w/cm 2, 20% to ulnar side of palm and wrist  for pain and inflammation, no adverse reactions. Ice pack applied to R hand x 8 mins while pt copied small peg design with LUE for increased fine motor coordination and functional use, min difficulty/drops. Pt reported no pain at the end of session. Pt was instructed to try icing her hand at home, with ice pack wrapped in a towel for 5-8 mins several times per day.  07/22/23-Pt returns to therapy after several weeks, she has been having difficulty with receiving her IVIG treatment, has had illness and has had increased pain in RUE and difficulty weightbearing through her right hand. Therapist checked progress towards goals. Discussion with pt regarding plans to renew however pt will need to be  able to demonstrate progress to continue beyond new renewal period. Paraffin wax to RUE x 10 mins while pt performed fine motor coordination task LUE. No adverse reactions. Grooved pegs for LUE fine motor coordination, min difficulty, then  removing with in hand manipulation, min difficulty. Standing to place graded clothespins on target with LUE 1-8 # for sustained pinch close supervision. Pt was instructed to stop using putty for RUE and to reduce her weightbearing though RUE to minimize pain. Therapist recommends pt sees MD regarding pain.   07/01/23- Arm bike x 6 mins level 1  Pt ambulated with supervision from arm bike to OT therapy room approximately 90ft with RW for endurance for ADLs Pt reported SOB after amb. HR 107, O2 sats 100% Pt rested for 5 mins and HR=95% Placing grooved pegs into pegboard with LUE for increased fine motor coordination, increased time and min-mod difficulty Gripper set at level 2 for sustained grip to pick up 1 inch blocks, with right and left UE's min difficulty/ drops.  06/26/23-Arm bike x 6 mins level 1 for conditioning Pt ambulated w/c to arm bike 10' then pt later ambulated down the hall approximately 50 ft  Pt copied small peg design for small peg design with left and right UE's for increased fine motor coordination, mod difficulty with LUE, min difficulty with right. Grip R 40 lbs, L 30 lbs Graded clothespins  1-8# for sustained pinch for LUE mod difficulty, RUE min difficulty.  Pt was able to ambulate back to main gym with supervision approximately 40 ft  to first mat from OT office with RW and supervision   PATIENT EDUCATION: Education details: Person educated: Patient Education method: explanation, demonstration Education comprehension: verbalized understanding, returned demonstration.  HOME EXERCISE PROGRAM:  12/17/22- coordination HEP, cane exercises seated   GOALS: Goals reviewed with patient? Yes  SHORT TERM GOALS: Target date: 08/20/23  I with initial HEP Baseline: Goal status: met 01/21/23  2.  Pt will demonstrate improved UE functional use as evidenced increasing bilateral box/ blocks score by 4 blocks.  Baseline: R 35, L 26 Goal status  met 12/31/22 RUE :43:  LUE  35    3.  Pt will donn pants with mod A. Baseline: max A Goal status:met  02/18/23- pt performed mod I today  4. Pt will verbalize understanding of adapted strategies to maximize safety and I with ADLs/ IADLs .  Goal status:  met, pt has been using her tub bench  5.  Pt will stand for 5 mins prior to rest break in prep for ADLS. Revised goal: Pt will stand x 4 mins prior to rest break in prep for ADLS.  Goal status: ongoing, pt. stood for 2 mins 53 secs 07/22/23   6.  Pt will perform w/c to [facility] transfers mod I-  mod I per pt report 05/08/23  Goal status:  met, 02/18/23 7. Pt will increase bilateral grip strength by 5 lbs for increased functional use of UE's  Revised goal: Pt will demonstrate 33 lbs grip strength for RUE for increased functional use.    Goal status:  ongoing RUE 30 lbs,  LUE 28 lbs 07/22/23  8. Pt will demonstrate 31 lbs grip strength for LUE for increased functional use.  Baseline: 07/22/23 LUE 28 lbs  Goal status: initial      LONG TERM GOALS: Target date: 09/23/23 I with updated HEP  Goal status: met, 07/22/23  2.  Pt will perform LB dressing mod I. Baseline:max A Goal status:met 03/25/23-  8/6- patient indicates she is dressing herself.   3.  Pt will bathe with supervision/ set up. Baseline: min-mod A for sponge bath Goal status: duplicate goal, therefore deferred,  see below, pt met for sponge bathing.  4. : Pt will perform basic cooking modified independently at a walker level. Revised goal: Pt will perform microwave cooking and sandwich prep at a walker level mod I Goal status:  Ongoing, pt is currently performing at a w/c level 07/22/23 not walking in kitchen consistently   5.  Pt will demonstrate improved fine motor coordination as evidenced by performing 9 hole peg test in 55 secs or less ( for LUE)  Revised goal: Pt will demonstrate improved fine motor coordination as evidenced by performing LUE 9 hole peg test in 68 secs or less Goal status:  ongoing- 1 min 15 secs 07/22/23  6.  Pt will demonstrate improved fine motor coordination as evidenced by performing RUE 9 hole peg test in 1 mins  30 secs or less.  Baseline: RUE  1 min 48 secs, LUE 4 pegs placed in 2 mins  Goal status: met 02/18/23 RUE 35.46,     7. Pt will perform shower with supervision using tub transfer bench Goal status: met,pt performs mod I    8. Pt will perform basic home management at a walker level mod I    Revised goal: Pt will consistently wipe countertops off in standing and she will stand to fold laundry mod I.    Goal status: ongoing, 07/22/23- still performing at w/c level, not consistent, wipes countertops off in standing occasionally   9. Pt will report RUE pain no greater than 4/10 for functional activity.                             Baseline: 6-8/10    Goal status: initial  CLINICAL IMPRESSION: Pt is progressing towards goals. Her RUE pain responded to [place] and ice today.PERFORMANCE DEFICITS: in functional skills including ADLs, IADLs, coordination, dexterity, sensation, ROM, strength, flexibility, Fine motor control, Gross motor control, mobility, balance, endurance, decreased knowledge of precautions, decreased knowledge of use of DME, and UE functional use, cognitive skills including  and psychosocial skills including coping strategies, environmental adaptation, habits, interpersonal interactions, and routines and behaviors.   IMPAIRMENTS: are limiting patient from ADLs, IADLs, rest and sleep, play, leisure, and social participation.   CO-MORBIDITIES: may have co-morbidities  that affects occupational performance. Patient will benefit from skilled OT to address above impairments and improve overall function.  MODIFICATION OR ASSISTANCE TO COMPLETE EVALUATION: No modification of tasks or assist necessary to complete an evaluation.  OT OCCUPATIONAL PROFILE AND HISTORY: Detailed assessment: Review of records and additional review of physical, cognitive,  psychosocial history related to current functional performance.  CLINICAL DECISION MAKING: LOW - limited treatment options, no task modification necessary  REHAB POTENTIAL: Good  EVALUATION COMPLEXITY: Low    PLAN:  OT FREQUENCY:1-2x week  OT DURATION: 9 weeks  PLANNED INTERVENTIONS: self care/ADL training, therapeutic exercise, therapeutic activity, neuromuscular re-education, manual therapy, passive range of motion, gait training, balance training, stair training, functional mobility training, aquatic therapy, ultrasound, paraffin, moist heat, cryotherapy, patient/family education, energy conservation, coping strategies training, DME and/or AE instructions, and Re-evaluation  RECOMMENDED OTHER SERVICES: PT  CONSULTED AND AGREED WITH PLAN OF CARE: Patient and family member/caregiver  PLAN FOR NEXT SESSION: continue to work towards updated goals, address hand pain, coordination, standing tolerance for ADLs,  [place], OT 07/24/2023, 12:11 PM          3

## 2023-07-29 ENCOUNTER — Ambulatory Visit: Payer: 59 | Admitting: Occupational Therapy

## 2023-07-29 ENCOUNTER — Ambulatory Visit: Payer: 59 | Admitting: Physical Therapy

## 2023-07-31 ENCOUNTER — Ambulatory Visit: Payer: 59 | Admitting: Adult Health

## 2023-07-31 ENCOUNTER — Ambulatory Visit: Payer: 59 | Admitting: Physical Therapy

## 2023-07-31 ENCOUNTER — Ambulatory Visit: Payer: 59 | Admitting: Occupational Therapy

## 2023-08-01 ENCOUNTER — Ambulatory Visit (INDEPENDENT_AMBULATORY_CARE_PROVIDER_SITE_OTHER): Payer: 59 | Admitting: Family Medicine

## 2023-08-01 ENCOUNTER — Ambulatory Visit: Payer: 59 | Admitting: Adult Health

## 2023-08-01 ENCOUNTER — Encounter: Payer: Self-pay | Admitting: Family Medicine

## 2023-08-01 VITALS — BP 142/80 | HR 75 | Temp 97.6°F | Wt 308.0 lb

## 2023-08-01 DIAGNOSIS — L03115 Cellulitis of right lower limb: Secondary | ICD-10-CM

## 2023-08-01 DIAGNOSIS — L859 Epidermal thickening, unspecified: Secondary | ICD-10-CM | POA: Insufficient documentation

## 2023-08-01 MED ORDER — CEPHALEXIN 500 MG PO CAPS: 500.0000 mg | ORAL_CAPSULE | Freq: Four times a day (QID) | ORAL | 0 refills | Status: DC

## 2023-08-01 MED ORDER — CEFTRIAXONE SODIUM 1 G IJ SOLR
1.0000 g | Freq: Once | INTRAMUSCULAR | Status: AC
  Administered 2023-08-01: 1 g via INTRAMUSCULAR

## 2023-08-01 MED ORDER — AMMONIUM LACTATE 12 % EX LOTN: 1.0000 | TOPICAL_LOTION | Freq: Two times a day (BID) | CUTANEOUS | 5 refills | Status: DC

## 2023-08-01 NOTE — Addendum Note (Signed)
Addended by: Carola Rhine on: 08/01/2023 04:16 PM   Modules accepted: Orders

## 2023-08-01 NOTE — Progress Notes (Signed)
   Subjective:    Patient ID: Dana [name], female    DOB: 10-18-1965, 57 y.o.   MRN: [id number]  HPI Here for swelling and pain in the right foot that started suddenly 3 days ago. She has chronic leg edema. With her obesity, she tends to sleep on her back. The swelling and redness has progressed and has extended above the ankle now. No fever. The other issue is thick callused skin on the bottoms of both feet, the right being worse than the left. This has been going on for a year or so.    Review of Systems  Constitutional: Negative.   Respiratory: Negative.    Cardiovascular:  Positive for leg swelling.  Skin:  Positive for color change.       Objective:   Physical Exam Constitutional:      Appearance: She is obese.     Comments: In a motorized scooter   Cardiovascular:     Rate and Rhythm: Normal rate and regular rhythm.     Pulses: Normal pulses.     Heart sounds: Normal heart sounds.  Pulmonary:     Effort: Pulmonary effort is normal.     Breath sounds: Normal breath sounds.  Musculoskeletal:     Comments: 4+ edema in the right lower leg and 3+ edema in the left lower leg.   Skin:    Comments: The right foot is swollen, red, warm, and tender to the touch. This extends above the ankle. There is a 2 cm blister on the back of the right heel that is open and draining serous fluid. The soles of both feet have very thick, scaly skin   Neurological:     Mental Status: She is alert.           Assessment & Plan:  She has a cellulitis in the right foot, and I believe this is the result of the blister on the back of the foot opening. She is given a shot of Rocephin, and she will start 10 days of Keflex. I asked them to report back early next week. She also has hyperkeratosis on the soles, and we will treat this by applying Ammonium lactate lotion BID.  Gershon Crane, MD

## 2023-08-06 ENCOUNTER — Ambulatory Visit: Payer: 59 | Admitting: Occupational Therapy

## 2023-08-06 ENCOUNTER — Ambulatory Visit: Payer: 59 | Admitting: Surgical

## 2023-08-13 ENCOUNTER — Ambulatory Visit: Payer: 59 | Admitting: Surgical

## 2023-08-18 NOTE — Therapy (Unsigned)
OUTPATIENT OCCUPATIONAL THERAPY NEURO TREATMENT   Patient Name: Dana [name] MRN: [id number] DOB:04/29/1966, 57 y.o., female Today's Date: 08/18/2023  PCP: Dr. Clent Ridges REFERRING PROVIDER: Dr. Gershon Crane  END OF SESSION:                 Past Medical History:  Diagnosis Date   Anxiety    Back pain with radiation    Breast mass, right    Chronic pain    Chronic, continuous use of opioids    Complication of anesthesia 04/07/14   Allergic reaction to Lisinopril immediately following surgery   DJD (degenerative joint disease)    Fibromyalgia    Groin abscess    Headache(784.0)    History of IBS    Hypercholesterolemia    IBS (irritable bowel syndrome)    Lactose intolerance    Mild hypertension    Obesity    Tobacco use disorder    Umbilical hernia    Symptomatic   Past Surgical History:  Procedure Laterality Date   ABDOMINAL HYSTERECTOMY     ANTERIOR CERVICAL DECOMP/DISCECTOMY FUSION N/A 12/20/2015   Procedure: ANTERIOR CERVICAL DECOMPRESSION FUSION CERVICAL 4-5, CERVICAL 5-6, CERVICAL 6-7 WITH INSTRUMENTATION AND ALLOGRAFT;  Surgeon: Estill Bamberg, MD;  Location: [facility] OR;  Service: Orthopedics;  Laterality: N/A;  Anterior cervical decompression fusion, cervical 4-5, cervical 5-6, cervical 6-7 with instrumentation and allograft   BACK SURGERY     BILATERAL SALPINGECTOMY  09/03/2012   Procedure: BILATERAL SALPINGECTOMY;  Surgeon: Ok Edwards, MD;  Location: [facility];  Service: Gynecology;  Laterality: Bilateral;   BREAST BIOPSY Right 04/07/2014   Procedure: REMOVAL RIGHT BREAST MASS WITH WIRE LOCALIZATION;  Surgeon: Adolph Pollack, MD;  Location: [facility];  Service: General;  Laterality: Right;   BREAST EXCISIONAL BIOPSY Left    x2   BREAST LUMPECTOMY     x2   CARPAL TUNNEL RELEASE Left 05/11/2019   Procedure: LEFT CARPAL TUNNEL RELEASE, RIGHT TENNIS ELBOW MARCAINE/DEPO MEDROL INJECTION UNDER ANESTHESIA;  Surgeon: Kerrin Champagne, MD;  Location: [facility] OR;  Service:  Orthopedics;  Laterality: Left;   COLONOSCOPY W/ BIOPSIES  04/24/2012   per Dr. Leone Payor, clear, repeat in 10 yrs    disectomy     ESOPHAGOGASTRODUODENOSCOPY     FINGER SURGERY     Right index-excision of mass    FOOT SURGERY Right    Bone Spurs   IR FLUORO GUIDE CV LINE RIGHT  01/29/2023   IR REMOVAL TUN CV CATH W/O FL  02/08/2023   IR US GUIDE VASC ACCESS RIGHT  01/31/2023   LAPAROSCOPIC HYSTERECTOMY  09/03/2012   Procedure: HYSTERECTOMY TOTAL LAPAROSCOPIC;  Surgeon: Ok Edwards, MD;  Location: [facility];  Service: Gynecology;  Laterality: N/A;   LUMBAR DISC SURGERY     TUBAL LIGATION     UMBILICAL HERNIA REPAIR N/A 05/01/2018   Procedure: UMBILICAL HERNIA REPAIR;  Surgeon: Jimmye Norman, MD;  Location: [facility] OR;  Service: General;  Laterality: N/A;   Patient Active Problem List   Diagnosis Date Noted   Hyperkeratosis of sole 08/01/2023   CIDP (chronic inflammatory demyelinating polyneuropathy) (HCC) 01/28/2023   Gait abnormality 01/28/2023   Glaucoma 01/28/2023   Pain due to onychomycosis of toenails of both feet 01/21/2023   Wheelchair dependence 12/30/2022   Nerve pain 12/30/2022   Insomnia due to medical condition 12/30/2022   Acute inflammatory demyelinating polyneuropathy (HCC) 10/01/2022   UTI (urinary tract infection) 09/24/2022   Hypomagnesemia 09/24/2022   Weakness 09/23/2022  Hypokalemia 09/23/2022   B12 deficiency 09/05/2022   Folate deficiency 09/05/2022   Carpal tunnel syndrome, left upper limb 05/11/2019    Class: Chronic   Spinal stenosis of lumbar region with neurogenic claudication 08/20/2018   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   Primary osteoarthritis of right knee 02/27/2017   Right tennis elbow 11/11/2016   Muscle cramps 01/15/2016   Bilateral leg edema 01/08/2016   Radiculopathy 12/20/2015   Hyperglycemia 12/21/2014   Mastodynia, female 11/30/2014   S/P excision of fibroadenoma of breast 11/30/2014   Angioedema of lips 04/07/2014    Fibroadenoma of right breast 03/07/2014   Pelvic pain in female 06/19/2012   Menorrhagia 06/11/2012   SUI (stress urinary incontinence, female) 06/11/2012   HTN (hypertension) 06/11/2012   IBS (irritable bowel syndrome) - diarrhea predominant 03/17/2012   MICROSCOPIC HEMATURIA 11/30/2010   BREAST PAIN, RIGHT 11/30/2010   BREAST MASS, RIGHT 01/12/2010   HYPERCHOLESTEROLEMIA 12/07/2007   CIGARETTE SMOKER 12/07/2007   Osteoarthritis 12/07/2007   Low back pain with sciatica 12/07/2007   Headache 12/07/2007   Obesity, Class III, BMI 40-49.9 (morbid obesity) (HCC) 08/03/2007   Anxiety state 08/03/2007    ONSET DATE: 12/05/22- referral date  REFERRING DIAG: G61.0 (ICD-10-CM) - Guillain Barr syndrome (HCC)  THERAPY DIAG:  No diagnosis found.    Rationale for Evaluation and Treatment: Rehabilitation  SUBJECTIVE:   SUBJECTIVE STATEMENT: Pt reports her treatment went well yesterday PERTINENT HISTORY: Pt is a 57 y/o female hospitalized 09/23/22  with progressive weakness and sensation changes, consistent with Guillain-Barr syndrome. MRI negative. LP results and assessment most consistent with Guillain-Barr syndrome.  Pt was d/c home from CIR 11/06/22 PMH includes: glaucoma anxiety, DJD, fibromyalgia, HTN, tobacco use, B-12 deficiency, OA, ACDF 2017.  Pt was re-hospitalized 01/28/2456 with CIDP (chronic inflammatory demyelinating polyneuropathy). Saw Neuro on day of admission, NCS showed demyelination, referred to hospital for PLEX. Initially diagnosed with an acute demyelinating neuropathy Jan 2024, treated with IVIG. PMH: Spinal stenosis, HTN, Obesity   PRECAUTIONS: Fall  WEIGHT BEARING RESTRICTIONS: no  PAIN:  Are you having pain? Yes: NPRS scale: 3-4/10 Pain location: RUE Pain description: aching, sharp  Aggravating factors: weightbearing Relieving factors: massage       FALLS: Has patient fallen in last 6 months? No  LIVING ENVIRONMENT: Lives with: lives with their  spouse Lives in: House/apartment Stairs: has ramp built Has following equipment at home:   BSC, steadi,  tub bench but can't access bathroom   PLOF: Independent  PATIENT GOALS: increase independence   OBJECTIVE:   HAND DOMINANCE: Right  ADLs: from initial eval Overall ADLs: assistance required, unable to access BR Transfers/ambulation related to ADLs: Eating: unable to cut food Grooming: set up UB Dressing: set up for bra and shirt LB Dressing: max A Toileting: uses BSC uses steadi Bathing: min-mod A in hospital  bed  Tub Shower transfers: n/a Equipment: Emergency planning/management officer  IADLs:dependent for IADLS   MOBILITY STATUS:  uses w/c  , per PT report minguard for transfer from w/c to mat    ACTIVITY TOLERANCE: Activity tolerance: Pt fatigues quickly    UPPER EXTREMITY ROM:  see updates for 02/18/23 re-eval   Active ROM Right 02/18/23 Left 02/18/23  Shoulder flexion 130 120  Shoulder abduction 90 90  Shoulder adduction    Shoulder extension    Shoulder internal rotation    Shoulder external rotation    Elbow flexion    Elbow extension  -5  Wrist flexion    Wrist extension  Wrist ulnar deviation    Wrist radial deviation    Wrist pronation    Wrist supination    (Blank rows = not tested)  UPPER EXTREMITY MMT:   see below for re-eval 02/18/23  MMT Right 02/18/23 Left 02/18/23  Shoulder flexion 3+/5 3+/5  Shoulder abduction    Shoulder adduction    Shoulder extension    Shoulder internal rotation    Shoulder external rotation    Middle trapezius    Lower trapezius    Elbow flexion 4/5 4/5  Elbow extension 4-/5 4-/5  Wrist flexion    Wrist extension    Wrist ulnar deviation    Wrist radial deviation    Wrist pronation    Wrist supination      HAND FUNCTION: Grip strength: Right: 32 lbs; Left: 18 lbs, 02/18/23 R 55 lbs, L 35 lbs 05/13/23- grip RUE 42, LUE 34  COORDINATION: 9 Hole Peg test:initial eval  Right: 1 min 48 sec; Left: 4 pegs in 2 mins  Box and  Blocks:  Right 35 blocks, Left  26 blocks 02/18/23- RUE 35.46, LUE 65 secs  SENSATION: Light touch: WFL Hot/Cold: Not tested    COGNITION: Overall cognitive status:NT   VISION: Subjective report: reports hx of glaucoma VISION ASSESSMENT: Not tested  OBSERVATIONS: Pt is eager to improve, pt's husband is very supportive   TODAY'S TREATMENT:                                                                                                                              DATE:07/24/23- arm bike x 6 mins level 1 for conditioning, with stockinette over right hand [place] , 0.8 w/cm 2, 20% to ulnar side of palm and wrist  for pain and inflammation, no adverse reactions. Ice pack applied to R hand x 8 mins while pt copied small peg design with LUE for increased fine motor coordination and functional use, min difficulty/drops. Pt reported no pain at the end of session. Pt was instructed to try icing her hand at home, with ice pack wrapped in a towel for 5-8 mins several times per day.  07/22/23-Pt returns to therapy after several weeks, she has been having difficulty with receiving her IVIG treatment, has had illness and has had increased pain in RUE and difficulty weightbearing through her right hand. Therapist checked progress towards goals. Discussion with pt regarding plans to renew however pt will need to be able to demonstrate progress to continue beyond new renewal period. Paraffin wax to RUE x 10 mins while pt performed fine motor coordination task LUE. No adverse reactions. Grooved pegs for LUE fine motor coordination, min difficulty, then removing with in hand manipulation, min difficulty. Standing to place graded clothespins on target with LUE 1-8 # for sustained pinch close supervision. Pt was instructed to stop using putty for RUE and to reduce her weightbearing though RUE to minimize pain. Therapist recommends pt sees MD regarding pain.   07/01/23-  Arm bike x 6 mins level 1  Pt  ambulated with supervision from arm bike to OT therapy room approximately 73ft with RW for endurance for ADLs Pt reported SOB after amb. HR 107, O2 sats 100% Pt rested for 5 mins and HR=95% Placing grooved pegs into pegboard with LUE for increased fine motor coordination, increased time and min-mod difficulty Gripper set at level 2 for sustained grip to pick up 1 inch blocks, with right and left UE's min difficulty/ drops.  06/26/23-Arm bike x 6 mins level 1 for conditioning Pt ambulated w/c to arm bike 10' then pt later ambulated down the hall approximately 50 ft  Pt copied small peg design for small peg design with left and right UE's for increased fine motor coordination, mod difficulty with LUE, min difficulty with right. Grip R 40 lbs, L 30 lbs Graded clothespins  1-8# for sustained pinch for LUE mod difficulty, RUE min difficulty.  Pt was able to ambulate back to main gym with supervision approximately 40 ft  to first mat from OT office with RW and supervision   PATIENT EDUCATION: Education details: Person educated: Patient Education method: explanation, demonstration Education comprehension: verbalized understanding, returned demonstration.  HOME EXERCISE PROGRAM:  12/17/22- coordination HEP, cane exercises seated   GOALS: Goals reviewed with patient? Yes  SHORT TERM GOALS: Target date: 08/20/23  I with initial HEP Baseline: Goal status: met 01/21/23  2.  Pt will demonstrate improved UE functional use as evidenced increasing bilateral box/ blocks score by 4 blocks.  Baseline: R 35, L 26 Goal status  met 12/31/22 RUE :43:  LUE 35    3.  Pt will donn pants with mod A. Baseline: max A Goal status:met  02/18/23- pt performed mod I today  4. Pt will verbalize understanding of adapted strategies to maximize safety and I with ADLs/ IADLs .  Goal status:  met, pt has been using her tub bench  5.  Pt will stand for 5 mins prior to rest break in prep for ADLS. Revised goal: Pt  will stand x 4 mins prior to rest break in prep for ADLS.  Goal status: ongoing, pt. stood for 2 mins 53 secs 07/22/23   6.  Pt will perform w/c to [facility] transfers mod I-  mod I per pt report 05/08/23  Goal status:  met, 02/18/23 7. Pt will increase bilateral grip strength by 5 lbs for increased functional use of UE's  Revised goal: Pt will demonstrate 33 lbs grip strength for RUE for increased functional use.    Goal status:  ongoing RUE 30 lbs,  LUE 28 lbs 07/22/23  8. Pt will demonstrate 31 lbs grip strength for LUE for increased functional use.  Baseline: 07/22/23 LUE 28 lbs  Goal status: initial      LONG TERM GOALS: Target date: 09/23/23 I with updated HEP  Goal status: met, 07/22/23  2.  Pt will perform LB dressing mod I. Baseline:max A Goal status:met 03/25/23- 8/6- patient indicates she is dressing herself.   3.  Pt will bathe with supervision/ set up. Baseline: min-mod A for sponge bath Goal status: duplicate goal, therefore deferred,  see below, pt met for sponge bathing.  4. : Pt will perform basic cooking modified independently at a walker level. Revised goal: Pt will perform microwave cooking and sandwich prep at a walker level mod I Goal status:  Ongoing, pt is currently performing at a w/c level 07/22/23 not walking in kitchen consistently   5.  Pt will demonstrate improved fine motor coordination as evidenced by performing 9 hole peg test in 55 secs or less ( for LUE)  Revised goal: Pt will demonstrate improved fine motor coordination as evidenced by performing LUE 9 hole peg test in 68 secs or less Goal status: ongoing- 1 min 15 secs 07/22/23  6.  Pt will demonstrate improved fine motor coordination as evidenced by performing RUE 9 hole peg test in 1 mins  30 secs or less.  Baseline: RUE  1 min 48 secs, LUE 4 pegs placed in 2 mins  Goal status: met 02/18/23 RUE 35.46,     7. Pt will perform shower with supervision using tub transfer bench Goal status: met,pt  performs mod I    8. Pt will perform basic home management at a walker level mod I    Revised goal: Pt will consistently wipe countertops off in standing and she will stand to fold laundry mod I.    Goal status: ongoing, 07/22/23- still performing at w/c level, not consistent, wipes countertops off in standing occasionally   9. Pt will report RUE pain no greater than 4/10 for functional activity.                             Baseline: 6-8/10    Goal status: initial  CLINICAL IMPRESSION: Pt is progressing towards goals. Her RUE pain responded to [place] and ice today.PERFORMANCE DEFICITS: in functional skills including ADLs, IADLs, coordination, dexterity, sensation, ROM, strength, flexibility, Fine motor control, Gross motor control, mobility, balance, endurance, decreased knowledge of precautions, decreased knowledge of use of DME, and UE functional use, cognitive skills including  and psychosocial skills including coping strategies, environmental adaptation, habits, interpersonal interactions, and routines and behaviors.   IMPAIRMENTS: are limiting patient from ADLs, IADLs, rest and sleep, play, leisure, and social participation.   CO-MORBIDITIES: may have co-morbidities  that affects occupational performance. Patient will benefit from skilled OT to address above impairments and improve overall function.  MODIFICATION OR ASSISTANCE TO COMPLETE EVALUATION: No modification of tasks or assist necessary to complete an evaluation.  OT OCCUPATIONAL PROFILE AND HISTORY: Detailed assessment: Review of records and additional review of physical, cognitive, psychosocial history related to current functional performance.  CLINICAL DECISION MAKING: LOW - limited treatment options, no task modification necessary  REHAB POTENTIAL: Good  EVALUATION COMPLEXITY: Low    PLAN:  OT FREQUENCY:1-2x week  OT DURATION: 9 weeks  PLANNED INTERVENTIONS: self care/ADL training, therapeutic exercise, therapeutic  activity, neuromuscular re-education, manual therapy, passive range of motion, gait training, balance training, stair training, functional mobility training, aquatic therapy, ultrasound, paraffin, moist heat, cryotherapy, patient/family education, energy conservation, coping strategies training, DME and/or AE instructions, and Re-evaluation  RECOMMENDED OTHER SERVICES: PT  CONSULTED AND AGREED WITH PLAN OF CARE: Patient and family member/caregiver  PLAN FOR NEXT SESSION: continue to work towards updated goals, address hand pain, coordination, standing tolerance for ADLs,   Chaley Castellanos, OT 08/18/2023, 10:00 AM          3

## 2023-08-19 ENCOUNTER — Ambulatory Visit: Payer: 59 | Admitting: Physical Therapy

## 2023-08-19 ENCOUNTER — Ambulatory Visit: Payer: 59 | Admitting: Occupational Therapy

## 2023-08-24 ENCOUNTER — Other Ambulatory Visit: Payer: Self-pay | Admitting: Physical Medicine and Rehabilitation

## 2023-08-26 ENCOUNTER — Ambulatory Visit: Payer: 59 | Admitting: Occupational Therapy

## 2023-08-26 ENCOUNTER — Encounter: Payer: Self-pay | Admitting: Occupational Therapy

## 2023-08-26 ENCOUNTER — Encounter: Payer: Self-pay | Admitting: Physical Therapy

## 2023-08-26 ENCOUNTER — Ambulatory Visit: Payer: 59 | Attending: Family Medicine | Admitting: Physical Therapy

## 2023-08-26 DIAGNOSIS — G6181 Chronic inflammatory demyelinating polyneuritis: Secondary | ICD-10-CM

## 2023-08-26 DIAGNOSIS — R278 Other lack of coordination: Secondary | ICD-10-CM | POA: Diagnosis present

## 2023-08-26 DIAGNOSIS — M6281 Muscle weakness (generalized): Secondary | ICD-10-CM

## 2023-08-26 DIAGNOSIS — R2689 Other abnormalities of gait and mobility: Secondary | ICD-10-CM

## 2023-08-26 DIAGNOSIS — R2681 Unsteadiness on feet: Secondary | ICD-10-CM

## 2023-08-26 DIAGNOSIS — R262 Difficulty in walking, not elsewhere classified: Secondary | ICD-10-CM

## 2023-08-26 NOTE — Therapy (Unsigned)
OUTPATIENT OCCUPATIONAL THERAPY NEURO TREATMENT   Patient Name: Dana [name] MRN: [id number] DOB:1966-05-23, 57 y.o., female Today's Date: 08/26/2023  PCP: Dr. Clent Ridges REFERRING PROVIDER: Dr. Gershon Crane  END OF SESSION:  OT End of Session - 08/26/23 0801     Visit Number 30    Number of Visits 40    Date for OT Re-Evaluation 09/23/23    Authorization Type UHC, Medicare    Authorization Time Period KX    Authorization - Visit Number 32    Progress Note Due on Visit 33    OT Start Time 0845    OT Stop Time 0925    OT Time Calculation (min) 40 min    Activity Tolerance Patient tolerated treatment well    Behavior During Therapy [facility] for tasks assessed/performed                          Past Medical History:  Diagnosis Date   Anxiety    Back pain with radiation    Breast mass, right    Chronic pain    Chronic, continuous use of opioids    Complication of anesthesia 04/07/14   Allergic reaction to Lisinopril immediately following surgery   DJD (degenerative joint disease)    Fibromyalgia    Groin abscess    Headache(784.0)    History of IBS    Hypercholesterolemia    IBS (irritable bowel syndrome)    Lactose intolerance    Mild hypertension    Obesity    Tobacco use disorder    Umbilical hernia    Symptomatic   Past Surgical History:  Procedure Laterality Date   ABDOMINAL HYSTERECTOMY     ANTERIOR CERVICAL DECOMP/DISCECTOMY FUSION N/A 12/20/2015   Procedure: ANTERIOR CERVICAL DECOMPRESSION FUSION CERVICAL 4-5, CERVICAL 5-6, CERVICAL 6-7 WITH INSTRUMENTATION AND ALLOGRAFT;  Surgeon: Estill Bamberg, MD;  Location: [facility] OR;  Service: Orthopedics;  Laterality: N/A;  Anterior cervical decompression fusion, cervical 4-5, cervical 5-6, cervical 6-7 with instrumentation and allograft   BACK SURGERY     BILATERAL SALPINGECTOMY  09/03/2012   Procedure: BILATERAL SALPINGECTOMY;  Surgeon: Ok Edwards, MD;  Location: [facility] ORS;  Service: Gynecology;  Laterality:  Bilateral;   BREAST BIOPSY Right 04/07/2014   Procedure: REMOVAL RIGHT BREAST MASS WITH WIRE LOCALIZATION;  Surgeon: Adolph Pollack, MD;  Location: [facility] OR;  Service: General;  Laterality: Right;   BREAST EXCISIONAL BIOPSY Left    x2   BREAST LUMPECTOMY     x2   CARPAL TUNNEL RELEASE Left 05/11/2019   Procedure: LEFT CARPAL TUNNEL RELEASE, RIGHT TENNIS ELBOW MARCAINE/DEPO MEDROL INJECTION UNDER ANESTHESIA;  Surgeon: Kerrin Champagne, MD;  Location: [facility] OR;  Service: Orthopedics;  Laterality: Left;   COLONOSCOPY W/ BIOPSIES  04/24/2012   per Dr. Leone Payor, clear, repeat in 10 yrs    disectomy     ESOPHAGOGASTRODUODENOSCOPY     FINGER SURGERY     Right index-excision of mass    FOOT SURGERY Right    Bone Spurs   IR FLUORO GUIDE CV LINE RIGHT  01/29/2023   IR REMOVAL TUN CV CATH W/O FL  02/08/2023   IR US GUIDE VASC ACCESS RIGHT  01/31/2023   LAPAROSCOPIC HYSTERECTOMY  09/03/2012   Procedure: HYSTERECTOMY TOTAL LAPAROSCOPIC;  Surgeon: Ok Edwards, MD;  Location: [facility] ORS;  Service: Gynecology;  Laterality: N/A;   LUMBAR DISC SURGERY     TUBAL LIGATION     UMBILICAL HERNIA REPAIR N/A  05/01/2018   Procedure: UMBILICAL HERNIA REPAIR;  Surgeon: Jimmye Norman, MD;  Location: [facility] OR;  Service: General;  Laterality: N/A;   Patient Active Problem List   Diagnosis Date Noted   Hyperkeratosis of sole 08/01/2023   CIDP (chronic inflammatory demyelinating polyneuropathy) (HCC) 01/28/2023   Gait abnormality 01/28/2023   Glaucoma 01/28/2023   Pain due to onychomycosis of toenails of both feet 01/21/2023   Wheelchair dependence 12/30/2022   Nerve pain 12/30/2022   Insomnia due to medical condition 12/30/2022   Acute inflammatory demyelinating polyneuropathy (HCC) 10/01/2022   UTI (urinary tract infection) 09/24/2022   Hypomagnesemia 09/24/2022   Weakness 09/23/2022   Hypokalemia 09/23/2022   B12 deficiency 09/05/2022   Folate deficiency 09/05/2022   Carpal tunnel syndrome, left upper limb 05/11/2019     Class: Chronic   Spinal stenosis of lumbar region with neurogenic claudication 08/20/2018   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   Primary osteoarthritis of right knee 02/27/2017   Right tennis elbow 11/11/2016   Muscle cramps 01/15/2016   Bilateral leg edema 01/08/2016   Radiculopathy 12/20/2015   Hyperglycemia 12/21/2014   Mastodynia, female 11/30/2014   S/P excision of fibroadenoma of breast 11/30/2014   Angioedema of lips 04/07/2014   Fibroadenoma of right breast 03/07/2014   Pelvic pain in female 06/19/2012   Menorrhagia 06/11/2012   SUI (stress urinary incontinence, female) 06/11/2012   HTN (hypertension) 06/11/2012   IBS (irritable bowel syndrome) - diarrhea predominant 03/17/2012   MICROSCOPIC HEMATURIA 11/30/2010   BREAST PAIN, RIGHT 11/30/2010   BREAST MASS, RIGHT 01/12/2010   HYPERCHOLESTEROLEMIA 12/07/2007   CIGARETTE SMOKER 12/07/2007   Osteoarthritis 12/07/2007   Low back pain with sciatica 12/07/2007   Headache 12/07/2007   Obesity, Class III, BMI 40-49.9 (morbid obesity) (HCC) 08/03/2007   Anxiety state 08/03/2007    ONSET DATE: 12/05/22- referral date  REFERRING DIAG: G61.0 (ICD-10-CM) - Guillain Barr syndrome (HCC)  THERAPY DIAG:  Muscle weakness (generalized)  Other lack of coordination  Unsteadiness on feet  Other abnormalities of gait and mobility    Rationale for Evaluation and Treatment: Rehabilitation  SUBJECTIVE:   SUBJECTIVE STATEMENT: Pt reports her treatment went well yesterday PERTINENT HISTORY: Pt is a 57 y/o female hospitalized 09/23/22  with progressive weakness and sensation changes, consistent with Guillain-Barr syndrome. MRI negative. LP results and assessment most consistent with Guillain-Barr syndrome.  Pt was d/c home from CIR 11/06/22 PMH includes: glaucoma anxiety, DJD, fibromyalgia, HTN, tobacco use, B-12 deficiency, OA, ACDF 2017.  Pt was re-hospitalized 01/28/2456 with CIDP (chronic inflammatory demyelinating  polyneuropathy). Saw Neuro on day of admission, NCS showed demyelination, referred to hospital for PLEX. Initially diagnosed with an acute demyelinating neuropathy Jan 2024, treated with IVIG. PMH: Spinal stenosis, HTN, Obesity   PRECAUTIONS: Fall  WEIGHT BEARING RESTRICTIONS: no  PAIN:  Are you having pain? Yes: NPRS scale: 3-4/10 Pain location: RUE Pain description: aching, sharp  Aggravating factors: weightbearing Relieving factors: massage       FALLS: Has patient fallen in last 6 months? No  LIVING ENVIRONMENT: Lives with: lives with their spouse Lives in: House/apartment Stairs: has ramp built Has following equipment at home:   BSC, steadi,  tub bench but can't access bathroom   PLOF: Independent  PATIENT GOALS: increase independence   OBJECTIVE:   HAND DOMINANCE: Right  ADLs: from initial eval Overall ADLs: assistance required, unable to access BR Transfers/ambulation related to ADLs: Eating: unable to cut food Grooming: set up UB Dressing: set up for bra and shirt  LB Dressing: max A Toileting: uses BSC uses steadi Bathing: min-mod A in hospital  bed  Tub Shower transfers: n/a Equipment: Emergency planning/management officer  IADLs:dependent for IADLS   MOBILITY STATUS:  uses w/c  , per PT report minguard for transfer from w/c to mat    ACTIVITY TOLERANCE: Activity tolerance: Pt fatigues quickly    UPPER EXTREMITY ROM:  see updates for 02/18/23 re-eval   Active ROM Right 02/18/23 Left 02/18/23  Shoulder flexion 130 120  Shoulder abduction 90 90  Shoulder adduction    Shoulder extension    Shoulder internal rotation    Shoulder external rotation    Elbow flexion    Elbow extension  -5  Wrist flexion    Wrist extension    Wrist ulnar deviation    Wrist radial deviation    Wrist pronation    Wrist supination    (Blank rows = not tested)  UPPER EXTREMITY MMT:   see below for re-eval 02/18/23  MMT Right 02/18/23 Left 02/18/23  Shoulder flexion 3+/5 3+/5   Shoulder abduction    Shoulder adduction    Shoulder extension    Shoulder internal rotation    Shoulder external rotation    Middle trapezius    Lower trapezius    Elbow flexion 4/5 4/5  Elbow extension 4-/5 4-/5  Wrist flexion    Wrist extension    Wrist ulnar deviation    Wrist radial deviation    Wrist pronation    Wrist supination      HAND FUNCTION: Grip strength: Right: 32 lbs; Left: 18 lbs, 02/18/23 R 55 lbs, L 35 lbs 05/13/23- grip RUE 42, LUE 34  COORDINATION: 9 Hole Peg test:initial eval  Right: 1 min 48 sec; Left: 4 pegs in 2 mins  Box and Blocks:  Right 35 blocks, Left  26 blocks 02/18/23- RUE 35.46, LUE 65 secs  SENSATION: Light touch: WFL Hot/Cold: Not tested    COGNITION: Overall cognitive status:NT   VISION: Subjective report: reports hx of glaucoma VISION ASSESSMENT: Not tested  OBSERVATIONS: Pt is eager to improve, pt's husband is very supportive   TODAY'S TREATMENT:                                                                                                                              DATE:07/24/23- arm bike x 6 mins level 1 for conditioning, with stockinette over right hand [place] , 0.8 w/cm 2, 20% to ulnar side of palm and wrist  for pain and inflammation, no adverse reactions. Ice pack applied to R hand x 8 mins while pt copied small peg design with LUE for increased fine motor coordination and functional use, min difficulty/drops. Pt reported no pain at the end of session. Pt was instructed to try icing her hand at home, with ice pack wrapped in a towel for 5-8 mins several times per day.  07/22/23-Pt returns to therapy after several weeks, she has been having  difficulty with receiving her IVIG treatment, has had illness and has had increased pain in RUE and difficulty weightbearing through her right hand. Therapist checked progress towards goals. Discussion with pt regarding plans to renew however pt will need to be able to demonstrate  progress to continue beyond new renewal period. Paraffin wax to RUE x 10 mins while pt performed fine motor coordination task LUE. No adverse reactions. Grooved pegs for LUE fine motor coordination, min difficulty, then removing with in hand manipulation, min difficulty. Standing to place graded clothespins on target with LUE 1-8 # for sustained pinch close supervision. Pt was instructed to stop using putty for RUE and to reduce her weightbearing though RUE to minimize pain. Therapist recommends pt sees MD regarding pain.   07/01/23- Arm bike x 6 mins level 1  Pt ambulated with supervision from arm bike to OT therapy room approximately 66ft with RW for endurance for ADLs Pt reported SOB after amb. HR 107, O2 sats 100% Pt rested for 5 mins and HR=95% Placing grooved pegs into pegboard with LUE for increased fine motor coordination, increased time and min-mod difficulty Gripper set at level 2 for sustained grip to pick up 1 inch blocks, with right and left UE's min difficulty/ drops.  06/26/23-Arm bike x 6 mins level 1 for conditioning Pt ambulated w/c to arm bike 10' then pt later ambulated down the hall approximately 50 ft  Pt copied small peg design for small peg design with left and right UE's for increased fine motor coordination, mod difficulty with LUE, min difficulty with right. Grip R 40 lbs, L 30 lbs Graded clothespins  1-8# for sustained pinch for LUE mod difficulty, RUE min difficulty.  Pt was able to ambulate back to main gym with supervision approximately 40 ft  to first mat from OT office with RW and supervision   PATIENT EDUCATION: Education details: Person educated: Patient Education method: explanation, demonstration Education comprehension: verbalized understanding, returned demonstration.  HOME EXERCISE PROGRAM:  12/17/22- coordination HEP, cane exercises seated   GOALS: Goals reviewed with patient? Yes  SHORT TERM GOALS: Target date: 08/20/23  I with initial  HEP Baseline: Goal status: met 01/21/23  2.  Pt will demonstrate improved UE functional use as evidenced increasing bilateral box/ blocks score by 4 blocks.  Baseline: R 35, L 26 Goal status  met 12/31/22 RUE :43:  LUE 35    3.  Pt will donn pants with mod A. Baseline: max A Goal status:met  02/18/23- pt performed mod I today  4. Pt will verbalize understanding of adapted strategies to maximize safety and I with ADLs/ IADLs .  Goal status:  met, pt has been using her tub bench  5.  Pt will stand for 5 mins prior to rest break in prep for ADLS. Revised goal: Pt will stand x 4 mins prior to rest break in prep for ADLS.  Goal status: ongoing, pt. stood for 2 mins 53 secs 07/22/23   6.  Pt will perform w/c to [facility] transfers mod I-  mod I per pt report 05/08/23  Goal status:  met, 02/18/23 7. Pt will increase bilateral grip strength by 5 lbs for increased functional use of UE's  Revised goal: Pt will demonstrate 33 lbs grip strength for RUE for increased functional use.    Goal status:  ongoing RUE 30 lbs,  LUE 28 lbs 07/22/23  8. Pt will demonstrate 31 lbs grip strength for LUE for increased functional use.  Baseline: 07/22/23 LUE  28 lbs  Goal status:met 35 lbs 08/26/23     LONG TERM GOALS: Target date: 09/23/23 I with updated HEP  Goal status: met, 07/22/23  2.  Pt will perform LB dressing mod I. Baseline:max A Goal status:met 03/25/23- 8/6- patient indicates she is dressing herself.   3.  Pt will bathe with supervision/ set up. Baseline: min-mod A for sponge bath Goal status: duplicate goal, therefore deferred,  see below, pt met for sponge bathing.  4. : Pt will perform basic cooking modified independently at a walker level. Revised goal: Pt will perform microwave cooking and sandwich prep at a walker level mod I Goal status:  Ongoing, pt is currently performing at a w/c level 08/26/23 not walking in kitchen consistently   5.  Pt will demonstrate improved fine motor  coordination as evidenced by performing 9 hole peg test in 55 secs or less ( for LUE)  Revised goal: Pt will demonstrate improved fine motor coordination as evidenced by performing LUE 9 hole peg test in 68 secs or less Goal status: met, 61 secs  6.  Pt will demonstrate improved fine motor coordination as evidenced by performing RUE 9 hole peg test in 1 mins  30 secs or less.  Baseline: RUE  1 min 48 secs, LUE 4 pegs placed in 2 mins  Goal status: met 02/18/23 RUE 35.46,     7. Pt will perform shower with supervision using tub transfer bench Goal status: met,pt performs mod I    8. Pt will perform basic home management at a walker level mod I    Revised goal: Pt will consistently wipe countertops off in standing and she will stand to fold laundry mod I.    Goal status: ongoing, 08/25/23 not consistent, wipes countertops off in standing occasionally   9. Pt will report RUE pain no greater than 4/10 for functional activity.                             Baseline: 6-8/10    Goal status:met, no pain today 08/26/23 CLINICAL IMPRESSION: For the reporting period of :  Pt is progressing towards goals. She met   PERFORMANCE DEFICITS: in functional skills including ADLs, IADLs, coordination, dexterity, sensation, ROM, strength, flexibility, Fine motor control, Gross motor control, mobility, balance, endurance, decreased knowledge of precautions, decreased knowledge of use of DME, and UE functional use, cognitive skills including  and psychosocial skills including coping strategies, environmental adaptation, habits, interpersonal interactions, and routines and behaviors.   IMPAIRMENTS: are limiting patient from ADLs, IADLs, rest and sleep, play, leisure, and social participation.   CO-MORBIDITIES: may have co-morbidities  that affects occupational performance. Patient will benefit from skilled OT to address above impairments and improve overall function.  MODIFICATION OR ASSISTANCE TO COMPLETE EVALUATION:  No modification of tasks or assist necessary to complete an evaluation.  OT OCCUPATIONAL PROFILE AND HISTORY: Detailed assessment: Review of records and additional review of physical, cognitive, psychosocial history related to current functional performance.  CLINICAL DECISION MAKING: LOW - limited treatment options, no task modification necessary  REHAB POTENTIAL: Good  EVALUATION COMPLEXITY: Low    PLAN:  OT FREQUENCY:1-2x week  OT DURATION: 9 weeks  PLANNED INTERVENTIONS: self care/ADL training, therapeutic exercise, therapeutic activity, neuromuscular re-education, manual therapy, passive range of motion, gait training, balance training, stair training, functional mobility training, aquatic therapy, ultrasound, paraffin, moist heat, cryotherapy, patient/family education, energy conservation, coping strategies training, DME and/or AE instructions,  and Re-evaluation  RECOMMENDED OTHER SERVICES: PT  CONSULTED AND AGREED WITH PLAN OF CARE: Patient and family member/caregiver  PLAN FOR NEXT SESSION: continue to work towards updated goals, address hand pain, coordination, standing tolerance for ADLs,   Rece Zechman, OT 08/26/2023, 8:02 AM

## 2023-08-26 NOTE — Therapy (Signed)
OUTPATIENT PHYSICAL THERAPY LOWER EXTREMITY TREATMENT   Patient Name: Dana [name] MRN: [id number] DOB:[birth year], 57 y.o., female Today's Date: 08/26/2023  END OF SESSION:  PT End of Session - 08/26/23 0929     Visit Number 28    Date for PT Re-Evaluation 10/21/23    PT Start Time 0932    PT Stop Time 1015    PT Time Calculation (min) 43 min    Equipment Utilized During Treatment Gait belt    Activity Tolerance Patient tolerated treatment well    Behavior During Therapy Dana [name] for tasks assessed/performed            Past Medical History:  Diagnosis Date   Anxiety    Back pain with radiation    Breast mass, right    Chronic pain    Chronic, continuous use of opioids    Complication of anesthesia 04/07/14   Allergic reaction to Lisinopril immediately following surgery   DJD (degenerative joint disease)    Fibromyalgia    Groin abscess    Headache(784.0)    History of IBS    Hypercholesterolemia    IBS (irritable bowel syndrome)    Lactose intolerance    Mild hypertension    Obesity    Tobacco use disorder    Umbilical hernia    Symptomatic   Past Surgical History:  Procedure Laterality Date   ABDOMINAL HYSTERECTOMY     ANTERIOR CERVICAL DECOMP/DISCECTOMY FUSION N/A 12/20/2015   Procedure: ANTERIOR CERVICAL DECOMPRESSION FUSION CERVICAL 4-5, CERVICAL 5-6, CERVICAL 6-7 WITH INSTRUMENTATION AND ALLOGRAFT;  Surgeon: Dana Bamberg, MD;  Location: [facility];  Service: Orthopedics;  Laterality: N/A;  Anterior cervical decompression fusion, cervical 4-5, cervical 5-6, cervical 6-7 with instrumentation and allograft   BACK SURGERY     BILATERAL SALPINGECTOMY  09/03/2012   Procedure: BILATERAL SALPINGECTOMY;  Surgeon: Dana Edwards, MD;  Location: [facility];  Service: Gynecology;  Laterality: Bilateral;   BREAST BIOPSY Right 04/07/2014   Procedure: REMOVAL RIGHT BREAST MASS WITH WIRE LOCALIZATION;  Surgeon: Dana Pollack, MD;  Location: [name];  Service: General;   Laterality: Right;   BREAST EXCISIONAL BIOPSY Left    x2   BREAST LUMPECTOMY     x2   CARPAL TUNNEL RELEASE Left 05/11/2019   Procedure: LEFT CARPAL TUNNEL RELEASE, RIGHT TENNIS ELBOW MARCAINE/DEPO MEDROL INJECTION UNDER ANESTHESIA;  Surgeon: Dana Champagne, MD;  Location: [facility];  Service: Orthopedics;  Laterality: Left;   COLONOSCOPY W/ BIOPSIES  04/24/2012   per Dr. Leone [name], clear, repeat in 10 yrs    disectomy     ESOPHAGOGASTRODUODENOSCOPY     FINGER SURGERY     Right index-excision of mass    FOOT SURGERY Right    Bone Spurs   IR FLUORO GUIDE CV LINE RIGHT  01/29/2023   IR REMOVAL TUN CV CATH W/O FL  02/08/2023   IR US GUIDE VASC ACCESS RIGHT  01/31/2023   LAPAROSCOPIC HYSTERECTOMY  09/03/2012   Procedure: HYSTERECTOMY TOTAL LAPAROSCOPIC;  Surgeon: Dana Edwards, MD;  Location: [facility];  Service: Gynecology;  Laterality: N/A;   LUMBAR DISC SURGERY     TUBAL LIGATION     UMBILICAL HERNIA REPAIR N/A 05/01/2018   Procedure: UMBILICAL HERNIA REPAIR;  Surgeon: Dana Norman, MD;  Location: [name];  Service: General;  Laterality: N/A;   Patient Active Problem List   Diagnosis Date Noted   Hyperkeratosis of sole 08/01/2023   CIDP (chronic inflammatory demyelinating polyneuropathy) (HCC) 01/28/2023   Gait  abnormality 01/28/2023   Glaucoma 01/28/2023   Pain due to onychomycosis of toenails of both feet 01/21/2023   Wheelchair dependence 12/30/2022   Nerve pain 12/30/2022   Insomnia due to medical condition 12/30/2022   Acute inflammatory demyelinating polyneuropathy (HCC) 10/01/2022   UTI (urinary tract infection) 09/24/2022   Hypomagnesemia 09/24/2022   Weakness 09/23/2022   Hypokalemia 09/23/2022   B12 deficiency 09/05/2022   Folate deficiency 09/05/2022   Carpal tunnel syndrome, left upper limb 05/11/2019    Class: Chronic   Spinal stenosis of lumbar region with neurogenic claudication 08/20/2018   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   Primary osteoarthritis of right  knee 02/27/2017   Right tennis elbow 11/11/2016   Muscle cramps 01/15/2016   Bilateral leg edema 01/08/2016   Radiculopathy 12/20/2015   Hyperglycemia 12/21/2014   Mastodynia, female 11/30/2014   S/P excision of fibroadenoma of breast 11/30/2014   Angioedema of lips 04/07/2014   Fibroadenoma of right breast 03/07/2014   Pelvic pain in female 06/19/2012   Menorrhagia 06/11/2012   SUI (stress urinary incontinence, female) 06/11/2012   HTN (hypertension) 06/11/2012   IBS (irritable bowel syndrome) - diarrhea predominant 03/17/2012   MICROSCOPIC HEMATURIA 11/30/2010   BREAST PAIN, RIGHT 11/30/2010   BREAST MASS, RIGHT 01/12/2010   HYPERCHOLESTEROLEMIA 12/07/2007   CIGARETTE SMOKER 12/07/2007   Osteoarthritis 12/07/2007   Low back pain with sciatica 12/07/2007   Headache 12/07/2007   Obesity, Class III, BMI 40-49.9 (morbid obesity) (HCC) 08/03/2007   Anxiety state 08/03/2007    PCP: Dana Salisbury, MD  REFERRING PROVIDER: Nelwyn Salisbury, MD  REFERRING DIAG: G61.0 (ICD-10-CM) - Guillain Barr syndrome [facility])   THERAPY DIAG:  Muscle weakness (generalized)  Other lack of coordination  Unsteadiness on feet  CIDP (chronic inflammatory demyelinating polyneuropathy) (HCC)  Difficulty in walking, not elsewhere classified  Rationale for Evaluation and Treatment: Rehabilitation  ONSET DATE: 12/05/22  SUBJECTIVE:   SUBJECTIVE STATEMENT: Patient reports that she had an episode of cellulitis in her foot, which prevented her from coming to therapy. She has had 2 day H/O abdominal and R arm muscle spasms.  OBJECTIVE STATEMENT: 01/28/23 ASSESSMENT AND PLAN   Dana [name] is a 57 y.o. female   Demyelinating polyradiculoneuropathy             Symptom onset monophasic since September 2024, failed to respond to IVIG in January 2024,             EMG nerve conduction study showed demyelinating features, no evidence of axonal loss,             Talk with neurohospitalist Dr. Iver [name],  ED triage, will send her for [name] admission for plasma exchange, please add on following labs, immunofixative protein electrophoresis, ANA with reflex, SSA, SSB titer, iron panel             Starting outpatient IVIG prior authorization process,  KIMBERLEY GUMMO is a 57 y.o. female who [facility] ED on 09/23/2022 with bilateral lower extremity weakness with decreased mobility that has progressed to both arms. She also reported numbness and tingling of the extremities. She was transferred to [facility] for MRI. 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  Inpatient Rehab F/B HHPT.  R knee injection 10/08/22 trigger point injections for worsening back pain 2/15  RA  PAIN:  Are you having pain? Yes: NPRS scale: up to 10/10 Pain location: all over, but her R knee pain is worst,  Pain description: Lambert Mody  Aggravating factors: Has difficulty standing up Relieving factors: Medicine helps somewhat  PRECAUTIONS: Fall  WEIGHT BEARING RESTRICTIONS: No  FALLS:  Has patient fallen in last 6 months? No  LIVING ENVIRONMENT: Lives with: lives with their family Lives in: House/apartment Stairs: Nohas a ramp Has following equipment at home: Environmental consultant - 2 wheeled, Wheelchair (power), Tour manager, bed side commode, Ramped entry, and [employer] lift  OCCUPATION: on disability due to RA and a fall several years ago  PLOF: Independent  PATIENT GOALS: walk  NEXT MD VISIT: She is scheduled to follow up with Dr. Serita Sheller with Physical Med/Rehab on 12-30-22. She is scheduled to follow up with Dr. Levert Feinstein at Neurology on 01-28-23.   OBJECTIVE:   DIAGNOSTIC FINDINGS:  Right knee radiographs 11/09/2020   FINDINGS: Severe patellofemoral joint space narrowing. Severe superior and mild inferior patellar degenerative osteophytosis. Moderate superior trochlear degenerative osteophytosis. No joint effusion. Severe medial compartment joint space narrowing with moderate peripheral  medial and lateral compartment degenerative osteophytes.  COGNITION: Overall cognitive status: Within functional limits for tasks assessed     SENSATION: Light touch: Impaired  and B hands and feet and lower legs  EDEMA:  BLE edema noted.  MUSCLE LENGTH: Hamstrings: WFL Thomas test: B tightness, to neutral  POSTURE: rounded shoulders and flexed trunk   PALPATION: No TTP  LOWER EXTREMITY ROM: BLE ROM limited in all planes an all joints due to body habitus   LOWER EXTREMITY MMT:  MMT Right eval Left eval R 05/01/23 L 05/01/23 R/L 06/05/23  Hip flexion 3 3- 4- 3+ 4/4  Hip extension 3- 3- 3 3 3   Hip abduction 3 3- 3+ 3+ 4-/3+  Hip adduction       Hip internal rotation       Hip external rotation       Knee flexion 4 4- 4 4 4+/4+  Knee extension 4- 4- 4 4 4+/4+  Ankle dorsiflexion 4- 4- 4 4 4/4  Ankle plantarflexion       Ankle inversion 4- 4-     Ankle eversion 4- 4-      (Blank rows = not tested)  FUNCTIONAL TESTS:  5 times sit to stand: unable to complete Timed up and go (TUG): unable to complete,  04/22/23  TUG 63 seconds with FWW  Bed Mobility: Occasional min A for sup <> sit, rolled B on mat with great effort, light min A  TRANSFERS: Scooting transfer with CGA, stand pivot with CGA, she reports that she sometimes has difficulty rising due to her knee pain  GAIT: Distance walked: 0' Assistive device utilized:  shopping cart-will bring her RW from home. Level of assistance: Min A Comments: Stood and weight shifted, then stood again and took steps to turn to W/C.  TODAY'S TREATMENT:  DATE:  08/26/23 NuStep L5 x 2 minutes- had to stop due to abdominal cramping. Mini squats at mat x 5 Standing side to side steps, increasing speed as she progressed x 5 Standing step back and return to each side. X 5 Heel raises x 10  07/23/24 NuStep L5 x  6 minutes Seated rotational reach to the side, 2 x 5 each way. Mini squats while holding 4# ball, 2 x 5 reps B side step with weight shift and return to center, 2 x 6 reps with RW and CGA. Seated long kicks with 3# on each leg 2 x 10 reps each Stand pivot transfers x 4 with RW and S.  07/22/23 NuStep L5 x 6 minutes Goals re-assessed for [employer], 2 x 10 with 2 blue bands, each leg. Alternating step taps on 4" step with BUE support on RW, but trying to minimize UE support, CGA Standing heel raises x 10, B. Standing step back and around, reach back, then return to center. X 8 each direction Walk x 5' to W/C using RW and CGA.  07/01/23 NuStep L5 x 6 minutes- attempted bike, but she reported severe calf pain. Activity stress test performed due to patient's reports of severe SOB and racing heart and overheating with gait activities.  NuStep, progressive WATTS. HR, SATS, RPE assessed each 2 minutes. Rest- SATS/HR/RPE 100/91 2 min at 25-30 W 98/10/3 2 min at 35-40 W 98/111/5 2 min at 35-40 W 97/114/10 stopped Ambulation x 25' with RW SATS/HR 100/117  06/26/23 NuStep L4 x 6 minutes Alternately tapping 6" step with BUE support on RW, attempting to decrease reliance on UE, 15 each leg, more challenging standing on R. Standing B weight shifts over BOS, then quick step taps to each side. Performed balance training- Step forward with RLE, reach to pick up cone, then step back and turn R leg out, with weight shift back onto the R leg, reaching to place on mat. Repeated x 6 each direction, no UE support. B side stepping on airex pad in parallel bars x 1, challenging.  PATIENT EDUCATION:  Education details: POC Person educated: Patient Education method: Explanation Education comprehension: verbalized understanding  HOME EXERCISE PROGRAM: [id number]  ASSESSMENT:  CLINICAL IMPRESSION: Patient reports that she is using her RW at home daily. She had an episode of R foot cellulitis  which impeded her from moving for several weeks. She also has had 2 day H/O abdominal cramping which impeded participation today. Updated HEP to include standing activities that she can perform at home. She is concerned about her SOB, but feels it is probably due to being out of shape and she does recover once she rests. She is going to contact the Dr about it due to fear of illness. Patient will perform HEP at least 5 x/week for the next several weeks and walk daily for endurance before returning for therapy with a re-assessment of her status to determine progress.  OBJECTIVE IMPAIRMENTS: Abnormal gait, decreased activity tolerance, decreased balance, decreased coordination, decreased endurance, difficulty walking, decreased ROM, decreased strength, and postural dysfunction.   ACTIVITY LIMITATIONS: carrying, lifting, bending, standing, squatting, stairs, transfers, and locomotion level  PARTICIPATION LIMITATIONS: meal prep, cleaning, laundry, driving, and shopping  PERSONAL FACTORS: Fitness, Past/current experiences, and 1 comorbidity: Recently diagnosed with GBS  are also affecting patient's functional outcome.   REHAB POTENTIAL: Good  CLINICAL DECISION MAKING: Evolving/moderate complexity  EVALUATION COMPLEXITY: Moderate  GOALS: Goals reviewed with patient? Yes  SHORT TERM GOALS: Target date:  11/28/22 I with initial HEP Baseline: Goal status: met 01/09/23  LONG TERM GOALS: Target date: 03/06/23  I with final HEP Baseline:  Goal status: 06/05/23, ongoing  2.  Decrease 5x STS to < 14 sec Baseline: unable to complete Goal status: 08/26/23-22, ongoing  3.  Decrease TUG to < 20 sec Baseline: Unable to complete. Goal status: 02/18/23 1:55 04/01/23 = 65 seconds, 08/26/23-34 sec, ongoing   4.  Patient will ambulate at least 300' with LRAD, MI, on level and unlevel surfaces. Baseline: Stand pivot transfer with CGA Goal status: 02/18/23-20', RW, CGA, slow, but stable throughout.  06/05/23-100', RW, ongoing, 07/22/23- N/T for distance due to R hand pain when using RW.  5.  Perform all bed mobility with I Baseline: Occ min A on mat Goal status: 02/18/23-met  6.  Increase BLE strength to at least 4/5 throughout. Baseline: (3-)-3/5 Goal status: 06/05/23-See MMT, ongoing. 07/22/23-4-/5, ongoing  7. Patient and husband will safely perform car transfers with LRAD Baseline: Currently uses van transportation Goal Status: 02/25/23 met  PLAN:  PT FREQUENCY: 1-2x/week  PT DURATION: 12 weeks  PLANNED INTERVENTIONS: Therapeutic exercises, Therapeutic activity, Neuromuscular re-education, Balance training, Gait training, Patient/Family education, Self Care, Joint mobilization, Stair training, Dry Needling, Electrical stimulation, Cryotherapy, Moist heat, Ultrasound, Ionotophoresis 4mg /ml Dexamethasone, and Manual therapy  PLAN FOR NEXT SESSION: re-assess her status after 4 week HEP program.   Oley Balm DPT 08/26/23 10:22 AM

## 2023-08-29 ENCOUNTER — Encounter: Payer: Self-pay | Admitting: Family Medicine

## 2023-08-29 ENCOUNTER — Telehealth (INDEPENDENT_AMBULATORY_CARE_PROVIDER_SITE_OTHER): Payer: 59 | Admitting: Family Medicine

## 2023-08-29 DIAGNOSIS — G8929 Other chronic pain: Secondary | ICD-10-CM

## 2023-08-29 DIAGNOSIS — F1190 Opioid use, unspecified, uncomplicated: Secondary | ICD-10-CM

## 2023-08-29 DIAGNOSIS — M5440 Lumbago with sciatica, unspecified side: Secondary | ICD-10-CM

## 2023-08-29 MED ORDER — HYDROMORPHONE HCL 8 MG PO TABS: 8.0000 mg | ORAL_TABLET | Freq: Four times a day (QID) | ORAL | 0 refills | Status: DC | PRN

## 2023-08-29 NOTE — Progress Notes (Signed)
Subjective:    Patient ID: [name], female    DOB: 27-Apr-1966, 57 y.o.   MRN: [id number]  HPI Virtual Visit via Video Note  I connected with the patient on 08/29/23 at 11:00 AM EST by a video enabled telemedicine application and verified that I am speaking with the correct person using two identifiers.  Location patient: home Location provider:work or home office Persons participating in the virtual visit: patient, provider  I discussed the limitations of evaluation and management by telemedicine and the availability of in person appointments. The patient expressed understanding and agreed to proceed.   HPI: Here for pain management. She is doing well with pain control. She is getting stronger with PT as well.    ROS: See pertinent positives and negatives per HPI.  Past Medical History:  Diagnosis Date   Anxiety    Back pain with radiation    Breast mass, right    Chronic pain    Chronic, continuous use of opioids    Complication of anesthesia 04/07/14   Allergic reaction to Lisinopril immediately following surgery   DJD (degenerative joint disease)    Fibromyalgia    Groin abscess    Headache(784.0)    History of IBS    Hypercholesterolemia    IBS (irritable bowel syndrome)    Lactose intolerance    Mild hypertension    Obesity    Tobacco use disorder    Umbilical hernia    Symptomatic    Past Surgical History:  Procedure Laterality Date   ABDOMINAL HYSTERECTOMY     ANTERIOR CERVICAL DECOMP/DISCECTOMY FUSION N/A 12/20/2015   Procedure: ANTERIOR CERVICAL DECOMPRESSION FUSION CERVICAL 4-5, CERVICAL 5-6, CERVICAL 6-7 WITH INSTRUMENTATION AND ALLOGRAFT;  Surgeon: Estill Bamberg, MD;  Location: [facility] OR;  Service: Orthopedics;  Laterality: N/A;  Anterior cervical decompression fusion, cervical 4-5, cervical 5-6, cervical 6-7 with instrumentation and allograft   BACK SURGERY     BILATERAL SALPINGECTOMY  09/03/2012   Procedure: BILATERAL SALPINGECTOMY;  Surgeon: Ok Edwards, MD;  Location: [facility];  Service: Gynecology;  Laterality: Bilateral;   BREAST BIOPSY Right 04/07/2014   Procedure: REMOVAL RIGHT BREAST MASS WITH WIRE LOCALIZATION;  Surgeon: Adolph Pollack, MD;  Location: [facility] OR;  Service: General;  Laterality: Right;   BREAST EXCISIONAL BIOPSY Left    x2   BREAST LUMPECTOMY     x2   CARPAL TUNNEL RELEASE Left 05/11/2019   Procedure: LEFT CARPAL TUNNEL RELEASE, RIGHT TENNIS ELBOW MARCAINE/DEPO MEDROL INJECTION UNDER ANESTHESIA;  Surgeon: Kerrin Champagne, MD;  Location: [facility] OR;  Service: Orthopedics;  Laterality: Left;   COLONOSCOPY W/ BIOPSIES  04/24/2012   per Dr. Leone Payor, clear, repeat in 10 yrs    disectomy     ESOPHAGOGASTRODUODENOSCOPY     FINGER SURGERY     Right index-excision of mass    FOOT SURGERY Right    Bone Spurs   IR FLUORO GUIDE CV LINE RIGHT  01/29/2023   IR REMOVAL TUN CV CATH W/O FL  02/08/2023   IR US GUIDE VASC ACCESS RIGHT  01/31/2023   LAPAROSCOPIC HYSTERECTOMY  09/03/2012   Procedure: HYSTERECTOMY TOTAL LAPAROSCOPIC;  Surgeon: Ok Edwards, MD;  Location: [facility];  Service: Gynecology;  Laterality: N/A;   LUMBAR DISC SURGERY     TUBAL LIGATION     UMBILICAL HERNIA REPAIR N/A 05/01/2018   Procedure: UMBILICAL HERNIA REPAIR;  Surgeon: Jimmye Norman, MD;  Location: [facility] OR;  Service: General;  Laterality: N/A;  Family History  Problem Relation Age of Onset   Diabetes Mother    Diabetes Father    Breast cancer Maternal Aunt 46   Prostate cancer Paternal Grandfather      Current Outpatient Medications:    ALPRAZolam (XANAX) 0.5 MG tablet, Take 1 tablet (0.5 mg total) by mouth 2 (two) times daily as needed for anxiety., Disp: 180 tablet, Rfl: 1   ammonium lactate (LAC-HYDRIN) 12 % lotion, Apply 1 Application topically in the morning and at bedtime., Disp: 225 g, Rfl: 5   cephALEXin (KEFLEX) 500 MG capsule, Take 1 capsule (500 mg total) by mouth 4 (four) times daily., Disp: 40 capsule, Rfl: 0   Cyanocobalamin  (VITAMIN B-12 PO), Take 1 tablet by mouth daily., Disp: , Rfl:    cyclobenzaprine (FLEXERIL) 10 MG tablet, TAKE 1 TABLET BY MOUTH THREE TIMES A DAY, Disp: 90 tablet, Rfl: 0   dorzolamide-timolol (COSOPT) 2-0.5 % ophthalmic solution, Place 1 drop into both eyes 2 (two) times daily., Disp: 10 mL, Rfl: 1   famotidine (PEPCID) 20 MG tablet, Take 20 mg by mouth 2 (two) times daily., Disp: , Rfl:    fluticasone (FLONASE) 50 MCG/ACT nasal spray, SPRAY 2 SPRAYS INTO EACH NOSTRIL EVERY DAY, Disp: 16 mL, Rfl: 11   folic acid (FOLVITE) 1 MG tablet, Take 1 tablet (1 mg total) by mouth daily., Disp: 90 tablet, Rfl: 3   furosemide (LASIX) 40 MG tablet, Take 1 tablet (40 mg total) by mouth 2 (two) times daily., Disp: 180 tablet, Rfl: 3   gabapentin (NEURONTIN) 300 MG capsule, Take 3 capsules (900 mg total) by mouth 3 (three) times daily., Disp: 270 capsule, Rfl: 5   hydrocerin (EUCERIN) CREA, Apply 1 Application topically 2 (two) times daily., Disp: , Rfl: 0   ibuprofen (ADVIL) 200 MG tablet, Take 400 mg by mouth 2 (two) times daily as needed for headache or moderate pain., Disp: , Rfl:    latanoprost (XALATAN) 0.005 % ophthalmic solution, Place 1 drop into both eyes at bedtime., Disp: 2.5 mL, Rfl: 1   magnesium oxide (MAG-OX) 400 MG tablet, Take 1 tablet (400 mg total) by mouth at bedtime. (Patient taking differently: Take 200 mg by mouth at bedtime.), Disp: 90 tablet, Rfl: 3   metoprolol tartrate (LOPRESSOR) 50 MG tablet, Take 1 tablet (50 mg total) by mouth 2 (two) times daily., Disp: 180 tablet, Rfl: 3   Multiple Vitamin (MULTIVITAMIN WITH MINERALS) TABS tablet, Take 1 tablet by mouth daily., Disp: 30 tablet, Rfl: 0   Oxycodone HCl 20 MG TABS, Take 1 tablet (20 mg total) by mouth every 6 (six) hours as needed (pain)., Disp: 120 tablet, Rfl: 0   pantoprazole (PROTONIX) 40 MG tablet, Take 1 tablet (40 mg total) by mouth daily., Disp: 90 tablet, Rfl: 3   potassium chloride (KLOR-CON) 10 MEQ tablet, Take 10 mEq by  mouth 2 (two) times daily., Disp: , Rfl:    promethazine (PHENERGAN) 25 MG tablet, Take 25 mg by mouth every 6 (six) hours as needed for nausea or vomiting., Disp: , Rfl:    senna-docusate (SENOKOT-S) 8.6-50 MG tablet, Take 1 tablet by mouth 2 (two) times daily. (Patient taking differently: Take 1 tablet by mouth 2 (two) times daily as needed for moderate constipation.), Disp: , Rfl:    traZODone (DESYREL) 50 MG tablet, TAKE 1 TO 2 TABLETS BY MOUTH AT BEDTIME AS NEEDED FOR SLEEP, Disp: 180 tablet, Rfl: 1  EXAM:  VITALS per patient if applicable:  GENERAL: alert, oriented,  appears well and in no acute distress  HEENT: atraumatic, conjunttiva clear, no obvious abnormalities on inspection of external nose and ears  NECK: normal movements of the head and neck  LUNGS: on inspection no signs of respiratory distress, breathing rate appears normal, no obvious gross SOB, gasping or wheezing  CV: no obvious cyanosis  MS: moves all visible extremities without noticeable abnormality  PSYCH/NEURO: pleasant and cooperative, no obvious depression or anxiety, speech and thought processing grossly intact  ASSESSMENT AND PLAN: Pain management. Indication for chronic opioid: low back pain Medication and dose: Hydromorphone 8 mg # pills per month: 120 Last UDS date: 12-11-22 Opioid Treatment Agreement signed (Y/N): 12-19-17 Opioid Treatment Agreement last reviewed with patient:  08-29-23 [facility] reviewed this encounter (include red flags): Yes Meds were refilled. Gershon Crane, MD  Discussed the following assessment and plan:  No diagnosis found.     I discussed the assessment and treatment plan with the patient. The patient was provided an opportunity to ask questions and all were answered. The patient agreed with the plan and demonstrated an understanding of the instructions.   The patient was advised to call back or seek an in-person evaluation if the symptoms worsen or if the condition fails to  improve as anticipated.      Review of Systems     Objective:   Physical Exam        Assessment & Plan:

## 2023-09-15 ENCOUNTER — Ambulatory Visit: Payer: 59 | Admitting: Surgical

## 2023-09-22 ENCOUNTER — Ambulatory Visit: Payer: 59 | Admitting: Surgical

## 2023-09-22 NOTE — Therapy (Signed)
 OUTPATIENT OCCUPATIONAL THERAPY NEURO TREATMENT   Patient Name: Dana [name] MRN: [id number] DOB:[birth year], 58 y.o., female Today's Date: 09/23/2023  PCP: Dr. Johnny REFERRING PROVIDER: Dr. Garnette Johnny  END OF SESSION:  OT End of Session - 09/23/23 0810     Visit Number 31    Number of Visits 39    Date for OT Re-Evaluation 11/18/23    Authorization Type UHC, Medicare    Authorization Time Period KX at visit 50, new year    Authorization - Visit Number 31   corrected count   Progress Note Due on Visit 41   renewal completed on visit 31   OT Start Time 0843    OT Stop Time 0923    OT Time Calculation (min) 40 min    Activity Tolerance Patient tolerated treatment well;Patient limited by pain    Behavior During Therapy [facility] for tasks assessed/performed                           Past Medical History:  Diagnosis Date   Anxiety    Back pain with radiation    Breast mass, right    Chronic pain    Chronic, continuous use of opioids    Complication of anesthesia 04/07/14   Allergic reaction to Lisinopril  immediately following surgery   DJD (degenerative joint disease)    Fibromyalgia    Groin abscess    Headache(784.0)    History of IBS    Hypercholesterolemia    IBS (irritable bowel syndrome)    Lactose intolerance    Mild hypertension    Obesity    Tobacco use disorder    Umbilical hernia    Symptomatic   Past Surgical History:  Procedure Laterality Date   ABDOMINAL HYSTERECTOMY     ANTERIOR CERVICAL DECOMP/DISCECTOMY FUSION N/A 12/20/2015   Procedure: ANTERIOR CERVICAL DECOMPRESSION FUSION CERVICAL 4-5, CERVICAL 5-6, CERVICAL 6-7 WITH INSTRUMENTATION AND ALLOGRAFT;  Surgeon: Oneil Priestly, MD;  Location: [facility] OR;  Service: Orthopedics;  Laterality: N/A;  Anterior cervical decompression fusion, cervical 4-5, cervical 5-6, cervical 6-7 with instrumentation and allograft   BACK SURGERY     BILATERAL SALPINGECTOMY  09/03/2012   Procedure: BILATERAL  SALPINGECTOMY;  Surgeon: Curlee VEAR Guan, MD;  Location: [facility] ORS;  Service: Gynecology;  Laterality: Bilateral;   BREAST BIOPSY Right 04/07/2014   Procedure: REMOVAL RIGHT BREAST MASS WITH WIRE LOCALIZATION;  Surgeon: Krystal JINNY Russell, MD;  Location: [facility] OR;  Service: General;  Laterality: Right;   BREAST EXCISIONAL BIOPSY Left    x2   BREAST LUMPECTOMY     x2   CARPAL TUNNEL RELEASE Left 05/11/2019   Procedure: LEFT CARPAL TUNNEL RELEASE, RIGHT TENNIS ELBOW MARCAINE /DEPO MEDROL  INJECTION UNDER ANESTHESIA;  Surgeon: Lucilla Lynwood BRAVO, MD;  Location: [facility] OR;  Service: Orthopedics;  Laterality: Left;   COLONOSCOPY W/ BIOPSIES  04/24/2012   per Dr. Avram, clear, repeat in 10 yrs    disectomy     ESOPHAGOGASTRODUODENOSCOPY     FINGER SURGERY     Right index-excision of mass    FOOT SURGERY Right    Bone Spurs   IR FLUORO GUIDE CV LINE RIGHT  01/29/2023   IR REMOVAL TUN CV CATH W/O FL  02/08/2023   IR US  GUIDE VASC ACCESS RIGHT  01/31/2023   LAPAROSCOPIC HYSTERECTOMY  09/03/2012   Procedure: HYSTERECTOMY TOTAL LAPAROSCOPIC;  Surgeon: Curlee VEAR Guan, MD;  Location: [facility] ORS;  Service: Gynecology;  Laterality: N/A;  LUMBAR DISC SURGERY     TUBAL LIGATION     UMBILICAL HERNIA REPAIR N/A 05/01/2018   Procedure: UMBILICAL HERNIA REPAIR;  Surgeon: Kimble Agent, MD;  Location: [facility] OR;  Service: General;  Laterality: N/A;   Patient Active Problem List   Diagnosis Date Noted   Hyperkeratosis of sole 08/01/2023   CIDP (chronic inflammatory demyelinating polyneuropathy) (HCC) 01/28/2023   Gait abnormality 01/28/2023   Glaucoma 01/28/2023   Pain due to onychomycosis of toenails of both feet 01/21/2023   Wheelchair dependence 12/30/2022   Nerve pain 12/30/2022   Insomnia due to medical condition 12/30/2022   Acute inflammatory demyelinating polyneuropathy (HCC) 10/01/2022   UTI (urinary tract infection) 09/24/2022   Hypomagnesemia 09/24/2022   Weakness 09/23/2022   Hypokalemia 09/23/2022   B12  deficiency 09/05/2022   Folate deficiency 09/05/2022   Carpal tunnel syndrome, left upper limb 05/11/2019    Class: Chronic   Spinal stenosis of lumbar region with neurogenic claudication 08/20/2018   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   Primary osteoarthritis of right knee 02/27/2017   Right tennis elbow 11/11/2016   Muscle cramps 01/15/2016   Bilateral leg edema 01/08/2016   Radiculopathy 12/20/2015   Hyperglycemia 12/21/2014   Mastodynia, female 11/30/2014   S/P excision of fibroadenoma of breast 11/30/2014   Angioedema of lips 04/07/2014   Fibroadenoma of right breast 03/07/2014   Pelvic pain in female 06/19/2012   Menorrhagia 06/11/2012   SUI (stress urinary incontinence, female) 06/11/2012   HTN (hypertension) 06/11/2012   IBS (irritable bowel syndrome) - diarrhea predominant 03/17/2012   MICROSCOPIC HEMATURIA 11/30/2010   BREAST PAIN, RIGHT 11/30/2010   BREAST MASS, RIGHT 01/12/2010   HYPERCHOLESTEROLEMIA 12/07/2007   CIGARETTE SMOKER 12/07/2007   Osteoarthritis 12/07/2007   Low back pain with sciatica 12/07/2007   Headache 12/07/2007   Obesity, Class III, BMI 40-49.9 (morbid obesity) (HCC) 08/03/2007   Anxiety state 08/03/2007    ONSET DATE: 12/05/22- referral date  REFERRING DIAG: G61.0 (ICD-10-CM) - Guillain Barr syndrome (HCC)  THERAPY DIAG:  Muscle weakness (generalized) - Plan: Ot plan of care cert/re-cert  Other lack of coordination - Plan: Ot plan of care cert/re-cert  Unsteadiness on feet - Plan: Ot plan of care cert/re-cert  Other abnormalities of gait and mobility - Plan: Ot plan of care cert/re-cert    Rationale for Evaluation and Treatment: Rehabilitation  SUBJECTIVE:   SUBJECTIVE STATEMENT: Pt reports she is scheduled for her IVIG treatment tomorrow. PERTINENT HISTORY: Pt is a 58 y/o female hospitalized 09/23/22  with progressive weakness and sensation changes, consistent with Guillain-Barr syndrome. MRI negative. LP results and  assessment most consistent with Guillain-Barr syndrome.  Pt was d/c home from [facility] 11/06/22 PMH includes: glaucoma anxiety, DJD, fibromyalgia, HTN, tobacco use, B-12 deficiency, OA, ACDF 2017.  Pt was re-hospitalized 01/28/2456 with CIDP (chronic inflammatory demyelinating polyneuropathy). Saw Neuro on day of admission, NCS showed demyelination, referred to hospital for PLEX. Initially diagnosed with an acute demyelinating neuropathy Jan 2024, treated with IVIG. PMH: Spinal stenosis, HTN, Obesity   PRECAUTIONS: Fall  WEIGHT BEARING RESTRICTIONS: no  PAIN:  Are you having pain? Yes: NPRS scale: 5/10 Pain location: knees Pain description: aching, sharp  Aggravating factors: weightbearing Relieving factors: massage       FALLS: Has patient fallen in last 6 months? No  LIVING ENVIRONMENT: Lives with: lives with their spouse Lives in: House/apartment Stairs: has ramp built Has following equipment at home:   BSC, steadi,  tub bench but can't access bathroom  PLOF: Independent  PATIENT GOALS: increase independence   OBJECTIVE:   HAND DOMINANCE: Right  ADLs: from initial eval Overall ADLs: assistance required, unable to access BR Transfers/ambulation related to ADLs: Eating: unable to cut food Grooming: set up UB Dressing: set up for bra and shirt LB Dressing: max A Toileting: uses BSC uses steadi Bathing: min-mod A in hospital  bed  Tub Shower transfers: n/a Equipment: Emergency planning/management officer  IADLs:dependent for IADLS   MOBILITY STATUS:  uses w/c  , per PT report minguard for transfer from w/c to mat    ACTIVITY TOLERANCE: Activity tolerance: Pt fatigues quickly    UPPER EXTREMITY ROM:  see updates for 02/18/23 re-eval   Active ROM Right 02/18/23 Left 02/18/23  Shoulder flexion 130 120  Shoulder abduction 90 90  Shoulder adduction    Shoulder extension    Shoulder internal rotation    Shoulder external rotation    Elbow flexion    Elbow extension  -5  Wrist  flexion    Wrist extension    Wrist ulnar deviation    Wrist radial deviation    Wrist pronation    Wrist supination    (Blank rows = not tested)  UPPER EXTREMITY MMT:   see below for re-eval 02/18/23  MMT Right 02/18/23 Left 02/18/23  Shoulder flexion 3+/5 3+/5  Shoulder abduction    Shoulder adduction    Shoulder extension    Shoulder internal rotation    Shoulder external rotation    Middle trapezius    Lower trapezius    Elbow flexion 4/5 4/5  Elbow extension 4-/5 4-/5  Wrist flexion    Wrist extension    Wrist ulnar deviation    Wrist radial deviation    Wrist pronation    Wrist supination      HAND FUNCTION: Grip strength: Right: 32 lbs; Left: 18 lbs, 02/18/23 R 55 lbs, L 35 lbs 05/13/23- grip RUE 42, LUE 34 09/23/23- RUE 35 lbs, LUE 38 lbs COORDINATION: 9 Hole Peg test:initial eval  Right: 1 min 48 sec; Left: 4 pegs in 2 mins  Box and Blocks:  Right 35 blocks, Left  26 blocks 02/18/23- RUE 35.46, LUE 65 secs 09/23/23- RUE 35.64 secs, LUE 60.92 secs  SENSATION: Light touch: WFL Hot/Cold: Not tested    COGNITION: Overall cognitive status:NT   VISION: Subjective report: reports hx of glaucoma VISION ASSESSMENT: Not tested  OBSERVATIONS: Pt is eager to improve, pt's husband is very supportive   TODAY'S TREATMENT:                                                                                                                              DATE:09/22/22- Therapist checked progress towards for renewal. Pt returns to occupational therapy following a break. See goals for progress.  Pt stood for 1 min 30 secs to perform functional reaching to place graded clothespins on a vertical target with right and left UE's. following a rest  break pt stood to fold laundry, for 1 min 20 secs prior to requiring reest break due to LE pain Fine motor coordination task to place and remove grooved pegs from pegboard with LUE, min-mod difficulty ad increased time required. Pt was issued new  red putty for sustained grip and pinch, exercises reviewed with bilateral UE's.   08/26/23- Pt returns to therapy after approx 1 month. she reports illness and weather kept her from attending. Therapist checked progress towards goals - see goals. Arm bike x 6 mins level 1 for conditioning. Standing at countertop 2 trials 1 min 50 secs and then 44 secs with supervision.  Functional reaching in standing to place graded clothespins on targets, close supervision.  Red theraband exercises for  biceps curls and triceps extension 10-15 reps each min v.c  07/24/23- arm bike x 6 mins level 1 for conditioning, with stockinette over right hand US  , 0.8 w/cm 2, 20% to ulnar side of palm and wrist  for pain and inflammation, no adverse reactions. Ice pack applied to R hand x 8 mins while pt copied small peg design with LUE for increased fine motor coordination and functional use, min difficulty/drops. Pt reported no pain at the end of session. Pt was instructed to try icing her hand at home, with ice pack wrapped in a towel for 5-8 mins several times per day.  07/22/23-Pt returns to therapy after several weeks, she has been having difficulty with receiving her IVIG treatment, has had illness and has had increased pain in RUE and difficulty weightbearing through her right hand. Therapist checked progress towards goals. Discussion with pt regarding plans to renew however pt will need to be able to demonstrate progress to continue beyond new renewal period. Paraffin wax to RUE x 10 mins while pt performed fine motor coordination task LUE. No adverse reactions. Grooved pegs for LUE fine motor coordination, min difficulty, then removing with in hand manipulation, min difficulty. Standing to place graded clothespins on target with LUE 1-8 # for sustained pinch close supervision. Pt was instructed to stop using putty for RUE and to reduce her weightbearing though RUE to minimize pain. Therapist recommends pt  sees MD regarding pain.   07/01/23- Arm bike x 6 mins level 1  Pt ambulated with supervision from arm bike to OT therapy room approximately 15ft with RW for endurance for ADLs Pt reported SOB after amb. HR 107, O2 sats 100% Pt rested for 5 mins and HR=95% Placing grooved pegs into pegboard with LUE for increased fine motor coordination, increased time and min-mod difficulty Gripper set at level 2 for sustained grip to pick up 1 inch blocks, with right and left UE's min difficulty/ drops.  06/26/23-Arm bike x 6 mins level 1 for conditioning Pt ambulated w/c to arm bike 10' then pt later ambulated down the hall approximately 50 ft  Pt copied small peg design for small peg design with left and right UE's for increased fine motor coordination, mod difficulty with LUE, min difficulty with right. Grip R 40 lbs, L 30 lbs Graded clothespins  1-8# for sustained pinch for LUE mod difficulty, RUE min difficulty.  Pt was able to ambulate back to main gym with supervision approximately 40 ft  to first mat from OT office with RW and supervision   PATIENT EDUCATION: Education details: red putty HEP, progress towards goals Person educated: Patient Education method: explanation, demonstration, min v.c  Education comprehension: verbalized understanding, returned demonstration.  HOME EXERCISE PROGRAM:  12/17/22- coordination HEP,  cane exercises seated   GOALS: Goals reviewed with patient? Yes  SHORT TERM GOALS: Target date:   I with initial HEP Baseline: Goal status: met 01/21/23  2.  Pt will demonstrate improved UE functional use as evidenced increasing bilateral box/ blocks score by 4 blocks.  Baseline: R 35, L 26 Goal status  met 12/31/22 RUE :43:  LUE 35    3.  Pt will donn pants with mod A. Baseline: max A Goal status:met  02/18/23- pt performed mod I today  4. Pt will verbalize understanding of adapted strategies to maximize safety and I with ADLs/ IADLs .  Goal status:  met, pt has been  using her tub bench  5.  Pt will stand for 5 mins prior to rest break in prep for ADLS. Revised goal: Pt will stand x 4 mins prior to rest break in prep for ADLS.  Goal status: not met,  pt. stood 1 min 30 secs and 1 min 20 secs.   6.  Pt will perform w/c to [facility] transfers mod I-  mod I per pt report 05/08/23  Goal status:  met, 02/18/23 7. Pt will increase bilateral grip strength by 5 lbs for increased functional use of UE's  Revised goal: Pt will demonstrate 33 lbs grip strength for RUE for increased functional use.    Goal status:  met RUE 43, LUE 35 lbs  8. Pt will demonstrate 31 lbs grip strength for LUE for increased functional use.  Baseline: 07/22/23 LUE 28 lbs  Goal status:met 35 lbs 08/26/23   9  Pt will stand for 2 mins  x 2 trials prior to rest break in prep for ADLS.      Baseline: 1 min 30 secs on 09/23/23     Goal status: new  10. I with updated HEP  Goal status: new  11. Pt will report increased ease with opening contianers with UE's.  Goal status: new  LONG TERM GOALS: Target date:  I with updated HEP  Goal status: met, 07/22/23  2.  Pt will perform LB dressing mod I. Baseline:max A Goal status:met 03/25/23- 8/6- patient indicates she is dressing herself.   3.  Pt will bathe with supervision/ set up. Baseline: min-mod A for sponge bath Goal status: duplicate goal, therefore deferred,  see below, pt met for sponge bathing.  4. : Pt will perform basic cooking modified independently at a walker level. Revised goal: Pt will perform microwave cooking and sandwich prep at a walker level mod I Goal status:  ongoing -Pt has perfomed sandwich prep at w/c level 09/23/23  5.  Pt will demonstrate improved fine motor coordination as evidenced by performing 9 hole peg test in 55 secs or less ( for LUE)  Revised goal: Pt will demonstrate improved fine motor coordination as evidenced by performing LUE 9 hole peg test in 68 secs or less Goal status: met, 61 secs  6.  Pt will  demonstrate improved fine motor coordination as evidenced by performing RUE 9 hole peg test in 1 mins  30 secs or less.  Baseline: RUE  1 min 48 secs, LUE 4 pegs placed in 2 mins  Goal status: met 02/18/23 RUE 35.46,     7. Pt will perform shower with supervision using tub transfer bench Goal status: met,pt performs mod I    8. Pt will perform basic home management at a walker level mod I    Revised goal: Pt will consistently wipe countertops off in standing  and she will stand to fold laundry mod I.    Goal status: ongoing, 09/23/23 not consistently perfroming in standing      9. Pt will report RUE pain no greater than 4/10 for functional activity.                             Baseline: 6-8/10        Goal status:met, no pain today 08/26/23   10. Pt will demonstrate improved LUE fine motor coordination for ADLs  as evidenced by decreaing 9 hole peg test score to 58 secs or less.    Baseline: 09/23/23- LUE 9 hole peg test 60.92 secs    Goals status: new CLINICAL IMPRESSION: Pt returns to occupaitonal therapy after a several week break. She continues to progress slowly towards goals. See goals for progress and updates. She can benefit from continued skilled occupational therapy to address the following deficits: decreased strength, decreased coordination, decreased functional mobility, decreased activity tolerance, decreased standing tolerance, pain hich impedes performance of ADLs/IADLs. Pt can benefit from skilled occupational therapy to address the following deficits incorder to maximize pt's safety and I with daily activities. PERFORMANCE DEFICITS: in functional skills including ADLs, IADLs, coordination, dexterity, sensation, ROM, strength, flexibility, Fine motor control, Gross motor control, mobility, balance, endurance, decreased knowledge of precautions, decreased knowledge of use of DME, and UE functional use, cognitive skills including  and psychosocial skills including coping strategies,  environmental adaptation, habits, interpersonal interactions, and routines and behaviors.   IMPAIRMENTS: are limiting patient from ADLs, IADLs, rest and sleep, play, leisure, and social participation.   CO-MORBIDITIES: may have co-morbidities  that affects occupational performance. Patient will benefit from skilled OT to address above impairments and improve overall function.  MODIFICATION OR ASSISTANCE TO COMPLETE EVALUATION: No modification of tasks or assist necessary to complete an evaluation.  OT OCCUPATIONAL PROFILE AND HISTORY: Detailed assessment: Review of records and additional review of physical, cognitive, psychosocial history related to current functional performance.  CLINICAL DECISION MAKING: LOW - limited treatment options, no task modification necessary  REHAB POTENTIAL: Good  EVALUATION COMPLEXITY: Low    PLAN:  OT FREQUENCY:1-2x week  OT DURATION: 9 weeks  PLANNED INTERVENTIONS: self care/ADL training, therapeutic exercise, therapeutic activity, neuromuscular re-education, manual therapy, passive range of motion, gait training, balance training, stair training, functional mobility training, aquatic therapy, ultrasound, paraffin, moist heat, cryotherapy, patient/family education, energy conservation, coping strategies training, DME and/or AE instructions, and Re-evaluation  RECOMMENDED OTHER SERVICES: PT  CONSULTED AND AGREED WITH PLAN OF CARE: Patient and family member/caregiver  PLAN FOR NEXT SESSION: work towards updated goals, standing tolerance/ balance for ADLs.  Tamieka Rancourt, OT 09/23/2023, 3:32 PM

## 2023-09-23 ENCOUNTER — Ambulatory Visit: Payer: 59 | Admitting: Physical Therapy

## 2023-09-23 ENCOUNTER — Encounter: Payer: Self-pay | Admitting: Occupational Therapy

## 2023-09-23 ENCOUNTER — Encounter: Payer: Self-pay | Admitting: Physical Therapy

## 2023-09-23 ENCOUNTER — Telehealth: Payer: Self-pay | Admitting: Neurology

## 2023-09-23 ENCOUNTER — Ambulatory Visit: Payer: 59 | Attending: Family Medicine | Admitting: Occupational Therapy

## 2023-09-23 DIAGNOSIS — G6181 Chronic inflammatory demyelinating polyneuritis: Secondary | ICD-10-CM

## 2023-09-23 DIAGNOSIS — R2689 Other abnormalities of gait and mobility: Secondary | ICD-10-CM

## 2023-09-23 DIAGNOSIS — R278 Other lack of coordination: Secondary | ICD-10-CM | POA: Diagnosis present

## 2023-09-23 DIAGNOSIS — R2681 Unsteadiness on feet: Secondary | ICD-10-CM | POA: Diagnosis present

## 2023-09-23 DIAGNOSIS — M6281 Muscle weakness (generalized): Secondary | ICD-10-CM | POA: Insufficient documentation

## 2023-09-23 NOTE — Therapy (Signed)
 OUTPATIENT PHYSICAL THERAPY LOWER EXTREMITY TREATMENT   Patient Name: Dana [name] MRN: [id number] DOB:05-10-1966, 58 y.o., female Today's Date: 09/23/2023  END OF SESSION:  PT End of Session - 09/23/23 0936     Visit Number 29    Date for PT Re-Evaluation 10/21/23    PT Start Time 0930    PT Stop Time 1010    PT Time Calculation (min) 40 min    Equipment Utilized During Treatment Gait belt    Activity Tolerance Patient tolerated treatment well    Behavior During Therapy WFL for tasks assessed/performed             Past Medical History:  Diagnosis Date   Anxiety    Back pain with radiation    Breast mass, right    Chronic pain    Chronic, continuous use of opioids    Complication of anesthesia 04/07/14   Allergic reaction to Lisinopril  immediately following surgery   DJD (degenerative joint disease)    Fibromyalgia    Groin abscess    Headache(784.0)    History of IBS    Hypercholesterolemia    IBS (irritable bowel syndrome)    Lactose intolerance    Mild hypertension    Obesity    Tobacco use disorder    Umbilical hernia    Symptomatic   Past Surgical History:  Procedure Laterality Date   ABDOMINAL HYSTERECTOMY     ANTERIOR CERVICAL DECOMP/DISCECTOMY FUSION N/A 12/20/2015   Procedure: ANTERIOR CERVICAL DECOMPRESSION FUSION CERVICAL 4-5, CERVICAL 5-6, CERVICAL 6-7 WITH INSTRUMENTATION AND ALLOGRAFT;  Surgeon: Oneil Priestly, MD;  Location: [facility] OR;  Service: Orthopedics;  Laterality: N/A;  Anterior cervical decompression fusion, cervical 4-5, cervical 5-6, cervical 6-7 with instrumentation and allograft   BACK SURGERY     BILATERAL SALPINGECTOMY  09/03/2012   Procedure: BILATERAL SALPINGECTOMY;  Surgeon: Curlee VEAR Guan, MD;  Location: [facility] ORS;  Service: Gynecology;  Laterality: Bilateral;   BREAST BIOPSY Right 04/07/2014   Procedure: REMOVAL RIGHT BREAST MASS WITH WIRE LOCALIZATION;  Surgeon: Krystal JINNY Russell, MD;  Location: [facility] OR;  Service: General;   Laterality: Right;   BREAST EXCISIONAL BIOPSY Left    x2   BREAST LUMPECTOMY     x2   CARPAL TUNNEL RELEASE Left 05/11/2019   Procedure: LEFT CARPAL TUNNEL RELEASE, RIGHT TENNIS ELBOW MARCAINE /DEPO MEDROL  INJECTION UNDER ANESTHESIA;  Surgeon: Lucilla Lynwood BRAVO, MD;  Location: [facility] OR;  Service: Orthopedics;  Laterality: Left;   COLONOSCOPY W/ BIOPSIES  04/24/2012   per Dr. Avram, clear, repeat in 10 yrs    disectomy     ESOPHAGOGASTRODUODENOSCOPY     FINGER SURGERY     Right index-excision of mass    FOOT SURGERY Right    Bone Spurs   IR FLUORO GUIDE CV LINE RIGHT  01/29/2023   IR REMOVAL TUN CV CATH W/O FL  02/08/2023   IR US  GUIDE VASC ACCESS RIGHT  01/31/2023   LAPAROSCOPIC HYSTERECTOMY  09/03/2012   Procedure: HYSTERECTOMY TOTAL LAPAROSCOPIC;  Surgeon: Curlee VEAR Guan, MD;  Location: [facility];  Service: Gynecology;  Laterality: N/A;   LUMBAR DISC SURGERY     TUBAL LIGATION     UMBILICAL HERNIA REPAIR N/A 05/01/2018   Procedure: UMBILICAL HERNIA REPAIR;  Surgeon: Kimble Lynwood, MD;  Location: [facility] OR;  Service: General;  Laterality: N/A;   Patient Active Problem List   Diagnosis Date Noted   Hyperkeratosis of sole 08/01/2023   CIDP (chronic inflammatory demyelinating polyneuropathy) (HCC) 01/28/2023  Gait abnormality 01/28/2023   Glaucoma 01/28/2023   Pain due to onychomycosis of toenails of both feet 01/21/2023   Wheelchair dependence 12/30/2022   Nerve pain 12/30/2022   Insomnia due to medical condition 12/30/2022   Acute inflammatory demyelinating polyneuropathy (HCC) 10/01/2022   UTI (urinary tract infection) 09/24/2022   Hypomagnesemia 09/24/2022   Weakness 09/23/2022   Hypokalemia 09/23/2022   B12 deficiency 09/05/2022   Folate deficiency 09/05/2022   Carpal tunnel syndrome, left upper limb 05/11/2019    Class: Chronic   Spinal stenosis of lumbar region with neurogenic claudication 08/20/2018   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   Primary osteoarthritis of right  knee 02/27/2017   Right tennis elbow 11/11/2016   Muscle cramps 01/15/2016   Bilateral leg edema 01/08/2016   Radiculopathy 12/20/2015   Hyperglycemia 12/21/2014   Mastodynia, female 11/30/2014   S/P excision of fibroadenoma of breast 11/30/2014   Angioedema of lips 04/07/2014   Fibroadenoma of right breast 03/07/2014   Pelvic pain in female 06/19/2012   Menorrhagia 06/11/2012   SUI (stress urinary incontinence, female) 06/11/2012   HTN (hypertension) 06/11/2012   IBS (irritable bowel syndrome) - diarrhea predominant 03/17/2012   MICROSCOPIC HEMATURIA 11/30/2010   BREAST PAIN, RIGHT 11/30/2010   BREAST MASS, RIGHT 01/12/2010   HYPERCHOLESTEROLEMIA 12/07/2007   CIGARETTE SMOKER 12/07/2007   Osteoarthritis 12/07/2007   Low back pain with sciatica 12/07/2007   Headache 12/07/2007   Obesity, Class III, BMI 40-49.9 (morbid obesity) (HCC) 08/03/2007   Anxiety state 08/03/2007    PCP: Johnny Garnette LABOR, MD  REFERRING PROVIDER: Johnny Garnette LABOR, MD  REFERRING DIAG: G61.0 (ICD-10-CM) - Guillain Barr syndrome [facility])   THERAPY DIAG:  Muscle weakness (generalized)  Other lack of coordination  Unsteadiness on feet  Other abnormalities of gait and mobility  CIDP (chronic inflammatory demyelinating polyneuropathy) (HCC)  Rationale for Evaluation and Treatment: Rehabilitation  ONSET DATE: 12/05/22  SUBJECTIVE:   SUBJECTIVE STATEMENT: Patient reports that she was supposed to have her knee injections yesterday, but was delayed until Friday due to the weather. Her knees are sore.  OBJECTIVE STATEMENT: 01/28/23 ASSESSMENT AND PLAN   Dana [name] is a 58 y.o. female   Demyelinating polyradiculoneuropathy             Symptom onset monophasic since September 2024, failed to respond to IVIG in January 2024,             EMG nerve conduction study showed demyelinating features, no evidence of axonal loss,             Talk with neurohospitalist Dr. Jerrie, ED triage, will send her for  hospital admission for plasma exchange, please add on following labs, immunofixative protein electrophoresis, ANA with reflex, SSA, SSB titer, iron panel             Starting outpatient IVIG prior authorization process,  Dana [name] is a 58 y.o. female who presented to the [facility] ED on 09/23/2022 with bilateral lower extremity weakness with decreased mobility that has progressed to both arms. She also reported numbness and tingling of the extremities. She was transferred to [facility] for MRI. 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  Inpatient Rehab F/B HHPT.  R knee injection 10/08/22 trigger point injections for worsening back pain 2/15  RA  PAIN:  Are you having pain? Yes: NPRS scale: up to 10/10 Pain location: all over, but her R knee pain is worst,  Pain description: Sharp Aggravating factors: Has difficulty standing  up Relieving factors: Medicine helps somewhat  PRECAUTIONS: Fall  WEIGHT BEARING RESTRICTIONS: No  FALLS:  Has patient fallen in last 6 months? No  LIVING ENVIRONMENT: Lives with: lives with their family Lives in: House/apartment Stairs: Nohas a ramp Has following equipment at home: Environmental Consultant - 2 wheeled, Wheelchair (power), Tour manager, bed side commode, Ramped entry, and [employer] lift  OCCUPATION: on disability due to RA and a fall several years ago  PLOF: Independent  PATIENT GOALS: walk  NEXT MD VISIT: She is scheduled to follow up with Dr. Duwaine Furnish with Physical Med/Rehab on 12-30-22. She is scheduled to follow up with Dr. Modena Callander at Neurology on 01-28-23.   OBJECTIVE:   DIAGNOSTIC FINDINGS:  Right knee radiographs 11/09/2020   FINDINGS: Severe patellofemoral joint space narrowing. Severe superior and mild inferior patellar degenerative osteophytosis. Moderate superior trochlear degenerative osteophytosis. No joint effusion. Severe medial compartment joint space narrowing with moderate peripheral medial and lateral compartment  degenerative osteophytes.  COGNITION: Overall cognitive status: Within functional limits for tasks assessed     SENSATION: Light touch: Impaired  and B hands and feet and lower legs  EDEMA:  BLE edema noted.  MUSCLE LENGTH: Hamstrings: WFL Thomas test: B tightness, to neutral  POSTURE: rounded shoulders and flexed trunk   PALPATION: No TTP  LOWER EXTREMITY ROM: BLE ROM limited in all planes an all joints due to body habitus   LOWER EXTREMITY MMT:  MMT Right eval Left eval R 05/01/23 L 05/01/23 R/L 06/05/23  Hip flexion 3 3- 4- 3+ 4/4  Hip extension 3- 3- 3 3 3   Hip abduction 3 3- 3+ 3+ 4-/3+  Hip adduction       Hip internal rotation       Hip external rotation       Knee flexion 4 4- 4 4 4+/4+  Knee extension 4- 4- 4 4 4+/4+  Ankle dorsiflexion 4- 4- 4 4 4/4  Ankle plantarflexion       Ankle inversion 4- 4-     Ankle eversion 4- 4-      (Blank rows = not tested)  FUNCTIONAL TESTS:  5 times sit to stand: unable to complete Timed up and go (TUG): unable to complete,  04/22/23  TUG 63 seconds with FWW  Bed Mobility: Occasional min A for sup <> sit, rolled B on mat with great effort, light min A  TRANSFERS: Scooting transfer with CGA, stand pivot with CGA, she reports that she sometimes has difficulty rising due to her knee pain  GAIT: Distance walked: 0' Assistive device utilized:  shopping cart-will bring her RW from home. Level of assistance: Min A Comments: Stood and weight shifted, then stood again and took steps to turn to W/C.  TODAY'S TREATMENT:                                                                                                                              DATE:  09/23/23 NuStep L5 x 6 minutes B repeated rolling on mat with no U or LE support to engage trunk Sup to and from long sit x 2 Seated hip and HS stretch Quadruped, alternating arm reach x 4 each, hurt knees so further quadruped deferred Side lying hip abd x 10 each side Standing  march with BUE support on RW, instructed to minimize use of UE. 2 x 6 reps. Supine bridge over physioball x 5 reps  08/26/23 NuStep L5 x 2 minutes- had to stop due to abdominal cramping. Mini squats at mat x 5 Standing side to side steps, increasing speed as she progressed x 5 Standing step back and return to each side. X 5 Heel raises x 10  07/23/24 NuStep L5 x 6 minutes Seated rotational reach to the side, 2 x 5 each way. Mini squats while holding 4# ball, 2 x 5 reps B side step with weight shift and return to center, 2 x 6 reps with RW and CGA. Seated long kicks with 3# on each leg 2 x 10 reps each Stand pivot transfers x 4 with RW and S.  07/22/23 NuStep L5 x 6 minutes Goals re-assessed for Usaa, 2 x 10 with 2 blue bands, each leg. Alternating step taps on 4 step with BUE support on RW, but trying to minimize UE support, CGA Standing heel raises x 10, B. Standing step back and around, reach back, then return to center. X 8 each direction Walk x 5' to W/C using RW and CGA.  07/01/23 NuStep L5 x 6 minutes- attempted bike, but she reported severe calf pain. Activity stress test performed due to patient's reports of severe SOB and racing heart and overheating with gait activities.  NuStep, progressive WATTS. HR, SATS, RPE assessed each 2 minutes. Rest- SATS/HR/RPE 100/91 2 min at 25-30 W 98/10/3 2 min at 35-40 W 98/111/5 2 min at 35-40 W 97/114/10 stopped Ambulation x 25' with RW SATS/HR 100/117  06/26/23 NuStep L4 x 6 minutes Alternately tapping 6 step with BUE support on RW, attempting to decrease reliance on UE, 15 each leg, more challenging standing on R. Standing B weight shifts over BOS, then quick step taps to each side. Performed balance training- Step forward with RLE, reach to pick up cone, then step back and turn R leg out, with weight shift back onto the R leg, reaching to place on mat. Repeated x 6 each direction, no UE support. B side stepping on  airex pad in parallel bars x 1, challenging.  PATIENT EDUCATION:  Education details: POC Person educated: Patient Education method: Explanation Education comprehension: verbalized understanding  HOME EXERCISE PROGRAM: [id number]  ASSESSMENT:  CLINICAL IMPRESSION: Patient reports that she is using her RW at home daily. She was scheduled for B knee injections yesterday, but had to postpone to Friday due to the weather, so her knees were achy. She participated in an intense treatment, focused on trunk strength and stability, demonstrating much improved control in rolling and moving on the mat.  OBJECTIVE IMPAIRMENTS: Abnormal gait, decreased activity tolerance, decreased balance, decreased coordination, decreased endurance, difficulty walking, decreased ROM, decreased strength, and postural dysfunction.   ACTIVITY LIMITATIONS: carrying, lifting, bending, standing, squatting, stairs, transfers, and locomotion level  PARTICIPATION LIMITATIONS: meal prep, cleaning, laundry, driving, and shopping  PERSONAL FACTORS: Fitness, Past/current experiences, and 1 comorbidity: Recently diagnosed with GBS  are also affecting patient's functional outcome.   REHAB POTENTIAL: Good  CLINICAL DECISION MAKING: Evolving/moderate complexity  EVALUATION COMPLEXITY: Moderate  GOALS: Goals reviewed with patient? Yes  SHORT TERM GOALS: Target date: 11/28/22 I with initial HEP Baseline: Goal status: met 01/09/23  LONG TERM GOALS: Target date: 03/06/23  I with final HEP Baseline:  Goal status: 06/05/23, ongoing  2.  Decrease 5x STS to < 14 sec Baseline: unable to complete Goal status: 08/26/23-22, ongoing  3.  Decrease TUG to < 20 sec Baseline: Unable to complete. Goal status: 02/18/23 1:55 04/01/23 = 65 seconds, 08/26/23-34 sec, ongoing  4.  Patient will ambulate at least 300' with LRAD, MI, on level and unlevel surfaces. Baseline: Stand pivot transfer with CGA Goal status: 02/18/23-20', RW, CGA, slow,  but stable throughout. 06/05/23-100', RW, ongoing, 09/23/23- 100', limited by knee pain today. Ongoing  5.  Perform all bed mobility with I Baseline: Occ min A on mat Goal status: 02/18/23-met  6.  Increase BLE strength to at least 4/5 throughout. Baseline: (3-)-3/5 Goal status: 06/05/23-See MMT, ongoing. 07/22/23-4-/5, ongoing  7. Patient and husband will safely perform car transfers with LRAD Baseline: Currently uses van transportation Goal Status: 02/25/23 met  PLAN:  PT FREQUENCY: 1-2x/week  PT DURATION: 12 weeks  PLANNED INTERVENTIONS: Therapeutic exercises, Therapeutic activity, Neuromuscular re-education, Balance training, Gait training, Patient/Family education, Self Care, Joint mobilization, Stair training, Dry Needling, Electrical stimulation, Cryotherapy, Moist heat, Ultrasound, Ionotophoresis 4mg /ml Dexamethasone , and Manual therapy  PLAN FOR NEXT SESSION: re-assess her status after 4 week HEP program.   Devere Mean DPT 09/23/23 11:08 AM

## 2023-09-23 NOTE — Telephone Encounter (Signed)
 Pt confirming appt details

## 2023-09-26 ENCOUNTER — Ambulatory Visit: Payer: 59 | Admitting: Surgical

## 2023-09-30 ENCOUNTER — Ambulatory Visit: Payer: 59 | Admitting: Physical Therapy

## 2023-09-30 ENCOUNTER — Ambulatory Visit: Payer: 59 | Admitting: Occupational Therapy

## 2023-10-04 ENCOUNTER — Other Ambulatory Visit: Payer: Self-pay | Admitting: Physical Medicine and Rehabilitation

## 2023-10-04 ENCOUNTER — Other Ambulatory Visit: Payer: Self-pay | Admitting: Family Medicine

## 2023-10-07 ENCOUNTER — Ambulatory Visit: Payer: 59 | Admitting: Occupational Therapy

## 2023-10-07 ENCOUNTER — Ambulatory Visit: Payer: 59 | Admitting: Physical Therapy

## 2023-10-08 ENCOUNTER — Ambulatory Visit: Payer: 59 | Admitting: Surgical

## 2023-10-10 ENCOUNTER — Telehealth: Payer: Self-pay | Admitting: Family Medicine

## 2023-10-10 NOTE — Telephone Encounter (Signed)
Copied from CRM [id number]. Topic: General - Other >> Oct 10, 2023  2:21 PM Adele Barthel wrote: Reason for CRM: Patient has been advised by [facility] that she requires a new order for a mattress for her hospital bed at home. CB# [phone]

## 2023-10-13 ENCOUNTER — Other Ambulatory Visit: Payer: Self-pay | Admitting: Family Medicine

## 2023-10-13 DIAGNOSIS — M48062 Spinal stenosis, lumbar region with neurogenic claudication: Secondary | ICD-10-CM

## 2023-10-13 DIAGNOSIS — G6181 Chronic inflammatory demyelinating polyneuritis: Secondary | ICD-10-CM

## 2023-10-13 NOTE — Telephone Encounter (Unsigned)
Copied from CRM [id number]. Topic: Clinical - Medication Refill >> Oct 13, 2023  9:19 AM Louie Boston wrote: Most Recent Primary Care Visit:  Provider: Gershon Crane A  Department: [facility]  Visit Type: MYCHART VIDEO VISIT  Date: 08/29/2023  Medication: ALPRAZolam Prudy Feeler) 0.5 MG tablet  Has the patient contacted their pharmacy? Yes (Agent: If no, request that the patient contact the pharmacy for the refill. If patient does not wish to contact the pharmacy document the reason why and proceed with request.) (Agent: If yes, when and what did the pharmacy advise?)  Patient called pharmacy and was advised there are no refills available.   Is this the correct pharmacy for this prescription? Yes If no, delete pharmacy and type the correct one.  This is the patient's preferred pharmacy:  [facility] [address], [place] - [address] [place] [address] Phone: [phone] Fax: [phone]   Has the prescription been filled recently? No  Is the patient out of the medication? Yes  Has the patient been seen for an appointment in the last year OR does the patient have an upcoming appointment? Yes  Can we respond through MyChart? Yes  Agent: Please be advised that Rx refills may take up to 3 business days. We ask that you follow-up with your pharmacy.

## 2023-10-14 ENCOUNTER — Ambulatory Visit: Payer: 59 | Admitting: Physical Therapy

## 2023-10-14 ENCOUNTER — Ambulatory Visit: Payer: 59 | Admitting: Occupational Therapy

## 2023-10-14 MED ORDER — ALPRAZOLAM 0.5 MG PO TABS: 0.5000 mg | ORAL_TABLET | Freq: Two times a day (BID) | ORAL | 1 refills | Status: DC | PRN

## 2023-10-14 NOTE — Telephone Encounter (Signed)
Done

## 2023-10-16 NOTE — Telephone Encounter (Signed)
Order already faxed to [facility]

## 2023-10-22 ENCOUNTER — Ambulatory Visit: Payer: No Typology Code available for payment source | Admitting: Surgical

## 2023-11-03 ENCOUNTER — Ambulatory Visit (INDEPENDENT_AMBULATORY_CARE_PROVIDER_SITE_OTHER): Payer: No Typology Code available for payment source | Admitting: Neurology

## 2023-11-03 ENCOUNTER — Encounter: Payer: Self-pay | Admitting: Neurology

## 2023-11-03 ENCOUNTER — Telehealth: Payer: Self-pay | Admitting: Neurology

## 2023-11-03 VITALS — BP 141/73 | HR 91 | Ht 64.0 in | Wt 300.0 lb

## 2023-11-03 DIAGNOSIS — G610 Guillain-Barre syndrome: Secondary | ICD-10-CM | POA: Diagnosis not present

## 2023-11-03 DIAGNOSIS — R531 Weakness: Secondary | ICD-10-CM | POA: Diagnosis not present

## 2023-11-03 DIAGNOSIS — R269 Unspecified abnormalities of gait and mobility: Secondary | ICD-10-CM

## 2023-11-03 DIAGNOSIS — G6181 Chronic inflammatory demyelinating polyneuritis: Secondary | ICD-10-CM

## 2023-11-03 DIAGNOSIS — M792 Neuralgia and neuritis, unspecified: Secondary | ICD-10-CM

## 2023-11-03 MED ORDER — DULOXETINE HCL 60 MG PO CPEP: 60.0000 mg | ORAL_CAPSULE | Freq: Every day | ORAL | 11 refills | Status: DC

## 2023-11-03 NOTE — Progress Notes (Signed)
Chief Complaint  Patient presents with   Follow-up    Pt in 15, here with husband [name] Pt is here for follow up on CIDP. Pt states she is having swelling/water retention on her left side, states she takes fluid pill however it does not help.       ASSESSMENT AND PLAN  Dana [name] is a 58 y.o. female   Demyelinating polyradiculoneuropathy Gait abnormality  Symptom onset monophasic since September 2023,  mild respond to IVIG in January 2024, altered motor response to plasma exchange (May 16 to 24, 2024)  EMG nerve conduction study in May 2024 showed demyelinating features, no evidence of axonal loss, spinal fluid testing showed elevated total protein of 155   Is receiving home IVIG 1 g/kg every 3 weeks, did improve her weakness  She can ambulate with walker,  Her gait abnormality is certainly multifactorial, obesity, deconditioning, joints pain playing a major role,  Repeat EMG nerve conduction study  Continue physical therapy, encourage moderate exercise    DIAGNOSTIC DATA (LABS, IMAGING, TESTING) - I reviewed patient records, labs, notes, testing and imaging myself where available. CSF in Jan 2024, TP 155, Glucose 70,  RBC 17, 750  MEDICAL HISTORY:  Dana [name], is a 58 year old female, accompanied by her husband seen in request by her primary care from [facility] Dr. 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 PMHX Chronic insomnia HTN Neuropathic pain--gabapentin 900mg  tid B12 deficiency. Obesity Anxiety  Patient had long history of chronic joint pain, lumbar, cervical decompression surgery in the past, obesity, went on disability few years ago, poor functional status, but at her baseline around September 2023, she was able to ambulate with a cane, independent in daily activity, has a lot of knee pain.  Around October 2023, she  began to notice numbness tingling starting at the bottom of her feet, quickly ascending to involving both feet, lower extremity, by November 2023, she began to notice increased difficulty walking, difficulty getting into her bed, began to notice fingertip paresthesia, weakness of her hands,  In December 2023, she had such difficulty, could not put Her Christmas tree, by end of December, she began to have frequent GI symptoms, nausea vomiting, throwing up, numbness continue ascending to lower pelvic region, she cannot feel herself when wiping, difficulty initiate urine,  Presented to hospital on September 23, 2022, with worsening weakness, numbness, fell, could not get up  She was diagnosed with possible Guillain-Barr, based on abnormal spinal fluid testing, bloody tap, total protein was 155,  She was treated with IVIG 2 g/kg total divided into 5 days, January 10 to 15, 2024  She denies significant improvement in the setting of very poor baseline, profound weakness when receiving treatment, was discharged to inpatient rehabilitation, now back home, she is bound to electronic wheelchair, no longer ambulatory, needing help transfer, significant bilateral knee pain, continue have numbness tingling to lower pelvic region, also have hands numbness tingling, upper extremity weakness  Personally reviewed MRI all cervical spine, previous ACDF at C4-7 without residual spinal canal stenosis, multilevel degenerative changes, MRI of thoracic spine showed no significant abnormality,  MRI of the lumbar spine showed multilevel degenerative changes,moderate spinal stenosis at L3-4, epidural lipomatosis with resultant moderate to severe spinal stenosis at L4-5,   CT head showed no significant abnormality Extensive laboratory evaluation since December 2023, mild elevation of A1c 6.1, B12 was 150,  repeat level was 1069, negative HIV, hepatitis A, CMV, respiratory panel, TSH, folic acid, elevated ESR of 38, C-reactive  protein of 5.9, normal aldolase, copper, vitamin D slightly decreased 6.8, ant-GM1, GQ antibody,  Drug screen was positive for amrijuana  EMG nerve conduction study showed absent sensory responses of upper and lower extremities, prolonged F-wave latency at upper extremity motor response, no evidence of axonal loss  UPDATE March 18 2023: She was sent to hospital for PLEX May 16-24, 2024, she began to notice improvement after plasma exchange,  she continued to progress at home, at home, she can take few steps with walker, but far from baseline  Continue complains of bilateral lower extremity weakness left worse than right, numbness of her feet and hands, clumsy of hands  UPDATE Nov 03 2023: She came in wheelchair, accompanied by her husband at today's clinical visit, continued home IVIG 1 g/kg every 3 weeks, reported mild to moderate improvement, able to ambulate at her house with his walker, but not back to baseline level yet, slow weight gain, multiple joints pain,   PHYSICAL EXAM:   Vitals:   11/03/23 1005  BP: (!) 141/73  Pulse: 91  Weight: 300 lb (136.1 kg)  Height: 5\' 4"  (1.626 m)    Body mass index is 51.49 kg/m.  PHYSICAL EXAMNIATION:  Gen: NAD, conversant, well nourised, well groomed                     Cardiovascular: Regular rate rhythm, no peripheral edema, warm, nontender. Eyes: Conjunctivae clear without exudates or hemorrhage Neck: Supple, no carotid bruits. Pulmonary: Clear to auscultation bilaterally   NEUROLOGICAL EXAM:  MENTAL STATUS: Speech/cognition: Obese,in electronic wheelchair, awake, alert, oriented to history taking and casual conversation CRANIAL NERVES: CN II: Visual fields are full to confrontation. Pupils are round equal and briskly reactive to light. CN III, IV, VI: extraocular movement are normal. No ptosis. CN V: Facial sensation is intact to light touch CN VII: Face is symmetric with normal eye closure  CN VIII: Hearing is normal to causal  conversation. CN IX, X: Phonation is normal. CN XI: Head turning and shoulder shrug are intact  MOTOR: There was no significant bilateral upper extremity proximal and distal weakness  REFLEXES: Areflexia  SENSORY: Length-dependent decreased light touch, pinprick to distal shin level  COORDINATION: There is no trunk or limb dysmetria noted.  GAIT/STANCE: Deferred  REVIEW OF SYSTEMS:  Full 14 system review of systems performed and notable only for as above All other review of systems were negative.   ALLERGIES: Allergies  Allergen Reactions   Zestril [Lisinopril] Anaphylaxis   Codeine Hives   Cyanocobalamin [Vitamin B12] Itching    Patient able to take B12 injections with Benadryl.  Has no issues when taking B12 tablets.    Hydrocodone Hives   Penicillins Itching and Swelling   Morphine Sulfate Swelling   Norvasc [Amlodipine] Swelling   Oxycodone Itching   Tylenol [Acetaminophen] Rash    HOME MEDICATIONS: Current Outpatient Medications  Medication Sig Dispense Refill   ALPRAZolam (XANAX) 0.5 MG tablet Take 1 tablet (0.5 mg total) by mouth 2 (two) times daily as needed for anxiety. 180 tablet 1   ammonium lactate (LAC-HYDRIN) 12 % lotion Apply 1 Application topically in the morning and at bedtime. 225 g 5   cephALEXin (KEFLEX) 500 MG capsule Take 1 capsule (500 mg total) by mouth 4 (four) times daily. 40 capsule 0   Cyanocobalamin (VITAMIN B-12 PO) Take 1 tablet by  mouth daily.     cyclobenzaprine (FLEXERIL) 10 MG tablet TAKE 1 TABLET BY MOUTH THREE TIMES A DAY 90 tablet 5   dorzolamide-timolol (COSOPT) 2-0.5 % ophthalmic solution Place 1 drop into both eyes 2 (two) times daily. 10 mL 1   famotidine (PEPCID) 20 MG tablet Take 20 mg by mouth 2 (two) times daily.     fluticasone (FLONASE) 50 MCG/ACT nasal spray SPRAY 2 SPRAYS INTO EACH NOSTRIL EVERY DAY 16 mL 11   folic acid (FOLVITE) 1 MG tablet Take 1 tablet (1 mg total) by mouth daily. 90 tablet 3   furosemide (LASIX) 40  MG tablet Take 1 tablet (40 mg total) by mouth 2 (two) times daily. 180 tablet 3   gabapentin (NEURONTIN) 300 MG capsule Take 3 capsules (900 mg total) by mouth 3 (three) times daily. 270 capsule 5   hydrocerin (EUCERIN) CREA Apply 1 Application topically 2 (two) times daily.  0   HYDROmorphone (DILAUDID) 8 MG tablet Take 1 tablet (8 mg total) by mouth every 6 (six) hours as needed for severe pain (pain score 7-10). 120 tablet 0   HYDROmorphone (DILAUDID) 8 MG tablet Take 1 tablet (8 mg total) by mouth every 6 (six) hours as needed for severe pain (pain score 7-10). 120 tablet 0   HYDROmorphone (DILAUDID) 8 MG tablet Take 1 tablet (8 mg total) by mouth every 6 (six) hours as needed for severe pain (pain score 7-10). 120 tablet 0   ibuprofen (ADVIL) 200 MG tablet Take 400 mg by mouth 2 (two) times daily as needed for headache or moderate pain.     latanoprost (XALATAN) 0.005 % ophthalmic solution Place 1 drop into both eyes at bedtime. 2.5 mL 1   magnesium oxide (MAG-OX) 400 MG tablet Take 1 tablet (400 mg total) by mouth at bedtime. (Patient taking differently: Take 200 mg by mouth at bedtime.) 90 tablet 3   metoprolol tartrate (LOPRESSOR) 50 MG tablet Take 1 tablet (50 mg total) by mouth 2 (two) times daily. 180 tablet 3   Multiple Vitamin (MULTIVITAMIN WITH MINERALS) TABS tablet Take 1 tablet by mouth daily. 30 tablet 0   pantoprazole (PROTONIX) 40 MG tablet TAKE 1 TABLET BY MOUTH EVERY DAY 90 tablet 3   potassium chloride (KLOR-CON) 10 MEQ tablet TAKE 1 TABLET BY MOUTH 2 TIMES DAILY. 180 tablet 2   promethazine (PHENERGAN) 25 MG tablet Take 25 mg by mouth every 6 (six) hours as needed for nausea or vomiting.     senna-docusate (SENOKOT-S) 8.6-50 MG tablet Take 1 tablet by mouth 2 (two) times daily. (Patient taking differently: Take 1 tablet by mouth 2 (two) times daily as needed for moderate constipation.)     traZODone (DESYREL) 50 MG tablet TAKE 1 TO 2 TABLETS BY MOUTH AT BEDTIME AS NEEDED FOR  SLEEP 180 tablet 1   No current facility-administered medications for this visit.    PAST MEDICAL HISTORY: Past Medical History:  Diagnosis Date   Anxiety    Back pain with radiation    Breast mass, right    Chronic pain    Chronic, continuous use of opioids    Complication of anesthesia 04/07/14   Allergic reaction to Lisinopril immediately following surgery   DJD (degenerative joint disease)    Fibromyalgia    Groin abscess    Headache(784.0)    History of IBS    Hypercholesterolemia    IBS (irritable bowel syndrome)    Lactose intolerance    Mild hypertension  Obesity    Tobacco use disorder    Umbilical hernia    Symptomatic    PAST SURGICAL HISTORY: Past Surgical History:  Procedure Laterality Date   ABDOMINAL HYSTERECTOMY     ANTERIOR CERVICAL DECOMP/DISCECTOMY FUSION N/A 12/20/2015   Procedure: ANTERIOR CERVICAL DECOMPRESSION FUSION CERVICAL 4-5, CERVICAL 5-6, CERVICAL 6-7 WITH INSTRUMENTATION AND ALLOGRAFT;  Surgeon: Estill Bamberg, MD;  Location: [facility] OR;  Service: Orthopedics;  Laterality: N/A;  Anterior cervical decompression fusion, cervical 4-5, cervical 5-6, cervical 6-7 with instrumentation and allograft   BACK SURGERY     BILATERAL SALPINGECTOMY  09/03/2012   Procedure: BILATERAL SALPINGECTOMY;  Surgeon: Ok Edwards, MD;  Location: [facility];  Service: Gynecology;  Laterality: Bilateral;   BREAST BIOPSY Right 04/07/2014   Procedure: REMOVAL RIGHT BREAST MASS WITH WIRE LOCALIZATION;  Surgeon: Adolph Pollack, MD;  Location: [facility];  Service: General;  Laterality: Right;   BREAST EXCISIONAL BIOPSY Left    x2   BREAST LUMPECTOMY     x2   CARPAL TUNNEL RELEASE Left 05/11/2019   Procedure: LEFT CARPAL TUNNEL RELEASE, RIGHT TENNIS ELBOW MARCAINE/DEPO MEDROL INJECTION UNDER ANESTHESIA;  Surgeon: Kerrin Champagne, MD;  Location: [facility] OR;  Service: Orthopedics;  Laterality: Left;   COLONOSCOPY W/ BIOPSIES  04/24/2012   per Dr. Leone Payor, clear, repeat in 10 yrs     disectomy     ESOPHAGOGASTRODUODENOSCOPY     FINGER SURGERY     Right index-excision of mass    FOOT SURGERY Right    Bone Spurs   IR FLUORO GUIDE CV LINE RIGHT  01/29/2023   IR REMOVAL TUN CV CATH W/O FL  02/08/2023   IR US GUIDE VASC ACCESS RIGHT  01/31/2023   LAPAROSCOPIC HYSTERECTOMY  09/03/2012   Procedure: HYSTERECTOMY TOTAL LAPAROSCOPIC;  Surgeon: Ok Edwards, MD;  Location: [facility];  Service: Gynecology;  Laterality: N/A;   LUMBAR DISC SURGERY     TUBAL LIGATION     UMBILICAL HERNIA REPAIR N/A 05/01/2018   Procedure: UMBILICAL HERNIA REPAIR;  Surgeon: Jimmye Norman, MD;  Location: [facility] OR;  Service: General;  Laterality: N/A;    FAMILY HISTORY: Family History  Problem Relation Age of Onset   Diabetes Mother    Diabetes Father    Breast cancer Maternal Aunt 46   Prostate cancer Paternal Grandfather     SOCIAL HISTORY: Social History   Socioeconomic History   Marital status: Married    Spouse name: Tanyiah Laurich   Number of children: 4   Years of education: Not on file   Highest education level: Not on file  Occupational History   Occupation: Psychologist, educational  Tobacco Use   Smoking status: Former    Types: Cigarettes   Smokeless tobacco: Never   Tobacco comments:    quit March 2017  Vaping Use   Vaping status: Never Used  Substance and Sexual Activity   Alcohol use: Not Currently   Drug use: Not Currently    Comment: chronic use of dilaudid   Sexual activity: Not Currently    Birth control/protection: Surgical  Other Topics Concern   Not on file  Social History Narrative   Married - lives with husband 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, fashion/clothing    4 grown children does not live with her - 1985, 1988, 2 (twins)Grandchildren - 4Updated 10/12/2013   Right handed   Caffeine-none   Social Drivers of Health   Financial Resource Strain: Low Risk  (05/06/2022)   Overall Financial Resource Strain (CARDIA)  Difficulty of Paying Living Expenses: Not hard at all   Food Insecurity: No Food Insecurity (01/28/2023)   Hunger Vital Sign    Worried About Running Out of Food in the Last Year: Never true    Ran Out of Food in the Last Year: Never true  Transportation Needs: No Transportation Needs (01/28/2023)   PRAPARE - Administrator, Civil Service (Medical): No    Lack of Transportation (Non-Medical): No  Physical Activity: Inactive (05/06/2022)   Exercise Vital Sign    Days of Exercise per Week: 0 days    Minutes of Exercise per Session: 0 min  Stress: No Stress Concern Present (05/06/2022)   [employer] of Occupational Health - Occupational Stress Questionnaire    Feeling of Stress : Not at all  Social Connections: Unknown (01/25/2022)   Received from [facility]   Social Network    Social Network: Not on file  Intimate Partner Violence: Not At Risk (01/28/2023)   Humiliation, Afraid, Rape, and Kick questionnaire    Fear of Current or Ex-Partner: No    Emotionally Abused: No    Physically Abused: No    Sexually Abused: No      Levert Feinstein, M.D. Ph.D.  [facility] [address] [place], [place] [address] Ph: [phone] Fax: [phone]  CC:  Nelwyn Salisbury, MD [address] [place],  [place] [address]  Nelwyn Salisbury, MD

## 2023-11-03 NOTE — Telephone Encounter (Signed)
Dr. Terrace Arabia ordered NCV/EMG for patient. Can I schedule with MD only for next available or work in before [employer]?

## 2023-11-03 NOTE — Telephone Encounter (Signed)
Called pt and scheduled for 2/27 @ 9:30am.

## 2023-11-04 ENCOUNTER — Telehealth: Payer: Self-pay | Admitting: Occupational Therapy

## 2023-11-04 ENCOUNTER — Ambulatory Visit: Payer: Self-pay | Admitting: Occupational Therapy

## 2023-11-04 DIAGNOSIS — R2689 Other abnormalities of gait and mobility: Secondary | ICD-10-CM

## 2023-11-04 DIAGNOSIS — M6281 Muscle weakness (generalized): Secondary | ICD-10-CM

## 2023-11-04 DIAGNOSIS — R2681 Unsteadiness on feet: Secondary | ICD-10-CM

## 2023-11-04 DIAGNOSIS — R278 Other lack of coordination: Secondary | ICD-10-CM

## 2023-11-04 NOTE — Discharge Summary (Deleted)
OCCUPATIONAL THERAPY DISCHARGE SUMMARY  Visit number :31 Current functional level related to goals / functional outcomes: Pt goals were not reassessed as pt did not return to therapy.   Remaining deficits: decreased strength, decreased coordination, decreased functional mobility, decreased balance, pain   Education / Equipment: Pt was previously educated in HEP. Pt demonstrates understanding of previous HEP's, however HEP was not fully updated as pt did not return to therapy.   Patient agrees to discharge. Patient goals were not met. Patient is being discharged due to maximized rehab potential.  Pt was not able to progress as she was only able to attend 1/4 visit in January. Therapist spoke with pt. and recommended a several month break from therapy while she works on her HEP, and increases her participation in home management and mobility at home. Pt verbalized understanding. Keene Breath, OTR/L 11:06 AM 11/04/23

## 2023-11-04 NOTE — Therapy (Deleted)
OCCUPATIONAL THERAPY DISCHARGE SUMMARY  Visit number :31 Current functional level related to goals / functional outcomes: Pt goals were not reassessed as pt did not return to therapy.   Remaining deficits: decreased strength, decreased coordination, decreased functional mobility, decreased balance, pain   Education / Equipment: Pt was previously educated in HEP. Pt demonstrates understanding of previous HEP's, however HEP was not fully updated as pt did not return to therapy.   Patient agrees to discharge. Patient goals were not met. Patient is being discharged due to maximized rehab potential.  Pt was not able to progress as she was only able to attend 1/4 visit in January. Therapist spoke with pt. and recommended a several month break from therapy while she works on her HEP, and increases her participation in home management and mobility at home. Pt verbalized understanding. Keene Breath, OTR/L  11:02 AM 11/04/23

## 2023-11-04 NOTE — Discharge Summary (Deleted)
OCCUPATIONAL THERAPY DISCHARGE SUMMARY  Visit number :31 Current functional level related to goals / functional outcomes: Pt goals were not reassessed as pt did not return to therapy.   Remaining deficits: decreased strength, decreased coordination, decreased functional mobility, decreased balance, pain   Education / Equipment: Pt was previously educated in HEP. Pt demonstrates understanding of previous HEP's, however HEP was not fully updated as pt did not return to therapy.   Patient agrees to discharge. Patient goals were not met. Patient is being discharged due to maximized rehab potential.  Pt was not able to progress as she was only able to attend 1/4 visit in January. Therapist spoke with pt. and recommended a several month break from therapy while she works on her HEP, and increases her participation in home management and mobility at home. Pt verbalized understanding. Keene Breath, OTR/L  11:02 AM 11/04/23

## 2023-11-04 NOTE — Therapy (Deleted)
[facility] at [facility] [address] [facility]. [place], [place], [address] Phone: [phone]   Fax:  [phone]  Patient Details  Name: TANNY HARNACK MRN: [id number] Date of Birth: 22-May-1966 Referring Provider:  No ref. provider found  Encounter Date: 11/04/2023 OCCUPATIONAL THERAPY DISCHARGE SUMMARY  Visit number :31 Current functional level related to goals / functional outcomes: Pt goals were not reassessed as pt did not return to therapy.   Remaining deficits: decreased strength, decreased coordination, decreased functional mobility, decreased balance, pain   Education / Equipment: Pt was previously educated in HEP. Pt demonstrates understanding of previous HEP's, however HEP was not fully updated as pt did not return to therapy.   Patient agrees to discharge. Patient goals were not met. Patient is being discharged due to maximized rehab potential.  Pt was not able to progress as she was only able to attend 1/4 visit in January. Therapist spoke with pt. and recommended a several month break from therapy while she works on her HEP, and increases her participation in home management and mobility at home. Pt verbalized understanding.     Nayanna Seaborn, OT 11/04/2023, 10:52 AM  [facility] at [facility] [address] [facility]. [place], [place], [address] Phone: [phone]   Fax:  [phone]

## 2023-11-04 NOTE — Telephone Encounter (Signed)
OCCUPATIONAL THERAPY DISCHARGE SUMMARY  Visit number :31 Current functional level related to goals / functional outcomes: Pt goals were not reassessed as pt did not return to therapy.   Remaining deficits: decreased strength, decreased coordination, decreased functional mobility, decreased balance, pain   Education / Equipment: Pt was previously educated in HEP. Pt demonstrates understanding of previous HEP's, however HEP was not fully updated as pt did not return to therapy.   Patient agrees to discharge. Patient goals were not met. Patient is being discharged due to maximized rehab potential.  Pt was not able to progress as she was only able to attend 1/4 visit in January. Therapist spoke with pt. and recommended a several month break from therapy while she works on her HEP, and increases her participation in home management and mobility at home. Pt verbalized understanding. Keene Breath, OTR/L  11:02 AM 11/04/23

## 2023-11-05 ENCOUNTER — Ambulatory Visit: Payer: No Typology Code available for payment source | Admitting: Surgical

## 2023-11-12 ENCOUNTER — Ambulatory Visit: Payer: No Typology Code available for payment source | Admitting: Podiatry

## 2023-11-13 ENCOUNTER — Encounter: Payer: Self-pay | Admitting: Physical Therapy

## 2023-11-13 ENCOUNTER — Ambulatory Visit: Payer: No Typology Code available for payment source | Admitting: Neurology

## 2023-11-13 ENCOUNTER — Other Ambulatory Visit: Payer: Self-pay | Admitting: Physical Medicine and Rehabilitation

## 2023-11-13 ENCOUNTER — Ambulatory Visit (INDEPENDENT_AMBULATORY_CARE_PROVIDER_SITE_OTHER): Payer: No Typology Code available for payment source | Admitting: Neurology

## 2023-11-13 DIAGNOSIS — R531 Weakness: Secondary | ICD-10-CM

## 2023-11-13 DIAGNOSIS — R269 Unspecified abnormalities of gait and mobility: Secondary | ICD-10-CM | POA: Diagnosis not present

## 2023-11-13 DIAGNOSIS — G610 Guillain-Barre syndrome: Secondary | ICD-10-CM

## 2023-11-13 DIAGNOSIS — G6181 Chronic inflammatory demyelinating polyneuritis: Secondary | ICD-10-CM

## 2023-11-13 NOTE — Progress Notes (Addendum)
 No chief complaint on file.     ASSESSMENT AND PLAN  Dana [name] is a 58 y.o. female   Chronic demyelinating polyradiculoneuropathy Gait abnormality  Symptom onset monophasic since September 2023,  mild respond to IVIG in January 2024, and moderate response to plasma exchange (May 16 to 24, 2024)  EMG nerve conduction study in May 2024 showed demyelinating features, no evidence of axonal loss,  spinal fluid testing showed elevated total protein of 155   Is receiving home IVIG 1 g/kg every 3 weeks, did improve her weakness  Repeat EMG nerve conduction study in February 2025 showed significant improvement, mainly involving sensory component, motor component has recovered within normal limit.  She can ambulate with walker,  Her gait abnormality is certainly multifactorial, obesity, deconditioning, joints pain playing a major role,   Continue physical therapy, encourage moderate exercise    DIAGNOSTIC DATA (LABS, IMAGING, TESTING) - I reviewed patient records, labs, notes, testing and imaging myself where available. CSF in Jan 2024, TP 155, Glucose 70,  RBC 17, 750  MEDICAL HISTORY:  Dana [name], is a 58 year old female, accompanied by her husband seen in request by her primary care from [facility] Dr. Johnny Senior to follow-up for hospital discharge with diagnosis of Guillain-Barr syndrome, initial evaluation was Jan 28, 2023  I reviewed and summarized the referring note. PMHX Chronic insomnia HTN Neuropathic pain--gabapentin  900mg  tid B12 deficiency. Obesity Anxiety  Patient had long history of chronic joint pain, lumbar, cervical decompression surgery in the past, obesity, went on disability few years ago, poor functional status, but at her baseline around September 2023, she was able to ambulate with a cane, independent in daily activity, has a lot of knee pain.  Around October 2023, she began to notice numbness tingling starting at the bottom of her feet, quickly  ascending to involving both feet, lower extremity, by November 2023, she began to notice increased difficulty walking, difficulty getting into her bed, began to notice fingertip paresthesia, weakness of her hands,  In December 2023, she had such difficulty, could not put Her Christmas tree, by end of December, she began to have frequent GI symptoms, nausea vomiting, throwing up, numbness continue ascending to lower pelvic region, she cannot feel herself when wiping, difficulty initiate urine,  Presented to hospital on September 23, 2022, with worsening weakness, numbness, fell, could not get up  She was diagnosed with possible Guillain-Barr, based on abnormal spinal fluid testing, bloody tap, total protein was 155,  She was treated with IVIG 2 g/kg total divided into 5 days, January 10 to 15, 2024  She denies significant improvement in the setting of very poor baseline, profound weakness when receiving treatment, was discharged to inpatient rehabilitation, now back home, she is bound to electronic wheelchair, no longer ambulatory, needing help transfer, significant bilateral knee pain, continue have numbness tingling to lower pelvic region, also have hands numbness tingling, upper extremity weakness  Personally reviewed MRI all cervical spine, previous ACDF at C4-7 without residual spinal canal stenosis, multilevel degenerative changes, MRI of thoracic spine showed no significant abnormality,  MRI of the lumbar spine showed multilevel degenerative changes,moderate spinal stenosis at L3-4, epidural lipomatosis with resultant moderate to severe spinal stenosis at L4-5,   CT head showed no significant abnormality Extensive laboratory evaluation since December 2023, mild elevation of A1c 6.1, B12 was 150, repeat level was 1069, negative HIV, hepatitis A, CMV, respiratory panel, TSH, folic acid , elevated ESR of 38, C-reactive protein of 5.9, normal aldolase, copper ,  vitamin D  slightly decreased 6.8,  ant-GM1, GQ antibody,  Drug screen was positive for amrijuana  EMG nerve conduction study showed absent sensory responses of upper and lower extremities, prolonged F-wave latency at upper extremity motor response, no evidence of axonal loss  UPDATE March 18 2023: She was sent to hospital for PLEX May 16-24, 2024, she began to notice improvement after plasma exchange,  she continued to progress at home, at home, she can take few steps with walker, but far from baseline  Continue complains of bilateral lower extremity weakness left worse than right, numbness of her feet and hands, clumsy of hands  UPDATE Nov 03 2023: She came in wheelchair, accompanied by her husband at today's clinical visit, continued home IVIG 1 g/kg every 3 weeks, reported mild to moderate improvement, able to ambulate at her house with his walker, but not back to baseline level yet, slow weight gain, multiple joints pain,  UPDATE Nov 13 2023: She continues to have bilateral upper and lower extremity paresthesia, gait abnormality, EMG nerve conduction study showed significant improvement compared to previous study in May 2024, there is improved lower extremity motor response.   PHYSICAL EXAM:      11/03/2023   10:05 AM 08/01/2023    2:41 PM 07/01/2023    9:53 AM  Vitals with BMI  Height 5' 4    Weight 300 lbs 308 lbs   BMI 51.47    Systolic 141 142   Diastolic 73 80   Pulse 91 75 95     PHYSICAL EXAMNIATION:  Gen: NAD, conversant, well nourised, well groomed                     Cardiovascular: Regular rate rhythm, no peripheral edema, warm, nontender. Eyes: Conjunctivae clear without exudates or hemorrhage Neck: Supple, no carotid bruits. Pulmonary: Clear to auscultation bilaterally   NEUROLOGICAL EXAM:  MENTAL STATUS: Speech/cognition: Obese,in electronic wheelchair, awake, alert, oriented to history taking and casual conversation CRANIAL NERVES: CN II: Visual fields are full to confrontation. Pupils  are round equal and briskly reactive to light. CN III, IV, VI: extraocular movement are normal. No ptosis. CN V: Facial sensation is intact to light touch CN VII: Face is symmetric with normal eye closure  CN VIII: Hearing is normal to causal conversation. CN IX, X: Phonation is normal. CN XI: Head turning and shoulder shrug are intact  MOTOR: There was no significant bilateral upper extremity proximal and distal weakness  REFLEXES: Areflexia  SENSORY: Length-dependent decreased light touch, pinprick to distal shin level  COORDINATION: There is no trunk or limb dysmetria noted.  GAIT/STANCE: Deferred  REVIEW OF SYSTEMS:  Full 14 system review of systems performed and notable only for as above All other review of systems were negative.   ALLERGIES: Allergies  Allergen Reactions   Zestril  [Lisinopril ] Anaphylaxis   Codeine Hives   Cyanocobalamin  [Vitamin B12] Itching    Patient able to take B12 injections with Benadryl .  Has no issues when taking B12 tablets.    Hydrocodone  Hives   Penicillins Itching and Swelling   Morphine  Sulfate Swelling   Norvasc  [Amlodipine ] Swelling   Oxycodone  Itching   Tylenol  [Acetaminophen ] Rash    HOME MEDICATIONS: Current Outpatient Medications  Medication Sig Dispense Refill   ALPRAZolam  (XANAX ) 0.5 MG tablet Take 1 tablet (0.5 mg total) by mouth 2 (two) times daily as needed for anxiety. 180 tablet 1   ammonium lactate  (LAC-HYDRIN ) 12 % lotion Apply 1 Application topically in  the morning and at bedtime. 225 g 5   cephALEXin  (KEFLEX ) 500 MG capsule Take 1 capsule (500 mg total) by mouth 4 (four) times daily. 40 capsule 0   Cyanocobalamin  (VITAMIN B-12 PO) Take 1 tablet by mouth daily.     cyclobenzaprine  (FLEXERIL ) 10 MG tablet TAKE 1 TABLET BY MOUTH THREE TIMES A DAY 90 tablet 5   dorzolamide -timolol  (COSOPT ) 2-0.5 % ophthalmic solution Place 1 drop into both eyes 2 (two) times daily. 10 mL 1   DULoxetine  (CYMBALTA ) 60 MG capsule Take 1  capsule (60 mg total) by mouth daily. 30 capsule 11   famotidine  (PEPCID ) 20 MG tablet Take 20 mg by mouth 2 (two) times daily.     fluticasone  (FLONASE ) 50 MCG/ACT nasal spray SPRAY 2 SPRAYS INTO EACH NOSTRIL EVERY DAY 16 mL 11   folic acid  (FOLVITE ) 1 MG tablet Take 1 tablet (1 mg total) by mouth daily. 90 tablet 3   furosemide  (LASIX ) 40 MG tablet Take 1 tablet (40 mg total) by mouth 2 (two) times daily. 180 tablet 3   gabapentin  (NEURONTIN ) 300 MG capsule Take 3 capsules (900 mg total) by mouth 3 (three) times daily. 270 capsule 5   hydrocerin (EUCERIN) CREA Apply 1 Application topically 2 (two) times daily.  0   HYDROmorphone  (DILAUDID ) 8 MG tablet Take 1 tablet (8 mg total) by mouth every 6 (six) hours as needed for severe pain (pain score 7-10). 120 tablet 0   HYDROmorphone  (DILAUDID ) 8 MG tablet Take 1 tablet (8 mg total) by mouth every 6 (six) hours as needed for severe pain (pain score 7-10). 120 tablet 0   HYDROmorphone  (DILAUDID ) 8 MG tablet Take 1 tablet (8 mg total) by mouth every 6 (six) hours as needed for severe pain (pain score 7-10). 120 tablet 0   ibuprofen  (ADVIL ) 200 MG tablet Take 400 mg by mouth 2 (two) times daily as needed for headache or moderate pain.     latanoprost  (XALATAN ) 0.005 % ophthalmic solution Place 1 drop into both eyes at bedtime. 2.5 mL 1   magnesium  oxide (MAG-OX) 400 MG tablet Take 1 tablet (400 mg total) by mouth at bedtime. (Patient taking differently: Take 200 mg by mouth at bedtime.) 90 tablet 3   metoprolol  tartrate (LOPRESSOR ) 50 MG tablet Take 1 tablet (50 mg total) by mouth 2 (two) times daily. 180 tablet 3   Multiple Vitamin (MULTIVITAMIN WITH MINERALS) TABS tablet Take 1 tablet by mouth daily. 30 tablet 0   pantoprazole  (PROTONIX ) 40 MG tablet TAKE 1 TABLET BY MOUTH EVERY DAY 90 tablet 3   potassium chloride  (KLOR-CON ) 10 MEQ tablet TAKE 1 TABLET BY MOUTH 2 TIMES DAILY. 180 tablet 2   promethazine  (PHENERGAN ) 25 MG tablet Take 25 mg by mouth  every 6 (six) hours as needed for nausea or vomiting.     senna-docusate (SENOKOT-S) 8.6-50 MG tablet Take 1 tablet by mouth 2 (two) times daily. (Patient taking differently: Take 1 tablet by mouth 2 (two) times daily as needed for moderate constipation.)     traZODone  (DESYREL ) 50 MG tablet TAKE 1 TO 2 TABLETS BY MOUTH AT BEDTIME AS NEEDED FOR SLEEP 180 tablet 1   No current facility-administered medications for this visit.    PAST MEDICAL HISTORY: Past Medical History:  Diagnosis Date   Anxiety    Back pain with radiation    Breast mass, right    Chronic pain    Chronic, continuous use of opioids    Complication of  anesthesia 04/07/14   Allergic reaction to Lisinopril  immediately following surgery   DJD (degenerative joint disease)    Fibromyalgia    Groin abscess    Headache(784.0)    History of IBS    Hypercholesterolemia    IBS (irritable bowel syndrome)    Lactose intolerance    Mild hypertension    Obesity    Tobacco use disorder    Umbilical hernia    Symptomatic    PAST SURGICAL HISTORY: Past Surgical History:  Procedure Laterality Date   ABDOMINAL HYSTERECTOMY     ANTERIOR CERVICAL DECOMP/DISCECTOMY FUSION N/A 12/20/2015   Procedure: ANTERIOR CERVICAL DECOMPRESSION FUSION CERVICAL 4-5, CERVICAL 5-6, CERVICAL 6-7 WITH INSTRUMENTATION AND ALLOGRAFT;  Surgeon: Oneil Priestly, MD;  Location: [facility] OR;  Service: Orthopedics;  Laterality: N/A;  Anterior cervical decompression fusion, cervical 4-5, cervical 5-6, cervical 6-7 with instrumentation and allograft   BACK SURGERY     BILATERAL SALPINGECTOMY  09/03/2012   Procedure: BILATERAL SALPINGECTOMY;  Surgeon: Curlee VEAR Guan, MD;  Location: [facility] ORS;  Service: Gynecology;  Laterality: Bilateral;   BREAST BIOPSY Right 04/07/2014   Procedure: REMOVAL RIGHT BREAST MASS WITH WIRE LOCALIZATION;  Surgeon: Krystal JINNY Russell, MD;  Location: [facility] OR;  Service: General;  Laterality: Right;   BREAST EXCISIONAL BIOPSY Left    x2   BREAST  LUMPECTOMY     x2   CARPAL TUNNEL RELEASE Left 05/11/2019   Procedure: LEFT CARPAL TUNNEL RELEASE, RIGHT TENNIS ELBOW MARCAINE /DEPO MEDROL  INJECTION UNDER ANESTHESIA;  Surgeon: Lucilla Lynwood BRAVO, MD;  Location: [facility] OR;  Service: Orthopedics;  Laterality: Left;   COLONOSCOPY W/ BIOPSIES  04/24/2012   per Dr. Avram, clear, repeat in 10 yrs    disectomy     ESOPHAGOGASTRODUODENOSCOPY     FINGER SURGERY     Right index-excision of mass    FOOT SURGERY Right    Bone Spurs   IR FLUORO GUIDE CV LINE RIGHT  01/29/2023   IR REMOVAL TUN CV CATH W/O FL  02/08/2023   IR US  GUIDE VASC ACCESS RIGHT  01/31/2023   LAPAROSCOPIC HYSTERECTOMY  09/03/2012   Procedure: HYSTERECTOMY TOTAL LAPAROSCOPIC;  Surgeon: Curlee VEAR Guan, MD;  Location: [facility];  Service: Gynecology;  Laterality: N/A;   LUMBAR DISC SURGERY     TUBAL LIGATION     UMBILICAL HERNIA REPAIR N/A 05/01/2018   Procedure: UMBILICAL HERNIA REPAIR;  Surgeon: Kimble Lynwood, MD;  Location: [facility] OR;  Service: General;  Laterality: N/A;    FAMILY HISTORY: Family History  Problem Relation Age of Onset   Diabetes Mother    Diabetes Father    Breast cancer Maternal Aunt 46   Prostate cancer Paternal Grandfather     SOCIAL HISTORY: Social History   Socioeconomic History   Marital status: Married    Spouse name: Marlisha Vanwyk   Number of children: 4   Years of education: Not on file   Highest education level: Not on file  Occupational History   Occupation: Psychologist, Educational  Tobacco Use   Smoking status: Former    Types: Cigarettes   Smokeless tobacco: Never   Tobacco comments:    quit March 2017  Vaping Use   Vaping status: Never Used  Substance and Sexual Activity   Alcohol use: Not Currently   Drug use: Not Currently    Comment: chronic use of dilaudid    Sexual activity: Not Currently    Birth control/protection: Surgical  Other Topics Concern   Not on file  Social History  Narrative   Married - lives with husband and  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-lawWorks in designer, fashion/clothing    4 grown children does not live with her - 1985, 63, 81 (twins)Grandchildren - 4Updated 10/12/2013   Right handed   Caffeine-none   Social Drivers of Health   Financial Resource Strain: Low Risk  (05/06/2022)   Overall Financial Resource Strain (CARDIA)    Difficulty of Paying Living Expenses: Not hard at all  Food Insecurity: No Food Insecurity (01/28/2023)   Hunger Vital Sign    Worried About Running Out of Food in the Last Year: Never true    Ran Out of Food in the Last Year: Never true  Transportation Needs: No Transportation Needs (01/28/2023)   PRAPARE - Administrator, Civil Service (Medical): No    Lack of Transportation (Non-Medical): No  Physical Activity: Inactive (05/06/2022)   Exercise Vital Sign    Days of Exercise per Week: 0 days    Minutes of Exercise per Session: 0 min  Stress: No Stress Concern Present (05/06/2022)   [employer] of Occupational Health - Occupational Stress Questionnaire    Feeling of Stress : Not at all  Social Connections: Unknown (01/25/2022)   Received from [facility]   Social Network    Social Network: Not on file  Intimate Partner Violence: Not At Risk (01/28/2023)   Humiliation, Afraid, Rape, and Kick questionnaire    Fear of Current or Ex-Partner: No    Emotionally Abused: No    Physically Abused: No    Sexually Abused: No      Modena Callander, M.D. Ph.D.  [facility] [address] [place], [place] [address] Ph: [phone] Fax: [phone]  CC:  Johnny Garnette LABOR, MD [address] [place],  [place] [address]  Johnny Garnette LABOR, MD

## 2023-11-13 NOTE — Procedures (Signed)
 Full Name: Dana [name] Gender: Female MRN #: [id number] Date of Birth: 1965/11/16    Visit Date: 11/13/2023 09:10 Age: 58 Years Examining Physician: Dr. Levert Feinstein Referring Physician: Dr. 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  Bilateral tibial motor responses were normal at distal stimulation side, technically difficult study at proximal stimulation side due to her big body habitus.  Bilateral tibial F-wave responses were present, mildly prolonged F-wave latency   Left peroneal to EDB motor responses was normal.  Right peroneal to EDB motor response showed mildly decreased CMAP amplitude.  Right ulnar motor responses were normal, within normal range F-wave latency.  Right median motor response showed mildly prolonged distal latency with normal conduction velocity, CMAP amplitude,  Electromyography: Selected needle examination of the right upper, lower extremity muscles showed no significant abnormalities.  Conclusion: This is an abnormal study.  There is continued evidence of peripheral neuropathy, involving sensory component more than motor component.  There significant improvement compared to previous study in May 2024.    Levert Feinstein. M.D. Ph.D.   [facility] [address] [place], [place] [address] Tel: [phone] Fax: [phone]  Verbal informed consent was obtained from the patient, patient was informed of potential risk of procedure, including bruising, bleeding, hematoma formation, infection, muscle weakness, muscle pain, numbness, among others.        MNC    Nerve / Sites Muscle Latency Ref. Amplitude Ref. Rel Amp Segments Distance Velocity Ref. Area    ms ms mV mV %  cm m/s m/s mVms  R  Median - APB     Wrist APB 4.8 <=4.4 6.7 >=4.0 100 Wrist - APB 7   25.8     Upper arm APB 8.7  6.2  92.2 Upper arm - Wrist 23.6 59 >=49 23.2  R Ulnar - ADM     Wrist ADM 3.0 <=3.3 9.7 >=6.0 100 Wrist - ADM 7   28.9     B.Elbow ADM 4.9  6.8  70.7 B.Elbow - Wrist 9.6 51 >=49 21.4     A.Elbow ADM 9.0  6.4  93 A.Elbow - B.Elbow 23 57 >=49 22.7  R Peroneal - EDB     Ankle EDB 5.5 <=6.5 1.4 >=2.0 100 Ankle - EDB 9   4.9     Fib head EDB 12.8  1.3  92.6 Fib head - Ankle 30 41 >=44 4.3     Pop fossa EDB 14.4  1.8  142 Pop fossa - Fib head 10 60 >=44 7.9         Pop fossa - Ankle      L Peroneal - EDB     Ankle EDB 4.7 <=6.5 2.6 >=2.0 100 Ankle - EDB 9   8.2     Fib head EDB 13.1  1.7  64.3 Fib head - Ankle 31.6 38 >=44 5.1     Pop fossa EDB 14.7  0.7  42.2 Pop fossa - Fib head 9 58 >=44 1.7         Pop fossa - Ankle      R Tibial - AH     Ankle AH 5.0 <=5.8 4.7 >=4.0 100 Ankle - AH 9   14.5     Pop fossa AH  Pop fossa - Ankle   >=41   L Tibial - AH     Ankle AH 4.4 <=5.8 4.9 >=4.0 100 Ankle - AH 9   15.5     Pop fossa AH 14.1  1.6  32.5 Pop fossa - Ankle 38 39 >=41 4.9                 SNC    Nerve / Sites Rec. Site Peak Lat Ref.  Amp Ref. Segments Distance    ms ms V V  cm  R Sural - Ankle (Calf)     Calf Ankle NR <=4.4 NR >=6 Calf - Ankle 14  L Sural - Ankle (Calf)     Calf Ankle NR <=4.4 NR >=6 Calf - Ankle 14  R Superficial peroneal - Ankle     Lat leg Ankle NR <=4.4 NR >=6 Lat leg - Ankle 14  L Superficial peroneal - Ankle     Lat leg Ankle NR <=4.4 NR >=6 Lat leg - Ankle 14  R Median - Orthodromic (Dig II, Mid palm)     Dig II Wrist NR <=3.4 NR >=10 Dig II - Wrist 13  R Ulnar - Orthodromic, (Dig V, Mid palm)     Dig V Wrist NR <=3.1 NR >=5 Dig V - Wrist 8                   F  Wave    Nerve F Lat Ref.   ms ms  R Tibial - AH 55.6 <=56.0  L Tibial - AH 59.6 <=56.0  R Ulnar - ADM 28.3 <=32.0           EMG Summary Table    Spontaneous MUAP Recruitment  Muscle IA Fib  PSW Fasc Other Amp Dur. Poly Pattern  R. First dorsal interosseous Normal None None None _______ Normal Normal Normal Normal  R. Pronator teres Normal None None None _______ Normal Normal Normal Normal  R. Biceps brachii Normal None None None _______ Normal Normal Normal Normal  R. Deltoid Normal None None None _______ Normal Normal Normal Normal  R. Triceps brachii Normal None None None _______ Normal Normal Normal Normal  R. Tibialis anterior Normal None None None _______ Normal Normal Normal Normal  R. Tibialis posterior Normal None None None _______ Normal Normal Normal Normal  R. Peroneus longus Normal None None None _______ Normal Normal Normal Normal  R. Gastrocnemius (Medial head) Normal None None None _______ Normal Normal Normal Normal  R. Vastus lateralis Normal None None None _______ Normal Normal Normal Normal

## 2023-11-14 ENCOUNTER — Telehealth: Payer: Self-pay | Admitting: Family Medicine

## 2023-11-14 NOTE — Telephone Encounter (Signed)
 Patient is needing to continue therapy session she would like a call back regarding this

## 2023-11-14 NOTE — Telephone Encounter (Signed)
 Please renew her PT orders for chronic inflammatory demyelinating polyneuropathy

## 2023-11-17 ENCOUNTER — Ambulatory Visit (INDEPENDENT_AMBULATORY_CARE_PROVIDER_SITE_OTHER): Payer: 59 | Admitting: Surgical

## 2023-11-17 ENCOUNTER — Telehealth: Payer: Self-pay | Admitting: Radiology

## 2023-11-17 ENCOUNTER — Encounter: Payer: Self-pay | Admitting: Surgical

## 2023-11-17 DIAGNOSIS — M1711 Unilateral primary osteoarthritis, right knee: Secondary | ICD-10-CM

## 2023-11-17 DIAGNOSIS — G894 Chronic pain syndrome: Secondary | ICD-10-CM

## 2023-11-17 DIAGNOSIS — M170 Bilateral primary osteoarthritis of knee: Secondary | ICD-10-CM

## 2023-11-17 DIAGNOSIS — M1712 Unilateral primary osteoarthritis, left knee: Secondary | ICD-10-CM

## 2023-11-17 MED ORDER — LIDOCAINE HCL 1 % IJ SOLN
5.0000 mL | INTRAMUSCULAR | Status: AC | PRN
  Administered 2023-11-17: 5 mL

## 2023-11-17 MED ORDER — METHYLPREDNISOLONE ACETATE 40 MG/ML IJ SUSP
40.0000 mg | INTRAMUSCULAR | Status: AC | PRN
  Administered 2023-11-17: 40 mg via INTRA_ARTICULAR

## 2023-11-17 MED ORDER — BUPIVACAINE HCL 0.25 % IJ SOLN
4.0000 mL | INTRAMUSCULAR | Status: AC | PRN
  Administered 2023-11-17: 4 mL via INTRA_ARTICULAR

## 2023-11-17 MED ORDER — BUPIVACAINE HCL 0.25 % IJ SOLN
4.0000 mL | INTRAMUSCULAR | Status: AC | PRN
Start: 2023-11-17 — End: 2023-11-17
  Administered 2023-11-17: 4 mL via INTRA_ARTICULAR

## 2023-11-17 NOTE — Progress Notes (Signed)
 Office Visit Note   [name]           Date of Birth: 19-Oct-1965           MRN: [id number] Visit Date: 11/17/2023 Requested by: Nelwyn Salisbury, MD [address] [place],  [place] [address] PCP: Nelwyn Salisbury, MD  Subjective: Chief Complaint  Patient presents with   Right Knee - Pain    05/15/23 Bilateral Cortisone injection   Left Knee - Pain    05/15/23 Bilateral cortisone injection    HPI: Dana [name] is a 58 y.o. female who presents to the office reporting knee pain.  Has history of knee arthritis.  No new falls or injuries.  No fevers or chills.  Previous injections have provided good relief and they are here today to repeat injections.  No significant new changes.  Last injections were on 05/15/2023.  She is still ambulating with a wheelchair but does have a walker that she uses at home at times.  She has typically about 3 to 4 weeks of relief from these injections.  She has had gel injections in the past which have given her good relief as well though she is not exactly sure how long these have lasted.  Most of her pain is in the right knee at the medial aspect.              ROS: All systems reviewed are negative as they relate to the chief complaint within the history of present illness.  Patient denies fevers or chills.  Assessment & Plan: Visit Diagnoses:  1. Unilateral primary osteoarthritis, left knee   2. Unilateral primary osteoarthritis, right knee     Plan: Patient is a 58 year old female who presents for evaluation of bilateral knee pain.  She has history of bilateral knee osteoarthritis.  Has had serial injections with cortisone with good relief that lasted for about a month.  She would like to repeat these today.  Both injections administered and patient tolerated procedure well without complication.  We will also get her set up for bilateral knee gel injections to see if this will be even more helpful than the cortisone injections have been  recently.  Follow-up after gel injection approval  This patient is diagnosed with osteoarthritis of the knee(s).    Radiographs show evidence of joint space narrowing, osteophytes, subchondral sclerosis and/or subchondral cysts.  This patient has knee pain which interferes with functional and activities of daily living.    This patient has experienced inadequate response, adverse effects and/or intolerance with conservative treatments such as acetaminophen, NSAIDS, topical creams, physical therapy or regular exercise, knee bracing and/or weight loss.   This patient has experienced inadequate response or has a contraindication to intra articular steroid injections for at least 3 months.   This patient is not scheduled to have a total knee replacement within 6 months of starting treatment with viscosupplementation.   Follow-Up Instructions: No follow-ups on file.   Orders:  No orders of the defined types were placed in this encounter.  No orders of the defined types were placed in this encounter.     Procedures: Large Joint Inj: bilateral knee on 11/17/2023 3:56 PM Indications: diagnostic evaluation, joint swelling and pain Details: 18 G 1.5 in needle, superolateral approach  Arthrogram: No  Medications (Right): 5 mL lidocaine 1 %; 4 mL bupivacaine 0.25 %; 40 mg methylPREDNISolone acetate 40 MG/ML Medications (Left): 5 mL lidocaine 1 %; 4 mL bupivacaine 0.25 %;  40 mg methylPREDNISolone acetate 40 MG/ML Outcome: tolerated well, no immediate complications Procedure, treatment alternatives, risks and benefits explained, specific risks discussed. Consent was given by the patient. Immediately prior to procedure a time out was called to verify the correct patient, procedure, equipment, support staff and site/side marked as required. Patient was prepped and draped in the usual sterile fashion.       Clinical Data: No additional findings.  Objective: Vital Signs: LMP 08/17/2012    Physical Exam:  Constitutional: Patient appears well-developed HEENT:  Head: Normocephalic Eyes:EOM are normal Neck: Normal range of motion Cardiovascular: Normal rate Pulmonary/chest: Effort normal Neurologic: Patient is alert Skin: Skin is warm Psychiatric: Patient has normal mood and affect  Ortho Exam: Ortho exam demonstrates knees without cellulitis or skin changes.  Effusion not present in either knee.  No calf tenderness.  Negative Homans' sign.  No pain with hip range of motion.  Able to perform straight leg raise with both lower extremities.  Lower extremities warm and well-perfused.  Moderate to severe tenderness over the medial joint line of the right knee with mild to moderate tenderness over the medial joint line of the left knee.  Specialty Comments:  No specialty comments available.  Imaging: No results found.   PMFS History: Patient Active Problem List   Diagnosis Date Noted   Hyperkeratosis of sole 08/01/2023   CIDP (chronic inflammatory demyelinating polyneuropathy) (HCC) 01/28/2023   Gait abnormality 01/28/2023   Glaucoma 01/28/2023   Pain due to onychomycosis of toenails of both feet 01/21/2023   Wheelchair dependence 12/30/2022   Neuropathic pain 12/30/2022   Insomnia due to medical condition 12/30/2022   Acute inflammatory demyelinating polyneuropathy (HCC) 10/01/2022   UTI (urinary tract infection) 09/24/2022   Hypomagnesemia 09/24/2022   Weakness 09/23/2022   Hypokalemia 09/23/2022   B12 deficiency 09/05/2022   Folate deficiency 09/05/2022   Carpal tunnel syndrome, left upper limb 05/11/2019    Class: Chronic   Spinal stenosis of lumbar region with neurogenic claudication 08/20/2018   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   Primary osteoarthritis of right knee 02/27/2017   Right tennis elbow 11/11/2016   Muscle cramps 01/15/2016   Bilateral leg edema 01/08/2016   Radiculopathy 12/20/2015   Hyperglycemia 12/21/2014   Mastodynia, female  11/30/2014   S/P excision of fibroadenoma of breast 11/30/2014   Angioedema of lips 04/07/2014   Fibroadenoma of right breast 03/07/2014   Pelvic pain in female 06/19/2012   Menorrhagia 06/11/2012   SUI (stress urinary incontinence, female) 06/11/2012   HTN (hypertension) 06/11/2012   IBS (irritable bowel syndrome) - diarrhea predominant 03/17/2012   MICROSCOPIC HEMATURIA 11/30/2010   BREAST PAIN, RIGHT 11/30/2010   BREAST MASS, RIGHT 01/12/2010   HYPERCHOLESTEROLEMIA 12/07/2007   CIGARETTE SMOKER 12/07/2007   Osteoarthritis 12/07/2007   Low back pain with sciatica 12/07/2007   Headache 12/07/2007   Obesity, Class III, BMI 40-49.9 (morbid obesity) (HCC) 08/03/2007   Anxiety state 08/03/2007   Past Medical History:  Diagnosis Date   Anxiety    Back pain with radiation    Breast mass, right    Chronic pain    Chronic, continuous use of opioids    Complication of anesthesia 04/07/14   Allergic reaction to Lisinopril immediately following surgery   DJD (degenerative joint disease)    Fibromyalgia    Groin abscess    Headache(784.0)    History of IBS    Hypercholesterolemia    IBS (irritable bowel syndrome)    Lactose intolerance  Mild hypertension    Obesity    Tobacco use disorder    Umbilical hernia    Symptomatic    Family History  Problem Relation Age of Onset   Diabetes Mother    Diabetes Father    Breast cancer Maternal Aunt 46   Prostate cancer Paternal Grandfather     Past Surgical History:  Procedure Laterality Date   ABDOMINAL HYSTERECTOMY     ANTERIOR CERVICAL DECOMP/DISCECTOMY FUSION N/A 12/20/2015   Procedure: ANTERIOR CERVICAL DECOMPRESSION FUSION CERVICAL 4-5, CERVICAL 5-6, CERVICAL 6-7 WITH INSTRUMENTATION AND ALLOGRAFT;  Surgeon: Estill Bamberg, MD;  Location: [facility] OR;  Service: Orthopedics;  Laterality: N/A;  Anterior cervical decompression fusion, cervical 4-5, cervical 5-6, cervical 6-7 with instrumentation and allograft   BACK SURGERY     BILATERAL  SALPINGECTOMY  09/03/2012   Procedure: BILATERAL SALPINGECTOMY;  Surgeon: Ok Edwards, MD;  Location: [facility] ORS;  Service: Gynecology;  Laterality: Bilateral;   BREAST BIOPSY Right 04/07/2014   Procedure: REMOVAL RIGHT BREAST MASS WITH WIRE LOCALIZATION;  Surgeon: Adolph Pollack, MD;  Location: [facility] OR;  Service: General;  Laterality: Right;   BREAST EXCISIONAL BIOPSY Left    x2   BREAST LUMPECTOMY     x2   CARPAL TUNNEL RELEASE Left 05/11/2019   Procedure: LEFT CARPAL TUNNEL RELEASE, RIGHT TENNIS ELBOW MARCAINE/DEPO MEDROL INJECTION UNDER ANESTHESIA;  Surgeon: Kerrin Champagne, MD;  Location: [facility] OR;  Service: Orthopedics;  Laterality: Left;   COLONOSCOPY W/ BIOPSIES  04/24/2012   per Dr. Leone Payor, clear, repeat in 10 yrs    disectomy     ESOPHAGOGASTRODUODENOSCOPY     FINGER SURGERY     Right index-excision of mass    FOOT SURGERY Right    Bone Spurs   IR FLUORO GUIDE CV LINE RIGHT  01/29/2023   IR REMOVAL TUN CV CATH W/O FL  02/08/2023   IR US GUIDE VASC ACCESS RIGHT  01/31/2023   LAPAROSCOPIC HYSTERECTOMY  09/03/2012   Procedure: HYSTERECTOMY TOTAL LAPAROSCOPIC;  Surgeon: Ok Edwards, MD;  Location: [facility] ORS;  Service: Gynecology;  Laterality: N/A;   LUMBAR DISC SURGERY     TUBAL LIGATION     UMBILICAL HERNIA REPAIR N/A 05/01/2018   Procedure: UMBILICAL HERNIA REPAIR;  Surgeon: Jimmye Norman, MD;  Location: [facility] OR;  Service: General;  Laterality: N/A;   Social History   Occupational History   Occupation: Psychologist, educational  Tobacco Use   Smoking status: Former    Types: Cigarettes   Smokeless tobacco: Never   Tobacco comments:    quit March 2017  Vaping Use   Vaping status: Never Used  Substance and Sexual Activity   Alcohol use: Not Currently   Drug use: Not Currently    Comment: chronic use of dilaudid   Sexual activity: Not Currently    Birth control/protection: Surgical

## 2023-11-17 NOTE — Telephone Encounter (Signed)
 Bilateral knee gel injections approval  Thank you

## 2023-11-18 NOTE — Telephone Encounter (Signed)
 Pt order,demographics and last office note for PT continuation faxed to [facility]

## 2023-11-19 ENCOUNTER — Encounter: Payer: Self-pay | Admitting: Physical Therapy

## 2023-11-19 NOTE — Progress Notes (Signed)
 EMG nerve conduction study report is under the procedure tab

## 2023-11-20 ENCOUNTER — Ambulatory Visit (INDEPENDENT_AMBULATORY_CARE_PROVIDER_SITE_OTHER): Payer: Medicare Other | Admitting: Podiatry

## 2023-11-20 ENCOUNTER — Encounter: Payer: Self-pay | Admitting: Podiatry

## 2023-11-20 VITALS — Ht 64.0 in | Wt 300.0 lb

## 2023-11-20 DIAGNOSIS — B351 Tinea unguium: Secondary | ICD-10-CM

## 2023-11-20 DIAGNOSIS — M79675 Pain in left toe(s): Secondary | ICD-10-CM

## 2023-11-20 DIAGNOSIS — M79674 Pain in right toe(s): Secondary | ICD-10-CM

## 2023-11-20 DIAGNOSIS — R609 Edema, unspecified: Secondary | ICD-10-CM | POA: Insufficient documentation

## 2023-11-20 NOTE — Progress Notes (Signed)
 This patient presents to the office with chief complaint of long thick painful nails.  Patient says the nails are painful walking and wearing shoes.  This patient is unable to self treat.  This patient is unable to trim her nails since she is unable to reach her nails.   She says she has Guillan-Barre syndrome and was hospitalized for 2 weeks. She presents to the office in her motorized wheelchair. She has not been seen in over 10 months.  She presents to the office for preventative foot care services.  General Appearance  Alert, conversant and in no acute stress.  Vascular  Dorsalis pedis and posterior tibial  pulses are  not palpable due to swelling  bilaterally.  Capillary return is within normal limits  bilaterally. Temperature is within normal limits  bilaterally.  Neurologic  Senn-Weinstein monofilament wire test within normal limits  bilaterally. Muscle power within normal limits bilaterally.  Nails Thick disfigured discolored nails with subungual debris  from hallux to fifth toes bilaterally. No evidence of bacterial infection or drainage bilaterally.  Orthopedic  No limitations of motion  feet .  No crepitus or effusions noted.  No bony pathology or digital deformities noted.  Skin  normotropic skin with no porokeratosis noted bilaterally.  No signs of infections or ulcers noted.     Onychomycosis  Nails  B/L.  Pain in right toes  Pain in left toes  Debridement of nails both feet followed trimming the nails with dremel tool.    RTC 3 months.   Helane Gunther DPM

## 2023-11-25 ENCOUNTER — Telehealth: Payer: Self-pay | Admitting: Family Medicine

## 2023-11-25 NOTE — Telephone Encounter (Signed)
 Copied from CRM [id number]. Topic: Referral - Question >> Nov 25, 2023  8:03 AM Gurney Maxin H wrote: Reason for CRM: Patient is calling to check status of physical therapy referral, agent advised patient that referral is "pending review coverage expired", please reach out to patient with some clarity. Patient was a little upset stating she needs to go to therapy, thanks.  Colisha  [phone]

## 2023-11-27 ENCOUNTER — Other Ambulatory Visit: Payer: Self-pay | Admitting: Neurology

## 2023-11-28 NOTE — Telephone Encounter (Signed)
 Spoke with pt advised that I will send a message to the office referral coordinator to look into pt referral. Message sent to [facility] for advise

## 2023-12-02 ENCOUNTER — Encounter: Payer: Self-pay | Admitting: Family Medicine

## 2023-12-02 ENCOUNTER — Telehealth: Payer: Self-pay

## 2023-12-02 ENCOUNTER — Other Ambulatory Visit: Payer: Self-pay | Admitting: Physical Medicine and Rehabilitation

## 2023-12-02 ENCOUNTER — Other Ambulatory Visit: Payer: Self-pay | Admitting: Family Medicine

## 2023-12-02 NOTE — Telephone Encounter (Signed)
 Copied from CRM [id number]. Topic: Appointments - Appointment Scheduling >> Dec 02, 2023 11:31 AM Almira Coaster wrote: Patient/patient representative is calling to schedule an appointment. Refer to attachments for appointment information. Patient called to schedule an appointment with Dr.Fry regarding med management for her pain medication, offered next available date which is 12/15/2023, when I advised patient of this information she got upset and asked to speak with the officed, called [facility] and they were unavailable to take the call. Explained that we can schedule for the next available date, add to cancellation list and send a CRM informing that an appointment was scheduled and we can possibly refill to hold her over until her appointment date. She then requested to speak with the office or Harriett Sine. Please call patient.

## 2023-12-02 NOTE — Telephone Encounter (Signed)
 Copied from CRM [id number]. Topic: General - Other >> Dec 02, 2023  3:41 PM Jon Gills C wrote: Reason for CRM: Patient called in wanting to speak with nancy, is requesting she give her a callback

## 2023-12-03 ENCOUNTER — Telehealth: Payer: Self-pay | Admitting: Family Medicine

## 2023-12-03 MED ORDER — HYDROMORPHONE HCL 8 MG PO TABS: 8.0000 mg | ORAL_TABLET | Freq: Four times a day (QID) | ORAL | 0 refills | Status: DC | PRN

## 2023-12-03 NOTE — Telephone Encounter (Signed)
 Pt is due for PMV for her pain medication states due to Dr Clent Ridges having no availability for appointment until next week, pt is out of medication and requests for a short term Rx sent to her pharmacy until she gets appointment for next week  Please advise

## 2023-12-03 NOTE — Telephone Encounter (Signed)
 Pt needs a refill on Hydromorphone.  She states she is completely out.  E2C2 has tried to make an appt and then I tried to make an appt.  Patient refused both times because she states she needs her pain medicine and needs to have it refilled now.  Pt would like to speak to [place] and requesting a call back.

## 2023-12-03 NOTE — Addendum Note (Signed)
 Addended by: Gershon Crane A on: 12/03/2023 05:07 PM   Modules accepted: Orders

## 2023-12-03 NOTE — Telephone Encounter (Signed)
 Pt message was has been sent to PCP for advise

## 2023-12-03 NOTE — Telephone Encounter (Signed)
 Returned patient's call to schedule.  Pt refused and wanted to speak with Harriett Sine.  A phone note was created and sent back high priority.

## 2023-12-03 NOTE — Telephone Encounter (Signed)
I sent in a 30 day supply

## 2023-12-03 NOTE — Telephone Encounter (Signed)
 Spoke with pt advised that Dr Clent Ridges sent in 30 days supply for her pain medication. Pt has VV appointment for PMV on 12/16/23

## 2023-12-04 NOTE — Telephone Encounter (Signed)
 Spoke with pt aware that Dr Clent Ridges sent in 30 days supply for her pain medication to her pharmacy. Pt has a PMV with Dr Clent Ridges on 12/16/23

## 2023-12-05 NOTE — Telephone Encounter (Signed)
 Pt.notified

## 2023-12-10 NOTE — Telephone Encounter (Signed)
VOB submitted for Durolane, bilateral knee  

## 2023-12-12 ENCOUNTER — Telehealth: Payer: Self-pay

## 2023-12-12 NOTE — Telephone Encounter (Signed)
 Submitted PA online through [phone] for Durolane, bilateral knee PA pending

## 2023-12-15 NOTE — Therapy (Signed)
 OUTPATIENT PHYSICAL THERAPY NEURO EVALUATION   Patient Name: Dana [name] MRN: [id number] DOB:1966-06-12, 58 y.o., female Today's Date: 12/16/2023   PCP: Dana [name] REFERRING PROVIDER: Gershon [name]  END OF SESSION:  PT End of Session - 12/16/23 1137     Visit Number 1    Date for PT Re-Evaluation 02/24/24    Authorization Type [employer]    PT Start Time 1145    PT Stop Time 1230    PT Time Calculation (min) 45 min             Past Medical History:  Diagnosis Date   Anxiety    Back pain with radiation    Breast mass, right    Chronic pain    Chronic, continuous use of opioids    Complication of anesthesia 04/07/14   Allergic reaction to Lisinopril immediately following surgery   DJD (degenerative joint disease)    Fibromyalgia    Groin abscess    Headache(784.0)    History of IBS    Hypercholesterolemia    IBS (irritable bowel syndrome)    Lactose intolerance    Mild hypertension    Obesity    Tobacco use disorder    Umbilical hernia    Symptomatic   Past Surgical History:  Procedure Laterality Date   ABDOMINAL HYSTERECTOMY     ANTERIOR CERVICAL DECOMP/DISCECTOMY FUSION N/A 12/20/2015   Procedure: ANTERIOR CERVICAL DECOMPRESSION FUSION CERVICAL 4-5, CERVICAL 5-6, CERVICAL 6-7 WITH INSTRUMENTATION AND ALLOGRAFT;  Surgeon: Estill Bamberg, MD;  Location: [facility] OR;  Service: Orthopedics;  Laterality: N/A;  Anterior cervical decompression fusion, cervical 4-5, cervical 5-6, cervical 6-7 with instrumentation and allograft   BACK SURGERY     BILATERAL SALPINGECTOMY  09/03/2012   Procedure: BILATERAL SALPINGECTOMY;  Surgeon: Ok Edwards, MD;  Location: [facility];  Service: Gynecology;  Laterality: Bilateral;   BREAST BIOPSY Right 04/07/2014   Procedure: REMOVAL RIGHT BREAST MASS WITH WIRE LOCALIZATION;  Surgeon: Adolph Pollack, MD;  Location: [facility] OR;  Service: General;  Laterality: Right;   BREAST EXCISIONAL BIOPSY Left    x2   BREAST LUMPECTOMY     x2   CARPAL TUNNEL  RELEASE Left 05/11/2019   Procedure: LEFT CARPAL TUNNEL RELEASE, RIGHT TENNIS ELBOW MARCAINE/DEPO MEDROL INJECTION UNDER ANESTHESIA;  Surgeon: Kerrin Champagne, MD;  Location: [facility] OR;  Service: Orthopedics;  Laterality: Left;   COLONOSCOPY W/ BIOPSIES  04/24/2012   per Dr. Leone Payor, clear, repeat in 10 yrs    disectomy     ESOPHAGOGASTRODUODENOSCOPY     FINGER SURGERY     Right index-excision of mass    FOOT SURGERY Right    Bone Spurs   IR FLUORO GUIDE CV LINE RIGHT  01/29/2023   IR REMOVAL TUN CV CATH W/O FL  02/08/2023   IR US GUIDE VASC ACCESS RIGHT  01/31/2023   LAPAROSCOPIC HYSTERECTOMY  09/03/2012   Procedure: HYSTERECTOMY TOTAL LAPAROSCOPIC;  Surgeon: Ok Edwards, MD;  Location: [facility];  Service: Gynecology;  Laterality: N/A;   LUMBAR DISC SURGERY     TUBAL LIGATION     UMBILICAL HERNIA REPAIR N/A 05/01/2018   Procedure: UMBILICAL HERNIA REPAIR;  Surgeon: Jimmye Norman, MD;  Location: [facility] OR;  Service: General;  Laterality: N/A;   Patient Active Problem List   Diagnosis Date Noted   Edema 11/20/2023   Hyperkeratosis of sole 08/01/2023   CIDP (chronic inflammatory demyelinating polyneuropathy) (HCC) 01/28/2023   Gait abnormality 01/28/2023   Glaucoma 01/28/2023   Pain  due to onychomycosis of toenails of both feet 01/21/2023   Wheelchair dependence 12/30/2022   Neuropathic pain 12/30/2022   Insomnia due to medical condition 12/30/2022   Acute inflammatory demyelinating polyneuropathy (HCC) 10/01/2022   UTI (urinary tract infection) 09/24/2022   Hypomagnesemia 09/24/2022   Weakness 09/23/2022   Hypokalemia 09/23/2022   B12 deficiency 09/05/2022   Folate deficiency 09/05/2022   Carpal tunnel syndrome, left upper limb 05/11/2019    Class: Chronic   Spinal stenosis of lumbar region with neurogenic claudication 08/20/2018   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   Primary osteoarthritis of right knee 02/27/2017   Right tennis elbow 11/11/2016   Muscle cramps 01/15/2016    Bilateral leg edema 01/08/2016   Radiculopathy 12/20/2015   Hyperglycemia 12/21/2014   Mastodynia, female 11/30/2014   S/P excision of fibroadenoma of breast 11/30/2014   Angioedema of lips 04/07/2014   Fibroadenoma of right breast 03/07/2014   Pelvic pain in female 06/19/2012   Menorrhagia 06/11/2012   SUI (stress urinary incontinence, female) 06/11/2012   HTN (hypertension) 06/11/2012   IBS (irritable bowel syndrome) - diarrhea predominant 03/17/2012   MICROSCOPIC HEMATURIA 11/30/2010   BREAST PAIN, RIGHT 11/30/2010   BREAST MASS, RIGHT 01/12/2010   HYPERCHOLESTEROLEMIA 12/07/2007   CIGARETTE SMOKER 12/07/2007   Osteoarthritis 12/07/2007   Low back pain with sciatica 12/07/2007   Headache 12/07/2007   Obesity, Class III, BMI 40-49.9 (morbid obesity) (HCC) 08/03/2007   Anxiety state 08/03/2007    ONSET DATE: 12/05/22  REFERRING DIAG:  G89.4 (ICD-10-CM) - Chronic pain syndrome    THERAPY DIAG:  CIDP (chronic inflammatory demyelinating polyneuropathy) (HCC)  Other abnormalities of gait and mobility  Difficulty in walking, not elsewhere classified  Muscle weakness (generalized)  Other lack of coordination  Unsteadiness on feet  Guillain Barr syndrome (HCC)  Rationale for Evaluation and Treatment: Rehabilitation  SUBJECTIVE:                                                                                                                                                                                             SUBJECTIVE STATEMENT: I have been doing exercises, I lost a few pounds. Doing them standing and seated. Walking with walker up and down my hallway. No pain today, got shots in my knee 3 weeks ago.    PERTINENT HISTORY: Pt Continuation for PT due to chronic inflammatory demyelinating polyneuropathy   01/28/23 ASSESSMENT AND PLAN   Dana [name] is a 58 y.o. female   Demyelinating polyradiculoneuropathy             Symptom onset monophasic since September  2024, failed to respond to  IVIG in January 2024,             EMG nerve conduction study showed demyelinating features, no evidence of axonal loss,             Talk with neurohospitalist Dr. Iver Nestle, ED triage, will send her for hospital admission for plasma exchange, please add on following labs, immunofixative protein electrophoresis, ANA with reflex, SSA, SSB titer, iron panel             Starting outpatient IVIG prior authorization process,   NEKISHA MCDIARMID is a 58 y.o. female who [facility] to the [facility] ED on 09/23/2022 with bilateral lower extremity weakness with decreased mobility that has progressed to both arms. She also reported numbness and tingling of the extremities. She was transferred to [facility] for MRI. 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  Inpatient Rehab F/B HHPT.   R knee injection 10/08/22 trigger point injections for worsening back pain 2/15  RA  PAIN:  Are you having pain? No  PRECAUTIONS: None  RED FLAGS: None   WEIGHT BEARING RESTRICTIONS: No  FALLS: Has patient fallen in last 6 months? No  LIVING ENVIRONMENT: Lives with: lives with their family Lives in: House/apartment Stairs: No Has following equipment at home: Environmental consultant - 2 wheeled and Wheelchair (power)  PLOF: Independent and Independent with basic ADLs  PATIENT GOALS: I want to get out this chair and put it down in the basement   OBJECTIVE:  Note: Objective measures were completed at Evaluation unless otherwise noted.  DIAGNOSTIC FINDINGS: Right knee radiographs 11/09/2020   FINDINGS: Severe patellofemoral joint space narrowing. Severe superior and mild inferior patellar degenerative osteophytosis. Moderate superior trochlear degenerative osteophytosis. No joint effusion. Severe medial compartment joint space narrowing with moderate peripheral medial and lateral compartment degenerative osteophytes.   COGNITION: Overall cognitive status: Within functional limits for tasks  assessed   SENSATION: Light touch: still has some numbness in L thigh down into the knee, toes are also numb a little bit   EDEMA: some BLE edema   POSTURE: rounded shoulders and forward head  LOWER EXTREMITY ROM:  grossly WFL for her means, able to move better than last time she was in PT    LOWER EXTREMITY MMT:    MMT Right Eval Left Eval  Hip flexion 4 4  Hip extension    Hip abduction 4 4  Hip adduction 4 4  Hip internal rotation    Hip external rotation    Knee flexion 4 4-  Knee extension 4- 4-  Ankle dorsiflexion    Ankle plantarflexion    Ankle inversion    Ankle eversion    (Blank rows = not tested)   TRANSFERS: Assistive device utilized: Environmental consultant - 2 wheeled and Wheelchair (power)  Sit to stand: Modified independence Stand to sit: Complete Independence Chair to chair: Modified independence  GAIT: Gait pattern: step through pattern, decreased stride length, wide BOS, poor foot clearance- Right, and poor foot clearance- Left Distance walked: in clinic distances Assistive device utilized: Environmental consultant - 2 wheeled Level of assistance: Modified independence Comments: she was able to take a few steps without the walker at eval  FUNCTIONAL TESTS:  5 times sit to stand: 13.14s from elevated mat table  Timed up and go (TUG): 29s with walker  TREATMENT DATE: 12/16/23- EVAL    PATIENT EDUCATION: Education details: POC, aquatic therapy  Person educated: Patient Education method: Explanation Education comprehension: verbalized understanding  HOME EXERCISE PROGRAM: [id number]   GOALS: Goals reviewed with patient? Yes  SHORT TERM GOALS: Target date: 01/20/24  Patient will be independent with initial HEP. Baseline:  Goal status: INITIAL  2.  Patient will demonstrate improved functional LE strength as demonstrated by 5xSTS from low table  <12s. Baseline: 13.14s from elevated mat table  Goal status: INITIAL   LONG TERM GOALS: Target date: 02/24/24  Patient will be independent with advanced/ongoing HEP to improve outcomes and carryover.  Baseline:  Goal status: INITIAL  2.  Patient will be able to ambulate 500' with LRAD with good safety to access community.  Baseline: can walk a few feet with RW Goal status: INITIAL  3.  Patient will demonstrate decreased fall risk by scoring < 15 sec on TUG. Baseline: 29s Goal status: INITIAL  4.  Patient will be able to walk 45ft or more with no AD Baseline: took 10 steps modI  Goal status: INITIAL  5. Patient will increase standing tolerance to 30 mins or longer to complete household and kitchen chores  Baseline: before needing to sit  Goal status: INITIAL   ASSESSMENT:  CLINICAL IMPRESSION: Patient is a 58 y.o. 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 She is currently still getting around in her power chair for the most part. She reports using the walker in her home, using it to get to the bathroom and in the kitchen. States her standing tolerance is about . Patient was able to take a few steps today without an assistive device. She has made great improvements from the last time she was at our clinic. She expresses interest in doing aquatic therapy. We will set her up to do 1x land based and 1x a week aquatic therapy to be able to increase her standing and walking tolerance with and without an AD to improve her functional independence.   OBJECTIVE IMPAIRMENTS: Abnormal gait, decreased activity tolerance, decreased balance, decreased coordination, decreased endurance, difficulty walking, decreased ROM, decreased strength, postural dysfunction, and obesity.   ACTIVITY LIMITATIONS: carrying, lifting, squatting, stairs, transfers, and locomotion  level  PARTICIPATION LIMITATIONS: cleaning, laundry, driving, shopping, and community activity  PERSONAL FACTORS: Fitness, Past/current experiences, Time since onset of injury/illness/exacerbation, and 1-2 comorbidities: chronic pain, GBS  are also affecting patient's functional outcome.   REHAB POTENTIAL: Good  CLINICAL DECISION MAKING: Stable/uncomplicated  EVALUATION COMPLEXITY: Low  PLAN:  PT FREQUENCY: 1-2x/week  PT DURATION: 10 weeks  PLANNED INTERVENTIONS: 97110-Therapeutic exercises, 97530- Therapeutic activity, [id number]- Neuromuscular re-education, 97535- Self Care, 69629- Manual therapy, [phone]- Gait training, [phone]- Aquatic Therapy, Patient/Family education, Balance training, and Stair training  PLAN FOR NEXT SESSION: start with any functional tasks and working on gait and balance. Endurance training    [employer], PT 12/16/2023, 12:27 PM

## 2023-12-16 ENCOUNTER — Encounter: Payer: Self-pay | Admitting: Family Medicine

## 2023-12-16 ENCOUNTER — Ambulatory Visit: Attending: Family Medicine

## 2023-12-16 ENCOUNTER — Telehealth (INDEPENDENT_AMBULATORY_CARE_PROVIDER_SITE_OTHER): Admitting: Family Medicine

## 2023-12-16 DIAGNOSIS — M6281 Muscle weakness (generalized): Secondary | ICD-10-CM

## 2023-12-16 DIAGNOSIS — R2681 Unsteadiness on feet: Secondary | ICD-10-CM

## 2023-12-16 DIAGNOSIS — R262 Difficulty in walking, not elsewhere classified: Secondary | ICD-10-CM | POA: Diagnosis present

## 2023-12-16 DIAGNOSIS — F1190 Opioid use, unspecified, uncomplicated: Secondary | ICD-10-CM | POA: Diagnosis not present

## 2023-12-16 DIAGNOSIS — G894 Chronic pain syndrome: Secondary | ICD-10-CM | POA: Insufficient documentation

## 2023-12-16 DIAGNOSIS — G6181 Chronic inflammatory demyelinating polyneuritis: Secondary | ICD-10-CM | POA: Diagnosis present

## 2023-12-16 DIAGNOSIS — R2689 Other abnormalities of gait and mobility: Secondary | ICD-10-CM

## 2023-12-16 DIAGNOSIS — R278 Other lack of coordination: Secondary | ICD-10-CM

## 2023-12-16 DIAGNOSIS — M5440 Lumbago with sciatica, unspecified side: Secondary | ICD-10-CM

## 2023-12-16 DIAGNOSIS — G610 Guillain-Barre syndrome: Secondary | ICD-10-CM | POA: Diagnosis present

## 2023-12-16 DIAGNOSIS — G8929 Other chronic pain: Secondary | ICD-10-CM | POA: Diagnosis not present

## 2023-12-16 MED ORDER — CELECOXIB 100 MG PO CAPS: 100.0000 mg | ORAL_CAPSULE | Freq: Two times a day (BID) | ORAL | 3 refills | Status: AC

## 2023-12-16 MED ORDER — HYDROMORPHONE HCL 8 MG PO TABS: 8.0000 mg | ORAL_TABLET | Freq: Four times a day (QID) | ORAL | 0 refills | Status: DC | PRN

## 2023-12-16 NOTE — Progress Notes (Signed)
 Subjective:    Patient ID: Dana [name], female    DOB: 1966/04/28, 58 y.o.   MRN: [id number]  HPI Virtual Visit via Video Note  I connected with the patient on 12/16/23 at 11:00 AM EDT by a video enabled telemedicine application and verified that I am speaking with the correct person using two identifiers.  Location patient: home Location provider:work or home office Persons participating in the virtual visit: patient, provider  I discussed the limitations of evaluation and management by telemedicine and the availability of in person appointments. The patient expressed understanding and agreed to proceed.   HPI: Here for pain management. She has had a bit more pain lately, and she asks if she can take more Hydromorphone. She still takes Gabapentin as well.    ROS: See pertinent positives and negatives per HPI.  Past Medical History:  Diagnosis Date   Anxiety    Back pain with radiation    Breast mass, right    Chronic pain    Chronic, continuous use of opioids    Complication of anesthesia 04/07/14   Allergic reaction to Lisinopril immediately following surgery   DJD (degenerative joint disease)    Fibromyalgia    Groin abscess    Headache(784.0)    History of IBS    Hypercholesterolemia    IBS (irritable bowel syndrome)    Lactose intolerance    Mild hypertension    Obesity    Tobacco use disorder    Umbilical hernia    Symptomatic    Past Surgical History:  Procedure Laterality Date   ABDOMINAL HYSTERECTOMY     ANTERIOR CERVICAL DECOMP/DISCECTOMY FUSION N/A 12/20/2015   Procedure: ANTERIOR CERVICAL DECOMPRESSION FUSION CERVICAL 4-5, CERVICAL 5-6, CERVICAL 6-7 WITH INSTRUMENTATION AND ALLOGRAFT;  Surgeon: Estill Bamberg, MD;  Location: [facility] OR;  Service: Orthopedics;  Laterality: N/A;  Anterior cervical decompression fusion, cervical 4-5, cervical 5-6, cervical 6-7 with instrumentation and allograft   BACK SURGERY     BILATERAL SALPINGECTOMY  09/03/2012    Procedure: BILATERAL SALPINGECTOMY;  Surgeon: Ok Edwards, MD;  Location: [facility];  Service: Gynecology;  Laterality: Bilateral;   BREAST BIOPSY Right 04/07/2014   Procedure: REMOVAL RIGHT BREAST MASS WITH WIRE LOCALIZATION;  Surgeon: Adolph Pollack, MD;  Location: [facility] OR;  Service: General;  Laterality: Right;   BREAST EXCISIONAL BIOPSY Left    x2   BREAST LUMPECTOMY     x2   CARPAL TUNNEL RELEASE Left 05/11/2019   Procedure: LEFT CARPAL TUNNEL RELEASE, RIGHT TENNIS ELBOW MARCAINE/DEPO MEDROL INJECTION UNDER ANESTHESIA;  Surgeon: Kerrin Champagne, MD;  Location: [facility] OR;  Service: Orthopedics;  Laterality: Left;   COLONOSCOPY W/ BIOPSIES  04/24/2012   per Dr. Leone Payor, clear, repeat in 10 yrs    disectomy     ESOPHAGOGASTRODUODENOSCOPY     FINGER SURGERY     Right index-excision of mass    FOOT SURGERY Right    Bone Spurs   IR FLUORO GUIDE CV LINE RIGHT  01/29/2023   IR REMOVAL TUN CV CATH W/O FL  02/08/2023   IR US GUIDE VASC ACCESS RIGHT  01/31/2023   LAPAROSCOPIC HYSTERECTOMY  09/03/2012   Procedure: HYSTERECTOMY TOTAL LAPAROSCOPIC;  Surgeon: Ok Edwards, MD;  Location: [facility];  Service: Gynecology;  Laterality: N/A;   LUMBAR DISC SURGERY     TUBAL LIGATION     UMBILICAL HERNIA REPAIR N/A 05/01/2018   Procedure: UMBILICAL HERNIA REPAIR;  Surgeon: Jimmye Norman, MD;  Location: [facility] OR;  Service: General;  Laterality: N/A;    Family History  Problem Relation Age of Onset   Diabetes Mother    Diabetes Father    Breast cancer Maternal Aunt 46   Prostate cancer Paternal Grandfather      Current Outpatient Medications:    ALPRAZolam (XANAX) 0.5 MG tablet, Take 1 tablet (0.5 mg total) by mouth 2 (two) times daily as needed for anxiety., Disp: 180 tablet, Rfl: 1   ammonium lactate (LAC-HYDRIN) 12 % lotion, APPLY TOPICALLY IN THE MORNING AND AT BEDTIME, Disp: 400 mL, Rfl: 3   benzonatate (TESSALON) 200 MG capsule, TAKE 1 CAPSULE (200 MG TOTAL) BY MOUTH EVERY 6 (SIX) HOURS AS NEEDED  FOR COUGH., Disp: 60 capsule, Rfl: 3   cephALEXin (KEFLEX) 500 MG capsule, Take 1 capsule (500 mg total) by mouth 4 (four) times daily., Disp: 40 capsule, Rfl: 0   Cyanocobalamin (VITAMIN B-12 PO), Take 1 tablet by mouth daily., Disp: , Rfl:    cyclobenzaprine (FLEXERIL) 10 MG tablet, TAKE 1 TABLET BY MOUTH THREE TIMES A DAY, Disp: 90 tablet, Rfl: 5   dorzolamide-timolol (COSOPT) 2-0.5 % ophthalmic solution, Place 1 drop into both eyes 2 (two) times daily., Disp: 10 mL, Rfl: 1   DULoxetine (CYMBALTA) 60 MG capsule, TAKE 1 CAPSULE BY MOUTH EVERY DAY, Disp: 90 capsule, Rfl: 4   famotidine (PEPCID) 20 MG tablet, Take 20 mg by mouth 2 (two) times daily., Disp: , Rfl:    fluticasone (FLONASE) 50 MCG/ACT nasal spray, SPRAY 2 SPRAYS INTO EACH NOSTRIL EVERY DAY, Disp: 16 mL, Rfl: 11   folic acid (FOLVITE) 1 MG tablet, TAKE 1 TABLET BY MOUTH EVERY DAY, Disp: 90 tablet, Rfl: 3   furosemide (LASIX) 40 MG tablet, Take 1 tablet (40 mg total) by mouth 2 (two) times daily., Disp: 180 tablet, Rfl: 3   gabapentin (NEURONTIN) 300 MG capsule, Take 3 capsules (900 mg total) by mouth 3 (three) times daily., Disp: 270 capsule, Rfl: 5   hydrocerin (EUCERIN) CREA, Apply 1 Application topically 2 (two) times daily., Disp: , Rfl: 0   HYDROmorphone (DILAUDID) 8 MG tablet, Take 1 tablet (8 mg total) by mouth every 6 (six) hours as needed for severe pain (pain score 7-10)., Disp: 120 tablet, Rfl: 0   HYDROmorphone (DILAUDID) 8 MG tablet, Take 1 tablet (8 mg total) by mouth every 6 (six) hours as needed for severe pain (pain score 7-10)., Disp: 120 tablet, Rfl: 0   HYDROmorphone (DILAUDID) 8 MG tablet, Take 1 tablet (8 mg total) by mouth every 6 (six) hours as needed for severe pain (pain score 7-10)., Disp: 120 tablet, Rfl: 0   ibuprofen (ADVIL) 200 MG tablet, Take 400 mg by mouth 2 (two) times daily as needed for headache or moderate pain., Disp: , Rfl:    latanoprost (XALATAN) 0.005 % ophthalmic solution, Place 1 drop into  both eyes at bedtime., Disp: 2.5 mL, Rfl: 1   magnesium oxide (MAG-OX) 400 MG tablet, Take 1 tablet (400 mg total) by mouth at bedtime. (Patient taking differently: Take 200 mg by mouth at bedtime.), Disp: 90 tablet, Rfl: 3   metoprolol tartrate (LOPRESSOR) 50 MG tablet, Take 1 tablet (50 mg total) by mouth 2 (two) times daily., Disp: 180 tablet, Rfl: 3   Multiple Vitamin (MULTIVITAMIN WITH MINERALS) TABS tablet, Take 1 tablet by mouth daily., Disp: 30 tablet, Rfl: 0   pantoprazole (PROTONIX) 40 MG tablet, TAKE 1 TABLET BY MOUTH EVERY DAY, Disp: 90 tablet, Rfl: 3   potassium  chloride (KLOR-CON) 10 MEQ tablet, TAKE 1 TABLET BY MOUTH 2 TIMES DAILY., Disp: 180 tablet, Rfl: 2   promethazine (PHENERGAN) 25 MG tablet, Take 25 mg by mouth every 6 (six) hours as needed for nausea or vomiting., Disp: , Rfl:    senna-docusate (SENOKOT-S) 8.6-50 MG tablet, Take 1 tablet by mouth 2 (two) times daily. (Patient taking differently: Take 1 tablet by mouth 2 (two) times daily as needed for moderate constipation.), Disp: , Rfl:    traZODone (DESYREL) 50 MG tablet, TAKE 1 TO 2 TABLETS BY MOUTH AT BEDTIME AS NEEDED FOR SLEEP, Disp: 180 tablet, Rfl: 1  EXAM:  VITALS per patient if applicable:  GENERAL: alert, oriented, appears well and in no acute distress  HEENT: atraumatic, conjunttiva clear, no obvious abnormalities on inspection of external nose and ears  NECK: normal movements of the head and neck  LUNGS: on inspection no signs of respiratory distress, breathing rate appears normal, no obvious gross SOB, gasping or wheezing  CV: no obvious cyanosis  MS: moves all visible extremities without noticeable abnormality  PSYCH/NEURO: pleasant and cooperative, no obvious depression or anxiety, speech and thought processing grossly intact  ASSESSMENT AND PLAN: Pain management. Indication for chronic opioid: low backpain Medication and dose: Hydromorphone 8 mg  # pills per month: 120 Last UDS date:  12-11-22 Opioid Treatment Agreement signed (Y/N): 12-29-17 Opioid Treatment Agreement last reviewed with patient:  12-16-23 [facility] reviewed this encounter (include red flags): Yes We refilled the Hydromorphone for one month only. She needs to come by for a UDS for any refills after that. Instead of increasing the dosing of this, we will add Celebrex 100 mg BID. Gershon Crane, MD  Discussed the following assessment and plan:  No diagnosis found.     I discussed the assessment and treatment plan with the patient. The patient was provided an opportunity to ask questions and all were answered. The patient agreed with the plan and demonstrated an understanding of the instructions.   The patient was advised to call back or seek an in-person evaluation if the symptoms worsen or if the condition fails to improve as anticipated.      Review of Systems     Objective:   Physical Exam        Assessment & Plan:

## 2023-12-18 ENCOUNTER — Telehealth: Payer: Self-pay | Admitting: *Deleted

## 2023-12-18 NOTE — Telephone Encounter (Signed)
 Copied from CRM [id number]. Topic: Clinical - Medication Question >> Dec 18, 2023 11:51 AM Alcus Dad wrote: Reason for CRM: Patient needs Dr. Clent Ridges to write prescription for broken electric wheel chair. Please fax order to [phone]

## 2023-12-22 ENCOUNTER — Telehealth: Payer: Self-pay

## 2023-12-22 NOTE — Telephone Encounter (Signed)
 This RX is ready to fax

## 2023-12-22 NOTE — Telephone Encounter (Signed)
 Copied from CRM [id number]. Topic: Clinical - Prescription Issue >> Dec 22, 2023  4:00 PM Alcus Dad wrote: Reason for CRM: Dana [name] from [facility] stated that patient received prescription for the electric wheel chair on order but it needs to state wheel chair repairs. Please fax asap @ [phone]

## 2023-12-25 NOTE — Telephone Encounter (Signed)
Pt order faxed as requested.

## 2023-12-25 NOTE — Telephone Encounter (Signed)
 Pt orders faxed as requested

## 2023-12-28 LAB — DM TEMPLATE

## 2023-12-28 LAB — DRUG MONITORING, PANEL 8 WITH CONFIRMATION, URINE
6 Acetylmorphine: NEGATIVE ng/mL (ref <10)
Alcohol Metabolites: POSITIVE ng/mL — AB (ref <500)
Amphetamines: NEGATIVE ng/mL (ref <500)
Benzodiazepines: NEGATIVE ng/mL (ref <100)
Buprenorphine, Urine: NEGATIVE ng/mL (ref <5)
Cocaine Metabolite: NEGATIVE ng/mL (ref <150)
Codeine: NEGATIVE ng/mL (ref <50)
Creatinine: 46.6 mg/dL (ref >=20.0)
Ethyl Glucuronide (ETG): 4721 ng/mL — ABNORMAL HIGH (ref <500)
Ethyl Sulfate (ETS): 2499 ng/mL — ABNORMAL HIGH (ref <100)
Hydrocodone: NEGATIVE ng/mL (ref <50)
Hydromorphone: 3676 ng/mL — ABNORMAL HIGH (ref <50)
MDMA: NEGATIVE ng/mL (ref <500)
Marijuana Metabolite: NEGATIVE ng/mL (ref <20)
Morphine: NEGATIVE ng/mL (ref <50)
Norhydrocodone: NEGATIVE ng/mL (ref <50)
Opiates: POSITIVE ng/mL — AB (ref <100)
Oxidant: NEGATIVE ug/mL (ref <200)
Oxycodone: NEGATIVE ng/mL (ref <100)
pH: 6.2 (ref 4.5–9.0)

## 2023-12-29 ENCOUNTER — Other Ambulatory Visit: Payer: Self-pay | Admitting: Physical Medicine and Rehabilitation

## 2023-12-29 NOTE — Therapy (Signed)
 OUTPATIENT PHYSICAL THERAPY NEURO TREATMENT   Patient Name: Dana [name] MRN: [id number] DOB:1965/10/14, 58 y.o., female Today's Date: 12/30/2023   PCP: Gershon Crane REFERRING PROVIDER: Gershon Crane  END OF SESSION:  PT End of Session - 12/30/23 1226     Visit Number 2    Date for PT Re-Evaluation 02/24/24    Authorization Type [employer]    PT Start Time 1230    PT Stop Time 1315    PT Time Calculation (min) 45 min              Past Medical History:  Diagnosis Date   Anxiety    Back pain with radiation    Breast mass, right    Chronic pain    Chronic, continuous use of opioids    Complication of anesthesia 04/07/14   Allergic reaction to Lisinopril immediately following surgery   DJD (degenerative joint disease)    Fibromyalgia    Groin abscess    Headache(784.0)    History of IBS    Hypercholesterolemia    IBS (irritable bowel syndrome)    Lactose intolerance    Mild hypertension    Obesity    Tobacco use disorder    Umbilical hernia    Symptomatic   Past Surgical History:  Procedure Laterality Date   ABDOMINAL HYSTERECTOMY     ANTERIOR CERVICAL DECOMP/DISCECTOMY FUSION N/A 12/20/2015   Procedure: ANTERIOR CERVICAL DECOMPRESSION FUSION CERVICAL 4-5, CERVICAL 5-6, CERVICAL 6-7 WITH INSTRUMENTATION AND ALLOGRAFT;  Surgeon: Estill Bamberg, MD;  Location: [facility] OR;  Service: Orthopedics;  Laterality: N/A;  Anterior cervical decompression fusion, cervical 4-5, cervical 5-6, cervical 6-7 with instrumentation and allograft   BACK SURGERY     BILATERAL SALPINGECTOMY  09/03/2012   Procedure: BILATERAL SALPINGECTOMY;  Surgeon: Ok Edwards, MD;  Location: [facility];  Service: Gynecology;  Laterality: Bilateral;   BREAST BIOPSY Right 04/07/2014   Procedure: REMOVAL RIGHT BREAST MASS WITH WIRE LOCALIZATION;  Surgeon: Adolph Pollack, MD;  Location: [facility] OR;  Service: General;  Laterality: Right;   BREAST EXCISIONAL BIOPSY Left    x2   BREAST LUMPECTOMY     x2   CARPAL  TUNNEL RELEASE Left 05/11/2019   Procedure: LEFT CARPAL TUNNEL RELEASE, RIGHT TENNIS ELBOW MARCAINE/DEPO MEDROL INJECTION UNDER ANESTHESIA;  Surgeon: Kerrin Champagne, MD;  Location: [facility] OR;  Service: Orthopedics;  Laterality: Left;   COLONOSCOPY W/ BIOPSIES  04/24/2012   per Dr. Leone Payor, clear, repeat in 10 yrs    disectomy     ESOPHAGOGASTRODUODENOSCOPY     FINGER SURGERY     Right index-excision of mass    FOOT SURGERY Right    Bone Spurs   IR FLUORO GUIDE CV LINE RIGHT  01/29/2023   IR REMOVAL TUN CV CATH W/O FL  02/08/2023   IR US GUIDE VASC ACCESS RIGHT  01/31/2023   LAPAROSCOPIC HYSTERECTOMY  09/03/2012   Procedure: HYSTERECTOMY TOTAL LAPAROSCOPIC;  Surgeon: Ok Edwards, MD;  Location: [facility];  Service: Gynecology;  Laterality: N/A;   LUMBAR DISC SURGERY     TUBAL LIGATION     UMBILICAL HERNIA REPAIR N/A 05/01/2018   Procedure: UMBILICAL HERNIA REPAIR;  Surgeon: Jimmye Norman, MD;  Location: [facility] OR;  Service: General;  Laterality: N/A;   Patient Active Problem List   Diagnosis Date Noted   Edema 11/20/2023   Hyperkeratosis of sole 08/01/2023   CIDP (chronic inflammatory demyelinating polyneuropathy) (HCC) 01/28/2023   Gait abnormality 01/28/2023   Glaucoma 01/28/2023  Pain due to onychomycosis of toenails of both feet 01/21/2023   Wheelchair dependence 12/30/2022   Neuropathic pain 12/30/2022   Insomnia due to medical condition 12/30/2022   Acute inflammatory demyelinating polyneuropathy (HCC) 10/01/2022   UTI (urinary tract infection) 09/24/2022   Hypomagnesemia 09/24/2022   Weakness 09/23/2022   Hypokalemia 09/23/2022   B12 deficiency 09/05/2022   Folate deficiency 09/05/2022   Carpal tunnel syndrome, left upper limb 05/11/2019    Class: Chronic   Spinal stenosis of lumbar region with neurogenic claudication 08/20/2018   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   Primary osteoarthritis of right knee 02/27/2017   Right tennis elbow 11/11/2016   Muscle cramps  01/15/2016   Bilateral leg edema 01/08/2016   Radiculopathy 12/20/2015   Hyperglycemia 12/21/2014   Mastodynia, female 11/30/2014   S/P excision of fibroadenoma of breast 11/30/2014   Angioedema of lips 04/07/2014   Fibroadenoma of right breast 03/07/2014   Pelvic pain in female 06/19/2012   Menorrhagia 06/11/2012   SUI (stress urinary incontinence, female) 06/11/2012   HTN (hypertension) 06/11/2012   IBS (irritable bowel syndrome) - diarrhea predominant 03/17/2012   MICROSCOPIC HEMATURIA 11/30/2010   BREAST PAIN, RIGHT 11/30/2010   BREAST MASS, RIGHT 01/12/2010   HYPERCHOLESTEROLEMIA 12/07/2007   CIGARETTE SMOKER 12/07/2007   Osteoarthritis 12/07/2007   Low back pain with sciatica 12/07/2007   Headache 12/07/2007   Obesity, Class III, BMI 40-49.9 (morbid obesity) (HCC) 08/03/2007   Anxiety state 08/03/2007    ONSET DATE: 12/05/22  REFERRING DIAG:  G89.4 (ICD-10-CM) - Chronic pain syndrome    THERAPY DIAG:  CIDP (chronic inflammatory demyelinating polyneuropathy) (HCC)  Other abnormalities of gait and mobility  Difficulty in walking, not elsewhere classified  Muscle weakness (generalized)  Other lack of coordination  Unsteadiness on feet  Guillain Barr syndrome (HCC)  Rationale for Evaluation and Treatment: Rehabilitation  SUBJECTIVE:                                                                                                                                                                                             SUBJECTIVE STATEMENT: I have been doing exercises, I lost a few pounds. Doing them standing and seated. Walking with walker up and down my hallway. No pain today, got shots in my knee 3 weeks ago.    PERTINENT HISTORY: Pt Continuation for PT due to chronic inflammatory demyelinating polyneuropathy   01/28/23 ASSESSMENT AND PLAN   Dana [name] is a 58 y.o. female   Demyelinating polyradiculoneuropathy             Symptom onset monophasic  since September 2024, failed to respond  to IVIG in January 2024,             EMG nerve conduction study showed demyelinating features, no evidence of axonal loss,             Talk with neurohospitalist Dr. Iver Nestle, ED triage, will send her for hospital admission for plasma exchange, please add on following labs, immunofixative protein electrophoresis, ANA with reflex, SSA, SSB titer, iron panel             Starting outpatient IVIG prior authorization process,   Dana [name] is a 58 y.o. female who presented to the [facility] ED on 09/23/2022 with bilateral lower extremity weakness with decreased mobility that has progressed to both arms. She also reported numbness and tingling of the extremities. She was transferred to [facility] for [facility]. 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  Inpatient Rehab F/B HHPT.   R knee injection 10/08/22 trigger point injections for worsening back pain 2/15  RA  PAIN:  Are you having pain? No  PRECAUTIONS: None  RED FLAGS: None   WEIGHT BEARING RESTRICTIONS: No  FALLS: Has patient fallen in last 6 months? No  LIVING ENVIRONMENT: Lives with: lives with their family Lives in: House/apartment Stairs: No Has following equipment at home: Environmental consultant - 2 wheeled and Wheelchair (power)  PLOF: Independent and Independent with basic ADLs  PATIENT GOALS: I want to get out this chair and put it down in the basement   OBJECTIVE:  Note: Objective measures were completed at Evaluation unless otherwise noted.  DIAGNOSTIC FINDINGS: Right knee radiographs 11/09/2020   FINDINGS: Severe patellofemoral joint space narrowing. Severe superior and mild inferior patellar degenerative osteophytosis. Moderate superior trochlear degenerative osteophytosis. No joint effusion. Severe medial compartment joint space narrowing with moderate peripheral medial and lateral compartment degenerative osteophytes.   COGNITION: Overall cognitive status: Within functional  limits for tasks assessed   SENSATION: Light touch: still has some numbness in L thigh down into the knee, toes are also numb a little bit   EDEMA: some BLE edema   POSTURE: rounded shoulders and forward head  LOWER EXTREMITY ROM:  grossly WFL for her means, able to move better than last time she was in PT    LOWER EXTREMITY MMT:    MMT Right Eval Left Eval  Hip flexion 4 4  Hip extension    Hip abduction 4 4  Hip adduction 4 4  Hip internal rotation    Hip external rotation    Knee flexion 4 4-  Knee extension 4- 4-  Ankle dorsiflexion    Ankle plantarflexion    Ankle inversion    Ankle eversion    (Blank rows = not tested)   TRANSFERS: Assistive device utilized: Environmental consultant - 2 wheeled and Wheelchair (power)  Sit to stand: Modified independence Stand to sit: Complete Independence Chair to chair: Modified independence  GAIT: Gait pattern: step through pattern, decreased stride length, wide BOS, poor foot clearance- Right, and poor foot clearance- Left Distance walked: in clinic distances Assistive device utilized: Environmental consultant - 2 wheeled Level of assistance: Modified independence Comments: she was able to take a few steps without the walker at eval  FUNCTIONAL TESTS:  5 times sit to stand: 13.14s from elevated mat table  Timed up and go (TUG): 29s with walker  TREATMENT DATE:  12/30/23 NuStep L5x43mins  Standing marching with RW Standing hip abd with RW 2x10 STS 2x10 Walking laps 126ft, 143ft    12/16/23- EVAL    PATIENT EDUCATION: Education details: POC, aquatic therapy  Person educated: Patient Education method: Explanation Education comprehension: verbalized understanding  HOME EXERCISE PROGRAM: [id number]   GOALS: Goals reviewed with patient? Yes  SHORT TERM GOALS: Target date: 01/20/24  Patient will be independent with initial  HEP. Baseline:  Goal status: INITIAL  2.  Patient will demonstrate improved functional LE strength as demonstrated by 5xSTS from low table <12s. Baseline: 13.14s from elevated mat table  Goal status: INITIAL   LONG TERM GOALS: Target date: 02/24/24  Patient will be independent with advanced/ongoing HEP to improve outcomes and carryover.  Baseline:  Goal status: INITIAL  2.  Patient will be able to ambulate 500' with LRAD with good safety to access community.  Baseline: can walk with RW Goal status: INITIAL  3.  Patient will demonstrate decreased fall risk by scoring < 15 sec on TUG. Baseline: 29s Goal status: INITIAL  4.  Patient will be able to walk 53ft or more with no AD Baseline: took 10 steps modI  Goal status: INITIAL  5. Patient will increase standing tolerance to 30 mins or longer to complete household and kitchen chores  Baseline: before needing to sit  Goal status: INITIAL   ASSESSMENT:  CLINICAL IMPRESSION: Patient continues to be very motivated and is doing her exercises at home. We started today with some functional strengthening and standing activities. She is starting aquatic therapy on 4/29. Does really well with STS today. Breaks taken as needed.    Patient is a 58 y.o. 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 She is currently still getting around in her power chair for the most part. She reports using the walker in her home, using it to get to the bathroom and in the kitchen. States her standing tolerance is about . Patient was able to take a few steps today without an assistive device. She has made great improvements from the last time she was at our clinic. She expresses interest in doing aquatic therapy. We will set her up to do 1x land based and 1x a week aquatic therapy to be able to increase her standing and walking tolerance with  and without an AD to improve her functional independence.   OBJECTIVE IMPAIRMENTS: Abnormal gait, decreased activity tolerance, decreased balance, decreased coordination, decreased endurance, difficulty walking, decreased ROM, decreased strength, postural dysfunction, and obesity.   ACTIVITY LIMITATIONS: carrying, lifting, squatting, stairs, transfers, and locomotion level  PARTICIPATION LIMITATIONS: cleaning, laundry, driving, shopping, and community activity  PERSONAL FACTORS: Fitness, Past/current experiences, Time since onset of injury/illness/exacerbation, and 1-2 comorbidities: chronic pain, GBS  are also affecting patient's functional outcome.   REHAB POTENTIAL: Good  CLINICAL DECISION MAKING: Stable/uncomplicated  EVALUATION COMPLEXITY: Low  PLAN:  PT FREQUENCY: 1-2x/week  PT DURATION: 10 weeks  PLANNED INTERVENTIONS: 97110-Therapeutic exercises, 97530- Therapeutic activity, [id number]- Neuromuscular re-education, 97535- Self Care, [id number]- Manual therapy, [phone]- Gait training, [phone]- Aquatic Therapy, Patient/Family education, Balance training, and Stair training  PLAN FOR NEXT SESSION: start with any functional tasks and working on gait and balance. Endurance training    [employer], PT 12/30/2023, 1:11 PM

## 2023-12-30 ENCOUNTER — Ambulatory Visit

## 2023-12-30 DIAGNOSIS — R262 Difficulty in walking, not elsewhere classified: Secondary | ICD-10-CM

## 2023-12-30 DIAGNOSIS — G610 Guillain-Barre syndrome: Secondary | ICD-10-CM

## 2023-12-30 DIAGNOSIS — R2689 Other abnormalities of gait and mobility: Secondary | ICD-10-CM

## 2023-12-30 DIAGNOSIS — R2681 Unsteadiness on feet: Secondary | ICD-10-CM

## 2023-12-30 DIAGNOSIS — G6181 Chronic inflammatory demyelinating polyneuritis: Secondary | ICD-10-CM

## 2023-12-30 DIAGNOSIS — R278 Other lack of coordination: Secondary | ICD-10-CM

## 2023-12-30 DIAGNOSIS — M6281 Muscle weakness (generalized): Secondary | ICD-10-CM

## 2023-12-31 ENCOUNTER — Telehealth: Payer: Self-pay | Admitting: Neurology

## 2023-12-31 NOTE — Telephone Encounter (Signed)
 Pt states that a portion of her medical insurance [facility] [employer] [phone]) is telling her they have no record of her being on infusion therapy, Pt states they have advised her to ask that a RN calls them to discuss the medication she gets every 3-4 weeks from home, please call pt to discuss.

## 2024-01-01 NOTE — Telephone Encounter (Signed)
 Note correction, Octagam 10% 85g every 3 weeks, ( not 85 mg)

## 2024-01-01 NOTE — Telephone Encounter (Signed)
 Pt is asking for a call back from April, RN, pt is returning her call.

## 2024-01-01 NOTE — Telephone Encounter (Signed)
 Call to patient, no answer. Left message advising patient that [facility] is handling her requests and making sure filing with insurance /billing is correct. Advised to call back if needed

## 2024-01-01 NOTE — Telephone Encounter (Signed)
 Call to patient, no answer. Left message to return call.   Call to [facility], [phone] Spoke with Ellison Ha. Patient receiving Octagam 10% 85 mg every 3 weeks. She will follow up with [facility] health plans and patient to ensure billing is correct.

## 2024-01-05 ENCOUNTER — Ambulatory Visit (HOSPITAL_BASED_OUTPATIENT_CLINIC_OR_DEPARTMENT_OTHER): Admitting: Physical Therapy

## 2024-01-07 NOTE — Therapy (Signed)
 OUTPATIENT PHYSICAL THERAPY NEURO TREATMENT   Patient Name: Dana [name] MRN: [id number] DOB:[birth year], 58 y.o., female Today's Date: 01/08/2024   PCP: Corita Diego REFERRING PROVIDER: Corita Diego  END OF SESSION:  PT End of Session - 01/08/24 1056     Visit Number 3    Date for PT Re-Evaluation 02/24/24    Authorization Type [employer]    PT Start Time 1100    PT Stop Time 1145    PT Time Calculation (min) 45 min               Past Medical History:  Diagnosis Date   Anxiety    Back pain with radiation    Breast mass, right    Chronic pain    Chronic, continuous use of opioids    Complication of anesthesia 04/07/14   Allergic reaction to Lisinopril  immediately following surgery   DJD (degenerative joint disease)    Fibromyalgia    Groin abscess    Headache(784.0)    History of IBS    Hypercholesterolemia    IBS (irritable bowel syndrome)    Lactose intolerance    Mild hypertension    Obesity    Tobacco use disorder    Umbilical hernia    Symptomatic   Past Surgical History:  Procedure Laterality Date   ABDOMINAL HYSTERECTOMY     ANTERIOR CERVICAL DECOMP/DISCECTOMY FUSION N/A 12/20/2015   Procedure: ANTERIOR CERVICAL DECOMPRESSION FUSION CERVICAL 4-5, CERVICAL 5-6, CERVICAL 6-7 WITH INSTRUMENTATION AND ALLOGRAFT;  Surgeon: Virl Grimes, MD;  Location: [facility] OR;  Service: Orthopedics;  Laterality: N/A;  Anterior cervical decompression fusion, cervical 4-5, cervical 5-6, cervical 6-7 with instrumentation and allograft   BACK SURGERY     BILATERAL SALPINGECTOMY  09/03/2012   Procedure: BILATERAL SALPINGECTOMY;  Surgeon: Davia Erps, MD;  Location: [facility];  Service: Gynecology;  Laterality: Bilateral;   BREAST BIOPSY Right 04/07/2014   Procedure: REMOVAL RIGHT BREAST MASS WITH WIRE LOCALIZATION;  Surgeon: Harlee Lichtenstein, MD;  Location: [facility] OR;  Service: General;  Laterality: Right;   BREAST EXCISIONAL BIOPSY Left    x2   BREAST LUMPECTOMY     x2   CARPAL  TUNNEL RELEASE Left 05/11/2019   Procedure: LEFT CARPAL TUNNEL RELEASE, RIGHT TENNIS ELBOW MARCAINE /DEPO MEDROL  INJECTION UNDER ANESTHESIA;  Surgeon: Alphonso Jean, MD;  Location: [facility] OR;  Service: Orthopedics;  Laterality: Left;   COLONOSCOPY W/ BIOPSIES  04/24/2012   per Dr. Willy Harvest, clear, repeat in 10 yrs    disectomy     ESOPHAGOGASTRODUODENOSCOPY     FINGER SURGERY     Right index-excision of mass    FOOT SURGERY Right    Bone Spurs   IR FLUORO GUIDE CV LINE RIGHT  01/29/2023   IR REMOVAL TUN CV CATH W/O FL  02/08/2023   IR US  GUIDE VASC ACCESS RIGHT  01/31/2023   LAPAROSCOPIC HYSTERECTOMY  09/03/2012   Procedure: HYSTERECTOMY TOTAL LAPAROSCOPIC;  Surgeon: Davia Erps, MD;  Location: [facility];  Service: Gynecology;  Laterality: N/A;   LUMBAR DISC SURGERY     TUBAL LIGATION     UMBILICAL HERNIA REPAIR N/A 05/01/2018   Procedure: UMBILICAL HERNIA REPAIR;  Surgeon: Jerryl Morin, MD;  Location: [facility] OR;  Service: General;  Laterality: N/A;   Patient Active Problem List   Diagnosis Date Noted   Edema 11/20/2023   Hyperkeratosis of sole 08/01/2023   CIDP (chronic inflammatory demyelinating polyneuropathy) (HCC) 01/28/2023   Gait abnormality 01/28/2023   Glaucoma 01/28/2023  Pain due to onychomycosis of toenails of both feet 01/21/2023   Wheelchair dependence 12/30/2022   Neuropathic pain 12/30/2022   Insomnia due to medical condition 12/30/2022   Acute inflammatory demyelinating polyneuropathy (HCC) 10/01/2022   UTI (urinary tract infection) 09/24/2022   Hypomagnesemia 09/24/2022   Weakness 09/23/2022   Hypokalemia 09/23/2022   B12 deficiency 09/05/2022   Folate deficiency 09/05/2022   Carpal tunnel syndrome, left upper limb 05/11/2019    Class: Chronic   Spinal stenosis of lumbar region with neurogenic claudication 08/20/2018   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   Primary osteoarthritis of right knee 02/27/2017   Right tennis elbow 11/11/2016   Muscle cramps  01/15/2016   Bilateral leg edema 01/08/2016   Radiculopathy 12/20/2015   Hyperglycemia 12/21/2014   Mastodynia, female 11/30/2014   S/P excision of fibroadenoma of breast 11/30/2014   Angioedema of lips 04/07/2014   Fibroadenoma of right breast 03/07/2014   Pelvic pain in female 06/19/2012   Menorrhagia 06/11/2012   SUI (stress urinary incontinence, female) 06/11/2012   HTN (hypertension) 06/11/2012   IBS (irritable bowel syndrome) - diarrhea predominant 03/17/2012   MICROSCOPIC HEMATURIA 11/30/2010   BREAST PAIN, RIGHT 11/30/2010   BREAST MASS, RIGHT 01/12/2010   HYPERCHOLESTEROLEMIA 12/07/2007   CIGARETTE SMOKER 12/07/2007   Osteoarthritis 12/07/2007   Low back pain with sciatica 12/07/2007   Headache 12/07/2007   Obesity, Class III, BMI 40-49.9 (morbid obesity) (HCC) 08/03/2007   Anxiety state 08/03/2007    ONSET DATE: 12/05/22  REFERRING DIAG:  G89.4 (ICD-10-CM) - Chronic pain syndrome    THERAPY DIAG:  CIDP (chronic inflammatory demyelinating polyneuropathy) (HCC)  Other abnormalities of gait and mobility  Difficulty in walking, not elsewhere classified  Muscle weakness (generalized)  Rationale for Evaluation and Treatment: Rehabilitation  SUBJECTIVE:                                                                                                                                                                                             SUBJECTIVE STATEMENT: I am doing alright. I went down my ramp by myself holding on to the railing.    PERTINENT HISTORY: Pt Continuation for PT due to chronic inflammatory demyelinating polyneuropathy   01/28/23 ASSESSMENT AND PLAN   Dana [name] is a 58 y.o. female   Demyelinating polyradiculoneuropathy             Symptom onset monophasic since September 2024, failed to respond to IVIG in January 2024,             EMG nerve conduction study showed demyelinating features, no evidence of axonal loss,  Talk with  neurohospitalist Dr. Cleone Dad, ED triage, will send her for hospital admission for plasma exchange, please add on following labs, immunofixative protein electrophoresis, ANA with reflex, SSA, SSB titer, iron panel             Starting outpatient IVIG prior authorization process,   ANAHLIA ISEMINGER is a 58 y.o. female who presented to the [facility] ED on 09/23/2022 with bilateral lower extremity weakness with decreased mobility that has progressed to both arms. She also reported numbness and tingling of the extremities. She was transferred to [facility] for MRI. 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  Inpatient Rehab F/B HHPT.   R knee injection 10/08/22 trigger point injections for worsening back pain 2/15  RA  PAIN:  Are you having pain? No  PRECAUTIONS: None  RED FLAGS: None   WEIGHT BEARING RESTRICTIONS: No  FALLS: Has patient fallen in last 6 months? No  LIVING ENVIRONMENT: Lives with: lives with their family Lives in: House/apartment Stairs: No Has following equipment at home: Environmental consultant - 2 wheeled and Wheelchair (power)  PLOF: Independent and Independent with basic ADLs  PATIENT GOALS: I want to get out this chair and put it down in the basement   OBJECTIVE:  Note: Objective measures were completed at Evaluation unless otherwise noted.  DIAGNOSTIC FINDINGS: Right knee radiographs 11/09/2020   FINDINGS: Severe patellofemoral joint space narrowing. Severe superior and mild inferior patellar degenerative osteophytosis. Moderate superior trochlear degenerative osteophytosis. No joint effusion. Severe medial compartment joint space narrowing with moderate peripheral medial and lateral compartment degenerative osteophytes.   COGNITION: Overall cognitive status: Within functional limits for tasks assessed   SENSATION: Light touch: still has some numbness in L thigh down into the knee, toes are also numb a little bit   EDEMA: some BLE edema   POSTURE: rounded  shoulders and forward head  LOWER EXTREMITY ROM:  grossly WFL for her means, able to move better than last time she was in PT    LOWER EXTREMITY MMT:    MMT Right Eval Left Eval  Hip flexion 4 4  Hip extension    Hip abduction 4 4  Hip adduction 4 4  Hip internal rotation    Hip external rotation    Knee flexion 4 4-  Knee extension 4- 4-  Ankle dorsiflexion    Ankle plantarflexion    Ankle inversion    Ankle eversion    (Blank rows = not tested)   TRANSFERS: Assistive device utilized: Environmental consultant - 2 wheeled and Wheelchair (power)  Sit to stand: Modified independence Stand to sit: Complete Independence Chair to chair: Modified independence  GAIT: Gait pattern: step through pattern, decreased stride length, wide BOS, poor foot clearance- Right, and poor foot clearance- Left Distance walked: in clinic distances Assistive device utilized: Environmental consultant - 2 wheeled Level of assistance: Modified independence Comments: she was able to take a few steps without the walker at eval  FUNCTIONAL TESTS:  5 times sit to stand: 13.14s from elevated mat table  Timed up and go (TUG): 29s with walker  TREATMENT DATE:  01/08/24 Taking steps in parallel bars without UE use- forwards, backwards, side steps  Calf raises by steps 2x10  Box taps 6" holding on to railings  Walk across steps 2HRA but can alternate feet  NuStep L5x63mins    12/30/23 NuStep L5x57mins  Standing marching with RW Standing hip abd with RW 2x10 STS 2x10 Walking laps 171ft, 156ft    12/16/23- EVAL    PATIENT EDUCATION: Education details: POC, aquatic therapy  Person educated: Patient Education method: Explanation Education comprehension: verbalized understanding  HOME EXERCISE PROGRAM: [id number]   GOALS: Goals reviewed with patient? Yes  SHORT TERM GOALS: Target date: 01/20/24  Patient  will be independent with initial HEP. Baseline:  Goal status: INITIAL  2.  Patient will demonstrate improved functional LE strength as demonstrated by 5xSTS from low table <12s. Baseline: 13.14s from elevated mat table  Goal status: INITIAL   LONG TERM GOALS: Target date: 02/24/24  Patient will be independent with advanced/ongoing HEP to improve outcomes and carryover.  Baseline:  Goal status: INITIAL  2.  Patient will be able to ambulate 500' with LRAD with good safety to access community.  Baseline: can walk with RW Goal status: INITIAL  3.  Patient will demonstrate decreased fall risk by scoring < 15 sec on TUG. Baseline: 29s Goal status: INITIAL  4.  Patient will be able to walk 69ft or more with no AD Baseline: took 10 steps modI  Goal status: INITIAL  5. Patient will increase standing tolerance to 30 mins or longer to complete household and kitchen chores  Baseline: before needing to sit  Goal status: INITIAL   ASSESSMENT:  CLINICAL IMPRESSION: We tried some pregait activities and started taking some steps without any UE use. She begins with really small steps but with cues is able to take some larger steps. Still doing quick steps with decreased stride length due to numbness in LLE. Was able to go up and down staircase alternating steps using bilateral hand rails. Some fear with attempting box taps without holding on.     Patient is a 58 y.o. 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 She is currently still getting around in her power chair for the most part. She reports using the walker in her home, using it to get to the bathroom and in the kitchen. States her standing tolerance is about . Patient was able to take a few steps today without an assistive device. She has made great improvements from the last time she was at our clinic. She expresses  interest in doing aquatic therapy. We will set her up to do 1x land based and 1x a week aquatic therapy to be able to increase her standing and walking tolerance with and without an AD to improve her functional independence.   OBJECTIVE IMPAIRMENTS: Abnormal gait, decreased activity tolerance, decreased balance, decreased coordination, decreased endurance, difficulty walking, decreased ROM, decreased strength, postural dysfunction, and obesity.   ACTIVITY LIMITATIONS: carrying, lifting, squatting, stairs, transfers, and locomotion level  PARTICIPATION LIMITATIONS: cleaning, laundry, driving, shopping, and community activity  PERSONAL FACTORS: Fitness, Past/current experiences, Time since onset of injury/illness/exacerbation, and 1-2 comorbidities: chronic pain, GBS  are also affecting patient's functional outcome.   REHAB POTENTIAL: Good  CLINICAL DECISION MAKING: Stable/uncomplicated  EVALUATION COMPLEXITY: Low  PLAN:  PT FREQUENCY: 1-2x/week  PT DURATION: 10 weeks  PLANNED INTERVENTIONS: 97110-Therapeutic exercises, 97530- Therapeutic activity, [id number]- Neuromuscular re-education, 97535-  Self Care, 40981- Manual therapy, [phone]- Gait training, [phone]- Aquatic Therapy, Patient/Family education, Balance training, and Stair training  PLAN FOR NEXT SESSION: start with any functional tasks and working on gait and balance. Designer, fashion/clothing pushes  STS with chest press    [employer], PT 01/08/2024, 11:44 AM

## 2024-01-08 ENCOUNTER — Ambulatory Visit

## 2024-01-08 DIAGNOSIS — G6181 Chronic inflammatory demyelinating polyneuritis: Secondary | ICD-10-CM | POA: Diagnosis not present

## 2024-01-08 DIAGNOSIS — M6281 Muscle weakness (generalized): Secondary | ICD-10-CM

## 2024-01-08 DIAGNOSIS — R2689 Other abnormalities of gait and mobility: Secondary | ICD-10-CM

## 2024-01-08 DIAGNOSIS — R262 Difficulty in walking, not elsewhere classified: Secondary | ICD-10-CM

## 2024-01-12 ENCOUNTER — Ambulatory Visit (HOSPITAL_BASED_OUTPATIENT_CLINIC_OR_DEPARTMENT_OTHER): Admitting: Physical Therapy

## 2024-01-12 ENCOUNTER — Telehealth: Payer: Self-pay | Admitting: Surgical

## 2024-01-12 NOTE — Telephone Encounter (Signed)
 Patient called. Would like gel injections in her knees. Says she has auth.

## 2024-01-13 ENCOUNTER — Encounter (HOSPITAL_BASED_OUTPATIENT_CLINIC_OR_DEPARTMENT_OTHER): Payer: Self-pay

## 2024-01-13 ENCOUNTER — Ambulatory Visit (HOSPITAL_BASED_OUTPATIENT_CLINIC_OR_DEPARTMENT_OTHER): Admitting: Physical Therapy

## 2024-01-15 ENCOUNTER — Ambulatory Visit

## 2024-01-16 ENCOUNTER — Telehealth: Payer: Self-pay

## 2024-01-16 ENCOUNTER — Telehealth: Payer: Self-pay | Admitting: Family Medicine

## 2024-01-16 ENCOUNTER — Other Ambulatory Visit: Payer: Self-pay | Admitting: Family Medicine

## 2024-01-16 ENCOUNTER — Other Ambulatory Visit: Payer: Self-pay | Admitting: Physical Medicine and Rehabilitation

## 2024-01-16 ENCOUNTER — Ambulatory Visit: Payer: Self-pay

## 2024-01-16 NOTE — Telephone Encounter (Signed)
 Received in after-hours call that patient was requesting pain medication refill.  Patient's chart was reviewed and patient was called back to discuss the issue.  Unfortunately, patient did not answer the phone so a voicemail was left stating that patient could call back to discuss further or follow-up with her primary care provider or pain specialist for refill.  Advised that if patient is having any concerning symptoms or worsening pain, to seek immediate medical attention either at urgent care or at the emergency department.

## 2024-01-16 NOTE — Telephone Encounter (Signed)
 Copied from CRM [id number]. Topic: Clinical - Red Word Triage >> Jan 16, 2024  5:24 PM Clyde Darling P wrote: Red Word that prompted transfer to Nurse Triage: pt requesting to speak to on call pcp Reason for Disposition  [1] Pharmacy calling with prescription questions AND [2] triager unable to answer question  Answer Assessment - Initial Assessment Questions 1. NAME of MEDICINE: "What medicine(s) are you calling about?"     HYDROmorphone  (DILAUDID ) 8 MG tablet 2. QUESTION: "What is your question?" (e.g., double dose of medicine, side effect)      Pt needs refill. Pt reports  3. PRESCRIBER: "Who prescribed the medicine?" Reason: if prescribed by specialist, call should be referred to that group.      Donley Furth, MD 4. SYMPTOMS: "Do you have any symptoms?" If Yes, ask: "What symptoms are you having?"  "How bad are the symptoms (e.g., mild, moderate, severe)     Pt reports pain everywhere. Pt reports having hx of back pain and guillain barre syndrome.  Pt reports trying to talk to Dr. Alyne Babinski, pt reports needing pain medication and requesting triager call on-call. Triager attempted to explain that on-call MDs typically do not prescribe controlled medication, and if she needed immediate pain relief, the best option is for her to go to ED/UC.  Protocols used: Medication Question Call-A-AH, Medication Refill and Renewal Call-A-AH

## 2024-01-16 NOTE — Telephone Encounter (Signed)
 Copied from CRM [id number]. Topic: Clinical - Prescription Issue >> Jan 16, 2024 12:54 PM Marlan Silva wrote: Reason for CRM: Patient called in stating that she would like to speak to nurse Haskell Linker. Patient states that she called the pharmacy to call in a refill on her pain medication, and they said that Dr. Alyne Babinski did not send the correct prescription over to the pharmacy. The pharmacist said that Dr. Alyne Babinski did not fill the rx out correctly that he was supposed to send 1 prescription at a time versus sending 90 days and they will not fill it until corrected. Patient is asking for a call back with an update in regards to this matter and is requesting to speak with Haskell Linker call back number is [phone].

## 2024-01-16 NOTE — Telephone Encounter (Signed)
 Copied from CRM [id number]. Topic: General - Call Back - No Documentation >> Jan 16, 2024  4:02 PM Marlan Silva wrote: Reason for CRM: Patient called requesting to speak with Haskell Linker. Called [facility], Haskell Linker said she will call back once she gets off the phone with a previous patient. >> Jan 16, 2024  5:13 PM Bridgette Campus T wrote: Patient calling back, did not hear from [place] and now office closed. Patient very upset.

## 2024-01-16 NOTE — Telephone Encounter (Signed)
 Spoke with pt advised that Dr Alyne Babinski has left for the weekend. Pt request for a refill for her Hydromorphone 

## 2024-01-19 ENCOUNTER — Telehealth: Payer: Self-pay

## 2024-01-19 NOTE — Telephone Encounter (Signed)
 Copied from CRM [id number]. Topic: General - Other >> Jan 19, 2024  8:22 AM Howard Macho wrote: Reason for CRM: patient would like the nurse to call her concerning her pain medication because her pharmacy does not have it. [facility]. [facility] in [place] on [phone]

## 2024-01-19 NOTE — Telephone Encounter (Signed)
 Spoke with pt pharmacy stated that pt picked up Hydromorphone  Rx on 12/19/23. States that they need a refill sent since it due for month of May. Please advise

## 2024-01-19 NOTE — Telephone Encounter (Signed)
 See her other message

## 2024-01-19 NOTE — Telephone Encounter (Signed)
 What are they talking about? I ordered it for 4 times a day as needed, and 30 days of that is 120 (not 90 days)

## 2024-01-19 NOTE — Telephone Encounter (Signed)
 Pt last PMV was 12/16/23 Last refill for Hydromorphone  was done on 12/16/23 Please advise

## 2024-01-19 NOTE — Therapy (Signed)
 OUTPATIENT PHYSICAL THERAPY NEURO TREATMENT   Patient Name: Dana [name] MRN: [id number] DOB:Dec 18, 1965, 58 y.o., female Today's Date: 01/20/2024   PCP: Dana [name] REFERRING PROVIDER: Corita [name]  END OF SESSION:  PT End of Session - 01/20/24 1059     Visit Number 4    Date for PT Re-Evaluation 02/24/24    Authorization Type [employer]    PT Start Time 1100    PT Stop Time 1145    PT Time Calculation (min) 45 min                Past Medical History:  Diagnosis Date   Anxiety    Back pain with radiation    Breast mass, right    Chronic pain    Chronic, continuous use of opioids    Complication of anesthesia 04/07/14   Allergic reaction to Lisinopril  immediately following surgery   DJD (degenerative joint disease)    Fibromyalgia    Groin abscess    Headache(784.0)    History of IBS    Hypercholesterolemia    IBS (irritable bowel syndrome)    Lactose intolerance    Mild hypertension    Obesity    Tobacco use disorder    Umbilical hernia    Symptomatic   Past Surgical History:  Procedure Laterality Date   ABDOMINAL HYSTERECTOMY     ANTERIOR CERVICAL DECOMP/DISCECTOMY FUSION N/A 12/20/2015   Procedure: ANTERIOR CERVICAL DECOMPRESSION FUSION CERVICAL 4-5, CERVICAL 5-6, CERVICAL 6-7 WITH INSTRUMENTATION AND ALLOGRAFT;  Surgeon: Virl Grimes, MD;  Location: [facility] OR;  Service: Orthopedics;  Laterality: N/A;  Anterior cervical decompression fusion, cervical 4-5, cervical 5-6, cervical 6-7 with instrumentation and allograft   BACK SURGERY     BILATERAL SALPINGECTOMY  09/03/2012   Procedure: BILATERAL SALPINGECTOMY;  Surgeon: Dana Erps, MD;  Location: [facility];  Service: Gynecology;  Laterality: Bilateral;   BREAST BIOPSY Right 04/07/2014   Procedure: REMOVAL RIGHT BREAST MASS WITH WIRE LOCALIZATION;  Surgeon: Dana Lichtenstein, MD;  Location: [facility] OR;  Service: General;  Laterality: Right;   BREAST EXCISIONAL BIOPSY Left    x2   BREAST LUMPECTOMY     x2   CARPAL  TUNNEL RELEASE Left 05/11/2019   Procedure: LEFT CARPAL TUNNEL RELEASE, RIGHT TENNIS ELBOW MARCAINE /DEPO MEDROL  INJECTION UNDER ANESTHESIA;  Surgeon: Dana Jean, MD;  Location: [facility] OR;  Service: Orthopedics;  Laterality: Left;   COLONOSCOPY W/ BIOPSIES  04/24/2012   per Dr. Willy [name], clear, repeat in 10 yrs    disectomy     ESOPHAGOGASTRODUODENOSCOPY     FINGER SURGERY     Right index-excision of mass    FOOT SURGERY Right    Bone Spurs   IR FLUORO GUIDE CV LINE RIGHT  01/29/2023   IR REMOVAL TUN CV CATH W/O FL  02/08/2023   IR US  GUIDE VASC ACCESS RIGHT  01/31/2023   LAPAROSCOPIC HYSTERECTOMY  09/03/2012   Procedure: HYSTERECTOMY TOTAL LAPAROSCOPIC;  Surgeon: Dana Erps, MD;  Location: [facility];  Service: Gynecology;  Laterality: N/A;   LUMBAR DISC SURGERY     TUBAL LIGATION     UMBILICAL HERNIA REPAIR N/A 05/01/2018   Procedure: UMBILICAL HERNIA REPAIR;  Surgeon: Dana Morin, MD;  [facility];  Service: General;  Laterality: N/A;   Patient Active Problem List   Diagnosis Date Noted   Edema 11/20/2023   Hyperkeratosis of sole 08/01/2023   CIDP (chronic inflammatory demyelinating polyneuropathy) (HCC) 01/28/2023   Gait abnormality 01/28/2023   Glaucoma 01/28/2023  Pain due to onychomycosis of toenails of both feet 01/21/2023   Wheelchair dependence 12/30/2022   Neuropathic pain 12/30/2022   Insomnia due to medical condition 12/30/2022   Acute inflammatory demyelinating polyneuropathy (HCC) 10/01/2022   UTI (urinary tract infection) 09/24/2022   Hypomagnesemia 09/24/2022   Weakness 09/23/2022   Hypokalemia 09/23/2022   B12 deficiency 09/05/2022   Folate deficiency 09/05/2022   Carpal tunnel syndrome, left upper limb 05/11/2019    Class: Chronic   Spinal stenosis of lumbar region with neurogenic claudication 08/20/2018   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   Primary osteoarthritis of right knee 02/27/2017   Right tennis elbow 11/11/2016   Muscle cramps  01/15/2016   Bilateral leg edema 01/08/2016   Radiculopathy 12/20/2015   Hyperglycemia 12/21/2014   Mastodynia, female 11/30/2014   S/P excision of fibroadenoma of breast 11/30/2014   Angioedema of lips 04/07/2014   Fibroadenoma of right breast 03/07/2014   Pelvic pain in female 06/19/2012   Menorrhagia 06/11/2012   SUI (stress urinary incontinence, female) 06/11/2012   HTN (hypertension) 06/11/2012   IBS (irritable bowel syndrome) - diarrhea predominant 03/17/2012   MICROSCOPIC HEMATURIA 11/30/2010   BREAST PAIN, RIGHT 11/30/2010   BREAST MASS, RIGHT 01/12/2010   HYPERCHOLESTEROLEMIA 12/07/2007   CIGARETTE SMOKER 12/07/2007   Osteoarthritis 12/07/2007   Low back pain with sciatica 12/07/2007   Headache 12/07/2007   Obesity, Class III, BMI 40-49.9 (morbid obesity) 08/03/2007   Anxiety state 08/03/2007    ONSET DATE: 12/05/22  REFERRING DIAG:  G89.4 (ICD-10-CM) - Chronic pain syndrome    THERAPY DIAG:  CIDP (chronic inflammatory demyelinating polyneuropathy) (HCC)  Other abnormalities of gait and mobility  Guillain Barr syndrome (HCC)  Difficulty in walking, not elsewhere classified  Muscle weakness (generalized)  Unsteadiness on feet  Other lack of coordination  Rationale for Evaluation and Treatment: Rehabilitation  SUBJECTIVE:                                                                                                                                                                                             SUBJECTIVE STATEMENT: Saturday I went out to [employer] and I was using my walker. It was pouring down rain, and I think I messed something up when trying to come up the curb. I felt a shooting pain down my R side. I have been messed up since.    PERTINENT HISTORY: Pt Continuation for PT due to chronic inflammatory demyelinating polyneuropathy   01/28/23 ASSESSMENT AND PLAN   Dana [name] is a 58 y.o. female   Demyelinating  polyradiculoneuropathy  Symptom onset monophasic since September 2024, failed to respond to IVIG in January 2024,             EMG nerve conduction study showed demyelinating features, no evidence of axonal loss,             Talk with neurohospitalist Dr. Cleone Dad, ED triage, will send her for hospital admission for plasma exchange, please add on following labs, immunofixative protein electrophoresis, ANA with reflex, SSA, SSB titer, iron panel             Starting outpatient IVIG prior authorization process,   MIRREN LOYOLA is a 58 y.o. female who presented to the [facility] ED on 09/23/2022 with bilateral lower extremity weakness with decreased mobility that has progressed to both arms. She also reported numbness and tingling of the extremities. She was transferred to [facility] for MRI. 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  Inpatient Rehab F/B HHPT.   R knee injection 10/08/22 trigger point injections for worsening back pain 2/15  RA  PAIN:  Are you having pain? No  PRECAUTIONS: None  RED FLAGS: None   WEIGHT BEARING RESTRICTIONS: No  FALLS: Has patient fallen in last 6 months? No  LIVING ENVIRONMENT: Lives with: lives with their family Lives in: House/apartment Stairs: No Has following equipment at home: Environmental consultant - 2 wheeled and Wheelchair (power)  PLOF: Independent and Independent with basic ADLs  PATIENT GOALS: I want to get out this chair and put it down in the basement   OBJECTIVE:  Note: Objective measures were completed at Evaluation unless otherwise noted.  DIAGNOSTIC FINDINGS: Right knee radiographs 11/09/2020   FINDINGS: Severe patellofemoral joint space narrowing. Severe superior and mild inferior patellar degenerative osteophytosis. Moderate superior trochlear degenerative osteophytosis. No joint effusion. Severe medial compartment joint space narrowing with moderate peripheral medial and lateral compartment degenerative osteophytes.    COGNITION: Overall cognitive status: Within functional limits for tasks assessed   SENSATION: Light touch: still has some numbness in L thigh down into the knee, toes are also numb a little bit   EDEMA: some BLE edema   POSTURE: rounded shoulders and forward head  LOWER EXTREMITY ROM:  grossly WFL for her means, able to move better than last time she was in PT    LOWER EXTREMITY MMT:    MMT Right Eval Left Eval  Hip flexion 4 4  Hip extension    Hip abduction 4 4  Hip adduction 4 4  Hip internal rotation    Hip external rotation    Knee flexion 4 4-  Knee extension 4- 4-  Ankle dorsiflexion    Ankle plantarflexion    Ankle inversion    Ankle eversion    (Blank rows = not tested)   TRANSFERS: Assistive device utilized: Environmental consultant - 2 wheeled and Wheelchair (power)  Sit to stand: Modified independence Stand to sit: Complete Independence Chair to chair: Modified independence  GAIT: Gait pattern: step through pattern, decreased stride length, wide BOS, poor foot clearance- Right, and poor foot clearance- Left Distance walked: in clinic distances Assistive device utilized: Environmental consultant - 2 wheeled Level of assistance: Modified independence Comments: she was able to take a few steps without the walker at eval  FUNCTIONAL TESTS:  5 times sit to stand: 13.14s from elevated mat table  Timed up and go (TUG): 29s with walker  TREATMENT DATE:  01/20/24 Recheck 5xSTS goal Fitter pushes 2x10 STS with chest press 2# 2x8 Walking ~5ft from table to NuStep w/o AD NuStep L5x49mins Taps to side of treadmill 20 reps alt  Walk from treadmill back to mat table without AD   01/08/24 Taking steps in parallel bars without UE use- forwards, backwards, side steps  Calf raises by steps 2x10  Box taps 6" holding on to railings  Walk across steps 2HRA but can alternate  feet  NuStep L5x5mins    12/30/23 NuStep L5x7mins  Standing marching with RW Standing hip abd with RW 2x10 STS 2x10 Walking laps 13ft, 166ft    12/16/23- EVAL    PATIENT EDUCATION: Education details: POC, aquatic therapy  Person educated: Patient Education method: Explanation Education comprehension: verbalized understanding  HOME EXERCISE PROGRAM: [id number]   GOALS: Goals reviewed with patient? Yes  SHORT TERM GOALS: Target date: 01/20/24  Patient will be independent with initial HEP. Baseline:  Goal status: INITIAL  2.  Patient will demonstrate improved functional LE strength as demonstrated by 5xSTS <12s. Baseline: 13.14s from elevated mat table  Goal status: 8.75s from elevated table MET 01/20/24   LONG TERM GOALS: Target date: 02/24/24  Patient will be independent with advanced/ongoing HEP to improve outcomes and carryover.  Baseline:  Goal status: INITIAL  2.  Patient will be able to ambulate 500' with LRAD with good safety to access community.  Baseline: can walk with RW Goal status: INITIAL  3.  Patient will demonstrate decreased fall risk by scoring < 15 sec on TUG. Baseline: 29s Goal status: INITIAL  4.  Patient will be able to walk 57ft or more with no AD Baseline: took 10 steps modI  Goal status: IN PROGRESS did 86ft 01/20/24   5. Patient will increase standing tolerance to 30 mins or longer to complete household and kitchen chores  Baseline: before needing to sit  Goal status: INITIAL   ASSESSMENT:  CLINICAL IMPRESSION: Patient feels she hurt her R knee over the weekend. Today we continued with functional movements and LE strengthening. She has met her STGs. Pt was able to walk about 59ft without AD 2 times, with close SBA. Small steps and tries to move quickly to get to her spot quickly but this was her first time going this far without the walker.     Patient is a 58 y.o. female who was seen today for physical therapy evaluation and  treatment for chronic inflammatory demyelinating polyneuropathy and Guillain-Burre syndrome. She is currently still getting around in her power chair for the most part. She reports using the walker in her home, using it to get to the bathroom and in the kitchen. States her standing tolerance is about . Patient was able to take a few steps today without an assistive device. She has made great improvements from the last time she was at our clinic. She expresses interest in doing aquatic therapy. We will set her up to do 1x land based and 1x a week aquatic therapy to be able to increase her standing and walking tolerance with and without an AD to improve her functional independence.   OBJECTIVE IMPAIRMENTS: Abnormal gait, decreased activity tolerance, decreased balance, decreased coordination, decreased endurance, difficulty walking, decreased ROM, decreased strength, postural dysfunction, and obesity.   ACTIVITY LIMITATIONS: carrying, lifting, squatting, stairs, transfers, and locomotion level  PARTICIPATION LIMITATIONS: cleaning, laundry, driving, shopping, and community activity  PERSONAL FACTORS: Fitness, Past/current experiences, Time since onset of injury/illness/exacerbation, and 1-2 comorbidities:  chronic pain, GBS  are also affecting patient's functional outcome.   REHAB POTENTIAL: Good  CLINICAL DECISION MAKING: Stable/uncomplicated  EVALUATION COMPLEXITY: Low  PLAN:  PT FREQUENCY: 1-2x/week  PT DURATION: 10 weeks  PLANNED INTERVENTIONS: 97110-Therapeutic exercises, 97530- Therapeutic activity, [id number]- Neuromuscular re-education, 97535- Self Care, [id number]- Manual therapy, [phone]- Gait training, [phone]- Aquatic Therapy, Patient/Family education, Balance training, and Stair training  PLAN FOR NEXT SESSION: start with any functional tasks and working on gait and balance. Endurance training   [employer], PT 01/20/2024, 11:43 AM

## 2024-01-20 ENCOUNTER — Ambulatory Visit

## 2024-01-20 ENCOUNTER — Ambulatory Visit: Attending: Family Medicine

## 2024-01-20 DIAGNOSIS — M6281 Muscle weakness (generalized): Secondary | ICD-10-CM | POA: Diagnosis not present

## 2024-01-20 DIAGNOSIS — G610 Guillain-Barre syndrome: Secondary | ICD-10-CM | POA: Diagnosis not present

## 2024-01-20 DIAGNOSIS — R278 Other lack of coordination: Secondary | ICD-10-CM

## 2024-01-20 DIAGNOSIS — R2681 Unsteadiness on feet: Secondary | ICD-10-CM

## 2024-01-20 DIAGNOSIS — R262 Difficulty in walking, not elsewhere classified: Secondary | ICD-10-CM

## 2024-01-20 DIAGNOSIS — R2689 Other abnormalities of gait and mobility: Secondary | ICD-10-CM | POA: Diagnosis not present

## 2024-01-20 DIAGNOSIS — G6181 Chronic inflammatory demyelinating polyneuritis: Secondary | ICD-10-CM

## 2024-01-20 MED ORDER — HYDROMORPHONE HCL 8 MG PO TABS: 8.0000 mg | ORAL_TABLET | Freq: Four times a day (QID) | ORAL | 0 refills | Status: DC | PRN

## 2024-01-20 NOTE — Telephone Encounter (Signed)
 Spoke with pt advised that Dr Alyne Babinski will send her pain medication to her pharmacy, left a message for pt advised to pick up Rx from pharmacy

## 2024-01-20 NOTE — Telephone Encounter (Signed)
 Spoke with pt advised to call her pharmacy for her pain medication pick up, pt verbalized understading

## 2024-01-20 NOTE — Telephone Encounter (Signed)
 I sent in refills for 2 months

## 2024-01-20 NOTE — Addendum Note (Signed)
 Addended by: Corita Diego A on: 01/20/2024 08:21 AM   Modules accepted: Orders

## 2024-01-20 NOTE — Telephone Encounter (Signed)
 Left pt a detailed message advised to contact her pharmacy for refill pick up.

## 2024-01-21 ENCOUNTER — Other Ambulatory Visit: Payer: Self-pay

## 2024-01-21 DIAGNOSIS — M1712 Unilateral primary osteoarthritis, left knee: Secondary | ICD-10-CM

## 2024-01-21 DIAGNOSIS — M1711 Unilateral primary osteoarthritis, right knee: Secondary | ICD-10-CM

## 2024-01-21 NOTE — Telephone Encounter (Signed)
 Talked with patient and appointment has been scheduled.

## 2024-01-27 ENCOUNTER — Ambulatory Visit (HOSPITAL_BASED_OUTPATIENT_CLINIC_OR_DEPARTMENT_OTHER): Admitting: Physical Therapy

## 2024-01-27 ENCOUNTER — Encounter: Payer: Self-pay | Admitting: Rehabilitation

## 2024-01-27 ENCOUNTER — Ambulatory Visit: Admitting: Rehabilitation

## 2024-01-27 NOTE — Therapy (Deleted)
 OUTPATIENT PHYSICAL THERAPY NEURO TREATMENT   Patient Name: Dana [name] MRN: [id number] DOB:19-Nov-1965, 58 y.o., female Today's Date: 01/27/2024   PCP: Corita Diego REFERRING PROVIDER: Corita Diego  END OF SESSION:  PT End of Session - 01/27/24 0751     Visit Number 5    Date for PT Re-Evaluation 02/24/24    Authorization Type [employer]    Equipment Utilized During Treatment Other (comment)   floatation devices as needed for safety   Activity Tolerance Patient tolerated treatment well    Behavior During Therapy [facility] for tasks assessed/performed                Past Medical History:  Diagnosis Date   Anxiety    Back pain with radiation    Breast mass, right    Chronic pain    Chronic, continuous use of opioids    Complication of anesthesia 04/07/14   Allergic reaction to Lisinopril  immediately following surgery   DJD (degenerative joint disease)    Fibromyalgia    Groin abscess    Headache(784.0)    History of IBS    Hypercholesterolemia    IBS (irritable bowel syndrome)    Lactose intolerance    Mild hypertension    Obesity    Tobacco use disorder    Umbilical hernia    Symptomatic   Past Surgical History:  Procedure Laterality Date   ABDOMINAL HYSTERECTOMY     ANTERIOR CERVICAL DECOMP/DISCECTOMY FUSION N/A 12/20/2015   Procedure: ANTERIOR CERVICAL DECOMPRESSION FUSION CERVICAL 4-5, CERVICAL 5-6, CERVICAL 6-7 WITH INSTRUMENTATION AND ALLOGRAFT;  Surgeon: Virl Grimes, MD;  Location: [facility] OR;  Service: Orthopedics;  Laterality: N/A;  Anterior cervical decompression fusion, cervical 4-5, cervical 5-6, cervical 6-7 with instrumentation and allograft   BACK SURGERY     BILATERAL SALPINGECTOMY  09/03/2012   Procedure: BILATERAL SALPINGECTOMY;  Surgeon: Davia Erps, MD;  Location: [facility] ORS;  Service: Gynecology;  Laterality: Bilateral;   BREAST BIOPSY Right 04/07/2014   Procedure: REMOVAL RIGHT BREAST MASS WITH WIRE LOCALIZATION;  Surgeon: Harlee Lichtenstein, MD;   Location: [facility] OR;  Service: General;  Laterality: Right;   BREAST EXCISIONAL BIOPSY Left    x2   BREAST LUMPECTOMY     x2   CARPAL TUNNEL RELEASE Left 05/11/2019   Procedure: LEFT CARPAL TUNNEL RELEASE, RIGHT TENNIS ELBOW MARCAINE /DEPO MEDROL  INJECTION UNDER ANESTHESIA;  Surgeon: Alphonso Jean, MD;  Location: [facility] OR;  Service: Orthopedics;  Laterality: Left;   COLONOSCOPY W/ BIOPSIES  04/24/2012   per Dr. Willy Harvest, clear, repeat in 10 yrs    disectomy     ESOPHAGOGASTRODUODENOSCOPY     FINGER SURGERY     Right index-excision of mass    FOOT SURGERY Right    Bone Spurs   IR FLUORO GUIDE CV LINE RIGHT  01/29/2023   IR REMOVAL TUN CV CATH W/O FL  02/08/2023   IR US  GUIDE VASC ACCESS RIGHT  01/31/2023   LAPAROSCOPIC HYSTERECTOMY  09/03/2012   Procedure: HYSTERECTOMY TOTAL LAPAROSCOPIC;  Surgeon: Davia Erps, MD;  Location: [facility];  Service: Gynecology;  Laterality: N/A;   LUMBAR DISC SURGERY     TUBAL LIGATION     UMBILICAL HERNIA REPAIR N/A 05/01/2018   Procedure: UMBILICAL HERNIA REPAIR;  Surgeon: Jerryl Morin, MD;  Location: [facility] OR;  Service: General;  Laterality: N/A;   Patient Active Problem List   Diagnosis Date Noted   Edema 11/20/2023   Hyperkeratosis of sole 08/01/2023   CIDP (chronic inflammatory demyelinating  polyneuropathy) (HCC) 01/28/2023   Gait abnormality 01/28/2023   Glaucoma 01/28/2023   Pain due to onychomycosis of toenails of both feet 01/21/2023   Wheelchair dependence 12/30/2022   Neuropathic pain 12/30/2022   Insomnia due to medical condition 12/30/2022   Acute inflammatory demyelinating polyneuropathy (HCC) 10/01/2022   UTI (urinary tract infection) 09/24/2022   Hypomagnesemia 09/24/2022   Weakness 09/23/2022   Hypokalemia 09/23/2022   B12 deficiency 09/05/2022   Folate deficiency 09/05/2022   Carpal tunnel syndrome, left upper limb 05/11/2019    Class: Chronic   Spinal stenosis of lumbar region with neurogenic claudication 08/20/2018   Congenital  deformity of finger 09/12/2017   Primary osteoarthritis of right knee 02/27/2017   Right tennis elbow 11/11/2016   Muscle cramps 01/15/2016   Bilateral leg edema 01/08/2016   Radiculopathy 12/20/2015   Hyperglycemia 12/21/2014   Mastodynia, female 11/30/2014   S/P excision of fibroadenoma of breast 11/30/2014   Angioedema of lips 04/07/2014   Fibroadenoma of right breast 03/07/2014   Pelvic pain in female 06/19/2012   Menorrhagia 06/11/2012   SUI (stress urinary incontinence, female) 06/11/2012   HTN (hypertension) 06/11/2012   IBS (irritable bowel syndrome) - diarrhea predominant 03/17/2012   MICROSCOPIC HEMATURIA 11/30/2010   BREAST PAIN, RIGHT 11/30/2010   BREAST MASS, RIGHT 01/12/2010   HYPERCHOLESTEROLEMIA 12/07/2007   CIGARETTE SMOKER 12/07/2007   Osteoarthritis 12/07/2007   Low back pain with sciatica 12/07/2007   Headache 12/07/2007   Obesity, Class III, BMI 40-49.9 (morbid obesity) 08/03/2007   Anxiety state 08/03/2007    ONSET DATE: 12/05/22  REFERRING DIAG:  G89.4 (ICD-10-CM) - Chronic pain syndrome    THERAPY DIAG:  Other abnormalities of gait and mobility  Difficulty in walking, not elsewhere classified  Muscle weakness (generalized)  Unsteadiness on feet  Rationale for Evaluation and Treatment: Rehabilitation  SUBJECTIVE:                                                                                                                                                                                             SUBJECTIVE STATEMENT: Saturday I went out to [employer] and I was using my walker. It was pouring down rain, and I think I messed something up when trying to come up the curb. I felt a shooting pain down my R side. I have been messed up since.    PERTINENT HISTORY: Pt Continuation for PT due to chronic inflammatory demyelinating polyneuropathy   01/28/23 ASSESSMENT AND PLAN   Dana [name] is a 58 y.o. female   Demyelinating  polyradiculoneuropathy  Symptom onset monophasic since September 2024, failed to respond to IVIG in January 2024,             EMG nerve conduction study showed demyelinating features, no evidence of axonal loss,             Talk with neurohospitalist Dr. Cleone Dad, ED triage, will send her for hospital admission for plasma exchange, please add on following labs, immunofixative protein electrophoresis, ANA with reflex, SSA, SSB titer, iron panel             Starting outpatient IVIG prior authorization process,   Dana [name] is a 58 y.o. female who presented to the [facility] ED on 09/23/2022 with bilateral lower extremity weakness with decreased mobility that has progressed to both arms. She also reported numbness and tingling of the extremities. She was transferred to [facility] for MRI. 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  Inpatient Rehab F/B HHPT.   R knee injection 10/08/22 trigger point injections for worsening back pain 2/15  RA  PAIN:  Are you having pain? No  PRECAUTIONS: None  RED FLAGS: None   WEIGHT BEARING RESTRICTIONS: No  FALLS: Has patient fallen in last 6 months? No  LIVING ENVIRONMENT: Lives with: lives with their family Lives in: House/apartment Stairs: No Has following equipment at home: Environmental consultant - 2 wheeled and Wheelchair (power)  PLOF: Independent and Independent with basic ADLs  PATIENT GOALS: I want to get out this chair and put it down in the basement   OBJECTIVE:  Note: Objective measures were completed at Evaluation unless otherwise noted.  DIAGNOSTIC FINDINGS: Right knee radiographs 11/09/2020   FINDINGS: Severe patellofemoral joint space narrowing. Severe superior and mild inferior patellar degenerative osteophytosis. Moderate superior trochlear degenerative osteophytosis. No joint effusion. Severe medial compartment joint space narrowing with moderate peripheral medial and lateral compartment degenerative osteophytes.    Dana [name]: Overall cognitive status: Within functional limits for tasks assessed   SENSATION: Light touch: still has some numbness in L thigh down into the knee, toes are also numb a little bit   EDEMA: some BLE edema   POSTURE: rounded shoulders and forward head  LOWER EXTREMITY ROM:  grossly WFL for her means, able to move better than last time she was in PT    LOWER EXTREMITY MMT:    MMT Right Eval Left Eval  Hip flexion 4 4  Hip extension    Hip abduction 4 4  Hip adduction 4 4  Hip internal rotation    Hip external rotation    Knee flexion 4 4-  Knee extension 4- 4-  Ankle dorsiflexion    Ankle plantarflexion    Ankle inversion    Ankle eversion    (Blank rows = not tested)   TRANSFERS: Assistive device utilized: Environmental consultant - 2 wheeled and Wheelchair (power)  Sit to stand: Modified independence Stand to sit: Complete Independence Chair to chair: Modified independence  GAIT: Gait pattern: step through pattern, decreased stride length, wide BOS, poor foot clearance- Right, and poor foot clearance- Left Distance walked: in clinic distances Assistive device utilized: Environmental consultant - 2 wheeled Level of assistance: Modified independence Comments: she was able to take a few steps without the walker at eval  FUNCTIONAL TESTS:  5 times sit to stand: 13.14s from elevated mat table  Timed up and go (TUG): 29s with walker  TREATMENT DATE:  01/27/24  Patient seen for aquatic therapy today.  Treatment took place in water  3.6-4.0 feet deep depending upon activity.  Pt entered and exited the pool via chair lift; pt was transferred from her power wheelchair to pool chair lift with S using squat pivot transfer.  Pool temp approx 92 deg.     Pt requires buoyancy of water  for support for reduced fall risk and for unloading/reduced stress on joints (Rt knee)  as pt able to tolerate increased standing and ambulation in water  compared to that on land; viscosity of water  is needed for resistance for strengthening and current of water  provides perturbations for challenge for balance training       PATIENT EDUCATION: Education details: POC, aquatic therapy  Person educated: Patient Education method: Explanation Education comprehension: verbalized understanding  HOME EXERCISE PROGRAM: [id number]   GOALS: Goals reviewed with patient? Yes  SHORT TERM GOALS: Target date: 01/20/24  Patient will be independent with initial HEP. Baseline:  Goal status: INITIAL  2.  Patient will demonstrate improved functional LE strength as demonstrated by 5xSTS <12s. Baseline: 13.14s from elevated mat table  Goal status: 8.75s from elevated table MET 01/20/24   LONG TERM GOALS: Target date: 02/24/24  Patient will be independent with advanced/ongoing HEP to improve outcomes and carryover.  Baseline:  Goal status: INITIAL  2.  Patient will be able to ambulate 500' with LRAD with good safety to access community.  Baseline: can walk with RW Goal status: INITIAL  3.  Patient will demonstrate decreased fall risk by scoring < 15 sec on TUG. Baseline: 29s Goal status: INITIAL  4.  Patient will be able to walk 76ft or more with no AD Baseline: took 10 steps modI  Goal status: IN PROGRESS did 69ft 01/20/24   5. Patient will increase standing tolerance to 30 mins or longer to complete household and kitchen chores  Baseline: before needing to sit  Goal status: INITIAL   ASSESSMENT:  CLINICAL IMPRESSION: Pt presents for first pool session at [facility].  She is able to transfer from power chair to lift chair.     Patient is a 58 y.o. 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 She is currently still getting around in her power chair for the most  part. She reports using the walker in her home, using it to get to the bathroom and in the kitchen. States her standing tolerance is about . Patient was able to take a few steps today without an assistive device. She has made great improvements from the last time she was at our clinic. She expresses interest in doing aquatic therapy. We will set her up to do 1x land based and 1x a week aquatic therapy to be able to increase her standing and walking tolerance with and without an AD to improve her functional independence.   OBJECTIVE IMPAIRMENTS: Abnormal gait, decreased activity tolerance, decreased balance, decreased coordination, decreased endurance, difficulty walking, decreased ROM, decreased strength, postural dysfunction, and obesity.   ACTIVITY LIMITATIONS: carrying, lifting, squatting, stairs, transfers, and locomotion level  PARTICIPATION LIMITATIONS: cleaning, laundry, driving, shopping, and community activity  PERSONAL FACTORS: Fitness, Past/current experiences, Time since onset of injury/illness/exacerbation, and 1-2 comorbidities: chronic pain, GBS are also affecting patient's functional outcome.   REHAB POTENTIAL: Good  CLINICAL DECISION MAKING: Stable/uncomplicated  EVALUATION COMPLEXITY: Low  PLAN:  PT FREQUENCY: 1-2x/week  PT DURATION: 10 weeks  PLANNED INTERVENTIONS: 97110-Therapeutic  exercises, 97530- Therapeutic activity, [id number]- Neuromuscular re-education, [phone]- Self Care, [id number]- Manual therapy, [phone]- Gait training, [phone]- Aquatic Therapy, Patient/Family education, Balance training, and Stair training  PLAN FOR NEXT SESSION: start with any functional tasks and working on gait and balance. Endurance training   Dana [name], PT, MPT [facility] [address] [place], [place], [address] Phone: [phone]   Fax:  [phone] 01/27/24, 7:51 AM

## 2024-01-29 ENCOUNTER — Ambulatory Visit

## 2024-02-03 ENCOUNTER — Ambulatory Visit (HOSPITAL_BASED_OUTPATIENT_CLINIC_OR_DEPARTMENT_OTHER): Admitting: Physical Therapy

## 2024-02-03 ENCOUNTER — Ambulatory Visit: Admitting: Rehabilitation

## 2024-02-04 NOTE — Therapy (Signed)
 OUTPATIENT PHYSICAL THERAPY NEURO TREATMENT   Patient Name: Dana [name] MRN: [id number] DOB:11/08/1965, 58 y.o., female Today's Date: 02/05/2024   PCP: Corita Diego REFERRING PROVIDER: Corita Diego  END OF SESSION:  PT End of Session - 02/05/24 1139     Visit Number 6    Date for PT Re-Evaluation 02/24/24    Authorization Type [employer]    PT Start Time 1145    PT Stop Time 1230    PT Time Calculation (min) 45 min                 Past Medical History:  Diagnosis Date   Anxiety    Back pain with radiation    Breast mass, right    Chronic pain    Chronic, continuous use of opioids    Complication of anesthesia 04/07/14   Allergic reaction to Lisinopril  immediately following surgery   DJD (degenerative joint disease)    Fibromyalgia    Groin abscess    Headache(784.0)    History of IBS    Hypercholesterolemia    IBS (irritable bowel syndrome)    Lactose intolerance    Mild hypertension    Obesity    Tobacco use disorder    Umbilical hernia    Symptomatic   Past Surgical History:  Procedure Laterality Date   ABDOMINAL HYSTERECTOMY     ANTERIOR CERVICAL DECOMP/DISCECTOMY FUSION N/A 12/20/2015   Procedure: ANTERIOR CERVICAL DECOMPRESSION FUSION CERVICAL 4-5, CERVICAL 5-6, CERVICAL 6-7 WITH INSTRUMENTATION AND ALLOGRAFT;  Surgeon: Virl Grimes, MD;  Location: [facility] OR;  Service: Orthopedics;  Laterality: N/A;  Anterior cervical decompression fusion, cervical 4-5, cervical 5-6, cervical 6-7 with instrumentation and allograft   BACK SURGERY     BILATERAL SALPINGECTOMY  09/03/2012   Procedure: BILATERAL SALPINGECTOMY;  Surgeon: Davia Erps, MD;  Location: [facility];  Service: Gynecology;  Laterality: Bilateral;   BREAST BIOPSY Right 04/07/2014   Procedure: REMOVAL RIGHT BREAST MASS WITH WIRE LOCALIZATION;  Surgeon: Harlee Lichtenstein, MD;  Location: [facility] OR;  Service: General;  Laterality: Right;   BREAST EXCISIONAL BIOPSY Left    x2   BREAST LUMPECTOMY     x2   CARPAL  TUNNEL RELEASE Left 05/11/2019   Procedure: LEFT CARPAL TUNNEL RELEASE, RIGHT TENNIS ELBOW MARCAINE /DEPO MEDROL  INJECTION UNDER ANESTHESIA;  Surgeon: Alphonso Jean, MD;  Location: [facility] OR;  Service: Orthopedics;  Laterality: Left;   COLONOSCOPY W/ BIOPSIES  04/24/2012   per Dr. Willy Harvest, clear, repeat in 10 yrs    disectomy     ESOPHAGOGASTRODUODENOSCOPY     FINGER SURGERY     Right index-excision of mass    FOOT SURGERY Right    Bone Spurs   IR FLUORO GUIDE CV LINE RIGHT  01/29/2023   IR REMOVAL TUN CV CATH W/O FL  02/08/2023   IR US  GUIDE VASC ACCESS RIGHT  01/31/2023   LAPAROSCOPIC HYSTERECTOMY  09/03/2012   Procedure: HYSTERECTOMY TOTAL LAPAROSCOPIC;  Surgeon: Davia Erps, MD;  Location: [facility];  Service: Gynecology;  Laterality: N/A;   LUMBAR DISC SURGERY     TUBAL LIGATION     UMBILICAL HERNIA REPAIR N/A 05/01/2018   Procedure: UMBILICAL HERNIA REPAIR;  Surgeon: Jerryl Morin, MD;  Location: [facility] OR;  Service: General;  Laterality: N/A;   Patient Active Problem List   Diagnosis Date Noted   Edema 11/20/2023   Hyperkeratosis of sole 08/01/2023   CIDP (chronic inflammatory demyelinating polyneuropathy) (HCC) 01/28/2023   Gait abnormality 01/28/2023   Glaucoma  01/28/2023   Pain due to onychomycosis of toenails of both feet 01/21/2023   Wheelchair dependence 12/30/2022   Neuropathic pain 12/30/2022   Insomnia due to medical condition 12/30/2022   Acute inflammatory demyelinating polyneuropathy (HCC) 10/01/2022   UTI (urinary tract infection) 09/24/2022   Hypomagnesemia 09/24/2022   Weakness 09/23/2022   Hypokalemia 09/23/2022   B12 deficiency 09/05/2022   Folate deficiency 09/05/2022   Carpal tunnel syndrome, left upper limb 05/11/2019    Class: Chronic   Spinal stenosis of lumbar region with neurogenic claudication 08/20/2018   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   Primary osteoarthritis of right knee 02/27/2017   Right tennis elbow 11/11/2016   Muscle cramps  01/15/2016   Bilateral leg edema 01/08/2016   Radiculopathy 12/20/2015   Hyperglycemia 12/21/2014   Mastodynia, female 11/30/2014   S/P excision of fibroadenoma of breast 11/30/2014   Angioedema of lips 04/07/2014   Fibroadenoma of right breast 03/07/2014   Pelvic pain in female 06/19/2012   Menorrhagia 06/11/2012   SUI (stress urinary incontinence, female) 06/11/2012   HTN (hypertension) 06/11/2012   IBS (irritable bowel syndrome) - diarrhea predominant 03/17/2012   MICROSCOPIC HEMATURIA 11/30/2010   BREAST PAIN, RIGHT 11/30/2010   BREAST MASS, RIGHT 01/12/2010   HYPERCHOLESTEROLEMIA 12/07/2007   CIGARETTE SMOKER 12/07/2007   Osteoarthritis 12/07/2007   Low back pain with sciatica 12/07/2007   Headache 12/07/2007   Obesity, Class III, BMI 40-49.9 (morbid obesity) 08/03/2007   Anxiety state 08/03/2007    ONSET DATE: 12/05/22  REFERRING DIAG:  G89.4 (ICD-10-CM) - Chronic pain syndrome    THERAPY DIAG:  Other abnormalities of gait and mobility  Muscle weakness (generalized)  Difficulty in walking, not elsewhere classified  Unsteadiness on feet  Guillain Barr syndrome (HCC)  CIDP (chronic inflammatory demyelinating polyneuropathy) (HCC)  Other lack of coordination  Rationale for Evaluation and Treatment: Rehabilitation  SUBJECTIVE:                                                                                                                                                                                             SUBJECTIVE STATEMENT: I do not feel good. I had my IV fusion yesterday and Tuesday, and after them I feel weak and have a headache. I was also sick last week, my granddaughter gave me something. I was supposed to go to the pool but I was sick so I am going on the 27th.    PERTINENT HISTORY: Pt Continuation for PT due to chronic inflammatory demyelinating polyneuropathy   01/28/23 ASSESSMENT AND PLAN   Dana [name] is a 58 y.o. female    Demyelinating polyradiculoneuropathy  Symptom onset monophasic since September 2024, failed to respond to IVIG in January 2024,             EMG nerve conduction study showed demyelinating features, no evidence of axonal loss,             Talk with neurohospitalist Dr. Cleone Dad, ED triage, will send her for hospital admission for plasma exchange, please add on following labs, immunofixative protein electrophoresis, ANA with reflex, SSA, SSB titer, iron panel             Starting outpatient IVIG prior authorization process,   KEENYA MATERA is a 58 y.o. female who presented to the [facility] ED on 09/23/2022 with bilateral lower extremity weakness with decreased mobility that has progressed to both arms. She also reported numbness and tingling of the extremities. She was transferred to [facility] for MRI. 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  Inpatient Rehab F/B HHPT.   R knee injection 10/08/22 trigger point injections for worsening back pain 2/15  RA  PAIN:  Are you having pain? No  PRECAUTIONS: None  RED FLAGS: None   WEIGHT BEARING RESTRICTIONS: No  FALLS: Has patient fallen in last 6 months? No  LIVING ENVIRONMENT: Lives with: lives with their family Lives in: House/apartment Stairs: No Has following equipment at home: Environmental consultant - 2 wheeled and Wheelchair (power)  PLOF: Independent and Independent with basic ADLs  PATIENT GOALS: I want to get out this chair and put it down in the basement   OBJECTIVE:  Note: Objective measures were completed at Evaluation unless otherwise noted.  DIAGNOSTIC FINDINGS: Right knee radiographs 11/09/2020   FINDINGS: Severe patellofemoral joint space narrowing. Severe superior and mild inferior patellar degenerative osteophytosis. Moderate superior trochlear degenerative osteophytosis. No joint effusion. Severe medial compartment joint space narrowing with moderate peripheral medial and lateral compartment degenerative  osteophytes.   COGNITION: Overall cognitive status: Within functional limits for tasks assessed   SENSATION: Light touch: still has some numbness in L thigh down into the knee, toes are also numb a little bit   EDEMA: some BLE edema   POSTURE: rounded shoulders and forward head  LOWER EXTREMITY ROM:  grossly WFL for her means, able to move better than last time she was in PT    LOWER EXTREMITY MMT:    MMT Right Eval Left Eval  Hip flexion 4 4  Hip extension    Hip abduction 4 4  Hip adduction 4 4  Hip internal rotation    Hip external rotation    Knee flexion 4 4-  Knee extension 4- 4-  Ankle dorsiflexion    Ankle plantarflexion    Ankle inversion    Ankle eversion    (Blank rows = not tested)   TRANSFERS: Assistive device utilized: Environmental consultant - 2 wheeled and Wheelchair (power)  Sit to stand: Modified independence Stand to sit: Complete Independence Chair to chair: Modified independence  GAIT: Gait pattern: step through pattern, decreased stride length, wide BOS, poor foot clearance- Right, and poor foot clearance- Left Distance walked: in clinic distances Assistive device utilized: Environmental consultant - 2 wheeled Level of assistance: Modified independence Comments: she was able to take a few steps without the walker at eval  FUNCTIONAL TESTS:  5 times sit to stand: 13.14s from elevated mat table  Timed up and go (TUG): 29s with walker  TREATMENT DATE:  02/05/24 Transfer from power chair <> mat  LAQ 2x10 HS curls green 2x10 Seated chest press with 2# WATE 2x10 Seated AAROM 2# WATE shoulder flexion 2x10 Ball squeeze 2x10      01/20/24 Recheck 5xSTS goal Fitter pushes 2x10 STS with chest press 2# 2x8 Walking ~40ft from table to NuStep w/o AD NuStep L5x25mins Taps to side of treadmill 20 reps alt  Walk from treadmill back to mat table without AD    01/08/24 Taking steps in parallel bars without UE use- forwards, backwards, side steps  Calf raises by steps 2x10  Box taps 6" holding on to railings  Walk across steps 2HRA but can alternate feet  NuStep L5x50mins    12/30/23 NuStep L5x51mins  Standing marching with RW Standing hip abd with RW 2x10 STS 2x10 Walking laps 150ft, 180ft    12/16/23- EVAL    PATIENT EDUCATION: Education details: POC, aquatic therapy  Person educated: Patient Education method: Explanation Education comprehension: verbalized understanding  HOME EXERCISE PROGRAM: [id number]   GOALS: Goals reviewed with patient? Yes  SHORT TERM GOALS: Target date: 01/20/24  Patient will be independent with initial HEP. Baseline:  Goal status: MET  2.  Patient will demonstrate improved functional LE strength as demonstrated by 5xSTS <12s. Baseline: 13.14s from elevated mat table  Goal status: 8.75s from elevated table MET 01/20/24   LONG TERM GOALS: Target date: 02/24/24  Patient will be independent with advanced/ongoing HEP to improve outcomes and carryover.  Baseline:  Goal status: INITIAL  2.  Patient will be able to ambulate 500' with LRAD with good safety to access community.  Baseline: can walk with RW Goal status: INITIAL  3.  Patient will demonstrate decreased fall risk by scoring < 15 sec on TUG. Baseline: 29s Goal status: INITIAL  4.  Patient will be able to walk 63ft or more with no AD Baseline: took 10 steps modI  Goal status: IN PROGRESS did 75ft 01/20/24   5. Patient will increase standing tolerance to 30 mins or longer to complete household and kitchen chores  Baseline: before needing to sit  Goal status: INITIAL   ASSESSMENT:  CLINICAL IMPRESSION: Patient does not feel well today, she requested we take it light today. Her legs feel weak and she has a headache. She thought about canceling but since she missed last week, she did not want to miss another week. We mostly did  exercises seated. Transfer from chair to table required 2 HHA with modA.      EVAL Patient is a 58 y.o. 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 She is currently still getting around in her power chair for the most part. She reports using the walker in her home, using it to get to the bathroom and in the kitchen. States her standing tolerance is about . Patient was able to take a few steps today without an assistive device. She has made great improvements from the last time she was at our clinic. She expresses interest in doing aquatic therapy. We will set her up to do 1x land based and 1x a week aquatic therapy to be able to increase her standing and walking tolerance with and without an AD to improve her functional independence.   OBJECTIVE IMPAIRMENTS: Abnormal gait, decreased activity tolerance, decreased balance, decreased coordination, decreased endurance, difficulty walking, decreased ROM, decreased strength, postural dysfunction, and obesity.   ACTIVITY LIMITATIONS: carrying, lifting,  squatting, stairs, transfers, and locomotion level  PARTICIPATION LIMITATIONS: cleaning, laundry, driving, shopping, and community activity  PERSONAL FACTORS: Fitness, Past/current experiences, Time since onset of injury/illness/exacerbation, and 1-2 comorbidities: chronic pain, GBS are also affecting patient's functional outcome.   REHAB POTENTIAL: Good  CLINICAL DECISION MAKING: Stable/uncomplicated  EVALUATION COMPLEXITY: Low  PLAN:  PT FREQUENCY: 1-2x/week  PT DURATION: 10 weeks  PLANNED INTERVENTIONS: 97110-Therapeutic exercises, 97530- Therapeutic activity, [id number]- Neuromuscular re-education, 97535- Self Care, [id number]- Manual therapy, [phone]- Gait training, [phone]- Aquatic Therapy, Patient/Family education, Balance training, and Stair training  PLAN FOR NEXT SESSION: start with  any functional tasks and working on gait and balance. Endurance training   [employer], PT 02/05/2024, 12:30 PM

## 2024-02-05 ENCOUNTER — Ambulatory Visit

## 2024-02-05 DIAGNOSIS — G610 Guillain-Barre syndrome: Secondary | ICD-10-CM

## 2024-02-05 DIAGNOSIS — R278 Other lack of coordination: Secondary | ICD-10-CM

## 2024-02-05 DIAGNOSIS — R2689 Other abnormalities of gait and mobility: Secondary | ICD-10-CM

## 2024-02-05 DIAGNOSIS — R262 Difficulty in walking, not elsewhere classified: Secondary | ICD-10-CM

## 2024-02-05 DIAGNOSIS — G6181 Chronic inflammatory demyelinating polyneuritis: Secondary | ICD-10-CM | POA: Diagnosis not present

## 2024-02-05 DIAGNOSIS — M6281 Muscle weakness (generalized): Secondary | ICD-10-CM

## 2024-02-05 DIAGNOSIS — R2681 Unsteadiness on feet: Secondary | ICD-10-CM

## 2024-02-10 ENCOUNTER — Ambulatory Visit: Attending: Rehabilitation | Admitting: Rehabilitation

## 2024-02-10 ENCOUNTER — Ambulatory Visit

## 2024-02-10 ENCOUNTER — Encounter: Payer: Self-pay | Admitting: Rehabilitation

## 2024-02-10 DIAGNOSIS — R262 Difficulty in walking, not elsewhere classified: Secondary | ICD-10-CM | POA: Insufficient documentation

## 2024-02-10 DIAGNOSIS — R2681 Unsteadiness on feet: Secondary | ICD-10-CM | POA: Insufficient documentation

## 2024-02-10 DIAGNOSIS — M6281 Muscle weakness (generalized): Secondary | ICD-10-CM | POA: Insufficient documentation

## 2024-02-10 DIAGNOSIS — R2689 Other abnormalities of gait and mobility: Secondary | ICD-10-CM | POA: Insufficient documentation

## 2024-02-10 NOTE — Therapy (Deleted)
 OUTPATIENT PHYSICAL THERAPY NEURO TREATMENT   Patient Name: Dana [name] MRN: [id number] DOB:1966-02-22, 58 y.o., female Today's Date: 02/10/2024   PCP: Dana [name] REFERRING PROVIDER: Corita [name]  END OF SESSION:  PT End of Session - 02/10/24 0818     Visit Number 6   incorrect visit last time, so corrected   Date for PT Re-Evaluation 02/24/24    Authorization Type [employer]    Equipment Utilized During Treatment Other (comment)   floatation devices as needed   Activity Tolerance Patient tolerated treatment well    Behavior During Therapy Dana [name] for tasks assessed/performed                 Past Medical History:  Diagnosis Date   Anxiety    Back pain with radiation    Breast mass, right    Chronic pain    Chronic, continuous use of opioids    Complication of anesthesia 04/07/14   Allergic reaction to Lisinopril  immediately following surgery   DJD (degenerative joint disease)    Fibromyalgia    Groin abscess    Headache(784.0)    History of IBS    Hypercholesterolemia    IBS (irritable bowel syndrome)    Lactose intolerance    Mild hypertension    Obesity    Tobacco use disorder    Umbilical hernia    Symptomatic   Past Surgical History:  Procedure Laterality Date   ABDOMINAL HYSTERECTOMY     ANTERIOR CERVICAL DECOMP/DISCECTOMY FUSION N/A 12/20/2015   Procedure: ANTERIOR CERVICAL DECOMPRESSION FUSION CERVICAL 4-5, CERVICAL 5-6, CERVICAL 6-7 WITH INSTRUMENTATION AND ALLOGRAFT;  Surgeon: Dana Grimes, MD;  Location: [facility] OR;  Service: Orthopedics;  Laterality: N/A;  Anterior cervical decompression fusion, cervical 4-5, cervical 5-6, cervical 6-7 with instrumentation and allograft   BACK SURGERY     BILATERAL SALPINGECTOMY  09/03/2012   Procedure: BILATERAL SALPINGECTOMY;  Surgeon: Dana Erps, MD;  Location: [facility];  Service: Gynecology;  Laterality: Bilateral;   BREAST BIOPSY Right 04/07/2014   Procedure: REMOVAL RIGHT BREAST MASS WITH WIRE LOCALIZATION;   Surgeon: Dana Lichtenstein, MD;  Location: [name] OR;  Service: General;  Laterality: Right;   BREAST EXCISIONAL BIOPSY Left    x2   BREAST LUMPECTOMY     x2   CARPAL TUNNEL RELEASE Left 05/11/2019   Procedure: LEFT CARPAL TUNNEL RELEASE, RIGHT TENNIS ELBOW MARCAINE /DEPO MEDROL  INJECTION UNDER ANESTHESIA;  Surgeon: Dana Jean, MD;  Location: [facility] OR;  Service: Orthopedics;  Laterality: Left;   COLONOSCOPY W/ BIOPSIES  04/24/2012   per Dr. Willy [name], clear, repeat in 10 yrs    disectomy     ESOPHAGOGASTRODUODENOSCOPY     FINGER SURGERY     Right index-excision of mass    FOOT SURGERY Right    Bone Spurs   IR FLUORO GUIDE CV LINE RIGHT  01/29/2023   IR REMOVAL TUN CV CATH W/O FL  02/08/2023   IR US  GUIDE VASC ACCESS RIGHT  01/31/2023   LAPAROSCOPIC HYSTERECTOMY  09/03/2012   Procedure: HYSTERECTOMY TOTAL LAPAROSCOPIC;  Surgeon: Dana Erps, MD;  Location: [facility];  Service: Gynecology;  Laterality: N/A;   LUMBAR DISC SURGERY     TUBAL LIGATION     UMBILICAL HERNIA REPAIR N/A 05/01/2018   Procedure: UMBILICAL HERNIA REPAIR;  Surgeon: Dana Morin, MD;  Location: [facility] OR;  Service: General;  Laterality: N/A;   Patient Active Problem List   Diagnosis Date Noted   Edema 11/20/2023   Hyperkeratosis of sole 08/01/2023  CIDP (chronic inflammatory demyelinating polyneuropathy) (HCC) 01/28/2023   Gait abnormality 01/28/2023   Glaucoma 01/28/2023   Pain due to onychomycosis of toenails of both feet 01/21/2023   Wheelchair dependence 12/30/2022   Neuropathic pain 12/30/2022   Insomnia due to medical condition 12/30/2022   Acute inflammatory demyelinating polyneuropathy (HCC) 10/01/2022   UTI (urinary tract infection) 09/24/2022   Hypomagnesemia 09/24/2022   Weakness 09/23/2022   Hypokalemia 09/23/2022   B12 deficiency 09/05/2022   Folate deficiency 09/05/2022   Carpal tunnel syndrome, left upper limb 05/11/2019    Class: Chronic   Spinal stenosis of lumbar region with neurogenic  claudication 08/20/2018   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   Primary osteoarthritis of right knee 02/27/2017   Right tennis elbow 11/11/2016   Muscle cramps 01/15/2016   Bilateral leg edema 01/08/2016   Radiculopathy 12/20/2015   Hyperglycemia 12/21/2014   Mastodynia, female 11/30/2014   S/P excision of fibroadenoma of breast 11/30/2014   Angioedema of lips 04/07/2014   Fibroadenoma of right breast 03/07/2014   Pelvic pain in female 06/19/2012   Menorrhagia 06/11/2012   SUI (stress urinary incontinence, female) 06/11/2012   HTN (hypertension) 06/11/2012   IBS (irritable bowel syndrome) - diarrhea predominant 03/17/2012   MICROSCOPIC HEMATURIA 11/30/2010   BREAST PAIN, RIGHT 11/30/2010   BREAST MASS, RIGHT 01/12/2010   HYPERCHOLESTEROLEMIA 12/07/2007   CIGARETTE SMOKER 12/07/2007   Osteoarthritis 12/07/2007   Low back pain with sciatica 12/07/2007   Headache 12/07/2007   Obesity, Class III, BMI 40-49.9 (morbid obesity) 08/03/2007   Anxiety state 08/03/2007    ONSET DATE: 12/05/22  REFERRING DIAG:  G89.4 (ICD-10-CM) - Chronic pain syndrome    THERAPY DIAG:  Other abnormalities of gait and mobility  Muscle weakness (generalized)  Difficulty in walking, not elsewhere classified  Unsteadiness on feet  Rationale for Evaluation and Treatment: Rehabilitation  SUBJECTIVE:                                                                                                                                                                                             SUBJECTIVE STATEMENT: I do not feel good. I had my IV fusion yesterday and Tuesday, and after them I feel weak and have a headache. I was also sick last week, my granddaughter gave me something. I was supposed to go to the pool but I was sick so I am going on the 27th.    PERTINENT HISTORY: Pt Continuation for PT due to chronic inflammatory demyelinating polyneuropathy   01/28/23 ASSESSMENT AND PLAN   Dana [name] is a 58 y.o. female   Demyelinating polyradiculoneuropathy  Symptom onset monophasic since September 2024, failed to respond to IVIG in January 2024,             EMG nerve conduction study showed demyelinating features, no evidence of axonal loss,             Talk with neurohospitalist Dr. Cleone Dad, ED triage, will send her for hospital admission for plasma exchange, please add on following labs, immunofixative protein electrophoresis, ANA with reflex, SSA, SSB titer, iron panel             Starting outpatient IVIG prior authorization process,   REMIE MATHISON is a 58 y.o. female who [facility] ED on 09/23/2022 with bilateral lower extremity weakness with decreased mobility that has progressed to both arms. She also reported numbness and tingling of the extremities. She was transferred to [name] for MRI. 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  Inpatient Rehab F/B HHPT.   R knee injection 10/08/22 trigger point injections for worsening back pain 2/15  RA  PAIN:  Are you having pain? No  PRECAUTIONS: None  RED FLAGS: None   WEIGHT BEARING RESTRICTIONS: No  FALLS: Has patient fallen in last 6 months? No  LIVING ENVIRONMENT: Lives with: lives with their family Lives in: House/apartment Stairs: No Has following equipment at home: Environmental consultant - 2 wheeled and Wheelchair (power)  PLOF: Independent and Independent with basic ADLs  PATIENT GOALS: I want to get out this chair and put it down in the basement   OBJECTIVE:  Note: Objective measures were completed at Evaluation unless otherwise noted.  DIAGNOSTIC FINDINGS: Right knee radiographs 11/09/2020   FINDINGS: Severe patellofemoral joint space narrowing. Severe superior and mild inferior patellar degenerative osteophytosis. Moderate superior trochlear degenerative osteophytosis. No joint effusion. Severe medial compartment joint space narrowing with moderate peripheral medial and  lateral compartment degenerative osteophytes.   COGNITION: Overall cognitive status: Within functional limits for tasks assessed   SENSATION: Light touch: still has some numbness in L thigh down into the knee, toes are also numb a little bit   EDEMA: some BLE edema   POSTURE: rounded shoulders and forward head  LOWER EXTREMITY ROM:  grossly WFL for her means, able to move better than last time she was in PT    LOWER EXTREMITY MMT:    MMT Right Eval Left Eval  Hip flexion 4 4  Hip extension    Hip abduction 4 4  Hip adduction 4 4  Hip internal rotation    Hip external rotation    Knee flexion 4 4-  Knee extension 4- 4-  Ankle dorsiflexion    Ankle plantarflexion    Ankle inversion    Ankle eversion    (Blank rows = not tested)   TRANSFERS: Assistive device utilized: Environmental consultant - 2 wheeled and Wheelchair (power)  Sit to stand: Modified independence Stand to sit: Complete Independence Chair to chair: Modified independence  GAIT: Gait pattern: step through pattern, decreased stride length, wide BOS, poor foot clearance- Right, and poor foot clearance- Left Distance walked: in clinic distances Assistive device utilized: Environmental consultant - 2 wheeled Level of assistance: Modified independence Comments: she was able to take a few steps without the walker at eval  FUNCTIONAL TESTS:  5 times sit to stand: 13.14s from elevated mat table  Timed up and go (TUG): 29s with walker  TREATMENT DATE:  02/10/24  Patient seen for aquatic therapy today.  Treatment took place in water  3.6-4.0 feet deep depending upon activity.  Pt entered and exited the pool via chair lift; pt was transferred from her power wheelchair to pool chair lift with mod assist using squat pivot transfer.  Pool temp approx 92 deg.    Pt requires buoyancy of water  for support for reduced fall risk  and for unloading/reduced stress on joints as pt able to tolerate increased standing and ambulation in water  compared to that on land; viscosity of water  is needed for resistance for strengthening and current of water  provides perturbations for challenge for balance training    12/16/23- EVAL    PATIENT EDUCATION: Education details: POC, aquatic therapy  Person educated: Patient Education method: Explanation Education comprehension: verbalized understanding  HOME EXERCISE PROGRAM: [id number]   GOALS: Goals reviewed with patient? Yes  SHORT TERM GOALS: Target date: 01/20/24  Patient will be independent with initial HEP. Baseline:  Goal status: MET  2.  Patient will demonstrate improved functional LE strength as demonstrated by 5xSTS <12s. Baseline: 13.14s from elevated mat table  Goal status: 8.75s from elevated table MET 01/20/24   LONG TERM GOALS: Target date: 02/24/24  Patient will be independent with advanced/ongoing HEP to improve outcomes and carryover.  Baseline:  Goal status: INITIAL  2.  Patient will be able to ambulate 500' with LRAD with good safety to access community.  Baseline: can walk with RW Goal status: INITIAL  3.  Patient will demonstrate decreased fall risk by scoring < 15 sec on TUG. Baseline: 29s Goal status: INITIAL  4.  Patient will be able to walk 71ft or more with no AD Baseline: took 10 steps modI  Goal status: IN PROGRESS did 24ft 01/20/24   5. Patient will increase standing tolerance to 30 mins or longer to complete household and kitchen chores  Baseline: before needing to sit  Goal status: INITIAL   ASSESSMENT:  CLINICAL IMPRESSION: Pt presents for first aquatic session today at [facility].      EVAL Patient is a 58 y.o. 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 She is currently still getting around in her power chair  for the most part. She reports using the walker in her home, using it to get to the bathroom and in the kitchen. States her standing tolerance is about . Patient was able to take a few steps today without an assistive device. She has made great improvements from the last time she was at our clinic. She expresses interest in doing aquatic therapy. We will set her up to do 1x land based and 1x a week aquatic therapy to be able to increase her standing and walking tolerance with and without an AD to improve her functional independence.   OBJECTIVE IMPAIRMENTS: Abnormal gait, decreased activity tolerance, decreased balance, decreased coordination, decreased endurance, difficulty walking, decreased ROM, decreased strength, postural dysfunction, and obesity.   ACTIVITY LIMITATIONS: carrying, lifting, squatting, stairs, transfers, and locomotion level  PARTICIPATION LIMITATIONS: cleaning, laundry, driving, shopping, and community activity  PERSONAL FACTORS: Fitness, Past/current experiences, Time since onset of injury/illness/exacerbation, and 1-2 comorbidities: chronic pain, GBS are also affecting patient's functional outcome.   REHAB POTENTIAL: Good  CLINICAL DECISION MAKING: Stable/uncomplicated  EVALUATION COMPLEXITY: Low  PLAN:  PT FREQUENCY: 1-2x/week  PT DURATION: 10 weeks  PLANNED INTERVENTIONS: 97110-Therapeutic exercises, 97530- Therapeutic activity, [id number]- Neuromuscular re-education, 97535- Self  Care, [id number]- Manual therapy, [phone]- Gait training, [phone]- Aquatic Therapy, Patient/Family education, Balance training, and Stair training  PLAN FOR NEXT SESSION: start with any functional tasks and working on gait and balance. Endurance training   Terri Fester, PT, MPT [facility] [address] [place], [place], [address] Phone: [phone]   Fax:  [phone] 02/10/24, 8:19 AM

## 2024-02-11 NOTE — Therapy (Signed)
 OUTPATIENT PHYSICAL THERAPY NEURO TREATMENT   Patient Name: Dana [name] MRN: [id number] DOB:1966-08-05, 58 y.o., female Today's Date: 02/12/2024   PCP: Corita Diego REFERRING PROVIDER: Corita Diego  END OF SESSION:  PT End of Session - 02/12/24 1056     Visit Number 7    Date for PT Re-Evaluation 02/24/24    Authorization Type [employer]    PT Start Time 1100    PT Stop Time 1145    PT Time Calculation (min) 45 min                  Past Medical History:  Diagnosis Date   Anxiety    Back pain with radiation    Breast mass, right    Chronic pain    Chronic, continuous use of opioids    Complication of anesthesia 04/07/14   Allergic reaction to Lisinopril  immediately following surgery   DJD (degenerative joint disease)    Fibromyalgia    Groin abscess    Headache(784.0)    History of IBS    Hypercholesterolemia    IBS (irritable bowel syndrome)    Lactose intolerance    Mild hypertension    Obesity    Tobacco use disorder    Umbilical hernia    Symptomatic   Past Surgical History:  Procedure Laterality Date   ABDOMINAL HYSTERECTOMY     ANTERIOR CERVICAL DECOMP/DISCECTOMY FUSION N/A 12/20/2015   Procedure: ANTERIOR CERVICAL DECOMPRESSION FUSION CERVICAL 4-5, CERVICAL 5-6, CERVICAL 6-7 WITH INSTRUMENTATION AND ALLOGRAFT;  Surgeon: Dana Grimes, MD;  Location: [facility] OR;  Service: Orthopedics;  Laterality: N/A;  Anterior cervical decompression fusion, cervical 4-5, cervical 5-6, cervical 6-7 with instrumentation and allograft   BACK SURGERY     BILATERAL SALPINGECTOMY  09/03/2012   Procedure: BILATERAL SALPINGECTOMY;  Surgeon: Dana Erps, MD;  Location: [facility];  Service: Gynecology;  Laterality: Bilateral;   BREAST BIOPSY Right 04/07/2014   Procedure: REMOVAL RIGHT BREAST MASS WITH WIRE LOCALIZATION;  Surgeon: Dana Lichtenstein, MD;  Location: [facility] OR;  Service: General;  Laterality: Right;   BREAST EXCISIONAL BIOPSY Left    x2   BREAST LUMPECTOMY     x2    CARPAL TUNNEL RELEASE Left 05/11/2019   Procedure: LEFT CARPAL TUNNEL RELEASE, RIGHT TENNIS ELBOW MARCAINE /DEPO MEDROL  INJECTION UNDER ANESTHESIA;  Surgeon: Dana Jean, MD;  Location: [facility] OR;  Service: Orthopedics;  Laterality: Left;   COLONOSCOPY W/ BIOPSIES  04/24/2012   per Dr. Willy Harvest, clear, repeat in 10 yrs    disectomy     ESOPHAGOGASTRODUODENOSCOPY     FINGER SURGERY     Right index-excision of mass    FOOT SURGERY Right    Bone Spurs   IR FLUORO GUIDE CV LINE RIGHT  01/29/2023   IR REMOVAL TUN CV CATH W/O FL  02/08/2023   IR US  GUIDE VASC ACCESS RIGHT  01/31/2023   LAPAROSCOPIC HYSTERECTOMY  09/03/2012   Procedure: HYSTERECTOMY TOTAL LAPAROSCOPIC;  Surgeon: Dana Erps, MD;  Location: [facility];  Service: Gynecology;  Laterality: N/A;   LUMBAR DISC SURGERY     TUBAL LIGATION     UMBILICAL HERNIA REPAIR N/A 05/01/2018   Procedure: UMBILICAL HERNIA REPAIR;  Surgeon: Dana Morin, MD;  Location: [facility] OR;  Service: General;  Laterality: N/A;   Patient Active Problem List   Diagnosis Date Noted   Edema 11/20/2023   Hyperkeratosis of sole 08/01/2023   CIDP (chronic inflammatory demyelinating polyneuropathy) (HCC) 01/28/2023   Gait abnormality 01/28/2023  Glaucoma 01/28/2023   Pain due to onychomycosis of toenails of both feet 01/21/2023   Wheelchair dependence 12/30/2022   Neuropathic pain 12/30/2022   Insomnia due to medical condition 12/30/2022   Acute inflammatory demyelinating polyneuropathy (HCC) 10/01/2022   UTI (urinary tract infection) 09/24/2022   Hypomagnesemia 09/24/2022   Weakness 09/23/2022   Hypokalemia 09/23/2022   B12 deficiency 09/05/2022   Folate deficiency 09/05/2022   Carpal tunnel syndrome, left upper limb 05/11/2019    Class: Chronic   Spinal stenosis of lumbar region with neurogenic claudication 08/20/2018   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   Primary osteoarthritis of right knee 02/27/2017   Right tennis elbow 11/11/2016   Muscle cramps  01/15/2016   Bilateral leg edema 01/08/2016   Radiculopathy 12/20/2015   Hyperglycemia 12/21/2014   Mastodynia, female 11/30/2014   S/P excision of fibroadenoma of breast 11/30/2014   Angioedema of lips 04/07/2014   Fibroadenoma of right breast 03/07/2014   Pelvic pain in female 06/19/2012   Menorrhagia 06/11/2012   SUI (stress urinary incontinence, female) 06/11/2012   HTN (hypertension) 06/11/2012   IBS (irritable bowel syndrome) - diarrhea predominant 03/17/2012   MICROSCOPIC HEMATURIA 11/30/2010   BREAST PAIN, RIGHT 11/30/2010   BREAST MASS, RIGHT 01/12/2010   HYPERCHOLESTEROLEMIA 12/07/2007   CIGARETTE SMOKER 12/07/2007   Osteoarthritis 12/07/2007   Low back pain with sciatica 12/07/2007   Headache 12/07/2007   Obesity, Class III, BMI 40-49.9 (morbid obesity) 08/03/2007   Anxiety state 08/03/2007    ONSET DATE: 12/05/22  REFERRING DIAG:  G89.4 (ICD-10-CM) - Chronic pain syndrome    THERAPY DIAG:  Other abnormalities of gait and mobility  Muscle weakness (generalized)  Difficulty in walking, not elsewhere classified  Unsteadiness on feet  Guillain Barr syndrome (HCC)  CIDP (chronic inflammatory demyelinating polyneuropathy) (HCC)  Other lack of coordination  Rationale for Evaluation and Treatment: Rehabilitation  SUBJECTIVE:                                                                                                                                                                                             SUBJECTIVE STATEMENT: I did not go to the pool because it was raining. There is something wrong with my foot. It is swollen.    PERTINENT HISTORY: Pt Continuation for PT due to chronic inflammatory demyelinating polyneuropathy   01/28/23 ASSESSMENT AND PLAN   Dana [name] is a 58 y.o. female   Demyelinating polyradiculoneuropathy             Symptom onset monophasic since September 2024, failed to respond to IVIG in January 2024,  EMG nerve conduction study showed demyelinating features, no evidence of axonal loss,             Talk with neurohospitalist Dr. Cleone [name], ED triage, will send her for hospital admission for plasma exchange, please add on following labs, immunofixative protein electrophoresis, ANA with reflex, SSA, SSB titer, iron panel             Starting outpatient IVIG prior authorization process,   CHLOIE LONEY is a 58 y.o. female who [facility] the [facility] ED on 09/23/2022 with bilateral lower extremity weakness with decreased mobility that has progressed to both arms. She also reported numbness and tingling of the extremities. She was transferred to [facility] for MRI. 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  Inpatient Rehab F/B HHPT.   R knee injection 10/08/22 trigger point injections for worsening back pain 2/15  RA  PAIN:  Are you having pain? No  PRECAUTIONS: None  RED FLAGS: None   WEIGHT BEARING RESTRICTIONS: No  FALLS: Has patient fallen in last 6 months? No  LIVING ENVIRONMENT: Lives with: lives with their family Lives in: House/apartment Stairs: No Has following equipment at home: Environmental consultant - 2 wheeled and Wheelchair (power)  PLOF: Independent and Independent with basic ADLs  PATIENT GOALS: I want to get out this chair and put it down in the basement   OBJECTIVE:  Note: Objective measures were completed at Evaluation unless otherwise noted.  DIAGNOSTIC FINDINGS: Right knee radiographs 11/09/2020   FINDINGS: Severe patellofemoral joint space narrowing. Severe superior and mild inferior patellar degenerative osteophytosis. Moderate superior trochlear degenerative osteophytosis. No joint effusion. Severe medial compartment joint space narrowing with moderate peripheral medial and lateral compartment degenerative osteophytes.   COGNITION: Overall cognitive status: Within functional limits for tasks assessed   SENSATION: Light touch: still has some numbness in  L thigh down into the knee, toes are also numb a little bit   EDEMA: some BLE edema   POSTURE: rounded shoulders and forward head  LOWER EXTREMITY ROM:  grossly WFL for her means, able to move better than last time she was in PT    LOWER EXTREMITY MMT:    MMT Right Eval Left Eval  Hip flexion 4 4  Hip extension    Hip abduction 4 4  Hip adduction 4 4  Hip internal rotation    Hip external rotation    Knee flexion 4 4-  Knee extension 4- 4-  Ankle dorsiflexion    Ankle plantarflexion    Ankle inversion    Ankle eversion    (Blank rows = not tested)   TRANSFERS: Assistive device utilized: Environmental consultant - 2 wheeled and Wheelchair (power)  Sit to stand: Modified independence Stand to sit: Complete Independence Chair to chair: Modified independence  GAIT: Gait pattern: step through pattern, decreased stride length, wide BOS, poor foot clearance- Right, and poor foot clearance- Left Distance walked: in clinic distances Assistive device utilized: Environmental consultant - 2 wheeled Level of assistance: Modified independence Comments: she was able to take a few steps without the walker at eval  FUNCTIONAL TESTS:  5 times sit to stand: 13.14s from elevated mat table  Timed up and go (TUG): 29s with walker  TREATMENT DATE:  02/12/24 TUG with walker  NuStep L5x75mins  Seated calf to toe raises 2x12 STS 2x10 Box taps 4"  Walking 1 lap with walker ~177ft   02/05/24 Transfer from power chair <> mat  LAQ 2x10 HS curls green 2x10 Seated chest press with 2# WATE 2x10 Seated AAROM 2# WATE shoulder flexion 2x10 Ball squeeze 2x10      01/20/24 Recheck 5xSTS goal Fitter pushes 2x10 STS with chest press 2# 2x8 Walking ~30ft from table to NuStep w/o AD NuStep L5x72mins Taps to side of treadmill 20 reps alt  Walk from treadmill back to mat table without AD    01/08/24 Taking steps in parallel bars without UE use- forwards, backwards, side steps  Calf raises by steps 2x10  Box taps 6" holding on to railings  Walk across steps 2HRA but can alternate feet  NuStep L5x52mins    12/30/23 NuStep L5x63mins  Standing marching with RW Standing hip abd with RW 2x10 STS 2x10 Walking laps 129ft, 140ft    12/16/23- EVAL    PATIENT EDUCATION: Education details: POC, aquatic therapy  Person educated: Patient Education method: Explanation Education comprehension: verbalized understanding  HOME EXERCISE PROGRAM: [id number]   GOALS: Goals reviewed with patient? Yes  SHORT TERM GOALS: Target date: 01/20/24  Patient will be independent with initial HEP. Baseline:  Goal status: MET  2.  Patient will demonstrate improved functional LE strength as demonstrated by 5xSTS <12s. Baseline: 13.14s from elevated mat table  Goal status: 8.75s from elevated table MET 01/20/24   LONG TERM GOALS: Target date: 02/24/24  Patient will be independent with advanced/ongoing HEP to improve outcomes and carryover.  Baseline:  Goal status: INITIAL  2.  Patient will be able to ambulate 500' with LRAD with good safety to access community.  Baseline: can walk with RW Goal status: IN PROGRESS  3.  Patient will demonstrate decreased fall risk by scoring < 15 sec on TUG. Baseline: 29s Goal status: ongoing 44s w/walker   4.  Patient will be able to walk 32ft or more with no AD Baseline: took 10 steps modI  Goal status: IN PROGRESS did 93ft 01/20/24   5. Patient will increase standing tolerance to 30 mins or longer to complete household and kitchen chores  Baseline: before needing to sit  Goal status: ongoing    ASSESSMENT:  CLINICAL IMPRESSION: Patient returns with pain in her L foot. She reports pain with weight bearing. She has increased swelling in bilateral ankles. She reports the pain in her foot is arthirtis related. Continued to work on mostly  functional strengthening and endurance. Was advised that if the swelling got worse or did not decrease in the next few days to see her doctor about it.    EVAL Patient is a 58 y.o. 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 She is currently still getting around in her power chair for the most part. She reports using the walker in her home, using it to get to the bathroom and in the kitchen. States her standing tolerance is about . Patient was able to take a few steps today without an assistive device. She has made great improvements from the last time she was at our clinic. She expresses interest in doing aquatic therapy. We will set her up to do 1x land based and 1x a week aquatic therapy to be able to increase her standing and walking tolerance with and  without an AD to improve her functional independence.   OBJECTIVE IMPAIRMENTS: Abnormal gait, decreased activity tolerance, decreased balance, decreased coordination, decreased endurance, difficulty walking, decreased ROM, decreased strength, postural dysfunction, and obesity.   ACTIVITY LIMITATIONS: carrying, lifting, squatting, stairs, transfers, and locomotion level  PARTICIPATION LIMITATIONS: cleaning, laundry, driving, shopping, and community activity  PERSONAL FACTORS: Fitness, Past/current experiences, Time since onset of injury/illness/exacerbation, and 1-2 comorbidities: chronic pain, GBS are also affecting patient's functional outcome.   REHAB POTENTIAL: Good  CLINICAL DECISION MAKING: Stable/uncomplicated  EVALUATION COMPLEXITY: Low  PLAN:  PT FREQUENCY: 1-2x/week  PT DURATION: 10 weeks  PLANNED INTERVENTIONS: 97110-Therapeutic exercises, 97530- Therapeutic activity, [id number]- Neuromuscular re-education, 97535- Self Care, 16109- Manual therapy, [phone]- Gait training, [phone]- Aquatic Therapy, Patient/Family education,  Balance training, and Stair training  PLAN FOR NEXT SESSION: start with any functional tasks and working on gait and balance. Endurance training   [employer], PT 02/12/2024, 11:43 AM

## 2024-02-12 ENCOUNTER — Ambulatory Visit (HOSPITAL_BASED_OUTPATIENT_CLINIC_OR_DEPARTMENT_OTHER): Admitting: Physical Therapy

## 2024-02-12 ENCOUNTER — Ambulatory Visit: Admitting: Surgical

## 2024-02-12 ENCOUNTER — Ambulatory Visit

## 2024-02-12 DIAGNOSIS — R262 Difficulty in walking, not elsewhere classified: Secondary | ICD-10-CM

## 2024-02-12 DIAGNOSIS — R278 Other lack of coordination: Secondary | ICD-10-CM

## 2024-02-12 DIAGNOSIS — G6181 Chronic inflammatory demyelinating polyneuritis: Secondary | ICD-10-CM

## 2024-02-12 DIAGNOSIS — R2689 Other abnormalities of gait and mobility: Secondary | ICD-10-CM

## 2024-02-12 DIAGNOSIS — G610 Guillain-Barre syndrome: Secondary | ICD-10-CM

## 2024-02-12 DIAGNOSIS — M6281 Muscle weakness (generalized): Secondary | ICD-10-CM

## 2024-02-12 DIAGNOSIS — R2681 Unsteadiness on feet: Secondary | ICD-10-CM

## 2024-02-14 DIAGNOSIS — E785 Hyperlipidemia, unspecified: Secondary | ICD-10-CM | POA: Diagnosis not present

## 2024-02-16 ENCOUNTER — Telehealth: Payer: Self-pay | Admitting: Family Medicine

## 2024-02-16 ENCOUNTER — Other Ambulatory Visit: Payer: Self-pay | Admitting: Family Medicine

## 2024-02-16 DIAGNOSIS — Z1231 Encounter for screening mammogram for malignant neoplasm of breast: Secondary | ICD-10-CM

## 2024-02-16 NOTE — Telephone Encounter (Signed)
 Copied from CRM [id number]. Topic: Referral - Question >> Feb 16, 2024  4:37 PM Melissa C wrote: Reason for CRM: patient would like to speak with Nurse Haskell Linker about referral that she needs from Dr. Alyne Babinski. I asked patient what referral it was regarding and she just stated she needs to speak with Nurse Haskell Linker.

## 2024-02-16 NOTE — Telephone Encounter (Signed)
 Spoke with pt is requesting for an order for [employer] scooter, Order need to go to [employer] number [phone]. Please advise

## 2024-02-16 NOTE — Telephone Encounter (Signed)
 The referral info has been given to nancy

## 2024-02-17 NOTE — Telephone Encounter (Signed)
The RX is ready to be faxed

## 2024-02-17 NOTE — Telephone Encounter (Signed)
 Pt Rx for electric scooted faxed to [facility], pt notified state that she has a f/u appointment on 02/18/24 to discuss this.

## 2024-02-18 ENCOUNTER — Encounter: Payer: Self-pay | Admitting: Family Medicine

## 2024-02-18 ENCOUNTER — Telehealth (INDEPENDENT_AMBULATORY_CARE_PROVIDER_SITE_OTHER): Admitting: Family Medicine

## 2024-02-18 DIAGNOSIS — G6181 Chronic inflammatory demyelinating polyneuritis: Secondary | ICD-10-CM | POA: Diagnosis not present

## 2024-02-18 NOTE — Progress Notes (Signed)
 Subjective:    Patient ID: Dana [name], female    DOB: 07/05/1966, 58 y.o.   MRN: [id number]  HPI Virtual Visit via Video Note  I connected with the patient on 02/18/24 at  1:45 PM EDT by a video enabled telemedicine application and verified that I am speaking with the correct person using two identifiers.  Location patient: home Location provider:work or home office Persons participating in the virtual visit: patient, provider  I discussed the limitations of evaluation and management by telemedicine and the availability of in person appointments. The patient expressed understanding and agreed to proceed.   HPI: Here to ask about equipment for her to get around with. She uses a walker around her house, but she uses a wheelchair to go out to places. Her current walker is broken. As for her wheelchair, it has become almost impossible for her husband to push her around because she is heavy.    ROS: See pertinent positives and negatives per HPI.  Past Medical History:  Diagnosis Date   Anxiety    Back pain with radiation    Breast mass, right    Chronic pain    Chronic, continuous use of opioids    Complication of anesthesia 04/07/14   Allergic reaction to Lisinopril  immediately following surgery   DJD (degenerative joint disease)    Fibromyalgia    Groin abscess    Headache(784.0)    History of IBS    Hypercholesterolemia    IBS (irritable bowel syndrome)    Lactose intolerance    Mild hypertension    Obesity    Tobacco use disorder    Umbilical hernia    Symptomatic    Past Surgical History:  Procedure Laterality Date   ABDOMINAL HYSTERECTOMY     ANTERIOR CERVICAL DECOMP/DISCECTOMY FUSION N/A 12/20/2015   Procedure: ANTERIOR CERVICAL DECOMPRESSION FUSION CERVICAL 4-5, CERVICAL 5-6, CERVICAL 6-7 WITH INSTRUMENTATION AND ALLOGRAFT;  Surgeon: Virl Grimes, MD;  Location: [facility] OR;  Service: Orthopedics;  Laterality: N/A;  Anterior cervical decompression fusion, cervical  4-5, cervical 5-6, cervical 6-7 with instrumentation and allograft   BACK SURGERY     BILATERAL SALPINGECTOMY  09/03/2012   Procedure: BILATERAL SALPINGECTOMY;  Surgeon: Davia Erps, MD;  Location: [facility];  Service: Gynecology;  Laterality: Bilateral;   BREAST BIOPSY Right 04/07/2014   Procedure: REMOVAL RIGHT BREAST MASS WITH WIRE LOCALIZATION;  Surgeon: Harlee Lichtenstein, MD;  Location: [facility] OR;  Service: General;  Laterality: Right;   BREAST EXCISIONAL BIOPSY Left    x2   BREAST LUMPECTOMY     x2   CARPAL TUNNEL RELEASE Left 05/11/2019   Procedure: LEFT CARPAL TUNNEL RELEASE, RIGHT TENNIS ELBOW MARCAINE /DEPO MEDROL  INJECTION UNDER ANESTHESIA;  Surgeon: Alphonso Jean, MD;  Location: [facility] OR;  Service: Orthopedics;  Laterality: Left;   COLONOSCOPY W/ BIOPSIES  04/24/2012   per Dr. Willy Harvest, clear, repeat in 10 yrs    disectomy     ESOPHAGOGASTRODUODENOSCOPY     FINGER SURGERY     Right index-excision of mass    FOOT SURGERY Right    Bone Spurs   IR FLUORO GUIDE CV LINE RIGHT  01/29/2023   IR REMOVAL TUN CV CATH W/O FL  02/08/2023   IR US  GUIDE VASC ACCESS RIGHT  01/31/2023   LAPAROSCOPIC HYSTERECTOMY  09/03/2012   Procedure: HYSTERECTOMY TOTAL LAPAROSCOPIC;  Surgeon: Davia Erps, MD;  Location: [facility];  Service: Gynecology;  Laterality: N/A;   LUMBAR DISC SURGERY  TUBAL LIGATION     UMBILICAL HERNIA REPAIR N/A 05/01/2018   Procedure: UMBILICAL HERNIA REPAIR;  Surgeon: Jerryl Morin, MD;  Location: [facility] OR;  Service: General;  Laterality: N/A;    Family History  Problem Relation Age of Onset   Diabetes Mother    Diabetes Father    Breast cancer Maternal Aunt 46   Prostate cancer Paternal Grandfather      Current Outpatient Medications:    ALPRAZolam  (XANAX ) 0.5 MG tablet, Take 1 tablet (0.5 mg total) by mouth 2 (two) times daily as needed for anxiety., Disp: 180 tablet, Rfl: 1   ammonium lactate  (LAC-HYDRIN ) 12 % lotion, APPLY TOPICALLY IN THE MORNING AND AT BEDTIME, Disp:  400 mL, Rfl: 3   benzonatate  (TESSALON ) 200 MG capsule, TAKE 1 CAPSULE (200 MG TOTAL) BY MOUTH EVERY 6 (SIX) HOURS AS NEEDED FOR COUGH., Disp: 60 capsule, Rfl: 3   celecoxib  (CELEBREX ) 100 MG capsule, Take 1 capsule (100 mg total) by mouth 2 (two) times daily., Disp: 180 capsule, Rfl: 3   cephALEXin  (KEFLEX ) 500 MG capsule, Take 1 capsule (500 mg total) by mouth 4 (four) times daily., Disp: 40 capsule, Rfl: 0   Cyanocobalamin  (VITAMIN B-12 PO), Take 1 tablet by mouth daily., Disp: , Rfl:    cyclobenzaprine  (FLEXERIL ) 10 MG tablet, TAKE 1 TABLET BY MOUTH THREE TIMES A DAY, Disp: 90 tablet, Rfl: 5   dorzolamide -timolol  (COSOPT ) 2-0.5 % ophthalmic solution, Place 1 drop into both eyes 2 (two) times daily., Disp: 10 mL, Rfl: 1   DULoxetine  (CYMBALTA ) 60 MG capsule, TAKE 1 CAPSULE BY MOUTH EVERY DAY, Disp: 90 capsule, Rfl: 4   famotidine  (PEPCID ) 20 MG tablet, Take 20 mg by mouth 2 (two) times daily., Disp: , Rfl:    fluticasone  (FLONASE ) 50 MCG/ACT nasal spray, SPRAY 2 SPRAYS INTO EACH NOSTRIL EVERY DAY, Disp: 16 mL, Rfl: 11   folic acid  (FOLVITE ) 1 MG tablet, TAKE 1 TABLET BY MOUTH EVERY DAY, Disp: 90 tablet, Rfl: 3   furosemide  (LASIX ) 40 MG tablet, TAKE 1 TABLET BY MOUTH TWICE A DAY, Disp: 180 tablet, Rfl: 3   gabapentin  (NEURONTIN ) 300 MG capsule, Take 3 capsules (900 mg total) by mouth 3 (three) times daily., Disp: 270 capsule, Rfl: 5   hydrocerin (EUCERIN) CREA, Apply 1 Application topically 2 (two) times daily., Disp: , Rfl: 0   HYDROmorphone  (DILAUDID ) 8 MG tablet, Take 1 tablet (8 mg total) by mouth every 6 (six) hours as needed for severe pain (pain score 7-10)., Disp: 120 tablet, Rfl: 0   ibuprofen  (ADVIL ) 200 MG tablet, Take 400 mg by mouth 2 (two) times daily as needed for headache or moderate pain., Disp: , Rfl:    Immune Globulin , Human, (OCTAGAM IV), Inject 85 g into the vein every 21 ( twenty-one) days. 10% octagam, Disp: , Rfl:    latanoprost  (XALATAN ) 0.005 % ophthalmic solution,  Place 1 drop into both eyes at bedtime., Disp: 2.5 mL, Rfl: 1   magnesium  oxide (MAG-OX) 400 MG tablet, Take 1 tablet (400 mg total) by mouth at bedtime. (Patient taking differently: Take 200 mg by mouth at bedtime.), Disp: 90 tablet, Rfl: 3   metoprolol  tartrate (LOPRESSOR ) 50 MG tablet, TAKE 1 TABLET BY MOUTH TWICE A DAY, Disp: 180 tablet, Rfl: 3   Multiple Vitamin (MULTIVITAMIN WITH MINERALS) TABS tablet, Take 1 tablet by mouth daily., Disp: 30 tablet, Rfl: 0   pantoprazole  (PROTONIX ) 40 MG tablet, TAKE 1 TABLET BY MOUTH EVERY DAY, Disp: 90 tablet, Rfl: 3  potassium chloride  (KLOR-CON ) 10 MEQ tablet, TAKE 1 TABLET BY MOUTH 2 TIMES DAILY., Disp: 180 tablet, Rfl: 2   promethazine  (PHENERGAN ) 25 MG tablet, Take 25 mg by mouth every 6 (six) hours as needed for nausea or vomiting., Disp: , Rfl:    senna-docusate (SENOKOT-S) 8.6-50 MG tablet, Take 1 tablet by mouth 2 (two) times daily. (Patient taking differently: Take 1 tablet by mouth 2 (two) times daily as needed for moderate constipation.), Disp: , Rfl:    traZODone  (DESYREL ) 50 MG tablet, TAKE 1 TO 2 TABLETS BY MOUTH AT BEDTIME AS NEEDED FOR SLEEP, Disp: 180 tablet, Rfl: 1   HYDROmorphone  (DILAUDID ) 8 MG tablet, Take 1 tablet (8 mg total) by mouth every 6 (six) hours as needed for severe pain (pain score 7-10)., Disp: 120 tablet, Rfl: 0  EXAM:  VITALS per patient if applicable:  GENERAL: alert, oriented, appears well and in no acute distress  HEENT: atraumatic, conjunttiva clear, no obvious abnormalities on inspection of external nose and ears  NECK: normal movements of the head and neck  LUNGS: on inspection no signs of respiratory distress, breathing rate appears normal, no obvious gross SOB, gasping or wheezing  CV: no obvious cyanosis  MS: moves all visible extremities without noticeable abnormality  PSYCH/NEURO: pleasant and cooperative, no obvious depression or anxiety, speech and thought processing grossly intact  ASSESSMENT  AND PLAN: Polyneuropathy. We have already faxed over a RX for her to get an electric scooter. We will also send in a RX for a new walker.  Corita Diego, MD  Discussed the following assessment and plan:  No diagnosis found.     I discussed the assessment and treatment plan with the patient. The patient was provided an opportunity to ask questions and all were answered. The patient agreed with the plan and demonstrated an understanding of the instructions.   The patient was advised to call back or seek an in-person evaluation if the symptoms worsen or if the condition fails to improve as anticipated.      Review of Systems     Objective:   Physical Exam        Assessment & Plan:

## 2024-02-19 ENCOUNTER — Telehealth: Payer: Self-pay

## 2024-02-19 NOTE — Telephone Encounter (Signed)
 Pt order for a rolling walker was faxed to [facility], confirmation received

## 2024-02-20 ENCOUNTER — Ambulatory Visit: Payer: Self-pay | Admitting: Podiatry

## 2024-02-20 ENCOUNTER — Other Ambulatory Visit: Payer: Self-pay | Admitting: Physical Medicine and Rehabilitation

## 2024-02-27 ENCOUNTER — Ambulatory Visit: Admitting: Surgical

## 2024-03-01 ENCOUNTER — Other Ambulatory Visit: Payer: Self-pay

## 2024-03-01 ENCOUNTER — Telehealth: Payer: Self-pay

## 2024-03-01 NOTE — Telephone Encounter (Signed)
 Ask if they have 4 mg hydromorphone 

## 2024-03-01 NOTE — Telephone Encounter (Signed)
 Spoke with pt pharmacy states that they don't have any strength of Hydromorphone  available.

## 2024-03-01 NOTE — Telephone Encounter (Signed)
 Copied from CRM [id number]. Topic: Clinical - Medication Question >> Mar 01, 2024  9:09 AM Allyne Areola wrote: Reason for CRM: Patient is calling due to the pharmacy not having HYDROmorphone  (DILAUDID ) 8 MG tablet [[id number]]. She would like to know if there is an alternative medication she can take for pain.

## 2024-03-01 NOTE — Telephone Encounter (Signed)
 She does not tolerate ANY narcotic medication except this one. I suggest she call around to find another pharmacy that has it so I can send them a RX

## 2024-03-02 ENCOUNTER — Ambulatory Visit

## 2024-03-02 NOTE — Telephone Encounter (Signed)
 I spoke to the patient and she states that she has called everywhere and it is on back order.  I called the [facility] at [place].  They do not have it, but they can have it sent to them if you would like to have it filled there.

## 2024-03-02 NOTE — Telephone Encounter (Signed)
 Please ask them to order this. She cannot take ANY other narcotic medication due to side effects

## 2024-03-02 NOTE — Addendum Note (Signed)
 Addended by: Corita Diego A on: 03/02/2024 05:16 PM   Modules accepted: Orders

## 2024-03-02 NOTE — Addendum Note (Signed)
 Addended by: Nicolina Barrios B on: 03/02/2024 04:32 PM   Modules accepted: Orders

## 2024-03-03 ENCOUNTER — Other Ambulatory Visit (HOSPITAL_BASED_OUTPATIENT_CLINIC_OR_DEPARTMENT_OTHER): Payer: Self-pay

## 2024-03-03 NOTE — Telephone Encounter (Unsigned)
 Copied from CRM [id number]. Topic: General - Other >> Mar 03, 2024 12:12 PM Howard Macho wrote: Reason for CRM: patient called stating she is checking on her medication for HYDROmorphone  >> Mar 03, 2024  3:59 PM Alysia Jumbo S wrote: Att: Haskell Linker- Patient requesting a callback regarding medication, Hydromorphone . States pharmacy did not receive prescription.  [phone]

## 2024-03-03 NOTE — Telephone Encounter (Signed)
 Left detailed message on machine for patient letting her know that Dr Alyne Babinski has sent in her refill to [facility] at [facility].

## 2024-03-04 ENCOUNTER — Other Ambulatory Visit: Payer: Self-pay | Admitting: Family Medicine

## 2024-03-04 ENCOUNTER — Other Ambulatory Visit: Payer: Self-pay | Admitting: Physical Medicine and Rehabilitation

## 2024-03-04 ENCOUNTER — Telehealth: Payer: Self-pay | Admitting: *Deleted

## 2024-03-04 MED ORDER — OXYCODONE HCL 20 MG PO TABS: 20.0000 mg | ORAL_TABLET | Freq: Four times a day (QID) | ORAL | 0 refills | Status: DC | PRN

## 2024-03-04 NOTE — Addendum Note (Signed)
 Addended by: Corita Diego A on: 03/04/2024 11:43 AM   Modules accepted: Orders

## 2024-03-04 NOTE — Telephone Encounter (Signed)
 Patient called back for updates. I informed her that Haskell Linker is actively working on finding an alternative for her pain medicine. She stated that she is worried that Dr. Alyne Babinski may not address this matter because he leaves at 12p. She asked for Haskell Linker to call her back when she can. Please advise.

## 2024-03-04 NOTE — Telephone Encounter (Signed)
 Since the Hydromorphone  is still not available, we spoke to Dana [name] and she agreed to try Oxycodone  instead for one month or at least until the Hydromorphone  is available again

## 2024-03-04 NOTE — Telephone Encounter (Signed)
 Copied from CRM [id number]. Topic: Clinical - Medication Question >> Mar 04, 2024 12:39 PM Martinique E wrote: Reason for CRM: Lovena Rubinstein, pharmacist with [facility], called in stating that he will need a diagnosis code that goes with the patient's Oxycodone  HCl 20 MG TABS prescription. Callback number for Lovena Rubinstein is [phone].

## 2024-03-05 ENCOUNTER — Telehealth: Payer: Self-pay

## 2024-03-05 NOTE — Telephone Encounter (Signed)
 Spoke with pt pharmacy advised of Dx Code for pt Oxycodone 

## 2024-03-05 NOTE — Telephone Encounter (Signed)
 Good morning. Pt. Needs an appointment in order to get refill. She see Dr. Lovorn.

## 2024-03-08 NOTE — Therapy (Signed)
 OUTPATIENT PHYSICAL THERAPY NEURO TREATMENT   Patient Name: Dana [name] MRN: [id number] DOB:05/27/1966, 58 y.o., female Today's Date: 03/09/2024   PCP: Garnette Olmsted REFERRING PROVIDER: Garnette Olmsted  END OF SESSION:  PT End of Session - 03/09/24 1048     Visit Number 8    Date for PT Re-Evaluation 04/15/24    Authorization Type [employer]    PT Start Time 1055    PT Stop Time 1140    PT Time Calculation (min) 45 min                Past Medical History:  Diagnosis Date   Anxiety    Back pain with radiation    Breast mass, right    Chronic pain    Chronic, continuous use of opioids    Complication of anesthesia 04/07/14   Allergic reaction to Lisinopril  immediately following surgery   DJD (degenerative joint disease)    Fibromyalgia    Groin abscess    Headache(784.0)    History of IBS    Hypercholesterolemia    IBS (irritable bowel syndrome)    Lactose intolerance    Mild hypertension    Obesity    Tobacco use disorder    Umbilical hernia    Symptomatic   Past Surgical History:  Procedure Laterality Date   ABDOMINAL HYSTERECTOMY     ANTERIOR CERVICAL DECOMP/DISCECTOMY FUSION N/A 12/20/2015   Procedure: ANTERIOR CERVICAL DECOMPRESSION FUSION CERVICAL 4-5, CERVICAL 5-6, CERVICAL 6-7 WITH INSTRUMENTATION AND ALLOGRAFT;  Surgeon: Oneil Priestly, MD;  Location: [facility] OR;  Service: Orthopedics;  Laterality: N/A;  Anterior cervical decompression fusion, cervical 4-5, cervical 5-6, cervical 6-7 with instrumentation and allograft   BACK SURGERY     BILATERAL SALPINGECTOMY  09/03/2012   Procedure: BILATERAL SALPINGECTOMY;  Surgeon: Curlee VEAR Guan, MD;  Location: [facility] ORS;  Service: Gynecology;  Laterality: Bilateral;   BREAST BIOPSY Right 04/07/2014   Procedure: REMOVAL RIGHT BREAST MASS WITH WIRE LOCALIZATION;  Surgeon: Krystal JINNY Russell, MD;  Location: [facility] OR;  Service: General;  Laterality: Right;   BREAST EXCISIONAL BIOPSY Left    x2   BREAST LUMPECTOMY     x2   CARPAL  TUNNEL RELEASE Left 05/11/2019   Procedure: LEFT CARPAL TUNNEL RELEASE, RIGHT TENNIS ELBOW MARCAINE /DEPO MEDROL  INJECTION UNDER ANESTHESIA;  Surgeon: Lucilla Lynwood BRAVO, MD;  Location: [facility] OR;  Service: Orthopedics;  Laterality: Left;   COLONOSCOPY W/ BIOPSIES  04/24/2012   per Dr. Avram, clear, repeat in 10 yrs    disectomy     ESOPHAGOGASTRODUODENOSCOPY     FINGER SURGERY     Right index-excision of mass    FOOT SURGERY Right    Bone Spurs   IR FLUORO GUIDE CV LINE RIGHT  01/29/2023   IR REMOVAL TUN CV CATH W/O FL  02/08/2023   IR US  GUIDE VASC ACCESS RIGHT  01/31/2023   LAPAROSCOPIC HYSTERECTOMY  09/03/2012   Procedure: HYSTERECTOMY TOTAL LAPAROSCOPIC;  Surgeon: Curlee VEAR Guan, MD;  Location: [facility] ORS;  Service: Gynecology;  Laterality: N/A;   LUMBAR DISC SURGERY     TUBAL LIGATION     UMBILICAL HERNIA REPAIR N/A 05/01/2018   Procedure: UMBILICAL HERNIA REPAIR;  Surgeon: Kimble Lynwood, MD;  Location: [facility] OR;  Service: General;  Laterality: N/A;   Patient Active Problem List   Diagnosis Date Noted   Edema 11/20/2023   Hyperkeratosis of sole 08/01/2023   CIDP (chronic inflammatory demyelinating polyneuropathy) (HCC) 01/28/2023   Gait abnormality 01/28/2023   Glaucoma 01/28/2023  Pain due to onychomycosis of toenails of both feet 01/21/2023   Wheelchair dependence 12/30/2022   Neuropathic pain 12/30/2022   Insomnia due to medical condition 12/30/2022   Acute inflammatory demyelinating polyneuropathy (HCC) 10/01/2022   UTI (urinary tract infection) 09/24/2022   Hypomagnesemia 09/24/2022   Weakness 09/23/2022   Hypokalemia 09/23/2022   B12 deficiency 09/05/2022   Folate deficiency 09/05/2022   Carpal tunnel syndrome, left upper limb 05/11/2019    Class: Chronic   Spinal stenosis of lumbar region with neurogenic claudication 08/20/2018   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   Primary osteoarthritis of right knee 02/27/2017   Right tennis elbow 11/11/2016   Muscle cramps  01/15/2016   Bilateral leg edema 01/08/2016   Radiculopathy 12/20/2015   Hyperglycemia 12/21/2014   Mastodynia, female 11/30/2014   S/P excision of fibroadenoma of breast 11/30/2014   Angioedema of lips 04/07/2014   Fibroadenoma of right breast 03/07/2014   Pelvic pain in female 06/19/2012   Menorrhagia 06/11/2012   SUI (stress urinary incontinence, female) 06/11/2012   HTN (hypertension) 06/11/2012   IBS (irritable bowel syndrome) - diarrhea predominant 03/17/2012   MICROSCOPIC HEMATURIA 11/30/2010   BREAST PAIN, RIGHT 11/30/2010   BREAST MASS, RIGHT 01/12/2010   HYPERCHOLESTEROLEMIA 12/07/2007   CIGARETTE SMOKER 12/07/2007   Osteoarthritis 12/07/2007   Low back pain with sciatica 12/07/2007   Headache 12/07/2007   Obesity, Class III, BMI 40-49.9 (morbid obesity) 08/03/2007   Anxiety state 08/03/2007    ONSET DATE: 12/05/22  REFERRING DIAG:  G89.4 (ICD-10-CM) - Chronic pain syndrome    THERAPY DIAG:  Guillain Barr syndrome (HCC)  CIDP (chronic inflammatory demyelinating polyneuropathy) (HCC)  Other lack of coordination  Other abnormalities of gait and mobility  Muscle weakness (generalized)  Difficulty in walking, not elsewhere classified  Unsteadiness on feet  Rationale for Evaluation and Treatment: Rehabilitation  SUBJECTIVE:                                                                                                                                                                                             SUBJECTIVE STATEMENT: I have been slow and tired. The fusion was 2 weeks ago, and ever since then my legs feel like they don't want to move.    PERTINENT HISTORY: Pt Continuation for PT due to chronic inflammatory demyelinating polyneuropathy   01/28/23 ASSESSMENT AND PLAN   Dana [name] is a 58 y.o. female   Demyelinating polyradiculoneuropathy             Symptom onset monophasic since September 2024, failed to respond to IVIG in January  2024,  EMG nerve conduction study showed demyelinating features, no evidence of axonal loss,             Talk with neurohospitalist Dr. Jerrie, ED triage, will send her for hospital admission for plasma exchange, please add on following labs, immunofixative protein electrophoresis, ANA with reflex, SSA, SSB titer, iron panel             Starting outpatient IVIG prior authorization process,   Dana [name] is a 58 y.o. female who presented to the [facility] ED on 09/23/2022 with bilateral lower extremity weakness with decreased mobility that has progressed to both arms. She also reported numbness and tingling of the extremities. She was transferred to [facility] for MRI. 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  Inpatient Rehab F/B HHPT.   R knee injection 10/08/22 trigger point injections for worsening back pain 2/15  RA  PAIN:  Are you having pain? No  PRECAUTIONS: None  RED FLAGS: None   WEIGHT BEARING RESTRICTIONS: No  FALLS: Has patient fallen in last 6 months? No  LIVING ENVIRONMENT: Lives with: lives with their family Lives in: House/apartment Stairs: No Has following equipment at home: Environmental consultant - 2 wheeled and Wheelchair (power)  PLOF: Independent and Independent with basic ADLs  PATIENT GOALS: I want to get out this chair and put it down in the basement   OBJECTIVE:  Note: Objective measures were completed at Evaluation unless otherwise noted.  DIAGNOSTIC FINDINGS: Right knee radiographs 11/09/2020   FINDINGS: Severe patellofemoral joint space narrowing. Severe superior and mild inferior patellar degenerative osteophytosis. Moderate superior trochlear degenerative osteophytosis. No joint effusion. Severe medial compartment joint space narrowing with moderate peripheral medial and lateral compartment degenerative osteophytes.   COGNITION: Overall cognitive status: Within functional limits for tasks assessed   SENSATION: Light touch: still  has some numbness in L thigh down into the knee, toes are also numb a little bit   EDEMA: some BLE edema   POSTURE: rounded shoulders and forward head  LOWER EXTREMITY ROM:  grossly WFL for her means, able to move better than last time she was in PT    LOWER EXTREMITY MMT:    MMT Right Eval Left Eval  Hip flexion 4 4  Hip extension    Hip abduction 4 4  Hip adduction 4 4  Hip internal rotation    Hip external rotation    Knee flexion 4 4-  Knee extension 4- 4-  Ankle dorsiflexion    Ankle plantarflexion    Ankle inversion    Ankle eversion    (Blank rows = not tested)   TRANSFERS: Assistive device utilized: Environmental consultant - 2 wheeled and Wheelchair (power)  Sit to stand: Modified independence Stand to sit: Complete Independence Chair to chair: Modified independence  GAIT: Gait pattern: step through pattern, decreased stride length, wide BOS, poor foot clearance- Right, and poor foot clearance- Left Distance walked: in clinic distances Assistive device utilized: Environmental consultant - 2 wheeled Level of assistance: Modified independence Comments: she was able to take a few steps without the walker at eval  FUNCTIONAL TESTS:  5 times sit to stand: 13.14s from elevated mat table  Timed up and go (TUG): 29s with walker  TREATMENT DATE:  03/09/24 Recheck goals and recert Walking with walker 1 lap 148ft x2 5# LAQ and marching STS 2x10 NuStep L5x41mins    02/12/24 TUG with walker  NuStep L5x67mins  Seated calf to toe raises 2x12 STS 2x10 Box taps 4  Walking 1 lap with walker ~136ft   02/05/24 Transfer from power chair <> mat  LAQ 2x10 HS curls green 2x10 Seated chest press with 2# WATE 2x10 Seated AAROM 2# WATE shoulder flexion 2x10 Ball squeeze 2x10      01/20/24 Recheck 5xSTS goal Fitter pushes 2x10 STS with chest press 2# 2x8 Walking ~66ft from  table to NuStep w/o AD NuStep L5x28mins Taps to side of treadmill 20 reps alt  Walk from treadmill back to mat table without AD   01/08/24 Taking steps in parallel bars without UE use- forwards, backwards, side steps  Calf raises by steps 2x10  Box taps 6 holding on to railings  Walk across steps 2HRA but can alternate feet  NuStep L5x92mins    12/30/23 NuStep L5x48mins  Standing marching with RW Standing hip abd with RW 2x10 STS 2x10 Walking laps 132ft, 17ft    12/16/23- EVAL    PATIENT EDUCATION: Education details: POC, aquatic therapy  Person educated: Patient Education method: Explanation Education comprehension: verbalized understanding  HOME EXERCISE PROGRAM: [id number]   GOALS: Goals reviewed with patient? Yes  SHORT TERM GOALS: Target date: 01/20/24  Patient will be independent with initial HEP. Baseline:  Goal status: MET  2.  Patient will demonstrate improved functional LE strength as demonstrated by 5xSTS <12s. Baseline: 13.14s from elevated mat table  Goal status: 8.75s from elevated table MET 01/20/24   LONG TERM GOALS: Target date: 04/15/24  Patient will be independent with advanced/ongoing HEP to improve outcomes and carryover.  Baseline:  Goal status: INITIAL  2.  Patient will be able to ambulate 500' with LRAD with good safety to access community.  Baseline: can walk with RW Goal status: IN PROGRESS 03/09/24  3.  Patient will demonstrate decreased fall risk by scoring < 20 sec on TUG. Baseline: 29s Goal status: ongoing 44s w/walker, 31s w/walker 03/09/24  4.  Patient will be able to walk 83ft or more with no AD Baseline: took 10 steps modI  Goal status: IN PROGRESS did 59ft 01/20/24, unable to do today 03/09/24   5. Patient will increase standing tolerance to 30 mins or longer to complete household and kitchen chores  Baseline: before needing to sit  Goal status: ongoing 10 mins 03/09/24   ASSESSMENT:  CLINICAL IMPRESSION: Patient  returns with some ongoing weakness. She reports feeling worse since her infusion 2 weeks ago. With retesting today she has met little to no progress.   Continued to work on mostly functional strengthening and endurance. Was advised that if the swelling got worse or did not decrease in the next few days to see her doctor about it.    EVAL Patient is a 58 y.o. 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 She is currently still getting around in her power chair for the most part. She reports using the walker in her home, using it to get to the bathroom and in the kitchen. States her standing tolerance is about . Patient was able to take a few steps today without an assistive device. She has made great improvements from the last time she was at our clinic. She expresses interest in doing  aquatic therapy. We will set her up to do 1x land based and 1x a week aquatic therapy to be able to increase her standing and walking tolerance with and without an AD to improve her functional independence.   OBJECTIVE IMPAIRMENTS: Abnormal gait, decreased activity tolerance, decreased balance, decreased coordination, decreased endurance, difficulty walking, decreased ROM, decreased strength, postural dysfunction, and obesity.   ACTIVITY LIMITATIONS: carrying, lifting, squatting, stairs, transfers, and locomotion level  PARTICIPATION LIMITATIONS: cleaning, laundry, driving, shopping, and community activity  PERSONAL FACTORS: Fitness, Past/current experiences, Time since onset of injury/illness/exacerbation, and 1-2 comorbidities: chronic pain, GBS are also affecting patient's functional outcome.   REHAB POTENTIAL: Good  CLINICAL DECISION MAKING: Stable/uncomplicated  EVALUATION COMPLEXITY: Low  PLAN:  PT FREQUENCY: 1-2x/week  PT DURATION: 10 weeks  PLANNED INTERVENTIONS: 97110-Therapeutic  exercises, 97530- Therapeutic activity, [id number]- Neuromuscular re-education, 97535- Self Care, [id number]- Manual therapy, [phone]- Gait training, [phone]- Aquatic Therapy, Patient/Family education, Balance training, and Stair training  PLAN FOR NEXT SESSION: start with any functional tasks and working on gait and balance. Endurance training   [employer], PT 03/09/2024, 11:42 AM

## 2024-03-09 ENCOUNTER — Telehealth: Payer: Self-pay | Admitting: *Deleted

## 2024-03-09 ENCOUNTER — Ambulatory Visit: Attending: Family Medicine

## 2024-03-09 DIAGNOSIS — R2689 Other abnormalities of gait and mobility: Secondary | ICD-10-CM | POA: Diagnosis present

## 2024-03-09 DIAGNOSIS — M6281 Muscle weakness (generalized): Secondary | ICD-10-CM | POA: Diagnosis present

## 2024-03-09 DIAGNOSIS — G6181 Chronic inflammatory demyelinating polyneuritis: Secondary | ICD-10-CM | POA: Diagnosis present

## 2024-03-09 DIAGNOSIS — G610 Guillain-Barre syndrome: Secondary | ICD-10-CM | POA: Insufficient documentation

## 2024-03-09 DIAGNOSIS — R2681 Unsteadiness on feet: Secondary | ICD-10-CM | POA: Diagnosis present

## 2024-03-09 DIAGNOSIS — R262 Difficulty in walking, not elsewhere classified: Secondary | ICD-10-CM | POA: Diagnosis present

## 2024-03-09 DIAGNOSIS — R278 Other lack of coordination: Secondary | ICD-10-CM | POA: Diagnosis present

## 2024-03-09 NOTE — Telephone Encounter (Signed)
 Communication  Reason for CRM: patient called stating she is checking on her medication for HYDROmorphone 

## 2024-03-09 NOTE — Telephone Encounter (Signed)
 Reason for CRM: Patient would like to speak with nurse Inocente. Going through with a sore throat for about a week. Would like to speak regarding medication for it Please call [phone]

## 2024-03-10 ENCOUNTER — Encounter: Payer: Self-pay | Admitting: Family Medicine

## 2024-03-10 ENCOUNTER — Telehealth (INDEPENDENT_AMBULATORY_CARE_PROVIDER_SITE_OTHER): Admitting: Family Medicine

## 2024-03-10 DIAGNOSIS — J029 Acute pharyngitis, unspecified: Secondary | ICD-10-CM | POA: Diagnosis not present

## 2024-03-10 MED ORDER — GABAPENTIN 300 MG PO CAPS: 900.0000 mg | ORAL_CAPSULE | Freq: Three times a day (TID) | ORAL | 5 refills | Status: DC

## 2024-03-10 MED ORDER — CEFUROXIME AXETIL 500 MG PO TABS: 500.0000 mg | ORAL_TABLET | Freq: Two times a day (BID) | ORAL | 0 refills | Status: AC

## 2024-03-10 NOTE — Telephone Encounter (Signed)
 Patient has scheduled a VV 03/10/24.

## 2024-03-10 NOTE — Progress Notes (Signed)
 Subjective:    Patient ID: Dana [name], female    DOB: 07-02-1966, 58 y.o.   MRN: [id number]  HPI Virtual Visit via Video Note  I connected with the patient on 03/10/24 at  9:30 AM EDT by a video enabled telemedicine application and verified that I am speaking with the correct person using two identifiers.  Location patient: home Location provider:work or home office Persons participating in the virtual visit: patient, provider  I discussed the limitations of evaluation and management by telemedicine and the availability of in person appointments. The patient expressed understanding and agreed to proceed.   HPI: Here for 3 days of a ST. No fever or cough. No trouble swallowing.    ROS: See pertinent positives and negatives per HPI.  Past Medical History:  Diagnosis Date   Anxiety    Back pain with radiation    Breast mass, right    Chronic pain    Chronic, continuous use of opioids    Complication of anesthesia 04/07/14   Allergic reaction to Lisinopril  immediately following surgery   DJD (degenerative joint disease)    Fibromyalgia    Groin abscess    Headache(784.0)    History of IBS    Hypercholesterolemia    IBS (irritable bowel syndrome)    Lactose intolerance    Mild hypertension    Obesity    Tobacco use disorder    Umbilical hernia    Symptomatic    Past Surgical History:  Procedure Laterality Date   ABDOMINAL HYSTERECTOMY     ANTERIOR CERVICAL DECOMP/DISCECTOMY FUSION N/A 12/20/2015   Procedure: ANTERIOR CERVICAL DECOMPRESSION FUSION CERVICAL 4-5, CERVICAL 5-6, CERVICAL 6-7 WITH INSTRUMENTATION AND ALLOGRAFT;  Surgeon: Oneil Priestly, MD;  Location: [facility] OR;  Service: Orthopedics;  Laterality: N/A;  Anterior cervical decompression fusion, cervical 4-5, cervical 5-6, cervical 6-7 with instrumentation and allograft   BACK SURGERY     BILATERAL SALPINGECTOMY  09/03/2012   Procedure: BILATERAL SALPINGECTOMY;  Surgeon: Curlee VEAR Guan, MD;  Location: [facility];   Service: Gynecology;  Laterality: Bilateral;   BREAST BIOPSY Right 04/07/2014   Procedure: REMOVAL RIGHT BREAST MASS WITH WIRE LOCALIZATION;  Surgeon: Krystal JINNY Russell, MD;  Location: [facility] OR;  Service: General;  Laterality: Right;   BREAST EXCISIONAL BIOPSY Left    x2   BREAST LUMPECTOMY     x2   CARPAL TUNNEL RELEASE Left 05/11/2019   Procedure: LEFT CARPAL TUNNEL RELEASE, RIGHT TENNIS ELBOW MARCAINE /DEPO MEDROL  INJECTION UNDER ANESTHESIA;  Surgeon: Lucilla Lynwood BRAVO, MD;  Location: [facility] OR;  Service: Orthopedics;  Laterality: Left;   COLONOSCOPY W/ BIOPSIES  04/24/2012   per Dr. Avram, clear, repeat in 10 yrs    disectomy     ESOPHAGOGASTRODUODENOSCOPY     FINGER SURGERY     Right index-excision of mass    FOOT SURGERY Right    Bone Spurs   IR FLUORO GUIDE CV LINE RIGHT  01/29/2023   IR REMOVAL TUN CV CATH W/O FL  02/08/2023   IR US  GUIDE VASC ACCESS RIGHT  01/31/2023   LAPAROSCOPIC HYSTERECTOMY  09/03/2012   Procedure: HYSTERECTOMY TOTAL LAPAROSCOPIC;  Surgeon: Curlee VEAR Guan, MD;  Location: [facility];  Service: Gynecology;  Laterality: N/A;   LUMBAR DISC SURGERY     TUBAL LIGATION     UMBILICAL HERNIA REPAIR N/A 05/01/2018   Procedure: UMBILICAL HERNIA REPAIR;  Surgeon: Kimble Lynwood, MD;  Location: [facility] OR;  Service: General;  Laterality: N/A;    Family History  Problem Relation Age of Onset   Diabetes Mother    Diabetes Father    Breast cancer Maternal Aunt 46   Prostate cancer Paternal Grandfather      Current Outpatient Medications:    ALPRAZolam  (XANAX ) 0.5 MG tablet, Take 1 tablet (0.5 mg total) by mouth 2 (two) times daily as needed for anxiety., Disp: 180 tablet, Rfl: 1   ammonium lactate  (LAC-HYDRIN ) 12 % lotion, APPLY TOPICALLY IN THE MORNING AND AT BEDTIME, Disp: 400 mL, Rfl: 3   benzonatate  (TESSALON ) 200 MG capsule, TAKE 1 CAPSULE (200 MG TOTAL) BY MOUTH EVERY 6 (SIX) HOURS AS NEEDED FOR COUGH., Disp: 60 capsule, Rfl: 3   cefUROXime (CEFTIN) 500 MG tablet, Take 1 tablet  (500 mg total) by mouth 2 (two) times daily with a meal for 10 days., Disp: 20 tablet, Rfl: 0   celecoxib  (CELEBREX ) 100 MG capsule, Take 1 capsule (100 mg total) by mouth 2 (two) times daily., Disp: 180 capsule, Rfl: 3   cephALEXin  (KEFLEX ) 500 MG capsule, Take 1 capsule (500 mg total) by mouth 4 (four) times daily., Disp: 40 capsule, Rfl: 0   Cyanocobalamin  (VITAMIN B-12 PO), Take 1 tablet by mouth daily., Disp: , Rfl:    cyclobenzaprine  (FLEXERIL ) 10 MG tablet, TAKE 1 TABLET BY MOUTH THREE TIMES A DAY, Disp: 90 tablet, Rfl: 5   dorzolamide -timolol  (COSOPT ) 2-0.5 % ophthalmic solution, Place 1 drop into both eyes 2 (two) times daily., Disp: 10 mL, Rfl: 1   DULoxetine  (CYMBALTA ) 60 MG capsule, TAKE 1 CAPSULE BY MOUTH EVERY DAY, Disp: 90 capsule, Rfl: 4   famotidine  (PEPCID ) 20 MG tablet, Take 20 mg by mouth 2 (two) times daily., Disp: , Rfl:    fluticasone  (FLONASE ) 50 MCG/ACT nasal spray, SPRAY 2 SPRAYS INTO EACH NOSTRIL EVERY DAY, Disp: 16 mL, Rfl: 11   folic acid  (FOLVITE ) 1 MG tablet, TAKE 1 TABLET BY MOUTH EVERY DAY, Disp: 90 tablet, Rfl: 3   furosemide  (LASIX ) 40 MG tablet, TAKE 1 TABLET BY MOUTH TWICE A DAY, Disp: 180 tablet, Rfl: 3   gabapentin  (NEURONTIN ) 300 MG capsule, Take 3 capsules (900 mg total) by mouth 3 (three) times daily., Disp: 270 capsule, Rfl: 5   hydrocerin (EUCERIN) CREA, Apply 1 Application topically 2 (two) times daily., Disp: , Rfl: 0   ibuprofen  (ADVIL ) 200 MG tablet, Take 400 mg by mouth 2 (two) times daily as needed for headache or moderate pain., Disp: , Rfl:    Immune Globulin , Human, (OCTAGAM IV), Inject 85 g into the vein every 21 ( twenty-one) days. 10% octagam, Disp: , Rfl:    latanoprost  (XALATAN ) 0.005 % ophthalmic solution, Place 1 drop into both eyes at bedtime., Disp: 2.5 mL, Rfl: 1   magnesium  oxide (MAG-OX) 400 MG tablet, Take 1 tablet (400 mg total) by mouth at bedtime. (Patient taking differently: Take 200 mg by mouth at bedtime.), Disp: 90 tablet, Rfl:  3   metoprolol  tartrate (LOPRESSOR ) 50 MG tablet, TAKE 1 TABLET BY MOUTH TWICE A DAY, Disp: 180 tablet, Rfl: 3   Multiple Vitamin (MULTIVITAMIN WITH MINERALS) TABS tablet, Take 1 tablet by mouth daily., Disp: 30 tablet, Rfl: 0   Oxycodone  HCl 20 MG TABS, Take 1 tablet (20 mg total) by mouth every 6 (six) hours as needed., Disp: 120 tablet, Rfl: 0   pantoprazole  (PROTONIX ) 40 MG tablet, TAKE 1 TABLET BY MOUTH EVERY DAY, Disp: 90 tablet, Rfl: 3   potassium chloride  (KLOR-CON ) 10 MEQ tablet, TAKE 1 TABLET BY MOUTH 2  TIMES DAILY., Disp: 180 tablet, Rfl: 2   promethazine  (PHENERGAN ) 25 MG tablet, TAKE 1 TABLET (25 MG TOTAL) BY MOUTH EVERY 4 HOURS AS NEEDED FOR NAUSEA AND VOMITING, Disp: 60 tablet, Rfl: 5   senna-docusate (SENOKOT-S) 8.6-50 MG tablet, Take 1 tablet by mouth 2 (two) times daily. (Patient taking differently: Take 1 tablet by mouth 2 (two) times daily as needed for moderate constipation.), Disp: , Rfl:    traZODone  (DESYREL ) 50 MG tablet, TAKE 1 TO 2 TABLETS BY MOUTH AT BEDTIME AS NEEDED FOR SLEEP, Disp: 180 tablet, Rfl: 1  EXAM:  VITALS per patient if applicable:  GENERAL: alert, oriented, appears well and in no acute distress  HEENT: atraumatic, conjunttiva clear, no obvious abnormalities on inspection of external nose and ears  NECK: normal movements of the head and neck  LUNGS: on inspection no signs of respiratory distress, breathing rate appears normal, no obvious gross SOB, gasping or wheezing  CV: no obvious cyanosis  MS: moves all visible extremities without noticeable abnormality  PSYCH/NEURO: pleasant and cooperative, no obvious depression or anxiety, speech and thought processing grossly intact  ASSESSMENT AND PLAN: Pharyngitis, treat with 10 days of Cefuroxime .  Garnette Olmsted, MD  Discussed the following assessment and plan:  No diagnosis found.     I discussed the assessment and treatment plan with the patient. The patient was provided an opportunity to ask  questions and all were answered. The patient agreed with the plan and demonstrated an understanding of the instructions.   The patient was advised to call back or seek an in-person evaluation if the symptoms worsen or if the condition fails to improve as anticipated.      Review of Systems     Objective:   Physical Exam        Assessment & Plan:

## 2024-03-10 NOTE — Therapy (Incomplete)
 OUTPATIENT PHYSICAL THERAPY NEURO TREATMENT   Patient Name: Dana [name] MRN: [id number] DOB:1966-06-30, 58 y.o., female Today's Date: 03/10/2024   PCP: Garnette Olmsted REFERRING PROVIDER: Garnette Olmsted  END OF SESSION:          Past Medical History:  Diagnosis Date   Anxiety    Back pain with radiation    Breast mass, right    Chronic pain    Chronic, continuous use of opioids    Complication of anesthesia 04/07/14   Allergic reaction to Lisinopril  immediately following surgery   DJD (degenerative joint disease)    Fibromyalgia    Groin abscess    Headache(784.0)    History of IBS    Hypercholesterolemia    IBS (irritable bowel syndrome)    Lactose intolerance    Mild hypertension    Obesity    Tobacco use disorder    Umbilical hernia    Symptomatic   Past Surgical History:  Procedure Laterality Date   ABDOMINAL HYSTERECTOMY     ANTERIOR CERVICAL DECOMP/DISCECTOMY FUSION N/A 12/20/2015   Procedure: ANTERIOR CERVICAL DECOMPRESSION FUSION CERVICAL 4-5, CERVICAL 5-6, CERVICAL 6-7 WITH INSTRUMENTATION AND ALLOGRAFT;  Surgeon: Oneil Priestly, MD;  Location: [facility] OR;  Service: Orthopedics;  Laterality: N/A;  Anterior cervical decompression fusion, cervical 4-5, cervical 5-6, cervical 6-7 with instrumentation and allograft   BACK SURGERY     BILATERAL SALPINGECTOMY  09/03/2012   Procedure: BILATERAL SALPINGECTOMY;  Surgeon: Curlee VEAR Guan, MD;  Location: [facility];  Service: Gynecology;  Laterality: Bilateral;   BREAST BIOPSY Right 04/07/2014   Procedure: REMOVAL RIGHT BREAST MASS WITH WIRE LOCALIZATION;  Surgeon: Krystal JINNY Russell, MD;  Location[facility] OR;  Service: General;  Laterality: Right;   BREAST EXCISIONAL BIOPSY Left    x2   BREAST LUMPECTOMY     x2   CARPAL TUNNEL RELEASE Left 05/11/2019   Procedure: LEFT CARPAL TUNNEL RELEASE, RIGHT TENNIS ELBOW MARCAINE /DEPO MEDROL  INJECTION UNDER ANESTHESIA;  Surgeon: Lucilla Lynwood BRAVO, MD;  Location: [facility] OR;  Service: Orthopedics;   Laterality: Left;   COLONOSCOPY W/ BIOPSIES  04/24/2012   per Dr. Avram, clear, repeat in 10 yrs    disectomy     ESOPHAGOGASTRODUODENOSCOPY     FINGER SURGERY     Right index-excision of mass    FOOT SURGERY Right    Bone Spurs   IR FLUORO GUIDE CV LINE RIGHT  01/29/2023   IR REMOVAL TUN CV CATH W/O FL  02/08/2023   IR US  GUIDE VASC ACCESS RIGHT  01/31/2023   LAPAROSCOPIC HYSTERECTOMY  09/03/2012   Procedure: HYSTERECTOMY TOTAL LAPAROSCOPIC;  Surgeon: Curlee VEAR Guan, MD;  Location: [facility];  Service: Gynecology;  Laterality: N/A;   LUMBAR DISC SURGERY     TUBAL LIGATION     UMBILICAL HERNIA REPAIR N/A 05/01/2018   Procedure: UMBILICAL HERNIA REPAIR;  Surgeon: Kimble Lynwood, MD;  Location: [facility] OR;  Service: General;  Laterality: N/A;   Patient Active Problem List   Diagnosis Date Noted   Edema 11/20/2023   Hyperkeratosis of sole 08/01/2023   CIDP (chronic inflammatory demyelinating polyneuropathy) (HCC) 01/28/2023   Gait abnormality 01/28/2023   Glaucoma 01/28/2023   Pain due to onychomycosis of toenails of both feet 01/21/2023   Wheelchair dependence 12/30/2022   Neuropathic pain 12/30/2022   Insomnia due to medical condition 12/30/2022   Acute inflammatory demyelinating polyneuropathy (HCC) 10/01/2022   UTI (urinary tract infection) 09/24/2022   Hypomagnesemia 09/24/2022   Weakness 09/23/2022   Hypokalemia 09/23/2022  B12 deficiency 09/05/2022   Folate deficiency 09/05/2022   Carpal tunnel syndrome, left upper limb 05/11/2019    Class: Chronic   Spinal stenosis of lumbar region with neurogenic claudication 08/20/2018   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   Primary osteoarthritis of right knee 02/27/2017   Right tennis elbow 11/11/2016   Muscle cramps 01/15/2016   Bilateral leg edema 01/08/2016   Radiculopathy 12/20/2015   Hyperglycemia 12/21/2014   Mastodynia, female 11/30/2014   S/P excision of fibroadenoma of breast 11/30/2014   Angioedema of lips 04/07/2014    Fibroadenoma of right breast 03/07/2014   Pelvic pain in female 06/19/2012   Menorrhagia 06/11/2012   SUI (stress urinary incontinence, female) 06/11/2012   HTN (hypertension) 06/11/2012   IBS (irritable bowel syndrome) - diarrhea predominant 03/17/2012   MICROSCOPIC HEMATURIA 11/30/2010   BREAST PAIN, RIGHT 11/30/2010   BREAST MASS, RIGHT 01/12/2010   HYPERCHOLESTEROLEMIA 12/07/2007   CIGARETTE SMOKER 12/07/2007   Osteoarthritis 12/07/2007   Low back pain with sciatica 12/07/2007   Headache 12/07/2007   Obesity, Class III, BMI 40-49.9 (morbid obesity) 08/03/2007   Anxiety state 08/03/2007    ONSET DATE: 12/05/22  REFERRING DIAG:  G89.4 (ICD-10-CM) - Chronic pain syndrome    THERAPY DIAG:  No diagnosis found.  Rationale for Evaluation and Treatment: Rehabilitation  SUBJECTIVE:                                                                                                                                                                                             SUBJECTIVE STATEMENT: I have been slow and tired. The fusion was 2 weeks ago, and ever since then my legs feel like they don't want to move.    PERTINENT HISTORY: Pt Continuation for PT due to chronic inflammatory demyelinating polyneuropathy   01/28/23 ASSESSMENT AND PLAN   Dana [name] is a 59 y.o. female   Demyelinating polyradiculoneuropathy             Symptom onset monophasic since September 2024, failed to respond to IVIG in January 2024,             EMG nerve conduction study showed demyelinating features, no evidence of axonal loss,             Talk with neurohospitalist Dr. Jerrie, ED triage, will send her for hospital admission for plasma exchange, please add on following labs, immunofixative protein electrophoresis, ANA with reflex, SSA, SSB titer, iron panel             Starting outpatient IVIG prior authorization process,   Dana [name] is a 58 y.o. female who presented  to the [facility] ED on 09/23/2022 with bilateral lower extremity weakness with decreased mobility that has progressed to both arms. She also reported numbness and tingling of the extremities. She was transferred to [facility] for MRI. 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  Inpatient Rehab F/B HHPT.   R knee injection 10/08/22 trigger point injections for worsening back pain 2/15  RA  PAIN:  Are you having pain? No  PRECAUTIONS: None  RED FLAGS: None   WEIGHT BEARING RESTRICTIONS: No  FALLS: Has patient fallen in last 6 months? No  LIVING ENVIRONMENT: Lives with: lives with their family Lives in: House/apartment Stairs: No Has following equipment at home: Environmental consultant - 2 wheeled and Wheelchair (power)  PLOF: Independent and Independent with basic ADLs  PATIENT GOALS: I want to get out this chair and put it down in the basement   OBJECTIVE:  Note: Objective measures were completed at Evaluation unless otherwise noted.  DIAGNOSTIC FINDINGS: Right knee radiographs 11/09/2020   FINDINGS: Severe patellofemoral joint space narrowing. Severe superior and mild inferior patellar degenerative osteophytosis. Moderate superior trochlear degenerative osteophytosis. No joint effusion. Severe medial compartment joint space narrowing with moderate peripheral medial and lateral compartment degenerative osteophytes.   COGNITION: Overall cognitive status: Within functional limits for tasks assessed   SENSATION: Light touch: still has some numbness in L thigh down into the knee, toes are also numb a little bit   EDEMA: some BLE edema   POSTURE: rounded shoulders and forward head  LOWER EXTREMITY ROM:  grossly WFL for her means, able to move better than last time she was in PT    LOWER EXTREMITY MMT:    MMT Right Eval Left Eval  Hip flexion 4 4  Hip extension    Hip abduction 4 4  Hip adduction 4 4  Hip internal rotation    Hip external rotation    Knee flexion 4 4-  Knee extension  4- 4-  Ankle dorsiflexion    Ankle plantarflexion    Ankle inversion    Ankle eversion    (Blank rows = not tested)   TRANSFERS: Assistive device utilized: Environmental consultant - 2 wheeled and Wheelchair (power)  Sit to stand: Modified independence Stand to sit: Complete Independence Chair to chair: Modified independence  GAIT: Gait pattern: step through pattern, decreased stride length, wide BOS, poor foot clearance- Right, and poor foot clearance- Left Distance walked: in clinic distances Assistive device utilized: Environmental consultant - 2 wheeled Level of assistance: Modified independence Comments: she was able to take a few steps without the walker at eval  FUNCTIONAL TESTS:  5 times sit to stand: 13.14s from elevated mat table  Timed up and go (TUG): 29s with walker                                                                                                                              TREATMENT DATE:  03/11/24 Box taps  Side steps  in front of mat table  Ball squeeze  Walking with walker- 2 laps 169ft no breaks  Shoulder ext 5#    03/09/24 Recheck goals and recert Walking with walker 1 lap 168ft x2 5# LAQ and marching STS 2x10 NuStep L5x94mins    02/12/24 TUG with walker  NuStep L5x87mins  Seated calf to toe raises 2x12 STS 2x10 Box taps 4  Walking 1 lap with walker ~111ft   02/05/24 Transfer from power chair <> mat  LAQ 2x10 HS curls green 2x10 Seated chest press with 2# WATE 2x10 Seated AAROM 2# WATE shoulder flexion 2x10 Ball squeeze 2x10      01/20/24 Recheck 5xSTS goal Fitter pushes 2x10 STS with chest press 2# 2x8 Walking ~94ft from table to NuStep w/o AD NuStep L5x68mins Taps to side of treadmill 20 reps alt  Walk from treadmill back to mat table without AD   01/08/24 Taking steps in parallel bars without UE use- forwards, backwards, side steps  Calf raises by steps 2x10  Box taps 6 holding on to railings  Walk across steps 2HRA but can alternate feet  NuStep  L5x34mins    12/30/23 NuStep L5x29mins  Standing marching with RW Standing hip abd with RW 2x10 STS 2x10 Walking laps 15ft, 149ft    12/16/23- EVAL    PATIENT EDUCATION: Education details: POC, aquatic therapy  Person educated: Patient Education method: Explanation Education comprehension: verbalized understanding  HOME EXERCISE PROGRAM: [id number]   GOALS: Goals reviewed with patient? Yes  SHORT TERM GOALS: Target date: 01/20/24  Patient will be independent with initial HEP. Baseline:  Goal status: MET  2.  Patient will demonstrate improved functional LE strength as demonstrated by 5xSTS <12s. Baseline: 13.14s from elevated mat table  Goal status: 8.75s from elevated table MET 01/20/24   LONG TERM GOALS: Target date: 04/15/24  Patient will be independent with advanced/ongoing HEP to improve outcomes and carryover.  Baseline:  Goal status: INITIAL  2.  Patient will be able to ambulate 500' with LRAD with good safety to access community.  Baseline: can walk with RW Goal status: IN PROGRESS 03/09/24  3.  Patient will demonstrate decreased fall risk by scoring < 20 sec on TUG. Baseline: 29s Goal status: ongoing 44s w/walker, 31s w/walker 03/09/24  4.  Patient will be able to walk 35ft or more with no AD Baseline: took 10 steps modI  Goal status: IN PROGRESS did 35ft 01/20/24, unable to do today 03/09/24   5. Patient will increase standing tolerance to 30 mins or longer to complete household and kitchen chores  Baseline: before needing to sit  Goal status: ongoing 10 mins 03/09/24   ASSESSMENT:  CLINICAL IMPRESSION: Patient returns with some ongoing weakness. She reports feeling worse since her infusion 2 weeks ago. With retesting today she has met little to no progress.  Continued to work on mostly functional strengthening and endurance.    EVAL Patient is a 58 y.o. female who was seen today for physical therapy evaluation and treatment for chronic  inflammatory demyelinating polyneuropathy and Guillain-Burre syndrome. She is currently still getting around in her power chair for the most part. She reports using the walker in her home, using it to get to the bathroom and in the kitchen. States her standing tolerance is about . Patient was able to take a few steps today without an assistive device. She has made great improvements from the last time she was at our clinic. She expresses interest in doing aquatic therapy. We  will set her up to do 1x land based and 1x a week aquatic therapy to be able to increase her standing and walking tolerance with and without an AD to improve her functional independence.   OBJECTIVE IMPAIRMENTS: Abnormal gait, decreased activity tolerance, decreased balance, decreased coordination, decreased endurance, difficulty walking, decreased ROM, decreased strength, postural dysfunction, and obesity.   ACTIVITY LIMITATIONS: carrying, lifting, squatting, stairs, transfers, and locomotion level  PARTICIPATION LIMITATIONS: cleaning, laundry, driving, shopping, and community activity  PERSONAL FACTORS: Fitness, Past/current experiences, Time since onset of injury/illness/exacerbation, and 1-2 comorbidities: chronic pain, GBS are also affecting patient's functional outcome.   REHAB POTENTIAL: Good  CLINICAL DECISION MAKING: Stable/uncomplicated  EVALUATION COMPLEXITY: Low  PLAN:  PT FREQUENCY: 1-2x/week  PT DURATION: 10 weeks  PLANNED INTERVENTIONS: 97110-Therapeutic exercises, 97530- Therapeutic activity, [id number]- Neuromuscular re-education, 97535- Self Care, [id number]- Manual therapy, [phone]- Gait training, [phone]- Aquatic Therapy, Patient/Family education, Balance training, and Stair training  PLAN FOR NEXT SESSION: start with any functional tasks and working on gait and balance. Endurance training   [employer], PT 03/10/2024, 3:58 PM

## 2024-03-10 NOTE — Telephone Encounter (Signed)
 Pt has  VV appointment with Dr Johnny today at 9.30 am

## 2024-03-11 ENCOUNTER — Ambulatory Visit

## 2024-03-16 ENCOUNTER — Ambulatory Visit: Admitting: Rehabilitation

## 2024-03-16 NOTE — Therapy (Signed)
 OUTPATIENT PHYSICAL THERAPY NEURO TREATMENT   Patient Name: Dana [name] MRN: [id number] DOB:[birth year], 58 y.o., female Today's Date: 03/18/2024   PCP: Dana [name] REFERRING PROVIDER: Garnette [name]  END OF SESSION:  PT End of Session - 03/18/24 1100     Visit Number 9    Date for PT Re-Evaluation 04/15/24    Authorization Type [employer]    PT Start Time 1100    PT Stop Time 1145    PT Time Calculation (min) 45 min                 Past Medical History:  Diagnosis Date   Anxiety    Back pain with radiation    Breast mass, right    Chronic pain    Chronic, continuous use of opioids    Complication of anesthesia 04/07/14   Allergic reaction to Lisinopril  immediately following surgery   DJD (degenerative joint disease)    Fibromyalgia    Groin abscess    Headache(784.0)    History of IBS    Hypercholesterolemia    IBS (irritable bowel syndrome)    Lactose intolerance    Mild hypertension    Obesity    Tobacco use disorder    Umbilical hernia    Symptomatic   Past Surgical History:  Procedure Laterality Date   ABDOMINAL HYSTERECTOMY     ANTERIOR CERVICAL DECOMP/DISCECTOMY FUSION N/A 12/20/2015   Procedure: ANTERIOR CERVICAL DECOMPRESSION FUSION CERVICAL 4-5, CERVICAL 5-6, CERVICAL 6-7 WITH INSTRUMENTATION AND ALLOGRAFT;  Surgeon: Dana Priestly, MD;  Location: [facility] OR;  Service: Orthopedics;  Laterality: N/A;  Anterior cervical decompression fusion, cervical 4-5, cervical 5-6, cervical 6-7 with instrumentation and allograft   BACK SURGERY     BILATERAL SALPINGECTOMY  09/03/2012   Procedure: BILATERAL SALPINGECTOMY;  Surgeon: Dana VEAR Guan, MD;  Location: [facility];  Service: Gynecology;  Laterality: Bilateral;   BREAST BIOPSY Right 04/07/2014   Procedure: REMOVAL RIGHT BREAST MASS WITH WIRE LOCALIZATION;  Surgeon: Dana JINNY Russell, MD;  Location: [facility] OR;  Service: General;  Laterality: Right;   BREAST EXCISIONAL BIOPSY Left    x2   BREAST LUMPECTOMY     x2   CARPAL  TUNNEL RELEASE Left 05/11/2019   Procedure: LEFT CARPAL TUNNEL RELEASE, RIGHT TENNIS ELBOW MARCAINE /DEPO MEDROL  INJECTION UNDER ANESTHESIA;  Surgeon: Dana [name] BRAVO, MD;  Location: [facility] OR;  Service: Orthopedics;  Laterality: Left;   COLONOSCOPY W/ BIOPSIES  04/24/2012   per Dr. Avram, clear, repeat in 10 yrs    disectomy     ESOPHAGOGASTRODUODENOSCOPY     FINGER SURGERY     Right index-excision of mass    FOOT SURGERY Right    Bone Spurs   IR FLUORO GUIDE CV LINE RIGHT  01/29/2023   IR REMOVAL TUN CV CATH W/O FL  02/08/2023   IR US  GUIDE VASC ACCESS RIGHT  01/31/2023   LAPAROSCOPIC HYSTERECTOMY  09/03/2012   Procedure: HYSTERECTOMY TOTAL LAPAROSCOPIC;  Surgeon: Dana VEAR Guan, MD;  Location: [facility];  Service: Gynecology;  Laterality: N/A;   LUMBAR DISC SURGERY     TUBAL LIGATION     UMBILICAL HERNIA REPAIR N/A 05/01/2018   Procedure: UMBILICAL HERNIA REPAIR;  Surgeon: Dana Lynwood, MD;  Location: [facility] OR;  Service: General;  Laterality: N/A;   Patient Active Problem List   Diagnosis Date Noted   Edema 11/20/2023   Hyperkeratosis of sole 08/01/2023   CIDP (chronic inflammatory demyelinating polyneuropathy) (HCC) 01/28/2023   Gait abnormality 01/28/2023   Glaucoma  01/28/2023   Pain due to onychomycosis of toenails of both feet 01/21/2023   Wheelchair dependence 12/30/2022   Neuropathic pain 12/30/2022   Insomnia due to medical condition 12/30/2022   Acute inflammatory demyelinating polyneuropathy (HCC) 10/01/2022   UTI (urinary tract infection) 09/24/2022   Hypomagnesemia 09/24/2022   Weakness 09/23/2022   Hypokalemia 09/23/2022   B12 deficiency 09/05/2022   Folate deficiency 09/05/2022   Carpal tunnel syndrome, left upper limb 05/11/2019    Class: Chronic   Spinal stenosis of lumbar region with neurogenic claudication 08/20/2018   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   Primary osteoarthritis of right knee 02/27/2017   Right tennis elbow 11/11/2016   Muscle cramps  01/15/2016   Bilateral leg edema 01/08/2016   Radiculopathy 12/20/2015   Hyperglycemia 12/21/2014   Mastodynia, female 11/30/2014   S/P excision of fibroadenoma of breast 11/30/2014   Angioedema of lips 04/07/2014   Fibroadenoma of right breast 03/07/2014   Pelvic pain in female 06/19/2012   Menorrhagia 06/11/2012   SUI (stress urinary incontinence, female) 06/11/2012   HTN (hypertension) 06/11/2012   IBS (irritable bowel syndrome) - diarrhea predominant 03/17/2012   MICROSCOPIC HEMATURIA 11/30/2010   BREAST PAIN, RIGHT 11/30/2010   BREAST MASS, RIGHT 01/12/2010   HYPERCHOLESTEROLEMIA 12/07/2007   CIGARETTE SMOKER 12/07/2007   Osteoarthritis 12/07/2007   Low back pain with sciatica 12/07/2007   Headache 12/07/2007   Obesity, Class III, BMI 40-49.9 (morbid obesity) 08/03/2007   Anxiety state 08/03/2007    ONSET DATE: 12/05/22  REFERRING DIAG:  G89.4 (ICD-10-CM) - Chronic pain syndrome    THERAPY DIAG:  Guillain Barr syndrome (HCC)  CIDP (chronic inflammatory demyelinating polyneuropathy) (HCC)  Other abnormalities of gait and mobility  Other lack of coordination  Difficulty in walking, not elsewhere classified  Rationale for Evaluation and Treatment: Rehabilitation  SUBJECTIVE:                                                                                                                                                                                             SUBJECTIVE STATEMENT: We can't do anything walking. The left side of my foot is hurting so bad. I think I broke my middle toe, it rammed into the fridge and I cannot bend it.    PERTINENT HISTORY: Pt Continuation for PT due to chronic inflammatory demyelinating polyneuropathy   01/28/23 ASSESSMENT AND PLAN   Dana [name] is a 58 y.o. female   Demyelinating polyradiculoneuropathy             Symptom onset monophasic since September 2024, failed to respond to IVIG in January 2024,  EMG nerve  conduction study showed demyelinating features, no evidence of axonal loss,             Talk with neurohospitalist Dr. Jerrie, ED triage, will send her for hospital admission for plasma exchange, please add on following labs, immunofixative protein electrophoresis, ANA with reflex, SSA, SSB titer, iron panel             Starting outpatient IVIG prior authorization process,   TYKERIA WAWRZYNIAK is a 58 y.o. female who presented to the [facility] ED on 09/23/2022 with bilateral lower extremity weakness with decreased mobility that has progressed to both arms. She also reported numbness and tingling of the extremities. She was transferred to [facility] for MRI. 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  Inpatient Rehab F/B HHPT.   R knee injection 10/08/22 trigger point injections for worsening back pain 2/15  RA  PAIN:  Are you having pain? No  PRECAUTIONS: None  RED FLAGS: None   WEIGHT BEARING RESTRICTIONS: No  FALLS: Has patient fallen in last 6 months? No  LIVING ENVIRONMENT: Lives with: lives with their family Lives in: House/apartment Stairs: No Has following equipment at home: Environmental consultant - 2 wheeled and Wheelchair (power)  PLOF: Independent and Independent with basic ADLs  PATIENT GOALS: I want to get out this chair and put it down in the basement   OBJECTIVE:  Note: Objective measures were completed at Evaluation unless otherwise noted.  DIAGNOSTIC FINDINGS: Right knee radiographs 11/09/2020   FINDINGS: Severe patellofemoral joint space narrowing. Severe superior and mild inferior patellar degenerative osteophytosis. Moderate superior trochlear degenerative osteophytosis. No joint effusion. Severe medial compartment joint space narrowing with moderate peripheral medial and lateral compartment degenerative osteophytes.   COGNITION: Overall cognitive status: Within functional limits for tasks assessed   SENSATION: Light touch: still has some numbness in L thigh  down into the knee, toes are also numb a little bit   EDEMA: some BLE edema   POSTURE: rounded shoulders and forward head  LOWER EXTREMITY ROM:  grossly WFL for her means, able to move better than last time she was in PT    LOWER EXTREMITY MMT:    MMT Right Eval Left Eval  Hip flexion 4 4  Hip extension    Hip abduction 4 4  Hip adduction 4 4  Hip internal rotation    Hip external rotation    Knee flexion 4 4-  Knee extension 4- 4-  Ankle dorsiflexion    Ankle plantarflexion    Ankle inversion    Ankle eversion    (Blank rows = not tested)   TRANSFERS: Assistive device utilized: Environmental consultant - 2 wheeled and Wheelchair (power)  Sit to stand: Modified independence Stand to sit: Complete Independence Chair to chair: Modified independence  GAIT: Gait pattern: step through pattern, decreased stride length, wide BOS, poor foot clearance- Right, and poor foot clearance- Left Distance walked: in clinic distances Assistive device utilized: Environmental consultant - 2 wheeled Level of assistance: Modified independence Comments: she was able to take a few steps without the walker at eval  FUNCTIONAL TESTS:  5 times sit to stand: 13.14s from elevated mat table  Timed up and go (TUG): 29s with walker  TREATMENT DATE:  03/18/24 OHP yellow ball x10 Seated horizontal abd red 2x10 Seated box taps 3# 2x10, then laterally  Ball squeeze 2x10 Seated march  Transfer from chair to mat and back  03/09/24 Recheck goals and recert Walking with walker 1 lap 197ft x2 5# LAQ and marching STS 2x10 NuStep L5x28mins    02/12/24 TUG with walker  NuStep L5x41mins  Seated calf to toe raises 2x12 STS 2x10 Box taps 4  Walking 1 lap with walker ~189ft   02/05/24 Transfer from power chair <> mat  LAQ 2x10 HS curls green 2x10 Seated chest press with 2# WATE 2x10 Seated AAROM 2# WATE  shoulder flexion 2x10 Ball squeeze 2x10      01/20/24 Recheck 5xSTS goal Fitter pushes 2x10 STS with chest press 2# 2x8 Walking ~98ft from table to NuStep w/o AD NuStep L5x34mins Taps to side of treadmill 20 reps alt  Walk from treadmill back to mat table without AD   01/08/24 Taking steps in parallel bars without UE use- forwards, backwards, side steps  Calf raises by steps 2x10  Box taps 6 holding on to railings  Walk across steps 2HRA but can alternate feet  NuStep L5x39mins    12/30/23 NuStep L5x50mins  Standing marching with RW Standing hip abd with RW 2x10 STS 2x10 Walking laps 155ft, 152ft    12/16/23- EVAL    PATIENT EDUCATION: Education details: POC, aquatic therapy  Person educated: Patient Education method: Explanation Education comprehension: verbalized understanding  HOME EXERCISE PROGRAM: [id number]   GOALS: Goals reviewed with patient? Yes  SHORT TERM GOALS: Target date: 01/20/24  Patient will be independent with initial HEP. Baseline:  Goal status: MET  2.  Patient will demonstrate improved functional LE strength as demonstrated by 5xSTS <12s. Baseline: 13.14s from elevated mat table  Goal status: 8.75s from elevated table MET 01/20/24   LONG TERM GOALS: Target date: 04/15/24  Patient will be independent with advanced/ongoing HEP to improve outcomes and carryover.  Baseline:  Goal status: INITIAL  2.  Patient will be able to ambulate 500' with LRAD with good safety to access community.  Baseline: can walk with RW Goal status: IN PROGRESS 03/09/24  3.  Patient will demonstrate decreased fall risk by scoring < 20 sec on TUG. Baseline: 29s Goal status: ongoing 44s w/walker, 31s w/walker 03/09/24  4.  Patient will be able to walk 47ft or more with no AD Baseline: took 10 steps modI  Goal status: IN PROGRESS did 71ft 01/20/24, unable to do today 03/09/24   5. Patient will increase standing tolerance to 30 mins or longer to complete household and  kitchen chores  Baseline: before needing to sit  Goal status: ongoing 10 mins 03/09/24   ASSESSMENT:  CLINICAL IMPRESSION: Patient returns with more trouble walking today due to pain in her R middle toe and on the lateral L side of her foot. Putting weight through her feet is painful. She requests we not do walking today so we focus on mostly seated exercises and incorporated some UE strengthening. Taking a few steps from the walker to the mat table was very difficult.   EVAL Patient is a 58 y.o. 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 She is currently still getting around in her power chair for the most part. She reports using the walker in her home, using it to get to the bathroom and in the kitchen. States her standing tolerance  is about . Patient was able to take a few steps today without an assistive device. She has made great improvements from the last time she was at our clinic. She expresses interest in doing aquatic therapy. We will set her up to do 1x land based and 1x a week aquatic therapy to be able to increase her standing and walking tolerance with and without an AD to improve her functional independence.   OBJECTIVE IMPAIRMENTS: Abnormal gait, decreased activity tolerance, decreased balance, decreased coordination, decreased endurance, difficulty walking, decreased ROM, decreased strength, postural dysfunction, and obesity.   ACTIVITY LIMITATIONS: carrying, lifting, squatting, stairs, transfers, and locomotion level  PARTICIPATION LIMITATIONS: cleaning, laundry, driving, shopping, and community activity  PERSONAL FACTORS: Fitness, Past/current experiences, Time since onset of injury/illness/exacerbation, and 1-2 comorbidities: chronic pain, GBS are also affecting patient's functional outcome.   REHAB POTENTIAL: Good  CLINICAL DECISION MAKING:  Stable/uncomplicated  EVALUATION COMPLEXITY: Low  PLAN:  PT FREQUENCY: 1-2x/week  PT DURATION: 10 weeks  PLANNED INTERVENTIONS: 97110-Therapeutic exercises, 97530- Therapeutic activity, [id number]- Neuromuscular re-education, 97535- Self Care, 02859- Manual therapy, [phone]- Gait training, [phone]- Aquatic Therapy, Patient/Family education, Balance training, and Stair training  PLAN FOR NEXT SESSION: start with any functional tasks and working on gait and balance. Endurance training   [employer], PT 03/18/2024, 11:43 AM

## 2024-03-18 ENCOUNTER — Ambulatory Visit: Attending: Family Medicine

## 2024-03-18 DIAGNOSIS — R262 Difficulty in walking, not elsewhere classified: Secondary | ICD-10-CM | POA: Diagnosis present

## 2024-03-18 DIAGNOSIS — M6281 Muscle weakness (generalized): Secondary | ICD-10-CM | POA: Diagnosis present

## 2024-03-18 DIAGNOSIS — R2689 Other abnormalities of gait and mobility: Secondary | ICD-10-CM | POA: Diagnosis present

## 2024-03-18 DIAGNOSIS — R278 Other lack of coordination: Secondary | ICD-10-CM | POA: Insufficient documentation

## 2024-03-18 DIAGNOSIS — G6181 Chronic inflammatory demyelinating polyneuritis: Secondary | ICD-10-CM | POA: Diagnosis present

## 2024-03-18 DIAGNOSIS — R2681 Unsteadiness on feet: Secondary | ICD-10-CM | POA: Insufficient documentation

## 2024-03-18 DIAGNOSIS — G610 Guillain-Barre syndrome: Secondary | ICD-10-CM | POA: Diagnosis present

## 2024-03-23 NOTE — Telephone Encounter (Addendum)
 [facility] ([place]) calling for clarification on refills for prescription that was faxed over. Would like a call back. Contact information:  [phone]

## 2024-03-23 NOTE — Telephone Encounter (Signed)
 Infusion order faxed to [facility] at [phone]

## 2024-03-23 NOTE — Telephone Encounter (Signed)
 Called and spoke to [facility] and clarified the circles were just the providers notes to self and they voiced gratitude and understanding

## 2024-03-30 ENCOUNTER — Ambulatory Visit: Admitting: Physician Assistant

## 2024-04-01 ENCOUNTER — Encounter: Payer: Self-pay | Admitting: Surgical

## 2024-04-01 ENCOUNTER — Ambulatory Visit (INDEPENDENT_AMBULATORY_CARE_PROVIDER_SITE_OTHER): Admitting: Surgical

## 2024-04-01 DIAGNOSIS — M1712 Unilateral primary osteoarthritis, left knee: Secondary | ICD-10-CM

## 2024-04-01 DIAGNOSIS — M170 Bilateral primary osteoarthritis of knee: Secondary | ICD-10-CM

## 2024-04-01 DIAGNOSIS — M1711 Unilateral primary osteoarthritis, right knee: Secondary | ICD-10-CM

## 2024-04-01 MED ORDER — SODIUM HYALURONATE 60 MG/3ML IX PRSY
60.0000 mg | PREFILLED_SYRINGE | INTRA_ARTICULAR | Status: AC | PRN
  Administered 2024-04-01: 60 mg via INTRA_ARTICULAR

## 2024-04-01 MED ORDER — LIDOCAINE HCL 1 % IJ SOLN
5.0000 mL | INTRAMUSCULAR | Status: AC | PRN
  Administered 2024-04-01: 5 mL

## 2024-04-01 NOTE — Progress Notes (Signed)
   Procedure Note  [name]             Date of Birth: September 24, 1965           MRN: [id number]             Visit Date: 04/01/2024  Procedures: Visit Diagnoses: No diagnosis found.  Large Joint Inj: bilateral knee on 04/01/2024 2:30 PM Indications: diagnostic evaluation, joint swelling and pain Details: 18 G 1.5 in needle, superolateral approach  Arthrogram: No  Medications (Right): 5 mL lidocaine  1 %; 60 mg Sodium Hyaluronate 60 MG/3ML Medications (Left): 5 mL lidocaine  1 %; 60 mg Sodium Hyaluronate 60 MG/3ML Outcome: tolerated well, no immediate complications Procedure, treatment alternatives, risks and benefits explained, specific risks discussed. Consent was given by the patient. Immediately prior to procedure a time out was called to verify the correct patient, procedure, equipment, support staff and site/side marked as required. Patient was prepped and draped in the usual sterile fashion.

## 2024-04-02 ENCOUNTER — Ambulatory Visit: Admitting: Physical Therapy

## 2024-04-07 ENCOUNTER — Telehealth: Payer: Self-pay | Admitting: Family Medicine

## 2024-04-07 NOTE — Telephone Encounter (Unsigned)
 Copied from CRM [id number]. Topic: Clinical - Medication Refill >> Apr 07, 2024  3:52 PM Chasity T wrote: Medication: Oxycodone  HCl 20 MG TABS   Has the patient contacted their pharmacy? Yes   This is the patient's preferred pharmacy:  [facility] [address], [place] - [address] [place] [address] Phone: [phone] Fax: [phone]    Is this the correct pharmacy for this prescription? Yes If no, delete pharmacy and type the correct one.   Has the prescription been filled recently? Yes  Is the patient out of the medication? Yes  Has the patient been seen for an appointment in the last year OR does the patient have an upcoming appointment? Yes  Can we respond through MyChart? Yes  Agent: Please be advised that Rx refills may take up to 3 business days. We ask that you follow-up with your pharmacy.

## 2024-04-08 ENCOUNTER — Ambulatory Visit

## 2024-04-08 ENCOUNTER — Telehealth: Payer: Self-pay | Admitting: Family Medicine

## 2024-04-08 ENCOUNTER — Telehealth: Payer: Self-pay | Admitting: *Deleted

## 2024-04-08 NOTE — Telephone Encounter (Signed)
 Copied from CRM [id number]. Topic: General - Other >> Apr 02, 2024  2:53 PM Franky GRADE wrote: Reason for CRM: [facility] received a referral from the office;however, the insurance on the referral was incorrect and could not be processed. Patient is with [facility] and they would like to know if the referral can be resubmitted. >> Apr 02, 2024  3:16 PM [place] E wrote: Cornelio called back stating that the patient has [place] [employer] as her primary insurance and [facility] should be listed as secondary.

## 2024-04-08 NOTE — Telephone Encounter (Signed)
 Copied from CRM [id number]. Topic: Clinical - Medication Question >> Apr 08, 2024  9:56 AM Dana [name] wrote: Reason for CRM: patient checking the status of her medication Oxycodone  HCl 20 MG TABS qand would like a call back

## 2024-04-08 NOTE — Telephone Encounter (Signed)
 Copied from CRM [id number]. Topic: Clinical - Medication Refill >> Apr 07, 2024  3:52 PM Dana [name] wrote: Medication: Oxycodone  HCl 20 MG TABS   Has the patient contacted their pharmacy? Yes   This is the patient's preferred pharmacy:  [facility] [address], [place] - [address] [place] [address] Phone: [phone] Fax: [phone]    Is this the correct pharmacy for this prescription? Yes If no, delete pharmacy and type the correct one.   Has the prescription been filled recently? Yes  Is the patient out of the medication? Yes  Has the patient been seen for an appointment in the last year OR does the patient have an upcoming appointment? Yes  Can we respond through MyChart? Yes  Agent: Please be advised that Rx refills may take up to 3 business days. We ask that you follow-up with your pharmacy.

## 2024-04-09 ENCOUNTER — Other Ambulatory Visit: Payer: Self-pay | Admitting: Adult Health

## 2024-04-09 ENCOUNTER — Ambulatory Visit: Payer: Self-pay

## 2024-04-09 MED ORDER — OXYCODONE HCL 20 MG PO TABS: 20.0000 mg | ORAL_TABLET | Freq: Four times a day (QID) | ORAL | 0 refills | Status: DC | PRN

## 2024-04-09 NOTE — Telephone Encounter (Signed)
 FYI Spoke with pt advised that [facility] sent enough refill to last her until Monday when Dr Johnny returns to the office.

## 2024-04-09 NOTE — Telephone Encounter (Signed)
 Pt Last pain management with PCP was 02/18/24 Last refill for Oxycodone  was 03/04/24 Pt PCP is unavailable. Please advise

## 2024-04-09 NOTE — Telephone Encounter (Signed)
 Copied from CRM [id number]. Topic: Clinical - Red Word Triage >> Apr 09, 2024 10:40 AM [place] J wrote: [facility] that prompted transfer to Nurse Triage: Patient has been without her Oxycodone  HCl 20 MG TABS and is in a lot of pain. She was not notified about the turn around time for medication refills and would like help with knowing how she is supposed to manage her pain over the weekend. Reason for Disposition  Reason: professional judgment or information in Reference  Answer Assessment - Initial Assessment Questions 1. REASON FOR CALL: What is your main concern right now?   I have whole body pain and I need to speak to Nurse Inocente. Pt refused triage. Pt is out of pain medication. She called on 7/23 but is unaware of turn round time. CAL notified.  Protocols used: No Guideline Available-P-AH

## 2024-04-12 NOTE — Therapy (Signed)
 OUTPATIENT PHYSICAL THERAPY NEURO TREATMENT Progress Note Reporting Period 12/16/23 to 04/13/24  See note below for Objective Data and Assessment of Progress/Goals.      Patient Name: Dana [name] MRN: [id number] DOB:[birth year], 58 y.o., female Today's Date: 04/13/2024   PCP: Dana [name] REFERRING PROVIDER: Garnette [name]  END OF SESSION:  PT End of Session - 04/13/24 1054     Visit Number 10    Date for PT Re-Evaluation 05/25/24    Authorization Type [employer]    PT Start Time 1100    PT Stop Time 1145    PT Time Calculation (min) 45 min                  Past Medical History:  Diagnosis Date   Anxiety    Back pain with radiation    Breast mass, right    Chronic pain    Chronic, continuous use of opioids    Complication of anesthesia 04/07/14   Allergic reaction to Lisinopril  immediately following surgery   DJD (degenerative joint disease)    Fibromyalgia    Groin abscess    Headache(784.0)    History of IBS    Hypercholesterolemia    IBS (irritable bowel syndrome)    Lactose intolerance    Mild hypertension    Obesity    Tobacco use disorder    Umbilical hernia    Symptomatic   Past Surgical History:  Procedure Laterality Date   ABDOMINAL HYSTERECTOMY     ANTERIOR CERVICAL DECOMP/DISCECTOMY FUSION N/A 12/20/2015   Procedure: ANTERIOR CERVICAL DECOMPRESSION FUSION CERVICAL 4-5, CERVICAL 5-6, CERVICAL 6-7 WITH INSTRUMENTATION AND ALLOGRAFT;  Surgeon: Dana Priestly, MD;  Location: [facility] OR;  Service: Orthopedics;  Laterality: N/A;  Anterior cervical decompression fusion, cervical 4-5, cervical 5-6, cervical 6-7 with instrumentation and allograft   BACK SURGERY     BILATERAL SALPINGECTOMY  09/03/2012   Procedure: BILATERAL SALPINGECTOMY;  Surgeon: Dana VEAR Guan, MD;  Location: [facility];  Service: Gynecology;  Laterality: Bilateral;   BREAST BIOPSY Right 04/07/2014   Procedure: REMOVAL RIGHT BREAST MASS WITH WIRE LOCALIZATION;  Surgeon: Dana JINNY Russell, MD;   Location: [facility] OR;  Service: General;  Laterality: Right;   BREAST EXCISIONAL BIOPSY Left    x2   BREAST LUMPECTOMY     x2   CARPAL TUNNEL RELEASE Left 05/11/2019   Procedure: LEFT CARPAL TUNNEL RELEASE, RIGHT TENNIS ELBOW MARCAINE /DEPO MEDROL  INJECTION UNDER ANESTHESIA;  Surgeon: Dana [name] BRAVO, MD;  Location: [facility] OR;  Service: Orthopedics;  Laterality: Left;   COLONOSCOPY W/ BIOPSIES  04/24/2012   per Dr. Avram, clear, repeat in 10 yrs    disectomy     ESOPHAGOGASTRODUODENOSCOPY     FINGER SURGERY     Right index-excision of mass    FOOT SURGERY Right    Bone Spurs   IR FLUORO GUIDE CV LINE RIGHT  01/29/2023   IR REMOVAL TUN CV CATH W/O FL  02/08/2023   IR US  GUIDE VASC ACCESS RIGHT  01/31/2023   LAPAROSCOPIC HYSTERECTOMY  09/03/2012   Procedure: HYSTERECTOMY TOTAL LAPAROSCOPIC;  Surgeon: Dana VEAR Guan, MD;  Location: [facility];  Service: Gynecology;  Laterality: N/A;   LUMBAR DISC SURGERY     TUBAL LIGATION     UMBILICAL HERNIA REPAIR N/A 05/01/2018   Procedure: UMBILICAL HERNIA REPAIR;  Surgeon: Dana Lynwood, MD;  Location: [facility] OR;  Service: General;  Laterality: N/A;   Patient Active Problem List   Diagnosis Date Noted   Edema 11/20/2023  Hyperkeratosis of sole 08/01/2023   CIDP (chronic inflammatory demyelinating polyneuropathy) (HCC) 01/28/2023   Gait abnormality 01/28/2023   Glaucoma 01/28/2023   Pain due to onychomycosis of toenails of both feet 01/21/2023   Wheelchair dependence 12/30/2022   Neuropathic pain 12/30/2022   Insomnia due to medical condition 12/30/2022   Acute inflammatory demyelinating polyneuropathy (HCC) 10/01/2022   UTI (urinary tract infection) 09/24/2022   Hypomagnesemia 09/24/2022   Weakness 09/23/2022   Hypokalemia 09/23/2022   B12 deficiency 09/05/2022   Folate deficiency 09/05/2022   Carpal tunnel syndrome, left upper limb 05/11/2019    Class: Chronic   Spinal stenosis of lumbar region with neurogenic claudication 08/20/2018   Congenital  deformity of finger 09/12/2017   Primary osteoarthritis of right knee 02/27/2017   Right tennis elbow 11/11/2016   Muscle cramps 01/15/2016   Bilateral leg edema 01/08/2016   Radiculopathy 12/20/2015   Hyperglycemia 12/21/2014   Mastodynia, female 11/30/2014   S/P excision of fibroadenoma of breast 11/30/2014   Angioedema of lips 04/07/2014   Fibroadenoma of right breast 03/07/2014   Pelvic pain in female 06/19/2012   Menorrhagia 06/11/2012   SUI (stress urinary incontinence, female) 06/11/2012   HTN (hypertension) 06/11/2012   IBS (irritable bowel syndrome) - diarrhea predominant 03/17/2012   MICROSCOPIC HEMATURIA 11/30/2010   BREAST PAIN, RIGHT 11/30/2010   BREAST MASS, RIGHT 01/12/2010   HYPERCHOLESTEROLEMIA 12/07/2007   CIGARETTE SMOKER 12/07/2007   Osteoarthritis 12/07/2007   Low back pain with sciatica 12/07/2007   Headache 12/07/2007   Obesity, Class III, BMI 40-49.9 (morbid obesity) 08/03/2007   Anxiety state 08/03/2007    ONSET DATE: 12/05/22  REFERRING DIAG:  G89.4 (ICD-10-CM) - Chronic pain syndrome    THERAPY DIAG:  Guillain Barr syndrome (HCC)  CIDP (chronic inflammatory demyelinating polyneuropathy) (HCC)  Other abnormalities of gait and mobility  Other lack of coordination  Difficulty in walking, not elsewhere classified  Muscle weakness (generalized)  Unsteadiness on feet  Rationale for Evaluation and Treatment: Rehabilitation  SUBJECTIVE:                                                                                                                                                                                             SUBJECTIVE STATEMENT: We can't do anything walking. The left side of my foot is hurting so bad. I think I broke my middle toe, it rammed into the fridge and I cannot bend it.    PERTINENT HISTORY: Pt Continuation for PT due to chronic inflammatory demyelinating polyneuropathy   01/28/23 ASSESSMENT AND PLAN   Dana [name] is a 58 y.o. female   Demyelinating polyradiculoneuropathy  Symptom onset monophasic since September 2024, failed to respond to IVIG in January 2024,             EMG nerve conduction study showed demyelinating features, no evidence of axonal loss,             Talk with neurohospitalist Dr. Jerrie, ED triage, will send her for hospital admission for plasma exchange, please add on following labs, immunofixative protein electrophoresis, ANA with reflex, SSA, SSB titer, iron panel             Starting outpatient IVIG prior authorization process,   REMONIA OTTE is a 58 y.o. female who [facility] ED on 09/23/2022 with bilateral lower extremity weakness with decreased mobility that has progressed to both arms. She also reported numbness and tingling of the extremities. She was transferred to [facility] for MRI. 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  Inpatient Rehab F/B HHPT.   R knee injection 10/08/22 trigger point injections for worsening back pain 2/15  RA  PAIN:  Are you having pain? No  PRECAUTIONS: None  RED FLAGS: None   WEIGHT BEARING RESTRICTIONS: No  FALLS: Has patient fallen in last 6 months? No  LIVING ENVIRONMENT: Lives with: lives with their family Lives in: House/apartment Stairs: No Has following equipment at home: Environmental consultant - 2 wheeled and Wheelchair (power)  PLOF: Independent and Independent with basic ADLs  PATIENT GOALS: I want to get out this chair and put it down in the basement   OBJECTIVE:  Note: Objective measures were completed at Evaluation unless otherwise noted.  DIAGNOSTIC FINDINGS: Right knee radiographs 11/09/2020   FINDINGS: Severe patellofemoral joint space narrowing. Severe superior and mild inferior patellar degenerative osteophytosis. Moderate superior trochlear degenerative osteophytosis. No joint effusion. Severe medial compartment joint space narrowing with moderate peripheral medial and  lateral compartment degenerative osteophytes.   COGNITION: Overall cognitive status: Within functional limits for tasks assessed   SENSATION: Light touch: still has some numbness in L thigh down into the knee, toes are also numb a little bit   EDEMA: some BLE edema   POSTURE: rounded shoulders and forward head  LOWER EXTREMITY ROM:  grossly WFL for her means, able to move better than last time she was in PT    LOWER EXTREMITY MMT:    MMT Right Eval Left Eval  Hip flexion 4 4  Hip extension    Hip abduction 4 4  Hip adduction 4 4  Hip internal rotation    Hip external rotation    Knee flexion 4 4-  Knee extension 4- 4-  Ankle dorsiflexion    Ankle plantarflexion    Ankle inversion    Ankle eversion    (Blank rows = not tested)   TRANSFERS: Assistive device utilized: Environmental consultant - 2 wheeled and Wheelchair (power)  Sit to stand: Modified independence Stand to sit: Complete Independence Chair to chair: Modified independence  GAIT: Gait pattern: step through pattern, decreased stride length, wide BOS, poor foot clearance- Right, and poor foot clearance- Left Distance walked: in clinic distances Assistive device utilized: Environmental consultant - 2 wheeled Level of assistance: Modified independence Comments: she was able to take a few steps without the walker at eval  FUNCTIONAL TESTS:  5 times sit to stand: 13.14s from elevated mat table  Timed up and go (TUG): 29s with walker  TREATMENT DATE:  04/13/24 Recheck goals  Side steps along mat 2HHA OHP 2# 2x10 Alternating punches 2# 2x10 NuStep L5 x62mins  Walking with walker 1 big lap   03/18/24 OHP yellow ball x10 Seated horizontal abd red 2x10 Seated box taps 3# 2x10, then laterally  Ball squeeze 2x10 Seated march  Transfer from chair to mat and back  03/09/24 Recheck goals and recert Walking with walker  1 lap 116ft x2 5# LAQ and marching STS 2x10 NuStep L5x31mins    02/12/24 TUG with walker  NuStep L5x38mins  Seated calf to toe raises 2x12 STS 2x10 Box taps 4  Walking 1 lap with walker ~115ft   02/05/24 Transfer from power chair <> mat  LAQ 2x10 HS curls green 2x10 Seated chest press with 2# WATE 2x10 Seated AAROM 2# WATE shoulder flexion 2x10 Ball squeeze 2x10      01/20/24 Recheck 5xSTS goal Fitter pushes 2x10 STS with chest press 2# 2x8 Walking ~56ft from table to NuStep w/o AD NuStep L5x31mins Taps to side of treadmill 20 reps alt  Walk from treadmill back to mat table without AD   01/08/24 Taking steps in parallel bars without UE use- forwards, backwards, side steps  Calf raises by steps 2x10  Box taps 6 holding on to railings  Walk across steps 2HRA but can alternate feet  NuStep L5x64mins    12/30/23 NuStep L5x45mins  Standing marching with RW Standing hip abd with RW 2x10 STS 2x10 Walking laps 115ft, 164ft    12/16/23- EVAL    PATIENT EDUCATION: Education details: POC, aquatic therapy  Person educated: Patient Education method: Explanation Education comprehension: verbalized understanding  HOME EXERCISE PROGRAM: [id number]   GOALS: Goals reviewed with patient? Yes  SHORT TERM GOALS: Target date: 01/20/24  Patient will be independent with initial HEP. Baseline:  Goal status: MET  2.  Patient will demonstrate improved functional LE strength as demonstrated by 5xSTS <12s. Baseline: 13.14s from elevated mat table  Goal status: 8.75s from elevated table MET 01/20/24   LONG TERM GOALS: Target date: 05/25/24  Patient will be independent with advanced/ongoing HEP to improve outcomes and carryover.  Baseline:  Goal status: INITIAL  2.  Patient will be able to ambulate 500' with LRAD with good safety to access community.  Baseline: can walk with RW Goal status: IN PROGRESS 03/09/24, ongoing 04/13/24  3.  Patient will demonstrate decreased fall risk by  scoring < 20 sec on TUG. Baseline: 29s Goal status: ongoing 44s w/walker, 31s w/walker 03/09/24, 34s 04/13/24  4.  Patient will be able to walk 65ft or more with no AD Baseline: took 10 steps modI  Goal status: IN PROGRESS did 23ft 01/20/24, unable to do today 03/09/24, able to do [facility] 04/13/24  5. Patient will increase standing tolerance to 30 mins or longer to complete household and kitchen chores  Baseline: before needing to sit  Goal status: ongoing 10 mins 03/09/24, about 10 mins 04/13/24   ASSESSMENT:  CLINICAL IMPRESSION: Patient still working on her goals, she seemed to do really well in the beginning of PT but after her infusions it set her back. She will needs more visits to work on progressing with her activity tolerance and independence. She was able to walk without the walker today ~70ft with 1 HHA.   EVAL Patient is a 58 y.o. 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 She is currently still getting around in her  power chair for the most part. She reports using the walker in her home, using it to get to the bathroom and in the kitchen. States her standing tolerance is about . Patient was able to take a few steps today without an assistive device. She has made great improvements from the last time she was at our clinic. She expresses interest in doing aquatic therapy. We will set her up to do 1x land based and 1x a week aquatic therapy to be able to increase her standing and walking tolerance with and without an AD to improve her functional independence.   OBJECTIVE IMPAIRMENTS: Abnormal gait, decreased activity tolerance, decreased balance, decreased coordination, decreased endurance, difficulty walking, decreased ROM, decreased strength, postural dysfunction, and obesity.   ACTIVITY LIMITATIONS: carrying, lifting, squatting, stairs, transfers, and locomotion  level  PARTICIPATION LIMITATIONS: cleaning, laundry, driving, shopping, and community activity  PERSONAL FACTORS: Fitness, Past/current experiences, Time since onset of injury/illness/exacerbation, and 1-2 comorbidities: chronic pain, GBS are also affecting patient's functional outcome.   REHAB POTENTIAL: Good  CLINICAL DECISION MAKING: Stable/uncomplicated  EVALUATION COMPLEXITY: Low  PLAN:  PT FREQUENCY: 1-2x/week  PT DURATION: 10 weeks  PLANNED INTERVENTIONS: 97110-Therapeutic exercises, 97530- Therapeutic activity, [id number]- Neuromuscular re-education, 97535- Self Care, [id number]- Manual therapy, [id number]- Gait training, [id number]- Aquatic Therapy, Patient/Family education, Balance training, and Stair training  PLAN FOR NEXT SESSION: start with any functional tasks and working on gait and balance. Endurance training   [employer], PT 04/13/2024, 11:38 AM

## 2024-04-12 NOTE — Telephone Encounter (Signed)
She needs a PMV 

## 2024-04-12 NOTE — Telephone Encounter (Signed)
 Pt has a VV scheduled for 04/13/24 for this

## 2024-04-12 NOTE — Telephone Encounter (Signed)
 She is due for a PMV. Set up a virtual PMV (you can create a slot this afternoon or tomorrow if needed)

## 2024-04-12 NOTE — Telephone Encounter (Signed)
Please resubmit the referral.

## 2024-04-12 NOTE — Telephone Encounter (Signed)
 Pt has a MyChart visit with Dr Johnny on 04/13/24

## 2024-04-13 ENCOUNTER — Encounter: Payer: Self-pay | Admitting: Family Medicine

## 2024-04-13 ENCOUNTER — Telehealth (INDEPENDENT_AMBULATORY_CARE_PROVIDER_SITE_OTHER): Admitting: Family Medicine

## 2024-04-13 ENCOUNTER — Ambulatory Visit

## 2024-04-13 ENCOUNTER — Other Ambulatory Visit: Payer: Self-pay | Admitting: Physical Medicine and Rehabilitation

## 2024-04-13 DIAGNOSIS — M48062 Spinal stenosis, lumbar region with neurogenic claudication: Secondary | ICD-10-CM

## 2024-04-13 DIAGNOSIS — R278 Other lack of coordination: Secondary | ICD-10-CM

## 2024-04-13 DIAGNOSIS — G8929 Other chronic pain: Secondary | ICD-10-CM

## 2024-04-13 DIAGNOSIS — R262 Difficulty in walking, not elsewhere classified: Secondary | ICD-10-CM

## 2024-04-13 DIAGNOSIS — M6281 Muscle weakness (generalized): Secondary | ICD-10-CM

## 2024-04-13 DIAGNOSIS — R2681 Unsteadiness on feet: Secondary | ICD-10-CM

## 2024-04-13 DIAGNOSIS — G6181 Chronic inflammatory demyelinating polyneuritis: Secondary | ICD-10-CM

## 2024-04-13 DIAGNOSIS — G610 Guillain-Barre syndrome: Secondary | ICD-10-CM

## 2024-04-13 DIAGNOSIS — R2689 Other abnormalities of gait and mobility: Secondary | ICD-10-CM

## 2024-04-13 MED ORDER — OXYCODONE HCL 20 MG PO TABS: 20.0000 mg | ORAL_TABLET | Freq: Four times a day (QID) | ORAL | 0 refills | Status: DC | PRN

## 2024-04-13 NOTE — Progress Notes (Signed)
 Subjective:    Patient ID: Dana [name], female    DOB: 1966-09-09, 58 y.o.   MRN: [id number]  HPI Virtual Visit via Video Note  I connected with the patient on 04/13/24 at  3:00 PM EDT by a video enabled telemedicine application and verified that I am speaking with the correct person using two identifiers.  Location patient: home Location provider:work or home office Persons participating in the virtual visit: patient, provider  I discussed the limitations of evaluation and management by telemedicine and the availability of in person appointments. The patient expressed understanding and agreed to proceed.   HPI: Here for pain management. She is doing well.    ROS: See pertinent positives and negatives per HPI.  Past Medical History:  Diagnosis Date   Anxiety    Back pain with radiation    Breast mass, right    Chronic pain    Chronic, continuous use of opioids    Complication of anesthesia 04/07/14   Allergic reaction to Lisinopril  immediately following surgery   DJD (degenerative joint disease)    Fibromyalgia    Groin abscess    Headache(784.0)    History of IBS    Hypercholesterolemia    IBS (irritable bowel syndrome)    Lactose intolerance    Mild hypertension    Obesity    Tobacco use disorder    Umbilical hernia    Symptomatic    Past Surgical History:  Procedure Laterality Date   ABDOMINAL HYSTERECTOMY     ANTERIOR CERVICAL DECOMP/DISCECTOMY FUSION N/A 12/20/2015   Procedure: ANTERIOR CERVICAL DECOMPRESSION FUSION CERVICAL 4-5, CERVICAL 5-6, CERVICAL 6-7 WITH INSTRUMENTATION AND ALLOGRAFT;  Surgeon: Oneil Priestly, MD;  Location: [facility] OR;  Service: Orthopedics;  Laterality: N/A;  Anterior cervical decompression fusion, cervical 4-5, cervical 5-6, cervical 6-7 with instrumentation and allograft   BACK SURGERY     BILATERAL SALPINGECTOMY  09/03/2012   Procedure: BILATERAL SALPINGECTOMY;  Surgeon: Curlee VEAR Guan, MD;  Location: [facility];  Service: Gynecology;   Laterality: Bilateral;   BREAST BIOPSY Right 04/07/2014   Procedure: REMOVAL RIGHT BREAST MASS WITH WIRE LOCALIZATION;  Surgeon: Krystal JINNY Russell, MD;  Location: [facility] OR;  Service: General;  Laterality: Right;   BREAST EXCISIONAL BIOPSY Left    x2   BREAST LUMPECTOMY     x2   CARPAL TUNNEL RELEASE Left 05/11/2019   Procedure: LEFT CARPAL TUNNEL RELEASE, RIGHT TENNIS ELBOW MARCAINE /DEPO MEDROL  INJECTION UNDER ANESTHESIA;  Surgeon: Lucilla Lynwood BRAVO, MD;  Location: [facility] OR;  Service: Orthopedics;  Laterality: Left;   COLONOSCOPY W/ BIOPSIES  04/24/2012   per Dr. Avram, clear, repeat in 10 yrs    disectomy     ESOPHAGOGASTRODUODENOSCOPY     FINGER SURGERY     Right index-excision of mass    FOOT SURGERY Right    Bone Spurs   IR FLUORO GUIDE CV LINE RIGHT  01/29/2023   IR REMOVAL TUN CV CATH W/O FL  02/08/2023   IR US  GUIDE VASC ACCESS RIGHT  01/31/2023   LAPAROSCOPIC HYSTERECTOMY  09/03/2012   Procedure: HYSTERECTOMY TOTAL LAPAROSCOPIC;  Surgeon: Curlee VEAR Guan, MD;  Location: [facility];  Service: Gynecology;  Laterality: N/A;   LUMBAR DISC SURGERY     TUBAL LIGATION     UMBILICAL HERNIA REPAIR N/A 05/01/2018   Procedure: UMBILICAL HERNIA REPAIR;  Surgeon: Kimble Lynwood, MD;  Location: [facility] OR;  Service: General;  Laterality: N/A;    Family History  Problem Relation Age of Onset  Diabetes Mother    Diabetes Father    Breast cancer Maternal Aunt 46   Prostate cancer Paternal Grandfather      Current Outpatient Medications:    ALPRAZolam  (XANAX ) 0.5 MG tablet, Take 1 tablet (0.5 mg total) by mouth 2 (two) times daily as needed for anxiety., Disp: 180 tablet, Rfl: 1   ammonium lactate  (LAC-HYDRIN ) 12 % lotion, APPLY TOPICALLY IN THE MORNING AND AT BEDTIME, Disp: 400 mL, Rfl: 3   benzonatate  (TESSALON ) 200 MG capsule, TAKE 1 CAPSULE (200 MG TOTAL) BY MOUTH EVERY 6 (SIX) HOURS AS NEEDED FOR COUGH., Disp: 60 capsule, Rfl: 3   celecoxib  (CELEBREX ) 100 MG capsule, Take 1 capsule (100 mg total) by  mouth 2 (two) times daily., Disp: 180 capsule, Rfl: 3   cephALEXin  (KEFLEX ) 500 MG capsule, Take 1 capsule (500 mg total) by mouth 4 (four) times daily., Disp: 40 capsule, Rfl: 0   Cyanocobalamin  (VITAMIN B-12 PO), Take 1 tablet by mouth daily., Disp: , Rfl:    cyclobenzaprine  (FLEXERIL ) 10 MG tablet, TAKE 1 TABLET BY MOUTH THREE TIMES A DAY, Disp: 90 tablet, Rfl: 0   dorzolamide -timolol  (COSOPT ) 2-0.5 % ophthalmic solution, Place 1 drop into both eyes 2 (two) times daily., Disp: 10 mL, Rfl: 1   DULoxetine  (CYMBALTA ) 60 MG capsule, TAKE 1 CAPSULE BY MOUTH EVERY DAY, Disp: 90 capsule, Rfl: 4   famotidine  (PEPCID ) 20 MG tablet, Take 20 mg by mouth 2 (two) times daily., Disp: , Rfl:    fluticasone  (FLONASE ) 50 MCG/ACT nasal spray, SPRAY 2 SPRAYS INTO EACH NOSTRIL EVERY DAY, Disp: 16 mL, Rfl: 11   folic acid  (FOLVITE ) 1 MG tablet, TAKE 1 TABLET BY MOUTH EVERY DAY, Disp: 90 tablet, Rfl: 3   furosemide  (LASIX ) 40 MG tablet, TAKE 1 TABLET BY MOUTH TWICE A DAY, Disp: 180 tablet, Rfl: 3   gabapentin  (NEURONTIN ) 300 MG capsule, Take 3 capsules (900 mg total) by mouth 3 (three) times daily., Disp: 270 capsule, Rfl: 5   hydrocerin (EUCERIN) CREA, Apply 1 Application topically 2 (two) times daily., Disp: , Rfl: 0   ibuprofen  (ADVIL ) 200 MG tablet, Take 400 mg by mouth 2 (two) times daily as needed for headache or moderate pain., Disp: , Rfl:    Immune Globulin , Human, (OCTAGAM IV), Inject 85 g into the vein every 21 ( twenty-one) days. 10% octagam, Disp: , Rfl:    latanoprost  (XALATAN ) 0.005 % ophthalmic solution, Place 1 drop into both eyes at bedtime., Disp: 2.5 mL, Rfl: 1   magnesium  oxide (MAG-OX) 400 MG tablet, Take 1 tablet (400 mg total) by mouth at bedtime. (Patient taking differently: Take 200 mg by mouth at bedtime.), Disp: 90 tablet, Rfl: 3   metoprolol  tartrate (LOPRESSOR ) 50 MG tablet, TAKE 1 TABLET BY MOUTH TWICE A DAY, Disp: 180 tablet, Rfl: 3   Multiple Vitamin (MULTIVITAMIN WITH MINERALS) TABS  tablet, Take 1 tablet by mouth daily., Disp: 30 tablet, Rfl: 0   Oxycodone  HCl 20 MG TABS, Take 1 tablet (20 mg total) by mouth every 6 (six) hours as needed., Disp: 20 tablet, Rfl: 0   pantoprazole  (PROTONIX ) 40 MG tablet, TAKE 1 TABLET BY MOUTH EVERY DAY, Disp: 90 tablet, Rfl: 3   potassium chloride  (KLOR-CON ) 10 MEQ tablet, TAKE 1 TABLET BY MOUTH 2 TIMES DAILY., Disp: 180 tablet, Rfl: 2   promethazine  (PHENERGAN ) 25 MG tablet, TAKE 1 TABLET (25 MG TOTAL) BY MOUTH EVERY 4 HOURS AS NEEDED FOR NAUSEA AND VOMITING, Disp: 60 tablet, Rfl: 5  senna-docusate (SENOKOT-S) 8.6-50 MG tablet, Take 1 tablet by mouth 2 (two) times daily. (Patient taking differently: Take 1 tablet by mouth 2 (two) times daily as needed for moderate constipation.), Disp: , Rfl:    traZODone  (DESYREL ) 50 MG tablet, TAKE 1 TO 2 TABLETS BY MOUTH AT BEDTIME AS NEEDED FOR SLEEP, Disp: 180 tablet, Rfl: 1  EXAM:  VITALS per patient if applicable:  GENERAL: alert, oriented, appears well and in no acute distress  HEENT: atraumatic, conjunttiva clear, no obvious abnormalities on inspection of external nose and ears  NECK: normal movements of the head and neck  LUNGS: on inspection no signs of respiratory distress, breathing rate appears normal, no obvious gross SOB, gasping or wheezing  CV: no obvious cyanosis  MS: moves all visible extremities without noticeable abnormality  PSYCH/NEURO: pleasant and cooperative, no obvious depression or anxiety, speech and thought processing grossly intact  ASSESSMENT AND PLAN: Pain management. Indication for chronic opioid: low back pain  Medication and dose: Oxycodone  20 mg # pills per month: 120 Last UDS date: 12-26-23 Opioid Treatment Agreement signed (Y/N): 12-29-17 Opioid Treatment Agreement last reviewed with patient:  04-13-24 [facility] reviewed this encounter (include red flags): Yes Meds were refilled.  Garnette Olmsted, MD  Discussed the following assessment and plan:  No  diagnosis found.     I discussed the assessment and treatment plan with the patient. The patient was provided an opportunity to ask questions and all were answered. The patient agreed with the plan and demonstrated an understanding of the instructions.   The patient was advised to call back or seek an in-person evaluation if the symptoms worsen or if the condition fails to improve as anticipated.      Review of Systems     Objective:   Physical Exam        Assessment & Plan:

## 2024-04-14 NOTE — Therapy (Incomplete)
 OUTPATIENT PHYSICAL THERAPY NEURO TREATMENT  Patient Name: Dana [name] MRN: [id number] DOB:April 29, 1966, 58 y.o., female Today's Date: 04/14/2024   PCP: Garnette Olmsted REFERRING PROVIDER: Garnette Olmsted  END OF SESSION:            Past Medical History:  Diagnosis Date   Anxiety    Back pain with radiation    Breast mass, right    Chronic pain    Chronic, continuous use of opioids    Complication of anesthesia 04/07/14   Allergic reaction to Lisinopril  immediately following surgery   DJD (degenerative joint disease)    Fibromyalgia    Groin abscess    Headache(784.0)    History of IBS    Hypercholesterolemia    IBS (irritable bowel syndrome)    Lactose intolerance    Mild hypertension    Obesity    Tobacco use disorder    Umbilical hernia    Symptomatic   Past Surgical History:  Procedure Laterality Date   ABDOMINAL HYSTERECTOMY     ANTERIOR CERVICAL DECOMP/DISCECTOMY FUSION N/A 12/20/2015   Procedure: ANTERIOR CERVICAL DECOMPRESSION FUSION CERVICAL 4-5, CERVICAL 5-6, CERVICAL 6-7 WITH INSTRUMENTATION AND ALLOGRAFT;  Surgeon: Oneil Priestly, MD;  Location: [facility] OR;  Service: Orthopedics;  Laterality: N/A;  Anterior cervical decompression fusion, cervical 4-5, cervical 5-6, cervical 6-7 with instrumentation and allograft   BACK SURGERY     BILATERAL SALPINGECTOMY  09/03/2012   Procedure: BILATERAL SALPINGECTOMY;  Surgeon: Curlee VEAR Guan, MD;  Location: [facility];  Service: Gynecology;  Laterality: Bilateral;   BREAST BIOPSY Right 04/07/2014   Procedure: REMOVAL RIGHT BREAST MASS WITH WIRE LOCALIZATION;  Surgeon: Krystal JINNY Russell, MD;  Location: [facility] OR;  Service: General;  Laterality: Right;   BREAST EXCISIONAL BIOPSY Left    x2   BREAST LUMPECTOMY     x2   CARPAL TUNNEL RELEASE Left 05/11/2019   Procedure: LEFT CARPAL TUNNEL RELEASE, RIGHT TENNIS ELBOW MARCAINE /DEPO MEDROL  INJECTION UNDER ANESTHESIA;  Surgeon: Lucilla Lynwood BRAVO, MD;  Location: [facility] OR;  Service: Orthopedics;   Laterality: Left;   COLONOSCOPY W/ BIOPSIES  04/24/2012   per Dr. Avram, clear, repeat in 10 yrs    disectomy     ESOPHAGOGASTRODUODENOSCOPY     FINGER SURGERY     Right index-excision of mass    FOOT SURGERY Right    Bone Spurs   IR FLUORO GUIDE CV LINE RIGHT  01/29/2023   IR REMOVAL TUN CV CATH W/O FL  02/08/2023   IR US  GUIDE VASC ACCESS RIGHT  01/31/2023   LAPAROSCOPIC HYSTERECTOMY  09/03/2012   Procedure: HYSTERECTOMY TOTAL LAPAROSCOPIC;  Surgeon: Curlee VEAR Guan, MD;  Location: [facility];  Service: Gynecology;  Laterality: N/A;   LUMBAR DISC SURGERY     TUBAL LIGATION     UMBILICAL HERNIA REPAIR N/A 05/01/2018   Procedure: UMBILICAL HERNIA REPAIR;  Surgeon: Kimble Lynwood, MD;  Location: [facility] OR;  Service: General;  Laterality: N/A;   Patient Active Problem List   Diagnosis Date Noted   Edema 11/20/2023   Hyperkeratosis of sole 08/01/2023   CIDP (chronic inflammatory demyelinating polyneuropathy) (HCC) 01/28/2023   Gait abnormality 01/28/2023   Glaucoma 01/28/2023   Pain due to onychomycosis of toenails of both feet 01/21/2023   Wheelchair dependence 12/30/2022   Neuropathic pain 12/30/2022   Insomnia due to medical condition 12/30/2022   Acute inflammatory demyelinating polyneuropathy (HCC) 10/01/2022   UTI (urinary tract infection) 09/24/2022   Hypomagnesemia 09/24/2022   Weakness 09/23/2022   Hypokalemia 09/23/2022  B12 deficiency 09/05/2022   Folate deficiency 09/05/2022   Carpal tunnel syndrome, left upper limb 05/11/2019    Class: Chronic   Spinal stenosis of lumbar region with neurogenic claudication 08/20/2018   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   Primary osteoarthritis of right knee 02/27/2017   Right tennis elbow 11/11/2016   Muscle cramps 01/15/2016   Bilateral leg edema 01/08/2016   Radiculopathy 12/20/2015   Hyperglycemia 12/21/2014   Mastodynia, female 11/30/2014   S/P excision of fibroadenoma of breast 11/30/2014   Angioedema of lips 04/07/2014    Fibroadenoma of right breast 03/07/2014   Pelvic pain in female 06/19/2012   Menorrhagia 06/11/2012   [name] (stress urinary incontinence, female) 06/11/2012   HTN (hypertension) 06/11/2012   IBS (irritable bowel syndrome) - diarrhea predominant 03/17/2012   MICROSCOPIC HEMATURIA 11/30/2010   BREAST PAIN, RIGHT 11/30/2010   BREAST MASS, RIGHT 01/12/2010   HYPERCHOLESTEROLEMIA 12/07/2007   CIGARETTE SMOKER 12/07/2007   Osteoarthritis 12/07/2007   Low back pain with sciatica 12/07/2007   Headache 12/07/2007   Obesity, Class III, BMI 40-49.9 (morbid obesity) 08/03/2007   Anxiety state 08/03/2007    ONSET DATE: 12/05/22  REFERRING DIAG:  G89.4 (ICD-10-CM) - Chronic pain syndrome    THERAPY DIAG:  No diagnosis found.  Rationale for Evaluation and Treatment: Rehabilitation  SUBJECTIVE:                                                                                                                                                                                             SUBJECTIVE STATEMENT:   PERTINENT HISTORY: Pt Continuation for PT due to chronic inflammatory demyelinating polyneuropathy   01/28/23 ASSESSMENT AND PLAN   Dana [name] is a 58 y.o. female   Demyelinating polyradiculoneuropathy             Symptom onset monophasic since September 2024, failed to respond to IVIG in January 2024,             EMG nerve conduction study showed demyelinating features, no evidence of axonal loss,             Talk with neurohospitalist Dr. Jerrie, ED triage, will send her for hospital admission for plasma exchange, please add on following labs, immunofixative protein electrophoresis, ANA with reflex, SSA, SSB titer, iron panel             Starting outpatient IVIG prior authorization process,   Dana [name] is a 58 y.o. female who presented to the [facility] ED on 09/23/2022 with bilateral lower extremity weakness with decreased mobility that has progressed to both arms.  She also reported  numbness and tingling of the extremities. She was transferred to [facility] for MRI. 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  Inpatient Rehab F/B HHPT.   R knee injection 10/08/22 trigger point injections for worsening back pain 2/15  RA  PAIN:  Are you having pain? No  PRECAUTIONS: None  RED FLAGS: None   WEIGHT BEARING RESTRICTIONS: No  FALLS: Has patient fallen in last 6 months? No  LIVING ENVIRONMENT: Lives with: lives with their family Lives in: House/apartment Stairs: No Has following equipment at home: Environmental consultant - 2 wheeled and Wheelchair (power)  PLOF: Independent and Independent with basic ADLs  PATIENT GOALS: I want to get out this chair and put it down in the basement   OBJECTIVE:  Note: Objective measures were completed at Evaluation unless otherwise noted.  DIAGNOSTIC FINDINGS: Right knee radiographs 11/09/2020   FINDINGS: Severe patellofemoral joint space narrowing. Severe superior and mild inferior patellar degenerative osteophytosis. Moderate superior trochlear degenerative osteophytosis. No joint effusion. Severe medial compartment joint space narrowing with moderate peripheral medial and lateral compartment degenerative osteophytes.   COGNITION: Overall cognitive status: Within functional limits for tasks assessed   SENSATION: Light touch: still has some numbness in L thigh down into the knee, toes are also numb a little bit   EDEMA: some BLE edema   POSTURE: rounded shoulders and forward head  LOWER EXTREMITY ROM:  grossly WFL for her means, able to move better than last time she was in PT    LOWER EXTREMITY MMT:    MMT Right Eval Left Eval  Hip flexion 4 4  Hip extension    Hip abduction 4 4  Hip adduction 4 4  Hip internal rotation    Hip external rotation    Knee flexion 4 4-  Knee extension 4- 4-  Ankle dorsiflexion    Ankle plantarflexion    Ankle inversion    Ankle eversion    (Blank rows = not  tested)   TRANSFERS: Assistive device utilized: Environmental consultant - 2 wheeled and Wheelchair (power)  Sit to stand: Modified independence Stand to sit: Complete Independence Chair to chair: Modified independence  GAIT: Gait pattern: step through pattern, decreased stride length, wide BOS, poor foot clearance- Right, and poor foot clearance- Left Distance walked: in clinic distances Assistive device utilized: Environmental consultant - 2 wheeled Level of assistance: Modified independence Comments: she was able to take a few steps without the walker at eval  FUNCTIONAL TESTS:  5 times sit to stand: 13.14s from elevated mat table  Timed up and go (TUG): 29s with walker                                                                                                                              TREATMENT DATE:  04/15/24 NuStep Walking with 1 HHA  Shoulder ext and rows green 2x10 Box taps 6  LAQ 5# Green band HS curls   04/13/24 Recheck goals  Side steps along  mat 2HHA OHP 2# 2x10 Alternating punches 2# 2x10 NuStep L5 x58mins  Walking with walker 1 big lap   03/18/24 OHP yellow ball x10 Seated horizontal abd red 2x10 Seated box taps 3# 2x10, then laterally  Ball squeeze 2x10 Seated march  Transfer from chair to mat and back  03/09/24 Recheck goals and recert Walking with walker 1 lap 13ft x2 5# LAQ and marching STS 2x10 NuStep L5x52mins    02/12/24 TUG with walker  NuStep L5x30mins  Seated calf to toe raises 2x12 STS 2x10 Box taps 4  Walking 1 lap with walker ~161ft   02/05/24 Transfer from power chair <> mat  LAQ 2x10 HS curls green 2x10 Seated chest press with 2# WATE 2x10 Seated AAROM 2# WATE shoulder flexion 2x10 Ball squeeze 2x10      01/20/24 Recheck 5xSTS goal Fitter pushes 2x10 STS with chest press 2# 2x8 Walking ~70ft from table to NuStep w/o AD NuStep L5x59mins Taps to side of treadmill 20 reps alt  Walk from treadmill back to mat table without AD   01/08/24 Taking  steps in parallel bars without UE use- forwards, backwards, side steps  Calf raises by steps 2x10  Box taps 6 holding on to railings  Walk across steps 2HRA but can alternate feet  NuStep L5x78mins    12/30/23 NuStep L5x8mins  Standing marching with RW Standing hip abd with RW 2x10 STS 2x10 Walking laps 120ft, 121ft    12/16/23- EVAL    PATIENT EDUCATION: Education details: POC, aquatic therapy  Person educated: Patient Education method: Explanation Education comprehension: verbalized understanding  HOME EXERCISE PROGRAM: [id number]   GOALS: Goals reviewed with patient? Yes  SHORT TERM GOALS: Target date: 01/20/24  Patient will be independent with initial HEP. Baseline:  Goal status: MET  2.  Patient will demonstrate improved functional LE strength as demonstrated by 5xSTS <12s. Baseline: 13.14s from elevated mat table  Goal status: 8.75s from elevated table MET 01/20/24   LONG TERM GOALS: Target date: 05/25/24  Patient will be independent with advanced/ongoing HEP to improve outcomes and carryover.  Baseline:  Goal status: INITIAL  2.  Patient will be able to ambulate 500' with LRAD with good safety to access community.  Baseline: can walk with RW Goal status: IN PROGRESS 03/09/24, ongoing 04/13/24  3.  Patient will demonstrate decreased fall risk by scoring < 20 sec on TUG. Baseline: 29s Goal status: ongoing 44s w/walker, 31s w/walker 03/09/24, 34s 04/13/24  4.  Patient will be able to walk 76ft or more with no AD Baseline: took 10 steps modI  Goal status: IN PROGRESS did 72ft 01/20/24, unable to do today 03/09/24, able to do [facility] 04/13/24  5. Patient will increase standing tolerance to 30 mins or longer to complete household and kitchen chores  Baseline: before needing to sit  Goal status: ongoing 10 mins 03/09/24, about 10 mins 04/13/24   ASSESSMENT:  CLINICAL IMPRESSION: Patient still working on her goals, she seemed to do really well in the beginning of  PT but after her infusions it set her back. She will needs more visits to work on progressing with her activity tolerance and independence. She was able to walk without the walker today ~72ft with 1 HHA.   EVAL Patient is a 58 y.o. 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 She is currently still getting around in her power chair for the most part. She reports using the  walker in her home, using it to get to the bathroom and in the kitchen. States her standing tolerance is about . Patient was able to take a few steps today without an assistive device. She has made great improvements from the last time she was at our clinic. She expresses interest in doing aquatic therapy. We will set her up to do 1x land based and 1x a week aquatic therapy to be able to increase her standing and walking tolerance with and without an AD to improve her functional independence.   OBJECTIVE IMPAIRMENTS: Abnormal gait, decreased activity tolerance, decreased balance, decreased coordination, decreased endurance, difficulty walking, decreased ROM, decreased strength, postural dysfunction, and obesity.   ACTIVITY LIMITATIONS: carrying, lifting, squatting, stairs, transfers, and locomotion level  PARTICIPATION LIMITATIONS: cleaning, laundry, driving, shopping, and community activity  PERSONAL FACTORS: Fitness, Past/current experiences, Time since onset of injury/illness/exacerbation, and 1-2 comorbidities: chronic pain, GBS are also affecting patient's functional outcome.   REHAB POTENTIAL: Good  CLINICAL DECISION MAKING: Stable/uncomplicated  EVALUATION COMPLEXITY: Low  PLAN:  PT FREQUENCY: 1-2x/week  PT DURATION: 10 weeks  PLANNED INTERVENTIONS: 97110-Therapeutic exercises, 97530- Therapeutic activity, [id number]- Neuromuscular re-education, 97535- Self Care, [id number]- Manual therapy, [phone]- Gait training,  [phone]- Aquatic Therapy, Patient/Family education, Balance training, and Stair training  PLAN FOR NEXT SESSION: start with any functional tasks and working on gait and balance. Endurance training   [employer], PT 04/14/2024, 9:39 AM

## 2024-04-15 ENCOUNTER — Ambulatory Visit

## 2024-04-19 NOTE — Therapy (Incomplete)
 OUTPATIENT PHYSICAL THERAPY NEURO TREATMENT  Patient Name: Dana [name] MRN: [id number] DOB:Jun 30, 1966, 58 y.o., female Today's Date: 04/19/2024   PCP: Garnette Olmsted REFERRING PROVIDER: Garnette Olmsted  END OF SESSION:            Past Medical History:  Diagnosis Date   Anxiety    Back pain with radiation    Breast mass, right    Chronic pain    Chronic, continuous use of opioids    Complication of anesthesia 04/07/14   Allergic reaction to Lisinopril  immediately following surgery   DJD (degenerative joint disease)    Fibromyalgia    Groin abscess    Headache(784.0)    History of IBS    Hypercholesterolemia    IBS (irritable bowel syndrome)    Lactose intolerance    Mild hypertension    Obesity    Tobacco use disorder    Umbilical hernia    Symptomatic   Past Surgical History:  Procedure Laterality Date   ABDOMINAL HYSTERECTOMY     ANTERIOR CERVICAL DECOMP/DISCECTOMY FUSION N/A 12/20/2015   Procedure: ANTERIOR CERVICAL DECOMPRESSION FUSION CERVICAL 4-5, CERVICAL 5-6, CERVICAL 6-7 WITH INSTRUMENTATION AND ALLOGRAFT;  Surgeon: Oneil Priestly, MD;  Location: [facility] OR;  Service: Orthopedics;  Laterality: N/A;  Anterior cervical decompression fusion, cervical 4-5, cervical 5-6, cervical 6-7 with instrumentation and allograft   BACK SURGERY     BILATERAL SALPINGECTOMY  09/03/2012   Procedure: BILATERAL SALPINGECTOMY;  Surgeon: Curlee VEAR Guan, MD;  Location: [facility] ORS;  Service: Gynecology;  Laterality: Bilateral;   BREAST BIOPSY Right 04/07/2014   Procedure: REMOVAL RIGHT BREAST MASS WITH WIRE LOCALIZATION;  Surgeon: Krystal JINNY Russell, MD;  Location[facility] OR;  Service: General;  Laterality: Right;   BREAST EXCISIONAL BIOPSY Left    x2   BREAST LUMPECTOMY     x2   CARPAL TUNNEL RELEASE Left 05/11/2019   Procedure: LEFT CARPAL TUNNEL RELEASE, RIGHT TENNIS ELBOW MARCAINE /DEPO MEDROL  INJECTION UNDER ANESTHESIA;  Surgeon: Lucilla Lynwood BRAVO, MD;  Location: [facility] OR;  Service: Orthopedics;   Laterality: Left;   COLONOSCOPY W/ BIOPSIES  04/24/2012   per Dr. Avram, clear, repeat in 10 yrs    disectomy     ESOPHAGOGASTRODUODENOSCOPY     FINGER SURGERY     Right index-excision of mass    FOOT SURGERY Right    Bone Spurs   IR FLUORO GUIDE CV LINE RIGHT  01/29/2023   IR REMOVAL TUN CV CATH W/O FL  02/08/2023   IR US  GUIDE VASC ACCESS RIGHT  01/31/2023   LAPAROSCOPIC HYSTERECTOMY  09/03/2012   Procedure: HYSTERECTOMY TOTAL LAPAROSCOPIC;  Surgeon: Curlee VEAR Guan, MD;  Location: [facility] OR[facility];  Service: Gynecology;  Laterality: N/A;   LUMBAR DISC SURGERY     TUBAL LIGATION     UMBILICAL HERNIA REPAIR N/A 05/01/2018   Procedure: UMBILICAL HERNIA REPAIR;  Surgeon: Kimble Lynwood, MD;  Location: [facility] OR;  Service: General;  Laterality: N/A;   Patient Active Problem List   Diagnosis Date Noted   Edema 11/20/2023   Hyperkeratosis of sole 08/01/2023   CIDP (chronic inflammatory demyelinating polyneuropathy) (HCC) 01/28/2023   Gait abnormality 01/28/2023   Glaucoma 01/28/2023   Pain due to onychomycosis of toenails of both feet 01/21/2023   Wheelchair dependence 12/30/2022   Neuropathic pain 12/30/2022   Insomnia due to medical condition 12/30/2022   Acute inflammatory demyelinating polyneuropathy (HCC) 10/01/2022   UTI (urinary tract infection) 09/24/2022   Hypomagnesemia 09/24/2022   Weakness 09/23/2022   Hypokalemia 09/23/2022  B12 deficiency 09/05/2022   Folate deficiency 09/05/2022   Carpal tunnel syndrome, left upper limb 05/11/2019    Class: Chronic   Spinal stenosis of lumbar region with neurogenic claudication 08/20/2018   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   Primary osteoarthritis of right knee 02/27/2017   Right tennis elbow 11/11/2016   Muscle cramps 01/15/2016   Bilateral leg edema 01/08/2016   Radiculopathy 12/20/2015   Hyperglycemia 12/21/2014   Mastodynia, female 11/30/2014   S/P excision of fibroadenoma of breast 11/30/2014   Angioedema of lips 04/07/2014    Fibroadenoma of right breast 03/07/2014   Pelvic pain in female 06/19/2012   Menorrhagia 06/11/2012   SUI (stress urinary incontinence, female) 06/11/2012   HTN (hypertension) 06/11/2012   IBS (irritable bowel syndrome) - diarrhea predominant 03/17/2012   MICROSCOPIC HEMATURIA 11/30/2010   BREAST PAIN, RIGHT 11/30/2010   BREAST MASS, RIGHT 01/12/2010   HYPERCHOLESTEROLEMIA 12/07/2007   CIGARETTE SMOKER 12/07/2007   Osteoarthritis 12/07/2007   Low back pain with sciatica 12/07/2007   Headache 12/07/2007   Obesity, Class III, BMI 40-49.9 (morbid obesity) 08/03/2007   Anxiety state 08/03/2007    ONSET DATE: 12/05/22  REFERRING DIAG:  G89.4 (ICD-10-CM) - Chronic pain syndrome    THERAPY DIAG:  No diagnosis found.  Rationale for Evaluation and Treatment: Rehabilitation  SUBJECTIVE:                                                                                                                                                                                             SUBJECTIVE STATEMENT:   PERTINENT HISTORY: Pt Continuation for PT due to chronic inflammatory demyelinating polyneuropathy   01/28/23 ASSESSMENT AND PLAN   Dana [name] is a 58 y.o. female   Demyelinating polyradiculoneuropathy             Symptom onset monophasic since September 2024, failed to respond to IVIG in January 2024,             EMG nerve conduction study showed demyelinating features, no evidence of axonal loss,             Talk with neurohospitalist Dr. Jerrie, ED triage, will send her for hospital admission for plasma exchange, please add on following labs, immunofixative protein electrophoresis, ANA with reflex, SSA, SSB titer, iron panel             Starting outpatient IVIG prior authorization process,   Dana [name] is a 58 y.o. female who presented to the [facility] ED on 09/23/2022 with bilateral lower extremity weakness with decreased mobility that has progressed to both arms.  She also reported  numbness and tingling of the extremities. She was transferred to [facility] for MRI. 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  Inpatient Rehab F/B HHPT.   R knee injection 10/08/22 trigger point injections for worsening back pain 2/15  RA  PAIN:  Are you having pain? No  PRECAUTIONS: None  RED FLAGS: None   WEIGHT BEARING RESTRICTIONS: No  FALLS: Has patient fallen in last 6 months? No  LIVING ENVIRONMENT: Lives with: lives with their family Lives in: House/apartment Stairs: No Has following equipment at home: Environmental consultant - 2 wheeled and Wheelchair (power)  PLOF: Independent and Independent with basic ADLs  PATIENT GOALS: I want to get out this chair and put it down in the basement   OBJECTIVE:  Note: Objective measures were completed at Evaluation unless otherwise noted.  DIAGNOSTIC FINDINGS: Right knee radiographs 11/09/2020   FINDINGS: Severe patellofemoral joint space narrowing. Severe superior and mild inferior patellar degenerative osteophytosis. Moderate superior trochlear degenerative osteophytosis. No joint effusion. Severe medial compartment joint space narrowing with moderate peripheral medial and lateral compartment degenerative osteophytes.   COGNITION: Overall cognitive status: Within functional limits for tasks assessed   SENSATION: Light touch: still has some numbness in L thigh down into the knee, toes are also numb a little bit   EDEMA: some BLE edema   POSTURE: rounded shoulders and forward head  LOWER EXTREMITY ROM:  grossly WFL for her means, able to move better than last time she was in PT    LOWER EXTREMITY MMT:    MMT Right Eval Left Eval  Hip flexion 4 4  Hip extension    Hip abduction 4 4  Hip adduction 4 4  Hip internal rotation    Hip external rotation    Knee flexion 4 4-  Knee extension 4- 4-  Ankle dorsiflexion    Ankle plantarflexion    Ankle inversion    Ankle eversion    (Blank rows = not  tested)   TRANSFERS: Assistive device utilized: Environmental consultant - 2 wheeled and Wheelchair (power)  Sit to stand: Modified independence Stand to sit: Complete Independence Chair to chair: Modified independence  GAIT: Gait pattern: step through pattern, decreased stride length, wide BOS, poor foot clearance- Right, and poor foot clearance- Left Distance walked: in clinic distances Assistive device utilized: Environmental consultant - 2 wheeled Level of assistance: Modified independence Comments: she was able to take a few steps without the walker at eval  FUNCTIONAL TESTS:  5 times sit to stand: 13.14s from elevated mat table  Timed up and go (TUG): 29s with walker                                                                                                                              TREATMENT DATE:  04/20/24 NuStep Walking with 1 HHA  Shoulder ext and rows green 2x10 Box taps 6  LAQ 5# Green band HS curls   04/13/24 Recheck goals  Side steps along  mat 2HHA OHP 2# 2x10 Alternating punches 2# 2x10 NuStep L5 x28mins  Walking with walker 1 big lap   03/18/24 OHP yellow ball x10 Seated horizontal abd red 2x10 Seated box taps 3# 2x10, then laterally  Ball squeeze 2x10 Seated march  Transfer from chair to mat and back  03/09/24 Recheck goals and recert Walking with walker 1 lap 16ft x2 5# LAQ and marching STS 2x10 NuStep L5x85mins    02/12/24 TUG with walker  NuStep L5x32mins  Seated calf to toe raises 2x12 STS 2x10 Box taps 4  Walking 1 lap with walker ~183ft   02/05/24 Transfer from power chair <> mat  LAQ 2x10 HS curls green 2x10 Seated chest press with 2# WATE 2x10 Seated AAROM 2# WATE shoulder flexion 2x10 Ball squeeze 2x10      01/20/24 Recheck 5xSTS goal Fitter pushes 2x10 STS with chest press 2# 2x8 Walking ~17ft from table to NuStep w/o AD NuStep L5x29mins Taps to side of treadmill 20 reps alt  Walk from treadmill back to mat table without AD   01/08/24 Taking steps  in parallel bars without UE use- forwards, backwards, side steps  Calf raises by steps 2x10  Box taps 6 holding on to railings  Walk across steps 2HRA but can alternate feet  NuStep L5x38mins    12/30/23 NuStep L5x29mins  Standing marching with RW Standing hip abd with RW 2x10 STS 2x10 Walking laps 117ft, 11ft    12/16/23- EVAL    PATIENT EDUCATION: Education details: POC, aquatic therapy  Person educated: Patient Education method: Explanation Education comprehension: verbalized understanding  HOME EXERCISE PROGRAM: [id number]   GOALS: Goals reviewed with patient? Yes  SHORT TERM GOALS: Target date: 01/20/24  Patient will be independent with initial HEP. Baseline:  Goal status: MET  2.  Patient will demonstrate improved functional LE strength as demonstrated by 5xSTS <12s. Baseline: 13.14s from elevated mat table  Goal status: 8.75s from elevated table MET 01/20/24   LONG TERM GOALS: Target date: 05/25/24  Patient will be independent with advanced/ongoing HEP to improve outcomes and carryover.  Baseline:  Goal status: INITIAL  2.  Patient will be able to ambulate 500' with LRAD with good safety to access community.  Baseline: can walk with RW Goal status: IN PROGRESS 03/09/24, ongoing 04/13/24  3.  Patient will demonstrate decreased fall risk by scoring < 20 sec on TUG. Baseline: 29s Goal status: ongoing 44s w/walker, 31s w/walker 03/09/24, 34s 04/13/24  4.  Patient will be able to walk 41ft or more with no AD Baseline: took 10 steps modI  Goal status: IN PROGRESS did 48ft 01/20/24, unable to do today 03/09/24, able to do [facility] 04/13/24  5. Patient will increase standing tolerance to 30 mins or longer to complete household and kitchen chores  Baseline: before needing to sit  Goal status: ongoing 10 mins 03/09/24, about 10 mins 04/13/24   ASSESSMENT:  CLINICAL IMPRESSION: Patient still working on her goals, she seemed to do really well in the beginning of PT but  after her infusions it set her back. She will needs more visits to work on progressing with her activity tolerance and independence. She was able to walk without the walker today ~82ft with 1 HHA.   EVAL Patient is a 58 y.o. 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 She is currently still getting around in her power chair for the most part. She reports using the  walker in her home, using it to get to the bathroom and in the kitchen. States her standing tolerance is about . Patient was able to take a few steps today without an assistive device. She has made great improvements from the last time she was at our clinic. She expresses interest in doing aquatic therapy. We will set her up to do 1x land based and 1x a week aquatic therapy to be able to increase her standing and walking tolerance with and without an AD to improve her functional independence.   OBJECTIVE IMPAIRMENTS: Abnormal gait, decreased activity tolerance, decreased balance, decreased coordination, decreased endurance, difficulty walking, decreased ROM, decreased strength, postural dysfunction, and obesity.   ACTIVITY LIMITATIONS: carrying, lifting, squatting, stairs, transfers, and locomotion level  PARTICIPATION LIMITATIONS: cleaning, laundry, driving, shopping, and community activity  PERSONAL FACTORS: Fitness, Past/current experiences, Time since onset of injury/illness/exacerbation, and 1-2 comorbidities: chronic pain, GBS are also affecting patient's functional outcome.   REHAB POTENTIAL: Good  CLINICAL DECISION MAKING: Stable/uncomplicated  EVALUATION COMPLEXITY: Low  PLAN:  PT FREQUENCY: 1-2x/week  PT DURATION: 10 weeks  PLANNED INTERVENTIONS: 97110-Therapeutic exercises, 97530- Therapeutic activity, [id number]- Neuromuscular re-education, 97535- Self Care, [id number]- Manual therapy, [phone]- Gait training, [phone]-  Aquatic Therapy, Patient/Family education, Balance training, and Stair training  PLAN FOR NEXT SESSION: start with any functional tasks and working on gait and balance. Endurance training   [employer], PT 04/19/2024, 2:17 PM

## 2024-04-20 ENCOUNTER — Ambulatory Visit

## 2024-04-21 NOTE — Therapy (Signed)
 OUTPATIENT PHYSICAL THERAPY NEURO TREATMENT  Patient Name: Dana [name] MRN: [id number] DOB:[birth year], 58 y.o., female Today's Date: 04/22/2024   PCP: Garnette Olmsted REFERRING PROVIDER: Garnette Olmsted  END OF SESSION:  PT End of Session - 04/22/24 1059     Visit Number 11    Date for PT Re-Evaluation 05/25/24    Authorization Type [employer]    PT Start Time 1100    PT Stop Time 1145    PT Time Calculation (min) 45 min                   Past Medical History:  Diagnosis Date   Anxiety    Back pain with radiation    Breast mass, right    Chronic pain    Chronic, continuous use of opioids    Complication of anesthesia 04/07/14   Allergic reaction to Lisinopril  immediately following surgery   DJD (degenerative joint disease)    Fibromyalgia    Groin abscess    Headache(784.0)    History of IBS    Hypercholesterolemia    IBS (irritable bowel syndrome)    Lactose intolerance    Mild hypertension    Obesity    Tobacco use disorder    Umbilical hernia    Symptomatic   Past Surgical History:  Procedure Laterality Date   ABDOMINAL HYSTERECTOMY     ANTERIOR CERVICAL DECOMP/DISCECTOMY FUSION N/A 12/20/2015   Procedure: ANTERIOR CERVICAL DECOMPRESSION FUSION CERVICAL 4-5, CERVICAL 5-6, CERVICAL 6-7 WITH INSTRUMENTATION AND ALLOGRAFT;  Surgeon: Oneil Priestly, MD;  Location: [facility] OR;  Service: Orthopedics;  Laterality: N/A;  Anterior cervical decompression fusion, cervical 4-5, cervical 5-6, cervical 6-7 with instrumentation and allograft   BACK SURGERY     BILATERAL SALPINGECTOMY  09/03/2012   Procedure: BILATERAL SALPINGECTOMY;  Surgeon: Curlee VEAR Guan, MD;  Location: [facility];  Service: Gynecology;  Laterality: Bilateral;   BREAST BIOPSY Right 04/07/2014   Procedure: REMOVAL RIGHT BREAST MASS WITH WIRE LOCALIZATION;  Surgeon: Krystal JINNY Russell, MD;  Location: [facility] OR;  Service: General;  Laterality: Right;   BREAST EXCISIONAL BIOPSY Left    x2   BREAST LUMPECTOMY     x2    CARPAL TUNNEL RELEASE Left 05/11/2019   Procedure: LEFT CARPAL TUNNEL RELEASE, RIGHT TENNIS ELBOW MARCAINE /DEPO MEDROL  INJECTION UNDER ANESTHESIA;  Surgeon: Lucilla Lynwood BRAVO, MD;  Location: [facility] OR;  Service: Orthopedics;  Laterality: Left;   COLONOSCOPY W/ BIOPSIES  04/24/2012   per Dr. Avram, clear, repeat in 10 yrs    disectomy     ESOPHAGOGASTRODUODENOSCOPY     FINGER SURGERY     Right index-excision of mass    FOOT SURGERY Right    Bone Spurs   IR FLUORO GUIDE CV LINE RIGHT  01/29/2023   IR REMOVAL TUN CV CATH W/O FL  02/08/2023   IR US  GUIDE VASC ACCESS RIGHT  01/31/2023   LAPAROSCOPIC HYSTERECTOMY  09/03/2012   Procedure: HYSTERECTOMY TOTAL LAPAROSCOPIC;  Surgeon: Curlee VEAR Guan, MD;  Location: [facility];  Service: Gynecology;  Laterality: N/A;   LUMBAR DISC SURGERY     TUBAL LIGATION     UMBILICAL HERNIA REPAIR N/A 05/01/2018   Procedure: UMBILICAL HERNIA REPAIR;  Surgeon: Kimble Lynwood, MD;  Location: [facility] OR;  Service: General;  Laterality: N/A;   Patient Active Problem List   Diagnosis Date Noted   Edema 11/20/2023   Hyperkeratosis of sole 08/01/2023   CIDP (chronic inflammatory demyelinating polyneuropathy) (HCC) 01/28/2023   Gait abnormality 01/28/2023  Glaucoma 01/28/2023   Pain due to onychomycosis of toenails of both feet 01/21/2023   Wheelchair dependence 12/30/2022   Neuropathic pain 12/30/2022   Insomnia due to medical condition 12/30/2022   Acute inflammatory demyelinating polyneuropathy (HCC) 10/01/2022   UTI (urinary tract infection) 09/24/2022   Hypomagnesemia 09/24/2022   Weakness 09/23/2022   Hypokalemia 09/23/2022   B12 deficiency 09/05/2022   Folate deficiency 09/05/2022   Carpal tunnel syndrome, left upper limb 05/11/2019    Class: Chronic   Spinal stenosis of lumbar region with neurogenic claudication 08/20/2018   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   Primary osteoarthritis of right knee 02/27/2017   Right tennis elbow 11/11/2016   Muscle cramps  01/15/2016   Bilateral leg edema 01/08/2016   Radiculopathy 12/20/2015   Hyperglycemia 12/21/2014   Mastodynia, female 11/30/2014   S/P excision of fibroadenoma of breast 11/30/2014   Angioedema of lips 04/07/2014   Fibroadenoma of right breast 03/07/2014   Pelvic pain in female 06/19/2012   Menorrhagia 06/11/2012   SUI (stress urinary incontinence, female) 06/11/2012   HTN (hypertension) 06/11/2012   IBS (irritable bowel syndrome) - diarrhea predominant 03/17/2012   MICROSCOPIC HEMATURIA 11/30/2010   BREAST PAIN, RIGHT 11/30/2010   BREAST MASS, RIGHT 01/12/2010   HYPERCHOLESTEROLEMIA 12/07/2007   CIGARETTE SMOKER 12/07/2007   Osteoarthritis 12/07/2007   Low back pain with sciatica 12/07/2007   Headache 12/07/2007   Obesity, Class III, BMI 40-49.9 (morbid obesity) 08/03/2007   Anxiety state 08/03/2007    ONSET DATE: 12/05/22  REFERRING DIAG:  G89.4 (ICD-10-CM) - Chronic pain syndrome    THERAPY DIAG:  Guillain Barr syndrome (HCC)  CIDP (chronic inflammatory demyelinating polyneuropathy) (HCC)  Difficulty in walking, not elsewhere classified  Muscle weakness (generalized)  Other abnormalities of gait and mobility  Other lack of coordination  Unsteadiness on feet  Rationale for Evaluation and Treatment: Rehabilitation  SUBJECTIVE:                                                                                                                                                                                             SUBJECTIVE STATEMENT: I had the infusions last Thursday. The rain is not helping my arthritis.    PERTINENT HISTORY: Pt Continuation for PT due to chronic inflammatory demyelinating polyneuropathy   01/28/23 ASSESSMENT AND PLAN   Dana [name] is a 58 y.o. female   Demyelinating polyradiculoneuropathy             Symptom onset monophasic since September 2024, failed to respond to IVIG in January 2024,             EMG nerve  conduction study showed  demyelinating features, no evidence of axonal loss,             Talk with neurohospitalist Dr. Jerrie, ED triage, will send her for hospital admission for plasma exchange, please add on following labs, immunofixative protein electrophoresis, ANA with reflex, SSA, SSB titer, iron panel             Starting outpatient IVIG prior authorization process,   Dana [name] is a 58 y.o. female who presented to the [facility] ED on 09/23/2022 with bilateral lower extremity weakness with decreased mobility that has progressed to both arms. She also reported numbness and tingling of the extremities. She was transferred to [facility] for [facility]. 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  Inpatient Rehab F/B HHPT.   R knee injection 10/08/22 trigger point injections for worsening back pain 2/15  RA  PAIN:  Are you having pain? No  PRECAUTIONS: None  RED FLAGS: None   WEIGHT BEARING RESTRICTIONS: No  FALLS: Has patient fallen in last 6 months? No  LIVING ENVIRONMENT: Lives with: lives with their family Lives in: House/apartment Stairs: No Has following equipment at home: Environmental consultant - 2 wheeled and Wheelchair (power)  PLOF: Independent and Independent with basic ADLs  PATIENT GOALS: I want to get out this chair and put it down in the basement   OBJECTIVE:  Note: Objective measures were completed at Evaluation unless otherwise noted.  DIAGNOSTIC FINDINGS: Right knee radiographs 11/09/2020   FINDINGS: Severe patellofemoral joint space narrowing. Severe superior and mild inferior patellar degenerative osteophytosis. Moderate superior trochlear degenerative osteophytosis. No joint effusion. Severe medial compartment joint space narrowing with moderate peripheral medial and lateral compartment degenerative osteophytes.   COGNITION: Overall cognitive status: Within functional limits for tasks assessed   SENSATION: Light touch: still has some numbness in L thigh down into the knee, toes  are also numb a little bit   EDEMA: some BLE edema   POSTURE: rounded shoulders and forward head  LOWER EXTREMITY ROM:  grossly WFL for her means, able to move better than last time she was in PT    LOWER EXTREMITY MMT:    MMT Right Eval Left Eval  Hip flexion 4 4  Hip extension    Hip abduction 4 4  Hip adduction 4 4  Hip internal rotation    Hip external rotation    Knee flexion 4 4-  Knee extension 4- 4-  Ankle dorsiflexion    Ankle plantarflexion    Ankle inversion    Ankle eversion    (Blank rows = not tested)   TRANSFERS: Assistive device utilized: Environmental consultant - 2 wheeled and Wheelchair (power)  Sit to stand: Modified independence Stand to sit: Complete Independence Chair to chair: Modified independence  GAIT: Gait pattern: step through pattern, decreased stride length, wide BOS, poor foot clearance- Right, and poor foot clearance- Left Distance walked: in clinic distances Assistive device utilized: Environmental consultant - 2 wheeled Level of assistance: Modified independence Comments: she was able to take a few steps without the walker at eval  FUNCTIONAL TESTS:  5 times sit to stand: 13.14s from elevated mat table  Timed up and go (TUG): 29s with walker  TREATMENT DATE:  04/22/24 Walking with walker ~154ft  NuStep L5x10mins  LAQ 5# 2x10 Green band HS curls 2x10 Shoulder ext and rows green 2x10   04/13/24 Recheck goals  Side steps along mat 2HHA OHP 2# 2x10 Alternating punches 2# 2x10 NuStep L5 x57mins  Walking with walker 1 big lap   03/18/24 OHP yellow ball x10 Seated horizontal abd red 2x10 Seated box taps 3# 2x10, then laterally  Ball squeeze 2x10 Seated march  Transfer from chair to mat and back  03/09/24 Recheck goals and recert Walking with walker 1 lap 17ft x2 5# LAQ and marching STS 2x10 NuStep L5x27mins    02/12/24 TUG with  walker  NuStep L5x42mins  Seated calf to toe raises 2x12 STS 2x10 Box taps 4  Walking 1 lap with walker ~12ft   02/05/24 Transfer from power chair <> mat  LAQ 2x10 HS curls green 2x10 Seated chest press with 2# WATE 2x10 Seated AAROM 2# WATE shoulder flexion 2x10 Ball squeeze 2x10      01/20/24 Recheck 5xSTS goal Fitter pushes 2x10 STS with chest press 2# 2x8 Walking ~64ft from table to NuStep w/o AD NuStep L5x85mins Taps to side of treadmill 20 reps alt  Walk from treadmill back to mat table without AD   01/08/24 Taking steps in parallel bars without UE use- forwards, backwards, side steps  Calf raises by steps 2x10  Box taps 6 holding on to railings  Walk across steps 2HRA but can alternate feet  NuStep L5x82mins    12/30/23 NuStep L5x38mins  Standing marching with RW Standing hip abd with RW 2x10 STS 2x10 Walking laps 133ft, 154ft    12/16/23- EVAL    PATIENT EDUCATION: Education details: POC, aquatic therapy  Person educated: Patient Education method: Explanation Education comprehension: verbalized understanding  HOME EXERCISE PROGRAM: [id number]   GOALS: Goals reviewed with patient? Yes  SHORT TERM GOALS: Target date: 01/20/24  Patient will be independent with initial HEP. Baseline:  Goal status: MET  2.  Patient will demonstrate improved functional LE strength as demonstrated by 5xSTS <12s. Baseline: 13.14s from elevated mat table  Goal status: 8.75s from elevated table MET 01/20/24   LONG TERM GOALS: Target date: 05/25/24  Patient will be independent with advanced/ongoing HEP to improve outcomes and carryover.  Baseline:  Goal status: INITIAL  2.  Patient will be able to ambulate 500' with LRAD with good safety to access community.  Baseline: can walk with RW Goal status: IN PROGRESS 03/09/24, ongoing 04/13/24  3.  Patient will demonstrate decreased fall risk by scoring < 20 sec on TUG. Baseline: 29s Goal status: ongoing 44s w/walker, 31s w/walker  03/09/24, 34s 04/13/24  4.  Patient will be able to walk 4ft or more with no AD Baseline: took 10 steps modI  Goal status: IN PROGRESS did 87ft 01/20/24, unable to do today 03/09/24, able to do [facility] 04/13/24  5. Patient will increase standing tolerance to 30 mins or longer to complete household and kitchen chores  Baseline: before needing to sit  Goal status: ongoing 10 mins 03/09/24, about 10 mins 04/13/24   ASSESSMENT:  CLINICAL IMPRESSION: Patient returns with stiffness and pain today. She received infusion last week and typically feels weak afterwards. Pt also reports arthritis that is flaring up due to the rain. She was able to walk with a walker today around the clinic, but standing tolerance is not very long today.   EVAL Patient is a 58 y.o. female who was seen today for  physical therapy evaluation and treatment for chronic inflammatory demyelinating polyneuropathy and Guillain-Burre syndrome. She is currently still getting around in her power chair for the most part. She reports using the walker in her home, using it to get to the bathroom and in the kitchen. States her standing tolerance is about . Patient was able to take a few steps today without an assistive device. She has made great improvements from the last time she was at our clinic. She expresses interest in doing aquatic therapy. We will set her up to do 1x land based and 1x a week aquatic therapy to be able to increase her standing and walking tolerance with and without an AD to improve her functional independence.   OBJECTIVE IMPAIRMENTS: Abnormal gait, decreased activity tolerance, decreased balance, decreased coordination, decreased endurance, difficulty walking, decreased ROM, decreased strength, postural dysfunction, and obesity.   ACTIVITY LIMITATIONS: carrying, lifting, squatting, stairs, transfers, and locomotion level  PARTICIPATION LIMITATIONS: cleaning, laundry, driving, shopping, and community  activity  PERSONAL FACTORS: Fitness, Past/current experiences, Time since onset of injury/illness/exacerbation, and 1-2 comorbidities: chronic pain, GBS are also affecting patient's functional outcome.   REHAB POTENTIAL: Good  CLINICAL DECISION MAKING: Stable/uncomplicated  EVALUATION COMPLEXITY: Low  PLAN:  PT FREQUENCY: 1-2x/week  PT DURATION: 10 weeks  PLANNED INTERVENTIONS: 97110-Therapeutic exercises, 97530- Therapeutic activity, [id number]- Neuromuscular re-education, 97535- Self Care, [id number]- Manual therapy, [phone]- Gait training, [phone]- Aquatic Therapy, Patient/Family education, Balance training, and Stair training  PLAN FOR NEXT SESSION: start with any functional tasks and working on gait and balance. Endurance training   [employer], PT 04/22/2024, 11:45 AM

## 2024-04-22 ENCOUNTER — Ambulatory Visit: Attending: Family Medicine

## 2024-04-22 DIAGNOSIS — G610 Guillain-Barre syndrome: Secondary | ICD-10-CM | POA: Diagnosis present

## 2024-04-22 DIAGNOSIS — G6181 Chronic inflammatory demyelinating polyneuritis: Secondary | ICD-10-CM | POA: Insufficient documentation

## 2024-04-22 DIAGNOSIS — R2681 Unsteadiness on feet: Secondary | ICD-10-CM | POA: Diagnosis present

## 2024-04-22 DIAGNOSIS — R262 Difficulty in walking, not elsewhere classified: Secondary | ICD-10-CM | POA: Diagnosis present

## 2024-04-22 DIAGNOSIS — R278 Other lack of coordination: Secondary | ICD-10-CM | POA: Diagnosis present

## 2024-04-22 DIAGNOSIS — R2689 Other abnormalities of gait and mobility: Secondary | ICD-10-CM | POA: Diagnosis present

## 2024-04-22 DIAGNOSIS — M6281 Muscle weakness (generalized): Secondary | ICD-10-CM | POA: Insufficient documentation

## 2024-04-22 NOTE — Telephone Encounter (Signed)
 Faxed infusion form to [employer]

## 2024-04-28 ENCOUNTER — Ambulatory Visit: Admitting: Physical Therapy

## 2024-05-04 ENCOUNTER — Ambulatory Visit

## 2024-05-11 ENCOUNTER — Other Ambulatory Visit: Payer: Self-pay | Admitting: Physical Medicine and Rehabilitation

## 2024-05-12 NOTE — Telephone Encounter (Signed)
 Rx Cyclobenzaprine  has been denied. Patient was due for a follow up visit 03/31/2023.  Message left of patient to call her PCP a refill.  Or schedule a visit with Dr. Lovorn.  (Patient is aware that Dr. Cornelio is out of the office this week).

## 2024-05-13 ENCOUNTER — Telehealth: Payer: Self-pay | Admitting: *Deleted

## 2024-05-13 NOTE — Telephone Encounter (Signed)
 Copied from CRM [id number]. Topic: General - Call Back - No Documentation >> May 13, 2024  4:14 PM Rea ORN wrote: Reason for CRM: pt called to speak with Nurse Inocente. After calling [facility], I advised pt that the nurse was not in and asked if there was something I can assist with. Pt declined my help stating she only wanted to speak with Inocente regarding sending a message to PCP to ask about a medication. I advised pt I could assist with sending a message. Pt stated she wants Inocente to call her tomorrow and then disconnected the call.

## 2024-05-14 ENCOUNTER — Telehealth: Payer: Self-pay

## 2024-05-14 NOTE — Telephone Encounter (Signed)
 Copied from CRM [id number]. Topic: Clinical - Medication Question >> May 14, 2024 10:45 AM Dana [name] wrote: Reason for CRM: Pt is wanting Dr Johnny to take over the medication cyclobenzaprine  (FLEXERIL ) 10 MG tablet so she can get a refill. She is requesting for a call back.

## 2024-05-18 ENCOUNTER — Ambulatory Visit: Attending: Family Medicine

## 2024-05-18 ENCOUNTER — Telehealth: Payer: Self-pay

## 2024-05-18 DIAGNOSIS — R2689 Other abnormalities of gait and mobility: Secondary | ICD-10-CM | POA: Insufficient documentation

## 2024-05-18 DIAGNOSIS — R262 Difficulty in walking, not elsewhere classified: Secondary | ICD-10-CM | POA: Insufficient documentation

## 2024-05-18 DIAGNOSIS — M6281 Muscle weakness (generalized): Secondary | ICD-10-CM | POA: Insufficient documentation

## 2024-05-18 DIAGNOSIS — G610 Guillain-Barre syndrome: Secondary | ICD-10-CM | POA: Insufficient documentation

## 2024-05-18 DIAGNOSIS — R278 Other lack of coordination: Secondary | ICD-10-CM | POA: Diagnosis present

## 2024-05-18 DIAGNOSIS — G6181 Chronic inflammatory demyelinating polyneuritis: Secondary | ICD-10-CM | POA: Insufficient documentation

## 2024-05-18 DIAGNOSIS — R2681 Unsteadiness on feet: Secondary | ICD-10-CM | POA: Insufficient documentation

## 2024-05-18 MED ORDER — CYCLOBENZAPRINE HCL 10 MG PO TABS: 10.0000 mg | ORAL_TABLET | Freq: Three times a day (TID) | ORAL | 5 refills | Status: AC | PRN

## 2024-05-18 NOTE — Telephone Encounter (Signed)
 Done

## 2024-05-18 NOTE — Telephone Encounter (Signed)
 Left pt a message advised to return my call in the office

## 2024-05-18 NOTE — Therapy (Signed)
 OUTPATIENT PHYSICAL THERAPY NEURO TREATMENT  Patient Name: Dana [name] MRN: [id number] DOB:[birth year], 58 y.o., female Today's Date: 05/18/2024   PCP: Garnette Olmsted REFERRING PROVIDER: Garnette Olmsted  END OF SESSION:  PT End of Session - 05/18/24 1231     Visit Number 12    Date for PT Re-Evaluation 05/25/24    Authorization Type [employer]    PT Start Time 1230    PT Stop Time 1315    PT Time Calculation (min) 45 min                    Past Medical History:  Diagnosis Date   Anxiety    Back pain with radiation    Breast mass, right    Chronic pain    Chronic, continuous use of opioids    Complication of anesthesia 04/07/14   Allergic reaction to Lisinopril  immediately following surgery   DJD (degenerative joint disease)    Fibromyalgia    Groin abscess    Headache(784.0)    History of IBS    Hypercholesterolemia    IBS (irritable bowel syndrome)    Lactose intolerance    Mild hypertension    Obesity    Tobacco use disorder    Umbilical hernia    Symptomatic   Past Surgical History:  Procedure Laterality Date   ABDOMINAL HYSTERECTOMY     ANTERIOR CERVICAL DECOMP/DISCECTOMY FUSION N/A 12/20/2015   Procedure: ANTERIOR CERVICAL DECOMPRESSION FUSION CERVICAL 4-5, CERVICAL 5-6, CERVICAL 6-7 WITH INSTRUMENTATION AND ALLOGRAFT;  Surgeon: Oneil Priestly, MD;  Location: [facility] OR;  Service: Orthopedics;  Laterality: N/A;  Anterior cervical decompression fusion, cervical 4-5, cervical 5-6, cervical 6-7 with instrumentation and allograft   BACK SURGERY     BILATERAL SALPINGECTOMY  09/03/2012   Procedure: BILATERAL SALPINGECTOMY;  Surgeon: Curlee VEAR Guan, MD;  Location: [facility] ORS;  Service: Gynecology;  Laterality: Bilateral;   BREAST BIOPSY Right 04/07/2014   Procedure: REMOVAL RIGHT BREAST MASS WITH WIRE LOCALIZATION;  Surgeon: Krystal JINNY Russell, MD;  Location: [facility] OR;  Service: General;  Laterality: Right;   BREAST EXCISIONAL BIOPSY Left    x2   BREAST LUMPECTOMY     x2    CARPAL TUNNEL RELEASE Left 05/11/2019   Procedure: LEFT CARPAL TUNNEL RELEASE, RIGHT TENNIS ELBOW MARCAINE /DEPO MEDROL  INJECTION UNDER ANESTHESIA;  Surgeon: Lucilla Lynwood BRAVO, MD;  Location: [facility] OR;  Service: Orthopedics;  Laterality: Left;   COLONOSCOPY W/ BIOPSIES  04/24/2012   per Dr. Avram, clear, repeat in 10 yrs    disectomy     ESOPHAGOGASTRODUODENOSCOPY     FINGER SURGERY     Right index-excision of mass    FOOT SURGERY Right    Bone Spurs   IR FLUORO GUIDE CV LINE RIGHT  01/29/2023   IR REMOVAL TUN CV CATH W/O FL  02/08/2023   IR US  GUIDE VASC ACCESS RIGHT  01/31/2023   LAPAROSCOPIC HYSTERECTOMY  09/03/2012   Procedure: HYSTERECTOMY TOTAL LAPAROSCOPIC;  Surgeon: Curlee VEAR Guan, MD;  Location: [facility] OR[facility];  Service: Gynecology;  Laterality: N/A;   LUMBAR DISC SURGERY     TUBAL LIGATION     UMBILICAL HERNIA REPAIR N/A 05/01/2018   Procedure: UMBILICAL HERNIA REPAIR;  Surgeon: Kimble Lynwood, MD;  Location: [facility] OR;  Service: General;  Laterality: N/A;   Patient Active Problem List   Diagnosis Date Noted   Edema 11/20/2023   Hyperkeratosis of sole 08/01/2023   CIDP (chronic inflammatory demyelinating polyneuropathy) (HCC) 01/28/2023   Gait abnormality 01/28/2023  Glaucoma 01/28/2023   Pain due to onychomycosis of toenails of both feet 01/21/2023   Wheelchair dependence 12/30/2022   Neuropathic pain 12/30/2022   Insomnia due to medical condition 12/30/2022   Acute inflammatory demyelinating polyneuropathy (HCC) 10/01/2022   UTI (urinary tract infection) 09/24/2022   Hypomagnesemia 09/24/2022   Weakness 09/23/2022   Hypokalemia 09/23/2022   B12 deficiency 09/05/2022   Folate deficiency 09/05/2022   Carpal tunnel syndrome, left upper limb 05/11/2019    Class: Chronic   Spinal stenosis of lumbar region with neurogenic claudication 08/20/2018   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   Primary osteoarthritis of right knee 02/27/2017   Right tennis elbow 11/11/2016   Muscle cramps  01/15/2016   Bilateral leg edema 01/08/2016   Radiculopathy 12/20/2015   Hyperglycemia 12/21/2014   Mastodynia, female 11/30/2014   S/P excision of fibroadenoma of breast 11/30/2014   Angioedema of lips 04/07/2014   Fibroadenoma of right breast 03/07/2014   Pelvic pain in female 06/19/2012   Menorrhagia 06/11/2012   SUI (stress urinary incontinence, female) 06/11/2012   HTN (hypertension) 06/11/2012   IBS (irritable bowel syndrome) - diarrhea predominant 03/17/2012   MICROSCOPIC HEMATURIA 11/30/2010   BREAST PAIN, RIGHT 11/30/2010   BREAST MASS, RIGHT 01/12/2010   HYPERCHOLESTEROLEMIA 12/07/2007   CIGARETTE SMOKER 12/07/2007   Osteoarthritis 12/07/2007   Low back pain with sciatica 12/07/2007   Headache 12/07/2007   Obesity, Class III, BMI 40-49.9 (morbid obesity) 08/03/2007   Anxiety state 08/03/2007    ONSET DATE: 12/05/22  REFERRING DIAG:  G89.4 (ICD-10-CM) - Chronic pain syndrome    THERAPY DIAG:  Guillain Barr syndrome (HCC)  CIDP (chronic inflammatory demyelinating polyneuropathy) (HCC)  Difficulty in walking, not elsewhere classified  Muscle weakness (generalized)  Other abnormalities of gait and mobility  Unsteadiness on feet  Rationale for Evaluation and Treatment: Rehabilitation  SUBJECTIVE:                                                                                                                                                                                             SUBJECTIVE STATEMENT: I have been doing okay. After the 11th I won't be here anymore, I am having surgery on my eyes. So we need to schedule out further. The R knee is hurting.    PERTINENT HISTORY: Pt Continuation for PT due to chronic inflammatory demyelinating polyneuropathy   01/28/23 ASSESSMENT AND PLAN   Dana [name] is a 58 y.o. female   Demyelinating polyradiculoneuropathy             Symptom onset monophasic since September 2024, failed to respond to IVIG in January  2024,             EMG nerve conduction study showed demyelinating features, no evidence of axonal loss,             Talk with neurohospitalist Dr. Jerrie, ED triage, will send her for hospital admission for plasma exchange, please add on following labs, immunofixative protein electrophoresis, ANA with reflex, SSA, SSB titer, iron panel             Starting outpatient IVIG prior authorization process,   LACHRISTA HESLIN is a 58 y.o. female who [facility] the [facility] ED on 09/23/2022 with bilateral lower extremity weakness with decreased mobility that has progressed to both arms. She also reported numbness and tingling of the extremities. She was transferred to [facility] for MRI. 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  Inpatient Rehab F/B HHPT.   R knee injection 10/08/22 trigger point injections for worsening back pain 2/15  RA  PAIN:  Are you having pain? No  PRECAUTIONS: None  RED FLAGS: None   WEIGHT BEARING RESTRICTIONS: No  FALLS: Has patient fallen in last 6 months? No  LIVING ENVIRONMENT: Lives with: lives with their family Lives in: House/apartment Stairs: No Has following equipment at home: Environmental consultant - 2 wheeled and Wheelchair (power)  PLOF: Independent and Independent with basic ADLs  PATIENT GOALS: I want to get out this chair and put it down in the basement   OBJECTIVE:  Note: Objective measures were completed at Evaluation unless otherwise noted.  DIAGNOSTIC FINDINGS: Right knee radiographs 11/09/2020   FINDINGS: Severe patellofemoral joint space narrowing. Severe superior and mild inferior patellar degenerative osteophytosis. Moderate superior trochlear degenerative osteophytosis. No joint effusion. Severe medial compartment joint space narrowing with moderate peripheral medial and lateral compartment degenerative osteophytes.   COGNITION: Overall cognitive status: Within functional limits for tasks assessed   SENSATION: Light touch: still  has some numbness in L thigh down into the knee, toes are also numb a little bit   EDEMA: some BLE edema   POSTURE: rounded shoulders and forward head  LOWER EXTREMITY ROM:  grossly WFL for her means, able to move better than last time she was in PT    LOWER EXTREMITY MMT:    MMT Right Eval Left Eval  Hip flexion 4 4  Hip extension    Hip abduction 4 4  Hip adduction 4 4  Hip internal rotation    Hip external rotation    Knee flexion 4 4-  Knee extension 4- 4-  Ankle dorsiflexion    Ankle plantarflexion    Ankle inversion    Ankle eversion    (Blank rows = not tested)   TRANSFERS: Assistive device utilized: Environmental consultant - 2 wheeled and Wheelchair (power)  Sit to stand: Modified independence Stand to sit: Complete Independence Chair to chair: Modified independence  GAIT: Gait pattern: step through pattern, decreased stride length, wide BOS, poor foot clearance- Right, and poor foot clearance- Left Distance walked: in clinic distances Assistive device utilized: Environmental consultant - 2 wheeled Level of assistance: Modified independence Comments: she was able to take a few steps without the walker at eval  FUNCTIONAL TESTS:  5 times sit to stand: 13.14s from elevated mat table  Timed up and go (TUG): 29s with walker  TREATMENT DATE:  05/18/24 Recheck goals again Walk from table to Nustep 1HHA Nustep L5 x47mins  Walk back from NuStep to table no assistance  Standing march with walker  STS 2x10 Chest press to OHP with yellow ball 2x10   04/22/24 Walking with walker ~172ft  NuStep L5x63mins  LAQ 5# 2x10 Green band HS curls 2x10 Shoulder ext and rows green 2x10   04/13/24 Recheck goals  Side steps along mat 2HHA OHP 2# 2x10 Alternating punches 2# 2x10 NuStep L5 x66mins  Walking with walker 1 big lap   03/18/24 OHP yellow ball x10 Seated horizontal abd  red 2x10 Seated box taps 3# 2x10, then laterally  Ball squeeze 2x10 Seated march  Transfer from chair to mat and back  03/09/24 Recheck goals and recert Walking with walker 1 lap 137ft x2 5# LAQ and marching STS 2x10 NuStep L5x67mins    02/12/24 TUG with walker  NuStep L5x78mins  Seated calf to toe raises 2x12 STS 2x10 Box taps 4  Walking 1 lap with walker ~13ft   02/05/24 Transfer from power chair <> mat  LAQ 2x10 HS curls green 2x10 Seated chest press with 2# WATE 2x10 Seated AAROM 2# WATE shoulder flexion 2x10 Ball squeeze 2x10      01/20/24 Recheck 5xSTS goal Fitter pushes 2x10 STS with chest press 2# 2x8 Walking ~23ft from table to NuStep w/o AD NuStep L5x80mins Taps to side of treadmill 20 reps alt  Walk from treadmill back to mat table without AD   01/08/24 Taking steps in parallel bars without UE use- forwards, backwards, side steps  Calf raises by steps 2x10  Box taps 6 holding on to railings  Walk across steps 2HRA but can alternate feet  NuStep L5x53mins    12/30/23 NuStep L5x50mins  Standing marching with RW Standing hip abd with RW 2x10 STS 2x10 Walking laps 140ft, 166ft    12/16/23- EVAL    PATIENT EDUCATION: Education details: POC, aquatic therapy  Person educated: Patient Education method: Explanation Education comprehension: verbalized understanding  HOME EXERCISE PROGRAM: [id number]   GOALS: Goals reviewed with patient? Yes  SHORT TERM GOALS: Target date: 01/20/24  Patient will be independent with initial HEP. Baseline:  Goal status: MET  2.  Patient will demonstrate improved functional LE strength as demonstrated by 5xSTS <12s. Baseline: 13.14s from elevated mat table  Goal status: 8.75s from elevated table MET 01/20/24   LONG TERM GOALS: Target date: 05/25/24  Patient will be independent with advanced/ongoing HEP to improve outcomes and carryover.  Baseline:  Goal status: INITIAL  2.  Patient will be able to ambulate 500' with  LRAD with good safety to access community.  Baseline: can walk with RW Goal status: IN PROGRESS 03/09/24, ongoing 04/13/24, progressing 05/18/24  3.  Patient will demonstrate decreased fall risk by scoring < 20 sec on TUG. Baseline: 29s Goal status: ongoing 44s w/walker, 31s w/walker 03/09/24, 34s 04/13/24, 30s 05/18/24   4.  Patient will be able to walk 67ft or more with no AD Baseline: took 10 steps modI  Goal status: IN PROGRESS did 50ft 01/20/24, unable to do today 03/09/24, able to do 1HHA 04/13/24, MET 05/18/24  5. Patient will increase standing tolerance to 30 mins or longer to complete household and kitchen chores  Baseline: before needing to sit  Goal status: ongoing 10 mins 03/09/24, about 10 mins 04/13/24, 15-20 mins 05/18/24   ASSESSMENT:  CLINICAL IMPRESSION: Patient returns some improvement in overall function. She reports starting to do some  exercises at home including sit ups and leg lifts. Patient was able to walk a few feet without the AD, although it is quick steps she is able to get short distances without the walker. With standing marches she has some pain in her R knee.   EVAL Patient is a 58 y.o. 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 She is currently still getting around in her power chair for the most part. She reports using the walker in her home, using it to get to the bathroom and in the kitchen. States her standing tolerance is about . Patient was able to take a few steps today without an assistive device. She has made great improvements from the last time she was at our clinic. She expresses interest in doing aquatic therapy. We will set her up to do 1x land based and 1x a week aquatic therapy to be able to increase her standing and walking tolerance with and without an AD to improve her functional independence.   OBJECTIVE IMPAIRMENTS: Abnormal  gait, decreased activity tolerance, decreased balance, decreased coordination, decreased endurance, difficulty walking, decreased ROM, decreased strength, postural dysfunction, and obesity.   ACTIVITY LIMITATIONS: carrying, lifting, squatting, stairs, transfers, and locomotion level  PARTICIPATION LIMITATIONS: cleaning, laundry, driving, shopping, and community activity  PERSONAL FACTORS: Fitness, Past/current experiences, Time since onset of injury/illness/exacerbation, and 1-2 comorbidities: chronic pain, GBS are also affecting patient's functional outcome.   REHAB POTENTIAL: Good  CLINICAL DECISION MAKING: Stable/uncomplicated  EVALUATION COMPLEXITY: Low  PLAN:  PT FREQUENCY: 1-2x/week  PT DURATION: 10 weeks  PLANNED INTERVENTIONS: 97110-Therapeutic exercises, 97530- Therapeutic activity, [id number]- Neuromuscular re-education, 97535- Self Care, [id number]- Manual therapy, [phone]- Gait training, [phone]- Aquatic Therapy, Patient/Family education, Balance training, and Stair training  PLAN FOR NEXT SESSION: start with any functional tasks and working on gait and balance. Endurance training   [employer], PT 05/18/2024, 1:15 PM

## 2024-05-18 NOTE — Telephone Encounter (Signed)
 Pt.notified

## 2024-05-18 NOTE — Telephone Encounter (Signed)
 Returned pt call advised that Dr Johnny  sent the refill for Flexeril  to her pharmacy, voiced understanding

## 2024-05-18 NOTE — Addendum Note (Signed)
 Addended by: JOHNNY SENIOR A on: 05/18/2024 11:10 AM   Modules accepted: Orders

## 2024-05-18 NOTE — Telephone Encounter (Signed)
 Surgical clearance form faxed to [facility] AT [phone]

## 2024-05-20 ENCOUNTER — Ambulatory Visit

## 2024-05-20 DIAGNOSIS — G610 Guillain-Barre syndrome: Secondary | ICD-10-CM | POA: Diagnosis not present

## 2024-05-20 DIAGNOSIS — R2689 Other abnormalities of gait and mobility: Secondary | ICD-10-CM

## 2024-05-20 DIAGNOSIS — R2681 Unsteadiness on feet: Secondary | ICD-10-CM

## 2024-05-20 DIAGNOSIS — M6281 Muscle weakness (generalized): Secondary | ICD-10-CM

## 2024-05-20 DIAGNOSIS — R278 Other lack of coordination: Secondary | ICD-10-CM

## 2024-05-20 DIAGNOSIS — R262 Difficulty in walking, not elsewhere classified: Secondary | ICD-10-CM

## 2024-05-20 DIAGNOSIS — G6181 Chronic inflammatory demyelinating polyneuritis: Secondary | ICD-10-CM

## 2024-05-20 NOTE — Therapy (Signed)
 OUTPATIENT PHYSICAL THERAPY NEURO TREATMENT  Patient Name: Dana [name] MRN: [id number] DOB:Dec 31, 1965, 58 y.o., female Today's Date: 05/20/2024   PCP: Garnette Olmsted REFERRING PROVIDER: Garnette Olmsted  END OF SESSION:  PT End of Session - 05/20/24 1213     Visit Number 13    Date for PT Re-Evaluation 08/12/24    Authorization Type [employer]    PT Start Time 1212    PT Stop Time 1255    PT Time Calculation (min) 43 min                     Past Medical History:  Diagnosis Date   Anxiety    Back pain with radiation    Breast mass, right    Chronic pain    Chronic, continuous use of opioids    Complication of anesthesia 04/07/14   Allergic reaction to Lisinopril  immediately following surgery   DJD (degenerative joint disease)    Fibromyalgia    Groin abscess    Headache(784.0)    History of IBS    Hypercholesterolemia    IBS (irritable bowel syndrome)    Lactose intolerance    Mild hypertension    Obesity    Tobacco use disorder    Umbilical hernia    Symptomatic   Past Surgical History:  Procedure Laterality Date   ABDOMINAL HYSTERECTOMY     ANTERIOR CERVICAL DECOMP/DISCECTOMY FUSION N/A 12/20/2015   Procedure: ANTERIOR CERVICAL DECOMPRESSION FUSION CERVICAL 4-5, CERVICAL 5-6, CERVICAL 6-7 WITH INSTRUMENTATION AND ALLOGRAFT;  Surgeon: Oneil Priestly, MD;  Location: [facility] OR;  Service: Orthopedics;  Laterality: N/A;  Anterior cervical decompression fusion, cervical 4-5, cervical 5-6, cervical 6-7 with instrumentation and allograft   BACK SURGERY     BILATERAL SALPINGECTOMY  09/03/2012   Procedure: BILATERAL SALPINGECTOMY;  Surgeon: Curlee VEAR Guan, MD;  Location: [facility];  Service: Gynecology;  Laterality: Bilateral;   BREAST BIOPSY Right 04/07/2014   Procedure: REMOVAL RIGHT BREAST MASS WITH WIRE LOCALIZATION;  Surgeon: Krystal JINNY Russell, MD;  Location: [facility] OR;  Service: General;  Laterality: Right;   BREAST EXCISIONAL BIOPSY Left    x2   BREAST LUMPECTOMY     x2    CARPAL TUNNEL RELEASE Left 05/11/2019   Procedure: LEFT CARPAL TUNNEL RELEASE, RIGHT TENNIS ELBOW MARCAINE /DEPO MEDROL  INJECTION UNDER ANESTHESIA;  Surgeon: Lucilla Lynwood BRAVO, MD;  Location: [facility] OR;  Service: Orthopedics;  Laterality: Left;   COLONOSCOPY W/ BIOPSIES  04/24/2012   per Dr. Avram, clear, repeat in 10 yrs    disectomy     ESOPHAGOGASTRODUODENOSCOPY     FINGER SURGERY     Right index-excision of mass    FOOT SURGERY Right    Bone Spurs   IR FLUORO GUIDE CV LINE RIGHT  01/29/2023   IR REMOVAL TUN CV CATH W/O FL  02/08/2023   IR US  GUIDE VASC ACCESS RIGHT  01/31/2023   LAPAROSCOPIC HYSTERECTOMY  09/03/2012   Procedure: HYSTERECTOMY TOTAL LAPAROSCOPIC;  Surgeon: Curlee VEAR Guan, MD;  Location: [facility];  Service: Gynecology;  Laterality: N/A;   LUMBAR DISC SURGERY     TUBAL LIGATION     UMBILICAL HERNIA REPAIR N/A 05/01/2018   Procedure: UMBILICAL HERNIA REPAIR;  Surgeon: Kimble Lynwood, MD;  Location: [facility] OR;  Service: General;  Laterality: N/A;   Patient Active Problem List   Diagnosis Date Noted   Edema 11/20/2023   Hyperkeratosis of sole 08/01/2023   CIDP (chronic inflammatory demyelinating polyneuropathy) (HCC) 01/28/2023   Gait abnormality 01/28/2023  Glaucoma 01/28/2023   Pain due to onychomycosis of toenails of both feet 01/21/2023   Wheelchair dependence 12/30/2022   Neuropathic pain 12/30/2022   Insomnia due to medical condition 12/30/2022   Acute inflammatory demyelinating polyneuropathy (HCC) 10/01/2022   UTI (urinary tract infection) 09/24/2022   Hypomagnesemia 09/24/2022   Weakness 09/23/2022   Hypokalemia 09/23/2022   B12 deficiency 09/05/2022   Folate deficiency 09/05/2022   Carpal tunnel syndrome, left upper limb 05/11/2019    Class: Chronic   Spinal stenosis of lumbar region with neurogenic claudication 08/20/2018   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   Primary osteoarthritis of right knee 02/27/2017   Right tennis elbow 11/11/2016   Muscle cramps  01/15/2016   Bilateral leg edema 01/08/2016   Radiculopathy 12/20/2015   Hyperglycemia 12/21/2014   Mastodynia, female 11/30/2014   S/P excision of fibroadenoma of breast 11/30/2014   Angioedema of lips 04/07/2014   Fibroadenoma of right breast 03/07/2014   Pelvic pain in female 06/19/2012   Menorrhagia 06/11/2012   SUI (stress urinary incontinence, female) 06/11/2012   HTN (hypertension) 06/11/2012   IBS (irritable bowel syndrome) - diarrhea predominant 03/17/2012   MICROSCOPIC HEMATURIA 11/30/2010   BREAST PAIN, RIGHT 11/30/2010   BREAST MASS, RIGHT 01/12/2010   HYPERCHOLESTEROLEMIA 12/07/2007   CIGARETTE SMOKER 12/07/2007   Osteoarthritis 12/07/2007   Low back pain with sciatica 12/07/2007   Headache 12/07/2007   Obesity, Class III, BMI 40-49.9 (morbid obesity) 08/03/2007   Anxiety state 08/03/2007    ONSET DATE: 12/05/22  REFERRING DIAG:  G89.4 (ICD-10-CM) - Chronic pain syndrome    THERAPY DIAG:  Guillain Barr syndrome (HCC)  CIDP (chronic inflammatory demyelinating polyneuropathy) (HCC)  Difficulty in walking, not elsewhere classified  Muscle weakness (generalized)  Unsteadiness on feet  Other lack of coordination  Other abnormalities of gait and mobility  Rationale for Evaluation and Treatment: Rehabilitation  SUBJECTIVE:                                                                                                                                                                                             SUBJECTIVE STATEMENT: I am doing alright.    PERTINENT HISTORY: Pt Continuation for PT due to chronic inflammatory demyelinating polyneuropathy   01/28/23 ASSESSMENT AND PLAN   Dana [name] is a 58 y.o. female   Demyelinating polyradiculoneuropathy             Symptom onset monophasic since September 2024, failed to respond to IVIG in January 2024,             EMG nerve conduction study showed demyelinating features, no evidence of axonal  loss,             Talk with neurohospitalist Dr. Jerrie, ED triage, will send her for hospital admission for plasma exchange, please add on following labs, immunofixative protein electrophoresis, ANA with reflex, SSA, SSB titer, iron panel             Starting outpatient IVIG prior authorization process,   REYLYNN VANALSTINE is a 59 y.o. female who presented to the [facility] ED on 09/23/2022 with bilateral lower extremity weakness with decreased mobility that has progressed to both arms. She also reported numbness and tingling of the extremities. She was transferred to [facility] for MRI. 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  Inpatient Rehab F/B HHPT.   R knee injection 10/08/22 trigger point injections for worsening back pain 2/15  RA  PAIN:  Are you having pain? No  PRECAUTIONS: None  RED FLAGS: None   WEIGHT BEARING RESTRICTIONS: No  FALLS: Has patient fallen in last 6 months? No  LIVING ENVIRONMENT: Lives with: lives with their family Lives in: House/apartment Stairs: No Has following equipment at home: Environmental consultant - 2 wheeled and Wheelchair (power)  PLOF: Independent and Independent with basic ADLs  PATIENT GOALS: I want to get out this chair and put it down in the basement   OBJECTIVE:  Note: Objective measures were completed at Evaluation unless otherwise noted.  DIAGNOSTIC FINDINGS: Right knee radiographs 11/09/2020   FINDINGS: Severe patellofemoral joint space narrowing. Severe superior and mild inferior patellar degenerative osteophytosis. Moderate superior trochlear degenerative osteophytosis. No joint effusion. Severe medial compartment joint space narrowing with moderate peripheral medial and lateral compartment degenerative osteophytes.   COGNITION: Overall cognitive status: Within functional limits for tasks assessed   SENSATION: Light touch: still has some numbness in L thigh down into the knee, toes are also numb a little bit   EDEMA: some BLE  edema   POSTURE: rounded shoulders and forward head  LOWER EXTREMITY ROM:  grossly WFL for her means, able to move better than last time she was in PT    LOWER EXTREMITY MMT:    MMT Right Eval Left Eval  Hip flexion 4 4  Hip extension    Hip abduction 4 4  Hip adduction 4 4  Hip internal rotation    Hip external rotation    Knee flexion 4 4-  Knee extension 4- 4-  Ankle dorsiflexion    Ankle plantarflexion    Ankle inversion    Ankle eversion    (Blank rows = not tested)   TRANSFERS: Assistive device utilized: Environmental consultant - 2 wheeled and Wheelchair (power)  Sit to stand: Modified independence Stand to sit: Complete Independence Chair to chair: Modified independence  GAIT: Gait pattern: step through pattern, decreased stride length, wide BOS, poor foot clearance- Right, and poor foot clearance- Left Distance walked: in clinic distances Assistive device utilized: Environmental consultant - 2 wheeled Level of assistance: Modified independence Comments: she was able to take a few steps without the walker at eval  FUNCTIONAL TESTS:  5 times sit to stand: 13.14s from elevated mat table  Timed up and go (TUG): 29s with walker  TREATMENT DATE:  05/20/24 RECERT NuStep [id number]  STS 2x10 Side steps in front of mat table 2 HHA Standing alt punches with 3#  4 taps Up and down 4 steps   05/18/24 Recheck goals again Walk from table to Nustep 1HHA Nustep L5 x35mins  Walk back from NuStep to table no assistance  Standing march with walker  STS 2x10 Chest press to OHP with yellow ball 2x10   04/22/24 Walking with walker ~166ft  NuStep L5x99mins  LAQ 5# 2x10 Green band HS curls 2x10 Shoulder ext and rows green 2x10   04/13/24 Recheck goals  Side steps along mat 2HHA OHP 2# 2x10 Alternating punches 2# 2x10 NuStep L5 x76mins  Walking with walker 1 big  lap   03/18/24 OHP yellow ball x10 Seated horizontal abd red 2x10 Seated box taps 3# 2x10, then laterally  Ball squeeze 2x10 Seated march  Transfer from chair to mat and back  03/09/24 Recheck goals and recert Walking with walker 1 lap 194ft x2 5# LAQ and marching STS 2x10 NuStep L5x16mins    02/12/24 TUG with walker  NuStep L5x4mins  Seated calf to toe raises 2x12 STS 2x10 Box taps 4  Walking 1 lap with walker ~178ft   02/05/24 Transfer from power chair <> mat  LAQ 2x10 HS curls green 2x10 Seated chest press with 2# WATE 2x10 Seated AAROM 2# WATE shoulder flexion 2x10 Ball squeeze 2x10      01/20/24 Recheck 5xSTS goal Fitter pushes 2x10 STS with chest press 2# 2x8 Walking ~78ft from table to NuStep w/o AD NuStep L5x53mins Taps to side of treadmill 20 reps alt  Walk from treadmill back to mat table without AD   01/08/24 Taking steps in parallel bars without UE use- forwards, backwards, side steps  Calf raises by steps 2x10  Box taps 6 holding on to railings  Walk across steps 2HRA but can alternate feet  NuStep L5x34mins    12/30/23 NuStep L5x70mins  Standing marching with RW Standing hip abd with RW 2x10 STS 2x10 Walking laps 137ft, 133ft    12/16/23- EVAL    PATIENT EDUCATION: Education details: POC, aquatic therapy  Person educated: Patient Education method: Explanation Education comprehension: verbalized understanding  HOME EXERCISE PROGRAM: [id number]   GOALS: Goals reviewed with patient? Yes  SHORT TERM GOALS: Target date: 01/20/24  Patient will be independent with initial HEP. Baseline:  Goal status: MET  2.  Patient will demonstrate improved functional LE strength as demonstrated by 5xSTS <12s. Baseline: 13.14s from elevated mat table  Goal status: 8.75s from elevated table MET 01/20/24   LONG TERM GOALS: Target date: 08/12/24  Patient will be independent with advanced/ongoing HEP to improve outcomes and carryover.  Baseline:  Goal  status: INITIAL  2.  Patient will be able to ambulate 500' with LRAD with good safety to access community.  Baseline: can walk with RW Goal status: IN PROGRESS 03/09/24, ongoing 04/13/24, progressing 05/18/24  3.  Patient will demonstrate decreased fall risk by scoring < 20 sec on TUG. Baseline: 29s Goal status: ongoing 44s w/walker, 31s w/walker 03/09/24, 34s 04/13/24, 30s 05/18/24   4.  Patient will be able to walk 104ft or more with no AD Baseline: took 10 steps modI  Goal status: IN PROGRESS did 62ft 01/20/24, unable to do today 03/09/24, able to do 1HHA 04/13/24, MET 05/18/24  5. Patient will increase standing tolerance to 30 mins or longer to complete household and kitchen chores  Baseline: before needing to sit  Goal  status: ongoing 10 mins 03/09/24, about 10 mins 04/13/24, 15-20 mins 05/18/24   ASSESSMENT:  CLINICAL IMPRESSION: Patient returns some improvement in overall function. She states wanting to try walking with a cane soon. Was advised maybe to keep practicing more standing activities and with the walker before we progress. She is able to take some steps with HHA and some on her own. Fatigue with step ups and some SOB, needed an extended break. Will continue to progress. Recerted for another 12 weeks so she can finish up with her remaining visits for the year.    EVAL Patient is a 58 y.o. 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 She is currently still getting around in her power chair for the most part. She reports using the walker in her home, using it to get to the bathroom and in the kitchen. States her standing tolerance is about . Patient was able to take a few steps today without an assistive device. She has made great improvements from the last time she was at our clinic. She expresses interest in doing aquatic therapy. We will set her up to do 1x land based  and 1x a week aquatic therapy to be able to increase her standing and walking tolerance with and without an AD to improve her functional independence.   OBJECTIVE IMPAIRMENTS: Abnormal gait, decreased activity tolerance, decreased balance, decreased coordination, decreased endurance, difficulty walking, decreased ROM, decreased strength, postural dysfunction, and obesity.   ACTIVITY LIMITATIONS: carrying, lifting, squatting, stairs, transfers, and locomotion level  PARTICIPATION LIMITATIONS: cleaning, laundry, driving, shopping, and community activity  PERSONAL FACTORS: Fitness, Past/current experiences, Time since onset of injury/illness/exacerbation, and 1-2 comorbidities: chronic pain, GBS are also affecting patient's functional outcome.   REHAB POTENTIAL: Good  CLINICAL DECISION MAKING: Stable/uncomplicated  EVALUATION COMPLEXITY: Low  PLAN:  PT FREQUENCY: 1-2x/week  PT DURATION: 10 weeks  PLANNED INTERVENTIONS: 97110-Therapeutic exercises, 97530- Therapeutic activity, [id number]- Neuromuscular re-education, 97535- Self Care, [id number]- Manual therapy, [phone]- Gait training, [phone]- Aquatic Therapy, Patient/Family education, Balance training, and Stair training  PLAN FOR NEXT SESSION: start with any functional tasks and working on gait and balance. Endurance training   [employer], PT 05/20/2024, 12:55 PM

## 2024-05-26 ENCOUNTER — Ambulatory Visit (INDEPENDENT_AMBULATORY_CARE_PROVIDER_SITE_OTHER)

## 2024-05-26 VITALS — Ht 64.0 in | Wt 300.0 lb

## 2024-05-26 DIAGNOSIS — Z0000 Encounter for general adult medical examination without abnormal findings: Secondary | ICD-10-CM

## 2024-05-26 NOTE — Progress Notes (Signed)
 Subjective:   Dana [name] is a 58 y.o. who presents for a Medicare Wellness preventive visit.  As a reminder, Annual Wellness Visits don't include a physical exam, and some assessments may be limited, especially if this visit is performed virtually. We may recommend an in-person follow-up visit with your provider if needed.  Visit Complete: Virtual I connected with  Dana [name] on 05/26/24 by a audio enabled telemedicine application and verified that I am speaking with the correct person using two identifiers.  Patient Location: Home  Provider Location: Home Office  I discussed the limitations of evaluation and management by telemedicine. The patient expressed understanding and agreed to proceed.  Vital Signs: Because this visit was a virtual/telehealth visit, some criteria may be missing or patient reported. Any vitals not documented were not able to be obtained and vitals that have been documented are patient reported.    Persons Participating in Visit: Patient.  AWV Questionnaire: No: Patient Medicare AWV questionnaire was not completed prior to this visit.  Cardiac Risk Factors include: advanced age (>28men, >81 women);hypertension     Objective:    Today's Vitals   05/26/24 1409  Weight: 300 lb (136.1 kg)  Height: 5' 4 (1.626 m)   Body mass index is 51.49 kg/m.     05/26/2024    2:19 PM 12/16/2023   12:17 PM 01/28/2023    6:27 PM 12/12/2022    8:53 AM 10/02/2022    6:00 PM 09/24/2022    3:00 PM 09/23/2022    7:22 PM  Advanced Directives  Does Patient Have a Medical Advance Directive? No No No No No No No  Would patient like information on creating a medical advance directive? No - Patient declined  No - Patient declined  No - Patient declined No - Patient declined     Current Medications (verified) Outpatient Encounter Medications as of 05/26/2024  Medication Sig   ALPRAZolam  (XANAX ) 0.5 MG tablet Take 1 tablet (0.5 mg total) by mouth 2 (two) times daily as  needed for anxiety.   ammonium lactate  (LAC-HYDRIN ) 12 % lotion APPLY TOPICALLY IN THE MORNING AND AT BEDTIME   benzonatate  (TESSALON ) 200 MG capsule TAKE 1 CAPSULE (200 MG TOTAL) BY MOUTH EVERY 6 (SIX) HOURS AS NEEDED FOR COUGH.   celecoxib  (CELEBREX ) 100 MG capsule Take 1 capsule (100 mg total) by mouth 2 (two) times daily.   cephALEXin  (KEFLEX ) 500 MG capsule Take 1 capsule (500 mg total) by mouth 4 (four) times daily.   Cyanocobalamin  (VITAMIN B-12 PO) Take 1 tablet by mouth daily.   cyclobenzaprine  (FLEXERIL ) 10 MG tablet Take 1 tablet (10 mg total) by mouth 3 (three) times daily as needed for muscle spasms.   dorzolamide -timolol  (COSOPT ) 2-0.5 % ophthalmic solution Place 1 drop into both eyes 2 (two) times daily.   DULoxetine  (CYMBALTA ) 60 MG capsule TAKE 1 CAPSULE BY MOUTH EVERY DAY   famotidine  (PEPCID ) 20 MG tablet Take 20 mg by mouth 2 (two) times daily.   fluticasone  (FLONASE ) 50 MCG/ACT nasal spray SPRAY 2 SPRAYS INTO EACH NOSTRIL EVERY DAY   folic acid  (FOLVITE ) 1 MG tablet TAKE 1 TABLET BY MOUTH EVERY DAY   furosemide  (LASIX ) 40 MG tablet TAKE 1 TABLET BY MOUTH TWICE A DAY   gabapentin  (NEURONTIN ) 300 MG capsule Take 3 capsules (900 mg total) by mouth 3 (three) times daily.   hydrocerin (EUCERIN) CREA Apply 1 Application topically 2 (two) times daily.   ibuprofen  (ADVIL ) 200 MG tablet Take 400  mg by mouth 2 (two) times daily as needed for headache or moderate pain.   Immune Globulin , Human, (OCTAGAM IV) Inject 85 g into the vein every 21 ( twenty-one) days. 10% octagam   latanoprost  (XALATAN ) 0.005 % ophthalmic solution Place 1 drop into both eyes at bedtime.   magnesium  oxide (MAG-OX) 400 MG tablet Take 1 tablet (400 mg total) by mouth at bedtime. (Patient taking differently: Take 200 mg by mouth at bedtime.)   metoprolol  tartrate (LOPRESSOR ) 50 MG tablet TAKE 1 TABLET BY MOUTH TWICE A DAY   Multiple Vitamin (MULTIVITAMIN WITH MINERALS) TABS tablet Take 1 tablet by mouth daily.    Oxycodone  HCl 20 MG TABS Take 1 tablet (20 mg total) by mouth every 6 (six) hours as needed (pain).   Oxycodone  HCl 20 MG TABS Take 1 tablet (20 mg total) by mouth every 6 (six) hours as needed (pain).   Oxycodone  HCl 20 MG TABS Take 1 tablet (20 mg total) by mouth every 6 (six) hours as needed (pain).   pantoprazole  (PROTONIX ) 40 MG tablet TAKE 1 TABLET BY MOUTH EVERY DAY   potassium chloride  (KLOR-CON ) 10 MEQ tablet TAKE 1 TABLET BY MOUTH 2 TIMES DAILY.   promethazine  (PHENERGAN ) 25 MG tablet TAKE 1 TABLET (25 MG TOTAL) BY MOUTH EVERY 4 HOURS AS NEEDED FOR NAUSEA AND VOMITING   senna-docusate (SENOKOT-S) 8.6-50 MG tablet Take 1 tablet by mouth 2 (two) times daily. (Patient taking differently: Take 1 tablet by mouth 2 (two) times daily as needed for moderate constipation.)   traZODone  (DESYREL ) 50 MG tablet TAKE 1 TO 2 TABLETS BY MOUTH AT BEDTIME AS NEEDED FOR SLEEP   No facility-administered encounter medications on file as of 05/26/2024.    Allergies (verified) Zestril  [lisinopril ], Codeine, Cyanocobalamin  [vitamin b12], Hydrocodone , Penicillins, Morphine  sulfate, Norvasc  [amlodipine ], and Tylenol  [acetaminophen ]   History: Past Medical History:  Diagnosis Date   Anxiety    Back pain with radiation    Breast mass, right    Chronic pain    Chronic, continuous use of opioids    Complication of anesthesia 04/07/14   Allergic reaction to Lisinopril  immediately following surgery   DJD (degenerative joint disease)    Fibromyalgia    Groin abscess    Headache(784.0)    History of IBS    Hypercholesterolemia    IBS (irritable bowel syndrome)    Lactose intolerance    Mild hypertension    Obesity    Tobacco use disorder    Umbilical hernia    Symptomatic   Past Surgical History:  Procedure Laterality Date   ABDOMINAL HYSTERECTOMY     ANTERIOR CERVICAL DECOMP/DISCECTOMY FUSION N/A 12/20/2015   Procedure: ANTERIOR CERVICAL DECOMPRESSION FUSION CERVICAL 4-5, CERVICAL 5-6, CERVICAL 6-7  WITH INSTRUMENTATION AND ALLOGRAFT;  Surgeon: Oneil Priestly, MD;  Location: [facility] OR;  Service: Orthopedics;  Laterality: N/A;  Anterior cervical decompression fusion, cervical 4-5, cervical 5-6, cervical 6-7 with instrumentation and allograft   BACK SURGERY     BILATERAL SALPINGECTOMY  09/03/2012   Procedure: BILATERAL SALPINGECTOMY;  Surgeon: Curlee VEAR Guan, MD;  Location: [facility];  Service: Gynecology;  Laterality: Bilateral;   BREAST BIOPSY Right 04/07/2014   Procedure: REMOVAL RIGHT BREAST MASS WITH WIRE LOCALIZATION;  Surgeon: Krystal JINNY Russell, MD;  Location: [facility] OR;  Service: General;  Laterality: Right;   BREAST EXCISIONAL BIOPSY Left    x2   BREAST LUMPECTOMY     x2   CARPAL TUNNEL RELEASE Left 05/11/2019   Procedure: LEFT CARPAL  TUNNEL RELEASE, RIGHT TENNIS ELBOW MARCAINE /DEPO MEDROL  INJECTION UNDER ANESTHESIA;  Surgeon: Lucilla Lynwood BRAVO, MD;  Location: [facility] OR;  Service: Orthopedics;  Laterality: Left;   COLONOSCOPY W/ BIOPSIES  04/24/2012   per Dr. Avram, clear, repeat in 10 yrs    disectomy     ESOPHAGOGASTRODUODENOSCOPY     FINGER SURGERY     Right index-excision of mass    FOOT SURGERY Right    Bone Spurs   IR FLUORO GUIDE CV LINE RIGHT  01/29/2023   IR REMOVAL TUN CV CATH W/O FL  02/08/2023   IR US  GUIDE VASC ACCESS RIGHT  01/31/2023   LAPAROSCOPIC HYSTERECTOMY  09/03/2012   Procedure: HYSTERECTOMY TOTAL LAPAROSCOPIC;  Surgeon: Curlee VEAR Guan, MD;  Location: [facility];  Service: Gynecology;  Laterality: N/A;   LUMBAR DISC SURGERY     TUBAL LIGATION     UMBILICAL HERNIA REPAIR N/A 05/01/2018   Procedure: UMBILICAL HERNIA REPAIR;  Surgeon: Kimble Lynwood, MD;  Location: [facility] OR;  Service: General;  Laterality: N/A;   Family History  Problem Relation Age of Onset   Diabetes Mother    Diabetes Father    Breast cancer Maternal Aunt 46   Prostate cancer Paternal Grandfather    Social History   Socioeconomic History   Marital status: Married    Spouse name: Mahaley Schwering    Number of children: 4   Years of education: Not on file   Highest education level: Not on file  Occupational History   Occupation: Psychologist, educational  Tobacco Use   Smoking status: Former    Types: Cigarettes   Smokeless tobacco: Never   Tobacco comments:    quit March 2017  Vaping Use   Vaping status: Never Used  Substance and Sexual Activity   Alcohol use: Not Currently   Drug use: Not Currently    Comment: chronic use of dilaudid    Sexual activity: Not Currently    Birth control/protection: Surgical  Other Topics Concern   Not on file  Social History Narrative   Married - lives with husband 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, fashion/clothing    4 grown children does not live with her - 1985, 1988, 35 (twins)Grandchildren - 4Updated 10/12/2013   Right handed   Caffeine-none   Social Drivers of Health   Financial Resource Strain: Low Risk  (05/26/2024)   Overall Financial Resource Strain (CARDIA)    Difficulty of Paying Living Expenses: Not hard at all  Food Insecurity: No Food Insecurity (05/26/2024)   Hunger Vital Sign    Worried About Running Out of Food in the Last Year: Never true    Ran Out of Food in the Last Year: Never true  Transportation Needs: No Transportation Needs (05/26/2024)   PRAPARE - Administrator, Civil Service (Medical): No    Lack of Transportation (Non-Medical): No  Physical Activity: Insufficiently Active (05/26/2024)   Exercise Vital Sign    Days of Exercise per Week: 5 days    Minutes of Exercise per Session: 20 min  Stress: No Stress Concern Present (05/26/2024)   [employer] of Occupational Health - Occupational Stress Questionnaire    Feeling of Stress: Not at all  Social Connections: Socially Integrated (05/26/2024)   Social Connection and Isolation Panel    Frequency of Communication with Friends and Family: More than three times a week    Frequency of Social Gatherings with Friends and Family: More than three times a week    Attends  Religious Services: More  than 4 times per year    Active Member of Clubs or Organizations: Yes    Attends Club or Organization Meetings: More than 4 times per year    Marital Status: Married    Tobacco Counseling Counseling given: Not Answered Tobacco comments: quit March 2017    Clinical Intake:  Pre-visit preparation completed: Yes  Pain : No/denies pain     BMI - recorded: 51.49 Nutritional Status: BMI > 30  Obese Nutritional Risks: None Diabetes: No  Lab Results  Component Value Date   HGBA1C 6.1 09/05/2022     How often do you need to have someone help you when you read instructions, pamphlets, or other written materials from your doctor or pharmacy?: 1 - Never  Interpreter Needed?: No  Information entered by :: Rojelio Blush LPN   Activities of Daily Living     05/26/2024    2:17 PM  In your present state of health, do you have any difficulty performing the following activities:  Hearing? 0  Vision? 0  Difficulty concentrating or making decisions? 0  Walking or climbing stairs? 1  Comment Uses a Designer, television/film set or bathing? 0  Doing errands, shopping? Heritage manager and eating ? N  Using the Toilet? N  In the past six months, have you accidently leaked urine? N  Do you have problems with loss of bowel control? N  Managing your Medications? Y  Comment Husband assist  Managing your Finances? Y  Comment Husband assist  Housekeeping or managing your Housekeeping? Y  Comment Husband assist    Patient Care Team: Johnny Garnette LABOR, MD as PCP - General (Family Medicine)  I have updated your Care Teams any recent Medical Services you may have received from other providers in the past year.     Assessment:   This is a routine wellness examination for Dana [name].  Hearing/Vision screen Hearing Screening - Comments:: Denies hearing difficulties   Vision Screening - Comments:: Wears rx glasses - up to date with routine eye exams with   [facility]   Goals Addressed               This Visit's Progress     Increase physical activity (pt-stated)        Stay active.       Depression Screen     05/26/2024    2:16 PM 05/08/2023    4:53 PM 12/30/2022   11:39 AM 12/05/2022   11:48 AM 09/05/2022   12:09 PM 05/06/2022    2:52 PM 05/01/2022    3:51 PM  PHQ 2/9 Scores  PHQ - 2 Score 0 0 1 0 0 0 3  PHQ- 9 Score  0 6 5 3  8     Fall Risk     05/26/2024    2:19 PM 05/08/2023    4:55 PM 12/30/2022   11:39 AM 12/05/2022   11:47 AM 09/05/2022   12:09 PM  Fall Risk   Falls in the past year? 0 0 0 Exclusion - non ambulatory 0  Number falls in past yr: 0 0 0  0  Injury with Fall? 0 0 0  0  Risk for fall due to : No Fall Risks No Fall Risks   No Fall Risks  Follow up Falls evaluation completed Falls evaluation completed   Falls evaluation completed      Data saved with a previous flowsheet row definition    MEDICARE RISK AT HOME:  Medicare Risk at Home Any stairs in or around the home?: No If so, are there any without handrails?: No Home free of loose throw rugs in walkways, pet beds, electrical cords, etc?: Yes Adequate lighting in your home to reduce risk of falls?: Yes Life alert?: No Use of a cane, walker or w/c?: Yes Grab bars in the bathroom?: Yes Shower chair or bench in shower?: Yes Elevated toilet seat or a handicapped toilet?: No  TIMED UP AND GO:  Was the test performed?  No  Cognitive Function: 6CIT completed        05/26/2024    2:19 PM 05/06/2022    2:53 PM  6CIT Screen  What Year? 0 points 0 points  What month? 0 points 0 points  What time? 0 points 0 points  Count back from 20 0 points 2 points  Months in reverse 0 points 0 points  Repeat phrase 0 points 2 points  Total Score 0 points 4 points    Immunizations Immunization History  Administered Date(s) Administered   Influenza Split 07/16/2012, 06/12/2013   PFIZER(Purple Top)SARS-COV-2 Vaccination 12/08/2019, 12/29/2019,  09/26/2020   Pneumococcal Polysaccharide-23 09/04/2012    Screening Tests Health Maintenance  Topic Date Due   Hepatitis B Vaccines 19-59 Average Risk (1 of 3 - 19+ 3-dose series) Never done   Zoster Vaccines- Shingrix (1 of 2) Never done   Cervical Cancer Screening (HPV/Pap Cotest)  04/04/2014   Pneumococcal Vaccine: 50+ Years (2 of 2 - PCV) 09/08/2015   MAMMOGRAM  06/23/2020   Influenza Vaccine  04/16/2024   COVID-19 Vaccine (4 - 2025-26 season) 05/17/2024   Colonoscopy  09/15/2024 (Originally 04/24/2022)   Medicare Annual Wellness (AWV)  05/26/2025   Hepatitis C Screening  Completed   HIV Screening  Completed   HPV VACCINES  Aged Out   Meningococcal B Vaccine  Aged Out   DTaP/Tdap/Td  Discontinued    Health Maintenance Items Addressed: Mammogram deferred  Additional Screening:  Vision Screening: Recommended annual ophthalmology exams for early detection of glaucoma and other disorders of the eye. Is the patient up to date with their annual eye exam?  Yes  Who is the provider or what is the name of the office in which the patient attends annual eye exams? [facility]  Dental Screening: Recommended annual dental exams for proper oral hygiene  Community Resource Referral / Chronic Care Management: CRR required this visit?  No   CCM required this visit?  No   Plan:    I have personally reviewed and noted the following in the patient's chart:   Medical and social history Use of alcohol, tobacco or illicit drugs  Current medications and supplements including opioid prescriptions. Patient is currently taking opioid prescriptions. Information provided to patient regarding non-opioid alternatives. Patient advised to discuss non-opioid treatment plan with their provider. Functional ability and status Nutritional status Physical activity Advanced directives List of other physicians Hospitalizations, surgeries, and ER visits in previous 12 months Vitals Screenings to  include cognitive, depression, and falls Referrals and appointments  In addition, I have reviewed and discussed with patient certain preventive protocols, quality metrics, and best practice recommendations. A written personalized care plan for preventive services as well as general preventive health recommendations were provided to patient.   Rojelio LELON Blush, LPN   [id number]   After Visit Summary: (MyChart) Due to this being a telephonic visit, the after visit summary with patients personalized plan was offered to patient via MyChart   Notes: Nothing  significant to report at this time.

## 2024-05-26 NOTE — Therapy (Signed)
 OUTPATIENT PHYSICAL THERAPY NEURO TREATMENT  Patient Name: Dana [name] MRN: [id number] DOB:10-02-1965, 58 y.o., female Today's Date: 05/27/2024   PCP: Dana [name] REFERRING PROVIDER: Garnette [name]  END OF SESSION:  PT End of Session - 05/27/24 1057     Visit Number 14    Date for PT Re-Evaluation 08/12/24    Authorization Type [employer]    PT Start Time 1100    PT Stop Time 1145    PT Time Calculation (min) 45 min                      Past Medical History:  Diagnosis Date   Anxiety    Back pain with radiation    Breast mass, right    Chronic pain    Chronic, continuous use of opioids    Complication of anesthesia 04/07/14   Allergic reaction to Lisinopril  immediately following surgery   DJD (degenerative joint disease)    Fibromyalgia    Groin abscess    Headache(784.0)    History of IBS    Hypercholesterolemia    IBS (irritable bowel syndrome)    Lactose intolerance    Mild hypertension    Obesity    Tobacco use disorder    Umbilical hernia    Symptomatic   Past Surgical History:  Procedure Laterality Date   ABDOMINAL HYSTERECTOMY     ANTERIOR CERVICAL DECOMP/DISCECTOMY FUSION N/A 12/20/2015   Procedure: ANTERIOR CERVICAL DECOMPRESSION FUSION CERVICAL 4-5, CERVICAL 5-6, CERVICAL 6-7 WITH INSTRUMENTATION AND ALLOGRAFT;  Surgeon: Dana Priestly, MD;  Location: [facility] OR;  Service: Orthopedics;  Laterality: N/A;  Anterior cervical decompression fusion, cervical 4-5, cervical 5-6, cervical 6-7 with instrumentation and allograft   BACK SURGERY     BILATERAL SALPINGECTOMY  09/03/2012   Procedure: BILATERAL SALPINGECTOMY;  Surgeon: Dana VEAR Guan, MD;  Location: [facility];  Service: Gynecology;  Laterality: Bilateral;   BREAST BIOPSY Right 04/07/2014   Procedure: REMOVAL RIGHT BREAST MASS WITH WIRE LOCALIZATION;  Surgeon: Dana JINNY Russell, MD;  Location: [facility] OR;  Service: General;  Laterality: Right;   BREAST EXCISIONAL BIOPSY Left    x2   BREAST LUMPECTOMY     x2    CARPAL TUNNEL RELEASE Left 05/11/2019   Procedure: LEFT CARPAL TUNNEL RELEASE, RIGHT TENNIS ELBOW MARCAINE /DEPO MEDROL  INJECTION UNDER ANESTHESIA;  Surgeon: Dana [name] BRAVO, MD;  Location: [facility] OR;  Service: Orthopedics;  Laterality: Left;   COLONOSCOPY W/ BIOPSIES  04/24/2012   per Dr. Avram, clear, repeat in 10 yrs    disectomy     ESOPHAGOGASTRODUODENOSCOPY     FINGER SURGERY     Right index-excision of mass    FOOT SURGERY Right    Bone Spurs   IR FLUORO GUIDE CV LINE RIGHT  01/29/2023   IR REMOVAL TUN CV CATH W/O FL  02/08/2023   IR US  GUIDE VASC ACCESS RIGHT  01/31/2023   LAPAROSCOPIC HYSTERECTOMY  09/03/2012   Procedure: HYSTERECTOMY TOTAL LAPAROSCOPIC;  Surgeon: Dana VEAR Guan, MD;  Location: [facility];  Service: Gynecology;  Laterality: N/A;   LUMBAR DISC SURGERY     TUBAL LIGATION     UMBILICAL HERNIA REPAIR N/A 05/01/2018   Procedure: UMBILICAL HERNIA REPAIR;  Surgeon: Dana Lynwood, MD;  Location: [facility] OR;  Service: General;  Laterality: N/A;   Patient Active Problem List   Diagnosis Date Noted   Edema 11/20/2023   Hyperkeratosis of sole 08/01/2023   CIDP (chronic inflammatory demyelinating polyneuropathy) (HCC) 01/28/2023   Gait abnormality  01/28/2023   Glaucoma 01/28/2023   Pain due to onychomycosis of toenails of both feet 01/21/2023   Wheelchair dependence 12/30/2022   Neuropathic pain 12/30/2022   Insomnia due to medical condition 12/30/2022   Acute inflammatory demyelinating polyneuropathy (HCC) 10/01/2022   UTI (urinary tract infection) 09/24/2022   Hypomagnesemia 09/24/2022   Weakness 09/23/2022   Hypokalemia 09/23/2022   B12 deficiency 09/05/2022   Folate deficiency 09/05/2022   Carpal tunnel syndrome, left upper limb 05/11/2019    Class: Chronic   Spinal stenosis of lumbar region with neurogenic claudication 08/20/2018   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   Primary osteoarthritis of right knee 02/27/2017   Right tennis elbow 11/11/2016   Muscle cramps  01/15/2016   Bilateral leg edema 01/08/2016   Radiculopathy 12/20/2015   Hyperglycemia 12/21/2014   Mastodynia, female 11/30/2014   S/P excision of fibroadenoma of breast 11/30/2014   Angioedema of lips 04/07/2014   Fibroadenoma of right breast 03/07/2014   Pelvic pain in female 06/19/2012   Menorrhagia 06/11/2012   SUI (stress urinary incontinence, female) 06/11/2012   HTN (hypertension) 06/11/2012   IBS (irritable bowel syndrome) - diarrhea predominant 03/17/2012   MICROSCOPIC HEMATURIA 11/30/2010   BREAST PAIN, RIGHT 11/30/2010   BREAST MASS, RIGHT 01/12/2010   HYPERCHOLESTEROLEMIA 12/07/2007   CIGARETTE SMOKER 12/07/2007   Osteoarthritis 12/07/2007   Low back pain with sciatica 12/07/2007   Headache 12/07/2007   Obesity, Class III, BMI 40-49.9 (morbid obesity) 08/03/2007   Anxiety state 08/03/2007    ONSET DATE: 12/05/22  REFERRING DIAG:  G89.4 (ICD-10-CM) - Chronic pain syndrome    THERAPY DIAG:  Guillain Barr syndrome (HCC)  CIDP (chronic inflammatory demyelinating polyneuropathy) (HCC)  Difficulty in walking, not elsewhere classified  Muscle weakness (generalized)  Unsteadiness on feet  Other lack of coordination  Other abnormalities of gait and mobility  Rationale for Evaluation and Treatment: Rehabilitation  SUBJECTIVE:                                                                                                                                                                                             SUBJECTIVE STATEMENT: I feel weaker today, I usually do after my infusions. Last one was on Tuesday.    PERTINENT HISTORY: Pt Continuation for PT due to chronic inflammatory demyelinating polyneuropathy   01/28/23 ASSESSMENT AND PLAN   Dana [name] is a 58 y.o. female   Demyelinating polyradiculoneuropathy             Symptom onset monophasic since September 2024, failed to respond to IVIG in January 2024,  EMG nerve conduction study  showed demyelinating features, no evidence of axonal loss,             Talk with neurohospitalist Dr. Jerrie, ED triage, will send her for hospital admission for plasma exchange, please add on following labs, immunofixative protein electrophoresis, ANA with reflex, SSA, SSB titer, iron panel             Starting outpatient IVIG prior authorization process,   BRANDII LAKEY is a 58 y.o. female who presented to the [facility] ED on 09/23/2022 with bilateral lower extremity weakness with decreased mobility that has progressed to both arms. She also reported numbness and tingling of the extremities. She was transferred to [facility] for MRI. 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  Inpatient Rehab F/B HHPT.   R knee injection 10/08/22 trigger point injections for worsening back pain 2/15  RA  PAIN:  Are you having pain? No  PRECAUTIONS: None  RED FLAGS: None   WEIGHT BEARING RESTRICTIONS: No  FALLS: Has patient fallen in last 6 months? No  LIVING ENVIRONMENT: Lives with: lives with their family Lives in: House/apartment Stairs: No Has following equipment at home: Environmental consultant - 2 wheeled and Wheelchair (power)  PLOF: Independent and Independent with basic ADLs  PATIENT GOALS: I want to get out this chair and put it down in the basement   OBJECTIVE:  Note: Objective measures were completed at Evaluation unless otherwise noted.  DIAGNOSTIC FINDINGS: Right knee radiographs 11/09/2020   FINDINGS: Severe patellofemoral joint space narrowing. Severe superior and mild inferior patellar degenerative osteophytosis. Moderate superior trochlear degenerative osteophytosis. No joint effusion. Severe medial compartment joint space narrowing with moderate peripheral medial and lateral compartment degenerative osteophytes.   COGNITION: Overall cognitive status: Within functional limits for tasks assessed   SENSATION: Light touch: still has some numbness in L thigh down into the knee,  toes are also numb a little bit   EDEMA: some BLE edema   POSTURE: rounded shoulders and forward head  LOWER EXTREMITY ROM:  grossly WFL for her means, able to move better than last time she was in PT    LOWER EXTREMITY MMT:    MMT Right Eval Left Eval  Hip flexion 4 4  Hip extension    Hip abduction 4 4  Hip adduction 4 4  Hip internal rotation    Hip external rotation    Knee flexion 4 4-  Knee extension 4- 4-  Ankle dorsiflexion    Ankle plantarflexion    Ankle inversion    Ankle eversion    (Blank rows = not tested)   TRANSFERS: Assistive device utilized: Environmental consultant - 2 wheeled and Wheelchair (power)  Sit to stand: Modified independence Stand to sit: Complete Independence Chair to chair: Modified independence  GAIT: Gait pattern: step through pattern, decreased stride length, wide BOS, poor foot clearance- Right, and poor foot clearance- Left Distance walked: in clinic distances Assistive device utilized: Environmental consultant - 2 wheeled Level of assistance: Modified independence Comments: she was able to take a few steps without the walker at eval  FUNCTIONAL TESTS:  5 times sit to stand: 13.14s from elevated mat table  Timed up and go (TUG): 29s with walker  TREATMENT DATE:  05/27/24 Standing rows and ext green 2x10 LAQ 3# 2x10 HS curls green 2x10 Ball squeeze 2x10 NuStep L5 x69min   05/20/24 RECERT NuStep [id number]  STS 2x10 Side steps in front of mat table 2 HHA Standing alt punches with 3#  4 taps Up and down 4 steps   05/18/24 Recheck goals again Walk from table to Nustep 1HHA Nustep L5 x62mins  Walk back from NuStep to table no assistance  Standing march with walker  STS 2x10 Chest press to OHP with yellow ball 2x10   04/22/24 Walking with walker ~184ft  NuStep L5x64mins  LAQ 5# 2x10 Green band HS curls 2x10 Shoulder ext and rows  green 2x10   04/13/24 Recheck goals  Side steps along mat 2HHA OHP 2# 2x10 Alternating punches 2# 2x10 NuStep L5 x14mins  Walking with walker 1 big lap   03/18/24 OHP yellow ball x10 Seated horizontal abd red 2x10 Seated box taps 3# 2x10, then laterally  Ball squeeze 2x10 Seated march  Transfer from chair to mat and back  03/09/24 Recheck goals and recert Walking with walker 1 lap 173ft x2 5# LAQ and marching STS 2x10 NuStep L5x54mins    02/12/24 TUG with walker  NuStep L5x56mins  Seated calf to toe raises 2x12 STS 2x10 Box taps 4  Walking 1 lap with walker ~178ft   02/05/24 Transfer from power chair <> mat  LAQ 2x10 HS curls green 2x10 Seated chest press with 2# WATE 2x10 Seated AAROM 2# WATE shoulder flexion 2x10 Ball squeeze 2x10      01/20/24 Recheck 5xSTS goal Fitter pushes 2x10 STS with chest press 2# 2x8 Walking ~37ft from table to NuStep w/o AD NuStep L5x48mins Taps to side of treadmill 20 reps alt  Walk from treadmill back to mat table without AD   01/08/24 Taking steps in parallel bars without UE use- forwards, backwards, side steps  Calf raises by steps 2x10  Box taps 6 holding on to railings  Walk across steps 2HRA but can alternate feet  NuStep L5x103mins    12/30/23 NuStep L5x20mins  Standing marching with RW Standing hip abd with RW 2x10 STS 2x10 Walking laps 131ft, 188ft    12/16/23- EVAL    PATIENT EDUCATION: Education details: POC, aquatic therapy  Person educated: Patient Education method: Explanation Education comprehension: verbalized understanding  HOME EXERCISE PROGRAM: [id number]   GOALS: Goals reviewed with patient? Yes  SHORT TERM GOALS: Target date: 01/20/24  Patient will be independent with initial HEP. Baseline:  Goal status: MET  2.  Patient will demonstrate improved functional LE strength as demonstrated by 5xSTS <12s. Baseline: 13.14s from elevated mat table  Goal status: 8.75s from elevated table MET  01/20/24   LONG TERM GOALS: Target date: 08/12/24  Patient will be independent with advanced/ongoing HEP to improve outcomes and carryover.  Baseline:  Goal status: INITIAL  2.  Patient will be able to ambulate 500' with LRAD with good safety to access community.  Baseline: can walk with RW Goal status: IN PROGRESS 03/09/24, ongoing 04/13/24, progressing 05/18/24  3.  Patient will demonstrate decreased fall risk by scoring < 20 sec on TUG. Baseline: 29s Goal status: ongoing 44s w/walker, 31s w/walker 03/09/24, 34s 04/13/24, 30s 05/18/24   4.  Patient will be able to walk 81ft or more with no AD Baseline: took 10 steps modI  Goal status: IN PROGRESS did 66ft 01/20/24, unable to do today 03/09/24, able to do 1HHA 04/13/24, MET 05/18/24  5. Patient will increase  standing tolerance to 30 mins or longer to complete household and kitchen chores  Baseline: before needing to sit  Goal status: ongoing 10 mins 03/09/24, about 10 mins 04/13/24, 15-20 mins 05/18/24   ASSESSMENT:  CLINICAL IMPRESSION: Patient returns with increased fatigue and decreased tolerance to exercise. She feels weaker today after her infusion 2 days ago. Tried to keep it lighter today, had some difficulty with standing interventions and needed more frequent rest breaks. Will be out next week for eye surgery.    EVAL Patient is a 58 y.o. 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 She is currently still getting around in her power chair for the most part. She reports using the walker in her home, using it to get to the bathroom and in the kitchen. States her standing tolerance is about . Patient was able to take a few steps today without an assistive device. She has made great improvements from the last time she was at our clinic. She expresses interest in doing aquatic therapy. We will set her up to do 1x land based  and 1x a week aquatic therapy to be able to increase her standing and walking tolerance with and without an AD to improve her functional independence.   OBJECTIVE IMPAIRMENTS: Abnormal gait, decreased activity tolerance, decreased balance, decreased coordination, decreased endurance, difficulty walking, decreased ROM, decreased strength, postural dysfunction, and obesity.   ACTIVITY LIMITATIONS: carrying, lifting, squatting, stairs, transfers, and locomotion level  PARTICIPATION LIMITATIONS: cleaning, laundry, driving, shopping, and community activity  PERSONAL FACTORS: Fitness, Past/current experiences, Time since onset of injury/illness/exacerbation, and 1-2 comorbidities: chronic pain, GBS are also affecting patient's functional outcome.   REHAB POTENTIAL: Good  CLINICAL DECISION MAKING: Stable/uncomplicated  EVALUATION COMPLEXITY: Low  PLAN:  PT FREQUENCY: 1-2x/week  PT DURATION: 10 weeks  PLANNED INTERVENTIONS: 97110-Therapeutic exercises, 97530- Therapeutic activity, [id number]- Neuromuscular re-education, 97535- Self Care, [id number]- Manual therapy, [phone]- Gait training, [phone]- Aquatic Therapy, Patient/Family education, Balance training, and Stair training  PLAN FOR NEXT SESSION: start with any functional tasks and working on gait and balance. Endurance training   [employer], PT 05/27/2024, 11:42 AM

## 2024-05-26 NOTE — Patient Instructions (Addendum)
 Dana [name],  Thank you for taking the time for your Medicare Wellness Visit. I appreciate your continued commitment to your health goals. Please review the care plan we discussed, and feel free to reach out if I can assist you further.  Medicare recommends these wellness visits once per year to help you and your care team stay ahead of potential health issues. These visits are designed to focus on prevention, allowing your provider to concentrate on managing your acute and chronic conditions during your regular appointments.  Please note that Annual Wellness Visits do not include a physical exam. Some assessments may be limited, especially if the visit was conducted virtually. If needed, we may recommend a separate in-person follow-up with your provider.  Ongoing Care Seeing your primary care provider every 3 to 6 months helps us  monitor your health and provide consistent, personalized care.   Referrals If a referral was made during today's visit and you haven't received any updates within two weeks, please contact the referred provider directly to check on the status.  Recommended Screenings:  Health Maintenance  Topic Date Due   Hepatitis B Vaccine (1 of 3 - 19+ 3-dose series) Never done   Zoster (Shingles) Vaccine (1 of 2) Never done   Pap with HPV screening  04/04/2014   Pneumococcal Vaccine for age over 71 (2 of 2 - PCV) 09/08/2015   Mammogram  06/23/2020   Flu Shot  04/16/2024   COVID-19 Vaccine (4 - 2025-26 season) 05/17/2024   Colon Cancer Screening  09/15/2024*   Medicare Annual Wellness Visit  05/26/2025   Hepatitis C Screening  Completed   HIV Screening  Completed   HPV Vaccine  Aged Out   Meningitis B Vaccine  Aged Out   DTaP/Tdap/Td vaccine  Discontinued  *Topic was postponed. The date shown is not the original due date.   Opioid Pain Medicine Management Opioids are powerful medicines that are used to treat moderate to severe pain. When used for short periods of time,  they can help you to: Sleep better. Do better in physical or occupational therapy. Feel better in the first few days after an injury. Recover from surgery. Opioids should be taken with the supervision of a trained health care provider. They should be taken for the shortest period of time possible. This is because opioids can be addictive, and the longer you take opioids, the greater your risk of addiction. This addiction can also be called opioid use disorder. What are the risks? Using opioid pain medicines for longer than 3 days increases your risk of side effects. Side effects include: Constipation. Nausea and vomiting. Breathing difficulties (respiratory depression). Drowsiness. Confusion. Opioid use disorder. Itching. Taking opioid pain medicine for a long period of time can affect your ability to do daily tasks. It also puts you at risk for: Motor vehicle crashes. Depression. Suicide. Heart attack. Overdose, which can be life-threatening. What is a pain treatment plan? A pain treatment plan is an agreement between you and your health care provider. Pain is unique to each person, and treatments vary depending on your condition. To manage your pain, you and your health care provider need to work together. To help you do this: Discuss the goals of your treatment, including how much pain you might expect to have and how you will manage the pain. Review the risks and benefits of taking opioid medicines. Remember that a good treatment plan uses more than one approach and minimizes the chance of side effects. Be honest about  the amount of medicines you take and about any drug or alcohol use. Get pain medicine prescriptions from only one health care provider. Pain can be managed with many types of alternative treatments. Ask your health care provider to refer you to one or more specialists who can help you manage pain through: Physical or occupational therapy. Counseling (cognitive  behavioral therapy). Good nutrition. Biofeedback. Massage. Meditation. Non-opioid medicine. Following a gentle exercise program. How to use opioid pain medicine Taking medicine Take your pain medicine exactly as told by your health care provider. Take it only when you need it. If your pain gets less severe, you may take less than your prescribed dose if your health care provider approves. If you are not having pain, do nottake pain medicine unless your health care provider tells you to take it. If your pain is severe, do nottry to treat it yourself by taking more pills than instructed on your prescription. Contact your health care provider for help. Write down the times when you take your pain medicine. It is easy to become confused while on pain medicine. Writing the time can help you avoid overdose. Take other over-the-counter or prescription medicines only as told by your health care provider. Keeping yourself and others safe  While you are taking opioid pain medicine: Do not drive, use machinery, or power tools. Do not sign legal documents. Do not drink alcohol. Do not take sleeping pills. Do not supervise children by yourself. Do not do activities that require climbing or being in high places. Do not go to a lake, river, ocean, spa, or swimming pool. Do not share your pain medicine with anyone. Keep pain medicine in a locked cabinet or in a secure area where pets and children cannot reach it. Stopping your use of opioids If you have been taking opioid medicine for more than a few weeks, you may need to slowly decrease (taper) how much you take until you stop completely. Tapering your use of opioids can decrease your risk of symptoms of withdrawal, such as: Pain and cramping in the abdomen. Nausea. Sweating. Sleepiness. Restlessness. Uncontrollable shaking (tremors). Cravings for the medicine. Do not attempt to taper your use of opioids on your own. Talk with your health care  provider about how to do this. Your health care provider may prescribe a step-down schedule based on how much medicine you are taking and how long you have been taking it. Getting rid of leftover pills Do not save any leftover pills. Get rid of leftover pills safely by: Taking the medicine to a prescription take-back program. This is usually offered by the county or law enforcement. Bringing them to a pharmacy that has a drug disposal container. Flushing them down the toilet. Check the label or package insert of your medicine to see whether this is safe to do. Throwing them out in the trash. Check the label or package insert of your medicine to see whether this is safe to do. If it is safe to throw it out, remove the medicine from the original container, put it into a sealable bag or container, and mix it with used coffee grounds, food scraps, dirt, or cat litter before putting it in the trash. Follow these instructions at home: Activity Do exercises as told by your health care provider. Avoid activities that make your pain worse. Return to your normal activities as told by your health care provider. Ask your health care provider what activities are safe for you. General instructions You may need  to take these actions to prevent or treat constipation: Drink enough fluid to keep your urine pale yellow. Take over-the-counter or prescription medicines. Eat foods that are high in fiber, such as beans, whole grains, and fresh fruits and vegetables. Limit foods that are high in fat and processed sugars, such as fried or sweet foods. Keep all follow-up visits. This is important. Where to find support If you have been taking opioids for a long time, you may benefit from receiving support for quitting from a local support group or counselor. Ask your health care provider for a referral to these resources in your area. Where to find more information Centers for Disease Control and Prevention (CDC):  FootballExhibition.com.br [place] Food and Drug Administration (FDA): PumpkinSearch.com.ee Get help right away if: You may have taken too much of an opioid (overdosed). Common symptoms of an overdose: Your breathing is slower or more shallow than normal. You have a very slow heartbeat (pulse). You have slurred speech. You have nausea and vomiting. Your pupils become very small. You have other potential symptoms: You are very confused. You faint or feel like you will faint. You have cold, clammy skin. You have blue lips or fingernails. You have thoughts of harming yourself or harming others. These symptoms may represent a serious problem that is an emergency. Do not wait to see if the symptoms will go away. Get medical help right away. Call your local emergency services (911 in the [place] Do not drive yourself to the hospital.  If you ever feel like you may hurt yourself or others, or have thoughts about taking your own life, get help right away. Go to your nearest emergency department or: Call your local emergency services (911 in the [place] Call the [facility] (951-598-3633 in the [place] Call a suicide crisis helpline, such as the National Suicide Prevention Lifeline at (575) 550-0051 or 988 in the [place] This is open 24 hours a day in the [place] If you're a Veteran: Call 988 and press 1. This is open 24 hours a day. Text the [employer] at (509) 655-7552. Summary Opioid medicines can help you manage moderate to severe pain for a short period of time. A pain treatment plan is an agreement between you and your health care provider. Discuss the goals of your treatment, including how much pain you might expect to have and how you will manage the pain. If you think that you or someone else may have taken too much of an opioid, get medical help right away. This information is not intended to replace advice given to you by your health care provider. Make sure you discuss any questions you have with  your health care provider. Document Revised: 06/09/2023 Document Reviewed: 12/13/2020 Elsevier Patient Education  2024 Elsevier Inc.    05/26/2024    2:19 PM  Advanced Directives  Does Patient Have a Medical Advance Directive? No  Would patient like information on creating a medical advance directive? No - Patient declined   Advance Care Planning is important because it: Ensures you receive medical care that aligns with your values, goals, and preferences. Provides guidance to your family and loved ones, reducing the emotional burden of decision-making during critical moments.  Vision: Annual vision screenings are recommended for early detection of glaucoma, cataracts, and diabetic retinopathy. These exams can also reveal signs of chronic conditions such as diabetes and high blood pressure.  Dental: Annual dental screenings help detect early signs of oral cancer, gum disease, and other conditions  linked to overall health, including heart disease and diabetes.  Please see the attached documents for additional preventive care recommendations.

## 2024-05-27 ENCOUNTER — Ambulatory Visit

## 2024-05-27 DIAGNOSIS — R2681 Unsteadiness on feet: Secondary | ICD-10-CM

## 2024-05-27 DIAGNOSIS — G610 Guillain-Barre syndrome: Secondary | ICD-10-CM

## 2024-05-27 DIAGNOSIS — R278 Other lack of coordination: Secondary | ICD-10-CM

## 2024-05-27 DIAGNOSIS — R2689 Other abnormalities of gait and mobility: Secondary | ICD-10-CM

## 2024-05-27 DIAGNOSIS — R262 Difficulty in walking, not elsewhere classified: Secondary | ICD-10-CM

## 2024-05-27 DIAGNOSIS — G6181 Chronic inflammatory demyelinating polyneuritis: Secondary | ICD-10-CM

## 2024-05-27 DIAGNOSIS — M6281 Muscle weakness (generalized): Secondary | ICD-10-CM

## 2024-06-03 ENCOUNTER — Ambulatory Visit (INDEPENDENT_AMBULATORY_CARE_PROVIDER_SITE_OTHER)

## 2024-06-03 VITALS — Ht 64.0 in | Wt 300.0 lb

## 2024-06-03 DIAGNOSIS — R609 Edema, unspecified: Secondary | ICD-10-CM

## 2024-06-03 DIAGNOSIS — M79674 Pain in right toe(s): Secondary | ICD-10-CM | POA: Diagnosis not present

## 2024-06-03 DIAGNOSIS — M79675 Pain in left toe(s): Secondary | ICD-10-CM | POA: Diagnosis not present

## 2024-06-03 DIAGNOSIS — G610 Guillain-Barre syndrome: Secondary | ICD-10-CM | POA: Diagnosis not present

## 2024-06-03 DIAGNOSIS — B351 Tinea unguium: Secondary | ICD-10-CM | POA: Diagnosis not present

## 2024-06-03 DIAGNOSIS — L600 Ingrowing nail: Secondary | ICD-10-CM

## 2024-06-03 NOTE — Progress Notes (Signed)
 Subjective:  Patient ID: Dana [name], female    DOB: 02/11/1966,  MRN: [id number]  Chief Complaint  Patient presents with   Nail Problem    Pain due to onychomycosis of toenails of both feet, patient is also here for left foot hallux bilateral border ingrown nail      58 y.o. female presents with the above complaint. She states that she is having significant pain to her left hallux bilateral nail borders due to ingrowing nature.  She denies any purulence, signs of infection to the toenail.  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   Review of Systems: Negative except as noted in the HPI. Denies N/V/F/Ch.  Past Medical History:  Diagnosis Date   Anxiety    Back pain with radiation    Breast mass, right    Chronic pain    Chronic, continuous use of opioids    Complication of anesthesia 04/07/14   Allergic reaction to Lisinopril  immediately following surgery   DJD (degenerative joint disease)    Fibromyalgia    Groin abscess    Headache(784.0)    History of IBS    Hypercholesterolemia    IBS (irritable bowel syndrome)    Lactose intolerance    Mild hypertension    Obesity    Tobacco use disorder    Umbilical hernia    Symptomatic    Current Outpatient Medications:    ALPRAZolam  (XANAX ) 0.5 MG tablet, Take 1 tablet (0.5 mg total) by mouth 2 (two) times daily as needed for anxiety., Disp: 180 tablet, Rfl: 1   ammonium lactate  (LAC-HYDRIN ) 12 % lotion, APPLY TOPICALLY IN THE MORNING AND AT BEDTIME, Disp: 400 mL, Rfl: 3   benzonatate  (TESSALON ) 200 MG capsule, TAKE 1 CAPSULE (200 MG TOTAL) BY MOUTH EVERY 6 (SIX) HOURS AS NEEDED FOR COUGH., Disp: 60 capsule, Rfl: 3   celecoxib  (CELEBREX ) 100 MG capsule, Take 1 capsule (100 mg total) by mouth 2 (two) times daily., Disp: 180 capsule, Rfl: 3   cephALEXin  (KEFLEX ) 500 MG capsule, Take 1  capsule (500 mg total) by mouth 4 (four) times daily., Disp: 40 capsule, Rfl: 0   Cyanocobalamin  (VITAMIN B-12 PO), Take 1 tablet by mouth daily., Disp: , Rfl:    cyclobenzaprine  (FLEXERIL ) 10 MG tablet, Take 1 tablet (10 mg total) by mouth 3 (three) times daily as needed for muscle spasms., Disp: 90 tablet, Rfl: 5   dorzolamide -timolol  (COSOPT ) 2-0.5 % ophthalmic solution, Place 1 drop into both eyes 2 (two) times daily., Disp: 10 mL, Rfl: 1   DULoxetine  (CYMBALTA ) 60 MG capsule, TAKE 1 CAPSULE BY MOUTH EVERY DAY, Disp: 90 capsule, Rfl: 4   famotidine  (PEPCID ) 20 MG tablet, Take 20 mg by mouth 2 (two) times daily., Disp: , Rfl:    fluticasone  (FLONASE ) 50 MCG/ACT nasal spray, SPRAY 2 SPRAYS INTO EACH NOSTRIL EVERY DAY, Disp: 16 mL, Rfl: 11   folic acid  (FOLVITE ) 1 MG tablet, TAKE 1 TABLET BY MOUTH EVERY DAY, Disp: 90 tablet, Rfl: 3   furosemide  (LASIX ) 40 MG tablet, TAKE 1 TABLET BY MOUTH TWICE A DAY, Disp: 180 tablet, Rfl: 3   gabapentin  (NEURONTIN ) 300 MG capsule, Take 3 capsules (900 mg total) by mouth 3 (three) times daily., Disp: 270 capsule, Rfl: 5   hydrocerin (EUCERIN) CREA, Apply 1 Application topically 2 (two) times daily., Disp: , Rfl: 0   ibuprofen  (ADVIL ) 200 MG  tablet, Take 400 mg by mouth 2 (two) times daily as needed for headache or moderate pain., Disp: , Rfl:    Immune Globulin , Human, (OCTAGAM IV), Inject 85 g into the vein every 21 ( twenty-one) days. 10% octagam, Disp: , Rfl:    latanoprost  (XALATAN ) 0.005 % ophthalmic solution, Place 1 drop into both eyes at bedtime., Disp: 2.5 mL, Rfl: 1   magnesium  oxide (MAG-OX) 400 MG tablet, Take 1 tablet (400 mg total) by mouth at bedtime. (Patient taking differently: Take 200 mg by mouth at bedtime.), Disp: 90 tablet, Rfl: 3   metoprolol  tartrate (LOPRESSOR ) 50 MG tablet, TAKE 1 TABLET BY MOUTH TWICE A DAY, Disp: 180 tablet, Rfl: 3   Multiple Vitamin (MULTIVITAMIN WITH MINERALS) TABS tablet, Take 1 tablet by mouth daily., Disp: 30  tablet, Rfl: 0   Oxycodone  HCl 20 MG TABS, Take 1 tablet (20 mg total) by mouth every 6 (six) hours as needed (pain)., Disp: 120 tablet, Rfl: 0   Oxycodone  HCl 20 MG TABS, Take 1 tablet (20 mg total) by mouth every 6 (six) hours as needed (pain)., Disp: 120 tablet, Rfl: 0   Oxycodone  HCl 20 MG TABS, Take 1 tablet (20 mg total) by mouth every 6 (six) hours as needed (pain)., Disp: 120 tablet, Rfl: 0   pantoprazole  (PROTONIX ) 40 MG tablet, TAKE 1 TABLET BY MOUTH EVERY DAY, Disp: 90 tablet, Rfl: 3   potassium chloride  (KLOR-CON ) 10 MEQ tablet, TAKE 1 TABLET BY MOUTH 2 TIMES DAILY., Disp: 180 tablet, Rfl: 2   promethazine  (PHENERGAN ) 25 MG tablet, TAKE 1 TABLET (25 MG TOTAL) BY MOUTH EVERY 4 HOURS AS NEEDED FOR NAUSEA AND VOMITING, Disp: 60 tablet, Rfl: 5   senna-docusate (SENOKOT-S) 8.6-50 MG tablet, Take 1 tablet by mouth 2 (two) times daily. (Patient taking differently: Take 1 tablet by mouth 2 (two) times daily as needed for moderate constipation.), Disp: , Rfl:    traZODone  (DESYREL ) 50 MG tablet, TAKE 1 TO 2 TABLETS BY MOUTH AT BEDTIME AS NEEDED FOR SLEEP, Disp: 180 tablet, Rfl: 1  Social History   Tobacco Use  Smoking Status Former   Types: Cigarettes  Smokeless Tobacco Never  Tobacco Comments   quit March 2017    Allergies  Allergen Reactions   Zestril  [Lisinopril ] Anaphylaxis   Codeine Hives   Cyanocobalamin  [Vitamin B12] Itching    Patient able to take B12 injections with Benadryl .  Has no issues when taking B12 tablets.    Hydrocodone  Hives   Penicillins Itching and Swelling   Morphine  Sulfate Swelling   Norvasc  [Amlodipine ] Swelling   Tylenol  [Acetaminophen ] Rash   Objective:  There were no vitals filed for this visit. Body mass index is 51.49 kg/m. Constitutional Well developed. Well nourished.  Vascular Dorsalis pedis pulses faintly palpable bilaterally. Posterior tibial pulses non palpable bilaterally, likely secondary to edema. Capillary refill normal to all  digits. Digits 1-5 b/l warm to touch.  No cyanosis or clubbing noted. Pedal hair growth normal.  Neurologic Normal speech. Oriented to person, place, and time. Epicritic sensation to light touch grossly present bilaterally.  Dermatologic Painful ingrowing nail at bilateral nail borders of the hallux nail left. Nails 1-5 b/l are thickened, crumbly in texture, and dystrophic. Subungual fungal material present.  No other open wounds. No skin lesions.  Orthopedic: Normal joint ROM without pain or crepitus bilaterally. No visible deformities. No bony tenderness.   Radiographs: None Assessment:   1. Ingrown toenail of left foot   2. Pain due to  onychomycosis of toenails of both feet   3. Edema, unspecified type   4. Guillain Barr syndrome [facility])    Plan:  Patient was evaluated and treated and all questions answered.  Painful onychomycosis, nails 1-5 b/l - Discussed with patient diagnosis of painful onychomycosis to bilateral nails 1 through 5.  She has Guillain-Barr syndrome, is wheelchair-bound and is unable to take care of her own toenails. - Debridement of nails 1 through 5 bilateral performed today using nail nippers without incident, patient expressed satisfaction  Ingrown Nail, left hallux -Discussed treatment options ranging from surgical to conservative along with risks and benefits of each. Patient expresses understanding. Patient elects to proceed with minor surgery to remove ingrown toenail removal today. Consent reviewed and signed by patient. -Ingrown nail excised. See procedure note. -Educated on post-procedure care including soaking. Written instructions provided and reviewed. -Patient to follow up in 2 weeks for nail check.  Procedure: Excision of Ingrown Toenail Location: Left 1st toe bilateral nail borders. Anesthesia: Lidocaine  1% plain; 1.5 mL and Marcaine  0.5% plain; 1.5 mL, digital block. Skin Prep: Betadine . Dressing: Silvadene; telfa; dry, sterile,  compression dressing. Technique: Following skin prep, the toe was exsanguinated and a tourniquet was secured at the base of the toe. The affected nail border was freed, split with a nail splitter, and excised. Chemical matrixectomy was then performed with phenol and irrigated out with alcohol. The tourniquet was then removed and sterile dressing applied. Disposition: Patient tolerated procedure well. Patient to return in 2 weeks for follow-up.  Post op care: Keep dressing clean, dry, and intact for 24 hours before removing. Soak operative foot and redress BID as printed instructions describe. Patient educated on signs of infection, pt expresses understanding and will proceed for prompt medical care if signs of infection arise.   RTC 2 weeks for nail check  Prentice Ovens, DPM AACFAS Fellowship Trained Podiatric Surgeon [facility]

## 2024-06-03 NOTE — Patient Instructions (Signed)

## 2024-06-15 ENCOUNTER — Encounter: Payer: Self-pay | Admitting: Family Medicine

## 2024-06-15 ENCOUNTER — Telehealth (INDEPENDENT_AMBULATORY_CARE_PROVIDER_SITE_OTHER): Admitting: Family Medicine

## 2024-06-15 DIAGNOSIS — G8929 Other chronic pain: Secondary | ICD-10-CM

## 2024-06-15 DIAGNOSIS — M5440 Lumbago with sciatica, unspecified side: Secondary | ICD-10-CM | POA: Diagnosis not present

## 2024-06-15 MED ORDER — OXYCODONE HCL 20 MG PO TABS: 20.0000 mg | ORAL_TABLET | Freq: Four times a day (QID) | ORAL | 0 refills | Status: DC | PRN

## 2024-06-15 NOTE — Progress Notes (Signed)
 Subjective:    Patient ID: Dana [name], female    DOB: [birth year], 58 y.o.   MRN: [id number]  HPI Virtual Visit via Video Note  I connected with the patient on 06/15/24 at  8:30 AM EDT by a video enabled telemedicine application and verified that I am speaking with the correct person using two identifiers.  Location patient: home Location provider:work or home office Persons participating in the virtual visit: patient, provider  I discussed the limitations of evaluation and management by telemedicine and the availability of in person appointments. The patient expressed understanding and agreed to proceed.   HPI: Here for pain management. She is doing well.    ROS: See pertinent positives and negatives per HPI.  Past Medical History:  Diagnosis Date   Anxiety    Back pain with radiation    Breast mass, right    Chronic pain    Chronic, continuous use of opioids    Complication of anesthesia 04/07/14   Allergic reaction to Lisinopril  immediately following surgery   DJD (degenerative joint disease)    Fibromyalgia    Groin abscess    Headache(784.0)    History of IBS    Hypercholesterolemia    IBS (irritable bowel syndrome)    Lactose intolerance    Mild hypertension    Obesity    Tobacco use disorder    Umbilical hernia    Symptomatic    Past Surgical History:  Procedure Laterality Date   ABDOMINAL HYSTERECTOMY     ANTERIOR CERVICAL DECOMP/DISCECTOMY FUSION N/A 12/20/2015   Procedure: ANTERIOR CERVICAL DECOMPRESSION FUSION CERVICAL 4-5, CERVICAL 5-6, CERVICAL 6-7 WITH INSTRUMENTATION AND ALLOGRAFT;  Surgeon: Oneil Priestly, MD;  Location: [facility] OR;  Service: Orthopedics;  Laterality: N/A;  Anterior cervical decompression fusion, cervical 4-5, cervical 5-6, cervical 6-7 with instrumentation and allograft   BACK SURGERY     BILATERAL SALPINGECTOMY  09/03/2012   Procedure: BILATERAL SALPINGECTOMY;  Surgeon: Curlee VEAR Guan, MD;  Location: [facility] OR[facility];  Service: Gynecology;   Laterality: Bilateral;   BREAST BIOPSY Right 04/07/2014   Procedure: REMOVAL RIGHT BREAST MASS WITH WIRE LOCALIZATION;  Surgeon: Krystal JINNY Russell, MD;  Location: [facility] OR;  Service: General;  Laterality: Right;   BREAST EXCISIONAL BIOPSY Left    x2   BREAST LUMPECTOMY     x2   CARPAL TUNNEL RELEASE Left 05/11/2019   Procedure: LEFT CARPAL TUNNEL RELEASE, RIGHT TENNIS ELBOW MARCAINE /DEPO MEDROL  INJECTION UNDER ANESTHESIA;  Surgeon: Lucilla Lynwood BRAVO, MD;  Location: [facility] OR;  Service: Orthopedics;  Laterality: Left;   COLONOSCOPY W/ BIOPSIES  04/24/2012   per Dr. Avram, clear, repeat in 10 yrs    disectomy     ESOPHAGOGASTRODUODENOSCOPY     FINGER SURGERY     Right index-excision of mass    FOOT SURGERY Right    Bone Spurs   IR FLUORO GUIDE CV LINE RIGHT  01/29/2023   IR REMOVAL TUN CV CATH W/O FL  02/08/2023   IR US  GUIDE VASC ACCESS RIGHT  01/31/2023   LAPAROSCOPIC HYSTERECTOMY  09/03/2012   Procedure: HYSTERECTOMY TOTAL LAPAROSCOPIC;  Surgeon: Curlee VEAR Guan, MD;  Location: [facility] OR[facility];  Service: Gynecology;  Laterality: N/A;   LUMBAR DISC SURGERY     TUBAL LIGATION     UMBILICAL HERNIA REPAIR N/A 05/01/2018   Procedure: UMBILICAL HERNIA REPAIR;  Surgeon: Kimble Lynwood, MD;  Location: [facility] OR;  Service: General;  Laterality: N/A;    Family History  Problem Relation Age of Onset  Diabetes Mother    Diabetes Father    Breast cancer Maternal Aunt 46   Prostate cancer Paternal Grandfather      Current Outpatient Medications:    ALPRAZolam  (XANAX ) 0.5 MG tablet, Take 1 tablet (0.5 mg total) by mouth 2 (two) times daily as needed for anxiety., Disp: 180 tablet, Rfl: 1   ammonium lactate  (LAC-HYDRIN ) 12 % lotion, APPLY TOPICALLY IN THE MORNING AND AT BEDTIME, Disp: 400 mL, Rfl: 3   benzonatate  (TESSALON ) 200 MG capsule, TAKE 1 CAPSULE (200 MG TOTAL) BY MOUTH EVERY 6 (SIX) HOURS AS NEEDED FOR COUGH., Disp: 60 capsule, Rfl: 3   celecoxib  (CELEBREX ) 100 MG capsule, Take 1 capsule (100 mg total) by  mouth 2 (two) times daily., Disp: 180 capsule, Rfl: 3   cephALEXin  (KEFLEX ) 500 MG capsule, Take 1 capsule (500 mg total) by mouth 4 (four) times daily., Disp: 40 capsule, Rfl: 0   Cyanocobalamin  (VITAMIN B-12 PO), Take 1 tablet by mouth daily., Disp: , Rfl:    cyclobenzaprine  (FLEXERIL ) 10 MG tablet, Take 1 tablet (10 mg total) by mouth 3 (three) times daily as needed for muscle spasms., Disp: 90 tablet, Rfl: 5   dorzolamide -timolol  (COSOPT ) 2-0.5 % ophthalmic solution, Place 1 drop into both eyes 2 (two) times daily., Disp: 10 mL, Rfl: 1   DULoxetine  (CYMBALTA ) 60 MG capsule, TAKE 1 CAPSULE BY MOUTH EVERY DAY, Disp: 90 capsule, Rfl: 4   famotidine  (PEPCID ) 20 MG tablet, Take 20 mg by mouth 2 (two) times daily., Disp: , Rfl:    fluticasone  (FLONASE ) 50 MCG/ACT nasal spray, SPRAY 2 SPRAYS INTO EACH NOSTRIL EVERY DAY, Disp: 16 mL, Rfl: 11   folic acid  (FOLVITE ) 1 MG tablet, TAKE 1 TABLET BY MOUTH EVERY DAY, Disp: 90 tablet, Rfl: 3   furosemide  (LASIX ) 40 MG tablet, TAKE 1 TABLET BY MOUTH TWICE A DAY, Disp: 180 tablet, Rfl: 3   gabapentin  (NEURONTIN ) 300 MG capsule, Take 3 capsules (900 mg total) by mouth 3 (three) times daily., Disp: 270 capsule, Rfl: 5   hydrocerin (EUCERIN) CREA, Apply 1 Application topically 2 (two) times daily., Disp: , Rfl: 0   ibuprofen  (ADVIL ) 200 MG tablet, Take 400 mg by mouth 2 (two) times daily as needed for headache or moderate pain., Disp: , Rfl:    Immune Globulin , Human, (OCTAGAM IV), Inject 85 g into the vein every 21 ( twenty-one) days. 10% octagam, Disp: , Rfl:    latanoprost  (XALATAN ) 0.005 % ophthalmic solution, Place 1 drop into both eyes at bedtime., Disp: 2.5 mL, Rfl: 1   magnesium  oxide (MAG-OX) 400 MG tablet, Take 1 tablet (400 mg total) by mouth at bedtime. (Patient taking differently: Take 200 mg by mouth at bedtime.), Disp: 90 tablet, Rfl: 3   metoprolol  tartrate (LOPRESSOR ) 50 MG tablet, TAKE 1 TABLET BY MOUTH TWICE A DAY, Disp: 180 tablet, Rfl: 3    Multiple Vitamin (MULTIVITAMIN WITH MINERALS) TABS tablet, Take 1 tablet by mouth daily., Disp: 30 tablet, Rfl: 0   Oxycodone  HCl 20 MG TABS, Take 1 tablet (20 mg total) by mouth every 6 (six) hours as needed (pain)., Disp: 120 tablet, Rfl: 0   Oxycodone  HCl 20 MG TABS, Take 1 tablet (20 mg total) by mouth every 6 (six) hours as needed (pain)., Disp: 120 tablet, Rfl: 0   Oxycodone  HCl 20 MG TABS, Take 1 tablet (20 mg total) by mouth every 6 (six) hours as needed (pain)., Disp: 120 tablet, Rfl: 0   pantoprazole  (PROTONIX ) 40 MG tablet,  TAKE 1 TABLET BY MOUTH EVERY DAY, Disp: 90 tablet, Rfl: 3   potassium chloride  (KLOR-CON ) 10 MEQ tablet, TAKE 1 TABLET BY MOUTH 2 TIMES DAILY., Disp: 180 tablet, Rfl: 2   promethazine  (PHENERGAN ) 25 MG tablet, TAKE 1 TABLET (25 MG TOTAL) BY MOUTH EVERY 4 HOURS AS NEEDED FOR NAUSEA AND VOMITING, Disp: 60 tablet, Rfl: 5   senna-docusate (SENOKOT-S) 8.6-50 MG tablet, Take 1 tablet by mouth 2 (two) times daily. (Patient taking differently: Take 1 tablet by mouth 2 (two) times daily as needed for moderate constipation.), Disp: , Rfl:    traZODone  (DESYREL ) 50 MG tablet, TAKE 1 TO 2 TABLETS BY MOUTH AT BEDTIME AS NEEDED FOR SLEEP, Disp: 180 tablet, Rfl: 1  EXAM:  VITALS per patient if applicable:  GENERAL: alert, oriented, appears well and in no acute distress  HEENT: atraumatic, conjunttiva clear, no obvious abnormalities on inspection of external nose and ears  NECK: normal movements of the head and neck  LUNGS: on inspection no signs of respiratory distress, breathing rate appears normal, no obvious gross SOB, gasping or wheezing  CV: no obvious cyanosis  MS: moves all visible extremities without noticeable abnormality  PSYCH/NEURO: pleasant and cooperative, no obvious depression or anxiety, speech and thought processing grossly intact  ASSESSMENT AND PLAN: Pain management. Indication for chronic opioid: low back pain Medication and dose: Oxycodone  20 mg #  pills per month: 120 Last UDS date: 12-26-23 Opioid Treatment Agreement signed (Y/N): 12-29-17 Opioid Treatment Agreement last reviewed with patient:  05-15-24 [facility] reviewed this encounter (include red flags): Yes Meds were refilled.  Garnette Olmsted, MD  Discussed the following assessment and plan:  No diagnosis found.     I discussed the assessment and treatment plan with the patient. The patient was provided an opportunity to ask questions and all were answered. The patient agreed with the plan and demonstrated an understanding of the instructions.   The patient was advised to call back or seek an in-person evaluation if the symptoms worsen or if the condition fails to improve as anticipated.      Review of Systems     Objective:   Physical Exam        Assessment & Plan:

## 2024-06-17 ENCOUNTER — Telehealth: Payer: Self-pay | Admitting: *Deleted

## 2024-06-17 NOTE — Telephone Encounter (Signed)
 Pt order was faxed to [facility]

## 2024-06-17 NOTE — Telephone Encounter (Signed)
The RX is ready to fax

## 2024-06-17 NOTE — Telephone Encounter (Signed)
 Copied from CRM #[id number]. Topic: Clinical - Order For Equipment >> Jun 17, 2024 11:20 AM Larissa RAMAN wrote: Reason for CRM: Att: Inocente- Patient states that she is needing a prescription sent to [facility] for a gel mattress topper. She states this is urgent and needs to be done today.  Fax # [phone]

## 2024-06-21 ENCOUNTER — Telehealth: Payer: Self-pay | Admitting: *Deleted

## 2024-06-21 NOTE — Telephone Encounter (Signed)
 Copied from CRM [id number]. Topic: Clinical - Medication Question >> Jun 21, 2024  8:25 AM Revonda D wrote: Reason for CRM: Pt is requesting to speak with nurse Inocente in regards to a medication that was recently prescribed by Dr.Fry. Pt didn't go into detail on the medication and stated that she urgently needed to speak with nurse Inocente. Pt would like a callback today.

## 2024-06-21 NOTE — Telephone Encounter (Signed)
 Pt order for a Gel topper was refax to [facility] successfully

## 2024-06-21 NOTE — Therapy (Signed)
 OUTPATIENT PHYSICAL THERAPY NEURO TREATMENT  Patient Name: Dana [name] MRN: [id number] DOB:04/03/1966, 58 y.o., female Today's Date: 06/22/2024   PCP: Dana [name] REFERRING PROVIDER: Garnette [name]  END OF SESSION:  PT End of Session - 06/22/24 1228     Visit Number 15    Date for Recertification  08/12/24    Authorization Type [employer]    PT Start Time 1230    PT Stop Time 1315    PT Time Calculation (min) 45 min                       Past Medical History:  Diagnosis Date   Anxiety    Back pain with radiation    Breast mass, right    Chronic pain    Chronic, continuous use of opioids    Complication of anesthesia 04/07/14   Allergic reaction to Lisinopril  immediately following surgery   DJD (degenerative joint disease)    Fibromyalgia    Groin abscess    Headache(784.0)    History of IBS    Hypercholesterolemia    IBS (irritable bowel syndrome)    Lactose intolerance    Mild hypertension    Obesity    Tobacco use disorder    Umbilical hernia    Symptomatic   Past Surgical History:  Procedure Laterality Date   ABDOMINAL HYSTERECTOMY     ANTERIOR CERVICAL DECOMP/DISCECTOMY FUSION N/A 12/20/2015   Procedure: ANTERIOR CERVICAL DECOMPRESSION FUSION CERVICAL 4-5, CERVICAL 5-6, CERVICAL 6-7 WITH INSTRUMENTATION AND ALLOGRAFT;  Surgeon: Dana Priestly, MD;  Location: [facility] OR;  Service: Orthopedics;  Laterality: N/A;  Anterior cervical decompression fusion, cervical 4-5, cervical 5-6, cervical 6-7 with instrumentation and allograft   BACK SURGERY     BILATERAL SALPINGECTOMY  09/03/2012   Procedure: BILATERAL SALPINGECTOMY;  Surgeon: Dana VEAR Guan, MD;  Location: [facility];  Service: Gynecology;  Laterality: Bilateral;   BREAST BIOPSY Right 04/07/2014   Procedure: REMOVAL RIGHT BREAST MASS WITH WIRE LOCALIZATION;  Surgeon: Dana JINNY Russell, MD;  Location: [facility] OR;  Service: General;  Laterality: Right;   BREAST EXCISIONAL BIOPSY Left    x2   BREAST LUMPECTOMY      x2   CARPAL TUNNEL RELEASE Left 05/11/2019   Procedure: LEFT CARPAL TUNNEL RELEASE, RIGHT TENNIS ELBOW MARCAINE /DEPO MEDROL  INJECTION UNDER ANESTHESIA;  Surgeon: Dana [name] BRAVO, MD;  Location: [facility] OR;  Service: Orthopedics;  Laterality: Left;   COLONOSCOPY W/ BIOPSIES  04/24/2012   per Dr. Avram, clear, repeat in 10 yrs    disectomy     ESOPHAGOGASTRODUODENOSCOPY     FINGER SURGERY     Right index-excision of mass    FOOT SURGERY Right    Bone Spurs   IR FLUORO GUIDE CV LINE RIGHT  01/29/2023   IR REMOVAL TUN CV CATH W/O FL  02/08/2023   IR US  GUIDE VASC ACCESS RIGHT  01/31/2023   LAPAROSCOPIC HYSTERECTOMY  09/03/2012   Procedure: HYSTERECTOMY TOTAL LAPAROSCOPIC;  Surgeon: Dana VEAR Guan, MD;  Location: [facility];  Service: Gynecology;  Laterality: N/A;   LUMBAR DISC SURGERY     TUBAL LIGATION     UMBILICAL HERNIA REPAIR N/A 05/01/2018   Procedure: UMBILICAL HERNIA REPAIR;  Surgeon: Dana Lynwood, MD;  Location: [facility] OR;  Service: General;  Laterality: N/A;   Patient Active Problem List   Diagnosis Date Noted   Edema 11/20/2023   Hyperkeratosis of sole 08/01/2023   CIDP (chronic inflammatory demyelinating polyneuropathy) (HCC) 01/28/2023   Gait  abnormality 01/28/2023   Glaucoma 01/28/2023   Pain due to onychomycosis of toenails of both feet 01/21/2023   Wheelchair dependence 12/30/2022   Neuropathic pain 12/30/2022   Insomnia due to medical condition 12/30/2022   Acute inflammatory demyelinating polyneuropathy 10/01/2022   UTI (urinary tract infection) 09/24/2022   Hypomagnesemia 09/24/2022   Weakness 09/23/2022   Hypokalemia 09/23/2022   B12 deficiency 09/05/2022   Folate deficiency 09/05/2022   Carpal tunnel syndrome, left upper limb 05/11/2019    Class: Chronic   Spinal stenosis of lumbar region with neurogenic claudication 08/20/2018   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   Primary osteoarthritis of right knee 02/27/2017   Right tennis elbow 11/11/2016   Muscle cramps  01/15/2016   Bilateral leg edema 01/08/2016   Radiculopathy 12/20/2015   Hyperglycemia 12/21/2014   Mastodynia, female 11/30/2014   S/P excision of fibroadenoma of breast 11/30/2014   Angioedema of lips 04/07/2014   Fibroadenoma of right breast 03/07/2014   Pelvic pain in female 06/19/2012   Menorrhagia 06/11/2012   SUI (stress urinary incontinence, female) 06/11/2012   HTN (hypertension) 06/11/2012   IBS (irritable bowel syndrome) - diarrhea predominant 03/17/2012   MICROSCOPIC HEMATURIA 11/30/2010   BREAST PAIN, RIGHT 11/30/2010   BREAST MASS, RIGHT 01/12/2010   HYPERCHOLESTEROLEMIA 12/07/2007   CIGARETTE SMOKER 12/07/2007   Osteoarthritis 12/07/2007   Low back pain with sciatica 12/07/2007   Headache 12/07/2007   Obesity, Class III, BMI 40-49.9 (morbid obesity) (HCC) 08/03/2007   Anxiety state 08/03/2007    ONSET DATE: 12/05/22  REFERRING DIAG:  G89.4 (ICD-10-CM) - Chronic pain syndrome    THERAPY DIAG:  Guillain Barr syndrome  CIDP (chronic inflammatory demyelinating polyneuropathy) (HCC)  Difficulty in walking, not elsewhere classified  Muscle weakness (generalized)  Unsteadiness on feet  Other lack of coordination  Other abnormalities of gait and mobility  Rationale for Evaluation and Treatment: Rehabilitation  SUBJECTIVE:                                                                                                                                                                                             SUBJECTIVE STATEMENT: I had my eye surgeries and I just have not been good. I was able to walk a few feet in the house into the kitchen without the walker. I also had surgery for an ingrown toenail and it still hurts.   PERTINENT HISTORY: Pt Continuation for PT due to chronic inflammatory demyelinating polyneuropathy   01/28/23 ASSESSMENT AND PLAN   [name] is a 58 y.o. female   Demyelinating polyradiculoneuropathy  Symptom onset  monophasic since September 2024, failed to respond to IVIG in January 2024,             EMG nerve conduction study showed demyelinating features, no evidence of axonal loss,             Talk with neurohospitalist Dr. Jerrie, ED triage, will send her for hospital admission for plasma exchange, please add on following labs, immunofixative protein electrophoresis, ANA with reflex, SSA, SSB titer, iron panel             Starting outpatient IVIG prior authorization process,   TASHE PURDON is a 58 y.o. female who presented to the [facility] ED on 09/23/2022 with bilateral lower extremity weakness with decreased mobility that has progressed to both arms. She also reported numbness and tingling of the extremities. She was transferred to [facility] for MRI. 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  Inpatient Rehab F/B HHPT.   R knee injection 10/08/22 trigger point injections for worsening back pain 2/15  RA  PAIN:  Are you having pain? No  PRECAUTIONS: None  RED FLAGS: None   WEIGHT BEARING RESTRICTIONS: No  FALLS: Has patient fallen in last 6 months? No  LIVING ENVIRONMENT: Lives with: lives with their family Lives in: House/apartment Stairs: No Has following equipment at home: Environmental consultant - 2 wheeled and Wheelchair (power)  PLOF: Independent and Independent with basic ADLs  PATIENT GOALS: I want to get out this chair and put it down in the basement   OBJECTIVE:  Note: Objective measures were completed at Evaluation unless otherwise noted.  DIAGNOSTIC FINDINGS: Right knee radiographs 11/09/2020   FINDINGS: Severe patellofemoral joint space narrowing. Severe superior and mild inferior patellar degenerative osteophytosis. Moderate superior trochlear degenerative osteophytosis. No joint effusion. Severe medial compartment joint space narrowing with moderate peripheral medial and lateral compartment degenerative osteophytes.   COGNITION: Overall cognitive status: Within  functional limits for tasks assessed   SENSATION: Light touch: still has some numbness in L thigh down into the knee, toes are also numb a little bit   EDEMA: some BLE edema   POSTURE: rounded shoulders and forward head  LOWER EXTREMITY ROM:  grossly WFL for her means, able to move better than last time she was in PT    LOWER EXTREMITY MMT:    MMT Right Eval Left Eval  Hip flexion 4 4  Hip extension    Hip abduction 4 4  Hip adduction 4 4  Hip internal rotation    Hip external rotation    Knee flexion 4 4-  Knee extension 4- 4-  Ankle dorsiflexion    Ankle plantarflexion    Ankle inversion    Ankle eversion    (Blank rows = not tested)   TRANSFERS: Assistive device utilized: Environmental consultant - 2 wheeled and Wheelchair (power)  Sit to stand: Modified independence Stand to sit: Complete Independence Chair to chair: Modified independence  GAIT: Gait pattern: step through pattern, decreased stride length, wide BOS, poor foot clearance- Right, and poor foot clearance- Left Distance walked: in clinic distances Assistive device utilized: Environmental consultant - 2 wheeled Level of assistance: Modified independence Comments: she was able to take a few steps without the walker at eval  FUNCTIONAL TESTS:  5 times sit to stand: 13.14s from elevated mat table  Timed up and go (TUG): 29s with walker  TREATMENT DATE:  06/22/24 TUG 22s with RW Walking laps 219ft STS 2x10 Walking to NuStep without walker  NuStep L5x46mins stopped due to pain in toe  4# chest press 2x10 2# OHP 2x10  05/27/24 Standing rows and ext green 2x10 LAQ 3# 2x10 HS curls green 2x10 Ball squeeze 2x10 NuStep L5 x19min   05/20/24 RECERT NuStep [id number]  STS 2x10 Side steps in front of mat table 2 HHA Standing alt punches with 3#  4 taps Up and down 4 steps   05/18/24 Recheck goals again Walk  from table to Nustep 1HHA Nustep L5 x65mins  Walk back from NuStep to table no assistance  Standing march with walker  STS 2x10 Chest press to OHP with yellow ball 2x10   04/22/24 Walking with walker ~139ft  NuStep L5x63mins  LAQ 5# 2x10 Green band HS curls 2x10 Shoulder ext and rows green 2x10   04/13/24 Recheck goals  Side steps along mat 2HHA OHP 2# 2x10 Alternating punches 2# 2x10 NuStep L5 x82mins  Walking with walker 1 big lap   03/18/24 OHP yellow ball x10 Seated horizontal abd red 2x10 Seated box taps 3# 2x10, then laterally  Ball squeeze 2x10 Seated march  Transfer from chair to mat and back  03/09/24 Recheck goals and recert Walking with walker 1 lap 176ft x2 5# LAQ and marching STS 2x10 NuStep L5x11mins    02/12/24 TUG with walker  NuStep L5x8mins  Seated calf to toe raises 2x12 STS 2x10 Box taps 4  Walking 1 lap with walker ~114ft   02/05/24 Transfer from power chair <> mat  LAQ 2x10 HS curls green 2x10 Seated chest press with 2# WATE 2x10 Seated AAROM 2# WATE shoulder flexion 2x10 Ball squeeze 2x10      01/20/24 Recheck 5xSTS goal Fitter pushes 2x10 STS with chest press 2# 2x8 Walking ~32ft from table to NuStep w/o AD NuStep L5x60mins Taps to side of treadmill 20 reps alt  Walk from treadmill back to mat table without AD   01/08/24 Taking steps in parallel bars without UE use- forwards, backwards, side steps  Calf raises by steps 2x10  Box taps 6 holding on to railings  Walk across steps 2HRA but can alternate feet  NuStep L5x86mins    12/30/23 NuStep L5x36mins  Standing marching with RW Standing hip abd with RW 2x10 STS 2x10 Walking laps 187ft, 160ft    12/16/23- EVAL    PATIENT EDUCATION: Education details: POC, aquatic therapy  Person educated: Patient Education method: Explanation Education comprehension: verbalized understanding  HOME EXERCISE PROGRAM: [id number]   GOALS: Goals reviewed with patient? Yes  SHORT TERM  GOALS: Target date: 01/20/24  Patient will be independent with initial HEP. Baseline:  Goal status: MET  2.  Patient will demonstrate improved functional LE strength as demonstrated by 5xSTS <12s. Baseline: 13.14s from elevated mat table  Goal status: 8.75s from elevated table MET 01/20/24   LONG TERM GOALS: Target date: 08/12/24  Patient will be independent with advanced/ongoing HEP to improve outcomes and carryover.  Baseline:  Goal status: INITIAL  2.  Patient will be able to ambulate 500' with LRAD with good safety to access community.  Baseline: can walk with RW Goal status: IN PROGRESS 03/09/24, ongoing 04/13/24, progressing 05/18/24, 200' 06/22/24  3.  Patient will demonstrate decreased fall risk by scoring < 20 sec on TUG. Baseline: 29s Goal status: ongoing 44s w/walker, 31s w/walker 03/09/24, 34s 04/13/24, 30s 05/18/24, 22s 06/22/24  4.  Patient will be able to walk  73ft or more with no AD Baseline: took 10 steps modI  Goal status: IN PROGRESS did 50ft 01/20/24, unable to do today 03/09/24, able to do 1HHA 04/13/24, MET 05/18/24  5. Patient will increase standing tolerance to 30 mins or longer to complete household and kitchen chores  Baseline: before needing to sit  Goal status: ongoing 10 mins 03/09/24, about 10 mins 04/13/24, 15-20 mins 05/18/24   ASSESSMENT:  CLINICAL IMPRESSION: Patient returns after a month away for two eye surgeries. She also had an ingrown toenail surgery. Today she was able to complete TUG the fastest she has and was able to walk 259ft. However, she was pretty fatigued and out of breath after the walk. She walked about 43ft to the NuStep. But she did have some pain in her big toe when walking and when on the NuStep.    EVAL Patient is a 58 y.o. 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 She is currently still getting around in her power chair  for the most part. She reports using the walker in her home, using it to get to the bathroom and in the kitchen. States her standing tolerance is about . Patient was able to take a few steps today without an assistive device. She has made great improvements from the last time she was at our clinic. She expresses interest in doing aquatic therapy. We will set her up to do 1x land based and 1x a week aquatic therapy to be able to increase her standing and walking tolerance with and without an AD to improve her functional independence.   OBJECTIVE IMPAIRMENTS: Abnormal gait, decreased activity tolerance, decreased balance, decreased coordination, decreased endurance, difficulty walking, decreased ROM, decreased strength, postural dysfunction, and obesity.   ACTIVITY LIMITATIONS: carrying, lifting, squatting, stairs, transfers, and locomotion level  PARTICIPATION LIMITATIONS: cleaning, laundry, driving, shopping, and community activity  PERSONAL FACTORS: Fitness, Past/current experiences, Time since onset of injury/illness/exacerbation, and 1-2 comorbidities: chronic pain, GBS are also affecting patient's functional outcome.   REHAB POTENTIAL: Good  CLINICAL DECISION MAKING: Stable/uncomplicated  EVALUATION COMPLEXITY: Low  PLAN:  PT FREQUENCY: 1-2x/week  PT DURATION: 10 weeks  PLANNED INTERVENTIONS: 97110-Therapeutic exercises, 97530- Therapeutic activity, [id number]- Neuromuscular re-education, 97535- Self Care, [id number]- Manual therapy, [phone]- Gait training, [phone]- Aquatic Therapy, Patient/Family education, Balance training, and Stair training  PLAN FOR NEXT SESSION: start with any functional tasks and working on gait and balance. Endurance training   [employer], PT 06/22/2024, 1:12 PM

## 2024-06-22 ENCOUNTER — Other Ambulatory Visit: Payer: Self-pay | Admitting: Physical Medicine and Rehabilitation

## 2024-06-22 ENCOUNTER — Ambulatory Visit: Attending: Family Medicine

## 2024-06-22 DIAGNOSIS — R2689 Other abnormalities of gait and mobility: Secondary | ICD-10-CM | POA: Diagnosis present

## 2024-06-22 DIAGNOSIS — M6281 Muscle weakness (generalized): Secondary | ICD-10-CM | POA: Diagnosis present

## 2024-06-22 DIAGNOSIS — R2681 Unsteadiness on feet: Secondary | ICD-10-CM | POA: Diagnosis present

## 2024-06-22 DIAGNOSIS — R278 Other lack of coordination: Secondary | ICD-10-CM | POA: Diagnosis present

## 2024-06-22 DIAGNOSIS — G610 Guillain-Barre syndrome: Secondary | ICD-10-CM | POA: Insufficient documentation

## 2024-06-22 DIAGNOSIS — G6181 Chronic inflammatory demyelinating polyneuritis: Secondary | ICD-10-CM | POA: Insufficient documentation

## 2024-06-22 DIAGNOSIS — R262 Difficulty in walking, not elsewhere classified: Secondary | ICD-10-CM | POA: Diagnosis present

## 2024-06-24 ENCOUNTER — Ambulatory Visit

## 2024-06-24 ENCOUNTER — Telehealth: Payer: Self-pay | Admitting: Family Medicine

## 2024-06-24 NOTE — Telephone Encounter (Signed)
 Copied from CRM [id number]. Topic: General - Other >> Jun 24, 2024  4:36 PM Jasmin G wrote: Reason for CRM: Pt requested to speak to Ms. Inocente, one of Dr. Mira nurses. Please call pt back at [phone].

## 2024-06-25 ENCOUNTER — Telehealth: Payer: Self-pay | Admitting: *Deleted

## 2024-06-25 ENCOUNTER — Other Ambulatory Visit: Payer: Self-pay

## 2024-06-25 DIAGNOSIS — G6181 Chronic inflammatory demyelinating polyneuritis: Secondary | ICD-10-CM

## 2024-06-25 DIAGNOSIS — Z993 Dependence on wheelchair: Secondary | ICD-10-CM

## 2024-06-25 DIAGNOSIS — G610 Guillain-Barre syndrome: Secondary | ICD-10-CM

## 2024-06-25 DIAGNOSIS — M792 Neuralgia and neuritis, unspecified: Secondary | ICD-10-CM

## 2024-06-25 NOTE — Telephone Encounter (Signed)
 Spoke with pt advised that both orders for gel mattress topper and the Hospital bed was faxed to [facility], pt voiced understanding

## 2024-06-25 NOTE — Telephone Encounter (Signed)
 Done and faxed to [facility]

## 2024-06-25 NOTE — Telephone Encounter (Signed)
 Copied from CRM [id number]. Topic: Clinical - Order For Equipment >> Jun 25, 2024  8:09 AM Suzen RAMAN wrote: Reason for CRM:  Patient called several times requesting to speak with Nurse Inocente regarding a replacement hospital bed, her current one is broken. Patient states she is disabled and has no alternate sleeping method. Patient would like prescription for DME sent to [facility].    Fax:[phone]

## 2024-06-28 ENCOUNTER — Telehealth: Payer: Self-pay

## 2024-06-28 NOTE — Telephone Encounter (Signed)
 Spoke with [facility] DME Agent stated that pt insurance denied coverage for the Hospital bed /mattress toper since this equipments were given to pt in 2024.Stated that if pt needs both equipments she will have to pay out of pocket. Patient notified and voiced understanding Copied from CRM [id number]. Topic: Clinical - Order For Equipment >> Jun 24, 2024  4:42 PM Wess RAMAN wrote: Reason for CRM: Patient is requesting to speak to Nurse Inocente in regards to getting a prescription for a hospital bed sent to [facility]. She stated her bed is broken  Callback #: [phone]

## 2024-06-29 ENCOUNTER — Ambulatory Visit

## 2024-06-29 DIAGNOSIS — R262 Difficulty in walking, not elsewhere classified: Secondary | ICD-10-CM

## 2024-06-29 DIAGNOSIS — M6281 Muscle weakness (generalized): Secondary | ICD-10-CM

## 2024-06-29 DIAGNOSIS — R278 Other lack of coordination: Secondary | ICD-10-CM

## 2024-06-29 DIAGNOSIS — R2681 Unsteadiness on feet: Secondary | ICD-10-CM

## 2024-06-29 DIAGNOSIS — G610 Guillain-Barre syndrome: Secondary | ICD-10-CM | POA: Diagnosis not present

## 2024-06-29 DIAGNOSIS — G6181 Chronic inflammatory demyelinating polyneuritis: Secondary | ICD-10-CM

## 2024-06-29 NOTE — Therapy (Signed)
 OUTPATIENT PHYSICAL THERAPY NEURO TREATMENT  Patient Name: Dana [name] MRN: [id number] DOB:1966/07/08, 58 y.o., female Today's Date: 06/29/2024   PCP: Dana [name] REFERRING PROVIDER: Garnette [name]  END OF SESSION:  PT End of Session - 06/29/24 1224     Visit Number 16    Date for Recertification  08/12/24    Authorization Type [employer]    PT Start Time 1225    PT Stop Time 1305    PT Time Calculation (min) 40 min                        Past Medical History:  Diagnosis Date   Anxiety    Back pain with radiation    Breast mass, right    Chronic pain    Chronic, continuous use of opioids    Complication of anesthesia 04/07/14   Allergic reaction to Lisinopril  immediately following surgery   DJD (degenerative joint disease)    Fibromyalgia    Groin abscess    Headache(784.0)    History of IBS    Hypercholesterolemia    IBS (irritable bowel syndrome)    Lactose intolerance    Mild hypertension    Obesity    Tobacco use disorder    Umbilical hernia    Symptomatic   Past Surgical History:  Procedure Laterality Date   ABDOMINAL HYSTERECTOMY     ANTERIOR CERVICAL DECOMP/DISCECTOMY FUSION N/A 12/20/2015   Procedure: ANTERIOR CERVICAL DECOMPRESSION FUSION CERVICAL 4-5, CERVICAL 5-6, CERVICAL 6-7 WITH INSTRUMENTATION AND ALLOGRAFT;  Surgeon: Dana Priestly, MD;  Location: [facility] OR;  Service: Orthopedics;  Laterality: N/A;  Anterior cervical decompression fusion, cervical 4-5, cervical 5-6, cervical 6-7 with instrumentation and allograft   BACK SURGERY     BILATERAL SALPINGECTOMY  09/03/2012   Procedure: BILATERAL SALPINGECTOMY;  Surgeon: Dana VEAR Guan, MD;  Location: [facility];  Service: Gynecology;  Laterality: Bilateral;   BREAST BIOPSY Right 04/07/2014   Procedure: REMOVAL RIGHT BREAST MASS WITH WIRE LOCALIZATION;  Surgeon: Dana JINNY Russell, MD;  Location: [facility] OR;  Service: General;  Laterality: Right;   BREAST EXCISIONAL BIOPSY Left    x2   BREAST LUMPECTOMY      x2   CARPAL TUNNEL RELEASE Left 05/11/2019   Procedure: LEFT CARPAL TUNNEL RELEASE, RIGHT TENNIS ELBOW MARCAINE /DEPO MEDROL  INJECTION UNDER ANESTHESIA;  Surgeon: Dana [name] BRAVO, MD;  Location: [facility] OR;  Service: Orthopedics;  Laterality: Left;   COLONOSCOPY W/ BIOPSIES  04/24/2012   per Dr. Avram, clear, repeat in 10 yrs    disectomy     ESOPHAGOGASTRODUODENOSCOPY     FINGER SURGERY     Right index-excision of mass    FOOT SURGERY Right    Bone Spurs   IR FLUORO GUIDE CV LINE RIGHT  01/29/2023   IR REMOVAL TUN CV CATH W/O FL  02/08/2023   IR US  GUIDE VASC ACCESS RIGHT  01/31/2023   LAPAROSCOPIC HYSTERECTOMY  09/03/2012   Procedure: HYSTERECTOMY TOTAL LAPAROSCOPIC;  Surgeon: Dana VEAR Guan, MD;  Location: [facility];  Service: Gynecology;  Laterality: N/A;   LUMBAR DISC SURGERY     TUBAL LIGATION     UMBILICAL HERNIA REPAIR N/A 05/01/2018   Procedure: UMBILICAL HERNIA REPAIR;  Surgeon: Dana Lynwood, MD;  Location: [facility];  Service: General;  Laterality: N/A;   Patient Active Problem List   Diagnosis Date Noted   Edema 11/20/2023   Hyperkeratosis of sole 08/01/2023   CIDP (chronic inflammatory demyelinating polyneuropathy) (HCC) 01/28/2023  Gait abnormality 01/28/2023   Glaucoma 01/28/2023   Pain due to onychomycosis of toenails of both feet 01/21/2023   Wheelchair dependence 12/30/2022   Neuropathic pain 12/30/2022   Insomnia due to medical condition 12/30/2022   Acute inflammatory demyelinating polyneuropathy 10/01/2022   UTI (urinary tract infection) 09/24/2022   Hypomagnesemia 09/24/2022   Weakness 09/23/2022   Hypokalemia 09/23/2022   B12 deficiency 09/05/2022   Folate deficiency 09/05/2022   Carpal tunnel syndrome, left upper limb 05/11/2019    Class: Chronic   Spinal stenosis of lumbar region with neurogenic claudication 08/20/2018   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   Primary osteoarthritis of right knee 02/27/2017   Right tennis elbow 11/11/2016   Muscle cramps  01/15/2016   Bilateral leg edema 01/08/2016   Radiculopathy 12/20/2015   Hyperglycemia 12/21/2014   Mastodynia, female 11/30/2014   S/P excision of fibroadenoma of breast 11/30/2014   Angioedema of lips 04/07/2014   Fibroadenoma of right breast 03/07/2014   Pelvic pain in female 06/19/2012   Menorrhagia 06/11/2012   SUI (stress urinary incontinence, female) 06/11/2012   HTN (hypertension) 06/11/2012   IBS (irritable bowel syndrome) - diarrhea predominant 03/17/2012   MICROSCOPIC HEMATURIA 11/30/2010   BREAST PAIN, RIGHT 11/30/2010   BREAST MASS, RIGHT 01/12/2010   HYPERCHOLESTEROLEMIA 12/07/2007   CIGARETTE SMOKER 12/07/2007   Osteoarthritis 12/07/2007   Low back pain with sciatica 12/07/2007   Headache 12/07/2007   Obesity, Class III, BMI 40-49.9 (morbid obesity) (HCC) 08/03/2007   Anxiety state 08/03/2007    ONSET DATE: 12/05/22  REFERRING DIAG:  G89.4 (ICD-10-CM) - Chronic pain syndrome    THERAPY DIAG:  CIDP (chronic inflammatory demyelinating polyneuropathy) (HCC)  Other lack of coordination  Difficulty in walking, not elsewhere classified  Muscle weakness (generalized)  Unsteadiness on feet  Rationale for Evaluation and Treatment: Rehabilitation  SUBJECTIVE:                                                                                                                                                                                             SUBJECTIVE STATEMENT: I had my eye surgeries and I just have not been good. I was able to walk a few feet in the house into the kitchen without the walker. I also had surgery for an ingrown toenail and it still hurts.   PERTINENT HISTORY: Pt Continuation for PT due to chronic inflammatory demyelinating polyneuropathy   01/28/23 ASSESSMENT AND PLAN   Dana [name] is a 58 y.o. female   Demyelinating polyradiculoneuropathy             Symptom onset monophasic since September 2024, failed to  respond to IVIG in January  2024,             EMG nerve conduction study showed demyelinating features, no evidence of axonal loss,             Talk with neurohospitalist Dr. Jerrie, Dana [name], will send her for hospital admission for plasma exchange, please add on following labs, immunofixative protein electrophoresis, ANA with reflex, SSA, SSB titer, iron panel             Starting outpatient IVIG prior authorization process,   SHYNA DUIGNAN is a 58 y.o. female who presented to the [facility] on 09/23/2022 with bilateral lower extremity weakness with decreased mobility that has progressed to both arms. She also reported numbness and tingling of the extremities. She was transferred to [facility] for MRI. 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  Inpatient Rehab F/B HHPT.   R knee injection 10/08/22 trigger point injections for worsening back pain 2/15  RA  PAIN:  Are you having pain? No  PRECAUTIONS: None  RED FLAGS: None   WEIGHT BEARING RESTRICTIONS: No  FALLS: Has patient fallen in last 6 months? No  LIVING ENVIRONMENT: Lives with: lives with their family Lives in: House/apartment Stairs: No Has following equipment at home: Environmental consultant - 2 wheeled and Wheelchair (power)  PLOF: Independent and Independent with basic ADLs  PATIENT GOALS: I want to get out this chair and put it down in the basement   OBJECTIVE:  Note: Objective measures were completed at Evaluation unless otherwise noted.  DIAGNOSTIC FINDINGS: Right knee radiographs 11/09/2020   FINDINGS: Severe patellofemoral joint space narrowing. Severe superior and mild inferior patellar degenerative osteophytosis. Moderate superior trochlear degenerative osteophytosis. No joint effusion. Severe medial compartment joint space narrowing with moderate peripheral medial and lateral compartment degenerative osteophytes.   COGNITION: Overall cognitive status: Within functional limits for tasks assessed   SENSATION: Light touch: still  has some numbness in L thigh down into the knee, toes are also numb a little bit   EDEMA: some BLE edema   POSTURE: rounded shoulders and forward head  LOWER EXTREMITY ROM:  grossly WFL for her means, able to move better than last time she was in PT    LOWER EXTREMITY MMT:    MMT Right Eval Left Eval  Hip flexion 4 4  Hip extension    Hip abduction 4 4  Hip adduction 4 4  Hip internal rotation    Hip external rotation    Knee flexion 4 4-  Knee extension 4- 4-  Ankle dorsiflexion    Ankle plantarflexion    Ankle inversion    Ankle eversion    (Blank rows = not tested)   TRANSFERS: Assistive device utilized: Environmental consultant - 2 wheeled and Wheelchair (power)  Sit to stand: Modified independence Stand to sit: Complete Independence Chair to chair: Modified independence  GAIT: Gait pattern: step through pattern, decreased stride length, wide BOS, poor foot clearance- Right, and poor foot clearance- Left Distance walked: in clinic distances Assistive device utilized: Environmental consultant - 2 wheeled Level of assistance: Modified independence Comments: she was able to take a few steps without the walker at eval  FUNCTIONAL TESTS:  5 times sit to stand: 13.14s from elevated mat table  Timed up and go (TUG): 29s with walker  TREATMENT DATE:  06/29/24 NuStep L5x  Standing rows and ext green 2x10 Walking with walker 229ft, 154ft, 171ft  Standing shoulder flexion with 3# WaTE x10   06/22/24 TUG 22s with RW Walking laps 26ft STS 2x10 Walking to NuStep without walker  NuStep L5x57mins stopped due to pain in toe  4# chest press 2x10 2# OHP 2x10  05/27/24 Standing rows and ext green 2x10 LAQ 3# 2x10 HS curls green 2x10 Ball squeeze 2x10 NuStep L5 x59min   05/20/24 RECERT NuStep [id number]  STS 2x10 Side steps in front of mat table 2 HHA Standing alt  punches with 3#  4 taps Up and down 4 steps   05/18/24 Recheck goals again Walk from table to Nustep 1HHA Nustep L5 x16mins  Walk back from NuStep to table no assistance  Standing march with walker  STS 2x10 Chest press to OHP with yellow ball 2x10   04/22/24 Walking with walker ~177ft  NuStep L5x71mins  LAQ 5# 2x10 Green band HS curls 2x10 Shoulder ext and rows green 2x10   04/13/24 Recheck goals  Side steps along mat 2HHA OHP 2# 2x10 Alternating punches 2# 2x10 NuStep L5 x45mins  Walking with walker 1 big lap   03/18/24 OHP yellow ball x10 Seated horizontal abd red 2x10 Seated box taps 3# 2x10, then laterally  Ball squeeze 2x10 Seated march  Transfer from chair to mat and back  03/09/24 Recheck goals and recert Walking with walker 1 lap 148ft x2 5# LAQ and marching STS 2x10 NuStep L5x51mins    02/12/24 TUG with walker  NuStep L5x68mins  Seated calf to toe raises 2x12 STS 2x10 Box taps 4  Walking 1 lap with walker ~162ft   02/05/24 Transfer from power chair <> mat  LAQ 2x10 HS curls green 2x10 Seated chest press with 2# WATE 2x10 Seated AAROM 2# WATE shoulder flexion 2x10 Ball squeeze 2x10      01/20/24 Recheck 5xSTS goal Fitter pushes 2x10 STS with chest press 2# 2x8 Walking ~47ft from table to NuStep w/o AD NuStep L5x24mins Taps to side of treadmill 20 reps alt  Walk from treadmill back to mat table without AD   01/08/24 Taking steps in parallel bars without UE use- forwards, backwards, side steps  Calf raises by steps 2x10  Box taps 6 holding on to railings  Walk across steps 2HRA but can alternate feet  NuStep L5x8mins    12/30/23 NuStep L5x56mins  Standing marching with RW Standing hip abd with RW 2x10 STS 2x10 Walking laps 144ft, 140ft    12/16/23- EVAL    PATIENT EDUCATION: Education details: POC, aquatic therapy  Person educated: Patient Education method: Explanation Education comprehension: verbalized understanding  HOME  EXERCISE PROGRAM: [id number]   GOALS: Goals reviewed with patient? Yes  SHORT TERM GOALS: Target date: 01/20/24  Patient will be independent with initial HEP. Baseline:  Goal status: MET  2.  Patient will demonstrate improved functional LE strength as demonstrated by 5xSTS <12s. Baseline: 13.14s from elevated mat table  Goal status: 8.75s from elevated table MET 01/20/24   LONG TERM GOALS: Target date: 08/12/24  Patient will be independent with advanced/ongoing HEP to improve outcomes and carryover.  Baseline:  Goal status: INITIAL  2.  Patient will be able to ambulate 500' with LRAD with good safety to access community.  Baseline: can walk with RW Goal status: IN PROGRESS 03/09/24, ongoing 04/13/24, progressing 05/18/24, 200' 06/22/24  3.  Patient will demonstrate decreased fall risk by scoring < 20 sec  on TUG. Baseline: 29s Goal status: ongoing 44s w/walker, 31s w/walker 03/09/24, 34s 04/13/24, 30s 05/18/24, 22s 06/22/24  4.  Patient will be able to walk 97ft or more with no AD Baseline: took 10 steps modI  Goal status: IN PROGRESS did 52ft 01/20/24, unable to do today 03/09/24, able to do 1HHA 04/13/24, MET 05/18/24  5. Patient will increase standing tolerance to 30 mins or longer to complete household and kitchen chores  Baseline: before needing to sit  Goal status: ongoing 10 mins 03/09/24, about 10 mins 04/13/24, 15-20 mins 05/18/24, no progress 06/29/24   ASSESSMENT:  CLINICAL IMPRESSION: Patient was unable to do the NuStep today for more than 2 mins due to cramp/spasm in her L abdominal. Because of the ongoing cramp she was limited some in session today. She also reports ongoing pain in the L toe. She is supposed to go Thursday for a follow up for this.    EVAL Patient is a 58 y.o. 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 She is currently still getting around in  her power chair for the most part. She reports using the walker in her home, using it to get to the bathroom and in the kitchen. States her standing tolerance is about . Patient was able to take a few steps today without an assistive device. She has made great improvements from the last time she was at our clinic. She expresses interest in doing aquatic therapy. We will set her up to do 1x land based and 1x a week aquatic therapy to be able to increase her standing and walking tolerance with and without an AD to improve her functional independence.   OBJECTIVE IMPAIRMENTS: Abnormal gait, decreased activity tolerance, decreased balance, decreased coordination, decreased endurance, difficulty walking, decreased ROM, decreased strength, postural dysfunction, and obesity.   ACTIVITY LIMITATIONS: carrying, lifting, squatting, stairs, transfers, and locomotion level  PARTICIPATION LIMITATIONS: cleaning, laundry, driving, shopping, and community activity  PERSONAL FACTORS: Fitness, Past/current experiences, Time since onset of injury/illness/exacerbation, and 1-2 comorbidities: chronic pain, GBS are also affecting patient's functional outcome.   REHAB POTENTIAL: Good  CLINICAL DECISION MAKING: Stable/uncomplicated  EVALUATION COMPLEXITY: Low  PLAN:  PT FREQUENCY: 1-2x/week  PT DURATION: 10 weeks  PLANNED INTERVENTIONS: 97110-Therapeutic exercises, 97530- Therapeutic activity, [id number]- Neuromuscular re-education, 97535- Self Care, [id number]- Manual therapy, [phone]- Gait training, [phone]- Aquatic Therapy, Patient/Family education, Balance training, and Stair training  PLAN FOR NEXT SESSION: start with any functional tasks and working on gait and balance. Endurance training   [employer], PT 06/29/2024, 1:05 PM

## 2024-06-30 NOTE — Therapy (Incomplete)
 OUTPATIENT PHYSICAL THERAPY NEURO TREATMENT  Patient Name: Dana [name] MRN: [id number] DOB:[birth year], 58 y.o., female Today's Date: 06/30/2024   PCP: Garnette Olmsted REFERRING PROVIDER: Garnette Olmsted  END OF SESSION:                  Past Medical History:  Diagnosis Date   Anxiety    Back pain with radiation    Breast mass, right    Chronic pain    Chronic, continuous use of opioids    Complication of anesthesia 04/07/14   Allergic reaction to Lisinopril  immediately following surgery   DJD (degenerative joint disease)    Fibromyalgia    Groin abscess    Headache(784.0)    History of IBS    Hypercholesterolemia    IBS (irritable bowel syndrome)    Lactose intolerance    Mild hypertension    Obesity    Tobacco use disorder    Umbilical hernia    Symptomatic   Past Surgical History:  Procedure Laterality Date   ABDOMINAL HYSTERECTOMY     ANTERIOR CERVICAL DECOMP/DISCECTOMY FUSION N/A 12/20/2015   Procedure: ANTERIOR CERVICAL DECOMPRESSION FUSION CERVICAL 4-5, CERVICAL 5-6, CERVICAL 6-7 WITH INSTRUMENTATION AND ALLOGRAFT;  Surgeon: Oneil Priestly, MD;  Location: [facility] OR;  Service: Orthopedics;  Laterality: N/A;  Anterior cervical decompression fusion, cervical 4-5, cervical 5-6, cervical 6-7 with instrumentation and allograft   BACK SURGERY     BILATERAL SALPINGECTOMY  09/03/2012   Procedure: BILATERAL SALPINGECTOMY;  Surgeon: Curlee VEAR Guan, MD;  Location: [facility] OR[facility];  Service: Gynecology;  Laterality: Bilateral;   BREAST BIOPSY Right 04/07/2014   Procedure: REMOVAL RIGHT BREAST MASS WITH WIRE LOCALIZATION;  Surgeon: Krystal JINNY Russell, MD;  Location: [facility] OR;  Service: General;  Laterality: Right;   BREAST EXCISIONAL BIOPSY Left    x2   BREAST LUMPECTOMY     x2   CARPAL TUNNEL RELEASE Left 05/11/2019   Procedure: LEFT CARPAL TUNNEL RELEASE, RIGHT TENNIS ELBOW MARCAINE /DEPO MEDROL  INJECTION UNDER ANESTHESIA;  Surgeon: Lucilla Lynwood BRAVO, MD;  Location: [facility] OR;  Service:  Orthopedics;  Laterality: Left;   COLONOSCOPY W/ BIOPSIES  04/24/2012   per Dr. Avram, clear, repeat in 10 yrs    disectomy     ESOPHAGOGASTRODUODENOSCOPY     FINGER SURGERY     Right index-excision of mass    FOOT SURGERY Right    Bone Spurs   IR FLUORO GUIDE CV LINE RIGHT  01/29/2023   IR REMOVAL TUN CV CATH W/O FL  02/08/2023   IR US  GUIDE VASC ACCESS RIGHT  01/31/2023   LAPAROSCOPIC HYSTERECTOMY  09/03/2012   Procedure: HYSTERECTOMY TOTAL LAPAROSCOPIC;  Surgeon: Curlee VEAR Guan, MD;  Location: [facility];  Service: Gynecology;  Laterality: N/A;   LUMBAR DISC SURGERY     TUBAL LIGATION     UMBILICAL HERNIA REPAIR N/A 05/01/2018   Procedure: UMBILICAL HERNIA REPAIR;  Surgeon: Kimble Lynwood, MD;  Location: [facility] OR;  Service: General;  Laterality: N/A;   Patient Active Problem List   Diagnosis Date Noted   Edema 11/20/2023   Hyperkeratosis of sole 08/01/2023   CIDP (chronic inflammatory demyelinating polyneuropathy) (HCC) 01/28/2023   Gait abnormality 01/28/2023   Glaucoma 01/28/2023   Pain due to onychomycosis of toenails of both feet 01/21/2023   Wheelchair dependence 12/30/2022   Neuropathic pain 12/30/2022   Insomnia due to medical condition 12/30/2022   Acute inflammatory demyelinating polyneuropathy 10/01/2022   UTI (urinary tract infection) 09/24/2022   Hypomagnesemia 09/24/2022   Weakness  09/23/2022   Hypokalemia 09/23/2022   B12 deficiency 09/05/2022   Folate deficiency 09/05/2022   Carpal tunnel syndrome, left upper limb 05/11/2019    Class: Chronic   Spinal stenosis of lumbar region with neurogenic claudication 08/20/2018   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   Primary osteoarthritis of right knee 02/27/2017   Right tennis elbow 11/11/2016   Muscle cramps 01/15/2016   Bilateral leg edema 01/08/2016   Radiculopathy 12/20/2015   Hyperglycemia 12/21/2014   Mastodynia, female 11/30/2014   S/P excision of fibroadenoma of breast 11/30/2014   Angioedema of lips  04/07/2014   Fibroadenoma of right breast 03/07/2014   Pelvic pain in female 06/19/2012   Menorrhagia 06/11/2012   SUI (stress urinary incontinence, female) 06/11/2012   HTN (hypertension) 06/11/2012   IBS (irritable bowel syndrome) - diarrhea predominant 03/17/2012   MICROSCOPIC HEMATURIA 11/30/2010   BREAST PAIN, RIGHT 11/30/2010   BREAST MASS, RIGHT 01/12/2010   HYPERCHOLESTEROLEMIA 12/07/2007   CIGARETTE SMOKER 12/07/2007   Osteoarthritis 12/07/2007   Low back pain with sciatica 12/07/2007   Headache 12/07/2007   Obesity, Class III, BMI 40-49.9 (morbid obesity) (HCC) 08/03/2007   Anxiety state 08/03/2007    ONSET DATE: 12/05/22  REFERRING DIAG:  G89.4 (ICD-10-CM) - Chronic pain syndrome    THERAPY DIAG:  No diagnosis found.  Rationale for Evaluation and Treatment: Rehabilitation  SUBJECTIVE:                                                                                                                                                                                             SUBJECTIVE STATEMENT: I had my eye surgeries and I just have not been good. I was able to walk a few feet in the house into the kitchen without the walker. I also had surgery for an ingrown toenail and it still hurts.   PERTINENT HISTORY: Pt Continuation for PT due to chronic inflammatory demyelinating polyneuropathy   01/28/23 ASSESSMENT AND PLAN   Dana [name] is a 58 y.o. female   Demyelinating polyradiculoneuropathy             Symptom onset monophasic since September 2024, failed to respond to IVIG in January 2024,             EMG nerve conduction study showed demyelinating features, no evidence of axonal loss,             Talk with neurohospitalist Dr. Jerrie, ED triage, will send her for hospital admission for plasma exchange, please add on following labs, immunofixative protein electrophoresis, ANA with reflex, SSA, SSB titer, iron panel  Starting outpatient IVIG prior  authorization process,   Dana [name] is a 58 y.o. female who presented to the [facility] ED on 09/23/2022 with bilateral lower extremity weakness with decreased mobility that has progressed to both arms. She also reported numbness and tingling of the extremities. She was transferred to [facility] for MRI. 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  Inpatient Rehab F/B HHPT.   R knee injection 10/08/22 trigger point injections for worsening back pain 2/15  RA  PAIN:  Are you having pain? No  PRECAUTIONS: None  RED FLAGS: None   WEIGHT BEARING RESTRICTIONS: No  FALLS: Has patient fallen in last 6 months? No  LIVING ENVIRONMENT: Lives with: lives with their family Lives in: House/apartment Stairs: No Has following equipment at home: Environmental consultant - 2 wheeled and Wheelchair (power)  PLOF: Independent and Independent with basic ADLs  PATIENT GOALS: I want to get out this chair and put it down in the basement   OBJECTIVE:  Note: Objective measures were completed at Evaluation unless otherwise noted.  DIAGNOSTIC FINDINGS: Right knee radiographs 11/09/2020   FINDINGS: Severe patellofemoral joint space narrowing. Severe superior and mild inferior patellar degenerative osteophytosis. Moderate superior trochlear degenerative osteophytosis. No joint effusion. Severe medial compartment joint space narrowing with moderate peripheral medial and lateral compartment degenerative osteophytes.   COGNITION: Overall cognitive status: Within functional limits for tasks assessed   SENSATION: Light touch: still has some numbness in L thigh down into the knee, toes are also numb a little bit   EDEMA: some BLE edema   POSTURE: rounded shoulders and forward head  LOWER EXTREMITY ROM:  grossly WFL for her means, able to move better than last time she was in PT    LOWER EXTREMITY MMT:    MMT Right Eval Left Eval  Hip flexion 4 4  Hip extension    Hip abduction 4 4  Hip  adduction 4 4  Hip internal rotation    Hip external rotation    Knee flexion 4 4-  Knee extension 4- 4-  Ankle dorsiflexion    Ankle plantarflexion    Ankle inversion    Ankle eversion    (Blank rows = not tested)   TRANSFERS: Assistive device utilized: Environmental consultant - 2 wheeled and Wheelchair (power)  Sit to stand: Modified independence Stand to sit: Complete Independence Chair to chair: Modified independence  GAIT: Gait pattern: step through pattern, decreased stride length, wide BOS, poor foot clearance- Right, and poor foot clearance- Left Distance walked: in clinic distances Assistive device utilized: Environmental consultant - 2 wheeled Level of assistance: Modified independence Comments: she was able to take a few steps without the walker at eval  FUNCTIONAL TESTS:  5 times sit to stand: 13.14s from elevated mat table  Timed up and go (TUG): 29s with walker  TREATMENT DATE:  07/01/24 NuStep or walking laps Taps to 6 with 3# ankle weights  Hip abd 3#  [employer]  STS with chest press  06/29/24 NuStep L5x  Standing rows and ext green 2x10 Walking with walker 230ft, 150ft, 166ft  Standing shoulder flexion with 3# WaTE x10   06/22/24 TUG 22s with RW Walking laps 258ft STS 2x10 Walking to NuStep without walker  NuStep L5x67mins stopped due to pain in toe  4# chest press 2x10 2# OHP 2x10  05/27/24 Standing rows and ext green 2x10 LAQ 3# 2x10 HS curls green 2x10 Ball squeeze 2x10 NuStep L5 x37min   05/20/24 RECERT NuStep [id number]  STS 2x10 Side steps in front of mat table 2 HHA Standing alt punches with 3#  4 taps Up and down 4 steps   05/18/24 Recheck goals again Walk from table to Nustep 1HHA Nustep L5 x73mins  Walk back from NuStep to table no assistance  Standing march with walker  STS 2x10 Chest press to OHP with yellow ball  2x10   04/22/24 Walking with walker ~143ft  NuStep L5x59mins  LAQ 5# 2x10 Green band HS curls 2x10 Shoulder ext and rows green 2x10   04/13/24 Recheck goals  Side steps along mat 2HHA OHP 2# 2x10 Alternating punches 2# 2x10 NuStep L5 x16mins  Walking with walker 1 big lap   03/18/24 OHP yellow ball x10 Seated horizontal abd red 2x10 Seated box taps 3# 2x10, then laterally  Ball squeeze 2x10 Seated march  Transfer from chair to mat and back  03/09/24 Recheck goals and recert Walking with walker 1 lap 148ft x2 5# LAQ and marching STS 2x10 NuStep L5x71mins    02/12/24 TUG with walker  NuStep L5x36mins  Seated calf to toe raises 2x12 STS 2x10 Box taps 4  Walking 1 lap with walker ~134ft   02/05/24 Transfer from power chair <> mat  LAQ 2x10 HS curls green 2x10 Seated chest press with 2# WATE 2x10 Seated AAROM 2# WATE shoulder flexion 2x10 Ball squeeze 2x10      01/20/24 Recheck 5xSTS goal Fitter pushes 2x10 STS with chest press 2# 2x8 Walking ~85ft from table to NuStep w/o AD NuStep L5x94mins Taps to side of treadmill 20 reps alt  Walk from treadmill back to mat table without AD   01/08/24 Taking steps in parallel bars without UE use- forwards, backwards, side steps  Calf raises by steps 2x10  Box taps 6 holding on to railings  Walk across steps 2HRA but can alternate feet  NuStep L5x39mins    12/30/23 NuStep L5x54mins  Standing marching with RW Standing hip abd with RW 2x10 STS 2x10 Walking laps 163ft, 122ft    12/16/23- EVAL    PATIENT EDUCATION: Education details: POC, aquatic therapy  Person educated: Patient Education method: Explanation Education comprehension: verbalized understanding  HOME EXERCISE PROGRAM: [id number]   GOALS: Goals reviewed with patient? Yes  SHORT TERM GOALS: Target date: 01/20/24  Patient will be independent with initial HEP. Baseline:  Goal status: MET  2.  Patient will demonstrate improved functional LE strength as  demonstrated by 5xSTS <12s. Baseline: 13.14s from elevated mat table  Goal status: 8.75s from elevated table MET 01/20/24   LONG TERM GOALS: Target date: 08/12/24  Patient will be independent with advanced/ongoing HEP to improve outcomes and carryover.  Baseline:  Goal status: INITIAL  2.  Patient will be able to ambulate 500' with LRAD with good safety to access community.  Baseline: can walk  with RW Goal status: IN PROGRESS 03/09/24, ongoing 04/13/24, progressing 05/18/24, 200' 06/22/24  3.  Patient will demonstrate decreased fall risk by scoring < 20 sec on TUG. Baseline: 29s Goal status: ongoing 44s w/walker, 31s w/walker 03/09/24, 34s 04/13/24, 30s 05/18/24, 22s 06/22/24  4.  Patient will be able to walk 76ft or more with no AD Baseline: took 10 steps modI  Goal status: IN PROGRESS did 53ft 01/20/24, unable to do today 03/09/24, able to do 1HHA 04/13/24, MET 05/18/24  5. Patient will increase standing tolerance to 30 mins or longer to complete household and kitchen chores  Baseline: before needing to sit  Goal status: ongoing 10 mins 03/09/24, about 10 mins 04/13/24, 15-20 mins 05/18/24, no progress 06/29/24   ASSESSMENT:  CLINICAL IMPRESSION: Patient was unable to do the NuStep today for more than 2 mins due to cramp/spasm in her L abdominal. Because of the ongoing cramp she was limited some in session today. She also reports ongoing pain in the L toe. She is supposed to go Thursday for a follow up for this.    EVAL Patient is a 58 y.o. 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 She is currently still getting around in her power chair for the most part. She reports using the walker in her home, using it to get to the bathroom and in the kitchen. States her standing tolerance is about . Patient was able to take a few steps today without an assistive device. She has made  great improvements from the last time she was at our clinic. She expresses interest in doing aquatic therapy. We will set her up to do 1x land based and 1x a week aquatic therapy to be able to increase her standing and walking tolerance with and without an AD to improve her functional independence.   OBJECTIVE IMPAIRMENTS: Abnormal gait, decreased activity tolerance, decreased balance, decreased coordination, decreased endurance, difficulty walking, decreased ROM, decreased strength, postural dysfunction, and obesity.   ACTIVITY LIMITATIONS: carrying, lifting, squatting, stairs, transfers, and locomotion level  PARTICIPATION LIMITATIONS: cleaning, laundry, driving, shopping, and community activity  PERSONAL FACTORS: Fitness, Past/current experiences, Time since onset of injury/illness/exacerbation, and 1-2 comorbidities: chronic pain, GBS are also affecting patient's functional outcome.   REHAB POTENTIAL: Good  CLINICAL DECISION MAKING: Stable/uncomplicated  EVALUATION COMPLEXITY: Low  PLAN:  PT FREQUENCY: 1-2x/week  PT DURATION: 10 weeks  PLANNED INTERVENTIONS: 97110-Therapeutic exercises, 97530- Therapeutic activity, [id number]- Neuromuscular re-education, 97535- Self Care, [id number]- Manual therapy, [phone]- Gait training, [phone]- Aquatic Therapy, Patient/Family education, Balance training, and Stair training  PLAN FOR NEXT SESSION: start with any functional tasks and working on gait and balance. Endurance training   [employer], PT 06/30/2024, 4:50 PM

## 2024-07-01 ENCOUNTER — Ambulatory Visit

## 2024-07-13 ENCOUNTER — Ambulatory Visit

## 2024-07-15 ENCOUNTER — Ambulatory Visit

## 2024-07-17 ENCOUNTER — Other Ambulatory Visit: Payer: Self-pay | Admitting: Physical Medicine and Rehabilitation

## 2024-07-17 ENCOUNTER — Other Ambulatory Visit: Payer: Self-pay | Admitting: Family Medicine

## 2024-07-19 ENCOUNTER — Encounter: Payer: Self-pay | Admitting: Radiology

## 2024-07-20 ENCOUNTER — Ambulatory Visit

## 2024-07-21 NOTE — Therapy (Incomplete)
 OUTPATIENT PHYSICAL THERAPY NEURO TREATMENT  Patient Name: Dana [name] MRN: [id number] DOB:October 08, 1965, 58 y.o., female Today's Date: 07/21/2024   PCP: Garnette Olmsted REFERRING PROVIDER: Garnette Olmsted  END OF SESSION:                  Past Medical History:  Diagnosis Date   Anxiety    Back pain with radiation    Breast mass, right    Chronic pain    Chronic, continuous use of opioids    Complication of anesthesia 04/07/14   Allergic reaction to Lisinopril  immediately following surgery   DJD (degenerative joint disease)    Fibromyalgia    Groin abscess    Headache(784.0)    History of IBS    Hypercholesterolemia    IBS (irritable bowel syndrome)    Lactose intolerance    Mild hypertension    Obesity    Tobacco use disorder    Umbilical hernia    Symptomatic   Past Surgical History:  Procedure Laterality Date   ABDOMINAL HYSTERECTOMY     ANTERIOR CERVICAL DECOMP/DISCECTOMY FUSION N/A 12/20/2015   Procedure: ANTERIOR CERVICAL DECOMPRESSION FUSION CERVICAL 4-5, CERVICAL 5-6, CERVICAL 6-7 WITH INSTRUMENTATION AND ALLOGRAFT;  Surgeon: Oneil Priestly, MD;  Location: [facility] OR;  Service: Orthopedics;  Laterality: N/A;  Anterior cervical decompression fusion, cervical 4-5, cervical 5-6, cervical 6-7 with instrumentation and allograft   BACK SURGERY     BILATERAL SALPINGECTOMY  09/03/2012   Procedure: BILATERAL SALPINGECTOMY;  Surgeon: Curlee VEAR Guan, MD;  Location: [facility];  Service: Gynecology;  Laterality: Bilateral;   BREAST BIOPSY Right 04/07/2014   Procedure: REMOVAL RIGHT BREAST MASS WITH WIRE LOCALIZATION;  Surgeon: Krystal JINNY Russell, MD;  Location: [facility] OR;  Service: General;  Laterality: Right;   BREAST EXCISIONAL BIOPSY Left    x2   BREAST LUMPECTOMY     x2   CARPAL TUNNEL RELEASE Left 05/11/2019   Procedure: LEFT CARPAL TUNNEL RELEASE, RIGHT TENNIS ELBOW MARCAINE /DEPO MEDROL  INJECTION UNDER ANESTHESIA;  Surgeon: Lucilla Lynwood BRAVO, MD;  Location: [facility] OR;  Service:  Orthopedics;  Laterality: Left;   COLONOSCOPY W/ BIOPSIES  04/24/2012   per Dr. Avram, clear, repeat in 10 yrs    disectomy     ESOPHAGOGASTRODUODENOSCOPY     FINGER SURGERY     Right index-excision of mass    FOOT SURGERY Right    Bone Spurs   IR FLUORO GUIDE CV LINE RIGHT  01/29/2023   IR REMOVAL TUN CV CATH W/O FL  02/08/2023   IR US  GUIDE VASC ACCESS RIGHT  01/31/2023   LAPAROSCOPIC HYSTERECTOMY  09/03/2012   Procedure: HYSTERECTOMY TOTAL LAPAROSCOPIC;  Surgeon: Curlee VEAR Guan, MD;  Location: [facility];  Service: Gynecology;  Laterality: N/A;   LUMBAR DISC SURGERY     TUBAL LIGATION     UMBILICAL HERNIA REPAIR N/A 05/01/2018   Procedure: UMBILICAL HERNIA REPAIR;  Surgeon: Kimble Lynwood, MD;  Location: [facility] OR;  Service: General;  Laterality: N/A;   Patient Active Problem List   Diagnosis Date Noted   Edema 11/20/2023   Hyperkeratosis of sole 08/01/2023   CIDP (chronic inflammatory demyelinating polyneuropathy) (HCC) 01/28/2023   Gait abnormality 01/28/2023   Glaucoma 01/28/2023   Pain due to onychomycosis of toenails of both feet 01/21/2023   Wheelchair dependence 12/30/2022   Neuropathic pain 12/30/2022   Insomnia due to medical condition 12/30/2022   Acute inflammatory demyelinating polyneuropathy 10/01/2022   UTI (urinary tract infection) 09/24/2022   Hypomagnesemia 09/24/2022   Weakness  09/23/2022   Hypokalemia 09/23/2022   B12 deficiency 09/05/2022   Folate deficiency 09/05/2022   Carpal tunnel syndrome, left upper limb 05/11/2019    Class: Chronic   Spinal stenosis of lumbar region with neurogenic claudication 08/20/2018   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   Primary osteoarthritis of right knee 02/27/2017   Right tennis elbow 11/11/2016   Muscle cramps 01/15/2016   Bilateral leg edema 01/08/2016   Radiculopathy 12/20/2015   Hyperglycemia 12/21/2014   Mastodynia, female 11/30/2014   S/P excision of fibroadenoma of breast 11/30/2014   Angioedema of lips  04/07/2014   Fibroadenoma of right breast 03/07/2014   Pelvic pain in female 06/19/2012   Menorrhagia 06/11/2012   SUI (stress urinary incontinence, female) 06/11/2012   HTN (hypertension) 06/11/2012   IBS (irritable bowel syndrome) - diarrhea predominant 03/17/2012   MICROSCOPIC HEMATURIA 11/30/2010   BREAST PAIN, RIGHT 11/30/2010   BREAST MASS, RIGHT 01/12/2010   HYPERCHOLESTEROLEMIA 12/07/2007   CIGARETTE SMOKER 12/07/2007   Osteoarthritis 12/07/2007   Low back pain with sciatica 12/07/2007   Headache 12/07/2007   Obesity, Class III, BMI 40-49.9 (morbid obesity) (HCC) 08/03/2007   Anxiety state 08/03/2007    ONSET DATE: 12/05/22  REFERRING DIAG:  G89.4 (ICD-10-CM) - Chronic pain syndrome    THERAPY DIAG:  No diagnosis found.  Rationale for Evaluation and Treatment: Rehabilitation  SUBJECTIVE:                                                                                                                                                                                             SUBJECTIVE STATEMENT: I had my eye surgeries and I just have not been good. I was able to walk a few feet in the house into the kitchen without the walker. I also had surgery for an ingrown toenail and it still hurts.   PERTINENT HISTORY: Pt Continuation for PT due to chronic inflammatory demyelinating polyneuropathy   01/28/23 ASSESSMENT AND PLAN   Dana [name] is a 58 y.o. female   Demyelinating polyradiculoneuropathy             Symptom onset monophasic since September 2024, failed to respond to IVIG in January 2024,             EMG nerve conduction study showed demyelinating features, no evidence of axonal loss,             Talk with neurohospitalist Dr. Jerrie, ED triage, will send her for hospital admission for plasma exchange, please add on following labs, immunofixative protein electrophoresis, ANA with reflex, SSA, SSB titer, iron panel  Starting outpatient IVIG prior  authorization process,   Dana [name] is a 58 y.o. female who presented to the [facility] ED on 09/23/2022 with bilateral lower extremity weakness with decreased mobility that has progressed to both arms. She also reported numbness and tingling of the extremities. She was transferred to [facility] for MRI. 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  Inpatient Rehab F/B HHPT.   R knee injection 10/08/22 trigger point injections for worsening back pain 2/15  RA  PAIN:  Are you having pain? No  PRECAUTIONS: None  RED FLAGS: None   WEIGHT BEARING RESTRICTIONS: No  FALLS: Has patient fallen in last 6 months? No  LIVING ENVIRONMENT: Lives with: lives with their family Lives in: House/apartment Stairs: No Has following equipment at home: Environmental Consultant - 2 wheeled and Wheelchair (power)  PLOF: Independent and Independent with basic ADLs  PATIENT GOALS: I want to get out this chair and put it down in the basement   OBJECTIVE:  Note: Objective measures were completed at Evaluation unless otherwise noted.  DIAGNOSTIC FINDINGS: Right knee radiographs 11/09/2020   FINDINGS: Severe patellofemoral joint space narrowing. Severe superior and mild inferior patellar degenerative osteophytosis. Moderate superior trochlear degenerative osteophytosis. No joint effusion. Severe medial compartment joint space narrowing with moderate peripheral medial and lateral compartment degenerative osteophytes.   COGNITION: Overall cognitive status: Within functional limits for tasks assessed   SENSATION: Light touch: still has some numbness in L thigh down into the knee, toes are also numb a little bit   EDEMA: some BLE edema   POSTURE: rounded shoulders and forward head  LOWER EXTREMITY ROM:  grossly WFL for her means, able to move better than last time she was in PT    LOWER EXTREMITY MMT:    MMT Right Eval Left Eval  Hip flexion 4 4  Hip extension    Hip abduction 4 4  Hip  adduction 4 4  Hip internal rotation    Hip external rotation    Knee flexion 4 4-  Knee extension 4- 4-  Ankle dorsiflexion    Ankle plantarflexion    Ankle inversion    Ankle eversion    (Blank rows = not tested)   TRANSFERS: Assistive device utilized: Environmental [employer]ant - 2 wheeled and Wheelchair (power)  Sit to stand: Modified independence Stand to sit: Complete Independence Chair to chair: Modified independence  GAIT: Gait pattern: step through pattern, decreased stride length, wide BOS, poor foot clearance- Right, and poor foot clearance- Left Distance walked: in clinic distances Assistive device utilized: Environmental [employer]ant - 2 wheeled Level of assistance: Modified independence Comments: she was able to take a few steps without the walker at eval  FUNCTIONAL TESTS:  5 times sit to stand: 13.14s from elevated mat table  Timed up and go (TUG): 29s with walker  TREATMENT DATE:  07/22/24 NuStep or walking laps Taps to 6 with 3# ankle weights  Hip abd 3#  [employer]  STS with chest press  06/29/24 NuStep L5x  Standing rows and ext green 2x10 Walking with walker 262ft, 170ft, 116ft  Standing shoulder flexion with 3# WaTE x10   06/22/24 TUG 22s with RW Walking laps 275ft STS 2x10 Walking to NuStep without walker  NuStep L5x34mins stopped due to pain in toe  4# chest press 2x10 2# OHP 2x10  05/27/24 Standing rows and ext green 2x10 LAQ 3# 2x10 HS curls green 2x10 Ball squeeze 2x10 NuStep L5 x58min   05/20/24 RECERT NuStep [id number]  STS 2x10 Side steps in front of mat table 2 HHA Standing alt punches with 3#  4 taps Up and down 4 steps   05/18/24 Recheck goals again Walk from table to Nustep 1HHA Nustep L5 x27mins  Walk back from NuStep to table no assistance  Standing march with walker  STS 2x10 Chest press to OHP with yellow ball  2x10   04/22/24 Walking with walker ~171ft  NuStep L5x69mins  LAQ 5# 2x10 Green band HS curls 2x10 Shoulder ext and rows green 2x10   04/13/24 Recheck goals  Side steps along mat 2HHA OHP 2# 2x10 Alternating punches 2# 2x10 NuStep L5 x61mins  Walking with walker 1 big lap   03/18/24 OHP yellow ball x10 Seated horizontal abd red 2x10 Seated box taps 3# 2x10, then laterally  Ball squeeze 2x10 Seated march  Transfer from chair to mat and back  03/09/24 Recheck goals and recert Walking with walker 1 lap 144ft x2 5# LAQ and marching STS 2x10 NuStep L5x66mins    02/12/24 TUG with walker  NuStep L5x23mins  Seated calf to toe raises 2x12 STS 2x10 Box taps 4  Walking 1 lap with walker ~159ft   02/05/24 Transfer from power chair <> mat  LAQ 2x10 HS curls green 2x10 Seated chest press with 2# WATE 2x10 Seated AAROM 2# WATE shoulder flexion 2x10 Ball squeeze 2x10      01/20/24 Recheck 5xSTS goal Fitter pushes 2x10 STS with chest press 2# 2x8 Walking ~14ft from table to NuStep w/o AD NuStep L5x44mins Taps to side of treadmill 20 reps alt  Walk from treadmill back to mat table without AD   01/08/24 Taking steps in parallel bars without UE use- forwards, backwards, side steps  Calf raises by steps 2x10  Box taps 6 holding on to railings  Walk across steps 2HRA but can alternate feet  NuStep L5x28mins    12/30/23 NuStep L5x26mins  Standing marching with RW Standing hip abd with RW 2x10 STS 2x10 Walking laps 148ft, 167ft    12/16/23- EVAL    PATIENT EDUCATION: Education details: POC, aquatic therapy  Person educated: Patient Education method: Explanation Education comprehension: verbalized understanding  HOME EXERCISE PROGRAM: [id number]   GOALS: Goals reviewed with patient? Yes  SHORT TERM GOALS: Target date: 01/20/24  Patient will be independent with initial HEP. Baseline:  Goal status: MET  2.  Patient will demonstrate improved functional LE strength as  demonstrated by 5xSTS <12s. Baseline: 13.14s from elevated mat table  Goal status: 8.75s from elevated table MET 01/20/24   LONG TERM GOALS: Target date: 08/12/24  Patient will be independent with advanced/ongoing HEP to improve outcomes and carryover.  Baseline:  Goal status: INITIAL  2.  Patient will be able to ambulate 500' with LRAD with good safety to access community.  Baseline: can walk  with RW Goal status: IN PROGRESS 03/09/24, ongoing 04/13/24, progressing 05/18/24, 200' 06/22/24  3.  Patient will demonstrate decreased fall risk by scoring < 20 sec on TUG. Baseline: 29s Goal status: ongoing 44s w/walker, 31s w/walker 03/09/24, 34s 04/13/24, 30s 05/18/24, 22s 06/22/24  4.  Patient will be able to walk 38ft or more with no AD Baseline: took 10 steps modI  Goal status: IN PROGRESS did 28ft 01/20/24, unable to do today 03/09/24, able to do 1HHA 04/13/24, MET 05/18/24  5. Patient will increase standing tolerance to 30 mins or longer to complete household and kitchen chores  Baseline: before needing to sit  Goal status: ongoing 10 mins 03/09/24, about 10 mins 04/13/24, 15-20 mins 05/18/24, no progress 06/29/24   ASSESSMENT:  CLINICAL IMPRESSION: Patient was unable to do the NuStep today for more than 2 mins due to cramp/spasm in her L abdominal. Because of the ongoing cramp she was limited some in session today. She also reports ongoing pain in the L toe. She is supposed to go Thursday for a follow up for this.    EVAL Patient is a 58 y.o. 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 She is currently still getting around in her power chair for the most part. She reports using the walker in her home, using it to get to the bathroom and in the kitchen. States her standing tolerance is about . Patient was able to take a few steps today without an assistive device. She has made  great improvements from the last time she was at our clinic. She expresses interest in doing aquatic therapy. We will set her up to do 1x land based and 1x a week aquatic therapy to be able to increase her standing and walking tolerance with and without an AD to improve her functional independence.   OBJECTIVE IMPAIRMENTS: Abnormal gait, decreased activity tolerance, decreased balance, decreased coordination, decreased endurance, difficulty walking, decreased ROM, decreased strength, postural dysfunction, and obesity.   ACTIVITY LIMITATIONS: carrying, lifting, squatting, stairs, transfers, and locomotion level  PARTICIPATION LIMITATIONS: cleaning, laundry, driving, shopping, and community activity  PERSONAL FACTORS: Fitness, Past/current experiences, Time since onset of injury/illness/exacerbation, and 1-2 comorbidities: chronic pain, GBS are also affecting patient's functional outcome.   REHAB POTENTIAL: Good  CLINICAL DECISION MAKING: Stable/uncomplicated  EVALUATION COMPLEXITY: Low  PLAN:  PT FREQUENCY: 1-2x/week  PT DURATION: 10 weeks  PLANNED INTERVENTIONS: 97110-Therapeutic exercises, 97530- Therapeutic activity, [id number]- Neuromuscular re-education, 97535- Self Care, [id number]- Manual therapy, [phone]- Gait training, [phone]- Aquatic Therapy, Patient/Family education, Balance training, and Stair training  PLAN FOR NEXT SESSION: start with any functional tasks and working on gait and balance. Endurance training   [employer], PT 07/21/2024, 2:07 PM

## 2024-07-22 ENCOUNTER — Other Ambulatory Visit: Payer: Self-pay | Admitting: Family Medicine

## 2024-07-22 ENCOUNTER — Ambulatory Visit

## 2024-07-22 MED ORDER — ALPRAZOLAM 0.5 MG PO TABS: 0.5000 mg | ORAL_TABLET | Freq: Two times a day (BID) | ORAL | 1 refills | Status: AC | PRN

## 2024-07-22 NOTE — Telephone Encounter (Signed)
 Done

## 2024-07-27 ENCOUNTER — Encounter: Payer: Self-pay | Admitting: Family Medicine

## 2024-07-27 ENCOUNTER — Ambulatory Visit: Payer: Self-pay

## 2024-07-27 ENCOUNTER — Telehealth (INDEPENDENT_AMBULATORY_CARE_PROVIDER_SITE_OTHER): Admitting: Family Medicine

## 2024-07-27 DIAGNOSIS — M79672 Pain in left foot: Secondary | ICD-10-CM | POA: Diagnosis not present

## 2024-07-27 NOTE — Progress Notes (Signed)
 Patient ID: Dana [name], female   DOB: 05/06/1966, 58 y.o.   MRN: [id number]   Virtual Visit via Video Note  I connected with Arland Mirza on 07/27/24 at  4:00 PM EST by a video enabled telemedicine application and verified that I am speaking with the correct person using two identifiers.  Location patient: home Location provider:work or home office Persons participating in the virtual visit: patient, provider  I discussed the limitations of evaluation and management by telemedicine and the availability of in person appointments. The patient expressed understanding and agreed to proceed.   HPI: Ms. Gradel was seen with left foot pain which started this past Friday.  She denies any injury.  No history of similar foot pain in the past.  She states her whole foot hurts.  No localized pain.  Denies any recent change of shoewear.  She has not noted any warmth or erythema.  No history of gout.  No major color changes.  Has not noted any cold foot issues.  She does have a reported history of Guillain- Barre syndrome and CIDP and takes gabapentin  regularly.  She does folic her foot may be slightly swollen.  Denies any calf pain or lower leg pain.  No recent fever or cellulitis changes.  No history of reported diabetes.   ROS: See pertinent positives and negatives per HPI.  Past Medical History:  Diagnosis Date   Anxiety    Back pain with radiation    Breast mass, right    Chronic pain    Chronic, continuous use of opioids    Complication of anesthesia 04/07/14   Allergic reaction to Lisinopril  immediately following surgery   DJD (degenerative joint disease)    Fibromyalgia    Groin abscess    Headache(784.0)    History of IBS    Hypercholesterolemia    IBS (irritable bowel syndrome)    Lactose intolerance    Mild hypertension    Obesity    Tobacco use disorder    Umbilical hernia    Symptomatic    Past Surgical History:  Procedure Laterality Date   ABDOMINAL HYSTERECTOMY      ANTERIOR CERVICAL DECOMP/DISCECTOMY FUSION N/A 12/20/2015   Procedure: ANTERIOR CERVICAL DECOMPRESSION FUSION CERVICAL 4-5, CERVICAL 5-6, CERVICAL 6-7 WITH INSTRUMENTATION AND ALLOGRAFT;  Surgeon: Oneil Priestly, MD;  Location: [facility] OR;  Service: Orthopedics;  Laterality: N/A;  Anterior cervical decompression fusion, cervical 4-5, cervical 5-6, cervical 6-7 with instrumentation and allograft   BACK SURGERY     BILATERAL SALPINGECTOMY  09/03/2012   Procedure: BILATERAL SALPINGECTOMY;  Surgeon: Curlee VEAR Guan, MD;  Location: [facility];  Service: Gynecology;  Laterality: Bilateral;   BREAST BIOPSY Right 04/07/2014   Procedure: REMOVAL RIGHT BREAST MASS WITH WIRE LOCALIZATION;  Surgeon: Krystal JINNY Russell, MD;  Location: [facility] OR;  Service: General;  Laterality: Right;   BREAST EXCISIONAL BIOPSY Left    x2   BREAST LUMPECTOMY     x2   CARPAL TUNNEL RELEASE Left 05/11/2019   Procedure: LEFT CARPAL TUNNEL RELEASE, RIGHT TENNIS ELBOW MARCAINE /DEPO MEDROL  INJECTION UNDER ANESTHESIA;  Surgeon: Lucilla Lynwood BRAVO, MD;  Location: [facility] OR;  Service: Orthopedics;  Laterality: Left;   COLONOSCOPY W/ BIOPSIES  04/24/2012   per Dr. Avram, clear, repeat in 10 yrs    disectomy     ESOPHAGOGASTRODUODENOSCOPY     FINGER SURGERY     Right index-excision of mass    FOOT SURGERY Right    Bone Spurs   IR FLUORO GUIDE  CV LINE RIGHT  01/29/2023   IR REMOVAL TUN CV CATH W/O FL  02/08/2023   IR US  GUIDE VASC ACCESS RIGHT  01/31/2023   LAPAROSCOPIC HYSTERECTOMY  09/03/2012   Procedure: HYSTERECTOMY TOTAL LAPAROSCOPIC;  Surgeon: Curlee VEAR Guan, MD;  Location: [facility];  Service: Gynecology;  Laterality: N/A;   LUMBAR DISC SURGERY     TUBAL LIGATION     UMBILICAL HERNIA REPAIR N/A 05/01/2018   Procedure: UMBILICAL HERNIA REPAIR;  Surgeon: Kimble Agent, MD;  Location: [facility] OR;  Service: General;  Laterality: N/A;    Family History  Problem Relation Age of Onset   Diabetes Mother    Diabetes Father    Breast cancer Maternal Aunt 46    Prostate cancer Paternal Grandfather     SOCIAL HX: Non-smoker   Current Outpatient Medications:    ALPRAZolam  (XANAX ) 0.5 MG tablet, Take 1 tablet (0.5 mg total) by mouth 2 (two) times daily as needed for anxiety., Disp: 180 tablet, Rfl: 1   ammonium lactate  (LAC-HYDRIN ) 12 % lotion, APPLY TOPICALLY IN THE MORNING AND AT BEDTIME, Disp: 400 mL, Rfl: 3   benzonatate  (TESSALON ) 200 MG capsule, TAKE 1 CAPSULE (200 MG TOTAL) BY MOUTH EVERY 6 (SIX) HOURS AS NEEDED FOR COUGH., Disp: 60 capsule, Rfl: 3   celecoxib  (CELEBREX ) 100 MG capsule, Take 1 capsule (100 mg total) by mouth 2 (two) times daily., Disp: 180 capsule, Rfl: 3   cephALEXin  (KEFLEX ) 500 MG capsule, TAKE 1 CAPSULE BY MOUTH FOUR TIMES A DAY, Disp: 40 capsule, Rfl: 0   Cyanocobalamin  (VITAMIN B-12 PO), Take 1 tablet by mouth daily., Disp: , Rfl:    cyclobenzaprine  (FLEXERIL ) 10 MG tablet, Take 1 tablet (10 mg total) by mouth 3 (three) times daily as needed for muscle spasms., Disp: 90 tablet, Rfl: 5   dorzolamide -timolol  (COSOPT ) 2-0.5 % ophthalmic solution, Place 1 drop into both eyes 2 (two) times daily., Disp: 10 mL, Rfl: 1   DULoxetine  (CYMBALTA ) 60 MG capsule, TAKE 1 CAPSULE BY MOUTH EVERY DAY, Disp: 90 capsule, Rfl: 4   famotidine  (PEPCID ) 20 MG tablet, Take 20 mg by mouth 2 (two) times daily., Disp: , Rfl:    fluticasone  (FLONASE ) 50 MCG/ACT nasal spray, SPRAY 2 SPRAYS INTO EACH NOSTRIL EVERY DAY, Disp: 16 mL, Rfl: 11   folic acid  (FOLVITE ) 1 MG tablet, TAKE 1 TABLET BY MOUTH EVERY DAY, Disp: 90 tablet, Rfl: 3   furosemide  (LASIX ) 40 MG tablet, TAKE 1 TABLET BY MOUTH TWICE A DAY, Disp: 180 tablet, Rfl: 3   gabapentin  (NEURONTIN ) 300 MG capsule, Take 3 capsules (900 mg total) by mouth 3 (three) times daily., Disp: 270 capsule, Rfl: 5   hydrocerin (EUCERIN) CREA, Apply 1 Application topically 2 (two) times daily., Disp: , Rfl: 0   ibuprofen  (ADVIL ) 200 MG tablet, Take 400 mg by mouth 2 (two) times daily as needed for headache or  moderate pain., Disp: , Rfl:    Immune Globulin , Human, (OCTAGAM IV), Inject 85 g into the vein every 21 ( twenty-one) days. 10% octagam, Disp: , Rfl:    latanoprost  (XALATAN ) 0.005 % ophthalmic solution, Place 1 drop into both eyes at bedtime., Disp: 2.5 mL, Rfl: 1   magnesium  oxide (MAG-OX) 400 MG tablet, Take 1 tablet (400 mg total) by mouth at bedtime. (Patient taking differently: Take 200 mg by mouth at bedtime.), Disp: 90 tablet, Rfl: 3   metoprolol  tartrate (LOPRESSOR ) 50 MG tablet, TAKE 1 TABLET BY MOUTH TWICE A DAY, Disp: 180 tablet, Rfl:  3   Multiple Vitamin (MULTIVITAMIN WITH MINERALS) TABS tablet, Take 1 tablet by mouth daily., Disp: 30 tablet, Rfl: 0   Oxycodone  HCl 20 MG TABS, Take 1 tablet (20 mg total) by mouth every 6 (six) hours as needed (pain)., Disp: 120 tablet, Rfl: 0   Oxycodone  HCl 20 MG TABS, Take 1 tablet (20 mg total) by mouth every 6 (six) hours as needed (pain)., Disp: 120 tablet, Rfl: 0   Oxycodone  HCl 20 MG TABS, Take 1 tablet (20 mg total) by mouth every 6 (six) hours as needed (pain)., Disp: 120 tablet, Rfl: 0   Oxycodone  HCl 20 MG TABS, Take 1 tablet (20 mg total) by mouth every 6 (six) hours as needed (pain)., Disp: 120 tablet, Rfl: 0   pantoprazole  (PROTONIX ) 40 MG tablet, TAKE 1 TABLET BY MOUTH EVERY DAY, Disp: 90 tablet, Rfl: 3   potassium chloride  (KLOR-CON ) 10 MEQ tablet, TAKE 1 TABLET BY MOUTH TWICE A DAY, Disp: 180 tablet, Rfl: 2   promethazine  (PHENERGAN ) 25 MG tablet, TAKE 1 TABLET (25 MG TOTAL) BY MOUTH EVERY 4 HOURS AS NEEDED FOR NAUSEA AND VOMITING, Disp: 60 tablet, Rfl: 5   senna-docusate (SENOKOT-S) 8.6-50 MG tablet, Take 1 tablet by mouth 2 (two) times daily. (Patient taking differently: Take 1 tablet by mouth 2 (two) times daily as needed for moderate constipation.), Disp: , Rfl:    traZODone  (DESYREL ) 50 MG tablet, TAKE 1 TO 2 TABLETS BY MOUTH AT BEDTIME AS NEEDED FOR SLEEP, Disp: 180 tablet, Rfl: 1  EXAM:  VITALS per patient if  applicable:  GENERAL: alert, oriented, appears well and in no acute distress  HEENT: atraumatic, conjunttiva clear, no obvious abnormalities on inspection of external nose and ears  NECK: normal movements of the head and neck  LUNGS: on inspection no signs of respiratory distress, breathing rate appears normal, no obvious gross SOB, gasping or wheezing  CV: no obvious cyanosis  MS: moves all visible extremities without noticeable abnormality  PSYCH/NEURO: pleasant and cooperative, no obvious depression or anxiety, speech and thought processing grossly intact  ASSESSMENT AND PLAN:  Discussed the following assessment and plan:  Left foot pain.  Started a few days ago.  Difficult to assess virtually.  We explained that she really need to be seen in person and may need x-rays and further evaluation.  She is not describing any erythema or warmth.  No history of gout.  No known history of injury.  Does not ambulate a lot at baseline but has had pain since Friday ambulating with walker.  Suspect she will need x-ray and further evaluation.  She has transportation issues.  We offered to order x-ray for tomorrow but she states she has infusions the next 2 days.  She does agree to office follow-up with primary or myself if necessary on Friday.     I discussed the assessment and treatment plan with the patient. The patient was provided an opportunity to ask questions and all were answered. The patient agreed with the plan and demonstrated an understanding of the instructions.   The patient was advised to call back or seek an in-person evaluation if the symptoms worsen or if the condition fails to improve as anticipated.     Wolm Scarlet, MD

## 2024-07-27 NOTE — Telephone Encounter (Signed)
 FYI Only or Action Required?: FYI only for provider: appointment scheduled on 07/27/2024.  Patient was last seen in primary care on 06/15/2024 by Johnny Garnette LABOR, MD.  Called Nurse Triage reporting Foot Pain.  Symptoms began several days ago.  Interventions attempted: Other: Epsom salt soak.  Symptoms are: gradually worsening.  Triage Disposition: See HCP Within 4 Hours (Or PCP Triage)  Patient/caregiver understands and will follow disposition?: Yes      Copied from CRM [id number]. Topic: Clinical - Red Word Triage >> Jul 27, 2024  3:19 PM Deaijah H wrote: Red Word that prompted transfer to Nurse Triage: Trouble with left foot. Extreme pain cannot stand on it. Swollen. Reason for Disposition  [1] SEVERE pain (e.g., excruciating, unable to do any normal activities) AND [2] not improved after 2 hours of pain medicine  Answer Assessment - Initial Assessment Questions 1. ONSET: When did the pain start?      Friday  2. LOCATION: Where is the pain located?      L foot  3. PAIN: How bad is the pain?    (Scale 1-10; or mild, moderate, severe)     10  4. OTHER SYMPTOMS: Do you have any other symptoms? (e.g., leg pain, rash, fever, numbness)     Swelling   Pt states she can not put pressure on the foot. Used an epsom salt soak for symptoms. Pt requested a virtual appointment with office.  Protocols used: Foot Pain-A-AH

## 2024-07-28 ENCOUNTER — Telehealth: Payer: Self-pay | Admitting: Neurology

## 2024-07-28 NOTE — Telephone Encounter (Signed)
 Noted

## 2024-07-28 NOTE — Telephone Encounter (Signed)
 Pt's husband called stating that the pt has been having issues with her legs where she can barley put pressure on them to stand and walk. Please advise.

## 2024-07-28 NOTE — Telephone Encounter (Signed)
 Agree to evaluate her left foot and ankle,   Less likely related to GBS,   She needs a follow up with our office in 1-3 months,  ok to see NP

## 2024-07-28 NOTE — Telephone Encounter (Signed)
 Call to husband who reports swollen in left foot since Friday, unable to pressure on it, she reports pain and says it feels like its gonna pop. She  denies it weeping. She denies any injury to the area. She reports the area confined to left foot/ankle/bottom of her  foot. She reports it feeling more numb than usual.She says it doesn't effect her calf. She does report that the  swollen area feels slightly warmer than the other area. I advised on ER/urgent care but she wants to makes ure this is not GB related. I advised I would send to Dr. Onita to review.

## 2024-07-29 NOTE — Telephone Encounter (Signed)
 Pt called, she accepted Dr Georgianne next available appointment and is on wait list.

## 2024-07-30 ENCOUNTER — Ambulatory Visit: Admitting: Family Medicine

## 2024-08-17 ENCOUNTER — Ambulatory Visit: Admitting: Physical Therapy

## 2024-08-19 ENCOUNTER — Ambulatory Visit

## 2024-08-20 ENCOUNTER — Telehealth: Payer: Self-pay

## 2024-08-20 NOTE — Telephone Encounter (Signed)
 Agent will let pt know waiting on md

## 2024-08-20 NOTE — Telephone Encounter (Signed)
 We spoke to the pharmacist, and they will fill a RX for this that was pending from July

## 2024-08-20 NOTE — Telephone Encounter (Signed)
 Copied from CRM [id number]. Topic: Clinical - Medication Question >> Aug 20, 2024 12:12 PM Dana [name] wrote: Reason for CRM: [name] with [facility] call in stating that she needs verbal confirmation that the patient is OK to receive an early fill of her Oxycodone  HCl 20 MG TABS. [name] stated this can be left with a voice message as long as a name is stated. Callback number [phone].

## 2024-08-20 NOTE — Telephone Encounter (Signed)
 Agent will let pt know the below message and md is gone for today

## 2024-08-24 ENCOUNTER — Ambulatory Visit: Attending: Family Medicine | Admitting: Physical Therapy

## 2024-08-26 ENCOUNTER — Ambulatory Visit: Attending: Family Medicine

## 2024-09-02 ENCOUNTER — Ambulatory Visit: Admitting: Surgical

## 2024-09-10 ENCOUNTER — Other Ambulatory Visit: Payer: Self-pay | Admitting: Family Medicine

## 2024-09-13 ENCOUNTER — Telehealth: Payer: Self-pay | Admitting: Family Medicine

## 2024-09-13 NOTE — Telephone Encounter (Signed)
 Copied from CRM [id number]. Topic: Referral - Question >> Sep 13, 2024 11:34 AM Jayma L wrote: Reason for CRM: patient called in stated she needs for her physical therapy referral to be extended, said she goes to [facility], said her PCP knows where she goes and she needs to have it extended so she can keep going.   If you need to reach the patient , call her at [phone]

## 2024-09-14 NOTE — Telephone Encounter (Signed)
 Please see if we can extend her PT a little longer

## 2024-09-15 ENCOUNTER — Other Ambulatory Visit: Payer: Self-pay

## 2024-09-15 DIAGNOSIS — G610 Guillain-Barre syndrome: Secondary | ICD-10-CM

## 2024-09-15 DIAGNOSIS — G6181 Chronic inflammatory demyelinating polyneuritis: Secondary | ICD-10-CM

## 2024-09-15 DIAGNOSIS — G8929 Other chronic pain: Secondary | ICD-10-CM

## 2024-09-15 DIAGNOSIS — Z993 Dependence on wheelchair: Secondary | ICD-10-CM

## 2024-09-15 NOTE — Telephone Encounter (Signed)
 Spoke with [facility] rehab, advise of pt physical extension per Dr Johnny, new referral faxed successfully per the rehab agent. Pt notified

## 2024-09-17 ENCOUNTER — Telehealth: Payer: Self-pay

## 2024-09-17 NOTE — Telephone Encounter (Signed)
 I do not perform Functional Capacity Evaluations. I suggest she ask her Physical Medicine Rehab provider, Dr. Duwaine Bare for this

## 2024-09-17 NOTE — Telephone Encounter (Signed)
 Attempted to call [place] at [facility] rehabilitation to see if they do Medical evaluation for electric scooter per Dr Johnny, Office closed will try back on Monday

## 2024-09-17 NOTE — Telephone Encounter (Signed)
 Copied from CRM #[id number]. Topic: Clinical - Order For Equipment >> Sep 15, 2024  3:03 PM China J wrote: Reason for CRM: Marijo calling from [facility] is wanted to request a arts development officer for the patient.  Vendor Info:  [employer] Fax: [phone] Follow Up Number: [phone]  Order has to be signed by PCP with letter of medical necessity, clinical notes, demographics, and functional mobility evaluation.

## 2024-09-17 NOTE — Telephone Encounter (Signed)
 I understand. She is currently working with [facility] at [facility]. Please call and make sure that they can do the FCE before I put in a referral

## 2024-09-17 NOTE — Telephone Encounter (Signed)
" °  FYI Spoke with pt advised of Dr Johnny recommendation pt stated that she nolonger see Physical Medicine Rehab provider, Dr. Duwaine Bare and that [facility] advised pt to request Rx for electric scooter from her PCP. Pt  has an evaluation with the Physical Therapy with [facility]. Please advise "

## 2024-09-22 ENCOUNTER — Telehealth: Payer: Self-pay | Admitting: *Deleted

## 2024-09-22 NOTE — Telephone Encounter (Signed)
 Copied from CRM [id number]. Topic: Clinical - Red Word Triage >> Sep 22, 2024  1:16 PM Rea ORN wrote: Red Word that prompted transfer to Nurse Triage: sore throat, cold feet-numbness/ tingling. Pt stated she wants an abx, her fathers funeral is on Monday and she doesn't want to be sick and miss it. >> Sep 22, 2024  1:41 PM Alexandria E wrote: Patient returning call. Relayed that I could get her back over to NT, but patient stated she specifically wanted to speak to [place] and not the triage nurses. Please call patient back when available. >> Sep 22, 2024  1:24 PM Rea ORN wrote: Pt disconnected while holding for NT. I called back and it went straight to voicemail. Please call back, [phone]

## 2024-09-22 NOTE — Telephone Encounter (Signed)
 Spoke with pt scheduled for a VV on 09/23/24 per Dr Johnny

## 2024-09-22 NOTE — Telephone Encounter (Signed)
 Dana [name]

## 2024-09-22 NOTE — Telephone Encounter (Signed)
 Spoke with [employer] advised that pt need a letter of necessity and Functional mobility evaluation from her Physical therapy. Spoke with pt appointment for evaluation schedule for 10/07/24. Pt is aware to let her Therapist aware to fax the required information to [employer] for the [employer] scooter

## 2024-09-23 ENCOUNTER — Telehealth (INDEPENDENT_AMBULATORY_CARE_PROVIDER_SITE_OTHER): Admitting: Family Medicine

## 2024-09-23 ENCOUNTER — Telehealth: Payer: Self-pay

## 2024-09-23 ENCOUNTER — Encounter: Payer: Self-pay | Admitting: Family Medicine

## 2024-09-23 DIAGNOSIS — J019 Acute sinusitis, unspecified: Secondary | ICD-10-CM

## 2024-09-23 MED ORDER — LIDOCAINE VISCOUS HCL 2 % MT SOLN: 5.0000 mL | Freq: Four times a day (QID) | OROMUCOSAL | 0 refills | Status: DC | PRN

## 2024-09-23 MED ORDER — DOXYCYCLINE HYCLATE 100 MG PO TABS: 100.0000 mg | ORAL_TABLET | Freq: Two times a day (BID) | ORAL | 0 refills | Status: AC

## 2024-09-23 NOTE — Progress Notes (Signed)
 "  Subjective:    Patient ID: Dana [name], female    DOB: [birth year], 59 y.o.   MRN: [id number]  HPI Virtual Visit via Video Note  I connected with the patient on 09/23/2024 at 10:00 AM EST by a video enabled telemedicine application and verified that I am speaking with the correct person using two identifiers.  Location patient: home Location provider:work or home office Persons participating in the virtual visit: patient, provider  I discussed the limitations of evaluation and management by telemedicine and the availability of in person appointments. The patient expressed understanding and agreed to proceed.   HPI: Here for 3 days of fever, stuffy head, PND, and a dry cough. No body aches or NVD. Drinking fluids and taking Ibuprofen .    ROS: See pertinent positives and negatives per HPI.  Past Medical History:  Diagnosis Date   Anxiety    Back pain with radiation    Breast mass, right    Chronic pain    Chronic, continuous use of opioids    Complication of anesthesia 04/07/14   Allergic reaction to Lisinopril  immediately following surgery   DJD (degenerative joint disease)    Fibromyalgia    Groin abscess    Headache(784.0)    History of IBS    Hypercholesterolemia    IBS (irritable bowel syndrome)    Lactose intolerance    Mild hypertension    Obesity    Tobacco use disorder    Umbilical hernia    Symptomatic    Past Surgical History:  Procedure Laterality Date   ABDOMINAL HYSTERECTOMY     ANTERIOR CERVICAL DECOMP/DISCECTOMY FUSION N/A 12/20/2015   Procedure: ANTERIOR CERVICAL DECOMPRESSION FUSION CERVICAL 4-5, CERVICAL 5-6, CERVICAL 6-7 WITH INSTRUMENTATION AND ALLOGRAFT;  Surgeon: Oneil Priestly, MD;  Location: [facility];  Service: Orthopedics;  Laterality: N/A;  Anterior cervical decompression fusion, cervical 4-5, cervical 5-6, cervical 6-7 with instrumentation and allograft   BACK SURGERY     BILATERAL SALPINGECTOMY  09/03/2012   Procedure: BILATERAL  SALPINGECTOMY;  Surgeon: Curlee VEAR Guan, MD;  Location: [facility];  Service: Gynecology;  Laterality: Bilateral;   BREAST BIOPSY Right 04/07/2014   Procedure: REMOVAL RIGHT BREAST MASS WITH WIRE LOCALIZATION;  Surgeon: Krystal JINNY Russell, MD;  Location: [facility] OR;  Service: General;  Laterality: Right;   BREAST EXCISIONAL BIOPSY Left    x2   BREAST LUMPECTOMY     x2   CARPAL TUNNEL RELEASE Left 05/11/2019   Procedure: LEFT CARPAL TUNNEL RELEASE, RIGHT TENNIS ELBOW MARCAINE /DEPO MEDROL  INJECTION UNDER ANESTHESIA;  Surgeon: Lucilla Lynwood BRAVO, MD;  Location: [facility] OR;  Service: Orthopedics;  Laterality: Left;   COLONOSCOPY W/ BIOPSIES  04/24/2012   per Dr. Avram, clear, repeat in 10 yrs    disectomy     ESOPHAGOGASTRODUODENOSCOPY     FINGER SURGERY     Right index-excision of mass    FOOT SURGERY Right    Bone Spurs   IR FLUORO GUIDE CV LINE RIGHT  01/29/2023   IR REMOVAL TUN CV CATH W/O FL  02/08/2023   IR US  GUIDE VASC ACCESS RIGHT  01/31/2023   LAPAROSCOPIC HYSTERECTOMY  09/03/2012   Procedure: HYSTERECTOMY TOTAL LAPAROSCOPIC;  Surgeon: Curlee VEAR Guan, MD;  Location: [facility];  Service: Gynecology;  Laterality: N/A;   LUMBAR DISC SURGERY     TUBAL LIGATION     UMBILICAL HERNIA REPAIR N/A 05/01/2018   Procedure: UMBILICAL HERNIA REPAIR;  Surgeon: Kimble Lynwood, MD;  Location: [facility] OR;  Service: General;  Laterality: N/A;    Family History  Problem Relation Age of Onset   Diabetes Mother    Diabetes Father    Breast cancer Maternal Aunt 46   Prostate cancer Paternal Grandfather     Current Medications[1]  EXAM:  VITALS per patient if applicable:  GENERAL: alert, oriented, appears well and in no acute distress  HEENT: atraumatic, conjunttiva clear, no obvious abnormalities on inspection of external nose and ears  NECK: normal movements of the head and neck  LUNGS: on inspection no signs of respiratory distress, breathing rate appears normal, no obvious gross SOB, gasping or wheezing  CV:  no obvious cyanosis  MS: moves all visible extremities without noticeable abnormality  PSYCH/NEURO: pleasant and cooperative, no obvious depression or anxiety, speech and thought processing grossly intact  ASSESSMENT AND PLAN: Sinusitis, treat with 10 days of Doxycycline .  Garnette Olmsted, MD  Discussed the following assessment and plan:  No diagnosis found.     I discussed the assessment and treatment plan with the patient. The patient was provided an opportunity to ask questions and all were answered. The patient agreed with the plan and demonstrated an understanding of the instructions.   The patient was advised to call back or seek an in-person evaluation if the symptoms worsen or if the condition fails to improve as anticipated.      Review of Systems     Objective:   Physical Exam        Assessment & Plan:       [1]  Current Outpatient Medications:    ALPRAZolam  (XANAX ) 0.5 MG tablet, Take 1 tablet (0.5 mg total) by mouth 2 (two) times daily as needed for anxiety., Disp: 180 tablet, Rfl: 1   ammonium lactate  (LAC-HYDRIN ) 12 % lotion, APPLY TOPICALLY IN THE MORNING AND AT BEDTIME, Disp: 400 mL, Rfl: 3   benzonatate  (TESSALON ) 200 MG capsule, TAKE 1 CAPSULE (200 MG TOTAL) BY MOUTH EVERY 6 (SIX) HOURS AS NEEDED FOR COUGH., Disp: 60 capsule, Rfl: 3   celecoxib  (CELEBREX ) 100 MG capsule, Take 1 capsule (100 mg total) by mouth 2 (two) times daily., Disp: 180 capsule, Rfl: 3   cephALEXin  (KEFLEX ) 500 MG capsule, TAKE 1 CAPSULE BY MOUTH FOUR TIMES A DAY, Disp: 40 capsule, Rfl: 0   Cyanocobalamin  (VITAMIN B-12 PO), Take 1 tablet by mouth daily., Disp: , Rfl:    cyclobenzaprine  (FLEXERIL ) 10 MG tablet, Take 1 tablet (10 mg total) by mouth 3 (three) times daily as needed for muscle spasms., Disp: 90 tablet, Rfl: 5   dorzolamide -timolol  (COSOPT ) 2-0.5 % ophthalmic solution, Place 1 drop into both eyes 2 (two) times daily., Disp: 10 mL, Rfl: 1   DULoxetine  (CYMBALTA ) 60 MG  capsule, TAKE 1 CAPSULE BY MOUTH EVERY DAY, Disp: 90 capsule, Rfl: 4   famotidine  (PEPCID ) 20 MG tablet, Take 20 mg by mouth 2 (two) times daily., Disp: , Rfl:    fluticasone  (FLONASE ) 50 MCG/ACT nasal spray, SPRAY 2 SPRAYS INTO EACH NOSTRIL EVERY DAY, Disp: 16 mL, Rfl: 11   folic acid  (FOLVITE ) 1 MG tablet, TAKE 1 TABLET BY MOUTH EVERY DAY, Disp: 90 tablet, Rfl: 3   furosemide  (LASIX ) 40 MG tablet, TAKE 1 TABLET BY MOUTH TWICE A DAY, Disp: 180 tablet, Rfl: 3   gabapentin  (NEURONTIN ) 300 MG capsule, TAKE 3 CAPSULES BY MOUTH 3 TIMES DAILY., Disp: 270 capsule, Rfl: 5   hydrocerin (EUCERIN) CREA, Apply 1 Application topically 2 (two) times daily., Disp: , Rfl: 0   ibuprofen  (ADVIL )  200 MG tablet, Take 400 mg by mouth 2 (two) times daily as needed for headache or moderate pain., Disp: , Rfl:    Immune Globulin , Human, (OCTAGAM IV), Inject 85 g into the vein every 21 ( twenty-one) days. 10% octagam, Disp: , Rfl:    latanoprost  (XALATAN ) 0.005 % ophthalmic solution, Place 1 drop into both eyes at bedtime., Disp: 2.5 mL, Rfl: 1   magnesium  oxide (MAG-OX) 400 MG tablet, Take 1 tablet (400 mg total) by mouth at bedtime. (Patient taking differently: Take 200 mg by mouth at bedtime.), Disp: 90 tablet, Rfl: 3   metoprolol  tartrate (LOPRESSOR ) 50 MG tablet, TAKE 1 TABLET BY MOUTH TWICE A DAY, Disp: 180 tablet, Rfl: 3   Multiple Vitamin (MULTIVITAMIN WITH MINERALS) TABS tablet, Take 1 tablet by mouth daily., Disp: 30 tablet, Rfl: 0   Oxycodone  HCl 20 MG TABS, Take 1 tablet (20 mg total) by mouth every 6 (six) hours as needed (pain)., Disp: 120 tablet, Rfl: 0   Oxycodone  HCl 20 MG TABS, Take 1 tablet (20 mg total) by mouth every 6 (six) hours as needed (pain)., Disp: 120 tablet, Rfl: 0   Oxycodone  HCl 20 MG TABS, Take 1 tablet (20 mg total) by mouth every 6 (six) hours as needed (pain)., Disp: 120 tablet, Rfl: 0   Oxycodone  HCl 20 MG TABS, Take 1 tablet (20 mg total) by mouth every 6 (six) hours as needed (pain).,  Disp: 120 tablet, Rfl: 0   pantoprazole  (PROTONIX ) 40 MG tablet, TAKE 1 TABLET BY MOUTH EVERY DAY, Disp: 90 tablet, Rfl: 3   potassium chloride  (KLOR-CON ) 10 MEQ tablet, TAKE 1 TABLET BY MOUTH TWICE A DAY, Disp: 180 tablet, Rfl: 2   promethazine  (PHENERGAN ) 25 MG tablet, TAKE 1 TABLET (25 MG TOTAL) BY MOUTH EVERY 4 HOURS AS NEEDED FOR NAUSEA AND VOMITING, Disp: 60 tablet, Rfl: 5   senna-docusate (SENOKOT-S) 8.6-50 MG tablet, Take 1 tablet by mouth 2 (two) times daily. (Patient taking differently: Take 1 tablet by mouth 2 (two) times daily as needed for moderate constipation.), Disp: , Rfl:    traZODone  (DESYREL ) 50 MG tablet, TAKE 1 TO 2 TABLETS BY MOUTH AT BEDTIME AS NEEDED FOR SLEEP, Disp: 180 tablet, Rfl: 1  "

## 2024-09-23 NOTE — Telephone Encounter (Unsigned)
 Copied from CRM [id number]. Topic: Clinical - Prescription Issue >> Sep 23, 2024  3:57 PM Dana [name] wrote: Reason for CRM: patient would like magic mouthwash (lidocaine , diphenhydrAMINE , alum & mag hydroxide) suspension  sent to the [facility] located at  [address] [place], [place] [address]. The pharmacy the medication was sent to originally  doesn't carry medication.

## 2024-09-24 ENCOUNTER — Ambulatory Visit: Payer: Self-pay | Admitting: Family Medicine

## 2024-09-24 ENCOUNTER — Other Ambulatory Visit: Payer: Self-pay | Admitting: Family Medicine

## 2024-09-24 MED ORDER — LIDOCAINE VISCOUS HCL 2 % MT SOLN: 5.0000 mL | Freq: Four times a day (QID) | OROMUCOSAL | 0 refills | Status: AC | PRN

## 2024-09-24 NOTE — Telephone Encounter (Signed)
 FYI Only or Action Required?: Action required by provider: Pt needing magic mouthwash sent to [facility]. [facility] her. .  Patient was last seen in primary care on 09/23/2024 by Johnny Garnette LABOR, MD.  Called Nurse Triage reporting Medication Problem.   Triage Disposition: Call PCP When Office is Open  Patient/caregiver understands and will follow disposition?: Yes   Pt needing to have magic mouthwash sent to a different pharmacy after a telemedicine appt yesterday with PCP. [facility] but they don't [facility] up this med. She needs it sent to [facility] located at  [address] [place], [place], [place] [address] which mixes up this med. She sent request yesterday but hasn't heard back.   Copied from CRM [id number]. Topic: Clinical - Medication Question >> Sep 24, 2024  9:58 AM Tonda B wrote: Reason for CRM: patient has questions about rx magic mouthwash (lidocaine , diphenhydrAMINE , alum & mag hydroxide please call pt back [phone] Reason for Disposition  [1] Caller has NON-URGENT medicine question about med that doctor (or NP/PA) prescribed AND [2] triager unable to answer question  Answer Assessment - Initial Assessment Questions 1. DRUG NAME: What medicine do you need to have refilled?     Magic Mouthwash  2. REFILLS REMAINING: How many refills are remaining? Notes: The label on the medicine or pill bottle will show how many refills are remaining. If there are no refills remaining, then a renewal may be needed.     *No Answer*  3. EXPIRATION DATE: What is the expiration date? Note: The label states when the prescription will expire, and thus can no longer be refilled.)     *No Answer*  4. PRESCRIBER: Who prescribed it? Note: The prescribing doctor or group is responsible for refill approvals..     Dr. Garnette Johnny  5. PHARMACY: Have you contacted your pharmacy (drugstore)? Note: Some pharmacies will contact the doctor (or NP/PA).      Called  [facility] this morning and they don't have it yet  6. SYMPTOMS: Do you have any symptoms?     Sore mouth  Protocols used: Medication Refill and Renewal Call-A-AH

## 2024-09-24 NOTE — Progress Notes (Signed)
 Done

## 2024-09-24 NOTE — Telephone Encounter (Signed)
 Pt notified & voiced understanding.

## 2024-09-24 NOTE — Telephone Encounter (Signed)
 Done

## 2024-09-27 ENCOUNTER — Ambulatory Visit: Admitting: Surgical

## 2024-09-30 ENCOUNTER — Ambulatory Visit: Admitting: Surgical

## 2024-10-07 ENCOUNTER — Ambulatory Visit

## 2024-10-12 ENCOUNTER — Encounter: Payer: Self-pay | Admitting: Family Medicine

## 2024-10-12 ENCOUNTER — Ambulatory Visit: Admitting: Family Medicine

## 2024-10-12 ENCOUNTER — Telehealth: Admitting: Family Medicine

## 2024-10-12 DIAGNOSIS — G8929 Other chronic pain: Secondary | ICD-10-CM | POA: Diagnosis not present

## 2024-10-12 DIAGNOSIS — M5440 Lumbago with sciatica, unspecified side: Secondary | ICD-10-CM | POA: Diagnosis not present

## 2024-10-12 MED ORDER — OXYCODONE HCL 20 MG PO TABS: 20.0000 mg | ORAL_TABLET | Freq: Four times a day (QID) | ORAL | 0 refills | Status: AC | PRN

## 2024-10-12 NOTE — Progress Notes (Signed)
 "  Subjective:    Patient ID: Dana [name], female    DOB: 27-May-1966, 59 y.o.   MRN: [id number]  HPI Virtual Visit via Video Note  I connected with the patient on 10/12/24 at  1:15 PM EST by a video enabled telemedicine application and verified that I am speaking with the correct person using two identifiers.  Location patient: home Location provider:work or home office Persons participating in the virtual visit: patient, provider  I discussed the limitations of evaluation and management by telemedicine and the availability of in person appointments. The patient expressed understanding and agreed to proceed.   HPI: Here for pain management. She is doing well.    ROS: See pertinent positives and negatives per HPI.  Past Medical History:  Diagnosis Date   Anxiety    Back pain with radiation    Breast mass, right    Chronic pain    Chronic, continuous use of opioids    Complication of anesthesia 04/07/14   Allergic reaction to Lisinopril  immediately following surgery   DJD (degenerative joint disease)    Fibromyalgia    Groin abscess    Headache(784.0)    History of IBS    Hypercholesterolemia    IBS (irritable bowel syndrome)    Lactose intolerance    Mild hypertension    Obesity    Tobacco use disorder    Umbilical hernia    Symptomatic    Past Surgical History:  Procedure Laterality Date   ABDOMINAL HYSTERECTOMY     ANTERIOR CERVICAL DECOMP/DISCECTOMY FUSION N/A 12/20/2015   Procedure: ANTERIOR CERVICAL DECOMPRESSION FUSION CERVICAL 4-5, CERVICAL 5-6, CERVICAL 6-7 WITH INSTRUMENTATION AND ALLOGRAFT;  Surgeon: Oneil Priestly, MD;  Location: [facility] OR;  Service: Orthopedics;  Laterality: N/A;  Anterior cervical decompression fusion, cervical 4-5, cervical 5-6, cervical 6-7 with instrumentation and allograft   BACK SURGERY     BILATERAL SALPINGECTOMY  09/03/2012   Procedure: BILATERAL SALPINGECTOMY;  Surgeon: Curlee VEAR Guan, MD;  Location: [facility];  Service: Gynecology;   Laterality: Bilateral;   BREAST BIOPSY Right 04/07/2014   Procedure: REMOVAL RIGHT BREAST MASS WITH WIRE LOCALIZATION;  Surgeon: Krystal JINNY Russell, MD;  Location: [facility] OR;  Service: General;  Laterality: Right;   BREAST EXCISIONAL BIOPSY Left    x2   BREAST LUMPECTOMY     x2   CARPAL TUNNEL RELEASE Left 05/11/2019   Procedure: LEFT CARPAL TUNNEL RELEASE, RIGHT TENNIS ELBOW MARCAINE /DEPO MEDROL  INJECTION UNDER ANESTHESIA;  Surgeon: Lucilla Lynwood BRAVO, MD;  Location: [facility] OR;  Service: Orthopedics;  Laterality: Left;   COLONOSCOPY W/ BIOPSIES  04/24/2012   per Dr. Avram, clear, repeat in 10 yrs    disectomy     ESOPHAGOGASTRODUODENOSCOPY     FINGER SURGERY     Right index-excision of mass    FOOT SURGERY Right    Bone Spurs   IR FLUORO GUIDE CV LINE RIGHT  01/29/2023   IR REMOVAL TUN CV CATH W/O FL  02/08/2023   IR US  GUIDE VASC ACCESS RIGHT  01/31/2023   LAPAROSCOPIC HYSTERECTOMY  09/03/2012   Procedure: HYSTERECTOMY TOTAL LAPAROSCOPIC;  Surgeon: Curlee VEAR Guan, MD;  Location: [facility];  Service: Gynecology;  Laterality: N/A;   LUMBAR DISC SURGERY     TUBAL LIGATION     UMBILICAL HERNIA REPAIR N/A 05/01/2018   Procedure: UMBILICAL HERNIA REPAIR;  Surgeon: Kimble Lynwood, MD;  Location: [facility] OR;  Service: General;  Laterality: N/A;    Family History  Problem Relation Age of Onset  Diabetes Mother    Diabetes Father    Breast cancer Maternal Aunt 46   Prostate cancer Paternal Grandfather     Current Medications[1]  EXAM:  VITALS per patient if applicable:  GENERAL: alert, oriented, appears well and in no acute distress  HEENT: atraumatic, conjunttiva clear, no obvious abnormalities on inspection of external nose and ears  NECK: normal movements of the head and neck  LUNGS: on inspection no signs of respiratory distress, breathing rate appears normal, no obvious gross SOB, gasping or wheezing  CV: no obvious cyanosis  MS: moves all visible extremities without noticeable  abnormality  PSYCH/NEURO: pleasant and cooperative, no obvious depression or anxiety, speech and thought processing grossly intact  ASSESSMENT AND PLAN: Pain management. Indication for chronic opioid: low back pain Medication and dose: Oxycodone  20 mg # pills per month: 120 Last UDS date: 12-26-23 Opioid Treatment Agreement signed (Y/N): 12-29-17 Opioid Treatment Agreement last reviewed with patient:  10-13-24 [facility] reviewed this encounter (include red flags): Yes Meds were refilled.  Garnette Olmsted, MD  Discussed the following assessment and plan:  No diagnosis found.     I discussed the assessment and treatment plan with the patient. The patient was provided an opportunity to ask questions and all were answered. The patient agreed with the plan and demonstrated an understanding of the instructions.   The patient was advised to call back or seek an in-person evaluation if the symptoms worsen or if the condition fails to improve as anticipated.      Review of Systems     Objective:   Physical Exam        Assessment & Plan:       [1]  Current Outpatient Medications:    ALPRAZolam  (XANAX ) 0.5 MG tablet, Take 1 tablet (0.5 mg total) by mouth 2 (two) times daily as needed for anxiety., Disp: 180 tablet, Rfl: 1   ammonium lactate  (LAC-HYDRIN ) 12 % lotion, APPLY TOPICALLY IN THE MORNING AND AT BEDTIME, Disp: 400 mL, Rfl: 3   benzonatate  (TESSALON ) 200 MG capsule, TAKE 1 CAPSULE (200 MG TOTAL) BY MOUTH EVERY 6 (SIX) HOURS AS NEEDED FOR COUGH., Disp: 60 capsule, Rfl: 3   celecoxib  (CELEBREX ) 100 MG capsule, Take 1 capsule (100 mg total) by mouth 2 (two) times daily., Disp: 180 capsule, Rfl: 3   cephALEXin  (KEFLEX ) 500 MG capsule, TAKE 1 CAPSULE BY MOUTH FOUR TIMES A DAY, Disp: 40 capsule, Rfl: 0   Cyanocobalamin  (VITAMIN B-12 PO), Take 1 tablet by mouth daily., Disp: , Rfl:    cyclobenzaprine  (FLEXERIL ) 10 MG tablet, Take 1 tablet (10 mg total) by mouth 3 (three) times daily as  needed for muscle spasms., Disp: 90 tablet, Rfl: 5   dorzolamide -timolol  (COSOPT ) 2-0.5 % ophthalmic solution, Place 1 drop into both eyes 2 (two) times daily., Disp: 10 mL, Rfl: 1   doxycycline  (VIBRA -TABS) 100 MG tablet, Take 1 tablet (100 mg total) by mouth 2 (two) times daily., Disp: 20 tablet, Rfl: 0   DULoxetine  (CYMBALTA ) 60 MG capsule, TAKE 1 CAPSULE BY MOUTH EVERY DAY, Disp: 90 capsule, Rfl: 4   famotidine  (PEPCID ) 20 MG tablet, Take 20 mg by mouth 2 (two) times daily., Disp: , Rfl:    fluticasone  (FLONASE ) 50 MCG/ACT nasal spray, SPRAY 2 SPRAYS INTO EACH NOSTRIL EVERY DAY, Disp: 16 mL, Rfl: 11   folic acid  (FOLVITE ) 1 MG tablet, TAKE 1 TABLET BY MOUTH EVERY DAY, Disp: 90 tablet, Rfl: 3   furosemide  (LASIX ) 40 MG tablet, TAKE 1  TABLET BY MOUTH TWICE A DAY, Disp: 180 tablet, Rfl: 3   gabapentin  (NEURONTIN ) 300 MG capsule, TAKE 3 CAPSULES BY MOUTH 3 TIMES DAILY., Disp: 270 capsule, Rfl: 5   hydrocerin (EUCERIN) CREA, Apply 1 Application topically 2 (two) times daily., Disp: , Rfl: 0   ibuprofen  (ADVIL ) 200 MG tablet, Take 400 mg by mouth 2 (two) times daily as needed for headache or moderate pain., Disp: , Rfl:    Immune Globulin , Human, (OCTAGAM IV), Inject 85 g into the vein every 21 ( twenty-one) days. 10% octagam, Disp: , Rfl:    latanoprost  (XALATAN ) 0.005 % ophthalmic solution, Place 1 drop into both eyes at bedtime., Disp: 2.5 mL, Rfl: 1   magic mouthwash (lidocaine , diphenhydrAMINE , alum & mag hydroxide) suspension, Swish and spit 5 mLs 4 (four) times daily as needed for mouth pain., Disp: 360 mL, Rfl: 0   magnesium  oxide (MAG-OX) 400 MG tablet, Take 1 tablet (400 mg total) by mouth at bedtime. (Patient taking differently: Take 200 mg by mouth at bedtime.), Disp: 90 tablet, Rfl: 3   metoprolol  tartrate (LOPRESSOR ) 50 MG tablet, TAKE 1 TABLET BY MOUTH TWICE A DAY, Disp: 180 tablet, Rfl: 3   Multiple Vitamin (MULTIVITAMIN WITH MINERALS) TABS tablet, Take 1 tablet by mouth daily., Disp:  30 tablet, Rfl: 0   Oxycodone  HCl 20 MG TABS, Take 1 tablet (20 mg total) by mouth every 6 (six) hours as needed (pain)., Disp: 120 tablet, Rfl: 0   Oxycodone  HCl 20 MG TABS, Take 1 tablet (20 mg total) by mouth every 6 (six) hours as needed (pain)., Disp: 120 tablet, Rfl: 0   Oxycodone  HCl 20 MG TABS, Take 1 tablet (20 mg total) by mouth every 6 (six) hours as needed (pain)., Disp: 120 tablet, Rfl: 0   Oxycodone  HCl 20 MG TABS, Take 1 tablet (20 mg total) by mouth every 6 (six) hours as needed (pain)., Disp: 120 tablet, Rfl: 0   pantoprazole  (PROTONIX ) 40 MG tablet, TAKE 1 TABLET BY MOUTH EVERY DAY, Disp: 90 tablet, Rfl: 3   potassium chloride  (KLOR-CON ) 10 MEQ tablet, TAKE 1 TABLET BY MOUTH TWICE A DAY, Disp: 180 tablet, Rfl: 2   promethazine  (PHENERGAN ) 25 MG tablet, TAKE 1 TABLET (25 MG TOTAL) BY MOUTH EVERY 4 HOURS AS NEEDED FOR NAUSEA AND VOMITING, Disp: 60 tablet, Rfl: 5   senna-docusate (SENOKOT-S) 8.6-50 MG tablet, Take 1 tablet by mouth 2 (two) times daily. (Patient taking differently: Take 1 tablet by mouth 2 (two) times daily as needed for moderate constipation.), Disp: , Rfl:    traZODone  (DESYREL ) 50 MG tablet, TAKE 1 TO 2 TABLETS BY MOUTH AT BEDTIME AS NEEDED FOR SLEEP, Disp: 180 tablet, Rfl: 1  "

## 2024-10-15 ENCOUNTER — Ambulatory Visit: Admitting: Family Medicine

## 2024-10-21 ENCOUNTER — Ambulatory Visit: Admitting: Surgical

## 2024-10-21 DIAGNOSIS — M1712 Unilateral primary osteoarthritis, left knee: Secondary | ICD-10-CM

## 2024-10-21 DIAGNOSIS — M1711 Unilateral primary osteoarthritis, right knee: Secondary | ICD-10-CM

## 2024-10-26 ENCOUNTER — Ambulatory Visit

## 2024-11-22 ENCOUNTER — Ambulatory Visit: Admitting: Neurology

## 2024-12-13 ENCOUNTER — Ambulatory Visit: Admitting: Neurology

## 2025-06-01 ENCOUNTER — Ambulatory Visit
